# Patient Record
Sex: Male | Born: 1965
Health system: Southern US, Community
[De-identification: ages and names within clinical notes are randomized; demographics above are authoritative.]

## PROBLEM LIST (undated history)

## (undated) DIAGNOSIS — I10 Essential (primary) hypertension: Secondary | ICD-10-CM

## (undated) DIAGNOSIS — J189 Pneumonia, unspecified organism: Secondary | ICD-10-CM

## (undated) DIAGNOSIS — I499 Cardiac arrhythmia, unspecified: Secondary | ICD-10-CM

## (undated) DIAGNOSIS — I219 Acute myocardial infarction, unspecified: Secondary | ICD-10-CM

## (undated) DIAGNOSIS — E785 Hyperlipidemia, unspecified: Secondary | ICD-10-CM

## (undated) DIAGNOSIS — Z9289 Personal history of other medical treatment: Secondary | ICD-10-CM

## (undated) DIAGNOSIS — F419 Anxiety disorder, unspecified: Secondary | ICD-10-CM

## (undated) DIAGNOSIS — R569 Unspecified convulsions: Secondary | ICD-10-CM

## (undated) DIAGNOSIS — Z95810 Presence of automatic (implantable) cardiac defibrillator: Secondary | ICD-10-CM

## (undated) DIAGNOSIS — D649 Anemia, unspecified: Secondary | ICD-10-CM

## (undated) DIAGNOSIS — N189 Chronic kidney disease, unspecified: Secondary | ICD-10-CM

## (undated) DIAGNOSIS — I2510 Atherosclerotic heart disease of native coronary artery without angina pectoris: Secondary | ICD-10-CM

## (undated) DIAGNOSIS — I509 Heart failure, unspecified: Secondary | ICD-10-CM

## (undated) DIAGNOSIS — I255 Ischemic cardiomyopathy: Secondary | ICD-10-CM

## (undated) HISTORY — PX: CORONARY ANGIOPLASTY WITH STENT PLACEMENT: SHX49

---

## 2007-04-26 DIAGNOSIS — I2510 Atherosclerotic heart disease of native coronary artery without angina pectoris: Secondary | ICD-10-CM

## 2007-04-26 HISTORY — DX: Atherosclerotic heart disease of native coronary artery without angina pectoris: I25.10

## 2008-01-20 ENCOUNTER — Inpatient Hospital Stay (HOSPITAL_COMMUNITY): Admission: EM | Admit: 2008-01-20 | Discharge: 2008-01-24 | Payer: Self-pay | Admitting: Emergency Medicine

## 2008-01-22 ENCOUNTER — Encounter (INDEPENDENT_AMBULATORY_CARE_PROVIDER_SITE_OTHER): Payer: Self-pay | Admitting: Interventional Cardiology

## 2008-01-22 ENCOUNTER — Ambulatory Visit: Payer: Self-pay | Admitting: Vascular Surgery

## 2008-01-23 ENCOUNTER — Ambulatory Visit: Payer: Self-pay | Admitting: Infectious Diseases

## 2009-03-06 ENCOUNTER — Ambulatory Visit: Payer: Self-pay | Admitting: Critical Care Medicine

## 2009-03-06 ENCOUNTER — Ambulatory Visit: Payer: Self-pay | Admitting: Internal Medicine

## 2009-03-06 ENCOUNTER — Inpatient Hospital Stay (HOSPITAL_COMMUNITY): Admission: EM | Admit: 2009-03-06 | Discharge: 2009-03-09 | Payer: Self-pay | Admitting: Emergency Medicine

## 2009-09-08 ENCOUNTER — Encounter: Payer: Self-pay | Admitting: Physician Assistant

## 2010-07-28 LAB — DIFFERENTIAL
Eosinophils Absolute: 0 10*3/uL (ref 0.0–0.7)
Eosinophils Relative: 0 % (ref 0–5)
Lymphocytes Relative: 10 % — ABNORMAL LOW (ref 12–46)
Lymphs Abs: 1.6 10*3/uL (ref 0.7–4.0)
Monocytes Absolute: 0.6 10*3/uL (ref 0.1–1.0)
Monocytes Relative: 4 % (ref 3–12)

## 2010-07-28 LAB — BASIC METABOLIC PANEL
BUN: 10 mg/dL (ref 6–23)
BUN: 10 mg/dL (ref 6–23)
BUN: 12 mg/dL (ref 6–23)
CO2: 23 mEq/L (ref 19–32)
CO2: 29 mEq/L (ref 19–32)
Calcium: 8.7 mg/dL (ref 8.4–10.5)
Calcium: 8.7 mg/dL (ref 8.4–10.5)
Calcium: 9.1 mg/dL (ref 8.4–10.5)
Chloride: 103 mEq/L (ref 96–112)
Chloride: 99 mEq/L (ref 96–112)
Chloride: 99 mEq/L (ref 96–112)
Creatinine, Ser: 1.08 mg/dL (ref 0.4–1.5)
Creatinine, Ser: 1.16 mg/dL (ref 0.4–1.5)
Creatinine, Ser: 1.16 mg/dL (ref 0.4–1.5)
Creatinine, Ser: 1.33 mg/dL (ref 0.4–1.5)
GFR calc Af Amer: >60 mL/min (ref >60)
GFR calc Af Amer: >60 mL/min (ref >60)
GFR calc Af Amer: >60 mL/min (ref >60)
GFR calc non Af Amer: >60 mL/min (ref >60)
Glucose, Bld: 120 mg/dL — ABNORMAL HIGH (ref 70–99)
Glucose, Bld: 92 mg/dL (ref 70–99)
Glucose, Bld: 93 mg/dL (ref 70–99)
Potassium: 4.1 mEq/L (ref 3.5–5.1)
Potassium: 4.1 mEq/L (ref 3.5–5.1)
Sodium: 132 mEq/L — ABNORMAL LOW (ref 135–145)
Sodium: 134 mEq/L — ABNORMAL LOW (ref 135–145)
Sodium: 135 mEq/L (ref 135–145)

## 2010-07-28 LAB — POCT CARDIAC MARKERS
CKMB, poc: >80 ng/mL (ref 1.0–8.0)
Myoglobin, poc: >500 ng/mL (ref 12–200)
Myoglobin, poc: >500 ng/mL (ref 12–200)
Troponin i, poc: 10.5 ng/mL (ref 0.00–0.09)

## 2010-07-28 LAB — BLOOD GAS, ARTERIAL
O2 Saturation: 99.4 %
TCO2: 25.3 mmol/L (ref 0–100)
pCO2 arterial: 45.4 mmHg — ABNORMAL HIGH (ref 35.0–45.0)
pO2, Arterial: 222 mmHg — ABNORMAL HIGH (ref 80.0–100.0)

## 2010-07-28 LAB — CBC
HCT: 39.7 % (ref 39.0–52.0)
Hemoglobin: 14.6 g/dL (ref 13.0–17.0)
MCHC: 32.8 g/dL (ref 30.0–36.0)
MCHC: 32.8 g/dL (ref 30.0–36.0)
MCV: 82.4 fL (ref 78.0–100.0)
MCV: 82.5 fL (ref 78.0–100.0)
MCV: 83 fL (ref 78.0–100.0)
Platelets: 248 10*3/uL (ref 150–400)
Platelets: 305 10*3/uL (ref 150–400)
Platelets: 313 10*3/uL (ref 150–400)
RBC: 4.82 MIL/uL (ref 4.22–5.81)
RBC: 5.36 MIL/uL (ref 4.22–5.81)
WBC: 15.5 10*3/uL — ABNORMAL HIGH (ref 4.0–10.5)
WBC: 16.6 10*3/uL — ABNORMAL HIGH (ref 4.0–10.5)

## 2010-07-28 LAB — BENZODIAZEPINE, QUANTITATIVE, URINE: Nordiazepam GC/MS Conf: NEGATIVE

## 2010-07-28 LAB — DRUGS OF ABUSE SCREEN W/O ALC, ROUTINE URINE
Amphetamine Screen, Ur: NEGATIVE
Barbiturate Quant, Ur: NEGATIVE
Benzodiazepines.: POSITIVE — AB
Creatinine,U: 102.3 mg/dL
Methadone: NEGATIVE
Opiate Screen, Urine: POSITIVE — AB
Propoxyphene: NEGATIVE

## 2010-07-28 LAB — APTT: aPTT: 29 seconds (ref 24–37)

## 2010-07-28 LAB — CARDIAC PANEL(CRET KIN+CKTOT+MB+TROPI)
CK, MB: 118.5 ng/mL — ABNORMAL HIGH (ref 0.3–4.0)
Relative Index: 12.4 — ABNORMAL HIGH (ref 0.0–2.5)
Total CK: 2231 U/L — ABNORMAL HIGH (ref 7–232)
Total CK: 2498 U/L — ABNORMAL HIGH (ref 7–232)
Total CK: 3926 U/L — ABNORMAL HIGH (ref 7–232)
Total CK: 5655 U/L — ABNORMAL HIGH (ref 7–232)
Troponin I: >100 ng/mL (ref 0.00–0.06)
Troponin I: >100 ng/mL (ref 0.00–0.06)

## 2010-07-28 LAB — BRAIN NATRIURETIC PEPTIDE: Pro B Natriuretic peptide (BNP): 538 pg/mL — ABNORMAL HIGH (ref 0.0–100.0)

## 2010-07-28 LAB — MAGNESIUM: Magnesium: 2 mg/dL (ref 1.5–2.5)

## 2010-07-28 LAB — OPIATE, QUANTITATIVE, URINE
Oxycodone, ur: 230 ng/mL
Oxymorphone: 270 ng/mL

## 2010-07-28 LAB — PROTIME-INR: Prothrombin Time: 13.9 seconds (ref 11.6–15.2)

## 2010-07-28 LAB — GLUCOSE, CAPILLARY: Glucose-Capillary: 110 mg/dL — ABNORMAL HIGH (ref 70–99)

## 2010-07-28 LAB — RPR: RPR Ser Ql: NONREACTIVE

## 2010-09-07 NOTE — Cardiovascular Report (Signed)
NAME:  David [name], David [name] NO.:  [id number]   MEDICAL RECORD NO.:  [id number]          PATIENT TYPE:  INP   LOCATION:  2306                         FACILITY:  [facility]   PHYSICIAN:  Belva Crome, M.D.   DATE OF BIRTH:  02-13-1966   DATE OF PROCEDURE:  01/20/2008  DATE OF DISCHARGE:                            CARDIAC CATHETERIZATION   REASON FOR PROCEDURE:  Aborted anterior myocardial infarction.   PROCEDURES PERFORMED:  1. Left heart catheter.  2. Selective coronary angiography.  3. Left ventriculography.  4. Endeavor drug-eluting stents proximal and mid LAD.   DESCRIPTION:  After informed consent, the patient was brought from the  emergency room to the Cath Lab.  He began having chest discomfort in the  early part of the second half of the Sunday afternoon football game that  began at 1 o'clock.  Therefore, the chest pain started somewhere around  3:00 p.m..  When EMS arrived, he had hyperacute ST-T wave changes with  ST elevation.  The ST elevation was V1 through V4.  He was brought to  the emergency room.  He received IV nitroglycerin for aspirin and was  brought to the Cath Lab.  By the time he arrived at the Cath Lab, ST-  segment elevation had completely resolved and chest discomfort had  resolved.   We proceeded with the procedure after informed consent.  A 6-French  sheath was placed in the right femoral artery using modified Seldinger  technique.  A 6-French A2 multipurpose catheter was then used for  hemodynamic recordings, left ventriculography by hand injection, and  selective left and right coronary angiography.  We identified the LAD as  a culprit vessel with a segmental high-grade thrombus-filled stenosis in  the midvessel.  There was also an eccentric 70% stenosis in the proximal  LAD.  The second diagonal within the region of high-grade obstruction in  the mid LAD also contained 90% stenosis.  The circumflex artery  contained 50-60% mid stenosis  and there was diffuse high-grade disease  in the mid right coronary.  We had a discussion with the patient  concerning PCI versus consideration of surgery.  After some long  discussion with both the patient and his wife, we decided to proceed  with PCI.  After lengthy discussion with the patient concerning his  insurance status and ability to afford medications, we did decide to use  drug-eluting stents as the patient felt that he could afford the 75-80$  per month that it would cost to obtain Plavix.   We used bivalirudin bolus and infusion 0.75 mg/kg followed by 1.75 mg/kg  per hour.  ACT was documented to be greater than 300.  Plavix was given  late in the case.  We used a BMW wire to cross the stenosis in the LAD.  We used an XB3.5 guide catheter, 6-French, left system.  We used an  Landscape architect to enter the large second diagonal.  We performed  predilatation on the LAD with an apex 3.0 x 15 balloon.  We predilated  the second diagonal with a 2.0 x 8 mm long  apex.  We then deployed an  Endeavor 3.0 x 18 mm long drug-eluting stent in the LAD to 9  atmospheres.  We then postdilated with a 3.5 Voyager [place] within the stent  to 14 atmospheres.  We dilated both proximally and distally with a 12-mm  long balloon.  We then directly deployed a 35 x 12 mm Endeavor stent in  the proximal lesion to 14 atmospheres and postdilated it to 12  atmospheres with a 3.75 x 9 mm Vass sprinter.  The [facility] wire  that was used to protect the large second diagonal was removed before  high-pressure balloon inflation on the mid stent was performed.  We did  not protect the first diagonal and we direct stented the proximal LAD  with the Endeavor.   Angio-Seal was used for hemostasis without complications.  The patient  left the Cath Lab with no chest pain or other problems.   RESULTS:  1. Hemodynamic data:      a.     Aortic pressure 156/101.      b.     Left ventricular pressure 163/17.  2.  Left ventriculography:  The left ventricle is normal in size and      the contractility is normal.  There is mild apical hypokinesis.  EF      60%.  3. Coronary angiography.      a.     Left main coronary artery:  Widely patent.      b.     Left anterior descending coronary artery:  LAD is a large       vessel.  There is an eccentric 50% to 80% proximal stenosis after       the first septal perforator and there is a segmental 95-99%       stenosis in the mid-LAD that also encompasses a large second       diagonal that also contains a 90% ostial stenosis with thrombus.       LAD is transapical.      c.     Circumflex artery:  Circumflex is a moderate-sized vessel       that contains irregularities in the midvessel before it bifurcates       in the continuation of the circumflex and the first marginal.       This stenosis is in the 50-70% range.      d.     Right coronary artery:  There is a large vessel that       bifurcates on the inferior wall given the PDA and left ventricular       branch.  There is 60% obstruction in the proximal RCA.  There is a       long region of ectasia and high-grade disease in the mid RCA up to       95%.  4. PCI:      a.     Mid LAD 99% stenosis reduced to 0% after Endeavor stenting       as described above.  The second diagonal remained widely patent       despite being jailed.  Proximal LAD 70% stenosis reduced to 0%       after Endeavor stenting.   CONCLUSION:  1. Aborted anterior infarction with the patient's chest pain resolving      completely by the time he arrived at the Cath Lab and with      resolution of ST-segment elevation.  2. PCI in the  mid and proximal LAD within Endeavor drug-eluting stents      with excellent angiographic results as described above.  A proximal      12-mm long Endeavor stent and a mid 18-mm long Endeavor stent was      placed with reduction in stenosis to 0% with TIMI grade III flow.  3. Moderate circumflex disease and  high-grade mid right coronary      artery disease.  4. Overall normal LV function with apical hypokinesis.   PLAN:  Aspirin and Plavix for a year.  We will plan PCI on the right  coronary in a couple days.  I spoke with the patient in detail about  options of surgery versus PCI.  At his young age, he has chosen PCI.  I  also spoken at length about the need for continuous antiplatelet therapy  (dual-antiplatelet therapy) and he promises me that he believes he will  be able to afford Plavix.  We will also try to help him with samples as  much as possible.      Belva Crome, M.D.  Electronically Signed     HWS/MEDQ  D:  01/20/2008  T:  01/21/2008  Job:  [id number]

## 2010-09-07 NOTE — Cardiovascular Report (Signed)
NAME:  David [name], David [name] NO.:  [id number]   MEDICAL RECORD NO.:  [id number]          PATIENT TYPE:  INP   LOCATION:  [id number]                         FACILITY:  [facility]   PHYSICIAN:  Belva Crome, M.D.   DATE OF BIRTH:  1966-04-11   DATE OF PROCEDURE:  01/22/2008  DATE OF DISCHARGE:                            CARDIAC CATHETERIZATION   PROCEDURE PERFORMED:  Multilesion, right coronary artery percutaneous  coronary intervention with drug-eluting stent implantation.   INDICATIONS:  The patient presented with a STEMI on January 20, 2008.  Had LAD PCI.  This is staged completion of revascularization after high-  grade disease was found in the right coronary at the time of initial  presentation.   DESCRIPTION:  Because of a hematoma in the right groin, the left groin  was sterilely prepped and draped, and a 6-French sheath was placed using  the modified Seldinger technique.  Angiomax was then started, and ACT  documented greater than 300.  The patient received Plavix 300 mg last  evening in addition of 150 mg a day.  We used a #4 diagnostic left  Judkins catheter for left coronary angiography to document continued  patency of the left coronary.  We then performed PCI on the right  coronary using a Judkins right 6-French guide catheter.  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  We used intravascular ultrasound  to both determine native vessel diameter, to help with fusing proper  stent deployment diameters, and also to help assess a questionable  lesion in the mid right coronary.  After intravascular ultrasound, we  directly stented the distal lesion in the right coronary with a 12-mm  long x 3.0-mm Endeavor stent.  We postdilated with a 3.25 x 9-mm long  balloon to 14 atmospheres.  Because of IVUS findings of less than 4  mm  square diameter of the proximal lesion in the right coronary, we direct  stented this lesion with an 18-mm long x 3.5 Endeavor stent and  postdilated with a 15-mm long 3.75-mm Voyager [place] balloon to 14  atmospheres.  Because IVUS did not seem to indicate a high-grade  obstruction in the mid lesion, we did not initially stent this lesion.  We removed the wire.  We took multiple views of the right coronary and  in a far lateral LAO view, we identified a high-grade obstruction in the  mid right coronary.  We recrossed the mid right coronary with the [facility] wire and direct stented with a 9-mm long x 3.5-mm diameter  Endeavor stent.  We postdilated the stent with an 8-mm long noncompliant  balloon to 14 atmospheres, 3.75 mm in diameter.  The patient tolerated  the procedure without complications.  Postprocedure, IVUS demonstrated  good stent apposition on all 3 sites.  TIMI grade 3 flow was noted.   CONCLUSIONS:  Successful complicated right coronary multilesion stenting  with DES Endeavor stents, reducing proximal mid and distal stenoses  from  70, 80, and 95% to 0, 0, and 0% with TIMI grade 3 flow.   PLAN:  Plavix should be continued long term for greater than a year.  Aspirin should be continued.  Hopeful, discharge on January 23, 2008.     Belva Crome, M.D.  Electronically Signed    HWS/MEDQ  D:  01/22/2008  T:  01/23/2008  Job:  [id number]

## 2010-09-10 NOTE — Discharge Summary (Signed)
NAME:  David [name], David [name] NO.:  [id number]   MEDICAL RECORD NO.:  [id number]          PATIENT TYPE:  INP   LOCATION:  [id number]                         FACILITY:  [facility]   PHYSICIAN:  Belva Crome, M.D.   DATE OF BIRTH:  1965/06/05   DATE OF ADMISSION:  01/20/2008  DATE OF DISCHARGE:  01/24/2008                               DISCHARGE SUMMARY   DISCHARGE DIAGNOSES:  1. Acute anterior myocardial infarction, status post stent placement      to the mid and proximal LAD in January 20, 2008.  2. Residual coronary artery disease, status post Endeavor stent      placement x3 to the right coronary artery.  3. Dyslipidemia, on Zocor.  4. nonsustained ventricular tachycardia, treated with beta-blocker.  5. Hypertension.  6. Tobacco abuse, smoking cessation counseling provided.  7. Urinary tract infection, treated.   HOSPITAL COURSE:  [name] is a 45 year old male patient who had  sudden onset of chest pain while watching TV on January 20, 2008.  He  presented to the [facility] ER and his EKG showed hyperacute T-waves with ST-  segment elevation in the anterior leads.  The EKG changes resolved on  the way to the cath lab, but he was felt to be in acute distress.  He  was taken to the cath lab and had a proximal 80% LAD lesion as well as a  95% mid thrombotic lesion.  A proximal and a mid Endeavor stents were  placed to this area without difficulty.   The patient had residual coronary artery disease in the right coronary  artery, and on January 20, 2008, after recovery, he had 3 Endeavor  stents placed to the proximal, mid, and distal right coronary artery  lesion.   He did have some NSVT that was controlled on a beta-blocker.  His  dyslipidemia was treated with Zocor and showed a total cholesterol of  267, triglycerides 323, LDL 173, and HDL 29.   During the hospitalization, he had a fever with T-max of 101.5.  We did  ask the infectious disease team to see this patient,  Dr. Johnnye Sima saw the  patient.  Ultimately, he was diagnosed with urinary tract infection and  was treated with Cipro.   On January 24, 2008, it was felt safe for him to go home.  Lab studies  during this hospitalization include an elevated white count of 20,900,  hemoglobin 12.2, hematocrit 36.2, platelets 265, sodium 137, potassium  3.7, BUN 9, creatinine 1.05, hemoglobin A1c 5.7, maximum CK of 695 with  MB fraction of 76.9, maximum troponin of 17.03, and TSH 0.29.   DISCHARGE MEDICATIONS:  1. Plavix 75 mg a day.  2. Enteric-coated aspirin 325 mg a day.  3. Lopressor 50 mg twice a day.  4. Zocor 80 mg a day.  5. Norvasc 5 mg a day.  6. Cipro 500 mg 1 p.o. b.i.d. for 7 days.   He is to stop taking Exforge.  Clean cath site gently with soap and  water.  No scrubbing.  Remain on a low-sodium heart-healthy diet.  Activity per cardiac  rehabilitation.  No lifting over 10 pounds for 1  week.  No driving for 2 days.  Follow up with Dr. Tarri Fuller,  nurse practitioner on February 06, 2008, at 11:30 a.m.      Joesphine Bare, P.A.      Belva Crome, M.D.  Electronically Signed    LB/MEDQ  D:  03/04/2008  T:  03/05/2008  Job:  [id number]   cc:   Belva Crome, M.D.

## 2011-01-24 LAB — BASIC METABOLIC PANEL
BUN: 13
CO2: 25
CO2: 25
Calcium: 8.7
Chloride: 104
Chloride: 104
Chloride: 106
Creatinine, Ser: 1.08
GFR calc non Af Amer: >60
GFR calc non Af Amer: >60
Glucose, Bld: 104 — ABNORMAL HIGH
Glucose, Bld: 131 — ABNORMAL HIGH
Glucose, Bld: 99
Potassium: 3.4 — ABNORMAL LOW
Potassium: 4
Potassium: 4.4
Sodium: 137
Sodium: 139
Sodium: 139

## 2011-01-24 LAB — DIFFERENTIAL
Basophils Absolute: 0.1
Basophils Relative: 0
Eosinophils Absolute: 0.1
Eosinophils Relative: 1
Lymphocytes Relative: 20
Lymphs Abs: 3.2
Monocytes Absolute: 2.1 — ABNORMAL HIGH
Monocytes Relative: 6
Neutro Abs: 16.4 — ABNORMAL HIGH
Neutrophils Relative %: 78 — ABNORMAL HIGH

## 2011-01-24 LAB — CBC
HCT: 42.9
Hemoglobin: 12.7 — ABNORMAL LOW
Hemoglobin: 14
MCHC: 32.6
MCHC: 33.6
MCV: 86
Platelets: 307
RDW: 14.8
RDW: 14.9

## 2011-01-24 LAB — CARDIAC PANEL(CRET KIN+CKTOT+MB+TROPI)
Relative Index: 11.1 — ABNORMAL HIGH
Total CK: 695 — ABNORMAL HIGH
Troponin I: 17.03

## 2011-01-24 LAB — CULTURE, BLOOD (ROUTINE X 2): Culture: NO GROWTH

## 2011-01-24 LAB — CK TOTAL AND CKMB (NOT AT ARMC)
CK, MB: 76.4 — ABNORMAL HIGH
Relative Index: 11.1 — ABNORMAL HIGH
Relative Index: 11.4 — ABNORMAL HIGH
Relative Index: 7.6 — ABNORMAL HIGH
Total CK: 506 — ABNORMAL HIGH
Total CK: 687 — ABNORMAL HIGH

## 2011-01-24 LAB — URINE CULTURE

## 2011-01-24 LAB — LIPID PANEL
Cholesterol: 267 — ABNORMAL HIGH
HDL: 29 — ABNORMAL LOW
Total CHOL/HDL Ratio: 9.2
VLDL: 65 — ABNORMAL HIGH

## 2011-01-24 LAB — HEMOGLOBIN A1C: Hgb A1c MFr Bld: 5.7

## 2011-01-24 LAB — PROTIME-INR
INR: 0.9
Prothrombin Time: 12.4

## 2011-01-24 LAB — URINALYSIS, ROUTINE W REFLEX MICROSCOPIC
Bilirubin Urine: NEGATIVE
Ketones, ur: NEGATIVE
Nitrite: NEGATIVE
Urobilinogen, UA: 1

## 2011-01-24 LAB — TSH: TSH: 0.829

## 2011-01-25 LAB — BASIC METABOLIC PANEL
BUN: 9
Creatinine, Ser: 1.05
GFR calc non Af Amer: >60
Potassium: 3.7

## 2011-01-25 LAB — DIFFERENTIAL
Basophils Absolute: 0.1
Basophils Relative: 0
Eosinophils Absolute: 0.1
Eosinophils Relative: 0
Lymphocytes Relative: 10 — ABNORMAL LOW
Lymphs Abs: 2.1
Monocytes Absolute: 2.1 — ABNORMAL HIGH
Monocytes Relative: 10
Neutro Abs: 16.4 — ABNORMAL HIGH
Neutrophils Relative %: 79 — ABNORMAL HIGH

## 2011-01-25 LAB — CBC
HCT: 36.2 — ABNORMAL LOW
Platelets: 265
WBC: 20.7 — ABNORMAL HIGH

## 2011-03-13 ENCOUNTER — Ambulatory Visit (HOSPITAL_COMMUNITY): Admit: 2011-03-13 | Payer: Self-pay | Admitting: Cardiovascular Disease

## 2011-03-13 ENCOUNTER — Inpatient Hospital Stay (HOSPITAL_COMMUNITY)
Admission: EM | Admit: 2011-03-13 | Discharge: 2011-03-16 | DRG: 248 | Disposition: A | Discharge status: Home / Self Care | Payer: MEDICAID | Source: Ambulatory Visit | Attending: Interventional Cardiology | Admitting: Interventional Cardiology

## 2011-03-13 ENCOUNTER — Encounter (HOSPITAL_COMMUNITY)
Admission: EM | Disposition: A | Discharge status: Home / Self Care | Payer: Self-pay | Source: Ambulatory Visit | Attending: Interventional Cardiology

## 2011-03-13 ENCOUNTER — Other Ambulatory Visit: Payer: Self-pay

## 2011-03-13 DIAGNOSIS — T82897A Other specified complication of cardiac prosthetic devices, implants and grafts, initial encounter: Principal | ICD-10-CM | POA: Diagnosis present

## 2011-03-13 DIAGNOSIS — I2119 ST elevation (STEMI) myocardial infarction involving other coronary artery of inferior wall: Secondary | ICD-10-CM

## 2011-03-13 DIAGNOSIS — Z7982 Long term (current) use of aspirin: Secondary | ICD-10-CM

## 2011-03-13 DIAGNOSIS — I252 Old myocardial infarction: Secondary | ICD-10-CM | POA: Diagnosis present

## 2011-03-13 DIAGNOSIS — I2510 Atherosclerotic heart disease of native coronary artery without angina pectoris: Secondary | ICD-10-CM | POA: Diagnosis present

## 2011-03-13 DIAGNOSIS — I471 Supraventricular tachycardia, unspecified: Secondary | ICD-10-CM | POA: Diagnosis not present

## 2011-03-13 DIAGNOSIS — Z9119 Patient's noncompliance with other medical treatment and regimen: Secondary | ICD-10-CM

## 2011-03-13 DIAGNOSIS — I5022 Chronic systolic (congestive) heart failure: Secondary | ICD-10-CM | POA: Diagnosis present

## 2011-03-13 DIAGNOSIS — I2589 Other forms of chronic ischemic heart disease: Secondary | ICD-10-CM | POA: Diagnosis present

## 2011-03-13 DIAGNOSIS — E785 Hyperlipidemia, unspecified: Secondary | ICD-10-CM

## 2011-03-13 DIAGNOSIS — Y831 Surgical operation with implant of artificial internal device as the cause of abnormal reaction of the patient, or of later complication, without mention of misadventure at the time of the procedure: Secondary | ICD-10-CM | POA: Diagnosis present

## 2011-03-13 DIAGNOSIS — I10 Essential (primary) hypertension: Secondary | ICD-10-CM

## 2011-03-13 DIAGNOSIS — I5023 Acute on chronic systolic (congestive) heart failure: Secondary | ICD-10-CM | POA: Diagnosis present

## 2011-03-13 DIAGNOSIS — Z91199 Patient's noncompliance with other medical treatment and regimen due to unspecified reason: Secondary | ICD-10-CM

## 2011-03-13 HISTORY — DX: Hyperlipidemia, unspecified: E78.5

## 2011-03-13 HISTORY — DX: Atherosclerotic heart disease of native coronary artery without angina pectoris: I25.10

## 2011-03-13 HISTORY — DX: Essential (primary) hypertension: I10

## 2011-03-13 HISTORY — PX: LEFT HEART CATHETERIZATION WITH CORONARY ANGIOGRAM: SHX5451

## 2011-03-13 HISTORY — DX: Ischemic cardiomyopathy: I25.5

## 2011-03-13 SURGERY — LEFT HEART CATHETERIZATION WITH CORONARY ANGIOGRAM
Anesthesia: LOCAL

## 2011-03-13 MED ORDER — ATROPINE SULFATE 1 MG/ML IJ SOLN
INTRAMUSCULAR | Status: AC
  Filled 2011-03-13: qty 1

## 2011-03-13 MED ORDER — LIDOCAINE HCL (PF) 1 % IJ SOLN
INTRAMUSCULAR | Status: AC
  Filled 2011-03-13: qty 30

## 2011-03-13 MED ORDER — ACETAMINOPHEN 325 MG PO TABS: 650.0000 mg | ORAL_TABLET | ORAL | Status: DC | PRN

## 2011-03-13 MED ORDER — SODIUM CHLORIDE 0.9 % IV SOLN
INTRAVENOUS | Status: AC
  Administered 2011-03-13: via INTRAVENOUS

## 2011-03-13 MED ORDER — NITROGLYCERIN 0.2 MG/ML ON CALL CATH LAB
INTRAVENOUS | Status: AC
  Filled 2011-03-13: qty 1

## 2011-03-13 MED ORDER — ONDANSETRON HCL 4 MG/2ML IJ SOLN: 4.0000 mg | Freq: Four times a day (QID) | INTRAMUSCULAR | Status: DC | PRN

## 2011-03-13 MED ORDER — ASPIRIN EC 325 MG PO TBEC
325.0000 mg | DELAYED_RELEASE_TABLET | Freq: Every day | ORAL | Status: DC
  Administered 2011-03-14 – 2011-03-16 (×3): 325 mg via ORAL
  Filled 2011-03-13 (×3): qty 1

## 2011-03-13 MED ORDER — PRASUGREL HCL 10 MG PO TABS
10.0000 mg | ORAL_TABLET | Freq: Every day | ORAL | Status: DC
  Administered 2011-03-14 – 2011-03-16 (×3): 10 mg via ORAL
  Filled 2011-03-13 (×4): qty 1

## 2011-03-13 MED ORDER — HEPARIN (PORCINE) IN NACL 2-0.9 UNIT/ML-% IJ SOLN
INTRAMUSCULAR | Status: AC
  Filled 2011-03-13: qty 2000

## 2011-03-13 MED ORDER — PRASUGREL HCL 10 MG PO TABS
ORAL_TABLET | ORAL | Status: AC
  Administered 2011-03-14: 10 mg via ORAL
  Filled 2011-03-13: qty 6

## 2011-03-13 MED ORDER — BIVALIRUDIN 250 MG IV SOLR
INTRAVENOUS | Status: AC
  Filled 2011-03-13: qty 250

## 2011-03-13 MED ORDER — MIDAZOLAM HCL 2 MG/2ML IJ SOLN
INTRAMUSCULAR | Status: AC
  Filled 2011-03-13: qty 2

## 2011-03-13 NOTE — Cardiovascular Report (Signed)
NAME:  SQUIRE, LOSER NO.:  [id number]  MEDICAL RECORD NO.:  [id number]  LOCATION:  2905                         FACILITY:  [facility]  PHYSICIAN:  Quay Burow, M.D.   DATE OF BIRTH:  [birth year]  DATE OF PROCEDURE: DATE OF DISCHARGE:                           CARDIAC CATHETERIZATION   David [name] is a 45 year old married African American male with a history of multiple stents in the past, including LAD circumflex and RCA.  He was last cathed few years ago by myself, having inferior posterior myocardial infarction complicated by cardiogenic shock and pulmonary edema requiring intubation, sedation and paralysis.  He was complaining of chest pain since 11 o'clock this morning and presented to [facility] where he was noted to have inferior ST-segment elevation with reciprocal anterior depression.  He was brought by EMS to [facility] for cath and urgent intervention.  PROCEDURE DESCRIPTION:  The patient brought to the [facility] in the postabsorptive state.  He was premedicated with IV Versed and fentanyl.  His right groin was prepped and shaved in the usual sterile fashion.  1% Xylocaine was used for local anesthesia. A 6-French sheath was inserted into the right femoral artery using standard Seldinger technique.  A 6-French left Judkins and pigtail catheter along with a 6-French JR4 guide catheter were used for selective coronary angiography, and left ventriculography respectively. Visipaque dye was used for the entirety of the case.  Retrograde aortic, left ventricular pullback pressures were recorded.  HEMODYNAMICS:  1  Aortic systolic pressure 92, diastolic pressure is 57. 1. Systolic pressure 88, end-diastolic pressure 16.  SELECTIVE CORONARY ANGIOGRAPHY: 1. Left main normal. 2. LAD; the proximal LAD stent was widely patent.  There was a     moderate-sized diagonal branch arising from within the stent had     95% ostial stenosis  unchanged from prior cath. 3. Left circumflex; patent stent in the mid AV groove, with a 60%     stenosis just beyond this. 4. Right coronary artery; dominant with a patent proximal stent and     99% "in-stent restenosis" with a thrombotic appearance in the mid     RCA stent. 5. Left ventriculography; RAO left ventriculogram was performed using     25 mL of Visipaque dye at 12 mL/second.  The overall LVEF estimated     approximately 30% with moderate-to-severe inferobasal hypokinesia.  IMPRESSION:  David [name] suffering an acute inferior wall myocardial infarction secondary to a subtotally occluded mid dominant right coronary artery stent.  We will proceed with PCI and restenting using a bare-metal stent, Angiomax and Effient.  Patient already received aspirin as well as Effient 60 mg prior to the intervention and Angiomax bolus with an ACT of 589.  Total of 105 mL of contrast was used.  Using a  0.014 x 190 [employer]ahi soft wire, a 2.0 x 12 Emerge predilatation was performed.  Following this, expressway aspiration thrombectomy was performed followed by a restenting using a 2.75 x 15 Vision bare-metal stent at 16 atmospheres.  Postdilatation was performed with a 3.0 x 12 [place] Quantum apex at 16 atmospheres (3.1 mm) resulting reduction of a subtotally occluded stent to 0%  residual TIMI-3 flow.  Patient's pain resolved.  Total balloon time was 24 minutes.  The patient tolerated the procedure well.  IMPRESSION:  Successful PCI and restenting of a subtotally occluded mid right coronary artery stent.  The patient will be treated with standard medications including aspirin, Effient (allergic to PLAVIX), beta blocker, statin and ACE inhibitor.  Sheath will be removed.  Angiomax will continue at reduced dose because of the thrombotic nature of the lesion.  The patient will be fast-tracked and anticipate discharge in 48 hours.  The patient left lab in stable condition.     Quay Burow,  M.D.     JB/MEDQ  D:  03/13/2011  T:  03/13/2011  Job:  [id number]  cc:   [facility] R. Revankar, M.D.

## 2011-03-13 NOTE — Procedures (Signed)
Dictation# [id number]  David [name] 03/13/2011 11:22 PM

## 2011-03-14 ENCOUNTER — Inpatient Hospital Stay (HOSPITAL_COMMUNITY): Payer: Self-pay

## 2011-03-14 ENCOUNTER — Encounter (HOSPITAL_COMMUNITY): Payer: Self-pay | Admitting: Internal Medicine

## 2011-03-14 DIAGNOSIS — I252 Old myocardial infarction: Secondary | ICD-10-CM | POA: Diagnosis present

## 2011-03-14 HISTORY — DX: Old myocardial infarction: I25.2

## 2011-03-14 LAB — BASIC METABOLIC PANEL
BUN: 11 mg/dL (ref 6–23)
Calcium: 8.6 mg/dL (ref 8.4–10.5)
Creatinine, Ser: 0.87 mg/dL (ref 0.50–1.35)
GFR calc Af Amer: >90 mL/min (ref >90)
GFR calc non Af Amer: >90 mL/min (ref >90)
Potassium: 3.9 mEq/L (ref 3.5–5.1)

## 2011-03-14 LAB — COMPREHENSIVE METABOLIC PANEL
ALT: 22 U/L (ref 0–53)
Alkaline Phosphatase: 72 U/L (ref 39–117)
CO2: 23 mEq/L (ref 19–32)
Chloride: 103 mEq/L (ref 96–112)
GFR calc Af Amer: >90 mL/min (ref >90)
GFR calc non Af Amer: >90 mL/min (ref >90)
Glucose, Bld: 107 mg/dL — ABNORMAL HIGH (ref 70–99)
Potassium: 3.9 mEq/L (ref 3.5–5.1)
Sodium: 137 mEq/L (ref 135–145)
Total Bilirubin: 0.2 mg/dL — ABNORMAL LOW (ref 0.3–1.2)

## 2011-03-14 LAB — CARDIAC PANEL(CRET KIN+CKTOT+MB+TROPI)
CK, MB: 128.9 ng/mL (ref 0.3–4.0)
CK, MB: 154.4 ng/mL (ref 0.3–4.0)
Total CK: 1904 U/L — ABNORMAL HIGH (ref 7–232)
Total CK: 1559 U/L — ABNORMAL HIGH (ref 7–232)
Troponin I: >25 ng/mL (ref <0.30)

## 2011-03-14 LAB — CBC
HCT: 42.9 % (ref 39.0–52.0)
MCH: 28 pg (ref 26.0–34.0)
MCHC: 33.8 g/dL (ref 30.0–36.0)
MCV: 80.8 fL (ref 78.0–100.0)
MCV: 81.1 fL (ref 78.0–100.0)
Platelets: 213 10*3/uL (ref 150–400)
RDW: 15 % (ref 11.5–15.5)
RDW: 15.1 % (ref 11.5–15.5)

## 2011-03-14 LAB — DIFFERENTIAL
Basophils Absolute: 0 10*3/uL (ref 0.0–0.1)
Eosinophils Absolute: 0.2 10*3/uL (ref 0.0–0.7)
Eosinophils Relative: 2 % (ref 0–5)
Monocytes Absolute: 1 10*3/uL (ref 0.1–1.0)

## 2011-03-14 LAB — DIGOXIN LEVEL: Digoxin Level: 0.3 ng/mL — ABNORMAL LOW (ref 0.8–2.0)

## 2011-03-14 LAB — MRSA PCR SCREENING: MRSA by PCR: NEGATIVE

## 2011-03-14 MED ORDER — LISINOPRIL 5 MG PO TABS
5.0000 mg | ORAL_TABLET | Freq: Every day | ORAL | Status: DC
  Administered 2011-03-14 – 2011-03-16 (×3): 5 mg via ORAL
  Filled 2011-03-14 (×3): qty 1

## 2011-03-14 MED ORDER — ATROPINE SULFATE 1 MG/ML IJ SOLN
INTRAMUSCULAR | Status: AC
  Filled 2011-03-14: qty 1

## 2011-03-14 MED ORDER — SODIUM CHLORIDE 0.9 % IV SOLN
1.7500 mg/kg/h | INTRAVENOUS | Status: AC
  Filled 2011-03-14: qty 250

## 2011-03-14 MED ORDER — CARVEDILOL 6.25 MG PO TABS
6.2500 mg | ORAL_TABLET | Freq: Two times a day (BID) | ORAL | Status: DC
  Administered 2011-03-14 – 2011-03-16 (×5): 6.25 mg via ORAL
  Filled 2011-03-14 (×8): qty 1

## 2011-03-14 MED ORDER — FUROSEMIDE 40 MG PO TABS
40.0000 mg | ORAL_TABLET | Freq: Two times a day (BID) | ORAL | Status: DC
  Administered 2011-03-14 – 2011-03-15 (×3): 40 mg via ORAL
  Filled 2011-03-14 (×4): qty 1

## 2011-03-14 MED ORDER — NITROGLYCERIN 0.4 MG SL SUBL: 0.4000 mg | SUBLINGUAL_TABLET | SUBLINGUAL | Status: DC | PRN

## 2011-03-14 MED ORDER — DIGOXIN 125 MCG PO TABS
125.0000 ug | ORAL_TABLET | Freq: Every day | ORAL | Status: DC
  Filled 2011-03-14: qty 1

## 2011-03-14 MED ORDER — SIMVASTATIN 40 MG PO TABS
40.0000 mg | ORAL_TABLET | Freq: Every day | ORAL | Status: DC
  Administered 2011-03-14 – 2011-03-15 (×2): 40 mg via ORAL
  Filled 2011-03-14 (×3): qty 1

## 2011-03-14 MED ORDER — POTASSIUM CHLORIDE CRYS ER 20 MEQ PO TBCR
20.0000 meq | EXTENDED_RELEASE_TABLET | Freq: Every day | ORAL | Status: DC
  Administered 2011-03-14 – 2011-03-16 (×3): 20 meq via ORAL
  Filled 2011-03-14 (×3): qty 1

## 2011-03-14 MED ORDER — FUROSEMIDE 40 MG PO TABS: 40.0000 mg | ORAL_TABLET | Freq: Two times a day (BID) | ORAL | Status: DC

## 2011-03-14 MED ORDER — ISOSORBIDE MONONITRATE ER 30 MG PO TB24
30.0000 mg | ORAL_TABLET | Freq: Every day | ORAL | Status: DC
  Administered 2011-03-14 – 2011-03-16 (×3): 30 mg via ORAL
  Filled 2011-03-14 (×3): qty 1

## 2011-03-14 MED FILL — Dextrose Inj 5%: INTRAVENOUS | Qty: 50 | Status: AC

## 2011-03-14 NOTE — H&P (Signed)
Cardiology H&P  Primary Care Povider: Jenean Lindau Primary Cardiologist: Pernell Dupre   HPI: Mr. Demetro is a 45 y.o.male with CAD and prior AMI with multiple PCI as below who presents from [place] with inferior STEMI.  Pt reports brief episode of chest pain while watching football yesterday.  The pain began again this AM and persisted prompting him to present to the ER.  He reports SOB and nausea as well.  He had no arrhythmias.  Upon arrival his pain was gone.  He was taken to the cath lab and was found to have in-stent lesion in his RCA with significant thrombus.  Dr. Gwenlyn Found placed a BMS in his mRCA.  The patient has an allergy to plavix (rash).  After his last intervention in 2010 he took effient for 1 month.  He reports only taking aspirin intermittently up to 3 x per week.   Past Medical History  Diagnosis Date  . CAD (coronary artery disease) 2009    AMI in 12/2007 with PCI to LAD, staged PCI to  mid/distal RCA, NSTEMI in 02/2009 with BMS to LCx  . Ischemic cardiomyopathy     Admitted in 07/2010 with CHF exacerbation  . HTN (hypertension)   . HLD (hyperlipidemia)     History reviewed. No pertinent past surgical history.  Family History  Problem Relation Age of Onset  . Diabetes    . Hypertension    . Coronary artery disease    . Coronary artery disease Father   . Hypertension Father   . Hypertension Sister   . Coronary artery disease Brother   . Emphysema Brother   . Coronary artery disease Mother   . Hypertension Mother   . Emphysema Mother   . Hypertension Daughter   . Obesity Daughter     Social History:  reports that he quit smoking about 3 months ago. His smoking use included Cigarettes. He quit after 30 years of use. He does not have any smokeless tobacco history on file. He reports that he does not drink alcohol or use illicit drugs.  Allergies:  Allergies  Allergen Reactions  . Plavix (Clopidogrel Bisulfate) Hives    Current Facility-Administered  Medications  Medication Dose Route Frequency Provider Last Rate Last Dose  . 0.9 %  sodium chloride infusion   Intravenous Continuous Lorretta Harp, MD 50 mL/hr at 03/14/11 0000    . acetaminophen (TYLENOL) tablet 650 mg  650 mg Oral Q4H PRN Lorretta Harp, MD      . aspirin EC tablet 325 mg  325 mg Oral Daily Lorretta Harp, MD      . atropine 1 MG/ML injection           . bivalirudin (ANGIOMAX) 250 MG injection           . bivalirudin (ANGIOMAX) 5 mg/mL in sodium chloride 0.9 % 50 mL infusion  1.75 mg/kg/hr Intravenous Continuous Juanda Chance Amend, PHARMD   0.237 mg/kg/hr at 03/14/11 0000  . carvedilol (COREG) tablet 6.25 mg  6.25 mg Oral BID WC Aletta Edouard      . digoxin (LANOXIN) tablet 125 mcg  125 mcg Oral Daily Aletta Edouard      . furosemide (LASIX) tablet 40 mg  40 mg Oral BID Aletta Edouard      . heparin 2-0.9 UNIT/ML-% infusion           . isosorbide mononitrate (IMDUR) 24 hr tablet 30 mg  30 mg Oral Daily Aletta Edouard      .  lidocaine (XYLOCAINE) 1 % injection           . lisinopril (PRINIVIL,ZESTRIL) tablet 5 mg  5 mg Oral Daily Aletta Edouard      . midazolam (VERSED) 2 MG/2ML injection           . nitroGLYCERIN (NITROSTAT) SL tablet 0.4 mg  0.4 mg Sublingual Q5 min PRN Aletta Edouard      . nitroGLYCERIN (NTG ON-CALL) 0.2 mg/mL injection           . ondansetron (ZOFRAN) injection 4 mg  4 mg Intravenous Q6H PRN Lorretta Harp, MD      . potassium chloride SA (K-DUR,KLOR-CON) CR tablet 20 mEq  20 mEq Oral Daily Aletta Edouard      . prasugrel (EFFIENT) 10 MG tablet           . prasugrel (EFFIENT) tablet 10 mg  10 mg Oral Daily Lorretta Harp, MD      . simvastatin (ZOCOR) tablet 40 mg  40 mg Oral q1800 Aletta Edouard      . DISCONTD: furosemide (LASIX) tablet 40 mg  40 mg Oral BID Aletta Edouard        ROS: A full review of systems is obtained and is negative except as noted in the HPI.  Physical Exam: Blood pressure 102/65, pulse 74, temperature 98.2 F  (36.8 C), temperature source Oral, resp. rate 16, height 5\' 5"  (1.651 m), weight 73.7 kg (162 lb 7.7 oz), SpO2 100.00%.  GENERAL: no acute distress.  EYES: Extra ocular movements are intact. There is no lid lag. Sclera is anicteric.  ENT: Oropharynx is clear. Dentition is within normal limits.  NECK: Supple. The thyroid is not enlarged.  LYMPH: There are no masses or lymphadenopathy present.  HEART: Regular rate and rhythm with no m/g/r.  Normal S1/S2. No JVD LUNGS: Clear to auscultation There are no rales, rhonchi, or wheezes.  ABDOMEN: Soft, non-tender, and non-distended with normoactive bowel sounds. There is no hepatosplenomegaly.  EXTREMITIES: No clubbing, cyanosis, or edema.  PULSES: Right fem sheath in place.DP/PT pulses were +2 and equal bilaterally.  SKIN: Warm, dry, and intact.  NEUROLOGIC: The patient was oriented to person, place, and time. No overt neurologic deficits were detected.  PSYCH: Normal judgment and insight, mood is appropriate.   Results:  CXR: pending   EKG: from EMS, NSR with inferior/post STEMI, post PCI EKG shows improvement in ST segments  Assessment/Plan: 45 yo AAM with ischemic CM presenting with inferior STEMI.  He is now s/p thrombectomy and BMS to [facility] in-stent lesion. 1. CAD/STEMI - ASA 325 and effient 10 daily, counseling on taking his antiplt therapy daily (only taking ASA 3x/week at home) - continuing bivalarudin x 3 hrs per Dr. Gwenlyn Found, sheath pull 2hrs after bival off - start statin and check lipids - continue coreg and acei - baseline labs pending - quit smoking in 11/2010  2. Ischemic CM:  - continue CHF Rx with coreg, lisinopril, digoxin and lasix - check BNP and digoxin level - LVEF 30% by LV gram, similar to previous - will need ICD eval in a few months as outpatient  Abdirizak Richison 03/14/2011, 1:32 AM

## 2011-03-14 NOTE — Progress Notes (Signed)
Pt admitted as tx from [facility] with stemi. Pt is from home with wife and has a daughter. Pt states he has medicaid, however last date of use Oct. 1. CM called [facility] and they will send bill to medicaid. Pt had to meet $5500 deductible. Pt may be d/c on effient or plavix. Unsure of which one at this point. If home on effient CM will provide pt with 30 day free card. CM did call to price plavix and it is 29.06 at [facility] in [place] will continue to f/u for additional d/c needs. If home on effient CM will give pt a 30 day free card. MD if pt is d/c on effient, please write rx for 30 day free no refills and original rx with refills. MD please also remember that effient is not on the medicaid preferred list. If pt to d/c on effient-please call [phone] for prior authorization. Thanks Bethena Roys, [place] 03-14-11 1213.

## 2011-03-14 NOTE — Progress Notes (Signed)
CARDIAC REHAB PHASE I   PRE:  Rate/Rhythm: 88 SR  BP:  Supine:   Sitting: 97/55  Standing:    SaO2:   MODE:  Ambulation: 700 ft   POST:  Rate/Rhythem: 90 SR  BP:  Supine:   Sitting: 101/61  Standing:    SaO2:  1415-1525 Pt. tolerated ambulation well without c/o of cp or SOB. Gait steady. Completed MI education with pt. He declines Outpt. CRP not interested and worried about cost. To recliner after walk with call light in reach.  Deon Pilling

## 2011-03-14 NOTE — Progress Notes (Signed)
CRITICAL VALUE ALERT  Critical value received: CKMB 128.9 and Tropinin >25 Date of notification:  03/14/2011 Time of notification:  0253 Critical value read back:yes  Nurse who received alert:  Loma Boston, RN MD notified (1st page):  Dr. Melina Copa  Time of first page:  0312  MD notified (2nd page):  Time of second page:  Responding MD: Dr. Melina Copa  Time MD responded:  [phone]

## 2011-03-14 NOTE — Progress Notes (Signed)
Sheath Removal Note 0325:  6 FR R Femoral Sheath removed 2 hours after Angiomax infusion completed per order.  Patient's creatinine clearance is 89.  2 nurses at bedside with Atropine at bedside.  Sheath Dc'd intact, manual pressure held at site for 30 minutes.  Patient tolerated well without c/o chest pain or nausea.  Patient hemodynamically stable throughout period of manual pressure.  Hemostasis obtained at 0355.  No evidence of hematoma with 1+ pedal and posterior tibia pulses.  Pressure dressing applied at site.  Patient instructed to call RN if bleeding/swelling/pain occurs at R. Groin site. Juanell Fairly [facility] 03/14/2011 [id number]

## 2011-03-14 NOTE — Progress Notes (Signed)
Patient Name: David [name] Date of Encounter: 03/14/2011    SUBJECTIVE: The patient states that on each of the past 2 days before admission he would have a twinge of discomfort in the chest that'll resolve spontaneously. Last evening, around 7, the discomfort started, radiated to the left arm, and was unrelenting. EKGs demonstrated inferior ST elevation. By the time he arrived in the cath lab the chest discomfort had resolved. He has been off of dual antiplatelet therapy for greater than 12 months. Have not seen him in the office for greater than 12 months. No heart failure symptoms. Exertional tolerance has been excellent.  TELEMETRY:  No significant ectopy: Filed Vitals:   03/14/11 0445 03/14/11 0500 03/14/11 0600 03/14/11 0751  BP: 102/69 114/68 108/70   Pulse: 62 67 63   Temp:    98.3 F (36.8 C)  TempSrc:    Oral  Resp: 15 18 15    Height:      Weight:      SpO2: 100% 99% 100%     Intake/Output Summary (Last 24 hours) at 03/14/11 0917 Last data filed at 03/14/11 0400  Gross per 24 hour  Intake    350 ml  Output      0 ml  Net    350 ml    LABS: Basic Metabolic Panel:  Basename 03/14/11 0330 03/14/11 0127  NA 137 137  K 3.9 3.9  CL 104 103  CO2 24 23  GLUCOSE 103* 107*  BUN 11 11  CREATININE 0.87 0.90  CALCIUM 8.6 8.8  MG -- --  PHOS -- --   CBC:  Basename 03/14/11 0330 03/14/11 0127  WBC 10.0 10.7*  NEUTROABS -- 6.8  HGB 14.5 14.7  HCT 42.9 42.4  MCV 81.1 80.8  PLT 211 213   Cardiac Enzymes:  Basename 03/14/11 0525 03/14/11 0127  CKTOTAL 1904* 1803*  CKMB 154.4* 128.9*  CKMBINDEX -- --  TROPONINI >25.00* >25.00*   BNP: No results found for this basename: POCBNP:3 in the last 72 hours Hemoglobin A1C: No results found for this basename: HGBA1C in the last 72 hours Fasting Lipid Panel: No results found for this basename: CHOL,HDL,LDLCALC,TRIG,CHOLHDL,LDLDIRECT in the last 72 hours  Radiology/Studies:  Chest x-ray reveals no evidence of heart  failure  Physical Exam: Blood pressure 108/70, pulse 63, temperature 98.3 F (36.8 C), temperature source Oral, resp. rate 15, height 5\' 5"  (1.651 m), weight 73.7 kg (162 lb 7.7 oz), SpO2 100.00%. Weight change:    An S4 gallop is heard on auscultation. No significant JVD is noted. The patient is laying flat in bed. The abdomen is soft. The right groin cath site is unremarkable.  ASSESSMENT:  1. Acute inferior ST elevation myocardial infarction secondary to late stent thrombosis.  2. Health care noncompliance due to social economic issues  3. Left ventricular dysfunction, EF by cath proximally 40%, with no evidence of decompensated systolic heart failure.   Plan:  1. Discontinue digoxin  2. Phase I cardiac rehabilitation  3. The patient is currently on Effient.  I will eventually transition him to Clopidegril and do a P2 Y12 assay. The issue with Mr. Marcus is his ability to afford medications.  David [name] 03/14/2011, 9:17 AM

## 2011-03-14 NOTE — Cardiovascular Report (Signed)
NAME:  David [name], David [name] NO.:  [id number]  MEDICAL RECORD NO.:  [id number]  LOCATION:  2905                         FACILITY:  [facility]  PHYSICIAN:  Quay Burow, M.D.   DATE OF BIRTH:  1966-03-21  DATE OF PROCEDURE: DATE OF DISCHARGE:                           CARDIAC CATHETERIZATION   [name] is a 45 year old married African American male with history of multiple stents in the past, ischemic cardiomyopathy.  He was cathed last by myself 2 years ago, found to have an occluded circumflex.  He was in cardiogenic shock and pulmonary edema at that time.  He has had chest pain since 11 o'clock this morning, and presented to [facility] where he was found to have inferior ST-segment elevation myocardial infarction.  He was transported to [facility] for further evaluation.  The patient was brought to the [facility] urgently.  His right groin was prepped and shaved in usual sterile fashion.  A 1% Xylocaine was used for local anesthesia.  A 6-French sheath was inserted into the right femoral artery using standard Seldinger technique.  A 6-French right and left Judkins diagnostic catheter with a 6-French pigtail catheter were used for selective coronary angiography, left ventriculography respectively.  Visipaque dye was used for the entirety of the case, total of 105 mL (retrograde aorta, left ventricular pullback pressures were recorded).  HEMODYNAMICS: 1. Aortic systolic pressure 92, diastolic pressure 57. 2. Left ventricular systolic pressure 88, end-diastolic pressure 16.  SELECTIVE CORONARY ANGIOGRAPHY: 1. Left main normal. 2. LAD; patent proximal stent. 3. Left circumflex; patent mid AV groove stent. 4. Right coronary artery; culprit vessel with patent proximal stent,     subtotally occluded mid stent. 5. Left ventriculography; RAO left ventriculogram was performed using     25 mL of Visipaque dye at 12 mL/second.  Overall LVEF  is estimated     at 30% with moderate-to-severe inferobasal hypokinesia.  IMPRESSION:  [name] is suffering an acute inferior wall myocardial infarction.  We will proceed with percutaneous coronary intervention and re-stenting using bare-metal stent, Angiomax, and Effient.  The patient is allergic to PLAVIX.  Using a 6-French JR4 guide catheter along with 0.014 x 190 Asahi soft wire, a 2.0 x 12 Emerge predilatation was performed.  Aspiration thrombectomy was performed with Expressway aspiration catheter, re- stenting with a 2.75 x 15 Vision bare-metal stent at 16 atmospheres. Postdilatation was performed with a 3.0 x 12 [place] Quantum at 16 atmospheres (3.1 mm) resulting in reduction of 99% mid right coronary artery "in-stent restenosis to 0% residual."  Total balloon time 24 minutes.  Successful percutaneous coronary intervention and re-stenting of a subtotally occluded dominant mid right coronary artery with a bare-metal stent.  The patient will be treated with standard medications and fast- tracked with anticipated discharge in 48 hours.  He left lab in stable condition.     Quay Burow, M.D.     JB/MEDQ  D:  03/13/2011  T:  03/14/2011  Job:  [id number]  cc:   [facility] Franklin R. Revankar, M.D.

## 2011-03-14 NOTE — Progress Notes (Signed)
Chaplain's Note: Received referral from adjunct chaplain to care for family during Cath procedure.  Family had previous experince (2+ yrs ago) of being overlooked by staff and waited a long time for news of pt.  Chaplain introduced self to pt family in waiting rm and provided pastoral care and support during waiting time.  [place] stories of pt's health.  Pt had 10 family members there.  Guided family to 2900 area after Dr consult, and assisted with initial family visits to room.  Will follow-up as needed or requested.  [phone]

## 2011-03-15 ENCOUNTER — Encounter (HOSPITAL_COMMUNITY): Payer: Self-pay

## 2011-03-15 ENCOUNTER — Other Ambulatory Visit: Payer: Self-pay

## 2011-03-15 DIAGNOSIS — I2510 Atherosclerotic heart disease of native coronary artery without angina pectoris: Secondary | ICD-10-CM | POA: Diagnosis present

## 2011-03-15 DIAGNOSIS — E785 Hyperlipidemia, unspecified: Secondary | ICD-10-CM

## 2011-03-15 DIAGNOSIS — I5023 Acute on chronic systolic (congestive) heart failure: Secondary | ICD-10-CM | POA: Diagnosis present

## 2011-03-15 DIAGNOSIS — I10 Essential (primary) hypertension: Secondary | ICD-10-CM

## 2011-03-15 DIAGNOSIS — I5022 Chronic systolic (congestive) heart failure: Secondary | ICD-10-CM | POA: Diagnosis present

## 2011-03-15 HISTORY — DX: Hyperlipidemia, unspecified: E78.5

## 2011-03-15 LAB — BASIC METABOLIC PANEL
BUN: 11 mg/dL (ref 6–23)
Calcium: 9 mg/dL (ref 8.4–10.5)
Creatinine, Ser: 1 mg/dL (ref 0.50–1.35)
GFR calc non Af Amer: 89 mL/min — ABNORMAL LOW (ref >90)
Glucose, Bld: 92 mg/dL (ref 70–99)
Potassium: 3.9 mEq/L (ref 3.5–5.1)

## 2011-03-15 MED ORDER — FUROSEMIDE 40 MG PO TABS
40.0000 mg | ORAL_TABLET | Freq: Every day | ORAL | Status: DC
  Administered 2011-03-15 – 2011-03-16 (×2): 40 mg via ORAL
  Filled 2011-03-15 (×2): qty 1

## 2011-03-15 NOTE — Progress Notes (Signed)
Pt is ambulating independently, multiple laps per RN.  Reviewed CHF with pt.  He seems very knowledgeable of his care.  Congratulated pt on quitting smoking.  Not interested in CRPII.  Pt feels he can get back to his own ex program.  0950-1010  [place], ACSM

## 2011-03-15 NOTE — Progress Notes (Signed)
Patient Name: David [name] Date of Encounter: 03/15/2011    SUBJECTIVE: There is no chest discomfort. He denies dyspnea.  TELEMETRY:  No ectopy per: Filed Vitals:   03/15/11 0400 03/15/11 0430 03/15/11 0745 03/15/11 0749  BP:  114/67 136/90   Pulse:      Temp: 98 F (36.7 C)   98.8 F (37.1 C)  TempSrc: Oral   Oral  Resp:  16    Height:      Weight: 70.9 kg (156 lb 4.9 oz)     SpO2:  98%      Intake/Output Summary (Last 24 hours) at 03/15/11 0832 Last data filed at 03/15/11 0700  Gross per 24 hour  Intake   1940 ml  Output   2875 ml  Net   -935 ml    LABS: Basic Metabolic Panel:  Basename 03/15/11 0530 03/14/11 0330  NA 138 137  K 3.9 3.9  CL 101 104  CO2 29 24  GLUCOSE 92 103*  BUN 11 11  CREATININE 1.00 0.87  CALCIUM 9.0 8.6  MG -- --  PHOS -- --   CBC:  Basename 03/14/11 0330 03/14/11 0127  WBC 10.0 10.7*  NEUTROABS -- 6.8  HGB 14.5 14.7  HCT 42.9 42.4  MCV 81.1 80.8  PLT 211 213   Cardiac Enzymes:  Basename 03/14/11 1050 03/14/11 0525 03/14/11 0127  CKTOTAL 1559* 1904* 1803*  CKMB 111.9* 154.4* 128.9*  CKMBINDEX -- -- --  TROPONINI >25.00* >25.00* >25.00*   BNP:  Basename 03/15/11 0500  POCBNP 975.4*   Hemoglobin A1C:  Basename 03/14/11 0127  HGBA1C 5.7*   Fasting Lipid Panel: No results found for this basename: CHOL,HDL,LDLCALC,TRIG,CHOLHDL,LDLDIRECT in the last 72 hours  Radiology/Studies:  No new studies.  Physical Exam: Blood pressure 136/90, pulse 65, temperature 98.8 F (37.1 C), temperature source Oral, resp. rate 16, height 5\' 5"  (1.651 m), weight 70.9 kg (156 lb 4.9 oz), SpO2 98.00%. Weight change: -2.8 kg (-6 lb 2.8 oz)   S4 gallop. Lungs clear. No JVD. Cath site unremarkable per  ASSESSMENT:  1. Inferior ST elevation microinfarction do to stent thrombosis. The thrombosed stent is greater than 2 years old. The patient has long been of dual antiplatelet therapy.  2. Left ventricle dysfunction without overt heart  failure.  3. Hypertension with inadequate therapy due to social economic factors  4. Hyperlipidemia on therapy.  Plan:  1. Transfer to telemetry  2. Anticipate discharge on 03/16/2011.  3. Phase II and phase I cardiac rehabilitation will be recommended to  David [name] 03/15/2011, 8:32 AM

## 2011-03-16 ENCOUNTER — Other Ambulatory Visit: Payer: Self-pay

## 2011-03-16 ENCOUNTER — Encounter (HOSPITAL_COMMUNITY): Payer: Self-pay | Admitting: Dietician

## 2011-03-16 MED ORDER — PRASUGREL HCL 10 MG PO TABS: 10.0000 mg | ORAL_TABLET | Freq: Every day | ORAL | Status: DC

## 2011-03-16 MED ORDER — ASPIRIN 325 MG PO TBEC: 325.0000 mg | DELAYED_RELEASE_TABLET | Freq: Every day | ORAL | Status: AC

## 2011-03-16 MED ORDER — CARVEDILOL 12.5 MG PO TABS
12.5000 mg | ORAL_TABLET | Freq: Two times a day (BID) | ORAL | Status: DC
  Filled 2011-03-16 (×2): qty 1

## 2011-03-16 MED ORDER — NITROGLYCERIN 0.4 MG SL SUBL: 0.4000 mg | SUBLINGUAL_TABLET | SUBLINGUAL | Status: DC | PRN

## 2011-03-16 MED ORDER — SIMVASTATIN 40 MG PO TABS: 40.0000 mg | ORAL_TABLET | Freq: Every day | ORAL | Status: DC

## 2011-03-16 MED ORDER — LISINOPRIL 5 MG PO TABS: 5.0000 mg | ORAL_TABLET | Freq: Every day | ORAL | Status: DC

## 2011-03-16 MED ORDER — CARVEDILOL 12.5 MG PO TABS: 12.5000 mg | ORAL_TABLET | Freq: Two times a day (BID) | ORAL | Status: DC

## 2011-03-16 NOTE — Progress Notes (Signed)
Pt did have a 10 beat run of v tach this am and Dr. Tamala Julian was notified and strip on chart

## 2011-03-16 NOTE — Progress Notes (Signed)
Pt ambulated in hallway 550 feet this morning. Gait steady. Pt tolerated well.  Vella Raring, RN 03/16/2011  6:32 AM

## 2011-03-16 NOTE — Consult Note (Signed)
Pt has quit smoking cigarettes and is currently smoking 3 black and mild cigars. He refuses counseling and education. States that his Black & mild cigars are the kind with no nicotine in them. Informed pt there is no tobacco product without nicotine in it. Pt in pre-contemplation stage.

## 2011-03-16 NOTE — Progress Notes (Signed)
Utilization review completed. Rozanna Boer, RN, BSN. 03/16/11

## 2011-03-16 NOTE — Progress Notes (Signed)
CM will provide pt with 30 day free effient card. Plans for pt to d/c home today. MD please see sticky note. Thanks. Bethena Roys, [place] 03-16-11 1030.

## 2011-03-16 NOTE — Discharge Summary (Signed)
Patient ID: David [name] MRN: [id number] DOB/AGE: 1966/03/10 45 y.o.  Admit date: 03/13/2011 Discharge date: 03/16/2011  Primary Discharge Diagnosis: 1. Acute inferior myocardial infarction do to stent thrombosis/restenosis Secondary Discharge Diagnosis: 1. Compensated systolic heart failure  2. Hyperlipidemia currently not being treated on admission  3. Hypertension  4. Nonsustained ventricular tachycardia, treated with increase in beta blocker dose   Significant Diagnostic Studies: Cardiac catheterization with a bare-metal stent implantation by Dr. Quay Burow on admission, 11/ 18/2012  Consults: none  Hospital Course: The patient was admitted having substernal chest discomfort and burning in his throat. Inferior ST elevation was noted. Code STEMI protocol was activated. He was taken to the cath lab where the right coronary was noted to be subtotally occluded with TIMI grade 2 flow. Angioplasty and restenting of the mid right coronary stent with a bare-metal device was performed by Dr. Alvester Chou.  No associated post MI complications occurred other than an 8 beat run of nonsustained VT on the day of discharge. The beta blocker dose intensity was increased. He tolerated this increased dose without problems.  Had a long discussion with the patient. Part of his problem is inability to followup with his cardiologist. He had been out of some of his medications before admission to the hospital because he had not seen Dr. Cherrie Gauze in a timely fashion . He had been previously discharged from our practice due to finances. He was admitted to my service because I was his last [place].    Discharge Exam:  Lungs are clear. S4 gallop the sternal auscultation. No JVD is noted. Extremities reveal no edema. Labs:   Lab Results  Component Value Date   WBC 10.0 03/14/2011   HGB 14.5 03/14/2011   HCT 42.9 03/14/2011   MCV 81.1 03/14/2011   PLT 211 03/14/2011    Lab 03/15/11 0530  03/14/11 0127  NA 138 --  K 3.9 --  CL 101 --  CO2 29 --  BUN 11 --  CREATININE 1.00 --  CALCIUM 9.0 --  PROT -- 6.5  BILITOT -- 0.2*  ALKPHOS -- 72  ALT -- 22  AST -- 145*  GLUCOSE 92 --   Lab Results  Component Value Date   CKTOTAL 1559* 03/14/2011   CKMB 111.9* 03/14/2011   TROPONINI >25.00* 03/14/2011    Lab Results  Component Value Date   CHOL  Value: 267        ATP III CLASSIFICATION:  <200     mg/dL   Desirable  200-239  mg/dL   Borderline High  >=240    mg/dL   High* 01/20/2008   Lab Results  Component Value Date   HDL 29* 01/20/2008   Lab Results  Component Value Date   LDLCALC  Value: 173        Total Cholesterol/HDL:CHD Risk Coronary Heart Disease Risk Table                     Men   Women  1/2 Average Risk   3.4   3.3* 01/20/2008   Lab Results  Component Value Date   TRIG 323* 01/20/2008   Lab Results  Component Value Date   CHOLHDL 9.2 01/20/2008   No results found for this basename: LDLDIRECT      Radiology: No active disease on 03/14/2011   EKG: inferior Q waves with T-wave inversion. No acute ST abnormality on 03/14/2011   FOLLOW UP PLANS AND APPOINTMENTS  Current Discharge Medication List  START taking these medications   Details  aspirin EC 325 MG EC tablet Take 1 tablet (325 mg total) by mouth daily. Qty: 30 tablet    nitroGLYCERIN (NITROSTAT) 0.4 MG SL tablet Place 1 tablet (0.4 mg total) under the tongue every 5 (five) minutes as needed for chest pain. Qty: 25 tablet, Refills: 3    prasugrel (EFFIENT) 10 MG TABS Take 1 tablet (10 mg total) by mouth daily. Qty: 30 tablet, Refills: 11    simvastatin (ZOCOR) 40 MG tablet Take 1 tablet (40 mg total) by mouth daily at 6 PM. Qty: 30 tablet, Refills: 11      CONTINUE these medications which have CHANGED   Details  carvedilol (COREG) 12.5 MG tablet Take 1 tablet (12.5 mg total) by mouth 2 (two) times daily with a meal. Qty: 60 tablet, Refills: 11    lisinopril (PRINIVIL,ZESTRIL) 5 MG  tablet Take 1 tablet (5 mg total) by mouth daily. Qty: 30 tablet, Refills: 11      CONTINUE these medications which have NOT CHANGED   Details  furosemide (LASIX) 40 MG tablet Take 40 mg by mouth daily.      isosorbide mononitrate (IMDUR) 30 MG 24 hr tablet Take 30 mg by mouth daily.      potassium chloride SA (K-DUR,KLOR-CON) 20 MEQ tablet Take 20 mEq by mouth daily.        STOP taking these medications     aspirin 81 MG tablet      digoxin (LANOXIN) 0.125 MG tablet        Follow-up Information    Follow up with [facility] in 1 week.   Contact information:   [address] [phone]          BRING ALL MEDICATIONS WITH YOU TO FOLLOW UP APPOINTMENTS  Time spent with patient to include physician time: 30 minutes  Signed: Sinclair Grooms 03/16/2011, 9:09 AM

## 2014-04-03 ENCOUNTER — Encounter (HOSPITAL_COMMUNITY): Payer: Self-pay | Admitting: Cardiovascular Disease

## 2016-01-28 ENCOUNTER — Ambulatory Visit: Payer: Self-pay | Admitting: Interventional Cardiology

## 2016-02-03 ENCOUNTER — Encounter: Payer: Self-pay | Admitting: Interventional Cardiology

## 2016-04-29 DIAGNOSIS — J9691 Respiratory failure, unspecified with hypoxia: Secondary | ICD-10-CM

## 2016-04-29 DIAGNOSIS — I4892 Unspecified atrial flutter: Secondary | ICD-10-CM

## 2016-04-29 DIAGNOSIS — I509 Heart failure, unspecified: Secondary | ICD-10-CM | POA: Diagnosis not present

## 2016-04-29 DIAGNOSIS — I429 Cardiomyopathy, unspecified: Secondary | ICD-10-CM | POA: Diagnosis not present

## 2016-04-30 ENCOUNTER — Inpatient Hospital Stay (HOSPITAL_COMMUNITY): Payer: Medicare HMO

## 2016-04-30 ENCOUNTER — Inpatient Hospital Stay (HOSPITAL_COMMUNITY)
Admission: AD | Admit: 2016-04-30 | Discharge: 2016-05-13 | DRG: 246 | Disposition: A | Discharge status: Home / Self Care | Payer: Medicare HMO | Source: Other Acute Inpatient Hospital | Attending: Internal Medicine | Admitting: Internal Medicine

## 2016-04-30 DIAGNOSIS — T462X5A Adverse effect of other antidysrhythmic drugs, initial encounter: Secondary | ICD-10-CM | POA: Diagnosis present

## 2016-04-30 DIAGNOSIS — D649 Anemia, unspecified: Secondary | ICD-10-CM | POA: Diagnosis not present

## 2016-04-30 DIAGNOSIS — R7989 Other specified abnormal findings of blood chemistry: Secondary | ICD-10-CM

## 2016-04-30 DIAGNOSIS — R001 Bradycardia, unspecified: Secondary | ICD-10-CM | POA: Diagnosis not present

## 2016-04-30 DIAGNOSIS — E785 Hyperlipidemia, unspecified: Secondary | ICD-10-CM | POA: Diagnosis present

## 2016-04-30 DIAGNOSIS — Z7902 Long term (current) use of antithrombotics/antiplatelets: Secondary | ICD-10-CM | POA: Diagnosis not present

## 2016-04-30 DIAGNOSIS — I4892 Unspecified atrial flutter: Secondary | ICD-10-CM | POA: Diagnosis present

## 2016-04-30 DIAGNOSIS — I252 Old myocardial infarction: Secondary | ICD-10-CM | POA: Diagnosis not present

## 2016-04-30 DIAGNOSIS — Z888 Allergy status to other drugs, medicaments and biological substances status: Secondary | ICD-10-CM | POA: Diagnosis not present

## 2016-04-30 DIAGNOSIS — I255 Ischemic cardiomyopathy: Secondary | ICD-10-CM | POA: Diagnosis present

## 2016-04-30 DIAGNOSIS — I480 Paroxysmal atrial fibrillation: Secondary | ICD-10-CM | POA: Diagnosis present

## 2016-04-30 DIAGNOSIS — J9691 Respiratory failure, unspecified with hypoxia: Secondary | ICD-10-CM | POA: Diagnosis not present

## 2016-04-30 DIAGNOSIS — I214 Non-ST elevation (NSTEMI) myocardial infarction: Secondary | ICD-10-CM | POA: Diagnosis not present

## 2016-04-30 DIAGNOSIS — I509 Heart failure, unspecified: Secondary | ICD-10-CM | POA: Diagnosis not present

## 2016-04-30 DIAGNOSIS — Z8249 Family history of ischemic heart disease and other diseases of the circulatory system: Secondary | ICD-10-CM | POA: Diagnosis not present

## 2016-04-30 DIAGNOSIS — I5041 Acute combined systolic (congestive) and diastolic (congestive) heart failure: Secondary | ICD-10-CM | POA: Diagnosis not present

## 2016-04-30 DIAGNOSIS — Y92239 Unspecified place in hospital as the place of occurrence of the external cause: Secondary | ICD-10-CM | POA: Diagnosis present

## 2016-04-30 DIAGNOSIS — I5043 Acute on chronic combined systolic (congestive) and diastolic (congestive) heart failure: Secondary | ICD-10-CM | POA: Diagnosis present

## 2016-04-30 DIAGNOSIS — E876 Hypokalemia: Secondary | ICD-10-CM | POA: Diagnosis not present

## 2016-04-30 DIAGNOSIS — I130 Hypertensive heart and chronic kidney disease with heart failure and stage 1 through stage 4 chronic kidney disease, or unspecified chronic kidney disease: Principal | ICD-10-CM | POA: Diagnosis present

## 2016-04-30 DIAGNOSIS — N183 Chronic kidney disease, stage 3 unspecified: Secondary | ICD-10-CM

## 2016-04-30 DIAGNOSIS — F17290 Nicotine dependence, other tobacco product, uncomplicated: Secondary | ICD-10-CM | POA: Diagnosis not present

## 2016-04-30 DIAGNOSIS — Z23 Encounter for immunization: Secondary | ICD-10-CM

## 2016-04-30 DIAGNOSIS — I469 Cardiac arrest, cause unspecified: Secondary | ICD-10-CM

## 2016-04-30 DIAGNOSIS — R570 Cardiogenic shock: Secondary | ICD-10-CM | POA: Diagnosis present

## 2016-04-30 DIAGNOSIS — R778 Other specified abnormalities of plasma proteins: Secondary | ICD-10-CM | POA: Diagnosis not present

## 2016-04-30 DIAGNOSIS — Z955 Presence of coronary angioplasty implant and graft: Secondary | ICD-10-CM | POA: Diagnosis not present

## 2016-04-30 DIAGNOSIS — I2510 Atherosclerotic heart disease of native coronary artery without angina pectoris: Secondary | ICD-10-CM | POA: Diagnosis present

## 2016-04-30 DIAGNOSIS — D72829 Elevated white blood cell count, unspecified: Secondary | ICD-10-CM | POA: Diagnosis not present

## 2016-04-30 DIAGNOSIS — J9601 Acute respiratory failure with hypoxia: Secondary | ICD-10-CM | POA: Diagnosis present

## 2016-04-30 DIAGNOSIS — Z79899 Other long term (current) drug therapy: Secondary | ICD-10-CM | POA: Diagnosis not present

## 2016-04-30 DIAGNOSIS — I429 Cardiomyopathy, unspecified: Secondary | ICD-10-CM | POA: Diagnosis not present

## 2016-04-30 DIAGNOSIS — Z452 Encounter for adjustment and management of vascular access device: Secondary | ICD-10-CM

## 2016-04-30 DIAGNOSIS — N179 Acute kidney failure, unspecified: Secondary | ICD-10-CM | POA: Diagnosis present

## 2016-04-30 DIAGNOSIS — J9600 Acute respiratory failure, unspecified whether with hypoxia or hypercapnia: Secondary | ICD-10-CM | POA: Diagnosis not present

## 2016-04-30 HISTORY — DX: Heart failure, unspecified: I50.9

## 2016-04-30 LAB — LACTIC ACID, PLASMA
LACTIC ACID, VENOUS: 1.4 mmol/L (ref 0.5–1.9)
Lactic Acid, Venous: 1.1 mmol/L (ref 0.5–1.9)

## 2016-04-30 LAB — COMPREHENSIVE METABOLIC PANEL
ALT: 149 U/L — ABNORMAL HIGH (ref 17–63)
ANION GAP: 12 (ref 5–15)
AST: 94 U/L — ABNORMAL HIGH (ref 15–41)
Albumin: 2.9 g/dL — ABNORMAL LOW (ref 3.5–5.0)
Alkaline Phosphatase: 132 U/L — ABNORMAL HIGH (ref 38–126)
BUN: 21 mg/dL — ABNORMAL HIGH (ref 6–20)
CHLORIDE: 105 mmol/L (ref 101–111)
CO2: 21 mmol/L — ABNORMAL LOW (ref 22–32)
Calcium: 8 mg/dL — ABNORMAL LOW (ref 8.9–10.3)
Creatinine, Ser: 1.67 mg/dL — ABNORMAL HIGH (ref 0.61–1.24)
GFR calc non Af Amer: 46 mL/min — ABNORMAL LOW (ref >60)
GFR, EST AFRICAN AMERICAN: 53 mL/min — AB (ref >60)
Glucose, Bld: 106 mg/dL — ABNORMAL HIGH (ref 65–99)
Potassium: 4 mmol/L (ref 3.5–5.1)
SODIUM: 138 mmol/L (ref 135–145)
Total Bilirubin: 1 mg/dL (ref 0.3–1.2)
Total Protein: 6.4 g/dL — ABNORMAL LOW (ref 6.5–8.1)

## 2016-04-30 LAB — BLOOD GAS, VENOUS
ACID-BASE DEFICIT: 1.4 mmol/L (ref 0.0–2.0)
BICARBONATE: 24.1 mmol/L (ref 20.0–28.0)
FIO2: 40
LHR: 16 {breaths}/min
O2 Saturation: 75.5 %
PEEP/CPAP: 5 cmH2O
PH VEN: 7.302 (ref 7.250–7.430)
Patient temperature: 98.6
VT: 500 mL
pCO2, Ven: 50.3 mmHg (ref 44.0–60.0)
pO2, Ven: 47.8 mmHg — ABNORMAL HIGH (ref 32.0–45.0)

## 2016-04-30 LAB — POCT I-STAT 3, ART BLOOD GAS (G3+)
Acid-base deficit: 4 mmol/L — ABNORMAL HIGH (ref 0.0–2.0)
Bicarbonate: 21.8 mmol/L (ref 20.0–28.0)
O2 Saturation: 98 %
TCO2: 23 mmol/L (ref 0–100)
pCO2 arterial: 40 mmHg (ref 32.0–48.0)
pH, Arterial: 7.345 — ABNORMAL LOW (ref 7.350–7.450)
pO2, Arterial: 106 mmHg (ref 83.0–108.0)

## 2016-04-30 LAB — TROPONIN I: Troponin I: 2.01 ng/mL (ref <0.03)

## 2016-04-30 LAB — TYPE AND SCREEN
ABO/RH(D): O POS
ANTIBODY SCREEN: NEGATIVE

## 2016-04-30 LAB — COOXEMETRY PANEL
Carboxyhemoglobin: 1.1 % (ref 0.5–1.5)
METHEMOGLOBIN: 1.2 % (ref 0.0–1.5)
O2 Saturation: 75.5 %
Total hemoglobin: 12.1 g/dL (ref 12.0–16.0)

## 2016-04-30 LAB — PROTIME-INR
INR: 1.44
Prothrombin Time: 17.7 seconds — ABNORMAL HIGH (ref 11.4–15.2)

## 2016-04-30 LAB — MAGNESIUM: MAGNESIUM: 2.1 mg/dL (ref 1.7–2.4)

## 2016-04-30 LAB — PHOSPHORUS: PHOSPHORUS: 4.3 mg/dL (ref 2.5–4.6)

## 2016-04-30 LAB — ABO/RH: ABO/RH(D): O POS

## 2016-04-30 MED ORDER — FAMOTIDINE IN NACL 20-0.9 MG/50ML-% IV SOLN
20.0000 mg | Freq: Two times a day (BID) | INTRAVENOUS | Status: DC
  Administered 2016-04-30 – 2016-05-01 (×2): 20 mg via INTRAVENOUS
  Filled 2016-04-30 (×2): qty 50

## 2016-04-30 MED ORDER — CHLORHEXIDINE GLUCONATE 0.12% ORAL RINSE (MEDLINE KIT)
15.0000 mL | Freq: Two times a day (BID) | OROMUCOSAL | Status: DC
  Administered 2016-04-30 – 2016-05-02 (×3): 15 mL via OROMUCOSAL

## 2016-04-30 MED ORDER — DOBUTAMINE IN D5W 4-5 MG/ML-% IV SOLN
2.5000 ug/kg/min | INTRAVENOUS | Status: AC
  Administered 2016-04-30: 5 ug/kg/min via INTRAVENOUS
  Filled 2016-04-30 (×2): qty 250

## 2016-04-30 MED ORDER — SODIUM CHLORIDE 0.9% FLUSH
3.0000 mL | Freq: Two times a day (BID) | INTRAVENOUS | Status: DC
  Administered 2016-04-30: 10 mL via INTRAVENOUS

## 2016-04-30 MED ORDER — ONDANSETRON HCL 4 MG/2ML IJ SOLN: 4.0000 mg | Freq: Four times a day (QID) | INTRAMUSCULAR | Status: DC | PRN

## 2016-04-30 MED ORDER — NOREPINEPHRINE BITARTRATE 1 MG/ML IV SOLN
2.0000 ug/min | INTRAVENOUS | Status: DC
  Administered 2016-04-30: 5 ug/min via INTRAVENOUS
  Filled 2016-04-30: qty 4

## 2016-04-30 MED ORDER — ACETAMINOPHEN 325 MG PO TABS
650.0000 mg | ORAL_TABLET | ORAL | Status: DC | PRN
  Administered 2016-05-02: 650 mg via ORAL
  Filled 2016-04-30: qty 2

## 2016-04-30 MED ORDER — SODIUM CHLORIDE 0.9% FLUSH: 3.0000 mL | INTRAVENOUS | Status: DC | PRN

## 2016-04-30 MED ORDER — FENTANYL BOLUS VIA INFUSION
20.0000 ug | INTRAVENOUS | Status: DC | PRN
Start: 2016-04-30 — End: 2016-05-01
  Filled 2016-04-30: qty 20

## 2016-04-30 MED ORDER — SIMVASTATIN 40 MG PO TABS
40.0000 mg | ORAL_TABLET | Freq: Every day | ORAL | Status: DC
  Administered 2016-05-01: 40 mg via ORAL
  Filled 2016-04-30: qty 1

## 2016-04-30 MED ORDER — HEPARIN BOLUS VIA INFUSION
4000.0000 [IU] | Freq: Once | INTRAVENOUS | Status: AC
  Administered 2016-04-30: 4000 [IU] via INTRAVENOUS
  Filled 2016-04-30: qty 4000

## 2016-04-30 MED ORDER — SODIUM CHLORIDE 0.9 % IV SOLN: INTRAVENOUS | Status: DC | PRN

## 2016-04-30 MED ORDER — ASPIRIN EC 81 MG PO TBEC
81.0000 mg | DELAYED_RELEASE_TABLET | Freq: Every day | ORAL | Status: DC
  Administered 2016-05-01 – 2016-05-04 (×4): 81 mg via ORAL
  Filled 2016-04-30 (×5): qty 1

## 2016-04-30 MED ORDER — HEPARIN (PORCINE) IN NACL 100-0.45 UNIT/ML-% IJ SOLN
1350.0000 [IU]/h | INTRAMUSCULAR | Status: DC
  Administered 2016-04-30: 1000 [IU]/h via INTRAVENOUS
  Administered 2016-05-01: 900 [IU]/h via INTRAVENOUS
  Administered 2016-05-03: 1350 [IU]/h via INTRAVENOUS
  Administered 2016-05-03: 950 [IU]/h via INTRAVENOUS
  Administered 2016-05-04: 1350 [IU]/h via INTRAVENOUS
  Filled 2016-04-30 (×5): qty 250

## 2016-04-30 MED ORDER — FAMOTIDINE 40 MG/5ML PO SUSR
20.0000 mg | Freq: Every day | ORAL | Status: DC
  Filled 2016-04-30: qty 2.5

## 2016-04-30 MED ORDER — HEPARIN SODIUM (PORCINE) 5000 UNIT/ML IJ SOLN: 5000.0000 [IU] | Freq: Three times a day (TID) | INTRAMUSCULAR | Status: DC

## 2016-04-30 MED ORDER — SODIUM CHLORIDE 0.9 % IV SOLN: 250.0000 mL | INTRAVENOUS | Status: DC | PRN

## 2016-04-30 MED ORDER — ORAL CARE MOUTH RINSE
15.0000 mL | Freq: Four times a day (QID) | OROMUCOSAL | Status: DC
  Administered 2016-05-01: 15 mL via OROMUCOSAL

## 2016-04-30 MED ORDER — FENTANYL 2500MCG IN NS 250ML (10MCG/ML) PREMIX INFUSION
25.0000 ug/h | INTRAVENOUS | Status: DC
  Administered 2016-04-30: 150 ug/h via INTRAVENOUS
  Filled 2016-04-30: qty 250

## 2016-04-30 NOTE — Progress Notes (Signed)
200 mL fentanyl wasted in sink with Margo Aye RN as witness.

## 2016-04-30 NOTE — H&P (Addendum)
Patient ID: David [name] MRN: [id number], DOB/AGE: November 04, 1965   Admit date: 04/30/2016   Primary Physician: No primary care provider on file. Primary Cardiologist: None  HPI: 51 yo male w/ history of ICM ('09 PCI to LAD, mRCA, '10 PCI to Lcx and '12 mRCA), HFrEF (EF ~20% for > 5 years), HLD and HTN who presents with acute decompensated heart failure.  He presented to [facility] on 1/5 after ~2 weeks of worsening SOB, DOE and orthopnea. Increasing fatigue, early satiety and increasing abdominal girth. No fevers or cough. No dysuria or sick contacts.   In OSH Ed, he developed symptomatic tachycardia (AFib/AFl) and received rapid IV amiodarone bolus leading to bradycardia and loss of pulse. Required 1-2 rounds of CPR and intubation. He was started on Dopa+Dobutamine. Transferred to [facility] and transitioned from Dopamine to Levophed.  Per wife, complaint with medication (not on DAPT), no recent dietary indiscretions. No chest pain.   Problem List  Past Medical History:  Diagnosis Date  . CAD (coronary artery disease) 2009   AMI in 12/2007 with PCI to LAD, staged PCI to  mid/distal RCA, NSTEMI in 02/2009 with BMS to LCx  . HLD (hyperlipidemia)   . HTN (hypertension)   . Ischemic cardiomyopathy    Admitted in 07/2010 with CHF exacerbation    Past Surgical History:  Procedure Laterality Date  . LEFT HEART CATHETERIZATION WITH CORONARY ANGIOGRAM N/A 03/13/2011   Procedure: LEFT HEART CATHETERIZATION WITH CORONARY ANGIOGRAM;  Surgeon: Lorretta Harp, MD;  Location: [facility] CATH LAB;  Service: Cardiovascular;  Laterality: N/A;    Allergies  Allergies  Allergen Reactions  . Plavix [Clopidogrel Bisulfate] Hives   Home Medications  Prior to Admission medications   Medication Sig Start Date End Date Taking? Authorizing Provider  carvedilol (COREG) 12.5 MG tablet Take 1 tablet (12.5 mg total) by mouth 2 (two) times daily with a meal. 03/16/11 03/15/12  Belva Crome, MD    furosemide (LASIX) 40 MG tablet Take 40 mg by mouth daily.      Historical Provider, MD  isosorbide mononitrate (IMDUR) 30 MG 24 hr tablet Take 30 mg by mouth daily.      Historical Provider, MD  lisinopril (PRINIVIL,ZESTRIL) 5 MG tablet Take 1 tablet (5 mg total) by mouth daily. 03/16/11   Belva Crome, MD  nitroGLYCERIN (NITROSTAT) 0.4 MG SL tablet Place 1 tablet (0.4 mg total) under the tongue every 5 (five) minutes as needed for chest pain. 03/16/11 03/15/12  Belva Crome, MD  potassium chloride SA (K-DUR,KLOR-CON) 20 MEQ tablet Take 20 mEq by mouth daily.      Historical Provider, MD  prasugrel (EFFIENT) 10 MG TABS Take 1 tablet (10 mg total) by mouth daily. 03/16/11   Belva Crome, MD  simvastatin (ZOCOR) 40 MG tablet Take 1 tablet (40 mg total) by mouth daily at 6 PM. 03/16/11 03/15/12  Belva Crome, MD    Family History  Family History  Problem Relation Age of Onset  . Coronary artery disease Father   . Hypertension Father   . Hypertension Sister   . Coronary artery disease Brother   . Emphysema Brother   . Coronary artery disease Mother   . Hypertension Mother   . Emphysema Mother   . Hypertension Daughter   . Obesity Daughter   . Diabetes    . Hypertension    . Coronary artery disease      Social History Smokes only 1-2 black-mild cigars, no cigarettes  Social History   Social History  . Marital status: Married    Spouse name: N/A  . Number of children: N/A  . Years of education: N/A   Occupational History  . Not on file.   Social History Main Topics  . Smoking status: Current Every Day Smoker    Packs/day: 0.20    Years: 30.00    Types: Cigars  . Smokeless tobacco: Never Used     Comment: Used to smoke cigarettes. Pt is currently smoking 3 black & mild cigars per day. He has cut down but never quit.  . Alcohol use No  . Drug use: No  . Sexual activity: Not on file   Other Topics Concern  . Not on file   Social History Narrative  . No narrative  on file     Review of Systems General:  No chills, fever, night sweats or weight changes.  Cardiovascular:  No chest pain, +dyspnea on exertion, edema, orthopnea, palpitations, paroxysmal nocturnal dyspnea. Dermatological: No rash, lesions/masses Respiratory: No cough, dyspnea Urologic: No hematuria, dysuria Abdominal:   No nausea, vomiting, diarrhea, bright red blood per rectum, melena, or hematemesis Neurologic:  No visual changes, wkns, changes in mental status. All other systems reviewed and are otherwise negative except as noted above.  Physical Exam  SpO2 98 %.  General: Intubated, responding, able to write sentences Neuro: Alert and oriented X 3. Moves all extremities spontaneously. HEENT: Normal  Neck: +JVD. Lungs:  Resp regular and unlabored, CTA. Heart: regular, tachy, no S3 Abdomen: Soft, non-tender, non-distended, BS + x 4.  Extremities: trace edema, L femoral line  Labs Creatinine 1.3-1.9 at OSH WBC 12-14 Hg 13 Plat 228 Trop 0.3-0.5 at OSH  Radiology/Studies CTA negative for PE  ECG Sinus tachy  Echocardiogram OSH EF < 20% Mild Mr, trace TR ==================================== ASSESSMENT AND PLAN 51 yo male w/ history of ICM (prior PCI to LAD, RCA and Lcx), HFrEF (EF <20%) and HLD who presented with acute decompensated heart failure and atrial aflutter.  # Acute Decompensated Systolic/Diastolic heart failure: reports progressive symptoms of >2 weeks - received Lasix 40mg  IV at OSH - monitor UOP - continue Levophed and Dobutamine for BP support - will attempt to wean levophed overnight and continue dobutamine for inotropic support - ordered cooxemetery panel to assess CO - will likely need Lasix in AM - will likely need repeat LHC on Monday to evaluate coronary anatomy as patient will likely need ICD placement (patient and family amenable to this). LVAD/Hx tx was discussed at OSH as well.   # Bradycardia/PEA: in setting of rapid IV bolus amio  administration. required CPR at OSH after administration of amiodarone - likely medication side effect - currently HR in 100-110s, sinus - repeat TTE after arrest  # Acute Respiratory Failure: had pH of 6.9 during code. Intubated, now with normalized pH  - appreciate Pulmonary ventilator assistance, but likely be able to extubated tonight/tomorrow AM in event of tachycardia requiring cardioversion  # Aflutter/Afib: in setting of AoCHF.  - monitor - may need IV amiodarone (w/o bolus) or DCCV if symptomatic Afib   # Troponinemia: likely type 2 in setting of heart failure/code. Unlikely plaque rupture or stent occlusion. Asympatomic - heparin gtt and repeat troponins - ASA/statin - repeat ECG   # AKI on CKD: Cr 1.6. Down from 1.8 at OSH - continue hemodynamic support  # Leukocytosis: WBC 12-14 at OSH, but possibly related CPR/code - no evidence of PNA on CTA at OSH or  CXR  - hope to extubate soon  # HLD - statin  FULL CODE  Signed, Charlies Silvers, MD

## 2016-04-30 NOTE — Procedures (Signed)
Date: 04/30/2016 Indication: Hemodynamic monitoring/Intravenous access  A time-out was completed verifying correct patient, procedure, site, positioning, and special equipment if applicable. The patient was placed in a dependent position appropriate for central line placement based on the vein to be cannulated. The patient's Right neck prepped and draped in sterile fashion. 1% Lidocaine was used to anesthetize the surrounding skin area. A triple lumen catheter was introduced into the the internal jugular using the Seldinger technique and under ultrasound guidance. The catheter was threaded smoothly over the guide wire and appropriate blood return was obtained. Each lumen of the catheter was evacuated of air and flushed with sterile saline. The catheter was then sutured in place to the skin and a sterile dressing applied. Perfusion to the extremity distal to the point of catheter insertion was checked and found to be adequate.   Estimated Blood Loss: 2ccThe patient tolerated the procedure well and there were no complications. Chest xray ordered to confirm placement.

## 2016-04-30 NOTE — H&P (Signed)
PULMONARY / CRITICAL CARE MEDICINE   Name: David [name] MRN: [id number] DOB: 01-15-1966    ADMISSION DATE:  04/30/2016 CONSULTATION DATE:  April 30, 2016  REFERRING MD:  Durenda Age, MD  CHIEF COMPLAINT:  Dyspnea and Chest Pain  HISTORY OF PRESENT ILLNESS:   David [name] is a 51 y/o man with CAD and ischemic cardiomyopathy who presented to OSH with dyspnea and was found to be in decompensated heart failure. He was in AF with RVR and was treated with amio, but developed bradycardia and cardiac arrest. He was resuscitated after <5 mins, with 1-2 rounds of CPR. He was intubated, but thereafter regained neurologic status, however, did require infusions of dobutamine and dopamine. He was transferred to [facility]. PCCM was consulted for vent management.  PAST MEDICAL HISTORY :  He  has a past medical history of CAD (coronary artery disease) (2009); HLD (hyperlipidemia); HTN (hypertension); and Ischemic cardiomyopathy.  PAST SURGICAL HISTORY: He  has a past surgical history that includes left heart catheterization with coronary angiogram (N/A, 03/13/2011).  Allergies  Allergen Reactions  . Plavix [Clopidogrel Bisulfate] Hives    No current facility-administered medications on file prior to encounter.    Current Outpatient Prescriptions on File Prior to Encounter  Medication Sig  . carvedilol (COREG) 12.5 MG tablet Take 1 tablet (12.5 mg total) by mouth 2 (two) times daily with a meal.  . furosemide (LASIX) 40 MG tablet Take 40 mg by mouth daily.    . isosorbide mononitrate (IMDUR) 30 MG 24 hr tablet Take 30 mg by mouth daily.    Marland Kitchen lisinopril (PRINIVIL,ZESTRIL) 5 MG tablet Take 1 tablet (5 mg total) by mouth daily.  . nitroGLYCERIN (NITROSTAT) 0.4 MG SL tablet Place 1 tablet (0.4 mg total) under the tongue every 5 (five) minutes as needed for chest pain.  . potassium chloride SA (K-DUR,KLOR-CON) 20 MEQ tablet Take 20 mEq by mouth daily.    . prasugrel (EFFIENT) 10 MG TABS Take 1 tablet (10 mg  total) by mouth daily.  . simvastatin (ZOCOR) 40 MG tablet Take 1 tablet (40 mg total) by mouth daily at 6 PM.    FAMILY HISTORY:  His @FAMSTP (<SUBSCRIPT> error)@  SOCIAL HISTORY: He  reports that he has been smoking Cigars.  He has a 6.00 pack-year smoking history. He has never used smokeless tobacco. He reports that he does not drink alcohol or use drugs.   VITAL SIGNS: SpO2 98%   HEMODYNAMICS:    VENTILATOR SETTINGS: Vent Mode: PSV;CPAP FiO2 (%):  [40 %] 40 % Set Rate:  [16 bmp] 16 bmp Vt Set:  [500 mL] 500 mL PEEP:  [5 cmH20] 5 cmH20 Pressure Support:  [5 cmH20] 5 cmH20  INTAKE / OUTPUT: No intake/output data recorded.  PHYSICAL EXAMINATION: General:  Middle aged man awake on ventilator Neuro:  Responsive to yes-no questions appropriately HEENT:  ETT in place Cardiovascular:  Tachycardic, cool extremities Lungs:  CTA Abdomen:  Soft Musculoskeletal:  No deformities Skin:  No rashes on visible skin  LABS:  BMET No results for input(s): NA, K, CL, CO2, BUN, CREATININE, GLUCOSE in the last 168 hours.  Electrolytes No results for input(s): CALCIUM, MG, PHOS in the last 168 hours.  CBC No results for input(s): WBC, HGB, HCT, PLT in the last 168 hours.  Coag's No results for input(s): APTT, INR in the last 168 hours.  Sepsis Markers No results for input(s): LATICACIDVEN, PROCALCITON, O2SATVEN in the last 168 hours.  ABG  Recent Labs Lab 04/30/16  2008  PHART 7.345*  PCO2ART 40.0  PO2ART 106.0    Liver Enzymes No results for input(s): AST, ALT, ALKPHOS, BILITOT, ALBUMIN in the last 168 hours.  Cardiac Enzymes No results for input(s): TROPONINI, PROBNP in the last 168 hours.  Glucose No results for input(s): GLUCAP in the last 168 hours.  Imaging Portable Chest Xray  Result Date: 04/30/2016 CLINICAL DATA:  Cardiorespiratory arrest.  Intubation. EXAM: PORTABLE CHEST 1 VIEW COMPARISON:  Chest x-rays earlier today 5:15 a.m., yesterday and previously.  CTA chest yesterday. FINDINGS: Endotracheal tube tip in satisfactory position projecting approximately 7 cm above carina. Nasogastric tube courses below the diaphragm into the stomach. External pacing pads are present. Cardiac silhouette markedly enlarged, unchanged. Interval resolution of the mild interstitial pulmonary edema and the atelectasis in the right lower lobe since this morning. Only minimal atelectasis persists at the right lung base. Lungs otherwise clear. IMPRESSION: 1. Support apparatus satisfactory. 2. Resolution of interstitial pulmonary edema since earlier today. 3. Improved aeration in the right lower lobe since earlier today with only minimal atelectasis persisting. 4. No new abnormalities. Electronically Signed   By: Evangeline Dakin M.D.   On: 04/30/2016 20:05    STUDIES:  None  CULTURES: None  ANTIBIOTICS: None  SIGNIFICANT EVENTS: Cardiac arrest Intubation  LINES/TUBES: ETT R IJ CVC Foley  DISCUSSION: 51 y/o man with ICM p/w decompensated heart failure and cardiac arrest  ASSESSMENT / PLAN:  PULMONARY A: Respiratory failure requiring mechanical ventilation P:   PRVC overnight SBT in AM Likely able to extubate in AM  CARDIOVASCULAR A:  ICM Cardiac arrest AF w/ RVR P:  Defer to cardiology, agree w/ inotropic support plus diuresis.  RENAL A:   Renal insuffiencey, unclear chronicity P:   Trend lytes and renal function  GASTROINTESTINAL A:   Mild elevation in LFTs P:   Likely related to transient circulatory arrest.  HEMATOLOGIC A:   No active issues P:   INFECTIOUS A:   No active issues P:    ENDOCRINE A:   No active issues   P:    NEUROLOGIC A:   Appears neurologically intact P:   No role for cooling  FAMILY  - Updates:   - Inter-disciplinary family meet or Palliative Care meeting due by:  05/09/15  CRITICAL CARE Performed by: Luz Brazen   Total critical care time: 35 minutes  Critical care time was  exclusive of separately billable procedures and treating other patients.  Critical care was necessary to treat or prevent imminent or life-threatening deterioration.  Critical care was time spent personally by me on the following activities: development of treatment plan with patient and/or surrogate as well as nursing, discussions with consultants, evaluation of patient's response to treatment, examination of patient, obtaining history from patient or surrogate, ordering and performing treatments and interventions, ordering and review of laboratory studies, ordering and review of radiographic studies, pulse oximetry and re-evaluation of patient's condition.  Luz Brazen, MD Pulmonary and [place] Pager: [phone]  04/30/2016, 8:29 PM

## 2016-04-30 NOTE — Progress Notes (Signed)
RN notified and MD aware that patient's troponin is 2.01.

## 2016-04-30 NOTE — Progress Notes (Addendum)
ANTICOAGULATION CONSULT NOTE - Initial Consult  Pharmacy Consult for Heparin  Indication: chest pain/ACS, s/p cardiac arrest  Allergies  Allergen Reactions  . Plavix [Clopidogrel Bisulfate] Hives   Patient Measurements: Weight: 167 lb 14.4 oz (76.2 kg)  Vital Signs: BP: 94/72 (01/06 2000) Pulse Rate: 129 (01/06 2000)  Labs:  Recent Labs  04/30/16 1950  LABPROT 17.7*  INR 1.44  CREATININE 1.67*  TROPONINI 2.01*     Medical History: Past Medical History:  Diagnosis Date  . CAD (coronary artery disease) 2009   AMI in 12/2007 with PCI to LAD, staged PCI to  mid/distal RCA, NSTEMI in 02/2009 with BMS to LCx  . HLD (hyperlipidemia)   . HTN (hypertension)   . Ischemic cardiomyopathy    Admitted in 07/2010 with CHF exacerbation    Assessment: 51 y/o M transfer from outside hospital, pt had cardiac arrest at outside hospital, starting heparin for elevated troponin, baseline INR 1.44, noted renal dysfunction.   Goal of Therapy:  Heparin level 0.3-0.7 units/ml Monitor platelets by anticoagulation protocol: Yes   Plan:  Heparin 4000 units BOLUS Start heparin drip at 1000 units/hr 0700 HL Daily CBC/HL Monitor for bleeding  Narda Bonds 04/30/2016,9:53 PM   ========================== Addendum 6:20 AM Heparin level elevated this AM, some bleeding with central line removal that resolved -Dec heparin to 900 units/hr -1400 HL Narda Bonds, PharmD, BCPS Clinical Pharmacist Phone: [phone] ===========================

## 2016-04-30 NOTE — Procedures (Signed)
Arterial Catheter Insertion Procedure Note David [name] [id number] [birth year]  Procedure: Insertion of Arterial Catheter  Indications: Blood pressure monitoring and Frequent blood sampling  Procedure Details Consent: Risks of procedure as well as the alternatives and risks of each were explained to the (patient/caregiver).  Consent for procedure obtained. Consent obtained from wife. Time Out: Verified patient identification, verified procedure, site/side was marked, verified correct patient position, special equipment/implants available, medications/allergies/relevent history reviewed, required imaging and test results available.  Performed  Maximum sterile technique was used including antiseptics, cap, gloves, gown, hand hygiene, mask and sheet. Skin prep: Chlorhexidine; local anesthetic administered 20 gauge catheter was inserted into right radial artery using the Seldinger technique.  Evaluation Blood flow good; BP tracing good. Complications: No apparent complications.  Pt tolerated well.  Miquel Dunn 04/30/2016

## 2016-05-01 DIAGNOSIS — I469 Cardiac arrest, cause unspecified: Secondary | ICD-10-CM

## 2016-05-01 DIAGNOSIS — I5043 Acute on chronic combined systolic (congestive) and diastolic (congestive) heart failure: Secondary | ICD-10-CM

## 2016-05-01 DIAGNOSIS — J9601 Acute respiratory failure with hypoxia: Secondary | ICD-10-CM

## 2016-05-01 DIAGNOSIS — I480 Paroxysmal atrial fibrillation: Secondary | ICD-10-CM

## 2016-05-01 LAB — CBC
HEMATOCRIT: 28.5 % — AB (ref 39.0–52.0)
Hemoglobin: 9.3 g/dL — ABNORMAL LOW (ref 13.0–17.0)
MCH: 27.1 pg (ref 26.0–34.0)
MCHC: 32.6 g/dL (ref 30.0–36.0)
MCV: 83.1 fL (ref 78.0–100.0)
PLATELETS: 174 10*3/uL (ref 150–400)
RBC: 3.43 MIL/uL — ABNORMAL LOW (ref 4.22–5.81)
RDW: 16.4 % — AB (ref 11.5–15.5)
WBC: 10.8 10*3/uL — ABNORMAL HIGH (ref 4.0–10.5)

## 2016-05-01 LAB — BASIC METABOLIC PANEL
ANION GAP: 10 (ref 5–15)
BUN: 22 mg/dL — ABNORMAL HIGH (ref 6–20)
CALCIUM: 8.2 mg/dL — AB (ref 8.9–10.3)
CO2: 25 mmol/L (ref 22–32)
Chloride: 107 mmol/L (ref 101–111)
Creatinine, Ser: 1.62 mg/dL — ABNORMAL HIGH (ref 0.61–1.24)
GFR, EST AFRICAN AMERICAN: 55 mL/min — AB (ref >60)
GFR, EST NON AFRICAN AMERICAN: 47 mL/min — AB (ref >60)
Glucose, Bld: 103 mg/dL — ABNORMAL HIGH (ref 65–99)
POTASSIUM: 4.1 mmol/L (ref 3.5–5.1)
Sodium: 142 mmol/L (ref 135–145)

## 2016-05-01 LAB — CBC WITH DIFFERENTIAL/PLATELET
BASOS PCT: 0 %
Basophils Absolute: 0 10*3/uL (ref 0.0–0.1)
EOS ABS: 0 10*3/uL (ref 0.0–0.7)
Eosinophils Relative: 0 %
HCT: 37.6 % — ABNORMAL LOW (ref 39.0–52.0)
HEMOGLOBIN: 12.4 g/dL — AB (ref 13.0–17.0)
LYMPHS ABS: 1.1 10*3/uL (ref 0.7–4.0)
Lymphocytes Relative: 6 %
MCH: 27 pg (ref 26.0–34.0)
MCHC: 33 g/dL (ref 30.0–36.0)
MCV: 81.9 fL (ref 78.0–100.0)
Monocytes Absolute: 1.4 10*3/uL — ABNORMAL HIGH (ref 0.1–1.0)
Monocytes Relative: 7 %
NEUTROS PCT: 87 %
Neutro Abs: 16.2 10*3/uL — ABNORMAL HIGH (ref 1.7–7.7)
Platelets: 253 10*3/uL (ref 150–400)
RBC: 4.59 MIL/uL (ref 4.22–5.81)
RDW: 15.8 % — ABNORMAL HIGH (ref 11.5–15.5)
WBC: 18.7 10*3/uL — AB (ref 4.0–10.5)

## 2016-05-01 LAB — HEPARIN LEVEL (UNFRACTIONATED)
HEPARIN UNFRACTIONATED: 0.87 [IU]/mL — AB (ref 0.30–0.70)
Heparin Unfractionated: 0.65 IU/mL (ref 0.30–0.70)
Heparin Unfractionated: 0.35 IU/mL (ref 0.30–0.70)

## 2016-05-01 LAB — MRSA PCR SCREENING: MRSA by PCR: POSITIVE — AB

## 2016-05-01 LAB — TROPONIN I
TROPONIN I: 1.89 ng/mL — AB (ref <0.03)
TROPONIN I: 1.62 ng/mL — AB (ref <0.03)

## 2016-05-01 LAB — MAGNESIUM: MAGNESIUM: 1.7 mg/dL (ref 1.7–2.4)

## 2016-05-01 MED ORDER — TRAZODONE HCL 50 MG PO TABS
100.0000 mg | ORAL_TABLET | Freq: Every day | ORAL | Status: DC
  Administered 2016-05-01 – 2016-05-11 (×10): 100 mg via ORAL
  Filled 2016-05-01 (×11): qty 2

## 2016-05-01 MED ORDER — SPIRONOLACTONE 25 MG PO TABS
12.5000 mg | ORAL_TABLET | Freq: Every day | ORAL | Status: DC
  Administered 2016-05-01: 12.5 mg via ORAL
  Filled 2016-05-01: qty 1

## 2016-05-01 MED ORDER — ORAL CARE MOUTH RINSE
15.0000 mL | Freq: Four times a day (QID) | OROMUCOSAL | Status: DC
  Administered 2016-05-01 – 2016-05-02 (×3): 15 mL via OROMUCOSAL

## 2016-05-01 MED ORDER — PNEUMOCOCCAL VAC POLYVALENT 25 MCG/0.5ML IJ INJ
0.5000 mL | INJECTION | INTRAMUSCULAR | Status: AC
  Administered 2016-05-02: 0.5 mL via INTRAMUSCULAR
  Filled 2016-05-01: qty 0.5

## 2016-05-01 MED ORDER — DIGOXIN 125 MCG PO TABS
0.1250 mg | ORAL_TABLET | Freq: Every day | ORAL | Status: DC
  Administered 2016-05-01 – 2016-05-13 (×12): 0.125 mg via ORAL
  Filled 2016-05-01 (×12): qty 1

## 2016-05-01 MED ORDER — INFLUENZA VAC SPLIT QUAD 0.5 ML IM SUSY
0.5000 mL | PREFILLED_SYRINGE | INTRAMUSCULAR | Status: DC
  Filled 2016-05-01: qty 0.5

## 2016-05-01 MED ORDER — FUROSEMIDE 10 MG/ML IJ SOLN
80.0000 mg | INTRAMUSCULAR | Status: DC
  Administered 2016-05-01 – 2016-05-03 (×8): 80 mg via INTRAVENOUS
  Filled 2016-05-01 (×7): qty 8

## 2016-05-01 MED ORDER — CHLORHEXIDINE GLUCONATE 0.12% ORAL RINSE (MEDLINE KIT)
15.0000 mL | Freq: Two times a day (BID) | OROMUCOSAL | Status: DC
  Administered 2016-05-01 – 2016-05-04 (×4): 15 mL via OROMUCOSAL

## 2016-05-01 MED ORDER — MUPIROCIN 2 % EX OINT
1.0000 "application " | TOPICAL_OINTMENT | Freq: Two times a day (BID) | CUTANEOUS | Status: AC
  Administered 2016-05-01 – 2016-05-06 (×10): 1 via NASAL
  Filled 2016-05-01 (×2): qty 22

## 2016-05-01 MED ORDER — SODIUM CHLORIDE 0.9% FLUSH
10.0000 mL | INTRAVENOUS | Status: DC | PRN
  Administered 2016-05-02: 3 mL
  Filled 2016-05-01: qty 40

## 2016-05-01 MED ORDER — FUROSEMIDE 10 MG/ML IJ SOLN
INTRAMUSCULAR | Status: AC
  Filled 2016-05-01: qty 4

## 2016-05-01 MED ORDER — CHLORHEXIDINE GLUCONATE CLOTH 2 % EX PADS
6.0000 | MEDICATED_PAD | Freq: Every day | CUTANEOUS | Status: AC
  Administered 2016-05-02 – 2016-05-06 (×5): 6 via TOPICAL

## 2016-05-01 MED ORDER — SODIUM CHLORIDE 0.9% FLUSH
10.0000 mL | Freq: Two times a day (BID) | INTRAVENOUS | Status: DC
  Administered 2016-05-01 – 2016-05-06 (×9): 10 mL
  Administered 2016-05-06 – 2016-05-07 (×2): 30 mL
  Administered 2016-05-07: 10 mL
  Administered 2016-05-08: 20 mL
  Administered 2016-05-08: 30 mL
  Administered 2016-05-09 – 2016-05-12 (×5): 10 mL
  Administered 2016-05-12: 20 mL
  Administered 2016-05-13: 10 mL

## 2016-05-01 NOTE — Progress Notes (Signed)
[place] for Heparin  Indication: chest pain/ACS, s/p cardiac arrest  Allergies  Allergen Reactions  . Plavix [Clopidogrel Bisulfate] Hives   Patient Measurements: Height: 5\' 5"  (165.1 cm) Weight: 167 lb 11.2 oz (76.1 kg) IBW/kg (Calculated) : 61.5  Vital Signs: Temp: 98.1 F (36.7 C) (01/07 1100) Temp Source: Oral (01/07 1100) BP: 96/78 (01/07 1400) Pulse Rate: 103 (01/07 1400)  Labs:  Recent Labs  04/30/16 1950 05/01/16 0150 05/01/16 0444 05/01/16 0534 05/01/16 1410  HGB 12.4*  --   --   --   --   HCT 37.6*  --   --   --   --   PLT 253  --   --   --   --   LABPROT 17.7*  --   --   --   --   INR 1.44  --   --   --   --   HEPARINUNFRC  --   --   --  0.87* 0.65  CREATININE 1.67*  --  1.62*  --   --   TROPONINI 2.01* 1.89* 1.62*  --   --      Medical History: Past Medical History:  Diagnosis Date  . CAD (coronary artery disease) 2009   AMI in 12/2007 with PCI to LAD, staged PCI to  mid/distal RCA, NSTEMI in 02/2009 with BMS to LCx  . HLD (hyperlipidemia)   . HTN (hypertension)   . Ischemic cardiomyopathy    Admitted in 07/2010 with CHF exacerbation   . DOBUTamine 5 mcg/kg/min (04/30/16 2153)  . heparin 900 Units/hr (05/01/16 6606)    Assessment: 51 y/o M transfer from outside hospital, pt had cardiac arrest at outside hospital, starting heparin for elevated troponin, baseline INR 1.44, noted renal dysfunction.   Heparin level now at goal on heparin 900 units/hr.  CBC not drawn today > ordering now.  Goal of Therapy:  Heparin level 0.3-0.7 units/ml Monitor platelets by anticoagulation protocol: Yes   Plan:  Continue IV heparin at current rate. Confirm heparin level at 2300 PM. Daily CBC and heparin level. Monitor for bleeding  [name], BCPS  Clinical Pharmacist Pager [phone]  05/01/2016 3:18 PM

## 2016-05-01 NOTE — Progress Notes (Signed)
eLink Physician-Brief Progress Note Patient Name: David [name] DOB: 07/21/1965 MRN: [id number]   Date of Service  05/01/2016  HPI/Events of Note  Nursing requesting dc pepcid IV now that pt off vent  eICU Interventions  D/c pepcid     Intervention Category Minor Interventions: Routine modifications to care plan (e.g. PRN medications for pain, fever)  Christinia Gully 05/01/2016, 9:01 PM

## 2016-05-01 NOTE — Progress Notes (Signed)
ANTICOAGULATION CONSULT NOTE - Follow Up Consult  Pharmacy Consult for heparin Indication: s/p cardiac arrest  Labs:  Recent Labs  04/30/16 1950 05/01/16 0150 05/01/16 0444 05/01/16 0534 05/01/16 1410 05/01/16 1625 05/01/16 2324  HGB 12.4*  --   --   --   --  9.3*  --   HCT 37.6*  --   --   --   --  28.5*  --   PLT 253  --   --   --   --  174  --   LABPROT 17.7*  --   --   --   --   --   --   INR 1.44  --   --   --   --   --   --   HEPARINUNFRC  --   --   --  0.87* 0.65  --  0.35  CREATININE 1.67*  --  1.62*  --   --   --   --   TROPONINI 2.01* 1.89* 1.62*  --   --   --   --     Assessment: 51yo male remains therapeutic on heparin though level has dropped significantly to low end of goal.  Goal of Therapy:  Heparin level 0.3-0.7 units/ml   Plan:  Will increase heparin gtt slightly to 950 units/hr to prevent dropping below goal and confirm stable with am labs.  Wynona Neat, PharmD, BCPS  05/01/2016,11:48 PM

## 2016-05-01 NOTE — Procedures (Signed)
Extubation Procedure Note  Patient Details:   Name: David [name] DOB: 21-Dec-1965 MRN: [id number]   Airway Documentation:     Evaluation  O2 sats: stable throughout Complications: No apparent complications Patient did tolerate procedure well. Bilateral Breath Sounds: Diminished   Yes  Elsie Stain 05/01/2016, 12:11 PM

## 2016-05-01 NOTE — Progress Notes (Signed)
Advanced Heart Failure Rounding Note  Cardiologist: Dr. Geraldo Pitter ([place])  Subjective:    He presented to [facility] on 1/5 after ~2 weeks of worsening HF symptoms.  In OSH Ed, he developed symptomatic tachycardia (AFib/AFl) and received rapid IV amiodarone bolus leading to bradycardia and loss of pulse. Required 1-2 rounds of CPR and intubation. He was started on Dopa+Dobutamine. Transferred to [facility] and transitioned from Dopamine to Levophed.  Extubated this am. CXR with resolution of pulmonary edema. Co-ox 76% last night on dobutamine and norepi. Now on dobutamine 5. Norepi down to 2. SBP ~ 120-130. Denies CP or SOB. In NSR/sinus tach. CVP 19.     Objective:   Weight Range:  Vital Signs:   Temp:  [98 F (36.7 C)-98.6 F (37 C)] 98.1 F (36.7 C) (01/07 1100) Pulse Rate:  [93-129] 108 (01/07 1100) Resp:  [13-24] 15 (01/07 1100) BP: (79-113)/(57-93) 113/88 (01/07 1100) SpO2:  [95 %-100 %] 100 % (01/07 1100) FiO2 (%):  [40 %] 40 % (01/07 0918) Weight:  [76.1 kg (167 lb 11.2 oz)-76.2 kg (167 lb 14.4 oz)] 76.1 kg (167 lb 11.2 oz) (01/07 0500) Last BM Date:  (UTA)  Weight change: Filed Weights   04/30/16 1949 05/01/16 0500  Weight: 76.2 kg (167 lb 14.4 oz) 76.1 kg (167 lb 11.2 oz)    Intake/Output:   Intake/Output Summary (Last 24 hours) at 05/01/16 1245 Last data filed at 05/01/16 0900  Gross per 24 hour  Intake           633.18 ml  Output              500 ml  Net           133.18 ml     Physical Exam: General:  Lying in bed. No resp difficulty HEENT: normal Neck: supple. RIJ TLC. JVP to ear . Carotids 2+ bilat; no bruits. No lymphadenopathy or thryomegaly appreciated. Cor: PMI laterally displaced. Regular/tachy. No s3 Lungs: clear Abdomen: soft, nontender, nondistended. No hepatosplenomegaly. No bruits or masses. Good bowel sounds. Extremities: no cyanosis, clubbing, rash, edema Neuro: alert & orientedx3, cranial nerves grossly intact. moves all 4  extremities w/o difficulty. Affect pleasant  Telemetry: Sr/STach 90-105  Labs: Basic Metabolic Panel:  Recent Labs Lab 04/30/16 1950 05/01/16 0444  NA 138 142  K 4.0 4.1  CL 105 107  CO2 21* 25  GLUCOSE 106* 103*  BUN 21* 22*  CREATININE 1.67* 1.62*  CALCIUM 8.0* 8.2*  MG 2.1  --   PHOS 4.3  --     Liver Function Tests:  Recent Labs Lab 04/30/16 1950  AST 94*  ALT 149*  ALKPHOS 132*  BILITOT 1.0  PROT 6.4*  ALBUMIN 2.9*   No results for input(s): LIPASE, AMYLASE in the last 168 hours. No results for input(s): AMMONIA in the last 168 hours.  CBC:  Recent Labs Lab 04/30/16 1950  WBC 18.7*  NEUTROABS 16.2*  HGB 12.4*  HCT 37.6*  MCV 81.9  PLT 253    Cardiac Enzymes:  Recent Labs Lab 04/30/16 1950 05/01/16 0150 05/01/16 0444  TROPONINI 2.01* 1.89* 1.62*    BNP: BNP (last 3 results) No results for input(s): BNP in the last 8760 hours.  ProBNP (last 3 results) No results for input(s): PROBNP in the last 8760 hours.    Other results:  Imaging: Dg Chest Port 1 View  Result Date: 04/30/2016 CLINICAL DATA:  Hypertension. Respiratory failure. Central line placement. EXAM: PORTABLE CHEST 1  VIEW COMPARISON:  04/30/2016 at 19:49 FINDINGS: New right jugular central line extends into the low SVC. Endotracheal tube is 5.8 cm above the carina. Nasogastric tube extends into the stomach. No pneumothorax. Unchanged cardiomegaly. Mild basilar airspace opacities, accentuated by a shallow degree of inspiration. IMPRESSION: 1. Support equipment appears satisfactorily positioned. New right IJ line extends into the low SVC. 2. No pneumothorax. 3. Stable cardiomegaly and mild basilar airspace opacities. Electronically Signed   By: Andreas Newport M.D.   On: 04/30/2016 21:56   Portable Chest Xray  Result Date: 04/30/2016 CLINICAL DATA:  Cardiorespiratory arrest.  Intubation. EXAM: PORTABLE CHEST 1 VIEW COMPARISON:  Chest x-rays earlier today 5:15 a.m., yesterday and  previously. CTA chest yesterday. FINDINGS: Endotracheal tube tip in satisfactory position projecting approximately 7 cm above carina. Nasogastric tube courses below the diaphragm into the stomach. External pacing pads are present. Cardiac silhouette markedly enlarged, unchanged. Interval resolution of the mild interstitial pulmonary edema and the atelectasis in the right lower lobe since this morning. Only minimal atelectasis persists at the right lung base. Lungs otherwise clear. IMPRESSION: 1. Support apparatus satisfactory. 2. Resolution of interstitial pulmonary edema since earlier today. 3. Improved aeration in the right lower lobe since earlier today with only minimal atelectasis persisting. 4. No new abnormalities. Electronically Signed   By: Evangeline Dakin M.D.   On: 04/30/2016 20:05      Medications:     Scheduled Medications: . aspirin EC  81 mg Oral Daily  . chlorhexidine gluconate (MEDLINE KIT)  15 mL Mouth Rinse BID  . chlorhexidine gluconate (MEDLINE KIT)  15 mL Mouth Rinse BID  . famotidine (PEPCID) IV  20 mg Intravenous Q12H  . mouth rinse  15 mL Mouth Rinse QID  . simvastatin  40 mg Oral q1800  . sodium chloride flush  10-40 mL Intracatheter Q12H     Infusions: . DOBUTamine 5 mcg/kg/min (04/30/16 2153)  . heparin 900 Units/hr (05/01/16 0620)  . norepinephrine (LEVOPHED) Adult infusion 2 mcg/min (05/01/16 0142)     PRN Medications:  Place/Maintain arterial line **AND** sodium chloride, sodium chloride, acetaminophen, ondansetron (ZOFRAN) IV, sodium chloride flush   Assessment:   1. Cardiogenic shock 2. PEA arrest 3. Acute on chronic systolic HF   --EF < 06% due to iCM 4. Acute hypoxemic respiratory failure 5. Elevated troponin 6. PAF 7. CAD    --s/p ('09 PCI to LAD, mRCA, '10 PCI to Lcx and '12 mRCA),  8. CKD, stage III 9. Tobacco use    -says he is smoking 1/2 cigarette per day  Plan/Discussion:    Improved today. Extubated. BP stable. Co-ox looks  good but CVP still very high. Will stop norepi. Continue dobutamine. Start lasix 80 IV q8.   Will repeat echo. Once fully diuresed will wean dobutamine and plan R/L heart cath to help decide about need for advanced therapies. Start low-dose digoxin and spiro. No b-blocker or ACE/ARB/ARNI for now. Can consider Bidil as BP tolerates. Also consider ICD in near future.   By history it seems like he has had worsening HF for 2-3 weeks without angina. Suspect PAF may have put him over the edge. Developed PEA with amio bolus. Now back in NSR. Will continue heparin and will need DOAC on d/c. Hold off on further amio at this point.   Can remove Foley.  Length of Stay: 1   Bensimhon, Daniel 05/01/2016, 12:45 PM  Advanced Heart Failure Team Pager [phone] (M-F; 7a - 4p)  Please contact [facility] for  night-coverage after hours (4p -7a ) and weekends on [email or web address]

## 2016-05-01 NOTE — Progress Notes (Signed)
PULMONARY / CRITICAL CARE MEDICINE   Name: David [name] MRN: [id number] DOB: Oct 25, 1965    ADMISSION DATE:  04/30/2016 CONSULTATION DATE:  May 01, 2016  REFERRING MD:  Durenda Age, MD  CHIEF COMPLAINT:  Dyspnea and Chest Pain  HISTORY OF PRESENT ILLNESS:   David [name] is a 51 y/o man with CAD and ischemic cardiomyopathy who presented to OSH with dyspnea and was found to be in decompensated heart failure. He was in AF with RVR and was treated with amio, but developed bradycardia and cardiac arrest. He was resuscitated after <5 mins, with 1-2 rounds of CPR. He was intubated, but thereafter regained neurologic status, however, did require infusions of dobutamine and dopamine. He was transferred to [facility]. PCCM was consulted for vent management.  SUBJ _ awake on fent gtt, able to write Denies CP, dyspnea Afebrile Critically ill, intubated on 5 mcg dobutamine  VITAL SIGNS: BP 107/75   Pulse 97   Temp 98 F (36.7 C) (Oral)   Resp 13   Ht 5\' 5"  (1.651 m)   Wt 76.1 kg (167 lb 11.2 oz)   SpO2 99%   BMI 27.91 kg/m   HEMODYNAMICS:    VENTILATOR SETTINGS: Vent Mode: PRVC FiO2 (%):  [40 %] 40 % Set Rate:  [16 bmp] 16 bmp Vt Set:  [500 mL] 500 mL PEEP:  [5 cmH20] 5 cmH20 Pressure Support:  [5 cmH20] 5 cmH20 Plateau Pressure:  [15 cmH20-19 cmH20] 19 cmH20  INTAKE / OUTPUT: I/O last 3 completed shifts: In: 502.3 [I.V.:452.3; IV Piggyback:50] Out: 500 [Urine:500]  PHYSICAL EXAMINATION: General:  Middle aged man awake on ventilator Neuro:  Alert, interactive, non focal HEENT:  ETT in place Cardiovascular:  Tachycardic, warm extremities Lungs:  CTA Abdomen:  Soft Musculoskeletal:  No deformities Skin:  No rashes on visible skin  LABS:  BMET  Recent Labs Lab 04/30/16 1950 05/01/16 0444  NA 138 142  K 4.0 4.1  CL 105 107  CO2 21* 25  BUN 21* 22*  CREATININE 1.67* 1.62*  GLUCOSE 106* 103*    Electrolytes  Recent Labs Lab 04/30/16 1950 05/01/16 0444   CALCIUM 8.0* 8.2*  MG 2.1  --   PHOS 4.3  --     CBC  Recent Labs Lab 04/30/16 1950  WBC 18.7*  HGB 12.4*  HCT 37.6*  PLT 253    Coag's  Recent Labs Lab 04/30/16 1950  INR 1.44    Sepsis Markers  Recent Labs Lab 04/30/16 1950 04/30/16 2254  LATICACIDVEN 1.4 1.1    ABG  Recent Labs Lab 04/30/16 2008  PHART 7.345*  PCO2ART 40.0  PO2ART 106.0    Liver Enzymes  Recent Labs Lab 04/30/16 1950  AST 94*  ALT 149*  ALKPHOS 132*  BILITOT 1.0  ALBUMIN 2.9*    Cardiac Enzymes  Recent Labs Lab 04/30/16 1950 05/01/16 0150 05/01/16 0444  TROPONINI 2.01* 1.89* 1.62*    Glucose No results for input(s): GLUCAP in the last 168 hours.  Imaging Dg Chest Port 1 View  Result Date: 04/30/2016 CLINICAL DATA:  Hypertension. Respiratory failure. Central line placement. EXAM: PORTABLE CHEST 1 VIEW COMPARISON:  04/30/2016 at 19:49 FINDINGS: New right jugular central line extends into the low SVC. Endotracheal tube is 5.8 cm above the carina. Nasogastric tube extends into the stomach. No pneumothorax. Unchanged cardiomegaly. Mild basilar airspace opacities, accentuated by a shallow degree of inspiration. IMPRESSION: 1. Support equipment appears satisfactorily positioned. New right IJ line extends into the low SVC. 2.  No pneumothorax. 3. Stable cardiomegaly and mild basilar airspace opacities. Electronically Signed   By: Andreas Newport M.D.   On: 04/30/2016 21:56   Portable Chest Xray  Result Date: 04/30/2016 CLINICAL DATA:  Cardiorespiratory arrest.  Intubation. EXAM: PORTABLE CHEST 1 VIEW COMPARISON:  Chest x-rays earlier today 5:15 a.m., yesterday and previously. CTA chest yesterday. FINDINGS: Endotracheal tube tip in satisfactory position projecting approximately 7 cm above carina. Nasogastric tube courses below the diaphragm into the stomach. External pacing pads are present. Cardiac silhouette markedly enlarged, unchanged. Interval resolution of the mild  interstitial pulmonary edema and the atelectasis in the right lower lobe since this morning. Only minimal atelectasis persists at the right lung base. Lungs otherwise clear. IMPRESSION: 1. Support apparatus satisfactory. 2. Resolution of interstitial pulmonary edema since earlier today. 3. Improved aeration in the right lower lobe since earlier today with only minimal atelectasis persisting. 4. No new abnormalities. Electronically Signed   By: Evangeline Dakin M.D.   On: 04/30/2016 20:05    STUDIES:  None  CULTURES: None  ANTIBIOTICS: None  SIGNIFICANT EVENTS: Cardiac arrest Intubation  LINES/TUBES: ETT 1/5 >> R IJ CVC Foley  DISCUSSION: 51 y/o man with ICM p/w decompensated heart failure and cardiac arrest in setting of amio bolus at [place] / PLAN:  PULMONARY A: Acute Respiratory failure requiring mechanical ventilation P:   SBTs with goal extubation  CARDIOVASCULAR A:  ICM Cardiac arrest AF w/ RVR P:  Defer to cardiology, agree w/ dobutamine plus diuresis. ? amio vs ICD  RENAL A:   Renal insuffiencey, unclear chronicity ? CKD P:   Trend lytes and renal function  GASTROINTESTINAL A:   Mild elevation in LFTs P:   Likely related to transient circulatory arrest.  HEMATOLOGIC A:   No active issues P:   INFECTIOUS A:   No active issues P:    ENDOCRINE A:   No active issues   P:    NEUROLOGIC A:   Appears neurologically intact P:   No role for cooling  FAMILY  - Updates: at bedside  - Inter-disciplinary family meet or Palliative Care meeting due by:  05/09/15  Cc time x 25m  Kara Mead MD. Shade Flood. [facility] Pager [phone] If no response call [phone]    05/01/2016, 9:10 AM

## 2016-05-02 ENCOUNTER — Inpatient Hospital Stay (HOSPITAL_COMMUNITY): Payer: Medicare HMO

## 2016-05-02 DIAGNOSIS — I480 Paroxysmal atrial fibrillation: Secondary | ICD-10-CM

## 2016-05-02 DIAGNOSIS — I509 Heart failure, unspecified: Secondary | ICD-10-CM

## 2016-05-02 LAB — CBC
HEMATOCRIT: 32.5 % — AB (ref 39.0–52.0)
Hemoglobin: 10.5 g/dL — ABNORMAL LOW (ref 13.0–17.0)
MCH: 27 pg (ref 26.0–34.0)
MCHC: 32.3 g/dL (ref 30.0–36.0)
MCV: 83.5 fL (ref 78.0–100.0)
PLATELETS: 189 10*3/uL (ref 150–400)
RBC: 3.89 MIL/uL — ABNORMAL LOW (ref 4.22–5.81)
RDW: 16.7 % — AB (ref 11.5–15.5)
WBC: 10.4 10*3/uL (ref 4.0–10.5)

## 2016-05-02 LAB — BASIC METABOLIC PANEL
Anion gap: 9 (ref 5–15)
BUN: 17 mg/dL (ref 6–20)
CALCIUM: 8.3 mg/dL — AB (ref 8.9–10.3)
CHLORIDE: 103 mmol/L (ref 101–111)
CO2: 25 mmol/L (ref 22–32)
CREATININE: 1.33 mg/dL — AB (ref 0.61–1.24)
GFR, EST NON AFRICAN AMERICAN: 60 mL/min — AB (ref >60)
Glucose, Bld: 82 mg/dL (ref 65–99)
Potassium: 3.2 mmol/L — ABNORMAL LOW (ref 3.5–5.1)
SODIUM: 137 mmol/L (ref 135–145)

## 2016-05-02 LAB — ECHOCARDIOGRAM COMPLETE
Height: 65 in
WEIGHTICAEL: 2469.15 [oz_av]

## 2016-05-02 LAB — FERRITIN: FERRITIN: 121 ng/mL (ref 24–336)

## 2016-05-02 LAB — COOXEMETRY PANEL
CARBOXYHEMOGLOBIN: 1.5 % (ref 0.5–1.5)
Methemoglobin: 1.1 % (ref 0.0–1.5)
O2 SAT: 64.1 %
TOTAL HEMOGLOBIN: 9.7 g/dL — AB (ref 12.0–16.0)

## 2016-05-02 LAB — IRON AND TIBC
IRON: 13 ug/dL — AB (ref 45–182)
Saturation Ratios: 5 % — ABNORMAL LOW (ref 17.9–39.5)
TIBC: 251 ug/dL (ref 250–450)
UIBC: 238 ug/dL

## 2016-05-02 LAB — RETICULOCYTES
RBC.: 3.89 MIL/uL — ABNORMAL LOW (ref 4.22–5.81)
Retic Count, Absolute: 120.6 10*3/uL (ref 19.0–186.0)
Retic Ct Pct: 3.1 % (ref 0.4–3.1)

## 2016-05-02 LAB — HEPARIN LEVEL (UNFRACTIONATED): HEPARIN UNFRACTIONATED: 0.37 [IU]/mL (ref 0.30–0.70)

## 2016-05-02 LAB — FOLATE: Folate: 14.8 ng/mL (ref >5.9)

## 2016-05-02 LAB — VITAMIN B12: Vitamin B-12: 893 pg/mL (ref 180–914)

## 2016-05-02 MED ORDER — SPIRONOLACTONE 25 MG PO TABS
25.0000 mg | ORAL_TABLET | Freq: Every day | ORAL | Status: DC
  Administered 2016-05-02 – 2016-05-13 (×11): 25 mg via ORAL
  Filled 2016-05-02 (×11): qty 1

## 2016-05-02 MED ORDER — ROSUVASTATIN CALCIUM 10 MG PO TABS
10.0000 mg | ORAL_TABLET | Freq: Every day | ORAL | Status: DC
  Administered 2016-05-02 – 2016-05-09 (×8): 10 mg via ORAL
  Filled 2016-05-02 (×8): qty 1

## 2016-05-02 MED ORDER — ISOSORB DINITRATE-HYDRALAZINE 20-37.5 MG PO TABS
0.5000 | ORAL_TABLET | Freq: Three times a day (TID) | ORAL | Status: DC
  Administered 2016-05-02 (×3): 0.5 via ORAL
  Filled 2016-05-02 (×4): qty 1

## 2016-05-02 MED ORDER — POTASSIUM CHLORIDE CRYS ER 20 MEQ PO TBCR
40.0000 meq | EXTENDED_RELEASE_TABLET | Freq: Three times a day (TID) | ORAL | Status: AC
  Administered 2016-05-02 (×3): 40 meq via ORAL
  Filled 2016-05-02 (×3): qty 2

## 2016-05-02 MED ORDER — SODIUM CHLORIDE 0.9 % IV SOLN
510.0000 mg | INTRAVENOUS | Status: DC
  Administered 2016-05-02: 510 mg via INTRAVENOUS
  Filled 2016-05-02 (×3): qty 17

## 2016-05-02 NOTE — Evaluation (Signed)
Physical Therapy Evaluation Patient Details Name: David [name] MRN: [id number] DOB: [birth year] Today's Date: 05/02/2016   History of Present Illness  Pt transferred from [facility] with heart failure and sufferred cardiac arrest. Intubated prior to transfer and extubated 1/7.  PMH - cardiomyopathy, htn, CAD  Clinical Impression  Pt admitted with above diagnosis and presents to PT with functional limitations due to deficits listed below (See PT problem list). Pt needs skilled PT to maximize independence and safety to allow discharge to home with wife. Expect pt will progress rapidly and will not need PT after DC.     Follow Up Recommendations No PT follow up    Equipment Recommendations  None recommended by PT    Recommendations for Other Services       Precautions / Restrictions Precautions Precautions: None Restrictions Weight Bearing Restrictions: No      Mobility  Bed Mobility Overal bed mobility: Modified Independent             General bed mobility comments: Incr time  Transfers Overall transfer level: Needs assistance Equipment used: None Transfers: Sit to/from Stand Sit to Stand: Min assist         General transfer comment: Assist for balance  Ambulation/Gait Ambulation/Gait assistance: Min assist;Min guard Ambulation Distance (Feet): 150 Feet Assistive device: None Gait Pattern/deviations: Step-through pattern;Decreased stride length;Drifts right/left Gait velocity: decr Gait velocity interpretation: Below normal speed for age/gender General Gait Details: Slightly unsteady gait initially requiring min A and then progressing to min guard. SpO2 96% and above on RA  Stairs            Wheelchair Mobility    Modified Rankin (Stroke Patients Only)       Balance Overall balance assessment: Needs assistance Sitting-balance support: No upper extremity supported;Feet supported Sitting balance-Leahy Scale: Good     Standing balance  support: No upper extremity supported Standing balance-Leahy Scale: Fair                               Pertinent Vitals/Pain Pain Assessment: No/denies pain    Home Living Family/patient expects to be discharged to:: Private residence Living Arrangements: Alone Available Help at Discharge: Family Type of Home: House Home Access: Level entry     Home Layout: One level Home Equipment: None      Prior Function Level of Independence: Independent               Hand Dominance   Dominant Hand: Right    Extremity/Trunk Assessment   Upper Extremity Assessment Upper Extremity Assessment: Overall WFL for tasks assessed    Lower Extremity Assessment Lower Extremity Assessment: Generalized weakness       Communication   Communication: No difficulties  Cognition Arousal/Alertness: Awake/alert Behavior During Therapy: WFL for tasks assessed/performed Overall Cognitive Status: Within Functional Limits for tasks assessed                      General Comments      Exercises     Assessment/Plan    PT Assessment Patient needs continued PT services  PT Problem List Decreased strength;Decreased activity tolerance;Decreased balance;Decreased mobility          PT Treatment Interventions Gait training;Functional mobility training;Therapeutic activities;Therapeutic exercise;Balance training;Patient/family education;DME instruction    PT Goals (Current goals can be found in the Care Plan section)  Acute Rehab PT Goals Patient Stated Goal: return home PT Goal Formulation: With  patient Time For Goal Achievement: 05/09/16 Potential to Achieve Goals: Good    Frequency Min 3X/week   Barriers to discharge        Co-evaluation               End of Session   Activity Tolerance: Patient tolerated treatment well Patient left: in chair;with call bell/phone within reach Nurse Communication: Mobility status         Time: 5015-8682 PT Time  Calculation (min) (ACUTE ONLY): 18 min   Charges:   PT Evaluation $PT Eval Moderate Complexity: 1 Procedure     PT G CodesShary Decamp [place] 05-30-16, 1:38 PM [employer] PT [phone]

## 2016-05-02 NOTE — Progress Notes (Signed)
C/o intermittent pain in back of lower legs. Tylenol given for chest soreness and leg pain.

## 2016-05-02 NOTE — Progress Notes (Signed)
[place] for Heparin  Indication: chest pain/ACS, s/p cardiac arrest  Allergies  Allergen Reactions  . Plavix [Clopidogrel Bisulfate] Hives   Patient Measurements: Height: 5\' 5"  (165.1 cm) Weight: 154 lb 5.2 oz (70 kg) IBW/kg (Calculated) : 61.5  Vital Signs: Temp: 98 F (36.7 C) (01/08 0432) Temp Source: Oral (01/08 0432) BP: 136/76 (01/08 0432) Pulse Rate: 103 (01/08 0500)  Labs:  Recent Labs  04/30/16 1950 05/01/16 0150 05/01/16 0444  05/01/16 1410 05/01/16 1625 05/01/16 2324 05/02/16 0500  HGB 12.4*  --   --   --   --  9.3*  --  10.5*  HCT 37.6*  --   --   --   --  28.5*  --  32.5*  PLT 253  --   --   --   --  174  --  189  LABPROT 17.7*  --   --   --   --   --   --   --   INR 1.44  --   --   --   --   --   --   --   HEPARINUNFRC  --   --   --   < > 0.65  --  0.35 0.37  CREATININE 1.67*  --  1.62*  --   --   --   --  1.33*  TROPONINI 2.01* 1.89* 1.62*  --   --   --   --   --   < > = values in this interval not displayed.   Medical History: Past Medical History:  Diagnosis Date  . CAD (coronary artery disease) 2009   AMI in 12/2007 with PCI to LAD, staged PCI to  mid/distal RCA, NSTEMI in 02/2009 with BMS to LCx  . HLD (hyperlipidemia)   . HTN (hypertension)   . Ischemic cardiomyopathy    Admitted in 07/2010 with CHF exacerbation   . DOBUTamine 5 mcg/kg/min (04/30/16 2153)  . heparin 950 Units/hr (05/01/16 2351)    Assessment: 51 yo M s/p cardiac arrest at outside hospital, on IV heparin for elevated troponin. Heparin level remains therapeutic at 0.37 after rate increase. Hgb low stable, Plt wnl, no bleeding noted.  Goal of Therapy:  Heparin level 0.3-0.7 units/ml Monitor platelets by anticoagulation protocol: Yes   Plan:  Continue IV heparin at 950 units/hr Daily heparin level, CBC Monitor for s/sx of bleeding   Gwenlyn Perking, PharmD PGY1 Pharmacy Resident Pager: [phone] 05/02/2016 6:59 AM

## 2016-05-02 NOTE — Progress Notes (Signed)
Patient Name: David [name] Date of Encounter: 05/02/2016  Primary Cardiologist: Dr Geraldo Pitter [facility] Problem List     Principal Problem:   Acute on chronic systolic and diastolic heart failure, NYHA class 4 (HCC) Active Problems:   CAD (coronary artery disease), native coronary artery   Hyperlipidemia   Acute on chronic combined systolic and diastolic CHF (congestive heart failure) (HCC)     Subjective   Pt is pleasant and denies substernal chest pain or shortness of breath. David [name] does have soreness in the ribs presumably due to CPR. David [name] says that David [name] is ready to have the ICD implanted "or whatever it is that you have to do".  Inpatient Medications    Scheduled Meds: . aspirin EC  81 mg Oral Daily  . chlorhexidine gluconate (MEDLINE KIT)  15 mL Mouth Rinse BID  . chlorhexidine gluconate (MEDLINE KIT)  15 mL Mouth Rinse BID  . Chlorhexidine Gluconate Cloth  6 each Topical Q0600  . digoxin  0.125 mg Oral Daily  . ferumoxytol  510 mg Intravenous Weekly  . furosemide  80 mg Intravenous [id number]  . Influenza vac split quadrivalent PF  0.5 mL Intramuscular Tomorrow-1000  . isosorbide-hydrALAZINE  0.5 tablet Oral TID  . mouth rinse  15 mL Mouth Rinse QID  . mupirocin ointment  1 application Nasal BID  . pneumococcal 23 valent vaccine  0.5 mL Intramuscular Tomorrow-1000  . potassium chloride  40 mEq Oral TID  . rosuvastatin  10 mg Oral q1800  . sodium chloride flush  10-40 mL Intracatheter Q12H  . spironolactone  25 mg Oral Daily  . traZODone  100 mg Oral QHS   Continuous Infusions: . DOBUTamine 5 mcg/kg/min (05/02/16 0957)  . heparin 950 Units/hr (05/01/16 2351)   PRN Meds: Place/Maintain arterial line **AND** sodium chloride, sodium chloride, acetaminophen, ondansetron (ZOFRAN) IV, sodium chloride flush   Vital Signs    Vitals:   05/02/16 0800 05/02/16 0900 05/02/16 1000 05/02/16 1200  BP:    117/87  Pulse: (!) 102 (!) 129 (!) 58   Resp: 18 (!) 21 18     Temp:    98.2 F (36.8 C)  TempSrc:    Oral  SpO2: 99% 98% 98%   Weight:      Height:        Intake/Output Summary (Last 24 hours) at 05/02/16 1326 Last data filed at 05/02/16 1127  Gross per 24 hour  Intake          1523.88 ml  Output             4330 ml  Net         -2806.12 ml   Filed Weights   04/30/16 1949 05/01/16 0500 05/02/16 0432  Weight: 167 lb 14.4 oz (76.2 kg) 167 lb 11.2 oz (76.1 kg) 154 lb 5.2 oz (70 kg)    Physical Exam    GEN: Pleasant AA male, in no acute distress.  HEENT: Grossly normal.  Neck: Supple, no JVD, carotid bruits, or masses. Cardiac: RRR, no murmurs, rubs, or gallops. No clubbing, cyanosis, edema.  Radials/DP/PT 2+ and equal bilaterally.  Respiratory:  Respirations regular and unlabored, clear to auscultation bilaterally. GI: Soft, nontender, nondistended, BS + x 4. MS: no deformity or atrophy. Skin: warm and dry, no rash. Neuro:  Strength and sensation are intact. Psych: AAOx3.  Normal affect.  Labs    CBC  Recent Labs  04/30/16 1950 05/01/16 1625 05/02/16 0500  WBC 18.7* 10.8* 10.4  NEUTROABS 16.2*  --   --   HGB 12.4* 9.3* 10.5*  HCT 37.6* 28.5* 32.5*  MCV 81.9 83.1 83.5  PLT 253 174 263   Basic Metabolic Panel  Recent Labs  04/30/16 1950 05/01/16 0444 05/01/16 1420 05/02/16 0500  NA 138 142  --  137  K 4.0 4.1  --  3.2*  CL 105 107  --  103  CO2 21* 25  --  25  GLUCOSE 106* 103*  --  82  BUN 21* 22*  --  17  CREATININE 1.67* 1.62*  --  1.33*  CALCIUM 8.0* 8.2*  --  8.3*  MG 2.1  --  1.7  --   PHOS 4.3  --   --   --    Liver Function Tests  Recent Labs  04/30/16 1950  AST 94*  ALT 149*  ALKPHOS 132*  BILITOT 1.0  PROT 6.4*  ALBUMIN 2.9*   No results for input(s): LIPASE, AMYLASE in the last 72 hours. Cardiac Enzymes  Recent Labs  04/30/16 1950 05/01/16 0150 05/01/16 0444  TROPONINI 2.01* 1.89* 1.62*   BNP Invalid input(s): POCBNP D-Dimer No results for input(s): DDIMER in the last 72  hours. Hemoglobin A1C No results for input(s): HGBA1C in the last 72 hours. Fasting Lipid Panel No results for input(s): CHOL, HDL, LDLCALC, TRIG, CHOLHDL, LDLDIRECT in the last 72 hours. Thyroid Function Tests No results for input(s): TSH, T4TOTAL, T3FREE, THYROIDAB in the last 72 hours.  Invalid input(s): FREET3  Telemetry    Sinus tachycardia with rates in the low 100's-110's. In and out of atrial fib and occ short runs of NSVT- Personally Reviewed  ECG    No new tracings  Radiology    Dg Chest Port 1 View  Result Date: 04/30/2016 CLINICAL DATA:  Hypertension. Respiratory failure. Central line placement. EXAM: PORTABLE CHEST 1 VIEW COMPARISON:  04/30/2016 at 19:49 FINDINGS: New right jugular central line extends into the low SVC. Endotracheal tube is 5.8 cm above the carina. Nasogastric tube extends into the stomach. No pneumothorax. Unchanged cardiomegaly. Mild basilar airspace opacities, accentuated by a shallow degree of inspiration. IMPRESSION: 1. Support equipment appears satisfactorily positioned. New right IJ line extends into the low SVC. 2. No pneumothorax. 3. Stable cardiomegaly and mild basilar airspace opacities. Electronically Signed   By: Andreas Newport M.D.   On: 04/30/2016 21:56   Portable Chest Xray  Result Date: 04/30/2016 CLINICAL DATA:  Cardiorespiratory arrest.  Intubation. EXAM: PORTABLE CHEST 1 VIEW COMPARISON:  Chest x-rays earlier today 5:15 a.m., yesterday and previously. CTA chest yesterday. FINDINGS: Endotracheal tube tip in satisfactory position projecting approximately 7 cm above carina. Nasogastric tube courses below the diaphragm into the stomach. External pacing pads are present. Cardiac silhouette markedly enlarged, unchanged. Interval resolution of the mild interstitial pulmonary edema and the atelectasis in the right lower lobe since this morning. Only minimal atelectasis persists at the right lung base. Lungs otherwise clear. IMPRESSION: 1. Support  apparatus satisfactory. 2. Resolution of interstitial pulmonary edema since earlier today. 3. Improved aeration in the right lower lobe since earlier today with only minimal atelectasis persisting. 4. No new abnormalities. Electronically Signed   By: Evangeline Dakin M.D.   On: 04/30/2016 20:05    Cardiac Studies   Echo pending.  02/2011 cardiac cath- Mid RCA BMS. (Full report can be found under media tab)  Patient Profile     51 yo male patient with past medical history of CAD with MI X3 and multiple  stents, ischemic cardiomyopathy who presented to [facility] for 2 week history of worsening heart failure symptoms. David [name] was in AF with RVR and was treated with amio, but developed bradycardia and cardiac arrest. David [name] was resuscitated and takent to [facility]. David [name] was stabilized and is being treated for heart failurea nd atrial fib. Heart failure team has been consulted.  Assessment & Plan    1. Chronic systolic heart failure -History of multiple MI's from 1020-2013 and multiple stents. And subsequent Ischemic cardiomyopathy. -Presented to [facility] on 04/29/16 with 2 week history of worsening heart failure symptoms. David [name] developed symptomatic tachycardia and received rapid amiodarone leading to bradycardia and loss of pulse. David [name] was transferred to [facility]. David [name] was intubated from 04/30/16 to 05/01/16.  Vasopressors were used and have been weaned off. -David [name] was admitted to [facility] by the cardiology fellow and the heart failure team was consulted -David [name] has been diuresed with lasix 80 mg IV TID (has received 4 doses thus far) and aldactone -I&O is -1724 for today and -2916 since admission -Weight down 167 lb 14 oz-->167 lb 11 oz-->154 lb 5 oz -Heart failure team is considering ICD -Current echo results EF 15 - 20%.   2. Atrial fibrillation -On admit symptomatic tachycardia treated with rapid dose of amiodarone leading to PEA arrest. Now sinus tachycardia with runs of atrial fib and occ 3-4 beat  runs of NSVT -On digoxin 0.125 mg daily -No beta blocker at present -Anticoagulated with heparin infusion -CHA2DS2-VASc score 3 (CHF, HTN,  Vascular disease), will need oral anticoagulant   3. Acute respiratory failure -In setting of heart failure exacerbation, rapid atrial fib and PEA arrest  -Now breathing better  4. CAD -Hx of at least 3 MI's and multiple stents between 2010 and 2013 -Seen at [facility] in [place] but wants to transition here -Troponins this admission 2.01, 1.89, 1.62- likely related to heart failure and S/P cardiac arrest with CPR -Continue aspirin and statin -Bidil has just been started at low dose by heart failure  5. CKD stage III -Creatinine improving  1.33 (1/8), was 1.67 on 1/6 -K+ 3.2 today 1/8, Repletion with KDur 40 mEq TID  6. Anemia -Watching Hgb -Feraheme being administered per heart failure team -FOBT ordered  7. Tobacco use -Advise smoking cessation  Signed, Daune Perch, NP  05/02/2016, 1:26 PM   History and all data above reviewed.  Patient examined.  I agree with the findings as above. David [name] feels good. Still with rib soreness.  CVP is 9.  CVP is 64.  The patient exam reveals AFB:XUXYB but regular  ,  Lungs: Clear  ,  Abd: Positive bowel sounds, no rebound no guarding, Ext No edema   .  All available labs, radiology testing, previous records reviewed. Agree with documented assessment and plan. CHF:  Volume improved. Continue current diuresis.  Probably stop dobutamine in the AM.  Wants to discuss timing of ICD.  R/LHC Tuesday/Wed and then consider ICD before discharge.  Hold on DOAC until invasive work is completed.      Jeneen Rinks Alida Greiner  5:07 PM  05/02/2016

## 2016-05-02 NOTE — Progress Notes (Signed)
Resident ccm DR Randell Patient spoke to cards - and pccm signing off  Dr. Brand Males, M.D., [facility].C.P Pulmonary and Critical Care Medicine Staff Physician [place] [facility] Pager: [phone], If no answer or between  15:00h - 7:00h: call [phone]  05/02/2016 12:45 PM  '

## 2016-05-02 NOTE — Progress Notes (Signed)
Advanced Heart Failure Rounding Note  Cardiologist: Dr. Geraldo Pitter ([place])  Subjective:    He presented to [facility] on 1/5 after ~2 weeks of worsening HF symptoms.  In OSH Ed, he developed symptomatic tachycardia (AFib/AFl) and received rapid IV amiodarone bolus leading to bradycardia and loss of pulse. Required 1-2 rounds of CPR and intubation. He was started on Dopa+Dobutamine. Transferred to [facility] and transitioned from Dopamine to Levophed.  Extubated 05/01/2016. Yesterday norepi stopped and he continued on dobutamine at 5 mcg. Also diuresed with IV lasix. Negative 1.2 liters. SBP running high on A line but 20 points lower with cuff.   Todays CO-OX is 64%. Iron stores low.   Denies SOB/CP.    Objective:   Weight Range:  Vital Signs:   Temp:  [98 F (36.7 C)-98.7 F (37.1 C)] 98 F (36.7 C) (01/08 0432) Pulse Rate:  [93-117] 103 (01/08 0500) Resp:  [11-26] 19 (01/08 0500) BP: (90-136)/(68-88) 136/76 (01/08 0432) SpO2:  [92 %-100 %] 97 % (01/08 0500) Arterial Line BP: (91-145)/(46-70) 145/70 (01/08 0500) FiO2 (%):  [40 %] 40 % (01/07 0918) Weight:  [154 lb 5.2 oz (70 kg)] 154 lb 5.2 oz (70 kg) (01/08 0432) Last BM Date:  (UTA)  Weight change: Filed Weights   04/30/16 1949 05/01/16 0500 05/02/16 0432  Weight: 167 lb 14.4 oz (76.2 kg) 167 lb 11.2 oz (76.1 kg) 154 lb 5.2 oz (70 kg)    Intake/Output:   Intake/Output Summary (Last 24 hours) at 05/02/16 0745 Last data filed at 05/02/16 0500  Gross per 24 hour  Intake          1318.78 ml  Output             2555 ml  Net         -1236.22 ml     Physical Exam: CPVP 15 General:  Lying in bed. No resp difficulty HEENT: normal Neck: supple. RIJ TLC. JVP to ear . Carotids 2+ bilat; no bruits. No lymphadenopathy or thryomegaly appreciated. Cor: PMI laterally displaced. Regular/tachy. No s3 Lungs: clear Abdomen: soft, nontender, nondistended. No hepatosplenomegaly. No bruits or masses. Good bowel  sounds. Extremities: no cyanosis, clubbing, rash, edema Neuro: alert & orientedx3, cranial nerves grossly intact. moves all 4 extremities w/o difficulty. Affect pleasant  Telemetry: ST 100s  Labs: Basic Metabolic Panel:  Recent Labs Lab 04/30/16 1950 05/01/16 0444 05/01/16 1420 05/02/16 0500  NA 138 142  --  137  K 4.0 4.1  --  3.2*  CL 105 107  --  103  CO2 21* 25  --  25  GLUCOSE 106* 103*  --  82  BUN 21* 22*  --  17  CREATININE 1.67* 1.62*  --  1.33*  CALCIUM 8.0* 8.2*  --  8.3*  MG 2.1  --  1.7  --   PHOS 4.3  --   --   --     Liver Function Tests:  Recent Labs Lab 04/30/16 1950  AST 94*  ALT 149*  ALKPHOS 132*  BILITOT 1.0  PROT 6.4*  ALBUMIN 2.9*   No results for input(s): LIPASE, AMYLASE in the last 168 hours. No results for input(s): AMMONIA in the last 168 hours.  CBC:  Recent Labs Lab 04/30/16 1950 05/01/16 1625 05/02/16 0500  WBC 18.7* 10.8* 10.4  NEUTROABS 16.2*  --   --   HGB 12.4* 9.3* 10.5*  HCT 37.6* 28.5* 32.5*  MCV 81.9 83.1 83.5  PLT 253 174 189  Cardiac Enzymes:  Recent Labs Lab 04/30/16 1950 05/01/16 0150 05/01/16 0444  TROPONINI 2.01* 1.89* 1.62*    BNP: BNP (last 3 results) No results for input(s): BNP in the last 8760 hours.  ProBNP (last 3 results) No results for input(s): PROBNP in the last 8760 hours.    Other results:  Imaging: Dg Chest Port 1 View  Result Date: 04/30/2016 CLINICAL DATA:  Hypertension. Respiratory failure. Central line placement. EXAM: PORTABLE CHEST 1 VIEW COMPARISON:  04/30/2016 at 19:49 FINDINGS: New right jugular central line extends into the low SVC. Endotracheal tube is 5.8 cm above the carina. Nasogastric tube extends into the stomach. No pneumothorax. Unchanged cardiomegaly. Mild basilar airspace opacities, accentuated by a shallow degree of inspiration. IMPRESSION: 1. Support equipment appears satisfactorily positioned. New right IJ line extends into the low SVC. 2. No  pneumothorax. 3. Stable cardiomegaly and mild basilar airspace opacities. Electronically Signed   By: Andreas Newport M.D.   On: 04/30/2016 21:56   Portable Chest Xray  Result Date: 04/30/2016 CLINICAL DATA:  Cardiorespiratory arrest.  Intubation. EXAM: PORTABLE CHEST 1 VIEW COMPARISON:  Chest x-rays earlier today 5:15 a.m., yesterday and previously. CTA chest yesterday. FINDINGS: Endotracheal tube tip in satisfactory position projecting approximately 7 cm above carina. Nasogastric tube courses below the diaphragm into the stomach. External pacing pads are present. Cardiac silhouette markedly enlarged, unchanged. Interval resolution of the mild interstitial pulmonary edema and the atelectasis in the right lower lobe since this morning. Only minimal atelectasis persists at the right lung base. Lungs otherwise clear. IMPRESSION: 1. Support apparatus satisfactory. 2. Resolution of interstitial pulmonary edema since earlier today. 3. Improved aeration in the right lower lobe since earlier today with only minimal atelectasis persisting. 4. No new abnormalities. Electronically Signed   By: Evangeline Dakin M.D.   On: 04/30/2016 20:05     Medications:     Scheduled Medications: . aspirin EC  81 mg Oral Daily  . chlorhexidine gluconate (MEDLINE KIT)  15 mL Mouth Rinse BID  . chlorhexidine gluconate (MEDLINE KIT)  15 mL Mouth Rinse BID  . Chlorhexidine Gluconate Cloth  6 each Topical Q0600  . digoxin  0.125 mg Oral Daily  . furosemide  80 mg Intravenous [id number]  . Influenza vac split quadrivalent PF  0.5 mL Intramuscular Tomorrow-1000  . mouth rinse  15 mL Mouth Rinse QID  . mupirocin ointment  1 application Nasal BID  . pneumococcal 23 valent vaccine  0.5 mL Intramuscular Tomorrow-1000  . simvastatin  40 mg Oral q1800  . sodium chloride flush  10-40 mL Intracatheter Q12H  . spironolactone  12.5 mg Oral Daily  . traZODone  100 mg Oral QHS    Infusions: . DOBUTamine 5 mcg/kg/min (04/30/16  2153)  . heparin 950 Units/hr (05/01/16 2351)    PRN Medications: Place/Maintain arterial line **AND** sodium chloride, sodium chloride, acetaminophen, ondansetron (ZOFRAN) IV, sodium chloride flush   Assessment:   1. Cardiogenic shock 2. PEA arrest 3. Acute on chronic systolic HF   --EF < 61% due to iCM 4. Acute hypoxemic respiratory failure 5. Elevated troponin 6. PAF 7. CAD    --s/p ('09 PCI to LAD, mRCA, '10 PCI to Lcx and '12 mRCA),  8. CKD, stage III 9. Tobacco use    -says he is smoking 1/2 cigarette per day 10. Anemia  11. Hypokalemia-   Plan/Discussion:    Repeat ECHO pending. Todays CO-OX is 64%. Continue dobutamine 5 mcg. Will not wean until fully diuresed. Increase spiro to 25  mg daily. Renal function ok. Plan RHC/LHC once fully diuresed. . No b-blocker or ACE/ARB/ARNI for now. Add 1/2 tab bidil three times daily. Also consider ICD in near future.   By history it seems like he has had worsening HF for 2-3 weeks without angina. Suspect PAF may have put him over the edge. Developed PEA with amio bolus. Now back in NSR. Will continue heparin and will need DOAC on d/c.   Iron stores low- Give Feraheme. Watch hemoglobin closely.  FOBT.   OOB today.    Length of Stay: 2   Amy Clegg NP-C  05/02/2016, 7:45 AM  Advanced Heart Failure Team Pager [phone] (M-F; 7a - 4p)  Please contact [facility] for night-coverage after hours (4p -7a ) and weekends on [email or web address]  Patient seen and examined with Darrick Grinder, NP. We discussed all aspects of the encounter. I agree with the assessment and plan as stated above.   He is diuresing with dobutamine and IV lasix. Co-ox ok. CVP still elevated at 15. Will continue dobutamine and IV diuresis. Renal function improving. BP coming up. Will increase spiro and add Bidil. No ACE or bblocker yet.   Maintaining sinus. Continue heparin. Will need DOAC at d/c. Has iron def anemia so will likely need GI w/u at some point. Start PPI.    Will need R/L cath once fully diuresed and off intoropes. May need advanced therapies at some point.   I called Dr. Estill Bamberg and updated him .  Bomani Oommen,MD 6:31 PM

## 2016-05-02 NOTE — Progress Notes (Signed)
  Echocardiogram 2D Echocardiogram has been performed.  David [name] 05/02/2016, 11:03 AM

## 2016-05-03 LAB — CBC
HCT: 33.1 % — ABNORMAL LOW (ref 39.0–52.0)
Hemoglobin: 11.1 g/dL — ABNORMAL LOW (ref 13.0–17.0)
MCH: 27.3 pg (ref 26.0–34.0)
MCHC: 33.5 g/dL (ref 30.0–36.0)
MCV: 81.3 fL (ref 78.0–100.0)
PLATELETS: 231 10*3/uL (ref 150–400)
RBC: 4.07 MIL/uL — ABNORMAL LOW (ref 4.22–5.81)
RDW: 15.7 % — AB (ref 11.5–15.5)
WBC: 11 10*3/uL — AB (ref 4.0–10.5)

## 2016-05-03 LAB — COOXEMETRY PANEL
CARBOXYHEMOGLOBIN: 1.6 % — AB (ref 0.5–1.5)
METHEMOGLOBIN: 1.4 % (ref 0.0–1.5)
O2 SAT: 59.1 %
TOTAL HEMOGLOBIN: 10.8 g/dL — AB (ref 12.0–16.0)

## 2016-05-03 LAB — BASIC METABOLIC PANEL
ANION GAP: 9 (ref 5–15)
BUN: 12 mg/dL (ref 6–20)
CALCIUM: 8.8 mg/dL — AB (ref 8.9–10.3)
CHLORIDE: 104 mmol/L (ref 101–111)
CO2: 25 mmol/L (ref 22–32)
Creatinine, Ser: 1.25 mg/dL — ABNORMAL HIGH (ref 0.61–1.24)
GFR calc non Af Amer: >60 mL/min (ref >60)
Glucose, Bld: 96 mg/dL (ref 65–99)
POTASSIUM: 3.8 mmol/L (ref 3.5–5.1)
Sodium: 138 mmol/L (ref 135–145)

## 2016-05-03 LAB — BRAIN NATRIURETIC PEPTIDE: B NATRIURETIC PEPTIDE 5: 1034.8 pg/mL — AB (ref 0.0–100.0)

## 2016-05-03 LAB — HEPARIN LEVEL (UNFRACTIONATED)
HEPARIN UNFRACTIONATED: 0.4 [IU]/mL (ref 0.30–0.70)
Heparin Unfractionated: 0.21 IU/mL — ABNORMAL LOW (ref 0.30–0.70)
Heparin Unfractionated: 0.21 IU/mL — ABNORMAL LOW (ref 0.30–0.70)

## 2016-05-03 MED ORDER — HEPARIN BOLUS VIA INFUSION
1000.0000 [IU] | Freq: Once | INTRAVENOUS | Status: AC
  Administered 2016-05-03: 1000 [IU] via INTRAVENOUS
  Filled 2016-05-03: qty 1000

## 2016-05-03 MED ORDER — ISOSORB DINITRATE-HYDRALAZINE 20-37.5 MG PO TABS
1.0000 | ORAL_TABLET | Freq: Three times a day (TID) | ORAL | Status: DC
  Administered 2016-05-03: 1 via ORAL
  Filled 2016-05-03: qty 1

## 2016-05-03 MED ORDER — TRAMADOL HCL 50 MG PO TABS
50.0000 mg | ORAL_TABLET | Freq: Four times a day (QID) | ORAL | Status: DC | PRN
  Administered 2016-05-03 – 2016-05-04 (×3): 50 mg via ORAL
  Filled 2016-05-03 (×3): qty 1

## 2016-05-03 NOTE — Progress Notes (Signed)
[facility] for Heparin  Indication: chest pain/ACS, s/p cardiac arrest  Allergies  Allergen Reactions  . Plavix [Clopidogrel Bisulfate] Hives   Patient Measurements: Height: 5\' 5"  (165.1 cm) Weight: 160 lb 6.4 oz (72.8 kg) IBW/kg (Calculated) : 61.5  Vital Signs: Temp: 98 F (36.7 C) (01/09 1142) Temp Source: Oral (01/09 1142) BP: 88/65 (01/09 1400) Pulse Rate: 122 (01/09 0500)  Labs:  Recent Labs  04/30/16 1950 05/01/16 0150 05/01/16 0444  05/01/16 1625  05/02/16 0500 05/03/16 0435 05/03/16 1145  HGB 12.4*  --   --   --  9.3*  --  10.5* 11.1*  --   HCT 37.6*  --   --   --  28.5*  --  32.5* 33.1*  --   PLT 253  --   --   --  174  --  189 231  --   LABPROT 17.7*  --   --   --   --   --   --   --   --   INR 1.44  --   --   --   --   --   --   --   --   HEPARINUNFRC  --   --   --   < >  --   < > 0.37 0.21* 0.21*  CREATININE 1.67*  --  1.62*  --   --   --  1.33* 1.25*  --   TROPONINI 2.01* 1.89* 1.62*  --   --   --   --   --   --   < > = values in this interval not displayed.   Medical History: Past Medical History:  Diagnosis Date  . CAD (coronary artery disease) 2009   AMI in 12/2007 with PCI to LAD, staged PCI to  mid/distal RCA, NSTEMI in 02/2009 with BMS to LCx  . HLD (hyperlipidemia)   . HTN (hypertension)   . Ischemic cardiomyopathy    Admitted in 07/2010 with CHF exacerbation   . DOBUTamine 2.5 mcg/kg/min (05/03/16 1100)  . heparin 1,100 Units/hr (05/03/16 0546)    Assessment: 51 yo M s/p cardiac arrest at outside hospital, on IV heparin for elevated troponin/afib. Heparin level is below goal (0.21) on 1100 units/hr.   -No bleeding issues noted overnight -Hgb stable 11   Goal of Therapy:  Heparin level 0.3-0.7 units/ml Monitor platelets by anticoagulation protocol: Yes   Plan:  Heparin increase infusion to 1350 units/hr Heparin level in 6 hours and daily wth CBC daily Daily heparin level, CBC Planning cath  later this week - will consider oral anticoagulation after procedures are completed  Erin Hearing PharmD., BCPS Clinical Pharmacist Pager [phone] 05/03/2016 2:40 PM

## 2016-05-03 NOTE — Progress Notes (Signed)
Patient Name: David [name] Date of Encounter: 05/03/2016  Primary Cardiologist: Dr Geraldo Pitter [facility] Problem List     Principal Problem:   Acute on chronic systolic and diastolic heart failure, NYHA class 4 (HCC) Active Problems:   CAD (coronary artery disease), native coronary artery   Hyperlipidemia   Acute on chronic combined systolic and diastolic CHF (congestive heart failure) (HCC)   PAF (paroxysmal atrial fibrillation) (HCC)     Subjective   Pt is pleasant and denies substernal chest pain or shortness of breath. He does have soreness in the ribs presumably due to CPR. Breathing is much better than on admission, but still not at baseline. He has been up to bathe and up in chair this morning and says "It felt great".  Inpatient Medications    Scheduled Meds: . aspirin EC  81 mg Oral Daily  . chlorhexidine gluconate (MEDLINE KIT)  15 mL Mouth Rinse BID  . Chlorhexidine Gluconate Cloth  6 each Topical Q0600  . digoxin  0.125 mg Oral Daily  . ferumoxytol  510 mg Intravenous Weekly  . furosemide  80 mg Intravenous [id number]  . Influenza vac split quadrivalent PF  0.5 mL Intramuscular Tomorrow-1000  . isosorbide-hydrALAZINE  1 tablet Oral TID  . mupirocin ointment  1 application Nasal BID  . rosuvastatin  10 mg Oral q1800  . sodium chloride flush  10-40 mL Intracatheter Q12H  . spironolactone  25 mg Oral Daily  . traZODone  100 mg Oral QHS   Continuous Infusions: . DOBUTamine 2.5 mcg/kg/min (05/03/16 1009)  . heparin 1,100 Units/hr (05/03/16 0546)   PRN Meds: sodium chloride, acetaminophen, ondansetron (ZOFRAN) IV, sodium chloride flush, traMADol   Vital Signs    Vitals:   05/03/16 0800 05/03/16 0819 05/03/16 0900 05/03/16 1000  BP: (!) 113/95  120/90 99/65  Pulse:      Resp: (!) 21  (!) 25 (!) 22  Temp:  97.8 F (36.6 C)    TempSrc:  Oral    SpO2: 94%     Weight:      Height:        Intake/Output Summary (Last 24 hours) at 05/03/16  1014 Last data filed at 05/03/16 1009  Gross per 24 hour  Intake          1350.25 ml  Output             6500 ml  Net         -5149.75 ml   Filed Weights   05/01/16 0500 05/02/16 0432 05/03/16 0500  Weight: 167 lb 11.2 oz (76.1 kg) 154 lb 5.2 oz (70 kg) 160 lb 6.4 oz (72.8 kg)    Physical Exam    GEN: Pleasant AA male, in no acute distress.  HEENT: Grossly normal.  Neck: Supple, no JVD, carotid bruits, or masses. Cardiac: RRR, no murmurs, rubs, or gallops. No clubbing, cyanosis, edema.  Radials/DP/PT 2+ and equal bilaterally.  Respiratory:  Respirations regular and unlabored, clear to auscultation bilaterally. GI: Soft, nontender, nondistended, BS + x 4. MS: no deformity or atrophy. Skin: warm and dry, no rash. Neuro:  Strength and sensation are intact. Psych: AAOx3.  Normal affect.  Labs    CBC  Recent Labs  04/30/16 1950  05/02/16 0500 05/03/16 0435  WBC 18.7*  < > 10.4 11.0*  NEUTROABS 16.2*  --   --   --   HGB 12.4*  < > 10.5* 11.1*  HCT 37.6*  < > 32.5* 33.1*  MCV 81.9  < > 83.5 81.3  PLT 253  < > 189 231  < > = values in this interval not displayed. Basic Metabolic Panel  Recent Labs  04/30/16 1950  05/01/16 1420 05/02/16 0500 05/03/16 0435  NA 138  < >  --  137 138  K 4.0  < >  --  3.2* 3.8  CL 105  < >  --  103 104  CO2 21*  < >  --  25 25  GLUCOSE 106*  < >  --  82 96  BUN 21*  < >  --  17 12  CREATININE 1.67*  < >  --  1.33* 1.25*  CALCIUM 8.0*  < >  --  8.3* 8.8*  MG 2.1  --  1.7  --   --   PHOS 4.3  --   --   --   --   < > = values in this interval not displayed. Liver Function Tests  Recent Labs  04/30/16 1950  AST 94*  ALT 149*  ALKPHOS 132*  BILITOT 1.0  PROT 6.4*  ALBUMIN 2.9*   No results for input(s): LIPASE, AMYLASE in the last 72 hours. Cardiac Enzymes  Recent Labs  04/30/16 1950 05/01/16 0150 05/01/16 0444  TROPONINI 2.01* 1.89* 1.62*   BNP Invalid input(s): POCBNP D-Dimer No results for input(s): DDIMER in the  last 72 hours. Hemoglobin A1C No results for input(s): HGBA1C in the last 72 hours. Fasting Lipid Panel No results for input(s): CHOL, HDL, LDLCALC, TRIG, CHOLHDL, LDLDIRECT in the last 72 hours. Thyroid Function Tests No results for input(s): TSH, T4TOTAL, T3FREE, THYROIDAB in the last 72 hours.  Invalid input(s): FREET3  Telemetry    Sinus tachycardia with rates in the low 100's-110's. Occ PVC's and rare short runs of NSVT- Personally Reviewed  ECG    No new tracings  Radiology    No results found.  Cardiac Studies   05/02/2016- ECHO Study Conclusions - Left ventricle: LVEF is severely depressed at 15 to 20% with   diffuse hypokinesis; inferior and posterior akinesis. The cavity   size was moderately dilated. Wall thickness was normal. - Aortic valve: There was trivial regurgitation. - Mitral valve: There was moderate regurgitation. - Left atrium: The atrium was moderately dilated. - Right ventricle: The cavity size was mildly dilated. - Right atrium: The atrium was mildly dilated. ________________________________________________________________________  02/2011 cardiac cath- Mid RCA BMS. (Full report can be found under media tab)  Patient Profile     51 yo male patient with past medical history of CAD with MI X3 and multiple stents, ischemic cardiomyopathy who presented to [facility] for 2 week history of worsening heart failure symptoms. He was in AF with RVR and was treated with amio, but developed bradycardia and cardiac arrest. He was resuscitated and takent to [facility]. He was stabilized and is being treated for heart failurea nd atrial fib. Heart failure team has been consulted.  Assessment & Plan    1. Chronic systolic heart failure -History of multiple MI's from 2010-2013 and multiple stents. And subsequent Ischemic cardiomyopathy. -Presented to [facility] on 04/29/16 with 2 week history of worsening heart failure symptoms. He developed symptomatic  tachycardia and received rapid amiodarone leading to bradycardia and loss of pulse. He was transferred to [facility]. He was intubated from 04/30/16 to 05/01/16.  Vasopressors were used. Dobutamine is being weaned slowly per heart failure team.  -He was admitted to [facility] by  the cardiology fellow and the heart failure is following -He has been diuresed with lasix 80 mg IV TID and aldactone -I&O is -4116 for previous day and -5308 since admission, 2200 ml UOP just this morning -Weight down 167 lb 14 oz-->167 lb 11 oz-->154 lb 5 oz-->160 -Heart failure team is considering ICD -Current echo results EF 15 - 20%.  -BNP 05/03/16 1034 -Plan per heart failure team is to wean dobutamine and possibly discontinue tomorrow followed by cardiac cath on Thursday or Friday.  -ICD consideration per HF team  2. Atrial fibrillation -On admit symptomatic tachycardia treated with rapid dose of amiodarone leading to PEA arrest. Now sinus tachycardia with PVC's and occ 3-4 beat runs of NSVT -On digoxin 0.125 mg daily -No beta blocker at present -Anticoagulated with heparin infusion -CHA2DS2-VASc score 3 (CHF, HTN,  Vascular disease), will need oral anticoagulant, DOAC, once all invasive procedures completed  3. Acute respiratory failure -In setting of heart failure exacerbation, rapid atrial fib and PEA arrest  -Now breathing better  4. CAD -Hx of at least 3 MI's and multiple stents between 2010 and 2013 -Seen at [facility] in [place] but wants to transition here -Troponins this admission 2.01, 1.89, 1.62- likely related to heart failure and S/P cardiac arrest with CPR -Continue aspirin and statin -Bidil has just been started at low dose by heart failure  5. CKD stage III -Diuresing with lasix and spironolactone -Creatinine improving  1.33 (1/8), 1.25 (1/9), was 1.67 on 1/6 -K+ 3.2 1/8, 3.8 (1/9)   6. Anemia -Watching Hgb -Feraheme being administered per heart failure team -FOBT ordered  7.  Tobacco use -Advise smoking cessation  Signed, Daune Perch, NP  05/03/2016, 10:14 AM

## 2016-05-03 NOTE — Progress Notes (Signed)
Advanced Heart Failure Rounding Note  Cardiologist: Dr. Geraldo Pitter ([place])  Subjective:    He presented to [facility] on 1/5 after ~2 weeks of worsening HF symptoms.  In OSH Ed, he developed symptomatic tachycardia (AFib/AFl) and received rapid IV amiodarone bolus leading to bradycardia and loss of pulse. Required 1-2 rounds of CPR and intubation. He was started on Dopa+Dobutamine. Transferred to [facility] and transitioned from Dopamine to Levophed.  Extubated 05/01/2016.   ECHO 05/03/2015: EF 15-20%. RV mildly dilated.   Remains on dobutamine at 5 mcg. Yesterday diuresed with IV lasix, spiro increased, and bidil added. Negative 4 liters.   Todays CO-OX is 59%.  Denies SOB/CP.    Objective:   Weight Range:  Vital Signs:   Temp:  [97.9 F (36.6 C)-98.4 F (36.9 C)] 98.2 F (36.8 C) (01/09 0300) Pulse Rate:  [58-129] 122 (01/09 0500) Resp:  [14-35] 30 (01/09 0600) BP: (91-136)/(64-91) 136/66 (01/09 0600) SpO2:  [91 %-99 %] 95 % (01/09 0500) Arterial Line BP: (122-142)/(59-66) 142/59 (01/08 1000) Weight:  [160 lb 6.4 oz (72.8 kg)] 160 lb 6.4 oz (72.8 kg) (01/09 0500) Last BM Date:  (UTA)  Weight change: Filed Weights   05/01/16 0500 05/02/16 0432 05/03/16 0500  Weight: 167 lb 11.2 oz (76.1 kg) 154 lb 5.2 oz (70 kg) 160 lb 6.4 oz (72.8 kg)    Intake/Output:   Intake/Output Summary (Last 24 hours) at 05/03/16 0727 Last data filed at 05/03/16 0700  Gross per 24 hour  Intake          1383.65 ml  Output             5500 ml  Net         -4116.35 ml     Physical Exam: CVP 12-13 General:  Lying in bed. No resp difficulty HEENT: normal Neck: supple. RIJ TLC. JVP 11-12 . Carotids 2+ bilat; no bruits. No lymphadenopathy or thryomegaly appreciated. Cor: PMI laterally displaced. Regular/tachy. No s3 Lungs: clear Abdomen: soft, nontender, nondistended. No hepatosplenomegaly. No bruits or masses. Good bowel sounds. Extremities: no cyanosis, clubbing, rash,  edema Neuro: alert & orientedx3, cranial nerves grossly intact. moves all 4 extremities w/o difficulty. Affect pleasant  Telemetry: ST 100s  Labs: Basic Metabolic Panel:  Recent Labs Lab 04/30/16 1950 05/01/16 0444 05/01/16 1420 05/02/16 0500 05/03/16 0435  NA 138 142  --  137 138  K 4.0 4.1  --  3.2* 3.8  CL 105 107  --  103 104  CO2 21* 25  --  25 25  GLUCOSE 106* 103*  --  82 96  BUN 21* 22*  --  17 12  CREATININE 1.67* 1.62*  --  1.33* 1.25*  CALCIUM 8.0* 8.2*  --  8.3* 8.8*  MG 2.1  --  1.7  --   --   PHOS 4.3  --   --   --   --     Liver Function Tests:  Recent Labs Lab 04/30/16 1950  AST 94*  ALT 149*  ALKPHOS 132*  BILITOT 1.0  PROT 6.4*  ALBUMIN 2.9*   No results for input(s): LIPASE, AMYLASE in the last 168 hours. No results for input(s): AMMONIA in the last 168 hours.  CBC:  Recent Labs Lab 04/30/16 1950 05/01/16 1625 05/02/16 0500 05/03/16 0435  WBC 18.7* 10.8* 10.4 11.0*  NEUTROABS 16.2*  --   --   --   HGB 12.4* 9.3* 10.5* 11.1*  HCT 37.6* 28.5* 32.5* 33.1*  MCV 81.9 83.1 83.5 81.3  PLT 253 174 189 231    Cardiac Enzymes:  Recent Labs Lab 04/30/16 1950 05/01/16 0150 05/01/16 0444  TROPONINI 2.01* 1.89* 1.62*    BNP: BNP (last 3 results)  Recent Labs  05/03/16 0435  BNP 1,034.8*    ProBNP (last 3 results) No results for input(s): PROBNP in the last 8760 hours.    Other results:  Imaging: No results found.   Medications:     Scheduled Medications: . aspirin EC  81 mg Oral Daily  . chlorhexidine gluconate (MEDLINE KIT)  15 mL Mouth Rinse BID  . Chlorhexidine Gluconate Cloth  6 each Topical Q0600  . digoxin  0.125 mg Oral Daily  . ferumoxytol  510 mg Intravenous Weekly  . furosemide  80 mg Intravenous [id number]  . Influenza vac split quadrivalent PF  0.5 mL Intramuscular Tomorrow-1000  . isosorbide-hydrALAZINE  0.5 tablet Oral TID  . mupirocin ointment  1 application Nasal BID  . rosuvastatin  10 mg Oral  q1800  . sodium chloride flush  10-40 mL Intracatheter Q12H  . spironolactone  25 mg Oral Daily  . traZODone  100 mg Oral QHS    Infusions: . DOBUTamine 5 mcg/kg/min (05/02/16 0957)  . heparin 1,100 Units/hr (05/03/16 0546)    PRN Medications: sodium chloride, acetaminophen, ondansetron (ZOFRAN) IV, sodium chloride flush   Assessment:   1. Cardiogenic shock 2. PEA arrest 3. Acute on chronic systolic HF   --EF < 90% due to iCM 4. Acute hypoxemic respiratory failure 5. Elevated troponin 6. PAF 7. CAD    --s/p ('09 PCI to LAD, mRCA, '10 PCI to Lcx and '12 mRCA),  8. CKD, stage III 9. Tobacco use    -says he is smoking 1/2 cigarette per day 10. Anemia  11. Hypokalemia-   Plan/Discussion:    ECHO EF 15-20% RV mildly dilated. Todays CO-OX is 59%. Cut back dobutamine to 4 mcg.  No BB for now. Continue spiro 25 mg daily. Renal function ok. Plan RHC/LHC once fully diuresed.  No Ace/ARB/ARNI for now. Increase bidil to 1 tab bidil three times daily. Also consider ICD in near future.   By history it seems like he has had worsening HF for 2-3 weeks without angina. Suspect PAF may have put him over the edge. Developed PEA with amio bolus. Now back in NSR. Will continue heparin and will need DOAC on d/c.   Iron stores low- received  Feraheme 05/02/2016. Hgb stable -->11.1. FOBT.   OOB today.   Anticipate RHC/LHC by the end of the week.   Length of Stay: 3   Amy Clegg NP-C  05/03/2016, 7:27 AM  Advanced Heart Failure Team Pager [phone] (M-F; 7a - 4p)  Please contact [facility] for night-coverage after hours (4p -7a ) and weekends on [email or web address]  Patient seen and examined with Darrick Grinder, NP. We discussed all aspects of the encounter. I agree with the assessment and plan as stated above.    He continues to improve. Diuresing well. CVP down to 15. Co-ox ok. Will cut dobutamine back. Continue IV diuresis. Remains tachycardic. Will continue IV diuresis. Increase Bidil.  Once fully  diuresed plan R/L heart cath. Continue heparin. Consider ivabradine.   Amalia Edgecombe,MD 2:16 PM

## 2016-05-03 NOTE — Progress Notes (Signed)
[place] for Heparin  Indication: chest pain/ACS, s/p cardiac arrest  Allergies  Allergen Reactions  . Plavix [Clopidogrel Bisulfate] Hives   Patient Measurements: Height: 5\' 5"  (165.1 cm) Weight: 154 lb 5.2 oz (70 kg) IBW/kg (Calculated) : 61.5  Vital Signs: Temp: 98.2 F (36.8 C) (01/09 0300) Temp Source: Oral (01/09 0300) BP: 117/79 (01/09 0300) Pulse Rate: 107 (01/09 0300)  Labs:  Recent Labs  04/30/16 1950 05/01/16 0150 05/01/16 0444  05/01/16 1625 05/01/16 2324 05/02/16 0500 05/03/16 0435  HGB 12.4*  --   --   --  9.3*  --  10.5* 11.1*  HCT 37.6*  --   --   --  28.5*  --  32.5* 33.1*  PLT 253  --   --   --  174  --  189 231  LABPROT 17.7*  --   --   --   --   --   --   --   INR 1.44  --   --   --   --   --   --   --   HEPARINUNFRC  --   --   --   < >  --  0.35 0.37 0.21*  CREATININE 1.67*  --  1.62*  --   --   --  1.33* 1.25*  TROPONINI 2.01* 1.89* 1.62*  --   --   --   --   --   < > = values in this interval not displayed.   Medical History: Past Medical History:  Diagnosis Date  . CAD (coronary artery disease) 2009   AMI in 12/2007 with PCI to LAD, staged PCI to  mid/distal RCA, NSTEMI in 02/2009 with BMS to LCx  . HLD (hyperlipidemia)   . HTN (hypertension)   . Ischemic cardiomyopathy    Admitted in 07/2010 with CHF exacerbation   . DOBUTamine 5 mcg/kg/min (05/02/16 0957)  . heparin 950 Units/hr (05/03/16 0141)    Assessment: 51 yo M s/p cardiac arrest at outside hospital, on IV heparin for elevated troponin/afib. Heparin level is below goal (0.21) on 950 units/hr   Goal of Therapy:  Heparin level 0.3-0.7 units/ml Monitor platelets by anticoagulation protocol: Yes   Plan:  Heparin bolus 1000 units and increase infusion to 1100 units/hr Heparin level in 6 hours and daily wth CBC daily Daily heparin level, CBC   Hildred Laser, Pharm D 05/03/2016 5:04 AM

## 2016-05-03 NOTE — Care Management Note (Addendum)
Case Management Note  Patient Details  Name: David [name] MRN: [id number] Date of Birth: 09/29/1965  Subjective/Objective:    Adm w chf              Action/Plan: to go home w wife at disch   Expected Discharge Date:                  Expected Discharge Plan:  [place]  In-House Referral:     Discharge planning Services  CM Consult, Medication Assistance  Post Acute Care Choice:    Choice offered to:     DME Arranged:    DME Agency:     HH Arranged:    HH Agency:     Status of Service:  In process, will continue to follow  If discussed at Long Length of Stay Meetings, dates discussed:    Additional Comments:gave pt 30day free eliquis card. Has [employer] medic[employer] ins for meds./W  JUANITA @ [employer] RX # [phone]   ELIQUIS 5 MG BID 30 / 60 TAB   COVER- YES  CO-PAY- $ 242.00   ( DEDUCTIBLE NOT MET )  TIER- 3 DRUG  PRIOR APPROVAL- NO  PHARMACY : [facility], [facility] AND [place] Gell, RN 05/03/2016, 10:21 AM

## 2016-05-04 LAB — COOXEMETRY PANEL
CARBOXYHEMOGLOBIN: 1.6 % — AB (ref 0.5–1.5)
Methemoglobin: 1.1 % (ref 0.0–1.5)
O2 SAT: 53.2 %
TOTAL HEMOGLOBIN: 12.5 g/dL (ref 12.0–16.0)

## 2016-05-04 LAB — BASIC METABOLIC PANEL
ANION GAP: 8 (ref 5–15)
BUN: 10 mg/dL (ref 6–20)
CO2: 28 mmol/L (ref 22–32)
CREATININE: 1.24 mg/dL (ref 0.61–1.24)
Calcium: 9.4 mg/dL (ref 8.9–10.3)
Chloride: 100 mmol/L — ABNORMAL LOW (ref 101–111)
GFR calc non Af Amer: >60 mL/min (ref >60)
Glucose, Bld: 93 mg/dL (ref 65–99)
Potassium: 3.3 mmol/L — ABNORMAL LOW (ref 3.5–5.1)
Sodium: 136 mmol/L (ref 135–145)

## 2016-05-04 LAB — CBC
HCT: 36.2 % — ABNORMAL LOW (ref 39.0–52.0)
Hemoglobin: 12.1 g/dL — ABNORMAL LOW (ref 13.0–17.0)
MCH: 27.2 pg (ref 26.0–34.0)
MCHC: 33.4 g/dL (ref 30.0–36.0)
MCV: 81.3 fL (ref 78.0–100.0)
PLATELETS: 231 10*3/uL (ref 150–400)
RBC: 4.45 MIL/uL (ref 4.22–5.81)
RDW: 16 % — AB (ref 11.5–15.5)
WBC: 10.9 10*3/uL — AB (ref 4.0–10.5)

## 2016-05-04 LAB — URIC ACID: Uric Acid, Serum: 13.7 mg/dL — ABNORMAL HIGH (ref 4.4–7.6)

## 2016-05-04 LAB — HEPARIN LEVEL (UNFRACTIONATED): Heparin Unfractionated: 0.5 IU/mL (ref 0.30–0.70)

## 2016-05-04 MED ORDER — POTASSIUM CHLORIDE CRYS ER 20 MEQ PO TBCR
40.0000 meq | EXTENDED_RELEASE_TABLET | Freq: Three times a day (TID) | ORAL | Status: AC
  Administered 2016-05-04 (×3): 40 meq via ORAL
  Filled 2016-05-04 (×3): qty 2

## 2016-05-04 MED ORDER — ASPIRIN 81 MG PO CHEW
81.0000 mg | CHEWABLE_TABLET | ORAL | Status: AC
  Administered 2016-05-05: 81 mg via ORAL
  Filled 2016-05-04: qty 1

## 2016-05-04 MED ORDER — SODIUM CHLORIDE 0.9 % IV SOLN
INTRAVENOUS | Status: DC
  Administered 2016-05-05: via INTRAVENOUS

## 2016-05-04 MED ORDER — ASPIRIN EC 81 MG PO TBEC
81.0000 mg | DELAYED_RELEASE_TABLET | Freq: Every day | ORAL | Status: DC
  Administered 2016-05-06 – 2016-05-08 (×3): 81 mg via ORAL
  Filled 2016-05-04 (×3): qty 1

## 2016-05-04 MED ORDER — FUROSEMIDE 10 MG/ML IJ SOLN
80.0000 mg | Freq: Once | INTRAMUSCULAR | Status: AC
  Administered 2016-05-04: 80 mg via INTRAVENOUS
  Filled 2016-05-04: qty 8

## 2016-05-04 MED ORDER — ISOSORB DINITRATE-HYDRALAZINE 20-37.5 MG PO TABS
0.5000 | ORAL_TABLET | Freq: Three times a day (TID) | ORAL | Status: DC
  Administered 2016-05-04 – 2016-05-13 (×26): 0.5 via ORAL
  Filled 2016-05-04 (×26): qty 1

## 2016-05-04 MED ORDER — SODIUM CHLORIDE 0.9% FLUSH
3.0000 mL | Freq: Two times a day (BID) | INTRAVENOUS | Status: DC
  Administered 2016-05-04: 3 mL via INTRAVENOUS

## 2016-05-04 MED ORDER — INFLUENZA VAC SPLIT QUAD 0.5 ML IM SUSY: 0.5000 mL | PREFILLED_SYRINGE | INTRAMUSCULAR | Status: AC | PRN

## 2016-05-04 MED ORDER — SODIUM CHLORIDE 0.9 % IV SOLN: 250.0000 mL | INTRAVENOUS | Status: DC | PRN

## 2016-05-04 MED ORDER — IVABRADINE HCL 5 MG PO TABS
2.5000 mg | ORAL_TABLET | Freq: Two times a day (BID) | ORAL | Status: DC
  Administered 2016-05-04 – 2016-05-13 (×16): 2.5 mg via ORAL
  Filled 2016-05-04 (×22): qty 1

## 2016-05-04 MED ORDER — SODIUM CHLORIDE 0.9% FLUSH
3.0000 mL | INTRAVENOUS | Status: DC | PRN
Start: 2016-05-04 — End: 2016-05-05

## 2016-05-04 NOTE — Progress Notes (Signed)
[facility] for Heparin  Indication: chest pain/ACS, s/p cardiac arrest  Allergies  Allergen Reactions  . Plavix [Clopidogrel Bisulfate] Hives   Patient Measurements: Height: 5\' 5"  (165.1 cm) Weight: 156 lb 8.4 oz (71 kg) IBW/kg (Calculated) : 61.5  Vital Signs: Temp: 98 F (36.7 C) (01/10 0300) Temp Source: Oral (01/10 0300) BP: 87/60 (01/10 0700) Pulse Rate: 108 (01/09 2000)  Labs:  Recent Labs  05/02/16 0500 05/03/16 0435 05/03/16 1145 05/03/16 2245 05/04/16 0347 05/04/16 0348  HGB 10.5* 11.1*  --   --   --  12.1*  HCT 32.5* 33.1*  --   --   --  36.2*  PLT 189 231  --   --   --  231  HEPARINUNFRC 0.37 0.21* 0.21* 0.40 0.50  --   CREATININE 1.33* 1.25*  --   --   --  1.24     Medical History: Past Medical History:  Diagnosis Date  . CAD (coronary artery disease) 2009   AMI in 12/2007 with PCI to LAD, staged PCI to  mid/distal RCA, NSTEMI in 02/2009 with BMS to LCx  . HLD (hyperlipidemia)   . HTN (hypertension)   . Ischemic cardiomyopathy    Admitted in 07/2010 with CHF exacerbation   . DOBUTamine 2.5 mcg/kg/min (05/03/16 1100)  . heparin 1,350 Units/hr (05/03/16 2238)    Assessment: 51 yo M s/p cardiac arrest at outside hospital, on IV heparin for elevated troponin/afib. Heparin level is at goal (0.5) on 1350 units/hr.   -No bleeding issues noted overnight -Hgb stable 12   Goal of Therapy:  Heparin level 0.3-0.7 units/ml Monitor platelets by anticoagulation protocol: Yes   Plan:  Heparin infusion continues at 1350 units/hr Daily heparin level, CBC Planning cath later this week - will consider oral anticoagulation after procedures are completed  Erin Hearing PharmD., BCPS Clinical Pharmacist Pager [phone] 05/04/2016 7:41 AM

## 2016-05-04 NOTE — Care Management Important Message (Signed)
Important Message  Patient Details  Name: David [name] MRN: [id number] Date of Birth: [birth year]   Medicare Important Message Given:  Yes    Lacretia Leigh, RN 05/04/2016, 10:20 AM

## 2016-05-04 NOTE — Progress Notes (Signed)
[facility] for Heparin  Indication: chest pain/ACS, s/p cardiac arrest  Allergies  Allergen Reactions  . Plavix [Clopidogrel Bisulfate] Hives   Patient Measurements: Height: 5\' 5"  (165.1 cm) Weight: 160 lb 6.4 oz (72.8 kg) IBW/kg (Calculated) : 61.5  Vital Signs: Temp: 97.8 F (36.6 C) (01/09 2000) Temp Source: Oral (01/09 2000) BP: 84/63 (01/10 0000) Pulse Rate: 108 (01/09 2000)  Labs:  Recent Labs  05/01/16 0150 05/01/16 0444  05/01/16 1625  05/02/16 0500 05/03/16 0435 05/03/16 1145 05/03/16 2245  HGB  --   --   < > 9.3*  --  10.5* 11.1*  --   --   HCT  --   --   --  28.5*  --  32.5* 33.1*  --   --   PLT  --   --   --  174  --  189 231  --   --   HEPARINUNFRC  --   --   < >  --   < > 0.37 0.21* 0.21* 0.40  CREATININE  --  1.62*  --   --   --  1.33* 1.25*  --   --   TROPONINI 1.89* 1.62*  --   --   --   --   --   --   --   < > = values in this interval not displayed.   Medical History: Past Medical History:  Diagnosis Date  . CAD (coronary artery disease) 2009   AMI in 12/2007 with PCI to LAD, staged PCI to  mid/distal RCA, NSTEMI in 02/2009 with BMS to LCx  . HLD (hyperlipidemia)   . HTN (hypertension)   . Ischemic cardiomyopathy    Admitted in 07/2010 with CHF exacerbation   . DOBUTamine 2.5 mcg/kg/min (05/03/16 1100)  . heparin 1,350 Units/hr (05/03/16 2238)    Assessment: 51 yo M s/p cardiac arrest at outside hospital, on IV heparin for elevated troponin/afib. Heparin level is at goal (0.21) on 1350 units/hr.   Goal of Therapy:  Heparin level 0.3-0.7 units/ml Monitor platelets by anticoagulation protocol: Yes   Plan:  No heparin changes needed Daily heparin level, CBC  Hildred Laser, Pharm D 05/04/2016 12:46 AM

## 2016-05-04 NOTE — Progress Notes (Signed)
Physical Therapy Discharge Patient Details Name: David [name] MRN: [id number] DOB: Dec 04, 1965 Today's Date: 05/04/2016 Time: 3735-7897 PT Time Calculation (min) (ACUTE ONLY): 13 min  Patient discharged from PT services secondary to goals met and no further PT needs identified.  Please see latest therapy progress note for current level of functioning and progress toward goals.    Progress and discharge plan discussed with patient and/or caregiver: Patient/Caregiver agrees with plan  GP     [facility], [place], DPT [phone]  05/04/2016, 4:50 PM

## 2016-05-04 NOTE — Progress Notes (Signed)
Advanced Heart Failure Rounding Note  Cardiologist: Dr. Geraldo Pitter (Happy)  Subjective:    He presented to [facility] on 1/5 after ~2 weeks of worsening HF symptoms.  In OSH Ed, he developed symptomatic tachycardia (AFib/AFl) and received rapid IV amiodarone bolus leading to bradycardia and loss of pulse. Required 1-2 rounds of CPR and intubation. He was started on Dopa+Dobutamine. Transferred to [facility] and transitioned from Dopamine to Levophed.  Extubated 05/01/2016.   ECHO 05/03/2015: EF 15-20%. RV mildly dilated.   Remains on dobutamine at  2.5 mcg. Yesterday diuresed with IV lasix and bidil was increased. Negative 3.9 liters.   Todays CO-OX is 53% on 2.5 mcg dobutamine.   Denies SOB/CP.    Objective:   Weight Range:  Vital Signs:   Temp:  [97.6 F (36.4 C)-98.9 F (37.2 C)] 98 F (36.7 C) (01/10 0300) Pulse Rate:  [108] 108 (01/09 2000) Resp:  [13-28] 20 (01/10 0700) BP: (78-120)/(54-95) 87/60 (01/10 0700) SpO2:  [94 %-96 %] 96 % (01/10 0300) Weight:  [156 lb 8.4 oz (71 kg)] 156 lb 8.4 oz (71 kg) (01/10 0700) Last BM Date:  (UTA)  Weight change: Filed Weights   05/02/16 0432 05/03/16 0500 05/04/16 0700  Weight: 154 lb 5.2 oz (70 kg) 160 lb 6.4 oz (72.8 kg) 156 lb 8.4 oz (71 kg)    Intake/Output:   Intake/Output Summary (Last 24 hours) at 05/04/16 0739 Last data filed at 05/04/16 0700  Gross per 24 hour  Intake           1156.6 ml  Output             5075 ml  Net          -3918.4 ml     Physical Exam: CVP 7-8  General:  Lying in bed. No resp difficulty HEENT: normal Neck: supple. RIJ TLC. JVP 7-8 . Carotids 2+ bilat; no bruits. No lymphadenopathy or thryomegaly appreciated. Cor: PMI laterally displaced. Regular/tachy. No s3 Lungs: clear Abdomen: soft, nontender, nondistended. No hepatosplenomegaly. No bruits or masses. Good bowel sounds. Extremities: no cyanosis, clubbing, rash, edema Neuro: alert & orientedx3, cranial nerves grossly  intact. moves all 4 extremities w/o difficulty. Affect pleasant  Telemetry: ST 100 with PVCs.  Labs: Basic Metabolic Panel:  Recent Labs Lab 04/30/16 1950 05/01/16 0444 05/01/16 1420 05/02/16 0500 05/03/16 0435 05/04/16 0348  NA 138 142  --  137 138 136  K 4.0 4.1  --  3.2* 3.8 3.3*  CL 105 107  --  103 104 100*  CO2 21* 25  --  '25 25 28  '$ GLUCOSE 106* 103*  --  82 96 93  BUN 21* 22*  --  '17 12 10  '$ CREATININE 1.67* 1.62*  --  1.33* 1.25* 1.24  CALCIUM 8.0* 8.2*  --  8.3* 8.8* 9.4  MG 2.1  --  1.7  --   --   --   PHOS 4.3  --   --   --   --   --     Liver Function Tests:  Recent Labs Lab 04/30/16 1950  AST 94*  ALT 149*  ALKPHOS 132*  BILITOT 1.0  PROT 6.4*  ALBUMIN 2.9*   No results for input(s): LIPASE, AMYLASE in the last 168 hours. No results for input(s): AMMONIA in the last 168 hours.  CBC:  Recent Labs Lab 04/30/16 1950 05/01/16 1625 05/02/16 0500 05/03/16 0435 05/04/16 0348  WBC 18.7* 10.8* 10.4 11.0* 10.9*  NEUTROABS 16.2*  --   --   --   --  HGB 12.4* 9.3* 10.5* 11.1* 12.1*  HCT 37.6* 28.5* 32.5* 33.1* 36.2*  MCV 81.9 83.1 83.5 81.3 81.3  PLT 253 174 189 231 231    Cardiac Enzymes:  Recent Labs Lab 04/30/16 1950 05/01/16 0150 05/01/16 0444  TROPONINI 2.01* 1.89* 1.62*    BNP: BNP (last 3 results)  Recent Labs  05/03/16 0435  BNP 1,034.8*    ProBNP (last 3 results) No results for input(s): PROBNP in the last 8760 hours.    Other results:  Imaging: No results found.   Medications:     Scheduled Medications: . aspirin EC  81 mg Oral Daily  . chlorhexidine gluconate (MEDLINE KIT)  15 mL Mouth Rinse BID  . Chlorhexidine Gluconate Cloth  6 each Topical Q0600  . digoxin  0.125 mg Oral Daily  . ferumoxytol  510 mg Intravenous Weekly  . furosemide  80 mg Intravenous [id number]  . Influenza vac split quadrivalent PF  0.5 mL Intramuscular Tomorrow-1000  . isosorbide-hydrALAZINE  1 tablet Oral TID  . mupirocin ointment   1 application Nasal BID  . rosuvastatin  10 mg Oral q1800  . sodium chloride flush  10-40 mL Intracatheter Q12H  . spironolactone  25 mg Oral Daily  . traZODone  100 mg Oral QHS    Infusions: . DOBUTamine 2.5 mcg/kg/min (05/03/16 1100)  . heparin 1,350 Units/hr (05/03/16 2238)    PRN Medications: sodium chloride, acetaminophen, ondansetron (ZOFRAN) IV, sodium chloride flush, traMADol   Assessment:   1. Cardiogenic shock 2. PEA arrest 3. Acute on chronic systolic HF   --EF < 35% due to iCM 4. Acute hypoxemic respiratory failure 5. Elevated troponin 6. PAF 7. CAD    --s/p ('09 PCI to LAD, mRCA, '10 PCI to Lcx and '12 mRCA),  8. CKD, stage III 9. Tobacco use    -says he is smoking 1/2 cigarette per day 10. Anemia  11. Hypokalemia-   Plan/Discussion:    ECHO EF 15-20% RV mildly dilated. Todays CO-OX is 53% on dobutamine 2.5 mcg. Plan to turn off dobutamine at 2300. Aim to cath off dobutamine. Volume status improved. CVP down to 7-8. Give one more dose of IV lasix. Adjust diuretics tomorrow.   No BB for now. Continue spiro 25 mg daily. Renal function ok. Plan RHC/LHC tomorrow.  No Ace/ARB/ARNI for now. Decrease bidil to 1/2 tab bidil three times daily. Had doses held yesterday due to hypotension. Also consider ICD in near future.   By history it seems like he has had worsening HF for 2-3 weeks without angina. Suspect PAF may have put him over the edge. Developed PEA with amio bolus. Now back in NSR. Will continue heparin and will need DOAC on d/c.   Iron stores low- received  Feraheme 05/02/2016. Hgb stable -->12. FOBT.   OOB today. Consult cardiac rehab.   Length of Stay: 4   Amy Clegg NP-C  05/04/2016, 7:39 AM  Advanced Heart Failure Team Pager [phone] (M-F; 7a - 4p)  Please contact [facility] for night-coverage after hours (4p -7a ) and weekends on [email or web address]  Patient seen and examined with Darrick Grinder, NP. We discussed all aspects of the encounter. I agree with the  assessment and plan as stated above.   Volume status much improved CVP 7-8. Co-ox marginal on dobutamine. Will continue to wean. Remains tachycardic. Will add low dose ivabradine. BP low. Will cut back bidil. Discussed L/R heart cath tomorrow. Supp K+. Consult CR.   Bensimhon, Daniel,MD 3:31 PM

## 2016-05-04 NOTE — Progress Notes (Signed)
Physical Therapy Treatment Patient Details Name: David [name] MRN: [id number] DOB: August 15, 1965 Today's Date: 05/04/2016    History of Present Illness Pt transferred from [facility] with heart failure and sufferred cardiac arrest. Intubated prior to transfer and extubated 1/7.  PMH - cardiomyopathy, htn, CAD    PT Comments    Pt presented supine in bed with HOB elevated, awake and willing to participate in therapy session. Pt making excellent progress and is now at supervision level for all functional mobility. All VSS throughout. No further acute PT needs identified at this time. Pt is now d/c'd from acute PT services.  Follow Up Recommendations  No PT follow up     Equipment Recommendations  None recommended by PT    Recommendations for Other Services       Precautions / Restrictions Precautions Precautions: None Restrictions Weight Bearing Restrictions: No    Mobility  Bed Mobility Overal bed mobility: Modified Independent             General bed mobility comments: Incr time  Transfers Overall transfer level: Needs assistance Equipment used: None Transfers: Sit to/from Stand Sit to Stand: Supervision         General transfer comment: increased time, supervision for safety  Ambulation/Gait Ambulation/Gait assistance: Supervision Ambulation Distance (Feet): 500 Feet Assistive device: None Gait Pattern/deviations: Step-through pattern;Decreased stride length Gait velocity: WFL Gait velocity interpretation: at or above normal speed for age/gender General Gait Details: pt ambulated on RA with all VSS throughout   Stairs            Wheelchair Mobility    Modified Rankin (Stroke Patients Only)       Balance Overall balance assessment: Needs assistance Sitting-balance support: No upper extremity supported;Feet supported Sitting balance-Leahy Scale: Good     Standing balance support: No upper extremity supported Standing balance-Leahy  Scale: Fair                      Cognition Arousal/Alertness: Awake/alert Behavior During Therapy: WFL for tasks assessed/performed Overall Cognitive Status: Within Functional Limits for tasks assessed                      Exercises      General Comments        Pertinent Vitals/Pain Pain Assessment: No/denies pain    Home Living                      Prior Function            PT Goals (current goals can now be found in the care plan section) Acute Rehab PT Goals Patient Stated Goal: return home PT Goal Formulation: With patient Time For Goal Achievement: 05/09/16 Potential to Achieve Goals: Good Progress towards PT goals: Goals met/education completed, patient discharged from PT    Frequency    Other (Comment) (d/c PT)      PT Plan Frequency needs to be updated;Discharge plan needs to be updated    Co-evaluation             End of Session   Activity Tolerance: Patient tolerated treatment well Patient left: in bed;with call bell/phone within reach     Time: 1520-1533 PT Time Calculation (min) (ACUTE ONLY): 13 min  Charges:  $Gait Training: 8-22 mins                    G Codes:      David [name]  David [name] 05/04/2016, 4:51 PM Sherie Don, [place], DPT [phone]

## 2016-05-05 ENCOUNTER — Encounter (HOSPITAL_COMMUNITY): Payer: Self-pay | Admitting: Internal Medicine

## 2016-05-05 ENCOUNTER — Encounter (HOSPITAL_COMMUNITY)
Admission: AD | Disposition: A | Discharge status: Home / Self Care | Payer: Self-pay | Source: Other Acute Inpatient Hospital | Attending: Internal Medicine

## 2016-05-05 DIAGNOSIS — R570 Cardiogenic shock: Secondary | ICD-10-CM

## 2016-05-05 DIAGNOSIS — I2510 Atherosclerotic heart disease of native coronary artery without angina pectoris: Secondary | ICD-10-CM

## 2016-05-05 HISTORY — PX: CARDIAC CATHETERIZATION: SHX172

## 2016-05-05 LAB — CBC
HCT: 38.6 % — ABNORMAL LOW (ref 39.0–52.0)
HEMOGLOBIN: 12.7 g/dL — AB (ref 13.0–17.0)
MCH: 26.8 pg (ref 26.0–34.0)
MCHC: 32.9 g/dL (ref 30.0–36.0)
MCV: 81.4 fL (ref 78.0–100.0)
PLATELETS: 293 10*3/uL (ref 150–400)
RBC: 4.74 MIL/uL (ref 4.22–5.81)
RDW: 15.7 % — ABNORMAL HIGH (ref 11.5–15.5)
WBC: 10.5 10*3/uL (ref 4.0–10.5)

## 2016-05-05 LAB — POCT I-STAT 3, ART BLOOD GAS (G3+)
Acid-base deficit: 4 mmol/L — ABNORMAL HIGH (ref 0.0–2.0)
BICARBONATE: 19.9 mmol/L — AB (ref 20.0–28.0)
O2 Saturation: 96 %
PH ART: 7.419 (ref 7.350–7.450)
TCO2: 21 mmol/L (ref 0–100)
pCO2 arterial: 30.7 mmHg — ABNORMAL LOW (ref 32.0–48.0)
pO2, Arterial: 76 mmHg — ABNORMAL LOW (ref 83.0–108.0)

## 2016-05-05 LAB — COOXEMETRY PANEL
CARBOXYHEMOGLOBIN: 1.3 % (ref 0.5–1.5)
METHEMOGLOBIN: 0.9 % (ref 0.0–1.5)
O2 Saturation: 60.4 %
Total hemoglobin: 13.8 g/dL (ref 12.0–16.0)

## 2016-05-05 LAB — POCT I-STAT 3, VENOUS BLOOD GAS (G3P V)
ACID-BASE DEFICIT: 5 mmol/L — AB (ref 0.0–2.0)
Acid-base deficit: 4 mmol/L — ABNORMAL HIGH (ref 0.0–2.0)
Bicarbonate: 20.1 mmol/L (ref 20.0–28.0)
Bicarbonate: 20.8 mmol/L (ref 20.0–28.0)
O2 SAT: 60 %
O2 Saturation: 61 %
PCO2 VEN: 35.6 mmHg — AB (ref 44.0–60.0)
PCO2 VEN: 35.9 mmHg — AB (ref 44.0–60.0)
TCO2: 21 mmol/L (ref 0–100)
TCO2: 22 mmol/L (ref 0–100)
pH, Ven: 7.356 (ref 7.250–7.430)
pH, Ven: 7.376 (ref 7.250–7.430)
pO2, Ven: 32 mmHg (ref 32.0–45.0)
pO2, Ven: 32 mmHg (ref 32.0–45.0)

## 2016-05-05 LAB — BASIC METABOLIC PANEL
Anion gap: 9 (ref 5–15)
BUN: 10 mg/dL (ref 6–20)
CHLORIDE: 105 mmol/L (ref 101–111)
CO2: 23 mmol/L (ref 22–32)
Calcium: 9.7 mg/dL (ref 8.9–10.3)
Creatinine, Ser: 1.18 mg/dL (ref 0.61–1.24)
Glucose, Bld: 93 mg/dL (ref 65–99)
POTASSIUM: 4.7 mmol/L (ref 3.5–5.1)
SODIUM: 137 mmol/L (ref 135–145)

## 2016-05-05 LAB — HEPARIN LEVEL (UNFRACTIONATED): Heparin Unfractionated: 0.34 IU/mL (ref 0.30–0.70)

## 2016-05-05 SURGERY — RIGHT/LEFT HEART CATH AND CORONARY ANGIOGRAPHY

## 2016-05-05 MED ORDER — SODIUM CHLORIDE 0.9% FLUSH: 3.0000 mL | INTRAVENOUS | Status: DC | PRN

## 2016-05-05 MED ORDER — FENTANYL CITRATE (PF) 100 MCG/2ML IJ SOLN
INTRAMUSCULAR | Status: AC
  Filled 2016-05-05: qty 2

## 2016-05-05 MED ORDER — HEPARIN (PORCINE) IN NACL 2-0.9 UNIT/ML-% IJ SOLN
INTRAMUSCULAR | Status: AC
  Filled 2016-05-05: qty 1000

## 2016-05-05 MED ORDER — SODIUM CHLORIDE 0.9% FLUSH
3.0000 mL | Freq: Two times a day (BID) | INTRAVENOUS | Status: DC
  Administered 2016-05-05 – 2016-05-06 (×3): 3 mL via INTRAVENOUS

## 2016-05-05 MED ORDER — IOPAMIDOL (ISOVUE-370) INJECTION 76%
INTRAVENOUS | Status: DC | PRN
  Administered 2016-05-05: 80 mL via INTRA_ARTERIAL

## 2016-05-05 MED ORDER — HEPARIN (PORCINE) IN NACL 100-0.45 UNIT/ML-% IJ SOLN
1150.0000 [IU]/h | INTRAMUSCULAR | Status: DC
  Administered 2016-05-05 – 2016-05-07 (×2): 1350 [IU]/h via INTRAVENOUS
  Administered 2016-05-08: 1150 [IU]/h via INTRAVENOUS
  Filled 2016-05-05 (×5): qty 250

## 2016-05-05 MED ORDER — IOPAMIDOL (ISOVUE-370) INJECTION 76%
INTRAVENOUS | Status: AC
  Filled 2016-05-05: qty 100

## 2016-05-05 MED ORDER — FUROSEMIDE 10 MG/ML IJ SOLN
80.0000 mg | Freq: Two times a day (BID) | INTRAMUSCULAR | Status: DC
  Administered 2016-05-05 (×2): 80 mg via INTRAVENOUS
  Filled 2016-05-05: qty 8

## 2016-05-05 MED ORDER — LIDOCAINE HCL (PF) 1 % IJ SOLN
INTRAMUSCULAR | Status: AC
  Filled 2016-05-05: qty 30

## 2016-05-05 MED ORDER — SODIUM CHLORIDE 0.9 % IV SOLN: 250.0000 mL | INTRAVENOUS | Status: DC | PRN

## 2016-05-05 MED ORDER — HEPARIN (PORCINE) IN NACL 2-0.9 UNIT/ML-% IJ SOLN
INTRAMUSCULAR | Status: DC | PRN
  Administered 2016-05-05: 1000 mL

## 2016-05-05 MED ORDER — SODIUM CHLORIDE 0.9 % IV SOLN
INTRAVENOUS | Status: AC
  Administered 2016-05-05: 75 mL/h via INTRAVENOUS

## 2016-05-05 MED ORDER — VERAPAMIL HCL 2.5 MG/ML IV SOLN
INTRAVENOUS | Status: DC | PRN
  Administered 2016-05-05: 10 mL via INTRA_ARTERIAL

## 2016-05-05 MED ORDER — FUROSEMIDE 10 MG/ML IJ SOLN
INTRAMUSCULAR | Status: AC
  Filled 2016-05-05: qty 8

## 2016-05-05 MED ORDER — MIDAZOLAM HCL 2 MG/2ML IJ SOLN
INTRAMUSCULAR | Status: DC | PRN
  Administered 2016-05-05: 1 mg via INTRAVENOUS

## 2016-05-05 MED ORDER — HEPARIN SODIUM (PORCINE) 1000 UNIT/ML IJ SOLN
INTRAMUSCULAR | Status: DC | PRN
  Administered 2016-05-05: 3500 [IU] via INTRAVENOUS

## 2016-05-05 MED ORDER — HEPARIN SODIUM (PORCINE) 1000 UNIT/ML IJ SOLN
INTRAMUSCULAR | Status: AC
  Filled 2016-05-05: qty 1

## 2016-05-05 MED ORDER — FENTANYL CITRATE (PF) 100 MCG/2ML IJ SOLN
INTRAMUSCULAR | Status: DC | PRN
  Administered 2016-05-05: 25 ug via INTRAVENOUS

## 2016-05-05 MED ORDER — ACETAMINOPHEN 325 MG PO TABS: 650.0000 mg | ORAL_TABLET | ORAL | Status: DC | PRN

## 2016-05-05 MED ORDER — MIDAZOLAM HCL 2 MG/2ML IJ SOLN
INTRAMUSCULAR | Status: AC
  Filled 2016-05-05: qty 2

## 2016-05-05 MED ORDER — ONDANSETRON HCL 4 MG/2ML IJ SOLN: 4.0000 mg | Freq: Four times a day (QID) | INTRAMUSCULAR | Status: DC | PRN

## 2016-05-05 MED ORDER — LIDOCAINE HCL (PF) 1 % IJ SOLN
INTRAMUSCULAR | Status: DC | PRN
  Administered 2016-05-05 (×2): 2 mL

## 2016-05-05 MED ORDER — VERAPAMIL HCL 2.5 MG/ML IV SOLN
INTRAVENOUS | Status: AC
  Filled 2016-05-05: qty 2

## 2016-05-05 SURGICAL SUPPLY — 16 items
CATH BALLN WEDGE 5F 110CM (CATHETERS) ×3 IMPLANT
CATH EXPO 5F FL3.5 (CATHETERS) ×3 IMPLANT
CATH EXPO 5FR FR4 (CATHETERS) ×3 IMPLANT
COVER PRB 48X5XTLSCP FOLD TPE (BAG) ×1 IMPLANT
COVER PROBE 5X48 (BAG) ×2
DEVICE RAD COMP TR BAND LRG (VASCULAR PRODUCTS) ×3 IMPLANT
GLIDESHEATH SLEND SS 6F .021 (SHEATH) ×3 IMPLANT
GUIDEWIRE INQWIRE 1.5J.035X260 (WIRE) ×1 IMPLANT
INQWIRE 1.5J .035X260CM (WIRE) ×3
KIT HEART LEFT (KITS) ×3 IMPLANT
PACK CARDIAC CATHETERIZATION (CUSTOM PROCEDURE TRAY) ×3 IMPLANT
SHEATH FAST CATH BRACH 5F 5CM (SHEATH) ×3 IMPLANT
TRANSDUCER W/STOPCOCK (MISCELLANEOUS) ×3 IMPLANT
TUBING CIL FLEX 10 FLL-RA (TUBING) ×3 IMPLANT
WIRE EMERALD 3MM-J .025X260CM (WIRE) ×3 IMPLANT
WIRE HI TORQ VERSACORE-J 145CM (WIRE) ×3 IMPLANT

## 2016-05-05 NOTE — Progress Notes (Signed)
[place] for Heparin  Indication: chest pain/ACS, s/p cardiac arrest  Allergies  Allergen Reactions  . Plavix [Clopidogrel Bisulfate] Hives   Patient Measurements: Height: 5\' 5"  (165.1 cm) Weight: 155 lb 9.6 oz (70.6 kg) IBW/kg (Calculated) : 61.5  Vital Signs: Temp: 97.7 F (36.5 C) (01/11 1118) Temp Source: Oral (01/11 1118) BP: 105/61 (01/11 1118) Pulse Rate: 104 (01/11 1118)  Labs:  Recent Labs  05/03/16 0435  05/03/16 2245 05/04/16 0347 05/04/16 0348 05/05/16 0429  HGB 11.1*  --   --   --  12.1* 12.7*  HCT 33.1*  --   --   --  36.2* 38.6*  PLT 231  --   --   --  231 293  HEPARINUNFRC 0.21*  < > 0.40 0.50  --  0.34  CREATININE 1.25*  --   --   --  1.24 1.18  < > = values in this interval not displayed.   Medical History: Past Medical History:  Diagnosis Date  . CAD (coronary artery disease) 2009   AMI in 12/2007 with PCI to LAD, staged PCI to  mid/distal RCA, NSTEMI in 02/2009 with BMS to LCx  . HLD (hyperlipidemia)   . HTN (hypertension)   . Ischemic cardiomyopathy    Admitted in 07/2010 with CHF exacerbation   . sodium chloride 75 mL/hr (05/05/16 0927)  . heparin Stopped (05/05/16 0710)    Assessment: 51 yo M s/p cardiac arrest at outside hospital, on IV heparin for elevated troponin/afib. Heparin level is at goal (0.3) on 1350 units/hr.   -No bleeding issues noted overnight -Hgb stable 12.7  Now s/p L/RHC - found to have 3V CAD with high grade lesions to RCA and LAD, plan to restart heparin post cath and review films with intervention team for possible PCI early next week.  Goal of Therapy:  Heparin level 0.3-0.7 units/ml Monitor platelets by anticoagulation protocol: Yes   Plan:  Restart Heparin infusion at 1350 units/hr this afternoon Daily heparin level, CBC  Erin Hearing PharmD., BCPS Clinical Pharmacist Pager [phone] 05/05/2016 11:55 AM

## 2016-05-05 NOTE — H&P (View-Only) (Signed)
Advanced Heart Failure Rounding Note  Cardiologist: Dr. Geraldo Pitter ([place])  Subjective:    He presented to [facility] on 1/5 after ~2 weeks of worsening HF symptoms.  In OSH Ed, he developed symptomatic tachycardia (AFib/AFl) and received rapid IV amiodarone bolus leading to bradycardia and loss of pulse. Required 1-2 rounds of CPR and intubation. He was started on Dopa+Dobutamine. Transferred to [facility] and transitioned from Dopamine to Levophed.  Extubated 05/01/2016.   ECHO 05/03/2015: EF 15-20%. RV mildly dilated.   Remains on dobutamine at  2.5 mcg. Yesterday diuresed with IV lasix and bidil was increased. Negative 3.9 liters.   Todays CO-OX is 53% on 2.5 mcg dobutamine.   Denies SOB/CP.    Objective:   Weight Range:  Vital Signs:   Temp:  [97.6 F (36.4 C)-98.9 F (37.2 C)] 98 F (36.7 C) (01/10 0300) Pulse Rate:  [108] 108 (01/09 2000) Resp:  [13-28] 20 (01/10 0700) BP: (78-120)/(54-95) 87/60 (01/10 0700) SpO2:  [94 %-96 %] 96 % (01/10 0300) Weight:  [156 lb 8.4 oz (71 kg)] 156 lb 8.4 oz (71 kg) (01/10 0700) Last BM Date:  (UTA)  Weight change: Filed Weights   05/02/16 0432 05/03/16 0500 05/04/16 0700  Weight: 154 lb 5.2 oz (70 kg) 160 lb 6.4 oz (72.8 kg) 156 lb 8.4 oz (71 kg)    Intake/Output:   Intake/Output Summary (Last 24 hours) at 05/04/16 0739 Last data filed at 05/04/16 0700  Gross per 24 hour  Intake           1156.6 ml  Output             5075 ml  Net          -3918.4 ml     Physical Exam: CVP 7-8  General:  Lying in bed. No resp difficulty HEENT: normal Neck: supple. RIJ TLC. JVP 7-8 . Carotids 2+ bilat; no bruits. No lymphadenopathy or thryomegaly appreciated. Cor: PMI laterally displaced. Regular/tachy. No s3 Lungs: clear Abdomen: soft, nontender, nondistended. No hepatosplenomegaly. No bruits or masses. Good bowel sounds. Extremities: no cyanosis, clubbing, rash, edema Neuro: alert & orientedx3, cranial nerves grossly  intact. moves all 4 extremities w/o difficulty. Affect pleasant  Telemetry: ST 100 with PVCs.  Labs: Basic Metabolic Panel:  Recent Labs Lab 04/30/16 1950 05/01/16 0444 05/01/16 1420 05/02/16 0500 05/03/16 0435 05/04/16 0348  NA 138 142  --  137 138 136  K 4.0 4.1  --  3.2* 3.8 3.3*  CL 105 107  --  103 104 100*  CO2 21* 25  --  '25 25 28  '$ GLUCOSE 106* 103*  --  82 96 93  BUN 21* 22*  --  '17 12 10  '$ CREATININE 1.67* 1.62*  --  1.33* 1.25* 1.24  CALCIUM 8.0* 8.2*  --  8.3* 8.8* 9.4  MG 2.1  --  1.7  --   --   --   PHOS 4.3  --   --   --   --   --     Liver Function Tests:  Recent Labs Lab 04/30/16 1950  AST 94*  ALT 149*  ALKPHOS 132*  BILITOT 1.0  PROT 6.4*  ALBUMIN 2.9*   No results for input(s): LIPASE, AMYLASE in the last 168 hours. No results for input(s): AMMONIA in the last 168 hours.  CBC:  Recent Labs Lab 04/30/16 1950 05/01/16 1625 05/02/16 0500 05/03/16 0435 05/04/16 0348  WBC 18.7* 10.8* 10.4 11.0* 10.9*  NEUTROABS 16.2*  --   --   --   --  HGB 12.4* 9.3* 10.5* 11.1* 12.1*  HCT 37.6* 28.5* 32.5* 33.1* 36.2*  MCV 81.9 83.1 83.5 81.3 81.3  PLT 253 174 189 231 231    Cardiac Enzymes:  Recent Labs Lab 04/30/16 1950 05/01/16 0150 05/01/16 0444  TROPONINI 2.01* 1.89* 1.62*    BNP: BNP (last 3 results)  Recent Labs  05/03/16 0435  BNP 1,034.8*    ProBNP (last 3 results) No results for input(s): PROBNP in the last 8760 hours.    Other results:  Imaging: No results found.   Medications:     Scheduled Medications: . aspirin EC  81 mg Oral Daily  . chlorhexidine gluconate (MEDLINE KIT)  15 mL Mouth Rinse BID  . Chlorhexidine Gluconate Cloth  6 each Topical Q0600  . digoxin  0.125 mg Oral Daily  . ferumoxytol  510 mg Intravenous Weekly  . furosemide  80 mg Intravenous [id number]  . Influenza vac split quadrivalent PF  0.5 mL Intramuscular Tomorrow-1000  . isosorbide-hydrALAZINE  1 tablet Oral TID  . mupirocin ointment   1 application Nasal BID  . rosuvastatin  10 mg Oral q1800  . sodium chloride flush  10-40 mL Intracatheter Q12H  . spironolactone  25 mg Oral Daily  . traZODone  100 mg Oral QHS    Infusions: . DOBUTamine 2.5 mcg/kg/min (05/03/16 1100)  . heparin 1,350 Units/hr (05/03/16 2238)    PRN Medications: sodium chloride, acetaminophen, ondansetron (ZOFRAN) IV, sodium chloride flush, traMADol   Assessment:   1. Cardiogenic shock 2. PEA arrest 3. Acute on chronic systolic HF   --EF < 16% due to iCM 4. Acute hypoxemic respiratory failure 5. Elevated troponin 6. PAF 7. CAD    --s/p ('09 PCI to LAD, mRCA, '10 PCI to Lcx and '12 mRCA),  8. CKD, stage III 9. Tobacco use    -says he is smoking 1/2 cigarette per day 10. Anemia  11. Hypokalemia-   Plan/Discussion:    ECHO EF 15-20% RV mildly dilated. Todays CO-OX is 53% on dobutamine 2.5 mcg. Plan to turn off dobutamine at 2300. Aim to cath off dobutamine. Volume status improved. CVP down to 7-8. Give one more dose of IV lasix. Adjust diuretics tomorrow.   No BB for now. Continue spiro 25 mg daily. Renal function ok. Plan RHC/LHC tomorrow.  No Ace/ARB/ARNI for now. Decrease bidil to 1/2 tab bidil three times daily. Had doses held yesterday due to hypotension. Also consider ICD in near future.   By history it seems like he has had worsening HF for 2-3 weeks without angina. Suspect PAF may have put him over the edge. Developed PEA with amio bolus. Now back in NSR. Will continue heparin and will need DOAC on d/c.   Iron stores low- received  Feraheme 05/02/2016. Hgb stable -->12. FOBT.   OOB today. Consult cardiac rehab.   Length of Stay: 4   Amy Clegg NP-C  05/04/2016, 7:39 AM  Advanced Heart Failure Team Pager [phone] (M-F; 7a - 4p)  Please contact [facility] for night-coverage after hours (4p -7a ) and weekends on [email or web address]  Patient seen and examined with Darrick Grinder, NP. We discussed all aspects of the encounter. I agree with the  assessment and plan as stated above.   Volume status much improved CVP 7-8. Co-ox marginal on dobutamine. Will continue to wean. Remains tachycardic. Will add low dose ivabradine. BP low. Will cut back bidil. Discussed L/R heart cath tomorrow. Supp K+. Consult CR.   Bensimhon, Daniel,MD 3:31 PM

## 2016-05-05 NOTE — Progress Notes (Signed)
Advanced Heart Failure Rounding Note  Cardiologist: Dr. Geraldo Pitter ([place])  Subjective:    He presented to [facility] on 1/5 after ~2 weeks of worsening HF symptoms.  In OSH Ed, he developed symptomatic tachycardia (AFib/AFl) and received rapid IV amiodarone bolus leading to bradycardia and loss of pulse. Required 1-2 rounds of CPR and intubation. He was started on Dopa+Dobutamine. Transferred to [facility] and transitioned from Dopamine to Levophed.  Extubated 05/01/2016.   ECHO 05/03/2015: EF 15-20%. RV mildly dilated.    Yesterday dobutamine stopped.  Negative 1.6 liters.   Todays CO-OX is 60% off dobutamine.   Denies SOB/CP.    Objective:   Weight Range:  Vital Signs:   Temp:  [97.7 F (36.5 C)-98.9 F (37.2 C)] 98 F (36.7 C) (01/11 0400) Resp:  [12-34] 33 (01/11 0700) BP: (83-114)/(50-86) 114/86 (01/11 0700) SpO2:  [94 %-95 %] 94 % (01/10 1600) Weight:  [155 lb 9.6 oz (70.6 kg)] 155 lb 9.6 oz (70.6 kg) (01/11 0400) Last BM Date: 05/05/16  Weight change: Filed Weights   05/03/16 0500 05/04/16 0700 05/05/16 0400  Weight: 160 lb 6.4 oz (72.8 kg) 156 lb 8.4 oz (71 kg) 155 lb 9.6 oz (70.6 kg)    Intake/Output:   Intake/Output Summary (Last 24 hours) at 05/05/16 0726 Last data filed at 05/05/16 0700  Gross per 24 hour  Intake          1641.08 ml  Output             3326 ml  Net         -1684.92 ml     Physical Exam: General:  Lying in bed. No resp difficulty HEENT: normal Neck: supple. RIJ TLC. JVP 7-8 . Carotids 2+ bilat; no bruits. No lymphadenopathy or thryomegaly appreciated. Cor: PMI laterally displaced. Regular/tachy. No s3 Lungs: clear Abdomen: soft, nontender, nondistended. No hepatosplenomegaly. No bruits or masses. Good bowel sounds. Extremities: no cyanosis, clubbing, rash, edema Neuro: alert & orientedx3, cranial nerves grossly intact. moves all 4 extremities w/o difficulty. Affect pleasant  Telemetry: SR-ST 90-100s  Labs: Basic  Metabolic Panel:  Recent Labs Lab 04/30/16 1950 05/01/16 0444 05/01/16 1420 05/02/16 0500 05/03/16 0435 05/04/16 0348 05/05/16 0429  NA 138 142  --  137 138 136 137  K 4.0 4.1  --  3.2* 3.8 3.3* 4.7  CL 105 107  --  103 104 100* 105  CO2 21* 25  --  25 25 28 23   GLUCOSE 106* 103*  --  82 96 93 93  BUN 21* 22*  --  17 12 10 10   CREATININE 1.67* 1.62*  --  1.33* 1.25* 1.24 1.18  CALCIUM 8.0* 8.2*  --  8.3* 8.8* 9.4 9.7  MG 2.1  --  1.7  --   --   --   --   PHOS 4.3  --   --   --   --   --   --     Liver Function Tests:  Recent Labs Lab 04/30/16 1950  AST 94*  ALT 149*  ALKPHOS 132*  BILITOT 1.0  PROT 6.4*  ALBUMIN 2.9*   No results for input(s): LIPASE, AMYLASE in the last 168 hours. No results for input(s): AMMONIA in the last 168 hours.  CBC:  Recent Labs Lab 04/30/16 1950 05/01/16 1625 05/02/16 0500 05/03/16 0435 05/04/16 0348 05/05/16 0429  WBC 18.7* 10.8* 10.4 11.0* 10.9* 10.5  NEUTROABS 16.2*  --   --   --   --   --  HGB 12.4* 9.3* 10.5* 11.1* 12.1* 12.7*  HCT 37.6* 28.5* 32.5* 33.1* 36.2* 38.6*  MCV 81.9 83.1 83.5 81.3 81.3 81.4  PLT 253 174 189 231 231 293    Cardiac Enzymes:  Recent Labs Lab 04/30/16 1950 05/01/16 0150 05/01/16 0444  TROPONINI 2.01* 1.89* 1.62*    BNP: BNP (last 3 results)  Recent Labs  05/03/16 0435  BNP 1,034.8*    ProBNP (last 3 results) No results for input(s): PROBNP in the last 8760 hours.    Other results:  Imaging: No results found.   Medications:     Scheduled Medications: . [START ON 05/06/2016] aspirin EC  81 mg Oral Daily  . Chlorhexidine Gluconate Cloth  6 each Topical Q0600  . digoxin  0.125 mg Oral Daily  . ferumoxytol  510 mg Intravenous Weekly  . isosorbide-hydrALAZINE  0.5 tablet Oral TID  . ivabradine  2.5 mg Oral BID WC  . mupirocin ointment  1 application Nasal BID  . rosuvastatin  10 mg Oral q1800  . sodium chloride flush  10-40 mL Intracatheter Q12H  . sodium chloride  flush  3 mL Intravenous Q12H  . spironolactone  25 mg Oral Daily  . traZODone  100 mg Oral QHS    Infusions: . sodium chloride 10 mL/hr at 05/05/16 0000  . heparin 1,350 Units/hr (05/04/16 1839)    PRN Medications: sodium chloride, sodium chloride, acetaminophen, [START ON 05/06/2016] Influenza vac split quadrivalent PF, ondansetron (ZOFRAN) IV, sodium chloride flush, sodium chloride flush, traMADol   Assessment:   1. Cardiogenic shock 2. PEA arrest 3. Acute on chronic systolic HF   --EF < 83% due to iCM 4. Acute hypoxemic respiratory failure 5. Elevated troponin 6. PAF 7. CAD    --s/p ('09 PCI to LAD, mRCA, '10 PCI to Lcx and '12 mRCA),  8. CKD, stage III 9. Tobacco use    -says he is smoking 1/2 cigarette per day 10. Anemia  11. Hypokalemia-   Plan/Discussion:    ECHO EF 15-20% RV mildly dilated. Todays CO-OX is 60% off dobutamine.  Adjust diuretics after cath.   No BB for now. Continue spiro 25 mg daily. Renal function ok. Plan RHC/LHC today   No Ace/ARB/ARNI for now. Continue Continue bidil to 1/2 tab bidil three times daily.   By history it seems like he has had worsening HF for 2-3 weeks without angina. Suspect PAF may have put him over the edge. Developed PEA with amio bolus. Now back in NSR. Will continue heparin and will need DOAC on d/c.   Iron stores low- received  Feraheme 05/02/2016. Hgb stable -->12.7 FOBT.   OOB today. Consult cardiac rehab.   For RHC/LHC today.   Length of Stay: 5   Amy Clegg NP-C  05/05/2016, 7:26 AM  Advanced Heart Failure Team Pager [phone] (M-F; 7a - 4p)  Please contact [facility] for night-coverage after hours (4p -7a ) and weekends on [email or web address]  Patient seen and examined with Darrick Grinder, NP. We discussed all aspects of the encounter. I agree with the assessment and plan as stated above.   Off dobutamine. Co-ox looks good. CVP ok. On Bidil, spiro, digoxin and now corlanor. No recurrent AF. Rhythm appears sinus but cannot  exclude atrial tach. Plan R/L cath today which we discussed. Will need DOAC after cath.   If cath ok. Will need outpatient CPX to assess need for advanced therapies. Will need ICD at some point.   Bensimhon, Daniel,MD 7:50 AM

## 2016-05-05 NOTE — Interval H&P Note (Signed)
History and Physical Interval Note:  05/05/2016 7:47 AM  Glean Salvo  has presented today for surgery, with the diagnosis of hf  The various methods of treatment have been discussed with the patient and family. After consideration of risks, benefits and other options for treatment, the patient has consented to  Procedure(s): Right/Left Heart Cath and Coronary Angiography (N/A) and possible coronary angioplasty as a surgical intervention .  The patient's history has been reviewed, patient examined, no change in status, stable for surgery.  I have reviewed the patient's chart and labs.  Questions were answered to the patient's satisfaction.    Cath Lab Visit (complete for each Cath Lab visit)  Clinical Evaluation Leading to the Procedure:   ACS: Yes.    Non-ACS:    Anginal Classification: CCS IV  Anti-ischemic medical therapy: Minimal Therapy (1 class of medications)  Non-Invasive Test Results: No non-invasive testing performed  Prior CABG: No previous CABG       Bensimhon, Quillian Quince

## 2016-05-06 DIAGNOSIS — N179 Acute kidney failure, unspecified: Secondary | ICD-10-CM

## 2016-05-06 LAB — BASIC METABOLIC PANEL
ANION GAP: 12 (ref 5–15)
ANION GAP: 14 (ref 5–15)
BUN: 15 mg/dL (ref 6–20)
BUN: 18 mg/dL (ref 6–20)
CALCIUM: 10 mg/dL (ref 8.9–10.3)
CALCIUM: 10.3 mg/dL (ref 8.9–10.3)
CO2: 25 mmol/L (ref 22–32)
CO2: 25 mmol/L (ref 22–32)
Chloride: 98 mmol/L — ABNORMAL LOW (ref 101–111)
Chloride: 96 mmol/L — ABNORMAL LOW (ref 101–111)
Creatinine, Ser: 1.48 mg/dL — ABNORMAL HIGH (ref 0.61–1.24)
Creatinine, Ser: 1.5 mg/dL — ABNORMAL HIGH (ref 0.61–1.24)
GFR calc non Af Amer: 53 mL/min — ABNORMAL LOW (ref >60)
GFR calc non Af Amer: 52 mL/min — ABNORMAL LOW (ref >60)
Glucose, Bld: 104 mg/dL — ABNORMAL HIGH (ref 65–99)
Glucose, Bld: 90 mg/dL (ref 65–99)
Potassium: 4.1 mmol/L (ref 3.5–5.1)
Potassium: 4.8 mmol/L (ref 3.5–5.1)
SODIUM: 135 mmol/L (ref 135–145)
Sodium: 135 mmol/L (ref 135–145)

## 2016-05-06 LAB — CBC
HEMATOCRIT: 40.9 % (ref 39.0–52.0)
Hemoglobin: 13.9 g/dL (ref 13.0–17.0)
MCH: 27.5 pg (ref 26.0–34.0)
MCHC: 34 g/dL (ref 30.0–36.0)
MCV: 80.8 fL (ref 78.0–100.0)
Platelets: 298 10*3/uL (ref 150–400)
RBC: 5.06 MIL/uL (ref 4.22–5.81)
RDW: 15.9 % — ABNORMAL HIGH (ref 11.5–15.5)
WBC: 10.9 10*3/uL — AB (ref 4.0–10.5)

## 2016-05-06 LAB — COOXEMETRY PANEL
Carboxyhemoglobin: 1.7 % — ABNORMAL HIGH (ref 0.5–1.5)
Methemoglobin: 0.8 % (ref 0.0–1.5)
O2 Saturation: 59.1 %
Total hemoglobin: 14.3 g/dL (ref 12.0–16.0)

## 2016-05-06 LAB — HEPARIN LEVEL (UNFRACTIONATED): Heparin Unfractionated: 0.46 IU/mL (ref 0.30–0.70)

## 2016-05-06 LAB — MAGNESIUM: MAGNESIUM: 2.1 mg/dL (ref 1.7–2.4)

## 2016-05-06 LAB — DIGOXIN LEVEL: Digoxin Level: 0.4 ng/mL — ABNORMAL LOW (ref 0.8–2.0)

## 2016-05-06 MED ORDER — FUROSEMIDE 10 MG/ML IJ SOLN
80.0000 mg | Freq: Once | INTRAMUSCULAR | Status: AC
  Administered 2016-05-06: 80 mg via INTRAVENOUS
  Filled 2016-05-06: qty 8

## 2016-05-06 MED ORDER — POTASSIUM CHLORIDE CRYS ER 20 MEQ PO TBCR: 40.0000 meq | EXTENDED_RELEASE_TABLET | Freq: Every day | ORAL | Status: DC

## 2016-05-06 NOTE — Progress Notes (Signed)
[place] for Heparin  Indication: chest pain/ACS, s/p cardiac arrest  Allergies  Allergen Reactions  . Plavix [Clopidogrel Bisulfate] Hives   Patient Measurements: Height: 5\' 5"  (165.1 cm) Weight: 151 lb 9.6 oz (68.8 kg) IBW/kg (Calculated) : 61.5  Vital Signs: Temp: 98 F (36.7 C) (01/12 0000) Temp Source: Oral (01/12 0000) BP: 101/67 (01/12 0600) Pulse Rate: 86 (01/12 0600)  Labs:  Recent Labs  05/04/16 0347  05/04/16 0348 05/05/16 0429 05/06/16 0359 05/06/16 0400  HGB  --   < > 12.1* 12.7* 13.9  --   HCT  --   --  36.2* 38.6* 40.9  --   PLT  --   --  231 293 298  --   HEPARINUNFRC 0.50  --   --  0.34  --  0.46  CREATININE  --   --  1.24 1.18 1.48*  --   < > = values in this interval not displayed.   Medical History: Past Medical History:  Diagnosis Date  . CAD (coronary artery disease) 2009   AMI in 12/2007 with PCI to LAD, staged PCI to  mid/distal RCA, NSTEMI in 02/2009 with BMS to LCx  . HLD (hyperlipidemia)   . HTN (hypertension)   . Ischemic cardiomyopathy    Admitted in 07/2010 with CHF exacerbation   . heparin 1,350 Units/hr (05/05/16 1739)    Assessment: 51 yo M s/p cardiac arrest at outside hospital, on IV heparin for elevated troponin/afib.  Heparin restarted post cath yesterday, level continues to be at goal (0.4) on previous rate.  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  Goal of Therapy:  Heparin level 0.3-0.7 units/ml Monitor platelets by anticoagulation protocol: Yes   Plan:  Heparin infusion at 1350 units/hr this afternoon Daily heparin level, CBC  Erin Hearing PharmD., BCPS Clinical Pharmacist Pager [phone] 05/06/2016 7:43 AM

## 2016-05-06 NOTE — Progress Notes (Signed)
Advanced Heart Failure Rounding Note  Cardiologist: Dr. Geraldo Pitter ([place])  Subjective:    He presented to [facility] on 1/5 after ~2 weeks of worsening HF symptoms.  In OSH Ed, he developed symptomatic tachycardia (AFib/AFl) and received rapid IV amiodarone bolus leading to bradycardia and loss of pulse. Required 1-2 rounds of CPR and intubation. He was started on Dopa+Dobutamine. Transferred to [facility] and transitioned from Dopamine to Levophed.  Extubated 05/01/2016.   ECHO 05/03/2015: EF 15-20%. RV mildly dilated.   Yesterday LHC showed 3V CAD high grade lesion RCA and LAD. Plan for PCI next week. IV lasix restarted due to elevated PCWP.  Negative 1.6 liters. Todays CO-OX is 59%. Creatinine trending up 1.18>1.48  Denies SOB/CP.   RHC/LHC 05/06/2016   Prox RCA to Mid RCA lesion, 80 %stenosed.  Mid RCA lesion, 40 %stenosed.  Dist RCA lesion, 20 %stenosed.  Post Atrio lesion, 80 %stenosed.  Ost Cx to Mid Cx lesion, 40 %stenosed.  Mid LAD to Dist LAD lesion, 70 %stenosed.  Dist LAD lesion, 80 %stenosed.  Findings: Ao = 101/75 (87) LV = 102/35 RA =  8 RV =  62/10 PA =  65/34 (47) PCW = 37 (v = 45) Fick cardiac output/index = 3.8/2.1 PVR = 2.9 WU SVR = 1555 FA sat = 96% PA sat = 60%. 61%    Objective:   Weight Range:  Vital Signs:   Temp:  [97.7 F (36.5 C)-98.3 F (36.8 C)] 98 F (36.7 C) (01/12 0000) Pulse Rate:  [86-113] 86 (01/12 0600) Resp:  [10-37] 22 (01/12 0600) BP: (83-137)/(56-97) 101/67 (01/12 0600) SpO2:  [87 %-98 %] 94 % (01/12 0600) Weight:  [151 lb 9.6 oz (68.8 kg)] 151 lb 9.6 oz (68.8 kg) (01/12 0500) Last BM Date: 05/05/16  Weight change: Filed Weights   05/04/16 0700 05/05/16 0400 05/06/16 0500  Weight: 156 lb 8.4 oz (71 kg) 155 lb 9.6 oz (70.6 kg) 151 lb 9.6 oz (68.8 kg)    Intake/Output:   Intake/Output Summary (Last 24 hours) at 05/06/16 0743 Last data filed at 05/06/16 0600  Gross per 24 hour  Intake            1855.5 ml  Output             3475 ml  Net          -1619.5 ml     Physical Exam: CVP 2  General:  Lying in bed. No resp difficulty HEENT: normal Neck: supple. RIJ TLC. JVP 5-6 . Carotids 2+ bilat; no bruits. No lymphadenopathy or thryomegaly appreciated. Cor: PMI laterally displaced. Regular/tachy. No s3 Lungs: clear Abdomen: soft, nontender, nondistended. No hepatosplenomegaly. No bruits or masses. Good bowel sounds. Extremities: no cyanosis, clubbing, rash, edema Neuro: alert & orientedx3, cranial nerves grossly intact. moves all 4 extremities w/o difficulty. Affect pleasant  Telemetry: SR 90s  Labs: Basic Metabolic Panel:  Recent Labs Lab 04/30/16 1950  05/01/16 1420 05/02/16 0500 05/03/16 0435 05/04/16 0348 05/05/16 0429 05/06/16 0359  NA 138  < >  --  137 138 136 137 135  K 4.0  < >  --  3.2* 3.8 3.3* 4.7 4.1  CL 105  < >  --  103 104 100* 105 98*  CO2 21*  < >  --  25 25 28 23 25   GLUCOSE 106*  < >  --  82 96 93 93 104*  BUN 21*  < >  --  17 12 10  10  15  CREATININE 1.67*  < >  --  1.33* 1.25* 1.24 1.18 1.48*  CALCIUM 8.0*  < >  --  8.3* 8.8* 9.4 9.7 10.0  MG 2.1  --  1.7  --   --   --   --   --   PHOS 4.3  --   --   --   --   --   --   --   < > = values in this interval not displayed.  Liver Function Tests:  Recent Labs Lab 04/30/16 1950  AST 94*  ALT 149*  ALKPHOS 132*  BILITOT 1.0  PROT 6.4*  ALBUMIN 2.9*   No results for input(s): LIPASE, AMYLASE in the last 168 hours. No results for input(s): AMMONIA in the last 168 hours.  CBC:  Recent Labs Lab 04/30/16 1950  05/02/16 0500 05/03/16 0435 05/04/16 0348 05/05/16 0429 05/06/16 0359  WBC 18.7*  < > 10.4 11.0* 10.9* 10.5 10.9*  NEUTROABS 16.2*  --   --   --   --   --   --   HGB 12.4*  < > 10.5* 11.1* 12.1* 12.7* 13.9  HCT 37.6*  < > 32.5* 33.1* 36.2* 38.6* 40.9  MCV 81.9  < > 83.5 81.3 81.3 81.4 80.8  PLT 253  < > 189 231 231 293 298  < > = values in this interval not displayed.  Cardiac  Enzymes:  Recent Labs Lab 04/30/16 1950 05/01/16 0150 05/01/16 0444  TROPONINI 2.01* 1.89* 1.62*    BNP: BNP (last 3 results)  Recent Labs  05/03/16 0435  BNP 1,034.8*    ProBNP (last 3 results) No results for input(s): PROBNP in the last 8760 hours.    Other results:  Imaging: No results found.   Medications:     Scheduled Medications: . aspirin EC  81 mg Oral Daily  . digoxin  0.125 mg Oral Daily  . ferumoxytol  510 mg Intravenous Weekly  . furosemide  80 mg Intravenous BID  . isosorbide-hydrALAZINE  0.5 tablet Oral TID  . ivabradine  2.5 mg Oral BID WC  . mupirocin ointment  1 application Nasal BID  . rosuvastatin  10 mg Oral q1800  . sodium chloride flush  10-40 mL Intracatheter Q12H  . sodium chloride flush  3 mL Intravenous Q12H  . spironolactone  25 mg Oral Daily  . traZODone  100 mg Oral QHS    Infusions: . heparin 1,350 Units/hr (05/05/16 1739)    PRN Medications: sodium chloride, sodium chloride, acetaminophen, ondansetron (ZOFRAN) IV, sodium chloride flush, sodium chloride flush, traMADol   Assessment:   1. Cardiogenic shock 2. PEA arrest 3. Acute on chronic systolic HF   --EF < 61% due to iCM 4. Acute hypoxemic respiratory failure 5. Elevated troponin 6. PAF 7. CAD    --LHC -->3v CAD with several high grade lesions in RCA and LAD. Plan for PCI next week.  s/p ('09 PCI to LAD, mRCA, '10 PCI to Lcx and '12 mRCA),  8. CKD, stage III 9. Tobacco use    -says he is smoking 1/2 cigarette per day 10. Anemia     --received  Feraheme 05/02/2016. Hgb stable -->12.7 FOBT.  11. Hypokalemia-     -resolved  Plan/Discussion:    S/P LHC/RHC. Elevated wedge pressure. 3v CAD with several high grade lesions in RCA and LAD. Plan for PCI next week. Creatinine trending up. Will need to watch closely. ?CIN. BMET today at 1300.  ECHO EF 15-20%  RV mildly dilated.    Todays CO-OX is 59% .Wedge pressure up on RHC. Todays CVP 2.  Give one more dose of IV  lasix 80 mg. No BB for now. Continue spiro 25 mg daily. Renal function as above trending up. No Ace/ARB/ARNI for now due to hypotension. Continue Continue bidil to 1/2 tab bidil three times daily. Continue corlanor 2.5 mg twice a day.   OOB today. Continue cardiac rehab.  .  Length of Stay: [place] NP-C  05/06/2016, 7:43 AM  Advanced Heart Failure Team Pager [phone] (M-F; 7a - 4p)  Please contact [facility] for night-coverage after hours (4p -7a ) and weekends on [email or web address]  Patient seen and examined with Darrick Grinder, NP. We discussed all aspects of the encounter. I agree with the assessment and plan as stated above.   Denies CP or SOB. Diuresis slowing down. CVP down to 2 and creatinine now trending up. Will stop IV diuresis. Also have to watch for component of possible contrast nephropathy. Co-ox borderline but probably sufficient. Will not restart dobutamine.   Cath films reviewed with Dr. Burt Knack. Will plan PCI of RCA +/- LAD on Monday. Remains in sinus. Rate much improved with ivabradine. Will continue heparin. Switch to DOAC after PCI. Watch renal function closely over the weekend.   Creighton Longley,MD 6:04 PM

## 2016-05-07 DIAGNOSIS — I25118 Atherosclerotic heart disease of native coronary artery with other forms of angina pectoris: Secondary | ICD-10-CM

## 2016-05-07 LAB — BASIC METABOLIC PANEL
ANION GAP: 11 (ref 5–15)
ANION GAP: 13 (ref 5–15)
BUN: 21 mg/dL — ABNORMAL HIGH (ref 6–20)
BUN: 20 mg/dL (ref 6–20)
CALCIUM: 9.6 mg/dL (ref 8.9–10.3)
CALCIUM: 9.7 mg/dL (ref 8.9–10.3)
CO2: 25 mmol/L (ref 22–32)
CO2: 24 mmol/L (ref 22–32)
CREATININE: 1.37 mg/dL — AB (ref 0.61–1.24)
Chloride: 99 mmol/L — ABNORMAL LOW (ref 101–111)
Chloride: 97 mmol/L — ABNORMAL LOW (ref 101–111)
Creatinine, Ser: 1.49 mg/dL — ABNORMAL HIGH (ref 0.61–1.24)
GFR calc Af Amer: >60 mL/min (ref >60)
GFR calc non Af Amer: 52 mL/min — ABNORMAL LOW (ref >60)
GFR calc non Af Amer: 58 mL/min — ABNORMAL LOW (ref >60)
GLUCOSE: 94 mg/dL (ref 65–99)
Glucose, Bld: 146 mg/dL — ABNORMAL HIGH (ref 65–99)
POTASSIUM: 4 mmol/L (ref 3.5–5.1)
Potassium: 3.9 mmol/L (ref 3.5–5.1)
SODIUM: 134 mmol/L — AB (ref 135–145)
Sodium: 135 mmol/L (ref 135–145)

## 2016-05-07 LAB — HEPARIN LEVEL (UNFRACTIONATED): Heparin Unfractionated: 0.64 IU/mL (ref 0.30–0.70)

## 2016-05-07 LAB — COOXEMETRY PANEL
CARBOXYHEMOGLOBIN: 1.6 % — AB (ref 0.5–1.5)
METHEMOGLOBIN: 0.8 % (ref 0.0–1.5)
O2 SAT: 58.5 %
Total hemoglobin: 14 g/dL (ref 12.0–16.0)

## 2016-05-07 LAB — CBC
HEMATOCRIT: 40.3 % (ref 39.0–52.0)
HEMOGLOBIN: 14 g/dL (ref 13.0–17.0)
MCH: 28.2 pg (ref 26.0–34.0)
MCHC: 34.7 g/dL (ref 30.0–36.0)
MCV: 81.1 fL (ref 78.0–100.0)
Platelets: 317 10*3/uL (ref 150–400)
RBC: 4.97 MIL/uL (ref 4.22–5.81)
RDW: 16.1 % — AB (ref 11.5–15.5)
WBC: 12.1 10*3/uL — AB (ref 4.0–10.5)

## 2016-05-07 MED ORDER — FUROSEMIDE 80 MG PO TABS
80.0000 mg | ORAL_TABLET | Freq: Every day | ORAL | Status: DC
  Administered 2016-05-07: 80 mg via ORAL
  Filled 2016-05-07: qty 1

## 2016-05-07 NOTE — Progress Notes (Addendum)
Patient ID: David [name], male   DOB: January 16, 1966, 51 y.o.   MRN: [id number]    Advanced Heart Failure Rounding Note  Cardiologist: Dr. Geraldo Pitter ([facility]ia Alert)  Subjective:    He presented to [facility] on 1/5 after ~2 weeks of worsening HF symptoms.  In OSH Ed, he developed symptomatic tachycardia (AFib/AFl) and received rapid IV amiodarone bolus leading to bradycardia and loss of pulse. Required 1-2 rounds of CPR and intubation. He was started on Dopa+Dobutamine. Transferred to [facility] and transitioned from Dopamine to Levophed.  Extubated 05/01/2016.   ECHO 05/03/2015: EF 15-20%. RV mildly dilated.   1/11 LHC showed 3V CAD high grade lesion RCA and LAD. Plan for PCI next week.  IV Lasix last dose on 1/12. Today's CO-OX is 59%. Creatinine stable today 1.18>1.48>1.5>1.49.  CVP not hooked up.   Denies SOB/CP.   RHC/LHC 05/06/2016   Prox RCA to Mid RCA lesion, 80 %stenosed.  Mid RCA lesion, 40 %stenosed.  Dist RCA lesion, 20 %stenosed.  Post Atrio lesion, 80 %stenosed.  Ost Cx to Mid Cx lesion, 40 %stenosed.  Mid LAD to Dist LAD lesion, 70 %stenosed.  Dist LAD lesion, 80 %stenosed.  Findings: Ao = 101/75 (87) LV = 102/35 RA =  8 RV =  62/10 PA =  65/34 (47) PCW = 37 (v = 45) Fick cardiac output/index = 3.8/2.1 PVR = 2.9 WU SVR = 1555 FA sat = 96% PA sat = 60%. 61%    Objective:   Weight Range:  Vital Signs:   Temp:  [97.7 F (36.5 C)-98.4 F (36.9 C)] 97.7 F (36.5 C) (01/13 0816) Pulse Rate:  [84-96] 84 (01/13 0332) Resp:  [11-28] 13 (01/13 0816) BP: (81-131)/(60-109) 91/60 (01/13 0816) SpO2:  [92 %-97 %] 97 % (01/13 0816) Weight:  [152 lb 8 oz (69.2 kg)] 152 lb 8 oz (69.2 kg) (01/13 0332) Last BM Date: 05/05/16  Weight change: Filed Weights   05/05/16 0400 05/06/16 0500 05/07/16 0332  Weight: 155 lb 9.6 oz (70.6 kg) 151 lb 9.6 oz (68.8 kg) 152 lb 8 oz (69.2 kg)    Intake/Output:   Intake/Output Summary (Last 24 hours) at 05/07/16 0824 Last  data filed at 05/07/16 0600  Gross per 24 hour  Intake              417 ml  Output             1200 ml  Net             -783 ml     Physical Exam: General:  Lying in bed. No resp difficulty HEENT: normal Neck: supple. RIJ TLC. JVP not elevated. Carotids 2+ bilat; no bruits. No lymphadenopathy or thryomegaly appreciated. Cor: PMI laterally displaced. Regular/tachy. No s3 Lungs: clear Abdomen: soft, nontender, nondistended. No hepatosplenomegaly. No bruits or masses. Good bowel sounds. Extremities: no cyanosis, clubbing, rash, edema Neuro: alert & orientedx3, cranial nerves grossly intact. moves all 4 extremities w/o difficulty. Affect pleasant  Telemetry: SR 90s  Labs: Basic Metabolic Panel:  Recent Labs Lab 04/30/16 1950  05/01/16 1420  05/04/16 0348 05/05/16 0429 05/06/16 0359 05/06/16 1240 05/07/16 0448  NA 138  < >  --   < > 136 137 135 135 135  K 4.0  < >  --   < > 3.3* 4.7 4.1 4.8 4.0  CL 105  < >  --   < > 100* 105 98* 96* 99*  CO2 21*  < >  --   < >  28 23 25 25 25   GLUCOSE 106*  < >  --   < > 93 93 104* 90 94  BUN 21*  < >  --   < > 10 10 15 18  21*  CREATININE 1.67*  < >  --   < > 1.24 1.18 1.48* 1.50* 1.49*  CALCIUM 8.0*  < >  --   < > 9.4 9.7 10.0 10.3 9.6  MG 2.1  --  1.7  --   --   --   --  2.1  --   PHOS 4.3  --   --   --   --   --   --   --   --   < > = values in this interval not displayed.  Liver Function Tests:  Recent Labs Lab 04/30/16 1950  AST 94*  ALT 149*  ALKPHOS 132*  BILITOT 1.0  PROT 6.4*  ALBUMIN 2.9*   No results for input(s): LIPASE, AMYLASE in the last 168 hours. No results for input(s): AMMONIA in the last 168 hours.  CBC:  Recent Labs Lab 04/30/16 1950  05/03/16 0435 05/04/16 0348 05/05/16 0429 05/06/16 0359 05/07/16 0448  WBC 18.7*  < > 11.0* 10.9* 10.5 10.9* 12.1*  NEUTROABS 16.2*  --   --   --   --   --   --   HGB 12.4*  < > 11.1* 12.1* 12.7* 13.9 14.0  HCT 37.6*  < > 33.1* 36.2* 38.6* 40.9 40.3  MCV 81.9  < >  81.3 81.3 81.4 80.8 81.1  PLT 253  < > 231 231 293 298 317  < > = values in this interval not displayed.  Cardiac Enzymes:  Recent Labs Lab 04/30/16 1950 05/01/16 0150 05/01/16 0444  TROPONINI 2.01* 1.89* 1.62*    BNP: BNP (last 3 results)  Recent Labs  05/03/16 0435  BNP 1,034.8*    ProBNP (last 3 results) No results for input(s): PROBNP in the last 8760 hours.    Other results:  Imaging: No results found.   Medications:     Scheduled Medications: . aspirin EC  81 mg Oral Daily  . digoxin  0.125 mg Oral Daily  . ferumoxytol  510 mg Intravenous Weekly  . furosemide  80 mg Oral Daily  . isosorbide-hydrALAZINE  0.5 tablet Oral TID  . ivabradine  2.5 mg Oral BID WC  . rosuvastatin  10 mg Oral q1800  . sodium chloride flush  10-40 mL Intracatheter Q12H  . sodium chloride flush  3 mL Intravenous Q12H  . spironolactone  25 mg Oral Daily  . traZODone  100 mg Oral QHS    Infusions: . heparin 1,350 Units/hr (05/06/16 2000)    PRN Medications: sodium chloride, sodium chloride, acetaminophen, ondansetron (ZOFRAN) IV, sodium chloride flush, sodium chloride flush, traMADol   Assessment:   1. Cardiogenic shock 2. PEA arrest 3. Acute on chronic systolic HF   --EF < 78% due to ischemic cardiomyopathy.  4. Acute hypoxemic respiratory failure 5. Elevated troponin 6. PAF 7. CAD    --LHC -->3v CAD with several high grade lesions in RCA and LAD. Plan for PCI next week.  s/p ('09 PCI to LAD, mRCA, '10 PCI to Lcx and '12 mRCA),  8. CKD, stage III 9. Tobacco use    -says he is smoking 1/2 cigarette per day 10. Anemia     --received  Feraheme 05/02/2016. Hgb stable -->12.7 FOBT.  11. Hypokalemia-     -resolved  Plan/Discussion:  Stable this morning.  Remains in NSR.  Looks euvolemic on exam.  Will continue CVP monitoring while in unit (not hooked up).  - Can restart Lasix 80 mg daily (home dose).    Co-ox stable this morning.  Continue digoxin, ivabradine,  spironolactone.  No BP room at this time for ACEI/ARB/Coreg.    Will plan PCI of RCA +/- LAD on Monday. Renal function remaining stable.  - Post-cath, would plan on therapy with ticagrelor + Xarelto 15 (Plavix allergy with hives).  Would not send home on ASA given need for anticoagulation and ticagrelor.    Mattix Imhof,MD 8:24 AM 05/07/2016

## 2016-05-07 NOTE — Progress Notes (Signed)
[facility] for Heparin  Indication: chest pain/ACS, s/p cardiac arrest  Allergies  Allergen Reactions  . Plavix [Clopidogrel Bisulfate] Hives   Patient Measurements: Height: 5\' 5"  (165.1 cm) Weight: 152 lb 8 oz (69.2 kg) IBW/kg (Calculated) : 61.5  Vital Signs: Temp: 98.3 F (36.8 C) (01/13 0332) Temp Source: Oral (01/13 0332) BP: 94/63 (01/13 0332) Pulse Rate: 84 (01/13 0332)  Labs:  Recent Labs  05/05/16 0429 05/06/16 0359 05/06/16 0400 05/06/16 1240 05/07/16 0342 05/07/16 0448  HGB 12.7* 13.9  --   --   --  14.0  HCT 38.6* 40.9  --   --   --  40.3  PLT 293 298  --   --   --  317  HEPARINUNFRC 0.34  --  0.46  --  0.64  --   CREATININE 1.18 1.48*  --  1.50*  --  1.49*     Medical History: Past Medical History:  Diagnosis Date  . CAD (coronary artery disease) 2009   AMI in 12/2007 with PCI to LAD, staged PCI to  mid/distal RCA, NSTEMI in 02/2009 with BMS to LCx  . HLD (hyperlipidemia)   . HTN (hypertension)   . Ischemic cardiomyopathy    Admitted in 07/2010 with CHF exacerbation   . heparin 1,350 Units/hr (05/06/16 2000)    Assessment: 51 yo M s/p cardiac arrest at outside hospital, on IV heparin for elevated troponin/afib.  Heparin restarted post cath,heparin level continues to be at goal (0.6) on heparin drip rate 1350 uts/hr.  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  Goal of Therapy:  Heparin level 0.3-0.7 units/ml Monitor platelets by anticoagulation protocol: Yes   Plan:  Heparin infusion at 1350 units/hr  Daily heparin level, CBC   Bonnita Nasuti Pharm.D. CPP, BCPS Clinical Pharmacist [phone] 05/07/2016 7:36 AM

## 2016-05-08 LAB — CBC
HCT: 40.9 % (ref 39.0–52.0)
Hemoglobin: 13.9 g/dL (ref 13.0–17.0)
MCH: 27.9 pg (ref 26.0–34.0)
MCHC: 34 g/dL (ref 30.0–36.0)
MCV: 82.1 fL (ref 78.0–100.0)
PLATELETS: 330 10*3/uL (ref 150–400)
RBC: 4.98 MIL/uL (ref 4.22–5.81)
RDW: 16.3 % — AB (ref 11.5–15.5)
WBC: 11.6 10*3/uL — ABNORMAL HIGH (ref 4.0–10.5)

## 2016-05-08 LAB — COOXEMETRY PANEL
Carboxyhemoglobin: 1.7 % — ABNORMAL HIGH (ref 0.5–1.5)
Methemoglobin: 0.7 % (ref 0.0–1.5)
O2 SAT: 65.6 %
TOTAL HEMOGLOBIN: 14 g/dL (ref 12.0–16.0)

## 2016-05-08 LAB — BASIC METABOLIC PANEL
Anion gap: 9 (ref 5–15)
BUN: 21 mg/dL — ABNORMAL HIGH (ref 6–20)
CO2: 25 mmol/L (ref 22–32)
CREATININE: 1.51 mg/dL — AB (ref 0.61–1.24)
Calcium: 9.6 mg/dL (ref 8.9–10.3)
Chloride: 100 mmol/L — ABNORMAL LOW (ref 101–111)
GFR, EST AFRICAN AMERICAN: 60 mL/min — AB (ref >60)
GFR, EST NON AFRICAN AMERICAN: 51 mL/min — AB (ref >60)
Glucose, Bld: 99 mg/dL (ref 65–99)
Potassium: 3.9 mmol/L (ref 3.5–5.1)
SODIUM: 134 mmol/L — AB (ref 135–145)

## 2016-05-08 LAB — GLUCOSE, CAPILLARY: GLUCOSE-CAPILLARY: 117 mg/dL — AB (ref 65–99)

## 2016-05-08 LAB — HEPARIN LEVEL (UNFRACTIONATED)
HEPARIN UNFRACTIONATED: 0.82 [IU]/mL — AB (ref 0.30–0.70)
HEPARIN UNFRACTIONATED: 0.69 [IU]/mL (ref 0.30–0.70)

## 2016-05-08 MED ORDER — SODIUM CHLORIDE 0.9% FLUSH: 3.0000 mL | Freq: Two times a day (BID) | INTRAVENOUS | Status: DC

## 2016-05-08 MED ORDER — SODIUM CHLORIDE 0.9 % IV SOLN
INTRAVENOUS | Status: DC
  Administered 2016-05-09: 06:00:00 via INTRAVENOUS

## 2016-05-08 MED ORDER — SODIUM CHLORIDE 0.9 % IV SOLN: 250.0000 mL | INTRAVENOUS | Status: DC | PRN

## 2016-05-08 MED ORDER — SODIUM CHLORIDE 0.9% FLUSH: 3.0000 mL | INTRAVENOUS | Status: DC | PRN

## 2016-05-08 MED ORDER — MIDAZOLAM HCL 2 MG/2ML IJ SOLN
INTRAMUSCULAR | Status: AC
  Filled 2016-05-08: qty 4

## 2016-05-08 MED ORDER — ASPIRIN 81 MG PO CHEW
81.0000 mg | CHEWABLE_TABLET | ORAL | Status: AC
  Administered 2016-05-09: 81 mg via ORAL
  Filled 2016-05-08: qty 1

## 2016-05-08 NOTE — Progress Notes (Signed)
ANTICOAGULATION CONSULT NOTE - [employer] for Heparin  Indication: chest pain/ACS, s/p cardiac arrest  Allergies  Allergen Reactions  . Plavix [Clopidogrel Bisulfate] Hives   Patient Measurements: Height: 5\' 5"  (165.1 cm) Weight: 151 lb 11.2 oz (68.8 kg) (RN Held Tele Box ) IBW/kg (Calculated) : 61.5  Vital Signs: Temp: 98.4 F (36.9 C) (01/14 1206) Temp Source: Oral (01/14 1206) BP: 99/67 (01/14 1206) Pulse Rate: 85 (01/14 0355)  Labs:  Recent Labs  05/06/16 0359  05/07/16 0342 05/07/16 0448 05/07/16 1030 05/08/16 0400 05/08/16 1200  HGB 13.9  --   --  14.0  --  13.9  --   HCT 40.9  --   --  40.3  --  40.9  --   PLT 298  --   --  317  --  330  --   HEPARINUNFRC  --   < > 0.64  --   --  0.82* 0.69  CREATININE 1.48*  < >  --  1.49* 1.37* 1.51*  --   < > = values in this interval not displayed.   Medical History: Past Medical History:  Diagnosis Date  . CAD (coronary artery disease) 2009   AMI in 12/2007 with PCI to LAD, staged PCI to  mid/distal RCA, NSTEMI in 02/2009 with BMS to LCx  . HLD (hyperlipidemia)   . HTN (hypertension)   . Ischemic cardiomyopathy    Admitted in 07/2010 with CHF exacerbation   . heparin 1,200 Units/hr (05/08/16 0446)    Assessment: 51 yo M s/p cardiac arrest at outside hospital, on IV heparin for elevated troponin/afib/CAD.  S/p L/RHC - found to have 3V CAD with high grade lesions to RCA and LAD.  Restarted heparin post cath and review films with intervention team plan for  PCI Monday.  Heparin level 0.69 at goal on 1200 units/hr.  No bleeding noted.  At top of range will decrease slightly to prevent accumulation.    Goal of Therapy:  Heparin level 0.3-0.7 units/ml Monitor platelets by anticoagulation protocol: Yes   Plan:  Decrease heparin infusion to 1150 units/hr  Daily heparin level, CBC   Bonnita Nasuti Pharm.D. CPP, BCPS Clinical Pharmacist [phone] 05/08/2016 1:02 PM

## 2016-05-08 NOTE — Progress Notes (Signed)
Patient ID: David [name], male   DOB: [birth year], 51 y.o.   MRN: [id number] P   Advanced Heart Failure Rounding Note  Cardiologist: Dr. Geraldo Pitter (Tia Alert)  Subjective:    He presented to [facility] on 1/5 after ~2 weeks of worsening HF symptoms.  In OSH Ed, he developed symptomatic tachycardia (AFib/AFl) and received rapid IV amiodarone bolus leading to bradycardia and loss of pulse. Required 1-2 rounds of CPR and intubation. He was started on Dopa+Dobutamine. Transferred to [facility] and transitioned from Dopamine to Levophed.  Extubated 05/01/2016.   ECHO 05/03/2015: EF 15-20%. RV mildly dilated.   1/11 LHC showed 3V CAD high grade lesion RCA and LAD. Plan for PCI tomorrow.  IV Lasix last dose on 1/12. Today's CO-OX is 66%. Creatinine stable today 1.18>1.48>1.5>1.49>1.5.  CVP 3.    Denies SOB/CP, has been walking unit.   RHC/LHC 05/06/2016   Prox RCA to Mid RCA lesion, 80 %stenosed.  Mid RCA lesion, 40 %stenosed.  Dist RCA lesion, 20 %stenosed.  Post Atrio lesion, 80 %stenosed.  Ost Cx to Mid Cx lesion, 40 %stenosed.  Mid LAD to Dist LAD lesion, 70 %stenosed.  Dist LAD lesion, 80 %stenosed.  Findings: Ao = 101/75 (87) LV = 102/35 RA =  8 RV =  62/10 PA =  65/34 (47) PCW = 37 (v = 45) Fick cardiac output/index = 3.8/2.1 PVR = 2.9 WU SVR = 1555 FA sat = 96% PA sat = 60%. 61%    Objective:   Weight Range:  Vital Signs:   Temp:  [97.5 F (36.4 C)-98.5 F (36.9 C)] 98.5 F (36.9 C) (01/14 0355) Pulse Rate:  [85-91] 85 (01/14 0355) Resp:  [12-26] 12 (01/14 0355) BP: (80-102)/(56-70) 98/56 (01/14 0355) SpO2:  [95 %-99 %] 97 % (01/14 0355) Last BM Date: 05/07/16  Weight change: Filed Weights   05/05/16 0400 05/06/16 0500 05/07/16 0332  Weight: 155 lb 9.6 oz (70.6 kg) 151 lb 9.6 oz (68.8 kg) 152 lb 8 oz (69.2 kg)    Intake/Output:   Intake/Output Summary (Last 24 hours) at 05/08/16 0759 Last data filed at 05/08/16 0500  Gross per 24 hour    Intake           536.65 ml  Output             1625 ml  Net         -1088.35 ml     Physical Exam: General:  Lying in bed. No resp difficulty HEENT: normal Neck: supple. RIJ TLC. JVP not elevated. Carotids 2+ bilat; no bruits. No lymphadenopathy or thryomegaly appreciated. Cor: PMI laterally displaced. Regular/tachy. No s3 Lungs: clear Abdomen: soft, nontender, nondistended. No hepatosplenomegaly. No bruits or masses. Good bowel sounds. Extremities: no cyanosis, clubbing, rash, edema Neuro: alert & orientedx3, cranial nerves grossly intact. moves all 4 extremities w/o difficulty. Affect pleasant  Telemetry: SR 90s  Labs: Basic Metabolic Panel:  Recent Labs Lab 05/01/16 1420  05/06/16 0359 05/06/16 1240 05/07/16 0448 05/07/16 1030 05/08/16 0400  NA  --   < > 135 135 135 134* 134*  K  --   < > 4.1 4.8 4.0 3.9 3.9  CL  --   < > 98* 96* 99* 97* 100*  CO2  --   < > 25 25 25 24 25   GLUCOSE  --   < > 104* 90 94 146* 99  BUN  --   < > 15 18 21* 20 21*  CREATININE  --   < >  1.48* 1.50* 1.49* 1.37* 1.51*  CALCIUM  --   < > 10.0 10.3 9.6 9.7 9.6  MG 1.7  --   --  2.1  --   --   --   < > = values in this interval not displayed.  Liver Function Tests: No results for input(s): AST, ALT, ALKPHOS, BILITOT, PROT, ALBUMIN in the last 168 hours. No results for input(s): LIPASE, AMYLASE in the last 168 hours. No results for input(s): AMMONIA in the last 168 hours.  CBC:  Recent Labs Lab 05/04/16 0348 05/05/16 0429 05/06/16 0359 05/07/16 0448 05/08/16 0400  WBC 10.9* 10.5 10.9* 12.1* 11.6*  HGB 12.1* 12.7* 13.9 14.0 13.9  HCT 36.2* 38.6* 40.9 40.3 40.9  MCV 81.3 81.4 80.8 81.1 82.1  PLT 231 293 298 317 330    Cardiac Enzymes: No results for input(s): CKTOTAL, CKMB, CKMBINDEX, TROPONINI in the last 168 hours.  BNP: BNP (last 3 results)  Recent Labs  05/03/16 0435  BNP 1,034.8*    ProBNP (last 3 results) No results for input(s): PROBNP in the last 8760  hours.    Other results:  Imaging: No results found.   Medications:     Scheduled Medications: . aspirin EC  81 mg Oral Daily  . digoxin  0.125 mg Oral Daily  . ferumoxytol  510 mg Intravenous Weekly  . isosorbide-hydrALAZINE  0.5 tablet Oral TID  . ivabradine  2.5 mg Oral BID WC  . rosuvastatin  10 mg Oral q1800  . sodium chloride flush  10-40 mL Intracatheter Q12H  . sodium chloride flush  3 mL Intravenous Q12H  . spironolactone  25 mg Oral Daily  . traZODone  100 mg Oral QHS    Infusions: . heparin 1,200 Units/hr (05/08/16 0446)    PRN Medications: sodium chloride, sodium chloride, acetaminophen, ondansetron (ZOFRAN) IV, sodium chloride flush, sodium chloride flush, traMADol   Assessment:   1. Cardiogenic shock 2. PEA arrest 3. Acute on chronic systolic HF   --EF < 29% due to ischemic cardiomyopathy.  4. Acute hypoxemic respiratory failure 5. Elevated troponin 6. PAF 7. CAD    --LHC -->3v CAD with several high grade lesions in RCA and LAD. Plan for PCI next week.  s/p ('09 PCI to LAD, mRCA, '10 PCI to Lcx and '12 mRCA),  8. CKD, stage III 9. Tobacco use    -says he is smoking 1/2 cigarette per day 10. Anemia     --received  Feraheme 05/02/2016. Hgb stable -->12.7 FOBT.  11. Hypokalemia-     -resolved  Plan/Discussion:    Stable this morning.  Remains in NSR.  Looks euvolemic on exam.  CVP 3 and co-ox 66%.  - Hold po Lasix today with low CVP, will need to restart at some point after PCI.    Co-ox stable this morning.  Continue digoxin, ivabradine, spironolactone.  No BP room at this time for ACEI/ARB/Coreg.    Will plan PCI of RCA +/- LAD on Monday. Renal function remaining stable.  - Post-cath, would plan on therapy with ticagrelor + Xarelto 15 (Plavix allergy with hives).  Would not send home on ASA given need for anticoagulation and ticagrelor.  Currently on heparin gtt.   Lorenia Hoston,MD 7:59 AM 05/08/2016

## 2016-05-08 NOTE — Progress Notes (Signed)
ANTICOAGULATION CONSULT NOTE - [place] for Heparin  Indication: chest pain/ACS, s/p cardiac arrest  Allergies  Allergen Reactions  . Plavix [Clopidogrel Bisulfate] Hives   Patient Measurements: Height: 5\' 5"  (165.1 cm) Weight: 152 lb 8 oz (69.2 kg) IBW/kg (Calculated) : 61.5  Vital Signs: Temp: 98.5 F (36.9 C) (01/14 0355) Temp Source: Oral (01/14 0355) BP: 98/56 (01/14 0355) Pulse Rate: 85 (01/14 0016)  Labs:  Recent Labs  05/06/16 0359 05/06/16 0400 05/06/16 1240 05/07/16 0342 05/07/16 0448 05/07/16 1030 05/08/16 0400  HGB 13.9  --   --   --  14.0  --  13.9  HCT 40.9  --   --   --  40.3  --  40.9  PLT 298  --   --   --  317  --  330  HEPARINUNFRC  --  0.46  --  0.64  --   --  0.82*  CREATININE 1.48*  --  1.50*  --  1.49* 1.37*  --      Medical History: Past Medical History:  Diagnosis Date  . CAD (coronary artery disease) 2009   AMI in 12/2007 with PCI to LAD, staged PCI to  mid/distal RCA, NSTEMI in 02/2009 with BMS to LCx  . HLD (hyperlipidemia)   . HTN (hypertension)   . Ischemic cardiomyopathy    Admitted in 07/2010 with CHF exacerbation   . heparin 1,350 Units/hr (05/07/16 2221)    Assessment: 51 yo M s/p cardiac arrest at outside hospital, on IV heparin for elevated troponin/afib/CAD.  S/p L/RHC - found to have 3V CAD with high grade lesions to RCA and LAD, plan to restart heparin post cath and review films with intervention team for possible PCI Monday.  Heparin level slightly above goal on 1350 units/hr.  No bleeding noted.  Will reduce rate accordingly.  Goal of Therapy:  Heparin level 0.3-0.7 units/ml Monitor platelets by anticoagulation protocol: Yes   Plan:  Decrease heparin infusion to 1200 units/hr  Heparin level in 6 hours Daily heparin level, CBC   Janie Strothman, Pharm.D., BCPS Clinical Pharmacist Pager [phone] 05/08/2016 4:45 AM

## 2016-05-09 ENCOUNTER — Encounter (HOSPITAL_COMMUNITY)
Admission: AD | Disposition: A | Discharge status: Home / Self Care | Payer: Self-pay | Source: Other Acute Inpatient Hospital | Attending: Internal Medicine

## 2016-05-09 ENCOUNTER — Encounter (HOSPITAL_COMMUNITY): Payer: Self-pay | Admitting: *Deleted

## 2016-05-09 HISTORY — PX: CARDIAC CATHETERIZATION: SHX172

## 2016-05-09 LAB — BASIC METABOLIC PANEL
Anion gap: 10 (ref 5–15)
BUN: 17 mg/dL (ref 6–20)
CALCIUM: 9.7 mg/dL (ref 8.9–10.3)
CHLORIDE: 102 mmol/L (ref 101–111)
CO2: 24 mmol/L (ref 22–32)
CREATININE: 1.35 mg/dL — AB (ref 0.61–1.24)
GFR calc Af Amer: >60 mL/min (ref >60)
GFR calc non Af Amer: 59 mL/min — ABNORMAL LOW (ref >60)
Glucose, Bld: 87 mg/dL (ref 65–99)
Potassium: 4.3 mmol/L (ref 3.5–5.1)
SODIUM: 136 mmol/L (ref 135–145)

## 2016-05-09 LAB — POCT ACTIVATED CLOTTING TIME
Activated Clotting Time: 257 seconds
Activated Clotting Time: 290 seconds
Activated Clotting Time: 698 seconds

## 2016-05-09 LAB — CBC
HCT: 42.2 % (ref 39.0–52.0)
Hemoglobin: 13.9 g/dL (ref 13.0–17.0)
MCH: 27.5 pg (ref 26.0–34.0)
MCHC: 32.9 g/dL (ref 30.0–36.0)
MCV: 83.4 fL (ref 78.0–100.0)
PLATELETS: 335 10*3/uL (ref 150–400)
RBC: 5.06 MIL/uL (ref 4.22–5.81)
RDW: 16.9 % — AB (ref 11.5–15.5)
WBC: 9.9 10*3/uL (ref 4.0–10.5)

## 2016-05-09 LAB — COOXEMETRY PANEL
Carboxyhemoglobin: 0.9 % (ref 0.5–1.5)
Methemoglobin: 0.9 % (ref 0.0–1.5)
O2 Saturation: 61.5 %
TOTAL HEMOGLOBIN: 14.2 g/dL (ref 12.0–16.0)

## 2016-05-09 LAB — PROTIME-INR
INR: 1.09
Prothrombin Time: 14.1 seconds (ref 11.4–15.2)

## 2016-05-09 LAB — HEPARIN LEVEL (UNFRACTIONATED): HEPARIN UNFRACTIONATED: 0.56 [IU]/mL (ref 0.30–0.70)

## 2016-05-09 SURGERY — CORONARY STENT INTERVENTION
Anesthesia: LOCAL

## 2016-05-09 MED ORDER — IOPAMIDOL (ISOVUE-370) INJECTION 76%
INTRAVENOUS | Status: DC | PRN
  Administered 2016-05-09: 135 mL via INTRAVENOUS

## 2016-05-09 MED ORDER — HEPARIN (PORCINE) IN NACL 2-0.9 UNIT/ML-% IJ SOLN
INTRAMUSCULAR | Status: DC | PRN
  Administered 2016-05-09: 1000 mL

## 2016-05-09 MED ORDER — SODIUM CHLORIDE 0.9% FLUSH: 3.0000 mL | INTRAVENOUS | Status: DC | PRN

## 2016-05-09 MED ORDER — HEPARIN (PORCINE) IN NACL 2-0.9 UNIT/ML-% IJ SOLN
INTRAMUSCULAR | Status: AC
  Filled 2016-05-09: qty 1000

## 2016-05-09 MED ORDER — SODIUM CHLORIDE 0.9 % IV SOLN: INTRAVENOUS | Status: AC

## 2016-05-09 MED ORDER — ADENOSINE 12 MG/4ML IV SOLN
INTRAVENOUS | Status: AC
  Filled 2016-05-09: qty 12

## 2016-05-09 MED ORDER — ASPIRIN 81 MG PO CHEW
81.0000 mg | CHEWABLE_TABLET | Freq: Every day | ORAL | Status: DC
Start: 2016-05-10 — End: 2016-05-13
  Administered 2016-05-10 – 2016-05-13 (×4): 81 mg via ORAL
  Filled 2016-05-09 (×4): qty 1

## 2016-05-09 MED ORDER — FENTANYL CITRATE (PF) 100 MCG/2ML IJ SOLN
INTRAMUSCULAR | Status: DC | PRN
  Administered 2016-05-09: 25 ug via INTRAVENOUS

## 2016-05-09 MED ORDER — LIDOCAINE HCL (PF) 1 % IJ SOLN
INTRAMUSCULAR | Status: AC
  Filled 2016-05-09: qty 30

## 2016-05-09 MED ORDER — FENTANYL CITRATE (PF) 100 MCG/2ML IJ SOLN
INTRAMUSCULAR | Status: AC
  Filled 2016-05-09: qty 2

## 2016-05-09 MED ORDER — IOPAMIDOL (ISOVUE-370) INJECTION 76%
INTRAVENOUS | Status: AC
  Filled 2016-05-09: qty 125

## 2016-05-09 MED ORDER — MIDAZOLAM HCL 2 MG/2ML IJ SOLN
INTRAMUSCULAR | Status: AC
  Filled 2016-05-09: qty 2

## 2016-05-09 MED ORDER — IOPAMIDOL (ISOVUE-370) INJECTION 76%
INTRAVENOUS | Status: AC
  Filled 2016-05-09: qty 50

## 2016-05-09 MED ORDER — SODIUM CHLORIDE 0.9% FLUSH: 3.0000 mL | Freq: Two times a day (BID) | INTRAVENOUS | Status: DC

## 2016-05-09 MED ORDER — MIDAZOLAM HCL 2 MG/2ML IJ SOLN
INTRAMUSCULAR | Status: DC | PRN
  Administered 2016-05-09: 2 mg via INTRAVENOUS

## 2016-05-09 MED ORDER — TICAGRELOR 90 MG PO TABS
ORAL_TABLET | ORAL | Status: DC | PRN
  Administered 2016-05-09: 180 mg via ORAL

## 2016-05-09 MED ORDER — ATROPINE SULFATE 1 MG/10ML IJ SOSY
PREFILLED_SYRINGE | INTRAMUSCULAR | Status: AC
  Filled 2016-05-09: qty 10

## 2016-05-09 MED ORDER — VERAPAMIL HCL 2.5 MG/ML IV SOLN
INTRAVENOUS | Status: DC | PRN
  Administered 2016-05-09: 10 mL via INTRA_ARTERIAL

## 2016-05-09 MED ORDER — SODIUM CHLORIDE 0.9 % IV SOLN: 250.0000 mL | INTRAVENOUS | Status: DC | PRN

## 2016-05-09 MED ORDER — TICAGRELOR 90 MG PO TABS
90.0000 mg | ORAL_TABLET | Freq: Two times a day (BID) | ORAL | Status: DC
  Administered 2016-05-09 – 2016-05-13 (×8): 90 mg via ORAL
  Filled 2016-05-09 (×8): qty 1

## 2016-05-09 MED ORDER — ENOXAPARIN SODIUM 40 MG/0.4ML ~~LOC~~ SOLN
40.0000 mg | SUBCUTANEOUS | Status: DC
  Administered 2016-05-10 – 2016-05-13 (×4): 40 mg via SUBCUTANEOUS
  Filled 2016-05-09 (×4): qty 0.4

## 2016-05-09 MED ORDER — VERAPAMIL HCL 2.5 MG/ML IV SOLN
INTRAVENOUS | Status: AC
  Filled 2016-05-09: qty 2

## 2016-05-09 MED ORDER — HEPARIN SODIUM (PORCINE) 1000 UNIT/ML IJ SOLN
INTRAMUSCULAR | Status: DC | PRN
  Administered 2016-05-09: 2000 [IU] via INTRAVENOUS
  Administered 2016-05-09: 7000 [IU] via INTRAVENOUS

## 2016-05-09 MED ORDER — NITROGLYCERIN 1 MG/10 ML FOR IR/CATH LAB
INTRA_ARTERIAL | Status: AC
  Filled 2016-05-09: qty 10

## 2016-05-09 MED ORDER — HEPARIN SODIUM (PORCINE) 1000 UNIT/ML IJ SOLN
INTRAMUSCULAR | Status: AC
  Filled 2016-05-09: qty 1

## 2016-05-09 MED ORDER — LIDOCAINE HCL (PF) 1 % IJ SOLN
INTRAMUSCULAR | Status: DC | PRN
  Administered 2016-05-09: 3 mL

## 2016-05-09 MED ORDER — TICAGRELOR 90 MG PO TABS
ORAL_TABLET | ORAL | Status: AC
  Filled 2016-05-09: qty 2

## 2016-05-09 SURGICAL SUPPLY — 23 items
BALLN EUPHORA RX 2.5X15 (BALLOONS) ×2
BALLN ~~LOC~~ EUPHORA RX 3.5X27 (BALLOONS) ×2
BALLN ~~LOC~~ EUPHORA RX 4.0X15 (BALLOONS) ×2
BALLOON EUPHORA RX 2.5X15 (BALLOONS) ×1 IMPLANT
BALLOON ~~LOC~~ EUPHORA RX 3.5X27 (BALLOONS) ×1 IMPLANT
BALLOON ~~LOC~~ EUPHORA RX 4.0X15 (BALLOONS) ×1 IMPLANT
CATH MICROCATH NAVVUS (MICROCATHETER) ×1 IMPLANT
CATH VISTA GUIDE 6FR JR4 (CATHETERS) ×2 IMPLANT
CATH VISTA GUIDE 6FR XBLAD3.5 (CATHETERS) ×2 IMPLANT
DEVICE RAD COMP TR BAND LRG (VASCULAR PRODUCTS) ×2 IMPLANT
GLIDESHEATH SLEND SS 6F .021 (SHEATH) ×2 IMPLANT
GUIDEWIRE INQWIRE 1.5J.035X260 (WIRE) ×1 IMPLANT
INQWIRE 1.5J .035X260CM (WIRE) ×2
KIT ENCORE 26 ADVANTAGE (KITS) ×2 IMPLANT
KIT HEART LEFT (KITS) ×2 IMPLANT
MICROCATHETER NAVVUS (MICROCATHETER) ×2
PACK CARDIAC CATHETERIZATION (CUSTOM PROCEDURE TRAY) ×2 IMPLANT
STENT RESOLUTE ONYX 2.75X12 (Permanent Stent) ×2 IMPLANT
STENT RESOLUTE ONYX 3.5X22 (Permanent Stent) ×2 IMPLANT
STENT RESOLUTE ONYX3.0X38 (Permanent Stent) ×2 IMPLANT
TRANSDUCER W/STOPCOCK (MISCELLANEOUS) ×2 IMPLANT
TUBING CIL FLEX 10 FLL-RA (TUBING) ×2 IMPLANT
WIRE ASAHI PROWATER 180CM (WIRE) ×4 IMPLANT

## 2016-05-09 NOTE — Care Management Note (Signed)
Case Management Note  Patient Details  Name: David [name] MRN: [id number] Date of Birth: 01-01-1966  Subjective/Objective:          Adm w chf          Action/Plan: to return home  Expected Discharge Date:                  Expected Discharge Plan:  [place]  In-House Referral:     Discharge planning Services  CM Consult, Medication Assistance  Post Acute Care Choice:    Choice offered to:     DME Arranged:    DME Agency:     HH Arranged:    HH Agency:     Status of Service:  In process, will continue to follow  If discussed at Long Length of Stay Meetings, dates discussed:    Additional Comments: left pt xarelto 30day free card. Left pt brilinta 30day free card. Pt has [employer]  For meds.  Lacretia Leigh, RN 05/09/2016, 10:34 AM

## 2016-05-09 NOTE — H&P (View-Only) (Signed)
Patient ID: David [name], male   DOB: June 01, 1965, 51 y.o.   MRN: [id number] P   Advanced Heart Failure Rounding Note  Cardiologist: Dr. Geraldo Pitter (Tia Alert)  Subjective:    He presented to [facility] on 1/5 after ~2 weeks of worsening HF symptoms.  In OSH Ed, he developed symptomatic tachycardia (AFib/AFl) and received rapid IV amiodarone bolus leading to bradycardia and loss of pulse. Required 1-2 rounds of CPR and intubation. He was started on Dopa+Dobutamine. Transferred to [facility] and transitioned from Dopamine to Levophed.  Extubated 05/01/2016.   ECHO 05/03/2015: EF 15-20%. RV mildly dilated.   1/11 LHC showed 3V CAD high grade lesion RCA and LAD. Plan for PCI tomorrow.  IV Lasix last dose on 1/12. Today's CO-OX is 66%. Creatinine stable today 1.18>1.48>1.5>1.49>1.5.  CVP 3.    Denies SOB/CP, has been walking unit.   RHC/LHC 05/06/2016   Prox RCA to Mid RCA lesion, 80 %stenosed.  Mid RCA lesion, 40 %stenosed.  Dist RCA lesion, 20 %stenosed.  Post Atrio lesion, 80 %stenosed.  Ost Cx to Mid Cx lesion, 40 %stenosed.  Mid LAD to Dist LAD lesion, 70 %stenosed.  Dist LAD lesion, 80 %stenosed.  Findings: Ao = 101/75 (87) LV = 102/35 RA =  8 RV =  62/10 PA =  65/34 (47) PCW = 37 (v = 45) Fick cardiac output/index = 3.8/2.1 PVR = 2.9 WU SVR = 1555 FA sat = 96% PA sat = 60%. 61%    Objective:   Weight Range:  Vital Signs:   Temp:  [97.5 F (36.4 C)-98.5 F (36.9 C)] 98.5 F (36.9 C) (01/14 0355) Pulse Rate:  [85-91] 85 (01/14 0355) Resp:  [12-26] 12 (01/14 0355) BP: (80-102)/(56-70) 98/56 (01/14 0355) SpO2:  [95 %-99 %] 97 % (01/14 0355) Last BM Date: 05/07/16  Weight change: Filed Weights   05/05/16 0400 05/06/16 0500 05/07/16 0332  Weight: 155 lb 9.6 oz (70.6 kg) 151 lb 9.6 oz (68.8 kg) 152 lb 8 oz (69.2 kg)    Intake/Output:   Intake/Output Summary (Last 24 hours) at 05/08/16 0759 Last data filed at 05/08/16 0500  Gross per 24 hour    Intake           536.65 ml  Output             1625 ml  Net         -1088.35 ml     Physical Exam: General:  Lying in bed. No resp difficulty HEENT: normal Neck: supple. RIJ TLC. JVP not elevated. Carotids 2+ bilat; no bruits. No lymphadenopathy or thryomegaly appreciated. Cor: PMI laterally displaced. Regular/tachy. No s3 Lungs: clear Abdomen: soft, nontender, nondistended. No hepatosplenomegaly. No bruits or masses. Good bowel sounds. Extremities: no cyanosis, clubbing, rash, edema Neuro: alert & orientedx3, cranial nerves grossly intact. moves all 4 extremities w/o difficulty. Affect pleasant  Telemetry: SR 90s  Labs: Basic Metabolic Panel:  Recent Labs Lab 05/01/16 1420  05/06/16 0359 05/06/16 1240 05/07/16 0448 05/07/16 1030 05/08/16 0400  NA  --   < > 135 135 135 134* 134*  K  --   < > 4.1 4.8 4.0 3.9 3.9  CL  --   < > 98* 96* 99* 97* 100*  CO2  --   < > 25 25 25 24 25   GLUCOSE  --   < > 104* 90 94 146* 99  BUN  --   < > 15 18 21* 20 21*  CREATININE  --   < >  1.48* 1.50* 1.49* 1.37* 1.51*  CALCIUM  --   < > 10.0 10.3 9.6 9.7 9.6  MG 1.7  --   --  2.1  --   --   --   < > = values in this interval not displayed.  Liver Function Tests: No results for input(s): AST, ALT, ALKPHOS, BILITOT, PROT, ALBUMIN in the last 168 hours. No results for input(s): LIPASE, AMYLASE in the last 168 hours. No results for input(s): AMMONIA in the last 168 hours.  CBC:  Recent Labs Lab 05/04/16 0348 05/05/16 0429 05/06/16 0359 05/07/16 0448 05/08/16 0400  WBC 10.9* 10.5 10.9* 12.1* 11.6*  HGB 12.1* 12.7* 13.9 14.0 13.9  HCT 36.2* 38.6* 40.9 40.3 40.9  MCV 81.3 81.4 80.8 81.1 82.1  PLT 231 293 298 317 330    Cardiac Enzymes: No results for input(s): CKTOTAL, CKMB, CKMBINDEX, TROPONINI in the last 168 hours.  BNP: BNP (last 3 results)  Recent Labs  05/03/16 0435  BNP 1,034.8*    ProBNP (last 3 results) No results for input(s): PROBNP in the last 8760  hours.    Other results:  Imaging: No results found.   Medications:     Scheduled Medications: . aspirin EC  81 mg Oral Daily  . digoxin  0.125 mg Oral Daily  . ferumoxytol  510 mg Intravenous Weekly  . isosorbide-hydrALAZINE  0.5 tablet Oral TID  . ivabradine  2.5 mg Oral BID WC  . rosuvastatin  10 mg Oral q1800  . sodium chloride flush  10-40 mL Intracatheter Q12H  . sodium chloride flush  3 mL Intravenous Q12H  . spironolactone  25 mg Oral Daily  . traZODone  100 mg Oral QHS    Infusions: . heparin 1,200 Units/hr (05/08/16 0446)    PRN Medications: sodium chloride, sodium chloride, acetaminophen, ondansetron (ZOFRAN) IV, sodium chloride flush, sodium chloride flush, traMADol   Assessment:   1. Cardiogenic shock 2. PEA arrest 3. Acute on chronic systolic HF   --EF < 38% due to ischemic cardiomyopathy.  4. Acute hypoxemic respiratory failure 5. Elevated troponin 6. PAF 7. CAD    --LHC -->3v CAD with several high grade lesions in RCA and LAD. Plan for PCI next week.  s/p ('09 PCI to LAD, mRCA, '10 PCI to Lcx and '12 mRCA),  8. CKD, stage III 9. Tobacco use    -says he is smoking 1/2 cigarette per day 10. Anemia     --received  Feraheme 05/02/2016. Hgb stable -->12.7 FOBT.  11. Hypokalemia-     -resolved  Plan/Discussion:    Stable this morning.  Remains in NSR.  Looks euvolemic on exam.  CVP 3 and co-ox 66%.  - Hold po Lasix today with low CVP, will need to restart at some point after PCI.    Co-ox stable this morning.  Continue digoxin, ivabradine, spironolactone.  No BP room at this time for ACEI/ARB/Coreg.    Will plan PCI of RCA +/- LAD on Monday. Renal function remaining stable.  - Post-cath, would plan on therapy with ticagrelor + Xarelto 15 (Plavix allergy with hives).  Would not send home on ASA given need for anticoagulation and ticagrelor.  Currently on heparin gtt.   Doneisha Ivey,MD 7:59 AM 05/08/2016

## 2016-05-09 NOTE — Progress Notes (Signed)
Called cath lab to request assistance with radial TR band as it keeps oozing (level 1) when gets down to 6cc.  Will continue to monitor pt closely.

## 2016-05-09 NOTE — Progress Notes (Signed)
ANTICOAGULATION CONSULT NOTE - [place] for Heparin  Indication: chest pain/ACS, afib  Allergies  Allergen Reactions  . Plavix [Clopidogrel Bisulfate] Hives   Patient Measurements: Height: 5\' 5"  (165.1 cm) Weight: 152 lb 4.8 oz (69.1 kg) IBW/kg (Calculated) : 61.5  Vital Signs: Temp: 98.1 F (36.7 C) (01/15 0737) Temp Source: Oral (01/15 0737) BP: 97/66 (01/15 0737) Pulse Rate: 84 (01/15 0737)  Labs:  Recent Labs  05/07/16 0448 05/07/16 1030 05/08/16 0400 05/08/16 1200 05/09/16 0500  HGB 14.0  --  13.9  --  13.9  HCT 40.3  --  40.9  --  42.2  PLT 317  --  330  --  335  LABPROT  --   --   --   --  14.1  INR  --   --   --   --  1.09  HEPARINUNFRC  --   --  0.82* 0.69 0.56  CREATININE 1.49* 1.37* 1.51*  --  1.35*     Medical History: Past Medical History:  Diagnosis Date  . CAD (coronary artery disease) 2009   AMI in 12/2007 with PCI to LAD, staged PCI to  mid/distal RCA, NSTEMI in 02/2009 with BMS to LCx  . HLD (hyperlipidemia)   . HTN (hypertension)   . Ischemic cardiomyopathy    Admitted in 07/2010 with CHF exacerbation   . sodium chloride 10 mL/hr at 05/09/16 0600  . heparin 1,150 Units/hr (05/08/16 1706)    Assessment: 51 yo M s/p cardiac arrest at outside hospital, on IV heparin for elevated troponin/afib/CAD.  S/p L/RHC - found to have 3V CAD with high grade lesions to RCA and LAD.  Restarted heparin post cath and review films with intervention team plan for  PCI Monday.  Heparin level 0.5 at goal on 1150 units/hr.  No bleeding noted. No issues noted overnight. Plan for PCI today.   Goal of Therapy:  Heparin level 0.3-0.7 units/ml Monitor platelets by anticoagulation protocol: Yes   Plan:  Continue heparin infusion at 1150 units/hr  Daily heparin level, CBC Cath/PCI today  Erin Hearing PharmD., BCPS Clinical Pharmacist Pager [phone] 05/09/2016 8:11 AM

## 2016-05-09 NOTE — Progress Notes (Signed)
Patient ID: David [name], male   DOB: 08/01/1965, 51 y.o.   MRN: [id number] P   Advanced Heart Failure Rounding Note  Cardiologist: Dr. Geraldo Pitter (Tia Alert)  Subjective:    He presented to [facility] on 1/5 after ~2 weeks of worsening HF symptoms.  In OSH Ed, he developed symptomatic tachycardia (AFib/AFl) and received rapid IV amiodarone bolus leading to bradycardia and loss of pulse. Required 1-2 rounds of CPR and intubation. He was started on Dopa+Dobutamine. Transferred to [facility] and transitioned from Dopamine to Levophed.  Extubated 05/01/2016.   ECHO 05/03/2015: EF 15-20%. RV mildly dilated.   1/11 LHC showed 3V CAD high grade lesion RCA and LAD.  IV Lasix last dose on 1/12.   Today's CO-OX is 61.5%.  Creatinine 1.18>1.48>1.5>1.49>1.5>1.35.  CVP 5-6  Denies SOB/CP, has been walking unit. Plan for PCI this afternoon.   RHC/LHC 05/06/2016   Prox RCA to Mid RCA lesion, 80 %stenosed.  Mid RCA lesion, 40 %stenosed.  Dist RCA lesion, 20 %stenosed.  Post Atrio lesion, 80 %stenosed.  Ost Cx to Mid Cx lesion, 40 %stenosed.  Mid LAD to Dist LAD lesion, 70 %stenosed.  Dist LAD lesion, 80 %stenosed.  Findings: Ao = 101/75 (87) LV = 102/35 RA =  8 RV =  62/10 PA =  65/34 (47) PCW = 37 (v = 45) Fick cardiac output/index = 3.8/2.1 PVR = 2.9 WU SVR = 1555 FA sat = 96% PA sat = 60%. 61%    Objective:   Weight Range:  Vital Signs:   Temp:  [97.4 F (36.3 C)-98.4 F (36.9 C)] 98.1 F (36.7 C) (01/15 0737) Pulse Rate:  [80-86] 84 (01/15 0737) Resp:  [15-17] 15 (01/14 2240) BP: (97-103)/(63-69) 97/66 (01/15 0737) SpO2:  [97 %-100 %] 98 % (01/15 0737) Weight:  [152 lb 4.8 oz (69.1 kg)] 152 lb 4.8 oz (69.1 kg) (01/15 0503) Last BM Date: 05/08/16 (pt states has had a BM every day; pt independent to bathroom)  Weight change: Filed Weights   05/07/16 0332 05/08/16 0758 05/09/16 0503  Weight: 152 lb 8 oz (69.2 kg) 151 lb 11.2 oz (68.8 kg) 152 lb 4.8 oz (69.1  kg)    Intake/Output:   Intake/Output Summary (Last 24 hours) at 05/09/16 0759 Last data filed at 05/09/16 0600  Gross per 24 hour  Intake           771.51 ml  Output              250 ml  Net           521.51 ml     Physical Exam: General:  NAD. In bed.  HEENT: Normal Neck: supple. RIJ TLC. JVP not elevated. Carotids 2+ bilat; no bruits. No thyromegaly or nodule noted.  Cor: PMI laterally displaced. Regular/tachy. No s3 Lungs: CTAB, normal effort Abdomen: soft, NT, ND, no HSM. No bruits or masses. +BS  Extremities: no cyanosis, clubbing, rash, edema Neuro: alert & orientedx3, cranial nerves grossly intact. moves all 4 extremities w/o difficulty. Affect pleasant  Telemetry: SR 90s  Labs: Basic Metabolic Panel:  Recent Labs Lab 05/06/16 1240 05/07/16 0448 05/07/16 1030 05/08/16 0400 05/09/16 0500  NA 135 135 134* 134* 136  K 4.8 4.0 3.9 3.9 4.3  CL 96* 99* 97* 100* 102  CO2 25 25 24 25 24   GLUCOSE 90 94 146* 99 87  BUN 18 21* 20 21* 17  CREATININE 1.50* 1.49* 1.37* 1.51* 1.35*  CALCIUM 10.3 9.6 9.7 9.6 9.7  MG 2.1  --   --   --   --     Liver Function Tests: No results for input(s): AST, ALT, ALKPHOS, BILITOT, PROT, ALBUMIN in the last 168 hours. No results for input(s): LIPASE, AMYLASE in the last 168 hours. No results for input(s): AMMONIA in the last 168 hours.  CBC:  Recent Labs Lab 05/05/16 0429 05/06/16 0359 05/07/16 0448 05/08/16 0400 05/09/16 0500  WBC 10.5 10.9* 12.1* 11.6* 9.9  HGB 12.7* 13.9 14.0 13.9 13.9  HCT 38.6* 40.9 40.3 40.9 42.2  MCV 81.4 80.8 81.1 82.1 83.4  PLT 293 298 317 330 335    Cardiac Enzymes: No results for input(s): CKTOTAL, CKMB, CKMBINDEX, TROPONINI in the last 168 hours.  BNP: BNP (last 3 results)  Recent Labs  05/03/16 0435  BNP 1,034.8*    ProBNP (last 3 results) No results for input(s): PROBNP in the last 8760 hours.    Other results:  Imaging: No results found.   Medications:     Scheduled  Medications: . aspirin EC  81 mg Oral Daily  . digoxin  0.125 mg Oral Daily  . ferumoxytol  510 mg Intravenous Weekly  . isosorbide-hydrALAZINE  0.5 tablet Oral TID  . ivabradine  2.5 mg Oral BID WC  . rosuvastatin  10 mg Oral q1800  . sodium chloride flush  10-40 mL Intracatheter Q12H  . sodium chloride flush  3 mL Intravenous Q12H  . sodium chloride flush  3 mL Intravenous Q12H  . spironolactone  25 mg Oral Daily  . traZODone  100 mg Oral QHS    Infusions: . sodium chloride 10 mL/hr at 05/09/16 0600  . heparin 1,150 Units/hr (05/08/16 1706)    PRN Medications: sodium chloride, sodium chloride, sodium chloride, acetaminophen, ondansetron (ZOFRAN) IV, sodium chloride flush, sodium chloride flush, sodium chloride flush, traMADol   Assessment:   1. Cardiogenic shock 2. PEA arrest 3. Acute on chronic systolic HF   --EF < 78% due to ischemic cardiomyopathy.  4. Acute hypoxemic respiratory failure 5. Elevated troponin 6. PAF 7. CAD    --LHC -->3v CAD with several high grade lesions in RCA and LAD. Plan for PCI next week.  s/p ('09 PCI to LAD, mRCA, '10 PCI to Lcx and '12 mRCA),  8. CKD, stage III 9. Tobacco use    -says he is smoking 1/2 cigarette per day 10. Anemia     --received  Feraheme 05/02/2016. Hgb stable -->12.7 FOBT.  11. Hypokalemia-     -resolved  Plan/Discussion:    Stable this morning.  Remains in NSR.  Looks euvolemic on exam.  CVP 3 and co-ox 66%.  Will plan on re-starting po lasix after PCI today.   Co-ox stable. Continue digoxin 0.125 mg daily, ivabradine 2.5 mg BID, and spironolactone 25 mg daily. BP too soft for ACEI/ARB/Coreg currently.  Scheduled PCI of RCA +/- LAD at 1200 today.  Cr stable to improved.   - Post-cath, would plan on therapy with ticagrelor + Eliquis 5 mg BID. (Plavix allergy with hives).  Would not send home on ASA given need for anticoagulation and ticagrelor.  Currently on heparin gtt.   Shirley Friar, PA-C  7:59  AM 05/09/2016   Advanced Heart Failure Team Pager [phone] (M-F; 7a - 4p)  Please contact [facility] for night-coverage after hours (4p -7a ) and weekends on [email or web address]  Patient seen and examined with Oda Kilts, PA-C. We discussed all aspects of the encounter. I agree with the assessment and  plan as stated above.   Hemodynamically stable. Co-ox improved. CVP ok. Creatinine improved. No AF. Holding diuretics for cath.  Underwent stenting of proximal RCA, distal RCA and LAD today. Now on ASA and Brilinta. Will likely continue DAPT for 1 month and then switch to Plavix and Eliquis. Will discuss timing of ICD with EP.  Josiah Nieto,MD 4:55 PM

## 2016-05-09 NOTE — Progress Notes (Signed)
Called to check tr band for oozing after deflation attempts.  Site slightly bruised, no hematoma. TR band deflated and removed from right radial artery. Manual pressure applied for 20 minutes. Site level 0. spo2 98% as measured from right thumb. Tegaderm dressing applied, bedrest instructions given.    Right hand bedrest begins at 17:00:00

## 2016-05-09 NOTE — Progress Notes (Addendum)
Paged for 2 episodes bradycardia into 20s. Symptomatic.   On arrival to room pt HR in 90s and asymptomatic. States he has felt it 4 times since getting back from PCI.    Likely Reperfusion.  Discussed with MD at bedside.  Will keep atropine at bedside and give prn for recurrence.  If recurs, will also start on Dopamine drip.   If occurs on drip, may need temporary pacemaker.   Will follow.  Will consider EP consult as well if recurs.   Legrand Como [address]" [place], PA-C 05/09/2016 2:19 PM   Agree.  Bensimhon, Daniel,MD 4:47 PM

## 2016-05-09 NOTE — Care Management Important Message (Signed)
Important Message  Patient Details  Name: David [name] MRN: [id number] Date of Birth: 30-Mar-1966   Medicare Important Message Given:  Yes    Lacretia Leigh, RN 05/09/2016, 10:33 AM

## 2016-05-09 NOTE — Care Management Note (Signed)
Case Management Note  Patient Details  Name: David [name] MRN: [id number] Date of Birth: 1965-06-02  Subjective/Objective:          Adm w chf          Action/Plan: to return home w fam   Expected Discharge Date:                  Expected Discharge Plan:  [place]  In-House Referral:     Discharge planning Services  CM Consult, Medication Assistance  Post Acute Care Choice:    Choice offered to:     DME Arranged:    DME Agency:     HH Arranged:    HH Agency:     Status of Service:  In process, will continue to follow  If discussed at Long Length of Stay Meetings, dates discussed:    Additional Comments: pt has 242.00 deductible to be met before coveage for meds. Eliquis,xarelto and brilinta are covered under tier 3 after 242.00 deductible met. Ins did not tell us copay for tier 3 drug.  Lacretia Leigh, RN 05/09/2016, 1:43 PM

## 2016-05-09 NOTE — Progress Notes (Signed)
Oda Kilts PA notified of 2 episodes of bradycardia into 20s.  Pt states he feels dizzy when this happens.  Episodes self-resolved with heart rate in 90s.  Will continue to monitor pt closely.

## 2016-05-09 NOTE — Progress Notes (Signed)
1 amp atropine at bedside per Dr. Clayborne Dana verbal order.

## 2016-05-09 NOTE — Interval H&P Note (Signed)
History and Physical Interval Note:  05/09/2016 10:21 AM  David [name]  has presented today for surgery, with the diagnosis of CAD  The various methods of treatment have been discussed with the patient and family. After consideration of risks, benefits and other options for treatment, the patient has consented to  Procedure(s): Coronary Stent Intervention (N/A) as a surgical intervention .  The patient's history has been reviewed, patient examined, no change in status, stable for surgery.  I have reviewed the patient's chart and labs.  Questions were answered to the patient's satisfaction.   Cath Lab Visit (complete for each Cath Lab visit)  Clinical Evaluation Leading to the Procedure:   ACS: Yes.    Non-ACS:    Anginal Classification: CCS III  Anti-ischemic medical therapy: Maximal Therapy (2 or more classes of medications)  Non-Invasive Test Results: No non-invasive testing performed  Prior CABG: No previous CABG        Collier Salina [facility] 05/09/2016 10:21 AM

## 2016-05-09 NOTE — Progress Notes (Signed)
Dr. Haroldine Laws and Oda Kilts at bedside to assess pt.  Orders received.  Will continue to monitor pt closely.

## 2016-05-10 DIAGNOSIS — I255 Ischemic cardiomyopathy: Secondary | ICD-10-CM

## 2016-05-10 LAB — BASIC METABOLIC PANEL
Anion gap: 8 (ref 5–15)
BUN: 13 mg/dL (ref 6–20)
CALCIUM: 9.5 mg/dL (ref 8.9–10.3)
CO2: 23 mmol/L (ref 22–32)
CREATININE: 1.27 mg/dL — AB (ref 0.61–1.24)
Chloride: 104 mmol/L (ref 101–111)
GFR calc non Af Amer: >60 mL/min (ref >60)
Glucose, Bld: 93 mg/dL (ref 65–99)
Potassium: 4.4 mmol/L (ref 3.5–5.1)
SODIUM: 135 mmol/L (ref 135–145)

## 2016-05-10 LAB — COOXEMETRY PANEL
Carboxyhemoglobin: 1.5 % (ref 0.5–1.5)
Methemoglobin: 0.6 % (ref 0.0–1.5)
O2 SAT: 56.2 %
TOTAL HEMOGLOBIN: 14.9 g/dL (ref 12.0–16.0)

## 2016-05-10 LAB — CBC
HCT: 42.7 % (ref 39.0–52.0)
HEMOGLOBIN: 14 g/dL (ref 13.0–17.0)
MCH: 27.4 pg (ref 26.0–34.0)
MCHC: 32.8 g/dL (ref 30.0–36.0)
MCV: 83.6 fL (ref 78.0–100.0)
PLATELETS: 337 10*3/uL (ref 150–400)
RBC: 5.11 MIL/uL (ref 4.22–5.81)
RDW: 16.7 % — AB (ref 11.5–15.5)
WBC: 11.6 10*3/uL — ABNORMAL HIGH (ref 4.0–10.5)

## 2016-05-10 MED ORDER — LOSARTAN POTASSIUM 25 MG PO TABS
12.5000 mg | ORAL_TABLET | Freq: Every day | ORAL | Status: DC
  Administered 2016-05-10: 12.5 mg via ORAL
  Filled 2016-05-10: qty 1

## 2016-05-10 MED ORDER — FUROSEMIDE 80 MG PO TABS
80.0000 mg | ORAL_TABLET | Freq: Every day | ORAL | Status: DC
  Administered 2016-05-10: 80 mg via ORAL
  Filled 2016-05-10: qty 1

## 2016-05-10 MED ORDER — ROSUVASTATIN CALCIUM 20 MG PO TABS
20.0000 mg | ORAL_TABLET | Freq: Every day | ORAL | Status: DC
  Administered 2016-05-10 – 2016-05-12 (×3): 20 mg via ORAL
  Filled 2016-05-10 (×3): qty 1

## 2016-05-10 MED FILL — Adenosine IV Soln 12 MG/4ML: INTRAVENOUS | Qty: 12 | Status: AC

## 2016-05-10 MED FILL — Nitroglycerin IV Soln 100 MCG/ML in D5W: INTRA_ARTERIAL | Qty: 10 | Status: AC

## 2016-05-10 NOTE — Progress Notes (Signed)
CARDIAC REHAB PHASE I   PRE:  Rate/Rhythm: 96 SR  BP:  Sitting: 99/68        SaO2: 98 RA  MODE:  Ambulation: 940 ft   POST:  Rate/Rhythm: 110 ST  BP:  Sitting: 109/85         SaO2: 99 RA  Pt ambulated 940 ft on RA, IV, handheld assist, steady gait, tolerated well with no complaints. Pt states he walked earlier today as well. Completed PCI/stent/CHF education.  Reviewed risk factors, anti-platelet therapy, stent card (unable to locate), activity restrictions, ntg, exercise, heart healthy diet, sodium restrictions, CHF booklet and zone tool, daily weights and phase 2 cardiac rehab. Pt verbalized understanding, receptive, needs reinforcement of education as able. Pt agrees to phase 2 cardiac rehab referral, will send to [place] per pt request. Pt to recliner after walk, call bell within reach. Will follow.  [place], RN, BSN 05/10/2016 3:05 PM

## 2016-05-10 NOTE — Consult Note (Signed)
CARDIOLOGY CONSULT NOTE     Primary Care Physician: No primary care provider on file. Referring Physician:  Admit Date: 04/30/2016  Reason for consultation:  Windle Huebert is a 51 y.o. male with a h/o Ischemic cardiomyopathy status post multiple PCI's, an STEMI, CHF, hypertension, and hyperlipidemia who presented to [facility] in atrial fibrillation. He was given IV amiodarone and subsequently became bradycardic and had a bradycardic arrest requiring CPR. Prior to admission he was having 2 weeks worth of shortness of breath, dyspnea on exertion, and orthopnea. He was transferred to [facility] on dopamine and dobutamine. He had a cardiac catheterization that showed significant coronary disease. He had intervention to his distal LAD, proximal RCA, and PLOM. Postprocedure, he has had 2 episodes of bradycardia with evidence of complete heart block. One of his episodes of bradycardia was associated with a vagal episode as he was trying to urinate. Since that time, he is felt well. He has had no further episodes of bradycardia since his episode at 1:30 yesterday afternoon. He has been walking around without major issues. He has had cardiomyopathy for many years, and has been on optimal medical therapy for greater than 3 months.    Today, he denies symptoms of palpitations, chest pain, shortness of breath, orthopnea, PND, lower extremity edema, dizziness, presyncope, syncope, or neurologic sequela. The patient is tolerating medications without difficulties and is otherwise without complaint today.   Past Medical History:  Diagnosis Date  . CAD (coronary artery disease) 2009   AMI in 12/2007 with PCI to LAD, staged PCI to  mid/distal RCA, NSTEMI in 02/2009 with BMS to LCx  . CHF (congestive heart failure) ([place])   . HLD (hyperlipidemia)   . HTN (hypertension)   . Ischemic cardiomyopathy    Admitted in 07/2010 with CHF exacerbation   Past Surgical History:  Procedure Laterality Date  .  CARDIAC CATHETERIZATION N/A 05/05/2016   Procedure: Right/Left Heart Cath and Coronary Angiography;  Surgeon: Jolaine Artist, MD;  Location: [place] CV LAB;  Service: Cardiovascular;  Laterality: N/A;  . CARDIAC CATHETERIZATION N/A 05/09/2016   Procedure: Coronary Stent Intervention;  Surgeon: Peter M Martinique, MD;  Location: [place] CV LAB;  Service: Cardiovascular;  Laterality: N/A;  . CARDIAC CATHETERIZATION N/A 05/09/2016   Procedure: Intravascular Pressure Wire/FFR Study;  Surgeon: Peter M Martinique, MD;  Location: [place] CV LAB;  Service: Cardiovascular;  Laterality: N/A;  . LEFT HEART CATHETERIZATION WITH CORONARY ANGIOGRAM N/A 03/13/2011   Procedure: LEFT HEART CATHETERIZATION WITH CORONARY ANGIOGRAM;  Surgeon: Lorretta Harp, MD;  Location: [facility] CATH LAB;  Service: Cardiovascular;  Laterality: N/A;    . aspirin  81 mg Oral Daily  . digoxin  0.125 mg Oral Daily  . enoxaparin (LOVENOX) injection  40 mg Subcutaneous Q24H  . furosemide  80 mg Oral Daily  . isosorbide-hydrALAZINE  0.5 tablet Oral TID  . ivabradine  2.5 mg Oral BID WC  . losartan  12.5 mg Oral QHS  . rosuvastatin  20 mg Oral q1800  . sodium chloride flush  10-40 mL Intracatheter Q12H  . spironolactone  25 mg Oral Daily  . ticagrelor  90 mg Oral BID  . traZODone  100 mg Oral QHS     Allergies  Allergen Reactions  . Plavix [Clopidogrel Bisulfate] Hives    Social History   Social History  . Marital status: Married    Spouse name: N/A  . Number of children: N/A  . Years of education: N/A  Occupational History  . Not on file.   Social History Main Topics  . Smoking status: Current Every Day Smoker    Packs/day: 0.20    Years: 30.00    Types: Cigars  . Smokeless tobacco: Never Used     Comment: Used to smoke cigarettes. Pt is currently smoking 3 black & mild cigars per day. He has cut down but never quit.  . Alcohol use No  . Drug use: No  . Sexual activity: Not on file   Other Topics  Concern  . Not on file   Social History Narrative  . No narrative on file    Family History  Problem Relation Age of Onset  . Coronary artery disease Father   . Hypertension Father   . Hypertension Sister   . Coronary artery disease Brother   . Emphysema Brother   . Coronary artery disease Mother   . Hypertension Mother   . Emphysema Mother   . Hypertension Daughter   . Obesity Daughter   . Diabetes    . Hypertension    . Coronary artery disease      ROS- All systems are reviewed and negative except as per the HPI above  Physical Exam: Telemetry: Vitals:   05/10/16 0750 05/10/16 0800 05/10/16 1100 05/10/16 1200  BP: 110/81  90/64   Pulse:   87   Resp: 14 (!) 29 11 20   Temp:   97.8 F (36.6 C)   TempSrc:   Oral   SpO2:      Weight:      Height:        GEN- The patient is well appearing, alert and oriented x 3 today.   Head- normocephalic, atraumatic Eyes-  Sclera clear, conjunctiva pink Ears- hearing intact Oropharynx- clear Neck- supple, no JVP Lymph- no cervical lymphadenopathy Lungs- Clear to ausculation bilaterally, normal work of breathing Heart- Regular rate and rhythm, no murmurs, rubs or gallops, PMI not laterally displaced GI- soft, NT, ND, + BS Extremities- no clubbing, cyanosis, or edema MS- no significant deformity or atrophy Skin- no rash or lesion Psych- euthymic mood, full affect Neuro- strength and sensation are intact  EKG:  Labs:   Lab Results  Component Value Date   WBC 11.6 (H) 05/10/2016   HGB 14.0 05/10/2016   HCT 42.7 05/10/2016   MCV 83.6 05/10/2016   PLT 337 05/10/2016    Recent Labs Lab 05/10/16 0533  NA 135  K 4.4  CL 104  CO2 23  BUN 13  CREATININE 1.27*  CALCIUM 9.5  GLUCOSE 93   Lab Results  Component Value Date   CKTOTAL 1,559 (H) 03/14/2011   CKMB 111.9 (HH) 03/14/2011   TROPONINI 1.62 (HH) 05/01/2016    Lab Results  Component Value Date   CHOL (H) 01/20/2008    267        ATP III  CLASSIFICATION:  <200     mg/dL   Desirable  200-239  mg/dL   Borderline High  >=240    mg/dL   High   Lab Results  Component Value Date   HDL 29 (L) 01/20/2008   Lab Results  Component Value Date   LDLCALC (H) 01/20/2008    173        Total Cholesterol/HDL:CHD Risk Coronary Heart Disease Risk Table                     Men   Women  1/2 Average Risk   3.4  3.3   Lab Results  Component Value Date   TRIG 323 (H) 01/20/2008   Lab Results  Component Value Date   CHOLHDL 9.2 01/20/2008   No results found for: LDLDIRECT    Radiology: 1. Support equipment appears satisfactorily positioned. New right IJ line extends into the low SVC. 2. No pneumothorax. 3. Stable cardiomegaly and mild basilar airspace opacities.  Echo: - Left ventricle: LVEF is severely depressed at 15 to 20% with   diffuse hypokinesis; inferior and posterior akinesis. The cavity   size was moderately dilated. Wall thickness was normal. - Aortic valve: There was trivial regurgitation. - Mitral valve: There was moderate regurgitation. - Left atrium: The atrium was moderately dilated. - Right ventricle: The cavity size was mildly dilated. - Right atrium: The atrium was mildly dilated.  Cardiac cath:  Prox RCA to Mid RCA lesion, 80 %stenosed.  Mid RCA lesion, 40 %stenosed.  Dist RCA lesion, 20 %stenosed.  Post Atrio lesion, 80 %stenosed.  Ost Cx to Mid Cx lesion, 40 %stenosed.  Mid LAD to Dist LAD lesion, 70 %stenosed.  Dist LAD lesion, 80 %stenosed.   Dist LAD lesion, 80 %stenosed.  Post intervention, there is a 0% residual stenosis.  Prox RCA to Mid RCA lesion, 80 %stenosed.  A STENT RESOLUTE ONYX [id number] drug eluting stent was successfully placed, and overlaps previously placed stent.  Post intervention, there is a 0% residual stenosis.  Post Atrio lesion, 80 %stenosed.  A STENT RESOLUTE ONYX [id number] drug eluting stent was successfully placed.  Mid LAD to Dist LAD lesion, 70  %stenosed.  Post intervention, there is a 0% residual stenosis.  A STENT RESOLUTE [id number] drug eluting stent was successfully placed, and overlaps previously placed stent.   1. Successful stenting of the proximal to mid RCA and PLOM with DES x 2 2. Successful stenting of the mid LAD with DES   ASSESSMENT AND PLAN:   51 year old male history of ischemic cardiomyopathy presented to the hospital with shortness of breath and fatigue found to be in atrial fibrillation who had a bradycardic arrest after IV amiodarone.  1. Acute heart failure exacerbation due to Ischemic cardiomyopathy, EF 15-20%: Status post multiple stents this admission. He has had episodes of bradycardia, but they appear to be more vagally mediated at this point. I discussed with him the option of defibrillator therapy versus medical therapy for his heart failure. He does have a wide QRS complex and therefore would qualify for CRT-D. As he recently had an intervention and is on ticagrelor, would prefer to hold off on ICD therapy at this time. Would give him at least a month of triple therapy, and stop his aspirin with defibrillator placement at that time likely off of anticoagulation as well to prevent bleeding complications.  2.  Paroxysmal atrial fibrillation: Has had one episode of atrial fibrillation at this point. He does have an elevated risk of stroke. We'll plan to start him on anticoagulation. Would hold off on rate control if possible as he has had episodes of bradycardia. If necessary, would restart Coreg at a low dose.  This patients CHA2DS2-VASc Score and unadjusted Ischemic Stroke Rate (% per year) is equal to 3.2 % stroke rate/year from a score of 3  Above score calculated as 1 point each if present [CHF, HTN, DM, Vascular=MI/PAD/Aortic Plaque, Age if 65-74, or Male] Above score calculated as 2 points each if present [Age > 75, or Stroke/TIA/TE]   Fredric Slabach Meredith Leeds, MD 05/10/2016  2:53 PM

## 2016-05-10 NOTE — Progress Notes (Signed)
Patient ID: David [name], male   DOB: 1966-01-27, 51 y.o.   MRN: [id number] P   Advanced Heart Failure Rounding Note  Cardiologist: Dr. Geraldo Pitter [facility])  Subjective:    Extubated 05/01/2016.   ECHO 05/03/2015: EF 15-20%. RV mildly dilated.   1/11 LHC showed 3V CAD high grade lesion RCA and LAD.  IV Lasix last dose on 1/12.   No CO-OX yet this am. Creatinine stable to improved. CVP not connected  S/p PCI with successful stenting of the proximal to mid RCA and PLOM with DES x 2. Successful stenting of the mid LAD with DES. 05/09/16  Had several episodes of bradycardia after PCI, symptomatic, with rates down into the 20s. Improved without intervention.   No further bradycardia episodes. Feeling much better today. Denies SOB. No CP.   RHC/LHC 05/06/2016   Prox RCA to Mid RCA lesion, 80 %stenosed.  Mid RCA lesion, 40 %stenosed.  Dist RCA lesion, 20 %stenosed.  Post Atrio lesion, 80 %stenosed.  Ost Cx to Mid Cx lesion, 40 %stenosed.  Mid LAD to Dist LAD lesion, 70 %stenosed.  Dist LAD lesion, 80 %stenosed.  Findings: Ao = 101/75 (87) LV = 102/35 RA =  8 RV =  62/10 PA =  65/34 (47) PCW = 37 (v = 45) Fick cardiac output/index = 3.8/2.1 PVR = 2.9 WU SVR = 1555 FA sat = 96% PA sat = 60%. 61%    Objective:   Weight Range:  Vital Signs:   Temp:  [97.7 F (36.5 C)-98.3 F (36.8 C)] 97.7 F (36.5 C) (01/16 0446) Pulse Rate:  [0-103] 94 (01/16 0446) Resp:  [0-27] 14 (01/16 0700) BP: (90-141)/(61-105) 110/81 (01/16 0700) SpO2:  [0 %-99 %] 96 % (01/16 0446) Weight:  [152 lb (68.9 kg)] 152 lb (68.9 kg) (01/16 0446) Last BM Date: 05/09/16  Weight change: Filed Weights   05/08/16 0758 05/09/16 0503 05/10/16 0446  Weight: 151 lb 11.2 oz (68.8 kg) 152 lb 4.8 oz (69.1 kg) 152 lb (68.9 kg)    Intake/Output:   Intake/Output Summary (Last 24 hours) at 05/10/16 0800 Last data filed at 05/10/16 0600  Gross per 24 hour  Intake           1517.5 ml  Output              1200 ml  Net            317.5 ml     Physical Exam: General:  Seated in recliner. NAD.  HEENT: Normal Neck: supple. RIJ TLC. JVP not elevated. Carotids 2+ bilat; no bruits. No thyromegaly or lymphadenopathy noted.  Cor: PMI laterally displaced. RRR. No M/G/R noted.  Lungs: Clear, normal effort Abdomen: soft, NT, ND, no HSM. No bruits or masses. +BS  Extremities: no cyanosis, clubbing, rash. No peripheral edema.  Neuro: alert & orientedx3, cranial nerves grossly intact. moves all 4 extremities w/o difficulty. Affect pleasant  Telemetry: Reviewed, NSR 90s, no further episodes of bradycardia since yesterday afternoon.    Labs: Basic Metabolic Panel:  Recent Labs Lab 05/06/16 1240 05/07/16 0448 05/07/16 1030 05/08/16 0400 05/09/16 0500 05/10/16 0533  NA 135 135 134* 134* 136 135  K 4.8 4.0 3.9 3.9 4.3 4.4  CL 96* 99* 97* 100* 102 104  CO2 25 25 24 25 24 23   GLUCOSE 90 94 146* 99 87 93  BUN 18 21* 20 21* 17 13  CREATININE 1.50* 1.49* 1.37* 1.51* 1.35* 1.27*  CALCIUM 10.3 9.6 9.7 9.6 9.7 9.5  MG  2.1  --   --   --   --   --     Liver Function Tests: No results for input(s): AST, ALT, ALKPHOS, BILITOT, PROT, ALBUMIN in the last 168 hours. No results for input(s): LIPASE, AMYLASE in the last 168 hours. No results for input(s): AMMONIA in the last 168 hours.  CBC:  Recent Labs Lab 05/06/16 0359 05/07/16 0448 05/08/16 0400 05/09/16 0500 05/10/16 0533  WBC 10.9* 12.1* 11.6* 9.9 11.6*  HGB 13.9 14.0 13.9 13.9 14.0  HCT 40.9 40.3 40.9 42.2 42.7  MCV 80.8 81.1 82.1 83.4 83.6  PLT 298 317 330 335 337    Cardiac Enzymes: No results for input(s): CKTOTAL, CKMB, CKMBINDEX, TROPONINI in the last 168 hours.  BNP: BNP (last 3 results)  Recent Labs  05/03/16 0435  BNP 1,034.8*    ProBNP (last 3 results) No results for input(s): PROBNP in the last 8760 hours.    Other results:  Imaging: No results found.   Medications:     Scheduled Medications: .  aspirin  81 mg Oral Daily  . digoxin  0.125 mg Oral Daily  . enoxaparin (LOVENOX) injection  40 mg Subcutaneous Q24H  . isosorbide-hydrALAZINE  0.5 tablet Oral TID  . ivabradine  2.5 mg Oral BID WC  . rosuvastatin  10 mg Oral q1800  . sodium chloride flush  10-40 mL Intracatheter Q12H  . spironolactone  25 mg Oral Daily  . ticagrelor  90 mg Oral BID  . traZODone  100 mg Oral QHS    Infusions:   PRN Medications: sodium chloride, acetaminophen, ondansetron (ZOFRAN) IV, sodium chloride flush, traMADol   Assessment:   1. Cardiogenic shock 2. PEA arrest 3. Acute on chronic systolic HF   - Echo 06/03/50 LVEF 15-20% due to ischemic cardiomyopathy.  4. Acute hypoxemic respiratory failure 5. Elevated troponin 6. PAF 7. CAD    --LHC -->3v CAD with several high grade lesions in RCA and LAD. Plan for PCI next week.  s/p ('09 PCI to LAD, mRCA, '10 PCI to Lcx and '12 mRCA),  8. CKD, stage III 9. Tobacco use    -says he is smoking 1/2 cigarette per day 10. Anemia     --received  Feraheme 05/02/2016. Hgb stable -->12.7 FOBT.  11. Hypokalemia-     -resolved  Plan/Discussion:    Feeling good this morning. Remains in NSR. No further bradycardia.   CVP disconnected and coox pending. Will resume home po lasix at 80 mg daily.   Continue digoxin 0.125 mg daily, ivabradine 2.5 mg BID, and spironolactone 25 mg daily.  Add 12.5 mg losartan qhs this evening. Would use low dose losartan and can eventually transition to [facility] as tolerated.  Would avoid BB for now with episodes of bradycardia.   S/p PCI with successful stenting of the proximal to mid RCA and PLOM with DES x 2. Successful stenting of the mid LAD with DES. 05/09/16.   Creatinine stable.   Currently on Ticagrelor and ASA.  Dr. Haroldine Laws to discuss with Interventionalist if pt will need to start Eliquis right away, or if DAPT alone for 1 month would be ideal.   Watch for one more day with medication adjustments and bradycardia  05/09/16. Will have EP see to discuss ICD timing.   Possibly home tomorrow, unless decide to do ICD this admission.   Shirley Friar, PA-C  8:00 AM 05/10/2016   Advanced Heart Failure Team Pager [phone] (M-F; 7a - 4p)  Please contact [facility] for night-coverage after hours (4p -7a ) and weekends on [email or web address]  Patient seen and examined with Oda Kilts, PA-C. We discussed all aspects of the encounter. I agree with the assessment and plan as stated above.   More stable today. No further bradycardic episodes. CVP looks about 5. Co-ox ok.   Will continue Brillinta and ASA now. Hold DOAC for now. Can drop ASA in 30 days and add Eliquis. He has hives with Plavix.  EP to see to consider timing of CRT-D. I have d/w them personally.   Agree with losartan.   Still several issues to sort out but hopefully home by end of week.   Glori Bickers, MD

## 2016-05-11 LAB — CBC
HCT: 41.6 % (ref 39.0–52.0)
Hemoglobin: 13.5 g/dL (ref 13.0–17.0)
MCH: 27.2 pg (ref 26.0–34.0)
MCHC: 32.5 g/dL (ref 30.0–36.0)
MCV: 83.9 fL (ref 78.0–100.0)
PLATELETS: 305 10*3/uL (ref 150–400)
RBC: 4.96 MIL/uL (ref 4.22–5.81)
RDW: 16.9 % — AB (ref 11.5–15.5)
WBC: 10.7 10*3/uL — AB (ref 4.0–10.5)

## 2016-05-11 LAB — BASIC METABOLIC PANEL
ANION GAP: 11 (ref 5–15)
BUN: 14 mg/dL (ref 6–20)
CALCIUM: 9.6 mg/dL (ref 8.9–10.3)
CO2: 20 mmol/L — ABNORMAL LOW (ref 22–32)
Chloride: 104 mmol/L (ref 101–111)
Creatinine, Ser: 1.48 mg/dL — ABNORMAL HIGH (ref 0.61–1.24)
GFR, EST NON AFRICAN AMERICAN: 53 mL/min — AB (ref >60)
Glucose, Bld: 103 mg/dL — ABNORMAL HIGH (ref 65–99)
Potassium: 4.2 mmol/L (ref 3.5–5.1)
SODIUM: 135 mmol/L (ref 135–145)

## 2016-05-11 NOTE — Progress Notes (Signed)
Patient ID: David [name], male   DOB: [birth year], 51 y.o.   MRN: [id number] P   Advanced Heart Failure Rounding Note  Cardiologist: Dr. Geraldo Pitter [facility])  Subjective:    Extubated 05/01/2016.   ECHO 05/03/2015: EF 15-20%. RV mildly dilated.   --1/11 LHC showed 3V CAD high grade lesion RCA and LAD.  IV Lasix last dose on 1/12. --S/p PCI with successful stenting of the proximal to mid RCA and PLOM with DES x 2. Successful stenting of the mid LAD with DES. 05/09/16. Had several episodes of bradycardia after PCI, symptomatic, with rates down into the 20s. Improved without intervention.     Feels good today. CVP 1. Low-dose losartan started but SBP in 80s. Denies dizziness. I discussed with EP. Would like to wait for CRT-D placement until 30 days after PCI.     RHC/LHC 05/06/2016   Prox RCA to Mid RCA lesion, 80 %stenosed.  Mid RCA lesion, 40 %stenosed.  Dist RCA lesion, 20 %stenosed.  Post Atrio lesion, 80 %stenosed.  Ost Cx to Mid Cx lesion, 40 %stenosed.  Mid LAD to Dist LAD lesion, 70 %stenosed.  Dist LAD lesion, 80 %stenosed.  Findings: Ao = 101/75 (87) LV = 102/35 RA =  8 RV =  62/10 PA =  65/34 (47) PCW = 37 (v = 45) Fick cardiac output/index = 3.8/2.1 PVR = 2.9 WU SVR = 1555 FA sat = 96% PA sat = 60%. 61%    Objective:   Weight Range:  Vital Signs:   Temp:  [97.4 F (36.3 C)-98.6 F (37 C)] 97.4 F (36.3 C) (01/17 0800) Resp:  [14-27] 20 (01/17 0700) BP: (74-105)/(46-80) 86/64 (01/17 0700) SpO2:  [95 %] 95 % (01/17 0800) Weight:  [68.4 kg (150 lb 14.4 oz)] 68.4 kg (150 lb 14.4 oz) (01/17 0500) Last BM Date: 05/10/16  Weight change: Filed Weights   05/09/16 0503 05/10/16 0446 05/11/16 0500  Weight: 69.1 kg (152 lb 4.8 oz) 68.9 kg (152 lb) 68.4 kg (150 lb 14.4 oz)    Intake/Output:   Intake/Output Summary (Last 24 hours) at 05/11/16 1108 Last data filed at 05/11/16 0800  Gross per 24 hour  Intake              360 ml  Output               250 ml  Net              110 ml     Physical Exam: CVP 1 General: Lying in bed . NAD.  HEENT: Normal Neck: supple. RIJ TLC. JVP flatCarotids 2+ bilat; no bruits. No thyromegaly or lymphadenopathy noted.  Cor: PMI laterally displaced. RRR. No M/G/R noted.  Lungs: Clear, normal effort Abdomen: soft, NT, ND, no HSM. No bruits or masses. +BS  Extremities: no cyanosis, clubbing, rash. No peripheral edema.  Neuro: alert & orientedx3, cranial nerves grossly intact. moves all 4 extremities w/o difficulty. Affect pleasant  Telemetry: Reviewed, NSR 90s, no further episodes of bradycardia  Labs: Basic Metabolic Panel:  Recent Labs Lab 05/06/16 1240  05/07/16 1030 05/08/16 0400 05/09/16 0500 05/10/16 0533 05/11/16 0500  NA 135  < > 134* 134* 136 135 135  K 4.8  < > 3.9 3.9 4.3 4.4 4.2  CL 96*  < > 97* 100* 102 104 104  CO2 25  < > 24 25 24 23  20*  GLUCOSE 90  < > 146* 99 87 93 103*  BUN 18  < > 20  21* 17 13 14   CREATININE 1.50*  < > 1.37* 1.51* 1.35* 1.27* 1.48*  CALCIUM 10.3  < > 9.7 9.6 9.7 9.5 9.6  MG 2.1  --   --   --   --   --   --   < > = values in this interval not displayed.  Liver Function Tests: No results for input(s): AST, ALT, ALKPHOS, BILITOT, PROT, ALBUMIN in the last 168 hours. No results for input(s): LIPASE, AMYLASE in the last 168 hours. No results for input(s): AMMONIA in the last 168 hours.  CBC:  Recent Labs Lab 05/07/16 0448 05/08/16 0400 05/09/16 0500 05/10/16 0533 05/11/16 0500  WBC 12.1* 11.6* 9.9 11.6* 10.7*  HGB 14.0 13.9 13.9 14.0 13.5  HCT 40.3 40.9 42.2 42.7 41.6  MCV 81.1 82.1 83.4 83.6 83.9  PLT 317 330 335 337 305    Cardiac Enzymes: No results for input(s): CKTOTAL, CKMB, CKMBINDEX, TROPONINI in the last 168 hours.  BNP: BNP (last 3 results)  Recent Labs  05/03/16 0435  BNP 1,034.8*    ProBNP (last 3 results) No results for input(s): PROBNP in the last 8760 hours.    Other results:  Imaging: No results  found.   Medications:     Scheduled Medications: . aspirin  81 mg Oral Daily  . digoxin  0.125 mg Oral Daily  . enoxaparin (LOVENOX) injection  40 mg Subcutaneous Q24H  . isosorbide-hydrALAZINE  0.5 tablet Oral TID  . ivabradine  2.5 mg Oral BID WC  . rosuvastatin  20 mg Oral q1800  . sodium chloride flush  10-40 mL Intracatheter Q12H  . spironolactone  25 mg Oral Daily  . ticagrelor  90 mg Oral BID  . traZODone  100 mg Oral QHS    Infusions:   PRN Medications: sodium chloride, acetaminophen, ondansetron (ZOFRAN) IV, sodium chloride flush, traMADol   Assessment:   1. Cardiogenic shock 2. PEA arrest 3. Acute on chronic systolic HF   - Echo 1/0/93 LVEF 15-20% due to ischemic cardiomyopathy.  4. Acute hypoxemic respiratory failure 5. Elevated troponin 6. PAF 7. CAD    --LHC -->3v CAD with several high grade lesions in RCA and LAD. Plan for PCI next week.  s/p ('09 PCI to LAD, mRCA, '10 PCI to Lcx and '12 mRCA),  8. CKD, stage III 9. Tobacco use    -says he is smoking 1/2 cigarette per day 10. Anemia     --received  Feraheme 05/02/2016. Hgb stable -->12.7 FOBT.  11. Hypokalemia-     -resolved  Plan/Discussion:    More stable today. No further bradycardic episodes. CVP looks about 1. BP low. Hold lasix. Stop losartan. Continue Bidil. Would not use carvedilol yet. Ok to continue low-dose Corlanor  Will continue [place] and ASA now. Hold DOAC for now. Can drop ASA in 30 days and add Eliquis. He has hives with Plavix.  EP has seen  Would like to wait for CRT-D placement until 30 days after PCI.   Possibly home in am. Continue to ambulate.   Glori Bickers, MD  11:08 AM 05/11/2016   Advanced Heart Failure Team Pager [phone] (M-F; [place])  Please contact [facility] for night-coverage after hours (4p -7a ) and weekends on [email or web address]

## 2016-05-11 NOTE — Progress Notes (Signed)
CARDIAC REHAB PHASE I   PRE:  Rate/Rhythm: 94 SR  BP:  Supine:   Sitting: 96/66  Standing:    SaO2: 100%RA  MODE:  Ambulation: 1410 ft   POST:  Rate/Rhythm: 93 SR  BP:  Supine: 86/58  Took three times and all BPs low  Sitting:   Standing:    SaO2: 100%RA 1400-1445 Pt walked 1410 ft pushing IV pole with steady gait. Tolerated well. BP lower after walk but pt not symptomatic. Felt fine. Re enforced some CHF teaching. Encouraged him to weigh daily and watch sodium. Pt knew he was on brilinta for his stent.   Graylon Good, RN BSN  05/11/2016 2:38 PM

## 2016-05-12 DIAGNOSIS — I214 Non-ST elevation (NSTEMI) myocardial infarction: Secondary | ICD-10-CM

## 2016-05-12 LAB — BASIC METABOLIC PANEL
Anion gap: 9 (ref 5–15)
BUN: 14 mg/dL (ref 6–20)
CHLORIDE: 105 mmol/L (ref 101–111)
CO2: 22 mmol/L (ref 22–32)
Calcium: 9.4 mg/dL (ref 8.9–10.3)
Creatinine, Ser: 1.44 mg/dL — ABNORMAL HIGH (ref 0.61–1.24)
GFR, EST NON AFRICAN AMERICAN: 54 mL/min — AB (ref >60)
GLUCOSE: 95 mg/dL (ref 65–99)
Potassium: 4.1 mmol/L (ref 3.5–5.1)
SODIUM: 136 mmol/L (ref 135–145)

## 2016-05-12 LAB — CBC
HEMATOCRIT: 39.8 % (ref 39.0–52.0)
HEMOGLOBIN: 12.9 g/dL — AB (ref 13.0–17.0)
MCH: 27.2 pg (ref 26.0–34.0)
MCHC: 32.4 g/dL (ref 30.0–36.0)
MCV: 83.8 fL (ref 78.0–100.0)
Platelets: 330 10*3/uL (ref 150–400)
RBC: 4.75 MIL/uL (ref 4.22–5.81)
RDW: 16.7 % — ABNORMAL HIGH (ref 11.5–15.5)
WBC: 11.1 10*3/uL — AB (ref 4.0–10.5)

## 2016-05-12 LAB — COOXEMETRY PANEL
CARBOXYHEMOGLOBIN: 0.9 % (ref 0.5–1.5)
METHEMOGLOBIN: 0.9 % (ref 0.0–1.5)
O2 SAT: 53.9 %
TOTAL HEMOGLOBIN: 13.3 g/dL (ref 12.0–16.0)

## 2016-05-12 NOTE — Care Management Important Message (Signed)
Important Message  Patient Details  Name: David [name] MRN: [id number] Date of Birth: [birth year]   Medicare Important Message Given:  Yes    Lacretia Leigh, RN 05/12/2016, 10:19 AM

## 2016-05-12 NOTE — Progress Notes (Signed)
Patient ID: David [name], male   DOB: 1965/05/10, 51 y.o.   MRN: [id number] P   Advanced Heart Failure Rounding Note  Cardiologist: Dr. Geraldo Pitter [facility])  Subjective:    Extubated 05/01/2016.   ECHO 05/03/2015: EF 15-20%. RV mildly dilated.   --1/11 LHC showed 3V CAD high grade lesion RCA and LAD.  IV Lasix last dose on 1/12. --S/p PCI with successful stenting of the proximal to mid RCA and PLOM with DES x 2. Successful stenting of the mid LAD with DES. 05/09/16. Had several episodes of bradycardia after PCI, symptomatic, with rates down into the 20s. Improved without intervention.   Today he has no complaints. Feeling much better. Denies SOB/CP. Last night had an episode of dizziness. SBP in 70s.  CVP 4. CO-OX 54%.    RHC/LHC 05/06/2016   Prox RCA to Mid RCA lesion, 80 %stenosed.  Mid RCA lesion, 40 %stenosed.  Dist RCA lesion, 20 %stenosed.  Post Atrio lesion, 80 %stenosed.  Ost Cx to Mid Cx lesion, 40 %stenosed.  Mid LAD to Dist LAD lesion, 70 %stenosed.  Dist LAD lesion, 80 %stenosed.  Findings: Ao = 101/75 (87) LV = 102/35 RA =  8 RV =  62/10 PA =  65/34 (47) PCW = 37 (v = 45) Fick cardiac output/index = 3.8/2.1 PVR = 2.9 WU SVR = 1555 FA sat = 96% PA sat = 60%. 61%    Objective:   Weight Range:  Vital Signs:   Temp:  [97.5 F (36.4 C)-97.9 F (36.6 C)] 97.8 F (36.6 C) (01/18 0400) Pulse Rate:  [77-87] 87 (01/17 1625) Resp:  [14-24] 21 (01/18 0400) BP: (73-103)/(45-61) 98/54 (01/18 0400) SpO2:  [95 %-100 %] 100 % (01/17 1625) Weight:  [151 lb 9.6 oz (68.8 kg)] 151 lb 9.6 oz (68.8 kg) (01/18 0400) Last BM Date: 05/11/16  Weight change: Filed Weights   05/10/16 0446 05/11/16 0500 05/12/16 0400  Weight: 152 lb (68.9 kg) 150 lb 14.4 oz (68.4 kg) 151 lb 9.6 oz (68.8 kg)    Intake/Output:   Intake/Output Summary (Last 24 hours) at 05/12/16 0807 Last data filed at 05/12/16 0500  Gross per 24 hour  Intake              870 ml  Output               775 ml  Net               95 ml     Physical Exam: CVP 4 General: In the chair . NAD.  HEENT: Normal Neck: supple. RIJ TLC. JVP flat. Carotids 2+ bilat; no bruits. No thyromegaly or lymphadenopathy noted.  Cor: PMI laterally displaced. RRR. No M/G/R noted.  Lungs: Clear, normal effort Abdomen: soft, NT, ND, no HSM. No bruits or masses. +BS  Extremities: no cyanosis, clubbing, rash. No peripheral edema.  Neuro: alert & orientedx3, cranial nerves grossly intact. moves all 4 extremities w/o difficulty. Affect pleasant  Telemetry: Reviewed, NSR 80-90s, NSVT  Labs: Basic Metabolic Panel:  Recent Labs Lab 05/06/16 1240  05/08/16 0400 05/09/16 0500 05/10/16 0533 05/11/16 0500 05/12/16 0439  NA 135  < > 134* 136 135 135 136  K 4.8  < > 3.9 4.3 4.4 4.2 4.1  CL 96*  < > 100* 102 104 104 105  CO2 25  < > 25 24 23  20* 22  GLUCOSE 90  < > 99 87 93 103* 95  BUN 18  < > 21* 17 13  14 14  CREATININE 1.50*  < > 1.51* 1.35* 1.27* 1.48* 1.44*  CALCIUM 10.3  < > 9.6 9.7 9.5 9.6 9.4  MG 2.1  --   --   --   --   --   --   < > = values in this interval not displayed.  Liver Function Tests: No results for input(s): AST, ALT, ALKPHOS, BILITOT, PROT, ALBUMIN in the last 168 hours. No results for input(s): LIPASE, AMYLASE in the last 168 hours. No results for input(s): AMMONIA in the last 168 hours.  CBC:  Recent Labs Lab 05/08/16 0400 05/09/16 0500 05/10/16 0533 05/11/16 0500 05/12/16 0439  WBC 11.6* 9.9 11.6* 10.7* 11.1*  HGB 13.9 13.9 14.0 13.5 12.9*  HCT 40.9 42.2 42.7 41.6 39.8  MCV 82.1 83.4 83.6 83.9 83.8  PLT 330 335 337 305 330    Cardiac Enzymes: No results for input(s): CKTOTAL, CKMB, CKMBINDEX, TROPONINI in the last 168 hours.  BNP: BNP (last 3 results)  Recent Labs  05/03/16 0435  BNP 1,034.8*    ProBNP (last 3 results) No results for input(s): PROBNP in the last 8760 hours.    Other results:  Imaging: No results found.   Medications:      Scheduled Medications: . aspirin  81 mg Oral Daily  . digoxin  0.125 mg Oral Daily  . enoxaparin (LOVENOX) injection  40 mg Subcutaneous Q24H  . isosorbide-hydrALAZINE  0.5 tablet Oral TID  . ivabradine  2.5 mg Oral BID WC  . rosuvastatin  20 mg Oral q1800  . sodium chloride flush  10-40 mL Intracatheter Q12H  . spironolactone  25 mg Oral Daily  . ticagrelor  90 mg Oral BID  . traZODone  100 mg Oral QHS    Infusions:   PRN Medications: sodium chloride, acetaminophen, ondansetron (ZOFRAN) IV, sodium chloride flush, traMADol   Assessment:   1. Cardiogenic shock 2. PEA arrest 3. Acute on chronic systolic HF   - Echo 08/29/24 LVEF 15-20% due to ischemic cardiomyopathy.  4. Acute hypoxemic respiratory failure 5. Elevated troponin 6. PAF 7. CAD    --LHC -->3v CAD with several high grade lesions in RCA and LAD. Plan for PCI next week.  s/p ('09 PCI to LAD, mRCA, '10 PCI to Lcx and '12 mRCA),  8. CKD, stage III 9. Tobacco use    -says he is smoking 1/2 cigarette per day 10. Anemia     --received  Feraheme 05/02/2016. Hgb stable -->12.7 FOBT.  11. Hypokalemia-     -resolved  Plan/Discussion:    CO-OX marginal 54%. Asymptomatic. BP remains soft. Volume status stable. CVP 4. No diuretics. Continue bidil 1/2 tab three times a day. No BB with hypotension. Continue low dose corlanor. Renal function stable.    Will continue Brillinta and ASA now. Hold DOAC for now. Will be able to drop ASA in 30 days. Anticipate adding eliquis. He has hives with Plavix.  Per EP--> Hold off on CRT-D until 30 days after PCI.    Darrick Grinder, NP  8:07 AM 05/12/2016   Advanced Heart Failure Team Pager [phone] (M-F; 7a - 4p)  Please contact [facility] for night-coverage after hours (4p -7a ) and weekends on [email or web address]  Patient seen and examined with Darrick Grinder, NP. We discussed all aspects of the encounter. I agree with the assessment and plan as stated above.   Overall improved but SBP in  70s. Co-ox marginal. Will hold diuretics today. Cut back bidil.   Continue Brillinta &  ASA.   If BP better can go home in AM. Will need CR as outpatient.   Alieyah Spader,MD 11:59 AM

## 2016-05-12 NOTE — Progress Notes (Signed)
CARDIAC REHAB PHASE I   PRE:  Rate/Rhythm: 84 SR    BP: sitting 83/48    SaO2:   MODE:  Ambulation: 1410 ft   POST:  Rate/Rhythm: 95 SR    BP: sitting 102/69     SaO2:   Tolerated well, independent pushing IV pole. Quick pace but tired after 3 laps. Encouraged walking on his own. [place], ACSM 05/12/2016 3:19 PM

## 2016-05-13 ENCOUNTER — Telehealth (HOSPITAL_COMMUNITY): Payer: Self-pay | Admitting: Pharmacist

## 2016-05-13 DIAGNOSIS — N183 Chronic kidney disease, stage 3 unspecified: Secondary | ICD-10-CM

## 2016-05-13 DIAGNOSIS — R7989 Other specified abnormal findings of blood chemistry: Secondary | ICD-10-CM

## 2016-05-13 DIAGNOSIS — D649 Anemia, unspecified: Secondary | ICD-10-CM

## 2016-05-13 DIAGNOSIS — J9601 Acute respiratory failure with hypoxia: Secondary | ICD-10-CM

## 2016-05-13 DIAGNOSIS — I469 Cardiac arrest, cause unspecified: Secondary | ICD-10-CM

## 2016-05-13 DIAGNOSIS — R778 Other specified abnormalities of plasma proteins: Secondary | ICD-10-CM

## 2016-05-13 DIAGNOSIS — E876 Hypokalemia: Secondary | ICD-10-CM

## 2016-05-13 LAB — BASIC METABOLIC PANEL
ANION GAP: 9 (ref 5–15)
BUN: 11 mg/dL (ref 6–20)
CO2: 21 mmol/L — AB (ref 22–32)
Calcium: 9.5 mg/dL (ref 8.9–10.3)
Chloride: 104 mmol/L (ref 101–111)
Creatinine, Ser: 1.3 mg/dL — ABNORMAL HIGH (ref 0.61–1.24)
GFR calc Af Amer: >60 mL/min (ref >60)
GLUCOSE: 104 mg/dL — AB (ref 65–99)
Potassium: 4 mmol/L (ref 3.5–5.1)
Sodium: 134 mmol/L — ABNORMAL LOW (ref 135–145)

## 2016-05-13 LAB — COOXEMETRY PANEL
Carboxyhemoglobin: 1.4 % (ref 0.5–1.5)
METHEMOGLOBIN: 0.8 % (ref 0.0–1.5)
O2 Saturation: 63.8 %
TOTAL HEMOGLOBIN: 13.6 g/dL (ref 12.0–16.0)

## 2016-05-13 LAB — CBC
HCT: 39.4 % (ref 39.0–52.0)
Hemoglobin: 12.9 g/dL — ABNORMAL LOW (ref 13.0–17.0)
MCH: 27.3 pg (ref 26.0–34.0)
MCHC: 32.7 g/dL (ref 30.0–36.0)
MCV: 83.3 fL (ref 78.0–100.0)
PLATELETS: 319 10*3/uL (ref 150–400)
RBC: 4.73 MIL/uL (ref 4.22–5.81)
RDW: 16.6 % — AB (ref 11.5–15.5)
WBC: 10.6 10*3/uL — AB (ref 4.0–10.5)

## 2016-05-13 LAB — MAGNESIUM: Magnesium: 1.8 mg/dL (ref 1.7–2.4)

## 2016-05-13 MED ORDER — DIGOXIN 125 MCG PO TABS: 0.1250 mg | ORAL_TABLET | Freq: Every day | ORAL | 6 refills | Status: DC

## 2016-05-13 MED ORDER — MAGNESIUM SULFATE 2 GM/50ML IV SOLN
2.0000 g | Freq: Once | INTRAVENOUS | Status: AC
  Administered 2016-05-13: 2 g via INTRAVENOUS
  Filled 2016-05-13: qty 50

## 2016-05-13 MED ORDER — FUROSEMIDE 40 MG PO TABS: 40.0000 mg | ORAL_TABLET | Freq: Every day | ORAL | 6 refills | Status: DC

## 2016-05-13 MED ORDER — SPIRONOLACTONE 25 MG PO TABS: 25.0000 mg | ORAL_TABLET | Freq: Every day | ORAL | 6 refills | Status: DC

## 2016-05-13 MED ORDER — ASPIRIN 81 MG PO CHEW: 81.0000 mg | CHEWABLE_TABLET | Freq: Every day | ORAL | 6 refills | Status: DC

## 2016-05-13 MED ORDER — TICAGRELOR 90 MG PO TABS: 90.0000 mg | ORAL_TABLET | Freq: Two times a day (BID) | ORAL | 6 refills | Status: DC

## 2016-05-13 MED ORDER — ROSUVASTATIN CALCIUM 20 MG PO TABS: 20.0000 mg | ORAL_TABLET | Freq: Every day | ORAL | 6 refills | Status: DC

## 2016-05-13 MED ORDER — ISOSORB DINITRATE-HYDRALAZINE 20-37.5 MG PO TABS: 0.5000 | ORAL_TABLET | Freq: Three times a day (TID) | ORAL | 6 refills | Status: DC

## 2016-05-13 MED ORDER — IVABRADINE HCL 5 MG PO TABS: 2.5000 mg | ORAL_TABLET | Freq: Two times a day (BID) | ORAL | 6 refills | Status: DC

## 2016-05-13 NOTE — Progress Notes (Signed)
Patient ID: David [name], male   DOB: 06/17/1965, 51 y.o.   MRN: [id number] P   Advanced Heart Failure Rounding Note  Cardiologist: Dr. Geraldo Pitter [facility])  Subjective:    Extubated 05/01/2016.   ECHO 05/03/2015: EF 15-20%. RV mildly dilated.   --1/11 LHC showed 3V CAD high grade lesion RCA and LAD.  IV Lasix last dose on 1/12. --S/p PCI with successful stenting of the proximal to mid RCA and PLOM with DES x 2. Successful stenting of the mid LAD with DES. 05/09/16. Had several episodes of bradycardia after PCI, symptomatic, with rates down into the 20s. Improved without intervention.   Coox stable 63.8%. CVP 3-4. Diuretics held yesterday with SBP in 70s.  Feeling great today. No SOB, CP, or further lightheadedness. SBP 80-100s.    RHC/LHC 05/06/2016   Prox RCA to Mid RCA lesion, 80 %stenosed.  Mid RCA lesion, 40 %stenosed.  Dist RCA lesion, 20 %stenosed.  Post Atrio lesion, 80 %stenosed.  Ost Cx to Mid Cx lesion, 40 %stenosed.  Mid LAD to Dist LAD lesion, 70 %stenosed.  Dist LAD lesion, 80 %stenosed.  Findings: Ao = 101/75 (87) LV = 102/35 RA =  8 RV =  62/10 PA =  65/34 (47) PCW = 37 (v = 45) Fick cardiac output/index = 3.8/2.1 PVR = 2.9 WU SVR = 1555 FA sat = 96% PA sat = 60%. 61%    Objective:   Weight Range:  Vital Signs:   Temp:  [97.4 F (36.3 C)-98.2 F (36.8 C)] 98.1 F (36.7 C) (01/19 0802) Resp:  [13-31] 20 (01/19 0429) BP: (85-100)/(56-68) 100/68 (01/19 0429) SpO2:  [97 %-98 %] 98 % (01/19 0429) Weight:  [152 lb 1.6 oz (69 kg)] 152 lb 1.6 oz (69 kg) (01/19 0429) Last BM Date: 05/12/16  Weight change: Filed Weights   05/11/16 0500 05/12/16 0400 05/13/16 0429  Weight: 150 lb 14.4 oz (68.4 kg) 151 lb 9.6 oz (68.8 kg) 152 lb 1.6 oz (69 kg)    Intake/Output:   Intake/Output Summary (Last 24 hours) at 05/13/16 0839 Last data filed at 05/12/16 1900  Gross per 24 hour  Intake              730 ml  Output             1401 ml  Net              -671 ml     Physical Exam: CVP 3-4 General: Seated in chair.  NAD.  HEENT: Normal Neck: supple. RIJ TLC. JVP flat. Carotids 2+ bilat; no bruits. No thyromegaly or nodule noted.  Cor: PMI laterally displaced. RRR. No M/G/R noted.  Lungs: CTAB, normal effort Abdomen: soft, NT, ND, no HSM. No bruits or masses. +BS  Extremities: no cyanosis, clubbing, rash. No edema.   Neuro: alert & orientedx3, cranial nerves grossly intact. moves all 4 extremities w/o difficulty. Affect pleasant  Telemetry: Reviewed, NSR 80-90s. Occasional PVCs and scant NSVT.   Labs: Basic Metabolic Panel:  Recent Labs Lab 05/06/16 1240  05/09/16 0500 05/10/16 0533 05/11/16 0500 05/12/16 0439 05/13/16 0614 05/13/16 0830  NA 135  < > 136 135 135 136 134*  --   K 4.8  < > 4.3 4.4 4.2 4.1 4.0  --   CL 96*  < > 102 104 104 105 104  --   CO2 25  < > 24 23 20* 22 21*  --   GLUCOSE 90  < > 87 93 103* 95  104*  --   BUN 18  < > 17 13 14 14 11   --   CREATININE 1.50*  < > 1.35* 1.27* 1.48* 1.44* 1.30*  --   CALCIUM 10.3  < > 9.7 9.5 9.6 9.4 9.5  --   MG 2.1  --   --   --   --   --   --  1.8  < > = values in this interval not displayed.  Liver Function Tests: No results for input(s): AST, ALT, ALKPHOS, BILITOT, PROT, ALBUMIN in the last 168 hours. No results for input(s): LIPASE, AMYLASE in the last 168 hours. No results for input(s): AMMONIA in the last 168 hours.  CBC:  Recent Labs Lab 05/09/16 0500 05/10/16 0533 05/11/16 0500 05/12/16 0439 05/13/16 0614  WBC 9.9 11.6* 10.7* 11.1* 10.6*  HGB 13.9 14.0 13.5 12.9* 12.9*  HCT 42.2 42.7 41.6 39.8 39.4  MCV 83.4 83.6 83.9 83.8 83.3  PLT 335 337 305 330 319    Cardiac Enzymes: No results for input(s): CKTOTAL, CKMB, CKMBINDEX, TROPONINI in the last 168 hours.  BNP: BNP (last 3 results)  Recent Labs  05/03/16 0435  BNP 1,034.8*    ProBNP (last 3 results) No results for input(s): PROBNP in the last 8760 hours.    Other  results:  Imaging: No results found.   Medications:     Scheduled Medications: . aspirin  81 mg Oral Daily  . digoxin  0.125 mg Oral Daily  . enoxaparin (LOVENOX) injection  40 mg Subcutaneous Q24H  . isosorbide-hydrALAZINE  0.5 tablet Oral TID  . ivabradine  2.5 mg Oral BID WC  . rosuvastatin  20 mg Oral q1800  . sodium chloride flush  10-40 mL Intracatheter Q12H  . spironolactone  25 mg Oral Daily  . ticagrelor  90 mg Oral BID  . traZODone  100 mg Oral QHS    Infusions:   PRN Medications: sodium chloride, acetaminophen, ondansetron (ZOFRAN) IV, sodium chloride flush, traMADol   Assessment:   1. Cardiogenic shock 2. PEA arrest 3. Acute on chronic systolic HF   - Echo 12/27/55 LVEF 15-20% due to ischemic cardiomyopathy.  4. Acute hypoxemic respiratory failure 5. Elevated troponin 6. PAF 7. CAD    --LHC -->3v CAD with several high grade lesions in RCA and LAD. Plan for PCI next week.  s/p ('09 PCI to LAD, mRCA, '10 PCI to Lcx and '12 mRCA),  8. CKD, stage III 9. Tobacco use    -says he is smoking 1/2 cigarette per day 10. Anemia     --received  Feraheme 05/02/2016. Hgb stable -->12.7 FOBT.  11. Hypokalemia-     -resolved  Plan/Discussion:    CO-OX stable at 63%. CVP 3-4.   Volume status stable. Will resume home lasix TOMORROW at 40 mg daily, with extra 40 mg prn for weight gain of 3 lbs overnight or 5 lbs within one week.   Continue bidil 1/2 tab TID. Too soft to add anything additional.  a day. No BB with hypotension. Continue low dose corlanor. Renal function stable.    Will continue Brilinta and ASA. Will plan on dropping ASA and adding Eliquis in ~ 30 days. He has hives with Plavix.  Per EP--> Hold off on CRT-D until 30 days after PCI. Will schedule with EP at our office visit next week.   Has follow up in HF clinic next week.   Shirley Friar, PA-C  8:39 AM 05/13/2016   Advanced Heart Failure Team  Pager [phone] (M-F; [place])  Please  contact [facility] for night-coverage after hours (4p -7a ) and weekends on [email or web address]  Patient seen with PA, agree with the above note.  He is doing well today, no complaints.  Think he can go home with close followup.  Will continue Brilinta + ASA x 1 month, then as above will drop ASA and add either Eliquis or Xarelto 15.  Restart Lasix tomorrow.   Loralie Champagne 05/13/2016 9:08 AM

## 2016-05-13 NOTE — Telephone Encounter (Addendum)
Corlanor 5 mg BID (and under) PA approved by [facility] Part D through 05/13/18. Initial copay is $192/mo so I called and left VM for patient and wife to call back so that I can get him set up with [employer] foundation to help cover the cost of his copays.   ADDENDUM: Was able to enroll David [name] in [facility] foundation so that he will have $800 toward his Corlanor copay costs through 05/12/17. Relayed info to [facility] in [place] and verified $0 copay.   Member ID: [id number] Group ID: [id number] RxBin ID: [id number] PCN: [id number] Eligibility Start Date: 02/13/2016 Eligibility End Date: 05/12/2017   Ruta Hinds. Velva Harman, PharmD, BCPS, CPP Clinical Pharmacist Pager: [phone] Phone: [phone] 05/13/2016 1:26 PM

## 2016-05-13 NOTE — Progress Notes (Signed)
CARDIAC REHAB PHASE I   PRE:  Rate/Rhythm: 98 SR c/ PVCs  BP:  Sitting: 109/61        SaO2: 99 RA  MODE:  Ambulation: 1410 ft   POST:  Rate/Rhythm: 109 ST c/ PVCs  BP:  Sitting: 109/68         SaO2: 99 RA  Pt ambulated 1410 on RA, IV, independent, steady gait, tolerated well with no complaints. VSS. Pt states he has no questions regarding education at this time, reinforced as able. Phase 2 cardiac rehab referral sent to [place]. Unable to locate stent card, RN notified. Pt to recliner after walk, call bell within reach, eager for discharge.   [id number] Lenna Sciara, RN, BSN 05/13/2016 10:03 AM

## 2016-05-13 NOTE — Care Management Note (Addendum)
Case Management Note  Patient Details  Name: Bane Hagy MRN: [id number] Date of Birth: 12-Sep-1965  Subjective/Objective:  Adm w chf                  Action/Plan: to go home w wife today.   Expected Discharge Date:  05/13/16               Expected Discharge Plan:  Home/Self Care  In-House Referral:     Discharge planning Services  CM Consult, Medication Assistance, HF Clinic  Post Acute Care Choice:    Choice offered to:     DME Arranged:    DME Agency:     HH Arranged:    HH Agency:     Status of Service:  Completed, signed off  If discussed at [employer], dates discussed:    Additional Comments:md states close followup at office. For dc home today.  Late entry 05-13-16 at 15:01p Wife victoria called and copay for corlanor is 192.00 per month. copay card they cant use since pt has [employer]. Spoke w phy asst andy and he will call wife to discuss med and office will work on getting thru pan foundation.  Lacretia Leigh, RN 05/13/2016, 9:38 AM

## 2016-05-13 NOTE — Discharge Summary (Signed)
Advanced Heart Failure Discharge Note  Discharge Summary   Patient ID: David [name] MRN: [id number], DOB/AGE: [birth year] 51 y.o. Admit date: 04/30/2016 D/C date:     05/13/2016   Primary Discharge Diagnoses:  1. Cardiogenic shock 2. PEA arrest 3. Acute on chronic systolic HF - Echo 0/2/58 LVEF 15-20% 4. Acute hypoxemic respiratory failure 5. Elevated troponin 6. PAF 7. CAD 8. CKD stage III 9. Tobacco abuse 10 Anemia 11. Hypokalemia  Consultation: EP, Critical Care, Cardiac Rehab.   Hospital Course:   David [name] is a 51 y.o. male w PMH of ICM ('09 PCI to LAD, mRCA, '10 PCI to Lcx and '12 mRCA), HFrEF (EF ~20% for > 5 years), HLD and HTN who presented to [facility] on 04/29/16 after ~ 2 weeks of worsening SOB, DOE, and orthopnea.   In [place] ED, Pt developed symptomatic tachycardia thought to be AFib/AFL and was given bolus of IV amiodarone. He then developed bradycardia into the 20s and loss of pulse. Required 1-2 rounds of CPR and intubated. Started on dopamine and dobutamine for cardiogenic shock.   Transferred to [place] and transitioned from Dopamine to Levophed.   Extubated am of 05/01/16. Pressors weaned and HF meds added as tolerated. Continued to diurese with elevated CVP. Iron stores low on anemia panel and supplemented.   Repeat Echo showed LVEF 15-20%.   Cath on 05/05/16 (full report below) with 3V CAD and high grade lesion RCA and LAD. PCWP elevated at 37 so IV lasix restarted to optimize prior to return to cath lab for PCI.   Pt underwent successful stenting of the proximal to mid RCA and PLOM with DES x 2 and successful stenting of the mid LAD with DES 05/09/16.   After patient returned from PCI, noted to have 2 episodes of bradycardia into the 20s with lightheadedness. Thought to possible be due to re-perfusion. Atropine kept at bedside but pt pt had no further events.  EP consulted to consider ICD this admission. Felt like bradycardia may have been more  vagally related and decided to defer for CRT-D consideration as outpatient with restoration of normal skin flora outside of hospital.   Pt medications adjusted and watched for further bradycardia on telemetry. Examined am of 05/13/16 and thought to be stable for discharge with close follow up as below.  Overall, pt diuresed 14.L and down 15 lbs from admission weight.  Lasix decreased slightly for home with extra prn with CVP 3-4 on day of discharge.   Pt will have close HF follow up as below. Knows to call clinic with any questions.  Pt will be kept on ASA and Brilinta for 30 days s/p cath, then will be transitioned off ASA and onto Eliquis vs Xarelto.   Discharge Weight Range: 152 lbs Discharge Vitals: Blood pressure 100/68, pulse 87, temperature 98.1 F (36.7 C), temperature source Oral, resp. rate 20, height 5\' 5"  (1.651 m), weight 152 lb 1.6 oz (69 kg), SpO2 98 %.  Labs: Lab Results  Component Value Date   WBC 10.6 (H) 05/13/2016   HGB 12.9 (L) 05/13/2016   HCT 39.4 05/13/2016   MCV 83.3 05/13/2016   PLT 319 05/13/2016     Recent Labs Lab 05/13/16 0614  NA 134*  K 4.0  CL 104  CO2 21*  BUN 11  CREATININE 1.30*  CALCIUM 9.5  GLUCOSE 104*   Lab Results  Component Value Date   CHOL (H) 01/20/2008    267  ATP III CLASSIFICATION:  <200     mg/dL   Desirable  200-239  mg/dL   Borderline High  >=240    mg/dL   High   HDL 29 (L) 01/20/2008   LDLCALC (H) 01/20/2008    173        Total Cholesterol/HDL:CHD Risk Coronary Heart Disease Risk Table                     Men   Women  1/2 Average Risk   3.4   3.3   TRIG 323 (H) 01/20/2008   BNP (last 3 results)  Recent Labs  05/03/16 0435  BNP 1,034.8*    ProBNP (last 3 results) No results for input(s): PROBNP in the last 8760 hours.   Diagnostic Studies/Procedures   RHC/LHC 05/06/2016   Prox RCA to Mid RCA lesion, 80 %stenosed.  Mid RCA lesion, 40 %stenosed.  Dist RCA lesion, 20 %stenosed.  Post Atrio  lesion, 80 %stenosed.  Ost Cx to Mid Cx lesion, 40 %stenosed.  Mid LAD to Dist LAD lesion, 70 %stenosed.  Dist LAD lesion, 80 %stenosed. Findings: Ao = 101/75 (87) LV = 102/35 RA = 8 RV = 62/10 PA = 65/34 (47) PCW = 37 (v = 45) Fick cardiac output/index = 3.8/2.1 PVR = 2.9 WU SVR = 1555 FA sat = 96% PA sat = 60%. 61%   Discharge Medications   Allergies as of 05/13/2016      Reactions   Plavix [clopidogrel Bisulfate] Hives      Medication List    STOP taking these medications   aspirin EC 325 MG tablet Replaced by:  aspirin 81 MG chewable tablet   carvedilol 12.5 MG tablet Commonly known as:  COREG   lisinopril 20 MG tablet Commonly known as:  PRINIVIL,ZESTRIL   lisinopril 5 MG tablet Commonly known as:  PRINIVIL,ZESTRIL   prasugrel 10 MG Tabs tablet Commonly known as:  EFFIENT     TAKE these medications   ADULT GUMMY Chew Chew 2 tablets by mouth daily.   aspirin 81 MG chewable tablet Chew 1 tablet (81 mg total) by mouth daily. Replaces:  aspirin EC 325 MG tablet   digoxin 0.125 MG tablet Commonly known as:  LANOXIN Take 1 tablet (0.125 mg total) by mouth daily.   furosemide 40 MG tablet Commonly known as:  LASIX Take 1 tablet (40 mg total) by mouth daily. Take extra 40 mg as needed for weight gain of 3 lbs overnight or 5 lbs one week. What changed:  how much to take  additional instructions   isosorbide-hydrALAZINE 20-37.5 MG tablet Commonly known as:  BIDIL Take 0.5 tablets by mouth 3 (three) times daily.   ivabradine 5 MG Tabs tablet Commonly known as:  CORLANOR Take 0.5 tablets (2.5 mg total) by mouth 2 (two) times daily with a meal.   nitroGLYCERIN 0.4 MG SL tablet Commonly known as:  NITROSTAT Place 1 tablet (0.4 mg total) under the tongue every 5 (five) minutes as needed for chest pain.   rosuvastatin 20 MG tablet Commonly known as:  CRESTOR Take 1 tablet (20 mg total) by mouth daily at 6 PM. What changed:  medication  strength  how much to take  when to take this   spironolactone 25 MG tablet Commonly known as:  ALDACTONE Take 1 tablet (25 mg total) by mouth daily.   ticagrelor 90 MG Tabs tablet Commonly known as:  BRILINTA Take 1 tablet (90 mg total) by mouth 2 (  two) times daily.       Disposition   The patient will be discharged in stable condition to home. Discharge Instructions    (HEART FAILURE PATIENTS) Call MD:  Anytime you have any of the following symptoms: 1) 3 pound weight gain in 24 hours or 5 pounds in 1 week 2) shortness of breath, with or without a dry hacking cough 3) swelling in the hands, feet or stomach 4) if you have to sleep on extra pillows at night in order to breathe.    Complete by:  As directed    Amb Referral to Cardiac Rehabilitation    Complete by:  As directed    Diagnosis:  Coronary Stents Comment - To [facility]   Amb Referral to HF Clinic    Complete by:  As directed    Diet - low sodium heart healthy    Complete by:  As directed    Heart Failure patients record your daily weight using the same scale at the same time of day    Complete by:  As directed    Increase activity slowly    Complete by:  As directed      Follow-up Information    Glori Bickers, MD. Go on 05/19/2016.   Specialty:  Cardiology Why:  For post hospital follow up at 300 pm.  Garage code for parking is [phone]. Contact information: [address] [place] [address] [phone]             Duration of Discharge Encounter: Greater than 35 minutes   Signed, Annamaria Helling 05/13/2016, 11:19 AM

## 2016-05-19 ENCOUNTER — Ambulatory Visit (HOSPITAL_COMMUNITY)
Admit: 2016-05-19 | Discharge: 2016-05-19 | Disposition: A | Discharge status: Home / Self Care | Payer: Medicare HMO | Attending: Internal Medicine | Admitting: Internal Medicine

## 2016-05-19 ENCOUNTER — Encounter (HOSPITAL_COMMUNITY): Payer: Self-pay

## 2016-05-19 VITALS — BP 105/66 | HR 86 | Wt 153.0 lb

## 2016-05-19 DIAGNOSIS — I5022 Chronic systolic (congestive) heart failure: Secondary | ICD-10-CM | POA: Insufficient documentation

## 2016-05-19 DIAGNOSIS — I480 Paroxysmal atrial fibrillation: Secondary | ICD-10-CM | POA: Diagnosis not present

## 2016-05-19 DIAGNOSIS — N183 Chronic kidney disease, stage 3 unspecified: Secondary | ICD-10-CM

## 2016-05-19 DIAGNOSIS — I10 Essential (primary) hypertension: Secondary | ICD-10-CM | POA: Diagnosis not present

## 2016-05-19 DIAGNOSIS — I2510 Atherosclerotic heart disease of native coronary artery without angina pectoris: Secondary | ICD-10-CM

## 2016-05-19 DIAGNOSIS — D649 Anemia, unspecified: Secondary | ICD-10-CM

## 2016-05-19 LAB — BASIC METABOLIC PANEL
ANION GAP: 11 (ref 5–15)
BUN: 14 mg/dL (ref 6–20)
CHLORIDE: 99 mmol/L — AB (ref 101–111)
CO2: 26 mmol/L (ref 22–32)
Calcium: 10.1 mg/dL (ref 8.9–10.3)
Creatinine, Ser: 1.43 mg/dL — ABNORMAL HIGH (ref 0.61–1.24)
GFR calc non Af Amer: 55 mL/min — ABNORMAL LOW (ref >60)
Glucose, Bld: 87 mg/dL (ref 65–99)
Potassium: 3.9 mmol/L (ref 3.5–5.1)
Sodium: 136 mmol/L (ref 135–145)

## 2016-05-19 LAB — CBC
HEMATOCRIT: 40.1 % (ref 39.0–52.0)
HEMOGLOBIN: 13.2 g/dL (ref 13.0–17.0)
MCH: 27.2 pg (ref 26.0–34.0)
MCHC: 32.9 g/dL (ref 30.0–36.0)
MCV: 82.5 fL (ref 78.0–100.0)
Platelets: 356 10*3/uL (ref 150–400)
RBC: 4.86 MIL/uL (ref 4.22–5.81)
RDW: 15 % (ref 11.5–15.5)
WBC: 12 10*3/uL — AB (ref 4.0–10.5)

## 2016-05-19 LAB — DIGOXIN LEVEL: DIGOXIN LVL: 0.4 ng/mL — AB (ref 0.8–2.0)

## 2016-05-19 LAB — BRAIN NATRIURETIC PEPTIDE: B Natriuretic Peptide: 470 pg/mL — ABNORMAL HIGH (ref 0.0–100.0)

## 2016-05-19 MED ORDER — ISOSORB DINITRATE-HYDRALAZINE 20-37.5 MG PO TABS: 0.5000 | ORAL_TABLET | Freq: Two times a day (BID) | ORAL | 6 refills | Status: DC | PRN

## 2016-05-19 NOTE — Progress Notes (Signed)
Advanced Heart Failure Clinic Note   Primary Cardiologist : Dr. Geraldo Pitter [facility]) Primary HF: Dr. Haroldine Laws   HPI:  David [name] is a 51 y.o. male w PMH of ICM ('09 PCI to LAD, mRCA, '10 PCI to Lcx and '12 mRCA), HFrEF (EF ~20% for > 5 years), HLD and HTN who presented to [facility] on 04/29/16 after ~ 2 weeks of worsening SOB, DOE, and orthopnea.   Admitted to [facility] ED 04/29/16 for Afib/AFL. Had Brady/PEA arrest, was intubated, started on pressors and transferred to [facility]. Pressors weaned as tolerated. Extubated 05/01/16. Repeat Echo showed LVEF 15-20%.   Cath on 05/05/16 (full report below) with 3V CAD and high grade lesion RCA and LAD. PCWP 37 so diuresed for optimization prior to PCI.  Pt underwent successful stenting of the proximal to mid RCA and PLOM with DES x 2 and successful stenting of the mid LAD with DES 05/09/16. Placed on ASA and Brilinta for 30 days, and then to transition off ASA and onto Eliquis/Xarelto.   Pt noted to have 2 episodes of bradycardia into the 20s with lightheadedness s/p cath. No further episodes after several hours. No intervention required. EP consulted to consider ICD with brady episodes. Felt like more vagal induced and deferred CRT-D consideration for outpatient. Discharge weight 152 lbs.  Pt presents today for post hospital follow up.  Overall has felt OK but energy up and down throughout the day.  Both pt and Wife note severe fatigue and worse SOB. Dizzy and lightheadedness with this as well.  "Nowhere close" to episodes of his bradycardia in hospital. Weight at home 149 - 150. Urine output has been Ok. Said he had a small amount of blood in his urine earlier this week, but none before or since. Otherwise denies bleeding.   RHC/LHC 05/06/2016   Prox RCA to Mid RCA lesion, 80 %stenosed.  Mid RCA lesion, 40 %stenosed.  Dist RCA lesion, 20 %stenosed.  Post Atrio lesion, 80 %stenosed.  Ost Cx to Mid Cx lesion, 40 %stenosed.  Mid LAD to  Dist LAD lesion, 70 %stenosed.  Dist LAD lesion, 80 %stenosed. Findings: Ao = 101/75 (87) LV = 102/35 RA = 8 RV = 62/10 PA = 65/34 (47) PCW = 37 (v = 45) Fick cardiac output/index = 3.8/2.1 PVR = 2.9 WU SVR = 1555 FA sat = 96% PA sat = 60%. 61%  Past Medical History:  Diagnosis Date  . CAD (coronary artery disease) 2009   AMI in 12/2007 with PCI to LAD, staged PCI to  mid/distal RCA, NSTEMI in 02/2009 with BMS to LCx  . CHF (congestive heart failure) ([place])   . HLD (hyperlipidemia)   . HTN (hypertension)   . Ischemic cardiomyopathy    Admitted in 07/2010 with CHF exacerbation    Current Outpatient Prescriptions  Medication Sig Dispense Refill  . aspirin 81 MG chewable tablet Chew 1 tablet (81 mg total) by mouth daily. 30 tablet 6  . digoxin (LANOXIN) 0.125 MG tablet Take 1 tablet (0.125 mg total) by mouth daily. 30 tablet 6  . furosemide (LASIX) 40 MG tablet Take 1 tablet (40 mg total) by mouth daily. Take extra 40 mg as needed for weight gain of 3 lbs overnight or 5 lbs one week. 40 tablet 6  . isosorbide-hydrALAZINE (BIDIL) 20-37.5 MG tablet Take 0.5 tablets by mouth 3 (three) times daily. 45 tablet 6  . ivabradine (CORLANOR) 5 MG TABS tablet Take 0.5 tablets (2.5 mg total) by mouth 2 (two) times  daily with a meal. 60 tablet 6  . Multiple Vitamins-Minerals (ADULT GUMMY) CHEW Chew 2 tablets by mouth daily.    . nitroGLYCERIN (NITROSTAT) 0.4 MG SL tablet Place 1 tablet (0.4 mg total) under the tongue every 5 (five) minutes as needed for chest pain. (Patient not taking: Reported on 05/02/2016) 25 tablet 3  . rosuvastatin (CRESTOR) 20 MG tablet Take 1 tablet (20 mg total) by mouth daily at 6 PM. 30 tablet 6  . spironolactone (ALDACTONE) 25 MG tablet Take 1 tablet (25 mg total) by mouth daily. 30 tablet 6  . ticagrelor (BRILINTA) 90 MG TABS tablet Take 1 tablet (90 mg total) by mouth 2 (two) times daily. 60 tablet 6   No current facility-administered medications for this  encounter.     Allergies  Allergen Reactions  . Plavix [Clopidogrel Bisulfate] Hives      Social History   Social History  . Marital status: Married    Spouse name: N/A  . Number of children: N/A  . Years of education: N/A   Occupational History  . Not on file.   Social History Main Topics  . Smoking status: Current Every Day Smoker    Packs/day: 0.20    Years: 30.00    Types: Cigars  . Smokeless tobacco: Never Used     Comment: Used to smoke cigarettes. Pt is currently smoking 3 black & mild cigars per day. He has cut down but never quit.  . Alcohol use No  . Drug use: No  . Sexual activity: Not on file   Other Topics Concern  . Not on file   Social History Narrative  . No narrative on file      Family History  Problem Relation Age of Onset  . Coronary artery disease Father   . Hypertension Father   . Hypertension Sister   . Coronary artery disease Brother   . Emphysema Brother   . Coronary artery disease Mother   . Hypertension Mother   . Emphysema Mother   . Hypertension Daughter   . Obesity Daughter   . Diabetes    . Hypertension    . Coronary artery disease      Vitals:   05/19/16 1517  BP: 105/66  Pulse: 86  SpO2: 96%  Weight: 153 lb (69.4 kg)   Wt Readings from Last 3 Encounters:  05/19/16 153 lb (69.4 kg)  05/13/16 152 lb 1.6 oz (69 kg)  03/15/11 156 lb 4.9 oz (70.9 kg)     PHYSICAL EXAM: General:  Well appearing. NAD. appearing. No respiratory difficulty HEENT: Normal Neck: supple. JVP 6-7 cm. Carotids 2+ bilat; no bruits. No thyromegaly or nodule noted. Cor: PMI nondisplaced. Regular rate & rhythm. No rubs, gallops or murmurs. Lungs: Clear, normal effort. Abdomen: soft, nontender, nondistended. No hepatosplenomegaly. No bruits or masses. Good bowel sounds. Extremities: no cyanosis, clubbing, rash, edema Neuro: alert & oriented x 3, cranial nerves grossly intact. moves all 4 extremities w/o difficulty. Affect  pleasant.  ASSESSMENT & PLAN:  1. Chronic systolic HF - Echo 09/25/35 LVEF 15-20% 2/2 ICM 2. CAD s/p PCI to mid RCA and PLOM with DES x 2 and DES to mid LAD.  3. PAF 4. CKD stage III 5. Tobacco abuse. 6. Anemia  Pt overall stable from recent admission, but not tolerating currentdose of Bidil. Suspect BP dropping, which pt was shown to be very sensitive to during recent admission. Decrease Bidil to 0.5 mg BID for now.   Volume status stable  on exam. Continue lasix 40 mg daily with extra 40 mg as needed. Continue spironolactone 25 mg daily.   Continue digoxin 0.125 mg daily. Check level today.  Rate stable. Continue corlanor 2.5 mg BID.   Had ordered and planned for EKG today, but pt left prior to EKG tech arrival.  Pt in NSR on exam.   Continue ASA 81 mg daily and Brilinta 90 mg BID with recent stent.  Will plan on stopping ASA and transitioning to Eliquis at 30 days post stent.    Small amount of blood noted in patients urine earlier this week. Isolated incident, may be 2/2 to foley during admission.  Pt knows to call with any recurrence. CBC today.   Stressed importance of ASA and Brilinta with recent stent  Pt complaining of itching. <1% side effect profile for Corlanor.  Will try Benadryl prn, but if continues Corlanor would be most likely agent of current meds per Pharm D.   BMET, BNP, CBC, and Dig level today.  Will schedule to see EP for ICD scheduling.  Follow up with MD 3-4 weeks.  With significant morbidities, will ask for [facility] to follow at home for disease state management.   Shirley Friar, PA-C 05/19/16   Patient seen and examined with Oda Kilts, PA-C. We discussed all aspects of the encounter. I agree with the assessment and plan as stated above.   He remains tenuous. BP dropping with tid dosing of Bidil. Will switch to BID. Volume status looks ago. With recent low output will not add back b-blocker yet. Agree with anticoag plan as above. Refer to EP for possible  CRT-D. He does high a high burden of PVCs and may benefit from [facility]. Will await EP evaluation.   Will need CPX in near future to evaluate need for advanced therapies.   Total time spent 40 minutes. Over half that time spent discussing above.   Dozier Berkovich,MD 9:38 PM

## 2016-05-19 NOTE — Patient Instructions (Addendum)
Routine lab work today. Will notify you of abnormal results, otherwise no news is good news!  DECREASE Bidil to 1/2 tablet TWICE daily.  Will refer you to electrophysiology at [facility]. Address: [address] ([place]), [place], [place] [address]  Phone: [phone]  Will refer you to [facility] for [facility] Nurse.  Follow up 2-4 weeks with Dr. Haroldine Laws.  Do the following things EVERYDAY: 1) Weigh yourself in the morning before breakfast. Write it down and keep it in a log. 2) Take your medicines as prescribed 3) Eat low salt foods-Limit salt (sodium) to 2000 mg per day.  4) Stay as active as you can everyday 5) Limit all fluids for the day to less than 2 liters

## 2016-05-24 ENCOUNTER — Encounter (INDEPENDENT_AMBULATORY_CARE_PROVIDER_SITE_OTHER): Payer: Self-pay

## 2016-05-24 ENCOUNTER — Ambulatory Visit (INDEPENDENT_AMBULATORY_CARE_PROVIDER_SITE_OTHER): Payer: Medicare HMO | Admitting: Internal Medicine

## 2016-05-24 ENCOUNTER — Encounter: Payer: Self-pay | Admitting: Internal Medicine

## 2016-05-24 ENCOUNTER — Encounter (HOSPITAL_COMMUNITY): Payer: Self-pay

## 2016-05-24 ENCOUNTER — Telehealth (HOSPITAL_COMMUNITY): Payer: Self-pay | Admitting: *Deleted

## 2016-05-24 VITALS — BP 130/60 | HR 91 | Ht 65.0 in | Wt 152.8 lb

## 2016-05-24 DIAGNOSIS — I5022 Chronic systolic (congestive) heart failure: Secondary | ICD-10-CM | POA: Diagnosis not present

## 2016-05-24 NOTE — Patient Instructions (Signed)
Medication Instructions: - Your physician recommends that you continue on your current medications as directed. Please refer to the Current Medication list given to you today.  Labwork: - Your physician recommends that you have lab work today: BMP/ Magnesium  Procedures/Testing: - none ordered  Follow-Up: - pending Dr. Olin Pia discussion with Dr. Haroldine Laws  Any Additional Special Instructions Will Be Listed Below (If Applicable).     If you need a refill on your cardiac medications before your next appointment, please call your pharmacy.

## 2016-05-24 NOTE — Progress Notes (Signed)
New patient home health skilled nursing order faxed to [facility] health services with last OV note. Fax # [phone] Copy of order scanned into patient's electronic medical record.  Renee Pain, RN

## 2016-05-24 NOTE — Progress Notes (Signed)
ELECTROPHYSIOLOGY CONSULT NOTE  Patient ID: David [name], MRN: [id number], DOB/AGE: 12/18/1965 51 y.o. Admit date: (Not on file) Date of Consult: 05/24/2016  Primary Physician: Gilford Rile, MD Primary Cardiologist: DB Consulting Physician DB  Chief Complaint: ICD   HPI David [name] is a 51 y.o. male  w history of ischemic cardiomyopathy and depressed LV function for more than 5 years (20% or so) prior PCI 2009 in 2010 2012; He acknowledges that he was taking medications not reliably  He had been largely noncompliant with medications following up with cardiology in [place]  in early January  hospitalized in transfer from [facility] where he had presented with atrial fibrillation or rapid rate and was treated with amiodarone. He developed bradycardia and suffered a (cardiac/bradycardia versus PE) arrest. He required dobutamine and dopamine. Was transferred was intubated; he was extubated shortly thereafter. Peak troponin was 2.01.  He underwent catheterization and stenting 3 including his LAD his RCA and posterolateral branch. He was treated with ticagrelor and aspirin.  Ejection fraction was 25%.  Post procedurally he had episodes of heart block at least one of which is associated with efforts to urination  Telemetry demonstrated at least nonsustained ventricular tachycardia; he also had frequent and complex ventricular ectopy.  Since discharge he is doing quite well. He is now taking medications on a regular basis. He is tolerating them well. He is able to climb a flight of stairs. He is able to carry groceries. He does not have peripheral edema.  He has had one episode of syncope. This occurred while standing on the stairs and was without warning. His neighbor started CPR. He's had a number of other episodes of presyncope that all had a similar stereotypical onset but the duration has only been about 15 seconds. These have not been associated with  palpitations.  He's also had episodes of tachypalpitations unassociated with lightheadedness.   Past Medical History:  Diagnosis Date  . CAD (coronary artery disease) 2009   AMI in 12/2007 with PCI to LAD, staged PCI to  mid/distal RCA, NSTEMI in 02/2009 with BMS to LCx  . CHF (congestive heart failure) ([place])   . HLD (hyperlipidemia)   . HTN (hypertension)   . Ischemic cardiomyopathy    Admitted in 07/2010 with CHF exacerbation      Surgical History:  Past Surgical History:  Procedure Laterality Date  . CARDIAC CATHETERIZATION N/A 05/05/2016   Procedure: Right/Left Heart Cath and Coronary Angiography;  Surgeon: Jolaine Artist, MD;  Location: [place] [facility];  Service: Cardiovascular;  Laterality: N/A;  . CARDIAC CATHETERIZATION N/A 05/09/2016   Procedure: Coronary Stent Intervention;  Surgeon: Peter M Martinique, MD;  Location: [place] [facility];  Service: Cardiovascular;  Laterality: N/A;  . CARDIAC CATHETERIZATION N/A 05/09/2016   Procedure: Intravascular Pressure Wire/FFR Study;  Surgeon: Peter M Martinique, MD;  Location: [place] [facility];  Service: Cardiovascular;  Laterality: N/A;  . LEFT HEART CATHETERIZATION WITH CORONARY ANGIOGRAM N/A 03/13/2011   Procedure: LEFT HEART CATHETERIZATION WITH CORONARY ANGIOGRAM;  Surgeon: Lorretta Harp, MD;  Location: [facility] C[facility];  Service: Cardiovascular;  Laterality: N/A;     Home Meds: Prior to Admission medications   Medication Sig Start Date End Date Taking? Authorizing Provider  aspirin 81 MG chewable tablet Chew 1 tablet (81 mg total) by mouth daily. 05/13/16   Shirley Friar, PA-C  digoxin (LANOXIN) 0.125 MG tablet Take 1 tablet (0.125 mg total) by mouth daily. 05/13/16   Legrand Como  Joesph July, PA-C  furosemide (LASIX) 40 MG tablet Take 1 tablet (40 mg total) by mouth daily. Take extra 40 mg as needed for weight gain of 3 lbs overnight or 5 lbs one week. 05/13/16   Shirley Friar, PA-C  isosorbide-hydrALAZINE (BIDIL)  20-37.5 MG tablet Take 0.5 tablets by mouth 3 times/day as needed-between meals & bedtime. 05/19/16   Shirley Friar, PA-C  ivabradine [facility]) 5 MG TABS tablet Take 0.5 tablets (2.5 mg total) by mouth 2 (two) times daily with a meal. 05/13/16   Shirley Friar, PA-C  Multiple Vitamins-Minerals (ADULT GUMMY) CHEW Chew 2 tablets by mouth daily.    Historical Provider, MD  nitroGLYCERIN (NITROSTAT) 0.4 MG SL tablet Place 1 tablet (0.4 mg total) under the tongue every 5 (five) minutes as needed for chest pain. Patient not taking: Reported on 05/02/2016 03/16/11 05/02/16  Belva Crome, MD  rosuvastatin (CRESTOR) 20 MG tablet Take 1 tablet (20 mg total) by mouth daily at 6 PM. 05/13/16   Shirley Friar, PA-C  spironolactone (ALDACTONE) 25 MG tablet Take 1 tablet (25 mg total) by mouth daily. 05/13/16   Shirley Friar, PA-C  ticagrelor (BRILINTA) 90 MG TABS tablet Take 1 tablet (90 mg total) by mouth 2 (two) times daily. 05/13/16   Shirley Friar, PA-C    Allergies:  Allergies  Allergen Reactions  . Plavix [Clopidogrel Bisulfate] Hives    Social History   Social History  . Marital status: Married    Spouse name: N/A  . Number of children: N/A  . Years of education: N/A   Occupational History  . Not on file.   Social History Main Topics  . Smoking status: Current Every Day Smoker    Packs/day: 0.20    Years: 30.00    Types: Cigars  . Smokeless tobacco: Never Used     Comment: Used to smoke cigarettes. Pt is currently smoking 3 black & mild cigars per day. He has cut down but never quit.  . Alcohol use No  . Drug use: No  . Sexual activity: Not on file   Other Topics Concern  . Not on file   Social History Narrative  . No narrative on file     Family History  Problem Relation Age of Onset  . Coronary artery disease Father   . Hypertension Father   . Hypertension Sister   . Coronary artery disease Brother   . Emphysema Brother   . Coronary  artery disease Mother   . Hypertension Mother   . Emphysema Mother   . Hypertension Daughter   . Obesity Daughter   . Diabetes    . Hypertension    . Coronary artery disease       ROS:  Please see the history of present illness.     All other systems reviewed and negative.    Physical Exam: Blood pressure 130/60, pulse 91, height 5\' 5"  (1.651 m), weight 152 lb 12.8 oz (69.3 kg), SpO2 98 %. General: Well developed, well nourished male in no acute distress. Head: Normocephalic, atraumatic, sclera non-icteric, no xanthomas, nares are without discharge. EENT: normal  Lymph Nodes:  none Neck: Negative for carotid bruits. JVD6. Back:without scoliosis kyphosis Lungs: Clear bilaterally to auscultation without wheezes, rales, or rhonchi. Breathing is unlabored. Heart:Irregular rate and rhythm with S1 S2.  2/6 systolic  Murmur notable with long RR intervals . No rubs, or gallops appreciated. Abdomen: Soft, non-tender, non-distended with normoactive bowel sounds. No hepatomegaly.  No rebound/guarding. No obvious abdominal masses. Msk:  Strength and tone appear normal for age. Extremities: No clubbing or cyanosis. No edema.  Distal pedal pulses are 2+ and equal bilaterally. Skin: Warm and Dry Neuro: Alert and oriented X 3. CN III-XII intact Grossly normal sensory and motor function . Psych:  Responds to questions appropriately with a normal affect.      Labs: Cardiac Enzymes No results for input(s): CKTOTAL, CKMB, TROPONINI in the last 72 hours. CBC Lab Results  Component Value Date   WBC 12.0 (H) 05/19/2016   HGB 13.2 05/19/2016   HCT 40.1 05/19/2016   MCV 82.5 05/19/2016   PLT 356 05/19/2016   PROTIME: No results for input(s): LABPROT, INR in the last 72 hours. Chemistry  Recent Labs Lab 05/19/16 1554  NA 136  K 3.9  CL 99*  CO2 26  BUN 14  CREATININE 1.43*  CALCIUM 10.1  GLUCOSE 87   Lipids Lab Results  Component Value Date   CHOL (H) 01/20/2008    267        ATP  III CLASSIFICATION:  <200     mg/dL   Desirable  200-239  mg/dL   Borderline High  >=240    mg/dL   High   HDL 29 (L) 01/20/2008   LDLCALC (H) 01/20/2008    173        Total Cholesterol/HDL:CHD Risk Coronary Heart Disease Risk Table                     Men   Women  1/2 Average Risk   3.4   3.3   TRIG 323 (H) 01/20/2008   BNP Pro B Natriuretic peptide (BNP)  Date/Time Value Ref Range Status  03/15/2011 05:00 AM 975.4 (H) 0 - 125 pg/mL Final  03/09/2009 04:45 AM 277.0 (H) 0.0 - 100.0 pg/mL Final  03/08/2009 03:55 AM 471.0 (H) 0.0 - 100.0 pg/mL Final  03/07/2009 04:30 AM 538.0 (H) 0.0 - 100.0 pg/mL Final   Thyroid Function Tests: No results for input(s): TSH, T4TOTAL, T3FREE, THYROIDAB in the last 72 hours.  Invalid input(s): FREET3 Miscellaneous No results found for: DDIMER  Radiology/Studies:  Dg Chest Port 1 View  Result Date: 04/30/2016 CLINICAL DATA:  Hypertension. Respiratory failure. Central line placement. EXAM: PORTABLE CHEST 1 VIEW COMPARISON:  04/30/2016 at 19:49 FINDINGS: New right jugular central line extends into the low SVC. Endotracheal tube is 5.8 cm above the carina. Nasogastric tube extends into the stomach. No pneumothorax. Unchanged cardiomegaly. Mild basilar airspace opacities, accentuated by a shallow degree of inspiration. IMPRESSION: 1. Support equipment appears satisfactorily positioned. New right IJ line extends into the low SVC. 2. No pneumothorax. 3. Stable cardiomegaly and mild basilar airspace opacities. Electronically Signed   By: Andreas Newport M.D.   On: 04/30/2016 21:56   Portable Chest Xray  Result Date: 04/30/2016 CLINICAL DATA:  Cardiorespiratory arrest.  Intubation. EXAM: PORTABLE CHEST 1 VIEW COMPARISON:  Chest x-rays earlier today 5:15 a.m., yesterday and previously. CTA chest yesterday. FINDINGS: Endotracheal tube tip in satisfactory position projecting approximately 7 cm above carina. Nasogastric tube courses below the diaphragm into the  stomach. External pacing pads are present. Cardiac silhouette markedly enlarged, unchanged. Interval resolution of the mild interstitial pulmonary edema and the atelectasis in the right lower lobe since this morning. Only minimal atelectasis persists at the right lung base. Lungs otherwise clear. IMPRESSION: 1. Support apparatus satisfactory. 2. Resolution of interstitial pulmonary edema since earlier today. 3. Improved aeration in  the right lower lobe since earlier today with only minimal atelectasis persisting. 4. No new abnormalities. Electronically Signed   By: Evangeline Dakin M.D.   On: 04/30/2016 20:05    EKG:SR with LBBB 15/15/45  Polymorphic PVCs are appreciated     Assessment and Plan:  Ischemic cardiomyopathy  Ventricular tachycardia-nonsustained and complex ventricular ectopy  Syncope/presyncope  Intermittent complete heart block-Hospital  Congestive heart failure-chronic-systolic class II   Left bundle branch block   The patient has ischemic cardiomyopathy for which he is taking medications regularly for only about one month. According to guidelines, it would normally be 3 months with reassessment of LV ejection fraction.  There are couple of challenges here. The other diagnostic issue is whether his ventricular ectopy is contributing to his cardiac myopathy and as such a rhythmic suppression is probably in order. I will discuss drug options with Dr. Reine Just. It may well be that this would improve LV function significantly. Of alternative and competing concern are his episodes of syncope and presyncope which are worrisome given his known nonsustained ventricular tachycardia and cardiac myopathy.  Hence, it may be reasonable to undertake electrophysiological testing to look for inducibility of sustained monomorphic ventricular tachycardia that would be ascribed to chronic substrate and as such would not be anticipated to improve with time. It would then be a secondary prevention  implantation.  Given the ectopy, we'll check his potassium and magnesium today.  Notably in the hospital he were normal  Volume status is euvolemic              Virl Axe

## 2016-05-24 NOTE — Telephone Encounter (Signed)
Medication Samples have been provided to the patient.  Drug name: Brilinta      Strength: 90 mg     Qty: 3 Bottles   LOT: [id number], [id number], [id number]  Exp.Date: 1/20, 3/20, 1/20  Dosing instructions: Take 1 Tablet by mouth two times daily  The patient has been instructed regarding the correct time, dose, and frequency of taking this medication, including desired effects and most common side effects.   Kennieth Rad 11:37 AM 05/24/2016

## 2016-05-25 ENCOUNTER — Telehealth (HOSPITAL_COMMUNITY): Payer: Self-pay | Admitting: *Deleted

## 2016-05-25 DIAGNOSIS — I130 Hypertensive heart and chronic kidney disease with heart failure and stage 1 through stage 4 chronic kidney disease, or unspecified chronic kidney disease: Secondary | ICD-10-CM | POA: Diagnosis not present

## 2016-05-25 DIAGNOSIS — Z48812 Encounter for surgical aftercare following surgery on the circulatory system: Secondary | ICD-10-CM | POA: Diagnosis not present

## 2016-05-25 LAB — BASIC METABOLIC PANEL
BUN / CREAT RATIO: 12 (ref 9–20)
BUN: 21 mg/dL (ref 6–24)
CHLORIDE: 96 mmol/L (ref 96–106)
CO2: 24 mmol/L (ref 18–29)
CREATININE: 1.77 mg/dL — AB (ref 0.76–1.27)
Calcium: 10 mg/dL (ref 8.7–10.2)
GFR calc Af Amer: 50 mL/min/{1.73_m2} — ABNORMAL LOW (ref >59)
GFR calc non Af Amer: 43 mL/min/{1.73_m2} — ABNORMAL LOW (ref >59)
GLUCOSE: 144 mg/dL — AB (ref 65–99)
Potassium: 4 mmol/L (ref 3.5–5.2)
SODIUM: 141 mmol/L (ref 134–144)

## 2016-05-25 LAB — MAGNESIUM: MAGNESIUM: 1.8 mg/dL (ref 1.6–2.3)

## 2016-05-25 NOTE — Telephone Encounter (Signed)
Windy with [facility] called asking for the following verbal orders for patient: -[facility] RN 2x's/week for 2 weeks -[facility] RN 1x/week for 4 weeks -Telehealth monitor to be placed in home when available.. -OT evaluation for ADLS  Verbal orders given with no further questions.

## 2016-05-26 ENCOUNTER — Telehealth: Payer: Self-pay | Admitting: Internal Medicine

## 2016-05-26 NOTE — Telephone Encounter (Signed)
I called and spoke with David [name] and advised her that I did speak with Dr. Caryl Comes earlier today about Mr. Seier. Per Dr. Caryl Comes, he has not been able to speak with Dr. Haroldine Laws yet, but I will advise her once this has taken place and what the plan is. She is agreeable.

## 2016-05-26 NOTE — Telephone Encounter (Signed)
Pt's wife calling to check on status of David [name] and [name] talking regarding next plan of treatment --pls call [phone]

## 2016-05-31 ENCOUNTER — Telehealth: Payer: Self-pay | Admitting: Internal Medicine

## 2016-05-31 NOTE — Telephone Encounter (Signed)
I called and spoke with the patient's wife. I advised her that per Dr. Caryl Comes, he had spoken with Dr. Haroldine Laws and he recommended Dr. Caryl Comes proceed as he felt best.  Orders received from Dr. Caryl Comes to schedule the patient for an EPS with 1) ICD is inducible sustained VT or 2) medication therapy if PVC's   I have notified the patient's wife of this and she voices understanding.  They are agreeable with being scheduled on 06/24/16 for an EPS. I will call his [place], Abigail Butts with [facility]- [phone] to discuss lab draw and see if she can facilitate the patient getting a surgical scrub in the [place] area.   I advised the patient's wife I will call back to confirm after speaking with Abigail Butts.

## 2016-05-31 NOTE — Telephone Encounter (Signed)
Howie Ill at 05/31/2016 4:10 PM   Status: Signed    New Message     Please call she would like to know what Dr Caryl Comes has decided to do with her husbands care, Did you speak with dr Haroldine Laws

## 2016-05-31 NOTE — Telephone Encounter (Signed)
New Message     Please call she would like to know what Dr Caryl Comes has decided to do with her husbands care, Did you speak with dr Haroldine Laws

## 2016-05-31 NOTE — Telephone Encounter (Signed)
Previous encounter opened for the same thing. Will close this encounter.

## 2016-06-02 ENCOUNTER — Other Ambulatory Visit (HOSPITAL_COMMUNITY): Payer: Self-pay | Admitting: *Deleted

## 2016-06-02 ENCOUNTER — Telehealth (HOSPITAL_COMMUNITY): Payer: Self-pay | Admitting: *Deleted

## 2016-06-02 NOTE — Telephone Encounter (Signed)
Home health RN called asking for verbal orders to continue to see patient once a week for 4 more weeks.  Verbal order given.

## 2016-06-03 ENCOUNTER — Telehealth (HOSPITAL_COMMUNITY): Payer: Self-pay | Admitting: *Deleted

## 2016-06-03 NOTE — Telephone Encounter (Signed)
Received fax from [place] that patient refused their home visit on 06/02/2016.

## 2016-06-09 ENCOUNTER — Telehealth: Payer: Self-pay | Admitting: Internal Medicine

## 2016-06-09 ENCOUNTER — Encounter: Payer: Self-pay | Admitting: *Deleted

## 2016-06-09 NOTE — Telephone Encounter (Signed)
I called and spoke with Mrs. Terrien to confirm the patient's procedure date/ time.  She is aware that this is scheduled for 06/24/16 at 7:30 am with Dr. Caryl Comes. Verbal instructions given and written instructions mailed to the patient.  He is aware that I have spoken with Abigail Butts from [facility] and she will draw the patient's labs.  He is also aware to go by [facility] to obtain Hibiclens scrub to use the night before and morning of his procedure.

## 2016-06-09 NOTE — Telephone Encounter (Signed)
I called and spoke with the patient's wife and answered her questions.

## 2016-06-09 NOTE — Telephone Encounter (Signed)
New Message   Pt wife called back, would like to speak with you again has more information for you

## 2016-06-13 LAB — PROTIME-INR

## 2016-06-14 ENCOUNTER — Telehealth (HOSPITAL_COMMUNITY): Payer: Self-pay | Admitting: Pharmacist

## 2016-06-14 NOTE — Telephone Encounter (Signed)
[place] for Brilinta coverage 2/2 the patient and his wife not having spent 3% of their annual income on medications in 2018 ($722). I spoke with the patient's wife who states that they will likely not meet that requirement anytime soon. Their copay for 1 month of Brilinta is $242 which they cannot afford. Will forward to Dr. Haroldine Laws to see if he would like to switch him to clopidogrel since he has taken a little over a month of Brilinta at this point.   Ruta Hinds. Velva Harman, PharmD, BCPS, CPP Clinical Pharmacist Pager: [phone] Phone: [phone] 06/14/2016 10:02 AM

## 2016-06-20 ENCOUNTER — Encounter (HOSPITAL_COMMUNITY): Payer: Medicare HMO | Admitting: Internal Medicine

## 2016-06-21 ENCOUNTER — Telehealth: Payer: Self-pay | Admitting: Physician Assistant

## 2016-06-21 ENCOUNTER — Telehealth: Payer: Self-pay | Admitting: Internal Medicine

## 2016-06-21 NOTE — Telephone Encounter (Signed)
Pt wife calling having some questions for procedure on Friday with Dr Caryl Comes If procedure goes well then we will put a device in patient.  1. They are having a benefit program on Sunday She would like to know will this be okay. There will be musicians there would like to just make sure  Please advise

## 2016-06-21 NOTE — Telephone Encounter (Signed)
Pt's wife, [name], who inquires if pt can attend a dinner benefit on Sunday after Friday's possible device implantation. Someone told her of a person who had an ICD and it shocked her when she got around music and instruments.  Per Nira Conn, RN, pt is cleared to attend the benefit. She verbalized understanding w/no further questions at this time.

## 2016-06-21 NOTE — Telephone Encounter (Signed)
     Wife called today because she is very concerned because David [name]'s HRs are in the 54s. Home health RN called them and told them to call their heart Dr. Deborha Payment are concerned this will interfere with his EP study planned for Friday. He denies any dizziness, syncope or weakness. He is otherwise feeling well.   He is on Ivabridine for CHF. I have recommended that he stop the Ivabridine for now. I will route my message to Dr. Haroldine Laws and Dr. Caryl Comes.   Angelena Form PA-C  [facility]

## 2016-06-24 ENCOUNTER — Ambulatory Visit (HOSPITAL_COMMUNITY)
Admission: RE | Admit: 2016-06-24 | Discharge: 2016-06-24 | Disposition: A | Discharge status: Home / Self Care | Payer: Medicare HMO | Source: Ambulatory Visit | Attending: Internal Medicine | Admitting: Internal Medicine

## 2016-06-24 ENCOUNTER — Encounter (HOSPITAL_COMMUNITY)
Admission: RE | Disposition: A | Discharge status: Home / Self Care | Payer: Self-pay | Source: Ambulatory Visit | Attending: Internal Medicine

## 2016-06-24 ENCOUNTER — Encounter (HOSPITAL_COMMUNITY): Payer: Self-pay | Admitting: *Deleted

## 2016-06-24 DIAGNOSIS — Z8249 Family history of ischemic heart disease and other diseases of the circulatory system: Secondary | ICD-10-CM | POA: Diagnosis not present

## 2016-06-24 DIAGNOSIS — Z955 Presence of coronary angioplasty implant and graft: Secondary | ICD-10-CM | POA: Diagnosis not present

## 2016-06-24 DIAGNOSIS — F17210 Nicotine dependence, cigarettes, uncomplicated: Secondary | ICD-10-CM | POA: Diagnosis not present

## 2016-06-24 DIAGNOSIS — I472 Ventricular tachycardia: Secondary | ICD-10-CM | POA: Insufficient documentation

## 2016-06-24 DIAGNOSIS — I509 Heart failure, unspecified: Secondary | ICD-10-CM | POA: Diagnosis not present

## 2016-06-24 DIAGNOSIS — E785 Hyperlipidemia, unspecified: Secondary | ICD-10-CM | POA: Insufficient documentation

## 2016-06-24 DIAGNOSIS — I493 Ventricular premature depolarization: Secondary | ICD-10-CM | POA: Diagnosis not present

## 2016-06-24 DIAGNOSIS — I252 Old myocardial infarction: Secondary | ICD-10-CM | POA: Diagnosis not present

## 2016-06-24 DIAGNOSIS — I110 Hypertensive heart disease with heart failure: Secondary | ICD-10-CM | POA: Diagnosis not present

## 2016-06-24 DIAGNOSIS — I2510 Atherosclerotic heart disease of native coronary artery without angina pectoris: Secondary | ICD-10-CM | POA: Diagnosis not present

## 2016-06-24 DIAGNOSIS — R55 Syncope and collapse: Secondary | ICD-10-CM

## 2016-06-24 DIAGNOSIS — I255 Ischemic cardiomyopathy: Secondary | ICD-10-CM | POA: Diagnosis not present

## 2016-06-24 DIAGNOSIS — Z7982 Long term (current) use of aspirin: Secondary | ICD-10-CM | POA: Insufficient documentation

## 2016-06-24 HISTORY — PX: ICD IMPLANT: EP1208

## 2016-06-24 HISTORY — PX: ELECTROPHYSIOLOGY STUDY: EP1205

## 2016-06-24 LAB — SURGICAL PCR SCREEN
MRSA, PCR: NEGATIVE
Staphylococcus aureus: NEGATIVE

## 2016-06-24 SURGERY — ELECTROPHYSIOLOGY STUDY

## 2016-06-24 MED ORDER — MIDAZOLAM HCL 5 MG/5ML IJ SOLN
INTRAMUSCULAR | Status: AC
  Filled 2016-06-24: qty 5

## 2016-06-24 MED ORDER — FENTANYL CITRATE (PF) 100 MCG/2ML IJ SOLN
INTRAMUSCULAR | Status: AC
  Filled 2016-06-24: qty 2

## 2016-06-24 MED ORDER — CHLORHEXIDINE GLUCONATE 4 % EX LIQD: 60.0000 mL | Freq: Once | CUTANEOUS | Status: DC

## 2016-06-24 MED ORDER — SODIUM CHLORIDE 0.45 % IV SOLN
INTRAVENOUS | Status: DC
  Administered 2016-06-24: 06:00:00 via INTRAVENOUS

## 2016-06-24 MED ORDER — GENTAMICIN SULFATE 40 MG/ML IJ SOLN: 80.0000 mg | INTRAMUSCULAR | Status: DC

## 2016-06-24 MED ORDER — FENTANYL CITRATE (PF) 100 MCG/2ML IJ SOLN
INTRAMUSCULAR | Status: DC | PRN
  Administered 2016-06-24: 25 ug via INTRAVENOUS
  Administered 2016-06-24: 50 ug via INTRAVENOUS
  Administered 2016-06-24: 25 ug via INTRAVENOUS

## 2016-06-24 MED ORDER — BUPIVACAINE HCL (PF) 0.25 % IJ SOLN
INTRAMUSCULAR | Status: DC | PRN
  Administered 2016-06-24: 40 mL

## 2016-06-24 MED ORDER — MUPIROCIN 2 % EX OINT
TOPICAL_OINTMENT | CUTANEOUS | Status: AC
  Filled 2016-06-24: qty 22

## 2016-06-24 MED ORDER — SODIUM CHLORIDE 0.9% FLUSH: 3.0000 mL | Freq: Two times a day (BID) | INTRAVENOUS | Status: DC

## 2016-06-24 MED ORDER — HEPARIN (PORCINE) IN NACL 2-0.9 UNIT/ML-% IJ SOLN
INTRAMUSCULAR | Status: AC
  Filled 2016-06-24: qty 500

## 2016-06-24 MED ORDER — SODIUM CHLORIDE 0.9 % IV SOLN
INTRAVENOUS | Status: DC
  Administered 2016-06-24: 06:00:00 via INTRAVENOUS

## 2016-06-24 MED ORDER — CEFAZOLIN SODIUM-DEXTROSE 2-4 GM/100ML-% IV SOLN: 2.0000 g | INTRAVENOUS | Status: DC

## 2016-06-24 MED ORDER — HEPARIN (PORCINE) IN NACL 2-0.9 UNIT/ML-% IJ SOLN
INTRAMUSCULAR | Status: DC | PRN
  Administered 2016-06-24: 08:00:00

## 2016-06-24 MED ORDER — SODIUM CHLORIDE 0.9% FLUSH: 3.0000 mL | INTRAVENOUS | Status: DC | PRN

## 2016-06-24 MED ORDER — MIDAZOLAM HCL 5 MG/5ML IJ SOLN
INTRAMUSCULAR | Status: DC | PRN
  Administered 2016-06-24: 2 mg via INTRAVENOUS
  Administered 2016-06-24 (×2): 1 mg via INTRAVENOUS
  Administered 2016-06-24: 2 mg via INTRAVENOUS

## 2016-06-24 MED ORDER — SODIUM CHLORIDE 0.9 % IV SOLN: 250.0000 mL | INTRAVENOUS | Status: DC | PRN

## 2016-06-24 MED ORDER — MUPIROCIN 2 % EX OINT
TOPICAL_OINTMENT | Freq: Once | CUTANEOUS | Status: AC
  Administered 2016-06-24: 1 via NASAL

## 2016-06-24 MED ORDER — MEXILETINE HCL 200 MG PO CAPS: 200.0000 mg | ORAL_CAPSULE | Freq: Two times a day (BID) | ORAL | 3 refills | Status: DC

## 2016-06-24 MED ORDER — BUPIVACAINE HCL (PF) 0.25 % IJ SOLN
INTRAMUSCULAR | Status: AC
  Filled 2016-06-24: qty 60

## 2016-06-24 SURGICAL SUPPLY — 7 items
BAG SNAP BAND KOVER 36X36 (MISCELLANEOUS) ×3 IMPLANT
CATH QUAD COURNAND 5FR REPROC (CATHETERS) ×3 IMPLANT
CATH QUAD JOSEPHSON 5FR REPROC (CATHETERS) ×3 IMPLANT
PACK EP LATEX FREE (CUSTOM PROCEDURE TRAY) ×2
PACK EP LF (CUSTOM PROCEDURE TRAY) ×1 IMPLANT
PAD DEFIB LIFELINK (PAD) ×3 IMPLANT
SHEATH PINNACLE 6F 10CM (SHEATH) ×6 IMPLANT

## 2016-06-24 NOTE — Discharge Instructions (Signed)

## 2016-06-24 NOTE — Progress Notes (Signed)
Site area: right groin 2 fv sheaths Site Prior to Removal:  Level 0 Pressure Applied For: 15 minutes Manual:   yes Patient Status During Pull:  stable Post Pull Site:  Level 0 Post Pull Instructions Given:  yes Post Pull Pulses Present: palpable Dressing Applied:  Gauze and tegaderm Bedrest begins @ 0945 Comments: IV locked

## 2016-06-24 NOTE — H&P (Signed)
Patient Care Team: Raina Mina, MD as PCP - General (Internal Medicine)   HPI  David [name] is a 51 y.o. male Admitted for EPS for syncope with underlying ischemic cardiomyopathy. He is now compliant with medications No chest pain, no change in functional status; no edema  depressed LV function for more than 5 years (20% or so) prior PCI 2009 in 2010 2012; He acknowledges that he was taking medications not reliably  He had been largely noncompliant with medications following up with cardiology in [facility]   January  2018 hospitalized in transfer from [facility] where he had presented with atrial fibrillation w rapid rate and was treated with amiodarone. He developed bradycardia and suffered a (cardiac/bradycardia versus PE) arrest  Underwent cath >>stenting 3 including  LAD RCA and posterolateral branch. Ejection fraction was 25%.  He was treated with ticagrelor and aspirin.  Concern from wife on messages re bradycardia-- almost certainly bigeminy, noted previously and again today     Past Medical History:  Diagnosis Date  . CAD (coronary artery disease) 2009   AMI in 12/2007 with PCI to LAD, staged PCI to  mid/distal RCA, NSTEMI in 02/2009 with BMS to LCx  . CHF (congestive heart failure) ([place])   . HLD (hyperlipidemia)   . HTN (hypertension)   . Ischemic cardiomyopathy    Admitted in 07/2010 with CHF exacerbation    Past Surgical History:  Procedure Laterality Date  . CARDIAC CATHETERIZATION N/A 05/05/2016   Procedure: Right/Left Heart Cath and Coronary Angiography;  Surgeon: Jolaine Artist, MD;  Location: [place] [facility];  Service: Cardiovascular;  Laterality: N/A;  . CARDIAC CATHETERIZATION N/A 05/09/2016   Procedure: Coronary Stent Intervention;  Surgeon: Peter M Martinique, MD;  Location: [place] [facility];  Service: Cardiovascular;  Laterality: N/A;  . CARDIAC CATHETERIZATION N/A 05/09/2016   Procedure: Intravascular Pressure Wire/FFR Study;   Surgeon: Peter M Martinique, MD;  Location: [place] [facility];  Service: Cardiovascular;  Laterality: N/A;  . LEFT HEART CATHETERIZATION WITH CORONARY ANGIOGRAM N/A 03/13/2011   Procedure: LEFT HEART CATHETERIZATION WITH CORONARY ANGIOGRAM;  Surgeon: Lorretta Harp, MD;  Location: [facility] CATH LAB;  Service: Cardiovascular;  Laterality: N/A;    Current Facility-Administered Medications  Medication Dose Route Frequency Provider Last Rate Last Dose  . 0.45 % sodium chloride infusion   Intravenous Continuous Patsey Berthold, NP 50 mL/hr at 06/24/16 0622    . 0.9 %  sodium chloride infusion   Intravenous Continuous Patsey Berthold, NP 50 mL/hr at 06/24/16 0622    . ceFAZolin (ANCEF) IVPB 2g/100 mL premix  2 g Intravenous On Call Amber Sena Slate, NP      . chlorhexidine (HIBICLENS) 4 % liquid 4 application  60 mL Topical Once Amber Sena Slate, NP      . gentamicin (GARAMYCIN) 80 mg in sodium chloride irrigation 0.9 % 500 mL irrigation  80 mg Irrigation On Call Patsey Berthold, NP      . mupirocin ointment (BACTROBAN) 2 %             Allergies  Allergen Reactions  . Plavix [Clopidogrel Bisulfate] Hives      Social History  Substance Use Topics  . Smoking status: Current Every Day Smoker    Packs/day: 0.20    Years: 30.00    Types: Cigars  . Smokeless tobacco: Never Used     Comment: Used to smoke cigarettes. Pt is currently smoking 3 black &  mild cigars per day. He has cut down but never quit.  . Alcohol use No     Family History  Problem Relation Age of Onset  . Coronary artery disease Father   . Hypertension Father   . Hypertension Sister   . Coronary artery disease Brother   . Emphysema Brother   . Coronary artery disease Mother   . Hypertension Mother   . Emphysema Mother   . Hypertension Daughter   . Obesity Daughter   . Diabetes    . Hypertension    . Coronary artery disease       No current facility-administered medications on file prior to encounter.    Current Outpatient  Prescriptions on File Prior to Encounter  Medication Sig Dispense Refill  . aspirin 81 MG chewable tablet Chew 1 tablet (81 mg total) by mouth daily. 30 tablet 6  . digoxin (LANOXIN) 0.125 MG tablet Take 1 tablet (0.125 mg total) by mouth daily. 30 tablet 6  . furosemide (LASIX) 40 MG tablet Take 1 tablet (40 mg total) by mouth daily. Take extra 40 mg as needed for weight gain of 3 lbs overnight or 5 lbs one week. 40 tablet 6  . isosorbide-hydrALAZINE (BIDIL) 20-37.5 MG tablet Take 0.5 tablets by mouth 3 times/day as needed-between meals & bedtime. (Patient taking differently: Take 0.5 tablets by mouth 2 (two) times daily. ) 30 tablet 6  . ivabradine (CORLANOR) 5 MG TABS tablet Take 0.5 tablets (2.5 mg total) by mouth 2 (two) times daily with a meal. 60 tablet 6  . Multiple Vitamins-Minerals (ADULT GUMMY) CHEW Chew 2 tablets by mouth daily.    . rosuvastatin (CRESTOR) 20 MG tablet Take 1 tablet (20 mg total) by mouth daily at 6 PM. 30 tablet 6  . spironolactone (ALDACTONE) 25 MG tablet Take 1 tablet (25 mg total) by mouth daily. 30 tablet 6  . ticagrelor (BRILINTA) 90 MG TABS tablet Take 1 tablet (90 mg total) by mouth 2 (two) times daily. 60 tablet 6      Review of Systems negative except from HPI and PMH  Physical Exam BP (!) 155/63   Pulse (!) 43   Temp 97.8 F (36.6 C) (Oral)   Resp 18   Ht 5\' 5"  (1.651 m)   Wt 154 lb (69.9 kg)   SpO2 97%   BMI 25.63 kg/m  Well developed and well nourished in no acute distress HENT normal E scleral and icterus clear Neck Supple JVP flat; carotids brisk and full Clear to ausculation Irregularity but mostly Regular rate and rhythm, no murmurs gallops or rub Soft with active bowel sounds No clubbing cyanosis   Edema Alert and oriented, grossly normal motor and sensory function Skin Warm and Dry    Assessment and  Plan  Cardiomyopathy--ischemic   Syncope  Hypertension   PVCs  Amiodarone ?? Bradycardic arrest  At [facility]   Given poor medical compliance undertaking EPS for risk stratification given syncope.  If inducible will get ICD for secondary prevention.    Have reviewed the potential benefits and risks of ICD implantation including but not limited to death, perforation of heart or lung, lead dislodgement, infection,  device malfunction and inappropriate shocks.  The patient express understanding  and are willing to proceed.     PVCs may be contributing to cardiomyopathy  --will add mexilitene for PVC suppression

## 2016-06-27 ENCOUNTER — Telehealth: Payer: Self-pay | Admitting: Internal Medicine

## 2016-06-27 MED FILL — Fentanyl Citrate Preservative Free (PF) Inj 100 MCG/2ML: INTRAMUSCULAR | Qty: 2 | Status: AC

## 2016-06-27 NOTE — Op Note (Signed)
NAME:  DEXTON, ZWILLING NO.:  [id number]  MEDICAL RECORD NO.:  [id number]  LOCATION:                                 FACILITY:  PHYSICIAN:  Deboraha Sprang, MD, FACCDATE OF BIRTH:  June 27, 1965  DATE OF PROCEDURE:  06/24/2016 DATE OF DISCHARGE:                              OPERATIVE REPORT   PREOPERATIVE DIAGNOSES:  Ischemic cardiomyopathy and syncope.  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  PROCEDURES:  Invasive electrophysiological study.  DESCRIPTION OF PROCEDURE:  Following obtaining informed consent, the patient was brought to the electrophysiology laboratory and placed on the fluoroscopic table in supine position.  After routine prep and drape, cardiac catheterization was performed with local anesthesia and conscious sedation.  Noninvasive blood pressure monitoring and transcutaneous oxygen saturation monitoring were performed continuously throughout the procedure.  Following the procedure, the catheters were removed.  Hemostasis was obtained.  The patient was transferred to the holding area for removal of sheath.  Catheters; 1. A 5-French quadripolar catheter was inserted via right femoral vein     to the right ventricular apex and moved to the right ventricular     outflow tract and then to the right atrium. 2. A 5-French quadripolar catheter was inserted via the right femoral     vein to the AV junction to measure his electrogram.  Following insertion of the catheters, a stimulation protocol included; 1. Incremental atrial pacing. 2. Incremental ventricular pacing. 3. Single atrial extrastimuli at paced cycle length of 500     milliseconds. 4. Single, double, and triple ventricular extrastimuli at paced cycle     length of 400:550 milliseconds and double and triple ventricular     extrastimuli from right ventricular outflow tract at a paced cycle     length of 400 milliseconds.  END-TIDAL BASIC INTERVALS AND  SURFACE ELECTROCARDIOGRAM:  Initial rhythm:  Sinus with frequent ventricular ectopy. Cycle length is 699 millisecond. PR interval is 238 milliseconds. P-wave duration is 124 millisecond. QRS duration is 131 milliseconds. QT interval is 360 milliseconds. The AH interval was 124 millisecond and the HV interval was 40 milliseconds.  Final rhythm:  Sinus with PVCs. PR interval 230 milliseconds. P-wave duration 121 milliseconds. QRS duration 137 milliseconds. QT interval 395 milliseconds. HV interval 40 milliseconds.  END-TIDAL AV NODAL FUNCTION:  AV Wenckebach was seen at 400 milliseconds. Atrial effective refractory period of 500 milliseconds was 340 milliseconds. VA conduction was dissociated. No evidence of dual AV nodal physiology was identified.  ACCESSORY PATHWAY FUNCTION:  No evidence of accessory pathway was identified.  RESPONSE TO PROGRAMMED STIMULATION:  No inducible ventricular tachycardia was seen.  Ventricular fibrillation was induced with double ventricular extrastimuli at 400:550 milliseconds with an S4 of 210 milliseconds.  Nothing closer than 220 milliseconds was thereafter attempted.  Ventricular flutter was also induced with triple ventricular extrastimuli with a cycle length of 230 milliseconds.  IMPRESSION: 1. Normal sinus function. 2. Normal atrial function. 3. Normal atrioventricular nodal function. 4. Normal His-Purkinje system function. 5. No accessory pathway. 6. Ventricular stimulation resulted in ventricular fibrillation that     was self terminating after about 10 seconds and ventricular flutter     that  required cardioversion/defibrillation.  SUMMARY:  These are nonspecific findings with triple ventricular extrastimuli and as such, this EP study is considered negative.  We will continue him on medical therapy for his ischemic cardiomyopathy.  Given his complex and frequent ventricular ectopy, we will begin him on antiarrhythmic therapy.  He  has a history of a reaction to amiodarone, which is unlikely to be causal.  However, given the concerns, we will attempt to use with mexiletine and ranolazine if necessary for ventricular ectopy suppression.  Following suppression of ectopy and 3 months of guideline directed medical therapy, if ejection fraction remains poor, ICD implantation will be considered.     Deboraha Sprang, MD, [place]   ______________________________ Deboraha Sprang, MD, [facility]    SCK/MEDQ  D:  06/24/2016  T:  06/25/2016  Job:  [id number]

## 2016-06-27 NOTE — Telephone Encounter (Signed)
I spoke with the pt's wife and made her aware that Dr Caryl Comes sent in Rx for mexiletine 200mg  take one capsule by mouth twice a day on 06/24/16.  She will contact the office if she has any issues in regards to picking up this prescription.

## 2016-06-27 NOTE — Telephone Encounter (Signed)
New message    Pt wife is calling. She states pt had procedure on Friday and pt was going to have a prescription called in but they were unsure if they could get it at the time. She is calling asking to call that medication in, she can not remember the name. She would like a call back when it gets called in.

## 2016-07-12 ENCOUNTER — Telehealth: Payer: Self-pay | Admitting: Internal Medicine

## 2016-07-12 ENCOUNTER — Encounter: Payer: Self-pay | Admitting: Internal Medicine

## 2016-07-12 NOTE — Telephone Encounter (Signed)
New Message   *STAT* If patient is at the pharmacy, call can be transferred to refill team.   1. Which medications need to be refilled? (please list name of each medication and dose if known) brilinta 90 mg tablet twice daily  2. Which pharmacy/location (including street and city if local pharmacy) is medication to be sent to?[facility], [address], [place], [place]  3. Do they need a 30 day or 90 day supply?

## 2016-07-19 ENCOUNTER — Ambulatory Visit (INDEPENDENT_AMBULATORY_CARE_PROVIDER_SITE_OTHER): Payer: Medicare HMO | Admitting: Internal Medicine

## 2016-07-19 ENCOUNTER — Encounter (INDEPENDENT_AMBULATORY_CARE_PROVIDER_SITE_OTHER): Payer: Self-pay

## 2016-07-19 ENCOUNTER — Encounter: Payer: Self-pay | Admitting: Internal Medicine

## 2016-07-19 VITALS — BP 120/74 | HR 86 | Ht 65.0 in | Wt 153.6 lb

## 2016-07-19 DIAGNOSIS — I472 Ventricular tachycardia, unspecified: Secondary | ICD-10-CM

## 2016-07-19 DIAGNOSIS — I255 Ischemic cardiomyopathy: Secondary | ICD-10-CM | POA: Diagnosis not present

## 2016-07-19 DIAGNOSIS — I447 Left bundle-branch block, unspecified: Secondary | ICD-10-CM

## 2016-07-19 DIAGNOSIS — I5022 Chronic systolic (congestive) heart failure: Secondary | ICD-10-CM

## 2016-07-19 DIAGNOSIS — I493 Ventricular premature depolarization: Secondary | ICD-10-CM

## 2016-07-19 MED ORDER — AMIODARONE HCL 200 MG PO TABS: 200.0000 mg | ORAL_TABLET | Freq: Two times a day (BID) | ORAL | 6 refills | Status: DC

## 2016-07-19 MED ORDER — DIGOXIN 125 MCG PO TABS: 0.1250 mg | ORAL_TABLET | ORAL | Status: DC

## 2016-07-19 NOTE — Patient Instructions (Addendum)
Medication Instructions: - Your physician has recommended you make the following change in your medication: 1) Stop mexiletine 2) Start amiodarone 200 mg- take one tablet by mouth twice daily 3) Decrease digoxin 0.125 mg- take one tablet by mouth once every other day  Labwork: - none ordered  Procedures/Testing: - Your physician has requested that you have an echocardiogram- within the next 2 weeks  Echocardiography is a painless test that uses sound waves to create images of your heart. It provides your doctor with information about the size and shape of your heart and how well your heart's chambers and valves are working. This procedure takes approximately one hour. There are no restrictions for this procedure.  - Your physician has requested that you have an echocardiogram- in 8 weeks (same day as holter) . Echocardiography is a painless test that uses sound waves to create images of your heart. It provides your doctor with information about the size and shape of your heart and how well your heart's chambers and valves are working. This procedure takes approximately one hour. There are no restrictions for this procedure.  - Your physician has recommended that you wear a 24 hour holter monitor- in 8 weeks. Holter monitors are medical devices that record the heart's electrical activity. Doctors most often use these monitors to diagnose arrhythmias. Arrhythmias are problems with the speed or rhythm of the heartbeat. The monitor is a small, portable device. You can wear one while you do your normal daily activities. This is usually used to diagnose what is causing palpitations/syncope (passing out).  Follow-Up: - Your physician recommends that you schedule a follow-up appointment in: 10 weeks with Dr. Caryl Comes.  Any Additional Special Instructions Will Be Listed Below (If Applicable).     If you need a refill on your cardiac medications before your next appointment, please call your pharmacy.

## 2016-07-19 NOTE — Progress Notes (Signed)
Patient Care Team: Raina Mina, MD as PCP - General (Internal Medicine)   HPI  David [name] is a 51 y.o. male Seen in follow-up for left ventricular dysfunction for which she had been noncompliant with medications until early January. At that time he had presented with atrial fibrillation with rapid rates and suffered a PEA arrest. [facility]   He underwent catheterization with stenting. Ejection fraction 25%. He also had nonsustained ventricular tachycardia and underwent testing 3/18  which was negative.   Because of complex ventricular ectopy, it was recommended that we initiate antiarrhythmic therapy. There were concerns initially about amiodarone given his temporal association with the aforementioned PEA arrest. However, after discussions with Dr. Reine Just, it was felt that it would be a reasonable alternative as mexiletine  mexiletine was started and tolerated. Today he still has 16% PVCs.  He feels good with mild as of breath but no chest pain   Records and Results Reviewed *  Past Medical History:  Diagnosis Date  . CAD (coronary artery disease) 2009   AMI in 12/2007 with PCI to LAD, staged PCI to  mid/distal RCA, NSTEMI in 02/2009 with BMS to LCx  . CHF (congestive heart failure) ([place])   . HLD (hyperlipidemia)   . HTN (hypertension)   . Ischemic cardiomyopathy    Admitted in 07/2010 with CHF exacerbation    Past Surgical History:  Procedure Laterality Date  . CARDIAC CATHETERIZATION N/A 05/05/2016   Procedure: Right/Left Heart Cath and Coronary Angiography;  Surgeon: Jolaine Artist, MD;  Location: [place] [facility];  Service: Cardiovascular;  Laterality: N/A;  . CARDIAC CATHETERIZATION N/A 05/09/2016   Procedure: Coronary Stent Intervention;  Surgeon: Peter M Martinique, MD;  Location: [place] [facility];  Service: Cardiovascular;  Laterality: N/A;  . CARDIAC CATHETERIZATION N/A 05/09/2016   Procedure: Intravascular Pressure Wire/FFR Study;  Surgeon:  Peter M Martinique, MD;  Location: [place] [facility];  Service: Cardiovascular;  Laterality: N/A;  . ELECTROPHYSIOLOGY STUDY N/A 06/24/2016   Procedure: Electrophysiology Study;  Surgeon: Deboraha Sprang, MD;  Location: [place] [facility];  Service: Cardiovascular;  Laterality: N/A;  . ICD IMPLANT N/A 06/24/2016   Procedure: POSSIBLE  ICD Implant;  Surgeon: Deboraha Sprang, MD;  Location: [place] [facility];  Service: Cardiovascular;  Laterality: N/A;  . LEFT HEART CATHETERIZATION WITH CORONARY ANGIOGRAM N/A 03/13/2011   Procedure: LEFT HEART CATHETERIZATION WITH CORONARY ANGIOGRAM;  Surgeon: Lorretta Harp, MD;  Location: [facility] CATH LAB;  Service: Cardiovascular;  Laterality: N/A;    Current Outpatient Prescriptions  Medication Sig Dispense Refill  . aspirin 81 MG chewable tablet Chew 1 tablet (81 mg total) by mouth daily. 30 tablet 6  . digoxin (LANOXIN) 0.125 MG tablet Take 1 tablet (0.125 mg total) by mouth daily. 30 tablet 6  . diphenhydrAMINE (BENADRYL) 25 MG tablet Take 12.5 mg by mouth every 6 (six) hours as needed for itching or allergies.    . diphenhydrAMINE-zinc acetate (BENADRYL) cream Apply 1 application topically daily as needed for itching.    . furosemide (LASIX) 40 MG tablet Take 1 tablet (40 mg total) by mouth daily. Take extra 40 mg as needed for weight gain of 3 lbs overnight or 5 lbs one week. 40 tablet 6  . isosorbide-hydrALAZINE (BIDIL) 20-37.5 MG tablet Take 0.5 tablets by mouth 3 times/day as needed-between meals & bedtime. (Patient taking differently: Take 0.5 tablets by mouth 2 (two) times daily. ) 30  tablet 6  . ivabradine (CORLANOR) 5 MG TABS tablet Take 0.5 tablets (2.5 mg total) by mouth 2 (two) times daily with a meal. 60 tablet 6  . mexiletine (MEXITIL) 200 MG capsule Take 1 capsule (200 mg total) by mouth 2 (two) times daily. 60 capsule 3  . Multiple Vitamins-Minerals (ADULT GUMMY) CHEW Chew 2 tablets by mouth daily.    . rosuvastatin (CRESTOR) 20 MG tablet Take 1 tablet  (20 mg total) by mouth daily at 6 PM. 30 tablet 6  . spironolactone (ALDACTONE) 25 MG tablet Take 1 tablet (25 mg total) by mouth daily. 30 tablet 6  . ticagrelor (BRILINTA) 90 MG TABS tablet Take 1 tablet (90 mg total) by mouth 2 (two) times daily. 60 tablet 6   No current facility-administered medications for this visit.     Allergies  Allergen Reactions  . Plavix [Clopidogrel Bisulfate] Hives      Review of Systems negative except from HPI and PMH  Physical Exam BP 120/74 (BP Location: Right Arm, Patient Position: Sitting, Cuff Size: Normal)   Pulse 86   Ht 5\' 5"  (1.651 m)   Wt 153 lb 9.6 oz (69.7 kg)   SpO2 97%   BMI 25.56 kg/m  Well developed and well nourished in no acute distress HENT normal E scleral and icterus clear Neck Supple JVP flat; carotids brisk and full Clear to ausculation  Regular rate and rhythm, no murmurs gallops or rub Soft with active bowel sounds No clubbing cyanosis  Edema Alert and oriented, grossly normal motor and sensory function Skin Warm and Dry  Sinus rhythm at 80 Intervals 21/14/41 IVCD PVCs 1/6 right bundle superior axis  Assessment and  Plan Ischemic cardiomyopathy  PVCs  Nonsustained ventricular tachycardia-noninducible  Congestive heart failure-chronic-systolic   The patient is euvolemic.  I've been in touch with Dr. Reine Just as to whether we should initiate carvedilol for we are both in agreement that we should try once again a time. We will begin him on amiodarone for his frequent ventricular ectopy discontinuing his mexiletine. We have reviewed side effects.  We will plan an echocardiogram at the initiation so as to be able to distinguish the effects of his medical therapy here today to an effective elimination of his PVCs. We will assess him a Holter monitor in about 8 weeks with a repeat echo.  Reviewed med plan with Dr Reine Just, Will initiate with drug at a time and so we've the amiodarone now would hold off on the  carvedilol  More than 50% of 45 min was spent in counseling related to the above        Current medicines are reviewed at length with the patient today .  The patient does not have concerns regarding medicines.

## 2016-07-27 ENCOUNTER — Inpatient Hospital Stay (HOSPITAL_COMMUNITY)
Admission: EM | Admit: 2016-07-27 | Discharge: 2016-08-02 | DRG: 226 | Disposition: A | Discharge status: Home / Self Care | Payer: Medicare HMO | Attending: Internal Medicine | Admitting: Internal Medicine

## 2016-07-27 ENCOUNTER — Encounter (HOSPITAL_COMMUNITY): Payer: Self-pay | Admitting: Emergency Medicine

## 2016-07-27 ENCOUNTER — Emergency Department (HOSPITAL_COMMUNITY): Payer: Medicare HMO

## 2016-07-27 DIAGNOSIS — I4729 Other ventricular tachycardia: Secondary | ICD-10-CM

## 2016-07-27 DIAGNOSIS — N179 Acute kidney failure, unspecified: Secondary | ICD-10-CM | POA: Diagnosis not present

## 2016-07-27 DIAGNOSIS — I252 Old myocardial infarction: Secondary | ICD-10-CM

## 2016-07-27 DIAGNOSIS — I472 Ventricular tachycardia, unspecified: Secondary | ICD-10-CM

## 2016-07-27 DIAGNOSIS — I480 Paroxysmal atrial fibrillation: Secondary | ICD-10-CM | POA: Diagnosis present

## 2016-07-27 DIAGNOSIS — D72829 Elevated white blood cell count, unspecified: Secondary | ICD-10-CM | POA: Diagnosis present

## 2016-07-27 DIAGNOSIS — I440 Atrioventricular block, first degree: Secondary | ICD-10-CM | POA: Diagnosis present

## 2016-07-27 DIAGNOSIS — Z7982 Long term (current) use of aspirin: Secondary | ICD-10-CM

## 2016-07-27 DIAGNOSIS — I10 Essential (primary) hypertension: Secondary | ICD-10-CM | POA: Diagnosis present

## 2016-07-27 DIAGNOSIS — I2510 Atherosclerotic heart disease of native coronary artery without angina pectoris: Secondary | ICD-10-CM | POA: Diagnosis not present

## 2016-07-27 DIAGNOSIS — R079 Chest pain, unspecified: Secondary | ICD-10-CM | POA: Diagnosis not present

## 2016-07-27 DIAGNOSIS — I248 Other forms of acute ischemic heart disease: Secondary | ICD-10-CM | POA: Diagnosis present

## 2016-07-27 DIAGNOSIS — I447 Left bundle-branch block, unspecified: Secondary | ICD-10-CM | POA: Diagnosis present

## 2016-07-27 DIAGNOSIS — I130 Hypertensive heart and chronic kidney disease with heart failure and stage 1 through stage 4 chronic kidney disease, or unspecified chronic kidney disease: Secondary | ICD-10-CM | POA: Diagnosis present

## 2016-07-27 DIAGNOSIS — I5023 Acute on chronic systolic (congestive) heart failure: Secondary | ICD-10-CM | POA: Diagnosis present

## 2016-07-27 DIAGNOSIS — I493 Ventricular premature depolarization: Secondary | ICD-10-CM | POA: Diagnosis present

## 2016-07-27 DIAGNOSIS — E785 Hyperlipidemia, unspecified: Secondary | ICD-10-CM | POA: Diagnosis present

## 2016-07-27 DIAGNOSIS — F17290 Nicotine dependence, other tobacco product, uncomplicated: Secondary | ICD-10-CM | POA: Diagnosis present

## 2016-07-27 DIAGNOSIS — Z825 Family history of asthma and other chronic lower respiratory diseases: Secondary | ICD-10-CM

## 2016-07-27 DIAGNOSIS — I5022 Chronic systolic (congestive) heart failure: Secondary | ICD-10-CM | POA: Diagnosis present

## 2016-07-27 DIAGNOSIS — Z79899 Other long term (current) drug therapy: Secondary | ICD-10-CM

## 2016-07-27 DIAGNOSIS — N182 Chronic kidney disease, stage 2 (mild): Secondary | ICD-10-CM | POA: Diagnosis not present

## 2016-07-27 DIAGNOSIS — Z955 Presence of coronary angioplasty implant and graft: Secondary | ICD-10-CM

## 2016-07-27 DIAGNOSIS — Z95818 Presence of other cardiac implants and grafts: Secondary | ICD-10-CM

## 2016-07-27 DIAGNOSIS — E876 Hypokalemia: Secondary | ICD-10-CM | POA: Diagnosis present

## 2016-07-27 DIAGNOSIS — Z7902 Long term (current) use of antithrombotics/antiplatelets: Secondary | ICD-10-CM

## 2016-07-27 DIAGNOSIS — Z8249 Family history of ischemic heart disease and other diseases of the circulatory system: Secondary | ICD-10-CM

## 2016-07-27 DIAGNOSIS — I959 Hypotension, unspecified: Secondary | ICD-10-CM | POA: Diagnosis not present

## 2016-07-27 DIAGNOSIS — R55 Syncope and collapse: Secondary | ICD-10-CM

## 2016-07-27 DIAGNOSIS — N170 Acute kidney failure with tubular necrosis: Secondary | ICD-10-CM | POA: Diagnosis present

## 2016-07-27 DIAGNOSIS — I255 Ischemic cardiomyopathy: Secondary | ICD-10-CM | POA: Diagnosis present

## 2016-07-27 LAB — BASIC METABOLIC PANEL
Anion gap: 14 (ref 5–15)
BUN: 20 mg/dL (ref 6–20)
CHLORIDE: 95 mmol/L — AB (ref 101–111)
CO2: 24 mmol/L (ref 22–32)
CREATININE: 2.25 mg/dL — AB (ref 0.61–1.24)
Calcium: 9.5 mg/dL (ref 8.9–10.3)
GFR, EST AFRICAN AMERICAN: 37 mL/min — AB (ref >60)
GFR, EST NON AFRICAN AMERICAN: 32 mL/min — AB (ref >60)
Glucose, Bld: 88 mg/dL (ref 65–99)
POTASSIUM: 3.4 mmol/L — AB (ref 3.5–5.1)
SODIUM: 133 mmol/L — AB (ref 135–145)

## 2016-07-27 LAB — CBC
HEMATOCRIT: 45.7 % (ref 39.0–52.0)
Hemoglobin: 15.2 g/dL (ref 13.0–17.0)
MCH: 27.4 pg (ref 26.0–34.0)
MCHC: 33.3 g/dL (ref 30.0–36.0)
MCV: 82.5 fL (ref 78.0–100.0)
PLATELETS: 255 10*3/uL (ref 150–400)
RBC: 5.54 MIL/uL (ref 4.22–5.81)
RDW: 14.2 % (ref 11.5–15.5)
WBC: 13.7 10*3/uL — AB (ref 4.0–10.5)

## 2016-07-27 LAB — BRAIN NATRIURETIC PEPTIDE: B Natriuretic Peptide: 321.6 pg/mL — ABNORMAL HIGH (ref 0.0–100.0)

## 2016-07-27 LAB — PHOSPHORUS: PHOSPHORUS: 3.1 mg/dL (ref 2.5–4.6)

## 2016-07-27 LAB — MAGNESIUM: MAGNESIUM: 2.4 mg/dL (ref 1.7–2.4)

## 2016-07-27 LAB — I-STAT TROPONIN, ED: Troponin i, poc: 0.07 ng/mL (ref 0.00–0.08)

## 2016-07-27 MED ORDER — POTASSIUM CHLORIDE CRYS ER 10 MEQ PO TBCR
30.0000 meq | EXTENDED_RELEASE_TABLET | Freq: Once | ORAL | Status: AC
  Administered 2016-07-28: 30 meq via ORAL
  Filled 2016-07-27: qty 1

## 2016-07-27 MED ORDER — SODIUM CHLORIDE 0.9 % IV SOLN: 250.0000 mL | INTRAVENOUS | Status: DC | PRN

## 2016-07-27 MED ORDER — SODIUM CHLORIDE 0.9% FLUSH: 3.0000 mL | INTRAVENOUS | Status: DC | PRN

## 2016-07-27 MED ORDER — DIGOXIN 125 MCG PO TABS: 0.1250 mg | ORAL_TABLET | ORAL | Status: DC

## 2016-07-27 MED ORDER — ISOSORB DINITRATE-HYDRALAZINE 20-37.5 MG PO TABS
0.5000 | ORAL_TABLET | Freq: Two times a day (BID) | ORAL | Status: DC
  Administered 2016-07-28 – 2016-07-29 (×4): 0.5 via ORAL
  Filled 2016-07-27 (×4): qty 1

## 2016-07-27 MED ORDER — ALPRAZOLAM 0.25 MG PO TABS: 0.2500 mg | ORAL_TABLET | Freq: Two times a day (BID) | ORAL | Status: DC | PRN

## 2016-07-27 MED ORDER — ROSUVASTATIN CALCIUM 10 MG PO TABS
20.0000 mg | ORAL_TABLET | Freq: Every day | ORAL | Status: DC
  Administered 2016-07-28 – 2016-08-01 (×6): 20 mg via ORAL
  Filled 2016-07-27: qty 1
  Filled 2016-07-27: qty 2
  Filled 2016-07-27: qty 1
  Filled 2016-07-27 (×2): qty 2
  Filled 2016-07-27 (×2): qty 1
  Filled 2016-07-27 (×2): qty 2
  Filled 2016-07-27 (×2): qty 1
  Filled 2016-07-27: qty 2

## 2016-07-27 MED ORDER — MAGNESIUM SULFATE 2 GM/50ML IV SOLN: 2.0000 g | Freq: Once | INTRAVENOUS | Status: DC

## 2016-07-27 MED ORDER — TICAGRELOR 90 MG PO TABS
90.0000 mg | ORAL_TABLET | Freq: Two times a day (BID) | ORAL | Status: DC
  Administered 2016-07-28 – 2016-08-02 (×11): 90 mg via ORAL
  Filled 2016-07-27 (×12): qty 1

## 2016-07-27 MED ORDER — ACETAMINOPHEN 325 MG PO TABS: 650.0000 mg | ORAL_TABLET | ORAL | Status: DC | PRN

## 2016-07-27 MED ORDER — DIPHENHYDRAMINE-ZINC ACETATE 2-0.1 % EX CREA: 1.0000 "application " | TOPICAL_CREAM | Freq: Every day | CUTANEOUS | Status: DC | PRN

## 2016-07-27 MED ORDER — HEPARIN SODIUM (PORCINE) 5000 UNIT/ML IJ SOLN
5000.0000 [IU] | Freq: Three times a day (TID) | INTRAMUSCULAR | Status: DC
  Administered 2016-07-28 – 2016-08-02 (×11): 5000 [IU] via SUBCUTANEOUS
  Filled 2016-07-27 (×12): qty 1

## 2016-07-27 MED ORDER — ONDANSETRON HCL 4 MG/2ML IJ SOLN: 4.0000 mg | Freq: Four times a day (QID) | INTRAMUSCULAR | Status: DC | PRN

## 2016-07-27 MED ORDER — NITROGLYCERIN 0.4 MG SL SUBL: 0.4000 mg | SUBLINGUAL_TABLET | SUBLINGUAL | Status: DC | PRN

## 2016-07-27 MED ORDER — IVABRADINE HCL 5 MG PO TABS
2.5000 mg | ORAL_TABLET | Freq: Two times a day (BID) | ORAL | Status: DC
  Administered 2016-07-28 – 2016-08-02 (×10): 2.5 mg via ORAL
  Filled 2016-07-27 (×12): qty 1

## 2016-07-27 MED ORDER — ASPIRIN 81 MG PO CHEW
81.0000 mg | CHEWABLE_TABLET | Freq: Every day | ORAL | Status: DC
  Administered 2016-07-28 – 2016-08-02 (×6): 81 mg via ORAL
  Filled 2016-07-27 (×6): qty 1

## 2016-07-27 MED ORDER — ADULT MULTIVITAMIN W/MINERALS CH
1.0000 | ORAL_TABLET | Freq: Every day | ORAL | Status: DC
  Administered 2016-07-28 – 2016-08-02 (×6): 1 via ORAL
  Filled 2016-07-27 (×6): qty 1

## 2016-07-27 MED ORDER — SODIUM CHLORIDE 0.9% FLUSH
3.0000 mL | Freq: Two times a day (BID) | INTRAVENOUS | Status: DC
  Administered 2016-07-28 – 2016-07-31 (×6): 3 mL via INTRAVENOUS

## 2016-07-27 MED ORDER — DIPHENHYDRAMINE HCL 12.5 MG/5ML PO ELIX
12.5000 mg | ORAL_SOLUTION | Freq: Four times a day (QID) | ORAL | Status: DC | PRN
  Administered 2016-07-31 (×2): 12.5 mg via ORAL
  Filled 2016-07-27 (×2): qty 10

## 2016-07-27 NOTE — ED Triage Notes (Signed)
Per [facility]: Pt to ED from home c/o sudden onset central, non-radiating CP accompanied by SOB and feeling as if his "heart stopped," x 30 seconds. Pt was walking and states the next thing he knew, he was on his knees. Denies full LOC/hitting head. Pt has significant cardiac history - 4 MIs (last one 04/29/16), 12 stents and A-Fib. Per EMS, pt in A-Fib with multiple PVCs, placed on 3L O2 , decreasing PVC frequency. Pt A&O x 4, denies CP/SOB/dizziness at this time. States his symptoms have resolved. Resp e/u, skin warm/dry.

## 2016-07-27 NOTE — H&P (Signed)
History and Physical    David [name] CBJ:[id number] DOB: 1965/11/06 DOA: 07/27/2016  PCP: Gilford Rile, MD   Patient coming from: Home  Chief Complaint: Chest pain, palpitations   HPI: David [name] is a 51 y.o. male with medical history significant for coronary artery disease with stents, chronic systolic CHF with EF 16-07%, chronic kidney disease stage II, and hypertension presenting to the emergency department with intermittent chest pains and palpitations. Patient has a complex cardiac history with stents placed in 2009 and 2010, and admission in January of this year following a PEA arrest, resulting in additional stenting. He was evaluated for ICD placement but determined not to be a candidate per the patient's report. He was evaluated by his cardiologist on 07/19/2016 noted to still be having 16% PVCs, , and mexiletine was changed to amiodarone. Patient started taking amiodarone on 07/24/2016 and began to notice some mild general malaise and weakness the following day. He also began to note some worsening palpitations and the sensation as though his heart was stopping for a few seconds. He describes an episode just prior to his presentation in which she was walking and experienced acute onset of severe central chest pain and shortness of breath with a sensation that his heart had stopped. He remarked to his wife that he felt like he was dying, and at her urging, he came into the ED for evaluation. The episode resolved in less than a minute and he is felt fatigued since, but with no residual chest pain or significant dyspnea. There's been no recent fevers or chills and no lower extremity swelling.   ED Course: Upon arrival to the ED, patient is found to be afebrile, saturating well on room air, and with vital signs stable. EKG features a sinus rhythm with first degree AV nodal block, PVCs, and left bundle branch block which was also seen previously. Chemistry panels notable for sodium of 133,  potassium 3.4, and serum creatinine of 2.25, up from an apparent baseline of 1.3. CBC is notable for a leukocytosis to 13,700. Troponin is within the normal limits and BNP is elevated to 322. Runs of nonsustained ventricular tachycardia were noted during the ED stay. Cardiology was consulted by the ED physician and a medical admission was advised. Patient will be observed on the telemetry unit for ongoing evaluation and management of chest pain and palpitations in the setting of nonsustained VT in a patient with EF 15-20% on recent echo.   Review of Systems:  All other systems reviewed and apart from HPI, are negative.  Past Medical History:  Diagnosis Date  . CAD (coronary artery disease) 2009   AMI in 12/2007 with PCI to LAD, staged PCI to  mid/distal RCA, NSTEMI in 02/2009 with BMS to LCx  . CHF (congestive heart failure) ([place])   . HLD (hyperlipidemia)   . HTN (hypertension)   . Ischemic cardiomyopathy    Admitted in 07/2010 with CHF exacerbation    Past Surgical History:  Procedure Laterality Date  . CARDIAC CATHETERIZATION N/A 05/05/2016   Procedure: Right/Left Heart Cath and Coronary Angiography;  Surgeon: Jolaine Artist, MD;  Location: [place] [facility];  Service: Cardiovascular;  Laterality: N/A;  . CARDIAC CATHETERIZATION N/A 05/09/2016   Procedure: Coronary Stent Intervention;  Surgeon: Peter M Martinique, MD;  Location: [place] [facility];  Service: Cardiovascular;  Laterality: N/A;  . CARDIAC CATHETERIZATION N/A 05/09/2016   Procedure: Intravascular Pressure Wire/FFR Study;  Surgeon: Peter M Martinique, MD;  Location: [place] [facility];  Service: Cardiovascular;  Laterality: N/A;  . ELECTROPHYSIOLOGY STUDY N/A 06/24/2016   Procedure: Electrophysiology Study;  Surgeon: Deboraha Sprang, MD;  Location: [place] [facility];  Service: Cardiovascular;  Laterality: N/A;  . ICD IMPLANT N/A 06/24/2016   Procedure: POSSIBLE  ICD Implant;  Surgeon: Deboraha Sprang, MD;  Location: [place] [facility];   Service: Cardiovascular;  Laterality: N/A;  . LEFT HEART CATHETERIZATION WITH CORONARY ANGIOGRAM N/A 03/13/2011   Procedure: LEFT HEART CATHETERIZATION WITH CORONARY ANGIOGRAM;  Surgeon: Lorretta Harp, MD;  Location: [facility];  Service: Cardiovascular;  Laterality: N/A;     reports that he has been smoking Cigars.  He has a 6.00 pack-year smoking history. He has never used smokeless tobacco. He reports that he does not drink alcohol or use drugs.  Allergies  Allergen Reactions  . Plavix [Clopidogrel Bisulfate] Hives    Family History  Problem Relation Age of Onset  . Coronary artery disease Father   . Hypertension Father   . Hypertension Sister   . Coronary artery disease Brother   . Emphysema Brother   . Coronary artery disease Mother   . Hypertension Mother   . Emphysema Mother   . Hypertension Daughter   . Obesity Daughter   . Diabetes    . Hypertension    . Coronary artery disease       Prior to Admission medications   Medication Sig Start Date End Date Taking? Authorizing Provider  amiodarone (PACERONE) 200 MG tablet Take 1 tablet (200 mg total) by mouth 2 (two) times daily. 07/19/16  Yes Deboraha Sprang, MD  aspirin 81 MG chewable tablet Chew 1 tablet (81 mg total) by mouth daily. 05/13/16  Yes Shirley Friar, PA-C  digoxin (LANOXIN) 0.125 MG tablet Take 1 tablet (0.125 mg total) by mouth every other day. 07/19/16  Yes Deboraha Sprang, MD  diphenhydrAMINE (BENADRYL) 25 MG tablet Take 12.5 mg by mouth every 6 (six) hours as needed for itching or allergies.   Yes Historical Provider, MD  diphenhydrAMINE-zinc acetate (BENADRYL) cream Apply 1 application topically daily as needed for itching.   Yes Historical Provider, MD  furosemide (LASIX) 40 MG tablet Take 1 tablet (40 mg total) by mouth daily. Take extra 40 mg as needed for weight gain of 3 lbs overnight or 5 lbs one week. 05/13/16  Yes Satira Mccallum Tillery, PA-C  isosorbide-hydrALAZINE (BIDIL) 20-37.5 MG tablet  Take 0.5 tablets by mouth 3 times/day as needed-between meals & bedtime. Patient taking differently: Take 0.5 tablets by mouth 2 (two) times daily.  05/19/16  Yes Shirley Friar, PA-C  ivabradine (CORLANOR) 5 MG TABS tablet Take 0.5 tablets (2.5 mg total) by mouth 2 (two) times daily with a meal. 05/13/16  Yes Shirley Friar, PA-C  Multiple Vitamins-Minerals (ADULT GUMMY) CHEW Chew 2 tablets by mouth daily.   Yes Historical Provider, MD  rosuvastatin (CRESTOR) 20 MG tablet Take 1 tablet (20 mg total) by mouth daily at 6 PM. 05/13/16  Yes Shirley Friar, PA-C  spironolactone (ALDACTONE) 25 MG tablet Take 1 tablet (25 mg total) by mouth daily. 05/13/16  Yes Shirley Friar, PA-C  ticagrelor (BRILINTA) 90 MG TABS tablet Take 1 tablet (90 mg total) by mouth 2 (two) times daily. 05/13/16  Yes Shirley Friar, PA-C    Physical Exam: Vitals:   07/27/16 2145 07/27/16 2200 07/27/16 2230 07/27/16 2245  BP: 102/88 107/72 127/77 111/68  Pulse: 71 71 71 69  Resp: 16  13 18 14   Temp:      TempSrc:      SpO2: 99% 98% 96% 98%      Constitutional: NAD, calm, comfortable Eyes: PERTLA, lids and conjunctivae normal ENMT: Mucous membranes are moist. Posterior pharynx clear of any exudate or lesions.   Neck: normal, supple, no masses, no thyromegaly Respiratory: clear to auscultation bilaterally, no wheezing, no crackles. Normal respiratory effort.    Cardiovascular: Rate ~80 and irregular. No extremity edema. No significant JVD. Abdomen: No distension, no tenderness, no masses palpated. Bowel sounds normal.  Musculoskeletal: no clubbing / cyanosis. No joint deformity upper and lower extremities.   Skin: no significant rashes, lesions, ulcers. Warm, dry, well-perfused. Neurologic: CN 2-12 grossly intact. Sensation intact, DTR normal. Strength 5/5 in all 4 limbs.  Psychiatric: Alert and oriented x 3. Normal mood and affect.     Labs on Admission: I have personally  reviewed following labs and imaging studies  CBC:  Recent Labs Lab 07/27/16 1930  WBC 13.7*  HGB 15.2  HCT 45.7  MCV 82.5  PLT 361   Basic Metabolic Panel:  Recent Labs Lab 07/27/16 1930  NA 133*  K 3.4*  CL 95*  CO2 24  GLUCOSE 88  BUN 20  CREATININE 2.25*  CALCIUM 9.5  MG 2.4  PHOS 3.1   GFR: Estimated Creatinine Clearance: 33.4 mL/min (A) (by C-G formula based on SCr of 2.25 mg/dL (H)). Liver Function Tests: No results for input(s): AST, ALT, ALKPHOS, BILITOT, PROT, ALBUMIN in the last 168 hours. No results for input(s): LIPASE, AMYLASE in the last 168 hours. No results for input(s): AMMONIA in the last 168 hours. Coagulation Profile: No results for input(s): INR, PROTIME in the last 168 hours. Cardiac Enzymes: No results for input(s): CKTOTAL, CKMB, CKMBINDEX, TROPONINI in the last 168 hours. BNP (last 3 results) No results for input(s): PROBNP in the last 8760 hours. HbA1C: No results for input(s): HGBA1C in the last 72 hours. CBG: No results for input(s): GLUCAP in the last 168 hours. Lipid Profile: No results for input(s): CHOL, HDL, LDLCALC, TRIG, CHOLHDL, LDLDIRECT in the last 72 hours. Thyroid Function Tests: No results for input(s): TSH, T4TOTAL, FREET4, T3FREE, THYROIDAB in the last 72 hours. Anemia Panel: No results for input(s): VITAMINB12, FOLATE, FERRITIN, TIBC, IRON, RETICCTPCT in the last 72 hours. Urine analysis:    Component Value Date/Time   COLORURINE YELLOW 01/22/2008 1947   APPEARANCEUR CLEAR 01/22/2008 1947   LABSPEC 1.031 (H) 01/22/2008 1947   PHURINE 6.0 01/22/2008 1947   GLUCOSEU NEGATIVE 01/22/2008 1947   HGBUR SMALL (A) 01/22/2008 1947   BILIRUBINUR NEGATIVE 01/22/2008 1947   KETONESUR NEGATIVE 01/22/2008 1947   PROTEINUR 30 (A) 01/22/2008 1947   UROBILINOGEN 1.0 01/22/2008 1947   NITRITE NEGATIVE 01/22/2008 1947   LEUKOCYTESUR NEGATIVE 01/22/2008 1947   Sepsis Labs: @LABRCNTIP (procalcitonin:4,lacticidven:4) )No  results found for this or any previous visit (from the past 240 hour(s)).   Radiological Exams on Admission: Dg Chest Port 1 View  Result Date: 07/27/2016 CLINICAL DATA:  Palpitations, shortness of breath, and dizziness today, history hypertension, CHF, coronary artery disease post PTCA, ischemic cardiomyopathy EXAM: PORTABLE CHEST 1 VIEW COMPARISON:  Portable exam 1932 hours compared 04/30/2016 FINDINGS: Upper normal size of cardiac silhouette with note of coronary arterial stent. Mediastinal contours and pulmonary vascularity normal. Mild central peribronchial thickening. Lungs clear. No pleural effusion or pneumothorax. Bones unremarkable. IMPRESSION: Mild bronchitic changes without infiltrate. Electronically Signed   By: Lavonia Dana M.D.   On:  07/27/2016 19:50    EKG: Independently reviewed. Sinus rhythm, 1st degree AV block, PVC's, LBBB (also seen in January)  Assessment/Plan  1. Chest pain, palpitations  - Pt reports increased palpitations since starting amiodarone on 07/24/16; describes feeling as though his heart stops for several seconds  - He had a severe, fleeting, central chest pain on 07/27/16, again with the sensation that his heart had stopped for several seconds  - No pain upon arrival in ED  - Initial troponin is wnl; EKG with sinus rhythm, 1 degree AV block, PVC's, and known LBBB  - Sxs seem to be resulting from non-sustained VT; reports recent eval for possible ICD placement  - Cardiology is consulting and much appreciated; will follow-up recommendations - Continue cardiac monitoring, replete potassium and magnesium, cycle troponin, continue ASA 81, Brilinta and Crestor   2. Chronic systolic CHF  - Pt appears euvolemic on admission - TTE (05/02/16) with EF 15-20%, diffuse HK, inferior and posterior AK, moderate MR, and moderate LAE  - SLIV, follow daily wts and I/O's, hold Lasix and Aldactone given AKI and no apparent hypervolemia  - Continue digoxin as tolerated    3. CAD  -  Pt has known CAD with stents  - Chest pain, as above, likely secondary to transient arrhythmias (non-sustained VT)  - No anginal complaints at time of admission  - Continue ASA 81, Brilinta, Crestor, ivabridine   4. Acute kidney injury superimposed on CKD stage II  - SCr is 2.25 on admission, up from apparent baseline of ~1.3  - Pt appears euvolemic on admission  - Plan to hold diuretics initially, avoid nephrotoxins where feasible, renally-dose medications, check urine studies, and repeat chemistries in am    5. Paroxysmal atrial fibrillation  - In a sinus rhythm on admission with frequent PVC's/NSVT  - CHADS-VASc at least 2 (CAD, CHF)  - Followed by cardiology and no longer on anticoagulant  - He is being monitored on telemetry and amiodarone is held on admission as above     DVT prophylaxis: sq heparin  Code Status: Full  Family Communication: Wife updated at bedside Disposition Plan: Observe on telemetry Consults called: Cardiology Admission status: Observation    Vianne Bulls, MD [facility] Pager [phone]  If 7PM-7AM, please contact night-coverage [email or web address] Password [email or web address]  07/27/2016, 11:28 PM

## 2016-07-27 NOTE — ED Provider Notes (Signed)
[facility] DEPT Provider Note   CSN: [id number] Arrival date & time: 07/27/16  4098     History   Chief Complaint Chief Complaint  Patient presents with  . Chest Pain    HPI David [name] is a 51 y.o. male.  51 yo M with a chief complaint of a feeling of impending doom. Patient states that he was watching TV and suddenly felt that his heart was in a funny rhythm. He says it felt like when he recently went into cardiac arrest. Describes it is the feeling he gets when he has that fatal arrhythmia. Had some shortness of breath like his capacity out. Denies any weight gain. Denies cough congestion fevers. Denies abdominal pain. Patient recently was taken off of mexelitine and started back on amio.  Has been feeling a bit off since then.  He feels its hard to describe, maybe lightheaded.    The history is provided by the patient.  Chest Pain   This is a new problem. The current episode started 1 to 2 hours ago. The problem occurs rarely. The problem has been resolved. The pain is associated with rest. The pain is present in the substernal region. The patient is experiencing no pain. The quality of the pain is described as brief. Duration of episode(s) is 30 seconds. Associated symptoms include shortness of breath. Pertinent negatives include no abdominal pain, no fever, no headaches, no palpitations and no vomiting. He has tried nothing for the symptoms. The treatment provided no relief.    Past Medical History:  Diagnosis Date  . CAD (coronary artery disease) 2009   AMI in 12/2007 with PCI to LAD, staged PCI to  mid/distal RCA, NSTEMI in 02/2009 with BMS to LCx  . CHF (congestive heart failure) ([place])   . HLD (hyperlipidemia)   . HTN (hypertension)   . Ischemic cardiomyopathy    Admitted in 07/2010 with CHF exacerbation    Patient Active Problem List   Diagnosis Date Noted  . Chest pain 07/27/2016  . Ventricular tachycardia, nonsustained ([place]) 07/27/2016  . CKD (chronic kidney  disease), stage III 05/13/2016  . Chronic anemia 05/13/2016  . PAF (paroxysmal atrial fibrillation) ([place])   . CAD (coronary artery disease), native coronary artery 03/15/2011  . Hypertension, essential 03/15/2011  . Hyperlipidemia 03/15/2011  . Chronic systolic HF (heart failure) ([place]) 03/15/2011  . STEMI 2009 (anterior), 2010 (lateral), and 2012 (inferior) 03/14/2011    Past Surgical History:  Procedure Laterality Date  . CARDIAC CATHETERIZATION N/A 05/05/2016   Procedure: Right/Left Heart Cath and Coronary Angiography;  Surgeon: Jolaine Artist, MD;  Location: [facility];  Service: Cardiovascular;  Laterality: N/A;  . CARDIAC CATHETERIZATION N/A 05/09/2016   Procedure: Coronary Stent Intervention;  Surgeon: Peter M Martinique, MD;  Location: [facility];  Service: Cardiovascular;  Laterality: N/A;  . CARDIAC CATHETERIZATION N/A 05/09/2016   Procedure: Intravascular Pressure Wire/FFR Study;  Surgeon: Peter M Martinique, MD;  Location: [facility];  Service: Cardiovascular;  Laterality: N/A;  . ELECTROPHYSIOLOGY STUDY N/A 06/24/2016   Procedure: Electrophysiology Study;  Surgeon: Deboraha Sprang, MD;  Location: [facility];  Service: Cardiovascular;  Laterality: N/A;  . ICD IMPLANT N/A 06/24/2016   Procedure: POSSIBLE  ICD Implant;  Surgeon: Deboraha Sprang, MD;  Location: [facility];  Service: Cardiovascular;  Laterality: N/A;  . LEFT HEART CATHETERIZATION WITH CORONARY ANGIOGRAM N/A 03/13/2011   Procedure: LEFT HEART CATHETERIZATION WITH CORONARY ANGIOGRAM;  Surgeon: Pearletha Forge  Gwenlyn Found, MD;  Location: [facility] C[facility];  Service: Cardiovascular;  Laterality: N/A;       Home Medications    Prior to Admission medications   Medication Sig Start Date End Date Taking? Authorizing Provider  amiodarone (PACERONE) 200 MG tablet Take 1 tablet (200 mg total) by mouth 2 (two) times daily. 07/19/16  Yes Deboraha Sprang, MD  aspirin 81 MG chewable tablet Chew 1 tablet (81 mg  total) by mouth daily. 05/13/16  Yes Shirley Friar, PA-C  digoxin (LANOXIN) 0.125 MG tablet Take 1 tablet (0.125 mg total) by mouth every other day. 07/19/16  Yes Deboraha Sprang, MD  diphenhydrAMINE (BENADRYL) 25 MG tablet Take 12.5 mg by mouth every 6 (six) hours as needed for itching or allergies.   Yes Historical Provider, MD  diphenhydrAMINE-zinc acetate (BENADRYL) cream Apply 1 application topically daily as needed for itching.   Yes Historical Provider, MD  furosemide (LASIX) 40 MG tablet Take 1 tablet (40 mg total) by mouth daily. Take extra 40 mg as needed for weight gain of 3 lbs overnight or 5 lbs one week. 05/13/16  Yes Satira Mccallum Tillery, PA-C  isosorbide-hydrALAZINE (BIDIL) 20-37.5 MG tablet Take 0.5 tablets by mouth 3 times/day as needed-between meals & bedtime. Patient taking differently: Take 0.5 tablets by mouth 2 (two) times daily.  05/19/16  Yes Shirley Friar, PA-C  ivabradine (CORLANOR) 5 MG TABS tablet Take 0.5 tablets (2.5 mg total) by mouth 2 (two) times daily with a meal. 05/13/16  Yes Shirley Friar, PA-C  Multiple Vitamins-Minerals (ADULT GUMMY) CHEW Chew 2 tablets by mouth daily.   Yes Historical Provider, MD  rosuvastatin (CRESTOR) 20 MG tablet Take 1 tablet (20 mg total) by mouth daily at 6 PM. 05/13/16  Yes Shirley Friar, PA-C  spironolactone (ALDACTONE) 25 MG tablet Take 1 tablet (25 mg total) by mouth daily. 05/13/16  Yes Shirley Friar, PA-C  ticagrelor (BRILINTA) 90 MG TABS tablet Take 1 tablet (90 mg total) by mouth 2 (two) times daily. 05/13/16  Yes Shirley Friar, PA-C    Family History Family History  Problem Relation Age of Onset  . Coronary artery disease Father   . Hypertension Father   . Hypertension Sister   . Coronary artery disease Brother   . Emphysema Brother   . Coronary artery disease Mother   . Hypertension Mother   . Emphysema Mother   . Hypertension Daughter   . Obesity Daughter   . Diabetes     . Hypertension    . Coronary artery disease      Social History Social History  Substance Use Topics  . Smoking status: Current Every Day Smoker    Packs/day: 0.20    Years: 30.00    Types: Cigars  . Smokeless tobacco: Never Used     Comment: Used to smoke cigarettes. Pt is currently smoking 3 black & mild cigars per day. He has cut down but never quit.  . Alcohol use No     Allergies   Plavix [clopidogrel bisulfate]   Review of Systems Review of Systems  Constitutional: Negative for chills and fever.  HENT: Negative for congestion and facial swelling.   Eyes: Negative for discharge and visual disturbance.  Respiratory: Positive for shortness of breath.   Cardiovascular: Negative for chest pain and palpitations.  Gastrointestinal: Negative for abdominal pain, diarrhea and vomiting.  Musculoskeletal: Negative for arthralgias and myalgias.  Skin: Negative for color change and rash.  Neurological: Positive  for syncope (near). Negative for tremors and headaches.  Psychiatric/Behavioral: Negative for confusion and dysphoric mood.     Physical Exam Updated Vital Signs BP 102/88   Pulse 71   Temp 97.7 F (36.5 C) (Oral)   Resp 16   SpO2 99%   Physical Exam  Constitutional: He is oriented to person, place, and time. He appears well-developed and well-nourished.  HENT:  Head: Normocephalic and atraumatic.  Eyes: EOM are normal. Pupils are equal, round, and reactive to light.  Neck: Normal range of motion. Neck supple. No JVD present.  Cardiovascular: Normal rate and regular rhythm.  Exam reveals no gallop and no friction rub.   No murmur heard. Pulmonary/Chest: No respiratory distress. He has no wheezes.  Abdominal: He exhibits no distension and no mass. There is no tenderness. There is no rebound and no guarding.  Musculoskeletal: Normal range of motion.  Neurological: He is alert and oriented to person, place, and time.  Skin: No rash noted. No pallor.    Psychiatric: He has a normal mood and affect. His behavior is normal.  Nursing note and vitals reviewed.    ED Treatments / Results  Labs (all labs ordered are listed, but only abnormal results are displayed) Labs Reviewed  BASIC METABOLIC PANEL - Abnormal; Notable for the following:       Result Value   Sodium 133 (*)    Potassium 3.4 (*)    Chloride 95 (*)    Creatinine, Ser 2.25 (*)    GFR calc non Af Amer 32 (*)    GFR calc Af Amer 37 (*)    All other components within normal limits  CBC - Abnormal; Notable for the following:    WBC 13.7 (*)    All other components within normal limits  BRAIN NATRIURETIC PEPTIDE - Abnormal; Notable for the following:    B Natriuretic Peptide 321.6 (*)    All other components within normal limits  MAGNESIUM  PHOSPHORUS  I-STAT TROPOININ, ED    EKG  EKG Interpretation  Date/Time:  Wednesday July 27 2016 19:22:00 EDT Ventricular Rate:  82 PR Interval:  252 QRS Duration: 156 QT Interval:  420 QTC Calculation: 490 R Axis:   -15 Text Interpretation:  Sinus rhythm with 1st degree A-V block with occasional and consecutive Premature ventricular complexes Possible Left atrial enlargement Left bundle branch block Abnormal ECG Otherwise no significant change Confirmed by Panfilo Ketchum MD, Quillian Quince [phone]) on 07/27/2016 7:31:29 PM       Radiology Dg Chest Port 1 View  Result Date: 07/27/2016 CLINICAL DATA:  Palpitations, shortness of breath, and dizziness today, history hypertension, CHF, coronary artery disease post PTCA, ischemic cardiomyopathy EXAM: PORTABLE CHEST 1 VIEW COMPARISON:  Portable exam 1932 hours compared 04/30/2016 FINDINGS: Upper normal size of cardiac silhouette with note of coronary arterial stent. Mediastinal contours and pulmonary vascularity normal. Mild central peribronchial thickening. Lungs clear. No pleural effusion or pneumothorax. Bones unremarkable. IMPRESSION: Mild bronchitic changes without infiltrate. Electronically Signed    By: Lavonia Dana M.D.   On: 07/27/2016 19:50    Procedures Procedures (including critical care time)  Medications Ordered in ED Medications - No data to display   Initial Impression / Assessment and Plan / ED Course  I have reviewed the triage vital signs and the nursing notes.  Pertinent labs & imaging results that were available during my care of the patient were reviewed by me and considered in my medical decision making (see chart for details).  51 yo M With a chief complaint of a syncopal event. Patient is at high risk for fatal arrhythmia as he is artery had one of these before does not have a implanted defibrillator. I will obtain labs and discuss the case with cardiology.  Telemetry monitor was evaluated patient having multiple runs of nonsustained V. tach.  Discussed with Dr. Koleen Nimrod, cards.  Recommend medical admission for tele.    The patients results and plan were reviewed and discussed.   Any x-rays performed were independently reviewed by myself.   Differential diagnosis were considered with the presenting HPI.  CRITICAL CARE Performed by: Cecilio Asper   Total critical care time: 35 minutes  Critical care time was exclusive of separately billable procedures and treating other patients.  Critical care was necessary to treat or prevent imminent or life-threatening deterioration.  Critical care was time spent personally by me on the following activities: development of treatment plan with patient and/or surrogate as well as nursing, discussions with consultants, evaluation of patient's response to treatment, examination of patient, obtaining history from patient or surrogate, ordering and performing treatments and interventions, ordering and review of laboratory studies, ordering and review of radiographic studies, pulse oximetry and re-evaluation of patient's condition.   Medications - No data to display  Vitals:   07/27/16 2045 07/27/16 2100  07/27/16 2130 07/27/16 2145  BP: 111/70 101/74 106/74 102/88  Pulse: 72 (!) 52 73 71  Resp: 14 18 (!) 22 16  Temp:      TempSrc:      SpO2: 98% 97% 97% 99%    Final diagnoses:  Near syncope  Ventricular tachycardia ([place])    Admission/ observation were discussed with the admitting physician, patient and/or family and they are comfortable with the plan.     Final Clinical Impressions(s) / ED Diagnoses   Final diagnoses:  Near syncope  Ventricular tachycardia [facility])    New Prescriptions New Prescriptions   No medications on file     Deno Etienne, DO 07/27/16 2259

## 2016-07-27 NOTE — ED Notes (Signed)
Patient's wife, Ikenna Ohms, may be reached at [phone].

## 2016-07-28 ENCOUNTER — Encounter (HOSPITAL_COMMUNITY): Payer: Medicare HMO | Admitting: Internal Medicine

## 2016-07-28 ENCOUNTER — Encounter (HOSPITAL_COMMUNITY): Payer: Self-pay

## 2016-07-28 DIAGNOSIS — E876 Hypokalemia: Secondary | ICD-10-CM

## 2016-07-28 DIAGNOSIS — I10 Essential (primary) hypertension: Secondary | ICD-10-CM

## 2016-07-28 DIAGNOSIS — I472 Ventricular tachycardia: Principal | ICD-10-CM

## 2016-07-28 DIAGNOSIS — R55 Syncope and collapse: Secondary | ICD-10-CM | POA: Diagnosis not present

## 2016-07-28 DIAGNOSIS — R079 Chest pain, unspecified: Secondary | ICD-10-CM | POA: Diagnosis not present

## 2016-07-28 DIAGNOSIS — R42 Dizziness and giddiness: Secondary | ICD-10-CM | POA: Diagnosis not present

## 2016-07-28 DIAGNOSIS — N182 Chronic kidney disease, stage 2 (mild): Secondary | ICD-10-CM | POA: Diagnosis not present

## 2016-07-28 DIAGNOSIS — I480 Paroxysmal atrial fibrillation: Secondary | ICD-10-CM | POA: Diagnosis not present

## 2016-07-28 DIAGNOSIS — I5022 Chronic systolic (congestive) heart failure: Secondary | ICD-10-CM | POA: Diagnosis not present

## 2016-07-28 DIAGNOSIS — I2510 Atherosclerotic heart disease of native coronary artery without angina pectoris: Secondary | ICD-10-CM | POA: Diagnosis not present

## 2016-07-28 DIAGNOSIS — N179 Acute kidney failure, unspecified: Secondary | ICD-10-CM | POA: Diagnosis not present

## 2016-07-28 LAB — CREATININE, URINE, RANDOM: Creatinine, Urine: 219.67 mg/dL

## 2016-07-28 LAB — BASIC METABOLIC PANEL
ANION GAP: 10 (ref 5–15)
BUN: 18 mg/dL (ref 6–20)
CALCIUM: 9.6 mg/dL (ref 8.9–10.3)
CO2: 28 mmol/L (ref 22–32)
Chloride: 98 mmol/L — ABNORMAL LOW (ref 101–111)
Creatinine, Ser: 2.2 mg/dL — ABNORMAL HIGH (ref 0.61–1.24)
GFR, EST AFRICAN AMERICAN: 38 mL/min — AB (ref >60)
GFR, EST NON AFRICAN AMERICAN: 33 mL/min — AB (ref >60)
Glucose, Bld: 77 mg/dL (ref 65–99)
POTASSIUM: 3.4 mmol/L — AB (ref 3.5–5.1)
Sodium: 136 mmol/L (ref 135–145)

## 2016-07-28 LAB — MAGNESIUM: MAGNESIUM: 2.3 mg/dL (ref 1.7–2.4)

## 2016-07-28 LAB — TROPONIN I
TROPONIN I: 0.1 ng/mL — AB (ref <0.03)
Troponin I: 0.09 ng/mL (ref <0.03)
Troponin I: 0.08 ng/mL (ref <0.03)

## 2016-07-28 LAB — SODIUM, URINE, RANDOM: Sodium, Ur: 28 mmol/L

## 2016-07-28 LAB — TSH: TSH: 2.148 u[IU]/mL (ref 0.350–4.500)

## 2016-07-28 LAB — HIV ANTIBODY (ROUTINE TESTING W REFLEX): HIV SCREEN 4TH GENERATION: NONREACTIVE

## 2016-07-28 MED ORDER — RANOLAZINE ER 500 MG PO TB12
500.0000 mg | ORAL_TABLET | Freq: Two times a day (BID) | ORAL | Status: DC
  Administered 2016-07-28 – 2016-08-02 (×10): 500 mg via ORAL
  Filled 2016-07-28 (×10): qty 1

## 2016-07-28 MED ORDER — POTASSIUM CHLORIDE CRYS ER 20 MEQ PO TBCR
40.0000 meq | EXTENDED_RELEASE_TABLET | Freq: Once | ORAL | Status: AC
  Administered 2016-07-28: 40 meq via ORAL
  Filled 2016-07-28: qty 2

## 2016-07-28 MED ORDER — DIGOXIN 125 MCG PO TABS
0.1250 mg | ORAL_TABLET | ORAL | Status: DC
  Administered 2016-07-29: 0.125 mg via ORAL
  Filled 2016-07-28 (×2): qty 1

## 2016-07-28 NOTE — Consult Note (Signed)
Advanced Heart Failure Team Consult Note   Primary Physician: Dr Bea Graff Primary Cardiologist:  Dr Haroldine Laws   Reason for Consultation: Heart Failure   HPI:    David [name] is seen today for evaluation of heart failure at the request of Dr Rockne Menghini.    David [name] is a 51 year old with a history of CAD, hyperlipidemia, HTN, A Fib/Aflutter, CAD 3V S/P Des x2 of mid RCA and LAD.   Admitted January 6-May 13, 8364 with A/C systolic heart failure. Hospitalization was complicated by PEA arrest, respiratory failure. Started on pressors and later weaned off. Underwent LHC and had 3V CAD with stents placed to RCA and LAD. Post cath he had bradycardia. EP consulted for possible ICD but it was thought to be vagal procedure.   As an outpatient he was evaluated by Dr Caryl Comes. EP study on 3/2 without  inducible VT but lots of PVCs. He had follow up with Dr Caryl Comes on March 27th with 16% PVCs so mexiletine was changed to amiodarone. He started amiodarone on April 1st.   Over the last few days he reports increased fatigue and palpitations. Late yesterday he was walking and developed dizziness and shortness of breath for 20-30 seconds.  He told he his wife he thought he was dying. Denies syncope. He had been taking all medications.   He presented to [facility] with stable vital signs. While in the ED he had NSVT and frequent PVCs. Pertinent admission labs include: K 3.4, Creatinine 2.25, BNP 321, Hgb 15.2 , troponin 0.09>0.10>0.08. CXR without edema. He was admitted for further evaluation. EP consulted. He has continued on HF meds.   Today he denies SOB/dizziness/CP.     Cardiac Studies ECHO 04/2016-EF 15-20% RV mildly dilated.  RHC/LHC 05/06/2016   Prox RCA to Mid RCA lesion, 80 %stenosed.  Mid RCA lesion, 40 %stenosed.  Dist RCA lesion, 20 %stenosed.  Post Atrio lesion, 80 %stenosed.  Ost Cx to Mid Cx lesion, 40 %stenosed.  Mid LAD to Dist LAD lesion, 70 %stenosed.  Dist LAD lesion, 80  %stenosed. Findings: Ao = 101/75 (87) LV = 102/35 RA = 8 RV = 62/10 PA = 65/34 (47) PCW = 37 (v = 45) Fick cardiac output/index = 3.8/2.1 PVR = 2.9 WU SVR = 1555 FA sat = 96% PA sat = 60%. 61% Review of Systems: [y] = yes, [ ]  = no   General: Weight gain [ ] ; Weight loss [ ] ; Anorexia [ ] ; Fatigue [Y ]; Fever [ ] ; Chills [ ] ; Weakness [Y ]  Cardiac: Chest pain/pressure [ Y]; Resting SOB [ ] ; Exertional SOB [ ] ; Orthopnea [ ] ; Pedal Edema [ ] ; Palpitations [ ] ; Syncope [ ] ; Presyncope [ ] ; Paroxysmal nocturnal dyspnea[ ]   Pulmonary: Cough [ ] ; Wheezing[ ] ; Hemoptysis[ ] ; Sputum [ ] ; Snoring [ ]   GI: Vomiting[ ] ; Dysphagia[ ] ; Melena[ ] ; Hematochezia [ ] ; Heartburn[ ] ; Abdominal pain [ ] ; Constipation [ ] ; Diarrhea [ ] ; BRBPR [ ]   GU: Hematuria[ ] ; Dysuria [ ] ; Nocturia[ ]   Vascular: Pain in legs with walking [ ] ; Pain in feet with lying flat [ ] ; Non-healing sores [ ] ; Stroke [ ] ; TIA [ ] ; Slurred speech [ ] ;  Neuro: Headaches[ ] ; Vertigo[ ] ; Seizures[ ] ; Paresthesias[ ] ;Blurred vision [ ] ; Diplopia [ ] ; Vision changes [ ]   Ortho/Skin: Arthritis [ ] ; Joint pain [Y ]; Muscle pain [ ] ; Joint swelling [ ] ; Back Pain [ ] ; Rash [ ]   Psych: Depression[ ] ;  Anxiety[ ]   Heme: Bleeding problems [ ] ; Clotting disorders [ ] ; Anemia [ ]   Endocrine: Diabetes [ ] ; Thyroid dysfunction[ ]   Home Medications Prior to Admission medications   Medication Sig Start Date End Date Taking? Authorizing Provider  amiodarone (PACERONE) 200 MG tablet Take 1 tablet (200 mg total) by mouth 2 (two) times daily. 07/19/16  Yes Deboraha Sprang, MD  aspirin 81 MG chewable tablet Chew 1 tablet (81 mg total) by mouth daily. 05/13/16  Yes Shirley Friar, PA-C  digoxin (LANOXIN) 0.125 MG tablet Take 1 tablet (0.125 mg total) by mouth every other day. 07/19/16  Yes Deboraha Sprang, MD  diphenhydrAMINE (BENADRYL) 25 MG tablet Take 12.5 mg by mouth every 6 (six) hours as needed for itching or allergies.   Yes Historical  Provider, MD  diphenhydrAMINE-zinc acetate (BENADRYL) cream Apply 1 application topically daily as needed for itching.   Yes Historical Provider, MD  furosemide (LASIX) 40 MG tablet Take 1 tablet (40 mg total) by mouth daily. Take extra 40 mg as needed for weight gain of 3 lbs overnight or 5 lbs one week. 05/13/16  Yes Satira Mccallum Tillery, PA-C  isosorbide-hydrALAZINE (BIDIL) 20-37.5 MG tablet Take 0.5 tablets by mouth 3 times/day as needed-between meals & bedtime. Patient taking differently: Take 0.5 tablets by mouth 2 (two) times daily.  05/19/16  Yes Shirley Friar, PA-C  ivabradine (CORLANOR) 5 MG TABS tablet Take 0.5 tablets (2.5 mg total) by mouth 2 (two) times daily with a meal. 05/13/16  Yes Shirley Friar, PA-C  Multiple Vitamins-Minerals (ADULT GUMMY) CHEW Chew 2 tablets by mouth daily.   Yes Historical Provider, MD  rosuvastatin (CRESTOR) 20 MG tablet Take 1 tablet (20 mg total) by mouth daily at 6 PM. 05/13/16  Yes Shirley Friar, PA-C  spironolactone (ALDACTONE) 25 MG tablet Take 1 tablet (25 mg total) by mouth daily. 05/13/16  Yes Shirley Friar, PA-C  ticagrelor (BRILINTA) 90 MG TABS tablet Take 1 tablet (90 mg total) by mouth 2 (two) times daily. 05/13/16  Yes Shirley Friar, PA-C    Past Medical History: Past Medical History:  Diagnosis Date  . CAD (coronary artery disease) 2009   AMI in 12/2007 with PCI to LAD, staged PCI to  mid/distal RCA, NSTEMI in 02/2009 with BMS to LCx  . CHF (congestive heart failure) ([place])   . HLD (hyperlipidemia)   . HTN (hypertension)   . Ischemic cardiomyopathy    Admitted in 07/2010 with CHF exacerbation    Past Surgical History: Past Surgical History:  Procedure Laterality Date  . CARDIAC CATHETERIZATION N/A 05/05/2016   Procedure: Right/Left Heart Cath and Coronary Angiography;  Surgeon: Jolaine Artist, MD;  Location: [facility];  Service: Cardiovascular;  Laterality: N/A;  . CARDIAC  CATHETERIZATION N/A 05/09/2016   Procedure: Coronary Stent Intervention;  Surgeon: Peter M Martinique, MD;  Location: [facility];  Service: Cardiovascular;  Laterality: N/A;  . CARDIAC CATHETERIZATION N/A 05/09/2016   Procedure: Intravascular Pressure Wire/FFR Study;  Surgeon: Peter M Martinique, MD;  Location: [place] [facility];  Service: Cardiovascular;  Laterality: N/A;  . ELECTROPHYSIOLOGY STUDY N/A 06/24/2016   Procedure: Electrophysiology Study;  Surgeon: Deboraha Sprang, MD;  Location: [facility];  Service: Cardiovascular;  Laterality: N/A;  . ICD IMPLANT N/A 06/24/2016   Procedure: POSSIBLE  ICD Implant;  Surgeon: Deboraha Sprang, MD;  Location: [place] [facility];  Service: Cardiovascular;  Laterality: N/A;  .  LEFT HEART CATHETERIZATION WITH CORONARY ANGIOGRAM N/A 03/13/2011   Procedure: LEFT HEART CATHETERIZATION WITH CORONARY ANGIOGRAM;  Surgeon: Lorretta Harp, MD;  Location: [facility];  Service: Cardiovascular;  Laterality: N/A;    Family History: Family History  Problem Relation Age of Onset  . Coronary artery disease Father   . Hypertension Father   . Hypertension Sister   . Coronary artery disease Brother   . Emphysema Brother   . Coronary artery disease Mother   . Hypertension Mother   . Emphysema Mother   . Hypertension Daughter   . Obesity Daughter   . Diabetes    . Hypertension    . Coronary artery disease      Social History: Social History   Social History  . Marital status: Married    Spouse name: N/A  . Number of children: N/A  . Years of education: N/A   Social History Main Topics  . Smoking status: Current Every Day Smoker    Packs/day: 0.20    Years: 30.00    Types: Cigars  . Smokeless tobacco: Never Used     Comment: Used to smoke cigarettes. Pt is currently smoking 3 black & mild cigars per day. He has cut down but never quit.  . Alcohol use No  . Drug use: No  . Sexual activity: Not Asked   Other Topics Concern  . None   Social  History Narrative  . None    Allergies:  Allergies  Allergen Reactions  . Plavix [Clopidogrel Bisulfate] Hives    Objective:    Vital Signs:   Temp:  [97.7 F (36.5 C)-97.9 F (36.6 C)] 97.9 F (36.6 C) (04/05 1317) Pulse Rate:  [52-81] 81 (04/05 1317) Resp:  [13-22] 18 (04/05 1317) BP: (76-127)/(42-88) 96/46 (04/05 1326) SpO2:  [96 %-100 %] 100 % (04/05 1317) Weight:  [152 lb 9.6 oz (69.2 kg)] 152 lb 9.6 oz (69.2 kg) (04/05 0109) Last BM Date: 07/27/16  Weight change: Filed Weights   07/28/16 0109  Weight: 152 lb 9.6 oz (69.2 kg)    Intake/Output:   Intake/Output Summary (Last 24 hours) at 07/28/16 1413 Last data filed at 07/28/16 0730  Gross per 24 hour  Intake              360 ml  Output                0 ml  Net              360 ml     Physical Exam: General:  Well appearing. No resp difficulty. In bed  HEENT: normal Neck: supple. JVP 6-7 . Carotids 2+ bilat; no bruits. No lymphadenopathy or thyromegaly appreciated. Cor: PMI nondisplaced. Regular rate & rhythm. No rubs, gallops or murmurs. Lungs: clear Abdomen: soft, nontender, nondistended. No hepatosplenomegaly. No bruits or masses. Good bowel sounds. Extremities: no cyanosis, clubbing, rash, edema Neuro: alert & orientedx3, cranial nerves grossly intact. moves all 4 extremities w/o difficulty. Affect pleasant  Telemetry: NSR, PVCs, NSVT 70-80s. Personally reviewed.   Labs: Basic Metabolic Panel:  Recent Labs Lab 07/27/16 1930 07/28/16 0636  NA 133* 136  K 3.4* 3.4*  CL 95* 98*  CO2 24 28  GLUCOSE 88 77  BUN 20 18  CREATININE 2.25* 2.20*  CALCIUM 9.5 9.6  MG 2.4 2.3  PHOS 3.1  --     Liver Function Tests: No results for input(s): AST, ALT, ALKPHOS, BILITOT, PROT, ALBUMIN in the last 168 hours. No  results for input(s): LIPASE, AMYLASE in the last 168 hours. No results for input(s): AMMONIA in the last 168 hours.  CBC:  Recent Labs Lab 07/27/16 1930  WBC 13.7*  HGB 15.2  HCT 45.7   MCV 82.5  PLT 255    Cardiac Enzymes:  Recent Labs Lab 07/28/16 0106 07/28/16 0636 07/28/16 1212  TROPONINI 0.09* 0.10* 0.08*    BNP: BNP (last 3 results)  Recent Labs  05/03/16 0435 05/19/16 1554 07/27/16 1930  BNP 1,034.8* 470.0* 321.6*    ProBNP (last 3 results) No results for input(s): PROBNP in the last 8760 hours.   CBG: No results for input(s): GLUCAP in the last 168 hours.  Coagulation Studies: No results for input(s): LABPROT, INR in the last 72 hours.  Other results: EKG: NSR 84 bpm PVCs LBBB.   Imaging: Dg Chest Port 1 View  Result Date: 07/27/2016 CLINICAL DATA:  Palpitations, shortness of breath, and dizziness today, history hypertension, CHF, coronary artery disease post PTCA, ischemic cardiomyopathy EXAM: PORTABLE CHEST 1 VIEW COMPARISON:  Portable exam 1932 hours compared 04/30/2016 FINDINGS: Upper normal size of cardiac silhouette with note of coronary arterial stent. Mediastinal contours and pulmonary vascularity normal. Mild central peribronchial thickening. Lungs clear. No pleural effusion or pneumothorax. Bones unremarkable. IMPRESSION: Mild bronchitic changes without infiltrate. Electronically Signed   By: Lavonia Dana M.D.   On: 07/27/2016 19:50      Medications:     Current Medications: . aspirin  81 mg Oral Daily  . [START ON 07/29/2016] digoxin  0.125 mg Oral QODAY  . heparin  5,000 Units Subcutaneous Q8H  . isosorbide-hydrALAZINE  0.5 tablet Oral BID  . ivabradine  2.5 mg Oral BID WC  . multivitamin with minerals  1 tablet Oral Daily  . rosuvastatin  20 mg Oral q1800  . sodium chloride flush  3 mL Intravenous Q12H  . ticagrelor  90 mg Oral BID     Infusions:    Assessment/Plan  David [name] is a 51 year old with known ICM, chronic systolic heart failure, and frequent PVCs admitted last night with presyncope concerning for VT.   1. Presyncope - concerning for VT. EP consult pending for possible CRT-D . Repeat ECHO pending.  2.  NSVT- Mag 2.3 K 3.4 . K was supplemented. BMET in am.  3. Chronic Systolic Heart Failure -  ICM ECHO 04/2016 EF 10-15%. Repeat ECHO pending.  Volume status stable.  Hold lasix, spiro, and dig with elevated creatinine.  Continue corlanor. May need to stop bidil for low BP.  No bb with soft bp.  4. CAD- S/P Stent RCA, LAD 04/2016. Troponin negative. Continue ticagrelor + aspirin.  5. AKI- Creatinine on admit 2.25 ? I wonder if he was hypotensive yesterday. Holding diuretics + dig as above. Will need to watch. Repeat BMET in am.  6. LBBB  Length of Stay: 0  Darrick Grinder, NP  07/28/2016, 2:13 PM  Advanced Heart Failure Team Pager [phone] (M-F; 7a - 4p)  Please contact [facility] for night-coverage after hours (4p -7a ) and weekends on [email or web address]  Patient seen and examined with Darrick Grinder, NP. We discussed all aspects of the encounter. I agree with the assessment and plan as stated above.   David. [name] is well known to me from [facility] and recent prolonged admit for cardiogenic shock and 2v CAD.   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 Suspect part of his CM may be related to frequent  PVCs. Has been following closely with Dr. Caryl Comes. Had noninducible EPS recently. Started on mexilitene but more recently switchhed to [facility] which has made him feel worse.   Admitted with sudden onset of palpitations and presyncope which is concerning for hemodynamically-significant sustained VT. Also has AKI which makes me think he was transiently hypotensive as well. On tele continues with extensive ventricular ectopy with multifocal PVCs and frequent NSVT.   On exam, currently well compensated and perfused. No JVD. Lungs clear. Legs warm without edema.   I think there are 3 main issues  1) Need to find an AA agent to help control his ventricular ectopy. EP has seen (thank you) and suggested addition of ranolazine which has been started.  2) Suspect yesterday's episode was  self-terminated VT and I feel strongly that he likely needs ICD (preferrably CRT-D - [employer]?). I have d/w Dr. Caryl Comes who will see him tomorrow to discuss.  3) Is he nearing the point where he needs advanced therapies or will he improve if we can control his ectopy?   Glori Bickers, MD  10:34 PM

## 2016-07-28 NOTE — Progress Notes (Signed)
Troponin 0.09

## 2016-07-28 NOTE — Consult Note (Signed)
ELECTROPHYSIOLOGY CONSULT NOTE    Patient ID: David [name] MRN: [id number], DOB/AGE: [birth year] 51 y.o.  Admit date: 07/27/2016 Date of Consult: 07/28/2016   Primary Physician: Gilford Rile, MD Primary Cardiologist: Dr. Haroldine Laws Electrophysiologist: Dr. Caryl Comes  Reason for Consultation: PVCs/NSVT  HPI: David [name] is a 51 y.o. male who is being seen today for the evaluation of NSVT at the request of Dr. Haroldine Laws.   PMhx is noted for CAD with recent hx in January 2018hospitalized in transfer from [facility] where he had presented with atrial fibrillation w rapid rate and was treated with amiodarone. He developed bradycardia and suffered a (cardiac/bradycardia versus PE) arrest  Underwent cath >>stenting 3 including  LAD RCA and posterolateral branch. Ejection fraction was 25% he was obseved to have what Dr. Caryl Comes described as complex and frequent V ectopy underwent EPS 06/24/16 that was negative and given his recation to the amiodarone started on Mexiletine and Ranexa withplans to follow EF after 57months of medical management post PCI, if no improvement plan for ICD.   He saw Dr. Caryl Comes 07/19/16, he discussed the patient as having complex ventricular ectopy and 16% PVC burden, and in discussion with Dr. Haroldine Laws, while there were concerns initially about amiodarone given his temporal association with the aforementioned PEA arrest, it was felt that it would be a reasonable alternative as mexiletine.  There was discussion of initiating carvedilol eventually, but planned for one drug at a t ime.  Further PMHx includes CRI, HTN, CHF, ICM.  The patient was admitted yesterday with c/o generalized fatigue with the swith to amiodarone and increasing palpitations, yesterday promted to come with a sudden feeling like his heart has had stopped associated with the sensation like he could't breathe, he denies CP to me.  NO syncope but felt very lightheaded.  This lasted several seconds.  LABS: K+  3.4 BUN/Creat 18/2.20 Mag  Trop I 0.10, 0.08 TSH 2.148 WBC 13.7 H/H 15/45 plts 255   Past Medical History:  Diagnosis Date  . CAD (coronary artery disease) 2009   AMI in 12/2007 with PCI to LAD, staged PCI to  mid/distal RCA, NSTEMI in 02/2009 with BMS to LCx  . CHF (congestive heart failure) ([place])   . HLD (hyperlipidemia)   . HTN (hypertension)   . Ischemic cardiomyopathy    Admitted in 07/2010 with CHF exacerbation     Surgical History:  Past Surgical History:  Procedure Laterality Date  . CARDIAC CATHETERIZATION N/A 05/05/2016   Procedure: Right/Left Heart Cath and Coronary Angiography;  Surgeon: Jolaine Artist, MD;  Location: [place];  Service: Cardiovascular;  Laterality: N/A;  . CARDIAC CATHETERIZATION N/A 05/09/2016   Procedure: Coronary Stent Intervention;  Surgeon: Peter M Martinique, MD;  Location: [place] [facility];  Service: Cardiovascular;  Laterality: N/A;  . CARDIAC CATHETERIZATION N/A 05/09/2016   Procedure: Intravascular Pressure Wire/FFR Study;  Surgeon: Peter M Martinique, MD;  Location: [place] [facility];  Service: Cardiovascular;  Laterality: N/A;  . ELECTROPHYSIOLOGY STUDY N/A 06/24/2016   Procedure: Electrophysiology Study;  Surgeon: Deboraha Sprang, MD;  Location: [place] [facility];  Service: Cardiovascular;  Laterality: N/A;  . ICD IMPLANT N/A 06/24/2016   Procedure: POSSIBLE  ICD Implant;  Surgeon: Deboraha Sprang, MD;  Location: [place] [facility];  Service: Cardiovascular;  Laterality: N/A;  . LEFT HEART CATHETERIZATION WITH CORONARY ANGIOGRAM N/A 03/13/2011   Procedure: LEFT HEART CATHETERIZATION WITH CORONARY ANGIOGRAM;  Surgeon: Lorretta Harp, MD;  Location: [facility] CATH LAB;  Service: Cardiovascular;  Laterality: N/A;     Prescriptions Prior to Admission  Medication Sig Dispense Refill Last Dose  . amiodarone (PACERONE) 200 MG tablet Take 1 tablet (200 mg total) by mouth 2 (two) times daily. 60 tablet 6 07/27/2016 at Unknown time  . aspirin 81 MG  chewable tablet Chew 1 tablet (81 mg total) by mouth daily. 30 tablet 6 07/27/2016 at Unknown time  . digoxin (LANOXIN) 0.125 MG tablet Take 1 tablet (0.125 mg total) by mouth every other day.   07/27/2016 at Unknown time  . diphenhydrAMINE (BENADRYL) 25 MG tablet Take 12.5 mg by mouth every 6 (six) hours as needed for itching or allergies.   Past Month at Unknown time  . diphenhydrAMINE-zinc acetate (BENADRYL) cream Apply 1 application topically daily as needed for itching.   Past Month at Unknown time  . furosemide (LASIX) 40 MG tablet Take 1 tablet (40 mg total) by mouth daily. Take extra 40 mg as needed for weight gain of 3 lbs overnight or 5 lbs one week. 40 tablet 6 07/27/2016 at Unknown time  . isosorbide-hydrALAZINE (BIDIL) 20-37.5 MG tablet Take 0.5 tablets by mouth 3 times/day as needed-between meals & bedtime. (Patient taking differently: Take 0.5 tablets by mouth 2 (two) times daily. ) 30 tablet 6 07/27/2016 at Unknown time  . ivabradine (CORLANOR) 5 MG TABS tablet Take 0.5 tablets (2.5 mg total) by mouth 2 (two) times daily with a meal. 60 tablet 6 07/27/2016 at Unknown time  . Multiple Vitamins-Minerals (ADULT GUMMY) CHEW Chew 2 tablets by mouth daily.   07/27/2016 at Unknown time  . rosuvastatin (CRESTOR) 20 MG tablet Take 1 tablet (20 mg total) by mouth daily at 6 PM. 30 tablet 6 07/26/2016 at Unknown time  . spironolactone (ALDACTONE) 25 MG tablet Take 1 tablet (25 mg total) by mouth daily. 30 tablet 6 07/27/2016 at Unknown time  . ticagrelor (BRILINTA) 90 MG TABS tablet Take 1 tablet (90 mg total) by mouth 2 (two) times daily. 60 tablet 6 07/27/2016 at Unknown time    Inpatient Medications:  . aspirin  81 mg Oral Daily  . [START ON 07/29/2016] digoxin  0.125 mg Oral QODAY  . heparin  5,000 Units Subcutaneous Q8H  . isosorbide-hydrALAZINE  0.5 tablet Oral BID  . ivabradine  2.5 mg Oral BID WC  . multivitamin with minerals  1 tablet Oral Daily  . rosuvastatin  20 mg Oral q1800  . sodium chloride  flush  3 mL Intravenous Q12H  . ticagrelor  90 mg Oral BID    Allergies:  Allergies  Allergen Reactions  . Plavix [Clopidogrel Bisulfate] Hives    Social History   Social History  . Marital status: Married    Spouse name: N/A  . Number of children: N/A  . Years of education: N/A   Occupational History  . Not on file.   Social History Main Topics  . Smoking status: Current Every Day Smoker    Packs/day: 0.20    Years: 30.00    Types: Cigars  . Smokeless tobacco: Never Used     Comment: Used to smoke cigarettes. Pt is currently smoking 3 black & mild cigars per day. He has cut down but never quit.  . Alcohol use No  . Drug use: No  . Sexual activity: Not on file   Other Topics Concern  . Not on file   Social History Narrative  . No narrative on file     Family History  Problem  Relation Age of Onset  . Coronary artery disease Father   . Hypertension Father   . Hypertension Sister   . Coronary artery disease Brother   . Emphysema Brother   . Coronary artery disease Mother   . Hypertension Mother   . Emphysema Mother   . Hypertension Daughter   . Obesity Daughter   . Diabetes    . Hypertension    . Coronary artery disease       Review of Systems: All other systems reviewed and are otherwise negative except as noted above.  Physical Exam: Vitals:   07/27/16 2245 07/28/16 0109 07/28/16 0530 07/28/16 1042  BP: 111/68 112/62 (!) 103/55 103/60  Pulse: 69 75 72 73  Resp: 14 14 18    Temp:  97.9 F (36.6 C) 97.9 F (36.6 C)   TempSrc:  Oral Oral   SpO2: 98% 100% 100%   Weight:  152 lb 9.6 oz (69.2 kg)    Height:  5\' 5"  (1.651 m)      GEN- The patient is well appearing, alert and oriented x 3 today.   HEENT: normocephalic, atraumatic; sclera clear, conjunctiva pink; hearing intact; oropharynx clear; neck supple, no JVP Lymph- no cervical lymphadenopathy Lungs- CTA, normal work of breathing.  No wheezes, rales, rhonchi Heart- RRR, no murmurs, rubs or  gallops, PMI not laterally displaced GI- soft, non-tender, non-distended Extremities- no clubbing, cyanosis, or edema MS- no significant deformity or atrophy Skin- warm and dry, no rash or lesion Psych- euthymic mood, full affect Neuro- no gross deficits observed  Labs:   Lab Results  Component Value Date   WBC 13.7 (H) 07/27/2016   HGB 15.2 07/27/2016   HCT 45.7 07/27/2016   MCV 82.5 07/27/2016   PLT 255 07/27/2016    Recent Labs Lab 07/28/16 0636  NA 136  K 3.4*  CL 98*  CO2 28  BUN 18  CREATININE 2.20*  CALCIUM 9.6  GLUCOSE 77      Radiology/Studies:  Dg Chest Port 1 View Result Date: 07/27/2016 CLINICAL DATA:  Palpitations, shortness of breath, and dizziness today, history hypertension, CHF, coronary artery disease post PTCA, ischemic cardiomyopathy EXAM: PORTABLE CHEST 1 VIEW COMPARISON:  Portable exam 1932 hours compared 04/30/2016 FINDINGS: Upper normal size of cardiac silhouette with note of coronary arterial stent. Mediastinal contours and pulmonary vascularity normal. Mild central peribronchial thickening. Lungs clear. No pleural effusion or pneumothorax. Bones unremarkable. IMPRESSION: Mild bronchitic changes without infiltrate. Electronically Signed   By: Lavonia Dana M.D.   On: 07/27/2016 19:50    Reviewed by myself: EKG: #1, SR, 84bpm, LBBB, 1st degree AVblock, PR 263ms, QRS 153ms #2 SR, 76bpm, LBBB, 1st degree AVblock, PR 234ms, QRS 161ms TELEMETRY: SR, 60's frequent PVCs, occ NSVT up to 8 beats noted  06/24/16: EPS, Dr. Caryl Comes IMPRESSION: 1. Normal sinus function. 2. Normal atrial function. 3. Normal atrioventricular nodal function. 4. Normal His-Purkinje system function. 5. No accessory pathway. 6. Ventricular stimulation resulted in ventricular fibrillation that     was self terminating after about 10 seconds and ventricular flutter     that required cardioversion/defibrillation. SUMMARY:  These are nonspecific findings with triple  ventricular extrastimuli and as such, this EP study is considered negative.  We Lamae Fosco continue him on medical therapy for his ischemic cardiomyopathy.  Given his complex and frequent ventricular ectopy, we Gwenivere Hiraldo begin him on antiarrhythmic therapy.  He has a history of a reaction to amiodarone, which is unlikely to be causal.  However, given the concerns,  we Tinesha Siegrist attempt to use with mexiletine and ranolazine if necessary for ventricular ectopy suppression.  Following suppression of ectopy and 3 months of guideline directed medical therapy, if ejection fraction remains poor, ICD implantation Quatavious Rossa be considered.  05/02/16: TTE Study Conclusions - Left ventricle: LVEF is severely depressed at 15 to 20% with   diffuse hypokinesis; inferior and posterior akinesis. The cavity   size was moderately dilated. Wall thickness was normal. - Aortic valve: There was trivial regurgitation. - Mitral valve: There was moderate regurgitation. - Left atrium: The atrium was moderately dilated. - Right ventricle: The cavity size was mildly dilated. - Right atrium: The atrium was mildly dilated    Assessment and Plan:    1. Dizziness     NSVT, frequent PVC's     On amiodarone     Negative EPS study last month     Dohn Stclair check an echo  Patient reports he felt much better on the mexiletine, that the amiodarone made him tired and a clear increase in his palpitations, has been held here.  Dr. Curt Bears to see patient, mexiletine did not control his ectopy, and amiodarone made him feel worse, Anadia Helmes discuss with MD.   2. CAD     No c/o CP     On ASA, Brilinta, statin     Abnormal Troponin, CRI likely contributes, EKGs do not have clear changes from previous     Defer to Dr. Haroldine Laws   3. HTN     Controlled, occassionally relative hypotensive readings   4. ICM, chronic CHF     Exam/xray appear copmpensated     On dig, Corlanor, Bidil, aldactone    Signed, Tommye Standard, PA-C 07/28/2016 12:56  PM   I have seen and examined this patient with Tommye Standard.  Agree with above, note added to reflect my findings.  On exam, RRR, no murmurs, lungs clear.   Previous he presented to the hospital in atrial fibrillation with rapid rates and was treated with amiodarone. Developed bradycardia and suffered a cardiac or bradycardic arrest. He underwent cath with stenting 3 to the LAD, RCA and posterolateral. Ejection fraction of the time was found to be 25%. He has ventricular ectopy, which has been frequent. He was wrestling started on amiodarone 200 mg twice a day. He says that he has felt very poorly on the amiodarone. Previously he is felt mexiletine. As he has not tolerated either of these medications, Jaynell Castagnola try him on ranolazine to see if this Panhia Karl help his PVC burden.  Jowan Skillin M. Jovon Winterhalter MD 07/28/2016 6:24 PM

## 2016-07-28 NOTE — Progress Notes (Signed)
PA aware of 8 beat run of v-tach. No new orders at this time.  Jennessa Trigo, Mervin Kung RN

## 2016-07-28 NOTE — Progress Notes (Signed)
  Patient well known to HF team from previous admission. For cardiogenic shock and CAD. Underwent 2v PCI.   Has been following with Dr. Caryl Comes for PVCs and NSVT. Failed mexilitene. Now on amiodarone.  Admitted with 20-30 second episode of palpitations and dizziness with presyncope suspicious for VT.   On monitor extensive ventricular ectopy with NSVT and PVCs.  Full HF Consult pending.   I have asked EP to see for AA recommendations and consideration of CRT-D.   Glori Bickers, MD  10:01 AM

## 2016-07-28 NOTE — Progress Notes (Signed)
Progress Note    David [name]  UUV:[id number] DOB: 28-Jun-1965  DOA: 07/27/2016 PCP: David Rile, MD    Brief Narrative:   Chief complaint: Follow-up chest pain and palpitations  Medical records reviewed and are as summarized below:  David [name] is an 51 y.o. male with medical history significant for coronary artery disease with stents, chronic systolic CHF with EF 47-42%, chronic kidney disease stage II, and hypertension presenting to the emergency department with intermittent chest pains and palpitations. Patient has a complex cardiac history with stents placed in 2009 and 2010, and admission in January of this year following a PEA arrest, resulting in additional stenting. He was evaluated for ICD placement but determined not to be a candidate per the patient's report. He was evaluated by his cardiologist on 07/19/2016 noted to still be having 16% PVCs, , and mexiletine was changed to amiodarone. Patient started taking amiodarone on 07/24/2016 and began to notice some mild general malaise and weakness the following day. He also began to note some worsening palpitations and the sensation as though his heart was stopping for a few seconds. He describes an episode just prior to his presentation in which she was walking and experienced acute onset of severe central chest pain and shortness of breath with a sensation that his heart had stopped. He remarked to his wife that he felt like he was dying, and at her urging, he came into the ED for evaluation. The episode resolved in less than a minute and he is felt fatigued since, but with no residual chest pain or significant dyspnea. There's been no recent fevers or chills and no lower extremity swelling.   ED Course: Upon arrival to the ED, patient was found to be afebrile, saturating well on room air, and with vital signs stable. EKG featured  sinus rhythm with first degree AV nodal block, PVCs, and left bundle branch block which was also seen  previously. Chemistry panels notable for sodium of 133, potassium 3.4, and serum creatinine of 2.25, up from an apparent baseline of 1.3. CBC is notable for a leukocytosis to 13,700. Troponin was within the normal limits and BNP was elevated to 322. Runs of nonsustained ventricular tachycardia were noted during the ED stay. Cardiology was consulted by the ED physician and a medical admission was advised. Patient will be observed on the telemetry unit for ongoing evaluation and management of chest pain and palpitations in the setting of nonsustained VT in a patient with EF 15-20% on recent echo.   Assessment/Plan:   Principal Problem: Palpitations associated with shortness of breath likely from NSVT/demand ischemia Patient denied chest pain to me. Mild troponin elevation noted. EKG with sinus rhythm, 1 degree AV block, PVC's, and known LBBB. Noted to have episodes of NSVT, and was recently evaluated for ICD placement. Cardiology following with plans for electrophysiology evaluation. Replete potassium and keep level around 4. Continue aspirin, Brilinta and Crestor. Chest x-ray personally reviewed and shows no evidence of pulmonary edema or infiltrates. EP to see for AA recommendations and consideration of CRT-D.   Active problems:  Chronic systolic CHF  TTE (09/01/54) with EF 15-20%, diffuse HK, inferior and posterior AK, moderate MR, and moderate LAE. Continue digoxin. Diuretics currently on hold due to acute kidney injury but would have low threshold to resume. Continue to monitor fluid balance closely.   CAD  Pt has known CAD with stents. No complaints of chest pain. Continue ASA 81, Brilinta, Crestor, ivabridine.   Acute kidney injury  superimposed on CKD stage II  SCr is 2.25 on admission, up from apparent baseline of ~1.3. Diuretics on hold.   Paroxysmal atrial fibrillation  Currently in sinus rhythm with frequent PVC's/NSVT noted on admission. CHADS-VASc at least 2 (CAD, CHF). Followed by  cardiology and no longer on anticoagulant. Monitoring on telemetry.  HIV screening The patient falls between the ages of 13-64 and should be screened for HIV, therefore HIV testing ordered.  Hypokalemia Replaced potassium. Magnesium WNL.   Family Communication/Anticipated D/C date and plan/Code Status   DVT prophylaxis: Heparin ordered. Code Status: Full Code.  Family Communication: No family currently at the bedside. Disposition Plan: Home when cleared by cardiology.   Medical Consultants:    Cardiology   Procedures:    None  Anti-Infectives:    None  Subjective:   Denies that he had chest pain, felt dizzy and feint, then started gasping for breath.  Lasted 20-30 sec then spontaneously went away and has not come back.  No associated nausea or diaphoresis.   Objective:    Vitals:   07/27/16 2230 07/27/16 2245 07/28/16 0109 07/28/16 0530  BP: 127/77 111/68 112/62 (!) 103/55  Pulse: 71 69 75 72  Resp: 18 14 14 18   Temp:   97.9 F (36.6 C) 97.9 F (36.6 C)  TempSrc:   Oral Oral  SpO2: 96% 98% 100% 100%  Weight:   69.2 kg (152 lb 9.6 oz)   Height:   5\' 5"  (1.651 m)    No intake or output data in the 24 hours ending 07/28/16 0820 Filed Weights   07/28/16 0109  Weight: 69.2 kg (152 lb 9.6 oz)    Exam: General exam: Appears calm and comfortable.  Respiratory system: Clear to auscultation. Respiratory effort normal. Cardiovascular system: S1 & S2 heard, RRR. No JVD,  rubs, gallops or clicks. No murmurs. Gastrointestinal system: Abdomen is nondistended, soft and nontender. No organomegaly or masses felt. Normal bowel sounds heard. Central nervous system: Alert and oriented. No focal neurological deficits. Extremities: No clubbing,  or cyanosis. No edema. Skin: No rashes, lesions or ulcers. Psychiatry: Judgement and insight appear normal. Mood & affect appropriate.   Data Reviewed:   I have personally reviewed following labs and imaging  studies:  Labs: Basic Metabolic Panel:  Recent Labs Lab 07/27/16 1930 07/28/16 0636  NA 133* 136  K 3.4* 3.4*  CL 95* 98*  CO2 24 28  GLUCOSE 88 77  BUN 20 18  CREATININE 2.25* 2.20*  CALCIUM 9.5 9.6  MG 2.4 2.3  PHOS 3.1  --    GFR Estimated Creatinine Clearance: 34.2 mL/min (A) (by C-G formula based on SCr of 2.2 mg/dL (H)). Liver Function Tests: No results for input(s): AST, ALT, ALKPHOS, BILITOT, PROT, ALBUMIN in the last 168 hours. No results for input(s): LIPASE, AMYLASE in the last 168 hours. No results for input(s): AMMONIA in the last 168 hours. Coagulation profile No results for input(s): INR, PROTIME in the last 168 hours.  CBC:  Recent Labs Lab 07/27/16 1930  WBC 13.7*  HGB 15.2  HCT 45.7  MCV 82.5  PLT 255   Cardiac Enzymes:  Recent Labs Lab 07/28/16 0106 07/28/16 0636  TROPONINI 0.09* 0.10*   Thyroid function studies:  Recent Labs  07/28/16 0325  TSH 2.148   Microbiology No results found for this or any previous visit (from the past 240 hour(s)).  Radiology: Dg Chest Port 1 View  Result Date: 07/27/2016 CLINICAL DATA:  Palpitations, shortness of breath, and  dizziness today, history hypertension, CHF, coronary artery disease post PTCA, ischemic cardiomyopathy EXAM: PORTABLE CHEST 1 VIEW COMPARISON:  Portable exam 1932 hours compared 04/30/2016 FINDINGS: Upper normal size of cardiac silhouette with note of coronary arterial stent. Mediastinal contours and pulmonary vascularity normal. Mild central peribronchial thickening. Lungs clear. No pleural effusion or pneumothorax. Bones unremarkable. IMPRESSION: Mild bronchitic changes without infiltrate. Electronically Signed   By: Lavonia Dana M.D.   On: 07/27/2016 19:50    Medications:   . aspirin  81 mg Oral Daily  . [START ON 07/29/2016] digoxin  0.125 mg Oral QODAY  . heparin  5,000 Units Subcutaneous Q8H  . isosorbide-hydrALAZINE  0.5 tablet Oral BID  . ivabradine  2.5 mg Oral BID WC  .  multivitamin with minerals  1 tablet Oral Daily  . rosuvastatin  20 mg Oral q1800  . sodium chloride flush  3 mL Intravenous Q12H  . ticagrelor  90 mg Oral BID   Continuous Infusions:  Medical decision making is of high complexity and this patient is at high risk of deterioration, therefore this is a level 3 visit.  (> 4 problem points, >4 data points)    LOS: 0 days   Kailynn Satterly  [facility] Pager [phone]. If unable to reach me by pager, please call my cell phone at [phone].  *Please refer to [email or web address], password [email or web address] to get updated schedule on who will round on this patient, as hospitalists switch teams weekly. If 7PM-7AM, please contact night-coverage at [email or web address], password [email or web address] for any overnight needs.  07/28/2016, 8:20 AM

## 2016-07-28 NOTE — Care Management Obs Status (Signed)
[facility] NOTIFICATION   Patient Details  Name: David [name] MRN: [id number] Date of Birth: 05-14-1965   Medicare Observation Status Notification Given:  Yes    Dawayne Patricia, RN 07/28/2016, 4:57 PM

## 2016-07-29 ENCOUNTER — Observation Stay (HOSPITAL_BASED_OUTPATIENT_CLINIC_OR_DEPARTMENT_OTHER): Payer: Medicare HMO

## 2016-07-29 DIAGNOSIS — Z955 Presence of coronary angioplasty implant and graft: Secondary | ICD-10-CM | POA: Diagnosis not present

## 2016-07-29 DIAGNOSIS — I130 Hypertensive heart and chronic kidney disease with heart failure and stage 1 through stage 4 chronic kidney disease, or unspecified chronic kidney disease: Secondary | ICD-10-CM | POA: Diagnosis not present

## 2016-07-29 DIAGNOSIS — Z7982 Long term (current) use of aspirin: Secondary | ICD-10-CM | POA: Diagnosis not present

## 2016-07-29 DIAGNOSIS — Z8249 Family history of ischemic heart disease and other diseases of the circulatory system: Secondary | ICD-10-CM | POA: Diagnosis not present

## 2016-07-29 DIAGNOSIS — F17290 Nicotine dependence, other tobacco product, uncomplicated: Secondary | ICD-10-CM | POA: Diagnosis present

## 2016-07-29 DIAGNOSIS — I472 Ventricular tachycardia: Secondary | ICD-10-CM | POA: Diagnosis not present

## 2016-07-29 DIAGNOSIS — I5022 Chronic systolic (congestive) heart failure: Secondary | ICD-10-CM | POA: Diagnosis not present

## 2016-07-29 DIAGNOSIS — N179 Acute kidney failure, unspecified: Secondary | ICD-10-CM

## 2016-07-29 DIAGNOSIS — Z825 Family history of asthma and other chronic lower respiratory diseases: Secondary | ICD-10-CM | POA: Diagnosis not present

## 2016-07-29 DIAGNOSIS — I252 Old myocardial infarction: Secondary | ICD-10-CM | POA: Diagnosis not present

## 2016-07-29 DIAGNOSIS — Z7902 Long term (current) use of antithrombotics/antiplatelets: Secondary | ICD-10-CM | POA: Diagnosis not present

## 2016-07-29 DIAGNOSIS — R55 Syncope and collapse: Secondary | ICD-10-CM

## 2016-07-29 DIAGNOSIS — I429 Cardiomyopathy, unspecified: Secondary | ICD-10-CM | POA: Diagnosis not present

## 2016-07-29 DIAGNOSIS — I248 Other forms of acute ischemic heart disease: Secondary | ICD-10-CM | POA: Diagnosis not present

## 2016-07-29 DIAGNOSIS — I447 Left bundle-branch block, unspecified: Secondary | ICD-10-CM | POA: Diagnosis not present

## 2016-07-29 DIAGNOSIS — D72829 Elevated white blood cell count, unspecified: Secondary | ICD-10-CM | POA: Diagnosis present

## 2016-07-29 DIAGNOSIS — N182 Chronic kidney disease, stage 2 (mild): Secondary | ICD-10-CM | POA: Diagnosis not present

## 2016-07-29 DIAGNOSIS — I440 Atrioventricular block, first degree: Secondary | ICD-10-CM | POA: Diagnosis not present

## 2016-07-29 DIAGNOSIS — I2510 Atherosclerotic heart disease of native coronary artery without angina pectoris: Secondary | ICD-10-CM | POA: Diagnosis not present

## 2016-07-29 DIAGNOSIS — E876 Hypokalemia: Secondary | ICD-10-CM | POA: Diagnosis present

## 2016-07-29 DIAGNOSIS — I255 Ischemic cardiomyopathy: Secondary | ICD-10-CM | POA: Diagnosis present

## 2016-07-29 DIAGNOSIS — I959 Hypotension, unspecified: Secondary | ICD-10-CM | POA: Diagnosis not present

## 2016-07-29 DIAGNOSIS — I480 Paroxysmal atrial fibrillation: Secondary | ICD-10-CM | POA: Diagnosis not present

## 2016-07-29 DIAGNOSIS — I259 Chronic ischemic heart disease, unspecified: Secondary | ICD-10-CM | POA: Diagnosis not present

## 2016-07-29 DIAGNOSIS — N170 Acute kidney failure with tubular necrosis: Secondary | ICD-10-CM | POA: Diagnosis not present

## 2016-07-29 DIAGNOSIS — E785 Hyperlipidemia, unspecified: Secondary | ICD-10-CM | POA: Diagnosis present

## 2016-07-29 DIAGNOSIS — Z79899 Other long term (current) drug therapy: Secondary | ICD-10-CM | POA: Diagnosis not present

## 2016-07-29 LAB — BASIC METABOLIC PANEL
ANION GAP: 8 (ref 5–15)
BUN: 12 mg/dL (ref 6–20)
CALCIUM: 9.3 mg/dL (ref 8.9–10.3)
CO2: 25 mmol/L (ref 22–32)
CREATININE: 2.04 mg/dL — AB (ref 0.61–1.24)
Chloride: 103 mmol/L (ref 101–111)
GFR calc non Af Amer: 36 mL/min — ABNORMAL LOW (ref >60)
GFR, EST AFRICAN AMERICAN: 41 mL/min — AB (ref >60)
Glucose, Bld: 122 mg/dL — ABNORMAL HIGH (ref 65–99)
Potassium: 4.1 mmol/L (ref 3.5–5.1)
SODIUM: 136 mmol/L (ref 135–145)

## 2016-07-29 LAB — ECHOCARDIOGRAM COMPLETE
CHL CUP DOP CALC LVOT VTI: 23.5 cm
CHL CUP MV DEC (S): 158
E decel time: 158 msec
E/e' ratio: 11.8
HEIGHTINCHES: 65 in
LA diam end sys: 48 mm
LA diam index: 2.65 cm/m2
LA vol A4C: 82.4 ml
LA vol index: 41.1 mL/m2
LASIZE: 48 mm
LAVOL: 74.3 mL
LDCA: 2.54 cm2
LV E/e' medial: 11.8
LV E/e'average: 11.8
LV TDI E'LATERAL: 7.48
LV dias vol index: 161 mL/m2
LV sys vol index: 126 mL/m2
LV sys vol: 228 mL — AB (ref 21–61)
LVDIAVOL: 291 mL — AB (ref 62–150)
LVELAT: 7.48 cm/s
LVOT SV: 60 mL
LVOT peak grad rest: 6 mmHg
LVOT peak vel: 124 cm/s
LVOTD: 18 mm
Lateral S' vel: 11.6 cm/s
MV Peak grad: 3 mmHg
MV pk A vel: 66.9 m/s
MV pk E vel: 88.3 m/s
P 1/2 time: 340 ms
Simpson's disk: 22
Stroke v: 63 ml
TAPSE: 23.5 mm
TDI e' medial: 5.12
WEIGHTICAEL: 2480 [oz_av]

## 2016-07-29 LAB — HIV ANTIBODY (ROUTINE TESTING W REFLEX): HIV SCREEN 4TH GENERATION: NONREACTIVE

## 2016-07-29 LAB — UREA NITROGEN, URINE: Urea Nitrogen, Ur: 816 mg/dL

## 2016-07-29 MED ORDER — AMIODARONE HCL 200 MG PO TABS
200.0000 mg | ORAL_TABLET | Freq: Once | ORAL | Status: AC
  Administered 2016-07-29: 200 mg via ORAL
  Filled 2016-07-29: qty 1

## 2016-07-29 MED ORDER — AMIODARONE HCL 200 MG PO TABS
200.0000 mg | ORAL_TABLET | Freq: Three times a day (TID) | ORAL | Status: DC
  Administered 2016-07-29 – 2016-08-02 (×12): 200 mg via ORAL
  Filled 2016-07-29 (×12): qty 1

## 2016-07-29 NOTE — Progress Notes (Signed)
  Echocardiogram 2D Echocardiogram has been performed.  David [name] L Androw 07/29/2016, 2:07 PM

## 2016-07-29 NOTE — Progress Notes (Addendum)
SUBJECTIVE: The patient is  doing well today.  At this time, he denies chest pain, shortness of breath, or any new concerns. Retelling story of amio, took one dose on Sunday and felt terrible  . aspirin  81 mg Oral Daily  . digoxin  0.125 mg Oral QODAY  . heparin  5,000 Units Subcutaneous Q8H  . ivabradine  2.5 mg Oral BID WC  . multivitamin with minerals  1 tablet Oral Daily  . ranolazine  500 mg Oral BID  . rosuvastatin  20 mg Oral q1800  . sodium chloride flush  3 mL Intravenous Q12H  . ticagrelor  90 mg Oral BID     OBJECTIVE: Physical Exam: Vitals:   07/28/16 1319 07/28/16 1326 07/28/16 2013 07/29/16 0500  BP: (!) 76/42 (!) 96/46 117/68 (!) 93/58  Pulse:   73 74  Resp:   20 18  Temp:   97.7 F (36.5 C) 98.1 F (36.7 C)  TempSrc:   Oral Oral  SpO2:   99% 99%  Weight:    155 lb (70.3 kg)  Height:        Intake/Output Summary (Last 24 hours) at 07/29/16 1136 Last data filed at 07/29/16 0500  Gross per 24 hour  Intake              720 ml  Output             15 00 ml  Net             -780 ml    Telemetry is reviewed by myself: SR, very frequent PVCs, infrequent NSVT agreed sk  GEN- The patient is well appearing, alert and oriented x 3 today.   Head- normocephalic, atraumatic Eyes-  Sclera clear, conjunctiva pink Ears- hearing intact Oropharynx- clear Neck- supple, no JVP Lungs-  CTA b/l, normal work of breathing Heart- RRR, no significant murmurs, no rubs or gallops GI- soft, NT, ND Extremities- no clubbing, cyanosis, or edema Skin- no rash or lesion Psych- euthymic mood, full affect Neuro- no gross deficits appreciated  LABS: Basic Metabolic Panel:  Recent Labs  07/27/16 1930 07/28/16 0636 07/29/16 1022  NA 133* 136 136  K 3.4* 3.4* 4.1  CL 95* 98* 103  CO2 24 28 25   GLUCOSE 88 77 122*  BUN 20 18 12   CREATININE 2.25* 2.20* 2.04*  CALCIUM 9.5 9.6 9.3  MG 2.4 2.3  --   PHOS 3.1  --   --    CBC:  Recent Labs  07/27/16 1930  WBC 13.7*    HGB 15.2  HCT 45.7  MCV 82.5  PLT 255   Cardiac Enzymes:  Recent Labs  07/28/16 0106 07/28/16 0636 07/28/16 1212  TROPONINI 0.09* 0.10* 0.08*    Recent Labs  07/28/16 0325  TSH 2.148      ASSESSMENT AND PLAN:   Active Problems:   CAD (coronary artery disease), native coronary artery   Hypertension, essential   Chronic systolic HF (heart failure) (HCC)   Hypokalemia   VT (ventricular tachycardia) (HCC)   AKI (acute kidney injury) ([place])   Near syncope  The pt had presyncope assoc with tachypalpitations and accompanied by impending doom and profound shortness of breath.  Its abrupt termination is consistent with arrhythmia.   Given the plethora of ventricular ectopy of multiple morphologies, while it is likely that ectopy is secondary, trying to suppress it to gather what improvement might be gained in LV function and also, as he now is appropriately  considered for an ICD, is very important.  He is on ranolazine, but he is agreeable to adding back amiodarone in a trial basis  If he can tolerate will gradually uptitrate  The suppression of ventricular ectopy also makes available potential benefit from CRT in context of his LBBB and CHF  Needs K repletion   His renal dysfunction precludes meaningful benefit from dofetilide  Worry about the real value of digoxin  Defer to CHF   Tommye Standard, PA-C 07/29/2016 11:36 AM

## 2016-07-29 NOTE — Progress Notes (Signed)
Advanced Heart Failure Rounding Note   Subjective:    Yesterday EP consulted and he was started on ranexa. Continues to have PVCs and NSVT. Creatinine trending down. 2.2>2.0. Episode of hypotension.   Denies SOB.Frustrated and wants to get ICD.    Objective:   Weight Range:  Vital Signs:   Temp:  [97.7 F (36.5 C)-98.1 F (36.7 C)] 98.1 F (36.7 C) (04/06 0500) Pulse Rate:  [73-81] 74 (04/06 0500) Resp:  [18-20] 18 (04/06 0500) BP: (76-117)/(42-68) 93/58 (04/06 0500) SpO2:  [99 %-100 %] 99 % (04/06 0500) Weight:  [155 lb (70.3 kg)] 155 lb (70.3 kg) (04/06 0500) Last BM Date: 07/27/16  Weight change: Filed Weights   07/28/16 0109 07/29/16 0500  Weight: 152 lb 9.6 oz (69.2 kg) 155 lb (70.3 kg)    Intake/Output:   Intake/Output Summary (Last 24 hours) at 07/29/16 1125 Last data filed at 07/29/16 0500  Gross per 24 hour  Intake              720 ml  Output             1500 ml  Net             -780 ml     Physical Exam: General:  Well appearing. No resp difficulty. In bed. Wife at bedside.  HEENT: normal Neck: supple. JVP 5-6 . Carotids 2+ bilat; no bruits. No lymphadenopathy or thryomegaly appreciated. Cor: PMI laterally displaced. Regular rate & rhythm. No rubs, gallops or murmurs. Lungs: clear Abdomen: soft, nontender, nondistended. No hepatosplenomegaly. No bruits or masses. Good bowel sounds. Extremities: no cyanosis, clubbing, rash, edema Neuro: alert & orientedx3, cranial nerves grossly intact. moves all 4 extremities w/o difficulty. Affect pleasant  Telemetry: NSR PVC NSVT 70s. Personally reviewed.   Labs: Basic Metabolic Panel:  Recent Labs Lab 07/27/16 1930 07/28/16 0636 07/29/16 1022  NA 133* 136 136  K 3.4* 3.4* 4.1  CL 95* 98* 103  CO2 24 28 25   GLUCOSE 88 77 122*  BUN 20 18 12   CREATININE 2.25* 2.20* 2.04*  CALCIUM 9.5 9.6 9.3  MG 2.4 2.3  --   PHOS 3.1  --   --     Liver Function Tests: No results for input(s): AST, ALT,  ALKPHOS, BILITOT, PROT, ALBUMIN in the last 168 hours. No results for input(s): LIPASE, AMYLASE in the last 168 hours. No results for input(s): AMMONIA in the last 168 hours.  CBC:  Recent Labs Lab 07/27/16 1930  WBC 13.7*  HGB 15.2  HCT 45.7  MCV 82.5  PLT 255    Cardiac Enzymes:  Recent Labs Lab 07/28/16 0106 07/28/16 0636 07/28/16 1212  TROPONINI 0.09* 0.10* 0.08*    BNP: BNP (last 3 results)  Recent Labs  05/03/16 0435 05/19/16 1554 07/27/16 1930  BNP 1,034.8* 470.0* 321.6*    ProBNP (last 3 results) No results for input(s): PROBNP in the last 8760 hours.    Other results:  Imaging: Dg Chest Port 1 View  Result Date: 07/27/2016 CLINICAL DATA:  Palpitations, shortness of breath, and dizziness today, history hypertension, CHF, coronary artery disease post PTCA, ischemic cardiomyopathy EXAM: PORTABLE CHEST 1 VIEW COMPARISON:  Portable exam 1932 hours compared 04/30/2016 FINDINGS: Upper normal size of cardiac silhouette with note of coronary arterial stent. Mediastinal contours and pulmonary vascularity normal. Mild central peribronchial thickening. Lungs clear. No pleural effusion or pneumothorax. Bones unremarkable. IMPRESSION: Mild bronchitic changes without infiltrate. Electronically Signed   By: Crist Infante.D.  On: 07/27/2016 19:50      Medications:     Scheduled Medications: . aspirin  81 mg Oral Daily  . digoxin  0.125 mg Oral QODAY  . heparin  5,000 Units Subcutaneous Q8H  . isosorbide-hydrALAZINE  0.5 tablet Oral BID  . ivabradine  2.5 mg Oral BID WC  . multivitamin with minerals  1 tablet Oral Daily  . ranolazine  500 mg Oral BID  . rosuvastatin  20 mg Oral q1800  . sodium chloride flush  3 mL Intravenous Q12H  . ticagrelor  90 mg Oral BID     Infusions:   PRN Medications:  sodium chloride, acetaminophen, ALPRAZolam, diphenhydrAMINE, diphenhydrAMINE-zinc acetate, nitroGLYCERIN, ondansetron (ZOFRAN) IV, sodium chloride  flush   Assessment/Plan/Discussion:   1. Presyncope 2. NSVT-K 4.1 today. On 4/5 Mag 2.3. Started on ranexa. EP planning to start amio again.  3. Chronic Systolic HF- Having frequent pVCs and NSVT. Repeat ECHo penidng.  ICM.Off bb with low output. Stop bidil. BP soft.  Considered increasing ivabradine but heart in the 70s.  Volume status stable. Keep off diuretics.  4. CAD- S/P Stent RCA/LAD 04/2016. No CP. Continue ticagrelor + aspirin+ statin.  5. AKI- Creatinine trending down 2.2>2.0 Keep off spiro, dig, lasix for now. Stop bidil. Repeat BMET in am.  6. LBBB.    Length of Stay: 0   Amy Clegg NP-C  07/29/2016, 11:25 AM  Advanced Heart Failure Team Pager [phone] (M-F; 7a - 4p)  Please contact [facility] for night-coverage after hours (4p -7a ) and weekends on [email or web address]  Patient seen and examined with Darrick Grinder, NP. We discussed all aspects of the encounter. I agree with the assessment and plan as stated above.   PVCs seem to have settled down a bit. EP has seen and he is now on amio and Ranexa.   Given probable VT on admit, plan for ICD next week. D/W Dr. Caryl Comes.   Volume status stable.Renal function improving. Echo reviewed personally and EF 25%.   CAD is stable. No s/s of ischemia.   Glori Bickers, MD  4:26 PM

## 2016-07-29 NOTE — Progress Notes (Signed)
Progress Note    David [name]  FTD:[id number] DOB: [birth year]  DOA: 07/27/2016 PCP: Gilford Rile, MD    Brief Narrative:   Chief complaint: Follow-up chest pain and palpitations  Medical records reviewed and are as summarized below:  David [name] is an 51 y.o. male with medical history significant for coronary artery disease with stents, chronic systolic CHF with EF 06-23%, chronic kidney disease stage II, and hypertension presenting to the emergency department with intermittent chest pains and palpitations. Patient has a complex cardiac history with stents placed in 2009 and 2010, and admission in January of this year following a PEA arrest, resulting in additional stenting. He was evaluated for ICD placement but determined not to be a candidate per the patient's report. He was evaluated by his cardiologist on 07/19/2016 noted to still be having 16% PVCs, , and mexiletine was changed to amiodarone. Patient started taking amiodarone on 07/24/2016 and began to notice some mild general malaise and weakness the following day. He also began to note some worsening palpitations and the sensation as though his heart was stopping for a few seconds. He describes an episode just prior to his presentation in which she was walking and experienced acute onset of severe central chest pain and shortness of breath with a sensation that his heart had stopped. He remarked to his wife that he felt like he was dying, and at her urging, he came into the ED for evaluation. The episode resolved in less than a minute and he is felt fatigued since, but with no residual chest pain or significant dyspnea. There's been no recent fevers or chills and no lower extremity swelling.   ED Course: Upon arrival to the ED, patient was found to be afebrile, saturating well on room air, and with vital signs stable. EKG featured  sinus rhythm with first degree AV nodal block, PVCs, and left bundle branch block which was also seen  previously. Chemistry panels notable for sodium of 133, potassium 3.4, and serum creatinine of 2.25, up from an apparent baseline of 1.3. CBC is notable for a leukocytosis to 13,700. Troponin was within the normal limits and BNP was elevated to 322. Runs of nonsustained ventricular tachycardia were noted during the ED stay. Cardiology was consulted by the ED physician and a medical admission was advised. Patient will be observed on the telemetry unit for ongoing evaluation and management of chest pain and palpitations in the setting of nonsustained VT in a patient with EF 15-20% on recent echo.   Assessment/Plan:   Principal Problem: Palpitations associated with shortness of breath likely from NSVT/demand ischemia Patient denied chest pain to me. Mild troponin elevation noted. EKG with sinus rhythm, 1 degree AV block, PVC's, and known LBBB. Noted to have episodes of NSVT, and was recently evaluated for ICD placement. Cardiology/EP following. Replete potassium and keep level around 4. Continue aspirin, Brilinta and Crestor. Ranolazine added by cardiology. Plan now is to titrate up amiodarone, which has been resumed. Likely will get ICD placed.  Active problems:  Chronic systolic CHF  TTE (10/29/26) with EF 15-20%, diffuse HK, inferior and posterior AK, moderate MR, and moderate LAE. Continue digoxin. Diuretics currently on hold due to acute kidney injury but would have low threshold to resume. Continue to monitor fluid balance closely. Weight up about 2 pounds.   CAD  Pt has known CAD with stents. No complaints of chest pain. Continue ASA 81, Brilinta, Crestor, ivabridine.   Acute kidney injury superimposed on CKD stage  II  SCr is 2.25 on admission, up from apparent baseline of ~1.3. Diuretics on hold. Repeat creatinine in the morning.  Paroxysmal atrial fibrillation  Currently in sinus rhythm with frequent PVC's/NSVT noted on admission. CHADS-VASc at least 2 (CAD, CHF). Followed by cardiology and  no longer on anticoagulant. Monitoring on telemetry.  HIV screening The patient falls between the ages of 13-64 and should be screened for HIV, therefore HIV testing ordered: Nonreactive.  Hypokalemia Replaced potassium. Magnesium WNL. Recheck in the morning.   Family Communication/Anticipated D/C date and plan/Code Status   DVT prophylaxis: Heparin ordered. Code Status: Full Code.  Family Communication: No family currently at the bedside. Disposition Plan: Home when cleared by cardiology.   Medical Consultants:    Cardiology   Procedures:    None  Anti-Infectives:    None  Subjective:   Denies any reccurance of symptoms. Appetite good.  No nausea or vomiting.   Objective:    Vitals:   07/28/16 1319 07/28/16 1326 07/28/16 2013 07/29/16 0500  BP: (!) 76/42 (!) 96/46 117/68 (!) 93/58  Pulse:   73 74  Resp:   20 18  Temp:   97.7 F (36.5 C) 98.1 F (36.7 C)  TempSrc:   Oral Oral  SpO2:   99% 99%  Weight:    70.3 kg (155 lb)  Height:        Intake/Output Summary (Last 24 hours) at 07/29/16 0837 Last data filed at 07/29/16 0500  Gross per 24 hour  Intake              720 ml  Output             1500 ml  Net             -780 ml   Filed Weights   07/28/16 0109 07/29/16 0500  Weight: 69.2 kg (152 lb 9.6 oz) 70.3 kg (155 lb)    Exam: General exam: Appears calm and comfortable.  Respiratory system: Clear to auscultation. Respiratory effort normal. Cardiovascular system: S1 & S2 heard, RRR. No JVD,  rubs, gallops or clicks. No murmurs. Gastrointestinal system: Abdomen is nondistended, soft and nontender. No organomegaly or masses felt. Normal bowel sounds heard. Central nervous system: Alert and oriented. No focal neurological deficits. Extremities: No clubbing,  or cyanosis. No edema. Skin: No rashes, lesions or ulcers. Psychiatry: Judgement and insight appear normal. Mood & affect appropriate.   Data Reviewed:   I have personally reviewed following  labs and imaging studies:  Labs: Basic Metabolic Panel:  Recent Labs Lab 07/27/16 1930 07/28/16 0636  NA 133* 136  K 3.4* 3.4*  CL 95* 98*  CO2 24 28  GLUCOSE 88 77  BUN 20 18  CREATININE 2.25* 2.20*  CALCIUM 9.5 9.6  MG 2.4 2.3  PHOS 3.1  --    GFR Estimated Creatinine Clearance: 34.2 mL/min (A) (by C-G formula based on SCr of 2.2 mg/dL (H)). Liver Function Tests: No results for input(s): AST, ALT, ALKPHOS, BILITOT, PROT, ALBUMIN in the last 168 hours. No results for input(s): LIPASE, AMYLASE in the last 168 hours. No results for input(s): AMMONIA in the last 168 hours. Coagulation profile No results for input(s): INR, PROTIME in the last 168 hours.  CBC:  Recent Labs Lab 07/27/16 1930  WBC 13.7*  HGB 15.2  HCT 45.7  MCV 82.5  PLT 255   Cardiac Enzymes:  Recent Labs Lab 07/28/16 0106 07/28/16 0636 07/28/16 1212  TROPONINI 0.09* 0.10* 0.08*  Thyroid function studies:  Recent Labs  07/28/16 0325  TSH 2.148   Microbiology No results found for this or any previous visit (from the past 240 hour(s)).  Radiology: Dg Chest Port 1 View  Result Date: 07/27/2016 CLINICAL DATA:  Palpitations, shortness of breath, and dizziness today, history hypertension, CHF, coronary artery disease post PTCA, ischemic cardiomyopathy EXAM: PORTABLE CHEST 1 VIEW COMPARISON:  Portable exam 1932 hours compared 04/30/2016 FINDINGS: Upper normal size of cardiac silhouette with note of coronary arterial stent. Mediastinal contours and pulmonary vascularity normal. Mild central peribronchial thickening. Lungs clear. No pleural effusion or pneumothorax. Bones unremarkable. IMPRESSION: Mild bronchitic changes without infiltrate. Electronically Signed   By: Lavonia Dana M.D.   On: 07/27/2016 19:50    Medications:   . aspirin  81 mg Oral Daily  . digoxin  0.125 mg Oral QODAY  . heparin  5,000 Units Subcutaneous Q8H  . isosorbide-hydrALAZINE  0.5 tablet Oral BID  . ivabradine  2.5 mg  Oral BID WC  . multivitamin with minerals  1 tablet Oral Daily  . ranolazine  500 mg Oral BID  . rosuvastatin  20 mg Oral q1800  . sodium chloride flush  3 mL Intravenous Q12H  . ticagrelor  90 mg Oral BID   Continuous Infusions:  Medical decision making is of high complexity and this patient is at high risk of deterioration, therefore this is a level 3 visit.  (> 4 problem points, 2 data points, high risk)    LOS: 0 days   David [name]  [facility] Pager [phone]. If unable to reach me by pager, please call my cell phone at [phone].  *Please refer to [email or web address], password [email or web address] to get updated schedule on who will round on this patient, as hospitalists switch teams weekly. If 7PM-7AM, please contact night-coverage at [email or web address], password [email or web address] for any overnight needs.  07/29/2016, 8:37 AM

## 2016-07-29 NOTE — Progress Notes (Signed)
Patient tolerate amiodarone dose this morning, plan with Dr. Caryl Comes is we will start 200mg  q8 hrs if he tolerates this tonight, increase to 400mg  TID Saturday.  Tommye Standard, PA-C

## 2016-07-30 LAB — DIGOXIN LEVEL: Digoxin Level: 0.7 ng/mL — ABNORMAL LOW (ref 0.8–2.0)

## 2016-07-30 MED ORDER — SPIRONOLACTONE 25 MG PO TABS
12.5000 mg | ORAL_TABLET | Freq: Every day | ORAL | Status: DC
  Administered 2016-07-30 – 2016-07-31 (×2): 12.5 mg via ORAL
  Filled 2016-07-30 (×2): qty 1

## 2016-07-30 NOTE — Progress Notes (Addendum)
Progress Note    David [name]  HUT:[id number] DOB: [birth year]  DOA: 07/27/2016 PCP: Gilford Rile, MD    Brief Narrative:   Chief complaint: Follow-up chest pain and palpitations  Medical records reviewed and are as summarized below:  David [name] is an 51 y.o. male with medical history significant for coronary artery disease with stents, chronic systolic CHF with EF 65-68%, chronic kidney disease stage II, and hypertension presenting to the emergency department with intermittent chest pains and palpitations. Patient has a complex cardiac history with stents placed in 2009 and 2010, and admission in January of this year following a PEA arrest, resulting in additional stenting. He was evaluated for ICD placement but determined not to be a candidate per the patient's report. He was evaluated by his cardiologist on 07/19/2016 noted to still be having 16% PVCs, and mexiletine was changed to amiodarone. Patient started taking amiodarone on 07/24/2016 and began to notice some mild general malaise and weakness the following day. He also began to note some worsening palpitations and the sensation as though his heart was stopping for a few seconds. He describes an episode just prior to his presentation in which he was walking and experienced acute onset of severe central chest pain and shortness of breath with a sensation that his heart had stopped. He remarked to his wife that he felt like he was dying, and at her urging, he came into the ED for evaluation. The episode resolved in less than a minute and he is felt fatigued since, but with no residual chest pain or significant dyspnea.  EKG featured sinus rhythm with first degree AV nodal block, PVCs, and left bundle branch block which was also seen previously. Chemistry panels notable for serum creatinine of 2.25, up from an apparent baseline of 1.3.  Troponin was within the normal limits and BNP was elevated to 322. Runs of nonsustained ventricular  tachycardia were noted during the ED stay. Cardiology was consulted by the ED physician.  Assessment/Plan:   Principal Problem: Palpitations associated with shortness of breath likely from NSVT/demand ischemia Mild troponin elevation noted. EKG with sinus rhythm, 1 degree AV block, PVC's, and known LBBB. Noted to have episodes of NSVT, and was recently evaluated for ICD placement. Cardiology/EP following. Replete potassium and keep level around 4. Continue aspirin, Brilinta and Crestor. Ranolazine added by cardiology. Plan now is to titrate up amiodarone, which has been resumed. ICD placement scheduled for 08/01/16.  Active problems:  Chronic systolic CHF  TTE (04/26/73) with EF 15-20%, diffuse HK, inferior and posterior AK, moderate MR, and moderate LAE; Repeat Echo shows EF 25% with ongoing diffuse HK. Continue digoxin. Diuretics currently on hold due to acute kidney injury but would have low threshold to resume. Continue to monitor fluid balance closely. Weight up about 2 pounds. No dyspnea.  CAD  Pt has known CAD with stents. No complaints of chest pain. Continue ASA 81, Brilinta, Crestor, ivabridine.   Acute kidney injury superimposed on CKD stage II  SCr is 2.25 on admission, up from apparent baseline of ~1.3. Diuretics on hold. Creatinine remains elevated over usual baseline values. Continue to monitor.  Paroxysmal atrial fibrillation  Currently in sinus rhythm with frequent PVC's/NSVT noted on admission. CHADS-VASc at least 2 (CAD, CHF). Followed by cardiology and no longer on anticoagulant. Monitoring on telemetry.  HIV screening The patient falls between the ages of 13-64 and should be screened for HIV, therefore HIV testing ordered: Nonreactive.  Hypokalemia Potassium WNL this morning.  Family Communication/Anticipated D/C date and plan/Code Status   DVT prophylaxis: Heparin ordered. Code Status: Full Code.  Family Communication: No family currently at the  bedside. Disposition Plan: Home when cleared by cardiology, likely not until 08/02/16 (ICD to be placed 08/01/16).   Medical Consultants:    Cardiology   Procedures:   2 D Echo 07/29/16  Study Conclusions  - Left ventricle: The cavity size was severely dilated. Wall   thickness was normal. The estimated ejection fraction was 25%.   Diffuse hypokinesis. Left ventricular diastolic function   parameters were normal. - Aortic valve: There was mild regurgitation. - Mitral valve: There was mild regurgitation. - Left atrium: The atrium was moderately dilated. - Atrial septum: No defect or patent foramen ovale was identified.  Anti-Infectives:    None  Subjective:   Denies any reccurance of symptoms, only mildly short of breath with exertion. Appetite remains good.  No nausea or vomiting.   Objective:    Vitals:   07/29/16 0500 07/29/16 1449 07/29/16 2045 07/30/16 0655  BP: (!) 93/58 100/61 (!) 100/50 (!) 96/58  Pulse: 74 71 71 70  Resp: 18 18 18 18   Temp: 98.1 F (36.7 C) 97.6 F (36.4 C) 97.9 F (36.6 C) 97.9 F (36.6 C)  TempSrc: Oral Oral Oral Oral  SpO2: 99% 100% 99% 100%  Weight: 70.3 kg (155 lb)   70.6 kg (155 lb 11.2 oz)  Height:        Intake/Output Summary (Last 24 hours) at 07/30/16 0841 Last data filed at 07/30/16 0520  Gross per 24 hour  Intake                0 ml  Output             1150 ml  Net            -1150 ml   Filed Weights   07/28/16 0109 07/29/16 0500 07/30/16 0655  Weight: 69.2 kg (152 lb 9.6 oz) 70.3 kg (155 lb) 70.6 kg (155 lb 11.2 oz)    Exam: General exam: Appears calm and comfortable.  Respiratory system: Clear to auscultation. Respiratory effort normal. Cardiovascular system: S1 & S2 heard, RRR. No JVD,  rubs, gallops or clicks. No murmurs. Gastrointestinal system: Abdomen is nondistended, soft and nontender. No organomegaly or masses felt. Normal bowel sounds heard. Central nervous system: Alert and oriented. No focal  neurological deficits. Extremities: No clubbing,  or cyanosis. No edema. Skin: No rashes, lesions or ulcers. Psychiatry: Judgement and insight appear normal. Mood & affect appropriate.   Data Reviewed:   I have personally reviewed following labs and imaging studies:  Labs: Basic Metabolic Panel:  Recent Labs Lab 07/27/16 1930 07/28/16 0636 07/29/16 1022  NA 133* 136 136  K 3.4* 3.4* 4.1  CL 95* 98* 103  CO2 24 28 25   GLUCOSE 88 77 122*  BUN 20 18 12   CREATININE 2.25* 2.20* 2.04*  CALCIUM 9.5 9.6 9.3  MG 2.4 2.3  --   PHOS 3.1  --   --    GFR Estimated Creatinine Clearance: 36.8 mL/min (A) (by C-G formula based on SCr of 2.04 mg/dL (H)).  CBC:  Recent Labs Lab 07/27/16 1930  WBC 13.7*  HGB 15.2  HCT 45.7  MCV 82.5  PLT 255   Cardiac Enzymes:  Recent Labs Lab 07/28/16 0106 07/28/16 0636 07/28/16 1212  TROPONINI 0.09* 0.10* 0.08*   Thyroid function studies:  Recent Labs  07/28/16 0325  TSH 2.148  Radiology: No results found.  Medications:   . amiodarone  200 mg Oral TID  . aspirin  81 mg Oral Daily  . heparin  5,000 Units Subcutaneous Q8H  . ivabradine  2.5 mg Oral BID WC  . multivitamin with minerals  1 tablet Oral Daily  . ranolazine  500 mg Oral BID  . rosuvastatin  20 mg Oral q1800  . sodium chloride flush  3 mL Intravenous Q12H  . ticagrelor  90 mg Oral BID   Continuous Infusions:  Medical decision making is of high complexity and this patient is at high risk of deterioration, therefore this is a level 3 visit.  (> 4 problem points, 0 data points, mod risk)    LOS: 1 day   Tecia Cinnamon  [facility] Pager [phone]. If unable to reach me by pager, please call my cell phone at [phone].  *Please refer to [email or web address], password [email or web address] to get updated schedule on who will round on this patient, as hospitalists switch teams weekly. If 7PM-7AM, please contact night-coverage at [email or web address], password [email or web address] for any overnight  needs.  07/30/2016, 8:41 AM

## 2016-07-30 NOTE — Progress Notes (Signed)
Advanced Heart Failure Rounding Note   Subjective:    On amio and ranexa. Feels good. No orthopnea or PND. Weight stable Creatinine improving.  PVC burden markedly decreased. Scheduled for CRT-D on Monday.    Objective:   Weight Range:  Vital Signs:   Temp:  [97.6 F (36.4 C)-97.9 F (36.6 C)] 97.9 F (36.6 C) (04/07 0655) Pulse Rate:  [70-71] 70 (04/07 0655) Resp:  [18] 18 (04/07 0655) BP: (96-100)/(50-61) 96/58 (04/07 0655) SpO2:  [99 %-100 %] 100 % (04/07 0655) Weight:  [70.6 kg (155 lb 11.2 oz)] 70.6 kg (155 lb 11.2 oz) (04/07 0655) Last BM Date: 07/29/16  Weight change: Filed Weights   07/28/16 0109 07/29/16 0500 07/30/16 0655  Weight: 69.2 kg (152 lb 9.6 oz) 70.3 kg (155 lb) 70.6 kg (155 lb 11.2 oz)    Intake/Output:   Intake/Output Summary (Last 24 hours) at 07/30/16 0956 Last data filed at 07/30/16 0520  Gross per 24 hour  Intake                0 ml  Output             1150 ml  Net            -1150 ml     Physical Exam: General:  Lying in bed NAD HEENT: normal anicteric Neck: supple. JVP 6-7  Carotids 2+ bilat; no bruits. No lymphadenopathy or thryomegaly appreciated. Cor: PMI laterally displaced. RRR occasional ectopy. No s3 Lungs: clear no wheeze Abdomen: soft, nontenderr, nondistended. Good bowel sounds Extremities: no cyanosis, clubbing, rash. No edema. Wamr Neuro: alert & orientedx3, cranial nerves grossly intact. moves all 4 extremities w/o difficulty. Affect pleasant  Telemetry: NSR. 70s. Averaging 0-4 PVCs per minute. No further NSVT. Personally reviewed   Labs: Basic Metabolic Panel:  Recent Labs Lab 07/27/16 1930 07/28/16 0636 07/29/16 1022  NA 133* 136 136  K 3.4* 3.4* 4.1  CL 95* 98* 103  CO2 24 28 25   GLUCOSE 88 77 122*  BUN 20 18 12   CREATININE 2.25* 2.20* 2.04*  CALCIUM 9.5 9.6 9.3  MG 2.4 2.3  --   PHOS 3.1  --   --     Liver Function Tests: No results for input(s): AST, ALT, ALKPHOS, BILITOT, PROT, ALBUMIN in  the last 168 hours. No results for input(s): LIPASE, AMYLASE in the last 168 hours. No results for input(s): AMMONIA in the last 168 hours.  CBC:  Recent Labs Lab 07/27/16 1930  WBC 13.7*  HGB 15.2  HCT 45.7  MCV 82.5  PLT 255    Cardiac Enzymes:  Recent Labs Lab 07/28/16 0106 07/28/16 0636 07/28/16 1212  TROPONINI 0.09* 0.10* 0.08*    BNP: BNP (last 3 results)  Recent Labs  05/03/16 0435 05/19/16 1554 07/27/16 1930  BNP 1,034.8* 470.0* 321.6*    ProBNP (last 3 results) No results for input(s): PROBNP in the last 8760 hours.    Other results:  Imaging: No results found.   Medications:     Scheduled Medications: . amiodarone  200 mg Oral TID  . aspirin  81 mg Oral Daily  . heparin  5,000 Units Subcutaneous Q8H  . ivabradine  2.5 mg Oral BID WC  . multivitamin with minerals  1 tablet Oral Daily  . ranolazine  500 mg Oral BID  . rosuvastatin  20 mg Oral q1800  . sodium chloride flush  3 mL Intravenous Q12H  . ticagrelor  90 mg Oral BID  Infusions:   PRN Medications: sodium chloride, acetaminophen, ALPRAZolam, diphenhydrAMINE, diphenhydrAMINE-zinc acetate, nitroGLYCERIN, ondansetron (ZOFRAN) IV, sodium chloride flush   Assessment/Plan/Discussion:   1. Presyncope/VT 2. NSVT/frequent PVCs -K 4.1 today. On 4/5 Mag 2.3. PVC burden much improved on amio and Ranexa. Will continue 3. Chronic Systolic HF - Echo this admit EF 25%. 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 Digoxin stopped yesterday by EP - Off Bidil due to low B. Wil try to retart 0.5 tab per day prior to d/c Arlyce Harman on hold with AKI. Will resume - No ARB with low BP and AKI - Continue ivabradine   -Volume status stable. Keep off diuretics for now.  4. CAD - S/P Stent RCA/LAD 04/2016. No CP. Continue ticagrelor + aspirin+ statin.  5. AKI likely due to ATN with VT - Creatinine down 2.2->2.0 Baseline 1.7-1.8.Follow BMET 6. LBBB.  - For CRT-D  Monday   Length of Stay: 1 Sian Rockers, Quillian Quince, MD  9:59 AM Advanced Heart Failure Team Pager [phone] (M-F; 7a - 4p)  Please contact [facility] for night-coverage after hours (4p -7a ) and weekends on [email or web address]

## 2016-07-31 LAB — MAGNESIUM: MAGNESIUM: 2.3 mg/dL (ref 1.7–2.4)

## 2016-07-31 LAB — BASIC METABOLIC PANEL
Anion gap: 9 (ref 5–15)
BUN: 14 mg/dL (ref 6–20)
CALCIUM: 9.8 mg/dL (ref 8.9–10.3)
CO2: 25 mmol/L (ref 22–32)
CREATININE: 2.13 mg/dL — AB (ref 0.61–1.24)
Chloride: 102 mmol/L (ref 101–111)
GFR, EST AFRICAN AMERICAN: 39 mL/min — AB (ref >60)
GFR, EST NON AFRICAN AMERICAN: 34 mL/min — AB (ref >60)
Glucose, Bld: 124 mg/dL — ABNORMAL HIGH (ref 65–99)
Potassium: 4.4 mmol/L (ref 3.5–5.1)
SODIUM: 136 mmol/L (ref 135–145)

## 2016-07-31 LAB — CBC
HCT: 44.4 % (ref 39.0–52.0)
HEMOGLOBIN: 14.7 g/dL (ref 13.0–17.0)
MCH: 27.5 pg (ref 26.0–34.0)
MCHC: 33.1 g/dL (ref 30.0–36.0)
MCV: 83 fL (ref 78.0–100.0)
PLATELETS: 230 10*3/uL (ref 150–400)
RBC: 5.35 MIL/uL (ref 4.22–5.81)
RDW: 14.4 % (ref 11.5–15.5)
WBC: 14.6 10*3/uL — ABNORMAL HIGH (ref 4.0–10.5)

## 2016-07-31 LAB — MRSA PCR SCREENING: MRSA BY PCR: NEGATIVE

## 2016-07-31 MED ORDER — CEFAZOLIN SODIUM-DEXTROSE 2-4 GM/100ML-% IV SOLN
2.0000 g | INTRAVENOUS | Status: AC
  Administered 2016-08-01: 2 g via INTRAVENOUS
  Filled 2016-07-31: qty 100

## 2016-07-31 MED ORDER — SODIUM CHLORIDE 0.9% FLUSH: 3.0000 mL | INTRAVENOUS | Status: DC | PRN

## 2016-07-31 MED ORDER — CHLORHEXIDINE GLUCONATE 4 % EX LIQD
60.0000 mL | Freq: Once | CUTANEOUS | Status: AC
  Administered 2016-07-31: 4 via TOPICAL
  Filled 2016-07-31: qty 60

## 2016-07-31 MED ORDER — SODIUM CHLORIDE 0.9% FLUSH
3.0000 mL | Freq: Two times a day (BID) | INTRAVENOUS | Status: DC
  Administered 2016-07-31 (×2): 3 mL via INTRAVENOUS

## 2016-07-31 MED ORDER — CHLORHEXIDINE GLUCONATE 4 % EX LIQD: 60.0000 mL | Freq: Once | CUTANEOUS | Status: DC

## 2016-07-31 MED ORDER — SODIUM CHLORIDE 0.9 % IR SOLN
80.0000 mg | Status: DC
  Filled 2016-07-31: qty 2

## 2016-07-31 MED ORDER — CHLORHEXIDINE GLUCONATE 4 % EX LIQD
60.0000 mL | Freq: Once | CUTANEOUS | Status: AC
  Administered 2016-08-01: 4 via TOPICAL
  Filled 2016-07-31: qty 60

## 2016-07-31 MED ORDER — CEFAZOLIN SODIUM-DEXTROSE 2-4 GM/100ML-% IV SOLN
2.0000 g | INTRAVENOUS | Status: DC
  Filled 2016-07-31: qty 100

## 2016-07-31 MED ORDER — SODIUM CHLORIDE 0.9 % IV SOLN
250.0000 mL | INTRAVENOUS | Status: DC
  Administered 2016-08-01: 250 mL via INTRAVENOUS

## 2016-07-31 MED ORDER — SODIUM CHLORIDE 0.9 % IV SOLN
INTRAVENOUS | Status: DC
  Administered 2016-08-01: 06:00:00 via INTRAVENOUS

## 2016-07-31 MED ORDER — SODIUM CHLORIDE 0.9 % IR SOLN
80.0000 mg | Status: AC
  Administered 2016-08-01: 80 mg
  Filled 2016-07-31: qty 2

## 2016-07-31 NOTE — Progress Notes (Signed)
Progress Note  Patient Name: David [name] Date of Encounter: 07/31/2016  Primary Cardiologist: Dr. Dillard Essex  Subjective   No chest pain or sob. Feels better. Minimal palpitations.  Inpatient Medications    Scheduled Meds: . amiodarone  200 mg Oral TID  . aspirin  81 mg Oral Daily  .  ceFAZolin (ANCEF) IV  2 g Intravenous On Call  . chlorhexidine  60 mL Topical Once  . chlorhexidine  60 mL Topical Once  . gentamicin irrigation  80 mg Irrigation On Call  . heparin  5,000 Units Subcutaneous Q8H  . ivabradine  2.5 mg Oral BID WC  . multivitamin with minerals  1 tablet Oral Daily  . ranolazine  500 mg Oral BID  . rosuvastatin  20 mg Oral q1800  . sodium chloride flush  3 mL Intravenous Q12H  . sodium chloride flush  3 mL Intravenous Q12H  . spironolactone  12.5 mg Oral Daily  . ticagrelor  90 mg Oral BID   Continuous Infusions: . sodium chloride    . sodium chloride     PRN Meds: sodium chloride, acetaminophen, ALPRAZolam, diphenhydrAMINE, diphenhydrAMINE-zinc acetate, nitroGLYCERIN, ondansetron (ZOFRAN) IV, sodium chloride flush, sodium chloride flush   Vital Signs    Vitals:   07/30/16 0655 07/30/16 1319 07/30/16 2000 07/31/16 0338  BP: (!) 96/58 (!) 97/58 (!) 101/54 103/66  Pulse: 70 74 67 70  Resp: 18 19 18 18   Temp: 97.9 F (36.6 C) 98 F (36.7 C) 98.1 F (36.7 C) 97.6 F (36.4 C)  TempSrc: Oral Oral Oral Oral  SpO2: 100% 97% 98% 100%  Weight: 155 lb 11.2 oz (70.6 kg)   156 lb 12 oz (71.1 kg)  Height:       No intake or output data in the 24 hours ending 07/31/16 0841 Filed Weights   07/29/16 0500 07/30/16 0655 07/31/16 0338  Weight: 155 lb (70.3 kg) 155 lb 11.2 oz (70.6 kg) 156 lb 12 oz (71.1 kg)    Telemetry    NSR with rare PVC's - Personally Reviewed  ECG    None today - Personally Reviewed  Physical Exam   GEN: No acute distress.   Neck: 6 cm JVD Cardiac: RRR, no murmurs, rubs, or gallops.  Respiratory: Clear to auscultation  bilaterally. GI: Soft, nontender, non-distended  MS: No edema; No deformity. Neuro:  Nonfocal  Psych: Normal affect   Labs    Chemistry Recent Labs Lab 07/27/16 1930 07/28/16 0636 07/29/16 1022  NA 133* 136 136  K 3.4* 3.4* 4.1  CL 95* 98* 103  CO2 24 28 25   GLUCOSE 88 77 122*  BUN 20 18 12   CREATININE 2.25* 2.20* 2.04*  CALCIUM 9.5 9.6 9.3  GFRNONAA 32* 33* 36*  GFRAA 37* 38* 41*  ANIONGAP 14 10 8      Hematology Recent Labs Lab 07/27/16 1930  WBC 13.7*  RBC 5.54  HGB 15.2  HCT 45.7  MCV 82.5  MCH 27.4  MCHC 33.3  RDW 14.2  PLT 255    Cardiac Enzymes Recent Labs Lab 07/28/16 0106 07/28/16 0636 07/28/16 1212  TROPONINI 0.09* 0.10* 0.08*    Recent Labs Lab 07/27/16 1932  TROPIPOC 0.07     BNP Recent Labs Lab 07/27/16 1930  BNP 321.6*     DDimer No results for input(s): DDIMER in the last 168 hours.   Radiology    No results found.  Cardiac Studies   none  Patient Profile     51 y.o. male  admitted with PEA arrest, complex ventricular ectopy and negative EP study with advanced CHF and dense ventricular ectopy, now nicely suppressed with amiodarone  Assessment & Plan    1. NVSVT/PVC's - his dense ventricular ectopy has resolved. Continue amiodarone. 2. Chronic systolic heart failure -improved with diuretic therapy. 3. CAD 4. LBBB Plan: biV ICD tomorrow. Continue current meds.   Signed, Cristopher Peru, MD  07/31/2016, 8:41 AM  Patient ID: David [name], male   DOB: 02/18/1966, 51 y.o.   MRN: [id number]

## 2016-07-31 NOTE — Progress Notes (Signed)
PROGRESS NOTE    David [name]  DQQ:[id number] DOB: 09/11/1965 DOA: 07/27/2016 PCP: Gilford Rile, MD     Brief Narrative:  David [name] is an 51 y.o. male with medical history significant for coronary artery disease with stents, chronic systolic CHF with EF 19-41%, chronic kidney disease stage II, and hypertension presenting to the emergency department with intermittent chest pains and palpitations. Patient has a complex cardiac history with stents placed in 2009 and 2010,and admission in January of this year following a PEA arrest, resulting in additional stenting. He was evaluated for ICD placement but determined not to be a candidate per the patient's report. He was evaluated by his cardiologist on 07/19/2016 noted to still be having 16% PVCs, and mexiletine was changed to amiodarone. Patient started taking amiodarone on 07/24/2016 and began to notice some mild general malaise and weakness the following day. He also began to note some worsening palpitations and the sensation as though his heart was stopping for a few seconds. He describes an episode just prior to his presentation in which he was walking and experienced acute onset of severe central chest pain and shortness of breath with a sensation that his heart had stopped. He remarked to his wife that he felt like he was dying, and at her urging, he came into the ED for evaluation. The episode resolved in less than a minute and he is felt fatigued since, but with no residual chest pain or significant dyspnea.  EKG featured sinus rhythm with first degree AV nodal block, PVCs, and left bundle branch block which was also seen previously. Chemistry panels notable for serum creatinine of 2.25, up from an apparent baseline of 1.3.  Troponin was within the normal limits and BNP was elevated to 322. Runs of nonsustained ventricular tachycardia were noted during the ED stay. Cardiology was consulted by the ED physician.   Assessment & Plan:   Active  Problems:   CAD (coronary artery disease), native coronary artery   Hypertension, essential   Chronic systolic HF (heart failure) (HCC)   Hypokalemia   Chest pain   VT (ventricular tachycardia) (HCC)   AKI (acute kidney injury) ([place])   Near syncope  NSVT Mild troponin elevation noted. EKG with sinus rhythm, 1 degree AV block, PVC's, and known LBBB. Noted to have episodes of NSVT, and was recently evaluated for ICD placement. Cardiology/EP following. Replete potassium and keep level around 4. Continue aspirin, Brilinta and Crestor. Ranolazine added by cardiology. Plan now is to titrate up amiodarone, which has been resumed. ICD placement scheduled for 08/01/16.  Chronic systolic CHF  TTE (10/27/06) with EF 15-20%, diffuse HK, inferior and posterior AK, moderate MR, and moderate LAE; Repeat Echo shows EF 25% with ongoing diffuse HK. Diuretics currently on hold due to acute kidney injury. Continue to monitor fluid balance closely. Volume status stable currently   CAD  Pt has known CAD with stents. No complaints of chest pain. Continue ASA 81, Brilinta, Crestor, ivabridine.   Acute kidney injury superimposed on CKD stage II  SCr is 2.25 on admission, up from apparent baseline of ~1.3. Diuretics on hold. Creatinine remains elevated over usual baseline values. Continue to monitor.   Paroxysmal atrial fibrillation  Currently in sinus rhythm with frequent PVC's/NSVT noted on admission. CHADS-VASc at least 2 (CAD, CHF). Followed by cardiology and no longer on anticoagulant. Monitoring on telemetry.   DVT prophylaxis: heparin subq  Code Status: full Family Communication: no family at bedside Disposition Plan: pending procedure 4/9   Consultants:  Cardiology  Heart failure  EP   Procedures:   None   Antimicrobials:   None    Subjective: No complaints today. Denies any chest pain, shortness of breath, cough, nausea or vomiting. Has good appetite.  Objective: Vitals:    07/30/16 0655 07/30/16 1319 07/30/16 2000 07/31/16 0338  BP: (!) 96/58 (!) 97/58 (!) 101/54 103/66  Pulse: 70 74 67 70  Resp: 18 19 18 18   Temp: 97.9 F (36.6 C) 98 F (36.7 C) 98.1 F (36.7 C) 97.6 F (36.4 C)  TempSrc: Oral Oral Oral Oral  SpO2: 100% 97% 98% 100%  Weight: 70.6 kg (155 lb 11.2 oz)   71.1 kg (156 lb 12 oz)  Height:        Intake/Output Summary (Last 24 hours) at 07/31/16 1308 Last data filed at 07/31/16 0800  Gross per 24 hour  Intake              240 ml  Output                0 ml  Net              240 ml   Filed Weights   07/29/16 0500 07/30/16 0655 07/31/16 0338  Weight: 70.3 kg (155 lb) 70.6 kg (155 lb 11.2 oz) 71.1 kg (156 lb 12 oz)    Examination:  General exam: Appears calm and comfortable  Respiratory system: Clear to auscultation. Respiratory effort normal. Cardiovascular system: S1 & S2 heard, RRR. No JVD, murmurs, rubs, gallops or clicks. No pedal edema. Gastrointestinal system: Abdomen is nondistended, soft and nontender. No organomegaly or masses felt. Normal bowel sounds heard. Central nervous system: Alert and oriented. No focal neurological deficits. Extremities: Symmetric 5 x 5 power. Skin: No rashes, lesions or ulcers Psychiatry: Judgement and insight appear normal. Mood & affect appropriate.   Data Reviewed: I have personally reviewed following labs and imaging studies  CBC:  Recent Labs Lab 07/27/16 1930 07/31/16 0837  WBC 13.7* 14.6*  HGB 15.2 14.7  HCT 45.7 44.4  MCV 82.5 83.0  PLT 255 323   Basic Metabolic Panel:  Recent Labs Lab 07/27/16 1930 07/28/16 0636 07/29/16 1022 07/31/16 0837  NA 133* 136 136 136  K 3.4* 3.4* 4.1 4.4  CL 95* 98* 103 102  CO2 24 28 25 25   GLUCOSE 88 77 122* 124*  BUN 20 18 12 14   CREATININE 2.25* 2.20* 2.04* 2.13*  CALCIUM 9.5 9.6 9.3 9.8  MG 2.4 2.3  --  2.3  PHOS 3.1  --   --   --    GFR: Estimated Creatinine Clearance: 35.3 mL/min (A) (by C-G formula based on SCr of 2.13 mg/dL  (H)). Liver Function Tests: No results for input(s): AST, ALT, ALKPHOS, BILITOT, PROT, ALBUMIN in the last 168 hours. No results for input(s): LIPASE, AMYLASE in the last 168 hours. No results for input(s): AMMONIA in the last 168 hours. Coagulation Profile: No results for input(s): INR, PROTIME in the last 168 hours. Cardiac Enzymes:  Recent Labs Lab 07/28/16 0106 07/28/16 0636 07/28/16 1212  TROPONINI 0.09* 0.10* 0.08*   BNP (last 3 results) No results for input(s): PROBNP in the last 8760 hours. HbA1C: No results for input(s): HGBA1C in the last 72 hours. CBG: No results for input(s): GLUCAP in the last 168 hours. Lipid Profile: No results for input(s): CHOL, HDL, LDLCALC, TRIG, CHOLHDL, LDLDIRECT in the last 72 hours. Thyroid Function Tests: No results for input(s): TSH, T4TOTAL, FREET4, T3FREE,  THYROIDAB in the last 72 hours. Anemia Panel: No results for input(s): VITAMINB12, FOLATE, FERRITIN, TIBC, IRON, RETICCTPCT in the last 72 hours. Sepsis Labs: No results for input(s): PROCALCITON, LATICACIDVEN in the last 168 hours.  Recent Results (from the past 240 hour(s))  MRSA PCR Screening     Status: None   Collection Time: 07/31/16 12:50 AM  Result Value Ref Range Status   MRSA by PCR NEGATIVE NEGATIVE Final    Comment:        The GeneXpert MRSA Assay (FDA approved for NASAL specimens only), is one component of a comprehensive MRSA colonization surveillance program. It is not intended to diagnose MRSA infection nor to guide or monitor treatment for MRSA infections.        Radiology Studies: No results found.    Scheduled Meds: . amiodarone  200 mg Oral TID  . aspirin  81 mg Oral Daily  .  ceFAZolin (ANCEF) IV  2 g Intravenous On Call  . chlorhexidine  60 mL Topical Once  . chlorhexidine  60 mL Topical Once  . gentamicin irrigation  80 mg Irrigation On Call  . heparin  5,000 Units Subcutaneous Q8H  . ivabradine  2.5 mg Oral BID WC  . multivitamin  with minerals  1 tablet Oral Daily  . ranolazine  500 mg Oral BID  . rosuvastatin  20 mg Oral q1800  . sodium chloride flush  3 mL Intravenous Q12H  . sodium chloride flush  3 mL Intravenous Q12H  . spironolactone  12.5 mg Oral Daily  . ticagrelor  90 mg Oral BID   Continuous Infusions: . sodium chloride    . sodium chloride       LOS: 2 days    Time spent: 40 minutes   Dessa Phi, DO [facility] [email or web address] Password [email or web address] 07/31/2016, 1:08 PM

## 2016-08-01 ENCOUNTER — Encounter (HOSPITAL_COMMUNITY)
Admission: EM | Disposition: A | Discharge status: Home / Self Care | Payer: Self-pay | Source: Home / Self Care | Attending: Internal Medicine

## 2016-08-01 ENCOUNTER — Encounter (HOSPITAL_COMMUNITY): Payer: Self-pay | Admitting: Cardiology

## 2016-08-01 DIAGNOSIS — I493 Ventricular premature depolarization: Secondary | ICD-10-CM | POA: Diagnosis present

## 2016-08-01 DIAGNOSIS — I5022 Chronic systolic (congestive) heart failure: Secondary | ICD-10-CM

## 2016-08-01 DIAGNOSIS — I429 Cardiomyopathy, unspecified: Secondary | ICD-10-CM

## 2016-08-01 HISTORY — PX: BIV ICD INSERTION CRT-D: EP1195

## 2016-08-01 LAB — BASIC METABOLIC PANEL
Anion gap: 9 (ref 5–15)
BUN: 15 mg/dL (ref 6–20)
CHLORIDE: 101 mmol/L (ref 101–111)
CO2: 26 mmol/L (ref 22–32)
CREATININE: 2.37 mg/dL — AB (ref 0.61–1.24)
Calcium: 9.4 mg/dL (ref 8.9–10.3)
GFR calc non Af Amer: 30 mL/min — ABNORMAL LOW (ref >60)
GFR, EST AFRICAN AMERICAN: 35 mL/min — AB (ref >60)
Glucose, Bld: 102 mg/dL — ABNORMAL HIGH (ref 65–99)
POTASSIUM: 4.6 mmol/L (ref 3.5–5.1)
Sodium: 136 mmol/L (ref 135–145)

## 2016-08-01 LAB — MAGNESIUM: Magnesium: 2.1 mg/dL (ref 1.7–2.4)

## 2016-08-01 SURGERY — BIV ICD INSERTION CRT-D
Anesthesia: LOCAL

## 2016-08-01 MED ORDER — ACETAMINOPHEN 325 MG PO TABS
325.0000 mg | ORAL_TABLET | ORAL | Status: DC | PRN
  Administered 2016-08-01: 650 mg via ORAL
  Filled 2016-08-01: qty 2

## 2016-08-01 MED ORDER — IOPAMIDOL (ISOVUE-370) INJECTION 76%
INTRAVENOUS | Status: DC | PRN
Start: 2016-08-01 — End: 2016-08-01
  Administered 2016-08-01: 5 mL via INTRAVENOUS

## 2016-08-01 MED ORDER — HEPARIN (PORCINE) IN NACL 2-0.9 UNIT/ML-% IJ SOLN
INTRAMUSCULAR | Status: AC
  Filled 2016-08-01: qty 500

## 2016-08-01 MED ORDER — FENTANYL CITRATE (PF) 100 MCG/2ML IJ SOLN
INTRAMUSCULAR | Status: DC | PRN
  Administered 2016-08-01: 25 ug via INTRAVENOUS
  Administered 2016-08-01: 12.5 ug via INTRAVENOUS
  Administered 2016-08-01: 25 ug via INTRAVENOUS

## 2016-08-01 MED ORDER — MIDAZOLAM HCL 5 MG/5ML IJ SOLN
INTRAMUSCULAR | Status: AC
  Filled 2016-08-01: qty 5

## 2016-08-01 MED ORDER — MIDAZOLAM HCL 5 MG/5ML IJ SOLN
INTRAMUSCULAR | Status: DC | PRN
  Administered 2016-08-01 (×5): 1 mg via INTRAVENOUS

## 2016-08-01 MED ORDER — LIDOCAINE HCL (PF) 1 % IJ SOLN
INTRAMUSCULAR | Status: DC | PRN
  Administered 2016-08-01: 45 mL via INTRADERMAL

## 2016-08-01 MED ORDER — LIDOCAINE HCL (PF) 1 % IJ SOLN
INTRAMUSCULAR | Status: AC
  Filled 2016-08-01: qty 60

## 2016-08-01 MED ORDER — FENTANYL CITRATE (PF) 100 MCG/2ML IJ SOLN
INTRAMUSCULAR | Status: AC
  Filled 2016-08-01: qty 2

## 2016-08-01 MED ORDER — CEFAZOLIN SODIUM-DEXTROSE 2-4 GM/100ML-% IV SOLN
INTRAVENOUS | Status: AC
  Filled 2016-08-01: qty 100

## 2016-08-01 MED ORDER — HEPARIN (PORCINE) IN NACL 2-0.9 UNIT/ML-% IJ SOLN
INTRAMUSCULAR | Status: DC | PRN
  Administered 2016-08-01: 1000 mL

## 2016-08-01 MED ORDER — ONDANSETRON HCL 4 MG/2ML IJ SOLN: 4.0000 mg | Freq: Four times a day (QID) | INTRAMUSCULAR | Status: DC | PRN

## 2016-08-01 MED ORDER — IOPAMIDOL (ISOVUE-370) INJECTION 76%
INTRAVENOUS | Status: AC
  Filled 2016-08-01: qty 50

## 2016-08-01 MED ORDER — SODIUM CHLORIDE 0.9 % IR SOLN
Status: AC
  Filled 2016-08-01: qty 2

## 2016-08-01 MED ORDER — CEFAZOLIN IN D5W 1 GM/50ML IV SOLN
1.0000 g | Freq: Four times a day (QID) | INTRAVENOUS | Status: AC
  Administered 2016-08-01 – 2016-08-02 (×3): 1 g via INTRAVENOUS
  Filled 2016-08-01 (×3): qty 50

## 2016-08-01 SURGICAL SUPPLY — 20 items
CABLE SURGICAL S-101-97-12 (CABLE) ×2 IMPLANT
CATH ATTAIN COM SURV 6250V-3D (CATHETERS) ×2 IMPLANT
CATH ATTAIN COM SURV 6250V-AM (CATHETERS) ×2 IMPLANT
CATH ATTAIN COM SURV 6250V-EH (CATHETERS) ×2 IMPLANT
CATH ATTAIN COM SURV 6250V-MB2 (CATHETERS) ×2 IMPLANT
CATH ATTAIN SELECT 6238TEL (CATHETERS) ×2 IMPLANT
CATH CPS DIRECT 135 DS2C020 (CATHETERS) ×2 IMPLANT
CATH HEX JOSEPH 2-5-2 65CM 6F (CATHETERS) ×2 IMPLANT
CATH RIGHTSITE C315HIS02 (CATHETERS) ×2 IMPLANT
ICD CLARIA MRI DTMA1D4 (ICD Generator) ×2 IMPLANT
KIT ESSENTIALS PG (KITS) ×2 IMPLANT
LEAD CAPSURE NOVUS 5076-52CM (Lead) ×2 IMPLANT
LEAD SPRINT QUAT SEC 6935M-62 (Lead) ×2 IMPLANT
PAD DEFIB LIFELINK (PAD) ×2 IMPLANT
SHEATH CLASSIC 7F (SHEATH) ×2 IMPLANT
SHEATH CLASSIC 9.5F (SHEATH) ×2 IMPLANT
SHEATH CLASSIC 9F (SHEATH) ×2 IMPLANT
SHIELD RADPAD SCOOP 12X17 (MISCELLANEOUS) ×2 IMPLANT
TRAY PACEMAKER INSERTION (PACKS) ×2 IMPLANT
WIRE HI TORQ VERSACORE-J 145CM (WIRE) ×2 IMPLANT

## 2016-08-01 NOTE — Progress Notes (Signed)
Progress Note  Patient Name: David [name] Date of Encounter: 08/01/2016  Primary Cardiologist: Dr. Dillard Essex  Subjective   No chest pain or sob. Feels better. Minimal palpitations. No overnight ventricular ectopy.  Inpatient Medications    Scheduled Meds: . [MAR Hold] amiodarone  200 mg Oral TID  . [MAR Hold] aspirin  81 mg Oral Daily  .  ceFAZolin (ANCEF) IV  2 g Intravenous On Call  . gentamicin irrigation  80 mg Irrigation On Call  . [MAR Hold] heparin  5,000 Units Subcutaneous Q8H  . [MAR Hold] ivabradine  2.5 mg Oral BID WC  . [MAR Hold] multivitamin with minerals  1 tablet Oral Daily  . [MAR Hold] ranolazine  500 mg Oral BID  . [MAR Hold] rosuvastatin  20 mg Oral q1800  . [MAR Hold] sodium chloride flush  3 mL Intravenous Q12H  . sodium chloride flush  3 mL Intravenous Q12H  . [MAR Hold] ticagrelor  90 mg Oral BID   Continuous Infusions: . sodium chloride 50 mL/hr at 08/01/16 0559  . sodium chloride 250 mL (08/01/16 0600)   PRN Meds: [MAR Hold] sodium chloride, [MAR Hold] acetaminophen, [MAR Hold] ALPRAZolam, [MAR Hold] diphenhydrAMINE, [MAR Hold] diphenhydrAMINE-zinc acetate, [MAR Hold] nitroGLYCERIN, [MAR Hold] ondansetron (ZOFRAN) IV, [MAR Hold] sodium chloride flush, sodium chloride flush   Vital Signs    Vitals:   07/31/16 0338 07/31/16 1403 07/31/16 1955 08/01/16 0358  BP: 103/66 119/71 107/66 (!) 94/58  Pulse: 70 69 66 65  Resp: 18 18 18 18   Temp: 97.6 F (36.4 C) 98.1 F (36.7 C) 97.9 F (36.6 C) 98 F (36.7 C)  TempSrc: Oral Oral Oral Oral  SpO2: 100% 99% 100% 100%  Weight: 156 lb 12 oz (71.1 kg)   155 lb 10.3 oz (70.6 kg)  Height:        Intake/Output Summary (Last 24 hours) at 08/01/16 1027 Last data filed at 08/01/16 0939  Gross per 24 hour  Intake              240 ml  Output              400 ml  Net             -160 ml   Filed Weights   07/30/16 0655 07/31/16 0338 08/01/16 0358  Weight: 155 lb 11.2 oz (70.6 kg) 156 lb 12 oz  (71.1 kg) 155 lb 10.3 oz (70.6 kg)    Telemetry    NSR with rare PVC's - Personally Reviewed  ECG    None today - Personally Reviewed  Physical Exam   GEN: No acute distress.   Neck: 6 cm JVD Cardiac: RRR, no murmurs, rubs, or gallops.  Respiratory: Clear to auscultation bilaterally. GI: Soft, nontender, non-distended  MS: No edema; No deformity. Neuro:  Nonfocal  Psych: Normal affect   Labs    Chemistry  Recent Labs Lab 07/29/16 1022 07/31/16 0837 08/01/16 0303  NA 136 136 136  K 4.1 4.4 4.6  CL 103 102 101  CO2 25 25 26   GLUCOSE 122* 124* 102*  BUN 12 14 15   CREATININE 2.04* 2.13* 2.37*  CALCIUM 9.3 9.8 9.4  GFRNONAA 36* 34* 30*  GFRAA 41* 39* 35*  ANIONGAP 8 9 9      Hematology  Recent Labs Lab 07/27/16 1930 07/31/16 0837  WBC 13.7* 14.6*  RBC 5.54 5.35  HGB 15.2 14.7  HCT 45.7 44.4  MCV 82.5 83.0  MCH 27.4 27.5  MCHC 33.3  33.1  RDW 14.2 14.4  PLT 255 230    Cardiac Enzymes  Recent Labs Lab 07/28/16 0106 07/28/16 0636 07/28/16 1212  TROPONINI 0.09* 0.10* 0.08*     Recent Labs Lab 07/27/16 1932  TROPIPOC 0.07     BNP  Recent Labs Lab 07/27/16 1930  BNP 321.6*     DDimer No results for input(s): DDIMER in the last 168 hours.   Radiology    No results found.  Cardiac Studies   none  Patient Profile     51 y.o. male admitted with PEA arrest, complex ventricular ectopy and negative EP study with advanced CHF and dense ventricular ectopy, now nicely suppressed with amiodarone  Assessment & Plan    1. NVSVT/PVC's - his dense ventricular ectopy has resolved. Continue amiodarone. 2. Chronic systolic heart failure -improved with diuretic therapy. Plan for CR-D placement today. Risks and benefits discussed. Risks include but not limited to bleeding, infection, tamponade, pneumothorax. He understands the risks of the procedure and has agreed to CRT-D placement. 3. CAD 4. LBBB Plan: biV ICD . Continue current meds.    Signed, Jadia Capers Meredith Leeds, MD  08/01/2016, 10:27 AM  Patient ID: David [name], male   DOB: 10-Feb-1966, 51 y.o.   MRN: [id number]

## 2016-08-01 NOTE — Progress Notes (Signed)
PROGRESS NOTE    David [name]  BSJ:[id number] DOB: 1965-11-21 DOA: 07/27/2016 PCP: David Rile, MD     Brief Narrative:  David [name] is an 51 y.o. male with medical history significant for coronary artery disease with stents, chronic systolic CHF with EF 76-54%, chronic kidney disease stage II, and hypertension presenting to the emergency department with intermittent chest pains and palpitations. Patient has a complex cardiac history with stents placed in 2009 and 2010,and admission in January of this year following a PEA arrest, resulting in additional stenting. He was evaluated for ICD placement but determined not to be a candidate per the patient's report. He was evaluated by his cardiologist on 07/19/2016 noted to still be having 16% PVCs, and mexiletine was changed to amiodarone. Patient started taking amiodarone on 07/24/2016 and began to notice some mild general malaise and weakness the following day. He also began to note some worsening palpitations and the sensation as though his heart was stopping for a few seconds. He describes an episode just prior to his presentation in which he was walking and experienced acute onset of severe central chest pain and shortness of breath with a sensation that his heart had stopped. He remarked to his wife that he felt like he was dying, and at her urging, he came into the ED for evaluation. The episode resolved in less than a minute and he is felt fatigued since, but with no residual chest pain or significant dyspnea.  EKG featured sinus rhythm with first degree AV nodal block, PVCs, and left bundle branch block which was also seen previously. Chemistry panels notable for serum creatinine of 2.25, up from an apparent baseline of 1.3.  Troponin was within the normal limits and BNP was elevated to 322. Runs of nonsustained ventricular tachycardia were noted during the ED stay. Cardiology was consulted by the ED physician.   Assessment & Plan:   Active  Problems:   CAD (coronary artery disease), native coronary artery   Hypertension, essential   Chronic systolic HF (heart failure) (HCC)   Hypokalemia   Chest pain   VT (ventricular tachycardia) (HCC)   AKI (acute kidney injury) ([place])   Near syncope  NSVT Mild troponin elevation noted. EKG with sinus rhythm, 1 degree AV block, PVC's, and known LBBB. Noted to have episodes of NSVT, and was recently evaluated for ICD placement. Cardiology/EP following. Replete potassium and keep level around 4. Continue aspirin, Brilinta and Crestor. Ranolazine added by cardiology. Continue amiodarone. ICD placement scheduled for 08/01/16.  Chronic systolic CHF  TTE (09/27/01) with EF 15-20%, diffuse HK, inferior and posterior AK, moderate MR, and moderate LAE; Repeat Echo shows EF 25% with ongoing diffuse HK. Diuretics currently on hold due to acute kidney injury. Continue to monitor fluid balance closely. Volume status stable currently   CAD  Pt has known CAD with stents. No complaints of chest pain. Continue ASA 81, Brilinta, Crestor, ivabridine.   Acute kidney injury superimposed on CKD stage II  SCr is 2.25 on admission, up from apparent baseline of ~1.3. Diuretics on hold. Creatinine remains elevated over usual baseline values. Continue to monitor.   Paroxysmal atrial fibrillation  Currently in sinus rhythm with frequent PVC's/NSVT noted on admission. CHADS-VASc at least 2 (CAD, CHF). Followed by cardiology and no longer on anticoagulant. Monitoring on telemetry.   DVT prophylaxis: heparin subq  Code Status: full Family Communication: no family at bedside Disposition Plan: pending procedure 4/9   Consultants:   Cardiology  Heart failure  EP  Procedures:   ICD 08/01/2016   Antimicrobials:   None    Subjective: No complaints today. Denies any chest pain, shortness of breath, cough, nausea or vomiting. Awaiting procedure this afternoon.   Objective: Vitals:   07/31/16 0338  07/31/16 1403 07/31/16 1955 08/01/16 0358  BP: 103/66 119/71 107/66 (!) 94/58  Pulse: 70 69 66 65  Resp: 18 18 18 18   Temp: 97.6 F (36.4 C) 98.1 F (36.7 C) 97.9 F (36.6 C) 98 F (36.7 C)  TempSrc: Oral Oral Oral Oral  SpO2: 100% 99% 100% 100%  Weight: 71.1 kg (156 lb 12 oz)   70.6 kg (155 lb 10.3 oz)  Height:        Intake/Output Summary (Last 24 hours) at 08/01/16 1134 Last data filed at 08/01/16 0939  Gross per 24 hour  Intake              240 ml  Output              400 ml  Net             -160 ml   Filed Weights   07/30/16 0655 07/31/16 0338 08/01/16 0358  Weight: 70.6 kg (155 lb 11.2 oz) 71.1 kg (156 lb 12 oz) 70.6 kg (155 lb 10.3 oz)    Examination:  General exam: Appears calm and comfortable  Respiratory system: Clear to auscultation. Respiratory effort normal. Cardiovascular system: S1 & S2 heard, RRR. No JVD, murmurs, rubs, gallops or clicks. No pedal edema. Gastrointestinal system: Abdomen is nondistended, soft and nontender. No organomegaly or masses felt. Normal bowel sounds heard. Central nervous system: Alert and oriented. No focal neurological deficits. Extremities: Symmetric 5 x 5 power. Skin: No rashes, lesions or ulcers Psychiatry: Judgement and insight appear normal. Mood & affect appropriate.   Data Reviewed: I have personally reviewed following labs and imaging studies  CBC:  Recent Labs Lab 07/27/16 1930 07/31/16 0837  WBC 13.7* 14.6*  HGB 15.2 14.7  HCT 45.7 44.4  MCV 82.5 83.0  PLT 255 867   Basic Metabolic Panel:  Recent Labs Lab 07/27/16 1930 07/28/16 0636 07/29/16 1022 07/31/16 0837 08/01/16 0303  NA 133* 136 136 136 136  K 3.4* 3.4* 4.1 4.4 4.6  CL 95* 98* 103 102 101  CO2 24 28 25 25 26   GLUCOSE 88 77 122* 124* 102*  BUN 20 18 12 14 15   CREATININE 2.25* 2.20* 2.04* 2.13* 2.37*  CALCIUM 9.5 9.6 9.3 9.8 9.4  MG 2.4 2.3  --  2.3 2.1  PHOS 3.1  --   --   --   --    GFR: Estimated Creatinine Clearance: 31.7 mL/min (A)  (by C-G formula based on SCr of 2.37 mg/dL (H)). Liver Function Tests: No results for input(s): AST, ALT, ALKPHOS, BILITOT, PROT, ALBUMIN in the last 168 hours. No results for input(s): LIPASE, AMYLASE in the last 168 hours. No results for input(s): AMMONIA in the last 168 hours. Coagulation Profile: No results for input(s): INR, PROTIME in the last 168 hours. Cardiac Enzymes:  Recent Labs Lab 07/28/16 0106 07/28/16 0636 07/28/16 1212  TROPONINI 0.09* 0.10* 0.08*   BNP (last 3 results) No results for input(s): PROBNP in the last 8760 hours. HbA1C: No results for input(s): HGBA1C in the last 72 hours. CBG: No results for input(s): GLUCAP in the last 168 hours. Lipid Profile: No results for input(s): CHOL, HDL, LDLCALC, TRIG, CHOLHDL, LDLDIRECT in the last 72 hours. Thyroid Function Tests: No results  for input(s): TSH, T4TOTAL, FREET4, T3FREE, THYROIDAB in the last 72 hours. Anemia Panel: No results for input(s): VITAMINB12, FOLATE, FERRITIN, TIBC, IRON, RETICCTPCT in the last 72 hours. Sepsis Labs: No results for input(s): PROCALCITON, LATICACIDVEN in the last 168 hours.  Recent Results (from the past 240 hour(s))  MRSA PCR Screening     Status: None   Collection Time: 07/31/16 12:50 AM  Result Value Ref Range Status   MRSA by PCR NEGATIVE NEGATIVE Final    Comment:        The GeneXpert MRSA Assay (FDA approved for NASAL specimens only), is one component of a comprehensive MRSA colonization surveillance program. It is not intended to diagnose MRSA infection nor to guide or monitor treatment for MRSA infections.        Radiology Studies: No results found.    Scheduled Meds: . [MAR Hold] amiodarone  200 mg Oral TID  . [MAR Hold] aspirin  81 mg Oral Daily  . gentamicin irrigation  80 mg Irrigation On Call  . [MAR Hold] heparin  5,000 Units Subcutaneous Q8H  . [MAR Hold] ivabradine  2.5 mg Oral BID WC  . [MAR Hold] multivitamin with minerals  1 tablet Oral  Daily  . [MAR Hold] ranolazine  500 mg Oral BID  . [MAR Hold] rosuvastatin  20 mg Oral q1800  . [MAR Hold] sodium chloride flush  3 mL Intravenous Q12H  . sodium chloride flush  3 mL Intravenous Q12H  . [MAR Hold] ticagrelor  90 mg Oral BID   Continuous Infusions: . sodium chloride 50 mL/hr at 08/01/16 0559  . sodium chloride 250 mL (08/01/16 0600)     LOS: 3 days    Time spent: 30 minutes   Dessa Phi, DO [facility] [email or web address] Password [facility] 08/01/2016, 11:34 AM

## 2016-08-01 NOTE — Progress Notes (Signed)
Advanced Heart Failure Rounding Note   Subjective:    Feels good. No dyspnea or orthopnea. PVCs suppressed on Ranex and amio.    Objective:   Weight Range:  Vital Signs:   Temp:  [97.9 F (36.6 C)-98.1 F (36.7 C)] 98 F (36.7 C) (04/09 0358) Pulse Rate:  [65-69] 65 (04/09 0358) Resp:  [18] 18 (04/09 0358) BP: (94-119)/(58-71) 94/58 (04/09 0358) SpO2:  [99 %-100 %] 100 % (04/09 0358) Weight:  [70.6 kg (155 lb 10.3 oz)] 70.6 kg (155 lb 10.3 oz) (04/09 0358) Last BM Date: 07/31/16  Weight change: Filed Weights   07/30/16 0655 07/31/16 0338 08/01/16 0358  Weight: 70.6 kg (155 lb 11.2 oz) 71.1 kg (156 lb 12 oz) 70.6 kg (155 lb 10.3 oz)    Intake/Output:   Intake/Output Summary (Last 24 hours) at 08/01/16 0523 Last data filed at 07/31/16 1300  Gross per 24 hour  Intake              480 ml  Output              250 ml  Net              230 ml     Physical Exam: General:  Well appearing. No resp difficulty HEENT: normal Neck: supple. JVP 6  Carotids 2+ bilat; no bruits. No lymphadenopathy or thryomegaly appreciated. Cor: PMI laterally displaced. Regular rate & rhythm. No rubs, gallops. Soft MR Lungs: clear Abdomen: soft, nontender, nondistended. No hepatosplenomegaly. No bruits or masses. Good bowel sounds. Extremities: no cyanosis, clubbing, rash, edema Neuro: alert & orientedx3, cranial nerves grossly intact. moves all 4 extremities w/o difficulty. Affect pleasant  Telemetry: NSR. 60s. Occasional PVC No further NSVT. Personally reviewed    Labs: Basic Metabolic Panel:  Recent Labs Lab 07/27/16 1930 07/28/16 0636 07/29/16 1022 07/31/16 0837 08/01/16 0303  NA 133* 136 136 136 136  K 3.4* 3.4* 4.1 4.4 4.6  CL 95* 98* 103 102 101  CO2 24 28 25 25 26   GLUCOSE 88 77 122* 124* 102*  BUN 20 18 12 14 15   CREATININE 2.25* 2.20* 2.04* 2.13* 2.37*  CALCIUM 9.5 9.6 9.3 9.8 9.4  MG 2.4 2.3  --  2.3 2.1  PHOS 3.1  --   --   --   --     Liver Function  Tests: No results for input(s): AST, ALT, ALKPHOS, BILITOT, PROT, ALBUMIN in the last 168 hours. No results for input(s): LIPASE, AMYLASE in the last 168 hours. No results for input(s): AMMONIA in the last 168 hours.  CBC:  Recent Labs Lab 07/27/16 1930 07/31/16 0837  WBC 13.7* 14.6*  HGB 15.2 14.7  HCT 45.7 44.4  MCV 82.5 83.0  PLT 255 230    Cardiac Enzymes:  Recent Labs Lab 07/28/16 0106 07/28/16 0636 07/28/16 1212  TROPONINI 0.09* 0.10* 0.08*    BNP: BNP (last 3 results)  Recent Labs  05/03/16 0435 05/19/16 1554 07/27/16 1930  BNP 1,034.8* 470.0* 321.6*    ProBNP (last 3 results) No results for input(s): PROBNP in the last 8760 hours.    Other results:  Imaging: No results found.   Medications:     Scheduled Medications: . amiodarone  200 mg Oral TID  . aspirin  81 mg Oral Daily  .  ceFAZolin (ANCEF) IV  2 g Intravenous On Call  . chlorhexidine  60 mL Topical Once  . chlorhexidine  60 mL Topical Once  . gentamicin irrigation  80 mg Irrigation On Call  . heparin  5,000 Units Subcutaneous Q8H  . ivabradine  2.5 mg Oral BID WC  . multivitamin with minerals  1 tablet Oral Daily  . ranolazine  500 mg Oral BID  . rosuvastatin  20 mg Oral q1800  . sodium chloride flush  3 mL Intravenous Q12H  . sodium chloride flush  3 mL Intravenous Q12H  . spironolactone  12.5 mg Oral Daily  . ticagrelor  90 mg Oral BID    Infusions: . sodium chloride    . sodium chloride      PRN Medications: sodium chloride, acetaminophen, ALPRAZolam, diphenhydrAMINE, diphenhydrAMINE-zinc acetate, nitroGLYCERIN, ondansetron (ZOFRAN) IV, sodium chloride flush, sodium chloride flush   Assessment/Plan/Discussion:   1. Presyncope/VT 2. NSVT/frequent PVCs -K 4.6 Mag 2.1  PVC burden much improved on amio and Ranexa. Will continue per EP  3. Chronic Systolic HF - Echo this admit EF 25%. 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 Digoxin stopped   by EP - Off Bidil due to low BP. Wil try to retart 0.5 tab per day prior to d/c - On spiro. Will stop with AKI.  - No ARB with low BP and AKI - Continue ivabradine   - Volume status looks good. Keep off lasix for now.  4. CAD - S/P Stent RCA/LAD 04/2016. No CP. Continue ticagrelor + aspirin+ statin.  5. AKI likely due to ATN with VT - Creatinine down 2.2->2.0 Now back up to 2.37 Baseline 1.7-1.8.Does not appear low output. Will have to avoid PICC or RHC due to CRT-D. If continues to climb may need empiric course of inotropes.  6. LBBB.  - For CRT-D Today   Length of Stay: 3 Ezzard Ditmer, MD  5:23 AM Advanced Heart Failure Team Pager [phone] (M-F; 7a - 4p)  Please contact [facility] for night-coverage after hours (4p -7a ) and weekends on [email or web address]

## 2016-08-02 ENCOUNTER — Ambulatory Visit (HOSPITAL_COMMUNITY): Payer: Medicare HMO

## 2016-08-02 ENCOUNTER — Encounter (HOSPITAL_COMMUNITY): Payer: Self-pay | Admitting: Cardiology

## 2016-08-02 DIAGNOSIS — I472 Ventricular tachycardia: Secondary | ICD-10-CM

## 2016-08-02 DIAGNOSIS — I4729 Other ventricular tachycardia: Secondary | ICD-10-CM

## 2016-08-02 DIAGNOSIS — I429 Cardiomyopathy, unspecified: Secondary | ICD-10-CM

## 2016-08-02 LAB — CBC
HCT: 40.3 % (ref 39.0–52.0)
HEMOGLOBIN: 13.6 g/dL (ref 13.0–17.0)
MCH: 27.3 pg (ref 26.0–34.0)
MCHC: 33.7 g/dL (ref 30.0–36.0)
MCV: 80.9 fL (ref 78.0–100.0)
Platelets: 221 10*3/uL (ref 150–400)
RBC: 4.98 MIL/uL (ref 4.22–5.81)
RDW: 14.6 % (ref 11.5–15.5)
WBC: 10 10*3/uL (ref 4.0–10.5)

## 2016-08-02 LAB — BASIC METABOLIC PANEL
Anion gap: 12 (ref 5–15)
BUN: 15 mg/dL (ref 6–20)
CALCIUM: 9.2 mg/dL (ref 8.9–10.3)
CO2: 20 mmol/L — ABNORMAL LOW (ref 22–32)
Chloride: 103 mmol/L (ref 101–111)
Creatinine, Ser: 2.17 mg/dL — ABNORMAL HIGH (ref 0.61–1.24)
GFR calc Af Amer: 38 mL/min — ABNORMAL LOW (ref >60)
GFR, EST NON AFRICAN AMERICAN: 33 mL/min — AB (ref >60)
GLUCOSE: 96 mg/dL (ref 65–99)
Potassium: 4.2 mmol/L (ref 3.5–5.1)
Sodium: 135 mmol/L (ref 135–145)

## 2016-08-02 LAB — MAGNESIUM: Magnesium: 2.2 mg/dL (ref 1.7–2.4)

## 2016-08-02 MED ORDER — AMIODARONE HCL 200 MG PO TABS: 200.0000 mg | ORAL_TABLET | Freq: Two times a day (BID) | ORAL | 0 refills | Status: DC

## 2016-08-02 MED ORDER — FUROSEMIDE 20 MG PO TABS: ORAL_TABLET | ORAL | 0 refills | Status: DC

## 2016-08-02 MED ORDER — RANOLAZINE ER 500 MG PO TB12: 500.0000 mg | ORAL_TABLET | Freq: Two times a day (BID) | ORAL | 0 refills | Status: DC

## 2016-08-02 MED ORDER — NITROGLYCERIN 0.4 MG SL SUBL: 0.4000 mg | SUBLINGUAL_TABLET | SUBLINGUAL | 0 refills | Status: DC | PRN

## 2016-08-02 MED ORDER — IVABRADINE HCL 5 MG PO TABS: 2.5000 mg | ORAL_TABLET | Freq: Two times a day (BID) | ORAL | 0 refills | Status: DC

## 2016-08-02 MED ORDER — ISOSORB DINITRATE-HYDRALAZINE 20-37.5 MG PO TABS: 0.5000 | ORAL_TABLET | Freq: Two times a day (BID) | ORAL | 0 refills | Status: DC

## 2016-08-02 NOTE — Progress Notes (Signed)
Advanced Heart Failure Rounding Note   Subjective:    S/P Dual chamber ICD. Unable to place LV lead.    Wants to go home. Denies SOB/CP.    Objective:   Weight Range:  Vital Signs:   Temp:  [97.5 F (36.4 C)-97.6 F (36.4 C)] 97.6 F (36.4 C) (04/10 0503) Pulse Rate:  [0-224] 67 (04/10 0503) Resp:  [0-27] 17 (04/10 0503) BP: (81-137)/(48-89) 110/67 (04/10 0503) SpO2:  [0 %-100 %] 100 % (04/10 0503) Weight:  [161 lb 6 oz (73.2 kg)] 161 lb 6 oz (73.2 kg) (04/10 0503) Last BM Date: 07/31/16  Weight change: Filed Weights   07/31/16 0338 08/01/16 0358 08/02/16 0503  Weight: 156 lb 12 oz (71.1 kg) 155 lb 10.3 oz (70.6 kg) 161 lb 6 oz (73.2 kg)    Intake/Output:   Intake/Output Summary (Last 24 hours) at 08/02/16 0857 Last data filed at 08/01/16 1602  Gross per 24 hour  Intake                0 ml  Output              700 ml  Net             -700 ml     Physical Exam: General:  Well appearing. No resp difficulty. Sitting in the chair.  HEENT: normal Neck: supple. no JVD. Carotids 2+ bilat; no bruits. No lymphadenopathy or thryomegaly appreciated. Cor: PMI nondisplaced. Regular rate & rhythm. No rubs, gallops or murmurs. Lungs: clear Abdomen: soft, nontender, nondistended. No hepatosplenomegaly. No bruits or masses. Good bowel sounds. Extremities: no cyanosis, clubbing, rash, edema Neuro: alert & orientedx3, cranial nerves grossly intact. moves all 4 extremities w/o difficulty. Affect pleasant Skin: L upper chest. Ecchymotic. Hematoma noted. Steri strips in place.   Telemetry: NSR 70s with PVCs. Personally reviewed.     Labs: Basic Metabolic Panel:  Recent Labs Lab 07/27/16 1930 07/28/16 0636 07/29/16 1022 07/31/16 0837 08/01/16 0303 08/02/16 0424  NA 133* 136 136 136 136 135  K 3.4* 3.4* 4.1 4.4 4.6 4.2  CL 95* 98* 103 102 101 103  CO2 24 28 25 25 26  20*  GLUCOSE 88 77 122* 124* 102* 96  BUN 20 18 12 14 15 15   CREATININE 2.25* 2.20* 2.04* 2.13*  2.37* 2.17*  CALCIUM 9.5 9.6 9.3 9.8 9.4 9.2  MG 2.4 2.3  --  2.3 2.1 2.2  PHOS 3.1  --   --   --   --   --     Liver Function Tests: No results for input(s): AST, ALT, ALKPHOS, BILITOT, PROT, ALBUMIN in the last 168 hours. No results for input(s): LIPASE, AMYLASE in the last 168 hours. No results for input(s): AMMONIA in the last 168 hours.  CBC:  Recent Labs Lab 07/27/16 1930 07/31/16 0837 08/02/16 0424  WBC 13.7* 14.6* 10.0  HGB 15.2 14.7 13.6  HCT 45.7 44.4 40.3  MCV 82.5 83.0 80.9  PLT 255 230 221    Cardiac Enzymes:  Recent Labs Lab 07/28/16 0106 07/28/16 0636 07/28/16 1212  TROPONINI 0.09* 0.10* 0.08*    BNP: BNP (last 3 results)  Recent Labs  05/03/16 0435 05/19/16 1554 07/27/16 1930  BNP 1,034.8* 470.0* 321.6*    ProBNP (last 3 results) No results for input(s): PROBNP in the last 8760 hours.    Other results:  Imaging: Dg Chest 2 View  Result Date: 08/02/2016 CLINICAL DATA:  Defibrillator placement EXAM: CHEST  2 VIEW  COMPARISON:  07/27/2016 FINDINGS: Interval placement of left AICD with leads in the right atrium and right ventricle. No pneumothorax. Heart is borderline in size. No effusions or edema. No confluent opacities. No acute bony abnormality. IMPRESSION: Left AICD placement without pneumothorax. Electronically Signed   By: Rolm Baptise M.D.   On: 08/02/2016 07:17     Medications:     Scheduled Medications: . amiodarone  200 mg Oral TID  . aspirin  81 mg Oral Daily  . heparin  5,000 Units Subcutaneous Q8H  . ivabradine  2.5 mg Oral BID WC  . multivitamin with minerals  1 tablet Oral Daily  . ranolazine  500 mg Oral BID  . rosuvastatin  20 mg Oral q1800  . sodium chloride flush  3 mL Intravenous Q12H  . ticagrelor  90 mg Oral BID    Infusions:   PRN Medications: sodium chloride, acetaminophen, acetaminophen, ALPRAZolam, diphenhydrAMINE, diphenhydrAMINE-zinc acetate, nitroGLYCERIN, ondansetron (ZOFRAN) IV, sodium chloride  flush   Assessment/Plan/Discussion:   1. Presyncope/VT 2. NSVT/frequent PVCs -K 4.2 Continue amio and ranexa per EP.  3. Chronic Systolic HF - Echo this admit EF 25%. 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 Off dig and spiro with elevated creatinine.  - Hold off on bidil. -  - No ARB with low BP and AKI - Continue ivabradine   - Restart bidil 1/2 tablet twice a day.  - Volume status looks good. Take lasix 20 mg po  for 3-4 pounds weight gain in 24 hours.  4. CAD - S/P Stent RCA/LAD 04/2016.  No CP. Cotinue current meds. --->ticagrelor + aspirin+ statin.  5. AKI likely due to ATN with VT -Creatinine trending down 2.3>2.17 - Off spiro 6. LBBB.  - S/P [employer] Dual chamber ICD. Unable to place LV lead. He will need follow up with Dr Cyndia Bent in a month per EP.    Follow up in the HF clinic nest week set up with Dr Haroldine Laws.   HF meds for d/c  ivabradine 2.5 mg twice a day Lasix 20 mg as need for 3-4 pound weight gain in 24 hours Bidil 1/2 tab two times a day.  Amio 200 mg twice a day x 4 weeks then 200 mg daily Ranexa 500 mg twice a day  Ticagrelor 90 mg twice a day.   Length of Stay: 3 Darrick Grinder, NP  8:57 AM Advanced Heart Failure Team Pager [phone] (M-F; 7a - 4p)  Please contact [facility] for night-coverage after hours (4p -7a ) and weekends on [email or web address]

## 2016-08-02 NOTE — Discharge Summary (Signed)
Physician Discharge Summary  David [name] TIR:[id number] DOB: 02-15-1966 DOA: 07/27/2016  PCP: David Rile, MD  Admit date: 07/27/2016 Discharge date: 08/02/2016  Admitted From: Home Disposition:  Home  Recommendations for Outpatient Follow-up:  1. Follow up with PCP in 1 week 2. Routine wound care, follow up with David Marshall NP in 1 week  3. Follow up with Heart Failure clinic with Dr. Haroldine [name] in 1 week 4. May need referral to follow up with Dr. Cyndia [name] (EP) in 1 month 5. Please obtain BMP in 1 week   Home Health: No  Equipment/Devices: None   Discharge Condition: Stable CODE STATUS: Full  Diet recommendation: Heart healthy   Brief/Interim Summary: David [name] an 51 y.o.malewith medical history significant for coronary artery disease with stents, chronic systolic CHF with EF 67-61%, chronic kidney disease stage II, and hypertension presenting to the emergency department with intermittent chest pains and palpitations. Patient has a complex cardiac history with stents placed in 2009 and 2010,and admission in January of this year following a PEA arrest, resulting in additional stenting. He was evaluated for ICD placement but determined not to be a candidate per the patient's report. He was evaluated by his cardiologist on 07/19/2016 noted to still be having 16% PVCs, and mexiletine was changed to amiodarone. Patient started taking amiodarone on 07/24/2016 and began to notice some mild general malaise and weakness the following day. He also began to note some worsening palpitations and the sensation as though his heart was stopping for a few seconds. He describes an episode just prior to his presentation in which he was walking and experienced acute onset of severe central chest pain and shortness of breath with a sensation that his heart had stopped. He remarked to his wife that he felt like he was dying, and at her urging, he came into the ED for evaluation. The episode resolved in  less than a minute and he is felt fatigued since, but with no residual chest pain or significant dyspnea. EKG featured sinus rhythm with first degree AV nodal block, PVCs, and left bundle branch block which was also seen previously. Chemistry panels notable for serum creatinine of 2.25, up from an apparent baseline of 1.3. Troponin was within the normal limits and BNP was elevated to 322. Runs of nonsustained ventricular tachycardia were noted during the ED stay. Patient was evaluated by heart failure team, EP while in hospital. He underwent biventricular ICD placement on 4/9, unable to place a left ventricular lead after multiple attempts. See below for details.   Discharge Diagnoses:  Active Problems:   CAD (coronary artery disease), native coronary artery   Hypertension, essential   Chronic systolic HF (heart failure) (HCC)   Hypokalemia   Chest pain   VT (ventricular tachycardia) (HCC)   AKI (acute kidney injury) ([place])   Near syncope   PVC (premature ventricular contraction)   NSVT (nonsustained ventricular tachycardia) (HCC)  NSVT Cardiology/EP following. Underwent ICD placement 4/9. Continue amiodarone  Chronic systolic CHF  TTE (12/29/07) with EF 15-20%, diffuse HK, inferior and posterior AK, moderate MR, and moderate LAE; Repeat Echo shows EF 25% with ongoing diffuse HK. Diuretics currently on hold due to acute kidney injury. Continue to monitor fluid balance closely. Volume status stable currently   HF meds for d/c  Ivabradine 2.5 mg twice a day Lasix 20 mg as need for 3-4 pound weight gain in 24 hours Bidil 1/2 tab two times a day.  Amio 200 mg twice a day x 4 weeks then  200 mg daily Ranexa 500 mg twice a day  Ticagrelor 90 mg twice a day.  CAD  Pt has known CAD with stents. No complaints of chest pain. Continue ASA 81, Crestor, ivabridine.   Acute kidney injury superimposed on CKD stage II  SCr is 2.25 on admission, up from apparent baseline of ~1.3. Creatinine remains  elevated over usual baseline values but stable. Continue to monitor.   Paroxysmal atrial fibrillation  Currently in sinus rhythm with frequent PVC's/NSVT noted on admission. CHADS-VASc at least 2 (CAD, CHF). Followed by cardiology and no longer on anticoagulant. Monitoring on telemetry.   Discharge Instructions  Discharge Instructions    (HEART FAILURE PATIENTS) Call MD:  Anytime you have any of the following symptoms: 1) 3 pound weight gain in 24 hours or 5 pounds in 1 week 2) shortness of breath, with or without a dry hacking cough 3) swelling in the hands, feet or stomach 4) if you have to sleep on extra pillows at night in order to breathe.    Complete by:  As directed    Call MD for:  difficulty breathing, headache or visual disturbances    Complete by:  As directed    Call MD for:  extreme fatigue    Complete by:  As directed    Call MD for:  persistant dizziness or light-headedness    Complete by:  As directed    Call MD for:  persistant nausea and vomiting    Complete by:  As directed    Call MD for:  redness, tenderness, or signs of infection (pain, swelling, redness, odor or green/yellow discharge around incision site)    Complete by:  As directed    Call MD for:  severe uncontrolled pain    Complete by:  As directed    Call MD for:  temperature >100.4    Complete by:  As directed    Diet - low sodium heart healthy    Complete by:  As directed    Increase activity slowly    Complete by:  As directed      Allergies as of 08/02/2016      Reactions   Plavix [clopidogrel Bisulfate] Hives      Medication List    STOP taking these medications   digoxin 0.125 MG tablet Commonly known as:  LANOXIN   spironolactone 25 MG tablet Commonly known as:  ALDACTONE     TAKE these medications   ADULT GUMMY Chew Chew 2 tablets by mouth daily.   amiodarone 200 MG tablet Commonly known as:  PACERONE Take 1 tablet (200 mg total) by mouth 2 (two) times daily.   aspirin 81 MG  chewable tablet Chew 1 tablet (81 mg total) by mouth daily.   diphenhydrAMINE 25 MG tablet Commonly known as:  BENADRYL Take 12.5 mg by mouth every 6 (six) hours as needed for itching or allergies.   diphenhydrAMINE-zinc acetate cream Commonly known as:  BENADRYL Apply 1 application topically daily as needed for itching.   furosemide 20 MG tablet Commonly known as:  LASIX Take 20mg  as needed for 3-4 pound weight gain in 24 hours What changed:  medication strength  how much to take  how to take this  when to take this  additional instructions   isosorbide-hydrALAZINE 20-37.5 MG tablet Commonly known as:  BIDIL Take 0.5 tablets by mouth 2 (two) times daily.   ivabradine 5 MG Tabs tablet Commonly known as:  CORLANOR Take 0.5 tablets (2.5 mg total)  by mouth 2 (two) times daily with a meal.   nitroGLYCERIN 0.4 MG SL tablet Commonly known as:  NITROSTAT Place 1 tablet (0.4 mg total) under the tongue every 5 (five) minutes x 3 doses as needed for chest pain.   ranolazine 500 MG 12 hr tablet Commonly known as:  RANEXA Take 1 tablet (500 mg total) by mouth 2 (two) times daily.   rosuvastatin 20 MG tablet Commonly known as:  CRESTOR Take 1 tablet (20 mg total) by mouth daily at 6 PM.   ticagrelor 90 MG Tabs tablet Commonly known as:  BRILINTA Take 1 tablet (90 mg total) by mouth 2 (two) times daily.      Follow-up Information    David Marshall, NP Follow up on 08/10/2016.   Specialty:  Cardiology Why:  at 12:40PM  Contact information: [place] [address] [phone]        Glori Bickers, MD Follow up on 08/09/2016.   Specialty:  Cardiology Why:  at 2:00 Garage Code 6000 Contact information: [address] [place] [address] [phone]        David Rile, MD. Schedule an appointment as soon as possible for a visit in 1 week(s).   Specialty:  Internal Medicine Contact information: [place] [address] [phone]          Allergies  Allergen Reactions  . Plavix [Clopidogrel Bisulfate] Hives    Consultations:  Cardiology  Heart failure  EP    Procedures/Studies: Dg Chest 2 View  Result Date: 08/02/2016 CLINICAL DATA:  Defibrillator placement EXAM: CHEST  2 VIEW COMPARISON:  07/27/2016 FINDINGS: Interval placement of left AICD with leads in the right atrium and right ventricle. No pneumothorax. Heart is borderline in size. No effusions or edema. No confluent opacities. No acute bony abnormality. IMPRESSION: Left AICD placement without pneumothorax. Electronically Signed   By: Rolm Baptise M.D.   On: 08/02/2016 07:17   Dg Chest Port 1 View  Result Date: 07/27/2016 CLINICAL DATA:  Palpitations, shortness of breath, and dizziness today, history hypertension, CHF, coronary artery disease post PTCA, ischemic cardiomyopathy EXAM: PORTABLE CHEST 1 VIEW COMPARISON:  Portable exam 1932 hours compared 04/30/2016 FINDINGS: Upper normal size of cardiac silhouette with note of coronary arterial stent. Mediastinal contours and pulmonary vascularity normal. Mild central peribronchial thickening. Lungs clear. No pleural effusion or pneumothorax. Bones unremarkable. IMPRESSION: Mild bronchitic changes without infiltrate. Electronically Signed   By: Lavonia Dana M.D.   On: 07/27/2016 19:50    Echo 07/29/2016 Study Conclusions - Left ventricle: The cavity size was severely dilated. Wall   thickness was normal. The estimated ejection fraction was 25%.   Diffuse hypokinesis. Left ventricular diastolic function   parameters were normal. - Aortic valve: There was mild regurgitation. - Mitral valve: There was mild regurgitation. - Left atrium: The atrium was moderately dilated. - Atrial septum: No defect or patent foramen ovale was identified.   ICD 08/01/2016    Discharge Exam: Vitals:   08/01/16 2025 08/02/16 0503  BP: 115/62 110/67  Pulse:  67  Resp: 17 17  Temp: 97.6 F (36.4  C) 97.6 F (36.4 C)   Vitals:   08/01/16 1448 08/01/16 1530 08/01/16 2025 08/02/16 0503  BP: (!) 101/59 (!) 97/48 115/62 110/67  Pulse: 69 68  67  Resp:   17 17  Temp:   97.6 F (36.4 C) 97.6 F (36.4 C)  TempSrc:   Oral Oral  SpO2:  97% 100%  Weight:    73.2 kg (161 lb 6 oz)  Height:        General: Pt is alert, awake, not in acute distress Cardiovascular: RRR, S1/S2 +, no rubs, no gallops Respiratory: CTA bilaterally, no wheezing, no rhonchi Abdominal: Soft, NT, ND, bowel sounds + Extremities: no edema, no cyanosis    The results of significant diagnostics from this hospitalization (including imaging, microbiology, ancillary and laboratory) are listed below for reference.     Microbiology: Recent Results (from the past 240 hour(s))  MRSA PCR Screening     Status: None   Collection Time: 07/31/16 12:50 AM  Result Value Ref Range Status   MRSA by PCR NEGATIVE NEGATIVE Final    Comment:        The GeneXpert MRSA Assay (FDA approved for NASAL specimens only), is one component of a comprehensive MRSA colonization surveillance program. It is not intended to diagnose MRSA infection nor to guide or monitor treatment for MRSA infections.      Labs: BNP (last 3 results)  Recent Labs  05/03/16 0435 05/19/16 1554 07/27/16 1930  BNP 1,034.8* 470.0* 601.0*   Basic Metabolic Panel:  Recent Labs Lab 07/27/16 1930 07/28/16 0636 07/29/16 1022 07/31/16 0837 08/01/16 0303 08/02/16 0424  NA 133* 136 136 136 136 135  K 3.4* 3.4* 4.1 4.4 4.6 4.2  CL 95* 98* 103 102 101 103  CO2 24 28 25 25 26  20*  GLUCOSE 88 77 122* 124* 102* 96  BUN 20 18 12 14 15 15   CREATININE 2.25* 2.20* 2.04* 2.13* 2.37* 2.17*  CALCIUM 9.5 9.6 9.3 9.8 9.4 9.2  MG 2.4 2.3  --  2.3 2.1 2.2  PHOS 3.1  --   --   --   --   --    Liver Function Tests: No results for input(s): AST, ALT, ALKPHOS, BILITOT, PROT, ALBUMIN in the last 168 hours. No results for input(s): LIPASE, AMYLASE in the  last 168 hours. No results for input(s): AMMONIA in the last 168 hours. CBC:  Recent Labs Lab 07/27/16 1930 07/31/16 0837 08/02/16 0424  WBC 13.7* 14.6* 10.0  HGB 15.2 14.7 13.6  HCT 45.7 44.4 40.3  MCV 82.5 83.0 80.9  PLT 255 230 221   Cardiac Enzymes:  Recent Labs Lab 07/28/16 0106 07/28/16 0636 07/28/16 1212  TROPONINI 0.09* 0.10* 0.08*   BNP: Invalid input(s): POCBNP CBG: No results for input(s): GLUCAP in the last 168 hours. D-Dimer No results for input(s): DDIMER in the last 72 hours. Hgb A1c No results for input(s): HGBA1C in the last 72 hours. Lipid Profile No results for input(s): CHOL, HDL, LDLCALC, TRIG, CHOLHDL, LDLDIRECT in the last 72 hours. Thyroid function studies No results for input(s): TSH, T4TOTAL, T3FREE, THYROIDAB in the last 72 hours.  Invalid input(s): FREET3 Anemia work up No results for input(s): VITAMINB12, FOLATE, FERRITIN, TIBC, IRON, RETICCTPCT in the last 72 hours. Urinalysis    Component Value Date/Time   COLORURINE YELLOW 01/22/2008 1947   APPEARANCEUR CLEAR 01/22/2008 1947   LABSPEC 1.031 (H) 01/22/2008 1947   PHURINE 6.0 01/22/2008 1947   GLUCOSEU NEGATIVE 01/22/2008 1947   HGBUR SMALL (A) 01/22/2008 1947   BILIRUBINUR NEGATIVE 01/22/2008 1947   KETONESUR NEGATIVE 01/22/2008 1947   PROTEINUR 30 (A) 01/22/2008 1947   UROBILINOGEN 1.0 01/22/2008 1947   NITRITE NEGATIVE 01/22/2008 1947   LEUKOCYTESUR NEGATIVE 01/22/2008 1947   Sepsis Labs Invalid input(s): PROCALCITONIN,  WBC,  LACTICIDVEN Microbiology Recent Results (from the past  240 hour(s))  MRSA PCR Screening     Status: None   Collection Time: 07/31/16 12:50 AM  Result Value Ref Range Status   MRSA by PCR NEGATIVE NEGATIVE Final    Comment:        The GeneXpert MRSA Assay (FDA approved for NASAL specimens only), is one component of a comprehensive MRSA colonization surveillance program. It is not intended to diagnose MRSA infection nor to guide or monitor  treatment for MRSA infections.      Time coordinating discharge: 45 minutes  SIGNED:  Dessa Phi, DO [facility] Pager [phone]  If 7PM-7AM, please contact night-coverage [email or web address] Password [facility] 08/02/2016, 2:43 PM

## 2016-08-02 NOTE — Discharge Instructions (Signed)
° ° °  Supplemental Discharge Instructions for  Pacemaker/Defibrillator Patients  Activity No heavy lifting or vigorous activity with your left/right arm for 6 to 8 weeks.  Do not raise your left/right arm above your head for one week.  Gradually raise your affected arm as drawn below.           __        08/06/16                        08/07/16                    08/08/16                  08/09/16   WOUND CARE - Keep the wound area clean and dry.  Do not get this area wet for one week. No showers for one week; you may shower on   08/09/16  . - The tape/steri-strips on your wound will fall off; do not pull them off.  No bandage is needed on the site.  DO  NOT apply any creams, oils, or ointments to the wound area. - If you notice any drainage or discharge from the wound, any swelling or bruising at the site, or you develop a fever > 101? F after you are discharged home, call the office at once.  Special Instructions - You are still able to use cellular telephones; use the ear opposite the side where you have your pacemaker/defibrillator.  Avoid carrying your cellular phone near your device. - When traveling through airports, show security personnel your identification card to avoid being screened in the metal detectors.  Ask the security personnel to use the hand wand. - Avoid arc welding equipment, MRI testing (magnetic resonance imaging), TENS units (transcutaneous nerve stimulators).  Call the office for questions about other devices. - Avoid electrical appliances that are in poor condition or are not properly grounded. - Microwave ovens are safe to be near or to operate.  Additional information for defibrillator patients should your device go off: - If your device goes off ONCE and you feel fine afterward, notify the device clinic nurses. - If your device goes off ONCE and you do not feel well afterward, call 911. - If your device goes off TWICE, call 911. - If your device goes off THREE  times in one day, call 911.  DO NOT DRIVE YOURSELF OR A FAMILY MEMBER WITH A DEFIBRILLATOR TO THE HOSPITAL--CALL 911.

## 2016-08-02 NOTE — Care Management Note (Signed)
Case Management Note Marvetta Gibbons RN, BSN Unit 2W-Case Manager [phone]  Patient Details  Name: David [name] MRN: [id number] Date of Birth: [birth year]  Subjective/Objective:  Pt admitted with VT, s/p ICD placement on 4/9                  Action/Plan: PTA pt lived at home- independent- plan to return home- no CM needs noted for discharge.   Expected Discharge Date:  08/02/16               Expected Discharge Plan:  Home/Self Care  In-House Referral:     Discharge planning Services  CM Consult  Post Acute Care Choice:  NA Choice offered to:  NA  DME Arranged:    DME Agency:     HH Arranged:    HH Agency:     Status of Service:  Completed, signed off  If discussed at [place] of Stay Meetings, dates discussed:    Discharge Disposition: home/self care   Additional Comments:  Dawayne Patricia, RN 08/02/2016, 10:13 AM

## 2016-08-02 NOTE — Progress Notes (Signed)
SUBJECTIVE: The patient is doing well today.  At this time, he denies chest pain, shortness of breath, or any new concerns.  CURRENT MEDICATIONS: . amiodarone  200 mg Oral TID  . aspirin  81 mg Oral Daily  . heparin  5,000 Units Subcutaneous Q8H  . ivabradine  2.5 mg Oral BID WC  . multivitamin with minerals  1 tablet Oral Daily  . ranolazine  500 mg Oral BID  . rosuvastatin  20 mg Oral q1800  . sodium chloride flush  3 mL Intravenous Q12H  . ticagrelor  90 mg Oral BID     OBJECTIVE: Physical Exam: Vitals:   08/01/16 1448 08/01/16 1530 08/01/16 2025 08/02/16 0503  BP: (!) 101/59 (!) 97/48 115/62 110/67  Pulse: 69 68  67  Resp:   17 17  Temp:   97.6 F (36.4 C) 97.6 F (36.4 C)  TempSrc:   Oral Oral  SpO2:   97% 100%  Weight:    161 lb 6 oz (73.2 kg)  Height:        Intake/Output Summary (Last 24 hours) at 08/02/16 0851 Last data filed at 08/01/16 1602  Gross per 24 hour  Intake                0 ml  Output              700 ml  Net             -700 ml    Telemetry reveals sinus rhythm (personally reviewed)  GEN- The patient is well appearing, alert and oriented x 3 today.   Head- normocephalic, atraumatic Eyes-  Sclera clear, conjunctiva pink Ears- hearing intact Oropharynx- clear Neck- supple  Lungs- Clear to ausculation bilaterally, normal work of breathing Heart- Regular rate and rhythm  GI- soft, NT, ND, + BS Extremities- no clubbing, cyanosis, or edema Skin- no rash or lesion, left chest with small hematoma  Psych- euthymic mood, full affect Neuro- strength and sensation are intact  LABS: Basic Metabolic Panel:  Recent Labs  08/01/16 0303 08/02/16 0424  NA 136 135  K 4.6 4.2  CL 101 103  CO2 26 20*  GLUCOSE 102* 96  BUN 15 15  CREATININE 2.37* 2.17*  CALCIUM 9.4 9.2  MG 2.1 2.2   CBC:  Recent Labs  07/31/16 0837 08/02/16 0424  WBC 14.6* 10.0  HGB 14.7 13.6  HCT 44.4 40.3  MCV 83.0 80.9  PLT 230 221    RADIOLOGY: Dg Chest  2 View Result Date: 08/02/2016 CLINICAL DATA:  Defibrillator placement EXAM: CHEST  2 VIEW COMPARISON:  07/27/2016 FINDINGS: Interval placement of left AICD with leads in the right atrium and right ventricle. No pneumothorax. Heart is borderline in size. No effusions or edema. No confluent opacities. No acute bony abnormality. IMPRESSION: Left AICD placement without pneumothorax. Electronically Signed   By: Rolm Baptise M.D.   On: 08/02/2016 07:17   ASSESSMENT AND PLAN:  Active Problems:   CAD (coronary artery disease), native coronary artery   Hypertension, essential   Chronic systolic HF (heart failure) (HCC)   Hypokalemia   Chest pain   VT (ventricular tachycardia) (HCC)   AKI (acute kidney injury) ([place])   Near syncope   PVC (premature ventricular contraction)  1.  Chronic systolic heart failure S/p dual chamber ICD 08/01/16.  CS unable to be cannulated despite prolonged attempts by both Dr Lovena Le and Dr Curt Bears CXR without ptx, leads in stable position Small hematoma,  Sulo Janczak apply pressure dressing Would recommend consideration of epicardial LV lead placement if Dr Haroldine Laws thinks patient could tolerate surgery.  If so, would refer to Dr Cyndia Bent  2.  NSVT Improved on amiodarone  Decrease to 200mg  twice daily at discharge for 4 weeks then 200mg  daily Joshva Labreck follow burden through device Keep K>3.8, Mg >1.8  3.  CAD No recent ischemic symptoms Continue current therapy  Routine wound care and follow up. I [place] Bynum see next week in clinic.   Chanetta Marshall, NP 08/02/2016 8:56 AM  I have seen and examined this patient with Chanetta Marshall.  Agree with above, note added to reflect my findings.  On exam, RRR, no murmurs, lungs clear, hematoma over device site. Pressure dressing placed over the device site due to enlarging hematoma. Chest x-ray shows no evidence of pneumothorax and device interrogation without issues. Unfortunately, unable to place the LV lead after multiple attempts by both me and  Dr. Lovena Le. Patient may be referred for an LV lead to be placed surgically in the future. Patient to follow-up in device clinic in 10 days for wound check.    Kinesha Auten M. Tiawanna Luchsinger MD 08/02/2016 10:20 AM

## 2016-08-02 NOTE — Progress Notes (Signed)
ICD Criteria  Current LVEF:25%. Within 12 months prior to implant: Yes   Heart failure history: Yes, Class II  Cardiomyopathy history: Yes, Ischemic Cardiomyopathy - Prior MI.  Atrial Fibrillation/Atrial Flutter: No.  Ventricular tachycardia history: Yes, Hemodynamic instability present. VT Type is unknown.  Cardiac arrest history: No.  History of syndromes with risk of sudden death: No.  Previous ICD: No.  Current ICD indication: Primary  PPM indication: No.   Class I or II Bradycardia indication present: No  Beta Blocker therapy for 3 or more months: Yes, prescribed.   Ace Inhibitor/ARB therapy for 3 or more months: Yes, prescribed.

## 2016-08-04 ENCOUNTER — Other Ambulatory Visit (HOSPITAL_COMMUNITY): Payer: Medicare HMO

## 2016-08-05 ENCOUNTER — Telehealth: Payer: Self-pay | Admitting: Internal Medicine

## 2016-08-05 NOTE — Telephone Encounter (Signed)
Reviewed conversation with eileen from [facility] and the steps that I had taken. She verbalized understanding

## 2016-08-05 NOTE — Telephone Encounter (Signed)
New message      Nurse from [facility] called to check on pt.  Patient c/o pain in defibrillator area.  Also nurse is concerned that no home health orders were ordered.  Please call wife [name]) at [phone] to address pain issue.

## 2016-08-05 NOTE — Telephone Encounter (Signed)
Spoke with patient's wife who expressed concerns regarding "lack" of pain control. She stated that she felt it was unacceptable to rely solely on extra strength tylenol as his pain reached "above a 10" last night. When assessing the frequency of the tylenol she stated that he had only been taking it when the site became painful. I explained that it can often times be difficult to regain control of pain when it begins to escalate and encouraged her to keep him on the scheduled dose of every 6hrs for the next couple of days to help manage this concern. She verbalized understanding. Patients wife additionally expressed concern that there was no home health nurse ordered to come assess the incision and make sure it was not getting infected. I explained that he was scheduled for a wound check appointment 4/18 with AS, NP and that the dressing would be removed and the incision would be assessed at that time. She verbalized understanding of this but continued to express disappointment with these "issues". I attempted to address all concerns but she stated that she would make sure her issues were addressed when she was in the office.

## 2016-08-05 NOTE — Telephone Encounter (Signed)
*  Wife stated that as of today his pain was being controlled

## 2016-08-09 ENCOUNTER — Ambulatory Visit (HOSPITAL_COMMUNITY)
Admit: 2016-08-09 | Discharge: 2016-08-09 | Disposition: A | Discharge status: Home / Self Care | Payer: Medicare HMO | Attending: Internal Medicine | Admitting: Internal Medicine

## 2016-08-09 ENCOUNTER — Encounter (HOSPITAL_COMMUNITY): Payer: Self-pay | Admitting: Internal Medicine

## 2016-08-09 VITALS — BP 124/78 | HR 73 | Wt 159.0 lb

## 2016-08-09 DIAGNOSIS — Z79899 Other long term (current) drug therapy: Secondary | ICD-10-CM | POA: Diagnosis not present

## 2016-08-09 DIAGNOSIS — I130 Hypertensive heart and chronic kidney disease with heart failure and stage 1 through stage 4 chronic kidney disease, or unspecified chronic kidney disease: Secondary | ICD-10-CM | POA: Insufficient documentation

## 2016-08-09 DIAGNOSIS — Z8249 Family history of ischemic heart disease and other diseases of the circulatory system: Secondary | ICD-10-CM | POA: Insufficient documentation

## 2016-08-09 DIAGNOSIS — I2510 Atherosclerotic heart disease of native coronary artery without angina pectoris: Secondary | ICD-10-CM | POA: Diagnosis not present

## 2016-08-09 DIAGNOSIS — Z825 Family history of asthma and other chronic lower respiratory diseases: Secondary | ICD-10-CM | POA: Insufficient documentation

## 2016-08-09 DIAGNOSIS — Z888 Allergy status to other drugs, medicaments and biological substances status: Secondary | ICD-10-CM | POA: Diagnosis not present

## 2016-08-09 DIAGNOSIS — E785 Hyperlipidemia, unspecified: Secondary | ICD-10-CM | POA: Insufficient documentation

## 2016-08-09 DIAGNOSIS — R42 Dizziness and giddiness: Secondary | ICD-10-CM | POA: Diagnosis not present

## 2016-08-09 DIAGNOSIS — I252 Old myocardial infarction: Secondary | ICD-10-CM | POA: Diagnosis not present

## 2016-08-09 DIAGNOSIS — I255 Ischemic cardiomyopathy: Secondary | ICD-10-CM | POA: Diagnosis not present

## 2016-08-09 DIAGNOSIS — I480 Paroxysmal atrial fibrillation: Secondary | ICD-10-CM | POA: Diagnosis not present

## 2016-08-09 DIAGNOSIS — R001 Bradycardia, unspecified: Secondary | ICD-10-CM | POA: Diagnosis not present

## 2016-08-09 DIAGNOSIS — I493 Ventricular premature depolarization: Secondary | ICD-10-CM

## 2016-08-09 DIAGNOSIS — R0602 Shortness of breath: Secondary | ICD-10-CM | POA: Diagnosis not present

## 2016-08-09 DIAGNOSIS — Z833 Family history of diabetes mellitus: Secondary | ICD-10-CM | POA: Insufficient documentation

## 2016-08-09 DIAGNOSIS — N183 Chronic kidney disease, stage 3 (moderate): Secondary | ICD-10-CM | POA: Insufficient documentation

## 2016-08-09 DIAGNOSIS — Z87891 Personal history of nicotine dependence: Secondary | ICD-10-CM | POA: Diagnosis not present

## 2016-08-09 DIAGNOSIS — I447 Left bundle-branch block, unspecified: Secondary | ICD-10-CM | POA: Insufficient documentation

## 2016-08-09 DIAGNOSIS — Z7982 Long term (current) use of aspirin: Secondary | ICD-10-CM | POA: Insufficient documentation

## 2016-08-09 DIAGNOSIS — I5022 Chronic systolic (congestive) heart failure: Secondary | ICD-10-CM | POA: Insufficient documentation

## 2016-08-09 MED ORDER — CARVEDILOL 3.125 MG PO TABS: 3.1250 mg | ORAL_TABLET | Freq: Two times a day (BID) | ORAL | 3 refills | Status: DC

## 2016-08-09 NOTE — Progress Notes (Signed)
Advanced Heart Failure Clinic Note   Primary Cardiologist : Dr. Geraldo [name] [facility]) Primary HF: Dr. Haroldine [name]   HPI:  David [name] is a 51 y.o. male w PMH of ICM ('09 PCI to LAD, mRCA, '10 PCI to Lcx and '12 mRCA), HFrEF (EF ~20% for > 5 years), HLD and HTN who presented to [facility] on 04/29/16 after ~ 2 weeks of worsening SOB, DOE, and orthopnea.   Admitted to [facility] ED 04/29/16 for Afib/AFL. Had Brady/PEA arrest, was intubated, started on pressors and transferred to [facility]. Pressors weaned as tolerated. Extubated 05/01/16. Repeat Echo showed LVEF 15-20%.   Cath on 05/05/16 (full report below) with 3V CAD and high grade lesion RCA and LAD. PCWP 37 so diuresed for optimization prior to PCI.  Pt underwent successful stenting of the proximal to mid RCA and PLOM with DES x 2 and successful stenting of the mid LAD with DES 05/09/16. Placed on ASA and Brilinta for 30 days, and then to transition off ASA and onto Eliquis/Xarelto.   Pt noted to have 2 episodes of bradycardia into the 20s with lightheadedness s/p cath. No further episodes after several hours. No intervention required. EP consulted to consider ICD with brady episodes. Felt like more vagal induced and deferred CRT-D consideration for outpatient. Discharge weight 152 lbs.  Admitted 07/27/16-08/02/16 for palpitations and weakness. Found to have frequent PVCs. Ranexa added to amiodarone. ICD placed - unable to get LV lead placed. Has been referred for epicardial lead with Dr. Cyndia Bent Digoxin and spiro stopped due to worsening renal function.   David [name] presents today for HF follow up. Here with his wife.Weights at home 154-155. Says he feels great. No SOB with walking around the grocery store, walks around the neighborhood several times a day without SOB. Following a low sodium diet, drinking less than 2L a day. Taking all medications with compliance. No orthopnea, PND. Denies chest pain or palpitations.   RHC/LHC 05/06/2016    Prox RCA to Mid RCA lesion, 80 %stenosed.  Mid RCA lesion, 40 %stenosed.  Dist RCA lesion, 20 %stenosed.  Post Atrio lesion, 80 %stenosed.  Ost Cx to Mid Cx lesion, 40 %stenosed.  Mid LAD to Dist LAD lesion, 70 %stenosed.  Dist LAD lesion, 80 %stenosed. Findings: Ao = 101/75 (87) LV = 102/35 RA = 8 RV = 62/10 PA = 65/34 (47) PCW = 37 (v = 45) Fick cardiac output/index = 3.8/2.1 PVR = 2.9 WU SVR = 1555 FA sat = 96% PA sat = 60%. 61%  Past Medical History:  Diagnosis Date  . CAD (coronary artery disease) 2009   AMI in 12/2007 with PCI to LAD, staged PCI to  mid/distal RCA, NSTEMI in 02/2009 with BMS to LCx  . CHF (congestive heart failure) ([place])   . HLD (hyperlipidemia)   . HTN (hypertension)   . Ischemic cardiomyopathy    Admitted in 07/2010 with CHF exacerbation    Current Outpatient Prescriptions  Medication Sig Dispense Refill  . amiodarone (PACERONE) 200 MG tablet Take 1 tablet (200 mg total) by mouth 2 (two) times daily. 60 tablet 0  . aspirin 81 MG chewable tablet Chew 1 tablet (81 mg total) by mouth daily. 30 tablet 6  . furosemide (LASIX) 20 MG tablet Take 20mg  as needed for 3-4 pound weight gain in 24 hours 30 tablet 0  . ivabradine (CORLANOR) 5 MG TABS tablet Take 0.5 tablets (2.5 mg total) by mouth 2 (two) times daily with a meal. 60 tablet 0  .  Multiple Vitamins-Minerals (ADULT GUMMY) CHEW Chew 2 tablets by mouth daily.    . ranolazine (RANEXA) 500 MG 12 hr tablet Take 1 tablet (500 mg total) by mouth 2 (two) times daily. 60 tablet 0  . ticagrelor (BRILINTA) 90 MG TABS tablet Take 1 tablet (90 mg total) by mouth 2 (two) times daily. 60 tablet 6  . nitroGLYCERIN (NITROSTAT) 0.4 MG SL tablet Place 1 tablet (0.4 mg total) under the tongue every 5 (five) minutes x 3 doses as needed for chest pain. (Patient not taking: Reported on 08/09/2016) 30 tablet 0   No current facility-administered medications for this encounter.     Allergies  Allergen Reactions   . Plavix [Clopidogrel Bisulfate] Hives      Social History   Social History  . Marital status: Married    Spouse name: N/A  . Number of children: N/A  . Years of education: N/A   Occupational History  . Not on file.   Social History Main Topics  . Smoking status: Former Smoker    Packs/day: 0.20    Years: 30.00    Types: Cigars    Quit date: 04/29/2016  . Smokeless tobacco: Never Used     Comment: Used to smoke cigarettes. Pt is currently smoking 3 black & mild cigars per day. He has cut down but never quit.  . Alcohol use No  . Drug use: No  . Sexual activity: Not on file   Other Topics Concern  . Not on file   Social History Narrative  . No narrative on file      Family History  Problem Relation Age of Onset  . Coronary artery disease Father   . Hypertension Father   . Hypertension Sister   . Coronary artery disease Brother   . Emphysema Brother   . Coronary artery disease Mother   . Hypertension Mother   . Emphysema Mother   . Hypertension Daughter   . Obesity Daughter   . Diabetes    . Hypertension    . Coronary artery disease      Vitals:   08/09/16 1416  BP: 124/78  Pulse: 73  SpO2: 99%  Weight: 159 lb (72.1 kg)   Wt Readings from Last 3 Encounters:  08/09/16 159 lb (72.1 kg)  08/02/16 161 lb 6 oz (73.2 kg)  07/19/16 153 lb 9.6 oz (69.7 kg)     PHYSICAL EXAM: General: Well appearing. No resp difficulty. HEENT: normal Neck: supple. JVD 5-6. Carotids 2+ bilat; no bruits. No thyromegaly or nodule noted. Cor: PMI nondisplaced. RRR, No M/G/R noted. ICD on left chest. With overlying hematoma.  Lungs: CTAB, normal effort. Abdomen: soft, non-tender, distended, no HSM. No bruits or masses. +BS  Extremities: no cyanosis, clubbing, rash, R and LLE no edema.  Neuro: alert & orientedx3, cranial nerves grossly intact. moves all 4 extremities w/o difficulty. Affect pleasant   ASSESSMENT & PLAN:  1. Chronic systolic HF - Echo 08/25/75 LVEF 15-20%  2/2 ICM. - Overall improving. NYHA II - Volume status looks good.  - Continue lasix 20mg  prn weight gain.  - No ACE/ARB/ARNI with CKD  - No spiro or digoxin with CKD.  - On ivabradine 2.5mg  daily. Continue same.  - Start Coreg 3.125mg  BID. Caj stop if he doesn't tolerate - Could not tolerate Bidil, caused hypotension.  2. CAD s/p PCI to mid RCA and PLOM with DES x 2 and DES to mid LAD in 1/18.  - Continue ASA and Brilinta  3.  PAF - Maintaining NSR. Continue Amiodarone.  - Previously discussed stopping ASA and starting Eliquis 30-days post-stent. However with recent bleeding will hold for now.  4. CKD stage III - Last creatinine 2.1-2.2 - No nephrotoxic medications as above.  5. Tobacco abuse. - Continues to smoke a few cigars a day.  6. LBBB - Failed attempt at 3rd lead placement - Seeing Dr. Cyndia Bent in May for consideration of epicardial lead.  7. Frequent PVCs - Improved with Ranexa and [place], NP 08/09/16  2:22 PM  Patient seen and examined with Jettie Booze, NP. We discussed all aspects of the encounter. I agree with the assessment and plan as stated above.   He is stable from HF perspective. NYHA II symptoms despite soft BP. Volume status looks ok. Will try low dose b-blocker. No ACE/ARB with CKD. PVCs have been suppressed on amio and Renxa. He is now s/p ICD. Unable to place LV lead percutaneously. Being referred to Dr. Cyndia Bent for placement of epicardial lead. However will need to be off Brilinta prior to surgery and would probably wait 6 months post stent for this (July 2018). Will eventually need to start [facility] for PAF.   Glori Bickers, MD  5:54 PM

## 2016-08-09 NOTE — Progress Notes (Signed)
Electrophysiology Office Note Date: 08/10/2016  ID:  David [name], DOB 1966/01/11, MRN [id number]  PCP: Gilford Rile, MD Primary Cardiologist: [place] Electrophysiologist: Caryl Comes  CC: HF follow up  David [name] is a 51 y.o. male seen today for Dr Caryl Comes.  He was recently hospitalized with pre-syncope and VT. He underwent attempted CRT implant, but LV lead was unable to be placed 2/2 difficult anatomy.  Since discharge, he reports doing very well. He had some incisional pain that was not well controlled with Tylenol.   He denies chest pain, palpitations, dyspnea, PND, orthopnea, nausea, vomiting, dizziness, syncope, edema, weight gain, or early satiety.  He has not had ICD shocks.   Device History: MDT dual chamber ICD implanted 2018 for ICM, VT (LV lead unable to be placed, pt has CRT can) History of appropriate therapy: No History of AAD therapy: Yes   Past Medical History:  Diagnosis Date  . CAD (coronary artery disease) 2009   AMI in 12/2007 with PCI to LAD, staged PCI to  mid/distal RCA, NSTEMI in 02/2009 with BMS to LCx  . CHF (congestive heart failure) ([place])   . HLD (hyperlipidemia)   . HTN (hypertension)   . Ischemic cardiomyopathy    Admitted in 07/2010 with CHF exacerbation   Past Surgical History:  Procedure Laterality Date  . BIV ICD INSERTION CRT-D N/A 08/01/2016   Procedure: BiV ICD Insertion CRT-D;  Surgeon: Will Meredith Leeds, MD;  Location: [facility];  Service: Cardiovascular;  Laterality: N/A;  . CARDIAC CATHETERIZATION N/A 05/05/2016   Procedure: Right/Left Heart Cath and Coronary Angiography;  Surgeon: Jolaine Artist, MD;  Location: [facility];  Service: Cardiovascular;  Laterality: N/A;  . CARDIAC CATHETERIZATION N/A 05/09/2016   Procedure: Coronary Stent Intervention;  Surgeon: Peter M Martinique, MD;  Location: [place] [facility];  Service: Cardiovascular;  Laterality: N/A;  . CARDIAC CATHETERIZATION N/A 05/09/2016   Procedure:  Intravascular Pressure Wire/FFR Study;  Surgeon: Peter M Martinique, MD;  Location: [place] [facility];  Service: Cardiovascular;  Laterality: N/A;  . ELECTROPHYSIOLOGY STUDY N/A 06/24/2016   Procedure: Electrophysiology Study;  Surgeon: Deboraha Sprang, MD;  Location: [place] [facility];  Service: Cardiovascular;  Laterality: N/A;  . ICD IMPLANT N/A 06/24/2016   Procedure: POSSIBLE  ICD Implant;  Surgeon: Deboraha Sprang, MD;  Location: [place] [facility];  Service: Cardiovascular;  Laterality: N/A;  . LEFT HEART CATHETERIZATION WITH CORONARY ANGIOGRAM N/A 03/13/2011   Procedure: LEFT HEART CATHETERIZATION WITH CORONARY ANGIOGRAM;  Surgeon: Lorretta Harp, MD;  Location: [facility] C[facility] LAB;  Service: Cardiovascular;  Laterality: N/A;    Current Outpatient Prescriptions  Medication Sig Dispense Refill  . amiodarone (PACERONE) 200 MG tablet Take 1 tablet (200 mg total) by mouth 2 (two) times daily. 60 tablet 0  . aspirin 81 MG chewable tablet Chew 1 tablet (81 mg total) by mouth daily. 30 tablet 6  . carvedilol (COREG) 3.125 MG tablet Take 1 tablet (3.125 mg total) by mouth 2 (two) times daily. 60 tablet 3  . furosemide (LASIX) 20 MG tablet Take 20mg  as needed for 3-4 pound weight gain in 24 hours 30 tablet 0  . ivabradine (CORLANOR) 5 MG TABS tablet Take 0.5 tablets (2.5 mg total) by mouth 2 (two) times daily with a meal. 60 tablet 0  . Multiple Vitamins-Minerals (ADULT GUMMY) CHEW Chew 2 tablets by mouth daily.    . ranolazine (RANEXA) 500 MG 12 hr tablet Take 1 tablet (500 mg  total) by mouth 2 (two) times daily. 60 tablet 0  . ticagrelor (BRILINTA) 90 MG TABS tablet Take 1 tablet (90 mg total) by mouth 2 (two) times daily. 60 tablet 6   No current facility-administered medications for this visit.     Allergies:   Plavix [clopidogrel bisulfate]   Social History: Social History   Social History  . Marital status: Married    Spouse name: N/A  . Number of children: N/A  . Years of education: N/A    Occupational History  . Not on file.   Social History Main Topics  . Smoking status: Former Smoker    Packs/day: 0.20    Years: 30.00    Types: Cigars    Quit date: 04/29/2016  . Smokeless tobacco: Never Used     Comment: Used to smoke cigarettes. Pt is currently smoking 3 black & mild cigars per day. He has cut down but never quit.  . Alcohol use No  . Drug use: No  . Sexual activity: Not on file   Other Topics Concern  . Not on file   Social History Narrative  . No narrative on file    Family History: Family History  Problem Relation Age of Onset  . Coronary artery disease Father   . Hypertension Father   . Hypertension Sister   . Coronary artery disease Brother   . Emphysema Brother   . Coronary artery disease Mother   . Hypertension Mother   . Emphysema Mother   . Hypertension Daughter   . Obesity Daughter   . Diabetes    . Hypertension    . Coronary artery disease      Review of Systems: All other systems reviewed and are otherwise negative except as noted above.   Physical Exam: VS:  BP 100/70   Pulse 72   Ht 5\' 5"  (1.651 m)   Wt 160 lb (72.6 kg)   BMI 26.63 kg/m  , BMI Body mass index is 26.63 kg/m.  GEN- The patient is well appearing, alert and oriented x 3 today.   HEENT: normocephalic, atraumatic; sclera clear, conjunctiva pink; hearing intact; oropharynx clear; neck supple  Lungs- Clear to ausculation bilaterally, normal work of breathing.  No wheezes, rales, rhonchi Heart- Regular rate and rhythm  GI- soft, non-tender, non-distended, bowel sounds present  Extremities- no clubbing, cyanosis, or edema  MS- no significant deformity or atrophy Skin- warm and dry, no rash or lesion; ICD pocket well healed, +small hematoma Psych- euthymic mood, full affect Neuro- strength and sensation are intact  ICD interrogation- reviewed in detail today,  See PACEART report  EKG:  EKG is ordered today. The ekg ordered today shows sinus rhythm, LBBB, QRS  130msec  Recent Labs: 04/30/2016: ALT 149 07/27/2016: B Natriuretic Peptide 321.6 07/28/2016: TSH 2.148 08/02/2016: BUN 15; Creatinine, Ser 2.17; Hemoglobin 13.6; Magnesium 2.2; Platelets 221; Potassium 4.2; Sodium 135   Wt Readings from Last 3 Encounters:  08/10/16 160 lb (72.6 kg)  08/09/16 159 lb (72.1 kg)  08/02/16 161 lb 6 oz (73.2 kg)     Other studies Reviewed: Additional studies/ records that were reviewed today include: hospital records   Assessment and Plan:  1.  Chronic systolic dysfunction euvolemic today Stable on an appropriate medical regimen Normal ICD function See [facility] report No changes today Follow up with Dr Cyndia Bent as scheduled to discuss epicardial LV lead placement (08/24/16)  2.  Ventricular tachycardia No sustained episodes since discharge Continue amiodarone 200mg  twice daily  for now. Decrease to 200mg  daily at next visit with AHF Continue Ranexa No driving  3.  CAD No recent ischemic symptoms Continue current therapy    Current medicines are reviewed at length with the patient today.   The patient does not have concerns regarding his medicines.  The following changes were made today:  none  Labs/ tests ordered today include:  Orders Placed This Encounter  Procedures  . CUP PACEART INCLINIC DEVICE CHECK     Disposition:   Follow up with Dr Caryl Comes 3 months, AHF clinic as scheduled      Signed, Chanetta Marshall, NP 08/10/2016 3:03 PM  [place] [address] [place] [address] [phone] (office) [phone] (fax)

## 2016-08-09 NOTE — Patient Instructions (Signed)
START taking Coreg 3.125 mg (1 Tablet) Two Times Daily  Follow up in 4-6 weeks.

## 2016-08-10 ENCOUNTER — Ambulatory Visit (INDEPENDENT_AMBULATORY_CARE_PROVIDER_SITE_OTHER): Payer: Medicare HMO | Admitting: Nurse Practitioner

## 2016-08-10 ENCOUNTER — Encounter: Payer: Self-pay | Admitting: Nurse Practitioner

## 2016-08-10 VITALS — BP 100/70 | HR 72 | Ht 65.0 in | Wt 160.0 lb

## 2016-08-10 DIAGNOSIS — I472 Ventricular tachycardia, unspecified: Secondary | ICD-10-CM

## 2016-08-10 DIAGNOSIS — I5022 Chronic systolic (congestive) heart failure: Secondary | ICD-10-CM

## 2016-08-10 DIAGNOSIS — I2510 Atherosclerotic heart disease of native coronary artery without angina pectoris: Secondary | ICD-10-CM

## 2016-08-10 LAB — CUP PACEART INCLINIC DEVICE CHECK
Implantable Lead Implant Date: 20180409
Implantable Lead Location: 753859
Implantable Pulse Generator Implant Date: 20180409
MDC IDC LEAD IMPLANT DT: 20180409
MDC IDC LEAD LOCATION: 753860
MDC IDC SESS DTM: 20180418124418

## 2016-08-10 NOTE — Patient Instructions (Signed)
Medication Instructions:  None Ordered   Labwork: None Ordered   Testing/Procedures: None Ordered   Follow-Up: Your physician recommends that you schedule a follow-up appointment in: 3 months with Dr. Caryl Comes  Any Other Special Instructions Will Be Listed Below (If Applicable).     If you need a refill on your cardiac medications before your next appointment, please call your pharmacy.

## 2016-08-24 ENCOUNTER — Encounter: Payer: Self-pay | Admitting: Surgery

## 2016-08-24 ENCOUNTER — Institutional Professional Consult (permissible substitution) (INDEPENDENT_AMBULATORY_CARE_PROVIDER_SITE_OTHER): Payer: Medicare HMO | Admitting: Surgery

## 2016-08-24 VITALS — BP 118/75 | HR 69 | Resp 16 | Ht 65.0 in | Wt 156.0 lb

## 2016-08-24 DIAGNOSIS — I255 Ischemic cardiomyopathy: Secondary | ICD-10-CM | POA: Diagnosis not present

## 2016-08-24 DIAGNOSIS — I454 Nonspecific intraventricular block: Secondary | ICD-10-CM

## 2016-08-24 DIAGNOSIS — Z95810 Presence of automatic (implantable) cardiac defibrillator: Secondary | ICD-10-CM | POA: Diagnosis not present

## 2016-08-25 ENCOUNTER — Encounter: Payer: Self-pay | Admitting: Surgery

## 2016-08-25 NOTE — Progress Notes (Signed)
Cardiothoracic Surgery Consultation  PCP is Gilford Rile, MD Referring Provider is Evans Lance, MD  Chief Complaint  Patient presents with  . Referral    for epicardial lead placement d/t  previous unsuccessful attempts by cardiology    HPI:  The patient is a 51 year old gentleman with hypertension, hyperlipidemia, CAD s/p multiple PCI/Stents and ischemic cardiomyopathy with an EF of 15-20% by echo 05/02/2016 when he presented to [facility] with a two week history of progressive shortness of breath and orthopnea. He was in afib/flutter and suffered a brady/PEA arrest, was intubated and started on pressors and transferred to [facility]. He was weaned off pressors and extubated and underwent cath and PCI with DES x 2 to the RCA and DES to the mid LAD. He has been on DAPT with Brilinta since. He was admitted in early April for palpitations and weakness and found to have frequent PVC's. An ICD was placed but unable to place an LV lead. Echo at that time showed an EF of 25%. He has been followed by Dr. Haroldine Laws. The patient says that he feels well now. He is walking with no shortness of breath as long as he takes his time. He denies fatigue and chest discomfort. He denies orthopnea and peripheral edema.   Past Medical History:  Diagnosis Date  . CAD (coronary artery disease) 2009   AMI in 12/2007 with PCI to LAD, staged PCI to  mid/distal RCA, NSTEMI in 02/2009 with BMS to LCx  . CHF (congestive heart failure) ([place])   . HLD (hyperlipidemia)   . HTN (hypertension)   . Ischemic cardiomyopathy    Admitted in 07/2010 with CHF exacerbation    Past Surgical History:  Procedure Laterality Date  . BIV ICD INSERTION CRT-D N/A 08/01/2016   Procedure: BiV ICD Insertion CRT-D;  Surgeon: Will Meredith Leeds, MD;  Location: [place] [facility];  Service: Cardiovascular;  Laterality: N/A;  . CARDIAC CATHETERIZATION N/A 05/05/2016   Procedure: Right/Left Heart Cath and Coronary Angiography;  Surgeon: Jolaine Artist,  MD;  Location: [place] [facility];  Service: Cardiovascular;  Laterality: N/A;  . CARDIAC CATHETERIZATION N/A 05/09/2016   Procedure: Coronary Stent Intervention;  Surgeon: Peter M Martinique, MD;  Location: [place] [facility];  Service: Cardiovascular;  Laterality: N/A;  . CARDIAC CATHETERIZATION N/A 05/09/2016   Procedure: Intravascular Pressure Wire/FFR Study;  Surgeon: Peter M Martinique, MD;  Location: [place] [facility];  Service: Cardiovascular;  Laterality: N/A;  . ELECTROPHYSIOLOGY STUDY N/A 06/24/2016   Procedure: Electrophysiology Study;  Surgeon: Deboraha Sprang, MD;  Location: [place] [facility];  Service: Cardiovascular;  Laterality: N/A;  . ICD IMPLANT N/A 06/24/2016   Procedure: POSSIBLE  ICD Implant;  Surgeon: Deboraha Sprang, MD;  Location: [place] [facility];  Service: Cardiovascular;  Laterality: N/A;  . LEFT HEART CATHETERIZATION WITH CORONARY ANGIOGRAM N/A 03/13/2011   Procedure: LEFT HEART CATHETERIZATION WITH CORONARY ANGIOGRAM;  Surgeon: Lorretta Harp, MD;  Location: [facility] CATH LAB;  Service: Cardiovascular;  Laterality: N/A;    Family History  Problem Relation Age of Onset  . Coronary artery disease Father   . Hypertension Father   . Hypertension Sister   . Coronary artery disease Brother   . Emphysema Brother   . Coronary artery disease Mother   . Hypertension Mother   . Emphysema Mother   . Hypertension Daughter   . Obesity Daughter   . Diabetes    . Hypertension    .  Coronary artery disease      Social History Social History  Substance Use Topics  . Smoking status: Former Smoker    Packs/day: 0.20    Years: 30.00    Types: Cigars    Quit date: 04/29/2016  . Smokeless tobacco: Never Used     Comment: Used to smoke cigarettes. Pt is currently smoking 3 black & mild cigars per day. He has cut down but never quit.  . Alcohol use No    Current Outpatient Prescriptions  Medication Sig Dispense Refill  . amiodarone (PACERONE) 200 MG tablet Take 1 tablet (200 mg total)  by mouth 2 (two) times daily. 60 tablet 0  . aspirin 81 MG chewable tablet Chew 1 tablet (81 mg total) by mouth daily. 30 tablet 6  . carvedilol (COREG) 3.125 MG tablet Take 1 tablet (3.125 mg total) by mouth 2 (two) times daily. 60 tablet 3  . furosemide (LASIX) 20 MG tablet Take 20mg  as needed for 3-4 pound weight gain in 24 hours 30 tablet 0  . ivabradine (CORLANOR) 5 MG TABS tablet Take 0.5 tablets (2.5 mg total) by mouth 2 (two) times daily with a meal. 60 tablet 0  . Multiple Vitamins-Minerals (ADULT GUMMY) CHEW Chew 2 tablets by mouth daily.    . ranolazine (RANEXA) 500 MG 12 hr tablet Take 1 tablet (500 mg total) by mouth 2 (two) times daily. 60 tablet 0  . ticagrelor (BRILINTA) 90 MG TABS tablet Take 1 tablet (90 mg total) by mouth 2 (two) times daily. 60 tablet 6   No current facility-administered medications for this visit.     Allergies  Allergen Reactions  . Plavix [Clopidogrel Bisulfate] Hives    Review of Systems  Constitutional: Negative for activity change, appetite change, fatigue, fever and unexpected weight change.  HENT: Negative.        Has not been to dentist in years. No pain in teeth or jaw.  Eyes: Negative.   Respiratory:       Shortness of breath with heavy exertion.  Cardiovascular: Negative for chest pain, palpitations and leg swelling.  Gastrointestinal: Negative.   Endocrine: Negative.   Genitourinary: Negative.   Musculoskeletal: Negative.   Skin: Negative.   Neurological: Negative.   Hematological: Negative.   Psychiatric/Behavioral: Negative.     BP 118/75 (BP Location: Right Arm, Patient Position: Sitting, Cuff Size: Large)   Pulse 69   Resp 16   Ht 5\' 5"  (1.651 m)   Wt 156 lb (70.8 kg)   SpO2 98% Comment: ON RA  BMI 25.96 kg/m  Physical Exam  Constitutional: He is oriented to person, place, and time. He appears well-developed and well-nourished. No distress.  HENT:  Head: Normocephalic and atraumatic.  Mouth/Throat: Oropharynx is  clear and moist.  Poor dentition  Eyes: EOM are normal. Pupils are equal, round, and reactive to light.  Neck: Normal range of motion. Neck supple. No JVD present. No thyromegaly present.  Cardiovascular: Normal rate, regular rhythm, normal heart sounds and intact distal pulses.   No murmur heard. Pulmonary/Chest: Effort normal and breath sounds normal. No respiratory distress. He has no wheezes. He has no rales.  ICD generator in left anterior chest wall. Incision healed with no sign of infection.  Abdominal: Soft. Bowel sounds are normal. He exhibits no distension. There is no tenderness.  Musculoskeletal: Normal range of motion. He exhibits no edema.  Lymphadenopathy:    He has no cervical adenopathy.  Neurological: He is alert and oriented to person,  place, and time. He has normal strength. No cranial nerve deficit or sensory deficit.  Skin: Skin is warm and dry.  Psychiatric: He has a normal mood and affect.    Diagnostic Tests:         *[facility]*                         [address] [place], [place] [address]                            [phone]  ------------------------------------------------------------------- Transthoracic Echocardiography  Patient:    David, [name] MR #:       [id number] Study Date: 07/29/2016 Gender:     M Age:        22 Height:     165.1 cm Weight:     70.3 kg BSA:        1.81 m^2 Pt. Status: Room:       [id number]   PERFORMING   [facility], Inpatient  ATTENDING    Selassie Dalton, Renee Cala Bradford, Renee Jeani Hawking  ADMITTING    Vianne Bulls  SONOGRAPHER  Chelsea Androw  cc:  ------------------------------------------------------------------- LV EF: 25%  ------------------------------------------------------------------- Indications:      Cardiomyopathy - ischemic  414.8.  ------------------------------------------------------------------- History:   PMH:  Chest pain STEMI  Coronary artery disease. Congestive heart failure.  Risk factors:  Hypertension. Dyslipidemia.  ------------------------------------------------------------------- Study Conclusions  - Left ventricle: The cavity size was severely dilated. Wall   thickness was normal. The estimated ejection fraction was 25%.   Diffuse hypokinesis. Left ventricular diastolic function   parameters were normal. - Aortic valve: There was mild regurgitation. - Mitral valve: There was mild regurgitation. - Left atrium: The atrium was moderately dilated. - Atrial septum: No defect or patent foramen ovale was identified.  ------------------------------------------------------------------- Study data:  Comparison was made to the study of 05/02/2016.  Study status:  Routine.  Procedure:  The patient reported no pain pre or post test. Transthoracic echocardiography. Image quality was adequate.  Study completion:  There were no complications. Transthoracic echocardiography.  M-mode, complete 2D, spectral Doppler, and color Doppler.  Birthdate:  Patient birthdate: Apr 01, 1966.  Age:  Patient is 51 yr old.  Sex:  Gender: male. BMI: 25.8 kg/m^2.  Blood pressure:     93/58  Patient status: Inpatient.  Study date:  Study date: 07/29/2016. Study time: 12:46 PM.  Location:  Bedside.  -------------------------------------------------------------------  ------------------------------------------------------------------- Left ventricle:  The cavity size was severely dilated. Wall thickness was normal. The estimated ejection fraction was 25%. Diffuse hypokinesis. The transmitral flow pattern was normal. The deceleration time of the early transmitral flow velocity was normal. The pulmonary vein flow pattern was normal. The tissue Doppler parameters were normal. Left ventricular diastolic  function parameters were normal.  ------------------------------------------------------------------- Aortic valve:   Trileaflet.  Doppler:   There was no stenosis. There was mild regurgitation.  ------------------------------------------------------------------- Aorta:  The aorta was normal, not dilated, and non-diseased.  ------------------------------------------------------------------- Mitral valve:   Doppler:  There was mild regurgitation.    Peak gradient (D): 3 mm Hg.  -------------------------------------------------------------------  Left atrium:  The atrium was moderately dilated.  ------------------------------------------------------------------- Atrial septum:  No defect or patent foramen ovale was identified.   ------------------------------------------------------------------- Pulmonic valve:    Doppler:  There was trivial regurgitation.  ------------------------------------------------------------------- Tricuspid valve:   Doppler:  There was mild regurgitation.  ------------------------------------------------------------------- Right atrium:  The atrium was normal in size.  ------------------------------------------------------------------- Pericardium:  The pericardium was normal in appearance.  ------------------------------------------------------------------- Systemic veins: Inferior vena cava: The vessel was normal in size. The respirophasic diameter changes were in the normal range (>= 50%), consistent with normal central venous pressure.  ------------------------------------------------------------------- Post procedure conclusions Ascending Aorta:  - The aorta was normal, not dilated, and non-diseased.  ------------------------------------------------------------------- Measurements   Left ventricle                            Value        Reference  Stroke volume, 2D                         60    ml     ----------  Stroke  volume/bsa, 2D                     33    ml/m^2 ----------  LV end-diastolic volume, 1-p H4L          291   ml     ----------  LV end-systolic volume, 1-p P3X           230   ml     ----------  LV ejection fraction, 1-p A4C             24    %      ----------  LV end-diastolic volume/bsa, 1-p T0W      161   ml/m^2 ----------  LV end-systolic volume/bsa, 1-p I0X       127   ml/m^2 ----------  LV end-diastolic volume, 2-p              291   ml     ----------  LV end-systolic volume, 2-p               228   ml     ----------  LV ejection fraction, 2-p                 22    %      ----------  Stroke volume, 2-p                        63    ml     ----------  LV end-diastolic volume/bsa, 2-p          161   ml/m^2 ----------  LV end-systolic volume/bsa, 2-p           126   ml/m^2 ----------  Stroke volume/bsa, 2-p                    34.8  ml/m^2 ----------  LV e&', lateral                            7.48  cm/s   ----------  LV E/e&', lateral                          11.8         ----------  LV e&', medial                             5.12  cm/s   ----------  LV E/e&', medial                           17.25        ----------  LV e&', average                            6.3   cm/s   ----------  LV E/e&', average                          14.02        ----------    LVOT                                      Value        Reference  LVOT ID, S                                18    mm     ----------  LVOT area                                 2.54  cm^2   ----------  LVOT peak velocity, S                     124   cm/s   ----------  LVOT mean velocity, S                     87.9  cm/s   ----------  LVOT VTI, S                               23.5  cm     ----------  LVOT peak gradient, S                     6     mm Hg  ----------    Aortic valve                              Value        Reference  Aortic regurg pressure half-time          340   ms     ----------    Aorta                                      Value        Reference  Aortic root ID, ED                        33    mm     ----------    Left atrium  Value        Reference  LA ID, A-P, ES                            48    mm     ----------  LA ID/bsa, A-P                       (H)  2.65  cm/m^2 <=2.2  LA volume, S                              74.3  ml     ----------  LA volume/bsa, S                          41.1  ml/m^2 ----------  LA volume, ES, 1-p A4C                    82.4  ml     ----------  LA volume/bsa, ES, 1-p A4C                45.6  ml/m^2 ----------  LA volume, ES, 1-p A2C                    65.7  ml     ----------  LA volume/bsa, ES, 1-p A2C                36.3  ml/m^2 ----------    Mitral valve                              Value        Reference  Mitral E-wave peak velocity               88.3  cm/s   ----------  Mitral A-wave peak velocity               66.9  cm/s   ----------  Mitral deceleration time                  158   ms     150 - 230  Mitral peak gradient, D                   3     mm Hg  ----------  Mitral E/A ratio, peak                    1.3          ----------    Right atrium                              Value        Reference  RA ID, S-I, ES, A4C                       37.2  mm     34 - 49  RA area, ES, A4C                          12.4  cm^2   8.3 - 19.5  RA volume, ES, A/L  34.1  ml     ----------  RA volume/bsa, ES, A/L                    18.9  ml/m^2 ----------    Systemic veins                            Value        Reference  Estimated CVP                             15    mm Hg  ----------    Right ventricle                           Value        Reference  TAPSE                                     23.5  mm     ----------  RV s&', lateral, S                         11.6  cm/s   ----------  Legend: (L)  and  (H)  mark values outside specified reference  range.  ------------------------------------------------------------------- Prepared and Electronically Authenticated by  Jenkins Rouge, M.D. 2018-04-06T14:26:39    ECG from 08/10/2016 shows sinus rhythm with 1st degree AV block, LBBB, QRS 156 ms.   Impression:  He has ischemic cardiomyopathy with an EF of 25%, ECG with LBBB with QRS 156 ms and has an ICD in place but could not insert a transvenous LV lead. He is doing well but I agree with benefit of a BiV ICD for this patient. He had 3 DES placed on 05/09/2016 so he needs to stay on Brilinta for 6 months before stopping it for surgical placement of an LV epicardial lead. He is clinically stable so waiting until July to do surgery should be fine. I discussed the surgical procedure with the patient and his wife including alternative of no LV lead, benefits and risk including but not limited to bleeding, blood transfusion, infection and possible need for removal of the ICD system if it got infected, injury to the heart and system malfunction in the future requiring revision. He understands and agrees to proceed.  Plan:  We will schedule for insertion of an LV epicardial pacing lead via left mini-thoracotomy and revision of his ICD system to BiV in July 2018.   I spent 45 minutes performing this consultation and > 50% of this time was spent face to face counseling and coordinating the care of this patient's ischemic cardiomyopathy.   Gaye Pollack, MD [facility] [phone]

## 2016-09-02 ENCOUNTER — Other Ambulatory Visit: Payer: Self-pay | Admitting: *Deleted

## 2016-09-02 ENCOUNTER — Telehealth: Payer: Self-pay | Admitting: Internal Medicine

## 2016-09-02 MED ORDER — RANOLAZINE ER 500 MG PO TB12: 500.0000 mg | ORAL_TABLET | Freq: Two times a day (BID) | ORAL | 10 refills | Status: DC

## 2016-09-02 NOTE — Telephone Encounter (Signed)
°  New Prob   *STAT* If patient is at the pharmacy, call can be transferred to refill team.   1. Which medications need to be refilled? (please list name of each medication and dose if known) ranolazine (RANEXA) 500 MG 12 hr tablet  2. Which pharmacy/location (including street and city if local pharmacy) is medication to be sent to? [facility] in [place], [place]  3. Do they need a 30 day or 90 day supply? 30 days    PT WILL BE OUT OF THIS MEDICATION TONIGHT.

## 2016-09-02 NOTE — Telephone Encounter (Signed)
Patient was started on this recently, but by a hospital physician. Okay to refill under Dr Caryl Comes? Please advise. Thanks, MI

## 2016-09-02 NOTE — Telephone Encounter (Signed)
OK to refill Ranexa 500 mg BID.

## 2016-09-12 ENCOUNTER — Other Ambulatory Visit (HOSPITAL_COMMUNITY): Payer: Medicare HMO

## 2016-09-14 ENCOUNTER — Encounter (HOSPITAL_COMMUNITY): Payer: Self-pay

## 2016-09-14 ENCOUNTER — Ambulatory Visit (HOSPITAL_COMMUNITY)
Admission: RE | Admit: 2016-09-14 | Discharge: 2016-09-14 | Disposition: A | Discharge status: Home / Self Care | Payer: Medicare HMO | Source: Ambulatory Visit | Attending: Internal Medicine | Admitting: Internal Medicine

## 2016-09-30 ENCOUNTER — Ambulatory Visit: Payer: Medicare HMO | Admitting: Internal Medicine

## 2016-10-03 ENCOUNTER — Other Ambulatory Visit: Payer: Self-pay | Admitting: *Deleted

## 2016-10-03 DIAGNOSIS — I255 Ischemic cardiomyopathy: Secondary | ICD-10-CM

## 2016-11-02 NOTE — Pre-Procedure Instructions (Signed)
David [name]  11/02/2016      [facility], [place] [address] [place] [address] Phone: [phone] Fax: [phone]    Your procedure is scheduled on July 16  Report to [facility] at [place].M.  Call this number if you have problems the morning of surgery:  [phone]   Remember:  Do not eat food or drink liquids after midnight.   Take these medicines the morning of surgery with A SIP OF WATER amiodarone (PACERONE), carvedilol (COREG, ivabradine (CORLANOR), Nasal Spray,  ranolazine (RANEXA)  7 days prior to surgery STOP taking any Aleve, Naproxen, Ibuprofen, Motrin, Advil, Goody's, BC's, all herbal medications, fish oil, and all vitamins  Follow your doctors instructions regarding your Aspirin.  If no instructions were given by the doctor you will need to call the office to get instructions.  Your pre admission RN will also call for those instructions    Do not wear jewelry  Do not wear lotions, powders, or cologne, or deoderant.  Do not shave 48 hours prior to surgery.    Do not bring valuables to the hospital.  [facility] is not responsible for any belongings or valuables.  Contacts, dentures or bridgework may not be worn into surgery.  Leave your suitcase in the car.  After surgery it may be brought to your room.  For patients admitted to the hospital, discharge time will be determined by your treatment team.  Patients discharged the day of surgery will not be allowed to drive home.    Special instructions:   [place]- Preparing For Surgery  Before surgery, you can play an important role. Because skin is not sterile, your skin needs to be as free of germs as possible. You can reduce the number of germs on your skin by washing with CHG (chlorahexidine gluconate) Soap before surgery.  CHG is an antiseptic cleaner which kills germs and bonds with the skin to continue killing germs even after  washing.  Please do not use if you have an allergy to CHG or antibacterial soaps. If your skin becomes reddened/irritated stop using the CHG.  Do not shave (including legs and underarms) for at least 48 hours prior to first CHG shower. It is OK to shave your face.  Please follow these instructions carefully.   1. Shower the NIGHT BEFORE SURGERY and the MORNING OF SURGERY with CHG.   2. If you chose to wash your hair, wash your hair first as usual with your normal shampoo.  3. After you shampoo, rinse your hair and body thoroughly to remove the shampoo.  4. Use CHG as you would any other liquid soap. You can apply CHG directly to the skin and wash gently with a scrungie or a clean washcloth.   5. Apply the CHG Soap to your body ONLY FROM THE NECK DOWN.  Do not use on open wounds or open sores. Avoid contact with your eyes, ears, mouth and genitals (private parts). Wash genitals (private parts) with your normal soap.  6. Wash thoroughly, paying special attention to the area where your surgery will be performed.  7. Thoroughly rinse your body with warm water from the neck down.  8. DO NOT shower/wash with your normal soap after using and rinsing off the CHG Soap.  9. Pat yourself dry with a CLEAN TOWEL.   10. Wear CLEAN PAJAMAS   11. Place CLEAN SHEETS on your bed the  night of your first shower and DO NOT SLEEP WITH PETS.    Day of Surgery: Do not apply any deodorants/lotions. Please wear clean clothes to the hospital/surgery center.      Please read over the following fact sheets that you were given.

## 2016-11-03 ENCOUNTER — Other Ambulatory Visit: Payer: Self-pay

## 2016-11-03 ENCOUNTER — Encounter (HOSPITAL_COMMUNITY)
Admission: RE | Admit: 2016-11-03 | Discharge: 2016-11-03 | Disposition: A | Discharge status: Home / Self Care | Payer: Medicare HMO | Source: Ambulatory Visit | Attending: Surgery | Admitting: Surgery

## 2016-11-03 ENCOUNTER — Encounter (HOSPITAL_COMMUNITY): Payer: Self-pay

## 2016-11-03 DIAGNOSIS — I255 Ischemic cardiomyopathy: Secondary | ICD-10-CM | POA: Diagnosis not present

## 2016-11-03 DIAGNOSIS — Z01810 Encounter for preprocedural cardiovascular examination: Secondary | ICD-10-CM | POA: Diagnosis not present

## 2016-11-03 DIAGNOSIS — Z01812 Encounter for preprocedural laboratory examination: Secondary | ICD-10-CM | POA: Diagnosis present

## 2016-11-03 DIAGNOSIS — N189 Chronic kidney disease, unspecified: Secondary | ICD-10-CM

## 2016-11-03 DIAGNOSIS — R569 Unspecified convulsions: Secondary | ICD-10-CM

## 2016-11-03 HISTORY — DX: Unspecified convulsions: R56.9

## 2016-11-03 HISTORY — DX: Acute myocardial infarction, unspecified: I21.9

## 2016-11-03 HISTORY — DX: Chronic kidney disease, unspecified: N18.9

## 2016-11-03 HISTORY — DX: Presence of automatic (implantable) cardiac defibrillator: Z95.810

## 2016-11-03 LAB — COMPREHENSIVE METABOLIC PANEL
ALK PHOS: 83 U/L (ref 38–126)
ALT: 20 U/L (ref 17–63)
AST: 19 U/L (ref 15–41)
Albumin: 3.4 g/dL — ABNORMAL LOW (ref 3.5–5.0)
Anion gap: 11 (ref 5–15)
BILIRUBIN TOTAL: 0.7 mg/dL (ref 0.3–1.2)
BUN: 18 mg/dL (ref 6–20)
CALCIUM: 9.4 mg/dL (ref 8.9–10.3)
CO2: 20 mmol/L — AB (ref 22–32)
CREATININE: 1.62 mg/dL — AB (ref 0.61–1.24)
Chloride: 105 mmol/L (ref 101–111)
GFR, EST AFRICAN AMERICAN: 55 mL/min — AB (ref >60)
GFR, EST NON AFRICAN AMERICAN: 47 mL/min — AB (ref >60)
Glucose, Bld: 95 mg/dL (ref 65–99)
Potassium: 3.9 mmol/L (ref 3.5–5.1)
Sodium: 136 mmol/L (ref 135–145)
TOTAL PROTEIN: 7.4 g/dL (ref 6.5–8.1)

## 2016-11-03 LAB — URINALYSIS, ROUTINE W REFLEX MICROSCOPIC
BILIRUBIN URINE: NEGATIVE
Bacteria, UA: NONE SEEN
Glucose, UA: NEGATIVE mg/dL
HGB URINE DIPSTICK: NEGATIVE
Ketones, ur: NEGATIVE mg/dL
LEUKOCYTES UA: NEGATIVE
Nitrite: NEGATIVE
PH: 5 (ref 5.0–8.0)
Protein, ur: 30 mg/dL — AB
SPECIFIC GRAVITY, URINE: 1.023 (ref 1.005–1.030)

## 2016-11-03 LAB — CBC
HEMATOCRIT: 39.7 % (ref 39.0–52.0)
HEMOGLOBIN: 13.4 g/dL (ref 13.0–17.0)
MCH: 27.4 pg (ref 26.0–34.0)
MCHC: 33.8 g/dL (ref 30.0–36.0)
MCV: 81.2 fL (ref 78.0–100.0)
Platelets: 277 10*3/uL (ref 150–400)
RBC: 4.89 MIL/uL (ref 4.22–5.81)
RDW: 14.1 % (ref 11.5–15.5)
WBC: 10.8 10*3/uL — ABNORMAL HIGH (ref 4.0–10.5)

## 2016-11-03 LAB — BLOOD GAS, ARTERIAL
Acid-base deficit: 0.6 mmol/L (ref 0.0–2.0)
BICARBONATE: 22.6 mmol/L (ref 20.0–28.0)
Drawn by: 421801
FIO2: 21
O2 Saturation: 98.2 %
PCO2 ART: 31 mmHg — AB (ref 32.0–48.0)
PO2 ART: 110 mmHg — AB (ref 83.0–108.0)
Patient temperature: 98.6
pH, Arterial: 7.475 — ABNORMAL HIGH (ref 7.350–7.450)

## 2016-11-03 LAB — TYPE AND SCREEN
ABO/RH(D): O POS
Antibody Screen: NEGATIVE

## 2016-11-03 LAB — PROTIME-INR
INR: 1.05
PROTHROMBIN TIME: 13.7 s (ref 11.4–15.2)

## 2016-11-03 LAB — SURGICAL PCR SCREEN
MRSA, PCR: NEGATIVE
Staphylococcus aureus: NEGATIVE

## 2016-11-03 LAB — APTT: aPTT: 27 seconds (ref 24–36)

## 2016-11-03 NOTE — Progress Notes (Addendum)
David [name]            11/02/2016                          [facility], [place] [address] [place] [address] Phone: [phone] Fax: [phone]              Your procedure is scheduled on July 16            Report to [place] at [facility]            Call this number if you have problems the morning of surgery:            [phone]             Remember:            Do not eat food or drink liquids after midnight.             Take these medicines the morning of surgery with A SIP OF WATER amiodarone (PACERONE), carvedilol (COREG, ivabradine (CORLANOR), Nasal Spray,  ranolazine (RANEXA)  7 days prior to surgery STOP taking any Aleve, Naproxen, Ibuprofen, Motrin, Advil, Goody's, BC's, all herbal medications, fish oil, and all vitamins  Stop Brilinta Thursday November 03, 2016  as instructed by your surgeon.  Per your surgeons instructions,  Continue taking Aspirin through 11/06/2016. Do not take morning of surgery             Do not wear jewelry            Do not wear lotions, powders, or cologne, or deoderant.            Do not shave 48 hours prior to surgery.              Do not bring valuables to the hospital.            [facility] is not responsible for any belongings or valuables.  Contacts, dentures or bridgework may not be worn into surgery.  Leave your suitcase in the car.  After surgery it may be brought to your room.  For patients admitted to the hospital, discharge time will be determined by your treatment team.  Patients discharged the day of surgery will not be allowed to drive home.    Special instructions:   [facility]- Preparing For Surgery  Before surgery, you can play an important role. Because skin is not sterile, your skin needs to be as free of germs as possible. You can reduce the number of germs on your skin by washing with CHG (chlorahexidine gluconate)  Soap before surgery.  CHG is an antiseptic cleaner which kills germs and bonds with the skin to continue killing germs even after washing.  Please do not use if you have an allergy to CHG or antibacterial soaps. If your skin becomes reddened/irritated stop using the CHG.  Do not shave (including legs and underarms) for at least 48 hours prior to first CHG shower. It is OK to shave your face.  Please follow these instructions carefully.  1. Shower the NIGHT BEFORE SURGERY and the MORNING OF SURGERY with CHG.   2. If you chose to wash your hair, wash your hair first as usual with your normal shampoo.  3. After you shampoo, rinse your hair and body thoroughly to remove the shampoo.  4. Use CHG as you would any other liquid soap. You can apply CHG directly to the skin and wash gently with a scrungie or a clean washcloth.   5. Apply the CHG Soap to your body ONLY FROM THE NECK DOWN.  Do not use on open wounds or open sores. Avoid contact with your eyes, ears, mouth and genitals (private parts). Wash genitals (private parts) with your normal soap.  6. Wash thoroughly, paying special attention to the area where your surgery will be performed.  7. Thoroughly rinse your body with warm water from the neck down.  8. DO NOT shower/wash with your normal soap after using and rinsing off the CHG Soap.  9. Pat yourself dry with a CLEAN TOWEL.   10. Wear CLEAN PAJAMAS   11. Place CLEAN SHEETS on your bed the night of your first shower and DO NOT SLEEP WITH PETS.    Day of Surgery: Do not apply any deodorants/lotions. Please wear clean clothes to the hospital/surgery center.      Please read over the following fact sheets that you were given.

## 2016-11-03 NOTE — Progress Notes (Signed)
PCP: Dr. Bea Graff  Cardiologist: Dr. Haroldine Laws  EKG: 08/10/2016 in EPIC and today  Stress test: pt denies  ECHO: 07/19/2016  Cardiac Cath: 04/2016  Chest x-ray: 07/2016

## 2016-11-06 MED ORDER — DEXTROSE 5 % IV SOLN
1.5000 g | INTRAVENOUS | Status: AC
  Administered 2016-11-07: 1.5 g via INTRAVENOUS
  Filled 2016-11-06: qty 1.5

## 2016-11-06 NOTE — Anesthesia Preprocedure Evaluation (Addendum)
Anesthesia Evaluation  Patient identified by MRN, date of birth, ID band Patient awake    Reviewed: Allergy & Precautions, NPO status , Patient's Chart, lab work & pertinent test results  History of Anesthesia Complications Negative for: history of anesthetic complications  Airway Mallampati: II  TM Distance: >3 FB Neck ROM: full    Dental  (+) Dental Advidsory Given, Missing, Poor Dentition, Dental Advisory Given, Chipped   Pulmonary COPD, former smoker (quit 1/18),    breath sounds clear to auscultation       Cardiovascular hypertension, Pt. on medications and Pt. on home beta blockers (-) angina+ CAD, + Past MI, + Cardiac Stents (RCA, LAD) and +CHF  + dysrhythmias Supra Ventricular Tachycardia and Ventricular Tachycardia + pacemaker + Cardiac Defibrillator  Rhythm:regular Rate:Bradycardia  4/18 ECHO: EF 25%, mild MR, mild AI, mild TR   Neuro/Psych Seizures -, Well Controlled,  negative psych ROS   GI/Hepatic negative GI ROS, Neg liver ROS,   Endo/Other  negative endocrine ROS  Renal/GU Renal InsufficiencyRenal disease (creat 1.62)     Musculoskeletal negative musculoskeletal ROS (+)   Abdominal   Peds  Hematology brilinta   Anesthesia Other Findings   Reproductive/Obstetrics                           Anesthesia Physical Anesthesia Plan  ASA: IV  Anesthesia Plan: General   Post-op Pain Management:    Induction: Intravenous  PONV Risk Score and Plan: 2 and Ondansetron and Dexamethasone  Airway Management Planned: Oral ETT and Double Lumen EBT  Additional Equipment: Arterial line, CVP and Ultrasound Guidance Line Placement  Intra-op Plan:   Post-operative Plan: Possible Post-op intubation/ventilation  Informed Consent: I have reviewed the patients History and Physical, chart, labs and discussed the procedure including the risks, benefits and alternatives for the proposed  anesthesia with the patient or authorized representative who has indicated his/her understanding and acceptance.   Dental Advisory Given  Plan Discussed with: Anesthesiologist, CRNA and Surgeon  Anesthesia Plan Comments: (Plan routine monitors, A line, Central line, GETA with DLT, possible post op ventilation)       Anesthesia Quick Evaluation

## 2016-11-07 ENCOUNTER — Encounter (HOSPITAL_COMMUNITY)
Admission: RE | Disposition: A | Discharge status: Home / Self Care | Payer: Self-pay | Source: Ambulatory Visit | Attending: Surgery

## 2016-11-07 ENCOUNTER — Inpatient Hospital Stay (HOSPITAL_COMMUNITY): Payer: Medicare HMO

## 2016-11-07 ENCOUNTER — Inpatient Hospital Stay (HOSPITAL_COMMUNITY): Payer: Medicare HMO | Admitting: Anesthesiology

## 2016-11-07 ENCOUNTER — Inpatient Hospital Stay (HOSPITAL_COMMUNITY)
Admission: RE | Admit: 2016-11-07 | Discharge: 2016-11-11 | DRG: 261 | Disposition: A | Discharge status: Home / Self Care | Payer: Medicare HMO | Source: Ambulatory Visit | Attending: Surgery | Admitting: Surgery

## 2016-11-07 ENCOUNTER — Encounter (HOSPITAL_COMMUNITY): Payer: Self-pay

## 2016-11-07 DIAGNOSIS — I480 Paroxysmal atrial fibrillation: Secondary | ICD-10-CM | POA: Diagnosis present

## 2016-11-07 DIAGNOSIS — Z955 Presence of coronary angioplasty implant and graft: Secondary | ICD-10-CM | POA: Diagnosis not present

## 2016-11-07 DIAGNOSIS — Z95810 Presence of automatic (implantable) cardiac defibrillator: Secondary | ICD-10-CM

## 2016-11-07 DIAGNOSIS — I4892 Unspecified atrial flutter: Secondary | ICD-10-CM | POA: Diagnosis present

## 2016-11-07 DIAGNOSIS — J9811 Atelectasis: Secondary | ICD-10-CM | POA: Diagnosis not present

## 2016-11-07 DIAGNOSIS — I255 Ischemic cardiomyopathy: Principal | ICD-10-CM | POA: Diagnosis present

## 2016-11-07 DIAGNOSIS — I252 Old myocardial infarction: Secondary | ICD-10-CM | POA: Diagnosis not present

## 2016-11-07 DIAGNOSIS — I5022 Chronic systolic (congestive) heart failure: Secondary | ICD-10-CM | POA: Diagnosis not present

## 2016-11-07 DIAGNOSIS — J449 Chronic obstructive pulmonary disease, unspecified: Secondary | ICD-10-CM | POA: Diagnosis present

## 2016-11-07 DIAGNOSIS — E785 Hyperlipidemia, unspecified: Secondary | ICD-10-CM | POA: Diagnosis present

## 2016-11-07 DIAGNOSIS — N182 Chronic kidney disease, stage 2 (mild): Secondary | ICD-10-CM | POA: Diagnosis present

## 2016-11-07 DIAGNOSIS — Z888 Allergy status to other drugs, medicaments and biological substances status: Secondary | ICD-10-CM

## 2016-11-07 DIAGNOSIS — Z7982 Long term (current) use of aspirin: Secondary | ICD-10-CM | POA: Diagnosis not present

## 2016-11-07 DIAGNOSIS — F17290 Nicotine dependence, other tobacco product, uncomplicated: Secondary | ICD-10-CM | POA: Diagnosis present

## 2016-11-07 DIAGNOSIS — R0602 Shortness of breath: Secondary | ICD-10-CM | POA: Diagnosis present

## 2016-11-07 DIAGNOSIS — Z9889 Other specified postprocedural states: Secondary | ICD-10-CM

## 2016-11-07 DIAGNOSIS — I447 Left bundle-branch block, unspecified: Secondary | ICD-10-CM | POA: Diagnosis present

## 2016-11-07 DIAGNOSIS — Z79899 Other long term (current) drug therapy: Secondary | ICD-10-CM

## 2016-11-07 DIAGNOSIS — I130 Hypertensive heart and chronic kidney disease with heart failure and stage 1 through stage 4 chronic kidney disease, or unspecified chronic kidney disease: Secondary | ICD-10-CM | POA: Diagnosis present

## 2016-11-07 DIAGNOSIS — I2510 Atherosclerotic heart disease of native coronary artery without angina pectoris: Secondary | ICD-10-CM | POA: Diagnosis present

## 2016-11-07 HISTORY — PX: EPICARDIAL PACING LEAD PLACEMENT: SHX6274

## 2016-11-07 LAB — GLUCOSE, CAPILLARY
GLUCOSE-CAPILLARY: 109 mg/dL — AB (ref 65–99)
Glucose-Capillary: 171 mg/dL — ABNORMAL HIGH (ref 65–99)

## 2016-11-07 SURGERY — INSERTION, EPICARDIAL ELECTRODE LEAD
Anesthesia: General | Site: Chest | Laterality: Left

## 2016-11-07 MED ORDER — LINACLOTIDE 290 MCG PO CAPS
290.0000 ug | ORAL_CAPSULE | Freq: Every day | ORAL | Status: DC | PRN
  Filled 2016-11-07: qty 1

## 2016-11-07 MED ORDER — LACTATED RINGERS IV SOLN
INTRAVENOUS | Status: DC | PRN
  Administered 2016-11-07 (×2): via INTRAVENOUS

## 2016-11-07 MED ORDER — SODIUM CHLORIDE 0.9% FLUSH: 10.0000 mL | INTRAVENOUS | Status: DC | PRN

## 2016-11-07 MED ORDER — POTASSIUM CHLORIDE 10 MEQ/50ML IV SOLN: 10.0000 meq | Freq: Every day | INTRAVENOUS | Status: DC | PRN

## 2016-11-07 MED ORDER — ONDANSETRON HCL 4 MG/2ML IJ SOLN
INTRAMUSCULAR | Status: DC | PRN
  Administered 2016-11-07: 4 mg via INTRAVENOUS

## 2016-11-07 MED ORDER — HYDROMORPHONE HCL 1 MG/ML IJ SOLN
INTRAMUSCULAR | Status: AC
  Filled 2016-11-07: qty 0.5

## 2016-11-07 MED ORDER — HYDROMORPHONE HCL 1 MG/ML IJ SOLN
0.2500 mg | INTRAMUSCULAR | Status: DC | PRN
  Administered 2016-11-07 (×2): 0.5 mg via INTRAVENOUS

## 2016-11-07 MED ORDER — 0.9 % SODIUM CHLORIDE (POUR BTL) OPTIME
TOPICAL | Status: DC | PRN
  Administered 2016-11-07: 2000 mL

## 2016-11-07 MED ORDER — SUGAMMADEX SODIUM 200 MG/2ML IV SOLN
INTRAVENOUS | Status: AC
  Filled 2016-11-07: qty 2

## 2016-11-07 MED ORDER — ACETAMINOPHEN 500 MG PO TABS
1000.0000 mg | ORAL_TABLET | Freq: Four times a day (QID) | ORAL | Status: DC
  Administered 2016-11-07 – 2016-11-08 (×7): 1000 mg via ORAL
  Filled 2016-11-07 (×7): qty 2

## 2016-11-07 MED ORDER — FENTANYL CITRATE (PF) 250 MCG/5ML IJ SOLN
INTRAMUSCULAR | Status: AC
  Filled 2016-11-07: qty 5

## 2016-11-07 MED ORDER — SENNOSIDES-DOCUSATE SODIUM 8.6-50 MG PO TABS
1.0000 | ORAL_TABLET | Freq: Every day | ORAL | Status: DC
  Administered 2016-11-07 – 2016-11-10 (×4): 1 via ORAL
  Filled 2016-11-07 (×4): qty 1

## 2016-11-07 MED ORDER — MIDAZOLAM HCL 2 MG/2ML IJ SOLN
INTRAMUSCULAR | Status: AC
  Filled 2016-11-07: qty 2

## 2016-11-07 MED ORDER — IVABRADINE HCL 5 MG PO TABS
2.5000 mg | ORAL_TABLET | Freq: Two times a day (BID) | ORAL | Status: DC
  Administered 2016-11-07 – 2016-11-11 (×7): 2.5 mg via ORAL
  Filled 2016-11-07 (×11): qty 1

## 2016-11-07 MED ORDER — DEXTROSE 5 % IV SOLN
1.5000 g | Freq: Two times a day (BID) | INTRAVENOUS | Status: AC
  Administered 2016-11-07 – 2016-11-08 (×2): 1.5 g via INTRAVENOUS
  Filled 2016-11-07 (×2): qty 1.5

## 2016-11-07 MED ORDER — SUGAMMADEX SODIUM 200 MG/2ML IV SOLN
INTRAVENOUS | Status: DC | PRN
  Administered 2016-11-07: 200 mg via INTRAVENOUS

## 2016-11-07 MED ORDER — ONDANSETRON HCL 4 MG/2ML IJ SOLN: 4.0000 mg | Freq: Four times a day (QID) | INTRAMUSCULAR | Status: DC | PRN

## 2016-11-07 MED ORDER — DEXAMETHASONE SODIUM PHOSPHATE 10 MG/ML IJ SOLN
INTRAMUSCULAR | Status: AC
  Filled 2016-11-07: qty 1

## 2016-11-07 MED ORDER — FENTANYL CITRATE (PF) 100 MCG/2ML IJ SOLN
INTRAMUSCULAR | Status: DC | PRN
  Administered 2016-11-07: 50 ug via INTRAVENOUS
  Administered 2016-11-07: 450 ug via INTRAVENOUS

## 2016-11-07 MED ORDER — LACTATED RINGERS IV SOLN
INTRAVENOUS | Status: DC | PRN
  Administered 2016-11-07: 07:00:00 via INTRAVENOUS

## 2016-11-07 MED ORDER — SODIUM CHLORIDE 0.9% FLUSH: 9.0000 mL | INTRAVENOUS | Status: DC | PRN

## 2016-11-07 MED ORDER — PROMETHAZINE HCL 25 MG/ML IJ SOLN: 6.2500 mg | INTRAMUSCULAR | Status: DC | PRN

## 2016-11-07 MED ORDER — DIPHENHYDRAMINE HCL 50 MG/ML IJ SOLN: 12.5000 mg | Freq: Four times a day (QID) | INTRAMUSCULAR | Status: DC | PRN

## 2016-11-07 MED ORDER — FUROSEMIDE 40 MG PO TABS
40.0000 mg | ORAL_TABLET | Freq: Two times a day (BID) | ORAL | Status: DC
  Administered 2016-11-07 – 2016-11-11 (×8): 40 mg via ORAL
  Filled 2016-11-07 (×8): qty 1

## 2016-11-07 MED ORDER — PHENYLEPHRINE 40 MCG/ML (10ML) SYRINGE FOR IV PUSH (FOR BLOOD PRESSURE SUPPORT)
PREFILLED_SYRINGE | INTRAVENOUS | Status: AC
  Filled 2016-11-07: qty 10

## 2016-11-07 MED ORDER — CHLORHEXIDINE GLUCONATE CLOTH 2 % EX PADS: 6.0000 | MEDICATED_PAD | Freq: Every day | CUTANEOUS | Status: DC

## 2016-11-07 MED ORDER — METOCLOPRAMIDE HCL 5 MG/ML IJ SOLN
5.0000 mg | Freq: Four times a day (QID) | INTRAMUSCULAR | Status: AC
  Administered 2016-11-07 – 2016-11-08 (×4): 5 mg via INTRAVENOUS
  Filled 2016-11-07 (×4): qty 2

## 2016-11-07 MED ORDER — CARVEDILOL 3.125 MG PO TABS
3.1250 mg | ORAL_TABLET | Freq: Two times a day (BID) | ORAL | Status: DC
  Administered 2016-11-07 – 2016-11-11 (×8): 3.125 mg via ORAL
  Filled 2016-11-07 (×8): qty 1

## 2016-11-07 MED ORDER — DEXTROSE-NACL 5-0.9 % IV SOLN
INTRAVENOUS | Status: DC
  Administered 2016-11-07: 13:00:00 via INTRAVENOUS

## 2016-11-07 MED ORDER — DEXAMETHASONE SODIUM PHOSPHATE 10 MG/ML IJ SOLN
INTRAMUSCULAR | Status: DC | PRN
  Administered 2016-11-07: 10 mg via INTRAVENOUS

## 2016-11-07 MED ORDER — ROCURONIUM BROMIDE 100 MG/10ML IV SOLN
INTRAVENOUS | Status: DC | PRN
  Administered 2016-11-07: 50 mg via INTRAVENOUS
  Administered 2016-11-07: 10 mg via INTRAVENOUS

## 2016-11-07 MED ORDER — ROCURONIUM BROMIDE 50 MG/5ML IV SOLN
INTRAVENOUS | Status: AC
  Filled 2016-11-07: qty 2

## 2016-11-07 MED ORDER — ARTIFICIAL TEARS OPHTHALMIC OINT
TOPICAL_OINTMENT | OPHTHALMIC | Status: DC | PRN
  Administered 2016-11-07: 1 via OPHTHALMIC

## 2016-11-07 MED ORDER — MEPERIDINE HCL 25 MG/ML IJ SOLN: 6.2500 mg | INTRAMUSCULAR | Status: DC | PRN

## 2016-11-07 MED ORDER — SODIUM CHLORIDE 0.9% FLUSH: 10.0000 mL | Freq: Two times a day (BID) | INTRAVENOUS | Status: DC

## 2016-11-07 MED ORDER — ACETAMINOPHEN 160 MG/5ML PO SOLN: 1000.0000 mg | Freq: Four times a day (QID) | ORAL | Status: DC

## 2016-11-07 MED ORDER — FENTANYL 40 MCG/ML IV SOLN
INTRAVENOUS | Status: DC
  Administered 2016-11-07: 105 ug via INTRAVENOUS
  Administered 2016-11-07: 1000 ug via INTRAVENOUS
  Administered 2016-11-07: 75 ug via INTRAVENOUS
  Administered 2016-11-08: 45 ug via INTRAVENOUS
  Administered 2016-11-08: 90 ug via INTRAVENOUS
  Filled 2016-11-07 (×2): qty 25

## 2016-11-07 MED ORDER — EPHEDRINE SULFATE 50 MG/ML IJ SOLN
INTRAMUSCULAR | Status: DC | PRN
  Administered 2016-11-07: 10 mg via INTRAVENOUS
  Administered 2016-11-07: 5 mg via INTRAVENOUS

## 2016-11-07 MED ORDER — INSULIN ASPART 100 UNIT/ML ~~LOC~~ SOLN
0.0000 [IU] | SUBCUTANEOUS | Status: DC
  Administered 2016-11-07: 4 [IU] via SUBCUTANEOUS
  Administered 2016-11-07 – 2016-11-08 (×2): 2 [IU] via SUBCUTANEOUS

## 2016-11-07 MED ORDER — RANOLAZINE ER 500 MG PO TB12
500.0000 mg | ORAL_TABLET | Freq: Two times a day (BID) | ORAL | Status: DC
  Administered 2016-11-07 – 2016-11-11 (×8): 500 mg via ORAL
  Filled 2016-11-07 (×9): qty 1

## 2016-11-07 MED ORDER — PHENYLEPHRINE HCL 10 MG/ML IJ SOLN
INTRAVENOUS | Status: DC | PRN
  Administered 2016-11-07: 09:00:00 via INTRAVENOUS
  Administered 2016-11-07: 75 ug/min via INTRAVENOUS

## 2016-11-07 MED ORDER — NALOXONE HCL 0.4 MG/ML IJ SOLN: 0.4000 mg | INTRAMUSCULAR | Status: DC | PRN

## 2016-11-07 MED ORDER — BISACODYL 5 MG PO TBEC
10.0000 mg | DELAYED_RELEASE_TABLET | Freq: Every day | ORAL | Status: DC
  Administered 2016-11-07 – 2016-11-09 (×3): 10 mg via ORAL
  Filled 2016-11-07 (×4): qty 2

## 2016-11-07 MED ORDER — AMIODARONE HCL 200 MG PO TABS
200.0000 mg | ORAL_TABLET | Freq: Two times a day (BID) | ORAL | Status: DC
  Administered 2016-11-07 – 2016-11-11 (×8): 200 mg via ORAL
  Filled 2016-11-07 (×9): qty 1

## 2016-11-07 MED ORDER — ONDANSETRON HCL 4 MG/2ML IJ SOLN
INTRAMUSCULAR | Status: AC
  Filled 2016-11-07: qty 2

## 2016-11-07 MED ORDER — PROPOFOL 10 MG/ML IV BOLUS
INTRAVENOUS | Status: DC | PRN
  Administered 2016-11-07: 50 mg via INTRAVENOUS

## 2016-11-07 MED ORDER — EPHEDRINE 5 MG/ML INJ
INTRAVENOUS | Status: AC
  Filled 2016-11-07: qty 10

## 2016-11-07 MED ORDER — DIPHENHYDRAMINE HCL 12.5 MG/5ML PO ELIX: 12.5000 mg | ORAL_SOLUTION | Freq: Four times a day (QID) | ORAL | Status: DC | PRN

## 2016-11-07 MED ORDER — OXYMETAZOLINE HCL 0.05 % NA SOLN
2.0000 | Freq: Two times a day (BID) | NASAL | Status: DC | PRN
Start: 2016-11-07 — End: 2016-11-11
  Filled 2016-11-07: qty 15

## 2016-11-07 MED ORDER — ASPIRIN 81 MG PO CHEW
81.0000 mg | CHEWABLE_TABLET | Freq: Every day | ORAL | Status: DC
  Administered 2016-11-07 – 2016-11-11 (×5): 81 mg via ORAL
  Filled 2016-11-07 (×5): qty 1

## 2016-11-07 MED ORDER — VANCOMYCIN HCL IN DEXTROSE 1-5 GM/200ML-% IV SOLN
1000.0000 mg | Freq: Two times a day (BID) | INTRAVENOUS | Status: AC
  Administered 2016-11-07: 1000 mg via INTRAVENOUS
  Filled 2016-11-07: qty 200

## 2016-11-07 MED ORDER — MIDAZOLAM HCL 5 MG/5ML IJ SOLN
INTRAMUSCULAR | Status: DC | PRN
  Administered 2016-11-07: 2 mg via INTRAVENOUS

## 2016-11-07 MED ORDER — TRAMADOL HCL 50 MG PO TABS
50.0000 mg | ORAL_TABLET | Freq: Four times a day (QID) | ORAL | Status: DC | PRN
  Administered 2016-11-07: 100 mg via ORAL
  Filled 2016-11-07: qty 2

## 2016-11-07 MED ORDER — MIDAZOLAM HCL 2 MG/2ML IJ SOLN: 0.5000 mg | Freq: Once | INTRAMUSCULAR | Status: DC | PRN

## 2016-11-07 MED ORDER — OXYCODONE HCL 5 MG PO TABS
5.0000 mg | ORAL_TABLET | ORAL | Status: DC | PRN
  Administered 2016-11-07 (×2): 5 mg via ORAL
  Administered 2016-11-08 – 2016-11-11 (×18): 10 mg via ORAL
  Filled 2016-11-07 (×5): qty 2
  Filled 2016-11-07: qty 1
  Filled 2016-11-07 (×7): qty 2
  Filled 2016-11-07: qty 1
  Filled 2016-11-07 (×8): qty 2

## 2016-11-07 MED ORDER — PROPOFOL 10 MG/ML IV BOLUS
INTRAVENOUS | Status: AC
Start: 2016-11-07 — End: 2016-11-07
  Filled 2016-11-07: qty 20

## 2016-11-07 SURGICAL SUPPLY — 54 items
BLADE SURG 10 STRL SS (BLADE) ×3 IMPLANT
CABLE SURGICAL S-101-97-12 (CABLE) ×3 IMPLANT
CANISTER SUCT 3000ML PPV (MISCELLANEOUS) ×3 IMPLANT
CATH THORACIC 28FR (CATHETERS) ×3 IMPLANT
CLIP TI MEDIUM 24 (CLIP) ×3 IMPLANT
CLIP TI WIDE RED SMALL 24 (CLIP) ×3 IMPLANT
CLOSURE WOUND 1/4X4 (GAUZE/BANDAGES/DRESSINGS) ×1
COVER SURGICAL LIGHT HANDLE (MISCELLANEOUS) ×6 IMPLANT
DRAPE C-ARM 42X72 X-RAY (DRAPES) IMPLANT
DRAPE HALF SHEET 40X57 (DRAPES) ×6 IMPLANT
DRAPE INCISE IOBAN 66X45 STRL (DRAPES) ×3 IMPLANT
DRAPE LAPAROSCOPIC ABDOMINAL (DRAPES) ×3 IMPLANT
ELECT CAUTERY BLADE 6.4 (BLADE) ×3 IMPLANT
ELECT REM PT RETURN 9FT ADLT (ELECTROSURGICAL) ×3
ELECTRODE REM PT RTRN 9FT ADLT (ELECTROSURGICAL) ×1 IMPLANT
GAUZE SPONGE 4X4 12PLY STRL (GAUZE/BANDAGES/DRESSINGS) ×3 IMPLANT
GAUZE SPONGE 4X4 12PLY STRL LF (GAUZE/BANDAGES/DRESSINGS) ×3 IMPLANT
GAUZE SPONGE 4X4 16PLY XRAY LF (GAUZE/BANDAGES/DRESSINGS) ×3 IMPLANT
GOWN STRL REUS W/ TWL LRG LVL3 (GOWN DISPOSABLE) ×2 IMPLANT
GOWN STRL REUS W/ TWL XL LVL3 (GOWN DISPOSABLE) ×1 IMPLANT
GOWN STRL REUS W/TWL LRG LVL3 (GOWN DISPOSABLE) ×4
GOWN STRL REUS W/TWL XL LVL3 (GOWN DISPOSABLE) ×2
IMPL BIOMEC 54 ~~LOC~~ (Pacemaker) ×2 IMPLANT
IMPLANT BIOMEC 54 ~~LOC~~ (Pacemaker) ×6 IMPLANT
KIT BASIN OR (CUSTOM PROCEDURE TRAY) ×3 IMPLANT
KIT ROOM TURNOVER OR (KITS) ×3 IMPLANT
KIT WRENCH (KITS) ×3 IMPLANT
NS IRRIG 1000ML POUR BTL (IV SOLUTION) ×6 IMPLANT
PACK SURGICAL SETUP 50X90 (CUSTOM PROCEDURE TRAY) ×3 IMPLANT
PAD ARMBOARD 7.5X6 YLW CONV (MISCELLANEOUS) ×6 IMPLANT
PENCIL BUTTON HOLSTER BLD 10FT (ELECTRODE) ×3 IMPLANT
SCRUB BETADINE 4OZ XXX (MISCELLANEOUS) ×3 IMPLANT
SOL PREP POV-IOD 4OZ 10% (MISCELLANEOUS) ×3 IMPLANT
STRIP CLOSURE SKIN 1/4X4 (GAUZE/BANDAGES/DRESSINGS) ×2 IMPLANT
SUT SILK  1 MH (SUTURE) ×2
SUT SILK 1 MH (SUTURE) ×1 IMPLANT
SUT SILK 1 TIES 10X30 (SUTURE) ×3 IMPLANT
SUT SILK 2 0 SH CR/8 (SUTURE) IMPLANT
SUT SILK 2 0 TIES 10X30 (SUTURE) ×3 IMPLANT
SUT VIC AB 1 CTX 36 (SUTURE) ×2
SUT VIC AB 1 CTX36XBRD ANBCTR (SUTURE) ×1 IMPLANT
SUT VIC AB 2-0 CT1 27 (SUTURE) ×4
SUT VIC AB 2-0 CT1 TAPERPNT 27 (SUTURE) ×2 IMPLANT
SUT VIC AB 3-0 X1 27 (SUTURE) ×6 IMPLANT
SUT VICRYL 2 TP 1 (SUTURE) ×3 IMPLANT
SYR BULB IRRIGATION 50ML (SYRINGE) ×3 IMPLANT
SYSTEM SAHARA CHEST DRAIN ATS (WOUND CARE) ×3 IMPLANT
TAPE CLOTH SURG 4X10 WHT LF (GAUZE/BANDAGES/DRESSINGS) ×3 IMPLANT
TOWEL OR 17X24 6PK STRL BLUE (TOWEL DISPOSABLE) ×3 IMPLANT
TRAY FOLEY W/METER SILVER 16FR (SET/KITS/TRAYS/PACK) ×3 IMPLANT
TUBE CONNECTING 12'X1/4 (SUCTIONS) ×1
TUBE CONNECTING 12X1/4 (SUCTIONS) ×2 IMPLANT
WATER STERILE IRR 1000ML POUR (IV SOLUTION) ×3 IMPLANT
YANKAUER SUCT BULB TIP NO VENT (SUCTIONS) ×3 IMPLANT

## 2016-11-07 NOTE — Anesthesia Procedure Notes (Signed)
Procedure Name: Intubation Date/Time: 11/07/2016 7:38 AM Performed by: Neldon Newport Pre-anesthesia Checklist: Timeout performed, Suction available, Patient being monitored, Emergency Drugs available and Patient identified Patient Re-evaluated:Patient Re-evaluated prior to induction Oxygen Delivery Method: Circle system utilized Preoxygenation: Pre-oxygenation with 100% oxygen Induction Type: IV induction Ventilation: Mask ventilation without difficulty and Oral airway inserted - appropriate to patient size Laryngoscope Size: Mac and 3 Grade View: Grade II Tube type: MLT Endobronchial tube: Left and 37 Fr Number of attempts: 1 Placement Confirmation: breath sounds checked- equal and bilateral,  positive ETCO2 and ETT inserted through vocal cords under direct vision Tube secured with: Tape Dental Injury: Teeth and Oropharynx as per pre-operative assessment

## 2016-11-07 NOTE — Interval H&P Note (Signed)
History and Physical Interval Note:  11/07/2016 7:10 AM  David [name]  has presented today for surgery, with the diagnosis of ICM  The various methods of treatment have been discussed with the patient and family. After consideration of risks, benefits and other options for treatment, the patient has consented to  Procedure(s): LV EPICARDIAL PACING LEAD PLACEMENT VIA LEFT MINI THORACOTOMY (Left) as a surgical intervention .  The patient's history has been reviewed, patient examined, no change in status, stable for surgery.  I have reviewed the patient's chart and labs.  Questions were answered to the patient's satisfaction.     Gaye Pollack

## 2016-11-07 NOTE — Brief Op Note (Signed)
11/07/2016  9:53 AM  PATIENT:  David [name]  51 y.o. male  PRE-OPERATIVE DIAGNOSIS:  ICM  POST-OPERATIVE DIAGNOSIS:  ICM  PROCEDURE:  Procedure(s): LV EPICARDIAL PACING LEAD PLACEMENT VIA LEFT MINI THORACOTOMY (Left)  SURGEON:  Surgeon(s) and Role:    * Bartle, Fernande Boyden, MD - Primary  PHYSICIAN ASSISTANT: none  ASSISTANTS: none   ANESTHESIA:   general  EBL:  Total I/O In: 2070.5 [I.V.:2020.5; IV Piggyback:50] Out: 220 [Urine:200; Blood:20]  BLOOD ADMINISTERED:none  DRAINS: one 104F Chest Tube(s) in the left pleural space   LOCAL MEDICATIONS USED:  NONE  SPECIMEN:  No Specimen  DISPOSITION OF SPECIMEN:  N/A  COUNTS:  YES  TOURNIQUET:  * No tourniquets in log *  DICTATION: .Note written in EPIC  PLAN OF CARE: Admit to inpatient   PATIENT DISPOSITION:  PACU - hemodynamically stable.   Delay start of Pharmacological VTE agent (>24hrs) due to surgical blood loss or risk of bleeding: yes

## 2016-11-07 NOTE — Anesthesia Postprocedure Evaluation (Signed)
Anesthesia Post Note  Patient: Chin Wachter  Procedure(s) Performed: Procedure(s) (LRB): LV EPICARDIAL PACING LEAD PLACEMENT VIA LEFT MINI THORACOTOMY (Left)     Patient location during evaluation: PACU Anesthesia Type: General Level of consciousness: patient cooperative, oriented and sedated Pain management: pain level controlled Vital Signs Assessment: post-procedure vital signs reviewed and stable Respiratory status: spontaneous breathing, nonlabored ventilation, respiratory function stable and patient connected to nasal cannula oxygen Cardiovascular status: blood pressure returned to baseline and stable Postop Assessment: no signs of nausea or vomiting Anesthetic complications: no    Last Vitals:  Vitals:   11/07/16 1120 11/07/16 1130  BP:    Pulse:    Resp: 15   Temp:  36.5 C    Last Pain:  Vitals:   11/07/16 1130  TempSrc:   PainSc: 5                  Corisa Montini,E. Lesli Issa

## 2016-11-07 NOTE — Anesthesia Procedure Notes (Signed)
Central Venous Catheter Insertion Performed by: Annye Asa, anesthesiologist Start/End7/16/2018 6:54 AM, 11/07/2016 7:10 AM Patient location: Pre-op. Preanesthetic checklist: patient identified, IV checked, site marked, risks and benefits discussed, surgical consent, monitors and equipment checked, pre-op evaluation, timeout performed and anesthesia consent Position: supine Lidocaine 1% used for infiltration Hand hygiene performed , maximum sterile barriers used  and Seldinger technique used Catheter size: 8 Fr Central line was placed.Double lumen Procedure performed using ultrasound guided technique. Ultrasound Notes:anatomy identified, needle tip was noted to be adjacent to the nerve/plexus identified, no ultrasound evidence of intravascular and/or intraneural injection and image(s) printed for medical record Attempts: 1 Following insertion, line sutured, dressing applied and Biopatch. Post procedure assessment: blood return through all ports, free fluid flow and no air  Patient tolerated the procedure well with no immediate complications. Additional procedure comments: CVP: Timeout, sterile prep, drape, FBP R neck. Supine position.  1% lido local, finder and trocar RIJ 1st pass with US guidance.  2 lumen placed over J wire. Biopatch and sterile dressing on.  Patient tolerated well.  VSS.  Jenita Seashore, MD.

## 2016-11-07 NOTE — Op Note (Signed)
CARDIOTHORACIC SURGERY OPERATIVE NOTE  11/07/2016 Glean Salvo [id number]  Surgeon:  Gaye Pollack, MD  First Assistant: none   Preoperative Diagnosis:  Ischemic cardiomyopathy with chronic systolic heart failure.  Postoperative Diagnosis: same  Procedure:  1. Left anterolateral thoracotomy 2. Insertion of 2 left ventricular epicardial pacing leads 3. Revision of Biventricular pacing system  Anesthesia:  General Endotracheal   Clinical History/Surgical Indication:  The patient is a 51 year old gentleman with hypertension, hyperlipidemia, CAD s/p multiple PCI/Stents and ischemic cardiomyopathy with an EF of 15-20% by echo 05/02/2016 when he presented to [facility] with a two week history of progressive shortness of breath and orthopnea. He was in afib/flutter and suffered a brady/PEA arrest, was intubated and started on pressors and transferred to [facility]. He was weaned off pressors and extubated and underwent cath and PCI with DES x 2 to the RCA and DES to the mid LAD. He has been on DAPT with Brilinta since. He was admitted in early April for palpitations and weakness and found to have frequent PVC's. An ICD was placed but unable to place an LV lead. Echo at that time showed an EF of 25%. He has been followed by Dr. Haroldine Laws. The patient says that he feels well now. He is walking with no shortness of breath as long as he takes his time. He denies fatigue and chest discomfort. He denies orthopnea and peripheral edema.   He has ischemic cardiomyopathy with an EF of 25%, ECG with LBBB with QRS 156 ms and has an ICD in place but could not insert a transvenous LV lead. He is doing well but I agree with benefit of a BiV ICD for this patient. He had 3 DES placed on 05/09/2016 so he needed to stay on Brilinta for 6 months before stopping it for surgical placement of an LV epicardial lead. He has now completed that course and we feel that it is safe to stop the Brilinta for surgery.  I discussed the surgical  procedure with the patient and his wife including alternative of no LV lead, benefits and risk including but not limited to bleeding, blood transfusion, infection and possible need for removal of the ICD system if it got infected, injury to the heart and system malfunction in the future requiring revision. He understands and agrees to proceed.  Preparation:  The patient was seen in the preoperative holding area and the correct patient, correct operation, correct operative side were confirmed with the patient after reviewing the medical record. The consent was signed by me. Preoperative antibiotics were given.  The patient was taken back to the operating room and positioned supine on the operating room table. After being placed under general endotracheal anesthesia by the anesthesia team using a double lumen tube a foley catheter was placed. A roll was place beneath the left back to tilt the left side up slightly. The chest was prepped with betadine soap and solution.  A surgical time-out was taken and the correct patient,operative side, and operative procedure were confirmed with the nursing and anesthesia staff.   Operative Procedure:  A short anterolateral thoracotomy incision was made below the pectoralis border. The subcutaneous tissue was divided using electrocautery. The serratus muscle was split along its fibers. The pleural space was entered through the 6th intercostal space. The pericardium was immediately adjacent to the chest wall due to cardiac enlargement. The pericardium was opened. A site was chosen for pacing lead insertion on the mid-lateral wall. A [employer] pacing lead  was screwed into the myocardium ( model [id number], SN [id number]). It was tested and the R wave was 6.7 mV. Impedence was 936. Threshold was 1.2 V@ 1.47ms. Another lead of the same type was screwed into the myocardium about 2 cm away from the first ( model [id number], SN [id number]). Testing showed an R wave of 8.6, Impedence of  1061, and a threshold of 2.3@ 1.0 ms. The generator pocket was opened through the prior incision. There was a small amout of serous fluid present in the pocket. The generator was removed. The two LV epicardial leads were brought out through the interspace and tunneled along the subcutaneous tissue of the chest wall up to the generator pocket. The first lead ([id number]) was connected to the generator and the other lead was capped.  The generator was replaced in the pocket and the excess lead coiled behind it. The pocket was irrigated with saline. The subcutaneous tissue was closed with 2-0 vicryl continuous suture. The skin was closed with 3-0 vicryl subcuticular suture. A 28 F chest tube was brought through a separate incision and positioned in the left pleural space. The thoracotomy incision was closed using continuous #0 vicryl suture for the serratus muscle, 2-0 vicryl subcutaneous suture and 3-0 vicryl subcuticular suture.  All sponge, needle, and instrument counts were reported correct at the end of the case. Dry sterile dressings were placed over the incisions and around the chest tube which was connected to pleurevac suction. The patient was extubated and transported to the PACU in satisfactory and stable condition.

## 2016-11-07 NOTE — Anesthesia Procedure Notes (Signed)
Arterial Line Insertion Start/End7/16/2018 6:59 AM Performed by: Neldon Newport, CRNA  Patient location: Pre-op. Lidocaine 1% used for infiltration and patient sedated Left, radial was placed Catheter size: 20 G  Attempts: 1 Procedure performed without using ultrasound guided technique. Following insertion, dressing applied. Post procedure assessment: normal and unchanged  Patient tolerated the procedure well with no immediate complications.

## 2016-11-07 NOTE — Transfer of Care (Signed)
Immediate Anesthesia Transfer of Care Note  Patient: David [name]  Procedure(s) Performed: Procedure(s): LV EPICARDIAL PACING LEAD PLACEMENT VIA LEFT MINI THORACOTOMY (Left)  Patient Location: PACU  Anesthesia Type:General  Level of Consciousness: awake, alert  and oriented  Airway & Oxygen Therapy: Patient Spontanous Breathing and Patient connected to face mask oxygen  Post-op Assessment: Report given to RN, Post -op Vital signs reviewed and stable and Patient moving all extremities X 4  Post vital signs: Reviewed and stable  Last Vitals:  Vitals:   11/07/16 0559  BP: 120/74  Pulse: 67  Resp: 18  Temp: 36.7 C    Last Pain:  Vitals:   11/07/16 0559  TempSrc: Oral         Complications: No apparent anesthesia complications

## 2016-11-07 NOTE — Progress Notes (Signed)
[facility] BRIEF SICU PROGRESS NOTE  Day of Surgery  S/P Procedure(s) (LRB): LV EPICARDIAL PACING LEAD PLACEMENT VIA LEFT MINI THORACOTOMY (Left)   Doing well postop Good analgesia Stable HR and BP Breathing comfortably w/ O2 sats 95% on 1 L/min Minimal chest tube output  Plan: Continue routine early postop  Rexene Alberts, MD 11/07/2016 7:04 PM

## 2016-11-07 NOTE — H&P (Signed)
[address]       [place] [address]             [phone]      Cardiothoracic Surgery History and Physical   PCP is Gilford Rile, MD Referring Provider is Evans Lance, MD      Chief Complaint  Patient presents with ischemic cardiomyopathy and chronic systolic heart failure           HPI:  The patient is a 51 year old gentleman with hypertension, hyperlipidemia, CAD s/p multiple PCI/Stents and ischemic cardiomyopathy with an EF of 15-20% by echo 05/02/2016 when he presented to [facility] with a two week history of progressive shortness of breath and orthopnea. He was in afib/flutter and suffered a brady/PEA arrest, was intubated and started on pressors and transferred to [facility]. He was weaned off pressors and extubated and underwent cath and PCI with DES x 2 to the RCA and DES to the mid LAD. He has been on DAPT with Brilinta since. He was admitted in early April for palpitations and weakness and found to have frequent PVC's. An ICD was placed but unable to place an LV lead. Echo at that time showed an EF of 25%. He has been followed by Dr. Haroldine Laws. The patient says that he feels well now. He is walking with no shortness of breath as long as he takes his time. He denies fatigue and chest discomfort. He denies orthopnea and peripheral edema.       Past Medical History:  Diagnosis Date  . CAD (coronary artery disease) 2009   AMI in 12/2007 with PCI to LAD, staged PCI to  mid/distal RCA, NSTEMI in 02/2009 with BMS to LCx  . CHF (congestive heart failure) ([place])   . HLD (hyperlipidemia)   . HTN (hypertension)   . Ischemic cardiomyopathy    Admitted in 07/2010 with CHF exacerbation         Past Surgical History:  Procedure Laterality Date  . BIV ICD INSERTION CRT-D N/A 08/01/2016   Procedure: BiV ICD Insertion CRT-D;  Surgeon: Will Meredith Leeds, MD;  Location: [name] [facility];  Service: Cardiovascular;  Laterality: N/A;  . CARDIAC CATHETERIZATION  N/A 05/05/2016   Procedure: Right/Left Heart Cath and Coronary Angiography;  Surgeon: Jolaine Artist, MD;  Location: [place] [facility];  Service: Cardiovascular;  Laterality: N/A;  . CARDIAC CATHETERIZATION N/A 05/09/2016   Procedure: Coronary Stent Intervention;  Surgeon: Peter M Martinique, MD;  Location: [place] [facility];  Service: Cardiovascular;  Laterality: N/A;  . CARDIAC CATHETERIZATION N/A 05/09/2016   Procedure: Intravascular Pressure Wire/FFR Study;  Surgeon: Peter M Martinique, MD;  Location: [place] [facility];  Service: Cardiovascular;  Laterality: N/A;  . ELECTROPHYSIOLOGY STUDY N/A 06/24/2016   Procedure: Electrophysiology Study;  Surgeon: Deboraha Sprang, MD;  Location: [place] [facility];  Service: Cardiovascular;  Laterality: N/A;  . ICD IMPLANT N/A 06/24/2016   Procedure: POSSIBLE  ICD Implant;  Surgeon: Deboraha Sprang, MD;  Location: [place] [facility];  Service: Cardiovascular;  Laterality: N/A;  . LEFT HEART CATHETERIZATION WITH CORONARY ANGIOGRAM N/A 03/13/2011   Procedure: LEFT HEART CATHETERIZATION WITH CORONARY ANGIOGRAM;  Surgeon: Lorretta Harp, MD;  Location: [facility];  Service: Cardiovascular;  Laterality: N/A;         Family History  Problem Relation Age of Onset  . Coronary artery disease Father   . Hypertension Father   . Hypertension Sister   .  Coronary artery disease Brother   . Emphysema Brother   . Coronary artery disease Mother   . Hypertension Mother   . Emphysema Mother   . Hypertension Daughter   . Obesity Daughter   . Diabetes    . Hypertension    . Coronary artery disease      Social History       Social History  Substance Use Topics  . Smoking status: Former Smoker    Packs/day: 0.20    Years: 30.00    Types: Cigars    Quit date: 04/29/2016  . Smokeless tobacco: Never Used     Comment: Used to smoke cigarettes. Pt is currently smoking 3 black & mild cigars per day. He has cut down but never quit.  .  Alcohol use No          Current Outpatient Prescriptions  Medication Sig Dispense Refill  . amiodarone (PACERONE) 200 MG tablet Take 1 tablet (200 mg total) by mouth 2 (two) times daily. 60 tablet 0  . aspirin 81 MG chewable tablet Chew 1 tablet (81 mg total) by mouth daily. 30 tablet 6  . carvedilol (COREG) 3.125 MG tablet Take 1 tablet (3.125 mg total) by mouth 2 (two) times daily. 60 tablet 3  . furosemide (LASIX) 20 MG tablet Take 20mg  as needed for 3-4 pound weight gain in 24 hours 30 tablet 0  . ivabradine (CORLANOR) 5 MG TABS tablet Take 0.5 tablets (2.5 mg total) by mouth 2 (two) times daily with a meal. 60 tablet 0  . Multiple Vitamins-Minerals (ADULT GUMMY) CHEW Chew 2 tablets by mouth daily.    . ranolazine (RANEXA) 500 MG 12 hr tablet Take 1 tablet (500 mg total) by mouth 2 (two) times daily. 60 tablet 0  . ticagrelor (BRILINTA) 90 MG TABS tablet Take 1 tablet (90 mg total) by mouth 2 (two) times daily. 60 tablet 6   No current facility-administered medications for this visit.         Allergies  Allergen Reactions  . Plavix [Clopidogrel Bisulfate] Hives    Review of Systems  Constitutional: Negative for activity change, appetite change, fatigue, fever and unexpected weight change.  HENT: Negative.        Has not been to dentist in years. No pain in teeth or jaw.  Eyes: Negative.   Respiratory:       Shortness of breath with heavy exertion.  Cardiovascular: Negative for chest pain, palpitations and leg swelling.  Gastrointestinal: Negative.   Endocrine: Negative.   Genitourinary: Negative.   Musculoskeletal: Negative.   Skin: Negative.   Neurological: Negative.   Hematological: Negative.   Psychiatric/Behavioral: Negative.     BP 118/75 (BP Location: Right Arm, Patient Position: Sitting, Cuff Size: Large)   Pulse 69   Resp 16   Ht 5\' 5"  (1.651 m)   Wt 156 lb (70.8 kg)   SpO2 98% Comment: ON RA  BMI 25.96 kg/m  Physical Exam  Constitutional: He  is oriented to person, place, and time. He appears well-developed and well-nourished. No distress.  HENT:  Head: Normocephalic and atraumatic.  Mouth/Throat: Oropharynx is clear and moist.  Poor dentition  Eyes: EOM are normal. Pupils are equal, round, and reactive to light.  Neck: Normal range of motion. Neck supple. No JVD present. No thyromegaly present.  Cardiovascular: Normal rate, regular rhythm, normal heart sounds and intact distal pulses.   No murmur heard. Pulmonary/Chest: Effort normal and breath sounds normal. No respiratory distress. He has  no wheezes. He has no rales.  ICD generator in left anterior chest wall. Incision healed with no sign of infection.  Abdominal: Soft. Bowel sounds are normal. He exhibits no distension. There is no tenderness.  Musculoskeletal: Normal range of motion. He exhibits no edema.  Lymphadenopathy:    He has no cervical adenopathy.  Neurological: He is alert and oriented to person, place, and time. He has normal strength. No cranial nerve deficit or sensory deficit.  Skin: Skin is warm and dry.  Psychiatric: He has a normal mood and affect.    Diagnostic Tests:  *[facility]* [address] [place], [place] [address] [phone]  ------------------------------------------------------------------- Transthoracic Echocardiography  Patient: Clinten, Howk MR #: [id number] Study Date: 07/29/2016 Gender: M Age: 42 Height: 165.1 cm Weight: 70.3 kg BSA: 1.81 m^2 Pt. Status: Room: 2W35C  PERFORMING [facility], Inpatient ATTENDING Camara Dalton, Renee Cala Bradford, Renee Jeani Hawking ADMITTING Vianne Bulls SONOGRAPHER Chelsea Androw  cc:  ------------------------------------------------------------------- LV EF:  25%  ------------------------------------------------------------------- Indications: Cardiomyopathy - ischemic 414.8.  ------------------------------------------------------------------- History: PMH: Chest pain STEMI Coronary artery disease. Congestive heart failure. Risk factors: Hypertension. Dyslipidemia.  ------------------------------------------------------------------- Study Conclusions  - Left ventricle: The cavity size was severely dilated. Wall thickness was normal. The estimated ejection fraction was 25%. Diffuse hypokinesis. Left ventricular diastolic function parameters were normal. - Aortic valve: There was mild regurgitation. - Mitral valve: There was mild regurgitation. - Left atrium: The atrium was moderately dilated. - Atrial septum: No defect or patent foramen ovale was identified.  ------------------------------------------------------------------- Study data: Comparison was made to the study of 05/02/2016. Study status: Routine. Procedure: The patient reported no pain pre or post test. Transthoracic echocardiography. Image quality was adequate. Study completion: There were no complications. Transthoracic echocardiography. M-mode, complete 2D, spectral Doppler, and color Doppler. Birthdate: Patient birthdate: 12/12/1965. Age: Patient is 51 yr old. Sex: Gender: male. BMI: 25.8 kg/m^2. Blood pressure: 93/58 Patient status: Inpatient. Study date: Study date: 07/29/2016. Study time: 12:46 PM. Location: Bedside.  -------------------------------------------------------------------  ------------------------------------------------------------------- Left ventricle: The cavity size was severely dilated. Wall thickness was normal. The estimated ejection fraction was 25%. Diffuse hypokinesis. The transmitral flow pattern was normal. The deceleration time of the early transmitral flow velocity was normal. The  pulmonary vein flow pattern was normal. The tissue Doppler parameters were normal. Left ventricular diastolic function parameters were normal.  ------------------------------------------------------------------- Aortic valve: Trileaflet. Doppler: There was no stenosis. There was mild regurgitation.  ------------------------------------------------------------------- Aorta: The aorta was normal, not dilated, and non-diseased.  ------------------------------------------------------------------- Mitral valve: Doppler: There was mild regurgitation. Peak gradient (D): 3 mm Hg.  ------------------------------------------------------------------- Left atrium: The atrium was moderately dilated.  ------------------------------------------------------------------- Atrial septum: No defect or patent foramen ovale was identified.  ------------------------------------------------------------------- Pulmonic valve: Doppler: There was trivial regurgitation.  ------------------------------------------------------------------- Tricuspid valve: Doppler: There was mild regurgitation.  ------------------------------------------------------------------- Right atrium: The atrium was normal in size.  ------------------------------------------------------------------- Pericardium: The pericardium was normal in appearance.  ------------------------------------------------------------------- Systemic veins: Inferior vena cava: The vessel was normal in size. The respirophasic diameter changes were in the normal range (>= 50%), consistent with normal central venous pressure.  ------------------------------------------------------------------- Post procedure conclusions Ascending Aorta:  - The aorta was normal, not dilated, and non-diseased.  ------------------------------------------------------------------- Measurements  Left ventricle  Value Reference Stroke volume, 2D 60 ml ---------- Stroke volume/bsa, 2D 33 ml/m^2 ---------- LV end-diastolic volume, 1-p Z0S 923 ml ---------- LV end-systolic volume, 1-p R0Q 762 ml ---------- LV ejection fraction, 1-p A4C 24 % ---------- LV end-diastolic  volume/bsa, 1-p A4C 161 ml/m^2 ---------- LV end-systolic volume/bsa, 1-p M0N 127 ml/m^2 ---------- LV end-diastolic volume, 2-p 027 ml ---------- LV end-systolic volume, 2-p 253 ml ---------- LV ejection fraction, 2-p 22 % ---------- Stroke volume, 2-p 63 ml ---------- LV end-diastolic volume/bsa, 2-p 664 ml/m^2 ---------- LV end-systolic volume/bsa, 2-p 403 ml/m^2 ---------- Stroke volume/bsa, 2-p 34.8 ml/m^2 ---------- LV e&', lateral 7.48 cm/s ---------- LV E/e&', lateral 11.8 ---------- LV e&', medial 5.12 cm/s ---------- LV E/e&', medial 17.25 ---------- LV e&', average 6.3 cm/s ---------- LV E/e&', average 14.02 ----------  LVOT Value Reference LVOT ID, S 18 mm ---------- LVOT area 2.54 cm^2 ---------- LVOT peak velocity, S 124 cm/s ---------- LVOT mean velocity, S 87.9 cm/s ---------- LVOT VTI, S 23.5 cm ---------- LVOT peak gradient, S 6 mm Hg ----------  Aortic valve Value  Reference Aortic regurg pressure half-time 340 ms ----------  Aorta Value Reference Aortic root ID, ED 33 mm ----------  Left atrium Value Reference LA ID, A-P, ES 48 mm ---------- LA ID/bsa, A-P (H) 2.65 cm/m^2 <=2.2 LA volume, S 74.3 ml ---------- LA volume/bsa, S 41.1 ml/m^2 ---------- LA volume, ES, 1-p A4C 82.4 ml ---------- LA volume/bsa, ES, 1-p A4C 45.6 ml/m^2 ---------- LA volume, ES, 1-p A2C 65.7 ml ---------- LA volume/bsa, ES, 1-p A2C 36.3 ml/m^2 ----------  Mitral valve Value Reference Mitral E-wave peak velocity 88.3 cm/s ---------- Mitral A-wave peak velocity 66.9 cm/s ---------- Mitral deceleration time 158 ms 150 - 230 Mitral peak gradient, D 3 mm Hg ---------- Mitral E/A ratio, peak 1.3 ----------  Right atrium Value Reference RA ID, S-I, ES, A4C 37.2 mm 34 - 49 RA area, ES, A4C 12.4 cm^2 8.3 - 19.5 RA volume, ES, A/L 34.1 ml ---------- RA volume/bsa, ES, A/L 18.9 ml/m^2 ----------  Systemic veins Value Reference Estimated CVP 15 mm Hg ----------  Right ventricle Value Reference TAPSE 23.5 mm ---------- RV s&', lateral, S 11.6 cm/s ----------  Legend: (L) and  (H) mark values outside specified reference range.  ------------------------------------------------------------------- Prepared and Electronically Authenticated by  Jenkins Rouge, M.D. 2018-04-06T14:26:39    ECG from 08/10/2016 shows sinus rhythm with 1st degree AV block, LBBB, QRS 156 ms.   Impression:  He has ischemic cardiomyopathy with an EF of 25%, ECG with LBBB with QRS 156 ms and has an ICD in place but could not insert a transvenous LV lead. He is doing well but I agree with benefit of a BiV ICD for this patient. He had 3 DES placed on 05/09/2016 so he needed to stay on Brilinta for 6 months before stopping it for surgical placement of an LV epicardial lead. He has now completed that course and we feel that it is safe to stop the Brilinta for surgery.  I discussed the surgical procedure with the patient and his wife including alternative of no LV lead, benefits and risk including but not limited to bleeding, blood transfusion, infection and possible need for removal of the ICD system if it got infected, injury to the heart and system malfunction in the future requiring revision. He understands and agrees to proceed.  Plan:  Insertion of an LV epicardial pacing lead via left mini-thoracotomy and revision of his ICD system to BiV.    Gaye Pollack, MD [facility] [phone]

## 2016-11-08 ENCOUNTER — Inpatient Hospital Stay (HOSPITAL_COMMUNITY): Payer: Medicare HMO

## 2016-11-08 ENCOUNTER — Encounter (HOSPITAL_COMMUNITY): Payer: Self-pay | Admitting: Surgery

## 2016-11-08 LAB — BASIC METABOLIC PANEL
ANION GAP: 7 (ref 5–15)
BUN: 14 mg/dL (ref 6–20)
CALCIUM: 8.5 mg/dL — AB (ref 8.9–10.3)
CO2: 24 mmol/L (ref 22–32)
Chloride: 101 mmol/L (ref 101–111)
Creatinine, Ser: 1.28 mg/dL — ABNORMAL HIGH (ref 0.61–1.24)
GLUCOSE: 128 mg/dL — AB (ref 65–99)
POTASSIUM: 4 mmol/L (ref 3.5–5.1)
SODIUM: 132 mmol/L — AB (ref 135–145)

## 2016-11-08 LAB — GLUCOSE, CAPILLARY
GLUCOSE-CAPILLARY: 151 mg/dL — AB (ref 65–99)
GLUCOSE-CAPILLARY: 133 mg/dL — AB (ref 65–99)
GLUCOSE-CAPILLARY: 168 mg/dL — AB (ref 65–99)
Glucose-Capillary: 132 mg/dL — ABNORMAL HIGH (ref 65–99)

## 2016-11-08 LAB — CBC
HCT: 35.7 % — ABNORMAL LOW (ref 39.0–52.0)
Hemoglobin: 11.6 g/dL — ABNORMAL LOW (ref 13.0–17.0)
MCH: 26.8 pg (ref 26.0–34.0)
MCHC: 32.5 g/dL (ref 30.0–36.0)
MCV: 82.4 fL (ref 78.0–100.0)
PLATELETS: 271 10*3/uL (ref 150–400)
RBC: 4.33 MIL/uL (ref 4.22–5.81)
RDW: 14.2 % (ref 11.5–15.5)
WBC: 17.9 10*3/uL — AB (ref 4.0–10.5)

## 2016-11-08 NOTE — Plan of Care (Signed)
Problem: Cardiac: Goal: Hemodynamic stability will improve Outcome: Completed/Met Date Met: 11/08/16 Hemo WNL

## 2016-11-08 NOTE — Progress Notes (Signed)
Morning labs sent via tube at 03:55, no results as of 05:00. Called to  verify labs had been received and was told they had not. Will recollect

## 2016-11-08 NOTE — Discharge Instructions (Signed)
Cardiomyopathy, Adult Cardiomyopathy is a long-term (chronic) disease of the heart muscle. The disease makes the heart muscle thick, weak, or stiff. As a result, the heart works harder to pump blood. Over time, cardiomyopathy can lead to heart failure. There are several types of cardiomyopathy:  Dilated cardiomyopathy. This type causes the ventricles to become weak and stretched out.  Hypertrophic cardiomyopathy. This type causes the heart muscle to thicken.  Restrictive cardiomyopathy. This type causes the heart muscle to become stiff.  Ischemic cardiomyopathy. This type involves narrowing arteries that cause the walls of the heart to get thinner.  Peripartum cardiomyopathy. This type occurs during pregnancy or shortly after pregnancy.  What are the causes? This condition may be caused by:  A gene that is passed down (inherited) from a family member.  A medical condition that damages the heart, such as: ? Diabetes. ? High blood pressure. ? Viral infection of the heart. ? Heart attack. ? Coronary heart disease.  Alcoholism.  Using illegal drugs or some prescription medicines.  Pregnancy.  Your body absorbing and storing too much iron (hemochromatosis).  Autoimmune diseases, connective tissue diseases, endocrine diseases, and muscle diseases.  Cancer treatments.  Buildup of proteins in your organs (amyloidosis), or inflammation in your organs (sarcoidosis).  Often, the cause is not known. What increases the risk? This condition is more likely to develop in people who:  Have a family history of cardiomyopathy or other heart problems.  Are overweight or obese.  Use illegal drugs.  Abuse alcohol.  Have a medical condition that damages the heart.  What are the signs or symptoms? Symptoms of this condition include:  Shortness of breath, especially during activity.  Fatigue.  An irregular heartbeat and heart  murmurs.  Dizziness.  Light-headedness.  Fainting.  Chest pain.  Coughing.  Swelling in the lower legs, ankles, feet, abdomen, and neck veins.  Often, people with this condition have no symptoms. How is this diagnosed? This condition is diagnosed based on:  Your symptoms and medical history.  A physical exam.  Tests.  Tests may include:  Blood tests.  Imaging studies of your heart, such as: ? X-rays. ? An echocardiogram. ? An MRI.  An electrocardiogram (ECG). This records your heart's electrical activity.  A test in which you wear a portable device (event monitor) to record your heart's electrical activity while you go about your day.  A stress test. This monitors your heart's activity while exercising.  Cardiac catheterization. This procedure checks the blood pressure and blood flow in your heart.  An angiogram. This is an injection of dye into your arteries before imaging studies are taken.  Heart tissue biopsy. This removes a sample of heart tissue for examination.  How is this treated?  Treatment for this condition depends on the type of cardiomyopathy you have and the severity of your symptoms. If you do not have symptoms, you may not need treatment. If you need treatment, it may include:  Lifestyle changes, such as: ? Eating a heart-healthy diet that includes plenty of fruits, vegetables, and whole grains, and cutting down on salt (sodium). ? Maintaining a healthy weight, and losing weight, if needed. ? Getting regular exercise. ? Quitting smoking, if you smoke. ? Avoiding alcohol. ? Medicine to:  Lower your blood pressure.  Slow down your heart rate.  Keep your heart beating in a steady rhythm.  Clear excess fluids from your body.  Prevent blood clots.  Balance minerals (electrolytes) in your body and get rid of extra  sodium in your body.  Reduce inflammation.  Strengthen your heartbeat. ? Surgery to:  Repair a defect.  Remove  thickened tissue.  Destroy tissues in the area of abnormal electrical activity (ablation).  Implant a device to treat serious heart rhythm problems (implantable cardioverter-defibrillator, or ICD), or a pacemaker.  Replace your heart (heart transplant) if all other treatments have failed (end stage).  Other treatments may include cardiac resynchronization therapy (CRT) or a left ventricular assist device (LVAD). Follow these instructions at home: Lifestyle  Eat a heart-healthy diet. Work with your health care provider or a registered dietitian to learn about healthy eating options.  Maintain a healthy weight.  Stay physically active. Ask your health care provider to suggest some activities that are good for you.  Do not use any products that contain nicotine or tobacco, such as cigarettes and e-cigarettes. If you need help quitting, ask your health care provider.  Limit alcohol intake to no more than one drink per day for nonpregnant women and no more than two drinks per day for men. One drink equals 12 oz of beer, 5 oz of wine, or 1 oz of hard liquor.  Try to get at least 7 hours of sleep each night.  Find healthy ways to manage stress. General instructions  Take over-the-counter and prescription medicines only as told by your health care provider. Some medicines can be dangerous for your heart.  Tell all health care providers, including your dentist, that you have cardiomyopathy. When you visit the dentist or have surgery, ask your health care provider if you need antibiotics before having dental care or before the surgery.  Ask your health care provider if you should wear a medical identification bracelet. This may be important if you have a pacemaker or a defibrillator.  Make sure you get all recommended vaccinations and an annual flu shot.  Work closely with your health care provider to manage any long-lasting (chronic) conditions.  Keep all follow-up visits as told by your  health care provider. This is important, even if you do not have any symptoms. Your health care provider may need to make sure your condition is not getting worse. How is this prevented? This condition cannot be prevented. Parents, siblings, and children of people with this condition may be at risk for the condition. It is a good idea for them to get screened for the condition because it is best when cardiomyopathy is found early. Screening is done with an ECG and echocardiogram. People who want to start a family may also want to meet with a genetic counselor to discuss the risk of having a child with cardiomyopathy. Contact a health care provider if:  Your symptoms get worse.  You have new symptoms. Get help right away if:  You have severe chest pain.  You have shortness of breath.  You cough up pink, bubbly material.  You have sudden sweating.  You feel nauseous and you vomit.  You suddenly become light-headed or dizzy.  You feel your heart beating very quickly.  It feels like your heart is skipping beats. These symptoms may represent a serious problem that is an emergency. Do not wait to see if the symptoms will go away. Get medical help right away. Call your local emergency services (911 in the [place] Do not drive yourself to the hospital. This information is not intended to replace advice given to you by your health care provider. Make sure you discuss any questions you have with your health care  provider. Document Released: 06/24/2004 Document Revised: 12/08/2015 Document Reviewed: 10/12/2015 Elsevier Interactive Patient Education  [employer].

## 2016-11-08 NOTE — Discharge Summary (Signed)
Physician Discharge Summary  Patient ID: Martavious Hartel MRN: [id number] DOB/AGE: [birth year] 51 y.o.  Admit date: 11/07/2016 Discharge date: 11/11/2016  Admission Diagnoses: Patient Active Problem List  Diagnosis Date Noted . Ischemic cardiomyopathy 11/07/2016 . NSVT (nonsustained ventricular tachycardia) ([place]) 08/02/2016 . PVC (premature ventricular contraction) 08/01/2016 . Near syncope 07/28/2016 . Postural dizziness with presyncope  . Chest pain 07/27/2016 . VT (ventricular tachycardia) ([place]) 07/27/2016 . AKI (acute kidney injury) ([place]) 07/27/2016 . CKD (chronic kidney disease), stage II 05/13/2016 . Chronic anemia 05/13/2016 . Hypokalemia 05/13/2016 . PAF (paroxysmal atrial fibrillation) ([place])  . CAD (coronary artery disease), native coronary artery 03/15/2011 . Hypertension, essential 03/15/2011 . Hyperlipidemia 03/15/2011 . Chronic systolic HF (heart failure) ([place]) 03/15/2011 . STEMI 2009 (anterior), 2010 (lateral), and 2012 (inferior) 03/14/2011   Discharge Diagnoses:  Active Problems:   Ischemic cardiomyopathy   Discharged Condition: good  HPI: The patient is a 51 year old gentleman with hypertension, hyperlipidemia, CAD s/p multiple PCI/Stents and ischemic cardiomyopathy with an EF of 15-20% by echo 05/02/2016 when he presented to [facility] with a two week history of progressive shortness of breath and orthopnea. He was in afib/flutter and suffered a brady/PEA arrest, was intubated and started on pressors and transferred to [facility]. He was weaned off pressors and extubated and underwent cath and PCI with DES x 2 to the RCA and DES to the mid LAD. He has been on DAPT with Brilinta since. He was admitted in early April for palpitations and weakness and found to have frequent PVC's. An ICD was placed but unable to place an LV lead. Echo at that time showed an EF of 25%. He has been followed by Dr. Haroldine Laws. The patient says that he feels well now. He is walking with no shortness of breath  as long as he takes his time. He denies fatigue and chest discomfort. He denies orthopnea and peripheral edema.   Hospital Course: Mr. Simkin underwent an epicardial lead placement on 7/16 with Dr. Cyndia Bent. He tolerated the procedure well and was transferred to the ICU. POD 1 we removed his chest tube and foley catheter. We discontinued the PCA and transitioned to oral pain medication. He was tolerating fluids, therefore we discontinued his IV fluids and central line. He was transferred to stepdown for continued care. Today, he is tolerating room air, his chest xray is stable, he is ambulating with limited assistance and is ready for discharge. He will be placed on eliquis for [facility] RX. with stents per cardiology recommendations.   Consults: none  Significant Diagnostic Studies:  CLINICAL DATA:  Thoracotomy.  Chest tube  EXAM: PORTABLE CHEST 1 VIEW  COMPARISON:  11/07/2016  FINDINGS: Left chest tube remains in place. No pneumothorax. Central line in the cavoatrial junction region of the SVC. Dual lead AICD unchanged in position  Cardiac enlargement with vascular congestion. Negative for edema or effusion.  Progression of right lower lobe atelectasis.  IMPRESSION: Cardiac enlargement with pulmonary vascular congestion  Increase in right lower lobe atelectasis.   Electronically Signed   By: Franchot Gallo M.D.   On: 11/08/2016 07:34  Treatments: CARDIOTHORACIC SURGERY OPERATIVE NOTE  11/07/2016 Glean Salvo [id number]  Surgeon:  Gaye Pollack, MD  First Assistant: none   Preoperative Diagnosis:  Ischemic cardiomyopathy with chronic systolic heart failure.  Postoperative Diagnosis: same  Procedure:  1. Left anterolateral thoracotomy 2. Insertion of 2 left ventricular epicardial pacing leads 3. Revision of Biventricular pacing system  Anesthesia:  General Endotracheal  Discharge Exam: Blood pressure 101/60,  pulse 69, temperature 98.1 F (36.7  C), temperature source Oral, resp. rate 16, height 5\' 5"  (1.651 m), weight 173 lb 4.8 oz (78.6 kg), SpO2 96 %.    General appearance: alert and cooperative Neurologic: intact Heart: regular rate and rhythm, S1, S2 normal, no murmur, click, rub or gallop Lungs: clear to auscultation bilaterally Extremities: extremities normal, atraumatic, no cyanosis or edema Wound: incisions ok Disposition: 01-Home or Self Care   Allergies as of 11/11/2016      Reactions   Plavix [clopidogrel Bisulfate] Hives      Medication List    STOP taking these medications   ticagrelor 90 MG Tabs tablet Commonly known as:  BRILINTA     TAKE these medications   ADULT GUMMY Chew Chew 2 tablets by mouth daily.   amiodarone 200 MG tablet Commonly known as:  PACERONE Take 200 mg by mouth 2 (two) times daily. What changed:  Another medication with the same name was removed. Continue taking this medication, and follow the directions you see here.   apixaban 5 MG Tabs tablet Commonly known as:  ELIQUIS Take 1 tablet (5 mg total) by mouth 2 (two) times daily.   aspirin 81 MG chewable tablet Chew 1 tablet (81 mg total) by mouth daily.   carvedilol 3.125 MG tablet Commonly known as:  COREG Take 1 tablet (3.125 mg total) by mouth 2 (two) times daily.   furosemide 40 MG tablet Commonly known as:  LASIX Take 40 mg by mouth 2 (two) times daily. What changed:  Another medication with the same name was removed. Continue taking this medication, and follow the directions you see here.   hydrocortisone cream 1 % Apply 1 application topically 2 (two) times daily as needed for itching.   ivabradine 5 MG Tabs tablet Commonly known as:  CORLANOR Take 0.5 tablets (2.5 mg total) by mouth 2 (two) times daily with a meal.   LINZESS 290 MCG Caps capsule Generic drug:  linaclotide Take 290 mcg by mouth daily as needed (constipation).   NASAL SPRAY 12 HOUR NA Place 1 spray into both nostrils at bedtime.    oxyCODONE 5 MG immediate release tablet Commonly known as:  Oxy IR/ROXICODONE Take 1-2 tablets (5-10 mg total) by mouth every 6 (six) hours as needed for severe pain.   ranolazine 500 MG 12 hr tablet Commonly known as:  RANEXA Take 1 tablet (500 mg total) by mouth 2 (two) times daily.      Follow-up Information    Gaye Pollack, MD Follow up.   Specialty:  Cardiothoracic Surgery Why:  Your appointment is on Aug 8th at 1:00pm. Please arrive at 12:30pm for a chest xray located at [facility] which is on the first floor of our building.  Contact information: [address] [place] [address] [phone]        nursing appointment Follow up.   Why:  Please arrive at 10:30am on July 25th for a suture removal appointment.  Contact information: Dr. Vivi Martens office       Evans Lance, MD. Call in 1 day(s).   Specialty:  Cardiology Contact information: [address] [address] [phone]        Raina Mina., MD. Call in 1 day(s).   Specialty:  Internal Medicine Contact information: [address] [place] [address] [phone]           Signed: John Giovanni 11/11/2016, 4:20 PM

## 2016-11-08 NOTE — Care Management Note (Addendum)
Case Management Note  Patient Details  Name: Mavis Fichera MRN: [id number] Date of Birth: 1965-08-24  Subjective/Objective:   Form home with wife, pta indep, he has PCP  , Dr. Bea Graff, he has medication coverage, he has had [facility] services in the past, wife states if he needs [facility] services they would like [facility]. He is POD 1  LV Epicardial pacing lead placement , left mini thoracotomy,  Will dc chest tube today, dc pca and ivf, transfer to SDU, will plan to send home on ASA and Plavix, since he was having SOB with Brilinta per MD note.      7/18 [place], BSN - he is allergic to palvix and had SOB with brilinta, MD will look into other antiplatelet agent other than ASA, awaiting transfer.              Action/Plan: NCM will follow for dc needs.   Expected Discharge Date:                  Expected Discharge Plan:     In-House Referral:     Discharge planning Services  CM Consult  Post Acute Care Choice:    Choice offered to:     DME Arranged:    DME Agency:     HH Arranged:    HH Agency:     Status of Service:  In process, will continue to follow  If discussed at Long Length of Stay Meetings, dates discussed:    Additional Comments:  Zenon Mayo, RN 11/08/2016, 1:20 PM

## 2016-11-08 NOTE — Progress Notes (Signed)
CT surgery p.m. Rounds  Patient comfortable after left VATS for epicardial pacing lead placement Chest tube out Breathing comfortably Feels better with biventricular pacing

## 2016-11-08 NOTE — Progress Notes (Signed)
1 Day Post-Op Procedure(s) (LRB): LV EPICARDIAL PACING LEAD PLACEMENT VIA LEFT MINI THORACOTOMY (Left) Subjective:   Pain under control. No shortness of breath  Objective: Vital signs in last 24 hours: Temp:  [97.5 F (36.4 C)-98 F (36.7 C)] 98 F (36.7 C) (07/17 0400) Pulse Rate:  [58-73] 73 (07/17 0700) Cardiac Rhythm: Normal sinus rhythm;Sinus bradycardia (07/17 0400) Resp:  [12-24] 19 (07/17 0700) BP: (97-138)/(51-89) 127/89 (07/17 0700) SpO2:  [89 %-100 %] 96 % (07/17 0700) Arterial Line BP: (117-143)/(62-78) 117/62 (07/16 1700) FiO2 (%):  [30 %] 30 % (07/16 1120) Weight:  [77.2 kg (170 lb 3.1 oz)] 77.2 kg (170 lb 3.1 oz) (07/17 0600)  Hemodynamic parameters for last 24 hours:    Intake/Output from previous day: 07/16 0701 - 07/17 0700 In: 4072.2 [P.O.:840; I.V.:2932.2; IV Piggyback:300] Out: 2115 [Urine:1775; Blood:20; Chest Tube:320] Intake/Output this shift: No intake/output data recorded.  General appearance: alert and cooperative Neurologic: intact Heart: regular rate and rhythm, S1, S2 normal, no murmur, click, rub or gallop Lungs: clear to auscultation bilaterally Wound: dressings dry chest tube output low, serosanguinous.  Lab Results:  Recent Labs  11/08/16 0500  WBC 17.9*  HGB 11.6*  HCT 35.7*  PLT 271   BMET:  Recent Labs  11/08/16 0500  NA 132*  K 4.0  CL 101  CO2 24  GLUCOSE 128*  BUN 14  CREATININE 1.28*  CALCIUM 8.5*    PT/INR: No results for input(s): LABPROT, INR in the last 72 hours. ABG    Component Value Date/Time   PHART 7.475 (H) 11/03/2016 1142   HCO3 22.6 11/03/2016 1142   TCO2 21 05/05/2016 0827   ACIDBASEDEF 0.6 11/03/2016 1142   O2SAT 98.2 11/03/2016 1142   CBG (last 3)   Recent Labs  11/07/16 1650 11/07/16 2258 11/08/16 0354  GLUCAP 171* 151* 133*   CXR ok  Assessment/Plan: S/P Procedure(s) (LRB): LV EPICARDIAL PACING LEAD PLACEMENT VIA LEFT MINI THORACOTOMY (Left)  He is hemodynamically stable  POD 1  Will remove chest tube, foley DC PCA and use oral meds for pain DC IVF and central line Transfer to 4E stepdown Ambulate, IS CXR in am Will plan to send home on ASA and Plavix since he was having SOB with Brilinta.   LOS: 1 day    Gaye Pollack 11/08/2016

## 2016-11-08 NOTE — Progress Notes (Signed)
PCA fentanyl pump discontinued.  10 mL of Fentanyl wasted in the sink, witnessed by Blythe Stanford.

## 2016-11-09 ENCOUNTER — Encounter (HOSPITAL_COMMUNITY): Payer: Self-pay | Admitting: *Deleted

## 2016-11-09 ENCOUNTER — Inpatient Hospital Stay (HOSPITAL_COMMUNITY): Payer: Medicare HMO

## 2016-11-09 NOTE — Progress Notes (Signed)
2 Days Post-Op Procedure(s) (LRB): LV EPICARDIAL PACING LEAD PLACEMENT VIA LEFT MINI THORACOTOMY (Left) Subjective:  Left chest incisional soreness but otherwise feels ok. Ambulating well. No SOB.  Objective: Vital signs in last 24 hours: Temp:  [97.5 F (36.4 C)-97.9 F (36.6 C)] 97.5 F (36.4 C) (07/18 0700) Pulse Rate:  [66-85] 67 (07/18 0800) Cardiac Rhythm: Ventricular paced (07/18 0000) Resp:  [13-27] 20 (07/18 0800) BP: (111-146)/(70-92) 112/75 (07/18 0800) SpO2:  [88 %-97 %] 92 % (07/18 0800) Weight:  [77.9 kg (171 lb 11.8 oz)] 77.9 kg (171 lb 11.8 oz) (07/18 0300)  Hemodynamic parameters for last 24 hours:    Intake/Output from previous day: 07/17 0701 - 07/18 0700 In: 780 [P.O.:720; I.V.:10; IV Piggyback:50] Out: 765 [Urine:765] Intake/Output this shift: No intake/output data recorded.  General appearance: alert and cooperative Neurologic: intact Heart: regular rate and rhythm, S1, S2 normal, no murmur, click, rub or gallop Lungs: clear to auscultation bilaterally Extremities: extremities normal, atraumatic, no cyanosis or edema Wound: dressings dry  Lab Results:  Recent Labs  11/08/16 0500  WBC 17.9*  HGB 11.6*  HCT 35.7*  PLT 271   BMET:  Recent Labs  11/08/16 0500  NA 132*  K 4.0  CL 101  CO2 24  GLUCOSE 128*  BUN 14  CREATININE 1.28*  CALCIUM 8.5*    PT/INR: No results for input(s): LABPROT, INR in the last 72 hours. ABG    Component Value Date/Time   PHART 7.475 (H) 11/03/2016 1142   HCO3 22.6 11/03/2016 1142   TCO2 21 05/05/2016 0827   ACIDBASEDEF 0.6 11/03/2016 1142   O2SAT 98.2 11/03/2016 1142   CBG (last 3)   Recent Labs  11/08/16 0354 11/08/16 0746 11/08/16 1139  GLUCAP 133* 132* 168*   CXR: mild interstitial edema.  Assessment/Plan: S/P Procedure(s) (LRB): LV EPICARDIAL PACING LEAD PLACEMENT VIA LEFT MINI THORACOTOMY (Left)  He is hemodynamically stable in paced rhythm.  Continue preop heart failure  meds  Ambulate, IS  He is allergic to Plavix with rash and had SOB with Brilinta so will have to discuss what antiplatelet agent other than ASA to use for him for stents.  Awaiting bed on 4E.   LOS: 2 days    Gaye Pollack 11/09/2016

## 2016-11-09 NOTE — Plan of Care (Signed)
Problem: Pain Managment: Goal: General experience of comfort will improve Outcome: Progressing Patient continues to experience pain at surgical site.  Pain controlled with PRN medications.   Problem: Activity: Goal: Risk for activity intolerance will decrease Outcome: Progressing Patient ambulating well with no complaints.   Problem: Bowel/Gastric: Goal: Will not experience complications related to bowel motility Outcome: Not Progressing Patient has not had BM since admission.  Medications are scheduled and patient is ambulating.  Problem: Skin Integrity: Goal: Wound healing without signs and symptoms infection will improve Outcome: Progressing Sanguinous drainage from incision site.

## 2016-11-10 ENCOUNTER — Encounter: Payer: Medicare HMO | Admitting: Internal Medicine

## 2016-11-10 MED ORDER — CHLORPROMAZINE HCL 10 MG PO TABS
10.0000 mg | ORAL_TABLET | Freq: Three times a day (TID) | ORAL | Status: DC | PRN
  Administered 2016-11-10: 10 mg via ORAL
  Filled 2016-11-10 (×3): qty 1

## 2016-11-10 NOTE — Progress Notes (Addendum)
      [address]       [place] [address]             [phone]      3 Days Post-Op Procedure(s) (LRB): LV EPICARDIAL PACING LEAD PLACEMENT VIA LEFT MINI THORACOTOMY (Left) Subjective: Feels okay this morning. Has the hiccups which is uncomfortable.   Objective: Vital signs in last 24 hours: Temp:  [97.7 F (36.5 C)-98.6 F (37 C)] 98.1 F (36.7 C) (07/19 0651) Pulse Rate:  [49-82] 80 (07/19 0651) Cardiac Rhythm: Ventricular paced;Bundle branch block (07/19 0700) Resp:  [13-27] 15 (07/19 0651) BP: (102-131)/(61-89) 131/67 (07/19 0651) SpO2:  [82 %-94 %] 92 % (07/19 0651)     Intake/Output from previous day: 07/18 0701 - 07/19 0700 In: 480 [P.O.:480] Out: 151 [Urine:150; Stool:1] Intake/Output this shift: No intake/output data recorded.  General appearance: alert, cooperative and no distress Heart: regular rate and rhythm, S1, S2 normal, no murmur, click, rub or gallop Lungs: clear to auscultation bilaterally Abdomen: soft, non-tender; bowel sounds normal; no masses,  no organomegaly Extremities: extremities normal, atraumatic, no cyanosis or edema Wound: clean and dry  Lab Results:  Recent Labs  11/08/16 0500  WBC 17.9*  HGB 11.6*  HCT 35.7*  PLT 271   BMET:  Recent Labs  11/08/16 0500  NA 132*  K 4.0  CL 101  CO2 24  GLUCOSE 128*  BUN 14  CREATININE 1.28*  CALCIUM 8.5*    PT/INR: No results for input(s): LABPROT, INR in the last 72 hours. ABG    Component Value Date/Time   PHART 7.475 (H) 11/03/2016 1142   HCO3 22.6 11/03/2016 1142   TCO2 21 05/05/2016 0827   ACIDBASEDEF 0.6 11/03/2016 1142   O2SAT 98.2 11/03/2016 1142   CBG (last 3)   Recent Labs  11/08/16 0354 11/08/16 0746 11/08/16 1139  GLUCAP 133* 132* 168*    Assessment/Plan: S/P Procedure(s) (LRB): LV EPICARDIAL PACING LEAD PLACEMENT VIA LEFT MINI THORACOTOMY (Left)  1. CV-Hemodynamically stable in a paced rhythm, on pre-op heart failure  medications 2. Pulm-CXR yesterday showed removal of left chest tube, no pneumothorax, stable right base subsegmental atelectasis 3. Renal-creatinine trending down, electrolytes okay 4. H and H stable 5. Previous stents-was on Brilinta but was having SOB and allergic to Plavix therefore will need to discuss antiplatelet agents.  Plan: Wean oxygen as tolerated. Work on IS and pulm toilet. Walk in the halls today. Home soon.     LOS: 3 days    David [name] 11/10/2016  Chart reviewed, patient examined, agree with above.

## 2016-11-11 MED ORDER — APIXABAN 5 MG PO TABS: 5.0000 mg | ORAL_TABLET | Freq: Two times a day (BID) | ORAL | 1 refills | Status: DC

## 2016-11-11 MED ORDER — OXYCODONE HCL 5 MG PO TABS: 5.0000 mg | ORAL_TABLET | Freq: Four times a day (QID) | ORAL | 0 refills | Status: DC | PRN

## 2016-11-11 MED ORDER — TRAMADOL HCL 50 MG PO TABS: 50.0000 mg | ORAL_TABLET | Freq: Four times a day (QID) | ORAL | 0 refills | Status: DC | PRN

## 2016-11-11 MED ORDER — APIXABAN 5 MG PO TABS: 5.0000 mg | ORAL_TABLET | Freq: Two times a day (BID) | ORAL | Status: DC

## 2016-11-11 NOTE — Care Management Note (Signed)
Case Management Note Original Note Created Zenon Mayo, RN 11/08/2016, 1:20 PM   Patient Details  Name: David [name] MRN: [id number] Date of Birth: 19-Feb-1966  Subjective/Objective:   Form home with wife, pta indep, he has PCP  , Dr. Bea Graff, he has medication coverage, he has had [facility] services in the past, wife states if he needs [facility] services they would like [facility]. He is POD 1  LV Epicardial pacing lead placement , left mini thoracotomy,  Will dc chest tube today, dc pca and ivf, transfer to SDU, will plan to send home on ASA and Plavix, since he was having SOB with Brilinta per MD note.      7/18 [place], BSN - he is allergic to palvix and had SOB with brilinta, MD will look into other antiplatelet agent other than ASA, awaiting transfer.              Action/Plan: NCM will follow for dc needs.   Expected Discharge Date:  11/11/16               Expected Discharge Plan:  Home/Self Care  In-House Referral:     Discharge planning Services  CM Consult  Post Acute Care Choice:  NA Choice offered to:  NA  DME Arranged:    DME Agency:     HH Arranged:    HH Agency:     Status of Service:  Completed, signed off  If discussed at [employer] of Stay Meetings, dates discussed:    Discharge Disposition: home/self care   Additional Comments:  11/11/16- [place] RN, CM- pt for d/c home today- no CM needs noted for discharge.  Dawayne Patricia, RN 11/11/2016, 4:29 PM

## 2016-11-11 NOTE — Care Management Important Message (Signed)
Important Message  Patient Details  Name: David [name] MRN: [id number] Date of Birth: [birth year]   Medicare Important Message Given:  Yes    Nathen May 11/11/2016, 10:33 AM

## 2016-11-11 NOTE — Progress Notes (Signed)
4 Days Post-Op Procedure(s) (LRB): LV EPICARDIAL PACING LEAD PLACEMENT VIA LEFT MINI THORACOTOMY (Left) Subjective: Sore in chest but otherwise feels ok. Anxious to go home. No shortness of breath  Objective: Vital signs in last 24 hours: Temp:  [97.6 F (36.4 C)-98.7 F (37.1 C)] 98.7 F (37.1 C) (07/20 0801) Pulse Rate:  [67-71] 70 (07/20 0801) Cardiac Rhythm: Ventricular paced (07/20 0708) Resp:  [18] 18 (07/20 0801) BP: (97-121)/(65-79) 121/65 (07/20 0801) SpO2:  [95 %-100 %] 100 % (07/20 0801) Weight:  [78.6 kg (173 lb 4.8 oz)] 78.6 kg (173 lb 4.8 oz) (07/19 0903)  Hemodynamic parameters for last 24 hours:    Intake/Output from previous day: 07/19 0701 - 07/20 0700 In: 720 [P.O.:720] Out: 2275 [Urine:2275] Intake/Output this shift: Total I/O In: 360 [P.O.:360] Out: -   General appearance: alert and cooperative Neurologic: intact Heart: regular rate and rhythm, S1, S2 normal, no murmur, click, rub or gallop Lungs: clear to auscultation bilaterally Extremities: extremities normal, atraumatic, no cyanosis or edema Wound: incisions ok  Lab Results: No results for input(s): WBC, HGB, HCT, PLT in the last 72 hours. BMET: No results for input(s): NA, K, CL, CO2, GLUCOSE, BUN, CREATININE, CALCIUM in the last 72 hours.  PT/INR: No results for input(s): LABPROT, INR in the last 72 hours. ABG    Component Value Date/Time   PHART 7.475 (H) 11/03/2016 1142   HCO3 22.6 11/03/2016 1142   TCO2 21 05/05/2016 0827   ACIDBASEDEF 0.6 11/03/2016 1142   O2SAT 98.2 11/03/2016 1142   CBG (last 3)   Recent Labs  11/08/16 1139  GLUCAP 168*    Assessment/Plan: S/P Procedure(s) (LRB): LV EPICARDIAL PACING LEAD PLACEMENT VIA LEFT MINI THORACOTOMY (Left)  He is doing well overall and ready to go home today. He was on Brilinta preop for hx of PCI/DES in Jan 2018. He was having a lot of shortness of breath with Brilinta which has completely resolved since it was stopped for this  surgery. This should not be restarted. He is allergic to Plavix with bad rash. Will need to discuss with cardiology to decide if he should just be on ASA 6 months after DES or add a different drug to ASA such as Effient.   LOS: 4 days    Gaye Pollack 11/11/2016

## 2016-11-16 ENCOUNTER — Encounter (INDEPENDENT_AMBULATORY_CARE_PROVIDER_SITE_OTHER): Payer: Self-pay

## 2016-11-16 DIAGNOSIS — Z4802 Encounter for removal of sutures: Secondary | ICD-10-CM

## 2016-11-18 ENCOUNTER — Other Ambulatory Visit: Payer: Self-pay

## 2016-11-18 DIAGNOSIS — G8918 Other acute postprocedural pain: Secondary | ICD-10-CM

## 2016-11-18 MED ORDER — OXYCODONE HCL 5 MG PO TABS: 5.0000 mg | ORAL_TABLET | Freq: Four times a day (QID) | ORAL | 0 refills | Status: DC | PRN

## 2016-11-18 NOTE — Telephone Encounter (Signed)
RX for Oxycodone 5 mg refilled. Patient will pick up on 11/21/16

## 2016-11-30 ENCOUNTER — Ambulatory Visit: Payer: Medicare HMO | Admitting: Surgery

## 2016-12-06 ENCOUNTER — Other Ambulatory Visit: Payer: Self-pay | Admitting: Surgery

## 2016-12-06 DIAGNOSIS — I429 Cardiomyopathy, unspecified: Secondary | ICD-10-CM

## 2016-12-07 ENCOUNTER — Encounter: Payer: Self-pay | Admitting: Surgery

## 2016-12-07 ENCOUNTER — Ambulatory Visit
Admission: RE | Admit: 2016-12-07 | Discharge: 2016-12-07 | Disposition: A | Discharge status: Home / Self Care | Payer: Medicare HMO | Source: Ambulatory Visit | Attending: Surgery | Admitting: Surgery

## 2016-12-07 ENCOUNTER — Ambulatory Visit (INDEPENDENT_AMBULATORY_CARE_PROVIDER_SITE_OTHER): Payer: Self-pay | Admitting: Surgery

## 2016-12-07 VITALS — BP 123/80 | HR 73 | Resp 20 | Ht 65.0 in | Wt 164.0 lb

## 2016-12-07 DIAGNOSIS — I454 Nonspecific intraventricular block: Secondary | ICD-10-CM

## 2016-12-07 DIAGNOSIS — I429 Cardiomyopathy, unspecified: Secondary | ICD-10-CM

## 2016-12-07 DIAGNOSIS — Z95810 Presence of automatic (implantable) cardiac defibrillator: Secondary | ICD-10-CM

## 2016-12-07 DIAGNOSIS — I255 Ischemic cardiomyopathy: Secondary | ICD-10-CM

## 2016-12-07 DIAGNOSIS — G8918 Other acute postprocedural pain: Secondary | ICD-10-CM

## 2016-12-07 MED ORDER — OXYCODONE HCL 5 MG PO TABS: 5.0000 mg | ORAL_TABLET | ORAL | 0 refills | Status: DC | PRN

## 2016-12-07 NOTE — Progress Notes (Signed)
      HPI: Patient returns for routine postoperative follow-up having undergone left thoracotomy and insertion of two LV epicardial leads and revision of his CRT system to BiV on 11/07/2016. The patient's early postoperative recovery while in the hospital was notable for an uncomplicated postop course. Since hospital discharge the patient reports that he has no shortness of breath but still has a lot of left chest wall incisional pain. He has been taking the oxycodone IR but requires 10 mg to get relief.   Current Outpatient Prescriptions  Medication Sig Dispense Refill  . amiodarone (PACERONE) 200 MG tablet Take 200 mg by mouth 2 (two) times daily.    Marland Kitchen apixaban (ELIQUIS) 5 MG TABS tablet Take 1 tablet (5 mg total) by mouth 2 (two) times daily. 60 tablet 1  . aspirin 81 MG chewable tablet Chew 1 tablet (81 mg total) by mouth daily. 30 tablet 6  . carvedilol (COREG) 3.125 MG tablet Take 1 tablet (3.125 mg total) by mouth 2 (two) times daily. 60 tablet 3  . furosemide (LASIX) 40 MG tablet Take 40 mg by mouth 2 (two) times daily.    . hydrocortisone cream 1 % Apply 1 application topically 2 (two) times daily as needed for itching.    . ivabradine (CORLANOR) 5 MG TABS tablet Take 0.5 tablets (2.5 mg total) by mouth 2 (two) times daily with a meal. 60 tablet 0  . linaclotide (LINZESS) 290 MCG CAPS capsule Take 290 mcg by mouth daily as needed (constipation).    . Multiple Vitamins-Minerals (ADULT GUMMY) CHEW Chew 2 tablets by mouth daily.    . Oxymetazoline HCl (NASAL SPRAY 12 HOUR NA) Place 1 spray into both nostrils at bedtime.    . ranolazine (RANEXA) 500 MG 12 hr tablet Take 1 tablet (500 mg total) by mouth 2 (two) times daily. 60 tablet 10  . oxyCODONE (OXY IR/ROXICODONE) 5 MG immediate release tablet Take 1-2 tablets (5-10 mg total) by mouth every 4 (four) hours as needed for severe pain. 50 tablet 0   No current facility-administered medications for this visit.     Physical Exam: BP  123/80   Pulse 73   Resp 20   Ht 5\' 5"  (1.651 m)   Wt 164 lb (74.4 kg)   SpO2 98% Comment: RA  BMI 27.29 kg/m  He looks well. Lung exam is clear. Cardiac exam shows a regular rate and rhythm with normal heart sounds. Chest incision is healing well. The pocket incision is healing well.   Diagnostic Tests:  CLINICAL DATA:  Epicardial pacer lead placement  EXAM: CHEST  2 VIEW  COMPARISON:  11/09/2016  FINDINGS: Stable appearance of cardiac pacer. Mild cardiac enlargement is unchanged. No pleural effusion or edema. No airspace opacities. No pneumothorax visualized.  IMPRESSION: 1. No evidence for pneumothorax. 2. Stable cardiac enlargement.   Electronically Signed   By: Kerby Moors M.D.   On: 12/07/2016 12:40   Impression:  He is doing well overall but still has a lot of chest wall pain from the thoracotomy incision. I renewed his oxycodone IR today since that seems to give him relief. I would expect his pain to gradually improve over the next several weeks.  Plan:  I will see him back in 3 weeks to see how his pain is doing. He has a follow up appt with EP in September.   Gaye Pollack, MD [facility] [phone]

## 2016-12-14 ENCOUNTER — Encounter: Payer: Self-pay | Admitting: Nurse Practitioner

## 2016-12-15 ENCOUNTER — Other Ambulatory Visit: Payer: Self-pay | Admitting: *Deleted

## 2016-12-15 DIAGNOSIS — G8918 Other acute postprocedural pain: Secondary | ICD-10-CM

## 2016-12-15 MED ORDER — OXYCODONE HCL 5 MG PO TABS: 5.0000 mg | ORAL_TABLET | ORAL | 0 refills | Status: DC | PRN

## 2016-12-17 ENCOUNTER — Other Ambulatory Visit (HOSPITAL_COMMUNITY): Payer: Self-pay | Admitting: Internal Medicine

## 2016-12-28 ENCOUNTER — Ambulatory Visit (INDEPENDENT_AMBULATORY_CARE_PROVIDER_SITE_OTHER): Payer: Self-pay | Admitting: Surgery

## 2016-12-28 ENCOUNTER — Encounter: Payer: Self-pay | Admitting: Surgery

## 2016-12-28 VITALS — BP 119/68 | HR 71 | Resp 16 | Ht 65.0 in | Wt 164.0 lb

## 2016-12-28 DIAGNOSIS — G8918 Other acute postprocedural pain: Secondary | ICD-10-CM

## 2016-12-28 DIAGNOSIS — I255 Ischemic cardiomyopathy: Secondary | ICD-10-CM

## 2016-12-28 DIAGNOSIS — Z95810 Presence of automatic (implantable) cardiac defibrillator: Secondary | ICD-10-CM

## 2016-12-28 MED ORDER — OXYCODONE HCL 5 MG PO TABS: 5.0000 mg | ORAL_TABLET | ORAL | 0 refills | Status: DC | PRN

## 2016-12-28 NOTE — Progress Notes (Signed)
     HPI:  Patient returns for postoperative follow-up having undergone left thoracotomy and insertion of two LV epicardial leads and revision of his CRT system to BiV on 11/07/2016. He continues to have a lot of left chest wall incisional pain. He has been taking the oxycodone IR for relief. He has tried Ibuprofen, Aleve, and Tylenol without relief. The pain has been causing him difficulty sleeping.   Current Outpatient Prescriptions  Medication Sig Dispense Refill  . amiodarone (PACERONE) 200 MG tablet Take 200 mg by mouth 2 (two) times daily.    Marland Kitchen apixaban (ELIQUIS) 5 MG TABS tablet Take 1 tablet (5 mg total) by mouth 2 (two) times daily. 60 tablet 1  . aspirin 81 MG chewable tablet Chew 1 tablet (81 mg total) by mouth daily. 30 tablet 6  . carvedilol (COREG) 3.125 MG tablet Take 1 tablet (3.125 mg total) by mouth 2 (two) times daily. Needs office visit 60 tablet 1  . furosemide (LASIX) 40 MG tablet Take 40 mg by mouth 2 (two) times daily.    . hydrocortisone cream 1 % Apply 1 application topically 2 (two) times daily as needed for itching.    . ivabradine (CORLANOR) 5 MG TABS tablet Take 0.5 tablets (2.5 mg total) by mouth 2 (two) times daily with a meal. 60 tablet 0  . linaclotide (LINZESS) 290 MCG CAPS capsule Take 290 mcg by mouth daily as needed (constipation).    . Multiple Vitamins-Minerals (ADULT GUMMY) CHEW Chew 2 tablets by mouth daily.    Marland Kitchen oxyCODONE (OXY IR/ROXICODONE) 5 MG immediate release tablet Take 1-2 tablets (5-10 mg total) by mouth every 4 (four) hours as needed for severe pain. 50 tablet 0  . Oxymetazoline HCl (NASAL SPRAY 12 HOUR NA) Place 1 spray into both nostrils at bedtime.    . ranolazine (RANEXA) 500 MG 12 hr tablet Take 1 tablet (500 mg total) by mouth 2 (two) times daily. 60 tablet 10   No current facility-administered medications for this visit.      Physical Exam: BP 119/68 (BP Location: Right Arm, Patient Position: Sitting, Cuff Size: Large)   Pulse 71    Resp 16   Ht 5\' 5"  (1.651 m)   Wt 164 lb (74.4 kg)   SpO2 97% Comment: ON RA  BMI 27.29 kg/m  He looks well. Lung exam is clear. Cardiac exam shows a regular rate and rhythm with normal heart sounds. Chest incision is healing well. The pocket incision is healing well.  Diagnostic Tests:  None today   Impression:  He is doing well overall but still has a lot of chest wall pain from the thoracotomy incision. I renewed his oxycodone IR today since that seems to give him relief. I would expect his pain to gradually improve over the next several weeks. I don't think there is anything else to do at this time except treat his pain and allow the incision to heal.  Plan:  He hill return in 2 weeks to reassess his pain.   Gaye Pollack, MD [facility] [phone]

## 2017-01-04 ENCOUNTER — Ambulatory Visit: Payer: Medicare HMO | Admitting: Nurse Practitioner

## 2017-01-10 ENCOUNTER — Other Ambulatory Visit: Payer: Self-pay | Admitting: Surgery

## 2017-01-10 DIAGNOSIS — I429 Cardiomyopathy, unspecified: Secondary | ICD-10-CM

## 2017-01-11 ENCOUNTER — Encounter: Payer: Medicare HMO | Admitting: Surgery

## 2017-01-25 ENCOUNTER — Encounter: Payer: Self-pay | Admitting: Surgery

## 2017-02-01 ENCOUNTER — Ambulatory Visit (INDEPENDENT_AMBULATORY_CARE_PROVIDER_SITE_OTHER): Payer: Self-pay | Admitting: Surgery

## 2017-02-01 ENCOUNTER — Ambulatory Visit
Admission: RE | Admit: 2017-02-01 | Discharge: 2017-02-01 | Disposition: A | Discharge status: Home / Self Care | Payer: Medicare HMO | Source: Ambulatory Visit | Attending: Surgery | Admitting: Surgery

## 2017-02-01 ENCOUNTER — Encounter: Payer: Self-pay | Admitting: Surgery

## 2017-02-01 VITALS — BP 109/68 | HR 68 | Ht 65.0 in | Wt 158.0 lb

## 2017-02-01 DIAGNOSIS — Z9889 Other specified postprocedural states: Secondary | ICD-10-CM

## 2017-02-01 DIAGNOSIS — I429 Cardiomyopathy, unspecified: Secondary | ICD-10-CM

## 2017-02-01 NOTE — Progress Notes (Signed)
HPI:  Patient returns for postoperative follow-up having undergone left thoracotomy and insertion of two LV epicardial leads and revision of his CRT system to [place] 11/07/2016. He had prolonged postop pain but he says that it has resolved and he is no longer taking any pain meds. He is walking outside in a loop but is not sure how long it is. He makes 4 laps and feels ok at the end. His wife says she is concerned because he still gets short of breath with heavy exertion. He has no shortness of breath with daily activities. He reports being treated for a pneumonia recently with a Z-pak and his cough has resolved. He never had any fever or sputum production.  Current Outpatient Prescriptions  Medication Sig Dispense Refill  . amiodarone (PACERONE) 200 MG tablet Take 200 mg by mouth 2 (two) times daily.    Marland Kitchen apixaban (ELIQUIS) 5 MG TABS tablet Take 1 tablet (5 mg total) by mouth 2 (two) times daily. 60 tablet 1  . aspirin 81 MG chewable tablet Chew 1 tablet (81 mg total) by mouth daily. 30 tablet 6  . carvedilol (COREG) 3.125 MG tablet Take 1 tablet (3.125 mg total) by mouth 2 (two) times daily. Needs office visit 60 tablet 1  . furosemide (LASIX) 40 MG tablet Take 40 mg by mouth 2 (two) times daily.    . hydrocortisone cream 1 % Apply 1 application topically 2 (two) times daily as needed for itching.    . ivabradine (CORLANOR) 5 MG TABS tablet Take 0.5 tablets (2.5 mg total) by mouth 2 (two) times daily with a meal. 60 tablet 0  . linaclotide (LINZESS) 290 MCG CAPS capsule Take 290 mcg by mouth daily as needed (constipation).    . Multiple Vitamins-Minerals (ADULT GUMMY) CHEW Chew 2 tablets by mouth daily.    Marland Kitchen oxyCODONE (OXY IR/ROXICODONE) 5 MG immediate release tablet Take 1-2 tablets (5-10 mg total) by mouth every 4 (four) hours as needed for severe pain. 50 tablet 0  . Oxymetazoline HCl (NASAL SPRAY 12 HOUR NA) Place 1 spray into both nostrils at bedtime.    . ranolazine (RANEXA) 500 MG 12  hr tablet Take 1 tablet (500 mg total) by mouth 2 (two) times daily. 60 tablet 10   No current facility-administered medications for this visit.      Physical Exam: BP 109/68   Pulse 68   Ht 5\' 5"  (1.651 m)   Wt 158 lb (71.7 kg)   SpO2 96%   BMI 26.29 kg/m  He looks well. Lung exam is clear. Cardiac exam shows a regular rate and rhythm with normal heart sounds. Chest incision is healing well. The pocket incision is healing well.  Diagnostic Tests:  CLINICAL DATA:  Congestive heart failure, cough.  EXAM: CHEST  2 VIEW  COMPARISON:  Radiographs of January 07, 2017.  FINDINGS: Stable cardiomegaly with central pulmonary vascular congestion. Left-sided pacemaker is unchanged in position. No pneumothorax is noted. Minimal left basilar atelectasis is noted with minimal left pleural effusion. Bony thorax is unremarkable.  IMPRESSION: Stable cardiomegaly and central pulmonary vascular congestion. Minimal left basilar subsegmental atelectasis is noted with minimal left pleural effusion.   Electronically Signed   By: Marijo Conception, M.D.   On: 02/01/2017 09:46   Impression:  He seems to be doing much better now. He feels like his stamina is improved and he is not having shortness of breath with normal daily activities or walking at a normal  pace. Considering his low EF I think he is doing well. His CXR looks good and I don't see any sign of pneumonia.  Plan:  He is going to follow up with Dr. Caryl Comes soon. He will return to see me if he develops any problems with his incisions.   Gaye Pollack, MD [facility] [phone]

## 2017-02-23 ENCOUNTER — Other Ambulatory Visit (HOSPITAL_COMMUNITY): Payer: Self-pay | Admitting: Internal Medicine

## 2017-03-08 ENCOUNTER — Other Ambulatory Visit: Payer: Self-pay

## 2017-03-08 ENCOUNTER — Ambulatory Visit (HOSPITAL_BASED_OUTPATIENT_CLINIC_OR_DEPARTMENT_OTHER)
Admission: RE | Admit: 2017-03-08 | Discharge: 2017-03-08 | Disposition: A | Discharge status: Home / Self Care | Payer: Medicare HMO | Source: Ambulatory Visit | Attending: Internal Medicine | Admitting: Internal Medicine

## 2017-03-08 ENCOUNTER — Encounter: Payer: Self-pay | Admitting: *Deleted

## 2017-03-08 ENCOUNTER — Encounter (HOSPITAL_COMMUNITY): Payer: Self-pay | Admitting: Internal Medicine

## 2017-03-08 ENCOUNTER — Encounter (HOSPITAL_COMMUNITY): Payer: Self-pay | Admitting: *Deleted

## 2017-03-08 VITALS — BP 122/76 | HR 71 | Wt 157.8 lb

## 2017-03-08 DIAGNOSIS — N183 Chronic kidney disease, stage 3 (moderate): Secondary | ICD-10-CM

## 2017-03-08 DIAGNOSIS — Z95810 Presence of automatic (implantable) cardiac defibrillator: Secondary | ICD-10-CM

## 2017-03-08 DIAGNOSIS — I252 Old myocardial infarction: Secondary | ICD-10-CM

## 2017-03-08 DIAGNOSIS — E785 Hyperlipidemia, unspecified: Secondary | ICD-10-CM

## 2017-03-08 DIAGNOSIS — I2510 Atherosclerotic heart disease of native coronary artery without angina pectoris: Secondary | ICD-10-CM

## 2017-03-08 DIAGNOSIS — I493 Ventricular premature depolarization: Secondary | ICD-10-CM | POA: Insufficient documentation

## 2017-03-08 DIAGNOSIS — I5022 Chronic systolic (congestive) heart failure: Secondary | ICD-10-CM | POA: Diagnosis not present

## 2017-03-08 DIAGNOSIS — I130 Hypertensive heart and chronic kidney disease with heart failure and stage 1 through stage 4 chronic kidney disease, or unspecified chronic kidney disease: Secondary | ICD-10-CM

## 2017-03-08 DIAGNOSIS — Z7982 Long term (current) use of aspirin: Secondary | ICD-10-CM

## 2017-03-08 DIAGNOSIS — Z79899 Other long term (current) drug therapy: Secondary | ICD-10-CM

## 2017-03-08 DIAGNOSIS — Z7901 Long term (current) use of anticoagulants: Secondary | ICD-10-CM | POA: Insufficient documentation

## 2017-03-08 DIAGNOSIS — F17210 Nicotine dependence, cigarettes, uncomplicated: Secondary | ICD-10-CM

## 2017-03-08 DIAGNOSIS — Z006 Encounter for examination for normal comparison and control in clinical research program: Secondary | ICD-10-CM

## 2017-03-08 DIAGNOSIS — I447 Left bundle-branch block, unspecified: Secondary | ICD-10-CM

## 2017-03-08 DIAGNOSIS — I480 Paroxysmal atrial fibrillation: Secondary | ICD-10-CM | POA: Insufficient documentation

## 2017-03-08 DIAGNOSIS — I255 Ischemic cardiomyopathy: Secondary | ICD-10-CM

## 2017-03-08 DIAGNOSIS — R569 Unspecified convulsions: Secondary | ICD-10-CM | POA: Insufficient documentation

## 2017-03-08 LAB — BASIC METABOLIC PANEL
ANION GAP: 8 (ref 5–15)
BUN: 17 mg/dL (ref 6–20)
CALCIUM: 9.1 mg/dL (ref 8.9–10.3)
CHLORIDE: 105 mmol/L (ref 101–111)
CO2: 26 mmol/L (ref 22–32)
Creatinine, Ser: 1.72 mg/dL — ABNORMAL HIGH (ref 0.61–1.24)
GFR calc non Af Amer: 44 mL/min — ABNORMAL LOW (ref >60)
GFR, EST AFRICAN AMERICAN: 51 mL/min — AB (ref >60)
Glucose, Bld: 104 mg/dL — ABNORMAL HIGH (ref 65–99)
POTASSIUM: 4 mmol/L (ref 3.5–5.1)
Sodium: 139 mmol/L (ref 135–145)

## 2017-03-08 LAB — CBC
HEMATOCRIT: 39.7 % (ref 39.0–52.0)
HEMOGLOBIN: 12.6 g/dL — AB (ref 13.0–17.0)
MCH: 24.9 pg — AB (ref 26.0–34.0)
MCHC: 31.7 g/dL (ref 30.0–36.0)
MCV: 78.3 fL (ref 78.0–100.0)
Platelets: 332 10*3/uL (ref 150–400)
RBC: 5.07 MIL/uL (ref 4.22–5.81)
RDW: 16.5 % — ABNORMAL HIGH (ref 11.5–15.5)
WBC: 11.7 10*3/uL — ABNORMAL HIGH (ref 4.0–10.5)

## 2017-03-08 LAB — PROTIME-INR
INR: 1.55
PROTHROMBIN TIME: 18.5 s — AB (ref 11.4–15.2)

## 2017-03-08 NOTE — Patient Instructions (Signed)
Increase Furosemide to 80 mg Twice daily FOR 2 DAYS ONLY  Your physician recommends that you schedule a follow-up appointment in: 3 weeks

## 2017-03-08 NOTE — Progress Notes (Signed)
Patient meets inclusion/exclusion criteria for [facility] Heart Failure Research Study.  Dr. Haroldine Laws and Dr. Aundra Dubin aware of potential screening.  Galactic Study discussed with the patient and wife at beside; consent provided for review; and all questions/concerns answered.  Consent left with patient and wife to review at home.  Wife stated that she would contact me if David [name] would or would not want to pursue Research.

## 2017-03-08 NOTE — Progress Notes (Signed)
Advanced Heart Failure Clinic Note   Primary Cardiologist : Dr. Geraldo Pitter [facility]) Primary HF: Dr. Haroldine Laws   HPI:  David [name] is a 51 y.o. male w PMH of ICM ('09 PCI to LAD, mRCA, '10 PCI to Lcx and '12 mRCA), HFrEF (EF ~20% for > 5 years), HLD and HTN who presented to [facility] on 04/[facility]/16 after ~ 2 weeks of worsening SOB, DOE, and orthopnea.   Admitted to [facility] ED 04/29/16 for Afib/AFL. Had Brady/PEA arrest, was intubated, started on pressors and transferred to [facility]. Pressors weaned as tolerated. Extubated 05/01/16. Repeat Echo showed LVEF 15-20%.   Cath on 05/05/16 (full report below) with 3V CAD and high grade lesion RCA and LAD. PCWP 37 so diuresed for optimization prior to PCI.  Pt underwent successful stenting of the proximal to mid RCA and PLOM with DES x 2 and successful stenting of the mid LAD with DES 05/09/16. Placed on ASA and Brilinta for 30 days, and then to transition off ASA and onto Eliquis/Xarelto.   Pt noted to have 2 episodes of bradycardia into the 20s with lightheadedness s/p cath. No further episodes after several hours. No intervention required. EP consulted to consider ICD with brady episodes. Felt like more vagal induced and deferred CRT-D consideration for outpatient. Discharge weight 152 lbs.  Admitted 07/27/16-08/02/16 for palpitations and weakness. Found to have frequent PVCs. Ranexa added to amiodarone. ICD placed - unable to get LV lead placed. Has been referred for epicardial lead with Dr. Cyndia Bent Digoxin and spiro stopped due to worsening renal function.   Pt underwent left thoracotomy and insertion of two LV epicardial leads and revision of his CRT system to BiV on 11/07/2016. He saw Dr. Cyndia Bent in Follow up 02/01/2017.  Pt presents today for follow up. Last seen in HF clinic 07/2016.  C/o SOB with even mild exertion, and at times at rest. + orthopnea and PND. Activity level way down. Poor appetite. Very weak per wife. SOB changing clothes or  showering.  He has felt this way since he had PNA several months ago. Recent CXR clear (by PCP, not on chart). Denies chest pain.   Echo in clinic today done personally EF 15-20% RV ok.   Echo 07/29/2016 LVEF 25%, Mild AI, Mild MR, Mod LAE.  RHC/LHC 05/06/2016   Prox RCA to Mid RCA lesion, 80 %stenosed.  Mid RCA lesion, 40 %stenosed.  Dist RCA lesion, 20 %stenosed.  Post Atrio lesion, 80 %stenosed.  Ost Cx to Mid Cx lesion, 40 %stenosed.  Mid LAD to Dist LAD lesion, 70 %stenosed.  Dist LAD lesion, 80 %stenosed. Findings: Ao = 101/75 (87) LV = 102/35 RA = 8 RV = 62/10 PA = 65/34 (47) PCW = 37 (v = 45) Fick cardiac output/index = 3.8/2.1 PVR = 2.9 WU SVR = 1555 FA sat = 96% PA sat = 60%. 61%  Past Medical History:  Diagnosis Date  . AICD (automatic cardioverter/defibrillator) present   . CAD (coronary artery disease) 2009   AMI in 12/2007 with PCI to LAD, staged PCI to  mid/distal RCA, NSTEMI in 02/2009 with BMS to LCx  . CHF (congestive heart failure) ([place])   . Chronic kidney disease 11/03/2016   stage 3 kidney disease  . HLD (hyperlipidemia)   . HTN (hypertension)   . Ischemic cardiomyopathy    Admitted in 07/2010 with CHF exacerbation  . Myocardial infarction ([place])   . Seizures ([place]) 11/03/2016   one seizure in 04/2016 during cardiac event  Current Outpatient Medications  Medication Sig Dispense Refill  . amiodarone (PACERONE) 200 MG tablet Take 200 mg by mouth 2 (two) times daily.    Marland Kitchen apixaban (ELIQUIS) 5 MG TABS tablet Take 1 tablet (5 mg total) by mouth 2 (two) times daily. 60 tablet 1  . aspirin 81 MG chewable tablet Chew 1 tablet (81 mg total) by mouth daily. 30 tablet 6  . carvedilol (COREG) 3.125 MG tablet TAKE 1 TABLET BY MOUTH TWICE DAILY -  NEEDS  OFFICE  VISIT. 60 tablet 1  . furosemide (LASIX) 40 MG tablet Take 40 mg by mouth 2 (two) times daily.    . hydrocortisone cream 1 % Apply 1 application topically 2 (two) times daily as needed for  itching.    . ivabradine (CORLANOR) 5 MG TABS tablet Take 0.5 tablets (2.5 mg total) by mouth 2 (two) times daily with a meal. 60 tablet 0  . linaclotide (LINZESS) 290 MCG CAPS capsule Take 290 mcg by mouth daily as needed (constipation).    . Multiple Vitamins-Minerals (ADULT GUMMY) CHEW Chew 2 tablets by mouth daily.    Marland Kitchen oxyCODONE (OXY IR/ROXICODONE) 5 MG immediate release tablet Take 1-2 tablets (5-10 mg total) by mouth every 4 (four) hours as needed for severe pain. 50 tablet 0  . Oxymetazoline HCl (NASAL SPRAY 12 HOUR NA) Place 1 spray into both nostrils at bedtime.    . ranolazine (RANEXA) 500 MG 12 hr tablet Take 1 tablet (500 mg total) by mouth 2 (two) times daily. 60 tablet 10   No current facility-administered medications for this encounter.     Allergies  Allergen Reactions  . Plavix [Clopidogrel Bisulfate] Hives      Social History   Socioeconomic History  . Marital status: Married    Spouse name: Not on file  . Number of children: Not on file  . Years of education: Not on file  . Highest education level: Not on file  Social Needs  . Financial resource strain: Not on file  . Food insecurity - worry: Not on file  . Food insecurity - inability: Not on file  . Transportation needs - medical: Not on file  . Transportation needs - non-medical: Not on file  Occupational History  . Not on file  Tobacco Use  . Smoking status: Former Smoker    Packs/day: 0.20    Years: 30.00    Pack years: 6.00    Types: Cigars    Last attempt to quit: 04/29/2016    Years since quitting: 0.8  . Smokeless tobacco: Never Used  . Tobacco comment: Used to smoke cigarettes. Pt is currently smoking 3 black & mild cigars per day. He has cut down but never quit.  Substance and Sexual Activity  . Alcohol use: No  . Drug use: No  . Sexual activity: Not on file  Other Topics Concern  . Not on file  Social History Narrative  . Not on file    Family History  Problem Relation Age of Onset    . Coronary artery disease Father   . Hypertension Father   . Hypertension Sister   . Coronary artery disease Brother   . Emphysema Brother   . Coronary artery disease Mother   . Hypertension Mother   . Emphysema Mother   . Hypertension Daughter   . Obesity Daughter   . Diabetes Unknown   . Hypertension Unknown   . Coronary artery disease Unknown    Vitals:   03/08/17 1420  BP: 122/76  Pulse: 71  SpO2: 100%  Weight: 157 lb 12 oz (71.6 kg)   Wt Readings from Last 3 Encounters:  03/08/17 157 lb 12 oz (71.6 kg)  02/01/17 158 lb (71.7 kg)  12/28/16 164 lb (74.4 kg)    PHYSICAL EXAM: General:  Fatigued appearing. No resp difficulty HEENT: normal Neck: supple. JVP 7-8 Carotids 2+ bilat; no bruits. No lymphadenopathy or thryomegaly appreciated. Cor: PMI laterally displaced. Regular rate & rhythm. 2/6 MR soft s3 + well-healed scar from epicardial lead surgery  Lungs: clear Abdomen: soft, nontender, mildly distended. No hepatosplenomegaly. No bruits or masses. Good bowel sounds. Extremities: no cyanosis, clubbing, rash, trace edema Neuro: alert & orientedx3, cranial nerves grossly intact. moves all 4 extremities w/o difficulty. Affect pleasant  ICD reviewed personally in clinic: optivol way up. No AF or VT. Activity level down from 2 hours to 1 hour. 100% RT pacing    ASSESSMENT & PLAN:  1. Chronic systolic HF  -Echo 09/25/1306 LVEF 25%, Mild AI, Mild MR, Mod LAE. - Echo performed at bedside by Dr. Haroldine Laws  - NYHA IIIb - Volume status elevated on exam. - Increase lasix to 80 mg BID until [facility] this Friday.  - No ACE/ARB/ARNI with CKD  - No spiro or digoxin with CKD.  - Continue ivabradine 2.5mg  daily.  - Continue coreg 3.125 mg BID.  - Could not tolerate Bidil, caused hypotension.  - With decompensation, will plan for [facility] this week.  2. CAD s/p PCI to mid RCA and PLOM with DES x 2 and DES to mid LAD in 1/18.  - Continue ASA and Brilinta  3. PAF - Maintaining NSR.  Continue Amiodarone.  - Continue eliquis 5 mg BID.  4. CKD stage III - Last creatinine 1.28. 10/2016.  - Pre-cath labs today.  - No nephrotoxic medications as above.  5. Tobacco abuse. - Stopped smoking in January.  6. LBBB - Now s/p CRT-D upgrade in July 2018. 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   Shirley Friar, PA-C 03/08/17  2:38 PM   Patient seen and examined with the above-signed Advanced Practice Provider and/or Housestaff. I personally reviewed laboratory data, imaging studies and relevant notes. I independently examined the patient and formulated the important aspects of the plan. I have edited the note to reflect any of my changes or salient points. I have personally discussed the plan with the patient and/or family.  He is mildly volume overloaded and has symptoms of low output. I performed bedside echo today personally and LV has persistent severe dysfunction despite CRT with epicardial lead. Will double lasix to 80 po bid and schedule for R/L heart cath on Friday. We discussed potential need for admission pth cath to begin cosndiering advanced therapies. Will discuss possible CRT reprogramming with EP.   ICD interrogated personally in clinic. Shows worsening activity level. No AF/VT. No ICD shocks.  Total time spent 45 minutes. Over half that time spent discussing above.   Glori Bickers, MD  7:40 AM

## 2017-03-08 NOTE — Progress Notes (Signed)
ReDS Vest - 03/08/17 1400      ReDS Vest   MR   No    Estimated volume prior to reading  Med    Fitting Posture  Sitting    Ruler Value  Time    ReDS Value  34

## 2017-03-08 NOTE — H&P (View-Only) (Signed)
Advanced Heart Failure Clinic Note   Primary Cardiologist : Dr. Geraldo [facility]) Primary HF: Dr. Haroldine Laws   HPI:  David [name] is a 51 y.o. male w PMH of ICM ('09 PCI to LAD, mRCA, '10 PCI to Lcx and '12 mRCA), HFrEF (EF ~20% for > 5 years), HLD and HTN who presented to [facility] on 04/29/16 after ~ 2 weeks of worsening SOB, DOE, and orthopnea.   Admitted to [facility] ED 04/29/16 for Afib/AFL. Had Brady/PEA arrest, was intubated, started on pressors and transferred to [facility]. Pressors weaned as tolerated. Extubated 05/01/16. Repeat Echo showed LVEF 15-20%.   Cath on 05/05/16 (full report below) with 3V CAD and high grade lesion RCA and LAD. PCWP 37 so diuresed for optimization prior to PCI.  Pt underwent successful stenting of the proximal to mid RCA and PLOM with DES x 2 and successful stenting of the mid LAD with DES 05/09/16. Placed on ASA and Brilinta for 30 days, and then to transition off ASA and onto Eliquis/Xarelto.   Pt noted to have 2 episodes of bradycardia into the 20s with lightheadedness s/p cath. No further episodes after several hours. No intervention required. EP consulted to consider ICD with brady episodes. Felt like more vagal induced and deferred CRT-D consideration for outpatient. Discharge weight 152 lbs.  Admitted 07/27/16-08/02/16 for palpitations and weakness. Found to have frequent PVCs. Ranexa added to amiodarone. ICD placed - unable to get LV lead placed. Has been referred for epicardial lead with Dr. Cyndia Bent Digoxin and spiro stopped due to worsening renal function.   Pt underwent left thoracotomy and insertion of two LV epicardial leads and revision of his CRT system to BiV on 11/07/2016. He saw Dr. Cyndia Bent in Follow up 02/01/2017.  Pt presents today for follow up. Last seen in HF clinic 07/2016.  C/o SOB with even mild exertion, and at times at rest. + orthopnea and PND. Activity level way down. Poor appetite. Very weak per wife. SOB changing clothes or  showering.  He has felt this way since he had PNA several months ago. Recent CXR clear (by PCP, not on chart). Denies chest pain.   Echo in clinic today done personally EF 15-20% RV ok.   Echo 07/29/2016 LVEF 25%, Mild AI, Mild MR, Mod LAE.  RHC/LHC 05/06/2016   Prox RCA to Mid RCA lesion, 80 %stenosed.  Mid RCA lesion, 40 %stenosed.  Dist RCA lesion, 20 %stenosed.  Post Atrio lesion, 80 %stenosed.  Ost Cx to Mid Cx lesion, 40 %stenosed.  Mid LAD to Dist LAD lesion, 70 %stenosed.  Dist LAD lesion, 80 %stenosed. Findings: Ao = 101/75 (87) LV = 102/35 RA = 8 RV = 62/10 PA = 65/34 (47) PCW = 37 (v = 45) Fick cardiac output/index = 3.8/2.1 PVR = 2.9 WU SVR = 1555 FA sat = 96% PA sat = 60%. 61%  Past Medical History:  Diagnosis Date  . AICD (automatic cardioverter/defibrillator) present   . CAD (coronary artery disease) 2009   AMI in 12/2007 with PCI to LAD, staged PCI to  mid/distal RCA, NSTEMI in 02/2009 with BMS to LCx  . CHF (congestive heart failure) ([place])   . Chronic kidney disease 11/03/2016   stage 3 kidney disease  . HLD (hyperlipidemia)   . HTN (hypertension)   . Ischemic cardiomyopathy    Admitted in 07/2010 with CHF exacerbation  . Myocardial infarction ([place])   . Seizures ([place]) 11/03/2016   one seizure in 04/2016 during cardiac event  Current Outpatient Medications  Medication Sig Dispense Refill  . amiodarone (PACERONE) 200 MG tablet Take 200 mg by mouth 2 (two) times daily.    Marland Kitchen apixaban (ELIQUIS) 5 MG TABS tablet Take 1 tablet (5 mg total) by mouth 2 (two) times daily. 60 tablet 1  . aspirin 81 MG chewable tablet Chew 1 tablet (81 mg total) by mouth daily. 30 tablet 6  . carvedilol (COREG) 3.125 MG tablet TAKE 1 TABLET BY MOUTH TWICE DAILY -  NEEDS  OFFICE  VISIT. 60 tablet 1  . furosemide (LASIX) 40 MG tablet Take 40 mg by mouth 2 (two) times daily.    . hydrocortisone cream 1 % Apply 1 application topically 2 (two) times daily as needed for  itching.    . ivabradine (CORLANOR) 5 MG TABS tablet Take 0.5 tablets (2.5 mg total) by mouth 2 (two) times daily with a meal. 60 tablet 0  . linaclotide (LINZESS) 290 MCG CAPS capsule Take 290 mcg by mouth daily as needed (constipation).    . Multiple Vitamins-Minerals (ADULT GUMMY) CHEW Chew 2 tablets by mouth daily.    Marland Kitchen oxyCODONE (OXY IR/ROXICODONE) 5 MG immediate release tablet Take 1-2 tablets (5-10 mg total) by mouth every 4 (four) hours as needed for severe pain. 50 tablet 0  . Oxymetazoline HCl (NASAL SPRAY 12 HOUR NA) Place 1 spray into both nostrils at bedtime.    . ranolazine (RANEXA) 500 MG 12 hr tablet Take 1 tablet (500 mg total) by mouth 2 (two) times daily. 60 tablet 10   No current facility-administered medications for this encounter.     Allergies  Allergen Reactions  . Plavix [Clopidogrel Bisulfate] Hives      Social History   Socioeconomic History  . Marital status: Married    Spouse name: Not on file  . Number of children: Not on file  . Years of education: Not on file  . Highest education level: Not on file  Social Needs  . Financial resource strain: Not on file  . Food insecurity - worry: Not on file  . Food insecurity - inability: Not on file  . Transportation needs - medical: Not on file  . Transportation needs - non-medical: Not on file  Occupational History  . Not on file  Tobacco Use  . Smoking status: Former Smoker    Packs/day: 0.20    Years: 30.00    Pack years: 6.00    Types: Cigars    Last attempt to quit: 04/29/2016    Years since quitting: 0.8  . Smokeless tobacco: Never Used  . Tobacco comment: Used to smoke cigarettes. Pt is currently smoking 3 black & mild cigars per day. He has cut down but never quit.  Substance and Sexual Activity  . Alcohol use: No  . Drug use: No  . Sexual activity: Not on file  Other Topics Concern  . Not on file  Social History Narrative  . Not on file    Family History  Problem Relation Age of Onset    . Coronary artery disease Father   . Hypertension Father   . Hypertension Sister   . Coronary artery disease Brother   . Emphysema Brother   . Coronary artery disease Mother   . Hypertension Mother   . Emphysema Mother   . Hypertension Daughter   . Obesity Daughter   . Diabetes Unknown   . Hypertension Unknown   . Coronary artery disease Unknown    Vitals:   03/08/17 1420  BP: 122/76  Pulse: 71  SpO2: 100%  Weight: 157 lb 12 oz (71.6 kg)   Wt Readings from Last 3 Encounters:  03/08/17 157 lb 12 oz (71.6 kg)  02/01/17 158 lb (71.7 kg)  12/28/16 164 lb (74.4 kg)    PHYSICAL EXAM: General:  Fatigued appearing. No resp difficulty HEENT: normal Neck: supple. JVP 7-8 Carotids 2+ bilat; no bruits. No lymphadenopathy or thryomegaly appreciated. Cor: PMI laterally displaced. Regular rate & rhythm. 2/6 MR soft s3 + well-healed scar from epicardial lead surgery  Lungs: clear Abdomen: soft, nontender, mildly distended. No hepatosplenomegaly. No bruits or masses. Good bowel sounds. Extremities: no cyanosis, clubbing, rash, trace edema Neuro: alert & orientedx3, cranial nerves grossly intact. moves all 4 extremities w/o difficulty. Affect pleasant  ICD reviewed personally in clinic: optivol way up. No AF or VT. Activity level down from 2 hours to 1 hour. 100% RT pacing    ASSESSMENT & PLAN:  1. Chronic systolic HF  -Echo 0/04/7791 LVEF 25%, Mild AI, Mild MR, Mod LAE. - Echo performed at bedside by Dr. Haroldine Laws  - NYHA IIIb - Volume status elevated on exam. - Increase lasix to 80 mg BID until [facility] this Friday.  - No ACE/ARB/ARNI with CKD  - No spiro or digoxin with CKD.  - Continue ivabradine 2.5mg  daily.  - Continue coreg 3.125 mg BID.  - Could not tolerate Bidil, caused hypotension.  - With decompensation, will plan for [facility] this week.  2. CAD s/p PCI to mid RCA and PLOM with DES x 2 and DES to mid LAD in 1/18.  - Continue ASA and Brilinta  3. PAF - Maintaining NSR.  Continue Amiodarone.  - Continue eliquis 5 mg BID.  4. CKD stage III - Last creatinine 1.28. 10/2016.  - Pre-cath labs today.  - No nephrotoxic medications as above.  5. Tobacco abuse. - Stopped smoking in January.  6. LBBB - Now s/p CRT-D upgrade in July 2018. 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   Shirley Friar, PA-C 03/08/17  2:38 PM   Patient seen and examined with the above-signed Advanced Practice Provider and/or Housestaff. I personally reviewed laboratory data, imaging studies and relevant notes. I independently examined the patient and formulated the important aspects of the plan. I have edited the note to reflect any of my changes or salient points. I have personally discussed the plan with the patient and/or family.  He is mildly volume overloaded and has symptoms of low output. I performed bedside echo today personally and LV has persistent severe dysfunction despite CRT with epicardial lead. Will double lasix to 80 po bid and schedule for R/L heart cath on Friday. We discussed potential need for admission pth cath to begin cosndiering advanced therapies. Will discuss possible CRT reprogramming with EP.   ICD interrogated personally in clinic. Shows worsening activity level. No AF/VT. No ICD shocks.  Total time spent 45 minutes. Over half that time spent discussing above.   Glori Bickers, MD  7:40 AM

## 2017-03-09 ENCOUNTER — Other Ambulatory Visit (HOSPITAL_COMMUNITY): Payer: Self-pay

## 2017-03-09 ENCOUNTER — Telehealth (HOSPITAL_COMMUNITY): Payer: Self-pay | Admitting: *Deleted

## 2017-03-09 DIAGNOSIS — R0600 Dyspnea, unspecified: Secondary | ICD-10-CM

## 2017-03-09 NOTE — Telephone Encounter (Signed)
Called [employer] to f/u on PA submitted 11/14 for pt's R/L HC.  Cath approved 03/10/17 for 30 days.  Approval # [id number], pre-service center is aware

## 2017-03-10 ENCOUNTER — Inpatient Hospital Stay (HOSPITAL_COMMUNITY)
Admission: AD | Admit: 2017-03-10 | Discharge: 2017-03-14 | DRG: 286 | Disposition: A | Discharge status: Home Health | Payer: Medicare HMO | Source: Ambulatory Visit | Attending: Internal Medicine | Admitting: Internal Medicine

## 2017-03-10 ENCOUNTER — Encounter (HOSPITAL_COMMUNITY): Payer: Self-pay | Admitting: Internal Medicine

## 2017-03-10 ENCOUNTER — Encounter (HOSPITAL_COMMUNITY)
Admission: AD | Disposition: A | Discharge status: Home Health | Payer: Self-pay | Source: Ambulatory Visit | Attending: Internal Medicine

## 2017-03-10 ENCOUNTER — Inpatient Hospital Stay (HOSPITAL_COMMUNITY): Payer: Medicare HMO

## 2017-03-10 ENCOUNTER — Other Ambulatory Visit: Payer: Self-pay

## 2017-03-10 DIAGNOSIS — Z79899 Other long term (current) drug therapy: Secondary | ICD-10-CM | POA: Diagnosis not present

## 2017-03-10 DIAGNOSIS — I480 Paroxysmal atrial fibrillation: Secondary | ICD-10-CM | POA: Diagnosis present

## 2017-03-10 DIAGNOSIS — Z7901 Long term (current) use of anticoagulants: Secondary | ICD-10-CM | POA: Diagnosis not present

## 2017-03-10 DIAGNOSIS — I2510 Atherosclerotic heart disease of native coronary artery without angina pectoris: Secondary | ICD-10-CM | POA: Diagnosis present

## 2017-03-10 DIAGNOSIS — Z7189 Other specified counseling: Secondary | ICD-10-CM | POA: Diagnosis not present

## 2017-03-10 DIAGNOSIS — I4892 Unspecified atrial flutter: Secondary | ICD-10-CM | POA: Diagnosis present

## 2017-03-10 DIAGNOSIS — Z23 Encounter for immunization: Secondary | ICD-10-CM | POA: Diagnosis present

## 2017-03-10 DIAGNOSIS — Z8674 Personal history of sudden cardiac arrest: Secondary | ICD-10-CM | POA: Diagnosis not present

## 2017-03-10 DIAGNOSIS — N179 Acute kidney failure, unspecified: Secondary | ICD-10-CM | POA: Diagnosis present

## 2017-03-10 DIAGNOSIS — I493 Ventricular premature depolarization: Secondary | ICD-10-CM | POA: Diagnosis present

## 2017-03-10 DIAGNOSIS — I130 Hypertensive heart and chronic kidney disease with heart failure and stage 1 through stage 4 chronic kidney disease, or unspecified chronic kidney disease: Secondary | ICD-10-CM | POA: Diagnosis present

## 2017-03-10 DIAGNOSIS — Z515 Encounter for palliative care: Secondary | ICD-10-CM

## 2017-03-10 DIAGNOSIS — I5023 Acute on chronic systolic (congestive) heart failure: Secondary | ICD-10-CM | POA: Diagnosis present

## 2017-03-10 DIAGNOSIS — I252 Old myocardial infarction: Secondary | ICD-10-CM

## 2017-03-10 DIAGNOSIS — Z7982 Long term (current) use of aspirin: Secondary | ICD-10-CM

## 2017-03-10 DIAGNOSIS — I509 Heart failure, unspecified: Secondary | ICD-10-CM

## 2017-03-10 DIAGNOSIS — Z888 Allergy status to other drugs, medicaments and biological substances status: Secondary | ICD-10-CM

## 2017-03-10 DIAGNOSIS — I447 Left bundle-branch block, unspecified: Secondary | ICD-10-CM | POA: Diagnosis present

## 2017-03-10 DIAGNOSIS — R570 Cardiogenic shock: Secondary | ICD-10-CM

## 2017-03-10 DIAGNOSIS — Z720 Tobacco use: Secondary | ICD-10-CM | POA: Diagnosis not present

## 2017-03-10 DIAGNOSIS — Z95810 Presence of automatic (implantable) cardiac defibrillator: Secondary | ICD-10-CM | POA: Diagnosis not present

## 2017-03-10 DIAGNOSIS — N183 Chronic kidney disease, stage 3 (moderate): Secondary | ICD-10-CM | POA: Diagnosis present

## 2017-03-10 DIAGNOSIS — Z955 Presence of coronary angioplasty implant and graft: Secondary | ICD-10-CM | POA: Diagnosis not present

## 2017-03-10 DIAGNOSIS — I340 Nonrheumatic mitral (valve) insufficiency: Secondary | ICD-10-CM | POA: Diagnosis present

## 2017-03-10 DIAGNOSIS — I255 Ischemic cardiomyopathy: Secondary | ICD-10-CM | POA: Diagnosis present

## 2017-03-10 DIAGNOSIS — I351 Nonrheumatic aortic (valve) insufficiency: Secondary | ICD-10-CM | POA: Diagnosis not present

## 2017-03-10 DIAGNOSIS — E876 Hypokalemia: Secondary | ICD-10-CM | POA: Diagnosis not present

## 2017-03-10 DIAGNOSIS — Z01810 Encounter for preprocedural cardiovascular examination: Secondary | ICD-10-CM | POA: Diagnosis not present

## 2017-03-10 DIAGNOSIS — R0600 Dyspnea, unspecified: Secondary | ICD-10-CM

## 2017-03-10 HISTORY — PX: RIGHT/LEFT HEART CATH AND CORONARY ANGIOGRAPHY: CATH118266

## 2017-03-10 LAB — POCT I-STAT 3, VENOUS BLOOD GAS (G3P V)
ACID-BASE EXCESS: 1 mmol/L (ref 0.0–2.0)
ACID-BASE EXCESS: 3 mmol/L — AB (ref 0.0–2.0)
Bicarbonate: 25.8 mmol/L (ref 20.0–28.0)
Bicarbonate: 27.7 mmol/L (ref 20.0–28.0)
O2 SAT: 50 %
O2 SAT: 52 %
PH VEN: 7.405 (ref 7.250–7.430)
TCO2: 27 mmol/L (ref 22–32)
TCO2: 29 mmol/L (ref 22–32)
pCO2, Ven: 42.4 mmHg — ABNORMAL LOW (ref 44.0–60.0)
pCO2, Ven: 44.3 mmHg (ref 44.0–60.0)
pH, Ven: 7.393 (ref 7.250–7.430)
pO2, Ven: 27 mmHg — CL (ref 32.0–45.0)
pO2, Ven: 28 mmHg — CL (ref 32.0–45.0)

## 2017-03-10 LAB — APTT
APTT: 32 s (ref 24–36)
aPTT: 55 seconds — ABNORMAL HIGH (ref 24–36)

## 2017-03-10 LAB — POCT I-STAT 3, ART BLOOD GAS (G3+)
ACID-BASE EXCESS: 1 mmol/L (ref 0.0–2.0)
Bicarbonate: 25.1 mmol/L (ref 20.0–28.0)
O2 SAT: 98 %
PH ART: 7.45 (ref 7.350–7.450)
TCO2: 26 mmol/L (ref 22–32)
pCO2 arterial: 36.1 mmHg (ref 32.0–48.0)
pO2, Arterial: 92 mmHg (ref 83.0–108.0)

## 2017-03-10 LAB — BASIC METABOLIC PANEL
Anion gap: 10 (ref 5–15)
BUN: 17 mg/dL (ref 6–20)
CO2: 26 mmol/L (ref 22–32)
CREATININE: 1.82 mg/dL — AB (ref 0.61–1.24)
Calcium: 8.9 mg/dL (ref 8.9–10.3)
Chloride: 103 mmol/L (ref 101–111)
GFR, EST AFRICAN AMERICAN: 48 mL/min — AB (ref >60)
GFR, EST NON AFRICAN AMERICAN: 41 mL/min — AB (ref >60)
Glucose, Bld: 84 mg/dL (ref 65–99)
Potassium: 3 mmol/L — ABNORMAL LOW (ref 3.5–5.1)
SODIUM: 139 mmol/L (ref 135–145)

## 2017-03-10 LAB — MRSA PCR SCREENING: MRSA by PCR: NEGATIVE

## 2017-03-10 LAB — COOXEMETRY PANEL
Carboxyhemoglobin: 1.1 % (ref 0.5–1.5)
Methemoglobin: 1 % (ref 0.0–1.5)
O2 Saturation: 34.8 %
TOTAL HEMOGLOBIN: 14.5 g/dL (ref 12.0–16.0)

## 2017-03-10 LAB — HEPARIN LEVEL (UNFRACTIONATED): HEPARIN UNFRACTIONATED: 0.26 [IU]/mL — AB (ref 0.30–0.70)

## 2017-03-10 SURGERY — RIGHT/LEFT HEART CATH AND CORONARY ANGIOGRAPHY
Anesthesia: LOCAL

## 2017-03-10 MED ORDER — ONDANSETRON HCL 4 MG/2ML IJ SOLN: 4.0000 mg | Freq: Four times a day (QID) | INTRAMUSCULAR | Status: DC | PRN

## 2017-03-10 MED ORDER — HEPARIN SODIUM (PORCINE) 1000 UNIT/ML IJ SOLN
INTRAMUSCULAR | Status: AC
  Filled 2017-03-10: qty 1

## 2017-03-10 MED ORDER — SODIUM CHLORIDE 0.9% FLUSH: 3.0000 mL | INTRAVENOUS | Status: DC | PRN

## 2017-03-10 MED ORDER — AMIODARONE HCL 200 MG PO TABS
200.0000 mg | ORAL_TABLET | Freq: Two times a day (BID) | ORAL | Status: DC
  Administered 2017-03-10 – 2017-03-14 (×8): 200 mg via ORAL
  Filled 2017-03-10 (×8): qty 1

## 2017-03-10 MED ORDER — SODIUM CHLORIDE 0.9% FLUSH: 3.0000 mL | Freq: Two times a day (BID) | INTRAVENOUS | Status: DC

## 2017-03-10 MED ORDER — VERAPAMIL HCL 2.5 MG/ML IV SOLN
INTRAVENOUS | Status: DC | PRN
  Administered 2017-03-10: 08:00:00 via INTRA_ARTERIAL

## 2017-03-10 MED ORDER — INFLUENZA VAC SPLIT QUAD 0.5 ML IM SUSY
0.5000 mL | PREFILLED_SYRINGE | INTRAMUSCULAR | Status: AC
  Administered 2017-03-14: 0.5 mL via INTRAMUSCULAR

## 2017-03-10 MED ORDER — DIAZEPAM 5 MG PO TABS
ORAL_TABLET | ORAL | Status: AC
  Filled 2017-03-10: qty 1

## 2017-03-10 MED ORDER — SODIUM CHLORIDE 0.9 % IV SOLN: INTRAVENOUS | Status: AC

## 2017-03-10 MED ORDER — DOBUTAMINE IN D5W 4-5 MG/ML-% IV SOLN
2.5000 ug/kg/min | Freq: Once | INTRAVENOUS | Status: AC
  Administered 2017-03-10: 2.5 ug/kg/min via INTRAVENOUS
  Filled 2017-03-10: qty 250

## 2017-03-10 MED ORDER — HEPARIN (PORCINE) IN NACL 2-0.9 UNIT/ML-% IJ SOLN
INTRAMUSCULAR | Status: AC
  Filled 2017-03-10: qty 1000

## 2017-03-10 MED ORDER — MILRINONE LACTATE IN DEXTROSE 20-5 MG/100ML-% IV SOLN
0.2500 ug/kg/min | INTRAVENOUS | Status: DC
  Administered 2017-03-10: 0.25 ug/kg/min via INTRAVENOUS
  Filled 2017-03-10: qty 100

## 2017-03-10 MED ORDER — SODIUM CHLORIDE 0.9% FLUSH
3.0000 mL | Freq: Two times a day (BID) | INTRAVENOUS | Status: DC
  Administered 2017-03-10 – 2017-03-14 (×7): 3 mL via INTRAVENOUS

## 2017-03-10 MED ORDER — ACETAMINOPHEN 325 MG PO TABS: 650.0000 mg | ORAL_TABLET | ORAL | Status: DC | PRN

## 2017-03-10 MED ORDER — FENTANYL CITRATE (PF) 100 MCG/2ML IJ SOLN
INTRAMUSCULAR | Status: AC
  Filled 2017-03-10: qty 2

## 2017-03-10 MED ORDER — ASPIRIN 81 MG PO CHEW: 81.0000 mg | CHEWABLE_TABLET | ORAL | Status: DC

## 2017-03-10 MED ORDER — POTASSIUM CHLORIDE CRYS ER 20 MEQ PO TBCR
40.0000 meq | EXTENDED_RELEASE_TABLET | Freq: Once | ORAL | Status: AC
  Administered 2017-03-10: 40 meq via ORAL
  Filled 2017-03-10: qty 2

## 2017-03-10 MED ORDER — IOPAMIDOL (ISOVUE-370) INJECTION 76%
INTRAVENOUS | Status: AC
  Filled 2017-03-10: qty 100

## 2017-03-10 MED ORDER — SODIUM CHLORIDE 0.9% FLUSH
10.0000 mL | INTRAVENOUS | Status: DC | PRN
  Administered 2017-03-12: 10 mL
  Filled 2017-03-10: qty 40

## 2017-03-10 MED ORDER — LIDOCAINE HCL (PF) 1 % IJ SOLN
INTRAMUSCULAR | Status: AC
  Filled 2017-03-10: qty 30

## 2017-03-10 MED ORDER — IVABRADINE HCL 5 MG PO TABS
2.5000 mg | ORAL_TABLET | Freq: Two times a day (BID) | ORAL | Status: DC
  Administered 2017-03-10 – 2017-03-14 (×9): 2.5 mg via ORAL
  Filled 2017-03-10 (×9): qty 1

## 2017-03-10 MED ORDER — HEPARIN (PORCINE) IN NACL 2-0.9 UNIT/ML-% IJ SOLN
INTRAMUSCULAR | Status: AC | PRN
  Administered 2017-03-10: 1000 mL via INTRA_ARTERIAL

## 2017-03-10 MED ORDER — DIAZEPAM 5 MG PO TABS
2.5000 mg | ORAL_TABLET | Freq: Once | ORAL | Status: AC
  Administered 2017-03-10: 2.5 mg via ORAL

## 2017-03-10 MED ORDER — ASPIRIN 81 MG PO CHEW
81.0000 mg | CHEWABLE_TABLET | Freq: Every day | ORAL | Status: DC
  Administered 2017-03-11 – 2017-03-14 (×4): 81 mg via ORAL
  Filled 2017-03-10 (×4): qty 1

## 2017-03-10 MED ORDER — SODIUM CHLORIDE 0.9% FLUSH
3.0000 mL | Freq: Two times a day (BID) | INTRAVENOUS | Status: DC
  Administered 2017-03-10 – 2017-03-14 (×6): 3 mL via INTRAVENOUS

## 2017-03-10 MED ORDER — ADULT MULTIVITAMIN W/MINERALS CH
1.0000 | ORAL_TABLET | Freq: Every day | ORAL | Status: DC
  Administered 2017-03-11 – 2017-03-14 (×4): 1 via ORAL
  Filled 2017-03-10 (×4): qty 1

## 2017-03-10 MED ORDER — MIDAZOLAM HCL 2 MG/2ML IJ SOLN
INTRAMUSCULAR | Status: DC | PRN
  Administered 2017-03-10: 1 mg via INTRAVENOUS

## 2017-03-10 MED ORDER — ALPRAZOLAM 0.25 MG PO TABS
0.2500 mg | ORAL_TABLET | Freq: Two times a day (BID) | ORAL | Status: DC | PRN
  Administered 2017-03-10: 0.25 mg via ORAL
  Filled 2017-03-10: qty 1

## 2017-03-10 MED ORDER — SODIUM CHLORIDE 0.9 % IV SOLN
INTRAVENOUS | Status: DC
  Administered 2017-03-10: 06:00:00 via INTRAVENOUS

## 2017-03-10 MED ORDER — LIDOCAINE HCL (PF) 1 % IJ SOLN
INTRAMUSCULAR | Status: DC | PRN
  Administered 2017-03-10 (×2): 2 mL

## 2017-03-10 MED ORDER — FUROSEMIDE 10 MG/ML IJ SOLN
80.0000 mg | Freq: Two times a day (BID) | INTRAMUSCULAR | Status: DC
  Administered 2017-03-10 – 2017-03-13 (×7): 80 mg via INTRAVENOUS
  Filled 2017-03-10 (×7): qty 8

## 2017-03-10 MED ORDER — HEPARIN SODIUM (PORCINE) 1000 UNIT/ML IJ SOLN
INTRAMUSCULAR | Status: DC | PRN
  Administered 2017-03-10: 3500 [IU] via INTRAVENOUS

## 2017-03-10 MED ORDER — SODIUM CHLORIDE 0.9% FLUSH
10.0000 mL | Freq: Two times a day (BID) | INTRAVENOUS | Status: DC
  Administered 2017-03-10: 10 mL
  Administered 2017-03-11: 20 mL
  Administered 2017-03-11 – 2017-03-14 (×6): 10 mL

## 2017-03-10 MED ORDER — SODIUM CHLORIDE 0.9 % IV SOLN: 250.0000 mL | INTRAVENOUS | Status: DC | PRN

## 2017-03-10 MED ORDER — POTASSIUM CHLORIDE CRYS ER 20 MEQ PO TBCR
EXTENDED_RELEASE_TABLET | ORAL | Status: AC
  Filled 2017-03-10: qty 2

## 2017-03-10 MED ORDER — IOPAMIDOL (ISOVUE-370) INJECTION 76%
INTRAVENOUS | Status: DC | PRN
  Administered 2017-03-10: 40 mL via INTRA_ARTERIAL

## 2017-03-10 MED ORDER — MIDAZOLAM HCL 2 MG/2ML IJ SOLN
INTRAMUSCULAR | Status: AC
  Filled 2017-03-10: qty 2

## 2017-03-10 MED ORDER — FENTANYL CITRATE (PF) 100 MCG/2ML IJ SOLN
INTRAMUSCULAR | Status: DC | PRN
  Administered 2017-03-10: 25 ug via INTRAVENOUS

## 2017-03-10 MED ORDER — SODIUM CHLORIDE 0.9 % IV SOLN
250.0000 mL | INTRAVENOUS | Status: DC | PRN
  Administered 2017-03-13: 250 mL via INTRAVENOUS

## 2017-03-10 MED ORDER — HEPARIN (PORCINE) IN NACL 100-0.45 UNIT/ML-% IJ SOLN
1200.0000 [IU]/h | INTRAMUSCULAR | Status: DC
  Administered 2017-03-10: 1050 [IU]/h via INTRAVENOUS
  Administered 2017-03-12 – 2017-03-13 (×2): 1200 [IU]/h via INTRAVENOUS
  Filled 2017-03-10 (×4): qty 250

## 2017-03-10 MED ORDER — VERAPAMIL HCL 2.5 MG/ML IV SOLN
INTRAVENOUS | Status: AC
  Filled 2017-03-10: qty 2

## 2017-03-10 SURGICAL SUPPLY — 17 items
CATH BALLN WEDGE 5F 110CM (CATHETERS) ×2 IMPLANT
CATH INFINITI 5 FR JL3.5 (CATHETERS) ×2 IMPLANT
CATH INFINITI JR4 5F (CATHETERS) ×2 IMPLANT
COVER PRB 48X5XTLSCP FOLD TPE (BAG) ×1 IMPLANT
COVER PROBE 5X48 (BAG) ×1
DEVICE RAD COMP TR BAND LRG (VASCULAR PRODUCTS) ×2 IMPLANT
GLIDESHEATH SLEND SS 6F .021 (SHEATH) ×2 IMPLANT
GUIDEWIRE INQWIRE 1.5J.035X260 (WIRE) ×1 IMPLANT
INQWIRE 1.5J .035X260CM (WIRE) ×2
KIT HEART LEFT (KITS) ×2 IMPLANT
KIT MICROINTRODUCER STIFF 5F (SHEATH) ×2 IMPLANT
PACK CARDIAC CATHETERIZATION (CUSTOM PROCEDURE TRAY) ×2 IMPLANT
SHEATH GLIDE SLENDER 4/5FR (SHEATH) ×2 IMPLANT
TRANSDUCER W/STOPCOCK (MISCELLANEOUS) ×2 IMPLANT
TUBING CIL FLEX 10 FLL-RA (TUBING) ×2 IMPLANT
WIRE EMERALD 3MM-J .025X260CM (WIRE) ×2 IMPLANT
WIRE HI TORQ VERSACORE-J 145CM (WIRE) ×2 IMPLANT

## 2017-03-10 NOTE — Progress Notes (Signed)
TR band removed from R Radial artery after incrementally decreasing air following cardiac catheterization. Site is level 0 with no bleeding, hematoma, or discoloration. Site dressed with 2x2 and tegaderm. Pt given instructions and arm continues to be elevated.

## 2017-03-10 NOTE — Progress Notes (Signed)
ANTICOAGULATION CONSULT NOTE - Initial Consult  Pharmacy Consult for IV heparin (Eliquis stopped) Indication: atrial fibrillation  Allergies  Allergen Reactions  . Plavix [Clopidogrel Bisulfate] Hives    Patient Measurements: Height: 5\' 5"  (165.1 cm) Weight: 162 lb (73.5 kg) IBW/kg (Calculated) : 61.5 Heparin Dosing Weight: 73.5 kg  Vital Signs: Temp: 97.6 F (36.4 C) (11/16 0535) Temp Source: Oral (11/16 0535) BP: 89/52 (11/16 1240) Pulse Rate: 59 (11/16 1240)  Labs: Recent Labs    03/08/17 1546 03/10/17 0552  HGB 12.6*  --   HCT 39.7  --   PLT 332  --   LABPROT 18.5*  --   INR 1.55  --   CREATININE 1.72* 1.82*    Estimated Creatinine Clearance: 41.3 mL/min (A) (by C-G formula based on SCr of 1.82 mg/dL (H)).   Medical History: Past Medical History:  Diagnosis Date  . AICD (automatic cardioverter/defibrillator) present   . CAD (coronary artery disease) 2009   AMI in 12/2007 with PCI to LAD, staged PCI to  mid/distal RCA, NSTEMI in 02/2009 with BMS to LCx  . CHF (congestive heart failure) ([place])   . Chronic kidney disease 11/03/2016   stage 3 kidney disease  . HLD (hyperlipidemia)   . HTN (hypertension)   . Ischemic cardiomyopathy    Admitted in 07/2010 with CHF exacerbation  . Myocardial infarction ([place])   . Seizures ([place]) 11/03/2016   one seizure in 04/2016 during cardiac event    Medications:  Infusions:  . sodium chloride    . sodium chloride 10 mL/hr at 03/10/17 0602  . sodium chloride 200 mL (03/10/17 0934)  . milrinone 0.25 mcg/kg/min (03/10/17 1003)    Assessment: 51 yo male admitted after cath lab for milrinone initiation.  On Eliquis PTA, currently stopped and pharmacy asked to begin IV heparin 8 hrs after sheath removed.  Sheath out at 0906 AM.  Heparin levels will likely be falsely elevated given recent Eliquis use.  Will adjust heparin gtt based on PTTs.  Goal of Therapy:  Heparin level 0.3-0.7 units/ml aPTT 66-102 seconds Monitor  platelets by anticoagulation protocol: Yes   Plan:  1. Baseline heparin level and PTT now. 2. Start IV heparin at 5 PM at 1050 units/hr. 3. Check PTT 6 hrs after gtt starts. 4. Daily heparin level and PTT. 5. F/u plans for LVAD.  [name], BCPS  Clinical Pharmacist Pager [phone]  03/10/2017 1:02 PM

## 2017-03-10 NOTE — Progress Notes (Addendum)
1330 Pt arrived to floor via stretcher, pt A&Ox4, VSS, right radial punctures x2 CDI, no hematomas or complications noted. Pt denies pain, SOB. Pt assessed and oriented to floor. Pt updated with POC. Lunch tray ordered. Will continue to monitor.   1600 PICC team here to place line. RN called to room twice, pt c/o that he, "can't breath" and feeling like "heart was racing". PICC completed procedure and pt up to side of bed. RN at bedside with pt.   1830 Pt resting in bed, NAD, no complaints, awaiting new nurse for shift report.

## 2017-03-10 NOTE — Interval H&P Note (Signed)
History and Physical Interval Note:  03/10/2017 8:17 AM  David [name]  has presented today for surgery, with the diagnosis of chf, sob, CAD  The various methods of treatment have been discussed with the patient and family. After consideration of risks, benefits and other options for treatment, the patient has consented to  Procedure(s): RIGHT/LEFT HEART CATH AND CORONARY ANGIOGRAPHY (N/A) and possible coronary angioplasty as a surgical intervention .  The patient's history has been reviewed, patient examined, no change in status, stable for surgery.  I have reviewed the patient's chart and labs.  Questions were answered to the patient's satisfaction.     Daniel Bensimhon

## 2017-03-10 NOTE — Progress Notes (Signed)
Peripherally Inserted Central Catheter/Midline Placement  The IV Nurse has discussed with the patient and/or persons authorized to consent for the patient, the purpose of this procedure and the potential benefits and risks involved with this procedure.  The benefits include less needle sticks, lab draws from the catheter, and the patient may be discharged home with the catheter. Risks include, but not limited to, infection, bleeding, blood clot (thrombus formation), and puncture of an artery; nerve damage and irregular heartbeat and possibility to perform a PICC exchange if needed/ordered by physician.  Alternatives to this procedure were also discussed.  Bard Power PICC patient education guide, fact sheet on infection prevention and patient information card has been provided to patient /or left at bedside.    PICC/Midline Placement Documentation  PICC Single Lumen 03/10/17 PICC Right Brachial 41 cm 0 cm (Active)  Indication for Insertion or Continuance of Line Vasoactive infusions 03/10/2017  4:00 PM  Exposed Catheter (cm) 0 cm 03/10/2017  4:00 PM  Dressing Change Due 03/17/17 03/10/2017  4:00 PM       Christella Noa Albarece 03/10/2017, 4:23 PM

## 2017-03-10 NOTE — H&P (Signed)
Advanced Heart Failure Team History and Physical Note   Primary Physician:   Primary Cardiologist: Dr Haroldine Laws     Reason for Admission: A/C Systolic Heart Failure    HPI:    David [name] a 51 y.o.malew PMH of ICM ('09 PCI to LAD, mRCA, '10 PCI to Lcx and '12 mRCA), HFrEF (EF ~20% for >5 years), HLD and HTN who presented to [facility] on 04/29/16 after ~ 2 weeks of worsening SOB, DOE, and orthopnea.   Admitted to [facility] ED 04/29/16 for Afib/AFL. Had Brady/PEA arrest, was intubated, started on pressors and transferred to [facility]. Pressors weaned as tolerated. Extubated 05/01/16.Repeat Echo showed LVEF 15-20%.   Cath on 05/05/16 (full report below) with 3V CAD and high grade lesion RCA and LAD. PCWP 37 so diuresed for optimization prior to PCI.  Pt underwent successful stenting of the proximal to mid RCA and PLOM with DES x 2 and successful stenting of the mid LAD with DES 05/09/16. Placed on ASA and Brilinta for 30 days, and then to transition off ASA and onto Eliquis/Xarelto.   Pt noted to have 2 episodes of bradycardia into the 20s with lightheadedness s/p cath. No further episodes after several hours. No intervention required. EP consulted to consider ICD with brady episodes. Felt like more vagal induced and deferred CRT-D consideration for outpatient. Discharge weight 152 lbs.  Admitted 07/27/16-08/02/16 for palpitations and weakness. Found to have frequent PVCs. Ranexa added to amiodarone. ICD placed - unable to get LV lead placed. Has been referred for epicardial lead with Dr. Cyndia Bent Digoxin and spiro stopped due to worsening renal function.   Pt underwent left thoracotomy and insertion of two LV epicardial leads and revision of his CRT system to BiV on 11/07/2016. He saw Dr. Cyndia Bent in Follow up 02/01/2017.  Presented to for RHC/LHC. Ongoing SOB with exertion and fatigue. No chest pain.   ECHO 11/14 EF 15-20%   RHC/LHC 03/10/2017   Dist LAD lesion is 80%  stenosed.  Ost Cx to Mid Cx lesion is 40% stenosed.  Dist RCA lesion is 40% stenosed.  Previously placed Prox RCA to Mid RCA stent (unknown type) is widely patent.  Mid RCA lesion is 40% stenosed.  Previously placed Post Atrio stent (unknown type) is widely patent.  Acute Mrg lesion is 99% stenosed.  Previously placed Prox LAD to Dist LAD stent (unknown type) is widely patent. Prox LAD lesion is 40% stenosed. Ao = 95/55 (71) LV = 93/32 RA = 12 RV = 62/17 PA = 60/27 (37) PCW = 22 (v= 35) Fick cardiac output/index = 3.0/1.6 PVR =5.0 WU SVR = 1580 Ao sat = 98% PA sat = 50%, 52% PaPI = 2/75 RA/PCW = 0.54 RA/LVEDP = 0.38 Review of Systems: [y] = yes, [ ]  = no   General: Weight gain [ ] ; Weight loss [ ] ; Anorexia [ ] ; Fatigue [Y ]; Fever [ ] ; Chills [ ] ; Weakness [ ]   Cardiac: Chest pain/pressure [ ] ; Resting SOB [ ] ; Exertional SOB [ Y]; Orthopnea [ ] ; Pedal Edema [ ] ; Palpitations [ ] ; Syncope [ ] ; Presyncope [ ] ; Paroxysmal nocturnal dyspnea[ ]   Pulmonary: Cough [ ] ; Wheezing[ ] ; Hemoptysis[ ] ; Sputum [ ] ; Snoring [ ]   GI: Vomiting[ ] ; Dysphagia[ ] ; Melena[ ] ; Hematochezia [ ] ; Heartburn[ ] ; Abdominal pain [ ] ; Constipation [ ] ; Diarrhea [ ] ; BRBPR [ ]   GU: Hematuria[ ] ; Dysuria [ ] ; Nocturia[ ]   Vascular: Pain in legs with walking [ ] ; Pain in  feet with lying flat [ ] ; Non-healing sores [ ] ; Stroke [ ] ; TIA [ ] ; Slurred speech [ ] ;  Neuro: Headaches[ ] ; Vertigo[ ] ; Seizures[ ] ; Paresthesias[ ] ;Blurred vision [ ] ; Diplopia [ ] ; Vision changes [ ]   Ortho/Skin: Arthritis [ ] ; Joint pain [Y ]; Muscle pain [ ] ; Joint swelling [ ] ; Back Pain [ Y]; Rash [ ]   Psych: Depression[ ] ; Anxiety[ ]   Heme: Bleeding problems [ ] ; Clotting disorders [ ] ; Anemia [ ]   Endocrine: Diabetes [ ] ; Thyroid dysfunction[ ]    Home Medications Prior to Admission medications   Medication Sig Start Date End Date Taking? Authorizing Provider  amiodarone (PACERONE) 200 MG tablet Take 200 mg by mouth 2  (two) times daily. 09/23/16  Yes [provider]  apixaban (ELIQUIS) 5 MG TABS tablet Take 1 tablet (5 mg total) by mouth 2 (two) times daily. 11/11/16  Yes Gold, Wilder Glade, PA-C  aspirin 81 MG chewable tablet Chew 1 tablet (81 mg total) by mouth daily. 05/13/16  Yes Shirley Friar, PA-C  carvedilol (COREG) 3.125 MG tablet TAKE 1 TABLET BY MOUTH TWICE DAILY -  NEEDS  OFFICE  VISIT. 02/23/17  Yes Daniyah Fohl, Shaune Pascal, MD  furosemide (LASIX) 40 MG tablet Take 40 mg by mouth 2 (two) times daily. 09/23/16  Yes [provider]  ivabradine (CORLANOR) 5 MG TABS tablet Take 0.5 tablets (2.5 mg total) by mouth 2 (two) times daily with a meal. 08/02/16  Yes Dessa Phi, DO  linaclotide (LINZESS) 290 MCG CAPS capsule Take 290 mcg by mouth daily as needed (constipation).   Yes [provider]  Multiple Vitamins-Minerals (ADULT GUMMY) CHEW Chew 2 tablets by mouth daily.   Yes [provider]    Past Medical History: Past Medical History:  Diagnosis Date  . AICD (automatic cardioverter/defibrillator) present   . CAD (coronary artery disease) 2009   AMI in 12/2007 with PCI to LAD, staged PCI to  mid/distal RCA, NSTEMI in 02/2009 with BMS to LCx  . CHF (congestive heart failure) ([place])   . Chronic kidney disease 11/03/2016   stage 3 kidney disease  . HLD (hyperlipidemia)   . HTN (hypertension)   . Ischemic cardiomyopathy    Admitted in 07/2010 with CHF exacerbation  . Myocardial infarction ([place])   . Seizures ([place]) 11/03/2016   one seizure in 04/2016 during cardiac event    Past Surgical History: Past Surgical History:  Procedure Laterality Date  . BiV ICD Insertion CRT-D N/A 08/01/2016   Performed by Constance Haw, MD at [place] [facility]  . Coronary Stent Intervention N/A 05/09/2016   Performed by Martinique, Peter M, MD at [place] [facility]  . Electrophysiology Study N/A 06/24/2016   Performed by Deboraha Sprang, MD at [place] [facility]  . Intravascular  Pressure Wire/FFR Study N/A 05/09/2016   Performed by Martinique, Peter M, MD at [place] [facility]  . LEFT HEART CATHETERIZATION WITH CORONARY ANGIOGRAM N/A 03/13/2011   Performed by Lorretta Harp, MD at [facility] CATH LAB  . LV EPICARDIAL PACING LEAD PLACEMENT VIA LEFT MINI THORACOTOMY Left 11/07/2016   Performed by Gaye Pollack, MD at [facility] OR  . PERCUTANEOUS CORONARY STENT INTERVENTION (PCI-S)  03/13/2011   Performed by Lorretta Harp, MD at [facility] CATH LAB  . POSSIBLE  ICD Implant N/A 06/24/2016   Performed by Deboraha Sprang, MD at [place] [facility]  . Right/Left Heart Cath and Coronary Angiography N/A 05/05/2016  Performed by Jolaine Artist, MD at [facility] INVASIVE CV LAB    Family History:  Family History  Problem Relation Age of Onset  . Coronary artery disease Father   . Hypertension Father   . Hypertension Sister   . Coronary artery disease Brother   . Emphysema Brother   . Coronary artery disease Mother   . Hypertension Mother   . Emphysema Mother   . Hypertension Daughter   . Obesity Daughter   . Diabetes Unknown   . Hypertension Unknown   . Coronary artery disease Unknown     Social History: Social History   Socioeconomic History  . Marital status: Married    Spouse name: Not on file  . Number of children: Not on file  . Years of education: Not on file  . Highest education level: Not on file  Social Needs  . Financial resource strain: Not on file  . Food insecurity - worry: Not on file  . Food insecurity - inability: Not on file  . Transportation needs - medical: Not on file  . Transportation needs - non-medical: Not on file  Occupational History  . Not on file  Tobacco Use  . Smoking status: Former Smoker    Packs/day: 0.20    Years: 30.00    Pack years: 6.00    Types: Cigars    Last attempt to quit: 04/29/2016    Years since quitting: 0.8  . Smokeless tobacco: Never Used  . Tobacco comment: Used to smoke cigarettes. Pt is currently smoking 3 black & mild  cigars per day. He has cut down but never quit.  Substance and Sexual Activity  . Alcohol use: No  . Drug use: No  . Sexual activity: Not on file  Other Topics Concern  . Not on file  Social History Narrative  . Not on file    Allergies:  Allergies  Allergen Reactions  . Plavix [Clopidogrel Bisulfate] Hives    Objective:    Vital Signs:   Temp:  [97.6 F (36.4 C)] 97.6 F (36.4 C) (11/16 0535) Pulse Rate:  [56-65] 57 (11/16 0907) Resp:  [11-36] 29 (11/16 0925) BP: (91-114)/(67-81) 91/70 (11/16 0925) SpO2:  [92 %-100 %] 97 % (11/16 0907) Weight:  [162 lb (73.5 kg)] 162 lb (73.5 kg) (11/16 0535)   Filed Weights   03/10/17 0535  Weight: 162 lb (73.5 kg)     Physical Exam     General:  Appears chronically ill  No respiratory difficulty HEENT: Normal Neck: Supple. JVP to jaw. Carotids 2+ bilat; no bruits. No lymphadenopathy or thyromegaly appreciated. Cor: PMI nondisplaced. Regular rate & rhythm. No rubs, or murmurs.+ S3  Lungs: Clear Abdomen: Soft, nontender, nondistended. No hepatosplenomegaly. No bruits or masses. Good bowel sounds. Extremities: No cyanosis, clubbing, rash, edema Neuro: Alert & oriented x 3, cranial nerves grossly intact. moves all 4 extremities w/o difficulty. Affect pleasant.   Telemetry  A sensed V paced 60 bpm    EKG  A Sensed V paced 60 bpm   Labs     Basic Metabolic Panel: Recent Labs  Lab 03/08/17 1546 03/10/17 0552  NA 139 139  K 4.0 3.0*  CL 105 103  CO2 26 26  GLUCOSE 104* 84  BUN 17 17  CREATININE 1.72* 1.82*  CALCIUM 9.1 8.9    Liver Function Tests: No results for input(s): AST, ALT, ALKPHOS, BILITOT, PROT, ALBUMIN in the last 168 hours. No results for input(s): LIPASE, AMYLASE in the last 168  hours. No results for input(s): AMMONIA in the last 168 hours.  CBC: Recent Labs  Lab 03/08/17 1546  WBC 11.7*  HGB 12.6*  HCT 39.7  MCV 78.3  PLT 332    Cardiac Enzymes: No results for input(s): CKTOTAL, CKMB,  CKMBINDEX, TROPONINI in the last 168 hours.  BNP: BNP (last 3 results) Recent Labs    05/03/16 0435 05/19/16 1554 07/27/16 1930  BNP 1,034.8* 470.0* 321.6*    ProBNP (last 3 results) No results for input(s): PROBNP in the last 8760 hours.   CBG: No results for input(s): GLUCAP in the last 168 hours.  Coagulation Studies: Recent Labs    03/08/17 1546  LABPROT 18.5*  INR 1.55    Imaging:  No results found.   Patient Profile    Assessment/Plan   1. Acute/Chronic systolic HF  -Echo 0/06/2120 LVEF 25%, Mild AI, Mild MR, Mod LAE. - Echo performed at bedside by Dr. Haroldine Laws on 11/14 15-20%. Repeat ECHO.  -RHC with volume overload and low output heart failure.  -start milrinone 0.25 mcg.  - Diurese with IV lasix. .  - No ACE/ARB/ARNI with CKD  - No spiro or digoxin with CKD.  - Continue ivabradine 2.5mg  daily.  - Hold bb.   - Could not tolerate Bidil, caused hypotension.  - 2. CAD s/p PCI to mid RCA and PLOM with DES x 2 and DES to mid LAD in 1/18.  - Continue ASA and Brilinta  3. PAF - Maintaining NSR. Continue Amiodarone.  - Continue eliquis 5 mg BID.  4. CKD stage III - check BMEt  - No nephrotoxic medications as above.  5. Tobacco abuse. - Stopped smoking in January.  6. LBBB - Now s/p CRT-D upgrade in July 2018. 7. Frequent PVCs -Continue ranexa and amio.    Admit from cath lab. Start milrinone. VAD coordinators will follow up today to start VAD work up.    Darrick Grinder, NP 03/10/2017, 10:08 AM  Advanced Heart Failure Team Pager [phone] (M-F; [place])  Please contact [facility] for night-coverage after hours (4p -7a ) and weekends on [email or web address]  Patient seen and examined with Darrick Grinder, NP. We discussed all aspects of the encounter. I agree with the assessment and plan as stated above.   Cath results reviewed with him and his family at length. He has low output physiology with volume overload. Will admit for inotropic support and diuresis.  Will also discuss with EP regarding reprogramming of CRT to see if this might help.  Case discussed with VAD team as well who will see him today.   Glori Bickers, MD  5:58 PM

## 2017-03-10 NOTE — Progress Notes (Signed)
Have had discussion with patient about the risks, benefits, indications for the Heartmate 3 VAD placement. I have reviewed in full the following; caregiver role, survival, complications, life style changes and follow up care. The patient was provided with educational resources to take home and read more about the device offered. The patient understands that from this discussion it does not mean that they will receive the device. We will follow up with the pt as Dr. Haroldine Laws directs.  Tanda Rockers RN, BSN VAD Coordinator 24/7 Pager [phone]

## 2017-03-11 ENCOUNTER — Inpatient Hospital Stay (HOSPITAL_COMMUNITY): Payer: Medicare HMO

## 2017-03-11 ENCOUNTER — Other Ambulatory Visit (HOSPITAL_COMMUNITY): Payer: Medicare HMO

## 2017-03-11 DIAGNOSIS — I351 Nonrheumatic aortic (valve) insufficiency: Secondary | ICD-10-CM

## 2017-03-11 LAB — ECHOCARDIOGRAM COMPLETE
AVPHT: 309 ms
Ao-asc: 29 cm
CHL CUP MV DEC (S): 151
CHL CUP PV REG GRAD DIAS: 9 mmHg
CHL CUP RV SYS PRESS: 56 mmHg
CHL CUP TV REG PEAK VELOCITY: 320 cm/s
E decel time: 151 msec
FS: 9 % — AB (ref 28–44)
HEIGHTINCHES: 65 in
IV/PV OW: 0.9
LA ID, A-P, ES: 54 mm
LA diam end sys: 54 mm
LA diam index: 2.98 cm/m2
LA vol A4C: 109 ml
LA vol: 106 mL
LAVOLIN: 58.5 mL/m2
LDCA: 3.14 cm2
LV PW d: 10 mm — AB (ref 0.6–1.1)
LVOTD: 20 mm
MRPISAEROA: 0.17 cm2
MV Peak grad: 6 mmHg
MV VTI: 111 cm
MVAP: 5.12 cm2
MVPKEVEL: 122 m/s
P 1/2 time: 44 ms
PV Reg vel dias: 148 cm/s
RV TAPSE: 17.3 mm
TR max vel: 320 cm/s
WEIGHTICAEL: 2486.79 [oz_av]

## 2017-03-11 LAB — CBC
HCT: 37.3 % — ABNORMAL LOW (ref 39.0–52.0)
Hemoglobin: 11.8 g/dL — ABNORMAL LOW (ref 13.0–17.0)
MCH: 24.7 pg — AB (ref 26.0–34.0)
MCHC: 31.6 g/dL (ref 30.0–36.0)
MCV: 78.2 fL (ref 78.0–100.0)
PLATELETS: 313 10*3/uL (ref 150–400)
RBC: 4.77 MIL/uL (ref 4.22–5.81)
RDW: 16.4 % — AB (ref 11.5–15.5)
WBC: 9.6 10*3/uL (ref 4.0–10.5)

## 2017-03-11 LAB — BASIC METABOLIC PANEL
ANION GAP: 8 (ref 5–15)
BUN: 13 mg/dL (ref 6–20)
CALCIUM: 8.6 mg/dL — AB (ref 8.9–10.3)
CO2: 27 mmol/L (ref 22–32)
CREATININE: 1.44 mg/dL — AB (ref 0.61–1.24)
Chloride: 103 mmol/L (ref 101–111)
GFR calc non Af Amer: 54 mL/min — ABNORMAL LOW (ref >60)
Glucose, Bld: 101 mg/dL — ABNORMAL HIGH (ref 65–99)
Potassium: 3.6 mmol/L (ref 3.5–5.1)
Sodium: 138 mmol/L (ref 135–145)

## 2017-03-11 LAB — COOXEMETRY PANEL
CARBOXYHEMOGLOBIN: 1.5 % (ref 0.5–1.5)
METHEMOGLOBIN: 0.9 % (ref 0.0–1.5)
O2 Saturation: 36.4 %
Total hemoglobin: 12.8 g/dL (ref 12.0–16.0)

## 2017-03-11 LAB — APTT: APTT: 70 s — AB (ref 24–36)

## 2017-03-11 LAB — HEPARIN LEVEL (UNFRACTIONATED): Heparin Unfractionated: 0.59 IU/mL (ref 0.30–0.70)

## 2017-03-11 MED ORDER — LOSARTAN POTASSIUM 25 MG PO TABS
12.5000 mg | ORAL_TABLET | Freq: Every day | ORAL | Status: DC
  Administered 2017-03-11 – 2017-03-14 (×4): 12.5 mg via ORAL
  Filled 2017-03-11 (×4): qty 1

## 2017-03-11 MED ORDER — DOBUTAMINE IN D5W 4-5 MG/ML-% IV SOLN
5.0000 ug/kg/min | INTRAVENOUS | Status: DC
  Administered 2017-03-11 – 2017-03-13 (×2): 5 ug/kg/min via INTRAVENOUS
  Filled 2017-03-11: qty 250

## 2017-03-11 MED ORDER — ATORVASTATIN CALCIUM 40 MG PO TABS
40.0000 mg | ORAL_TABLET | Freq: Every day | ORAL | Status: DC
  Administered 2017-03-11 – 2017-03-14 (×4): 40 mg via ORAL
  Filled 2017-03-11 (×4): qty 1

## 2017-03-11 NOTE — Progress Notes (Addendum)
Advanced Heart Failure Rounding Note   Subjective:    PICC line placed. Remains on dobutamine. Diuresed well. Weight down 7 pounds. Denies SOB.   Co-ox 34% this am.   Objective:   Weight Range:  Vital Signs:   Temp:  [97.3 F (36.3 C)-98.3 F (36.8 C)] 97.3 F (36.3 C) (11/17 0758) Pulse Rate:  [54-86] 70 (11/17 0758) Resp:  [12-33] 23 (11/17 0758) BP: (85-154)/(47-108) 107/72 (11/17 0758) SpO2:  [90 %-100 %] 95 % (11/17 0758) Weight:  [70.5 kg (155 lb 6.8 oz)] 70.5 kg (155 lb 6.8 oz) (11/17 0616) Last BM Date: 03/10/17  Weight change: Filed Weights   03/10/17 0535 03/11/17 0616  Weight: 73.5 kg (162 lb) 70.5 kg (155 lb 6.8 oz)    Intake/Output:   Intake/Output Summary (Last 24 hours) at 03/11/2017 0911 Last data filed at 03/11/2017 0620 Gross per 24 hour  Intake 971.22 ml  Output 1250 ml  Net -278.78 ml     Physical Exam: General:  Sitting in chair No resp difficulty HEENT: normal Neck: supple. JVP 7-8 . Carotids 2+ bilat; no bruits. No lymphadenopathy or thryomegaly appreciated. Cor: PMI laterally displaced. Regular rate & rhythm. No rubs, gallops or murmurs. Lungs: clear Abdomen: soft, nontender, nondistended. No hepatosplenomegaly. No bruits or masses. Good bowel sounds. Extremities: no cyanosis, clubbing, rash, edema Neuro: alert & orientedx3, cranial nerves grossly intact. moves all 4 extremities w/o difficulty. Affect pleasant  Telemetry: NSR with v pacing in 70s. Personally reviewed   Labs: Basic Metabolic Panel: Recent Labs  Lab 03/08/17 1546 03/10/17 0552 03/11/17 0618  NA 139 139 138  K 4.0 3.0* 3.6  CL 105 103 103  CO2 26 26 27   GLUCOSE 104* 84 101*  BUN 17 17 13   CREATININE 1.72* 1.82* 1.44*  CALCIUM 9.1 8.9 8.6*    Liver Function Tests: No results for input(s): AST, ALT, ALKPHOS, BILITOT, PROT, ALBUMIN in the last 168 hours. No results for input(s): LIPASE, AMYLASE in the last 168 hours. No results for input(s): AMMONIA in  the last 168 hours.  CBC: Recent Labs  Lab 03/08/17 1546 03/11/17 0618  WBC 11.7* 9.6  HGB 12.6* 11.8*  HCT 39.7 37.3*  MCV 78.3 78.2  PLT 332 313    Cardiac Enzymes: No results for input(s): CKTOTAL, CKMB, CKMBINDEX, TROPONINI in the last 168 hours.  BNP: BNP (last 3 results) Recent Labs    05/03/16 0435 05/19/16 1554 07/27/16 1930  BNP 1,034.8* 470.0* 321.6*    ProBNP (last 3 results) No results for input(s): PROBNP in the last 8760 hours.    Other results:  Imaging: Dg Chest Port 1 View  Result Date: 03/10/2017 CLINICAL DATA:  Bedside PICC placement.  Shortness of breath. EXAM: PORTABLE CHEST 1 VIEW COMPARISON:  02/21/2017, 02/01/2017 and earlier. FINDINGS: Right arm PICC tip projects at or near the cavoatrial junction. Cardiac silhouette markedly enlarged. Left subclavian transvenous pacemaker unchanged and appears intact. Mild perihilar airspace pulmonary edema, asymmetric and increased in the right lung, new since the most recent prior examination. No visible pleural effusions. IMPRESSION: 1. Right arm PICC tip projects at or near the cavoatrial junction. 2. Marked cardiomegaly. Mild perihilar airspace pulmonary edema, asymmetric and increased on the right, indicating mild acute CHF. Electronically Signed   By: Evangeline Dakin M.D.   On: 03/10/2017 16:58      Medications:     Scheduled Medications: . amiodarone  200 mg Oral BID  . aspirin  81 mg Oral Daily  .  furosemide  80 mg Intravenous BID  . Influenza vac split quadrivalent PF  0.5 mL Intramuscular Tomorrow-1000  . ivabradine  2.5 mg Oral BID WC  . multivitamin with minerals  1 tablet Oral Daily  . sodium chloride flush  10-40 mL Intracatheter Q12H  . sodium chloride flush  3 mL Intravenous Q12H  . sodium chloride flush  3 mL Intravenous Q12H     Infusions: . sodium chloride    . sodium chloride    . 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 Started on milrinone but switched to dobutamine due to hypotension.   Plan/Discussion:     1. Acute/Chronic systolic HFwith low output - Echo 07/29/2016 LVEF 25%, Mild AI, Mild MR, Mod LAE. - Echo performed at bedside on 11/14 15-20%. Repeat ECHO.  - RHC with volume overload and low output heart failure.  - Failed milrinone due to hypotension. Now on dobutamine. Has diuresed well and renal function improving but co-ox very low. Will repeat. Set up CVP line.  - We again discussed advanced therapies including VAD and he remains very reluctant.  - Continue IV lasix one more day. Check CVPs. - Has been off ACE/ARB/ARNI with CKD and low BP. Start losartan 12.5mg  daily.  - No spiro or digoxin with CKD.  -Continueivabradine 2.5mg  daily.  -Holding bb with low output.  - Could not tolerate Bidil, caused hypotension. 2. CAD s/p PCI to mid RCA and PLOM with DES x 2 and DES to mid LAD in 1/18.  - Continue ASA. Unclear why he is not on a statin. Will start.  - has been off Brilinta with epicardial lead and possible need for mechanical support 3. PAF - Maintaining NSR. Continue Amiodarone. - Continue eliquis 5 mg BID. 4. Acute on CKD stage III -renal function improved with inotropic support 5. Tobacco abuse. -Stopped smoking in January. 6. LBBB -Now s/p CRT-D upgrade in July 2018 with epicardial pacing. Will discuss reprogramming with EP on Monday. 7. Frequent PVCs -Continue ranexa and amio.   Length of Stay: 1   Glori Bickers MD 03/11/2017, 9:11 AM  Advanced Heart Failure Team Pager [phone] (M-F; [place])  Please contact [facility] for night-coverage after hours (4p -7a ) and weekends on  [email or web address]  Addendum:  Spoke with RN on 2C and he has not been receiving dobutamine. I checked with PharmD and apparently dobutamine ordered in cath lab as verbal order (from me) and placed in computer as one time dose so it had ended. Will restart now.   Glori Bickers, MD  1:11 PM

## 2017-03-11 NOTE — Progress Notes (Signed)
ANTICOAGULATION CONSULT NOTE - Follow Up Consult  Pharmacy Consult for heparin Indication: atrial fibrillation  Labs: Recent Labs    03/08/17 1546 03/10/17 0552 03/10/17 1406 03/10/17 2233  HGB 12.6*  --   --   --   HCT 39.7  --   --   --   PLT 332  --   --   --   APTT  --   --  32 55*  LABPROT 18.5*  --   --   --   INR 1.55  --   --   --   HEPARINUNFRC  --   --  0.26*  --   CREATININE 1.72* 1.82*  --   --     Assessment: 50yo male subtherapeutic on heparin with initial dosing while Eliquis on hold.  Goal of Therapy:  aPTT 66-102 seconds   Plan:  Will increase heparin gtt by 2 units/kg/hr to 1200 units/hr and check PTT in 8hr.  Wynona Neat, PharmD, BCPS  03/11/2017,1:40 AM

## 2017-03-11 NOTE — Progress Notes (Signed)
  Echocardiogram 2D Echocardiogram has been performed.  Audwin Semper G Clariece Roesler 03/11/2017, 10:27 AM

## 2017-03-11 NOTE — Progress Notes (Signed)
[place] for IV heparin (Eliquis stopped) Indication: atrial fibrillation  Allergies  Allergen Reactions  . Plavix [Clopidogrel Bisulfate] Hives    Patient Measurements: Height: 5\' 5"  (165.1 cm) Weight: 155 lb 6.8 oz (70.5 kg) IBW/kg (Calculated) : 61.5 Heparin Dosing Weight: 73.5 kg  Vital Signs: Temp: 97.4 F (36.3 C) (11/17 1156) Temp Source: Oral (11/17 1156) BP: 93/57 (11/17 1156) Pulse Rate: 64 (11/17 1156)  Labs: Recent Labs    03/08/17 1546 03/10/17 0552 03/10/17 1406 03/10/17 2233 03/11/17 0618 03/11/17 0933  HGB 12.6*  --   --   --  11.8*  --   HCT 39.7  --   --   --  37.3*  --   PLT 332  --   --   --  313  --   APTT  --   --  32 55*  --  70*  LABPROT 18.5*  --   --   --   --   --   INR 1.55  --   --   --   --   --   HEPARINUNFRC  --   --  0.26*  --   --  0.59  CREATININE 1.72* 1.82*  --   --  1.44*  --     Estimated Creatinine Clearance: 52.2 mL/min (A) (by C-G formula based on SCr of 1.44 mg/dL (H)).   Medical History: Past Medical History:  Diagnosis Date  . AICD (automatic cardioverter/defibrillator) present   . CAD (coronary artery disease) 2009   AMI in 12/2007 with PCI to LAD, staged PCI to  mid/distal RCA, NSTEMI in 02/2009 with BMS to LCx  . CHF (congestive heart failure) ([place])   . Chronic kidney disease 11/03/2016   stage 3 kidney disease  . HLD (hyperlipidemia)   . HTN (hypertension)   . Ischemic cardiomyopathy    Admitted in 07/2010 with CHF exacerbation  . Myocardial infarction ([place])   . Seizures ([place]) 11/03/2016   one seizure in 04/2016 during cardiac event    Medications:  Infusions:  . sodium chloride    . sodium chloride    . heparin 1,200 Units/hr (03/11/17 7530)    Assessment: 51 yo male admitted after cath lab for milrinone initiation. On Eliquis PTA, currently stopped.   Heparin level and aptt within goal range, will follow heparin levels only for now. CBC within normal  limits.  Goal of Therapy:  Heparin level 0.3-0.7 units/ml aPTT 66-102 seconds Monitor platelets by anticoagulation protocol: Yes   Plan:  Continue heparin at 1200 units/hr Daily Heparin level and cbc  Erin Hearing PharmD., BCPS Clinical Pharmacist Pager [phone] 03/11/2017 12:37 PM

## 2017-03-12 DIAGNOSIS — I480 Paroxysmal atrial fibrillation: Secondary | ICD-10-CM

## 2017-03-12 LAB — CBC
HCT: 34.5 % — ABNORMAL LOW (ref 39.0–52.0)
Hemoglobin: 10.8 g/dL — ABNORMAL LOW (ref 13.0–17.0)
MCH: 24.3 pg — AB (ref 26.0–34.0)
MCHC: 31.3 g/dL (ref 30.0–36.0)
MCV: 77.7 fL — AB (ref 78.0–100.0)
PLATELETS: 279 10*3/uL (ref 150–400)
RBC: 4.44 MIL/uL (ref 4.22–5.81)
RDW: 16.4 % — ABNORMAL HIGH (ref 11.5–15.5)
WBC: 9.8 10*3/uL (ref 4.0–10.5)

## 2017-03-12 LAB — BASIC METABOLIC PANEL
Anion gap: 6 (ref 5–15)
BUN: 11 mg/dL (ref 6–20)
CALCIUM: 8.2 mg/dL — AB (ref 8.9–10.3)
CO2: 28 mmol/L (ref 22–32)
CREATININE: 1.42 mg/dL — AB (ref 0.61–1.24)
Chloride: 104 mmol/L (ref 101–111)
GFR, EST NON AFRICAN AMERICAN: 55 mL/min — AB (ref >60)
Glucose, Bld: 91 mg/dL (ref 65–99)
Potassium: 2.9 mmol/L — ABNORMAL LOW (ref 3.5–5.1)
SODIUM: 138 mmol/L (ref 135–145)

## 2017-03-12 LAB — COOXEMETRY PANEL
Carboxyhemoglobin: 2 % — ABNORMAL HIGH (ref 0.5–1.5)
Methemoglobin: 0.9 % (ref 0.0–1.5)
O2 SAT: 55.3 %
TOTAL HEMOGLOBIN: 9.7 g/dL — AB (ref 12.0–16.0)

## 2017-03-12 LAB — HEPARIN LEVEL (UNFRACTIONATED): Heparin Unfractionated: 0.43 IU/mL (ref 0.30–0.70)

## 2017-03-12 MED ORDER — POTASSIUM CHLORIDE 10 MEQ/100ML IV SOLN
10.0000 meq | INTRAVENOUS | Status: AC
  Administered 2017-03-12 (×4): 10 meq via INTRAVENOUS
  Filled 2017-03-12 (×4): qty 100

## 2017-03-12 MED ORDER — POTASSIUM CHLORIDE CRYS ER 20 MEQ PO TBCR
40.0000 meq | EXTENDED_RELEASE_TABLET | Freq: Two times a day (BID) | ORAL | Status: DC
  Administered 2017-03-12 – 2017-03-13 (×3): 40 meq via ORAL
  Filled 2017-03-12 (×3): qty 2

## 2017-03-12 MED ORDER — POTASSIUM CHLORIDE 2 MEQ/ML IV SOLN
40.0000 meq | Freq: Once | INTRAVENOUS | Status: DC
  Filled 2017-03-12: qty 20

## 2017-03-12 MED ORDER — POTASSIUM CHLORIDE CRYS ER 20 MEQ PO TBCR
40.0000 meq | EXTENDED_RELEASE_TABLET | Freq: Every day | ORAL | Status: DC
  Administered 2017-03-12: 40 meq via ORAL
  Filled 2017-03-12: qty 2

## 2017-03-12 NOTE — Progress Notes (Signed)
[place] for IV heparin (Eliquis stopped) Indication: atrial fibrillation  Allergies  Allergen Reactions  . Plavix [Clopidogrel Bisulfate] Hives    Patient Measurements: Height: 5\' 5"  (165.1 cm) Weight: 153 lb 14.1 oz (69.8 kg) IBW/kg (Calculated) : 61.5 Heparin Dosing Weight: 73.5 kg  Vital Signs: Temp: 97.6 F (36.4 C) (11/18 0804) Temp Source: Oral (11/18 0804) BP: 100/66 (11/18 0804) Pulse Rate: 64 (11/18 0804)  Labs: Recent Labs    03/10/17 0552 03/10/17 1406 03/10/17 2233 03/11/17 0618 03/11/17 0933 03/12/17 0540  HGB  --   --   --  11.8*  --  10.8*  HCT  --   --   --  37.3*  --  34.5*  PLT  --   --   --  313  --  279  APTT  --  32 55*  --  70*  --   HEPARINUNFRC  --  0.26*  --   --  0.59 0.43  CREATININE 1.82*  --   --  1.44*  --  1.42*    Estimated Creatinine Clearance: 52.9 mL/min (A) (by C-G formula based on SCr of 1.42 mg/dL (H)).   Medical History: Past Medical History:  Diagnosis Date  . AICD (automatic cardioverter/defibrillator) present   . CAD (coronary artery disease) 2009   AMI in 12/2007 with PCI to LAD, staged PCI to  mid/distal RCA, NSTEMI in 02/2009 with BMS to LCx  . CHF (congestive heart failure) ([place])   . Chronic kidney disease 11/03/2016   stage 3 kidney disease  . HLD (hyperlipidemia)   . HTN (hypertension)   . Ischemic cardiomyopathy    Admitted in 07/2010 with CHF exacerbation  . Myocardial infarction ([place])   . Seizures ([place]) 11/03/2016   one seizure in 04/2016 during cardiac event    Medications:  Infusions:  . sodium chloride    . sodium chloride    . DOBUTamine 5 mcg/kg/min (03/12/17 0400)  . heparin 1,200 Units/hr (03/12/17 0400)  . potassium chloride 10 mEq (03/12/17 1050)    Assessment: 51 yo male admitted after cath lab for milrinone initiation. On Eliquis PTA, currently stopped.   Heparin level at goal this morning. CBC within normal limits. No issues noted.  Goal of  Therapy:  Heparin level 0.3-0.7 units/ml Monitor platelets by anticoagulation protocol: Yes   Plan:  Continue heparin at 1200 units/hr Daily Heparin level and cbc  Erin Hearing PharmD., BCPS Clinical Pharmacist Pager [phone] 03/12/2017 11:12 AM

## 2017-03-12 NOTE — Progress Notes (Signed)
Advanced Heart Failure Rounding Note   Subjective:    PICC line placed. Was off dobutamine yesterday am and restarted due to co-ox in 30s.   Feeling better. Co-ox now 55% on dobutamine 5. Diuresed 2 pounds. Breathing better. More energy.  CVP 9. Getting K runs.     Objective:   Weight Range:  Vital Signs:   Temp:  [97.3 F (36.3 C)-97.8 F (36.6 C)] 97.6 F (36.4 C) (11/18 0804) Pulse Rate:  [53-69] 64 (11/18 0804) Resp:  [17-24] 20 (11/18 0804) BP: (82-100)/(45-66) 100/66 (11/18 0804) SpO2:  [93 %-97 %] 94 % (11/18 0804) Weight:  [69.8 kg (153 lb 14.1 oz)] 69.8 kg (153 lb 14.1 oz) (11/18 0536) Last BM Date: 03/11/17  Weight change: Filed Weights   03/10/17 0535 03/11/17 0616 03/12/17 0536  Weight: 73.5 kg (162 lb) 70.5 kg (155 lb 6.8 oz) 69.8 kg (153 lb 14.1 oz)    Intake/Output:   Intake/Output Summary (Last 24 hours) at 03/12/2017 1003 Last data filed at 03/12/2017 0915 Gross per 24 hour  Intake 777.45 ml  Output 1550 ml  Net -772.55 ml     Physical Exam: General:  Lying in bed No resp difficulty HEENT: normal Neck: supple. JVP 8-9  Carotids 2+ bilat; no bruits. No lymphadenopathy or thryomegaly appreciated. Cor: PMI laterally displaced. Regular rate & rhythm. +s3 Lungs: clear Abdomen: soft, nontender, nondistended. No hepatosplenomegaly. No bruits or masses. Good bowel sounds. Extremities: no cyanosis, clubbing, rash, edema Neuro: alert & orientedx3, cranial nerves grossly intact. moves all 4 extremities w/o difficulty. Affect pleasant   Telemetry: NSR with v pacing in 60-70s. Personally reviewed   Labs: Basic Metabolic Panel: Recent Labs  Lab 03/08/17 1546 03/10/17 0552 03/11/17 0618 03/12/17 0540  NA 139 139 138 138  K 4.0 3.0* 3.6 2.9*  CL 105 103 103 104  CO2 26 26 27 28   GLUCOSE 104* 84 101* 91  BUN 17 17 13 11   CREATININE 1.72* 1.82* 1.44* 1.42*  CALCIUM 9.1 8.9 8.6* 8.2*    Liver Function Tests: No results for input(s): AST,  ALT, ALKPHOS, BILITOT, PROT, ALBUMIN in the last 168 hours. No results for input(s): LIPASE, AMYLASE in the last 168 hours. No results for input(s): AMMONIA in the last 168 hours.  CBC: Recent Labs  Lab 03/08/17 1546 03/11/17 0618 03/12/17 0540  WBC 11.7* 9.6 9.8  HGB 12.6* 11.8* 10.8*  HCT 39.7 37.3* 34.5*  MCV 78.3 78.2 77.7*  PLT 332 313 279    Cardiac Enzymes: No results for input(s): CKTOTAL, CKMB, CKMBINDEX, TROPONINI in the last 168 hours.  BNP: BNP (last 3 results) Recent Labs    05/03/16 0435 05/19/16 1554 07/27/16 1930  BNP 1,034.8* 470.0* 321.6*    ProBNP (last 3 results) No results for input(s): PROBNP in the last 8760 hours.    Other results:  Imaging: Dg Chest Port 1 View  Result Date: 03/10/2017 CLINICAL DATA:  Bedside PICC placement.  Shortness of breath. EXAM: PORTABLE CHEST 1 VIEW COMPARISON:  02/21/2017, 02/01/2017 and earlier. FINDINGS: Right arm PICC tip projects at or near the cavoatrial junction. Cardiac silhouette markedly enlarged. Left subclavian transvenous pacemaker unchanged and appears intact. Mild perihilar airspace pulmonary edema, asymmetric and increased in the right lung, new since the most recent prior examination. No visible pleural effusions. IMPRESSION: 1. Right arm PICC tip projects at or near the cavoatrial junction. 2. Marked cardiomegaly. Mild perihilar airspace pulmonary edema, asymmetric and increased on the right, indicating mild acute CHF.  Electronically Signed   By: Evangeline Dakin M.D.   On: 03/10/2017 16:58     Medications:     Scheduled Medications: . amiodarone  200 mg Oral BID  . aspirin  81 mg Oral Daily  . atorvastatin  40 mg Oral q1800  . furosemide  80 mg Intravenous BID  . Influenza vac split quadrivalent PF  0.5 mL Intramuscular Tomorrow-1000  . ivabradine  2.5 mg Oral BID WC  . losartan  12.5 mg Oral Daily  . multivitamin with minerals  1 tablet Oral Daily  . potassium chloride SA  40 mEq Oral Daily    . sodium chloride flush  10-40 mL Intracatheter Q12H  . sodium chloride flush  3 mL Intravenous Q12H  . sodium chloride flush  3 mL Intravenous Q12H    Infusions: . sodium chloride    . sodium chloride    . DOBUTamine 5 mcg/kg/min (03/12/17 0400)  . heparin 1,200 Units/hr (03/12/17 0400)  . 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 Started on milrinone but switched to dobutamine due to hypotension.   Plan/Discussion:     1. Acute/Chronic systolic HFwith low output - Echo 07/29/2016 LVEF 25%, Mild AI, Mild MR, Mod LAE. - Echo performed at bedside on 11/14 15-20%. Repeat ECHO.  - RHC with volume overload and low output heart failure.  - Failed milrinone due to hypotension. Now on dobutamine 5. Co-ox improving but still marginal. Will continue inotropic support. - We again discussed advanced therapies today  including VAD and he remains very reluctant.  - CVP 9. Continue IV lasix.  - Has been off ACE/ARB/ARNI with CKD and low BP. Start losartan 12.5mg  daily.  - No spiro or digoxin with CKD.  -Continueivabradine 2.5mg  daily.  -Holding bb with low output.  - Could not tolerate Bidil, caused hypotension. 2. CAD s/p PCI to mid RCA and PLOM with DES x 2 and DES to mid LAD in 1/18.  - Continue ASA and statin  - has been off Brilinta with epicardial lead and possible need for mechanical support 3. PAF - Maintaining NSR. Continue Amiodarone. - Continue eliquis 5 mg BID. - hemoglobin stable 4. Acute on CKD stage III -renal function improved with inotropic support 5. Tobacco abuse. -Stopped smoking in January. 6. LBBB -Now s/p CRT-D upgrade in July 2018 with epicardial pacing. Will  discuss reprogramming with EP on Monday. 7. Frequent PVCs -Continue ranexa and amio.  8. Hypokalemia.  -Will supp. D/w PharmD  Length of Stay: 2   Glori Bickers MD 03/12/2017, 10:03 AM  Advanced Heart Failure Team Pager [phone] (M-F; [place])  Please contact [facility] for night-coverage after hours (4p -7a ) and weekends on [email or web address]

## 2017-03-13 ENCOUNTER — Encounter (HOSPITAL_COMMUNITY): Payer: Self-pay | Admitting: *Deleted

## 2017-03-13 ENCOUNTER — Inpatient Hospital Stay (HOSPITAL_COMMUNITY): Payer: Medicare HMO

## 2017-03-13 DIAGNOSIS — R570 Cardiogenic shock: Secondary | ICD-10-CM

## 2017-03-13 LAB — PROTIME-INR
INR: 1.11
Prothrombin Time: 14.3 seconds (ref 11.4–15.2)

## 2017-03-13 LAB — HEPARIN LEVEL (UNFRACTIONATED): HEPARIN UNFRACTIONATED: 0.48 [IU]/mL (ref 0.30–0.70)

## 2017-03-13 LAB — URINALYSIS, ROUTINE W REFLEX MICROSCOPIC
Bilirubin Urine: NEGATIVE
GLUCOSE, UA: NEGATIVE mg/dL
Hgb urine dipstick: NEGATIVE
Ketones, ur: NEGATIVE mg/dL
LEUKOCYTES UA: NEGATIVE
Nitrite: NEGATIVE
PH: 8 (ref 5.0–8.0)
Protein, ur: NEGATIVE mg/dL
SPECIFIC GRAVITY, URINE: 1.005 (ref 1.005–1.030)

## 2017-03-13 LAB — CBC
HEMATOCRIT: 36 % — AB (ref 39.0–52.0)
HEMOGLOBIN: 11.1 g/dL — AB (ref 13.0–17.0)
MCH: 24 pg — ABNORMAL LOW (ref 26.0–34.0)
MCHC: 30.8 g/dL (ref 30.0–36.0)
MCV: 77.9 fL — ABNORMAL LOW (ref 78.0–100.0)
Platelets: 287 10*3/uL (ref 150–400)
RBC: 4.62 MIL/uL (ref 4.22–5.81)
RDW: 16.6 % — ABNORMAL HIGH (ref 11.5–15.5)
WBC: 10.5 10*3/uL (ref 4.0–10.5)

## 2017-03-13 LAB — TYPE AND SCREEN
ABO/RH(D): O POS
ANTIBODY SCREEN: NEGATIVE

## 2017-03-13 LAB — BASIC METABOLIC PANEL
Anion gap: 7 (ref 5–15)
BUN: 9 mg/dL (ref 6–20)
CHLORIDE: 105 mmol/L (ref 101–111)
CO2: 26 mmol/L (ref 22–32)
Calcium: 8.7 mg/dL — ABNORMAL LOW (ref 8.9–10.3)
Creatinine, Ser: 1.41 mg/dL — ABNORMAL HIGH (ref 0.61–1.24)
GFR calc non Af Amer: 56 mL/min — ABNORMAL LOW (ref >60)
Glucose, Bld: 87 mg/dL (ref 65–99)
POTASSIUM: 3.9 mmol/L (ref 3.5–5.1)
SODIUM: 138 mmol/L (ref 135–145)

## 2017-03-13 LAB — COOXEMETRY PANEL
CARBOXYHEMOGLOBIN: 1.9 % — AB (ref 0.5–1.5)
METHEMOGLOBIN: 0.7 % (ref 0.0–1.5)
O2 Saturation: 54.8 %
Total hemoglobin: 11.5 g/dL — ABNORMAL LOW (ref 12.0–16.0)

## 2017-03-13 LAB — PREALBUMIN: Prealbumin: 25.6 mg/dL (ref 18–38)

## 2017-03-13 LAB — TSH: TSH: 2.753 u[IU]/mL (ref 0.350–4.500)

## 2017-03-13 LAB — HEMOGLOBIN A1C
Hgb A1c MFr Bld: 6.1 % — ABNORMAL HIGH (ref 4.8–5.6)
MEAN PLASMA GLUCOSE: 128.37 mg/dL

## 2017-03-13 LAB — ANTITHROMBIN III: ANTITHROMB III FUNC: 100 % (ref 75–120)

## 2017-03-13 LAB — LACTATE DEHYDROGENASE: LDH: 205 U/L — ABNORMAL HIGH (ref 98–192)

## 2017-03-13 LAB — T4, FREE: FREE T4: 1.43 ng/dL — AB (ref 0.61–1.12)

## 2017-03-13 LAB — URIC ACID: Uric Acid, Serum: 7.7 mg/dL — ABNORMAL HIGH (ref 4.4–7.6)

## 2017-03-13 LAB — MAGNESIUM: MAGNESIUM: 1.8 mg/dL (ref 1.7–2.4)

## 2017-03-13 LAB — APTT: APTT: 80 s — AB (ref 24–36)

## 2017-03-13 LAB — PSA: Prostatic Specific Antigen: 5.49 ng/mL — ABNORMAL HIGH (ref 0.00–4.00)

## 2017-03-13 MED ORDER — MAGNESIUM SULFATE 2 GM/50ML IV SOLN
2.0000 g | Freq: Once | INTRAVENOUS | Status: AC
  Administered 2017-03-13: 2 g via INTRAVENOUS
  Filled 2017-03-13: qty 50

## 2017-03-13 MED ORDER — APIXABAN 5 MG PO TABS: 5.0000 mg | ORAL_TABLET | Freq: Two times a day (BID) | ORAL | Status: DC

## 2017-03-13 MED ORDER — HEPARIN (PORCINE) IN NACL 100-0.45 UNIT/ML-% IJ SOLN: 1200.0000 [IU]/h | INTRAMUSCULAR | Status: DC

## 2017-03-13 NOTE — Progress Notes (Signed)
Transplant packet faxed to [facility].   [name] RN, VAD Coordinator 24/7 pager [phone]

## 2017-03-13 NOTE — Progress Notes (Signed)
Received a call  from IR stating that procedure is cancelled today and to let pt. Eat. Diet ordered.

## 2017-03-13 NOTE — Progress Notes (Signed)
Chief Complaint: Patient was seen in consultation today for placement of tunneled central venous catheter at the request of Dr. Pierre Bali  Referring Physician(s): Dr. Haroldine Laws  Supervising Physician: Sandi Mariscal  Patient Status: [facility] - In-pt  History of Present Illness: David [name] is a 51 y.o. male with significant CHF with low output. He needs continuous dobutamine drip. Currently has (R)UE PICC line but will be discharged soon and IR is requested to place tunneled PICC for more durable long term access. Chart, PMHx, meds, labs reviewed. Pt states he will require 'something for my nerves', therefore sedation requested. Pt has eaten breakfast. Takes daily Eliquis due to PAF and CAD with drug eluting stents earlier this year.  Past Medical History:  Diagnosis Date  . AICD (automatic cardioverter/defibrillator) present   . CAD (coronary artery disease) 2009   AMI in 12/2007 with PCI to LAD, staged PCI to  mid/distal RCA, NSTEMI in 02/2009 with BMS to LCx  . CHF (congestive heart failure) ([place])   . Chronic kidney disease 11/03/2016   stage 3 kidney disease  . HLD (hyperlipidemia)   . HTN (hypertension)   . Ischemic cardiomyopathy    Admitted in 07/2010 with CHF exacerbation  . Myocardial infarction ([place])   . Seizures ([place]) 11/03/2016   one seizure in 04/2016 during cardiac event    Past Surgical History:  Procedure Laterality Date  . BiV ICD Insertion CRT-D N/A 08/01/2016   Performed by Constance Haw, MD at [place] [facility]  . Coronary Stent Intervention N/A 05/09/2016   Performed by Martinique, Peter M, MD at [place] [facility]  . Electrophysiology Study N/A 06/24/2016   Performed by Deboraha Sprang, MD at [place] [facility]  . Intravascular Pressure Wire/FFR Study N/A 05/09/2016   Performed by [place], Peter M, MD at [place] [facility]  . LEFT HEART CATHETERIZATION WITH CORONARY ANGIOGRAM N/A 03/13/2011   Performed by Lorretta Harp, MD at [facility] CATH LAB  . LV  EPICARDIAL PACING LEAD PLACEMENT VIA LEFT MINI THORACOTOMY Left 11/07/2016   Performed by Gaye Pollack, MD at [facility] OR  . PERCUTANEOUS CORONARY STENT INTERVENTION (PCI-S)  03/13/2011   Performed by Lorretta Harp, MD at [facility] CATH LAB  . POSSIBLE  ICD Implant N/A 06/24/2016   Performed by Deboraha Sprang, MD at [place] [facility]  . RIGHT/LEFT HEART CATH AND CORONARY ANGIOGRAPHY N/A 03/10/2017   Performed by Jolaine Artist, MD at [place] [facility]  . Right/Left Heart Cath and Coronary Angiography N/A 05/05/2016   Performed by Jolaine Artist, MD at [facility] INVASIVE CV LAB    Allergies: Plavix [clopidogrel bisulfate]  Medications:  Current Facility-Administered Medications:  .  0.9 %  sodium chloride infusion, 250 mL, Intravenous, PRN, Bensimhon, Daniel R, MD .  0.9 %  sodium chloride infusion, 250 mL, Intravenous, PRN, Bensimhon, Shaune Pascal, MD .  acetaminophen (TYLENOL) tablet 650 mg, 650 mg, Oral, Q4H PRN, Bensimhon, Shaune Pascal, MD .  ALPRAZolam Duanne Moron) tablet 0.25 mg, 0.25 mg, Oral, BID PRN, Clegg, Amy D, NP, 0.25 mg at 03/10/17 1705 .  amiodarone (PACERONE) tablet 200 mg, 200 mg, Oral, BID, Bensimhon, Shaune Pascal, MD, 200 mg at 03/13/17 0943 .  apixaban (ELIQUIS) tablet 5 mg, 5 mg, Oral, BID, Bensimhon, Shaune Pascal, MD .  aspirin chewable tablet 81 mg, 81 mg, Oral, Daily, Bensimhon, Shaune Pascal, MD, 81 mg at 03/13/17 0943 .  atorvastatin (LIPITOR) tablet 40 mg, 40  mg, Oral, q1800, Bensimhon, Shaune Pascal, MD, 40 mg at 03/12/17 1702 .  DOBUTamine (DOBUTREX) infusion 4000 mcg/mL, 5 mcg/kg/min, Intravenous, Titrated, Bensimhon, Shaune Pascal, MD, Last Rate: 5.3 mL/hr at 03/12/17 2000, 5 mcg/kg/min at 03/12/17 2000 .  furosemide (LASIX) injection 80 mg, 80 mg, Intravenous, BID, Bensimhon, Shaune Pascal, MD, 80 mg at 03/13/17 0847 .  heparin ADULT infusion 100 units/mL (25000 units/23mL sodium chloride 0.45%), 1,200 Units/hr, Intravenous, Continuous, Bensimhon, Shaune Pascal, MD, Last Rate: 12 mL/hr at 03/12/17 2000,  1,200 Units/hr at 03/12/17 2000 .  Influenza vac split quadrivalent PF (FLUARIX) injection 0.5 mL, 0.5 mL, Intramuscular, Tomorrow-1000, Bensimhon, Shaune Pascal, MD .  ivabradine [facility]) tablet 2.5 mg, 2.5 mg, Oral, BID WC, Bensimhon, Shaune Pascal, MD, 2.5 mg at 03/13/17 0846 .  losartan (COZAAR) tablet 12.5 mg, 12.5 mg, Oral, Daily, Bensimhon, Shaune Pascal, MD, 12.5 mg at 03/13/17 0943 .  multivitamin with minerals tablet 1 tablet, 1 tablet, Oral, Daily, Bensimhon, Shaune Pascal, MD, 1 tablet at 03/13/17 [phone] .  ondansetron (ZOFRAN) injection 4 mg, 4 mg, Intravenous, Q6H PRN, Bensimhon, Daniel R, MD .  potassium chloride SA (K-DUR,KLOR-CON) CR tablet 40 mEq, 40 mEq, Oral, BID, Bensimhon, Shaune Pascal, MD, 40 mEq at 03/13/17 0846 .  sodium chloride flush (NS) 0.9 % injection 10-40 mL, 10-40 mL, Intracatheter, PRN, Bensimhon, Shaune Pascal, MD, 10 mL at 03/12/17 1059 .  sodium chloride flush (NS) 0.9 % injection 10-40 mL, 10-40 mL, Intracatheter, Q12H, Bensimhon, Shaune Pascal, MD, 10 mL at 03/12/17 2143 .  sodium chloride flush (NS) 0.9 % injection 3 mL, 3 mL, Intravenous, Q12H, Bensimhon, Shaune Pascal, MD, 3 mL at 03/12/17 2144 .  sodium chloride flush (NS) 0.9 % injection 3 mL, 3 mL, Intravenous, PRN, Bensimhon, Shaune Pascal, MD .  sodium chloride flush (NS) 0.9 % injection 3 mL, 3 mL, Intravenous, Q12H, Bensimhon, Shaune Pascal, MD, 3 mL at 03/12/17 2144 .  sodium chloride flush (NS) 0.9 % injection 3 mL, 3 mL, Intravenous, PRN, Bensimhon, Shaune Pascal, MD    Family History  Problem Relation Age of Onset  . Coronary artery disease Father   . Hypertension Father   . Hypertension Sister   . Coronary artery disease Brother   . Emphysema Brother   . Coronary artery disease Mother   . Hypertension Mother   . Emphysema Mother   . Hypertension Daughter   . Obesity Daughter   . Diabetes Unknown   . Hypertension Unknown   . Coronary artery disease Unknown     Social History   Socioeconomic History  . Marital status: Married     Spouse name: [name]  . Number of children: 3  . Years of education: None  . Highest education level: None  Social Needs  . Financial resource strain: None  . Food insecurity - worry: None  . Food insecurity - inability: None  . Transportation needs - medical: None  . Transportation needs - non-medical: None  Occupational History  . None  Tobacco Use  . Smoking status: Former Smoker    Packs/day: 0.20    Years: 30.00    Pack years: 6.00    Last attempt to quit: 04/29/2016    Years since quitting: 0.8  . Smokeless tobacco: Never Used  . Tobacco comment: Used to smoke cigarettes. Pt is currently smoking 3 black & mild cigars per day. He has cut down but never quit.  Substance and Sexual Activity  . Alcohol use: No  . Drug use: No  .  Sexual activity: None  Other Topics Concern  . None  Social History Narrative  . None    Review of Systems: A 12 point ROS discussed and pertinent positives are indicated in the HPI above.  All other systems are negative.  Review of Systems  Vital Signs: BP (!) 100/53   Pulse (!) 58   Temp (!) 97.5 F (36.4 C) (Oral)   Resp (!) 21   Ht 5\' 5"  (1.651 m)   Wt 152 lb 8.9 oz (69.2 kg)   SpO2 (!) 87%   BMI 25.39 kg/m   Physical Exam  Constitutional: He is oriented to person, place, and time. He appears well-developed. No distress.  HENT:  Head: Normocephalic.  Mouth/Throat: Oropharynx is clear and moist.  Neck: Normal range of motion. No JVD present. No tracheal deviation present.  Cardiovascular: Normal rate, regular rhythm and normal heart sounds.  Pulmonary/Chest: Effort normal. No respiratory distress.  Neurological: He is alert and oriented to person, place, and time.  Skin: Skin is warm and dry.  Psychiatric: He has a normal mood and affect.    Imaging: Dg Chest Port 1 View  Result Date: 03/10/2017 CLINICAL DATA:  Bedside PICC placement.  Shortness of breath. EXAM: PORTABLE CHEST 1 VIEW COMPARISON:  02/21/2017, 02/01/2017 and  earlier. FINDINGS: Right arm PICC tip projects at or near the cavoatrial junction. Cardiac silhouette markedly enlarged. Left subclavian transvenous pacemaker unchanged and appears intact. Mild perihilar airspace pulmonary edema, asymmetric and increased in the right lung, new since the most recent prior examination. No visible pleural effusions. IMPRESSION: 1. Right arm PICC tip projects at or near the cavoatrial junction. 2. Marked cardiomegaly. Mild perihilar airspace pulmonary edema, asymmetric and increased on the right, indicating mild acute CHF. Electronically Signed   By: Evangeline Dakin M.D.   On: 03/10/2017 16:58    Labs:  CBC: Recent Labs    03/08/17 1546 03/11/17 0618 03/12/17 0540 03/13/17 0427  WBC 11.7* 9.6 9.8 10.5  HGB 12.6* 11.8* 10.8* 11.1*  HCT 39.7 37.3* 34.5* 36.0*  PLT 332 313 279 287    COAGS: Recent Labs    04/30/16 1950 05/09/16 0500 11/03/16 1141 03/08/17 1546 03/10/17 1406 03/10/17 2233 03/11/17 0933  INR 1.44 1.09 1.05 1.55  --   --   --   APTT  --   --  27  --  32 55* 70*    BMP: Recent Labs    03/10/17 0552 03/11/17 0618 03/12/17 0540 03/13/17 0427  NA 139 138 138 138  K 3.0* 3.6 2.9* 3.9  CL 103 103 104 105  CO2 26 27 28 26   GLUCOSE 84 101* 91 87  BUN 17 13 11 9   CALCIUM 8.9 8.6* 8.2* 8.7*  CREATININE 1.82* 1.44* 1.42* 1.41*  GFRNONAA 41* 54* 55* 56*  GFRAA 48* >60 >60 >60    LIVER FUNCTION TESTS: Recent Labs    04/30/16 1950 11/03/16 1141  BILITOT 1.0 0.7  AST 94* 19  ALT 149* 20  ALKPHOS 132* 83  PROT 6.4* 7.4  ALBUMIN 2.9* 3.4*    TUMOR MARKERS: No results for input(s): AFPTM, CEA, CA199, CHROMGRNA in the last 8760 hours.  Assessment and Plan: CHF with low output requiring chronic dobutamine. Needs durable/long term central venous access. Plan for tunneled CVC tomorrow 11/20 Risks and benefits discussed with the patient including, but not limited to bleeding, infection, pneumothorax, or fibrin sheath  development and need for additional procedures. Mildly elevated risk of bleeding due to Eliquis  use. All of the patient's questions were answered, patient is agreeable to proceed. Consent signed and in chart.    Thank you for this interesting consult.  I greatly enjoyed meeting Baptiste Littler and look forward to participating in their care.  A copy of this report was sent to the requesting provider on this date.  Electronically Signed: Ascencion Dike, PA-C 03/13/2017, 10:45 AM   I spent a total of 20 minutes  in face to face in clinical consultation, greater than 50% of which was counseling/coordinating care for central venous catheter placement.

## 2017-03-13 NOTE — Progress Notes (Addendum)
[place] for IV heparin (Eliquis stopped) Indication: atrial fibrillation  Allergies  Allergen Reactions  . Plavix [Clopidogrel Bisulfate] Hives    Patient Measurements: Height: 5\' 5"  (165.1 cm) Weight: 152 lb 8.9 oz (69.2 kg) IBW/kg (Calculated) : 61.5 Heparin Dosing Weight: 73.5 kg  Vital Signs: Temp: 97.5 F (36.4 C) (11/19 0700) Temp Source: Oral (11/19 0700) BP: 100/53 (11/19 0500) Pulse Rate: 58 (11/19 0500)  Labs: Recent Labs    03/10/17 1406 03/10/17 2233  03/11/17 0618 03/11/17 0933 03/12/17 0540 03/13/17 0427  HGB  --   --    < > 11.8*  --  10.8* 11.1*  HCT  --   --   --  37.3*  --  34.5* 36.0*  PLT  --   --   --  313  --  279 287  APTT 32 55*  --   --  70*  --   --   HEPARINUNFRC 0.26*  --   --   --  0.59 0.43 0.48  CREATININE  --   --   --  1.44*  --  1.42* 1.41*   < > = values in this interval not displayed.    Estimated Creatinine Clearance: 53.3 mL/min (A) (by C-G formula based on SCr of 1.41 mg/dL (H)).   Medical History: Past Medical History:  Diagnosis Date  . AICD (automatic cardioverter/defibrillator) present   . CAD (coronary artery disease) 2009   AMI in 12/2007 with PCI to LAD, staged PCI to  mid/distal RCA, NSTEMI in 02/2009 with BMS to LCx  . CHF (congestive heart failure) ([place])   . Chronic kidney disease 11/03/2016   stage 3 kidney disease  . HLD (hyperlipidemia)   . HTN (hypertension)   . Ischemic cardiomyopathy    Admitted in 07/2010 with CHF exacerbation  . Myocardial infarction ([place])   . Seizures ([place]) 11/03/2016   one seizure in 04/2016 during cardiac event    Medications:  Infusions:  . sodium chloride    . sodium chloride    . DOBUTamine 5 mcg/kg/min (03/12/17 2000)  . heparin 1,200 Units/hr (03/12/17 2000)  . magnesium sulfate 1 - 4 g bolus IVPB      Assessment: 51 yo male admitted after cath lab for milrinone initiation. On Eliquis PTA, currently stopped.   Heparin level  at goal this morning. CBC within normal limits. No issues noted. Planned PICC placement today to continue dobutmaine treatment at home, placement has been postponed to 11/20.  Goal of Therapy:  Heparin level 0.3-0.7 units/ml Monitor platelets by anticoagulation protocol: Yes   Plan:  Continue heparin at 1200 units/hr Daily HL and CBC Hold Eliquis until after PICC placement  Leroy Libman, PharmD Pharmacy Resident Pager: [phone]

## 2017-03-13 NOTE — Progress Notes (Signed)
Initial Encounter with LVAD Team and MCS Introduction:  David [name] is a 51 y.o. male whom  has a past medical history of AICD (automatic cardioverter/defibrillator) present, CAD (coronary artery disease) (2009), CHF (congestive heart failure) ([place]), Chronic kidney disease (11/03/2016), HLD (hyperlipidemia), HTN (hypertension), Ischemic cardiomyopathy, Myocardial infarction ([place]), and Seizures ([place]) (11/03/2016).. We have been asked to evaluate the patient for advanced therapies which include Left Ventricular Assist Device implantation.   Lab Results  Component Value Date   ABORH O POS 03/13/2017    Lab Results  Component Value Date   HGBA1C 6.1 (H) 03/13/2017   Lab Results  Component Value Date   CREATININE 1.41 (H) 03/13/2017   CREATININE 1.42 (H) 03/12/2017   CREATININE 1.44 (H) 03/11/2017    VAD educational packet including "HM III Patient Handbook", "HM II Left Ventricular Assist System" packet, and "[place] HM III Patient Education" reviewed in detail with me and left at bedside for continued reference. Patient education DVD was given to him as well for reference should he want to see more about the equipment at home after reviewing the information I left him.   Explained that LVAD can be implanted for two indications in the setting of advanced left ventricular heart failure treatment:  Bridge to transplant - used for patients who cannot safely wait for heart transplant without this device.  Or   Destination therapy - used for patients until end of life or recovery of heart function.  Discussed that at this point [name] would be considered for destination Ther[employer] should he be deemed an acceptable VAD candidate.   Provided brief equipment overview of the HeartMate III pump and discussed placement, surgical procedure, peripheral equipment, life-long coumadin therapy, importance of medication adherence and clinic follow up for as long as patient is living on  support, life-style modifications, as well as need for caregiver to be successful with this therapy.    We discussed the process of the evaluation period and how a decision was made by the [facility] team whether he would be an appropriate candidate for therapy or not. Evaluation consent was reviewed and signed to begin evaluation process.  Caregiver Support: wife, 3 children  Home Inspection Checklist: verified that patient has reliable telephone, running water and electricity in the home.   Advised the patient review the materials, contact either myself or Zada Girt with questions and we will plan on meeting with him at next scheduled clinic appointment.   [name] RN, VAD Coordinator 24/7 pager [phone]

## 2017-03-13 NOTE — Progress Notes (Signed)
Advanced Heart Failure Rounding Note   Subjective:    PICC line placed. Was off dobutamine 03/12/17 am and restarted due to co-ox in 30s.   Coox 54.8% this am on dobutamine 5 mcg/kg/min.   Feeling better this am. Denies SOB. Denies lightheadedness or dizziness. Still very reluctant to discuss advanced therapies. Says he just needs "time to process". Willing to accept home milrinone.   CVP 9-10 cm.  Echo 03/11/17 with LVEF 20-25%, Mild MR, Mild/Mod MR, Severe LAE, Mildly reduced RV, PA peak pressure 56 mm Hg.   Objective:   Weight Range:  Vital Signs:   Temp:  [97.4 F (36.3 C)-98.1 F (36.7 C)] 97.5 F (36.4 C) (11/19 0700) Pulse Rate:  [56-74] 58 (11/19 0500) Resp:  [14-28] 21 (11/19 0500) BP: (90-113)/(53-85) 100/53 (11/19 0500) SpO2:  [87 %-100 %] 87 % (11/19 0500) Weight:  [152 lb 8.9 oz (69.2 kg)] 152 lb 8.9 oz (69.2 kg) (11/19 0321) Last BM Date: 03/10/17  Weight change: Filed Weights   03/11/17 0616 03/12/17 0536 03/13/17 0321  Weight: 155 lb 6.8 oz (70.5 kg) 153 lb 14.1 oz (69.8 kg) 152 lb 8.9 oz (69.2 kg)    Intake/Output:   Intake/Output Summary (Last 24 hours) at 03/13/2017 0757 Last data filed at 03/13/2017 0600 Gross per 24 hour  Intake 1373.8 ml  Output 2955 ml  Net -1581.2 ml     Physical Exam: General: Lying in bed. NAD.  HEENT: Normal Neck: Supple. JVP 9-10 cm. Carotids 2+ bilat; no bruits. No thyromegaly or nodule noted. Cor: PMI laterally displaced. RRR, +S3. Lungs: CTAB, normal effort. Abdomen: Soft, non-tender, non-distended, no HSM. No bruits or masses. +BS  Extremities: No cyanosis, clubbing, or rash. R and LLE no edema.  Neuro: Alert & orientedx3, cranial nerves grossly intact. moves all 4 extremities w/o difficulty. Affect pleasant   Telemetry: NSR with V pacing 60-70s, Personally reviewed.   Labs: Basic Metabolic Panel: Recent Labs  Lab 03/08/17 1546 03/10/17 0552 03/11/17 0618 03/12/17 0540 03/13/17 0427  NA 139 139  138 138 138  K 4.0 3.0* 3.6 2.9* 3.9  CL 105 103 103 104 105  CO2 26 26 27 28 26   GLUCOSE 104* 84 101* 91 87  BUN 17 17 13 11 9   CREATININE 1.72* 1.82* 1.44* 1.42* 1.41*  CALCIUM 9.1 8.9 8.6* 8.2* 8.7*  MG  --   --   --   --  1.8   Liver Function Tests: No results for input(s): AST, ALT, ALKPHOS, BILITOT, PROT, ALBUMIN in the last 168 hours. No results for input(s): LIPASE, AMYLASE in the last 168 hours. No results for input(s): AMMONIA in the last 168 hours.  CBC: Recent Labs  Lab 03/08/17 1546 03/11/17 0618 03/12/17 0540 03/13/17 0427  WBC 11.7* 9.6 9.8 10.5  HGB 12.6* 11.8* 10.8* 11.1*  HCT 39.7 37.3* 34.5* 36.0*  MCV 78.3 78.2 77.7* 77.9*  PLT 332 313 279 287   Cardiac Enzymes: No results for input(s): CKTOTAL, CKMB, CKMBINDEX, TROPONINI in the last 168 hours.  BNP: BNP (last 3 results) Recent Labs    05/03/16 0435 05/19/16 1554 07/27/16 1930  BNP 1,034.8* 470.0* 321.6*   ProBNP (last 3 results) No results for input(s): PROBNP in the last 8760 hours.  Other results:  Imaging: No results found.  Medications:     Scheduled Medications: . amiodarone  200 mg Oral BID  . aspirin  81 mg Oral Daily  . atorvastatin  40 mg Oral q1800  .  furosemide  80 mg Intravenous BID  . Influenza vac split quadrivalent PF  0.5 mL Intramuscular Tomorrow-1000  . ivabradine  2.5 mg Oral BID WC  . losartan  12.5 mg Oral Daily  . multivitamin with minerals  1 tablet Oral Daily  . potassium chloride SA  40 mEq Oral BID  . sodium chloride flush  10-40 mL Intracatheter Q12H  . sodium chloride flush  3 mL Intravenous Q12H  . sodium chloride flush  3 mL Intravenous Q12H    Infusions: . sodium chloride    . sodium chloride    . DOBUTamine 5 mcg/kg/min (03/12/17 2000)  . 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 Started on milrinone but switched to dobutamine due to hypotension.   Plan/Discussion:     1. Acute/Chronic systolic HFwith low output - Echo 07/29/2016 LVEF 25%, Mild AI, Mild MR, Mod LAE. - Echo performed at bedside on 11/14 15-20%.  - Echo 03/11/17 with LVEF 20-25%, Mild MR, Mild/Mod MR, Severe LAE, Mildly reduced RV, PA peak pressure 56 mm Hg.   - RHC 03/10/17 wutg volume overload and low output heart failure.  - Failed milrinone due to hypotension. Now on dobutamine 5 mcg/kg/min. Coox remains marginal at 54.8%.  - Pt remains very reluctant about advanced therapy consideration (LVAD). Blood Type O+ - CVP 9-10 cm. Continue IV lasix 80 mg BID.  - Continue losartan 12.5 mg daily. Pressures remains soft at 90-100 this am.  - Avoid spiro or digoxin with CKD.  -Continueivabradine 2.5mg  daily.  -No bb with low output.  - Could not tolerate Bidil, caused hypotension. 2. CAD s/p PCI to mid RCA and PLOM with DES x 2 and DES to mid LAD in 1/18.  - Continue ASA and statin  - Has been off Brilinta with epicardial lead and possible need for mechanical support 3. PAF - Maintaining NSR. Continue Amiodarone. - Continue eliquis 5 mg BID. - Hgb stable at 11.1 4. Acute on CKD stage III - Improved back on dobutamine.  5. Tobacco abuse. -Stopped smoking in January.No change.  6. LBBB -s/p CRT-D upgrade in July 2018 with epicardial pacing. - Will ask EP to look at programming today.  7. Frequent PVCs -Continue ranexa and amio. No change.  8. Hypokalemia.  - Stable at 3.9 this am. D/w PharmD  Length of Stay: 3  Shirley Friar, PA-C  03/13/2017, 7:57 AM  Advanced Heart Failure Team Pager [phone] (M-F; 7a - 4p)  Please contact [facility] for night-coverage after hours (4p -7a ) and weekends on [email or web address]  Patient seen and  examined with the above-signed Advanced Practice Provider and/or Housestaff. I personally reviewed laboratory data, imaging studies and relevant notes. I independently examined the patient and formulated the important aspects of the plan. I have edited the note to reflect any of my changes or salient points. I have personally discussed the plan with the patient and/or family.  Remains on dobutamine for low-output HF. Coox marginal but he feels better. CVP 9-10. Will continue IV diuresis. Long talk with him and LVAD team about options. He is willing to consider transplant or VAD but says he justs needs a little time to get ready for it mentally. Blood type O+ so  suspect time line will not favor transplant. Will placed tunneled PICC for home dobutamine and arrange f/u at [facility] transplant program. In the meantime, will proceed with VAD w/u and will have VAD patient talk to him. I called EP and asked if they could attempt to optimize BIV today.   Total time spent 45 minutes. Over half that time spent discussing above.   Glori Bickers, MD  9:41 AM

## 2017-03-14 ENCOUNTER — Inpatient Hospital Stay (HOSPITAL_COMMUNITY): Payer: Medicare HMO

## 2017-03-14 ENCOUNTER — Encounter (HOSPITAL_COMMUNITY): Payer: Self-pay | Admitting: Interventional Radiology

## 2017-03-14 ENCOUNTER — Encounter (HOSPITAL_COMMUNITY): Payer: Self-pay | Admitting: Adult Health

## 2017-03-14 DIAGNOSIS — Z01810 Encounter for preprocedural cardiovascular examination: Secondary | ICD-10-CM

## 2017-03-14 DIAGNOSIS — I5023 Acute on chronic systolic (congestive) heart failure: Secondary | ICD-10-CM

## 2017-03-14 DIAGNOSIS — Z515 Encounter for palliative care: Secondary | ICD-10-CM

## 2017-03-14 DIAGNOSIS — Z7189 Other specified counseling: Secondary | ICD-10-CM

## 2017-03-14 HISTORY — PX: IR US GUIDE VASC ACCESS RIGHT: IMG2390

## 2017-03-14 HISTORY — PX: IR FLUORO GUIDE CV MIDLINE PICC RIGHT: IMG5212

## 2017-03-14 LAB — COOXEMETRY PANEL
Carboxyhemoglobin: 1.3 % (ref 0.5–1.5)
METHEMOGLOBIN: 1 % (ref 0.0–1.5)
O2 Saturation: 62.3 %
Total hemoglobin: 12.5 g/dL (ref 12.0–16.0)

## 2017-03-14 LAB — PULMONARY FUNCTION TEST
DL/VA % PRED: 67 %
DL/VA: 2.92 ml/min/mmHg/L
DLCO UNC: 10.3 ml/min/mmHg
DLCO cor % pred: 44 %
DLCO cor: 11.31 ml/min/mmHg
DLCO unc % pred: 40 %
FEF 25-75 Pre: 1.19 L/sec
FEF2575-%Pred-Pre: 43 %
FEV1-%PRED-PRE: 70 %
FEV1-Pre: 1.9 L
FEV1FVC-%Pred-Pre: 81 %
FEV6-%Pred-Pre: 86 %
FEV6-Pre: 2.87 L
FEV6FVC-%PRED-PRE: 103 %
FVC-%Pred-Pre: 84 %
FVC-PRE: 2.9 L
PRE FEV1/FVC RATIO: 66 %
Pre FEV6/FVC Ratio: 99 %

## 2017-03-14 LAB — BASIC METABOLIC PANEL
Anion gap: 8 (ref 5–15)
BUN: 11 mg/dL (ref 6–20)
CO2: 26 mmol/L (ref 22–32)
Calcium: 9.2 mg/dL (ref 8.9–10.3)
Chloride: 101 mmol/L (ref 101–111)
Creatinine, Ser: 1.64 mg/dL — ABNORMAL HIGH (ref 0.61–1.24)
GFR calc Af Amer: 54 mL/min — ABNORMAL LOW (ref >60)
GFR, EST NON AFRICAN AMERICAN: 47 mL/min — AB (ref >60)
GLUCOSE: 92 mg/dL (ref 65–99)
POTASSIUM: 4.9 mmol/L (ref 3.5–5.1)
Sodium: 135 mmol/L (ref 135–145)

## 2017-03-14 LAB — LIPID PANEL
CHOL/HDL RATIO: 3.1 ratio
Cholesterol: 117 mg/dL (ref 0–200)
HDL: 38 mg/dL — ABNORMAL LOW (ref >40)
LDL Cholesterol: 60 mg/dL (ref 0–99)
Triglycerides: 96 mg/dL (ref <150)
VLDL: 19 mg/dL (ref 0–40)

## 2017-03-14 LAB — CBC
HEMATOCRIT: 38 % — AB (ref 39.0–52.0)
Hemoglobin: 11.8 g/dL — ABNORMAL LOW (ref 13.0–17.0)
MCH: 24.4 pg — AB (ref 26.0–34.0)
MCHC: 31.1 g/dL (ref 30.0–36.0)
MCV: 78.5 fL (ref 78.0–100.0)
PLATELETS: 304 10*3/uL (ref 150–400)
RBC: 4.84 MIL/uL (ref 4.22–5.81)
RDW: 16.9 % — AB (ref 11.5–15.5)
WBC: 10.8 10*3/uL — ABNORMAL HIGH (ref 4.0–10.5)

## 2017-03-14 LAB — HEPARIN LEVEL (UNFRACTIONATED): HEPARIN UNFRACTIONATED: 0.46 [IU]/mL (ref 0.30–0.70)

## 2017-03-14 LAB — HEPATITIS C ANTIBODY

## 2017-03-14 LAB — HEPATITIS B CORE ANTIBODY, TOTAL: Hep B Core Total Ab: NEGATIVE

## 2017-03-14 LAB — HEPATITIS B SURFACE ANTIBODY,QUALITATIVE: Hep B S Ab: NONREACTIVE

## 2017-03-14 LAB — HEPATITIS B SURFACE ANTIGEN: HEP B S AG: NEGATIVE

## 2017-03-14 MED ORDER — APIXABAN 5 MG PO TABS: 5.0000 mg | ORAL_TABLET | Freq: Two times a day (BID) | ORAL | Status: DC

## 2017-03-14 MED ORDER — LIDOCAINE HCL 1 % IJ SOLN
INTRAMUSCULAR | Status: AC | PRN
  Administered 2017-03-14: 7 mL

## 2017-03-14 MED ORDER — LOSARTAN POTASSIUM 25 MG PO TABS: 12.5000 mg | ORAL_TABLET | Freq: Every day | ORAL | 6 refills | Status: DC

## 2017-03-14 MED ORDER — FENTANYL CITRATE (PF) 100 MCG/2ML IJ SOLN
INTRAMUSCULAR | Status: AC
  Filled 2017-03-14: qty 4

## 2017-03-14 MED ORDER — MIDAZOLAM HCL 2 MG/2ML IJ SOLN
INTRAMUSCULAR | Status: AC | PRN
  Administered 2017-03-14 (×2): 1 mg via INTRAVENOUS

## 2017-03-14 MED ORDER — DOBUTAMINE IN D5W 4-5 MG/ML-% IV SOLN: 5.0000 ug/kg/min | INTRAVENOUS | 0 refills | Status: DC

## 2017-03-14 MED ORDER — FENTANYL CITRATE (PF) 100 MCG/2ML IJ SOLN
INTRAMUSCULAR | Status: AC | PRN
  Administered 2017-03-14: 50 ug via INTRAVENOUS

## 2017-03-14 MED ORDER — FUROSEMIDE 40 MG PO TABS
40.0000 mg | ORAL_TABLET | Freq: Two times a day (BID) | ORAL | Status: DC
  Administered 2017-03-14: 40 mg via ORAL
  Filled 2017-03-14: qty 1

## 2017-03-14 MED ORDER — MIDAZOLAM HCL 2 MG/2ML IJ SOLN
INTRAMUSCULAR | Status: AC
  Filled 2017-03-14: qty 4

## 2017-03-14 MED ORDER — ATORVASTATIN CALCIUM 40 MG PO TABS: 40.0000 mg | ORAL_TABLET | Freq: Every day | ORAL | 6 refills | Status: DC

## 2017-03-14 MED ORDER — FUROSEMIDE 40 MG PO TABS: 40.0000 mg | ORAL_TABLET | Freq: Two times a day (BID) | ORAL | 6 refills | Status: DC

## 2017-03-14 MED ORDER — LIDOCAINE HCL 1 % IJ SOLN
INTRAMUSCULAR | Status: AC
  Filled 2017-03-14: qty 20

## 2017-03-14 MED ORDER — APIXABAN 5 MG PO TABS
5.0000 mg | ORAL_TABLET | Freq: Two times a day (BID) | ORAL | Status: DC
  Administered 2017-03-14: 5 mg via ORAL
  Filled 2017-03-14: qty 1

## 2017-03-14 NOTE — Care Management Note (Addendum)
Case Management Note  Patient Details  Name: David [name] MRN: [id number] Date of Birth: 07-25-1965  Subjective/Objective:     Pt admitted with HF               Action/Plan:  PTA from home independent.  Pt will discharge home on IV dobutamine - choice offered for [facility] and DME - pt chose [facility] - agency contacted and referral accepted.     Expected Discharge Date:  03/14/17               Expected Discharge Plan:  [place]  In-House Referral:     Discharge planning Services  CM Consult  Post Acute Care Choice:    Choice offered to:     DME Arranged:  IV pump/equipment DME Agency:  [place]:  RN [facility] Agency:  [place]  Status of Service:  In process, will continue to follow  If discussed at Long Length of Stay Meetings, dates discussed:    Additional Comments:  03/14/2017 Pt discharge home today with wife.  [facility] aware of discharge today Maryclare Labrador, RN 03/14/2017, 3:00 PM

## 2017-03-14 NOTE — Progress Notes (Signed)
[facility] will provide [place] for home Dobutamine upon DC.  [facility] is prepared to support DC today once CVC is placed.  If patient discharges after hours, please call [phone].   [name] 03/14/2017, 9:24 AM

## 2017-03-14 NOTE — Progress Notes (Signed)
Preliminary results by tech - Venous Duplex Lower Ext. Completed. Negative for deep and superficial vein thrombosis in both legs.

## 2017-03-14 NOTE — Procedures (Signed)
Interventional Radiology Procedure Note  Procedure: Tunneled right IJ central line  Complications: None  Estimated Blood Loss: < 10 mL  24 cm, 6 Fr DL Power Line placed via right IJ with tip at SVC/RA junction.  David [name]. David [name], M.D Pager:  [phone]

## 2017-03-14 NOTE — Progress Notes (Signed)
Transported to IR for PICC line insertion.

## 2017-03-14 NOTE — Progress Notes (Signed)
Transported  down to Resp. Dept. for PFT. by wheelchair.

## 2017-03-14 NOTE — Progress Notes (Signed)
Back from IR by bed awake and alert.

## 2017-03-14 NOTE — Consult Note (Signed)
Consultation Note Date: 03/14/2017   Patient Name: David [name]  DOB: 1966-04-24  MRN: [id number]  Age / Sex: 51 y.o., male  PCP: Raina Mina., MD Referring Physician: Jolaine Artist, MD  Reason for Consultation: LVAD evaluation  HPI/Patient Profile: 51 y.o. male  with past medical history of systolic CHF EF 30-09%, CAD, HLD, HTN, s/p AICD, MI, CKD stage 3, seizure admitted on 03/10/2017 with 2 weeks of worsening SOB, DOE, and orthopnea. Patient requiring inotropic support now. Palliative requested for LVAD evaluation.   Clinical Assessment and Goals of Care: I met today with David [name] and his wife at bedside. We discussed potential for LVAD. David [name] is repeatedly adamant that we will not worsen to need LVAD and is extremely hopeful for transplant. However, he does say that he is more than willing to proceed with LVAD if this is what is needed to keep him alive for his goal to be with his wife and see his grandchildren grow up (4 grandchildren 49 yo to 56 mo of age). I tried to stress that timing will be key as he cannot what too long until his body declines where he either cannot receive LVAD or struggles with recovery. I encouraged him to follow advice from heart failure team. Also reiterated that he is still in process of work up for LVAD. He also shares that the LVAD patient he recently met had nothing good to say about LVAD and still feels terrible and SOB (discussed with Kennyth Lose, CSW that he might benefit from meeting patient who has done well with LVAD as well).   I also spoke with wife privately outside of room. She is tearful and shares that she does not feel that her husband truly understands the severity of his disease process and poor prognosis. She is very fearful that he will put off LVAD until it is too late. She requests that the team be clear about timing of when he needs to proceed with  LVAD placement if he is a candidate. Her main goal is for his comfort and QOL. She says that they have had some discussion regarding EOL and that he currently desires full resuscitation (he had CPR in January and she says he has declined since this event). She understands that if LVAD and transplant are not an option there will need to be more discussion regarding EOL and his wishes. She is also very overwhelmed at this time and shares that she suffers from chronic depression and is concerned about herself and ability to be his caregiver with LVAD and at EOL. I encouraged her to consider beginning counseling for herself at this time in preparation for whatever the next step is for them. Also shared about LVAD support group if he does have LVAD placed. Emotional support provided.   I shared this conversation with Kennyth Lose, [place] at wife's request.   Primary Decision Maker PATIENT    SUMMARY OF RECOMMENDATIONS   - Appropriate LVAD candidate from palliative perspective.  - Encouraged wife to proceed with  counseling for self care.  - They will need continued support and frank discussion about the need for LVAD if he is found to be good candidate.  - Offered to meet with them outpatient if needed for further palliative discussion and support.   Code Status/Advance Care Planning:  Full code   Symptom Management:   Per heart failure. Home with dobutamine infusion.   Palliative Prophylaxis:   Bowel Regimen  Additional Recommendations (Limitations, Scope, Preferences):  Full Scope Treatment  Psycho-social/Spiritual:   Desire for further Chaplaincy support:no  Additional Recommendations: Caregiving  Support/Resources  Prognosis:   Unable to determine  Discharge Planning: Home with Home Health      Primary Diagnoses: Present on Admission: . Acute on chronic systolic (congestive) heart failure ([place])   I have reviewed the medical record, interviewed the patient and family, and  examined the patient. The following aspects are pertinent.  Past Medical History:  Diagnosis Date  . AICD (automatic cardioverter/defibrillator) present   . CAD (coronary artery disease) 2009   AMI in 12/2007 with PCI to LAD, staged PCI to  mid/distal RCA, NSTEMI in 02/2009 with BMS to LCx  . CHF (congestive heart failure) ([place])   . Chronic kidney disease 11/03/2016   stage 3 kidney disease  . HLD (hyperlipidemia)   . HTN (hypertension)   . Ischemic cardiomyopathy    Admitted in 07/2010 with CHF exacerbation  . Myocardial infarction ([place])   . Seizures ([place]) 11/03/2016   one seizure in 04/2016 during cardiac event   Social History   Socioeconomic History  . Marital status: Married    Spouse name: David [name]  . Number of children: 3  . Years of education: None  . Highest education level: None  Social Needs  . Financial resource strain: None  . Food insecurity - worry: None  . Food insecurity - inability: None  . Transportation needs - medical: None  . Transportation needs - non-medical: None  Occupational History  . None  Tobacco Use  . Smoking status: Former Smoker    Packs/day: 0.20    Years: 30.00    Pack years: 6.00    Last attempt to quit: 04/29/2016    Years since quitting: 0.8  . Smokeless tobacco: Never Used  . Tobacco comment: Used to smoke cigarettes. Pt is currently smoking 3 black & mild cigars per day. He has cut down but never quit.  Substance and Sexual Activity  . Alcohol use: No  . Drug use: No  . Sexual activity: None  Other Topics Concern  . None  Social History Narrative  . None   Family History  Problem Relation Age of Onset  . Coronary artery disease Father   . Hypertension Father   . Hypertension Sister   . Coronary artery disease Brother   . Emphysema Brother   . Coronary artery disease Mother   . Hypertension Mother   . Emphysema Mother   . Hypertension Daughter   . Obesity Daughter   . Diabetes Unknown   . Hypertension Unknown   .  Coronary artery disease Unknown    Scheduled Meds: . amiodarone  200 mg Oral BID  . apixaban  5 mg Oral BID  . aspirin  81 mg Oral Daily  . atorvastatin  40 mg Oral q1800  . furosemide  40 mg Oral BID  . Influenza vac split quadrivalent PF  0.5 mL Intramuscular Tomorrow-1000  . ivabradine  2.5 mg Oral BID WC  . lidocaine      .  losartan  12.5 mg Oral Daily  . multivitamin with minerals  1 tablet Oral Daily  . sodium chloride flush  10-40 mL Intracatheter Q12H  . sodium chloride flush  3 mL Intravenous Q12H  . sodium chloride flush  3 mL Intravenous Q12H   Continuous Infusions: . sodium chloride    . sodium chloride 250 mL (03/13/17 1707)  . DOBUTamine 5 mcg/kg/min (03/13/17 1113)   PRN Meds:.sodium chloride, sodium chloride, acetaminophen, ALPRAZolam, ondansetron (ZOFRAN) IV, sodium chloride flush, sodium chloride flush, sodium chloride flush Allergies  Allergen Reactions  . Plavix [Clopidogrel Bisulfate] Hives   Review of Systems  Constitutional: Positive for activity change, appetite change and fatigue.  Respiratory:       SOB has improved  Neurological: Positive for weakness.    Physical Exam  Constitutional: He is oriented to person, place, and time. He appears well-developed.  HENT:  Head: Normocephalic and atraumatic.  Cardiovascular: Normal rate and regular rhythm.  V paced  Pulmonary/Chest: Effort normal. No accessory muscle usage. No tachypnea. No respiratory distress.  Abdominal: Normal appearance.  Neurological: He is alert and oriented to person, place, and time.  Nursing note and vitals reviewed.   Vital Signs: BP 120/85   Pulse 95   Temp 98.1 F (36.7 C) (Oral)   Resp 20   Ht 5' 5" (1.651 m)   Wt 67.7 kg (149 lb 4.8 oz)   SpO2 91%   BMI 24.84 kg/m  Pain Assessment: No/denies pain POSS *See Group Information*: 1-Acceptable,Awake and alert Pain Score: 0-No pain   SpO2: SpO2: 91 % O2 Device:SpO2: 91 % O2 [name] Rate: .O2 [name] Rate (L/min): 2  L/min  IO: Intake/output summary:   Intake/Output Summary (Last 24 hours) at 03/14/2017 1253 Last data filed at 03/14/2017 1200 Gross per 24 hour  Intake 895.9 ml  Output 2150 ml  Net -1254.1 ml    LBM: Last BM Date: 03/10/17 Baseline Weight: Weight: 73.5 kg (162 lb) Most recent weight: Weight: 67.7 kg (149 lb 4.8 oz)     Palliative Assessment/Data: 50%     Time Total: 60 min  Greater than 50%  of this time was spent counseling and coordinating care related to the above assessment and plan.  Signed by: Vinie Sill, NP Palliative Medicine Team Pager # [phone] (M-F 8a-5p) Team Phone # [phone] (Nights/Weekends)

## 2017-03-14 NOTE — Progress Notes (Signed)
Advanced Heart Failure Rounding Note   Subjective:    PICC line placed. Was off dobutamine 03/12/17 am and restarted due to co-ox in 30s.   Coox 62.3% this am on dobutamine 5 mcg/kg/min.   Feeling good this am on dobutamine. Wants to go home today. Denies SOB. No CP.   Echo 03/11/17 with LVEF 20-25%, Mild MR, Mild/Mod MR, Severe LAE, Mildly reduced RV, PA peak pressure 56 mm Hg.   Objective:   Weight Range:  Vital Signs:   Temp:  [97.5 F (36.4 C)-97.9 F (36.6 C)] 97.9 F (36.6 C) (11/20 0448) Pulse Rate:  [59-67] 62 (11/20 0500) Resp:  [15-26] 20 (11/20 0500) BP: (81-108)/(46-69) 101/69 (11/20 0500) SpO2:  [63 %-98 %] 95 % (11/20 0500) Weight:  [149 lb 4.8 oz (67.7 kg)] 149 lb 4.8 oz (67.7 kg) (11/20 0448) Last BM Date: 03/10/17  Weight change: Filed Weights   03/12/17 0536 03/13/17 0321 03/14/17 0448  Weight: 153 lb 14.1 oz (69.8 kg) 152 lb 8.9 oz (69.2 kg) 149 lb 4.8 oz (67.7 kg)   Intake/Output:   Intake/Output Summary (Last 24 hours) at 03/14/2017 0810 Last data filed at 03/14/2017 0754 Gross per 24 hour  Intake 926.6 ml  Output 2750 ml  Net -1823.4 ml    Physical Exam: General: In bed. NAD.  HEENT: Normal Neck: Supple. JVP 6-7 cm. Carotids 2+ bilat; no bruits. No thyromegaly or nodule noted. Cor: PMI nondisplaced. RRR, +S3 Lungs: CTAB, normal effort. Abdomen: Soft, non-tender, non-distended, no HSM. No bruits or masses. +BS  Extremities: No cyanosis, clubbing, or rash. R and LLE no edema. RUE PICC Neuro: Alert & orientedx3, cranial nerves grossly intact. moves all 4 extremities w/o difficulty. Affect pleasant   Telemetry: NSR with V pacing, personally reviewed.   Labs: Basic Metabolic Panel: Recent Labs  Lab 03/10/17 0552 03/11/17 0618 03/12/17 0540 03/13/17 0427 03/14/17 0449  NA 139 138 138 138 135  K 3.0* 3.6 2.9* 3.9 4.9  CL 103 103 104 105 101  CO2 26 27 28 26 26   GLUCOSE 84 101* 91 87 92  BUN 17 13 11 9 11   CREATININE 1.82*  1.44* 1.42* 1.41* 1.64*  CALCIUM 8.9 8.6* 8.2* 8.7* 9.2  MG  --   --   --  1.8  --    Liver Function Tests: No results for input(s): AST, ALT, ALKPHOS, BILITOT, PROT, ALBUMIN in the last 168 hours. No results for input(s): LIPASE, AMYLASE in the last 168 hours. No results for input(s): AMMONIA in the last 168 hours.  CBC: Recent Labs  Lab 03/08/17 1546 03/11/17 0618 03/12/17 0540 03/13/17 0427 03/14/17 0449  WBC 11.7* 9.6 9.8 10.5 10.8*  HGB 12.6* 11.8* 10.8* 11.1* 11.8*  HCT 39.7 37.3* 34.5* 36.0* 38.0*  MCV 78.3 78.2 77.7* 77.9* 78.5  PLT 332 313 279 287 304   Cardiac Enzymes: No results for input(s): CKTOTAL, CKMB, CKMBINDEX, TROPONINI in the last 168 hours.  BNP: BNP (last 3 results) Recent Labs    05/03/16 0435 05/19/16 1554 07/27/16 1930  BNP 1,034.8* 470.0* 321.6*   ProBNP (last 3 results) No results for input(s): PROBNP in the last 8760 hours.  Other results:  Imaging: Ct Abdomen Pelvis Wo Contrast  Result Date: 03/13/2017 CLINICAL DATA:  Heart failure. EXAM: CT CHEST, ABDOMEN AND PELVIS WITHOUT CONTRAST TECHNIQUE: Multidetector CT imaging of the chest, abdomen and pelvis was performed following the standard protocol without IV contrast. COMPARISON:  CT scan of April 29, 2016. FINDINGS: CT  CHEST FINDINGS Cardiovascular: Atherosclerosis of thoracic aorta is noted without aneurysm formation. Mild cardiomegaly is noted. No pericardial effusion is noted. Left-sided pacemaker is in grossly good position. Right-sided PICC line is noted with distal tip at cavoatrial junction. Coronary artery stents are noted. Mediastinum/Nodes: 2 cm right peritracheal lymph node is noted which is not significantly changed. Stable mildly enlarged anterior mediastinal lymph nodes are noted. Thyroid gland, trachea, and esophagus demonstrate no significant findings. Lungs/Pleura: No pneumothorax is noted. Mild emphysematous disease is noted in the upper lobes bilaterally. Mild left pleural  effusion is noted with adjacent subsegmental atelectasis. Stable 4 mm nodule is noted in right upper lobe best seen on image number 63 of series 5. Musculoskeletal: No chest wall mass or suspicious bone lesions identified. CT ABDOMEN PELVIS FINDINGS Hepatobiliary: No focal liver abnormality is seen. No gallstones, gallbladder wall thickening, or biliary dilatation. Pancreas: Unremarkable. No pancreatic ductal dilatation or surrounding inflammatory changes. Spleen: Normal in size without focal abnormality. Adrenals/Urinary Tract: Adrenal glands are unremarkable. Kidneys are normal, without renal calculi, focal lesion, or hydronephrosis. Bladder is unremarkable. Stomach/Bowel: Stomach is within normal limits. Appendix appears normal. No evidence of bowel wall thickening, distention, or inflammatory changes. Vascular/Lymphatic: Aortic atherosclerosis. No enlarged abdominal or pelvic lymph nodes. Reproductive: Prostate is unremarkable. Other: No abdominal wall hernia or abnormality. No abdominopelvic ascites. Musculoskeletal: No acute or significant osseous findings. IMPRESSION: Mild cardiomegaly. Atherosclerosis of thoracic and abdominal aorta is noted without aneurysm formation. Grossly stable mediastinal adenopathy is noted of indeterminate etiology. Stable 4 mm nodule noted in right upper lobe. Mild emphysematous disease is noted in both upper lobes. No acute abnormality seen in the abdomen or pelvis. Electronically Signed   By: Marijo Conception, M.D.   On: 03/13/2017 14:45   Dg Orthopantogram  Result Date: 03/13/2017 CLINICAL DATA:  Heart failure.  LVAD candidate. EXAM: ORTHOPANTOGRAM/PANORAMIC COMPARISON:  None. FINDINGS: Patient is missing multiple teeth. Existing lower teeth show no evidence for apical lucency to suggest abscess. Upper teeth are not well demonstrated due to motion artifact although there may be a cavity in the second most right upper tooth. IMPRESSION: 1. No evidence for periapical abscess  involving the teeth of the mandible. 2. Upper teeth not well demonstrated although cavity of the second most right upper tooth suspected. Electronically Signed   By: Misty Stanley M.D.   On: 03/13/2017 14:14   Ct Chest Wo Contrast  Result Date: 03/13/2017 CLINICAL DATA:  Heart failure. EXAM: CT CHEST, ABDOMEN AND PELVIS WITHOUT CONTRAST TECHNIQUE: Multidetector CT imaging of the chest, abdomen and pelvis was performed following the standard protocol without IV contrast. COMPARISON:  CT scan of April 29, 2016. FINDINGS: CT CHEST FINDINGS Cardiovascular: Atherosclerosis of thoracic aorta is noted without aneurysm formation. Mild cardiomegaly is noted. No pericardial effusion is noted. Left-sided pacemaker is in grossly good position. Right-sided PICC line is noted with distal tip at cavoatrial junction. Coronary artery stents are noted. Mediastinum/Nodes: 2 cm right peritracheal lymph node is noted which is not significantly changed. Stable mildly enlarged anterior mediastinal lymph nodes are noted. Thyroid gland, trachea, and esophagus demonstrate no significant findings. Lungs/Pleura: No pneumothorax is noted. Mild emphysematous disease is noted in the upper lobes bilaterally. Mild left pleural effusion is noted with adjacent subsegmental atelectasis. Stable 4 mm nodule is noted in right upper lobe best seen on image number 63 of series 5. Musculoskeletal: No chest wall mass or suspicious bone lesions identified. CT ABDOMEN PELVIS FINDINGS Hepatobiliary: No focal liver abnormality is seen.  No gallstones, gallbladder wall thickening, or biliary dilatation. Pancreas: Unremarkable. No pancreatic ductal dilatation or surrounding inflammatory changes. Spleen: Normal in size without focal abnormality. Adrenals/Urinary Tract: Adrenal glands are unremarkable. Kidneys are normal, without renal calculi, focal lesion, or hydronephrosis. Bladder is unremarkable. Stomach/Bowel: Stomach is within normal limits. Appendix  appears normal. No evidence of bowel wall thickening, distention, or inflammatory changes. Vascular/Lymphatic: Aortic atherosclerosis. No enlarged abdominal or pelvic lymph nodes. Reproductive: Prostate is unremarkable. Other: No abdominal wall hernia or abnormality. No abdominopelvic ascites. Musculoskeletal: No acute or significant osseous findings. IMPRESSION: Mild cardiomegaly. Atherosclerosis of thoracic and abdominal aorta is noted without aneurysm formation. Grossly stable mediastinal adenopathy is noted of indeterminate etiology. Stable 4 mm nodule noted in right upper lobe. Mild emphysematous disease is noted in both upper lobes. No acute abnormality seen in the abdomen or pelvis. Electronically Signed   By: Marijo Conception, M.D.   On: 03/13/2017 14:45    Medications:     Scheduled Medications: . amiodarone  200 mg Oral BID  . aspirin  81 mg Oral Daily  . atorvastatin  40 mg Oral q1800  . furosemide  80 mg Intravenous BID  . Influenza vac split quadrivalent PF  0.5 mL Intramuscular Tomorrow-1000  . ivabradine  2.5 mg Oral BID WC  . losartan  12.5 mg Oral Daily  . multivitamin with minerals  1 tablet Oral Daily  . sodium chloride flush  10-40 mL Intracatheter Q12H  . sodium chloride flush  3 mL Intravenous Q12H  . sodium chloride flush  3 mL Intravenous Q12H    Infusions: . sodium chloride    . sodium chloride 250 mL (03/13/17 1707)  . DOBUTamine 5 mcg/kg/min (03/13/17 1113)  . 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 Started on milrinone but switched to dobutamine due to hypotension.   Plan/Discussion:     1. Acute/Chronic systolic HFwith low output - Echo  07/29/2016 LVEF 25%, Mild AI, Mild MR, Mod LAE. - Echo performed at bedside on 11/14 15-20%.  - Echo 03/11/17 with LVEF 20-25%, Mild MR, Mild/Mod MR, Severe LAE, Mildly reduced RV, PA peak pressure 56 mm Hg.   - RHC 03/10/17 wutg volume overload and low output heart failure.  - Failed milrinone due to hypotension. Now on dobutamine 5 mcg/kg/min.  - Coox 62.3% on dobutamine 5 mcg/kg/min. Plan for tunneled PICC today.  Then home.  - Pt remains very reluctant about advanced therapy consideration (LVAD). Blood Type O+ - Continue IV lasix 80 mg BID.  - Continue losartan 12.5 mg daily. Pressures remains soft at 90-100 this am.  - Avoid spiro or digoxin with CKD.  -Continueivabradine 2.5mg  daily.  - No bb with low output.  - Could not tolerate Bidil, caused hypotension. 2. CAD s/p PCI to mid RCA and PLOM with DES x 2 and DES to mid LAD in 1/18.  - No s/s of ischemia.  - Continue ASA and statin  - Has been off Brilinta with epicardial lead and possible need for mechanical support 3. PAF - Maintaining NSR on po amiodarone.  - Continue eliquis 5 mg BID. - Hgb stable at 11.8. 4. Acute on CKD stage III - Improved back on dobutamine.  5. Tobacco abuse. -Stopped  smoking in January. No change.  6. LBBB -s/p CRT-D upgrade in July 2018 with epicardial pacing. - Have asked EP to look at reprogramming.  7. Frequent PVCs -Continue ranexa and amio. No change.  8. Hypokalemia.  - Stable 4.9 this am. D/w PharmD  Length of Stay: [address]  Shirley Friar, PA-C  03/14/2017, 8:10 AM  Advanced Heart Failure Team Pager [phone] (M-F; 7a - 4p)  Please contact [facility] for night-coverage after hours (4p -7a ) and weekends on [email or web address]  Patient seen and examined with the above-signed Advanced Practice Provider and/or Housestaff. I personally reviewed laboratory data, imaging studies and relevant notes. I independently examined the patient and formulated the important aspects of the plan. I  have edited the note to reflect any of my changes or salient points. I have personally discussed the plan with the patient and/or family.  He is feeling better on dobutamine. Volume status looks good. Will place tunneled PICC today and arrange for home inotropes. Will need outpatient eval at [facility] followed by likely VAD placement in near future. Dr. Cyndia Bent to see prior to d/c today.   Glori Bickers, MD  12:40 PM

## 2017-03-14 NOTE — Discharge Summary (Addendum)
Advanced Heart Failure Discharge Note  Discharge Summary   Patient ID: David [name] MRN: [id number], DOB/AGE: August 23, 1965 51 y.o. Admit date: 03/10/2017 D/C date:     03/14/2017   Primary Discharge Diagnoses:   1.Acute/Chronic systolic HFwith low output 2. CAD s/p PCI to mid RCA and PLOM with DES x 2 and DES to mid LAD in 1/18.  3. PAF 4. AKI on CKD stage III 5. Tobacco abuse. 6. LBBB 7. Frequent PVCs 8. Hypokalemia.   Hospital Course:   David [name] is a 51 y.o. male with PMH of ICM ('09 PCI to LAD, mRCA, '10 PCI to Lcx and '12 mRCA), HFrEF (EF ~20% for >5 years), HLD and HTN.   Pt presented for scheduled [facility] 03/10/17 which showed patent LAD and RCA stents. Non-obstructive CAD elsewhere. Hemodynamics consistent with volume overload with low-output physiology. Admitted for IV diuresis and to start work-up for advanced therapies. Pt stared on milrinone. VAD coordinator saw in hospital, and surgeon asked to see time permitting.    Pt diuresed with IV lasix. Pt failed milrinone with hypotension so switched to dobutamine. HF meds cut back with soft pressures.  Echo 03/11/17 with LVEF 20-25%, Mild MR, Mild/Mod MR, Severe LAE, Mildly reduced RV, PA peak pressure 56 mm Hg.   Pt initially tolerated dobutamine wean when stopped 03/11/17, but coox dropped to 30% so restarted.   [facility] contacted for home inotrope support.   Pt had tunneled PICC placed 03/14/17.  VAD work up begun as below. Had long talk with patients wife about the next couple of months. Pt asked for time to process all of the information we had given him.   Overall Pt diuresed 4.4 L and down 13 lbs. He will be discharged to home in stable but tenuous condition, with close follow up.   VAD work up started Blood Type O+  PFTs 03/14/17  FVC 2.90 (84%) FEV1 1.90 (70%) DLCO unc 10.3 (40%) DLCO corrected 11.3 (44%)  Further results as below.   Discharge Weight Range: 149 lbs Discharge Vitals: Blood pressure  120/85, pulse 95, temperature 98.1 F (36.7 C), temperature source Oral, resp. rate 20, height 5\' 5"  (1.651 m), weight 149 lb 4.8 oz (67.7 kg), SpO2 91 %.  Labs: Lab Results  Component Value Date   WBC 10.8 (H) 03/14/2017   HGB 11.8 (L) 03/14/2017   HCT 38.0 (L) 03/14/2017   MCV 78.5 03/14/2017   PLT 304 03/14/2017    Recent Labs  Lab 03/14/17 0449  NA 135  K 4.9  CL 101  CO2 26  BUN 11  CREATININE 1.64*  CALCIUM 9.2  GLUCOSE 92   Lab Results  Component Value Date   CHOL 117 03/14/2017   HDL 38 (L) 03/14/2017   LDLCALC 60 03/14/2017   TRIG 96 03/14/2017   BNP (last 3 results) Recent Labs    05/03/16 0435 05/19/16 1554 07/27/16 1930  BNP 1,034.8* 470.0* 321.6*    ProBNP (last 3 results) No results for input(s): PROBNP in the last 8760 hours.   Diagnostic Studies/Procedures   RHC/LHC 03/10/2017   Dist LAD lesion is 80% stenosed.  Ost Cx to Mid Cx lesion is 40% stenosed.  Dist RCA lesion is 40% stenosed.  Previously placed Prox RCA to Mid RCA stent (unknown type) is widely patent.  Mid RCA lesion is 40% stenosed.  Previously placed Post Atrio stent (unknown type) is widely patent.  Acute Mrg lesion is 99% stenosed.  Previously placed Prox LAD to Dist LAD stent (  unknown type) is widely patent. Prox LAD lesion is 40% stenosed. Ao = 95/55 (71) LV = 93/32 RA = 12 RV = 62/17 PA = 60/27 (37) PCW = 22 (v= 35) Fick cardiac output/index = 3.0/1.6 PVR =5.0 WU SVR = 1580 Ao sat = 98% PA sat = 50%, 52% PaPI = 2/75 RA/PCW = 0.54 RA/LVEDP = 0.38  Ct Abdomen Pelvis Wo Contrast  Result Date: 03/13/2017 CLINICAL DATA:  Heart failure. EXAM: CT CHEST, ABDOMEN AND PELVIS WITHOUT CONTRAST TECHNIQUE: Multidetector CT imaging of the chest, abdomen and pelvis was performed following the standard protocol without IV contrast. COMPARISON:  CT scan of April 29, 2016. FINDINGS: CT CHEST FINDINGS Cardiovascular: Atherosclerosis of thoracic aorta is noted without  aneurysm formation. Mild cardiomegaly is noted. No pericardial effusion is noted. Left-sided pacemaker is in grossly good position. Right-sided PICC line is noted with distal tip at cavoatrial junction. Coronary artery stents are noted. Mediastinum/Nodes: 2 cm right peritracheal lymph node is noted which is not significantly changed. Stable mildly enlarged anterior mediastinal lymph nodes are noted. Thyroid gland, trachea, and esophagus demonstrate no significant findings. Lungs/Pleura: No pneumothorax is noted. Mild emphysematous disease is noted in the upper lobes bilaterally. Mild left pleural effusion is noted with adjacent subsegmental atelectasis. Stable 4 mm nodule is noted in right upper lobe best seen on image number 63 of series 5. Musculoskeletal: No chest wall mass or suspicious bone lesions identified. CT ABDOMEN PELVIS FINDINGS Hepatobiliary: No focal liver abnormality is seen. No gallstones, gallbladder wall thickening, or biliary dilatation. Pancreas: Unremarkable. No pancreatic ductal dilatation or surrounding inflammatory changes. Spleen: Normal in size without focal abnormality. Adrenals/Urinary Tract: Adrenal glands are unremarkable. Kidneys are normal, without renal calculi, focal lesion, or hydronephrosis. Bladder is unremarkable. Stomach/Bowel: Stomach is within normal limits. Appendix appears normal. No evidence of bowel wall thickening, distention, or inflammatory changes. Vascular/Lymphatic: Aortic atherosclerosis. No enlarged abdominal or pelvic lymph nodes. Reproductive: Prostate is unremarkable. Other: No abdominal wall hernia or abnormality. No abdominopelvic ascites. Musculoskeletal: No acute or significant osseous findings. IMPRESSION: Mild cardiomegaly. Atherosclerosis of thoracic and abdominal aorta is noted without aneurysm formation. Grossly stable mediastinal adenopathy is noted of indeterminate etiology. Stable 4 mm nodule noted in right upper lobe. Mild emphysematous disease is  noted in both upper lobes. No acute abnormality seen in the abdomen or pelvis. Electronically Signed   By: Marijo Conception, M.D.   On: 03/13/2017 14:45   Dg Orthopantogram  Result Date: 03/13/2017 CLINICAL DATA:  Heart failure.  LVAD candidate. EXAM: ORTHOPANTOGRAM/PANORAMIC COMPARISON:  None. FINDINGS: Patient is missing multiple teeth. Existing lower teeth show no evidence for apical lucency to suggest abscess. Upper teeth are not well demonstrated due to motion artifact although there may be a cavity in the second most right upper tooth. IMPRESSION: 1. No evidence for periapical abscess involving the teeth of the mandible. 2. Upper teeth not well demonstrated although cavity of the second most right upper tooth suspected. Electronically Signed   By: Misty Stanley M.D.   On: 03/13/2017 14:14   Ct Chest Wo Contrast  Result Date: 03/13/2017 CLINICAL DATA:  Heart failure. EXAM: CT CHEST, ABDOMEN AND PELVIS WITHOUT CONTRAST TECHNIQUE: Multidetector CT imaging of the chest, abdomen and pelvis was performed following the standard protocol without IV contrast. COMPARISON:  CT scan of April 29, 2016. FINDINGS: CT CHEST FINDINGS Cardiovascular: Atherosclerosis of thoracic aorta is noted without aneurysm formation. Mild cardiomegaly is noted. No pericardial effusion is noted. Left-sided pacemaker is in grossly good  position. Right-sided PICC line is noted with distal tip at cavoatrial junction. Coronary artery stents are noted. Mediastinum/Nodes: 2 cm right peritracheal lymph node is noted which is not significantly changed. Stable mildly enlarged anterior mediastinal lymph nodes are noted. Thyroid gland, trachea, and esophagus demonstrate no significant findings. Lungs/Pleura: No pneumothorax is noted. Mild emphysematous disease is noted in the upper lobes bilaterally. Mild left pleural effusion is noted with adjacent subsegmental atelectasis. Stable 4 mm nodule is noted in right upper lobe best seen on image  number 63 of series 5. Musculoskeletal: No chest wall mass or suspicious bone lesions identified. CT ABDOMEN PELVIS FINDINGS Hepatobiliary: No focal liver abnormality is seen. No gallstones, gallbladder wall thickening, or biliary dilatation. Pancreas: Unremarkable. No pancreatic ductal dilatation or surrounding inflammatory changes. Spleen: Normal in size without focal abnormality. Adrenals/Urinary Tract: Adrenal glands are unremarkable. Kidneys are normal, without renal calculi, focal lesion, or hydronephrosis. Bladder is unremarkable. Stomach/Bowel: Stomach is within normal limits. Appendix appears normal. No evidence of bowel wall thickening, distention, or inflammatory changes. Vascular/Lymphatic: Aortic atherosclerosis. No enlarged abdominal or pelvic lymph nodes. Reproductive: Prostate is unremarkable. Other: No abdominal wall hernia or abnormality. No abdominopelvic ascites. Musculoskeletal: No acute or significant osseous findings. IMPRESSION: Mild cardiomegaly. Atherosclerosis of thoracic and abdominal aorta is noted without aneurysm formation. Grossly stable mediastinal adenopathy is noted of indeterminate etiology. Stable 4 mm nodule noted in right upper lobe. Mild emphysematous disease is noted in both upper lobes. No acute abnormality seen in the abdomen or pelvis. Electronically Signed   By: Marijo Conception, M.D.   On: 03/13/2017 14:45   Ir US Guide Vasc Access Right  Result Date: 03/14/2017 CLINICAL DATA:  Heart failure and need for tunneled central venous catheter for home IV administration. EXAM: TUNNELED CENTRAL VENOUS CATHETER PLACEMENT WITH ULTRASOUND AND FLUOROSCOPIC GUIDANCE ANESTHESIA/SEDATION: 2.0 mg IV Versed; 50 mcg IV Fentanyl. Total Moderate Sedation Time:  13  minutes. The patient's level of consciousness and physiologic status were continuously monitored during the procedure by Radiology nursing. MEDICATIONS: No additional medications. FLUOROSCOPY TIME:  12 seconds.  1.3 mGy.  PROCEDURE: The procedure, risks, benefits, and alternatives were explained to the patient. Questions regarding the procedure were encouraged and answered. The patient understands and consents to the procedure. A timeout was performed prior to initiating the procedure. The right neck and chest were prepped with chlorhexidine in a sterile fashion, and a sterile drape was applied covering the operative field. Maximum barrier sterile technique with sterile gowns and gloves were used for the procedure. Local anesthesia was provided with 1% lidocaine. Ultrasound was used to confirm patency of the right internal jugular vein. After creating a small venotomy incision, a 21 gauge needle was advanced into the right internal jugular vein under direct, real-time ultrasound guidance. Ultrasound image documentation was performed. After securing guidewire access, a 6 French peel-away sheath was placed. A wire was kinked to measure appropriate catheter length. A 6 Fr tunneled dual lumen Power Line was tunneled in a retrograde fashion from the chest wall to the venotomy incision. The catheter cuff was appropriately positioned in the subcutaneous tunnel. The catheter was cut to 24 cm based on guidewire measurement. The catheter was then placed through the sheath and the sheath removed. Final catheter positioning was confirmed and documented with a fluoroscopic spot image. The catheter was aspirated and flushed with saline. The venotomy incision was closed with subcutaneous subcuticular 4-0 Vicryl. Dermabond was applied to the incision. The catheter exit site was secured with 0-Prolene  retention sutures. COMPLICATIONS: None.  No pneumothorax. FINDINGS: After catheter placement, the tip lies at the SVC/ RA junction. The catheter aspirates normally and is ready for immediate use. IMPRESSION: Placement of tunneled central venous catheter via the right internal jugular vein. The catheter tip lies at the SVC/ RA junction. The catheter is  ready for immediate use. Electronically Signed   By: Aletta Edouard M.D.   On: 03/14/2017 11:25   Ir Fluoro Guide Cv Midline Picc Right  Result Date: 03/14/2017 CLINICAL DATA:  Heart failure and need for tunneled central venous catheter for home IV administration. EXAM: TUNNELED CENTRAL VENOUS CATHETER PLACEMENT WITH ULTRASOUND AND FLUOROSCOPIC GUIDANCE ANESTHESIA/SEDATION: 2.0 mg IV Versed; 50 mcg IV Fentanyl. Total Moderate Sedation Time:  13  minutes. The patient's level of consciousness and physiologic status were continuously monitored during the procedure by Radiology nursing. MEDICATIONS: No additional medications. FLUOROSCOPY TIME:  12 seconds.  1.3 mGy. PROCEDURE: The procedure, risks, benefits, and alternatives were explained to the patient. Questions regarding the procedure were encouraged and answered. The patient understands and consents to the procedure. A timeout was performed prior to initiating the procedure. The right neck and chest were prepped with chlorhexidine in a sterile fashion, and a sterile drape was applied covering the operative field. Maximum barrier sterile technique with sterile gowns and gloves were used for the procedure. Local anesthesia was provided with 1% lidocaine. Ultrasound was used to confirm patency of the right internal jugular vein. After creating a small venotomy incision, a 21 gauge needle was advanced into the right internal jugular vein under direct, real-time ultrasound guidance. Ultrasound image documentation was performed. After securing guidewire access, a 6 French peel-away sheath was placed. A wire was kinked to measure appropriate catheter length. A 6 Fr tunneled dual lumen Power Line was tunneled in a retrograde fashion from the chest wall to the venotomy incision. The catheter cuff was appropriately positioned in the subcutaneous tunnel. The catheter was cut to 24 cm based on guidewire measurement. The catheter was then placed through the sheath and the  sheath removed. Final catheter positioning was confirmed and documented with a fluoroscopic spot image. The catheter was aspirated and flushed with saline. The venotomy incision was closed with subcutaneous subcuticular 4-0 Vicryl. Dermabond was applied to the incision. The catheter exit site was secured with 0-Prolene retention sutures. COMPLICATIONS: None.  No pneumothorax. FINDINGS: After catheter placement, the tip lies at the SVC/ RA junction. The catheter aspirates normally and is ready for immediate use. IMPRESSION: Placement of tunneled central venous catheter via the right internal jugular vein. The catheter tip lies at the SVC/ RA junction. The catheter is ready for immediate use. Electronically Signed   By: Aletta Edouard M.D.   On: 03/14/2017 11:25    Discharge Medications   Allergies as of 03/14/2017      Reactions   Plavix [clopidogrel Bisulfate] Hives      Medication List    STOP taking these medications   carvedilol 3.125 MG tablet Commonly known as:  COREG     TAKE these medications   ADULT GUMMY Chew Chew 2 tablets by mouth daily.   amiodarone 200 MG tablet Commonly known as:  PACERONE Take 200 mg by mouth 2 (two) times daily.   apixaban 5 MG Tabs tablet Commonly known as:  ELIQUIS Take 1 tablet (5 mg total) by mouth 2 (two) times daily.   aspirin 81 MG chewable tablet Chew 1 tablet (81 mg total) by mouth daily.  atorvastatin 40 MG tablet Commonly known as:  LIPITOR Take 1 tablet (40 mg total) by mouth daily at 6 PM.   DOBUTamine 4-5 MG/ML-% infusion Commonly known as:  DOBUTREX Inject 352.5 mcg/min into the vein continuous.   furosemide 40 MG tablet Commonly known as:  LASIX Take 1 tablet (40 mg total) by mouth 2 (two) times daily.   ivabradine 5 MG Tabs tablet Commonly known as:  CORLANOR Take 0.5 tablets (2.5 mg total) by mouth 2 (two) times daily with a meal.   LINZESS 290 MCG Caps capsule Generic drug:  linaclotide Take 290 mcg by mouth daily  as needed (constipation).   losartan 25 MG tablet Commonly known as:  COZAAR Take 0.5 tablets (12.5 mg total) by mouth daily. Start taking on:  03/15/2017            Durable Medical Equipment  (From admission, onward)        Start     Ordered   03/14/17 1144  Heart failure home health orders  (Heart failure home health orders / Face to face)  Once    Comments:  Heart Failure Follow-up Care:  Verify follow-up appointments per Patient Discharge Instructions. Confirm transportation arranged. Reconcile home medications with discharge medication list. Remove discontinued medications from use. Assist patient/caregiver to manage medications using pill box. Reinforce low sodium food selection Assessments: Vital signs and oxygen saturation at each visit. Assess home environment for safety concerns, caregiver support and availability of low-sodium foods. Consult Education officer, museum, PT/OT, Dietitian, and CNA based on assessments. Perform comprehensive cardiopulmonary assessment. Notify MD for any change in condition or weight gain of 3 pounds in one day or 5 pounds in one week with symptoms. Daily Weights and Symptom Monitoring: Ensure patient has access to scales. Teach patient/caregiver to weigh daily before breakfast and after voiding using same scale and record.    Teach patient/caregiver to track weight and symptoms and when to notify Provider. Activity: Develop individualized activity plan with patient/caregiver.  [facility] to provide  J1250 Dobutamine 5 mcg/kg/min X 52 weeks.  [place] for maintenance of drug infusion catheter A4222 Supplies for the external drug infusion per cassette or bag [phone] Ambulatory Infusion pump  Question Answer Comment  Heart Failure Follow-up Care Advanced Heart Failure (AHF) Clinic at [phone]   Obtain the following labs Basic Metabolic Panel   Lab frequency Other see comments   Fax lab results to AHF Clinic at [phone]   Diet Low Sodium  Heart Healthy   Fluid restrictions: 2000 mL Fluid   Initiate Heart Failure Clinic Diuretic Protocol to be used by [place] only ( to be ordered by Heart Failure Team Providers Only) Yes      03/14/17 1143      Disposition   The patient will be discharged in stable but tenuous condition to home.  Discharge Instructions    (HEART FAILURE PATIENTS) Call MD:  Anytime you have any of the following symptoms: 1) 3 pound weight gain in 24 hours or 5 pounds in 1 week 2) shortness of breath, with or without a dry hacking cough 3) swelling in the hands, feet or stomach 4) if you have to sleep on extra pillows at night in order to breathe.   Complete by:  As directed    Diet - low sodium heart healthy   Complete by:  As directed    Increase activity slowly   Complete by:  As directed    STOP any activity that  causes chest pain, shortness of breath, dizziness, sweating, or exessive weakness   Complete by:  As directed      Follow-up Information    [place] Follow up on 03/23/2017.   Specialty:  Cardiology Why:  at 1130 for post hospital follow up. The code for parking is 8001.  Please bring all of your medications to your visit.  Contact information: [address] [id number] [place] [phone]            Duration of Discharge Encounter: Greater than 35 minutes   Signed, Annamaria Helling 03/14/2017, 3:53 PM  Patient seen and examined with the above-signed Advanced Practice Provider and/or Housestaff. I personally reviewed laboratory data, imaging studies and relevant notes. I independently examined the patient and formulated the important aspects of the plan. I have edited the note to reflect any of my changes or salient points. I have personally discussed the plan with the patient and/or family.  He is improved today. Ready for discharge on home inotropes. Will have f/u  at [facility] to assess eligibility for transplant. VAD w/u underway. Follow closely in HF Clinic.  Glori Bickers, MD  9:59 AM

## 2017-03-14 NOTE — Progress Notes (Signed)
Discharged home accompanied by wife, discharge instructions given to pt. Belongings taken home. 

## 2017-03-14 NOTE — Progress Notes (Signed)
[place] for IV heparin/Eliquis Indication: atrial fibrillation  Allergies  Allergen Reactions  . Plavix [Clopidogrel Bisulfate] Hives    Patient Measurements: Height: 5\' 5"  (165.1 cm) Weight: 149 lb 4.8 oz (67.7 kg) IBW/kg (Calculated) : 61.5 Heparin Dosing Weight: 73.5 kg  Vital Signs: Temp: 98.1 F (36.7 C) (11/20 0800) Temp Source: Oral (11/20 0800) BP: 117/77 (11/20 0800) Pulse Rate: 74 (11/20 0800)  Labs: Recent Labs    03/11/17 0933  03/12/17 0540 03/13/17 0427 03/13/17 1130 03/14/17 0449  HGB  --    < > 10.8* 11.1*  --  11.8*  HCT  --   --  34.5* 36.0*  --  38.0*  PLT  --   --  279 287  --  304  APTT 70*  --   --   --  80*  --   LABPROT  --   --   --   --  14.3  --   INR  --   --   --   --  1.11  --   HEPARINUNFRC 0.59  --  0.43 0.48  --  0.46  CREATININE  --   --  1.42* 1.41*  --  1.64*   < > = values in this interval not displayed.    Estimated Creatinine Clearance: 45.8 mL/min (A) (by C-G formula based on SCr of 1.64 mg/dL (H)).   Medical History: Past Medical History:  Diagnosis Date  . AICD (automatic cardioverter/defibrillator) present   . CAD (coronary artery disease) 2009   AMI in 12/2007 with PCI to LAD, staged PCI to  mid/distal RCA, NSTEMI in 02/2009 with BMS to LCx  . CHF (congestive heart failure) ([place])   . Chronic kidney disease 11/03/2016   stage 3 kidney disease  . HLD (hyperlipidemia)   . HTN (hypertension)   . Ischemic cardiomyopathy    Admitted in 07/2010 with CHF exacerbation  . Myocardial infarction ([place])   . Seizures ([place]) 11/03/2016   one seizure in 04/2016 during cardiac event    Medications:  Infusions:  . sodium chloride    . sodium chloride 250 mL (03/13/17 1707)  . DOBUTamine 5 mcg/kg/min (03/13/17 1113)  . heparin 1,200 Units/hr (03/13/17 1550)    Assessment: 51 yo male admitted after cath lab for milrinone initiation. On Eliquis PTA, currently stopped.   Heparin level  at goal this morning. CBC within normal limits. No issues noted. Planned PICC placement today to continue dobutmaine treatment at home. Eliquis to be resumed tonight after PICC placement.  Goal of Therapy:  Heparin level 0.3-0.7 units/ml Monitor platelets by anticoagulation protocol: Yes   Plan:  Continue heparin at 1200 units/hr until 2200 tonight Eliquis 5 mg po BID to start tonight at [place], PharmD Pharmacy Resident Pager: [phone]

## 2017-03-14 NOTE — Progress Notes (Signed)
Preliminary results by tech - Prevad Study completed. Carotid evaluation -1-39% stenosis in bilateral carotid arteries. ABI evaluation - right 0.91 and left 0.86, which is suggestive of mild arterial insufficiency disease at rest.  Oda Cogan, BS, RDMS, RVT

## 2017-03-15 ENCOUNTER — Telehealth: Payer: Self-pay | Admitting: Licensed Clinical Social Worker

## 2017-03-15 DIAGNOSIS — I2510 Atherosclerotic heart disease of native coronary artery without angina pectoris: Secondary | ICD-10-CM | POA: Diagnosis not present

## 2017-03-15 NOTE — Telephone Encounter (Signed)
CSW contacted patient's wife to schedule VAD assessment prior to clinic appointment next Thursday @ 10am. CSW also provided wife with contact to obtain medical bills for [facility] deductible application. Wife appreciative of assistance and will follow up with CSW next week. CSW continues to follow for assistance and VAD work up. David [name], [place], [place]

## 2017-03-15 NOTE — Progress Notes (Signed)
CSW met with patient and wife at bedside to introduce self and explain role of LVAD Education officer, museum and upcoming psychosocial assessment. Patient and wife asked appropriate questions and discussed LVAD program and support group. CSW will follow up tomorrow with plan for LVAD assessment pending HF clinic appointment for next week. Raquel Sarna, [place], [place]

## 2017-03-16 LAB — LUPUS ANTICOAGULANT PANEL
DRVVT: 49.5 s — AB (ref 0.0–47.0)
PTT Lupus Anticoagulant: 44.6 s (ref 0.0–51.9)

## 2017-03-16 LAB — DRVVT MIX: DRVVT MIX: 43 s (ref 0.0–47.0)

## 2017-03-20 ENCOUNTER — Telehealth (HOSPITAL_COMMUNITY): Payer: Self-pay | Admitting: Unknown Physician Specialty

## 2017-03-20 LAB — FACTOR 5 LEIDEN

## 2017-03-20 NOTE — Telephone Encounter (Signed)
Called pt to inform them to disregard the appointment for Dr. Darcey Nora on Friday 11/30. Pt had a lead revision performed by Dr. Cyndia Bent back in July. We will schedule the pt to see Dr. Cyndia Bent in the next week.   Tanda Rockers RN, BSN VAD Coordinator 24/7 Pager [phone]

## 2017-03-20 NOTE — Telephone Encounter (Signed)
Pt informed of appointment with Dr. Darcey Nora scheduled for Friday 11/30 @ 2pm. Caregiver states they will be at the appointment.   Tanda Rockers RN, BSN VAD Coordinator 24/7 Pager [phone]

## 2017-03-23 ENCOUNTER — Encounter (HOSPITAL_COMMUNITY): Payer: Self-pay

## 2017-03-23 ENCOUNTER — Ambulatory Visit (HOSPITAL_COMMUNITY)
Admit: 2017-03-23 | Discharge: 2017-03-23 | Disposition: A | Discharge status: Home / Self Care | Payer: Medicare HMO | Source: Ambulatory Visit | Attending: Cardiology | Admitting: Cardiology

## 2017-03-23 VITALS — BP 108/62 | HR 76 | Wt 156.8 lb

## 2017-03-23 DIAGNOSIS — Z825 Family history of asthma and other chronic lower respiratory diseases: Secondary | ICD-10-CM | POA: Insufficient documentation

## 2017-03-23 DIAGNOSIS — Z79899 Other long term (current) drug therapy: Secondary | ICD-10-CM | POA: Insufficient documentation

## 2017-03-23 DIAGNOSIS — Z8249 Family history of ischemic heart disease and other diseases of the circulatory system: Secondary | ICD-10-CM | POA: Diagnosis not present

## 2017-03-23 DIAGNOSIS — Z87891 Personal history of nicotine dependence: Secondary | ICD-10-CM | POA: Diagnosis not present

## 2017-03-23 DIAGNOSIS — I480 Paroxysmal atrial fibrillation: Secondary | ICD-10-CM | POA: Insufficient documentation

## 2017-03-23 DIAGNOSIS — Z955 Presence of coronary angioplasty implant and graft: Secondary | ICD-10-CM | POA: Diagnosis not present

## 2017-03-23 DIAGNOSIS — Z888 Allergy status to other drugs, medicaments and biological substances status: Secondary | ICD-10-CM | POA: Insufficient documentation

## 2017-03-23 DIAGNOSIS — N179 Acute kidney failure, unspecified: Secondary | ICD-10-CM | POA: Diagnosis not present

## 2017-03-23 DIAGNOSIS — I130 Hypertensive heart and chronic kidney disease with heart failure and stage 1 through stage 4 chronic kidney disease, or unspecified chronic kidney disease: Secondary | ICD-10-CM | POA: Diagnosis not present

## 2017-03-23 DIAGNOSIS — I255 Ischemic cardiomyopathy: Secondary | ICD-10-CM | POA: Diagnosis not present

## 2017-03-23 DIAGNOSIS — N183 Chronic kidney disease, stage 3 (moderate): Secondary | ICD-10-CM | POA: Diagnosis not present

## 2017-03-23 DIAGNOSIS — E876 Hypokalemia: Secondary | ICD-10-CM | POA: Insufficient documentation

## 2017-03-23 DIAGNOSIS — Z7901 Long term (current) use of anticoagulants: Secondary | ICD-10-CM | POA: Diagnosis not present

## 2017-03-23 DIAGNOSIS — I5022 Chronic systolic (congestive) heart failure: Secondary | ICD-10-CM | POA: Insufficient documentation

## 2017-03-23 DIAGNOSIS — Z7982 Long term (current) use of aspirin: Secondary | ICD-10-CM | POA: Diagnosis not present

## 2017-03-23 DIAGNOSIS — I252 Old myocardial infarction: Secondary | ICD-10-CM | POA: Diagnosis not present

## 2017-03-23 DIAGNOSIS — I2510 Atherosclerotic heart disease of native coronary artery without angina pectoris: Secondary | ICD-10-CM | POA: Diagnosis not present

## 2017-03-23 DIAGNOSIS — I447 Left bundle-branch block, unspecified: Secondary | ICD-10-CM | POA: Insufficient documentation

## 2017-03-23 DIAGNOSIS — Z95810 Presence of automatic (implantable) cardiac defibrillator: Secondary | ICD-10-CM | POA: Insufficient documentation

## 2017-03-23 DIAGNOSIS — E785 Hyperlipidemia, unspecified: Secondary | ICD-10-CM | POA: Diagnosis not present

## 2017-03-23 LAB — BASIC METABOLIC PANEL
Anion gap: 9 (ref 5–15)
BUN: 12 mg/dL (ref 6–20)
CHLORIDE: 104 mmol/L (ref 101–111)
CO2: 22 mmol/L (ref 22–32)
CREATININE: 1.35 mg/dL — AB (ref 0.61–1.24)
Calcium: 8.8 mg/dL — ABNORMAL LOW (ref 8.9–10.3)
GFR calc Af Amer: >60 mL/min (ref >60)
GFR calc non Af Amer: 59 mL/min — ABNORMAL LOW (ref >60)
GLUCOSE: 86 mg/dL (ref 65–99)
POTASSIUM: 3.6 mmol/L (ref 3.5–5.1)
Sodium: 135 mmol/L (ref 135–145)

## 2017-03-23 LAB — CBC
HEMATOCRIT: 36.5 % — AB (ref 39.0–52.0)
HEMOGLOBIN: 11.8 g/dL — AB (ref 13.0–17.0)
MCH: 24.8 pg — ABNORMAL LOW (ref 26.0–34.0)
MCHC: 32.3 g/dL (ref 30.0–36.0)
MCV: 76.7 fL — ABNORMAL LOW (ref 78.0–100.0)
PLATELETS: 324 10*3/uL (ref 150–400)
RBC: 4.76 MIL/uL (ref 4.22–5.81)
RDW: 16.2 % — AB (ref 11.5–15.5)
WBC: 9.4 10*3/uL (ref 4.0–10.5)

## 2017-03-23 NOTE — Progress Notes (Signed)
Met with patient and his family to answer questions about HM III LVAD. Pt is completing his evaluation and is scheduled to see dentist next week along with VAD surgeon, Dr. Cyndia Bent. [facility] has reached out ot patient re: transplant evaluation; pt has appt with Dr. Mosetta Pigeon next week.  Pt states he is feeling "much better" since being discharged home on Milrinone. After thinking about potential LVAD, he has decided to proceed. One big concern as stated by him and his entire family is his fear of "having his face covered with sheet".  Pt identified his face was covered when "I coded" and being draped for any procedure brings this fear back. Reassurance given if patient proceeds to VAD implant, his concerns and wishes will be considered.  Provided brief equipment overview of the HeartMate II pump and discussed placement, surgical procedure, peripheral equipment, life-long coumadin therapy, importance of medication adherence and clinic follow up for as long as patient is living on support, life-style modifications, as well as need for caregiver to be successful with this therapy.   We discussed the process of the evaluation period and how a decision was made by the [facility] team whether he would be an appropriate candidate for therapy or not.   Follow-Up Plan: Pt meeting with Raquel Sarna, LSW during this time as well. Pt has f/u appt with Oda Kilts, CHF PA after this session.  Session Time: 30 mins

## 2017-03-23 NOTE — Progress Notes (Signed)
Advanced Heart Failure Medication Review by a Pharmacist  Does the patient  feel that his/her medications are working for him/her?  Yes - says he feels great, the best he has felt   Has the patient been experiencing any side effects to the medications prescribed?  no  Does the patient measure his/her own blood pressure or blood glucose at home?  Not asked at this visit    Does the patient have any problems obtaining medications due to transportation or finances?   no  Understanding of regimen: excellent Understanding of indications: excellent Potential of compliance: excellent Patient understands to avoid NSAIDs. Patient understands to avoid decongestants.  Issues to address at subsequent visits: none    Pharmacist comments: Mr Lemme is a 51 year old male presenting to clinic today with no medication list or bottles. He did not taking his medications yet this morning but reports he is adherent to his current regimen. He had no questions or concerns regarding his meds today.   Time with patient: 10  Preparation and documentation time: 2 Total time: 12

## 2017-03-23 NOTE — Progress Notes (Signed)
Advanced Heart Failure Clinic Note   Primary Cardiologist : Dr. Geraldo Pitter [facility]) Primary HF: Dr. Haroldine Laws   HPI:  David [name] is a 51 y.o. male with a PMH of ICM ('09 PCI to LAD, mRCA, '10 PCI to Lcx and '12 mRCA), HFrEF (EF ~20% for > 5 years), HLD and HTN who presented to [facility] on 04/29/16 after ~ 2 weeks of worsening SOB, DOE, and orthopnea.   Admitted to [facility] ED 04/29/16 for Afib/AFL. Had Brady/PEA arrest, was intubated, started on pressors and transferred to [facility]. Pressors weaned as tolerated. Extubated 05/01/16. Repeat Echo showed LVEF 15-20%.   Cath on 05/05/16 (full report below) with 3V CAD and high grade lesion RCA and LAD. PCWP 37 so diuresed for optimization prior to PCI.  Pt underwent successful stenting of the proximal to mid RCA and PLOM with DES x 2 and successful stenting of the mid LAD with DES 05/09/16. Placed on ASA and Brilinta for 30 days, and then to transition off ASA and onto Eliquis/Xarelto.   Pt noted to have 2 episodes of bradycardia into the 20s with lightheadedness s/p cath. No further episodes after several hours. No intervention required. EP consulted to consider ICD with brady episodes. Felt like more vagal induced and deferred CRT-D consideration for outpatient. Discharge weight 152 lbs.  Admitted 07/27/16-08/02/16 for palpitations and weakness. Found to have frequent PVCs. Ranexa added to amiodarone. ICD placed - unable to get LV lead placed. Has been referred for epicardial lead with Dr. Cyndia Bent Digoxin and spiro stopped due to worsening renal function.  Pt underwent left thoracotomy and insertion of two LV epicardial leads and revision of his CRT system to BiV on 11/07/2016. He saw Dr. Cyndia Bent in Follow up 02/01/2017.  Admitted from cath 03/10/17 which showed low output HF. Started on milrinone, but failed due to hypotension, so switched to dobutamine. Tunneled PICC placed for home inotrope support. HF meds adjusted as tolerated. Work up for  advanced therapies started. VAD coordinator saw, and HFSW set up meeting. Sent home on dobutamine 5 mcg/kg/min via [facility]  He presents today for post hospital follow up. He's feeling great since discharge. Sleeping very well, with no more orthopnea. He denies lightheadedness or dizziness. His SOB has much improved. He is no longer SOB with ADLs and can get around the house fine.  PICC site stable, nurse checking daily at this time.  Denies CP. Appetite has much improved, as well has energy level. Both he and his family have questions about VAD verus transplant decision and timing.   ICD interrogation sent: Fluid index below threshold with gentle uptrend since discharge. Thoracic impedence slightly below threshold. Pt activity with dip last week but coming back up to > 1 hr. No VT/VF  Review of systems complete and found to be negative unless listed in HPI.    Echo 03/11/2017  LVEF 20-25%, Mild AI, Mild/Mod MR, Severe LAE, Mild RV reduction, PA peak pressure 56 mm Hg  RHC/LHC 03/10/2017  Dist LAD lesion is 80% stenosed.  Ost Cx to Mid Cx lesion is 40% stenosed.  Dist RCA lesion is 40% stenosed.  Previously placed Prox RCA to Mid RCA stent (unknown type) is widely patent.  Mid RCA lesion is 40% stenosed.  Previously placed Post Atrio stent (unknown type) is widely patent.  Acute Mrg lesion is 99% stenosed.  Previously placed Prox LAD to Dist LAD stent (unknown type) is widely patent. Prox LAD lesion is 40% stenosed. Ao = 95/55 (71) LV =  93/32 RA = 12 RV = 62/17 PA = 60/27 (37) PCW = 22 (v= 35) Fick cardiac output/index = 3.0/1.6 PVR =5.0 WU SVR = 1580 Ao sat = 98% PA sat = 50%, 52% PaPI = 2/75 RA/PCW = 0.54 RA/LVEDP = 0.38  Echo 07/29/2016 LVEF 25%, Mild AI, Mild MR, Mod LAE.  RHC/LHC 05/06/2016   Prox RCA to Mid RCA lesion, 80 %stenosed.  Mid RCA lesion, 40 %stenosed.  Dist RCA lesion, 20 %stenosed.  Post Atrio lesion, 80 %stenosed.  Ost Cx to Mid Cx lesion, 40  %stenosed.  Mid LAD to Dist LAD lesion, 70 %stenosed.  Dist LAD lesion, 80 %stenosed. Findings: Ao = 101/75 (87) LV = 102/35 RA = 8 RV = 62/10 PA = 65/34 (47) PCW = 37 (v = 45) Fick cardiac output/index = 3.8/2.1 PVR = 2.9 WU SVR = 1555 FA sat = 96% PA sat = 60%. 61%  Past Medical History:  Diagnosis Date  . AICD (automatic cardioverter/defibrillator) present   . CAD (coronary artery disease) 2009   AMI in 12/2007 with PCI to LAD, staged PCI to  mid/distal RCA, NSTEMI in 02/2009 with BMS to LCx  . CHF (congestive heart failure) ([place])   . Chronic kidney disease 11/03/2016   stage 3 kidney disease  . HLD (hyperlipidemia)   . HTN (hypertension)   . Ischemic cardiomyopathy    Admitted in 07/2010 with CHF exacerbation  . Myocardial infarction ([place])   . Seizures ([place]) 11/03/2016   one seizure in 04/2016 during cardiac event   Current Outpatient Medications  Medication Sig Dispense Refill  . amiodarone (PACERONE) 200 MG tablet Take 200 mg by mouth 2 (two) times daily.    Marland Kitchen apixaban (ELIQUIS) 5 MG TABS tablet Take 1 tablet (5 mg total) by mouth 2 (two) times daily. 60 tablet 1  . aspirin 81 MG chewable tablet Chew 1 tablet (81 mg total) by mouth daily. 30 tablet 6  . atorvastatin (LIPITOR) 40 MG tablet Take 1 tablet (40 mg total) by mouth daily at 6 PM. 30 tablet 6  . DOBUTamine (DOBUTREX) 4-5 MG/ML-% infusion Inject 352.5 mcg/min into the vein continuous. 250 mL 0  . furosemide (LASIX) 80 MG tablet Take 80 mg by mouth daily.    . ivabradine (CORLANOR) 5 MG TABS tablet Take 0.5 tablets (2.5 mg total) by mouth 2 (two) times daily with a meal. 60 tablet 0  . linaclotide (LINZESS) 290 MCG CAPS capsule Take 290 mcg by mouth daily as needed (constipation).    Marland Kitchen losartan (COZAAR) 25 MG tablet Take 0.5 tablets (12.5 mg total) by mouth daily. 15 tablet 6  . Multiple Vitamins-Minerals (ADULT GUMMY) CHEW Chew 2 tablets by mouth daily.     No current facility-administered medications  for this encounter.    Allergies  Allergen Reactions  . Plavix [Clopidogrel Bisulfate] Hives   Social History   Socioeconomic History  . Marital status: Married    Spouse name: [name]  . Number of children: 3  . Years of education: Not on file  . Highest education level: Not on file  Social Needs  . Financial resource strain: Not on file  . Food insecurity - worry: Not on file  . Food insecurity - inability: Not on file  . Transportation needs - medical: Not on file  . Transportation needs - non-medical: Not on file  Occupational History  . Not on file  Tobacco Use  . Smoking status: Former Smoker    Packs/day: 0.20  Years: 30.00    Pack years: 6.00    Last attempt to quit: 04/29/2016    Years since quitting: 0.8  . Smokeless tobacco: Never Used  . Tobacco comment: Used to smoke cigarettes. Pt is currently smoking 3 black & mild cigars per day. He has cut down but never quit.  Substance and Sexual Activity  . Alcohol use: No  . Drug use: No  . Sexual activity: Not on file  Other Topics Concern  . Not on file  Social History Narrative  . Not on file   Family History  Problem Relation Age of Onset  . Coronary artery disease Father   . Hypertension Father   . Hypertension Sister   . Coronary artery disease Brother   . Emphysema Brother   . Coronary artery disease Mother   . Hypertension Mother   . Emphysema Mother   . Hypertension Daughter   . Obesity Daughter   . Diabetes Unknown   . Hypertension Unknown   . Coronary artery disease Unknown    Vitals:   03/23/17 1143  BP: 108/62  Pulse: 76  SpO2: 95%  Weight: 156 lb 12.8 oz (71.1 kg)   Wt Readings from Last 3 Encounters:  03/23/17 156 lb 12.8 oz (71.1 kg)  03/14/17 149 lb 4.8 oz (67.7 kg)  03/08/17 157 lb 12 oz (71.6 kg)    PHYSICAL EXAM: General: Well appearing. No resp difficulty. HEENT: Normal Neck: Supple. JVP 6-7 cm. Carotids 2+ bilat; no bruits. No thyromegaly or nodule noted. RIJ PICC site  looks ok.  Cor: PMI laterally displaced. 2/6 MR soft. +S3. Well healed scar from epicardial lead surgery.  Lungs: CTAB, normal effort. Abdomen: Soft, non-tender, non-distended, no HSM. No bruits or masses. +BS  Extremities: No cyanosis, clubbing, or rash. R and LLE no edema.  Neuro: Alert & orientedx3, cranial nerves grossly intact. moves all 4 extremities w/o difficulty. Affect pleasant   ASSESSMENT & PLAN:  1. Chronic systolic HF - Echo 06/25/1222 LVEF 25%, Mild AI, Mild MR, Mod LAE. - Echo performed at bedside on 11/14 15-20%.  - Echo 03/11/17 with LVEF 20-25%, Mild MR, Mild/Mod MR, Severe LAE, Mildly reduced RV, PA peak pressure 56 mm Hg. - RHC 03/10/17 with volume overload and low output heart failure.  - Failed milrinone due to hypotension. Now on dobutamine 5 mcg/kg/min. Stable.  - Tunneled PICC looks stable on exam.  - Pt had extended meeting with VAD coordinator and HFSW this am. Blood Type O+ - Continue lasix 80 mg daily.  - Continue losartan 12.5 mg daily. - Avoid spiro or digoxin with CKD.  -Continueivabradine 2.5mg  daily.  - No BB with low output.  - Could not tolerate Bidil, caused hypotension. - He sees Dr. Wallie Char at [facility], but appt was for later this month. Dr. Haroldine Laws called personally and had appt moved up to 03/28/2017 2. CAD s/p PCI to mid RCA and PLOM with DES x 2 and DES to mid LAD in 1/18.  - No s/s of ischemia.   - Continue ASA and statin  - Has been off Brilinta with epicardial lead and possible need for mechanical support 3. PAF - Maintaining NSR on po amiodarone.  - Continue eliquis 5 mg BID. - Hgb stable at 11.8 on discharge.  4. Acute on CKD stage III - BMET today. Had improved during admission on dobutamine.  5. Tobacco abuse. -Stopped smoking in January. No change.    6. LBBB -s/p CRT-D upgrade in July 2018  with epicardial pacing. - Have asked EP to look at reprogramming.  7. Frequent PVCs -Continue ranexa and amio. No change.  8.  Hypokalemia.  - BMET today.   Shirley Friar, PA-C 03/23/17  11:58 AM   Patient seen and examined with the above-signed Advanced Practice Provider and/or Housestaff. I personally reviewed laboratory data, imaging studies and relevant notes. I independently examined the patient and formulated the important aspects of the plan. I have edited the note to reflect any of my changes or salient points. I have personally discussed the plan with the patient and/or family.  He is much improved with dobutamine support. Volume status looks good on exam and on ICD interrogation. No VT/AF. Long talk about VAD versus transplant decision and timing. We discussed durability of support and timelines. Given blood type O explained that it may be a wait for transplant. We will refer to [facility] for initial transplant evaluation. If deteriorates in the near future will need VAD sooner rather than later.   Total time spent 45 minutes. Over half that time spent discussing above.   Glori Bickers, MD  2:23 PM

## 2017-03-23 NOTE — Patient Instructions (Signed)
Routine lab work today. Will notify you of abnormal results, otherwise no news is good news!  No changes to medication at this time.  Follow up 2 weeks with Oda Kilts PA-C.  __________________________________________________________ David [name] Code: 8002  Take all medication as prescribed the day of your appointment. Bring all medications with you to your appointment.  Do the following things EVERYDAY: 1) Weigh yourself in the morning before breakfast. Write it down and keep it in a log. 2) Take your medicines as prescribed 3) Eat low salt foods-Limit salt (sodium) to 2000 mg per day.  4) Stay as active as you can everyday 5) Limit all fluids for the day to less than 2 liters

## 2017-03-24 ENCOUNTER — Encounter: Payer: Medicare HMO | Admitting: Cardiothoracic Surgery

## 2017-03-24 ENCOUNTER — Encounter: Payer: Medicare HMO | Admitting: Internal Medicine

## 2017-03-29 ENCOUNTER — Telehealth: Payer: Self-pay | Admitting: Licensed Clinical Social Worker

## 2017-03-29 ENCOUNTER — Encounter (HOSPITAL_COMMUNITY): Payer: Medicare HMO

## 2017-03-29 ENCOUNTER — Ambulatory Visit (HOSPITAL_COMMUNITY): Payer: Self-pay | Admitting: Dentistry

## 2017-03-29 ENCOUNTER — Encounter (HOSPITAL_COMMUNITY): Payer: Self-pay | Admitting: Dentistry

## 2017-03-29 VITALS — BP 124/62 | HR 72 | Temp 97.6°F

## 2017-03-29 DIAGNOSIS — K08409 Partial loss of teeth, unspecified cause, unspecified class: Secondary | ICD-10-CM

## 2017-03-29 DIAGNOSIS — K0530 Chronic periodontitis, unspecified: Secondary | ICD-10-CM

## 2017-03-29 DIAGNOSIS — K06020 Generalized gingival recession, unspecified: Secondary | ICD-10-CM

## 2017-03-29 DIAGNOSIS — I5022 Chronic systolic (congestive) heart failure: Secondary | ICD-10-CM

## 2017-03-29 DIAGNOSIS — K036 Deposits [accretions] on teeth: Secondary | ICD-10-CM

## 2017-03-29 DIAGNOSIS — M264 Malocclusion, unspecified: Secondary | ICD-10-CM

## 2017-03-29 DIAGNOSIS — K06010 Localized gingival recession, unspecified: Secondary | ICD-10-CM

## 2017-03-29 DIAGNOSIS — K083 Retained dental root: Secondary | ICD-10-CM

## 2017-03-29 DIAGNOSIS — K029 Dental caries, unspecified: Secondary | ICD-10-CM

## 2017-03-29 DIAGNOSIS — K045 Chronic apical periodontitis: Secondary | ICD-10-CM

## 2017-03-29 DIAGNOSIS — I255 Ischemic cardiomyopathy: Secondary | ICD-10-CM

## 2017-03-29 DIAGNOSIS — Z9189 Other specified personal risk factors, not elsewhere classified: Secondary | ICD-10-CM

## 2017-03-29 DIAGNOSIS — Z01818 Encounter for other preprocedural examination: Secondary | ICD-10-CM

## 2017-03-29 DIAGNOSIS — K0889 Other specified disorders of teeth and supporting structures: Secondary | ICD-10-CM

## 2017-03-29 NOTE — Progress Notes (Signed)
DENTAL CONSULTATION  Date of Consultation:  03/29/2017 Patient Name:   David [name] Date of Birth:   [birth year] Medical Record Number: [id number]  VITALS: BP 124/62 (BP Location: Left Arm)   Pulse 72   Temp 97.6 F (36.4 C) (Oral)   CHIEF COMPLAINT: Patient referred by Dr. Haroldine Laws for a dental consultation.  HPI: Nykolas Bacallao Is a 51 year old male with ischemic cardiomyopathy and chronic systolic heart failure. Patient with anticipated LVAD placement. Patient is now seen as part of a medically necessary pre-LVAD placement dental protocol examination to rule out dental infection that may affect the patient's systemic health and anticipated LVAD placement.  The patient currently denies acute toothache swellings, or abscesses. Patient has not seen a dentist for a long time. Patient was last seen by dentist in [place], [place] for dental extraction. Patient did have some postoperative swelling and discomfort. Patient denies having partial dentures. 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  PROBLEM LIST: Patient Active Problem List   Diagnosis Date Noted  . Goals of care, counseling/discussion   . Palliative care encounter   . Acute on chronic systolic (congestive) heart failure ([place]) 03/10/2017  . Ischemic cardiomyopathy 11/07/2016  . NSVT (nonsustained ventricular tachycardia) ([place]) 08/02/2016  . PVC (premature ventricular contraction) 08/01/2016  . Near syncope 07/28/2016  . Postural dizziness with presyncope   . Chest pain 07/27/2016  . VT (ventricular tachycardia) ([place]) 07/27/2016  . AKI (acute kidney injury) ([place]) 07/27/2016  . CKD (chronic kidney disease), stage III ([place]) 05/13/2016  . Chronic anemia 05/13/2016  . Hypokalemia 05/13/2016  . PAF (paroxysmal atrial fibrillation) ([place])   . CAD (coronary artery disease), native coronary artery 03/15/2011  . Hypertension, essential 03/15/2011  . Hyperlipidemia 03/15/2011  . Chronic systolic HF  (heart failure) ([place]) 03/15/2011  . STEMI 2009 (anterior), 2010 (lateral), and 2012 (inferior) 03/14/2011    PMH: Past Medical History:  Diagnosis Date  . AICD (automatic cardioverter/defibrillator) present   . CAD (coronary artery disease) 2009   AMI in 12/2007 with PCI to LAD, staged PCI to  mid/distal RCA, NSTEMI in 02/2009 with BMS to LCx  . CHF (congestive heart failure) ([place])   . Chronic kidney disease 11/03/2016   stage 3 kidney disease  . HLD (hyperlipidemia)   . HTN (hypertension)   . Ischemic cardiomyopathy    Admitted in 07/2010 with CHF exacerbation  . Myocardial infarction ([place])   . Seizures ([place]) 11/03/2016   one seizure in 04/2016 during cardiac event    PSH: Past Surgical History:  Procedure Laterality Date  . BIV ICD INSERTION CRT-D N/A 08/01/2016   Procedure: BiV ICD Insertion CRT-D;  Surgeon: Will Meredith Leeds, MD;  Location: [facility];  Service: Cardiovascular;  Laterality: N/A;  . CARDIAC CATHETERIZATION N/A 05/05/2016   Procedure: Right/Left Heart Cath and Coronary Angiography;  Surgeon: Jolaine Artist, MD;  Location: [facility];  Service: Cardiovascular;  Laterality: N/A;  . CARDIAC CATHETERIZATION N/A 05/09/2016   Procedure: Coronary Stent Intervention;  Surgeon: Peter M Martinique, MD;  Location: [facility];  Service: Cardiovascular;  Laterality: N/A;  . CARDIAC CATHETERIZATION N/A 05/09/2016   Procedure: Intravascular Pressure Wire/FFR Study;  Surgeon: Peter M Martinique, MD;  Location: [facility];  Service: Cardiovascular;  Laterality: N/A;  . ELECTROPHYSIOLOGY STUDY N/A 06/24/2016   Procedure: Electrophysiology Study;  Surgeon: Deboraha Sprang, MD;  Location: [facility];  Service: Cardiovascular;  Laterality: N/A;  . EPICARDIAL PACING  LEAD PLACEMENT Left 11/07/2016   Procedure: LV EPICARDIAL PACING LEAD PLACEMENT VIA LEFT MINI THORACOTOMY;  Surgeon: Gaye Pollack, MD;  Location: [facility];  Service: Thoracic;  Laterality: Left;   . ICD IMPLANT N/A 06/24/2016   Procedure: POSSIBLE  ICD Implant;  Surgeon: Deboraha Sprang, MD;  Location: [facility];  Service: Cardiovascular;  Laterality: N/A;  . IR FLUORO GUIDE CV MIDLINE PICC RIGHT  03/14/2017  . IR US GUIDE VASC ACCESS RIGHT  03/14/2017  . LEFT HEART CATHETERIZATION WITH CORONARY ANGIOGRAM N/A 03/13/2011   Procedure: LEFT HEART CATHETERIZATION WITH CORONARY ANGIOGRAM;  Surgeon: Lorretta Harp, MD;  Location: [facility];  Service: Cardiovascular;  Laterality: N/A;  . RIGHT/LEFT HEART CATH AND CORONARY ANGIOGRAPHY N/A 03/10/2017   Procedure: RIGHT/LEFT HEART CATH AND CORONARY ANGIOGRAPHY;  Surgeon: Jolaine Artist, MD;  Location: [facility];  Service: Cardiovascular;  Laterality: N/A;    ALLERGIES: Allergies  Allergen Reactions  . Plavix [Clopidogrel Bisulfate] Hives    MEDICATIONS: Current Outpatient Medications  Medication Sig Dispense Refill  . amiodarone (PACERONE) 200 MG tablet Take 200 mg by mouth 2 (two) times daily.    Marland Kitchen apixaban (ELIQUIS) 5 MG TABS tablet Take 1 tablet (5 mg total) by mouth 2 (two) times daily. 60 tablet 1  . aspirin 81 MG chewable tablet Chew 1 tablet (81 mg total) by mouth daily. 30 tablet 6  . atorvastatin (LIPITOR) 40 MG tablet Take 1 tablet (40 mg total) by mouth daily at 6 PM. 30 tablet 6  . DOBUTamine (DOBUTREX) 4-5 MG/ML-% infusion Inject 352.5 mcg/min into the vein continuous. 250 mL 0  . furosemide (LASIX) 80 MG tablet Take 80 mg by mouth daily.    . ivabradine (CORLANOR) 5 MG TABS tablet Take 0.5 tablets (2.5 mg total) by mouth 2 (two) times daily with a meal. 60 tablet 0  . linaclotide (LINZESS) 290 MCG CAPS capsule Take 290 mcg by mouth daily as needed (constipation).    Marland Kitchen losartan (COZAAR) 25 MG tablet Take 0.5 tablets (12.5 mg total) by mouth daily. 15 tablet 6  . Multiple Vitamins-Minerals (ADULT GUMMY) CHEW Chew 2 tablets by mouth daily.     No current facility-administered medications for this visit.      LABS: Lab Results  Component Value Date   WBC 9.4 03/23/2017   HGB 11.8 (L) 03/23/2017   HCT 36.5 (L) 03/23/2017   MCV 76.7 (L) 03/23/2017   PLT 324 03/23/2017      Component Value Date/Time   NA 135 03/23/2017 1216   NA 141 05/24/2016 1651   K 3.6 03/23/2017 1216   CL 104 03/23/2017 1216   CO2 22 03/23/2017 1216   GLUCOSE 86 03/23/2017 1216   BUN 12 03/23/2017 1216   BUN 21 05/24/2016 1651   CREATININE 1.35 (H) 03/23/2017 1216   CALCIUM 8.8 (L) 03/23/2017 1216   GFRNONAA 59 (L) 03/23/2017 1216   GFRAA >60 03/23/2017 1216   Lab Results  Component Value Date   INR 1.11 03/13/2017   INR 1.55 03/08/2017   INR 1.05 11/03/2016   No results found for: PTT  SOCIAL HISTORY: Social History   Socioeconomic History  . Marital status: Married    Spouse name: [name]  . Number of children: 3  . Years of education: Not on file  . Highest education level: Not on file  Social Needs  . Financial resource strain: Not on file  . Food insecurity - worry: Not on  file  . Food insecurity - inability: Not on file  . Transportation needs - medical: Not on file  . Transportation needs - non-medical: Not on file  Occupational History  . Not on file  Tobacco Use  . Smoking status: Former Smoker    Packs/day: 0.20    Years: 30.00    Pack years: 6.00    Last attempt to quit: 04/29/2016    Years since quitting: 0.9  . Smokeless tobacco: Never Used  . Tobacco comment: Used to smoke cigarettes. Pt is currently smoking 3 black & mild cigars per day. He has cut down but never quit.  Substance and Sexual Activity  . Alcohol use: No  . Drug use: No  . Sexual activity: Not on file  Other Topics Concern  . Not on file  Social History Narrative  . Not on file    FAMILY HISTORY: Family History  Problem Relation Age of Onset  . Coronary artery disease Father   . Hypertension Father   . Hypertension Sister   . Coronary artery disease Brother   . Emphysema Brother   . Coronary  artery disease Mother   . Hypertension Mother   . Emphysema Mother   . Hypertension Daughter   . Obesity Daughter   . Diabetes Unknown   . Hypertension Unknown   . Coronary artery disease Unknown     REVIEW OF SYSTEMS: Reviewed with the patient as per History of present illness. 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  DENTAL HISTORY: CHIEF COMPLAINT: Patient referred by Dr. Haroldine Laws for a dental consultation.  HPI: Anuj Summons Is a 51 year old male with ischemic cardiomyopathy and chronic systolic heart failure. Patient with anticipated LVAD placement. Patient is now seen as part of a medically necessary pre-LVAD placement dental protocol examination to rule out dental infection that may affect the patient's systemic health and anticipated LVAD placement.  The patient currently denies acute toothache swellings, or abscesses. Patient has not seen a dentist for a long time. Patient was last seen by dentist in [place], [place] for dental extraction. Patient did have some postoperative swelling and discomfort. Patient denies having partial dentures. 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  DENTAL EXAMINATION: GENERAL: The patient is a well-developed, well-nourished male in no acute distress. HEAD AND NECK: There is no palpable neck lymphadenopathy. The patient denies acute TMJ symptoms. INTRAORAL EXAM: The patient has normal saliva. There is no evidence of oral abscess formation. DENTITION: The patient has multiple missing teeth numbers 1, 3, 4, 5, 13-16, 18, 19, and 30-32.  The patient has retained root segments in the area of tooth numbers 2, 6, 7, and 8. PERIODONTAL: The patient has chronic periodontitis with plaque and calculus accumulations, generalized gingival recession, and tooth mobility. There is moderate to severe bone loss noted. DENTAL CARIES/SUBOPTIMAL RESTORATIONS: There are multiple dental caries noted as  per dental charting form. ENDODONTIC: The patient denies acute pulpitis symptoms. The patient has multiple areas of periapical pathology and radiolucency associated with tooth numbers 2, 6, 7, and 8. CROWN AND BRIDGE: There are no crown or bridge restorations. PROSTHODONTIC: The patient denies having partial dentures. OCCLUSION: The patient has a poor occlusal scheme secondary to multiple missing teeth, multiple retained root segments, and lack of replacement missing teeth with dental prostheses.  RADIOGRAPHIC INTERPRETATION: An orthopantogram was taken and supplemented with a full series of periapical radiographs. There are multiple missing teeth. There are multiple retained root segments.  There are multiple areas of periapical pathology and radiolucency. Dental caries are noted. There is supra-eruption and drifting of the unopposed teeth into the edentulous areas. There is radiographic calculus noted.There is moderate to severe bone loss.  ASSESSMENTS: 1. Ischemic cardiomyopathy 2. Chronic systolic heart failure 3. Pre-LVAD placement dental protocol examination 4. Chronic apical periodontitis 5. Dental caries 6. Retained root segments 7. Chronic periodontitis with bone loss 8. Accretions 9. Generalized gingival recession 10. Loose teeth 11. Multiple missing teeth 12. Supra-eruption and drifting of the unopposed teeth into the edentulous areas 13. Malocclusion 14. Risk for bleeding with invasive dental procedures due to current Eliquis therapy 15. Risk for complications up to and including death with anticipated invasive dental procedures in the operating room with general anesthesia due to overall medical and cardiovascular compromise.   PLAN/RECOMMENDATIONS: 1. I discussed the risks, benefits, and complications of various treatment options with the patient in relationship to his medical and dental conditions, anticipated LVAD placement, and risk for infection and endocarditis. We  discussed various treatment options to include no treatment, total and subtotal extractions with alveoloplasty, pre-prosthetic surgery as indicated, periodontal therapy, dental restorations, root canal therapy, crown and bridge therapy, implant therapy, and replacement of missing teeth as indicated. The patient currently wishes to proceed with extraction of remaining teeth with alveoloplasty in the operating room with general anesthesia. Patient has been instructed to hold the Eliquis therapy for 2 days prior to the anticipated invasive dental procedures.The operating procedure has been scheduled for April 12, 2017 at 7:30 am at [facility] with plan for admission for overnight observation by Heart Failure team. The patient will then proceed with LVAD procedure with Dr. Cyndia Bent as his discretion after adequate healing from the dental extractions.  2. Discussion of findings with medical team and coordination of future medical and dental care as needed.  I spent in excess of  120 minutes during the conduct of this consultation and >50% of this time involved direct face-to-face encounter for counseling and/or coordination of the patient's care.    Lenn Cal, DDS

## 2017-03-29 NOTE — Telephone Encounter (Signed)
CSW received call from wife with questions regarding LVAD work evaluation and continued work up. Wife also asked about pending Medicaid application and need to obtain medical bills to submit with application. CSW provided customer service number to obtain information needed. Wife reports patient had visit at [facility] this week for transplant evaluation and felt that the recommendation was to pursue LVAD as BTT as transplant may take some time to complete. CSW will follow up with VAD Coordinator to inform and request follow up with patient and wife. CSW continues to be available and will follow for LVAD work up. Raquel Sarna, [place], [place]

## 2017-03-29 NOTE — Patient Instructions (Signed)
[facility]    Department of Dental Medicine     DR. [place] AND MOUTH CARE:  FACTS:   If you have any infection in your mouth, it can infect your heart or heart valve.  If your heart or heart valve is infected, you will be seriously ill.  Infections in the mouth can be SILENT and do not always cause pain.  Examples of infections in the mouth are gum disease, dental cavities, and abscesses.  Some possible signs of infection are: Bad breath, bleeding gums, or teeth that are sensitive to sweets, hot, and/or cold. There are many other signs as well.  WHAT YOU HAVE TO DO:   Brush your teeth after meals and at bedtime. Spend at least 2 minutes brushing well, especially behind your back teeth and all around your teeth that stand alone. Brush at the gumline also.  Do not go to bed without brushing your teeth and flossing.  If you gums bleed when you brush or floss, do NOT stop brushing or flossing. It usually means that your gums need more attention and better cleaning.   If your Dentist or Dr. Enrique Sack gave you a prescription mouthwash to use, make sure to use it as directed. If you run out of the medication, get a refill at the pharmacy.   If you were given any other medications or directions by your Dentist, please follow them. If you did not understand the directions or forget what you were told, please call. We will be happy to refresh her memory.  If you need antibiotics before dental procedures, make sure you take them one hour prior to every dental visit as directed.   Get a dental checkup every 4-6 months in order to keep your mouth healthy, or to find and treat any new infection. You will most likely need your teeth cleaned or gums treated at the same time.  If you are not able to come in for your scheduled appointment, call your Dentist as soon as possible to reschedule.  If you have a problem in between dental visits, call your Dentist.

## 2017-03-30 ENCOUNTER — Encounter: Payer: Medicare HMO | Admitting: Surgery

## 2017-03-30 ENCOUNTER — Telehealth (HOSPITAL_COMMUNITY): Payer: Self-pay | Admitting: *Deleted

## 2017-03-30 NOTE — Telephone Encounter (Signed)
Returned call to [facility] Transplant office; corrected pts' contact information (had wrong phone contact). Pt has been approved for transplant evaluation; [facility] will be contacting patient to schedule upcoming appointments.

## 2017-03-31 ENCOUNTER — Telehealth: Payer: Self-pay | Admitting: Licensed Clinical Social Worker

## 2017-03-31 NOTE — Telephone Encounter (Signed)
CSw received call from wife who was very tearful and feeling overwhelmed with the transplant/VAD process. Wife states she is working on Insurance underwriter and juggling many calls from professionals regarding work up for both VAD and transplant. CSW provided supportive intervention and followed up with VAD Coordinators regarding phone call and support needed. CSW will continue to follow for support and any other needs as they arise. Raquel Sarna, [place], [place]

## 2017-04-03 ENCOUNTER — Encounter: Payer: Medicare HMO | Admitting: Surgery

## 2017-04-03 NOTE — Progress Notes (Signed)
LVAD Initial Psychosocial Screening  Date/Time Initiated:  03/23/17 10:30am Referral Source:  Zada Girt, [place] Coordinator Referral Reason:  LVAD implantation Source of Information:  Pt., wife and chart review  Demographics Name:  David [name] Address:  [address] [place] [address] Home phone:   n/a  Cell: [phone] Marital Status:  Married  Faith:  n/a Primary Language:  Ilda Mori: [phone]    DOB:  1965-10-20  Medical & Follow-up Adherence to Medical regimen/INR checks: compliant  Medication adherence: compliant  Physician/Clinic Appointment Attendance: compliant   Advance Directives: Do you have a Living Will or Medical POA? No  Would you like to complete a Living Will and Medical POA prior to surgery?  Yes Do you have Goals of Care? Yes  Have you had a consult with the Palliative Care Team at [facility]? Pending  Psychological Health Appearance:  Unremarkable Mental Status:  Alert, oriented Eye Contact:  Good Thought Content:  Coherent Speech:  Logical/coherent Mood:  Pleasant  Affect:  Appropriate to circumstance Insight:  Good Judgement: Unimpaired Interaction Style:  Engaged  Family/Social Information Who lives in your home? Name:   Relationship:   David [name]   wife Other family members/support persons in your life? Name:   Relationship:   David [name]   [phone] David [name]  Daughter [phone] David [name]   Daughter [phone] David [name]  [phone]  Caregiving Needs Who is the primary caregiver? [place] status:  fair Do you drive?  yes Do you work?  No- Disability Physical Limitations:  none Do you have other care giving responsibilities?   nobe Contact number:  [phone]  Who is the secondary caregiver? Ashley/ David [name] Health status:  good Do you drive?  yes Do you work?  Yes  Physical Limitations:  none Do you have other care giving responsibilities?  none Contact number: both numbers listed above  Home  Environment/Personal Care Do you have reliable phone service? Yes  If so, what is the number?   [phone] Do you own or rent your home? rent Number of steps into the home? 1 step How many levels in the home? 1 level Assistive devices in the home? none Electrical needs for LVAD (3 prong outlets)? yes Second hand smoke exposure in the home? no Travel distance from [facility]? 35 minutes   Community Are you active with community agencies/resources/homecare? Yes Agency Name: [facility] Are you active in a church, synagogue, mosque or other faith based community? Yes Faith based institutions name: [place] [employer] What other sources do you have for spiritual support? Wife's mother Are you active in any clubs or social organizations? none What do you do for fun?  Hobbies?  Interests?  Sports and fishing  Education/Work Information What is the last grade of school you completed? 12th grade Preferred method of learning?  Written Do you have any problems with reading or writing?  No Are you currently employed?  No  When were you last employed? 2006  Name of employer? 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 15 years If you are not working, do you plan to return to work after VAD surgery? No If yes, what type of employment do you hope to find? n/a Are you interested in job training or learning new skills?  No Did you serve in the [place]? No  If so, what branch? Other  Financial Information What is your source of income?  SSD Do you have difficulty meeting your monthly expenses? Yes Can you budget for the monthly cost for dressing supplies post procedure? Yes  Primary Health insurance:  Humana  Secondary Insurance: Prescription plan: D What are your prescription co-pays? varies Do you use mail order for your prescriptions?  No Have you ever had to refuse medication due to cost?  Yes called MD office for assistance Have you  applied for Medicaid?  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 Do you have a PCP or other medical provider?  Raina Mina., MD Are you able to complete your ADL's? Yes Do you have a history of trauma, physical, emotional, or sexual abuse? none Do you have any family history of heart problems? Parents and Dad died from an MI. Do you smoke now or past usage? past usage    Quit date: April 29, 2016 Do you drink alcohol now or past usage? past usage    Quit date:  Years ago Are you currently using illegal drugs or misuse of medication or past usage? never  Have you ever been treated for substance abuse? No        Mental Health History How have you been feeling in the past year? terrible Have you ever had any problems with depression, anxiety or other mental health issues? Depression and anxiety Do you see a counselor, psychiatrist or therapist?  no If you are currently experiencing problems are you interested in talking with a professional? No Have you or are you taking medications for anxiety/depression or any mental health concerns?  No  What are your coping strategies under stressful situations? Faith and talk through with wife Are there any other stressors in your life? none Have you had any past or current thoughts of suicide? none How many hours do you sleep at night? 7-8 hrs How is your appetite? "raviness" Would you be interested in attending the LVAD support group? Yes PHQ2 Depression Scale: 1  Legal Do you currently have any legal issues/problems?  none  Have you had any legal issues/problems in the past?  none  Plan for VAD Implementation Do you know and understand what happens during the VAD surgery? Patient Verbalizes Understanding  of surgery and able to  describe details What do you know about the risks and side effect associated with VAD surgery? Patient Verbalizes Understanding  of risks (infection, stroke and death) Explain what will happen right after surgery: Patient Verbalizes Understanding  of OR to ICU and will be intubated What is your plan for transportation for the first 8 weeks post-surgery? (Patients are not recommended to drive post-surgery for 8 weeks)  Driver:    Wife and Daughters Do you have airbags in your vehicle?  Yes There is a risk of discharging the device if the airbag were to deploy. What do you know about your diet post-surgery? Patient Verbalizes Understanding  of Heart healthy How do you plan to monitor your medications, current and future?   Wife prefills med box How do you plan to complete ADL's post-surgery?   Will it be difficult to ask for help from your caregivers?  Patient reports he will have wife assist with needs.  Please explain what you hope will be improved about your life as a result of receiving the  LVAD? Patient states I hope to not have SOB, congestion and more energy and play with my grand kids. Please tell me your biggest concern or fear about living with the LVAD?  none How do you cope with your concerns and fears?  Faith Please explain your understanding of how their body will change?  "I will have to overcome it" Do you see any barriers to your surgery or follow-up? none  Understanding of LVAD Patient states understanding of the following: Surgical procedures and risks, Electrical need for LVAD (3 prong outlets), Safety precautions with LVAD (water, etc.), LVAD daily self-care (dressing changes, computer check, extra supplies), Outpatient follow up (LVAD clinic appts, monitoring blood thinners) and Need for Emergency Planning  Discussed and Reviewed with Patient and Caregiver  Patient's current level of motivation to prepare for LVAD: motivated Patient's present Level of Consent for LVAD:  Ready  Comments:  Patient shares his only concern is having a sheet placed over his head prior to be put under as he has claustrophobia and a history of anxiety when his face is covered.  Education provided to patient/family/caregiver:   Caregiver role and responsibiltiy, Financial planning for LVAD, Role of Clinical Education officer, museum and Signs of Depression and Anxiety  Comments:    Caregiver questions Please explain what you hope will be improved about your life and loved one's life as a result of receiving the LVAD?  Wife and Daughters share hope for increased energy, able to enjoy life and go back to fishing and playing with grand kids. What is your biggest concern or fear about caregiving with an LVAD patient?  Death What is your plan for availability to provide care 24/7 x2 weeks post op and dressing changes ongoing?  Wife and daughters as back ups Who is the relief/backup caregiver and what is their availability? Daughters David [name]/Ashley   Preferred method of learning? Hands on  Do you drive?  yes How do you handle stressful situations?   Cry, Talk with my Mom and pastor Do you think you can do this?  Yes Is there anything that concerns about caregiving?  No, the VAD pager is a relief Do you provide caregiving to anyone else?  no   Caregiver's current level of motivation to prepare for LVAD: Supportive and motivated Caregiver's present level of consent for LVAD: Ready  Clinical Interventions Needed:    CSW will monitor signs and symptoms of depression and assist with adjustment to life with an LVAD. CSW will refer patient for Advanced Directives upon admission if not completed prior to surgery if still wishing to complete. CSW will follow up with wife to assist further with pending medicaid application. CSW encouraged attendance with the LVAD Support Group to assist further with adjustment and post implant peer support. Clinical Impressions/Recommendations:   Mr. Klang is a 51yo male  who is married and has 3 adult children and one "adopted daughter". Patient states adherence to prescribed medical regimen and if unable to afford medications will reach out to MD office for assistance. He states no advanced directives but interested in completing prior to surgery. He has a very supportive family with all his children willing to be back up caregivers and assist with 24/7 for 2 weeks support post hospitalization. He has a reliable phone service and denies any concerns with his home which he rents. He currently has [facility] services and denies any concerns. He reports he is involved with his church community and denies any social clubs at this time. He  competed the 12 th grade and prefers the read when learning. He has been on SSD since 2006 and worked at [employer] as a Geophysicist/field seismologist for 15 years. He states some concerns about finances as both he and his wife are on disability and "live paycheck to paycheck". He has Hum[employer] as his insurance and Medicare D for prescriptions. He denies any history of trauma, physical, sexual or emotional abuse. He states he quit smoking on 04/29/16 after many years and has not had any alcohol for many years. He denies any history of substance abuse. He reports history of depression and anxiety but has never seen a counselor or on any medications.  He scored a 1 on the PHQ-2 and denies any other stressors in his life. He states faith as his coping strategy during challenging times. Patient hopes to have more energy and an improved life as a result of getting the LVAD. Patient and his family appear motivated for implant and hopeful for increased energy and improved quality of life. Family appear dedicated to patient and successful LVAD implant. CSW recommends patient for LVAD implant.   Louann Liv, [place] CCSW-MCS [phone]

## 2017-04-03 NOTE — Addendum Note (Signed)
Encounter addended by: Louann Liv, LCSW on: 04/03/2017 11:27 AM  Actions taken: Sign clinical note

## 2017-04-04 ENCOUNTER — Other Ambulatory Visit (HOSPITAL_COMMUNITY): Payer: Self-pay | Admitting: Internal Medicine

## 2017-04-04 ENCOUNTER — Telehealth: Payer: Self-pay | Admitting: Licensed Clinical Social Worker

## 2017-04-04 NOTE — Telephone Encounter (Signed)
CSW received call from patient's wife stating she received call from [facility] worker that patient still needs additional medical bills to meet the deductible of $10,000. Wife feeling a bit overwhelmed with the process but appreciative of the support from VAD team. CSW will follow up with VAD Coordinators for return call to wife tomorrow. CSW continues to follow for support and VAD work up. Raquel Sarna, [place], [place]

## 2017-04-05 ENCOUNTER — Other Ambulatory Visit (HOSPITAL_COMMUNITY): Payer: Self-pay | Admitting: Internal Medicine

## 2017-04-06 ENCOUNTER — Other Ambulatory Visit: Payer: Self-pay

## 2017-04-06 ENCOUNTER — Encounter: Payer: Self-pay | Admitting: Surgery

## 2017-04-06 ENCOUNTER — Institutional Professional Consult (permissible substitution) (INDEPENDENT_AMBULATORY_CARE_PROVIDER_SITE_OTHER): Payer: Medicare HMO | Admitting: Surgery

## 2017-04-06 ENCOUNTER — Encounter (HOSPITAL_COMMUNITY): Payer: Medicare HMO

## 2017-04-06 VITALS — BP 114/71 | HR 79 | Ht 65.0 in | Wt 155.0 lb

## 2017-04-06 DIAGNOSIS — I255 Ischemic cardiomyopathy: Secondary | ICD-10-CM | POA: Diagnosis not present

## 2017-04-07 ENCOUNTER — Encounter: Payer: Self-pay | Admitting: Surgery

## 2017-04-07 ENCOUNTER — Encounter (HOSPITAL_COMMUNITY): Payer: Self-pay

## 2017-04-07 ENCOUNTER — Telehealth: Payer: Self-pay

## 2017-04-07 NOTE — Progress Notes (Signed)
Cardiothoracic Surgery Consultation  PCP is Raina Mina., MD Referring Provider is Deboraha Sprang, MD  Chief Complaint  Patient presents with  . New Patient (Initial Visit)    LVAD evaluation    HPI:  The patient is a 51 year old gentleman with blood type O+ with hypertension, hyperlipidemia, stage III chronic kidney disease, and coronary artery disease with ischemic cardiomyopathy.  He had an anterior MI in 2009 with PCI of the LAD.    He presented to [facility] in January 2018 with worsening shortness of breath with exertion and orthopnea.  He was admitted with atrial fibrillation and atrial flutter and subsequently had a bradycardia/PEA arrest requiring intubation and pressors.  He was transferred to [facility] and his pressors were weaned.  He was extubated.  An echocardiogram at that time showed an ejection fraction of 15-20%.  Cardiac catheterization on 05/05/2016 showed severe three-vessel coronary disease with a high-grade stenosis of the RCA and LAD.  His PCWP was 37 at that time and he was diuresed for optimization and subsequently he underwent stenting of the proximal to mid RCA and the posterior lateral OM with drug-eluting stents as well as successful stenting of the mid LAD with a drug-eluting stent on 05/09/2016.  He was admitted again in April 2018 with palpitations and weakness and underwent placement of an ICD.  The left ventricular lead could not be placed and therefore he subsequently underwent a small left thoracotomy with insertion of 2 LV epicardial leads and revision of his CRT system to BiV by me on 11/07/2016.  He had a fair amount of pain postoperatively which I followed for a few months.  This gradually completely resolved.  When I last saw him on 02/01/2017 he was still having some shortness of breath with exertion.  He was subsequently admitted after catheterization on 03/10/2017 with low output heart failure.  His cath data showed:  Ao = 95/55 (71) LV =  93/32 RA = 12 RV = 62/17 PA = 60/27 (37) PCW = 22 (v= 35) Fick cardiac output/index = 3.0/1.6 PVR =5.0 WU SVR = 1580 Ao sat = 98% PA sat = 50%, 52% PaPI = 2/75 RA/PCW = 0.54 RA/LVEDP = 0.38   He was started on milrinone but had hypotension and was switched to dobutamine.  He had a tunneled PICC line placed for home dobutamine therapy.  He is on 5 mcg/kg/min and says that he feels much better.  He presently denies any shortness of breath and feels like his energy level has improved.  He was seen by Dr. Haroldine Laws recently and was referred to [facility] for consideration of transplantation.  His wife said that heart transplantation was not going to be financially feasible at this time and she was told by the [facility] transplant team that he would be best proceeding ahead with LVAD implant.  Past Medical History:  Diagnosis Date  . AICD (automatic cardioverter/defibrillator) present   . CAD (coronary artery disease) 2009   AMI in 12/2007 with PCI to LAD, staged PCI to  mid/distal RCA, NSTEMI in 02/2009 with BMS to LCx  . CHF (congestive heart failure) ([place])   . Chronic kidney disease 11/03/2016   stage 3 kidney disease  . HLD (hyperlipidemia)   . HTN (hypertension)   . Ischemic cardiomyopathy    Admitted in 07/2010 with CHF exacerbation  . Myocardial infarction ([place])   . Seizures ([place]) 11/03/2016   one seizure in 04/2016 during cardiac event  No history of GI  bleeding  Past Surgical History:  Procedure Laterality Date  . BIV ICD INSERTION CRT-D N/A 08/01/2016   Procedure: BiV ICD Insertion CRT-D;  Surgeon: Will Meredith Leeds, MD;  Location: [place] [facility];  Service: Cardiovascular;  Laterality: N/A;  . CARDIAC CATHETERIZATION N/A 05/05/2016   Procedure: Right/Left Heart Cath and Coronary Angiography;  Surgeon: Jolaine Artist, MD;  Location: [place] [facility];  Service: Cardiovascular;  Laterality: N/A;  . CARDIAC CATHETERIZATION N/A 05/09/2016   Procedure: Coronary Stent Intervention;   Surgeon: Peter M Martinique, MD;  Location: [place] [facility];  Service: Cardiovascular;  Laterality: N/A;  . CARDIAC CATHETERIZATION N/A 05/09/2016   Procedure: Intravascular Pressure Wire/FFR Study;  Surgeon: Peter M Martinique, MD;  Location: [place] [facility];  Service: Cardiovascular;  Laterality: N/A;  . ELECTROPHYSIOLOGY STUDY N/A 06/24/2016   Procedure: Electrophysiology Study;  Surgeon: Deboraha Sprang, MD;  Location: [place] [facility];  Service: Cardiovascular;  Laterality: N/A;  . EPICARDIAL PACING LEAD PLACEMENT Left 11/07/2016   Procedure: LV EPICARDIAL PACING LEAD PLACEMENT VIA LEFT MINI THORACOTOMY;  Surgeon: Gaye Pollack, MD;  Location: [place];  Service: Thoracic;  Laterality: Left;  . ICD IMPLANT N/A 06/24/2016   Procedure: POSSIBLE  ICD Implant;  Surgeon: Deboraha Sprang, MD;  Location: [place] [facility];  Service: Cardiovascular;  Laterality: N/A;  . IR FLUORO GUIDE CV MIDLINE PICC RIGHT  03/14/2017  . IR US GUIDE VASC ACCESS RIGHT  03/14/2017  . LEFT HEART CATHETERIZATION WITH CORONARY ANGIOGRAM N/A 03/13/2011   Procedure: LEFT HEART CATHETERIZATION WITH CORONARY ANGIOGRAM;  Surgeon: Lorretta Harp, MD;  Location: [facility] CATH LAB;  Service: Cardiovascular;  Laterality: N/A;  . RIGHT/LEFT HEART CATH AND CORONARY ANGIOGRAPHY N/A 03/10/2017   Procedure: RIGHT/LEFT HEART CATH AND CORONARY ANGIOGRAPHY;  Surgeon: Jolaine Artist, MD;  Location: [place] [facility];  Service: Cardiovascular;  Laterality: N/A;    Family History  Problem Relation Age of Onset  . Coronary artery disease Father   . Hypertension Father   . Hypertension Sister   . Coronary artery disease Brother   . Emphysema Brother   . Coronary artery disease Mother   . Hypertension Mother   . Emphysema Mother   . Hypertension Daughter   . Obesity Daughter   . Diabetes Unknown   . Hypertension Unknown   . Coronary artery disease Unknown     Social History Social History   Tobacco Use  . Smoking status: Former  Smoker    Packs/day: 0.20    Years: 30.00    Pack years: 6.00    Last attempt to quit: 04/29/2016    Years since quitting: 0.9  . Smokeless tobacco: Never Used  . Tobacco comment: Used to smoke cigarettes. Pt is currently smoking 3 black & mild cigars per day. He has cut down but never quit.  Substance Use Topics  . Alcohol use: No  . Drug use: No    Current Outpatient Medications  Medication Sig Dispense Refill  . amiodarone (PACERONE) 200 MG tablet Take 200 mg by mouth 2 (two) times daily.    Marland Kitchen apixaban (ELIQUIS) 5 MG TABS tablet Take 1 tablet (5 mg total) by mouth 2 (two) times daily. 60 tablet 1  . aspirin 81 MG chewable tablet Chew 1 tablet (81 mg total) by mouth daily. 30 tablet 6  . atorvastatin (LIPITOR) 40 MG tablet Take 1 tablet (40 mg total) by mouth daily at 6 PM. 30 tablet 6  .  DOBUTamine (DOBUTREX) 4-5 MG/ML-% infusion Inject 352.5 mcg/min into the vein continuous. 250 mL 0  . furosemide (LASIX) 80 MG tablet Take 80 mg by mouth daily.    . ivabradine (CORLANOR) 5 MG TABS tablet Take 0.5 tablets (2.5 mg total) by mouth 2 (two) times daily with a meal. 60 tablet 0  . linaclotide (LINZESS) 290 MCG CAPS capsule Take 290 mcg by mouth daily as needed (constipation).    Marland Kitchen losartan (COZAAR) 25 MG tablet Take 0.5 tablets (12.5 mg total) by mouth daily. 15 tablet 6  . Multiple Vitamins-Minerals (ADULT GUMMY) CHEW Chew 2 tablets by mouth daily.     No current facility-administered medications for this visit.     Allergies  Allergen Reactions  . Plavix [Clopidogrel Bisulfate] Hives    Review of Systems  Constitutional: Positive for activity change. Negative for fatigue and fever.  HENT: Positive for dental problem.   Eyes: Negative.   Respiratory: Positive for cough and shortness of breath.   Cardiovascular: Negative for chest pain, palpitations and leg swelling.  Gastrointestinal: Negative.   Endocrine: Negative.   Genitourinary: Negative.   Musculoskeletal: Negative.     Skin: Negative.   Allergic/Immunologic: Negative.   Neurological: Positive for seizures.  Hematological: Negative.   Psychiatric/Behavioral: The patient is nervous/anxious.     BP 114/71 (BP Location: Right Arm, Patient Position: Sitting, Cuff Size: Normal)   Pulse 79   Ht 5\' 5"  (1.651 m)   Wt 155 lb (70.3 kg)   SpO2 96%   BMI 25.79 kg/m  Physical Exam  Constitutional: He is oriented to person, place, and time. He appears well-developed and well-nourished. No distress.  HENT:  Head: Normocephalic and atraumatic.  Mouth/Throat: Oropharynx is clear and moist.  Teeth in poor condition  Eyes: Conjunctivae are normal. Pupils are equal, round, and reactive to light.  Neck: Normal range of motion. Neck supple. No JVD present. No thyromegaly present.  Cardiovascular: Normal rate, regular rhythm, normal heart sounds and intact distal pulses.  No murmur heard. Pulmonary/Chest: Effort normal and breath sounds normal. No respiratory distress. He has no rales.  Left thoracotomy scar  Abdominal: Soft. Bowel sounds are normal.  Musculoskeletal: Normal range of motion. He exhibits no edema.  Lymphadenopathy:    He has no cervical adenopathy.  Neurological: He is alert and oriented to person, place, and time. He has normal strength. No cranial nerve deficit or sensory deficit.  Skin: Skin is warm and dry.  Psychiatric: He has a normal mood and affect.     Diagnostic Tests:  Physicians   Panel Physicians Referring Physician Case Authorizing Physician  Bensimhon, Shaune Pascal, MD (Primary)    Procedures   RIGHT/LEFT HEART CATH AND CORONARY ANGIOGRAPHY  Conclusion     Dist LAD lesion is 80% stenosed.  Ost Cx to Mid Cx lesion is 40% stenosed.  Dist RCA lesion is 40% stenosed.  Previously placed Prox RCA to Mid RCA stent (unknown type) is widely patent.  Mid RCA lesion is 40% stenosed.  Previously placed Post Atrio stent (unknown type) is widely patent.  Acute Mrg lesion is 99%  stenosed.  Previously placed Prox LAD to Dist LAD stent (unknown type) is widely patent.  Prox LAD lesion is 40% stenosed.   Findings:  Ao = 95/55 (71) LV = 93/32 RA = 12 RV = 62/17 PA = 60/27 (37) PCW = 22 (v= 35) Fick cardiac output/index = 3.0/1.6 PVR =5.0 WU SVR = 1580 Ao sat = 98% PA sat =  50%, 52% PaPI = 2/75 RA/PCW = 0.54 RA/LVEDP = 0.38  Assessment: 1. Patent LAD and RCA stents. Non obstructive CAD elsehwere 2. Volume overload with low-output physiology  Plan/Discussion:  Admit for IV diuresis with milrinone support and start work-up for advanced therapies  Glori Bickers, MD  9:16 AM     Indications   Acute on chronic systolic (congestive) heart failure (HCC) [I50.23 (ICD-10-CM)]  Procedural Details/Technique   Technical Details The risks and indication of the procedure were explained. Consent was signed and placed on the chart. An appropriate timeout was taken prior to the procedure.   The right AC fossa was prepped and draped in the routine sterile fashion and anesthetized with 1% local lidocaine. We were unable to wire the pre-existing PIV in the right AC. So using u/s guidance we identified the brachial vein and cannulated with a micro-puncture set. The micropuncture sheath was then exchanged over a wire for a 5 FR venous sheath using a modified Seldinger technique. A standard Swan-Ganz catheter was used for the procedure.   After a normal Allen's test was confirmed, the right wrist was prepped and draped in the routine sterile fashion and anesthetized with 1% local lidocaine. A 5 FR arterial sheath was then placed in the right radial artery using a modified Seldinger technique. 3mg  IV verapamil was given through the sheath. Systemic heparin was administered. Standard catheters including a JL 3.5 and a JR 4 were used. All catheter exchanges were made over a wire.   Estimated blood loss <50 mL.  During this procedure the patient was administered  the following to achieve and maintain moderate conscious sedation: Versed 1 mg, Fentanyl 25 mcg, while the patient's heart rate, blood pressure, and oxygen saturation were continuously monitored. The period of conscious sedation was 42 minutes, of which I was present face-to-face 100% of this time.  Coronary Findings   Diagnostic  Dominance: Right  Left Anterior Descending  Prox LAD lesion 40% stenosed  Prox LAD lesion is 40% stenosed.  Prox LAD to Dist LAD lesion 0% stenosed  Previously placed Prox LAD to Dist LAD stent (unknown type) is widely patent.  Dist LAD lesion 80% stenosed  Dist LAD lesion is 80% stenosed.  Left Circumflex  Ost Cx to Mid Cx lesion 40% stenosed  Ost Cx to Mid Cx lesion is 40% stenosed.  Right Coronary Artery  Prox RCA to Mid RCA lesion 0% stenosed  Previously placed Prox RCA to Mid RCA stent (unknown type) is widely patent.  Mid RCA lesion 40% stenosed  Mid RCA lesion is 40% stenosed.  Dist RCA lesion 40% stenosed  Dist RCA lesion is 40% stenosed. The lesion was previously treated.  Acute Marginal Branch  Acute Mrg lesion 99% stenosed  Acute Mrg lesion is 99% stenosed.  Right Posterior Atrioventricular Branch  Post Atrio lesion 0% stenosed  Previously placed Post Atrio stent (unknown type) is widely patent.  Intervention   No interventions have been documented.  Coronary Diagrams   Diagnostic Diagram       Implants     No implant documentation for this case.  MERGE Images   Show images for CARDIAC CATHETERIZATION   Link to Procedure Log   Procedure Log    Hemo Data    Most Recent Value  Fick Cardiac Output 2.99 L/min  Fick Cardiac Output Index 1.65 (L/min)/BSA  Aortic Mean Gradient 2.7 mmHg  Aortic Peak Gradient 1 mmHg  Aortic Valve Area >3.50  Aortic Value Area Index 1.93 cm2/BSA  RA A Wave 16 mmHg  RA V Wave 13 mmHg  RA Mean 12 mmHg  RV Systolic Pressure 62 mmHg  RV Diastolic Pressure 4 mmHg  RV EDP 17 mmHg  PA Systolic  Pressure 60 mmHg  PA Diastolic Pressure 27 mmHg  PA Mean 37 mmHg  PW A Wave 29 mmHg  PW V Wave 35 mmHg  PW Mean 22 mmHg  AO Systolic Pressure 92 mmHg  AO Diastolic Pressure 60 mmHg  AO Mean 73 mmHg  LV Systolic Pressure 93 mmHg  LV Diastolic Pressure 19 mmHg  LV EDP 32 mmHg  Arterial Occlusion Pressure Extended Systolic Pressure 11 mmHg  Arterial Occlusion Pressure Extended Diastolic Pressure 0 mmHg  Arterial Occlusion Pressure Extended Mean Pressure 2 mmHg  Left Ventricular Apex Extended Systolic Pressure 87 mmHg  Left Ventricular Apex Extended Diastolic Pressure 21 mmHg  Left Ventricular Apex Extended EDP Pressure 33 mmHg  QP/QS 1  TPVR Index 22.4 HRUI  TSVR Index 43 HRUI  PVR SVR Ratio 0.25  TPVR/TSVR Ratio 0.52                               *[place]*                   *[facility]*                         [address], [place] [address]                            [phone]  ------------------------------------------------------------------- Transthoracic Echocardiography  Patient:    Marcelo, Ickes MR #:       [id number] Study Date: 03/11/2017 Gender:     M Age:        88 Height:     165.1 cm Weight:     70.5 kg BSA:        1.81 m^2 Pt. Status: Room:       2C12C   ADMITTING    Bensimhon, Daniel  ATTENDING    Bensimhon, Lanae Crumbly     Bensimhon, Glynis Smiles    Bensimhon, Daniel  PERFORMING   Chmg, Inpatient  SONOGRAPHER  Dance, Tiffany  cc:  ------------------------------------------------------------------- LV EF: 20% -   25%  ------------------------------------------------------------------- Indications:      CHF - 428.0.  ------------------------------------------------------------------- History:   PMH:   Coronary artery disease.  Congestive heart failure.  PMH:  Seizures. Chronic Kidney Disease. Defibrillator. Ischemic Cardiomyopathy.  Myocardial infarction.  Risk  factors: Hypertension. Dyslipidemia.  ------------------------------------------------------------------- Study Conclusions  - Left ventricle: The cavity size was moderately dilated. Wall   thickness was normal. Systolic function was severely reduced. The   estimated ejection fraction was in the range of 20% to 25%.   Severe diffuse hypokinesis with distinct regional wall motion   abnormalities. Akinesis and scarring of the anterolateral and   inferolateral myocardium; consistent with infarction in the   distribution of the left circumflex coronary artery. No evidence   of thrombus. - Ventricular septum: Septal motion showed paradox. These changes   are consistent with right ventricular pacing. - Aortic valve: There was mild regurgitation. - Mitral valve: There was mild to moderate regurgitation directed  centrally. - Left atrium: The atrium was severely dilated. - Right ventricle: Systolic function was mildly reduced. - Pulmonary arteries: Systolic pressure was moderately increased.   PA peak pressure: 56 mm Hg (S).  ------------------------------------------------------------------- Study data:  Comparison was made to the study of 07/29/2016.  Study status:  Routine.  Procedure:  The patient reported no pain pre or post test. Transthoracic echocardiography. Image quality was adequate.  Study completion:  There were no complications. Transthoracic echocardiography.  M-mode, complete 2D, spectral Doppler, and color Doppler.  Birthdate:  Patient birthdate: 19-Apr-1966.  Age:  Patient is 51 yr old.  Sex:  Gender: male. BMI: 25.9 kg/m^2.  Blood pressure:     107/72  Patient status: Inpatient.  Study date:  Study date: 03/11/2017. Study time: 09:32 AM.  Location:  Bedside.  -------------------------------------------------------------------  ------------------------------------------------------------------- Left ventricle:  The cavity size was moderately dilated.  Wall thickness was normal. Systolic function was severely reduced. The estimated ejection fraction was in the range of 20% to 25%.  Severe diffuse hypokinesis with distinct regional wall motion abnormalities.  No evidence of thrombus.  Regional wall motion abnormalities:   Akinesis and scarring of the anterolateral and inferolateral myocardium; consistent with infarction in the distribution of the left circumflex coronary artery. The study was not technically sufficient to allow evaluation of LV diastolic dysfunction due to atrial fibrillation.  ------------------------------------------------------------------- Aortic valve:   Structurally normal valve. Trileaflet. Cusp separation was normal.  Doppler:  Transvalvular velocity was within the normal range. There was no stenosis. There was mild regurgitation.  ------------------------------------------------------------------- Aorta:  Aortic root: The aortic root was normal in size. Ascending aorta: The ascending aorta was normal in size.  ------------------------------------------------------------------- Mitral valve:   Structurally normal valve.   Leaflet separation was normal.  Doppler:  Transvalvular velocity was within the normal range. There was no evidence for stenosis. There was mild to moderate regurgitation directed centrally.    Valve area by pressure half-time: 5.12 cm^2. Indexed valve area by pressure half-time: 2.83 cm^2/m^2.    Peak gradient (D): 6 mm Hg.  ------------------------------------------------------------------- Left atrium:  The atrium was severely dilated.  ------------------------------------------------------------------- Right ventricle:  The cavity size was normal. Pacer wire or catheter noted in right ventricle. Systolic function was mildly reduced.  ------------------------------------------------------------------- Ventricular septum:   Septal motion showed paradox. These changes are  consistent with right ventricular pacing.  ------------------------------------------------------------------- Pulmonic valve:    Structurally normal valve.   Cusp separation was normal.  Doppler:  Transvalvular velocity was within the normal range. There was mild regurgitation.  ------------------------------------------------------------------- Tricuspid valve:   Structurally normal valve.   Leaflet separation was normal.  Doppler:  Transvalvular velocity was within the normal range. There was mild regurgitation.  ------------------------------------------------------------------- Pulmonary artery:   Systolic pressure was moderately increased.  ------------------------------------------------------------------- Right atrium:  The atrium was normal in size. Pacer wire or catheter noted in right atrium.  ------------------------------------------------------------------- Pericardium:  There was no pericardial effusion.  ------------------------------------------------------------------- Systemic veins: Inferior vena cava: The vessel was dilated. The respirophasic diameter changes were blunted (< 50%), consistent with elevated central venous pressure.  ------------------------------------------------------------------- Measurements   Left ventricle                           Value          Reference  LV ID, ED, PLAX chordal          (H)     67.7  mm  43 - 52  LV ID, ES, PLAX chordal          (H)     62.74 mm       23 - 38  LV fx shortening, PLAX chordal   (L)     7     %        >=29  LV PW thickness, ED                      10    mm       ----------  IVS/LV PW ratio, ED                      0.9            <=1.3    Ventricular septum                       Value          Reference  IVS thickness, ED                        9     mm       ----------    LVOT                                     Value          Reference  LVOT ID, S                               20    mm        ----------  LVOT area                                3.14  cm^2     ----------    Aortic valve                             Value          Reference  Aortic regurg pressure half-time         309   ms       ----------    Aorta                                    Value          Reference  Aortic root ID, ED                       32    mm       ----------  Ascending aorta ID, A-P, S               29    mm       ----------    Left atrium                              Value          Reference  LA ID, A-P, ES  54    mm       ----------  LA ID/bsa, A-P                   (H)     2.98  cm/m^2   <=2.2  LA volume, S                             106   ml       ----------  LA volume/bsa, S                         58.5  ml/m^2   ----------  LA volume, ES, 1-p A4C                   109   ml       ----------  LA volume/bsa, ES, 1-p A4C               60.2  ml/m^2   ----------  LA volume, ES, 1-p A2C                   101   ml       ----------  LA volume/bsa, ES, 1-p A2C               55.8  ml/m^2   ----------    Mitral valve                             Value          Reference  Mitral E-wave peak velocity              122   cm/s     ----------  Mitral deceleration time                 151   ms       150 - 230  Mitral pressure half-time                44    ms       ----------  Mitral peak gradient, D                  6     mm Hg    ----------  Mitral valve area, PHT, DP               5.12  cm^2     ----------  Mitral valve area/bsa, PHT, DP           2.83  cm^2/m^2 ----------  Mitral maximal regurg velocity,          360   cm/s     ----------  PISA  Mitral regurg VTI, PISA                  111   cm       ----------  Mitral ERO, PISA                         0.17  cm^2     ----------  Mitral regurg volume, PISA               19    ml       ----------    Pulmonary arteries  Value          Reference  PA pressure, S, DP               (H)     56    mm Hg     <=30    Tricuspid valve                          Value          Reference  Tricuspid regurg peak velocity           320   cm/s     ----------  Tricuspid peak RV-RA gradient            41    mm Hg    ----------    Right atrium                             Value          Reference  RA ID, S-I, ES, A4C              (H)     49.9  mm       34 - 49  RA area, ES, A4C                         18.4  cm^2     8.3 - 19.5  RA volume, ES, A/L                       54.2  ml       ----------  RA volume/bsa, ES, A/L                   29.9  ml/m^2   ----------    Systemic veins                           Value          Reference  Estimated CVP                            15    mm Hg    ----------    Right ventricle                          Value          Reference  RV ID, minor axis, ED, A4C base          34    mm       ----------  RV ID, minor axis, ED, A4C mid           21    mm       ----------  RV ID, major axis, ED, A4C       (H)     98    mm       55 - 91  TAPSE                                    17.3  mm       ----------  RV pressure, S, DP               (  H)     56    mm Hg    <=30    Pulmonic valve                           Value          Reference  Pulmonic regurg velocity, ED             148   cm/s     ----------  Pulmonic regurg gradient, ED             9     mm Hg    ----------  Legend: (L)  and  (H)  mark values outside specified reference range.  ------------------------------------------------------------------- Prepared and Electronically Authenticated by  Sanda Klein, MD 2018-11-17T14:53:08  CLINICAL DATA:  Heart failure.  EXAM: CT CHEST, ABDOMEN AND PELVIS WITHOUT CONTRAST  TECHNIQUE: Multidetector CT imaging of the chest, abdomen and pelvis was performed following the standard protocol without IV contrast.  COMPARISON:  CT scan of April 29, 2016.  FINDINGS: CT CHEST FINDINGS  Cardiovascular: Atherosclerosis of thoracic aorta is noted without aneurysm  formation. Mild cardiomegaly is noted. No pericardial effusion is noted. Left-sided pacemaker is in grossly good position. Right-sided PICC line is noted with distal tip at cavoatrial junction. Coronary artery stents are noted.  Mediastinum/Nodes: 2 cm right peritracheal lymph node is noted which is not significantly changed. Stable mildly enlarged anterior mediastinal lymph nodes are noted. Thyroid gland, trachea, and esophagus demonstrate no significant findings.  Lungs/Pleura: No pneumothorax is noted. Mild emphysematous disease is noted in the upper lobes bilaterally. Mild left pleural effusion is noted with adjacent subsegmental atelectasis. Stable 4 mm nodule is noted in right upper lobe best seen on image number 63 of series 5.  Musculoskeletal: No chest wall mass or suspicious bone lesions identified.  CT ABDOMEN PELVIS FINDINGS  Hepatobiliary: No focal liver abnormality is seen. No gallstones, gallbladder wall thickening, or biliary dilatation.  Pancreas: Unremarkable. No pancreatic ductal dilatation or surrounding inflammatory changes.  Spleen: Normal in size without focal abnormality.  Adrenals/Urinary Tract: Adrenal glands are unremarkable. Kidneys are normal, without renal calculi, focal lesion, or hydronephrosis. Bladder is unremarkable.  Stomach/Bowel: Stomach is within normal limits. Appendix appears normal. No evidence of bowel wall thickening, distention, or inflammatory changes.  Vascular/Lymphatic: Aortic atherosclerosis. No enlarged abdominal or pelvic lymph nodes.  Reproductive: Prostate is unremarkable.  Other: No abdominal wall hernia or abnormality. No abdominopelvic ascites.  Musculoskeletal: No acute or significant osseous findings.  IMPRESSION: Mild cardiomegaly.  Atherosclerosis of thoracic and abdominal aorta is noted without aneurysm formation.  Grossly stable mediastinal adenopathy is noted of  indeterminate etiology.  Stable 4 mm nodule noted in right upper lobe.  Mild emphysematous disease is noted in both upper lobes.  No acute abnormality seen in the abdomen or pelvis.   Electronically Signed   By: Marijo Conception, M.D.   On: 03/13/2017 14:45    Ref Range & Units 3wk ago  FVC-Pre L 2.90   FVC-%Pred-Pre % 84   FEV1-Pre L 1.90   FEV1-%Pred-Pre % 70   FEV6-Pre L 2.87   FEV6-%Pred-Pre % 86   Pre FEV1/FVC ratio % 66   FEV1FVC-%Pred-Pre % 81   Pre FEV6/FVC Ratio % 99   FEV6FVC-%Pred-Pre % 103   FEF 25-75 Pre L/sec 1.19   FEF2575-%Pred-Pre % 43   DLCO unc ml/min/mmHg 10.30   DLCO unc % pred % 40   DLCO cor  ml/min/mmHg 11.31   DLCO cor % pred % 44   DL/VA ml/min/mmHg/L 2.92   DL/VA % pred % 67     Impression:  This 51 year old gentleman has ischemic cardiomyopathy with an ejection fraction of 15-20% and was placed on dobutamine for low output heart failure with New York Heart Association class IV symptoms.  He is symptomatically much better on dobutamine.  He has been referred to [facility] for consideration of heart transplantation but for financial reasons and his blood type O+ it is felt that proceeding with LVAD support is the best option.  His last echocardiogram was done prior to dobutamine and showed mild right ventricular dysfunction.  His last right heart cath was also done prior to dobutamine and showed markedly elevated pulmonary artery pressures and low cardiac index. His RA/PCW at that time was 0.54. PAPI was 2.75.  He is scheduled for extraction of his remaining teeth next Wednesday by Dr. Enrique Sack.  His Eliquis will be stopped for that procedure.  He would like to proceed with LVAD implant the following week if possible.  I discussed the operative procedure of a HeartMate 3 implantation but the patient and his wife.  We discussed alternatives including heart transplantation and palliative medical therapy.  We discussed the benefits and risks of surgery  including but not limited to bleeding, blood transfusion, infection, stroke, persistent right ventricular dysfunction possibly requiring RVAD support or prolonged intravenous inotropic support, postoperative organ dysfunction including renal failure and dialysis, pump thrombosis or malfunction, and death.  All of their questions have been answered.  He appears to have a good support system at home with a dedicated wife and other family members who are willing to help him.  He has continued to abstain from smoking which should help him with his pulmonary function testing showing a moderate to severe diffusion defect.   Plan:  Implantation of HeartMate 3 LVAD in the next couple weeks if possible after extraction of his remaining bad teeth.  I spent 60 minutes performing this consultation and > 50% of this time was spent face to face counseling and coordinating the care of this patient's ischemic cardiomyopathy and chronic systolic heart failure.   Gaye Pollack, MD [facility] [phone]

## 2017-04-07 NOTE — Pre-Procedure Instructions (Signed)
David [name]  04/07/2017      [facility] [place], [place] [address] [place] [address] Phone: [phone] Fax: [phone]    Your procedure is scheduled on Wednesday December 19.  Report to [facility] Admitting at 6:30 A.M.  Call this number if you have problems the morning of surgery:  [phone]   Remember:  Do not eat food or drink liquids after midnight.  Take these medicines the morning of surgery with A SIP OF WATER: Amiodarone (pacerone)   STOP taking any Aleve, Naproxen, Ibuprofen, Motrin, Advil, Goody's, BC's, all herbal medications, fish oil, and all vitamins  **FOLLOW Surgeon's instructions on when to stop taking Aspirin and Eliquis. If no instructions were given, please call surgeon's office. **     Do not wear jewelry, make-up or nail polish.  Do not wear lotions, powders, or perfumes, or deodorant.  Do not shave 48 hours prior to surgery.  Men may shave face and neck.  Do not bring valuables to the hospital.  [facility] is not responsible for any belongings or valuables.  Contacts, dentures or bridgework may not be worn into surgery.  Leave your suitcase in the car.  After surgery it may be brought to your room.  For patients admitted to the hospital, discharge time will be determined by your treatment team.  Patients discharged the day of surgery will not be allowed to drive home.   Special instructions:    [facility]- Preparing For Surgery  Before surgery, you can play an important role. Because skin is not sterile, your skin needs to be as free of germs as possible. You can reduce the number of germs on your skin by washing with CHG (chlorahexidine gluconate) Soap before surgery.  CHG is an antiseptic cleaner which kills germs and bonds with the skin to continue killing germs even after washing.  Please do not use if you have an allergy to CHG or antibacterial soaps. If your skin becomes  reddened/irritated stop using the CHG.  Do not shave (including legs and underarms) for at least 48 hours prior to first CHG shower. It is OK to shave your face.  Please follow these instructions carefully.   1. Shower the NIGHT BEFORE SURGERY and the MORNING OF SURGERY with CHG.   2. If you chose to wash your hair, wash your hair first as usual with your normal shampoo.  3. After you shampoo, rinse your hair and body thoroughly to remove the shampoo.  4. Use CHG as you would any other liquid soap. You can apply CHG directly to the skin and wash gently with a scrungie or a clean washcloth.   5. Apply the CHG Soap to your body ONLY FROM THE NECK DOWN.  Do not use on open wounds or open sores. Avoid contact with your eyes, ears, mouth and genitals (private parts). Wash Face and genitals (private parts)  with your normal soap.  6. Wash thoroughly, paying special attention to the area where your surgery will be performed.  7. Thoroughly rinse your body with warm water from the neck down.  8. DO NOT shower/wash with your normal soap after using and rinsing off the CHG Soap.  9. Pat yourself dry with a CLEAN TOWEL.  10. Wear CLEAN PAJAMAS to bed the night before surgery, wear comfortable clothes the morning of surgery  11. Place CLEAN SHEETS on your bed the night of your first shower  and DO NOT SLEEP WITH PETS.    Day of Surgery: Do not apply any deodorants/lotions. Please wear clean clothes to the hospital/surgery center.      Please read over the following fact sheets that you were given. Coughing and Deep Breathing and Surgical Site Infection Prevention

## 2017-04-07 NOTE — Progress Notes (Addendum)
Anesthesia PAT Evaluation: Patient is a 51 year old male scheduled for multiple teeth extractions (extract all remaining teeth) with alveoloplasty on 04/12/17 (8:30 AM) by Dr. Teena Dunk. Procedure is part of a medically necessary pre-LVAD placement. He was instructed to hold Eliquis for 2 days prior to surgery. He tells me that Dr. Gilford Raid is looking at a 04/2017 date for LVAD placement.   Case is posted for general anesthesia. Discussed would anticipate ETT, oral versus nasal. His last Eliquis does was 04/08/17. He denied gross epistaxis, although occasional blood tinged nasal discharge related to allergies. He did have nasal bleeding and swelling ~ 3 years ago when a parrot clipped his ear at a friend's house and in trying to move away from the bird, he hit his nose on a counter and had bleeding and swelling. He did not have xrays afterwards.  History includes former smoker (quit 04/29/16), CAD/MI (anterior STEMI s/p DES to mid and proximal LAD 01/20/08 and s/p DES mid and distal RCA 01/22/08; BMS CX in the setting of cardiogenic shock 03/06/09; inferior MI s/p BMS mid RCA 03/13/11; PEA arrest 04/29/16 s/p DES mid RCA and PLOM and DES mid LAD 05/09/16), ischemic cardiomyopathy, chronic systolic CHF, cardiac arrest with seizure 04/2016 [facility]), [employer] ICD 08/01/2016 (unable to get LV lead placed, s/p LV epicardial lead placement vis left mini thoracotomy 11/07/16 for CRT-D), PAF, frequent PVCs, HTN, HLD, CKD stage III.  - Admitted 02/1617 - 03/14/17 with low output HF. Started on milrinone, but failed due to hypotension, so switched to dobutamine. Tunneled central venous catheter via right IJ vein placed 03/14/17. HF meds adjusted as tolerated. Work up for advanced therapies started. VAD coordinator saw, and HFSW set up meeting. Sent home on dobutamine 5 mcg/kg/min via [facility].  - PCP is Dr. Gilford Rile. - HF cardiologist is Dr. Glori Bickers. Last visit 03/23/17. He was referred to [facility] for  initial heart transplant evaluation. He saw Dr. Melvyn Novas on 03/28/17. He felt patient could go through outpatient transplant evaluation. No changes were made to medical therapy as patient felt relatively stable on his inotropes; however, if he decompensates then would need to undergo LVAD and reevaluate for transplant afterwards.  - Primary cardiologist is Dr. Jyl Heinz (although patient reports now just primarily being seen at [facility]).  - EP cardiologist is Dr. Virl Axe. He had RN device check on 04/10/17. Patient bi-ventricular pacing 98.9% of the time.  - CT surgeon is Dr. Gilford Raid.  Meds include amiodarone 200 mg BID, Eliquis 5 mg BID (last dose 04/08/17), aspirin 81 mg daily, Lipitor 40 mg Q PM, dobutamine 352.5 mcg/min, Lasix 80 mg daily, ivabradine 5 mg 1/2 tablet BID, losartan 25 mg 1/2 tablet daily, Linzess 290 mcg daily PRN, MVI.  BP (!) 114/59   Pulse 76   Temp 36.8 C   Resp 20   Ht '5\' 5"'$  (1.651 m)   Wt 155 lb 14.4 oz (70.7 kg)   SpO2 97%   BMI 25.94 kg/m  Patient presents with his wife. He appears well. No conversational dyspnea. Very engaging. Heart RRR, no murmur noted. Lungs clear. No ankle edema. Mallampati I. He says he is feeling "great." No chest pain, no progressive SOB. No edema. Weights have been stable. ICD is located on left chest wall. He has a tunneled right IJ CVL.  EKG 03/13/17: Atrial sensed ventricular paced rhythm.  Echo 03/11/17: Study Conclusions - Left ventricle: The cavity size was moderately dilated. Wall  thickness was normal. Systolic function was severely reduced. The   estimated ejection fraction was in the range of 20% to 25%.   Severe diffuse hypokinesis with distinct regional wall motion   abnormalities. Akinesis and scarring of the anterolateral and   inferolateral myocardium; consistent with infarction in the   distribution of the left circumflex coronary artery. No evidence   of thrombus. - Ventricular septum: Septal  motion showed paradox. These changes   are consistent with right ventricular pacing. - Aortic valve: There was mild regurgitation. - Mitral valve: There was mild to moderate regurgitation directed   centrally. - Left atrium: The atrium was severely dilated. - Right ventricle: Systolic function was mildly reduced. - Pulmonary arteries: Systolic pressure was moderately increased.   PA peak pressure: 56 mm Hg (S).  RHC/LHC 03/10/17:  Dist LAD lesion is 80% stenosed.  Ost Cx to Mid Cx lesion is 40% stenosed.  Dist RCA lesion is 40% stenosed.  Previously placed Prox RCA to Mid RCA stent (unknown type) is widely patent.  Mid RCA lesion is 40% stenosed.  Previously placed Post Atrio stent (unknown type) is widely patent.  Acute Mrg lesion is 99% stenosed.  Previously placed Prox LAD to Dist LAD stent (unknown type) is widely patent.  Prox LAD lesion is 40% stenosed.  Findings: Ao = 95/55 (71) LV = 93/32 RA = 12 RV = 62/17 PA = 60/27 (37) PCW = 22 (v= 35) Fick cardiac output/index = 3.0/1.6 PVR =5.0 WU SVR = 1580 Ao sat = 98% PA sat = 50%, 52% PaPI = 2/75 RA/PCW = 0.54 RA/LVEDP = 0.38 Assessment: 1. Patent LAD and RCA stents. Non obstructive CAD elsehwere 2. Volume overload with low-output physiology Plan/Discussion: Admit for IV diuresis with milrinone support and start work-up for advanced therapies  EP Study 06/24/16: IMPRESSION: 1. Normal sinus function. 2. Normal atrial function. 3. Normal atrioventricular nodal function. 4. Normal His-Purkinje system function. 5. No accessory pathway. 6. Ventricular stimulation resulted in ventricular fibrillation that was self terminating after about 10 seconds and ventricular flutter that required cardioversion/defibrillation. SUMMARY: These are nonspecific findings with triple ventricular extrastimuli and as such, this EP study is considered negative. We will continue him on medical therapy for his ischemic  cardiomyopathy. Given his complex and frequent ventricular ectopy, we will begin him on antiarrhythmic therapy. He has a history of a reaction to amiodarone, which is unlikely to be causal. However, given the concerns, we will attempt to use with mexiletine and ranolazine if necessary for ventricular ectopy suppression. Following suppression of ectopy and 3 months of guideline directed medical therapy, if ejection fraction remains poor, ICD implantation will be considered. (S/P [employer] ICD 08/01/16.)  Carotid U/S 03/14/17: Final Interpretation: - Right Carotid: There is evidence in the right ICA of a 1-39% stenosis. - Left Carotid: There is evidence in the left ICA of a 1-39% stenosis. The ECA appears >50% stenosed. - Vertebrals: Both vertebral arteries were patent with antegrade flow.  CT chest/abd/pelvis 03/13/17: IMPRESSION: 1. Mild cardiomegaly. 2. Atherosclerosis of thoracic and abdominal aorta is noted without aneurysm formation. 3. Grossly stable mediastinal adenopathy is noted of indeterminate etiology. 4. Stable 4 mm nodule noted in right upper lobe. 5. Mild emphysematous disease is noted in both upper lobes. 6. No acute abnormality seen in the abdomen or pelvis.  PFTs 03/14/17  FVC 2.90 (84%) FEV1 1.90 (70%) DLCO unc 10.3 (40%) DLCO corrected 11.3 (44%)  BMET on 03/31/17 (fax received from [facility]) showed glucose 83, BUN  15, Cr 1.35, eGFR 69, Na 143, K 4.2, Cl 103, CO2 27, Ca 9.4.   Discussed day of surgery medication instructions and labs orders with anesthesiologist Dr. Myrtie Soman on 04/10/17. CBC on 03/28/17 showed WBC 10.3, H/H 11.2/36.5, PLT 367. He needs PT/INR on the day of surgery, so will get repeat CBC then (since previous will be 80 days old; patient denied any obvious blood loss).   [facility] Short Stay Center/Anesthesiology Phone [phone] 04/11/2017 9:43 AM

## 2017-04-07 NOTE — Telephone Encounter (Signed)
Spoke with pt's wife informed her that pt needed to have his device checked in office prior to surgery on 12/19. Pts wife reluctantly agreed to apt on 12/19 at 2:00pm. ( pt has no show/cancel last Phys Defib checks, device has not been checked in office since LV epicardial lead placed 10/2016)

## 2017-04-10 ENCOUNTER — Encounter (HOSPITAL_COMMUNITY): Payer: Self-pay

## 2017-04-10 ENCOUNTER — Ambulatory Visit (INDEPENDENT_AMBULATORY_CARE_PROVIDER_SITE_OTHER): Payer: Medicare HMO | Admitting: *Deleted

## 2017-04-10 ENCOUNTER — Encounter (HOSPITAL_COMMUNITY)
Admission: RE | Admit: 2017-04-10 | Discharge: 2017-04-10 | Disposition: A | Discharge status: Home / Self Care | Payer: Medicare HMO | Source: Ambulatory Visit | Attending: Dentistry | Admitting: Dentistry

## 2017-04-10 ENCOUNTER — Other Ambulatory Visit: Payer: Self-pay

## 2017-04-10 DIAGNOSIS — K0889 Other specified disorders of teeth and supporting structures: Secondary | ICD-10-CM | POA: Diagnosis not present

## 2017-04-10 DIAGNOSIS — I255 Ischemic cardiomyopathy: Secondary | ICD-10-CM

## 2017-04-10 DIAGNOSIS — I472 Ventricular tachycardia, unspecified: Secondary | ICD-10-CM

## 2017-04-10 DIAGNOSIS — J449 Chronic obstructive pulmonary disease, unspecified: Secondary | ICD-10-CM | POA: Diagnosis not present

## 2017-04-10 DIAGNOSIS — K029 Dental caries, unspecified: Secondary | ICD-10-CM | POA: Diagnosis not present

## 2017-04-10 DIAGNOSIS — I2510 Atherosclerotic heart disease of native coronary artery without angina pectoris: Secondary | ICD-10-CM | POA: Diagnosis not present

## 2017-04-10 DIAGNOSIS — Z7901 Long term (current) use of anticoagulants: Secondary | ICD-10-CM | POA: Diagnosis not present

## 2017-04-10 DIAGNOSIS — M278 Other specified diseases of jaws: Secondary | ICD-10-CM | POA: Diagnosis not present

## 2017-04-10 DIAGNOSIS — F17290 Nicotine dependence, other tobacco product, uncomplicated: Secondary | ICD-10-CM | POA: Diagnosis not present

## 2017-04-10 DIAGNOSIS — I252 Old myocardial infarction: Secondary | ICD-10-CM | POA: Diagnosis not present

## 2017-04-10 DIAGNOSIS — N183 Chronic kidney disease, stage 3 (moderate): Secondary | ICD-10-CM | POA: Diagnosis not present

## 2017-04-10 DIAGNOSIS — K083 Retained dental root: Secondary | ICD-10-CM | POA: Diagnosis not present

## 2017-04-10 DIAGNOSIS — K045 Chronic apical periodontitis: Secondary | ICD-10-CM | POA: Diagnosis not present

## 2017-04-10 DIAGNOSIS — I480 Paroxysmal atrial fibrillation: Secondary | ICD-10-CM | POA: Diagnosis not present

## 2017-04-10 DIAGNOSIS — E1122 Type 2 diabetes mellitus with diabetic chronic kidney disease: Secondary | ICD-10-CM | POA: Diagnosis not present

## 2017-04-10 DIAGNOSIS — R569 Unspecified convulsions: Secondary | ICD-10-CM | POA: Diagnosis not present

## 2017-04-10 DIAGNOSIS — I5022 Chronic systolic (congestive) heart failure: Secondary | ICD-10-CM | POA: Diagnosis not present

## 2017-04-10 DIAGNOSIS — E785 Hyperlipidemia, unspecified: Secondary | ICD-10-CM | POA: Diagnosis not present

## 2017-04-10 DIAGNOSIS — I130 Hypertensive heart and chronic kidney disease with heart failure and stage 1 through stage 4 chronic kidney disease, or unspecified chronic kidney disease: Secondary | ICD-10-CM | POA: Diagnosis not present

## 2017-04-10 DIAGNOSIS — Z95810 Presence of automatic (implantable) cardiac defibrillator: Secondary | ICD-10-CM | POA: Diagnosis not present

## 2017-04-10 DIAGNOSIS — I447 Left bundle-branch block, unspecified: Secondary | ICD-10-CM | POA: Diagnosis not present

## 2017-04-10 DIAGNOSIS — E876 Hypokalemia: Secondary | ICD-10-CM | POA: Diagnosis not present

## 2017-04-10 DIAGNOSIS — Z955 Presence of coronary angioplasty implant and graft: Secondary | ICD-10-CM | POA: Diagnosis not present

## 2017-04-10 DIAGNOSIS — Z79899 Other long term (current) drug therapy: Secondary | ICD-10-CM | POA: Diagnosis not present

## 2017-04-10 DIAGNOSIS — Z888 Allergy status to other drugs, medicaments and biological substances status: Secondary | ICD-10-CM | POA: Diagnosis not present

## 2017-04-10 HISTORY — DX: Cardiac arrhythmia, unspecified: I49.9

## 2017-04-10 HISTORY — DX: Anemia, unspecified: D64.9

## 2017-04-10 LAB — CUP PACEART INCLINIC DEVICE CHECK
Battery Voltage: 2.96 V
Brady Statistic AS VP Percent: 99.01 %
Brady Statistic AS VS Percent: 0.8 %
Brady Statistic RA Percent Paced: 0.19 %
HIGH POWER IMPEDANCE MEASURED VALUE: 64 Ohm
Implantable Lead Implant Date: 20180409
Implantable Lead Implant Date: 20180409
Implantable Lead Location: 753858
Implantable Lead Location: 753860
Implantable Lead Model: 5076
Implantable Lead Model: 511212
Implantable Lead Model: 511212
Implantable Lead Serial Number: 263773
Implantable Pulse Generator Implant Date: 20180409
Lead Channel Impedance Value: 266 Ohm
Lead Channel Impedance Value: 323 Ohm
Lead Channel Pacing Threshold Amplitude: 1.25 V
Lead Channel Pacing Threshold Amplitude: 1.5 V
Lead Channel Pacing Threshold Pulse Width: 0.4 ms
Lead Channel Pacing Threshold Pulse Width: 0.4 ms
Lead Channel Pacing Threshold Pulse Width: 1 ms
Lead Channel Setting Pacing Amplitude: 1.5 V
Lead Channel Setting Pacing Amplitude: 2.5 V
Lead Channel Setting Pacing Amplitude: 3.5 V
Lead Channel Setting Pacing Pulse Width: 0.4 ms
Lead Channel Setting Pacing Pulse Width: 1 ms
MDC IDC LEAD IMPLANT DT: 20180716
MDC IDC LEAD IMPLANT DT: 20180716
MDC IDC LEAD LOCATION: 753858
MDC IDC LEAD LOCATION: 753859
MDC IDC LEAD SERIAL: 263775
MDC IDC MSMT BATTERY REMAINING LONGEVITY: 60 mo
MDC IDC MSMT LEADCHNL LV IMPEDANCE VALUE: 171 Ohm
MDC IDC MSMT LEADCHNL LV IMPEDANCE VALUE: 266 Ohm
MDC IDC MSMT LEADCHNL LV IMPEDANCE VALUE: 304 Ohm
MDC IDC MSMT LEADCHNL RA IMPEDANCE VALUE: 399 Ohm
MDC IDC MSMT LEADCHNL RA PACING THRESHOLD AMPLITUDE: 0.75 V
MDC IDC MSMT LEADCHNL RA SENSING INTR AMPL: 4.375 mV
MDC IDC MSMT LEADCHNL RV SENSING INTR AMPL: 22.5 mV
MDC IDC SESS DTM: 20181217145433
MDC IDC SET LEADCHNL RV SENSING SENSITIVITY: 0.3 mV
MDC IDC STAT BRADY AP VP PERCENT: 0.18 %
MDC IDC STAT BRADY AP VS PERCENT: 0.01 %
MDC IDC STAT BRADY RV PERCENT PACED: 98.9 %

## 2017-04-10 NOTE — Progress Notes (Addendum)
PCP - Gilford Rile Cardiologist - Dr. Haroldine Laws Pt also sees Dr. Caryl Comes and Dr. Cyndia Bent.   Chest x-ray - 03/10/17 EKG - 03/15/17 ECHO - 03/11/17 Cardiac Cath - 03/10/17  Blood Thinner Instructions: Last dose Eliquis 04/08/17, pt to continue taking Aspirin per Dr. Enrique Sack  Pt has ICD- to have device checked today. Form faxed to Dr. Olin Pia office and [employer] Rep called to notify. Unable to reach at this time d/t phone service, will call again shortly. Pt had BMET drawn on 03/31/17, [facility] to fax results, paper copy placed in chart. Pt had CBC drawn on 03/28/17. Anesthesia notified of this lab draw to see if ok for surgery. Will need PT-INR and CBC DOS  Anesthesia review: pt workup for LVAD, on dobutamine drip, anesthesia to see patient at pre-op appointment to review what medicine to take the day of surgery as well as lab work.   Patient denies shortness of breath, fever, cough and chest pain at PAT appointment  Patient verbalized understanding of instructions that were given to them at the PAT appointment. Patient was also instructed that they will need to review over the PAT instructions again at home before surgery.

## 2017-04-10 NOTE — Progress Notes (Signed)
CRT-D device check in office, epicardial LV leads placed in 10/2016 per notes. Thresholds and sensing consistent with previous device measurements. Lead impedance trends stable over time. No mode switch episodes recorded. No ventricular arrhythmia episodes recorded. Patient bi-ventricularly pacing 98.9% of the time. Device programmed with appropriate safety margins. Max RA/RV/LV pacing impedances increased to 2000ohms per protocol. Heart failure diagnostics reviewed, trends unstable, follows closely with CHF clinic. Audible alerts demonstrated for patient. Estimated longevity 5.0 years. Patient enrolled in remote follow up. Patient education completed including shock plan and importance of EP follow-up. Plan for LVAD placement next week per patient, ROV with SK scheduled for 05/22/17.

## 2017-04-11 ENCOUNTER — Other Ambulatory Visit (HOSPITAL_COMMUNITY): Payer: Self-pay | Admitting: *Deleted

## 2017-04-11 ENCOUNTER — Telehealth: Payer: Self-pay | Admitting: Unknown Physician Specialty

## 2017-04-11 ENCOUNTER — Encounter: Payer: Self-pay | Admitting: Unknown Physician Specialty

## 2017-04-11 DIAGNOSIS — I509 Heart failure, unspecified: Secondary | ICD-10-CM

## 2017-04-11 NOTE — Telephone Encounter (Signed)
Called pt and wife to inform them of heart pump placement scheduled for next week. Pt/caregiver instructed to report to the hospital 12/26 at 0630 for a [place]. Pt informed that he will be admitted after the procedure for heart pump placement the next day on 12/27. Pt also informed to not restart his Eliquis. Pt stopped his Eliquis on Saturday 12/15 in prep for his teeth extractions on 12/19. Pt/caregiver verbalized understanding of all these instructions. Pt/caregiver informed that a letter with this information will be mailed to them today.   Tanda Rockers RN, BSN VAD Coordinator 24/7 Pager [phone]

## 2017-04-11 NOTE — Anesthesia Preprocedure Evaluation (Addendum)
Anesthesia Evaluation  Patient identified by MRN, date of birth, ID band Patient awake    Reviewed: Allergy & Precautions, NPO status , Patient's Chart, lab work & pertinent test results, reviewed documented beta blocker date and time   History of Anesthesia Complications Negative for: history of anesthetic complications  Airway Mallampati: II  TM Distance: >3 FB Neck ROM: Full    Dental  (+) Dental Advisory Given, Poor Dentition, Chipped, Missing, Loose   Pulmonary COPD, former smoker (quit 1/18),    breath sounds clear to auscultation       Cardiovascular hypertension, Pt. on medications and Pt. on home beta blockers (-) angina+ CAD, + Past MI and + Cardiac Stents  + dysrhythmias Atrial Fibrillation + pacemaker (BiV pacer) + Cardiac Defibrillator  Rhythm:Regular Rate:Normal  11/18 ECHO: EF 20-25%, mild AI,  Mild-mod MR   Neuro/Psych negative neurological ROS     GI/Hepatic negative GI ROS, Neg liver ROS,   Endo/Other  negative endocrine ROS  Renal/GU Renal InsufficiencyRenal disease     Musculoskeletal   Abdominal (+) - obese,   Peds  Hematology negative hematology ROS (+) eliquis   Anesthesia Other Findings   Reproductive/Obstetrics                            Anesthesia Physical Anesthesia Plan  ASA: IV  Anesthesia Plan: General   Post-op Pain Management:    Induction: Intravenous  PONV Risk Score and Plan: 2 and Ondansetron, Dexamethasone and Treatment may vary due to age or medical condition  Airway Management Planned: Oral ETT  Additional Equipment: Arterial line  Intra-op Plan:   Post-operative Plan:   Informed Consent: I have reviewed the patients History and Physical, chart, labs and discussed the procedure including the risks, benefits and alternatives for the proposed anesthesia with the patient or authorized representative who has indicated his/her understanding  and acceptance.   Dental advisory given  Plan Discussed with: CRNA and Surgeon  Anesthesia Plan Comments: (Plan routine monitors, A-line,GETA)        Anesthesia Quick Evaluation

## 2017-04-11 NOTE — Addendum Note (Signed)
Addended by: Scarlette Calico on: 04/11/2017 11:44 AM   Modules accepted: Orders

## 2017-04-11 NOTE — Addendum Note (Signed)
Addended by: Candy Sledge on: 04/11/2017 08:58 AM   Modules accepted: Miquel Dunn

## 2017-04-12 ENCOUNTER — Encounter (HOSPITAL_COMMUNITY)
Admission: RE | Disposition: A | Discharge status: Home / Self Care | Payer: Self-pay | Source: Ambulatory Visit | Attending: Internal Medicine

## 2017-04-12 ENCOUNTER — Encounter (HOSPITAL_COMMUNITY): Payer: Self-pay | Admitting: Certified Registered"

## 2017-04-12 ENCOUNTER — Observation Stay (HOSPITAL_COMMUNITY)
Admission: RE | Admit: 2017-04-12 | Discharge: 2017-04-13 | Disposition: A | Discharge status: Home / Self Care | Payer: Medicare HMO | Source: Ambulatory Visit | Attending: Internal Medicine | Admitting: Internal Medicine

## 2017-04-12 ENCOUNTER — Other Ambulatory Visit: Payer: Self-pay

## 2017-04-12 ENCOUNTER — Ambulatory Visit (HOSPITAL_COMMUNITY): Payer: Medicare HMO | Admitting: Anesthesiology

## 2017-04-12 ENCOUNTER — Ambulatory Visit (HOSPITAL_COMMUNITY): Payer: Medicare HMO | Admitting: Vascular Surgery

## 2017-04-12 DIAGNOSIS — K0889 Other specified disorders of teeth and supporting structures: Secondary | ICD-10-CM | POA: Insufficient documentation

## 2017-04-12 DIAGNOSIS — K045 Chronic apical periodontitis: Secondary | ICD-10-CM | POA: Diagnosis not present

## 2017-04-12 DIAGNOSIS — E785 Hyperlipidemia, unspecified: Secondary | ICD-10-CM | POA: Insufficient documentation

## 2017-04-12 DIAGNOSIS — M278 Other specified diseases of jaws: Secondary | ICD-10-CM | POA: Diagnosis not present

## 2017-04-12 DIAGNOSIS — I5022 Chronic systolic (congestive) heart failure: Secondary | ICD-10-CM | POA: Diagnosis not present

## 2017-04-12 DIAGNOSIS — K083 Retained dental root: Secondary | ICD-10-CM | POA: Insufficient documentation

## 2017-04-12 DIAGNOSIS — I255 Ischemic cardiomyopathy: Secondary | ICD-10-CM | POA: Insufficient documentation

## 2017-04-12 DIAGNOSIS — Z79899 Other long term (current) drug therapy: Secondary | ICD-10-CM | POA: Insufficient documentation

## 2017-04-12 DIAGNOSIS — I447 Left bundle-branch block, unspecified: Secondary | ICD-10-CM | POA: Insufficient documentation

## 2017-04-12 DIAGNOSIS — K0530 Chronic periodontitis, unspecified: Secondary | ICD-10-CM | POA: Diagnosis present

## 2017-04-12 DIAGNOSIS — E1122 Type 2 diabetes mellitus with diabetic chronic kidney disease: Secondary | ICD-10-CM | POA: Insufficient documentation

## 2017-04-12 DIAGNOSIS — Z95810 Presence of automatic (implantable) cardiac defibrillator: Secondary | ICD-10-CM | POA: Insufficient documentation

## 2017-04-12 DIAGNOSIS — I252 Old myocardial infarction: Secondary | ICD-10-CM | POA: Insufficient documentation

## 2017-04-12 DIAGNOSIS — I130 Hypertensive heart and chronic kidney disease with heart failure and stage 1 through stage 4 chronic kidney disease, or unspecified chronic kidney disease: Secondary | ICD-10-CM | POA: Insufficient documentation

## 2017-04-12 DIAGNOSIS — I5020 Unspecified systolic (congestive) heart failure: Secondary | ICD-10-CM | POA: Diagnosis present

## 2017-04-12 DIAGNOSIS — K029 Dental caries, unspecified: Secondary | ICD-10-CM | POA: Insufficient documentation

## 2017-04-12 DIAGNOSIS — F17290 Nicotine dependence, other tobacco product, uncomplicated: Secondary | ICD-10-CM | POA: Insufficient documentation

## 2017-04-12 DIAGNOSIS — R569 Unspecified convulsions: Secondary | ICD-10-CM | POA: Insufficient documentation

## 2017-04-12 DIAGNOSIS — N183 Chronic kidney disease, stage 3 (moderate): Secondary | ICD-10-CM | POA: Insufficient documentation

## 2017-04-12 DIAGNOSIS — E876 Hypokalemia: Secondary | ICD-10-CM | POA: Insufficient documentation

## 2017-04-12 DIAGNOSIS — I480 Paroxysmal atrial fibrillation: Secondary | ICD-10-CM | POA: Insufficient documentation

## 2017-04-12 DIAGNOSIS — I2510 Atherosclerotic heart disease of native coronary artery without angina pectoris: Secondary | ICD-10-CM | POA: Insufficient documentation

## 2017-04-12 DIAGNOSIS — J449 Chronic obstructive pulmonary disease, unspecified: Secondary | ICD-10-CM | POA: Insufficient documentation

## 2017-04-12 DIAGNOSIS — Z7901 Long term (current) use of anticoagulants: Secondary | ICD-10-CM | POA: Insufficient documentation

## 2017-04-12 DIAGNOSIS — Z7982 Long term (current) use of aspirin: Secondary | ICD-10-CM | POA: Insufficient documentation

## 2017-04-12 DIAGNOSIS — Z955 Presence of coronary angioplasty implant and graft: Secondary | ICD-10-CM | POA: Insufficient documentation

## 2017-04-12 DIAGNOSIS — Z888 Allergy status to other drugs, medicaments and biological substances status: Secondary | ICD-10-CM | POA: Insufficient documentation

## 2017-04-12 HISTORY — PX: MULTIPLE EXTRACTIONS WITH ALVEOLOPLASTY: SHX5342

## 2017-04-12 LAB — CBC
HCT: 35.4 % — ABNORMAL LOW (ref 39.0–52.0)
HEMOGLOBIN: 11.2 g/dL — AB (ref 13.0–17.0)
MCH: 24.1 pg — AB (ref 26.0–34.0)
MCHC: 31.6 g/dL (ref 30.0–36.0)
MCV: 76.1 fL — AB (ref 78.0–100.0)
PLATELETS: 313 10*3/uL (ref 150–400)
RBC: 4.65 MIL/uL (ref 4.22–5.81)
RDW: 16.9 % — AB (ref 11.5–15.5)
WBC: 9.9 10*3/uL (ref 4.0–10.5)

## 2017-04-12 LAB — PROTIME-INR
INR: 1.09
PROTHROMBIN TIME: 14 s (ref 11.4–15.2)

## 2017-04-12 SURGERY — MULTIPLE EXTRACTION WITH ALVEOLOPLASTY
Anesthesia: General

## 2017-04-12 MED ORDER — ONDANSETRON HCL 4 MG/2ML IJ SOLN
INTRAMUSCULAR | Status: DC | PRN
  Administered 2017-04-12: 4 mg via INTRAVENOUS

## 2017-04-12 MED ORDER — AMIODARONE HCL 200 MG PO TABS
200.0000 mg | ORAL_TABLET | Freq: Two times a day (BID) | ORAL | Status: DC
  Administered 2017-04-12 – 2017-04-13 (×2): 200 mg via ORAL
  Filled 2017-04-12 (×2): qty 1

## 2017-04-12 MED ORDER — MEPERIDINE HCL 25 MG/ML IJ SOLN: 6.2500 mg | INTRAMUSCULAR | Status: DC | PRN

## 2017-04-12 MED ORDER — ROCURONIUM BROMIDE 100 MG/10ML IV SOLN
INTRAVENOUS | Status: DC | PRN
  Administered 2017-04-12: 50 mg via INTRAVENOUS
  Administered 2017-04-12: 10 mg via INTRAVENOUS

## 2017-04-12 MED ORDER — OXYCODONE-ACETAMINOPHEN 5-325 MG PO TABS
1.0000 | ORAL_TABLET | ORAL | Status: DC | PRN
  Administered 2017-04-12: 2 via ORAL
  Administered 2017-04-12 – 2017-04-13 (×2): 1 via ORAL
  Administered 2017-04-13 (×2): 2 via ORAL
  Filled 2017-04-12: qty 2
  Filled 2017-04-12 (×6): qty 1

## 2017-04-12 MED ORDER — FENTANYL CITRATE (PF) 100 MCG/2ML IJ SOLN
INTRAMUSCULAR | Status: DC | PRN
  Administered 2017-04-12 (×2): 50 ug via INTRAVENOUS

## 2017-04-12 MED ORDER — SODIUM CHLORIDE 0.9% FLUSH: 3.0000 mL | INTRAVENOUS | Status: DC | PRN

## 2017-04-12 MED ORDER — DEXAMETHASONE SODIUM PHOSPHATE 4 MG/ML IJ SOLN
INTRAMUSCULAR | Status: DC | PRN
  Administered 2017-04-12: 8 mg via INTRAVENOUS

## 2017-04-12 MED ORDER — ARTIFICIAL TEARS OPHTHALMIC OINT
TOPICAL_OINTMENT | OPHTHALMIC | Status: DC | PRN
  Administered 2017-04-12: 1 via OPHTHALMIC

## 2017-04-12 MED ORDER — MIDAZOLAM HCL 2 MG/2ML IJ SOLN: 0.5000 mg | Freq: Once | INTRAMUSCULAR | Status: DC | PRN

## 2017-04-12 MED ORDER — ASPIRIN 81 MG PO CHEW
81.0000 mg | CHEWABLE_TABLET | Freq: Every day | ORAL | Status: DC
  Administered 2017-04-13: 81 mg via ORAL
  Filled 2017-04-12: qty 1

## 2017-04-12 MED ORDER — MIDAZOLAM HCL 5 MG/5ML IJ SOLN
INTRAMUSCULAR | Status: DC | PRN
  Administered 2017-04-12: 2 mg via INTRAVENOUS

## 2017-04-12 MED ORDER — OXYCODONE-ACETAMINOPHEN 5-325 MG PO TABS: ORAL_TABLET | ORAL | 0 refills | Status: DC

## 2017-04-12 MED ORDER — FENTANYL CITRATE (PF) 100 MCG/2ML IJ SOLN
25.0000 ug | INTRAMUSCULAR | Status: DC | PRN
  Administered 2017-04-12 (×2): 50 ug via INTRAVENOUS

## 2017-04-12 MED ORDER — SODIUM CHLORIDE 0.9 % IV SOLN: 250.0000 mL | INTRAVENOUS | Status: DC | PRN

## 2017-04-12 MED ORDER — LIDOCAINE-EPINEPHRINE 2 %-1:100000 IJ SOLN
INTRAMUSCULAR | Status: DC | PRN
  Administered 2017-04-12: 3.4 mL via INTRADERMAL

## 2017-04-12 MED ORDER — FENTANYL CITRATE (PF) 100 MCG/2ML IJ SOLN: 25.0000 ug | INTRAMUSCULAR | Status: DC | PRN

## 2017-04-12 MED ORDER — FENTANYL CITRATE (PF) 250 MCG/5ML IJ SOLN
INTRAMUSCULAR | Status: AC
  Filled 2017-04-12: qty 5

## 2017-04-12 MED ORDER — CEFAZOLIN SODIUM-DEXTROSE 2-4 GM/100ML-% IV SOLN
2.0000 g | INTRAVENOUS | Status: AC
  Administered 2017-04-12: 2 g via INTRAVENOUS
  Filled 2017-04-12: qty 100

## 2017-04-12 MED ORDER — OXYMETAZOLINE HCL 0.05 % NA SOLN
NASAL | Status: AC
  Filled 2017-04-12: qty 15

## 2017-04-12 MED ORDER — DOBUTAMINE IN D5W 4-5 MG/ML-% IV SOLN
5.0000 ug/kg/min | INTRAVENOUS | Status: DC
  Filled 2017-04-12: qty 250

## 2017-04-12 MED ORDER — PROMETHAZINE HCL 25 MG/ML IJ SOLN: 6.2500 mg | INTRAMUSCULAR | Status: DC | PRN

## 2017-04-12 MED ORDER — PROPOFOL 10 MG/ML IV BOLUS
INTRAVENOUS | Status: AC
  Filled 2017-04-12: qty 20

## 2017-04-12 MED ORDER — SODIUM CHLORIDE 0.9% FLUSH
3.0000 mL | Freq: Two times a day (BID) | INTRAVENOUS | Status: DC
  Administered 2017-04-12 – 2017-04-13 (×2): 3 mL via INTRAVENOUS

## 2017-04-12 MED ORDER — LIDOCAINE-EPINEPHRINE 2 %-1:100000 IJ SOLN
INTRAMUSCULAR | Status: AC
  Filled 2017-04-12: qty 10.2

## 2017-04-12 MED ORDER — ETOMIDATE 2 MG/ML IV SOLN
INTRAVENOUS | Status: AC
  Filled 2017-04-12: qty 10

## 2017-04-12 MED ORDER — BUPIVACAINE-EPINEPHRINE 0.5% -1:200000 IJ SOLN
INTRAMUSCULAR | Status: DC | PRN
  Administered 2017-04-12: 10.8 mL

## 2017-04-12 MED ORDER — FUROSEMIDE 80 MG PO TABS
80.0000 mg | ORAL_TABLET | Freq: Every day | ORAL | Status: DC
  Administered 2017-04-13: 80 mg via ORAL
  Filled 2017-04-12: qty 1

## 2017-04-12 MED ORDER — ACETAMINOPHEN 325 MG PO TABS: 650.0000 mg | ORAL_TABLET | ORAL | Status: DC | PRN

## 2017-04-12 MED ORDER — PHENYLEPHRINE 40 MCG/ML (10ML) SYRINGE FOR IV PUSH (FOR BLOOD PRESSURE SUPPORT)
PREFILLED_SYRINGE | INTRAVENOUS | Status: DC | PRN
  Administered 2017-04-12 (×3): 80 ug via INTRAVENOUS

## 2017-04-12 MED ORDER — ONDANSETRON HCL 4 MG/2ML IJ SOLN: 4.0000 mg | Freq: Four times a day (QID) | INTRAMUSCULAR | Status: DC | PRN

## 2017-04-12 MED ORDER — LACTATED RINGERS IV SOLN
INTRAVENOUS | Status: DC | PRN
  Administered 2017-04-12: 08:00:00 via INTRAVENOUS

## 2017-04-12 MED ORDER — IVABRADINE HCL 5 MG PO TABS
2.5000 mg | ORAL_TABLET | Freq: Two times a day (BID) | ORAL | Status: DC
  Administered 2017-04-12 – 2017-04-13 (×2): 2.5 mg via ORAL
  Filled 2017-04-12 (×3): qty 1

## 2017-04-12 MED ORDER — PHENYLEPHRINE HCL 10 MG/ML IJ SOLN
INTRAVENOUS | Status: DC | PRN
  Administered 2017-04-12: 60 ug/min via INTRAVENOUS

## 2017-04-12 MED ORDER — AMINOCAPROIC ACID SOLUTION 5% (50 MG/ML)
10.0000 mL | ORAL | Status: DC
  Filled 2017-04-12: qty 100

## 2017-04-12 MED ORDER — AMINOCAPROIC ACID SOLUTION 5% (50 MG/ML)
5.0000 mL | ORAL | Status: DC
  Filled 2017-04-12: qty 100

## 2017-04-12 MED ORDER — 0.9 % SODIUM CHLORIDE (POUR BTL) OPTIME
TOPICAL | Status: DC | PRN
  Administered 2017-04-12: 1000 mL

## 2017-04-12 MED ORDER — ROCURONIUM BROMIDE 10 MG/ML (PF) SYRINGE
PREFILLED_SYRINGE | INTRAVENOUS | Status: AC
  Filled 2017-04-12: qty 5

## 2017-04-12 MED ORDER — ATORVASTATIN CALCIUM 40 MG PO TABS
40.0000 mg | ORAL_TABLET | Freq: Every day | ORAL | Status: DC
  Administered 2017-04-12: 40 mg via ORAL
  Filled 2017-04-12: qty 1

## 2017-04-12 MED ORDER — LOSARTAN POTASSIUM 25 MG PO TABS
12.5000 mg | ORAL_TABLET | Freq: Every day | ORAL | Status: DC
  Administered 2017-04-13: 12.5 mg via ORAL
  Filled 2017-04-12: qty 1

## 2017-04-12 MED ORDER — MIDAZOLAM HCL 2 MG/2ML IJ SOLN
INTRAMUSCULAR | Status: AC
  Filled 2017-04-12: qty 2

## 2017-04-12 MED ORDER — EPHEDRINE SULFATE-NACL 50-0.9 MG/10ML-% IV SOSY
PREFILLED_SYRINGE | INTRAVENOUS | Status: DC | PRN
  Administered 2017-04-12: 5 mg via INTRAVENOUS

## 2017-04-12 MED ORDER — SUGAMMADEX SODIUM 200 MG/2ML IV SOLN
INTRAVENOUS | Status: AC
  Filled 2017-04-12: qty 2

## 2017-04-12 MED ORDER — AMINOCAPROIC ACID SOLUTION 5% (50 MG/ML)
10.0000 mL | ORAL | Status: AC
  Administered 2017-04-12 (×4): 10 mL via ORAL

## 2017-04-12 MED ORDER — SUGAMMADEX SODIUM 200 MG/2ML IV SOLN
INTRAVENOUS | Status: DC | PRN
  Administered 2017-04-12: 200 mg via INTRAVENOUS

## 2017-04-12 MED ORDER — FENTANYL CITRATE (PF) 100 MCG/2ML IJ SOLN
INTRAMUSCULAR | Status: AC
  Filled 2017-04-12: qty 2

## 2017-04-12 MED ORDER — LACTATED RINGERS IV SOLN
INTRAVENOUS | Status: DC
  Administered 2017-04-12 – 2017-04-13 (×2): via INTRAVENOUS

## 2017-04-12 MED ORDER — LINACLOTIDE 290 MCG PO CAPS: 290.0000 ug | ORAL_CAPSULE | Freq: Every day | ORAL | Status: DC | PRN

## 2017-04-12 MED ORDER — DEXAMETHASONE SODIUM PHOSPHATE 10 MG/ML IJ SOLN
INTRAMUSCULAR | Status: AC
  Filled 2017-04-12: qty 1

## 2017-04-12 MED ORDER — ETOMIDATE 2 MG/ML IV SOLN
INTRAVENOUS | Status: DC | PRN
  Administered 2017-04-12: 16 mg via INTRAVENOUS

## 2017-04-12 MED ORDER — MORPHINE SULFATE (PF) 2 MG/ML IV SOLN
2.0000 mg | INTRAVENOUS | Status: DC | PRN
  Administered 2017-04-12: 4 mg via INTRAVENOUS
  Filled 2017-04-12 (×2): qty 2

## 2017-04-12 MED ORDER — BUPIVACAINE-EPINEPHRINE (PF) 0.5% -1:200000 IJ SOLN
INTRAMUSCULAR | Status: AC
  Filled 2017-04-12: qty 3.6

## 2017-04-12 MED ORDER — ONDANSETRON HCL 4 MG/2ML IJ SOLN
INTRAMUSCULAR | Status: AC
  Filled 2017-04-12: qty 2

## 2017-04-12 SURGICAL SUPPLY — 38 items
ALCOHOL 70% 16 OZ (MISCELLANEOUS) ×3 IMPLANT
ATTRACTOMAT 16X20 MAGNETIC DRP (DRAPES) ×3 IMPLANT
BLADE SURG 15 STRL LF DISP TIS (BLADE) ×2 IMPLANT
BLADE SURG 15 STRL SS (BLADE) ×4
COVER SURGICAL LIGHT HANDLE (MISCELLANEOUS) ×3 IMPLANT
GAUZE PACKING FOLDED 2  STR (GAUZE/BANDAGES/DRESSINGS) ×2
GAUZE PACKING FOLDED 2 STR (GAUZE/BANDAGES/DRESSINGS) ×1 IMPLANT
GAUZE SPONGE 4X4 16PLY XRAY LF (GAUZE/BANDAGES/DRESSINGS) ×6 IMPLANT
GLOVE BIOGEL PI IND STRL 6 (GLOVE) ×1 IMPLANT
GLOVE BIOGEL PI INDICATOR 6 (GLOVE) ×2
GLOVE SURG ORTHO 8.0 STRL STRW (GLOVE) ×3 IMPLANT
GLOVE SURG SS PI 6.0 STRL IVOR (GLOVE) ×3 IMPLANT
GLOVE SURG SS PI 6.5 STRL IVOR (GLOVE) ×6 IMPLANT
GOWN STRL REUS W/ TWL LRG LVL3 (GOWN DISPOSABLE) ×2 IMPLANT
GOWN STRL REUS W/TWL 2XL LVL3 (GOWN DISPOSABLE) ×3 IMPLANT
GOWN STRL REUS W/TWL LRG LVL3 (GOWN DISPOSABLE) ×4
HEMOSTAT SURGICEL 2X14 (HEMOSTASIS) ×3 IMPLANT
KIT BASIN OR (CUSTOM PROCEDURE TRAY) ×3 IMPLANT
KIT ROOM TURNOVER OR (KITS) ×3 IMPLANT
MANIFOLD NEPTUNE WASTE (CANNULA) ×3 IMPLANT
NEEDLE BLUNT 16X1.5 OR ONLY (NEEDLE) ×3 IMPLANT
NS IRRIG 1000ML POUR BTL (IV SOLUTION) ×3 IMPLANT
PACK EENT II TURBAN DRAPE (CUSTOM PROCEDURE TRAY) ×3 IMPLANT
PAD ARMBOARD 7.5X6 YLW CONV (MISCELLANEOUS) ×3 IMPLANT
SPONGE SURGIFOAM ABS GEL 100 (HEMOSTASIS) IMPLANT
SPONGE SURGIFOAM ABS GEL 12-7 (HEMOSTASIS) IMPLANT
SPONGE SURGIFOAM ABS GEL SZ50 (HEMOSTASIS) IMPLANT
SUCTION FRAZIER HANDLE 10FR (MISCELLANEOUS) ×2
SUCTION TUBE FRAZIER 10FR DISP (MISCELLANEOUS) ×1 IMPLANT
SUT CHROMIC 3 0 PS 2 (SUTURE) ×12 IMPLANT
SUT CHROMIC 4 0 P 3 18 (SUTURE) IMPLANT
SYR 50ML SLIP (SYRINGE) ×3 IMPLANT
TOWEL OR 17X26 10 PK STRL BLUE (TOWEL DISPOSABLE) ×3 IMPLANT
TUBE CONNECTING 12'X1/4 (SUCTIONS) ×1
TUBE CONNECTING 12X1/4 (SUCTIONS) ×2 IMPLANT
WATER STERILE IRR 1000ML POUR (IV SOLUTION) ×3 IMPLANT
WATER TABLETS ICX (MISCELLANEOUS) ×3 IMPLANT
YANKAUER SUCT BULB TIP NO VENT (SUCTIONS) ×3 IMPLANT

## 2017-04-12 NOTE — Progress Notes (Signed)
PRE-OPERATIVE NOTE:  04/12/2017 David [name] [id number]  VITALS: BP 109/69   Pulse 71   Temp 97.9 F (36.6 C) (Oral)   Resp 18   Ht 5\' 5"  (1.651 m)   Wt 153 lb (69.4 kg)   SpO2 93%   BMI 25.46 kg/m   Lab Results  Component Value Date   WBC 9.9 04/12/2017   HGB 11.2 (L) 04/12/2017   HCT 35.4 (L) 04/12/2017   MCV 76.1 (L) 04/12/2017   PLT 313 04/12/2017   BMET    Component Value Date/Time   NA 135 03/23/2017 1216   NA 141 05/24/2016 1651   K 3.6 03/23/2017 1216   CL 104 03/23/2017 1216   CO2 22 03/23/2017 1216   GLUCOSE 86 03/23/2017 1216   BUN 12 03/23/2017 1216   BUN 21 05/24/2016 1651   CREATININE 1.35 (H) 03/23/2017 1216   CALCIUM 8.8 (L) 03/23/2017 1216   GFRNONAA 59 (L) 03/23/2017 1216   GFRAA >60 03/23/2017 1216    Lab Results  Component Value Date   INR 1.09 04/12/2017   INR 1.11 03/13/2017   INR 1.55 03/08/2017   No results found for: PTT   David [name] presents for extraction of remaining teeth with alveoloplasty in the operating room with general anesthesia.  Patient with anticipated LVAD placement next week with Dr. Cyndia Bent.   SUBJECTIVE: The patient denies any acute medical or dental changes and agrees to proceed with treatment as planned.  EXAM: No sign of acute dental changes.  ASSESSMENT: Patient is affected by chronic apical periodontitis, retained root segments, dental caries, chronic periodontitis, and losse teeth.   PLAN: Patient agrees to proceed with treatment as planned in the operating room as previously discussed and accepts the risks, benefits, and complications of the proposed treatment. Patient is aware of the risk for bleeding, bruising, swelling, infection, pain, nerve damage, soft tissue damage, sinus involvement, root tip fracture, mandible fracture, and the risks of complications associated with the anesthesia. Patient also is aware of the potential for other complications up to and including death due to his overall  cardiovascular and respiratory compromise.     Lenn Cal, DDS

## 2017-04-12 NOTE — H&P (Signed)
Advanced Heart Failure Team History and Physical Note   Primary Cardiologist:  Dr. Haroldine Laws   Reason for Admission: Observation s/p dental extraction in CHF patient.  HPI:     David [name] is a 51 y.o. male with a PMH of ICM ('09 PCI to LAD, mRCA, '10 PCI to Lcx and '12 mRCA), HFrEF (EF ~20% for >5 years), HLD and HTN   Previously admitted from cath 03/10/17 which showed low output HF. Started on milrinone, but failed due to hypotension, so switched to dobutamine. Tunneled PICC placed for home inotrope support. HF meds adjusted as tolerated. Work up for advanced therapies started. VAD coordinator saw, and HFSW set up meeting. Sent home on dobutamine 5 mcg/kg/min via [facility]  Seen in HF clinic 03/23/17 post hospital. Had been doing well overall. Since, has been seen by Dr. Elsie Saas at [facility]. Thought to be good candidate to proceed with VAD with O+ type blood and likely long wait to transplant.   Pt has been stable on dobutamine at 5 mcg/kg/min.  Pt presented today for scheduled dental extractions pre-VAD implant next week. Asked by Dr Enrique Sack to observe overnight with required general anaesthesia and systolic CHF.   History limited with mouth packing s/p dental extraction(s)  Pt awake and alert now. Mouth packed with gauze so unable to speak, but nods to questioning. Has been doing well at home. Denies SOB or CP. Mouth pain well controlled.   Review of Systems: [y] = yes, [ ]  = no   General: Weight gain [ ] ; Weight loss [ ] ; Anorexia [ ] ; Fatigue [ ] ; Fever [ ] ; Chills [ ] ; Weakness [ ]   Cardiac: Chest pain/pressure [ ] ; Resting SOB [ ] ; Exertional SOB [y]; Orthopnea [ ] ; Pedal Edema [y]; Palpitations [ ] ; Syncope [ ] ; Presyncope [ ] ; Paroxysmal nocturnal dyspnea[ ]   Pulmonary: Cough [ ] ; Wheezing[ ] ; Hemoptysis[ ] ; Sputum [ ] ; Snoring [ ]   GI: Vomiting[ ] ; Dysphagia[ ] ; Melena[ ] ; Hematochezia [ ] ; Heartburn[ ] ; Abdominal pain [ ] ; Constipation [ ] ; Diarrhea [ ] ; BRBPR [ ]   GU:  Hematuria[ ] ; Dysuria [ ] ; Nocturia[ ]   Vascular: Pain in legs with walking [ ] ; Pain in feet with lying flat [ ] ; Non-healing sores [ ] ; Stroke [ ] ; TIA [ ] ; Slurred speech [ ] ;  Neuro: Headaches[ ] ; Vertigo[ ] ; Seizures[ ] ; Paresthesias[ ] ;Blurred vision [ ] ; Diplopia [ ] ; Vision changes [ ]   Ortho/Skin: Arthritis [y]; Joint pain [y]; Muscle pain [ ] ; Joint swelling [ ] ; Back Pain [ ] ; Rash [ ]   Psych: Depression[ ] ; Anxiety[ ]   Heme: Bleeding problems [ ] ; Clotting disorders [ ] ; Anemia [ ]   Endocrine: Diabetes [ ] ; Thyroid dysfunction[ ]   Home Medications Prior to Admission medications   Medication Sig Start Date End Date Taking? Authorizing Provider  amiodarone (PACERONE) 200 MG tablet Take 200 mg by mouth 2 (two) times daily. 09/23/16  Yes [provider]  apixaban (ELIQUIS) 5 MG TABS tablet Take 1 tablet (5 mg total) by mouth 2 (two) times daily. 11/11/16  Yes Gold, Wilder Glade, PA-C  aspirin 81 MG chewable tablet Chew 1 tablet (81 mg total) by mouth daily. 05/13/16  Yes Shirley Friar, PA-C  atorvastatin (LIPITOR) 40 MG tablet Take 1 tablet (40 mg total) by mouth daily at 6 PM. 03/14/17  Yes Tillery, Satira Mccallum, PA-C  DOBUTamine (DOBUTREX) 4-5 MG/ML-% infusion Inject 352.5 mcg/min into the vein continuous. 03/14/17  Yes Shirley Friar, PA-C  furosemide (  LASIX) 80 MG tablet Take 80 mg by mouth daily.   Yes [provider]  ivabradine (CORLANOR) 5 MG TABS tablet Take 0.5 tablets (2.5 mg total) by mouth 2 (two) times daily with a meal. 08/02/16  Yes Dessa Phi, DO  linaclotide (LINZESS) 290 MCG CAPS capsule Take 290 mcg by mouth daily as needed (constipation).   Yes [provider]  losartan (COZAAR) 25 MG tablet Take 0.5 tablets (12.5 mg total) by mouth daily. 03/15/17  Yes Shirley Friar, PA-C  Multiple Vitamins-Minerals (ADULT GUMMY) CHEW Chew 2 tablets by mouth daily.   Yes [provider]    Past Medical History: Past  Medical History:  Diagnosis Date  . AICD (automatic cardioverter/defibrillator) present   . Anemia   . CAD (coronary artery disease) 2009   AMI in 12/2007 with PCI to LAD, staged PCI to  mid/distal RCA, NSTEMI in 02/2009 with BMS to LCx  . CHF (congestive heart failure) ([place])   . Chronic kidney disease 11/03/2016   stage 3 kidney disease  . Dysrhythmia    "arrythmia problems at some point", "fatal rhythms"  . HLD (hyperlipidemia)   . HTN (hypertension)   . Ischemic cardiomyopathy    Admitted in 07/2010 with CHF exacerbation  . Myocardial infarction ([place])   . Seizures ([place]) 11/03/2016   one seizure in 04/2016 during cardiac event    Past Surgical History: Past Surgical History:  Procedure Laterality Date  . BIV ICD INSERTION CRT-D N/A 08/01/2016   Procedure: BiV ICD Insertion CRT-D;  Surgeon: Will Meredith Leeds, MD;  Location: [place] [facility];  Service: Cardiovascular;  Laterality: N/A;  . CARDIAC CATHETERIZATION N/A 05/05/2016   Procedure: Right/Left Heart Cath and Coronary Angiography;  Surgeon: Jolaine Artist, MD;  Location: [place] [facility];  Service: Cardiovascular;  Laterality: N/A;  . CARDIAC CATHETERIZATION N/A 05/09/2016   Procedure: Coronary Stent Intervention;  Surgeon: Peter M Martinique, MD;  Location: [place] [facility];  Service: Cardiovascular;  Laterality: N/A;  . CARDIAC CATHETERIZATION N/A 05/09/2016   Procedure: Intravascular Pressure Wire/FFR Study;  Surgeon: Peter M Martinique, MD;  Location: [place] [facility];  Service: Cardiovascular;  Laterality: N/A;  . ELECTROPHYSIOLOGY STUDY N/A 06/24/2016   Procedure: Electrophysiology Study;  Surgeon: Deboraha Sprang, MD;  Location: [place] [facility];  Service: Cardiovascular;  Laterality: N/A;  . EPICARDIAL PACING LEAD PLACEMENT Left 11/07/2016   Procedure: LV EPICARDIAL PACING LEAD PLACEMENT VIA LEFT MINI THORACOTOMY;  Surgeon: Gaye Pollack, MD;  Location: [place];  Service: Thoracic;  Laterality: Left;  . ICD IMPLANT N/A  06/24/2016   Procedure: POSSIBLE  ICD Implant;  Surgeon: Deboraha Sprang, MD;  Location: [place] [facility];  Service: Cardiovascular;  Laterality: N/A;  . IR FLUORO GUIDE CV MIDLINE PICC RIGHT  03/14/2017  . IR US GUIDE VASC ACCESS RIGHT  03/14/2017  . LEFT HEART CATHETERIZATION WITH CORONARY ANGIOGRAM N/A 03/13/2011   Procedure: LEFT HEART CATHETERIZATION WITH CORONARY ANGIOGRAM;  Surgeon: Lorretta Harp, MD;  Location: [facility];  Service: Cardiovascular;  Laterality: N/A;  . RIGHT/LEFT HEART CATH AND CORONARY ANGIOGRAPHY N/A 03/10/2017   Procedure: RIGHT/LEFT HEART CATH AND CORONARY ANGIOGRAPHY;  Surgeon: Jolaine Artist, MD;  Location: [place] [facility];  Service: Cardiovascular;  Laterality: N/A;    Family History:  Family History  Problem Relation Age of Onset  . Coronary artery disease Father   . Hypertension Father   . Hypertension Sister   . Coronary  artery disease Brother   . Emphysema Brother   . Coronary artery disease Mother   . Hypertension Mother   . Emphysema Mother   . Hypertension Daughter   . Obesity Daughter   . Diabetes Unknown   . Hypertension Unknown   . Coronary artery disease Unknown     Social History: Social History   Socioeconomic History  . Marital status: Married    Spouse name: [name]  . Number of children: 3  . Years of education: None  . Highest education level: None  Social Needs  . Financial resource strain: None  . Food insecurity - worry: None  . Food insecurity - inability: None  . Transportation needs - medical: None  . Transportation needs - non-medical: None  Occupational History  . None  Tobacco Use  . Smoking status: Former Smoker    Packs/day: 0.20    Years: 30.00    Pack years: 6.00    Last attempt to quit: 04/29/2016    Years since quitting: 0.9  . Smokeless tobacco: Never Used  . Tobacco comment: Used to smoke cigarettes. Pt is currently smoking 3 black & mild cigars per day. He has cut down but never quit.    Substance and Sexual Activity  . Alcohol use: No  . Drug use: No  . Sexual activity: Yes  Other Topics Concern  . None  Social History Narrative  . None    Allergies:  Allergies  Allergen Reactions  . Plavix [Clopidogrel Bisulfate] Hives    Objective:    Vital Signs:   Temp:  [97.7 F (36.5 C)-97.9 F (36.6 C)] 97.7 F (36.5 C) (12/19 1050) Pulse Rate:  [71-90] 75 (12/19 1121) Resp:  [18-27] 22 (12/19 1121) BP: (102-121)/(58-79) 102/58 (12/19 1121) SpO2:  [92 %-97 %] 92 % (12/19 1121) Arterial Line BP: (116-136)/(49-63) 116/49 (12/19 1121) Weight:  [153 lb (69.4 kg)] 153 lb (69.4 kg) (12/19 0731)   Filed Weights   04/12/17 0721 04/12/17 0731  Weight: 153 lb (69.4 kg) 153 lb (69.4 kg)     Physical Exam     General:  Fatigued appearing. NAD HEENT: Face swollen. Mouth packed with gauze and ice packs in place. Anicteric  Neck: Supple. Unable to see JVP with post op dressings and ice. Carotids 2+ bilat; no bruits. No lymphadenopathy or thyromegaly appreciated. Cor: PMI laterally displaced. Regular rate & rhythm. 2/6 MR soft +S3.  Lungs: Clear Abdomen: Soft, nontender, nondistended. No hepatosplenomegaly. No bruits or masses. Good bowel sounds. Extremities: No cyanosis, clubbing, rash, edema. Warm Neuro: Alert & oriented x 3, cranial nerves grossly intact. moves all 4 extremities w/o difficulty. Affect pleasant.   Telemetry   NSR 70-90s, personally reviewed  EKG   No new tracings.   Labs     Basic Metabolic Panel: No results for input(s): NA, K, CL, CO2, GLUCOSE, BUN, CREATININE, CALCIUM, MG, PHOS in the last 168 hours.  Liver Function Tests: No results for input(s): AST, ALT, ALKPHOS, BILITOT, PROT, ALBUMIN in the last 168 hours. No results for input(s): LIPASE, AMYLASE in the last 168 hours. No results for input(s): AMMONIA in the last 168 hours.  CBC: Recent Labs  Lab 04/12/17 0644  WBC 9.9  HGB 11.2*  HCT 35.4*  MCV 76.1*  PLT 313     Cardiac Enzymes: No results for input(s): CKTOTAL, CKMB, CKMBINDEX, TROPONINI in the last 168 hours.  BNP: BNP (last 3 results) Recent Labs    05/03/16 0435 05/19/16 1554 07/27/16 1930  BNP 1,034.8* 470.0* 321.6*    ProBNP (last 3 results) No results for input(s): PROBNP in the last 8760 hours.   CBG: No results for input(s): GLUCAP in the last 168 hours.  Coagulation Studies: Recent Labs    04/12/17 0644  LABPROT 14.0  INR 1.09    Imaging:  No results found.   Patient Profile   David [name] is a 51 y.o. male with a PMH of ICM ('09 PCI to LAD, mRCA, '10 PCI to Lcx and '12 mRCA), HFrEF (EF ~20% for >5 years), HLD and HTN.  Undergoing VAD work up. Admitted today for planned dental procedure. Requires observation overnight with sedation and systolic CHF.   Assessment/Plan   1. Dental extraction - s/p extraction of tooth #s 2, 5-12, 17, and 20-29 with alveoloplastic and maxillary right buccal exostoses reductions. - Stable. Continue to follow for pain control and recovery from sedation.  - Advance diet as tolerated per Dr. Enrique Sack. OK to be eating scrambled eggs and apple sauce by tomorrow morning - Pt provided with Percocet from Dr. Enrique Sack.  2. Chronic systolic HF - Echo 25/05/39 with LVEF 20-25%, Mild MR, Mild/Mod MR, Severe LAE, Mildly reduced RV, PA peak pressure 56 mm Hg. - RHC 03/10/17 with volume overload and low output heart failure.  - Failed milrinone due to hypotension. Now on dobutamine 5 mcg/kg/min. Stable.  - Tunneled PICC stable on exam.  - Pt had extended meeting with VAD coordinator and HFSW this am. Blood Type O+ - Resume po meds as tolerated post op. May need to hold until tomorrow depending on how quickly he recovers from sedation.  -Continue lasix 80 mg daily.  - Continue losartan 12.5 mg daily. - Avoid spiro or digoxin with CKD.  -Continueivabradine 2.5mg  daily.  - No BB with low output.  - Could not tolerate Bidil, caused  hypotension. - Have discussed at length with Dr. Melvyn Novas at [facility]. Good candidate for advanced therapies. Plan for admission next week with [facility]HC and plan for LVAD tenatively 04/20/17.  3. CAD s/p PCI to mid RCA and PLOM with DES x 2 and DES to mid LAD in 1/18. -No s/s of ischemia.   - Continue ASA and statin  - Has been off Brilinta with epicardial lead and possible need for mechanical support 4. PAF - Maintaining NSR on po amiodarone.  - Continue eliquis 5 mg BID. - Hgb stable at 11.2 on admit. Will follow for post op bleeding.  5. Acute on CKD stage III - Will check BMET.  6. Tobacco abuse. -Stopped smoking in No change.  7. LBBB -s/p CRT-D upgrade in July 2018 with epicardial pacing. No change.  8. Frequent PVCs -Continue ranexa and No change.  9. Hypokalemia.  - Will check BMET today.   Shirley Friar, PA-C 04/12/2017, 11:38 AM  Advanced Heart Failure Team Pager [phone] (M-F; 7a - 4p)  Please contact [facility] for night-coverage after hours (4p -7a ) and weekends on [email or web address]  Patient seen and examined with the above-signed Advanced Practice Provider and/or Housestaff. I personally reviewed laboratory data, imaging studies and relevant notes. I independently examined the patient and formulated the important aspects of the plan. I have edited the note to reflect any of my changes or salient points. I have personally discussed the plan with the patient and/or family.  Mr. Rothbauer is a 51 y/o male with advanced systolic HF due to mixed CM who s pending LVAD placement next month. Earlier today he underwent successful  extraction of multiple teeth in anticipation of his upcoming LVAD placement. He is being observed overnight to ensure post-op stability and for pain management.   HF remains stable currently. Volume status looks good. Continue to hold anticoagulation overnight.   Glori Bickers, MD  6:21 PM

## 2017-04-12 NOTE — Anesthesia Postprocedure Evaluation (Signed)
Anesthesia Post Note  Patient: Seiji Wiswell  Procedure(s) Performed: Extraction of tooth #'s 2, 5-12, 17, 20-29 with alveoloplasty amd maxillary right buccal exostoses reductions. (N/A )     Patient location during evaluation: PACU Anesthesia Type: General Level of consciousness: awake and alert, oriented and patient cooperative Pain management: pain level controlled Vital Signs Assessment: post-procedure vital signs reviewed and stable Respiratory status: spontaneous breathing, nonlabored ventilation, respiratory function stable and patient connected to nasal cannula oxygen Cardiovascular status: blood pressure returned to baseline and stable Postop Assessment: no apparent nausea or vomiting Anesthetic complications: no    Last Vitals:  Vitals:   04/12/17 1300 04/12/17 1308  BP:    Pulse: 77   Resp: (!) 22   Temp:  (!) 36.1 C  SpO2: 92%     Last Pain:  Vitals:   04/12/17 1150  TempSrc:   PainSc: Asleep                 Shantella Blubaugh,E. Semisi Biela

## 2017-04-12 NOTE — Progress Notes (Signed)
[facility]  David [name] is an active HH and Home Dobutamine pt with [facility].  [facility] team will follow pt while here for dental extractions to support DC back home when ordered to ensure [facility] needs are met.   If patient discharges after hours, please call [phone].   [name] 04/12/2017, 5:14 PM

## 2017-04-12 NOTE — Discharge Instructions (Signed)

## 2017-04-12 NOTE — Progress Notes (Signed)
Patient's home IV dobutamine infusing.  Patient and spouse stated to this nurse that they prefer patient to remain on home dobutamine while in the hospital.  PA notified. Marcille Blanco, RN

## 2017-04-12 NOTE — Op Note (Signed)
OPERATIVE REPORT  Patient:            David [name] Date of Birth:  [birth year] MRN:                [id number]   DATE OF PROCEDURE:  04/12/2017  PREOPERATIVE DIAGNOSES: 1.  Ischemic cardiomyopathy 2.  Pre-LVAD placement dental protocol 3.  Chronic apical periodontitis 4.  Retained root segments 5.  Dental caries 6.  Chronic periodontitis 7.  Loose teeth  POSTOPERATIVE DIAGNOSES: 1.  Ischemic cardiomyopathy 2.  Pre-LVAD placement dental protocol 3.  Chronic apical periodontitis 4.  Retained root segments 5.  Dental caries 6.  Chronic periodontitis 7.  Loose teeth 8.  Maxillary right buccal exostoses  OPERATIONS: 1. Multiple extraction of tooth numbers 2, 5 -12, 17, and 20 through 29.   2. 4 Quadrants of alveoloplasty 3.  Maxillary right buccal exostoses reductions   SURGEON: Lenn Cal, DDS  ANESTHESIA: General anesthesia via oral endotracheal tube.  MEDICATIONS: 1. Ancef 2 g IV prior to invasive dental procedures. 2. Local anesthesia with a total utilization of 5 carpules each containing 34 mg of lidocaine with 0.017 mg of epinephrine as well as 2 carpules each containing 9 mg of bupivacaine with 0.009 mg of epinephrine.  SPECIMENS: There are 20 teeth that were discarded.  DRAINS: None  CULTURES: None  COMPLICATIONS: None   ESTIMATED BLOOD LOSS: 150 mLs.  INTRAVENOUS FLUIDS: 500 mLs of Lactated ringers solution.  INDICATIONS: The patient was recently diagnosed with ischemic cardiomyopathy.  A medically necessary dental consultation was then requested to evaluate poor dentition prior to LVAD placement..  The patient was examined and treatment planned for extraction of remaining teeth with alveoloplasty and pre-prosthetic surgery as needed in the operating room with general anesthesia.  This treatment plan was formulated to decrease the risks and complications associated with dental infection from affecting the patient's systemic health and the  anticipated LVAD placement.  OPERATIVE FINDINGS: Patient was examined operating room number 12.  The teeth were identified for extraction.  During the examination under general anesthesia, the maxillary right buccal exostoses were noted that would be reduced at the time of the surgery.  The patient was therefore noted to be affected by chronic apical periodontitis, dental caries, retained root segments, chronic periodontitis, loose teeth, and the presence of maxillary right buccal exostoses.  DESCRIPTION OF PROCEDURE: Patient was brought to the main operating room number 12. Patient was then placed in the supine position on the operating table.  General anesthesia was then induced per the anesthesia team. The patient was then prepped and draped in the usual manner for a dental medicine procedure. A timeout was performed. The patient was identified and procedures were verified. A throat pack was placed at this time. The oral cavity was then thoroughly examined with the findings noted above. The patient was then ready for dental medicine procedure as follows:  Local anesthesia was then administered sequentially with a total utilization of 5 carpules each containing 34 mg of lidocaine with 0.017 mg of epinephrine as well as 2 carpules  each containing 9 mg bupivacaine with 0.009 mg of epinephrine.  The Maxillary left and right quadrants first approached. Anesthesia was then delivered utilizing infiltration with lidocaine with epinephrine. A #15 blade incision was then made from the maxillary right tuberosity and extended to the distal of #14.  A  surgical flap was then carefully reflected.  The maxillary teeth were then subluxated with a series straight elevators.  Tooth  numbers 2, 5, 6, 7, 8, 9, 10, were then removed with a 150 forceps without complications.  The coronal aspect of tooth numbers 11 and 12 were then removed with a 150 forceps leaving the roots remaining.  A surgical handpiece and bur and  copious amounts of sterile water were then used to remove bone around the retained root segments.  The root segments were then elevated out with a series of cryers elevators.  Alveoloplasty was then performed utilizing rongeurs and bone file to help achieve primary closure.  The maxillary right buccal exostoses were then visualized and reduced utilizing a rongeurs and bone file appropriately.  The tissues were then approximated and trimmed appropriately.  The surgical sites were then irrigated with copious amounts sterile saline x2.  The maxillary right surgical site was then closed from the maxillary tuberosity and extended to the mesial #8 utilizing 3-0 chromic gut suture in a continuous interrupted suture technique x1.  The maxillary left surgical site was then closed from the distal #14 extent of the mesial #9 utilizing 3-0 chromic gut suture in a continuous interrupted suture technique x1.    At this point time, the mandibular quadrants were approached. The patient was given bilateral inferior alveolar nerve blocks and long buccal nerve blocks utilizing the bupivacaine with epinephrine. Further infiltration was then achieved utilizing the lidocaine with epinephrine. A 15 blade incision was then made from the distal of number 17 and extended to the distal of #31.  A surgical flap was then carefully reflected.  The lower teeth were then subluxated with a series straight elevators.  Appropriate amounts of buccal and interseptal bone were then removed utilizing a surgical handpiece and copious amount of sterile water around tooth #17, 20, 21, 22, 27, 28, and 29 appropriately. Tooth numbers 20 through 29 were then removed with a 151 forceps without complications.  Tooth #17 was then removed with a 23 forceps without complications. Alveoloplasty was then performed utilizing a rongeurs and bone file. The tissues were approximated and trimmed appropriately. The surgical sites were then irrigated with copious amounts  of sterile saline. A piece of surgicel was placed in extraction site #17.  The mandibular left surgical site was then closed from the distal of 17 and extended the mesial #24 utilizing 3-0 chromic gut suture in a continuous interrupted suture technique x1.  The mandibular right surgical site was then closed from the distal #31 extending the mesial #25 utilizing 3-0 chromic gut suture in a continuous interrupted suture technique x1.   At this point time, the entire mouth was irrigated with copious amounts of sterile saline. The patient was examined for complications, seeing none, the dental medicine procedure was deemed to be complete. The throat pack was removed at this time. An oral airway was then placed at the request of the anesthesia team. A series of 4 x 4 gauze moistened with Amicar 5% rinse were placed in the mouth to aid hemostasis. The patient was then handed over to the anesthesia team for final disposition. After an appropriate amount of time, the patient was extubated and taken to the postanesthsia care unit in good condition. All counts were correct for the dental medicine procedure.  Patient is to be kept overnight for observation with admission by the heart failure team.  Patient is to continue the Amicar 5% rinses postoperatively.  Patient is to rinse with 10 mL's every hour for the next 10 hours and a swish and spit manner.  The Eliquis therapy is  not to be restarted at this time per Cardiology note due to anticipated LVAD procedure next week with Dr. Cyndia Bent.   Lenn Cal, DDS.

## 2017-04-12 NOTE — Anesthesia Procedure Notes (Signed)
Procedure Name: Intubation Date/Time: 04/12/2017 8:59 AM Performed by: Orlie Dakin, CRNA Pre-anesthesia Checklist: Patient identified, Emergency Drugs available, Suction available, Patient being monitored and Timeout performed Patient Re-evaluated:Patient Re-evaluated prior to induction Oxygen Delivery Method: Circle system utilized Preoxygenation: Pre-oxygenation with 100% oxygen Induction Type: IV induction Ventilation: Mask ventilation without difficulty Laryngoscope Size: Miller and 3 Grade View: Grade I Tube type: Oral Tube size: 7.5 mm Number of attempts: 1 Airway Equipment and Method: Stylet Placement Confirmation: ETT inserted through vocal cords under direct vision,  positive ETCO2 and breath sounds checked- equal and bilateral Secured at: 23 cm Tube secured with: Tape Dental Injury: Teeth and Oropharynx as per pre-operative assessment

## 2017-04-12 NOTE — Progress Notes (Signed)
Called infection control  Stated pt is not to be on contact precautions  Informed RN and family

## 2017-04-12 NOTE — Anesthesia Procedure Notes (Addendum)
Arterial Line Insertion Start/End12/19/2018 8:15 AM, 04/12/2017 8:37 AM Performed by: Josephine Igo, CRNA, CRNA  Patient location: Pre-op. Preanesthetic checklist: patient identified, IV checked, risks and benefits discussed, surgical consent and monitors and equipment checked Lidocaine 1% used for infiltration and patient sedated Right, radial was placed Catheter size: 20 G Hand hygiene performed  and maximum sterile barriers used   Attempts: 1 Procedure performed without using ultrasound guided technique. Following insertion, dressing applied and Biopatch. Post procedure assessment: normal  Patient tolerated the procedure well with no immediate complications.

## 2017-04-12 NOTE — H&P (View-Only) (Signed)
Advanced Heart Failure Team History and Physical Note   Primary Cardiologist:  Dr. Haroldine Laws   Reason for Admission: Observation s/p dental extraction in CHF patient.  HPI:     David [name] is a 51 y.o. male with a PMH of ICM ('09 PCI to LAD, mRCA, '10 PCI to Lcx and '12 mRCA), HFrEF (EF ~20% for >5 years), HLD and HTN   Previously admitted from cath 03/10/17 which showed low output HF. Started on milrinone, but failed due to hypotension, so switched to dobutamine. Tunneled PICC placed for home inotrope support. HF meds adjusted as tolerated. Work up for advanced therapies started. VAD coordinator saw, and HFSW set up meeting. Sent home on dobutamine 5 mcg/kg/min via [facility]  Seen in HF clinic 03/23/17 post hospital. Had been doing well overall. Since, has been seen by Dr. Elsie Saas at [facility]. Thought to be good candidate to proceed with VAD with O+ type blood and likely long wait to transplant.   Pt has been stable on dobutamine at 5 mcg/kg/min.  Pt presented today for scheduled dental extractions pre-VAD implant next week. Asked by Dr Enrique Sack to observe overnight with required general anaesthesia and systolic CHF.   History limited with mouth packing s/p dental extraction(s)  Pt awake and alert now. Mouth packed with gauze so unable to speak, but nods to questioning. Has been doing well at home. Denies SOB or CP. Mouth pain well controlled.   Review of Systems: [y] = yes, [ ]  = no   General: Weight gain [ ] ; Weight loss [ ] ; Anorexia [ ] ; Fatigue [ ] ; Fever [ ] ; Chills [ ] ; Weakness [ ]   Cardiac: Chest pain/pressure [ ] ; Resting SOB [ ] ; Exertional SOB [y]; Orthopnea [ ] ; Pedal Edema [y]; Palpitations [ ] ; Syncope [ ] ; Presyncope [ ] ; Paroxysmal nocturnal dyspnea[ ]   Pulmonary: Cough [ ] ; Wheezing[ ] ; Hemoptysis[ ] ; Sputum [ ] ; Snoring [ ]   GI: Vomiting[ ] ; Dysphagia[ ] ; Melena[ ] ; Hematochezia [ ] ; Heartburn[ ] ; Abdominal pain [ ] ; Constipation [ ] ; Diarrhea [ ] ; BRBPR [ ]   GU:  Hematuria[ ] ; Dysuria [ ] ; Nocturia[ ]   Vascular: Pain in legs with walking [ ] ; Pain in feet with lying flat [ ] ; Non-healing sores [ ] ; Stroke [ ] ; TIA [ ] ; Slurred speech [ ] ;  Neuro: Headaches[ ] ; Vertigo[ ] ; Seizures[ ] ; Paresthesias[ ] ;Blurred vision [ ] ; Diplopia [ ] ; Vision changes [ ]   Ortho/Skin: Arthritis [y]; Joint pain [y]; Muscle pain [ ] ; Joint swelling [ ] ; Back Pain [ ] ; Rash [ ]   Psych: Depression[ ] ; Anxiety[ ]   Heme: Bleeding problems [ ] ; Clotting disorders [ ] ; Anemia [ ]   Endocrine: Diabetes [ ] ; Thyroid dysfunction[ ]   Home Medications Prior to Admission medications   Medication Sig Start Date End Date Taking? Authorizing Provider  amiodarone (PACERONE) 200 MG tablet Take 200 mg by mouth 2 (two) times daily. 09/23/16  Yes [provider]  apixaban (ELIQUIS) 5 MG TABS tablet Take 1 tablet (5 mg total) by mouth 2 (two) times daily. 11/11/16  Yes Gold, Wilder Glade, PA-C  aspirin 81 MG chewable tablet Chew 1 tablet (81 mg total) by mouth daily. 05/13/16  Yes Shirley Friar, PA-C  atorvastatin (LIPITOR) 40 MG tablet Take 1 tablet (40 mg total) by mouth daily at 6 PM. 03/14/17  Yes Tillery, Satira Mccallum, PA-C  DOBUTamine (DOBUTREX) 4-5 MG/ML-% infusion Inject 352.5 mcg/min into the vein continuous. 03/14/17  Yes Shirley Friar, PA-C  furosemide (  LASIX) 80 MG tablet Take 80 mg by mouth daily.   Yes [provider]  ivabradine (CORLANOR) 5 MG TABS tablet Take 0.5 tablets (2.5 mg total) by mouth 2 (two) times daily with a meal. 08/02/16  Yes Dessa Phi, DO  linaclotide (LINZESS) 290 MCG CAPS capsule Take 290 mcg by mouth daily as needed (constipation).   Yes [provider]  losartan (COZAAR) 25 MG tablet Take 0.5 tablets (12.5 mg total) by mouth daily. 03/15/17  Yes Shirley Friar, PA-C  Multiple Vitamins-Minerals (ADULT GUMMY) CHEW Chew 2 tablets by mouth daily.   Yes [provider]    Past Medical History: Past  Medical History:  Diagnosis Date  . AICD (automatic cardioverter/defibrillator) present   . Anemia   . CAD (coronary artery disease) 2009   AMI in 12/2007 with PCI to LAD, staged PCI to  mid/distal RCA, NSTEMI in 02/2009 with BMS to LCx  . CHF (congestive heart failure) ([place])   . Chronic kidney disease 11/03/2016   stage 3 kidney disease  . Dysrhythmia    "arrythmia problems at some point", "fatal rhythms"  . HLD (hyperlipidemia)   . HTN (hypertension)   . Ischemic cardiomyopathy    Admitted in 07/2010 with CHF exacerbation  . Myocardial infarction ([place])   . Seizures ([place]) 11/03/2016   one seizure in 04/2016 during cardiac event    Past Surgical History: Past Surgical History:  Procedure Laterality Date  . BIV ICD INSERTION CRT-D N/A 08/01/2016   Procedure: BiV ICD Insertion CRT-D;  Surgeon: Will Meredith Leeds, MD;  Location: [place] [facility];  Service: Cardiovascular;  Laterality: N/A;  . CARDIAC CATHETERIZATION N/A 05/05/2016   Procedure: Right/Left Heart Cath and Coronary Angiography;  Surgeon: Jolaine Artist, MD;  Location: [place] [facility];  Service: Cardiovascular;  Laterality: N/A;  . CARDIAC CATHETERIZATION N/A 05/09/2016   Procedure: Coronary Stent Intervention;  Surgeon: Peter M Martinique, MD;  Location: [name] [facility];  Service: Cardiovascular;  Laterality: N/A;  . CARDIAC CATHETERIZATION N/A 05/09/2016   Procedure: Intravascular Pressure Wire/FFR Study;  Surgeon: Peter M Martinique, MD;  Location: [place] [facility];  Service: Cardiovascular;  Laterality: N/A;  . ELECTROPHYSIOLOGY STUDY N/A 06/24/2016   Procedure: Electrophysiology Study;  Surgeon: Deboraha Sprang, MD;  Location: [place] [facility];  Service: Cardiovascular;  Laterality: N/A;  . EPICARDIAL PACING LEAD PLACEMENT Left 11/07/2016   Procedure: LV EPICARDIAL PACING LEAD PLACEMENT VIA LEFT MINI THORACOTOMY;  Surgeon: Gaye Pollack, MD;  Location: [place];  Service: Thoracic;  Laterality: Left;  . ICD IMPLANT N/A  06/24/2016   Procedure: POSSIBLE  ICD Implant;  Surgeon: Deboraha Sprang, MD;  Location: [place] [facility];  Service: Cardiovascular;  Laterality: N/A;  . IR FLUORO GUIDE CV MIDLINE PICC RIGHT  03/14/2017  . IR US GUIDE VASC ACCESS RIGHT  03/14/2017  . LEFT HEART CATHETERIZATION WITH CORONARY ANGIOGRAM N/A 03/13/2011   Procedure: LEFT HEART CATHETERIZATION WITH CORONARY ANGIOGRAM;  Surgeon: Lorretta Harp, MD;  Location: [facility] CATH LAB;  Service: Cardiovascular;  Laterality: N/A;  . RIGHT/LEFT HEART CATH AND CORONARY ANGIOGRAPHY N/A 03/10/2017   Procedure: RIGHT/LEFT HEART CATH AND CORONARY ANGIOGRAPHY;  Surgeon: Jolaine Artist, MD;  Location: [place] [facility];  Service: Cardiovascular;  Laterality: N/A;    Family History:  Family History  Problem Relation Age of Onset  . Coronary artery disease Father   . Hypertension Father   . Hypertension Sister   . Coronary  artery disease Brother   . Emphysema Brother   . Coronary artery disease Mother   . Hypertension Mother   . Emphysema Mother   . Hypertension Daughter   . Obesity Daughter   . Diabetes Unknown   . Hypertension Unknown   . Coronary artery disease Unknown     Social History: Social History   Socioeconomic History  . Marital status: Married    Spouse name: [name]  . Number of children: 3  . Years of education: None  . Highest education level: None  Social Needs  . Financial resource strain: None  . Food insecurity - worry: None  . Food insecurity - inability: None  . Transportation needs - medical: None  . Transportation needs - non-medical: None  Occupational History  . None  Tobacco Use  . Smoking status: Former Smoker    Packs/day: 0.20    Years: 30.00    Pack years: 6.00    Last attempt to quit: 04/29/2016    Years since quitting: 0.9  . Smokeless tobacco: Never Used  . Tobacco comment: Used to smoke cigarettes. Pt is currently smoking 3 black & mild cigars per day. He has cut down but never quit.    Substance and Sexual Activity  . Alcohol use: No  . Drug use: No  . Sexual activity: Yes  Other Topics Concern  . None  Social History Narrative  . None    Allergies:  Allergies  Allergen Reactions  . Plavix [Clopidogrel Bisulfate] Hives    Objective:    Vital Signs:   Temp:  [97.7 F (36.5 C)-97.9 F (36.6 C)] 97.7 F (36.5 C) (12/19 1050) Pulse Rate:  [71-90] 75 (12/19 1121) Resp:  [18-27] 22 (12/19 1121) BP: (102-121)/(58-79) 102/58 (12/19 1121) SpO2:  [92 %-97 %] 92 % (12/19 1121) Arterial Line BP: (116-136)/(49-63) 116/49 (12/19 1121) Weight:  [153 lb (69.4 kg)] 153 lb (69.4 kg) (12/19 0731)   Filed Weights   04/12/17 0721 04/12/17 0731  Weight: 153 lb (69.4 kg) 153 lb (69.4 kg)     Physical Exam     General:  Fatigued appearing. NAD HEENT: Face swollen. Mouth packed with gauze and ice packs in place. Anicteric  Neck: Supple. Unable to see JVP with post op dressings and ice. Carotids 2+ bilat; no bruits. No lymphadenopathy or thyromegaly appreciated. Cor: PMI laterally displaced. Regular rate & rhythm. 2/6 MR soft +S3.  Lungs: Clear Abdomen: Soft, nontender, nondistended. No hepatosplenomegaly. No bruits or masses. Good bowel sounds. Extremities: No cyanosis, clubbing, rash, edema. Warm Neuro: Alert & oriented x 3, cranial nerves grossly intact. moves all 4 extremities w/o difficulty. Affect pleasant.   Telemetry   NSR 70-90s, personally reviewed  EKG   No new tracings.   Labs     Basic Metabolic Panel: No results for input(s): NA, K, CL, CO2, GLUCOSE, BUN, CREATININE, CALCIUM, MG, PHOS in the last 168 hours.  Liver Function Tests: No results for input(s): AST, ALT, ALKPHOS, BILITOT, PROT, ALBUMIN in the last 168 hours. No results for input(s): LIPASE, AMYLASE in the last 168 hours. No results for input(s): AMMONIA in the last 168 hours.  CBC: Recent Labs  Lab 04/12/17 0644  WBC 9.9  HGB 11.2*  HCT 35.4*  MCV 76.1*  PLT 313     Cardiac Enzymes: No results for input(s): CKTOTAL, CKMB, CKMBINDEX, TROPONINI in the last 168 hours.  BNP: BNP (last 3 results) Recent Labs    05/03/16 0435 05/19/16 1554 07/27/16 1930  BNP 1,034.8* 470.0* 321.6*    ProBNP (last 3 results) No results for input(s): PROBNP in the last 8760 hours.   CBG: No results for input(s): GLUCAP in the last 168 hours.  Coagulation Studies: Recent Labs    04/12/17 0644  LABPROT 14.0  INR 1.09    Imaging:  No results found.   Patient Profile   David [name] is a 51 y.o. male with a PMH of ICM ('09 PCI to LAD, mRCA, '10 PCI to Lcx and '12 mRCA), HFrEF (EF ~20% for >5 years), HLD and HTN.  Undergoing VAD work up. Admitted today for planned dental procedure. Requires observation overnight with sedation and systolic CHF.   Assessment/Plan   1. Dental extraction - s/p extraction of tooth #s 2, 5-12, 17, and 20-29 with alveoloplastic and maxillary right buccal exostoses reductions. - Stable. Continue to follow for pain control and recovery from sedation.  - Advance diet as tolerated per Dr. Enrique Sack. OK to be eating scrambled eggs and apple sauce by tomorrow morning - Pt provided with Percocet from Dr. Enrique Sack.  2. Chronic systolic HF - Echo 29/79/89 with LVEF 20-25%, Mild MR, Mild/Mod MR, Severe LAE, Mildly reduced RV, PA peak pressure 56 mm Hg. - RHC 03/10/17 with volume overload and low output heart failure.  - Failed milrinone due to hypotension. Now on dobutamine 5 mcg/kg/min. Stable.  - Tunneled PICC stable on exam.  - Pt had extended meeting with VAD coordinator and HFSW this am. Blood Type O+ - Resume po meds as tolerated post op. May need to hold until tomorrow depending on how quickly he recovers from sedation.  -Continue lasix 80 mg daily.  - Continue losartan 12.5 mg daily. - Avoid spiro or digoxin with CKD.  -Continueivabradine 2.5mg  daily.  - No BB with low output.  - Could not tolerate Bidil, caused  hypotension. - Have discussed at length with Dr. Melvyn Novas at [facility]. Good candidate for advanced therapies. Plan for admission next week with RHC and plan for LVAD tenatively 04/20/17.  3. CAD s/p PCI to mid RCA and PLOM with DES x 2 and DES to mid LAD in 1/18. -No s/s of ischemia.   - Continue ASA and statin  - Has been off Brilinta with epicardial lead and possible need for mechanical support 4. PAF - Maintaining NSR on po amiodarone.  - Continue eliquis 5 mg BID. - Hgb stable at 11.2 on admit. Will follow for post op bleeding.  5. Acute on CKD stage III - Will check BMET.  6. Tobacco abuse. -Stopped smoking in No change.  7. LBBB -s/p CRT-D upgrade in July 2018 with epicardial pacing. No change.  8. Frequent PVCs -Continue ranexa and No change.  9. Hypokalemia.  - Will check BMET today.   Shirley Friar, PA-C 04/12/2017, 11:38 AM  Advanced Heart Failure Team Pager [phone] (M-F; 7a - 4p)  Please contact [facility] for night-coverage after hours (4p -7a ) and weekends on [email or web address]  Patient seen and examined with the above-signed Advanced Practice Provider and/or Housestaff. I personally reviewed laboratory data, imaging studies and relevant notes. I independently examined the patient and formulated the important aspects of the plan. I have edited the note to reflect any of my changes or salient points. I have personally discussed the plan with the patient and/or family.  Mr. 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 Earlier today he underwent successful  extraction of multiple teeth in anticipation of his upcoming LVAD placement. He is being observed overnight to ensure post-op stability and for pain management.   HF remains stable currently. Volume status looks good. Continue to hold anticoagulation overnight.   Glori Bickers, MD  6:21 PM

## 2017-04-12 NOTE — H&P (Signed)
04/12/2017  Patient:            David [name] Date of Birth:  27-Jun-1965 MRN:                [id number]   BP 109/69   Pulse 71   Temp 97.9 F (36.6 C) (Oral)   Resp 18   Ht 5\' 5"  (1.651 m)   Wt 153 lb (69.4 kg)   SpO2 93%   BMI 25.46 kg/m    David [name] is a 51 year old male that presents for extraction of remaining teeth with alveoloplasty in the operating room with general anesthesia.  Patient denies any acute medical or dental changes.  Please see note from Dr. Cyndia Bent dated 04/06/2017 to act as the H&P for dental operating room procedure.  Lenn Cal, DDS       Cardiothoracic Surgery Consultation     PCP is Raina Mina., MD  Referring Provider is Deboraha Sprang, MD          Chief Complaint    Patient presents with    .   New Patient (Initial Visit)            LVAD evaluation          HPI:     The patient is a 51 year old gentleman with blood type O+ with hypertension, hyperlipidemia, stage III chronic kidney disease, and coronary artery disease with ischemic cardiomyopathy.  He had an anterior MI in 2009 with PCI of the LAD.    He presented to [facility] in January 2018 with worsening shortness of breath with exertion and orthopnea.  He was admitted with atrial fibrillation and atrial flutter and subsequently had a bradycardia/PEA arrest requiring intubation and pressors.  He was transferred to [facility] and his pressors were weaned.  He was extubated.  An echocardiogram at that time showed an ejection fraction of 15-20%.  Cardiac catheterization on 05/05/2016 showed severe three-vessel coronary disease with a high-grade stenosis of the RCA and LAD.  His PCWP was 37 at that time and he was diuresed for optimization and subsequently he underwent stenting of the proximal to mid RCA and the posterior lateral OM with drug-eluting stents as well as successful stenting of the mid LAD with a drug-eluting stent on 05/09/2016.  He was  admitted again in April 2018 with palpitations and weakness and underwent placement of an ICD.  The left ventricular lead could not be placed and therefore he subsequently underwent a small left thoracotomy with insertion of 2 LV epicardial leads and revision of his CRT system to BiV by me on 11/07/2016.  He had a fair amount of pain postoperatively which I followed for a few months.  This gradually completely resolved.  When I last saw him on 02/01/2017 he was still having some shortness of breath with exertion.  He was subsequently admitted after catheterization on 03/10/2017 with low output heart failure.  His cath data showed:     Ao = 95/55 (71)  LV = 93/32  RA = 12  RV = 62/17  PA = 60/27 (37)  PCW = 22 (v= 35)  Fick cardiac output/index = 3.0/1.6  PVR =5.0 WU  SVR = 1580  Ao sat = 98%  PA sat = 50%, 52%  PaPI = 2/75  RA/PCW = 0.54  RA/LVEDP = 0.38        He was started on milrinone but had hypotension and was switched to dobutamine.  He had a tunneled  PICC line placed for home dobutamine therapy.  He is on 5 mcg/kg/min and says that he feels much better.  He presently denies any shortness of breath and feels like his energy level has improved.  He was seen by Dr. Haroldine Laws recently and was referred to [facility] for consideration of transplantation.  His wife said that heart transplantation was not going to be financially feasible at this time and she was told by the [facility] transplant team that he would be best proceeding ahead with LVAD implant.          Past Medical History:    Diagnosis   Date    .   AICD (automatic cardioverter/defibrillator) present        .   CAD (coronary artery disease)   2009        AMI in 12/2007 with PCI to LAD, staged PCI to  mid/distal RCA, NSTEMI in 02/2009 with BMS to LCx    .   CHF (congestive heart failure) ([place])        .   Chronic kidney disease   11/03/2016        stage 3 kidney disease    .    HLD (hyperlipidemia)        .   HTN (hypertension)        .   Ischemic cardiomyopathy            Admitted in 07/2010 with CHF exacerbation    .   Myocardial infarction ([place])        .   Seizures ([place])   11/03/2016        one seizure in 04/2016 during cardiac event    No history of GI bleeding           Past Surgical History:    Procedure   Laterality   Date    .   BIV ICD INSERTION CRT-D   N/A   08/01/2016        Procedure: BiV ICD Insertion CRT-D;  Surgeon: Will Meredith Leeds, MD;  Location: [place] [facility];  Service: Cardiovascular;  Laterality: N/A;    .   CARDIAC CATHETERIZATION   N/A   05/05/2016        Procedure: Right/Left Heart Cath and Coronary Angiography;  Surgeon: Jolaine Artist, MD;  Location: [place] [facility];  Service: Cardiovascular;  Laterality: N/A;    .   CARDIAC CATHETERIZATION   N/A   05/09/2016        Procedure: Coronary Stent Intervention;  Surgeon: Peter M Martinique, MD;  Location: [place] [facility];  Service: Cardiovascular;  Laterality: N/A;    .   CARDIAC CATHETERIZATION   N/A   05/09/2016        Procedure: Intravascular Pressure Wire/FFR Study;  Surgeon: Peter M Martinique, MD;  Location: [place] [facility];  Service: Cardiovascular;  Laterality: N/A;    .   ELECTROPHYSIOLOGY STUDY   N/A   06/24/2016        Procedure: Electrophysiology Study;  Surgeon: Deboraha Sprang, MD;  Location: [place] [facility];  Service: Cardiovascular;  Laterality: N/A;    .   EPICARDIAL PACING LEAD PLACEMENT   Left   11/07/2016        Procedure: LV EPICARDIAL PACING LEAD PLACEMENT VIA LEFT MINI THORACOTOMY;  Surgeon: Gaye Pollack, MD;  Location: [place];  Service: Thoracic;  Laterality: Left;    .   ICD IMPLANT   N/A  06/24/2016        Procedure: POSSIBLE  ICD Implant;  Surgeon: Deboraha Sprang, MD;  Location: [place] [facility];  Service: Cardiovascular;   Laterality: N/A;    .   IR FLUORO GUIDE CV MIDLINE PICC RIGHT       03/14/2017    .   IR US GUIDE VASC ACCESS RIGHT       03/14/2017    .   LEFT HEART CATHETERIZATION WITH CORONARY ANGIOGRAM   N/A   03/13/2011        Procedure: LEFT HEART CATHETERIZATION WITH CORONARY ANGIOGRAM;  Surgeon: Lorretta Harp, MD;  Location[facility];  Service: Cardiovascular;  Laterality: N/A;    .   RIGHT/LEFT HEART CATH AND CORONARY ANGIOGRAPHY   N/A   03/10/2017        Procedure: RIGHT/LEFT HEART CATH AND CORONARY ANGIOGRAPHY;  Surgeon: Jolaine Artist, MD;  Location: [place] [facility];  Service: Cardiovascular;  Laterality: N/A;                Family History    Problem   Relation   Age of Onset    .   Coronary artery disease   Father        .   Hypertension   Father        .   Hypertension   Sister        .   Coronary artery disease   Brother        .   Emphysema   Brother        .   Coronary artery disease   Mother        .   Hypertension   Mother        .   Emphysema   Mother        .   Hypertension   Daughter        .   Obesity   Daughter        .   Diabetes   Unknown        .   Hypertension   Unknown        .   Coronary artery disease   Unknown              Social History   Social History             Tobacco Use    .   Smoking status:   Former Smoker            Packs/day:   0.20            Years:   30.00            Pack years:   6.00            Last attempt to quit:   04/29/2016            Years since quitting:   0.9    .   Smokeless tobacco:   Never Used    .   Tobacco comment: Used to smoke cigarettes. Pt is currently smoking 3 black & mild cigars per day. He has cut down but never quit.    Substance Use Topics    .   Alcohol use:   No    .   Drug  use:   No                 Current Outpatient  Medications    Medication   Sig   Dispense   Refill    .   amiodarone (PACERONE) 200 MG tablet   Take 200 mg by mouth 2 (two) times daily.            Marland Kitchen   apixaban (ELIQUIS) 5 MG TABS tablet   Take 1 tablet (5 mg total) by mouth 2 (two) times daily.   60 tablet   1    .   aspirin 81 MG chewable tablet   Chew 1 tablet (81 mg total) by mouth daily.   30 tablet   6    .   atorvastatin (LIPITOR) 40 MG tablet   Take 1 tablet (40 mg total) by mouth daily at 6 PM.   30 tablet   6    .   DOBUTamine (DOBUTREX) 4-5 MG/ML-% infusion   Inject 352.5 mcg/min into the vein continuous.   250 mL   0    .   furosemide (LASIX) 80 MG tablet   Take 80 mg by mouth daily.            .   ivabradine (CORLANOR) 5 MG TABS tablet   Take 0.5 tablets (2.5 mg total) by mouth 2 (two) times daily with a meal.   60 tablet   0    .   linaclotide (LINZESS) 290 MCG CAPS capsule   Take 290 mcg by mouth daily as needed (constipation).            Marland Kitchen   losartan (COZAAR) 25 MG tablet   Take 0.5 tablets (12.5 mg total) by mouth daily.   15 tablet   6    .   Multiple Vitamins-Minerals (ADULT GUMMY) CHEW   Chew 2 tablets by mouth daily.                No current facility-administered medications for this visit.                Allergies    Allergen   Reactions    .   Plavix [Clopidogrel Bisulfate]   Hives          Review of Systems   Constitutional: Positive for activity change. Negative for fatigue and fever.   HENT: Positive for dental problem.    Eyes: Negative.    Respiratory: Positive for cough and shortness of breath.    Cardiovascular: Negative for chest pain, palpitations and leg swelling.   Gastrointestinal: Negative.    Endocrine: Negative.    Genitourinary: Negative.    Musculoskeletal: Negative.    Skin: Negative.     Allergic/Immunologic: Negative.    Neurological: Positive for seizures.   Hematological: Negative.    Psychiatric/Behavioral: The patient is nervous/anxious.          BP 114/71 (BP Location: Right Arm, Patient Position: Sitting, Cuff Size: Normal)   Pulse 79   Ht 5\' 5"  (1.651 m)   Wt 155 lb (70.3 kg)   SpO2 96%   BMI 25.79 kg/m   Physical Exam   Constitutional: He is oriented to person, place, and time. He appears well-developed and well-nourished. No distress.   HENT:   Head: Normocephalic and atraumatic.   Mouth/Throat: Oropharynx is clear and moist.  Teeth in poor condition   Eyes: Conjunctivae are normal. Pupils are equal, round, and reactive to light.   Neck: Normal range of motion. Neck supple. No JVD present. No thyromegaly present.   Cardiovascular: Normal rate, regular rhythm,  normal heart sounds and intact distal pulses.   No murmur heard.  Pulmonary/Chest: Effort normal and breath sounds normal. No respiratory distress. He has no rales.  Left thoracotomy scar   Abdominal: Soft. Bowel sounds are normal.  Musculoskeletal: Normal range of motion. He exhibits no edema.  Lymphadenopathy:    He has no cervical adenopathy.  Neurological: He is alert and oriented to person, place, and time. He has normal strength. No cranial nerve deficit or sensory deficit.   Skin: Skin is warm and dry.  Psychiatric: He has a normal mood and affect.            Diagnostic Tests:     Physicians         Panel Physicians    Referring Physician    Case Authorizing Physician     Bensimhon, Shaune Pascal, MD (Primary)            Procedures        RIGHT/LEFT HEART CATH AND CORONARY ANGIOGRAPHY    Conclusion           .Dist LAD lesion is 80% stenosed.   Colon Flattery Cx to Mid Cx lesion is 40% stenosed.   .Dist RCA lesion is 40% stenosed.   .Previously placed Prox RCA to Mid RCA stent (unknown type) is widely patent.   .Mid RCA lesion  is 40% stenosed.   .Previously placed Post Atrio stent (unknown type) is widely patent.   .Acute Mrg lesion is 99% stenosed.   .Previously placed Prox LAD to Dist LAD stent (unknown type) is widely patent.   .Prox LAD lesion is 40% stenosed.      Findings:     Ao = 95/55 (71)  LV = 93/32  RA = 12  RV = 62/17  PA = 60/27 (37)  PCW = 22 (v= 35)  Fick cardiac output/index = 3.0/1.6  PVR =5.0 WU  SVR = 1580  Ao sat = 98%  PA sat = 50%, 52%  PaPI = 2/75  RA/PCW = 0.54  RA/LVEDP = 0.38     Assessment:  1. Patent LAD and RCA stents. Non obstructive CAD elsehwere  2. Volume overload with low-output physiology     Plan/Discussion:     Admit for IV diuresis with milrinone support and start work-up for advanced therapies     Glori Bickers, MD   9:16 AM             Indications        Acute on chronic systolic (congestive) heart failure (HCC) [I50.23 (ICD-10-CM)]    Procedural Details/Technique        Technical Details   The risks and indication of the procedure were explained. Consent was signed and placed on the chart. An appropriate timeout was taken prior to the procedure.   The right AC fossa was prepped and draped in the routine sterile fashion and anesthetized with 1% local lidocaine. We were unable to wire the pre-existing PIV in the right AC. So using u/s guidance we identified the brachial vein and cannulated with a micro-puncture set. The micropuncture sheath was then exchanged over a wire for a 5 FR venous sheath using a modified Seldinger technique. A standard Swan-Ganz catheter was used for the procedure.   After a normal Allen's test was confirmed, the right wrist was prepped and draped in the routine sterile fashion and anesthetized with 1% local lidocaine. A 5 FR arterial sheath was then placed in the right radial artery using a modified  Seldinger technique. 3mg  IV verapamil was given through the sheath.  Systemic heparin was administered. Standard catheters including a JL 3.5 and a JR 4 were used. All catheter exchanges were made over a wire.   Estimated blood loss <50 mL.  During this procedure the patient was administered the following to achieve and maintain moderate conscious sedation: Versed 1 mg, Fentanyl 25 mcg, while the patient's heart rate, blood pressure, and oxygen saturation were continuously monitored. The period of conscious sedation was 42 minutes, of which I was present face-to-face 100% of this time.    Coronary Findings      Diagnostic   Dominance: Right     Left Anterior Descending    Prox LAD lesion 40% stenosed    Prox LAD lesion is 40% stenosed.    Prox LAD to Dist LAD lesion 0% stenosed    Previously placed Prox LAD to Dist LAD stent (unknown type) is widely patent.    Dist LAD lesion 80% stenosed    Dist LAD lesion is 80% stenosed.    Left Circumflex    Ost Cx to Mid Cx lesion 40% stenosed    Ost Cx to Mid Cx lesion is 40% stenosed.    Right Coronary Artery    Prox RCA to Mid RCA lesion 0% stenosed    Previously placed Prox RCA to Mid RCA stent (unknown type) is widely patent.    Mid RCA lesion 40% stenosed    Mid RCA lesion is 40% stenosed.    Dist RCA lesion 40% stenosed    Dist RCA lesion is 40% stenosed. The lesion was previously treated.    Acute Marginal Branch    Acute Mrg lesion 99% stenosed    Acute Mrg lesion is 99% stenosed.    Right Posterior Atrioventricular Branch    Post Atrio lesion 0% stenosed    Previously placed Post Atrio stent (unknown type) is widely patent.    Intervention      No interventions have been documented.   Coronary Diagrams        Diagnostic Diagram        untitled image       Implants              No implant documentation for this case.    MERGE Images         Show images for CARDIAC CATHETERIZATION      Link to Procedure Log         Procedure Log       Hemo Data              Most Recent Value     Fick Cardiac Output   2.99 L/min    Fick Cardiac Output Index   1.65 (L/min)/BSA    Aortic Mean Gradient   2.7 mmHg    Aortic Peak Gradient   1 mmHg    Aortic Valve Area   >3.50    Aortic Value Area Index   1.93 cm2/BSA    RA A Wave   16 mmHg    RA V Wave   13 mmHg    RA Mean   12 mmHg    RV Systolic Pressure   62 mmHg    RV Diastolic Pressure   4 mmHg    RV EDP   17 mmHg    PA Systolic Pressure   60 mmHg    PA Diastolic Pressure   27 mmHg    PA Mean  37 mmHg    PW A Wave   29 mmHg    PW V Wave   35 mmHg    PW Mean   22 mmHg    AO Systolic Pressure   92 mmHg    AO Diastolic Pressure   60 mmHg    AO Mean   73 mmHg    LV Systolic Pressure   93 mmHg    LV Diastolic Pressure   19 mmHg    LV EDP   32 mmHg    Arterial Occlusion Pressure Extended Systolic Pressure   11 mmHg    Arterial Occlusion Pressure Extended Diastolic Pressure   0 mmHg    Arterial Occlusion Pressure Extended Mean Pressure   2 mmHg    Left Ventricular Apex Extended Systolic Pressure   87 mmHg    Left Ventricular Apex Extended Diastolic Pressure   21 mmHg    Left Ventricular Apex Extended EDP Pressure   33 mmHg    QP/QS   1    TPVR Index   22.4 HRUI    TSVR Index   43 HRUI    PVR SVR Ratio   0.25    TPVR/TSVR Ratio   0.52                                     *[place]*                    *[facility]*                          [address],  [address]                             [phone]     -------------------------------------------------------------------  Transthoracic Echocardiography     Patient:    David [name], David [name]  MR #:       [id number]  Study Date: 03/11/2017  Gender:     M  Age:         16  Height:     165.1 cm  Weight:     70.5 kg  BSA:        1.81 m^2  Pt. Status:  Room:       2C12C      ADMITTING    Bensimhon, Daniel   ATTENDING    Bensimhon, Lanae Crumbly     Bensimhon, Glynis Smiles    Bensimhon, Daniel   PERFORMING   Ch[facility], Inpatient   SONOGRAPHER  Dance, Tiffany     cc:     -------------------------------------------------------------------  LV EF: 20% -   25%     -------------------------------------------------------------------  Indications:      CHF - 428.0.     -------------------------------------------------------------------  History:   PMH:   Coronary artery disease.  Congestive heart  failure.  PMH:  Seizures. Chronic Kidney Disease. Defibrillator.  Ischemic Cardiomyopathy.  Myocardial infarction.  Risk factors:  Hypertension. Dyslipidemia.     -------------------------------------------------------------------  Study Conclusions     - Left ventricle: The cavity size was moderately dilated. Wall    thickness was normal. Systolic function was severely reduced. The  estimated ejection fraction was in the range of 20% to 25%.    Severe diffuse hypokinesis with distinct regional wall motion    abnormalities. Akinesis and scarring of the anterolateral and    inferolateral myocardium; consistent with infarction in the    distribution of the left circumflex coronary artery. No evidence    of thrombus.  - Ventricular septum: Septal motion showed paradox. These changes    are consistent with right ventricular pacing.  - Aortic valve: There was mild regurgitation.  - Mitral valve: There was mild to moderate regurgitation directed    centrally.  - Left atrium: The atrium was severely dilated.  - Right ventricle: Systolic function was mildly reduced.  - Pulmonary arteries: Systolic pressure was moderately increased.    PA peak pressure: 56 mm Hg (S).      -------------------------------------------------------------------  Study data:  Comparison was made to the study of 07/29/2016.  Study  status:  Routine.  Procedure:  The patient reported no pain pre or  post test. Transthoracic echocardiography. Image quality was  adequate.  Study completion:  There were no complications.  Transthoracic echocardiography.  M-mode, complete 2D, spectral  Doppler, and color Doppler.  Birthdate:  Patient birthdate:  Sep 06, 1965.  Age:  Patient is 51 yr old.  Sex:  Gender: male.  BMI: 25.9 kg/m^2.  Blood pressure:     107/72  Patient status:  Inpatient.  Study date:  Study date: 03/11/2017. Study time: 09:32  AM.  Location:  Bedside.     -------------------------------------------------------------------     -------------------------------------------------------------------  Left ventricle:  The cavity size was moderately dilated. Wall  thickness was normal. Systolic function was severely reduced. The  estimated ejection fraction was in the range of 20% to 25%.  Severe  diffuse hypokinesis with distinct regional wall motion  abnormalities.  No evidence of thrombus.  Regional wall motion  abnormalities:   Akinesis and scarring of the anterolateral and  inferolateral myocardium; consistent with infarction in the  distribution of the left circumflex coronary artery. The study was  not technically sufficient to allow evaluation of LV diastolic  dysfunction due to atrial fibrillation.     -------------------------------------------------------------------  Aortic valve:   Structurally normal valve. Trileaflet. Cusp  separation was normal.  Doppler:  Transvalvular velocity was within  the normal range. There was no stenosis. There was mild  regurgitation.     -------------------------------------------------------------------  Aorta:  Aortic root: The aortic root was normal in size.  Ascending aorta: The ascending aorta  was normal in size.     -------------------------------------------------------------------  Mitral valve:   Structurally normal valve.   Leaflet separation was  normal.  Doppler:  Transvalvular velocity was within the normal  range. There was no evidence for stenosis. There was mild to  moderate regurgitation directed centrally.    Valve area by  pressure half-time: 5.12 cm^2. Indexed valve area by pressure  half-time: 2.83 cm^2/m^2.    Peak gradient (D): 6 mm Hg.     -------------------------------------------------------------------  Left atrium:  The atrium was severely dilated.     -------------------------------------------------------------------  Right ventricle:  The cavity size was normal. Pacer wire or  catheter noted in right ventricle. Systolic function was mildly  reduced.     -------------------------------------------------------------------  Ventricular septum:   Septal motion showed paradox. These changes  are consistent with right ventricular pacing.     -------------------------------------------------------------------  Pulmonic valve:    Structurally normal valve.   Cusp separation was  normal.  Doppler:  Transvalvular  velocity was within the normal  range. There was mild regurgitation.     -------------------------------------------------------------------  Tricuspid valve:   Structurally normal valve.   Leaflet separation  was normal.  Doppler:  Transvalvular velocity was within the normal  range. There was mild regurgitation.     -------------------------------------------------------------------  Pulmonary artery:   Systolic pressure was moderately increased.     -------------------------------------------------------------------  Right atrium:  The atrium was normal in size. Pacer wire or  catheter noted in right atrium.     -------------------------------------------------------------------  Pericardium:   There was no pericardial effusion.     -------------------------------------------------------------------  Systemic veins:  Inferior vena cava: The vessel was dilated. The respirophasic  diameter changes were blunted (< 50%), consistent with elevated  central venous pressure.     -------------------------------------------------------------------  Measurements      Left ventricle                           Value          Reference   LV ID, ED, PLAX chordal          (H)     67.7  mm       43 - 52   LV ID, ES, PLAX chordal          (H)     62.74 mm       23 - 38   LV fx shortening, PLAX chordal   (L)     7     %        >=29   LV PW thickness, ED                      10    mm       ----------   IVS/LV PW ratio, ED                      0.9            <=1.3      Ventricular septum                       Value          Reference   IVS thickness, ED                        9     mm       ----------      LVOT                                     Value          Reference   LVOT ID, S                               20    mm       ----------   LVOT area                                3.14  cm^2     ----------      Aortic valve  Value          Reference   Aortic regurg pressure half-time         309   ms       ----------      Aorta                                    Value          Reference   Aortic root ID, ED                       32    mm       ----------   Ascending aorta ID, A-P, S               29    mm       ----------      Left atrium                              Value          Reference   LA ID, A-P, ES                           54    mm       ----------   LA ID/bsa, A-P                   (H)     2.98  cm/m^2   <=2.2   LA volume, S                             106   ml       ----------   LA volume/bsa, S                         58.5  ml/m^2   ----------   LA volume, ES, 1-p A4C                   109   ml       ----------    LA volume/bsa, ES, 1-p A4C               60.2  ml/m^2   ----------   LA volume, ES, 1-p A2C                   101   ml       ----------   LA volume/bsa, ES, 1-p A2C               55.8  ml/m^2   ----------      Mitral valve                             Value          Reference   Mitral E-wave peak velocity              122   cm/s     ----------   Mitral deceleration time                 151   ms       150 - 230  Mitral pressure half-time                44    ms       ----------   Mitral peak gradient, D                  6     mm Hg    ----------   Mitral valve area, PHT, DP               5.12  cm^2     ----------   Mitral valve area/bsa, PHT, DP           2.83  cm^2/m^2 ----------   Mitral maximal regurg velocity,          360   cm/s     ----------   PISA   Mitral regurg VTI, PISA                  111   cm       ----------   Mitral ERO, PISA                         0.17  cm^2     ----------   Mitral regurg volume, PISA               19    ml       ----------      Pulmonary arteries                       Value          Reference   PA pressure, S, DP               (H)     56    mm Hg    <=30      Tricuspid valve                          Value          Reference   Tricuspid regurg peak velocity           320   cm/s     ----------   Tricuspid peak RV-RA gradient            41    mm Hg    ----------      Right atrium                             Value          Reference   RA ID, S-I, ES, A4C              (H)     49.9  mm       34 - 49   RA area, ES, A4C                         18.4  cm^2     8.3 - 19.5   RA volume, ES, A/L                       54.2  ml       ----------   RA volume/bsa, ES, A/L                   29.9  ml/m^2   ----------      Systemic veins                           Value          Reference   Estimated CVP                            15    mm Hg    ----------      Right ventricle                          Value          Reference   RV ID,  minor axis, ED, A4C base          34    mm       ----------   RV ID, minor axis, ED, A4C mid           21    mm       ----------   RV ID, major axis, ED, A4C       (H)     98    mm       55 - 91   TAPSE                                    17.3  mm       ----------   RV pressure, S, DP               (H)     56    mm Hg    <=30      Pulmonic valve                           Value          Reference   Pulmonic regurg velocity, ED             148   cm/s     ----------   Pulmonic regurg gradient, ED             9     mm Hg    ----------     Legend:  (L)  and  (H)  mark values outside specified reference range.     -------------------------------------------------------------------  Prepared and Electronically Authenticated by     Sanda Klein, MD  2018-11-17T14:53:08     CLINICAL DATA:  Heart failure.     EXAM:  CT CHEST, ABDOMEN AND PELVIS WITHOUT CONTRAST     TECHNIQUE:  Multidetector CT imaging of the chest, abdomen and pelvis was  performed following the standard protocol without IV contrast.     COMPARISON:  CT scan of April 29, 2016.     FINDINGS:  CT CHEST FINDINGS     Cardiovascular: Atherosclerosis of thoracic aorta is noted without  aneurysm formation. Mild cardiomegaly is noted. No pericardial  effusion is noted. Left-sided pacemaker is in grossly good position.  Right-sided PICC line is noted with distal tip at cavoatrial  junction. Coronary artery stents are noted.     Mediastinum/Nodes: 2 cm right peritracheal lymph node is noted which  is not significantly changed. Stable mildly enlarged anterior  mediastinal lymph nodes are noted. Thyroid gland, trachea, and  esophagus demonstrate no significant findings.     Lungs/Pleura: No pneumothorax is noted. Mild emphysematous disease  is noted in the upper lobes bilaterally. Mild left pleural effusion  is noted with adjacent subsegmental atelectasis. Stable 4 mm  nodule  is noted in right upper lobe best seen on image number 63 of series  5.     Musculoskeletal: No chest wall mass or suspicious bone lesions  identified.     CT ABDOMEN PELVIS FINDINGS     Hepatobiliary: No focal liver abnormality is seen. No gallstones,  gallbladder wall thickening, or biliary dilatation.     Pancreas: Unremarkable. No pancreatic ductal dilatation or  surrounding inflammatory changes.     Spleen: Normal in size without focal abnormality.     Adrenals/Urinary Tract: Adrenal glands are unremarkable. Kidneys are  normal, without renal calculi, focal lesion, or hydronephrosis.  Bladder is unremarkable.     Stomach/Bowel: Stomach is within normal limits. Appendix appears  normal. No evidence of bowel wall thickening, distention, or  inflammatory changes.     Vascular/Lymphatic: Aortic atherosclerosis. No enlarged abdominal or  pelvic lymph nodes.     Reproductive: Prostate is unremarkable.     Other: No abdominal wall hernia or abnormality. No abdominopelvic  ascites.     Musculoskeletal: No acute or significant osseous findings.     IMPRESSION:  Mild cardiomegaly.     Atherosclerosis of thoracic and abdominal aorta is noted without  aneurysm formation.     Grossly stable mediastinal adenopathy is noted of indeterminate  etiology.     Stable 4 mm nodule noted in right upper lobe.     Mild emphysematous disease is noted in both upper lobes.     No acute abnormality seen in the abdomen or pelvis.        Electronically Signed    By: Marijo Conception, M.D.    On: 03/13/2017 14:45              Ref Range & Units   3wk ago    FVC-Pre   L   2.90     FVC-%Pred-Pre   %   84     FEV1-Pre   L   1.90     FEV1-%Pred-Pre   %   70     FEV6-Pre   L   2.87     FEV6-%Pred-Pre   %   86     Pre FEV1/FVC ratio   %   66     FEV1FVC-%Pred-Pre   %   81      Pre FEV6/FVC Ratio   %   99     FEV6FVC-%Pred-Pre   %   103     FEF 25-75 Pre   L/sec   1.19     FEF2575-%Pred-Pre   %   43     DLCO unc   ml/min/mmHg   10.30     DLCO unc % pred   %   40     DLCO cor   ml/min/mmHg   11.31     DLCO cor % pred   %   44     DL/VA   ml/min/mmHg/L   2.92     DL/VA % pred   %   67           Impression:     This 51 year old gentleman has ischemic cardiomyopathy with an ejection fraction of 15-20% and was placed on dobutamine for low output heart failure with  New York Heart Association class IV symptoms.  He is symptomatically much better on dobutamine.  He has been referred to [facility] for consideration of heart transplantation but for financial reasons and his blood type O+ it is felt that proceeding with LVAD support is the best option.  His last echocardiogram was done prior to dobutamine and showed mild right ventricular dysfunction.  His last right heart cath was also done prior to dobutamine and showed markedly elevated pulmonary artery pressures and low cardiac index. His RA/PCW at that time was 0.54. PAPI was 2.75.  He is scheduled for extraction of his remaining teeth next Wednesday by Dr. Enrique Sack.  His Eliquis will be stopped for that procedure.  He would like to proceed with LVAD implant the following week if possible.  I discussed the operative procedure of a HeartMate 3 implantation but the patient and his wife.  We discussed alternatives including heart transplantation and palliative medical therapy.  We discussed the benefits and risks of surgery including but not limited to bleeding, blood transfusion, infection, stroke, persistent right ventricular dysfunction possibly requiring RVAD support or prolonged intravenous inotropic support, postoperative organ dysfunction including renal failure and dialysis, pump thrombosis or malfunction, and death.  All of their questions have been answered.   He appears to have a good support system at home with a dedicated wife and other family members who are willing to help him.  He has continued to abstain from smoking which should help him with his pulmonary function testing showing a moderate to severe diffusion defect.        Plan:     Implantation of HeartMate 3 LVAD in the next couple weeks if possible after extraction of his remaining bad teeth.     I spent 60 minutes performing this consultation and > 50% of this time was spent face to face counseling and coordinating the care of this patient's ischemic cardiomyopathy and chronic systolic heart failure.        Gaye Pollack, MD  [facility]  [phone]               Electronically signed by Gaye Pollack, MD at 04/07/2017  2:03 PM               Surgical Consult on 04/06/2017                  Routing History                  Detailed Report

## 2017-04-12 NOTE — Transfer of Care (Signed)
Immediate Anesthesia Transfer of Care Note  Patient: David [name]  Procedure(s) Performed: Extraction of tooth #'s 2, 5-12, 17, 20-29 with alveoloplasty amd maxillary right buccal exostoses reductions. (N/A )  Patient Location: PACU  Anesthesia Type:General  Level of Consciousness: awake, oriented and patient cooperative  Airway & Oxygen Therapy: Patient Spontanous Breathing and Patient connected to face mask oxygen  Post-op Assessment: Report given to RN and Post -op Vital signs reviewed and stable  Post vital signs: Reviewed and stable  Last Vitals:  Vitals:   04/12/17 0731 04/12/17 1050  BP: 109/69   Pulse: 71   Resp: 18   Temp: 36.6 C (P) 36.5 C  SpO2: 93%     Last Pain:  Vitals:   04/12/17 0731  TempSrc: Oral         Complications: No apparent anesthesia complications

## 2017-04-13 ENCOUNTER — Encounter (HOSPITAL_COMMUNITY): Payer: Self-pay | Admitting: Dentistry

## 2017-04-13 DIAGNOSIS — I5022 Chronic systolic (congestive) heart failure: Secondary | ICD-10-CM | POA: Diagnosis not present

## 2017-04-13 DIAGNOSIS — K045 Chronic apical periodontitis: Secondary | ICD-10-CM | POA: Diagnosis not present

## 2017-04-13 LAB — BASIC METABOLIC PANEL
ANION GAP: 11 (ref 5–15)
BUN: 13 mg/dL (ref 6–20)
CALCIUM: 9 mg/dL (ref 8.9–10.3)
CO2: 23 mmol/L (ref 22–32)
CREATININE: 1.48 mg/dL — AB (ref 0.61–1.24)
Chloride: 99 mmol/L — ABNORMAL LOW (ref 101–111)
GFR calc Af Amer: >60 mL/min (ref >60)
GFR, EST NON AFRICAN AMERICAN: 52 mL/min — AB (ref >60)
GLUCOSE: 171 mg/dL — AB (ref 65–99)
Potassium: 3.7 mmol/L (ref 3.5–5.1)
Sodium: 133 mmol/L — ABNORMAL LOW (ref 135–145)

## 2017-04-13 MED ORDER — ACETAMINOPHEN 325 MG PO TABS: 650.0000 mg | ORAL_TABLET | ORAL | Status: DC | PRN

## 2017-04-13 MED ORDER — HEPARIN SOD (PORK) LOCK FLUSH 100 UNIT/ML IV SOLN
250.0000 [IU] | INTRAVENOUS | Status: AC | PRN
  Administered 2017-04-13: 250 [IU]

## 2017-04-13 NOTE — Discharge Summary (Signed)
Advanced Heart Failure Discharge Note  Discharge Summary   Patient ID: David [name] MRN: [id number], DOB/AGE: 1965/07/10 51 y.o. Admit date: 04/12/2017 D/C date:     04/13/2017   Primary Discharge Diagnoses:  1. Dental extraction s/p extraction of tooth #s 2, 5-12, 17, and 20-29 with alveoloplastic and maxillary right buccal exostoses reductions. 2.Chronic systolicHF 3. CAD s/p PCI to mid RCA and PLOM with DES x 2 and DES to mid LAD in 1/18. 4. PAF 5. Acute on CKD stage III 6. Tobacco abuse. 7. LBBB 8. Frequent PVCs 9. Hypokalemia  Hospital Course:   David [name] is a 51 y.o. male with aPMH of ICM ('09 PCI to LAD, mRCA, '10 PCI to Lcx and '12 mRCA), HFrEF (EF ~20% for >5 years), HLD and HTN   Admitted 04/12/17 post dental extraction as above. Labs stable. Continued on dobutamine via home pump. Confirmed with [facility] that pt has enough dobutamine to make it to scheduled "refill" tomorrow, 04/14/17.    Vitals stable and am of 04/13/17 pt tolerated breakfast and morning dosing of medication without difficulty.  Stable for discharge to home. Plan to return next week, 04/19/17, for RHC prior to LVAD placement 04/20/17.  Pt will be discharged to home in stable condition with follow up as below.   Physical Exam General: Well appearing. No resp difficulty. HEENT: Mouth slightly swollen. No visible bleeding.  Neck: Supple. JVP 5-6. Carotids 2+ bilat; no bruits. No thyromegaly or nodule noted. Cor: PMI nondisplaced. RRR, No M/G/R noted. R chest with tunneled PICC Lungs: CTAB, normal effort. Abdomen: Soft, non-tender, non-distended, no HSM. No bruits or masses. +BS  Extremities: No cyanosis, clubbing, or rash. R and LLE no edema.  Neuro: Alert & orientedx3, cranial nerves grossly intact. moves all 4 extremities w/o difficulty. Affect pleasant   Discharge Weight Range: 160 lb Discharge Vitals: Blood pressure 109/66, pulse 79, temperature 98.2 F (36.8 C), temperature source Oral,  resp. rate 18, height 5\' 5"  (1.651 m), weight 160 lb 1.6 oz (72.6 kg), SpO2 92 %.  Labs: Lab Results  Component Value Date   WBC 9.9 04/12/2017   HGB 11.2 (L) 04/12/2017   HCT 35.4 (L) 04/12/2017   MCV 76.1 (L) 04/12/2017   PLT 313 04/12/2017   No results for input(s): NA, K, CL, CO2, BUN, CREATININE, CALCIUM, PROT, BILITOT, ALKPHOS, ALT, AST, GLUCOSE in the last 168 hours.  Invalid input(s): LABALBU Lab Results  Component Value Date   CHOL 117 03/14/2017   HDL 38 (L) 03/14/2017   LDLCALC 60 03/14/2017   TRIG 96 03/14/2017   BNP (last 3 results) Recent Labs    05/03/16 0435 05/19/16 1554 07/27/16 1930  BNP 1,034.8* 470.0* 321.6*    ProBNP (last 3 results) No results for input(s): PROBNP in the last 8760 hours.   Diagnostic Studies/Procedures   No results found.  Discharge Medications   Allergies as of 04/13/2017      Reactions   Plavix [clopidogrel Bisulfate] Hives      Medication List    STOP taking these medications   apixaban 5 MG Tabs tablet Commonly known as:  ELIQUIS     TAKE these medications   acetaminophen 325 MG tablet Commonly known as:  TYLENOL Take 2 tablets (650 mg total) by mouth every 4 (four) hours as needed for headache or mild pain.   ADULT GUMMY Chew Chew 2 tablets by mouth daily.   amiodarone 200 MG tablet Commonly known as:  PACERONE Take 200 mg by mouth 2 (  two) times daily.   aspirin 81 MG chewable tablet Chew 1 tablet (81 mg total) by mouth daily.   atorvastatin 40 MG tablet Commonly known as:  LIPITOR Take 1 tablet (40 mg total) by mouth daily at 6 PM.   DOBUTamine 4-5 MG/ML-% infusion Commonly known as:  DOBUTREX Inject 352.5 mcg/min into the vein continuous.   furosemide 80 MG tablet Commonly known as:  LASIX Take 80 mg by mouth daily.   ivabradine 5 MG Tabs tablet Commonly known as:  CORLANOR Take 0.5 tablets (2.5 mg total) by mouth 2 (two) times daily with a meal.   LINZESS 290 MCG Caps capsule Generic  drug:  linaclotide Take 290 mcg by mouth daily as needed (constipation).   losartan 25 MG tablet Commonly known as:  COZAAR Take 0.5 tablets (12.5 mg total) by mouth daily.   oxyCODONE-acetaminophen 5-325 MG tablet Commonly known as:  PERCOCET Take one or two tablets by mouth every 6 hours as needed for pain.            Durable Medical Equipment  (From admission, onward)        Start     Ordered   04/13/17 0902  Heart failure home health orders  (Heart failure home health orders / Face to face)  Once    Comments:  Heart Failure Follow-up Care:  Verify follow-up appointments per Patient Discharge Instructions. Confirm transportation arranged. Reconcile home medications with discharge medication list. Remove discontinued medications from use. Assist patient/caregiver to manage medications using pill box. Reinforce low sodium food selection Assessments: Vital signs and oxygen saturation at each visit. Assess home environment for safety concerns, caregiver support and availability of low-sodium foods. Consult Education officer, museum, PT/OT, Dietitian, and CNA based on assessments. Perform comprehensive cardiopulmonary assessment. Notify MD for any change in condition or weight gain of 3 pounds in one day or 5 pounds in one week with symptoms. Daily Weights and Symptom Monitoring: Ensure patient has access to scales. Teach patient/caregiver to weigh daily before breakfast and after voiding using same scale and record.    Teach patient/caregiver to track weight and symptoms and when to notify Provider. Activity: Develop individualized activity plan with patient/caregiver.  [facility] to provide  Labs every other week to include BMET, Mg, and CBC with Diff. Additional as needed.   J1250 Dobutamine 5 mcg/kg/min X 52 weeks A4221 Supplies for maintenance of drug infusion catheter A4222 Supplies for the external drug infusion per cassette or bag E0781 Ambulatory Infusion pump  Question Answer  Comment  Heart Failure Follow-up Care [facility] at [phone]   Obtain the following labs Other see comments   Lab frequency Other see comments   Fax lab results to [facility] at [phone]   Diet Low Sodium Heart Healthy   Fluid restrictions: 2000 mL Fluid      04/13/17 0902      Disposition   The patient will be discharged in stable condition to home.  Discharge Instructions    CONSULT TO DIETITIAN   Complete by:  As directed    Assess nutritional  requirements.   Patient is edentulous.   Diet - low sodium heart healthy   Complete by:  As directed    Gauze   Complete by:  As directed    4 x 4 gauze to oral bleeding sites until oozing stops.   Increase activity slowly   Complete by:  As directed    NURSING COMMUNICATION   Complete by:  As directed    Patient is to continue the Amicar 5% rinse postoperatively.  Patient is to rinse with 10 mL's every hour for the next 10 hours and a swish and spit manner.     Follow-up Information    Call Lenn Cal, DDS.   Specialty:  Dentistry Why:  For wound re-check after discharge from anticipated LVAD procedure next week Contact information: [address] [place] [address] [place] Follow up on 04/19/2017.   Why:  for admission prior to VAD placement.  Contact information: [place] [phone]            Duration of Discharge Encounter: Greater than 35 minutes   Signed, Annamaria Helling  04/13/2017, 9:07 AM   Patient seen and examined with the above-signed Advanced Practice Provider and/or Housestaff. I personally reviewed laboratory data, imaging studies and relevant notes. I independently examined the patient and formulated the important aspects of the plan. I have edited the note to reflect any of my changes or salient points. I have personally discussed the plan with  the patient and/or family.  Doing well this am. Pain under control. HF stable. [place] for d/c today. Will be readmitted next week for LVAD implant. [facility] contacted for home dobutamine set-up.   Glori Bickers, MD  10:08 AM

## 2017-04-13 NOTE — Care Management Note (Signed)
Case Management Note  Patient Details  Name: David [name] MRN: [id number] Date of Birth: [birth year]  Subjective/Objective:  CHF                 Action/Plan: Patient lives at home with his spouse; PCP is Raina Mina., MD; has private insurance with [facility] Medicare with prescription drug coverage; pharmacy of choice is [facility]; patient is active with [facility] for home Dobutamine; Carolynn Sayers RN with [facility] made aware; he is scheduled for an LVAD next week; he has scales at home and knows to weigh himself daily and his wife cooks a heart healthy diet low in sodium. No needs identified at this time.  Expected Discharge Date:  04/13/17               Expected Discharge Plan:  [place]  Discharge planning Services  CM Consult  HH Arranged:  RN [facility] Agency:  [place]  Status of Service:  In process, will continue to follow  Sherrilyn Rist [phone] 04/13/2017, 10:47 AM

## 2017-04-13 NOTE — Care Management Obs Status (Signed)
[place] NOTIFICATION   Patient Details  Name: David [name] MRN: [id number] Date of Birth: 03-24-1966   Medicare Observation Status Notification Given:  Yes    Royston Bake, RN 04/13/2017, 11:11 AM

## 2017-04-13 NOTE — Progress Notes (Signed)
Linnell Fulling, nurse case manager [employer], called to report approval for VAD implant next week; letter to follow.   Authorization # [id number] [employer] ID#: [id number]

## 2017-04-19 ENCOUNTER — Encounter (HOSPITAL_COMMUNITY)
Admission: RE | Disposition: A | Discharge status: Home Health | Payer: Self-pay | Source: Ambulatory Visit | Attending: Surgery

## 2017-04-19 ENCOUNTER — Inpatient Hospital Stay (HOSPITAL_COMMUNITY)
Admission: RE | Admit: 2017-04-19 | Discharge: 2017-05-27 | DRG: 001 | Disposition: A | Discharge status: Home Health | Payer: Medicare HMO | Source: Ambulatory Visit | Attending: Surgery | Admitting: Surgery

## 2017-04-19 ENCOUNTER — Other Ambulatory Visit: Payer: Self-pay

## 2017-04-19 ENCOUNTER — Inpatient Hospital Stay (HOSPITAL_COMMUNITY): Payer: Medicare HMO

## 2017-04-19 ENCOUNTER — Encounter (HOSPITAL_COMMUNITY): Payer: Self-pay | Admitting: General Practice

## 2017-04-19 DIAGNOSIS — D62 Acute posthemorrhagic anemia: Secondary | ICD-10-CM | POA: Diagnosis not present

## 2017-04-19 DIAGNOSIS — K5900 Constipation, unspecified: Secondary | ICD-10-CM | POA: Diagnosis not present

## 2017-04-19 DIAGNOSIS — R570 Cardiogenic shock: Secondary | ICD-10-CM | POA: Diagnosis not present

## 2017-04-19 DIAGNOSIS — N186 End stage renal disease: Secondary | ICD-10-CM | POA: Diagnosis present

## 2017-04-19 DIAGNOSIS — Z87891 Personal history of nicotine dependence: Secondary | ICD-10-CM | POA: Diagnosis not present

## 2017-04-19 DIAGNOSIS — L7622 Postprocedural hemorrhage and hematoma of skin and subcutaneous tissue following other procedure: Secondary | ICD-10-CM | POA: Diagnosis not present

## 2017-04-19 DIAGNOSIS — Z01818 Encounter for other preprocedural examination: Secondary | ICD-10-CM

## 2017-04-19 DIAGNOSIS — Z7901 Long term (current) use of anticoagulants: Secondary | ICD-10-CM

## 2017-04-19 DIAGNOSIS — I5023 Acute on chronic systolic (congestive) heart failure: Secondary | ICD-10-CM

## 2017-04-19 DIAGNOSIS — I255 Ischemic cardiomyopathy: Secondary | ICD-10-CM | POA: Diagnosis present

## 2017-04-19 DIAGNOSIS — N179 Acute kidney failure, unspecified: Secondary | ICD-10-CM

## 2017-04-19 DIAGNOSIS — K7200 Acute and subacute hepatic failure without coma: Secondary | ICD-10-CM | POA: Diagnosis not present

## 2017-04-19 DIAGNOSIS — J156 Pneumonia due to other aerobic Gram-negative bacteria: Secondary | ICD-10-CM | POA: Diagnosis not present

## 2017-04-19 DIAGNOSIS — E872 Acidosis: Secondary | ICD-10-CM | POA: Diagnosis present

## 2017-04-19 DIAGNOSIS — Z992 Dependence on renal dialysis: Secondary | ICD-10-CM

## 2017-04-19 DIAGNOSIS — Z95811 Presence of heart assist device: Secondary | ICD-10-CM

## 2017-04-19 DIAGNOSIS — Z955 Presence of coronary angioplasty implant and graft: Secondary | ICD-10-CM | POA: Diagnosis not present

## 2017-04-19 DIAGNOSIS — Z452 Encounter for adjustment and management of vascular access device: Secondary | ICD-10-CM

## 2017-04-19 DIAGNOSIS — N183 Chronic kidney disease, stage 3 (moderate): Secondary | ICD-10-CM | POA: Diagnosis not present

## 2017-04-19 DIAGNOSIS — I5043 Acute on chronic combined systolic (congestive) and diastolic (congestive) heart failure: Secondary | ICD-10-CM | POA: Diagnosis present

## 2017-04-19 DIAGNOSIS — T827XXA Infection and inflammatory reaction due to other cardiac and vascular devices, implants and grafts, initial encounter: Secondary | ICD-10-CM

## 2017-04-19 DIAGNOSIS — Z6823 Body mass index (BMI) 23.0-23.9, adult: Secondary | ICD-10-CM

## 2017-04-19 DIAGNOSIS — N170 Acute kidney failure with tubular necrosis: Secondary | ICD-10-CM | POA: Diagnosis not present

## 2017-04-19 DIAGNOSIS — J9601 Acute respiratory failure with hypoxia: Secondary | ICD-10-CM | POA: Diagnosis not present

## 2017-04-19 DIAGNOSIS — I480 Paroxysmal atrial fibrillation: Secondary | ICD-10-CM | POA: Diagnosis present

## 2017-04-19 DIAGNOSIS — A4159 Other Gram-negative sepsis: Secondary | ICD-10-CM | POA: Diagnosis not present

## 2017-04-19 DIAGNOSIS — F17290 Nicotine dependence, other tobacco product, uncomplicated: Secondary | ICD-10-CM | POA: Diagnosis present

## 2017-04-19 DIAGNOSIS — Y838 Other surgical procedures as the cause of abnormal reaction of the patient, or of later complication, without mention of misadventure at the time of the procedure: Secondary | ICD-10-CM | POA: Diagnosis not present

## 2017-04-19 DIAGNOSIS — I5030 Unspecified diastolic (congestive) heart failure: Secondary | ICD-10-CM

## 2017-04-19 DIAGNOSIS — Y95 Nosocomial condition: Secondary | ICD-10-CM | POA: Diagnosis not present

## 2017-04-19 DIAGNOSIS — I351 Nonrheumatic aortic (valve) insufficiency: Secondary | ICD-10-CM | POA: Diagnosis not present

## 2017-04-19 DIAGNOSIS — E871 Hypo-osmolality and hyponatremia: Secondary | ICD-10-CM | POA: Diagnosis not present

## 2017-04-19 DIAGNOSIS — R1310 Dysphagia, unspecified: Secondary | ICD-10-CM | POA: Diagnosis present

## 2017-04-19 DIAGNOSIS — Z888 Allergy status to other drugs, medicaments and biological substances status: Secondary | ICD-10-CM | POA: Diagnosis not present

## 2017-04-19 DIAGNOSIS — I5084 End stage heart failure: Secondary | ICD-10-CM | POA: Diagnosis present

## 2017-04-19 DIAGNOSIS — D72829 Elevated white blood cell count, unspecified: Secondary | ICD-10-CM | POA: Diagnosis not present

## 2017-04-19 DIAGNOSIS — I132 Hypertensive heart and chronic kidney disease with heart failure and with stage 5 chronic kidney disease, or end stage renal disease: Principal | ICD-10-CM | POA: Diagnosis present

## 2017-04-19 DIAGNOSIS — E876 Hypokalemia: Secondary | ICD-10-CM | POA: Diagnosis present

## 2017-04-19 DIAGNOSIS — R6521 Severe sepsis with septic shock: Secondary | ICD-10-CM | POA: Diagnosis not present

## 2017-04-19 DIAGNOSIS — E43 Unspecified severe protein-calorie malnutrition: Secondary | ICD-10-CM | POA: Diagnosis present

## 2017-04-19 DIAGNOSIS — Z7982 Long term (current) use of aspirin: Secondary | ICD-10-CM

## 2017-04-19 DIAGNOSIS — I252 Old myocardial infarction: Secondary | ICD-10-CM

## 2017-04-19 DIAGNOSIS — D689 Coagulation defect, unspecified: Secondary | ICD-10-CM | POA: Diagnosis not present

## 2017-04-19 DIAGNOSIS — L03311 Cellulitis of abdominal wall: Secondary | ICD-10-CM

## 2017-04-19 DIAGNOSIS — I10 Essential (primary) hypertension: Secondary | ICD-10-CM | POA: Diagnosis not present

## 2017-04-19 DIAGNOSIS — K761 Chronic passive congestion of liver: Secondary | ICD-10-CM | POA: Diagnosis present

## 2017-04-19 DIAGNOSIS — I2510 Atherosclerotic heart disease of native coronary artery without angina pectoris: Secondary | ICD-10-CM | POA: Diagnosis present

## 2017-04-19 DIAGNOSIS — Z01810 Encounter for preprocedural cardiovascular examination: Secondary | ICD-10-CM | POA: Diagnosis not present

## 2017-04-19 DIAGNOSIS — E785 Hyperlipidemia, unspecified: Secondary | ICD-10-CM | POA: Diagnosis present

## 2017-04-19 DIAGNOSIS — Z79899 Other long term (current) drug therapy: Secondary | ICD-10-CM

## 2017-04-19 DIAGNOSIS — Z95810 Presence of automatic (implantable) cardiac defibrillator: Secondary | ICD-10-CM

## 2017-04-19 DIAGNOSIS — J189 Pneumonia, unspecified organism: Secondary | ICD-10-CM

## 2017-04-19 DIAGNOSIS — I509 Heart failure, unspecified: Secondary | ICD-10-CM

## 2017-04-19 DIAGNOSIS — I313 Pericardial effusion (noninflammatory): Secondary | ICD-10-CM | POA: Diagnosis not present

## 2017-04-19 DIAGNOSIS — B999 Unspecified infectious disease: Secondary | ICD-10-CM

## 2017-04-19 HISTORY — DX: Anxiety disorder, unspecified: F41.9

## 2017-04-19 HISTORY — DX: Personal history of other medical treatment: Z92.89

## 2017-04-19 HISTORY — DX: Pneumonia, unspecified organism: J18.9

## 2017-04-19 HISTORY — PX: RIGHT HEART CATH: CATH118263

## 2017-04-19 LAB — COMPREHENSIVE METABOLIC PANEL
ALT: 12 U/L — ABNORMAL LOW (ref 17–63)
ANION GAP: 6 (ref 5–15)
AST: 20 U/L (ref 15–41)
Albumin: 2.3 g/dL — ABNORMAL LOW (ref 3.5–5.0)
Alkaline Phosphatase: 100 U/L (ref 38–126)
BILIRUBIN TOTAL: 1.1 mg/dL (ref 0.3–1.2)
BUN: 14 mg/dL (ref 6–20)
CO2: 27 mmol/L (ref 22–32)
Calcium: 8.3 mg/dL — ABNORMAL LOW (ref 8.9–10.3)
Chloride: 101 mmol/L (ref 101–111)
Creatinine, Ser: 1.48 mg/dL — ABNORMAL HIGH (ref 0.61–1.24)
GFR calc Af Amer: >60 mL/min (ref >60)
GFR, EST NON AFRICAN AMERICAN: 52 mL/min — AB (ref >60)
Glucose, Bld: 110 mg/dL — ABNORMAL HIGH (ref 65–99)
POTASSIUM: 3.2 mmol/L — AB (ref 3.5–5.1)
Sodium: 134 mmol/L — ABNORMAL LOW (ref 135–145)
TOTAL PROTEIN: 6.5 g/dL (ref 6.5–8.1)

## 2017-04-19 LAB — CBC
HEMATOCRIT: 31.4 % — AB (ref 39.0–52.0)
HEMOGLOBIN: 10 g/dL — AB (ref 13.0–17.0)
MCH: 24.2 pg — AB (ref 26.0–34.0)
MCHC: 31.8 g/dL (ref 30.0–36.0)
MCV: 75.8 fL — AB (ref 78.0–100.0)
Platelets: 332 10*3/uL (ref 150–400)
RBC: 4.14 MIL/uL — AB (ref 4.22–5.81)
RDW: 17.1 % — ABNORMAL HIGH (ref 11.5–15.5)
WBC: 9 10*3/uL (ref 4.0–10.5)

## 2017-04-19 LAB — PROTIME-INR
INR: 1.2
Prothrombin Time: 15.1 seconds (ref 11.4–15.2)

## 2017-04-19 LAB — POCT I-STAT 3, VENOUS BLOOD GAS (G3P V)
ACID-BASE EXCESS: 2 mmol/L (ref 0.0–2.0)
BICARBONATE: 25.5 mmol/L (ref 20.0–28.0)
Bicarbonate: 24.3 mmol/L (ref 20.0–28.0)
O2 SAT: 54 %
O2 Saturation: 57 %
PCO2 VEN: 35.8 mmHg — AB (ref 44.0–60.0)
PH VEN: 7.456 — AB (ref 7.250–7.430)
PO2 VEN: 27 mmHg — AB (ref 32.0–45.0)
PO2 VEN: 28 mmHg — AB (ref 32.0–45.0)
TCO2: 25 mmol/L (ref 22–32)
TCO2: 27 mmol/L (ref 22–32)
pCO2, Ven: 36.2 mmHg — ABNORMAL LOW (ref 44.0–60.0)
pH, Ven: 7.439 — ABNORMAL HIGH (ref 7.250–7.430)

## 2017-04-19 LAB — BASIC METABOLIC PANEL
ANION GAP: 9 (ref 5–15)
BUN: 12 mg/dL (ref 6–20)
CHLORIDE: 101 mmol/L (ref 101–111)
CO2: 26 mmol/L (ref 22–32)
Calcium: 8.6 mg/dL — ABNORMAL LOW (ref 8.9–10.3)
Creatinine, Ser: 1.41 mg/dL — ABNORMAL HIGH (ref 0.61–1.24)
GFR calc non Af Amer: 55 mL/min — ABNORMAL LOW (ref >60)
GLUCOSE: 104 mg/dL — AB (ref 65–99)
POTASSIUM: 3.2 mmol/L — AB (ref 3.5–5.1)
Sodium: 136 mmol/L (ref 135–145)

## 2017-04-19 LAB — URINALYSIS, ROUTINE W REFLEX MICROSCOPIC
BILIRUBIN URINE: NEGATIVE
Glucose, UA: NEGATIVE mg/dL
Hgb urine dipstick: NEGATIVE
KETONES UR: NEGATIVE mg/dL
Leukocytes, UA: NEGATIVE
NITRITE: NEGATIVE
Protein, ur: NEGATIVE mg/dL
Specific Gravity, Urine: 1.014 (ref 1.005–1.030)
pH: 6 (ref 5.0–8.0)

## 2017-04-19 LAB — HEMOGLOBIN A1C
HEMOGLOBIN A1C: 6.1 % — AB (ref 4.8–5.6)
Mean Plasma Glucose: 128.37 mg/dL

## 2017-04-19 LAB — PREPARE RBC (CROSSMATCH)

## 2017-04-19 LAB — SURGICAL PCR SCREEN
MRSA, PCR: NEGATIVE
Staphylococcus aureus: NEGATIVE

## 2017-04-19 LAB — ECHOCARDIOGRAM COMPLETE
HEIGHTINCHES: 65 in
WEIGHTICAEL: 2518.4 [oz_av]

## 2017-04-19 SURGERY — RIGHT HEART CATH
Anesthesia: LOCAL

## 2017-04-19 MED ORDER — DIAZEPAM 5 MG PO TABS
5.0000 mg | ORAL_TABLET | Freq: Once | ORAL | Status: AC
  Administered 2017-04-19: 5 mg via ORAL
  Filled 2017-04-19: qty 1

## 2017-04-19 MED ORDER — DEXTROSE 5 % IV SOLN
750.0000 mg | INTRAVENOUS | Status: DC
  Filled 2017-04-19: qty 750

## 2017-04-19 MED ORDER — SODIUM CHLORIDE 0.9 % IV SOLN
0.0000 ug/min | INTRAVENOUS | Status: DC
  Filled 2017-04-19: qty 2

## 2017-04-19 MED ORDER — SODIUM CHLORIDE 0.9 % IV SOLN
0.0400 [IU]/min | INTRAVENOUS | Status: DC
  Filled 2017-04-19: qty 2

## 2017-04-19 MED ORDER — DOBUTAMINE IN D5W 4-5 MG/ML-% IV SOLN
2.0000 ug/kg/min | INTRAVENOUS | Status: DC
  Filled 2017-04-19 (×2): qty 250

## 2017-04-19 MED ORDER — TRANEXAMIC ACID (OHS) PUMP PRIME SOLUTION
2.0000 mg/kg | INTRAVENOUS | Status: DC
  Filled 2017-04-19: qty 1.43

## 2017-04-19 MED ORDER — DIAZEPAM 5 MG PO TABS: 5.0000 mg | ORAL_TABLET | Freq: Once | ORAL | Status: DC

## 2017-04-19 MED ORDER — HEPARIN (PORCINE) IN NACL 2-0.9 UNIT/ML-% IJ SOLN
INTRAMUSCULAR | Status: AC | PRN
  Administered 2017-04-19: 500 mL

## 2017-04-19 MED ORDER — SODIUM CHLORIDE 0.9% FLUSH
3.0000 mL | Freq: Two times a day (BID) | INTRAVENOUS | Status: DC
  Administered 2017-04-19 (×2): 3 mL via INTRAVENOUS

## 2017-04-19 MED ORDER — DOBUTAMINE IN D5W 4-5 MG/ML-% IV SOLN
5.0000 ug/kg/min | INTRAVENOUS | Status: DC
  Administered 2017-04-19: 5 ug/kg/min via INTRAVENOUS
  Filled 2017-04-19: qty 250

## 2017-04-19 MED ORDER — MILRINONE LACTATE IN DEXTROSE 20-5 MG/100ML-% IV SOLN
0.1250 ug/kg/min | INTRAVENOUS | Status: DC
  Filled 2017-04-19: qty 100

## 2017-04-19 MED ORDER — LOSARTAN POTASSIUM 25 MG PO TABS
12.5000 mg | ORAL_TABLET | Freq: Every day | ORAL | Status: DC
  Filled 2017-04-19: qty 1

## 2017-04-19 MED ORDER — SODIUM CHLORIDE 0.9% FLUSH: 3.0000 mL | Freq: Two times a day (BID) | INTRAVENOUS | Status: DC

## 2017-04-19 MED ORDER — MIDAZOLAM HCL 2 MG/2ML IJ SOLN
INTRAMUSCULAR | Status: AC
  Filled 2017-04-19: qty 2

## 2017-04-19 MED ORDER — TRANEXAMIC ACID (OHS) BOLUS VIA INFUSION
15.0000 mg/kg | INTRAVENOUS | Status: AC
  Administered 2017-04-20: 1071 mg via INTRAVENOUS
  Filled 2017-04-19: qty 1071

## 2017-04-19 MED ORDER — DEXTROSE 5 % IV SOLN
1.5000 g | INTRAVENOUS | Status: AC
  Administered 2017-04-20: .75 g via INTRAVENOUS
  Administered 2017-04-20: 1.5 g via INTRAVENOUS
  Filled 2017-04-19: qty 1.5

## 2017-04-19 MED ORDER — NOREPINEPHRINE BITARTRATE 1 MG/ML IV SOLN
0.0000 ug/min | INTRAVENOUS | Status: AC
  Administered 2017-04-20: 2 ug/min via INTRAVENOUS
  Filled 2017-04-19: qty 4

## 2017-04-19 MED ORDER — SODIUM CHLORIDE 0.9% FLUSH: 3.0000 mL | INTRAVENOUS | Status: DC | PRN

## 2017-04-19 MED ORDER — LIDOCAINE HCL (PF) 1 % IJ SOLN
INTRAMUSCULAR | Status: AC
  Filled 2017-04-19: qty 30

## 2017-04-19 MED ORDER — ONDANSETRON HCL 4 MG/2ML IJ SOLN: 4.0000 mg | Freq: Four times a day (QID) | INTRAMUSCULAR | Status: DC | PRN

## 2017-04-19 MED ORDER — FENTANYL CITRATE (PF) 100 MCG/2ML IJ SOLN
INTRAMUSCULAR | Status: DC | PRN
  Administered 2017-04-19: 25 ug via INTRAVENOUS

## 2017-04-19 MED ORDER — SODIUM CHLORIDE 0.9 % IV SOLN
1.5000 mg/kg/h | INTRAVENOUS | Status: AC
  Administered 2017-04-20: 1.5 mg/kg/h via INTRAVENOUS
  Filled 2017-04-19: qty 25

## 2017-04-19 MED ORDER — SODIUM CHLORIDE 0.9 % IV SOLN
600.0000 mg | INTRAVENOUS | Status: DC
  Filled 2017-04-19: qty 600

## 2017-04-19 MED ORDER — ACETAMINOPHEN 325 MG PO TABS: 650.0000 mg | ORAL_TABLET | ORAL | Status: DC | PRN

## 2017-04-19 MED ORDER — FENTANYL CITRATE (PF) 100 MCG/2ML IJ SOLN
INTRAMUSCULAR | Status: AC
  Filled 2017-04-19: qty 2

## 2017-04-19 MED ORDER — CHLORHEXIDINE GLUCONATE 0.12 % MT SOLN
15.0000 mL | Freq: Once | OROMUCOSAL | Status: AC
  Administered 2017-04-20: 15 mL via OROMUCOSAL
  Filled 2017-04-19: qty 15

## 2017-04-19 MED ORDER — FLUCONAZOLE IN SODIUM CHLORIDE 400-0.9 MG/200ML-% IV SOLN
400.0000 mg | INTRAVENOUS | Status: AC
Start: 2017-04-20 — End: 2017-04-20
  Administered 2017-04-20: 400 mg via INTRAVENOUS
  Filled 2017-04-19: qty 200

## 2017-04-19 MED ORDER — DOPAMINE-DEXTROSE 3.2-5 MG/ML-% IV SOLN
0.0000 ug/kg/min | INTRAVENOUS | Status: DC
  Filled 2017-04-19: qty 250

## 2017-04-19 MED ORDER — MAGNESIUM SULFATE 50 % IJ SOLN
40.0000 meq | INTRAMUSCULAR | Status: DC
  Filled 2017-04-19: qty 9.85

## 2017-04-19 MED ORDER — DEXMEDETOMIDINE HCL IN NACL 400 MCG/100ML IV SOLN
0.1000 ug/kg/h | INTRAVENOUS | Status: AC
  Administered 2017-04-20: 0.7 ug/kg/h via INTRAVENOUS
  Filled 2017-04-19: qty 100

## 2017-04-19 MED ORDER — TRANEXAMIC ACID (OHS) BOLUS VIA INFUSION
15.0000 mg/kg | INTRAVENOUS | Status: DC
  Filled 2017-04-19: qty 1071

## 2017-04-19 MED ORDER — POTASSIUM CHLORIDE 2 MEQ/ML IV SOLN
80.0000 meq | INTRAVENOUS | Status: DC
  Filled 2017-04-19: qty 40

## 2017-04-19 MED ORDER — MILRINONE LACTATE IN DEXTROSE 20-5 MG/100ML-% IV SOLN: 0.3000 ug/kg/min | INTRAVENOUS | Status: DC

## 2017-04-19 MED ORDER — MUPIROCIN 2 % EX OINT
1.0000 "application " | TOPICAL_OINTMENT | Freq: Two times a day (BID) | CUTANEOUS | Status: DC
  Administered 2017-04-19: 1 via NASAL
  Filled 2017-04-19: qty 22

## 2017-04-19 MED ORDER — SODIUM CHLORIDE 0.9 % IV SOLN
INTRAVENOUS | Status: DC
  Filled 2017-04-19: qty 30

## 2017-04-19 MED ORDER — NITROGLYCERIN IN D5W 200-5 MCG/ML-% IV SOLN
0.0000 ug/min | INTRAVENOUS | Status: DC
  Filled 2017-04-19: qty 250

## 2017-04-19 MED ORDER — EPINEPHRINE PF 1 MG/ML IJ SOLN
0.0000 ug/min | INTRAVENOUS | Status: AC
  Administered 2017-04-20: 2 ug/min via INTRAVENOUS
  Filled 2017-04-19: qty 4

## 2017-04-19 MED ORDER — VANCOMYCIN HCL 10 G IV SOLR
1250.0000 mg | INTRAVENOUS | Status: AC
  Administered 2017-04-20: 1250 mg via INTRAVENOUS
  Filled 2017-04-19: qty 1250

## 2017-04-19 MED ORDER — SODIUM CHLORIDE 0.9 % IV SOLN: 250.0000 mL | INTRAVENOUS | Status: DC | PRN

## 2017-04-19 MED ORDER — SODIUM CHLORIDE 0.9 % IV SOLN
INTRAVENOUS | Status: AC | PRN
  Administered 2017-04-19: 10 mL/h via INTRAVENOUS

## 2017-04-19 MED ORDER — FUROSEMIDE 80 MG PO TABS
80.0000 mg | ORAL_TABLET | Freq: Every day | ORAL | Status: DC
  Filled 2017-04-19: qty 1

## 2017-04-19 MED ORDER — SODIUM CHLORIDE 0.9 % IV SOLN
INTRAVENOUS | Status: AC
  Administered 2017-04-20: 1.5 [IU]/h via INTRAVENOUS
  Filled 2017-04-19: qty 1

## 2017-04-19 MED ORDER — SODIUM CHLORIDE 0.9% FLUSH: 10.0000 mL | INTRAVENOUS | Status: DC | PRN

## 2017-04-19 MED ORDER — ASPIRIN 81 MG PO CHEW: 81.0000 mg | CHEWABLE_TABLET | ORAL | Status: DC

## 2017-04-19 MED ORDER — SODIUM CHLORIDE 0.9 % IV SOLN
INTRAVENOUS | Status: DC | PRN
  Administered 2017-04-19: 10 mL/h via INTRAVENOUS

## 2017-04-19 MED ORDER — HEPARIN SOD (PORK) LOCK FLUSH 100 UNIT/ML IV SOLN: 250.0000 [IU] | INTRAVENOUS | Status: DC | PRN

## 2017-04-19 MED ORDER — MIDAZOLAM HCL 2 MG/2ML IJ SOLN
INTRAMUSCULAR | Status: DC | PRN
  Administered 2017-04-19: 1 mg via INTRAVENOUS

## 2017-04-19 MED ORDER — ASPIRIN 81 MG PO CHEW: 81.0000 mg | CHEWABLE_TABLET | Freq: Every day | ORAL | Status: DC

## 2017-04-19 MED ORDER — MILRINONE LACTATE IN DEXTROSE 20-5 MG/100ML-% IV SOLN
0.3000 ug/kg/min | INTRAVENOUS | Status: DC
  Filled 2017-04-19: qty 100

## 2017-04-19 MED ORDER — IVABRADINE HCL 5 MG PO TABS
2.5000 mg | ORAL_TABLET | Freq: Two times a day (BID) | ORAL | Status: DC
  Administered 2017-04-19: 2.5 mg via ORAL
  Filled 2017-04-19 (×3): qty 1

## 2017-04-19 MED ORDER — CHLORHEXIDINE GLUCONATE CLOTH 2 % EX PADS
6.0000 | MEDICATED_PAD | Freq: Once | CUTANEOUS | Status: AC
  Administered 2017-04-20: 6 via TOPICAL

## 2017-04-19 MED ORDER — HEPARIN SODIUM (PORCINE) 1000 UNIT/ML IJ SOLN
INTRAMUSCULAR | Status: DC
  Filled 2017-04-19: qty 30

## 2017-04-19 MED ORDER — LINACLOTIDE 290 MCG PO CAPS
290.0000 ug | ORAL_CAPSULE | Freq: Every day | ORAL | Status: DC | PRN
  Filled 2017-04-19: qty 1

## 2017-04-19 MED ORDER — NOREPINEPHRINE BITARTRATE 1 MG/ML IV SOLN
0.0000 ug/min | INTRAVENOUS | Status: DC
  Filled 2017-04-19: qty 4

## 2017-04-19 MED ORDER — ADULT GUMMY PO CHEW: 2.0000 | CHEWABLE_TABLET | Freq: Every day | ORAL | Status: DC

## 2017-04-19 MED ORDER — HEPARIN SOD (PORK) LOCK FLUSH 100 UNIT/ML IV SOLN
250.0000 [IU] | INTRAVENOUS | Status: AC | PRN
  Administered 2017-04-19: 250 [IU]

## 2017-04-19 MED ORDER — VASOPRESSIN 20 UNIT/ML IV SOLN
0.0400 [IU]/min | INTRAVENOUS | Status: DC
  Filled 2017-04-19: qty 2

## 2017-04-19 MED ORDER — DIAZEPAM 5 MG PO TABS: 5.0000 mg | ORAL_TABLET | Freq: Two times a day (BID) | ORAL | Status: DC | PRN

## 2017-04-19 MED ORDER — FLUCONAZOLE IN SODIUM CHLORIDE 400-0.9 MG/200ML-% IV SOLN
400.0000 mg | INTRAVENOUS | Status: DC
  Filled 2017-04-19: qty 200

## 2017-04-19 MED ORDER — DOPAMINE-DEXTROSE 3.2-5 MG/ML-% IV SOLN: 0.0000 ug/kg/min | INTRAVENOUS | Status: DC

## 2017-04-19 MED ORDER — LIDOCAINE HCL (PF) 1 % IJ SOLN
INTRAMUSCULAR | Status: DC | PRN
  Administered 2017-04-19: 1 mL

## 2017-04-19 MED ORDER — ATORVASTATIN CALCIUM 40 MG PO TABS
40.0000 mg | ORAL_TABLET | Freq: Every day | ORAL | Status: DC
  Administered 2017-04-19 – 2017-04-21 (×2): 40 mg via ORAL
  Filled 2017-04-19 (×2): qty 1

## 2017-04-19 MED ORDER — DEXTROSE 5 % IV SOLN
0.0000 ug/min | INTRAVENOUS | Status: DC
  Filled 2017-04-19: qty 4

## 2017-04-19 MED ORDER — DOBUTAMINE IN D5W 4-5 MG/ML-% IV SOLN
2.0000 ug/kg/min | INTRAVENOUS | Status: AC
  Administered 2017-04-20: 5 ug/kg/min via INTRAVENOUS

## 2017-04-19 MED ORDER — SODIUM CHLORIDE 0.9 % IV SOLN
600.0000 mg | INTRAVENOUS | Status: AC
  Administered 2017-04-20: 600 mg via INTRAVENOUS
  Filled 2017-04-19: qty 600

## 2017-04-19 MED ORDER — TRANEXAMIC ACID 1000 MG/10ML IV SOLN
1.5000 mg/kg/h | INTRAVENOUS | Status: DC
  Filled 2017-04-19: qty 25

## 2017-04-19 MED ORDER — CHLORHEXIDINE GLUCONATE CLOTH 2 % EX PADS
6.0000 | MEDICATED_PAD | Freq: Once | CUTANEOUS | Status: AC
  Administered 2017-04-19: 6 via TOPICAL

## 2017-04-19 MED ORDER — VANCOMYCIN HCL 1000 MG IV SOLR
1000.0000 mg | INTRAVENOUS | Status: AC
  Administered 2017-04-20: 1000 mg
  Filled 2017-04-19: qty 1000

## 2017-04-19 MED ORDER — AMIODARONE HCL 200 MG PO TABS
200.0000 mg | ORAL_TABLET | Freq: Two times a day (BID) | ORAL | Status: DC
  Administered 2017-04-19 – 2017-04-22 (×6): 200 mg via ORAL
  Filled 2017-04-19 (×6): qty 1

## 2017-04-19 MED ORDER — ADULT MULTIVITAMIN W/MINERALS CH
1.0000 | ORAL_TABLET | Freq: Every day | ORAL | Status: DC
  Administered 2017-04-19: 1 via ORAL
  Filled 2017-04-19: qty 1

## 2017-04-19 MED ORDER — DIAZEPAM 5 MG PO TABS
ORAL_TABLET | ORAL | Status: AC
  Filled 2017-04-19: qty 1

## 2017-04-19 MED ORDER — DEXMEDETOMIDINE HCL IN NACL 400 MCG/100ML IV SOLN
0.1000 ug/kg/h | INTRAVENOUS | Status: DC
  Filled 2017-04-19: qty 100

## 2017-04-19 MED ORDER — SODIUM CHLORIDE 0.9 % IV SOLN: INTRAVENOUS | Status: DC

## 2017-04-19 MED ORDER — HEPARIN (PORCINE) IN NACL 2-0.9 UNIT/ML-% IJ SOLN
INTRAMUSCULAR | Status: AC
  Filled 2017-04-19: qty 500

## 2017-04-19 MED ORDER — TEMAZEPAM 15 MG PO CAPS: 15.0000 mg | ORAL_CAPSULE | Freq: Once | ORAL | Status: DC | PRN

## 2017-04-19 MED ORDER — MILRINONE LACTATE IN DEXTROSE 20-5 MG/100ML-% IV SOLN
0.2500 ug/kg/min | INTRAVENOUS | Status: DC
  Administered 2017-04-19 – 2017-04-21 (×4): 0.25 ug/kg/min via INTRAVENOUS
  Administered 2017-04-22 – 2017-04-24 (×4): 0.375 ug/kg/min via INTRAVENOUS
  Filled 2017-04-19 (×8): qty 100

## 2017-04-19 MED ORDER — DIAZEPAM 5 MG PO TABS
5.0000 mg | ORAL_TABLET | Freq: Once | ORAL | Status: AC
  Administered 2017-04-20: 5 mg via ORAL
  Filled 2017-04-19: qty 1

## 2017-04-19 MED ORDER — OXYCODONE-ACETAMINOPHEN 5-325 MG PO TABS
1.0000 | ORAL_TABLET | ORAL | Status: DC | PRN
  Administered 2017-04-19 (×2): 2 via ORAL
  Filled 2017-04-19 (×2): qty 2

## 2017-04-19 MED ORDER — DEXTROSE 5 % IV SOLN
1.5000 g | INTRAVENOUS | Status: DC
  Filled 2017-04-19: qty 1.5

## 2017-04-19 MED ORDER — VANCOMYCIN HCL 1000 MG IV SOLR
1000.0000 mg | INTRAVENOUS | Status: DC
Start: 2017-04-20 — End: 2017-04-20
  Filled 2017-04-19: qty 1000

## 2017-04-19 MED ORDER — SODIUM CHLORIDE 0.9 % IV SOLN
1250.0000 mg | INTRAVENOUS | Status: DC
  Filled 2017-04-19: qty 1250

## 2017-04-19 MED ORDER — BISACODYL 5 MG PO TBEC
5.0000 mg | DELAYED_RELEASE_TABLET | Freq: Once | ORAL | Status: AC
  Administered 2017-04-19: 5 mg via ORAL
  Filled 2017-04-19: qty 1

## 2017-04-19 MED ORDER — NITROGLYCERIN IN D5W 200-5 MCG/ML-% IV SOLN: 0.0000 ug/min | INTRAVENOUS | Status: DC

## 2017-04-19 MED ORDER — HEPARIN SODIUM (PORCINE) 5000 UNIT/ML IJ SOLN
5000.0000 [IU] | Freq: Three times a day (TID) | INTRAMUSCULAR | Status: DC
  Administered 2017-04-19 (×2): 5000 [IU] via SUBCUTANEOUS
  Filled 2017-04-19 (×2): qty 1

## 2017-04-19 MED ORDER — SODIUM CHLORIDE 0.9 % IV SOLN
INTRAVENOUS | Status: DC
  Filled 2017-04-19: qty 1

## 2017-04-19 SURGICAL SUPPLY — 6 items
CATH BALLN WEDGE 5F 110CM (CATHETERS) ×2 IMPLANT
KIT HEART LEFT (KITS) ×2 IMPLANT
PACK CARDIAC CATHETERIZATION (CUSTOM PROCEDURE TRAY) ×2 IMPLANT
SHEATH GLIDE SLENDER 4/5FR (SHEATH) ×2 IMPLANT
TRANSDUCER W/STOPCOCK (MISCELLANEOUS) ×2 IMPLANT
TUBING CIL FLEX 10 FLL-RA (TUBING) ×2 IMPLANT

## 2017-04-19 NOTE — H&P (Signed)
Advanced Heart Failure H&P Note   Primary Cardiologist : Dr. Geraldo Pitter ([place]) Primary HF: Dr. Haroldine Laws   HPI:  David [name] is a 51 y.o. male with a PMH of ICM ('09 PCI to LAD, mRCA, '10 PCI to Lcx and '12 mRCA), HFrEF (EF ~20% for > 5 years), HLD and HTN who presented to [facility] on 04/29/16 after ~ 2 weeks of worsening SOB, DOE, and orthopnea.   Admitted to [facility] ED 04/29/16 for Afib/AFL. Had Brady/PEA arrest, was intubated, started on pressors and transferred to [facility]. Pressors weaned as tolerated. Extubated 05/01/16. Repeat Echo showed LVEF 15-20%.   Cath on 05/05/16 (full report below) with 3V CAD and high grade lesion RCA and LAD. PCWP 37 so diuresed for optimization prior to PCI.  Pt underwent successful stenting of the proximal to mid RCA and PLOM with DES x 2 and successful stenting of the mid LAD with DES 05/09/16. Placed on ASA and Brilinta for 30 days, and then to transition off ASA and onto Eliquis/Xarelto.   Pt underwent left thoracotomy and insertion of two LV epicardial leads and revision of his CRT system to BiV on 11/07/2016. He saw Dr. Cyndia Bent in Follow up 02/01/2017.  Admitted from cath 03/10/17 which showed low output HF. Started on milrinone, but failed due to hypotension, so switched to dobutamine. Tunneled PICC placed for home inotrope support.   Remains on dobutamine 70mcg/kg/min. Doing well with stable NYHA III symptoms. Admitted last week for teeth extraction.  Presents today for RHC prior to VAD,   Cath this am on dobutamine 71mcg/kg/min  RA = 7  RV = 68/9 PA = 64/26 (39) PCW = 19 Fick cardiac output/index = 4.4/2.5 PVR = 4.5 WU Ao sat = 91% PA sat = 57%, 54% PAPi = 5,4 RA/PCWP = 0.37   Echo 03/11/2017  LVEF 20-25%, Mild AI, Mild/Mod MR, Severe LAE, Mild RV reduction, PA peak pressure 56 mm Hg   Past Medical History:  Diagnosis Date  . AICD (automatic cardioverter/defibrillator) present   . Anemia   . CAD (coronary artery  disease) 2009   AMI in 12/2007 with PCI to LAD, staged PCI to  mid/distal RCA, NSTEMI in 02/2009 with BMS to LCx  . CHF (congestive heart failure) ([place])   . Chronic kidney disease 11/03/2016   stage 3 kidney disease  . Dysrhythmia    "arrythmia problems at some point", "fatal rhythms"  . HLD (hyperlipidemia)   . HTN (hypertension)   . Ischemic cardiomyopathy    Admitted in 07/2010 with CHF exacerbation  . Myocardial infarction ([place])   . Seizures ([place]) 11/03/2016   one seizure in 04/2016 during cardiac event   No current facility-administered medications for this encounter.    No current outpatient medications on file.   Facility-Administered Medications Ordered in Other Encounters  Medication Dose Route Frequency Provider Last Rate Last Dose  . 0.9 %  sodium chloride infusion  250 mL Intravenous PRN Bensimhon, Shaune Pascal, MD      . 0.9 %  sodium chloride infusion   Intravenous Continuous Bensimhon, Shaune Pascal, MD      . 0.9 %  sodium chloride infusion    Continuous PRN Bensimhon, Shaune Pascal, MD   Stopped at 04/19/17 [phone]  . 0.9 %  sodium chloride infusion    Continuous PRN Bensimhon, Shaune Pascal, MD 10 mL/hr at 04/19/17 0853 10 mL/hr at 04/19/17 0853  . aspirin chewable tablet 81 mg  81 mg Oral Pre-Cath Bensimhon, Shaune Pascal, MD      .  diazepam (VALIUM) 5 MG tablet           . fentaNYL (SUBLIMAZE) injection    PRN Bensimhon, Shaune Pascal, MD   25 mcg at 04/19/17 0853  . heparin infusion 2 units/mL in 0.9 % sodium chloride    Continuous PRN Bensimhon, Shaune Pascal, MD   500 mL at 04/19/17 0829  . lidocaine (PF) (XYLOCAINE) 1 % injection    PRN Bensimhon, Shaune Pascal, MD   1 mL at 04/19/17 0852  . midazolam (VERSED) injection    PRN Bensimhon, Shaune Pascal, MD   1 mg at 04/19/17 0854  . sodium chloride flush (NS) 0.9 % injection 3 mL  3 mL Intravenous Q12H Bensimhon, Daniel R, MD      . sodium chloride flush (NS) 0.9 % injection 3 mL  3 mL Intravenous PRN Bensimhon, Shaune Pascal, MD       Allergies  Allergen  Reactions  . Plavix [Clopidogrel Bisulfate] Hives   Social History   Socioeconomic History  . Marital status: Married    Spouse name: [name]  . Number of children: 3  . Years of education: Not on file  . Highest education level: Not on file  Social Needs  . Financial resource strain: Not on file  . Food insecurity - worry: Not on file  . Food insecurity - inability: Not on file  . Transportation needs - medical: Not on file  . Transportation needs - non-medical: Not on file  Occupational History  . Not on file  Tobacco Use  . Smoking status: Former Smoker    Packs/day: 0.20    Years: 30.00    Pack years: 6.00    Last attempt to quit: 04/29/2016    Years since quitting: 0.9  . Smokeless tobacco: Never Used  . Tobacco comment: Used to smoke cigarettes. Pt is currently smoking 3 black & mild cigars per day. He has cut down but never quit.  Substance and Sexual Activity  . Alcohol use: No  . Drug use: No  . Sexual activity: Yes  Other Topics Concern  . Not on file  Social History Narrative  . Not on file   Family History  Problem Relation Age of Onset  . Coronary artery disease Father   . Hypertension Father   . Hypertension Sister   . Coronary artery disease Brother   . Emphysema Brother   . Coronary artery disease Mother   . Hypertension Mother   . Emphysema Mother   . Hypertension Daughter   . Obesity Daughter   . Diabetes Unknown   . Hypertension Unknown   . Coronary artery disease Unknown    There were no vitals filed for this visit. Wt Readings from Last 3 Encounters:  04/19/17 71.4 kg (157 lb 6.4 oz)  04/13/17 72.6 kg (160 lb 1.6 oz)  04/10/17 70.7 kg (155 lb 14.4 oz)    PHYSICAL EXAM: General:  Well appearing. No resp difficulty HEENT: normal x for edentulous and left eye peri-orbital ecchymosis Neck: supple. JVP 7 RIJ tunneled PICC  Carotids 2+ bilat; no bruits. No lymphadenopathy or thryomegaly appreciated. Cor: PMI laterally displaced. Regular rate  & rhythm. 2/6 MR Lungs: clear Abdomen: soft, nontender, nondistended. No hepatosplenomegaly. No bruits or masses. Good bowel sounds. Extremities: no cyanosis, clubbing, rash, edema Neuro: alert & orientedx3, cranial nerves grossly intact. moves all 4 extremities w/o difficulty. Affect pleasant   No results found for this or any previous visit (from the past 24 hour(s)).  ASSESSMENT & PLAN:  1. Acute on chronic systolic HF - Echo 34/19/37 with LVEF 20-25%, Mild MR, Mild/Mod MR, Severe LAE, Mildly reduced RV, PA peak pressure 56 mm Hg. - NYHA III symptoms on home dobutamine. Volume status ok. - RHC today as above. Well-compensated filling pressures and outputs but significant PAH - Will start milrinone. (Continue dobutamine) - Has been seen at [facility]. Approved for VAD. - Admit for VAD tomorrow   2. CAD s/p PCI to mid RCA and PLOM with DES x 2 and DES to mid LAD in 1/18.  - No s/s of ischemia - Continue ASA and statin  - Has been off Brilinta with epicardial lead and need for mechanical support  3. PAF - Maintaining NSR on po amiodarone.  - Off eliquis for VAD - Hgb stable  4. CKD stage III - Stable   5. Tobacco abuse. -Stopped smoking in January. No change.     6. LBBB -s/p CRT-D upgrade in July 2018 with epicardial pacing.  7. Frequent PVCs -Continue ranexa and amio. No change.    Glori Bickers, MD 04/19/17  9:22 AM

## 2017-04-19 NOTE — Progress Notes (Signed)
[name] has been discussed with the VAD Medical Review board on 03-20-17. The team feels as if the patient is a good candidate for Destination LVAD therapy. This has been discussed with [facility] team, pt may be eligible for transplant evaluation in the future. The patient meets criteria for a LVAD implant as listed below:  1)  Has failed to respond to optimal medical management (including beta-blockers and ACE inhibitors if tolerated) for at least 45 of the last 60 days and has been IV inotrope dependent for 14 days; and,       *On Inotropes-Dobutamine 5 mcg/kg/min started 03/10/17.  2)  Has a left ventricular ejection fraction (LVEF) < 25% and, have demonstrated functional limitation with a peak oxygen consumption of <14 ml/kg/min unless balloon pump or inotrope dependent or physically unable to perform the test.         *EF  By echo 03/11/17  20-25%            *CPX - not done- pt on Dobutamine  3)  Social work and palliative care evaluations demonstrate appropriate support system in place for discharge to home with a VAD and that end of life discussions have taken place. Both services have expressed no concern regarding patient's candidacy.         *Social work consult (date): 03/23/17        *Palliative Care Consult (date): 03/14/17  4)  Primary caretaker identified that can be taught along with the patient how to manage        the VAD equipment.        *Name: David [name]  5)  Deemed appropriate by our financial coordinator:         Prior approval: 04/13/17 Auth # [id number]  6)  VAD Coordinator, Basha Krygier/Molly/Lesley has met with patient and caregiver, shown them the VAD equipment and discussed with the patient and caregiver about lifestyle changes necessary for success on mechanical circulatory device.        *Met with wife-[name] on 03/23/17        *Consent for VAD Evaluation/Caregiver Agreement/HIPPA Release/Photo Release signed on 03/13/17  7)  Patient has  been discussed with [place] (Dr. Mosetta Pigeon) and felt to be an appropriate candidate.    8)  Intermacs profile: 3  INTERMACS 1: Critical cardiogenic shock describes a patient who is "crashing and burning", in which a patient has life-threatening hypotension and rapidly escalating inotropic pressor support, with critical organ hypoperfusion often confirmed by worsening acidosis and lactate levels.  INTERMACS 2: Progressive decline describes a patient who has been demonstrated "dependent" on inotropic support but nonetheless shows signs of continuing deterioration in nutrition, renal function, fluid retention, or other major status indicator. Patient profile 2 can also describe a patient with refractory volume overload, perhaps with evidence of impaired perfusion, in whom inotropic infusions cannot be maintained due to tachyarrhythmias, clinical ischemia, or other intolerance.  INTERMACS 3: Stable but inotrope dependent describes a patient who is clinically stable on mild-moderate doses of intravenous inotropes (or has a temporary circulatory support device) after repeated documentation of failure to wean without symptomatic hypotension, worsening symptoms, or progressive organ dysfunction (usually renal). It is critical to monitor nutrition, renal function, fluid balance, and overall status carefully in order to distinguish between a  patient who is truly stable at Patient Profile 3 and a patient who has unappreciated decline rendering them Patient Profile 2. This patient may be either at home or in the hospital.   INTERMACS  4: Resting symptoms describes a patient who is at home on oral therapy but frequently has symptoms of congestion at rest or with activities of daily living (ADL). He or she may have orthopnea, shortness of breath during ADL such as dressing or bathing, gastrointestinal symptoms (abdominal discomfort, nausea, poor appetite), disabling ascites or severe lower extremity edema. This  patient should be carefully considered for more intensive management and surveillance programs, which may in some cases, reveal poor compliance that would compromise outcomes with any therapy.  .  INTERMACS 5: Exertion Intolerant describes a patient who is comfortable at rest but unable to engage in any activity, living predominantly within the house or housebound. This patient has no congestive symptoms, but may have chronically elevated volume status, frequently with renal dysfunction, and may be characterized as exercise intolerant.   INTERMACS 6: Exertion Limited also describes a patient who is comfortable at rest without evidence of fluid overload, but who is able to do some mild activity. Activities of daily living are comfortable and minor activities outside the home such as visiting friends or going to a restaurant can be performed, but fatigue results within a few minutes of any meaningful physical exertion. This patient has occasional episodes of worsening symptoms and is likely to have had a hospitalization for heart failure within the past year.   INTERMACS 7: Advanced NYHA Class 3 describes a patient who is clinically stable with a reasonable level of comfortable activity, despite history of previous decompensation that is not recent. This patient is usually able to walk more than a block. Any decompensation requiring intravenous diuretics or hospitalization within the previous month should make this person a Patient Profile 6 or lower.    9)  NYHA Class: III  Tanda Rockers RN, BSN VAD Coordinator 24/7 Pager [phone]

## 2017-04-19 NOTE — Progress Notes (Signed)
  Echocardiogram 2D Echocardiogram has been performed.  David [name] 04/19/2017, 3:04 PM

## 2017-04-19 NOTE — Progress Notes (Signed)
Patient from home w dobutamine gtt through [facility], verified w [place] clinival liaison, ok for wife to take home, and bring back when ready to DC.

## 2017-04-19 NOTE — Progress Notes (Signed)
CARDIAC REHAB PHASE I   PRE:  Rate/Rhythm: 68 pacing    BP: sitting 103/57    SaO2: 97 RA  MODE:  Ambulation: 1270 ft 1100 ft in 6 minutes  POST:  Rate/Rhythm: 103 pacing    BP: sitting 108/70     SaO2: 97 RA   Pt tolerated walk well, sts he enjoys walking. Moderate pace, no rest needed. His only is gum pain from dental extractions. Gave pt IS and he was able to inspire 1500 mL consistently. Discussed use post op. Pt had good questions regarding mobility post op.  [place], ACSM 04/19/2017 11:37 AM

## 2017-04-19 NOTE — Progress Notes (Signed)
Day of Surgery Procedure(s) (LRB): RIGHT HEART CATH (N/A) Subjective:  Only complaint is of gum discomfort from his extractions. Breathing has been fine at home on dobut 5.   Objective: Vital signs in last 24 hours: Temp:  [97.9 F (36.6 C)-98.3 F (36.8 C)] 98.3 F (36.8 C) (12/26 1444) Pulse Rate:  [0-76] 67 (12/26 0907) Cardiac Rhythm: A-V Sequential paced (12/26 1100) Resp:  [0-19] 19 (12/26 0907) BP: (94-142)/(55-77) 94/55 (12/26 1444) SpO2:  [0 %-99 %] 91 % (12/26 1444) Weight:  [71.4 kg (157 lb 6.4 oz)] 71.4 kg (157 lb 6.4 oz) (12/26 0643)  Hemodynamic parameters for last 24 hours:    Intake/Output from previous day: No intake/output data recorded. Intake/Output this shift: Total I/O In: 12.2 [I.V.:12.2] Out: -   General appearance: alert and cooperative Neurologic: intact Heart: regular rate and rhythm, S1, S2 normal, no murmur, click, rub or gallop Lungs: clear to auscultation bilaterally gums look ok. some ecchymosis of face.  Lab Results: Recent Labs    04/19/17 1337  WBC 9.0  HGB 10.0*  HCT 31.4*  PLT 332   BMET:  Recent Labs    04/19/17 1337  NA 136  K 3.2*  CL 101  CO2 26  GLUCOSE 104*  BUN 12  CREATININE 1.41*  CALCIUM 8.6*    PT/INR:  Recent Labs    04/19/17 1337  LABPROT 15.1  INR 1.20   ABG    Component Value Date/Time   PHART 7.450 03/10/2017 0833   HCO3 25.5 04/19/2017 0904   HCO3 24.3 04/19/2017 0904   TCO2 27 04/19/2017 0904   TCO2 25 04/19/2017 0904   ACIDBASEDEF 0.6 11/03/2016 1142   O2SAT 57.0 04/19/2017 0904   O2SAT 54.0 04/19/2017 0904   CBG (last 3)  No results for input(s): GLUCAP in the last 72 hours.  Assessment/Plan:  This 51 year old gentleman has ischemic cardiomyopathy with an ejection fraction of 15-20% and was placed on dobutamine for low output heart failure with New York Heart Association class IV symptoms.  He is symptomatically much better on dobutamine.  He has been referred to [facility] for  consideration of heart transplantation but for financial reasons and his blood type O+ it is felt that proceeding with LVAD support is the best option.  His last echocardiogram was done prior to dobutamine and showed mild right ventricular dysfunction. His right heart cath today showed well-compensated filling pressures with significant PAH with PA 64/26. PAPi was 5.4 and RA/PCWP 0.37. Echo today shows mild AI, mild MR, mild TR. Mild RV systolic dysfunction. Will re-evaluate in OR with TEE. I discussed the operative procedure of a HeartMate 3 implantation with the patient again.   We discussed alternatives including heart transplantation and palliative medical therapy.  We discussed the benefits and risks of surgery including but not limited to bleeding, blood transfusion, infection, stroke, persistent right ventricular dysfunction possibly requiring RVAD support or prolonged intravenous inotropic support, postoperative organ dysfunction including renal failure and dialysis, pump thrombosis or malfunction, and death.  All of his questions have been answered. His main concern is his claustrophobia and not having his face covered. It may be best to put him under anesthesia before draping for the PA catheter.       LOS: 0 days    Gaye Pollack 04/19/2017

## 2017-04-19 NOTE — Interval H&P Note (Signed)
History and Physical Interval Note:  04/19/2017 8:42 AM  David [name]  has presented today for surgery, with the diagnosis of hf  The various methods of treatment have been discussed with the patient and family. After consideration of risks, benefits and other options for treatment, the patient has consented to  Procedure(s): RIGHT HEART CATH (N/A) as a surgical intervention .  The patient's history has been reviewed, patient examined, no change in status, stable for surgery.  I have reviewed the patient's chart and labs.  Questions were answered to the patient's satisfaction.     [name]

## 2017-04-19 NOTE — Progress Notes (Signed)
[facility]  Active home inotrope pt with [facility] and [facility] teams prior to this readmission.  [facility] team will follow [name] while here to support home care needs at DC.  If patient discharges after hours, please call [phone].   David [name] 04/19/2017, 7:40 AM

## 2017-04-20 ENCOUNTER — Other Ambulatory Visit: Payer: Self-pay

## 2017-04-20 ENCOUNTER — Inpatient Hospital Stay (HOSPITAL_COMMUNITY)
Admission: RE | Disposition: A | Discharge status: Home Health | Payer: Self-pay | Source: Ambulatory Visit | Attending: Surgery

## 2017-04-20 ENCOUNTER — Inpatient Hospital Stay (HOSPITAL_COMMUNITY): Payer: Medicare HMO

## 2017-04-20 ENCOUNTER — Inpatient Hospital Stay (HOSPITAL_COMMUNITY): Payer: Medicare HMO | Admitting: Critical Care Medicine

## 2017-04-20 ENCOUNTER — Inpatient Hospital Stay (HOSPITAL_COMMUNITY): Admission: RE | Admit: 2017-04-20 | Payer: Medicare HMO | Source: Ambulatory Visit | Admitting: Surgery

## 2017-04-20 ENCOUNTER — Encounter (HOSPITAL_COMMUNITY): Payer: Self-pay | Admitting: Critical Care Medicine

## 2017-04-20 DIAGNOSIS — Z95811 Presence of heart assist device: Secondary | ICD-10-CM

## 2017-04-20 DIAGNOSIS — I351 Nonrheumatic aortic (valve) insufficiency: Secondary | ICD-10-CM

## 2017-04-20 HISTORY — PX: TEE WITHOUT CARDIOVERSION: SHX5443

## 2017-04-20 HISTORY — PX: INSERTION OF IMPLANTABLE LEFT VENTRICULAR ASSIST DEVICE: SHX5866

## 2017-04-20 HISTORY — PX: AORTIC VALVE REPAIR: SHX6306

## 2017-04-20 LAB — POCT I-STAT, CHEM 8
BUN: 11 mg/dL (ref 6–20)
BUN: 10 mg/dL (ref 6–20)
BUN: 9 mg/dL (ref 6–20)
BUN: 10 mg/dL (ref 6–20)
BUN: 10 mg/dL (ref 6–20)
BUN: 11 mg/dL (ref 6–20)
BUN: 11 mg/dL (ref 6–20)
CALCIUM ION: 0.81 mmol/L — AB (ref 1.15–1.40)
CALCIUM ION: 1.28 mmol/L (ref 1.15–1.40)
CALCIUM ION: 1.18 mmol/L (ref 1.15–1.40)
CHLORIDE: 100 mmol/L — AB (ref 101–111)
CHLORIDE: 97 mmol/L — AB (ref 101–111)
CHLORIDE: 94 mmol/L — AB (ref 101–111)
CHLORIDE: 96 mmol/L — AB (ref 101–111)
CHLORIDE: 101 mmol/L (ref 101–111)
CHLORIDE: 103 mmol/L (ref 101–111)
CREATININE: 0.9 mg/dL (ref 0.61–1.24)
CREATININE: 1.2 mg/dL (ref 0.61–1.24)
CREATININE: 1.1 mg/dL (ref 0.61–1.24)
CREATININE: 1.1 mg/dL (ref 0.61–1.24)
CREATININE: 1.1 mg/dL (ref 0.61–1.24)
Calcium, Ion: 1.14 mmol/L — ABNORMAL LOW (ref 1.15–1.40)
Calcium, Ion: 0.99 mmol/L — ABNORMAL LOW (ref 1.15–1.40)
Calcium, Ion: 1.11 mmol/L — ABNORMAL LOW (ref 1.15–1.40)
Calcium, Ion: 1.09 mmol/L — ABNORMAL LOW (ref 1.15–1.40)
Chloride: 100 mmol/L — ABNORMAL LOW (ref 101–111)
Creatinine, Ser: 1.3 mg/dL — ABNORMAL HIGH (ref 0.61–1.24)
Creatinine, Ser: 1.3 mg/dL — ABNORMAL HIGH (ref 0.61–1.24)
GLUCOSE: 171 mg/dL — AB (ref 65–99)
GLUCOSE: 150 mg/dL — AB (ref 65–99)
Glucose, Bld: 101 mg/dL — ABNORMAL HIGH (ref 65–99)
Glucose, Bld: 136 mg/dL — ABNORMAL HIGH (ref 65–99)
Glucose, Bld: 137 mg/dL — ABNORMAL HIGH (ref 65–99)
Glucose, Bld: 171 mg/dL — ABNORMAL HIGH (ref 65–99)
Glucose, Bld: 168 mg/dL — ABNORMAL HIGH (ref 65–99)
HCT: 25 % — ABNORMAL LOW (ref 39.0–52.0)
HCT: 26 % — ABNORMAL LOW (ref 39.0–52.0)
HCT: 28 % — ABNORMAL LOW (ref 39.0–52.0)
HEMATOCRIT: 33 % — AB (ref 39.0–52.0)
HEMATOCRIT: 25 % — AB (ref 39.0–52.0)
HEMATOCRIT: 28 % — AB (ref 39.0–52.0)
HEMATOCRIT: 23 % — AB (ref 39.0–52.0)
HEMOGLOBIN: 9.5 g/dL — AB (ref 13.0–17.0)
Hemoglobin: 11.2 g/dL — ABNORMAL LOW (ref 13.0–17.0)
Hemoglobin: 8.5 g/dL — ABNORMAL LOW (ref 13.0–17.0)
Hemoglobin: 7.8 g/dL — ABNORMAL LOW (ref 13.0–17.0)
Hemoglobin: 8.5 g/dL — ABNORMAL LOW (ref 13.0–17.0)
Hemoglobin: 8.8 g/dL — ABNORMAL LOW (ref 13.0–17.0)
Hemoglobin: 9.5 g/dL — ABNORMAL LOW (ref 13.0–17.0)
POTASSIUM: 3.3 mmol/L — AB (ref 3.5–5.1)
POTASSIUM: 3.9 mmol/L (ref 3.5–5.1)
POTASSIUM: 4.4 mmol/L (ref 3.5–5.1)
Potassium: 3.4 mmol/L — ABNORMAL LOW (ref 3.5–5.1)
Potassium: 3.6 mmol/L (ref 3.5–5.1)
Potassium: 3.6 mmol/L (ref 3.5–5.1)
Potassium: 5.1 mmol/L (ref 3.5–5.1)
SODIUM: 139 mmol/L (ref 135–145)
SODIUM: 138 mmol/L (ref 135–145)
SODIUM: 138 mmol/L (ref 135–145)
SODIUM: 137 mmol/L (ref 135–145)
SODIUM: 135 mmol/L (ref 135–145)
Sodium: 136 mmol/L (ref 135–145)
Sodium: 136 mmol/L (ref 135–145)
TCO2: 28 mmol/L (ref 22–32)
TCO2: 25 mmol/L (ref 22–32)
TCO2: 26 mmol/L (ref 22–32)
TCO2: 23 mmol/L (ref 22–32)
TCO2: 24 mmol/L (ref 22–32)
TCO2: 25 mmol/L (ref 22–32)
TCO2: 24 mmol/L (ref 22–32)

## 2017-04-20 LAB — POCT I-STAT EG7
Acid-base deficit: 3 mmol/L — ABNORMAL HIGH (ref 0.0–2.0)
BICARBONATE: 23.5 mmol/L (ref 20.0–28.0)
CALCIUM ION: 1.05 mmol/L — AB (ref 1.15–1.40)
HCT: 30 % — ABNORMAL LOW (ref 39.0–52.0)
Hemoglobin: 10.2 g/dL — ABNORMAL LOW (ref 13.0–17.0)
O2 Saturation: 67 %
PCO2 VEN: 49.4 mmHg (ref 44.0–60.0)
Potassium: 3.4 mmol/L — ABNORMAL LOW (ref 3.5–5.1)
SODIUM: 139 mmol/L (ref 135–145)
TCO2: 25 mmol/L (ref 22–32)
pH, Ven: 7.287 (ref 7.250–7.430)
pO2, Ven: 40 mmHg (ref 32.0–45.0)

## 2017-04-20 LAB — POCT I-STAT 3, ART BLOOD GAS (G3+)
ACID-BASE DEFICIT: 4 mmol/L — AB (ref 0.0–2.0)
ACID-BASE DEFICIT: 4 mmol/L — AB (ref 0.0–2.0)
ACID-BASE EXCESS: 1 mmol/L (ref 0.0–2.0)
Acid-Base Excess: 1 mmol/L (ref 0.0–2.0)
Acid-Base Excess: 2 mmol/L (ref 0.0–2.0)
BICARBONATE: 25.8 mmol/L (ref 20.0–28.0)
BICARBONATE: 28.4 mmol/L — AB (ref 20.0–28.0)
BICARBONATE: 29.1 mmol/L — AB (ref 20.0–28.0)
Bicarbonate: 25 mmol/L (ref 20.0–28.0)
Bicarbonate: 22.3 mmol/L (ref 20.0–28.0)
O2 SAT: 95 %
O2 SAT: 97 %
O2 SAT: 99 %
O2 Saturation: 100 %
O2 Saturation: 89 %
PCO2 ART: 40.4 mmHg (ref 32.0–48.0)
PCO2 ART: 55.8 mmHg — AB (ref 32.0–48.0)
PCO2 ART: 45.9 mmHg (ref 32.0–48.0)
PH ART: 7.183 — AB (ref 7.350–7.450)
PO2 ART: 402 mmHg — AB (ref 83.0–108.0)
Patient temperature: 37.5
TCO2: 27 mmol/L (ref 22–32)
TCO2: 27 mmol/L (ref 22–32)
TCO2: 30 mmol/L (ref 22–32)
TCO2: 31 mmol/L (ref 22–32)
TCO2: 24 mmol/L (ref 22–32)
pCO2 arterial: 67.3 mmHg (ref 32.0–48.0)
pCO2 arterial: 60.5 mmHg — ABNORMAL HIGH (ref 32.0–48.0)
pH, Arterial: 7.413 (ref 7.350–7.450)
pH, Arterial: 7.284 — ABNORMAL LOW (ref 7.350–7.450)
pH, Arterial: 7.328 — ABNORMAL LOW (ref 7.350–7.450)
pH, Arterial: 7.297 — ABNORMAL LOW (ref 7.350–7.450)
pO2, Arterial: 77 mmHg — ABNORMAL LOW (ref 83.0–108.0)
pO2, Arterial: 91 mmHg (ref 83.0–108.0)
pO2, Arterial: 104 mmHg (ref 83.0–108.0)
pO2, Arterial: 131 mmHg — ABNORMAL HIGH (ref 83.0–108.0)

## 2017-04-20 LAB — BASIC METABOLIC PANEL
Anion gap: 8 (ref 5–15)
BUN: 10 mg/dL (ref 6–20)
CHLORIDE: 103 mmol/L (ref 101–111)
CO2: 24 mmol/L (ref 22–32)
Calcium: 8.2 mg/dL — ABNORMAL LOW (ref 8.9–10.3)
Creatinine, Ser: 1.29 mg/dL — ABNORMAL HIGH (ref 0.61–1.24)
GFR calc Af Amer: >60 mL/min (ref >60)
GFR calc non Af Amer: >60 mL/min (ref >60)
GLUCOSE: 143 mg/dL — AB (ref 65–99)
POTASSIUM: 3.5 mmol/L (ref 3.5–5.1)
Sodium: 135 mmol/L (ref 135–145)

## 2017-04-20 LAB — CBC
HCT: 30.5 % — ABNORMAL LOW (ref 39.0–52.0)
HEMATOCRIT: 36.4 % — AB (ref 39.0–52.0)
HEMOGLOBIN: 9.6 g/dL — AB (ref 13.0–17.0)
Hemoglobin: 11.4 g/dL — ABNORMAL LOW (ref 13.0–17.0)
MCH: 24.5 pg — ABNORMAL LOW (ref 26.0–34.0)
MCH: 24.7 pg — AB (ref 26.0–34.0)
MCHC: 31.3 g/dL (ref 30.0–36.0)
MCHC: 31.5 g/dL (ref 30.0–36.0)
MCV: 78.3 fL (ref 78.0–100.0)
MCV: 78.4 fL (ref 78.0–100.0)
Platelets: 208 10*3/uL (ref 150–400)
Platelets: 194 10*3/uL (ref 150–400)
RBC: 4.65 MIL/uL (ref 4.22–5.81)
RBC: 3.89 MIL/uL — ABNORMAL LOW (ref 4.22–5.81)
RDW: 17.3 % — AB (ref 11.5–15.5)
RDW: 17.5 % — ABNORMAL HIGH (ref 11.5–15.5)
WBC: 23 10*3/uL — AB (ref 4.0–10.5)
WBC: 20.3 10*3/uL — ABNORMAL HIGH (ref 4.0–10.5)

## 2017-04-20 LAB — POCT I-STAT 4, (NA,K, GLUC, HGB,HCT)
Glucose, Bld: 139 mg/dL — ABNORMAL HIGH (ref 65–99)
HCT: 37 % — ABNORMAL LOW (ref 39.0–52.0)
Hemoglobin: 12.6 g/dL — ABNORMAL LOW (ref 13.0–17.0)
Potassium: 3.5 mmol/L (ref 3.5–5.1)
Sodium: 140 mmol/L (ref 135–145)

## 2017-04-20 LAB — MAGNESIUM
MAGNESIUM: 1.9 mg/dL (ref 1.7–2.4)
Magnesium: 3.1 mg/dL — ABNORMAL HIGH (ref 1.7–2.4)

## 2017-04-20 LAB — COOXEMETRY PANEL
CARBOXYHEMOGLOBIN: 1.3 % (ref 0.5–1.5)
CARBOXYHEMOGLOBIN: 1.5 % (ref 0.5–1.5)
METHEMOGLOBIN: 1.3 % (ref 0.0–1.5)
Methemoglobin: 1.5 % (ref 0.0–1.5)
O2 SAT: 69.8 %
O2 Saturation: 72.8 %
TOTAL HEMOGLOBIN: 12.5 g/dL (ref 12.0–16.0)
TOTAL HEMOGLOBIN: 10.1 g/dL — AB (ref 12.0–16.0)

## 2017-04-20 LAB — PROTIME-INR
INR: 1.44
Prothrombin Time: 17.5 seconds — ABNORMAL HIGH (ref 11.4–15.2)

## 2017-04-20 LAB — GLUCOSE, CAPILLARY
GLUCOSE-CAPILLARY: 85 mg/dL (ref 65–99)
GLUCOSE-CAPILLARY: 109 mg/dL — AB (ref 65–99)
Glucose-Capillary: 98 mg/dL (ref 65–99)

## 2017-04-20 LAB — ECHO TEE
AVPHT: 287 ms
LDCA: 3.14 cm2
LVOT diameter: 20 mm
MRPISAEROA: 0.09 cm2
MV VTI: 102 cm

## 2017-04-20 LAB — DIC (DISSEMINATED INTRAVASCULAR COAGULATION)PANEL
D-Dimer, Quant: 5.56 ug/mL-FEU — ABNORMAL HIGH (ref 0.00–0.50)
INR: 1.42
Platelets: 184 10*3/uL (ref 150–400)

## 2017-04-20 LAB — APTT: APTT: 39 s — AB (ref 24–36)

## 2017-04-20 LAB — FIBRINOGEN: Fibrinogen: 406 mg/dL (ref 210–475)

## 2017-04-20 LAB — DIC (DISSEMINATED INTRAVASCULAR COAGULATION) PANEL
APTT: 41 s — AB (ref 24–36)
FIBRINOGEN: 428 mg/dL (ref 210–475)
PROTHROMBIN TIME: 17.3 s — AB (ref 11.4–15.2)
SMEAR REVIEW: NONE SEEN

## 2017-04-20 LAB — CREATININE, SERUM
Creatinine, Ser: 1.6 mg/dL — ABNORMAL HIGH (ref 0.61–1.24)
GFR calc non Af Amer: 48 mL/min — ABNORMAL LOW (ref >60)
GFR, EST AFRICAN AMERICAN: 55 mL/min — AB (ref >60)

## 2017-04-20 LAB — PLATELET COUNT: PLATELETS: 224 10*3/uL (ref 150–400)

## 2017-04-20 LAB — PREPARE RBC (CROSSMATCH)

## 2017-04-20 SURGERY — INSERTION OF IMPLANTABLE LEFT VENTRICULAR ASSIST DEVICE
Anesthesia: General | Site: Chest

## 2017-04-20 MED ORDER — VANCOMYCIN HCL IN DEXTROSE 1-5 GM/200ML-% IV SOLN
1000.0000 mg | Freq: Two times a day (BID) | INTRAVENOUS | Status: DC
  Filled 2017-04-20: qty 200

## 2017-04-20 MED ORDER — SODIUM CHLORIDE 0.9 % IJ SOLN
INTRAMUSCULAR | Status: AC
  Filled 2017-04-20: qty 20

## 2017-04-20 MED ORDER — ORAL CARE MOUTH RINSE
15.0000 mL | Freq: Four times a day (QID) | OROMUCOSAL | Status: DC
  Administered 2017-04-20 – 2017-04-21 (×3): 15 mL via OROMUCOSAL

## 2017-04-20 MED ORDER — SODIUM CHLORIDE 0.9% FLUSH
10.0000 mL | INTRAVENOUS | Status: DC | PRN
  Administered 2017-04-30: 10 mL
  Filled 2017-04-20: qty 40

## 2017-04-20 MED ORDER — DEXAMETHASONE SODIUM PHOSPHATE 10 MG/ML IJ SOLN
INTRAMUSCULAR | Status: AC
  Filled 2017-04-20: qty 1

## 2017-04-20 MED ORDER — POTASSIUM CHLORIDE 10 MEQ/50ML IV SOLN
10.0000 meq | INTRAVENOUS | Status: AC
  Administered 2017-04-20 (×3): 10 meq via INTRAVENOUS
  Filled 2017-04-20: qty 50

## 2017-04-20 MED ORDER — FUROSEMIDE 10 MG/ML IJ SOLN
20.0000 mg | Freq: Once | INTRAMUSCULAR | Status: AC
  Administered 2017-04-21: 20 mg via INTRAVENOUS

## 2017-04-20 MED ORDER — ROCURONIUM BROMIDE 10 MG/ML (PF) SYRINGE
PREFILLED_SYRINGE | INTRAVENOUS | Status: AC
  Filled 2017-04-20: qty 20

## 2017-04-20 MED ORDER — MORPHINE SULFATE (PF) 4 MG/ML IV SOLN
2.0000 mg | INTRAVENOUS | Status: DC | PRN
  Administered 2017-04-20: 2 mg via INTRAVENOUS
  Administered 2017-04-21 (×3): 4 mg via INTRAVENOUS
  Administered 2017-04-21: 2 mg via INTRAVENOUS
  Administered 2017-04-21: 4 mg via INTRAVENOUS
  Administered 2017-04-21: 2 mg via INTRAVENOUS
  Administered 2017-04-21: 4 mg via INTRAVENOUS
  Administered 2017-04-21: 2 mg via INTRAVENOUS
  Administered 2017-04-22 – 2017-04-25 (×12): 4 mg via INTRAVENOUS
  Administered 2017-05-01 – 2017-05-07 (×16): 2 mg via INTRAVENOUS
  Filled 2017-04-20 (×39): qty 1

## 2017-04-20 MED ORDER — FLUCONAZOLE IN SODIUM CHLORIDE 400-0.9 MG/200ML-% IV SOLN
400.0000 mg | Freq: Once | INTRAVENOUS | Status: AC
  Administered 2017-04-21: 400 mg via INTRAVENOUS
  Filled 2017-04-20: qty 200

## 2017-04-20 MED ORDER — PROPOFOL 10 MG/ML IV BOLUS
INTRAVENOUS | Status: AC
  Filled 2017-04-20: qty 20

## 2017-04-20 MED ORDER — SODIUM CHLORIDE 0.9% FLUSH: 3.0000 mL | INTRAVENOUS | Status: DC | PRN

## 2017-04-20 MED ORDER — ACETAMINOPHEN 500 MG PO TABS
1000.0000 mg | ORAL_TABLET | Freq: Four times a day (QID) | ORAL | Status: DC
  Administered 2017-04-21 – 2017-04-22 (×4): 1000 mg via ORAL
  Filled 2017-04-20 (×4): qty 2

## 2017-04-20 MED ORDER — SODIUM CHLORIDE 0.9 % IV SOLN
INTRAVENOUS | Status: DC
  Administered 2017-04-24: 17:00:00 via INTRAVENOUS

## 2017-04-20 MED ORDER — HEMOSTATIC AGENTS (NO CHARGE) OPTIME
TOPICAL | Status: DC | PRN
  Administered 2017-04-20: 1 via TOPICAL

## 2017-04-20 MED ORDER — LACTATED RINGERS IV SOLN: INTRAVENOUS | Status: DC

## 2017-04-20 MED ORDER — DEXTROSE 5 % IV SOLN
1.5000 g | Freq: Two times a day (BID) | INTRAVENOUS | Status: AC
  Administered 2017-04-20 – 2017-04-22 (×4): 1.5 g via INTRAVENOUS
  Filled 2017-04-20 (×4): qty 1.5

## 2017-04-20 MED ORDER — EPHEDRINE SULFATE 50 MG/ML IJ SOLN
INTRAMUSCULAR | Status: AC
  Filled 2017-04-20: qty 1

## 2017-04-20 MED ORDER — INSULIN ASPART 100 UNIT/ML ~~LOC~~ SOLN
0.0000 [IU] | SUBCUTANEOUS | Status: DC
  Administered 2017-04-20: 4 [IU] via SUBCUTANEOUS
  Administered 2017-04-21: 2 [IU] via SUBCUTANEOUS
  Administered 2017-04-21 (×2): 4 [IU] via SUBCUTANEOUS
  Administered 2017-04-21 – 2017-04-26 (×13): 2 [IU] via SUBCUTANEOUS

## 2017-04-20 MED ORDER — ARTIFICIAL TEARS OPHTHALMIC OINT
TOPICAL_OINTMENT | OPHTHALMIC | Status: AC
  Filled 2017-04-20: qty 3.5

## 2017-04-20 MED ORDER — ONDANSETRON HCL 4 MG/2ML IJ SOLN
4.0000 mg | Freq: Four times a day (QID) | INTRAMUSCULAR | Status: DC | PRN
  Administered 2017-05-23: 4 mg via INTRAVENOUS
  Filled 2017-04-20 (×2): qty 2

## 2017-04-20 MED ORDER — ARTIFICIAL TEARS OPHTHALMIC OINT
TOPICAL_OINTMENT | OPHTHALMIC | Status: DC | PRN
  Administered 2017-04-20: 1 via OPHTHALMIC

## 2017-04-20 MED ORDER — TRAMADOL HCL 50 MG PO TABS: 50.0000 mg | ORAL_TABLET | ORAL | Status: DC | PRN

## 2017-04-20 MED ORDER — THROMBIN (RECOMBINANT) 5000 UNITS EX SOLR
CUTANEOUS | Status: DC | PRN
Start: 2017-04-20 — End: 2017-04-20
  Administered 2017-04-20 (×3): 5000 [IU] via TOPICAL

## 2017-04-20 MED ORDER — PROTAMINE SULFATE 10 MG/ML IV SOLN
INTRAVENOUS | Status: DC | PRN
  Administered 2017-04-20: 250 mg via INTRAVENOUS

## 2017-04-20 MED ORDER — ACETAMINOPHEN 650 MG RE SUPP
650.0000 mg | Freq: Once | RECTAL | Status: AC
  Administered 2017-04-20: 650 mg via RECTAL

## 2017-04-20 MED ORDER — LACTATED RINGERS IV SOLN
INTRAVENOUS | Status: DC | PRN
  Administered 2017-04-20: 08:00:00 via INTRAVENOUS

## 2017-04-20 MED ORDER — MIDAZOLAM HCL 2 MG/2ML IJ SOLN
2.0000 mg | INTRAMUSCULAR | Status: DC | PRN
  Administered 2017-04-20: 2 mg via INTRAVENOUS
  Filled 2017-04-20: qty 2

## 2017-04-20 MED ORDER — SODIUM CHLORIDE 0.45 % IV SOLN
INTRAVENOUS | Status: DC | PRN
Start: 2017-04-20 — End: 2017-04-28
  Administered 2017-04-23 – 2017-04-24 (×2): via INTRAVENOUS

## 2017-04-20 MED ORDER — 0.9 % SODIUM CHLORIDE (POUR BTL) OPTIME
TOPICAL | Status: DC | PRN
  Administered 2017-04-20: 6000 mL

## 2017-04-20 MED ORDER — HEPARIN SODIUM (PORCINE) 1000 UNIT/ML IJ SOLN
INTRAMUSCULAR | Status: DC | PRN
  Administered 2017-04-20: 25000 [IU] via INTRAVENOUS

## 2017-04-20 MED ORDER — NOREPINEPHRINE BITARTRATE 1 MG/ML IV SOLN
0.0000 ug/min | INTRAVENOUS | Status: DC
  Administered 2017-04-20: 10 ug/min via INTRAVENOUS
  Filled 2017-04-20 (×2): qty 4

## 2017-04-20 MED ORDER — MIDAZOLAM HCL 10 MG/2ML IJ SOLN
INTRAMUSCULAR | Status: AC
  Filled 2017-04-20: qty 2

## 2017-04-20 MED ORDER — CHLORHEXIDINE GLUCONATE 0.12% ORAL RINSE (MEDLINE KIT)
15.0000 mL | Freq: Two times a day (BID) | OROMUCOSAL | Status: DC
  Administered 2017-04-20 – 2017-04-21 (×2): 15 mL via OROMUCOSAL

## 2017-04-20 MED ORDER — FAMOTIDINE IN NACL 20-0.9 MG/50ML-% IV SOLN
20.0000 mg | Freq: Two times a day (BID) | INTRAVENOUS | Status: AC
  Administered 2017-04-20 (×2): 20 mg via INTRAVENOUS
  Filled 2017-04-20: qty 50

## 2017-04-20 MED ORDER — ALBUMIN HUMAN 5 % IV SOLN
250.0000 mL | INTRAVENOUS | Status: DC | PRN
  Administered 2017-04-20 (×3): 250 mL via INTRAVENOUS
  Filled 2017-04-20: qty 250

## 2017-04-20 MED ORDER — POTASSIUM CHLORIDE 10 MEQ/50ML IV SOLN
10.0000 meq | Freq: Once | INTRAVENOUS | Status: AC
  Administered 2017-04-20: 10 meq via INTRAVENOUS

## 2017-04-20 MED ORDER — LACTATED RINGERS IV SOLN: 500.0000 mL | Freq: Once | INTRAVENOUS | Status: DC | PRN

## 2017-04-20 MED ORDER — FENTANYL CITRATE (PF) 250 MCG/5ML IJ SOLN
INTRAMUSCULAR | Status: DC | PRN
  Administered 2017-04-20: 250 ug via INTRAVENOUS
  Administered 2017-04-20: 50 ug via INTRAVENOUS
  Administered 2017-04-20: 700 ug via INTRAVENOUS

## 2017-04-20 MED ORDER — OXYCODONE HCL 5 MG PO TABS
5.0000 mg | ORAL_TABLET | ORAL | Status: DC | PRN
  Administered 2017-04-21: 5 mg via ORAL
  Administered 2017-04-21 – 2017-04-23 (×7): 10 mg via ORAL
  Administered 2017-05-03 (×3): 5 mg via ORAL
  Administered 2017-05-04 – 2017-05-05 (×6): 10 mg via ORAL
  Administered 2017-05-05: 5 mg via ORAL
  Administered 2017-05-05 – 2017-05-10 (×22): 10 mg via ORAL
  Administered 2017-05-11: 5 mg via ORAL
  Administered 2017-05-11: 10 mg via ORAL
  Administered 2017-05-11: 5 mg via ORAL
  Administered 2017-05-11: 10 mg via ORAL
  Administered 2017-05-11 (×2): 5 mg via ORAL
  Administered 2017-05-11 – 2017-05-13 (×6): 10 mg via ORAL
  Filled 2017-04-20 (×4): qty 2
  Filled 2017-04-20: qty 1
  Filled 2017-04-20 (×8): qty 2
  Filled 2017-04-20 (×2): qty 1
  Filled 2017-04-20 (×12): qty 2
  Filled 2017-04-20: qty 1
  Filled 2017-04-20 (×3): qty 2
  Filled 2017-04-20: qty 1
  Filled 2017-04-20 (×5): qty 2
  Filled 2017-04-20 (×2): qty 1
  Filled 2017-04-20 (×5): qty 2
  Filled 2017-04-20: qty 1
  Filled 2017-04-20 (×3): qty 2
  Filled 2017-04-20: qty 1
  Filled 2017-04-20: qty 2
  Filled 2017-04-20: qty 1
  Filled 2017-04-20 (×2): qty 2

## 2017-04-20 MED ORDER — ASPIRIN 81 MG PO CHEW: 324.0000 mg | CHEWABLE_TABLET | Freq: Every day | ORAL | Status: DC

## 2017-04-20 MED ORDER — CHLORHEXIDINE GLUCONATE 0.12 % MT SOLN
15.0000 mL | OROMUCOSAL | Status: AC
Start: 2017-04-20 — End: 2017-04-20
  Administered 2017-04-20: 15 mL via OROMUCOSAL

## 2017-04-20 MED ORDER — ROCURONIUM BROMIDE 10 MG/ML (PF) SYRINGE
PREFILLED_SYRINGE | INTRAVENOUS | Status: DC | PRN
Start: 2017-04-20 — End: 2017-04-20
  Administered 2017-04-20 (×5): 50 mg via INTRAVENOUS

## 2017-04-20 MED ORDER — CHLORHEXIDINE GLUCONATE CLOTH 2 % EX PADS
6.0000 | MEDICATED_PAD | Freq: Every day | CUTANEOUS | Status: DC
  Administered 2017-04-20 – 2017-05-13 (×23): 6 via TOPICAL

## 2017-04-20 MED ORDER — FENTANYL CITRATE (PF) 250 MCG/5ML IJ SOLN
INTRAMUSCULAR | Status: AC
  Filled 2017-04-20: qty 25

## 2017-04-20 MED ORDER — PHENYLEPHRINE 40 MCG/ML (10ML) SYRINGE FOR IV PUSH (FOR BLOOD PRESSURE SUPPORT)
PREFILLED_SYRINGE | INTRAVENOUS | Status: AC
  Filled 2017-04-20: qty 20

## 2017-04-20 MED ORDER — SODIUM CHLORIDE 0.9% FLUSH
10.0000 mL | Freq: Two times a day (BID) | INTRAVENOUS | Status: DC
  Administered 2017-04-20 – 2017-04-22 (×4): 10 mL
  Administered 2017-04-23: 40 mL
  Administered 2017-04-23 – 2017-04-28 (×11): 10 mL

## 2017-04-20 MED ORDER — SODIUM CHLORIDE 0.9 % IV SOLN: 250.0000 mL | INTRAVENOUS | Status: DC

## 2017-04-20 MED ORDER — ACETAMINOPHEN 160 MG/5ML PO SOLN
1000.0000 mg | Freq: Four times a day (QID) | ORAL | Status: DC
  Administered 2017-04-20 – 2017-04-21 (×2): 1000 mg
  Filled 2017-04-20 (×2): qty 40.6

## 2017-04-20 MED ORDER — DOCUSATE SODIUM 100 MG PO CAPS
200.0000 mg | ORAL_CAPSULE | Freq: Every day | ORAL | Status: DC
  Administered 2017-04-21 – 2017-04-22 (×2): 200 mg via ORAL
  Filled 2017-04-20 (×2): qty 2

## 2017-04-20 MED ORDER — PROPOFOL 10 MG/ML IV BOLUS
INTRAVENOUS | Status: DC | PRN
  Administered 2017-04-20: 50 mg via INTRAVENOUS

## 2017-04-20 MED ORDER — BISACODYL 5 MG PO TBEC
10.0000 mg | DELAYED_RELEASE_TABLET | Freq: Every day | ORAL | Status: DC
  Administered 2017-04-21 – 2017-04-22 (×2): 10 mg via ORAL
  Filled 2017-04-20 (×2): qty 2

## 2017-04-20 MED ORDER — THROMBIN (RECOMBINANT) 5000 UNITS EX SOLR
CUTANEOUS | Status: AC
  Filled 2017-04-20: qty 15000

## 2017-04-20 MED ORDER — PHENYLEPHRINE 40 MCG/ML (10ML) SYRINGE FOR IV PUSH (FOR BLOOD PRESSURE SUPPORT)
PREFILLED_SYRINGE | INTRAVENOUS | Status: DC | PRN
  Administered 2017-04-20: 40 ug via INTRAVENOUS
  Administered 2017-04-20: 80 ug via INTRAVENOUS

## 2017-04-20 MED ORDER — LIDOCAINE 2% (20 MG/ML) 5 ML SYRINGE
INTRAMUSCULAR | Status: DC | PRN
  Administered 2017-04-20: 100 mg via INTRAVENOUS

## 2017-04-20 MED ORDER — PHENYLEPHRINE HCL 10 MG/ML IJ SOLN
INTRAMUSCULAR | Status: DC | PRN
  Administered 2017-04-20: 80 ug via INTRAVENOUS

## 2017-04-20 MED ORDER — DEXMEDETOMIDINE HCL IN NACL 400 MCG/100ML IV SOLN
0.1000 ug/kg/h | INTRAVENOUS | Status: DC
  Administered 2017-04-20 – 2017-04-21 (×2): 0.7 ug/kg/h via INTRAVENOUS
  Filled 2017-04-20 (×2): qty 100

## 2017-04-20 MED ORDER — CALCIUM CHLORIDE 10 % IV SOLN
INTRAVENOUS | Status: AC
  Filled 2017-04-20: qty 10

## 2017-04-20 MED ORDER — SODIUM CHLORIDE 0.9 % IR SOLN
Status: DC | PRN
  Administered 2017-04-20: 1000 mL

## 2017-04-20 MED ORDER — EPINEPHRINE PF 1 MG/ML IJ SOLN
4.0000 ug/min | INTRAMUSCULAR | Status: DC
  Administered 2017-04-21 – 2017-04-22 (×2): 4 ug/min via INTRAVENOUS
  Administered 2017-04-23 (×2): 5 ug/min via INTRAVENOUS
  Administered 2017-04-24: 8 ug/min via INTRAVENOUS
  Administered 2017-04-24: 10 ug/min via INTRAVENOUS
  Administered 2017-04-24: 8 ug/min via INTRAVENOUS
  Administered 2017-04-25: 10 ug/min via INTRAVENOUS
  Administered 2017-04-25: 9 ug/min via INTRAVENOUS
  Administered 2017-04-25: 10 ug/min via INTRAVENOUS
  Administered 2017-04-26: 8 ug/min via INTRAVENOUS
  Administered 2017-04-26 – 2017-05-02 (×8): 4 ug/min via INTRAVENOUS
  Filled 2017-04-20 (×21): qty 4

## 2017-04-20 MED ORDER — ASPIRIN EC 325 MG PO TBEC
325.0000 mg | DELAYED_RELEASE_TABLET | Freq: Every day | ORAL | Status: DC
  Administered 2017-04-21 – 2017-04-22 (×2): 325 mg via ORAL
  Filled 2017-04-20 (×2): qty 1

## 2017-04-20 MED ORDER — INSULIN REGULAR BOLUS VIA INFUSION
0.0000 [IU] | Freq: Three times a day (TID) | INTRAVENOUS | Status: DC
  Filled 2017-04-20: qty 10

## 2017-04-20 MED ORDER — THROMBIN (RECOMBINANT) 5000 UNITS EX SOLR
OROMUCOSAL | Status: DC | PRN
  Administered 2017-04-20 (×4): 5 mL via TOPICAL

## 2017-04-20 MED ORDER — ACETAMINOPHEN 160 MG/5ML PO SOLN: 650.0000 mg | Freq: Once | ORAL | Status: AC

## 2017-04-20 MED ORDER — BISACODYL 10 MG RE SUPP: 10.0000 mg | Freq: Every day | RECTAL | Status: DC

## 2017-04-20 MED ORDER — MIDAZOLAM HCL 5 MG/5ML IJ SOLN
INTRAMUSCULAR | Status: DC | PRN
  Administered 2017-04-20 (×2): 2 mg via INTRAVENOUS
  Administered 2017-04-20 (×3): 1 mg via INTRAVENOUS

## 2017-04-20 MED ORDER — MORPHINE SULFATE (PF) 4 MG/ML IV SOLN: 1.0000 mg | INTRAVENOUS | Status: DC | PRN

## 2017-04-20 MED ORDER — MAGNESIUM SULFATE 4 GM/100ML IV SOLN
4.0000 g | Freq: Once | INTRAVENOUS | Status: AC
  Administered 2017-04-20: 4 g via INTRAVENOUS
  Filled 2017-04-20: qty 100

## 2017-04-20 MED ORDER — SODIUM CHLORIDE 0.9 % IV SOLN
INTRAVENOUS | Status: DC
  Filled 2017-04-20: qty 1

## 2017-04-20 MED ORDER — LEVALBUTEROL HCL 1.25 MG/0.5ML IN NEBU
1.2500 mg | INHALATION_SOLUTION | Freq: Four times a day (QID) | RESPIRATORY_TRACT | Status: DC
  Administered 2017-04-20 – 2017-04-28 (×29): 1.25 mg via RESPIRATORY_TRACT
  Filled 2017-04-20 (×30): qty 0.5

## 2017-04-20 MED ORDER — SODIUM CHLORIDE 0.9% FLUSH
3.0000 mL | Freq: Two times a day (BID) | INTRAVENOUS | Status: DC
  Administered 2017-04-21: 3 mL via INTRAVENOUS

## 2017-04-20 MED ORDER — MUPIROCIN 2 % EX OINT
1.0000 "application " | TOPICAL_OINTMENT | Freq: Two times a day (BID) | CUTANEOUS | Status: DC
  Administered 2017-04-20: 1 via NASAL
  Filled 2017-04-20: qty 22

## 2017-04-20 MED ORDER — RIFAMPIN 300 MG PO CAPS
600.0000 mg | ORAL_CAPSULE | Freq: Once | ORAL | Status: AC
  Administered 2017-04-21: 600 mg via ORAL
  Filled 2017-04-20: qty 2

## 2017-04-20 MED ORDER — VANCOMYCIN HCL IN DEXTROSE 750-5 MG/150ML-% IV SOLN
750.0000 mg | Freq: Two times a day (BID) | INTRAVENOUS | Status: DC
  Administered 2017-04-20 – 2017-04-21 (×3): 750 mg via INTRAVENOUS
  Filled 2017-04-20 (×4): qty 150

## 2017-04-20 MED ORDER — HEPARIN SODIUM (PORCINE) 1000 UNIT/ML IJ SOLN
INTRAMUSCULAR | Status: AC
  Filled 2017-04-20: qty 1

## 2017-04-20 MED ORDER — ASPIRIN 300 MG RE SUPP: 300.0000 mg | Freq: Every day | RECTAL | Status: DC

## 2017-04-20 MED ORDER — PANTOPRAZOLE SODIUM 40 MG PO TBEC
40.0000 mg | DELAYED_RELEASE_TABLET | Freq: Every day | ORAL | Status: DC
  Administered 2017-04-22: 40 mg via ORAL
  Filled 2017-04-20: qty 1

## 2017-04-20 MED ORDER — PROTAMINE SULFATE 10 MG/ML IV SOLN
INTRAVENOUS | Status: AC
  Filled 2017-04-20: qty 25

## 2017-04-20 MED ORDER — SODIUM BICARBONATE 8.4 % IV SOLN
50.0000 meq | Freq: Once | INTRAVENOUS | Status: AC
  Administered 2017-04-20: 50 meq via INTRAVENOUS

## 2017-04-20 SURGICAL SUPPLY — 123 items
ADAPTER CARDIO PERF ANTE/RETRO (ADAPTER) IMPLANT
ADAPTER DLP PERFUSION .25INX2I (MISCELLANEOUS) ×4 IMPLANT
APPLICATOR CHLORAPREP 3ML ORNG (MISCELLANEOUS) IMPLANT
APPLICATOR TIP COSEAL (VASCULAR PRODUCTS) ×8 IMPLANT
ATTRACTOMAT 16X20 MAGNETIC DRP (DRAPES) IMPLANT
BAG DECANTER FOR FLEXI CONT (MISCELLANEOUS) ×12 IMPLANT
BLADE STERNUM SYSTEM 6 (BLADE) ×4 IMPLANT
BLADE SURG 10 STRL SS (BLADE) IMPLANT
BLADE SURG 12 STRL SS (BLADE) IMPLANT
BLADE SURG 15 STRL LF DISP TIS (BLADE) IMPLANT
BLADE SURG 15 STRL SS (BLADE)
CANISTER SUCT 3000ML PPV (MISCELLANEOUS) ×4 IMPLANT
CANNULA ARTERIAL NVNT 3/8 20FR (MISCELLANEOUS) ×4 IMPLANT
CANNULA ARTERIAL VENT 3/8 20FR (CANNULA) ×4 IMPLANT
CANNULA MC2 2 STG 36/46 NON-V (CANNULA) ×2 IMPLANT
CANNULA SUMP PERICARDIAL (CANNULA) ×4 IMPLANT
CANNULA VENOUS 2 STG 34/46 (CANNULA) ×2
CANNULA VENOUS LOW PROF 34X46 (CANNULA) IMPLANT
CATH CPB KIT VANTRIGT (MISCELLANEOUS) IMPLANT
CATH FOLEY 2WAY SLVR  5CC 14FR (CATHETERS) ×2
CATH FOLEY 2WAY SLVR 5CC 14FR (CATHETERS) ×2 IMPLANT
CATH HEART VENT LEFT (CATHETERS) ×2 IMPLANT
CATH HYDRAGLIDE XL THORACIC (CATHETERS) ×4 IMPLANT
CATH ROBINSON RED A/P 18FR (CATHETERS) ×12 IMPLANT
CATH THORACIC 28FR (CATHETERS) IMPLANT
CATH THORACIC 36FR RT ANG (CATHETERS) IMPLANT
CHLORAPREP W/TINT 26ML (MISCELLANEOUS) IMPLANT
CONN ST 1/4X3/8  BEN (MISCELLANEOUS)
CONN ST 1/4X3/8 BEN (MISCELLANEOUS) IMPLANT
CONT SPEC 4OZ CLIKSEAL STRL BL (MISCELLANEOUS) ×4 IMPLANT
COVER SURGICAL LIGHT HANDLE (MISCELLANEOUS) IMPLANT
CRADLE DONUT ADULT HEAD (MISCELLANEOUS) ×4 IMPLANT
DRAIN CHANNEL 28F RND 3/8 FF (WOUND CARE) IMPLANT
DRAIN CHANNEL 32F RND 10.7 FF (WOUND CARE) IMPLANT
DRAPE BILATERAL SPLIT (DRAPES) ×4 IMPLANT
DRAPE CAMERA CLOSED 9X96 (DRAPES) IMPLANT
DRAPE INCISE IOBAN 66X45 STRL (DRAPES) ×4 IMPLANT
DRAPE SLUSH/WARMER DISC (DRAPES) ×4 IMPLANT
DRAPE TABLE BACK 80X90 (DRAPES) ×4 IMPLANT
DRSG COVADERM 4X14 (GAUZE/BANDAGES/DRESSINGS) ×4 IMPLANT
ELECT BLADE 4.0 EZ CLEAN MEGAD (MISCELLANEOUS)
ELECT BLADE 6.5 EXT (BLADE) ×4 IMPLANT
ELECT CAUTERY BLADE 6.4 (BLADE) ×4 IMPLANT
ELECT PAD GROUND ADT 9 (MISCELLANEOUS) IMPLANT
ELECT REM PT RETURN 9FT ADLT (ELECTROSURGICAL) ×8
ELECTRODE BLDE 4.0 EZ CLN MEGD (MISCELLANEOUS) IMPLANT
ELECTRODE REM PT RTRN 9FT ADLT (ELECTROSURGICAL) ×4 IMPLANT
FELT TEFLON 6X6 (MISCELLANEOUS) IMPLANT
GAUZE SPONGE 4X4 12PLY STRL (GAUZE/BANDAGES/DRESSINGS) ×4 IMPLANT
GAUZE SPONGE 4X4 12PLY STRL LF (GAUZE/BANDAGES/DRESSINGS) ×4 IMPLANT
GLOVE BIO SURGEON STRL SZ7.5 (GLOVE) ×4 IMPLANT
GLOVE BIOGEL M 6.5 STRL (GLOVE) ×12 IMPLANT
GLOVE BIOGEL PI IND STRL 6 (GLOVE) ×6 IMPLANT
GLOVE BIOGEL PI IND STRL 6.5 (GLOVE) ×8 IMPLANT
GLOVE BIOGEL PI INDICATOR 6 (GLOVE) ×6
GLOVE BIOGEL PI INDICATOR 6.5 (GLOVE) ×8
GOWN STRL REUS W/ TWL LRG LVL3 (GOWN DISPOSABLE) ×16 IMPLANT
GOWN STRL REUS W/ TWL XL LVL3 (GOWN DISPOSABLE) ×4 IMPLANT
GOWN STRL REUS W/TWL LRG LVL3 (GOWN DISPOSABLE) ×16
GOWN STRL REUS W/TWL XL LVL3 (GOWN DISPOSABLE) ×4
HEMOSTAT POWDER SURGIFOAM 1G (HEMOSTASIS) ×16 IMPLANT
HEMOSTAT SURGICEL 2X14 (HEMOSTASIS) ×4 IMPLANT
INSERT FOGARTY XLG (MISCELLANEOUS) ×4 IMPLANT
KIT BASIN OR (CUSTOM PROCEDURE TRAY) ×4 IMPLANT
KIT LVAD HEARTMATE 3 W-CNTRL (Prosthesis & Implant Heart) ×3 IMPLANT
KIT LVAD HEARTMATE III W-CNTRL (Prosthesis & Implant Heart) ×1 IMPLANT
KIT ROOM TURNOVER OR (KITS) ×4 IMPLANT
KIT SUCTION CATH 14FR (SUCTIONS) ×8 IMPLANT
LEAD PACING MYOCARDI (MISCELLANEOUS) ×12 IMPLANT
LINE VENT (MISCELLANEOUS) ×4 IMPLANT
NS IRRIG 1000ML POUR BTL (IV SOLUTION) ×20 IMPLANT
PACK OPEN HEART (CUSTOM PROCEDURE TRAY) ×4 IMPLANT
PAD ARMBOARD 7.5X6 YLW CONV (MISCELLANEOUS) ×8 IMPLANT
PAD DEFIB R2 (MISCELLANEOUS) ×4 IMPLANT
PUNCH AORTIC ROTATE 4.5MM 8IN (MISCELLANEOUS) ×4 IMPLANT
SEALANT SURG COSEAL 8ML (VASCULAR PRODUCTS) ×4 IMPLANT
SET CARDIOPLEGIA MPS 5001102 (MISCELLANEOUS) ×4 IMPLANT
SHEATH AVANTI 11CM 5FR (MISCELLANEOUS) IMPLANT
SPONGE LAP 18X18 X RAY DECT (DISPOSABLE) ×4 IMPLANT
SUCKER INTRACARDIAC WEIGHTED (SUCKER) ×4 IMPLANT
SUT BONE WAX W31G (SUTURE) ×4 IMPLANT
SUT ETHIBOND 2 0 SH (SUTURE) ×10
SUT ETHIBOND 2 0 SH 36X2 (SUTURE) ×10 IMPLANT
SUT ETHIBOND 5 LR DA (SUTURE) IMPLANT
SUT ETHIBOND NAB MH 2-0 36IN (SUTURE) ×60 IMPLANT
SUT ETHILON 3 0 FSL (SUTURE) ×4 IMPLANT
SUT PROLENE 3 0 SH 48 (SUTURE) ×8 IMPLANT
SUT PROLENE 3 0 SH DA (SUTURE) ×4 IMPLANT
SUT PROLENE 4 0 RB 1 (SUTURE) ×30
SUT PROLENE 4-0 RB1 .5 CRCL 36 (SUTURE) ×30 IMPLANT
SUT PROLENE 5 0 C1 (SUTURE) ×8 IMPLANT
SUT SILK  1 MH (SUTURE) ×8
SUT SILK 1 MH (SUTURE) ×8 IMPLANT
SUT SILK 1 TIES 10X30 (SUTURE) ×4 IMPLANT
SUT SILK 2 0 SH CR/8 (SUTURE) ×12 IMPLANT
SUT STEEL 6MS V (SUTURE) ×12 IMPLANT
SUT STEEL SZ 6 DBL 3X14 BALL (SUTURE) IMPLANT
SUT TEM PAC WIRE 2 0 SH (SUTURE) ×8 IMPLANT
SUT VIC AB 1 CTX 36 (SUTURE) ×6
SUT VIC AB 1 CTX36XBRD ANBCTR (SUTURE) ×6 IMPLANT
SUT VIC AB 2-0 CT1 27 (SUTURE) ×2
SUT VIC AB 2-0 CT1 TAPERPNT 27 (SUTURE) ×2 IMPLANT
SUT VIC AB 2-0 CTX 27 (SUTURE) ×8 IMPLANT
SUT VIC AB 3-0 SH 8-18 (SUTURE) ×4 IMPLANT
SUT VIC AB 3-0 X1 27 (SUTURE) ×12 IMPLANT
SUT VICRYL 2 TP 1 (SUTURE) IMPLANT
SYR 50ML LL SCALE MARK (SYRINGE) IMPLANT
SYSTEM SAHARA CHEST DRAIN ATS (WOUND CARE) ×8 IMPLANT
TAPE CLOTH SURG 4X10 WHT LF (GAUZE/BANDAGES/DRESSINGS) ×4 IMPLANT
TAPE CLOTH SURG 6X10 WHT LF (GAUZE/BANDAGES/DRESSINGS) ×4 IMPLANT
TAPE STRIPS DRAPE STRL (GAUZE/BANDAGES/DRESSINGS) IMPLANT
TOWEL GREEN STERILE (TOWEL DISPOSABLE) ×4 IMPLANT
TOWEL GREEN STERILE FF (TOWEL DISPOSABLE) IMPLANT
TRAY CATH LUMEN 1 20CM STRL (SET/KITS/TRAYS/PACK) ×4 IMPLANT
TRAY FOLEY IC TEMP SENS 16FR (CATHETERS) ×4 IMPLANT
TRAY FOLEY SILVER 14FR TEMP (SET/KITS/TRAYS/PACK) IMPLANT
TUBE CONNECTING 12'X1/4 (SUCTIONS) ×1
TUBE CONNECTING 12X1/4 (SUCTIONS) ×3 IMPLANT
TUBE SUCT INTRACARD DLP 20F (MISCELLANEOUS) ×4 IMPLANT
UNDERPAD 30X30 (UNDERPADS AND DIAPERS) ×4 IMPLANT
VENT LEFT HEART 12002 (CATHETERS) ×4
WATER STERILE IRR 1000ML POUR (IV SOLUTION) ×8 IMPLANT
YANKAUER SUCT BULB TIP NO VENT (SUCTIONS) ×4 IMPLANT

## 2017-04-20 NOTE — Brief Op Note (Signed)
04/20/2017  2:12 PM  PATIENT:  David [name]  51 y.o. male  PRE-OPERATIVE DIAGNOSIS:  cardiomyopathy  POST-OPERATIVE DIAGNOSIS:  cardiomyopathy  PROCEDURE:  Procedure(s) with comments: INSERTION OF IMPLANTABLE LEFT VENTRICULAR ASSIST DEVICE, Aortic Valve repair (N/A) - Heartmate 3 TRANSESOPHAGEAL ECHOCARDIOGRAM (TEE) (N/A) AORTIC VALVE REPAIR (N/A)  SURGEON:  Surgeon(s) and Role:    * Gaye Pollack, MD - Primary   ASSISTANTS: Ivin Poot, MD   ANESTHESIA:   general  EBL:  1300 mL   BLOOD ADMINISTERED:2 units PRBC's on pump  DRAINS: 2 mediastinal chest tubes, 1 pocket drain, 1 left pleural tube   LOCAL MEDICATIONS USED:  NONE  SPECIMEN:  Source of Specimen:  apical myocardial plug, Thymic lymph node.  DISPOSITION OF SPECIMEN:  PATHOLOGY  COUNTS:  YES  TOURNIQUET:  * No tourniquets in log *  DICTATION: .Note written in EPIC  PLAN OF CARE: Admit to inpatient   PATIENT DISPOSITION:  ICU - intubated and critically ill.   Delay start of Pharmacological VTE agent (>24hrs) due to surgical blood loss or risk of bleeding: yes

## 2017-04-20 NOTE — Anesthesia Procedure Notes (Signed)
Procedure Name: Intubation Date/Time: 04/20/2017 7:56 AM Performed by: Wilburn Cornelia, CRNA Pre-anesthesia Checklist: Patient identified, Emergency Drugs available, Suction available, Patient being monitored and Timeout performed Patient Re-evaluated:Patient Re-evaluated prior to induction Oxygen Delivery Method: Circle system utilized Preoxygenation: Pre-oxygenation with 100% oxygen Induction Type: IV induction Ventilation: Mask ventilation without difficulty and Oral airway inserted - appropriate to patient size Laryngoscope Size: Mac and 4 Grade View: Grade I Tube type: Subglottic suction tube Tube size: 8.0 mm Number of attempts: 1 Airway Equipment and Method: Stylet Placement Confirmation: ETT inserted through vocal cords under direct vision,  positive ETCO2,  CO2 detector and breath sounds checked- equal and bilateral Secured at: 23 cm Tube secured with: Tape Dental Injury: Teeth and Oropharynx as per pre-operative assessment

## 2017-04-20 NOTE — Progress Notes (Signed)
CT surgery p.m. Rounds  Hemodynamically stable after implantation of HeartMate 3 VAD parameters stable at speed of 5300 RPM Urine output 30 cc/h-Lasix IV ordered No significant chest tube output with hemoglobin 11 g

## 2017-04-20 NOTE — Progress Notes (Signed)
LVAD Coordinator Encounter:  Implanted Glean Salvo with HeartMate III LVAD as Destination Therapy by Gilford Raid today. Note: Aortic valve oversewn during this case.   LVAD interrogation reveals:  Speed: 5300 Flow: 3.6 Power:  3.8 PI: 7.8 Alarms:  none Events: none Fixed speed: 5300 Low speed limit: 5000  Emergency back up equipment including pocket controller present.   Drive Line: C/D/I. Anchor present and securely applied in the OR. Pt will need daily dressing changes with silver strip.   LDH trend: 205  Gtts: Levophed 3 mcg/min Epi 3 mcg/min Insulin 3.6 units/hr Precedex 0.7 mcg/kg/hr Milrinone 0.25 mcg/kg/min  Blood products in OR: 2 units/FFP 04/20/17  Plan: 1. Please page VAD coordinator with any equipment questions or concerns. 2. Family/caregiver placed in the CCU waiting area. Wife is planning to stay overnight-advised her to go home but she is persistent about staying.  Tanda Rockers RN, BSN VAD Coordinator 24/7 Pager [phone]

## 2017-04-20 NOTE — Progress Notes (Signed)
Pharmacy Antibiotic Note  Kanton Kamel is a 51 y.o. male s/p LVAD today. Pharmacy asked to dose vancomycin for post-op prophylaxis x 48 hours. Received 1250mg  this morning at 0815. Renal function wnl.  Plan: 1) Vancomycin 750mg  IV q12 x 48 hours  Height: 5\' 5"  (165.1 cm) Weight: 157 lb 6.4 oz (71.4 kg) IBW/kg (Calculated) : 61.5  Temp (24hrs), Avg:99.4 F (37.4 C), Min:97.8 F (36.6 C), Max:100.6 F (38.1 C)  Recent Labs  Lab 04/19/17 1337  04/20/17 0935 04/20/17 1004 04/20/17 1048 04/20/17 1125 04/20/17 1228 04/20/17 1416  WBC 9.0  --   --   --   --   --   --  23.0*  CREATININE 1.41*   < > 1.20 0.90 1.10 1.10 1.10  --    < > = values in this interval not displayed.    Estimated Creatinine Clearance: 67.6 mL/min (by C-G formula based on SCr of 1.1 mg/dL).    Allergies  Allergen Reactions  . Plavix [Clopidogrel Bisulfate] Hives    Thank you for allowing pharmacy to be a part of this patient's care.  Deboraha Sprang 04/20/2017 2:58 PM

## 2017-04-20 NOTE — Anesthesia Postprocedure Evaluation (Signed)
Anesthesia Post Note  Patient: David [name]  Procedure(s) Performed: INSERTION OF IMPLANTABLE LEFT VENTRICULAR ASSIST DEVICE, Aortic Valve repair (N/A Chest) TRANSESOPHAGEAL ECHOCARDIOGRAM (TEE) (N/A ) AORTIC VALVE REPAIR (N/A Chest)     Patient location during evaluation: SICU Anesthesia Type: General Level of consciousness: sedated Pain management: pain level controlled Vital Signs Assessment: post-procedure vital signs reviewed and stable Respiratory status: patient remains intubated per anesthesia plan Cardiovascular status: stable Postop Assessment: no apparent nausea or vomiting Anesthetic complications: no    Last Vitals:  Vitals:   04/20/17 1715 04/20/17 1800  BP:  (!) 84/62  Pulse:    Resp: 20 20  Temp: 37.6 C 37.4 C  SpO2:      Last Pain:  Vitals:   04/20/17 1530  TempSrc: Core (Comment)  PainSc:                  David [name] DAVID

## 2017-04-20 NOTE — Anesthesia Procedure Notes (Signed)
Central Venous Catheter Insertion Performed by: Lillia Abed, MD, anesthesiologist Start/End12/27/2018 7:45 AM, 04/20/2017 8:00 AM Patient location: Pre-op. Preanesthetic checklist: patient identified, IV checked, risks and benefits discussed, surgical consent, monitors and equipment checked, pre-op evaluation, timeout performed and anesthesia consent Position: Trendelenburg Hand hygiene performed  and maximum sterile barriers used  Catheter size: 8.5 Fr PA cath was placed.MAC introducer Swan type:thermodilution Procedure performed using ultrasound guided technique. Ultrasound Notes:anatomy identified, needle tip was noted to be adjacent to the nerve/plexus identified, no ultrasound evidence of intravascular and/or intraneural injection and image(s) printed for medical record Attempts: 1 Following insertion, line sutured and dressing applied. Post procedure assessment: blood return through all ports, free fluid flow and no air  Patient tolerated the procedure well with no immediate complications.

## 2017-04-20 NOTE — Progress Notes (Signed)
Notified Dr. Cyndia Bent of repeat ABG; RR turned increased to 18 per Dr. Cyndia Bent.  Will repeat ABG at 1630.

## 2017-04-20 NOTE — Progress Notes (Signed)
Notified Dr. Cyndia Bent of ABG results; orders for one amp Bicarb, increase RR to 16 and O2 to 70%.  Will repeat ABG at 1530.

## 2017-04-20 NOTE — Op Note (Signed)
CARDIOVASCULAR SURGERY OPERATIVE NOTE  David [name] [id number] 04/20/2017   Surgeon:  Gaye Pollack, MD  First Assistant: Ivin Poot, MD  Preoperative Diagnosis: Class IV heart failure with LVEF 20% on home dobutamine 5 mcg/kg/min   Postoperative Diagnosis:  Same   Procedure  1. Median Sternotomy 2. Extracorporeal circulation 3. Aortic valve repair 4.   Implantation of HeartMate 3 Left Ventricular Assist Device.   Anesthesia:  General Endotracheal   Clinical History/Surgical Indication:  This 51 year old gentleman has ischemic cardiomyopathy with an ejection fraction of 15-20% and was placed on dobutamine for low output heart failure with [place] Heart Association class IV symptoms. He is symptomatically much better on dobutamine. He has been referred to [facility] consideration of heart transplantation but forfinancial reasons and his blood type O+ it is felt that proceeding with LVAD support is the best option. His last echocardiogram was done prior to dobutamine and showed mild right ventricular dysfunction. His right heart cath today showed well-compensated filling pressures with significant PAH with PA 64/26. PAPi was 5.4 and RA/PCWP 0.37. Echo yesterday showed mild AI, mild MR, mild TR. Mild RV systolic dysfunction.  I discussed the operative procedure of  HeartMate 3 implantation with the patient and his wife.  We discussed alternatives including heart transplantation and palliative medical therapy. We discussed the benefits and risks of surgery including but not limited to bleeding, blood transfusion, infection, stroke, persistent right ventricular dysfunction possibly requiring RVADsupport or prolonged intravenous inotropic support, postoperative organ dysfunction including renal failure and dialysis, pump thrombosis or malfunction, and death. All of their questions have been answered and he wishes to proceed.   Preparation:  The patient was seen in the  preoperative holding area and the correct patient, correct operation were confirmed with the patient after reviewing the medical record and catheterization. The consent was signed by me. Preoperative antibiotics were given. The patient was taken back to the operating room and positioned supine on the operating room table. After being placed under general endotracheal anesthesia by the anesthesia team a foley catheter was placed. A pulmonary arterial line and radial arterial line were placed by the anesthesia team. There was moderate pulmonary hypertension and the CVP was low at 6.  The neck, chest, abdomen, and both legs were prepped with betadine soap and solution and draped in the usual sterile manner. A surgical time-out was taken and the correct patient and operative procedure were confirmed with the nursing and anesthesia staff.   Pre-bypass TEE:   Complete TEE assessment was performed by Dr. Lillia Abed and reviewed by me. This showed a massively dilated LV cavity with EF of 20% or less. There was moderate AI visually with a pressure half-time of 359 ms. It was felt that this would require repair. There was moderate to severe MR. There was mild TR. The RV was not dilated and had mild systolic dysfunction. There was no PFO and the bubble study was negative.  Median sternotomy:    A median sternotomy was performed. The pericardium was opened in the midline. Right ventricular function appeared mildly depressed. The ascending aorta was of small size and had some palpable plaque in the proximal arch.  There were no contraindications to aortic cannulation or cross-clamping. The pericardium was opened along the left diaphragm out to the apex. The left diaphragm was taken down from the anterior chest wall over a short distance medially using electrocautery. Then a 4 cm transverse incision was made to the left of the umbilicus  and continued down to the rectus fascia using electrocautery. A skin punch was used  to create the drive line exit site about 3 cm below the right costal margin in the anterior axillary line. The straight tunneling device was passed in a subcutaneous plane from the transverse abdominal incision to a point midway to the pericardium and then through the rectus fascia into the pre-peritoneal space. Care was taken to make sure that the peritoneum was separated from the posterior rectus fascia at this location and the entrance site of the trocar into the preperitoneal space was observed while passing the trocar to prevent intraabdominal injury. The curved tunneling device was passed from the abdominal incision the the skin exit site.  Cardiopulmonary Bypass:   The patient was fully systemically heparinized and the ACT was maintained > 400 sec. The proximal aortic arch was cannulated with a 20 F aortic cannula for arterial inflow. Venous cannulation was performed via the right atrial appendage using a two-staged venous cannula. An antegrade cardioplegia and vent cannula was placed into the ascending aorta. A left ventricular vent was placed via the right superior pulmonary vein. Carbon dioxide was insufflated into the pericardium at 6L/min throughout the procedure to minimize intracardiac air. The systemic temperature was allowed to drift downward slightly during the procedure and the patient was actively rewarmed.  Aortic valve repair:   The aorta was cross-clamped and 1L of cold blood antegrade cardioplegia was given with quick arrest of the heart. Myocardial temp dropped to 10 degrees centigrade. The aorta was opened transversely about 1 cm above the RCA ostium. The aortic valve leaflets had some prolapse and there were fenestrations at the commissures allowing central AI. The valve was repaired by surgically closing the leaflets using interrupted 4-0 prolene pledgetted mattress sutures. This required two sutures along each line of coaptation and one central encircling suture through all  three leaflets. The aorta was closed using two layers of continuous 4-0 prolene suture. Coseal was applied. The crossclamp was removed with a time of 35 minutes and there was return of ventricular fibrillation. The heart was defibrillated to sinus rhythm. The patient was re-warmed.  Insertion of apical outflow cannula:  Laparotomy pads were placed behind the heart to aid exposure of the apex. A site was chosen on the anterior wall lateral to the LAD and superior to the true apex. A stab incision was made and a foley catheter placed through the coring knife and into the left ventricle. The balloon was inflated and with gentle traction on the catheter the coring knife was carefully advanced toward the mitral valve to create the ventriculotomy. The balloon was deflated and removed. A sump was placed into the LV and the ventricular core excised and sent to pathology. Trabeculations that might cause obstruction were excised. Then a series of 2-0 Ethibond horizontal mattress sutures were placed around the ventriculotomy. This took 13 sutures. The sutures were placed through the apical cuff. The sutures were tied and the apical cuff appeared to be well-sealed to the apex. Coseal was applied to seal needle holes. Then volume was left in the heart and the lungs inflated. The HeartMate 3 was brought onto the operative field. The inflow cannula was inserted into the apical cuff and the apical cuff lock engaged.  The outflow end of the pump was connected to the outflow graft prior to insertion on the back table by the nurse and HeartMate representative.  Then the heart and pump were deaired by filling the heart and  ventilating the lungs. With the LV distended the apical cuff appeared hemostatic. The drive line was then tunneled using the previously placed tunneling devices. The black line on the graft was oriented along the margin of the pericardium.  The heart and pump were returned to the pericardium and appeared to sit  nicely.   Aortic outflow graft anastomosis:  The outflow graft was distended and positioned along the inferior margin of the heart and around the right atrium. The outflow graft was cut to the appropriate length. The ascending aorta was partially occluded with a Lemole clamp. A slightly diagonall midline aortotomy was created and the ends enlarged with a 4.5 mm punch. The outflow graft was anastomosed to the aorta using 4-0 prolene pledgetted sutures at the toe and heal that were run down each side. Coseal was used to seal the needle holes in the graft. The Lemole clamp was removed and the graft clamped with a peripheral Debakey clamp and deaired using a 21 gauge needle. The aortic anastomosis was hemostatic.   Weaning from bypass:  The patient was started on Milrinone 0.25 mcg/kg/min, epinephrine 4 mcg/kg/min, levophed at 4 mcg/kg/min and nitric oxide at 20 ppm. The HeartMate 3 pump was started at 3000 rpm. TEE confirmed that the intracardiac air was removed.  There was no aortic insufficiency. Cardiopulmonary bypass flow was decreased to half flow and the clamp removed from the outflow graft. The speed was gradually increased to 4000 rpm. Flow was decreased to 1/4 and the speed gradually increased to 4800 rpm. Cardiopulmonary bypass was discontinued and the speed increased to 5300 rpm.  Pulmonary artery pressures were normal at 35/17 with a CVP of 6-8. CO was 4 L/min. CPB time was 137 minutes. TEE showed excellent apical cannula position. There was mild MR, trivial TR. RV function appeared good. The LV was smaller.  Completion:  A bipolar epicardial pacing wire was placed on the inferior wall. Two atrial pacing wires were placed.  Heparin was fully reversed with protamine and the aortic and venous cannulas removed. Hemostasis was achieved. Mediastinal drainage tubes, a pocket drain and a left pleural chest tube were placed.  The sternum was closed with  #6 stainless steel wires. The fascia was closed  with continuous # 1 vicryl suture. The subcutaneous tissue was closed with 2-0 vicryl continuous suture. The skin was closed with 3-0 vicryl subcuticular suture. The abdominal incision was closed with continuous 3-0 vicryl subcutaneous suture and 3-0 vicryl subcuticular skin suture. The drive line exit site was re-approximated  3-0 nylon skin sutures. The two drive line fasteners were applied. All sponge, needle, and instrument counts were reported correct at the end of the case. Dry sterile dressings were placed over the incisions and around the chest tubes which were connected to pleurevac suction. The patient was then transported to the surgical intensive care unit in critical but stable condition.

## 2017-04-20 NOTE — Anesthesia Procedure Notes (Signed)
Arterial Line Insertion Start/End12/27/2018 6:48 AM, 04/20/2017 6:52 AM Performed by: Wilburn Cornelia, CRNA, CRNA  Patient location: Pre-op. Preanesthetic checklist: patient identified, IV checked, risks and benefits discussed, surgical consent, monitors and equipment checked, pre-op evaluation and timeout performed Lidocaine 1% used for infiltration and patient sedated Left, radial was placed Catheter size: 20 G  Attempts: 1 Procedure performed without using ultrasound guided technique. Following insertion, Biopatch and dressing applied. Post procedure assessment: normal  Patient tolerated the procedure well with no immediate complications.

## 2017-04-20 NOTE — Progress Notes (Signed)
  Echocardiogram Echocardiogram Transesophageal has been performed.  David [name] 04/20/2017, 8:59 AM

## 2017-04-20 NOTE — Anesthesia Preprocedure Evaluation (Signed)
Anesthesia Evaluation  Patient identified by MRN, date of birth, ID band Patient awake    Reviewed: Allergy & Precautions, NPO status   Airway Mallampati: I  TM Distance: >3 FB Neck ROM: Full    Dental   Pulmonary former smoker,    Pulmonary exam normal        Cardiovascular hypertension, + CAD, + Past MI and +CHF  Normal cardiovascular exam+ Cardiac Defibrillator      Neuro/Psych    GI/Hepatic   Endo/Other    Renal/GU      Musculoskeletal   Abdominal   Peds  Hematology   Anesthesia Other Findings   Reproductive/Obstetrics                             Anesthesia Physical Anesthesia Plan  ASA: IV  Anesthesia Plan: General   Post-op Pain Management:    Induction: Intravenous  PONV Risk Score and Plan: 2 and Treatment may vary due to age or medical condition  Airway Management Planned: Oral ETT  Additional Equipment: Arterial line, CVP, PA Cath, TEE and Ultrasound Guidance Line Placement  Intra-op Plan:   Post-operative Plan: Post-operative intubation/ventilation  Informed Consent: I have reviewed the patients History and Physical, chart, labs and discussed the procedure including the risks, benefits and alternatives for the proposed anesthesia with the patient or authorized representative who has indicated his/her understanding and acceptance.     Plan Discussed with: CRNA and Surgeon  Anesthesia Plan Comments:         Anesthesia Quick Evaluation

## 2017-04-20 NOTE — Clinical Social Work Note (Signed)
CSW called patient's wife. CSW has been unable to go down the waiting room today. Patient's wife stated she is doing okay. She has multiple family members with her and has just received her third update from the OR. She stated he should be done around 2:00 or 2:30. Patient's wife stated she knows that she cannot spend the night in the ICU but plans on sleeping in a chair in the waiting room. CSW encouraged patient's wife to call if she needs anything. Patient's wife will save CSW's phone number in her phone.  Dayton Scrape, [place]

## 2017-04-20 NOTE — Transfer of Care (Signed)
Immediate Anesthesia Transfer of Care Note  Patient: David [name]  Procedure(s) Performed: INSERTION OF IMPLANTABLE LEFT VENTRICULAR ASSIST DEVICE, Aortic Valve repair (N/A Chest) TRANSESOPHAGEAL ECHOCARDIOGRAM (TEE) (N/A ) AORTIC VALVE REPAIR (N/A Chest)  Patient Location: SICU  Anesthesia Type:General  Level of Consciousness: Patient remains intubated per anesthesia plan  Airway & Oxygen Therapy: Patient remains intubated per anesthesia plan  Post-op Assessment: Report given to RN and Post -op Vital signs reviewed and stable  Post vital signs: Reviewed and stable  Last Vitals:  Vitals:   04/19/17 2240 04/20/17 0529  BP: (!) 105/59 119/69  Pulse: 65 77  Resp:    Temp: 36.6 C 36.6 C  SpO2: 93% 95%  HR 80 pacing AOO, LVAD in place - MAP 90, sats 98% placed on vent by respiratory.    Last Pain:  Vitals:   04/20/17 0529  TempSrc: Oral  PainSc:       Patients Stated Pain Goal: 0 (33/00/76 2263)  Complications: No apparent anesthesia complications

## 2017-04-20 NOTE — Progress Notes (Signed)
Advanced Heart Failure VAD Team Note  Subjective:    David [name] is a 51 y.o. male with a PMH of ICM ('09 PCI to LAD, mRCA, '10 PCI to Lcx and '12 mRCA), HFrEF (EF ~20% for >5 years), HLD and HTN admitted 12/26 for VAD placement due to end-stage systolic HF.   Underwent successful HM-3 LVAD placement today. Remains intubated and sedated.   On NO at 30ppm, milrinone 0.25, epi at , NE at 12  MAPs 70s  CVP 11 PA 45/227 CO/CI 3.8/2.1  LVAD INTERROGATION:  HeartMate III LVAD:  Flow 4.7 liters/min, speed 5300, power 4.0, PI 2.7.   Objective:    Vital Signs:   Temp:  [97.8 F (36.6 C)-100.6 F (38.1 C)] 99.3 F (37.4 C) (12/27 1800) Pulse Rate:  [30-114] 90 (12/27 1700) Resp:  [12-20] 20 (12/27 1800) BP: (79-119)/(58-79) 84/62 (12/27 1800) SpO2:  [90 %-96 %] 96 % (12/27 1700) Arterial Line BP: (74-142)/(55-107) 83/62 (12/27 1800) FiO2 (%):  [50 %-70 %] 70 % (12/27 1605) Last BM Date: 04/17/17 Mean arterial Pressure 70s  Intake/Output:   Intake/Output Summary (Last 24 hours) at 04/20/2017 1823 Last data filed at 04/20/2017 1800 Gross per 24 hour  Intake 4052.91 ml  Output 3078 ml  Net 974.91 ml     Physical Exam    General:  Intubated sedated HEENT: ETT Neck: supple. RIJ swan. JVP to jaw . Carotids 2+ bilat; no bruits. No lymphadenopathy or thyromegaly appreciated. Cor: Mechanical heart sounds with LVAD hum present. Sternal dressing and CTs ok Lungs: mechanical breath sounds Abdomen: soft, nontender, + distended. Nquiet Driveline: C/D/I; securement device intact and driveline incorporated Extremities: no cyanosis, clubbing, rash, 2+ edema Neuro: intubated sedate   Telemetry   paced  EKG    No new ECG  Labs   Basic Metabolic Panel: Recent Labs  Lab 04/19/17 1337 04/19/17 1928  04/20/17 1004 04/20/17 1048 04/20/17 1125 04/20/17 1228 04/20/17 1246 04/20/17 1403 04/20/17 1416  NA 136 134*   < > 138 136 136 137 139 140 135  K 3.2* 3.2*   <  > 3.6 3.9 4.4 3.6 3.4* 3.5 3.5  CL 101 101   < > 97* 94* 96* 101  --   --  103  CO2 26 27  --   --   --   --   --   --   --  24  GLUCOSE 104* 110*   < > 137* 171* 171* 150*  --  139* 143*  BUN 12 14   < > _0 --   --  10  CREATININE 1.41* 1.48*   < > 0.90 1.10 1.10 1.10  --   --  1.29*  CALCIUM 8.6* 8.3*  --   --   --   --   --   --   --  8.2*  MG  --   --   --   --   --   --   --   --   --  1.9   < > = values in this interval not displayed.    Liver Function Tests: Recent Labs  Lab 04/19/17 1928  AST 20  ALT 12*  ALKPHOS 100  BILITOT 1.1  PROT 6.5  ALBUMIN 2.3*   No results for input(s): LIPASE, AMYLASE in the last 168 hours. No results for input(s): AMMONIA in the last 168 hours.  CBC: Recent Labs  Lab 04/19/17 1337  04/20/17 1042  04/20/17  1125 04/20/17 1228 04/20/17 1246 04/20/17 1403 04/20/17 1416  WBC 9.0  --   --   --   --   --   --   --  23.0*  HGB 10.0*   < > 6.8*   < > 8.5* 8.8* 10.2* 12.6* 11.4*  HCT 31.4*   < > 21.4*   < > 25.0* 26.0* 30.0* 37.0* 36.4*  MCV 75.8*  --   --   --   --   --   --   --  78.3  PLT 332  --  224  --   --   --   --   --  184  208   < > = values in this interval not displayed.    INR: Recent Labs  Lab 04/19/17 1337 04/20/17 1416  INR 1.20 1.42  1.44    Other results:  Imaging   Dg Chest 2 View  Result Date: 04/19/2017 CLINICAL DATA:  Heart failure with preserved ejection fracture. History of CHF, hypertension, coronary artery disease, MI, seizures. Ex-smoker. EXAM: CHEST  2 VIEW COMPARISON:  Chest x-ray dated 03/10/2017. FINDINGS: Cardiomegaly is stable. Left chest wall pacemaker/ICD apparatus appears stable in configuration. Lungs are clear. No large pleural effusion or pneumothorax seen. IMPRESSION: No active cardiopulmonary disease. No evidence of pneumonia or pulmonary edema. Stable cardiomegaly Electronically Signed   By: Franki Cabot M.D.   On: 04/19/2017 20:39   Dg Chest Port 1 View  Result Date:  04/20/2017 CLINICAL DATA:  Post LVAD insertion EXAM: PORTABLE CHEST 1 VIEW COMPARISON:  04/19/2017, 03/13/2017 FINDINGS: Interval intubation, tip of the endotracheal tube is 2.6 cm superior to the carina. Interval sternotomy. Left-sided pacing generator with leads projecting over right atrium and right ventricle. Disconnected epicardial leads on the left side. Probable coronary stent. Right IJ Swan-Ganz catheter with distal portion of the catheter folded back upon itself and the tip directed cephalad over expected region of left pulmonary artery. Placement of chest drainage catheters over the left chest and mediastinum. No definite pneumothorax. Placement of LVAD. Cardiomegaly with mild pulmonary edema. Small pleural effusions and bibasilar atelectasis or consolidation. IMPRESSION: 1. Placement of support lines and tubes as above. Swan-Ganz catheter appears slightly kinked/folded back upon itself distally with the tip directed cephalad and slightly to the patient's left 2. Interval sternotomy changes with placement of LVAD device 3. Cardiomegaly with mild pulmonary edema and development of small pleural effusions and bibasilar consolidations. Electronically Signed   By: Donavan Foil M.D.   On: 04/20/2017 14:57      Medications:     Scheduled Medications: . [START ON 04/21/2017] acetaminophen  1,000 mg Oral Q6H   Or  . [START ON 04/21/2017] acetaminophen (TYLENOL) oral liquid 160 mg/5 mL  1,000 mg Per Tube Q6H  . amiodarone  200 mg Oral BID  . [START ON 04/21/2017] aspirin EC  325 mg Oral Daily   Or  . [START ON 04/21/2017] aspirin  324 mg Per Tube Daily   Or  . [START ON 04/21/2017] aspirin  300 mg Rectal Daily  . atorvastatin  40 mg Oral q1800  . [START ON 04/21/2017] bisacodyl  10 mg Oral Daily   Or  . [START ON 04/21/2017] bisacodyl  10 mg Rectal Daily  . chlorhexidine gluconate (MEDLINE KIT)  15 mL Mouth Rinse BID  . Chlorhexidine Gluconate Cloth  6 each Topical Daily  . [START ON  04/21/2017] docusate sodium  200 mg Oral Daily  . insulin aspart  0-24 Units Subcutaneous Q4H  . [START ON 04/21/2017] mouth rinse  15 mL Mouth Rinse QID  . mupirocin ointment  1 application Nasal BID  . [START ON 04/22/2017] pantoprazole  40 mg Oral Daily  . [START ON 04/21/2017] rifampin  600 mg Oral Once  . sodium chloride flush  10-40 mL Intracatheter Q12H  . [START ON 04/21/2017] sodium chloride flush  3 mL Intravenous Q12H     Infusions: . sodium chloride 20 mL/hr at 04/20/17 1500  . [START ON 04/21/2017] sodium chloride    . sodium chloride 20 mL/hr at 04/20/17 1436  . albumin human    . cefUROXime (ZINACEF)  IV    . dexmedetomidine (PRECEDEX) IV infusion 0.7 mcg/kg/hr (04/20/17 1740)  . EPINEPHrine 4 mg in dextrose 5% 250 mL infusion (16 mcg/mL) 3 mcg/min (04/20/17 1800)  . famotidine (PEPCID) IV Stopped (04/20/17 1516)  . [START ON 04/21/2017] fluconazole (DIFLUCAN) IV    . lactated ringers    . lactated ringers    . lactated ringers 20 mL/hr at 04/20/17 1435  . magnesium sulfate 4 g (04/20/17 1442)  . milrinone 0.25 mcg/kg/min (04/20/17 1400)  . norepinephrine (LEVOPHED) Adult infusion 12 mcg/min (04/20/17 1800)  . vancomycin       PRN Medications:  sodium chloride, albumin human, lactated ringers, linaclotide, midazolam, morphine injection, morphine injection, ondansetron (ZOFRAN) IV, oxyCODONE, sodium chloride flush, [START ON 04/21/2017] sodium chloride flush, traMADol   Patient Profile   David [name] is a 51 y.o. male with a PMH of ICM ('09 PCI to LAD, mRCA, '10 PCI to Lcx and '12 mRCA), HFrEF (EF ~20% for >5 years) s/p HM-III VAD placement on 12/27 due to end-stage systolic HF.   Assessment/Plan:    1. Acute on chronic systolic HF - Echo 79/89/21 with LVEF 20-25%, Mild MR, Mild/Mod MR, Severe LAE, Mildly reduced RV, PA peak pressure 56 mm Hg. - S/p HM-III VAD placement today. Remains intubated sedated on pressors and NO  - Continue to support with drips  as needed. Co-ox 70% - hopefully can extubate in am  - VAD interrogated personally. Parameters stable.  2. CAD s/p PCI to mid RCA and PLOM with DES x 2 and DES to mid LAD in 1/18. -No s/s of ischemia - Continue ASA and statin   3. PAF - Maintaining NSR post-op . Continue amio   4. CKD stage III - Stable. Follow post-op   5. ABLA, expected - transfuse as needed  6. Acute respiratory failure - Wean vent as tolerated post-op   CRITICAL CARE Performed by: Glori Bickers  Total critical care time: 35 minutes  Critical care time was exclusive of separately billable procedures and treating other patients.  Critical care was necessary to treat or prevent imminent or life-threatening deterioration.  Critical care was time spent personally by me (independent of midlevel providers or residents) on the following activities: development of treatment plan with patient and/or surrogate as well as nursing, discussions with consultants, evaluation of patient's response to treatment, examination of patient, obtaining history from patient or surrogate, ordering and performing treatments and interventions, ordering and review of laboratory studies, ordering and review of radiographic studies, pulse oximetry and re-evaluation of patient's condition.    Length of Stay: 1  Glori Bickers, MD 04/20/2017, 6:23 PM  VAD Team --- VAD ISSUES ONLY--- Pager [phone] (7am - 7am)  Advanced Heart Failure Team  Pager [phone] (M-F; 7a - 4p)  Please contact [facility] for night-coverage after hours (4p -7a )  and weekends on [email or web address]

## 2017-04-21 ENCOUNTER — Other Ambulatory Visit (HOSPITAL_COMMUNITY): Payer: Self-pay | Admitting: Internal Medicine

## 2017-04-21 ENCOUNTER — Encounter (HOSPITAL_COMMUNITY): Payer: Self-pay | Admitting: Surgery

## 2017-04-21 ENCOUNTER — Inpatient Hospital Stay (HOSPITAL_COMMUNITY): Payer: Medicare HMO

## 2017-04-21 DIAGNOSIS — I5023 Acute on chronic systolic (congestive) heart failure: Secondary | ICD-10-CM

## 2017-04-21 DIAGNOSIS — Z95811 Presence of heart assist device: Secondary | ICD-10-CM

## 2017-04-21 LAB — POCT I-STAT 3, ART BLOOD GAS (G3+)
ACID-BASE DEFICIT: 3 mmol/L — AB (ref 0.0–2.0)
ACID-BASE DEFICIT: 5 mmol/L — AB (ref 0.0–2.0)
Acid-base deficit: 5 mmol/L — ABNORMAL HIGH (ref 0.0–2.0)
Acid-base deficit: 1 mmol/L (ref 0.0–2.0)
Acid-base deficit: 3 mmol/L — ABNORMAL HIGH (ref 0.0–2.0)
Acid-base deficit: 2 mmol/L (ref 0.0–2.0)
Bicarbonate: 20.3 mmol/L (ref 20.0–28.0)
Bicarbonate: 20.5 mmol/L (ref 20.0–28.0)
Bicarbonate: 21.3 mmol/L (ref 20.0–28.0)
Bicarbonate: 21.3 mmol/L (ref 20.0–28.0)
Bicarbonate: 23.1 mmol/L (ref 20.0–28.0)
Bicarbonate: 24.5 mmol/L (ref 20.0–28.0)
O2 SAT: 87 %
O2 SAT: 97 %
O2 SAT: 92 %
O2 Saturation: 100 %
O2 Saturation: 92 %
O2 Saturation: 99 %
PCO2 ART: 67.6 mmHg — AB (ref 32.0–48.0)
PCO2 ART: 27.6 mmHg — AB (ref 32.0–48.0)
PH ART: 7.173 — AB (ref 7.350–7.450)
PO2 ART: 197 mmHg — AB (ref 83.0–108.0)
Patient temperature: 37.9
Patient temperature: 38.4
TCO2: 21 mmol/L — ABNORMAL LOW (ref 22–32)
TCO2: 21 mmol/L — ABNORMAL LOW (ref 22–32)
TCO2: 22 mmol/L (ref 22–32)
TCO2: 22 mmol/L (ref 22–32)
TCO2: 24 mmol/L (ref 22–32)
TCO2: 26 mmol/L (ref 22–32)
pCO2 arterial: 34.8 mmHg (ref 32.0–48.0)
pCO2 arterial: 35.9 mmHg (ref 32.0–48.0)
pCO2 arterial: 38.4 mmHg (ref 32.0–48.0)
pCO2 arterial: 38.9 mmHg (ref 32.0–48.0)
pH, Arterial: 7.335 — ABNORMAL LOW (ref 7.350–7.450)
pH, Arterial: 7.386 (ref 7.350–7.450)
pH, Arterial: 7.386 (ref 7.350–7.450)
pH, Arterial: 7.398 (ref 7.350–7.450)
pH, Arterial: 7.484 — ABNORMAL HIGH (ref 7.350–7.450)
pO2, Arterial: 71 mmHg — ABNORMAL LOW (ref 83.0–108.0)
pO2, Arterial: 152 mmHg — ABNORMAL HIGH (ref 83.0–108.0)
pO2, Arterial: 100 mmHg (ref 83.0–108.0)
pO2, Arterial: 67 mmHg — ABNORMAL LOW (ref 83.0–108.0)
pO2, Arterial: 63 mmHg — ABNORMAL LOW (ref 83.0–108.0)

## 2017-04-21 LAB — COOXEMETRY PANEL
CARBOXYHEMOGLOBIN: 1.1 % (ref 0.5–1.5)
CARBOXYHEMOGLOBIN: 1.2 % (ref 0.5–1.5)
CARBOXYHEMOGLOBIN: 1.2 % (ref 0.5–1.5)
CARBOXYHEMOGLOBIN: 1.2 % (ref 0.5–1.5)
Carboxyhemoglobin: 1.3 % (ref 0.5–1.5)
Carboxyhemoglobin: 1.2 % (ref 0.5–1.5)
METHEMOGLOBIN: 1.3 % (ref 0.0–1.5)
METHEMOGLOBIN: 1.4 % (ref 0.0–1.5)
METHEMOGLOBIN: 1.4 % (ref 0.0–1.5)
Methemoglobin: 1.3 % (ref 0.0–1.5)
Methemoglobin: 1.5 % (ref 0.0–1.5)
Methemoglobin: 1.5 % (ref 0.0–1.5)
O2 SAT: 64.4 %
O2 SAT: 56.9 %
O2 SAT: 43.9 %
O2 Saturation: 44 %
O2 Saturation: 51.2 %
O2 Saturation: 65.7 %
TOTAL HEMOGLOBIN: 8.7 g/dL — AB (ref 12.0–16.0)
Total hemoglobin: 10.2 g/dL — ABNORMAL LOW (ref 12.0–16.0)
Total hemoglobin: 11.3 g/dL — ABNORMAL LOW (ref 12.0–16.0)
Total hemoglobin: 8 g/dL — ABNORMAL LOW (ref 12.0–16.0)
Total hemoglobin: 8.1 g/dL — ABNORMAL LOW (ref 12.0–16.0)
Total hemoglobin: 8.2 g/dL — ABNORMAL LOW (ref 12.0–16.0)

## 2017-04-21 LAB — GLUCOSE, CAPILLARY
GLUCOSE-CAPILLARY: 188 mg/dL — AB (ref 65–99)
GLUCOSE-CAPILLARY: 136 mg/dL — AB (ref 65–99)
GLUCOSE-CAPILLARY: 108 mg/dL — AB (ref 65–99)
GLUCOSE-CAPILLARY: 110 mg/dL — AB (ref 65–99)
Glucose-Capillary: 196 mg/dL — ABNORMAL HIGH (ref 65–99)
Glucose-Capillary: 132 mg/dL — ABNORMAL HIGH (ref 65–99)
Glucose-Capillary: 78 mg/dL (ref 65–99)

## 2017-04-21 LAB — BPAM FFP
BLOOD PRODUCT EXPIRATION DATE: 201812272359
BLOOD PRODUCT EXPIRATION DATE: 201812282359
Blood Product Expiration Date: 201812272359
Blood Product Expiration Date: 201812282359
ISSUE DATE / TIME: 201812270924
ISSUE DATE / TIME: 201812270924
ISSUE DATE / TIME: 201812270924
ISSUE DATE / TIME: 201812270924
UNIT TYPE AND RH: 6200
UNIT TYPE AND RH: 7300
Unit Type and Rh: 6200
Unit Type and Rh: 7300

## 2017-04-21 LAB — COMPREHENSIVE METABOLIC PANEL
ALT: 19 U/L (ref 17–63)
AST: 95 U/L — AB (ref 15–41)
Albumin: 3 g/dL — ABNORMAL LOW (ref 3.5–5.0)
Alkaline Phosphatase: 61 U/L (ref 38–126)
Anion gap: 10 (ref 5–15)
BILIRUBIN TOTAL: 4 mg/dL — AB (ref 0.3–1.2)
BUN: 13 mg/dL (ref 6–20)
CO2: 21 mmol/L — ABNORMAL LOW (ref 22–32)
CREATININE: 1.6 mg/dL — AB (ref 0.61–1.24)
Calcium: 8.2 mg/dL — ABNORMAL LOW (ref 8.9–10.3)
Chloride: 103 mmol/L (ref 101–111)
GFR, EST AFRICAN AMERICAN: 55 mL/min — AB (ref >60)
GFR, EST NON AFRICAN AMERICAN: 48 mL/min — AB (ref >60)
Glucose, Bld: 188 mg/dL — ABNORMAL HIGH (ref 65–99)
Potassium: 4.5 mmol/L (ref 3.5–5.1)
Sodium: 134 mmol/L — ABNORMAL LOW (ref 135–145)
TOTAL PROTEIN: 5.6 g/dL — AB (ref 6.5–8.1)

## 2017-04-21 LAB — LACTATE DEHYDROGENASE: LDH: 407 U/L — AB (ref 98–192)

## 2017-04-21 LAB — PREPARE FRESH FROZEN PLASMA
UNIT DIVISION: 0
Unit division: 0
Unit division: 0
Unit division: 0

## 2017-04-21 LAB — HEMOGLOBIN AND HEMATOCRIT, BLOOD
HCT: 21.4 % — ABNORMAL LOW (ref 39.0–52.0)
Hemoglobin: 6.8 g/dL — CL (ref 13.0–17.0)

## 2017-04-21 LAB — POCT I-STAT, CHEM 8
BUN: 14 mg/dL (ref 6–20)
CREATININE: 1.4 mg/dL — AB (ref 0.61–1.24)
Calcium, Ion: 1.11 mmol/L — ABNORMAL LOW (ref 1.15–1.40)
Chloride: 100 mmol/L — ABNORMAL LOW (ref 101–111)
Glucose, Bld: 104 mg/dL — ABNORMAL HIGH (ref 65–99)
HEMATOCRIT: 28 % — AB (ref 39.0–52.0)
HEMOGLOBIN: 9.5 g/dL — AB (ref 13.0–17.0)
POTASSIUM: 4.6 mmol/L (ref 3.5–5.1)
Sodium: 134 mmol/L — ABNORMAL LOW (ref 135–145)
TCO2: 22 mmol/L (ref 22–32)

## 2017-04-21 LAB — PHOSPHORUS: PHOSPHORUS: 3.1 mg/dL (ref 2.5–4.6)

## 2017-04-21 LAB — CBC
HCT: 26.8 % — ABNORMAL LOW (ref 39.0–52.0)
Hemoglobin: 8.6 g/dL — ABNORMAL LOW (ref 13.0–17.0)
MCH: 24.8 pg — ABNORMAL LOW (ref 26.0–34.0)
MCHC: 32.1 g/dL (ref 30.0–36.0)
MCV: 77.2 fL — ABNORMAL LOW (ref 78.0–100.0)
PLATELETS: 154 10*3/uL (ref 150–400)
RBC: 3.47 MIL/uL — ABNORMAL LOW (ref 4.22–5.81)
RDW: 17.8 % — AB (ref 11.5–15.5)
WBC: 28.9 10*3/uL — AB (ref 4.0–10.5)

## 2017-04-21 LAB — CBC WITH DIFFERENTIAL/PLATELET
BASOS ABS: 0 10*3/uL (ref 0.0–0.1)
BASOS PCT: 0 %
EOS ABS: 0 10*3/uL (ref 0.0–0.7)
EOS PCT: 0 %
HCT: 27.6 % — ABNORMAL LOW (ref 39.0–52.0)
HEMOGLOBIN: 8.9 g/dL — AB (ref 13.0–17.0)
Lymphocytes Relative: 8 %
Lymphs Abs: 1.5 10*3/uL (ref 0.7–4.0)
MCH: 24.9 pg — ABNORMAL LOW (ref 26.0–34.0)
MCHC: 32.2 g/dL (ref 30.0–36.0)
MCV: 77.3 fL — ABNORMAL LOW (ref 78.0–100.0)
Monocytes Absolute: 1.5 10*3/uL — ABNORMAL HIGH (ref 0.1–1.0)
Monocytes Relative: 8 %
NEUTROS PCT: 84 %
Neutro Abs: 15.5 10*3/uL — ABNORMAL HIGH (ref 1.7–7.7)
PLATELETS: 177 10*3/uL (ref 150–400)
RBC: 3.57 MIL/uL — AB (ref 4.22–5.81)
RDW: 17.3 % — ABNORMAL HIGH (ref 11.5–15.5)
WBC: 18.5 10*3/uL — AB (ref 4.0–10.5)

## 2017-04-21 LAB — PROTIME-INR
INR: 1.34
Prothrombin Time: 16.4 seconds — ABNORMAL HIGH (ref 11.4–15.2)

## 2017-04-21 LAB — CREATININE, SERUM
Creatinine, Ser: 1.54 mg/dL — ABNORMAL HIGH (ref 0.61–1.24)
GFR, EST AFRICAN AMERICAN: 58 mL/min — AB (ref >60)
GFR, EST NON AFRICAN AMERICAN: 50 mL/min — AB (ref >60)

## 2017-04-21 LAB — CALCIUM, IONIZED: Calcium, Ionized, Serum: 4.5 mg/dL (ref 4.5–5.6)

## 2017-04-21 LAB — MAGNESIUM
MAGNESIUM: 2.3 mg/dL (ref 1.7–2.4)
MAGNESIUM: 1.9 mg/dL (ref 1.7–2.4)

## 2017-04-21 LAB — BRAIN NATRIURETIC PEPTIDE: B NATRIURETIC PEPTIDE 5: 1171.7 pg/mL — AB (ref 0.0–100.0)

## 2017-04-21 MED ORDER — FUROSEMIDE 10 MG/ML IJ SOLN
80.0000 mg | Freq: Once | INTRAMUSCULAR | Status: AC
  Administered 2017-04-21: 80 mg via INTRAVENOUS
  Filled 2017-04-21: qty 8

## 2017-04-21 MED ORDER — WARFARIN SODIUM 2.5 MG PO TABS
2.5000 mg | ORAL_TABLET | Freq: Every day | ORAL | Status: DC
  Administered 2017-04-21: 2.5 mg via ORAL
  Filled 2017-04-21: qty 1

## 2017-04-21 MED ORDER — HYDRALAZINE HCL 20 MG/ML IJ SOLN
10.0000 mg | INTRAMUSCULAR | Status: DC | PRN
  Administered 2017-04-21: 10 mg via INTRAVENOUS
  Filled 2017-04-21: qty 1

## 2017-04-21 MED ORDER — WARFARIN - PHYSICIAN DOSING INPATIENT
Freq: Every day | Status: DC
  Administered 2017-04-21 – 2017-05-02 (×8)

## 2017-04-21 NOTE — Progress Notes (Signed)
LVAD Coordinator Rounding Note:  Admitted 12/26 due to decompensated heart failure and pump placement.   Heartmate III LVAD implanted on 04/20/17 by Gilford Raid under DT criteria.  Vital signs: Tmax: 100.8 HR: 90 Doppler Pressure:76 Automatic BP: 82/70 O2 Sat: 98% on 40% FiO2 Wt: 165 lbs   LVAD interrogation reveals:   Speed: 5300 Flow: 4.1 Power:  3.8 PI: 4.6 Alarms: none Events:  4 PI  Hematocrit: 27 Fixed speed: 5300 Low speed limit: 5000   Drive Line: VAD Coordinator performed dressing change at bedside. VAD dressing removed and site care performed using sterile technique. Drive line exit site cleaned with Chlora prep applicators x 2, allowed to dry, and gauze dressing with silver re-applied. Exit site healing and unincorporated, the velour is fully implanted at exit site. No redness, tenderness, foul odor or rash noted. Small amount of bloody drainage from surgery. There is only 1 sutured noted. Drive line secured in anchor.    Labs:  LDH trend: 205>407  INR trend: 1.34  Anticoagulation Plan: -INR Goal: 2-2.5 -ASA Dose: 325 mg  Blood Products:  Intra op: 2 units/FFP 04/20/17   Device: Medtronic BiV -Therapies: off   Respiratory: Intubated currently but RT at bedside to perform extubation   Renal:  -BUN/CRT: 13/1.6  Gtts: Levophed 5 mcg/min Epi 4 mcg/min Precedex 0.1 mcg/kg/hr Milrinone 0.25 mcg/kg/min    Plan/Recommendations:   1. Please incorporate wife over the weekend to observe dressing change. 2. Please page VAD coordinator for equipment issues or concerns.   Tanda Rockers RN, BSN VAD Coordinator 24/7 Pager [phone]

## 2017-04-21 NOTE — Procedures (Signed)
Extubation Procedure Note  Patient Details:   Name: Hovanes Hymas DOB: August 03, 1965 MRN: [id number]   Airway Documentation:     Evaluation  O2 sats: stable throughout Complications: No apparent complications Patient did tolerate procedure well. Bilateral Breath Sounds: Clear, Diminished   Yes Patient tolerated rapid wean. NIF -35 and VC 0.8L. Positive for cuff leak. Patient extubated to a 5 Lpm nasal cannula with 3ppm NO bled in. No stridor noted. Patient with tachypnea post extubation. RN at bedside. Will continue to monitor. Patient instructed on the Incentive Spirometer achieving 500 mL five times.    Myrtie Neither 04/21/2017, 9:28 AM

## 2017-04-21 NOTE — Progress Notes (Signed)
Paged MD concerning pts MAP 95-105. MD placed orders to administer hydralazine 10mg  Q2prn for MAP >85

## 2017-04-21 NOTE — Progress Notes (Signed)
Pt performing IS Q2 hr with RN. Goal 1000, pt achieving 600-750. RN will continue to monitor.

## 2017-04-21 NOTE — Progress Notes (Signed)
Pt RR in 30-40s, MAP 90s, HR 90-100s, Temp 101. WBC trend 18.5--28.9.  Pt given pain medication and scheduled tylenol.  Room temp decreased. MD aware.

## 2017-04-21 NOTE — Progress Notes (Signed)
Advanced Heart Failure VAD Team Note  Subjective:    David [name] is a 51 y.o. male with a PMH of ICM ('09 PCI to LAD, mRCA, '10 PCI to Lcx and '12 mRCA), HFrEF (EF ~20% for >5 years), HLD and HTN admitted 12/26 for VAD placement due to end-stage systolic HF.   Underwent successful HM-3 LVAD placement 04/20/17.  Tmax 100.6.  Remains intubated and sedated. Stirs to voice and nods head to questioning.   On NO at 3 ppm, milrinone 0.25, epi at 4, NE at 5.   MAPs 62 this am.   Luiz Blare numbers reviewed personally.  CVP 11 PA 34/17 CO/CI 4.5/2.5  LVAD INTERROGATION:  HeartMate III LVAD:  Flow 4.2 liters/min, speed 5300, power 4.0, PI 2.5. 1 PI event.  Objective:    Vital Signs:   Temp:  [98.8 F (37.1 C)-100.6 F (38.1 C)] 100.6 F (38.1 C) (12/28 0700) Pulse Rate:  [30-114] 30 (12/27 2030) Resp:  [0-25] 20 (12/28 0700) BP: (68-97)/(53-81) 79/56 (12/28 0700) SpO2:  [90 %-100 %] 100 % (12/28 0300) Arterial Line BP: (66-142)/(51-107) 87/62 (12/28 0700) FiO2 (%):  [50 %-70 %] 50 % (12/28 0729) Weight:  [165 lb 5.5 oz (75 kg)] 165 lb 5.5 oz (75 kg) (12/28 0500) Last BM Date: 04/17/17 Mean arterial Pressure 60-70s  Intake/Output:   Intake/Output Summary (Last 24 hours) at 04/21/2017 0749 Last data filed at 04/21/2017 0700 Gross per 24 hour  Intake 6220.91 ml  Output 4048 ml  Net 2172.91 ml     Physical Exam    GENERAL: Intubated and sedated. HEENT: ETT. NECK: Supple, RIJ swan, JVP to swan. 7-8 cm. Carotids OK.  CARDIAC:  Mechanical heart sounds with LVAD hum present. Sternal dressing and CTs stable.  LUNGS:  CTAB, normal effort.  ABDOMEN:  NT, ND, no HSM. No bruits or masses. +BS  LVAD exit site: Dressing dry and intact. No erythema or drainage. Stabilization device present and accurately applied. Driveline dressing changed daily per sterile technique. EXTREMITIES:  Warm and dry. No cyanosis, clubbing, or rash. 2+ edema.  NEUROLOGIC:  Intubated and  sedated.  Telemetry   V paced, personally reviewed.  EKG    No new tracings.   Labs   Basic Metabolic Panel: Recent Labs  Lab 04/19/17 1337 04/19/17 1928  04/20/17 1125 04/20/17 1228 04/20/17 1246 04/20/17 1403 04/20/17 1416 04/20/17 1935 04/20/17 1953 04/21/17 0259  NA 136 134*   < > 136 137 139 140 135  --  135 134*  K 3.2* 3.2*   < > 4.4 3.6 3.4* 3.5 3.5  --  5.1 4.5  CL 101 101   < > 96* 101  --   --  103  --  103 103  CO2 26 27  --   --   --   --   --  24  --   --  21*  GLUCOSE 104* 110*   < > 171* 150*  --  139* 143*  --  168* 188*  BUN 12 14   < > 10 11  --   --  10  --  11 13  CREATININE 1.41* 1.48*   < > 1.10 1.10  --   --  1.29* 1.60* 1.30* 1.60*  CALCIUM 8.6* 8.3*  --   --   --   --   --  8.2*  --   --  8.2*  MG  --   --   --   --   --   --   --  1.9 3.1*  --  2.3  PHOS  --   --   --   --   --   --   --   --   --   --  3.1   < > = values in this interval not displayed.    Liver Function Tests: Recent Labs  Lab 04/19/17 1928 04/21/17 0259  AST 20 95*  ALT 12* 19  ALKPHOS 100 61  BILITOT 1.1 4.0*  PROT 6.5 5.6*  ALBUMIN 2.3* 3.0*   No results for input(s): LIPASE, AMYLASE in the last 168 hours. No results for input(s): AMMONIA in the last 168 hours.  CBC: Recent Labs  Lab 04/19/17 1337  04/20/17 1042  04/20/17 1403 04/20/17 1416 04/20/17 1935 04/20/17 1953 04/21/17 0259  WBC 9.0  --   --   --   --  23.0* 20.3*  --  18.5*  NEUTROABS  --   --   --   --   --   --   --   --  15.5*  HGB 10.0*   < > 6.8*   < > 12.6* 11.4* 9.6* 9.5* 8.9*  HCT 31.4*   < > 21.4*   < > 37.0* 36.4* 30.5* 28.0* 27.6*  MCV 75.8*  --   --   --   --  78.3 78.4  --  77.3*  PLT 332  --  224  --   --  184  208 194  --  177   < > = values in this interval not displayed.    INR: Recent Labs  Lab 04/19/17 1337 04/20/17 1416 04/21/17 0259  INR 1.20 1.42  1.44 1.34    Other results:  Imaging   Dg Chest 2 View  Result Date: 04/19/2017 CLINICAL DATA:  Heart  failure with preserved ejection fracture. History of CHF, hypertension, coronary artery disease, MI, seizures. Ex-smoker. EXAM: CHEST  2 VIEW COMPARISON:  Chest x-ray dated 03/10/2017. FINDINGS: Cardiomegaly is stable. Left chest wall pacemaker/ICD apparatus appears stable in configuration. Lungs are clear. No large pleural effusion or pneumothorax seen. IMPRESSION: No active cardiopulmonary disease. No evidence of pneumonia or pulmonary edema. Stable cardiomegaly Electronically Signed   By: Franki Cabot M.D.   On: 04/19/2017 20:39   Dg Chest Port 1 View  Result Date: 04/21/2017 CLINICAL DATA:  Left ventricular assist device, endotracheal tube. EXAM: PORTABLE CHEST 1 VIEW COMPARISON:  04/20/2017 and CT chest 03/13/2017. FINDINGS: Endotracheal tube terminates 4.5 cm above the carina. Right IJ Swan-Ganz catheter is coiled in the left pulmonary artery, as before. Pacemaker and ICD lead tips project over the right atrium and right ventricle. Left ventricular assist device is in place. Left chest tubes are stable in position. Trachea is midline. Heart is enlarged. Mild basilar predominant mixed interstitial and airspace opacification, similar to minimally improved from 04/20/2017. Left costophrenic angle is obscured by the LVAD. No definite right pleural fluid. IMPRESSION: 1. Stable support tubes/lines with the left IJ Swan-Ganz catheter tip in the left pulmonary artery. 2. Pulmonary edema, similar to minimally improved. 3. Mild bibasilar atelectasis. Electronically Signed   By: Lorin Picket M.D.   On: 04/21/2017 07:27   Dg Chest Port 1 View  Result Date: 04/20/2017 CLINICAL DATA:  Post LVAD insertion EXAM: PORTABLE CHEST 1 VIEW COMPARISON:  04/19/2017, 03/13/2017 FINDINGS: Interval intubation, tip of the endotracheal tube is 2.6 cm superior to the carina. Interval sternotomy. Left-sided pacing generator with leads projecting over right atrium and right ventricle. Disconnected  epicardial leads on the left  side. Probable coronary stent. Right IJ Swan-Ganz catheter with distal portion of the catheter folded back upon itself and the tip directed cephalad over expected region of left pulmonary artery. Placement of chest drainage catheters over the left chest and mediastinum. No definite pneumothorax. Placement of LVAD. Cardiomegaly with mild pulmonary edema. Small pleural effusions and bibasilar atelectasis or consolidation. IMPRESSION: 1. Placement of support lines and tubes as above. Swan-Ganz catheter appears slightly kinked/folded back upon itself distally with the tip directed cephalad and slightly to the patient's left 2. Interval sternotomy changes with placement of LVAD device 3. Cardiomegaly with mild pulmonary edema and development of small pleural effusions and bibasilar consolidations. Electronically Signed   By: Donavan Foil M.D.   On: 04/20/2017 14:57     Medications:     Scheduled Medications: . acetaminophen  1,000 mg Oral Q6H   Or  . acetaminophen (TYLENOL) oral liquid 160 mg/5 mL  1,000 mg Per Tube Q6H  . amiodarone  200 mg Oral BID  . aspirin EC  325 mg Oral Daily   Or  . aspirin  324 mg Per Tube Daily   Or  . aspirin  300 mg Rectal Daily  . atorvastatin  40 mg Oral q1800  . bisacodyl  10 mg Oral Daily   Or  . bisacodyl  10 mg Rectal Daily  . chlorhexidine gluconate (MEDLINE KIT)  15 mL Mouth Rinse BID  . Chlorhexidine Gluconate Cloth  6 each Topical Daily  . docusate sodium  200 mg Oral Daily  . insulin aspart  0-24 Units Subcutaneous Q4H  . levalbuterol  1.25 mg Nebulization Q6H  . mouth rinse  15 mL Mouth Rinse QID  . mupirocin ointment  1 application Nasal BID  . [START ON 04/22/2017] pantoprazole  40 mg Oral Daily  . rifampin  600 mg Oral Once  . sodium chloride flush  10-40 mL Intracatheter Q12H  . sodium chloride flush  3 mL Intravenous Q12H    Infusions: . sodium chloride 20 mL/hr at 04/20/17 1500  . sodium chloride    . sodium chloride 20 mL/hr at 04/20/17  1436  . albumin human    . cefUROXime (ZINACEF)  IV Stopped (04/20/17 2237)  . dexmedetomidine (PRECEDEX) IV infusion 0.7 mcg/kg/hr (04/21/17 0325)  . EPINEPHrine 4 mg in dextrose 5% 250 mL infusion (16 mcg/mL) 4 mcg/min (04/21/17 0552)  . lactated ringers    . lactated ringers    . lactated ringers 20 mL/hr at 04/20/17 1435  . milrinone 0.25 mcg/kg/min (04/21/17 0210)  . norepinephrine (LEVOPHED) Adult infusion 5 mcg/min (04/21/17 0733)  . vancomycin Stopped (04/20/17 2108)    PRN Medications: sodium chloride, albumin human, lactated ringers, linaclotide, midazolam, morphine injection, ondansetron (ZOFRAN) IV, oxyCODONE, sodium chloride flush, sodium chloride flush, traMADol   Patient Profile   David [name] is a 51 y.o. male with a PMH of ICM ('09 PCI to LAD, mRCA, '10 PCI to Lcx and '12 mRCA), HFrEF (EF ~20% for >5 years) s/p HM-III VAD placement on 12/27 due to end-stage systolic HF.   Assessment/Plan:    1. Acute on chronic systolic HF - Echo 84/69/62 with LVEF 20-25%, Mild MR, Mild/Mod MR, Severe LAE, Mildly reduced RV, PA peak pressure 56 mm Hg. - S/p HM-III VAD placement 04/20/17. Remains intubated sedated on pressors and NO, weaning now for possible extubation this am.  - Continue to wean drips as tolerated. Coox 64%.  - Volume status elevated, but with  soft MAPs and low PIs, will hold diuretics for now.  - VAD interrogated personally. Parameters stable.   2. CAD s/p PCI to mid RCA and PLOM with DES x 2 and DES to mid LAD in 1/18. -No s/s of ischemia.    - Continue ASA and statin   3. PAF - Maintaining NSR post-op. - Continue po amio. If exacerbated post op can use IV amio prn.   4. AKI on CKD stage III - Relatively stable. Continue to follow.   5. ABLA, expected - Hgb 8.9 this am.  - Follow closely for need for transfusion.   6. Acute respiratory failure - Wean vent as tolerated post-op. ? Extubation today.   Length of Stay: 2  Annamaria Helling 04/21/2017, 7:49 AM  VAD Team --- VAD ISSUES ONLY--- Pager [phone] (7am - 7am)  Advanced Heart Failure Team  Pager [phone] (M-F; 7a - 4p)  Please contact [facility] for night-coverage after hours (4p -7a ) and weekends on [email or web address]  Agree.  Remains on vent but responds to voice. Moves all 4. On NO at 3 ppm, milrinone 0.25, epi at 4, NE at 5. MAPs soft.   Swan numbers look good. Remains in NSR. Co-ox 57%. Mild edema on CXR (improved)  Exam Sedated on vent but will awaken RIJ swan Cor RRR Sternal dressing and CTs ok Ab: mildly distended. Quiet. Driveline site ok  Ext: warm minimal edema  Stable POD#1 VAD. Hope to extubate today. Wean drips as tolerated. VAD interrogated personally. Parameters stable. May need to drop speed a bit. Diurese as tolerated.   CRITICAL CARE Performed by: Glori Bickers  Total critical care time: 35 minutes  Critical care time was exclusive of separately billable procedures and treating other patients.  Critical care was necessary to treat or prevent imminent or life-threatening deterioration.  Critical care was time spent personally by me (independent of midlevel providers or residents) on the following activities: development of treatment plan with patient and/or surrogate as well as nursing, discussions with consultants, evaluation of patient's response to treatment, examination of patient, obtaining history from patient or surrogate, ordering and performing treatments and interventions, ordering and review of laboratory studies, ordering and review of radiographic studies, pulse oximetry and re-evaluation of patient's condition.  Glori Bickers, MD  8:54 AM

## 2017-04-21 NOTE — Progress Notes (Signed)
Patient ID: David [name], male   DOB: Jul 27, 1965, 51 y.o.   MRN: [id number] HeartMate 3 Rounding Note  Subjective:    Intubated and on Precedex but wakes up and responds. NO weaned to 3 ppm overnight without change in hemodynamics. Co-ox 64.4 this am. CI 2.5 on milrinone 0.25, epi 3, levophed 5 CVP 11, PA 34/19  LVAD INTERROGATION:  HeartMate IIL LVAD:  Flow 3.9 liters/min, speed 5300, power 3.7, PI 1.8.  Two PI events overnight.  Objective:    Vital Signs:   Temp:  [98.8 F (37.1 C)-100.6 F (38.1 C)] 100.6 F (38.1 C) (12/28 0700) Pulse Rate:  [30-114] 30 (12/27 2030) Resp:  [0-25] 20 (12/28 0700) BP: (68-97)/(53-81) 79/56 (12/28 0700) SpO2:  [90 %-100 %] 100 % (12/28 0300) Arterial Line BP: (66-142)/(51-107) 87/62 (12/28 0700) FiO2 (%):  [50 %-70 %] 50 % (12/28 0729) Weight:  [75 kg (165 lb 5.5 oz)] 75 kg (165 lb 5.5 oz) (12/28 0500) Last BM Date: 04/17/17 Mean arterial Pressure 70's  Intake/Output:   Intake/Output Summary (Last 24 hours) at 04/21/2017 0738 Last data filed at 04/21/2017 0700 Gross per 24 hour  Intake 6220.91 ml  Output 4048 ml  Net 2172.91 ml     Physical Exam: General:  Well appearing on vent HEENT: some ecchymosis around mouth and left eye from preop dental extractions.  Neck: supple. JVP . Carotids 2+ bilat; no bruits. No lymphadenopathy or thryomegaly appreciated. Cor: Mechanical heart sounds with LVAD hum present. Lungs: clear Abdomen: soft, nontender, nondistended. No hepatosplenomegaly. No bruits or masses. Good bowel sounds. Extremities: no cyanosis, clubbing, rash, edema Neuro: alert & orientedx3, cranial nerves grossly intact. moves all 4 extremities w/o difficulty. Affect pleasant  Telemetry: atrial paced 90  Labs: Basic Metabolic Panel: Recent Labs  Lab 04/19/17 1337 04/19/17 1928  04/20/17 1125 04/20/17 1228 04/20/17 1246 04/20/17 1403 04/20/17 1416 04/20/17 1935 04/20/17 1953 04/21/17 0259  NA 136 134*   < > 136 137  139 140 135  --  135 134*  K 3.2* 3.2*   < > 4.4 3.6 3.4* 3.5 3.5  --  5.1 4.5  CL 101 101   < > 96* 101  --   --  103  --  103 103  CO2 26 27  --   --   --   --   --  24  --   --  21*  GLUCOSE 104* 110*   < > 171* 150*  --  139* 143*  --  168* 188*  BUN 12 14   < > 10 11  --   --  10  --  11 13  CREATININE 1.41* 1.48*   < > 1.10 1.10  --   --  1.29* 1.60* 1.30* 1.60*  CALCIUM 8.6* 8.3*  --   --   --   --   --  8.2*  --   --  8.2*  MG  --   --   --   --   --   --   --  1.9 3.1*  --  2.3  PHOS  --   --   --   --   --   --   --   --   --   --  3.1   < > = values in this interval not displayed.    Liver Function Tests: Recent Labs  Lab 04/19/17 1928 04/21/17 0259  AST 20 95*  ALT 12* 19  ALKPHOS 100  61  BILITOT 1.1 4.0*  PROT 6.5 5.6*  ALBUMIN 2.3* 3.0*   No results for input(s): LIPASE, AMYLASE in the last 168 hours. No results for input(s): AMMONIA in the last 168 hours.  CBC: Recent Labs  Lab 04/19/17 1337  04/20/17 1042  04/20/17 1403 04/20/17 1416 04/20/17 1935 04/20/17 1953 04/21/17 0259  WBC 9.0  --   --   --   --  23.0* 20.3*  --  18.5*  NEUTROABS  --   --   --   --   --   --   --   --  15.5*  HGB 10.0*   < > 6.8*   < > 12.6* 11.4* 9.6* 9.5* 8.9*  HCT 31.4*   < > 21.4*   < > 37.0* 36.4* 30.5* 28.0* 27.6*  MCV 75.8*  --   --   --   --  78.3 78.4  --  77.3*  PLT 332  --  224  --   --  184  208 194  --  177   < > = values in this interval not displayed.    INR: Recent Labs  Lab 04/19/17 1337 04/20/17 1416 04/21/17 0259  INR 1.20 1.42  1.44 1.34    Other results:  EKG:   Imaging: Dg Chest 2 View  Result Date: 04/19/2017 CLINICAL DATA:  Heart failure with preserved ejection fracture. History of CHF, hypertension, coronary artery disease, MI, seizures. Ex-smoker. EXAM: CHEST  2 VIEW COMPARISON:  Chest x-ray dated 03/10/2017. FINDINGS: Cardiomegaly is stable. Left chest wall pacemaker/ICD apparatus appears stable in configuration. Lungs are clear. No  large pleural effusion or pneumothorax seen. IMPRESSION: No active cardiopulmonary disease. No evidence of pneumonia or pulmonary edema. Stable cardiomegaly Electronically Signed   By: Franki Cabot M.D.   On: 04/19/2017 20:39   Dg Chest Port 1 View  Result Date: 04/21/2017 CLINICAL DATA:  Left ventricular assist device, endotracheal tube. EXAM: PORTABLE CHEST 1 VIEW COMPARISON:  04/20/2017 and CT chest 03/13/2017. FINDINGS: Endotracheal tube terminates 4.5 cm above the carina. Right IJ Swan-Ganz catheter is coiled in the left pulmonary artery, as before. Pacemaker and ICD lead tips project over the right atrium and right ventricle. Left ventricular assist device is in place. Left chest tubes are stable in position. Trachea is midline. Heart is enlarged. Mild basilar predominant mixed interstitial and airspace opacification, similar to minimally improved from 04/20/2017. Left costophrenic angle is obscured by the LVAD. No definite right pleural fluid. IMPRESSION: 1. Stable support tubes/lines with the left IJ Swan-Ganz catheter tip in the left pulmonary artery. 2. Pulmonary edema, similar to minimally improved. 3. Mild bibasilar atelectasis. Electronically Signed   By: Lorin Picket M.D.   On: 04/21/2017 07:27   Dg Chest Port 1 View  Result Date: 04/20/2017 CLINICAL DATA:  Post LVAD insertion EXAM: PORTABLE CHEST 1 VIEW COMPARISON:  04/19/2017, 03/13/2017 FINDINGS: Interval intubation, tip of the endotracheal tube is 2.6 cm superior to the carina. Interval sternotomy. Left-sided pacing generator with leads projecting over right atrium and right ventricle. Disconnected epicardial leads on the left side. Probable coronary stent. Right IJ Swan-Ganz catheter with distal portion of the catheter folded back upon itself and the tip directed cephalad over expected region of left pulmonary artery. Placement of chest drainage catheters over the left chest and mediastinum. No definite pneumothorax. Placement of  LVAD. Cardiomegaly with mild pulmonary edema. Small pleural effusions and bibasilar atelectasis or consolidation. IMPRESSION: 1. Placement of support lines and tubes  as above. Swan-Ganz catheter appears slightly kinked/folded back upon itself distally with the tip directed cephalad and slightly to the patient's left 2. Interval sternotomy changes with placement of LVAD device 3. Cardiomegaly with mild pulmonary edema and development of small pleural effusions and bibasilar consolidations. Electronically Signed   By: Donavan Foil M.D.   On: 04/20/2017 14:57      Medications:     Scheduled Medications: . acetaminophen  1,000 mg Oral Q6H   Or  . acetaminophen (TYLENOL) oral liquid 160 mg/5 mL  1,000 mg Per Tube Q6H  . amiodarone  200 mg Oral BID  . aspirin EC  325 mg Oral Daily   Or  . aspirin  324 mg Per Tube Daily   Or  . aspirin  300 mg Rectal Daily  . atorvastatin  40 mg Oral q1800  . bisacodyl  10 mg Oral Daily   Or  . bisacodyl  10 mg Rectal Daily  . chlorhexidine gluconate (MEDLINE KIT)  15 mL Mouth Rinse BID  . Chlorhexidine Gluconate Cloth  6 each Topical Daily  . docusate sodium  200 mg Oral Daily  . insulin aspart  0-24 Units Subcutaneous Q4H  . levalbuterol  1.25 mg Nebulization Q6H  . mouth rinse  15 mL Mouth Rinse QID  . mupirocin ointment  1 application Nasal BID  . [START ON 04/22/2017] pantoprazole  40 mg Oral Daily  . rifampin  600 mg Oral Once  . sodium chloride flush  10-40 mL Intracatheter Q12H  . sodium chloride flush  3 mL Intravenous Q12H     Infusions: . sodium chloride 20 mL/hr at 04/20/17 1500  . sodium chloride    . sodium chloride 20 mL/hr at 04/20/17 1436  . albumin human    . cefUROXime (ZINACEF)  IV Stopped (04/20/17 2237)  . dexmedetomidine (PRECEDEX) IV infusion 0.7 mcg/kg/hr (04/21/17 0325)  . EPINEPHrine 4 mg in dextrose 5% 250 mL infusion (16 mcg/mL) 4 mcg/min (04/21/17 0552)  . lactated ringers    . lactated ringers    . lactated  ringers 20 mL/hr at 04/20/17 1435  . milrinone 0.25 mcg/kg/min (04/21/17 0210)  . norepinephrine (LEVOPHED) Adult infusion 5 mcg/min (04/21/17 0733)  . vancomycin Stopped (04/20/17 2108)     PRN Medications:  sodium chloride, albumin human, lactated ringers, linaclotide, midazolam, morphine injection, ondansetron (ZOFRAN) IV, oxyCODONE, sodium chloride flush, sodium chloride flush, traMADol   Assessment:   1. Ischemic cardiomyopathy s/p multiple PCI's with chronic systolic heart failure and EF 20% on home dobutamine preop with significant improvement in symptoms. Evaluated at [facility] for heart transplant but LVAD felt to be the best treatment for him at this time. POD 1 s/p HeartaMate III LVAD. 2. Hypertension 3. Hyperlipidemia 4. 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 5. Stage III CKD 6. LBBB s/p CRT-D upgrade in 10/2016 without improvement in symptoms or EF. LV epicardial leads cut off at the time of surgery. 7. Prior smoking with mild obstruction and severe diffusion defect on PFT's. Will need attention to pulmonary toilet.   Plan/Discussion:    He has been hemodynamically stable postop with stable VAD parameters. PI has been on the low side at 1.8 but only two PI events so I would leave things as is, hold off on diuresis and try to get him extubated to nasal NO.  Keep chest tubes in today.  Start Coumadin tonight.  I reviewed the LVAD parameters from today, and compared the results to the patient's prior recorded  data.  No programming changes were made.  The LVAD is functioning within specified parameters.   LVAD interrogation was negative for any significant power changes, alarms or PI events/speed drops.  LVAD equipment check completed and is in good working order.  Back-up equipment present.   LVAD education done on emergency procedures and precautions and reviewed exit site care.  Length of Stay: 2  Gaye Pollack 04/21/2017, 7:38 AM

## 2017-04-21 NOTE — Progress Notes (Signed)
Patient ID: David [name], male   DOB: 10-04-1965, 50 y.o.   MRN: [id number]  TCTS Evening Rounds:   Hypertensive today with MAP 90-100 off Levophed and epi. Giving hydralazine prn.  CI = 2.89 VAD parameters stable. PI up since extubation.  Complains of not being able to catch his breath and has had a lot of pain today. Has received a lot of morphine. RR in the 40's. ABG this pm ok. PCO2 is 28 and PO2 63 with sat 92% on 5L.  Urine output 30-40/hr. Will give him a dose of lasix this pm since PAP and CVP up and PI up.  CT output low  CBC    Component Value Date/Time   WBC 18.5 (H) 04/21/2017 0259   RBC 3.57 (L) 04/21/2017 0259   HGB 9.5 (L) 04/21/2017 1634   HCT 28.0 (L) 04/21/2017 1634   PLT 177 04/21/2017 0259   MCV 77.3 (L) 04/21/2017 0259   MCH 24.9 (L) 04/21/2017 0259   MCHC 32.2 04/21/2017 0259   RDW 17.3 (H) 04/21/2017 0259   LYMPHSABS 1.5 04/21/2017 0259   MONOABS 1.5 (H) 04/21/2017 0259   EOSABS 0.0 04/21/2017 0259   BASOSABS 0.0 04/21/2017 0259     BMET    Component Value Date/Time   NA 134 (L) 04/21/2017 1634   NA 141 05/24/2016 1651   K 4.6 04/21/2017 1634   CL 100 (L) 04/21/2017 1634   CO2 21 (L) 04/21/2017 0259   GLUCOSE 104 (H) 04/21/2017 1634   BUN 14 04/21/2017 1634   BUN 21 05/24/2016 1651   CREATININE 1.40 (H) 04/21/2017 1634   CALCIUM 8.2 (L) 04/21/2017 0259   GFRNONAA 48 (L) 04/21/2017 0259   GFRAA 55 (L) 04/21/2017 0259     A/P:  Overall doing well. Will give lasix tonight.

## 2017-04-21 NOTE — Progress Notes (Addendum)
COOX sent and results lower than expected at 44%. RN redrew sample and sent. Lab results 51%.  MD notified, okay with results.

## 2017-04-21 NOTE — Care Management Note (Signed)
Case Management Note Marvetta Gibbons RN, BSN Unit 4E-Case Manager-- [place] coverage [phone]  Patient Details  Name: David [name] MRN: [id number] Date of Birth: 02-Mar-1966  Subjective/Objective:    Pt admitted s/p VAD- Heartmate 3 implanted on 04/20/17                Action/Plan: PTA Pt lived at home with spouse- was active with [facility] for home Dobutamine-HH services- PCP- Gilford Rile-  CM will follow post VAD implant for transition needs- anticipate return home with wife.   Expected Discharge Date:                  Expected Discharge Plan:  [place]  In-House Referral:     Discharge planning Services  CM Consult  Post Acute Care Choice:  Home Health, Resumption of Svcs/PTA Provider Choice offered to:  Patient  DME Arranged:    DME Agency:     HH Arranged:  RN [place] Agency:  [place]  Status of Service:  In process, will continue to follow  If discussed at Long Length of Stay Meetings, dates discussed:    Discharge Disposition:   Additional Comments:  Dawayne Patricia, RN 04/21/2017, 10:10 AM

## 2017-04-22 ENCOUNTER — Inpatient Hospital Stay (HOSPITAL_COMMUNITY): Payer: Medicare HMO

## 2017-04-22 LAB — CBC WITH DIFFERENTIAL/PLATELET
BASOS ABS: 0 10*3/uL (ref 0.0–0.1)
Basophils Relative: 0 %
EOS PCT: 0 %
Eosinophils Absolute: 0 10*3/uL (ref 0.0–0.7)
HEMATOCRIT: 25.9 % — AB (ref 39.0–52.0)
HEMOGLOBIN: 8.4 g/dL — AB (ref 13.0–17.0)
LYMPHS ABS: 1.1 10*3/uL (ref 0.7–4.0)
LYMPHS PCT: 3 %
MCH: 24.9 pg — AB (ref 26.0–34.0)
MCHC: 32.4 g/dL (ref 30.0–36.0)
MCV: 76.9 fL — ABNORMAL LOW (ref 78.0–100.0)
MONOS PCT: 8 %
Monocytes Absolute: 3 10*3/uL — ABNORMAL HIGH (ref 0.1–1.0)
NEUTROS ABS: 32.8 10*3/uL — AB (ref 1.7–7.7)
Neutrophils Relative %: 89 %
Platelets: 156 10*3/uL (ref 150–400)
RBC: 3.37 MIL/uL — ABNORMAL LOW (ref 4.22–5.81)
RDW: 17.9 % — ABNORMAL HIGH (ref 11.5–15.5)
WBC MORPHOLOGY: INCREASED
WBC: 36.9 10*3/uL — ABNORMAL HIGH (ref 4.0–10.5)

## 2017-04-22 LAB — COMPREHENSIVE METABOLIC PANEL
ALBUMIN: 2.5 g/dL — AB (ref 3.5–5.0)
ALT: 77 U/L — ABNORMAL HIGH (ref 17–63)
ANION GAP: 11 (ref 5–15)
AST: 171 U/L — AB (ref 15–41)
Alkaline Phosphatase: 75 U/L (ref 38–126)
BILIRUBIN TOTAL: 7.4 mg/dL — AB (ref 0.3–1.2)
BUN: 21 mg/dL — AB (ref 6–20)
CHLORIDE: 99 mmol/L — AB (ref 101–111)
CO2: 19 mmol/L — AB (ref 22–32)
Calcium: 8.3 mg/dL — ABNORMAL LOW (ref 8.9–10.3)
Creatinine, Ser: 2.35 mg/dL — ABNORMAL HIGH (ref 0.61–1.24)
GFR calc Af Amer: 35 mL/min — ABNORMAL LOW (ref >60)
GFR calc non Af Amer: 30 mL/min — ABNORMAL LOW (ref >60)
GLUCOSE: 109 mg/dL — AB (ref 65–99)
POTASSIUM: 4.6 mmol/L (ref 3.5–5.1)
SODIUM: 129 mmol/L — AB (ref 135–145)
TOTAL PROTEIN: 5.8 g/dL — AB (ref 6.5–8.1)

## 2017-04-22 LAB — POCT I-STAT 3, ART BLOOD GAS (G3+)
Acid-base deficit: 4 mmol/L — ABNORMAL HIGH (ref 0.0–2.0)
Bicarbonate: 19.5 mmol/L — ABNORMAL LOW (ref 20.0–28.0)
O2 Saturation: 93 %
PCO2 ART: 31.7 mmHg — AB (ref 32.0–48.0)
PH ART: 7.4 (ref 7.350–7.450)
TCO2: 20 mmol/L — AB (ref 22–32)
pO2, Arterial: 69 mmHg — ABNORMAL LOW (ref 83.0–108.0)

## 2017-04-22 LAB — COOXEMETRY PANEL
CARBOXYHEMOGLOBIN: 1.6 % — AB (ref 0.5–1.5)
Carboxyhemoglobin: 1.6 % — ABNORMAL HIGH (ref 0.5–1.5)
METHEMOGLOBIN: 0.8 % (ref 0.0–1.5)
Methemoglobin: 1.3 % (ref 0.0–1.5)
O2 Saturation: 42.4 %
O2 Saturation: 44.2 %
TOTAL HEMOGLOBIN: 8.1 g/dL — AB (ref 12.0–16.0)
TOTAL HEMOGLOBIN: 14 g/dL (ref 12.0–16.0)

## 2017-04-22 LAB — POCT I-STAT 4, (NA,K, GLUC, HGB,HCT)
GLUCOSE: 137 mg/dL — AB (ref 65–99)
HEMATOCRIT: 30 % — AB (ref 39.0–52.0)
HEMOGLOBIN: 10.2 g/dL — AB (ref 13.0–17.0)
POTASSIUM: 4 mmol/L (ref 3.5–5.1)
Sodium: 132 mmol/L — ABNORMAL LOW (ref 135–145)

## 2017-04-22 LAB — GLUCOSE, CAPILLARY
GLUCOSE-CAPILLARY: 121 mg/dL — AB (ref 65–99)
GLUCOSE-CAPILLARY: 141 mg/dL — AB (ref 65–99)
Glucose-Capillary: 126 mg/dL — ABNORMAL HIGH (ref 65–99)
Glucose-Capillary: 137 mg/dL — ABNORMAL HIGH (ref 65–99)

## 2017-04-22 LAB — MAGNESIUM: MAGNESIUM: 1.8 mg/dL (ref 1.7–2.4)

## 2017-04-22 LAB — PROTIME-INR
INR: 1.52
PROTHROMBIN TIME: 18.2 s — AB (ref 11.4–15.2)

## 2017-04-22 LAB — PHOSPHORUS: PHOSPHORUS: 4.4 mg/dL (ref 2.5–4.6)

## 2017-04-22 LAB — LACTATE DEHYDROGENASE: LDH: 576 U/L — ABNORMAL HIGH (ref 98–192)

## 2017-04-22 LAB — PREPARE RBC (CROSSMATCH)

## 2017-04-22 MED ORDER — FUROSEMIDE 10 MG/ML IJ SOLN
8.0000 mg/h | INTRAVENOUS | Status: DC
  Administered 2017-04-22 – 2017-04-23 (×2): 8 mg/h via INTRAVENOUS
  Filled 2017-04-22 (×3): qty 25

## 2017-04-22 MED ORDER — NOREPINEPHRINE BITARTRATE 1 MG/ML IV SOLN
0.0000 ug/min | INTRAVENOUS | Status: DC
  Administered 2017-04-22: 5 ug/min via INTRAVENOUS
  Administered 2017-04-23: 10 ug/min via INTRAVENOUS
  Filled 2017-04-22 (×2): qty 4

## 2017-04-22 MED ORDER — SODIUM CHLORIDE 0.9 % IV SOLN
Freq: Once | INTRAVENOUS | Status: AC
  Administered 2017-04-22: 07:00:00 via INTRAVENOUS

## 2017-04-22 MED ORDER — METOLAZONE 5 MG PO TABS
5.0000 mg | ORAL_TABLET | Freq: Once | ORAL | Status: AC
  Administered 2017-04-22: 5 mg via ORAL
  Filled 2017-04-22: qty 1

## 2017-04-22 MED ORDER — TRAMADOL HCL 50 MG PO TABS: 50.0000 mg | ORAL_TABLET | Freq: Two times a day (BID) | ORAL | Status: DC | PRN

## 2017-04-22 MED ORDER — FUROSEMIDE 10 MG/ML IJ SOLN: 40.0000 mg | Freq: Two times a day (BID) | INTRAMUSCULAR | Status: DC

## 2017-04-22 MED ORDER — WARFARIN SODIUM 1 MG PO TABS
1.0000 mg | ORAL_TABLET | Freq: Every day | ORAL | Status: DC
  Administered 2017-04-22: 1 mg via ORAL
  Filled 2017-04-22 (×2): qty 1

## 2017-04-22 MED ORDER — ORAL CARE MOUTH RINSE
15.0000 mL | Freq: Two times a day (BID) | OROMUCOSAL | Status: DC
  Administered 2017-04-22 – 2017-04-23 (×3): 15 mL via OROMUCOSAL

## 2017-04-22 MED ORDER — FUROSEMIDE 10 MG/ML IJ SOLN
40.0000 mg | Freq: Once | INTRAMUSCULAR | Status: AC
  Administered 2017-04-22: 40 mg via INTRAVENOUS
  Filled 2017-04-22: qty 4

## 2017-04-22 MED ORDER — MAGNESIUM SULFATE 2 GM/50ML IV SOLN
2.0000 g | Freq: Once | INTRAVENOUS | Status: AC
  Administered 2017-04-22: 2 g via INTRAVENOUS
  Filled 2017-04-22: qty 50

## 2017-04-22 NOTE — Evaluation (Signed)
Physical Therapy Evaluation Patient Details Name: David [name] MRN: [id number] DOB: 1965/05/06 Today's Date: 04/22/2017   History of Present Illness  This 51 y.o. male admitted for LVAD.  PMH includes:  Acute on chronic systolic HF, CAD, PAF, CKD stage III, LBBB, frequent PVCs, seizures, PNS, MI, ischemic cardiomyopathy, s/p AICD   Clinical Impression  Orders received for PT evaluation. Patient demonstrates deficits in functional mobility as indicated below. Will benefit from continued skilled PT to address deficits and maximize function. Will see as indicated and progress as tolerated.  Session focused on OOB to chair activities, min A +2 to pivot to recliner. VSS throughout.    Follow Up Recommendations Home health PT;Supervision/Assistance - 24 hour    Equipment Recommendations  (TBD)    Recommendations for Other Services       Precautions / Restrictions Precautions Precautions: Fall;Sternal;Other (comment)(LVAD ) Precaution Comments: Pt instructed in sternal precautions, and began instruction on drive line safety       Mobility  Bed Mobility Overal bed mobility: Needs Assistance Bed Mobility: Supine to Sit     Supine to sit: Mod assist     General bed mobility comments: assist to lift trunk from bed and to scoot to EOB   Transfers Overall transfer level: Needs assistance   Transfers: Sit to/from Stand;Stand Pivot Transfers Sit to Stand: Min assist;+2 physical assistance;+2 safety/equipment Stand pivot transfers: Min assist;+2 physical assistance;+2 safety/equipment       General transfer comment: assist to move into standing and assist to steady   Ambulation/Gait                Stairs            Wheelchair Mobility    Modified Rankin (Stroke Patients Only)       Balance Overall balance assessment: Needs assistance Sitting-balance support: Feet supported Sitting balance-Leahy Scale: Fair     Standing balance support: Bilateral upper  extremity supported Standing balance-Leahy Scale: Poor Standing balance comment: requires min A                             Pertinent Vitals/Pain Pain Assessment: Faces Faces Pain Scale: Hurts even more Pain Location: chest/incisional  Pain Descriptors / Indicators: Operative site guarding;Grimacing;Guarding Pain Intervention(s): Monitored during session;Repositioned    Home Living Family/patient expects to be discharged to:: Private residence Living Arrangements: Spouse/significant other Available Help at Discharge: Family;Available 24 hours/day Type of Home: Apartment Home Access: Level entry     Home Layout: One level Home Equipment: None      Prior Function Level of Independence: Independent         Comments: Pt reports he does not drive, but does occasionally assist with grocery shopping and was able to walk through grocery store ~3 weeks PTA      Hand Dominance   Dominant Hand: Right    Extremity/Trunk Assessment   Upper Extremity Assessment Upper Extremity Assessment: Generalized weakness    Lower Extremity Assessment Lower Extremity Assessment: Defer to PT evaluation    Cervical / Trunk Assessment Cervical / Trunk Assessment: Normal  Communication   Communication: No difficulties  Cognition Arousal/Alertness: Awake/alert Behavior During Therapy: Flat affect Overall Cognitive Status: Within Functional Limits for tasks assessed                                 General Comments: [facility] for basic info  General Comments General comments (skin integrity, edema, etc.): VSS     Exercises     Assessment/Plan    PT Assessment Patient needs continued PT services  PT Problem List Decreased strength;Decreased activity tolerance;Decreased balance;Decreased mobility;Decreased coordination;Decreased knowledge of use of DME;Cardiopulmonary status limiting activity;Pain       PT Treatment Interventions DME instruction;Gait  training;Stair training;Functional mobility training;Therapeutic activities;Therapeutic exercise;Balance training;Patient/family education    PT Goals (Current goals can be found in the Care Plan section)  Acute Rehab PT Goals Patient Stated Goal: To get stronger and go home  PT Goal Formulation: With patient Time For Goal Achievement: 05/06/17 Potential to Achieve Goals: Good    Frequency Min 3X/week   Barriers to discharge        Co-evaluation PT/OT/SLP Co-Evaluation/Treatment: Yes Reason for Co-Treatment: Complexity of the patient's impairments (multi-system involvement);For patient/therapist safety PT goals addressed during session: Mobility/safety with mobility OT goals addressed during session: Strengthening/ROM       AM-PAC PT "6 Clicks" Daily Activity  Outcome Measure Difficulty turning over in bed (including adjusting bedclothes, sheets and blankets)?: Unable Difficulty moving from lying on back to sitting on the side of the bed? : Unable Difficulty sitting down on and standing up from a chair with arms (e.g., wheelchair, bedside commode, etc,.)?: Unable Help needed moving to and from a bed to chair (including a wheelchair)?: A Little Help needed walking in hospital room?: A Little Help needed climbing 3-5 steps with a railing? : A Lot 6 Click Score: 11    End of Session Equipment Utilized During Treatment: Oxygen Activity Tolerance: Patient tolerated treatment well Patient left: in chair;with call bell/phone within reach;with nursing/sitter in room Nurse Communication: Mobility status PT Visit Diagnosis: Unsteadiness on feet (R26.81);Muscle weakness (generalized) (M62.81);Difficulty in walking, not elsewhere classified (R26.2)    Time: 5188-4166 PT Time Calculation (min) (ACUTE ONLY): 24 min   Charges:   PT Evaluation $PT Eval High Complexity: 1 High     PT G Codes:        Alben Deeds, PT DPT  Board Certified Neurologic  Specialist [phone]   Duncan Dull 04/22/2017, 11:05 AM

## 2017-04-22 NOTE — Progress Notes (Signed)
Patient ID: David [name], male   DOB: [birth year], 51 y.o.   MRN: [id number]   Advanced Heart Failure VAD Team Note  Subjective:    David [name] is a 51 y.o. male with a PMH of ICM ('09 PCI to LAD, mRCA, '10 PCI to Lcx and '12 mRCA), HFrEF (EF ~20% for >5 years), HLD and HTN admitted 12/26 for VAD placement due to end-stage systolic HF.   Underwent successful HM-3 LVAD placement 04/20/17.  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   Extubated 12/28.  Epinephrine stopped but then UOP and co-ox dropped so restarted last night at 4.  This morning, he is on NO 3 ppm, milrinone 0.25, epinephrine 4.  MAP 80s currently.   He was febrile during the day yesterday and up to 100.8 last night, now afebrile.  WBCs up to 36.9.  CXR yesterday with pulmonary edema.    Today, main complaint is surgical site pain, hard to take a deep breath but better than yesterday.    Luiz Blare numbers reviewed personally.  CVP 16 PA 46/18 CI 2.4 Co-ox 42% early am  LVAD INTERROGATION:  HeartMate III LVAD:  Flow 4.4 liters/min, speed 5300, power 3.9, PI 4.2. Few PI events yesterday am.  Objective:    Vital Signs:   Temp:  [99.5 F (37.5 C)-101.5 F (38.6 C)] 99.7 F (37.6 C) (12/29 0650) Pulse Rate:  [49-104] 99 (12/29 0615) Resp:  [10-46] 32 (12/29 0700) BP: (80-145)/(40-113) 93/80 (12/29 0700) SpO2:  [62 %-100 %] 62 % (12/29 0615) Arterial Line BP: (77-113)/(59-105) 82/69 (12/29 0700) FiO2 (%):  [40 %] 40 % (12/28 0800) Weight:  [165 lb 5.5 oz (75 kg)-169 lb 8.5 oz (76.9 kg)] 169 lb 8.5 oz (76.9 kg) (12/29 0500) Last BM Date: 04/17/17 Mean arterial Pressure 80s  Intake/Output:   Intake/Output Summary (Last 24 hours) at 04/22/2017 0752 Last data filed at 04/22/2017 0700 Gross per 24 hour  Intake 2831.07 ml  Output 1330 ml  Net 1501.07 ml     Physical Exam    GENERAL: Well appearing this am. NAD.  HEENT: Normal. NECK: Supple, JVP 12+ cm. Right IJ Swan.  CARDIAC:  Mechanical heart  sounds with LVAD hum present.  LUNGS:  Crackles dependently.   ABDOMEN:  NT, ND, no HSM. No bruits or masses. +BS  LVAD exit site: Well-healed and incorporated. Dressing dry and intact. No erythema or drainage. Stabilization device present and accurately applied. Driveline dressing changed daily per sterile technique. EXTREMITIES:  Warm and dry. No cyanosis, clubbing, rash, or edema.  NEUROLOGIC:  Alert & oriented x 3. Cranial nerves grossly intact. Moves all 4 extremities w/o difficulty. Affect pleasant    Telemetry   NSR 90s (personally reviewed)  Labs   Basic Metabolic Panel: Recent Labs  Lab 04/19/17 1337 04/19/17 1928  04/20/17 1416 04/20/17 1935 04/20/17 1953 04/21/17 0259 04/21/17 1622 04/21/17 1634 04/22/17 0356  NA 136 134*   < > 135  --  135 134*  --  134* 129*  K 3.2* 3.2*   < > 3.5  --  5.1 4.5  --  4.6 4.6  CL 101 101   < > 103  --  103 103  --  100* 99*  CO2 26 27  --  24  --   --  21*  --   --  19*  GLUCOSE 104* 110*   < > 143*  --  168* 188*  --  104* 109*  BUN 12 14   < >  10  --  11 13  --  14 21*  CREATININE 1.41* 1.48*   < > 1.29* 1.60* 1.30* 1.60* 1.54* 1.40* 2.35*  CALCIUM 8.6* 8.3*  --  8.2*  --   --  8.2*  --   --  8.3*  MG  --   --   --  1.9 3.1*  --  2.3 1.9  --  1.8  PHOS  --   --   --   --   --   --  3.1  --   --  4.4   < > = values in this interval not displayed.    Liver Function Tests: Recent Labs  Lab 04/19/17 1928 04/21/17 0259 04/22/17 0356  AST 20 95* 171*  ALT 12* 19 77*  ALKPHOS 100 61 75  BILITOT 1.1 4.0* 7.4*  PROT 6.5 5.6* 5.8*  ALBUMIN 2.3* 3.0* 2.5*   No results for input(s): LIPASE, AMYLASE in the last 168 hours. No results for input(s): AMMONIA in the last 168 hours.  CBC: Recent Labs  Lab 04/20/17 1416 04/20/17 1935 04/20/17 1953 04/21/17 0259 04/21/17 1622 04/21/17 1634 04/22/17 0356  WBC 23.0* 20.3*  --  18.5* 28.9*  --  36.9*  NEUTROABS  --   --   --  15.5*  --   --  32.8*  HGB 11.4* 9.6* 9.5* 8.9* 8.6*  9.5* 8.4*  HCT 36.4* 30.5* 28.0* 27.6* 26.8* 28.0* 25.9*  MCV 78.3 78.4  --  77.3* 77.2*  --  76.9*  PLT 184  208 194  --  177 154  --  156    INR: Recent Labs  Lab 04/19/17 1337 04/20/17 1416 04/21/17 0259 04/22/17 0356  INR 1.20 1.42  1.44 1.34 1.52    Other results:  Imaging   Dg Chest Port 1 View  Result Date: 04/21/2017 CLINICAL DATA:  Left ventricular assist device, endotracheal tube. EXAM: PORTABLE CHEST 1 VIEW COMPARISON:  04/20/2017 and CT chest 03/13/2017. FINDINGS: Endotracheal tube terminates 4.5 cm above the carina. Right IJ Swan-Ganz catheter is coiled in the left pulmonary artery, as before. Pacemaker and ICD lead tips project over the right atrium and right ventricle. Left ventricular assist device is in place. Left chest tubes are stable in position. Trachea is midline. Heart is enlarged. Mild basilar predominant mixed interstitial and airspace opacification, similar to minimally improved from 04/20/2017. Left costophrenic angle is obscured by the LVAD. No definite right pleural fluid. IMPRESSION: 1. Stable support tubes/lines with the left IJ Swan-Ganz catheter tip in the left pulmonary artery. 2. Pulmonary edema, similar to minimally improved. 3. Mild bibasilar atelectasis. Electronically Signed   By: Lorin Picket M.D.   On: 04/21/2017 07:27   Dg Chest Port 1 View  Result Date: 04/20/2017 CLINICAL DATA:  Post LVAD insertion EXAM: PORTABLE CHEST 1 VIEW COMPARISON:  04/19/2017, 03/13/2017 FINDINGS: Interval intubation, tip of the endotracheal tube is 2.6 cm superior to the carina. Interval sternotomy. Left-sided pacing generator with leads projecting over right atrium and right ventricle. Disconnected epicardial leads on the left side. Probable coronary stent. Right IJ Swan-Ganz catheter with distal portion of the catheter folded back upon itself and the tip directed cephalad over expected region of left pulmonary artery. Placement of chest drainage catheters over  the left chest and mediastinum. No definite pneumothorax. Placement of LVAD. Cardiomegaly with mild pulmonary edema. Small pleural effusions and bibasilar atelectasis or consolidation. IMPRESSION: 1. Placement of support lines and tubes as above. Swan-Ganz catheter appears slightly  kinked/folded back upon itself distally with the tip directed cephalad and slightly to the patient's left 2. Interval sternotomy changes with placement of LVAD device 3. Cardiomegaly with mild pulmonary edema and development of small pleural effusions and bibasilar consolidations. Electronically Signed   By: Donavan Foil M.D.   On: 04/20/2017 14:57     Medications:     Scheduled Medications: . acetaminophen  1,000 mg Oral Q6H  . amiodarone  200 mg Oral BID  . aspirin EC  325 mg Oral Daily  . atorvastatin  40 mg Oral q1800  . bisacodyl  10 mg Oral Daily  . Chlorhexidine Gluconate Cloth  6 each Topical Daily  . docusate sodium  200 mg Oral Daily  . furosemide  40 mg Intravenous BID  . insulin aspart  0-24 Units Subcutaneous Q4H  . levalbuterol  1.25 mg Nebulization Q6H  . pantoprazole  40 mg Oral Daily  . sodium chloride flush  10-40 mL Intracatheter Q12H  . sodium chloride flush  3 mL Intravenous Q12H  . warfarin  2.5 mg Oral q1800  . Warfarin - Physician Dosing Inpatient   Does not apply q1800    Infusions: . sodium chloride 20 mL/hr at 04/21/17 0800  . sodium chloride 20 mL/hr at 04/21/17 0800  . cefUROXime (ZINACEF)  IV Stopped (04/21/17 2234)  . dexmedetomidine (PRECEDEX) IV infusion Stopped (04/21/17 0853)  . EPINEPHrine 4 mg in dextrose 5% 250 mL infusion (16 mcg/mL) 4 mcg/min (04/22/17 7741)  . lactated ringers Stopped (04/21/17 1932)  . lactated ringers 20 mL/hr at 04/21/17 0800  . magnesium sulfate 1 - 4 g bolus IVPB    . milrinone 0.25 mcg/kg/min (04/21/17 1929)  . norepinephrine (LEVOPHED) Adult infusion Stopped (04/21/17 1247)    PRN Medications: sodium chloride, hydrALAZINE, linaclotide,  midazolam, morphine injection, ondansetron (ZOFRAN) IV, oxyCODONE, sodium chloride flush, sodium chloride flush, traMADol   Patient Profile   Suhail Peloquin is a 51 y.o. male with a PMH of ICM ('09 PCI to LAD, mRCA, '10 PCI to Lcx and '12 mRCA), HFrEF (EF ~20% for >5 years) s/p HM-III VAD placement on 12/27 due to end-stage systolic HF.   Assessment/Plan:    1. Acute on chronic systolic HF: Echo 28/78/67 with LVEF 20-25%, Mild MR, Mild/Mod MR, Severe LAE, Mildly reduced RV, PA peak pressure 56 mm Hg.  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 Extubated, initially stopped epinephrine but restarted last night with drop in UOP.  This morning, on NO 3, milrinone 0.25, and epinephrine 4.  Co-ox down to 42% overnight (oxygen saturation also lower), CI this morning 2.4 back on epinephrine.  RV looked ok in OR per Dr. Cyndia Bent.  He is volume overloaded with CVP 16 and pulmonary edema but creatinine up to 2.3.  - Continue current milrinone and epinephrine, resend co-ox.  - Continue Swan today.  - Now that MAP stabilized, will give Lasix 40 mg IV and start gtt at 8 mg/hr.   - Continue ASA 325 until INR > 2.  - Warfarin goal INR 2-2.5.  - Discussed with Dr Cyndia Bent, increase pump speed to 5400.  2. CAD s/p PCI to mid RCA and PLOM with DES x 2 and DES to mid LAD in 1/18.   - Continue ASA and statin  3. PAF: Maintaining NSR post-op. - Continue po amio.   4. AKI on CKD stage III: Creatinine up to 2.3 today probably in setting of lower MAP yesterday, now back on epinephrine  and MAP in 80s.   - Aim for MAP in 80s.   - Volume overloaded so will diurese as above with Lasix gtt.  5. ABLA, expected: Got 1 unit PRBCs overnight, hgb 8.4.  6. ID: WBCs up to 36.9, not febrile this morning but up to 100.8 last night. Discussed with Dr. Cyndia Bent, think inflammatory most likely.  - No abx for now but will send cultures.  - Repeat CXR this morning.   CRITICAL CARE Performed by: Loralie Champagne  Total critical care time: 40 minutes  Critical care time was exclusive of separately billable procedures and treating other patients.  Critical care was necessary to treat or prevent imminent or life-threatening deterioration.  Critical care was time spent personally by me on the following activities: development of treatment plan with patient and/or surrogate as well as nursing, discussions with consultants, evaluation of patient's response to treatment, examination of patient, obtaining history from patient or surrogate, ordering and performing treatments and interventions, ordering and review of laboratory studies, ordering and review of radiographic studies, pulse oximetry and re-evaluation of patient's condition.  Length of Stay: 3  Loralie Champagne, MD 04/22/2017, 7:52 AM  VAD Team --- VAD ISSUES ONLY--- Pager [phone] (7am - 7am)  Advanced Heart Failure Team  Pager [phone] (M-F; 7a - 4p)  Please contact [facility] for night-coverage after hours (4p -7a ) and weekends on [email or web address]

## 2017-04-22 NOTE — Plan of Care (Signed)
  Progressing Education: Knowledge of General Education information will improve 04/22/2017 1745 - Progressing by Etta Quill, RN Activity: Risk for activity intolerance will decrease 04/22/2017 1745 - Progressing by Etta Quill, RN Coping: Level of anxiety will decrease 04/22/2017 1745 - Progressing by Etta Quill, RN Education: Ability to demonstrate proper wound care will improve 04/22/2017 1745 - Progressing by Etta Quill, RN Knowledge of disease or condition will improve 04/22/2017 1745 - Progressing by Etta Quill, RN Knowledge of the prescribed therapeutic regimen will improve 04/22/2017 1745 - Progressing by Etta Quill, RN Activity: Risk for activity intolerance will decrease 04/22/2017 1745 - Progressing by Etta Quill, RN Cardiac: Hemodynamic stability will improve 04/22/2017 1745 - Progressing by Etta Quill, RN

## 2017-04-22 NOTE — Progress Notes (Signed)
Patient ID: David [name], male   DOB: [birth year], 51 y.o.   MRN: [id number] [facility] Evening Rounds:  Added some Levophed today for MAP in the 60's after getting up to chair.  Now on milrinone 0.375, epi 4, levophed 8 with CI 3.1 with MAP 80-90. CVP 13-15 today but could not diurese much due to AKI so not surprising. He is on lasix drip at 8 with UO 30-45/hr.  VAD flow 4.7 with PI 3.3.  CBC    Component Value Date/Time   WBC 36.9 (H) 04/22/2017 0356   RBC 3.37 (L) 04/22/2017 0356   HGB 10.2 (L) 04/22/2017 1701   HCT 30.0 (L) 04/22/2017 1701   PLT 156 04/22/2017 0356   MCV 76.9 (L) 04/22/2017 0356   MCH 24.9 (L) 04/22/2017 0356   MCHC 32.4 04/22/2017 0356   RDW 17.9 (H) 04/22/2017 0356   LYMPHSABS 1.1 04/22/2017 0356   MONOABS 3.0 (H) 04/22/2017 0356   EOSABS 0.0 04/22/2017 0356   BASOSABS 0.0 04/22/2017 0356   BMET    Component Value Date/Time   NA 132 (L) 04/22/2017 1701   NA 141 05/24/2016 1651   K 4.0 04/22/2017 1701   CL 99 (L) 04/22/2017 0356   CO2 19 (L) 04/22/2017 0356   GLUCOSE 137 (H) 04/22/2017 1701   BUN 21 (H) 04/22/2017 0356   BUN 21 05/24/2016 1651   CREATININE 2.35 (H) 04/22/2017 0356   CALCIUM 8.3 (L) 04/22/2017 0356   GFRNONAA 30 (L) 04/22/2017 0356   GFRAA 35 (L) 04/22/2017 0356   Discussed status and plans with daughter at bedside.

## 2017-04-22 NOTE — Progress Notes (Signed)
Patient ID: David [name], male   DOB: Jun 19, 1965, 51 y.o.   MRN: [id number] HeartMate 3 Rounding Note  Subjective:    Feels a little better today but still has significant incisional pain.  Urine output yesterday was ok but did not have much response to 80 mg lasix and output overnight was 30/hr, became oliguric this am. Creat jumped up to 2.35 from 1.4 yesterday afternoon. Co-ox down to 42 this am on milrinone 0.25. Epi added back at 4 mcg. Pump parameters have been stable and PI has been around 4 so speed increased to 5400.  LVAD INTERROGATION:  HeartMate II LVAD:  Flow 4.6 liters/min, speed 5400, power 3.9, PI 3.8.    Objective:    Vital Signs:   Temp:  [99.1 F (37.3 C)-101.5 F (38.6 C)] 99.1 F (37.3 C) (12/29 0822) Pulse Rate:  [49-112] 112 (12/29 0822) Resp:  [10-46] 26 (12/29 0822) BP: (80-145)/(40-113) 93/80 (12/29 0700) SpO2:  [62 %-100 %] 77 % (12/29 7858) Arterial Line BP: (77-113)/(61-105) 99/79 (12/29 8502) Weight:  [76.9 kg (169 lb 8.5 oz)] 76.9 kg (169 lb 8.5 oz) (12/29 0500) Last BM Date: 04/17/17 Mean arterial Pressure 70's to 80's  Intake/Output:   Intake/Output Summary (Last 24 hours) at 04/22/2017 0832 Last data filed at 04/22/2017 7741 Gross per 24 hour  Intake 2846.36 ml  Output 1290 ml  Net 1556.36 ml     Physical Exam: General:  Awake and alert. Smiling some.  No resp difficulty but rate still 30's. HEENT: facial ecchymosis unchanged from extractions. Cor:  LVAD hum present. Lungs: clear Abdomen: soft, nontender, nondistended. No hepatosplenomegaly. No bruits or masses. few bowel sounds. Extremities: moderate edema Neuro: alert & orientedx3, cranial nerves grossly intact. moves all 4 extremities w/o difficulty. Affect pleasant  Telemetry: atrial fib 90's  Labs: Basic Metabolic Panel: Recent Labs  Lab 04/19/17 1337 04/19/17 1928  04/20/17 1416 04/20/17 1935 04/20/17 1953 04/21/17 0259 04/21/17 1622 04/21/17 1634 04/22/17 0356  NA  136 134*   < > 135  --  135 134*  --  134* 129*  K 3.2* 3.2*   < > 3.5  --  5.1 4.5  --  4.6 4.6  CL 101 101   < > 103  --  103 103  --  100* 99*  CO2 26 27  --  24  --   --  21*  --   --  19*  GLUCOSE 104* 110*   < > 143*  --  168* 188*  --  104* 109*  BUN 12 14   < > 10  --  11 13  --  14 21*  CREATININE 1.41* 1.48*   < > 1.29* 1.60* 1.30* 1.60* 1.54* 1.40* 2.35*  CALCIUM 8.6* 8.3*  --  8.2*  --   --  8.2*  --   --  8.3*  MG  --   --   --  1.9 3.1*  --  2.3 1.9  --  1.8  PHOS  --   --   --   --   --   --  3.1  --   --  4.4   < > = values in this interval not displayed.    Liver Function Tests: Recent Labs  Lab 04/19/17 1928 04/21/17 0259 04/22/17 0356  AST 20 95* 171*  ALT 12* 19 77*  ALKPHOS 100 61 75  BILITOT 1.1 4.0* 7.4*  PROT 6.5 5.6* 5.8*  ALBUMIN 2.3* 3.0* 2.5*  No results for input(s): LIPASE, AMYLASE in the last 168 hours. No results for input(s): AMMONIA in the last 168 hours.  CBC: Recent Labs  Lab 04/20/17 1416 04/20/17 1935 04/20/17 1953 04/21/17 0259 04/21/17 1622 04/21/17 1634 04/22/17 0356  WBC 23.0* 20.3*  --  18.5* 28.9*  --  36.9*  NEUTROABS  --   --   --  15.5*  --   --  32.8*  HGB 11.4* 9.6* 9.5* 8.9* 8.6* 9.5* 8.4*  HCT 36.4* 30.5* 28.0* 27.6* 26.8* 28.0* 25.9*  MCV 78.3 78.4  --  77.3* 77.2*  --  76.9*  PLT 184  208 194  --  177 154  --  156    INR: Recent Labs  Lab 04/19/17 1337 04/20/17 1416 04/21/17 0259 04/22/17 0356  INR 1.20 1.42  1.44 1.34 1.52    Other results:  EKG:   Imaging: Dg Chest Port 1 View  Result Date: 04/21/2017 CLINICAL DATA:  Left ventricular assist device, endotracheal tube. EXAM: PORTABLE CHEST 1 VIEW COMPARISON:  04/20/2017 and CT chest 03/13/2017. FINDINGS: Endotracheal tube terminates 4.5 cm above the carina. Right IJ Swan-Ganz catheter is coiled in the left pulmonary artery, as before. Pacemaker and ICD lead tips project over the right atrium and right ventricle. Left ventricular assist device is  in place. Left chest tubes are stable in position. Trachea is midline. Heart is enlarged. Mild basilar predominant mixed interstitial and airspace opacification, similar to minimally improved from 04/20/2017. Left costophrenic angle is obscured by the LVAD. No definite right pleural fluid. IMPRESSION: 1. Stable support tubes/lines with the left IJ Swan-Ganz catheter tip in the left pulmonary artery. 2. Pulmonary edema, similar to minimally improved. 3. Mild bibasilar atelectasis. Electronically Signed   By: Lorin Picket M.D.   On: 04/21/2017 07:27   Dg Chest Port 1 View  Result Date: 04/20/2017 CLINICAL DATA:  Post LVAD insertion EXAM: PORTABLE CHEST 1 VIEW COMPARISON:  04/19/2017, 03/13/2017 FINDINGS: Interval intubation, tip of the endotracheal tube is 2.6 cm superior to the carina. Interval sternotomy. Left-sided pacing generator with leads projecting over right atrium and right ventricle. Disconnected epicardial leads on the left side. Probable coronary stent. Right IJ Swan-Ganz catheter with distal portion of the catheter folded back upon itself and the tip directed cephalad over expected region of left pulmonary artery. Placement of chest drainage catheters over the left chest and mediastinum. No definite pneumothorax. Placement of LVAD. Cardiomegaly with mild pulmonary edema. Small pleural effusions and bibasilar atelectasis or consolidation. IMPRESSION: 1. Placement of support lines and tubes as above. Swan-Ganz catheter appears slightly kinked/folded back upon itself distally with the tip directed cephalad and slightly to the patient's left 2. Interval sternotomy changes with placement of LVAD device 3. Cardiomegaly with mild pulmonary edema and development of small pleural effusions and bibasilar consolidations. Electronically Signed   By: Donavan Foil M.D.   On: 04/20/2017 14:57      Medications:     Scheduled Medications: . amiodarone  200 mg Oral BID  . aspirin EC  325 mg Oral Daily   . bisacodyl  10 mg Oral Daily  . Chlorhexidine Gluconate Cloth  6 each Topical Daily  . docusate sodium  200 mg Oral Daily  . insulin aspart  0-24 Units Subcutaneous Q4H  . levalbuterol  1.25 mg Nebulization Q6H  . pantoprazole  40 mg Oral Daily  . sodium chloride flush  10-40 mL Intracatheter Q12H  . sodium chloride flush  3 mL Intravenous Q12H  .  warfarin  1 mg Oral q1800  . Warfarin - Physician Dosing Inpatient   Does not apply q1800     Infusions: . sodium chloride 20 mL/hr at 04/21/17 0800  . sodium chloride 20 mL/hr at 04/21/17 0800  . cefUROXime (ZINACEF)  IV Stopped (04/21/17 2234)  . EPINEPHrine 4 mg in dextrose 5% 250 mL infusion (16 mcg/mL) 4 mcg/min (04/22/17 0630)  . furosemide (LASIX) infusion    . lactated ringers Stopped (04/21/17 1932)  . lactated ringers 20 mL/hr at 04/21/17 0800  . magnesium sulfate 1 - 4 g bolus IVPB    . milrinone 0.25 mcg/kg/min (04/21/17 1929)     PRN Medications:  sodium chloride, linaclotide, morphine injection, ondansetron (ZOFRAN) IV, oxyCODONE, sodium chloride flush, sodium chloride flush, traMADol   Assessment/Plan/Discussion:    1. Ischemic cardiomyopathy s/p multiple PCI's with chronic systolic heart failure and EF 20% on home dobutamine preop with significant improvement in symptoms. Evaluated at [facility] for heart transplant but LVAD felt to be the best treatment for him at this time. POD 2 s/p HeartaMate III LVAD. 2. Hypertension 3. Hyperlipidemia 4. PAF in sinus preop on amiodarone and Eliquis. Now back in atrial fib overnight. 5. Stage III CKD. Acute on chronic postop renal failure with rise in creat to 2.35 with oliguria overnight. This may be due to pump run, non-pulsatile pump flow. Maintain MAP in 80's. Lasix ordered by Dr. Aundra Dubin.  6. LBBB s/p CRT-D upgrade in 10/2016 without improvement in symptoms or EF. LV epicardial leads cut off at the time of surgery. 7. Prior smoking with mild obstruction and severe diffusion defect  on PFT's. Will need attention to pulmonary toilet. CXR looks ok with mild edema. 8.  Postop leukocytosis and fever. I suspect this is inflammatory this early postop. I would observe for now. 9.  Marked rise in bilirubin and some rise in transaminases. Likely due to pump run. Will hold off on Tylenol, Lipitor. He has been on amio preop without difficulty and normal LFT's.  10. Remove MT's today and probably pleural tubes tomorrow.  11. Keep arterial line and swan today. 12. OOB to chair, IS   I reviewed the LVAD parameters from today, and compared the results to the patient's prior recorded data.  No programming changes were made.  The LVAD is functioning within specified parameters. LVAD interrogation was negative for any significant power changes, alarms or PI events/speed drops.  LVAD equipment check completed and is in good working order.  Back-up equipment present.   LVAD education done on emergency procedures and precautions and reviewed exit site care.  Length of Stay: 3  Gaye Pollack 04/22/2017, 8:32 AM

## 2017-04-22 NOTE — Evaluation (Signed)
Occupational Therapy Evaluation Patient Details Name: David [name] MRN: [id number] DOB: Oct 05, 1965 Today's Date: 04/22/2017    History of Present Illness This 51 y.o. male admitted for LVAD.  PMH includes:  Acute on chronic systolic HF, CAD, PAF, CKD stage III, LBBB, frequent PVCs, seizures, PNS, MI, ischemic cardiomyopathy, s/p AICD    Clinical Impression   Pt admitted with above. He demonstrates the below listed deficits and will benefit from continued OT to maximize safety and independence with BADLs.  Pt did very well with initial mobility - required min A +2 to pivot to recliner VSS.  He requires mod - max a for ADLs.  Anticipate good progress.    He lives with wife, who is able to provide assist as needed, and was independent PTA.  Will follow acutely.        Follow Up Recommendations  No OT follow up;Supervision/Assistance - 24 hour    Equipment Recommendations  Tub/shower seat    Recommendations for Other Services       Precautions / Restrictions Precautions Precautions: Fall;Sternal;Other (comment)(LVAD ) Precaution Comments: Pt instructed in sternal precautions, and began instruction on drive line safety       Mobility Bed Mobility Overal bed mobility: Needs Assistance Bed Mobility: Supine to Sit     Supine to sit: Mod assist     General bed mobility comments: assist to lift trunk from bed and to scoot to EOB   Transfers Overall transfer level: Needs assistance   Transfers: Sit to/from Stand;Stand Pivot Transfers Sit to Stand: Min assist;+2 physical assistance;+2 safety/equipment Stand pivot transfers: Min assist;+2 physical assistance;+2 safety/equipment       General transfer comment: assist to move into standing and assist to steady     Balance Overall balance assessment: Needs assistance Sitting-balance support: Feet supported Sitting balance-Leahy Scale: Fair     Standing balance support: Bilateral upper extremity supported Standing  balance-Leahy Scale: Poor Standing balance comment: requires min A                           ADL either performed or assessed with clinical judgement   ADL Overall ADL's : Needs assistance/impaired Eating/Feeding: Modified independent;Bed level   Grooming: Wash/dry hands;Wash/dry face;Oral care;Set up;Sitting   Upper Body Bathing: Moderate assistance;Sitting   Lower Body Bathing: Maximal assistance;Sit to/from stand   Upper Body Dressing : Maximal assistance;Sitting Upper Body Dressing Details (indicate cue type and reason): due to lines  Lower Body Dressing: Total assistance;Sit to/from stand   Toilet Transfer: Minimal assistance;+2 for physical assistance;Stand-pivot;BSC   Toileting- Clothing Manipulation and Hygiene: Maximal assistance;Sit to/from stand       Functional mobility during ADLs: Minimal assistance;+2 for physical assistance       Vision         Perception     Praxis      Pertinent Vitals/Pain Pain Assessment: Faces Faces Pain Scale: Hurts even more Pain Location: chest/incisional  Pain Descriptors / Indicators: Operative site guarding;Grimacing;Guarding Pain Intervention(s): Monitored during session;Repositioned     Hand Dominance Right   Extremity/Trunk Assessment Upper Extremity Assessment Upper Extremity Assessment: Generalized weakness   Lower Extremity Assessment Lower Extremity Assessment: Defer to PT evaluation   Cervical / Trunk Assessment Cervical / Trunk Assessment: Normal   Communication Communication Communication: No difficulties   Cognition Arousal/Alertness: Awake/alert Behavior During Therapy: Flat affect Overall Cognitive Status: Within Functional Limits for tasks assessed  General Comments: WFL for basic info    General Comments  VSS     Exercises     Shoulder Instructions      Home Living Family/patient expects to be discharged to:: Private  residence Living Arrangements: Spouse/significant other Available Help at Discharge: Family;Available 24 hours/day Type of Home: Apartment Home Access: Level entry     Home Layout: One level     Bathroom Shower/Tub: Tub/shower unit;Curtain   Biochemist, clinical: Standard     Home Equipment: None          Prior Functioning/Environment Level of Independence: Independent        Comments: Pt reports he does not drive, but does occasionally assist with grocery shopping and was able to walk through grocery store ~3 weeks PTA         OT Problem List: Decreased strength;Decreased activity tolerance;Impaired balance (sitting and/or standing);Decreased knowledge of use of DME or AE;Decreased knowledge of precautions;Cardiopulmonary status limiting activity;Pain      OT Treatment/Interventions: Self-care/ADL training;Therapeutic exercise;Therapeutic activities;Patient/family education;Balance training;DME and/or AE instruction;Energy conservation    OT Goals(Current goals can be found in the care plan section) Acute Rehab OT Goals Patient Stated Goal: To get stronger and go home  OT Goal Formulation: With patient Time For Goal Achievement: 05/06/17 Potential to Achieve Goals: Good ADL Goals Pt Will Perform Grooming: with modified independence;standing Pt Will Perform Upper Body Bathing: with modified independence;standing Pt Will Perform Lower Body Bathing: with modified independence;sit to/from stand Pt Will Perform Upper Body Dressing: with modified independence;sitting Pt Will Perform Lower Body Dressing: with modified independence;sit to/from stand Pt Will Transfer to Toilet: with modified independence;ambulating;regular height toilet Pt Will Perform Toileting - Clothing Manipulation and hygiene: with modified independence;sit to/from stand Pt Will Perform Tub/Shower Transfer: Tub transfer;with supervision;ambulating Additional ADL Goal #1: Pt will independently manage LVAD  equipment during ADLs  OT Frequency: Min 2X/week   Barriers to D/C:            Co-evaluation PT/OT/SLP Co-Evaluation/Treatment: Yes Reason for Co-Treatment: Complexity of the patient's impairments (multi-system involvement);For patient/therapist safety   OT goals addressed during session: Strengthening/ROM      AM-PAC PT "6 Clicks" Daily Activity     Outcome Measure Help from another person eating meals?: None Help from another person taking care of personal grooming?: A Little Help from another person toileting, which includes using toliet, bedpan, or urinal?: A Lot Help from another person bathing (including washing, rinsing, drying)?: A Lot Help from another person to put on and taking off regular upper body clothing?: A Lot Help from another person to put on and taking off regular lower body clothing?: Total 6 Click Score: 14   End of Session Equipment Utilized During Treatment: Oxygen Nurse Communication: Mobility status  Activity Tolerance: Patient tolerated treatment well Patient left: in chair;with call bell/phone within reach;with nursing/sitter in room  OT Visit Diagnosis: Unsteadiness on feet (R26.81);Muscle weakness (generalized) (M62.81)                Time: 3825-0539 OT Time Calculation (min): 24 min Charges:  OT General Charges $OT Visit: 1 Visit OT Evaluation $OT Eval High Complexity: 1 High G-Codes:     [facility], OTR/L [phone]   [name] M 04/22/2017, 10:25 AM

## 2017-04-22 NOTE — Plan of Care (Signed)
  Education: Knowledge of General Education information will improve 04/22/2017 1745 - Progressing by Etta Quill, RN   Activity: Risk for activity intolerance will decrease 04/22/2017 1745 - Progressing by Etta Quill, RN   Coping: Level of anxiety will decrease 04/22/2017 1745 - Progressing by Etta Quill, RN   Education: Ability to demonstrate proper wound care will improve 04/22/2017 1745 - Progressing by Etta Quill, RN   Education: Knowledge of disease or condition will improve 04/22/2017 1745 - Progressing by Etta Quill, RN   Education: Knowledge of the prescribed therapeutic regimen will improve 04/22/2017 1745 - Progressing by Etta Quill, RN

## 2017-04-23 ENCOUNTER — Inpatient Hospital Stay (HOSPITAL_COMMUNITY): Payer: Medicare HMO

## 2017-04-23 ENCOUNTER — Inpatient Hospital Stay (HOSPITAL_COMMUNITY): Payer: Medicare HMO | Admitting: Certified Registered"

## 2017-04-23 DIAGNOSIS — I10 Essential (primary) hypertension: Secondary | ICD-10-CM

## 2017-04-23 DIAGNOSIS — N179 Acute kidney failure, unspecified: Secondary | ICD-10-CM

## 2017-04-23 LAB — POCT I-STAT 3, ART BLOOD GAS (G3+)
ACID-BASE DEFICIT: 7 mmol/L — AB (ref 0.0–2.0)
ACID-BASE DEFICIT: 8 mmol/L — AB (ref 0.0–2.0)
ACID-BASE DEFICIT: 3 mmol/L — AB (ref 0.0–2.0)
ACID-BASE DEFICIT: 1 mmol/L (ref 0.0–2.0)
Acid-base deficit: 6 mmol/L — ABNORMAL HIGH (ref 0.0–2.0)
Acid-base deficit: 5 mmol/L — ABNORMAL HIGH (ref 0.0–2.0)
Acid-base deficit: 5 mmol/L — ABNORMAL HIGH (ref 0.0–2.0)
Acid-base deficit: 6 mmol/L — ABNORMAL HIGH (ref 0.0–2.0)
Acid-base deficit: 1 mmol/L (ref 0.0–2.0)
BICARBONATE: 18.7 mmol/L — AB (ref 20.0–28.0)
BICARBONATE: 19.9 mmol/L — AB (ref 20.0–28.0)
BICARBONATE: 20.4 mmol/L (ref 20.0–28.0)
BICARBONATE: 25.4 mmol/L (ref 20.0–28.0)
BICARBONATE: 24.4 mmol/L (ref 20.0–28.0)
Bicarbonate: 18.6 mmol/L — ABNORMAL LOW (ref 20.0–28.0)
Bicarbonate: 20.8 mmol/L (ref 20.0–28.0)
Bicarbonate: 21.8 mmol/L (ref 20.0–28.0)
Bicarbonate: 26.4 mmol/L (ref 20.0–28.0)
O2 SAT: 67 %
O2 SAT: 92 %
O2 SAT: 86 %
O2 SAT: 93 %
O2 Saturation: 80 %
O2 Saturation: 94 %
O2 Saturation: 95 %
O2 Saturation: 90 %
O2 Saturation: 94 %
PCO2 ART: 33.8 mmHg (ref 32.0–48.0)
PCO2 ART: 39 mmHg (ref 32.0–48.0)
PCO2 ART: 43.9 mmHg (ref 32.0–48.0)
PCO2 ART: 50.5 mmHg — AB (ref 32.0–48.0)
PCO2 ART: 43.4 mmHg (ref 32.0–48.0)
PH ART: 7.348 — AB (ref 7.350–7.450)
PH ART: 7.335 — AB (ref 7.350–7.450)
PH ART: 7.274 — AB (ref 7.350–7.450)
PH ART: 7.143 — AB (ref 7.350–7.450)
PO2 ART: 45 mmHg — AB (ref 83.0–108.0)
PO2 ART: 69 mmHg — AB (ref 83.0–108.0)
PO2 ART: 53 mmHg — AB (ref 83.0–108.0)
PO2 ART: 74 mmHg — AB (ref 83.0–108.0)
Patient temperature: 34.8
Patient temperature: 35.2
Patient temperature: 36.7
TCO2: 20 mmol/L — AB (ref 22–32)
TCO2: 20 mmol/L — AB (ref 22–32)
TCO2: 21 mmol/L — AB (ref 22–32)
TCO2: 22 mmol/L (ref 22–32)
TCO2: 22 mmol/L (ref 22–32)
TCO2: 24 mmol/L (ref 22–32)
TCO2: 27 mmol/L (ref 22–32)
TCO2: 28 mmol/L (ref 22–32)
TCO2: 26 mmol/L (ref 22–32)
pCO2 arterial: 33.8 mmHg (ref 32.0–48.0)
pCO2 arterial: 36.9 mmHg (ref 32.0–48.0)
pCO2 arterial: 61.9 mmHg — ABNORMAL HIGH (ref 32.0–48.0)
pCO2 arterial: 56.5 mmHg — ABNORMAL HIGH (ref 32.0–48.0)
pH, Arterial: 7.313 — ABNORMAL LOW (ref 7.350–7.450)
pH, Arterial: 7.377 (ref 7.350–7.450)
pH, Arterial: 7.248 — ABNORMAL LOW (ref 7.350–7.450)
pH, Arterial: 7.317 — ABNORMAL LOW (ref 7.350–7.450)
pH, Arterial: 7.357 (ref 7.350–7.450)
pO2, Arterial: 36 mmHg — CL (ref 83.0–108.0)
pO2, Arterial: 70 mmHg — ABNORMAL LOW (ref 83.0–108.0)
pO2, Arterial: 74 mmHg — ABNORMAL LOW (ref 83.0–108.0)
pO2, Arterial: 84 mmHg (ref 83.0–108.0)
pO2, Arterial: 67 mmHg — ABNORMAL LOW (ref 83.0–108.0)

## 2017-04-23 LAB — PHOSPHORUS: PHOSPHORUS: 5.5 mg/dL — AB (ref 2.5–4.6)

## 2017-04-23 LAB — TYPE AND SCREEN
ABO/RH(D): O POS
ANTIBODY SCREEN: NEGATIVE
UNIT DIVISION: 0
UNIT DIVISION: 0
UNIT DIVISION: 0
Unit division: 0
Unit division: 0
Unit division: 0

## 2017-04-23 LAB — BPAM RBC
BLOOD PRODUCT EXPIRATION DATE: 201901192359
BLOOD PRODUCT EXPIRATION DATE: 201901192359
Blood Product Expiration Date: 201901182359
Blood Product Expiration Date: 201901182359
Blood Product Expiration Date: 201901192359
Blood Product Expiration Date: 201901192359
ISSUE DATE / TIME: 201812270708
ISSUE DATE / TIME: 201812270708
ISSUE DATE / TIME: 201812290625
ISSUE DATE / TIME: 201812300332
ISSUE DATE / TIME: 201812270708
UNIT TYPE AND RH: 5100
UNIT TYPE AND RH: 5100
Unit Type and Rh: 5100
Unit Type and Rh: 5100
Unit Type and Rh: 5100
Unit Type and Rh: 5100

## 2017-04-23 LAB — CBC WITH DIFFERENTIAL/PLATELET
BASOS ABS: 0 10*3/uL (ref 0.0–0.1)
Basophils Relative: 0 %
EOS ABS: 0 10*3/uL (ref 0.0–0.7)
Eosinophils Relative: 0 %
HEMATOCRIT: 26.4 % — AB (ref 39.0–52.0)
HEMOGLOBIN: 8.8 g/dL — AB (ref 13.0–17.0)
LYMPHS PCT: 4 %
Lymphs Abs: 1.4 10*3/uL (ref 0.7–4.0)
MCH: 25.7 pg — ABNORMAL LOW (ref 26.0–34.0)
MCHC: 33.3 g/dL (ref 30.0–36.0)
MCV: 77 fL — ABNORMAL LOW (ref 78.0–100.0)
MONOS PCT: 4 %
Monocytes Absolute: 1.4 10*3/uL — ABNORMAL HIGH (ref 0.1–1.0)
NEUTROS PCT: 92 %
Neutro Abs: 31.9 10*3/uL — ABNORMAL HIGH (ref 1.7–7.7)
Platelets: 143 10*3/uL — ABNORMAL LOW (ref 150–400)
RBC: 3.43 MIL/uL — AB (ref 4.22–5.81)
RDW: 17.7 % — ABNORMAL HIGH (ref 11.5–15.5)
WBC: 34.7 10*3/uL — AB (ref 4.0–10.5)

## 2017-04-23 LAB — RENAL FUNCTION PANEL
ANION GAP: 8 (ref 5–15)
Albumin: 2.1 g/dL — ABNORMAL LOW (ref 3.5–5.0)
BUN: 32 mg/dL — ABNORMAL HIGH (ref 6–20)
CHLORIDE: 98 mmol/L — AB (ref 101–111)
CO2: 23 mmol/L (ref 22–32)
CREATININE: 3.13 mg/dL — AB (ref 0.61–1.24)
Calcium: 7.5 mg/dL — ABNORMAL LOW (ref 8.9–10.3)
GFR, EST AFRICAN AMERICAN: 25 mL/min — AB (ref >60)
GFR, EST NON AFRICAN AMERICAN: 21 mL/min — AB (ref >60)
Glucose, Bld: 107 mg/dL — ABNORMAL HIGH (ref 65–99)
POTASSIUM: 4.7 mmol/L (ref 3.5–5.1)
Phosphorus: 6.5 mg/dL — ABNORMAL HIGH (ref 2.5–4.6)
Sodium: 129 mmol/L — ABNORMAL LOW (ref 135–145)

## 2017-04-23 LAB — BLOOD GAS, ARTERIAL
ACID-BASE DEFICIT: 8 mmol/L — AB (ref 0.0–2.0)
BICARBONATE: 17.3 mmol/L — AB (ref 20.0–28.0)
DRAWN BY: 517021
Nitric Oxide: 5.2
O2 CONTENT: 6 L/min
O2 Saturation: 64.4 %
PCO2 ART: 37.2 mmHg (ref 32.0–48.0)
PH ART: 7.291 — AB (ref 7.350–7.450)
Patient temperature: 98.6
pO2, Arterial: 42 mmHg — ABNORMAL LOW (ref 83.0–108.0)

## 2017-04-23 LAB — GLUCOSE, CAPILLARY
GLUCOSE-CAPILLARY: 104 mg/dL — AB (ref 65–99)
GLUCOSE-CAPILLARY: 116 mg/dL — AB (ref 65–99)
GLUCOSE-CAPILLARY: 119 mg/dL — AB (ref 65–99)
GLUCOSE-CAPILLARY: 120 mg/dL — AB (ref 65–99)
Glucose-Capillary: 114 mg/dL — ABNORMAL HIGH (ref 65–99)
Glucose-Capillary: 125 mg/dL — ABNORMAL HIGH (ref 65–99)

## 2017-04-23 LAB — COOXEMETRY PANEL
Carboxyhemoglobin: 1.3 % (ref 0.5–1.5)
Carboxyhemoglobin: 1.7 % — ABNORMAL HIGH (ref 0.5–1.5)
METHEMOGLOBIN: 0.8 % (ref 0.0–1.5)
Methemoglobin: 1.1 % (ref 0.0–1.5)
O2 SAT: 20 %
O2 Saturation: 29.1 %
TOTAL HEMOGLOBIN: 16.9 g/dL — AB (ref 12.0–16.0)
Total hemoglobin: 9.3 g/dL — ABNORMAL LOW (ref 12.0–16.0)

## 2017-04-23 LAB — COMPREHENSIVE METABOLIC PANEL
ALBUMIN: 2.2 g/dL — AB (ref 3.5–5.0)
ALK PHOS: 95 U/L (ref 38–126)
ALT: 208 U/L — ABNORMAL HIGH (ref 17–63)
ANION GAP: 12 (ref 5–15)
AST: 319 U/L — ABNORMAL HIGH (ref 15–41)
BILIRUBIN TOTAL: 9.6 mg/dL — AB (ref 0.3–1.2)
BUN: 36 mg/dL — ABNORMAL HIGH (ref 6–20)
CALCIUM: 7.8 mg/dL — AB (ref 8.9–10.3)
CO2: 19 mmol/L — ABNORMAL LOW (ref 22–32)
Chloride: 95 mmol/L — ABNORMAL LOW (ref 101–111)
Creatinine, Ser: 3.34 mg/dL — ABNORMAL HIGH (ref 0.61–1.24)
GFR, EST AFRICAN AMERICAN: 23 mL/min — AB (ref >60)
GFR, EST NON AFRICAN AMERICAN: 20 mL/min — AB (ref >60)
GLUCOSE: 129 mg/dL — AB (ref 65–99)
POTASSIUM: 3.8 mmol/L (ref 3.5–5.1)
Sodium: 126 mmol/L — ABNORMAL LOW (ref 135–145)
TOTAL PROTEIN: 5.7 g/dL — AB (ref 6.5–8.1)

## 2017-04-23 LAB — PROTIME-INR
INR: 1.73
Prothrombin Time: 20.1 seconds — ABNORMAL HIGH (ref 11.4–15.2)

## 2017-04-23 LAB — LACTATE DEHYDROGENASE: LDH: 923 U/L — AB (ref 98–192)

## 2017-04-23 LAB — HEMOGLOBIN AND HEMATOCRIT, BLOOD
HEMATOCRIT: 25.4 % — AB (ref 39.0–52.0)
Hemoglobin: 8.3 g/dL — ABNORMAL LOW (ref 13.0–17.0)

## 2017-04-23 LAB — MAGNESIUM: MAGNESIUM: 2.5 mg/dL — AB (ref 1.7–2.4)

## 2017-04-23 MED ORDER — NOREPINEPHRINE BITARTRATE 1 MG/ML IV SOLN
0.0000 ug/min | INTRAVENOUS | Status: DC
  Administered 2017-04-23: 10 ug/min via INTRAVENOUS
  Administered 2017-04-24: 30 ug/min via INTRAVENOUS
  Administered 2017-04-24 – 2017-04-25 (×3): 40 ug/min via INTRAVENOUS
  Administered 2017-04-25: 26 ug/min via INTRAVENOUS
  Administered 2017-04-27: 2 ug/min via INTRAVENOUS
  Administered 2017-04-30: 5 ug/min via INTRAVENOUS
  Filled 2017-04-23 (×11): qty 16

## 2017-04-23 MED ORDER — PRISMASOL BGK 4/2.5 32-4-2.5 MEQ/L IV SOLN
INTRAVENOUS | Status: DC
Start: 2017-04-23 — End: 2017-05-05
  Administered 2017-04-23 – 2017-05-04 (×75): via INTRAVENOUS_CENTRAL
  Filled 2017-04-23 (×104): qty 5000

## 2017-04-23 MED ORDER — PRISMASOL BGK 4/2.5 32-4-2.5 MEQ/L IV SOLN
INTRAVENOUS | Status: DC
  Administered 2017-04-23 – 2017-05-04 (×20): via INTRAVENOUS_CENTRAL
  Filled 2017-04-23 (×26): qty 5000

## 2017-04-23 MED ORDER — SODIUM BICARBONATE 8.4 % IV SOLN
100.0000 meq | Freq: Once | INTRAVENOUS | Status: AC
  Administered 2017-04-23: 100 meq via INTRAVENOUS
  Filled 2017-04-23: qty 100

## 2017-04-23 MED ORDER — CHLORHEXIDINE GLUCONATE 0.12% ORAL RINSE (MEDLINE KIT)
15.0000 mL | Freq: Two times a day (BID) | OROMUCOSAL | Status: DC
  Administered 2017-04-24 – 2017-04-26 (×5): 15 mL via OROMUCOSAL

## 2017-04-23 MED ORDER — SODIUM CHLORIDE 0.9 % FOR CRRT
INTRAVENOUS_CENTRAL | Status: DC | PRN
  Filled 2017-04-23: qty 1000

## 2017-04-23 MED ORDER — HEPARIN SODIUM (PORCINE) 1000 UNIT/ML DIALYSIS
1000.0000 [IU] | INTRAMUSCULAR | Status: DC | PRN
  Administered 2017-04-23 – 2017-05-04 (×2): 2800 [IU] via INTRAVENOUS_CENTRAL
  Filled 2017-04-23: qty 6
  Filled 2017-04-23: qty 4
  Filled 2017-04-23 (×2): qty 6
  Filled 2017-04-23: qty 3

## 2017-04-23 MED ORDER — PRISMASOL BGK 4/2.5 32-4-2.5 MEQ/L IV SOLN
INTRAVENOUS | Status: DC
  Administered 2017-04-23 – 2017-05-04 (×10): via INTRAVENOUS_CENTRAL
  Filled 2017-04-23 (×13): qty 5000

## 2017-04-23 MED ORDER — DEXTROSE 5 % IV SOLN
2.0000 g | Freq: Once | INTRAVENOUS | Status: AC
  Administered 2017-04-23: 2 g via INTRAVENOUS
  Filled 2017-04-23: qty 2

## 2017-04-23 MED ORDER — DEXMEDETOMIDINE HCL IN NACL 400 MCG/100ML IV SOLN
0.1000 ug/kg/h | INTRAVENOUS | Status: DC
Start: 2017-04-23 — End: 2017-04-26
  Administered 2017-04-23 – 2017-04-26 (×6): 0.5 ug/kg/h via INTRAVENOUS
  Filled 2017-04-23 (×6): qty 100

## 2017-04-23 MED ORDER — SODIUM CHLORIDE 0.9 % IV SOLN
Freq: Once | INTRAVENOUS | Status: AC
  Administered 2017-04-23: via INTRAVENOUS

## 2017-04-23 MED ORDER — VASOPRESSIN 20 UNIT/ML IV SOLN
0.0600 [IU]/min | INTRAVENOUS | Status: DC
  Administered 2017-04-23: 0.03 [IU]/min via INTRAVENOUS
  Administered 2017-04-24: 0.04 [IU]/min via INTRAVENOUS
  Administered 2017-04-24 – 2017-04-25 (×2): 0.06 [IU]/min via INTRAVENOUS
  Administered 2017-04-26: 0.03 [IU]/min via INTRAVENOUS
  Filled 2017-04-23 (×5): qty 2

## 2017-04-23 MED ORDER — SODIUM BICARBONATE 8.4 % IV SOLN
50.0000 meq | Freq: Once | INTRAVENOUS | Status: AC
  Administered 2017-04-23: 50 meq via INTRAVENOUS
  Filled 2017-04-23: qty 50

## 2017-04-23 MED ORDER — ORAL CARE MOUTH RINSE
15.0000 mL | Freq: Two times a day (BID) | OROMUCOSAL | Status: DC
  Administered 2017-04-23: 15 mL via OROMUCOSAL

## 2017-04-23 MED ORDER — ETOMIDATE 2 MG/ML IV SOLN
INTRAVENOUS | Status: DC | PRN
  Administered 2017-04-23: 10 mg via INTRAVENOUS

## 2017-04-23 MED ORDER — CHLORHEXIDINE GLUCONATE 0.12 % MT SOLN
15.0000 mL | Freq: Two times a day (BID) | OROMUCOSAL | Status: DC
Start: 2017-04-23 — End: 2017-04-23
  Administered 2017-04-23 (×2): 15 mL via OROMUCOSAL

## 2017-04-23 MED ORDER — ORAL CARE MOUTH RINSE
15.0000 mL | Freq: Four times a day (QID) | OROMUCOSAL | Status: DC
  Administered 2017-04-24 (×2): 15 mL via OROMUCOSAL

## 2017-04-23 MED ORDER — DEXTROSE 5 % IV SOLN
2.0000 g | Freq: Two times a day (BID) | INTRAVENOUS | Status: DC
  Administered 2017-04-23 – 2017-04-27 (×8): 2 g via INTRAVENOUS
  Filled 2017-04-23 (×9): qty 2

## 2017-04-23 MED ORDER — SUCCINYLCHOLINE CHLORIDE 20 MG/ML IJ SOLN
INTRAMUSCULAR | Status: DC | PRN
  Administered 2017-04-23: 100 mg via INTRAVENOUS

## 2017-04-23 NOTE — CV Procedure (Signed)
  Insertion of Temporary dialysis catheter:  After obtaining informed consent from patient the right subclavian field was sterilely prepped and draped. 1% lidocaine local anesthesia was used. The right subclavian vein was cannulated without difficulty and a 20 cm Power Trialysis catheter was inserted using Seldinger technique. Good blood return, flushed with saline and capped. CXR pending. Sterile dressing applied.

## 2017-04-23 NOTE — Transfer of Care (Signed)
Immediate Anesthesia Transfer of Care Note  Patient: David [name]  Procedure(s) Performed: AN AD [place] INTUBATION  Patient Location: ICU  Anesthesia Type:General  Level of Consciousness: sedated and Patient remains intubated per anesthesia plan  Airway & Oxygen Therapy: Patient placed on Ventilator (see vital sign flow sheet for setting)  Post-op Assessment: Report given to RN and Post -op Vital signs reviewed and stable  Post vital signs: Reviewed and stable  Last Vitals:  Vitals:   04/23/17 1600 04/23/17 1638  BP:    Pulse:    Resp: 17 (!) 33  Temp: (!) 34.7 C (!) 34.5 C  SpO2:      Last Pain:  Vitals:   04/23/17 1712  TempSrc: Core  PainSc:       Patients Stated Pain Goal: 5 (16/96/78 9381)  Complications: No apparent anesthesia complications

## 2017-04-23 NOTE — Consult Note (Addendum)
[facility] Consult Note     Date: 04/23/2017                  Patient Name:  David [name]  MRN: [id number]  DOB: February 25, 1966  Age / Sex: 51 y.o., male         PCP: Raina Mina., MD                 Service Requesting Consult: Cardiothoracic surgery- Dr. Cyndia Bent                 Reason for Consult: Acute oliguric kidney injury            Chief Complaint: cardiomyopathy s/p VAD  HPI: Pt is a 18M with a PMH significant for HTN, HLD, ischemic cardiomyopathy with EF 20-25% previously on home inotropes who is s/p VAD on 04/20/2017.  We are asked to see for acute oliguric kidney injury and volume overload.    Pt's pre-op creatinine was 1.3-1.4.  He underwent the placement of a Heartmate III 04/20/17.  Current VAD speed is 5400 rpms and most recent CO is 6.3 with a CI of 3.5.  PA pressure 53/23 with most recent CVP 19.  Destination therapy for now with possible bridge to transplant (has type O blood which will make wait list time long).  Creatinine has been acutely rising since VAD and is now 3.34.  He has made 70 cc of urine today on a Lasix gtt with an overall net + of 6.5 L.  He is on continuous BiPaP.  Pt reports SOB.  He appears uncomfortable.  He denies n/v, f/c.  Having some expected postop soreness.    Past Medical History:  Diagnosis Date  . AICD (automatic cardioverter/defibrillator) present   . Anemia   . Anxiety   . CAD (coronary artery disease) 2009   AMI in 12/2007 with PCI to LAD, staged PCI to  mid/distal RCA, NSTEMI in 02/2009 with BMS to LCx  . CHF (congestive heart failure) ([place])   . Chronic kidney disease 11/03/2016   stage 3 kidney disease  . Dysrhythmia    "arrythmia problems at some point", "fatal rhythms"  . History of blood transfusion    "I was bleeding from was rectum" (04/19/2017)  . HLD (hyperlipidemia)   . HTN (hypertension)   . Ischemic cardiomyopathy    Admitted in 07/2010 with CHF exacerbation  . Myocardial infarction ([place])     "I've had 4" (04/19/2017)  . Pneumonia 2018 X 1  . Seizures ([place])    one seizure in 04/2016 during cardiac event (04/19/2017)    Past Surgical History:  Procedure Laterality Date  . AORTIC VALVE REPAIR N/A 04/20/2017   Procedure: AORTIC VALVE REPAIR;  Surgeon: Gaye Pollack, MD;  Location: [facility];  Service: Open Heart Surgery;  Laterality: N/A;  . BIV ICD INSERTION CRT-D N/A 08/01/2016   Procedure: BiV ICD Insertion CRT-D;  Surgeon: Will Meredith Leeds, MD;  Location: [facility] [facility];  Service: Cardiovascular;  Laterality: N/A;  . CARDIAC CATHETERIZATION N/A 05/05/2016   Procedure: Right/Left Heart Cath and Coronary Angiography;  Surgeon: Jolaine Artist, MD;  Location: [place] [facility];  Service: Cardiovascular;  Laterality: N/A;  . CARDIAC CATHETERIZATION N/A 05/09/2016   Procedure: Coronary Stent Intervention;  Surgeon: Peter M Martinique, MD;  Location: [place] [facility];  Service: Cardiovascular;  Laterality: N/A;  . CARDIAC CATHETERIZATION N/A 05/09/2016   Procedure: Intravascular Pressure Wire/FFR Study;  Surgeon: Ander Slade  Martinique, MD;  Location: [place] [facility];  Service: Cardiovascular;  Laterality: N/A;  . CORONARY ANGIOPLASTY WITH STENT PLACEMENT     "i've got a total of 12 stents" (04/19/2017)  . ELECTROPHYSIOLOGY STUDY N/A 06/24/2016   Procedure: Electrophysiology Study;  Surgeon: Deboraha Sprang, MD;  Location: [facility] [facility];  Service: Cardiovascular;  Laterality: N/A;  . EPICARDIAL PACING LEAD PLACEMENT Left 11/07/2016   Procedure: LV EPICARDIAL PACING LEAD PLACEMENT VIA LEFT MINI THORACOTOMY;  Surgeon: Gaye Pollack, MD;  Location: [facility];  Service: Thoracic;  Laterality: Left;  . ICD IMPLANT N/A 06/24/2016   Procedure: POSSIBLE  ICD Implant;  Surgeon: Deboraha Sprang, MD;  Location: [place] [facility];  Service: Cardiovascular;  Laterality: N/A;  . INSERTION OF IMPLANTABLE LEFT VENTRICULAR ASSIST DEVICE N/A 04/20/2017   Procedure: INSERTION OF IMPLANTABLE LEFT VENTRICULAR  ASSIST DEVICE, Aortic Valve repair;  Surgeon: Gaye Pollack, MD;  Location: [facility];  Service: Open Heart Surgery;  Laterality: N/A;  Heartmate 3  . IR FLUORO GUIDE CV MIDLINE PICC RIGHT  03/14/2017  . IR US GUIDE VASC ACCESS RIGHT  03/14/2017  . LEFT HEART CATHETERIZATION WITH CORONARY ANGIOGRAM N/A 03/13/2011   Procedure: LEFT HEART CATHETERIZATION WITH CORONARY ANGIOGRAM;  Surgeon: Lorretta Harp, MD;  Location: [facility];  Service: Cardiovascular;  Laterality: N/A;  . MULTIPLE EXTRACTIONS WITH ALVEOLOPLASTY N/A 04/12/2017   Procedure: Extraction of tooth #'s 2, 5-12, 17, 20-29 with alveoloplasty amd maxillary right buccal exostoses reductions.;  Surgeon: Lenn Cal, DDS;  Location: [place];  Service: Oral Surgery;  Laterality: N/A;  . RIGHT HEART CATH N/A 04/19/2017   Procedure: RIGHT HEART CATH;  Surgeon: Jolaine Artist, MD;  Location: [place] [facility];  Service: Cardiovascular;  Laterality: N/A;  . RIGHT/LEFT HEART CATH AND CORONARY ANGIOGRAPHY N/A 03/10/2017   Procedure: RIGHT/LEFT HEART CATH AND CORONARY ANGIOGRAPHY;  Surgeon: Jolaine Artist, MD;  Location: [place] [facility];  Service: Cardiovascular;  Laterality: N/A;  . TEE WITHOUT CARDIOVERSION N/A 04/20/2017   Procedure: TRANSESOPHAGEAL ECHOCARDIOGRAM (TEE);  Surgeon: Gaye Pollack, MD;  Location: [place];  Service: Open Heart Surgery;  Laterality: N/A;    Family History  Problem Relation Age of Onset  . Coronary artery disease Father   . Hypertension Father   . Hypertension Sister   . Coronary artery disease Brother   . Emphysema Brother   . Coronary artery disease Mother   . Hypertension Mother   . Emphysema Mother   . Hypertension Daughter   . Obesity Daughter   . Diabetes Unknown   . Hypertension Unknown   . Coronary artery disease Unknown    Social History:  reports that he quit smoking about a year ago. His smoking use included cigarettes and cigars. He has a 21.50 pack-year smoking history. he  has never used smokeless tobacco. He reports that he does not drink alcohol or use drugs.  Allergies:  Allergies  Allergen Reactions  . Plavix [Clopidogrel Bisulfate] Hives    Medications Prior to Admission  Medication Sig Dispense Refill  . amiodarone (PACERONE) 200 MG tablet Take 200 mg by mouth 2 (two) times daily.    Marland Kitchen aspirin 81 MG chewable tablet Chew 1 tablet (81 mg total) by mouth daily. 30 tablet 6  . atorvastatin (LIPITOR) 40 MG tablet Take 1 tablet (40 mg total) by mouth daily at 6 PM. 30 tablet 6  . DOBUTamine (DOBUTREX) 4-5 MG/ML-% infusion Inject 352.5 mcg/min into  the vein continuous. 250 mL 0  . furosemide (LASIX) 80 MG tablet Take 80 mg by mouth daily.    . ivabradine (CORLANOR) 5 MG TABS tablet Take 0.5 tablets (2.5 mg total) by mouth 2 (two) times daily with a meal. 60 tablet 0  . losartan (COZAAR) 25 MG tablet Take 0.5 tablets (12.5 mg total) by mouth daily. 15 tablet 6  . Multiple Vitamins-Minerals (ADULT GUMMY) CHEW Chew 2 tablets by mouth daily.    Marland Kitchen oxyCODONE-acetaminophen (PERCOCET) 5-325 MG tablet Take one or two tablets by mouth every 6 hours as needed for pain. 32 tablet 0  . acetaminophen (TYLENOL) 325 MG tablet Take 2 tablets (650 mg total) by mouth every 4 (four) hours as needed for headache or mild pain.    Marland Kitchen linaclotide (LINZESS) 290 MCG CAPS capsule Take 290 mcg by mouth daily as needed (constipation).      Results for orders placed or performed during the hospital encounter of 04/19/17 (from the past 48 hour(s))  I-STAT 3, arterial blood gas (G3+)     Status: None   Collection Time: 04/21/17  8:23 AM  Result Value Ref Range   pH, Arterial 7.386 7.350 - 7.450   pCO2 arterial 38.9 32.0 - 48.0 mmHg   pO2, Arterial 100.0 83.0 - 108.0 mmHg   Bicarbonate 23.1 20.0 - 28.0 mmol/L   TCO2 24 22 - 32 mmol/L   O2 Saturation 97.0 %   Acid-base deficit 1.0 0.0 - 2.0 mmol/L   Patient temperature 38.1 C    Collection site ARTERIAL LINE    Drawn by Operator     Sample type ARTERIAL   I-STAT 3, arterial blood gas (G3+)     Status: Abnormal   Collection Time: 04/21/17  9:44 AM  Result Value Ref Range   pH, Arterial 7.398 7.350 - 7.450   pCO2 arterial 34.8 32.0 - 48.0 mmHg   pO2, Arterial 67.0 (L) 83.0 - 108.0 mmHg   Bicarbonate 21.3 20.0 - 28.0 mmol/L   TCO2 22 22 - 32 mmol/L   O2 Saturation 92.0 %   Acid-base deficit 3.0 (H) 0.0 - 2.0 mmol/L   Patient temperature 37.8 C    Collection site ARTERIAL LINE    Drawn by Operator    Sample type ARTERIAL   Glucose, capillary     Status: Abnormal   Collection Time: 04/21/17 11:32 AM  Result Value Ref Range   Glucose-Capillary 132 (H) 65 - 99 mg/dL   Comment 1 Venous Specimen   .Cooxemetry Panel (carboxy, met, total hgb, O2 sat)     Status: Abnormal   Collection Time: 04/21/17 11:35 AM  Result Value Ref Range   Total hemoglobin 8.7 (L) 12.0 - 16.0 g/dL   O2 Saturation 44.0 %   Carboxyhemoglobin 1.1 0.5 - 1.5 %   Methemoglobin 1.4 0.0 - 1.5 %  .Cooxemetry Panel (carboxy, met, total hgb, O2 sat)     Status: Abnormal   Collection Time: 04/21/17 12:15 PM  Result Value Ref Range   Total hemoglobin 8.1 (L) 12.0 - 16.0 g/dL   O2 Saturation 51.2 %   Carboxyhemoglobin 1.2 0.5 - 1.5 %   Methemoglobin 1.4 0.0 - 1.5 %  Glucose, capillary     Status: Abnormal   Collection Time: 04/21/17  3:36 PM  Result Value Ref Range   Glucose-Capillary 108 (H) 65 - 99 mg/dL   Comment 1 Arterial Specimen   Magnesium     Status: None   Collection Time: 04/21/17  4:22 PM  Result Value Ref Range   Magnesium 1.9 1.7 - 2.4 mg/dL  CBC     Status: Abnormal   Collection Time: 04/21/17  4:22 PM  Result Value Ref Range   WBC 28.9 (H) 4.0 - 10.5 K/uL    Comment: REPEATED TO VERIFY   RBC 3.47 (L) 4.22 - 5.81 MIL/uL   Hemoglobin 8.6 (L) 13.0 - 17.0 g/dL   HCT 26.8 (L) 39.0 - 52.0 %   MCV 77.2 (L) 78.0 - 100.0 fL   MCH 24.8 (L) 26.0 - 34.0 pg   MCHC 32.1 30.0 - 36.0 g/dL   RDW 17.8 (H) 11.5 - 15.5 %   Platelets 154 150 -  400 K/uL  Creatinine, serum     Status: Abnormal   Collection Time: 04/21/17  4:22 PM  Result Value Ref Range   Creatinine, Ser 1.54 (H) 0.61 - 1.24 mg/dL   GFR calc non Af Amer 50 (L) >60 mL/min   GFR calc Af Amer 58 (L) >60 mL/min    Comment: (NOTE) The eGFR has been calculated using the CKD EPI equation. This calculation has not been validated in all clinical situations. eGFR's persistently <60 mL/min signify possible Chronic Kidney Disease.   I-STAT, chem 8     Status: Abnormal   Collection Time: 04/21/17  4:34 PM  Result Value Ref Range   Sodium 134 (L) 135 - 145 mmol/L   Potassium 4.6 3.5 - 5.1 mmol/L   Chloride 100 (L) 101 - 111 mmol/L   BUN 14 6 - 20 mg/dL   Creatinine, Ser 1.40 (H) 0.61 - 1.24 mg/dL   Glucose, Bld 104 (H) 65 - 99 mg/dL   Calcium, Ion 1.11 (L) 1.15 - 1.40 mmol/L   TCO2 22 22 - 32 mmol/L   Hemoglobin 9.5 (L) 13.0 - 17.0 g/dL   HCT 28.0 (L) 39.0 - 52.0 %  I-STAT 3, arterial blood gas (G3+)     Status: Abnormal   Collection Time: 04/21/17  4:39 PM  Result Value Ref Range   pH, Arterial 7.484 (H) 7.350 - 7.450   pCO2 arterial 27.6 (L) 32.0 - 48.0 mmHg   pO2, Arterial 63.0 (L) 83.0 - 108.0 mmHg   Bicarbonate 20.5 20.0 - 28.0 mmol/L   TCO2 21 (L) 22 - 32 mmol/L   O2 Saturation 92.0 %   Acid-base deficit 2.0 0.0 - 2.0 mmol/L   Patient temperature 38.4 C    Collection site ARTERIAL LINE    Drawn by Operator    Sample type ARTERIAL   Glucose, capillary     Status: None   Collection Time: 04/21/17  7:36 PM  Result Value Ref Range   Glucose-Capillary 78 65 - 99 mg/dL  .Cooxemetry Panel (carboxy, met, total hgb, O2 sat)     Status: Abnormal   Collection Time: 04/21/17  7:48 PM  Result Value Ref Range   Total hemoglobin 11.3 (L) 12.0 - 16.0 g/dL   O2 Saturation 43.9 %   Carboxyhemoglobin 1.2 0.5 - 1.5 %   Methemoglobin 1.3 0.0 - 1.5 %  Glucose, capillary     Status: Abnormal   Collection Time: 04/21/17 11:09 PM  Result Value Ref Range    Glucose-Capillary 110 (H) 65 - 99 mg/dL   Comment 1 Arterial Specimen   Comprehensive metabolic panel     Status: Abnormal   Collection Time: 04/22/17  3:56 AM  Result Value Ref Range   Sodium 129 (L) 135 - 145 mmol/L   Potassium  4.6 3.5 - 5.1 mmol/L   Chloride 99 (L) 101 - 111 mmol/L   CO2 19 (L) 22 - 32 mmol/L   Glucose, Bld 109 (H) 65 - 99 mg/dL   BUN 21 (H) 6 - 20 mg/dL   Creatinine, Ser 2.35 (H) 0.61 - 1.24 mg/dL   Calcium 8.3 (L) 8.9 - 10.3 mg/dL   Total Protein 5.8 (L) 6.5 - 8.1 g/dL   Albumin 2.5 (L) 3.5 - 5.0 g/dL   AST 171 (H) 15 - 41 U/L   ALT 77 (H) 17 - 63 U/L   Alkaline Phosphatase 75 38 - 126 U/L   Total Bilirubin 7.4 (H) 0.3 - 1.2 mg/dL   GFR calc non Af Amer 30 (L) >60 mL/min   GFR calc Af Amer 35 (L) >60 mL/min    Comment: (NOTE) The eGFR has been calculated using the CKD EPI equation. This calculation has not been validated in all clinical situations. eGFR's persistently <60 mL/min signify possible Chronic Kidney Disease.    Anion gap 11 5 - 15  CBC with Differential     Status: Abnormal   Collection Time: 04/22/17  3:56 AM  Result Value Ref Range   WBC 36.9 (H) 4.0 - 10.5 K/uL   RBC 3.37 (L) 4.22 - 5.81 MIL/uL   Hemoglobin 8.4 (L) 13.0 - 17.0 g/dL   HCT 25.9 (L) 39.0 - 52.0 %   MCV 76.9 (L) 78.0 - 100.0 fL   MCH 24.9 (L) 26.0 - 34.0 pg   MCHC 32.4 30.0 - 36.0 g/dL   RDW 17.9 (H) 11.5 - 15.5 %   Platelets 156 150 - 400 K/uL   Neutrophils Relative % 89 %   Lymphocytes Relative 3 %   Monocytes Relative 8 %   Eosinophils Relative 0 %   Basophils Relative 0 %   Neutro Abs 32.8 (H) 1.7 - 7.7 K/uL   Lymphs Abs 1.1 0.7 - 4.0 K/uL   Monocytes Absolute 3.0 (H) 0.1 - 1.0 K/uL   Eosinophils Absolute 0.0 0.0 - 0.7 K/uL   Basophils Absolute 0.0 0.0 - 0.1 K/uL   RBC Morphology POLYCHROMASIA PRESENT     Comment: BASOPHILIC STIPPLING ELLIPTOCYTES BURR CELLS    WBC Morphology INCREASED BANDS (>20% BANDS)   Magnesium     Status: None   Collection Time:  04/22/17  3:56 AM  Result Value Ref Range   Magnesium 1.8 1.7 - 2.4 mg/dL  Phosphorus     Status: None   Collection Time: 04/22/17  3:56 AM  Result Value Ref Range   Phosphorus 4.4 2.5 - 4.6 mg/dL  Lactate dehydrogenase     Status: Abnormal   Collection Time: 04/22/17  3:56 AM  Result Value Ref Range   LDH 576 (H) 98 - 192 U/L  Protime-INR     Status: Abnormal   Collection Time: 04/22/17  3:56 AM  Result Value Ref Range   Prothrombin Time 18.2 (H) 11.4 - 15.2 seconds   INR 1.52   .Cooxemetry Panel (carboxy, met, total hgb, O2 sat)     Status: Abnormal   Collection Time: 04/22/17  4:15 AM  Result Value Ref Range   Total hemoglobin 8.1 (L) 12.0 - 16.0 g/dL   O2 Saturation 42.4 %   Carboxyhemoglobin 1.6 (H) 0.5 - 1.5 %   Methemoglobin 1.3 0.0 - 1.5 %  I-STAT 3, arterial blood gas (G3+)     Status: Abnormal   Collection Time: 04/22/17  4:16 AM  Result Value Ref Range  pH, Arterial 7.400 7.350 - 7.450   pCO2 arterial 31.7 (L) 32.0 - 48.0 mmHg   pO2, Arterial 69.0 (L) 83.0 - 108.0 mmHg   Bicarbonate 19.5 (L) 20.0 - 28.0 mmol/L   TCO2 20 (L) 22 - 32 mmol/L   O2 Saturation 93.0 %   Acid-base deficit 4.0 (H) 0.0 - 2.0 mmol/L   Patient temperature 37.6 C    Sample type ARTERIAL   Prepare RBC     Status: None   Collection Time: 04/22/17  5:52 AM  Result Value Ref Range   Order Confirmation ORDER PROCESSED BY BLOOD BANK   Glucose, capillary     Status: Abnormal   Collection Time: 04/22/17  8:01 AM  Result Value Ref Range   Glucose-Capillary 126 (H) 65 - 99 mg/dL   Comment 1 Notify RN   .Cooxemetry Panel (carboxy, met, total hgb, O2 sat)     Status: Abnormal   Collection Time: 04/22/17  8:30 AM  Result Value Ref Range   Total hemoglobin 14.0 12.0 - 16.0 g/dL   O2 Saturation 44.2 %   Carboxyhemoglobin 1.6 (H) 0.5 - 1.5 %   Methemoglobin 0.8 0.0 - 1.5 %  Glucose, capillary     Status: Abnormal   Collection Time: 04/22/17  4:55 PM  Result Value Ref Range   Glucose-Capillary 141  (H) 65 - 99 mg/dL   Comment 1 Notify RN   I-STAT 4, (NA,K, GLUC, HGB,HCT)     Status: Abnormal   Collection Time: 04/22/17  5:01 PM  Result Value Ref Range   Sodium 132 (L) 135 - 145 mmol/L   Potassium 4.0 3.5 - 5.1 mmol/L   Glucose, Bld 137 (H) 65 - 99 mg/dL   HCT 30.0 (L) 39.0 - 52.0 %   Hemoglobin 10.2 (L) 13.0 - 17.0 g/dL  Glucose, capillary     Status: Abnormal   Collection Time: 04/22/17  8:18 PM  Result Value Ref Range   Glucose-Capillary 137 (H) 65 - 99 mg/dL   Comment 1 Notify RN   Glucose, capillary     Status: Abnormal   Collection Time: 04/22/17 11:41 PM  Result Value Ref Range   Glucose-Capillary 121 (H) 65 - 99 mg/dL   Comment 1 Notify RN   Comprehensive metabolic panel     Status: Abnormal   Collection Time: 04/23/17  3:36 AM  Result Value Ref Range   Sodium 126 (L) 135 - 145 mmol/L   Potassium 3.8 3.5 - 5.1 mmol/L   Chloride 95 (L) 101 - 111 mmol/L   CO2 19 (L) 22 - 32 mmol/L   Glucose, Bld 129 (H) 65 - 99 mg/dL   BUN 36 (H) 6 - 20 mg/dL   Creatinine, Ser 3.34 (H) 0.61 - 1.24 mg/dL   Calcium 7.8 (L) 8.9 - 10.3 mg/dL   Total Protein 5.7 (L) 6.5 - 8.1 g/dL   Albumin 2.2 (L) 3.5 - 5.0 g/dL   AST 319 (H) 15 - 41 U/L   ALT 208 (H) 17 - 63 U/L   Alkaline Phosphatase 95 38 - 126 U/L   Total Bilirubin 9.6 (H) 0.3 - 1.2 mg/dL   GFR calc non Af Amer 20 (L) >60 mL/min   GFR calc Af Amer 23 (L) >60 mL/min    Comment: (NOTE) The eGFR has been calculated using the CKD EPI equation. This calculation has not been validated in all clinical situations. eGFR's persistently <60 mL/min signify possible Chronic Kidney Disease.    Anion gap 12  5 - 15  CBC with Differential     Status: Abnormal   Collection Time: 04/23/17  3:36 AM  Result Value Ref Range   WBC 34.7 (H) 4.0 - 10.5 K/uL    Comment: REPEATED TO VERIFY   RBC 3.43 (L) 4.22 - 5.81 MIL/uL   Hemoglobin 8.8 (L) 13.0 - 17.0 g/dL   HCT 26.4 (L) 39.0 - 52.0 %   MCV 77.0 (L) 78.0 - 100.0 fL   MCH 25.7 (L) 26.0 -  34.0 pg   MCHC 33.3 30.0 - 36.0 g/dL   RDW 17.7 (H) 11.5 - 15.5 %   Platelets 143 (L) 150 - 400 K/uL   Neutrophils Relative % 92 %   Lymphocytes Relative 4 %   Monocytes Relative 4 %   Eosinophils Relative 0 %   Basophils Relative 0 %   Neutro Abs 31.9 (H) 1.7 - 7.7 K/uL   Lymphs Abs 1.4 0.7 - 4.0 K/uL   Monocytes Absolute 1.4 (H) 0.1 - 1.0 K/uL   Eosinophils Absolute 0.0 0.0 - 0.7 K/uL   Basophils Absolute 0.0 0.0 - 0.1 K/uL   RBC Morphology POLYCHROMASIA PRESENT     Comment: BURR CELLS RARE NRBCs   Magnesium     Status: Abnormal   Collection Time: 04/23/17  3:36 AM  Result Value Ref Range   Magnesium 2.5 (H) 1.7 - 2.4 mg/dL  Phosphorus     Status: Abnormal   Collection Time: 04/23/17  3:36 AM  Result Value Ref Range   Phosphorus 5.5 (H) 2.5 - 4.6 mg/dL  Lactate dehydrogenase     Status: Abnormal   Collection Time: 04/23/17  3:36 AM  Result Value Ref Range   LDH 923 (H) 98 - 192 U/L  Protime-INR     Status: Abnormal   Collection Time: 04/23/17  3:36 AM  Result Value Ref Range   Prothrombin Time 20.1 (H) 11.4 - 15.2 seconds   INR 1.73   Glucose, capillary     Status: Abnormal   Collection Time: 04/23/17  3:41 AM  Result Value Ref Range   Glucose-Capillary 120 (H) 65 - 99 mg/dL  I-STAT 3, arterial blood gas (G3+)     Status: Abnormal   Collection Time: 04/23/17  3:50 AM  Result Value Ref Range   pH, Arterial 7.348 (L) 7.350 - 7.450   pCO2 arterial 33.8 32.0 - 48.0 mmHg   pO2, Arterial 36.0 (LL) 83.0 - 108.0 mmHg   Bicarbonate 18.6 (L) 20.0 - 28.0 mmol/L   TCO2 20 (L) 22 - 32 mmol/L   O2 Saturation 67.0 %   Acid-base deficit 6.0 (H) 0.0 - 2.0 mmol/L   Patient temperature 37.1 C    Collection site ARTERIAL LINE    Drawn by Operator    Sample type ARTERIAL    Comment MD NOTIFIED, REPEAT TEST   Cooxemetry Panel (carboxy, met, total hgb, O2 sat)     Status: Abnormal   Collection Time: 04/23/17  3:55 AM  Result Value Ref Range   Total hemoglobin 16.9 (H) 12.0 - 16.0  g/dL   O2 Saturation 20.0 %   Carboxyhemoglobin 1.3 0.5 - 1.5 %   Methemoglobin 0.8 0.0 - 1.5 %  Blood gas, arterial     Status: Abnormal   Collection Time: 04/23/17  4:01 AM  Result Value Ref Range   O2 Content 6.0 L/min   Delivery systems NASAL CANNULA    pH, Arterial 7.291 (L) 7.350 - 7.450   pCO2 arterial 37.2 32.0 -  48.0 mmHg   pO2, Arterial 42.0 (L) 83.0 - 108.0 mmHg   Bicarbonate 17.3 (L) 20.0 - 28.0 mmol/L   Acid-base deficit 8.0 (H) 0.0 - 2.0 mmol/L   O2 Saturation 64.4 %   Patient temperature 98.6    Nitric Oxide 5.2    Collection site RIGHT RADIAL    Drawn by [id number]    Sample type ARTERIAL DRAW    Allens test (pass/fail) PASS PASS  I-STAT 3, arterial blood gas (G3+)     Status: Abnormal   Collection Time: 04/23/17  4:33 AM  Result Value Ref Range   pH, Arterial 7.313 (L) 7.350 - 7.450   pCO2 arterial 36.9 32.0 - 48.0 mmHg   pO2, Arterial 70.0 (L) 83.0 - 108.0 mmHg   Bicarbonate 18.7 (L) 20.0 - 28.0 mmol/L   TCO2 20 (L) 22 - 32 mmol/L   O2 Saturation 92.0 %   Acid-base deficit 7.0 (H) 0.0 - 2.0 mmol/L   Patient temperature 37.3 C    Collection site ARTERIAL LINE    Drawn by Operator    Sample type ARTERIAL   .Cooxemetry Panel (carboxy, met, total hgb, O2 sat)     Status: Abnormal   Collection Time: 04/23/17  5:25 AM  Result Value Ref Range   Total hemoglobin 9.3 (L) 12.0 - 16.0 g/dL   O2 Saturation 29.1 %   Carboxyhemoglobin 1.7 (H) 0.5 - 1.5 %   Methemoglobin 1.1 0.0 - 1.5 %  I-STAT 3, arterial blood gas (G3+)     Status: Abnormal   Collection Time: 04/23/17  5:32 AM  Result Value Ref Range   pH, Arterial 7.377 7.350 - 7.450   pCO2 arterial 33.8 32.0 - 48.0 mmHg   pO2, Arterial 45.0 (L) 83.0 - 108.0 mmHg   Bicarbonate 19.9 (L) 20.0 - 28.0 mmol/L   TCO2 21 (L) 22 - 32 mmol/L   O2 Saturation 80.0 %   Acid-base deficit 5.0 (H) 0.0 - 2.0 mmol/L   Patient temperature 37.1 C    Collection site ARTERIAL LINE    Drawn by Operator    Sample type ARTERIAL    I-STAT 3, arterial blood gas (G3+)     Status: Abnormal   Collection Time: 04/23/17  7:40 AM  Result Value Ref Range   pH, Arterial 7.335 (L) 7.350 - 7.450   pCO2 arterial 39.0 32.0 - 48.0 mmHg   pO2, Arterial 74.0 (L) 83.0 - 108.0 mmHg   Bicarbonate 20.8 20.0 - 28.0 mmol/L   TCO2 22 22 - 32 mmol/L   O2 Saturation 94.0 %   Acid-base deficit 5.0 (H) 0.0 - 2.0 mmol/L   Patient temperature 36.9 C    Sample type ARTERIAL    Dg Chest Port 1 View  Result Date: 04/22/2017 CLINICAL DATA:  Pneumonia EXAM: PORTABLE CHEST 1 VIEW COMPARISON:  April 21, 2017 FINDINGS: The heart size and mediastinal contours are stable. Swan-Ganz catheter is unchanged. Left chest tube and mediastinal drain are unchanged. Cardiac pacemaker is stable. Previously noted endotracheal tube is removed. The heart size is enlarged. There is pulmonary edema. Patchy consolidation of left lung base is unchanged. The visualized skeletal structures are stable. IMPRESSION: Pulmonary edema.  Patchy consolidation of left lung base unchanged. Electronically Signed   By: Abelardo Diesel M.D.   On: 04/22/2017 10:28    ROS: all other systems reviewed and are negative except per HPI  Blood pressure (!) 81/66, pulse 85, temperature 98.4 F (36.9 C), temperature source Core, resp. rate Marland Kitchen)  27, height '5\' 5"'$  (1.651 m), weight 76 kg (167 lb 8.8 oz), SpO2 93 %. Physical Exam  GEN ill-appearing, in some distress HEENT eyes closed, BiPaP in place NECK + JVD to angle of mandible, R IJ Swann in place CHEST: L sided EP pocket well-healed PULM increased WOB, some crackles  CV mechanical hum of LVAD heard throughout precordium ABD mildly distended, hypoactive BS EXT no LE edema, 2+ UE edema.  Not particularly cold extremities NEURO AAO x 3 SKIN no rashes/ lesions  Assessment/Plan  1.  Acute oliguric kidney injury superimposed on CKD Stage III: This is likely due to a hemodynamic hypoperfusion injury.  Doesn't appear that pt is hemolyzing so  pigment nephropathy less likely; will confirm with UA.  There have been no VAD clotting issues so I do not suspect embolic renal infarct.  He is clearly vol overloaded and needs correction of this for optimal LVAD functioning.  Have discussed case with Dr. Cyndia Bent.  Will begin CRRT.  I have had a detailed discussion with the patient regarding risks/ benefits of CRRT and he is willing to proceed.  I am hopeful this will be temporary and he will not require dialysis long-term.  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  2.  ICM s/p VAD with cardiogenic shock: 12/27 with insertion of Heartmate III.  Pressor requirements are increasing.  Also on inhaled NO.  Per CT surgery.  3.  Acute hypoxic RF: expect to improve somewhat with vol removal. Pulm edema on CXR  4.  Leukocytosis: on empiric vanc/ cefepime given WBC 35 and increasing pressor requirements.    5.  Shock liver:  Congestive hepatopathy +/- hypoperfusion injury.  Madelon Lips, MD [facility] [facility] pgr [phone] 04/23/2017, 8:21 AM

## 2017-04-23 NOTE — Progress Notes (Signed)
Patient's doppler MAP has decreased, had second RN verify doppler MAP.  Arterial line does not consistently provide BP readings.  Patient's core temperature decreased, verified temperature via temp foley but patient is diaphoretic and warm to touch.  Patient less responsive to RN, patient able to answer orientation questions but has some delay and is not as alert as patient previously was.  Called MD Bartle, new orders received and MD advised he was on his way to bedside.

## 2017-04-23 NOTE — Progress Notes (Signed)
Dr. Cyndia Bent updated with various labs including critical ABG, orders received for Bipap and to minimize fluid intake

## 2017-04-23 NOTE — Progress Notes (Signed)
0400 Regarding Critical ABG results:  patient had sleeping comfortably for an hour but easily arousable, no complaints, woken, sat upright, oxygen tubing checked, pt AOx4 without complaint except mild SOB, pt coughed and deep breathed, Resp called to verify ABG by sticking patient  0430 Repeat ABG drawn and results much improved

## 2017-04-23 NOTE — Progress Notes (Signed)
Pharmacy Antibiotic Note  David [name] is a 51 y.o. male admitted on 04/19/2017 now s/p LVAD on 12/27. Pt given Zinacef and vancomycin x48 hr for surgical prophylaxis, now to start on cefepime per pharmacy with rising WBC and concern for infection. Of note, CRRT to begin today for worsening renal function.  Plan: -Cefepime 2g IV x1 now  -Start cefepime 2g IV q12h once CRRT started -Monitor cultures, progression of renal function  Height: 5\' 5"  (165.1 cm) Weight: 167 lb 8.8 oz (76 kg) IBW/kg (Calculated) : 61.5  Temp (24hrs), Avg:98.6 F (37 C), Min:97.7 F (36.5 C), Max:99.1 F (37.3 C)  Recent Labs  Lab 04/20/17 1935  04/21/17 0259 04/21/17 1622 04/21/17 1634 04/22/17 0356 04/23/17 0336  WBC 20.3*  --  18.5* 28.9*  --  36.9* 34.7*  CREATININE 1.60*   < > 1.60* 1.54* 1.40* 2.35* 3.34*   < > = values in this interval not displayed.    Estimated Creatinine Clearance: 24.3 mL/min (A) (by C-G formula based on SCr of 3.34 mg/dL (H)).    Allergies  Allergen Reactions  . Plavix [Clopidogrel Bisulfate] Hives    Antimicrobials this admission: Zinacef 12/27 > 12/29 Vancomycin 12/27 > 12/28 Rifampin 12/27 > 12/28 Cefepime 12/30 >>  Dose adjustments this admission: none  Microbiology results: 12/30 BCx: pending 12/26 MRSA PCR: negative  Thank you for allowing pharmacy to be a part of this patient's care.  Arrie Senate, PharmD, BCPS PGY-2 Cardiology Pharmacy Resident Pager: [phone] 04/23/2017

## 2017-04-23 NOTE — Progress Notes (Signed)
Patient with low PO2 per abg on 6L nasal canulla with NO.  Patient placed on NIV 12/6, 100% Fi02(to be weaned per abgs due to poor Sp02 readings) with NO through NIV, same settings as before.  Patient tolerating well at this time, minimal leak with large FFM.  NO2 0.1, NO 4.5.  RN at bedside, will continue to monitor.

## 2017-04-23 NOTE — Anesthesia Procedure Notes (Signed)
Procedure Name: Intubation Date/Time: 04/23/2017 5:06 PM Performed by: Imagene Riches, CRNA Pre-anesthesia Checklist: Patient identified, Emergency Drugs available, Suction available and Patient being monitored Patient Re-evaluated:Patient Re-evaluated prior to induction Oxygen Delivery Method: Ambu bag Preoxygenation: Pre-oxygenation with 100% oxygen Induction Type: IV induction Ventilation: Mask ventilation without difficulty Laryngoscope Size: Glidescope and 4 Grade View: Grade I Tube type: Oral Tube size: 8.0 mm Number of attempts: 1 Airway Equipment and Method: Stylet and Oral airway Placement Confirmation: ETT inserted through vocal cords under direct vision,  positive ETCO2 and breath sounds checked- equal and bilateral Secured at: 22 cm Tube secured with: Tape Dental Injury: Teeth and Oropharynx as per pre-operative assessment

## 2017-04-23 NOTE — Progress Notes (Signed)
MD Bartle advised no coumadin to be given this evening as patient NPO/intubated.

## 2017-04-23 NOTE — Progress Notes (Addendum)
Patient ID: David [name], male   DOB: February 08, 1966, 51 y.o.   MRN: [id number] TCTS Evening Rounds:  He remains critically ill with multisystem organ failure. CRRT started today He has required escalating vasopressor support to maintain MAP and now on levophed 30, epi 8, milrinone 0.375 with MAP only 60's. New femoral arterial line inserted.  He was less responsive this afternoon on bipap and ABG showed hypercarbia so he was intubated.   Pump flow has been over 5L. PI dropped to 1.8 after extubation. I decreased speed to 5300.   PA is 45/20 with CVP 20. CI 2.4 Ideally would like to get some volume off but hypotension has made this difficult.  He is also hypothermic this afternoon to 34.6 and Bear Hugger applied.   Adding vasopressin 0.03 for BP support.   Clinical status and plans discussed with wife at bedside.

## 2017-04-23 NOTE — Procedures (Signed)
Intubation Procedure Note David [name] [id number] June 11, 1965  Procedure: Intubation Indications: Airway protection and maintenance  Procedure Details Consent: Unable to obtain consent because of emergent medical necessity. Time Out: Verified patient identification, verified procedure, site/side was marked, verified correct patient position, special equipment/implants available, medications/allergies/relevent history reviewed, required imaging and test results available.  Performed  Maximum sterile technique was used including gloves, hand hygiene and mask.  MAC and 4    Evaluation Hemodynamic Status: Transient hypotension treated with pressors; O2 sats: currently acceptable Patient's Current Condition: stable Complications: No apparent complications Patient did tolerate procedure well. Chest X-ray ordered to verify placement.  CXR: pending.   David [name], David [name] 04/23/2017

## 2017-04-23 NOTE — Plan of Care (Signed)
  Progressing Education: Knowledge of General Education information will improve 04/23/2017 2339 - Progressing by Reinaldo Berber, RN Cardiac: Ability to maintain an adequate cardiac output will improve 04/23/2017 2339 - Progressing by Reinaldo Berber, RN Coping: Level of anxiety will decrease 04/23/2017 2339 - Progressing by Reinaldo Berber, RN Note Lots of emotional support needed today Physical Regulation: Will remain free from infection 04/23/2017 2339 - Progressing by Reinaldo Berber, RN Note Incision healing well Skin Integrity: Wound healing without signs and symptoms of infection 04/23/2017 2339 - Progressing by Reinaldo Berber, RN Respiratory: Ability to maintain a clear airway and adequate ventilation will improve 04/23/2017 2339 - Progressing by Reinaldo Berber, RN Note ABGs improved since intubation Role Relationship: Method of communication will improve 04/23/2017 2339 - Progressing by Reinaldo Berber, RN Note Pt is calm, nods appropriately, follows commands, nods yes to comfortable

## 2017-04-23 NOTE — CV Procedure (Signed)
Insertion of right femoral arterial line:  Sterile prep and drape.  1% lidocaine A femoral arterial catheter was inserted into the right femoral artery using Seldinger technique. MAP 60's on monitor when connected.  Sterile dressing applied.

## 2017-04-23 NOTE — Progress Notes (Signed)
Patient ID: David [name], male   DOB: 1965-10-17, 51 y.o.   MRN: [id number]   Advanced Heart Failure VAD Team Note  Subjective:    David [name] is a 51 y.o. male with a PMH of ICM ('09 PCI to LAD, mRCA, '10 PCI to Lcx and '12 mRCA), HFrEF (EF ~20% for >5 years), HLD and HTN admitted 12/26 for VAD placement due to end-stage systolic HF.   Underwent successful HM-3 LVAD placement 04/20/17.  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   Extubated 12/28.  Epinephrine stopped but then UOP and co-ox dropped so restarted.  MAP lower when sitting in chair yesterday so norepinephrine restarted 12/29.  Poor UOP despite Lasix gtt and creatinine continues to rise, now up to 3.34.  CVP 19.  Cardiac index good off Swan.  Co-ox this morning very low but in setting of hypoxemia (likely from pulmonary edema).  Now on Bipap with improved ABG.   He remains afebrile but WBCs still 35.  CXR with pulmonary edema.   LFTs elevated with tbili 9.7.   Swan numbers reviewed CVP 19 PA 49/16 CI 3.5  LVAD INTERROGATION:  HeartMate III LVAD:  Flow 5 liters/min, speed 5400, power 3.2, PI 4. No PI events.   Objective:    Vital Signs:   Temp:  [97.7 F (36.5 C)-99.1 F (37.3 C)] 99 F (37.2 C) (12/30 0600) Pulse Rate:  [30-112] 82 (12/29 2000) Resp:  [20-36] 24 (12/30 0600) BP: (86-108)/(42-92) 104/73 (12/29 2100) SpO2:  [90 %-96 %] 91 % (12/30 0300) Arterial Line BP: (59-106)/(49-83) 75/61 (12/30 0600) FiO2 (%):  [100 %] 100 % (12/30 0619) Weight:  [167 lb 8.8 oz (76 kg)] 167 lb 8.8 oz (76 kg) (12/30 0600) Last BM Date: 04/18/17 Mean arterial Pressure 80s  Intake/Output:   Intake/Output Summary (Last 24 hours) at 04/23/2017 0758 Last data filed at 04/23/2017 0700 Gross per 24 hour  Intake 4111.2 ml  Output 1030 ml  Net 3081.2 ml     Physical Exam    GENERAL: Bipap, sleeping.  HEENT: Normal. NECK: Right IJ swan, JVP 12+ cm.  CARDIAC:  Mechanical heart sounds with LVAD hum present.    LUNGS:  Dependent crackles  ABDOMEN:  NT, ND, no HSM. No bruits or masses. +BS  LVAD exit site: Well-healed and incorporated. Dressing dry and intact. No erythema or drainage. Stabilization device present and accurately applied. Driveline dressing changed daily per sterile technique. EXTREMITIES:  Warm and dry. No cyanosis, clubbing, rash, or edema.  NEUROLOGIC: Sleeping on Bipap.  Moves all 4 extremities w/o difficulty.    Telemetry   Suspect atrial fibrillation, rate 80s (personally reviewed).   Labs   Basic Metabolic Panel: Recent Labs  Lab 04/19/17 1928  04/20/17 1416 04/20/17 1935 04/20/17 1953 04/21/17 0259 04/21/17 1622 04/21/17 1634 04/22/17 0356 04/22/17 1701 04/23/17 0336  NA 134*   < > 135  --  135 134*  --  134* 129* 132* 126*  K 3.2*   < > 3.5  --  5.1 4.5  --  4.6 4.6 4.0 3.8  CL 101   < > 103  --  103 103  --  100* 99*  --  95*  CO2 27  --  24  --   --  21*  --   --  19*  --  19*  GLUCOSE 110*   < > 143*  --  168* 188*  --  104* 109* 137* 129*  BUN 14   < >  10  --  11 13  --  14 21*  --  36*  CREATININE 1.48*   < > 1.29* 1.60* 1.30* 1.60* 1.54* 1.40* 2.35*  --  3.34*  CALCIUM 8.3*  --  8.2*  --   --  8.2*  --   --  8.3*  --  7.8*  MG  --    < > 1.9 3.1*  --  2.3 1.9  --  1.8  --  2.5*  PHOS  --   --   --   --   --  3.1  --   --  4.4  --  5.5*   < > = values in this interval not displayed.    Liver Function Tests: Recent Labs  Lab 04/19/17 1928 04/21/17 0259 04/22/17 0356 04/23/17 0336  AST 20 95* 171* 319*  ALT 12* 19 77* 208*  ALKPHOS 100 61 75 95  BILITOT 1.1 4.0* 7.4* 9.6*  PROT 6.5 5.6* 5.8* 5.7*  ALBUMIN 2.3* 3.0* 2.5* 2.2*   No results for input(s): LIPASE, AMYLASE in the last 168 hours. No results for input(s): AMMONIA in the last 168 hours.  CBC: Recent Labs  Lab 04/20/17 1935  04/21/17 0259 04/21/17 1622 04/21/17 1634 04/22/17 0356 04/22/17 1701 04/23/17 0336  WBC 20.3*  --  18.5* 28.9*  --  36.9*  --  34.7*  NEUTROABS  --    --  15.5*  --   --  32.8*  --  31.9*  HGB 9.6*   < > 8.9* 8.6* 9.5* 8.4* 10.2* 8.8*  HCT 30.5*   < > 27.6* 26.8* 28.0* 25.9* 30.0* 26.4*  MCV 78.4  --  77.3* 77.2*  --  76.9*  --  77.0*  PLT 194  --  177 154  --  156  --  143*   < > = values in this interval not displayed.    INR: Recent Labs  Lab 04/19/17 1337 04/20/17 1416 04/21/17 0259 04/22/17 0356 04/23/17 0336  INR 1.20 1.42  1.44 1.34 1.52 1.73    Other results:  Imaging   Dg Chest Port 1 View  Result Date: 04/22/2017 CLINICAL DATA:  Pneumonia EXAM: PORTABLE CHEST 1 VIEW COMPARISON:  April 21, 2017 FINDINGS: The heart size and mediastinal contours are stable. Swan-Ganz catheter is unchanged. Left chest tube and mediastinal drain are unchanged. Cardiac pacemaker is stable. Previously noted endotracheal tube is removed. The heart size is enlarged. There is pulmonary edema. Patchy consolidation of left lung base is unchanged. The visualized skeletal structures are stable. IMPRESSION: Pulmonary edema.  Patchy consolidation of left lung base unchanged. Electronically Signed   By: Abelardo Diesel M.D.   On: 04/22/2017 10:28     Medications:     Scheduled Medications: . amiodarone  200 mg Oral BID  . aspirin EC  325 mg Oral Daily  . bisacodyl  10 mg Oral Daily  . Chlorhexidine Gluconate Cloth  6 each Topical Daily  . docusate sodium  200 mg Oral Daily  . insulin aspart  0-24 Units Subcutaneous Q4H  . levalbuterol  1.25 mg Nebulization Q6H  . mouth rinse  15 mL Mouth Rinse BID  . pantoprazole  40 mg Oral Daily  . sodium chloride flush  10-40 mL Intracatheter Q12H  . sodium chloride flush  3 mL Intravenous Q12H  . warfarin  1 mg Oral q1800  . Warfarin - Physician Dosing Inpatient   Does not apply q1800    Infusions: . sodium  chloride 10 mL/hr at 04/23/17 0700  . sodium chloride 20 mL/hr at 04/23/17 0700  . EPINEPHrine 4 mg in dextrose 5% 250 mL infusion (16 mcg/mL) 5 mcg/min (04/23/17 0700)  . furosemide (LASIX)  infusion 8 mg/hr (04/23/17 0700)  . lactated ringers Stopped (04/21/17 1932)  . lactated ringers 10 mL/hr at 04/23/17 0700  . milrinone 0.375 mcg/kg/min (04/23/17 0700)  . norepinephrine (LEVOPHED) Adult infusion 10 mcg/min (04/23/17 0700)    PRN Medications: sodium chloride, linaclotide, morphine injection, ondansetron (ZOFRAN) IV, oxyCODONE, sodium chloride flush, sodium chloride flush, traMADol   Patient Profile   Darrious Youman is a 51 y.o. male with a PMH of ICM ('09 PCI to LAD, mRCA, '10 PCI to Lcx and '12 mRCA), HFrEF (EF ~20% for >5 years) s/p HM-III VAD placement on 12/27 due to end-stage systolic HF.   Assessment/Plan:    1. Acute on chronic systolic HF: Echo 16/10/96 with LVEF 20-25%, Mild MR, Mild/Mod MR, Severe LAE, Mildly reduced RV, PA peak pressure 56 mm Hg.  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 Extubated, initially stopped epinephrine/norepinephrine but restarted with fall in UOP and MAP.  This morning, on NO 4, milrinone 0.375, norepinephrine 10, and epinephrine 5.  RV looked ok in OR per Dr. Cyndia Bent.  CI still good at 3.5.  Now, poor UOP with AKI and pulmonary edema requiring Bipap, CVP rising (19 today).  Suspect he developed AKI with peri-operative hypotension.  MAP currently low 70s.  - Continue current milrinone.  - With rising creatinine and poor UOP despite Lasix gtt as well as pulmonary edema now on Bipap will need CRRT.  Discussed with Dr. Cyndia Bent and nephrology consulted.  - Would titrate norepinephrine to keep MAP closer to 80. Will likely need to go higher to allow fluid off with CRRT.  - Stop Lasix when CRRT begins.  - Continue ASA 325 until INR > 2.  - Warfarin goal INR 2-2.5.  2. CAD s/p PCI to mid RCA and PLOM with DES x 2 and DES to mid LAD in 1/18.   - Continue ASA and statin  3. PAF: Suspect atrial fibrillation but rate is controlled. He is on po amiodarone but do not think this caused rise in LFTs.   4. AKI on CKD  stage III: Creatinine up to 3.3 with poor UOP despite Lasix gtt. Suspect some degree of intrinsic renal disease prior to LVAD placement (baseline creatinine around 1.5) with probable ATN from intra-op/peri-op hypotension.   - See discussion above, will need CRRT. Nephrology to see.  - Maintain MAP with titration of norepinephrine.  5. ABLA, expected: Appropriate increase hgb with 1 unit 12/29.  6. ID: WBCs still high at 35, afebrile. Cultures sent and pending.  Discussed with Dr. Cyndia Bent, think inflammatory most likely but given increasing pressor requirement will cover empirically with cefepime.  Holding off on vancomycin with renal failure and no supporting culture data.  7. Elevated LFTs: tbili to 9.6.  Suspect probably a degree of shock liver peri-operatively as well as passive congestion with rising CVP.  Should improve with CVVH/volume removal.   CRITICAL CARE Performed by: Loralie Champagne  Total critical care time: 45 minutes  Critical care time was exclusive of separately billable procedures and treating other patients.  Critical care was necessary to treat or prevent imminent or life-threatening deterioration.  Critical care was time spent personally by me on the following activities: development of treatment plan with patient and/or surrogate as well as  nursing, discussions with consultants, evaluation of patient's response to treatment, examination of patient, obtaining history from patient or surrogate, ordering and performing treatments and interventions, ordering and review of laboratory studies, ordering and review of radiographic studies, pulse oximetry and re-evaluation of patient's condition.  Length of Stay: 4  Loralie Champagne, MD 04/23/2017, 7:58 AM  VAD Team --- VAD ISSUES ONLY--- Pager [phone] (7am - 7am)  Advanced Heart Failure Team  Pager [phone] (M-F; 7a - 4p)  Please contact [facility] for night-coverage after hours (4p -7a ) and weekends on [email or web address]

## 2017-04-23 NOTE — Progress Notes (Signed)
Patient ID: David [name], male   DOB: 05-Jan-1966, 51 y.o.   MRN: [id number] HeartMate 3 Rounding Note  Subjective:    Breathing better on bipap. ABG this am showed sat of 80% with PO2 of 45. Placed on bipap with improvement of PO2 to 74 with sat 94%. Had to stop NO on bipap. Co-ox this am only 30% with low arterial sat.  Remains on Milrinone 0.375, epi 5, levo 10 with CI 3.5. LVAD parameters have been very stable.  Has remained oliguric on lasix drip with rise in CVP to 18-19 and rise in PA pressures.  Creatinine up to 3.34 this am.   LVAD INTERROGATION:  HeartMate IIl LVAD:  Flow 5.0 liters/min, speed 5400, power 4, PI 3.3.  No PI events  Objective:    Vital Signs:   Temp:  [97.7 F (36.5 C)-99.1 F (37.3 C)] 99 F (37.2 C) (12/30 0600) Pulse Rate:  [30-112] 85 (12/30 0803) Resp:  [20-32] 27 (12/30 0803) BP: (81-108)/(42-92) 81/66 (12/30 0803) SpO2:  [90 %-95 %] 91 % (12/30 0300) Arterial Line BP: (59-106)/(49-83) 75/61 (12/30 0600) FiO2 (%):  [100 %] 100 % (12/30 0804) Weight:  [76 kg (167 lb 8.8 oz)] 76 kg (167 lb 8.8 oz) (12/30 0600) Last BM Date: 04/18/17 Mean arterial Pressure 70's  Intake/Output:   Intake/Output Summary (Last 24 hours) at 04/23/2017 0809 Last data filed at 04/23/2017 0700 Gross per 24 hour  Intake 4030.8 ml  Output 1030 ml  Net 3000.8 ml     Physical Exam: General:  Chronically ill appearing on bipap. No resp difficulty. RR decreased from 30's to 19 on bipap. Jaundiced HEENT: facial ecchymosis unchanged  Cor:  LVAD hum present. Lungs: bilateral rales Abdomen: soft, nontender, nondistended. Hypoactive bowel sounds. Extremities: moderate anasarca Neuro: alert & orientedx3,  moves all 4 extremities w/o difficulty. Affect pleasant  Telemetry: atrial fib 90  Labs: Basic Metabolic Panel: Recent Labs  Lab 04/19/17 1928  04/20/17 1416 04/20/17 1935 04/20/17 1953 04/21/17 0259 04/21/17 1622 04/21/17 1634 04/22/17 0356 04/22/17 1701  04/23/17 0336  NA 134*   < > 135  --  135 134*  --  134* 129* 132* 126*  K 3.2*   < > 3.5  --  5.1 4.5  --  4.6 4.6 4.0 3.8  CL 101   < > 103  --  103 103  --  100* 99*  --  95*  CO2 27  --  24  --   --  21*  --   --  19*  --  19*  GLUCOSE 110*   < > 143*  --  168* 188*  --  104* 109* 137* 129*  BUN 14   < > 10  --  11 13  --  14 21*  --  36*  CREATININE 1.48*   < > 1.29* 1.60* 1.30* 1.60* 1.54* 1.40* 2.35*  --  3.34*  CALCIUM 8.3*  --  8.2*  --   --  8.2*  --   --  8.3*  --  7.8*  MG  --    < > 1.9 3.1*  --  2.3 1.9  --  1.8  --  2.5*  PHOS  --   --   --   --   --  3.1  --   --  4.4  --  5.5*   < > = values in this interval not displayed.    Liver Function Tests: Recent Labs  Lab 04/19/17  1928 04/21/17 0259 04/22/17 0356 04/23/17 0336  AST 20 95* 171* 319*  ALT 12* 19 77* 208*  ALKPHOS 100 61 75 95  BILITOT 1.1 4.0* 7.4* 9.6*  PROT 6.5 5.6* 5.8* 5.7*  ALBUMIN 2.3* 3.0* 2.5* 2.2*   No results for input(s): LIPASE, AMYLASE in the last 168 hours. No results for input(s): AMMONIA in the last 168 hours.  CBC: Recent Labs  Lab 04/20/17 1935  04/21/17 0259 04/21/17 1622 04/21/17 1634 04/22/17 0356 04/22/17 1701 04/23/17 0336  WBC 20.3*  --  18.5* 28.9*  --  36.9*  --  34.7*  NEUTROABS  --   --  15.5*  --   --  32.8*  --  31.9*  HGB 9.6*   < > 8.9* 8.6* 9.5* 8.4* 10.2* 8.8*  HCT 30.5*   < > 27.6* 26.8* 28.0* 25.9* 30.0* 26.4*  MCV 78.4  --  77.3* 77.2*  --  76.9*  --  77.0*  PLT 194  --  177 154  --  156  --  143*   < > = values in this interval not displayed.    INR: Recent Labs  Lab 04/19/17 1337 04/20/17 1416 04/21/17 0259 04/22/17 0356 04/23/17 0336  INR 1.20 1.42  1.44 1.34 1.52 1.73    Other results:  EKG:   Imaging: Dg Chest Port 1 View  Result Date: 04/22/2017 CLINICAL DATA:  Pneumonia EXAM: PORTABLE CHEST 1 VIEW COMPARISON:  April 21, 2017 FINDINGS: The heart size and mediastinal contours are stable. Swan-Ganz catheter is unchanged. Left  chest tube and mediastinal drain are unchanged. Cardiac pacemaker is stable. Previously noted endotracheal tube is removed. The heart size is enlarged. There is pulmonary edema. Patchy consolidation of left lung base is unchanged. The visualized skeletal structures are stable. IMPRESSION: Pulmonary edema.  Patchy consolidation of left lung base unchanged. Electronically Signed   By: Abelardo Diesel M.D.   On: 04/22/2017 10:28      Medications:     Scheduled Medications: . aspirin EC  325 mg Oral Daily  . bisacodyl  10 mg Oral Daily  . Chlorhexidine Gluconate Cloth  6 each Topical Daily  . docusate sodium  200 mg Oral Daily  . insulin aspart  0-24 Units Subcutaneous Q4H  . levalbuterol  1.25 mg Nebulization Q6H  . mouth rinse  15 mL Mouth Rinse BID  . pantoprazole  40 mg Oral Daily  . sodium chloride flush  10-40 mL Intracatheter Q12H  . sodium chloride flush  3 mL Intravenous Q12H  . Warfarin - Physician Dosing Inpatient   Does not apply q1800     Infusions: . sodium chloride 10 mL/hr at 04/23/17 0700  . sodium chloride 20 mL/hr at 04/23/17 0700  . ceFEPime (MAXIPIME) IV    . EPINEPHrine 4 mg in dextrose 5% 250 mL infusion (16 mcg/mL) 5 mcg/min (04/23/17 0700)  . furosemide (LASIX) infusion 8 mg/hr (04/23/17 0700)  . lactated ringers Stopped (04/21/17 1932)  . lactated ringers 10 mL/hr at 04/23/17 0700  . milrinone 0.375 mcg/kg/min (04/23/17 0700)  . norepinephrine (LEVOPHED) Adult infusion 10 mcg/min (04/23/17 0700)     PRN Medications:  sodium chloride, linaclotide, morphine injection, ondansetron (ZOFRAN) IV, oxyCODONE, sodium chloride flush, sodium chloride flush, traMADol    Assessment/Plan/Discussion:    1. Ischemic cardiomyopathy s/p multiple PCI's with chronic systolic heart failure and EF 20% on home dobutamine preop with significant improvement in symptoms. Evaluated at [facility] for heart transplant but LVAD felt to be the  best treatment for him at this time. POD 3  s/p HeartMate III LVAD. 2. Hypertension 3. Hyperlipidemia 4. PAF in sinus preop on amiodarone and Eliquis. Now back in atrial fib. 5. Stage III CKD. Acute on chronic postop renal failure with rise in creat to 3.3 this am with oliguria on lasix drip. This may be due to pump run, non-pulsatile pump flow with medical renal disease. His CVP is markedly up and with edema on CXR and signs of right sided congestion I think he needs CRRT to remove volume. Will consult nephrology and plan to start this am. Support BP with levophed to keep MAP 70-80's. 6. LBBB s/p CRT-D upgrade in 10/2016 without improvement in symptoms or EF. LV epicardial leads cut off at the time of surgery. 7. Prior smoking with mild obstruction and severe diffusion defect on PFT's. Will need attention to pulmonary toilet. CXR shows edema. 8.  Postop leukocytosis and fever. I suspect this is inflammatory this early postop but with his complicated course I would start antibiotics because his risk of pneumonia is higher. 9.  Marked rise in bilirubin and transaminases. Likely due to pump run and right sided congestion.   10. Keep pleural tube and pocket drain for now. 11. Keep arterial line and swan today.    I reviewed the LVAD parameters from today, and compared the results to the patient's prior recorded data.  No programming changes were made.  The LVAD is functioning within specified parameters.    LVAD interrogation was negative for any significant power changes, alarms or PI events/speed drops.  LVAD equipment check completed and is in good working order.  Back-up equipment present.   LVAD education done on emergency procedures and precautions and reviewed exit site care.  Length of Stay: 4  Gaye Pollack 04/23/2017, 8:09 AM

## 2017-04-24 ENCOUNTER — Inpatient Hospital Stay (HOSPITAL_COMMUNITY): Payer: Medicare HMO

## 2017-04-24 ENCOUNTER — Other Ambulatory Visit (HOSPITAL_COMMUNITY): Payer: Self-pay | Admitting: Internal Medicine

## 2017-04-24 DIAGNOSIS — Z95811 Presence of heart assist device: Secondary | ICD-10-CM

## 2017-04-24 DIAGNOSIS — N179 Acute kidney failure, unspecified: Secondary | ICD-10-CM

## 2017-04-24 DIAGNOSIS — I313 Pericardial effusion (noninflammatory): Secondary | ICD-10-CM

## 2017-04-24 DIAGNOSIS — I5023 Acute on chronic systolic (congestive) heart failure: Secondary | ICD-10-CM

## 2017-04-24 LAB — POCT I-STAT 3, ART BLOOD GAS (G3+)
ACID-BASE DEFICIT: 1 mmol/L (ref 0.0–2.0)
ACID-BASE EXCESS: 1 mmol/L (ref 0.0–2.0)
BICARBONATE: 27.5 mmol/L (ref 20.0–28.0)
Bicarbonate: 23.9 mmol/L (ref 20.0–28.0)
O2 SAT: 100 %
O2 SAT: 95 %
PCO2 ART: 52.1 mmHg — AB (ref 32.0–48.0)
PO2 ART: 241 mmHg — AB (ref 83.0–108.0)
PO2 ART: 75 mmHg — AB (ref 83.0–108.0)
Patient temperature: 36.6
Patient temperature: 36.8
TCO2: 29 mmol/L (ref 22–32)
TCO2: 25 mmol/L (ref 22–32)
pCO2 arterial: 41.8 mmHg (ref 32.0–48.0)
pH, Arterial: 7.328 — ABNORMAL LOW (ref 7.350–7.450)
pH, Arterial: 7.365 (ref 7.350–7.450)

## 2017-04-24 LAB — URINALYSIS, ROUTINE W REFLEX MICROSCOPIC
GLUCOSE, UA: 100 mg/dL — AB
Ketones, ur: 15 mg/dL — AB
Nitrite: POSITIVE — AB
Protein, ur: >300 mg/dL — AB
pH: 5 (ref 5.0–8.0)

## 2017-04-24 LAB — URINALYSIS, MICROSCOPIC (REFLEX)

## 2017-04-24 LAB — COMPREHENSIVE METABOLIC PANEL
ALBUMIN: 1.9 g/dL — AB (ref 3.5–5.0)
ALT: 212 U/L — AB (ref 17–63)
AST: 256 U/L — AB (ref 15–41)
Alkaline Phosphatase: 128 U/L — ABNORMAL HIGH (ref 38–126)
Anion gap: 9 (ref 5–15)
BILIRUBIN TOTAL: 10.4 mg/dL — AB (ref 0.3–1.2)
BUN: 24 mg/dL — AB (ref 6–20)
CO2: 24 mmol/L (ref 22–32)
CREATININE: 2.44 mg/dL — AB (ref 0.61–1.24)
Calcium: 7.1 mg/dL — ABNORMAL LOW (ref 8.9–10.3)
Chloride: 98 mmol/L — ABNORMAL LOW (ref 101–111)
GFR calc Af Amer: 33 mL/min — ABNORMAL LOW (ref >60)
GFR, EST NON AFRICAN AMERICAN: 29 mL/min — AB (ref >60)
GLUCOSE: 109 mg/dL — AB (ref 65–99)
Potassium: 4.5 mmol/L (ref 3.5–5.1)
Sodium: 131 mmol/L — ABNORMAL LOW (ref 135–145)
TOTAL PROTEIN: 6 g/dL — AB (ref 6.5–8.1)

## 2017-04-24 LAB — RENAL FUNCTION PANEL
ALBUMIN: 2 g/dL — AB (ref 3.5–5.0)
ALBUMIN: 1.8 g/dL — AB (ref 3.5–5.0)
Anion gap: 8 (ref 5–15)
Anion gap: 7 (ref 5–15)
BUN: 23 mg/dL — ABNORMAL HIGH (ref 6–20)
BUN: 20 mg/dL (ref 6–20)
CALCIUM: 7.4 mg/dL — AB (ref 8.9–10.3)
CALCIUM: 7.2 mg/dL — AB (ref 8.9–10.3)
CHLORIDE: 100 mmol/L — AB (ref 101–111)
CO2: 25 mmol/L (ref 22–32)
CO2: 25 mmol/L (ref 22–32)
CREATININE: 2.41 mg/dL — AB (ref 0.61–1.24)
CREATININE: 2.26 mg/dL — AB (ref 0.61–1.24)
Chloride: 99 mmol/L — ABNORMAL LOW (ref 101–111)
GFR calc Af Amer: 34 mL/min — ABNORMAL LOW (ref >60)
GFR calc non Af Amer: 29 mL/min — ABNORMAL LOW (ref >60)
GFR, EST AFRICAN AMERICAN: 36 mL/min — AB (ref >60)
GFR, EST NON AFRICAN AMERICAN: 31 mL/min — AB (ref >60)
GLUCOSE: 119 mg/dL — AB (ref 65–99)
Glucose, Bld: 123 mg/dL — ABNORMAL HIGH (ref 65–99)
PHOSPHORUS: 3.9 mg/dL (ref 2.5–4.6)
PHOSPHORUS: 3.2 mg/dL (ref 2.5–4.6)
Potassium: 4.4 mmol/L (ref 3.5–5.1)
Potassium: 4.4 mmol/L (ref 3.5–5.1)
SODIUM: 132 mmol/L — AB (ref 135–145)
Sodium: 132 mmol/L — ABNORMAL LOW (ref 135–145)

## 2017-04-24 LAB — GLUCOSE, CAPILLARY
GLUCOSE-CAPILLARY: 96 mg/dL (ref 65–99)
GLUCOSE-CAPILLARY: 132 mg/dL — AB (ref 65–99)
Glucose-Capillary: 123 mg/dL — ABNORMAL HIGH (ref 65–99)
Glucose-Capillary: 117 mg/dL — ABNORMAL HIGH (ref 65–99)
Glucose-Capillary: 123 mg/dL — ABNORMAL HIGH (ref 65–99)
Glucose-Capillary: 126 mg/dL — ABNORMAL HIGH (ref 65–99)

## 2017-04-24 LAB — COOXEMETRY PANEL
CARBOXYHEMOGLOBIN: 1.1 % (ref 0.5–1.5)
Carboxyhemoglobin: 1.6 % — ABNORMAL HIGH (ref 0.5–1.5)
METHEMOGLOBIN: 0.8 % (ref 0.0–1.5)
Methemoglobin: 1.1 % (ref 0.0–1.5)
O2 SAT: 76.4 %
O2 SAT: 60.4 %
TOTAL HEMOGLOBIN: 9.6 g/dL — AB (ref 12.0–16.0)
Total hemoglobin: 9.8 g/dL — ABNORMAL LOW (ref 12.0–16.0)

## 2017-04-24 LAB — CBC WITH DIFFERENTIAL/PLATELET
BASOS ABS: 0 10*3/uL (ref 0.0–0.1)
BASOS PCT: 0 %
EOS PCT: 0 %
Eosinophils Absolute: 0 10*3/uL (ref 0.0–0.7)
HEMATOCRIT: 28.1 % — AB (ref 39.0–52.0)
Hemoglobin: 9.2 g/dL — ABNORMAL LOW (ref 13.0–17.0)
Lymphocytes Relative: 5 %
Lymphs Abs: 1.1 10*3/uL (ref 0.7–4.0)
MCH: 25.3 pg — ABNORMAL LOW (ref 26.0–34.0)
MCHC: 32.7 g/dL (ref 30.0–36.0)
MCV: 77.4 fL — ABNORMAL LOW (ref 78.0–100.0)
MONO ABS: 1.3 10*3/uL — AB (ref 0.1–1.0)
MONOS PCT: 7 %
NEUTROS ABS: 17.5 10*3/uL — AB (ref 1.7–7.7)
Neutrophils Relative %: 88 %
PLATELETS: 127 10*3/uL — AB (ref 150–400)
RBC: 3.63 MIL/uL — ABNORMAL LOW (ref 4.22–5.81)
RDW: 18.4 % — AB (ref 11.5–15.5)
WBC: 19.9 10*3/uL — ABNORMAL HIGH (ref 4.0–10.5)

## 2017-04-24 LAB — PROTIME-INR
INR: 1.95
Prothrombin Time: 22 seconds — ABNORMAL HIGH (ref 11.4–15.2)

## 2017-04-24 LAB — LACTATE DEHYDROGENASE: LDH: 967 U/L — ABNORMAL HIGH (ref 98–192)

## 2017-04-24 LAB — ECHOCARDIOGRAM COMPLETE
HEIGHTINCHES: 65 in
Weight: 2624.36 oz

## 2017-04-24 LAB — PREPARE RBC (CROSSMATCH)

## 2017-04-24 LAB — PHOSPHORUS: PHOSPHORUS: 3.9 mg/dL (ref 2.5–4.6)

## 2017-04-24 LAB — MAGNESIUM: Magnesium: 2.3 mg/dL (ref 1.7–2.4)

## 2017-04-24 LAB — PROCALCITONIN: Procalcitonin: 31.15 ng/mL

## 2017-04-24 MED ORDER — PANTOPRAZOLE SODIUM 40 MG IV SOLR
40.0000 mg | INTRAVENOUS | Status: DC
  Administered 2017-04-24 – 2017-04-27 (×4): 40 mg via INTRAVENOUS
  Filled 2017-04-24 (×4): qty 40

## 2017-04-24 MED ORDER — BISACODYL 10 MG RE SUPP
10.0000 mg | Freq: Every day | RECTAL | Status: DC
  Administered 2017-04-24 – 2017-04-25 (×2): 10 mg via RECTAL
  Filled 2017-04-24 (×2): qty 1

## 2017-04-24 MED ORDER — ASPIRIN 325 MG PO TABS
325.0000 mg | ORAL_TABLET | Freq: Every day | ORAL | Status: DC
  Administered 2017-04-24 – 2017-04-26 (×3): 325 mg
  Filled 2017-04-24 (×3): qty 1

## 2017-04-24 MED ORDER — DOBUTAMINE IN D5W 4-5 MG/ML-% IV SOLN
4.0000 ug/kg/min | INTRAVENOUS | Status: DC
  Administered 2017-04-24 – 2017-04-26 (×2): 5 ug/kg/min via INTRAVENOUS
  Filled 2017-04-24 (×3): qty 250

## 2017-04-24 MED ORDER — BISACODYL 5 MG PO TBEC
10.0000 mg | DELAYED_RELEASE_TABLET | Freq: Every day | ORAL | Status: DC
  Administered 2017-04-26: 10 mg via ORAL
  Filled 2017-04-24: qty 2

## 2017-04-24 MED ORDER — LACTATED RINGERS IV SOLN
INTRAVENOUS | Status: DC
  Administered 2017-04-26: 08:00:00 via INTRAVENOUS

## 2017-04-24 MED ORDER — ORAL CARE MOUTH RINSE
15.0000 mL | OROMUCOSAL | Status: DC
  Administered 2017-04-24 – 2017-04-26 (×25): 15 mL via OROMUCOSAL

## 2017-04-24 MED FILL — Heparin Sodium (Porcine) Inj 1000 Unit/ML: INTRAMUSCULAR | Qty: 30 | Status: AC

## 2017-04-24 MED FILL — Potassium Chloride Inj 2 mEq/ML: INTRAVENOUS | Qty: 40 | Status: AC

## 2017-04-24 MED FILL — Calcium Chloride Inj 10%: INTRAVENOUS | Qty: 10 | Status: AC

## 2017-04-24 MED FILL — Heparin Sodium (Porcine) Inj 1000 Unit/ML: INTRAMUSCULAR | Qty: 20 | Status: AC

## 2017-04-24 MED FILL — Magnesium Sulfate Inj 50%: INTRAMUSCULAR | Qty: 10 | Status: AC

## 2017-04-24 MED FILL — Sodium Bicarbonate IV Soln 8.4%: INTRAVENOUS | Qty: 50 | Status: AC

## 2017-04-24 MED FILL — Electrolyte-R (PH 7.4) Solution: INTRAVENOUS | Qty: 3000 | Status: AC

## 2017-04-24 MED FILL — Mannitol IV Soln 20%: INTRAVENOUS | Qty: 500 | Status: AC

## 2017-04-24 MED FILL — Sodium Chloride IV Soln 0.9%: INTRAVENOUS | Qty: 3000 | Status: AC

## 2017-04-24 MED FILL — Lidocaine HCl IV Inj 20 MG/ML: INTRAVENOUS | Qty: 10 | Status: AC

## 2017-04-24 NOTE — Progress Notes (Signed)
PT Cancellation Note  Patient Details Name: Christos Mixson MRN: [id number] DOB: [birth year]   Cancelled Treatment:    Reason Eval/Treat Not Completed: Medical issues which prohibited therapy. Pt reintubated.   Shary Decamp [facility] 04/24/2017, 8:38 AM Suanne Marker PT [phone]

## 2017-04-24 NOTE — Progress Notes (Signed)
Initial Nutrition Assessment  INTERVENTION:   As pt becomes more stable: Vital 1.5 @ 25 ml/hr (600 ml/day) 60 ml Prostat QID Provides: 1700 kcal, 160 grams protein, and 458 ml free water.    NUTRITION DIAGNOSIS:   Increased nutrient needs related to (LVAD) as evidenced by estimated needs.  GOAL:   Patient will meet greater than or equal to 90% of their needs  MONITOR:   I & O's, Vent status  REASON FOR ASSESSMENT:   Ventilator    ASSESSMENT:   Pt with PMH of HLD, HTN, end-stage CHF admitted for HM-3 LVAD placement 12/27. Extubated 12/28, pt with hypotension and poor UOP CRRT started 12/30. Pt re-intubated 12/30 with worsening acidosis and respiratory distress.    Pt remains hypotensive maxed out on on multiple pressors (levo, vasopressin, and epi). MAP 59, has remained < 60  CRRT now even.  CT: 10 ml x 24 hrs UOP: 25 ml x 24 hrs CRRT: 3624 ml x 24 hrs  Patient is currently intubated on ventilator support MV: 10.9 L/min Temp (24hrs), Avg:97.2 F (36.2 C), Min:93.9 F (34.4 C), Max:99.1 F (37.3 C)  Medications reviewed and include: dulcolax  Labs reviewed: Na 131 (L), AST/ALT: 256/212 (H) CBG: 109    NUTRITION - FOCUSED PHYSICAL EXAM:    Most Recent Value  Orbital Region  No depletion  Upper Arm Region  No depletion  Thoracic and Lumbar Region  No depletion  Buccal Region  No depletion  Temple Region  Mild depletion  Clavicle Bone Region  No depletion  Clavicle and Acromion Bone Region  No depletion  Scapular Bone Region  Unable to assess  Dorsal Hand  No depletion  Patellar Region  No depletion  Anterior Thigh Region  No depletion  Posterior Calf Region  No depletion  Edema (RD Assessment)  Moderate  Hair  Reviewed  Eyes  Unable to assess  Mouth  Unable to assess  Skin  Reviewed  Nails  Reviewed       Diet Order:  Diet NPO time specified  EDUCATION NEEDS:   No education needs have been identified at this time  Skin:  Skin Assessment:  Skin Integrity Issues: Skin Integrity Issues:: Incisions Incisions: sternum and abdomen  Last BM:  12/25  Height:   Ht Readings from Last 1 Encounters:  04/19/17 5\' 5"  (1.651 m)    Weight:   Wt Readings from Last 1 Encounters:  04/24/17 164 lb 0.4 oz (74.4 kg)  Admission weight: 157 lb (71.4 kg)  Ideal Body Weight:  61.8 kg  BMI:  Body mass index is 27.29 kg/m.  Estimated Nutritional Needs:   Kcal:  2876  Protein:  142 -178 grams  Fluid:  >1.5 L/day  Maylon Peppers RD, LDN, CNSC [phone] Pager [phone] After Hours Pager

## 2017-04-24 NOTE — Progress Notes (Addendum)
LVAD Coordinator Rounding Note:  Admitted 12/26 due to decompensated heart failure.  Heartmate III LVAD implanted on 04/20/17 by Gilford Raid under DT criteria.  Vital signs: Tmax: 98.4 HR: 102 Doppler Pressure: 60 Art line BP: 61/47 (56) O2 Sat: 90% on 80% FiO2 Wt: 165>164 lbs   LVAD interrogation reveals:   Speed: 5300 Flow: 4.2 Power:  4 PI: 1.7 Alarms: none Events:  >48 PI events within 24 hours  Hematocrit: 28 Fixed speed: 5300 Low speed limit: 5000   Drive Line: VAD Coordinator performed dressing change at bedside. VAD dressing removed and site care performed using sterile technique. Drive line exit site cleaned with Chlora prep applicators x 2, allowed to dry, and gauze dressing with silver re-applied. Exit site healing and unincorporated, the velour is fully implanted at exit site. No redness, tenderness, foul odor or rash noted.  There is only 1 sutured noted. Drive line secured in anchor x2.      Labs:  LDH trend: 205>407>576>923>967  INR trend: 1.34>1.52>1.73>1.95  Anticoagulation Plan: -INR Goal: 2-2.5 -ASA Dose: 325 mg  Blood Products:  Intra op: 2 units/FFP 04/20/17  PRBC's:  1 unit 12/29 1 unit 12/30   Device: Medtronic BiV -Therapies: off   Respiratory: Extubated 12/28 Reintubated 04/23/2017   Renal:  -BUN/CRT: 13/1.6, 36/3.34, 32/3.13, 23/2.41, 24/2.44 -Renal consulted 04/23/17> CVVHD started  Gtts: Levophed 30 mcg/min Epi 8 mcg/min Precedex 0.5 mcg/kg/hr Milrinone off 04/24/2017 Dobutamine 5 mcg/kg/min Vasopressin 0.04 units/min   Plan/Recommendations:   1. Continue supportive care 2. VAD dressing to be changed daily with silver strip per protocol 3. Please page VAD coordinator for any equipment concerns. 4. VAD education notebook take to room today for family to begin reading.  [name] RN, VAD Coordinator 24/7 pager [phone]

## 2017-04-24 NOTE — Progress Notes (Signed)
[facility] Rounding Note Background: 51 yo WM PMH HTN, HLD, ischemic cardiomyopathy with EF 20-25% previously home inotropes,  s/p VAD 04/20/2017. Pre-op creatinine 1.3-1.4. Rec'd Heartmate III 04/20/17.  AoV sewed shut 2/2 mod AI. Destination therapy with possible bridge to transplant (type O blood which will make wait list time long).  Asked to see for AKI/and volume overload.  Creatinine rose abruptly post VAD to 3.34 with oliguria. No evidence sig hemolysis or thrombosis. CRRT initiated 04/23/17 for pulmonary edema/RF.  CRRT (12/30) Keeping evenR [place] temp cath (12/30) All 4K fluids No heparin Effluent dose 33 ml/kg/hour   Assessment/Recommendations  1. Oliguric AKI on CKD Stage III: Likely hemodynamic hypoperfusion injury.  Didn't appear pt hemolyzing so pigment nephropathy less likely; check UA (not sent yet).  No VAD clotting issues so suspicion of thromboembolic issue/renal infarct low, though LDH now increasing. CRRT initiated 12/30. Initially tolerated fluid removal, now keeping even due to increasing pressor requirements/low MAP. CVP down to 6 but as Dr. Aundra Dubin notes sig JVD and CXR looks like pulmonary edema. K and phos OK 1. Continue to run even, no change in fluids   2. ICM s/p VAD with cardiogenic shock: 12/27 s/p insertion Heartmate III.  Pump speed 5300 Flow 5.2 L/min. Pressor requirements  increasing. MAP in 50's despite epi/NE/dobutamine/vasopressin 3. Leukocytosis - WBC falling some. On cefipime. Pending cultures 4. PAF suspected AF 5. Acute hypoxic RF: Re-intubated 12/30 pulm edema/resp acidosis. Persistent pulmonary edema on CXR today despite some fluid off yesterday 6. Leukocytosis: WBC 35K yesterday. Improved to ~20K on empiric cefepime only  (preop rec'd vanco, cefuroxime, rifampin and fluconazole)  7. Shock liver:  Congestive hepatopathy +/- hypoperfusion injury.   Subjective/Interval History:  Reintubated 12/30 d/t worsening resp status MAP 50's-60 on 3 pressors +  dobutamine Now keeping even w/CRRT  Objective Vital signs in last 24 hours: Vitals:   04/24/17 0800 04/24/17 0805 04/24/17 0900 04/24/17 1000  BP:      Pulse: (!) 102 99    Resp: 13 (!) _0 Temp: 98.6 F (37 C)  98.4 F (36.9 C) 98.2 F (36.8 C)  TempSrc: Core     SpO2:      Weight:      Height:       Weight change: -1.6 kg (-8.4 oz)  Intake/Output Summary (Last 24 hours) at 04/24/2017 1100 Last data filed at 04/24/2017 1000 Gross per 24 hour  Intake 2709.61 ml  Output 4220 ml  Net -1510.39 ml   Physical Exam:  Blood pressure (!) 76/62, pulse 99, temperature 98.2 F (36.8 C), resp. rate 12, height _1  (1.651 m), weight 74.4 kg (164 lb 0.4 oz), SpO2 90 %.  Ill app WM Intubated, sedated R IJ swan, R [place] temp HD cath, chest tube(s), foley, VAD CVP 6/JVP 8-9 Sclerae icteric Ant fairly clear VAD hum Abd sl tight, no BS heard 1+ dependent edema LE's Foley dark yellow brown urine small amounts   Recent Labs  Lab 04/20/17 1416  04/21/17 0259 04/21/17 1622 04/21/17 1634 04/22/17 0356 04/22/17 1701 04/23/17 0336 04/23/17 1558 04/24/17 0446 04/24/17 0816  NA 135   < > 134*  --  134* 129* 132* 126* 129* 132* 131*  K 3.5   < > 4.5  --  4.6 4.6 4.0 3.8 4.7 4.4 4.5  CL 103   < > 103  --  100* 99*  --  95* 98* 99* 98*  CO2 24  --  21*  --   --  19*  --  19* _0 GLUCOSE 143*   < > 188*  --  104* 109* 137* 129* 107* 119* 109*  BUN 10   < > 13  --  14 21*  --  36* 32* 23* 24*  CREATININE 1.29*   < > 1.60* 1.54* 1.40* 2.35*  --  3.34* 3.13* 2.41* 2.44*  CALCIUM 8.2*  --  8.2*  --   --  8.3*  --  7.8* 7.5* 7.4* 7.1*  PHOS  --   --  3.1  --   --  4.4  --  5.5* 6.5* 3.9  3.9  --    < > = values in this interval not displayed.    Recent Labs  Lab 04/22/17 0356 04/23/17 0336 04/23/17 1558 04/24/17 0446 04/24/17 0816  AST 171* 319*  --   --  256*  ALT 77* 208*  --   --  212*  ALKPHOS 75 95  --   --  128*  BILITOT 7.4* 9.6*  --   --  10.4*  PROT 5.8*  5.7*  --   --  6.0*  ALBUMIN 2.5* 2.2* 2.1* 2.0* 1.9*    Recent Labs  Lab 04/21/17 0259 04/21/17 1622  04/22/17 0356 04/22/17 1701 04/23/17 0336 04/23/17 2241 04/24/17 0446  WBC 18.5* 28.9*  --  36.9*  --  34.7*  --  19.9*  NEUTROABS 15.5*  --   --  32.8*  --  31.9*  --  17.5*  HGB 8.9* 8.6*   < > 8.4* 10.2* 8.8* 8.3* 9.2*  HCT 27.6* 26.8*   < > 25.9* 30.0* 26.4* 25.4* 28.1*  MCV 77.3* 77.2*  --  76.9*  --  77.0*  --  77.4*  PLT 177 154  --  156  --  143*  --  127*   < > = values in this interval not displayed.    Recent Labs  Lab 04/23/17 1609 04/23/17 2007 04/23/17 2334 04/24/17 0506 04/24/17 0812  GLUCAP 104* 125* 116* 123* 117*   Results for DAMMON, MAKAREWICZ (MRN [id number]) as of 04/24/2017 11:06  04/21/2017  04/22/2017  04/23/2017  04/24/2017   LDH 407 (H) 576 (H) 923 (H) 967 (H)    Studies/Results: Dg Chest Port 1 View  Result Date: 04/24/2017 CLINICAL DATA:  Status post aortic valve repair and insertion of a left ventricular assist device 4 days ago. EXAM: PORTABLE CHEST 1 VIEW COMPARISON:  Portable chest x-ray of April 23, 2017 FINDINGS: The lungs are adequately inflated. The interstitial markings are increased but fairly stable. There is no alveolar infiltrate. There is no pleural effusion or pneumothorax. The cardiac silhouette remains enlarged. The pulmonary vascularity is engorged but slightly more distinct today. The endotracheal tube tip projects 3.5 cm above the carina. The esophagogastric tube tip and proximal port project in the gastric cardia. The right subclavian venous catheter tip projects over the junction of the middle and distal thirds of the SVC. The Swan-Ganz catheter tip projects over proximal left pulmonary artery branch. The left ventricular assist device is in stable position. The ICD is in stable position. The 2 left-sided chest tubes are in stable position. The sternal wires are intact. IMPRESSION: CHF with moderate pulmonary interstitial  edema perhaps slightly improved today. The support devices are in stable position. Electronically Signed   By: David  Martinique M.D.   On: 04/24/2017 07:37   Dg Chest Port 1 View  Result Date: 04/23/2017 CLINICAL DATA:  Encounter for  intubation. Acute respiratory failure . Ischemic cardiomyopathy. Postop from aortic valve repair and left ventricular assist device. EXAM: PORTABLE CHEST 1 VIEW COMPARISON:  Prior today FINDINGS: New endotracheal tube and nasogastric tube are seen in appropriate position. Other support lines and tubes are unchanged in position. Swan-Ganz catheter is again seen with tip in the proximal right pulmonary artery. Right chest tube remains in place and no pneumothorax is visualized. Stable marked cardiac enlargement. Stable diffuse bilateral pulmonary airspace disease, most likely due to pulmonary edema. IMPRESSION: New endotracheal tube and nasogastric tube in appropriate position. Stable cardiomegaly and diffuse bilateral airspace disease. Electronically Signed   By: Earle Gell M.D.   On: 04/23/2017 17:47   Dg Chest Port 1 View  Result Date: 04/23/2017 CLINICAL DATA:  Encounter for central line placement EXAM: PORTABLE CHEST 1 VIEW COMPARISON:  04/23/2017 FINDINGS: Right subclavian central venous catheter tip is at the cavoatrial junction. Swan-Ganz catheter is identified with tip looped in the expected location of the main pulmonary artery. There is a left chest wall ICD with lead in the right atrial appendage and right ventricle. LVAD device appears stable in position from previous exam. Previous median sternotomy and CABG procedure. Stable cardiac enlargement and pulmonary edema. IMPRESSION: 1. Postsurgical changes are stable compared with previous exam. 2. No change in pulmonary edema pattern. Electronically Signed   By: Kerby Moors M.D.   On: 04/23/2017 10:19   Dg Chest Port 1 View  Result Date: 04/23/2017 CLINICAL DATA:  Follow-up LVAD EXAM: PORTABLE CHEST 1 VIEW  COMPARISON:  04/22/2017 FINDINGS: Cardiac shadow remains enlarged. Swan-Ganz catheter is noted coiled within the pulmonary outflow tract. Left ventricular assist device is again seen and stable. Defibrillator is again noted as well. Left-sided chest tube and pericardial drain are seen. The mediastinal drain has been removed in the interval. The lungs are well aerated bilaterally but again demonstrate patchy infiltrates particularly in the basis. No definitive pneumothorax is seen. IMPRESSION: Postsurgical changes as described Patchy infiltrative changes bilaterally. Electronically Signed   By: Inez Catalina M.D.   On: 04/23/2017 09:28   Medications: . sodium chloride 10 mL/hr at 04/24/17 0700  . sodium chloride 10 mL/hr at 04/24/17 0700  . ceFEPime (MAXIPIME) IV Stopped (04/24/17 0000)  . dexmedetomidine (PRECEDEX) IV infusion 0.5 mcg/kg/hr (04/24/17 0800)  . DOBUTamine 5 mcg/kg/min (04/24/17 0815)  . EPINEPHrine 4 mg in dextrose 5% 250 mL infusion (16 mcg/mL) 8 mcg/min (04/24/17 0950)  . norepinephrine (LEVOPHED) Adult infusion 40 mcg/min (04/24/17 0800)  . dialysis replacement fluid (prismasate) 400 mL/hr at 04/23/17 2307  . dialysis replacement fluid (prismasate) 200 mL/hr at 04/23/17 1056  . dialysate (PRISMASATE) 1,700 mL/hr at 04/24/17 0855  . sodium chloride    . vasopressin (PITRESSIN) infusion - *FOR SHOCK* 0.04 Units/min (04/24/17 0950)   . aspirin  325 mg Per Tube Daily  . bisacodyl  10 mg Rectal Daily   Or  . bisacodyl  10 mg Oral Daily  . chlorhexidine gluconate (MEDLINE KIT)  15 mL Mouth Rinse BID  . Chlorhexidine Gluconate Cloth  6 each Topical Daily  . insulin aspart  0-24 Units Subcutaneous Q4H  . levalbuterol  1.25 mg Nebulization Q6H  . mouth rinse  15 mL Mouth Rinse 10 times per day  . pantoprazole (PROTONIX) IV  40 mg Intravenous Q24H  . sodium chloride flush  10-40 mL Intracatheter Q12H  . sodium chloride flush  3 mL Intravenous Q12H  . Warfarin - Physician Dosing  Inpatient   Does  not apply q1800    Jamal Maes, MD [facility] [phone] pager 04/24/2017, 11:00 AM

## 2017-04-24 NOTE — Progress Notes (Signed)
Patient ID: David [name], male   DOB: 03/20/1966, 51 y.o.   MRN: [id number] HeartMate 3 Rounding Note  Subjective:    Remains on vent. Oxygenation much better this am at 241 and FiO2 decreased to 80%. Sedated to maintain synchrony with vent.  CI 3.0, Co-ox 76.4 Remains on Levophed 30, milrinone 0.375, epi 8, VP 0.03 with MAP still 60's. VAD parameters have been stable with Flow >5, PI 1.8-2.0 This am CVP 6, PA 32/21.    LVAD INTERROGATION:  HeartMate IIl LVAD:  Flow 5 liters/min, speed 5300, power 3.8, PI 1.8.  Many PI events overnight when he woke up dyssynchronous with vent.   Objective:    Vital Signs:   Temp:  [93.9 F (34.4 C)-98.6 F (37 C)] 98.1 F (36.7 C) (12/31 0645) Pulse Rate:  [25-112] 83 (12/31 0245) Resp:  [12-34] 14 (12/31 0645) BP: (76-81)/(62-66) 76/62 (12/30 1800) SpO2:  [90 %-100 %] 90 % (12/31 0237) Arterial Line BP: (43-97)/(37-79) 65/52 (12/31 0645) FiO2 (%):  [80 %-100 %] 80 % (12/31 0515) Weight:  [74.4 kg (164 lb 0.4 oz)] 74.4 kg (164 lb 0.4 oz) (12/31 0600) Last BM Date: 04/18/17 Mean arterial Pressure 60's  Intake/Output:   Intake/Output Summary (Last 24 hours) at 04/24/2017 0753 Last data filed at 04/24/2017 0600 Gross per 24 hour  Intake 2593.41 ml  Output 3750 ml  Net -1156.59 ml     Physical Exam: General:  Intubated and sedated Cor:  LVAD hum present. Lungs: bilateral rales Abdomen: soft, nondistended. No bowel sounds. Extremities: anasarca Neuro: sedated on vent  Telemetry: atrial fib 99  Labs: Basic Metabolic Panel: Recent Labs  Lab 04/21/17 0259 04/21/17 1622 04/21/17 1634 04/22/17 0356 04/22/17 1701 04/23/17 0336 04/23/17 1558 04/24/17 0446  NA 134*  --  134* 129* 132* 126* 129* 132*  K 4.5  --  4.6 4.6 4.0 3.8 4.7 4.4  CL 103  --  100* 99*  --  95* 98* 99*  CO2 21*  --   --  19*  --  19* 23 25  GLUCOSE 188*  --  104* 109* 137* 129* 107* 119*  BUN 13  --  14 21*  --  36* 32* 23*  CREATININE 1.60* 1.54* 1.40*  2.35*  --  3.34* 3.13* 2.41*  CALCIUM 8.2*  --   --  8.3*  --  7.8* 7.5* 7.4*  MG 2.3 1.9  --  1.8  --  2.5*  --  2.3  PHOS 3.1  --   --  4.4  --  5.5* 6.5* 3.9  3.9    Liver Function Tests: Recent Labs  Lab 04/19/17 1928 04/21/17 0259 04/22/17 0356 04/23/17 0336 04/23/17 1558 04/24/17 0446  AST 20 95* 171* 319*  --   --   ALT 12* 19 77* 208*  --   --   ALKPHOS 100 61 75 95  --   --   BILITOT 1.1 4.0* 7.4* 9.6*  --   --   PROT 6.5 5.6* 5.8* 5.7*  --   --   ALBUMIN 2.3* 3.0* 2.5* 2.2* 2.1* 2.0*   No results for input(s): LIPASE, AMYLASE in the last 168 hours. No results for input(s): AMMONIA in the last 168 hours.  CBC: Recent Labs  Lab 04/21/17 0259 04/21/17 1622  04/22/17 0356 04/22/17 1701 04/23/17 0336 04/23/17 2241 04/24/17 0446  WBC 18.5* 28.9*  --  36.9*  --  34.7*  --  19.9*  NEUTROABS 15.5*  --   --  32.8*  --  31.9*  --  17.5*  HGB 8.9* 8.6*   < > 8.4* 10.2* 8.8* 8.3* 9.2*  HCT 27.6* 26.8*   < > 25.9* 30.0* 26.4* 25.4* 28.1*  MCV 77.3* 77.2*  --  76.9*  --  77.0*  --  77.4*  PLT 177 154  --  156  --  143*  --  127*   < > = values in this interval not displayed.    INR: Recent Labs  Lab 04/20/17 1416 04/21/17 0259 04/22/17 0356 04/23/17 0336 04/24/17 0446  INR 1.42  1.44 1.34 1.52 1.73 1.95    Other results:  EKG:   Imaging: Dg Chest Port 1 View  Result Date: 04/24/2017 CLINICAL DATA:  Status post aortic valve repair and insertion of a left ventricular assist device 4 days ago. EXAM: PORTABLE CHEST 1 VIEW COMPARISON:  Portable chest x-ray of April 23, 2017 FINDINGS: The lungs are adequately inflated. The interstitial markings are increased but fairly stable. There is no alveolar infiltrate. There is no pleural effusion or pneumothorax. The cardiac silhouette remains enlarged. The pulmonary vascularity is engorged but slightly more distinct today. The endotracheal tube tip projects 3.5 cm above the carina. The esophagogastric tube tip and  proximal port project in the gastric cardia. The right subclavian venous catheter tip projects over the junction of the middle and distal thirds of the SVC. The Swan-Ganz catheter tip projects over proximal left pulmonary artery branch. The left ventricular assist device is in stable position. The ICD is in stable position. The 2 left-sided chest tubes are in stable position. The sternal wires are intact. IMPRESSION: CHF with moderate pulmonary interstitial edema perhaps slightly improved today. The support devices are in stable position. Electronically Signed   By: David  Martinique M.D.   On: 04/24/2017 07:37   Dg Chest Port 1 View  Result Date: 04/23/2017 CLINICAL DATA:  Encounter for intubation. Acute respiratory failure . Ischemic cardiomyopathy. Postop from aortic valve repair and left ventricular assist device. EXAM: PORTABLE CHEST 1 VIEW COMPARISON:  Prior today FINDINGS: New endotracheal tube and nasogastric tube are seen in appropriate position. Other support lines and tubes are unchanged in position. Swan-Ganz catheter is again seen with tip in the proximal right pulmonary artery. Right chest tube remains in place and no pneumothorax is visualized. Stable marked cardiac enlargement. Stable diffuse bilateral pulmonary airspace disease, most likely due to pulmonary edema. IMPRESSION: New endotracheal tube and nasogastric tube in appropriate position. Stable cardiomegaly and diffuse bilateral airspace disease. Electronically Signed   By: Earle Gell M.D.   On: 04/23/2017 17:47   Dg Chest Port 1 View  Result Date: 04/23/2017 CLINICAL DATA:  Encounter for central line placement EXAM: PORTABLE CHEST 1 VIEW COMPARISON:  04/23/2017 FINDINGS: Right subclavian central venous catheter tip is at the cavoatrial junction. Swan-Ganz catheter is identified with tip looped in the expected location of the main pulmonary artery. There is a left chest wall ICD with lead in the right atrial appendage and right  ventricle. LVAD device appears stable in position from previous exam. Previous median sternotomy and CABG procedure. Stable cardiac enlargement and pulmonary edema. IMPRESSION: 1. Postsurgical changes are stable compared with previous exam. 2. No change in pulmonary edema pattern. Electronically Signed   By: Kerby Moors M.D.   On: 04/23/2017 10:19   Dg Chest Port 1 View  Result Date: 04/23/2017 CLINICAL DATA:  Follow-up LVAD EXAM: PORTABLE CHEST 1 VIEW COMPARISON:  04/22/2017 FINDINGS: Cardiac shadow remains enlarged.  Swan-Ganz catheter is noted coiled within the pulmonary outflow tract. Left ventricular assist device is again seen and stable. Defibrillator is again noted as well. Left-sided chest tube and pericardial drain are seen. The mediastinal drain has been removed in the interval. The lungs are well aerated bilaterally but again demonstrate patchy infiltrates particularly in the basis. No definitive pneumothorax is seen. IMPRESSION: Postsurgical changes as described Patchy infiltrative changes bilaterally. Electronically Signed   By: Inez Catalina M.D.   On: 04/23/2017 09:28   Dg Chest Port 1 View  Result Date: 04/22/2017 CLINICAL DATA:  Pneumonia EXAM: PORTABLE CHEST 1 VIEW COMPARISON:  April 21, 2017 FINDINGS: The heart size and mediastinal contours are stable. Swan-Ganz catheter is unchanged. Left chest tube and mediastinal drain are unchanged. Cardiac pacemaker is stable. Previously noted endotracheal tube is removed. The heart size is enlarged. There is pulmonary edema. Patchy consolidation of left lung base is unchanged. The visualized skeletal structures are stable. IMPRESSION: Pulmonary edema.  Patchy consolidation of left lung base unchanged. Electronically Signed   By: Abelardo Diesel M.D.   On: 04/22/2017 10:28      Medications:     Scheduled Medications: . aspirin EC  325 mg Oral Daily  . bisacodyl  10 mg Oral Daily  . chlorhexidine gluconate (MEDLINE KIT)  15 mL Mouth  Rinse BID  . Chlorhexidine Gluconate Cloth  6 each Topical Daily  . insulin aspart  0-24 Units Subcutaneous Q4H  . levalbuterol  1.25 mg Nebulization Q6H  . mouth rinse  15 mL Mouth Rinse QID  . pantoprazole (PROTONIX) IV  40 mg Intravenous Q24H  . sodium chloride flush  10-40 mL Intracatheter Q12H  . sodium chloride flush  3 mL Intravenous Q12H  . Warfarin - Physician Dosing Inpatient   Does not apply q1800     Infusions: . sodium chloride 10 mL/hr at 04/24/17 0600  . sodium chloride 10 mL/hr at 04/24/17 0600  . ceFEPime (MAXIPIME) IV Stopped (04/24/17 0000)  . dexmedetomidine (PRECEDEX) IV infusion 0.5 mcg/kg/hr (04/24/17 0645)  . DOBUTamine    . EPINEPHrine 4 mg in dextrose 5% 250 mL infusion (16 mcg/mL) 8 mcg/min (04/24/17 0600)  . norepinephrine (LEVOPHED) Adult infusion 30 mcg/min (04/24/17 0620)  . dialysis replacement fluid (prismasate) 400 mL/hr at 04/23/17 2307  . dialysis replacement fluid (prismasate) 200 mL/hr at 04/23/17 1056  . dialysate (PRISMASATE) 1,700 mL/hr at 04/24/17 0510  . sodium chloride    . vasopressin (PITRESSIN) infusion - *FOR SHOCK* 0.04 Units/min (04/24/17 0741)     PRN Medications:  sodium chloride, heparin, morphine injection, ondansetron (ZOFRAN) IV, oxyCODONE, sodium chloride, sodium chloride flush, sodium chloride flush, traMADol   Assessment/Plan/Discussion:   1. Ischemic cardiomyopathy s/p multiple PCI's with chronic systolic heart failure and EF 20% on home dobutamine preop with significant improvement in symptoms. Evaluated at [facility] for heart transplant but LVAD felt to be the best treatment for him at this time. POD4s/p HeartMate III LVAD. He is critically ill with multi-system organ failure. Remains hypotensive despite high dose levophed, epi and vasopressin. Will switch milrinone to dobutamine since he had hypotension with milrinone preop and with renal failure milrinone is not ideal.  Wean FiO2 as tolerated.  2. H/O Hypertension 3.  Hyperlipidemia 4. PAF in sinuspreopon amiodarone and Eliquis.Now back in atrial fib. 5. Stage III CKD. Acute on chronic postop renal failure with anuria now on CRRT. Took off some volume yesterday but limited due to hypotension. CVP 6 this am. 6. LBBB s/p CRT-D  upgrade in 10/2016 without improvement in symptoms or EF. LV epicardial leads cut off at the time of surgery. 7. Prior smoking with mild obstruction and severe diffusion defect on PFT's. Will need attention to pulmonary toilet.CXR shows edema. 8. Postop leukocytosis and fever. I suspect this is inflammatory this early postop but with his complicated course he was started antibiotics because his risk of pneumonia is higher. CXR shows diffuse interstitial air space disease that could be edema or acute lung injury. 9. Marked rise in bilirubin and transaminases. Likely due to pump run and right sided congestion, hypotension.  10. Keep pleural tube and pocket drain for now. 11. INR is 1.95 today after one small dose of Coumadin a couple days ago. Hold off on anticoagulation for now. LDH is 900's but this is due to liver dysfunction at this point.   I discussed status with wife at bedside.   I reviewed the LVAD parameters from today, and compared the results to the patient's prior recorded data.  No programming changes were made.  The LVAD is functioning within specified parameters.   LVAD interrogation was negative for any significant power changes, alarms or PI events/speed drops.  LVAD equipment check completed and is in good working order.  Back-up equipment present.   LVAD education done on emergency procedures and precautions and reviewed exit site care.  Length of Stay: 5  Gaye Pollack 04/24/2017, 7:53 AM

## 2017-04-24 NOTE — Progress Notes (Signed)
OT Cancellation Note  Patient Details Name: David [name] MRN: [id number] DOB: 1965/05/26   Cancelled Treatment:    Reason Eval/Treat Not Completed: Medical issues which prohibited therapy(Pt emergently intubated 12/30. Will follow.)  [name] 04/24/2017, 8:12 AM  04/24/2017 Nestor Lewandowsky, OTR/L Pager: [phone]

## 2017-04-24 NOTE — Progress Notes (Signed)
Patient ID: David [name], male   DOB: 1965/12/14, 51 y.o.   MRN: [id number]  TCTS  He has had stable hemodynamics today although MAP is still in the 60's on levophed 40, epi 8, dobut 5, vasopressin 0.06.   CVP has been 9-10.  VAD flow 5L with PI 1.8  Afebrile with negative cultures so far.  Echo today reviewed and RV seems to be working ok with mild hypokinesis. Septum slightly to the right. LV decompressed with good cannula position. No significant pericardial effusion.  Will continue current supportive care and hope that his vasodilatory shock improves. I have avoided giving any further fluid or albumin or blood today since he has pulmonary edema/ALI on CXR this am and increased oxygen requirement.  I have discussed status and plans with wife and other family multiple times today. They all understand his critical condition.

## 2017-04-24 NOTE — Progress Notes (Signed)
*  PRELIMINARY RESULTS* Echocardiogram 2D Echocardiogram has been performed.  David [name] 04/24/2017, 12:34 PM

## 2017-04-24 NOTE — Progress Notes (Signed)
Patient ID: David [name], male   DOB: 1966-04-04, 51 y.o.   MRN: [id number]   Advanced Heart Failure VAD Team Note  Subjective:    David [name] is a 51 y.o. male with a PMH of ICM ('09 PCI to LAD, mRCA, '10 PCI to Lcx and '12 mRCA), HFrEF (EF ~20% for >5 years), HLD and HTN admitted 12/26 for VAD placement due to end-stage systolic HF.   Underwent successful HM-3 LVAD placement 04/20/17.  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   Extubated 12/28.  Epinephrine stopped but then UOP and co-ox dropped so restarted.  MAP lower when sitting in chair yesterday so norepinephrine restarted 12/29.  Poor UOP despite Lasix gtt and creatinine continues to rise, up to 3.34.    Nephrology consulted 04/23/17. Femoral line placed and CVVH begun.   CVP 5-6 this am, though ? Accuracy as JVP remains elevated to at least 8-9 cm. Had ultrafiltration with 100 cc/hr off for about 6 hrs yesterday and overnight.  However, now running even. MAP 60s.   Intubated 04/23/17 with worsening acidosis and resp distress on Bipap. Remains on levophed 30, vasopressin 0.04, and epi 8. Remains on milrinone 0.375 mcg/kg/min. Coox 76.4% this am.   LDH 407 -> 576 -> 923 -> 967 INR 1.34 -> 1.52 -> 1.73 -> 1.95  (only on warfarin post op)  Afebrile. WBC down to 19.9. CXR with pulmonary edema.  LFTs elevated with tbili 9.6 04/23/17  Swan numbers reviewed CVP 4-6 though ? Accuracy. JVP appears to be at least 8-9 cm PA 38/23 CO 5.5 CI 3.0  LVAD INTERROGATION:  HeartMate III LVAD:  Flow 5.2 liters/min, speed 5300, power 4.0, PI 1.7. Occasional PI events   Objective:    Vital Signs:   Temp:  [93.9 F (34.4 C)-98.6 F (37 C)] 98.1 F (36.7 C) (12/31 0645) Pulse Rate:  [25-112] 83 (12/31 0245) Resp:  [12-34] 14 (12/31 0645) BP: (76-81)/(62-66) 76/62 (12/30 1800) SpO2:  [90 %-100 %] 90 % (12/31 0237) Arterial Line BP: (43-97)/(37-79) 65/52 (12/31 0645) FiO2 (%):  [80 %-100 %] 80 % (12/31 0515) Weight:   [164 lb 0.4 oz (74.4 kg)] 164 lb 0.4 oz (74.4 kg) (12/31 0600) Last BM Date: 04/18/17 Mean arterial Pressure 50-60s this am.   Intake/Output:   Intake/Output Summary (Last 24 hours) at 04/24/2017 0705 Last data filed at 04/24/2017 0600 Gross per 24 hour  Intake 2593.41 ml  Output 3750 ml  Net -1156.59 ml     Physical Exam    GENERAL: Intubated and sedated. HEENT: Normal. NECK: Right IJ swan, JVP appears 8-9 cm. Carotids OK.  CARDIAC:  Mechanical heart sounds with LVAD hum present.  LUNGS:  Dependent crackles. Mechanical breathing sounds.    ABDOMEN:  NT, ND, no HSM. No bruits or masses. +BS  LVAD exit site:  Dressing dry and intact. No erythema or drainage. Stabilization device present and accurately applied. Driveline dressing changed daily per sterile technique. EXTREMITIES:  Warm and dry. No cyanosis, clubbing, or rash. Trace to 1+ edema. NEUROLOGIC:  Intubated and sedated.   Telemetry   Suspect Afib, Rates 80-90s, personally reviewed.   Labs   Basic Metabolic Panel: Recent Labs  Lab 04/21/17 0259 04/21/17 1622 04/21/17 1634 04/22/17 0356 04/22/17 1701 04/23/17 0336 04/23/17 1558 04/24/17 0446  NA 134*  --  134* 129* 132* 126* 129* 132*  K 4.5  --  4.6 4.6 4.0 3.8 4.7 4.4  CL 103  --  100* 99*  --  95* 98* 99*  CO2 21*  --   --  19*  --  19* 23 25  GLUCOSE 188*  --  104* 109* 137* 129* 107* 119*  BUN 13  --  14 21*  --  36* 32* 23*  CREATININE 1.60* 1.54* 1.40* 2.35*  --  3.34* 3.13* 2.41*  CALCIUM 8.2*  --   --  8.3*  --  7.8* 7.5* 7.4*  MG 2.3 1.9  --  1.8  --  2.5*  --  2.3  PHOS 3.1  --   --  4.4  --  5.5* 6.5* 3.9  3.9    Liver Function Tests: Recent Labs  Lab 04/19/17 1928 04/21/17 0259 04/22/17 0356 04/23/17 0336 04/23/17 1558 04/24/17 0446  AST 20 95* 171* 319*  --   --   ALT 12* 19 77* 208*  --   --   ALKPHOS 100 61 75 95  --   --   BILITOT 1.1 4.0* 7.4* 9.6*  --   --   PROT 6.5 5.6* 5.8* 5.7*  --   --   ALBUMIN 2.3* 3.0* 2.5* 2.2*  2.1* 2.0*   No results for input(s): LIPASE, AMYLASE in the last 168 hours. No results for input(s): AMMONIA in the last 168 hours.  CBC: Recent Labs  Lab 04/21/17 0259 04/21/17 1622  04/22/17 0356 04/22/17 1701 04/23/17 0336 04/23/17 2241 04/24/17 0446  WBC 18.5* 28.9*  --  36.9*  --  34.7*  --  19.9*  NEUTROABS 15.5*  --   --  32.8*  --  31.9*  --  17.5*  HGB 8.9* 8.6*   < > 8.4* 10.2* 8.8* 8.3* 9.2*  HCT 27.6* 26.8*   < > 25.9* 30.0* 26.4* 25.4* 28.1*  MCV 77.3* 77.2*  --  76.9*  --  77.0*  --  77.4*  PLT 177 154  --  156  --  143*  --  127*   < > = values in this interval not displayed.    INR: Recent Labs  Lab 04/20/17 1416 04/21/17 0259 04/22/17 0356 04/23/17 0336 04/24/17 0446  INR 1.42  1.44 1.34 1.52 1.73 1.95    Other results:  Imaging   Dg Chest Port 1 View  Result Date: 04/23/2017 CLINICAL DATA:  Encounter for intubation. Acute respiratory failure . Ischemic cardiomyopathy. Postop from aortic valve repair and left ventricular assist device. EXAM: PORTABLE CHEST 1 VIEW COMPARISON:  Prior today FINDINGS: New endotracheal tube and nasogastric tube are seen in appropriate position. Other support lines and tubes are unchanged in position. Swan-Ganz catheter is again seen with tip in the proximal right pulmonary artery. Right chest tube remains in place and no pneumothorax is visualized. Stable marked cardiac enlargement. Stable diffuse bilateral pulmonary airspace disease, most likely due to pulmonary edema. IMPRESSION: New endotracheal tube and nasogastric tube in appropriate position. Stable cardiomegaly and diffuse bilateral airspace disease. Electronically Signed   By: Earle Gell M.D.   On: 04/23/2017 17:47   Dg Chest Port 1 View  Result Date: 04/23/2017 CLINICAL DATA:  Encounter for central line placement EXAM: PORTABLE CHEST 1 VIEW COMPARISON:  04/23/2017 FINDINGS: Right subclavian central venous catheter tip is at the cavoatrial junction. Swan-Ganz  catheter is identified with tip looped in the expected location of the main pulmonary artery. There is a left chest wall ICD with lead in the right atrial appendage and right ventricle. LVAD device appears stable in position from previous exam. Previous median sternotomy and CABG  procedure. Stable cardiac enlargement and pulmonary edema. IMPRESSION: 1. Postsurgical changes are stable compared with previous exam. 2. No change in pulmonary edema pattern. Electronically Signed   By: Kerby Moors M.D.   On: 04/23/2017 10:19   Dg Chest Port 1 View  Result Date: 04/23/2017 CLINICAL DATA:  Follow-up LVAD EXAM: PORTABLE CHEST 1 VIEW COMPARISON:  04/22/2017 FINDINGS: Cardiac shadow remains enlarged. Swan-Ganz catheter is noted coiled within the pulmonary outflow tract. Left ventricular assist device is again seen and stable. Defibrillator is again noted as well. Left-sided chest tube and pericardial drain are seen. The mediastinal drain has been removed in the interval. The lungs are well aerated bilaterally but again demonstrate patchy infiltrates particularly in the basis. No definitive pneumothorax is seen. IMPRESSION: Postsurgical changes as described Patchy infiltrative changes bilaterally. Electronically Signed   By: Inez Catalina M.D.   On: 04/23/2017 09:28   Dg Chest Port 1 View  Result Date: 04/22/2017 CLINICAL DATA:  Pneumonia EXAM: PORTABLE CHEST 1 VIEW COMPARISON:  April 21, 2017 FINDINGS: The heart size and mediastinal contours are stable. Swan-Ganz catheter is unchanged. Left chest tube and mediastinal drain are unchanged. Cardiac pacemaker is stable. Previously noted endotracheal tube is removed. The heart size is enlarged. There is pulmonary edema. Patchy consolidation of left lung base is unchanged. The visualized skeletal structures are stable. IMPRESSION: Pulmonary edema.  Patchy consolidation of left lung base unchanged. Electronically Signed   By: Abelardo Diesel M.D.   On: 04/22/2017 10:28       Medications:     Scheduled Medications: . aspirin EC  325 mg Oral Daily  . bisacodyl  10 mg Oral Daily  . chlorhexidine gluconate (MEDLINE KIT)  15 mL Mouth Rinse BID  . Chlorhexidine Gluconate Cloth  6 each Topical Daily  . docusate sodium  200 mg Oral Daily  . insulin aspart  0-24 Units Subcutaneous Q4H  . levalbuterol  1.25 mg Nebulization Q6H  . mouth rinse  15 mL Mouth Rinse QID  . pantoprazole  40 mg Oral Daily  . sodium chloride flush  10-40 mL Intracatheter Q12H  . sodium chloride flush  3 mL Intravenous Q12H  . Warfarin - Physician Dosing Inpatient   Does not apply q1800    Infusions: . sodium chloride 10 mL/hr at 04/24/17 0600  . sodium chloride 10 mL/hr at 04/24/17 0600  . ceFEPime (MAXIPIME) IV Stopped (04/24/17 0000)  . dexmedetomidine (PRECEDEX) IV infusion 0.5 mcg/kg/hr (04/24/17 0645)  . EPINEPHrine 4 mg in dextrose 5% 250 mL infusion (16 mcg/mL) 8 mcg/min (04/24/17 0600)  . lactated ringers Stopped (04/21/17 1932)  . lactated ringers 10 mL/hr at 04/24/17 0600  . milrinone 0.375 mcg/kg/min (04/24/17 0600)  . norepinephrine (LEVOPHED) Adult infusion 30 mcg/min (04/24/17 0620)  . dialysis replacement fluid (prismasate) 400 mL/hr at 04/23/17 2307  . dialysis replacement fluid (prismasate) 200 mL/hr at 04/23/17 1056  . dialysate (PRISMASATE) 1,700 mL/hr at 04/24/17 0510  . sodium chloride    . vasopressin (PITRESSIN) infusion - *FOR SHOCK* 0.03 Units/min (04/24/17 0600)    PRN Medications: sodium chloride, heparin, linaclotide, morphine injection, ondansetron (ZOFRAN) IV, oxyCODONE, sodium chloride, sodium chloride flush, sodium chloride flush, traMADol   Patient Profile   Rigoberto Repass is a 51 y.o. male with a PMH of ICM ('09 PCI to LAD, mRCA, '10 PCI to Lcx and '12 mRCA), HFrEF (EF ~20% for >5 years) s/p HM-III VAD placement on 12/27 due to end-stage systolic HF.   Assessment/Plan:    1.  Acute on chronic systolic HF: Echo 24/23/53 with LVEF 20-25%,  Mild MR, Mild/Mod MR, Severe LAE, Mildly reduced RV, PA peak pressure 56 mm Hg.  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 Extubated, initially stopped epinephrine/norepinephrine but restarted with fall in UOP and MAP.  RV looked ok in OR per Dr. Cyndia Bent.  - Worsened yesterday with decompensation, soft pressures, and resp distress.  - Now re-intubated.  - Remains on pressor support at levophed 30, vasopressin 0.04, and epi 8. Remains on milrinone 0.375 mcg/kg/min. Coox 76.4% this am.  - MAPs soft in 60s. Continue to titrate norepi as needed.  - Continue ASA 325 until INR > 2.  - Warfarin goal INR 2-2.5.  - LDH trending up. INR 1.9 this am. Surgery managing coumadin. 2. CAD s/p PCI to mid RCA and PLOM with DES x 2 and DES to mid LAD in 1/18.   - Continue ASA and statin. No change.  3. PAF: Suspect atrial fibrillation but rate is controlled. He is on po amiodarone but do not think this caused rise in LFTs.  No change.  4. ARF on CKD stage III: Creatinine up to 3.3 with poor UOP despite Lasix gtt. Suspect some degree of intrinsic renal disease prior to LVAD placement (baseline creatinine around 1.5) with probable ATN from intra-op/peri-op hypotension.   - Now on CRRT, running even at this point with low MAP and CVP 6.  - Maintain MAP with titration of norepinephrine.  5. ABLA, expected: Got 1 unit PRBCs on 12/30.  - Hgb stable at 9.2 this am. Continue to follow.  6. ID: WBCs down to 19.9. - Cultures NGTD.  - Discussed with Dr. Cyndia Bent, think inflammatory most likely but given increasing pressor requirement have been covering empirically with cefepime.  Holding off on vancomycin with renal failure and no supporting culture data.  7. Elevated LFTs: tbili to 9.6.  Suspect probably a degree of shock liver peri-operatively as well as passive congestion with rising CVP.  Should improve with CVVH/volume removal. Continue to follow.   Length of Stay: [address]  Annamaria Helling 04/24/2017, 7:05 AM  VAD Team --- VAD ISSUES ONLY--- Pager [phone] (7am - 7am)  Advanced Heart Failure Team  Pager [phone] (M-F; 7a - 4p)  Please contact [facility] for night-coverage after hours (4p -7a ) and weekends on [email or web address]  Patient seen with NP, agree with the above note.  I made changes to the note to reflect my thoughts.    Progressive hypercarbia and lethargy yesterday on Bipap, patient was intubated.  After intubation and sedation, MAP and PI fell.  Speed decreased back to 5300. Has required steady up-titration of pressors, MAP remains low in 60s this morning.  Had UF via CVVH with up to 100 cc/hr off yesterday but now running even.  CVP 6.  Co-ox and CI off Swan both suggest adequate cardiac output and VAD parameters were reviewed and are stable.  Patient continues on cefepime, WBCs coming down.   On exam, JVP still appears somewhat elevated though CVP readings are no longer high.  He is intubated and sedated, some dependent crackles on lung exam and normal LVAD sounds.    Patient is critically ill.  Now on CVVH and intubated, still hypotensive on pressors.  Suspect that there is a vasodilatory component to shock with low CVP though WBCs coming down, afebrile, and cultures so far negative. - Continue cefepime and follow culture data. CXR looks like pulmonary  edema.  - Run CVVH even for now with CVP 6 and low MAP.   - Will get echo today to look for pericardial effusion or other complication related to VAD though doubt as LVAD parameters remain stable with good flow.  - Switch from milrinone to dobutamine (?if milrinone might be driving some of the hypotension).  - Increase norepinephrine to 40.   CRITICAL CARE Performed by: Loralie Champagne  Total critical care time: 45 minutes  Critical care time was exclusive of separately billable procedures and treating other patients.  Critical care was necessary to treat or prevent imminent or life-threatening  deterioration.  Critical care was time spent personally by me on the following activities: development of treatment plan with patient and/or surrogate as well as nursing, discussions with consultants, evaluation of patient's response to treatment, examination of patient, obtaining history from patient or surrogate, ordering and performing treatments and interventions, ordering and review of laboratory studies, ordering and review of radiographic studies, pulse oximetry and re-evaluation of patient's condition.  Loralie Champagne 04/24/2017 8:23 AM

## 2017-04-24 NOTE — Progress Notes (Signed)
CSW met with wife and family in the waiting room. Wife shared patient's decline yesterday and now reintubated due to respiratory distress. Wife stated patient shared "I'm tired" multiple times leading up to the surgery. Multiple family members present and supporting wife and daughters. CSW provided supportive intervention and will continue to be available throughout implant hospitalization. Jackie , LCSW, CCSW-MCS [phone]   

## 2017-04-25 ENCOUNTER — Inpatient Hospital Stay (HOSPITAL_COMMUNITY): Payer: Medicare HMO

## 2017-04-25 LAB — COMPREHENSIVE METABOLIC PANEL
ALK PHOS: 152 U/L — AB (ref 38–126)
ALT: 201 U/L — ABNORMAL HIGH (ref 17–63)
AST: 223 U/L — ABNORMAL HIGH (ref 15–41)
Albumin: 1.8 g/dL — ABNORMAL LOW (ref 3.5–5.0)
Anion gap: 6 (ref 5–15)
BILIRUBIN TOTAL: 9.8 mg/dL — AB (ref 0.3–1.2)
BUN: 19 mg/dL (ref 6–20)
CALCIUM: 7.6 mg/dL — AB (ref 8.9–10.3)
CO2: 26 mmol/L (ref 22–32)
Chloride: 101 mmol/L (ref 101–111)
Creatinine, Ser: 1.99 mg/dL — ABNORMAL HIGH (ref 0.61–1.24)
GFR, EST AFRICAN AMERICAN: 42 mL/min — AB (ref >60)
GFR, EST NON AFRICAN AMERICAN: 37 mL/min — AB (ref >60)
Glucose, Bld: 123 mg/dL — ABNORMAL HIGH (ref 65–99)
Potassium: 4.5 mmol/L (ref 3.5–5.1)
Sodium: 133 mmol/L — ABNORMAL LOW (ref 135–145)
TOTAL PROTEIN: 6 g/dL — AB (ref 6.5–8.1)

## 2017-04-25 LAB — COOXEMETRY PANEL
Carboxyhemoglobin: 1.2 % (ref 0.5–1.5)
Methemoglobin: 1 % (ref 0.0–1.5)
O2 SAT: 78.8 %
TOTAL HEMOGLOBIN: 7.7 g/dL — AB (ref 12.0–16.0)

## 2017-04-25 LAB — POCT I-STAT 3, ART BLOOD GAS (G3+)
Acid-Base Excess: 1 mmol/L (ref 0.0–2.0)
BICARBONATE: 26.8 mmol/L (ref 20.0–28.0)
Bicarbonate: 26.6 mmol/L (ref 20.0–28.0)
Bicarbonate: 26.1 mmol/L (ref 20.0–28.0)
O2 SAT: 99 %
O2 Saturation: 100 %
O2 Saturation: 100 %
PCO2 ART: 48.3 mmHg — AB (ref 32.0–48.0)
PCO2 ART: 47 mmHg (ref 32.0–48.0)
PH ART: 7.351 (ref 7.350–7.450)
PO2 ART: 194 mmHg — AB (ref 83.0–108.0)
PO2 ART: 147 mmHg — AB (ref 83.0–108.0)
Patient temperature: 36.2
TCO2: 28 mmol/L (ref 22–32)
TCO2: 28 mmol/L (ref 22–32)
TCO2: 28 mmol/L (ref 22–32)
pCO2 arterial: 47.3 mmHg (ref 32.0–48.0)
pH, Arterial: 7.349 — ABNORMAL LOW (ref 7.350–7.450)
pH, Arterial: 7.351 (ref 7.350–7.450)
pO2, Arterial: 276 mmHg — ABNORMAL HIGH (ref 83.0–108.0)

## 2017-04-25 LAB — GLUCOSE, CAPILLARY
GLUCOSE-CAPILLARY: 114 mg/dL — AB (ref 65–99)
GLUCOSE-CAPILLARY: 140 mg/dL — AB (ref 65–99)
GLUCOSE-CAPILLARY: 112 mg/dL — AB (ref 65–99)
Glucose-Capillary: 110 mg/dL — ABNORMAL HIGH (ref 65–99)
Glucose-Capillary: 116 mg/dL — ABNORMAL HIGH (ref 65–99)
Glucose-Capillary: 132 mg/dL — ABNORMAL HIGH (ref 65–99)

## 2017-04-25 LAB — RENAL FUNCTION PANEL
ALBUMIN: 1.8 g/dL — AB (ref 3.5–5.0)
Anion gap: 7 (ref 5–15)
BUN: 14 mg/dL (ref 6–20)
CALCIUM: 7.8 mg/dL — AB (ref 8.9–10.3)
CO2: 25 mmol/L (ref 22–32)
Chloride: 100 mmol/L — ABNORMAL LOW (ref 101–111)
Creatinine, Ser: 1.81 mg/dL — ABNORMAL HIGH (ref 0.61–1.24)
GFR calc Af Amer: 48 mL/min — ABNORMAL LOW (ref >60)
GFR calc non Af Amer: 41 mL/min — ABNORMAL LOW (ref >60)
GLUCOSE: 114 mg/dL — AB (ref 65–99)
PHOSPHORUS: 3.4 mg/dL (ref 2.5–4.6)
Potassium: 4.5 mmol/L (ref 3.5–5.1)
SODIUM: 132 mmol/L — AB (ref 135–145)

## 2017-04-25 LAB — CBC WITH DIFFERENTIAL/PLATELET
BASOS PCT: 0 %
Basophils Absolute: 0 10*3/uL (ref 0.0–0.1)
EOS PCT: 0 %
Eosinophils Absolute: 0 10*3/uL (ref 0.0–0.7)
HEMATOCRIT: 25.1 % — AB (ref 39.0–52.0)
HEMOGLOBIN: 8.1 g/dL — AB (ref 13.0–17.0)
LYMPHS ABS: 1.1 10*3/uL (ref 0.7–4.0)
Lymphocytes Relative: 8 %
MCH: 25 pg — AB (ref 26.0–34.0)
MCHC: 32.3 g/dL (ref 30.0–36.0)
MCV: 77.5 fL — ABNORMAL LOW (ref 78.0–100.0)
MONOS PCT: 10 %
Monocytes Absolute: 1.4 10*3/uL — ABNORMAL HIGH (ref 0.1–1.0)
NEUTROS ABS: 11.8 10*3/uL — AB (ref 1.7–7.7)
Neutrophils Relative %: 82 %
Platelets: 118 10*3/uL — ABNORMAL LOW (ref 150–400)
RBC: 3.24 MIL/uL — ABNORMAL LOW (ref 4.22–5.81)
RDW: 19 % — ABNORMAL HIGH (ref 11.5–15.5)
WBC: 14.3 10*3/uL — ABNORMAL HIGH (ref 4.0–10.5)

## 2017-04-25 LAB — PROTIME-INR
INR: 2.06
PROTHROMBIN TIME: 23.1 s — AB (ref 11.4–15.2)

## 2017-04-25 LAB — MAGNESIUM: Magnesium: 2.5 mg/dL — ABNORMAL HIGH (ref 1.7–2.4)

## 2017-04-25 LAB — PHOSPHORUS: PHOSPHORUS: 3.5 mg/dL (ref 2.5–4.6)

## 2017-04-25 LAB — PROCALCITONIN: Procalcitonin: 26.91 ng/mL

## 2017-04-25 LAB — LACTATE DEHYDROGENASE: LDH: 704 U/L — ABNORMAL HIGH (ref 98–192)

## 2017-04-25 LAB — PREPARE RBC (CROSSMATCH)

## 2017-04-25 MED ORDER — SODIUM CHLORIDE 0.9 % IV SOLN
Freq: Once | INTRAVENOUS | Status: AC
  Administered 2017-04-25: 10:00:00 via INTRAVENOUS

## 2017-04-25 NOTE — Progress Notes (Signed)
Patient ID: David [name], male   DOB: 12-May-1965, 52 y.o.   MRN: [id number] HeartMate 3 Rounding Note  Subjective:    Remains on vent with light sedation using Precedex. Oxygenation improved and now down on 50% FiO2.  Hemodynamics have been more stable with MAP now in the 90's and able to wean down Levophed.  Remains on CVVH with minimal urine output. Now able to remove 100 cc per hr.  PA 46/24 CVP 14 Co-ox 79 this am with arterial PO2 of 276 on 80% FiO2  LVAD INTERROGATION:  HeartMate IIl LVAD:  Flow 4.6 liters/min, speed 5300, power 3.9, PI 3.5. No PI events for past 24 hrs.  Objective:    Vital Signs:   Temp:  [96.1 F (35.6 C)-98.4 F (36.9 C)] 96.1 F (35.6 C) (01/01 1200) Pulse Rate:  [79-111] 82 (01/01 1205) Resp:  [22-28] 22 (01/01 1200) BP: (62-103)/(49-85) 103/85 (01/01 1200) Arterial Line BP: (58-117)/(47-87) 103/85 (01/01 1200) FiO2 (%):  [50 %-80 %] 50 % (01/01 1205) Weight:  [74.2 kg (163 lb 9.3 oz)] 74.2 kg (163 lb 9.3 oz) (01/01 0600) Last BM Date: 04/18/17 Mean arterial Pressure 90's  Intake/Output:   Intake/Output Summary (Last 24 hours) at 04/25/2017 1236 Last data filed at 04/25/2017 1200 Gross per 24 hour  Intake 3313.7 ml  Output 3590 ml  Net -276.3 ml     Physical Exam: General:  Sedated and calm on vent HEENT: ETT and OG Cor: Normal heart sounds with LVAD hum present. Lungs: sound better, crackles in bases Chest incision healing well Abdomen: soft, nontender, nondistended. No bowel sounds. Abdominal incision healing well. Extremities: anasarca Neuro: wakes up and follows commands, recognizes family. Denies pain according to nurse.  Telemetry: atrial fib 80's  Labs: Basic Metabolic Panel: Recent Labs  Lab 04/21/17 1622  04/22/17 0356  04/23/17 0336 04/23/17 1558 04/24/17 0446 04/24/17 0816 04/24/17 1525 04/25/17 0359  NA  --    < > 129*   < > 126* 129* 132* 131* 132* 133*  K  --    < > 4.6   < > 3.8 4.7 4.4 4.5 4.4 4.5  CL  --     < > 99*  --  95* 98* 99* 98* 100* 101  CO2  --   --  19*  --  19* _0 GLUCOSE  --    < > 109*   < > 129* 107* 119* 109* 123* 123*  BUN  --    < > 21*  --  36* 32* 23* 24* 20 19  CREATININE 1.54*   < > 2.35*  --  3.34* 3.13* 2.41* 2.44* 2.26* 1.99*  CALCIUM  --   --  8.3*  --  7.8* 7.5* 7.4* 7.1* 7.2* 7.6*  MG 1.9  --  1.8  --  2.5*  --  2.3  --   --  2.5*  PHOS  --   --  4.4  --  5.5* 6.5* 3.9  3.9  --  3.2 3.5   < > = values in this interval not displayed.    Liver Function Tests: Recent Labs  Lab 04/21/17 0259 04/22/17 0356 04/23/17 0336 04/23/17 1558 04/24/17 0446 04/24/17 0816 04/24/17 1525 04/25/17 0359  AST 95* 171* 319*  --   --  256*  --  223*  ALT 19 77* 208*  --   --  212*  --  201*  ALKPHOS 61 75 95  --   --  128*  --  152*  BILITOT 4.0* 7.4* 9.6*  --   --  10.4*  --  9.8*  PROT 5.6* 5.8* 5.7*  --   --  6.0*  --  6.0*  ALBUMIN 3.0* 2.5* 2.2* 2.1* 2.0* 1.9* 1.8* 1.8*   No results for input(s): LIPASE, AMYLASE in the last 168 hours. No results for input(s): AMMONIA in the last 168 hours.  CBC: Recent Labs  Lab 04/21/17 0259 04/21/17 1622  04/22/17 0356 04/22/17 1701 04/23/17 0336 04/23/17 2241 04/24/17 0446 04/25/17 0359  WBC 18.5* 28.9*  --  36.9*  --  34.7*  --  19.9* 14.3*  NEUTROABS 15.5*  --   --  32.8*  --  31.9*  --  17.5* 11.8*  HGB 8.9* 8.6*   < > 8.4* 10.2* 8.8* 8.3* 9.2* 8.1*  HCT 27.6* 26.8*   < > 25.9* 30.0* 26.4* 25.4* 28.1* 25.1*  MCV 77.3* 77.2*  --  76.9*  --  77.0*  --  77.4* 77.5*  PLT 177 154  --  156  --  143*  --  127* 118*   < > = values in this interval not displayed.    INR: Recent Labs  Lab 04/21/17 0259 04/22/17 0356 04/23/17 0336 04/24/17 0446 04/25/17 0359  INR 1.34 1.52 1.73 1.95 2.06    Other results:  EKG:   Imaging: Dg Chest Port 1 View  Result Date: 04/25/2017 CLINICAL DATA:  Left ventricular assist device EXAM: PORTABLE CHEST 1 VIEW COMPARISON:  04/24/2017 FINDINGS: Endotracheal tube in good  position. Swan-Ganz catheter remains kinked in the main pulmonary artery. Right subclavian central venous catheter tip in the lower SVC unchanged. NG in the stomach. Left chest tube remains in place.  No pneumothorax. Left ventricular assist device unchanged.  AICD unchanged. Negative for pneumothorax. Bilateral airspace disease consistent with edema shows mild improvement. Left lower lobe atelectasis unchanged IMPRESSION: Support lines remain in good position.  No pneumothorax Mild improvement pulmonary edema. Electronically Signed   By: Franchot Gallo M.D.   On: 04/25/2017 08:04   Dg Chest Port 1 View  Result Date: 04/24/2017 CLINICAL DATA:  Status post aortic valve repair and insertion of a left ventricular assist device 4 days ago. EXAM: PORTABLE CHEST 1 VIEW COMPARISON:  Portable chest x-ray of April 23, 2017 FINDINGS: The lungs are adequately inflated. The interstitial markings are increased but fairly stable. There is no alveolar infiltrate. There is no pleural effusion or pneumothorax. The cardiac silhouette remains enlarged. The pulmonary vascularity is engorged but slightly more distinct today. The endotracheal tube tip projects 3.5 cm above the carina. The esophagogastric tube tip and proximal port project in the gastric cardia. The right subclavian venous catheter tip projects over the junction of the middle and distal thirds of the SVC. The Swan-Ganz catheter tip projects over proximal left pulmonary artery branch. The left ventricular assist device is in stable position. The ICD is in stable position. The 2 left-sided chest tubes are in stable position. The sternal wires are intact. IMPRESSION: CHF with moderate pulmonary interstitial edema perhaps slightly improved today. The support devices are in stable position. Electronically Signed   By: David  Martinique M.D.   On: 04/24/2017 07:37   Dg Chest Port 1 View  Result Date: 04/23/2017 CLINICAL DATA:  Encounter for intubation. Acute  respiratory failure . Ischemic cardiomyopathy. Postop from aortic valve repair and left ventricular assist device. EXAM: PORTABLE CHEST 1 VIEW COMPARISON:  Prior today FINDINGS: New endotracheal tube and  nasogastric tube are seen in appropriate position. Other support lines and tubes are unchanged in position. Swan-Ganz catheter is again seen with tip in the proximal right pulmonary artery. Right chest tube remains in place and no pneumothorax is visualized. Stable marked cardiac enlargement. Stable diffuse bilateral pulmonary airspace disease, most likely due to pulmonary edema. IMPRESSION: New endotracheal tube and nasogastric tube in appropriate position. Stable cardiomegaly and diffuse bilateral airspace disease. Electronically Signed   By: Earle Gell M.D.   On: 04/23/2017 17:47      Medications:     Scheduled Medications: . aspirin  325 mg Per Tube Daily  . bisacodyl  10 mg Rectal Daily   Or  . bisacodyl  10 mg Oral Daily  . chlorhexidine gluconate (MEDLINE KIT)  15 mL Mouth Rinse BID  . Chlorhexidine Gluconate Cloth  6 each Topical Daily  . insulin aspart  0-24 Units Subcutaneous Q4H  . levalbuterol  1.25 mg Nebulization Q6H  . mouth rinse  15 mL Mouth Rinse 10 times per day  . pantoprazole (PROTONIX) IV  40 mg Intravenous Q24H  . sodium chloride flush  10-40 mL Intracatheter Q12H  . sodium chloride flush  3 mL Intravenous Q12H  . Warfarin - Physician Dosing Inpatient   Does not apply q1800     Infusions: . sodium chloride Stopped (04/24/17 1701)  . sodium chloride Stopped (04/24/17 1701)  . ceFEPime (MAXIPIME) IV Stopped (04/25/17 1141)  . dexmedetomidine (PRECEDEX) IV infusion 0.5 mcg/kg/hr (04/25/17 1200)  . DOBUTamine 5 mcg/kg/min (04/25/17 1200)  . EPINEPHrine 4 mg in dextrose 5% 250 mL infusion (16 mcg/mL) 9.013 mcg/min (04/25/17 1200)  . lactated ringers 10 mL/hr at 04/25/17 1200  . norepinephrine (LEVOPHED) Adult infusion 26.027 mcg/min (04/25/17 1200)  . dialysis  replacement fluid (prismasate) 400 mL/hr at 04/25/17 0055  . dialysis replacement fluid (prismasate) 200 mL/hr at 04/24/17 1300  . dialysate (PRISMASATE) 1,700 mL/hr at 04/25/17 1204  . sodium chloride    . vasopressin (PITRESSIN) infusion - *FOR SHOCK* 0.06 Units/min (04/25/17 1200)     PRN Medications:  sodium chloride, heparin, morphine injection, ondansetron (ZOFRAN) IV, oxyCODONE, sodium chloride, sodium chloride flush, sodium chloride flush, traMADol   Assessment:   1. Ischemic cardiomyopathy s/p multiple PCI's with chronic systolic heart failure and EF 20% on home dobutamine preop with significant improvement in symptoms. Evaluated at [facility] for heart transplant but LVAD felt to be the best treatment for him at this time. POD5s/p HeartMate III LVAD. He is critically ill with multi-system organ failure. MAP is much better today and able to wean Levophed, remove volume with CRRT. 2. H/O Hypertension 3. H/OHyperlipidemia 4. PAF in sinuspreopon amiodarone and Eliquis.Now back in atrial fib. Amio stopped.  5. H/O Stage III CKD. Acute on chronic postop renal failure with anuria now on CRRT.  6. LBBB s/p CRT-D upgrade in 10/2016 without improvement in symptoms or EF. LV epicardial leads cut off at the time of surgery. 7. Prior smoking with mild obstruction and severe diffusion defect on PFT's. CXRshowsedema or ALI but slightly improved. Oxygenation is improving and now down on 50%. 8. Postop leukocytosis and fever. I suspect this is inflammatory this early postopbut with his complicated course he was started antibiotics because his risk of pneumonia is higher. CXR shows diffuse interstitial air space disease that could be edema or acute lung injury. Leukocytosis continues to improve and remains afebrile. PCT was 27 this am but I am not sure what that means in his case. All  cultures negative so far.  9. Marked rise in bilirubin and transaminases. Likely due to pump runand right sided  congestion, hypotension.stable  10. INR is 2.06 today after one small dose of Coumadin a couple days ago. Hold off on anticoagulation for now. LDH is down to 704 but this is due to liver dysfunction at this point.   I discussed status with wife at bedside.     Plan/Discussion:    Overall he looks much better today. Plan to wean Levophed and when less than 10 mcg can start to wean vasopressin slowly. Continue epi and dobutamine.  Continue to remove volume as tolerated  Remove chest tubes  Remove foley and culture urine if any.  Remove swan. Will need to place a new line and remove sleeve probably tomorrow.  No Coumadin today.  Will need to consider tube feeds tomorrow. Will see what CXR looks like and if he can wean on vent.  Continue Maxipime.  I reviewed the LVAD parameters from today, and compared the results to the patient's prior recorded data.  No programming changes were made.  The LVAD is functioning within specified parameters.  LVAD interrogation was negative for any significant power changes, alarms or PI events/speed drops.  LVAD equipment check completed and is in good working order.  Back-up equipment present.   LVAD education done on emergency procedures and precautions and reviewed exit site care.  Length of Stay: 6  Gaye Pollack 04/25/2017, 12:36 PM

## 2017-04-25 NOTE — Progress Notes (Signed)
Patient ID: David [name], male   DOB: Sep 08, 1965, 52 y.o.   MRN: [id number]   Advanced Heart Failure VAD Team Note  Subjective:    David [name] is a 52 y.o. male with a PMH of ICM ('09 PCI to LAD, mRCA, '10 PCI to Lcx and '12 mRCA), HFrEF (EF ~20% for >5 years), HLD and HTN admitted 12/26 for VAD placement due to end-stage systolic HF.   Underwent successful HM-3 LVAD placement 04/20/17.  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   Extubated 12/28.  Epinephrine stopped but then UOP and co-ox dropped so restarted.  MAP lower when sitting in chair yesterday so norepinephrine restarted 12/29.  Poor UOP despite Lasix gtt and creatinine continues to rise, up to 3.34.    Nephrology consulted 04/23/17. Femoral line placed and CVVH begun.   Intubated 04/23/17 with worsening acidosis and resp distress on Bipap. Developed progressive shock with uptitration of norepinephrine and epinephrine and addition of vasopressin.  Milrinone stopped and dobutamine begun.  CXR with CHF.    Today, he appears more stable.  MAP in the 70s-80s now.  Minimal UOP.  CVVH running even.  He is afebrile, blood cultures NGTD and WBCs falling.  CVP 11-12.   LDH 407 -> 576 -> 923 -> 967 -> 704 INR 1.34 -> 1.52 -> 1.73 -> 1.95 -> 2.06  Afebrile. WBC down to 19.9. CXR with pulmonary edema.  LFTs elevated with tbili 9.6 04/23/17  Swan numbers reviewed CVP 11 PA 38/23 CI 3.0 Co-ox 79%  Echo 12/31 reviewed: No significant pericardial effusion, IV septum midline to slightly to the right, RV function adequate.   LVAD INTERROGATION:  HeartMate III LVAD:  Flow 4.6 liters/min, speed 5300, power 3.9, PI 3.5. No PI events/24 hrs  Objective:    Vital Signs:   Temp:  [97 F (36.1 C)-98.6 F (37 C)] 97 F (36.1 C) (01/01 0700) Pulse Rate:  [91-111] 111 (01/01 0430) Resp:  [22-32] 22 (01/01 0700) BP: (61-69)/(47-56) 69/56 (12/31 1605) Arterial Line BP: (57-117)/(47-72) 87/70 (01/01 0700) FiO2 (%):  [60  %-80 %] 60 % (01/01 0430) Weight:  [163 lb 9.3 oz (74.2 kg)] 163 lb 9.3 oz (74.2 kg) (01/01 0600) Last BM Date: 04/18/17 Mean arterial Pressure 70s-80s  Intake/Output:   Intake/Output Summary (Last 24 hours) at 04/25/2017 0735 Last data filed at 04/25/2017 0700 Gross per 24 hour  Intake 2918.21 ml  Output 3040 ml  Net -121.79 ml     Physical Exam    GENERAL: Intubated/sedated. NAD.  HEENT: Normal. NECK: Right IJ Swan. JVP 10 cm.  CARDIAC:  Mechanical heart sounds with LVAD hum present.  LUNGS:  Dependent crackles.  ABDOMEN:  ND, no HSM. No bruits or masses. +BS  LVAD exit site: Well-healed and incorporated. Dressing dry and intact. No erythema or drainage. Stabilization device present and accurately applied. Driveline dressing changed daily per sterile technique. EXTREMITIES:  Warm and dry. No cyanosis, clubbing, rash, or edema.  NEUROLOGIC:  Sedated but awakens and follows commands.    Telemetry   Afib, Rates 80-90s, personally reviewed.   Labs   Basic Metabolic Panel: Recent Labs  Lab 04/21/17 1622  04/22/17 0356  04/23/17 0336 04/23/17 1558 04/24/17 0446 04/24/17 0816 04/24/17 1525 04/25/17 0359  NA  --    < > 129*   < > 126* 129* 132* 131* 132* 133*  K  --    < > 4.6   < > 3.8 4.7 4.4 4.5 4.4 4.5  CL  --    < >  99*  --  95* 98* 99* 98* 100* 101  CO2  --   --  19*  --  19* _0 GLUCOSE  --    < > 109*   < > 129* 107* 119* 109* 123* 123*  BUN  --    < > 21*  --  36* 32* 23* 24* 20 19  CREATININE 1.54*   < > 2.35*  --  3.34* 3.13* 2.41* 2.44* 2.26* 1.99*  CALCIUM  --   --  8.3*  --  7.8* 7.5* 7.4* 7.1* 7.2* 7.6*  MG 1.9  --  1.8  --  2.5*  --  2.3  --   --  2.5*  PHOS  --   --  4.4  --  5.5* 6.5* 3.9  3.9  --  3.2 3.5   < > = values in this interval not displayed.    Liver Function Tests: Recent Labs  Lab 04/21/17 0259 04/22/17 0356 04/23/17 0336 04/23/17 1558 04/24/17 0446 04/24/17 0816 04/24/17 1525 04/25/17 0359  AST 95* 171* 319*  --    --  256*  --  223*  ALT 19 77* 208*  --   --  212*  --  201*  ALKPHOS 61 75 95  --   --  128*  --  152*  BILITOT 4.0* 7.4* 9.6*  --   --  10.4*  --  9.8*  PROT 5.6* 5.8* 5.7*  --   --  6.0*  --  6.0*  ALBUMIN 3.0* 2.5* 2.2* 2.1* 2.0* 1.9* 1.8* 1.8*   No results for input(s): LIPASE, AMYLASE in the last 168 hours. No results for input(s): AMMONIA in the last 168 hours.  CBC: Recent Labs  Lab 04/21/17 0259 04/21/17 1622  04/22/17 0356 04/22/17 1701 04/23/17 0336 04/23/17 2241 04/24/17 0446 04/25/17 0359  WBC 18.5* 28.9*  --  36.9*  --  34.7*  --  19.9* 14.3*  NEUTROABS 15.5*  --   --  32.8*  --  31.9*  --  17.5* PENDING  HGB 8.9* 8.6*   < > 8.4* 10.2* 8.8* 8.3* 9.2* 8.1*  HCT 27.6* 26.8*   < > 25.9* 30.0* 26.4* 25.4* 28.1* 25.1*  MCV 77.3* 77.2*  --  76.9*  --  77.0*  --  77.4* 77.5*  PLT 177 154  --  156  --  143*  --  127* PENDING   < > = values in this interval not displayed.    INR: Recent Labs  Lab 04/21/17 0259 04/22/17 0356 04/23/17 0336 04/24/17 0446 04/25/17 0359  INR 1.34 1.52 1.73 1.95 2.06    Other results:  Imaging   Dg Chest Port 1 View  Result Date: 04/24/2017 CLINICAL DATA:  Status post aortic valve repair and insertion of a left ventricular assist device 4 days ago. EXAM: PORTABLE CHEST 1 VIEW COMPARISON:  Portable chest x-ray of April 23, 2017 FINDINGS: The lungs are adequately inflated. The interstitial markings are increased but fairly stable. There is no alveolar infiltrate. There is no pleural effusion or pneumothorax. The cardiac silhouette remains enlarged. The pulmonary vascularity is engorged but slightly more distinct today. The endotracheal tube tip projects 3.5 cm above the carina. The esophagogastric tube tip and proximal port project in the gastric cardia. The right subclavian venous catheter tip projects over the junction of the middle and distal thirds of the SVC. The Swan-Ganz catheter tip projects over proximal left pulmonary artery  branch. The  left ventricular assist device is in stable position. The ICD is in stable position. The 2 left-sided chest tubes are in stable position. The sternal wires are intact. IMPRESSION: CHF with moderate pulmonary interstitial edema perhaps slightly improved today. The support devices are in stable position. Electronically Signed   By: David  Martinique M.D.   On: 04/24/2017 07:37   Dg Chest Port 1 View  Result Date: 04/23/2017 CLINICAL DATA:  Encounter for intubation. Acute respiratory failure . Ischemic cardiomyopathy. Postop from aortic valve repair and left ventricular assist device. EXAM: PORTABLE CHEST 1 VIEW COMPARISON:  Prior today FINDINGS: New endotracheal tube and nasogastric tube are seen in appropriate position. Other support lines and tubes are unchanged in position. Swan-Ganz catheter is again seen with tip in the proximal right pulmonary artery. Right chest tube remains in place and no pneumothorax is visualized. Stable marked cardiac enlargement. Stable diffuse bilateral pulmonary airspace disease, most likely due to pulmonary edema. IMPRESSION: New endotracheal tube and nasogastric tube in appropriate position. Stable cardiomegaly and diffuse bilateral airspace disease. Electronically Signed   By: Earle Gell M.D.   On: 04/23/2017 17:47   Dg Chest Port 1 View  Result Date: 04/23/2017 CLINICAL DATA:  Encounter for central line placement EXAM: PORTABLE CHEST 1 VIEW COMPARISON:  04/23/2017 FINDINGS: Right subclavian central venous catheter tip is at the cavoatrial junction. Swan-Ganz catheter is identified with tip looped in the expected location of the main pulmonary artery. There is a left chest wall ICD with lead in the right atrial appendage and right ventricle. LVAD device appears stable in position from previous exam. Previous median sternotomy and CABG procedure. Stable cardiac enlargement and pulmonary edema. IMPRESSION: 1. Postsurgical changes are stable compared with previous  exam. 2. No change in pulmonary edema pattern. Electronically Signed   By: Kerby Moors M.D.   On: 04/23/2017 10:19     Medications:     Scheduled Medications: . aspirin  325 mg Per Tube Daily  . bisacodyl  10 mg Rectal Daily   Or  . bisacodyl  10 mg Oral Daily  . chlorhexidine gluconate (MEDLINE KIT)  15 mL Mouth Rinse BID  . Chlorhexidine Gluconate Cloth  6 each Topical Daily  . insulin aspart  0-24 Units Subcutaneous Q4H  . levalbuterol  1.25 mg Nebulization Q6H  . mouth rinse  15 mL Mouth Rinse 10 times per day  . pantoprazole (PROTONIX) IV  40 mg Intravenous Q24H  . sodium chloride flush  10-40 mL Intracatheter Q12H  . sodium chloride flush  3 mL Intravenous Q12H  . Warfarin - Physician Dosing Inpatient   Does not apply q1800    Infusions: . sodium chloride Stopped (04/24/17 1701)  . sodium chloride Stopped (04/24/17 1701)  . sodium chloride    . ceFEPime (MAXIPIME) IV Stopped (04/25/17 0000)  . dexmedetomidine (PRECEDEX) IV infusion 0.5 mcg/kg/hr (04/25/17 0700)  . DOBUTamine 5 mcg/kg/min (04/25/17 0700)  . EPINEPHrine 4 mg in dextrose 5% 250 mL infusion (16 mcg/mL) 10 mcg/min (04/25/17 0700)  . lactated ringers 10 mL/hr at 04/25/17 0700  . norepinephrine (LEVOPHED) Adult infusion 40 mcg/min (04/25/17 0700)  . dialysis replacement fluid (prismasate) 400 mL/hr at 04/25/17 0055  . dialysis replacement fluid (prismasate) 200 mL/hr at 04/24/17 1300  . dialysate (PRISMASATE) 1,700 mL/hr at 04/25/17 0300  . sodium chloride    . vasopressin (PITRESSIN) infusion - *FOR SHOCK* 0.06 Units/min (04/25/17 0700)    PRN Medications: sodium chloride, heparin, morphine injection, ondansetron (ZOFRAN) IV, oxyCODONE, sodium  chloride, sodium chloride flush, sodium chloride flush, traMADol   Patient Profile   David [name] is a 52 y.o. male with a PMH of ICM ('09 PCI to LAD, mRCA, '10 PCI to Lcx and '12 mRCA), HFrEF (EF ~20% for >5 years) s/p HM-III VAD placement on 12/27 due to  end-stage systolic HF.   Assessment/Plan:    1. Acute on chronic systolic HF: Echo 56/31/49 with LVEF 20-25%, Mild MR, Mild/Mod MR, Severe LAE, Mildly reduced RV, PA peak pressure 56 mm Hg.  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 Extubated, initially stopped epinephrine/norepinephrine but restarted with fall in UOP and MAP.  With progressive renal failure, pulmonary edema, and hypotension, he was re-intubated on 12/30.   CVVH was begun.  12/31 was difficult day with refractory shock (MAP in 60s).  Today, he is improved with MAP in 70s-80s on epinephrine 10, norepinephrine 40, vasopressin 0.06, and dobutamine 5.  I reviewed 12/31 echo which showed no significant pericardial effusion, adequate RV function, and IV septum near mid-line. Given suspected for vasodilatory/septic shock with PCT 31 yesterday, he is covered with cefepime.  Cardiac output is adequate with co-ox 79% this morning. CVP 11-12 with pulmonary edema on CXR.  - With stable MAP, will start UF via CVVH this morning.  Start with 50 cc/hr and titrate up watching MAP.    - Continue current pressor/inotrope support for now.  - Decrease ASA to 81 daily  - Warfarin goal INR 2-2.5, 2.06 today.  - Remove Swan today.  2. CAD s/p PCI to mid RCA and PLOM with DES x 2 and DES to mid LAD in 1/18.   - Continue ASA and statin. No change.  3. PAF: Suspect atrial fibrillation but rate is controlled. He is on po amiodarone but do not think this caused rise in LFTs.  No change.  4. ARF on CKD stage III: Suspect some degree of intrinsic renal disease prior to LVAD placement (baseline creatinine around 1.5) with probable ATN from intra-op/peri-op hypotension and development of vasodilatory/septic shock.   - Now on CVVH, as above aim for UF 50 cc/hr this am to start with improved MAP.  5. ABLA, expected: Got 1 unit PRBCs on 12/30.  Hgb 8.1 this morning, will transfuse 1 unit.  6. ID: Started on cefepime empirically with  septic/vasodilatory shock, PCT was 31 yesterday.  Cultures (blood, sputum) NGTD.  WBCs trending down to 14 today and MAP improved. 7. Elevated LFTs: tbili to 9.8.  Suspect shock liver, should improve with better MAP and fluid off.  CRITICAL CARE Performed by: Loralie Champagne  Total critical care time: 40 minutes  Critical care time was exclusive of separately billable procedures and treating other patients.  Critical care was necessary to treat or prevent imminent or life-threatening deterioration.  Critical care was time spent personally by me on the following activities: development of treatment plan with patient and/or surrogate as well as nursing, discussions with consultants, evaluation of patient's response to treatment, examination of patient, obtaining history from patient or surrogate, ordering and performing treatments and interventions, ordering and review of laboratory studies, ordering and review of radiographic studies, pulse oximetry and re-evaluation of patient's condition.   Length of Stay: 6  Loralie Champagne, MD 04/25/2017, 7:35 AM  VAD Team --- VAD ISSUES ONLY--- Pager [phone] (7am - 7am)  Advanced Heart Failure Team  Pager [phone] (M-F; 7a - 4p)  Please contact [facility] for night-coverage after hours (4p -7a ) and  weekends on [email or web address]

## 2017-04-25 NOTE — Progress Notes (Signed)
[facility] Rounding Note Background: 52 yo WM PMH HTN, HLD, ICM with EF 20-25% previously home inotropes,  Rec'd Heartmate III 04/20/17.  AoV sewed shut 2/2 mod AI. Destination therapy with possible bridge to transplant (type O blood which will make wait list time long). Pre-op creatinine 1.3-1.4. Creatinine rose abruptly post VAD to 3.34 with oliguria. No evidence sig hemolysis or thrombosis. CRRT initiated 04/23/17 for pulmonary edema/RF.  CRRT (12/30) Keeping even->this AM able to pull neg 50/hour R [place] temp cath (12/30) All 4K fluids No heparin Effluent dose 33 ml/kg/hour No clotting issues   Assessment/Recommendations  1. Oliguric AKI on CKD Stage III: Likely hemodynamic hypoperfusion injury.  Can not exclude some element pigment nephropathy (UA large blood, only 6-30 RBC's). No VAD clotting issues so suspicion of thromboembolic issue/renal infarct low, and though LDH had been increasing, down some today. CRRT initiated 12/30. Able to tolerate fluid removal this AM 50/hour w/improved MAP of 90. K and phos OK. Can consider addition of albumin for mobilization 3rd spp fluid (serum alb 1.8) but I have not ordered yet 2. ICM s/p VAD for end stage systolic HF.   09/73 s/p Heartmate III.  Pump speed 5300 Flow 4.6 L/min.  3. Shock - felt vasodilatory/septic rather than cardiogenic. Max epi/NE/dobutamine/vasopressin. Improved MAP 4. Leukocytosis - WBC continues to improve. Pending cultures (so far BC's/resp cx negative) On empiric cefepime only  (preop rec'd vanco, cefuroxime, rifampin and fluconazole)  5. PAF AF Amio 6. VDRF Re-intubated 12/30 pulm edema/resp acidosis. Mild imp pulm edema CXR today. Pulling fluid now  7. Shock liver:  Congestive hepatopathy +/- hypoperfusion injury.   Subjective/Interval History:  Reintubated 12/30 d/t worsening resp status Maxed out on pressors Has shown improvement in MAP so pulling some fluid this AM Minimal UOP dark muddy brown  Objective Vital signs in last  24 hours: Vitals:   04/25/17 0600 04/25/17 0700 04/25/17 0737 04/25/17 0800  BP:   95/75   Pulse:   83 94  Resp: (!) 22 (!) 22 (!) 23 (!) 22  Temp: (!) 97.5 F (36.4 C) (!) 97 F (36.1 C)  (!) 97.2 F (36.2 C)  TempSrc:      Weight: 74.2 kg (163 lb 9.3 oz)     Height:       Weight change: -0.2 kg (-7.1 oz)  Intake/Output Summary (Last 24 hours) at 04/25/2017 0833 Last data filed at 04/25/2017 0800 Gross per 24 hour  Intake 2952.21 ml  Output 3147 ml  Net -194.79 ml   Physical Exam:  Blood pressure 95/75, pulse 94, temperature (!) 97.2 F (36.2 C), resp. rate (!) 22, height _0  (1.651 m), weight 74.2 kg (163 lb 9.3 oz), SpO2 90 %.  Ill app WM Intubated, sedated R IJ swan, R [place] temp HD cath, chest tube(s), foley, VAD, orotracheal and orogastric tubes MAP 91 CVP 11 Sclerae icteric Ant fairly clear, crackles posteriorly Mechanical heart sounds, VAD hum LVAD exit site dressing intact.  Abd tight, no BS heard 1+ dependent edema LE's Foley dark yellow brown urine 40 ml this A<   Recent Labs  Lab 04/21/17 0259  04/22/17 0356 04/22/17 1701 04/23/17 0336 04/23/17 1558 04/24/17 0446 04/24/17 0816 04/24/17 1525 04/25/17 0359  NA 134*   < > 129* 132* 126* 129* 132* 131* 132* 133*  K 4.5   < > 4.6 4.0 3.8 4.7 4.4 4.5 4.4 4.5  CL 103   < > 99*  --  95* 98* 99* 98* 100* 101  CO2 21*  --  19*  --  19* _0 GLUCOSE 188*   < > 109* 137* 129* 107* 119* 109* 123* 123*  BUN 13   < > 21*  --  36* 32* 23* 24* 20 19  CREATININE 1.60*   < > 2.35*  --  3.34* 3.13* 2.41* 2.44* 2.26* 1.99*  CALCIUM 8.2*  --  8.3*  --  7.8* 7.5* 7.4* 7.1* 7.2* 7.6*  PHOS 3.1  --  4.4  --  5.5* 6.5* 3.9  3.9  --  3.2 3.5   < > = values in this interval not displayed.    Recent Labs  Lab 04/23/17 0336  04/24/17 0816 04/24/17 1525 04/25/17 0359  AST 319*  --  256*  --  223*  ALT 208*  --  212*  --  201*  ALKPHOS 95  --  128*  --  152*  BILITOT 9.6*  --  10.4*  --  9.8*  PROT 5.7*  --   6.0*  --  6.0*  ALBUMIN 2.2*   < > 1.9* 1.8* 1.8*   < > = values in this interval not displayed.    Recent Labs  Lab 04/22/17 0356  04/23/17 0336 04/23/17 2241 04/24/17 0446 04/25/17 0359  WBC 36.9*  --  34.7*  --  19.9* 14.3*  NEUTROABS 32.8*  --  31.9*  --  17.5* PENDING  HGB 8.4*   < > 8.8* 8.3* 9.2* 8.1*  HCT 25.9*   < > 26.4* 25.4* 28.1* 25.1*  MCV 76.9*  --  77.0*  --  77.4* 77.5*  PLT 156  --  143*  --  127* PENDING   < > = values in this interval not displayed.    Recent Labs  Lab 04/24/17 1539 04/24/17 2013 04/25/17 0008 04/25/17 0405 04/25/17 0812  GLUCAP 123* 126* 116* 132* 114*    Results for CRIAG, WICKLUND (MRN [id number]) as of 04/25/2017 08:35  04/21/2017 02:59 04/22/2017 03:56 04/23/2017 03:36 04/24/2017 04:46 04/25/2017 03:59  LDH 407 (H) 576 (H) 923 (H) 967 (H) 704 (H)    Studies/Results: Dg Chest Port 1 View  Result Date: 04/25/2017 CLINICAL DATA:  Left ventricular assist device EXAM: PORTABLE CHEST 1 VIEW COMPARISON:  04/24/2017 FINDINGS: Endotracheal tube in good position. Swan-Ganz catheter remains kinked in the main pulmonary artery. Right subclavian central venous catheter tip in the lower SVC unchanged. NG in the stomach. Left chest tube remains in place.  No pneumothorax. Left ventricular assist device unchanged.  AICD unchanged. Negative for pneumothorax. Bilateral airspace disease consistent with edema shows mild improvement. Left lower lobe atelectasis unchanged IMPRESSION: Support lines remain in good position.  No pneumothorax Mild improvement pulmonary edema. Electronically Signed   By: Franchot Gallo M.D.   On: 04/25/2017 08:04   Dg Chest Port 1 View  Result Date: 04/24/2017 CLINICAL DATA:  Status post aortic valve repair and insertion of a left ventricular assist device 4 days ago. EXAM: PORTABLE CHEST 1 VIEW COMPARISON:  Portable chest x-ray of April 23, 2017 FINDINGS: The lungs are adequately inflated. The interstitial markings are  increased but fairly stable. There is no alveolar infiltrate. There is no pleural effusion or pneumothorax. The cardiac silhouette remains enlarged. The pulmonary vascularity is engorged but slightly more distinct today. The endotracheal tube tip projects 3.5 cm above the carina. The esophagogastric tube tip and proximal port project in the gastric cardia. The right subclavian venous catheter tip projects over the  junction of the middle and distal thirds of the SVC. The Swan-Ganz catheter tip projects over proximal left pulmonary artery branch. The left ventricular assist device is in stable position. The ICD is in stable position. The 2 left-sided chest tubes are in stable position. The sternal wires are intact. IMPRESSION: CHF with moderate pulmonary interstitial edema perhaps slightly improved today. The support devices are in stable position. Electronically Signed   By: David  Martinique M.D.   On: 04/24/2017 07:37   Dg Chest Port 1 View  Result Date: 04/23/2017 CLINICAL DATA:  Encounter for intubation. Acute respiratory failure . Ischemic cardiomyopathy. Postop from aortic valve repair and left ventricular assist device. EXAM: PORTABLE CHEST 1 VIEW COMPARISON:  Prior today FINDINGS: New endotracheal tube and nasogastric tube are seen in appropriate position. Other support lines and tubes are unchanged in position. Swan-Ganz catheter is again seen with tip in the proximal right pulmonary artery. Right chest tube remains in place and no pneumothorax is visualized. Stable marked cardiac enlargement. Stable diffuse bilateral pulmonary airspace disease, most likely due to pulmonary edema. IMPRESSION: New endotracheal tube and nasogastric tube in appropriate position. Stable cardiomegaly and diffuse bilateral airspace disease. Electronically Signed   By: Earle Gell M.D.   On: 04/23/2017 17:47   Dg Chest Port 1 View  Result Date: 04/23/2017 CLINICAL DATA:  Encounter for central line placement EXAM: PORTABLE  CHEST 1 VIEW COMPARISON:  04/23/2017 FINDINGS: Right subclavian central venous catheter tip is at the cavoatrial junction. Swan-Ganz catheter is identified with tip looped in the expected location of the main pulmonary artery. There is a left chest wall ICD with lead in the right atrial appendage and right ventricle. LVAD device appears stable in position from previous exam. Previous median sternotomy and CABG procedure. Stable cardiac enlargement and pulmonary edema. IMPRESSION: 1. Postsurgical changes are stable compared with previous exam. 2. No change in pulmonary edema pattern. Electronically Signed   By: Kerby Moors M.D.   On: 04/23/2017 10:19   Medications: . sodium chloride Stopped (04/24/17 1701)  . sodium chloride Stopped (04/24/17 1701)  . sodium chloride    . ceFEPime (MAXIPIME) IV Stopped (04/25/17 0000)  . dexmedetomidine (PRECEDEX) IV infusion 0.5 mcg/kg/hr (04/25/17 0800)  . DOBUTamine 5 mcg/kg/min (04/25/17 0800)  . EPINEPHrine 4 mg in dextrose 5% 250 mL infusion (16 mcg/mL) 10 mcg/min (04/25/17 0800)  . lactated ringers 10 mL/hr at 04/25/17 0800  . norepinephrine (LEVOPHED) Adult infusion 40 mcg/min (04/25/17 0800)  . dialysis replacement fluid (prismasate) 400 mL/hr at 04/25/17 0055  . dialysis replacement fluid (prismasate) 200 mL/hr at 04/24/17 1300  . dialysate (PRISMASATE) 1,700 mL/hr at 04/25/17 0300  . sodium chloride    . vasopressin (PITRESSIN) infusion - *FOR SHOCK* 0.06 Units/min (04/25/17 0800)   . aspirin  325 mg Per Tube Daily  . bisacodyl  10 mg Rectal Daily   Or  . bisacodyl  10 mg Oral Daily  . chlorhexidine gluconate (MEDLINE KIT)  15 mL Mouth Rinse BID  . Chlorhexidine Gluconate Cloth  6 each Topical Daily  . insulin aspart  0-24 Units Subcutaneous Q4H  . levalbuterol  1.25 mg Nebulization Q6H  . mouth rinse  15 mL Mouth Rinse 10 times per day  . pantoprazole (PROTONIX) IV  40 mg Intravenous Q24H  . sodium chloride flush  10-40 mL Intracatheter Q12H   . sodium chloride flush  3 mL Intravenous Q12H  . Warfarin - Physician Dosing Inpatient   Does not apply [phone]  Jamal Maes, MD [facility] [phone] pager 04/25/2017, 8:33 AM

## 2017-04-26 ENCOUNTER — Inpatient Hospital Stay (HOSPITAL_COMMUNITY): Payer: Medicare HMO

## 2017-04-26 DIAGNOSIS — I5023 Acute on chronic systolic (congestive) heart failure: Secondary | ICD-10-CM

## 2017-04-26 DIAGNOSIS — Z95811 Presence of heart assist device: Secondary | ICD-10-CM

## 2017-04-26 LAB — POCT I-STAT 3, ART BLOOD GAS (G3+)
Bicarbonate: 25.2 mmol/L (ref 20.0–28.0)
Bicarbonate: 24.8 mmol/L (ref 20.0–28.0)
Bicarbonate: 23.3 mmol/L (ref 20.0–28.0)
O2 SAT: 98 %
O2 Saturation: 99 %
O2 Saturation: 94 %
PCO2 ART: 38.8 mmHg (ref 32.0–48.0)
PH ART: 7.417 (ref 7.350–7.450)
PH ART: 7.417 (ref 7.350–7.450)
PH ART: 7.452 — AB (ref 7.350–7.450)
PO2 ART: 120 mmHg — AB (ref 83.0–108.0)
TCO2: 26 mmol/L (ref 22–32)
TCO2: 26 mmol/L (ref 22–32)
TCO2: 24 mmol/L (ref 22–32)
pCO2 arterial: 38.4 mmHg (ref 32.0–48.0)
pCO2 arterial: 33.3 mmHg (ref 32.0–48.0)
pO2, Arterial: 97 mmHg (ref 83.0–108.0)
pO2, Arterial: 67 mmHg — ABNORMAL LOW (ref 83.0–108.0)

## 2017-04-26 LAB — BPAM RBC
Blood Product Expiration Date: 201901072359
Blood Product Expiration Date: 201901192359
ISSUE DATE / TIME: 201812310106
ISSUE DATE / TIME: 201901011018
Unit Type and Rh: 5100
Unit Type and Rh: 9500

## 2017-04-26 LAB — TYPE AND SCREEN
ABO/RH(D): O POS
Antibody Screen: NEGATIVE
UNIT DIVISION: 0
Unit division: 0

## 2017-04-26 LAB — CBC WITH DIFFERENTIAL/PLATELET
Basophils Absolute: 0 10*3/uL (ref 0.0–0.1)
Basophils Relative: 0 %
Eosinophils Absolute: 0.1 10*3/uL (ref 0.0–0.7)
Eosinophils Relative: 1 %
HCT: 26.9 % — ABNORMAL LOW (ref 39.0–52.0)
Hemoglobin: 8.9 g/dL — ABNORMAL LOW (ref 13.0–17.0)
Lymphocytes Relative: 8 %
Lymphs Abs: 1.1 10*3/uL (ref 0.7–4.0)
MCH: 25.9 pg — ABNORMAL LOW (ref 26.0–34.0)
MCHC: 33.1 g/dL (ref 30.0–36.0)
MCV: 78.4 fL (ref 78.0–100.0)
Monocytes Absolute: 1.1 10*3/uL — ABNORMAL HIGH (ref 0.1–1.0)
Monocytes Relative: 8 %
NEUTROS PCT: 83 %
Neutro Abs: 11.2 10*3/uL — ABNORMAL HIGH (ref 1.7–7.7)
PLATELETS: 114 10*3/uL — AB (ref 150–400)
RBC: 3.43 MIL/uL — AB (ref 4.22–5.81)
RDW: 19.5 % — ABNORMAL HIGH (ref 11.5–15.5)
WBC: 13.5 10*3/uL — AB (ref 4.0–10.5)

## 2017-04-26 LAB — COMPREHENSIVE METABOLIC PANEL
ALK PHOS: 177 U/L — AB (ref 38–126)
ALT: 187 U/L — AB (ref 17–63)
AST: 201 U/L — AB (ref 15–41)
Albumin: 1.8 g/dL — ABNORMAL LOW (ref 3.5–5.0)
Anion gap: 7 (ref 5–15)
BUN: 15 mg/dL (ref 6–20)
CALCIUM: 7.9 mg/dL — AB (ref 8.9–10.3)
CO2: 24 mmol/L (ref 22–32)
CREATININE: 1.88 mg/dL — AB (ref 0.61–1.24)
Chloride: 102 mmol/L (ref 101–111)
GFR calc Af Amer: 45 mL/min — ABNORMAL LOW (ref >60)
GFR calc non Af Amer: 39 mL/min — ABNORMAL LOW (ref >60)
GLUCOSE: 99 mg/dL (ref 65–99)
Potassium: 4.3 mmol/L (ref 3.5–5.1)
Sodium: 133 mmol/L — ABNORMAL LOW (ref 135–145)
TOTAL PROTEIN: 6.4 g/dL — AB (ref 6.5–8.1)
Total Bilirubin: 10.1 mg/dL — ABNORMAL HIGH (ref 0.3–1.2)

## 2017-04-26 LAB — GLUCOSE, CAPILLARY
GLUCOSE-CAPILLARY: 93 mg/dL (ref 65–99)
GLUCOSE-CAPILLARY: 84 mg/dL (ref 65–99)
GLUCOSE-CAPILLARY: 82 mg/dL (ref 65–99)
GLUCOSE-CAPILLARY: 78 mg/dL (ref 65–99)
Glucose-Capillary: 104 mg/dL — ABNORMAL HIGH (ref 65–99)
Glucose-Capillary: 121 mg/dL — ABNORMAL HIGH (ref 65–99)
Glucose-Capillary: 82 mg/dL (ref 65–99)

## 2017-04-26 LAB — URINE CULTURE
CULTURE: NO GROWTH
SPECIAL REQUESTS: NORMAL

## 2017-04-26 LAB — RENAL FUNCTION PANEL
Albumin: 1.9 g/dL — ABNORMAL LOW (ref 3.5–5.0)
Anion gap: 7 (ref 5–15)
BUN: 15 mg/dL (ref 6–20)
CHLORIDE: 103 mmol/L (ref 101–111)
CO2: 24 mmol/L (ref 22–32)
Calcium: 7.9 mg/dL — ABNORMAL LOW (ref 8.9–10.3)
Creatinine, Ser: 1.74 mg/dL — ABNORMAL HIGH (ref 0.61–1.24)
GFR calc Af Amer: 50 mL/min — ABNORMAL LOW (ref >60)
GFR calc non Af Amer: 43 mL/min — ABNORMAL LOW (ref >60)
GLUCOSE: 89 mg/dL (ref 65–99)
POTASSIUM: 4.3 mmol/L (ref 3.5–5.1)
Phosphorus: 2.4 mg/dL — ABNORMAL LOW (ref 2.5–4.6)
Sodium: 134 mmol/L — ABNORMAL LOW (ref 135–145)

## 2017-04-26 LAB — LACTATE DEHYDROGENASE: LDH: 612 U/L — AB (ref 98–192)

## 2017-04-26 LAB — PROCALCITONIN: Procalcitonin: 12.91 ng/mL

## 2017-04-26 LAB — COOXEMETRY PANEL
Carboxyhemoglobin: 1.5 % (ref 0.5–1.5)
Methemoglobin: 1.3 % (ref 0.0–1.5)
O2 SAT: 75.4 %
Total hemoglobin: 9 g/dL — ABNORMAL LOW (ref 12.0–16.0)

## 2017-04-26 LAB — MAGNESIUM: MAGNESIUM: 2.7 mg/dL — AB (ref 1.7–2.4)

## 2017-04-26 LAB — PROTIME-INR
INR: 2.32
PROTHROMBIN TIME: 25.3 s — AB (ref 11.4–15.2)

## 2017-04-26 LAB — PHOSPHORUS: Phosphorus: 3.1 mg/dL (ref 2.5–4.6)

## 2017-04-26 MED ORDER — CHLORHEXIDINE GLUCONATE 0.12 % MT SOLN
15.0000 mL | Freq: Two times a day (BID) | OROMUCOSAL | Status: DC
  Administered 2017-04-26 – 2017-05-26 (×50): 15 mL via OROMUCOSAL
  Filled 2017-04-26 (×44): qty 15

## 2017-04-26 MED ORDER — ALBUMIN HUMAN 5 % IV SOLN
12.5000 g | Freq: Once | INTRAVENOUS | Status: AC
  Administered 2017-04-26: 12.5 g via INTRAVENOUS
  Filled 2017-04-26: qty 250

## 2017-04-26 MED ORDER — ORAL CARE MOUTH RINSE
15.0000 mL | Freq: Two times a day (BID) | OROMUCOSAL | Status: DC
  Administered 2017-04-27 – 2017-05-24 (×28): 15 mL via OROMUCOSAL

## 2017-04-26 NOTE — Progress Notes (Signed)
[facility] Rounding Note Background: 52 yo WM PMH HTN, HLD, ICM with EF 20-25% previously home inotropes,  Rec'd Heartmate III 04/20/17.  AoV sewed shut 2/2 mod AI. Destination therapy with possible bridge to transplant (type O blood which will make wait list time long). Pre-op creatinine 1.3-1.4. Creatinine rose abruptly post VAD to 3.34 with oliguria. No evidence sig hemolysis or thrombosis. CRRT initiated 04/23/17 for pulmonary edema/RF.  CRRT (12/30) Cards and TCTS managing fluid removal rates R [place] temp cath (12/30) All 4K fluids No heparin Effluent dose 32 ml/kg/hour today No clotting issues but had filter change during the night for some stringy material in dialyzer   Assessment/Recommendations  1. Oliguric AKI on CKD Stage III: Likely hemodynamic hypoperfusion injury.  Can not exclude some element pigment nephropathy (UA large blood, only 6-30 RBC's). No VAD clotting issues so suspicion of thromboembolic issue/renal infarct low, and though LDH had been increasing, down some more. CRRT initiated 12/30.  Volume down 5 kg since start of CRRT. Remains oligoanuric 1. Cards is managing fluid removal 2. Continue same 4K fluids, no heparin 3. Agree with prn use of albumin to help with 3rd spp fluid mobilization.  4.  2. Shock - felt vasodilatory/septic rather than cardiogenic. Improving. Pressors down (now just dobutamine/ NE, epi and MAP stable 85-95 3. ICM s/p VAD for end stage systolic HF. 13/08 s/p Heartmate III.  Pump speed 5300 Flow 4.6 L/min.  4. Leukocytosis - WBC continues to improve. All cultures neg to date. On empiric cefepime (day 3) (preop rec'd vanco, cefuroxime, rifampin and fluconazole)  5. PAF AF Amio 6. VDRF Re-intubated 12/30 pulm edema/resp acidosis.  7. Shock liver:  Congestive hepatopathy +/- hypoperfusion injury.   Subjective/Interval History:  Reintubated 12/30 d/t worsening resp status Pressor requirements down CVP 8 1 low flow event during the night Rec'd  albumin Weight down total about 5 kg but still with pulmonary and dependent peripheral edema  Objective Vital signs in last 24 hours: Vitals:   04/26/17 0743 04/26/17 0759 04/26/17 0800 04/26/17 0900  BP: (!) 86/73  (!) 85/74 90/77  Pulse: 90  90   Resp: '19  20 13  '$ Temp:  98 F (36.7 C)    TempSrc:  Oral    SpO2: 100%  100%   Weight:      Height:       Weight change: -3.1 kg (-13.4 oz)  Intake/Output Summary (Last 24 hours) at 04/26/2017 0932 Last data filed at 04/26/2017 0900 Gross per 24 hour  Intake 2675 ml  Output 4200 ml  Net -1525 ml   Physical Exam:  Blood pressure 90/77, pulse 90, temperature 98 F (36.7 C), temperature source Oral, resp. rate 13, height '5\' 5"'$  (1.651 m), weight 71.1 kg (156 lb 12 oz), SpO2 100 %.  Ill app WM Intubated, sedated R IJ swan, R [place] temp HD cath, chest tube(s), foley, VAD, orotracheal and orogastric tubes MAP 91 CVP 11 Sclerae icteric Ant fairly clear Mechanical heart sounds, VAD hum LVAD driveline exit site dressing intact.  Abd tight, faint BS today 1+ dependent edema LE's (thighs and upper arms) Foley dark yellow brown urine    Recent Labs  Lab 04/23/17 0336 04/23/17 1558 04/24/17 0446 04/24/17 0816 04/24/17 1525 04/25/17 0359 04/25/17 1628 04/26/17 0343  NA 126* 129* 132* 131* 132* 133* 132* 133*  K 3.8 4.7 4.4 4.5 4.4 4.5 4.5 4.3  CL 95* 98* 99* 98* 100* 101 100* 102  CO2 19* '23 25 24 25 '$ 26  25 24  GLUCOSE 129* 107* 119* 109* 123* 123* 114* 99  BUN 36* 32* 23* 24* '20 19 14 15  '$ CREATININE 3.34* 3.13* 2.41* 2.44* 2.26* 1.99* 1.81* 1.88*  CALCIUM 7.8* 7.5* 7.4* 7.1* 7.2* 7.6* 7.8* 7.9*  PHOS 5.5* 6.5* 3.9  3.9  --  3.2 3.5 3.4 3.1    Recent Labs  Lab 04/24/17 0816  04/25/17 0359 04/25/17 1628 04/26/17 0343  AST 256*  --  223*  --  201*  ALT 212*  --  201*  --  187*  ALKPHOS 128*  --  152*  --  177*  BILITOT 10.4*  --  9.8*  --  10.1*  PROT 6.0*  --  6.0*  --  6.4*  ALBUMIN 1.9*   < > 1.8* 1.8* 1.8*   < > =  values in this interval not displayed.    Recent Labs  Lab 04/23/17 0336 04/23/17 2241 04/24/17 0446 04/25/17 0359 04/26/17 0343  WBC 34.7*  --  19.9* 14.3* 13.5*  NEUTROABS 31.9*  --  17.5* 11.8* 11.2*  HGB 8.8* 8.3* 9.2* 8.1* 8.9*  HCT 26.4* 25.4* 28.1* 25.1* 26.9*  MCV 77.0*  --  77.4* 77.5* 78.4  PLT 143*  --  127* 118* 114*    Recent Labs  Lab 04/25/17 1625 04/25/17 2006 04/26/17 0001 04/26/17 0349 04/26/17 0758  GLUCAP 112* 110* 121* 93 84   Results for KYLIN, DUBS (MRN [id number]) as of 04/26/2017 09:21  03/13/2017 11:30 04/21/2017 02:59 04/22/2017 03:56 04/23/2017 03:36 04/24/2017 04:46 04/25/2017 03:59 04/26/2017 03:43  LDH 205 (H) 407 (H) 576 (H) 923 (H) 967 (H) 704 (H) 612 (H)     Studies/Results: Dg Chest Port 1 View  Result Date: 04/26/2017 CLINICAL DATA:  Check endotracheal tube placement EXAM: PORTABLE CHEST 1 VIEW COMPARISON:  04/25/2017 FINDINGS: Cardiac shadow is enlarged. LVAD is again identified. Defibrillator is again seen and stable. Endotracheal tube, nasogastric catheter, right jugular sheath and right subclavian central line are again seen and stable. The Swan-Ganz catheter has been removed. The lungs are well aerated bilaterally. Mild interstitial edema is again seen and stable. No new focal abnormality is noted. IMPRESSION: No significant change from the prior exam aside from removal of the Swan-Ganz catheter Electronically Signed   By: Inez Catalina M.D.   On: 04/26/2017 07:28   Dg Chest Port 1 View  Result Date: 04/25/2017 CLINICAL DATA:  Left ventricular assist device EXAM: PORTABLE CHEST 1 VIEW COMPARISON:  04/24/2017 FINDINGS: Endotracheal tube in good position. Swan-Ganz catheter remains kinked in the main pulmonary artery. Right subclavian central venous catheter tip in the lower SVC unchanged. NG in the stomach. Left chest tube remains in place.  No pneumothorax. Left ventricular assist device unchanged.  AICD unchanged. Negative for pneumothorax.  Bilateral airspace disease consistent with edema shows mild improvement. Left lower lobe atelectasis unchanged IMPRESSION: Support lines remain in good position.  No pneumothorax Mild improvement pulmonary edema. Electronically Signed   By: Franchot Gallo M.D.   On: 04/25/2017 08:04   Medications: . sodium chloride Stopped (04/24/17 1701)  . sodium chloride Stopped (04/24/17 1701)  . ceFEPime (MAXIPIME) IV Stopped (04/26/17 0000)  . dexmedetomidine (PRECEDEX) IV infusion 0.5 mcg/kg/hr (04/26/17 0800)  . DOBUTamine 5 mcg/kg/min (04/26/17 0800)  . EPINEPHrine 4 mg in dextrose 5% 250 mL infusion (16 mcg/mL) 5.013 mcg/min (04/26/17 0800)  . lactated ringers 10 mL/hr at 04/26/17 0800  . norepinephrine (LEVOPHED) Adult infusion 5.013 mcg/min (04/26/17 0800)  . dialysis replacement fluid (  prismasate) 400 mL/hr at 04/26/17 0300  . dialysis replacement fluid (prismasate) 200 mL/hr at 04/25/17 1408  . dialysate (PRISMASATE) 1,700 mL/hr at 04/26/17 0645  . sodium chloride     . aspirin  325 mg Per Tube Daily  . bisacodyl  10 mg Rectal Daily   Or  . bisacodyl  10 mg Oral Daily  . chlorhexidine gluconate (MEDLINE KIT)  15 mL Mouth Rinse BID  . Chlorhexidine Gluconate Cloth  6 each Topical Daily  . insulin aspart  0-24 Units Subcutaneous Q4H  . levalbuterol  1.25 mg Nebulization Q6H  . mouth rinse  15 mL Mouth Rinse 10 times per day  . pantoprazole (PROTONIX) IV  40 mg Intravenous Q24H  . sodium chloride flush  10-40 mL Intracatheter Q12H  . sodium chloride flush  3 mL Intravenous Q12H  . Warfarin - Physician Dosing Inpatient   Does not apply [place], MD [facility] [phone] pager 04/26/2017, 9:32 AM

## 2017-04-26 NOTE — Progress Notes (Signed)
Dr. Cyndia Bent updated on suction event at [place] and pt condition, orders received

## 2017-04-26 NOTE — Progress Notes (Signed)
LVAD Coordinator Rounding Note:  Admitted12/26due to decompensated heart failure.  Heartmate IIILVAD [facility] DT[facility]. AV valve sewed shut due to aortic insufficiency.    Vital signs: Tmax: 98 HR:90 Doppler Pressure: 78 Art line BP:85/74 (85) O2 Sat:100% on 40% FiO2 Wt:165>164>156 lbs   LVAD interrogation reveals:   Speed:5300 Flow:4.8 Power:3.9 PI:3.5 Alarms:1 low flow alarm at 0425 Events:>48 PI events within 24 hours Hematocrit:27 Fixed speed:5300 Low speed limit:5000   Drive Line:Dressing CDI with 2 foley anchors present this AM. Date on the dressing is 04/25/17. Discussed with RN who plans to change today. Will assist if needed.  Labs:  LDH trend:205>407>576>923>967>612  INR trend:1.34>1.52>1.73>1.95>2.32  Anticoagulation Plan: -INR Goal:2-2.5 -ASA Dose:325 mg  Blood Products: Intra op:2 units/FFP 04/20/17  PRBC's:  1 unit 12/29 1 unit 12/30 1 unit 04/25/17  Device:[employer]tronic BiV -Therapies:off   Respiratory: NO at 5 LPM Extubated 12/28 Reintubated 04/23/2017   Renal:  -BUN/CRT:13/1.6, 36/3.34, 32/3.13, 23/2.41, 24/2.44, 15/1.88 -Renal consulted 04/23/17> CVVHD started  Gtts: Levophed 47mcg/min Epi17mcg/min Precedex 0.59mcg/kg/hr Milrinone off 04/24/2017 Dobutamine 5 mcg/kg/min Vasopressin off 04/26/17   Plan/Recommendations:   1. Continue supportive care. Possible extubation today. 2. VAD dressing to be changed daily with silver strip per protocol 3. Please page VAD coordinator for any equipment concerns.   [name] RN, VAD Coordinator 24/7 pager [phone]

## 2017-04-26 NOTE — Progress Notes (Signed)
Patient ID: David [name], male   DOB: [birth year], 52 y.o.   MRN: [id number]   Advanced Heart Failure VAD Team Note  Subjective:    David [name] is a 52 y.o. male with a PMH of ICM ('09 PCI to LAD, mRCA, '10 PCI to Lcx and '12 mRCA), HFrEF (EF ~20% for >5 years), HLD and HTN admitted 12/26 for VAD placement due to end-stage systolic HF.   Underwent successful HM-3 LVAD placement 04/20/17.  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   Extubated 12/28.  Epinephrine stopped but then UOP and co-ox dropped so restarted.  MAP lower when sitting in chair yesterday so norepinephrine restarted 12/29.  Poor UOP despite Lasix gtt and creatinine continues to rise, up to 3.34.    Nephrology consulted 04/23/17. Femoral line placed and CVVH begun.   Intubated 04/23/17 with worsening acidosis and resp distress on Bipap. Developed progressive shock with uptitration of norepinephrine and epinephrine and addition of vasopressin.  Milrinone stopped and dobutamine begun.  CXR with CHF.    Remains intubated and sedated. Had low flow this am and given fluid back via CVVHD.   Coox 75% on Levophed 5, Dobutamine 5, and Epi 5. Off vasopressin.   LDH 407 -> 576 -> 923 -> 967 -> 704 -> 612 INR 1.34 -> 1.52 -> 1.73 -> 1.95 -> 2.06 -> 2.32  Afebrile. WBC down to 13.5. CXR improving.   LFTs elevated with tbili 9.6 => 10.1  Echo 12/31 reviewed: No significant pericardial effusion, IV septum midline to slightly to the right, RV function adequate.   LVAD INTERROGATION:  HeartMate III LVAD:  Flow 4.5 liters/min, speed 5300, power 4.0, PI 4.6. Frequent PI events overnight. 1 low flow event this am.   Objective:    Vital Signs:   Temp:  [96.1 F (35.6 C)-98.6 F (37 C)] 96.9 F (36.1 C) (01/02 0402) Pulse Rate:  [35-94] 78 (01/02 0700) Resp:  [15-26] 22 (01/02 0700) BP: (80-124)/(65-105) 94/79 (01/02 0700) SpO2:  [95 %-100 %] 100 % (01/02 0700) Arterial Line BP: (67-124)/(47-105) 94/79 (01/02  0700) FiO2 (%):  [40 %-60 %] 40 % (01/02 0438) Weight:  [156 lb 12 oz (71.1 kg)] 156 lb 12 oz (71.1 kg) (01/02 0600) Last BM Date: 04/18/17 Mean arterial Pressure 80s  Intake/Output:   Intake/Output Summary (Last 24 hours) at 04/26/2017 0714 Last data filed at 04/26/2017 0700 Gross per 24 hour  Intake 2891.6 ml  Output 4451 ml  Net -1559.4 ml     Physical Exam    GENERAL: Intubated and sedated. HEENT: Normal. NECK: Supple, JVP 7-8 cm. Carotids OK.  CARDIAC:  Mechanical heart sounds with LVAD hum present.  LUNGS:  Mechanical breathing sounds.   ABDOMEN:  NT, ND, no HSM. No bruits or masses. +BS  LVAD exit site: Dressing dry and intact. No erythema or drainage. Stabilization device present and accurately applied. Driveline dressing changed daily per sterile technique. EXTREMITIES:  Warm and dry. No cyanosis, clubbing, or rash. Trace ankle edema.  NEUROLOGIC:  Intubated and sedated.   Telemetry   Afib, Rates 80-90s, personally reviewed.   Labs   Basic Metabolic Panel: Recent Labs  Lab 04/22/17 0356  04/23/17 0336  04/24/17 0446 04/24/17 0816 04/24/17 1525 04/25/17 0359 04/25/17 1628 04/26/17 0343  NA 129*   < > 126*   < > 132* 131* 132* 133* 132* 133*  K 4.6   < > 3.8   < > 4.4 4.5 4.4 4.5 4.5 4.3  CL 99*  --  95*   < > 99* 98* 100* 101 100* 102  CO2 19*  --  19*   < > _0 GLUCOSE 109*   < > 129*   < > 119* 109* 123* 123* 114* 99  BUN 21*  --  36*   < > 23* 24* _1 CREATININE 2.35*  --  3.34*   < > 2.41* 2.44* 2.26* 1.99* 1.81* 1.88*  CALCIUM 8.3*  --  7.8*   < > 7.4* 7.1* 7.2* 7.6* 7.8* 7.9*  MG 1.8  --  2.5*  --  2.3  --   --  2.5*  --  2.7*  PHOS 4.4  --  5.5*   < > 3.9  3.9  --  3.2 3.5 3.4 3.1   < > = values in this interval not displayed.    Liver Function Tests: Recent Labs  Lab 04/22/17 0356 04/23/17 0336  04/24/17 0816 04/24/17 1525 04/25/17 0359 04/25/17 1628 04/26/17 0343  AST 171* 319*  --  256*  --  223*  --  201*   ALT 77* 208*  --  212*  --  201*  --  187*  ALKPHOS 75 95  --  128*  --  152*  --  177*  BILITOT 7.4* 9.6*  --  10.4*  --  9.8*  --  10.1*  PROT 5.8* 5.7*  --  6.0*  --  6.0*  --  6.4*  ALBUMIN 2.5* 2.2*   < > 1.9* 1.8* 1.8* 1.8* 1.8*   < > = values in this interval not displayed.   No results for input(s): LIPASE, AMYLASE in the last 168 hours. No results for input(s): AMMONIA in the last 168 hours.  CBC: Recent Labs  Lab 04/22/17 0356  04/23/17 0336 04/23/17 2241 04/24/17 0446 04/25/17 0359 04/26/17 0343  WBC 36.9*  --  34.7*  --  19.9* 14.3* 13.5*  NEUTROABS 32.8*  --  31.9*  --  17.5* 11.8* 11.2*  HGB 8.4*   < > 8.8* 8.3* 9.2* 8.1* 8.9*  HCT 25.9*   < > 26.4* 25.4* 28.1* 25.1* 26.9*  MCV 76.9*  --  77.0*  --  77.4* 77.5* 78.4  PLT 156  --  143*  --  127* 118* 114*   < > = values in this interval not displayed.    INR: Recent Labs  Lab 04/22/17 0356 04/23/17 0336 04/24/17 0446 04/25/17 0359 04/26/17 0343  INR 1.52 1.73 1.95 2.06 2.32    Other results:  Imaging   Dg Chest Port 1 View  Result Date: 04/25/2017 CLINICAL DATA:  Left ventricular assist device EXAM: PORTABLE CHEST 1 VIEW COMPARISON:  04/24/2017 FINDINGS: Endotracheal tube in good position. Swan-Ganz catheter remains kinked in the main pulmonary artery. Right subclavian central venous catheter tip in the lower SVC unchanged. NG in the stomach. Left chest tube remains in place.  No pneumothorax. Left ventricular assist device unchanged.  AICD unchanged. Negative for pneumothorax. Bilateral airspace disease consistent with edema shows mild improvement. Left lower lobe atelectasis unchanged IMPRESSION: Support lines remain in good position.  No pneumothorax Mild improvement pulmonary edema. Electronically Signed   By: Franchot Gallo M.D.   On: 04/25/2017 08:04     Medications:     Scheduled Medications: . aspirin  325 mg Per Tube Daily  . bisacodyl  10 mg Rectal Daily   Or  . bisacodyl  10 mg Oral  Daily  .  chlorhexidine gluconate (MEDLINE KIT)  15 mL Mouth Rinse BID  . Chlorhexidine Gluconate Cloth  6 each Topical Daily  . insulin aspart  0-24 Units Subcutaneous Q4H  . levalbuterol  1.25 mg Nebulization Q6H  . mouth rinse  15 mL Mouth Rinse 10 times per day  . pantoprazole (PROTONIX) IV  40 mg Intravenous Q24H  . sodium chloride flush  10-40 mL Intracatheter Q12H  . sodium chloride flush  3 mL Intravenous Q12H  . Warfarin - Physician Dosing Inpatient   Does not apply q1800    Infusions: . sodium chloride Stopped (04/24/17 1701)  . sodium chloride Stopped (04/24/17 1701)  . ceFEPime (MAXIPIME) IV Stopped (04/26/17 0000)  . dexmedetomidine (PRECEDEX) IV infusion 0.5 mcg/kg/hr (04/26/17 0700)  . DOBUTamine 5 mcg/kg/min (04/26/17 0700)  . EPINEPHrine 4 mg in dextrose 5% 250 mL infusion (16 mcg/mL) 5 mcg/min (04/26/17 0700)  . lactated ringers 10 mL/hr at 04/26/17 0700  . norepinephrine (LEVOPHED) Adult infusion 5 mcg/min (04/26/17 0700)  . dialysis replacement fluid (prismasate) 400 mL/hr at 04/26/17 0300  . dialysis replacement fluid (prismasate) 200 mL/hr at 04/25/17 1408  . dialysate (PRISMASATE) 1,700 mL/hr at 04/26/17 0645  . sodium chloride    . vasopressin (PITRESSIN) infusion - *FOR SHOCK* Stopped (04/26/17 0700)    PRN Medications: sodium chloride, heparin, morphine injection, ondansetron (ZOFRAN) IV, oxyCODONE, sodium chloride, sodium chloride flush, sodium chloride flush, traMADol   Patient Profile   David [name] is a 52 y.o. male with a PMH of ICM ('09 PCI to LAD, mRCA, '10 PCI to Lcx and '12 mRCA), HFrEF (EF ~20% for >5 years) s/p HM-III VAD placement on 12/27 due to end-stage systolic HF.   Assessment/Plan:    1. Acute on chronic systolic HF: Echo 56/31/49 with LVEF 20-25%, Mild MR, Mild/Mod MR, Severe LAE, Mildly reduced RV, PA peak pressure 56 mm Hg.  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 Extubated, initially stopped  epinephrine/norepinephrine but restarted with fall in UOP and MAP.  With progressive renal failure, pulmonary edema, and hypotension, he was re-intubated on 12/30.   CVVH was begun.  12/31 was difficult day with refractory shock (MAP in 60s).  Reviewed 12/31 echo which showed no significant pericardial effusion, adequate RV function, and IV septum near mid-line. Given suspected for vasodilatory/septic shock with PCT 31 04/24/17, he is covered with cefepime.   - CO 75.4% this am. CVP 8.  - CVVH even this am with low flow and frequent PI events yesterday.  - Continue current pressor/inotrope support for now.  - He is on ASA 325 currently, has not been getting warfarin (INR rose with elevated LFTs).  - Warfarin goal INR 2-2.5, 2.32 today.  Luiz Blare pulled 04/25/17.  2. CAD s/p PCI to mid RCA and PLOM with DES x 2 and DES to mid LAD in 1/18.   - Continue ASA and statin. No change. 3. PAF: Suspect atrial fibrillation but rate is controlled. He is on po amiodarone but do not think this caused rise in LFTs.  No change.  4. ARF on CKD stage III: Suspect some degree of intrinsic renal disease prior to LVAD placement (baseline creatinine around 1.5) with probable ATN from intra-op/peri-op hypotension and development of vasodilatory/septic shock.   - Remains on CVVHD. Ran negative ~ 100 most of the night.  5. ABLA, expected: Got 1 unit PRBCs on 12/30.  - Hgb 8.9 s/p 1 u PRBCs yuesterday.  6. ID: Started on cefepime empirically  with septic/vasodilatory shock, PCT was 31 yesterday.  Cultures (blood, sputum) NGTD.   - WBCs trending down to 13. MAP improved.  7. Elevated LFTs: - Tbili to 10.1 - Suspect shock liver, should improve with better MAP and fluid off.  Length of Stay: [address]  Annamaria Helling 04/26/2017, 7:14 AM  VAD Team --- VAD ISSUES ONLY--- Pager [phone] (7am - 7am)  Advanced Heart Failure Team  Pager [phone] (M-F; 7a - 4p)  Please contact [facility] for night-coverage after  hours (4p -7a ) and weekends on [email or web address]  Patient seen with PA, agree with the above note.  I adjusted the above note to reflect my thoughts.  Patient slowly improving, titrated down on pressors yesterday with vasopressin now off.  Net negative -8550 with CVVH and weight down.  He had 1 low flow/suction event this morning. CVP 8 today. Co-ox remains good at 75%.   On exam, he is intubated but will awaken and follow commands.  JVP not markedly elevated.  Normal LVAD sounds.  No edema.   He is making improvement.  Suspect distributive shock with high procalcitonin 31 => 12.9.  WBCs also falling.  He remains on cefepime.  Cultures so far negative.   - Continue slow titration of vasopressors keeping MAP around 80.  - Run CVVH even for now with suction event and CVP 8.  - CXR improving, will wean vent today.  - Continue cefepime.   CRITICAL CARE Performed by: Loralie Champagne  Total critical care time: 35 minutes  Critical care time was exclusive of separately billable procedures and treating other patients.  Critical care was necessary to treat or prevent imminent or life-threatening deterioration.  Critical care was time spent personally by me on the following activities: development of treatment plan with patient and/or surrogate as well as nursing, discussions with consultants, evaluation of patient's response to treatment, examination of patient, obtaining history from patient or surrogate, ordering and performing treatments and interventions, ordering and review of laboratory studies, ordering and review of radiographic studies, pulse oximetry and re-evaluation of patient's condition.  Loralie Champagne 04/26/2017 7:50 AM

## 2017-04-26 NOTE — Progress Notes (Signed)
Patient ID: David [name], male   DOB: Apr 22, 1966, 52 y.o.   MRN: [id number] HeartMate 3 Rounding Note  Subjective:    Remains on vent with Precedex 0.5. FiO2 is on 40% with good ABG  Co-ox is 75.4 Hemodynamics have been stable overnight and weaned off vasopressin, levophed down to 5 mcg, epi down to 5 mcg. MAP is still 80's Had one suction event early this am with drop in flow and BP, resolved quickly. Gave some albumin and stopped removing volume. CVP 8 this am.   LVAD INTERROGATION:  HeartMate IIl LVAD:  Flow 4.7 liters/min, speed 5300, power 4, PI 3.6.  No PI events other than single suction event  Objective:    Vital Signs:   Temp:  [96.1 F (35.6 C)-98.6 F (37 C)] 96.9 F (36.1 C) (01/02 0402) Pulse Rate:  [35-94] 90 (01/02 0743) Resp:  [15-26] 19 (01/02 0743) BP: (80-124)/(65-105) 86/73 (01/02 0743) SpO2:  [95 %-100 %] 100 % (01/02 0743) Arterial Line BP: (67-124)/(47-105) 86/75 (01/02 0715) FiO2 (%):  [40 %-60 %] 40 % (01/02 0743) Weight:  [71.1 kg (156 lb 12 oz)] 71.1 kg (156 lb 12 oz) (01/02 0600) Last BM Date: 04/18/17 Mean arterial Pressure 80-90  Intake/Output:   Intake/Output Summary (Last 24 hours) at 04/26/2017 0755 Last data filed at 04/26/2017 0700 Gross per 24 hour  Intake 2891.6 ml  Output 4451 ml  Net -1559.4 ml     Physical Exam: General:  Intubated and sedated, looks comfortable HEENT: ETT Cor: Normal heart sounds with LVAD hum present. Lungs: clear Abdomen: soft, nontender, nondistended. Few bowel sounds. Extremities: decreased edema, warm Neuro: nods and follows commands  Telemetry: atrial fib 90's  Labs: Basic Metabolic Panel: Recent Labs  Lab 04/22/17 0356  04/23/17 0336  04/24/17 0446 04/24/17 0816 04/24/17 1525 04/25/17 0359 04/25/17 1628 04/26/17 0343  NA 129*   < > 126*   < > 132* 131* 132* 133* 132* 133*  K 4.6   < > 3.8   < > 4.4 4.5 4.4 4.5 4.5 4.3  CL 99*  --  95*   < > 99* 98* 100* 101 100* 102  CO2 19*  --  19*   <  > '25 24 25 26 25 24  '$ GLUCOSE 109*   < > 129*   < > 119* 109* 123* 123* 114* 99  BUN 21*  --  36*   < > 23* 24* '20 19 14 15  '$ CREATININE 2.35*  --  3.34*   < > 2.41* 2.44* 2.26* 1.99* 1.81* 1.88*  CALCIUM 8.3*  --  7.8*   < > 7.4* 7.1* 7.2* 7.6* 7.8* 7.9*  MG 1.8  --  2.5*  --  2.3  --   --  2.5*  --  2.7*  PHOS 4.4  --  5.5*   < > 3.9  3.9  --  3.2 3.5 3.4 3.1   < > = values in this interval not displayed.    Liver Function Tests: Recent Labs  Lab 04/22/17 0356 04/23/17 0336  04/24/17 0816 04/24/17 1525 04/25/17 0359 04/25/17 1628 04/26/17 0343  AST 171* 319*  --  256*  --  223*  --  201*  ALT 77* 208*  --  212*  --  201*  --  187*  ALKPHOS 75 95  --  128*  --  152*  --  177*  BILITOT 7.4* 9.6*  --  10.4*  --  9.8*  --  10.1*  PROT 5.8* 5.7*  --  6.0*  --  6.0*  --  6.4*  ALBUMIN 2.5* 2.2*   < > 1.9* 1.8* 1.8* 1.8* 1.8*   < > = values in this interval not displayed.   No results for input(s): LIPASE, AMYLASE in the last 168 hours. No results for input(s): AMMONIA in the last 168 hours.  CBC: Recent Labs  Lab 04/22/17 0356  04/23/17 0336 04/23/17 2241 04/24/17 0446 04/25/17 0359 04/26/17 0343  WBC 36.9*  --  34.7*  --  19.9* 14.3* 13.5*  NEUTROABS 32.8*  --  31.9*  --  17.5* 11.8* 11.2*  HGB 8.4*   < > 8.8* 8.3* 9.2* 8.1* 8.9*  HCT 25.9*   < > 26.4* 25.4* 28.1* 25.1* 26.9*  MCV 76.9*  --  77.0*  --  77.4* 77.5* 78.4  PLT 156  --  143*  --  127* 118* 114*   < > = values in this interval not displayed.    INR: Recent Labs  Lab 04/22/17 0356 04/23/17 0336 04/24/17 0446 04/25/17 0359 04/26/17 0343  INR 1.52 1.73 1.95 2.06 2.32    Other results:  EKG:   Imaging: Dg Chest Port 1 View  Result Date: 04/26/2017 CLINICAL DATA:  Check endotracheal tube placement EXAM: PORTABLE CHEST 1 VIEW COMPARISON:  04/25/2017 FINDINGS: Cardiac shadow is enlarged. LVAD is again identified. Defibrillator is again seen and stable. Endotracheal tube, nasogastric catheter, right  jugular sheath and right subclavian central line are again seen and stable. The Swan-Ganz catheter has been removed. The lungs are well aerated bilaterally. Mild interstitial edema is again seen and stable. No new focal abnormality is noted. IMPRESSION: No significant change from the prior exam aside from removal of the Swan-Ganz catheter Electronically Signed   By: Inez Catalina M.D.   On: 04/26/2017 07:28   Dg Chest Port 1 View  Result Date: 04/25/2017 CLINICAL DATA:  Left ventricular assist device EXAM: PORTABLE CHEST 1 VIEW COMPARISON:  04/24/2017 FINDINGS: Endotracheal tube in good position. Swan-Ganz catheter remains kinked in the main pulmonary artery. Right subclavian central venous catheter tip in the lower SVC unchanged. NG in the stomach. Left chest tube remains in place.  No pneumothorax. Left ventricular assist device unchanged.  AICD unchanged. Negative for pneumothorax. Bilateral airspace disease consistent with edema shows mild improvement. Left lower lobe atelectasis unchanged IMPRESSION: Support lines remain in good position.  No pneumothorax Mild improvement pulmonary edema. Electronically Signed   By: Franchot Gallo M.D.   On: 04/25/2017 08:04      Medications:     Scheduled Medications: . aspirin  325 mg Per Tube Daily  . bisacodyl  10 mg Rectal Daily   Or  . bisacodyl  10 mg Oral Daily  . chlorhexidine gluconate (MEDLINE KIT)  15 mL Mouth Rinse BID  . Chlorhexidine Gluconate Cloth  6 each Topical Daily  . insulin aspart  0-24 Units Subcutaneous Q4H  . levalbuterol  1.25 mg Nebulization Q6H  . mouth rinse  15 mL Mouth Rinse 10 times per day  . pantoprazole (PROTONIX) IV  40 mg Intravenous Q24H  . sodium chloride flush  10-40 mL Intracatheter Q12H  . sodium chloride flush  3 mL Intravenous Q12H  . Warfarin - Physician Dosing Inpatient   Does not apply q1800     Infusions: . sodium chloride Stopped (04/24/17 1701)  . sodium chloride Stopped (04/24/17 1701)  . ceFEPime  (MAXIPIME) IV Stopped (04/26/17 0000)  . dexmedetomidine (PRECEDEX) IV infusion  0.5 mcg/kg/hr (04/26/17 0700)  . DOBUTamine 5 mcg/kg/min (04/26/17 0700)  . EPINEPHrine 4 mg in dextrose 5% 250 mL infusion (16 mcg/mL) 5 mcg/min (04/26/17 0700)  . lactated ringers 10 mL/hr at 04/26/17 0700  . norepinephrine (LEVOPHED) Adult infusion 5 mcg/min (04/26/17 0700)  . dialysis replacement fluid (prismasate) 400 mL/hr at 04/26/17 0300  . dialysis replacement fluid (prismasate) 200 mL/hr at 04/25/17 1408  . dialysate (PRISMASATE) 1,700 mL/hr at 04/26/17 0645  . sodium chloride       PRN Medications:  sodium chloride, heparin, morphine injection, ondansetron (ZOFRAN) IV, oxyCODONE, sodium chloride, sodium chloride flush, sodium chloride flush, traMADol   Assessment:   1. Ischemic cardiomyopathy s/p multiple PCI's with chronic systolic heart failure and EF 20% on home dobutamine preop with significant improvement in symptoms. Evaluated at [facility] for heart transplant but LVAD felt to be the best treatment for him at this time. POD6s/p HeartMate III LVAD. He is critically ill with multi-system organ failure. Hemodynamics have improved markedly over the past two days correlating with improvement in inflammatory markers. WBC down to 13.5, PCT down to 12.91 2.H/OHypertension 3. H/OHyperlipidemia 4. PAF in sinuspreopon amiodarone and Eliquis.Now back in atrial fib. Amio stopped.  5. H/O Stage III CKD. Acute on chronic postop renal failurewith anuria now on CRRT.  6. LBBB s/p CRT-D upgrade in 10/2016 without improvement in symptoms or EF. LV epicardial leads cut off at the time of surgery. 7. Prior smoking with mild obstruction and severe diffusion defect on PFT's. CXRshowsedema or ALI but slightly improved. Oxygenation is improving and now down on 50%. 8. Postop leukocytosis and fever. I suspect this is inflammatory occurring early postopbut with his complicated coursehe wasstartedantibiotics.  CXR much improved. All cultures negative so far.  9. Marked rise in bilirubin and transaminases. Likely due to pump runand right sided congestion, hypotension.stable  10. INR is 2.32 today after one small dose of Coumadin a couple days ago. Hold off on anticoagulation for now. LDH is down to 612 but this is due to liver dysfunction at this point.   I discussed status with wife at bedside.     Plan/Discussion:    Overall things look much better. Will try to wean vent and extubate today. Continue to wean vasopressors as tolerated. Maintain MAP in the 80's if possible. Continue CVVHD but keep even for now since CVP 8 and had a suction event this am. Continue Maxipime for now.  INR therapeutic but will hold off on further Coumadin for now due to liver dysfunction and coagulopathy.  I reviewed the LVAD parameters from today, and compared the results to the patient's prior recorded data.  No programming changes were made.  The LVAD is functioning within specified parameters.   LVAD interrogation was negative for any significant power changes, alarms or PI events/speed drops.  LVAD equipment check completed and is in good working order.  Back-up equipment present.   LVAD education done on emergency procedures and precautions and reviewed exit site care.  Length of Stay: 7  Gaye Pollack 04/26/2017, 7:55 AM

## 2017-04-26 NOTE — Progress Notes (Signed)
Patient ID: David [name], male   DOB: 09-Apr-1966, 52 y.o.   MRN: [id number] TCTS Evening Rounds:  He has been hemodynamically stable today. Levophed down to 2. Epi at 4, dobut at 4.  Extubated and doing well. Awake and alert.  CVP 11. Maintaining even with CVVHD.  VAD parameters stable.  Will get swallowing eval tomorrow before starting po.

## 2017-04-26 NOTE — Progress Notes (Signed)
CSW met with wife in the waiting room. Wife hopeful for continued improvement. Patient extubated this afternoon and awake and talking with family at bedside. Wife appears to be coping well under stressful conditions. CSW provided encouragement and will continue to follow throughout implant hospitalization. Raquel Sarna, [place], [place]

## 2017-04-26 NOTE — Procedures (Signed)
Extubation Procedure Note  Patient Details:   Name: David [name] DOB: 08-11-1965 MRN: [id number]   Airway Documentation:     Evaluation  O2 sats: stable throughout Complications: No apparent complications Patient did tolerate procedure well. Bilateral Breath Sounds: Clear, Diminished   Yes   Positive cuff leak, NIF - 23, VC 940 L.  Pt extubated and placed on [place] 4 L with Nitric at 5 ppm, pt tolerating well at this time.  No stridor noted.  Pt able to reach 750 using incentive spirometer.  Bayard Beaver 04/26/2017, 12:48 PM

## 2017-04-26 NOTE — Progress Notes (Signed)
OT Cancellation Note  Patient Details Name: David [name] MRN: [id number] DOB: 1965-08-10   Cancelled Treatment:    Reason Eval/Treat Not Completed: Medical issues which prohibited therapy(remains intubated and sedated). Will check back as appropriate.  Norman Herrlich, MS OTR/L  Pager: [phone]   Norman Herrlich 04/26/2017, 7:33 AM

## 2017-04-26 NOTE — Progress Notes (Signed)
PT Cancellation Note  Patient Details Name: David [name] MRN: [id number] DOB: 1965-11-27   Cancelled Treatment:    Reason Eval/Treat Not Completed: (P) Medical issues which prohibited therapy(Pt remains intubated, possible extubation today, will defer treatment today and return when patient is medically appropriate.  )   Shardee Dieu Eli Hose 04/26/2017, 11:08 AM  [name], PTA pager [phone]

## 2017-04-26 NOTE — Progress Notes (Signed)
Charted reviewed for LOS; B Jama Krichbaum RN,MA,BSN [phone] 

## 2017-04-26 NOTE — Progress Notes (Signed)
Suction event with coughing

## 2017-04-27 ENCOUNTER — Inpatient Hospital Stay (HOSPITAL_COMMUNITY): Payer: Medicare HMO

## 2017-04-27 DIAGNOSIS — Z95811 Presence of heart assist device: Secondary | ICD-10-CM

## 2017-04-27 DIAGNOSIS — I5023 Acute on chronic systolic (congestive) heart failure: Secondary | ICD-10-CM

## 2017-04-27 LAB — COOXEMETRY PANEL
CARBOXYHEMOGLOBIN: 1.5 % (ref 0.5–1.5)
CARBOXYHEMOGLOBIN: 2 % — AB (ref 0.5–1.5)
Carboxyhemoglobin: 2 % — ABNORMAL HIGH (ref 0.5–1.5)
METHEMOGLOBIN: 1 % (ref 0.0–1.5)
METHEMOGLOBIN: 0.8 % (ref 0.0–1.5)
METHEMOGLOBIN: 0.8 % (ref 0.0–1.5)
O2 SAT: 51.1 %
O2 SAT: 43.4 %
O2 Saturation: 48.5 %
TOTAL HEMOGLOBIN: 10.7 g/dL — AB (ref 12.0–16.0)
Total hemoglobin: 15.4 g/dL (ref 12.0–16.0)
Total hemoglobin: 12.1 g/dL (ref 12.0–16.0)

## 2017-04-27 LAB — CULTURE, BLOOD (ROUTINE X 2)
CULTURE: NO GROWTH
CULTURE: NO GROWTH
SPECIAL REQUESTS: ADEQUATE
Special Requests: ADEQUATE

## 2017-04-27 LAB — COMPREHENSIVE METABOLIC PANEL
ALT: 144 U/L — ABNORMAL HIGH (ref 17–63)
AST: 149 U/L — AB (ref 15–41)
Albumin: 1.9 g/dL — ABNORMAL LOW (ref 3.5–5.0)
Alkaline Phosphatase: 191 U/L — ABNORMAL HIGH (ref 38–126)
Anion gap: 10 (ref 5–15)
BILIRUBIN TOTAL: 10.7 mg/dL — AB (ref 0.3–1.2)
BUN: 16 mg/dL (ref 6–20)
CHLORIDE: 102 mmol/L (ref 101–111)
CO2: 24 mmol/L (ref 22–32)
Calcium: 8.1 mg/dL — ABNORMAL LOW (ref 8.9–10.3)
Creatinine, Ser: 1.64 mg/dL — ABNORMAL HIGH (ref 0.61–1.24)
GFR calc Af Amer: 54 mL/min — ABNORMAL LOW (ref >60)
GFR, EST NON AFRICAN AMERICAN: 46 mL/min — AB (ref >60)
Glucose, Bld: 80 mg/dL (ref 65–99)
POTASSIUM: 4 mmol/L (ref 3.5–5.1)
Sodium: 136 mmol/L (ref 135–145)
TOTAL PROTEIN: 6.5 g/dL (ref 6.5–8.1)

## 2017-04-27 LAB — CBC WITH DIFFERENTIAL/PLATELET
Basophils Absolute: 0 10*3/uL (ref 0.0–0.1)
Basophils Relative: 0 %
EOS ABS: 0 10*3/uL (ref 0.0–0.7)
EOS PCT: 0 %
HCT: 28.7 % — ABNORMAL LOW (ref 39.0–52.0)
Hemoglobin: 9.2 g/dL — ABNORMAL LOW (ref 13.0–17.0)
LYMPHS ABS: 0.8 10*3/uL (ref 0.7–4.0)
LYMPHS PCT: 4 %
MCH: 24.7 pg — AB (ref 26.0–34.0)
MCHC: 32.1 g/dL (ref 30.0–36.0)
MCV: 77.2 fL — AB (ref 78.0–100.0)
MONOS PCT: 8 %
Monocytes Absolute: 1.6 10*3/uL — ABNORMAL HIGH (ref 0.1–1.0)
Neutro Abs: 16.6 10*3/uL — ABNORMAL HIGH (ref 1.7–7.7)
Neutrophils Relative %: 88 %
PLATELETS: 133 10*3/uL — AB (ref 150–400)
RBC: 3.72 MIL/uL — ABNORMAL LOW (ref 4.22–5.81)
RDW: 19.8 % — ABNORMAL HIGH (ref 11.5–15.5)
WBC: 19 10*3/uL — ABNORMAL HIGH (ref 4.0–10.5)

## 2017-04-27 LAB — POCT I-STAT 3, ART BLOOD GAS (G3+)
Bicarbonate: 23.6 mmol/L (ref 20.0–28.0)
O2 Saturation: 92 %
PCO2 ART: 31.7 mmHg — AB (ref 32.0–48.0)
PH ART: 7.477 — AB (ref 7.350–7.450)
Patient temperature: 36.3
TCO2: 25 mmol/L (ref 22–32)
pO2, Arterial: 57 mmHg — ABNORMAL LOW (ref 83.0–108.0)

## 2017-04-27 LAB — RENAL FUNCTION PANEL
ALBUMIN: 1.9 g/dL — AB (ref 3.5–5.0)
Anion gap: 9 (ref 5–15)
BUN: 14 mg/dL (ref 6–20)
CHLORIDE: 102 mmol/L (ref 101–111)
CO2: 26 mmol/L (ref 22–32)
CREATININE: 1.7 mg/dL — AB (ref 0.61–1.24)
Calcium: 8.1 mg/dL — ABNORMAL LOW (ref 8.9–10.3)
GFR, EST AFRICAN AMERICAN: 51 mL/min — AB (ref >60)
GFR, EST NON AFRICAN AMERICAN: 44 mL/min — AB (ref >60)
Glucose, Bld: 101 mg/dL — ABNORMAL HIGH (ref 65–99)
Phosphorus: 3 mg/dL (ref 2.5–4.6)
Potassium: 4.1 mmol/L (ref 3.5–5.1)
Sodium: 137 mmol/L (ref 135–145)

## 2017-04-27 LAB — GLUCOSE, CAPILLARY
GLUCOSE-CAPILLARY: 77 mg/dL (ref 65–99)
Glucose-Capillary: 75 mg/dL (ref 65–99)

## 2017-04-27 LAB — PROTIME-INR
INR: 3.03
Prothrombin Time: 31.1 seconds — ABNORMAL HIGH (ref 11.4–15.2)

## 2017-04-27 LAB — CULTURE, RESPIRATORY

## 2017-04-27 LAB — BILIRUBIN, FRACTIONATED(TOT/DIR/INDIR)
Bilirubin, Direct: 6.9 mg/dL — ABNORMAL HIGH (ref 0.1–0.5)
Indirect Bilirubin: 3.9 mg/dL — ABNORMAL HIGH (ref 0.3–0.9)
Total Bilirubin: 10.8 mg/dL — ABNORMAL HIGH (ref 0.3–1.2)

## 2017-04-27 LAB — BRAIN NATRIURETIC PEPTIDE: B Natriuretic Peptide: 1230.1 pg/mL — ABNORMAL HIGH (ref 0.0–100.0)

## 2017-04-27 LAB — CULTURE, RESPIRATORY W GRAM STAIN

## 2017-04-27 LAB — MAGNESIUM: MAGNESIUM: 2.6 mg/dL — AB (ref 1.7–2.4)

## 2017-04-27 LAB — LACTATE DEHYDROGENASE: LDH: 528 U/L — ABNORMAL HIGH (ref 98–192)

## 2017-04-27 LAB — PROCALCITONIN: PROCALCITONIN: 8.31 ng/mL

## 2017-04-27 LAB — PHOSPHORUS: Phosphorus: 2.2 mg/dL — ABNORMAL LOW (ref 2.5–4.6)

## 2017-04-27 MED ORDER — DOBUTAMINE IN D5W 4-5 MG/ML-% IV SOLN
5.0000 ug/kg/min | INTRAVENOUS | Status: DC
  Administered 2017-04-27 – 2017-05-01 (×3): 6 ug/kg/min via INTRAVENOUS
  Filled 2017-04-27 (×3): qty 250

## 2017-04-27 MED ORDER — SODIUM CHLORIDE 0.9 % IV SOLN
20.0000 mmol | Freq: Once | INTRAVENOUS | Status: AC
  Administered 2017-04-27: 20 mmol via INTRAVENOUS
  Filled 2017-04-27: qty 20

## 2017-04-27 MED ORDER — SODIUM CHLORIDE 0.9 % IV SOLN
1.0000 g | Freq: Two times a day (BID) | INTRAVENOUS | Status: DC
  Administered 2017-04-27 – 2017-05-04 (×14): 1 g via INTRAVENOUS
  Filled 2017-04-27 (×16): qty 1

## 2017-04-27 NOTE — Progress Notes (Signed)
Nutrition Follow-up  DOCUMENTATION CODES:   Not applicable  INTERVENTION:   Pt without adequate nutrition for 11 days recommend if unable to advance diet place Cortrak feeding tube Vital 1.5 @ 45 ml/hr (1080 ml/day) 60 ml Prostat TID Provides: 2220 kcal, 162 grams protein, and 825 ml free water.    NUTRITION DIAGNOSIS:   Increased nutrient needs related to (LVAD) as evidenced by estimated needs. Ongoing.   GOAL:   Patient will meet greater than or equal to 90% of their needs Not met.   MONITOR:   Diet advancement, I & O's  ASSESSMENT:   Pt with PMH of HLD, HTN, end-stage CHF admitted for HM-3 LVAD placement 12/27. Extubated 12/28, pt with hypotension and poor UOP CRRT started 12/30. Pt re-intubated 12/30 with worsening acidosis and respiratory distress.   1/2 extubated 1/3 Cleared by SLP to start dysphagia 1/thin diet however due to respiratory status will remain NPO for now.   Remains on CRRT and epi  Labs reviewed: PO4 2.2 (L), magnesium 2.6 (H)   Diet Order:  Diet NPO time specified  EDUCATION NEEDS:   No education needs have been identified at this time  Skin:  Skin Assessment: Skin Integrity Issues: Skin Integrity Issues:: Incisions Incisions: sternum and abdomen  Last BM:  1/3 large  Height:   Ht Readings from Last 1 Encounters:  04/19/17 '5\' 5"'$  (1.651 m)    Weight:   Wt Readings from Last 1 Encounters:  04/27/17 160 lb 4.4 oz (72.7 kg)    Ideal Body Weight:  61.8 kg  BMI:  Body mass index is 26.67 kg/m.  Estimated Nutritional Needs:   Kcal:  2100-2300  Protein:  142 -178 grams  Fluid:  >1.5 L/day  Maylon Peppers RD, LDN, CNSC [phone] Pager [phone] After Hours Pager

## 2017-04-27 NOTE — Progress Notes (Signed)
Physical Therapy Treatment Patient Details Name: David [name] MRN: [id number] DOB: 16-Apr-1966 Today's Date: 04/27/2017    History of Present Illness This 52 y.o. male admitted for LVAD. Pt extubated 12/28.   on 12/30, pt developed respiratory distress with pulmonary edema, and increased creatanine.  He was intubated 12/30 > 04/26/16, and CRRT initiated 12/30>.    PMH includes:  Acute on chronic systolic HF, CAD, PAF, CKD stage III, LBBB, frequent PVCs, seizures, PNS, MI, ischemic cardiomyopathy, s/p AICD     PT Comments    Pt able to participate again in therapy after several days of inactivity due to medical instability. Pt tolerated sitting EOB and even several minutes of standing bedside. Fatigues quickly as expected but feel he will make good progress. Based on how he did today expect he will progress well enough with mobility to be able to return home with wife.  Follow Up Recommendations  Home health PT;Supervision/Assistance - 24 hour     Equipment Recommendations  Other (comment)(rollator)    Recommendations for Other Services       Precautions / Restrictions Precautions Precautions: Fall;Sternal;Other (comment) Precaution Comments: requires min cues for sternal precautions; pt on CRRT    Mobility  Bed Mobility Overal bed mobility: Needs Assistance Bed Mobility: Supine to Sit;Sit to Supine     Supine to sit: Min assist;+2 for safety/equipment Sit to supine: Min assist;+2 for physical assistance   General bed mobility comments: assist to lift and lower trunk and assist to move LEs onto bed   Transfers Overall transfer level: Needs assistance Equipment used: None Transfers: Sit to/from [facility] Sit to Stand: Min assist;+2 safety/equipment         General transfer comment: Assist to bring hips up and for balance  Ambulation/Gait                 Stairs            Wheelchair Mobility    Modified Rankin (Stroke Patients Only)        Balance Overall balance assessment: Needs assistance Sitting-balance support: Feet supported Sitting balance-Leahy Scale: Fair     Standing balance support: Bilateral upper extremity supported Standing balance-Leahy Scale: Poor Standing balance comment: requires min A. Pt stood x 3-4 mins                             Cognition Arousal/Alertness: Awake/alert Behavior During Therapy: Flat affect;Impulsive Overall Cognitive Status: Impaired/Different from baseline Area of Impairment: Attention;Safety/judgement                   Current Attention Level: Sustained;Selective     Safety/Judgement: Decreased awareness of safety;Decreased awareness of deficits            Exercises General Exercises - Lower Extremity Long Arc Quad: AROM;Strengthening;Both;10 reps;Seated    General Comments General comments (skin integrity, edema, etc.): RR to 39 with activity      Pertinent Vitals/Pain Pain Assessment: Faces Faces Pain Scale: Hurts a little bit Pain Location: generalized  Pain Descriptors / Indicators: Grimacing Pain Intervention(s): Monitored during session    Home Living                      Prior Function            PT Goals (current goals can now be found in the care plan section) Progress towards PT goals: Progressing toward goals  Frequency    Min 3X/week      PT Plan Current plan remains appropriate    Co-evaluation PT/OT/SLP Co-Evaluation/Treatment: Yes Reason for Co-Treatment: Complexity of the patient's impairments (multi-system involvement);For patient/therapist safety;To address functional/ADL transfers PT goals addressed during session: Mobility/safety with mobility;Strengthening/ROM OT goals addressed during session: Strengthening/ROM      AM-PAC PT "6 Clicks" Daily Activity  Outcome Measure  Difficulty turning over in bed (including adjusting bedclothes, sheets and blankets)?: Unable Difficulty moving  from lying on back to sitting on the side of the bed? : Unable Difficulty sitting down on and standing up from a chair with arms (e.g., wheelchair, bedside commode, etc,.)?: Unable Help needed moving to and from a bed to chair (including a wheelchair)?: Total Help needed walking in hospital room?: Total Help needed climbing 3-5 steps with a railing? : Total 6 Click Score: 6    End of Session Equipment Utilized During Treatment: Oxygen;Other (comment) Activity Tolerance: Patient limited by fatigue Patient left: in bed;with call bell/phone within reach;Other (comment)(with respiratory in room) Nurse Communication: Mobility status PT Visit Diagnosis: Unsteadiness on feet (R26.81);Muscle weakness (generalized) (M62.81);Difficulty in walking, not elsewhere classified (R26.2)     Time: 7673-4193 PT Time Calculation (min) (ACUTE ONLY): 18 min  Charges:                       G Codes:       [facility] PT [place] 04/27/2017, 3:41 PM

## 2017-04-27 NOTE — Progress Notes (Signed)
[facility] Rounding Note Background: 52 yo WM PMH HTN, HLD, ICM with EF 20-25% previously home inotropes,  Rec'd Heartmate III 04/20/17.  AoV sewed shut 2/2 mod AI. Destination therapy with possible bridge to transplant (type O blood which will make wait list time long). Pre-op creatinine 1.3-1.4. Creatinine rose abruptly post VAD to 3.34 with oliguria. Septic hemodynamic shock, possible component pigment neph. CRRT initiated 04/23/17 for pulmonary edema/RF.  CRRT (12/30) Cards and TCTS managing fluid removal rates R [place] temp cath (12/30) All 4K fluids No heparin Effluent dose 31 ml/kg/hour today No clotting issues since 12/31   Assessment/Recommendations  1. Oliguric AKI on CKD Stage III: Likely hemodynamic hypoperfusion injury.  Can not exclude some element pigment nephropathy (UA large blood, only 6-30 RBC's). CRRT initiated 12/30.   Remains oligoanuric 1. Cards is managing fluid removal - CVP 16 - pulling fluid now 2. Continue same 4K fluids, no heparin 3. Agree with prn use of albumin to help with 3rd spp fluid mobilization if needed 4. Add daily Na Phos replacement 20 mmoles w/steady down trend in phos 2. Shock - felt vasodilatory/septic rather than cardiogenic. Improving. Now just 4 EPI/4dobutamine and MAP stable 80's/co-ox down 51 (for recheck) 3. ICM s/p VAD for end stage systolic HF. 19/14 s/p Heartmate III.  Pump speed 5300 Flow 4.5 L/min. No low flow events  4. Leukocytosis - WBC bumped back up today to 19K. All cultures neg to date. On empiric cefepime (day 4) (preop rec'd vanco, cefuroxime, rifampin and fluconazole)  5. PAF AF Amio 6. RF Re-intubated 12/30 pulm edema/resp acidosis. Extubated 1/2. On RA. Still 5 NO 7. Shock liver:  Congestive hepatopathy +/- hypoperfusion injury.  Subjective/Interval History:  Extubated yesterday Awake and alert Pressors down further CVP 16 Bladder scan 106 ml urine  Objective Vital signs in last 24 hours: Vitals:   04/27/17 0815 04/27/17  0830 04/27/17 0845 04/27/17 0900  BP:   (!) 84/74 96/80  Pulse: 62 (!) 31 77 (!) 105  Resp: (!) 36 (!) 30 (!) 34 (!) 31  Temp:      TempSrc:      SpO2: 100% 100% 100% 99%  Weight:      Height:       Weight change: 1.6 kg (3 lb 8.4 oz)  Intake/Output Summary (Last 24 hours) at 04/27/2017 0939 Last data filed at 04/27/2017 0900 Gross per 24 hour  Intake 969.75 ml  Output 1263 ml  Net -293.25 ml   Physical Exam:  Blood pressure 96/80, pulse (!) 105, temperature (!) 97.5 F (36.4 C), resp. rate (!) 31, height 5\' 5"  (1.651 m), weight 72.7 kg (160 lb 4.4 oz), SpO2 99 %.  Extubated, awake and alert R [place] temp HD cath, chest tube(s), VAD, A-line R groin (to be removed), R IJ sleeve (to be changed) MAP 89 CVP 16 Awake and alert Sclerae icteric Ant fairly clear Mechanical heart sounds, VAD hum LVAD driveline exit site dressing intact.  Abd + BS, softer (has had BM!) 1+ dependent edema LE's (thighs and upper arms) Bladder scan 102 ml   Recent Labs  Lab 04/24/17 0446 04/24/17 0816 04/24/17 1525 04/25/17 0359 04/25/17 1628 04/26/17 0343 04/26/17 1638 04/27/17 0349  NA 132* 131* 132* 133* 132* 133* 134* 136  K 4.4 4.5 4.4 4.5 4.5 4.3 4.3 4.0  CL 99* 98* 100* 101 100* 102 103 102  CO2 25 24 25 26 25 24 24 24   GLUCOSE 119* 109* 123* 123* 114* 99 89 80  BUN  23* 24* 20 19 14 15 15 16   CREATININE 2.41* 2.44* 2.26* 1.99* 1.81* 1.88* 1.74* 1.64*  CALCIUM 7.4* 7.1* 7.2* 7.6* 7.8* 7.9* 7.9* 8.1*  PHOS 3.9  3.9  --  3.2 3.5 3.4 3.1 2.4* 2.2*    Recent Labs  Lab 04/25/17 0359  04/26/17 0343 04/26/17 1638 04/27/17 0349  AST 223*  --  201*  --  149*  ALT 201*  --  187*  --  144*  ALKPHOS 152*  --  177*  --  191*  BILITOT 9.8*  --  10.1*  --  10.7*  PROT 6.0*  --  6.4*  --  6.5  ALBUMIN 1.8*   < > 1.8* 1.9* 1.9*   < > = values in this interval not displayed.    Recent Labs  Lab 04/24/17 0446 04/25/17 0359 04/26/17 0343 04/27/17 0349  WBC 19.9* 14.3* 13.5* 19.0*   NEUTROABS 17.5* 11.8* 11.2* 16.6*  HGB 9.2* 8.1* 8.9* 9.2*  HCT 28.1* 25.1* 26.9* 28.7*  MCV 77.4* 77.5* 78.4 77.2*  PLT 127* 118* 114* 133*    Recent Labs  Lab 04/26/17 1643 04/26/17 1951 04/26/17 2349 04/27/17 0406 04/27/17 0754  GLUCAP 78 82 104* 77 75   Results for David [name], David [name] (MRN [id number]) as of 04/26/2017 09:21  03/13/2017 11:30 04/21/2017 02:59 04/22/2017 03:56 04/23/2017 03:36 04/24/2017 04:46 04/25/2017 03:59 04/26/2017 03:43  LDH 205 (H) 407 (H) 576 (H) 923 (H) 967 (H) 704 (H) 612 (H)     Studies/Results: Dg Chest Port 1 View  Result Date: 04/27/2017 CLINICAL DATA:  Left ventricular assist device. EXAM: PORTABLE CHEST 1 VIEW COMPARISON:  04/26/2017, 04/24/2017, 03/10/2017, 08/02/2016. FINDINGS: Interim extubation removal of NG tube. Right IJ line and right IJ sheath in stable position. Cardiac pacer and left ventricular assist device in stable position. Prior CABG. Stable cardiomegaly. Persistent mild bilateral interstitial prominence bilateral pleural effusions suggesting CHF. Slight worsening from prior exam. No pneumothorax. IMPRESSION: 1. Interim removal of endotracheal tube and NG tube. Remaining lines and tubes stable position. Cardiac pacer left ventricular assist device in stable position. 2. Prior CABG. Cardiomegaly with persistent mild bilateral interstitial prominence and small pleural effusions suggesting mild CHF. Slight worsening from prior exam. Electronically Signed   By: Marcello Moores  Register   On: 04/27/2017 07:25   Dg Chest Port 1 View  Result Date: 04/26/2017 CLINICAL DATA:  Check endotracheal tube placement EXAM: PORTABLE CHEST 1 VIEW COMPARISON:  04/25/2017 FINDINGS: Cardiac shadow is enlarged. LVAD is again identified. Defibrillator is again seen and stable. Endotracheal tube, nasogastric catheter, right jugular sheath and right subclavian central line are again seen and stable. The Swan-Ganz catheter has been removed. The lungs are well aerated bilaterally.  Mild interstitial edema is again seen and stable. No new focal abnormality is noted. IMPRESSION: No significant change from the prior exam aside from removal of the Swan-Ganz catheter Electronically Signed   By: Inez Catalina M.D.   On: 04/26/2017 07:28   Medications: . sodium chloride Stopped (04/24/17 1701)  . sodium chloride Stopped (04/24/17 1701)  . ceFEPime (MAXIPIME) IV Stopped (04/27/17 0003)  . DOBUTamine 4 mcg/kg/min (04/26/17 1844)  . EPINEPHrine 4 mg in dextrose 5% 250 mL infusion (16 mcg/mL) 4 mcg/min (04/26/17 1844)  . lactated ringers 10 mL/hr at 04/26/17 0800  . norepinephrine (LEVOPHED) Adult infusion Stopped (04/26/17 1930)  . dialysis replacement fluid (prismasate) 400 mL/hr at 04/27/17 0431  . dialysis replacement fluid (prismasate) 200 mL/hr at 04/26/17 1624  . dialysate (PRISMASATE) 1,700  mL/hr at 04/27/17 0641  . sodium chloride     . aspirin  325 mg Per Tube Daily  . chlorhexidine  15 mL Mouth Rinse BID  . Chlorhexidine Gluconate Cloth  6 each Topical Daily  . levalbuterol  1.25 mg Nebulization Q6H  . mouth rinse  15 mL Mouth Rinse q12n4p  . pantoprazole (PROTONIX) IV  40 mg Intravenous Q24H  . sodium chloride flush  10-40 mL Intracatheter Q12H  . sodium chloride flush  3 mL Intravenous Q12H  . Warfarin - Physician Dosing Inpatient   Does not apply q1800    Jamal Maes, MD [facility] [phone] pager 04/27/2017, 9:39 AM

## 2017-04-27 NOTE — Progress Notes (Signed)
CSW attempted to visit patient at bedside although busy with nursing care. CSW informed that wife went home this morning as planned to rest and take care of business at home. Patient continues to progress with recovery. CSW will continue to be available as needed and follow throughout implant hospitalization. Raquel Sarna, [place], [place]

## 2017-04-27 NOTE — Progress Notes (Signed)
POD 7 LVAD implant , on CRRT, extubated 1/2, conts to wean dobutamine, epi, levophed.  Plan home with [facility] services when stable.

## 2017-04-27 NOTE — Progress Notes (Signed)
LVAD Coordinator Rounding Note:  Admitted12/26due to decompensated heart failure.  Heartmate IIILVAD implanted on [facility] DT[facility]. AV valve sewed shut due to aortic insufficiency.    Vital signs: Tmax:97.5 HR:90 Doppler Pressure:74 Automatic cuff: 83/64 (72) O2 Sat:100% on40% FiO2 Wt:165>164>156>160lbs   LVAD interrogation reveals:   Speed:5300 Flow:4.8 Power:4.0 PI:3.4 Alarms:none Events:no PI events overnight Hematocrit:27 Fixed speed:5300 Low speed limit:5000   Drive Line:Dressing CDI with 2 foley anchors present this AM. Date on the dressing is 04/26/17. Discussed with RN to page VAD Coordinator when wife is present to assist her with today's dressing.  Labs:  LDH trend:205>407>576>923>967>612>528  INR trend:1.34>1.52>1.73>1.95>2.32>3.03  Anticoagulation Plan: -INR Goal:2-2.5 (NO coumadin since 12/29) -ASA Dose:325 mg  Blood Products: Intra op:2 units/FFP 04/20/17  PRBC's:  1 unit 12/29 1 unit 12/30 1 unit 04/25/17  Device:Medtronic BiV -Therapies:off  Respiratory: NO at 5 LPM Extubated 12/28 Reintubated 04/23/2017 Extubated 04/26/2017  Renal:  -BUN/CRT:13/1.6, 36/3.34, 32/3.13, 23/2.41, 24/2.44, 15/1.88, 16/1.64 -Renal consulted 04/23/17> CVVHD started  Gtts: Levophed off 04/26/17 Epi74mcg/min Precedex off 04/26/17 Milrinoneoff 04/24/2017 Dobutamine 4 mcg/kg/min Vasopressin off 04/26/17   Plan/Recommendations:  1. Page VAD Coordinator when wife is here so we can practice a dressing change. 2. Please call VAD Pager for any equipment concerns.   [name] RN, VAD Coordinator 24/7 pager [phone]

## 2017-04-27 NOTE — Progress Notes (Signed)
Pharmacy Antibiotic Note  David [name] is a 52 y.o. male admitted on 04/19/2017 now s/p LVAD on 12/27. Pt given Zinacef and vancomycin x48 hr for surgical prophylaxis and changed to cefepime due to rising WBC and concern for infection. Of note, CRRT began 12/30 for worsening renal function.  WBC continues to increase to 19 after trending down. Procalcitonin has decreased from 31.15 to 8.31 today. Respiratory culture grew achromobacter (R to cefepime, cefazolin; I to ciprofloxacin). Plan to continue CRRT at this time.  Plan: -Discontinue cefepime -Change to meropenem 1 gram every 12 hours while on CRRT -Monitor cultures, progression of renal function  Height: 5\' 5"  (165.1 cm) Weight: 160 lb 4.4 oz (72.7 kg) IBW/kg (Calculated) : 61.5  Temp (24hrs), Avg:97.6 F (36.4 C), Min:96.3 F (35.7 C), Max:98.3 F (36.8 C)  Recent Labs  Lab 04/23/17 0336  04/24/17 0446  04/25/17 0359 04/25/17 1628 04/26/17 0343 04/26/17 1638 04/27/17 0349  WBC 34.7*  --  19.9*  --  14.3*  --  13.5*  --  19.0*  CREATININE 3.34*   < > 2.41*   < > 1.99* 1.81* 1.88* 1.74* 1.64*   < > = values in this interval not displayed.    Estimated Creatinine Clearance: 45.3 mL/min (A) (by C-G formula based on SCr of 1.64 mg/dL (H)).    Allergies  Allergen Reactions  . Plavix [Clopidogrel Bisulfate] Hives    Antimicrobials this admission: Zinacef 12/27 > 12/29 Vancomycin 12/27 > 12/28 Rifampin 12/27 > 12/28 Cefepime 12/30 >> 1/3 Meropenem 1/3 >>  Dose adjustments this admission: N/A  Microbiology results: 12/31 Trach aspirate: Achromobacter species (R: cefepime, cefazolin; I: cipro) 1/1 UCx: NG 12/30 BCx: NGTD 12/26 MRSA PCR: negative  Thank you for allowing pharmacy to be a part of this patient's care.  Doylene Canard, PharmD Clinical Pharmacist  Pager: [phone] Clinical Phone for 04/27/2016 until 3:30pm: [phone] If after 3:30pm, please call main pharmacy at [phone] 04/27/2017

## 2017-04-27 NOTE — CV Procedure (Signed)
Insertion of left internal jugular triple lumen central line:   Informed consent obtained Sterile prep and drape 1% lidocaine local anesthesia  Triple lumen central line inserted using Seldinger technique without difficulty. Good blood return through all 3 ports. CXR pending. Tolerated well.

## 2017-04-27 NOTE — Progress Notes (Signed)
Presented to patients room today for drive line exit wound care. This was performed under my supervision by his wife. Existing VAD dressing removed and site care performed using sterile technique. Drive line exit site cleaned with Chlora prep applicators x 2, allowed to dry, and aqaucell silver strip with daily dressing kit re-applied. Exit site red. The velour is not yet incorporated. No tenderness, drainage, foul odor or rash noted. Drive line anchor re-applied. Pt denies fever or chills.     Patients wife needs further education emphasizing how to properly don sterile gloves. Please continue to keep extra gloves from the kits to practice with the wife. She plans to read through the VAD education book while in lobby.   [name] RN, VAD Coordinator 24/7 pager [phone]

## 2017-04-27 NOTE — Progress Notes (Signed)
Patient ID: David [name], male   DOB: 10/27/1965, 52 y.o.   MRN: [id number]   Advanced Heart Failure VAD Team Note  Subjective:    David [name] is a 52 y.o. male with a PMH of ICM ('09 PCI to LAD, mRCA, '10 PCI to Lcx and '12 mRCA), HFrEF (EF ~20% for >5 years), HLD and HTN admitted 12/26 for VAD placement due to end-stage systolic HF.   Underwent successful HM-3 LVAD placement 04/20/17.  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   Extubated 12/28.  Epinephrine stopped but then UOP and co-ox dropped so restarted.  MAP lower when sitting in chair yesterday so norepinephrine restarted 12/29.  Poor UOP despite Lasix gtt and creatinine continues to rise, up to 3.34.    Nephrology consulted 04/23/17. Femoral line placed and CVVH begun.   Intubated 04/23/17 with worsening acidosis and resp distress on Bipap. Developed progressive shock with uptitration of norepinephrine and epinephrine and addition of vasopressin.  Milrinone stopped and dobutamine begun.  CXR with CHF.    Coox 51% today on Dobutamine 4 and Epi 4.  Awake and alert. Extubated 04/26/17. Denies SOB. Frustrated that he is NPO.  CRRT running even. Remains oligo/anuric. CVP ~12  LDH 407 -> 576 -> 923 -> 967 -> 704 -> 612 -> 528 INR 1.34 -> 1.52 -> 1.73 -> 1.95 -> 2.06 -> 2.32 -> 3.03 (No coumadin since 04/22/17)  Afebrile. WBC down to 13.5. CXR improving.   LFTs elevated with tbili 9.6 -> 10.1 -> 10.7. AST/ALT slowly improving.  Echo 12/31 reviewed: No significant pericardial effusion, IV septum midline to slightly to the right, RV function adequate.   LVAD INTERROGATION:  HeartMate III LVAD:  Flow 4.6 liters/min, speed 5300, power 4.0, PI 4.3. No PI events since 0420 yesterday.   Objective:    Vital Signs:   Temp:  [96.3 F (35.7 C)-98.3 F (36.8 C)] 97.5 F (36.4 C) (01/03 0500) Pulse Rate:  [25-119] 35 (01/03 0700) Resp:  [0-39] 39 (01/03 0700) BP: (85-105)/(73-85) 105/85 (01/02 2009) SpO2:  [76 %-100 %]  92 % (01/03 0700) Arterial Line BP: (84-101)/(73-86) 86/75 (01/03 0700) FiO2 (%):  [40 %] 40 % (01/02 1200) Weight:  [160 lb 4.4 oz (72.7 kg)] 160 lb 4.4 oz (72.7 kg) (01/03 0431) Last BM Date: 04/18/17 Mean arterial Pressure 88 this am  Intake/Output:   Intake/Output Summary (Last 24 hours) at 04/27/2017 0715 Last data filed at 04/27/2017 0700 Gross per 24 hour  Intake 1047.95 ml  Output 1297 ml  Net -249.05 ml     Physical Exam   GENERAL: Awake and alert. NAD HEENT: Normal. NECK: Supple, JVP 7-8 cm. Carotids OK.  CARDIAC:  Mechanical heart sounds with LVAD hum present.  LUNGS:  Clear anteriorly ABDOMEN:  NT, ND, no HSM. No bruits or masses. +BS  LVAD exit site: Dressing dry and intact. No erythema or drainage. Stabilization device present and accurately applied. Driveline dressing changed daily per sterile technique. EXTREMITIES:  Warm and dry. No cyanosis, clubbing, or rash. Trace ankle edema NEUROLOGIC:  Awake and alert. Moves all four extremities without difficulty.     Telemetry   Afib 90-100s, personally reviewed.  Labs   Basic Metabolic Panel: Recent Labs  Lab 04/23/17 0336  04/24/17 0446  04/25/17 0359 04/25/17 1628 04/26/17 0343 04/26/17 1638 04/27/17 0349  NA 126*   < > 132*   < > 133* 132* 133* 134* 136  K 3.8   < > 4.4   < >  4.5 4.5 4.3 4.3 4.0  CL 95*   < > 99*   < > 101 100* 102 103 102  CO2 19*   < > 25   < > 26 25 24 24 24   GLUCOSE 129*   < > 119*   < > 123* 114* 99 89 80  BUN 36*   < > 23*   < > 19 14 15 15 16   CREATININE 3.34*   < > 2.41*   < > 1.99* 1.81* 1.88* 1.74* 1.64*  CALCIUM 7.8*   < > 7.4*   < > 7.6* 7.8* 7.9* 7.9* 8.1*  MG 2.5*  --  2.3  --  2.5*  --  2.7*  --  2.6*  PHOS 5.5*   < > 3.9  3.9   < > 3.5 3.4 3.1 2.4* 2.2*   < > = values in this interval not displayed.    Liver Function Tests: Recent Labs  Lab 04/23/17 0336  04/24/17 0816  04/25/17 0359 04/25/17 1628 04/26/17 0343 04/26/17 1638 04/27/17 0349  AST 319*  --  256*   --  223*  --  201*  --  149*  ALT 208*  --  212*  --  201*  --  187*  --  144*  ALKPHOS 95  --  128*  --  152*  --  177*  --  191*  BILITOT 9.6*  --  10.4*  --  9.8*  --  10.1*  --  10.7*  PROT 5.7*  --  6.0*  --  6.0*  --  6.4*  --  6.5  ALBUMIN 2.2*   < > 1.9*   < > 1.8* 1.8* 1.8* 1.9* 1.9*   < > = values in this interval not displayed.   No results for input(s): LIPASE, AMYLASE in the last 168 hours. No results for input(s): AMMONIA in the last 168 hours.  CBC: Recent Labs  Lab 04/23/17 0336 04/23/17 2241 04/24/17 0446 04/25/17 0359 04/26/17 0343 04/27/17 0349  WBC 34.7*  --  19.9* 14.3* 13.5* 19.0*  NEUTROABS 31.9*  --  17.5* 11.8* 11.2* 16.6*  HGB 8.8* 8.3* 9.2* 8.1* 8.9* 9.2*  HCT 26.4* 25.4* 28.1* 25.1* 26.9* 28.7*  MCV 77.0*  --  77.4* 77.5* 78.4 77.2*  PLT 143*  --  127* 118* 114* 133*    INR: Recent Labs  Lab 04/23/17 0336 04/24/17 0446 04/25/17 0359 04/26/17 0343 04/27/17 0349  INR 1.73 1.95 2.06 2.32 3.03    Other results:  Imaging   Dg Chest Port 1 View  Result Date: 04/26/2017 CLINICAL DATA:  Check endotracheal tube placement EXAM: PORTABLE CHEST 1 VIEW COMPARISON:  04/25/2017 FINDINGS: Cardiac shadow is enlarged. LVAD is again identified. Defibrillator is again seen and stable. Endotracheal tube, nasogastric catheter, right jugular sheath and right subclavian central line are again seen and stable. The Swan-Ganz catheter has been removed. The lungs are well aerated bilaterally. Mild interstitial edema is again seen and stable. No new focal abnormality is noted. IMPRESSION: No significant change from the prior exam aside from removal of the Swan-Ganz catheter Electronically Signed   By: Inez Catalina M.D.   On: 04/26/2017 07:28     Medications:     Scheduled Medications: . aspirin  325 mg Per Tube Daily  . bisacodyl  10 mg Rectal Daily   Or  . bisacodyl  10 mg Oral Daily  . chlorhexidine  15 mL Mouth Rinse BID  . Chlorhexidine Gluconate Cloth  6  each Topical Daily  . insulin aspart  0-24 Units Subcutaneous Q4H  . levalbuterol  1.25 mg Nebulization Q6H  . mouth rinse  15 mL Mouth Rinse q12n4p  . pantoprazole (PROTONIX) IV  40 mg Intravenous Q24H  . sodium chloride flush  10-40 mL Intracatheter Q12H  . sodium chloride flush  3 mL Intravenous Q12H  . Warfarin - Physician Dosing Inpatient   Does not apply q1800    Infusions: . sodium chloride Stopped (04/24/17 1701)  . sodium chloride Stopped (04/24/17 1701)  . ceFEPime (MAXIPIME) IV Stopped (04/27/17 0003)  . DOBUTamine 4 mcg/kg/min (04/26/17 1844)  . EPINEPHrine 4 mg in dextrose 5% 250 mL infusion (16 mcg/mL) 4 mcg/min (04/26/17 1844)  . lactated ringers 10 mL/hr at 04/26/17 0800  . norepinephrine (LEVOPHED) Adult infusion Stopped (04/26/17 1930)  . dialysis replacement fluid (prismasate) 400 mL/hr at 04/27/17 0431  . dialysis replacement fluid (prismasate) 200 mL/hr at 04/26/17 1624  . dialysate (PRISMASATE) 1,700 mL/hr at 04/27/17 0641  . sodium chloride      PRN Medications: sodium chloride, heparin, morphine injection, ondansetron (ZOFRAN) IV, oxyCODONE, sodium chloride, sodium chloride flush, sodium chloride flush   Patient Profile   David [name] is a 52 y.o. male with a PMH of ICM ('09 PCI to LAD, mRCA, '10 PCI to Lcx and '12 mRCA), HFrEF (EF ~20% for >5 years) s/p HM-III VAD placement on 12/27 due to end-stage systolic HF.   Assessment/Plan:    1. Acute on chronic systolic HF: Echo 25/42/70 with LVEF 20-25%, Mild MR, Mild/Mod MR, Severe LAE, Mildly reduced RV, PA peak pressure 56 mm Hg.  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 Extubated, initially stopped epinephrine/norepinephrine but restarted with fall in UOP and MAP.  With progressive renal failure, pulmonary edema, and hypotension, he was re-intubated on 12/30.   CVVH was begun.  12/31 was difficult day with refractory shock (MAP in 60s).  Reviewed 12/31 echo which showed no  significant pericardial effusion, adequate RV function, and IV septum near mid-line. Given suspected for vasodilatory/septic shock with PCT 31 04/24/17, he is covered with cefepime.   - CO 51% this am on dobutamine 4 and epi 4. CVP ~12 - CVVH running even. No PI events since 0400 yesterday. Per Dr Cyndia Bent to try and pull off slowly this am.  - Continue to wean pressors.  - He is on ASA 325 currently, has not been getting warfarin (INR rose with elevated LFTs).  - Warfarin goal INR 2-2.5. INR up to 3.03 and last coumadin dose was 12/29.   David [name] pulled 04/25/17.  2. CAD s/p PCI to mid RCA and PLOM with DES x 2 and DES to mid LAD in 1/18.   - Continue ASA and statin. No change.  3. PAF: Suspect atrial fibrillation but rate is controlled. He is on po amiodarone but do not think this caused rise in LFTs.  No change. 4. ARF on CKD stage III: Suspect some degree of intrinsic renal disease prior to LVAD placement (baseline creatinine around 1.5) with probable ATN from intra-op/peri-op hypotension and development of vasodilatory/septic shock.   - Remains on CVVHD, keeping even. To try and pull this am.  - May need to trial on lasix as remains oligo/anuric. Will discuss with MD 5. ABLA, expected: Got 1 unit PRBCs on 12/30.  - Hgb 9.2 s/p 1 u PRBCs 04/25/17.  6. ID: Started on cefepime empirically with septic/vasodilatory shock, PCT was 31 04/25/17  Cultures (blood, sputum) NGTD   - WBCs up to 19 this am. MAP much improved.  - PCT trending down.   7. Elevated LFTs: - Tbili to 10.7. Continues to rise despite improvement of MAPs.   Will discuss diuresis strategy with MD.  Labs show a mixed pictures with improved LFTs, but worsening WBCs, increased total bili, and elevated INR.   Length of Stay: 76 David [name]  David [name] 04/27/2017, 7:15 AM  VAD Team --- VAD ISSUES ONLY--- Pager [phone] (7am - 7am)  Advanced Heart Failure Team  Pager [phone] (M-F; 7a - 4p)  Please contact [facility] for  night-coverage after hours (4p -7a ) and weekends on [email or web address]  Agree.   Her remains critically ill but beginning to improved slowly. Now extubated. Currently on epi 4 and dobutamine 4 with improved MAPs but co-ox low 51% -> 43% CVP 12. Remains anuric but bladder scan suggests 120cc urine in the bladder Will continue CVVHD at 50/hr. Bilirubin remains at 11 with conjugated > unconjugated suggesting partial cholestatic picture.   On exam Sitting up in bed NAD RIJ Trialysis cath Cor + HM-3 hum Lungs decreased BS Ab distended hypoactive BS NT Ext warm mild edema Neuro normal  Improving somewhat today but still very tenuous. MAPs improved but co-ox low. Increase dobutamine to 6. Continue epi. Continue CVVHD at -50. Continue broad spectrum abx. No change to VAD speed at this time. Driveline site ok. LDH coming down.   CRITICAL CARE Performed by: Glori Bickers  Total critical care time: 35 minutes  Critical care time was exclusive of separately billable procedures and treating other patients.  Critical care was necessary to treat or prevent imminent or life-threatening deterioration.  Critical care was time spent personally by me (independent of midlevel providers or residents) on the following activities: development of treatment plan with patient and/or surrogate as well as nursing, discussions with consultants, evaluation of patient's response to treatment, examination of patient, obtaining history from patient or surrogate, ordering and performing treatments and interventions, ordering and review of laboratory studies, ordering and review of radiographic studies, pulse oximetry and re-evaluation of patient's condition.  Glori Bickers, MD  2:12 PM

## 2017-04-27 NOTE — Progress Notes (Signed)
Occupational Therapy Treatment Patient Details Name: David [name] MRN: [id number] DOB: 30-Dec-1965 Today's Date: 04/27/2017    History of present illness This 52 y.o. male admitted for LVAD. Pt extubated 12/28.   on 12/30, pt developed respiratory distress with pulmonary edema, and increased creatanine.  He was intubated 12/30 > 04/26/16, and CRRT initiated 12/30>.    PMH includes:  Acute on chronic systolic HF, CAD, PAF, CKD stage III, LBBB, frequent PVCs, seizures, PNS, MI, ischemic cardiomyopathy, s/p AICD    OT comments  Pt seen with PT.  Pt moved to EOB with min A +2 for safety.  He was able to sit EOB x ~8 mins with close min guard assist, and stood x 1 with min A +2 x  ~3 mins.  Pt fatigued, with RR mid - high 30s with activity.  Unable to obtain accurate BP reading.   Pt returned to supine with min A +2.   He requires min cues for sternal precautions.  Will continue to follow.   Follow Up Recommendations  No OT follow up;Supervision/Assistance - 24 hour    Equipment Recommendations  Tub/shower seat    Recommendations for Other Services      Precautions / Restrictions Precautions Precautions: Fall;Sternal;Other (comment) Precaution Comments: requires min cues for sternal precautions        Mobility Bed Mobility Overal bed mobility: Needs Assistance Bed Mobility: Supine to Sit;Sit to Supine     Supine to sit: Min assist;+2 for safety/equipment Sit to supine: Min assist;+2 for physical assistance   General bed mobility comments: assist to lift and lower trunk and assist to move LEs onto bed   Transfers Overall transfer level: Needs assistance   Transfers: Sit to/from Stand;Stand Pivot Transfers Sit to Stand: Min assist              Balance Overall balance assessment: Needs assistance Sitting-balance support: Feet supported Sitting balance-Leahy Scale: Fair     Standing balance support: Bilateral upper extremity supported Standing balance-Leahy Scale:  Poor Standing balance comment: requires min A. Pt stood x 3-4 mins                            ADL either performed or assessed with clinical judgement   ADL                                               Vision       Perception     Praxis      Cognition Arousal/Alertness: Awake/alert Behavior During Therapy: Flat affect;Impulsive Overall Cognitive Status: Impaired/Different from baseline Area of Impairment: Attention;Safety/judgement                   Current Attention Level: Sustained;Selective     Safety/Judgement: Decreased awareness of safety;Decreased awareness of deficits              Exercises     Shoulder Instructions       General Comments RR to 39 with activity.       Pertinent Vitals/ Pain       Pain Assessment: Faces Faces Pain Scale: Hurts a little bit Pain Location: generalized  Pain Descriptors / Indicators: Grimacing Pain Intervention(s): Monitored during session  Home Living  Prior Functioning/Environment              Frequency  Min 2X/week        Progress Toward Goals  OT Goals(current goals can now be found in the care plan section)  Progress towards OT goals: Not progressing toward goals - comment(medical issues )     Plan Discharge plan remains appropriate    Co-evaluation    PT/OT/SLP Co-Evaluation/Treatment: Yes Reason for Co-Treatment: Complexity of the patient's impairments (multi-system involvement);For patient/therapist safety;To address functional/ADL transfers   OT goals addressed during session: Strengthening/ROM      AM-PAC PT "6 Clicks" Daily Activity     Outcome Measure   Help from another person eating meals?: None Help from another person taking care of personal grooming?: A Little Help from another person toileting, which includes using toliet, bedpan, or urinal?: Total Help from another person  bathing (including washing, rinsing, drying)?: A Lot Help from another person to put on and taking off regular upper body clothing?: A Lot Help from another person to put on and taking off regular lower body clothing?: A Lot 6 Click Score: 14    End of Session Equipment Utilized During Treatment: Oxygen  OT Visit Diagnosis: Unsteadiness on feet (R26.81);Muscle weakness (generalized) (M62.81)   Activity Tolerance Patient limited by fatigue   Patient Left in bed;with call bell/phone within reach   Nurse Communication Mobility status        Time: 8592-9244 OT Time Calculation (min): 17 min  Charges: OT General Charges $OT Visit: 1 Visit OT Treatments $Therapeutic Activity: 8-22 mins  [facility], OTR/L [phone]    [name] M 04/27/2017, 3:14 PM

## 2017-04-27 NOTE — Progress Notes (Signed)
[facility] team continued to follow Mr. Bring course of care/treatment inpatient to support [facility] needs upon DC to home.   If patient discharges after hours, please call [phone].   [name] 04/27/2017, 11:39 AM

## 2017-04-27 NOTE — Progress Notes (Signed)
Patient ID: David [name], male   DOB: 1965-07-22, 52 y.o.   MRN: [id number] HeartMate 3 Rounding Note  Subjective:    Feels ok. Slept overnight. Had several BM's. No pain.  PO2 decreased to 57 this am with sat 92% and drop in Co-ox to 51 which is most likely PO2 related.  MAP has been stable in the 80's to 90 range off Levophed. Still on dobut 4, epi 4. CVP 10-13  LVAD INTERROGATION:  HeartMate IIl LVAD:  Flow 4.7 liters/min, speed 5300, power 4.0, PI 3.4.  No PI or suction events.   Objective:    Vital Signs:   Temp:  [96.3 F (35.7 C)-98.3 F (36.8 C)] 97.5 F (36.4 C) (01/03 0500) Pulse Rate:  [25-119] 35 (01/03 0700) Resp:  [0-39] 39 (01/03 0700) BP: (85-105)/(73-85) 105/85 (01/02 2009) SpO2:  [76 %-100 %] 92 % (01/03 0700) Arterial Line BP: (84-101)/(73-86) 86/75 (01/03 0700) FiO2 (%):  [40 %] 40 % (01/02 1200) Weight:  [72.7 kg (160 lb 4.4 oz)] 72.7 kg (160 lb 4.4 oz) (01/03 0431) Last BM Date: 04/18/17 Mean arterial Pressure 80's  Intake/Output:   Intake/Output Summary (Last 24 hours) at 04/27/2017 0742 Last data filed at 04/27/2017 0700 Gross per 24 hour  Intake 1047.95 ml  Output 1297 ml  Net -249.05 ml     Physical Exam: General:  Calm and good spirits.  No resp difficulty but RR in the 30's HEENT: normal Neck: right IJ sleeve Cor: Normal heart sounds with LVAD hum present. Lungs: clear Abdomen: soft, nontender, nondistended. hypoactive bowel sounds. Extremities: mild edema Neuro: alert & orientedx3,  moves all 4 extremities w/o difficulty. Affect pleasant  Telemetry: atrial fib 104  Labs: Basic Metabolic Panel: Recent Labs  Lab 04/23/17 0336  04/24/17 0446  04/25/17 0359 04/25/17 1628 04/26/17 0343 04/26/17 1638 04/27/17 0349  NA 126*   < > 132*   < > 133* 132* 133* 134* 136  K 3.8   < > 4.4   < > 4.5 4.5 4.3 4.3 4.0  CL 95*   < > 99*   < > 101 100* 102 103 102  CO2 19*   < > 25   < > 26 25 24 24 24   GLUCOSE 129*   < > 119*   < > 123* 114*  99 89 80  BUN 36*   < > 23*   < > 19 14 15 15 16   CREATININE 3.34*   < > 2.41*   < > 1.99* 1.81* 1.88* 1.74* 1.64*  CALCIUM 7.8*   < > 7.4*   < > 7.6* 7.8* 7.9* 7.9* 8.1*  MG 2.5*  --  2.3  --  2.5*  --  2.7*  --  2.6*  PHOS 5.5*   < > 3.9  3.9   < > 3.5 3.4 3.1 2.4* 2.2*   < > = values in this interval not displayed.    Liver Function Tests: Recent Labs  Lab 04/23/17 0336  04/24/17 0816  04/25/17 0359 04/25/17 1628 04/26/17 0343 04/26/17 1638 04/27/17 0349  AST 319*  --  256*  --  223*  --  201*  --  149*  ALT 208*  --  212*  --  201*  --  187*  --  144*  ALKPHOS 95  --  128*  --  152*  --  177*  --  191*  BILITOT 9.6*  --  10.4*  --  9.8*  --  10.1*  --  10.7*  PROT 5.7*  --  6.0*  --  6.0*  --  6.4*  --  6.5  ALBUMIN 2.2*   < > 1.9*   < > 1.8* 1.8* 1.8* 1.9* 1.9*   < > = values in this interval not displayed.   No results for input(s): LIPASE, AMYLASE in the last 168 hours. No results for input(s): AMMONIA in the last 168 hours.  CBC: Recent Labs  Lab 04/23/17 0336 04/23/17 2241 04/24/17 0446 04/25/17 0359 04/26/17 0343 04/27/17 0349  WBC 34.7*  --  19.9* 14.3* 13.5* 19.0*  NEUTROABS 31.9*  --  17.5* 11.8* 11.2* 16.6*  HGB 8.8* 8.3* 9.2* 8.1* 8.9* 9.2*  HCT 26.4* 25.4* 28.1* 25.1* 26.9* 28.7*  MCV 77.0*  --  77.4* 77.5* 78.4 77.2*  PLT 143*  --  127* 118* 114* 133*    INR: Recent Labs  Lab 04/23/17 0336 04/24/17 0446 04/25/17 0359 04/26/17 0343 04/27/17 0349  INR 1.73 1.95 2.06 2.32 3.03    Other results:  EKG:   Imaging: Dg Chest Port 1 View  Result Date: 04/27/2017 CLINICAL DATA:  Left ventricular assist device. EXAM: PORTABLE CHEST 1 VIEW COMPARISON:  04/26/2017, 04/24/2017, 03/10/2017, 08/02/2016. FINDINGS: Interim extubation removal of NG tube. Right IJ line and right IJ sheath in stable position. Cardiac pacer and left ventricular assist device in stable position. Prior CABG. Stable cardiomegaly. Persistent mild bilateral interstitial  prominence bilateral pleural effusions suggesting CHF. Slight worsening from prior exam. No pneumothorax. IMPRESSION: 1. Interim removal of endotracheal tube and NG tube. Remaining lines and tubes stable position. Cardiac pacer left ventricular assist device in stable position. 2. Prior CABG. Cardiomegaly with persistent mild bilateral interstitial prominence and small pleural effusions suggesting mild CHF. Slight worsening from prior exam. Electronically Signed   By: Marcello Moores  Register   On: 04/27/2017 07:25   Dg Chest Port 1 View  Result Date: 04/26/2017 CLINICAL DATA:  Check endotracheal tube placement EXAM: PORTABLE CHEST 1 VIEW COMPARISON:  04/25/2017 FINDINGS: Cardiac shadow is enlarged. LVAD is again identified. Defibrillator is again seen and stable. Endotracheal tube, nasogastric catheter, right jugular sheath and right subclavian central line are again seen and stable. The Swan-Ganz catheter has been removed. The lungs are well aerated bilaterally. Mild interstitial edema is again seen and stable. No new focal abnormality is noted. IMPRESSION: No significant change from the prior exam aside from removal of the Swan-Ganz catheter Electronically Signed   By: Inez Catalina M.D.   On: 04/26/2017 07:28      Medications:     Scheduled Medications: . aspirin  325 mg Per Tube Daily  . bisacodyl  10 mg Rectal Daily   Or  . bisacodyl  10 mg Oral Daily  . chlorhexidine  15 mL Mouth Rinse BID  . Chlorhexidine Gluconate Cloth  6 each Topical Daily  . insulin aspart  0-24 Units Subcutaneous Q4H  . levalbuterol  1.25 mg Nebulization Q6H  . mouth rinse  15 mL Mouth Rinse q12n4p  . pantoprazole (PROTONIX) IV  40 mg Intravenous Q24H  . sodium chloride flush  10-40 mL Intracatheter Q12H  . sodium chloride flush  3 mL Intravenous Q12H  . Warfarin - Physician Dosing Inpatient   Does not apply q1800     Infusions: . sodium chloride Stopped (04/24/17 1701)  . sodium chloride Stopped (04/24/17 1701)  .  ceFEPime (MAXIPIME) IV Stopped (04/27/17 0003)  . DOBUTamine 4 mcg/kg/min (04/26/17 1844)  . EPINEPHrine 4 mg in dextrose 5% 250  mL infusion (16 mcg/mL) 4 mcg/min (04/26/17 1844)  . lactated ringers 10 mL/hr at 04/26/17 0800  . norepinephrine (LEVOPHED) Adult infusion Stopped (04/26/17 1930)  . dialysis replacement fluid (prismasate) 400 mL/hr at 04/27/17 0431  . dialysis replacement fluid (prismasate) 200 mL/hr at 04/26/17 1624  . dialysate (PRISMASATE) 1,700 mL/hr at 04/27/17 0641  . sodium chloride       PRN Medications:  sodium chloride, heparin, morphine injection, ondansetron (ZOFRAN) IV, oxyCODONE, sodium chloride, sodium chloride flush, sodium chloride flush   Assessment:   1. Ischemic cardiomyopathy s/p multiple PCI's with chronic systolic heart failure and EF 20% on home dobutamine preop with significant improvement in symptoms. Evaluated at [facility] for heart transplant but LVAD felt to be the best treatment for him at this time. POD7s/p HeartMate III LVAD. He is critically ill with multi-system organ failure but improving. Hemodynamics have improved markedly over the past few days correlating with improvement in inflammatory markers. WBC down to 13.5 yesterday but up a little today to 19, PCT down further to 8.31. 2.H/OHypertension 3.H/OHyperlipidemia 4. PAF in sinuspreopon amiodarone and Eliquis.Now back in atrial fib.Amio stopped. 5.H/OStage III CKD. Acute on chronic postop renal failurewith anuria now on CRRT.  6. LBBB s/p CRT-D upgrade in 10/2016 without improvement in symptoms or EF. LV epicardial leads cut off at the time of surgery. 7. Prior smoking with mild obstruction and severe diffusion defect on PFT's. CXRshowsslightly more edema than yesterday.  Oxygenation is down a little from yesterday but may improve with IS and getting up out of bed.  8. Postop leukocytosis and fever. I suspect this is inflammatory occurring early postopbut with his complicated  coursehe wasstartedantibiotics. CXR much improved.All cultures negative so far. Will change central line today. 9. Marked rise in bilirubin and transaminases. Likely due to pump runand right sided congestion, hypotension.stable bili, transaminases improving.  10. INR is 3.03 today after one small dose of Coumadin several days ago. Hold off on further anticoagulation for now. LDH is down to 528.    Plan/Discussion:    CVP is 14 now so I think he will tolerate removing some volume again. This may help oxygenation. Encourage IS. Remove femoral arterial line and get out of bed to chair. I would continue epi and dobutamine at current level today since MAP is good. I think the drop in Co-ox is probably due to drop in PO2. VAD flow has been good. Continue Maxipime for now. Change central line. Speech therapy to see him today for swallowing evaluation before starting po's.He is improved overall but still tenuous and we don't know why he decompensated before.   I reviewed the LVAD parameters from today, and compared the results to the patient's prior recorded data.  No programming changes were made.  The LVAD is functioning within specified parameters.   LVAD interrogation was negative for any significant power changes, alarms or PI events/speed drops.  LVAD equipment check completed and is in good working order.  Back-up equipment present.   LVAD education done on emergency procedures and precautions and reviewed exit site care.  Length of Stay: 8  Gaye Pollack 04/27/2017, 7:42 AM

## 2017-04-27 NOTE — Evaluation (Signed)
Clinical/Bedside Swallow Evaluation Patient Details  Name: David [name] MRN: [id number] Date of Birth: [birth year]  Today's Date: 04/27/2017 Time: SLP Start Time (ACUTE ONLY): 0908 SLP Stop Time (ACUTE ONLY): 0350 SLP Time Calculation (min) (ACUTE ONLY): 14 min  Past Medical History:  Past Medical History:  Diagnosis Date  . AICD (automatic cardioverter/defibrillator) present   . Anemia   . Anxiety   . CAD (coronary artery disease) 2009   AMI in 12/2007 with PCI to LAD, staged PCI to  mid/distal RCA, NSTEMI in 02/2009 with BMS to LCx  . CHF (congestive heart failure) ([place])   . Chronic kidney disease 11/03/2016   stage 3 kidney disease  . Dysrhythmia    "arrythmia problems at some point", "fatal rhythms"  . History of blood transfusion    "I was bleeding from was rectum" (04/19/2017)  . HLD (hyperlipidemia)   . HTN (hypertension)   . Ischemic cardiomyopathy    Admitted in 07/2010 with CHF exacerbation  . Myocardial infarction ([place])    "I've had 4" (04/19/2017)  . Pneumonia 2018 X 1  . Seizures ([place])    one seizure in 04/2016 during cardiac event (04/19/2017)   Past Surgical History:  Past Surgical History:  Procedure Laterality Date  . AORTIC VALVE REPAIR N/A 04/20/2017   Procedure: AORTIC VALVE REPAIR;  Surgeon: Gaye Pollack, MD;  Location: [place];  Service: Open Heart Surgery;  Laterality: N/A;  . BIV ICD INSERTION CRT-D N/A 08/01/2016   Procedure: BiV ICD Insertion CRT-D;  Surgeon: Will Meredith Leeds, MD;  Location: [place] [facility];  Service: Cardiovascular;  Laterality: N/A;  . CARDIAC CATHETERIZATION N/A 05/05/2016   Procedure: Right/Left Heart Cath and Coronary Angiography;  Surgeon: Jolaine Artist, MD;  Location: [place] [facility];  Service: Cardiovascular;  Laterality: N/A;  . CARDIAC CATHETERIZATION N/A 05/09/2016   Procedure: Coronary Stent Intervention;  Surgeon: Peter M Martinique, MD;  Location: [place] [facility];  Service: Cardiovascular;  Laterality: N/A;  .  CARDIAC CATHETERIZATION N/A 05/09/2016   Procedure: Intravascular Pressure Wire/FFR Study;  Surgeon: Peter M Martinique, MD;  Location: [place] [facility];  Service: Cardiovascular;  Laterality: N/A;  . CORONARY ANGIOPLASTY WITH STENT PLACEMENT     "i've got a total of 12 stents" (04/19/2017)  . ELECTROPHYSIOLOGY STUDY N/A 06/24/2016   Procedure: Electrophysiology Study;  Surgeon: Deboraha Sprang, MD;  Location: [place] [facility];  Service: Cardiovascular;  Laterality: N/A;  . EPICARDIAL PACING LEAD PLACEMENT Left 11/07/2016   Procedure: LV EPICARDIAL PACING LEAD PLACEMENT VIA LEFT MINI THORACOTOMY;  Surgeon: Gaye Pollack, MD;  Location: [place];  Service: Thoracic;  Laterality: Left;  . ICD IMPLANT N/A 06/24/2016   Procedure: POSSIBLE  ICD Implant;  Surgeon: Deboraha Sprang, MD;  Location: [place] [facility];  Service: Cardiovascular;  Laterality: N/A;  . INSERTION OF IMPLANTABLE LEFT VENTRICULAR ASSIST DEVICE N/A 04/20/2017   Procedure: INSERTION OF IMPLANTABLE LEFT VENTRICULAR ASSIST DEVICE, Aortic Valve repair;  Surgeon: Gaye Pollack, MD;  Location: [facility];  Service: Open Heart Surgery;  Laterality: N/A;  Heartmate 3  . IR FLUORO GUIDE CV MIDLINE PICC RIGHT  03/14/2017  . IR US GUIDE VASC ACCESS RIGHT  03/14/2017  . LEFT HEART CATHETERIZATION WITH CORONARY ANGIOGRAM N/A 03/13/2011   Procedure: LEFT HEART CATHETERIZATION WITH CORONARY ANGIOGRAM;  Surgeon: Lorretta Harp, MD;  Location[facility] CATH LAB;  Service: Cardiovascular;  Laterality: N/A;  . MULTIPLE EXTRACTIONS WITH ALVEOLOPLASTY N/A 04/12/2017   Procedure:  Extraction of tooth #'s 2, 5-12, 17, 20-29 with alveoloplasty amd maxillary right buccal exostoses reductions.;  Surgeon: Lenn Cal, DDS;  Location: [place];  Service: Oral Surgery;  Laterality: N/A;  . RIGHT HEART CATH N/A 04/19/2017   Procedure: RIGHT HEART CATH;  Surgeon: Jolaine Artist, MD;  Location: [place] [facility];  Service: Cardiovascular;  Laterality: N/A;  .  RIGHT/LEFT HEART CATH AND CORONARY ANGIOGRAPHY N/A 03/10/2017   Procedure: RIGHT/LEFT HEART CATH AND CORONARY ANGIOGRAPHY;  Surgeon: Jolaine Artist, MD;  Location: [place] [facility];  Service: Cardiovascular;  Laterality: N/A;  . TEE WITHOUT CARDIOVERSION N/A 04/20/2017   Procedure: TRANSESOPHAGEAL ECHOCARDIOGRAM (TEE);  Surgeon: Gaye Pollack, MD;  Location: [place];  Service: Open Heart Surgery;  Laterality: N/A;   HPI:  David [name] a 52 y.o.malewith a PMH of ICM (Ischemic Cardiomyopathy) HLD, HTN, Afib/AFL,  Brady/PEA arrest  04/29/16, cath on 05/05/16 with 3V CAD, stent placement, left thoracotomy and insertion of two LV epicardial leads and revision of his CRT system to BiV on 7/16/201, dental extraction week of 04/05/17. Admitted 12/26 for  VAD. Intubated 12/27 for procedure, extubated 12/28, reintubated 12/30 due to decreased responsiveness and extubated 1/2 (12:30). CXR Cardiomegaly with persistent mild bilateral interstitial prominence and small pleural effusions suggesting mild CHF. Slight worsening from prior exam.   Assessment / Plan / Recommendation Clinical Impression  Pt denies odonophagia following intubation/re-intubation total of 6 days with adequate vocal quality and minimal audible congestion during assessment. Consistent immediate and delayed throat clears particularly following cup sip water trials and mild increase work of breathing. Recent total dental extraction 04/12/17. Objective evaluation warranted given intubation, LVAD and clinical signs with FEES scheduled today at 1300.   SLP Visit Diagnosis: Dysphagia, unspecified (R13.10)    Aspiration Risk  Moderate aspiration risk    Diet Recommendation Ice chips PRN after oral care        Other  Recommendations Oral Care Recommendations: Oral care QID   Follow up Recommendations (TBD)      Frequency and Duration            Prognosis        Swallow Study   General HPI: David [name] a 52  y.o.malewith a PMH of ICM (Ischemic Cardiomyopathy) HLD, HTN, Afib/AFL,  Brady/PEA arrest  04/29/16, cath on 05/05/16 with 3V CAD, stent placement, left thoracotomy and insertion of two LV epicardial leads and revision of his CRT system to BiV on 7/16/201, dental extraction week of 04/05/17. Admitted 12/26 for  VAD. Intubated 12/27 for procedure, extubated 12/28, reintubated 12/30 due to decreased responsiveness and extubated 1/2 (12:30). CXR Cardiomegaly with persistent mild bilateral interstitial prominence and small pleural effusions suggesting mild CHF. Slight worsening from prior exam. Type of Study: Bedside Swallow Evaluation Previous Swallow Assessment: (none) Diet Prior to this Study: NPO Temperature Spikes Noted: No Respiratory Status: Nasal cannula History of Recent Intubation: Yes Length of Intubations (days): (total 6) Date extubated: 04/26/17(1st extubation 12/28 (1 day)) Behavior/Cognition: Alert;Cooperative;Pleasant mood Oral Cavity Assessment: Other (comment)(mild lingual candidias) Oral Care Completed by SLP: Yes Oral Cavity - Dentition: Edentulous(dental extraction 04/12/17) Vision: Functional for self-feeding Self-Feeding Abilities: Able to feed self Patient Positioning: Upright in bed Baseline Vocal Quality: Normal Volitional Cough: Weak Volitional Swallow: Able to elicit    Oral/Motor/Sensory Function Overall Oral Motor/Sensory Function: Within functional limits   Ice Chips Ice chips: Within functional limits Presentation: Spoon   Thin Liquid Thin Liquid: Impaired Presentation: Cup Pharyngeal  Phase Impairments:  Change in Vital Signs;Throat Clearing - Immediate;Throat Clearing - Delayed(mild increased work of breathing)    Nectar Thick Nectar Thick Liquid: Not tested   Honey Thick Honey Thick Liquid: Not tested   Puree Puree: Impaired Presentation: Spoon;Self Fed Pharyngeal Phase Impairments: Throat Clearing - Delayed   Solid   GO   Solid: Not tested         Houston Siren 04/27/2017,9:42 AM  Orbie Pyo Colvin Caroli.Ed [employer] [phone]

## 2017-04-27 NOTE — Procedures (Signed)
Objective Swallowing Evaluation: Type of Study: FEES-Fiberoptic Endoscopic Evaluation of Swallow   Patient Details  Name: David [name] MRN: [id number] Date of Birth: Feb 27, 1966  Today's Date: 04/27/2017 Time: SLP Start Time (ACUTE ONLY): 1316 -SLP Stop Time (ACUTE ONLY): 1340  SLP Time Calculation (min) (ACUTE ONLY): 24 min   Past Medical History:  Past Medical History:  Diagnosis Date  . AICD (automatic cardioverter/defibrillator) present   . Anemia   . Anxiety   . CAD (coronary artery disease) 2009   AMI in 12/2007 with PCI to LAD, staged PCI to  mid/distal RCA, NSTEMI in 02/2009 with BMS to LCx  . CHF (congestive heart failure) ([place])   . Chronic kidney disease 11/03/2016   stage 3 kidney disease  . Dysrhythmia    "arrythmia problems at some point", "fatal rhythms"  . History of blood transfusion    "I was bleeding from was rectum" (04/19/2017)  . HLD (hyperlipidemia)   . HTN (hypertension)   . Ischemic cardiomyopathy    Admitted in 07/2010 with CHF exacerbation  . Myocardial infarction ([place])    "I've had 4" (04/19/2017)  . Pneumonia 2018 X 1  . Seizures ([place])    one seizure in 04/2016 during cardiac event (04/19/2017)   Past Surgical History:  Past Surgical History:  Procedure Laterality Date  . AORTIC VALVE REPAIR N/A 04/20/2017   Procedure: AORTIC VALVE REPAIR;  Surgeon: Gaye Pollack, MD;  Location: [place];  Service: Open Heart Surgery;  Laterality: N/A;  . BIV ICD INSERTION CRT-D N/A 08/01/2016   Procedure: BiV ICD Insertion CRT-D;  Surgeon: Will Meredith Leeds, MD;  Location: [place] [facility];  Service: Cardiovascular;  Laterality: N/A;  . CARDIAC CATHETERIZATION N/A 05/05/2016   Procedure: Right/Left Heart Cath and Coronary Angiography;  Surgeon: Jolaine Artist, MD;  Location: [place] [facility];  Service: Cardiovascular;  Laterality: N/A;  . CARDIAC CATHETERIZATION N/A 05/09/2016   Procedure: Coronary Stent Intervention;  Surgeon: Peter M Martinique, MD;   Location: [place] [facility];  Service: Cardiovascular;  Laterality: N/A;  . CARDIAC CATHETERIZATION N/A 05/09/2016   Procedure: Intravascular Pressure Wire/FFR Study;  Surgeon: Peter M Martinique, MD;  Location: [place] [facility];  Service: Cardiovascular;  Laterality: N/A;  . CORONARY ANGIOPLASTY WITH STENT PLACEMENT     "i've got a total of 12 stents" (04/19/2017)  . ELECTROPHYSIOLOGY STUDY N/A 06/24/2016   Procedure: Electrophysiology Study;  Surgeon: Deboraha Sprang, MD;  Location: [place] [facility];  Service: Cardiovascular;  Laterality: N/A;  . EPICARDIAL PACING LEAD PLACEMENT Left 11/07/2016   Procedure: LV EPICARDIAL PACING LEAD PLACEMENT VIA LEFT MINI THORACOTOMY;  Surgeon: Gaye Pollack, MD;  Location: [facility];  Service: Thoracic;  Laterality: Left;  . ICD IMPLANT N/A 06/24/2016   Procedure: POSSIBLE  ICD Implant;  Surgeon: Deboraha Sprang, MD;  Location: [facility];  Service: Cardiovascular;  Laterality: N/A;  . INSERTION OF IMPLANTABLE LEFT VENTRICULAR ASSIST DEVICE N/A 04/20/2017   Procedure: INSERTION OF IMPLANTABLE LEFT VENTRICULAR ASSIST DEVICE, Aortic Valve repair;  Surgeon: Gaye Pollack, MD;  Location: [facility];  Service: Open Heart Surgery;  Laterality: N/A;  Heartmate 3  . IR FLUORO GUIDE CV MIDLINE PICC RIGHT  03/14/2017  . IR US GUIDE VASC ACCESS RIGHT  03/14/2017  . LEFT HEART CATHETERIZATION WITH CORONARY ANGIOGRAM N/A 03/13/2011   Procedure: LEFT HEART CATHETERIZATION WITH CORONARY ANGIOGRAM;  Surgeon: Lorretta Harp, MD;  Location: [facility] CATH LAB;  Service: Cardiovascular;  Laterality:  N/A;  . MULTIPLE EXTRACTIONS WITH ALVEOLOPLASTY N/A 04/12/2017   Procedure: Extraction of tooth #'s 2, 5-12, 17, 20-29 with alveoloplasty amd maxillary right buccal exostoses reductions.;  Surgeon: Lenn Cal, DDS;  Location: [place] [place];  Service: Oral Surgery;  Laterality: N/A;  . RIGHT HEART CATH N/A 04/19/2017   Procedure: RIGHT HEART CATH;  Surgeon: Jolaine Artist, MD;  Location:  [place] [facility];  Service: Cardiovascular;  Laterality: N/A;  . RIGHT/LEFT HEART CATH AND CORONARY ANGIOGRAPHY N/A 03/10/2017   Procedure: RIGHT/LEFT HEART CATH AND CORONARY ANGIOGRAPHY;  Surgeon: Jolaine Artist, MD;  Location: [place] [facility];  Service: Cardiovascular;  Laterality: N/A;  . TEE WITHOUT CARDIOVERSION N/A 04/20/2017   Procedure: TRANSESOPHAGEAL ECHOCARDIOGRAM (TEE);  Surgeon: Gaye Pollack, MD;  Location: [place];  Service: Open Heart Surgery;  Laterality: N/A;   HPI: David [name] a 52 y.o.malewith a PMH of ICM (Ischemic Cardiomyopathy) HLD, HTN, Afib/AFL,  Brady/PEA arrest  04/29/16, cath on 05/05/16 with 3V CAD, stent placement, left thoracotomy and insertion of two LV epicardial leads and revision of his CRT system to BiV on 7/16/201, dental extraction week of 04/05/17. Admitted 12/26 for  VAD. Intubated 12/27 for procedure, extubated 12/28, reintubated 12/30 due to decreased responsiveness and extubated 1/2 (12:30). CXR Cardiomegaly with persistent mild bilateral interstitial prominence and small pleural effusions suggesting mild CHF. Slight worsening from prior exam.   No Data Recorded   Assessment / Plan / Recommendation  CHL IP CLINICAL IMPRESSIONS 04/27/2017  Clinical Impression Pt without significant edema of laryngeal structures with adequate vocal cord adduction during phonation. Swallow initiation was appropriate/timely. Throat clears present without observation of po's above or under glottis during the swallow or after with cued coughs. Mild pyriform sinus residue and pt noted to be dyspneic during assessemnt. Recommend initiate Dys 1, thin, straws allowed, sit upright and pills whole in applesauce. Discussed with MD who prefers to wait until pt improves from a respiratory standpoint. RR in the 30's reaching 41. ST will follow along.     SLP Visit Diagnosis Dysphagia, pharyngeal phase (R13.13)  Attention and concentration deficit following --  Frontal lobe  and executive function deficit following --  Impact on safety and function Mild aspiration risk;Moderate aspiration risk      CHL IP TREATMENT RECOMMENDATION 04/27/2017  Treatment Recommendations Therapy as outlined in treatment plan below     Prognosis 04/27/2017  Prognosis for Safe Diet Advancement Good  Barriers to Reach Goals --  Barriers/Prognosis Comment --    CHL IP DIET RECOMMENDATION 04/27/2017  SLP Diet Recommendations Thin liquid;Dysphagia 1 (Puree) solids  Liquid Administration via Cup;Straw  Medication Administration Whole meds with puree  Compensations Slow rate;Small sips/bites  Postural Changes Seated upright at 90 degrees      CHL IP OTHER RECOMMENDATIONS 04/27/2017  Recommended Consults --  Oral Care Recommendations Oral care BID  Other Recommendations --      CHL IP FOLLOW UP RECOMMENDATIONS 04/27/2017  Follow up Recommendations (No Data)      CHL IP FREQUENCY AND DURATION 04/27/2017  Speech Therapy Frequency (ACUTE ONLY) min 2x/week  Treatment Duration 2 weeks           CHL IP ORAL PHASE 04/27/2017  Oral Phase WFL  Oral - Pudding Teaspoon --  Oral - Pudding Cup --  Oral - Honey Teaspoon --  Oral - Honey Cup --  Oral - Nectar Teaspoon --  Oral - Nectar Cup --  Oral - Nectar Straw --  Oral - Thin Teaspoon --  Oral - Thin Cup --  Oral - Thin Straw --  Oral - Puree --  Oral - Mech Soft --  Oral - Regular --  Oral - Multi-Consistency --  Oral - Pill --  Oral Phase - Comment --    CHL IP PHARYNGEAL PHASE 04/27/2017  Pharyngeal Phase Impaired  Pharyngeal- Pudding Teaspoon --  Pharyngeal --  Pharyngeal- Pudding Cup --  Pharyngeal --  Pharyngeal- Honey Teaspoon --  Pharyngeal --  Pharyngeal- Honey Cup --  Pharyngeal --  Pharyngeal- Nectar Teaspoon --  Pharyngeal --  Pharyngeal- Nectar Cup --  Pharyngeal --  Pharyngeal- Nectar Straw --  Pharyngeal --  Pharyngeal- Thin Teaspoon --  Pharyngeal --  Pharyngeal- Thin Cup Pharyngeal residue - pyriform   Pharyngeal --  Pharyngeal- Thin Straw --  Pharyngeal --  Pharyngeal- Puree Pharyngeal residue - pyriform  Pharyngeal --  Pharyngeal- Mechanical Soft --  Pharyngeal --  Pharyngeal- Regular --  Pharyngeal --  Pharyngeal- Multi-consistency --  Pharyngeal --  Pharyngeal- Pill --  Pharyngeal --  Pharyngeal Comment --     CHL IP CERVICAL ESOPHAGEAL PHASE 04/27/2017  Cervical Esophageal Phase WFL  Pudding Teaspoon --  Pudding Cup --  Honey Teaspoon --  Honey Cup --  Nectar Teaspoon --  Nectar Cup --  Nectar Straw --  Thin Teaspoon --  Thin Cup --  Thin Straw --  Puree --  Mechanical Soft --  Regular --  Multi-consistency --  Pill --  Cervical Esophageal Comment --    No flowsheet data found.  Houston Siren 04/27/2017, 2:09 PM  Orbie Pyo Colvin Caroli.Ed [employer] [phone]

## 2017-04-28 ENCOUNTER — Inpatient Hospital Stay (HOSPITAL_COMMUNITY): Payer: Medicare HMO

## 2017-04-28 ENCOUNTER — Other Ambulatory Visit (HOSPITAL_COMMUNITY): Payer: Self-pay | Admitting: Internal Medicine

## 2017-04-28 DIAGNOSIS — I5023 Acute on chronic systolic (congestive) heart failure: Secondary | ICD-10-CM

## 2017-04-28 DIAGNOSIS — Z95811 Presence of heart assist device: Secondary | ICD-10-CM

## 2017-04-28 LAB — MAGNESIUM: Magnesium: 2.6 mg/dL — ABNORMAL HIGH (ref 1.7–2.4)

## 2017-04-28 LAB — COMPREHENSIVE METABOLIC PANEL
ALT: 112 U/L — AB (ref 17–63)
AST: 119 U/L — AB (ref 15–41)
Albumin: 1.8 g/dL — ABNORMAL LOW (ref 3.5–5.0)
Alkaline Phosphatase: 209 U/L — ABNORMAL HIGH (ref 38–126)
Anion gap: 7 (ref 5–15)
BUN: 13 mg/dL (ref 6–20)
CHLORIDE: 103 mmol/L (ref 101–111)
CO2: 26 mmol/L (ref 22–32)
CREATININE: 1.69 mg/dL — AB (ref 0.61–1.24)
Calcium: 8 mg/dL — ABNORMAL LOW (ref 8.9–10.3)
GFR, EST AFRICAN AMERICAN: 52 mL/min — AB (ref >60)
GFR, EST NON AFRICAN AMERICAN: 45 mL/min — AB (ref >60)
Glucose, Bld: 96 mg/dL (ref 65–99)
Potassium: 4 mmol/L (ref 3.5–5.1)
Sodium: 136 mmol/L (ref 135–145)
TOTAL PROTEIN: 6.5 g/dL (ref 6.5–8.1)
Total Bilirubin: 10.7 mg/dL — ABNORMAL HIGH (ref 0.3–1.2)

## 2017-04-28 LAB — CBC
HCT: 27.7 % — ABNORMAL LOW (ref 39.0–52.0)
HEMOGLOBIN: 8.9 g/dL — AB (ref 13.0–17.0)
MCH: 25 pg — ABNORMAL LOW (ref 26.0–34.0)
MCHC: 32.1 g/dL (ref 30.0–36.0)
MCV: 77.8 fL — AB (ref 78.0–100.0)
PLATELETS: 181 10*3/uL (ref 150–400)
RBC: 3.56 MIL/uL — AB (ref 4.22–5.81)
RDW: 20.5 % — ABNORMAL HIGH (ref 11.5–15.5)
WBC: 21.4 10*3/uL — ABNORMAL HIGH (ref 4.0–10.5)

## 2017-04-28 LAB — PROTIME-INR
INR: 3.8
Prothrombin Time: 37.1 seconds — ABNORMAL HIGH (ref 11.4–15.2)

## 2017-04-28 LAB — RENAL FUNCTION PANEL
Albumin: 1.8 g/dL — ABNORMAL LOW (ref 3.5–5.0)
Anion gap: 8 (ref 5–15)
BUN: 12 mg/dL (ref 6–20)
CHLORIDE: 101 mmol/L (ref 101–111)
CO2: 26 mmol/L (ref 22–32)
CREATININE: 1.61 mg/dL — AB (ref 0.61–1.24)
Calcium: 8 mg/dL — ABNORMAL LOW (ref 8.9–10.3)
GFR calc non Af Amer: 47 mL/min — ABNORMAL LOW (ref >60)
GFR, EST AFRICAN AMERICAN: 55 mL/min — AB (ref >60)
GLUCOSE: 124 mg/dL — AB (ref 65–99)
Phosphorus: 1.9 mg/dL — ABNORMAL LOW (ref 2.5–4.6)
Potassium: 4 mmol/L (ref 3.5–5.1)
SODIUM: 135 mmol/L (ref 135–145)

## 2017-04-28 LAB — COOXEMETRY PANEL
Carboxyhemoglobin: 1.7 % — ABNORMAL HIGH (ref 0.5–1.5)
Methemoglobin: 1 % (ref 0.0–1.5)
O2 SAT: 58.8 %
TOTAL HEMOGLOBIN: 11.6 g/dL — AB (ref 12.0–16.0)

## 2017-04-28 LAB — LACTATE DEHYDROGENASE: LDH: 492 U/L — AB (ref 98–192)

## 2017-04-28 LAB — PHOSPHORUS: PHOSPHORUS: 2.3 mg/dL — AB (ref 2.5–4.6)

## 2017-04-28 MED ORDER — SODIUM GLYCEROPHOSPHATE 1 MMOLE/ML IV SOLN
20.0000 mmol | Freq: Once | INTRAVENOUS | Status: AC
  Administered 2017-04-28: 20 mmol via INTRAVENOUS
  Filled 2017-04-28: qty 20

## 2017-04-28 MED ORDER — ASPIRIN 325 MG PO TABS
325.0000 mg | ORAL_TABLET | Freq: Every day | ORAL | Status: DC
  Administered 2017-04-28 – 2017-04-30 (×3): 325 mg via ORAL
  Filled 2017-04-28 (×3): qty 1

## 2017-04-28 MED ORDER — PANTOPRAZOLE SODIUM 40 MG PO TBEC
40.0000 mg | DELAYED_RELEASE_TABLET | Freq: Every day | ORAL | Status: DC
  Administered 2017-04-28 – 2017-05-27 (×30): 40 mg via ORAL
  Filled 2017-04-28 (×31): qty 1

## 2017-04-28 MED ORDER — ENSURE ENLIVE PO LIQD
237.0000 mL | Freq: Three times a day (TID) | ORAL | Status: DC
  Administered 2017-04-28 – 2017-05-09 (×21): 237 mL via ORAL

## 2017-04-28 MED ORDER — SODIUM CHLORIDE 0.9 % IV SOLN
INTRAVENOUS | Status: DC
  Administered 2017-04-28: 13:00:00 via INTRAVENOUS

## 2017-04-28 NOTE — Progress Notes (Signed)
RT attempted to stick patient for ABG, pt pulled away and refuses to allow [place] to attempt again despite my explanation of the importance of the test.

## 2017-04-28 NOTE — Progress Notes (Signed)
Admit: 04/19/2017 LOS: 9  68M with sCHF and ICM s/p HM3 LVAD 40/97 complicated by shock on pressors, respoiratory failure.  Current CRRT Prescription: Start Date: 04/23/17  Catheter: R [place] Temp HD cath BFR: 200 4K Pre Blood Pump: 400 4K DFR: 1700 4K  Replacement Rate: 200 4K Goal UF: 0-23mL hr net neg Anticoagulation: no heparin in circuit Clotting: infrequent   S: Wife at bedside this AM, discussed renal issues Pulling 30mL hr net as BP permits CVP 12 this AM Remains on NE/Epi + Dobutamin Anuric; minimal fluid on bladder scans   O: 01/03 0701 - 01/04 0700 In: 1235.4 [I.V.:713.4; IV Piggyback:522] Out: 2397   Filed Weights   04/26/17 0600 04/27/17 0431 04/28/17 0500  Weight: 71.1 kg (156 lb 12 oz) 72.7 kg (160 lb 4.4 oz) 72.7 kg (160 lb 4.4 oz)    Recent Labs  Lab 04/27/17 0349 04/27/17 1610 04/28/17 0400  NA 136 137 136  K 4.0 4.1 4.0  CL 102 102 103  CO2 24 26 26   GLUCOSE 80 101* 96  BUN 16 14 13   CREATININE 1.64* 1.70* 1.69*  CALCIUM 8.1* 8.1* 8.0*  PHOS 2.2* 3.0 2.3*   Recent Labs  Lab 04/25/17 0359 04/26/17 0343 04/27/17 0349 04/28/17 0400  WBC 14.3* 13.5* 19.0* 21.4*  NEUTROABS 11.8* 11.2* 16.6*  --   HGB 8.1* 8.9* 9.2* 8.9*  HCT 25.1* 26.9* 28.7* 27.7*  MCV 77.5* 78.4 77.2* 77.8*  PLT 118* 114* 133* 181    Scheduled Meds: . aspirin  325 mg Per Tube Daily  . chlorhexidine  15 mL Mouth Rinse BID  . Chlorhexidine Gluconate Cloth  6 each Topical Daily  . levalbuterol  1.25 mg Nebulization Q6H  . mouth rinse  15 mL Mouth Rinse q12n4p  . pantoprazole (PROTONIX) IV  40 mg Intravenous Q24H  . sodium chloride flush  10-40 mL Intracatheter Q12H  . sodium chloride flush  3 mL Intravenous Q12H  . Warfarin - Physician Dosing Inpatient   Does not apply q1800   Continuous Infusions: . sodium chloride Stopped (04/24/17 1701)  . sodium chloride Stopped (04/24/17 1701)  . DOBUTamine 6 mcg/kg/min (04/27/17 1552)  . EPINEPHrine 4 mg in dextrose 5% 250  mL infusion (16 mcg/mL) 4 mcg/min (04/27/17 1552)  . lactated ringers 10 mL/hr at 04/26/17 0800  . meropenem (MERREM) IV Stopped (04/28/17 0452)  . norepinephrine (LEVOPHED) Adult infusion 1 mcg/min (04/28/17 0600)  . dialysis replacement fluid (prismasate) 400 mL/hr at 04/28/17 0612  . dialysis replacement fluid (prismasate) 200 mL/hr at 04/26/17 1624  . dialysate (PRISMASATE) 1,700 mL/hr at 04/28/17 0616  . sodium chloride     PRN Meds:.sodium chloride, heparin, morphine injection, ondansetron (ZOFRAN) IV, oxyCODONE, sodium chloride, sodium chloride flush, sodium chloride flush  ABG    Component Value Date/Time   PHART 7.477 (H) 04/27/2017 0351   PCO2ART 31.7 (L) 04/27/2017 0351   PO2ART 57.0 (L) 04/27/2017 0351   HCO3 23.6 04/27/2017 0351   TCO2 25 04/27/2017 0351   ACIDBASEDEF 1.0 04/24/2017 0926   O2SAT 58.8 04/28/2017 0400    A 1. 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 Pigment nephropathy  2. ICM s/p LVAD 12/27 3. Shock on NE/Epi per AHF + TCTS 4. VDRF, now extubated 5. Hypophosphatemia, repleted 1/3; follow 6. Elevated bilirubin; elevated transaminates, improving  P 1. Cont CRRT at current settings.    Pearson Grippe, MD [facility] pgr [phone]

## 2017-04-28 NOTE — Progress Notes (Signed)
Physical Therapy Treatment Patient Details Name: David [name] MRN: [id number] DOB: [birth year] Today's Date: 04/28/2017    History of Present Illness This 52 y.o. male admitted for LVAD. Pt extubated 12/28.   on 12/30, pt developed respiratory distress with pulmonary edema, and increased creatanine.  He was intubated 12/30 > 04/26/16, and CRRT initiated 12/30>.    PMH includes:  Acute on chronic systolic HF, CAD, PAF, CKD stage III, LBBB, frequent PVCs, seizures, PNS, MI, ischemic cardiomyopathy, s/p AICD     PT Comments    Pt moving well for OOB to chair. Unable to amb due to CRRT. Expect pt to continue to make good progress as medical status improves.   Follow Up Recommendations  Home health PT;Supervision/Assistance - 24 hour     Equipment Recommendations  Other (comment)(rollator)    Recommendations for Other Services       Precautions / Restrictions Precautions Precautions: Fall;Sternal;Other (comment) Precaution Comments: requires min cues for sternal precautions; pt on CRRT    Mobility  Bed Mobility Overal bed mobility: Needs Assistance Bed Mobility: Supine to Sit     Supine to sit: Min assist;+2 for safety/equipment;HOB elevated     General bed mobility comments: Assist for safety and to manage lines/tubes. Verbal cues for sternal precautions  Transfers Overall transfer level: Needs assistance Equipment used: 2 person hand held assist Transfers: Sit to/from Stand Sit to Stand: Min assist;+2 safety/equipment         General transfer comment: Assist to bring hips up and for balance and to manage lines  Ambulation/Gait Ambulation/Gait assistance: +2 physical assistance;Min assist Ambulation Distance (Feet): 3 Feet Assistive device: 2 person hand held assist Gait Pattern/deviations: Step-through pattern;Decreased step length - right;Decreased step length - left;Trunk flexed Gait velocity: decr Gait velocity interpretation: Below normal speed for  age/gender General Gait Details: assist for balance and support   Stairs            Wheelchair Mobility    Modified Rankin (Stroke Patients Only)       Balance Overall balance assessment: Needs assistance Sitting-balance support: Feet supported Sitting balance-Leahy Scale: Fair     Standing balance support: Bilateral upper extremity supported Standing balance-Leahy Scale: Poor Standing balance comment: Hand held assist for static standing                            Cognition Arousal/Alertness: Awake/alert Behavior During Therapy: WFL for tasks assessed/performed Overall Cognitive Status: Impaired/Different from baseline Area of Impairment: Attention;Safety/judgement                   Current Attention Level: Sustained;Selective     Safety/Judgement: Decreased awareness of safety;Decreased awareness of deficits            Exercises      General Comments General comments (skin integrity, edema, etc.): VSS during treatment      Pertinent Vitals/Pain Pain Assessment: Faces Faces Pain Scale: Hurts a little bit Pain Location: generalized  Pain Descriptors / Indicators: Grimacing Pain Intervention(s): Monitored during session    Home Living                      Prior Function            PT Goals (current goals can now be found in the care plan section) Progress towards PT goals: Progressing toward goals    Frequency    Min 3X/week      PT  Plan Current plan remains appropriate    Co-evaluation              AM-PAC PT "6 Clicks" Daily Activity  Outcome Measure  Difficulty turning over in bed (including adjusting bedclothes, sheets and blankets)?: Unable Difficulty moving from lying on back to sitting on the side of the bed? : Unable Difficulty sitting down on and standing up from a chair with arms (e.g., wheelchair, bedside commode, etc,.)?: Unable Help needed moving to and from a bed to chair (including a  wheelchair)?: A Little Help needed walking in hospital room?: A Little Help needed climbing 3-5 steps with a railing? : Total 6 Click Score: 10    End of Session Equipment Utilized During Treatment: Oxygen Activity Tolerance: Patient tolerated treatment well Patient left: with call bell/phone within reach;in chair;with nursing/sitter in room Nurse Communication: Mobility status PT Visit Diagnosis: Unsteadiness on feet (R26.81);Muscle weakness (generalized) (M62.81);Difficulty in walking, not elsewhere classified (R26.2)     Time: 3614-4315 PT Time Calculation (min) (ACUTE ONLY): 16 min  Charges:  $Therapeutic Activity: 8-22 mins                    G Codes:       [facility] PT [place] 04/28/2017, 10:22 AM

## 2017-04-28 NOTE — Progress Notes (Signed)
LVAD Coordinator Rounding Note:  Admitted12/26due to decompensated heart failure.  Heartmate IIILVAD [facility].AV valve sewed shut due to aortic insufficiency.   Vital signs: Tmax:97.5 HR:105 Doppler Pressure:80 Automatic cuff: 89/75 (81) O2 Sat:98% on 6LNC Wt:165>164>156>160>160lbs   LVAD interrogation reveals:   Speed:5300 Flow:4.6 Power:4.0 PI:4.6 Alarms:none Events:no PI events since 04/26/17 Hematocrit:28 Fixed speed:5300 Low speed limit:5000   Drive Line:Dressing dry and intact  with 2 foley anchors present this AM. Small stain noted on dressing.   Labs:  LDH trend:205>407>576>923>967>612>528>492  INR trend:1.34>1.52>1.73>1.95>2.32>3.03>3.80  Anticoagulation Plan: -INR Goal:2-2.5 (NO coumadin since 12/29) -ASA Dose:325 mg  Blood Products: Intra op:2 units/FFP 04/20/17  PRBC's:  1 unit 12/29 1 unit 12/30 1 unit 04/25/17  Device:Medtronic BiV -Therapies:off  Respiratory: NO at 5 LPM Extubated 12/28 Reintubated 04/23/2017 Extubated 04/26/2017  Renal:  -BUN/CRT:13/1.6, 36/3.34, 32/3.13, 23/2.41, 24/2.44, 15/1.88, 16/1.64 -Renal consulted 04/23/17>CVVHD started  Gtts: Levophedoff 04/26/17, restarted 04/28/17 at 1 mcg/kg/min Epi85mcg/min Precedex off 04/26/17 Milrinoneoff 04/24/2017 Dobutamine 6 mcg/kg/min Vasopressinoff 04/26/17   Plan/Recommendations:  1. Will arrange to change dressing with wife. 2. Please call VAD Pager for any equipment concerns.   [name] RN, VAD Coordinator 24/7 pager [phone]

## 2017-04-28 NOTE — Progress Notes (Signed)
Per order, nitric now turned off.  RN in room and aware. No distress currently noted, pt is sleeping.  VSS.

## 2017-04-28 NOTE — Progress Notes (Signed)
Nitric weaned per MD order. Now at 3.7ppm, RN aware.

## 2017-04-28 NOTE — Progress Notes (Signed)
Presented to patients room today for drive line exit wound care. This was performed under my supervision by his wife. Existing VAD dressing removed and site care performed using sterile technique. Drive line exit site cleaned with Chlora prep applicators x 2, allowed to dry, and aqaucell silver strip with daily dressing kit re-applied. Exit site is red with small amount bloody drainage. The velour is not yet incorporated. No tenderness, drainage, foul odor or rash noted. Drive line anchor re-applied. Pt denies fever or chills.     Please continue to practice donning sterile gloves with patients wife through the weekend as well as daily dressing changes. Please page if there are any concerns or questions.  [name] RN, VAD Coordinator 24/7 pager [phone]

## 2017-04-28 NOTE — Progress Notes (Signed)
Patient ID: David [name], male   DOB: 10-11-1965, 52 y.o.   MRN: [id number] HeartMate 3 Rounding Note  Subjective:    No complaints this am. Hemodynamics fairly stable. Had to be put back on low dose levophed overnight for drop in MAP to 76. On dobutamine 6, epi 4, NO  Co-ox is 59 this am. Could not get arterial ABG.  LVAD INTERROGATION:  HeartMate IIl LVAD:  Flow 4.6 liters/min, speed 5300, power 4, PI 3.8.  No PI events  Objective:    Vital Signs:   Temp:  [97.5 F (36.4 C)-98.4 F (36.9 C)] 97.5 F (36.4 C) (01/04 0804) Pulse Rate:  [25-106] 96 (01/04 0800) Resp:  [9-40] 28 (01/04 0800) BP: (76-130)/(40-98) 101/89 (01/04 0800) SpO2:  [61 %-100 %] 90 % (01/04 0800) Arterial Line BP: (85-110)/(71-94) 98/83 (01/03 1130) Weight:  [72.7 kg (160 lb 4.4 oz)] 72.7 kg (160 lb 4.4 oz) (01/04 0500) Last BM Date: 04/27/17 Mean arterial Pressure 80  Intake/Output:   Intake/Output Summary (Last 24 hours) at 04/28/2017 0847 Last data filed at 04/28/2017 0800 Gross per 24 hour  Intake 1270.65 ml  Output 2451 ml  Net -1180.35 ml     Physical Exam: General:  Jaundiced,  No resp difficulty, RR 20's HEENT: normal Cor: Normal heart sounds with LVAD hum present. Lungs: clear Abdomen: soft, nontender, nondistended. No hepatosplenomegaly. Good bowel sounds. Extremities: mild edema Neuro: alert & orientedx3, moves all 4 extremities w/o difficulty. Affect pleasant  Telemetry: atrial fib 100  Labs: Basic Metabolic Panel: Recent Labs  Lab 04/24/17 0446  04/25/17 0359  04/26/17 0343 04/26/17 1638 04/27/17 0349 04/27/17 1610 04/28/17 0400  NA 132*   < > 133*   < > 133* 134* 136 137 136  K 4.4   < > 4.5   < > 4.3 4.3 4.0 4.1 4.0  CL 99*   < > 101   < > 102 103 102 102 103  CO2 25   < > 26   < > 24 24 24 26 26   GLUCOSE 119*   < > 123*   < > 99 89 80 101* 96  BUN 23*   < > 19   < > 15 15 16 14 13   CREATININE 2.41*   < > 1.99*   < > 1.88* 1.74* 1.64* 1.70* 1.69*  CALCIUM 7.4*   < >  7.6*   < > 7.9* 7.9* 8.1* 8.1* 8.0*  MG 2.3  --  2.5*  --  2.7*  --  2.6*  --  2.6*  PHOS 3.9  3.9   < > 3.5   < > 3.1 2.4* 2.2* 3.0 2.3*   < > = values in this interval not displayed.    Liver Function Tests: Recent Labs  Lab 04/24/17 0816  04/25/17 0359  04/26/17 0343 04/26/17 1638 04/27/17 0349 04/27/17 1050 04/27/17 1610 04/28/17 0400  AST 256*  --  223*  --  201*  --  149*  --   --  119*  ALT 212*  --  201*  --  187*  --  144*  --   --  112*  ALKPHOS 128*  --  152*  --  177*  --  191*  --   --  209*  BILITOT 10.4*  --  9.8*  --  10.1*  --  10.7* 10.8*  --  10.7*  PROT 6.0*  --  6.0*  --  6.4*  --  6.5  --   --  6.5  ALBUMIN 1.9*   < > 1.8*   < > 1.8* 1.9* 1.9*  --  1.9* 1.8*   < > = values in this interval not displayed.   No results for input(s): LIPASE, AMYLASE in the last 168 hours. No results for input(s): AMMONIA in the last 168 hours.  CBC: Recent Labs  Lab 04/23/17 0336  04/24/17 0446 04/25/17 0359 04/26/17 0343 04/27/17 0349 04/28/17 0400  WBC 34.7*  --  19.9* 14.3* 13.5* 19.0* 21.4*  NEUTROABS 31.9*  --  17.5* 11.8* 11.2* 16.6*  --   HGB 8.8*   < > 9.2* 8.1* 8.9* 9.2* 8.9*  HCT 26.4*   < > 28.1* 25.1* 26.9* 28.7* 27.7*  MCV 77.0*  --  77.4* 77.5* 78.4 77.2* 77.8*  PLT 143*  --  127* 118* 114* 133* 181   < > = values in this interval not displayed.    INR: Recent Labs  Lab 04/24/17 0446 04/25/17 0359 04/26/17 0343 04/27/17 0349 04/28/17 0400  INR 1.95 2.06 2.32 3.03 3.80    Other results:  EKG:   Imaging: Dg Chest Port 1 View  Result Date: 04/28/2017 CLINICAL DATA:  Left ventricular assistance device. EXAM: PORTABLE CHEST 1 VIEW COMPARISON:  Radiograph of April 27, 2017. FINDINGS: Stable cardiomegaly. Left ventricular assistance device is unchanged in position. Left-sided pacemaker is unchanged in position. Right subclavian catheter is unchanged. Left internal jugular catheter is unchanged. No pneumothorax is noted. Stable bibasilar  atelectasis or edema is noted. Bony thorax is unremarkable. IMPRESSION: Stable position of left ventricular assistance device. Stable support apparatus. Stable bibasilar atelectasis or edema. Electronically Signed   By: Marijo Conception, M.D.   On: 04/28/2017 07:47   Dg Chest Port 1 View  Result Date: 04/27/2017 CLINICAL DATA:  Central line placement. EXAM: PORTABLE CHEST 1 VIEW COMPARISON:  April 27, 2017 FINDINGS: A new left central line has been placed. The distal tip appears to be in the central SVC. The right IJ sheath and right subclavian line are stable. No pneumothorax. The LVAD device is stable as is the pacemaker. Interstitial opacities in the lungs, probably edema, are unchanged. No other interval changes. Stable cardiomegaly. IMPRESSION: The new left central line is in good position with no pneumothorax. Other support apparatus is stable. Persistent edema and cardiomegaly. Electronically Signed   By: Dorise Bullion III M.D   On: 04/27/2017 13:28   Dg Chest Port 1 View  Result Date: 04/27/2017 CLINICAL DATA:  Left ventricular assist device. EXAM: PORTABLE CHEST 1 VIEW COMPARISON:  04/26/2017, 04/24/2017, 03/10/2017, 08/02/2016. FINDINGS: Interim extubation removal of NG tube. Right IJ line and right IJ sheath in stable position. Cardiac pacer and left ventricular assist device in stable position. Prior CABG. Stable cardiomegaly. Persistent mild bilateral interstitial prominence bilateral pleural effusions suggesting CHF. Slight worsening from prior exam. No pneumothorax. IMPRESSION: 1. Interim removal of endotracheal tube and NG tube. Remaining lines and tubes stable position. Cardiac pacer left ventricular assist device in stable position. 2. Prior CABG. Cardiomegaly with persistent mild bilateral interstitial prominence and small pleural effusions suggesting mild CHF. Slight worsening from prior exam. Electronically Signed   By: Marcello Moores  Register   On: 04/27/2017 07:25      Medications:      Scheduled Medications: . aspirin  325 mg Per Tube Daily  . chlorhexidine  15 mL Mouth Rinse BID  . Chlorhexidine Gluconate Cloth  6 each Topical Daily  . levalbuterol  1.25 mg Nebulization Q6H  . mouth  rinse  15 mL Mouth Rinse q12n4p  . pantoprazole (PROTONIX) IV  40 mg Intravenous Q24H  . sodium chloride flush  10-40 mL Intracatheter Q12H  . sodium chloride flush  3 mL Intravenous Q12H  . Warfarin - Physician Dosing Inpatient   Does not apply q1800     Infusions: . sodium chloride Stopped (04/24/17 1701)  . sodium chloride Stopped (04/24/17 1701)  . DOBUTamine 6 mcg/kg/min (04/28/17 0800)  . EPINEPHrine 4 mg in dextrose 5% 250 mL infusion (16 mcg/mL) 4 mcg/min (04/28/17 0800)  . lactated ringers 10 mL/hr at 04/28/17 0800  . meropenem (MERREM) IV Stopped (04/28/17 0452)  . norepinephrine (LEVOPHED) Adult infusion 1 mcg/min (04/28/17 0800)  . dialysis replacement fluid (prismasate) 400 mL/hr at 04/28/17 0612  . dialysis replacement fluid (prismasate) 200 mL/hr at 04/26/17 1624  . dialysate (PRISMASATE) 1,700 mL/hr at 04/28/17 0616  . sodium chloride       PRN Medications:  sodium chloride, heparin, morphine injection, ondansetron (ZOFRAN) IV, oxyCODONE, sodium chloride, sodium chloride flush, sodium chloride flush   Assessment:   1. Ischemic cardiomyopathy s/p multiple PCI's with chronic systolic heart failure and EF 20% on home dobutamine preop with significant improvement in symptoms. Evaluated at [facility] for heart transplant but LVAD felt to be the best treatment for him at this time. POD8s/p HeartMate III LVAD. He is critically ill with multi-system organ failure but improving. Hemodynamics have improved markedly over the past few days correlating with improvement in inflammatory markers.   2.H/OHypertension 3.H/OHyperlipidemia 4. PAF in sinuspreopon amiodarone and Eliquis.Now back in atrial fib.Amio stopped. 5.H/OStage III CKD. Acute on chronic postop renal  failurewith anuria now on CRRT.  6. LBBB s/p CRT-D upgrade in 10/2016 without improvement in symptoms or EF. LV epicardial leads cut off at the time of surgery. 7. Prior smoking with mild obstruction and severe diffusion defect on PFT's. CXRshowsslightly more edema than yesterday.  Oxygenation is down a little from yesterday but may improve with IS and getting up out of bed.  8. Postop leukocytosis and fever. I suspect this is inflammatory occurringearly postopbut with his complicated coursehe wasstartedantibiotics.CXRmuch improved.Sputum culture grew a few achromobacter resistant to Maxipime so broadened to meropenem. WBC trending up a little but afebrile. Central line is new. Dialysis catheter placed last weekend. 9. Marked rise in bilirubin and transaminases. Likely due to pump runand right sided congestion, hypotension.stable bili, transaminases improving.  10. INR is 3.8today after one small dose of Coumadin several days ago. Hold off on further anticoagulation for now. This should start trending down as he starts eating.LDH is down to 528. 11. Passes FEES yesterday so will start full liquids and advance to dysphagia 1. 46. Wife is doing dressing change with VAD coordinator.    Plan/Discussion:    He remains fairly stable overall and gradually improving. Co-ox is up some today. I think the his PO2 is likely a major determinant of his Co-ox at this point. He has a severe diffusion capacity defect and PO2 is not going to be normal. VAD seems to be working well. Plan to continue removing volume with CRRT as tolerated to keep CVP around 10 as long as BP allows. Start diet and encourage nutrition. Would like to avoid feeding tube. OOB to chair. Continue PT.  I reviewed the LVAD parameters from today, and compared the results to the patient's prior recorded data.  No programming changes were made.  The LVAD is functioning within specified parameters.  The patient performs LVAD  self-test  daily.  LVAD interrogation was negative for any significant power changes, alarms or PI events/speed drops.  LVAD equipment check completed and is in good working order.  Back-up equipment present.   LVAD education done on emergency procedures and precautions and reviewed exit site care.  Length of Stay: [address]  Fernande Boyden [facility] 04/28/2017, 8:47 AM

## 2017-04-28 NOTE — Progress Notes (Signed)
Pt had been up in chair for 2 hours.  Respiratory rate in 30s, MAP doppled in 60s. Patient assisted back to bed by 3 RNs with moderate assistance. CRRT changed to run even at this time and levophed titrated up to obtain MAP doppled at least 80.  Will continue to monitor pt closely.

## 2017-04-28 NOTE — Progress Notes (Signed)
Patient ID: David [name], male   DOB: May 11, 1965, 52 y.o.   MRN: [id number]   Advanced Heart Failure VAD Team Note  Subjective:    David [name] is a 52 y.o. male with a PMH of ICM ('09 PCI to LAD, mRCA, '10 PCI to Lcx and '12 mRCA), HFrEF (EF ~20% for >5 years), HLD and HTN admitted 12/26 for VAD placement due to end-stage systolic HF.   Underwent successful HM-3 LVAD placement 04/20/17.  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   -Extubated 12/28.  -Developed AKI. CVVHD started 12/30 -ReIntubated 04/23/17  -Developed progressive shock with uptitration of norepinephrine and epinephrine and addition of vasopressin.  Milrinone stopped and dobutamine begun.  CXR with CHF.   -Re-extubated 04/26/2017 -Started on Meropenem 04/27/16 with + Achromobacter in tracheal aspirate.   Coox 58.8% today on Dobutamine 6 and Norepi at 1.   CVVHD ran negative yesterday at goal of 50-100 cc off an hour.  CVP 12 this am.   MAPs dropped to 50s last night so CVVHD ran even overnight and started back on low dose levophed. Improved this am and levophed only at 1.  Feeling OK. Denies SOB. Frustrated to still be NPO.  Swallow study yesterday recommended Dysphagia 1 diet  LDH 407 -> 576 -> 923 -> 967 -> 704 -> 612 -> 528 -> 492 INR 1.34 -> 1.52 -> 1.73 -> 1.95 -> 2.06 -> 2.32 -> 3.03 -> 3.80 (No coumadin since 04/22/17)  WBC 21.4 today. Started on Meropenem 04/27/16 with + Achromobacter in tracheal aspirate. Discussed with personally with Pharmacy. CXR this am pending.   LFTs elevated with tbili 9.6 -> 10.1 -> 10.7. AST/ALT slowly improving. Jaundiced.   Echo 12/31 reviewed: No significant pericardial effusion, IV septum midline to slightly to the right, RV function adequate.   LVAD INTERROGATION:  HeartMate III LVAD:  Flow 4.6 liters/min, speed 5300, power 4.0, PI 4.6. No PI events since am of 04/26/17.  Objective:    Vital Signs:   Temp:  [97.5 F (36.4 C)-98.4 F (36.9 C)] 97.5 F (36.4 C)  (01/04 0400) Pulse Rate:  [25-106] 27 (01/04 0645) Resp:  [9-40] 26 (01/04 0700) BP: (76-130)/(40-98) 93/73 (01/04 0700) SpO2:  [61 %-100 %] 94 % (01/04 0645) Arterial Line BP: (81-110)/(69-94) 98/83 (01/03 1130) Weight:  [160 lb 4.4 oz (72.7 kg)] 160 lb 4.4 oz (72.7 kg) (01/04 0500) Last BM Date: 04/27/17 Mean arterial Pressure 80 this am.   Intake/Output:   Intake/Output Summary (Last 24 hours) at 04/28/2017 0718 Last data filed at 04/28/2017 0700 Gross per 24 hour  Intake 1235.35 ml  Output 2397 ml  Net -1161.65 ml     Physical Exam   GENERAL: Fatigued. NAD.  HEENT: Atraumatic. +Jaundice with scleral icterus.  NECK: Supple, JVP to jaw cm. Carotids OK.  CARDIAC:  Mechanical heart sounds with LVAD hum present.  LUNGS:  CTAB, normal effort.  ABDOMEN:  NT, + distended, no HSM. No bruits or masses. +hypoactve BS  LVAD exit site: Dressing dry and intact. No erythema or drainage. Stabilization device present and accurately applied. Driveline dressing changed daily per sterile technique. EXTREMITIES:  Warm and dry. No cyanosis, clubbing, rash, or edema.  NEUROLOGIC:  Alert & oriented x 3. Cranial nerves grossly intact. Moves all 4 extremities w/o difficulty. Affect pleasant      Telemetry   AFib 100-110s, personally reviewed.  Labs   Basic Metabolic Panel: Recent Labs  Lab 04/24/17 0446  04/25/17 0359  04/26/17 0343 04/26/17  1638 04/27/17 0349 04/27/17 1610 04/28/17 0400  NA 132*   < > 133*   < > 133* 134* 136 137 136  K 4.4   < > 4.5   < > 4.3 4.3 4.0 4.1 4.0  CL 99*   < > 101   < > 102 103 102 102 103  CO2 25   < > 26   < > 24 24 24 26 26   GLUCOSE 119*   < > 123*   < > 99 89 80 101* 96  BUN 23*   < > 19   < > 15 15 16 14 13   CREATININE 2.41*   < > 1.99*   < > 1.88* 1.74* 1.64* 1.70* 1.69*  CALCIUM 7.4*   < > 7.6*   < > 7.9* 7.9* 8.1* 8.1* 8.0*  MG 2.3  --  2.5*  --  2.7*  --  2.6*  --  2.6*  PHOS 3.9  3.9   < > 3.5   < > 3.1 2.4* 2.2* 3.0 2.3*   < > = values in  this interval not displayed.    Liver Function Tests: Recent Labs  Lab 04/24/17 0816  04/25/17 0359  04/26/17 0343 04/26/17 1638 04/27/17 0349 04/27/17 1050 04/27/17 1610 04/28/17 0400  AST 256*  --  223*  --  201*  --  149*  --   --  119*  ALT 212*  --  201*  --  187*  --  144*  --   --  112*  ALKPHOS 128*  --  152*  --  177*  --  191*  --   --  209*  BILITOT 10.4*  --  9.8*  --  10.1*  --  10.7* 10.8*  --  10.7*  PROT 6.0*  --  6.0*  --  6.4*  --  6.5  --   --  6.5  ALBUMIN 1.9*   < > 1.8*   < > 1.8* 1.9* 1.9*  --  1.9* 1.8*   < > = values in this interval not displayed.   No results for input(s): LIPASE, AMYLASE in the last 168 hours. No results for input(s): AMMONIA in the last 168 hours.  CBC: Recent Labs  Lab 04/23/17 0336  04/24/17 0446 04/25/17 0359 04/26/17 0343 04/27/17 0349 04/28/17 0400  WBC 34.7*  --  19.9* 14.3* 13.5* 19.0* 21.4*  NEUTROABS 31.9*  --  17.5* 11.8* 11.2* 16.6*  --   HGB 8.8*   < > 9.2* 8.1* 8.9* 9.2* 8.9*  HCT 26.4*   < > 28.1* 25.1* 26.9* 28.7* 27.7*  MCV 77.0*  --  77.4* 77.5* 78.4 77.2* 77.8*  PLT 143*  --  127* 118* 114* 133* 181   < > = values in this interval not displayed.    INR: Recent Labs  Lab 04/24/17 0446 04/25/17 0359 04/26/17 0343 04/27/17 0349 04/28/17 0400  INR 1.95 2.06 2.32 3.03 3.80    Other results:  Imaging   Dg Chest Port 1 View  Result Date: 04/27/2017 CLINICAL DATA:  Central line placement. EXAM: PORTABLE CHEST 1 VIEW COMPARISON:  April 27, 2017 FINDINGS: A new left central line has been placed. The distal tip appears to be in the central SVC. The right IJ sheath and right subclavian line are stable. No pneumothorax. The LVAD device is stable as is the pacemaker. Interstitial opacities in the lungs, probably edema, are unchanged. No other interval changes. Stable cardiomegaly. IMPRESSION: The new  left central line is in good position with no pneumothorax. Other support apparatus is stable. Persistent  edema and cardiomegaly. Electronically Signed   By: Dorise Bullion III M.D   On: 04/27/2017 13:28   Dg Chest Port 1 View  Result Date: 04/27/2017 CLINICAL DATA:  Left ventricular assist device. EXAM: PORTABLE CHEST 1 VIEW COMPARISON:  04/26/2017, 04/24/2017, 03/10/2017, 08/02/2016. FINDINGS: Interim extubation removal of NG tube. Right IJ line and right IJ sheath in stable position. Cardiac pacer and left ventricular assist device in stable position. Prior CABG. Stable cardiomegaly. Persistent mild bilateral interstitial prominence bilateral pleural effusions suggesting CHF. Slight worsening from prior exam. No pneumothorax. IMPRESSION: 1. Interim removal of endotracheal tube and NG tube. Remaining lines and tubes stable position. Cardiac pacer left ventricular assist device in stable position. 2. Prior CABG. Cardiomegaly with persistent mild bilateral interstitial prominence and small pleural effusions suggesting mild CHF. Slight worsening from prior exam. Electronically Signed   By: Marcello Moores  Register   On: 04/27/2017 07:25     Medications:     Scheduled Medications: . aspirin  325 mg Per Tube Daily  . chlorhexidine  15 mL Mouth Rinse BID  . Chlorhexidine Gluconate Cloth  6 each Topical Daily  . levalbuterol  1.25 mg Nebulization Q6H  . mouth rinse  15 mL Mouth Rinse q12n4p  . pantoprazole (PROTONIX) IV  40 mg Intravenous Q24H  . sodium chloride flush  10-40 mL Intracatheter Q12H  . sodium chloride flush  3 mL Intravenous Q12H  . Warfarin - Physician Dosing Inpatient   Does not apply q1800    Infusions: . sodium chloride Stopped (04/24/17 1701)  . sodium chloride Stopped (04/24/17 1701)  . DOBUTamine 6 mcg/kg/min (04/27/17 1552)  . EPINEPHrine 4 mg in dextrose 5% 250 mL infusion (16 mcg/mL) 4 mcg/min (04/27/17 1552)  . lactated ringers 10 mL/hr at 04/26/17 0800  . meropenem (MERREM) IV Stopped (04/28/17 0452)  . norepinephrine (LEVOPHED) Adult infusion 1 mcg/min (04/28/17 0600)  .  dialysis replacement fluid (prismasate) 400 mL/hr at 04/28/17 0612  . dialysis replacement fluid (prismasate) 200 mL/hr at 04/26/17 1624  . dialysate (PRISMASATE) 1,700 mL/hr at 04/28/17 0616  . sodium chloride      PRN Medications: sodium chloride, heparin, morphine injection, ondansetron (ZOFRAN) IV, oxyCODONE, sodium chloride, sodium chloride flush, sodium chloride flush   Patient Profile   Sagan Maselli is a 52 y.o. male with a PMH of ICM ('09 PCI to LAD, mRCA, '10 PCI to Lcx and '12 mRCA), HFrEF (EF ~20% for >5 years) s/p HM-III VAD placement on 12/27 due to end-stage systolic HF.   Assessment/Plan:    1. Acute on chronic systolic HF: Echo 95/62/13 with LVEF 20-25%, Mild MR, Mild/Mod MR, Severe LAE, Mildly reduced RV, PA peak pressure 56 mm Hg.  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 Extubated, initially stopped epinephrine/norepinephrine but restarted with fall in UOP and MAP.  With progressive renal failure, pulmonary edema, and hypotension, he was re-intubated on 12/30.   CVVH was begun.  12/31 was difficult day with refractory shock (MAP in 60s).  Reviewed 12/31 echo which showed no significant pericardial effusion, adequate RV function, and IV septum near mid-line. Given suspected for vasodilatory/septic shock with PCT 31 04/24/17, he is covered with cefepime.   - CO 58.8% this am on Dobutamine 6 and Levophed at 1.   - CVP 12-13  - CVVH running pulling 50cc right now.  - He is on ASA 325 currently,  has not been getting warfarin (INR rose with elevated LFTs).  - Warfarin goal INR 2-2.5. INR up to 3.8 and last coumadin dose was 12/29.  No bleeding. - Swan pulled 04/25/17.  2. CAD s/p PCI to mid RCA and PLOM with DES x 2 and DES to mid LAD in 1/18.   - Continue ASA and statin. No change.   3. PAF: Suspect atrial fibrillation but rate is controlled. He is on po amiodarone but do not think this caused rise in LFTs.  No change.  4. ARF on CKD stage  III: Suspect some degree of intrinsic renal disease prior to LVAD placement (baseline creatinine around 1.5) with probable ATN from intra-op/peri-op hypotension and development of vasodilatory/septic shock.   - Remains on CVVHD. Negative 1.1 L yesterday. Minimal UOP in bladder scan.  - Weight unchanged CVP.  5. ABLA, expected:   - Hgb 8.9 this am s/p 1 u PRBCs 12/30 and 04/25/17.  6. ID: Started on cefepime empirically with septic/vasodilatory shock, PCT was 31 04/25/17 Cultures (blood, sputum) NGTD   - WBCs up to 21.4 this am.  - ABX broadened to Meropenem 04/27/16 with + Achromobacter on tracheal aspirate.  - PCT 8.31 04/27/17. (Trending down) 7. Elevated LFTs: - Tbili remains up at 10.7 this am. Suspect at least partial cholestatic picture with direct > indirect  Length of Stay: [address]  Annamaria Helling 04/28/2017, 7:18 AM  VAD Team --- VAD ISSUES ONLY--- Pager [phone] ([place])  Advanced Heart Failure Team  Pager [phone] (M-F; 7a - 4p)  Please contact [facility] for night-coverage after hours (4p -7a ) and weekends on [email or web address]  Agree with above.  He remains critically ill with MSOF s/p VAD placement. Tracheal cx back yesterday and now switched to meropenem . Was hypotensive last night and low-dose NE added to dobutamine and epi. Co-ox now improving. He remains anuric. CVP 12 on CVVHD. T bili stable at 10.7 - hopefully starting to turn the corner  On exam Weak and ill appearing RIJ trialysis + scleral icterus LVHD hum Ab distended hypoactive BS Extremities warm  He remains critically ill with MSOF likely due to septic shock post-VAD placement.  Co-ox improved on current regimen. Will continue. Continue meropenem. WBC still going up. If not improving consider adding fungal coverage. VAD interrogated personally. Parameters stable. INR remains high due to liver failure (no warfarin). Continue SCDs. Needs nutrition. Will d/w TCTS.   CRITICAL CARE Performed by: Glori Bickers  Total critical care time: 35 minutes  Critical care time was exclusive of separately billable procedures and treating other patients.  Critical care was necessary to treat or prevent imminent or life-threatening deterioration.  Critical care was time spent personally by me (independent of midlevel providers or residents) on the following activities: development of treatment plan with patient and/or surrogate as well as nursing, discussions with consultants, evaluation of patient's response to treatment, examination of patient, obtaining history from patient or surrogate, ordering and performing treatments and interventions, ordering and review of laboratory studies, ordering and review of radiographic studies, pulse oximetry and re-evaluation of patient's condition.  Glori Bickers, MD  8:57 AM

## 2017-04-29 DIAGNOSIS — K7200 Acute and subacute hepatic failure without coma: Secondary | ICD-10-CM

## 2017-04-29 DIAGNOSIS — R570 Cardiogenic shock: Secondary | ICD-10-CM

## 2017-04-29 LAB — RENAL FUNCTION PANEL
Albumin: 1.8 g/dL — ABNORMAL LOW (ref 3.5–5.0)
Albumin: 1.8 g/dL — ABNORMAL LOW (ref 3.5–5.0)
Anion gap: 8 (ref 5–15)
Anion gap: 7 (ref 5–15)
BUN: 13 mg/dL (ref 6–20)
BUN: 10 mg/dL (ref 6–20)
CALCIUM: 7.8 mg/dL — AB (ref 8.9–10.3)
CALCIUM: 7.6 mg/dL — AB (ref 8.9–10.3)
CHLORIDE: 100 mmol/L — AB (ref 101–111)
CO2: 27 mmol/L (ref 22–32)
CO2: 25 mmol/L (ref 22–32)
CREATININE: 1.68 mg/dL — AB (ref 0.61–1.24)
CREATININE: 1.65 mg/dL — AB (ref 0.61–1.24)
Chloride: 97 mmol/L — ABNORMAL LOW (ref 101–111)
GFR calc Af Amer: 52 mL/min — ABNORMAL LOW (ref >60)
GFR calc Af Amer: 53 mL/min — ABNORMAL LOW (ref >60)
GFR calc non Af Amer: 46 mL/min — ABNORMAL LOW (ref >60)
GFR, EST NON AFRICAN AMERICAN: 45 mL/min — AB (ref >60)
Glucose, Bld: 100 mg/dL — ABNORMAL HIGH (ref 65–99)
Glucose, Bld: 98 mg/dL (ref 65–99)
Phosphorus: 2.8 mg/dL (ref 2.5–4.6)
Phosphorus: 2.4 mg/dL — ABNORMAL LOW (ref 2.5–4.6)
Potassium: 4.1 mmol/L (ref 3.5–5.1)
Potassium: 4.3 mmol/L (ref 3.5–5.1)
SODIUM: 135 mmol/L (ref 135–145)
SODIUM: 129 mmol/L — AB (ref 135–145)

## 2017-04-29 LAB — COMPREHENSIVE METABOLIC PANEL
ALT: 92 U/L — AB (ref 17–63)
AST: 99 U/L — ABNORMAL HIGH (ref 15–41)
Albumin: 1.8 g/dL — ABNORMAL LOW (ref 3.5–5.0)
Alkaline Phosphatase: 253 U/L — ABNORMAL HIGH (ref 38–126)
Anion gap: 8 (ref 5–15)
BUN: 12 mg/dL (ref 6–20)
CALCIUM: 8.3 mg/dL — AB (ref 8.9–10.3)
CHLORIDE: 101 mmol/L (ref 101–111)
CO2: 26 mmol/L (ref 22–32)
CREATININE: 1.72 mg/dL — AB (ref 0.61–1.24)
GFR, EST AFRICAN AMERICAN: 51 mL/min — AB (ref >60)
GFR, EST NON AFRICAN AMERICAN: 44 mL/min — AB (ref >60)
Glucose, Bld: 101 mg/dL — ABNORMAL HIGH (ref 65–99)
Potassium: 4 mmol/L (ref 3.5–5.1)
Sodium: 135 mmol/L (ref 135–145)
Total Bilirubin: 9.2 mg/dL — ABNORMAL HIGH (ref 0.3–1.2)
Total Protein: 6.5 g/dL (ref 6.5–8.1)

## 2017-04-29 LAB — PROTIME-INR
INR: 3.16
Prothrombin Time: 32.2 seconds — ABNORMAL HIGH (ref 11.4–15.2)

## 2017-04-29 LAB — COOXEMETRY PANEL
Carboxyhemoglobin: 2 % — ABNORMAL HIGH (ref 0.5–1.5)
Carboxyhemoglobin: 2.3 % — ABNORMAL HIGH (ref 0.5–1.5)
Carboxyhemoglobin: 1.7 % — ABNORMAL HIGH (ref 0.5–1.5)
METHEMOGLOBIN: 1.1 % (ref 0.0–1.5)
METHEMOGLOBIN: 1.3 % (ref 0.0–1.5)
Methemoglobin: 0.8 % (ref 0.0–1.5)
O2 SAT: 47.1 %
O2 Saturation: 65.8 %
O2 Saturation: 57 %
Total hemoglobin: 8.9 g/dL — ABNORMAL LOW (ref 12.0–16.0)
Total hemoglobin: 8.7 g/dL — ABNORMAL LOW (ref 12.0–16.0)
Total hemoglobin: 9.2 g/dL — ABNORMAL LOW (ref 12.0–16.0)

## 2017-04-29 LAB — PROCALCITONIN: PROCALCITONIN: 3.87 ng/mL

## 2017-04-29 LAB — LACTATE DEHYDROGENASE: LDH: 467 U/L — ABNORMAL HIGH (ref 98–192)

## 2017-04-29 LAB — MAGNESIUM: MAGNESIUM: 2.3 mg/dL (ref 1.7–2.4)

## 2017-04-29 MED ORDER — LEVALBUTEROL HCL 1.25 MG/0.5ML IN NEBU
1.2500 mg | INHALATION_SOLUTION | Freq: Three times a day (TID) | RESPIRATORY_TRACT | Status: DC
  Administered 2017-04-29 – 2017-05-01 (×7): 1.25 mg via RESPIRATORY_TRACT
  Filled 2017-04-29 (×7): qty 0.5

## 2017-04-29 MED ORDER — HEPARIN SODIUM (PORCINE) 5000 UNIT/ML IJ SOLN
5000.0000 [IU] | Freq: Three times a day (TID) | INTRAMUSCULAR | Status: DC
  Administered 2017-04-29 – 2017-05-03 (×12): 5000 [IU] via SUBCUTANEOUS
  Filled 2017-04-29 (×12): qty 1

## 2017-04-29 NOTE — Progress Notes (Signed)
Patient ID: David [name], male   DOB: 02/09/1966, 52 y.o.   MRN: [id number]   Advanced Heart Failure VAD Team Note  Subjective:    David [name] is a 52 y.o. male with a PMH of ICM ('09 PCI to LAD, mRCA, '10 PCI to Lcx and '12 mRCA), HFrEF (EF ~20% for >5 years), HLD and HTN admitted 12/26 for VAD placement due to end-stage systolic HF.   Underwent successful HM-3 LVAD placement 04/20/17.  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   -Extubated 12/28.  -Developed AKI. CVVHD started 12/30 -ReIntubated 04/23/17  -Developed progressive shock with uptitration of norepinephrine and epinephrine and addition of vasopressin.  Milrinone stopped and dobutamine begun.  CXR with CHF.   -Re-extubated 04/26/2017 -Started on Meropenem 04/27/16 with + Achromobacter in tracheal aspirate.   Remains on dobutamine 6, epi 4, NE 3. On CVVHD. Anuric. Clotted CVVHD filter 2x overnight. Confused at times. Breathing better. Co-ox 66%.  Remains on Meropenem 04/27/16 with + Achromobacter in tracheal aspirate  LDH 407 -> 576 -> 923 -> 967 -> 704 -> 612 -> 528 -> 492 -> 467 INR 1.34 -> 1.52 -> 1.73 -> 1.95 -> 2.06 -> 2.32 -> 3.03 -> 3.80 (No coumadin since 04/22/17)-> 3.16   LFTs elevated with tbili 9.6 -> 10.1 -> 10.7 -> 9.2   Echo 12/31 reviewed: No significant pericardial effusion, IV septum midline to slightly to the right, RV function adequate.   LVAD INTERROGATION:  HeartMate III LVAD:  Flow 4.3 liters/min, speed 5300, power 5.0, PI 3.9.   Objective:    Vital Signs:   Temp:  [97.6 F (36.4 C)-98.2 F (36.8 C)] 97.6 F (36.4 C) (01/05 0830) Pulse Rate:  [28-109] 106 (01/05 0900) Resp:  [17-38] 31 (01/05 0900) BP: (77-114)/(59-95) 109/90 (01/05 0900) SpO2:  [88 %-100 %] 95 % (01/05 0900) Weight:  [70.3 kg (154 lb 15.7 oz)] 70.3 kg (154 lb 15.7 oz) (01/05 0433) Last BM Date: 04/27/17 Mean arterial Pressure 80s  Intake/Output:   Intake/Output Summary (Last 24 hours) at 04/29/2017 0936 Last  data filed at 04/29/2017 0900 Gross per 24 hour  Intake 2863.97 ml  Output 3379 ml  Net -515.03 ml     Physical Exam   General:  Weak ill-appearing Jaundiced HEENT: normal anicteric edentulous Neck: supple. JVP to ear  Carotids 2+ bilat; no bruits. No lymphadenopathy or thryomegaly appreciated. Cor: LVAD hum.  Sternal wound ok Lungs: Clear. Decreased at bases Abdomen: soft, nontender, non-distended. No hepatosplenomegaly. No bruits or masses. Good bowel sounds. Driveline site clean. Anchor in place.  Extremities: no cyanosis, clubbing, rash. Warm no edema  +SCDs Neuro: alert & oriented x 3. No focal deficits. Moves all 4 without problem    Telemetry   NSR 80s, personally reviewed.  Labs   Basic Metabolic Panel: Recent Labs  Lab 04/25/17 0359  04/26/17 0343  04/27/17 0349 04/27/17 1610 04/28/17 0400 04/28/17 1630 04/29/17 0353  NA 133*   < > 133*   < > 136 137 136 135 135  135  K 4.5   < > 4.3   < > 4.0 4.1 4.0 4.0 4.1  4.0  CL 101   < > 102   < > 102 102 103 101 100*  101  CO2 26   < > 24   < > 24 26 26 26 27  26   GLUCOSE 123*   < > 99   < > 80 101* 96 124* 100*  101*  BUN 19   < >  15   < > 16 14 13 12 13  12   CREATININE 1.99*   < > 1.88*   < > 1.64* 1.70* 1.69* 1.61* 1.68*  1.72*  CALCIUM 7.6*   < > 7.9*   < > 8.1* 8.1* 8.0* 8.0* 7.8*  8.3*  MG 2.5*  --  2.7*  --  2.6*  --  2.6*  --  2.3  PHOS 3.5   < > 3.1   < > 2.2* 3.0 2.3* 1.9* 2.8   < > = values in this interval not displayed.    Liver Function Tests: Recent Labs  Lab 04/25/17 0359  04/26/17 0343  04/27/17 0349 04/27/17 1050 04/27/17 1610 04/28/17 0400 04/28/17 1630 04/29/17 0353  AST 223*  --  201*  --  149*  --   --  119*  --  99*  ALT 201*  --  187*  --  144*  --   --  112*  --  92*  ALKPHOS 152*  --  177*  --  191*  --   --  209*  --  253*  BILITOT 9.8*  --  10.1*  --  10.7* 10.8*  --  10.7*  --  9.2*  PROT 6.0*  --  6.4*  --  6.5  --   --  6.5  --  6.5  ALBUMIN 1.8*   < > 1.8*   < >  1.9*  --  1.9* 1.8* 1.8* 1.8*  1.8*   < > = values in this interval not displayed.   No results for input(s): LIPASE, AMYLASE in the last 168 hours. No results for input(s): AMMONIA in the last 168 hours.  CBC: Recent Labs  Lab 04/23/17 0336  04/24/17 0446 04/25/17 0359 04/26/17 0343 04/27/17 0349 04/28/17 0400  WBC 34.7*  --  19.9* 14.3* 13.5* 19.0* 21.4*  NEUTROABS 31.9*  --  17.5* 11.8* 11.2* 16.6*  --   HGB 8.8*   < > 9.2* 8.1* 8.9* 9.2* 8.9*  HCT 26.4*   < > 28.1* 25.1* 26.9* 28.7* 27.7*  MCV 77.0*  --  77.4* 77.5* 78.4 77.2* 77.8*  PLT 143*  --  127* 118* 114* 133* 181   < > = values in this interval not displayed.    INR: Recent Labs  Lab 04/25/17 0359 04/26/17 0343 04/27/17 0349 04/28/17 0400 04/29/17 0353  INR 2.06 2.32 3.03 3.80 3.16    Other results:  Imaging   Dg Chest Port 1 View  Result Date: 04/28/2017 CLINICAL DATA:  Left ventricular assistance device. EXAM: PORTABLE CHEST 1 VIEW COMPARISON:  Radiograph of April 27, 2017. FINDINGS: Stable cardiomegaly. Left ventricular assistance device is unchanged in position. Left-sided pacemaker is unchanged in position. Right subclavian catheter is unchanged. Left internal jugular catheter is unchanged. No pneumothorax is noted. Stable bibasilar atelectasis or edema is noted. Bony thorax is unremarkable. IMPRESSION: Stable position of left ventricular assistance device. Stable support apparatus. Stable bibasilar atelectasis or edema. Electronically Signed   By: Marijo Conception, M.D.   On: 04/28/2017 07:47   Dg Chest Port 1 View  Result Date: 04/27/2017 CLINICAL DATA:  Central line placement. EXAM: PORTABLE CHEST 1 VIEW COMPARISON:  April 27, 2017 FINDINGS: A new left central line has been placed. The distal tip appears to be in the central SVC. The right IJ sheath and right subclavian line are stable. No pneumothorax. The LVAD device is stable as is the pacemaker. Interstitial opacities in the lungs, probably edema,  are unchanged. No other interval changes. Stable cardiomegaly. IMPRESSION: The new left central line is in good position with no pneumothorax. Other support apparatus is stable. Persistent edema and cardiomegaly. Electronically Signed   By: Dorise Bullion III M.D   On: 04/27/2017 13:28     Medications:     Scheduled Medications: . aspirin  325 mg Oral Daily  . chlorhexidine  15 mL Mouth Rinse BID  . Chlorhexidine Gluconate Cloth  6 each Topical Daily  . feeding supplement (ENSURE ENLIVE)  237 mL Oral TID BM  . levalbuterol  1.25 mg Nebulization TID  . mouth rinse  15 mL Mouth Rinse q12n4p  . pantoprazole  40 mg Oral Daily  . Warfarin - Physician Dosing Inpatient   Does not apply q1800    Infusions: . sodium chloride 10 mL/hr at 04/29/17 0800  . DOBUTamine 6 mcg/kg/min (04/29/17 0800)  . EPINEPHrine 4 mg in dextrose 5% 250 mL infusion (16 mcg/mL) 4 mcg/min (04/29/17 0800)  . meropenem (MERREM) IV Stopped (04/29/17 0505)  . norepinephrine (LEVOPHED) Adult infusion 3 mcg/min (04/29/17 0800)  . dialysis replacement fluid (prismasate) 400 mL/hr at 04/29/17 0820  . dialysis replacement fluid (prismasate) 200 mL/hr at 04/28/17 1849  . dialysate (PRISMASATE) 1,700 mL/hr at 04/29/17 0808  . sodium chloride      PRN Medications: heparin, morphine injection, ondansetron (ZOFRAN) IV, oxyCODONE, sodium chloride, sodium chloride flush, sodium chloride flush   Patient Profile   David [name] is a 52 y.o. male with a PMH of ICM ('09 PCI to LAD, mRCA, '10 PCI to Lcx and '12 mRCA), HFrEF (EF ~20% for >5 years) s/p HM-III VAD placement on 12/27 due to end-stage systolic HF.   Assessment/Plan:    1. Acute on chronic systolic HF: Echo 56/38/75 with LVEF 20-25%, Mild MR, Mild/Mod MR, Severe LAE, Mildly reduced RV, PA peak pressure 56 mm Hg.  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 Extubated, initially stopped epinephrine/norepinephrine but restarted with fall  in UOP and MAP.  With progressive renal failure, pulmonary edema, and hypotension, he was re-intubated on 12/30.   CVVH was begun.  12/31 was difficult day with refractory shock (MAP in 60s).  Reviewed 12/31 echo which showed no significant pericardial effusion, adequate RV function, and IV septum near mid-line. Given suspected for vasodilatory/septic shock with PCT 31 04/24/17, he is covered with cefepime.   - CO 65% this am on Dobutamine 6, EPI 4 and NE 3. Will continue to wean as BP and co-ox tolerate.   - CVP 13-14 - CVVH running pulling 50cc right now.  - He is on ASA 325 currently, has not been getting warfarin (INR rose with elevated LFTs). He has clotted 2 filters. Will discuss low dose heparin with TCTS - Warfarin goal INR 2-2.5. INR starting to come down (was up due to shock liver). INR today 3.16  Last coumadin dose was 12/29.  No bleeding. Can likely start coumadin today +/- low dose heparin. Will d/w TCTS 2. CAD s/p PCI to mid RCA and PLOM with DES x 2 and DES to mid LAD in 1/18.   - No s/s/ ischemia - Continue ASA and statin. No change.   3. PAF: -Back in NSR  4. ARF on CKD stage III: Suspect some degree of intrinsic renal disease prior to LVAD placement (baseline creatinine around 1.5) with probable ATN from intra-op/peri-op hypotension and development of vasodilatory/septic shock.   - Remains on CVVHD. Anuric - Weight now  back to baseline   keep even to mildly negative 5. ABLA, expected:   - Hgb 8.9 this am s/p 1 u PRBCs 12/30 and 04/25/17.  6. ID: Started on cefepime empirically with septic/vasodilatory shock, PCT was 31 04/25/17 Cultures (blood, sputum) NGTD   - Improving. PCT now coming down. No CBC drawn this am. Will recheck in am - ABX broadened to Meropenem 04/27/16 with + Achromobacter on tracheal aspirate.  - PCT 8.31 04/27/17 -> 3.87  7. Elevated LFTs due to shock liver - Tbili starting to come down signaling hepatic recovery 0.7-> 9.2   CRITICAL CARE Performed by:  Glori Bickers  Total critical care time: 35 minutes  Critical care time was exclusive of separately billable procedures and treating other patients.  Critical care was necessary to treat or prevent imminent or life-threatening deterioration.  Critical care was time spent personally by me (independent of midlevel providers or residents) on the following activities: development of treatment plan with patient and/or surrogate as well as nursing, discussions with consultants, evaluation of patient's response to treatment, examination of patient, obtaining history from patient or surrogate, ordering and performing treatments and interventions, ordering and review of laboratory studies, ordering and review of radiographic studies, pulse oximetry and re-evaluation of patient's condition.  Length of Stay: [place], MD 04/29/2017, 9:36 AM  VAD Team --- VAD ISSUES ONLY--- Pager [phone] (7am - 7am)  Advanced Heart Failure Team  Pager [phone] (M-F; 7a - 4p)  Please contact [facility] for night-coverage after hours (4p -7a ) and weekends on [email or web address]

## 2017-04-29 NOTE — Progress Notes (Signed)
Dobutamine was at 33mcg  at change of shift, will continue as set

## 2017-04-29 NOTE — Progress Notes (Signed)
Admit: 04/19/2017 LOS: 10  63M with sCHF and ICM s/p HM3 LVAD 93/23 complicated by shock on pressors, respoiratory failure.  Current CRRT Prescription: Start Date: 04/23/17  Catheter: R [place] Temp HD cath BFR: 200 4K Pre Blood Pump: 400 4K DFR: 1700 4K  Replacement Rate: 200 4K Goal UF: 0-42mL hr net neg Anticoagulation: no heparin in circuit; on warfarin Clotting: twice overnight, hadn't been occurring previous, INR 3.16   S: OOB to chair yesterday for 2h Tol some diet Pulling 81mL hr net as BP permits CVP 14 this AM Remains on NE/Epi + Dobutamine Anuric Rec IV phos yesterday, P is 2.8 this AM  O: 01/04 0701 - 01/05 0700 In: 2619.2 [P.O.:1440; I.V.:775.2; IV Piggyback:404] Out: 3335   Filed Weights   04/27/17 0431 04/28/17 0500 04/29/17 0433  Weight: 72.7 kg (160 lb 4.4 oz) 72.7 kg (160 lb 4.4 oz) 70.3 kg (154 lb 15.7 oz)    Recent Labs  Lab 04/28/17 0400 04/28/17 1630 04/29/17 0353  NA 136 135 135  135  K 4.0 4.0 4.1  4.0  CL 103 101 100*  101  CO2 26 26 27  26   GLUCOSE 96 124* 100*  101*  BUN 13 12 13  12   CREATININE 1.69* 1.61* 1.68*  1.72*  CALCIUM 8.0* 8.0* 7.8*  8.3*  PHOS 2.3* 1.9* 2.8   Recent Labs  Lab 04/25/17 0359 04/26/17 0343 04/27/17 0349 04/28/17 0400  WBC 14.3* 13.5* 19.0* 21.4*  NEUTROABS 11.8* 11.2* 16.6*  --   HGB 8.1* 8.9* 9.2* 8.9*  HCT 25.1* 26.9* 28.7* 27.7*  MCV 77.5* 78.4 77.2* 77.8*  PLT 118* 114* 133* 181    Scheduled Meds: . aspirin  325 mg Oral Daily  . chlorhexidine  15 mL Mouth Rinse BID  . Chlorhexidine Gluconate Cloth  6 each Topical Daily  . feeding supplement (ENSURE ENLIVE)  237 mL Oral TID BM  . levalbuterol  1.25 mg Nebulization TID  . mouth rinse  15 mL Mouth Rinse q12n4p  . pantoprazole  40 mg Oral Daily  . sodium chloride flush  10-40 mL Intracatheter Q12H  . sodium chloride flush  3 mL Intravenous Q12H  . Warfarin - Physician Dosing Inpatient   Does not apply q1800   Continuous Infusions: .  sodium chloride Stopped (04/28/17 1701)  . DOBUTamine 6 mcg/kg/min (04/29/17 0700)  . EPINEPHrine 4 mg in dextrose 5% 250 mL infusion (16 mcg/mL) 4 mcg/min (04/29/17 0700)  . meropenem (MERREM) IV Stopped (04/29/17 0505)  . norepinephrine (LEVOPHED) Adult infusion 4 mcg/min (04/29/17 0700)  . dialysis replacement fluid (prismasate) 400 mL/hr at 04/28/17 1849  . dialysis replacement fluid (prismasate) 200 mL/hr at 04/28/17 1849  . dialysate (PRISMASATE) 1,700 mL/hr at 04/29/17 0506  . sodium chloride     PRN Meds:.heparin, morphine injection, ondansetron (ZOFRAN) IV, oxyCODONE, sodium chloride, sodium chloride flush, sodium chloride flush  ABG    Component Value Date/Time   PHART 7.477 (H) 04/27/2017 0351   PCO2ART 31.7 (L) 04/27/2017 0351   PO2ART 57.0 (L) 04/27/2017 0351   HCO3 23.6 04/27/2017 0351   TCO2 25 04/27/2017 0351   ACIDBASEDEF 1.0 04/24/2017 0926   O2SAT 65.8 04/29/2017 0428    A 1. 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 Pigment nephropathy  2. ICM s/p LVAD 12/27; on dobutamine 3. Shock on NE/Epi per AHF + TCTS 4. VDRF, now extubated 5. Hypophosphatemia, repleted 1/3; follow 6. Elevated bilirubin; elevated transaminates, improving 7.  Leukocytosis, +ancrhomobacter on trach aspirate 12/31 on meropenem; 12/31 B Cx x2 NGF 8. Anemia  P 1. Cont CRRT at current settings.   2. If clotting continues will add fixed dose heparin to CRRT 3. Not ready to transition to iHD; no evidence of GFR recovery  Pearson Grippe, MD [employer] pgr [phone]

## 2017-04-29 NOTE — Progress Notes (Addendum)
Pt MAP in the 70s, RN had increased dobutamine and levo to prior dose. Coox due now. RN will continue to monitor.   Coox 57. Increased levo to 6 mcg, keeping CRRT even. RN will continue to monitor.

## 2017-04-29 NOTE — Progress Notes (Addendum)
Pt MAP increased to 82-90.  Current gtts:  Dobutamine  6, Levo  5, Epi  4. Pt doppler MAP 86 on right arm. RN sent repeat Coox. Coox result 47. Pt asymptomatic. Dr. Haroldine Laws paged.  Dr Haroldine Laws updated on patient.   RN will continue to monitor.

## 2017-04-29 NOTE — Progress Notes (Signed)
After looking at [facility], previous MD notes with no orders for titration of dobutamine, will increase to 58mcg

## 2017-04-30 LAB — COMPREHENSIVE METABOLIC PANEL
ALBUMIN: 1.7 g/dL — AB (ref 3.5–5.0)
ALT: 88 U/L — ABNORMAL HIGH (ref 17–63)
AST: 109 U/L — AB (ref 15–41)
Alkaline Phosphatase: 397 U/L — ABNORMAL HIGH (ref 38–126)
Anion gap: 8 (ref 5–15)
BUN: 11 mg/dL (ref 6–20)
CO2: 26 mmol/L (ref 22–32)
Calcium: 7.9 mg/dL — ABNORMAL LOW (ref 8.9–10.3)
Chloride: 95 mmol/L — ABNORMAL LOW (ref 101–111)
Creatinine, Ser: 1.67 mg/dL — ABNORMAL HIGH (ref 0.61–1.24)
GFR calc Af Amer: 52 mL/min — ABNORMAL LOW (ref >60)
GFR, EST NON AFRICAN AMERICAN: 45 mL/min — AB (ref >60)
Glucose, Bld: 109 mg/dL — ABNORMAL HIGH (ref 65–99)
Potassium: 4.2 mmol/L (ref 3.5–5.1)
Sodium: 129 mmol/L — ABNORMAL LOW (ref 135–145)
Total Bilirubin: 8.5 mg/dL — ABNORMAL HIGH (ref 0.3–1.2)
Total Protein: 6.7 g/dL (ref 6.5–8.1)

## 2017-04-30 LAB — PROTIME-INR
INR: 2.39
PROTHROMBIN TIME: 25.9 s — AB (ref 11.4–15.2)

## 2017-04-30 LAB — RENAL FUNCTION PANEL
ALBUMIN: 1.8 g/dL — AB (ref 3.5–5.0)
ANION GAP: 7 (ref 5–15)
BUN: 9 mg/dL (ref 6–20)
CO2: 25 mmol/L (ref 22–32)
Calcium: 7.9 mg/dL — ABNORMAL LOW (ref 8.9–10.3)
Chloride: 98 mmol/L — ABNORMAL LOW (ref 101–111)
Creatinine, Ser: 1.76 mg/dL — ABNORMAL HIGH (ref 0.61–1.24)
GFR calc Af Amer: 49 mL/min — ABNORMAL LOW (ref >60)
GFR, EST NON AFRICAN AMERICAN: 42 mL/min — AB (ref >60)
Glucose, Bld: 104 mg/dL — ABNORMAL HIGH (ref 65–99)
PHOSPHORUS: 2.2 mg/dL — AB (ref 2.5–4.6)
POTASSIUM: 4.5 mmol/L (ref 3.5–5.1)
Sodium: 130 mmol/L — ABNORMAL LOW (ref 135–145)

## 2017-04-30 LAB — MAGNESIUM: MAGNESIUM: 2.4 mg/dL (ref 1.7–2.4)

## 2017-04-30 LAB — CBC
HCT: 26.1 % — ABNORMAL LOW (ref 39.0–52.0)
Hemoglobin: 8.5 g/dL — ABNORMAL LOW (ref 13.0–17.0)
MCH: 25.4 pg — ABNORMAL LOW (ref 26.0–34.0)
MCHC: 32.6 g/dL (ref 30.0–36.0)
MCV: 78.1 fL (ref 78.0–100.0)
Platelets: 265 10*3/uL (ref 150–400)
RBC: 3.34 MIL/uL — ABNORMAL LOW (ref 4.22–5.81)
RDW: 21.4 % — AB (ref 11.5–15.5)
WBC: 21.8 10*3/uL — AB (ref 4.0–10.5)

## 2017-04-30 LAB — COOXEMETRY PANEL
Carboxyhemoglobin: 2 % — ABNORMAL HIGH (ref 0.5–1.5)
Methemoglobin: 1.4 % (ref 0.0–1.5)
O2 Saturation: 50.7 %
Total hemoglobin: 9 g/dL — ABNORMAL LOW (ref 12.0–16.0)

## 2017-04-30 LAB — LACTATE DEHYDROGENASE: LDH: 489 U/L — AB (ref 98–192)

## 2017-04-30 MED ORDER — WARFARIN SODIUM 1 MG PO TABS
1.0000 mg | ORAL_TABLET | Freq: Once | ORAL | Status: AC
  Administered 2017-04-30: 1 mg via ORAL
  Filled 2017-04-30: qty 1

## 2017-04-30 NOTE — Progress Notes (Addendum)
Patient refusing VAD daily dressing change.  Patient states it is too painful and that it is not done daily.  RN offered pain medication to pre-medicate patient and to be gentle, but patient adamantly declined.  Patient currently alert and oriented x3.  Dr. Jeffie Pollock notified and gave verbal orders to not change dressing today and resume regular daily dressing changes tomorrow, Monday 05/01/17. [place], Ardeth Sportsman

## 2017-04-30 NOTE — Progress Notes (Signed)
Patient ID: David [name], male   DOB: [birth year], 52 y.o.   MRN: [id number]   Advanced Heart Failure VAD Team Note  Subjective:    David [name] is a 52 y.o. male with a PMH of ICM ('09 PCI to LAD, mRCA, '10 PCI to Lcx and '12 mRCA), HFrEF (EF ~20% for >5 years), HLD and HTN admitted 12/26 for VAD placement due to end-stage systolic HF.   Underwent successful HM-3 LVAD placement 04/20/17.  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   -Extubated 12/28.  -Developed AKI. CVVHD started 12/30 -ReIntubated 04/23/17  -Developed progressive shock with uptitration of norepinephrine and epinephrine and addition of vasopressin.  Milrinone stopped and dobutamine begun.  CXR with CHF.   -Re-extubated 04/26/2017 -Started on Meropenem 04/27/16 with + Achromobacter in tracheal aspirate.   Remains on dobutamine 6, epi 4, NE 5. On CVVHD pulling - 50.  Had 150cc urine out last night. Denies SOB. Feels weak.  Co-ox 51%. SQ heparin started yesterday. Remains on Meropenem 04/27/16 with + Achromobacter in tracheal aspirate  LDH 407 -> 576 -> 923 -> 967 -> 704 -> 612 -> 528 -> 492 -> 467 -> 489  INR 1.34 -> 1.52 -> 1.73 -> 1.95 -> 2.06 -> 2.32 -> 3.03 -> 3.80 (No coumadin since 04/22/17)-> 3.16-> 2.4   LFTs elevated with tbili 9.6 -> 10.1 -> 10.7 -> 9.2 -> 8.5   Echo 12/31 reviewed: No significant pericardial effusion, IV septum midline to slightly to the right, RV function adequate.   LVAD INTERROGATION:  HeartMate III LVAD:  Flow 4.4 liters/min, speed 5300, power 4.0, PI 4.1  Objective:    Vital Signs:   Temp:  [97.4 F (36.3 C)-98.6 F (37 C)] 97.9 F (36.6 C) (01/06 1200) Pulse Rate:  [29-105] 87 (01/06 1200) Resp:  [17-56] 20 (01/06 1200) BP: (91-112)/(54-98) 91/62 (01/06 1200) SpO2:  [80 %-100 %] 100 % (01/06 1200) Weight:  [71.8 kg (158 lb 4.6 oz)] 71.8 kg (158 lb 4.6 oz) (01/06 0500) Last BM Date: 04/29/17 Mean arterial Pressure 80s  Intake/Output:   Intake/Output Summary  (Last 24 hours) at 04/30/2017 1337 Last data filed at 04/30/2017 1300 Gross per 24 hour  Intake 2038.23 ml  Output 2960 ml  Net -921.77 ml     Physical Exam   General:  Sitting up in bed eating. Weak appearing. + jaundice HEENT: normal x for scleral icterus and edentulous Neck: supple. JVP 10.  Carotids 2+ bilat; no bruits. No lymphadenopathy or thryomegaly appreciated. Cor: LVAD hum.  Sternal wound ok  Lungs: Clear. Abdomen: obese soft, nontender, + distended. No hepatosplenomegaly. No bruits or masses. Good bowel sounds. Driveline site clean. Anchor in place.  Extremities: no cyanosis, clubbing, rash. Trace-1+ edema +SCDs Neuro: alert & oriented x 3. No focal deficits. Moves all 4 without problem     Telemetry   NSR 80s + PVCs, personally reviewed.  Labs   Basic Metabolic Panel: Recent Labs  Lab 04/26/17 0343  04/27/17 0349 04/27/17 1610 04/28/17 0400 04/28/17 1630 04/29/17 0353 04/29/17 2030 04/30/17 0525  NA 133*   < > 136 137 136 135 135  135 129* 129*  K 4.3   < > 4.0 4.1 4.0 4.0 4.1  4.0 4.3 4.2  CL 102   < > 102 102 103 101 100*  101 97* 95*  CO2 24   < > 24 26 26 26 27  26 25 26   GLUCOSE 99   < > 80 101* 96 124*  100*  101* 98 109*  BUN 15   < > 16 14 13 12 13  12 10 11   CREATININE 1.88*   < > 1.64* 1.70* 1.69* 1.61* 1.68*  1.72* 1.65* 1.67*  CALCIUM 7.9*   < > 8.1* 8.1* 8.0* 8.0* 7.8*  8.3* 7.6* 7.9*  MG 2.7*  --  2.6*  --  2.6*  --  2.3  --  2.4  PHOS 3.1   < > 2.2* 3.0 2.3* 1.9* 2.8 2.4*  --    < > = values in this interval not displayed.    Liver Function Tests: Recent Labs  Lab 04/26/17 0343  04/27/17 0349 04/27/17 1050  04/28/17 0400 04/28/17 1630 04/29/17 0353 04/29/17 2030 04/30/17 0525  AST 201*  --  149*  --   --  119*  --  99*  --  109*  ALT 187*  --  144*  --   --  112*  --  92*  --  88*  ALKPHOS 177*  --  191*  --   --  209*  --  253*  --  397*  BILITOT 10.1*  --  10.7* 10.8*  --  10.7*  --  9.2*  --  8.5*  PROT 6.4*  --  6.5   --   --  6.5  --  6.5  --  6.7  ALBUMIN 1.8*   < > 1.9*  --    < > 1.8* 1.8* 1.8*  1.8* 1.8* 1.7*   < > = values in this interval not displayed.   No results for input(s): LIPASE, AMYLASE in the last 168 hours. No results for input(s): AMMONIA in the last 168 hours.  CBC: Recent Labs  Lab 04/24/17 0446 04/25/17 0359 04/26/17 0343 04/27/17 0349 04/28/17 0400 04/30/17 0525  WBC 19.9* 14.3* 13.5* 19.0* 21.4* 21.8*  NEUTROABS 17.5* 11.8* 11.2* 16.6*  --   --   HGB 9.2* 8.1* 8.9* 9.2* 8.9* 8.5*  HCT 28.1* 25.1* 26.9* 28.7* 27.7* 26.1*  MCV 77.4* 77.5* 78.4 77.2* 77.8* 78.1  PLT 127* 118* 114* 133* 181 265    INR: Recent Labs  Lab 04/26/17 0343 04/27/17 0349 04/28/17 0400 04/29/17 0353 04/30/17 0525  INR 2.32 3.03 3.80 3.16 2.39    Other results:  Imaging   No results found.   Medications:     Scheduled Medications: . aspirin  325 mg Oral Daily  . chlorhexidine  15 mL Mouth Rinse BID  . Chlorhexidine Gluconate Cloth  6 each Topical Daily  . feeding supplement (ENSURE ENLIVE)  237 mL Oral TID BM  . heparin injection (subcutaneous)  5,000 Units Subcutaneous Q8H  . levalbuterol  1.25 mg Nebulization TID  . mouth rinse  15 mL Mouth Rinse q12n4p  . pantoprazole  40 mg Oral Daily  . warfarin  1 mg Oral ONCE-1800  . Warfarin - Physician Dosing Inpatient   Does not apply q1800    Infusions: . sodium chloride Stopped (04/30/17 0800)  . DOBUTamine 6 mcg/kg/min (04/30/17 0700)  . EPINEPHrine 4 mg in dextrose 5% 250 mL infusion (16 mcg/mL) 4 mcg/min (04/30/17 0700)  . meropenem (MERREM) IV Stopped (04/30/17 4132)  . norepinephrine (LEVOPHED) Adult infusion 5 mcg/min (04/30/17 1002)  . dialysis replacement fluid (prismasate) 400 mL/hr at 04/30/17 0806  . dialysis replacement fluid (prismasate) 200 mL/hr at 04/29/17 1954  . dialysate (PRISMASATE) 1,700 mL/hr at 04/30/17 1059  . sodium chloride      PRN Medications: heparin, morphine injection,  ondansetron (ZOFRAN)  IV, oxyCODONE, sodium chloride, sodium chloride flush, sodium chloride flush   Patient Profile   David [name] is a 52 y.o. male with a PMH of ICM ('09 PCI to LAD, mRCA, '10 PCI to Lcx and '12 mRCA), HFrEF (EF ~20% for >5 years) s/p HM-III VAD placement on 12/27 due to end-stage systolic HF.   Assessment/Plan:    1. Acute on chronic systolic HF: Echo 31/54/00 with LVEF 20-25%, Mild MR, Mild/Mod MR, Severe LAE, Mildly reduced RV, PA peak pressure 56 mm Hg.  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 Extubated, initially stopped epinephrine/norepinephrine but restarted with fall in UOP and MAP.  With progressive renal failure, pulmonary edema, and hypotension, he was re-intubated on 12/30.   CVVH was begun.  12/31 was difficult day with refractory shock (MAP in 60s).  Reviewed 12/31 echo which showed no significant pericardial effusion, adequate RV function, and IV septum near mid-line. Given suspected for vasodilatory/septic shock with PCT 31 04/24/17, he is covered with cefepime.   - CO remains marginal at 51% this am on Dobutamine 6, EPI 4 and NE 5. Will not wean currently. Will wait for end-organ function to improve more completely then start wen - CVP 10-11  - Continue CVVH running pulling 50cc  - He is on ASA 325 currently. SQ heparin started last night. Liver seems to be recovering finally. Discussed with TCTS and PharmD. Will start warfarin today - Warfarin goal INR 2-2.5. INR starting to come down (was up due to shock liver). INR today 2.39  Last coumadin dose was 12/29.  No bleeding. Will start coumadin tonight - LDH ok  2. CAD s/p PCI to mid RCA and PLOM with DES x 2 and DES to mid LAD in 1/18.   - No s/s ischemia - Continue ASA and statin. No change.   3. PAF: -Back in NSR  4. ARF on CKD stage III: Suspect some degree of intrinsic renal disease prior to LVAD placement (baseline creatinine around 1.5) with probable ATN from intra-op/peri-op hypotension  and development of vasodilatory/septic shock.   - Remains on CVVHD. Starting to make some urine. Will follow. Appreciate Renal in put.  - Weight at baseline.  5. ABLA, expected:   - Hgb 8.5 this am s/p 1 u PRBCs 12/30 and 04/25/17. May need transfuse again soon Will try to limit in case he needs transplant in future. 6. ID: Started on cefepime empirically with septic/vasodilatory shock, PCT was 31 04/25/17 Cultures (blood, sputum) NGTD   - Improving. PCT now coming down. WBC stable at 21k - ABX broadened to Meropenem 04/27/16 with + Achromobacter on tracheal aspirate.  - PCT 8.31 04/27/17 -> 3.87  7. Elevated LFTs due to shock liver - Tbili and INR starting to come down signaling hepatic recovery   CRITICAL CARE Performed by: Glori Bickers  Total critical care time: 40 minutes  Critical care time was exclusive of separately billable procedures and treating other patients.  Critical care was necessary to treat or prevent imminent or life-threatening deterioration.  Critical care was time spent personally by me (independent of midlevel providers or residents) on the following activities: development of treatment plan with patient and/or surrogate as well as nursing, discussions with consultants, evaluation of patient's response to treatment, examination of patient, obtaining history from patient or surrogate, ordering and performing treatments and interventions, ordering and review of laboratory studies, ordering and review of radiographic studies, pulse oximetry and re-evaluation of patient's condition.  Length of Stay: 11  Glori Bickers, MD 04/30/2017, 1:37 PM  VAD Team --- VAD ISSUES ONLY--- Pager [phone] (7am - 7am)  Advanced Heart Failure Team  Pager [phone] (M-F; 7a - 4p)  Please contact [facility] for night-coverage after hours (4p -7a ) and weekends on [email or web address]

## 2017-04-30 NOTE — Progress Notes (Signed)
ANTICOAGULATION CONSULT NOTE - Initial Consult  Pharmacy Consult for Coumadin Indication: LVAD  Allergies  Allergen Reactions  . Plavix [Clopidogrel Bisulfate] Hives    Patient Measurements: Height: 5\' 5"  (165.1 cm) Weight: 158 lb 4.6 oz (71.8 kg) IBW/kg (Calculated) : 61.5 Heparin Dosing Weight: n/a  Vital Signs: Temp: 97.9 F (36.6 C) (01/06 1200) Temp Source: Oral (01/06 1200) BP: 103/54 (01/06 1000) Pulse Rate: 55 (01/06 1100)  Labs: Recent Labs    04/28/17 0400  04/29/17 0353 04/29/17 2030 04/30/17 0525  HGB 8.9*  --   --   --  8.5*  HCT 27.7*  --   --   --  26.1*  PLT 181  --   --   --  265  LABPROT 37.1*  --  32.2*  --  25.9*  INR 3.80  --  3.16  --  2.39  CREATININE 1.69*   < > 1.68*  1.72* 1.65* 1.67*   < > = values in this interval not displayed.    Estimated Creatinine Clearance: 44.5 mL/min (A) (by C-G formula based on SCr of 1.67 mg/dL (H)).   Medical History: Past Medical History:  Diagnosis Date  . AICD (automatic cardioverter/defibrillator) present   . Anemia   . Anxiety   . CAD (coronary artery disease) 2009   AMI in 12/2007 with PCI to LAD, staged PCI to  mid/distal RCA, NSTEMI in 02/2009 with BMS to LCx  . CHF (congestive heart failure) ([place])   . Chronic kidney disease 11/03/2016   stage 3 kidney disease  . Dysrhythmia    "arrythmia problems at some point", "fatal rhythms"  . History of blood transfusion    "I was bleeding from was rectum" (04/19/2017)  . HLD (hyperlipidemia)   . HTN (hypertension)   . Ischemic cardiomyopathy    Admitted in 07/2010 with CHF exacerbation  . Myocardial infarction ([place])    "I've had 4" (04/19/2017)  . Pneumonia 2018 X 1  . Seizures ([place])    one seizure in 04/2016 during cardiac event (04/19/2017)    Assessment: 52 yo male s/p LVAD implantation 12/27.  Initiated on warfarin 12/28, then stopped after 2 doses due to shock liver/rising INR. INR back in goal range this AM.  Discussed with Dr. Haroldine Laws,  and per Dr. Cyndia Bent okay to restart Coumadin today per pharmacy dosing.  Will dose with caution.   Goal of Therapy:  INR 2-3 Monitor platelets by anticoagulation protocol: Yes   Plan:  1. Coumadin 1 mg x 1 tonight. 2. Daily INR.  [name], BCPS  Clinical Pharmacist Pager [phone]  04/30/2017 12:21 PM

## 2017-04-30 NOTE — Progress Notes (Signed)
Admit: 04/19/2017 LOS: 11  74M with sCHF and ICM s/p HM3 LVAD 23/55 complicated by shock on pressors, respoiratory failure.  Current CRRT Prescription: Start Date: 04/23/17  Catheter: R [place] Temp HD cath BFR: 200 4K Pre Blood Pump: 400 4K DFR: 1700 4K  Replacement Rate: 200 4K Goal UF: 0-61mL hr net neg Anticoagulation: no heparin in circuit; on warfarin Clotting: twice overnight, hadn't been occurring previous, INR 3.16   S: No further clotting yesterday and overnight Eating, getting OOB Made 131mL urine yesterday Pulling 37mL hr net as BP permits CVP 8 this AM Remains on NE/Epi + Dobutamine No fevers  WBC 21  O: 01/05 0701 - 01/06 0700 In: 2260.9 [P.O.:1210; I.V.:850.9; IV Piggyback:200] Out: 2641 [Urine:150]  Filed Weights   04/28/17 0500 04/29/17 0433 04/30/17 0500  Weight: 72.7 kg (160 lb 4.4 oz) 70.3 kg (154 lb 15.7 oz) 71.8 kg (158 lb 4.6 oz)    Recent Labs  Lab 04/28/17 1630 04/29/17 0353 04/29/17 2030 04/30/17 0525  NA 135 135  135 129* 129*  K 4.0 4.1  4.0 4.3 4.2  CL 101 100*  101 97* 95*  CO2 26 27  26 25 26   GLUCOSE 124* 100*  101* 98 109*  BUN 12 13  12 10 11   CREATININE 1.61* 1.68*  1.72* 1.65* 1.67*  CALCIUM 8.0* 7.8*  8.3* 7.6* 7.9*  PHOS 1.9* 2.8 2.4*  --    Recent Labs  Lab 04/25/17 0359 04/26/17 0343 04/27/17 0349 04/28/17 0400 04/30/17 0525  WBC 14.3* 13.5* 19.0* 21.4* 21.8*  NEUTROABS 11.8* 11.2* 16.6*  --   --   HGB 8.1* 8.9* 9.2* 8.9* 8.5*  HCT 25.1* 26.9* 28.7* 27.7* 26.1*  MCV 77.5* 78.4 77.2* 77.8* 78.1  PLT 118* 114* 133* 181 265    Scheduled Meds: . aspirin  325 mg Oral Daily  . chlorhexidine  15 mL Mouth Rinse BID  . Chlorhexidine Gluconate Cloth  6 each Topical Daily  . feeding supplement (ENSURE ENLIVE)  237 mL Oral TID BM  . heparin injection (subcutaneous)  5,000 Units Subcutaneous Q8H  . levalbuterol  1.25 mg Nebulization TID  . mouth rinse  15 mL Mouth Rinse q12n4p  . pantoprazole  40 mg Oral Daily   . Warfarin - Physician Dosing Inpatient   Does not apply q1800   Continuous Infusions: . sodium chloride 10 mL/hr at 04/29/17 0800  . DOBUTamine 6 mcg/kg/min (04/29/17 2127)  . EPINEPHrine 4 mg in dextrose 5% 250 mL infusion (16 mcg/mL) 4 mcg/min (04/29/17 2015)  . meropenem (MERREM) IV Stopped (04/30/17 7322)  . norepinephrine (LEVOPHED) Adult infusion 5 mcg/min (04/29/17 1700)  . dialysis replacement fluid (prismasate) 400 mL/hr at 04/30/17 0806  . dialysis replacement fluid (prismasate) 200 mL/hr at 04/29/17 1954  . dialysate (PRISMASATE) 1,700 mL/hr at 04/30/17 0806  . sodium chloride     PRN Meds:.heparin, morphine injection, ondansetron (ZOFRAN) IV, oxyCODONE, sodium chloride, sodium chloride flush, sodium chloride flush  ABG    Component Value Date/Time   PHART 7.477 (H) 04/27/2017 0351   PCO2ART 31.7 (L) 04/27/2017 0351   PO2ART 57.0 (L) 04/27/2017 0351   HCO3 23.6 04/27/2017 0351   TCO2 25 04/27/2017 0351   ACIDBASEDEF 1.0 04/24/2017 0926   O2SAT 50.7 04/30/2017 0536    A 1. 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 Pigment nephropathy  2. ICM s/p LVAD 12/27; on dobutamine 3. Shock on NE/Epi per AHF + TCTS 4. VDRF,  now extubated 5. Hypophosphatemia, repleted 1/3; monitoring, now on diet might stablized w/o further IV phos 6. Elevated bilirubin; elevated transaminates, improving 7. Leukocytosis, +ancrhomobacter on trach aspirate 12/31 on meropenem; 12/31 B Cx x2 NGF 8. Anemia  P 1. Cont CRRT at current settings.   No heparin as running well for past 24h 2. UOP is positive, follow today 3. Not ready to transition to iHD; no evidence of GFR recovery  Pearson Grippe, MD [employer] pgr [phone]

## 2017-05-01 LAB — COMPREHENSIVE METABOLIC PANEL
ALT: 74 U/L — ABNORMAL HIGH (ref 17–63)
ANION GAP: 7 (ref 5–15)
AST: 96 U/L — ABNORMAL HIGH (ref 15–41)
Albumin: 1.8 g/dL — ABNORMAL LOW (ref 3.5–5.0)
Alkaline Phosphatase: 423 U/L — ABNORMAL HIGH (ref 38–126)
BILIRUBIN TOTAL: 6.1 mg/dL — AB (ref 0.3–1.2)
BUN: 10 mg/dL (ref 6–20)
CO2: 27 mmol/L (ref 22–32)
Calcium: 7.9 mg/dL — ABNORMAL LOW (ref 8.9–10.3)
Chloride: 98 mmol/L — ABNORMAL LOW (ref 101–111)
Creatinine, Ser: 1.93 mg/dL — ABNORMAL HIGH (ref 0.61–1.24)
GFR, EST AFRICAN AMERICAN: 44 mL/min — AB (ref >60)
GFR, EST NON AFRICAN AMERICAN: 38 mL/min — AB (ref >60)
Glucose, Bld: 104 mg/dL — ABNORMAL HIGH (ref 65–99)
POTASSIUM: 4.4 mmol/L (ref 3.5–5.1)
Sodium: 132 mmol/L — ABNORMAL LOW (ref 135–145)
TOTAL PROTEIN: 6.8 g/dL (ref 6.5–8.1)

## 2017-05-01 LAB — RENAL FUNCTION PANEL
ALBUMIN: 1.7 g/dL — AB (ref 3.5–5.0)
Albumin: 1.7 g/dL — ABNORMAL LOW (ref 3.5–5.0)
Anion gap: 7 (ref 5–15)
Anion gap: 6 (ref 5–15)
BUN: 10 mg/dL (ref 6–20)
BUN: 13 mg/dL (ref 6–20)
CALCIUM: 7.9 mg/dL — AB (ref 8.9–10.3)
CALCIUM: 7.8 mg/dL — AB (ref 8.9–10.3)
CO2: 27 mmol/L (ref 22–32)
CO2: 26 mmol/L (ref 22–32)
CREATININE: 1.88 mg/dL — AB (ref 0.61–1.24)
CREATININE: 2.18 mg/dL — AB (ref 0.61–1.24)
Chloride: 98 mmol/L — ABNORMAL LOW (ref 101–111)
Chloride: 99 mmol/L — ABNORMAL LOW (ref 101–111)
GFR calc Af Amer: 45 mL/min — ABNORMAL LOW (ref >60)
GFR calc non Af Amer: 39 mL/min — ABNORMAL LOW (ref >60)
GFR, EST AFRICAN AMERICAN: 38 mL/min — AB (ref >60)
GFR, EST NON AFRICAN AMERICAN: 33 mL/min — AB (ref >60)
GLUCOSE: 102 mg/dL — AB (ref 65–99)
Glucose, Bld: 104 mg/dL — ABNORMAL HIGH (ref 65–99)
PHOSPHORUS: 2.2 mg/dL — AB (ref 2.5–4.6)
Phosphorus: 2.5 mg/dL (ref 2.5–4.6)
Potassium: 4.4 mmol/L (ref 3.5–5.1)
Potassium: 4.6 mmol/L (ref 3.5–5.1)
SODIUM: 132 mmol/L — AB (ref 135–145)
SODIUM: 131 mmol/L — AB (ref 135–145)

## 2017-05-01 LAB — COOXEMETRY PANEL
CARBOXYHEMOGLOBIN: 2.6 % — AB (ref 0.5–1.5)
Methemoglobin: 1 % (ref 0.0–1.5)
O2 Saturation: 59.3 %
Total hemoglobin: 9.2 g/dL — ABNORMAL LOW (ref 12.0–16.0)

## 2017-05-01 LAB — LACTATE DEHYDROGENASE: LDH: 441 U/L — ABNORMAL HIGH (ref 98–192)

## 2017-05-01 LAB — MAGNESIUM: Magnesium: 2.4 mg/dL (ref 1.7–2.4)

## 2017-05-01 LAB — PROTIME-INR
INR: 2.23
Prothrombin Time: 24.5 seconds — ABNORMAL HIGH (ref 11.4–15.2)

## 2017-05-01 MED ORDER — LEVALBUTEROL HCL 1.25 MG/0.5ML IN NEBU
1.2500 mg | INHALATION_SOLUTION | Freq: Four times a day (QID) | RESPIRATORY_TRACT | Status: DC | PRN
  Administered 2017-05-04: 1.25 mg via RESPIRATORY_TRACT
  Filled 2017-05-01: qty 0.5

## 2017-05-01 MED ORDER — WARFARIN SODIUM 1 MG PO TABS
1.0000 mg | ORAL_TABLET | Freq: Once | ORAL | Status: AC
  Administered 2017-05-01: 1 mg via ORAL
  Filled 2017-05-01: qty 1

## 2017-05-01 MED ORDER — ASPIRIN EC 81 MG PO TBEC
81.0000 mg | DELAYED_RELEASE_TABLET | Freq: Every day | ORAL | Status: DC
Start: 2017-05-01 — End: 2017-05-27
  Administered 2017-05-01 – 2017-05-27 (×27): 81 mg via ORAL
  Filled 2017-05-01 (×27): qty 1

## 2017-05-01 NOTE — Progress Notes (Signed)
LVAD Coordinator Rounding Note:  Admitted12/26due to decompensated heart failure.  Heartmate IIILVAD [facility] [email or web address].AV valve oversewn due to aortic insufficiency.  Vital signs: Tmax:97.9 HR:85 Doppler Pressure:82 Automatic cuff:  97/75 (84) O2 Sat:98% on 6LNC SA:630>160>109>323>557>322>025>427CWC  LVAD interrogation reveals:  Speed:5300 Flow: 4.5 Power: 4.0w PI:4.1 Alarms:none Events:no PI events over last 24 hrs Hematocrit:26 Fixed speed:5300 Low speed limit:5000  Labs:  LDH trend:205>407>576>923>967>612>528>492>467>489>441  INR trend:1.34>1.52>1.73>1.95>2.32>3.03>3.80>3.16>2.39>2.23  Anticoagulation Plan: -INR Goal:2-2.5  -ASA Dose:decreased to 81 mg today (05/01/17)  Blood Products: Intra op:2 units/FFP 04/20/17  Post op: PRBC's 1 unit 12/29 1 unit 12/30 1 unit 04/25/17  Device:[employer] BiV -Therapies:off  Arrhythmia: 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 (on amiodarone and Eliquis for this) - Afib 12/29 - 04/28/17 - NSR 1/5 - present  Nitric Oxide: -  stopped on 12/30 due to initiation of BiPap  Respiratory: - Extubated 12/28 - Reintubated 04/23/2017 - Extubated 04/26/2017  Renal:  04/23/17 - CRRT started for AKI  Infection: - 04/27/17 + Achromobacteria in tracheal aspirate; started on Meropenem  Liver: - LFT's elevated with Tbili trends 10.4>10.7>9.2>8.5>6.1   Gtts: Levophed5 mcg/min Epi98mcg/min Dobutamine 6 mcg/kg/min  Milrinoneoff 04/24/2017   Plan/Recommendations: 1. VAD coordinator to change VAD dressing today 2. Please call VAD Pager for any equipment concerns.   Zada Girt RN, VAD Coordinator 24/7 pager [phone]

## 2017-05-01 NOTE — Progress Notes (Signed)
[facility] ROUNDING NOTE   Subjective:  [name] with sCHF and ICM s/p HM3 LVAD 37/10 complicated by shock on pressors, respoiratory failure    Patient is on CRRT and appears to be tolerating fine - discussed management this morning with Dr Cyndia Bent and will not remove additional fluid today   Start Date: 04/23/17     Catheter: R [place] Temp HD cath BFR: 200 4K Pre Blood Pump: 400 4K DFR: 1700 4K  Replacement Rate: 200 4K Goal UF: 0-14mL hr net neg Anticoagulation: no heparin in circuit; on warfarin    Noted increase in wbc but no fever  Now wbc increased to 20K     Objective:  Vital signs in last 24 hours:  Temp:  [97.4 F (36.3 C)-98.4 F (36.9 C)] 97.9 F (36.6 C) (01/07 0746) Pulse Rate:  [30-106] 35 (01/07 0900) Resp:  [19-37] 24 (01/07 0900) BP: (83-112)/(54-87) 97/75 (01/07 0800) SpO2:  [77 %-100 %] 77 % (01/07 0900) Weight:  [156 lb 4.9 oz (70.9 kg)] 156 lb 4.9 oz (70.9 kg) (01/07 0500)  Weight change: -15.7 oz (-0.9 kg) Filed Weights   04/29/17 0433 04/30/17 0500 05/01/17 0500  Weight: 154 lb 15.7 oz (70.3 kg) 158 lb 4.6 oz (71.8 kg) 156 lb 4.9 oz (70.9 kg)    Intake/Output: I/O last 3 completed shifts: In: 2950.8 [P.O.:1580; I.V.:1070.8; IV Piggyback:300] Out: 5002 [Urine:200; Other:4802]   Intake/Output this shift:  Total I/O In: 172.4 [P.O.:120; I.V.:52.4] Out: 200 [Urine:50; Other:150]  CVS- RRR  Mechanical sound of LVAD  RS- CTA ABD- BS present soft non-distended EXT- no edema   Basic Metabolic Panel: Recent Labs  Lab 04/27/17 0349  04/28/17 0400 04/28/17 1630 04/29/17 0353 04/29/17 2030 04/30/17 0525 04/30/17 1617 05/01/17 0408  NA 136   < > 136 135 135  135 129* 129* 130* 132*  132*  K 4.0   < > 4.0 4.0 4.1  4.0 4.3 4.2 4.5 4.4  4.4  CL 102   < > 103 101 100*  101 97* 95* 98* 98*  98*  CO2 24   < > 26 26 27  26 25 26 25 27  27   GLUCOSE 80   < > 96 124* 100*  101* 98 109* 104* 104*  102*  BUN 16   < > 13 12 13  12  10 11 9 10  10   CREATININE 1.64*   < > 1.69* 1.61* 1.68*  1.72* 1.65* 1.67* 1.76* 1.93*  1.88*  CALCIUM 8.1*   < > 8.0* 8.0* 7.8*  8.3* 7.6* 7.9* 7.9* 7.9*  7.9*  MG 2.6*  --  2.6*  --  2.3  --  2.4  --  2.4  PHOS 2.2*   < > 2.3* 1.9* 2.8 2.4*  --  2.2* 2.2*   < > = values in this interval not displayed.    Liver Function Tests: Recent Labs  Lab 04/27/17 0349 04/27/17 1050  04/28/17 0400  04/29/17 0353 04/29/17 2030 04/30/17 0525 04/30/17 1617 05/01/17 0408  AST 149*  --   --  119*  --  99*  --  109*  --  96*  ALT 144*  --   --  112*  --  92*  --  88*  --  74*  ALKPHOS 191*  --   --  209*  --  253*  --  397*  --  423*  BILITOT 10.7* 10.8*  --  10.7*  --  9.2*  --  8.5*  --  6.1*  PROT 6.5  --   --  6.5  --  6.5  --  6.7  --  6.8  ALBUMIN 1.9*  --    < > 1.8*   < > 1.8*  1.8* 1.8* 1.7* 1.8* 1.8*  1.7*   < > = values in this interval not displayed.   No results for input(s): LIPASE, AMYLASE in the last 168 hours. No results for input(s): AMMONIA in the last 168 hours.  CBC: Recent Labs  Lab 04/25/17 0359 04/26/17 0343 04/27/17 0349 04/28/17 0400 04/30/17 0525  WBC 14.3* 13.5* 19.0* 21.4* 21.8*  NEUTROABS 11.8* 11.2* 16.6*  --   --   HGB 8.1* 8.9* 9.2* 8.9* 8.5*  HCT 25.1* 26.9* 28.7* 27.7* 26.1*  MCV 77.5* 78.4 77.2* 77.8* 78.1  PLT 118* 114* 133* 181 265    Cardiac Enzymes: No results for input(s): CKTOTAL, CKMB, CKMBINDEX, TROPONINI in the last 168 hours.  BNP: Invalid input(s): POCBNP  CBG: Recent Labs  Lab 04/26/17 1643 04/26/17 1951 04/26/17 2349 04/27/17 0406 04/27/17 0754  GLUCAP 78 82 104* 35 75    Microbiology: Results for orders placed or performed during the hospital encounter of 04/19/17  Surgical pcr screen     Status: None   Collection Time: 04/19/17  1:58 PM  Result Value Ref Range Status   MRSA, PCR NEGATIVE NEGATIVE Final   Staphylococcus aureus NEGATIVE NEGATIVE Final    Comment: (NOTE) The Xpert SA Assay (FDA approved for  NASAL specimens in patients 62 years of age and older), is one component of a comprehensive surveillance program. It is not intended to diagnose infection nor to guide or monitor treatment.   Culture, blood (routine x 2)     Status: None   Collection Time: 04/22/17 11:25 AM  Result Value Ref Range Status   Specimen Description BLOOD RIGHT ARM  Final   Special Requests IN PEDIATRIC BOTTLE Blood Culture adequate volume  Final   Culture NO GROWTH 5 DAYS  Final   Report Status 04/27/2017 FINAL  Final  Culture, blood (routine x 2)     Status: None   Collection Time: 04/22/17 11:36 AM  Result Value Ref Range Status   Specimen Description BLOOD RIGHT HAND  Final   Special Requests IN PEDIATRIC BOTTLE Blood Culture adequate volume  Final   Culture NO GROWTH 5 DAYS  Final   Report Status 04/27/2017 FINAL  Final  Culture, respiratory (NON-Expectorated)     Status: None   Collection Time: 04/24/17  8:16 AM  Result Value Ref Range Status   Specimen Description TRACHEAL ASPIRATE  Final   Special Requests NONE  Final   Gram Stain   Final    MODERATE WBC PRESENT,BOTH PMN AND MONONUCLEAR NO ORGANISMS SEEN    Culture FEW ACHROMOBACTER SPECIES  Final   Report Status 04/27/2017 FINAL  Final   Organism ID, Bacteria ACHROMOBACTER SPECIES  Final      Susceptibility   Achromobacter species - MIC*    CEFEPIME >=64 RESISTANT Resistant     CEFAZOLIN >=64 RESISTANT Resistant     GENTAMICIN 4 SENSITIVE Sensitive     CIPROFLOXACIN 2 INTERMEDIATE Intermediate     IMIPENEM 1 SENSITIVE Sensitive     TRIMETH/SULFA <=20 SENSITIVE Sensitive     * FEW ACHROMOBACTER SPECIES  Urine Culture     Status: None   Collection Time: 04/25/17 12:38 PM  Result Value Ref Range Status   Specimen Description URINE, CATHETERIZED  Final   Special Requests Normal  Final   Culture NO GROWTH  Final   Report Status 04/26/2017 FINAL  Final    Coagulation Studies: Recent Labs    04/29/17 0353 04/30/17 0525 05/01/17 0408   LABPROT 32.2* 25.9* 24.5*  INR 3.16 2.39 2.23    Urinalysis: No results for input(s): COLORURINE, LABSPEC, PHURINE, GLUCOSEU, HGBUR, BILIRUBINUR, KETONESUR, PROTEINUR, UROBILINOGEN, NITRITE, LEUKOCYTESUR in the last 72 hours.  Invalid input(s): APPERANCEUR    Imaging: No results found.   Medications:   . sodium chloride Stopped (04/30/17 0800)  . DOBUTamine 6 mcg/kg/min (05/01/17 0700)  . EPINEPHrine 4 mg in dextrose 5% 250 mL infusion (16 mcg/mL) 4 mcg/min (05/01/17 0700)  . meropenem (MERREM) IV Stopped (05/01/17 0458)  . norepinephrine (LEVOPHED) Adult infusion 4 mcg/min (05/01/17 0859)  . dialysis replacement fluid (prismasate) 400 mL/hr at 04/30/17 2116  . dialysis replacement fluid (prismasate) 200 mL/hr at 04/30/17 2215  . dialysate (PRISMASATE) 1,700 mL/hr at 05/01/17 0510  . sodium chloride     . aspirin EC  81 mg Oral Daily  . chlorhexidine  15 mL Mouth Rinse BID  . Chlorhexidine Gluconate Cloth  6 each Topical Daily  . feeding supplement (ENSURE ENLIVE)  237 mL Oral TID BM  . heparin injection (subcutaneous)  5,000 Units Subcutaneous Q8H  . mouth rinse  15 mL Mouth Rinse q12n4p  . pantoprazole  40 mg Oral Daily  . warfarin  1 mg Oral ONCE-1800  . Warfarin - Physician Dosing Inpatient   Does not apply q1800   heparin, levalbuterol, morphine injection, ondansetron (ZOFRAN) IV, oxyCODONE, sodium chloride, sodium chloride flush, sodium chloride flush  Assessment/ Plan:  1. 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 Pigment nephropathy  2. ICM s/p LVAD 12/27; on dobutamine 3. Shock on NE/Epi per AHF + TCTS 4. VDRF, now extubated 5. Hypophosphatemia, repleted 1/3; monitoring, now on diet might stablized w/o further IV phos 6. Elevated bilirubin; elevated transaminates, improving 7. Leukocytosis, +ancrhomobacter on trach aspirate 12/31 on meropenem; 12/31 B Cx x2 NGF 8. Anemia  P 1. Cont CRRT at current  settings.   No heparin as running well for past 24h 2. UOP is positive, follow today 3. Not ready to transition to iHD; no evidence of GFR recovery   Seen on CRRT   -- will not pull additional fluid today    LOS: 12 Dalonte Hardage W @TODAY @9 :02 AM

## 2017-05-01 NOTE — Progress Notes (Addendum)
ANTICOAGULATION CONSULT NOTE - Initial Consult  Pharmacy Consult for Coumadin Indication: LVAD  Allergies  Allergen Reactions  . Plavix [Clopidogrel Bisulfate] Hives    Patient Measurements: Height: 5\' 5"  (165.1 cm) Weight: 156 lb 4.9 oz (70.9 kg) IBW/kg (Calculated) : 61.5 Heparin Dosing Weight: n/a  Vital Signs: Temp: 98.4 F (36.9 C) (01/07 0300) Temp Source: Oral (01/07 0300) BP: 112/87 (01/07 0600) Pulse Rate: 30 (01/07 0700)  Labs: Recent Labs    04/29/17 0353  04/30/17 0525 04/30/17 1617 05/01/17 0408  HGB  --   --  8.5*  --   --   HCT  --   --  26.1*  --   --   PLT  --   --  265  --   --   LABPROT 32.2*  --  25.9*  --  24.5*  INR 3.16  --  2.39  --  2.23  CREATININE 1.68*  1.72*   < > 1.67* 1.76* 1.93*  1.88*   < > = values in this interval not displayed.    Estimated Creatinine Clearance: 39.5 mL/min (A) (by C-G formula based on SCr of 1.88 mg/dL (H)).   Medical History: Past Medical History:  Diagnosis Date  . AICD (automatic cardioverter/defibrillator) present   . Anemia   . Anxiety   . CAD (coronary artery disease) 2009   AMI in 12/2007 with PCI to LAD, staged PCI to  mid/distal RCA, NSTEMI in 02/2009 with BMS to LCx  . CHF (congestive heart failure) ([place])   . Chronic kidney disease 11/03/2016   stage 3 kidney disease  . Dysrhythmia    "arrythmia problems at some point", "fatal rhythms"  . History of blood transfusion    "I was bleeding from was rectum" (04/19/2017)  . HLD (hyperlipidemia)   . HTN (hypertension)   . Ischemic cardiomyopathy    Admitted in 07/2010 with CHF exacerbation  . Myocardial infarction ([place])    "I've had 4" (04/19/2017)  . Pneumonia 2018 X 1  . Seizures ([place])    one seizure in 04/2016 during cardiac event (04/19/2017)    Assessment: 52 yo male s/p LVAD implantation 12/27.  Initiated on warfarin 12/28, then stopped after 2 doses due to shock liver/rising INR. INR back in goal range this AM.  Discussed with Dr.  Haroldine Laws, and per Dr. Cyndia Bent okay to restart Coumadin today per pharmacy dosing 1/6.    Coumadin given last night 1/6 (1 mg); will give another small dose today and follow response closely. INR stable at 2.2 this morning, no bleeding issues noted.   LFTs continue to trend down, LDH stable 441. Patient continues on SQ heparin for px as true anticoagulation state unclear with liver dysfunction. Will follow plan/length of therapy of this carefully.   Goal of Therapy:  INR 2-2.5 Monitor platelets by anticoagulation protocol: Yes   Plan:  1. Coumadin 1mg  tonight 2. Daily INR.  Erin Hearing PharmD., BCPS Clinical Pharmacist 05/01/2017 7:30 AM

## 2017-05-01 NOTE — Progress Notes (Signed)
Patient ID: David [name], male   DOB: February 29, 1966, 52 y.o.   MRN: [id number] HeartMate 2 Rounding Note  Subjective:    No specific complaints. Bowels working. Passed 150 cc dark urine yesterday. 45 cc urine on bladder scan this am.  Remains on levophed 5, dobutamine 6, epi 4.  Co-ox 59.3 CVP 12  Remains on CRRT with -50 cc/hr fluid balance   LVAD INTERROGATION:  HeartMate II LVAD:  Flow 4.4 liters/min, speed 5300, power 3.9, PI 4.4.  No PI events  Objective:    Vital Signs:   Temp:  [97.4 F (36.3 C)-98.4 F (36.9 C)] 97.9 F (36.6 C) (01/07 0746) Pulse Rate:  [30-106] 30 (01/07 0700) Resp:  [19-37] 29 (01/07 0700) BP: (83-112)/(54-98) 112/87 (01/07 0746) SpO2:  [92 %-100 %] 97 % (01/07 0746) Weight:  [70.9 kg (156 lb 4.9 oz)] 70.9 kg (156 lb 4.9 oz) (01/07 0500) Last BM Date: 04/30/17 Mean arterial Pressure 82  Intake/Output:   Intake/Output Summary (Last 24 hours) at 05/01/2017 0754 Last data filed at 05/01/2017 0700 Gross per 24 hour  Intake 2148.8 ml  Output 3516 ml  Net -1367.2 ml     Physical Exam: General:  Chronically ill-appearing but awake and alert. No resp difficulty HEENT: abrasions on nose from tape Neck: left IJ central line Cor: Normal heart sounds with LVAD hum present. Lungs: clear Abdomen: soft, nontender, nondistended. Good bowel sounds. Extremities: minimal edema in arms Neuro: alert & orientedx3,  moves all 4 extremities w/o difficulty. Affect pleasant  Telemetry: sinus rhythm  Labs: Basic Metabolic Panel: Recent Labs  Lab 04/27/17 0349  04/28/17 0400 04/28/17 1630 04/29/17 0353 04/29/17 2030 04/30/17 0525 04/30/17 1617 05/01/17 0408  NA 136   < > 136 135 135  135 129* 129* 130* 132*  132*  K 4.0   < > 4.0 4.0 4.1  4.0 4.3 4.2 4.5 4.4  4.4  CL 102   < > 103 101 100*  101 97* 95* 98* 98*  98*  CO2 24   < > 26 26 27  26 25 26 25 27  27   GLUCOSE 80   < > 96 124* 100*  101* 98 109* 104* 104*  102*  BUN 16   < > 13 12 13   12 10 11 9 10  10   CREATININE 1.64*   < > 1.69* 1.61* 1.68*  1.72* 1.65* 1.67* 1.76* 1.93*  1.88*  CALCIUM 8.1*   < > 8.0* 8.0* 7.8*  8.3* 7.6* 7.9* 7.9* 7.9*  7.9*  MG 2.6*  --  2.6*  --  2.3  --  2.4  --  2.4  PHOS 2.2*   < > 2.3* 1.9* 2.8 2.4*  --  2.2* 2.2*   < > = values in this interval not displayed.    Liver Function Tests: Recent Labs  Lab 04/27/17 0349 04/27/17 1050  04/28/17 0400  04/29/17 0353 04/29/17 2030 04/30/17 0525 04/30/17 1617 05/01/17 0408  AST 149*  --   --  119*  --  99*  --  109*  --  96*  ALT 144*  --   --  112*  --  92*  --  88*  --  74*  ALKPHOS 191*  --   --  209*  --  253*  --  397*  --  423*  BILITOT 10.7* 10.8*  --  10.7*  --  9.2*  --  8.5*  --  6.1*  PROT 6.5  --   --  6.5  --  6.5  --  6.7  --  6.8  ALBUMIN 1.9*  --    < > 1.8*   < > 1.8*  1.8* 1.8* 1.7* 1.8* 1.8*  1.7*   < > = values in this interval not displayed.   No results for input(s): LIPASE, AMYLASE in the last 168 hours. No results for input(s): AMMONIA in the last 168 hours.  CBC: Recent Labs  Lab 04/25/17 0359 04/26/17 0343 04/27/17 0349 04/28/17 0400 04/30/17 0525  WBC 14.3* 13.5* 19.0* 21.4* 21.8*  NEUTROABS 11.8* 11.2* 16.6*  --   --   HGB 8.1* 8.9* 9.2* 8.9* 8.5*  HCT 25.1* 26.9* 28.7* 27.7* 26.1*  MCV 77.5* 78.4 77.2* 77.8* 78.1  PLT 118* 114* 133* 181 265    INR: Recent Labs  Lab 04/27/17 0349 04/28/17 0400 04/29/17 0353 04/30/17 0525 05/01/17 0408  INR 3.03 3.80 3.16 2.39 2.23    Other results:  EKG:   Imaging:  No results found.   Medications:     Scheduled Medications: . aspirin EC  81 mg Oral Daily  . chlorhexidine  15 mL Mouth Rinse BID  . Chlorhexidine Gluconate Cloth  6 each Topical Daily  . feeding supplement (ENSURE ENLIVE)  237 mL Oral TID BM  . heparin injection (subcutaneous)  5,000 Units Subcutaneous Q8H  . mouth rinse  15 mL Mouth Rinse q12n4p  . pantoprazole  40 mg Oral Daily  . warfarin  1 mg Oral ONCE-1800  .  Warfarin - Physician Dosing Inpatient   Does not apply q1800     Infusions: . sodium chloride Stopped (04/30/17 0800)  . DOBUTamine 6 mcg/kg/min (05/01/17 0700)  . EPINEPHrine 4 mg in dextrose 5% 250 mL infusion (16 mcg/mL) 4 mcg/min (05/01/17 0700)  . meropenem (MERREM) IV Stopped (05/01/17 0458)  . norepinephrine (LEVOPHED) Adult infusion 5.013 mcg/min (05/01/17 0700)  . dialysis replacement fluid (prismasate) 400 mL/hr at 04/30/17 2116  . dialysis replacement fluid (prismasate) 200 mL/hr at 04/30/17 2215  . dialysate (PRISMASATE) 1,700 mL/hr at 05/01/17 0510  . sodium chloride       PRN Medications:  heparin, levalbuterol, morphine injection, ondansetron (ZOFRAN) IV, oxyCODONE, sodium chloride, sodium chloride flush, sodium chloride flush   Assessment:   1. Ischemic cardiomyopathy s/p multiple PCI's with chronic systolic heart failure and EF 20% on home dobutamine preop with significant improvement in symptoms. [facility]uated at [facility] for [facility] transplant but LVAD felt to be the best treatment for him at this time. POD12s/p HeartMate III LVAD. He is critically ill with multi-system organ failure but improving.Hemodynamics stable on current pressors. MAP in the 80's on levophed. Wean as tolerated. I think a MAP in the 70-80 range is probably a good goal at this point. 2.H/OHypertension 3.H/OHyperlipidemia 4. PAF in sinuspreopon amiodarone and Eliquis.Now back in sinus off amiodarone.  5.H/OStage III CKD. Acute on chronic postop renal failurewith anuria now on CRRT.  6. LBBB s/p CRT-D upgrade in 10/2016 without improvement in symptoms or EF. LV epicardial leads cut off at the time of surgery. 7. Prior smoking with mild obstruction and severe diffusion defect on PFT's. CXRhas been stable. Continue IS. 8. Postop leukocytosis and fever. WBC ct still hanging at 21-22. Sputum culture grew a few achromobacter resistant to Maxipime so broadened to meropenem. WBC trending up a  little but afebrile. Central line is new. Dialysis catheter placed a week ago. 9. Marked rise in bilirubin and transaminases. Likely due to pump runand right sided congestion, hypotension.Bili  and transaminases improving. 10. INR is2.23 today. Coumadin per pharmacy.     11. Taking PO fairly well. Continue to encourage nutrition.     12. PT    Plan/Discussion:    Overall he has been stable. I would accept MAP in the 70-80 range and wean levophed slowly as tolerated. His Co-ox is up and down and will be mostly dependent on PO2 at this point which is going to be on the low side with his lung disease. VAD flow has been great. I think he is euvolemic with CVP 12 and no peripheral edema so could keep I/O even.  I reviewed the LVAD parameters from today, and compared the results to the patient's prior recorded data.  No programming changes were made.  The LVAD is functioning within specified parameters.   LVAD interrogation was negative for any significant power changes, alarms or PI events/speed drops.  LVAD equipment check completed and is in good working order.  Back-up equipment present.   LVAD education done on emergency procedures and precautions and reviewed exit site care.  Length of Stay: [address]  Fernande Boyden [facility] 05/01/2017, 7:54 AM

## 2017-05-01 NOTE — Progress Notes (Addendum)
Physical Therapy Treatment Patient Details Name: David [name] MRN: [id number] DOB: 02/22/1966 Today's Date: 05/01/2017    History of Present Illness This 52 y.o. male admitted for LVAD. Pt extubated 12/28.   on 12/30, pt developed respiratory distress with pulmonary edema, and increased creatanine.  He was intubated 12/30 > 04/26/16, and CRRT initiated 12/30>.    PMH includes:  Acute on chronic systolic HF, CAD, PAF, CKD stage III, LBBB, frequent PVCs, seizures, PNS, MI, ischemic cardiomyopathy, s/p AICD     PT Comments    Pt making slow progress. Able to amb short distance today while off CRRT for filter change. Fatigued quickly with amb which isn't unexpected considering complicated course. May need to look at CIR if activity tolerance doesn't improve quickly once off CRRT.    Follow Up Recommendations  Supervision/Assistance - 24 hour;CIR     Equipment Recommendations  Other (comment)(rollator)    Recommendations for Other Services       Precautions / Restrictions Precautions Precautions: Fall;Sternal;Other (comment) Precaution Comments: requires min cues for sternal precautions; pt on CRRT Restrictions Other Position/Activity Restrictions: sternal precautions    Mobility  Bed Mobility Overal bed mobility: Needs Assistance Bed Mobility: Supine to Sit     Supine to sit: Min assist;+2 for safety/equipment;HOB elevated     General bed mobility comments: Assist for safety and to manage lines/tubes. Verbal cues for sternal precautions  Transfers Overall transfer level: Needs assistance Equipment used: 2 person hand held assist Transfers: Sit to/from Stand;Stand Pivot Transfers Sit to Stand: Min assist;+2 safety/equipment Stand pivot transfers: Min assist;+2 safety/equipment       General transfer comment: Assist to bring hips up and for balance and to manage lines. Verbal cues for sternal precautions  Ambulation/Gait Ambulation/Gait assistance: +2 physical  assistance;Min assist Ambulation Distance (Feet): 45 Feet Assistive device: 4-wheeled walker Gait Pattern/deviations: Step-through pattern;Decreased step length - right;Decreased step length - left;Trunk flexed Gait velocity: decr Gait velocity interpretation: Below normal speed for age/gender General Gait Details: assist for balance and support. Pt fatigued quickly and had to sit on rollator and pushed back to room with rollator. Frequent verbal cues to hold head up during amb.   Stairs            Wheelchair Mobility    Modified Rankin (Stroke Patients Only)       Balance Overall balance assessment: Needs assistance Sitting-balance support: Feet supported Sitting balance-Leahy Scale: Fair     Standing balance support: Single extremity supported Standing balance-Leahy Scale: Poor Standing balance comment: Hand held assist for static standing                            Cognition Arousal/Alertness: Awake/alert Behavior During Therapy: WFL for tasks assessed/performed Overall Cognitive Status: Impaired/Different from baseline Area of Impairment: Attention;Safety/judgement                   Current Attention Level: Sustained;Selective     Safety/Judgement: Decreased awareness of safety;Decreased awareness of deficits            Exercises      General Comments        Pertinent Vitals/Pain Pain Assessment: No/denies pain    Home Living                      Prior Function            PT Goals (current goals can now be found in  the care plan section) Progress towards PT goals: Progressing toward goals    Frequency    Min 3X/week      PT Plan Discharge plan needs to be updated    Co-evaluation              AM-PAC PT "6 Clicks" Daily Activity  Outcome Measure  Difficulty turning over in bed (including adjusting bedclothes, sheets and blankets)?: Unable Difficulty moving from lying on back to sitting on the side  of the bed? : Unable Difficulty sitting down on and standing up from a chair with arms (e.g., wheelchair, bedside commode, etc,.)?: Unable Help needed moving to and from a bed to chair (including a wheelchair)?: A Little Help needed walking in hospital room?: A Little Help needed climbing 3-5 steps with a railing? : Total 6 Click Score: 10    End of Session Equipment Utilized During Treatment: Oxygen Activity Tolerance: Patient tolerated treatment well Patient left: with call bell/phone within reach;in chair;with nursing/sitter in room Nurse Communication: Mobility status(nurse assisted with mobility) PT Visit Diagnosis: Unsteadiness on feet (R26.81);Muscle weakness (generalized) (M62.81);Difficulty in walking, not elsewhere classified (R26.2)     Time: 9758-8325 PT Time Calculation (min) (ACUTE ONLY): 41 min  Charges:  $Gait Training: 38-52 mins                    G Codes:       [facility] PT [place] 05/01/2017, 6:32 PM

## 2017-05-01 NOTE — Progress Notes (Signed)
Patient ID: David [name], male   DOB: 02/08/1966, 52 y.o.   MRN: [id number]   Advanced Heart Failure VAD Team Note  Subjective:    David [name] is a 52 y.o. male with a PMH of ICM ('09 PCI to LAD, mRCA, '10 PCI to Lcx and '12 mRCA), HFrEF (EF ~20% for >5 years), HLD and HTN admitted 12/26 for VAD placement due to end-stage systolic HF.   Underwent successful HM-3 LVAD placement 04/20/17.  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   -Extubated 12/28.  -Developed AKI. CVVHD started 12/30 -ReIntubated 04/23/17  -Developed progressive shock with uptitration of norepinephrine and epinephrine and addition of vasopressin.  Milrinone stopped and dobutamine begun.  CXR with CHF.   -Re-extubated 04/26/2017 -Started on Meropenem 04/27/16 with + Achromobacter in tracheal aspirate.  Remains on dobutamine 6, epi 4, NE 5. On CVVHD pulling - 50.  150 cc urine out.   Denies SOB.   LDH 423 INR  INR 2.23  LFTs elevated with tbili 9.6 -> 10.1 -> 10.7 -> 9.2 -> 8.5->6.1   Echo 12/31 reviewed: No significant pericardial effusion, IV septum midline to slightly to the right, RV function adequate.   LVAD INTERROGATION:  HeartMate III LVAD:  Flow  4.6 liters/min, speed 5300, power 4, PI 3.8   Objective:    Vital Signs:   Temp:  [97.4 F (36.3 C)-98.4 F (36.9 C)] 98.4 F (36.9 C) (01/07 0300) Pulse Rate:  [30-106] 30 (01/07 0700) Resp:  [19-37] 29 (01/07 0700) BP: (83-112)/(54-98) 112/87 (01/07 0600) SpO2:  [92 %-100 %] 100 % (01/07 0715) Weight:  [156 lb 4.9 oz (70.9 kg)] 156 lb 4.9 oz (70.9 kg) (01/07 0500) Last BM Date: 04/30/17 Mean arterial Pressure 80-90s  Intake/Output:   Intake/Output Summary (Last 24 hours) at 05/01/2017 0727 Last data filed at 05/01/2017 0700 Gross per 24 hour  Intake 2148.8 ml  Output 3516 ml  Net -1367.2 ml     Physical Exam  Physical Exam: CVP ~12 GENERAL: Sitting in the chair.  HEENT: normal  NECK: Supple, JVP 11-12 .  2+ bilaterally, no  bruits.  No lymphadenopathy or thyromegaly appreciated.  David [name] CARDIAC:  Mechanical heart sounds with LVAD hum present.  LUNGS:  Clear to auscultation bilaterally.  ABDOMEN:  Soft, round, nontender, distended,  positive bowel sounds x4.     LVAD exit site: Dressing dry and intact.  No erythema or drainage.  Stabilization device present and accurately applied.  Driveline dressing is being changed daily per sterile technique. EXTREMITIES:  Warm and dry, no cyanosis, clubbing, rash or edema  NEUROLOGIC:  Alert and oriented x 4.  Gait steady.  No aphasia.  No dysarthria.  Affect pleasant.      Telemetry   NSR 90s personally reviewed   Labs   Basic Metabolic Panel: Recent Labs  Lab 04/27/17 0349  04/28/17 0400 04/28/17 1630 04/29/17 0353 04/29/17 2030 04/30/17 0525 04/30/17 1617 05/01/17 0408  NA 136   < > 136 135 135  135 129* 129* 130* 132*  132*  K 4.0   < > 4.0 4.0 4.1  4.0 4.3 4.2 4.5 4.4  4.4  CL 102   < > 103 101 100*  101 97* 95* 98* 98*  98*  CO2 24   < > 26 26 27  26 25 26 25 27  27   GLUCOSE 80   < > 96 124* 100*  101* 98 109* 104* 104*  102*  BUN  16   < > 13 12 13  12 10 11 9 10  10   CREATININE 1.64*   < > 1.69* 1.61* 1.68*  1.72* 1.65* 1.67* 1.76* 1.93*  1.88*  CALCIUM 8.1*   < > 8.0* 8.0* 7.8*  8.3* 7.6* 7.9* 7.9* 7.9*  7.9*  MG 2.6*  --  2.6*  --  2.3  --  2.4  --  2.4  PHOS 2.2*   < > 2.3* 1.9* 2.8 2.4*  --  2.2* 2.2*   < > = values in this interval not displayed.    Liver Function Tests: Recent Labs  Lab 04/27/17 0349 04/27/17 1050  04/28/17 0400  04/29/17 0353 04/29/17 2030 04/30/17 0525 04/30/17 1617 05/01/17 0408  AST 149*  --   --  119*  --  99*  --  109*  --  96*  ALT 144*  --   --  112*  --  92*  --  88*  --  74*  ALKPHOS 191*  --   --  209*  --  253*  --  397*  --  423*  BILITOT 10.7* 10.8*  --  10.7*  --  9.2*  --  8.5*  --  6.1*  PROT 6.5  --   --  6.5  --  6.5  --  6.7  --  6.8  ALBUMIN 1.9*  --    < > 1.8*   < >  1.8*  1.8* 1.8* 1.7* 1.8* 1.8*  1.7*   < > = values in this interval not displayed.   No results for input(s): LIPASE, AMYLASE in the last 168 hours. No results for input(s): AMMONIA in the last 168 hours.  CBC: Recent Labs  Lab 04/25/17 0359 04/26/17 0343 04/27/17 0349 04/28/17 0400 04/30/17 0525  WBC 14.3* 13.5* 19.0* 21.4* 21.8*  NEUTROABS 11.8* 11.2* 16.6*  --   --   HGB 8.1* 8.9* 9.2* 8.9* 8.5*  HCT 25.1* 26.9* 28.7* 27.7* 26.1*  MCV 77.5* 78.4 77.2* 77.8* 78.1  PLT 118* 114* 133* 181 265    INR: Recent Labs  Lab 04/27/17 0349 04/28/17 0400 04/29/17 0353 04/30/17 0525 05/01/17 0408  INR 3.03 3.80 3.16 2.39 2.23    Other results:  Imaging   No results found.   Medications:     Scheduled Medications: . aspirin  325 mg Oral Daily  . chlorhexidine  15 mL Mouth Rinse BID  . Chlorhexidine Gluconate Cloth  6 each Topical Daily  . feeding supplement (ENSURE ENLIVE)  237 mL Oral TID BM  . heparin injection (subcutaneous)  5,000 Units Subcutaneous Q8H  . levalbuterol  1.25 mg Nebulization TID  . mouth rinse  15 mL Mouth Rinse q12n4p  . pantoprazole  40 mg Oral Daily  . Warfarin - Physician Dosing Inpatient   Does not apply q1800    Infusions: . sodium chloride Stopped (04/30/17 0800)  . DOBUTamine 6 mcg/kg/min (05/01/17 0700)  . EPINEPHrine 4 mg in dextrose 5% 250 mL infusion (16 mcg/mL) 4 mcg/min (05/01/17 0700)  . meropenem (MERREM) IV Stopped (05/01/17 0458)  . norepinephrine (LEVOPHED) Adult infusion 5.013 mcg/min (05/01/17 0700)  . dialysis replacement fluid (prismasate) 400 mL/hr at 04/30/17 2116  . dialysis replacement fluid (prismasate) 200 mL/hr at 04/30/17 2215  . dialysate (PRISMASATE) 1,700 mL/hr at 05/01/17 0510  . sodium chloride      PRN Medications: heparin, morphine injection, ondansetron (ZOFRAN) IV, oxyCODONE, sodium chloride, sodium chloride flush, sodium chloride flush   Patient  Profile   David [name] is a 52 y.o. male with  a PMH of ICM ('09 PCI to LAD, mRCA, '10 PCI to Lcx and '12 mRCA), HFrEF (EF ~20% for >5 years) s/p HM-III VAD placement on 12/27 due to end-stage systolic HF.   Assessment/Plan:    1. Acute on chronic systolic HF: Echo 37/10/62 with LVEF 20-25%, Mild MR, Mild/Mod MR, Severe LAE, Mildly reduced RV, PA peak pressure 56 mm Hg.  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 Extubated, initially stopped epinephrine/norepinephrine but restarted with fall in UOP and MAP.  With progressive renal failure, pulmonary edema, and hypotension, he was re-intubated on 12/30.   CVVH was begun.  12/31 was difficult day with refractory shock (MAP in 60s).  Reviewed 12/31 echo which showed no significant pericardial effusion, adequate RV function, and IV septum near mid-line. Given suspected for vasodilatory/septic shock with PCT 31 04/24/17, he is covered with cefepime.   - CO-OX 59%. Continue on Dobutamine 6, EPI 4 and NE 5. Will not wean currently. Will wait for end-organ function to improve more completely then start wean.  - Continue CVVH running pulling 50cc  - Cut back aspirin 81 mg daily.  - Warfarin goal INR 2-2.5. INR 2.23 - LDH 423 2. CAD s/p PCI to mid RCA and PLOM with DES x 2 and DES to mid LAD in 1/18.   \- No s/s ischemia.  - Continue ASA and statin. No change.   3. PAF: Remains in NSR.  4. ARF on CKD stage III: Suspect some degree of intrinsic renal disease prior to LVAD placement (baseline creatinine around 1.5) with probable ATN from intra-op/peri-op hypotension and development of vasodilatory/septic shock.   - Remains on CVVHD.  - Making ~150 cc urine.  - Appreciate Renal in put.  - Weight at baseline.  5. ABLA, expected:   - Hgb 8.5 this am s/p 1 u PRBCs 12/30 and 04/25/17.  - CBC in am.  - Hgb 9.2 on CO-OX. 6. ID: Started on cefepime empirically with septic/vasodilatory shock, PCT was 31 04/25/17 Cultures (blood, sputum) NGTD   - Improving. PCT now coming down.  WBC stable at 21k - ABX broadened to Meropenem 04/27/16 with + Achromobacter on tracheal aspirate.  - PCT 8.31 04/27/17 -> 3.87  7. Elevated LFTs due to shock liver - Continues to improve.    Length of Stay: [place], NP 05/01/2017, 7:27 AM  VAD Team --- VAD ISSUES ONLY--- Pager [phone] (7am - 7am)  Advanced Heart Failure Team  Pager [phone] (M-F; 7a - 4p)  Please contact [facility] for night-coverage after hours (4p -7a ) and weekends on [email or web address]  Patient seen and examined with Darrick Grinder, NP. We discussed all aspects of the encounter. I agree with the assessment and plan as stated above.   Making slow progress. Still on triple pressors but co-ox improving. Remains on CVVHD but made a little urine today. Bilirubin coming down. Albumin 1.7.  Would continue pressors at current level until we see more end-organ recovery. Encourage po intake. Continue meropenem. INR 2.3. On coumadin per pharmacy. Discussed with them personally. VAD interrogated personally. Parameters stable.  Discussed with Dr. Cyndia Bent at bedside.   Glori Bickers, MD  9:53 PM

## 2017-05-01 NOTE — Progress Notes (Signed)
Per Dr Justin Mend and Dr Cyndia Bent, keep CRRT even. RN will continue to monitor.  Nikki Dom

## 2017-05-01 NOTE — Progress Notes (Signed)
Presented to patients room today for drive line exit wound care. Nurse pre-medicated patient with IV MS prior to dressing change due to patient's intolerance of dressing removal due to pain. Pt refused dressing change yesterday due to c/o pain when removing tape.  Existing VAD dressing removed and site care performed using sterile technique. Drive line exit site cleaned with Chlora prep applicators x 2, allowed to dry, and aqaucell silver strip with daily dressing kit re-applied. Exit site is red with small amount bloody drainage; one suture remains intact. The velour is not yet incorporated. No tenderness, drainage, foul odor or rash noted. Drive line anchor x 2 re-applied.       Zada Girt RN, VAD Coordinator 24/7 VAD pager:  [phone]

## 2017-05-01 NOTE — Progress Notes (Signed)
CSW met with patient at bedside. Patient was in good spirits and stated he was able to get out of bed and sit in chair for a few hours this morning. Patient's wife went home to get some things done and will return later. Patient reports many of his family visited over the weekend and he enjoyed time with his daughters. Patient appears to be coping with new VAD and denies any concerns at this time. CSW continues to follow as needed. Raquel Sarna, [place], [place]

## 2017-05-01 NOTE — Progress Notes (Signed)
Pt CRRT filter clotted, PT here to work with patient. Pt ambulate in hall with PT and myself. Tolerated well. Back to chair. CRRT initiated.

## 2017-05-02 DIAGNOSIS — R570 Cardiogenic shock: Secondary | ICD-10-CM

## 2017-05-02 LAB — COMPREHENSIVE METABOLIC PANEL
ALK PHOS: 458 U/L — AB (ref 38–126)
ALT: 66 U/L — AB (ref 17–63)
AST: 92 U/L — ABNORMAL HIGH (ref 15–41)
Albumin: 1.6 g/dL — ABNORMAL LOW (ref 3.5–5.0)
Anion gap: 6 (ref 5–15)
BILIRUBIN TOTAL: 5.2 mg/dL — AB (ref 0.3–1.2)
BUN: 10 mg/dL (ref 6–20)
CALCIUM: 7.8 mg/dL — AB (ref 8.9–10.3)
CO2: 26 mmol/L (ref 22–32)
CREATININE: 1.93 mg/dL — AB (ref 0.61–1.24)
Chloride: 97 mmol/L — ABNORMAL LOW (ref 101–111)
GFR, EST AFRICAN AMERICAN: 44 mL/min — AB (ref >60)
GFR, EST NON AFRICAN AMERICAN: 38 mL/min — AB (ref >60)
Glucose, Bld: 94 mg/dL (ref 65–99)
Potassium: 4.4 mmol/L (ref 3.5–5.1)
Sodium: 129 mmol/L — ABNORMAL LOW (ref 135–145)
TOTAL PROTEIN: 6.4 g/dL — AB (ref 6.5–8.1)

## 2017-05-02 LAB — CBC
HCT: 28.7 % — ABNORMAL LOW (ref 39.0–52.0)
HEMOGLOBIN: 9.3 g/dL — AB (ref 13.0–17.0)
MCH: 26.1 pg (ref 26.0–34.0)
MCHC: 32.4 g/dL (ref 30.0–36.0)
MCV: 80.6 fL (ref 78.0–100.0)
PLATELETS: 321 10*3/uL (ref 150–400)
RBC: 3.56 MIL/uL — AB (ref 4.22–5.81)
RDW: 21.7 % — ABNORMAL HIGH (ref 11.5–15.5)
WBC: 22.6 10*3/uL — ABNORMAL HIGH (ref 4.0–10.5)

## 2017-05-02 LAB — RENAL FUNCTION PANEL
Albumin: 1.8 g/dL — ABNORMAL LOW (ref 3.5–5.0)
Anion gap: 7 (ref 5–15)
BUN: 11 mg/dL (ref 6–20)
CHLORIDE: 98 mmol/L — AB (ref 101–111)
CO2: 27 mmol/L (ref 22–32)
Calcium: 8 mg/dL — ABNORMAL LOW (ref 8.9–10.3)
Creatinine, Ser: 1.84 mg/dL — ABNORMAL HIGH (ref 0.61–1.24)
GFR calc Af Amer: 47 mL/min — ABNORMAL LOW (ref >60)
GFR calc non Af Amer: 40 mL/min — ABNORMAL LOW (ref >60)
GLUCOSE: 89 mg/dL (ref 65–99)
Phosphorus: 2.3 mg/dL — ABNORMAL LOW (ref 2.5–4.6)
Potassium: 4.7 mmol/L (ref 3.5–5.1)
Sodium: 132 mmol/L — ABNORMAL LOW (ref 135–145)

## 2017-05-02 LAB — PROCALCITONIN: PROCALCITONIN: 1.21 ng/mL

## 2017-05-02 LAB — PREPARE RBC (CROSSMATCH)

## 2017-05-02 LAB — COOXEMETRY PANEL
Carboxyhemoglobin: 2.4 % — ABNORMAL HIGH (ref 0.5–1.5)
Methemoglobin: 1.3 % (ref 0.0–1.5)
O2 SAT: 54 %
TOTAL HEMOGLOBIN: 10.5 g/dL — AB (ref 12.0–16.0)

## 2017-05-02 LAB — MAGNESIUM: Magnesium: 2.3 mg/dL (ref 1.7–2.4)

## 2017-05-02 LAB — PROTIME-INR
INR: 1.7
PROTHROMBIN TIME: 19.8 s — AB (ref 11.4–15.2)

## 2017-05-02 LAB — PHOSPHORUS: Phosphorus: 2.2 mg/dL — ABNORMAL LOW (ref 2.5–4.6)

## 2017-05-02 LAB — HEMOGLOBIN AND HEMATOCRIT, BLOOD
HCT: 23.9 % — ABNORMAL LOW (ref 39.0–52.0)
HEMOGLOBIN: 7.6 g/dL — AB (ref 13.0–17.0)

## 2017-05-02 LAB — LACTATE DEHYDROGENASE: LDH: 434 U/L — AB (ref 98–192)

## 2017-05-02 MED ORDER — SIMETHICONE 80 MG PO CHEW: 80.0000 mg | CHEWABLE_TABLET | Freq: Four times a day (QID) | ORAL | Status: DC | PRN

## 2017-05-02 MED ORDER — WARFARIN SODIUM 2 MG PO TABS
2.0000 mg | ORAL_TABLET | Freq: Once | ORAL | Status: AC
  Administered 2017-05-02: 2 mg via ORAL
  Filled 2017-05-02: qty 1

## 2017-05-02 MED ORDER — SODIUM CHLORIDE 0.9 % IV SOLN: Freq: Once | INTRAVENOUS | Status: DC

## 2017-05-02 NOTE — Progress Notes (Signed)
CSW met at bedside with patient and wife. Patient just finished OT and feeling "great". Patient stated he feels like he is making progress but states "I didn't think it would be this hard". Wife very supportive and encouraged patient to get on moving forward. CSW provided supportive intervention and encouragement with recovery. CSW continues to follow throughout implant hospitalization. Raquel Sarna, [place], [place]

## 2017-05-02 NOTE — Progress Notes (Signed)
Patient ID: David [name], male   DOB: 21-Jul-1965, 52 y.o.   MRN: [id number]   Advanced Heart Failure VAD Team Note  Subjective:    David [name] is a 52 y.o. male with a PMH of ICM ('09 PCI to LAD, mRCA, '10 PCI to Lcx and '12 mRCA), HFrEF (EF ~20% for >5 years), HLD and HTN admitted 12/26 for VAD placement due to end-stage systolic HF.   Underwent successful HM-3 LVAD placement 04/20/17.  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   -Extubated 12/28.  -Developed AKI. CVVHD started 12/30 -ReIntubated 04/23/17  -Developed progressive shock with uptitration of norepinephrine and epinephrine and addition of vasopressin.  Milrinone stopped and dobutamine begun.  CXR with CHF.   -Re-extubated 04/26/2017 -Started on Meropenem 04/27/16 with + Achromobacter in tracheal aspirate.  Remains on dobutamine 6 and epi 4 mcg. Off NE. On CVVHD pulling - 50. ~60 cc urine output.   Denies SOB.   LDH 434  INR 1.7   LFTs elevated with tbili 9.6 -> 10.1 -> 10.7 -> 9.2 -> 8.5->6.1   Echo 12/31 reviewed: No significant pericardial effusion, IV septum midline to slightly to the right, RV function adequate.   LVAD INTERROGATION:  HeartMate III LVAD:  Flow  4.3  liters/min, speed 5300, power 4, PI 5   Objective:    Vital Signs:   Temp:  [98.2 F (36.8 C)-99.5 F (37.5 C)] 98.6 F (37 C) (01/07 2300) Pulse Rate:  [30-134] 55 (01/08 0700) Resp:  [17-31] 27 (01/08 0700) BP: (81-101)/(44-89) 88/73 (01/08 0600) SpO2:  [77 %-100 %] 94 % (01/08 0700) Weight:  [157 lb 13.6 oz (71.6 kg)] 157 lb 13.6 oz (71.6 kg) (01/08 0500) Last BM Date: 05/01/17 Mean arterial Pressure 70-90s   Intake/Output:   Intake/Output Summary (Last 24 hours) at 05/02/2017 0807 Last data filed at 05/02/2017 0700 Gross per 24 hour  Intake 1476.41 ml  Output 1865 ml  Net -388.59 ml     Physical Exam  Physical Exam: CVP ~14  GENERAL: Well appearing, sitting in the chair.  HEENT: normal  NECK: Supple, JVP 11-12 .  2+  bilaterally, no bruits.  No lymphadenopathy or thyromegaly appreciated. LIJRenata Caprice HD  CARDIAC:  Mechanical heart sounds with LVAD hum present.  LUNGS:  Clear to auscultation bilaterally.  ABDOMEN:  Soft, round, nontender, positive bowel sounds x4.     LVAD exit site:  Dressing dry and intact.  No erythema or drainage.  Stabilization device present and accurately applied.  Driveline dressing is being changed daily per sterile technique. EXTREMITIES:  Warm and dry, no cyanosis, clubbing, rash or edema  NEUROLOGIC:  Alert and oriented x 4.  Gait steady.  No aphasia.  No dysarthria.  Affect pleasant.     Telemetry   NSR 90s personally reviewed.   Labs   Basic Metabolic Panel: Recent Labs  Lab 04/28/17 0400  04/29/17 0353 04/29/17 2030 04/30/17 0525 04/30/17 1617 05/01/17 0408 05/01/17 1525 05/02/17 0335  NA 136   < > 135  135 129* 129* 130* 132*  132* 131* 129*  K 4.0   < > 4.1  4.0 4.3 4.2 4.5 4.4  4.4 4.6 4.4  CL 103   < > 100*  101 97* 95* 98* 98*  98* 99* 97*  CO2 26   < > 27  26 25 26 25 27  27 26 26   GLUCOSE 96   < > 100*  101* 98 109* 104* 104*  102* 104*  94  BUN 13   < > 13  12 10 11 9 10  10 13 10   CREATININE 1.69*   < > 1.68*  1.72* 1.65* 1.67* 1.76* 1.93*  1.88* 2.18* 1.93*  CALCIUM 8.0*   < > 7.8*  8.3* 7.6* 7.9* 7.9* 7.9*  7.9* 7.8* 7.8*  MG 2.6*  --  2.3  --  2.4  --  2.4  --  2.3  PHOS 2.3*   < > 2.8 2.4*  --  2.2* 2.2* 2.5 2.2*   < > = values in this interval not displayed.    Liver Function Tests: Recent Labs  Lab 04/28/17 0400  04/29/17 0353  04/30/17 0525 04/30/17 1617 05/01/17 0408 05/01/17 1525 05/02/17 0335  AST 119*  --  99*  --  109*  --  96*  --  92*  ALT 112*  --  92*  --  88*  --  74*  --  66*  ALKPHOS 209*  --  253*  --  397*  --  423*  --  458*  BILITOT 10.7*  --  9.2*  --  8.5*  --  6.1*  --  5.2*  PROT 6.5  --  6.5  --  6.7  --  6.8  --  6.4*  ALBUMIN 1.8*   < > 1.8*  1.8*   < > 1.7* 1.8* 1.8*  1.7* 1.7* 1.6*   < > =  values in this interval not displayed.   No results for input(s): LIPASE, AMYLASE in the last 168 hours. No results for input(s): AMMONIA in the last 168 hours.  CBC: Recent Labs  Lab 04/26/17 0343 04/27/17 0349 04/28/17 0400 04/30/17 0525 05/02/17 0335  WBC 13.5* 19.0* 21.4* 21.8*  --   NEUTROABS 11.2* 16.6*  --   --   --   HGB 8.9* 9.2* 8.9* 8.5* 7.6*  HCT 26.9* 28.7* 27.7* 26.1* 23.9*  MCV 78.4 77.2* 77.8* 78.1  --   PLT 114* 133* 181 265  --     INR: Recent Labs  Lab 04/28/17 0400 04/29/17 0353 04/30/17 0525 05/01/17 0408 05/02/17 0335  INR 3.80 3.16 2.39 2.23 1.70    Other results:  Imaging   No results found.   Medications:     Scheduled Medications: . aspirin EC  81 mg Oral Daily  . chlorhexidine  15 mL Mouth Rinse BID  . Chlorhexidine Gluconate Cloth  6 each Topical Daily  . feeding supplement (ENSURE ENLIVE)  237 mL Oral TID BM  . heparin injection (subcutaneous)  5,000 Units Subcutaneous Q8H  . mouth rinse  15 mL Mouth Rinse q12n4p  . pantoprazole  40 mg Oral Daily  . Warfarin - Physician Dosing Inpatient   Does not apply q1800    Infusions: . sodium chloride Stopped (04/30/17 0800)  . sodium chloride    . DOBUTamine 6 mcg/kg/min (05/02/17 0600)  . EPINEPHrine 4 mg in dextrose 5% 250 mL infusion (16 mcg/mL) 4 mcg/min (05/02/17 0600)  . meropenem (MERREM) IV Stopped (05/02/17 0459)  . norepinephrine (LEVOPHED) Adult infusion Stopped (05/01/17 2316)  . dialysis replacement fluid (prismasate) 400 mL/hr at 05/01/17 2308  . dialysis replacement fluid (prismasate) 200 mL/hr at 05/01/17 2352  . dialysate (PRISMASATE) 1,700 mL/hr at 05/02/17 0252  . sodium chloride      PRN Medications: heparin, levalbuterol, morphine injection, ondansetron (ZOFRAN) IV, oxyCODONE, simethicone, sodium chloride, sodium chloride flush, sodium chloride flush   Patient Profile   David [name] is a  52 y.o. male with a PMH of ICM ('09 PCI to LAD, mRCA, '10 PCI to  Lcx and '12 mRCA), HFrEF (EF ~20% for >5 years) s/p HM-III VAD placement on 12/27 due to end-stage systolic HF.   Assessment/Plan:    1. Acute on chronic systolic HF: Echo 99/37/16 with LVEF 20-25%, Mild MR, Mild/Mod MR, Severe LAE, Mildly reduced RV, PA peak pressure 56 mm Hg.  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 Extubated, initially stopped epinephrine/norepinephrine but restarted with fall in UOP and MAP.  With progressive renal failure, pulmonary edema, and hypotension, he was re-intubated on 12/30.   CVVH was begun.  12/31 was difficult day with refractory shock (MAP in 60s).  Reviewed 12/31 echo which showed no significant pericardial effusion, adequate RV function, and IV septum near mid-line. Given suspected for vasodilatory/septic shock with PCT 31 04/24/17, he is covered with cefepime.   - Off NE. Start to wean Epi but will slowly wean. Need to keep Maps >80. Dobutamine at 6 mcg. CO-OX 54%.  - Continue CVVH running pulling 50cc  - Cotninue aspirin 81 mg daily.  - Warfarin goal INR 2-2.5. INR 1.7. On subcutaneous heparin. CT surgery dosing coumadin.  - LDH  434 2. CAD s/p PCI to mid RCA and PLOM with DES x 2 and DES to mid LAD in 1/18.   - No s/s ischemia.  - Continue ASA and statin. No change.   3. PAF: Remains in NSR.  4. ARF on CKD stage III: Suspect some degree of intrinsic renal disease prior to LVAD placement (baseline creatinine around 1.5) with probable ATN from intra-op/peri-op hypotension and development of vasodilatory/septic shock.   - Remains on CVVHD. Keeping even for now.  - - Appreciate Renal in put.  5. ABLA, expected:   - Hgb 8.5 this am s/p 1 u PRBCs 12/30 and 04/25/17.  - Hgb 7.6.. No obvious source.  Receiving 1UPRBCs this morning. . 6. ID: Started on cefepime empirically with septic/vasodilatory shock, PCT was 31 04/25/17 Cultures (blood, sputum) NGTD   - Improving. PCT now coming down. WBC stable at 21k - ABX broadened to  Meropenem 04/27/16 with + Achromobacter on tracheal aspirate.  - PCT 8.31 04/27/17 -> 3.87  7. Elevated LFTs due to shock liver - Continues to improve.   Length of Stay: [place], NP 05/02/2017, 8:07 AM  VAD Team --- VAD ISSUES ONLY--- Pager [phone] (7am - 7am)  Advanced Heart Failure Team  Pager [phone] (M-F; 7a - 4p)  Please contact [facility] for night-coverage after hours (4p -7a ) and weekends on [email or web address]   Patient seen and examined with Darrick Grinder, NP. We discussed all aspects of the encounter. I agree with the assessment and plan as stated above.   Making slow progress. NE weaned off. MAPs ok but co-ox remains marginal at 54%. Getting 1u RBCs today. Remains anuric. Continues on CVVHD. Bilirubin and PCT coming down. Albumin remains very low. VAD interrogated personally. Parameters stable. INR 1.7/ LDH 434. Remains on warfarin D/w PharmD.   Continue to mobilize. Wean pressors slowly. Liver function improving. Hopefully renal function will return. Continue CVVHD for now. Rhythm stable.   Glori Bickers, MD  10:57 AM

## 2017-05-02 NOTE — Progress Notes (Signed)
Presented to patients room today to observe staff nurse Cel[place] performed drive line exit wound care. Nurse pre-medicated patient with IV MS prior to dressing change due to patient's intolerance of dressing removal due to pain.   Existing VAD dressing removed and site care performed using sterile technique. Drive line exit site cleaned with Chlora prep applicators x 2, allowed to dry, and aqaucell silver strip with daily dressing kit re-applied. Exit site is red with small amount serosangenous drainage; one suture remains intact. The velour is not yet incorporated. No tenderness, drainage, foul odor or rash noted. Drive line anchor x 2 secure.       Tanda Rockers RN, VAD Coordinator 24/7 VAD pager:  [phone]

## 2017-05-02 NOTE — Progress Notes (Signed)
Occupational Therapy Treatment Patient Details Name: David [name] MRN: [id number] DOB: December 28, 1965 Today's Date: 05/02/2017    History of present illness This 52 y.o. male admitted for LVAD. Pt extubated 12/28.   on 12/30, pt developed respiratory distress with pulmonary edema, and increased creatanine.  He was intubated 12/30 > 04/26/16, and CRRT initiated 12/30>.    PMH includes:  Acute on chronic systolic HF, CAD, PAF, CKD stage III, LBBB, frequent PVCs, seizures, PNS, MI, ischemic cardiomyopathy, s/p AICD    OT comments  Pt transferred to chair with min A +2.  Performed exercises in chair, fatigues quickly.   Agree that pt may need CIR prior to discharge home as he has become deconditioned.  Will continue to follow.   Follow Up Recommendations  CIR;Supervision/Assistance - 24 hour    Equipment Recommendations  Tub/shower seat    Recommendations for Other Services Rehab consult    Precautions / Restrictions Precautions Precautions: Fall;Sternal;Other (comment) Precaution Comments: requires min cues for sternal precautions; pt on CRRT       Mobility Bed Mobility Overal bed mobility: Needs Assistance Bed Mobility: Supine to Sit     Supine to sit: Min assist;+2 for safety/equipment     General bed mobility comments: assist to lift shoulders   Transfers Overall transfer level: Needs assistance Equipment used: 2 person hand held assist Transfers: Sit to/from Stand;Stand Pivot Transfers Sit to Stand: Min assist;+2 safety/equipment Stand pivot transfers: Min assist;+2 safety/equipment       General transfer comment: assists to steady     Balance Overall balance assessment: Needs assistance Sitting-balance support: Feet supported Sitting balance-Leahy Scale: Fair     Standing balance support: Single extremity supported Standing balance-Leahy Scale: Poor Standing balance comment: Hand held assist for static standing                           ADL either  performed or assessed with clinical judgement   ADL                                               Vision       Perception     Praxis      Cognition Arousal/Alertness: Awake/alert Behavior During Therapy: WFL for tasks assessed/performed Overall Cognitive Status: Within Functional Limits for tasks assessed(for basic info/tasks )                                          Exercises Exercises: General Upper Extremity General Exercises - Upper Extremity Shoulder Flexion: AROM;Strengthening;Right;Left;10 reps(5 second holds ) General Exercises - Lower Extremity Long Arc Quad: AROM;Strengthening;Both;10 reps;Seated   Shoulder Instructions       General Comments VSS    Pertinent Vitals/ Pain       Pain Assessment: Faces Faces Pain Scale: Hurts little more Pain Location: generalized  Pain Descriptors / Indicators: Grimacing Pain Intervention(s): Monitored during session  Home Living                                          Prior Functioning/Environment  Frequency  Min 2X/week        Progress Toward Goals  OT Goals(current goals can now be found in the care plan section)  Progress towards OT goals: Progressing toward goals(slowly )     Plan Discharge plan needs to be updated    Co-evaluation                 AM-PAC PT "6 Clicks" Daily Activity     Outcome Measure   Help from another person eating meals?: None Help from another person taking care of personal grooming?: A Little Help from another person toileting, which includes using toliet, bedpan, or urinal?: A Lot Help from another person bathing (including washing, rinsing, drying)?: A Lot Help from another person to put on and taking off regular upper body clothing?: A Lot Help from another person to put on and taking off regular lower body clothing?: A Lot 6 Click Score: 15    End of Session Equipment Utilized During  Treatment: Oxygen  OT Visit Diagnosis: Unsteadiness on feet (R26.81);Muscle weakness (generalized) (M62.81)   Activity Tolerance Patient limited by fatigue;Treatment limited secondary to medical complications (Comment)   Patient Left in bed;with call bell/phone within reach   Nurse Communication Mobility status        Time: 9432-7614 OT Time Calculation (min): 23 min  Charges: OT General Charges $OT Visit: 1 Visit OT Treatments $Therapeutic Activity: 23-37 mins  [facility], OTR/L [phone]    [name] M 05/02/2017, 4:17 PM

## 2017-05-02 NOTE — Progress Notes (Signed)
ANTICOAGULATION CONSULT NOTE - Follow Up Consult  Pharmacy Consult for Coumadin Indication: LVAD  Allergies  Allergen Reactions  . Plavix [Clopidogrel Bisulfate] Hives    Patient Measurements: Height: 5\' 5"  (165.1 cm) Weight: 157 lb 13.6 oz (71.6 kg) IBW/kg (Calculated) : 61.5 Heparin Dosing Weight: n/a  Vital Signs: Temp: 97.9 F (36.6 C) (01/08 1115) Temp Source: Oral (01/08 1115) BP: 94/70 (01/08 1200) Pulse Rate: 30 (01/08 1300)  Labs: Recent Labs    04/30/17 0525  05/01/17 0408 05/01/17 1525 05/02/17 0335  HGB 8.5*  --   --   --  7.6*  HCT 26.1*  --   --   --  23.9*  PLT 265  --   --   --   --   LABPROT 25.9*  --  24.5*  --  19.8*  INR 2.39  --  2.23  --  1.70  CREATININE 1.67*   < > 1.93*  1.88* 2.18* 1.93*   < > = values in this interval not displayed.    Estimated Creatinine Clearance: 38.5 mL/min (A) (by C-G formula based on SCr of 1.93 mg/dL (H)).   Assessment: 52 yo male s/p LVAD implantation 12/27.  Initiated on warfarin 12/28, then stopped after 2 doses due to shock liver/rising INR. INR back in goal range 1/6 and coumadin resumed per pharmacy dosing.  INR has trended down with 1mg  doses and now below goal at 1.7. Hgb down to 7.6 but likely due to blood draws/loss from CRRT filter per Dr. Cyndia Bent as no other signs of bleeding - received 1 unit PRBCs. LFTs continue to trend down, LDH stable 434.   Patient continues on SQ heparin for px as true anticoagulation state unclear with liver dysfunction. Will follow plan/length of therapy of this carefully.   Goal of Therapy:  INR 2-2.5 Monitor platelets by anticoagulation protocol: Yes   Plan:  1) Coumadin 2mg  tonight 2) Daily INR  05/02/2017 1:24 PM

## 2017-05-02 NOTE — Progress Notes (Signed)
Patient ID: David [name], male   DOB: 27-Oct-1965, 52 y.o.   MRN: [id number] HeartMate 3 Rounding Note  Subjective:    No specific complaints. Had bowel movement this am. Eating some Remains on dobutamine 6, epi 3. Levophed off Co-ox 54 this am but Hgb down to 7.6. Transfused one unit PRBC's CVP 14  Remains on CRRT with -50 cc/hr fluid balance   LVAD INTERROGATION:  HeartMate IIl LVAD:  Flow 5.0 liters/min, speed 5300, power 4, PI 3.1.  No PI events  Objective:    Vital Signs:   Temp:  [97.7 F (36.5 C)-99.5 F (37.5 C)] 97.7 F (36.5 C) (01/08 0926) Pulse Rate:  [31-134] 49 (01/08 1000) Resp:  [17-31] 26 (01/08 1000) BP: (81-102)/(44-89) 88/70 (01/08 1000) SpO2:  [90 %-100 %] 90 % (01/08 1000) Weight:  [71.6 kg (157 lb 13.6 oz)] 71.6 kg (157 lb 13.6 oz) (01/08 0500) Last BM Date: 05/02/17 Mean arterial Pressure 78  Intake/Output:   Intake/Output Summary (Last 24 hours) at 05/02/2017 1111 Last data filed at 05/02/2017 1000 Gross per 24 hour  Intake 1776.36 ml  Output 1831 ml  Net -54.64 ml     Physical Exam: General:  Chronically ill-appearing but awake and alert. Up in chair since 6 am. No resp difficulty HEENT: abrasions on nose from tape healing Neck: left IJ central line Cor: Normal heart sounds with LVAD hum present. Lungs: clear Abdomen: soft, nontender, nondistended. Good bowel sounds. Extremities: minimal edema in arms Neuro: alert & orientedx3,  moves all 4 extremities w/o difficulty. Affect pleasant  Telemetry: sinus rhythm  Labs: Basic Metabolic Panel: Recent Labs  Lab 04/28/17 0400  04/29/17 0353 04/29/17 2030 04/30/17 0525 04/30/17 1617 05/01/17 0408 05/01/17 1525 05/02/17 0335  NA 136   < > 135  135 129* 129* 130* 132*  132* 131* 129*  K 4.0   < > 4.1  4.0 4.3 4.2 4.5 4.4  4.4 4.6 4.4  CL 103   < > 100*  101 97* 95* 98* 98*  98* 99* 97*  CO2 26   < > 27  26 25 26 25 27  27 26 26   GLUCOSE 96   < > 100*  101* 98 109* 104* 104*   102* 104* 94  BUN 13   < > 13  12 10 11 9 10  10 13 10   CREATININE 1.69*   < > 1.68*  1.72* 1.65* 1.67* 1.76* 1.93*  1.88* 2.18* 1.93*  CALCIUM 8.0*   < > 7.8*  8.3* 7.6* 7.9* 7.9* 7.9*  7.9* 7.8* 7.8*  MG 2.6*  --  2.3  --  2.4  --  2.4  --  2.3  PHOS 2.3*   < > 2.8 2.4*  --  2.2* 2.2* 2.5 2.2*   < > = values in this interval not displayed.    Liver Function Tests: Recent Labs  Lab 04/28/17 0400  04/29/17 0353  04/30/17 0525 04/30/17 1617 05/01/17 0408 05/01/17 1525 05/02/17 0335  AST 119*  --  99*  --  109*  --  96*  --  92*  ALT 112*  --  92*  --  88*  --  74*  --  66*  ALKPHOS 209*  --  253*  --  397*  --  423*  --  458*  BILITOT 10.7*  --  9.2*  --  8.5*  --  6.1*  --  5.2*  PROT 6.5  --  6.5  --  6.7  --  6.8  --  6.4*  ALBUMIN 1.8*   < > 1.8*  1.8*   < > 1.7* 1.8* 1.8*  1.7* 1.7* 1.6*   < > = values in this interval not displayed.   No results for input(s): LIPASE, AMYLASE in the last 168 hours. No results for input(s): AMMONIA in the last 168 hours.  CBC: Recent Labs  Lab 04/26/17 0343 04/27/17 0349 04/28/17 0400 04/30/17 0525 05/02/17 0335  WBC 13.5* 19.0* 21.4* 21.8*  --   NEUTROABS 11.2* 16.6*  --   --   --   HGB 8.9* 9.2* 8.9* 8.5* 7.6*  HCT 26.9* 28.7* 27.7* 26.1* 23.9*  MCV 78.4 77.2* 77.8* 78.1  --   PLT 114* 133* 181 265  --     INR: Recent Labs  Lab 04/28/17 0400 04/29/17 0353 04/30/17 0525 05/01/17 0408 05/02/17 0335  INR 3.80 3.16 2.39 2.23 1.70    Other results:  EKG:   Imaging: No results found.   Medications:     Scheduled Medications: . aspirin EC  81 mg Oral Daily  . chlorhexidine  15 mL Mouth Rinse BID  . Chlorhexidine Gluconate Cloth  6 each Topical Daily  . feeding supplement (ENSURE ENLIVE)  237 mL Oral TID BM  . heparin injection (subcutaneous)  5,000 Units Subcutaneous Q8H  . mouth rinse  15 mL Mouth Rinse q12n4p  . pantoprazole  40 mg Oral Daily  . Warfarin - Physician Dosing Inpatient   Does not apply  q1800    Infusions: . sodium chloride Stopped (04/30/17 0800)  . sodium chloride    . DOBUTamine 6 mcg/kg/min (05/02/17 0800)  . EPINEPHrine 4 mg in dextrose 5% 250 mL infusion (16 mcg/mL) 3 mcg/min (05/02/17 0915)  . meropenem (MERREM) IV Stopped (05/02/17 0459)  . norepinephrine (LEVOPHED) Adult infusion Stopped (05/01/17 2316)  . dialysis replacement fluid (prismasate) 400 mL/hr at 05/01/17 2308  . dialysis replacement fluid (prismasate) 200 mL/hr at 05/01/17 2352  . dialysate (PRISMASATE) 1,700 mL/hr at 05/02/17 0852  . sodium chloride      PRN Medications: heparin, levalbuterol, morphine injection, ondansetron (ZOFRAN) IV, oxyCODONE, simethicone, sodium chloride, sodium chloride flush, sodium chloride flush   Assessment:   1. Ischemic cardiomyopathy s/p multiple PCI's with chronic systolic heart failure and EF 20% on home dobutamine preop with significant improvement in symptoms. Evaluated at [facility] for heart transplant but LVAD felt to be the best treatment for him at this time. POD13s/p HeartMate III LVAD. He is critically ill with multi-system organ failure but improving.Hemodynamics stable on current pressors.  I think a MAP in the 70-80 range is probably a good goal at this point.  2.H/OHypertension 3.H/OHyperlipidemia 4. PAF in sinuspreopon amiodarone and Eliquis.Now back in sinus off amiodarone.  5.H/OStage III CKD. Acute on chronic postop renal failurewith anuria now on CRRT.  6. LBBB s/p CRT-D upgrade in 10/2016 without improvement in symptoms or EF. LV epicardial leads cut off at the time of surgery. 7. Prior smoking with mild obstruction and severe diffusion defect on PFT's. CXRhas been stable. Continue IS. 8. Postop leukocytosis and fever. WBC pending this am. Sputum culture grew a few achromobacter resistant to Maxipime so broadened to meropenem. WBC trending up a little but afebrile. Central line is new. Dialysis catheter placed a week ago. 9. Marked  rise in bilirubin and transaminases. Likely due to pump runand right sided congestion, hypotension.Bili and transaminases improving. 10. INR is1.7 today. Coumadin per pharmacy.  11. Taking PO fairly well. Continue to encourage nutrition.    12. PT    13. Anemia: drop in Hgb most likely due to blood draws and loss in CRRT filters. No visible blood loss otherwise. Repeat CBC after transfusion today.     Plan/Discussion:    Overall he has been stable. I would accept MAP in the 70-80 range and wean epi slowly as tolerated. His Co-ox is up and down and will be mostly dependent on PO2 at this point which is going to be on the low side with his lung disease. Transfusion should help this. VAD flow has been great. I think he is euvolemic with CVP 14 and no peripheral edema so could keep I/O even to slightly negative.   I reviewed the LVAD parameters from today, and compared the results to the patient's prior recorded data.  No programming changes were made.  The LVAD is functioning within specified parameters.   LVAD interrogation was negative for any significant power changes, alarms or PI events/speed drops.  LVAD equipment check completed and is in good working order.  Back-up equipment present.   LVAD education done on emergency procedures and precautions and reviewed exit site care.  Length of Stay: [id number]  Fernande Boyden [facility] 05/02/2017, 11:11 AM

## 2017-05-02 NOTE — Progress Notes (Signed)
LVAD Coordinator Rounding Note:  Admitted12/26due to decompensated heart failure.  Heartmate IIILVAD [facility].AV valve oversewn due to aortic insufficiency.  Vital signs: Tmax:98.1 HR:94 Doppler Pressure:88 Automatic cuff:  102/86 (93) O2 Sat:97% on 2LNC Wt:165>164>156>160>160>154>158>156>157lbs  LVAD interrogation reveals:  Speed:5300 Flow: 4.4 Power: 3.8w PI:4.6 Alarms:none Events:no PI events over last 24 hrs Hematocrit:24 Fixed speed:5300 Low speed limit:5000  Labs:  LDH trend:205>407>576>923>967>612>528>492>467>489>441>434  INR trend:1.34>1.52>1.73>1.95>2.32>3.03>3.80>3.16>2.39>2.23>1.70  Anticoagulation Plan: -INR Goal:2-2.5  -ASA Dose:decreased to 81 mg (05/01/17)  Blood Products: Intra op:2 units/FFP 04/20/17  Post op: PRBC's 1 unit 12/29 1 unit 12/30 1 unit 04/25/17  Device:Medtronic BiV -Therapies:off  Arrhythmia: 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 (on amiodarone and Eliquis for this) - Afib 12/29 - 04/28/17 - NSR 1/5 - present  Nitric Oxide: -  stopped on 12/30 due to initiation of BiPap  Respiratory: - Extubated 12/28 - Reintubated 04/23/2017 - Extubated 04/26/2017  Renal:  04/23/17 - CRRT started for AKI  Infection: - 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 VAD coordinator to change VAD dressing today with weekend nurse Celenia. 2. Please call VAD Pager for any equipment concerns or drive line dressing questions/concerns.   Tanda Rockers RN, VAD Coordinator 24/7 pager [phone]

## 2017-05-02 NOTE — Progress Notes (Signed)
  Speech Language Pathology Treatment: Dysphagia  Patient Details Name: Seibert Keeter MRN: [id number] DOB: Nov 18, 1965 Today's Date: 05/02/2017 Time: 2162-4469 SLP Time Calculation (min) (ACUTE ONLY): 8 min  Assessment / Plan / Recommendation Clinical Impression  Pt followed up after bedside swallow assessment. Diet had been upgraded from Dys 1 (puree) to Dys 3 (mech soft) and pt ordering what he is able to masticate (recent full dental extraction). Immediate and delayed throat clears that are present today during clinical observation and during FEES (without penetration/aspiration observed). Will continue Dys 3 texture due to recent endentulous status. No further ST needed.    HPI HPI: Elad Macphail a 52 y.o.malewith a PMH of ICM (Ischemic Cardiomyopathy) HLD, HTN, Afib/AFL,  Brady/PEA arrest  04/29/16, cath on 05/05/16 with 3V CAD, stent placement, left thoracotomy and insertion of two LV epicardial leads and revision of his CRT system to BiV on 7/16/201, dental extraction week of 04/05/17. Admitted 12/26 for  VAD. Intubated 12/27 for procedure, extubated 12/28, reintubated 12/30 due to decreased responsiveness and extubated 1/2 (12:30). CXR Cardiomegaly with persistent mild bilateral interstitial prominence and small pleural effusions suggesting mild CHF. Slight worsening from prior exam.      SLP Plan  All goals met;Discharge SLP treatment due to (comment)       Recommendations  Diet recommendations: Dysphagia 3 (mechanical soft);Thin liquid Liquids provided via: Cup;Straw Medication Administration: Whole meds with liquid Supervision: Patient able to self feed Compensations: Slow rate;Small sips/bites Postural Changes and/or Swallow Maneuvers: Seated upright 90 degrees;Upright 30-60 min after meal                Oral Care Recommendations: Oral care BID Follow up Recommendations: None SLP Visit Diagnosis: Dysphagia, pharyngeal phase (R13.13) Plan: All goals met;Discharge SLP  treatment due to (comment)       GO                Houston Siren 05/02/2017, 2:48 PM   Orbie Pyo Colvin Caroli.[employer] [phone]

## 2017-05-02 NOTE — Progress Notes (Signed)
89M with sCHF and ICM s/p HM3 LVAD 08/65 complicated by shock on pressors, respoiratory failure    Patient is on CRRT and appears to be tolerating fine - discussed management this morning with Dr Cyndia Bent    Start Date: 04/23/17 Catheter: R Asher Temp HD cath BFR: 200 4K Pre Blood Pump: 400 4K DFR: 1700 4K  Replacement Rate: 200 4K Goal UF: 0-81mL hr net neg Anticoagulation: no heparin in circuit; on warfarin    Noted increase in wbc but no fever  Now wbc increased to 20K     Objective:  Vital signs in last 24 hours:  Temp:  [97.4 F (36.3 C)-98.4 F (36.9 C)] 97.9 F (36.6 C) (01/07 0746) Pulse Rate:  [30-106] 35 (01/07 0900) Resp:  [19-37] 24 (01/07 0900) BP: (83-112)/(54-87) 97/75 (01/07 0800) SpO2:  [77 %-100 %] 77 % (01/07 0900) Weight:  [156 lb 4.9 oz (70.9 kg)] 156 lb 4.9 oz (70.9 kg) (01/07 0500)  Weight change: -15.7 oz (-0.9 kg)      Filed Weights   04/29/17 0433 04/30/17 0500 05/01/17 0500  Weight: 154 lb 15.7 oz (70.3 kg) 158 lb 4.6 oz (71.8 kg) 156 lb 4.9 oz (70.9 kg)    Intake/Output: I/O last 3 completed shifts: In: 2950.8 [P.O.:1580; I.V.:1070.8; IV Piggyback:300] Out: 5002 [Urine:200; Other:4802]   Intake/Output this shift:  Total I/O In: 172.4 [P.O.:120; I.V.:52.4] Out: 200 [Urine:50; Other:150]  CVS- RRR  Mechanical sound of LVAD  RS- CTA ABD- BS present soft non-distended EXT- no edema   Basic Metabolic Panel: LastLabs             Recent Labs  Lab 04/27/17 0349  04/28/17 0400 04/28/17 1630 04/29/17 0353 04/29/17 2030 04/30/17 0525 04/30/17 1617 05/01/17 0408  NA 136   < > 136 135 135  135 129* 129* 130* 132*  132*  K 4.0   < > 4.0 4.0 4.1  4.0 4.3 4.2 4.5 4.4  4.4  CL 102   < > 103 101 100*  101 97* 95* 98* 98*  98*  CO2 24   < > 26 26 27  26 25 26 25 27  27   GLUCOSE 80   < > 96 124* 100*  101* 98 109* 104* 104*  102*  BUN 16   < > 13 12 13  12 10 11 9 10  10   CREATININE 1.64*   < > 1.69* 1.61*  1.68*  1.72* 1.65* 1.67* 1.76* 1.93*  1.88*  CALCIUM 8.1*   < > 8.0* 8.0* 7.8*  8.3* 7.6* 7.9* 7.9* 7.9*  7.9*  MG 2.6*  --  2.6*  --  2.3  --  2.4  --  2.4  PHOS 2.2*   < > 2.3* 1.9* 2.8 2.4*  --  2.2* 2.2*   < > = values in this interval not displayed.      Liver Function Tests: LastLabs              Recent Labs  Lab 04/27/17 0349 04/27/17 1050  04/28/17 0400  04/29/17 0353 04/29/17 2030 04/30/17 0525 04/30/17 1617 05/01/17 0408  AST 149*  --   --  119*  --  99*  --  109*  --  96*  ALT 144*  --   --  112*  --  92*  --  88*  --  74*  ALKPHOS 191*  --   --  209*  --  253*  --  397*  --  423*  BILITOT 10.7* 10.8*  --  10.7*  --  9.2*  --  8.5*  --  6.1*  PROT 6.5  --   --  6.5  --  6.5  --  6.7  --  6.8  ALBUMIN 1.9*  --    < > 1.8*   < > 1.8*  1.8* 1.8* 1.7* 1.8* 1.8*  1.7*   < > = values in this interval not displayed.     LastLabs  No results for input(s): LIPASE, AMYLASE in the last 168 hours.   LastLabs  No results for input(s): AMMONIA in the last 168 hours.    CBC: LastLabs         Recent Labs  Lab 04/25/17 0359 04/26/17 0343 04/27/17 0349 04/28/17 0400 04/30/17 0525  WBC 14.3* 13.5* 19.0* 21.4* 21.8*  NEUTROABS 11.8* 11.2* 16.6*  --   --   HGB 8.1* 8.9* 9.2* 8.9* 8.5*  HCT 25.1* 26.9* 28.7* 27.7* 26.1*  MCV 77.5* 78.4 77.2* 77.8* 78.1  PLT 118* 114* 133* 181 265      Cardiac Enzymes: LastLabs  No results for input(s): CKTOTAL, CKMB, CKMBINDEX, TROPONINI in the last 168 hours.    BNP: LastLabs  Invalid input(s): POCBNP    CBG: LastLabs         Recent Labs  Lab 04/26/17 1643 04/26/17 1951 04/26/17 2349 04/27/17 0406 04/27/17 0754  GLUCAP 78 82 104* 57 75      Microbiology:        Results for orders placed or performed during the hospital encounter of 04/19/17  Surgical pcr screen     Status: None   Collection Time: 04/19/17  1:58 PM  Result Value Ref Range Status   MRSA, PCR NEGATIVE NEGATIVE  Final   Staphylococcus aureus NEGATIVE NEGATIVE Final    Comment: (NOTE) The Xpert SA Assay (FDA approved for NASAL specimens in patients 29 years of age and older), is one component of a comprehensive surveillance program. It is not intended to diagnose infection nor to guide or monitor treatment.   Culture, blood (routine x 2)     Status: None   Collection Time: 04/22/17 11:25 AM  Result Value Ref Range Status   Specimen Description BLOOD RIGHT ARM  Final   Special Requests IN PEDIATRIC BOTTLE Blood Culture adequate volume  Final   Culture NO GROWTH 5 DAYS  Final   Report Status 04/27/2017 FINAL  Final  Culture, blood (routine x 2)     Status: None   Collection Time: 04/22/17 11:36 AM  Result Value Ref Range Status   Specimen Description BLOOD RIGHT HAND  Final   Special Requests IN PEDIATRIC BOTTLE Blood Culture adequate volume  Final   Culture NO GROWTH 5 DAYS  Final   Report Status 04/27/2017 FINAL  Final  Culture, respiratory (NON-Expectorated)     Status: None   Collection Time: 04/24/17  8:16 AM  Result Value Ref Range Status   Specimen Description TRACHEAL ASPIRATE  Final   Special Requests NONE  Final   Gram Stain   Final    MODERATE WBC PRESENT,BOTH PMN AND MONONUCLEAR NO ORGANISMS SEEN    Culture FEW ACHROMOBACTER SPECIES  Final   Report Status 04/27/2017 FINAL  Final   Organism ID, Bacteria ACHROMOBACTER SPECIES  Final      Susceptibility   Achromobacter species - MIC*    CEFEPIME >=64 RESISTANT Resistant     CEFAZOLIN >=64 RESISTANT Resistant     GENTAMICIN 4  SENSITIVE Sensitive     CIPROFLOXACIN 2 INTERMEDIATE Intermediate     IMIPENEM 1 SENSITIVE Sensitive     TRIMETH/SULFA <=20 SENSITIVE Sensitive     * FEW ACHROMOBACTER SPECIES  Urine Culture     Status: None   Collection Time: 04/25/17 12:38 PM  Result Value Ref Range Status   Specimen Description URINE, CATHETERIZED  Final   Special  Requests Normal  Final   Culture NO GROWTH  Final   Report Status 04/26/2017 FINAL  Final    Coagulation Studies: RecentLabs(last2labs)       Recent Labs    04/29/17 0353 04/30/17 0525 05/01/17 0408  LABPROT 32.2* 25.9* 24.5*  INR 3.16 2.39 2.23      Urinalysis:  RecentLabs(last2labs)  No results for input(s): COLORURINE, LABSPEC, PHURINE, GLUCOSEU, HGBUR, BILIRUBINUR, KETONESUR, PROTEINUR, UROBILINOGEN, NITRITE, LEUKOCYTESUR in the last 72 hours.  Invalid input(s): APPERANCEUR      Imaging: ImagingResults(Last48hours)  No results found.     Medications:   . sodium chloride Stopped (04/30/17 0800)  . DOBUTamine 6 mcg/kg/min (05/01/17 0700)  . EPINEPHrine 4 mg in dextrose 5% 250 mL infusion (16 mcg/mL) 4 mcg/min (05/01/17 0700)  . meropenem (MERREM) IV Stopped (05/01/17 0458)  . norepinephrine (LEVOPHED) Adult infusion 4 mcg/min (05/01/17 0859)  . dialysis replacement fluid (prismasate) 400 mL/hr at 04/30/17 2116  . dialysis replacement fluid (prismasate) 200 mL/hr at 04/30/17 2215  . dialysate (PRISMASATE) 1,700 mL/hr at 05/01/17 0510  . sodium chloride     . aspirin EC  81 mg Oral Daily  . chlorhexidine  15 mL Mouth Rinse BID  . Chlorhexidine Gluconate Cloth  6 each Topical Daily  . feeding supplement (ENSURE ENLIVE)  237 mL Oral TID BM  . heparin injection (subcutaneous)  5,000 Units Subcutaneous Q8H  . mouth rinse  15 mL Mouth Rinse q12n4p  . pantoprazole  40 mg Oral Daily  . warfarin  1 mg Oral ONCE-1800  . Warfarin - Physician Dosing Inpatient   Does not apply q1800   heparin, levalbuterol, morphine injection, ondansetron (ZOFRAN) IV, oxyCODONE, sodium chloride, sodium chloride flush, sodium chloride flush  Assessment/ Plan:  1. 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 Pigment nephropathy  2. ICM s/p LVAD 12/27; on dobutamine 3. Shock on NE/Epi per AHF + TCTS weaning  pressors 4. VDRF, now extubated 5. Hypophosphatemia, repleted 1/3;monitoring, now on diet might stablized w/o further IV phos 6. Elevated bilirubin; elevated transaminates, improving 7. Leukocytosis, +ancrhomobacter on trach aspirate 12/31 on meropenem; 12/31 B Cx x2 NGF 8. Anemia transfusing per AHF  P 1. Cont CRRT at current settings.No heparin as running well for past 24h 2. UOP is positive, follow today 3. Not ready to transition to iHD; no evidence of GFR recovery   Seen on CRRT   --  Discussed with Dr Cyndia Bent today and will aim to pull a little fluid 25 - 50 cc/hour   LOS: 12 Rolland Steinert W @TODAY @9 :02 AM

## 2017-05-02 NOTE — Progress Notes (Signed)
Inpatient Rehabilitation  Per PT request, patient was screened by Gunnar Fusi for appropriateness for an Inpatient Acute Rehab consult.  At this time would recommend an OT order to better determine if CIR is the most appropriate post acute rehab, as it would be needed to gain insurance authorization from [employer].  Will follow at a distance for potential needs.  Call if questions.  Carmelia Roller., CCC/SLP Admission Coordinator  [place]  Cell [phone]

## 2017-05-02 NOTE — Progress Notes (Signed)
Patient ID: Franchot Pollitt, male   DOB: Jul 01, 1965, 52 y.o.   MRN: [id number]  Hemodynamically stable today. Epi weaned off. On dobutamine 6.   In good spirits up in chair talking with wife.  Ate some but says it upset his stomach. Still drinking Ensure.  VAD parameters stable.  CBC    Component Value Date/Time   WBC 22.6 (H) 05/02/2017 1321   RBC 3.56 (L) 05/02/2017 1321   HGB 9.3 (L) 05/02/2017 1321   HCT 28.7 (L) 05/02/2017 1321   PLT 321 05/02/2017 1321   MCV 80.6 05/02/2017 1321   MCH 26.1 05/02/2017 1321   MCHC 32.4 05/02/2017 1321   RDW 21.7 (H) 05/02/2017 1321   LYMPHSABS 0.8 04/27/2017 0349   MONOABS 1.6 (H) 04/27/2017 0349   EOSABS 0.0 04/27/2017 0349   BASOSABS 0.0 04/27/2017 0349   Hgb improved after transfusion.

## 2017-05-03 LAB — CBC
HCT: 27.6 % — ABNORMAL LOW (ref 39.0–52.0)
Hemoglobin: 8.7 g/dL — ABNORMAL LOW (ref 13.0–17.0)
MCH: 25.4 pg — ABNORMAL LOW (ref 26.0–34.0)
MCHC: 31.5 g/dL (ref 30.0–36.0)
MCV: 80.7 fL (ref 78.0–100.0)
PLATELETS: 336 10*3/uL (ref 150–400)
RBC: 3.42 MIL/uL — ABNORMAL LOW (ref 4.22–5.81)
RDW: 21.5 % — AB (ref 11.5–15.5)
WBC: 20 10*3/uL — AB (ref 4.0–10.5)

## 2017-05-03 LAB — PROTIME-INR
INR: 1.65
Prothrombin Time: 19.4 seconds — ABNORMAL HIGH (ref 11.4–15.2)

## 2017-05-03 LAB — RENAL FUNCTION PANEL
ANION GAP: 5 (ref 5–15)
Albumin: 1.7 g/dL — ABNORMAL LOW (ref 3.5–5.0)
Albumin: 1.7 g/dL — ABNORMAL LOW (ref 3.5–5.0)
Anion gap: 7 (ref 5–15)
BUN: 11 mg/dL (ref 6–20)
BUN: 7 mg/dL (ref 6–20)
CALCIUM: 8 mg/dL — AB (ref 8.9–10.3)
CO2: 27 mmol/L (ref 22–32)
CO2: 26 mmol/L (ref 22–32)
CREATININE: 1.82 mg/dL — AB (ref 0.61–1.24)
Calcium: 7.8 mg/dL — ABNORMAL LOW (ref 8.9–10.3)
Chloride: 99 mmol/L — ABNORMAL LOW (ref 101–111)
Chloride: 99 mmol/L — ABNORMAL LOW (ref 101–111)
Creatinine, Ser: 1.84 mg/dL — ABNORMAL HIGH (ref 0.61–1.24)
GFR calc Af Amer: 47 mL/min — ABNORMAL LOW (ref >60)
GFR, EST AFRICAN AMERICAN: 47 mL/min — AB (ref >60)
GFR, EST NON AFRICAN AMERICAN: 40 mL/min — AB (ref >60)
GFR, EST NON AFRICAN AMERICAN: 41 mL/min — AB (ref >60)
GLUCOSE: 102 mg/dL — AB (ref 65–99)
Glucose, Bld: 78 mg/dL (ref 65–99)
PHOSPHORUS: 3.1 mg/dL (ref 2.5–4.6)
Phosphorus: 2.1 mg/dL — ABNORMAL LOW (ref 2.5–4.6)
Potassium: 4.3 mmol/L (ref 3.5–5.1)
Potassium: 4.1 mmol/L (ref 3.5–5.1)
SODIUM: 131 mmol/L — AB (ref 135–145)
SODIUM: 132 mmol/L — AB (ref 135–145)

## 2017-05-03 LAB — TYPE AND SCREEN
ABO/RH(D): O POS
ANTIBODY SCREEN: NEGATIVE
UNIT DIVISION: 0

## 2017-05-03 LAB — MAGNESIUM: MAGNESIUM: 2.3 mg/dL (ref 1.7–2.4)

## 2017-05-03 LAB — BPAM RBC
BLOOD PRODUCT EXPIRATION DATE: 201902032359
ISSUE DATE / TIME: 201901080855
UNIT TYPE AND RH: 5100

## 2017-05-03 LAB — PROCALCITONIN: PROCALCITONIN: 0.94 ng/mL

## 2017-05-03 LAB — COOXEMETRY PANEL
Carboxyhemoglobin: 2.6 % — ABNORMAL HIGH (ref 0.5–1.5)
Methemoglobin: 1.2 % (ref 0.0–1.5)
O2 Saturation: 54.8 %
TOTAL HEMOGLOBIN: 9.6 g/dL — AB (ref 12.0–16.0)

## 2017-05-03 LAB — HEPARIN LEVEL (UNFRACTIONATED): Heparin Unfractionated: 0.54 IU/mL (ref 0.30–0.70)

## 2017-05-03 LAB — LACTATE DEHYDROGENASE: LDH: 405 U/L — ABNORMAL HIGH (ref 98–192)

## 2017-05-03 MED ORDER — DOBUTAMINE IN D5W 4-5 MG/ML-% IV SOLN
6.0000 ug/kg/min | INTRAVENOUS | Status: DC
  Administered 2017-05-04: 6 ug/kg/min via INTRAVENOUS
  Filled 2017-05-03: qty 250

## 2017-05-03 MED ORDER — WARFARIN - PHARMACIST DOSING INPATIENT
Freq: Every day | Status: DC
  Administered 2017-05-03: 17:00:00

## 2017-05-03 MED ORDER — WARFARIN SODIUM 2 MG PO TABS
2.0000 mg | ORAL_TABLET | Freq: Once | ORAL | Status: AC
  Administered 2017-05-03: 2 mg via ORAL
  Filled 2017-05-03: qty 1

## 2017-05-03 MED ORDER — SODIUM GLYCEROPHOSPHATE 1 MMOLE/ML IV SOLN
20.0000 mmol | Freq: Once | INTRAVENOUS | Status: AC
  Administered 2017-05-03: 20 mmol via INTRAVENOUS
  Filled 2017-05-03: qty 20

## 2017-05-03 MED ORDER — HEPARIN (PORCINE) IN NACL 100-0.45 UNIT/ML-% IJ SOLN
1100.0000 [IU]/h | INTRAMUSCULAR | Status: DC
  Administered 2017-05-03: 1200 [IU]/h via INTRAVENOUS
  Filled 2017-05-03: qty 250

## 2017-05-03 NOTE — Progress Notes (Signed)
Patient ID: David [name], male   DOB: 19-Jun-1965, 52 y.o.   MRN: [id number]   Advanced Heart Failure VAD Team Note  Subjective:    David [name] is a 52 y.o. male with a PMH of ICM ('09 PCI to LAD, mRCA, '10 PCI to Lcx and '12 mRCA), HFrEF (EF ~20% for >5 years), HLD and HTN admitted 12/26 for VAD placement due to end-stage systolic HF.   Underwent successful HM-3 LVAD placement 04/20/17.  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   -Extubated 12/28.  -Developed AKI. CVVHD started 12/30 -ReIntubated 04/23/17  -Developed progressive shock with uptitration of norepinephrine and epinephrine and addition of vasopressin.  Milrinone stopped and dobutamine begun.  CXR with CHF.   -Re-extubated 04/26/2017 -Started on Meropenem 04/27/16 with + Achromobacter in tracheal aspirate.  Yesterday weaned off epi and received 1UPRBCs. Remains on  Dobutamine 6 mcg. Today CO-OX is 55%.   Denies SOB/CP.  Appetite good. WBC coming down slightly.   LDH 405 INR 1.65    Echo 12/31 reviewed: No significant pericardial effusion, IV septum midline to slightly to the right, RV function adequate.   LVAD INTERROGATION:  HeartMate III LVAD:  Flow  4.6  liters/min, speed5300, power 4, PI 4.2   Objective:    Vital Signs:   Temp:  [97.5 F (36.4 C)-98.8 F (37.1 C)] 97.5 F (36.4 C) (01/09 0800) Pulse Rate:  [28-113] 88 (01/09 0800) Resp:  [13-30] 25 (01/09 0800) BP: (74-104)/(54-89) 103/79 (01/09 0800) SpO2:  [90 %-100 %] 99 % (01/09 0800) Last BM Date: 05/03/17 Mean arterial Pressure 80-90s   Intake/Output:   Intake/Output Summary (Last 24 hours) at 05/03/2017 0905 Last data filed at 05/03/2017 0800 Gross per 24 hour  Intake 1391.32 ml  Output 2191 ml  Net -799.68 ml     Physical Exam  Physical Exam: CVP 11  GENERAL: Sitting in the chair.  HEENT: normal  NECK: Supple, JVP 11-12  .  2+ bilaterally, no bruits.  No lymphadenopathy or thyromegaly appreciated.  RIJ HD LIJ  CARDIAC:   Mechanical heart sounds with LVAD hum present.  LUNGS:  Clear to auscultation bilaterally.  ABDOMEN:  Soft, round, nontender, positive bowel sounds x4.     LVAD exit site: Dressing dry and intact.  No erythema or drainage.  Stabilization device present and accurately applied.  Driveline dressing is being changed daily per sterile technique. EXTREMITIES:  Warm and dry, no cyanosis, clubbing, rash or edema  NEUROLOGIC:  Alert and oriented x 4.  Gait steady.  No aphasia.  No dysarthria.  Affect pleasant.      Telemetry   NSR 90s   Labs   Basic Metabolic Panel: Recent Labs  Lab 04/29/17 0353  04/30/17 0525  05/01/17 0408 05/01/17 1525 05/02/17 0335 05/02/17 1547 05/03/17 0335  NA 135  135   < > 129*   < > 132*  132* 131* 129* 132* 131*  K 4.1  4.0   < > 4.2   < > 4.4  4.4 4.6 4.4 4.7 4.3  CL 100*  101   < > 95*   < > 98*  98* 99* 97* 98* 99*  CO2 27  26   < > 26   < > 27  27 26 26 27 27   GLUCOSE 100*  101*   < > 109*   < > 104*  102* 104* 94 89 78  BUN 13  12   < > 11   < > 10  10 13 10 11 11   CREATININE 1.68*  1.72*   < > 1.67*   < > 1.93*  1.88* 2.18* 1.93* 1.84* 1.84*  CALCIUM 7.8*  8.3*   < > 7.9*   < > 7.9*  7.9* 7.8* 7.8* 8.0* 8.0*  MG 2.3  --  2.4  --  2.4  --  2.3  --  2.3  PHOS 2.8   < >  --    < > 2.2* 2.5 2.2* 2.3* 2.1*   < > = values in this interval not displayed.    Liver Function Tests: Recent Labs  Lab 04/28/17 0400  04/29/17 0353  04/30/17 0525  05/01/17 0408 05/01/17 1525 05/02/17 0335 05/02/17 1547 05/03/17 0335  AST 119*  --  99*  --  109*  --  96*  --  92*  --   --   ALT 112*  --  92*  --  88*  --  74*  --  66*  --   --   ALKPHOS 209*  --  253*  --  397*  --  423*  --  458*  --   --   BILITOT 10.7*  --  9.2*  --  8.5*  --  6.1*  --  5.2*  --   --   PROT 6.5  --  6.5  --  6.7  --  6.8  --  6.4*  --   --   ALBUMIN 1.8*   < > 1.8*  1.8*   < > 1.7*   < > 1.8*  1.7* 1.7* 1.6* 1.8* 1.7*   < > = values in this interval not displayed.     No results for input(s): LIPASE, AMYLASE in the last 168 hours. No results for input(s): AMMONIA in the last 168 hours.  CBC: Recent Labs  Lab 04/27/17 0349 04/28/17 0400 04/30/17 0525 05/02/17 0335 05/02/17 1321 05/03/17 0335  WBC 19.0* 21.4* 21.8*  --  22.6* 20.0*  NEUTROABS 16.6*  --   --   --   --   --   HGB 9.2* 8.9* 8.5* 7.6* 9.3* 8.7*  HCT 28.7* 27.7* 26.1* 23.9* 28.7* 27.6*  MCV 77.2* 77.8* 78.1  --  80.6 80.7  PLT 133* 181 265  --  321 336    INR: Recent Labs  Lab 04/29/17 0353 04/30/17 0525 05/01/17 0408 05/02/17 0335 05/03/17 0335  INR 3.16 2.39 2.23 1.70 1.65    Other results:  Imaging   No results found.   Medications:     Scheduled Medications: . aspirin EC  81 mg Oral Daily  . chlorhexidine  15 mL Mouth Rinse BID  . Chlorhexidine Gluconate Cloth  6 each Topical Daily  . feeding supplement (ENSURE ENLIVE)  237 mL Oral TID BM  . heparin injection (subcutaneous)  5,000 Units Subcutaneous Q8H  . mouth rinse  15 mL Mouth Rinse q12n4p  . pantoprazole  40 mg Oral Daily  . Warfarin - Physician Dosing Inpatient   Does not apply q1800    Infusions: . sodium chloride Stopped (04/30/17 0800)  . sodium chloride    . DOBUTamine 6 mcg/kg/min (05/03/17 0700)  . EPINEPHrine 4 mg in dextrose 5% 250 mL infusion (16 mcg/mL) Stopped (05/02/17 1601)  . meropenem (MERREM) IV Stopped (05/03/17 0504)  . norepinephrine (LEVOPHED) Adult infusion Stopped (05/01/17 2316)  . dialysis replacement fluid (prismasate) 400 mL/hr at 05/03/17 0038  . dialysis replacement fluid (prismasate) 200 mL/hr at 05/03/17 0038  .  dialysate (PRISMASATE) 1,700 mL/hr at 05/03/17 0814  . sodium chloride      PRN Medications: heparin, levalbuterol, morphine injection, ondansetron (ZOFRAN) IV, oxyCODONE, simethicone, sodium chloride, sodium chloride flush, sodium chloride flush   Patient Profile   David [name] is a 52 y.o. male with a PMH of ICM ('09 PCI to LAD, mRCA, '10 PCI to  Lcx and '12 mRCA), HFrEF (EF ~20% for >5 years) s/p HM-III VAD placement on 12/27 due to end-stage systolic HF.   Assessment/Plan:    1. Acute on chronic systolic HF: Echo 16/10/96 with LVEF 20-25%, Mild MR, Mild/Mod MR, Severe LAE, Mildly reduced RV, PA peak pressure 56 mm Hg.  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 Extubated, initially stopped epinephrine/norepinephrine but restarted with fall in UOP and MAP.  With progressive renal failure, pulmonary edema, and hypotension, he was re-intubated on 12/30.   CVVH was begun.  12/31 was difficult day with refractory shock (MAP in 60s).  Reviewed 12/31 echo which showed no significant pericardial effusion, adequate RV function, and IV septum near mid-line. Given suspected for vasodilatory/septic shock with PCT 31 04/24/17, he is covered with cefepime.   - Off NE/Epi. Remains on dobutamine - CO-OX 55%. Cut back dobutamine 5 mcg.  - Continue CVVH running pulling 50cc  - Cotninue aspirin 81 mg daily.  - Warfarin goal INR 2-2.5. INR 1.65.  On subcutaneous heparin. CT surgery dosing coumadin.  - LDH  405  2. CAD s/p PCI to mid RCA and PLOM with DES x 2 and DES to mid LAD in 1/18.   - No s/s ischemia.  - Continue ASA and statin. No change.   3. PAF: In NSR.  4. ARF on CKD stage III: Suspect some degree of intrinsic renal disease prior to LVAD placement (baseline creatinine around 1.5) with probable ATN from intra-op/peri-op hypotension and development of vasodilatory/septic shock.   - Remains on CVVHD. Pulling 50 per hour. Plans to stop tomorrow.   - - Appreciate Renal in put.  5. Anemia expected:    s/p 1 u PRBCs 12/30, 04/25/17. 1/8.  -Hgb 8.7 today.  6. ID: Started on cefepime empirically with septic/vasodilatory shock, PCT was 31 04/25/17 Cultures (blood, sputum) NGTD   - Improving. PCT now coming down. WBC stable at 21k - ABX broadened to Meropenem 04/27/16 with + Achromobacter on tracheal aspirate.  - PCT 8.31  04/27/17 -> 3.87 ->0.94 7. Elevated LFTs due to shock liver - Continues to improve.   Hopefully can mobilize tomorrow once CVVHD stopped.   Length of Stay: [place], NP 05/03/2017, 9:05 AM  VAD Team --- VAD ISSUES ONLY--- Pager [phone] (7am - 7am)  Advanced Heart Failure Team  Pager [phone] (M-F; 7a - 4p)  Please contact [facility] for night-coverage after hours (4p -7a ) and weekends on [email or web address]  Patient seen and examined with Darrick Grinder, NP. We discussed all aspects of the encounter. I agree with the assessment and plan as stated above.   Remains tenuous. On CVVHD. Minimal urine output. Pressors have been weaned. Now on dobutamine only. Agree with slow wean. Renal preparing to switch to IHD so can transfer to 2c. Liver function continues to recover. Hopefully we will se some renal recovery soon. INR 1.6 if drops further would consider IV heparin at low dose. Continue SQ heparin for now. VAD interrogated personally. Parameters stable. Continue to mobilize. Rhythm stable.   Glori Bickers, MD  2:40 PM

## 2017-05-03 NOTE — Progress Notes (Signed)
Nutrition Follow-up  DOCUMENTATION CODES:   Not applicable  INTERVENTION:   Continue Ensure Enlive po TID, each supplement provides 350 kcal and 20 grams of protein  NUTRITION DIAGNOSIS:   Increased nutrient needs related to (LVAD) as evidenced by estimated needs.  Ongoing  GOAL:   Patient will meet greater than or equal to 90% of their needs  Progressing  MONITOR:   PO intake, Supplement acceptance, Labs, Weight trends, I & O's  ASSESSMENT:   Pt with PMH of HLD, HTN, end-stage CHF admitted for HM-3 LVAD placement 12/27. Extubated 12/28, pt with hypotension and poor UOP CRRT started 12/30. Pt re-intubated 12/30 with worsening acidosis and respiratory distress.   Pt s/p SLP evaluation, recommended Dysphagia 3 diet with thin liquids r/t edentulous status.  Pt continues on CRRT. Per Nephrology notes, interested in transition[facility] to [facility].  Discussed pt with RN, reports pt consumed 75% of breakfast this morning and is drinking his Ensure supplements. Pt reports an increasing appetite.  Pt reports no N/V and endorses regular bowel movements.  Labs reviewed; Na 131, Phosphorus 2.1, Albumin 1.7, Hemoglobin 8.7 Medications reviewed; Protonix, Coumadin  Diet Order:  DIET DYS 3 Room service appropriate? Yes; Fluid consistency: Thin  EDUCATION NEEDS:   No education needs have been identified at this time  Skin:  Skin Assessment: Skin Integrity Issues: Skin Integrity Issues:: Incisions Incisions: sternum and abdomen  Last BM:  05/03/17  Height:   Ht Readings from Last 1 Encounters:  04/19/17 5\' 5"  (1.651 m)    Weight:   Wt Readings from Last 1 Encounters:  05/02/17 157 lb 13.6 oz (71.6 kg)    Ideal Body Weight:  61.8 kg  BMI:  Body mass index is 26.27 kg/m.  Estimated Nutritional Needs:   Kcal:  2100-2300  Protein:  142 -178 grams  Fluid:  >1.5 L/day  [place] Ranger, MS, RDN, LDN 05/03/2017 11:43 AM

## 2017-05-03 NOTE — Progress Notes (Signed)
LVAD Coordinator Rounding Note:  Admitted12/26due to decompensated heart failure.  Heartmate IIILVAD [facility].AV valve oversewn due to aortic insufficiency.  Vital signs: Tmax:97.5 HR:94 Doppler Pressure:90 Automatic cuff:  103/79 (89) O2 Sat:100% on 2LNC Wt:165>164>156>160>160>154>158>156>157lbs  LVAD interrogation reveals:  Speed:5300 Flow: 4.7 Power: 3.8w PI:4.3 Alarms:none Events:no PI events over last 24 hrs Hematocrit:28 Fixed speed:5300 Low speed limit:5000  Labs:  LDH trend:205>407>576>923>967>612>528>492>467>489>441>434>405  INR trend:1.34>1.52>1.73>1.95>2.32>3.03>3.80>3.16>2.39>2.23>1.70>1.65  Anticoagulation Plan: -INR Goal:2-2.5  -ASA Dose: 81 mg  Blood Products: Intra op:2 units/FFP 04/20/17  Post op: PRBC's 1 unit 12/29 1 unit 12/30 1 unit 04/25/17  Device:[employer]tronic BiV -Therapies:off  Arrhythmia: 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 (on amiodarone and Eliquis for this) - Afib 12/29 - 04/28/17 - NSR 1/5 - present  Nitric Oxide: -  stopped on 12/30 due to initiation of BiPap  Respiratory: - Extubated 12/28 - Reintubated 04/23/2017 - Extubated 04/26/2017  Renal:  04/23/17 - CRRT started for AKI  Infection: - 04/27/17 + 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 VAD coordinator to change VAD dressing today with weekend nurse Angela 2. Encourage patient/wife to complete reading HM III Patient Handbook 3. Ask patient/wife to start filling out daily Logbook while in hospital prior to discharge home 2. Please call VAD Pager for any equipment concerns or drive line dressing questions/concerns   Zada Girt RN, [place] Coordinator 24/7 pager [phone]

## 2017-05-03 NOTE — Progress Notes (Signed)
erpicardial pacing wires removed per order and per protocol.  No complications.  Will continue to monitor closely and update as needed.

## 2017-05-03 NOTE — Progress Notes (Signed)
Patient ID: David [name], male   DOB: 12-Aug-1965, 52 y.o.   MRN: [id number] HeartMate 3 Rounding Note  Subjective:    No specific complaints. Feels well Remains on dobutamine 6 Co-ox 54.8 this am  CVP 11 Remains on CRRT but had passed 100 cc dark colored urine this am.   LVAD INTERROGATION:  HeartMate IIl LVAD:  Flow 4.4 liters/min, speed 5300, power 4, PI 5.    Objective:    Vital Signs:   Temp:  [97.7 F (36.5 C)-98.8 F (37.1 C)] 98.5 F (36.9 C) (01/08 1953) Pulse Rate:  [28-113] 90 (01/09 0700) Resp:  [13-30] 18 (01/09 0700) BP: (74-104)/(54-89) 94/78 (01/09 0600) SpO2:  [90 %-100 %] 100 % (01/09 0700) Last BM Date: 05/02/17 Mean arterial Pressure 85  Intake/Output:   Intake/Output Summary (Last 24 hours) at 05/03/2017 0803 Last data filed at 05/03/2017 0700 Gross per 24 hour  Intake 1384.82 ml  Output 2131 ml  Net -746.18 ml     Physical Exam: General:  Chronically ill-appearing but awake and alert. Up in chair since 6 am. No resp difficulty HEENT: abrasions on nose from tape healing Neck: left IJ central line Cor: Normal heart sounds with LVAD hum present. Lungs: clear Abdomen: soft, nontender, nondistended. Good bowel sounds. Extremities: minimal edema in arms Neuro: alert & orientedx3,  moves all 4 extremities w/o difficulty. Affect pleasant  Telemetry: sinus rhythm 88  Labs: Basic Metabolic Panel: Recent Labs  Lab 04/29/17 0353  04/30/17 0525  05/01/17 0408 05/01/17 1525 05/02/17 0335 05/02/17 1547 05/03/17 0335  NA 135  135   < > 129*   < > 132*  132* 131* 129* 132* 131*  K 4.1  4.0   < > 4.2   < > 4.4  4.4 4.6 4.4 4.7 4.3  CL 100*  101   < > 95*   < > 98*  98* 99* 97* 98* 99*  CO2 27  26   < > 26   < > 27  27 26 26 27 27   GLUCOSE 100*  101*   < > 109*   < > 104*  102* 104* 94 89 78  BUN 13  12   < > 11   < > 10  10 13 10 11 11   CREATININE 1.68*  1.72*   < > 1.67*   < > 1.93*  1.88* 2.18* 1.93* 1.84* 1.84*  CALCIUM 7.8*  8.3*    < > 7.9*   < > 7.9*  7.9* 7.8* 7.8* 8.0* 8.0*  MG 2.3  --  2.4  --  2.4  --  2.3  --  2.3  PHOS 2.8   < >  --    < > 2.2* 2.5 2.2* 2.3* 2.1*   < > = values in this interval not displayed.    Liver Function Tests: Recent Labs  Lab 04/28/17 0400  04/29/17 0353  04/30/17 0525  05/01/17 0408 05/01/17 1525 05/02/17 0335 05/02/17 1547 05/03/17 0335  AST 119*  --  99*  --  109*  --  96*  --  92*  --   --   ALT 112*  --  92*  --  88*  --  74*  --  66*  --   --   ALKPHOS 209*  --  253*  --  397*  --  423*  --  458*  --   --   BILITOT 10.7*  --  9.2*  --  8.5*  --  6.1*  --  5.2*  --   --   PROT 6.5  --  6.5  --  6.7  --  6.8  --  6.4*  --   --   ALBUMIN 1.8*   < > 1.8*  1.8*   < > 1.7*   < > 1.8*  1.7* 1.7* 1.6* 1.8* 1.7*   < > = values in this interval not displayed.   No results for input(s): LIPASE, AMYLASE in the last 168 hours. No results for input(s): AMMONIA in the last 168 hours.  CBC: Recent Labs  Lab 04/27/17 0349 04/28/17 0400 04/30/17 0525 05/02/17 0335 05/02/17 1321 05/03/17 0335  WBC 19.0* 21.4* 21.8*  --  22.6* 20.0*  NEUTROABS 16.6*  --   --   --   --   --   HGB 9.2* 8.9* 8.5* 7.6* 9.3* 8.7*  HCT 28.7* 27.7* 26.1* 23.9* 28.7* 27.6*  MCV 77.2* 77.8* 78.1  --  80.6 80.7  PLT 133* 181 265  --  321 336    INR: Recent Labs  Lab 04/29/17 0353 04/30/17 0525 05/01/17 0408 05/02/17 0335 05/03/17 0335  INR 3.16 2.39 2.23 1.70 1.65    Other results:  EKG:   Imaging: No results found.   Medications:     Scheduled Medications: . aspirin EC  81 mg Oral Daily  . chlorhexidine  15 mL Mouth Rinse BID  . Chlorhexidine Gluconate Cloth  6 each Topical Daily  . feeding supplement (ENSURE ENLIVE)  237 mL Oral TID BM  . heparin injection (subcutaneous)  5,000 Units Subcutaneous Q8H  . mouth rinse  15 mL Mouth Rinse q12n4p  . pantoprazole  40 mg Oral Daily  . Warfarin - Physician Dosing Inpatient   Does not apply q1800    Infusions: . sodium chloride  Stopped (04/30/17 0800)  . sodium chloride    . DOBUTamine 6 mcg/kg/min (05/03/17 0700)  . EPINEPHrine 4 mg in dextrose 5% 250 mL infusion (16 mcg/mL) Stopped (05/02/17 1601)  . meropenem (MERREM) IV Stopped (05/03/17 0504)  . norepinephrine (LEVOPHED) Adult infusion Stopped (05/01/17 2316)  . dialysis replacement fluid (prismasate) 400 mL/hr at 05/03/17 0038  . dialysis replacement fluid (prismasate) 200 mL/hr at 05/03/17 0038  . dialysate (PRISMASATE) 1,700 mL/hr at 05/03/17 0424  . sodium chloride      PRN Medications: heparin, levalbuterol, morphine injection, ondansetron (ZOFRAN) IV, oxyCODONE, simethicone, sodium chloride, sodium chloride flush, sodium chloride flush   Assessment:   1. Ischemic cardiomyopathy s/p multiple PCI's with chronic systolic heart failure and EF 20% on home dobutamine preop with significant improvement in symptoms. Evaluated at [facility] for heart [facility] but LVAD felt to be the best treatment for him at this time. POD14s/p HeartMate III LVAD. He is critically ill with multi-system organ failure but improving.Hemodynamics stable on dobut 6. I think this could be very slowly weaned. 2.H/OHypertension 3.H/OHyperlipidemia 4. PAF in sinuspreopon amiodarone and Eliquis.Now back in sinus off amiodarone.  5.H/OStage III CKD. Acute on chronic postop renal failurewith anuria now on CRRT. He is making a little urine so still hopeful for recovery. Will need to decide about conversion to intermittent HD. 6. LBBB s/p CRT-D upgrade in 10/2016 without improvement in symptoms or EF. LV epicardial leads cut off at the time of surgery. 7. Prior smoking with mild obstruction and severe diffusion defect on PFT's. CXRhas been stable. Continue IS. 8. Postop leukocytosis and fever. WBC down slightly today and PCT continues to decreaese. Sputum culture grew a  few achromobacter resistant to Maxipime so broadened to meropenem.  9. Marked rise in bilirubin and  transaminases. Likely due to pump runand right sided congestion, hypotension.Bili and transaminases improving. 10. INR is1.65 today. Coumadin per pharmacy. Got 2 mg last night and on subQ heparin.    11. Taking PO fairly well. Continue to encourage nutrition.    12. PT    13. Anemia: drop in Hgb most likely due to blood draws and loss in CRRT filters. No visible blood loss otherwise. Hgb improved with transfusion yesterday.     Plan/Discussion:    Overall he has been stable. MAP in good range off pressors. Discuss slow dobut wean with DB.  His Co-ox is up and down and will be mostly dependent on PO2 at this point which is going to be on the low side with his lung disease.  VAD flow has been great. I think he is euvolemic with CVP 11 and no peripheral edema so could keep I/O even to slightly negative. Nephrology will decide on time for conversion to intermittent HD. Continue to mobilize and encourage nutrition.  I reviewed the LVAD parameters from today, and compared the results to the patient's prior recorded data.  No programming changes were made.  The LVAD is functioning within specified parameters.   LVAD interrogation was negative for any significant power changes, alarms or PI events/speed drops.  LVAD equipment check completed and is in good working order.  Back-up equipment present.   LVAD education done on emergency procedures and precautions and reviewed exit site care.  Length of Stay: [address]  Fernande Boyden [facility], [place] 05/03/2017, 8:03 AM

## 2017-05-03 NOTE — Progress Notes (Signed)
PT Cancellation Note  Patient Details Name: David [name] MRN: [id number] DOB: [birth year]   Cancelled Treatment:    Reason Eval/Treat Not Completed: (P) Medical issues which prohibited therapy(Pt remains on CRRT and RN reports he will come off tomorrow and be more appropriate for therapy.  Rn reports he has been up to chair with staff today and to hold therapy while on CRRT.  )   Malisa Ruggiero Eli Hose 05/03/2017, 12:16 PM  [name], PTA pager [phone]

## 2017-05-03 NOTE — Progress Notes (Signed)
31M with sCHF and ICM s/p HM3 LVAD 93/79 complicated by shock on pressors, respoiratory failure    Patient is on CRRT and appears to be tolerating fine - discussed management this morning with Dr Cyndia Bent -- interested in transitioning to intermittent hemodialysis to help with progression   Start Date: 04/23/17 Catheter: R Foraker Temp HD cath BFR: 200 4K Pre Blood Pump: 400 4K DFR: 1700 4K  Replacement Rate: 200 4K Goal UF: 0-16mL hr net neg Anticoagulation: no heparin in circuit; on warfarin    Inflammatory markers improving     Objective:  Vital signs in last 24 hours:  Temp:  [97.4 F (36.3 C)-98.4 F (36.9 C)] 97.9 F (36.6 C) (01/07 0746) Pulse Rate:  [30-106] 35 (01/07 0900) Resp:  [19-37] 24 (01/07 0900) BP: (83-112)/(54-87) 97/75 (01/07 0800) SpO2:  [77 %-100 %] 77 % (01/07 0900) Weight:  [156 lb 4.9 oz (70.9 kg)] 156 lb 4.9 oz (70.9 kg) (01/07 0500)  Weight change: -15.7 oz (-0.9 kg)      Filed Weights   04/29/17 0433 04/30/17 0500 05/01/17 0500  Weight: 154 lb 15.7 oz (70.3 kg) 158 lb 4.6 oz (71.8 kg) 156 lb 4.9 oz (70.9 kg)    Intake/Output: I/O last 3 completed shifts: In: 2950.8 [P.O.:1580; I.V.:1070.8; IV Piggyback:300] Out: 5002 [Urine:200; Other:4802]   Intake/Output this shift:  Total I/O In: 172.4 [P.O.:120; I.V.:52.4] Out: 200 [Urine:50; Other:150]  CVS- RRR  Mechanical sound of LVAD  RS- CTA ABD- BS present soft non-distended EXT- no edema   Basic Metabolic Panel: LastLabs             Recent Labs  Lab 04/27/17 0349  04/28/17 0400 04/28/17 1630 04/29/17 0353 04/29/17 2030 04/30/17 0525 04/30/17 1617 05/01/17 0408  NA 136   < > 136 135 135  135 129* 129* 130* 132*  132*  K 4.0   < > 4.0 4.0 4.1  4.0 4.3 4.2 4.5 4.4  4.4  CL 102   < > 103 101 100*  101 97* 95* 98* 98*  98*  CO2 24   < > 26 26 27  26 25 26 25 27  27   GLUCOSE 80   < > 96 124* 100*  101* 98 109* 104* 104*  102*  BUN 16   < > 13 12 13  12 10  11 9 10  10   CREATININE 1.64*   < > 1.69* 1.61* 1.68*  1.72* 1.65* 1.67* 1.76* 1.93*  1.88*  CALCIUM 8.1*   < > 8.0* 8.0* 7.8*  8.3* 7.6* 7.9* 7.9* 7.9*  7.9*  MG 2.6*  --  2.6*  --  2.3  --  2.4  --  2.4  PHOS 2.2*   < > 2.3* 1.9* 2.8 2.4*  --  2.2* 2.2*   < > = values in this interval not displayed.      Liver Function Tests: LastLabs              Recent Labs  Lab 04/27/17 0349 04/27/17 1050  04/28/17 0400  04/29/17 0353 04/29/17 2030 04/30/17 0525 04/30/17 1617 05/01/17 0408  AST 149*  --   --  119*  --  99*  --  109*  --  96*  ALT 144*  --   --  112*  --  92*  --  88*  --  74*  ALKPHOS 191*  --   --  209*  --  253*  --  397*  --  423*  BILITOT 10.7* 10.8*  --  10.7*  --  9.2*  --  8.5*  --  6.1*  PROT 6.5  --   --  6.5  --  6.5  --  6.7  --  6.8  ALBUMIN 1.9*  --    < > 1.8*   < > 1.8*  1.8* 1.8* 1.7* 1.8* 1.8*  1.7*   < > = values in this interval not displayed.     LastLabs  No results for input(s): LIPASE, AMYLASE in the last 168 hours.   LastLabs  No results for input(s): AMMONIA in the last 168 hours.    CBC: LastLabs         Recent Labs  Lab 04/25/17 0359 04/26/17 0343 04/27/17 0349 04/28/17 0400 04/30/17 0525  WBC 14.3* 13.5* 19.0* 21.4* 21.8*  NEUTROABS 11.8* 11.2* 16.6*  --   --   HGB 8.1* 8.9* 9.2* 8.9* 8.5*  HCT 25.1* 26.9* 28.7* 27.7* 26.1*  MCV 77.5* 78.4 77.2* 77.8* 78.1  PLT 118* 114* 133* 181 265      Cardiac Enzymes: LastLabs  No results for input(s): CKTOTAL, CKMB, CKMBINDEX, TROPONINI in the last 168 hours.    BNP: LastLabs  Invalid input(s): POCBNP    CBG: LastLabs         Recent Labs  Lab 04/26/17 1643 04/26/17 1951 04/26/17 2349 04/27/17 0406 04/27/17 0754  GLUCAP 78 82 104* 42 75      Microbiology:        Results for orders placed or performed during the hospital encounter of 04/19/17  Surgical pcr screen     Status: None   Collection Time: 04/19/17  1:58 PM  Result Value  Ref Range Status   MRSA, PCR NEGATIVE NEGATIVE Final   Staphylococcus aureus NEGATIVE NEGATIVE Final    Comment: (NOTE) The Xpert SA Assay (FDA approved for NASAL specimens in patients 61 years of age and older), is one component of a comprehensive surveillance program. It is not intended to diagnose infection nor to guide or monitor treatment.   Culture, blood (routine x 2)     Status: None   Collection Time: 04/22/17 11:25 AM  Result Value Ref Range Status   Specimen Description BLOOD RIGHT ARM  Final   Special Requests IN PEDIATRIC BOTTLE Blood Culture adequate volume  Final   Culture NO GROWTH 5 DAYS  Final   Report Status 04/27/2017 FINAL  Final  Culture, blood (routine x 2)     Status: None   Collection Time: 04/22/17 11:36 AM  Result Value Ref Range Status   Specimen Description BLOOD RIGHT HAND  Final   Special Requests IN PEDIATRIC BOTTLE Blood Culture adequate volume  Final   Culture NO GROWTH 5 DAYS  Final   Report Status 04/27/2017 FINAL  Final  Culture, respiratory (NON-Expectorated)     Status: None   Collection Time: 04/24/17  8:16 AM  Result Value Ref Range Status   Specimen Description TRACHEAL ASPIRATE  Final   Special Requests NONE  Final   Gram Stain   Final    MODERATE WBC PRESENT,BOTH PMN AND MONONUCLEAR NO ORGANISMS SEEN    Culture FEW ACHROMOBACTER SPECIES  Final   Report Status 04/27/2017 FINAL  Final   Organism ID, Bacteria ACHROMOBACTER SPECIES  Final      Susceptibility   Achromobacter species - MIC*    CEFEPIME >=64 RESISTANT Resistant     CEFAZOLIN >=64 RESISTANT Resistant     GENTAMICIN 4  SENSITIVE Sensitive     CIPROFLOXACIN 2 INTERMEDIATE Intermediate     IMIPENEM 1 SENSITIVE Sensitive     TRIMETH/SULFA <=20 SENSITIVE Sensitive     * FEW ACHROMOBACTER SPECIES  Urine Culture     Status: None   Collection Time: 04/25/17 12:38 PM  Result Value Ref Range Status   Specimen  Description URINE, CATHETERIZED  Final   Special Requests Normal  Final   Culture NO GROWTH  Final   Report Status 04/26/2017 FINAL  Final    Coagulation Studies: RecentLabs(last2labs)       Recent Labs    04/29/17 0353 04/30/17 0525 05/01/17 0408  LABPROT 32.2* 25.9* 24.5*  INR 3.16 2.39 2.23      Urinalysis:  RecentLabs(last2labs)  No results for input(s): COLORURINE, LABSPEC, PHURINE, GLUCOSEU, HGBUR, BILIRUBINUR, KETONESUR, PROTEINUR, UROBILINOGEN, NITRITE, LEUKOCYTESUR in the last 72 hours.  Invalid input(s): APPERANCEUR      Imaging: ImagingResults(Last48hours)  No results found.     Medications:   . sodium chloride Stopped (04/30/17 0800)  . DOBUTamine 6 mcg/kg/min (05/01/17 0700)  . EPINEPHrine 4 mg in dextrose 5% 250 mL infusion (16 mcg/mL) 4 mcg/min (05/01/17 0700)  . meropenem (MERREM) IV Stopped (05/01/17 0458)  . norepinephrine (LEVOPHED) Adult infusion 4 mcg/min (05/01/17 0859)  . dialysis replacement fluid (prismasate) 400 mL/hr at 04/30/17 2116  . dialysis replacement fluid (prismasate) 200 mL/hr at 04/30/17 2215  . dialysate (PRISMASATE) 1,700 mL/hr at 05/01/17 0510  . sodium chloride     . aspirin EC  81 mg Oral Daily  . chlorhexidine  15 mL Mouth Rinse BID  . Chlorhexidine Gluconate Cloth  6 each Topical Daily  . feeding supplement (ENSURE ENLIVE)  237 mL Oral TID BM  . heparin injection (subcutaneous)  5,000 Units Subcutaneous Q8H  . mouth rinse  15 mL Mouth Rinse q12n4p  . pantoprazole  40 mg Oral Daily  . warfarin  1 mg Oral ONCE-1800  . Warfarin - Physician Dosing Inpatient   Does not apply q1800   heparin, levalbuterol, morphine injection, ondansetron (ZOFRAN) IV, oxyCODONE, sodium chloride, sodium chloride flush, sodium chloride flush  Assessment/ Plan:  1. 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 Pigment nephropathy  2. ICM s/p LVAD 12/27; on  dobutamine  Weaning  3. Shock off NE/Epi per AHF + TCTS weaning pressors 4. VDRF, now extubated 5. Hypophosphatemia, repleted 1/3;monitoring, now on diet might stablized w/o further IV phos 6. Elevated bilirubin; elevated transaminates, improving 7. Leukocytosis, +ancrhomobacter on trach aspirate 12/31 on meropenem; 12/31 B Cx x2 NGF 8. Anemia transfusing per AHF  P 1. Cont CRRT at current settings.No heparin as running well for past 24h 2. UOP is positive, follow today 3. Ready to transition to iHD; no evidence of GFR recovery although will  monitor   Seen on CRRT   --  Discussed with Dr Cyndia Bent today and will aim to transition to IHD tomorrow    LOS: 12 David [name] W @TODAY @9 :02 AM

## 2017-05-03 NOTE — Progress Notes (Signed)
ICD turned back on by [facility].

## 2017-05-03 NOTE — Progress Notes (Addendum)
ANTICOAGULATION CONSULT NOTE - Follow Up Consult  Pharmacy Consult for Coumadin; add Heparin Indication: LVAD  Allergies  Allergen Reactions  . Plavix [Clopidogrel Bisulfate] Hives    Patient Measurements: Height: 5\' 5"  (165.1 cm) Weight: 157 lb 13.6 oz (71.6 kg) IBW/kg (Calculated) : 61.5 Heparin Dosing Weight: n/a  Vital Signs: Temp: 97.5 F (36.4 C) (01/09 0800) Temp Source: Oral (01/09 0800) BP: 73/64 (01/09 1000) Pulse Rate: 88 (01/09 0800)  Labs: Recent Labs    05/01/17 0408  05/02/17 0335 05/02/17 1321 05/02/17 1547 05/03/17 0335  HGB  --    < > 7.6* 9.3*  --  8.7*  HCT  --   --  23.9* 28.7*  --  27.6*  PLT  --   --   --  321  --  336  LABPROT 24.5*  --  19.8*  --   --  19.4*  INR 2.23  --  1.70  --   --  1.65  CREATININE 1.93*  1.88*   < > 1.93*  --  1.84* 1.84*   < > = values in this interval not displayed.    Estimated Creatinine Clearance: 40.4 mL/min (A) (by C-G formula based on SCr of 1.84 mg/dL (H)).   Assessment: 52 yo male s/p LVAD implantation 12/27.  Initiated on coumadin 12/28, then stopped after 2 doses due to shock liver/rising INR. INR back in goal range 1/6 and coumadin resumed per pharmacy dosing.  INR remains below goal at 1.65. Hgb stable 8.7. LDH stable 405. No bleeding reported.  Patient continues on SQ heparin for px as true anticoagulation state unclear with liver dysfunction. Will follow plan/length of therapy of this carefully.   Goal of Therapy:  INR 2-2.5 Monitor platelets by anticoagulation protocol: Yes   Plan:  1) Coumadin 2mg  again tonight 2) Daily INR 3) Follow up when to stop SQ heparin   Nena Jordan, PharmD, BCPS 05/03/2017 10:32 AM    ADDENDUM: With subtherapeutic INR, pharmacy asked to stop SQ heparin and start IV heparin. Last SQ dose at 1328. Previously therapeutic on 1200 units with levels ~ 0.4.  Plan: 1) Start heparin at 1200 units/hr 2) Check 6 hour heparin level 3) Daily heparin level and  CBC 4) Continue heparin until INR >/=1.8   Nena Jordan, PharmD, BCPS 05/03/2017, 3:09 PM

## 2017-05-03 NOTE — Progress Notes (Signed)
Presented to patients room today to observe staff nurse Levada Dy performed drive line exit wound care. Nurse pre-medicated patient with IV MS prior to dressing change due to patient's intolerance of dressing removal due to pain.   Existing VAD dressing removed and site care performed using sterile technique. Drive line exit site cleaned with Chlora prep applicators x 2, allowed to dry, and aqaucell silver strip with daily dressing kit re-applied. Exit site is red with small amount serosangenous drainage; suture removed with bleeding from site noted. The velour is not yet incorporated. No tenderness, drainage, foul odor or rash noted. Drive line anchors removed; replaced with one anchor to relieve tension on drive line.    Zada Girt RN, VAD Coordinator 24/7 VAD pager:  [phone]

## 2017-05-03 NOTE — Progress Notes (Signed)
[employer] rep - [name] called and notified of Bartle's order to turn pt's ICD back on.

## 2017-05-03 NOTE — Progress Notes (Signed)
CSW attempted to visit patient at bedside although patient sleeping. CSW will attempt visit tomorrow. Raquel Sarna, [place], [place]

## 2017-05-03 NOTE — Progress Notes (Signed)
ANTICOAGULATION CONSULT NOTE - Follow Up Consult  Pharmacy Consult for heparin Indication: LVAD  Allergies  Allergen Reactions  . Plavix [Clopidogrel Bisulfate] Hives    Patient Measurements: Height: 5\' 5"  (165.1 cm) Weight: 157 lb 13.6 oz (71.6 kg) IBW/kg (Calculated) : 61.5 Heparin Dosing Weight: 71.6 kg  Vital Signs: Temp: 97.2 F (36.2 C) (01/09 2000) Temp Source: Oral (01/09 2000) BP: 95/81 (01/09 2200) Pulse Rate: 90 (01/09 2200)  Labs: Recent Labs    05/01/17 0408  05/02/17 0335 05/02/17 1321 05/02/17 1547 05/03/17 0335 05/03/17 1553 05/03/17 2139  HGB  --    < > 7.6* 9.3*  --  8.7*  --   --   HCT  --   --  23.9* 28.7*  --  27.6*  --   --   PLT  --   --   --  321  --  336  --   --   LABPROT 24.5*  --  19.8*  --   --  19.4*  --   --   INR 2.23  --  1.70  --   --  1.65  --   --   HEPARINUNFRC  --   --   --   --   --   --   --  0.54  CREATININE 1.93*  1.88*   < > 1.93*  --  1.84* 1.84* 1.82*  --    < > = values in this interval not displayed.    Estimated Creatinine Clearance: 40.8 mL/min (A) (by C-G formula based on SCr of 1.82 mg/dL (H)).   Medications:  Medications Prior to Admission  Medication Sig Dispense Refill Last Dose  . amiodarone (PACERONE) 200 MG tablet Take 200 mg by mouth 2 (two) times daily.   04/19/2017 at 0300  . aspirin 81 MG chewable tablet Chew 1 tablet (81 mg total) by mouth daily. 30 tablet 6 04/19/2017 at 0300  . atorvastatin (LIPITOR) 40 MG tablet Take 1 tablet (40 mg total) by mouth daily at 6 PM. 30 tablet 6 04/18/2017 at Unknown time  . DOBUTamine (DOBUTREX) 4-5 MG/ML-% infusion Inject 352.5 mcg/min into the vein continuous. 250 mL 0 04/19/2017 at Unknown time  . furosemide (LASIX) 80 MG tablet Take 80 mg by mouth daily.   04/19/2017 at 0300  . ivabradine (CORLANOR) 5 MG TABS tablet Take 0.5 tablets (2.5 mg total) by mouth 2 (two) times daily with a meal. 60 tablet 0 04/19/2017 at 0300  . losartan (COZAAR) 25 MG tablet Take 0.5  tablets (12.5 mg total) by mouth daily. 15 tablet 6 04/19/2017 at 0300  . Multiple Vitamins-Minerals (ADULT GUMMY) CHEW Chew 2 tablets by mouth daily.   04/18/2017 at Unknown time  . oxyCODONE-acetaminophen (PERCOCET) 5-325 MG tablet Take one or two tablets by mouth every 6 hours as needed for pain. 32 tablet 0 04/18/2017 at Unknown time  . acetaminophen (TYLENOL) 325 MG tablet Take 2 tablets (650 mg total) by mouth every 4 (four) hours as needed for headache or mild pain.   More than a month at Unknown time  . linaclotide (LINZESS) 290 MCG CAPS capsule Take 290 mcg by mouth daily as needed (constipation).   More than a month at Unknown time    Assessment: 52 yo male s/p LVAD implantation 12/27.  Initiated on coumadin 12/28, then stopped after 2 doses due to shock liver/rising INR. INR back in goal range 1/6 and coumadin resumed per pharmacy dosing.  INR remains below goal at  1.65. Hgb stable 8.7. LDH stable 405. No bleeding reported. With subtherapeutic INR, pharmacy asked to start IV heparin. Last SQ dose at 1328. Previously therapeutic on 1200 units with levels ~ 0.4.  Heparin level this evening is therapeutic at 0.54   Goal of Therapy:  Heparin level 0.3-0.7 units/ml Monitor platelets by anticoagulation protocol: Yes   Plan:  Continue heparin infusion at 1200 units/hr Check anti-Xa level in 6 hours and daily while on heparin Continue to monitor H&H and platelets   Thank you for allowing [place] to participate in this patients care.  Jens Som, PharmD Main pharmacy at: [phone] 05/03/2017 10:27 PM

## 2017-05-04 DIAGNOSIS — Z95811 Presence of heart assist device: Secondary | ICD-10-CM

## 2017-05-04 DIAGNOSIS — I5023 Acute on chronic systolic (congestive) heart failure: Secondary | ICD-10-CM

## 2017-05-04 LAB — PROTIME-INR
INR: 1.91
PROTHROMBIN TIME: 21.8 s — AB (ref 11.4–15.2)

## 2017-05-04 LAB — RENAL FUNCTION PANEL
ALBUMIN: 1.7 g/dL — AB (ref 3.5–5.0)
ANION GAP: 6 (ref 5–15)
Albumin: 1.6 g/dL — ABNORMAL LOW (ref 3.5–5.0)
Anion gap: 5 (ref 5–15)
BUN: 7 mg/dL (ref 6–20)
BUN: 11 mg/dL (ref 6–20)
CHLORIDE: 100 mmol/L — AB (ref 101–111)
CO2: 26 mmol/L (ref 22–32)
CO2: 26 mmol/L (ref 22–32)
CREATININE: 1.77 mg/dL — AB (ref 0.61–1.24)
Calcium: 7.6 mg/dL — ABNORMAL LOW (ref 8.9–10.3)
Calcium: 7.8 mg/dL — ABNORMAL LOW (ref 8.9–10.3)
Chloride: 97 mmol/L — ABNORMAL LOW (ref 101–111)
Creatinine, Ser: 2.52 mg/dL — ABNORMAL HIGH (ref 0.61–1.24)
GFR calc Af Amer: 32 mL/min — ABNORMAL LOW (ref >60)
GFR calc non Af Amer: 42 mL/min — ABNORMAL LOW (ref >60)
GFR calc non Af Amer: 28 mL/min — ABNORMAL LOW (ref >60)
GFR, EST AFRICAN AMERICAN: 49 mL/min — AB (ref >60)
GLUCOSE: 92 mg/dL (ref 65–99)
Glucose, Bld: 83 mg/dL (ref 65–99)
PHOSPHORUS: 3.4 mg/dL (ref 2.5–4.6)
POTASSIUM: 4.5 mmol/L (ref 3.5–5.1)
Phosphorus: 2.5 mg/dL (ref 2.5–4.6)
Potassium: 3.9 mmol/L (ref 3.5–5.1)
Sodium: 131 mmol/L — ABNORMAL LOW (ref 135–145)
Sodium: 129 mmol/L — ABNORMAL LOW (ref 135–145)

## 2017-05-04 LAB — CBC
HCT: 27.2 % — ABNORMAL LOW (ref 39.0–52.0)
Hemoglobin: 8.4 g/dL — ABNORMAL LOW (ref 13.0–17.0)
MCH: 25.4 pg — AB (ref 26.0–34.0)
MCHC: 30.9 g/dL (ref 30.0–36.0)
MCV: 82.2 fL (ref 78.0–100.0)
PLATELETS: 361 10*3/uL (ref 150–400)
RBC: 3.31 MIL/uL — AB (ref 4.22–5.81)
RDW: 22 % — ABNORMAL HIGH (ref 11.5–15.5)
WBC: 19.3 10*3/uL — AB (ref 4.0–10.5)

## 2017-05-04 LAB — HEPARIN LEVEL (UNFRACTIONATED): Heparin Unfractionated: 0.74 IU/mL — ABNORMAL HIGH (ref 0.30–0.70)

## 2017-05-04 LAB — LACTATE DEHYDROGENASE: LDH: 376 U/L — ABNORMAL HIGH (ref 98–192)

## 2017-05-04 LAB — COOXEMETRY PANEL
CARBOXYHEMOGLOBIN: 2.7 % — AB (ref 0.5–1.5)
METHEMOGLOBIN: 0.9 % (ref 0.0–1.5)
O2 Saturation: 50.9 %
Total hemoglobin: 9 g/dL — ABNORMAL LOW (ref 12.0–16.0)

## 2017-05-04 LAB — MAGNESIUM: MAGNESIUM: 2.1 mg/dL (ref 1.7–2.4)

## 2017-05-04 LAB — BRAIN NATRIURETIC PEPTIDE: B Natriuretic Peptide: 597.9 pg/mL — ABNORMAL HIGH (ref 0.0–100.0)

## 2017-05-04 MED ORDER — MEROPENEM 1 G IV SOLR: 1.0000 g | INTRAVENOUS | Status: DC

## 2017-05-04 MED ORDER — WARFARIN SODIUM 1 MG PO TABS
1.0000 mg | ORAL_TABLET | Freq: Once | ORAL | Status: AC
  Administered 2017-05-04: 1 mg via ORAL
  Filled 2017-05-04: qty 1

## 2017-05-04 MED ORDER — OXYMETAZOLINE HCL 0.05 % NA SOLN
2.0000 | Freq: Two times a day (BID) | NASAL | Status: AC | PRN
  Filled 2017-05-04: qty 15

## 2017-05-04 NOTE — Progress Notes (Signed)
Patient ID: David [name], male   DOB: 1965-12-02, 52 y.o.   MRN: [id number] HeartMate 3 Rounding Note  Subjective:    No specific complaints. Feels well Remains on dobutamine 6 Co-ox 51 this am  CVP 9 Remains on CRRT. Negative 2L yesterday   LVAD INTERROGATION:  HeartMate IIl LVAD:  Flow 4.4 liters/min, speed 5300, power 4, PI 5.    Objective:    Vital Signs:   Temp:  [97.2 F (36.2 C)-98.1 F (36.7 C)] 97.5 F (36.4 C) (01/10 0300) Pulse Rate:  [30-100] 89 (01/10 0700) Resp:  [0-29] 14 (01/10 0700) BP: (70-104)/(45-92) 93/69 (01/10 0600) SpO2:  [91 %-100 %] 99 % (01/10 0700) Weight:  [72.6 kg (160 lb 0.9 oz)] 72.6 kg (160 lb 0.9 oz) (01/10 0500) Last BM Date: 05/03/17 Mean arterial Pressure 80's  Intake/Output:   Intake/Output Summary (Last 24 hours) at 05/04/2017 0745 Last data filed at 05/04/2017 0700 Gross per 24 hour  Intake 993.58 ml  Output 3172 ml  Net -2178.42 ml     Physical Exam: General:  Chronically ill-appearing but awake and alert. Up in chair since 6 am. No resp difficulty HEENT: abrasions on nose from tape healing Neck: left IJ central line Cor: Normal heart sounds with LVAD hum present. Lungs: clear Abdomen: soft, nontender, nondistended. Good bowel sounds. Extremities: minimal edema in arms Neuro: alert & orientedx3,  moves all 4 extremities w/o difficulty. Affect pleasant  Telemetry: sinus rhythm 86  Labs: Basic Metabolic Panel: Recent Labs  Lab 04/30/17 0525  05/01/17 0408  05/02/17 0335 05/02/17 1547 05/03/17 0335 05/03/17 1553 05/04/17 0402  NA 129*   < > 132*  132*   < > 129* 132* 131* 132* 131*  K 4.2   < > 4.4  4.4   < > 4.4 4.7 4.3 4.1 3.9  CL 95*   < > 98*  98*   < > 97* 98* 99* 99* 100*  CO2 26   < > 27  27   < > 26 27 27 26 26   GLUCOSE 109*   < > 104*  102*   < > 94 89 78 102* 83  BUN 11   < > 10  10   < > 10 11 11 7 7   CREATININE 1.67*   < > 1.93*  1.88*   < > 1.93* 1.84* 1.84* 1.82* 1.77*  CALCIUM 7.9*   < >  7.9*  7.9*   < > 7.8* 8.0* 8.0* 7.8* 7.6*  MG 2.4  --  2.4  --  2.3  --  2.3  --  2.1  PHOS  --    < > 2.2*   < > 2.2* 2.3* 2.1* 3.1 2.5   < > = values in this interval not displayed.    Liver Function Tests: Recent Labs  Lab 04/28/17 0400  04/29/17 0353  04/30/17 0525  05/01/17 0408  05/02/17 0335 05/02/17 1547 05/03/17 0335 05/03/17 1553 05/04/17 0402  AST 119*  --  99*  --  109*  --  96*  --  92*  --   --   --   --   ALT 112*  --  92*  --  88*  --  74*  --  66*  --   --   --   --   ALKPHOS 209*  --  253*  --  397*  --  423*  --  458*  --   --   --   --  BILITOT 10.7*  --  9.2*  --  8.5*  --  6.1*  --  5.2*  --   --   --   --   PROT 6.5  --  6.5  --  6.7  --  6.8  --  6.4*  --   --   --   --   ALBUMIN 1.8*   < > 1.8*  1.8*   < > 1.7*   < > 1.8*  1.7*   < > 1.6* 1.8* 1.7* 1.7* 1.6*   < > = values in this interval not displayed.   No results for input(s): LIPASE, AMYLASE in the last 168 hours. No results for input(s): AMMONIA in the last 168 hours.  CBC: Recent Labs  Lab 04/28/17 0400 04/30/17 0525 05/02/17 0335 05/02/17 1321 05/03/17 0335 05/04/17 0402  WBC 21.4* 21.8*  --  22.6* 20.0* 19.3*  HGB 8.9* 8.5* 7.6* 9.3* 8.7* 8.4*  HCT 27.7* 26.1* 23.9* 28.7* 27.6* 27.2*  MCV 77.8* 78.1  --  80.6 80.7 82.2  PLT 181 265  --  321 336 361    INR: Recent Labs  Lab 04/30/17 0525 05/01/17 0408 05/02/17 0335 05/03/17 0335 05/04/17 0402  INR 2.39 2.23 1.70 1.65 1.91    Other results:  EKG:   Imaging: No results found.   Medications:     Scheduled Medications: . aspirin EC  81 mg Oral Daily  . chlorhexidine  15 mL Mouth Rinse BID  . Chlorhexidine Gluconate Cloth  6 each Topical Daily  . feeding supplement (ENSURE ENLIVE)  237 mL Oral TID BM  . mouth rinse  15 mL Mouth Rinse q12n4p  . pantoprazole  40 mg Oral Daily  . Warfarin - Pharmacist Dosing Inpatient   Does not apply q1800    Infusions: . sodium chloride Stopped (04/30/17 0800)  . sodium  chloride    . DOBUTamine 6 mcg/kg/min (05/03/17 1616)  . heparin 1,100 Units/hr (05/04/17 0500)  . meropenem (MERREM) IV Stopped (05/04/17 0531)  . norepinephrine (LEVOPHED) Adult infusion Stopped (05/01/17 2316)  . dialysis replacement fluid (prismasate) 400 mL/hr at 05/04/17 0212  . dialysis replacement fluid (prismasate) 200 mL/hr at 05/04/17 0221  . dialysate (PRISMASATE) 1,700 mL/hr at 05/04/17 0517  . sodium chloride      PRN Medications: heparin, levalbuterol, morphine injection, ondansetron (ZOFRAN) IV, oxyCODONE, simethicone, sodium chloride, sodium chloride flush, sodium chloride flush   Assessment:   1. Ischemic cardiomyopathy s/p multiple PCI's with chronic systolic heart failure and EF 20% on home dobutamine preop with significant improvement in symptoms. Evaluated at [facility] for heart transplant but LVAD felt to be the best treatment for him at this time. POD15s/p HeartMate III LVAD. He is critically ill with multi-system organ failure but improving.Hemodynamics stable on dobut 6. I think this can be very slowly weaned. His Co-ox is only 50 but I think this is due to anemia and lower PO2. 2.H/OHypertension 3.H/OHyperlipidemia 4. PAF in sinuspreopon amiodarone and Eliquis.Now back in sinus off amiodarone.  5.H/OStage III CKD. Acute on chronic postop renal failurewith anuria now on CRRT. He is making a little urine so still hopeful for recovery. Plan to convert to intermittent HD today. 6. LBBB s/p CRT-D upgrade in 10/2016 without improvement in symptoms or EF. LV epicardial leads cut off at the time of surgery. 7. Prior smoking with mild obstruction and severe diffusion defect on PFT's. CXRhas been stable. Continue IS. 8. Postop leukocytosis and fever. WBC down slightly more today. Sputum  culture grew a few achromobacter resistant to Maxipime so broadened to meropenem.  9. Marked rise in bilirubin and transaminases. Likely due to pump runand right sided  congestion, hypotension.Bili and transaminases improving. 10. INR is1.65 today. Coumadin per pharmacy. Got 2 mg last night and on subQ heparin.    11. Severe malnutrition with albumin 1.6. Taking PO fairly well. Continue to encourage nutrition.    12. PT    13. Anemia: drop in Hgb most likely due to blood draws and loss in CRRT filters. No visible blood loss otherwise. Follow. Probably benefit from some iron.    Plan/Discussion:    Overall he has been stable. MAP in good range off pressors. Slow dobutamine wean.  VAD flow has been great. I think he is euvolemic to dry with CVP 9 and no peripheral edema so could keep I/O even to slightly negative. Convert to intermittent HD. Will need to decide about tunneled HD catheter. Temporary catheter has been in for a while. Continue to mobilize and encourage nutrition.  I reviewed the LVAD parameters from today, and compared the results to the patient's prior recorded data.  No programming changes were made.  The LVAD is functioning within specified parameters.   LVAD interrogation was negative for any significant power changes, alarms or PI events/speed drops.  LVAD equipment check completed and is in good working order.  Back-up equipment present.   LVAD education done on emergency procedures and precautions and reviewed exit site care.  Length of Stay: [address]  Fernande Boyden [facility] 05/04/2017, 7:45 AM

## 2017-05-04 NOTE — Progress Notes (Signed)
[place] for Coumadin; add Heparin Indication: LVAD  Allergies  Allergen Reactions  . Plavix [Clopidogrel Bisulfate] Hives    Patient Measurements: Height: 5\' 5"  (165.1 cm) Weight: 157 lb 13.6 oz (71.6 kg) IBW/kg (Calculated) : 61.5 Heparin Dosing Weight: n/a  Vital Signs: Temp: 97.5 F (36.4 C) (01/10 0300) Temp Source: Oral (01/10 0300) BP: 100/76 (01/10 0400) Pulse Rate: 89 (01/10 0400)  Labs: Recent Labs    05/02/17 0335 05/02/17 1321 05/02/17 1547 05/03/17 0335 05/03/17 1553 05/03/17 2139 05/04/17 0402  HGB 7.6* 9.3*  --  8.7*  --   --  8.4*  HCT 23.9* 28.7*  --  27.6*  --   --  27.2*  PLT  --  321  --  336  --   --  361  LABPROT 19.8*  --   --  19.4*  --   --  21.8*  INR 1.70  --   --  1.65  --   --  1.91  HEPARINUNFRC  --   --   --   --   --  0.54 0.74*  CREATININE 1.93*  --  1.84* 1.84* 1.82*  --   --     Estimated Creatinine Clearance: 40.8 mL/min (A) (by C-G formula based on SCr of 1.82 mg/dL (H)).   Assessment: 52 yo male s/p LVAD implantation 12/27.  Initiated on coumadin 12/28, then stopped after 2 doses due to shock liver/rising INR. INR back in goal range 1/6 and coumadin resumed per pharmacy dosing.  INR remains below goal at 1.65. Hgb stable 8.7. LDH stable 405. No bleeding reported.  Heparin level is slightly high.  Goal of Therapy:  INR 2-2.5 Monitor platelets by anticoagulation protocol: Yes    Plan: -Reduce heparin to 1100 units/hr -Daily HL, CBC -Check 6 hr level    David [name]  05/04/2017 4:59 AM

## 2017-05-04 NOTE — Progress Notes (Signed)
[name] with sCHF and ICM s/p HM3 LVAD 33/43 complicated by shock on pressors, respoiratory failure    Patient is on CRRT and appears to be tolerating fine - discussed management this morning with Dr Cyndia Bent -- interested in transitioning to intermittent hemodialysis to help with progression Will transition to intermittent dialysis  No appreciable urine output   Inflammatory markers improving     Objective:  Vital signs in last 24 hours:  Temp:  [97.4 F (36.3 C)-98.4 F (36.9 C)] 97.9 F (36.6 C) (01/07 0746) Pulse Rate:  [30-106] 35 (01/07 0900) Resp:  [19-37] 24 (01/07 0900) BP: (83-112)/(54-87) 97/75 (01/07 0800) SpO2:  [77 %-100 %] 77 % (01/07 0900) Weight:  [156 lb 4.9 oz (70.9 kg)] 156 lb 4.9 oz (70.9 kg) (01/07 0500)  Weight change: -15.7 oz (-0.9 kg)      Filed Weights   04/29/17 0433 04/30/17 0500 05/01/17 0500  Weight: 154 lb 15.7 oz (70.3 kg) 158 lb 4.6 oz (71.8 kg) 156 lb 4.9 oz (70.9 kg)    Intake/Output: I/O last 3 completed shifts: In: 2950.8 [P.O.:1580; I.V.:1070.8; IV Piggyback:300] Out: 5002 [Urine:200; Other:4802]   Intake/Output this shift:  Total I/O In: 172.4 [P.O.:120; I.V.:52.4] Out: 200 [Urine:50; Other:150]  CVS- RRRMechanical sound of LVAD RS- CTA ABD- BS present soft non-distended EXT- no edema     Basic Metabolic Panel: LastLabs             Recent Labs  Lab 04/27/17 0349  04/28/17 0400 04/28/17 1630 04/29/17 0353 04/29/17 2030 04/30/17 0525 04/30/17 1617 05/01/17 0408  NA 136   < > 136 135 135  135 129* 129* 130* 132*  132*  K 4.0   < > 4.0 4.0 4.1  4.0 4.3 4.2 4.5 4.4  4.4  CL 102   < > 103 101 100*  101 97* 95* 98* 98*  98*  CO2 24   < > 26 26 27  26 25 26 25 27  27   GLUCOSE 80   < > 96 124* 100*  101* 98 109* 104* 104*  102*  BUN 16   < > 13 12 13  12 10 11 9 10  10   CREATININE 1.64*   < > 1.69* 1.61* 1.68*  1.72* 1.65* 1.67* 1.76* 1.93*  1.88*  CALCIUM 8.1*   < > 8.0* 8.0* 7.8*  8.3* 7.6*  7.9* 7.9* 7.9*  7.9*  MG 2.6*  --  2.6*  --  2.3  --  2.4  --  2.4  PHOS 2.2*   < > 2.3* 1.9* 2.8 2.4*  --  2.2* 2.2*   < > = values in this interval not displayed.      Liver Function Tests: LastLabs              Recent Labs  Lab 04/27/17 0349 04/27/17 1050  04/28/17 0400  04/29/17 0353 04/29/17 2030 04/30/17 0525 04/30/17 1617 05/01/17 0408  AST 149*  --   --  119*  --  99*  --  109*  --  96*  ALT 144*  --   --  112*  --  92*  --  88*  --  74*  ALKPHOS 191*  --   --  209*  --  253*  --  397*  --  423*  BILITOT 10.7* 10.8*  --  10.7*  --  9.2*  --  8.5*  --  6.1*  PROT 6.5  --   --  6.5  --  6.5  --  6.7  --  6.8  ALBUMIN 1.9*  --    < > 1.8*   < > 1.8*  1.8* 1.8* 1.7* 1.8* 1.8*  1.7*   < > = values in this interval not displayed.     LastLabs  No results for input(s): LIPASE, AMYLASE in the last 168 hours.   LastLabs  No results for input(s): AMMONIA in the last 168 hours.    CBC: LastLabs         Recent Labs  Lab 04/25/17 0359 04/26/17 0343 04/27/17 0349 04/28/17 0400 04/30/17 0525  WBC 14.3* 13.5* 19.0* 21.4* 21.8*  NEUTROABS 11.8* 11.2* 16.6*  --   --   HGB 8.1* 8.9* 9.2* 8.9* 8.5*  HCT 25.1* 26.9* 28.7* 27.7* 26.1*  MCV 77.5* 78.4 77.2* 77.8* 78.1  PLT 118* 114* 133* 181 265      Cardiac Enzymes: LastLabs  No results for input(s): CKTOTAL, CKMB, CKMBINDEX, TROPONINI in the last 168 hours.    BNP: LastLabs  Invalid input(s): POCBNP    CBG: LastLabs         Recent Labs  Lab 04/26/17 1643 04/26/17 1951 04/26/17 2349 04/27/17 0406 04/27/17 0754  GLUCAP 78 82 104* 34 75      Microbiology:        Results for orders placed or performed during the hospital encounter of 04/19/17  Surgical pcr screen     Status: None   Collection Time: 04/19/17  1:58 PM  Result Value Ref Range Status   MRSA, PCR NEGATIVE NEGATIVE Final   Staphylococcus aureus NEGATIVE NEGATIVE Final    Comment: (NOTE) The Xpert SA  Assay (FDA approved for NASAL specimens in patients 70 years of age and older), is one component of a comprehensive surveillance program. It is not intended to diagnose infection nor to guide or monitor treatment.   Culture, blood (routine x 2)     Status: None   Collection Time: 04/22/17 11:25 AM  Result Value Ref Range Status   Specimen Description BLOOD RIGHT ARM  Final   Special Requests IN PEDIATRIC BOTTLE Blood Culture adequate volume  Final   Culture NO GROWTH 5 DAYS  Final   Report Status 04/27/2017 FINAL  Final  Culture, blood (routine x 2)     Status: None   Collection Time: 04/22/17 11:36 AM  Result Value Ref Range Status   Specimen Description BLOOD RIGHT HAND  Final   Special Requests IN PEDIATRIC BOTTLE Blood Culture adequate volume  Final   Culture NO GROWTH 5 DAYS  Final   Report Status 04/27/2017 FINAL  Final  Culture, respiratory (NON-Expectorated)     Status: None   Collection Time: 04/24/17  8:16 AM  Result Value Ref Range Status   Specimen Description TRACHEAL ASPIRATE  Final   Special Requests NONE  Final   Gram Stain   Final    MODERATE WBC PRESENT,BOTH PMN AND MONONUCLEAR NO ORGANISMS SEEN    Culture FEW ACHROMOBACTER SPECIES  Final   Report Status 04/27/2017 FINAL  Final   Organism ID, Bacteria ACHROMOBACTER SPECIES  Final      Susceptibility   Achromobacter species - MIC*    CEFEPIME >=64 RESISTANT Resistant     CEFAZOLIN >=64 RESISTANT Resistant     GENTAMICIN 4 SENSITIVE Sensitive     CIPROFLOXACIN 2 INTERMEDIATE Intermediate     IMIPENEM 1 SENSITIVE Sensitive     TRIMETH/SULFA <=20 SENSITIVE Sensitive     * FEW ACHROMOBACTER SPECIES  Urine Culture     Status: None   Collection Time: 04/25/17 12:38 PM  Result Value Ref Range Status   Specimen Description URINE, CATHETERIZED  Final   Special Requests Normal  Final   Culture NO GROWTH  Final   Report Status 04/26/2017 FINAL  Final     Coagulation Studies: RecentLabs(last2labs)       Recent Labs    04/29/17 0353 04/30/17 0525 05/01/17 0408  LABPROT 32.2* 25.9* 24.5*  INR 3.16 2.39 2.23      Urinalysis:  RecentLabs(last2labs)  No results for input(s): COLORURINE, LABSPEC, PHURINE, GLUCOSEU, HGBUR, BILIRUBINUR, KETONESUR, PROTEINUR, UROBILINOGEN, NITRITE, LEUKOCYTESUR in the last 72 hours.  Invalid input(s): APPERANCEUR      Imaging: ImagingResults(Last48hours)  No results found.     Medications:   . sodium chloride Stopped (04/30/17 0800)  . DOBUTamine 6 mcg/kg/min (05/01/17 0700)  . EPINEPHrine 4 mg in dextrose 5% 250 mL infusion (16 mcg/mL) 4 mcg/min (05/01/17 0700)  . meropenem (MERREM) IV Stopped (05/01/17 0458)  . norepinephrine (LEVOPHED) Adult infusion 4 mcg/min (05/01/17 0859)  . dialysis replacement fluid (prismasate) 400 mL/hr at 04/30/17 2116  . dialysis replacement fluid (prismasate) 200 mL/hr at 04/30/17 2215  . dialysate (PRISMASATE) 1,700 mL/hr at 05/01/17 0510  . sodium chloride     . aspirin EC  81 mg Oral Daily  . chlorhexidine  15 mL Mouth Rinse BID  . Chlorhexidine Gluconate Cloth  6 each Topical Daily  . feeding supplement (ENSURE ENLIVE)  237 mL Oral TID BM  . heparin injection (subcutaneous)  5,000 Units Subcutaneous Q8H  . mouth rinse  15 mL Mouth Rinse q12n4p  . pantoprazole  40 mg Oral Daily  . warfarin  1 mg Oral ONCE-1800  . Warfarin - Physician Dosing Inpatient   Does not apply q1800   heparin, levalbuterol, morphine injection, ondansetron (ZOFRAN) IV, oxyCODONE, sodium chloride, sodium chloride flush, sodium chloride flush  Assessment/ Plan:  1. 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 Pigment nephropathy  2. ICM s/p LVAD 12/27; on dobutamine  Weaning  3. Shock off NE/Epi per AHF + TCTS weaning pressors 4. VDRF, now extubated 5. Hypophosphatemia, repleted 1/3;monitoring, now  on diet might stablized [name]/o further IV phos 6. Elevated bilirubin; elevated transaminates, improving 7. Leukocytosis, +ancrhomobacter on trach aspirate 12/31 on meropenem; 12/31 B Cx x2 NGF 8. Anemia transfusing per AHF  P  1. Ready to transition to iHD; no evidence of GFR recovery although will  monitor   Seen on CRRT   --  Discussed with Dr Cyndia Bent and will aim to transition to IHD     LOS: 12 David [name] @TODAY @9 :02 AM

## 2017-05-04 NOTE — Progress Notes (Signed)
PHARMACY NOTE:  ANTIMICROBIAL RENAL DOSAGE ADJUSTMENT  Current antimicrobial regimen includes a mismatch between antimicrobial dosage and estimated renal function.  As per policy approved by the Pharmacy & Therapeutics and Medical Executive Committees, the antimicrobial dosage will be adjusted accordingly.  Current antimicrobial dosage: Meropenem 1000mg  IV Q12h  Indication: Sepsis  Renal Function:  Estimated Creatinine Clearance: 42 mL/min (A) (by C-G formula based on SCr of 1.77 mg/dL (H)). [x]      On intermittent HD, scheduled: unknown, transitioned off of CRRT 05/04/17 []      On CRRT    Antimicrobial dosage has been changed to:  Meropenem 500mg  IV Q24hrs  Additional comments: 1000mg  of meropenem was given ~6 hours prior to transitioning off of CRRT. I estimate ~1/2 of the dose would have been removed, so I did not re-dose this afternoon. A 500mg  dose has been placed for 05/05/17 and will be given if HD is done.   Thank you for allowing pharmacy to be a part of this patient's care.   Patterson Hammersmith PharmD PGY1 Pharmacy Practice Resident 05/04/2017 3:43 PM Pager: [phone]

## 2017-05-04 NOTE — Progress Notes (Signed)
CSW met at bedside with patient and wife. Patient states he is not feeling as well today but trying to eat to gain strength. Patient will meet with PT later today and encouraged to participate. CSW provided supportive intervention and encouragement to patient and wife. CSW will continue to follow for supportive needs throughout implant hospitalization. Jackie , LCSW, CCSW-MCS [phone]  

## 2017-05-04 NOTE — Progress Notes (Signed)
Patient ID: David [name], male   DOB: 11-06-1965, 52 y.o.   MRN: [id number]   Advanced Heart Failure VAD Team Note  Subjective:    David [name] is a 52 y.o. male with a PMH of ICM ('09 PCI to LAD, mRCA, '10 PCI to Lcx and '12 mRCA), HFrEF (EF ~20% for >5 years), HLD and HTN admitted 12/26 for VAD placement due to end-stage systolic HF.   Underwent successful HM-3 LVAD placement 04/20/17.  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   -Extubated 12/28.  -Developed AKI. CVVHD started 12/30 -ReIntubated 04/23/17  -Developed progressive shock with uptitration of norepinephrine and epinephrine and addition of vasopressin.  Milrinone stopped and dobutamine begun.  CXR with CHF.   -Re-extubated 04/26/2017 -Started on Meropenem 04/27/16 with + Achromobacter in tracheal aspirate.  Yesterday dobutamine cut back to 5 mcg however maps dropped. Increased back to 6 mcg dobutamine. Todays CO-OX 50%.   Overall feeling ok. Denies SOB. Plan to transition to IHD today  LDH 376 INR 1.9   Echo 12/31 reviewed: No significant pericardial effusion, IV septum midline to slightly to the right, RV function adequate.   LVAD INTERROGATION:  HeartMate III LVAD:  Flow  4.3   liters/min, speed5300, power 4, PI 5   Objective:    Vital Signs:   Temp:  [97.2 F (36.2 C)-98.1 F (36.7 C)] 97.9 F (36.6 C) (01/10 0802) Pulse Rate:  [30-100] 89 (01/10 0700) Resp:  [0-29] 14 (01/10 0700) BP: (70-104)/(45-92) 93/69 (01/10 0600) SpO2:  [91 %-100 %] 99 % (01/10 0700) Weight:  [160 lb 0.9 oz (72.6 kg)] 160 lb 0.9 oz (72.6 kg) (01/10 0500) Last BM Date: 05/03/17 Mean arterial Pressure 70-80s   Intake/Output:   Intake/Output Summary (Last 24 hours) at 05/04/2017 0914 Last data filed at 05/04/2017 0700 Gross per 24 hour  Intake 987.08 ml  Output 2942 ml  Net -1954.92 ml     Physical Exam  Physical Exam: CVP 11-12  GENERAL: No acute distress. Sitting in the chair.  HEENT: normal  NECK: Supple, JVP  to jaw  2+ bilaterally, no bruits.  No lymphadenopathy or thyromegaly appreciated.  RIJU HD LIJ.  CARDIAC:  Mechanical heart sounds with LVAD hum present.  LUNGS:  Clear to auscultation bilaterally.  ABDOMEN:  Soft, round, nontender, positive bowel sounds x4.     LVAD exit site:  Dressing dry and intact.  No erythema or drainage.  Stabilization device present and accurately applied.  Driveline dressing is being changed daily per sterile technique. EXTREMITIES:  Warm and dry, no cyanosis, clubbing, rash or edema  NEUROLOGIC:  Alert and oriented x 4.  Gait steady.  No aphasia.  No dysarthria.  Affect pleasant.        Telemetry   NSR 80s personally reviewed.   Labs   Basic Metabolic Panel: Recent Labs  Lab 04/30/17 0525  05/01/17 0408  05/02/17 0335 05/02/17 1547 05/03/17 0335 05/03/17 1553 05/04/17 0402  NA 129*   < > 132*  132*   < > 129* 132* 131* 132* 131*  K 4.2   < > 4.4  4.4   < > 4.4 4.7 4.3 4.1 3.9  CL 95*   < > 98*  98*   < > 97* 98* 99* 99* 100*  CO2 26   < > 27  27   < > 26 27 27 26 26   GLUCOSE 109*   < > 104*  102*   < > 94 89 78 102*  83  BUN 11   < > 10  10   < > 10 11 11 7 7   CREATININE 1.67*   < > 1.93*  1.88*   < > 1.93* 1.84* 1.84* 1.82* 1.77*  CALCIUM 7.9*   < > 7.9*  7.9*   < > 7.8* 8.0* 8.0* 7.8* 7.6*  MG 2.4  --  2.4  --  2.3  --  2.3  --  2.1  PHOS  --    < > 2.2*   < > 2.2* 2.3* 2.1* 3.1 2.5   < > = values in this interval not displayed.    Liver Function Tests: Recent Labs  Lab 04/28/17 0400  04/29/17 0353  04/30/17 0525  05/01/17 0408  05/02/17 0335 05/02/17 1547 05/03/17 0335 05/03/17 1553 05/04/17 0402  AST 119*  --  99*  --  109*  --  96*  --  92*  --   --   --   --   ALT 112*  --  92*  --  88*  --  74*  --  66*  --   --   --   --   ALKPHOS 209*  --  253*  --  397*  --  423*  --  458*  --   --   --   --   BILITOT 10.7*  --  9.2*  --  8.5*  --  6.1*  --  5.2*  --   --   --   --   PROT 6.5  --  6.5  --  6.7  --  6.8  --  6.4*  --    --   --   --   ALBUMIN 1.8*   < > 1.8*  1.8*   < > 1.7*   < > 1.8*  1.7*   < > 1.6* 1.8* 1.7* 1.7* 1.6*   < > = values in this interval not displayed.   No results for input(s): LIPASE, AMYLASE in the last 168 hours. No results for input(s): AMMONIA in the last 168 hours.  CBC: Recent Labs  Lab 04/28/17 0400 04/30/17 0525 05/02/17 0335 05/02/17 1321 05/03/17 0335 05/04/17 0402  WBC 21.4* 21.8*  --  22.6* 20.0* 19.3*  HGB 8.9* 8.5* 7.6* 9.3* 8.7* 8.4*  HCT 27.7* 26.1* 23.9* 28.7* 27.6* 27.2*  MCV 77.8* 78.1  --  80.6 80.7 82.2  PLT 181 265  --  321 336 361    INR: Recent Labs  Lab 04/30/17 0525 05/01/17 0408 05/02/17 0335 05/03/17 0335 05/04/17 0402  INR 2.39 2.23 1.70 1.65 1.91    Other results:  Imaging   No results found.   Medications:     Scheduled Medications: . aspirin EC  81 mg Oral Daily  . chlorhexidine  15 mL Mouth Rinse BID  . Chlorhexidine Gluconate Cloth  6 each Topical Daily  . feeding supplement (ENSURE ENLIVE)  237 mL Oral TID BM  . mouth rinse  15 mL Mouth Rinse q12n4p  . pantoprazole  40 mg Oral Daily  . Warfarin - Pharmacist Dosing Inpatient   Does not apply q1800    Infusions: . sodium chloride Stopped (04/30/17 0800)  . sodium chloride    . DOBUTamine 6 mcg/kg/min (05/03/17 1616)  . meropenem (MERREM) IV Stopped (05/04/17 0531)  . dialysis replacement fluid (prismasate) 400 mL/hr at 05/04/17 0212  . dialysis replacement fluid (prismasate) 200 mL/hr at 05/04/17 0221  . dialysate (PRISMASATE) 1,700 mL/hr at  05/04/17 0517  . sodium chloride      PRN Medications: heparin, levalbuterol, morphine injection, ondansetron (ZOFRAN) IV, oxyCODONE, simethicone, sodium chloride, sodium chloride flush, sodium chloride flush   Patient Profile   David [name] is a 52 y.o. male with a PMH of ICM ('09 PCI to LAD, mRCA, '10 PCI to Lcx and '12 mRCA), HFrEF (EF ~20% for >5 years) s/p HM-III VAD placement on 12/27 due to end-stage systolic  HF.   Assessment/Plan:    1. Acute on chronic systolic HF: Echo 19/62/22 with LVEF 20-25%, Mild MR, Mild/Mod MR, Severe LAE, Mildly reduced RV, PA peak pressure 56 mm Hg.  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 Extubated, initially stopped epinephrine/norepinephrine but restarted with fall in UOP and MAP.  With progressive renal failure, pulmonary edema, and hypotension, he was re-intubated on 12/30.   CVVH was begun.  12/31 was difficult day with refractory shock (MAP in 60s).  Reviewed 12/31 echo which showed no significant pericardial effusion, adequate RV function, and IV septum near mid-line. Given suspected for vasodilatory/septic shock with PCT 31 04/24/17, he is covered with cefepime.   - Off NE/Epi. Remains on dobutamine 6 mcg.  CO-OX 50%.  -  Continue CVVH running pulling 50cc  - Cotninue aspirin 81 mg daily.  - Warfarin goal INR 2-2.5. Stop IV heparin today. INR up to 1.9. Continue coumadin. Discussed with pharmacy.   2. CAD s/p PCI to mid RCA and PLOM with DES x 2 and DES to mid LAD in 1/18.   - No s/s ischemia.  - Continue ASA and statin. No change.   3. PAF: Remains in NSR  Stop heparin. On coumadin. INR 1.9  4. ARF on CKD stage III: Suspect some degree of intrinsic renal disease prior to LVAD placement (baseline creatinine around 1.5) with probable ATN from intra-op/peri-op hypotension and development of vasodilatory/septic shock.   - Remains on CVVHD. Plans to stop today.    - - Appreciate Renal in put.  5. Anemia expected:    s/p 1 u PRBCs 12/30, 04/25/17. 1/8.  -Hgb 8.4 . Watch closely.  6. ID: Started on cefepime empirically with septic/vasodilatory shock, PCT was 31 04/25/17 Cultures (blood, sputum) NGTD   - Improving. PCT now coming down. WBC stable at 21k - ABX broadened to Meropenem 04/27/16 with + Achromobacter on tracheal aspirate.  - PCT 8.31 04/27/17 -> 3.87 ->0.94 - WBC 19.3  7. Elevated LFTs due to shock liver - Continues to  improve.     Length of Stay: [place], NP 05/04/2017, 9:14 AM  VAD Team --- VAD ISSUES ONLY--- Pager [phone] (7am - 7am)  Advanced Heart Failure Team  Pager [phone] (M-F; 7a - 4p)  Please contact [facility] for night-coverage after hours (4p -7a ) and weekends on [email or web address]  Patient seen and examined with Darrick Grinder, NP. We discussed all aspects of the encounter. I agree with the assessment and plan as stated above.   Remains tenuous but improving slowly. Remains on CVVHD with plans to transition to IHD today. On dobutamine 6. Failed dobutamine wean yesterday. Co-ox marginal. MAPs soft but ok. Will continue dobutamine at 6 for now until CVVHD off then try to wean slowly. Can use midodrine for BP support. On heparin for low INR. INR 1.9 today. Can stop heparin. D/w PharmD. Can transition to [place]. Will need ramp echo soon. Continue to mobilize. Hopefully renal function will return.   VAD interrogated personally. Parameters  stable.  Glori Bickers, MD  10:42 AM

## 2017-05-04 NOTE — Progress Notes (Signed)
OT Cancellation Note  Patient Details Name: David [name] MRN: [id number] DOB: 08-Oct-1965   Cancelled Treatment:    Reason Eval/Treat Not Completed: Patient declined, no reason specified.  Will reattempt tomorrow.   [place], OTR/L [phone]   [name] M 05/04/2017, 2:56 PM

## 2017-05-04 NOTE — Progress Notes (Signed)
Patient ID: David [name], male   DOB: [birth year], 51 y.o.   MRN: [id number] TCTS Evening Rounds:  He has been hemodynamically stable today with MAP 70's  VAD parameters stable  Had a BM today. Passed 75 cc urine.  Says he had a lot of sternal pain today. Will check a CXR in the am.

## 2017-05-04 NOTE — Progress Notes (Signed)
Exit Site Care:  Nurse pre-medicated patient with oxycodone prior to dressing change due to patient's intolerance of dressing removal due to pain.   Existing VAD dressing removed and site care performed using sterile technique. Drive line exit site cleaned with Chlora prep applicators x 2, allowed to dry, and aqaucell silver strip with daily dressing kit re-applied. Exit site is red with moderate amount sanguineous drainage; suture removed yesterday. The velour is not yet incorporated. No tenderness, foul odor or rash noted. Drive line anchors removed; replaced with one anchor.         Tanda Rockers RN, VAD Coordinator 24/7 VAD pager:  [phone]

## 2017-05-04 NOTE — Progress Notes (Signed)
ANTICOAGULATION CONSULT NOTE - Follow Up Consult  Pharmacy Consult for Coumadin Indication: LVAD  Allergies  Allergen Reactions  . Plavix [Clopidogrel Bisulfate] Hives    Patient Measurements: Height: 5\' 5"  (165.1 cm) Weight: 160 lb 0.9 oz (72.6 kg) IBW/kg (Calculated) : 61.5 Heparin Dosing Weight: n/a  Vital Signs: Temp: 99.5 F (37.5 C) (01/10 1120) Temp Source: Oral (01/10 1120) BP: 100/81 (01/10 0800) Pulse Rate: 89 (01/10 0700)  Labs: Recent Labs    05/02/17 0335 05/02/17 1321  05/03/17 0335 05/03/17 1553 05/03/17 2139 05/04/17 0402  HGB 7.6* 9.3*  --  8.7*  --   --  8.4*  HCT 23.9* 28.7*  --  27.6*  --   --  27.2*  PLT  --  321  --  336  --   --  361  LABPROT 19.8*  --   --  19.4*  --   --  21.8*  INR 1.70  --   --  1.65  --   --  1.91  HEPARINUNFRC  --   --   --   --   --  0.54 0.74*  CREATININE 1.93*  --    < > 1.84* 1.82*  --  1.77*   < > = values in this interval not displayed.    Estimated Creatinine Clearance: 42 mL/min (A) (by C-G formula based on SCr of 1.77 mg/dL (H)).   Assessment: 52 yo male s/p LVAD implantation 12/27.  Initiated on coumadin 12/28, then stopped after 2 doses due to shock liver/rising INR. INR back in goal range 1/6 and coumadin resumed per pharmacy dosing. INR trended down to 1.65 yesterday and heparin added.  Today's INR has trended back up and is almost therapeutic at 1.91. Ok to d/c heparin. CBC/LDH stable. No bleeding reported.  Goal of Therapy:  INR 2-2.5 Monitor platelets by anticoagulation protocol: Yes   Plan:  1) D/C heparin since INR > 1.8 2) Coumadin 1mg  tonight 3) Daily INR   Nena Jordan, PharmD, BCPS 05/04/2017 11:56 AM

## 2017-05-04 NOTE — Progress Notes (Signed)
PT Cancellation Note  Patient Details Name: David [name] MRN: [id number] DOB: 1966/03/20   Cancelled Treatment:    Reason Eval/Treat Not Completed: Patient declined, no reason specified.   [place] 05/04/2017, 3:00 PM [place]

## 2017-05-04 NOTE — Progress Notes (Signed)
LVAD Coordinator Rounding Note:  Admitted12/26due to decompensated heart failure.  Heartmate IIILVAD [facility].AV valve oversewn due to aortic insufficiency.  Vital signs: Tmax:97.9 HR:88 Doppler Pressure:not done Automatic cuff:  100/81 (87) O2 Sat:94 % on 2LNC Wt:165>164>156>160>160>154>158>156>157>160lbs  LVAD interrogation reveals:  Speed:5300 Flow: 4.3 Power:4.0 w PI:5.5 Alarms:none Events:no PI events over last 24 hrs Hematocrit:27 Fixed speed:5300 Low speed limit:5000  Labs:  LDH trend:205>407>576>923>967>612>528>492>467>489>441>434>405>376  INR trend:1.34>1.52>1.73>1.95>2.32>3.03>3.80>3.16>2.39>2.23>1.70>1.65>1.91  Anticoagulation Plan: -INR Goal:2-2.5  -ASA Dose: 81 mg   Blood Products: Intra op:2 units/FFP 04/20/17  Post op: PRBC's 1 unit 12/29 1 unit 12/30 1 unit 04/25/17  Device:Medtronic BiV -Therapies:off  Arrhythmia: 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 (on amiodarone and Eliquis for this) - Afib 12/29 - 04/28/17 - NSR 1/5 - present  Nitric Oxide: -  stopped on 12/30 due to initiation of BiPap  Respiratory: - Extubated 12/28 - Reintubated 04/23/2017 - Extubated 04/26/2017  Renal:  04/23/17 - CRRT started for AKI - plan to stop today (05/04/17)  Infection: - 04/27/17 + 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 VAD coordinator to change VAD dressing today.  2. Encourage patient/wife to complete reading HM III Patient Handbook. Loaned patient "reader" glasses until he can get his from home.   3. Filled out daily Logbook with patient today.  4. Pt performed controller self-test today. 5. Pt will get OOB with PT today after CVVD stopped. They will work with  him on switching power sources from battery to PM cable and back.  2. Please call VAD Pager for any equipment concerns or drive line dressing questions/concerns   Zada Girt RN, [place] Coordinator 24/7 pager [phone]

## 2017-05-05 ENCOUNTER — Inpatient Hospital Stay (HOSPITAL_COMMUNITY): Payer: Medicare HMO

## 2017-05-05 DIAGNOSIS — I10 Essential (primary) hypertension: Secondary | ICD-10-CM

## 2017-05-05 LAB — RENAL FUNCTION PANEL
ALBUMIN: 1.8 g/dL — AB (ref 3.5–5.0)
ALBUMIN: 1.6 g/dL — AB (ref 3.5–5.0)
ANION GAP: 7 (ref 5–15)
ANION GAP: 8 (ref 5–15)
BUN: 15 mg/dL (ref 6–20)
BUN: 22 mg/dL — ABNORMAL HIGH (ref 6–20)
CALCIUM: 8.3 mg/dL — AB (ref 8.9–10.3)
CALCIUM: 8.2 mg/dL — AB (ref 8.9–10.3)
CO2: 25 mmol/L (ref 22–32)
CO2: 24 mmol/L (ref 22–32)
CREATININE: 3.32 mg/dL — AB (ref 0.61–1.24)
Chloride: 96 mmol/L — ABNORMAL LOW (ref 101–111)
Chloride: 94 mmol/L — ABNORMAL LOW (ref 101–111)
Creatinine, Ser: 4.09 mg/dL — ABNORMAL HIGH (ref 0.61–1.24)
GFR calc Af Amer: 18 mL/min — ABNORMAL LOW (ref >60)
GFR calc non Af Amer: 20 mL/min — ABNORMAL LOW (ref >60)
GFR calc non Af Amer: 15 mL/min — ABNORMAL LOW (ref >60)
GFR, EST AFRICAN AMERICAN: 23 mL/min — AB (ref >60)
GLUCOSE: 89 mg/dL (ref 65–99)
GLUCOSE: 102 mg/dL — AB (ref 65–99)
PHOSPHORUS: 4.5 mg/dL (ref 2.5–4.6)
PHOSPHORUS: 5.4 mg/dL — AB (ref 2.5–4.6)
Potassium: 4.6 mmol/L (ref 3.5–5.1)
Potassium: 4.7 mmol/L (ref 3.5–5.1)
SODIUM: 128 mmol/L — AB (ref 135–145)
SODIUM: 126 mmol/L — AB (ref 135–145)

## 2017-05-05 LAB — LACTATE DEHYDROGENASE: LDH: 355 U/L — ABNORMAL HIGH (ref 98–192)

## 2017-05-05 LAB — CBC
HCT: 25.7 % — ABNORMAL LOW (ref 39.0–52.0)
HEMOGLOBIN: 8.1 g/dL — AB (ref 13.0–17.0)
MCH: 26 pg (ref 26.0–34.0)
MCHC: 31.5 g/dL (ref 30.0–36.0)
MCV: 82.4 fL (ref 78.0–100.0)
PLATELETS: 364 10*3/uL (ref 150–400)
RBC: 3.12 MIL/uL — AB (ref 4.22–5.81)
RDW: 21.9 % — ABNORMAL HIGH (ref 11.5–15.5)
WBC: 19.7 10*3/uL — ABNORMAL HIGH (ref 4.0–10.5)

## 2017-05-05 LAB — PROTIME-INR
INR: 1.6
Prothrombin Time: 18.9 seconds — ABNORMAL HIGH (ref 11.4–15.2)

## 2017-05-05 LAB — HEPARIN LEVEL (UNFRACTIONATED): HEPARIN UNFRACTIONATED: 0.19 [IU]/mL — AB (ref 0.30–0.70)

## 2017-05-05 LAB — COOXEMETRY PANEL
Carboxyhemoglobin: 3.1 % — ABNORMAL HIGH (ref 0.5–1.5)
Carboxyhemoglobin: 2.5 % — ABNORMAL HIGH (ref 0.5–1.5)
METHEMOGLOBIN: 0.8 % (ref 0.0–1.5)
METHEMOGLOBIN: 1.2 % (ref 0.0–1.5)
O2 Saturation: 81.6 %
O2 Saturation: 59.8 %
Total hemoglobin: 8.6 g/dL — ABNORMAL LOW (ref 12.0–16.0)
Total hemoglobin: 8.7 g/dL — ABNORMAL LOW (ref 12.0–16.0)

## 2017-05-05 LAB — MAGNESIUM: MAGNESIUM: 2.3 mg/dL (ref 1.7–2.4)

## 2017-05-05 MED ORDER — WARFARIN SODIUM 2 MG PO TABS: 4.0000 mg | ORAL_TABLET | Freq: Once | ORAL | Status: DC

## 2017-05-05 MED ORDER — SODIUM CHLORIDE 0.9 % IV SOLN: 500.0000 mg | INTRAVENOUS | Status: DC

## 2017-05-05 MED ORDER — DOBUTAMINE IN D5W 4-5 MG/ML-% IV SOLN
1.5000 ug/kg/min | INTRAVENOUS | Status: DC
  Administered 2017-05-06: 3 ug/kg/min via INTRAVENOUS
  Filled 2017-05-05: qty 250

## 2017-05-05 MED ORDER — ATORVASTATIN CALCIUM 40 MG PO TABS
40.0000 mg | ORAL_TABLET | Freq: Every day | ORAL | Status: DC
  Administered 2017-05-05 – 2017-05-26 (×22): 40 mg via ORAL
  Filled 2017-05-05 (×22): qty 1

## 2017-05-05 MED ORDER — HEPARIN (PORCINE) IN NACL 100-0.45 UNIT/ML-% IJ SOLN
1500.0000 [IU]/h | INTRAMUSCULAR | Status: DC
  Administered 2017-05-05: 1100 [IU]/h via INTRAVENOUS
  Administered 2017-05-06 (×2): 1500 [IU]/h via INTRAVENOUS
  Filled 2017-05-05 (×3): qty 250

## 2017-05-05 MED ORDER — SODIUM CHLORIDE 0.9 % IV SOLN
500.0000 mg | INTRAVENOUS | Status: AC
  Administered 2017-05-05 – 2017-05-06 (×2): 500 mg via INTRAVENOUS
  Filled 2017-05-05 (×3): qty 0.5

## 2017-05-05 MED ORDER — FUROSEMIDE 10 MG/ML IJ SOLN
120.0000 mg | Freq: Once | INTRAVENOUS | Status: AC
  Administered 2017-05-05: 120 mg via INTRAVENOUS
  Filled 2017-05-05: qty 10

## 2017-05-05 MED ORDER — CITALOPRAM HYDROBROMIDE 20 MG PO TABS
10.0000 mg | ORAL_TABLET | Freq: Every day | ORAL | Status: DC
  Administered 2017-05-05 – 2017-05-19 (×15): 10 mg via ORAL
  Filled 2017-05-05 (×16): qty 1

## 2017-05-05 NOTE — Progress Notes (Signed)
Patient ID: David [name], male   DOB: 1966/01/11, 52 y.o.   MRN: [id number]   Advanced Heart Failure VAD Team Note  Subjective:    David [name] is a 52 y.o. male with a PMH of ICM ('09 PCI to LAD, mRCA, '10 PCI to Lcx and '12 mRCA), HFrEF (EF ~20% for >5 years), HLD and HTN admitted 12/26 for VAD placement due to end-stage systolic HF.   Underwent successful HM-3 LVAD placement 04/20/17.  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   -Extubated 12/28.  -Developed AKI. CVVHD started 12/30 -ReIntubated 04/23/17  -Developed progressive shock with uptitration of norepinephrine and epinephrine and addition of vasopressin.  Milrinone stopped and dobutamine begun.  CXR with CHF.   -Re-extubated 04/26/2017 -Started on Meropenem 04/27/16 with + Achromobacter in tracheal aspirate. - CVVHD stopped 1/10  Yesterday CVVHD stopped. Remains on dobutamine 5 mcg. CO-OX 81%.   A little more SOB this morning. Drinking 1-2 supplements a day. Urine output still < 100cc.24hr  LDH 355  INR 1.6   Echo 12/31 reviewed: No significant pericardial effusion, IV septum midline to slightly to the right, RV function adequate.   LVAD INTERROGATION:  HeartMate III LVAD:  Flow  4.7   liters/min, speed 5300, power 4, PI 3.8    Objective:    Vital Signs:   Temp:  [97.9 F (36.6 C)-99.7 F (37.6 C)] 97.9 F (36.6 C) (01/11 0323) Pulse Rate:  [26-107] 92 (01/11 0700) Resp:  [10-25] 15 (01/11 0700) BP: (89-103)/(62-88) 103/80 (01/11 0600) SpO2:  [55 %-97 %] 94 % (01/11 0700) Weight:  [164 lb 0.4 oz (74.4 kg)] 164 lb 0.4 oz (74.4 kg) (01/11 0500) Last BM Date: 05/04/16 Mean arterial Pressure 70-80s   Intake/Output:   Intake/Output Summary (Last 24 hours) at 05/05/2017 0802 Last data filed at 05/05/2017 0600 Gross per 24 hour  Intake 1091.7 ml  Output 322 ml  Net 769.7 ml     Physical Exam   Physical Exam: CVP 11-12  GENERAL: Sitting in the chair. No acute distress. HEENT: normal  anicteric NECK: Supple, JVP 10-11 .  2+ bilaterally, no bruits.  No lymphadenopathy or thyromegaly appreciated.  RIJ HD LIJ TLC CARDIAC:  Mechanical heart sounds with LVAD hum present.  LUNGS:  Clear decreased in bases  ABDOMEN:  Soft, round, nontender, positive bowel sounds x4.     LVAD exit site: w  Dressing dry and intact.  No erythema or drainage.  Stabilization device present and accurately applied.  Driveline dressing is being changed daily per sterile technique. EXTREMITIES:  Warm and dry, no cyanosis, clubbing, rash or edema  NEUROLOGIC:  Alert and oriented x 4.  Gait steady.  No aphasia.  No dysarthria.  Affect pflat.     Telemetry   NSR 90s +PVCs personally reviewed.   Labs   Basic Metabolic Panel: Recent Labs  Lab 05/01/17 0408  05/02/17 0335  05/03/17 0335 05/03/17 1553 05/04/17 0402 05/04/17 1659 05/05/17 0344  NA 132*  132*   < > 129*   < > 131* 132* 131* 129* 128*  K 4.4  4.4   < > 4.4   < > 4.3 4.1 3.9 4.5 4.6  CL 98*  98*   < > 97*   < > 99* 99* 100* 97* 96*  CO2 27  27   < > 26   < > 27 26 26 26 25   GLUCOSE 104*  102*   < > 94   < > 78 102*  83 92 89  BUN 10  10   < > 10   < > 11 7 7 11 15   CREATININE 1.93*  1.88*   < > 1.93*   < > 1.84* 1.82* 1.77* 2.52* 3.32*  CALCIUM 7.9*  7.9*   < > 7.8*   < > 8.0* 7.8* 7.6* 7.8* 8.3*  MG 2.4  --  2.3  --  2.3  --  2.1  --  2.3  PHOS 2.2*   < > 2.2*   < > 2.1* 3.1 2.5 3.4 4.5   < > = values in this interval not displayed.    Liver Function Tests: Recent Labs  Lab 04/29/17 0353  04/30/17 0525  05/01/17 0408  05/02/17 0335  05/03/17 0335 05/03/17 1553 05/04/17 0402 05/04/17 1659 05/05/17 0344  AST 99*  --  109*  --  96*  --  92*  --   --   --   --   --   --   ALT 92*  --  88*  --  74*  --  66*  --   --   --   --   --   --   ALKPHOS 253*  --  397*  --  423*  --  458*  --   --   --   --   --   --   BILITOT 9.2*  --  8.5*  --  6.1*  --  5.2*  --   --   --   --   --   --   PROT 6.5  --  6.7  --  6.8  --   6.4*  --   --   --   --   --   --   ALBUMIN 1.8*  1.8*   < > 1.7*   < > 1.8*  1.7*   < > 1.6*   < > 1.7* 1.7* 1.6* 1.7* 1.8*   < > = values in this interval not displayed.   No results for input(s): LIPASE, AMYLASE in the last 168 hours. No results for input(s): AMMONIA in the last 168 hours.  CBC: Recent Labs  Lab 04/30/17 0525 05/02/17 0335 05/02/17 1321 05/03/17 0335 05/04/17 0402 05/05/17 0344  WBC 21.8*  --  22.6* 20.0* 19.3* 19.7*  HGB 8.5* 7.6* 9.3* 8.7* 8.4* 8.1*  HCT 26.1* 23.9* 28.7* 27.6* 27.2* 25.7*  MCV 78.1  --  80.6 80.7 82.2 82.4  PLT 265  --  321 336 361 364    INR: Recent Labs  Lab 05/01/17 0408 05/02/17 0335 05/03/17 0335 05/04/17 0402 05/05/17 0344  INR 2.23 1.70 1.65 1.91 1.60    Other results:  Imaging   No results found.   Medications:     Scheduled Medications: . aspirin EC  81 mg Oral Daily  . chlorhexidine  15 mL Mouth Rinse BID  . Chlorhexidine Gluconate Cloth  6 each Topical Daily  . feeding supplement (ENSURE ENLIVE)  237 mL Oral TID BM  . mouth rinse  15 mL Mouth Rinse q12n4p  . pantoprazole  40 mg Oral Daily  . Warfarin - Pharmacist Dosing Inpatient   Does not apply q1800    Infusions: . sodium chloride Stopped (04/30/17 0800)  . sodium chloride    . DOBUTamine 5.028 mcg/kg/min (05/04/17 2300)  . meropenem (MERREM) IV    . dialysis replacement fluid (prismasate) 400 mL/hr at 05/04/17 0212  . dialysis replacement fluid (prismasate) 200 mL/hr at  05/04/17 0221  . dialysate (PRISMASATE) 1,700 mL/hr at 05/04/17 0517  . sodium chloride      PRN Medications: heparin, levalbuterol, morphine injection, ondansetron (ZOFRAN) IV, oxyCODONE, oxymetazoline, simethicone, sodium chloride, sodium chloride flush, sodium chloride flush   Patient Profile   David [name] is a 52 y.o. male with a PMH of ICM ('09 PCI to LAD, mRCA, '10 PCI to Lcx and '12 mRCA), HFrEF (EF ~20% for >5 years) s/p HM-III VAD placement on 12/27 due to  end-stage systolic HF.   Assessment/Plan:    1. Acute on chronic systolic HF: Echo 74/12/87 with LVEF 20-25%, Mild MR, Mild/Mod MR, Severe LAE, Mildly reduced RV, PA peak pressure 56 mm Hg.  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 Extubated, initially stopped epinephrine/norepinephrine but restarted with fall in UOP and MAP.  With progressive renal failure, pulmonary edema, and hypotension, he was re-intubated on 12/30.   CVVH was begun.  12/31 was difficult day with refractory shock (MAP in 60s).  Reviewed 12/31 echo which showed no significant pericardial effusion, adequate RV function, and IV septum near mid-line. Given suspected for vasodilatory/septic shock with PCT 31 04/24/17, he is covered with cefepime.   - Off NE/Epi. Remains on dobutamine 5 mcg.  - CO-OX 82%. Repeat CO-OX now . IF > 55% Will wean to 4 mcg.  - Cotninue aspirin 81 mg daily.  - Warfarin goal INR 2-2.5. Stop IV heparin today. INR up to 1.9. Continue coumadin. Discussed with pharmacy.   2. CAD s/p PCI to mid RCA and PLOM with DES x 2 and DES to mid LAD in 1/18.   - No S/S Ischemia - Continue ASA and statin. No change.   3. PAF: Remains in NSR  Restart heparin today. INR down to 1.6.. Suspect this is related to Ensure supplements.  Discussed with pharmacy.  4. ARF on CKD stage III: Suspect some degree of intrinsic renal disease prior to LVAD placement (baseline creatinine around 1.5) with probable ATN from intra-op/peri-op hypotension and development of vasodilatory/septic shock.   - CVVHD stopped 05/04/2017. Pla to transition to iHD.      - - Appreciate Renal in put.  5. Anemia expected:    s/p 1 u PRBCs 12/30, 04/25/17. 1/8.  -Hgb 8.1 . Watch closely. - CBC in am.   6. ID: Started on cefepime empirically with septic/vasodilatory shock, PCT was 31 04/25/17 Cultures (blood, sputum) NGTD   - Improving. PCT now coming down. WBC stable at 21k - ABX broadened to Meropenem 04/27/16 with +  Achromobacter on tracheal aspirate. Stop after 10 days.  - PCT 8.31 04/27/17 -> 3.87 ->0.94 - WBC 19.7  7. Elevated LFTs due to shock liver Resolved.    INR dropped likely due to Ensure supplements (25% Vitamin K) . Discussed with pharmacy.   Discussed with Dr Cyndia Bent the following. He has had HD catheter for 2 weeks and central line for 7 days. Ideally would like to come off dobutamine. Will likely need ongoing iHD. Timing of HD catheter to be determined. Add 10 mg celexa.    Length of Stay: [place], NP 05/05/2017, 8:02 AM  VAD Team --- VAD ISSUES ONLY--- Pager [phone] (7am - 7am)  Advanced Heart Failure Team  Pager [phone] (M-F; 7a - 4p)  Please contact [facility] for night-coverage after hours (4p -7a ) and weekends on [email or web address]   Patient seen and examined with Darrick Grinder, NP. We discussed all aspects of the encounter.  I agree with the assessment and plan as stated above.   Improving slowly but remains on dobutamine 5. MAP and co-ox ok (repeating co-ox). Will wean dobutamine as tolerated. Can use midodrine to support BP. Will do ramp echo today to optimize VAD speed.  Switching to iHD per Renal. Remains anuric.   Taking Ensure supplements so INR back down. Restart heparin. Discussed with PharmD.  Stopping meropenem tomorrow. Will need to remove some lines. Can remove TLC. Will ask renal about switching Trialysis cath to Perm-cath. Will place PICC for now.   Can go to [facility].   Glori Bickers, MD  9:00 AM

## 2017-05-05 NOTE — Progress Notes (Addendum)
ANTICOAGULATION CONSULT NOTE - Follow Up Consult  Pharmacy Consult for Heparin Indication: LVAD  Allergies  Allergen Reactions  . Plavix [Clopidogrel Bisulfate] Hives    Patient Measurements: Height: 5\' 5"  (165.1 cm) Weight: 164 lb 0.4 oz (74.4 kg) IBW/kg (Calculated) : 61.5   Vital Signs: Temp: 98.9 F (37.2 C) (01/11 1605) Temp Source: Axillary (01/11 1605) BP: 86/71 (01/11 1800) Pulse Rate: 57 (01/11 1900)  Labs: Recent Labs    05/03/17 0335  05/03/17 2139 05/04/17 0402 05/04/17 1659 05/05/17 0344 05/05/17 1653  HGB 8.7*  --   --  8.4*  --  8.1*  --   HCT 27.6*  --   --  27.2*  --  25.7*  --   PLT 336  --   --  361  --  364  --   LABPROT 19.4*  --   --  21.8*  --  18.9*  --   INR 1.65  --   --  1.91  --  1.60  --   HEPARINUNFRC  --   --  0.54 0.74*  --   --  <0.10*  CREATININE 1.84*   < >  --  1.77* 2.52* 3.32* 4.09*   < > = values in this interval not displayed.    Estimated Creatinine Clearance: 19.7 mL/min (A) (by C-G formula based on SCr of 4.09 mg/dL (H)).   Assessment: 52 yo male s/p LVAD implantation 12/27. Pharmacy consulted for heparin drip; warfarin on hold pending HD tunneled cath placement.  Heparin level is subtherapeutic and now undetectable, previously 0.74 on 1200 units/hr. Spoke with RN and there have been no infusion problems; lab was drawn from central line after heparin was stopped and line flushed so unsure how accurate level is.  Goal of Therapy:  Heparin level 0.3-0.7 units/ml Monitor platelets by anticoagulation protocol: Yes   Plan:  Repeat STAT heparin level to be drawn as a peripheral draw   Renold Genta, PharmD, BCPS Clinical Pharmacist Phone for today - [place] - [phone] 05/05/2017 7:19 PM   Addendum: Repeat heparin level low at 0.19 on 1100 units/hr. No bleeding noted.  Increase heparin drip to 1350 units/hr 8 hr heparin level  Renold Genta, PharmD, BCPS Clinical Pharmacist 05/05/2017 8:30  PM

## 2017-05-05 NOTE — Progress Notes (Signed)
  Echocardiogram 2D Echocardiogram has been performed.  David [name] 05/05/2017, 1:35 PM

## 2017-05-05 NOTE — Progress Notes (Signed)
Physical Therapy Treatment Patient Details Name: David [name] MRN: [id number] DOB: [birth year] Today's Date: 05/05/2017    History of Present Illness This 52 y.o. male admitted for LVAD. Pt extubated 12/28.   on 12/30, pt developed respiratory distress with pulmonary edema, and increased creatanine.  He was intubated 12/30 > 04/26/16, and CRRT initiated 12/30 and discontinued 1/10.    PMH includes:  Acute on chronic systolic HF, CAD, PAF, CKD stage III, LBBB, frequent PVCs, seizures, PNS, MI, ischemic cardiomyopathy, s/p AICD     PT Comments    Pt with greatly improved mobility and activity tolerance. Pt able to amb 2/3 of the unit (260') with very little assistance. Much improved gait posture with patient using waist belt for controller after reporting shoulder/neck harness was heavy on his neck. If pt continues to demonstrate progress from this level of mobility feel he can return home from wife. However pt will be initiating intermittent HD soon and we will have to see how this affects him and may have to adjust dc recommendations again. Pt didn't feel he could perform battery to power switch over with OT instruction today.  Follow Up Recommendations  Supervision/Assistance - 24 hour;Home health PT     Equipment Recommendations  Other (comment)(rollator)    Recommendations for Other Services       Precautions / Restrictions Precautions Precautions: Fall;Sternal;Other (comment)(LVAD) Precaution Comments: requires min cues for sternal precautions; pt on CRRT Restrictions Other Position/Activity Restrictions: sternal precautions    Mobility  Bed Mobility Overal bed mobility: Needs Assistance Bed Mobility: Sit to Supine       Sit to supine: Min guard   General bed mobility comments: Assist for managing lines/tubes and safety  Transfers Overall transfer level: Needs assistance Equipment used: 4-wheeled walker Transfers: Sit to/from Stand Sit to Stand: Min guard          General transfer comment: Assist for safety and line management  Ambulation/Gait Ambulation/Gait assistance: Min guard Ambulation Distance (Feet): 260 Feet Assistive device: 4-wheeled walker Gait Pattern/deviations: Step-through pattern;Decreased stride length Gait velocity: decr Gait velocity interpretation: Below normal speed for age/gender General Gait Details: Assist for safety and line management. Pt amb on 3L of O2 with dyspnea 2/4 and 1 brief standing rest break.   Stairs            Wheelchair Mobility    Modified Rankin (Stroke Patients Only)       Balance Overall balance assessment: Needs assistance Sitting-balance support: Feet supported Sitting balance-Leahy Scale: Fair     Standing balance support: No upper extremity supported Standing balance-Leahy Scale: Fair Standing balance comment: supervision for static standing                            Cognition Arousal/Alertness: Awake/alert Behavior During Therapy: WFL for tasks assessed/performed Overall Cognitive Status: Impaired/Different from baseline Area of Impairment: Attention;Memory                   Current Attention Level: Selective Memory: Decreased short-term memory;Decreased recall of precautions         General Comments: Pt self reports that his memory isn't as good as it normally is.      Exercises      General Comments General comments (skin integrity, edema, etc.): Vital signs stable. Unable to consitently obtain accurate SpO2.       Pertinent Vitals/Pain Pain Assessment: Faces Faces Pain Scale: Hurts even more Pain Location: sternal Pain  Descriptors / Indicators: Grimacing Pain Intervention(s): Limited activity within patient's tolerance;Monitored during session;Premedicated before session    Home Living                      Prior Function            PT Goals (current goals can now be found in the care plan section) Progress towards PT  goals: Progressing toward goals    Frequency    Min 3X/week      PT Plan Discharge plan needs to be updated    Co-evaluation PT/OT/SLP Co-Evaluation/Treatment: Yes Reason for Co-Treatment: Complexity of the patient's impairments (multi-system involvement) PT goals addressed during session: Mobility/safety with mobility        AM-PAC PT "6 Clicks" Daily Activity  Outcome Measure  Difficulty turning over in bed (including adjusting bedclothes, sheets and blankets)?: A Little Difficulty moving from lying on back to sitting on the side of the bed? : Unable Difficulty sitting down on and standing up from a chair with arms (e.g., wheelchair, bedside commode, etc,.)?: Unable Help needed moving to and from a bed to chair (including a wheelchair)?: A Little Help needed walking in hospital room?: A Little Help needed climbing 3-5 steps with a railing? : A Lot 6 Click Score: 13    End of Session Equipment Utilized During Treatment: Oxygen;Other (comment)(LVAD back up bag) Activity Tolerance: Patient tolerated treatment well Patient left: with call bell/phone within reach;in bed Nurse Communication: Mobility status PT Visit Diagnosis: Unsteadiness on feet (R26.81);Muscle weakness (generalized) (M62.81);Difficulty in walking, not elsewhere classified (R26.2)     Time: 7482-7078 PT Time Calculation (min) (ACUTE ONLY): 37 min  Charges:  $Gait Training: 8-22 mins                    G Codes:       [facility] PT [place] 05/05/2017, 11:06 AM

## 2017-05-05 NOTE — Progress Notes (Signed)
  Exit site care:   Patien pre-medicated with IV morphine prior to dressing change due to patient's intolerance of dressing removal due to pain.   VAD Coordinator observed staff nurse Rachel Moulds perform drive line exit wound care. Existing VAD dressing with bloody drainage noted; has been reinforced.  Dressing removed and site care performed using sterile technique. Drive line exit site cleaned with Chlora prep applicators x 2, allowed to dry, and aqaucell silver strips with daily dressing kit re-applied. Additional strips and gauze placed around/over exit site for bloody drainage. Exit site is red with large amount bloody drainage and clotting noted under and around drive line. The velour is not yet incorporated. No tenderness, foul odor or rash noted. Drive line anchor with bloody drainage; removed and replaced.      VAD teaching: Pt refused to switch power sources from batteries to PM with Physical Therapy earlier today, stating he "wasn't ready".    Wife at bedside after dressing change.  VAD Coordinator demonstrated controller soft keys function, perc lock, perc lead and power cable connections, system controller self test, and changing power sources from batteries to patient cable.  Wife performed return demonstration on changing power source from batteries to patient cable multiple times without problem. Pt remains drowsy from morphine, but states he wants "wife to learn everything".  Reviewed with wife how to chart daily temp, wt, and VAD parameters in daily logbook and ask that she or patient perform this daily while in hospital.  Also reinforced need for patient and wife to complete reading the [facility] Patient Handbook; wife states she has "started" but will continue. Asked her to read aloud with patient as he needs to learn as well.   Zada Girt RN, VAD Coordinator 24/7 VAD pager:  [phone]

## 2017-05-05 NOTE — Consult Note (Signed)
Chief Complaint: Patient was seen in consultation today for tunneled dialysis catheter at the request of Dr Willy Eddy  Referring Physician(s): Dr Justin Mend  Supervising Physician: Marybelle Killings  Patient Status: [name] - In-pt  History of Present Illness: David [name] is a 52 y.o. male   Systolic Heart failure and ischemic cardiomyopathy HM3 LVAD 04/20/17 CRRT - tolerating well Has existing Triple Lumen left IJ Plan is to transition to intermittent dialysis Request for tunneled catheter placement  Anuric On coumadin - LVAD INR 1.6 today Heparin with CRRT and continual Afeb; WBC 19.7  Discussed with Dr Barbie Banner; Dr Justin Mend and Amy Klegg NP Plan will be to come off coumadin now Keep NPO after MN Sunday for Monday procedure in IR Continue Heparin till discontined 4 hrs before procedure   Past Medical History:  Diagnosis Date  . AICD (automatic cardioverter/defibrillator) present   . Anemia   . Anxiety   . CAD (coronary artery disease) 2009   AMI in 12/2007 with PCI to LAD, staged PCI to  mid/distal RCA, NSTEMI in 02/2009 with BMS to LCx  . CHF (congestive heart failure) ([place])   . Chronic kidney disease 11/03/2016   stage 3 kidney disease  . Dysrhythmia    "arrythmia problems at some point", "fatal rhythms"  . History of blood transfusion    "I was bleeding from was rectum" (04/19/2017)  . HLD (hyperlipidemia)   . HTN (hypertension)   . Ischemic cardiomyopathy    Admitted in 07/2010 with CHF exacerbation  . Myocardial infarction ([place])    "I've had 4" (04/19/2017)  . Pneumonia 2018 X 1  . Seizures ([place])    one seizure in 04/2016 during cardiac event (04/19/2017)    Past Surgical History:  Procedure Laterality Date  . AORTIC VALVE REPAIR N/A 04/20/2017   Procedure: AORTIC VALVE REPAIR;  Surgeon: Gaye Pollack, MD;  Location: [place];  Service: Open Heart Surgery;  Laterality: N/A;  . BIV ICD INSERTION CRT-D N/A 08/01/2016   Procedure: BiV ICD Insertion CRT-D;  Surgeon: Will  Meredith Leeds, MD;  Location: [place] [facility];  Service: Cardiovascular;  Laterality: N/A;  . CARDIAC CATHETERIZATION N/A 05/05/2016   Procedure: Right/Left Heart Cath and Coronary Angiography;  Surgeon: Jolaine Artist, MD;  Location: [place] [facility];  Service: Cardiovascular;  Laterality: N/A;  . CARDIAC CATHETERIZATION N/A 05/09/2016   Procedure: Coronary Stent Intervention;  Surgeon: Peter M Martinique, MD;  Location: [place] [facility];  Service: Cardiovascular;  Laterality: N/A;  . CARDIAC CATHETERIZATION N/A 05/09/2016   Procedure: Intravascular Pressure Wire/FFR Study;  Surgeon: Peter M Martinique, MD;  Location: [place] [facility];  Service: Cardiovascular;  Laterality: N/A;  . CORONARY ANGIOPLASTY WITH STENT PLACEMENT     "i've got a total of 12 stents" (04/19/2017)  . ELECTROPHYSIOLOGY STUDY N/A 06/24/2016   Procedure: Electrophysiology Study;  Surgeon: Deboraha Sprang, MD;  Location: [place] [facility];  Service: Cardiovascular;  Laterality: N/A;  . EPICARDIAL PACING LEAD PLACEMENT Left 11/07/2016   Procedure: LV EPICARDIAL PACING LEAD PLACEMENT VIA LEFT MINI THORACOTOMY;  Surgeon: Gaye Pollack, MD;  Location: [place];  Service: Thoracic;  Laterality: Left;  . ICD IMPLANT N/A 06/24/2016   Procedure: POSSIBLE  ICD Implant;  Surgeon: Deboraha Sprang, MD;  Location: [place] [facility];  Service: Cardiovascular;  Laterality: N/A;  . INSERTION OF IMPLANTABLE LEFT VENTRICULAR ASSIST DEVICE N/A 04/20/2017   Procedure: INSERTION OF IMPLANTABLE LEFT VENTRICULAR ASSIST DEVICE, Aortic Valve repair;  Surgeon: Gaye Pollack, MD;  Location: [place];  Service: Open Heart Surgery;  Laterality: N/A;  Heartmate 3  . IR FLUORO GUIDE CV MIDLINE PICC RIGHT  03/14/2017  . IR US GUIDE VASC ACCESS RIGHT  03/14/2017  . LEFT HEART CATHETERIZATION WITH CORONARY ANGIOGRAM N/A 03/13/2011   Procedure: LEFT HEART CATHETERIZATION WITH CORONARY ANGIOGRAM;  Surgeon: Lorretta Harp, MD;  Location: [facility] CATH LAB;  Service:  Cardiovascular;  Laterality: N/A;  . MULTIPLE EXTRACTIONS WITH ALVEOLOPLASTY N/A 04/12/2017   Procedure: Extraction of tooth #'s 2, 5-12, 17, 20-29 with alveoloplasty amd maxillary right buccal exostoses reductions.;  Surgeon: Lenn Cal, DDS;  Location: [place];  Service: Oral Surgery;  Laterality: N/A;  . RIGHT HEART CATH N/A 04/19/2017   Procedure: RIGHT HEART CATH;  Surgeon: Jolaine Artist, MD;  Location: [place] [facility];  Service: Cardiovascular;  Laterality: N/A;  . RIGHT/LEFT HEART CATH AND CORONARY ANGIOGRAPHY N/A 03/10/2017   Procedure: RIGHT/LEFT HEART CATH AND CORONARY ANGIOGRAPHY;  Surgeon: Jolaine Artist, MD;  Location: [place] [facility];  Service: Cardiovascular;  Laterality: N/A;  . TEE WITHOUT CARDIOVERSION N/A 04/20/2017   Procedure: TRANSESOPHAGEAL ECHOCARDIOGRAM (TEE);  Surgeon: Gaye Pollack, MD;  Location: [place];  Service: Open Heart Surgery;  Laterality: N/A;    Allergies: Plavix [clopidogrel bisulfate]  Medications: Prior to Admission medications   Medication Sig Start Date End Date Taking? Authorizing Provider  amiodarone (PACERONE) 200 MG tablet Take 200 mg by mouth 2 (two) times daily. 09/23/16  Yes [provider]  aspirin 81 MG chewable tablet Chew 1 tablet (81 mg total) by mouth daily. 05/13/16  Yes Shirley Friar, PA-C  atorvastatin (LIPITOR) 40 MG tablet Take 1 tablet (40 mg total) by mouth daily at 6 PM. 03/14/17  Yes Tillery, Satira Mccallum, PA-C  DOBUTamine (DOBUTREX) 4-5 MG/ML-% infusion Inject 352.5 mcg/min into the vein continuous. 03/14/17  Yes Shirley Friar, PA-C  furosemide (LASIX) 80 MG tablet Take 80 mg by mouth daily.   Yes [provider]  ivabradine (CORLANOR) 5 MG TABS tablet Take 0.5 tablets (2.5 mg total) by mouth 2 (two) times daily with a meal. 08/02/16  Yes Dessa Phi, DO  losartan (COZAAR) 25 MG tablet Take 0.5 tablets (12.5 mg total) by mouth daily. 03/15/17  Yes Shirley Friar, PA-C  Multiple Vitamins-Minerals (ADULT GUMMY) CHEW Chew 2 tablets by mouth daily.   Yes [provider]  oxyCODONE-acetaminophen (PERCOCET) 5-325 MG tablet Take one or two tablets by mouth every 6 hours as needed for pain. 04/12/17  Yes Lenn Cal, DDS  acetaminophen (TYLENOL) 325 MG tablet Take 2 tablets (650 mg total) by mouth every 4 (four) hours as needed for headache or mild pain. 04/13/17   Shirley Friar, PA-C  linaclotide (LINZESS) 290 MCG CAPS capsule Take 290 mcg by mouth daily as needed (constipation).    [provider]     Family History  Problem Relation Age of Onset  . Coronary artery disease Father   . Hypertension Father   . Hypertension Sister   . Coronary artery disease Brother   . Emphysema Brother   . Coronary artery disease Mother   . Hypertension Mother   . Emphysema Mother   . Hypertension Daughter   . Obesity Daughter   . Diabetes Unknown   . Hypertension Unknown   . Coronary artery disease Unknown     Social History   Socioeconomic History  . Marital status: Married  Spouse name: [name]  . Number of children: 3  . Years of education: None  . Highest education level: None  Social Needs  . Financial resource strain: None  . Food insecurity - worry: None  . Food insecurity - inability: None  . Transportation needs - medical: None  . Transportation needs - non-medical: None  Occupational History  . None  Tobacco Use  . Smoking status: Former Smoker    Packs/day: 0.50    Years: 43.00    Pack years: 21.50    Types: Cigarettes, Cigars    Last attempt to quit: 04/29/2016    Years since quitting: 1.0  . Smokeless tobacco: Never Used  Substance and Sexual Activity  . Alcohol use: No  . Drug use: No  . Sexual activity: Yes  Other Topics Concern  . None  Social History Narrative  . None    Review of Systems: A 12 point ROS discussed and pertinent positives are indicated in the HPI above.  All other  systems are negative.  Review of Systems  Constitutional: Positive for activity change, appetite change and fatigue. Negative for fever.  Eyes: Negative for visual disturbance.  Respiratory: Positive for shortness of breath. Negative for cough.   Cardiovascular: Negative for chest pain.  Gastrointestinal: Negative for abdominal pain.  Musculoskeletal: Positive for gait problem.  Neurological: Positive for weakness.  Psychiatric/Behavioral: Negative for behavioral problems and confusion.    Vital Signs: BP 95/77   Pulse 89   Temp 98.5 F (36.9 C) (Oral)   Resp 19   Ht 5\' 5"  (1.651 m)   Wt 164 lb 0.4 oz (74.4 kg)   SpO2 98%   BMI 27.29 kg/m   Physical Exam  Constitutional: He is oriented to person, place, and time.  Cardiovascular: Regular rhythm.  Pulmonary/Chest: Effort normal. He has wheezes.  Abdominal: Soft. Bowel sounds are normal.  Musculoskeletal: Normal range of motion.  Neurological: He is alert and oriented to person, place, and time.  Skin: Skin is warm and dry.  Psychiatric: He has a normal mood and affect. His behavior is normal. Thought content normal.  Wife at bedside---signed consent  Nursing note and vitals reviewed.   Imaging: Dg Chest 2 View  Result Date: 04/19/2017 CLINICAL DATA:  Heart failure with preserved ejection fracture. History of CHF, hypertension, coronary artery disease, MI, seizures. Ex-smoker. EXAM: CHEST  2 VIEW COMPARISON:  Chest x-ray dated 03/10/2017. FINDINGS: Cardiomegaly is stable. Left chest wall pacemaker/ICD apparatus appears stable in configuration. Lungs are clear. No large pleural effusion or pneumothorax seen. IMPRESSION: No active cardiopulmonary disease. No evidence of pneumonia or pulmonary edema. Stable cardiomegaly Electronically Signed   By: Franki Cabot M.D.   On: 04/19/2017 20:39   Dg Chest Port 1 View  Result Date: 05/05/2017 CLINICAL DATA:  Recent LVAD placement. EXAM: PORTABLE CHEST 1 VIEW COMPARISON:  04/28/2017;  04/27/2017 FINDINGS: Grossly unchanged enlarged cardiac silhouette and mediastinal contours. Post coronary stent placement. Stable position of support apparatus. No pneumothorax. Pulmonary vasculature remains indistinct with cephalization of flow. Minimal bibasilar heterogeneous opacities, left greater than right, unchanged in favored to represent atelectasis. Suspected trace bilateral effusions. No acute osseus abnormalities. IMPRESSION: 1.  Stable positioning of support apparatus.  No pneumothorax. 2. Similar findings of cardiomegaly, mild pulmonary edema, trace pleural effusions and associated bibasilar opacities, likely atelectasis. Electronically Signed   By: Sandi Mariscal M.D.   On: 05/05/2017 09:17   Dg Chest Port 1 View  Result Date: 04/28/2017 CLINICAL DATA:  Left  ventricular assistance device. EXAM: PORTABLE CHEST 1 VIEW COMPARISON:  Radiograph of April 27, 2017. FINDINGS: Stable cardiomegaly. Left ventricular assistance device is unchanged in position. Left-sided pacemaker is unchanged in position. Right subclavian catheter is unchanged. Left internal jugular catheter is unchanged. No pneumothorax is noted. Stable bibasilar atelectasis or edema is noted. Bony thorax is unremarkable. IMPRESSION: Stable position of left ventricular assistance device. Stable support apparatus. Stable bibasilar atelectasis or edema. Electronically Signed   By: Marijo Conception, M.D.   On: 04/28/2017 07:47   Dg Chest Port 1 View  Result Date: 04/27/2017 CLINICAL DATA:  Central line placement. EXAM: PORTABLE CHEST 1 VIEW COMPARISON:  April 27, 2017 FINDINGS: A new left central line has been placed. The distal tip appears to be in the central SVC. The right IJ sheath and right subclavian line are stable. No pneumothorax. The LVAD device is stable as is the pacemaker. Interstitial opacities in the lungs, probably edema, are unchanged. No other interval changes. Stable cardiomegaly. IMPRESSION: The new left central line is in  good position with no pneumothorax. Other support apparatus is stable. Persistent edema and cardiomegaly. Electronically Signed   By: Dorise Bullion III M.D   On: 04/27/2017 13:28   Dg Chest Port 1 View  Result Date: 04/27/2017 CLINICAL DATA:  Left ventricular assist device. EXAM: PORTABLE CHEST 1 VIEW COMPARISON:  04/26/2017, 04/24/2017, 03/10/2017, 08/02/2016. FINDINGS: Interim extubation removal of NG tube. Right IJ line and right IJ sheath in stable position. Cardiac pacer and left ventricular assist device in stable position. Prior CABG. Stable cardiomegaly. Persistent mild bilateral interstitial prominence bilateral pleural effusions suggesting CHF. Slight worsening from prior exam. No pneumothorax. IMPRESSION: 1. Interim removal of endotracheal tube and NG tube. Remaining lines and tubes stable position. Cardiac pacer left ventricular assist device in stable position. 2. Prior CABG. Cardiomegaly with persistent mild bilateral interstitial prominence and small pleural effusions suggesting mild CHF. Slight worsening from prior exam. Electronically Signed   By: Marcello Moores  Register   On: 04/27/2017 07:25   Dg Chest Port 1 View  Result Date: 04/26/2017 CLINICAL DATA:  Check endotracheal tube placement EXAM: PORTABLE CHEST 1 VIEW COMPARISON:  04/25/2017 FINDINGS: Cardiac shadow is enlarged. LVAD is again identified. Defibrillator is again seen and stable. Endotracheal tube, nasogastric catheter, right jugular sheath and right subclavian central line are again seen and stable. The Swan-Ganz catheter has been removed. The lungs are well aerated bilaterally. Mild interstitial edema is again seen and stable. No new focal abnormality is noted. IMPRESSION: No significant change from the prior exam aside from removal of the Swan-Ganz catheter Electronically Signed   By: Inez Catalina M.D.   On: 04/26/2017 07:28   Dg Chest Port 1 View  Result Date: 04/25/2017 CLINICAL DATA:  Left ventricular assist device EXAM:  PORTABLE CHEST 1 VIEW COMPARISON:  04/24/2017 FINDINGS: Endotracheal tube in good position. Swan-Ganz catheter remains kinked in the main pulmonary artery. Right subclavian central venous catheter tip in the lower SVC unchanged. NG in the stomach. Left chest tube remains in place.  No pneumothorax. Left ventricular assist device unchanged.  AICD unchanged. Negative for pneumothorax. Bilateral airspace disease consistent with edema shows mild improvement. Left lower lobe atelectasis unchanged IMPRESSION: Support lines remain in good position.  No pneumothorax Mild improvement pulmonary edema. Electronically Signed   By: Franchot Gallo M.D.   On: 04/25/2017 08:04   Dg Chest Port 1 View  Result Date: 04/24/2017 CLINICAL DATA:  Status post aortic valve repair and insertion of  a left ventricular assist device 4 days ago. EXAM: PORTABLE CHEST 1 VIEW COMPARISON:  Portable chest x-ray of April 23, 2017 FINDINGS: The lungs are adequately inflated. The interstitial markings are increased but fairly stable. There is no alveolar infiltrate. There is no pleural effusion or pneumothorax. The cardiac silhouette remains enlarged. The pulmonary vascularity is engorged but slightly more distinct today. The endotracheal tube tip projects 3.5 cm above the carina. The esophagogastric tube tip and proximal port project in the gastric cardia. The right subclavian venous catheter tip projects over the junction of the middle and distal thirds of the SVC. The Swan-Ganz catheter tip projects over proximal left pulmonary artery branch. The left ventricular assist device is in stable position. The ICD is in stable position. The 2 left-sided chest tubes are in stable position. The sternal wires are intact. IMPRESSION: CHF with moderate pulmonary interstitial edema perhaps slightly improved today. The support devices are in stable position. Electronically Signed   By: David  Martinique M.D.   On: 04/24/2017 07:37   Dg Chest Port 1  View  Result Date: 04/23/2017 CLINICAL DATA:  Encounter for intubation. Acute respiratory failure . Ischemic cardiomyopathy. Postop from aortic valve repair and left ventricular assist device. EXAM: PORTABLE CHEST 1 VIEW COMPARISON:  Prior today FINDINGS: New endotracheal tube and nasogastric tube are seen in appropriate position. Other support lines and tubes are unchanged in position. Swan-Ganz catheter is again seen with tip in the proximal right pulmonary artery. Right chest tube remains in place and no pneumothorax is visualized. Stable marked cardiac enlargement. Stable diffuse bilateral pulmonary airspace disease, most likely due to pulmonary edema. IMPRESSION: New endotracheal tube and nasogastric tube in appropriate position. Stable cardiomegaly and diffuse bilateral airspace disease. Electronically Signed   By: Earle Gell M.D.   On: 04/23/2017 17:47   Dg Chest Port 1 View  Result Date: 04/23/2017 CLINICAL DATA:  Encounter for central line placement EXAM: PORTABLE CHEST 1 VIEW COMPARISON:  04/23/2017 FINDINGS: Right subclavian central venous catheter tip is at the cavoatrial junction. Swan-Ganz catheter is identified with tip looped in the expected location of the main pulmonary artery. There is a left chest wall ICD with lead in the right atrial appendage and right ventricle. LVAD device appears stable in position from previous exam. Previous median sternotomy and CABG procedure. Stable cardiac enlargement and pulmonary edema. IMPRESSION: 1. Postsurgical changes are stable compared with previous exam. 2. No change in pulmonary edema pattern. Electronically Signed   By: Kerby Moors M.D.   On: 04/23/2017 10:19   Dg Chest Port 1 View  Result Date: 04/23/2017 CLINICAL DATA:  Follow-up LVAD EXAM: PORTABLE CHEST 1 VIEW COMPARISON:  04/22/2017 FINDINGS: Cardiac shadow remains enlarged. Swan-Ganz catheter is noted coiled within the pulmonary outflow tract. Left ventricular assist device is again  seen and stable. Defibrillator is again noted as well. Left-sided chest tube and pericardial drain are seen. The mediastinal drain has been removed in the interval. The lungs are well aerated bilaterally but again demonstrate patchy infiltrates particularly in the basis. No definitive pneumothorax is seen. IMPRESSION: Postsurgical changes as described Patchy infiltrative changes bilaterally. Electronically Signed   By: Inez Catalina M.D.   On: 04/23/2017 09:28   Dg Chest Port 1 View  Result Date: 04/22/2017 CLINICAL DATA:  Pneumonia EXAM: PORTABLE CHEST 1 VIEW COMPARISON:  April 21, 2017 FINDINGS: The heart size and mediastinal contours are stable. Swan-Ganz catheter is unchanged. Left chest tube and mediastinal drain are unchanged. Cardiac pacemaker is stable.  Previously noted endotracheal tube is removed. The heart size is enlarged. There is pulmonary edema. Patchy consolidation of left lung base is unchanged. The visualized skeletal structures are stable. IMPRESSION: Pulmonary edema.  Patchy consolidation of left lung base unchanged. Electronically Signed   By: Abelardo Diesel M.D.   On: 04/22/2017 10:28   Dg Chest Port 1 View  Result Date: 04/21/2017 CLINICAL DATA:  Left ventricular assist device, endotracheal tube. EXAM: PORTABLE CHEST 1 VIEW COMPARISON:  04/20/2017 and CT chest 03/13/2017. FINDINGS: Endotracheal tube terminates 4.5 cm above the carina. Right IJ Swan-Ganz catheter is coiled in the left pulmonary artery, as before. Pacemaker and ICD lead tips project over the right atrium and right ventricle. Left ventricular assist device is in place. Left chest tubes are stable in position. Trachea is midline. Heart is enlarged. Mild basilar predominant mixed interstitial and airspace opacification, similar to minimally improved from 04/20/2017. Left costophrenic angle is obscured by the LVAD. No definite right pleural fluid. IMPRESSION: 1. Stable support tubes/lines with the left IJ Swan-Ganz  catheter tip in the left pulmonary artery. 2. Pulmonary edema, similar to minimally improved. 3. Mild bibasilar atelectasis. Electronically Signed   By: Lorin Picket M.D.   On: 04/21/2017 07:27   Dg Chest Port 1 View  Result Date: 04/20/2017 CLINICAL DATA:  Post LVAD insertion EXAM: PORTABLE CHEST 1 VIEW COMPARISON:  04/19/2017, 03/13/2017 FINDINGS: Interval intubation, tip of the endotracheal tube is 2.6 cm superior to the carina. Interval sternotomy. Left-sided pacing generator with leads projecting over right atrium and right ventricle. Disconnected epicardial leads on the left side. Probable coronary stent. Right IJ Swan-Ganz catheter with distal portion of the catheter folded back upon itself and the tip directed cephalad over expected region of left pulmonary artery. Placement of chest drainage catheters over the left chest and mediastinum. No definite pneumothorax. Placement of LVAD. Cardiomegaly with mild pulmonary edema. Small pleural effusions and bibasilar atelectasis or consolidation. IMPRESSION: 1. Placement of support lines and tubes as above. Swan-Ganz catheter appears slightly kinked/folded back upon itself distally with the tip directed cephalad and slightly to the patient's left 2. Interval sternotomy changes with placement of LVAD device 3. Cardiomegaly with mild pulmonary edema and development of small pleural effusions and bibasilar consolidations. Electronically Signed   By: Donavan Foil M.D.   On: 04/20/2017 14:57    Labs:  CBC: Recent Labs    05/02/17 1321 05/03/17 0335 05/04/17 0402 05/05/17 0344  WBC 22.6* 20.0* 19.3* 19.7*  HGB 9.3* 8.7* 8.4* 8.1*  HCT 28.7* 27.6* 27.2* 25.7*  PLT 321 336 361 364    COAGS: Recent Labs    03/10/17 2233 03/11/17 0933 03/13/17 1130  04/20/17 1416  05/02/17 0335 05/03/17 0335 05/04/17 0402 05/05/17 0344  INR  --   --  1.11   < > 1.42  1.44   < > 1.70 1.65 1.91 1.60  APTT 55* 70* 80*  --  41*  39*  --   --   --   --   --     < > = values in this interval not displayed.    BMP: Recent Labs    05/03/17 1553 05/04/17 0402 05/04/17 1659 05/05/17 0344  NA 132* 131* 129* 128*  K 4.1 3.9 4.5 4.6  CL 99* 100* 97* 96*  CO2 26 26 26 25   GLUCOSE 102* 83 92 89  BUN 7 7 11 15   CALCIUM 7.8* 7.6* 7.8* 8.3*  CREATININE 1.82* 1.77* 2.52* 3.32*  GFRNONAA 41*  42* 28* 20*  GFRAA 47* 49* 32* 23*    LIVER FUNCTION TESTS: Recent Labs    04/29/17 0353  04/30/17 0525  05/01/17 0408  05/02/17 0335  05/03/17 1553 05/04/17 0402 05/04/17 1659 05/05/17 0344  BILITOT 9.2*  --  8.5*  --  6.1*  --  5.2*  --   --   --   --   --   AST 99*  --  109*  --  96*  --  92*  --   --   --   --   --   ALT 92*  --  88*  --  74*  --  66*  --   --   --   --   --   ALKPHOS 253*  --  397*  --  423*  --  458*  --   --   --   --   --   PROT 6.5  --  6.7  --  6.8  --  6.4*  --   --   --   --   --   ALBUMIN 1.8*  1.8*   < > 1.7*   < > 1.8*  1.7*   < > 1.6*   < > 1.7* 1.6* 1.7* 1.8*   < > = values in this interval not displayed.    TUMOR MARKERS: No results for input(s): AFPTM, CEA, CA199, CHROMGRNA in the last 8760 hours.  Assessment and Plan:  Heart failure Valve replacement- LVAD now 12/21/7/18 CKD 3 Worsening Cr level CRRT---to transition to HD Scheduled for tunneled catheter placement 1/14 in IR Will stop coumadin now- Okayed with A Klegg NP Continue Hep  (will hold 4 hrs before procedure 1/14) Will recheck wbc 1/14 am Risks and benefits discussed with the patient including, but not limited to bleeding, infection, vascular injury, pneumothorax which may require chest tube placement, air embolism or even death All of the patient's questions were answered, patient is agreeable to proceed. Consent signed and in chart.  Thank you for this interesting consult.  I greatly enjoyed meeting Auguste Tebbetts and look forward to participating in their care.  A copy of this report was sent to the requesting provider on this  date.  Electronically Signed: Lavonia Drafts, PA-C 05/05/2017, 2:07 PM   I spent a total of 40 Minutes    in face to face in clinical consultation, greater than 50% of which was counseling/coordinating care for tunneled hemodia;ysis catheter placement

## 2017-05-05 NOTE — Progress Notes (Signed)
Bladder Scan preformed and results were 179ml. Scanned four times and all under 11ml.

## 2017-05-05 NOTE — Progress Notes (Signed)
Occupational Therapy Treatment Patient Details Name: David [name] MRN: [id number] DOB: Oct 14, 1965 Today's Date: 05/05/2017    History of present illness This 52 y.o. male admitted for LVAD. Pt extubated 12/28.   on 12/30, pt developed respiratory distress with pulmonary edema, and increased creatanine.  He was intubated 12/30 > 04/26/16, and CRRT initiated 12/30 and discontinued 1/10.    PMH includes:  Acute on chronic systolic HF, CAD, PAF, CKD stage III, LBBB, frequent PVCs, seizures, PNS, MI, ischemic cardiomyopathy, s/p AICD    OT comments  Pt demonstrates significantly improved activity tolerance.  Pt seen with PT.  He ambulated ~260' with min guard assist.  Began instruction with switching from power <> battery, but pt deferred actual practice as he states he is not ready to perform on his own due to memory difficulties.   Will continue to follow.   Follow Up Recommendations  Home health OT;Supervision/Assistance - 24 hour    Equipment Recommendations  Tub/shower seat    Recommendations for Other Services      Precautions / Restrictions Precautions Precautions: Fall;Sternal;Other (comment)(LVAD) Precaution Comments: requires min cues for sternal precautions; pt on CRRT Restrictions Other Position/Activity Restrictions: sternal precautions       Mobility Bed Mobility Overal bed mobility: Needs Assistance Bed Mobility: Sit to Supine       Sit to supine: Min guard   General bed mobility comments: Assist for managing lines/tubes and safety  Transfers Overall transfer level: Needs assistance Equipment used: 4-wheeled walker Transfers: Sit to/from Stand Sit to Stand: Min guard         General transfer comment: Assist for safety and line management    Balance Overall balance assessment: Needs assistance Sitting-balance support: Feet supported Sitting balance-Leahy Scale: Fair     Standing balance support: No upper extremity supported Standing balance-Leahy  Scale: Fair Standing balance comment: supervision for static standing                           ADL either performed or assessed with clinical judgement   ADL Overall ADL's : Needs assistance/impaired Eating/Feeding: Modified independent;Bed level                       Toilet Transfer: Min guard;Ambulation;Comfort height toilet           Functional mobility during ADLs: Min guard;Rolling walker       Vision       Perception     Praxis      Cognition Arousal/Alertness: Awake/alert Behavior During Therapy: WFL for tasks assessed/performed Overall Cognitive Status: Impaired/Different from baseline Area of Impairment: Attention;Memory                   Current Attention Level: Selective Memory: Decreased short-term memory;Decreased recall of precautions         General Comments: Pt self reports that his memory isn't as good as it normally is.        Exercises     Shoulder Instructions       General Comments VSS.  Pt verbally instructed in how to switch from [facility] power <> battery, but deferred actual practice saying he's not ready yet due to his memory not being at baseline      Pertinent Vitals/ Pain       Pain Assessment: Faces Faces Pain Scale: Hurts even more Pain Location: sternal Pain Descriptors / Indicators: Grimacing Pain Intervention(s): Limited activity within patient's tolerance;Monitored  during session;Premedicated before session  Home Living                                          Prior Functioning/Environment              Frequency  Min 2X/week        Progress Toward Goals  OT Goals(current goals can now be found in the care plan section)  Progress towards OT goals: Progressing toward goals     Plan Discharge plan needs to be updated    Co-evaluation    PT/OT/SLP Co-Evaluation/Treatment: Yes Reason for Co-Treatment: Complexity of the patient's impairments (multi-system  involvement);For patient/therapist safety;To address functional/ADL transfers PT goals addressed during session: Mobility/safety with mobility OT goals addressed during session: ADL's and self-care;Strengthening/ROM      AM-PAC PT "6 Clicks" Daily Activity     Outcome Measure   Help from another person eating meals?: None Help from another person taking care of personal grooming?: A Little Help from another person toileting, which includes using toliet, bedpan, or urinal?: A Lot Help from another person bathing (including washing, rinsing, drying)?: A Lot Help from another person to put on and taking off regular upper body clothing?: A Lot Help from another person to put on and taking off regular lower body clothing?: A Lot 6 Click Score: 15    End of Session Equipment Utilized During Treatment: Oxygen  OT Visit Diagnosis: Unsteadiness on feet (R26.81)   Activity Tolerance Patient tolerated treatment well   Patient Left in bed;with call bell/phone within reach   Nurse Communication Mobility status        Time: 3154-0086 OT Time Calculation (min): 35 min  Charges: OT General Charges $OT Visit: 1 Visit OT Treatments $Therapeutic Activity: 8-22 mins  [facility], OTR/L [phone]    [name] M 05/05/2017, 1:30 PM

## 2017-05-05 NOTE — Care Management Note (Addendum)
Case Management Note  Patient Details  Name: David [name] MRN: [id number] Date of Birth: 1965/05/28  Subjective/Objective:  S/p LVAD implant 12/27, conts on dobutamine, co - x 81%, sob this am, urine output is less than 100cc in 24 hrs, CRRT was stopped on 1/10, transitioning to IHD, to place picc. He was already active with [facility] , will resume for PT and OT , but will need to see if still needs rollator at dc.     1/14 David Bamberger RN, BSN- tunneled cathter placed today, bleeding around VAD drive site, hepain on hold, he had his first dialysis on 1/12,will need IHD, has minimal urine output, conts on dobutamine.  Remains hopeful to regain renal function but looking slimmer per MD notes.  1/16 David Bamberger RN, BSN - POD 21 LVAD implant, conts with IHD, dobutamie off, hgb 7.8, conts on heaprin , lasix iv x 1 .  Discussed in LOS.  Patient is active with [facility] , he would like to stay with them when he goes [facility] with [facility] for PT/OT, will need resume orders at that time.                     Action/Plan: PTA Pt lived at home with spouse- was active with [facility] for home Dobutamine-HH services- PCP- Gilford Rile-  CM will follow post VAD implant for transition needs- anticipate return home with wife.      Expected Discharge Date:                  Expected Discharge Plan:  [place]  In-House Referral:     Discharge planning Services  CM Consult  Post Acute Care Choice:  Home Health, Resumption of Svcs/PTA Provider Choice offered to:  Patient  DME Arranged:    DME Agency:     HH Arranged:  RN [place] Agency:  [place]  Status of Service:  In process, will continue to follow  If discussed at Long Length of Stay Meetings, dates discussed:    Additional Comments:  Zenon Mayo, RN 05/05/2017, 11:52 AM

## 2017-05-05 NOTE — Progress Notes (Signed)
Patient ID: David [name], male   DOB: 1965-08-07, 52 y.o.   MRN: [id number] HeartMate 3 Rounding Note  Subjective:    Only complaints are of sternal pain and some shortness of breath at times. Remains on dobutamine 5 Co-ox 81.6 this am, repeat 59.8 CVP 14 Transitioned to intermittent HD   LVAD INTERROGATION:  HeartMate IIl LVAD:  Flow 4.7 liters/min, speed 5300, power 4, PI 3.5.    Objective:    Vital Signs:   Temp:  [97.9 F (36.6 C)-99.7 F (37.6 C)] 98.5 F (36.9 C) (01/11 1217) Pulse Rate:  [26-96] 89 (01/11 1400) Resp:  [11-25] 19 (01/11 1400) BP: (91-103)/(62-88) 95/77 (01/11 1200) SpO2:  [55 %-100 %] 98 % (01/11 1400) Weight:  [74.4 kg (164 lb 0.4 oz)] 74.4 kg (164 lb 0.4 oz) (01/11 0500) Last BM Date: 05/04/16 Mean arterial Pressure 70-80's  Intake/Output:   Intake/Output Summary (Last 24 hours) at 05/05/2017 1524 Last data filed at 05/05/2017 1300 Gross per 24 hour  Intake 980.49 ml  Output 150 ml  Net 830.49 ml     Physical Exam: General:  Chronically ill-appearing but awake and alert. Up in chair.  No resp difficulty HEENT: abrasions on nose from tape healing Neck: left IJ central line Cor: Normal heart sounds with LVAD hum present. Lungs: clear Abdomen: soft, nontender, nondistended. Good bowel sounds. Extremities: minimal edema in arms Neuro: alert & orientedx3,  moves all 4 extremities w/o difficulty. Affect pleasant  Telemetry: sinus rhythm 90's  Labs: Basic Metabolic Panel: Recent Labs  Lab 05/01/17 0408  05/02/17 0335  05/03/17 0335 05/03/17 1553 05/04/17 0402 05/04/17 1659 05/05/17 0344  NA 132*  132*   < > 129*   < > 131* 132* 131* 129* 128*  K 4.4  4.4   < > 4.4   < > 4.3 4.1 3.9 4.5 4.6  CL 98*  98*   < > 97*   < > 99* 99* 100* 97* 96*  CO2 27  27   < > 26   < > 27 26 26 26 25   GLUCOSE 104*  102*   < > 94   < > 78 102* 83 92 89  BUN 10  10   < > 10   < > 11 7 7 11 15   CREATININE 1.93*  1.88*   < > 1.93*   < > 1.84* 1.82*  1.77* 2.52* 3.32*  CALCIUM 7.9*  7.9*   < > 7.8*   < > 8.0* 7.8* 7.6* 7.8* 8.3*  MG 2.4  --  2.3  --  2.3  --  2.1  --  2.3  PHOS 2.2*   < > 2.2*   < > 2.1* 3.1 2.5 3.4 4.5   < > = values in this interval not displayed.    Liver Function Tests: Recent Labs  Lab 04/29/17 0353  04/30/17 0525  05/01/17 0408  05/02/17 0335  05/03/17 0335 05/03/17 1553 05/04/17 0402 05/04/17 1659 05/05/17 0344  AST 99*  --  109*  --  96*  --  92*  --   --   --   --   --   --   ALT 92*  --  88*  --  74*  --  66*  --   --   --   --   --   --   ALKPHOS 253*  --  397*  --  423*  --  458*  --   --   --   --   --   --  BILITOT 9.2*  --  8.5*  --  6.1*  --  5.2*  --   --   --   --   --   --   PROT 6.5  --  6.7  --  6.8  --  6.4*  --   --   --   --   --   --   ALBUMIN 1.8*  1.8*   < > 1.7*   < > 1.8*  1.7*   < > 1.6*   < > 1.7* 1.7* 1.6* 1.7* 1.8*   < > = values in this interval not displayed.   No results for input(s): LIPASE, AMYLASE in the last 168 hours. No results for input(s): AMMONIA in the last 168 hours.  CBC: Recent Labs  Lab 04/30/17 0525 05/02/17 0335 05/02/17 1321 05/03/17 0335 05/04/17 0402 05/05/17 0344  WBC 21.8*  --  22.6* 20.0* 19.3* 19.7*  HGB 8.5* 7.6* 9.3* 8.7* 8.4* 8.1*  HCT 26.1* 23.9* 28.7* 27.6* 27.2* 25.7*  MCV 78.1  --  80.6 80.7 82.2 82.4  PLT 265  --  321 336 361 364    INR: Recent Labs  Lab 05/01/17 0408 05/02/17 0335 05/03/17 0335 05/04/17 0402 05/05/17 0344  INR 2.23 1.70 1.65 1.91 1.60    Other results:  EKG:   Imaging: Dg Chest Port 1 View  Result Date: 05/05/2017 CLINICAL DATA:  Recent LVAD placement. EXAM: PORTABLE CHEST 1 VIEW COMPARISON:  04/28/2017; 04/27/2017 FINDINGS: Grossly unchanged enlarged cardiac silhouette and mediastinal contours. Post coronary stent placement. Stable position of support apparatus. No pneumothorax. Pulmonary vasculature remains indistinct with cephalization of flow. Minimal bibasilar heterogeneous opacities, left  greater than right, unchanged in favored to represent atelectasis. Suspected trace bilateral effusions. No acute osseus abnormalities. IMPRESSION: 1.  Stable positioning of support apparatus.  No pneumothorax. 2. Similar findings of cardiomegaly, mild pulmonary edema, trace pleural effusions and associated bibasilar opacities, likely atelectasis. Electronically Signed   By: Sandi Mariscal M.D.   On: 05/05/2017 09:17     Medications:     Scheduled Medications: . aspirin EC  81 mg Oral Daily  . atorvastatin  40 mg Oral q1800  . chlorhexidine  15 mL Mouth Rinse BID  . Chlorhexidine Gluconate Cloth  6 each Topical Daily  . citalopram  10 mg Oral Daily  . feeding supplement (ENSURE ENLIVE)  237 mL Oral TID BM  . mouth rinse  15 mL Mouth Rinse q12n4p  . pantoprazole  40 mg Oral Daily    Infusions: . sodium chloride Stopped (04/30/17 0800)  . sodium chloride    . DOBUTamine 5 mcg/kg/min (05/05/17 1300)  . heparin 1,100 Units/hr (05/05/17 1300)  . meropenem (MERREM) IV    . dialysis replacement fluid (prismasate) 400 mL/hr at 05/04/17 0212  . dialysis replacement fluid (prismasate) 200 mL/hr at 05/04/17 0221  . dialysate (PRISMASATE) 1,700 mL/hr at 05/04/17 0517  . sodium chloride      PRN Medications: heparin, levalbuterol, morphine injection, ondansetron (ZOFRAN) IV, oxyCODONE, oxymetazoline, simethicone, sodium chloride, sodium chloride flush, sodium chloride flush   Assessment:   1. Ischemic cardiomyopathy s/p multiple PCI's with chronic systolic heart failure and EF 20% on home dobutamine preop with significant improvement in symptoms. Evaluated at [facility] for heart transplant but LVAD felt to be the best treatment for him at this time. POD16s/p HeartMate III LVAD. He is critically ill with multi-system organ failure but improving.Hemodynamics stable on dobut 6. I think this can be very slowly weaned.  His Co-ox is 60.   2.H/OHypertension 3.H/OHyperlipidemia 4. PAF in  sinuspreopon amiodarone and Eliquis.Now back in sinus off amiodarone.  5.H/OStage III CKD. Acute on chronic postop renal failurewith anuria now on CRRT. He is making a little urine so still hopeful for recovery. Intermittent HD. Will need tunneled catheter 6. LBBB s/p CRT-D upgrade in 10/2016 without improvement in symptoms or EF. LV epicardial leads cut off at the time of surgery. 7. Prior smoking with mild obstruction and severe diffusion defect on PFT's. CXRhas been stable. Continue IS. 8. Postop leukocytosis and fever. WBC stalled at 19.7. Sputum culture grew a few achromobacter resistant to Maxipime so broadened to meropenem. Plan to stop soon.  9. Marked rise in bilirubin and transaminases. Likely due to pump runand right sided congestion, hypotension. 10. INR is1.60 today, down from 1.9 yesterday probably due to Ensure. Coumadin per pharmacy. Start heparin drip and hold Coumadin if getting a tunneled HD catheter    11. Severe malnutrition with albumin 1.8. Taking PO fairly well. Continue to encourage nutrition.    12. PT    13. Anemia: drop in Hgb most likely due to blood draws and loss in CRRT filters. No visible blood loss otherwise. Follow.     Plan/Discussion:    Overall he has been stable. MAP in good range off pressors. Slow dobutamine wean.  VAD flow has been great.  Intermittent HD. Plan tunneled catheter Monday per IR.  Continue to mobilize and encourage nutrition.  I reviewed the LVAD parameters from today, and compared the results to the patient's prior recorded data.  No programming changes were made.  The LVAD is functioning within specified parameters.   LVAD interrogation was negative for any significant power changes, alarms or PI events/speed drops.  LVAD equipment check completed and is in good working order.  Back-up equipment present.   LVAD education done on emergency procedures and precautions and reviewed exit site care.  Length of Stay: [address]  Fernande Boyden  [facility] 05/05/2017, 3:24 PM

## 2017-05-05 NOTE — Progress Notes (Addendum)
ANTICOAGULATION CONSULT NOTE - Follow Up Consult  Pharmacy Consult for Coumadin; add Heparin Indication: LVAD  Allergies  Allergen Reactions  . Plavix [Clopidogrel Bisulfate] Hives    Patient Measurements: Height: 5\' 5"  (165.1 cm) Weight: 164 lb 0.4 oz (74.4 kg) IBW/kg (Calculated) : 61.5   Vital Signs: Temp: 98.5 F (36.9 C) (01/11 1217) Temp Source: Oral (01/11 1217) BP: 91/79 (01/11 1000) Pulse Rate: 90 (01/11 1100)  Labs: Recent Labs    05/03/17 0335  05/03/17 2139 05/04/17 0402 05/04/17 1659 05/05/17 0344  HGB 8.7*  --   --  8.4*  --  8.1*  HCT 27.6*  --   --  27.2*  --  25.7*  PLT 336  --   --  361  --  364  LABPROT 19.4*  --   --  21.8*  --  18.9*  INR 1.65  --   --  1.91  --  1.60  HEPARINUNFRC  --   --  0.54 0.74*  --   --   CREATININE 1.84*   < >  --  1.77* 2.52* 3.32*   < > = values in this interval not displayed.    Estimated Creatinine Clearance: 24.3 mL/min (A) (by C-G formula based on SCr of 3.32 mg/dL (H)).   Assessment: 52 yo male s/p LVAD implantation 12/27.  Initiated on coumadin 12/28, then stopped after 2 doses due to shock liver/rising INR. INR back in goal range 1/6 and coumadin resumed per pharmacy dosing. INR trended down to 1.65 on 1/9 and heparin added.  Heparin was d/c'ed yesterday as INR essentially at goal, but today INR has trended back down to 1.6. This could be due to the ensure supplements which contain ~ 25% vitamin k. Will add back heparin and increase tonight's coumadin dose. LDH/CBC stable. No bleeding.  Goal of Therapy:  INR 2-2.5 Monitor platelets by anticoagulation protocol: Yes   Plan:  1) Coumadin 4mg  tonight 2) Restart heparin 1100 units/hr 3) 8 hour heparin level 4) Daily heparin level, CBC, INR  Nena Jordan, PharmD, BCPS 05/05/2017 1:35 PM    ADDENDUM: Now holding coumadin as patient needs HD tunneled catheter placed. Will follow up resuming coumadin after the procedure.   Nena Jordan, PharmD,  BCPS 05/05/2017, 2:00 PM

## 2017-05-05 NOTE — Progress Notes (Signed)
Speed  Flow  PI  Power  LVIDD  AI  Aortic openings  MR  TR  Septum  RV   5300  4.4 4.7 3.8 6.2 none 0/0 trace mild Bow to right   Mod HK   5400  5.0 3.6 4.0 6.0 none 0/0 trace mild Bow to right   5500  5.1 3.4 4.1 5.5 none 0/0 trace Mild  - mod More midline                                           Auto cuff BP: 115/79 (91)   Ramp ECHO performed at bedside per Dr. Haroldine Laws.   At completion of ramp study, patients primary controller programmed:   Fixed speed:  5400 Low speed limit:  5100  Zada Girt RN, VAD Coordinator 24/7 pager [phone]

## 2017-05-05 NOTE — Progress Notes (Signed)
57M with sCHF and ICM s/p HM3 LVAD 45/03 complicated by shock on pressors, respoiratory failure    Patient is on CRRT and appears to be tolerating fine - discussed management this morning with Dr Cyndia Bent -- interested in transitioning to intermittent hemodialysis to help with progression Will transition to intermittent dialysis   Tunnel catheter and will plan dialysis in AM   No appreciable urine output   Inflammatory markers improving     Objective:  Vital signs in last 24 hours:  Temp:  [97.4 F (36.3 C)-98.4 F (36.9 C)] 97.9 F (36.6 C) (01/07 0746) Pulse Rate:  [30-106] 35 (01/07 0900) Resp:  [19-37] 24 (01/07 0900) BP: (83-112)/(54-87) 97/75 (01/07 0800) SpO2:  [77 %-100 %] 77 % (01/07 0900) Weight:  [156 lb 4.9 oz (70.9 kg)] 156 lb 4.9 oz (70.9 kg) (01/07 0500)  Weight change: -15.7 oz (-0.9 kg)      Filed Weights   04/29/17 0433 04/30/17 0500 05/01/17 0500  Weight: 154 lb 15.7 oz (70.3 kg) 158 lb 4.6 oz (71.8 kg) 156 lb 4.9 oz (70.9 kg)    Intake/Output: I/O last 3 completed shifts: In: 2950.8 [P.O.:1580; I.V.:1070.8; IV Piggyback:300] Out: 5002 [Urine:200; Other:4802]   Intake/Output this shift:  Total I/O In: 172.4 [P.O.:120; I.V.:52.4] Out: 200 [Urine:50; Other:150]  CVS- RRRMechanical sound of LVAD RS- CTA ABD- BS present soft non-distended EXT- no edema     Basic Metabolic Panel: LastLabs             Recent Labs  Lab 04/27/17 0349  04/28/17 0400 04/28/17 1630 04/29/17 0353 04/29/17 2030 04/30/17 0525 04/30/17 1617 05/01/17 0408  NA 136   < > 136 135 135  135 129* 129* 130* 132*  132*  K 4.0   < > 4.0 4.0 4.1  4.0 4.3 4.2 4.5 4.4  4.4  CL 102   < > 103 101 100*  101 97* 95* 98* 98*  98*  CO2 24   < > 26 26 27  26 25 26 25 27  27   GLUCOSE 80   < > 96 124* 100*  101* 98 109* 104* 104*  102*  BUN 16   < > 13 12 13  12 10 11 9 10  10   CREATININE 1.64*   < > 1.69* 1.61* 1.68*  1.72* 1.65* 1.67* 1.76* 1.93*  1.88*   CALCIUM 8.1*   < > 8.0* 8.0* 7.8*  8.3* 7.6* 7.9* 7.9* 7.9*  7.9*  MG 2.6*  --  2.6*  --  2.3  --  2.4  --  2.4  PHOS 2.2*   < > 2.3* 1.9* 2.8 2.4*  --  2.2* 2.2*   < > = values in this interval not displayed.      Liver Function Tests: LastLabs              Recent Labs  Lab 04/27/17 0349 04/27/17 1050  04/28/17 0400  04/29/17 0353 04/29/17 2030 04/30/17 0525 04/30/17 1617 05/01/17 0408  AST 149*  --   --  119*  --  99*  --  109*  --  96*  ALT 144*  --   --  112*  --  92*  --  88*  --  74*  ALKPHOS 191*  --   --  209*  --  253*  --  397*  --  423*  BILITOT 10.7* 10.8*  --  10.7*  --  9.2*  --  8.5*  --  6.1*  PROT 6.5  --   --  6.5  --  6.5  --  6.7  --  6.8  ALBUMIN 1.9*  --    < > 1.8*   < > 1.8*  1.8* 1.8* 1.7* 1.8* 1.8*  1.7*   < > = values in this interval not displayed.     LastLabs  No results for input(s): LIPASE, AMYLASE in the last 168 hours.   LastLabs  No results for input(s): AMMONIA in the last 168 hours.    CBC: LastLabs         Recent Labs  Lab 04/25/17 0359 04/26/17 0343 04/27/17 0349 04/28/17 0400 04/30/17 0525  WBC 14.3* 13.5* 19.0* 21.4* 21.8*  NEUTROABS 11.8* 11.2* 16.6*  --   --   HGB 8.1* 8.9* 9.2* 8.9* 8.5*  HCT 25.1* 26.9* 28.7* 27.7* 26.1*  MCV 77.5* 78.4 77.2* 77.8* 78.1  PLT 118* 114* 133* 181 265      Cardiac Enzymes: LastLabs  No results for input(s): CKTOTAL, CKMB, CKMBINDEX, TROPONINI in the last 168 hours.    BNP: LastLabs  Invalid input(s): POCBNP    CBG: LastLabs         Recent Labs  Lab 04/26/17 1643 04/26/17 1951 04/26/17 2349 04/27/17 0406 04/27/17 0754  GLUCAP 78 82 104* 65 75      Microbiology:        Results for orders placed or performed during the hospital encounter of 04/19/17  Surgical pcr screen     Status: None   Collection Time: 04/19/17  1:58 PM  Result Value Ref Range Status   MRSA, PCR NEGATIVE NEGATIVE Final   Staphylococcus aureus NEGATIVE  NEGATIVE Final    Comment: (NOTE) The Xpert SA Assay (FDA approved for NASAL specimens in patients 55 years of age and older), is one component of a comprehensive surveillance program. It is not intended to diagnose infection nor to guide or monitor treatment.   Culture, blood (routine x 2)     Status: None   Collection Time: 04/22/17 11:25 AM  Result Value Ref Range Status   Specimen Description BLOOD RIGHT ARM  Final   Special Requests IN PEDIATRIC BOTTLE Blood Culture adequate volume  Final   Culture NO GROWTH 5 DAYS  Final   Report Status 04/27/2017 FINAL  Final  Culture, blood (routine x 2)     Status: None   Collection Time: 04/22/17 11:36 AM  Result Value Ref Range Status   Specimen Description BLOOD RIGHT HAND  Final   Special Requests IN PEDIATRIC BOTTLE Blood Culture adequate volume  Final   Culture NO GROWTH 5 DAYS  Final   Report Status 04/27/2017 FINAL  Final  Culture, respiratory (NON-Expectorated)     Status: None   Collection Time: 04/24/17  8:16 AM  Result Value Ref Range Status   Specimen Description TRACHEAL ASPIRATE  Final   Special Requests NONE  Final   Gram Stain   Final    MODERATE WBC PRESENT,BOTH PMN AND MONONUCLEAR NO ORGANISMS SEEN    Culture FEW ACHROMOBACTER SPECIES  Final   Report Status 04/27/2017 FINAL  Final   Organism ID, Bacteria ACHROMOBACTER SPECIES  Final      Susceptibility   Achromobacter species - MIC*    CEFEPIME >=64 RESISTANT Resistant     CEFAZOLIN >=64 RESISTANT Resistant     GENTAMICIN 4 SENSITIVE Sensitive     CIPROFLOXACIN 2 INTERMEDIATE Intermediate     IMIPENEM 1 SENSITIVE Sensitive  TRIMETH/SULFA <=20 SENSITIVE Sensitive     * FEW ACHROMOBACTER SPECIES  Urine Culture     Status: None   Collection Time: 04/25/17 12:38 PM  Result Value Ref Range Status   Specimen Description URINE, CATHETERIZED  Final   Special Requests Normal  Final   Culture NO GROWTH   Final   Report Status 04/26/2017 FINAL  Final    Coagulation Studies: RecentLabs(last2labs)       Recent Labs    04/29/17 0353 04/30/17 0525 05/01/17 0408  LABPROT 32.2* 25.9* 24.5*  INR 3.16 2.39 2.23      Urinalysis:  RecentLabs(last2labs)  No results for input(s): COLORURINE, LABSPEC, PHURINE, GLUCOSEU, HGBUR, BILIRUBINUR, KETONESUR, PROTEINUR, UROBILINOGEN, NITRITE, LEUKOCYTESUR in the last 72 hours.  Invalid input(s): APPERANCEUR      Imaging: ImagingResults(Last48hours)  No results found.     Medications:   . sodium chloride Stopped (04/30/17 0800)  . DOBUTamine 6 mcg/kg/min (05/01/17 0700)  . EPINEPHrine 4 mg in dextrose 5% 250 mL infusion (16 mcg/mL) 4 mcg/min (05/01/17 0700)  . meropenem (MERREM) IV Stopped (05/01/17 0458)  . norepinephrine (LEVOPHED) Adult infusion 4 mcg/min (05/01/17 0859)  . dialysis replacement fluid (prismasate) 400 mL/hr at 04/30/17 2116  . dialysis replacement fluid (prismasate) 200 mL/hr at 04/30/17 2215  . dialysate (PRISMASATE) 1,700 mL/hr at 05/01/17 0510  . sodium chloride     . aspirin EC  81 mg Oral Daily  . chlorhexidine  15 mL Mouth Rinse BID  . Chlorhexidine Gluconate Cloth  6 each Topical Daily  . feeding supplement (ENSURE ENLIVE)  237 mL Oral TID BM  . heparin injection (subcutaneous)  5,000 Units Subcutaneous Q8H  . mouth rinse  15 mL Mouth Rinse q12n4p  . pantoprazole  40 mg Oral Daily  . warfarin  1 mg Oral ONCE-1800  . Warfarin - Physician Dosing Inpatient   Does not apply q1800   heparin, levalbuterol, morphine injection, ondansetron (ZOFRAN) IV, oxyCODONE, sodium chloride, sodium chloride flush, sodium chloride flush  Assessment/ Plan:  1. 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 Pigment nephropathy  2. ICM s/p LVAD 12/27; on dobutamine  Weaning  3. Shock off NE/Epi per AHF + TCTS weaned pressors 4. VDRF, now  extubated 5. Hypophosphatemia, repleted 1/3;monitoring, now on diet might stablized [name]/o further IV phos 6. Elevated bilirubin; elevated transaminates, improving 7. Leukocytosis, +ancrhomobacter on trach aspirate 12/31 on meropenem; 12/31 B Cx x2 NGF 8. Anemia transfusing per AHF  P  1. Ready to transition to iHD; no evidence of GFR recovery although will  Monitor  Will tunnel catheter and plan dialysis in AM        LOS: 12 David [name] @TODAY @9 :02 AM

## 2017-05-05 NOTE — Progress Notes (Signed)
LVAD Coordinator Rounding Note:  Admitted12/26due to decompensated heart failure.  Heartmate IIILVAD [facility].AV valve oversewn due to aortic insufficiency.  Vital signs: Tmax: 98.0 HR:96 Doppler Pressure: 86 Automatic cuff:  103/80 (89) O2 Sat:95 % on 3LNC Wt:165>164>156>160>160>154>158>156>157>160>164lbs  LVAD interrogation reveals:  Speed:5300 Flow: 4.9 Power:3.8 w PI:3.2 Alarms:none Events:none Hematocrit:27 Fixed speed:5300 Low speed limit:5000  Labs:  LDH trend:205>407>576>923>967>612>528>492>467>489>441>434>405>376>355  INR trend:1.34>1.52>1.73>1.95>2.32>3.03>3.80>3.16>2.39>2.23>1.70>1.65>1.91>1.60  Anticoagulation Plan: -INR Goal:2-2.5  -ASA Dose: 81 mg   Blood Products: Intra op:2 units/FFP 04/20/17  Post op: PRBC's 1 unit 12/29 1 unit 12/30 1 unit 04/25/17  Device:[employer] BiV -Therapies:off  Arrhythmia: 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 (on amiodarone and Eliquis for this) - Afib 12/29 - 04/28/17 - NSR 1/5 - present  Nitric Oxide: -  stopped on 12/30 due to initiation of BiPap  Respiratory: - Extubated 12/28 - Reintubated 04/23/2017 - Extubated 04/26/2017  Renal:  04/23/17 - CRRT started for AKI - stopped 05/05/17  Infection: - 04/27/17 + 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 VAD coordinator to change VAD dressing today with weekend nurse Shanna  5. PT will work with patient today on switching power sources from battery to PM cable and back.  2. Please call VAD Pager for any equipment concerns or drive line dressing questions/concerns   Zada Girt RN, [place] Coordinator 24/7 pager [phone]

## 2017-05-06 LAB — ECHOCARDIOGRAM LIMITED
HEIGHTINCHES: 65 in
WEIGHTICAEL: 2624.36 [oz_av]

## 2017-05-06 LAB — RENAL FUNCTION PANEL
ALBUMIN: 1.7 g/dL — AB (ref 3.5–5.0)
ALBUMIN: 1.6 g/dL — AB (ref 3.5–5.0)
Anion gap: 7 (ref 5–15)
Anion gap: 10 (ref 5–15)
BUN: 25 mg/dL — ABNORMAL HIGH (ref 6–20)
BUN: 11 mg/dL (ref 6–20)
CALCIUM: 8.1 mg/dL — AB (ref 8.9–10.3)
CO2: 21 mmol/L — ABNORMAL LOW (ref 22–32)
CO2: 25 mmol/L (ref 22–32)
CREATININE: 4.63 mg/dL — AB (ref 0.61–1.24)
Calcium: 8.1 mg/dL — ABNORMAL LOW (ref 8.9–10.3)
Chloride: 94 mmol/L — ABNORMAL LOW (ref 101–111)
Chloride: 97 mmol/L — ABNORMAL LOW (ref 101–111)
Creatinine, Ser: 2.84 mg/dL — ABNORMAL HIGH (ref 0.61–1.24)
GFR calc Af Amer: 15 mL/min — ABNORMAL LOW (ref >60)
GFR calc Af Amer: 28 mL/min — ABNORMAL LOW (ref >60)
GFR calc non Af Amer: 24 mL/min — ABNORMAL LOW (ref >60)
GFR, EST NON AFRICAN AMERICAN: 13 mL/min — AB (ref >60)
GLUCOSE: 80 mg/dL (ref 65–99)
Glucose, Bld: 83 mg/dL (ref 65–99)
PHOSPHORUS: 3.9 mg/dL (ref 2.5–4.6)
POTASSIUM: 3.8 mmol/L (ref 3.5–5.1)
Phosphorus: 5.8 mg/dL — ABNORMAL HIGH (ref 2.5–4.6)
Potassium: 4.6 mmol/L (ref 3.5–5.1)
SODIUM: 122 mmol/L — AB (ref 135–145)
SODIUM: 132 mmol/L — AB (ref 135–145)

## 2017-05-06 LAB — CBC
HCT: 24.9 % — ABNORMAL LOW (ref 39.0–52.0)
Hemoglobin: 8 g/dL — ABNORMAL LOW (ref 13.0–17.0)
MCH: 26.5 pg (ref 26.0–34.0)
MCHC: 32.1 g/dL (ref 30.0–36.0)
MCV: 82.5 fL (ref 78.0–100.0)
PLATELETS: 376 10*3/uL (ref 150–400)
RBC: 3.02 MIL/uL — ABNORMAL LOW (ref 4.22–5.81)
RDW: 21.5 % — AB (ref 11.5–15.5)
WBC: 15.2 10*3/uL — ABNORMAL HIGH (ref 4.0–10.5)

## 2017-05-06 LAB — HEPARIN LEVEL (UNFRACTIONATED)
Heparin Unfractionated: 0.21 IU/mL — ABNORMAL LOW (ref 0.30–0.70)
Heparin Unfractionated: 0.45 IU/mL (ref 0.30–0.70)
Heparin Unfractionated: 0.37 IU/mL (ref 0.30–0.70)

## 2017-05-06 LAB — COOXEMETRY PANEL
Carboxyhemoglobin: 2.6 % — ABNORMAL HIGH (ref 0.5–1.5)
METHEMOGLOBIN: 0.9 % (ref 0.0–1.5)
O2 Saturation: 63.3 %
Total hemoglobin: 9 g/dL — ABNORMAL LOW (ref 12.0–16.0)

## 2017-05-06 LAB — PROTIME-INR
INR: 1.82
Prothrombin Time: 20.9 seconds — ABNORMAL HIGH (ref 11.4–15.2)

## 2017-05-06 LAB — LACTATE DEHYDROGENASE: LDH: 319 U/L — ABNORMAL HIGH (ref 98–192)

## 2017-05-06 LAB — MAGNESIUM: MAGNESIUM: 2.3 mg/dL (ref 1.7–2.4)

## 2017-05-06 NOTE — Progress Notes (Signed)
Exit site care:   Patient was not pre-medicated with IV morphine prior to dressing change.  Patient stated that he did not feel anything during the dressing change and that it was not hurting as it had previously.  Staff nurse Rachel Moulds (checked off by VAD coordinator) performed drive line exit wound care. Existing VAD dressing with bloody drainage noted; has been reinforced.  Dressing removed and site care performed using sterile technique. Drive line exit site cleaned with Chlora prep applicators x 2, allowed to dry, and aqaucell silver strips with daily dressing kit re-applied. Additional strips and gauze placed around/over exit site for bloody drainage. Exit siteis red with large amount bloody drainage noted under and around drive line.  Drainage is less than observed yesterday. The velour is not yetincorporated.No tenderness, foul odor or rash noted. Drive line anchor with bloody drainage; removed and replaced.   Bobette Mo

## 2017-05-06 NOTE — Progress Notes (Addendum)
Patient ID: David [name], male   DOB: [birth year], 52 y.o.   MRN: [id number]   Advanced Heart Failure VAD Team Note  Subjective:    David [name] is a 52 y.o. male with a PMH of ICM ('09 PCI to LAD, mRCA, '10 PCI to Lcx and '12 mRCA), HFrEF (EF ~20% for >5 years), HLD and HTN admitted 12/26 for VAD placement due to end-stage systolic HF.   Underwent successful HM-3 LVAD placement 04/20/17.  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   -Extubated 12/28.  -Developed AKI. CVVHD started 12/30 -ReIntubated 04/23/17  -Developed progressive shock with uptitration of norepinephrine and epinephrine and addition of vasopressin.  Milrinone stopped and dobutamine begun.  CXR with CHF.   -Re-extubated 04/26/2017 -Started on Meropenem 04/27/16 with + Achromobacter in tracheal aspirate. - CVVHD stopped 1/10  He remains on dobutamine 5, co-ox 63%.  UOP 125 cc yesterday.  Weight rising, CVP up to 15.  He was short of breath walking in hall this morning.   LDH 355 => 319 INR 1.6 => 1.8  Echo 12/31 reviewed: No significant pericardial effusion, IV septum midline to slightly to the right, RV function adequate.   LVAD INTERROGATION:  HeartMate III LVAD:  Flow  4.9 liters/min, speed 5400, power 4, PI 3.3.  No PI events.     Objective:    Vital Signs:   Temp:  [97.9 F (36.6 C)-98.9 F (37.2 C)] 98.9 F (37.2 C) (01/12 0400) Pulse Rate:  [58-100] 88 (01/12 0700) Resp:  [13-23] 18 (01/12 0700) BP: (86-113)/(64-103) 110/95 (01/12 0700) SpO2:  [93 %-100 %] 94 % (01/12 0700) Weight:  [171 lb 15.3 oz (78 kg)] 171 lb 15.3 oz (78 kg) (01/12 0500) Last BM Date: 05/04/16 Mean arterial Pressure 70-80s   Intake/Output:   Intake/Output Summary (Last 24 hours) at 05/06/2017 0806 Last data filed at 05/06/2017 0700 Gross per 24 hour  Intake 499.03 ml  Output 200 ml  Net 299.03 ml     Physical Exam   Physical Exam: CVP 15  GENERAL: Well appearing this am. NAD.  HEENT: Normal. NECK: Supple,  JVP 12-14 cm. Carotids OK.  CARDIAC:  Mechanical heart sounds with LVAD hum present.  LUNGS:  CTAB, normal effort.  ABDOMEN:  NT, ND, no HSM. No bruits or masses. +BS  LVAD exit site: Well-healed and incorporated. Dressing dry and intact. No erythema or drainage. Stabilization device present and accurately applied. Driveline dressing changed daily per sterile technique. EXTREMITIES:  Warm and dry. No cyanosis, clubbing, rash.  1+ ankle edema.  NEUROLOGIC:  Alert & oriented x 3. Cranial nerves grossly intact. Moves all 4 extremities w/o difficulty. Affect pleasant     Telemetry   NSR 90s (personally reviewed)  Labs   Basic Metabolic Panel: Recent Labs  Lab 05/02/17 0335  05/03/17 0335  05/04/17 0402 05/04/17 1659 05/05/17 0344 05/05/17 1653 05/06/17 0236  NA 129*   < > 131*   < > 131* 129* 128* 126* 122*  K 4.4   < > 4.3   < > 3.9 4.5 4.6 4.7 4.6  CL 97*   < > 99*   < > 100* 97* 96* 94* 94*  CO2 26   < > 27   < > 26 26 25 24  21*  GLUCOSE 94   < > 78   < > 83 92 89 102* 83  BUN 10   < > 11   < > 7 11 15  22* 25*  CREATININE 1.93*   < >  1.84*   < > 1.77* 2.52* 3.32* 4.09* 4.63*  CALCIUM 7.8*   < > 8.0*   < > 7.6* 7.8* 8.3* 8.2* 8.1*  MG 2.3  --  2.3  --  2.1  --  2.3  --  2.3  PHOS 2.2*   < > 2.1*   < > 2.5 3.4 4.5 5.4* 5.8*   < > = values in this interval not displayed.    Liver Function Tests: Recent Labs  Lab 04/30/17 0525  05/01/17 0408  05/02/17 0335  05/04/17 0402 05/04/17 1659 05/05/17 0344 05/05/17 1653 05/06/17 0236  AST 109*  --  96*  --  92*  --   --   --   --   --   --   ALT 88*  --  74*  --  66*  --   --   --   --   --   --   ALKPHOS 397*  --  423*  --  458*  --   --   --   --   --   --   BILITOT 8.5*  --  6.1*  --  5.2*  --   --   --   --   --   --   PROT 6.7  --  6.8  --  6.4*  --   --   --   --   --   --   ALBUMIN 1.7*   < > 1.8*  1.7*   < > 1.6*   < > 1.6* 1.7* 1.8* 1.6* 1.7*   < > = values in this interval not displayed.   No results for  input(s): LIPASE, AMYLASE in the last 168 hours. No results for input(s): AMMONIA in the last 168 hours.  CBC: Recent Labs  Lab 05/02/17 1321 05/03/17 0335 05/04/17 0402 05/05/17 0344 05/06/17 0236  WBC 22.6* 20.0* 19.3* 19.7* 15.2*  HGB 9.3* 8.7* 8.4* 8.1* 8.0*  HCT 28.7* 27.6* 27.2* 25.7* 24.9*  MCV 80.6 80.7 82.2 82.4 82.5  PLT 321 336 361 364 376    INR: Recent Labs  Lab 05/02/17 0335 05/03/17 0335 05/04/17 0402 05/05/17 0344 05/06/17 0236  INR 1.70 1.65 1.91 1.60 1.82    Other results:  Imaging   Dg Chest Port 1 View  Result Date: 05/05/2017 CLINICAL DATA:  Recent LVAD placement. EXAM: PORTABLE CHEST 1 VIEW COMPARISON:  04/28/2017; 04/27/2017 FINDINGS: Grossly unchanged enlarged cardiac silhouette and mediastinal contours. Post coronary stent placement. Stable position of support apparatus. No pneumothorax. Pulmonary vasculature remains indistinct with cephalization of flow. Minimal bibasilar heterogeneous opacities, left greater than right, unchanged in favored to represent atelectasis. Suspected trace bilateral effusions. No acute osseus abnormalities. IMPRESSION: 1.  Stable positioning of support apparatus.  No pneumothorax. 2. Similar findings of cardiomegaly, mild pulmonary edema, trace pleural effusions and associated bibasilar opacities, likely atelectasis. Electronically Signed   By: Sandi Mariscal M.D.   On: 05/05/2017 09:17     Medications:     Scheduled Medications: . aspirin EC  81 mg Oral Daily  . atorvastatin  40 mg Oral q1800  . chlorhexidine  15 mL Mouth Rinse BID  . Chlorhexidine Gluconate Cloth  6 each Topical Daily  . citalopram  10 mg Oral Daily  . feeding supplement (ENSURE ENLIVE)  237 mL Oral TID BM  . mouth rinse  15 mL Mouth Rinse q12n4p  . pantoprazole  40 mg Oral Daily    Infusions: . sodium chloride Stopped (  04/30/17 0800)  . sodium chloride    . DOBUTamine 5 mcg/kg/min (05/06/17 0700)  . heparin 1,500 Units/hr (05/06/17 0700)  .  meropenem (MERREM) IV Stopped (05/05/17 2133)  . sodium chloride      PRN Medications: heparin, levalbuterol, morphine injection, ondansetron (ZOFRAN) IV, oxyCODONE, oxymetazoline, simethicone, sodium chloride, sodium chloride flush, sodium chloride flush   Patient Profile   David [name] is a 52 y.o. male with a PMH of ICM ('09 PCI to LAD, mRCA, '10 PCI to Lcx and '12 mRCA), HFrEF (EF ~20% for >5 years) s/p HM-III VAD placement on 12/27 due to end-stage systolic HF.   Assessment/Plan:    1. Acute on chronic systolic HF: Echo 34/74/25 with LVEF 20-25%, Mild MR, Mild/Mod MR, Severe LAE, Mildly reduced RV, PA peak pressure 56 mm Hg.  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 Extubated, initially stopped epinephrine/norepinephrine but restarted with fall in UOP and MAP.  With progressive renal failure, pulmonary edema, and hypotension, he was re-intubated on 12/30.   CVVH was begun.  12/31 was difficult day with refractory shock (MAP in 60s).  Reviewed 12/31 echo which showed no significant pericardial effusion, adequate RV function, and IV septum near mid-line.  Off NE/Epi. Remains on dobutamine 5 mcg/kg/min with co-ox 63%.  Minimal UOP still, CVP 15 and weight rising.  - Decrease dobutamine to 3 today.  - Continue aspirin 81 mg daily.  - Warfarin goal INR 2-2.5. INR 1.8 today, coumadin held for tunneled catheter Monday.  He is on heparin gtt.  2. CAD s/p PCI to mid RCA and PLOM with DES x 2 and DES to mid LAD in 1/18.   - Continue ASA and statin. No change.   3. PAF: Remains in NSR  4. AKI on CKD stage III: Suspect some degree of intrinsic renal disease prior to LVAD placement (baseline creatinine around 1.5) with probable ATN from intra-op/peri-op hypotension and development of vasodilatory/septic shock.  CVVHD stopped 05/04/2017. Minimal UOP, weight rising, increased dyspnea.  - Discussed with Dr. Justin Mend today, will plan iHD today. Will need tunneled catheter  Monday.  5. Anemia expected: s/p 1 u PRBCs 12/30, 04/25/17, 1/8. Stable hgb. 6. ID: Started on cefepime empirically with septic/vasodilatory shock, PCT was 31 04/25/17.  ABX broadened to Meropenem 04/27/16 with + Achromobacter on tracheal aspirate. Stop after 10 days.  PCT 8.31 04/27/17 -> 3.87 ->0.94 - WBC lower at 15.  7. Elevated LFTs due to shock liver - Resolved.  8. Hyponatremia: Fluid restrict, HD today.  Length of Stay: 65  Loralie Champagne, MD 05/06/2017, 8:06 AM  VAD Team --- VAD ISSUES ONLY--- Pager [phone] (7am - 7am)  Advanced Heart Failure Team  Pager [phone] (M-F; 7a - 4p)  Please contact [facility] for night-coverage after hours (4p -7a ) and weekends on [email or web address]

## 2017-05-06 NOTE — Progress Notes (Signed)
Heparin level drawn per HD line set, arterial port (pre dialyzer).  Drawn during dialysis.

## 2017-05-06 NOTE — Progress Notes (Signed)
Arrived to patient room 2H-08.  Reviewed treatment plan and this RN agrees with plan.  Report received from bedside RN, Marita Kansas.  Consent obtained.  Patient A & O X 4.   Lung sounds diminished to ausculation in all fields. Generalized 1+ pitting edema. Cardiac:  NSR.  Removed caps and cleansed RIJ catheter with chlorhedxidine.  Aspirated ports of heparin and flushed them with saline per protocol.  Connected and secured lines, initiated treatment at 1315.  UF Goal of 2500 mL and net fluid removal 2 L.  Will continue to monitor.

## 2017-05-06 NOTE — Progress Notes (Signed)
[place] for Coumadin; add Heparin Indication: LVAD  Allergies  Allergen Reactions  . Plavix [Clopidogrel Bisulfate] Hives    Patient Measurements: Height: 5\' 5"  (165.1 cm) Weight: 164 lb 0.4 oz (74.4 kg) IBW/kg (Calculated) : 61.5   Vital Signs: Temp: 98.9 F (37.2 C) (01/12 0400) Temp Source: Oral (01/12 0400) BP: 99/88 (01/12 0400) Pulse Rate: 85 (01/12 0400)  Labs: Recent Labs    05/04/17 0402 05/04/17 1659 05/05/17 0344 05/05/17 1653 05/05/17 1938 05/06/17 0236  HGB 8.4*  --  8.1*  --   --  8.0*  HCT 27.2*  --  25.7*  --   --  24.9*  PLT 361  --  364  --   --  376  LABPROT 21.8*  --  18.9*  --   --  20.9*  INR 1.91  --  1.60  --   --  1.82  HEPARINUNFRC 0.74*  --   --  <0.10* 0.19* 0.21*  CREATININE 1.77* 2.52* 3.32* 4.09*  --   --     Estimated Creatinine Clearance: 19.7 mL/min (A) (by C-G formula based on SCr of 4.09 mg/dL (H)).   Assessment: 52 yo male s/p LVAD implantation 12/27.  Initiated on coumadin 12/28, then stopped after 2 doses due to shock liver/rising INR. INR back in goal range 1/6 and coumadin resumed per pharmacy dosing.  INR remains below goal at 1.65. Hgb stable 8.7. LDH stable 405. No bleeding reported.  Heparin levels continue to fluctuate, actually SUBtherapeutic this morning.  Goal of Therapy:  Heparin 0.3-0.7 units/ml INR 2-2.5 Monitor platelets by anticoagulation protocol: Yes    Plan: -Increase heparin to 15 units/hr -Daily HL, CBC -Check 6 hr level -F/u plan to resume warfarin  [name]  05/06/2017 4:40 AM

## 2017-05-06 NOTE — Progress Notes (Signed)
Patient ID: David [name], male   DOB: 1966-02-02, 52 y.o.   MRN: [id number] HeartMate 3 Rounding Note  Subjective:    Patient walked entire unit, oxygen saturation 94% Patient just spontaneously passed 200 cc of fairly clear urine Creatinine continues to rise, intermittent HD plan today Echocardiogram performed yesterday for VAD speed study personally reviewed. LVAD cannula with optimal orientation and RV function is satisfactory. Pump speed increased because of slight septal shift towards right side. No PI events LVAD INTERROGATION:  HeartMate IIl LVAD:  Flow 4.7 liters/min, speed 5400, power 4, PI 3.5.    Objective:    Vital Signs:   Temp:  [97.9 F (36.6 C)-98.9 F (37.2 C)] 97.9 F (36.6 C) (01/12 0822) Pulse Rate:  [72-100] 87 (01/12 0900) Resp:  [13-23] 17 (01/12 0900) BP: (86-113)/(71-103) 112/87 (01/12 0900) SpO2:  [93 %-100 %] 96 % (01/12 0900) Weight:  [171 lb 15.3 oz (78 kg)] 171 lb 15.3 oz (78 kg) (01/12 0500) Last BM Date: 05/04/16 Mean arterial Pressure 70-80's  Intake/Output:   Intake/Output Summary (Last 24 hours) at 05/06/2017 1108 Last data filed at 05/06/2017 0900 Gross per 24 hour  Intake 540.23 ml  Output 200 ml  Net 340.23 ml     Physical Exam: General:  Chronically ill-appearing but awake and alert. Up in chair.  No resp difficulty HEENT: abrasions on nose from tape healing Neck: left IJ central line Cor: Normal heart sounds with LVAD hum present. Lungs: clear Abdomen: soft, nontender, nondistended. Good bowel sounds. Extremities: minimal edema in arms Neuro: alert & orientedx3,  moves all 4 extremities w/o difficulty. Affect pleasant  Telemetry: sinus rhythm 90's  Labs: Basic Metabolic Panel: Recent Labs  Lab 05/02/17 0335  05/03/17 0335  05/04/17 0402 05/04/17 1659 05/05/17 0344 05/05/17 1653 05/06/17 0236  NA 129*   < > 131*   < > 131* 129* 128* 126* 122*  K 4.4   < > 4.3   < > 3.9 4.5 4.6 4.7 4.6  CL 97*   < > 99*   < > 100* 97*  96* 94* 94*  CO2 26   < > 27   < > 26 26 25 24  21*  GLUCOSE 94   < > 78   < > 83 92 89 102* 83  BUN 10   < > 11   < > 7 11 15  22* 25*  CREATININE 1.93*   < > 1.84*   < > 1.77* 2.52* 3.32* 4.09* 4.63*  CALCIUM 7.8*   < > 8.0*   < > 7.6* 7.8* 8.3* 8.2* 8.1*  MG 2.3  --  2.3  --  2.1  --  2.3  --  2.3  PHOS 2.2*   < > 2.1*   < > 2.5 3.4 4.5 5.4* 5.8*   < > = values in this interval not displayed.    Liver Function Tests: Recent Labs  Lab 04/30/17 0525  05/01/17 0408  05/02/17 0335  05/04/17 0402 05/04/17 1659 05/05/17 0344 05/05/17 1653 05/06/17 0236  AST 109*  --  96*  --  92*  --   --   --   --   --   --   ALT 88*  --  74*  --  66*  --   --   --   --   --   --   ALKPHOS 397*  --  423*  --  458*  --   --   --   --   --   --  BILITOT 8.5*  --  6.1*  --  5.2*  --   --   --   --   --   --   PROT 6.7  --  6.8  --  6.4*  --   --   --   --   --   --   ALBUMIN 1.7*   < > 1.8*  1.7*   < > 1.6*   < > 1.6* 1.7* 1.8* 1.6* 1.7*   < > = values in this interval not displayed.   No results for input(s): LIPASE, AMYLASE in the last 168 hours. No results for input(s): AMMONIA in the last 168 hours.  CBC: Recent Labs  Lab 05/02/17 1321 05/03/17 0335 05/04/17 0402 05/05/17 0344 05/06/17 0236  WBC 22.6* 20.0* 19.3* 19.7* 15.2*  HGB 9.3* 8.7* 8.4* 8.1* 8.0*  HCT 28.7* 27.6* 27.2* 25.7* 24.9*  MCV 80.6 80.7 82.2 82.4 82.5  PLT 321 336 361 364 376    INR: Recent Labs  Lab 05/02/17 0335 05/03/17 0335 05/04/17 0402 05/05/17 0344 05/06/17 0236  INR 1.70 1.65 1.91 1.60 1.82    Other results:  EKG:   Imaging: Dg Chest Port 1 View  Result Date: 05/05/2017 CLINICAL DATA:  Recent LVAD placement. EXAM: PORTABLE CHEST 1 VIEW COMPARISON:  04/28/2017; 04/27/2017 FINDINGS: Grossly unchanged enlarged cardiac silhouette and mediastinal contours. Post coronary stent placement. Stable position of support apparatus. No pneumothorax. Pulmonary vasculature remains indistinct with cephalization  of flow. Minimal bibasilar heterogeneous opacities, left greater than right, unchanged in favored to represent atelectasis. Suspected trace bilateral effusions. No acute osseus abnormalities. IMPRESSION: 1.  Stable positioning of support apparatus.  No pneumothorax. 2. Similar findings of cardiomegaly, mild pulmonary edema, trace pleural effusions and associated bibasilar opacities, likely atelectasis. Electronically Signed   By: Sandi Mariscal M.D.   On: 05/05/2017 09:17     Medications:     Scheduled Medications: . aspirin EC  81 mg Oral Daily  . atorvastatin  40 mg Oral q1800  . chlorhexidine  15 mL Mouth Rinse BID  . Chlorhexidine Gluconate Cloth  6 each Topical Daily  . citalopram  10 mg Oral Daily  . feeding supplement (ENSURE ENLIVE)  237 mL Oral TID BM  . mouth rinse  15 mL Mouth Rinse q12n4p  . pantoprazole  40 mg Oral Daily    Infusions: . sodium chloride Stopped (04/30/17 0800)  . sodium chloride    . DOBUTamine 3 mcg/kg/min (05/06/17 0954)  . heparin 1,500 Units/hr (05/06/17 0900)  . meropenem (MERREM) IV Stopped (05/05/17 2133)  . sodium chloride      PRN Medications: heparin, levalbuterol, morphine injection, ondansetron (ZOFRAN) IV, oxyCODONE, oxymetazoline, simethicone, sodium chloride, sodium chloride flush, sodium chloride flush   Assessment:   1. Ischemic cardiomyopathy s/p multiple PCI's with chronic systolic heart failure and EF 20% on home dobutamine preop with significant improvement in symptoms. [facility]uated at [facility] but LVAD felt to be the best treatment for him at this time. POD16s/p HeartMate III LVAD. He is critically ill with multi-system organ failure but improving.Hemodynamics stable  2.H/OHypertension 3.H/OHyperlipidemia 4. PAF in sinuspreopon amiodaron  5.H/OStage III CKD. Acute on chronic postop renal failurewith anuria . He is scheduled for intermittent HD today. He is scheduled for a PermCath HD catheter placement  Monday--Coumadin is on hold and patient is on heparin bridge. INR 1.8.    Plan/Discussion:    Overall he has been stable. MAP in good range on dobutamine . Mixed  venous saturation 63%  VAD flow has been > 4 liters per minute.  Intermittent HD. Plan tunneled catheter Monday per IR.  Continue to mobilize and encourage nutrition.  I reviewed the LVAD parameters from today, and compared the results to the patient's prior recorded data.  No programming changes were made.  The LVAD is functioning within specified parameters.   LVAD interrogation was negative for any significant power changes, alarms or PI events/speed drops.  LVAD equipment check completed and is in good working order.  Back-up equipment present.   LVAD education done on emergency procedures and precautions and reviewed exit site care.  Length of Stay: [place] III 05/06/2017, 11:08 AM

## 2017-05-06 NOTE — Progress Notes (Signed)
Dialysis treatment completed.  2500 mL ultrafiltrated.  2000 mL net fluid removal.  Patient status unchanged. Lung sounds diminished to ausculation in all fields. Generalized edema. Cardiac: NSR.  Cleansed RIJ catheter with chlorhexidine.  Disconnected lines and flushed ports with saline per protocol.  Ports locked with heparin and capped per protocol.    Report given to bedside, RN Marita [place].

## 2017-05-06 NOTE — Progress Notes (Signed)
19M with sCHF and ICM s/p HM3 LVAD 76/81 complicated by shock on pressors, respoiratory failure    Patient stopped CRRT 1/10  --  transitioning to intermittent hemodialysis to help with progression Will transition to intermittent dialysis   Tunnel catheter appreciate IR assistance  No appreciable urine output   Inflammatory markers improving     Objective:  Vital signs in last 24 hours:  Temp:  [97.4 F (36.3 C)-98.4 F (36.9 C)] 97.9 F (36.6 C) (01/07 0746) Pulse Rate:  [30-106] 35 (01/07 0900) Resp:  [19-37] 24 (01/07 0900) BP: (83-112)/(54-87) 97/75 (01/07 0800) SpO2:  [77 %-100 %] 77 % (01/07 0900) Weight:  [156 lb 4.9 oz (70.9 kg)] 156 lb 4.9 oz (70.9 kg) (01/07 0500)  Weight change: -15.7 oz (-0.9 kg)      Filed Weights   04/29/17 0433 04/30/17 0500 05/01/17 0500  Weight: 154 lb 15.7 oz (70.3 kg) 158 lb 4.6 oz (71.8 kg) 156 lb 4.9 oz (70.9 kg)    Intake/Output: I/O last 3 completed shifts: In: 2950.8 [P.O.:1580; I.V.:1070.8; IV Piggyback:300] Out: 5002 [Urine:200; Other:4802]   Intake/Output this shift:  Total I/O In: 172.4 [P.O.:120; I.V.:52.4] Out: 200 [Urine:50; Other:150]  CVS- RRRMechanical sound of LVAD RS- CTA ABD- BS present soft non-distended EXT- no edema     Basic Metabolic Panel: LastLabs             Recent Labs  Lab 04/27/17 0349  04/28/17 0400 04/28/17 1630 04/29/17 0353 04/29/17 2030 04/30/17 0525 04/30/17 1617 05/01/17 0408  NA 136   < > 136 135 135  135 129* 129* 130* 132*  132*  K 4.0   < > 4.0 4.0 4.1  4.0 4.3 4.2 4.5 4.4  4.4  CL 102   < > 103 101 100*  101 97* 95* 98* 98*  98*  CO2 24   < > 26 26 27  26 25 26 25 27  27   GLUCOSE 80   < > 96 124* 100*  101* 98 109* 104* 104*  102*  BUN 16   < > 13 12 13  12 10 11 9 10  10   CREATININE 1.64*   < > 1.69* 1.61* 1.68*  1.72* 1.65* 1.67* 1.76* 1.93*  1.88*  CALCIUM 8.1*   < > 8.0* 8.0* 7.8*  8.3* 7.6* 7.9* 7.9* 7.9*  7.9*  MG 2.6*  --  2.6*  --   2.3  --  2.4  --  2.4  PHOS 2.2*   < > 2.3* 1.9* 2.8 2.4*  --  2.2* 2.2*   < > = values in this interval not displayed.      Liver Function Tests: LastLabs              Recent Labs  Lab 04/27/17 0349 04/27/17 1050  04/28/17 0400  04/29/17 0353 04/29/17 2030 04/30/17 0525 04/30/17 1617 05/01/17 0408  AST 149*  --   --  119*  --  99*  --  109*  --  96*  ALT 144*  --   --  112*  --  92*  --  88*  --  74*  ALKPHOS 191*  --   --  209*  --  253*  --  397*  --  423*  BILITOT 10.7* 10.8*  --  10.7*  --  9.2*  --  8.5*  --  6.1*  PROT 6.5  --   --  6.5  --  6.5  --  6.7  --  6.8  ALBUMIN 1.9*  --    < > 1.8*   < > 1.8*  1.8* 1.8* 1.7* 1.8* 1.8*  1.7*   < > = values in this interval not displayed.     LastLabs  No results for input(s): LIPASE, AMYLASE in the last 168 hours.   LastLabs  No results for input(s): AMMONIA in the last 168 hours.    CBC: LastLabs         Recent Labs  Lab 04/25/17 0359 04/26/17 0343 04/27/17 0349 04/28/17 0400 04/30/17 0525  WBC 14.3* 13.5* 19.0* 21.4* 21.8*  NEUTROABS 11.8* 11.2* 16.6*  --   --   HGB 8.1* 8.9* 9.2* 8.9* 8.5*  HCT 25.1* 26.9* 28.7* 27.7* 26.1*  MCV 77.5* 78.4 77.2* 77.8* 78.1  PLT 118* 114* 133* 181 265      Cardiac Enzymes: LastLabs  No results for input(s): CKTOTAL, CKMB, CKMBINDEX, TROPONINI in the last 168 hours.    BNP: LastLabs  Invalid input(s): POCBNP    CBG: LastLabs         Recent Labs  Lab 04/26/17 1643 04/26/17 1951 04/26/17 2349 04/27/17 0406 04/27/17 0754  GLUCAP 78 82 104* 58 75      Microbiology:        Results for orders placed or performed during the hospital encounter of 04/19/17  Surgical pcr screen     Status: None   Collection Time: 04/19/17  1:58 PM  Result Value Ref Range Status   MRSA, PCR NEGATIVE NEGATIVE Final   Staphylococcus aureus NEGATIVE NEGATIVE Final    Comment: (NOTE) The Xpert SA Assay (FDA approved for NASAL specimens in  patients 57 years of age and older), is one component of a comprehensive surveillance program. It is not intended to diagnose infection nor to guide or monitor treatment.   Culture, blood (routine x 2)     Status: None   Collection Time: 04/22/17 11:25 AM  Result Value Ref Range Status   Specimen Description BLOOD RIGHT ARM  Final   Special Requests IN PEDIATRIC BOTTLE Blood Culture adequate volume  Final   Culture NO GROWTH 5 DAYS  Final   Report Status 04/27/2017 FINAL  Final  Culture, blood (routine x 2)     Status: None   Collection Time: 04/22/17 11:36 AM  Result Value Ref Range Status   Specimen Description BLOOD RIGHT HAND  Final   Special Requests IN PEDIATRIC BOTTLE Blood Culture adequate volume  Final   Culture NO GROWTH 5 DAYS  Final   Report Status 04/27/2017 FINAL  Final  Culture, respiratory (NON-Expectorated)     Status: None   Collection Time: 04/24/17  8:16 AM  Result Value Ref Range Status   Specimen Description TRACHEAL ASPIRATE  Final   Special Requests NONE  Final   Gram Stain   Final    MODERATE WBC PRESENT,BOTH PMN AND MONONUCLEAR NO ORGANISMS SEEN    Culture FEW ACHROMOBACTER SPECIES  Final   Report Status 04/27/2017 FINAL  Final   Organism ID, Bacteria ACHROMOBACTER SPECIES  Final      Susceptibility   Achromobacter species - MIC*    CEFEPIME >=64 RESISTANT Resistant     CEFAZOLIN >=64 RESISTANT Resistant     GENTAMICIN 4 SENSITIVE Sensitive     CIPROFLOXACIN 2 INTERMEDIATE Intermediate     IMIPENEM 1 SENSITIVE Sensitive     TRIMETH/SULFA <=20 SENSITIVE Sensitive     * FEW ACHROMOBACTER SPECIES  Urine Culture     Status:  None   Collection Time: 04/25/17 12:38 PM  Result Value Ref Range Status   Specimen Description URINE, CATHETERIZED  Final   Special Requests Normal  Final   Culture NO GROWTH  Final   Report Status 04/26/2017 FINAL  Final    Coagulation  Studies: RecentLabs(last2labs)       Recent Labs    04/29/17 0353 04/30/17 0525 05/01/17 0408  LABPROT 32.2* 25.9* 24.5*  INR 3.16 2.39 2.23      Urinalysis:  RecentLabs(last2labs)  No results for input(s): COLORURINE, LABSPEC, PHURINE, GLUCOSEU, HGBUR, BILIRUBINUR, KETONESUR, PROTEINUR, UROBILINOGEN, NITRITE, LEUKOCYTESUR in the last 72 hours.  Invalid input(s): APPERANCEUR      Imaging: ImagingResults(Last48hours)  No results found.     Medications:   . sodium chloride Stopped (04/30/17 0800)  . DOBUTamine 6 mcg/kg/min (05/01/17 0700)  . EPINEPHrine 4 mg in dextrose 5% 250 mL infusion (16 mcg/mL) 4 mcg/min (05/01/17 0700)  . meropenem (MERREM) IV Stopped (05/01/17 0458)  . norepinephrine (LEVOPHED) Adult infusion 4 mcg/min (05/01/17 0859)  . dialysis replacement fluid (prismasate) 400 mL/hr at 04/30/17 2116  . dialysis replacement fluid (prismasate) 200 mL/hr at 04/30/17 2215  . dialysate (PRISMASATE) 1,700 mL/hr at 05/01/17 0510  . sodium chloride     . aspirin EC  81 mg Oral Daily  . chlorhexidine  15 mL Mouth Rinse BID  . Chlorhexidine Gluconate Cloth  6 each Topical Daily  . feeding supplement (ENSURE ENLIVE)  237 mL Oral TID BM  . heparin injection (subcutaneous)  5,000 Units Subcutaneous Q8H  . mouth rinse  15 mL Mouth Rinse q12n4p  . pantoprazole  40 mg Oral Daily  . warfarin  1 mg Oral ONCE-1800  . Warfarin - Physician Dosing Inpatient   Does not apply q1800   heparin, levalbuterol, morphine injection, ondansetron (ZOFRAN) IV, oxyCODONE, sodium chloride, sodium chloride flush, sodium chloride flush  Assessment/ Plan:  1. 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 Pigment nephropathy  2. ICM s/p LVAD 12/27; on dobutamine  Weaning  3. Shock resolved 4. VDRF, now extubated 5. Hypophosphatemia, repleted 1/3;monitoring, now on diet might stablized w/o further IV  phos 6. Elevated bilirubin; elevated transaminates, improving 7. Leukocytosis, +ancrhomobacter on trach aspirate 12/31 on meropenem; 12/31 B Cx x2 NGF 8. Anemia transfusing per AHF         LOS: 12 David [name] W @TODAY @9 :02 AM

## 2017-05-06 NOTE — Progress Notes (Addendum)
PT Cancellation Note  Patient Details Name: David [name] MRN: [id number] DOB: 12-Feb-1966   Cancelled Treatment:    Reason Eval/Treat Not Completed: (P) Other (comment)(Pt receiving hemodialysis, will defer tx. )   [name] 05/06/2017, 3:03 PM  [name], PTA pager [phone]

## 2017-05-06 NOTE — Progress Notes (Signed)
[place] for Coumadin; add Heparin Indication: LVAD  Allergies  Allergen Reactions  . Plavix [Clopidogrel Bisulfate] Hives    Patient Measurements: Height: 5\' 5"  (165.1 cm) Weight: 171 lb 15.3 oz (78 kg) IBW/kg (Calculated) : 61.5   Vital Signs: Temp: 98 F (36.7 C) (01/12 1300) Temp Source: Oral (01/12 1147) BP: 96/58 (01/12 1515) Pulse Rate: 84 (01/12 1515)  Labs: Recent Labs    05/04/17 0402  05/05/17 0344 05/05/17 1653 05/05/17 1938 05/06/17 0236 05/06/17 1432  HGB 8.4*  --  8.1*  --   --  8.0*  --   HCT 27.2*  --  25.7*  --   --  24.9*  --   PLT 361  --  364  --   --  376  --   LABPROT 21.8*  --  18.9*  --   --  20.9*  --   INR 1.91  --  1.60  --   --  1.82  --   HEPARINUNFRC 0.74*  --   --  <0.10* 0.19* 0.21* 0.45  CREATININE 1.77*   < > 3.32* 4.09*  --  4.63*  --    < > = values in this interval not displayed.    Estimated Creatinine Clearance: 17.8 mL/min (A) (by C-G formula based on SCr of 4.63 mg/dL (H)).   Assessment: 52 yo male s/p LVAD implantation 12/27.   On hep pending line placement Monday. Hep lvl now within goal range at 0.45  Goal of Therapy:  Heparin 0.3-0.7 units/ml INR 2-2.5 Monitor platelets by anticoagulation protocol: Yes    Plan: Continue heparin 1500 units/hr Will confirm level at 2200 Daily HL, CBC F/u plan to resume warfarin  Levester Fresh, PharmD, BCPS, BCCCP Clinical Pharmacist Clinical phone for 05/06/2017 from 7a-3:30p: [phone] If after 3:30p, please call main pharmacy at: [phone] 05/06/2017 3:38 PM

## 2017-05-07 ENCOUNTER — Inpatient Hospital Stay (HOSPITAL_COMMUNITY): Payer: Medicare HMO

## 2017-05-07 LAB — CBC
HCT: 24.4 % — ABNORMAL LOW (ref 39.0–52.0)
HEMATOCRIT: 23.3 % — AB (ref 39.0–52.0)
HEMOGLOBIN: 7.5 g/dL — AB (ref 13.0–17.0)
Hemoglobin: 7.7 g/dL — ABNORMAL LOW (ref 13.0–17.0)
MCH: 26.6 pg (ref 26.0–34.0)
MCH: 26.3 pg (ref 26.0–34.0)
MCHC: 32.2 g/dL (ref 30.0–36.0)
MCHC: 31.6 g/dL (ref 30.0–36.0)
MCV: 82.6 fL (ref 78.0–100.0)
MCV: 83.3 fL (ref 78.0–100.0)
Platelets: 358 10*3/uL (ref 150–400)
Platelets: 377 10*3/uL (ref 150–400)
RBC: 2.82 MIL/uL — ABNORMAL LOW (ref 4.22–5.81)
RBC: 2.93 MIL/uL — ABNORMAL LOW (ref 4.22–5.81)
RDW: 21.2 % — AB (ref 11.5–15.5)
RDW: 21.1 % — ABNORMAL HIGH (ref 11.5–15.5)
WBC: 13.4 10*3/uL — ABNORMAL HIGH (ref 4.0–10.5)
WBC: 12.7 10*3/uL — ABNORMAL HIGH (ref 4.0–10.5)

## 2017-05-07 LAB — RENAL FUNCTION PANEL
ANION GAP: 10 (ref 5–15)
Albumin: 1.7 g/dL — ABNORMAL LOW (ref 3.5–5.0)
BUN: 19 mg/dL (ref 6–20)
CHLORIDE: 96 mmol/L — AB (ref 101–111)
CO2: 24 mmol/L (ref 22–32)
CREATININE: 4.36 mg/dL — AB (ref 0.61–1.24)
Calcium: 8.3 mg/dL — ABNORMAL LOW (ref 8.9–10.3)
GFR calc non Af Amer: 14 mL/min — ABNORMAL LOW (ref >60)
GFR, EST AFRICAN AMERICAN: 16 mL/min — AB (ref >60)
GLUCOSE: 92 mg/dL (ref 65–99)
Phosphorus: 5.1 mg/dL — ABNORMAL HIGH (ref 2.5–4.6)
Potassium: 4.6 mmol/L (ref 3.5–5.1)
Sodium: 130 mmol/L — ABNORMAL LOW (ref 135–145)

## 2017-05-07 LAB — BASIC METABOLIC PANEL
Anion gap: 10 (ref 5–15)
BUN: 16 mg/dL (ref 6–20)
CO2: 23 mmol/L (ref 22–32)
CREATININE: 3.81 mg/dL — AB (ref 0.61–1.24)
Calcium: 8.1 mg/dL — ABNORMAL LOW (ref 8.9–10.3)
Chloride: 96 mmol/L — ABNORMAL LOW (ref 101–111)
GFR calc Af Amer: 19 mL/min — ABNORMAL LOW (ref >60)
GFR, EST NON AFRICAN AMERICAN: 17 mL/min — AB (ref >60)
GLUCOSE: 86 mg/dL (ref 65–99)
POTASSIUM: 4.1 mmol/L (ref 3.5–5.1)
SODIUM: 129 mmol/L — AB (ref 135–145)

## 2017-05-07 LAB — HEPATIC FUNCTION PANEL
ALT: 32 U/L (ref 17–63)
AST: 45 U/L — ABNORMAL HIGH (ref 15–41)
Albumin: 1.7 g/dL — ABNORMAL LOW (ref 3.5–5.0)
Alkaline Phosphatase: 261 U/L — ABNORMAL HIGH (ref 38–126)
Bilirubin, Direct: 1.5 mg/dL — ABNORMAL HIGH (ref 0.1–0.5)
Indirect Bilirubin: 1.7 mg/dL — ABNORMAL HIGH (ref 0.3–0.9)
Total Bilirubin: 3.2 mg/dL — ABNORMAL HIGH (ref 0.3–1.2)
Total Protein: 7 g/dL (ref 6.5–8.1)

## 2017-05-07 LAB — MAGNESIUM: Magnesium: 1.9 mg/dL (ref 1.7–2.4)

## 2017-05-07 LAB — PREPARE RBC (CROSSMATCH)

## 2017-05-07 LAB — COOXEMETRY PANEL
Carboxyhemoglobin: 2 % — ABNORMAL HIGH (ref 0.5–1.5)
Methemoglobin: 1.2 % (ref 0.0–1.5)
O2 SAT: 61.2 %
TOTAL HEMOGLOBIN: 7.4 g/dL — AB (ref 12.0–16.0)

## 2017-05-07 LAB — HEPATITIS B SURFACE ANTIBODY,QUALITATIVE: HEP B S AB: REACTIVE

## 2017-05-07 LAB — HEPARIN LEVEL (UNFRACTIONATED): Heparin Unfractionated: 0.36 IU/mL (ref 0.30–0.70)

## 2017-05-07 LAB — PROTIME-INR
INR: 1.95
INR: 1.68
Prothrombin Time: 22.1 seconds — ABNORMAL HIGH (ref 11.4–15.2)
Prothrombin Time: 19.6 seconds — ABNORMAL HIGH (ref 11.4–15.2)

## 2017-05-07 LAB — HEPATITIS B SURFACE ANTIGEN: HEP B S AG: NEGATIVE

## 2017-05-07 LAB — HEPATITIS B CORE ANTIBODY, TOTAL: Hep B Core Total Ab: NEGATIVE

## 2017-05-07 LAB — LACTATE DEHYDROGENASE: LDH: 295 U/L — ABNORMAL HIGH (ref 98–192)

## 2017-05-07 MED ORDER — SODIUM CHLORIDE 0.9 % IV SOLN: Freq: Once | INTRAVENOUS | Status: DC

## 2017-05-07 MED ORDER — GABAPENTIN 100 MG PO CAPS
100.0000 mg | ORAL_CAPSULE | Freq: Two times a day (BID) | ORAL | Status: DC
  Administered 2017-05-07 – 2017-05-13 (×13): 100 mg via ORAL
  Filled 2017-05-07 (×14): qty 1

## 2017-05-07 MED ORDER — FE FUMARATE-B12-VIT C-FA-IFC PO CAPS
1.0000 | ORAL_CAPSULE | Freq: Two times a day (BID) | ORAL | Status: DC
  Administered 2017-05-07 – 2017-05-27 (×40): 1 via ORAL
  Filled 2017-05-07 (×41): qty 1

## 2017-05-07 NOTE — Progress Notes (Signed)
67M with sCHF and ICM s/p HM3 LVAD 12/27 and  sewed aortic valve  complicated by shock on pressors, respoiratory failure    Patient stopped CRRT 1/10  --  transitioning to intermittent hemodialysis to help with progression Will transition to intermittent dialysis   Tunnel catheter appreciate IR assistance 1/11  No appreciable urine output yet,  Although there seems to be a slight trend   Inflammatory markers improving     Objective:  Vital signs in last 24 hours:  Temp:  [97.4 F (36.3 C)-98.4 F (36.9 C)] 97.9 F (36.6 C) (01/07 0746) Pulse Rate:  [30-106] 35 (01/07 0900) Resp:  [19-37] 24 (01/07 0900) BP: (83-112)/(54-87) 97/75 (01/07 0800) SpO2:  [77 %-100 %] 77 % (01/07 0900) Weight:  [156 lb 4.9 oz (70.9 kg)] 156 lb 4.9 oz (70.9 kg) (01/07 0500)  Weight change: -15.7 oz (-0.9 kg)      Filed Weights   04/29/17 0433 04/30/17 0500 05/01/17 0500  Weight: 154 lb 15.7 oz (70.3 kg) 158 lb 4.6 oz (71.8 kg) 156 lb 4.9 oz (70.9 kg)    Intake/Output: I/O last 3 completed shifts: In: 2950.8 [P.O.:1580; I.V.:1070.8; IV Piggyback:300] Out: 5002 [Urine:200; Other:4802]   Intake/Output this shift:  Total I/O In: 172.4 [P.O.:120; I.V.:52.4] Out: 200 [Urine:50; Other:150]  CVS- RRRMechanical sound of LVAD RS- CTA ABD- BS present soft non-distended EXT- no edema     Basic Metabolic Panel: LastLabs             Recent Labs  Lab 04/27/17 0349  04/28/17 0400 04/28/17 1630 04/29/17 0353 04/29/17 2030 04/30/17 0525 04/30/17 1617 05/01/17 0408  NA 136   < > 136 135 135  135 129* 129* 130* 132*  132*  K 4.0   < > 4.0 4.0 4.1  4.0 4.3 4.2 4.5 4.4  4.4  CL 102   < > 103 101 100*  101 97* 95* 98* 98*  98*  CO2 24   < > 26 26 27  26 25 26 25 27  27   GLUCOSE 80   < > 96 124* 100*  101* 98 109* 104* 104*  102*  BUN 16   < > 13 12 13  12 10 11 9 10  10   CREATININE 1.64*   < > 1.69* 1.61* 1.68*  1.72* 1.65* 1.67* 1.76* 1.93*  1.88*  CALCIUM 8.1*   <  > 8.0* 8.0* 7.8*  8.3* 7.6* 7.9* 7.9* 7.9*  7.9*  MG 2.6*  --  2.6*  --  2.3  --  2.4  --  2.4  PHOS 2.2*   < > 2.3* 1.9* 2.8 2.4*  --  2.2* 2.2*   < > = values in this interval not displayed.      Liver Function Tests: LastLabs              Recent Labs  Lab 04/27/17 0349 04/27/17 1050  04/28/17 0400  04/29/17 0353 04/29/17 2030 04/30/17 0525 04/30/17 1617 05/01/17 0408  AST 149*  --   --  119*  --  99*  --  109*  --  96*  ALT 144*  --   --  112*  --  92*  --  88*  --  74*  ALKPHOS 191*  --   --  209*  --  253*  --  397*  --  423*  BILITOT 10.7* 10.8*  --  10.7*  --  9.2*  --  8.5*  --  6.1*  PROT 6.5  --   --  6.5  --  6.5  --  6.7  --  6.8  ALBUMIN 1.9*  --    < > 1.8*   < > 1.8*  1.8* 1.8* 1.7* 1.8* 1.8*  1.7*   < > = values in this interval not displayed.     LastLabs  No results for input(s): LIPASE, AMYLASE in the last 168 hours.   LastLabs  No results for input(s): AMMONIA in the last 168 hours.    CBC: LastLabs         Recent Labs  Lab 04/25/17 0359 04/26/17 0343 04/27/17 0349 04/28/17 0400 04/30/17 0525  WBC 14.3* 13.5* 19.0* 21.4* 21.8*  NEUTROABS 11.8* 11.2* 16.6*  --   --   HGB 8.1* 8.9* 9.2* 8.9* 8.5*  HCT 25.1* 26.9* 28.7* 27.7* 26.1*  MCV 77.5* 78.4 77.2* 77.8* 78.1  PLT 118* 114* 133* 181 265      Cardiac Enzymes: LastLabs  No results for input(s): CKTOTAL, CKMB, CKMBINDEX, TROPONINI in the last 168 hours.    BNP: LastLabs  Invalid input(s): POCBNP    CBG: LastLabs         Recent Labs  Lab 04/26/17 1643 04/26/17 1951 04/26/17 2349 04/27/17 0406 04/27/17 0754  GLUCAP 78 82 104* 86 75      Microbiology:        Results for orders placed or performed during the hospital encounter of 04/19/17  Surgical pcr screen     Status: None   Collection Time: 04/19/17  1:58 PM  Result Value Ref Range Status   MRSA, PCR NEGATIVE NEGATIVE Final   Staphylococcus aureus NEGATIVE NEGATIVE Final     Comment: (NOTE) The Xpert SA Assay (FDA approved for NASAL specimens in patients 23 years of age and older), is one component of a comprehensive surveillance program. It is not intended to diagnose infection nor to guide or monitor treatment.   Culture, blood (routine x 2)     Status: None   Collection Time: 04/22/17 11:25 AM  Result Value Ref Range Status   Specimen Description BLOOD RIGHT ARM  Final   Special Requests IN PEDIATRIC BOTTLE Blood Culture adequate volume  Final   Culture NO GROWTH 5 DAYS  Final   Report Status 04/27/2017 FINAL  Final  Culture, blood (routine x 2)     Status: None   Collection Time: 04/22/17 11:36 AM  Result Value Ref Range Status   Specimen Description BLOOD RIGHT HAND  Final   Special Requests IN PEDIATRIC BOTTLE Blood Culture adequate volume  Final   Culture NO GROWTH 5 DAYS  Final   Report Status 04/27/2017 FINAL  Final  Culture, respiratory (NON-Expectorated)     Status: None   Collection Time: 04/24/17  8:16 AM  Result Value Ref Range Status   Specimen Description TRACHEAL ASPIRATE  Final   Special Requests NONE  Final   Gram Stain   Final    MODERATE WBC PRESENT,BOTH PMN AND MONONUCLEAR NO ORGANISMS SEEN    Culture FEW ACHROMOBACTER SPECIES  Final   Report Status 04/27/2017 FINAL  Final   Organism ID, Bacteria ACHROMOBACTER SPECIES  Final      Susceptibility   Achromobacter species - MIC*    CEFEPIME >=64 RESISTANT Resistant     CEFAZOLIN >=64 RESISTANT Resistant     GENTAMICIN 4 SENSITIVE Sensitive     CIPROFLOXACIN 2 INTERMEDIATE Intermediate     IMIPENEM 1 SENSITIVE Sensitive  TRIMETH/SULFA <=20 SENSITIVE Sensitive     * FEW ACHROMOBACTER SPECIES  Urine Culture     Status: None   Collection Time: 04/25/17 12:38 PM  Result Value Ref Range Status   Specimen Description URINE, CATHETERIZED  Final   Special Requests Normal  Final   Culture NO GROWTH  Final   Report  Status 04/26/2017 FINAL  Final    Coagulation Studies: RecentLabs(last2labs)       Recent Labs    04/29/17 0353 04/30/17 0525 05/01/17 0408  LABPROT 32.2* 25.9* 24.5*  INR 3.16 2.39 2.23      Urinalysis:  RecentLabs(last2labs)  No results for input(s): COLORURINE, LABSPEC, PHURINE, GLUCOSEU, HGBUR, BILIRUBINUR, KETONESUR, PROTEINUR, UROBILINOGEN, NITRITE, LEUKOCYTESUR in the last 72 hours.  Invalid input(s): APPERANCEUR      Imaging: ImagingResults(Last48hours)  No results found.     Medications:   . sodium chloride Stopped (04/30/17 0800)  . DOBUTamine 6 mcg/kg/min (05/01/17 0700)  . EPINEPHrine 4 mg in dextrose 5% 250 mL infusion (16 mcg/mL) 4 mcg/min (05/01/17 0700)  . meropenem (MERREM) IV Stopped (05/01/17 0458)  . norepinephrine (LEVOPHED) Adult infusion 4 mcg/min (05/01/17 0859)  . dialysis replacement fluid (prismasate) 400 mL/hr at 04/30/17 2116  . dialysis replacement fluid (prismasate) 200 mL/hr at 04/30/17 2215  . dialysate (PRISMASATE) 1,700 mL/hr at 05/01/17 0510  . sodium chloride     . aspirin EC  81 mg Oral Daily  . chlorhexidine  15 mL Mouth Rinse BID  . Chlorhexidine Gluconate Cloth  6 each Topical Daily  . feeding supplement (ENSURE ENLIVE)  237 mL Oral TID BM  . heparin injection (subcutaneous)  5,000 Units Subcutaneous Q8H  . mouth rinse  15 mL Mouth Rinse q12n4p  . pantoprazole  40 mg Oral Daily  . warfarin  1 mg Oral ONCE-1800  . Warfarin - Physician Dosing Inpatient   Does not apply q1800   heparin, levalbuterol, morphine injection, ondansetron (ZOFRAN) IV, oxyCODONE, sodium chloride, sodium chloride flush, sodium chloride flush  Assessment/ Plan:  1. 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 Pigment nephropathy. It appears that the urine output is improving and would hope is a sign of recovery  He may or may not need dialysis tomorrow. I will not order  any and we shall follow labs  2. ICM s/p LVAD 12/27; on dobutamine  Weaning  3. Shock resolved 4. VDRF, now extubated 5. Hypophosphatemia, repleted 1/3;monitoring, now on diet might stablized w/o further IV phos 6. Elevated bilirubin; elevated transaminates, improving 7. Leukocytosis, +ancrhomobacter on trach aspirate 12/31 on meropenem; 12/31 B Cx x2 NGF 8. Anemia transfusing per AHF         LOS: 12 Geoge Lawrance W @TODAY @9 :02 AM

## 2017-05-07 NOTE — Progress Notes (Signed)
Patient ID: David [name], male   DOB: 07-11-1965, 52 y.o.   MRN: [id number] HeartMate 3 Rounding Note  Subjective:    Patient walked entire unit, oxygen saturation 94%  Patient has developed increasing serosanguineous drainage from around power cord exit site with drop in hemoglobin. One unit packed cells has been ordered. The patient's INR is 1.95 with heparin infusing at 1500 units per hour. Will stop systemic heparin at this time. Follow-up INR in 12 hours.  The driveline dressing will be changed as needed when it becomes saturated. The patient remains on cefepime, afebrile  Chest x-ray remains clear with cardiomegaly Patient passing small amounts of spontaneous urine daily Creatinine improved after HD yesterday Bilirubin improved to 3.2  Echocardiogram performed January 11 for VAD speed study personally reviewed. LVAD cannula with optimal orientation and RV function is satisfactory. Pump speed increased because of slight septal shift towards right side. No PI events LVAD INTERROGATION:  HeartMate IIl LVAD:  Flow 4.7 liters/min, speed 5400, power 4, PI 3.5.    Objective:    Vital Signs:   Temp:  [97.4 F (36.3 C)-98.7 F (37.1 C)] 98 F (36.7 C) (01/13 0400) Pulse Rate:  [25-127] 59 (01/13 0900) Resp:  [11-27] 11 (01/13 0900) BP: (77-136)/(38-82) 89/60 (01/13 0800) SpO2:  [63 %-100 %] 75 % (01/13 0900) Weight:  [167 lb 5.3 oz (75.9 kg)-171 lb 15.3 oz (78 kg)] 167 lb 5.3 oz (75.9 kg) (01/13 0500) Last BM Date: 05/04/17 Mean arterial Pressure 70-80's  Intake/Output:   Intake/Output Summary (Last 24 hours) at 05/07/2017 1031 Last data filed at 05/07/2017 0600 Gross per 24 hour  Intake 386.37 ml  Output 2250 ml  Net -1863.63 ml     Physical Exam: General:  Chronically ill-appearing but awake and alert. Up in chair.  No resp difficulty HEENT: abrasions on nose from tape healing Neck: left IJ central line Cor: Normal heart sounds with LVAD hum present. Lungs:  clear Abdomen: soft, nontender, nondistended. Good bowel sounds. Extremities: minimal edema in arms Neuro: alert & orientedx3,  moves all 4 extremities w/o difficulty. Affect pleasant  Telemetry: sinus rhythm 90's  Labs: Basic Metabolic Panel: Recent Labs  Lab 05/03/17 0335  05/04/17 0402 05/04/17 1659 05/05/17 0344 05/05/17 1653 05/06/17 0236 05/06/17 1732 05/07/17 0456  NA 131*   < > 131* 129* 128* 126* 122* 132* 129*  K 4.3   < > 3.9 4.5 4.6 4.7 4.6 3.8 4.1  CL 99*   < > 100* 97* 96* 94* 94* 97* 96*  CO2 27   < > 26 26 25 24  21* 25 23  GLUCOSE 78   < > 83 92 89 102* 83 80 86  BUN 11   < > 7 11 15  22* 25* 11 16  CREATININE 1.84*   < > 1.77* 2.52* 3.32* 4.09* 4.63* 2.84* 3.81*  CALCIUM 8.0*   < > 7.6* 7.8* 8.3* 8.2* 8.1* 8.1* 8.1*  MG 2.3  --  2.1  --  2.3  --  2.3  --  1.9  PHOS 2.1*   < > 2.5 3.4 4.5 5.4* 5.8* 3.9  --    < > = values in this interval not displayed.    Liver Function Tests: Recent Labs  Lab 05/01/17 0408  05/02/17 0335  05/05/17 0344 05/05/17 1653 05/06/17 0236 05/06/17 1732 05/07/17 0456  AST 96*  --  92*  --   --   --   --   --  45*  ALT 74*  --  66*  --   --   --   --   --  32  ALKPHOS 423*  --  458*  --   --   --   --   --  261*  BILITOT 6.1*  --  5.2*  --   --   --   --   --  3.2*  PROT 6.8  --  6.4*  --   --   --   --   --  7.0  ALBUMIN 1.8*  1.7*   < > 1.6*   < > 1.8* 1.6* 1.7* 1.6* 1.7*   < > = values in this interval not displayed.   No results for input(s): LIPASE, AMYLASE in the last 168 hours. No results for input(s): AMMONIA in the last 168 hours.  CBC: Recent Labs  Lab 05/03/17 0335 05/04/17 0402 05/05/17 0344 05/06/17 0236 05/07/17 0456  WBC 20.0* 19.3* 19.7* 15.2* 13.4*  HGB 8.7* 8.4* 8.1* 8.0* 7.5*  HCT 27.6* 27.2* 25.7* 24.9* 23.3*  MCV 80.7 82.2 82.4 82.5 82.6  PLT 336 361 364 376 358    INR: Recent Labs  Lab 05/03/17 0335 05/04/17 0402 05/05/17 0344 05/06/17 0236 05/07/17 0456  INR 1.65 1.91 1.60 1.82  1.95    Other results:  EKG:   Imaging: Dg Chest Port 1 View  Result Date: 05/07/2017 CLINICAL DATA:  LVAD EXAM: PORTABLE CHEST 1 VIEW COMPARISON:  05/05/2017 FINDINGS: Support devices are stable. Cardiomegaly. Interstitial prominence may reflect interstitial edema. No pneumothorax. No visible significant effusions. IMPRESSION: Suspect mild interstitial edema.  No change. Electronically Signed   By: Rolm Baptise M.D.   On: 05/07/2017 07:18     Medications:     Scheduled Medications: . aspirin EC  81 mg Oral Daily  . atorvastatin  40 mg Oral q1800  . chlorhexidine  15 mL Mouth Rinse BID  . Chlorhexidine Gluconate Cloth  6 each Topical Daily  . citalopram  10 mg Oral Daily  . feeding supplement (ENSURE ENLIVE)  237 mL Oral TID BM  . ferrous [id number] C-folic acid  1 capsule Oral BID BM  . gabapentin  100 mg Oral BID  . mouth rinse  15 mL Mouth Rinse q12n4p  . pantoprazole  40 mg Oral Daily    Infusions: . sodium chloride Stopped (04/30/17 0800)  . sodium chloride    . sodium chloride    . DOBUTamine 1.5 mcg/kg/min (05/07/17 0842)  . sodium chloride      PRN Medications: levalbuterol, ondansetron (ZOFRAN) IV, oxyCODONE, oxymetazoline, simethicone, sodium chloride, sodium chloride flush, sodium chloride flush   Assessment:   1. Ischemic cardiomyopathy s/p multiple PCI's with chronic systolic heart failure and EF 20% on home dobutamine preop with significant improvement in symptoms. Evaluated at [facility] for heart transplant but LVAD felt to be the best treatment for him at this time. POD16s/p HeartMate III LVAD. He is critically ill with multi-system organ failure but improving.Hemodynamics stable  2.H/OHypertension 3.H/OHyperlipidemia 4. PAF in sinuspreopon amiodaron  5.H/OStage III CKD. Acute on chronic postop renal failurewith anuria . He is scheduled for intermittent HD prn  he is scheduled for a PermCath HD catheter placement Monday--Coumadin is on  hold . Heparin now on hold because of bleeding from driveline site with therapeutic INR.    Plan/Discussion:    Overall he has been stable. MAP in good range on dobutamine. dobutamine decreased slightly today . Mixed venous saturation 61%  VAD flow has been > 4 liters per  minute.  Intermittent HD. Plan tunneled catheter Monday per IR.  Continue to mobilize and encourage nutrition. Recheck CBC this p.m. to keep hemoglobin greater than 8.0   I reviewed the LVAD parameters from today, and compared the results to the patient's prior recorded data.  No programming changes were made.  The LVAD is functioning within specified parameters.   LVAD interrogation was negative for any significant power changes, alarms or PI events/speed drops.  LVAD equipment check completed and is in good working order.  Back-up equipment present.   LVAD education done on emergency procedures and precautions and reviewed exit site care.  Length of Stay: Emerson III 05/07/2017, 10:31 AM

## 2017-05-07 NOTE — Progress Notes (Signed)
                      Signed           '[]'$ Hide copied text  '[]'$ Hover for details   Exit site care:  Patient was not pre-medicated withIV morphineprior to dressing change.  Patientstated that he did not feel anything during the dressing change and that it was not hurting as it had previously.  Staff nurse Levada Dy Arlinda Barcelona,RN(checked off by VAD coordinator) performed drive line exit wound care.Existing VAD dressingwith copious bloody drainage noted; had  been reinforced overnight. Dressingremoved and site care performed using sterile technique. Drive line exit site cleaned with Chlora prep applicators x 4, allowed to dry, and aqaucell silver stripswith daily dressing kit re-applied. Additional strips and gauze placed around/over exit site for bloody drainage.Exit siteis red withlarge amount bloody drainage noted under and around drive line.  Drainage is more than observed yesterday.The velour is not yetincorporated.No tenderness, foul odor or rash noted. Drive line anchorwith bloody drainage; removed and replaced.  Ellamae Sia

## 2017-05-07 NOTE — Plan of Care (Signed)
  Progressing Activity: Risk for activity intolerance will decrease 05/07/2017 0120 - Progressing by Burman Blacksmith, RN Note Pt walked 335ft 1/12 Cardiac: Ability to maintain an adequate cardiac output will improve 05/07/2017 0120 - Progressing by Burman Blacksmith, RN Education: Knowledge of the prescribed therapeutic regimen will improve 05/07/2017 0120 - Progressing by Burman Blacksmith, RN Note Education ongoing Coping: Level of anxiety will decrease 05/07/2017 0120 - Progressing by Burman Blacksmith, RN Fluid Volume: Risk for excess fluid volume will decrease 05/07/2017 0120 - Progressing by Burman Blacksmith, RN Note HD 1/12 Physical Regulation: Ability to maintain clinical measurements within normal limits will improve 05/07/2017 0120 - Progressing by Burman Blacksmith, RN Will remain free from infection 05/07/2017 0120 - Progressing by Burman Blacksmith, RN Respiratory: Ability to maintain adequate ventilation will improve 05/07/2017 0120 - Progressing by Burman Blacksmith, RN

## 2017-05-07 NOTE — Progress Notes (Signed)
Called David [name] in the blood bank and she stated she had blood products but had to talk to her supervisor prior to releasing them. Will continue to monitor.

## 2017-05-07 NOTE — Plan of Care (Signed)
  Progressing Activity: Risk for activity intolerance will decrease 05/07/2017 2254 - Progressing by Burman Blacksmith, RN Note Pt ambulated unit x2 Cardiac: Ability to maintain an adequate cardiac output will improve 05/07/2017 2254 - Progressing by Burman Blacksmith, RN Note Within parameters. Weaning vasoactive gtt Education: Knowledge of the prescribed therapeutic regimen will improve 05/07/2017 2254 - Progressing by Burman Blacksmith, RN Note Education ongoing Coping: Level of anxiety will decrease 05/07/2017 2254 - Progressing by Burman Blacksmith, RN Note Pain and anxiety decreasing per pt Fluid Volume: Risk for excess fluid volume will decrease 05/07/2017 2254 - Progressing by Burman Blacksmith, RN Note For HD 05/08/17 Physical Regulation: Ability to maintain clinical measurements within normal limits will improve 05/07/2017 2254 - Progressing by Burman Blacksmith, RN Will remain free from infection 05/07/2017 2254 - Progressing by Burman Blacksmith, RN Respiratory: Ability to maintain adequate ventilation will improve 05/07/2017 2254 - Progressing by Burman Blacksmith, RN Activity: Risk for activity intolerance will decrease 05/07/2017 2254 - Progressing by Burman Blacksmith, RN Cardiac: Ability to maintain an adequate cardiac output will improve 05/07/2017 2254 - Progressing by Burman Blacksmith, RN Education: Knowledge of the prescribed therapeutic regimen will improve 05/07/2017 2254 - Progressing by Burman Blacksmith, RN Coping: Level of anxiety will decrease 05/07/2017 2254 - Progressing by Burman Blacksmith, RN

## 2017-05-07 NOTE — Progress Notes (Signed)
Called Blood bank and Santiago Glad stated that she was still looking for blood products. Will continue to monitor.

## 2017-05-07 NOTE — Progress Notes (Signed)
Santiago Glad from blood bank called and stated she needed several vials of blood to help cross match patient for transfusion. Sent blood. Will continue to monitor.

## 2017-05-07 NOTE — Progress Notes (Signed)
Patient ID: David [name], male   DOB: [birth year], 52 y.o.   MRN: [id number]   Advanced Heart Failure VAD Team Note  Subjective:    David [name] is a 52 y.o. male with a PMH of ICM ('09 PCI to LAD, mRCA, '10 PCI to Lcx and '12 mRCA), HFrEF (EF ~20% for >5 years), HLD and HTN admitted 12/26 for VAD placement due to end-stage systolic HF.   Underwent successful HM-3 LVAD placement 04/20/17.  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   -Extubated 12/28.  -Developed AKI. CVVHD started 12/30 -ReIntubated 04/23/17  -Developed progressive shock with uptitration of norepinephrine and epinephrine and addition of vasopressin.  Milrinone stopped and dobutamine begun.  CXR with CHF.   -Re-extubated 04/26/2017 -Started on Meropenem 04/27/16 with + Achromobacter in tracheal aspirate. - CVVHD stopped 1/10 - 1st iHD 1/12  He remains on dobutamine 3, co-ox 61%.  UOP 200 cc yesterday, also had HD.  Weight decreased 4 lbs, still CVP 15.  Bleeding at driveline site this morning.  INR 1.95, heparin gtt held.  Main complaint remains pain.   LDH 355 => 319 => 295 INR 1.6 => 1.8 => 1.95  Echo 12/31 reviewed: No significant pericardial effusion, IV septum midline to slightly to the right, RV function adequate.   LVAD INTERROGATION:  HeartMate III LVAD:  Flow  4.7 liters/min, speed 5400, power 4, PI 4.2.  No PI events.     Objective:    Vital Signs:   Temp:  [97.4 F (36.3 C)-98.7 F (37.1 C)] 98 F (36.7 C) (01/13 0400) Pulse Rate:  [25-127] 84 (01/13 0700) Resp:  [11-27] 12 (01/13 0700) BP: (77-136)/(38-87) 81/63 (01/13 0700) SpO2:  [63 %-100 %] 95 % (01/13 0700) Weight:  [167 lb 5.3 oz (75.9 kg)-171 lb 15.3 oz (78 kg)] 167 lb 5.3 oz (75.9 kg) (01/13 0500) Last BM Date: 05/04/17 Mean arterial Pressure 70-80s   Intake/Output:   Intake/Output Summary (Last 24 hours) at 05/07/2017 0800 Last data filed at 05/07/2017 0600 Gross per 24 hour  Intake 427.57 ml  Output 2250 ml  Net -1822.43  ml     Physical Exam   Physical Exam: CVP 15  GENERAL: Well appearing this am. NAD.  HEENT: icteric. NECK: Supple, JVP 12 cm. Carotids OK.  CARDIAC:  Mechanical heart sounds with LVAD hum present.  LUNGS:  CTAB, normal effort.  ABDOMEN:  NT, ND, no HSM. No bruits or masses. +BS  LVAD exit site: Well-healed and incorporated. Dressing dry and intact. No erythema but some bleeding at driveline. Stabilization device present and accurately applied. Driveline dressing changed daily per sterile technique. EXTREMITIES:  Warm and dry. No cyanosis, clubbing, rash.  Trace ankle edema.   NEUROLOGIC:  Alert & oriented x 3. Cranial nerves grossly intact. Moves all 4 extremities w/o difficulty. Affect pleasant     Telemetry   NSR 80s-90s (personally reviewed)  Labs   Basic Metabolic Panel: Recent Labs  Lab 05/03/17 0335  05/04/17 0402 05/04/17 1659 05/05/17 0344 05/05/17 1653 05/06/17 0236 05/06/17 1732 05/07/17 0456  NA 131*   < > 131* 129* 128* 126* 122* 132* 129*  K 4.3   < > 3.9 4.5 4.6 4.7 4.6 3.8 4.1  CL 99*   < > 100* 97* 96* 94* 94* 97* 96*  CO2 27   < > 26 26 25 24  21* 25 23  GLUCOSE 78   < > 83 92 89 102* 83 80 86  BUN 11   < >  7 11 15  22* 25* 11 16  CREATININE 1.84*   < > 1.77* 2.52* 3.32* 4.09* 4.63* 2.84* 3.81*  CALCIUM 8.0*   < > 7.6* 7.8* 8.3* 8.2* 8.1* 8.1* 8.1*  MG 2.3  --  2.1  --  2.3  --  2.3  --  1.9  PHOS 2.1*   < > 2.5 3.4 4.5 5.4* 5.8* 3.9  --    < > = values in this interval not displayed.    Liver Function Tests: Recent Labs  Lab 05/01/17 0408  05/02/17 0335  05/05/17 0344 05/05/17 1653 05/06/17 0236 05/06/17 1732 05/07/17 0456  AST 96*  --  92*  --   --   --   --   --  45*  ALT 74*  --  66*  --   --   --   --   --  32  ALKPHOS 423*  --  458*  --   --   --   --   --  261*  BILITOT 6.1*  --  5.2*  --   --   --   --   --  3.2*  PROT 6.8  --  6.4*  --   --   --   --   --  7.0  ALBUMIN 1.8*  1.7*   < > 1.6*   < > 1.8* 1.6* 1.7* 1.6* 1.7*   < > =  values in this interval not displayed.   No results for input(s): LIPASE, AMYLASE in the last 168 hours. No results for input(s): AMMONIA in the last 168 hours.  CBC: Recent Labs  Lab 05/03/17 0335 05/04/17 0402 05/05/17 0344 05/06/17 0236 05/07/17 0456  WBC 20.0* 19.3* 19.7* 15.2* 13.4*  HGB 8.7* 8.4* 8.1* 8.0* 7.5*  HCT 27.6* 27.2* 25.7* 24.9* 23.3*  MCV 80.7 82.2 82.4 82.5 82.6  PLT 336 361 364 376 358    INR: Recent Labs  Lab 05/03/17 0335 05/04/17 0402 05/05/17 0344 05/06/17 0236 05/07/17 0456  INR 1.65 1.91 1.60 1.82 1.95    Other results:  Imaging   Dg Chest Port 1 View  Result Date: 05/07/2017 CLINICAL DATA:  LVAD EXAM: PORTABLE CHEST 1 VIEW COMPARISON:  05/05/2017 FINDINGS: Support devices are stable. Cardiomegaly. Interstitial prominence may reflect interstitial edema. No pneumothorax. No visible significant effusions. IMPRESSION: Suspect mild interstitial edema.  No change. Electronically Signed   By: Rolm Baptise M.D.   On: 05/07/2017 07:18     Medications:     Scheduled Medications: . aspirin EC  81 mg Oral Daily  . atorvastatin  40 mg Oral q1800  . chlorhexidine  15 mL Mouth Rinse BID  . Chlorhexidine Gluconate Cloth  6 each Topical Daily  . citalopram  10 mg Oral Daily  . feeding supplement (ENSURE ENLIVE)  237 mL Oral TID BM  . gabapentin  100 mg Oral BID  . mouth rinse  15 mL Mouth Rinse q12n4p  . pantoprazole  40 mg Oral Daily    Infusions: . sodium chloride Stopped (04/30/17 0800)  . sodium chloride    . sodium chloride    . DOBUTamine 3 mcg/kg/min (05/07/17 0600)  . sodium chloride      PRN Medications: heparin, levalbuterol, morphine injection, ondansetron (ZOFRAN) IV, oxyCODONE, oxymetazoline, simethicone, sodium chloride, sodium chloride flush, sodium chloride flush   Patient Profile   David [name] is a 52 y.o. male with a PMH of ICM ('09 PCI to LAD, mRCA, '10 PCI to Lcx and '  49 mRCA), HFrEF (EF ~20% for >5 years) s/p  HM-III VAD placement on 12/27 due to end-stage systolic HF.   Assessment/Plan:    1. Acute on chronic systolic HF: Echo 16/10/96 with LVEF 20-25%, Mild MR, Mild/Mod MR, Severe LAE, Mildly reduced RV, PA peak pressure 56 mm Hg.  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 Extubated, initially stopped epinephrine/norepinephrine but restarted with fall in UOP and MAP.  With progressive renal failure, pulmonary edema, and hypotension, he was re-intubated on 12/30.   CVVH was begun.  12/31 was difficult day with refractory shock (MAP in 60s).  Reviewed 12/31 echo which showed no significant pericardial effusion, adequate RV function, and IV septum near mid-line.  Off NE/Epi. Remains on dobutamine 2.5 mcg/kg/min with co-ox 61%.  Minimal UOP still (200 cc), CVP 15 but weight down with HD yesterday.  - Decrease dobutamine to 1.5 today.  - Continue aspirin 81 mg daily.  - Warfarin goal INR 2-2.5. INR 1.95 today, coumadin held for tunneled catheter Monday hopefully.  With bleeding from driveline site, stopped IV heparin gtt this morning.  Surgery to assess.  2. CAD s/p PCI to mid RCA and PLOM with DES x 2 and DES to mid LAD in 1/18.   - Continue ASA and statin. No change.   3. PAF: Remains in NSR  4. AKI on CKD stage III: Suspect some degree of intrinsic renal disease prior to LVAD placement (baseline creatinine around 1.5) with probable ATN from intra-op/peri-op hypotension and development of vasodilatory/septic shock.  CVVHD stopped 05/04/2017. Minimal UOP still.  He had first iHD yesterday without problems. Will need tunneled catheter hopefully Monday.  5. Anemia: s/p 1 u PRBCs 12/30, 04/25/17, 1/8. Bleeding at driveline site, hgb 7.5.  To get 1 unit PRBCs today.  6. ID: Started on cefepime empirically with septic/vasodilatory shock, PCT was 31 04/25/17.  ABX broadened to Meropenem 04/27/16 with + Achromobacter on tracheal aspirate. Stop after 10 days.  PCT 8.31 04/27/17 -> 3.87  ->0.94 - WBC lower at 13.  7. Elevated LFTs due to shock liver - LFTs trending down.  8. Hyponatremia: Better after HD.   Length of Stay: 85  Loralie Champagne, MD 05/07/2017, 8:00 AM  VAD Team --- VAD ISSUES ONLY--- Pager [phone] (7am - 7am)  Advanced Heart Failure Team  Pager [phone] (M-F; 7a - 4p)  Please contact [facility] for night-coverage after hours (4p -7a ) and weekends on [email or web address]

## 2017-05-07 NOTE — Progress Notes (Signed)
Called blood bank and Santiago Glad stated that she was having trouble locating compatible blood products. Will continue to monitor.

## 2017-05-07 NOTE — Progress Notes (Signed)
[place] for Coumadin; add Heparin Indication: LVAD  Allergies  Allergen Reactions  . Plavix [Clopidogrel Bisulfate] Hives   Patient Measurements: Height: 5\' 5"  (165.1 cm) Weight: 167 lb 8.8 oz (76 kg) IBW/kg (Calculated) : 61.5  Vital Signs: Temp: 97.4 F (36.3 C) (01/12 2000) Temp Source: Oral (01/12 2000) BP: 85/74 (01/12 2330) Pulse Rate: 60 (01/12 2330)  Labs: Recent Labs    05/04/17 0402  05/05/17 0344 05/05/17 1653  05/06/17 0236 05/06/17 1432 05/06/17 1732 05/06/17 2144  HGB 8.4*  --  8.1*  --   --  8.0*  --   --   --   HCT 27.2*  --  25.7*  --   --  24.9*  --   --   --   PLT 361  --  364  --   --  376  --   --   --   LABPROT 21.8*  --  18.9*  --   --  20.9*  --   --   --   INR 1.91  --  1.60  --   --  1.82  --   --   --   HEPARINUNFRC 0.74*  --   --  <0.10*   < > 0.21* 0.45  --  0.37  CREATININE 1.77*   < > 3.32* 4.09*  --  4.63*  --  2.84*  --    < > = values in this interval not displayed.   Estimated Creatinine Clearance: 28.6 mL/min (A) (by C-G formula based on SCr of 2.84 mg/dL (H)).  Assessment: 52 yo male s/p LVAD implantation 12/27.   On heparin pending line placement Monday.   Heparin level now therapeutic x2  Goal of Therapy:  Heparin 0.3-0.7 units/ml INR 2-2.5 Monitor platelets by anticoagulation protocol: Yes    Plan: Continue heparin 1500 units/hr Will confirm level at 2200 Daily heparin level, CBC F/u plan to resume warfarin  Georga Bora, PharmD Clinical Pharmacist 05/07/2017 12:10 AM

## 2017-05-07 NOTE — Progress Notes (Signed)
ANTICOAGULATION CONSULT NOTE - Follow Up Consult  Pharmacy Consult for Heparin / warfarin Indication: LVAD  Allergies  Allergen Reactions  . Plavix [Clopidogrel Bisulfate] Hives    Patient Measurements: Height: 5\' 5"  (165.1 cm) Weight: 167 lb 5.3 oz (75.9 kg) IBW/kg (Calculated) : 61.5   Vital Signs: Temp: 98 F (36.7 C) (01/13 0400) Temp Source: Oral (01/13 0400) BP: 81/63 (01/13 0700) Pulse Rate: 84 (01/13 0700)  Labs: Recent Labs    05/05/17 0344  05/06/17 0236 05/06/17 1432 05/06/17 1732 05/06/17 2144 05/07/17 0456  HGB 8.1*  --  8.0*  --   --   --  7.5*  HCT 25.7*  --  24.9*  --   --   --  23.3*  PLT 364  --  376  --   --   --  358  LABPROT 18.9*  --  20.9*  --   --   --  22.1*  INR 1.60  --  1.82  --   --   --  1.95  HEPARINUNFRC  --    < > 0.21* 0.45  --  0.37 0.36  CREATININE 3.32*   < > 4.63*  --  2.84*  --  3.81*   < > = values in this interval not displayed.    Estimated Creatinine Clearance: 21.3 mL/min (A) (by C-G formula based on SCr of 3.81 mg/dL (H)).   Assessment: 52 yo [name] s/p LVAD implantation [facility]  12/27. Pharmacy consulted for heparin drip; warfarin on hold pending HD tunneled cath placement.  Heparin level is therapeutic 0.37 on heparin drip 1500 uts/hr.  INR trending up 1.6> 1.95 despite warfarin on hold (last dose 1/10).  Oozing from driveline site with drop in h/h > PRBC today  LDH trending down, Tbili and LFTs trending down.    Goal of Therapy:  Heparin level 0.3-0.7 units/ml Monitor platelets by anticoagulation protocol: Yes   Plan:  Hold heparin today - reevaluate in am  Continue to hold warfarin  [place].D. CPP, BCPS Clinical Pharmacist [phone] 05/07/2017 8:12 AM

## 2017-05-07 NOTE — Progress Notes (Signed)
  Etta Quill, RN  Registered Nurse    Progress Notes  Signed  Date of Service:  05/07/2017 8:33 AM          Signed           '[]'$ Hide copied text  '[]'$ Hover for details                                              Signed            '[]'$ Hide copied text  '[]'$ Hover for details   Exit site care:  Patient was notpre-medicated withIV morphineprior to dressing change. Patientstated that he did not feel anything during the dressing change and that it was not hurting as it had previously.  Staff nurse Levada Dy Tinya Cadogan,RN(checked off by VAD coordinator)performeddrive line exit wound care.Existing VAD dressingwith copious bloody drainage noted; had  been reinforced overnight. Dressingremoved and site care performed using sterile technique. Drive line exit site cleaned with Chlora prep applicators x 4, allowed to dry, and aqaucell silver stripswith daily dressing kit re-applied. Additional strips and gauze placed around/over exit site for bloody drainage.Exit siteis red withlarge amount bloody drainage noted under and around drive line.Drainage is more than observed yesterday.The velour is not yetincorporated.No tenderness, foul odor or rash noted. Drive line anchorwith bloody drainage; removed and replaced.  Ellamae Sia

## 2017-05-07 NOTE — Progress Notes (Signed)
Dr. Prescott Gum called regarding bleeding around drive line. Orders received to stop heparin and transfuse one unit PRBC. Will continue to monitor. Eleonore Chiquito Rn 2 Heart

## 2017-05-08 ENCOUNTER — Inpatient Hospital Stay (HOSPITAL_COMMUNITY): Payer: Medicare HMO

## 2017-05-08 ENCOUNTER — Encounter (HOSPITAL_COMMUNITY): Payer: Self-pay | Admitting: Interventional Radiology

## 2017-05-08 ENCOUNTER — Other Ambulatory Visit: Payer: Self-pay | Admitting: Internal Medicine

## 2017-05-08 HISTORY — PX: IR US GUIDE VASC ACCESS RIGHT: IMG2390

## 2017-05-08 HISTORY — PX: IR FLUORO GUIDE CV LINE RIGHT: IMG2283

## 2017-05-08 LAB — RENAL FUNCTION PANEL
ALBUMIN: 1.8 g/dL — AB (ref 3.5–5.0)
ANION GAP: 11 (ref 5–15)
Albumin: 1.7 g/dL — ABNORMAL LOW (ref 3.5–5.0)
Anion gap: 12 (ref 5–15)
BUN: 21 mg/dL — ABNORMAL HIGH (ref 6–20)
BUN: 26 mg/dL — ABNORMAL HIGH (ref 6–20)
CALCIUM: 8.6 mg/dL — AB (ref 8.9–10.3)
CALCIUM: 8.3 mg/dL — AB (ref 8.9–10.3)
CHLORIDE: 94 mmol/L — AB (ref 101–111)
CO2: 25 mmol/L (ref 22–32)
CO2: 23 mmol/L (ref 22–32)
CREATININE: 4.97 mg/dL — AB (ref 0.61–1.24)
CREATININE: 5.4 mg/dL — AB (ref 0.61–1.24)
Chloride: 94 mmol/L — ABNORMAL LOW (ref 101–111)
GFR calc non Af Amer: 11 mL/min — ABNORMAL LOW (ref >60)
GFR, EST AFRICAN AMERICAN: 14 mL/min — AB (ref >60)
GFR, EST AFRICAN AMERICAN: 13 mL/min — AB (ref >60)
GFR, EST NON AFRICAN AMERICAN: 12 mL/min — AB (ref >60)
GLUCOSE: 109 mg/dL — AB (ref 65–99)
Glucose, Bld: 92 mg/dL (ref 65–99)
PHOSPHORUS: 5.7 mg/dL — AB (ref 2.5–4.6)
Phosphorus: 6.1 mg/dL — ABNORMAL HIGH (ref 2.5–4.6)
Potassium: 4.3 mmol/L (ref 3.5–5.1)
Potassium: 4.2 mmol/L (ref 3.5–5.1)
SODIUM: 129 mmol/L — AB (ref 135–145)
Sodium: 130 mmol/L — ABNORMAL LOW (ref 135–145)

## 2017-05-08 LAB — COOXEMETRY PANEL
CARBOXYHEMOGLOBIN: 2.9 % — AB (ref 0.5–1.5)
Carboxyhemoglobin: 2.5 % — ABNORMAL HIGH (ref 0.5–1.5)
Methemoglobin: 0.7 % (ref 0.0–1.5)
Methemoglobin: 0.8 % (ref 0.0–1.5)
O2 SAT: 57.1 %
O2 Saturation: 84.8 %
TOTAL HEMOGLOBIN: 12 g/dL (ref 12.0–16.0)
Total hemoglobin: 8.4 g/dL — ABNORMAL LOW (ref 12.0–16.0)

## 2017-05-08 LAB — CBC
HCT: 27.7 % — ABNORMAL LOW (ref 39.0–52.0)
Hemoglobin: 8.9 g/dL — ABNORMAL LOW (ref 13.0–17.0)
MCH: 27.2 pg (ref 26.0–34.0)
MCHC: 32.1 g/dL (ref 30.0–36.0)
MCV: 84.7 fL (ref 78.0–100.0)
PLATELETS: 360 10*3/uL (ref 150–400)
RBC: 3.27 MIL/uL — AB (ref 4.22–5.81)
RDW: 19.9 % — AB (ref 11.5–15.5)
WBC: 10.9 10*3/uL — AB (ref 4.0–10.5)

## 2017-05-08 LAB — BPAM RBC
Blood Product Expiration Date: 201901262359
Unit Type and Rh: 5100

## 2017-05-08 LAB — TYPE AND SCREEN
ABO/RH(D): O POS
Antibody Screen: POSITIVE
DAT, IgG: POSITIVE
Donor AG Type: NEGATIVE
Unit division: 0

## 2017-05-08 LAB — LACTATE DEHYDROGENASE: LDH: 315 U/L — ABNORMAL HIGH (ref 98–192)

## 2017-05-08 LAB — PROTIME-INR
INR: 1.43
PROTHROMBIN TIME: 17.3 s — AB (ref 11.4–15.2)

## 2017-05-08 LAB — MAGNESIUM: MAGNESIUM: 2.2 mg/dL (ref 1.7–2.4)

## 2017-05-08 MED ORDER — FENTANYL CITRATE (PF) 100 MCG/2ML IJ SOLN
INTRAMUSCULAR | Status: AC | PRN
  Administered 2017-05-08 (×2): 25 ug via INTRAVENOUS

## 2017-05-08 MED ORDER — CEFAZOLIN SODIUM-DEXTROSE 2-4 GM/100ML-% IV SOLN
INTRAVENOUS | Status: AC
  Filled 2017-05-08: qty 100

## 2017-05-08 MED ORDER — WARFARIN SODIUM 2 MG PO TABS
2.0000 mg | ORAL_TABLET | Freq: Once | ORAL | Status: AC
  Administered 2017-05-08: 2 mg via ORAL
  Filled 2017-05-08: qty 1

## 2017-05-08 MED ORDER — MIDAZOLAM HCL 2 MG/2ML IJ SOLN
INTRAMUSCULAR | Status: AC
  Administered 2017-05-08: 12:00:00
  Filled 2017-05-08: qty 4

## 2017-05-08 MED ORDER — "THROMBI-PAD 3""X3"" EX PADS"
1.0000 | MEDICATED_PAD | Freq: Once | CUTANEOUS | Status: AC
  Administered 2017-05-08: 1 via TOPICAL
  Filled 2017-05-08: qty 1

## 2017-05-08 MED ORDER — LIDOCAINE HCL (PF) 1 % IJ SOLN
INTRAMUSCULAR | Status: AC
  Filled 2017-05-08: qty 30

## 2017-05-08 MED ORDER — FENTANYL CITRATE (PF) 100 MCG/2ML IJ SOLN
INTRAMUSCULAR | Status: AC
  Administered 2017-05-08: 12:00:00
  Filled 2017-05-08: qty 2

## 2017-05-08 MED ORDER — DEXTROSE 5 % IV SOLN
160.0000 mg | Freq: Once | INTRAVENOUS | Status: AC
  Administered 2017-05-08: 160 mg via INTRAVENOUS
  Filled 2017-05-08: qty 10

## 2017-05-08 MED ORDER — MIDAZOLAM HCL 2 MG/2ML IJ SOLN
INTRAMUSCULAR | Status: AC | PRN
  Administered 2017-05-08: 0.5 mg via INTRAVENOUS
  Administered 2017-05-08: 1 mg via INTRAVENOUS

## 2017-05-08 MED ORDER — HEPARIN SODIUM (PORCINE) 1000 UNIT/ML IJ SOLN
INTRAMUSCULAR | Status: AC
  Administered 2017-05-08: 3.8 mL
  Filled 2017-05-08: qty 1

## 2017-05-08 MED ORDER — CEFAZOLIN SODIUM-DEXTROSE 2-4 GM/100ML-% IV SOLN
2.0000 g | Freq: Once | INTRAVENOUS | Status: AC
  Administered 2017-05-08: 2 g via INTRAVENOUS

## 2017-05-08 MED ORDER — METOLAZONE 5 MG PO TABS
5.0000 mg | ORAL_TABLET | Freq: Once | ORAL | Status: AC
  Administered 2017-05-08: 5 mg via ORAL
  Filled 2017-05-08: qty 1

## 2017-05-08 MED ORDER — LIDOCAINE HCL (PF) 1 % IJ SOLN
INTRAMUSCULAR | Status: AC | PRN
  Administered 2017-05-08: 5 mL

## 2017-05-08 MED ORDER — WARFARIN - PHARMACIST DOSING INPATIENT
Freq: Every day | Status: DC
  Administered 2017-05-11 – 2017-05-23 (×3)

## 2017-05-08 MED ORDER — LIDOCAINE HCL 1 % IJ SOLN
INTRAMUSCULAR | Status: AC
  Filled 2017-05-08: qty 20

## 2017-05-08 MED ORDER — CHLORHEXIDINE GLUCONATE 4 % EX LIQD
CUTANEOUS | Status: AC
  Filled 2017-05-08: qty 15

## 2017-05-08 NOTE — Progress Notes (Signed)
CT surgery p.m. Rounds  Patient tolerated placement of tunneled hemodialysis catheter well and returned to his room and passed 550 cc of urine Currently resting comfortably.

## 2017-05-08 NOTE — Progress Notes (Signed)
[place] for Coumadin/Heparin Indication: LVAD  Allergies  Allergen Reactions  . Plavix [Clopidogrel Bisulfate] Hives   Patient Measurements: Height: 5\' 5"  (165.1 cm) Weight: 167 lb 8.8 oz (76 kg) IBW/kg (Calculated) : 61.5  Vital Signs: Temp: 98.5 F (36.9 C) (01/14 0300) Temp Source: Oral (01/14 0300) BP: 86/73 (01/14 0700) Pulse Rate: 86 (01/14 0700)  Labs: Recent Labs    05/06/17 1432  05/06/17 2144 05/07/17 0456 05/07/17 1530 05/08/17 0530  HGB  --   --   --  7.5* 7.7* 8.9*  HCT  --   --   --  23.3* 24.4* 27.7*  PLT  --   --   --  358 377 360  LABPROT  --   --   --  22.1* 19.6* 17.3*  INR  --   --   --  1.95 1.68 1.43  HEPARINUNFRC 0.45  --  0.37 0.36  --   --   CREATININE  --    < >  --  3.81* 4.36* 4.97*   < > = values in this interval not displayed.   Estimated Creatinine Clearance: 16.4 mL/min (A) (by C-G formula based on SCr of 4.97 mg/dL (H)).  Assessment: 52 yo male s/p LVAD implantation 12/27.   IV heparin and Coumadin on hold for tunneled catheter today.  Also with bleeding from driveline site.  Spoke to Dr. Cyndia Bent, planning to hold all anticoagulation until bleeding stops.  Goal of Therapy:  Heparin 0.3-0.7 units/ml INR 2-2.5 Monitor platelets by anticoagulation protocol: Yes    Plan: Hold heparin and Coumadin for now. F/u plans to resume anticoagulation when bleeding stops.  [name], BCPS  Clinical Pharmacist Pager [phone]  05/08/2017 7:42 AM

## 2017-05-08 NOTE — Procedures (Signed)
  Procedure: R IJ tunneled HD cath placement Palindrome 23 EBL:   minimal Complications:  none immediate  See full dictation in [facility].  Dillard Cannon MD Main # [phone] Pager  [phone]

## 2017-05-08 NOTE — Progress Notes (Signed)
[place] for Coumadin/Heparin Indication: LVAD  Allergies  Allergen Reactions  . Plavix [Clopidogrel Bisulfate] Hives   Patient Measurements: Height: 5\' 5"  (165.1 cm) Weight: 167 lb 8.8 oz (76 kg) IBW/kg (Calculated) : 61.5  Vital Signs: Temp: 97.6 F (36.4 C) (01/14 1309) Temp Source: Oral (01/14 1309) BP: 85/71 (01/14 1400) Pulse Rate: 89 (01/14 1400)  Labs: Recent Labs    05/06/17 1432  05/06/17 2144 05/07/17 0456 05/07/17 1530 05/08/17 0530  HGB  --   --   --  7.5* 7.7* 8.9*  HCT  --   --   --  23.3* 24.4* 27.7*  PLT  --   --   --  358 377 360  LABPROT  --   --   --  22.1* 19.6* 17.3*  INR  --   --   --  1.95 1.68 1.43  HEPARINUNFRC 0.45  --  0.37 0.36  --   --   CREATININE  --    < >  --  3.81* 4.36* 4.97*   < > = values in this interval not displayed.   Estimated Creatinine Clearance: 16.4 mL/min (A) (by C-G formula based on SCr of 4.97 mg/dL (H)).  Assessment: 52 yo male s/p LVAD implantation 12/27.   IV heparin and Coumadin on hold for tunneled catheter today.  Also with bleeding from driveline site.  Spoke to Dr. Cyndia Bent, planning to hold all anticoagulation until bleeding stops.  PM f/u - spoke to Dr. Cyndia Bent, okay to restart Coumadin tonight.  Will re-evaluate for consideration of heparin in the morning.  Has been very sensitive to Coumadin this admission.  Goal of Therapy:  Heparin 0.3-0.7 units/ml INR 2-2.5 Monitor platelets by anticoagulation protocol: Yes    Plan: Coumadin 2 mg po x 1 tonight. Daily PT/INR.  [name], BCPS  Clinical Pharmacist Pager [phone]  05/08/2017 3:11 PM

## 2017-05-08 NOTE — Progress Notes (Addendum)
PT Cancellation Note  Patient Details Name: David [name] MRN: [id number] DOB: Sep 18, 1965   Cancelled Treatment:    Reason Eval/Treat Not Completed: Patient at procedure or test/unavailable. Checked on pt earlier and he had recently returned to bed and had amb with 3rd shift. Checked back and now pt in radiology and likely to have HD later.   [name] 05/08/2017, 11:32 AM  Suanne Marker PT [phone]

## 2017-05-08 NOTE — Progress Notes (Signed)
CSW met with patient at bedside. Patient in good spirits and states family visited over the weekend. Patient was sleepy after having a procedure this afternoon and was looking forward to taking a nap. CSW continues to follow for support throughout implant hospitalization. Raquel Sarna, [place], [place]

## 2017-05-08 NOTE — Progress Notes (Signed)
VAD Coordinator Procedure Note:   Patient underwent tunneled HD placement in IR. Hemodynamics and VAD parameters monitored by me and scrub nurse throughout the procedure. MAPs were obtained with automatic BP cuff on left arm.      Auto cuff(MAP):  Flow: PI: Power:     Speed:      Time:            Pre-procedure:112/92(100)      4.3  4.9   4.0      5400   1057  Sedation Induction: 116/95(104)     4.9  3.4   4.0      5400              1105  NS bolus given    108/64(73)     5.2  2.8   3.9      5400   1110  Interventions: pt received a total of 250 cc bolus for a slight drop in PI and slight increase in flow.  Recovery area:   103/75(86)     5.1  2.7   3.9      5450     Patient tolerated the procedure well. PIs were 2.7-4.9 throughout the case with no power elevations. After adequate sedation was achieved, pulse ox 97 and maintained >92% throughout the remainder of the procedure. MAPs were 73-104.   Patient Disposition: Pt was escorted back to 2H08 and report was given to bedside nurse.   Tanda Rockers RN, BSN VAD Coordinator 24/7 Pager [phone]

## 2017-05-08 NOTE — Progress Notes (Signed)
Patient ID: David [name], male   DOB: Mar 04, 1966, 52 y.o.   MRN: [id number]   Advanced Heart Failure VAD Team Note  Subjective:    David [name] is a 52 y.o. male with a PMH of ICM ('09 PCI to LAD, mRCA, '10 PCI to Lcx and '12 mRCA), HFrEF (EF ~20% for >5 years), HLD and HTN admitted 12/26 for VAD placement due to end-stage systolic HF.    - Underwent successful HM-3 LVAD placement 04/20/17. 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  - Developed AKI. CVVHD started 12/30 - ReIntubated 04/23/17  - Developed progressive shock with uptitration of norepinephrine and epinephrine and addition of vasopressin.  Milrinone stopped and dobutamine begun.  CXR with CHF.   - Re-extubated 04/26/2017 - Started on Meropenem 04/27/16 with + Achromobacter in tracheal aspirate. (Finished 05/07/17) - CVVHD stopped 1/10 - 1st iHD 1/12  Coox 84.8% this am on dobutamine 1.5 mg/kg/min. Weight stable. 175 cc of UOP recorded.   Pt had increase serosanguinous drainage from driveline 07/08/15. Heparin held. Plan for tunneled HD cath today, 05/08/17 via IR.   Pt states at rest he continues to improve. Continues to have lots of chest soreness with ambulation and movement.  Had continued bleeding from driveline overnight. Has had to be dressed 5 times in past 24 hours.  TCTS to place stitch this afternoon.   LDH 355 => 319 => 295 => 315 INR 1.6 => 1.8 => 1.95 => 1.43  Echo 12/31 reviewed: No significant pericardial effusion, IV septum midline to slightly to the right, RV function adequate.   LVAD INTERROGATION:  HeartMate III LVAD:  Flow 4.8 liters/min, speed 5400, power 4.0, PI 3.9. 11 PI events.   Objective:    Vital Signs:   Temp:  [97.9 F (36.6 C)-98.7 F (37.1 C)] 98.5 F (36.9 C) (01/14 0300) Pulse Rate:  [49-118] 85 (01/14 0600) Resp:  [10-20] 14 (01/14 0600) BP: (76-107)/(59-96) 93/71 (01/14 0500) SpO2:  [71 %-100 %] 94 % (01/14 0600) Weight:  [167 lb 8.8 oz (76 kg)] 167 lb 8.8 oz (76 kg) (01/14  0500) Last BM Date: 05/05/17 Mean arterial Pressure 70-80s  Intake/Output:   Intake/Output Summary (Last 24 hours) at 05/08/2017 0720 Last data filed at 05/08/2017 0300 Gross per 24 hour  Intake 350.32 ml  Output 175 ml  Net 175.32 ml     Physical Exam   Physical Exam: CVP 18-19 GENERAL: Well appearing this am. NAD.  HEENT: Normal. NECK: Supple, JVP elevated. Carotids OK.  CARDIAC:  Mechanical heart sounds with LVAD hum present.  LUNGS:  CTAB, normal effort.  ABDOMEN:  NT, ND, no HSM. No bruits or masses. +BS  LVAD exit site: Dressing dry soaked with blood. Stabilization device present and accurately applied. Driveline dressing changed daily per sterile technique. EXTREMITIES:  Warm and dry. No cyanosis, clubbing, rash, or edema.  NEUROLOGIC:  Alert & oriented x 3. Cranial nerves grossly intact. Moves all 4 extremities w/o difficulty. Affect pleasant     Telemetry   NSR 80-90s, personally reviewed.   Labs   Basic Metabolic Panel: Recent Labs  Lab 05/04/17 0402  05/05/17 0344 05/05/17 1653 05/06/17 0236 05/06/17 1732 05/07/17 0456 05/07/17 1530 05/08/17 0530  NA 131*   < > 128* 126* 122* 132* 129* 130* 130*  K 3.9   < > 4.6 4.7 4.6 3.8 4.1 4.6 4.3  CL 100*   < > 96* 94* 94* 97* 96* 96* 94*  CO2 26   < >  25 24 21* 25 23 24 25   GLUCOSE 83   < > 89 102* 83 80 86 92 92  BUN 7   < > 15 22* 25* 11 16 19  21*  CREATININE 1.77*   < > 3.32* 4.09* 4.63* 2.84* 3.81* 4.36* 4.97*  CALCIUM 7.6*   < > 8.3* 8.2* 8.1* 8.1* 8.1* 8.3* 8.6*  MG 2.1  --  2.3  --  2.3  --  1.9  --  2.2  PHOS 2.5   < > 4.5 5.4* 5.8* 3.9  --  5.1* 5.7*   < > = values in this interval not displayed.    Liver Function Tests: Recent Labs  Lab 05/02/17 0335  05/06/17 0236 05/06/17 1732 05/07/17 0456 05/07/17 1530 05/08/17 0530  AST 92*  --   --   --  45*  --   --   ALT 66*  --   --   --  32  --   --   ALKPHOS 458*  --   --   --  261*  --   --   BILITOT 5.2*  --   --   --  3.2*  --   --   PROT  6.4*  --   --   --  7.0  --   --   ALBUMIN 1.6*   < > 1.7* 1.6* 1.7* 1.7* 1.8*   < > = values in this interval not displayed.   No results for input(s): LIPASE, AMYLASE in the last 168 hours. No results for input(s): AMMONIA in the last 168 hours.  CBC: Recent Labs  Lab 05/05/17 0344 05/06/17 0236 05/07/17 0456 05/07/17 1530 05/08/17 0530  WBC 19.7* 15.2* 13.4* 12.7* 10.9*  HGB 8.1* 8.0* 7.5* 7.7* 8.9*  HCT 25.7* 24.9* 23.3* 24.4* 27.7*  MCV 82.4 82.5 82.6 83.3 84.7  PLT 364 376 358 377 360    INR: Recent Labs  Lab 05/05/17 0344 05/06/17 0236 05/07/17 0456 05/07/17 1530 05/08/17 0530  INR 1.60 1.82 1.95 1.68 1.43    Other results:  Imaging   Dg Chest Port 1 View  Result Date: 05/07/2017 CLINICAL DATA:  LVAD EXAM: PORTABLE CHEST 1 VIEW COMPARISON:  05/05/2017 FINDINGS: Support devices are stable. Cardiomegaly. Interstitial prominence may reflect interstitial edema. No pneumothorax. No visible significant effusions. IMPRESSION: Suspect mild interstitial edema.  No change. Electronically Signed   By: Rolm Baptise M.D.   On: 05/07/2017 07:18     Medications:     Scheduled Medications: . aspirin EC  81 mg Oral Daily  . atorvastatin  40 mg Oral q1800  . chlorhexidine  15 mL Mouth Rinse BID  . Chlorhexidine Gluconate Cloth  6 each Topical Daily  . citalopram  10 mg Oral Daily  . feeding supplement (ENSURE ENLIVE)  237 mL Oral TID BM  . ferrous [id number] C-folic acid  1 capsule Oral BID BM  . gabapentin  100 mg Oral BID  . mouth rinse  15 mL Mouth Rinse q12n4p  . pantoprazole  40 mg Oral Daily    Infusions: . sodium chloride Stopped (04/30/17 0800)  . sodium chloride    . sodium chloride    . DOBUTamine 1.5 mcg/kg/min (05/07/17 2200)  . sodium chloride      PRN Medications: levalbuterol, ondansetron (ZOFRAN) IV, oxyCODONE, simethicone, sodium chloride, sodium chloride flush, sodium chloride flush   Patient Profile   David [name] is a 52  y.o. male with a PMH of ICM ('09 PCI to  LAD, mRCA, '10 PCI to Lcx and '12 mRCA), HFrEF (EF ~20% for >5 years) s/p HM-III VAD placement on 12/27 due to end-stage systolic HF.   Assessment/Plan:    1. Acute on chronic systolic HF: Echo 50/38/88 with LVEF 20-25%, Mild MR, Mild/Mod MR, Severe LAE, Mildly reduced RV, PA peak pressure 56 mm Hg.  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 Extubated, initially stopped epinephrine/norepinephrine but restarted with fall in UOP and MAP.  With progressive renal failure, pulmonary edema, and hypotension, he was re-intubated on 12/30.   CVVH was begun.  12/31 was difficult day with refractory shock (MAP in 60s).  Reviewed 12/31 echo which showed no significant pericardial effusion, adequate RV function, and IV septum near mid-line.  Off NE/Epi.  - Coox 84.8% this am on dobutamine 1.5 mg/kg/min. Will stop dobutamine today and follow.  - Still with minimal UOP (175 cc) - CVP 18-19. Likely will need HD today. Will discuss diuretic regimen with MD.  - Continue aspirin 81 mg daily.  - Warfarin goal INR 2-2.5. INR 1.43 today. Heparin on hold with driveline site bleeding. Plan for tunneled cath today.  2. CAD s/p PCI to mid RCA and PLOM with DES x 2 and DES to mid LAD in 1/18.   - Continue ASA and statin. No change.    3. PAF: Remains in NSR. Personally reviewed.  4. AKI on CKD stage III: Suspect some degree of intrinsic renal disease prior to LVAD placement (baseline creatinine around 1.5) with probable ATN from intra-op/peri-op hypotension and development of vasodilatory/septic shock.  CVVHD stopped 05/04/2017. Minimal UOP still.   - First iHD 05/06/17 without problems. Will need tunneled catheter hopefully today.  5. Anemia: s/p 1 u PRBCs 12/30, 04/25/17, 1/8.  - Hgb 8.9 this am. (Appropriate rise after 1PRBCs yesterday with bleeding from driveline.  6. ID: Started on cefepime empirically with septic/vasodilatory shock, PCT was 31  04/25/17.  ABX broadened to Meropenem 04/27/16 with + Achromobacter on tracheal aspirate.  - Finished 05/07/17 (10 days)  PCT 8.31 04/27/17 -> 3.87 ->0.94 - WBC lower at 10.9. 7. Elevated LFTs due to shock liver - LFTs trending down.  8. Hyponatremia: Better after HD.  9. Driveline bleeding - Continued overnight.  - Hold heparin. Use SCDs.  - Discussed personally with Dr. Cyndia Bent. To place stitch later today.  - No AC until bleeding controlled.   Length of Stay: [address]  Annamaria Helling 05/08/2017, 7:20 AM  VAD Team --- VAD ISSUES ONLY--- Pager [phone] (7am - 7am)  Advanced Heart Failure Team  Pager [phone] (M-F; 7a - 4p)  Please contact [facility] for night-coverage after hours (4p -7a ) and weekends on [email or web address]  Patient seen and examined with the above-signed Advanced Practice Provider and/or Housestaff. I personally reviewed laboratory data, imaging studies and relevant notes. I independently examined the patient and formulated the important aspects of the plan. I have edited the note to reflect any of my changes or salient points. I have personally discussed the plan with the patient and/or family.  Remains quite tenuous. Still with minimal urine output. Will give lasix 160 IV today and metolazone 5mg . Discussed with Renal at bedside. Will place tunneled cath with IR today followed by iHD this afternoon. Chance of Renal recovery appearing slimmer.   Co-ox ok on dobutamine 1.5. Will plan wean later today. VAD interrogated personally. Parameters stable.  Continue to ooze from driveline site. Will place thrombi-pad. D/w Dr. Cyndia Bent who  will place stitch later today. INR 1.43 today. Holding heparin for now. Resume when able.   Can go to [facility] once tunneled catheter placed and bleeding stopped.   Glori Bickers, MD  10:30 AM

## 2017-05-08 NOTE — Progress Notes (Signed)
Called by HD RN, informed that there was an emergent case that needed to be done before coming to [facility] for HD on David [name]. Stated it would be closer to morning. Will continue to closely monitor pt.  Sherlie Ban, RN

## 2017-05-08 NOTE — Sedation Documentation (Signed)
Patient is resting comfortably. 

## 2017-05-08 NOTE — Sedation Documentation (Signed)
LVAD RN at bedside for procedure.

## 2017-05-08 NOTE — Progress Notes (Signed)
LVAD Coordinator Rounding Note:  Admitted12/26due to decompensated heart failure.  Heartmate IIILVAD [facility].AV valve oversewn due to aortic insufficiency.  Vital signs: Tmax: 98.5 HR:86 Doppler Pressure: 86 Automatic cuff:  106/90 (97) O2 Sat:95 % on 4LNC Wt:165>164>156>160>160>154>158>156>157>160>164>167lbs  LVAD interrogation reveals:  Speed:5300 Flow: 4.3 Power:4.0 w PI:4.9 Alarms:none Events:none Hematocrit:28 Fixed speed:5300 Low speed limit:5000  Labs:  LDH trend:205>407>576>923>967>612>528>492>467>489>441>434>405>376>355>315  INR trend:1.34>1.52>1.73>1.95>2.32>3.03>3.80>3.16>2.39>2.23>1.70>1.65>1.91>1.60>1.43  Anticoagulation Plan: -INR Goal:2-2.5  -ASA Dose: 81 mg   Blood Products: Intra op:2 units/FFP 04/20/17  Post op: PRBC's 1 unit 12/29 1 unit 12/30 1 unit 04/25/17 1 unit 05/02/17 1 unit 05/08/17  Device:[employer]tr[employer] BiV -Therapies:off  Arrhythmia: 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 (on amiodarone and Eliquis for this) - Afib 12/29 - 04/28/17 - NSR 1/5 - present  Nitric Oxide: -  stopped on 12/30 due to initiation of BiPap  Respiratory: - Extubated 12/28 - Reintubated 04/23/2017 - Extubated 04/26/2017  Renal:  04/23/17 - CRRT started for AKI - stopped 05/05/17  Infection: - 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 VAD drive line continues to bleed. Dr. Cyndia Bent will address this afternoon. VAD coordinator will pick up supplies from the OR today to address this issue. 2. VAD coordinator will accompany pt to IR today. 3. Pt will receive another unit of blood today. 4. Please call VAD Pager for any equipment concerns or drive line dressing  questions/concerns.   Tanda Rockers RN, VAD Coordinator 24/7 pager [phone]

## 2017-05-08 NOTE — Progress Notes (Signed)
Exit site care:  Drive line exit wound care. Existing VAD dressing removed and site care performed using sterile technique.Gauze and silver is saturated with blood. Dr. Cyndia Bent in room to stitch drive line site and apply surgicel. After stitches and clean out performed by Dr. Cyndia Bent, drive line exit site cleaned with Chlora prep applicators x 2, allowed to dry, and gauze dressing with silver strip re-applied. Exit site healing and unincorporated, the velour is fully implanted at exit site. No redness, tenderness, foul odor or rash noted.  Tanda Rockers RN, BSN VAD Coordinator 24/7 Pager [phone]

## 2017-05-08 NOTE — Progress Notes (Signed)
PT Cancellation Note  Patient Details Name: David [name] MRN: [id number] DOB: 05-19-1965   Cancelled Treatment:    Reason Eval/Treat Not Completed: Patient at procedure or test/unavailable. Pt undergoing procedure in room.   [name] 05/08/2017, 2:10 PM [employer] PT [phone]

## 2017-05-08 NOTE — Progress Notes (Signed)
[facility] Progress Note    Assessment/ Plan:   1.  AKI on CKD III: likely due to ATN from shock s/p LVAD placement with underlying arterionephrosclerosis.  Required CRRT initially and now has been transitioned to IHD (1/11).  For IHD again later today- orders have been written.  We are attempting high-dose Lasix/ metolazone but not sure if it will make a big impact yet as not a lot of signs of recovery.  Getting tunneled HD cath today with IR.  No heparin with HD given drive line bleeding.  2.  Acute on chronic systolic CHF: s/p Heartmate III 12/27.  Advanced HF and cardiothoracic surgery following.    3.  Elevated LFTs: shock liver, slowly getting better  4.  Hyponatremia: vol related, expect to improve with HD.  5.  Achromobacter pneumonia: s/p antibiotics (ended 1/10)  Subjective:    Tolerated IHD 05/05/17.  Drive line bleeding, dressing changed 5x over 24 hrs.  Getting high-dose lasix/ metolazone today, CVP 19, will likely need HD.   Objective:   BP 115/68   Pulse 87   Temp 98.6 F (37 C) (Oral)   Resp 16   Ht 5\' 5"  (1.651 m)   Wt 76 kg (167 lb 8.8 oz)   SpO2 95%   BMI 27.88 kg/m   Intake/Output Summary (Last 24 hours) at 05/08/2017 0944 Last data filed at 05/08/2017 0809 Gross per 24 hour  Intake 426.52 ml  Output 175 ml  Net 251.52 ml   Weight change: -2 kg (-6.6 oz)  Physical Exam: [id number] gentleman, NAD, lying in bed HEENT: + JVD CVS: mechanical hum of LVAD Resp: mildly increased WOB Abd: + drive line LUQ with sanguinous leaking Ext:1+ LE edema  Imaging: Dg Chest Port 1 View  Result Date: 05/07/2017 CLINICAL DATA:  LVAD EXAM: PORTABLE CHEST 1 VIEW COMPARISON:  05/05/2017 FINDINGS: Support devices are stable. Cardiomegaly. Interstitial prominence may reflect interstitial edema. No pneumothorax. No visible significant effusions. IMPRESSION: Suspect mild interstitial edema.  No change. Electronically Signed   By: Rolm Baptise M.D.   On:  05/07/2017 07:18    Labs: BMET Recent Labs  Lab 05/04/17 1659 05/05/17 0344 05/05/17 1653 05/06/17 0236 05/06/17 1732 05/07/17 0456 05/07/17 1530 05/08/17 0530  NA 129* 128* 126* 122* 132* 129* 130* 130*  K 4.5 4.6 4.7 4.6 3.8 4.1 4.6 4.3  CL 97* 96* 94* 94* 97* 96* 96* 94*  CO2 26 25 24  21* 25 23 24 25   GLUCOSE 92 89 102* 83 80 86 92 92  BUN 11 15 22* 25* 11 16 19  21*  CREATININE 2.52* 3.32* 4.09* 4.63* 2.84* 3.81* 4.36* 4.97*  CALCIUM 7.8* 8.3* 8.2* 8.1* 8.1* 8.1* 8.3* 8.6*  PHOS 3.4 4.5 5.4* 5.8* 3.9  --  5.1* 5.7*   CBC Recent Labs  Lab 05/06/17 0236 05/07/17 0456 05/07/17 1530 05/08/17 0530  WBC 15.2* 13.4* 12.7* 10.9*  HGB 8.0* 7.5* 7.7* 8.9*  HCT 24.9* 23.3* 24.4* 27.7*  MCV 82.5 82.6 83.3 84.7  PLT 376 358 377 360    Medications:    . aspirin EC  81 mg Oral Daily  . atorvastatin  40 mg Oral q1800  . chlorhexidine  15 mL Mouth Rinse BID  . Chlorhexidine Gluconate Cloth  6 each Topical Daily  . citalopram  10 mg Oral Daily  . feeding supplement (ENSURE ENLIVE)  237 mL Oral TID BM  . ferrous [id number] C-folic acid  1 capsule Oral BID BM  . gabapentin  100 mg Oral BID  . mouth rinse  15 mL Mouth Rinse q12n4p  . pantoprazole  40 mg Oral Daily  . THROMBI-PAD  1 each Topical Once      Madelon Lips, MD [facility] pgr [phone] 05/08/2017, 9:44 AM

## 2017-05-08 NOTE — Progress Notes (Signed)
Patient ID: Samual Beals, male   DOB: 1965/10/20, 52 y.o.   MRN: [id number] HeartMate 3 Rounding Note  Subjective:    Had continuous bleeding around the drive line exit site over the weekend.  Remains on dobutamine 1.5 mcg. Co-ox 84 this am, repeat 57 CVP 15  Had tunneled catheter today.  Says he felt like he had to urinate all day but couldn't. Bladder scan this pm showed 540 cc.   LVAD INTERROGATION:  HeartMate IIl LVAD:  Flow 5 liters/min, speed 5400, power 4, PI 3    Objective:    Vital Signs:   Temp:  [97.6 F (36.4 C)-98.7 F (37.1 C)] 97.8 F (36.6 C) (01/14 1600) Pulse Rate:  [49-118] 90 (01/14 1600) Resp:  [9-21] 12 (01/14 1300) BP: (76-116)/(59-96) 85/71 (01/14 1400) SpO2:  [87 %-100 %] 91 % (01/14 1600) Weight:  [76 kg (167 lb 8.8 oz)] 76 kg (167 lb 8.8 oz) (01/14 0500) Last BM Date: 05/05/17 Mean arterial Pressure 70-80's  Intake/Output:   Intake/Output Summary (Last 24 hours) at 05/08/2017 1746 Last data filed at 05/08/2017 1600 Gross per 24 hour  Intake 877.1 ml  Output 175 ml  Net 702.1 ml     Physical Exam: General:  Chronically ill-appearing but awake and alert.   No resp difficulty Neck: left IJ central line Cor: intermittent heart sounds with LVAD hum present. Lungs: clear Abdomen: soft, nontender, nondistended. Good bowel sounds. Extremities: minimal edema in arms Neuro: alert & orientedx3,  moves all 4 extremities w/o difficulty. Affect pleasant Drive line site was oozing from just inside the tunnel today when dressing changed. I put a horizontal mattress suture around it to tamponade it and applied some surgicel. It looked like it stopped. The site is slow healing. The sternotomy and abdominal incision are healing well.  Telemetry: sinus rhythm 90's  Labs: Basic Metabolic Panel: Recent Labs  Lab 05/04/17 0402  05/05/17 0344  05/06/17 0236 05/06/17 1732 05/07/17 0456 05/07/17 1530 05/08/17 0530 05/08/17 1552  NA 131*   < > 128*    < > 122* 132* 129* 130* 130* 129*  K 3.9   < > 4.6   < > 4.6 3.8 4.1 4.6 4.3 4.2  CL 100*   < > 96*   < > 94* 97* 96* 96* 94* 94*  CO2 26   < > 25   < > 21* 25 23 24 25 23   GLUCOSE 83   < > 89   < > 83 80 86 92 92 109*  BUN 7   < > 15   < > 25* 11 16 19  21* 26*  CREATININE 1.77*   < > 3.32*   < > 4.63* 2.84* 3.81* 4.36* 4.97* 5.40*  CALCIUM 7.6*   < > 8.3*   < > 8.1* 8.1* 8.1* 8.3* 8.6* 8.3*  MG 2.1  --  2.3  --  2.3  --  1.9  --  2.2  --   PHOS 2.5   < > 4.5   < > 5.8* 3.9  --  5.1* 5.7* 6.1*   < > = values in this interval not displayed.    Liver Function Tests: Recent Labs  Lab 05/02/17 0335  05/06/17 1732 05/07/17 0456 05/07/17 1530 05/08/17 0530 05/08/17 1552  AST 92*  --   --  45*  --   --   --   ALT 66*  --   --  32  --   --   --  ALKPHOS 458*  --   --  261*  --   --   --   BILITOT 5.2*  --   --  3.2*  --   --   --   PROT 6.4*  --   --  7.0  --   --   --   ALBUMIN 1.6*   < > 1.6* 1.7* 1.7* 1.8* 1.7*   < > = values in this interval not displayed.   No results for input(s): LIPASE, AMYLASE in the last 168 hours. No results for input(s): AMMONIA in the last 168 hours.  CBC: Recent Labs  Lab 05/05/17 0344 05/06/17 0236 05/07/17 0456 05/07/17 1530 05/08/17 0530  WBC 19.7* 15.2* 13.4* 12.7* 10.9*  HGB 8.1* 8.0* 7.5* 7.7* 8.9*  HCT 25.7* 24.9* 23.3* 24.4* 27.7*  MCV 82.4 82.5 82.6 83.3 84.7  PLT 364 376 358 377 360    INR: Recent Labs  Lab 05/05/17 0344 05/06/17 0236 05/07/17 0456 05/07/17 1530 05/08/17 0530  INR 1.60 1.82 1.95 1.68 1.43    Other results:  EKG:   Imaging: Ir Fluoro Guide Cv Line Right  Result Date: 05/08/2017 CLINICAL DATA:  Heart failure with LVAD. Renal insufficiency. Indwelling right subclavian temporary hemodialysis catheter. Needs long-term access for hemodialysis. EXAM: TUNNELED HEMODIALYSIS CATHETER PLACEMENT WITH ULTRASOUND AND FLUOROSCOPIC GUIDANCE TECHNIQUE: The procedure, risks, benefits, and alternatives were explained to  the patient. Questions regarding the procedure were encouraged and answered. The patient understands and consents to the procedure. As antibiotic prophylaxis, cefazolin 2 g was ordered pre-procedure and administered intravenously within one hour of incision.Patency of the right IJ vein was confirmed with ultrasound with image documentation. An appropriate skin site was determined. Region was prepped using maximum barrier technique including cap and mask, sterile gown, sterile gloves, large sterile sheet, and Chlorhexidine as cutaneous antisepsis. The region was infiltrated locally with 1% lidocaine. Intravenous Fentanyl and Versed were administered as conscious sedation during continuous monitoring of the patient's level of consciousness and physiological / cardiorespiratory status by the radiology RN, with a total moderate sedation time of 10 minutes. Under real-time ultrasound guidance, the right IJ vein was accessed with a 21 gauge micropuncture needle; the needle tip within the vein was confirmed with ultrasound image documentation. Needle exchanged over the 018 guidewire for transitional dilator, which allowed advancement of a Benson wire into the IVC. Over this, an MPA catheter was advanced. A Palindrome 23 hemodialysis catheter was tunneled from the right anterior chest wall approach to the right IJ dermatotomy site. The MPA catheter was exchanged over an Amplatz wire for serial vascular dilators which allow placement of a peel-away sheath, through which the catheter was advanced under intermittent fluoroscopy, positioned with its tips in the proximal and midright atrium. Spot chest radiograph confirms good catheter position. No pneumothorax. Catheter was flushed and primed per protocol. Catheter secured externally with O Prolene sutures. The right IJ dermatotomy site was closed with Dermabond. COMPLICATIONS: COMPLICATIONS None immediate FLUOROSCOPY TIME:  30 seconds; 4 mGy COMPARISON:  None IMPRESSION: 1.  Technically successful placement of tunneled right IJ hemodialysis catheter with ultrasound and fluoroscopic guidance. Ready for routine use. ACCESS: Remains approachable for percutaneous intervention as needed. Electronically Signed   By: Lucrezia Europe M.D.   On: 05/08/2017 11:28   Ir US Guide Vasc Access Right  Result Date: 05/08/2017 CLINICAL DATA:  Heart failure with LVAD. Renal insufficiency. Indwelling right subclavian temporary hemodialysis catheter. Needs long-term access for hemodialysis. EXAM: TUNNELED HEMODIALYSIS CATHETER PLACEMENT WITH ULTRASOUND  AND FLUOROSCOPIC GUIDANCE TECHNIQUE: The procedure, risks, benefits, and alternatives were explained to the patient. Questions regarding the procedure were encouraged and answered. The patient understands and consents to the procedure. As antibiotic prophylaxis, cefazolin 2 g was ordered pre-procedure and administered intravenously within one hour of incision.Patency of the right IJ vein was confirmed with ultrasound with image documentation. An appropriate skin site was determined. Region was prepped using maximum barrier technique including cap and mask, sterile gown, sterile gloves, large sterile sheet, and Chlorhexidine as cutaneous antisepsis. The region was infiltrated locally with 1% lidocaine. Intravenous Fentanyl and Versed were administered as conscious sedation during continuous monitoring of the patient's level of consciousness and physiological / cardiorespiratory status by the radiology RN, with a total moderate sedation time of 10 minutes. Under real-time ultrasound guidance, the right IJ vein was accessed with a 21 gauge micropuncture needle; the needle tip within the vein was confirmed with ultrasound image documentation. Needle exchanged over the 018 guidewire for transitional dilator, which allowed advancement of a Benson wire into the IVC. Over this, an MPA catheter was advanced. A Palindrome 23 hemodialysis catheter was tunneled from the  right anterior chest wall approach to the right IJ dermatotomy site. The MPA catheter was exchanged over an Amplatz wire for serial vascular dilators which allow placement of a peel-away sheath, through which the catheter was advanced under intermittent fluoroscopy, positioned with its tips in the proximal and midright atrium. Spot chest radiograph confirms good catheter position. No pneumothorax. Catheter was flushed and primed per protocol. Catheter secured externally with O Prolene sutures. The right IJ dermatotomy site was closed with Dermabond. COMPLICATIONS: COMPLICATIONS None immediate FLUOROSCOPY TIME:  30 seconds; 4 mGy COMPARISON:  None IMPRESSION: 1. Technically successful placement of tunneled right IJ hemodialysis catheter with ultrasound and fluoroscopic guidance. Ready for routine use. ACCESS: Remains approachable for percutaneous intervention as needed. Electronically Signed   By: Lucrezia Europe M.D.   On: 05/08/2017 11:28   Dg Chest Port 1 View  Result Date: 05/07/2017 CLINICAL DATA:  LVAD EXAM: PORTABLE CHEST 1 VIEW COMPARISON:  05/05/2017 FINDINGS: Support devices are stable. Cardiomegaly. Interstitial prominence may reflect interstitial edema. No pneumothorax. No visible significant effusions. IMPRESSION: Suspect mild interstitial edema.  No change. Electronically Signed   By: Rolm Baptise M.D.   On: 05/07/2017 07:18     Medications:     Scheduled Medications: . aspirin EC  81 mg Oral Daily  . atorvastatin  40 mg Oral q1800  . chlorhexidine  15 mL Mouth Rinse BID  . Chlorhexidine Gluconate Cloth  6 each Topical Daily  . citalopram  10 mg Oral Daily  . feeding supplement (ENSURE ENLIVE)  237 mL Oral TID BM  . ferrous [id number] C-folic acid  1 capsule Oral BID BM  . gabapentin  100 mg Oral BID  . lidocaine (PF)      . mouth rinse  15 mL Mouth Rinse q12n4p  . pantoprazole  40 mg Oral Daily  . Warfarin - Pharmacist Dosing Inpatient   Does not apply q1800     Infusions: . sodium chloride Stopped (04/30/17 0800)  . DOBUTamine 1.5 mcg/kg/min (05/08/17 1600)    PRN Medications: levalbuterol, ondansetron (ZOFRAN) IV, oxyCODONE, simethicone, sodium chloride flush, sodium chloride flush   Assessment:   1. Ischemic cardiomyopathy s/p multiple PCI's with chronic systolic heart failure and EF 20% on home dobutamine preop with significant improvement in symptoms. Evaluated at [facility] for heart transplant but LVAD felt to be the best treatment  for him at this time. POD19s/p HeartMate III LVAD. He is critically ill with multi-system organ failure but improving.Hemodynamics stable on dobut 1.5. Slowly weaning off 2.H/OHypertension 3.H/OHyperlipidemia 4. PAF in sinuspreopon amiodarone and Eliquis.Now back in sinus off amiodarone.  5.H/OStage III CKD. Acute on chronic postop renal failureon intermittent HD. He received some lasix and has 540 cc in bladder tonight. Still hopeful for recovery.  6. LBBB s/p CRT-D upgrade in 10/2016 without improvement in symptoms or EF. LV epicardial leads cut off at the time of surgery. 7. Prior smoking with mild obstruction and severe diffusion defect on PFT's. CXRhas been stable. Continue IS. 8. Postop leukocytosis and fever. WBC down to 10.9.  9. Marked rise in bilirubin and transaminases. Likely due to pump runand right sided congestion, hypotension. 10. INR is1.60 today, down from 1.9 yesterday probably due to Ensure. Coumadin per pharmacy. Start heparin drip and hold Coumadin if getting a tunneled HD catheter    11. Severe malnutrition with albumin 1.7. Taking PO fairly well. Continue to encourage nutrition.    12. PT    13. Anemia: Hgb improved after transfusion last night.   Plan/Discussion:    Overall he has been stable. MAP in good range off pressors. Slow dobutamine wean.  VAD flow has been great.  Intermittent HD. Bleeding around the exit site is due to raw tissue that is slow to heal. Stopped  with a suture. Will resume coumadin tonight and heparin in the am if no bleeding.  Continue to mobilize and encourage nutrition.  I reviewed the LVAD parameters from today, and compared the results to the patient's prior recorded data.  No programming changes were made.  The LVAD is functioning within specified parameters.   LVAD interrogation was negative for any significant power changes, alarms or PI events/speed drops.  LVAD equipment check completed and is in good working order.  Back-up equipment present.   LVAD education done on emergency procedures and precautions and reviewed exit site care.  Length of Stay: [address]  Fernande Boyden [facility] 05/08/2017, 5:46 PM

## 2017-05-08 NOTE — Sedation Documentation (Signed)
Patient denies pain and is resting comfortably.  

## 2017-05-09 LAB — RENAL FUNCTION PANEL
ALBUMIN: 1.6 g/dL — AB (ref 3.5–5.0)
ANION GAP: 11 (ref 5–15)
ANION GAP: 10 (ref 5–15)
Albumin: 1.7 g/dL — ABNORMAL LOW (ref 3.5–5.0)
BUN: 29 mg/dL — AB (ref 6–20)
BUN: 10 mg/dL (ref 6–20)
CO2: 24 mmol/L (ref 22–32)
CO2: 26 mmol/L (ref 22–32)
Calcium: 8.4 mg/dL — ABNORMAL LOW (ref 8.9–10.3)
Calcium: 7.9 mg/dL — ABNORMAL LOW (ref 8.9–10.3)
Chloride: 95 mmol/L — ABNORMAL LOW (ref 101–111)
Chloride: 95 mmol/L — ABNORMAL LOW (ref 101–111)
Creatinine, Ser: 5.73 mg/dL — ABNORMAL HIGH (ref 0.61–1.24)
Creatinine, Ser: 3.22 mg/dL — ABNORMAL HIGH (ref 0.61–1.24)
GFR calc Af Amer: 12 mL/min — ABNORMAL LOW (ref >60)
GFR calc Af Amer: 24 mL/min — ABNORMAL LOW (ref >60)
GFR calc non Af Amer: 10 mL/min — ABNORMAL LOW (ref >60)
GFR, EST NON AFRICAN AMERICAN: 20 mL/min — AB (ref >60)
GLUCOSE: 129 mg/dL — AB (ref 65–99)
Glucose, Bld: 131 mg/dL — ABNORMAL HIGH (ref 65–99)
PHOSPHORUS: 3.2 mg/dL (ref 2.5–4.6)
POTASSIUM: 4.3 mmol/L (ref 3.5–5.1)
Phosphorus: 6.5 mg/dL — ABNORMAL HIGH (ref 2.5–4.6)
Potassium: 3.5 mmol/L (ref 3.5–5.1)
SODIUM: 130 mmol/L — AB (ref 135–145)
Sodium: 131 mmol/L — ABNORMAL LOW (ref 135–145)

## 2017-05-09 LAB — COOXEMETRY PANEL
CARBOXYHEMOGLOBIN: 2.3 % — AB (ref 0.5–1.5)
Methemoglobin: 0.9 % (ref 0.0–1.5)
O2 SAT: 68.6 %
TOTAL HEMOGLOBIN: 7.8 g/dL — AB (ref 12.0–16.0)

## 2017-05-09 LAB — CBC
HEMATOCRIT: 24.9 % — AB (ref 39.0–52.0)
HEMOGLOBIN: 7.8 g/dL — AB (ref 13.0–17.0)
MCH: 26.2 pg (ref 26.0–34.0)
MCHC: 31.3 g/dL (ref 30.0–36.0)
MCV: 83.6 fL (ref 78.0–100.0)
Platelets: 324 10*3/uL (ref 150–400)
RBC: 2.98 MIL/uL — AB (ref 4.22–5.81)
RDW: 19.9 % — ABNORMAL HIGH (ref 11.5–15.5)
WBC: 10.6 10*3/uL — AB (ref 4.0–10.5)

## 2017-05-09 LAB — LACTATE DEHYDROGENASE: LDH: 265 U/L — AB (ref 98–192)

## 2017-05-09 LAB — PROTIME-INR
INR: 1.49
PROTHROMBIN TIME: 17.9 s — AB (ref 11.4–15.2)

## 2017-05-09 LAB — MAGNESIUM: Magnesium: 2.1 mg/dL (ref 1.7–2.4)

## 2017-05-09 MED ORDER — METOLAZONE 5 MG PO TABS
5.0000 mg | ORAL_TABLET | Freq: Once | ORAL | Status: AC
  Administered 2017-05-10: 5 mg via ORAL
  Filled 2017-05-09 (×2): qty 1

## 2017-05-09 MED ORDER — HEPARIN (PORCINE) IN NACL 100-0.45 UNIT/ML-% IJ SOLN
1450.0000 [IU]/h | INTRAMUSCULAR | Status: DC
Start: 2017-05-09 — End: 2017-05-11
  Administered 2017-05-09: 1250 [IU]/h via INTRAVENOUS
  Administered 2017-05-10 (×2): 1450 [IU]/h via INTRAVENOUS
  Filled 2017-05-09 (×3): qty 250

## 2017-05-09 MED ORDER — WARFARIN SODIUM 2 MG PO TABS
2.0000 mg | ORAL_TABLET | Freq: Once | ORAL | Status: AC
  Administered 2017-05-09: 2 mg via ORAL
  Filled 2017-05-09: qty 1

## 2017-05-09 MED ORDER — FUROSEMIDE 10 MG/ML IJ SOLN
160.0000 mg | Freq: Once | INTRAVENOUS | Status: AC
  Administered 2017-05-10: 160 mg via INTRAVENOUS
  Filled 2017-05-09: qty 4

## 2017-05-09 NOTE — Progress Notes (Signed)
Ducor for Coumadin/Heparin- resume Indication: LVAD  Allergies  Allergen Reactions  . Plavix [Clopidogrel Bisulfate] Hives   Patient Measurements: Height: 5\' 5"  (165.1 cm) Weight: 167 lb 15.9 oz (76.2 kg) IBW/kg (Calculated) : 61.5  Vital Signs: Temp: 98 F (36.7 C) (01/15 1131) Temp Source: Oral (01/15 1131) BP: 81/70 (01/15 1400) Pulse Rate: 86 (01/15 1400)  Labs: Recent Labs    05/06/17 2144  05/07/17 0456 05/07/17 1530 05/08/17 0530 05/08/17 1552 05/09/17 0224 05/09/17 0350  HGB  --    < > 7.5* 7.7* 8.9*  --   --  7.8*  HCT  --    < > 23.3* 24.4* 27.7*  --   --  24.9*  PLT  --    < > 358 377 360  --   --  324  LABPROT  --    < > 22.1* 19.6* 17.3*  --  17.9*  --   INR  --    < > 1.95 1.68 1.43  --  1.49  --   HEPARINUNFRC 0.37  --  0.36  --   --   --   --   --   CREATININE  --   --  3.81* 4.36* 4.97* 5.40* 5.73*  --    < > = values in this interval not displayed.   Estimated Creatinine Clearance: 14.2 mL/min (A) (by C-G formula based on SCr of 5.73 mg/dL (H)).  Assessment: 52 yo male s/p LVAD implantation 12/27.   IV heparin and Coumadin on hold for tunneled catheter yesterday.  Also with bleeding from driveline site.  Coumadin restarted last night, heparin remains on hold.  Driveline site bleeding improved today, did not require dressing change overnight.    PM f/u - spoke to Drs. Bartle and Bensimhon, okay to resume IV heparin this afternoon.  Goal of Therapy:  Heparin 0.3-0.7 units/ml INR 2-2.5 Monitor platelets by anticoagulation protocol: Yes    Plan: Coumadin 2 mg po again tonight. Daily PT/INR. Resume IV heparin at rate of 1250 units/hr. Check heparin level in 8 hrs. Daily heparin level and CBC.  [name], BCPS  Clinical Pharmacist Pager [phone]  05/09/2017 3:30 PM

## 2017-05-09 NOTE — Progress Notes (Addendum)
Patient ID: Ebb Carelock, male   DOB: [birth year], 52 y.o.   MRN: [id number]   Advanced Heart Failure VAD Team Note  Subjective:    Yurem Viner is a 52 y.o. male with a PMH of ICM ('09 PCI to LAD, mRCA, '10 PCI to Lcx and '12 mRCA), HFrEF (EF ~20% for >5 years), HLD and HTN admitted 12/26 for VAD placement due to end-stage systolic HF.    - Underwent successful HM-3 LVAD placement 04/20/17. 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  - Developed AKI. CVVHD started 12/30 - ReIntubated 04/23/17  - Developed progressive shock with uptitration of norepinephrine and epinephrine and addition of vasopressin.  Milrinone stopped and dobutamine begun.  CXR with CHF.   - Re-extubated 04/26/2017 - Started on Meropenem 04/27/16 with + Achromobacter in tracheal aspirate. (Finished 05/07/17) - CVVHD stopped 1/10 - 1st iHD 1/12 - Stitch placed in bleeding driveline from over weekend 05/08/17 - Tunneled HD cath placed 05/08/17  Coox 68.6% this am on dobutamine 1.5 mcg/kg/min. Weight up 2 lbs.   On iHD currently. Feeling OK. No further bleeding from driveline after stitch placed by Dr. Cyndia Bent. 200 cc of UOP last night, and 500 cc in bladder early this am that he has not passed yet. Denies SOB. Pain well controlled. Anxious about moving out of 2H. Currently tolerating HD well.   LDH 355 => 319 => 295 => 315 => 265 INR 1.6 => 1.8 => 1.95 => 1.43 => 1.49  Echo 12/31 reviewed: No significant pericardial effusion, IV septum midline to slightly to the right, RV function adequate.   LVAD INTERROGATION:  HeartMate III LVAD:  Flow 4.8 liters/min, speed 5400, power 4.0, PI 3.6, 6 PI events.    Objective:    Vital Signs:   Temp:  [97.1 F (36.2 C)-98.6 F (37 C)] 97.1 F (36.2 C) (01/15 0555) Pulse Rate:  [69-90] 88 (01/15 0700) Resp:  [9-21] 19 (01/15 0700) BP: (85-126)/(64-96) 126/93 (01/15 0600) SpO2:  [90 %-99 %] 97 % (01/15 0700) FiO2 (%):  [99 %] 99 % (01/15 0555) Weight:  [169 lb 15.6 oz (77.1  kg)-170 lb (77.1 kg)] 169 lb 15.6 oz (77.1 kg) (01/15 0555) Last BM Date: 05/05/17 Mean arterial Pressure 70-80s  Intake/Output:   Intake/Output Summary (Last 24 hours) at 05/09/2017 0723 Last data filed at 05/09/2017 0500 Gross per 24 hour  Intake 571.9 ml  Output 425 ml  Net 146.9 ml     Physical Exam   Physical Exam: CVP 15-16 GENERAL: NAD.  HEENT: Normal. NECK: Supple, JVP to jaw Carotids OK.  CARDIAC:  Mechanical heart sounds with LVAD hum present.  R chest tunneled catheter LUNGS:  CTAB, normal effort.  ABDOMEN:  NT, ND, no HSM. No bruits or masses. +BS  LVAD exit site: Dressing dry and intact. No erythema or drainage. Stabilization device present and accurately applied. Driveline dressing changed daily per sterile technique. EXTREMITIES:  Warm and dry. No cyanosis, clubbing, rash, or edema.  NEUROLOGIC:  Alert & oriented x 3. Cranial nerves grossly intact. Moves all 4 extremities w/o difficulty. Affect pleasant     Telemetry   NSR 80-90s, personally reviewed.   Labs   Basic Metabolic Panel: Recent Labs  Lab 05/05/17 0344  05/06/17 0236 05/06/17 1732 05/07/17 0456 05/07/17 1530 05/08/17 0530 05/08/17 1552 05/09/17 0224  NA 128*   < > 122* 132* 129* 130* 130* 129* 130*  K 4.6   < > 4.6 3.8 4.1 4.6 4.3 4.2 4.3  CL 96*   < > 94* 97* 96* 96* 94* 94* 95*  CO2 25   < > 21* 25 23 24 25 23 24   GLUCOSE 89   < > 83 80 86 92 92 109* 129*  BUN 15   < > 25* 11 16 19  21* 26* 29*  CREATININE 3.32*   < > 4.63* 2.84* 3.81* 4.36* 4.97* 5.40* 5.73*  CALCIUM 8.3*   < > 8.1* 8.1* 8.1* 8.3* 8.6* 8.3* 8.4*  MG 2.3  --  2.3  --  1.9  --  2.2  --  2.1  PHOS 4.5   < > 5.8* 3.9  --  5.1* 5.7* 6.1* 6.5*   < > = values in this interval not displayed.    Liver Function Tests: Recent Labs  Lab 05/07/17 0456 05/07/17 1530 05/08/17 0530 05/08/17 1552 05/09/17 0224  AST 45*  --   --   --   --   ALT 32  --   --   --   --   ALKPHOS 261*  --   --   --   --   BILITOT 3.2*  --   --    --   --   PROT 7.0  --   --   --   --   ALBUMIN 1.7* 1.7* 1.8* 1.7* 1.7*   No results for input(s): LIPASE, AMYLASE in the last 168 hours. No results for input(s): AMMONIA in the last 168 hours.  CBC: Recent Labs  Lab 05/06/17 0236 05/07/17 0456 05/07/17 1530 05/08/17 0530 05/09/17 0350  WBC 15.2* 13.4* 12.7* 10.9* 10.6*  HGB 8.0* 7.5* 7.7* 8.9* 7.8*  HCT 24.9* 23.3* 24.4* 27.7* 24.9*  MCV 82.5 82.6 83.3 84.7 83.6  PLT 376 358 377 360 324    INR: Recent Labs  Lab 05/06/17 0236 05/07/17 0456 05/07/17 1530 05/08/17 0530 05/09/17 0224  INR 1.82 1.95 1.68 1.43 1.49    Other results:  Imaging   Ir Fluoro Guide Cv Line Right  Result Date: 05/08/2017 CLINICAL DATA:  Heart failure with LVAD. Renal insufficiency. Indwelling right subclavian temporary hemodialysis catheter. Needs long-term access for hemodialysis. EXAM: TUNNELED HEMODIALYSIS CATHETER PLACEMENT WITH ULTRASOUND AND FLUOROSCOPIC GUIDANCE TECHNIQUE: The procedure, risks, benefits, and alternatives were explained to the patient. Questions regarding the procedure were encouraged and answered. The patient understands and consents to the procedure. As antibiotic prophylaxis, cefazolin 2 g was ordered pre-procedure and administered intravenously within one hour of incision.Patency of the right IJ vein was confirmed with ultrasound with image documentation. An appropriate skin site was determined. Region was prepped using maximum barrier technique including cap and mask, sterile gown, sterile gloves, large sterile sheet, and Chlorhexidine as cutaneous antisepsis. The region was infiltrated locally with 1% lidocaine. Intravenous Fentanyl and Versed were administered as conscious sedation during continuous monitoring of the patient's level of consciousness and physiological / cardiorespiratory status by the radiology RN, with a total moderate sedation time of 10 minutes. Under real-time ultrasound guidance, the right IJ vein was  accessed with a 21 gauge micropuncture needle; the needle tip within the vein was confirmed with ultrasound image documentation. Needle exchanged over the 018 guidewire for transitional dilator, which allowed advancement of a Benson wire into the IVC. Over this, an MPA catheter was advanced. A Palindrome 23 hemodialysis catheter was tunneled from the right anterior chest wall approach to the right IJ dermatotomy site. The MPA catheter was exchanged over an Amplatz wire for serial vascular dilators which allow  placement of a peel-away sheath, through which the catheter was advanced under intermittent fluoroscopy, positioned with its tips in the proximal and midright atrium. Spot chest radiograph confirms good catheter position. No pneumothorax. Catheter was flushed and primed per protocol. Catheter secured externally with O Prolene sutures. The right IJ dermatotomy site was closed with Dermabond. COMPLICATIONS: COMPLICATIONS None immediate FLUOROSCOPY TIME:  30 seconds; 4 mGy COMPARISON:  None IMPRESSION: 1. Technically successful placement of tunneled right IJ hemodialysis catheter with ultrasound and fluoroscopic guidance. Ready for routine use. ACCESS: Remains approachable for percutaneous intervention as needed. Electronically Signed   By: Lucrezia Europe M.D.   On: 05/08/2017 11:28   Ir US Guide Vasc Access Right  Result Date: 05/08/2017 CLINICAL DATA:  Heart failure with LVAD. Renal insufficiency. Indwelling right subclavian temporary hemodialysis catheter. Needs long-term access for hemodialysis. EXAM: TUNNELED HEMODIALYSIS CATHETER PLACEMENT WITH ULTRASOUND AND FLUOROSCOPIC GUIDANCE TECHNIQUE: The procedure, risks, benefits, and alternatives were explained to the patient. Questions regarding the procedure were encouraged and answered. The patient understands and consents to the procedure. As antibiotic prophylaxis, cefazolin 2 g was ordered pre-procedure and administered intravenously within one hour of  incision.Patency of the right IJ vein was confirmed with ultrasound with image documentation. An appropriate skin site was determined. Region was prepped using maximum barrier technique including cap and mask, sterile gown, sterile gloves, large sterile sheet, and Chlorhexidine as cutaneous antisepsis. The region was infiltrated locally with 1% lidocaine. Intravenous Fentanyl and Versed were administered as conscious sedation during continuous monitoring of the patient's level of consciousness and physiological / cardiorespiratory status by the radiology RN, with a total moderate sedation time of 10 minutes. Under real-time ultrasound guidance, the right IJ vein was accessed with a 21 gauge micropuncture needle; the needle tip within the vein was confirmed with ultrasound image documentation. Needle exchanged over the 018 guidewire for transitional dilator, which allowed advancement of a Benson wire into the IVC. Over this, an MPA catheter was advanced. A Palindrome 23 hemodialysis catheter was tunneled from the right anterior chest wall approach to the right IJ dermatotomy site. The MPA catheter was exchanged over an Amplatz wire for serial vascular dilators which allow placement of a peel-away sheath, through which the catheter was advanced under intermittent fluoroscopy, positioned with its tips in the proximal and midright atrium. Spot chest radiograph confirms good catheter position. No pneumothorax. Catheter was flushed and primed per protocol. Catheter secured externally with O Prolene sutures. The right IJ dermatotomy site was closed with Dermabond. COMPLICATIONS: COMPLICATIONS None immediate FLUOROSCOPY TIME:  30 seconds; 4 mGy COMPARISON:  None IMPRESSION: 1. Technically successful placement of tunneled right IJ hemodialysis catheter with ultrasound and fluoroscopic guidance. Ready for routine use. ACCESS: Remains approachable for percutaneous intervention as needed. Electronically Signed   By: Lucrezia Europe  M.D.   On: 05/08/2017 11:28     Medications:     Scheduled Medications: . aspirin EC  81 mg Oral Daily  . atorvastatin  40 mg Oral q1800  . chlorhexidine  15 mL Mouth Rinse BID  . Chlorhexidine Gluconate Cloth  6 each Topical Daily  . citalopram  10 mg Oral Daily  . feeding supplement (ENSURE ENLIVE)  237 mL Oral TID BM  . ferrous [id number] C-folic acid  1 capsule Oral BID BM  . gabapentin  100 mg Oral BID  . mouth rinse  15 mL Mouth Rinse q12n4p  . pantoprazole  40 mg Oral Daily  . warfarin  2 mg Oral ONCE-1800  .  Warfarin - Pharmacist Dosing Inpatient   Does not apply q1800    Infusions: . sodium chloride Stopped (04/30/17 0800)  . DOBUTamine 1.5 mcg/kg/min (05/09/17 0500)    PRN Medications: levalbuterol, ondansetron (ZOFRAN) IV, oxyCODONE, simethicone, sodium chloride flush, sodium chloride flush   Patient Profile   Adon Gehlhausen is a 52 y.o. male with a PMH of ICM ('09 PCI to LAD, mRCA, '10 PCI to Lcx and '12 mRCA), HFrEF (EF ~20% for >5 years) s/p HM-III VAD placement on 12/27 due to end-stage systolic HF.   Assessment/Plan:    1. Acute on chronic systolic HF: Echo 32/95/18 with LVEF 20-25%, Mild MR, Mild/Mod MR, Severe LAE, Mildly reduced RV, PA peak pressure 56 mm Hg.  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 Extubated, initially stopped epinephrine/norepinephrine but restarted with fall in UOP and MAP.  With progressive renal failure, pulmonary edema, and hypotension, he was re-intubated on 12/30.   CVVH was begun.  12/31 was difficult day with refractory shock (MAP in 60s).  Reviewed 12/31 echo which showed no significant pericardial effusion, adequate RV function, and IV septum near mid-line.  Off NE/Epi.  - Coox 68.6% on dobutamine 1.5 mg/kg/min. Will stop dobutamine this am. And follow.  - UOP slightly improved with 200 cc out last night, and 500 in bladder this am.  - CVP 15-16, currently on iHD  - Continue aspirin  81 mg daily.  - Warfarin goal INR 2-2.5. INR 1.46 today. Heparin on hold with driveline site bleeding. 2. CAD s/p PCI to mid RCA and PLOM with DES x 2 and DES to mid LAD in 1/18.   - Continue ASA and statin. No change   3. PAF: - Remains in NSR. Personally reviewed.  4. ARF on CKD stage III: Suspect some degree of intrinsic renal disease prior to LVAD placement (baseline creatinine around 1.5) with probable ATN from intra-op/peri-op hypotension and development of vasodilatory/septic shock.  CVVHD stopped 05/04/2017. Minimal UOP still.   - First iHD 05/06/17 without problems.  - Tunneled HD cath placed 05/08/17.  5. Anemia: s/p 1 u PRBCs 12/30, 04/25/17, 1/8.  - Hgb 7.8 this am. No further active bleeding. Trying to limit transfusion with possibility of transplant down the road. Follow closely.  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 ID: Started on cefepime empirically with septic/vasodilatory shock, PCT was 31 04/25/17.  ABX broadened to Meropenem 04/27/16 with + Achromobacter on tracheal aspirate.  - Finished 05/07/17 (10 days)  PCT 8.31 04/27/17 -> 3.87 ->0.94 - WBC lower at 10.6. 7. Elevated LFTs due to shock liver - LFTs nearly normal. 8. Hyponatremia:  - Better after HD.  9. Driveline bleeding - Resolved after stitch placed by Dr. Cyndia Bent.  - Coumadin resume last night.  - Heparin remains off. Will discuss with MD.   Length of Stay: [place], [place] 05/09/2017, 7:23 AM  VAD Team --- VAD ISSUES ONLY--- Pager [phone] (7am - 7am)  Advanced Heart Failure Team  Pager [phone] (M-F; 7a - 4p)  Please contact [facility] for night-coverage after hours (4p -7a ) and weekends on [email or web address]  Patient seen and examined with the above-signed Advanced Practice Provider and/or Housestaff. I personally reviewed laboratory data, imaging studies and relevant notes. I independently examined the patient and formulated the important aspects of the plan. I have edited the  note to reflect any of my changes or salient points. I have personally discussed the plan  with the patient and/or family.  Currently tolerating iHD well via tunneled catheter. Remains on dobutamine 1.5. Co-ox ok. Will wean off after HD. MAP stable. Urine output picking up slowly. Bleeding at driveline sie resolved with stitch. Site still not fully incorporated. INR remains low. Will restart heparin. Discussed with PharmD personally. Can move to [place] today. Continue teaching and ambulation. VAD interrogated personally. Parameters stable.  Glori Bickers, MD  11:10 PM

## 2017-05-09 NOTE — Progress Notes (Signed)
Exit site care performed:  Drive line exit wound care. Existing VAD dressing removed and site care performed using sterile technique.Gauze and silver are clean and dry.  Surgiceal strip completely removed per Dr Cyndia Bent verbal order.Drive line exit site cleaned with Chlora prep applicators x 2, allowed to dry, and gauze dressing with silver strip re-applied. Exit site healing and unincorporated, the velour is fully implanted at exit site. No redness, tenderness, foul odor or rash noted.    Dr Cyndia Bent updated on current drive line site. VAD Coordinators will change again tomorrow.  [name] RN, VAD Coordinator 24/7 pager [phone]

## 2017-05-09 NOTE — Progress Notes (Signed)
[facility] Progress Note    Assessment/ Plan:   1.  AKI on CKD III: likely due to ATN from shock s/p LVAD placement with underlying arterionephrosclerosis.  Required CRRT initially and now has been transitioned to IHD (1/11).  For IHD 1/15. We are attempting high-dose Lasix/ metolazone but not sure if it will make a big impact yet as not a lot of signs of recovery.  Getting tunneled HD cath today with IR.  No heparin with HD given drive line bleeding.  We will make daily determination on necessity of dialysis as he appears to be making more urine.  2.  Acute on chronic systolic CHF: s/p Heartmate III 12/27.  Advanced HF and cardiothoracic surgery following.    3.  Elevated LFTs: shock liver, slowly getting better  4.  Hyponatremia: vol related, expect to improve with HD.  5.  Achromobacter pneumonia: s/p antibiotics (ended 1/10)  Subjective:    UOP better after lasix challenge.  IHD today (rolled over from yesterday).     Objective:   BP 95/75   Pulse 89   Temp 97.9 F (36.6 C)   Resp 16   Ht 5\' 5"  (1.651 m)   Wt 77.1 kg (169 lb 15.6 oz)   SpO2 96%   BMI 28.29 kg/m   Intake/Output Summary (Last 24 hours) at 05/09/2017 1024 Last data filed at 05/09/2017 0900 Gross per 24 hour  Intake 499.1 ml  Output 425 ml  Net 74.1 ml   Weight change: 1.112 kg (2 lb 7.2 oz)  Physical Exam: BOF:BPZWC gentleman, NAD, lying in bed HEENT: + JVD CVS: mechanical hum of LVAD Resp: mildly increased WOB Abd: + drive line LUQ  HEN:2+ LE edema  Imaging: Ir Fluoro Guide Cv Line Right  Result Date: 05/08/2017 CLINICAL DATA:  Heart failure with LVAD. Renal insufficiency. Indwelling right subclavian temporary hemodialysis catheter. Needs long-term access for hemodialysis. EXAM: TUNNELED HEMODIALYSIS CATHETER PLACEMENT WITH ULTRASOUND AND FLUOROSCOPIC GUIDANCE TECHNIQUE: The procedure, risks, benefits, and alternatives were explained to the patient. Questions regarding the  procedure were encouraged and answered. The patient understands and consents to the procedure. As antibiotic prophylaxis, cefazolin 2 g was ordered pre-procedure and administered intravenously within one hour of incision.Patency of the right IJ vein was confirmed with ultrasound with image documentation. An appropriate skin site was determined. Region was prepped using maximum barrier technique including cap and mask, sterile gown, sterile gloves, large sterile sheet, and Chlorhexidine as cutaneous antisepsis. The region was infiltrated locally with 1% lidocaine. Intravenous Fentanyl and Versed were administered as conscious sedation during continuous monitoring of the patient's level of consciousness and physiological / cardiorespiratory status by the radiology RN, with a total moderate sedation time of 10 minutes. Under real-time ultrasound guidance, the right IJ vein was accessed with a 21 gauge micropuncture needle; the needle tip within the vein was confirmed with ultrasound image documentation. Needle exchanged over the 018 guidewire for transitional dilator, which allowed advancement of a Benson wire into the IVC. Over this, an MPA catheter was advanced. A Palindrome 23 hemodialysis catheter was tunneled from the right anterior chest wall approach to the right IJ dermatotomy site. The MPA catheter was exchanged over an Amplatz wire for serial vascular dilators which allow placement of a peel-away sheath, through which the catheter was advanced under intermittent fluoroscopy, positioned with its tips in the proximal and midright atrium. Spot chest radiograph confirms good catheter position. No pneumothorax. Catheter was flushed and primed per protocol. Catheter secured externally  with O Prolene sutures. The right IJ dermatotomy site was closed with Dermabond. COMPLICATIONS: COMPLICATIONS None immediate FLUOROSCOPY TIME:  30 seconds; 4 mGy COMPARISON:  None IMPRESSION: 1. Technically successful placement of  tunneled right IJ hemodialysis catheter with ultrasound and fluoroscopic guidance. Ready for routine use. ACCESS: Remains approachable for percutaneous intervention as needed. Electronically Signed   By: Lucrezia Europe M.D.   On: 05/08/2017 11:28   Ir US Guide Vasc Access Right  Result Date: 05/08/2017 CLINICAL DATA:  Heart failure with LVAD. Renal insufficiency. Indwelling right subclavian temporary hemodialysis catheter. Needs long-term access for hemodialysis. EXAM: TUNNELED HEMODIALYSIS CATHETER PLACEMENT WITH ULTRASOUND AND FLUOROSCOPIC GUIDANCE TECHNIQUE: The procedure, risks, benefits, and alternatives were explained to the patient. Questions regarding the procedure were encouraged and answered. The patient understands and consents to the procedure. As antibiotic prophylaxis, cefazolin 2 g was ordered pre-procedure and administered intravenously within one hour of incision.Patency of the right IJ vein was confirmed with ultrasound with image documentation. An appropriate skin site was determined. Region was prepped using maximum barrier technique including cap and mask, sterile gown, sterile gloves, large sterile sheet, and Chlorhexidine as cutaneous antisepsis. The region was infiltrated locally with 1% lidocaine. Intravenous Fentanyl and Versed were administered as conscious sedation during continuous monitoring of the patient's level of consciousness and physiological / cardiorespiratory status by the radiology RN, with a total moderate sedation time of 10 minutes. Under real-time ultrasound guidance, the right IJ vein was accessed with a 21 gauge micropuncture needle; the needle tip within the vein was confirmed with ultrasound image documentation. Needle exchanged over the 018 guidewire for transitional dilator, which allowed advancement of a Benson wire into the IVC. Over this, an MPA catheter was advanced. A Palindrome 23 hemodialysis catheter was tunneled from the right anterior chest wall approach to  the right IJ dermatotomy site. The MPA catheter was exchanged over an Amplatz wire for serial vascular dilators which allow placement of a peel-away sheath, through which the catheter was advanced under intermittent fluoroscopy, positioned with its tips in the proximal and midright atrium. Spot chest radiograph confirms good catheter position. No pneumothorax. Catheter was flushed and primed per protocol. Catheter secured externally with O Prolene sutures. The right IJ dermatotomy site was closed with Dermabond. COMPLICATIONS: COMPLICATIONS None immediate FLUOROSCOPY TIME:  30 seconds; 4 mGy COMPARISON:  None IMPRESSION: 1. Technically successful placement of tunneled right IJ hemodialysis catheter with ultrasound and fluoroscopic guidance. Ready for routine use. ACCESS: Remains approachable for percutaneous intervention as needed. Electronically Signed   By: Lucrezia Europe M.D.   On: 05/08/2017 11:28    Labs: BMET Recent Labs  Lab 05/05/17 1653 05/06/17 0236 05/06/17 1732 05/07/17 0456 05/07/17 1530 05/08/17 0530 05/08/17 1552 05/09/17 0224  NA 126* 122* 132* 129* 130* 130* 129* 130*  K 4.7 4.6 3.8 4.1 4.6 4.3 4.2 4.3  CL 94* 94* 97* 96* 96* 94* 94* 95*  CO2 24 21* 25 23 24 25 23 24   GLUCOSE 102* 83 80 86 92 92 109* 129*  BUN 22* 25* 11 16 19  21* 26* 29*  CREATININE 4.09* 4.63* 2.84* 3.81* 4.36* 4.97* 5.40* 5.73*  CALCIUM 8.2* 8.1* 8.1* 8.1* 8.3* 8.6* 8.3* 8.4*  PHOS 5.4* 5.8* 3.9  --  5.1* 5.7* 6.1* 6.5*   CBC Recent Labs  Lab 05/07/17 0456 05/07/17 1530 05/08/17 0530 05/09/17 0350  WBC 13.4* 12.7* 10.9* 10.6*  HGB 7.5* 7.7* 8.9* 7.8*  HCT 23.3* 24.4* 27.7* 24.9*  MCV 82.6 83.3 84.7 83.6  PLT 358 377 360 324    Medications:    . aspirin EC  81 mg Oral Daily  . atorvastatin  40 mg Oral q1800  . chlorhexidine  15 mL Mouth Rinse BID  . Chlorhexidine Gluconate Cloth  6 each Topical Daily  . citalopram  10 mg Oral Daily  . feeding supplement (ENSURE ENLIVE)  237 mL Oral TID BM   . ferrous [id number] C-folic acid  1 capsule Oral BID BM  . gabapentin  100 mg Oral BID  . mouth rinse  15 mL Mouth Rinse q12n4p  . [START ON 05/10/2017] metolazone  5 mg Oral Once  . pantoprazole  40 mg Oral Daily  . warfarin  2 mg Oral ONCE-1800  . Warfarin - Pharmacist Dosing Inpatient   Does not apply Haigler, MD [facility] pgr [phone] 05/09/2017, 10:24 AM

## 2017-05-09 NOTE — Progress Notes (Addendum)
Patient ID: David [name], male   DOB: November 24, 1965, 52 y.o.   MRN: [id number] HeartMate 3 Rounding Note  Subjective:    No bleeding around drive line today. Co-ox 69 this am Dobutamine turned off. Had HD this am. CVP 15 this afternoon. Not sure how accurate this is.  Passed 175 cc urine this am and 325 cc this afternoon.  Wife reports frequent jerking movements today.  LVAD INTERROGATION:  HeartMate IIl LVAD:  Flow 5 liters/min, speed 5400, power 4, PI 1.8    Objective:    Vital Signs:   Temp:  [97.1 F (36.2 C)-99 F (37.2 C)] 99 F (37.2 C) (01/15 1702) Pulse Rate:  [69-121] 89 (01/15 1700) Resp:  [8-33] 14 (01/15 1700) BP: (74-126)/(58-93) 77/65 (01/15 1700) SpO2:  [92 %-100 %] 93 % (01/15 1700) FiO2 (%):  [99 %] 99 % (01/15 0555) Weight:  [76.2 kg (167 lb 15.9 oz)-77.1 kg (170 lb)] 76.2 kg (167 lb 15.9 oz) (01/15 1025) Last BM Date: 05/05/17 Mean arterial Pressure 70's  Intake/Output:   Intake/Output Summary (Last 24 hours) at 05/09/2017 1725 Last data filed at 05/09/2017 1700 Gross per 24 hour  Intake 286.83 ml  Output 2725 ml  Net -2438.17 ml     Physical Exam: General:  Chronically ill-appearing but awake and alert.   No resp difficulty Neck: left IJ central line Cor: intermittent heart sounds with LVAD hum present. Lungs: clear Abdomen: soft, nontender, nondistended. Good bowel sounds. Extremities: minimal edema in arms Neuro: alert & orientedx3,  moves all 4 extremities w/o difficulty. Affect pleasant Drive line site examined by picture when VAD coordinator did dressing change. There is no bleeding or drainage. The skin is purplish around the drive line.  The sternotomy and abdominal incision are healing well.  Telemetry: sinus rhythm 86  Labs: Basic Metabolic Panel: Recent Labs  Lab 05/05/17 0344  05/06/17 0236  05/07/17 0456 05/07/17 1530 05/08/17 0530 05/08/17 1552 05/09/17 0224 05/09/17 1605  NA 128*   < > 122*   < > 129* 130* 130* 129*  130* 131*  K 4.6   < > 4.6   < > 4.1 4.6 4.3 4.2 4.3 3.5  CL 96*   < > 94*   < > 96* 96* 94* 94* 95* 95*  CO2 25   < > 21*   < > 23 24 25 23 24 26   GLUCOSE 89   < > 83   < > 86 92 92 109* 129* 131*  BUN 15   < > 25*   < > 16 19 21* 26* 29* 10  CREATININE 3.32*   < > 4.63*   < > 3.81* 4.36* 4.97* 5.40* 5.73* 3.22*  CALCIUM 8.3*   < > 8.1*   < > 8.1* 8.3* 8.6* 8.3* 8.4* 7.9*  MG 2.3  --  2.3  --  1.9  --  2.2  --  2.1  --   PHOS 4.5   < > 5.8*   < >  --  5.1* 5.7* 6.1* 6.5* 3.2   < > = values in this interval not displayed.    Liver Function Tests: Recent Labs  Lab 05/07/17 0456 05/07/17 1530 05/08/17 0530 05/08/17 1552 05/09/17 0224 05/09/17 1605  AST 45*  --   --   --   --   --   ALT 32  --   --   --   --   --   ALKPHOS 261*  --   --   --   --   --  BILITOT 3.2*  --   --   --   --   --   PROT 7.0  --   --   --   --   --   ALBUMIN 1.7* 1.7* 1.8* 1.7* 1.7* 1.6*   No results for input(s): LIPASE, AMYLASE in the last 168 hours. No results for input(s): AMMONIA in the last 168 hours.  CBC: Recent Labs  Lab 05/06/17 0236 05/07/17 0456 05/07/17 1530 05/08/17 0530 05/09/17 0350  WBC 15.2* 13.4* 12.7* 10.9* 10.6*  HGB 8.0* 7.5* 7.7* 8.9* 7.8*  HCT 24.9* 23.3* 24.4* 27.7* 24.9*  MCV 82.5 82.6 83.3 84.7 83.6  PLT 376 358 377 360 324    INR: Recent Labs  Lab 05/06/17 0236 05/07/17 0456 05/07/17 1530 05/08/17 0530 05/09/17 0224  INR 1.82 1.95 1.68 1.43 1.49    Other results:  EKG:   Imaging: Ir Fluoro Guide Cv Line Right  Result Date: 05/08/2017 CLINICAL DATA:  Heart failure with LVAD. Renal insufficiency. Indwelling right subclavian temporary hemodialysis catheter. Needs long-term access for hemodialysis. EXAM: TUNNELED HEMODIALYSIS CATHETER PLACEMENT WITH ULTRASOUND AND FLUOROSCOPIC GUIDANCE TECHNIQUE: The procedure, risks, benefits, and alternatives were explained to the patient. Questions regarding the procedure were encouraged and answered. The patient  understands and consents to the procedure. As antibiotic prophylaxis, cefazolin 2 g was ordered pre-procedure and administered intravenously within one hour of incision.Patency of the right IJ vein was confirmed with ultrasound with image documentation. An appropriate skin site was determined. Region was prepped using maximum barrier technique including cap and mask, sterile gown, sterile gloves, large sterile sheet, and Chlorhexidine as cutaneous antisepsis. The region was infiltrated locally with 1% lidocaine. Intravenous Fentanyl and Versed were administered as conscious sedation during continuous monitoring of the patient's level of consciousness and physiological / cardiorespiratory status by the radiology RN, with a total moderate sedation time of 10 minutes. Under real-time ultrasound guidance, the right IJ vein was accessed with a 21 gauge micropuncture needle; the needle tip within the vein was confirmed with ultrasound image documentation. Needle exchanged over the 018 guidewire for transitional dilator, which allowed advancement of a Benson wire into the IVC. Over this, an MPA catheter was advanced. A Palindrome 23 hemodialysis catheter was tunneled from the right anterior chest wall approach to the right IJ dermatotomy site. The MPA catheter was exchanged over an Amplatz wire for serial vascular dilators which allow placement of a peel-away sheath, through which the catheter was advanced under intermittent fluoroscopy, positioned with its tips in the proximal and midright atrium. Spot chest radiograph confirms good catheter position. No pneumothorax. Catheter was flushed and primed per protocol. Catheter secured externally with O Prolene sutures. The right IJ dermatotomy site was closed with Dermabond. COMPLICATIONS: COMPLICATIONS None immediate FLUOROSCOPY TIME:  30 seconds; 4 mGy COMPARISON:  None IMPRESSION: 1. Technically successful placement of tunneled right IJ hemodialysis catheter with ultrasound  and fluoroscopic guidance. Ready for routine use. ACCESS: Remains approachable for percutaneous intervention as needed. Electronically Signed   By: Lucrezia Europe M.D.   On: 05/08/2017 11:28   Ir US Guide Vasc Access Right  Result Date: 05/08/2017 CLINICAL DATA:  Heart failure with LVAD. Renal insufficiency. Indwelling right subclavian temporary hemodialysis catheter. Needs long-term access for hemodialysis. EXAM: TUNNELED HEMODIALYSIS CATHETER PLACEMENT WITH ULTRASOUND AND FLUOROSCOPIC GUIDANCE TECHNIQUE: The procedure, risks, benefits, and alternatives were explained to the patient. Questions regarding the procedure were encouraged and answered. The patient understands and consents to the procedure. As antibiotic prophylaxis, cefazolin 2  g was ordered pre-procedure and administered intravenously within one hour of incision.Patency of the right IJ vein was confirmed with ultrasound with image documentation. An appropriate skin site was determined. Region was prepped using maximum barrier technique including cap and mask, sterile gown, sterile gloves, large sterile sheet, and Chlorhexidine as cutaneous antisepsis. The region was infiltrated locally with 1% lidocaine. Intravenous Fentanyl and Versed were administered as conscious sedation during continuous monitoring of the patient's level of consciousness and physiological / cardiorespiratory status by the radiology RN, with a total moderate sedation time of 10 minutes. Under real-time ultrasound guidance, the right IJ vein was accessed with a 21 gauge micropuncture needle; the needle tip within the vein was confirmed with ultrasound image documentation. Needle exchanged over the 018 guidewire for transitional dilator, which allowed advancement of a Benson wire into the IVC. Over this, an MPA catheter was advanced. A Palindrome 23 hemodialysis catheter was tunneled from the right anterior chest wall approach to the right IJ dermatotomy site. The MPA catheter was  exchanged over an Amplatz wire for serial vascular dilators which allow placement of a peel-away sheath, through which the catheter was advanced under intermittent fluoroscopy, positioned with its tips in the proximal and midright atrium. Spot chest radiograph confirms good catheter position. No pneumothorax. Catheter was flushed and primed per protocol. Catheter secured externally with O Prolene sutures. The right IJ dermatotomy site was closed with Dermabond. COMPLICATIONS: COMPLICATIONS None immediate FLUOROSCOPY TIME:  30 seconds; 4 mGy COMPARISON:  None IMPRESSION: 1. Technically successful placement of tunneled right IJ hemodialysis catheter with ultrasound and fluoroscopic guidance. Ready for routine use. ACCESS: Remains approachable for percutaneous intervention as needed. Electronically Signed   By: Lucrezia Europe M.D.   On: 05/08/2017 11:28     Medications:     Scheduled Medications: . aspirin EC  81 mg Oral Daily  . atorvastatin  40 mg Oral q1800  . chlorhexidine  15 mL Mouth Rinse BID  . Chlorhexidine Gluconate Cloth  6 each Topical Daily  . citalopram  10 mg Oral Daily  . feeding supplement (ENSURE ENLIVE)  237 mL Oral TID BM  . ferrous [id number] C-folic acid  1 capsule Oral BID BM  . gabapentin  100 mg Oral BID  . mouth rinse  15 mL Mouth Rinse q12n4p  . [START ON 05/10/2017] metolazone  5 mg Oral Once  . pantoprazole  40 mg Oral Daily  . Warfarin - Pharmacist Dosing Inpatient   Does not apply q1800    Infusions: . sodium chloride Stopped (04/30/17 0800)  . [START ON 05/10/2017] furosemide    . heparin 1,250 Units/hr (05/09/17 1610)    PRN Medications: levalbuterol, ondansetron (ZOFRAN) IV, oxyCODONE, simethicone, sodium chloride flush, sodium chloride flush   Assessment:   1. Ischemic cardiomyopathy s/p multiple PCI's with chronic systolic heart failure and EF 20% on home dobutamine preop with significant improvement in symptoms. Evaluated at [facility] for heart  transplant but LVAD felt to be the best treatment for him at this time. POD20s/p HeartMate III LVAD. He is critically ill with multi-system organ failure but improving.Hemodynamics stable off dobutamine 2.H/OHypertension 3.H/OHyperlipidemia 4. PAF in sinuspreopon amiodarone and Eliquis.Now back in sinus off amiodarone.  5.H/OStage III CKD. Acute on chronic postop renal failureon intermittent HD. Making some urine. Still hopeful for recovery.  6. LBBB s/p CRT-D upgrade in 10/2016 without improvement in symptoms or EF. LV epicardial leads cut off at the time of surgery. 7. Prior smoking with mild obstruction and severe diffusion  defect on PFT's. CXRhas been stable. Continue IS. 8. Postop leukocytosis and fever. WBC down to 10.6. Would like to start peripheral IV and DC central line. 9. Marked rise in bilirubin and transaminases. Likely due to pump runand right sided congestion, hypotension.Resolving. 10. INR is1.49 today. Coumadin per pharmacy. Started heparin drip today since no bleeding around drive line.    11. Severe malnutrition with albumin 1.6. Taking PO fairly well. Continue to encourage nutrition.    12. PT    13. Anemia: Hgb improved after transfusion last night.      14. Jerking movements of unclear etiology but I wonder if related to Celexa or Neurontin, especially with renal failure.  Plan/Discussion:    Overall he has been stable. MAP in good range off pressors.  VAD flow has been great.  Intermittent HD. Bleeding around the exit site is due to raw tissue that is slow to heal. I will remove the pursestring suture tomorrow to minimize the chance of necrosis around the drive line. May need an oral antibiotic with redness. Stopped with a suture. Will continue coumadin heparin if no bleeding.  Continue to mobilize and encourage nutrition.  I reviewed the LVAD parameters from today, and compared the results to the patient's prior recorded data.  No programming changes  were made.  The LVAD is functioning within specified parameters.   LVAD interrogation was negative for any significant power changes, alarms or PI events/speed drops.  LVAD equipment check completed and is in good working order.  Back-up equipment present.   LVAD education done on emergency procedures and precautions and reviewed exit site care.  Length of Stay: [address]  Gaye Pollack 05/09/2017, 5:25 PM

## 2017-05-09 NOTE — Progress Notes (Signed)
OT Cancellation Note  Patient Details Name: David [name] MRN: [id number] DOB: 1965/12/07   Cancelled Treatment:    Reason Eval/Treat Not Completed: Patient at procedure or test/ unavailable.  Will reattempt.  [place], OTR/L [phone]   [name] M 05/09/2017, 10:53 AM

## 2017-05-09 NOTE — Progress Notes (Signed)
LVAD Coordinator Rounding Note:  Admitted12/26due to decompensated heart failure.  Heartmate IIILVAD [facility].AV valve oversewn due to aortic insufficiency.  No bleeding from drive line overnight.   Vital signs: Tmax: 98.5 HR:86 Doppler Pressure: 86 Automatic cuff:  106/90 (97) O2 Sat:95 % on 4LNC Wt:165>164>156>160>160>154>158>156>157>160>164>167>169lbs  LVAD interrogation reveals:  Speed:5300 Flow: 4.9 Power:4.0 w PI:3.6 Alarms:none Events:none Hematocrit:25 Fixed speed:5300 Low speed limit:5000  Labs:  LDH trend:205>407>576>923>967>612>528>492>467>489>441>434>405>376>355>315>265  INR trend:1.34>1.52>1.73>1.95>2.32>3.03>3.80>3.16>2.39>2.23>1.70>1.65>1.91>1.60>1.43>1.49  Anticoagulation Plan: -INR Goal:2-2.5  -ASA Dose: 81 mg   Blood Products: Intra op:2 units/FFP 04/20/17  Post op: PRBC's 1 unit 12/29 1 unit 12/30 1 unit 04/25/17 1 unit 05/02/17 1 unit 05/08/17  Device:Medtronic BiV -Therapies:off  Arrhythmia: 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 (on amiodarone and Eliquis for this) - Afib 12/29 - 04/28/17 - NSR 1/5 - present  Nitric Oxide: -  stopped on 12/30 due to initiation of BiPap  Respiratory: - Extubated 12/28 - Reintubated 04/23/2017 - Extubated 04/26/2017  Renal:  04/23/17 - CRRT started for AKI - stopped 05/05/17  Infection: - 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 VAD coordinator will change dressing this afternoon.   2. Please call VAD Pager for any equipment concerns or drive line dressing questions/concerns.   Tanda Rockers RN, VAD Coordinator 24/7 pager [phone]

## 2017-05-09 NOTE — Progress Notes (Signed)
Physical Therapy Treatment Patient Details Name: David [name] MRN: [id number] DOB: [birth year] Today's Date: 05/09/2017    History of Present Illness This 52 y.o. male admitted for LVAD. Pt extubated 12/28.   on 12/30, pt developed respiratory distress with pulmonary edema, and increased creatanine.  He was intubated 12/30 > 04/26/16, and CRRT initiated 12/30 and discontinued 1/10.    PMH includes:  Acute on chronic systolic HF, CAD, PAF, CKD stage III, LBBB, frequent PVCs, seizures, PNS, MI, ischemic cardiomyopathy, s/p AICD     PT Comments    Pt required max VCs for encouragement to participate in therapy this pm.  Pt refused to set up transfer to batteries for ambulation but did participate in recall requiring cues for correction 50% of the time.  Pt performed transfer from battery back to power cord with mod VCs for sequencing.  Pt required re-education on sternal precautions and reports his wife will be in charge of his power sources.  Plan next session to continue education on transferring power sources and making sure he can recall what items are needed to transfer power sources.  Informed RN of concern for memory in regards to power source management.   Follow Up Recommendations  Supervision/Assistance - 24 hour;Home health PT     Equipment Recommendations  Other (comment)(4 wheeled RW with a seat (rollator))    Recommendations for Other Services       Precautions / Restrictions Precautions Precautions: Fall;Sternal;Other (comment)(LVAD) Restrictions Weight Bearing Restrictions: No Other Position/Activity Restrictions: sternal precautions    Mobility  Bed Mobility               General bed mobility comments: Pt sitting on edge of bed on arrival and required cues for line and lead management before standing.    Transfers Overall transfer level: Needs assistance Equipment used: 4-wheeled walker Transfers: Sit to/from Stand Sit to Stand: Min guard         General  transfer comment: Assist for safety and line management  Ambulation/Gait Ambulation/Gait assistance: Min guard Ambulation Distance (Feet): 680 Feet Assistive device: 4-wheeled walker Gait Pattern/deviations: Step-through pattern;Decreased stride length Gait velocity: decr Gait velocity interpretation: Below normal speed for age/gender General Gait Details: Assist for safety and line management. Pt amb on 2L of O2 with dyspnea 2/4 and 1 brief standing rest break. Poor wave form on SPO2 and unable to obtain accurate reading.  RN aware.     Stairs            Wheelchair Mobility    Modified Rankin (Stroke Patients Only)       Balance Overall balance assessment: Needs assistance Sitting-balance support: Feet supported Sitting balance-Leahy Scale: Fair     Standing balance support: No upper extremity supported Standing balance-Leahy Scale: Fair Standing balance comment: supervision for static standing without UE support, required intermittent assistance to use urinal due to decreased strength in B hands.                              Cognition Arousal/Alertness: Awake/alert Behavior During Therapy: WFL for tasks assessed/performed Overall Cognitive Status: Impaired/Different from baseline Area of Impairment: Attention;Memory                     Memory: Decreased short-term memory;Decreased recall of precautions   Safety/Judgement: Decreased awareness of safety;Decreased awareness of deficits     General Comments: Pt self reports that his memory isn't as good as it  normally is and that he wife will be responsible for the management of the battery sources.        Exercises      General Comments        Pertinent Vitals/Pain Pain Assessment: No/denies pain    Home Living                      Prior Function            PT Goals (current goals can now be found in the care plan section) Acute Rehab PT Goals Patient Stated Goal: To get  stronger and go home  Potential to Achieve Goals: Good Progress towards PT goals: Progressing toward goals    Frequency    Min 3X/week      PT Plan Current plan remains appropriate    Co-evaluation              AM-PAC PT "6 Clicks" Daily Activity  Outcome Measure  Difficulty turning over in bed (including adjusting bedclothes, sheets and blankets)?: A Little Difficulty moving from lying on back to sitting on the side of the bed? : Unable Difficulty sitting down on and standing up from a chair with arms (e.g., wheelchair, bedside commode, etc,.)?: Unable Help needed moving to and from a bed to chair (including a wheelchair)?: A Little Help needed walking in hospital room?: A Little Help needed climbing 3-5 steps with a railing? : A Little 6 Click Score: 14    End of Session Equipment Utilized During Treatment: Oxygen;Other (comment)(LVAD back up bag.  ) Activity Tolerance: Patient tolerated treatment well Patient left: with call bell/phone within reach;in bed Nurse Communication: Mobility status PT Visit Diagnosis: Unsteadiness on feet (R26.81);Muscle weakness (generalized) (M62.81);Difficulty in walking, not elsewhere classified (R26.2)     Time: 4599-7741 PT Time Calculation (min) (ACUTE ONLY): 34 min  Charges:  $Gait Training: 8-22 mins $Therapeutic Activity: 8-22 mins                    G Codes:       [name], PTA pager [place] 05/09/2017, 3:52 PM

## 2017-05-09 NOTE — Progress Notes (Signed)
David [name] for Coumadin/Heparin (on hold) Indication: LVAD  Allergies  Allergen Reactions  . Plavix [Clopidogrel Bisulfate] Hives   Patient Measurements: Height: 5\' 5"  (165.1 cm) Weight: 169 lb 15.6 oz (77.1 kg) IBW/kg (Calculated) : 61.5  Vital Signs: Temp: 97.1 F (36.2 C) (01/15 0555) Temp Source: Oral (01/15 0555) BP: 126/93 (01/15 0600) Pulse Rate: 90 (01/15 0600)  Labs: Recent Labs    05/06/17 1432  05/06/17 2144  05/07/17 0456 05/07/17 1530 05/08/17 0530 05/08/17 1552 05/09/17 0224 05/09/17 0350  HGB  --   --   --    < > 7.5* 7.7* 8.9*  --   --  7.8*  HCT  --   --   --    < > 23.3* 24.4* 27.7*  --   --  24.9*  PLT  --   --   --    < > 358 377 360  --   --  324  LABPROT  --   --   --    < > 22.1* 19.6* 17.3*  --  17.9*  --   INR  --   --   --    < > 1.95 1.68 1.43  --  1.49  --   HEPARINUNFRC 0.45  --  0.37  --  0.36  --   --   --   --   --   CREATININE  --    < >  --   --  3.81* 4.36* 4.97* 5.40* 5.73*  --    < > = values in this interval not displayed.   Estimated Creatinine Clearance: 14.3 mL/min (A) (by C-G formula based on SCr of 5.73 mg/dL (H)).  Assessment: 52 yo male s/p LVAD implantation 12/27.   IV heparin and Coumadin on hold for tunneled catheter yesterday.  Also with bleeding from driveline site.  Coumadin restarted last night, heparin remains on hold.  Driveline site bleeding improved today, did not require dressing change overnight.    Goal of Therapy:  Heparin 0.3-0.7 units/ml INR 2-2.5 Monitor platelets by anticoagulation protocol: Yes    Plan: Coumadin 2 mg po again tonight. Daily PT/INR. Dr. Cyndia Bent to consider resuming IV heparin until INR > 2.  David [name], BCPS  Clinical Pharmacist Pager [phone]  05/09/2017 6:54 AM

## 2017-05-09 NOTE — Progress Notes (Signed)
CSW met with patient at bedside. Patient's wife and daughter brought some food from home and patient was delighted to eat. Patient spoke about his recovery and appeared very positive despite recent challenges. Patient in good spirits today and enjoying his visit with his family. CSW provided supportive intervention and will continue to follow for support throughout implant hospitalization. Raquel Sarna, [place], [place]

## 2017-05-10 LAB — PROTIME-INR
INR: 1.63
PROTHROMBIN TIME: 19.2 s — AB (ref 11.4–15.2)

## 2017-05-10 LAB — CBC
HCT: 24.6 % — ABNORMAL LOW (ref 39.0–52.0)
HEMATOCRIT: 24.8 % — AB (ref 39.0–52.0)
Hemoglobin: 7.8 g/dL — ABNORMAL LOW (ref 13.0–17.0)
Hemoglobin: 7.9 g/dL — ABNORMAL LOW (ref 13.0–17.0)
MCH: 26.7 pg (ref 26.0–34.0)
MCH: 26.8 pg (ref 26.0–34.0)
MCHC: 31.7 g/dL (ref 30.0–36.0)
MCHC: 31.9 g/dL (ref 30.0–36.0)
MCV: 84.2 fL (ref 78.0–100.0)
MCV: 84.1 fL (ref 78.0–100.0)
PLATELETS: 306 10*3/uL (ref 150–400)
Platelets: 324 10*3/uL (ref 150–400)
RBC: 2.92 MIL/uL — AB (ref 4.22–5.81)
RBC: 2.95 MIL/uL — ABNORMAL LOW (ref 4.22–5.81)
RDW: 19.9 % — AB (ref 11.5–15.5)
RDW: 20.4 % — AB (ref 11.5–15.5)
WBC: 9.8 10*3/uL (ref 4.0–10.5)
WBC: 11.4 10*3/uL — ABNORMAL HIGH (ref 4.0–10.5)

## 2017-05-10 LAB — RENAL FUNCTION PANEL
ANION GAP: 10 (ref 5–15)
Albumin: 1.7 g/dL — ABNORMAL LOW (ref 3.5–5.0)
Albumin: 1.6 g/dL — ABNORMAL LOW (ref 3.5–5.0)
Anion gap: 10 (ref 5–15)
BUN: 12 mg/dL (ref 6–20)
BUN: 15 mg/dL (ref 6–20)
CHLORIDE: 96 mmol/L — AB (ref 101–111)
CO2: 26 mmol/L (ref 22–32)
CO2: 27 mmol/L (ref 22–32)
CREATININE: 3.68 mg/dL — AB (ref 0.61–1.24)
CREATININE: 4.54 mg/dL — AB (ref 0.61–1.24)
Calcium: 8.1 mg/dL — ABNORMAL LOW (ref 8.9–10.3)
Calcium: 8.2 mg/dL — ABNORMAL LOW (ref 8.9–10.3)
Chloride: 96 mmol/L — ABNORMAL LOW (ref 101–111)
GFR calc Af Amer: 20 mL/min — ABNORMAL LOW (ref >60)
GFR calc non Af Amer: 13 mL/min — ABNORMAL LOW (ref >60)
GFR, EST AFRICAN AMERICAN: 16 mL/min — AB (ref >60)
GFR, EST NON AFRICAN AMERICAN: 17 mL/min — AB (ref >60)
Glucose, Bld: 103 mg/dL — ABNORMAL HIGH (ref 65–99)
Glucose, Bld: 134 mg/dL — ABNORMAL HIGH (ref 65–99)
POTASSIUM: 3.5 mmol/L (ref 3.5–5.1)
POTASSIUM: 3.8 mmol/L (ref 3.5–5.1)
Phosphorus: 4.1 mg/dL (ref 2.5–4.6)
Phosphorus: 4.2 mg/dL (ref 2.5–4.6)
SODIUM: 132 mmol/L — AB (ref 135–145)
Sodium: 133 mmol/L — ABNORMAL LOW (ref 135–145)

## 2017-05-10 LAB — HEPARIN LEVEL (UNFRACTIONATED)
HEPARIN UNFRACTIONATED: 0.48 [IU]/mL (ref 0.30–0.70)
HEPARIN UNFRACTIONATED: 0.26 [IU]/mL — AB (ref 0.30–0.70)

## 2017-05-10 LAB — LACTATE DEHYDROGENASE: LDH: 263 U/L — AB (ref 98–192)

## 2017-05-10 LAB — MAGNESIUM: MAGNESIUM: 1.9 mg/dL (ref 1.7–2.4)

## 2017-05-10 MED ORDER — BOOST / RESOURCE BREEZE PO LIQD CUSTOM
1.0000 | Freq: Two times a day (BID) | ORAL | Status: DC
  Administered 2017-05-10 – 2017-05-11 (×2): 1 via ORAL
  Administered 2017-05-12: 237 mL via ORAL
  Administered 2017-05-12 – 2017-05-14 (×4): 1 via ORAL
  Administered 2017-05-15: 237 mL via ORAL
  Administered 2017-05-15 – 2017-05-16 (×2): 1 via ORAL
  Administered 2017-05-16: 237 mL via ORAL
  Administered 2017-05-17 – 2017-05-26 (×16): 1 via ORAL

## 2017-05-10 MED ORDER — WARFARIN SODIUM 2.5 MG PO TABS
2.5000 mg | ORAL_TABLET | Freq: Once | ORAL | Status: AC
  Administered 2017-05-10: 2.5 mg via ORAL
  Filled 2017-05-10: qty 1

## 2017-05-10 NOTE — Progress Notes (Signed)
Patient ID: David [name], male   DOB: 01/16/1966, 52 y.o.   MRN: [id number]   Advanced Heart Failure VAD Team Note  Subjective:    David [name] is a 52 y.o. male with a PMH of ICM ('09 PCI to LAD, mRCA, '10 PCI to Lcx and '12 mRCA), HFrEF (EF ~20% for >5 years), HLD and HTN admitted 12/26 for VAD placement due to end-stage systolic HF.    - Underwent successful HM-3 LVAD placement 04/20/17. 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  - Developed AKI. CVVHD started 12/30 - ReIntubated 04/23/17  - Developed progressive shock with uptitration of norepinephrine and epinephrine and addition of vasopressin.  Milrinone stopped and dobutamine begun.  CXR with CHF.   - Re-extubated 04/26/2017 - Started on Meropenem 04/27/16 with + Achromobacter in tracheal aspirate. (Finished 05/07/17) - CVVHD stopped 1/10 - 1st iHD 1/12 - Stitch placed in bleeding driveline from over weekend 05/08/17 - Tunneled HD cath placed 05/08/17  PICC pulled 05/09/17. No Coox, No CVP.  Weight stable. Got iHD yesterday.  Feeling OK overall this am. Denies SOB or lightheadedness walking halls. Anxious about moving out of ICU. Says "Maybe next week.". No further bleeding at driveline site. Made close to 700cc yesterday.   Hgb remains low but stable at 7.8.  LDH 355 => 319 => 295 => 315 => 265 => 263 INR 1.6 => 1.8 => 1.95 => 1.43 => 1.49 => 1.63  Echo 12/31 reviewed: No significant pericardial effusion, IV septum midline to slightly to the right, RV function adequate.  LVAD INTERROGATION:  HeartMate III LVAD:  Flow 4.8 liters/min, speed 5400, power 4.0, PI 3.6, 6 PI events.    Objective:    Vital Signs:   Temp:  [97.9 F (36.6 C)-99 F (37.2 C)] 98.7 F (37.1 C) (01/16 0400) Pulse Rate:  [64-149] 149 (01/16 0500) Resp:  [8-33] 12 (01/16 0500) BP: (74-103)/(58-88) 86/68 (01/16 0500) SpO2:  [90 %-100 %] 93 % (01/16 0500) Weight:  [167 lb 8.8 oz (76 kg)-167 lb 15.9 oz (76.2 kg)] 167 lb 8.8 oz (76 kg) (01/16  0500) Last BM Date: 05/05/17 Mean arterial Pressure 70-80s  Intake/Output:   Intake/Output Summary (Last 24 hours) at 05/10/2017 0753 Last data filed at 05/10/2017 1779 Gross per 24 hour  Intake 1157.54 ml  Output 2500 ml  Net -1342.46 ml     Physical Exam   Physical Exam:  GENERAL: Fatigued appearing this am. NAD.  HEENT: Normal. NECK: Supple, JVP 9-10 cm. Carotids OK.  CARDIAC:  Mechanical heart sounds with LVAD hum present.  R chest perm cath LUNGS:  CTAB, normal effort.  ABDOMEN:  NT, ND, no HSM. No bruits or masses. +BS  LVAD exit site: Dressing dry and intact. No erythema or drainage. Stabilization device present and accurately applied. Driveline dressing changed daily per sterile technique. EXTREMITIES:  Warm and dry. No cyanosis, clubbing, rash, or edema.  NEUROLOGIC:  Alert & oriented x 3. Cranial nerves grossly intact. Moves all 4 extremities w/o difficulty. Affect pleasant     Telemetry   NSR 80s, personally reviewed.   Labs   Basic Metabolic Panel: Recent Labs  Lab 05/06/17 0236  05/07/17 0456  05/08/17 0530 05/08/17 1552 05/09/17 0224 05/09/17 1605 05/10/17 0032  NA 122*   < > 129*   < > 130* 129* 130* 131* 133*  K 4.6   < > 4.1   < > 4.3 4.2 4.3 3.5 3.8  CL 94*   < > 96*   < >  94* 94* 95* 95* 96*  CO2 21*   < > 23   < > 25 23 24 26 27   GLUCOSE 83   < > 86   < > 92 109* 129* 131* 103*  BUN 25*   < > 16   < > 21* 26* 29* 10 12  CREATININE 4.63*   < > 3.81*   < > 4.97* 5.40* 5.73* 3.22* 3.68*  CALCIUM 8.1*   < > 8.1*   < > 8.6* 8.3* 8.4* 7.9* 8.1*  MG 2.3  --  1.9  --  2.2  --  2.1  --  1.9  PHOS 5.8*   < >  --    < > 5.7* 6.1* 6.5* 3.2 4.1   < > = values in this interval not displayed.    Liver Function Tests: Recent Labs  Lab 05/07/17 0456  05/08/17 0530 05/08/17 1552 05/09/17 0224 05/09/17 1605 05/10/17 0032  AST 45*  --   --   --   --   --   --   ALT 32  --   --   --   --   --   --   ALKPHOS 261*  --   --   --   --   --   --   BILITOT  3.2*  --   --   --   --   --   --   PROT 7.0  --   --   --   --   --   --   ALBUMIN 1.7*   < > 1.8* 1.7* 1.7* 1.6* 1.7*   < > = values in this interval not displayed.   No results for input(s): LIPASE, AMYLASE in the last 168 hours. No results for input(s): AMMONIA in the last 168 hours.  CBC: Recent Labs  Lab 05/07/17 0456 05/07/17 1530 05/08/17 0530 05/09/17 0350 05/10/17 0032  WBC 13.4* 12.7* 10.9* 10.6* 9.8  HGB 7.5* 7.7* 8.9* 7.8* 7.8*  HCT 23.3* 24.4* 27.7* 24.9* 24.6*  MCV 82.6 83.3 84.7 83.6 84.2  PLT 358 377 360 324 306    INR: Recent Labs  Lab 05/07/17 0456 05/07/17 1530 05/08/17 0530 05/09/17 0224 05/10/17 0032  INR 1.95 1.68 1.43 1.49 1.63    Other results:  Imaging   Ir Fluoro Guide Cv Line Right  Result Date: 05/08/2017 CLINICAL DATA:  Heart failure with LVAD. Renal insufficiency. Indwelling right subclavian temporary hemodialysis catheter. Needs long-term access for hemodialysis. EXAM: TUNNELED HEMODIALYSIS CATHETER PLACEMENT WITH ULTRASOUND AND FLUOROSCOPIC GUIDANCE TECHNIQUE: The procedure, risks, benefits, and alternatives were explained to the patient. Questions regarding the procedure were encouraged and answered. The patient understands and consents to the procedure. As antibiotic prophylaxis, cefazolin 2 g was ordered pre-procedure and administered intravenously within one hour of incision.Patency of the right IJ vein was confirmed with ultrasound with image documentation. An appropriate skin site was determined. Region was prepped using maximum barrier technique including cap and mask, sterile gown, sterile gloves, large sterile sheet, and Chlorhexidine as cutaneous antisepsis. The region was infiltrated locally with 1% lidocaine. Intravenous Fentanyl and Versed were administered as conscious sedation during continuous monitoring of the patient's level of consciousness and physiological / cardiorespiratory status by the radiology RN, with a total  moderate sedation time of 10 minutes. Under real-time ultrasound guidance, the right IJ vein was accessed with a 21 gauge micropuncture needle; the needle tip within the vein was confirmed with ultrasound image documentation. Needle exchanged  over the 018 guidewire for transitional dilator, which allowed advancement of a Benson wire into the IVC. Over this, an MPA catheter was advanced. A Palindrome 23 hemodialysis catheter was tunneled from the right anterior chest wall approach to the right IJ dermatotomy site. The MPA catheter was exchanged over an Amplatz wire for serial vascular dilators which allow placement of a peel-away sheath, through which the catheter was advanced under intermittent fluoroscopy, positioned with its tips in the proximal and midright atrium. Spot chest radiograph confirms good catheter position. No pneumothorax. Catheter was flushed and primed per protocol. Catheter secured externally with O Prolene sutures. The right IJ dermatotomy site was closed with Dermabond. COMPLICATIONS: COMPLICATIONS None immediate FLUOROSCOPY TIME:  30 seconds; 4 mGy COMPARISON:  None IMPRESSION: 1. Technically successful placement of tunneled right IJ hemodialysis catheter with ultrasound and fluoroscopic guidance. Ready for routine use. ACCESS: Remains approachable for percutaneous intervention as needed. Electronically Signed   By: Lucrezia Europe M.D.   On: 05/08/2017 11:28   Ir US Guide Vasc Access Right  Result Date: 05/08/2017 CLINICAL DATA:  Heart failure with LVAD. Renal insufficiency. Indwelling right subclavian temporary hemodialysis catheter. Needs long-term access for hemodialysis. EXAM: TUNNELED HEMODIALYSIS CATHETER PLACEMENT WITH ULTRASOUND AND FLUOROSCOPIC GUIDANCE TECHNIQUE: The procedure, risks, benefits, and alternatives were explained to the patient. Questions regarding the procedure were encouraged and answered. The patient understands and consents to the procedure. As antibiotic prophylaxis,  cefazolin 2 g was ordered pre-procedure and administered intravenously within one hour of incision.Patency of the right IJ vein was confirmed with ultrasound with image documentation. An appropriate skin site was determined. Region was prepped using maximum barrier technique including cap and mask, sterile gown, sterile gloves, large sterile sheet, and Chlorhexidine as cutaneous antisepsis. The region was infiltrated locally with 1% lidocaine. Intravenous Fentanyl and Versed were administered as conscious sedation during continuous monitoring of the patient's level of consciousness and physiological / cardiorespiratory status by the radiology RN, with a total moderate sedation time of 10 minutes. Under real-time ultrasound guidance, the right IJ vein was accessed with a 21 gauge micropuncture needle; the needle tip within the vein was confirmed with ultrasound image documentation. Needle exchanged over the 018 guidewire for transitional dilator, which allowed advancement of a Benson wire into the IVC. Over this, an MPA catheter was advanced. A Palindrome 23 hemodialysis catheter was tunneled from the right anterior chest wall approach to the right IJ dermatotomy site. The MPA catheter was exchanged over an Amplatz wire for serial vascular dilators which allow placement of a peel-away sheath, through which the catheter was advanced under intermittent fluoroscopy, positioned with its tips in the proximal and midright atrium. Spot chest radiograph confirms good catheter position. No pneumothorax. Catheter was flushed and primed per protocol. Catheter secured externally with O Prolene sutures. The right IJ dermatotomy site was closed with Dermabond. COMPLICATIONS: COMPLICATIONS None immediate FLUOROSCOPY TIME:  30 seconds; 4 mGy COMPARISON:  None IMPRESSION: 1. Technically successful placement of tunneled right IJ hemodialysis catheter with ultrasound and fluoroscopic guidance. Ready for routine use. ACCESS: Remains  approachable for percutaneous intervention as needed. Electronically Signed   By: Lucrezia Europe M.D.   On: 05/08/2017 11:28     Medications:     Scheduled Medications: . aspirin EC  81 mg Oral Daily  . atorvastatin  40 mg Oral q1800  . chlorhexidine  15 mL Mouth Rinse BID  . Chlorhexidine Gluconate Cloth  6 each Topical Daily  . citalopram  10 mg Oral Daily  .  feeding supplement (ENSURE ENLIVE)  237 mL Oral TID BM  . ferrous [id number] C-folic acid  1 capsule Oral BID BM  . gabapentin  100 mg Oral BID  . mouth rinse  15 mL Mouth Rinse q12n4p  . pantoprazole  40 mg Oral Daily  . Warfarin - Pharmacist Dosing Inpatient   Does not apply q1800    Infusions: . sodium chloride Stopped (04/30/17 0800)  . heparin 1,450 Units/hr (05/10/17 7591)    PRN Medications: levalbuterol, ondansetron (ZOFRAN) IV, oxyCODONE, simethicone, sodium chloride flush, sodium chloride flush   Patient Profile   David [name] is a 52 y.o. male with a PMH of ICM ('09 PCI to LAD, mRCA, '10 PCI to Lcx and '12 mRCA), HFrEF (EF ~20% for >5 years) s/p HM-III VAD placement on 12/27 due to end-stage systolic HF.   Assessment/Plan:    1. Acute on chronic systolic HF: Echo 63/84/66 with LVEF 20-25%, Mild MR, Mild/Mod MR, Severe LAE, Mildly reduced RV, PA peak pressure 56 mm Hg.  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 Extubated, initially stopped epinephrine/norepinephrine but restarted with fall in UOP and MAP.  With progressive renal failure, pulmonary edema, and hypotension, he was re-intubated on 12/30.   CVVH was begun.  12/31 was difficult day with refractory shock (MAP in 60s).  Reviewed 12/31 echo which showed no significant pericardial effusion, adequate RV function, and IV septum near mid-line.  Off NE/Epi.  - Off dobutamine. Central line pulled.  - Had 500 cc UOP yesterday.  - MAPs stable in 59s. - Continue aspirin 81 mg daily.  - Warfarin goal INR 2-2.5. INR 1.6.  Heparin resumed 05/09/17.  2. CAD s/p PCI to mid RCA and PLOM with DES x 2 and DES to mid LAD in 1/18.   - Continue ASA and statin. No change 3. PAF: - Remains in NSR. Personally reviewed..  4. ARF on CKD stage III: Suspect some degree of intrinsic renal disease prior to LVAD placement (baseline creatinine around 1.5) with probable ATN from intra-op/peri-op hypotension and development of vasodilatory/septic shock.  CVVHD stopped 05/04/2017.  - Had 500 cc of UOP yesterday. Seems to be slightly increased.   - First iHD 05/06/17 without problems.  - Tunneled HD cath placed 05/08/17.  5. Anemia: s/p 1 u PRBCs 12/30, 04/25/17, 1/8.  - Hgb 7.8 remains low but stable. No further active bleeding. Trying to limit transfusion with possibility of transplant down the road. Follow closely.  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 ID: Started on cefepime empirically with septic/vasodilatory shock, PCT was 31 04/25/17.  ABX broadened to Meropenem 04/27/16 with + Achromobacter on tracheal aspirate.  - Finished 05/07/17 (10 days)  PCT 8.31 04/27/17 -> 3.87 ->0.94 - WBC lower at 9.8. 7. Elevated LFTs due to shock liver - LFTs nearly normal. No change. 8. Hyponatremia:  - Stable.  9. Driveline bleeding - Resolved after stitch placed by Dr. Cyndia Bent. Plan to remove stitch today to reduce infection risk. - Coumadin resumed 05/08/17 - Heparin back on.   Length of Stay: [address]  Annamaria Helling 05/10/2017, 7:53 AM  VAD Team --- VAD ISSUES ONLY--- Pager [phone] (7am - 7am)  Advanced Heart Failure Team  Pager [phone] (M-F; 7a - 4p)  Please contact [facility] for night-coverage after hours (4p -7a ) and weekends on [email or web address]  Patient seen and examined with the above-signed Advanced Practice Provider and/or Housestaff. I personally reviewed laboratory data, imaging  studies and relevant notes. I independently examined the patient and formulated the important aspects of the plan. I have edited  the note to reflect any of my changes or salient points. I have personally discussed the plan with the patient and/or family.  Now off dobutamine. Co-ox stable. MAP stable. Had iHD yesterday. Made ~ 700cc urine. High-dose lasix given again this am. Await response. Hopefully getting some renal recovery. D/w Renal will assess need for HD on daily basis. Bleeding at driveline site has stopped. Remains on heparin/warfarin. Discussed with PharmD. VAD interrogated personally. Parameters stable.  Ambulate. Use IS. Can go to [facility]   Glori Bickers, MD  10:07 PM

## 2017-05-10 NOTE — Progress Notes (Signed)
Magda Paganini from [place] team here to change driveline dressing and remove suture per Dr. Cyndia Bent

## 2017-05-10 NOTE — Progress Notes (Addendum)
Physical Therapy Treatment Patient Details Name: David [name] MRN: [id number] DOB: [birth year] Today's Date: 05/10/2017    History of Present Illness This 52 y.o. male admitted for LVAD. Pt extubated 12/28.   on 12/30, pt developed respiratory distress with pulmonary edema, and increased creatanine.  He was intubated 12/30 > 04/26/16, and CRRT initiated 12/30 and discontinued 1/10.    PMH includes:  Acute on chronic systolic HF, CAD, PAF, CKD stage III, LBBB, frequent PVCs, seizures, PNS, MI, ischemic cardiomyopathy, s/p AICD     PT Comments    Pt unable to recall sternal precautions during session this am.  Pt's RN set up power sources and changed over to battery with attempted education and the patient uninterested in education reporting, "Yeah I know." Post gait training, patient did participate in transfer back to hardwire but required mod VCs for correct sequencing to complete power source transfer.  Wife not present to educate.  Pt will rely on wife at home as he remains unable to perform power transfers without assistance and he is also non compliant with sternal precautions despite cueing.    Follow Up Recommendations  Supervision/Assistance - 24 hour;Home health PT     Equipment Recommendations  Other (comment)(4 wheeled RW with a seat ( rollator))    Recommendations for Other Services       Precautions / Restrictions Precautions Precautions: Fall;Sternal;Other (comment)(LVAD precautions) Precaution Comments: requires min cues for sternal precautions;  Restrictions Weight Bearing Restrictions: No Other Position/Activity Restrictions: sternal precautions    Mobility  Bed Mobility Overal bed mobility: Needs Assistance Bed Mobility: Supine to Sit;Sit to Supine     Supine to sit: Supervision Sit to supine: Supervision   General bed mobility comments: Pt required cues for safety as he attempts to push through his L arm to return to bed.  Pt required cues to maintain sternal  precautions and be mindful of drive line when mobilizing.    Transfers Overall transfer level: Needs assistance Equipment used: 4-wheeled walker(EVA walker) Transfers: Sit to/from Stand Sit to Stand: Min guard         General transfer comment: Cues for hand placement on knees to maintain sternal precautions.  Pt remains to require cues for safety with lines and leads.  Pt tolerated with max VCs.    Ambulation/Gait Ambulation/Gait assistance: Min guard Ambulation Distance (Feet): 680 Feet Assistive device: 4-wheeled walker(EVA walker) Gait Pattern/deviations: Step-through pattern;Decreased stride length;Trunk flexed Gait velocity: decr Gait velocity interpretation: Below normal speed for age/gender General Gait Details: Cues for upper trunk control and line/lead management.  Pt prefers to use waist strap which does not fit well.     Stairs            Wheelchair Mobility    Modified Rankin (Stroke Patients Only)       Balance Overall balance assessment: Needs assistance Sitting-balance support: Feet supported Sitting balance-Leahy Scale: Fair       Standing balance-Leahy Scale: Fair Standing balance comment: Pt able to stand for short periods of time unassisted when attempting to use urinal.                              Cognition Arousal/Alertness: Awake/alert Behavior During Therapy: WFL for tasks assessed/performed Overall Cognitive Status: Impaired/Different from baseline Area of Impairment: Attention;Memory                   Current Attention Level: Selective Memory: Decreased short-term memory;Decreased recall  of precautions   Safety/Judgement: Decreased awareness of safety;Decreased awareness of deficits     General Comments: Pt continues to be disinterested in learning how to change power sources, he reports his wife will be responsible and requests staff to just " do it, my hands are weak."      Exercises      General  Comments        Pertinent Vitals/Pain Pain Assessment: 0-10 Pain Score: 5 (reports it increased to "Off the chain" post PT treatment.  ) Pain Location: sternal Pain Descriptors / Indicators: Grimacing;Guarding Pain Intervention(s): Monitored during session;Repositioned;Patient requesting pain meds-RN notified(RN gave meds post session.  )    Home Living                      Prior Function            PT Goals (current goals can now be found in the care plan section) Acute Rehab PT Goals Patient Stated Goal: To get stronger and go home  Potential to Achieve Goals: Good Progress towards PT goals: Progressing toward goals    Frequency    Min 3X/week      PT Plan Current plan remains appropriate    Co-evaluation              AM-PAC PT "6 Clicks" Daily Activity  Outcome Measure  Difficulty turning over in bed (including adjusting bedclothes, sheets and blankets)?: A Little Difficulty moving from lying on back to sitting on the side of the bed? : A Little Difficulty sitting down on and standing up from a chair with arms (e.g., wheelchair, bedside commode, etc,.)?: A Little Help needed moving to and from a bed to chair (including a wheelchair)?: A Little Help needed walking in hospital room?: A Little Help needed climbing 3-5 steps with a railing? : A Little 6 Click Score: 18    End of Session Equipment Utilized During Treatment: Oxygen;Other (comment)(LVAD back up bag) Activity Tolerance: Patient tolerated treatment well Patient left: with call bell/phone within reach;in bed Nurse Communication: Mobility status PT Visit Diagnosis: Unsteadiness on feet (R26.81);Muscle weakness (generalized) (M62.81);Difficulty in walking, not elsewhere classified (R26.2)     Time: 1740-8144 PT Time Calculation (min) (ACUTE ONLY): 33 min  Charges:  $Gait Training: 8-22 mins $Therapeutic Activity: 8-22 mins                    G Codes:       [name], PTA pager  [phone]    [name] 05/10/2017, 2:44 PM

## 2017-05-10 NOTE — Progress Notes (Signed)
[place] for Coumadin/Heparin Indication: LVAD  Allergies  Allergen Reactions  . Plavix [Clopidogrel Bisulfate] Hives   Patient Measurements: Height: 5\' 5"  (165.1 cm) Weight: 167 lb 8.8 oz (76 kg) IBW/kg (Calculated) : 61.5  Vital Signs: Temp: 98.4 F (36.9 C) (01/16 0814) Temp Source: Oral (01/16 0814) BP: 102/73 (01/16 1200) Pulse Rate: 82 (01/16 0814)  Labs: Recent Labs    05/08/17 0530  05/09/17 0224 05/09/17 0350 05/09/17 1605 05/10/17 0032 05/10/17 1236  HGB 8.9*  --   --  7.8*  --  7.8* 7.9*  HCT 27.7*  --   --  24.9*  --  24.6* 24.8*  PLT 360  --   --  324  --  306 324  LABPROT 17.3*  --  17.9*  --   --  19.2*  --   INR 1.43  --  1.49  --   --  1.63  --   HEPARINUNFRC  --   --   --   --   --  0.26* 0.48  CREATININE 4.97*   < > 5.73*  --  3.22* 3.68*  --    < > = values in this interval not displayed.   Estimated Creatinine Clearance: 22.1 mL/min (A) (by C-G formula based on SCr of 3.68 mg/dL (H)).  Assessment: 52 yo male s/p LVAD implantation 12/27.   IV heparin and Coumadin on hold for tunneled catheter 1/15.  Also with bleeding from driveline site.  Driveline site bleeding improved today, did not require dressing change overnight.   Heparin resumed 1450 uts/hr  HL 0.48 at goal.  INR 1.63 less bleeding, CBC stable. Starting to eat better and feel stronger.  Goal of Therapy:  Heparin 0.3-0.7 units/ml INR 2-2.5 Monitor platelets by anticoagulation protocol: Yes    Plan: Coumadin 2.5 mg po x1 Daily PT/INR.  IV heparin at rate of 1450 units/hr. Daily heparin level and CBC.  Bonnita Nasuti Pharm.D. CPP, BCPS Clinical Pharmacist [phone] 05/10/2017 1:48 PM

## 2017-05-10 NOTE — Progress Notes (Signed)
Having jerking motions of  Arms at times. Wife  And patient expressing concerns about this and adl's/ Dietitian  In discussing supplements. Giving care in clusters  And having patient make decisions. Lab supposed to come back and draw fractionation heparin. Pharmacy calling them to get labs done. Wife updated throughout visit.  SBP 100-  First sound on manual with do-pler 90.  Has been 10 mm/hg lower than automatic.

## 2017-05-10 NOTE — Progress Notes (Signed)
Nutrition Follow-up  INTERVENTION:   Boost Breeze po BID, each supplement provides 250 kcal and 9 grams of protein  D/C Ensure  Recommend liberalize diet   NUTRITION DIAGNOSIS:   Increased nutrient needs related to (LVAD) as evidenced by estimated needs. Ongoing.   GOAL:   Patient will meet greater than or equal to 90% of their needs Met.   MONITOR:   PO intake, Supplement acceptance, Labs, Weight trends, I & O's  ASSESSMENT:   Pt with PMH of HLD, HTN, end-stage CHF admitted for HM-3 LVAD placement 12/27. Extubated 12/28, pt with hypotension and poor UOP CRRT started 12/30. Pt re-intubated 12/30 with worsening acidosis and respiratory distress. Extubated 12/31.   Pt with AKI on CKD III now off CRRT. Started iHD 1/11 with last HD 1/15. Per nephrology pt may have had some renal recovery. MD monitoring need for further HD. UOP: 750 ml x 24 hr  Spoke with wife and pt. Pt reports good appetite and is eating most of the meals that he receives. He is drinking 2 ensure per day but does not really like them. He really likes fruit punch and is willing to try Boost Breeze.   Per pt/wife he has no teeth but was eating grapes at home and would like to eat them here as well. Discussed with RN who will advance diet since he is eating well.   Per RN pt has been walking in the halls and seems to be making progress.   9 L negative   Diet Order:  Diet Heart Room service appropriate? Yes; Fluid consistency: Thin  EDUCATION NEEDS:   No education needs have been identified at this time  Skin:  Skin Assessment: Skin Integrity Issues: Skin Integrity Issues:: Incisions Incisions: sternum and abdomen  Last BM:  1/11  Height:   Ht Readings from Last 1 Encounters:  05/09/17 5' 5" (1.651 m)    Weight:   Wt Readings from Last 1 Encounters:  05/10/17 167 lb 8.8 oz (76 kg)    Ideal Body Weight:  61.8 kg  BMI:  Body mass index is 27.88 kg/m.  Estimated Nutritional Needs:   Kcal:   2100-2300  Protein:  100-115 grams  Fluid:  >1.5 L/day  Maylon Peppers RD, LDN, CNSC [phone] Pager [phone] After Hours Pager

## 2017-05-10 NOTE — Progress Notes (Signed)
Patient ID: Keyvon Herter, male   DOB: [birth year], 52 y.o.   MRN: [id number] HeartMate 3 Rounding Note  Subjective:    No bleeding around drive line. Co-ox 69 this am Dobutamine off since yesterday am. Had HD yesterday. Central line is out  Had a few hundred cc's of urine in bladder on scan but did not feel like he had to urinate this am.  LVAD INTERROGATION:  HeartMate IIl LVAD:  Flow 4.8 liters/min, speed 5400, power 4, PI 3.4    Objective:    Vital Signs:   Temp:  [97.9 F (36.6 C)-99 F (37.2 C)] 98.4 F (36.9 C) (01/16 0814) Pulse Rate:  [64-149] 82 (01/16 0814) Resp:  [12-33] 23 (01/16 0814) BP: (74-101)/(58-82) 99/76 (01/16 0814) SpO2:  [90 %-100 %] 97 % (01/16 0814) Weight:  [76 kg (167 lb 8.8 oz)-76.2 kg (167 lb 15.9 oz)] 76 kg (167 lb 8.8 oz) (01/16 0500) Last BM Date: 05/05/17 Mean arterial Pressure 70's-80's  Intake/Output:   Intake/Output Summary (Last 24 hours) at 05/10/2017 0954 Last data filed at 05/10/2017 3646 Gross per 24 hour  Intake 1154.14 ml  Output 2500 ml  Net -1345.86 ml     Physical Exam: General:  Chronically ill-appearing but awake and alert.   No resp difficulty Neck: left IJ central line Cor: intermittent heart sounds with LVAD hum present. Lungs: clear Abdomen: soft, nontender, nondistended. Good bowel sounds. Extremities: minimal edema in arms Neuro: alert & orientedx3,  moves all 4 extremities w/o difficulty. Affect pleasant The sternotomy and abdominal incision are healing well.  Telemetry: sinus rhythm 80's  Labs: Basic Metabolic Panel: Recent Labs  Lab 05/06/17 0236  05/07/17 0456  05/08/17 0530 05/08/17 1552 05/09/17 0224 05/09/17 1605 05/10/17 0032  NA 122*   < > 129*   < > 130* 129* 130* 131* 133*  K 4.6   < > 4.1   < > 4.3 4.2 4.3 3.5 3.8  CL 94*   < > 96*   < > 94* 94* 95* 95* 96*  CO2 21*   < > 23   < > 25 23 24 26 27   GLUCOSE 83   < > 86   < > 92 109* 129* 131* 103*  BUN 25*   < > 16   < > 21* 26* 29* 10 12   CREATININE 4.63*   < > 3.81*   < > 4.97* 5.40* 5.73* 3.22* 3.68*  CALCIUM 8.1*   < > 8.1*   < > 8.6* 8.3* 8.4* 7.9* 8.1*  MG 2.3  --  1.9  --  2.2  --  2.1  --  1.9  PHOS 5.8*   < >  --    < > 5.7* 6.1* 6.5* 3.2 4.1   < > = values in this interval not displayed.    Liver Function Tests: Recent Labs  Lab 05/07/17 0456  05/08/17 0530 05/08/17 1552 05/09/17 0224 05/09/17 1605 05/10/17 0032  AST 45*  --   --   --   --   --   --   ALT 32  --   --   --   --   --   --   ALKPHOS 261*  --   --   --   --   --   --   BILITOT 3.2*  --   --   --   --   --   --   PROT 7.0  --   --   --   --   --   --  ALBUMIN 1.7*   < > 1.8* 1.7* 1.7* 1.6* 1.7*   < > = values in this interval not displayed.   No results for input(s): LIPASE, AMYLASE in the last 168 hours. No results for input(s): AMMONIA in the last 168 hours.  CBC: Recent Labs  Lab 05/07/17 0456 05/07/17 1530 05/08/17 0530 05/09/17 0350 05/10/17 0032  WBC 13.4* 12.7* 10.9* 10.6* 9.8  HGB 7.5* 7.7* 8.9* 7.8* 7.8*  HCT 23.3* 24.4* 27.7* 24.9* 24.6*  MCV 82.6 83.3 84.7 83.6 84.2  PLT 358 377 360 324 306    INR: Recent Labs  Lab 05/07/17 0456 05/07/17 1530 05/08/17 0530 05/09/17 0224 05/10/17 0032  INR 1.95 1.68 1.43 1.49 1.63    Other results:  EKG:   Imaging: Ir Fluoro Guide Cv Line Right  Result Date: 05/08/2017 CLINICAL DATA:  Heart failure with LVAD. Renal insufficiency. Indwelling right subclavian temporary hemodialysis catheter. Needs long-term access for hemodialysis. EXAM: TUNNELED HEMODIALYSIS CATHETER PLACEMENT WITH ULTRASOUND AND FLUOROSCOPIC GUIDANCE TECHNIQUE: The procedure, risks, benefits, and alternatives were explained to the patient. Questions regarding the procedure were encouraged and answered. The patient understands and consents to the procedure. As antibiotic prophylaxis, cefazolin 2 g was ordered pre-procedure and administered intravenously within one hour of incision.Patency of the right IJ vein  was confirmed with ultrasound with image documentation. An appropriate skin site was determined. Region was prepped using maximum barrier technique including cap and mask, sterile gown, sterile gloves, large sterile sheet, and Chlorhexidine as cutaneous antisepsis. The region was infiltrated locally with 1% lidocaine. Intravenous Fentanyl and Versed were administered as conscious sedation during continuous monitoring of the patient's level of consciousness and physiological / cardiorespiratory status by the radiology RN, with a total moderate sedation time of 10 minutes. Under real-time ultrasound guidance, the right IJ vein was accessed with a 21 gauge micropuncture needle; the needle tip within the vein was confirmed with ultrasound image documentation. Needle exchanged over the 018 guidewire for transitional dilator, which allowed advancement of a Benson wire into the IVC. Over this, an MPA catheter was advanced. A Palindrome 23 hemodialysis catheter was tunneled from the right anterior chest wall approach to the right IJ dermatotomy site. The MPA catheter was exchanged over an Amplatz wire for serial vascular dilators which allow placement of a peel-away sheath, through which the catheter was advanced under intermittent fluoroscopy, positioned with its tips in the proximal and midright atrium. Spot chest radiograph confirms good catheter position. No pneumothorax. Catheter was flushed and primed per protocol. Catheter secured externally with O Prolene sutures. The right IJ dermatotomy site was closed with Dermabond. COMPLICATIONS: COMPLICATIONS None immediate FLUOROSCOPY TIME:  30 seconds; 4 mGy COMPARISON:  None IMPRESSION: 1. Technically successful placement of tunneled right IJ hemodialysis catheter with ultrasound and fluoroscopic guidance. Ready for routine use. ACCESS: Remains approachable for percutaneous intervention as needed. Electronically Signed   By: Lucrezia Europe M.D.   On: 05/08/2017 11:28   Ir US  Guide Vasc Access Right  Result Date: 05/08/2017 CLINICAL DATA:  Heart failure with LVAD. Renal insufficiency. Indwelling right subclavian temporary hemodialysis catheter. Needs long-term access for hemodialysis. EXAM: TUNNELED HEMODIALYSIS CATHETER PLACEMENT WITH ULTRASOUND AND FLUOROSCOPIC GUIDANCE TECHNIQUE: The procedure, risks, benefits, and alternatives were explained to the patient. Questions regarding the procedure were encouraged and answered. The patient understands and consents to the procedure. As antibiotic prophylaxis, cefazolin 2 g was ordered pre-procedure and administered intravenously within one hour of incision.Patency of the right IJ vein was confirmed with ultrasound  with image documentation. An appropriate skin site was determined. Region was prepped using maximum barrier technique including cap and mask, sterile gown, sterile gloves, large sterile sheet, and Chlorhexidine as cutaneous antisepsis. The region was infiltrated locally with 1% lidocaine. Intravenous Fentanyl and Versed were administered as conscious sedation during continuous monitoring of the patient's level of consciousness and physiological / cardiorespiratory status by the radiology RN, with a total moderate sedation time of 10 minutes. Under real-time ultrasound guidance, the right IJ vein was accessed with a 21 gauge micropuncture needle; the needle tip within the vein was confirmed with ultrasound image documentation. Needle exchanged over the 018 guidewire for transitional dilator, which allowed advancement of a Benson wire into the IVC. Over this, an MPA catheter was advanced. A Palindrome 23 hemodialysis catheter was tunneled from the right anterior chest wall approach to the right IJ dermatotomy site. The MPA catheter was exchanged over an Amplatz wire for serial vascular dilators which allow placement of a peel-away sheath, through which the catheter was advanced under intermittent fluoroscopy, positioned with its  tips in the proximal and midright atrium. Spot chest radiograph confirms good catheter position. No pneumothorax. Catheter was flushed and primed per protocol. Catheter secured externally with O Prolene sutures. The right IJ dermatotomy site was closed with Dermabond. COMPLICATIONS: COMPLICATIONS None immediate FLUOROSCOPY TIME:  30 seconds; 4 mGy COMPARISON:  None IMPRESSION: 1. Technically successful placement of tunneled right IJ hemodialysis catheter with ultrasound and fluoroscopic guidance. Ready for routine use. ACCESS: Remains approachable for percutaneous intervention as needed. Electronically Signed   By: Lucrezia Europe M.D.   On: 05/08/2017 11:28     Medications:     Scheduled Medications: . aspirin EC  81 mg Oral Daily  . atorvastatin  40 mg Oral q1800  . chlorhexidine  15 mL Mouth Rinse BID  . Chlorhexidine Gluconate Cloth  6 each Topical Daily  . citalopram  10 mg Oral Daily  . feeding supplement (ENSURE ENLIVE)  237 mL Oral TID BM  . ferrous [id number] C-folic acid  1 capsule Oral BID BM  . gabapentin  100 mg Oral BID  . mouth rinse  15 mL Mouth Rinse q12n4p  . pantoprazole  40 mg Oral Daily  . Warfarin - Pharmacist Dosing Inpatient   Does not apply q1800    Infusions: . sodium chloride Stopped (04/30/17 0800)  . heparin 1,450 Units/hr (05/10/17 0623)    PRN Medications: levalbuterol, ondansetron (ZOFRAN) IV, oxyCODONE, simethicone, sodium chloride flush, sodium chloride flush   Assessment:   1. Ischemic cardiomyopathy s/p multiple PCI's with chronic systolic heart failure and EF 20% on home dobutamine preop with significant improvement in symptoms. Evaluated at [facility] for heart transplant but LVAD felt to be the best treatment for him at this time. POD21s/p HeartMate III LVAD. He was critically ill with multi-system organ failure but has continued to recover and only issue now is renal failure requiring HD. Hemodynamics stable off  dobutamine 2.H/OHypertension 3.H/OHyperlipidemia 4. PAF in sinuspreopon amiodarone and Eliquis.Now back in sinus off amiodarone.  5.H/OStage III CKD. Acute on chronic postop renal failureon intermittent HD. Making some urine. Still hopeful for recovery.  6. LBBB s/p CRT-D upgrade in 10/2016 without improvement in symptoms or EF. LV epicardial leads cut off at the time of surgery. 7. Prior smoking with mild obstruction and severe diffusion defect on PFT's. CXRhas been stable. Continue IS. 8. Postop leukocytosis and fever. WBC normal.   9. Marked rise in bilirubin and transaminases. Likely due to  pump runand right sided congestion, hypotension.Resolving. 10. INR is1.63 today. Coumadin per pharmacy. Started heparin drip since no bleeding around drive line.    11. Severe malnutrition with albumin 1.6. Taking PO fairly well. Continue to encourage nutrition.    12. PT    13. Anemia: Hgb improved after transfusion last night.      14. Jerking movements of unclear etiology but I wonder if related to Celexa or Neurontin, especially with renal failure.  Plan/Discussion:    Overall he has been stable. MAP in good range off pressors.  VAD flow has been great.  Intermittent HD. Bleeding around the exit site is due to raw tissue that is slow to heal. Remove pursestring suture today to minimize the chance of necrosis around the drive line. May need an oral antibiotic with redness.  Will continue coumadin and heparin if no bleeding.  Continue to mobilize and encourage nutrition.  I reviewed the LVAD parameters from today, and compared the results to the patient's prior recorded data.  No programming changes were made.  The LVAD is functioning within specified parameters.   LVAD interrogation was negative for any significant power changes, alarms or PI events/speed drops.  LVAD equipment check completed and is in good working order.  Back-up equipment present.   LVAD education done on emergency  procedures and precautions and reviewed exit site care.  Length of Stay: [address]  Fernande Boyden [facility] 05/10/2017, 9:54 AM

## 2017-05-10 NOTE — Progress Notes (Signed)
Spoke to Dr. Zoila Shutter about general update on patient, and to discuss tremor/ jerking motions that he is exhibiting and concerned about.

## 2017-05-10 NOTE — Plan of Care (Signed)
Progressing to discharge. Ambulating in halls with assistance. Does rely on others to do what he could do. Used to wife assisting him at home. Tires easily. Plans made with patient to have him control times of activities. He understands he must do certain things in order to get well, but does not like people telling him what to do.  Provided clustered care  with rest  periods per patients wishes. Spoke to wife on phone ad updated her . Weaning down o2 , as he did not have it at home prior.

## 2017-05-10 NOTE — Progress Notes (Signed)
CSW met with patient and wife at bedside. Patient in good spirits today and had just finished lunch. Patient reports he walked the halls 2x today and pleased with progress. Wife shared medicaid follow up and case is still pending medicaid bills from current hospitalization. Patient appears to be making progress with recovery and wife appears to be coping  with lengthy hospitalization. CSW will continue to be available as needed throughout implant hospitalization. Raquel Sarna, [place], [place]

## 2017-05-10 NOTE — Progress Notes (Signed)
[place] for Coumadin/Heparin- resume Indication: LVAD  Allergies  Allergen Reactions  . Plavix [Clopidogrel Bisulfate] Hives   Patient Measurements: Height: 5\' 5"  (165.1 cm) Weight: 167 lb 15.9 oz (76.2 kg) IBW/kg (Calculated) : 61.5  Vital Signs: Temp: 98.7 F (37.1 C) (01/16 0003) Temp Source: Oral (01/16 0003) BP: 100/77 (01/16 0000) Pulse Rate: 83 (01/16 0000)  Labs: Recent Labs    05/07/17 0456  05/08/17 0530  05/09/17 0224 05/09/17 0350 05/09/17 1605 05/10/17 0032  HGB 7.5*   < > 8.9*  --   --  7.8*  --  7.8*  HCT 23.3*   < > 27.7*  --   --  24.9*  --  24.6*  PLT 358   < > 360  --   --  324  --  306  LABPROT 22.1*   < > 17.3*  --  17.9*  --   --  19.2*  INR 1.95   < > 1.43  --  1.49  --   --  1.63  HEPARINUNFRC 0.36  --   --   --   --   --   --  0.26*  CREATININE 3.81*   < > 4.97*   < > 5.73*  --  3.22* 3.68*   < > = values in this interval not displayed.   Estimated Creatinine Clearance: 22.1 mL/min (A) (by C-G formula based on SCr of 3.68 mg/dL (H)).  Assessment: 52 yo male s/p LVAD implantation 12/27.   IV heparin and Coumadin on hold for tunneled catheter yesterday.  Also with bleeding from driveline site.  Coumadin restarted last night, heparin remains on hold.  Driveline site bleeding improved today, did not require dressing change overnight.    Initial heparin level subtherapeutic: 0.26, previously therapeutic on 1500 units/hr, Hgb remains low, no bleeding reported by night shift RN  Goal of Therapy:  Heparin 0.3-0.7 units/ml INR 2-2.5 Monitor platelets by anticoagulation protocol: Yes   Plan: Increase heparin gtt to 1450 units/hr Check heparin level in 8 hrs. Daily heparin level and CBC.  Georga Bora, PharmD Clinical Pharmacist 05/10/2017 1:25 AM

## 2017-05-10 NOTE — Progress Notes (Signed)
LVAD Coordinator Rounding Note:  Admitted12/26due to decompensated heart failure.  Heartmate IIILVAD [facility].AV valve oversewn due to aortic insufficiency.  No bleeding from drive line.   Vital signs: Tmax: 98.5 HR:87 Doppler Pressure: 87 Automatic cuff:  99/76 O2 Sat:95 % on 2LNC YO:378>588>502>774>128>786>767>209>470>962>836>629>476>546TKP  LVAD interrogation reveals:  Speed:5300 Flow: 5.2 Power:4.0 w PI:3.3 Alarms:none Events:none Hematocrit:25 Fixed speed:5300 Low speed limit:5000  Labs:  LDH trend:205>407>576>923>967>612>528>492>467>489>441>434>405>376>355>315>265>263  INR trend:1.34>1.52>1.73>1.95>2.32>3.03>3.80>3.16>2.39>2.23>1.70>1.65>1.91>1.60>1.43>1.49>1.63  Anticoagulation Plan: -INR Goal:2-2.5 -ASA Dose: 81 mg   Blood Products: Intra op:2 units/FFP 04/20/17  Post op: PRBC's 1 unit 12/29 1 unit 12/30 1 unit 04/25/17 1 unit 05/02/17 1 unit 05/08/17  Device:Medtronic BiV -Therapies:off  Arrhythmia: 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 (on amiodarone and Eliquis for this) - Afib 12/29 - 04/28/17 - NSR 1/5 - present  Nitric Oxide: -  stopped on 12/30 due to initiation of BiPap  Respiratory: - Extubated 12/28 - Reintubated 04/23/2017 - Extubated 04/26/2017  Renal:  04/23/17 - CRRT started for AKI - stopped 05/05/17  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 VAD coordinator will change dressing this afternoon with his wife.   2. Please call VAD Pager for any equipment concerns or drive line dressing questions/concerns.  [name] RN, VAD Coordinator 24/7 pager [phone]

## 2017-05-10 NOTE — Progress Notes (Signed)
Exit site care performed:  Drive line exit wound care. Existing VAD dressing removed and site care performed using sterile technique.Gauze and silver have a small amount of old bloody drainage. Drive line exit site cleaned with Chlora prep applicators x 2, allowed to dry, andgauzedressing withsilver stripre-applied. Exit sitehealingand unincorporated, the velour is fully implanted at exit site. No redness, tenderness, foul odor or rash noted.    Dr Cyndia Bent updated on current drive line site. VAD Coordinators will change again tomorrow.  [name] RN, VAD Coordinator 24/7 pager [phone]

## 2017-05-10 NOTE — Progress Notes (Signed)
[facility] Progress Note    Assessment/ Plan:   1.  AKI on CKD III: likely due to ATN from shock s/p LVAD placement with underlying arterionephrosclerosis.  Required CRRT initially and now has been transitioned to IHD (1/11).  Last IHD 1/15.  S/p TDC with IR 05/08/17.  UOP increasing, maybe showing signs of nascent recovery?  Re-dosing high dose Lasix and metolazone today.  Will follow UOP and Cr trend and make daily determination of need for HD.  2.  Acute on chronic systolic CHF: s/p Heartmate III 12/27.  Advanced HF and cardiothoracic surgery following.  He required a stitch for driveline bleeding 9/93.  Coumadin has been restarted (1/14)   3.  Elevated LFTs: shock liver, nearly resolved.  4.  Hyponatremia: vol related,  Improving.  5.  Achromobacter pneumonia: s/p antibiotics (ended 1/13, 10 days)  Subjective:    500 mL UOP yesterday.  Pt sitting in chair today, feels well.     Objective:   BP 99/76 (BP Location: Left Arm)   Pulse 82   Temp 98.4 F (36.9 C) (Oral)   Resp (!) 23   Ht 5\' 5"  (1.651 m)   Wt 76 kg (167 lb 8.8 oz)   SpO2 97%   BMI 27.88 kg/m   Intake/Output Summary (Last 24 hours) at 05/10/2017 0956 Last data filed at 05/10/2017 7169 Gross per 24 hour  Intake 1154.14 ml  Output 2500 ml  Net -1345.86 ml   Weight change: -0.911 kg (-0.2 oz)  Physical Exam: CVE:LFYBO gentleman, NAD, sitting in chair about to eat breakfast HEENT: some JVD upright CVS: mechanical hum of LVAD Resp: normal WOB today, will wearing O2 Abd: + drive line LUQ  FBP:1+ LE edema  Imaging: Ir Fluoro Guide Cv Line Right  Result Date: 05/08/2017 CLINICAL DATA:  Heart failure with LVAD. Renal insufficiency. Indwelling right subclavian temporary hemodialysis catheter. Needs long-term access for hemodialysis. EXAM: TUNNELED HEMODIALYSIS CATHETER PLACEMENT WITH ULTRASOUND AND FLUOROSCOPIC GUIDANCE TECHNIQUE: The procedure, risks, benefits, and alternatives were explained to  the patient. Questions regarding the procedure were encouraged and answered. The patient understands and consents to the procedure. As antibiotic prophylaxis, cefazolin 2 g was ordered pre-procedure and administered intravenously within one hour of incision.Patency of the right IJ vein was confirmed with ultrasound with image documentation. An appropriate skin site was determined. Region was prepped using maximum barrier technique including cap and mask, sterile gown, sterile gloves, large sterile sheet, and Chlorhexidine as cutaneous antisepsis. The region was infiltrated locally with 1% lidocaine. Intravenous Fentanyl and Versed were administered as conscious sedation during continuous monitoring of the patient's level of consciousness and physiological / cardiorespiratory status by the radiology RN, with a total moderate sedation time of 10 minutes. Under real-time ultrasound guidance, the right IJ vein was accessed with a 21 gauge micropuncture needle; the needle tip within the vein was confirmed with ultrasound image documentation. Needle exchanged over the 018 guidewire for transitional dilator, which allowed advancement of a Benson wire into the IVC. Over this, an MPA catheter was advanced. A Palindrome 23 hemodialysis catheter was tunneled from the right anterior chest wall approach to the right IJ dermatotomy site. The MPA catheter was exchanged over an Amplatz wire for serial vascular dilators which allow placement of a peel-away sheath, through which the catheter was advanced under intermittent fluoroscopy, positioned with its tips in the proximal and midright atrium. Spot chest radiograph confirms good catheter position. No pneumothorax. Catheter was flushed and primed per protocol. Catheter  secured externally with O Prolene sutures. The right IJ dermatotomy site was closed with Dermabond. COMPLICATIONS: COMPLICATIONS None immediate FLUOROSCOPY TIME:  30 seconds; 4 mGy COMPARISON:  None IMPRESSION: 1.  Technically successful placement of tunneled right IJ hemodialysis catheter with ultrasound and fluoroscopic guidance. Ready for routine use. ACCESS: Remains approachable for percutaneous intervention as needed. Electronically Signed   By: Lucrezia Europe M.D.   On: 05/08/2017 11:28   Ir US Guide Vasc Access Right  Result Date: 05/08/2017 CLINICAL DATA:  Heart failure with LVAD. Renal insufficiency. Indwelling right subclavian temporary hemodialysis catheter. Needs long-term access for hemodialysis. EXAM: TUNNELED HEMODIALYSIS CATHETER PLACEMENT WITH ULTRASOUND AND FLUOROSCOPIC GUIDANCE TECHNIQUE: The procedure, risks, benefits, and alternatives were explained to the patient. Questions regarding the procedure were encouraged and answered. The patient understands and consents to the procedure. As antibiotic prophylaxis, cefazolin 2 g was ordered pre-procedure and administered intravenously within one hour of incision.Patency of the right IJ vein was confirmed with ultrasound with image documentation. An appropriate skin site was determined. Region was prepped using maximum barrier technique including cap and mask, sterile gown, sterile gloves, large sterile sheet, and Chlorhexidine as cutaneous antisepsis. The region was infiltrated locally with 1% lidocaine. Intravenous Fentanyl and Versed were administered as conscious sedation during continuous monitoring of the patient's level of consciousness and physiological / cardiorespiratory status by the radiology RN, with a total moderate sedation time of 10 minutes. Under real-time ultrasound guidance, the right IJ vein was accessed with a 21 gauge micropuncture needle; the needle tip within the vein was confirmed with ultrasound image documentation. Needle exchanged over the 018 guidewire for transitional dilator, which allowed advancement of a Benson wire into the IVC. Over this, an MPA catheter was advanced. A Palindrome 23 hemodialysis catheter was tunneled from the  right anterior chest wall approach to the right IJ dermatotomy site. The MPA catheter was exchanged over an Amplatz wire for serial vascular dilators which allow placement of a peel-away sheath, through which the catheter was advanced under intermittent fluoroscopy, positioned with its tips in the proximal and midright atrium. Spot chest radiograph confirms good catheter position. No pneumothorax. Catheter was flushed and primed per protocol. Catheter secured externally with O Prolene sutures. The right IJ dermatotomy site was closed with Dermabond. COMPLICATIONS: COMPLICATIONS None immediate FLUOROSCOPY TIME:  30 seconds; 4 mGy COMPARISON:  None IMPRESSION: 1. Technically successful placement of tunneled right IJ hemodialysis catheter with ultrasound and fluoroscopic guidance. Ready for routine use. ACCESS: Remains approachable for percutaneous intervention as needed. Electronically Signed   By: Lucrezia Europe M.D.   On: 05/08/2017 11:28    Labs: BMET Recent Labs  Lab 05/06/17 1732 05/07/17 0456 05/07/17 1530 05/08/17 0530 05/08/17 1552 05/09/17 0224 05/09/17 1605 05/10/17 0032  NA 132* 129* 130* 130* 129* 130* 131* 133*  K 3.8 4.1 4.6 4.3 4.2 4.3 3.5 3.8  CL 97* 96* 96* 94* 94* 95* 95* 96*  CO2 25 23 24 25 23 24 26 27   GLUCOSE 80 86 92 92 109* 129* 131* 103*  BUN 11 16 19  21* 26* 29* 10 12  CREATININE 2.84* 3.81* 4.36* 4.97* 5.40* 5.73* 3.22* 3.68*  CALCIUM 8.1* 8.1* 8.3* 8.6* 8.3* 8.4* 7.9* 8.1*  PHOS 3.9  --  5.1* 5.7* 6.1* 6.5* 3.2 4.1   CBC Recent Labs  Lab 05/07/17 1530 05/08/17 0530 05/09/17 0350 05/10/17 0032  WBC 12.7* 10.9* 10.6* 9.8  HGB 7.7* 8.9* 7.8* 7.8*  HCT 24.4* 27.7* 24.9* 24.6*  MCV 83.3 84.7  83.6 84.2  PLT 377 360 324 306    Medications:    . aspirin EC  81 mg Oral Daily  . atorvastatin  40 mg Oral q1800  . chlorhexidine  15 mL Mouth Rinse BID  . Chlorhexidine Gluconate Cloth  6 each Topical Daily  . citalopram  10 mg Oral Daily  . feeding supplement  (ENSURE ENLIVE)  237 mL Oral TID BM  . ferrous [id number] C-folic acid  1 capsule Oral BID BM  . gabapentin  100 mg Oral BID  . mouth rinse  15 mL Mouth Rinse q12n4p  . pantoprazole  40 mg Oral Daily  . Warfarin - Pharmacist Dosing Inpatient   Does not apply [place], MD [place] pgr [phone] 05/10/2017, 9:56 AM

## 2017-05-11 DIAGNOSIS — I5023 Acute on chronic systolic (congestive) heart failure: Secondary | ICD-10-CM

## 2017-05-11 DIAGNOSIS — Z95811 Presence of heart assist device: Secondary | ICD-10-CM

## 2017-05-11 LAB — RENAL FUNCTION PANEL
ALBUMIN: 1.7 g/dL — AB (ref 3.5–5.0)
ANION GAP: 8 (ref 5–15)
Albumin: 1.8 g/dL — ABNORMAL LOW (ref 3.5–5.0)
Anion gap: 11 (ref 5–15)
BUN: 19 mg/dL (ref 6–20)
BUN: 7 mg/dL (ref 6–20)
CHLORIDE: 93 mmol/L — AB (ref 101–111)
CO2: 26 mmol/L (ref 22–32)
CO2: 29 mmol/L (ref 22–32)
CREATININE: 4.94 mg/dL — AB (ref 0.61–1.24)
Calcium: 8.6 mg/dL — ABNORMAL LOW (ref 8.9–10.3)
Calcium: 8.2 mg/dL — ABNORMAL LOW (ref 8.9–10.3)
Chloride: 97 mmol/L — ABNORMAL LOW (ref 101–111)
Creatinine, Ser: 2.87 mg/dL — ABNORMAL HIGH (ref 0.61–1.24)
GFR calc Af Amer: 14 mL/min — ABNORMAL LOW (ref >60)
GFR calc Af Amer: 27 mL/min — ABNORMAL LOW (ref >60)
GFR calc non Af Amer: 12 mL/min — ABNORMAL LOW (ref >60)
GFR calc non Af Amer: 24 mL/min — ABNORMAL LOW (ref >60)
GLUCOSE: 96 mg/dL (ref 65–99)
Glucose, Bld: 100 mg/dL — ABNORMAL HIGH (ref 65–99)
PHOSPHORUS: 3.1 mg/dL (ref 2.5–4.6)
POTASSIUM: 3.6 mmol/L (ref 3.5–5.1)
Phosphorus: 5.9 mg/dL — ABNORMAL HIGH (ref 2.5–4.6)
Potassium: 3.6 mmol/L (ref 3.5–5.1)
Sodium: 130 mmol/L — ABNORMAL LOW (ref 135–145)
Sodium: 134 mmol/L — ABNORMAL LOW (ref 135–145)

## 2017-05-11 LAB — MAGNESIUM: Magnesium: 1.8 mg/dL (ref 1.7–2.4)

## 2017-05-11 LAB — CBC
HEMATOCRIT: 25.6 % — AB (ref 39.0–52.0)
HEMOGLOBIN: 8 g/dL — AB (ref 13.0–17.0)
MCH: 26.4 pg (ref 26.0–34.0)
MCHC: 31.3 g/dL (ref 30.0–36.0)
MCV: 84.5 fL (ref 78.0–100.0)
Platelets: 342 10*3/uL (ref 150–400)
RBC: 3.03 MIL/uL — ABNORMAL LOW (ref 4.22–5.81)
RDW: 19.9 % — ABNORMAL HIGH (ref 11.5–15.5)
WBC: 11.2 10*3/uL — ABNORMAL HIGH (ref 4.0–10.5)

## 2017-05-11 LAB — BPAM RBC
Blood Product Expiration Date: 201901262359
Blood Product Expiration Date: 201902032359
ISSUE DATE / TIME: 201901140052
Unit Type and Rh: 5100
Unit Type and Rh: 5100

## 2017-05-11 LAB — TYPE AND SCREEN
ABO/RH(D): O POS
Antibody Screen: POSITIVE
Donor AG Type: NEGATIVE
Donor AG Type: NEGATIVE
Unit division: 0
Unit division: 0

## 2017-05-11 LAB — GLUCOSE, CAPILLARY
GLUCOSE-CAPILLARY: 107 mg/dL — AB (ref 65–99)
Glucose-Capillary: 99 mg/dL (ref 65–99)

## 2017-05-11 LAB — PROTIME-INR
INR: 1.99
PROTHROMBIN TIME: 22.4 s — AB (ref 11.4–15.2)

## 2017-05-11 LAB — HEPARIN LEVEL (UNFRACTIONATED): Heparin Unfractionated: 0.67 IU/mL (ref 0.30–0.70)

## 2017-05-11 LAB — LACTATE DEHYDROGENASE: LDH: 281 U/L — AB (ref 98–192)

## 2017-05-11 LAB — BRAIN NATRIURETIC PEPTIDE: B NATRIURETIC PEPTIDE 5: 1121.7 pg/mL — AB (ref 0.0–100.0)

## 2017-05-11 MED ORDER — NA FERRIC GLUC CPLX IN SUCROSE 12.5 MG/ML IV SOLN
125.0000 mg | INTRAVENOUS | Status: DC
  Administered 2017-05-11 – 2017-05-18 (×3): 125 mg via INTRAVENOUS
  Filled 2017-05-11 (×11): qty 10

## 2017-05-11 MED ORDER — DARBEPOETIN ALFA 200 MCG/0.4ML IJ SOSY
200.0000 ug | PREFILLED_SYRINGE | INTRAMUSCULAR | Status: DC
  Administered 2017-05-11 – 2017-05-18 (×2): 200 ug via INTRAVENOUS
  Filled 2017-05-11 (×3): qty 0.4

## 2017-05-11 MED ORDER — POLYETHYLENE GLYCOL 3350 17 G PO PACK
17.0000 g | PACK | Freq: Every day | ORAL | Status: DC
  Administered 2017-05-11 – 2017-05-25 (×13): 17 g via ORAL
  Filled 2017-05-11 (×17): qty 1

## 2017-05-11 MED ORDER — FUROSEMIDE 10 MG/ML IJ SOLN
160.0000 mg | Freq: Once | INTRAMUSCULAR | Status: DC
  Filled 2017-05-11: qty 16

## 2017-05-11 MED ORDER — DARBEPOETIN ALFA 200 MCG/0.4ML IJ SOSY
PREFILLED_SYRINGE | INTRAMUSCULAR | Status: AC
  Administered 2017-05-11: 200 ug via INTRAVENOUS
  Filled 2017-05-11: qty 0.4

## 2017-05-11 MED ORDER — CEPHALEXIN 500 MG PO CAPS
500.0000 mg | ORAL_CAPSULE | Freq: Two times a day (BID) | ORAL | Status: DC
  Administered 2017-05-11 – 2017-05-15 (×8): 500 mg via ORAL
  Filled 2017-05-11 (×8): qty 1

## 2017-05-11 MED ORDER — METOLAZONE 5 MG PO TABS
5.0000 mg | ORAL_TABLET | Freq: Once | ORAL | Status: AC
  Administered 2017-05-11: 5 mg via ORAL
  Filled 2017-05-11: qty 1

## 2017-05-11 MED ORDER — WARFARIN SODIUM 2.5 MG PO TABS
2.5000 mg | ORAL_TABLET | Freq: Once | ORAL | Status: AC
  Administered 2017-05-11: 2.5 mg via ORAL
  Filled 2017-05-11: qty 1

## 2017-05-11 MED ORDER — DOBUTAMINE IN D5W 4-5 MG/ML-% IV SOLN: 2.5000 ug/kg/min | INTRAVENOUS | Status: DC

## 2017-05-11 NOTE — Progress Notes (Signed)
Nephrology notified of difficulty in obtaining BP, PI at 3.  Telephone orders received, 500 mL bolus and UF d/c for remainder of tx.  Will continue to monitor.

## 2017-05-11 NOTE — Progress Notes (Signed)
Patient ID: David [name], male   DOB: 24-Aug-1965, 52 y.o.   MRN: [id number]   Advanced Heart Failure VAD Team Note  Subjective:    David [name] is a 52 y.o. male with a PMH of ICM ('09 PCI to LAD, mRCA, '10 PCI to Lcx and '12 mRCA), HFrEF (EF ~20% for >5 years), HLD and HTN admitted 12/26 for VAD placement due to end-stage systolic HF.   - Underwent successful HM-3 LVAD placement 04/20/17. 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  - Developed AKI. CVVHD started 12/30 - ReIntubated 04/23/17  - Developed progressive shock with uptitration of norepinephrine and epinephrine and addition of vasopressin.  Milrinone stopped and dobutamine begun.  CXR with CHF.   - Re-extubated 04/26/2017 - Started on Meropenem 04/27/16 with + Achromobacter in tracheal aspirate. (Finished 05/07/17) - CVVHD stopped 1/10 - 1st iHD 1/12 - Stitch placed in bleeding driveline from over weekend 05/08/17 - Tunneled HD cath placed 05/08/17  Feeling Ok this am. Anxious about moving out of ICU. Making a lot more urine. Denies lightheadedness or dizziness. Denies SOB. No orthopnea, PND.   Made 1.1 L of UOP yesterday.  Though creatinine 3.68 -> 4.94. Excretion > filtration.   Hgb 7.8 -> 8.0. No bleeding. No further transfusion.   LDH 355 => 319 => 295 => 315 => 265 => 263 => 281 INR 1.6 => 1.8 => 1.95 => 1.43 => 1.49 => 1.63 => 1.99  Echo 12/31 reviewed: No significant pericardial effusion, IV septum midline to slightly to the right, RV function adequate.  LVAD INTERROGATION:  HeartMate III LVAD:  Flow 4.6 liters/min, speed 5400, power 4.0, PI 4.7, > 60 PI events.   Objective:    Vital Signs:   Temp:  [97.9 F (36.6 C)-98.5 F (36.9 C)] 98.5 F (36.9 C) (01/16 2300) Pulse Rate:  [82-89] 89 (01/16 1519) Resp:  [10-23] 14 (01/17 0700) BP: (88-108)/(71-88) 91/71 (01/17 0500) SpO2:  [90 %-97 %] 97 % (01/17 0600) Weight:  [168 lb 4.8 oz (76.3 kg)] 168 lb 4.8 oz (76.3 kg) (01/17 0500) Last BM Date: 05/05/17 Mean  arterial Pressure 80s  Intake/Output:   Intake/Output Summary (Last 24 hours) at 05/11/2017 0741 Last data filed at 05/11/2017 0700 Gross per 24 hour  Intake 1858 ml  Output 1100 ml  Net 758 ml   Physical Exam   Physical Exam:  GENERAL: Well appearing this am. NAD.  HEENT: Normal. NECK: Supple, JVP to jaw. Carotids OK.  CARDIAC:  Mechanical heart sounds with LVAD hum present.  Right chest port-a-cath  LUNGS:  CTAB, normal effort.  ABDOMEN:  NT, ND, no HSM. No bruits or masses. +BS  LVAD exit site: Dressing dry and intact. No erythema or drainage. Stabilization device present and accurately applied. Driveline dressing changed daily per sterile technique. EXTREMITIES:  Warm and dry. No cyanosis, clubbing, or rash. or edema.  NEUROLOGIC:  Alert & oriented x 3. Cranial nerves grossly intact. Moves all 4 extremities w/o difficulty. Affect pleasant     Telemetry   NSR 80s, personally reviewed.   Labs   Basic Metabolic Panel: Recent Labs  Lab 05/07/17 0456  05/08/17 0530  05/09/17 0224 05/09/17 1605 05/10/17 0032 05/10/17 1552 05/11/17 0302  NA 129*   < > 130*   < > 130* 131* 133* 132* 130*  K 4.1   < > 4.3   < > 4.3 3.5 3.8 3.5 3.6  CL 96*   < > 94*   < > 95*  95* 96* 96* 93*  CO2 23   < > 25   < > 24 26 27 26 26   GLUCOSE 86   < > 92   < > 129* 131* 103* 134* 100*  BUN 16   < > 21*   < > 29* 10 12 15 19   CREATININE 3.81*   < > 4.97*   < > 5.73* 3.22* 3.68* 4.54* 4.94*  CALCIUM 8.1*   < > 8.6*   < > 8.4* 7.9* 8.1* 8.2* 8.6*  MG 1.9  --  2.2  --  2.1  --  1.9  --  1.8  PHOS  --    < > 5.7*   < > 6.5* 3.2 4.1 4.2 5.9*   < > = values in this interval not displayed.    Liver Function Tests: Recent Labs  Lab 05/07/17 0456  05/09/17 0224 05/09/17 1605 05/10/17 0032 05/10/17 1552 05/11/17 0302  AST 45*  --   --   --   --   --   --   ALT 32  --   --   --   --   --   --   ALKPHOS 261*  --   --   --   --   --   --   BILITOT 3.2*  --   --   --   --   --   --   PROT 7.0   --   --   --   --   --   --   ALBUMIN 1.7*   < > 1.7* 1.6* 1.7* 1.6* 1.8*   < > = values in this interval not displayed.   No results for input(s): LIPASE, AMYLASE in the last 168 hours. No results for input(s): AMMONIA in the last 168 hours.  CBC: Recent Labs  Lab 05/08/17 0530 05/09/17 0350 05/10/17 0032 05/10/17 1236 05/11/17 0302  WBC 10.9* 10.6* 9.8 11.4* 11.2*  HGB 8.9* 7.8* 7.8* 7.9* 8.0*  HCT 27.7* 24.9* 24.6* 24.8* 25.6*  MCV 84.7 83.6 84.2 84.1 84.5  PLT 360 324 306 324 342    INR: Recent Labs  Lab 05/07/17 1530 05/08/17 0530 05/09/17 0224 05/10/17 0032 05/11/17 0302  INR 1.68 1.43 1.49 1.63 1.99    Other results:  Imaging   No results found.   Medications:     Scheduled Medications: . aspirin EC  81 mg Oral Daily  . atorvastatin  40 mg Oral q1800  . chlorhexidine  15 mL Mouth Rinse BID  . Chlorhexidine Gluconate Cloth  6 each Topical Daily  . citalopram  10 mg Oral Daily  . darbepoetin (ARANESP) injection - DIALYSIS  200 mcg Intravenous Q Thu-HD  . feeding supplement  1 Container Oral BID BM  . ferrous [id number] C-folic acid  1 capsule Oral BID BM  . gabapentin  100 mg Oral BID  . mouth rinse  15 mL Mouth Rinse q12n4p  . pantoprazole  40 mg Oral Daily  . Warfarin - Pharmacist Dosing Inpatient   Does not apply q1800    Infusions: . sodium chloride Stopped (04/30/17 0800)  . ferric gluconate (FERRLECIT/NULECIT) IV    . heparin 1,450 Units/hr (05/11/17 0700)    PRN Medications: levalbuterol, ondansetron (ZOFRAN) IV, oxyCODONE, simethicone, sodium chloride flush, sodium chloride flush   Patient Profile   David [name] is a 52 y.o. male with a PMH of ICM ('09 PCI to LAD, mRCA, '10 PCI to Lcx and '12 mRCA), HFrEF (  EF ~20% for >5 years) s/p HM-III VAD placement on 12/27 due to end-stage systolic HF.   Assessment/Plan:    1. Acute on chronic systolic HF: Echo 10/16/74 with LVEF 20-25%, Mild MR, Mild/Mod MR, Severe LAE, Mildly  reduced RV, PA peak pressure 56 mm Hg.  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 Extubated, initially stopped epinephrine/norepinephrine but restarted with fall in UOP and MAP.  With progressive renal failure, pulmonary edema, and hypotension, he was re-intubated on 12/30.   CVVH was begun.  12/31 was difficult day with refractory shock (MAP in 60s).  Reviewed 12/31 echo which showed no significant pericardial effusion, adequate RV function, and IV septum near mid-line.  Off NE/Epi.  - Off dobutamine. Central line pulled.  - Urine output improving, but creatinine continues to rise off dialysis days.  Will hold off on lasix this am with increased PI events, but will discuss with MD.  - MAPs in 70-80s - Continue aspirin 81 mg daily.  - Warfarin goal INR 2-2.5. INR 1.99. Can likely stop heparin.  2. CAD s/p PCI to mid RCA and PLOM with DES x 2 and DES to mid LAD in 1/18.   - Continue ASA and statin. No change.  3. PAF: - Remains in NSR. Personally reviewed.   4. ARF on CKD stage III: Suspect some degree of intrinsic renal disease prior to LVAD placement (baseline creatinine around 1.5) with probable ATN from intra-op/peri-op hypotension and development of vasodilatory/septic shock.  CVVHD stopped 05/04/2017.  - Had 1.1 L of UOP yesterday, though creatinine rapidly rising.  - First iHD 05/06/17 without problems.  - Tunneled HD cath placed 05/08/17.  5. Anemia: s/p 1 u PRBCs 12/30, 04/25/17, 1/8.  - Hgb 8.0 this am. Stable.  - Want to limit transfusion with possibility of transplant down the road. Follow closely.  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 ID: Started on cefepime empirically with septic/vasodilatory shock, PCT was 31 04/25/17.  ABX broadened to Meropenem 04/27/16 with + Achromobacter on tracheal aspirate.  - Finished 05/07/17 (10 days)  PCT 8.31 04/27/17 -> 3.87 ->0.94 - Afebrile. Off ABX.  7. Elevated LFTs due to shock liver - LFTs nearly normal.  No change.  8. Hyponatremia:  - Stable. 9. Driveline bleeding - Resolved after stitch placed by Dr. Cyndia Bent. Plan to remove stitch today to reduce infection risk. - Coumadin resumed 05/08/17 - Heparin back on.   Length of Stay: [address]  Annamaria Helling 05/11/2017, 7:41 AM  VAD Team --- VAD ISSUES ONLY--- Pager [phone] (7am - 7am)  Advanced Heart Failure Team  Pager [phone] (M-F; 7a - 4p)  Please contact [facility] for night-coverage after hours (4p -7a ) and weekends on [email or web address]  Patient seen and examined with the above-signed Advanced Practice Provider and/or Housestaff. I personally reviewed laboratory data, imaging studies and relevant notes. I independently examined the patient and formulated the important aspects of the plan. I have edited the note to reflect any of my changes or salient points. I have personally discussed the plan with the patient and/or family.  Urine output picking up. But creatinine still climbing. Will repeat lasix today. Discussed with Renal. Will not do HD today.   Now off dobutamine. Co-ox stable. Hgb low but stable. No further bleeding from driveline site. INR 1.99. Heparin stopped. Continue coumadin.   VAD interrogated personally. Parameters stable. Can move to [place]. Continue ambulation and teaching.   Glori Bickers,  MD  9:01 AM

## 2017-05-11 NOTE — Progress Notes (Addendum)
Physical Therapy Treatment Patient Details Name: Jimy Gates MRN: [id number] DOB: Dec 10, 1965 Today's Date: 05/11/2017    History of Present Illness This 52 y.o. male admitted for LVAD. Pt extubated 12/28.   on 12/30, pt developed respiratory distress with pulmonary edema, and increased creatanine.  He was intubated 12/30 > 04/26/16, and CRRT initiated 12/30 and discontinued 1/10. Pt now on intermittent HD.   PMH includes:  Acute on chronic systolic HF, CAD, PAF, CKD stage III, LBBB, frequent PVCs, seizures, PNS, MI, ischemic cardiomyopathy, s/p AICD     PT Comments    Pt making steady progress. Pt performed transfer for power module to batteries and back with verbal cues for sequence. Reviewed contents of black back up bag. Pt continues to needing cuing for all aspects of managing his device but was willing to practice. Will continue to review with pt. Some myoclonic jerking noted at rest but didn't interfere with mobility. Hopefully will improve after HD.  Follow Up Recommendations  Supervision/Assistance - 24 hour;Home health PT     Equipment Recommendations  Other (comment)(rollator)    Recommendations for Other Services       Precautions / Restrictions Precautions Precautions: Fall;Sternal;Other (comment)(LVAD) Precaution Comments: requires min cues for sternal precautions Restrictions Other Position/Activity Restrictions: sternal precautions    Mobility  Bed Mobility Overal bed mobility: Needs Assistance Bed Mobility: Sit to Supine       Sit to supine: Supervision      Transfers Overall transfer level: Needs assistance Equipment used: 4-wheeled walker Transfers: Sit to/from Stand Sit to Stand: Supervision         General transfer comment: Verbal cues initially to put hands on knees for sternal precautions and to be aware of controller prior to standing  Ambulation/Gait Ambulation/Gait assistance: Supervision   Assistive device: 4-wheeled walker Gait  Pattern/deviations: Step-through pattern;Decreased stride length Gait velocity: decr Gait velocity interpretation: Below normal speed for age/gender General Gait Details: supervision for lines and safety. Pt amb on 2L of O2. Unable to obtain any SpO2 reading at rest or with amb   Stairs            Wheelchair Mobility    Modified Rankin (Stroke Patients Only)       Balance Overall balance assessment: Needs assistance Sitting-balance support: Feet supported;No upper extremity supported Sitting balance-Leahy Scale: Good     Standing balance support: No upper extremity supported Standing balance-Leahy Scale: Fair Standing balance comment: supervision for static standing                            Cognition Arousal/Alertness: Awake/alert Behavior During Therapy: WFL for tasks assessed/performed Overall Cognitive Status: Impaired/Different from baseline Area of Impairment: Attention;Memory                   Current Attention Level: Selective Memory: Decreased short-term memory;Decreased recall of precautions         General Comments: Pt needs cues to sternal precautions and to switch from power module to batteries and back      Exercises      General Comments        Pertinent Vitals/Pain Pain Assessment: 0-10 Pain Score: 7  Pain Location: sternal Pain Descriptors / Indicators: Grimacing;Sore Pain Intervention(s): Limited activity within patient's tolerance;Monitored during session;Patient requesting pain meds-RN notified    Home Living  Prior Function            PT Goals (current goals can now be found in the care plan section) Acute Rehab PT Goals PT Goal Formulation: With patient Time For Goal Achievement: 05/25/17 Potential to Achieve Goals: Good Progress towards PT goals: Goals met and updated - see care plan;Progressing toward goals    Frequency    Min 3X/week      PT Plan Current plan  remains appropriate    Co-evaluation PT/OT/SLP Co-Evaluation/Treatment: Yes            AM-PAC PT "6 Clicks" Daily Activity  Outcome Measure  Difficulty turning over in bed (including adjusting bedclothes, sheets and blankets)?: A Little Difficulty moving from lying on back to sitting on the side of the bed? : A Little Difficulty sitting down on and standing up from a chair with arms (e.g., wheelchair, bedside commode, etc,.)?: A Little Help needed moving to and from a bed to chair (including a wheelchair)?: A Little Help needed walking in hospital room?: A Little Help needed climbing 3-5 steps with a railing? : A Lot 6 Click Score: 17    End of Session Equipment Utilized During Treatment: Oxygen;Other (comment)(LVAD back up bag) Activity Tolerance: Patient tolerated treatment well Patient left: with call bell/phone within reach;in bed Nurse Communication: Mobility status PT Visit Diagnosis: Unsteadiness on feet (R26.81);Muscle weakness (generalized) (M62.81);Difficulty in walking, not elsewhere classified (R26.2)     Time: 1103-1594 PT Time Calculation (min) (ACUTE ONLY): 47 min  Charges:  $Gait Training: 38-52 mins                    G Codes:       [facility] PT [place] 05/11/2017, 10:12 AM

## 2017-05-11 NOTE — Progress Notes (Signed)
Pts wife called Therapist, sports. Expressed concern for pts tremors/jerking movements as well as previous transfusion history. Wife expressed concern that the healthcare team was not communicating to her about pts need for blood transfusions and was not aware of the amount the pt has received. Wife update on the number of transfusions and the need for them. RN reassured wife and stressed that her concerns would be passed along to day shift RN.

## 2017-05-11 NOTE — Progress Notes (Signed)
Occupational Therapy Treatment Patient Details Name: David [name] MRN: [id number] DOB: Jan 17, 1966 Today's Date: 05/11/2017    History of present illness This 52 y.o. male admitted for LVAD. Pt extubated 12/28.   on 12/30, pt developed respiratory distress with pulmonary edema, and increased creatanine.  He was intubated 12/30 > 04/26/16, and CRRT initiated 12/30 and discontinued 1/10. Pt now on intermittent HD.   PMH includes:  Acute on chronic systolic HF, CAD, PAF, CKD stage III, LBBB, frequent PVCs, seizures, PNS, MI, ischemic cardiomyopathy, s/p AICD    OT comments  Pt demonstrates difficulty accessing bil. Feet Rt>Lt for LB ADLs, and currently requires mod A for ADLs.  He was able to switch from [facility] > battery with 2 verbal cues and min A to don vest/harness.  He was able to verbalize equipment needed in black bag.   He ambulated 650 ft with min guard assist VSS.    Follow Up Recommendations  Home health OT;Supervision/Assistance - 24 hour    Equipment Recommendations  Tub/shower seat    Recommendations for Other Services      Precautions / Restrictions Precautions Precautions: Fall;Sternal;Other (comment) Precaution Comments: requires min cues for sternal precautions Restrictions Weight Bearing Restrictions: No       Mobility Bed Mobility Overal bed mobility: Needs Assistance Bed Mobility: Sit to Supine       Sit to supine: Supervision      Transfers Overall transfer level: Needs assistance Equipment used: 4-wheeled walker Transfers: Sit to/from [employer] Transfers Sit to Stand: Supervision Stand pivot transfers: Min guard       General transfer comment: verbal cues for safety     Balance Overall balance assessment: Needs assistance Sitting-balance support: Feet supported;No upper extremity supported Sitting balance-Leahy Scale: Good     Standing balance support: No upper extremity supported Standing balance-Leahy Scale: Fair Standing balance  comment: supervision for static standing                           ADL either performed or assessed with clinical judgement   ADL Overall ADL's : Needs assistance/impaired             Lower Body Bathing: Moderate assistance;Sit to/from stand       Lower Body Dressing: Moderate assistance;Sit to/from stand Lower Body Dressing Details (indicate cue type and reason): Pt is able to access Lt foot to don/doff socks with min A, but requires max A for Rt LE  Toilet Transfer: Min guard;Ambulation;Comfort height toilet   Toileting- Clothing Manipulation and Hygiene: Minimal assistance;Sit to/from stand       Functional mobility during ADLs: Passenger transport manager     Praxis      Cognition Arousal/Alertness: Awake/alert Behavior During Therapy: WFL for tasks assessed/performed Overall Cognitive Status: Within Functional Limits for tasks assessed(for ADLs and managing LVAD equipment )                                          Exercises     Shoulder Instructions       General Comments VSS.  Pt was able to switch from [facility] to battery with 2 verbal cues.  He required min A to don harness/vest, and cues to connect controller into belt safely.  He independently checked battery status  and attached clips to batteries.  He was able to tell me what needed to be in black bag with one cue and that black bag should travel with him at all times     Pertinent Vitals/ Pain       Pain Assessment: Faces Faces Pain Scale: Hurts little more Pain Location: sternal Pain Descriptors / Indicators: Grimacing;Sore Pain Intervention(s): Monitored during session  Home Living                                          Prior Functioning/Environment              Frequency  Min 2X/week        Progress Toward Goals  OT Goals(current goals can now be found in the care plan section)  Progress towards OT goals:  Progressing toward goals     Plan Discharge plan remains appropriate    Co-evaluation                 AM-PAC PT "6 Clicks" Daily Activity     Outcome Measure   Help from another person eating meals?: None Help from another person taking care of personal grooming?: A Little Help from another person toileting, which includes using toliet, bedpan, or urinal?: A Little Help from another person bathing (including washing, rinsing, drying)?: A Lot Help from another person to put on and taking off regular upper body clothing?: A Little Help from another person to put on and taking off regular lower body clothing?: A Lot 6 Click Score: 17    End of Session Equipment Utilized During Treatment: Oxygen  OT Visit Diagnosis: Unsteadiness on feet (R26.81)   Activity Tolerance Patient tolerated treatment well   Patient Left in bed;with call bell/phone within reach;with family/visitor present;with nursing/sitter in room   Nurse Communication Mobility status        Time: 8527-7824 OT Time Calculation (min): 49 min  Charges: OT General Charges $OT Visit: 1 Visit OT Treatments $Self Care/Home Management : 8-22 mins $Therapeutic Activity: 23-37 mins  [facility], OTR/L [phone]    [name] M 05/11/2017, 6:33 PM

## 2017-05-11 NOTE — Progress Notes (Signed)
CSW met with patient's wife in the waiting area. Wife feeling a bit overwhelmed with lengthy hospitalization. CSW provided supportive intervention and focused on day to day rather than down the road. CSW discussed attending support group for peer to peer support and trying some coping relaxation techniques to reduce anxiety and depressive symptoms. Wife appreciative of support and will follow through on suggested interventions. CSW will also touch base with VAD Coordinator to rely information. CSW will continue to follow for support and transition to home. Raquel Sarna, [place], [place]

## 2017-05-11 NOTE — Progress Notes (Signed)
Notified HF team re: pt being on HD and unable to obtain manual BP; last MAP was 55; PI 2.2-2.8.  Orders received to give 551ml fluid and recheck MAP will start Dobutamine if MAP does not return to 701's.  Will continue to monitor.

## 2017-05-11 NOTE — Progress Notes (Signed)
David David [name] Progress Note    Assessment/ Plan:   1.  AKI on CKD III: likely due to ATN from shock s/p LVAD placement with underlying arterionephrosclerosis.  Required CRRT initially and now has been transitioned to IHD (1/11).  Last IHD 1/15.  S/p TDC with IR 05/08/17.  UOP increasing but no signs of return of GFR.  Dialysis today.  May have some uremia as demonstrating mild asterixis.    2.  Acute on chronic systolic CHF: s/p Heartmate III 12/27.  Advanced HF and cardiothoracic surgery following.  He required a stitch for driveline bleeding 9/37.  Coumadin has been restarted (1/14)   3.  Elevated LFTs: shock liver, nearly resolved.  4.  Hyponatremia: vol related,  Improving.  5.  Achromobacter pneumonia: s/p antibiotics (ended 1/13, 10 days)  Subjective:    Increasing UOP with high-dose diuretics- having some jerking movements this morning.  Will plan for HD today.   Objective:   BP 91/71   Pulse 89   Temp 98.5 F (36.9 C) (Oral)   Resp 14   Ht 5\' 5"  (1.651 m)   Wt 76.3 kg (168 lb 4.8 oz)   SpO2 97%   BMI 28.01 kg/m   Intake/Output Summary (Last 24 hours) at 05/11/2017 3428 Last data filed at 05/11/2017 0800 Gross per 24 hour  Intake 1783.5 ml  Output 850 ml  Net 933.5 ml   Weight change: 0.14 kg (5 oz)  Physical Exam: David [name]:TLXBW gentleman, NAD, sitting in chair HEENT: some JVD upright CVS: mechanical hum of LVAD Resp: normal WOB today, will wearing O2 Abd: + drive line LUQ, picture reviewed in Epic- no further bleeding Ext:1+ LE edema ACCESS: R IJ TDC In place NEURO: mild asterixis  Imaging: No results found.  Labs: BMET Recent Labs  Lab 05/08/17 0530 05/08/17 1552 05/09/17 0224 05/09/17 1605 05/10/17 0032 05/10/17 1552 05/11/17 0302  NA 130* 129* 130* 131* 133* 132* 130*  K 4.3 4.2 4.3 3.5 3.8 3.5 3.6  CL 94* 94* 95* 95* 96* 96* 93*  CO2 25 23 24 26 27 26 26   GLUCOSE 92 109* 129* 131* 103* 134* 100*  BUN 21* 26* 29* 10 12 15 19    CREATININE 4.97* 5.40* 5.73* 3.22* 3.68* 4.54* 4.94*  CALCIUM 8.6* 8.3* 8.4* 7.9* 8.1* 8.2* 8.6*  PHOS 5.7* 6.1* 6.5* 3.2 4.1 4.2 5.9*   CBC Recent Labs  Lab 05/09/17 0350 05/10/17 0032 05/10/17 1236 05/11/17 0302  WBC 10.6* 9.8 11.4* 11.2*  HGB 7.8* 7.8* 7.9* 8.0*  HCT 24.9* 24.6* 24.8* 25.6*  MCV 83.6 84.2 84.1 84.5  PLT 324 306 324 342    Medications:    . aspirin EC  81 mg Oral Daily  . atorvastatin  40 mg Oral q1800  . chlorhexidine  15 mL Mouth Rinse BID  . Chlorhexidine Gluconate Cloth  6 each Topical Daily  . citalopram  10 mg Oral Daily  . darbepoetin (ARANESP) injection - DIALYSIS  200 mcg Intravenous Q Thu-HD  . feeding supplement  1 Container Oral BID BM  . ferrous [id number] C-folic acid  1 capsule Oral BID BM  . gabapentin  100 mg Oral BID  . mouth rinse  15 mL Mouth Rinse q12n4p  . metolazone  5 mg Oral Once  . pantoprazole  40 mg Oral Daily  . warfarin  2.5 mg Oral ONCE-1800  . Warfarin - Pharmacist Dosing Inpatient   Does not apply [name], MD [place]  Kidney David [name] pgr [phone] 05/11/2017, 9:23 AM

## 2017-05-11 NOTE — Progress Notes (Signed)
Dialysis treatment completed.  450 mL ultrafiltrated.  -550 mL net fluid removal.  Patient status unchanged. Lung sounds diminished and clear to ausculation in all fields. BLE 1+ pitting edema. Cardiac: NSR, LVAD.  Cleansed RIJ catheter with chlorhexidine.  Disconnected lines and flushed ports with saline per protocol.  Ports locked with heparin and capped per protocol.    Report given to bedside, RN Elmyra Ricks.

## 2017-05-11 NOTE — Progress Notes (Signed)
Orthopedic Tech Progress Note Patient Details:  David [name] January 15, 1966 [id number]  Ortho Devices Type of Ortho Device: Louretta Parma boot Ortho Device/Splint Location: Bilateral unna boots Ortho Device/Splint Interventions: Application   Post Interventions Patient Tolerated: Well Instructions Provided: Care of device   [place] Pink 05/11/2017, 12:47 PM

## 2017-05-11 NOTE — Progress Notes (Signed)
Patient ID: David [name], male   DOB: November 01, 1965, 52 y.o.   MRN: [id number] HeartMate 3 Rounding Note  Subjective:    No bleeding around drive line.  Ambulated 4 1/2 laps this am total so far  Had 350 cc and 125 cc urine this am but creat continues to rise.    LVAD INTERROGATION:  HeartMate IIl LVAD:  Flow 4.6 liters/min, speed 5400, power 4, PI 3.4    Objective:    Vital Signs:   Temp:  [97.9 F (36.6 C)-98.5 F (36.9 C)] 98.5 F (36.9 C) (01/17 0801) Pulse Rate:  [89] 89 (01/16 1519) Resp:  [10-20] 14 (01/17 0700) BP: (88-108)/(71-88) 91/71 (01/17 0500) SpO2:  [90 %-97 %] 97 % (01/17 0600) Weight:  [76.3 kg (168 lb 4.8 oz)] 76.3 kg (168 lb 4.8 oz) (01/17 0500) Last BM Date: 05/05/17 Mean arterial Pressure 80's  Intake/Output:   Intake/Output Summary (Last 24 hours) at 05/11/2017 0952 Last data filed at 05/11/2017 0939 Gross per 24 hour  Intake 1783.5 ml  Output 975 ml  Net 808.5 ml     Physical Exam: General:  Chronically ill-appearing but awake and alert, good spirits.   No resp difficulty Cor: intermittent heart sounds with LVAD hum present. Lungs: clear Abdomen: soft, nontender, nondistended. Good bowel sounds. Extremities: mild bilateral LE edema Neuro: alert & orientedx3,  moves all 4 extremities w/o difficulty. Affect pleasant The sternotomy and abdominal incision are healing well.  Telemetry: sinus rhythm 80's  Labs: Basic Metabolic Panel: Recent Labs  Lab 05/07/17 0456  05/08/17 0530  05/09/17 0224 05/09/17 1605 05/10/17 0032 05/10/17 1552 05/11/17 0302  NA 129*   < > 130*   < > 130* 131* 133* 132* 130*  K 4.1   < > 4.3   < > 4.3 3.5 3.8 3.5 3.6  CL 96*   < > 94*   < > 95* 95* 96* 96* 93*  CO2 23   < > 25   < > 24 26 27 26 26   GLUCOSE 86   < > 92   < > 129* 131* 103* 134* 100*  BUN 16   < > 21*   < > 29* 10 12 15 19   CREATININE 3.81*   < > 4.97*   < > 5.73* 3.22* 3.68* 4.54* 4.94*  CALCIUM 8.1*   < > 8.6*   < > 8.4* 7.9* 8.1* 8.2* 8.6*   MG 1.9  --  2.2  --  2.1  --  1.9  --  1.8  PHOS  --    < > 5.7*   < > 6.5* 3.2 4.1 4.2 5.9*   < > = values in this interval not displayed.    Liver Function Tests: Recent Labs  Lab 05/07/17 0456  05/09/17 0224 05/09/17 1605 05/10/17 0032 05/10/17 1552 05/11/17 0302  AST 45*  --   --   --   --   --   --   ALT 32  --   --   --   --   --   --   ALKPHOS 261*  --   --   --   --   --   --   BILITOT 3.2*  --   --   --   --   --   --   PROT 7.0  --   --   --   --   --   --   ALBUMIN 1.7*   < > 1.7*  1.6* 1.7* 1.6* 1.8*   < > = values in this interval not displayed.   No results for input(s): LIPASE, AMYLASE in the last 168 hours. No results for input(s): AMMONIA in the last 168 hours.  CBC: Recent Labs  Lab 05/08/17 0530 05/09/17 0350 05/10/17 0032 05/10/17 1236 05/11/17 0302  WBC 10.9* 10.6* 9.8 11.4* 11.2*  HGB 8.9* 7.8* 7.8* 7.9* 8.0*  HCT 27.7* 24.9* 24.6* 24.8* 25.6*  MCV 84.7 83.6 84.2 84.1 84.5  PLT 360 324 306 324 342    INR: Recent Labs  Lab 05/07/17 1530 05/08/17 0530 05/09/17 0224 05/10/17 0032 05/11/17 0302  INR 1.68 1.43 1.49 1.63 1.99    Other results:  EKG:   Imaging: No results found.   Medications:     Scheduled Medications: Marland Kitchen Darbepoetin Alfa      . aspirin EC  81 mg Oral Daily  . atorvastatin  40 mg Oral q1800  . chlorhexidine  15 mL Mouth Rinse BID  . Chlorhexidine Gluconate Cloth  6 each Topical Daily  . citalopram  10 mg Oral Daily  . darbepoetin (ARANESP) injection - DIALYSIS  200 mcg Intravenous Q Thu-HD  . feeding supplement  1 Container Oral BID BM  . ferrous [id number] C-folic acid  1 capsule Oral BID BM  . gabapentin  100 mg Oral BID  . mouth rinse  15 mL Mouth Rinse q12n4p  . pantoprazole  40 mg Oral Daily  . polyethylene glycol  17 g Oral Daily  . warfarin  2.5 mg Oral ONCE-1800  . Warfarin - Pharmacist Dosing Inpatient   Does not apply q1800    Infusions: . sodium chloride Stopped (04/30/17 0800)  .  ferric gluconate (FERRLECIT/NULECIT) IV    . furosemide      PRN Medications: levalbuterol, ondansetron (ZOFRAN) IV, oxyCODONE, simethicone, sodium chloride flush, sodium chloride flush   Assessment:   1. Ischemic cardiomyopathy s/p multiple PCI's with chronic systolic heart failure and EF 20% on home dobutamine preop with significant improvement in symptoms. Evaluated at [facility] for heart transplant but LVAD felt to be the best treatment for him at this time. POD22s/p HeartMate III LVAD. He was critically ill with multi-system organ failure but has continued to recover and only issue now is renal failure requiring HD. Hemodynamics stable off dobutamine 2.H/OHypertension 3.H/OHyperlipidemia 4. PAF in sinuspreopon amiodarone and Eliquis.Now back in sinus off amiodarone.  5.H/OStage III CKD. Acute on chronic postop renal failureon intermittent HD. Making some urine but creat continues to rise. Nephrology planning HD today.  Still hopeful for recovery.  6. LBBB s/p CRT-D upgrade in 10/2016 without improvement in symptoms or EF. LV epicardial leads cut off at the time of surgery. 7. Prior smoking with mild obstruction and severe diffusion defect on PFT's. Continue IS. 8. Postop leukocytosis and fever. WBC normal.   9. Marked rise in bilirubin and transaminases. Likely due to pump runand right sided congestion, hypotension.Resolving.   10. INR is1.99 today. Coumadin per pharmacy. DC heparin.    11. Severe malnutrition with albumin 1.6. Taking PO fairly well. Continue to encourage nutrition.    12. PT    13. Anemia: follow.    14. Jerking movements of unclear etiology but I wonder if related to Celexa or Neurontin, especially with renal failure.  Plan/Discussion:    Overall he has been stable. MAP in good range off pressors.  VAD flow has been great.  Intermittent HD.  VAD coordinator to change drive line dressing today. If it is  still red I would start on oral antibiotic like  Keflex. Continue nutrition, ambulation, IS. Transferring to [facility].  I reviewed the LVAD parameters from today, and compared the results to the patient's prior recorded data.  No programming changes were made.  The LVAD is functioning within specified parameters.   LVAD interrogation was negative for any significant power changes, alarms or PI events/speed drops.  LVAD equipment check completed and is in good working order.  Back-up equipment present.   LVAD education done on emergency procedures and precautions and reviewed exit site care.  Length of Stay: [address]  Fernande Boyden [facility] 05/11/2017, 9:52 AM

## 2017-05-11 NOTE — Progress Notes (Signed)
[facility] for Coumadin/Heparin Indication: LVAD  Allergies  Allergen Reactions  . Plavix [Clopidogrel Bisulfate] Hives   Patient Measurements: Height: 5\' 5"  (165.1 cm) Weight: 168 lb 4.8 oz (76.3 kg) IBW/kg (Calculated) : 61.5  Vital Signs: Temp: 98.5 F (36.9 C) (01/17 0801) Temp Source: Oral (01/17 0801) BP: 91/71 (01/17 0500)  Labs: Recent Labs    05/09/17 0224  05/10/17 0032 05/10/17 1236 05/10/17 1552 05/11/17 0302  HGB  --    < > 7.8* 7.9*  --  8.0*  HCT  --    < > 24.6* 24.8*  --  25.6*  PLT  --    < > 306 324  --  342  LABPROT 17.9*  --  19.2*  --   --  22.4*  INR 1.49  --  1.63  --   --  1.99  HEPARINUNFRC  --   --  0.26* 0.48  --  0.67  CREATININE 5.73*   < > 3.68*  --  4.54* 4.94*   < > = values in this interval not displayed.   Estimated Creatinine Clearance: 16.5 mL/min (A) (by C-G formula based on SCr of 4.94 mg/dL (H)).  Assessment: 52 yo male s/p LVAD implantation 12/27.   IV heparin and Coumadin on hold for tunneled catheter 1/15.  Also with bleeding from driveline site.  Driveline site bleeding improved today, did not require dressing change overnight.   Heparin resumed 1450 uts/hr  HL 0.6 at goal.  INR 1.99 rounds to 2> stop heparin  bleeding resolved, CBC stable. Starting to eat better and feel stronger.  Goal of Therapy:  INR 2-2.5 Monitor platelets by anticoagulation protocol: Yes    Plan: Coumadin 2.5 mg po x1 Daily PT/INR. Stop heparin drip  Bonnita Nasuti Pharm.D. CPP, BCPS Clinical Pharmacist [phone] 05/11/2017 8:10 AM

## 2017-05-11 NOTE — Progress Notes (Signed)
Arrived to patient room 2H-08 at 1000.  Reviewed treatment plan and this RN agrees with plan.  Report received from bedside RN, Elmyra Ricks.  Consent verified.  Patient A & O X 4.   Lung sounds diminished and clear to ausculation in all fields. BLE 1+ pitting edema. Cardiac:  NSR, LVAD.  Removed caps and cleansed RIJ catheter with chlorhedxidine.  Aspirated ports of heparin and flushed them with saline per protocol.  Connected and secured lines, initiated treatment at 1015.  UF Goal of 2500 mL and net fluid removal 2 L.  Will continue to monitor.

## 2017-05-11 NOTE — Progress Notes (Signed)
Exit site care performed:  Drive line exit wound care performed by wife and observed by VAD coordinator. Existing VAD dressing removed and site care performed using sterile technique.Gauze and silver have a small amount of old bloody drainage. Drive line exit site cleaned with Chlora prep applicators x 2, allowed to dry, andgauzedressing withsilver stripre-applied. Exit sitehealingand unincorporated, the velour is fully implanted at exit site. No redness, tenderness, foul odor or rash noted.      Dr Cyndia Bent updated on current drive line site. Per Dr. Cyndia Bent  the pt should have a course of Keflex. Pharmacy notified and will dose Keflex for the pt.  VAD Coordinators will change again tomorrow with the wife at 1330.  Tanda Rockers RN, VAD Coordinator 24/7 pager [phone]

## 2017-05-11 NOTE — Progress Notes (Signed)
LVAD Coordinator Rounding Note:  Admitted12/26due to decompensated heart failure.  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  No bleeding from drive line overnight.  Vital signs: Tmax: 98.5 HR:79 Doppler Pressure:  Automatic cuff:  91/71 O2 Sat:97 % on 2LNC Wt:165>164>156>160>160>154>158>156>157>160>164>167>169>167>168lbs  LVAD interrogation reveals:  Speed:5300 Flow: 5.2 Power:4.0 w PI:3.3 Alarms:none Events:none Hematocrit:25 Fixed speed:5300 Low speed limit:5000  Labs:  LDH trend:205>407>576>923>967>612>528>492>467>489>441>434>405>376>355>315>265>263>281  INR trend:1.34>1.52>1.73>1.95>2.32>3.03>3.80>3.16>2.39>2.23>1.70>1.65>1.91>1.60>1.43>1.49>1.63>1.99  Anticoagulation Plan: -INR Goal:2-2.5 -ASA Dose: 81 mg   Blood Products: Intra op:2 units/FFP 04/20/17  Post op: PRBC's 1 unit 12/29 1 unit 12/30 1 unit 04/25/17 1 unit 05/02/17 1 unit 05/08/17  Device:[employer] BiV -Therapies:off  Arrhythmia: 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 (on amiodarone and Eliquis for this) - Afib 12/29 - 04/28/17 - NSR 1/5 - present  Nitric Oxide: -  stopped on 12/30 due to initiation of BiPap  Respiratory: - Extubated 12/28 - Reintubated 04/23/2017 - Extubated 04/26/2017  Renal:  04/23/17 - CRRT started for AKI - stopped 05/05/17  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 VAD coordinator will change dressing this afternoon with his wife.   2. Please call VAD Pager for any equipment concerns or drive line dressing questions/concerns.  Tanda Rockers RN, VAD Coordinator 24/7 pager [phone]

## 2017-05-12 ENCOUNTER — Inpatient Hospital Stay (HOSPITAL_COMMUNITY): Payer: Medicare HMO

## 2017-05-12 DIAGNOSIS — Z01810 Encounter for preprocedural cardiovascular examination: Secondary | ICD-10-CM

## 2017-05-12 LAB — RENAL FUNCTION PANEL
ANION GAP: 11 (ref 5–15)
ANION GAP: 8 (ref 5–15)
Albumin: 1.7 g/dL — ABNORMAL LOW (ref 3.5–5.0)
Albumin: 1.7 g/dL — ABNORMAL LOW (ref 3.5–5.0)
BUN: 8 mg/dL (ref 6–20)
BUN: 9 mg/dL (ref 6–20)
CALCIUM: 8.5 mg/dL — AB (ref 8.9–10.3)
CO2: 27 mmol/L (ref 22–32)
CO2: 27 mmol/L (ref 22–32)
Calcium: 8.5 mg/dL — ABNORMAL LOW (ref 8.9–10.3)
Chloride: 96 mmol/L — ABNORMAL LOW (ref 101–111)
Chloride: 97 mmol/L — ABNORMAL LOW (ref 101–111)
Creatinine, Ser: 3.14 mg/dL — ABNORMAL HIGH (ref 0.61–1.24)
Creatinine, Ser: 3.7 mg/dL — ABNORMAL HIGH (ref 0.61–1.24)
GFR calc Af Amer: 24 mL/min — ABNORMAL LOW (ref >60)
GFR calc Af Amer: 20 mL/min — ABNORMAL LOW (ref >60)
GFR calc non Af Amer: 21 mL/min — ABNORMAL LOW (ref >60)
GFR calc non Af Amer: 17 mL/min — ABNORMAL LOW (ref >60)
GLUCOSE: 83 mg/dL (ref 65–99)
Glucose, Bld: 125 mg/dL — ABNORMAL HIGH (ref 65–99)
PHOSPHORUS: 3.9 mg/dL (ref 2.5–4.6)
POTASSIUM: 3.5 mmol/L (ref 3.5–5.1)
Phosphorus: 3.5 mg/dL (ref 2.5–4.6)
Potassium: 3.6 mmol/L (ref 3.5–5.1)
SODIUM: 132 mmol/L — AB (ref 135–145)
Sodium: 134 mmol/L — ABNORMAL LOW (ref 135–145)

## 2017-05-12 LAB — GLUCOSE, CAPILLARY
GLUCOSE-CAPILLARY: 106 mg/dL — AB (ref 65–99)
Glucose-Capillary: 91 mg/dL (ref 65–99)
Glucose-Capillary: 74 mg/dL (ref 65–99)
Glucose-Capillary: 101 mg/dL — ABNORMAL HIGH (ref 65–99)

## 2017-05-12 LAB — CBC
HCT: 25.8 % — ABNORMAL LOW (ref 39.0–52.0)
HEMOGLOBIN: 8.1 g/dL — AB (ref 13.0–17.0)
MCH: 26.6 pg (ref 26.0–34.0)
MCHC: 31.4 g/dL (ref 30.0–36.0)
MCV: 84.9 fL (ref 78.0–100.0)
Platelets: 347 10*3/uL (ref 150–400)
RBC: 3.04 MIL/uL — AB (ref 4.22–5.81)
RDW: 19.7 % — ABNORMAL HIGH (ref 11.5–15.5)
WBC: 10.6 10*3/uL — ABNORMAL HIGH (ref 4.0–10.5)

## 2017-05-12 LAB — MAGNESIUM: Magnesium: 1.8 mg/dL (ref 1.7–2.4)

## 2017-05-12 LAB — LACTATE DEHYDROGENASE: LDH: 293 U/L — AB (ref 98–192)

## 2017-05-12 LAB — PROTIME-INR
INR: 1.97
Prothrombin Time: 22.3 seconds — ABNORMAL HIGH (ref 11.4–15.2)

## 2017-05-12 MED ORDER — WARFARIN SODIUM 5 MG PO TABS
5.0000 mg | ORAL_TABLET | Freq: Once | ORAL | Status: AC
  Administered 2017-05-12: 5 mg via ORAL
  Filled 2017-05-12: qty 1

## 2017-05-12 NOTE — Progress Notes (Signed)
Occupational Therapy Treatment Patient Details Name: David [name] MRN: [id number] DOB: Oct 23, 1965 Today's Date: 05/12/2017    History of present illness This 52 y.o. male admitted for LVAD. Pt extubated 12/28.   on 12/30, pt developed respiratory distress with pulmonary edema, and increased creatanine.  He was intubated 12/30 > 04/26/16, and CRRT initiated 12/30 and discontinued 1/10. Pt now on intermittent HD.   PMH includes:  Acute on chronic systolic HF, CAD, PAF, CKD stage III, LBBB, frequent PVCs, seizures, PNS, MI, ischemic cardiomyopathy, s/p AICD    OT comments  Pt is now able to access feet for LB ADLs - able to perform LB ADLs with min guard assist.  He, however, required significantly more assist and cuing to switch battery/AC power - required mod - max A and mod - max verbal cues.  He was unable to recognize errors to make corrections, and did not tune into alarms sounding (laid black cords unattached to his side, thinking he was finished).  See general comments below for details of performance.  Will continue for follow with focus on switching battery <> AC power and safety   Follow Up Recommendations  Home health OT;Supervision/Assistance - 24 hour    Equipment Recommendations  Tub/shower seat    Recommendations for Other Services      Precautions / Restrictions Precautions Precautions: Fall;Sternal;Other (comment) Precaution Comments: requires min cues for sternal precautions       Mobility Bed Mobility Overal bed mobility: Needs Assistance Bed Mobility: Supine to Sit;Sit to Supine     Supine to sit: Supervision Sit to supine: Supervision      Transfers Overall transfer level: Needs assistance Equipment used: Rolling walker (2 wheeled) Transfers: Sit to/from [facility] Sit to Stand: Supervision Stand pivot transfers: Min guard            Balance Overall balance assessment: Needs assistance Sitting-balance support: Feet supported;No  upper extremity supported Sitting balance-Leahy Scale: Good     Standing balance support: No upper extremity supported Standing balance-Leahy Scale: Fair                             ADL either performed or assessed with clinical judgement   ADL Overall ADL's : Needs assistance/impaired             Lower Body Bathing: Min guard;Sit to/from stand   Upper Body Dressing : Minimal assistance;Sitting   Lower Body Dressing: Min guard;Sit to/from stand Lower Body Dressing Details (indicate cue type and reason): Pt able to cross ankles over knees and also bend forward to access feet to perform LB ADLs  Toilet Transfer: Min guard;Ambulation;Comfort height toilet   Toileting- Clothing Manipulation and Hygiene: Min guard;Sit to/from stand       Functional mobility during ADLs: Passenger transport manager     Praxis      Cognition Arousal/Alertness: Awake/alert Behavior During Therapy: WFL for tasks assessed/performed Overall Cognitive Status: Impaired/Different from baseline Area of Impairment: Attention;Memory;Safety/judgement;Problem solving                   Current Attention Level: Selective Memory: Decreased short-term memory   Safety/Judgement: Decreased awareness of safety   Problem Solving: Difficulty sequencing;Requires verbal cues;Requires tactile cues General Comments: Pt required max cues and mod A to switch from battery <> power AC.   Performance worse when distractions increased  Exercises     Shoulder Instructions       General Comments Pt with jerky movements this pm, and demonstrated increased difficulty with attention/concentraion, as well as problem solving.  He required mod A  and mod cues to switch from AC>battery, and max A and max cues to switch from [facility] > battery.  Pt attempted to connect two battery clips together, and unable to recognize error; he forgot to check battery charge, he  demonstrated poor ability to monitor lines to prevent tangling and kinking.  He placed batteries in harness before donning harness, then as he attempted to don harness/vest, battery line would not reach far enough, and pt unable to monitor this situation without therapit's intervention.  When switching from battery >AC, he attempted to remove battery clip prior to removing power cord.  He attempted to connect the white to black power cord without success and complete inability to recognize error.  He attempted to hand me the battery with cord still attached.  Once cords were untangled and reorganized for him after disconnect from battery, he just laid all cords to the side with the black cords detached lying on the bed beeping.  He was completely unaware that he had not attached these and was not at all tuned into the alarm.      Pertinent Vitals/ Pain       Pain Assessment: Faces Faces Pain Scale: Hurts little more Pain Location: sternal Pain Descriptors / Indicators: Grimacing;Sore Pain Intervention(s): Monitored during session  Home Living                                          Prior Functioning/Environment              Frequency  Min 2X/week        Progress Toward Goals  OT Goals(current goals can now be found in the care plan section)  Progress towards OT goals: Progressing toward goals(continue with current goals )  Acute Rehab OT Goals Patient Stated Goal: To get stronger and go home  OT Goal Formulation: With patient Time For Goal Achievement: 05/26/17 ADL Goals Pt Will Perform Grooming: with modified independence;standing Pt Will Perform Upper Body Bathing: with modified independence;standing Pt Will Perform Lower Body Bathing: with modified independence;sit to/from stand Pt Will Perform Upper Body Dressing: with modified independence;sitting Pt Will Perform Lower Body Dressing: with modified independence;sit to/from stand Pt Will Transfer to  Toilet: with modified independence;ambulating;regular height toilet Pt Will Perform Toileting - Clothing Manipulation and hygiene: with modified independence;sit to/from stand Pt Will Perform Tub/Shower Transfer: Tub transfer;with supervision;ambulating Additional ADL Goal #1: Pt will independently manage LVAD equipment during ADLs  Plan Discharge plan remains appropriate    Co-evaluation                 AM-PAC PT "6 Clicks" Daily Activity     Outcome Measure   Help from another person eating meals?: None Help from another person taking care of personal grooming?: A Little Help from another person toileting, which includes using toliet, bedpan, or urinal?: A Little Help from another person bathing (including washing, rinsing, drying)?: A Little Help from another person to put on and taking off regular upper body clothing?: A Little Help from another person to put on and taking off regular lower body clothing?: A Little 6 Click Score: 19    End of Session  Equipment Utilized During Treatment: Oxygen  OT Visit Diagnosis: Unsteadiness on feet (R26.81)   Activity Tolerance Patient limited by fatigue   Patient Left in bed;with call bell/phone within reach;with nursing/sitter in room;with family/visitor present   Nurse Communication Mobility status;Other (comment)(VAC coordinator informed of his performance )        Time: 1300-1350 OT Time Calculation (min): 50 min  Charges: OT General Charges $OT Visit: 1 Visit OT Treatments $Self Care/Home Management : 23-37 mins $Therapeutic Activity: 8-22 mins  [facility], OTR/L [phone]    [name] M 05/12/2017, 9:59 PM

## 2017-05-12 NOTE — Progress Notes (Signed)
Patient ID: David [name], male   DOB: March 16, 1966, 52 y.o.   MRN: [id number]   Advanced Heart Failure VAD Team Note  Subjective:    David [name] is a 52 y.o. male with a PMH of ICM ('09 PCI to LAD, mRCA, '10 PCI to Lcx and '12 mRCA), HFrEF (EF ~20% for >5 years), HLD and HTN admitted 12/26 for VAD placement due to end-stage systolic HF.   - Underwent successful HM-3 LVAD placement 04/20/17. 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 CVVHD started 12/30 - Started on Meropenem 04/27/16 with + Achromobacter in tracheal aspirate. (Finished 05/07/17) - CVVHD stopped 1/10 - 1st iHD 1/12 - Tunneled HD cath placed 05/08/17 - Started on Keflex for mild drainage from Driveline 9/62/95  Feeling better today. Peripheral edema much improved. Denies SOB walking halls with PT. No lightheadedness or dizziness after MAPs improved yesterday.  Had 600 cc of UOP yesterday. Creatinine 4.94 -> 3.14.  HD ran even yesterday with low MAPs.   Hgb 7.8 -> 8.0 -> 8.1. No bleeding. No further transfusion.   LDH 355 => 319 => 295 => 315 => 265 => 263 => 281 => 293 INR 1.6 => 1.8 => 1.95 => 1.43 => 1.49 => 1.63 => 1.99 => 1.97  Echo 12/31 reviewed: No significant pericardial effusion, IV septum midline to slightly to the right, RV function adequate.  LVAD INTERROGATION:  HeartMate III LVAD:  Flow 4.6 liters/min, speed 5400, power 4.0, PI 4.1, 6-8 PI events.   Objective:    Vital Signs:   Temp:  [98.2 F (36.8 C)-98.6 F (37 C)] 98.3 F (36.8 C) (01/18 0727) Pulse Rate:  [28-85] 28 (01/18 0727) Resp:  [12-26] 19 (01/18 0727) BP: (61-119)/(44-104) 90/44 (01/18 0727) SpO2:  [93 %-99 %] 97 % (01/18 0727) Weight:  [163 lb 2.3 oz (74 kg)-169 lb 5 oz (76.8 kg)] 167 lb 5.3 oz (75.9 kg) (01/18 0525) Last BM Date: 05/05/17 Mean arterial Pressure 80 this am.   Intake/Output:   Intake/Output Summary (Last 24 hours) at 05/12/2017 0903 Last data filed at 05/12/2017 0526 Gross per 24 hour  Intake 400 ml  Output 50 ml    Net 350 ml   Physical Exam   Physical Exam:  GENERAL: NAD. Lying in bed NAD HEENT: Normal. NECK: Supple, JVP 9-10 cm. Carotids OK.  CARDIAC:  Mechanical heart sounds with LVAD hum present.  LUNGS:  CTAB, normal effort.  ABDOMEN:  NT, ND, no HSM. No bruits or masses. +BS  LVAD exit site: Dressing dry and intact. No erythema or drainage. Stabilization device present and accurately applied. Driveline dressing changed daily per sterile technique. EXTREMITIES:  Warm and dry. No cyanosis, clubbing, rash, or edema.  NEUROLOGIC:  Alert & oriented x 3. Cranial nerves grossly intact. Moves all 4 extremities w/o difficulty. Affect pleasant     Telemetry   NSR 80s, personally reviewed.   Labs   Basic Metabolic Panel: Recent Labs  Lab 05/08/17 0530  05/09/17 0224  05/10/17 0032 05/10/17 1552 05/11/17 0302 05/11/17 2136 05/12/17 0315  NA 130*   < > 130*   < > 133* 132* 130* 134* 134*  K 4.3   < > 4.3   < > 3.8 3.5 3.6 3.6 3.5  CL 94*   < > 95*   < > 96* 96* 93* 97* 96*  CO2 25   < > 24   < > 27 26 26 29 27   GLUCOSE 92   < > 129*   < >  103* 134* 100* 96 83  BUN 21*   < > 29*   < > 12 15 19 7 8   CREATININE 4.97*   < > 5.73*   < > 3.68* 4.54* 4.94* 2.87* 3.14*  CALCIUM 8.6*   < > 8.4*   < > 8.1* 8.2* 8.6* 8.2* 8.5*  MG 2.2  --  2.1  --  1.9  --  1.8  --  1.8  PHOS 5.7*   < > 6.5*   < > 4.1 4.2 5.9* 3.1 3.9   < > = values in this interval not displayed.    Liver Function Tests: Recent Labs  Lab 05/07/17 0456  05/10/17 0032 05/10/17 1552 05/11/17 0302 05/11/17 2136 05/12/17 0315  AST 45*  --   --   --   --   --   --   ALT 32  --   --   --   --   --   --   ALKPHOS 261*  --   --   --   --   --   --   BILITOT 3.2*  --   --   --   --   --   --   PROT 7.0  --   --   --   --   --   --   ALBUMIN 1.7*   < > 1.7* 1.6* 1.8* 1.7* 1.7*   < > = values in this interval not displayed.   No results for input(s): LIPASE, AMYLASE in the last 168 hours. No results for input(s): AMMONIA in  the last 168 hours.  CBC: Recent Labs  Lab 05/09/17 0350 05/10/17 0032 05/10/17 1236 05/11/17 0302 05/12/17 0315  WBC 10.6* 9.8 11.4* 11.2* 10.6*  HGB 7.8* 7.8* 7.9* 8.0* 8.1*  HCT 24.9* 24.6* 24.8* 25.6* 25.8*  MCV 83.6 84.2 84.1 84.5 84.9  PLT 324 306 324 342 347    INR: Recent Labs  Lab 05/08/17 0530 05/09/17 0224 05/10/17 0032 05/11/17 0302 05/12/17 0315  INR 1.43 1.49 1.63 1.99 1.97    Other results:  Imaging   No results found.   Medications:     Scheduled Medications: . aspirin EC  81 mg Oral Daily  . atorvastatin  40 mg Oral q1800  . cephALEXin  500 mg Oral Q12H  . chlorhexidine  15 mL Mouth Rinse BID  . Chlorhexidine Gluconate Cloth  6 each Topical Daily  . citalopram  10 mg Oral Daily  . darbepoetin (ARANESP) injection - DIALYSIS  200 mcg Intravenous Q Thu-HD  . feeding supplement  1 Container Oral BID BM  . ferrous [id number] C-folic acid  1 capsule Oral BID BM  . gabapentin  100 mg Oral BID  . mouth rinse  15 mL Mouth Rinse q12n4p  . pantoprazole  40 mg Oral Daily  . polyethylene glycol  17 g Oral Daily  . Warfarin - Pharmacist Dosing Inpatient   Does not apply q1800    Infusions: . sodium chloride Stopped (04/30/17 0800)  . ferric gluconate (FERRLECIT/NULECIT) IV Stopped (05/11/17 1353)    PRN Medications: levalbuterol, ondansetron (ZOFRAN) IV, oxyCODONE, simethicone, sodium chloride flush, sodium chloride flush   Patient Profile   David [name] is a 52 y.o. male with a PMH of ICM ('09 PCI to LAD, mRCA, '10 PCI to Lcx and '12 mRCA), HFrEF (EF ~20% for >5 years) s/p HM-III VAD placement on 12/27 due to end-stage systolic HF.   Assessment/Plan:    1. Acute  on chronic systolic HF: Echo 51/88/41 with LVEF 20-25%, Mild MR, Mild/Mod MR, Severe LAE, Mildly reduced RV, PA peak pressure 56 mm Hg.  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 Extubated, initially stopped epinephrine/norepinephrine but  restarted with fall in UOP and MAP.  With progressive renal failure, pulmonary edema, and hypotension, he was re-intubated on 12/30.   CVVH was begun.  12/31 was difficult day with refractory shock (MAP in 60s).  Reviewed 12/31 echo which showed no significant pericardial effusion, adequate RV function, and IV septum near mid-line.  Off NE/Epi.  - Off dobutamine. Central line pulled.  - Urine output stable, but creatinine continues to rise off dialysis days. Nephrology covering daily for renal recovery and HD need. - MAPs in 49s yesterday, but improved back to 70-80s after dialysis ran even. Given back 500 cc.  - Continue aspirin 81 mg daily.  - Warfarin goal INR 2-2.5. INR 1.97. Off heparin.  2. CAD s/p PCI to mid RCA and PLOM with DES x 2 and DES to mid LAD in 1/18.   - Continue ASA and statin. No change.  3. PAF: - Remains in NSR. Personally reviewed.   4. ARF on CKD stage III: Suspect some degree of intrinsic renal disease prior to LVAD placement (baseline creatinine around 1.5) with probable ATN from intra-op/peri-op hypotension and development of vasodilatory/septic shock.  CVVHD stopped 05/04/2017.  - Had 600 cc of UOP yesterday. HD 05/11/17 ran even.  - First iHD 05/06/17 without problems.  - Tunneled HD cath placed 05/08/17.  5. Anemia: s/p 1 u PRBCs 12/30, 04/25/17, 1/8.  - Hgb 8.1 this am. Stable.  - Want to limit transfusion with possibility of transplant down the road. Follow closely.  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 ID: Started on cefepime empirically with septic/vasodilatory shock, PCT was 31 04/25/17.  ABX broadened to Meropenem 04/27/16 with + Achromobacter on tracheal aspirate.  - Finished 05/07/17 (10 days)  PCT 8.31 04/27/17 -> 3.87 ->0.94 - Afebrile.  - Back on Keflex as below with mild driveline drainage.  7. Elevated LFTs due to shock liver - LFTs nearly normal. No change. 8. Hyponatremia:  - Stable.  9. Driveline bleeding - Resolved after stitch placed by  Dr. Cyndia Bent. Plan to remove stitch today to reduce infection risk. - Coumadin resumed 05/08/17 - Heparin back on.  - Mild drainage noted from Driveline. Now on Keflex as of 05/11/17  Relatively stable but remains tenuous. Continue to watch renal function closely for recovery. No indication for  dialysis today.   Length of Stay: [address]  Annamaria Helling 05/12/2017, 9:03 AM  VAD Team --- VAD ISSUES ONLY--- Pager [phone] (7am - 7am)  Advanced Heart Failure Team  Pager [phone] (M-F; 7a - 4p)  Please contact [facility] for night-coverage after hours (4p -7a ) and weekends on [email or web address]  Patient seen and examined with the above-signed Advanced Practice Provider and/or Housestaff. I personally reviewed laboratory data, imaging studies and relevant notes. I independently examined the patient and formulated the important aspects of the plan. I have edited the note to reflect any of my changes or salient points. I have personally discussed the plan with the patient and/or family.  Making slow progress. Did not tolerate volume removal with iHD yesterday so he was given fluid back due to low MAPs. MAPs more stable today. Dobutamine not restarted. Making 500-1000cc urine per day but not really filtering. Spoke with Renal  and they are planning vein mapping with possible graft placement next week.   Started on keflex for mild driveline drainage. No fevers or chills. VAD parameters stable.   Need to push ambulation and po intake. Hgb 8.1. Stable.   Glori Bickers, MD  8:06 PM

## 2017-05-12 NOTE — Progress Notes (Addendum)
Physical Therapy Treatment Patient Details Name: David [name] MRN: [id number] DOB: Nov 13, 1965 Today's Date: 05/12/2017    History of Present Illness This 52 y.o. male admitted for LVAD. Pt extubated 12/28.   on 12/30, pt developed respiratory distress with pulmonary edema, and increased creatanine.  He was intubated 12/30 > 04/26/16, and CRRT initiated 12/30 and discontinued 1/10. Pt now on intermittent HD.   PMH includes:  Acute on chronic systolic HF, CAD, PAF, CKD stage III, LBBB, frequent PVCs, seizures, PNS, MI, ischemic cardiomyopathy, s/p AICD     PT Comments    Pt performed power source transfer with min VCs for accuracy.  Pt also required review and re-education of sternal precautions.  Plan for return home remains appropriate with support from spouse.  Pt with DOE during gait but unable to obtain accurate pulse ox reading.  Plan to continue education and activity during hospitalization.    Follow Up Recommendations  Supervision/Assistance - 24 hour;Home health PT     Equipment Recommendations  Other (comment)(rollator)    Recommendations for Other Services       Precautions / Restrictions Precautions Precautions: Fall;Sternal;Other (comment) Precaution Comments: requires min cues for sternal precautions Restrictions Weight Bearing Restrictions: No Other Position/Activity Restrictions: sternal precautions    Mobility  Bed Mobility Overal bed mobility: Needs Assistance Bed Mobility: Supine to Sit;Sit to Supine     Supine to sit: Supervision Sit to supine: Supervision   General bed mobility comments: Cues for sternal precautions.    Transfers Overall transfer level: Needs assistance Equipment used: 4-wheeled walker Transfers: Sit to/from Stand Sit to Stand: Supervision         General transfer comment: VCs for safety and hand placement on B knees.    Ambulation/Gait Ambulation/Gait assistance: Supervision Ambulation Distance (Feet): 600 Feet Assistive  device: 4-wheeled walker Gait Pattern/deviations: Step-through pattern;Decreased stride length;Trunk flexed Gait velocity: decr Gait velocity interpretation: Below normal speed for age/gender General Gait Details: Cues for upper trunk control, proximity to Rollator and pacing.  Pt on 3L-4L of SPO2 on [place].  Waveform poor during ambulation and unable to obtain accurate reading.  Pt above 90% during rest.     Stairs            Wheelchair Mobility    Modified Rankin (Stroke Patients Only)       Balance Overall balance assessment: Needs assistance   Sitting balance-Leahy Scale: Good       Standing balance-Leahy Scale: Fair Standing balance comment: supervision for static standing                            Cognition Arousal/Alertness: Awake/alert Behavior During Therapy: WFL for tasks assessed/performed Overall Cognitive Status: Within Functional Limits for tasks assessed Area of Impairment: Memory(Pt with in functional limit but remains to present with memory defictis to recall precautions and power source transfer.  )                               General Comments: Pt needs cues to sternal precautions and to switch from power module to batteries and back      Exercises      General Comments        Pertinent Vitals/Pain Pain Assessment: Faces Faces Pain Scale: Hurts little more Pain Location: sternal Pain Descriptors / Indicators: Grimacing;Sore Pain Intervention(s): Monitored during session;Repositioned    Home Living  Prior Function            PT Goals (current goals can now be found in the care plan section) Acute Rehab PT Goals Patient Stated Goal: To get stronger and go home  Potential to Achieve Goals: Good Progress towards PT goals: Progressing toward goals    Frequency    Min 3X/week      PT Plan Current plan remains appropriate    Co-evaluation              AM-PAC PT "6  Clicks" Daily Activity  Outcome Measure  Difficulty turning over in bed (including adjusting bedclothes, sheets and blankets)?: A Little Difficulty moving from lying on back to sitting on the side of the bed? : A Little Difficulty sitting down on and standing up from a chair with arms (e.g., wheelchair, bedside commode, etc,.)?: A Little Help needed moving to and from a bed to chair (including a wheelchair)?: A Little Help needed walking in hospital room?: A Little Help needed climbing 3-5 steps with a railing? : A Lot 6 Click Score: 17    End of Session Equipment Utilized During Treatment: Oxygen Activity Tolerance: Patient tolerated treatment well Patient left: with call bell/phone within reach;in bed Nurse Communication: Mobility status PT Visit Diagnosis: Unsteadiness on feet (R26.81);Muscle weakness (generalized) (M62.81);Difficulty in walking, not elsewhere classified (R26.2)     Time: 4627-0350 PT Time Calculation (min) (ACUTE ONLY): 51 min  Charges:  $Gait Training: 8-22 mins $Therapeutic Activity: 23-37 mins                    G CodesGovernor [name], PTA pager [phone]    [name] 05/12/2017, 1:22 PM

## 2017-05-12 NOTE — Progress Notes (Signed)
Exit site care performed:  Drive line exit wound care performed by wife and observed by VAD coordinator. Existing VAD dressing removed and site care performed using sterile technique.Gauze and silver have a scant amount of old bloody drainage. Drive line exit site cleaned with Chlora prep applicators x 2, allowed to dry, andgauzedressing withsilver stripre-applied. Exit sitehealingand unincorporated, the velour is fully implanted at exit site. Small amount of redness, slightly tender, no foul odor or rash noted.     Levada Dy, RN will change the dressing tomorrow and the wife will change the dressing on Sunday. The wife does not need to be observed on Sunday. She is doing a great job and even taught her daughter-April today.   Tanda Rockers RN, VAD Coordinator 24/7 pager [phone]

## 2017-05-12 NOTE — Progress Notes (Signed)
Patient ID: David [name], male   DOB: 1965-07-04, 52 y.o.   MRN: [id number] HeartMate 3 Rounding Note  Subjective:    Feels well, in good spirits waiting on breakfast.  Slept well    LVAD INTERROGATION:  HeartMate IIl LVAD:  Flow 5.0 liters/min, speed 5400, power 4, PI 4.5    Objective:    Vital Signs:   Temp:  [98.2 F (36.8 C)-98.6 F (37 C)] 98.2 F (36.8 C) (01/17 1917) Pulse Rate:  [76-85] 85 (01/18 0325) Resp:  [12-26] 17 (01/18 0325) BP: (61-119)/(48-104) 94/81 (01/18 0325) SpO2:  [93 %-99 %] 95 % (01/18 0325) Weight:  [74 kg (163 lb 2.3 oz)-76.8 kg (169 lb 5 oz)] 75.9 kg (167 lb 5.3 oz) (01/18 0525) Last BM Date: 05/05/17 Mean arterial Pressure 80's  Intake/Output:   Intake/Output Summary (Last 24 hours) at 05/12/2017 0800 Last data filed at 05/12/2017 0526 Gross per 24 hour  Intake 400 ml  Output 50 ml  Net 350 ml     Physical Exam: General:  Chronically ill-appearing but awake and alert, good spirits.   No resp difficulty Cor: intermittent heart sounds with LVAD hum present. Lungs: clear Abdomen: soft, nontender, nondistended. Good bowel sounds. Extremities: minimal bilateral LE edema. Unna boots removed and skin looks ok. Neuro: alert & orientedx3,  moves all 4 extremities w/o difficulty. Affect pleasant The sternotomy and abdominal incision are healing well.  Telemetry: sinus rhythm 80's  Labs: Basic Metabolic Panel: Recent Labs  Lab 05/08/17 0530  05/09/17 0224  05/10/17 0032 05/10/17 1552 05/11/17 0302 05/11/17 2136 05/12/17 0315  NA 130*   < > 130*   < > 133* 132* 130* 134* 134*  K 4.3   < > 4.3   < > 3.8 3.5 3.6 3.6 3.5  CL 94*   < > 95*   < > 96* 96* 93* 97* 96*  CO2 25   < > 24   < > 27 26 26 29 27   GLUCOSE 92   < > 129*   < > 103* 134* 100* 96 83  BUN 21*   < > 29*   < > 12 15 19 7 8   CREATININE 4.97*   < > 5.73*   < > 3.68* 4.54* 4.94* 2.87* 3.14*  CALCIUM 8.6*   < > 8.4*   < > 8.1* 8.2* 8.6* 8.2* 8.5*  MG 2.2  --  2.1  --  1.9   --  1.8  --  1.8  PHOS 5.7*   < > 6.5*   < > 4.1 4.2 5.9* 3.1 3.9   < > = values in this interval not displayed.    Liver Function Tests: Recent Labs  Lab 05/07/17 0456  05/10/17 0032 05/10/17 1552 05/11/17 0302 05/11/17 2136 05/12/17 0315  AST 45*  --   --   --   --   --   --   ALT 32  --   --   --   --   --   --   ALKPHOS 261*  --   --   --   --   --   --   BILITOT 3.2*  --   --   --   --   --   --   PROT 7.0  --   --   --   --   --   --   ALBUMIN 1.7*   < > 1.7* 1.6* 1.8* 1.7* 1.7*   < > =  values in this interval not displayed.   No results for input(s): LIPASE, AMYLASE in the last 168 hours. No results for input(s): AMMONIA in the last 168 hours.  CBC: Recent Labs  Lab 05/09/17 0350 05/10/17 0032 05/10/17 1236 05/11/17 0302 05/12/17 0315  WBC 10.6* 9.8 11.4* 11.2* 10.6*  HGB 7.8* 7.8* 7.9* 8.0* 8.1*  HCT 24.9* 24.6* 24.8* 25.6* 25.8*  MCV 83.6 84.2 84.1 84.5 84.9  PLT 324 306 324 342 347    INR: Recent Labs  Lab 05/08/17 0530 05/09/17 0224 05/10/17 0032 05/11/17 0302 05/12/17 0315  INR 1.43 1.49 1.63 1.99 1.97    Other results:  EKG:   Imaging: No results found.   Medications:     Scheduled Medications: . aspirin EC  81 mg Oral Daily  . atorvastatin  40 mg Oral q1800  . cephALEXin  500 mg Oral Q12H  . chlorhexidine  15 mL Mouth Rinse BID  . Chlorhexidine Gluconate Cloth  6 each Topical Daily  . citalopram  10 mg Oral Daily  . darbepoetin (ARANESP) injection - DIALYSIS  200 mcg Intravenous Q Thu-HD  . feeding supplement  1 Container Oral BID BM  . ferrous [id number] C-folic acid  1 capsule Oral BID BM  . gabapentin  100 mg Oral BID  . mouth rinse  15 mL Mouth Rinse q12n4p  . pantoprazole  40 mg Oral Daily  . polyethylene glycol  17 g Oral Daily  . Warfarin - Pharmacist Dosing Inpatient   Does not apply q1800    Infusions: . sodium chloride Stopped (04/30/17 0800)  . ferric gluconate (FERRLECIT/NULECIT) IV Stopped (05/11/17  1353)    PRN Medications: levalbuterol, ondansetron (ZOFRAN) IV, oxyCODONE, simethicone, sodium chloride flush, sodium chloride flush   Assessment:   1. Ischemic cardiomyopathy s/p multiple PCI's with chronic systolic heart failure and EF 20% on home dobutamine preop with significant improvement in symptoms. Evaluated at [facility] for heart transplant but LVAD felt to be the best treatment for him at this time. POD23s/p HeartMate III LVAD. He was critically ill with multi-system organ failure but has continued to recover and only issue now is renal failure requiring HD. Hemodynamics and VAD parameters very stable 2.H/OHypertension 3.H/OHyperlipidemia 4. PAF in sinuspreopon amiodarone and Eliquis.Now back in sinus off amiodarone.  5.H/OStage III CKD. Acute on chronic postop renal failureon intermittent HD. Making some urine but creat continues to rise. Had HD yesterday. Still hopeful for recovery.  6. LBBB s/p CRT-D upgrade in 10/2016 without improvement in symptoms or EF. LV epicardial leads cut off at the time of surgery. 7. Prior smoking with mild obstruction and severe diffusion defect on PFT's. Continue IS. 8. Postop leukocytosis and fever. WBC normal.  Started on Keflex for some redness around the drive line site. 9. Marked rise in bilirubin and transaminases. Likely due to pump runand right sided congestion, hypotension.Resolved   10. INR is1.97 today. Coumadin per pharmacy.     11. Severe malnutrition with albumin 1.7. He is eating well now.  Continue to encourage nutrition.    12. PT    13. Anemia: follow. He is on iron.     14. Jerking movements of unclear etiology but I wonder if related to Celexa or Neurontin, especially with renal failure.      Plan/Discussion:    Overall he has been  Very stable. MAP in good range off pressors.  VAD flow has been great.  Intermittent HD.  Continue exit site dressings and Keflex for erythema around drive  line site. I think this is  more slow healing than infection.  Continue nutrition, ambulation, IS. Please do not put Unna boots on his legs. His skin is fine and Unna boots may lead to maceration and infection. They are not meant for normal skin. I reviewed the LVAD parameters from today, and compared the results to the patient's prior recorded data.  No programming changes were made.  The LVAD is functioning within specified parameters.   LVAD interrogation was negative for any significant power changes, alarms or PI events/speed drops.  LVAD equipment check completed and is in good working order.  Back-up equipment present.   LVAD education done on emergency procedures and precautions and reviewed exit site care.  Length of Stay: [address]  Fernande Boyden [facility] 05/12/2017, 8:00 AM

## 2017-05-12 NOTE — Progress Notes (Signed)
Bilateral Upper Extremity Vein Map  Right Cephalic  Segment Diameter Depth Comment  Axilla 3.3 mm 7.1 mm   1. Prox 2.2 mm 3.8 mm   2. Mid upper arm 1.3 mm 2.5 mm  branch  3. Above AC 1.7 mm 2.6 mm   4. In AC 2.6 mm 2.3 mm   5. Below AC 1.7 mm 2.7 mm   6. Mid forearm 2.6 mm 2.8 mm   7. distal 1.2 mm 2.1 mm    Left Cephalic  Segment Diameter Depth Comment  Axilla 2.8 mm 9.3 mm   1. Prox 2.4 mm 4.3 mm   2. Mid upper arm 1.7 mm 2.9 mm   3. Above AC 2.5 mm 3.6 mm  branch  4. In AC 2.1 mm 1.8 mm   5. Below AC 2.4 mm 3.0 mm   6. Mid forearm 1.6 mm 2.4 mm   7. distal 1.4 mm 2.4 mm    Lita Mains- RDMS, RVT 3:34 PM  05/12/2017

## 2017-05-12 NOTE — Progress Notes (Signed)
LVAD Coordinator Rounding Note:  Admitted12/26due to decompensated heart failure.  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   Vital signs: Tmax: 98.5 HR:82 Doppler Pressure:  not done Automatic cuff:  90/44(80) O2 Sat:97 % on RA Wt:165>164>156>160>160>154>158>156>157>160>164>167>169>167>168>167lbs  LVAD interrogation reveals:  Speed:5400 Flow: 5.1 Power:4.0 w PI:3.2 Alarms:none Events:none Hematocrit:26 Fixed speed:5400 Low speed limit:5100  Labs:  LDH trend:205>407>576>923>967>612>528>492>467>489>441>434>405>376>355>315>265>263>281>293  INR trend:1.34>1.52>1.73>1.95>2.32>3.03>3.80>3.16>2.39>2.23>1.70>1.65>1.91>1.60>1.43>1.49>1.63>1.99>1.97  Anticoagulation Plan: -INR Goal:2-2.5 -ASA Dose: 81 mg   Blood Products: Intra op:2 units/FFP 04/20/17  Post op: PRBC's 1 unit 12/29 1 unit 12/30 1 unit 04/25/17 1 unit 05/02/17 1 unit 05/08/17  Device:Medtronic BiV -Therapies:on   Arrhythmia: 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 (on amiodarone and Eliquis for this) - Afib 12/29 - 04/28/17 - NSR 1/5 - present  Nitric Oxide: -  stopped on 12/30 due to initiation of BiPap  Respiratory: - Extubated 12/28 - Reintubated 04/23/2017 - Extubated 04/26/2017  Renal:  04/23/17 - CRRT started for AKI - stopped 05/05/17 -continues to receive int. 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 VAD coordinator will change dressing this afternoon with his wife at 1330.  Wife will change the drive line dressing this weekend.  2. Please call VAD  Pager for any equipment concerns or drive line dressing questions/concerns.  Tanda Rockers RN, VAD Coordinator 24/7 pager [phone]

## 2017-05-12 NOTE — Plan of Care (Signed)
Pt is able to stand bedside to use the urinal. Stand by assist only. Pt encouraged to increase mobility. Will continue to monitor for progress.

## 2017-05-12 NOTE — Progress Notes (Signed)
[place] for Coumadin/Heparin Indication: LVAD  Allergies  Allergen Reactions  . Plavix [Clopidogrel Bisulfate] Hives   Patient Measurements: Height: 5\' 5"  (165.1 cm) Weight: 167 lb 5.3 oz (75.9 kg) IBW/kg (Calculated) : 61.5  Vital Signs: Temp: 98.3 F (36.8 C) (01/18 1214) Temp Source: Oral (01/18 1214) BP: 91/66 (01/18 1214) Pulse Rate: 76 (01/18 1214)  Labs: Recent Labs    05/10/17 0032 05/10/17 1236  05/11/17 0302 05/11/17 2136 05/12/17 0315  HGB 7.8* 7.9*  --  8.0*  --  8.1*  HCT 24.6* 24.8*  --  25.6*  --  25.8*  PLT 306 324  --  342  --  347  LABPROT 19.2*  --   --  22.4*  --  22.3*  INR 1.63  --   --  1.99  --  1.97  HEPARINUNFRC 0.26* 0.48  --  0.67  --   --   CREATININE 3.68*  --    < > 4.94* 2.87* 3.14*   < > = values in this interval not displayed.   Estimated Creatinine Clearance: 25.9 mL/min (A) (by C-G formula based on SCr of 3.14 mg/dL (H)).  Assessment: 53 yo male s/p LVAD implantation 12/27.   IV heparin and Coumadin on hold for tunneled catheter 1/15.  Also with bleeding from driveline site.  Driveline site bleeding improved Heparin drip stopped 1/17 INR 2 - will boost warfarin dose to keep above 2  CBC stable. Starting to eat better and feel stronger.  Goal of Therapy:  INR 2-2.5 Monitor platelets by anticoagulation protocol: Yes    Plan: Coumadin 5 mg po x1 Daily PT/INR. Keflex 500mg  bid started 1/17 for driveline infection  Bonnita Nasuti Pharm.D. CPP, BCPS Clinical Pharmacist [phone] 05/12/2017 1:12 PM

## 2017-05-12 NOTE — Progress Notes (Signed)
[facility] Progress Note    Assessment/ Plan:   1.  AKI on CKD III--> ESRD: likely due to ATN from shock s/p LVAD placement with underlying arterionephrosclerosis.  Required CRRT initially and now has been transitioned to IHD (1/11).  Last IHD 1/15.  S/p TDC with IR 05/08/17.  UOP OK but no real signs of GFR recovery yet- will plan on vein mapping and perm access placement some time next week.  He will likely require a graft as a fistula is not likely to mature with non-pulsatile flow.     2.  Acute on chronic systolic CHF: s/p Heartmate III 12/27.  Advanced HF and cardiothoracic surgery following.  He required a stitch for driveline bleeding 1/61.  Coumadin has been restarted (1/14)   3.  Elevated LFTs: shock liver, nearly resolved.  4.  Hyponatremia: vol related,  Improving.  5.  Achromobacter pneumonia: s/p antibiotics (ended 1/13, 10 days)  Subjective:    Asterixis a little improved today, for HD tomorrow.     Objective:   BP (!) 90/44 (BP Location: Left Arm)   Pulse (!) 28   Temp 98.3 F (36.8 C) (Oral)   Resp 19   Ht 5\' 5"  (1.651 m)   Wt 75.9 kg (167 lb 5.3 oz)   SpO2 97%   BMI 27.85 kg/m   Intake/Output Summary (Last 24 hours) at 05/12/2017 1134 Last data filed at 05/12/2017 0900 Gross per 24 hour  Intake 360 ml  Output -75 ml  Net 435 ml   Weight change: -2.34 kg (-2.6 oz)  Physical Exam: WRU:EAVWU gentleman, NAD, lying in bed HEENT: some JVD upright CVS: mechanical hum of LVAD Resp: normal WOB today, clear anteriorly, some muffled bilateral bses Abd: + drive line LUQ, picture reviewed in Epic- no further bleeding Ext:1+ LE edema ACCESS: R IJ TDC In place NEURO: mild asterixis, slightly improved  Imaging: No results found.  Labs: BMET Recent Labs  Lab 05/09/17 0224 05/09/17 1605 05/10/17 0032 05/10/17 1552 05/11/17 0302 05/11/17 2136 05/12/17 0315  NA 130* 131* 133* 132* 130* 134* 134*  K 4.3 3.5 3.8 3.5 3.6 3.6 3.5  CL 95* 95*  96* 96* 93* 97* 96*  CO2 24 26 27 26 26 29 27   GLUCOSE 129* 131* 103* 134* 100* 96 83  BUN 29* 10 12 15 19 7 8   CREATININE 5.73* 3.22* 3.68* 4.54* 4.94* 2.87* 3.14*  CALCIUM 8.4* 7.9* 8.1* 8.2* 8.6* 8.2* 8.5*  PHOS 6.5* 3.2 4.1 4.2 5.9* 3.1 3.9   CBC Recent Labs  Lab 05/10/17 0032 05/10/17 1236 05/11/17 0302 05/12/17 0315  WBC 9.8 11.4* 11.2* 10.6*  HGB 7.8* 7.9* 8.0* 8.1*  HCT 24.6* 24.8* 25.6* 25.8*  MCV 84.2 84.1 84.5 84.9  PLT 306 324 342 347    Medications:    . aspirin EC  81 mg Oral Daily  . atorvastatin  40 mg Oral q1800  . cephALEXin  500 mg Oral Q12H  . chlorhexidine  15 mL Mouth Rinse BID  . Chlorhexidine Gluconate Cloth  6 each Topical Daily  . citalopram  10 mg Oral Daily  . darbepoetin (ARANESP) injection - DIALYSIS  200 mcg Intravenous Q Thu-HD  . feeding supplement  1 Container Oral BID BM  . ferrous [id number] C-folic acid  1 capsule Oral BID BM  . gabapentin  100 mg Oral BID  . mouth rinse  15 mL Mouth Rinse q12n4p  . pantoprazole  40 mg Oral Daily  . polyethylene glycol  17 g Oral Daily  . Warfarin - Pharmacist Dosing Inpatient   Does not apply [place], MD [facility] pgr [phone] 05/12/2017, 11:34 AM

## 2017-05-12 NOTE — Progress Notes (Signed)
Pt. Is voiding, discussed with PA claimed to hold on doing bladder scan.

## 2017-05-12 NOTE — Progress Notes (Signed)
Unna boots removed as verbal order of DR. Bartle.claimed not to put any dressing.

## 2017-05-13 LAB — RENAL FUNCTION PANEL
ANION GAP: 10 (ref 5–15)
Albumin: 1.8 g/dL — ABNORMAL LOW (ref 3.5–5.0)
Albumin: 1.8 g/dL — ABNORMAL LOW (ref 3.5–5.0)
Anion gap: 9 (ref 5–15)
BUN: 12 mg/dL (ref 6–20)
CALCIUM: 8.8 mg/dL — AB (ref 8.9–10.3)
CO2: 27 mmol/L (ref 22–32)
CO2: 28 mmol/L (ref 22–32)
Calcium: 8.3 mg/dL — ABNORMAL LOW (ref 8.9–10.3)
Chloride: 95 mmol/L — ABNORMAL LOW (ref 101–111)
Chloride: 97 mmol/L — ABNORMAL LOW (ref 101–111)
Creatinine, Ser: 4 mg/dL — ABNORMAL HIGH (ref 0.61–1.24)
Creatinine, Ser: 2.08 mg/dL — ABNORMAL HIGH (ref 0.61–1.24)
GFR calc Af Amer: 18 mL/min — ABNORMAL LOW (ref >60)
GFR calc Af Amer: 40 mL/min — ABNORMAL LOW (ref >60)
GFR calc non Af Amer: 16 mL/min — ABNORMAL LOW (ref >60)
GFR, EST NON AFRICAN AMERICAN: 35 mL/min — AB (ref >60)
Glucose, Bld: 97 mg/dL (ref 65–99)
Glucose, Bld: 118 mg/dL — ABNORMAL HIGH (ref 65–99)
POTASSIUM: 3.3 mmol/L — AB (ref 3.5–5.1)
Phosphorus: 4.7 mg/dL — ABNORMAL HIGH (ref 2.5–4.6)
Phosphorus: 1.9 mg/dL — ABNORMAL LOW (ref 2.5–4.6)
Potassium: 3.5 mmol/L (ref 3.5–5.1)
SODIUM: 132 mmol/L — AB (ref 135–145)
Sodium: 134 mmol/L — ABNORMAL LOW (ref 135–145)

## 2017-05-13 LAB — LACTATE DEHYDROGENASE: LDH: 273 U/L — AB (ref 98–192)

## 2017-05-13 LAB — PROTIME-INR
INR: 2.52
PROTHROMBIN TIME: 26.9 s — AB (ref 11.4–15.2)

## 2017-05-13 LAB — CBC
HCT: 26.7 % — ABNORMAL LOW (ref 39.0–52.0)
HEMOGLOBIN: 8.4 g/dL — AB (ref 13.0–17.0)
MCH: 26.8 pg (ref 26.0–34.0)
MCHC: 31.5 g/dL (ref 30.0–36.0)
MCV: 85.3 fL (ref 78.0–100.0)
PLATELETS: 340 10*3/uL (ref 150–400)
RBC: 3.13 MIL/uL — AB (ref 4.22–5.81)
RDW: 19.6 % — ABNORMAL HIGH (ref 11.5–15.5)
WBC: 13.4 10*3/uL — ABNORMAL HIGH (ref 4.0–10.5)

## 2017-05-13 LAB — MAGNESIUM: Magnesium: 1.8 mg/dL (ref 1.7–2.4)

## 2017-05-13 MED ORDER — SORBITOL 70 % SOLN
30.0000 mL | Freq: Once | Status: AC
  Administered 2017-05-13: 30 mL via ORAL
  Filled 2017-05-13: qty 30

## 2017-05-13 MED ORDER — OXYCODONE HCL 5 MG PO TABS
ORAL_TABLET | ORAL | Status: AC
  Filled 2017-05-13: qty 2

## 2017-05-13 MED ORDER — WARFARIN SODIUM 2.5 MG PO TABS
2.5000 mg | ORAL_TABLET | Freq: Once | ORAL | Status: AC
  Administered 2017-05-13: 2.5 mg via ORAL
  Filled 2017-05-13: qty 1

## 2017-05-13 MED ORDER — GABAPENTIN 100 MG PO CAPS
200.0000 mg | ORAL_CAPSULE | Freq: Two times a day (BID) | ORAL | Status: DC
  Administered 2017-05-13 – 2017-05-21 (×17): 200 mg via ORAL
  Filled 2017-05-13 (×17): qty 2

## 2017-05-13 MED ORDER — TORSEMIDE 20 MG PO TABS
60.0000 mg | ORAL_TABLET | Freq: Two times a day (BID) | ORAL | Status: DC
  Administered 2017-05-13 – 2017-05-19 (×12): 60 mg via ORAL
  Filled 2017-05-13 (×12): qty 3

## 2017-05-13 MED ORDER — OXYCODONE HCL 5 MG PO TABS
5.0000 mg | ORAL_TABLET | Freq: Four times a day (QID) | ORAL | Status: DC | PRN
  Administered 2017-05-13 (×2): 10 mg via ORAL
  Administered 2017-05-14 – 2017-05-15 (×5): 5 mg via ORAL
  Filled 2017-05-13: qty 2
  Filled 2017-05-13 (×5): qty 1
  Filled 2017-05-13: qty 2

## 2017-05-13 NOTE — Progress Notes (Signed)
Occupational Therapy Treatment Patient Details Name: David [name] MRN: [id number] DOB: 05-12-1965 Today's Date: 05/13/2017    History of present illness This 52 y.o. male admitted for LVAD. Pt extubated 12/28.   on 12/30, pt developed respiratory distress with pulmonary edema, and increased creatanine.  He was intubated 12/30 > 04/26/16, and CRRT initiated 12/30 and discontinued 1/10. Pt now on intermittent HD.   PMH includes:  Acute on chronic systolic HF, CAD, PAF, CKD stage III, LBBB, frequent PVCs, seizures, PNS, MI, ischemic cardiomyopathy, s/p AICD    OT comments  This 52 yo male admitted and underwent above seen today to address management of his LVAD equipment. He still is having issues with remember all of the steps and being able to complete all the steps. He will continue to benefit from acute OT with follow up [place].  Follow Up Recommendations  Home health OT;Supervision/Assistance - 24 hour    Equipment Recommendations  Tub/shower seat       Precautions / Restrictions Precautions Precautions: Fall;Sternal Restrictions Weight Bearing Restrictions: No Other Position/Activity Restrictions: sternal precautions                               Cognition Arousal/Alertness: Awake/alert Behavior During Therapy: WFL for tasks assessed/performed                                   General Comments: pt in hemodialysis but able to work on switching from battery<>AC power while supine in bed with HOB up. Pt able to tell me he when he was going out that he needed his bag and extra batteries, but did not remember he needed battery clips or extra controller. When changing from [facility] to battery for both cords he tired to plug the the [facility] cord in to the battery instead of the LVAD cord. For the second change from [facility] to battery he could not get the semicircles lined up to get them to connect, needed A. Also could not get one of the cords unscrewed to switch to battery. He did  recall when placing the battery into the clips that he need to check the battery charge.                   Pertinent Vitals/ Pain       Pain Assessment: No/denies pain         Frequency    3xwk          Plan Discharge plan remains appropriate       AM-PAC PT "6 Clicks" Daily Activity     Outcome Measure   Help from another person eating meals?: None Help from another person taking care of personal grooming?: A Little Help from another person toileting, which includes using toliet, bedpan, or urinal?: A Little Help from another person bathing (including washing, rinsing, drying)?: A Little Help from another person to put on and taking off regular upper body clothing?: A Little Help from another person to put on and taking off regular lower body clothing?: A Little 6 Click Score: 19    End of Session    OT Visit Diagnosis: Unsteadiness on feet (R26.81);Other symptoms and signs involving cognitive function   Activity Tolerance Patient tolerated treatment well   Patient Left in bed;with call bell/phone within reach;with nursing/sitter in room  Time: 8295-6213 OT Time Calculation (min): 20 min  Charges: OT General Charges $OT Visit: 1 Visit OT Treatments $Self Care/Home Management : 8-22 mins  [place], OTR/L [phone] 05/13/2017

## 2017-05-13 NOTE — Procedures (Signed)
Patient seen and examined on Hemodialysis. QB 400 via R IJ TDC, UF goal 2L as tolerated.  Doing well.  Looks better.    Treatment adjusted as needed.  Madelon Lips MD [facility] pgr [phone] 4:51 PM

## 2017-05-13 NOTE — Progress Notes (Signed)
Exit site care performed:  Drive line exit wound care performed . Existing VAD dressing removed and site care performed using sterile technique.Gauze and silverhave a scant amount of old bloody drainage.Drive line exit site cleaned with Chlora prep applicators x 2, allowed to dry, andgauzedressing withsilver stripre-applied. Exit sitehealingand unincorporated, the velour is fully implanted at exit site. Small amount of redness, slightly tender, no foul odor or rash noted.   David [name]

## 2017-05-13 NOTE — Progress Notes (Addendum)
Patient ID: David [name], male   DOB: 09/20/1965, 52 y.o.   MRN: [id number]   Advanced Heart Failure VAD Team Note  Subjective:    David [name] is a 52 y.o. male with a PMH of ICM ('09 PCI to LAD, mRCA, '10 PCI to Lcx and '12 mRCA), HFrEF (EF ~20% for >5 years), HLD and HTN admitted 12/26 for VAD placement due to end-stage systolic HF.   - Underwent successful HM-3 LVAD placement 04/20/17. 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 CVVHD started 12/30 - Started on Meropenem 04/27/16 with + Achromobacter in tracheal aspirate. (Finished 05/07/17) - CVVHD stopped 1/10 - 1st iHD 1/12 - Tunneled HD cath placed 05/08/17 - Started on Keflex for mild drainage from [place] 10/06/41  Doing "ok" today. Still c/o pain in chest.  Made ~ 700cc of urine yesterday. Weight stable. Plans for HD today. Walking some in halls. MAPs stable. No dizziness.    Peripheral edema much improved. Denies SOB walking halls with PT. No lightheadedness or dizziness after MAPs improved yesterday. No BM since Monday. HGb 8.4  INR 2.5 LDH 273  Echo 12/31 reviewed: No significant pericardial effusion, IV septum midline to slightly to the right, RV function adequate.  LVAD INTERROGATION:  HeartMate III LVAD:  Flow 4.0 liters/min, speed 5400, power 5.0, PI 4.2, Occasional PI events.   Objective:    Vital Signs:   Temp:  [97.9 F (36.6 C)-98.9 F (37.2 C)] 98.9 F (37.2 C) (01/19 0809) Pulse Rate:  [76-93] 79 (01/19 0345) Resp:  [13-21] 17 (01/19 0809) BP: (91-103)/(56-84) 103/84 (01/19 0809) SpO2:  [94 %-98 %] 98 % (01/19 0809) Weight:  [75.6 kg (166 lb 10.7 oz)] 75.6 kg (166 lb 10.7 oz) (01/19 0540) Last BM Date: 05/10/17 Mean arterial Pressure 70-80s  Intake/Output:   Intake/Output Summary (Last 24 hours) at 05/13/2017 1002 Last data filed at 05/12/2017 2058 Gross per 24 hour  Intake 220 ml  Output 675 ml  Net -455 ml   Physical Exam   Physical Exam:  General:  Lying in bed. Weak appearing. NAD.    HEENT: normal edentulous Neck: supple. JVP to jaw.  Carotids 2+ bilat; no bruits. No lymphadenopathy or thryomegaly appreciated. Cor: LVAD hum. Tunneled cath Lungs: Clear decreased at bases  Abdomen: obese soft, nontender, non-distended. No hepatosplenomegaly. No bruits or masses. Good bowel sounds. Driveline site clean. Anchor in place.  Extremities: no cyanosis, clubbing, rash. Warm no 1-2+ edema  Neuro: alert & oriented x 3. No focal deficits. Moves all 4 without problem    Telemetry   NSR 70-80s, Personally reviewed   Labs   Basic Metabolic Panel: Recent Labs  Lab 05/09/17 0224  05/10/17 0032  05/11/17 0302 05/11/17 2136 05/12/17 0315 05/12/17 1517 05/13/17 0332  NA 130*   < > 133*   < > 130* 134* 134* 132* 132*  K 4.3   < > 3.8   < > 3.6 3.6 3.5 3.6 3.5  CL 95*   < > 96*   < > 93* 97* 96* 97* 95*  CO2 24   < > 27   < > 26 29 27 27 27   GLUCOSE 129*   < > 103*   < > 100* 96 83 125* 97  BUN 29*   < > 12   < > 19 7 8 9 12   CREATININE 5.73*   < > 3.68*   < > 4.94* 2.87* 3.14* 3.70* 4.00*  CALCIUM 8.4*   < > 8.1*   < >  8.6* 8.2* 8.5* 8.5* 8.8*  MG 2.1  --  1.9  --  1.8  --  1.8  --  1.8  PHOS 6.5*   < > 4.1   < > 5.9* 3.1 3.9 3.5 4.7*   < > = values in this interval not displayed.    Liver Function Tests: Recent Labs  Lab 05/07/17 0456  05/11/17 0302 05/11/17 2136 05/12/17 0315 05/12/17 1517 05/13/17 0332  AST 45*  --   --   --   --   --   --   ALT 32  --   --   --   --   --   --   ALKPHOS 261*  --   --   --   --   --   --   BILITOT 3.2*  --   --   --   --   --   --   PROT 7.0  --   --   --   --   --   --   ALBUMIN 1.7*   < > 1.8* 1.7* 1.7* 1.7* 1.8*   < > = values in this interval not displayed.   No results for input(s): LIPASE, AMYLASE in the last 168 hours. No results for input(s): AMMONIA in the last 168 hours.  CBC: Recent Labs  Lab 05/10/17 0032 05/10/17 1236 05/11/17 0302 05/12/17 0315 05/13/17 0332  WBC 9.8 11.4* 11.2* 10.6* 13.4*  HGB  7.8* 7.9* 8.0* 8.1* 8.4*  HCT 24.6* 24.8* 25.6* 25.8* 26.7*  MCV 84.2 84.1 84.5 84.9 85.3  PLT 306 324 342 347 340    INR: Recent Labs  Lab 05/09/17 0224 05/10/17 0032 05/11/17 0302 05/12/17 0315 05/13/17 0332  INR 1.49 1.63 1.99 1.97 2.52    Other results:  Imaging   No results found.   Medications:     Scheduled Medications: . aspirin EC  81 mg Oral Daily  . atorvastatin  40 mg Oral q1800  . cephALEXin  500 mg Oral Q12H  . chlorhexidine  15 mL Mouth Rinse BID  . Chlorhexidine Gluconate Cloth  6 each Topical Daily  . citalopram  10 mg Oral Daily  . darbepoetin (ARANESP) injection - DIALYSIS  200 mcg Intravenous Q Thu-HD  . feeding supplement  1 Container Oral BID BM  . ferrous [id number] C-folic acid  1 capsule Oral BID BM  . gabapentin  100 mg Oral BID  . mouth rinse  15 mL Mouth Rinse q12n4p  . pantoprazole  40 mg Oral Daily  . polyethylene glycol  17 g Oral Daily  . Warfarin - Pharmacist Dosing Inpatient   Does not apply q1800    Infusions: . sodium chloride Stopped (04/30/17 0800)  . ferric gluconate (FERRLECIT/NULECIT) IV Stopped (05/11/17 1353)    PRN Medications: levalbuterol, ondansetron (ZOFRAN) IV, oxyCODONE, simethicone, sodium chloride flush, sodium chloride flush   Patient Profile   David [name] is a 53 y.o. male with a PMH of ICM ('09 PCI to LAD, mRCA, '10 PCI to Lcx and '12 mRCA), HFrEF (EF ~20% for >5 years) s/p HM-III VAD placement on 12/27 due to end-stage systolic HF.   Assessment/Plan:    1. Acute on chronic systolic HF: Echo 83/66/29 with LVEF 20-25%, Mild MR, Mild/Mod MR, Severe LAE, Mildly reduced RV, PA peak pressure 56 mm Hg.  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 Extubated, initially stopped epinephrine/norepinephrine but restarted with fall in UOP and MAP.  With  progressive renal failure, pulmonary edema, and hypotension, he was re-intubated on 12/30.   CVVH was begun.  12/31 was  difficult day with refractory shock (MAP in 60s).  Reviewed 12/31 echo which showed no significant pericardial effusion, adequate RV function, and IV septum near mid-line.  Off NE/Epi.  - Off dobutamine. Central line pulled.  - Urine output 500-1000cc/day but no meaningful clearance. - Volume status rising again off HD. Will need HD today.  - Continue aspirin 81 mg daily.  - Warfarin goal INR 2-2.5. INR 2.5. Off heparin. - MAPs stable  2. CAD s/p PCI to mid RCA and PLOM with DES x 2 and DES to mid LAD in 1/18.   - Continue ASA and statin.  - No s/s ischemia 3. PAF: - Remains in NSR. Tele reviewed personally.  4. ARF on CKD stage III: Suspect some degree of intrinsic renal disease prior to LVAD placement (baseline creatinine around 1.5) with probable ATN from intra-op/peri-op hypotension and development of vasodilatory/septic shock.  CVVHD stopped 05/04/2017. First iHD 05/06/17 without problems.Tunneled HD cath placed 05/08/17.  - Urine output 500-1000cc/day but no meaningful clearance. - Volume status rising again off HD. Will need HD today.  - Vein mapping done plan for AV graft placement soon but is now on coumadin with INR 2.5. Will need to d/w Renal  5. Anemia: s/p 1 u PRBCs 12/30, 04/25/17, 1/8.  - Hgb 8.4 this am. Stable.  - Want to limit transfusion with possibility of transplant down the road. Follow closely.  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 ID: Started on cefepime empirically with septic/vasodilatory shock, PCT was 31 04/25/17.  ABX broadened to Meropenem 04/27/16 with + Achromobacter on tracheal aspirate.  - Finished 05/07/17 (10 days)  PCT 8.31 04/27/17 -> 3.87 ->0.94 - Afebrile.  - Back on Keflex as below with mild driveline drainage. Site stable. WBC climbing. Watch closely  7. Elevated LFTs due to shock liver - LFTs nearly normal. No change. 8. Hyponatremia:  - Stable at 132  9. Driveline bleeding - Resolved after stitch placed by Dr. Cyndia Bent.  10.  Constipation - limit narcotics - sorbitol after HD today 11. Pain - reduce oxycodone - increase gabapentin 200 bid   Length of Stay: [place], MD 05/13/2017, 10:02 AM  VAD Team --- VAD ISSUES ONLY--- Pager [phone] (7am - 7am)  Advanced Heart Failure Team  Pager [phone] (M-F; 7a - 4p)  Please contact [facility] for night-coverage after hours (4p -7a ) and weekends on [email or web address]

## 2017-05-13 NOTE — Progress Notes (Signed)
[facility] Progress Note    Assessment/ Plan:   1.  AKI on CKD III--> ESRD: likely due to ATN from shock s/p LVAD placement with underlying arterionephrosclerosis.  Required CRRT initially and now has been transitioned to IHD (1/11).  S/p TDC with IR 05/08/17.  UOP OK but no real signs of GFR recovery yet- will plan on vein mapping and perm access placement some time next week.  He will likely require a graft as a fistula is not likely to mature with non-pulsatile flow.   Next planned HD today 1/19.  2.  Acute on chronic systolic CHF: s/p Heartmate III 12/27.  Advanced HF and cardiothoracic surgery following.  He required a stitch for driveline bleeding 0/17.  Coumadin has been restarted (1/14).  On torsemide 60 BID to augment UOP- agree, this will help keep fluid gains down in between HD and prevent such large UF goals which could induce hypotension.  3.  Elevated LFTs: shock liver, nearly resolved.  4.  Hyponatremia: vol related,  Improving.  5.  Achromobacter pneumonia: s/p antibiotics (ended 1/13, 10 days)  6.  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  7.  Dispo: in SDU  Subjective:    Asterixis a little improved today, for HD tomorrow.     Objective:   BP 101/76 (BP Location: Left Arm)   Pulse 86   Temp 98.3 F (36.8 C) (Oral)   Resp (!) 25   Ht 5\' 5"  (1.651 m)   Wt 75.6 kg (166 lb 10.7 oz)   SpO2 96%   BMI 27.73 kg/m   Intake/Output Summary (Last 24 hours) at 05/13/2017 1207 Last data filed at 05/12/2017 2058 Gross per 24 hour  Intake 220 ml  Output 675 ml  Net -455 ml   Weight change: 1.6 kg (3 lb 8.4 oz)  Physical Exam: PZW:CHENI gentleman, NAD, lying in bed HEENT: some JVD upright CVS: mechanical hum of LVAD Resp: normal WOB today, clear anteriorly, some muffled bilateral bses Abd: + drive line LUQ, dressed Ext:1+ LE edema, improved ACCESS: R IJ TDC In place NEURO: mild asterixis, AAO x 3 and able to appropriately answer questions  Imaging: No  results found.  Labs: BMET Recent Labs  Lab 05/10/17 0032 05/10/17 1552 05/11/17 0302 05/11/17 2136 05/12/17 0315 05/12/17 1517 05/13/17 0332  NA 133* 132* 130* 134* 134* 132* 132*  K 3.8 3.5 3.6 3.6 3.5 3.6 3.5  CL 96* 96* 93* 97* 96* 97* 95*  CO2 27 26 26 29 27 27 27   GLUCOSE 103* 134* 100* 96 83 125* 97  BUN 12 15 19 7 8 9 12   CREATININE 3.68* 4.54* 4.94* 2.87* 3.14* 3.70* 4.00*  CALCIUM 8.1* 8.2* 8.6* 8.2* 8.5* 8.5* 8.8*  PHOS 4.1 4.2 5.9* 3.1 3.9 3.5 4.7*   CBC Recent Labs  Lab 05/10/17 1236 05/11/17 0302 05/12/17 0315 05/13/17 0332  WBC 11.4* 11.2* 10.6* 13.4*  HGB 7.9* 8.0* 8.1* 8.4*  HCT 24.8* 25.6* 25.8* 26.7*  MCV 84.1 84.5 84.9 85.3  PLT 324 342 347 340    Medications:    . aspirin EC  81 mg Oral Daily  . atorvastatin  40 mg Oral q1800  . cephALEXin  500 mg Oral Q12H  . chlorhexidine  15 mL Mouth Rinse BID  . Chlorhexidine Gluconate Cloth  6 each Topical Daily  . citalopram  10 mg Oral Daily  . darbepoetin (ARANESP) injection - DIALYSIS  200 mcg Intravenous Q Thu-HD  . feeding supplement  1 Container Oral  BID BM  . ferrous [id number] C-folic acid  1 capsule Oral BID BM  . gabapentin  200 mg Oral BID  . mouth rinse  15 mL Mouth Rinse q12n4p  . pantoprazole  40 mg Oral Daily  . polyethylene glycol  17 g Oral Daily  . torsemide  60 mg Oral BID  . Warfarin - Pharmacist Dosing Inpatient   Does not apply [place], MD [facility] pgr [phone] 05/13/2017, 12:07 PM

## 2017-05-13 NOTE — Progress Notes (Signed)
[facility] for Coumadin Indication: LVAD  Allergies  Allergen Reactions  . Plavix [Clopidogrel Bisulfate] Hives   Patient Measurements: Height: 5\' 5"  (165.1 cm) Weight: 165 lb 12.6 oz (75.2 kg) IBW/kg (Calculated) : 61.5  Vital Signs: Temp: 97.5 F (36.4 C) (01/19 1250) Temp Source: Oral (01/19 1250) BP: 83/54 (01/19 1400) Pulse Rate: 86 (01/19 1311)  Labs: Recent Labs    05/11/17 0302  05/12/17 0315 05/12/17 1517 05/13/17 0332  HGB 8.0*  --  8.1*  --  8.4*  HCT 25.6*  --  25.8*  --  26.7*  PLT 342  --  347  --  340  LABPROT 22.4*  --  22.3*  --  26.9*  INR 1.99  --  1.97  --  2.52  HEPARINUNFRC 0.67  --   --   --   --   CREATININE 4.94*   < > 3.14* 3.70* 4.00*   < > = values in this interval not displayed.   Estimated Creatinine Clearance: 20.2 mL/min (A) (by C-G formula based on SCr of 4 mg/dL (H)).  Assessment: 52 yo male s/p LVAD implantation 12/27.   Heparin drip stopped 1/17  INR up to 2.5 this morning after boosted dose last night. No overt bleeding noted, scant bleeding noted on dressing change. Will back down on warfarin dose tonight given rise.  CBC, LDH stable.  Starting to eat better and feel stronger.  Tolerating HD today.  Goal of Therapy:  INR 2-2.5 Monitor platelets by anticoagulation protocol: Yes    Plan: Coumadin 2.5 mg po x1 Daily PT/INR. Keflex 500mg  bid started 1/17 for driveline infection  Erin Hearing PharmD., BCPS Clinical Pharmacist 05/13/2017 2:49 PM

## 2017-05-14 LAB — RENAL FUNCTION PANEL
ALBUMIN: 1.7 g/dL — AB (ref 3.5–5.0)
Albumin: 1.7 g/dL — ABNORMAL LOW (ref 3.5–5.0)
Anion gap: 10 (ref 5–15)
Anion gap: 10 (ref 5–15)
BUN: 5 mg/dL — ABNORMAL LOW (ref 6–20)
BUN: 6 mg/dL (ref 6–20)
CALCIUM: 8.6 mg/dL — AB (ref 8.9–10.3)
CALCIUM: 8.5 mg/dL — AB (ref 8.9–10.3)
CHLORIDE: 97 mmol/L — AB (ref 101–111)
CO2: 28 mmol/L (ref 22–32)
CO2: 27 mmol/L (ref 22–32)
CREATININE: 2.93 mg/dL — AB (ref 0.61–1.24)
CREATININE: 3.2 mg/dL — AB (ref 0.61–1.24)
Chloride: 97 mmol/L — ABNORMAL LOW (ref 101–111)
GFR calc Af Amer: 27 mL/min — ABNORMAL LOW (ref >60)
GFR calc Af Amer: 24 mL/min — ABNORMAL LOW (ref >60)
GFR calc non Af Amer: 23 mL/min — ABNORMAL LOW (ref >60)
GFR calc non Af Amer: 21 mL/min — ABNORMAL LOW (ref >60)
GLUCOSE: 87 mg/dL (ref 65–99)
GLUCOSE: 70 mg/dL (ref 65–99)
PHOSPHORUS: 1.9 mg/dL — AB (ref 2.5–4.6)
Phosphorus: 2.7 mg/dL (ref 2.5–4.6)
Potassium: 3.4 mmol/L — ABNORMAL LOW (ref 3.5–5.1)
Potassium: 3.2 mmol/L — ABNORMAL LOW (ref 3.5–5.1)
SODIUM: 135 mmol/L (ref 135–145)
SODIUM: 134 mmol/L — AB (ref 135–145)

## 2017-05-14 LAB — CBC
HCT: 26.2 % — ABNORMAL LOW (ref 39.0–52.0)
HEMOGLOBIN: 7.9 g/dL — AB (ref 13.0–17.0)
MCH: 26 pg (ref 26.0–34.0)
MCHC: 30.2 g/dL (ref 30.0–36.0)
MCV: 86.2 fL (ref 78.0–100.0)
Platelets: 322 10*3/uL (ref 150–400)
RBC: 3.04 MIL/uL — ABNORMAL LOW (ref 4.22–5.81)
RDW: 19.8 % — AB (ref 11.5–15.5)
WBC: 14.1 10*3/uL — AB (ref 4.0–10.5)

## 2017-05-14 LAB — MAGNESIUM: MAGNESIUM: 1.6 mg/dL — AB (ref 1.7–2.4)

## 2017-05-14 LAB — PROTIME-INR
INR: 3.72
Prothrombin Time: 36.6 seconds — ABNORMAL HIGH (ref 11.4–15.2)

## 2017-05-14 LAB — LACTATE DEHYDROGENASE: LDH: 247 U/L — AB (ref 98–192)

## 2017-05-14 NOTE — Plan of Care (Signed)
Continue current care plan 

## 2017-05-14 NOTE — Progress Notes (Signed)
Wife changed LVAD dressing independently, demonstrating it to her daugther. No cueing needed, did very well at keeping sterile technique.

## 2017-05-14 NOTE — Progress Notes (Signed)
Bladder scanned pt. For 569cc, pt refused to go to bathroom but wants to wait till after game done.

## 2017-05-14 NOTE — Progress Notes (Signed)
Patient ID: David [name], male   DOB: [birth year], 52 y.o.   MRN: [id number]   Advanced Heart Failure VAD Team Note  Subjective:    [name] is a 52 y.o. male with a PMH of ICM ('09 PCI to LAD, mRCA, '10 PCI to Lcx and '12 mRCA), HFrEF (EF ~20% for >5 years), HLD and HTN admitted 12/26 for VAD placement due to end-stage systolic HF.   - Underwent successful HM-3 LVAD placement 04/20/17. 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 CVVHD started 12/30 - Started on Meropenem 04/27/16 with + Achromobacter in tracheal aspirate. (Finished 05/07/17) - CVVHD stopped 1/10 - 1st iHD 1/12 - Tunneled HD cath placed 05/08/17 - Started on Keflex for mild drainage from Driveline 0/86/57  Had uneventful HD yesterday. Weight down a pound. Feels ok. Still with some chest soreness. No SOB. Appetite ok. No bleeding. No fevers or chills. Albumin 1.7. MAPs 90-100. Urine output ~ 800cc   INR 3.7 LDH 247  Echo 12/31 reviewed: No significant pericardial effusion, IV septum midline to slightly to the right, RV function adequate.  LVAD INTERROGATION:  HeartMate III LVAD:  Flow 4.0 liters/min, speed 5400, power 4.0, PI 2.6, Occasional PI events.   Objective:    Vital Signs:   Temp:  [97.5 F (36.4 C)-98.3 F (36.8 C)] 97.8 F (36.6 C) (01/19 1736) Pulse Rate:  [74-91] 80 (01/20 0346) Resp:  [14-25] 16 (01/20 0346) BP: (83-115)/(41-101) 102/41 (01/19 2320) SpO2:  [92 %-100 %] 96 % (01/20 0346) Weight:  [73 kg (160 lb 15 oz)-75.2 kg (165 lb 12.6 oz)] 73 kg (160 lb 15 oz) (01/20 0350) Last BM Date: 05/14/17 Mean arterial Pressure 80-90s  Intake/Output:   Intake/Output Summary (Last 24 hours) at 05/14/2017 0826 Last data filed at 05/13/2017 2351 Gross per 24 hour  Intake -  Output 2357 ml  Net -2357 ml   Physical Exam   Physical Exam: General:  lying in bed weak   HEENT: normal edentulous Neck: supple. JVP 8-9 Carotids 2+ bilat; no bruits. No lymphadenopathy or thryomegaly appreciated. Cor:  LVAD hum. + left chest tunneled cath  Lungs: Clear. Abdomen: obese soft, nontender, non-distended. No hepatosplenomegaly. No bruits or masses. Good bowel sounds. Driveline site clean. Anchor in place.  Extremities: no cyanosis, clubbing, rash. 2+ edema  Neuro: alert & oriented x 3. No focal deficits. Moves all 4 without problem    Telemetry   NSR 80s, Personally reviewed    Labs   Basic Metabolic Panel: Recent Labs  Lab 05/10/17 0032  05/11/17 0302  05/12/17 0315 05/12/17 1517 05/13/17 0332 05/13/17 1828 05/14/17 0646  NA 133*   < > 130*   < > 134* 132* 132* 134* 135  K 3.8   < > 3.6   < > 3.5 3.6 3.5 3.3* 3.4*  CL 96*   < > 93*   < > 96* 97* 95* 97* 97*  CO2 27   < > 26   < > 27 27 27 28 28   GLUCOSE 103*   < > 100*   < > 83 125* 97 118* 87  BUN 12   < > 19   < > 8 9 12  <5* 5*  CREATININE 3.68*   < > 4.94*   < > 3.14* 3.70* 4.00* 2.08* 2.93*  CALCIUM 8.1*   < > 8.6*   < > 8.5* 8.5* 8.8* 8.3* 8.6*  MG 1.9  --  1.8  --  1.8  --  1.8  --  1.6*  PHOS 4.1   < > 5.9*   < > 3.9 3.5 4.7* 1.9* 2.7   < > = values in this interval not displayed.    Liver Function Tests: Recent Labs  Lab 05/12/17 0315 05/12/17 1517 05/13/17 0332 05/13/17 1828 05/14/17 0646  ALBUMIN 1.7* 1.7* 1.8* 1.8* 1.7*   No results for input(s): LIPASE, AMYLASE in the last 168 hours. No results for input(s): AMMONIA in the last 168 hours.  CBC: Recent Labs  Lab 05/10/17 1236 05/11/17 0302 05/12/17 0315 05/13/17 0332 05/14/17 0646  WBC 11.4* 11.2* 10.6* 13.4* 14.1*  HGB 7.9* 8.0* 8.1* 8.4* 7.9*  HCT 24.8* 25.6* 25.8* 26.7* 26.2*  MCV 84.1 84.5 84.9 85.3 86.2  PLT 324 342 347 340 322    INR: Recent Labs  Lab 05/10/17 0032 05/11/17 0302 05/12/17 0315 05/13/17 0332 05/14/17 0646  INR 1.63 1.99 1.97 2.52 3.72    Other results:  Imaging   No results found.   Medications:     Scheduled Medications: . aspirin EC  81 mg Oral Daily  . atorvastatin  40 mg Oral q1800  . cephALEXin   500 mg Oral Q12H  . chlorhexidine  15 mL Mouth Rinse BID  . Chlorhexidine Gluconate Cloth  6 each Topical Daily  . citalopram  10 mg Oral Daily  . darbepoetin (ARANESP) injection - DIALYSIS  200 mcg Intravenous Q Thu-HD  . feeding supplement  1 Container Oral BID BM  . ferrous [id number] C-folic acid  1 capsule Oral BID BM  . gabapentin  200 mg Oral BID  . mouth rinse  15 mL Mouth Rinse q12n4p  . pantoprazole  40 mg Oral Daily  . polyethylene glycol  17 g Oral Daily  . torsemide  60 mg Oral BID  . Warfarin - Pharmacist Dosing Inpatient   Does not apply q1800    Infusions: . sodium chloride Stopped (04/30/17 0800)  . ferric gluconate (FERRLECIT/NULECIT) IV Stopped (05/13/17 1802)    PRN Medications: levalbuterol, ondansetron (ZOFRAN) IV, oxyCODONE, simethicone, sodium chloride flush, sodium chloride flush   Patient Profile   David [name] is a 52 y.o. male with a PMH of ICM ('09 PCI to LAD, mRCA, '10 PCI to Lcx and '12 mRCA), HFrEF (EF ~20% for >5 years) s/p HM-III VAD placement on 12/27 due to end-stage systolic HF.   Assessment/Plan:    1. Acute on chronic systolic HF: Echo 99/35/70 with LVEF 20-25%, Mild MR, Mild/Mod MR, Severe LAE, Mildly reduced RV, PA peak pressure 56 mm Hg.  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 Extubated, initially stopped epinephrine/norepinephrine but restarted with fall in UOP and MAP.  With progressive renal failure, pulmonary edema, and hypotension, he was re-intubated on 12/30.   CVVH was begun.  12/31 was difficult day with refractory shock (MAP in 60s).  Reviewed 12/31 echo which showed no significant pericardial effusion, adequate RV function, and IV septum near mid-line.  Off NE/Epi.  - Off dobutamine. Central line pulled.  - Urine output 500-1000cc/day but really no meaningful clearance. - Volume status improved after HD.   - Continue aspirin 81 mg daily.  - Warfarin goal INR 2-2.5. INR 3.7 today.  Rising quickly due to sluggish hepatic function. Will hold today. Cut back dosing. D/w PharmD.  - MAPs stable  - Place TED hose 2. CAD s/p PCI to mid RCA and PLOM with DES x 2 and DES to mid LAD in 1/18.   - Continue ASA  and statin.  - No s/s ischemia 3. PAF: - Remains in NSR. Tele reviewed personally.  4. ARF on CKD stage III: Suspect some degree of intrinsic renal disease prior to LVAD placement (baseline creatinine around 1.5) with probable ATN from intra-op/peri-op hypotension and development of vasodilatory/septic shock.  CVVHD stopped 05/04/2017. First iHD 05/06/17 without problems.Tunneled HD cath placed 05/08/17.  - Urine output 500-1000cc/day but no meaningful clearance. - Volume status improved today after HD yesterday.  - Vein mapping done plan for AV graft placement soon but is now on coumadin with INR 2.5. Will need to d/w Renal  5. Anemia: s/p 1 u PRBCs 12/30, 04/25/17, 1/8.  - Hgb 7.9 this am. Will get type and screen in am, Would likely benefit from 1u RBC. Continue aranesp  - Want to limit transfusion with possibility of transplant down the road. Follow closely.  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 ID: Started on cefepime empirically with septic/vasodilatory shock, PCT was 31 04/25/17.  ABX broadened to Meropenem 04/27/16 with + Achromobacter on tracheal aspirate.  - Finished 05/07/17 (10 days)  PCT 8.31 04/27/17 -> 3.87 ->0.94 - Afebrile.  - Back on Keflex as below with mild driveline drainage. Site stable. WBC climbing slightly. Watch closely. Igf up again tomorrow will expand coverage. 7. Elevated LFTs due to shock liver - LFTs improved but albumin low. Suspect compromised liver synthetic function. Encouraged protein intake. 8. Hyponatremia:  - up to 135 9. Driveline bleeding - Resolved after stitch placed by Dr. Cyndia Bent.  10. Constipation - limit narcotics - sorbitol today 11. Pain - reduce oxycodone - increase gabapentin 200 bid 12. Protein-calorie  malnutrition - will ask nutrition to see - encouraged protein intake   Length of Stay: [place], MD 05/14/2017, 8:26 AM  VAD Team --- VAD ISSUES ONLY--- Pager [phone] (7am - 7am)  Advanced Heart Failure Team  Pager [phone] (M-F; 7a - 4p)  Please contact [facility] for night-coverage after hours (4p -7a ) and weekends on [email or web address]

## 2017-05-14 NOTE — Progress Notes (Signed)
[facility] Progress Note    Assessment/ Plan:    30Q with systolic CHF s/p LVAD 76/22 now with AKI --> ESRD, new-start HD.    1.  AKI on CKD III--> ESRD: likely due to ATN from shock s/p LVAD placement with underlying arterionephrosclerosis.  Required CRRT initially and now has been transitioned to IHD (1/11).  S/p TDC with IR 05/08/17.  UOP OK but no real signs of GFR recovery yet- s/p vein mapping 05/12/17, consider perm access placement sometime this coming week.  He will likely require a graft as a fistula is not likely to mature with non-pulsatile flow.   Next planned HD today 1/21.  2.  Acute on chronic systolic CHF: s/p Heartmate III 12/27.  Advanced HF and cardiothoracic surgery following.  He required a stitch for driveline bleeding 6/33.  Coumadin has been restarted (1/14).  On torsemide 60 BID to augment UOP- agree, this will help keep fluid gains down in between HD and prevent such large UF goals which could induce hypotension.  3.  Elevated LFTs/ Hepatic dysfunction- labile INRs,  Per primary  4.  Hyponatremia: vol related,  Improving.  5.  Achromobacter pneumonia: s/p antibiotics (ended 1/13, 10 days)  6.  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  7.  Dispo: in SDU  Subjective:    Feeling OK this AM.  No complaints   Objective:   BP 96/72 (BP Location: Left Arm)   Pulse 81   Temp 98.2 F (36.8 C) (Oral)   Resp 18   Ht 5\' 5"  (1.651 m)   Wt 73 kg (160 lb 15 oz)   SpO2 98%   BMI 26.78 kg/m   Intake/Output Summary (Last 24 hours) at 05/14/2017 1138 Last data filed at 05/13/2017 2351 Gross per 24 hour  Intake -  Output 2357 ml  Net -2357 ml   Weight change: -0.4 kg (-14.1 oz)  Physical Exam: HLK:TGYBW gentleman, NAD, lying in bed HEENT: some JVD upright CVS: mechanical hum of LVAD Resp: normal WOB today, clear anteriorly, some muffled bilateral bses Abd: + drive line LUQ, dressed Ext:1+ LE edema, improved ACCESS: R IJ TDC In place NEURO: mild  asterixis, AAO x 3 and able to appropriately answer questions  Imaging: No results found.  Labs: BMET Recent Labs  Lab 05/11/17 0302 05/11/17 2136 05/12/17 0315 05/12/17 1517 05/13/17 0332 05/13/17 1828 05/14/17 0646  NA 130* 134* 134* 132* 132* 134* 135  K 3.6 3.6 3.5 3.6 3.5 3.3* 3.4*  CL 93* 97* 96* 97* 95* 97* 97*  CO2 26 29 27 27 27 28 28   GLUCOSE 100* 96 83 125* 97 118* 87  BUN 19 7 8 9 12  <5* 5*  CREATININE 4.94* 2.87* 3.14* 3.70* 4.00* 2.08* 2.93*  CALCIUM 8.6* 8.2* 8.5* 8.5* 8.8* 8.3* 8.6*  PHOS 5.9* 3.1 3.9 3.5 4.7* 1.9* 2.7   CBC Recent Labs  Lab 05/11/17 0302 05/12/17 0315 05/13/17 0332 05/14/17 0646  WBC 11.2* 10.6* 13.4* 14.1*  HGB 8.0* 8.1* 8.4* 7.9*  HCT 25.6* 25.8* 26.7* 26.2*  MCV 84.5 84.9 85.3 86.2  PLT 342 347 340 322    Medications:    . aspirin EC  81 mg Oral Daily  . atorvastatin  40 mg Oral q1800  . cephALEXin  500 mg Oral Q12H  . chlorhexidine  15 mL Mouth Rinse BID  . citalopram  10 mg Oral Daily  . darbepoetin (ARANESP) injection - DIALYSIS  200 mcg Intravenous Q Thu-HD  . feeding supplement  1 Container Oral BID BM  . ferrous [id number] C-folic acid  1 capsule Oral BID BM  . gabapentin  200 mg Oral BID  . mouth rinse  15 mL Mouth Rinse q12n4p  . pantoprazole  40 mg Oral Daily  . polyethylene glycol  17 g Oral Daily  . torsemide  60 mg Oral BID  . Warfarin - Pharmacist Dosing Inpatient   Does not apply [place], MD [facility] pgr [phone] 05/14/2017, 11:38 AM

## 2017-05-14 NOTE — Plan of Care (Signed)
Patient slowly progressing toward goals.  Patient requires a lot of encouragement for independence w/LVAD care and ADLs.  Continued efforts in patient education regarding LVAD, independence w/ADLs, monitoring of fluid volume, dressing changes and infection control.  Family education also improving.  Will continue to monitor progress.

## 2017-05-14 NOTE — Progress Notes (Signed)
[place] for Coumadin Indication: LVAD  Allergies  Allergen Reactions  . Plavix [Clopidogrel Bisulfate] Hives   Patient Measurements: Height: 5\' 5"  (165.1 cm) Weight: 160 lb 15 oz (73 kg) IBW/kg (Calculated) : 61.5  Vital Signs: BP: 102/41 (01/19 2320) Pulse Rate: 80 (01/20 0346)  Labs: Recent Labs    05/12/17 0315 05/12/17 1517 05/13/17 0332 05/13/17 1828 05/14/17 0646  HGB 8.1*  --  8.4*  --  7.9*  HCT 25.8*  --  26.7*  --  26.2*  PLT 347  --  340  --  322  LABPROT 22.3*  --  26.9*  --  36.6*  INR 1.97  --  2.52  --  3.72  CREATININE 3.14* 3.70* 4.00* 2.08*  --    Estimated Creatinine Clearance: 35.7 mL/min (A) (by C-G formula based on SCr of 2.08 mg/dL (H)).  Assessment: 52 yo male s/p LVAD implantation 12/27.   Heparin drip stopped 1/17  INR now up to 3.7 this morning after back down on dose last night. No overt bleeding noted. Hgb low at 7.9 this am , LDH pending but has been stable. Will hold warfarin for today.  He continues on keflex which should not have dramatic effect on INR, diet may be playing larger role but not documented yesterday.  Goal of Therapy:  INR 2-2.5 Monitor platelets by anticoagulation protocol: Yes    Plan: Hold warfarin tonight Daily PT/INR Keflex 500mg  bid started 1/17 for driveline infection  Erin Hearing PharmD., BCPS Clinical Pharmacist 05/14/2017 7:31 AM

## 2017-05-15 ENCOUNTER — Inpatient Hospital Stay (HOSPITAL_COMMUNITY): Payer: Medicare HMO

## 2017-05-15 LAB — CBC
HCT: 27.7 % — ABNORMAL LOW (ref 39.0–52.0)
HEMOGLOBIN: 8.5 g/dL — AB (ref 13.0–17.0)
MCH: 26.6 pg (ref 26.0–34.0)
MCHC: 30.7 g/dL (ref 30.0–36.0)
MCV: 86.8 fL (ref 78.0–100.0)
PLATELETS: 379 10*3/uL (ref 150–400)
RBC: 3.19 MIL/uL — ABNORMAL LOW (ref 4.22–5.81)
RDW: 20.2 % — AB (ref 11.5–15.5)
WBC: 14.7 10*3/uL — ABNORMAL HIGH (ref 4.0–10.5)

## 2017-05-15 LAB — RENAL FUNCTION PANEL
ALBUMIN: 1.7 g/dL — AB (ref 3.5–5.0)
ALBUMIN: 1.8 g/dL — AB (ref 3.5–5.0)
Anion gap: 11 (ref 5–15)
Anion gap: 10 (ref 5–15)
BUN: 10 mg/dL (ref 6–20)
CALCIUM: 8.8 mg/dL — AB (ref 8.9–10.3)
CO2: 30 mmol/L (ref 22–32)
CO2: 27 mmol/L (ref 22–32)
CREATININE: 1.73 mg/dL — AB (ref 0.61–1.24)
Calcium: 7.7 mg/dL — ABNORMAL LOW (ref 8.9–10.3)
Chloride: 94 mmol/L — ABNORMAL LOW (ref 101–111)
Chloride: 98 mmol/L — ABNORMAL LOW (ref 101–111)
Creatinine, Ser: 3.78 mg/dL — ABNORMAL HIGH (ref 0.61–1.24)
GFR calc Af Amer: 20 mL/min — ABNORMAL LOW (ref >60)
GFR calc Af Amer: 50 mL/min — ABNORMAL LOW (ref >60)
GFR, EST NON AFRICAN AMERICAN: 17 mL/min — AB (ref >60)
GFR, EST NON AFRICAN AMERICAN: 43 mL/min — AB (ref >60)
GLUCOSE: 89 mg/dL (ref 65–99)
GLUCOSE: 84 mg/dL (ref 65–99)
PHOSPHORUS: 2.6 mg/dL (ref 2.5–4.6)
PHOSPHORUS: 1.2 mg/dL — AB (ref 2.5–4.6)
POTASSIUM: 3.3 mmol/L — AB (ref 3.5–5.1)
Potassium: 2.8 mmol/L — ABNORMAL LOW (ref 3.5–5.1)
SODIUM: 135 mmol/L (ref 135–145)
SODIUM: 135 mmol/L (ref 135–145)

## 2017-05-15 LAB — MAGNESIUM: MAGNESIUM: 1.5 mg/dL — AB (ref 1.7–2.4)

## 2017-05-15 LAB — PROTIME-INR
INR: 4.6
PROTHROMBIN TIME: 43.1 s — AB (ref 11.4–15.2)

## 2017-05-15 LAB — GLUCOSE, CAPILLARY: Glucose-Capillary: 109 mg/dL — ABNORMAL HIGH (ref 65–99)

## 2017-05-15 LAB — LACTATE DEHYDROGENASE: LDH: 249 U/L — AB (ref 98–192)

## 2017-05-15 MED ORDER — OXYCODONE HCL 5 MG PO TABS
10.0000 mg | ORAL_TABLET | ORAL | Status: DC | PRN
  Administered 2017-05-15 – 2017-05-18 (×11): 10 mg via ORAL
  Filled 2017-05-15 (×11): qty 2

## 2017-05-15 MED ORDER — VANCOMYCIN HCL IN DEXTROSE 750-5 MG/150ML-% IV SOLN: 750.0000 mg | INTRAVENOUS | Status: DC

## 2017-05-15 MED ORDER — CEPHALEXIN 500 MG PO CAPS
500.0000 mg | ORAL_CAPSULE | Freq: Two times a day (BID) | ORAL | Status: DC
  Filled 2017-05-15: qty 1

## 2017-05-15 MED ORDER — AMOXICILLIN-POT CLAVULANATE 500-125 MG PO TABS
1.0000 | ORAL_TABLET | Freq: Two times a day (BID) | ORAL | Status: DC
  Filled 2017-05-15: qty 1

## 2017-05-15 MED ORDER — PRO-STAT SUGAR FREE PO LIQD
30.0000 mL | Freq: Every day | ORAL | Status: DC
  Administered 2017-05-15 – 2017-05-16 (×2): 30 mL via ORAL
  Filled 2017-05-15 (×3): qty 30

## 2017-05-15 MED ORDER — MAGNESIUM SULFATE 2 GM/50ML IV SOLN
2.0000 g | Freq: Once | INTRAVENOUS | Status: AC
  Administered 2017-05-15: 2 g via INTRAVENOUS
  Filled 2017-05-15: qty 50

## 2017-05-15 MED ORDER — AMOXICILLIN-POT CLAVULANATE 500-125 MG PO TABS
1.0000 | ORAL_TABLET | Freq: Every day | ORAL | Status: AC
  Administered 2017-05-15 – 2017-05-18 (×4): 500 mg via ORAL
  Filled 2017-05-15 (×4): qty 1

## 2017-05-15 MED ORDER — VANCOMYCIN HCL 10 G IV SOLR
1500.0000 mg | Freq: Once | INTRAVENOUS | Status: DC
  Administered 2017-05-15: 1500 mg via INTRAVENOUS
  Filled 2017-05-15: qty 1500

## 2017-05-15 NOTE — Progress Notes (Signed)
PT Cancellation Note  Patient Details Name: David [name] MRN: [id number] DOB: Dec 03, 1965   Cancelled Treatment:    Reason Eval/Treat Not Completed: (P) Other (comment)(Pt remains in dialysis will f/u per POC.  )   Wonder Donaway Eli Hose 05/15/2017, 4:32 PM  [name], PTA pager [phone]

## 2017-05-15 NOTE — Progress Notes (Signed)
Exit site care performed:  Drive line exit wound care performed . Existing VAD dressing removed and site care performed using sterile technique.Gauze and silverhave a scantamount of old bloody drainage.Drive line exit site cleaned with Chlora prep applicators x 2, allowed to dry, andgauzedressing withsilver stripre-applied. Exit sitehealingand unincorporated, the velour is fully implanted at exit site.Small amount of reddened area noted. No tenderness, nofoul odor or rash noted. Dr Cyndia Bent, Darrick Grinder NP, and Dr Haroldine Laws made aware of exit site.  VAD Coordinator or patients wife will change dressing tomorrow.     [name] RN, VAD Coordinator 24/7 pager [phone]

## 2017-05-15 NOTE — Progress Notes (Signed)
Transported to hemodialysis unit by bed , accompanied by Lvad cordinator. Stable.

## 2017-05-15 NOTE — Progress Notes (Signed)
[place] for Coumadin Indication: LVAD  Allergies  Allergen Reactions  . Plavix [Clopidogrel Bisulfate] Hives   Patient Measurements: Height: 5\' 5"  (165.1 cm) Weight: 158 lb 8.2 oz (71.9 kg) IBW/kg (Calculated) : 61.5  Vital Signs: Temp: 97.8 F (36.6 C) (01/21 0800) Temp Source: Oral (01/21 0800) BP: 102/66 (01/21 0407) Pulse Rate: 78 (01/21 0407)  Labs: Recent Labs    05/13/17 0332  05/14/17 0646 05/14/17 1520 05/15/17 0511  HGB 8.4*  --  7.9*  --  8.5*  HCT 26.7*  --  26.2*  --  27.7*  PLT 340  --  322  --  379  LABPROT 26.9*  --  36.6*  --  43.1*  INR 2.52  --  3.72  --  4.60*  CREATININE 4.00*   < > 2.93* 3.20* 3.78*   < > = values in this interval not displayed.   Estimated Creatinine Clearance: 19.7 mL/min (A) (by C-G formula based on SCr of 3.78 mg/dL (H)).  Assessment: 52 yo male s/p LVAD implantation 12/27.   Heparin drip stopped 1/17  INR now up to 4 this morning despite decreased dose last night. No overt bleeding noted. Hgb low at 8 this am , LDH has been stable. Will hold warfarin for today.  He has been on keflex which should not have dramatic effect on INR, diet may be playing large role - states poor appetite   Goal of Therapy:  INR 2-2.5 Monitor platelets by anticoagulation protocol: Yes    Plan: Hold warfarin again tonight Daily PT/INR   Bonnita Nasuti Pharm.D. CPP, BCPS Clinical Pharmacist [phone] 05/15/2017 10:24 AM

## 2017-05-15 NOTE — Progress Notes (Signed)
[facility] Progress Note    Assessment/ Plan:    01V with systolic CHF s/p LVAD 49/44 now with AKI --> ESRD, new-start HD.    1.  AKI on CKD III--> ESRD: likely due to ATN from shock s/p LVAD placement with underlying arterionephrosclerosis.  Required CRRT initially and then transitioned to IHD (1/11).  S/p TDC with IR 05/08/17.  UOP OK (1600 yest) but no real signs of GFR recovery yet, last HD on Sat 1/19- s/p vein mapping 05/12/17, consider perm access placement sometime this week if OK with cards.  He will likely require a graft as a fistula is not likely to mature with non-pulsatile flow.   Next planned HD today 1/21.  2.  Acute on chronic systolic CHF: s/p Heartmate III 12/27.  Advanced HF and cardiothoracic surgery following.  He required a stitch for driveline bleeding 9/67.  Coumadin has been restarted (1/14).  On torsemide 60 BID to augment UOP- agree, this will help keep fluid gains down in between HD and prevent such large UF goals which could induce hypotension.  3.  Elevated LFTs/ Hepatic dysfunction- labile INRs,  Per primary  4.  Hyponatremia: vol related,  Improving.  5.  Achromobacter pneumonia: s/p antibiotics (ended 1/13, 10 days)  6.  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  7.  Dispo: in SDU  8. Hypokalemia- on high K bath- was on 3, will do 4  9. Anemia- iron stores low, giving iron - on darbe 200 q week  10. Check PTH, phos OK no binder   Subjective:    Feeling OK this AM.  No complaints, says notices his breathing is not as good    Objective:   BP 102/66 (BP Location: Left Arm)   Pulse 78   Temp (!) 97.5 F (36.4 C) (Oral)   Resp 14   Ht 5\' 5"  (1.651 m)   Wt 71.9 kg (158 lb 8.2 oz)   SpO2 94%   BMI 26.38 kg/m   Intake/Output Summary (Last 24 hours) at 05/15/2017 0805 Last data filed at 05/15/2017 0500 Gross per 24 hour  Intake 1410 ml  Output 1600 ml  Net -190 ml   Weight change: -3.3 kg (-4.4 oz)  Physical Exam: RFF:MBWGY gentleman,  NAD, lying in bed HEENT: some JVD upright CVS: mechanical hum of LVAD Resp: normal WOB today, clear anteriorly, some muffled bilateral bses Abd: + drive line LUQ, dressed Ext:1+ LE edema, improved ACCESS: R IJ TDC In place NEURO: mild asterixis, AAO x 3 and able to appropriately answer questions  Imaging: No results found.  Labs: BMET Recent Labs  Lab 05/12/17 0315 05/12/17 1517 05/13/17 0332 05/13/17 1828 05/14/17 0646 05/14/17 1520 05/15/17 0511  NA 134* 132* 132* 134* 135 134* 135  K 3.5 3.6 3.5 3.3* 3.4* 3.2* 2.8*  CL 96* 97* 95* 97* 97* 97* 94*  CO2 27 27 27 28 28 27 30   GLUCOSE 83 125* 97 118* 87 70 89  BUN 8 9 12  <5* 5* 6 10  CREATININE 3.14* 3.70* 4.00* 2.08* 2.93* 3.20* 3.78*  CALCIUM 8.5* 8.5* 8.8* 8.3* 8.6* 8.5* 8.8*  PHOS 3.9 3.5 4.7* 1.9* 2.7 1.9* 2.6   CBC Recent Labs  Lab 05/12/17 0315 05/13/17 0332 05/14/17 0646 05/15/17 0511  WBC 10.6* 13.4* 14.1* 14.7*  HGB 8.1* 8.4* 7.9* 8.5*  HCT 25.8* 26.7* 26.2* 27.7*  MCV 84.9 85.3 86.2 86.8  PLT 347 340 322 379    Medications:    . aspirin EC  81 mg Oral Daily  . atorvastatin  40 mg Oral q1800  . cephALEXin  500 mg Oral Q12H  . chlorhexidine  15 mL Mouth Rinse BID  . citalopram  10 mg Oral Daily  . darbepoetin (ARANESP) injection - DIALYSIS  200 mcg Intravenous Q Thu-HD  . feeding supplement  1 Container Oral BID BM  . ferrous [id number] C-folic acid  1 capsule Oral BID BM  . gabapentin  200 mg Oral BID  . mouth rinse  15 mL Mouth Rinse q12n4p  . pantoprazole  40 mg Oral Daily  . polyethylene glycol  17 g Oral Daily  . torsemide  60 mg Oral BID  . Warfarin - Pharmacist Dosing Inpatient   Does not apply q1800      Hailea Eaglin A  05/15/2017, 8:05 AM

## 2017-05-15 NOTE — Progress Notes (Signed)
Patient ID: David [name], male   DOB: [birth year], 52 y.o.   MRN: [id number] HeartMate 3 Rounding Note  Subjective:    Sore. Says he has only been given 5 mg of Oxy IR for pain and that does not relieve it.  Eating well. Passing a lot of urine. Waiting to have HD.  LVAD INTERROGATION:  HeartMate IIl LVAD:  Flow 4.5 liters/min, speed 5400, power 4, PI 4.5    Objective:    Vital Signs:   Temp:  [97.5 F (36.4 C)-98.3 F (36.8 C)] 97.7 F (36.5 C) (01/21 1125) Pulse Rate:  [76-88] 76 (01/21 1125) Resp:  [14-30] 19 (01/21 1125) BP: (81-102)/(58-80) 102/66 (01/21 0407) SpO2:  [92 %-97 %] 94 % (01/21 1125) Weight:  [71.9 kg (158 lb 8.2 oz)] 71.9 kg (158 lb 8.2 oz) (01/21 0407) Last BM Date: 05/14/17 Mean arterial Pressure 80's  Intake/Output:   Intake/Output Summary (Last 24 hours) at 05/15/2017 1321 Last data filed at 05/15/2017 0851 Gross per 24 hour  Intake 1220 ml  Output 2000 ml  Net -780 ml     Physical Exam: General:  Chronically ill-appearing but awake and alert, good spirits.   No resp difficulty Cor: intermittent heart sounds with LVAD hum present. Lungs: clear Abdomen: soft, nontender, nondistended. Good bowel sounds. Extremities: minimal bilateral LE edema.  Neuro: alert & orientedx3,  moves all 4 extremities w/o difficulty. Affect pleasant The sternotomy and abdominal incision are healing well. Driveline dressing to be done by VAD coordinator.  Telemetry: sinus rhythm 80's  Labs: Basic Metabolic Panel: Recent Labs  Lab 05/11/17 0302  05/12/17 0315  05/13/17 0332 05/13/17 1828 05/14/17 0646 05/14/17 1520 05/15/17 0511  NA 130*   < > 134*   < > 132* 134* 135 134* 135  K 3.6   < > 3.5   < > 3.5 3.3* 3.4* 3.2* 2.8*  CL 93*   < > 96*   < > 95* 97* 97* 97* 94*  CO2 26   < > 27   < > 27 28 28 27 30   GLUCOSE 100*   < > 83   < > 97 118* 87 70 89  BUN 19   < > 8   < > 12 <5* 5* 6 10  CREATININE 4.94*   < > 3.14*   < > 4.00* 2.08* 2.93* 3.20* 3.78*  CALCIUM  8.6*   < > 8.5*   < > 8.8* 8.3* 8.6* 8.5* 8.8*  MG 1.8  --  1.8  --  1.8  --  1.6*  --  1.5*  PHOS 5.9*   < > 3.9   < > 4.7* 1.9* 2.7 1.9* 2.6   < > = values in this interval not displayed.    Liver Function Tests: Recent Labs  Lab 05/13/17 0332 05/13/17 1828 05/14/17 0646 05/14/17 1520 05/15/17 0511  ALBUMIN 1.8* 1.8* 1.7* 1.7* 1.7*   No results for input(s): LIPASE, AMYLASE in the last 168 hours. No results for input(s): AMMONIA in the last 168 hours.  CBC: Recent Labs  Lab 05/11/17 0302 05/12/17 0315 05/13/17 0332 05/14/17 0646 05/15/17 0511  WBC 11.2* 10.6* 13.4* 14.1* 14.7*  HGB 8.0* 8.1* 8.4* 7.9* 8.5*  HCT 25.6* 25.8* 26.7* 26.2* 27.7*  MCV 84.5 84.9 85.3 86.2 86.8  PLT 342 347 340 322 379    INR: Recent Labs  Lab 05/11/17 0302 05/12/17 0315 05/13/17 0332 05/14/17 0646 05/15/17 0511  INR 1.99 1.97 2.52 3.72 4.60*  Other results:  EKG:   Imaging: Dg Chest 2 View  Result Date: 05/15/2017 CLINICAL DATA:  Elevated white blood cell count EXAM: CHEST  2 VIEW COMPARISON:  05/07/2017 FINDINGS: LVAD, left pacer remain in place, unchanged. Placement of new internal jugular dialysis catheter on the right. The tips are in the right atrium. No pneumothorax. Cardiomegaly with vascular congestion. Airspace opacity noted posteriorly at the bases on the lateral view could reflect atelectasis or infiltrate. IMPRESSION: Cardiomegaly, vascular congestion. Posterior airspace opacity at the bases on the lateral view could reflect atelectasis or infiltrate, suspect in the left lung base. Electronically Signed   By: Rolm Baptise M.D.   On: 05/15/2017 12:26     Medications:     Scheduled Medications: . aspirin EC  81 mg Oral Daily  . atorvastatin  40 mg Oral q1800  . chlorhexidine  15 mL Mouth Rinse BID  . citalopram  10 mg Oral Daily  . darbepoetin (ARANESP) injection - DIALYSIS  200 mcg Intravenous Q Thu-HD  . feeding supplement  1 Container Oral BID BM  . ferrous  [id number] C-folic acid  1 capsule Oral BID BM  . gabapentin  200 mg Oral BID  . mouth rinse  15 mL Mouth Rinse q12n4p  . pantoprazole  40 mg Oral Daily  . polyethylene glycol  17 g Oral Daily  . torsemide  60 mg Oral BID  . Warfarin - Pharmacist Dosing Inpatient   Does not apply q1800    Infusions: . sodium chloride Stopped (04/30/17 0800)  . ferric gluconate (FERRLECIT/NULECIT) IV Stopped (05/13/17 1802)  . vancomycin    . [START ON 05/17/2017] vancomycin      PRN Medications: levalbuterol, ondansetron (ZOFRAN) IV, oxyCODONE, simethicone, sodium chloride flush, sodium chloride flush   Assessment:   1. Ischemic cardiomyopathy s/p multiple PCI's with chronic systolic heart failure and EF 20% on home dobutamine preop with significant improvement in symptoms. Evaluated at [facility] for heart transplant but LVAD felt to be the best treatment for him at this time. POD26s/p HeartMate III LVAD. He was critically ill with multi-system organ failure but has continued to recover and only issue now is renal failure requiring HD. Hemodynamics and VAD parameters very stable 2.H/OHypertension 3.H/OHyperlipidemia 4. PAF in sinuspreopon amiodarone and Eliquis.Now back in sinus off amiodarone.  5.H/OStage III CKD. Acute on chronic postop renal failureon intermittent HD. Making good urine but no significant clearance so far.  Still hopeful for recovery.  6. LBBB s/p CRT-D upgrade in 10/2016 without improvement in symptoms or EF. LV epicardial leads cut off at the time of surgery. 7. Prior smoking with mild obstruction and severe diffusion defect on PFT's. Continue IS. 8. Postop leukocytosis and fever. WBC normalized but now trending up slowly to 14.7 today.  Started on Keflex last week for some redness around the drive line site. Started on Vanc today. 9. Marked rise in bilirubin and transaminases. Likely due to pump runand right sided congestion, hypotension.Resolved   10. INR  continues to rise to 4.6. Coumadin per pharmacy.     11. Severe malnutrition with albumin 1.7. He is eating well now.  Continue to encourage nutrition.    12. PT    13. Anemia: follow. He is on iron.           Plan/Discussion:    Overall he has been very stable. MAP in good range.  VAD flow has been great.  Intermittent HD. Anticipate needing AV graft.  Continue exit site dressings. I would  be hesitant to keep him on vancomycin due to high liklihood of nephrotoxicity.  Continue nutrition, ambulation, IS.   I reviewed the LVAD parameters from today, and compared the results to the patient's prior recorded data.  No programming changes were made.  The LVAD is functioning within specified parameters.   LVAD interrogation was negative for any significant power changes, alarms or PI events/speed drops.  LVAD equipment check completed and is in good working order.  Back-up equipment present.   LVAD education done on emergency procedures and precautions and reviewed exit site care.  Length of Stay: [address]  Fernande Boyden Milagros Middendorf 05/15/2017, 1:21 PM

## 2017-05-15 NOTE — Progress Notes (Signed)
CRITICAL VALUE ALERT  Critical Value:  INR 4.6  Date & Time Notied:  05/15/2017 @ 4469  Provider Notified: Pharmacy  Orders Received/Actions taken: RX will adjust dose

## 2017-05-15 NOTE — Progress Notes (Signed)
OT Cancellation Note  Patient Details Name: David [name] MRN: [id number] DOB: 1965/11/29   Cancelled Treatment:    Reason Eval/Treat Not Completed: Patient at procedure or test/ unavailable.  Pt going for chest xray, then HD.  Will reattempt.  Maleek Craver Frost, OTR/L [phone]   [name] M 05/15/2017, 11:47 AM

## 2017-05-15 NOTE — Progress Notes (Addendum)
LVAD Coordinator Rounding Note:  Admitted12/26due to decompensated heart failure.  Heartmate IIILVAD [facility].AV valve oversewn due to aortic insufficiency.  Drive line dressing CDI with anchor appropriately placed this morning.    Vital signs: Tmax: 97.8 HR:79 Doppler Pressure: 80 Automatic cuff: 102/66 (80) O2 Sat:97 % on 3LNC Wt:165>164>156>160>160>154>158>156>157>160>164>167>169>167>168>167>158 lbs  LVAD interrogation reveals:  Speed:5400 Flow: 4.5 Power:4 w PI:4.5 Alarms:none Events:none Hematocrit:28 Fixed speed:5400 Low speed limit:5100  Labs:  LDH trend:205>407>576>923>967>612>528>492>467>489>441>434>405>376>355>315>265>263>281>293  INR trend:1.34>1.52>1.73>1.95>2.32>3.03>3.80>3.16>2.39>2.23>1.70>1.65>1.91>1.60>1.43>1.49>1.63>1.99>1.97>2.52>3.72>4.60  Anticoagulation Plan: -INR Goal:2-2.5 -ASA Dose: 81 mg   Blood Products: Intra op:2 units/FFP 04/20/17  Post PJ:RPZP'S 1 unit 12/29 1 unit 12/30 1 unit 04/25/17 1 unit 05/02/17 1 unit 05/08/17  Device:Medtronic BiV -Therapies:on   Arrhythmia: 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 (on amiodarone and Eliquis for this) - Afib 12/29 - 04/28/17 - NSR 1/5 - present  Nitric Oxide: - stopped on 12/30 due to initiation of BiPap  Respiratory: - Extubated 12/28 - Reintubated 04/23/2017 - Extubated 04/26/2017  Renal:  04/23/17 - CRRT started for AKI - stopped 05/05/17 -Intermittent HD Monday, Wednesday, Friday  Infection: - 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   2.Please call VAD Pager for any  equipment concerns or drive line dressing questions/concerns. 3. VAD Coordinator will accompany patient to HD today.  [name] RN, VAD Coordinator 24/7 pager [phone]

## 2017-05-15 NOTE — Progress Notes (Signed)
Pharmacy Antibiotic Note  David [name] is a 52 y.o. male admitted on 04/19/2017.  And is s/p LVAD implant 04/20/17.  Pharmacy has been consulted for vancomycin dosing.  S/p keflex day # 5 for driveline site with drainage and now with increasing WBC 10>14, remains afebrile.  With dressing change today driveline site improved Cxr atelectisis - broaden slightly to augmentin   Plan: Stop Keflex  Augmentin 500mg  daily x4 more doses   Height: 5\' 5"  (165.1 cm) Weight: 158 lb 8.2 oz (71.9 kg) IBW/kg (Calculated) : 61.5  Temp (24hrs), Avg:97.9 F (36.6 C), Min:97.5 F (36.4 C), Max:98.4 F (36.9 C)  Recent Labs  Lab 05/11/17 0302  05/12/17 0315  05/13/17 0332 05/13/17 1828 05/14/17 0646 05/14/17 1520 05/15/17 0511  WBC 11.2*  --  10.6*  --  13.4*  --  14.1*  --  14.7*  CREATININE 4.94*   < > 3.14*   < > 4.00* 2.08* 2.93* 3.20* 3.78*   < > = values in this interval not displayed.    Estimated Creatinine Clearance: 19.7 mL/min (A) (by C-G formula based on SCr of 3.78 mg/dL (H)).    Allergies  Allergen Reactions  . Plavix [Clopidogrel Bisulfate] Hives   Augmentin 1/21> (1/24) Keflex 1/17 > 1/21 Meropenem 1/3 >> 1/13 Cefepime 12/30 >>1/3 Zinacef 12/27 > 12/29 Vancomycin 12/27 > 12/28 Rifampin 12/27 > 12/28  1/1 Ucx: NG 12/30 Trach aspirate: few achromobacter species (R cefepime, cefazolin; I cipro) 12/29 BCx: Neg   Bonnita Nasuti Pharm.D. CPP, BCPS Clinical Pharmacist [phone] 05/15/2017 3:19 PM

## 2017-05-15 NOTE — Progress Notes (Signed)
Accompanied patient to HD treatment today. All emergency back up equipment brought to HD unit.  [name] RN, VAD Coordinator 24/7 pager [phone]

## 2017-05-15 NOTE — Progress Notes (Signed)
PT Cancellation Note  Patient Details Name: David [name] MRN: [id number] DOB: 27-Feb-1966   Cancelled Treatment:    Reason Eval/Treat Not Completed: (P) Patient at procedure or test/unavailable(Pt leaving unit for chest xray on 1st attempt at 1142, returned at 1245 and patient lunch tray arrived.  Plan for session post dialysis if time permits.  )   [name] 05/15/2017, 2:10 PM

## 2017-05-15 NOTE — Progress Notes (Signed)
Patient ID: David [name], male   DOB: 04/06/1966, 52 y.o.   MRN: [id number]   Advanced Heart Failure VAD Team Note  Subjective:    David [name] is a 52 y.o. male with a PMH of ICM ('09 PCI to LAD, mRCA, '10 PCI to Lcx and '12 mRCA), HFrEF (EF ~20% for >5 years), HLD and HTN admitted 12/26 for VAD placement due to end-stage systolic HF.   - Underwent successful HM-3 LVAD placement 04/20/17. 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 CVVHD started 12/30 - Started on Meropenem 04/27/16 with + Achromobacter in tracheal aspirate. (Finished 05/07/17) - CVVHD stopped 1/10 - 1st iHD 1/12 - Tunneled HD cath placed 05/08/17 - Started on Keflex for mild drainage from Driveline 6/83/41  Complaining of fatigue. Slightly more SOB. Not very active. Made 1.6L urine yesterday on torsemide 60 bid   INR 4.6  LDH 249  Echo 12/31 reviewed: No significant pericardial effusion, IV septum midline to slightly to the right, RV function adequate.  LVAD INTERROGATION:  HeartMate III LVAD:  Flow 4 .5  liters/min, speed 5400, power 4, PI 4.5.   Objective:    Vital Signs:   Temp:  [97.5 F (36.4 C)-98.3 F (36.8 C)] 97.8 F (36.6 C) (01/21 0800) Pulse Rate:  [77-88] 78 (01/21 0407) Resp:  [14-30] 14 (01/21 0407) BP: (81-103)/(58-80) 102/66 (01/21 0407) SpO2:  [92 %-97 %] 94 % (01/21 0407) Weight:  [158 lb 8.2 oz (71.9 kg)] 158 lb 8.2 oz (71.9 kg) (01/21 0407) Last BM Date: 05/14/17 Mean arterial Pressure 70-80s  Intake/Output:   Intake/Output Summary (Last 24 hours) at 05/15/2017 0925 Last data filed at 05/15/2017 0851 Gross per 24 hour  Intake 1460 ml  Output 2000 ml  Net -540 ml   Physical Exam    Physical Exam: GENERAL: In bed. NAD HEENT: normal  NECK: Supple, JVP to jaw  .  2+ bilaterally, no bruits.  No lymphadenopathy or thyromegaly appreciated.   CARDIAC:  Mechanical heart sounds with LVAD hum present.  L chest tunneled cath  LUNGS:  Clear to auscultation bilaterally. On 2 liters    ABDOMEN:  Soft, round, nontender, positive bowel sounds x4.     LVAD exit site:   Dressing dry and intact.  No erythema or drainage.  Stabilization device present and accurately applied.  Driveline dressing is being changed daily per sterile technique. EXTREMITIES:  Warm and dry, no cyanosis, clubbing, rash. Trace  edema  NEUROLOGIC:  Alert and oriented x 4.  Gait steady.  No aphasia.  No dysarthria.  Affect pleasant.       Telemetry   NSR 70-80s Personally reviewed     Labs   Basic Metabolic Panel: Recent Labs  Lab 05/11/17 0302  05/12/17 0315  05/13/17 0332 05/13/17 1828 05/14/17 0646 05/14/17 1520 05/15/17 0511  NA 130*   < > 134*   < > 132* 134* 135 134* 135  K 3.6   < > 3.5   < > 3.5 3.3* 3.4* 3.2* 2.8*  CL 93*   < > 96*   < > 95* 97* 97* 97* 94*  CO2 26   < > 27   < > 27 28 28 27 30   GLUCOSE 100*   < > 83   < > 97 118* 87 70 89  BUN 19   < > 8   < > 12 <5* 5* 6 10  CREATININE 4.94*   < > 3.14*   < > 4.00* 2.08*  2.93* 3.20* 3.78*  CALCIUM 8.6*   < > 8.5*   < > 8.8* 8.3* 8.6* 8.5* 8.8*  MG 1.8  --  1.8  --  1.8  --  1.6*  --  1.5*  PHOS 5.9*   < > 3.9   < > 4.7* 1.9* 2.7 1.9* 2.6   < > = values in this interval not displayed.    Liver Function Tests: Recent Labs  Lab 05/13/17 0332 05/13/17 1828 05/14/17 0646 05/14/17 1520 05/15/17 0511  ALBUMIN 1.8* 1.8* 1.7* 1.7* 1.7*   No results for input(s): LIPASE, AMYLASE in the last 168 hours. No results for input(s): AMMONIA in the last 168 hours.  CBC: Recent Labs  Lab 05/11/17 0302 05/12/17 0315 05/13/17 0332 05/14/17 0646 05/15/17 0511  WBC 11.2* 10.6* 13.4* 14.1* 14.7*  HGB 8.0* 8.1* 8.4* 7.9* 8.5*  HCT 25.6* 25.8* 26.7* 26.2* 27.7*  MCV 84.5 84.9 85.3 86.2 86.8  PLT 342 347 340 322 379    INR: Recent Labs  Lab 05/11/17 0302 05/12/17 0315 05/13/17 0332 05/14/17 0646 05/15/17 0511  INR 1.99 1.97 2.52 3.72 4.60*    Other results:  Imaging   No results found.   Medications:      Scheduled Medications: . aspirin EC  81 mg Oral Daily  . atorvastatin  40 mg Oral q1800  . cephALEXin  500 mg Oral Q12H  . chlorhexidine  15 mL Mouth Rinse BID  . citalopram  10 mg Oral Daily  . darbepoetin (ARANESP) injection - DIALYSIS  200 mcg Intravenous Q Thu-HD  . feeding supplement  1 Container Oral BID BM  . ferrous [id number] C-folic acid  1 capsule Oral BID BM  . gabapentin  200 mg Oral BID  . mouth rinse  15 mL Mouth Rinse q12n4p  . pantoprazole  40 mg Oral Daily  . polyethylene glycol  17 g Oral Daily  . torsemide  60 mg Oral BID  . Warfarin - Pharmacist Dosing Inpatient   Does not apply q1800    Infusions: . sodium chloride Stopped (04/30/17 0800)  . ferric gluconate (FERRLECIT/NULECIT) IV Stopped (05/13/17 1802)  . magnesium sulfate 1 - 4 g bolus IVPB 2 g (05/15/17 0851)    PRN Medications: levalbuterol, ondansetron (ZOFRAN) IV, oxyCODONE, simethicone, sodium chloride flush, sodium chloride flush   Patient Profile   David [name] is a 52 y.o. male with a PMH of ICM ('09 PCI to LAD, mRCA, '10 PCI to Lcx and '12 mRCA), HFrEF (EF ~20% for >5 years) s/p HM-III VAD placement on 12/27 due to end-stage systolic HF.   Assessment/Plan:    1. Acute on chronic systolic HF: Echo 53/61/44 with LVEF 20-25%, Mild MR, Mild/Mod MR, Severe LAE, Mildly reduced RV, PA peak pressure 56 mm Hg.  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 Extubated, initially stopped epinephrine/norepinephrine but restarted with fall in UOP and MAP.  With progressive renal failure, pulmonary edema, and hypotension, he was re-intubated on 12/30.   CVVH was begun.  12/31 was difficult day with refractory shock (MAP in 60s).  Reviewed 12/31 echo which showed no significant pericardial effusion, adequate RV function, and IV septum near mid-line.  Off NE/Epi.  - Off dobutamine. Central line pulled.  - Volume status elevated for HD today  - Continue aspirin 81 mg  daily.  - Warfarin goal INR 2-2.5. INR 4.6. Coumadin on hold  - MAPs stable  - Place TED hose 2. CAD s/p PCI to  mid RCA and PLOM with DES x 2 and DES to mid LAD in 1/18.   - Continue ASA and statin.  - No s/s ischemia 3. PAF: - Remains in NSR. Tele reviewed personally.  4. ARF on CKD stage III: Suspect some degree of intrinsic renal disease prior to LVAD placement (baseline creatinine around 1.5) with probable ATN from intra-op/peri-op hypotension and development of vasodilatory/septic shock.  CVVHD stopped 05/04/2017. First iHD 05/06/17 without problems.Tunneled HD cath placed 05/08/17.  -  Vein mapping done plan for AV graft placement soon but is now on coumadin with INR 4.6  5. Anemia: s/p 1 u PRBCs 12/30, 04/25/17, 1/8.  - Hgb 8.5. Continue aranesp  - Want to limit transfusion with possibility of transplant down the road. Follow closely.  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 ID: Started on cefepime empirically with septic/vasodilatory shock, PCT was 31 04/25/17.  ABX broadened to Meropenem 04/27/16 with + Achromobacter on tracheal aspirate.  - Finished 05/07/17 (10 days)  PCT 8.31 04/27/17 -> 3.87 ->0.94 - Afebrile.  - on Keflex . Add vanc today.  - WBC 14.7  - CBC in am.  7. Elevated LFTs due to shock liver - LFTs improved but albumin low. Suspect compromised liver synthetic function. Encouraged protein intake. 8. Hyponatremia: Sodium 135  9. Driveline bleeding - Resolved after stitch placed by Dr. Cyndia Bent.  10. Constipation - limit narcotics - sorbitol today 11. Pain - reduce oxycodone - Continue gabapentin 200 bid 12. Protein-calorie malnutrition - will ask nutrition to see - encouraged protein intake - Check Prealbumin in am.     Length of Stay: [place], NP 05/15/2017, 9:25 AM  VAD Team --- VAD ISSUES ONLY--- Pager [phone] (7am - 7am)  Advanced Heart Failure Team  Pager [phone] (M-F; 7a - 4p)  Please contact [facility] for night-coverage  after hours (4p -7a ) and weekends on [email or web address]  Patient seen and examined with Darrick Grinder, NP. We discussed all aspects of the encounter. I agree with the assessment and plan as stated above.   More SOB today and volume up. Now on torsemide 60 bid to help keep volume down between HD days. Made 1.6L urine yesterday. Remain hopeful for renal recovery. Driveline sits ok but WBC still climbing. Will treat with augmenti to finish 7 day abx course. INR up. Holding coumadin. Discussed dosing with PharmD personally.Need to reduce narcotics and be more aggressive with mobilizing. VAD interrogated personally. Parameters stable.   Glori Bickers, MD  6:04 PM

## 2017-05-15 NOTE — Progress Notes (Addendum)
Pharmacy Antibiotic Note  David [name] is a 52 y.o. male admitted on 04/19/2017.  And is s/p LVAD implant 04/20/17.  Pharmacy has been consulted for vancomycin dosing.  S/p keflex day # 5 for driveline site with drainage and now with increasing WBC 10>14, remains afebrile.  With dressing change today driveline site improved Cxr atelectisis - continue same ABX for now and watch Discussed broaden to vanc > decided to continue and watch Plan: Stop Keflex 1/24 ( total 7 days)   Height: 5\' 5"  (165.1 cm) Weight: 158 lb 8.2 oz (71.9 kg) IBW/kg (Calculated) : 61.5  Temp (24hrs), Avg:97.8 F (36.6 C), Min:97.5 F (36.4 C), Max:98.3 F (36.8 C)  Recent Labs  Lab 05/11/17 0302  05/12/17 0315  05/13/17 0332 05/13/17 1828 05/14/17 0646 05/14/17 1520 05/15/17 0511  WBC 11.2*  --  10.6*  --  13.4*  --  14.1*  --  14.7*  CREATININE 4.94*   < > 3.14*   < > 4.00* 2.08* 2.93* 3.20* 3.78*   < > = values in this interval not displayed.    Estimated Creatinine Clearance: 19.7 mL/min (A) (by C-G formula based on SCr of 3.78 mg/dL (H)).    Allergies  Allergen Reactions  . Plavix [Clopidogrel Bisulfate] Hives   Keflex 1/17 > 1/21 Meropenem 1/3 >> 1/13 Cefepime 12/30 >>1/3 Zinacef 12/27 > 12/29 Vancomycin 12/27 > 12/28 Rifampin 12/27 > 12/28  1/1 Ucx: NG 12/30 Trach aspirate: few achromobacter species (R cefepime, cefazolin; I cipro) 12/29 BCx: Neg   Bonnita Nasuti Pharm.D. CPP, BCPS Clinical Pharmacist [phone] 05/15/2017 10:37 AM

## 2017-05-15 NOTE — Progress Notes (Signed)
Back from hemodialysis by bed,awake and alert.

## 2017-05-15 NOTE — Progress Notes (Signed)
Nutrition Follow-up  DOCUMENTATION CODES:   Not applicable  INTERVENTION:   -Continue Boost Breeze po BID, each supplement provides 250 kcal and 9 grams of protein -30 ml Prostat daily, each supplement provides 100 kcals and 15 grams protein  NUTRITION DIAGNOSIS:   Increased nutrient needs related to (LVAD) as evidenced by estimated needs.  Ongoing  GOAL:   Patient will meet greater than or equal to 90% of their needs  Progressing  MONITOR:   PO intake, Supplement acceptance, Labs, Weight trends, I & O's  REASON FOR ASSESSMENT:   Consult Assessment of nutrition requirement/status  ASSESSMENT:   Pt with PMH of HLD, HTN, end-stage CHF admitted for HM-3 LVAD placement 12/27. Extubated 12/28, pt with hypotension and poor UOP CRRT started 12/30. Pt re-intubated 12/30 with worsening acidosis and respiratory distress. Extubated 12/31.   Spoke with pt at bedside, who was in good spirits, but expressed frustration over prolonged hospitalization ("I'm about halfway there"). Pt reports fair appetite; likes grilled cheese and blueberry pancakes. Noted meal completion 50-100%. Pt shares meal intake is variable, because meal times often get disrupted by tests and procedures. He really enjoys the [employer] supplements and confirms that he has been consuming them ("I had two just last night").   Provided pt with encouragement and discussed importance of good meal and supplement intake to promote healing. Pt agreeable to continue supplements.    Noted 9# wt loss over the past week, suspect this may be related to fluid changes. Wt has ranged from 157-170# since admission.   Albumin has a half-life of 21 days and is strongly affected by stress response and inflammatory process, therefore, do not expect to see an improvement in this lab value during acute hospitalization. When a patient presents with low albumin, it is likely skewed due to the acute inflammatory response.  Unless it is  suspected that patient has poor PO intake or malnutrition, then RD should not be consulted solely for low albumin. Note that low albumin is no longer used to diagnose malnutrition; [place] uses the new malnutrition guidelines published by the American Society for Parenteral and Enteral Nutrition (A.S.P.E.N.) and the Academy of Nutrition and Dietetics (AND).    Labs reviewed: K: 2.8, CBGS: 109 .   NUTRITION - FOCUSED PHYSICAL EXAM:    Most Recent Value  Orbital Region  Mild depletion  Upper Arm Region  No depletion  Thoracic and Lumbar Region  No depletion  Buccal Region  No depletion  Temple Region  No depletion  Clavicle Bone Region  No depletion  Clavicle and Acromion Bone Region  No depletion  Scapular Bone Region  No depletion  Dorsal Hand  No depletion  Patellar Region  No depletion  Anterior Thigh Region  No depletion  Posterior Calf Region  No depletion  Edema (RD Assessment)  Mild  Hair  Reviewed  Eyes  Reviewed  Mouth  Reviewed  Skin  Reviewed  Nails  Reviewed       Diet Order:  Diet Heart Room service appropriate? Yes; Fluid consistency: Thin  EDUCATION NEEDS:   No education needs have been identified at this time  Skin:  Skin Assessment: Skin Integrity Issues: Skin Integrity Issues:: Incisions Incisions: sternum and abdomen  Last BM:  05/14/17  Height:   Ht Readings from Last 1 Encounters:  05/11/17 5\' 5"  (1.651 m)    Weight:   Wt Readings from Last 1 Encounters:  05/15/17 158 lb 8.2 oz (71.9 kg)    Ideal  Body Weight:  61.8 kg  BMI:  Body mass index is 26.38 kg/m.  Estimated Nutritional Needs:   Kcal:  2100-2300  Protein:  105-120 grams  Fluid:  >1.5 L/day    Nolah Krenzer A. Jimmye Norman, RD, LDN, CDE Pager: [phone] After hours Pager: [phone]

## 2017-05-15 NOTE — Procedures (Signed)
Patient was seen on dialysis and the procedure was supervised.  BFR 350  Via PC BP is  91/56.   Patient appears to be tolerating treatment well- chronic hypotension  Steffany Schoenfelder A 05/15/2017

## 2017-05-15 NOTE — Progress Notes (Signed)
CSW attempted to visit patient although in dialysis at the time. CSW spoke with wife and offered support. CSW will continue to follow throughout implant hospitalization.Raquel Sarna, [place], [place]

## 2017-05-16 LAB — RENAL FUNCTION PANEL
Albumin: 1.5 g/dL — ABNORMAL LOW (ref 3.5–5.0)
Albumin: 1.6 g/dL — ABNORMAL LOW (ref 3.5–5.0)
Anion gap: 9 (ref 5–15)
Anion gap: 9 (ref 5–15)
BUN: 9 mg/dL (ref 6–20)
CALCIUM: 8.2 mg/dL — AB (ref 8.9–10.3)
CHLORIDE: 99 mmol/L — AB (ref 101–111)
CO2: 27 mmol/L (ref 22–32)
CO2: 27 mmol/L (ref 22–32)
Calcium: 8 mg/dL — ABNORMAL LOW (ref 8.9–10.3)
Chloride: 97 mmol/L — ABNORMAL LOW (ref 101–111)
Creatinine, Ser: 2.62 mg/dL — ABNORMAL HIGH (ref 0.61–1.24)
Creatinine, Ser: 3.49 mg/dL — ABNORMAL HIGH (ref 0.61–1.24)
GFR calc Af Amer: 30 mL/min — ABNORMAL LOW (ref >60)
GFR calc non Af Amer: 26 mL/min — ABNORMAL LOW (ref >60)
GFR calc non Af Amer: 19 mL/min — ABNORMAL LOW (ref >60)
GFR, EST AFRICAN AMERICAN: 21 mL/min — AB (ref >60)
GLUCOSE: 93 mg/dL (ref 65–99)
Glucose, Bld: 104 mg/dL — ABNORMAL HIGH (ref 65–99)
PHOSPHORUS: 2.7 mg/dL (ref 2.5–4.6)
POTASSIUM: 3.3 mmol/L — AB (ref 3.5–5.1)
Phosphorus: 1.9 mg/dL — ABNORMAL LOW (ref 2.5–4.6)
Potassium: 3.3 mmol/L — ABNORMAL LOW (ref 3.5–5.1)
SODIUM: 133 mmol/L — AB (ref 135–145)
Sodium: 135 mmol/L (ref 135–145)

## 2017-05-16 LAB — CBC
HEMATOCRIT: 25.8 % — AB (ref 39.0–52.0)
HEMOGLOBIN: 7.8 g/dL — AB (ref 13.0–17.0)
MCH: 26.2 pg (ref 26.0–34.0)
MCHC: 30.2 g/dL (ref 30.0–36.0)
MCV: 86.6 fL (ref 78.0–100.0)
Platelets: 347 10*3/uL (ref 150–400)
RBC: 2.98 MIL/uL — ABNORMAL LOW (ref 4.22–5.81)
RDW: 20.4 % — ABNORMAL HIGH (ref 11.5–15.5)
WBC: 17.1 10*3/uL — ABNORMAL HIGH (ref 4.0–10.5)

## 2017-05-16 LAB — LACTATE DEHYDROGENASE: LDH: 224 U/L — ABNORMAL HIGH (ref 98–192)

## 2017-05-16 LAB — PROTIME-INR
INR: 4.76
PROTHROMBIN TIME: 44.3 s — AB (ref 11.4–15.2)

## 2017-05-16 LAB — PREALBUMIN: Prealbumin: 12.3 mg/dL — ABNORMAL LOW (ref 18–38)

## 2017-05-16 LAB — MAGNESIUM: MAGNESIUM: 1.6 mg/dL — AB (ref 1.7–2.4)

## 2017-05-16 MED ORDER — POTASSIUM CHLORIDE CRYS ER 20 MEQ PO TBCR
20.0000 meq | EXTENDED_RELEASE_TABLET | Freq: Once | ORAL | Status: AC
  Administered 2017-05-16: 20 meq via ORAL
  Filled 2017-05-16: qty 1

## 2017-05-16 NOTE — Progress Notes (Signed)
[place] for Coumadin Indication: LVAD  Allergies  Allergen Reactions  . Plavix [Clopidogrel Bisulfate] Hives   Patient Measurements: Height: 5\' 5"  (165.1 cm) Weight: 157 lb 3 oz (71.3 kg) IBW/kg (Calculated) : 61.5  Vital Signs: Temp: 97.7 F (36.5 C) (01/22 0406) Temp Source: Oral (01/22 0406) BP: 97/69 (01/22 0406) Pulse Rate: 84 (01/22 0725)  Labs: Recent Labs    05/14/17 0646  05/15/17 0511 05/15/17 1739 05/16/17 0336  HGB 7.9*  --  8.5*  --  7.8*  HCT 26.2*  --  27.7*  --  25.8*  PLT 322  --  379  --  347  LABPROT 36.6*  --  43.1*  --  44.3*  INR 3.72  --  4.60*  --  4.76*  CREATININE 2.93*   < > 3.78* 1.73* 2.62*   < > = values in this interval not displayed.   Estimated Creatinine Clearance: 28.4 mL/min (A) (by C-G formula based on SCr of 2.62 mg/dL (H)).  Assessment: 52 yo male s/p LVAD implantation 12/27.   Heparin drip stopped 1/17  INR now up to 4.6  this morning despite decreased dose last night. No overt bleeding noted. Hgb low at 7. this am,  LDH 200s has been stable. Will hold warfarin again today. Not eating much - instructed to drink ensure or boost every day  He has been on keflex which should not have dramatic effect on INR, diet may be playing large role - states poor appetite   Goal of Therapy:  INR 2-2.5 Monitor platelets by anticoagulation protocol: Yes    Plan: Hold warfarin again tonight Daily PT/INR   Bonnita Nasuti Pharm.D. CPP, BCPS Clinical Pharmacist [phone] 05/16/2017 10:13 AM

## 2017-05-16 NOTE — Progress Notes (Signed)
[facility] Progress Note    Assessment/ Plan:    47F with systolic CHF s/p LVAD 02/63  with AKI  On CKD--> ESRD, new-start HD.    1.  AKI on CKD III--> ESRD: likely due to ATN from shock s/p LVAD placement with underlying arterionephrosclerosis.  Required CRRT initially and then transitioned to IHD (1/11).  S/p TDC with IR 05/08/17.  UOP OK (1800 yest) but no real signs of GFR recovery yet, last HD yest 1/21- s/p vein mapping 05/12/17, consider perm access placement sometime this week if OK with cards but at present INR too high.  He will likely require a graft as a fistula is not likely to mature with non-pulsatile flow.  I also do not know if would be worth it because I dont know this pts long term outlook.  Next HD planned HD for 1/23= Wednesday  2.  Acute on chronic systolic CHF: s/p Heartmate III 12/27.  Advanced HF and cardiothoracic surgery following.  He required a stitch for driveline bleeding 7/85.  Coumadin has been restarted (1/14).  On torsemide 60 BID to augment UOP- agree, this will help keep fluid gains down in between HD and prevent such large UF goals which could induce hypotension.  3.  Elevated LFTs/ Hepatic dysfunction- labile INRs,  Per primary  4.  Hyponatremia: vol related,  Improving.   5.  Achromobacter pneumonia: s/p antibiotics (ended 1/13, 10 days)  6.  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 Hypokalemia- on high K bath-   8. Anemia- iron stores low, giving iron - on darbe 200 q week  9. Check PTH, phos OK no binder - actually low  Subjective:    Had HD yest- only removed 180 ccs due to low BP- but makes a good amount of urine   Objective:   BP 97/69 (BP Location: Left Arm)   Pulse 84   Temp 97.7 F (36.5 C) (Oral)   Resp 19   Ht 5\' 5"  (1.651 m)   Wt 71.3 kg (157 lb 3 oz)   SpO2 96%   BMI 26.16 kg/m   Intake/Output Summary (Last 24 hours) at 05/16/2017 0820 Last data filed at 05/16/2017 0700 Gross per 24 hour  Intake 527 ml   Output 2005 ml  Net -1478 ml   Weight change: 0 kg (0 lb)  Physical Exam: [id number] gentleman, NAD, lying in bed HEENT: some JVD upright CVS: mechanical hum of LVAD Resp: normal WOB today, clear anteriorly, some muffled bilateral bses Abd: + drive line LUQ, dressed Ext:1+ LE edema, improved ACCESS: R IJ TDC In place NEURO: mild asterixis, AAO x 3 and able to appropriately answer questions  Imaging: Dg Chest 2 View  Result Date: 05/15/2017 CLINICAL DATA:  Elevated white blood cell count EXAM: CHEST  2 VIEW COMPARISON:  05/07/2017 FINDINGS: LVAD, left pacer remain in place, unchanged. Placement of new internal jugular dialysis catheter on the right. The tips are in the right atrium. No pneumothorax. Cardiomegaly with vascular congestion. Airspace opacity noted posteriorly at the bases on the lateral view could reflect atelectasis or infiltrate. IMPRESSION: Cardiomegaly, vascular congestion. Posterior airspace opacity at the bases on the lateral view could reflect atelectasis or infiltrate, suspect in the left lung base. Electronically Signed   By: Rolm Baptise M.D.   On: 05/15/2017 12:26    Labs: BMET Recent Labs  Lab 05/13/17 0332 05/13/17 1828 05/14/17 0646 05/14/17 1520 05/15/17 0511 05/15/17 1739 05/16/17 0336  NA 132* 134* 135 134*  135 135 135  K 3.5 3.3* 3.4* 3.2* 2.8* 3.3* 3.3*  CL 95* 97* 97* 97* 94* 98* 99*  CO2 27 28 28 27 30 27 27   GLUCOSE 97 118* 87 70 89 84 93  BUN 12 <5* 5* 6 10 <5* <5*  CREATININE 4.00* 2.08* 2.93* 3.20* 3.78* 1.73* 2.62*  CALCIUM 8.8* 8.3* 8.6* 8.5* 8.8* 7.7* 8.0*  PHOS 4.7* 1.9* 2.7 1.9* 2.6 1.2* 1.9*   CBC Recent Labs  Lab 05/13/17 0332 05/14/17 0646 05/15/17 0511 05/16/17 0336  WBC 13.4* 14.1* 14.7* 17.1*  HGB 8.4* 7.9* 8.5* 7.8*  HCT 26.7* 26.2* 27.7* 25.8*  MCV 85.3 86.2 86.8 86.6  PLT 340 322 379 347    Medications:    . amoxicillin-clavulanate  1 tablet Oral Daily  . aspirin EC  81 mg Oral Daily  . atorvastatin  40 mg  Oral q1800  . chlorhexidine  15 mL Mouth Rinse BID  . citalopram  10 mg Oral Daily  . darbepoetin (ARANESP) injection - DIALYSIS  200 mcg Intravenous Q Thu-HD  . feeding supplement  1 Container Oral BID BM  . feeding supplement (PRO-STAT SUGAR FREE 64)  30 mL Oral QHS  . ferrous [id number] C-folic acid  1 capsule Oral BID BM  . gabapentin  200 mg Oral BID  . mouth rinse  15 mL Mouth Rinse q12n4p  . pantoprazole  40 mg Oral Daily  . polyethylene glycol  17 g Oral Daily  . torsemide  60 mg Oral BID  . Warfarin - Pharmacist Dosing Inpatient   Does not apply q1800      Unika Nazareno A  05/16/2017, 8:20 AM

## 2017-05-16 NOTE — Progress Notes (Signed)
VAD teaching Note:  VAD teaching completed with David [name] and his caregiver support team which includes his wife David [name].            The home inspection checklist has been reviewed and no unsafe conditions have been identified. Specifically the family again reports that there are at least two dedicated grounded, 3-prong outlets with clearly labeled circuit breaker has been established in the bedroom for Charity fundraiser and MPU, running water is in the home as well as a reliable telephone.   A daily [name] sheet with patient  weight, temperature,  [name], speed, power, and PI, along with daily self checks on system controller have been performed by patient and caregiver(s) during hospitalization and will also be done daily at home.   Both patient and caregivers have been trained on the following:   1. HeartMate 3 LVAD overview of system operations  2. Overview of major lifestyle accommodations and cautions   3. Overview of system components (features and functions) 4. Changing power sources 5. Overview of alerts and alarms 6. How to identify and manage an emergency including when pump is running and when pump has stopped      7. Changing system controller 8. Maintain emergency contact list and medications  The patient and patent's caregivers have successfully demonstrated:   1. Changing power source (from batteries to MPU/PM, MPU/PM to batteries replacing batteries) 2. Perform system controller self test  3. Check and charge batteries  4. Identify routine maintanence for MPU/UBC/batteries.  5. Change system controller. 6. Paged VAD pager and programmed number in phones.  Exit Site Care:  The caregiver(s) has been trained on percutaneous lead exit site care and dressing changes and have performed sterile dressing changes during patient's hospitalization under nursing supervision. The importance of lead immobilization has been stressed to patient and caregiver(s) using the  attachment device. The caregiver has successfully demonstrated the following: 1. Accurate sterile technique 2. Cleaning of the site per hospital protocol 2. Applying the dressing correctly  3. Immobilizing drive line appropriately   The following routine activities and maintenance have been reviewed with patient and caregiver(s) and both verbalize understanding:  1. Stressed importance of never disconnecting power from both controller power leads at the same time, and never disconnecting both batteries at the same time.  2. Plug the Mobile Power Unit (MPU) and the universal Charity fundraiser (UBC) into properly grounded (3 prong) outlets dedicated to MPU/UBC use. Do NOT use adapter (cheater plug) for ungrounded outlets or multiple portable socket outlets (power strips). 3. Do not connect the MPU or UBC to an outlet controlled by wall switch or the device may not work 4. The MPU AA batteries that provide power to the speaker and indicator lights should be changed every 6 months.  6. Transfer from MPU to batteries during [facility] power failure. The system controller will provide 57m of power to run the pump at set speed.  7. Keep a backup system controller, charged batteries, battery clips, and flashlight near you during sleep in case of electrical power outage 8. Clean battery, battery clip, and universal battery charger contacts weekly 9. Visually inspect percutaneous lead daily - report any tearing, splitting or damage.  10. Check cables and connectors when changing power source  11. Rotate batteries weekly; keep all eight batteries charged. Specific emphasis to remember the emergency back up batteries.  12. Always have backup system controller, battery clips, fully charged batteries, and spare fully charged batteries when traveling. Always notify your  VAD Team of travel >3 hours distance away.  13. Re-calibrate batteries every 70 uses; recharge back up system controller every 6 months, monitor battery  life of 36 months or 360 uses; replace batteries at end of battery life   Identified the following changes in activities of daily living with pump:  1. No driving for at least six weeks and then only if doctor gives permission to do so 2. No tub baths while pump implanted, and shower only if doctor gives permission with the manufacturer shower equipment.  3. No swimming or submersion in water while implanted with pump 4. Keep all VAD equipment away from water or moisture 5. Keep all VAD connections clean and dry 6. No contact sports or engage in jumping activities 7. Avoid strong static electricity (touching TV/computer screens, vacuuming) 8. Never have an MRI while implanted with the pump 9. Never leave or store batteries in extremely hot or cold places (such as   trunk of your car), or the battery life will be shortened 10. Call the doctor or hospital contact person if any change in how the pump sounds, feels, or works 11. Plan to sleep only when connected to the MPU. 12. Keep a backup system controller, charged batteries, battery clips, and flashlight near you during sleep in case of electrical power outage 13. Do not sleep on your stomach and avoid laying on driveline exit site, especially while it is healing.  14. Talk with doctor/coordinator before any long distance travel plans 15. Patient will need antibiotics prior to any dental procedure; instructed to contact VAD coordinator before any dental procedures (including routine cleaning) 16. Paitent will maintain appointments with PCP.    Warfarin / ASA Education & Management:  Discussed frequency and importance of INR checks and taking coumadin/ASA as prescribed by the doctor; emphasized importance of maintaining INR goal to prevent clotting and/or bleeding issues with pump. He has never been on warfarin in the past and will meet more with coordinators and pharm-D on outpatient basis. He has received inpatient coumadin education  and has been educated on diet and importance to inform us about medication changes made outside VAD clinic. Reinforced that he should ONLY take instructions regarding his coumadin from our VAD team and we need to be involved in any elective procedures that require warfarin hold.   Able to answer questions and asked good questions pertaining to warfarin and diet/lifestyle changes necessary to be successful and safe.  Patient will have INR managed with the assistance of Home Health RN ([place]) while on home milrinone; current INR goal is 2.0 - 2.5.    Controller Change Outs: 1. Patient should have a caregiver present during system controller exchange or call 911 and VAD pager before attempting to change controller if alone. 2. As part of the weekly safety checklist, the patient and caregiver should review and understand the steps and instructions involved with replacing the system controller.  3. As part of monthly safety checklist, the patient and caregiver should review and understand the system controller alarms and how to resolve them.  4. Updated copy of HM 3 Guide to Replacing the [place] with the [place] for Patients and Their Caregiver(s) was given and reviewed.  5. HM 3 Home Flowsheets given that include the weekly and monthly checks as noted above.   Will follow up tomorrow with David [name]. Test was left at the bedside for completion tonight.    [name] RN, VAD Coordinator 24/7 pager [phone]

## 2017-05-16 NOTE — Progress Notes (Signed)
CARDIAC REHAB PHASE I   PRE:  Rate/Rhythm: 84 SR    BP: sitting 98 MAP    SaO2: 99 3L  MODE:  Ambulation: 850 ft   POST:  Rate/Rhythm: 95 SR    BP: sitting 100 MAP     SaO2: Wouldn't register on RA, 100 3L after walk  Pt sat up out of bed independently. System controller was in the floor and we discussed keeping it close and preferably attached to him. He was able to tell us what he needed to connect to batteries. He had one instance of getting lost when he tried to connect the first battery to the power cord.  Needed to start over to clear his thinking. Did second battery correctly. After walking he went back to power from batteries correctly as well. Remembered his bag with a cue. He walked first lap (425 ft) without O2. However SaO2 would not register and he felt SOB therefore reapplied 3L. Second lap he felt better on 3L. Pt walked steadily with rollator and supervision. Several times he walked his left foot outside of wheels of rollator on turns. We discussed staying "inside" rollator and last turn he did well, took it slower. To bed, tired. Left on 3L. He tried to push himself up in bed then c/o sternal pain. We discussed this. Will continue to follow as x1 (started today with x2 assist). Practiced x2 on IS but chest was hurting. ~743mL [phone]  [place], ACSM 05/16/2017 2:46 PM

## 2017-05-16 NOTE — Progress Notes (Signed)
CSW met with patient's wife. She stated she completed a dressing change today and feeling more confident. Wife following up on medicaid application and asked CSW to assist with follow up call to worker. CSW requested wife to give permission for CSW to speak and forward number to worker. Wife appears to be in better spirits today. CSW continues to follow for support throughout implant hospitalization. Raquel Sarna, [place], [place]

## 2017-05-16 NOTE — Progress Notes (Signed)
LVAD Coordinator Rounding Note:  Admitted12/26due to decompensated heart failure.  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  Drive line dressing CDI with anchor appropriately placed this morning.    Vital signs: Tmax: 97.7 HR:80 Doppler Pressure: 80 Automatic cuff: 97/69 (80) O2 Sat:96 % on3LNC Wt:165>164>156>160>160>154>158>156>157>160>164>167>169>167>168>167>158>157lbs  LVAD interrogation reveals:  Speed:5500 Flow: 4.7 Power:4.0 w PI:3.7 Alarms:none Events:none Hematocrit:26 Fixed speed:5400 Low speed limit:5100  Labs:  LDH trend:205>407>576>923>967>612>528>492>467>489>441>434>405>376>355>315>265>263>281>293>224  INR trend:1.34>1.52>1.73>1.95>2.32>3.03>3.80>3.16>2.39>2.23>1.70>1.65>1.91>1.60>1.43>1.49>1.63>1.99>1.97>2.52>3.72>4.60>4.76  Anticoagulation Plan: -INR Goal:2-2.5 -ASA Dose: 81 mg   Blood Products: Intra op:2 units/FFP 04/20/17  Post OE:CXFQ'H 1 unit 12/29 1 unit 12/30 1 unit 04/25/17 1 unit 05/02/17 1 unit 05/08/17  Device:Medtronic BiV -Therapies:on  Arrhythmia: 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 (on amiodarone and Eliquis for this) - Afib 12/29 - 04/28/17 - NSR 1/5 - present  Nitric Oxide: - stopped on 12/30 due to initiation of BiPap  Respiratory: - Extubated 12/28 - Reintubated 04/23/2017 - Extubated 04/26/2017  Renal:  04/23/17 - CRRT started for AKI - stopped 05/05/17 -Intermittent HD Monday, Wednesday, Friday  Infection: - 04/27/17 + 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  2.Please call VAD Pager for any equipment concerns or drive line dressing questions/concerns. 3. Will meet patient and family for VAD education today at 4.  [name] RN, VAD Coordinator 24/7 pager [phone]

## 2017-05-16 NOTE — Progress Notes (Signed)
Patient ID: David [name], male   DOB: 05-30-1965, 52 y.o.   MRN: [id number]   Advanced Heart Failure VAD Team Note  Subjective:    David [name] is a 52 y.o. male with a PMH of ICM ('09 PCI to LAD, mRCA, '10 PCI to Lcx and '12 mRCA), HFrEF (EF ~20% for >5 years), HLD and HTN admitted 12/26 for VAD placement due to end-stage systolic HF.   - Underwent successful HM-3 LVAD placement 04/20/17. 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 CVVHD started 12/30 - Started on Meropenem 04/27/16 with + Achromobacter in tracheal aspirate. (Finished 05/07/17) - CVVHD stopped 1/10 - 1st iHD 1/12 - Tunneled HD cath placed 05/08/17 - Started on Keflex for mild drainage from Driveline 5/46/27  Had iHD 1/21. Making over 1.8 liters urine.    Denies SOB. Able to walk in hall with PT. Appetite getting better. Still feels a bit weak   INR 4.76  LDH 224  Echo 12/31 reviewed: No significant pericardial effusion, IV septum midline to slightly to the right, RV function adequate.  LVAD INTERROGATION:  HeartMate III LVAD:  Flow 4.7  liters/min, speed 5400, power 4, PI 3.8 .   Objective:    Vital Signs:   Temp:  [97.3 F (36.3 C)-98.4 F (36.9 C)] 97.7 F (36.5 C) (01/22 0406) Pulse Rate:  [73-89] 84 (01/22 0725) Resp:  [14-20] 19 (01/22 0725) BP: (77-111)/(40-84) 97/69 (01/22 0406) SpO2:  [90 %-100 %] 96 % (01/22 0725) Weight:  [157 lb 3 oz (71.3 kg)-158 lb 11.7 oz (72 kg)] 157 lb 3 oz (71.3 kg) (01/22 0515) Last BM Date: 05/14/17 Mean arterial Pressure 70-80s  Intake/Output:   Intake/Output Summary (Last 24 hours) at 05/16/2017 0932 Last data filed at 05/16/2017 0700 Gross per 24 hour  Intake 477 ml  Output 1605 ml  Net -1128 ml   Physical Exam  MAPs 80s  Physical Exam: GENERAL:  no acute distress Sitting in the chair. Weak.  HEENT: normal  NECK: Supple, JVP 9-10 .  Carotid 2+ bilaterally, no bruits.  No lymphadenopathy or thyromegaly appreciated.   CARDIAC:  Mechanical heart sounds with LVAD  hum present. R upper chest HD catheter.  LUNGS:  Decreased in the bases on 3 liters  ABDOMEN:  Soft, round, nontender, positive bowel sounds x4.     LVAD exit site: Dressing dry and intact.  No erythema or drainage.  Stabilization device present and accurately applied.  Driveline dressing is being changed daily per sterile technique. EXTREMITIES:  Warm and dry, no cyanosis, clubbing, rash, trace to 1+ edema  NEUROLOGIC:  Alert and oriented x 4.  Gait steady.  No aphasia.  No dysarthria.  Affect pleasant.      Telemetry    NSR 80s  Personally reviewed   Labs   Basic Metabolic Panel: Recent Labs  Lab 05/12/17 0315  05/13/17 0332  05/14/17 0646 05/14/17 1520 05/15/17 0511 05/15/17 1739 05/16/17 0336  NA 134*   < > 132*   < > 135 134* 135 135 135  K 3.5   < > 3.5   < > 3.4* 3.2* 2.8* 3.3* 3.3*  CL 96*   < > 95*   < > 97* 97* 94* 98* 99*  CO2 27   < > 27   < > 28 27 30 27 27   GLUCOSE 83   < > 97   < > 87 70 89 84 93  BUN 8   < > 12   < >  5* 6 10 <5* <5*  CREATININE 3.14*   < > 4.00*   < > 2.93* 3.20* 3.78* 1.73* 2.62*  CALCIUM 8.5*   < > 8.8*   < > 8.6* 8.5* 8.8* 7.7* 8.0*  MG 1.8  --  1.8  --  1.6*  --  1.5*  --  1.6*  PHOS 3.9   < > 4.7*   < > 2.7 1.9* 2.6 1.2* 1.9*   < > = values in this interval not displayed.    Liver Function Tests: Recent Labs  Lab 05/14/17 0646 05/14/17 1520 05/15/17 0511 05/15/17 1739 05/16/17 0336  ALBUMIN 1.7* 1.7* 1.7* 1.8* 1.5*   No results for input(s): LIPASE, AMYLASE in the last 168 hours. No results for input(s): AMMONIA in the last 168 hours.  CBC: Recent Labs  Lab 05/12/17 0315 05/13/17 0332 05/14/17 0646 05/15/17 0511 05/16/17 0336  WBC 10.6* 13.4* 14.1* 14.7* 17.1*  HGB 8.1* 8.4* 7.9* 8.5* 7.8*  HCT 25.8* 26.7* 26.2* 27.7* 25.8*  MCV 84.9 85.3 86.2 86.8 86.6  PLT 347 340 322 379 347    INR: Recent Labs  Lab 05/12/17 0315 05/13/17 0332 05/14/17 0646 05/15/17 0511 05/16/17 0336  INR 1.97 2.52 3.72 4.60* 4.76*     Other results:  Imaging   Dg Chest 2 View  Result Date: 05/15/2017 CLINICAL DATA:  Elevated white blood cell count EXAM: CHEST  2 VIEW COMPARISON:  05/07/2017 FINDINGS: LVAD, left pacer remain in place, unchanged. Placement of new internal jugular dialysis catheter on the right. The tips are in the right atrium. No pneumothorax. Cardiomegaly with vascular congestion. Airspace opacity noted posteriorly at the bases on the lateral view could reflect atelectasis or infiltrate. IMPRESSION: Cardiomegaly, vascular congestion. Posterior airspace opacity at the bases on the lateral view could reflect atelectasis or infiltrate, suspect in the left lung base. Electronically Signed   By: Rolm Baptise M.D.   On: 05/15/2017 12:26     Medications:     Scheduled Medications: . amoxicillin-clavulanate  1 tablet Oral Daily  . aspirin EC  81 mg Oral Daily  . atorvastatin  40 mg Oral q1800  . chlorhexidine  15 mL Mouth Rinse BID  . citalopram  10 mg Oral Daily  . darbepoetin (ARANESP) injection - DIALYSIS  200 mcg Intravenous Q Thu-HD  . feeding supplement  1 Container Oral BID BM  . feeding supplement (PRO-STAT SUGAR FREE 64)  30 mL Oral QHS  . ferrous [id number] C-folic acid  1 capsule Oral BID BM  . gabapentin  200 mg Oral BID  . mouth rinse  15 mL Mouth Rinse q12n4p  . pantoprazole  40 mg Oral Daily  . polyethylene glycol  17 g Oral Daily  . torsemide  60 mg Oral BID  . Warfarin - Pharmacist Dosing Inpatient   Does not apply q1800    Infusions: . sodium chloride Stopped (04/30/17 0800)  . ferric gluconate (FERRLECIT/NULECIT) IV Stopped (05/13/17 1802)    PRN Medications: levalbuterol, ondansetron (ZOFRAN) IV, oxyCODONE, simethicone, sodium chloride flush, sodium chloride flush   Patient Profile   David [name] is a 52 y.o. male with a PMH of ICM ('09 PCI to LAD, mRCA, '10 PCI to Lcx and '12 mRCA), HFrEF (EF ~20% for >5 years) s/p HM-III VAD placement on 12/27 due to  end-stage systolic HF.   Assessment/Plan:    1. Acute on chronic systolic HF: Echo 42/68/34 with LVEF 20-25%, Mild MR, Mild/Mod MR, Severe LAE, Mildly reduced RV, PA peak  pressure 56 mm Hg.  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 Extubated, initially stopped epinephrine/norepinephrine but restarted with fall in UOP and MAP.  With progressive renal failure, pulmonary edema, and hypotension, he was re-intubated on 12/30.   CVVH was begun.  12/31 was difficult day with refractory shock (MAP in 60s).  Reviewed 12/31 echo which showed no significant pericardial effusion, adequate RV function, and IV septum near mid-line.  Off all pressors.  - Continue aspirin 81 mg daily.  - Warfarin goal INR 2-2.5. INR 4.76. Restarting  Breeze supplement - Coumadin on hold  2. CAD s/p PCI to mid RCA and PLOM with DES x 2 and DES to mid LAD in 1/18.   - Continue ASA and statin.  - No s/s ischemia 3. PAF: - Remains in NSR. 4. ARF on CKD stage III: Suspect some degree of intrinsic renal disease prior to LVAD placement (baseline creatinine around 1.5) with probable ATN from intra-op/peri-op hypotension and development of vasodilatory/septic shock.  CVVHD stopped 05/04/2017. First iHD 05/06/17 without problems.Tunneled HD cath placed 05/08/17.  -  Vein mapping done plan for AV graft placement soon but is now on coumadin with INR 4.6  5. Anemia: s/p 1 u PRBCs 12/30, 04/25/17, 1/8.  - Hgb 7.8 . Continue aranesp . May need to give 1UPRBCs.  - Want to limit transfusion with possibility of transplant down the road. Follow closely.  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 ID: Started on cefepime empirically with septic/vasodilatory shock, PCT was 31 04/25/17.  ABX broadened to Meropenem 04/27/16 with + Achromobacter on tracheal aspirate.  - Finished 05/07/17 (10 days)  PCT 8.31 04/27/17 -> 3.87 ->0.94 - Afebrile.  - on Keflex .  - WBC trending up 14.7>17. - Continue IS  7. Elevated  LFTs due to shock liver - LFTs improved but albumin low. Suspect compromised liver synthetic function. Encouraged protein intake. 8. Hyponatremia: Sodium  135  9. Driveline bleeding - Resolved after stitch placed by Dr. Cyndia Bent.  10. Constipation - limit narcotics 11. Pain - reduce oxycodone - Continue gabapentin 200 bid 12. Protein-calorie malnutrition - will ask nutrition to see - encouraged protein intake - Prealbumin 12.3.     Continue to mobilize. Watch WBC and Hgb. May need to 1UPRBCs.    Length of Stay: [place], NP 05/16/2017, 9:32 AM  VAD Team --- VAD ISSUES ONLY--- Pager [phone] (7am - 7am)  Advanced Heart Failure Team  Pager [phone] (M-F; 7a - 4p)  Please contact [facility] for night-coverage after hours (4p -7a ) and weekends on [email or web address]  Patient seen and examined with Darrick Grinder, NP. We discussed all aspects of the encounter. I agree with the assessment and plan as stated above.   Exam  Weak appearing Cor sternal site ok.  Lungs decreased at bases Ab driveline site ok  Ext: 1+ edmea  Remains weak. Still with some chest discomfort. INR remains high. Still struggling with VAD teaching. U/o picking up but still not clearing solute well. WBC up. On augmentin. Driveline site improved. Encouraged IS.  Continue to encourage po intake and mobilizing. VAD interrogated personally. Parameters stable.  Glori Bickers, MD  11:25 AM

## 2017-05-16 NOTE — Progress Notes (Signed)
Exit site care performed:  Drive line exit wound care performed by patients wife.Existing VAD dressing removed and site care performed using sterile technique.Gauze and silverhave a scantamount of old bloody drainage.Drive line exit site cleaned with Chlora prep applicators x 2, allowed to dry, andgauzedressing withsilver stripre-applied. Exit sitehealingand unincorporated, the velour is fully implanted at exit site.Small amount of reddened area noted. No tenderness, nofoul odor or rash noted.   VAD Coordinator or patients wife will change dressing tomorrow.  [name] RN, VAD Coordinator 24/7 pager [phone]

## 2017-05-16 NOTE — Progress Notes (Signed)
CRITICAL VALUE ALERT  Critical Value:  INR 4.76  Date & Time Notied:  05/16/16 0645 Provider Notified: Cyndia Bent MD  No new orders received at this time will continue to monitor.

## 2017-05-16 NOTE — Progress Notes (Signed)
Physical Therapy Treatment Patient Details Name: David [name] MRN: [id number] DOB: [birth year] Today's Date: 05/16/2017    History of Present Illness This 52 y.o. male admitted for LVAD. Pt extubated 12/28.   on 12/30, pt developed respiratory distress with pulmonary edema, and increased creatanine.  He was intubated 12/30 > 04/26/16, and CRRT initiated 12/30 and discontinued 1/10. Pt now on intermittent HD.   PMH includes:  Acute on chronic systolic HF, CAD, PAF, CKD stage III, LBBB, frequent PVCs, seizures, PNS, MI, ischemic cardiomyopathy, s/p AICD     PT Comments    Pt performed increased activity and reviewed power source transfer.  Pt required increased assistance with power source transfer, attempting to plug vad cord into battery without a clip.  Pt require step by step instructions for safety with source transfer.  Pt able to recall bringing black bag during gait training.     Follow Up Recommendations  Supervision/Assistance - 24 hour;Home health PT     Equipment Recommendations  Other (comment)(rollator ( 4 wheeled walker with a seat))    Recommendations for Other Services       Precautions / Restrictions Precautions Precautions: Fall;Sternal Precaution Comments: requires min cues for sternal precautions Restrictions Weight Bearing Restrictions: No Other Position/Activity Restrictions: sternal precautions    Mobility  Bed Mobility Overal bed mobility: Needs Assistance Bed Mobility: Supine to Sit     Supine to sit: Supervision     General bed mobility comments: Pt required cues for sternal precautions.    Transfers Overall transfer level: Needs assistance Equipment used: 4-wheeled walker Transfers: Sit to/from Stand Sit to Stand: Supervision         General transfer comment: Cues for hand on knees.  Pt holding to hand grips on rollator but did not pull into standing.    Ambulation/Gait Ambulation/Gait assistance: Supervision Ambulation Distance (Feet):  600 Feet Assistive device: 4-wheeled walker Gait Pattern/deviations: Step-through pattern;Decreased stride length;Trunk flexed Gait velocity: decr Gait velocity interpretation: Below normal speed for age/gender General Gait Details: Cues for upper trunk control, Pt with good safety with rollator.  Pt performed additonal 10 ft trial without device and presents with LOB x2 requiring min assistance.  Pt unaware with LOB and need for use of device.     Stairs            Wheelchair Mobility    Modified Rankin (Stroke Patients Only)       Balance Overall balance assessment: Needs assistance   Sitting balance-Leahy Scale: Good       Standing balance-Leahy Scale: Fair Standing balance comment: supervision for static standing with UE support.                              Cognition Arousal/Alertness: Awake/alert Behavior During Therapy: WFL for tasks assessed/performed Overall Cognitive Status: Impaired/Different from baseline Area of Impairment: Attention;Memory;Safety/judgement;Problem solving                   Current Attention Level: Selective Memory: Decreased short-term memory   Safety/Judgement: Decreased awareness of safety   Problem Solving: Difficulty sequencing;Requires verbal cues;Requires tactile cues General Comments: Pt reports feeling tired but agreeable to session.  Post gait attempted to ambulate 10 ft from door way to chair with LOB x2.  Pt after loosing balance twice was educated on the importance of using device and he reports, " I need to be independent."  Pt unaware of his safety issues.  Exercises      General Comments        Pertinent Vitals/Pain Pain Assessment: Faces Faces Pain Scale: Hurts whole lot Pain Location: sternal Pain Descriptors / Indicators: Grimacing;Sore Pain Intervention(s): Monitored during session;Repositioned    Home Living                      Prior Function            PT Goals  (current goals can now be found in the care plan section) Acute Rehab PT Goals Patient Stated Goal: To get stronger and go home  Potential to Achieve Goals: Good Progress towards PT goals: Progressing toward goals    Frequency    Min 3X/week      PT Plan Current plan remains appropriate    Co-evaluation              AM-PAC PT "6 Clicks" Daily Activity  Outcome Measure  Difficulty turning over in bed (including adjusting bedclothes, sheets and blankets)?: A Little Difficulty moving from lying on back to sitting on the side of the bed? : A Little Difficulty sitting down on and standing up from a chair with arms (e.g., wheelchair, bedside commode, etc,.)?: A Little Help needed moving to and from a bed to chair (including a wheelchair)?: A Little Help needed walking in hospital room?: A Little Help needed climbing 3-5 steps with a railing? : A Little 6 Click Score: 18    End of Session Equipment Utilized During Treatment: Oxygen Activity Tolerance: Patient tolerated treatment well Patient left: with call bell/phone within reach;in bed Nurse Communication: Mobility status PT Visit Diagnosis: Unsteadiness on feet (R26.81);Muscle weakness (generalized) (M62.81);Difficulty in walking, not elsewhere classified (R26.2)     Time: 2671-2458 PT Time Calculation (min) (ACUTE ONLY): 31 min  Charges:  $Gait Training: 8-22 mins $Therapeutic Activity: 8-22 mins                    G Codes:       [name], PTA pager [phone]    [name] 05/16/2017, 10:13 AM

## 2017-05-17 DIAGNOSIS — E43 Unspecified severe protein-calorie malnutrition: Secondary | ICD-10-CM

## 2017-05-17 LAB — CBC
HEMATOCRIT: 26.9 % — AB (ref 39.0–52.0)
HEMOGLOBIN: 8 g/dL — AB (ref 13.0–17.0)
MCH: 26.1 pg (ref 26.0–34.0)
MCHC: 29.7 g/dL — AB (ref 30.0–36.0)
MCV: 87.6 fL (ref 78.0–100.0)
Platelets: 365 10*3/uL (ref 150–400)
RBC: 3.07 MIL/uL — AB (ref 4.22–5.81)
RDW: 20.9 % — ABNORMAL HIGH (ref 11.5–15.5)
WBC: 17.6 10*3/uL — ABNORMAL HIGH (ref 4.0–10.5)

## 2017-05-17 LAB — RENAL FUNCTION PANEL
ALBUMIN: 1.6 g/dL — AB (ref 3.5–5.0)
ANION GAP: 12 (ref 5–15)
ANION GAP: 10 (ref 5–15)
Albumin: 1.6 g/dL — ABNORMAL LOW (ref 3.5–5.0)
BUN: 11 mg/dL (ref 6–20)
BUN: 14 mg/dL (ref 6–20)
CALCIUM: 8.6 mg/dL — AB (ref 8.9–10.3)
CHLORIDE: 94 mmol/L — AB (ref 101–111)
CHLORIDE: 96 mmol/L — AB (ref 101–111)
CO2: 28 mmol/L (ref 22–32)
CO2: 32 mmol/L (ref 22–32)
Calcium: 8.6 mg/dL — ABNORMAL LOW (ref 8.9–10.3)
Creatinine, Ser: 3.66 mg/dL — ABNORMAL HIGH (ref 0.61–1.24)
Creatinine, Ser: 4.19 mg/dL — ABNORMAL HIGH (ref 0.61–1.24)
GFR calc non Af Amer: 18 mL/min — ABNORMAL LOW (ref >60)
GFR, EST AFRICAN AMERICAN: 20 mL/min — AB (ref >60)
GFR, EST AFRICAN AMERICAN: 17 mL/min — AB (ref >60)
GFR, EST NON AFRICAN AMERICAN: 15 mL/min — AB (ref >60)
GLUCOSE: 89 mg/dL (ref 65–99)
Glucose, Bld: 124 mg/dL — ABNORMAL HIGH (ref 65–99)
PHOSPHORUS: 2.7 mg/dL (ref 2.5–4.6)
POTASSIUM: 3.2 mmol/L — AB (ref 3.5–5.1)
POTASSIUM: 3.5 mmol/L (ref 3.5–5.1)
Phosphorus: 2.7 mg/dL (ref 2.5–4.6)
SODIUM: 134 mmol/L — AB (ref 135–145)
Sodium: 138 mmol/L (ref 135–145)

## 2017-05-17 LAB — PROTIME-INR
INR: 3.81
Prothrombin Time: 37.2 seconds — ABNORMAL HIGH (ref 11.4–15.2)

## 2017-05-17 LAB — LACTATE DEHYDROGENASE: LDH: 231 U/L — ABNORMAL HIGH (ref 98–192)

## 2017-05-17 LAB — MAGNESIUM: Magnesium: 1.4 mg/dL — ABNORMAL LOW (ref 1.7–2.4)

## 2017-05-17 MED ORDER — MAGNESIUM SULFATE 4 GM/100ML IV SOLN
4.0000 g | Freq: Once | INTRAVENOUS | Status: AC
  Administered 2017-05-17: 4 g via INTRAVENOUS
  Filled 2017-05-17: qty 100

## 2017-05-17 MED ORDER — POTASSIUM CHLORIDE CRYS ER 20 MEQ PO TBCR
20.0000 meq | EXTENDED_RELEASE_TABLET | Freq: Once | ORAL | Status: AC
  Administered 2017-05-17: 20 meq via ORAL
  Filled 2017-05-17: qty 1

## 2017-05-17 NOTE — Progress Notes (Signed)
Patient ID: David [name], male   DOB: 03-22-1966, 52 y.o.   MRN: [id number]   Advanced Heart Failure VAD Team Note  Subjective:    David [name] is a 52 y.o. male with a PMH of ICM ('09 PCI to LAD, mRCA, '10 PCI to Lcx and '12 mRCA), HFrEF (EF ~20% for >5 years), HLD and HTN admitted 12/26 for VAD placement due to end-stage systolic HF.   - Underwent successful HM-3 LVAD placement 04/20/17. 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 CVVHD started 12/30 - Started on Meropenem 04/27/16 with + Achromobacter in tracheal aspirate. (Finished 05/07/17) - CVVHD stopped 1/10 - 1st iHD 1/12 - Tunneled HD cath placed 05/08/17 - Started on Keflex for mild drainage from Driveline 5/36/46  Making >1.5 liters of urine. Creatinine 3.49-> 3.66 Walked 850 feet. Starting to feel stronger. WBC still up. No fevers. Doing IS. INR 3.81  Denies SOB/CP.   Echo 12/31 reviewed: No significant pericardial effusion, IV septum midline to slightly to the right, RV function adequate.  LVAD INTERROGATION:  HeartMate III LVAD:  Flow 5.1  liters/min, speed 5400, power 4, PI 3.3  .   Objective:    Vital Signs:   Temp:  [97.9 F (36.6 C)-98.7 F (37.1 C)] 98.2 F (36.8 C) (01/23 1216) Pulse Rate:  [67-103] 103 (01/23 1216) Resp:  [12-20] 19 (01/23 1216) BP: (84-106)/(72-90) 84/72 (01/23 1216) SpO2:  [93 %-100 %] 93 % (01/23 1216) Weight:  [154 lb 15.7 oz (70.3 kg)] 154 lb 15.7 oz (70.3 kg) (01/23 0355) Last BM Date: 05/14/17 Mean arterial Pressure 70-80s  Intake/Output:   Intake/Output Summary (Last 24 hours) at 05/17/2017 1316 Last data filed at 05/17/2017 0923 Gross per 24 hour  Intake 490 ml  Output 1850 ml  Net -1360 ml   Physical Exam  Maps 70-90s  Physical Exam: GENERAL: NAD.Sitting up in bed.  HEENT: normal anicteric NECK: Supple, JVP 10-11 .  2+ bilaterally, no bruits.  No lymphadenopathy or thyromegaly appreciated.   CARDIAC:  Mechanical heart sounds with LVAD hum present. R upper chest HD  catheter.  LUNGS:  Clear to auscultation bilaterally.  ABDOMEN:  Soft, round, nontender, positive bowel sounds x4.     LVAD exit site: Dressing dry and intact.  No erythema or drainage.  Stabilization device present and accurately applied.  Driveline dressing is being changed daily per sterile technique. EXTREMITIES:  Warm and dry, no cyanosis, clubbing, rash, trace  edema  NEUROLOGIC:  Alert and oriented x 4.  Gait steady.  No aphasia.  No dysarthria.  Affect pleasant.      Telemetry    NSR 80-90s personally reviewed.    Labs   Basic Metabolic Panel: Recent Labs  Lab 05/13/17 0332  05/14/17 0646  05/15/17 0511 05/15/17 1739 05/16/17 0336 05/16/17 2010 05/17/17 0503  NA 132*   < > 135   < > 135 135 135 133* 134*  K 3.5   < > 3.4*   < > 2.8* 3.3* 3.3* 3.3* 3.2*  CL 95*   < > 97*   < > 94* 98* 99* 97* 94*  CO2 27   < > 28   < > 30 27 27 27 28   GLUCOSE 97   < > 87   < > 89 84 93 104* 89  BUN 12   < > 5*   < > 10 <5* <5* 9 11  CREATININE 4.00*   < > 2.93*   < > 3.78* 1.73* 2.62*  3.49* 3.66*  CALCIUM 8.8*   < > 8.6*   < > 8.8* 7.7* 8.0* 8.2* 8.6*  MG 1.8  --  1.6*  --  1.5*  --  1.6*  --  1.4*  PHOS 4.7*   < > 2.7   < > 2.6 1.2* 1.9* 2.7 2.7   < > = values in this interval not displayed.    Liver Function Tests: Recent Labs  Lab 05/15/17 0511 05/15/17 1739 05/16/17 0336 05/16/17 2010 05/17/17 0503  ALBUMIN 1.7* 1.8* 1.5* 1.6* 1.6*   No results for input(s): LIPASE, AMYLASE in the last 168 hours. No results for input(s): AMMONIA in the last 168 hours.  CBC: Recent Labs  Lab 05/13/17 0332 05/14/17 0646 05/15/17 0511 05/16/17 0336 05/17/17 0503  WBC 13.4* 14.1* 14.7* 17.1* 17.6*  HGB 8.4* 7.9* 8.5* 7.8* 8.0*  HCT 26.7* 26.2* 27.7* 25.8* 26.9*  MCV 85.3 86.2 86.8 86.6 87.6  PLT 340 322 379 347 365    INR: Recent Labs  Lab 05/13/17 0332 05/14/17 0646 05/15/17 0511 05/16/17 0336 05/17/17 0503  INR 2.52 3.72 4.60* 4.76* 3.81    Other  results:  Imaging   No results found.   Medications:     Scheduled Medications: . amoxicillin-clavulanate  1 tablet Oral Daily  . aspirin EC  81 mg Oral Daily  . atorvastatin  40 mg Oral q1800  . chlorhexidine  15 mL Mouth Rinse BID  . citalopram  10 mg Oral Daily  . darbepoetin (ARANESP) injection - DIALYSIS  200 mcg Intravenous Q Thu-HD  . feeding supplement  1 Container Oral BID BM  . feeding supplement (PRO-STAT SUGAR FREE 64)  30 mL Oral QHS  . ferrous [id number] C-folic acid  1 capsule Oral BID BM  . gabapentin  200 mg Oral BID  . mouth rinse  15 mL Mouth Rinse q12n4p  . pantoprazole  40 mg Oral Daily  . polyethylene glycol  17 g Oral Daily  . torsemide  60 mg Oral BID  . Warfarin - Pharmacist Dosing Inpatient   Does not apply q1800    Infusions: . sodium chloride Stopped (04/30/17 0800)  . ferric gluconate (FERRLECIT/NULECIT) IV Stopped (05/13/17 1802)    PRN Medications: levalbuterol, ondansetron (ZOFRAN) IV, oxyCODONE, simethicone, sodium chloride flush, sodium chloride flush   Patient Profile   David [name] is a 52 y.o. male with a PMH of ICM ('09 PCI to LAD, mRCA, '10 PCI to Lcx and '12 mRCA), HFrEF (EF ~20% for >5 years) s/p HM-III VAD placement on 12/27 due to end-stage systolic HF.   Assessment/Plan:    1. Acute on chronic systolic HF: Echo 54/00/86 with LVEF 20-25%, Mild MR, Mild/Mod MR, Severe LAE, Mildly reduced RV, PA peak pressure 56 mm Hg.  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 Extubated, initially stopped epinephrine/norepinephrine but restarted with fall in UOP and MAP.  With progressive renal failure, pulmonary edema, and hypotension, he was re-intubated on 12/30.   CVVH was begun.  12/31 was difficult day with refractory shock (MAP in 60s).  Reviewed 12/31 echo which showed no significant pericardial effusion, adequate RV function, and IV septum near mid-line.  Off all pressors. Maps 70-90s  -  Continue aspirin 81 mg daily.  - Warfarin goal INR 2-2.5. INR 3.8  - Coumadin on hold  2. CAD s/p PCI to mid RCA and PLOM with DES x 2 and DES to mid LAD in 1/18.   - Continue ASA  and statin.  -no S/S ischemia.  3. PAF: - Remains in NSR. 4. ARF on CKD stage III: Suspect some degree of intrinsic renal disease prior to LVAD placement (baseline creatinine around 1.5) with probable ATN from intra-op/peri-op hypotension and development of vasodilatory/septic shock.  CVVHD stopped 05/04/2017. First iHD 05/06/17 without problems.Tunneled HD cath placed 05/08/17.  For HD tomorrow.  -  Vein mapping done plan for AV graft placement soon but is now on coumadin with INR  3.8  5. Anemia: s/p 1 u PRBCs 12/30, 04/25/17, 1/8.  -Hgb 8. Continue aranesp .  - Want to limit transfusion with possibility of transplant down the road. Follow closely.  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 ID: Started on cefepime empirically with septic/vasodilatory shock, PCT was 31 04/25/17.  ABX broadened to Meropenem 04/27/16 with + Achromobacter on tracheal aspirate.  - Finished 05/07/17 (10 days)  PCT 8.31 04/27/17 -> 3.87 ->0.94 - Afebrile.  - now on augmentin.  - WBC trending up 14.7>1>17.6  - Continue IS  7. Elevated LFTs due to shock liver - LFTs improved but albumin low. Suspect compromised liver synthetic function. Encouraged protein intake. 8. Hyponatremia Sodium 134 9. Driveline bleeding - Resolved after stitch placed by Dr. Cyndia Bent.  10. Constipation - limit narcotics 11. Pain - reduce oxycodone - Continue gabapentin 200 bid 12. Protein-calorie malnutrition - will ask nutrition to see - encouraged protein intake - Prealbumin 12.3.      Length of Stay: [place], NP 05/17/2017, 1:16 PM  VAD Team --- VAD ISSUES ONLY--- Pager [phone] (7am - 7am)  Advanced Heart Failure Team  Pager [phone] (M-F; 7a - 4p)  Please contact [facility] for night-coverage after hours (4p -7a ) and weekends  on [email or web address]  Patient seen and examined with Darrick Grinder, NP. We discussed all aspects of the encounter. I agree with the assessment and plan as stated above.   Urine output continues to pick up. Creatinine rise between HD starting to flatten out. Discussed with Nephrology and we will hold off on graft placement for now. Volume status mildly elevated but not too bad. WBC still at 17k but looks like it is starting to plateau. Encouraged IS. Driveline site ok. Finishing Augmentin. Encouraged to ambulate more.   Prealbumin quite low. Will have nutrition see. Emphasized need for increased po intake. VAD interrogated personally. Parameters stable.  INR 3.8. Discussed dosing with PharmD personally.  Glori Bickers, MD  3:14 PM

## 2017-05-17 NOTE — Progress Notes (Signed)
[facility] Progress Note    Assessment/ Plan:    62V with systolic CHF s/p LVAD 03/50  with AKI  On CKD--> ESRD, new-start HD.    1.  AKI on CKD III--> ESRD: likely due to ATN from shock s/p LVAD placement with underlying arterionephrosclerosis.  Required CRRT initially and then transitioned to IHD (1/11).  S/p TDC with IR 05/08/17.  UOP OK (1500 yest) but no real signs of GFR recovery yet, last HD yest 1/21- s/p vein mapping 05/12/17, was considering  perm access placement but I have discussed with cards and the plan is to table that for right now. He wants to table b/c he feels renal function will recover, however I do not know if would be worth putting him through that  because I dont know this pts long term outlook.  Next HD planned HD for 1/23= Wednesday- in room  2.  Acute on chronic systolic CHF: s/p Heartmate III 12/27.  Advanced HF and cardiothoracic surgery following.  He required a stitch for driveline bleeding 0/93.  Coumadin has been restarted (1/14).  On torsemide 60 BID to augment UOP- agree, this will help keep fluid gains down in between HD and prevent such large UF goals which could induce hypotension.  3.  Elevated LFTs/ Hepatic dysfunction- labile INRs,  Per primary  4.  Hyponatremia: vol related,  Improving.   5.  Achromobacter pneumonia: s/p antibiotics (ended 1/13, 10 days)  6.  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 Hypokalemia- on high K bath-   8. Anemia- iron stores low, giving iron - on darbe 200 q week  9. Check PTH, phos OK no binder - actually low  Subjective:    No new complaints   Objective:   BP 106/90 (BP Location: Left Arm)   Pulse 86   Temp 97.9 F (36.6 C) (Oral)   Resp 18   Ht 5\' 5"  (1.651 m)   Wt 70.3 kg (154 lb 15.7 oz)   SpO2 100%   BMI 25.79 kg/m   Intake/Output Summary (Last 24 hours) at 05/17/2017 8182 Last data filed at 05/17/2017 0400 Gross per 24 hour  Intake 450 ml  Output 1575 ml  Net -1125 ml   Weight  change: -1.6 kg (-8.4 oz)  Physical Exam: XHB:ZJIRC gentleman, NAD, lying in bed HEENT: some JVD upright CVS: mechanical hum of LVAD Resp: normal WOB today, clear anteriorly, some muffled bilateral bses Abd: + drive line LUQ, dressed Ext:1+ LE edema, improved ACCESS: R IJ TDC In place NEURO: mild asterixis, AAO x 3 and able to appropriately answer questions  Imaging: Dg Chest 2 View  Result Date: 05/15/2017 CLINICAL DATA:  Elevated white blood cell count EXAM: CHEST  2 VIEW COMPARISON:  05/07/2017 FINDINGS: LVAD, left pacer remain in place, unchanged. Placement of new internal jugular dialysis catheter on the right. The tips are in the right atrium. No pneumothorax. Cardiomegaly with vascular congestion. Airspace opacity noted posteriorly at the bases on the lateral view could reflect atelectasis or infiltrate. IMPRESSION: Cardiomegaly, vascular congestion. Posterior airspace opacity at the bases on the lateral view could reflect atelectasis or infiltrate, suspect in the left lung base. Electronically Signed   By: Rolm Baptise M.D.   On: 05/15/2017 12:26    Labs: BMET Recent Labs  Lab 05/14/17 0646 05/14/17 1520 05/15/17 0511 05/15/17 1739 05/16/17 0336 05/16/17 2010 05/17/17 0503  NA 135 134* 135 135 135 133* 134*  K 3.4* 3.2* 2.8* 3.3* 3.3* 3.3* 3.2*  CL 97* 97* 94* 98* 99* 97* 94*  CO2 28 27 30 27 27 27 28   GLUCOSE 87 70 89 84 93 104* 89  BUN 5* 6 10 <5* <5* 9 11  CREATININE 2.93* 3.20* 3.78* 1.73* 2.62* 3.49* 3.66*  CALCIUM 8.6* 8.5* 8.8* 7.7* 8.0* 8.2* 8.6*  PHOS 2.7 1.9* 2.6 1.2* 1.9* 2.7 2.7   CBC Recent Labs  Lab 05/14/17 0646 05/15/17 0511 05/16/17 0336 05/17/17 0503  WBC 14.1* 14.7* 17.1* 17.6*  HGB 7.9* 8.5* 7.8* 8.0*  HCT 26.2* 27.7* 25.8* 26.9*  MCV 86.2 86.8 86.6 87.6  PLT 322 379 347 365    Medications:    . amoxicillin-clavulanate  1 tablet Oral Daily  . aspirin EC  81 mg Oral Daily  . atorvastatin  40 mg Oral q1800  . chlorhexidine  15 mL  Mouth Rinse BID  . citalopram  10 mg Oral Daily  . darbepoetin (ARANESP) injection - DIALYSIS  200 mcg Intravenous Q Thu-HD  . feeding supplement  1 Container Oral BID BM  . feeding supplement (PRO-STAT SUGAR FREE 64)  30 mL Oral QHS  . ferrous [id number] C-folic acid  1 capsule Oral BID BM  . gabapentin  200 mg Oral BID  . mouth rinse  15 mL Mouth Rinse q12n4p  . pantoprazole  40 mg Oral Daily  . polyethylene glycol  17 g Oral Daily  . torsemide  60 mg Oral BID  . Warfarin - Pharmacist Dosing Inpatient   Does not apply q1800      David [name] A  05/17/2017, 8:28 AM

## 2017-05-17 NOTE — Progress Notes (Signed)
Physical Therapy Treatment Patient Details Name: David [name] MRN: [id number] DOB: 1966/02/21 Today's Date: 05/17/2017    History of Present Illness This 52 y.o. male admitted for LVAD. Pt extubated 12/28.   on 12/30, pt developed respiratory distress with pulmonary edema, and increased creatanine.  He was intubated 12/30 > 04/26/16, and CRRT initiated 12/30 and discontinued 1/10. Pt now on intermittent HD.   PMH includes:  Acute on chronic systolic HF, CAD, PAF, CKD stage III, LBBB, frequent PVCs, seizures, PNS, MI, ischemic cardiomyopathy, s/p AICD     PT Comments    Pt refused OOB mobility as he reports he is waiting to go to dialysis.  Pt however agreeable to practice LVAD power source transfer and review precautions.  Pt performed transfer from wall power source to battery with min cues for technique and problem solving.  Pt overall improved with physical capabilities to manipulate the equipment but remains to require re-education on recall of contents in black bag, what power source he should be on when sleeping, and what to do in an emergency.  Pt slightly agitated when he is corrected in regards to education.  Plan next session for continued education with power source transfer and functional mobility as patient is willing.      Follow Up Recommendations  Supervision/Assistance - 24 hour;Home health PT     Equipment Recommendations  Other (comment)(rollator ( 4 wheeled RW with a seat) )    Recommendations for Other Services       Precautions / Restrictions Precautions Precautions: Fall;Sternal Precaution Comments: remphasized the need to be aware of drive line Restrictions Weight Bearing Restrictions: No Other Position/Activity Restrictions: sternal precautions    Mobility  Bed Mobility Overal bed mobility: (refused mobility but agreeable to practice power source transfer.  )                Transfers                    Ambulation/Gait                  Stairs            Wheelchair Mobility    Modified Rankin (Stroke Patients Only)       Balance Overall balance assessment: (NT)                                          Cognition Arousal/Alertness: Awake/alert Behavior During Therapy: WFL for tasks assessed/performed Overall Cognitive Status: Impaired/Different from baseline Area of Impairment: Attention;Memory;Problem solving;Safety/judgement                   Current Attention Level: Selective(TV off during power source change) Memory: Decreased short-term memory   Safety/Judgement: Decreased awareness of safety   Problem Solving: Difficulty sequencing;Requires verbal cues General Comments: Pt unable to recall what power source he should be on when sleeping, he also reported it was okay to remove both power cords at the same time.  Pt required education on siwtching one source at at time.       Exercises Other Exercises Other Exercises: VAD power source transfer, cues for recall of sequencing and cues for technique to improve ease.  Pt remains inconsistent with his recall.      General Comments        Pertinent Vitals/Pain Pain Assessment: No/denies pain    Home Living  Prior Function            PT Goals (current goals can now be found in the care plan section) Acute Rehab PT Goals Patient Stated Goal: To get stronger and go home  Potential to Achieve Goals: Good(from a mobility standpoint.  ) Progress towards PT goals: Progressing toward goals;Not progressing toward goals - comment(Pt progressing toward mobility goals but remains unable to transfer power sources independently.  )    Frequency    Min 3X/week      PT Plan Current plan remains appropriate    Co-evaluation              AM-PAC PT "6 Clicks" Daily Activity  Outcome Measure  Difficulty turning over in bed (including adjusting bedclothes, sheets and blankets)?: A  Little Difficulty moving from lying on back to sitting on the side of the bed? : A Little Difficulty sitting down on and standing up from a chair with arms (e.g., wheelchair, bedside commode, etc,.)?: A Little Help needed moving to and from a bed to chair (including a wheelchair)?: A Little Help needed walking in hospital room?: A Little Help needed climbing 3-5 steps with a railing? : A Little 6 Click Score: 18    End of Session Equipment Utilized During Treatment: Oxygen Activity Tolerance: Patient tolerated treatment well Patient left: with call bell/phone within reach;in bed Nurse Communication: (memory deficits with LVAD power source transfer) PT Visit Diagnosis: Unsteadiness on feet (R26.81);Muscle weakness (generalized) (M62.81);Difficulty in walking, not elsewhere classified (R26.2)     Time: 1445-1500 PT Time Calculation (min) (ACUTE ONLY): 15 min  Charges:  $Therapeutic Activity: 8-22 mins                    G CodesGovernor [name], PTA pager [phone]    David [name] 05/17/2017, 5:17 PM

## 2017-05-17 NOTE — Progress Notes (Signed)
LVAD Coordinator Rounding Note:  Admitted12/26due to decompensated heart failure.  Heartmate IIILVAD implanted on 12/27/18by [facility] DTcriteria.AV valve oversewn due to aortic insufficiency.  Drive line dressing CDI with anchor appropriately placed this morning.   Vital signs: Tmax: 97.9 HR:86 Doppler Pressure: 96 Automatic cuff: 106/90 (98) O2 Sat:100 % on3LNC Wt:165>164>156>160>160>154>158>156>157>160>164>167>169>167>168>167>158>157>lbs  LVAD interrogation reveals:  NXGZF:5825 Flow: 4.9 Power:4 w PI:3.4 Alarms:none Events:none Hematocrit:27 Fixed speed:5400 Low speed limit:5100  Labs:  LDH trend:205>407>576>923>967>612>528>492>467>489>441>434>405>376>355>315>265>263>281>293>224>231  INRtrend:1.34>1.52>1.73>1.95>2.32>3.03>3.80>3.16>2.39>2.23>1.70>1.65>1.91>1.60>1.43>1.49>1.63>1.99>1.97>2.52>3.72>4.60>4.76>3.81  Anticoagulation Plan: -INR Goal:2-2.5 -ASA Dose: 81 mg   Blood Products: Intra op:2 units/FFP 04/20/17  Post PG:FQMK'J 1 unit 12/29 1 unit 12/30 1 unit 04/25/17 1 unit 05/02/17 1 unit 05/08/17  Device:Medtronic BiV -Therapies:on  Arrhythmia: 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 (on amiodarone and Eliquis for this) - Afib 12/29 - 04/28/17 - NSR 1/5 - present  Nitric Oxide: - stopped on 12/30 due to initiation of BiPap  Respiratory: - Extubated 12/28 - Reintubated 04/23/2017 - Extubated 04/26/2017  Renal:  04/23/17 - CRRT started for AKI - stopped 05/05/17 -Intermittent HD Monday, Wednesday, Friday  Infection: - 04/27/17 + 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 If unable, will personally change. Staff RN, please page  the [place] Coordinator. 2.Please call VAD Pager for any equipment concerns or drive line dressing questions/concerns.   [name] RN, VAD Coordinator 24/7 pager [phone]

## 2017-05-17 NOTE — Progress Notes (Signed)
CARDIAC REHAB PHASE I   PRE:  Rate/Rhythm: 87 SR    BP: sitting 92    SaO2: 97 3L  MODE:  Ambulation: 850 ft   POST:  Rate/Rhythm: 95 SR    BP: sitting 96      SaO2: 83 1L ? accuracy, 95 1L after rest   Pt asleep. Willing to walk. Stood quickly to urinate and pulled driveline/system controller. Anchored attached. I had just talked with him about watching his system controller, esp if it is not attached to him. Pts mobility is good, no problems today, able to stay close to rollator. SaO2 difficult to register. Walked 2nd lap on 1L and monitor registered 83 1L initially, up to 95 1L. ? Accuracy. Sts he is slightly SOB walking. He had a few errors today with transferring to batteries and then back to power cord. Most common mistake is trying to connect battery to power cord instead of system controller. Biggest mistake today was when he had attached one cord to power cord, got distracted by the tv, then disconnected power cord again, laid it down and was planning to disconnect other battery (didn't get this far before I corrected him). We discussed making transfers his top focus (instead of tv, conversation, etc). OT came when I was leaving and we discussed all of this. Will f/u. Left on 1L. [phone]  Darrick Meigs CES, ACSM 05/17/2017 12:08 PM

## 2017-05-17 NOTE — Progress Notes (Signed)
Occupational Therapy Treatment Patient Details Name: David [name] MRN: [id number] DOB: 07-Nov-1965 Today's Date: 05/17/2017    History of present illness This 52 y.o. male admitted for LVAD. Pt extubated 12/28.   on 12/30, pt developed respiratory distress with pulmonary edema, and increased creatanine.  He was intubated 12/30 > 04/26/16, and CRRT initiated 12/30 and discontinued 1/10. Pt now on intermittent HD.   PMH includes:  Acute on chronic systolic HF, CAD, PAF, CKD stage III, LBBB, frequent PVCs, seizures, PNS, MI, ischemic cardiomyopathy, s/p AICD    OT comments  Minimized distractions while pt switching power sources as per cardiac rehab's report of pt distractible. Pt requiring minimal verbal cues for sequencing. Pt performing standing grooming and dressing with supervision. Encouraged use of incentive spirometer 10x per hour. Pt on 1L 02 throughout session with sats in low 90s.  Follow Up Recommendations  Home health OT;Supervision/Assistance - 24 hour    Equipment Recommendations  Tub/shower seat    Recommendations for Other Services      Precautions / Restrictions Precautions Precautions: Fall;Sternal Precaution Comments: pt stating and adhering to sternal precautions       Mobility Bed Mobility               General bed mobility comments: pt in chair  Transfers Overall transfer level: Needs assistance Equipment used: 4-wheeled walker Transfers: Sit to/from Stand Sit to Stand: Supervision         General transfer comment: good technique    Balance     Sitting balance-Leahy Scale: Good       Standing balance-Leahy Scale: Fair                             ADL either performed or assessed with clinical judgement   ADL Overall ADL's : Needs assistance/impaired     Grooming: Wash/dry hands;Wash/dry face;Brushing hair;Standing;Supervision/safety           Upper Body Dressing : Minimal assistance;Sitting   Lower Body Dressing:  Supervision/safety;Sit to/from stand   Toilet Transfer: Supervision/safety;Ambulation;RW;BSC   Toileting- Water quality scientist and Hygiene: Supervision/safety;Sit to/from stand       Functional mobility during ADLs: Supervision/safety;Rolling walker General ADL Comments: educated in energy conservation, pt requiring min verbal cues for power source change, agreeable to wife assisting him at home      Vision       Perception     Praxis      Cognition Arousal/Alertness: Awake/alert Behavior During Therapy: WFL for tasks assessed/performed Overall Cognitive Status: Impaired/Different from baseline Area of Impairment: Attention;Memory;Safety/judgement;Problem solving                   Current Attention Level: Selective(turned TV off during power source change) Memory: Decreased short-term memory   Safety/Judgement: Decreased awareness of safety   Problem Solving: Difficulty sequencing;Requires verbal cues General Comments: pt able to respond appropriately to questions about what power source to use when sleeping, what to do in a power outtage, avoiding submersion, what is in his black bag.        Exercises     Shoulder Instructions       General Comments      Pertinent Vitals/ Pain       Pain Assessment: No/denies pain  Home Living  Prior Functioning/Environment              Frequency  Min 2X/week        Progress Toward Goals  OT Goals(current goals can now be found in the care plan section)  Progress towards OT goals: Progressing toward goals  Acute Rehab OT Goals Patient Stated Goal: To get stronger and go home  OT Goal Formulation: With patient Time For Goal Achievement: 05/26/17 Potential to Achieve Goals: Good  Plan Discharge plan remains appropriate    Co-evaluation                 AM-PAC PT "6 Clicks" Daily Activity     Outcome Measure   Help from another  person eating meals?: None Help from another person taking care of personal grooming?: A Little Help from another person toileting, which includes using toliet, bedpan, or urinal?: A Little Help from another person bathing (including washing, rinsing, drying)?: A Little Help from another person to put on and taking off regular upper body clothing?: A Little Help from another person to put on and taking off regular lower body clothing?: A Little 6 Click Score: 19    End of Session    OT Visit Diagnosis: Unsteadiness on feet (R26.81);Other symptoms and signs involving cognitive function   Activity Tolerance     Patient Left     Nurse Communication          Time: 9509-3267 OT Time Calculation (min): 19 min  Charges: OT General Charges $OT Visit: 1 Visit OT Treatments $Self Care/Home Management : 8-22 mins  05/17/2017 Nestor Lewandowsky, OTR/L Pager: [phone] David [name] 05/17/2017, 12:42 PM

## 2017-05-17 NOTE — Progress Notes (Signed)
David [name] for Coumadin Indication: LVAD  Allergies  Allergen Reactions  . Plavix [Clopidogrel Bisulfate] Hives   Patient Measurements: Height: 5\' 5"  (165.1 cm) Weight: 154 lb 15.7 oz (70.3 kg) IBW/kg (Calculated) : 61.5  Vital Signs: Temp: 97.9 F (36.6 C) (01/23 0746) Temp Source: Oral (01/23 0746) BP: 106/90 (01/23 0746) Pulse Rate: 86 (01/23 0746)  Labs: Recent Labs    05/15/17 0511  05/16/17 0336 05/16/17 2010 05/17/17 0503  HGB 8.5*  --  7.8*  --  8.0*  HCT 27.7*  --  25.8*  --  26.9*  PLT 379  --  347  --  365  LABPROT 43.1*  --  44.3*  --  37.2*  INR 4.60*  --  4.76*  --  3.81  CREATININE 3.78*   < > 2.62* 3.49* 3.66*   < > = values in this interval not displayed.   Estimated Creatinine Clearance: 20.3 mL/min (A) (by C-G formula based on SCr of 3.66 mg/dL (H)).  Assessment: 52 yo male s/p LVAD implantation 12/27.   Heparin drip stopped 1/17  Warfarin held for 3 days INR now down 4.6 > 3.8.  No overt bleeding noted. Hgb low stable 8  LDH 200s has been stable. Will hold warfarin again today - plan for HD graft this week when INR low enough - will need to bridge with heparin when INR < 2 Not eating much - instructed to drink ensure or boost every day   Goal of Therapy:  INR 2-2.5 Monitor platelets by anticoagulation protocol: Yes    Plan: Hold warfarin again tonight Daily PT/INR   Bonnita Nasuti Pharm.D. CPP, BCPS Clinical Pharmacist [phone] 05/17/2017 10:21 AM

## 2017-05-17 NOTE — Progress Notes (Signed)
Exit site care performed:  Drive line exit wound care performed by patients wife.Existing VAD dressing removed and site care performed using sterile technique.Gauze and silverhave a scantamount of old bloody drainage.Drive line exit site cleaned with Chlora prep applicators x 2, allowed to dry, andgauzedressing withsilver stripre-applied. Exit sitehealingand unincorporated, the velour is fully implanted at exit site.Small amountof reddened area noted again today. No tenderness, nofoul odor or rash noted.    VAD Coordinator or patients wife will change dressing tomorrow. Plan is to complete VAD education tomorrow as well.  [name] RN, VAD Coordinator 24/7 pager [phone]

## 2017-05-17 NOTE — Progress Notes (Signed)
Continue current care plan 

## 2017-05-17 NOTE — Care Management Note (Addendum)
Case Management Note Previous Note Created by [name]  Patient Details  Name: Jaquari Reckner MRN: [id number] Date of Birth: 1965-06-05  Subjective/Objective:  S/p LVAD implant 12/27, conts on dobutamine, co - x 81%, sob this am, urine output is less than 100cc in 24 hrs, CRRT was stopped on 1/10, transitioning to IHD, to place picc. He was already active with [facility] , will resume for PT and OT , but will need to see if still needs rollator at dc.     1/14 [name] RN, BSN- tunneled cathter placed today, bleeding around VAD drive site, hepain on hold, he had his first dialysis on 1/12,will need IHD, has minimal urine output, conts on dobutamine.  Remains hopeful to regain renal function but looking slimmer per MD notes.  1/16 [name] RN, BSN - POD 21 LVAD implant, conts with IHD, dobutamie off, hgb 7.8, conts on heaprin , lasix iv x 1 .  Discussed in LOS.  Patient is active with [facility] , he would like to stay with them when he goes home with [facility] for PT/OT, will need resume orders at that time.                     Action/Plan: PTA Pt lived at home with spouse- was active with [facility] for home Dobutamine-HH services- PCP- Gilford Rile-  CM will follow post VAD implant for transition needs- anticipate return home with wife.      Expected Discharge Date:                  Expected Discharge Plan:  [place]  In-House Referral:     Discharge planning Services  CM Consult  Post Acute Care Choice:  Home Health, Resumption of Svcs/PTA Provider Choice offered to:  Patient  DME Arranged:    DME Agency:     HH Arranged:  RN, PT, OT [place] Agency:  [place]  Status of Service:  In process, will continue to follow  If discussed at Long Length of Stay Meetings, dates discussed:    Additional Comments: 05/17/2017 CM ordered rollator from [facility]  [facility] aware that orders are in for [facility].   Pt is off all drips Maryclare Labrador, RN 05/17/2017, 10:58 AM

## 2017-05-18 LAB — PROTIME-INR
INR: 3.33
Prothrombin Time: 33.6 seconds — ABNORMAL HIGH (ref 11.4–15.2)

## 2017-05-18 LAB — LACTATE DEHYDROGENASE: LDH: 259 U/L — ABNORMAL HIGH (ref 98–192)

## 2017-05-18 LAB — RENAL FUNCTION PANEL
ALBUMIN: 1.6 g/dL — AB (ref 3.5–5.0)
Albumin: 1.6 g/dL — ABNORMAL LOW (ref 3.5–5.0)
Anion gap: 14 (ref 5–15)
Anion gap: 8 (ref 5–15)
BUN: 18 mg/dL (ref 6–20)
BUN: 5 mg/dL — ABNORMAL LOW (ref 6–20)
CALCIUM: 8.7 mg/dL — AB (ref 8.9–10.3)
CHLORIDE: 97 mmol/L — AB (ref 101–111)
CO2: 31 mmol/L (ref 22–32)
CO2: 28 mmol/L (ref 22–32)
CREATININE: 2.11 mg/dL — AB (ref 0.61–1.24)
Calcium: 7.7 mg/dL — ABNORMAL LOW (ref 8.9–10.3)
Chloride: 91 mmol/L — ABNORMAL LOW (ref 101–111)
Creatinine, Ser: 3.96 mg/dL — ABNORMAL HIGH (ref 0.61–1.24)
GFR calc Af Amer: 18 mL/min — ABNORMAL LOW (ref >60)
GFR calc non Af Amer: 16 mL/min — ABNORMAL LOW (ref >60)
GFR calc non Af Amer: 34 mL/min — ABNORMAL LOW (ref >60)
GFR, EST AFRICAN AMERICAN: 40 mL/min — AB (ref >60)
GLUCOSE: 75 mg/dL (ref 65–99)
Glucose, Bld: 114 mg/dL — ABNORMAL HIGH (ref 65–99)
PHOSPHORUS: 3.2 mg/dL (ref 2.5–4.6)
POTASSIUM: 2.9 mmol/L — AB (ref 3.5–5.1)
Phosphorus: 1.6 mg/dL — ABNORMAL LOW (ref 2.5–4.6)
Potassium: 3.4 mmol/L — ABNORMAL LOW (ref 3.5–5.1)
SODIUM: 136 mmol/L (ref 135–145)
Sodium: 133 mmol/L — ABNORMAL LOW (ref 135–145)

## 2017-05-18 LAB — CBC
HCT: 27 % — ABNORMAL LOW (ref 39.0–52.0)
Hemoglobin: 8.2 g/dL — ABNORMAL LOW (ref 13.0–17.0)
MCH: 26.2 pg (ref 26.0–34.0)
MCHC: 30.4 g/dL (ref 30.0–36.0)
MCV: 86.3 fL (ref 78.0–100.0)
Platelets: 364 10*3/uL (ref 150–400)
RBC: 3.13 MIL/uL — ABNORMAL LOW (ref 4.22–5.81)
RDW: 20.7 % — ABNORMAL HIGH (ref 11.5–15.5)
WBC: 20.8 10*3/uL — ABNORMAL HIGH (ref 4.0–10.5)

## 2017-05-18 LAB — BRAIN NATRIURETIC PEPTIDE: B Natriuretic Peptide: 1200.5 pg/mL — ABNORMAL HIGH (ref 0.0–100.0)

## 2017-05-18 LAB — MAGNESIUM: MAGNESIUM: 2.1 mg/dL (ref 1.7–2.4)

## 2017-05-18 MED ORDER — LEVOFLOXACIN IN D5W 500 MG/100ML IV SOLN: 500.0000 mg | INTRAVENOUS | Status: DC

## 2017-05-18 MED ORDER — LEVOFLOXACIN IN D5W 750 MG/150ML IV SOLN
750.0000 mg | Freq: Once | INTRAVENOUS | Status: DC
  Filled 2017-05-18: qty 150

## 2017-05-18 MED ORDER — OXYCODONE HCL 5 MG PO TABS
10.0000 mg | ORAL_TABLET | Freq: Four times a day (QID) | ORAL | Status: DC | PRN
  Administered 2017-05-18 – 2017-05-27 (×27): 10 mg via ORAL
  Filled 2017-05-18 (×27): qty 2

## 2017-05-18 MED ORDER — DARBEPOETIN ALFA 200 MCG/0.4ML IJ SOSY
PREFILLED_SYRINGE | INTRAMUSCULAR | Status: AC
  Administered 2017-05-18: 200 ug via INTRAVENOUS
  Filled 2017-05-18: qty 0.4

## 2017-05-18 MED ORDER — HEPARIN SODIUM (PORCINE) 1000 UNIT/ML DIALYSIS: 20.0000 [IU]/kg | INTRAMUSCULAR | Status: DC | PRN

## 2017-05-18 MED ORDER — POTASSIUM CHLORIDE CRYS ER 20 MEQ PO TBCR
20.0000 meq | EXTENDED_RELEASE_TABLET | Freq: Every day | ORAL | Status: DC
  Administered 2017-05-18 – 2017-05-23 (×6): 20 meq via ORAL
  Filled 2017-05-18 (×6): qty 1

## 2017-05-18 NOTE — Progress Notes (Addendum)
LVAD Coordinator Rounding Note:  Admitted12/26due to decompensated heart failure.  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  Vital signs: Tmax: 97.9 HR:86 Doppler Pressure: 96 Automatic cuff:106/90 (98) O2 Sat:100% on3LNC Wt:165>164>156>160>160>154>158>156>157>160>164>167>169>167>168>167>158>157>lbs  LVAD interrogation reveals:  IOEVO:3500 Flow: 5.2 Power:4w PI:3.1 Alarms:none Events:none Hematocrit:27 Fixed speed:5400 Low speed limit:5100  Labs:  LDH trend:205>407>576>923>967>612>528>492>467>489>441>434>405>376>355>315>265>263>281>293>224>231>259  INRtrend:1.34>1.52>1.73>1.95>2.32>3.03>3.80>3.16>2.39>2.23>1.70>1.65>1.91>1.60>1.43>1.49>1.63>1.99>1.97>2.52>3.72>4.60>4.76>3.81>3.33  Anticoagulation Plan: -INR Goal:2-2.5 -ASA Dose: 81 mg   Blood Products: Intra op:2 units/FFP 04/20/17  Post XF:GHWE'X 1 unit 12/29 1 unit 12/30 1 unit 04/25/17 1 unit 05/02/17 1 unit 05/08/17  Device:Medtronic BiV -Therapies:on  Arrhythmia: 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 (on amiodarone and Eliquis for this) - Afib 12/29 - 04/28/17 - NSR 1/5 - present  Nitric Oxide: - stopped on 12/30 due to initiation of BiPap  Respiratory: - Extubated 12/28 - Reintubated 04/23/2017 - Extubated 04/26/2017  Renal:  04/23/17 - CRRT started for AKI - stopped 05/05/17 -Intermittent HD Monday, Wednesday, Friday  Infection: - 04/27/17 + 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 2.Please call VAD Pager for any equipment concerns or drive line dressing questions/concerns. 3. VAD  education to be completed at [facility] February 5th.   [name] RN, VAD Coordinator 24/7 pager [phone]

## 2017-05-18 NOTE — Progress Notes (Signed)
[name]    Assessment/ Plan:    40C with systolic CHF s/p LVAD 14/48  with AKI  On CKD--> ESRD, new-start HD.    1.  AKI on CKD III--> ESRD: likely due to ATN from shock s/p LVAD placement with underlying arterionephrosclerosis.  Required CRRT initially and then transitioned to IHD (1/11).  S/p TDC with IR 05/08/17.  UOP good (2800 yest) but crt inc from 1.7 to 4.19 off of HD- no real signs of GFR recovery yet, last HD 1/21- s/p vein mapping 05/12/17, was considering  perm access placement but I have discussed with cards and the plan is to table that for right now. He wants to table b/c he feels renal function will recover, however I do not know if would be worth putting him through that  because I dont know this pts long term outlook.  Next HD planned for 1/23 but got bumped to this AM due to hgih pt volume- will plan to do next on Sat, then Monday- do not want to do 2 days in [name] row.  Working on [name] to OP unit- lives in [place]- need to make sure they can take LVAD pt  2.  Acute on chronic systolic CHF: s/p Heartmate III 12/27.  Advanced HF and cardiothoracic surgery following.    Coumadin has been restarted (1/14).  On torsemide 60 BID to augment UOP- agree, this will help keep fluid gains down in between HD and prevent such large UF goals which could induce hypotension.  3.  Elevated LFTs/ Hepatic dysfunction- labile INRs,  Per primary  4.  Hyponatremia: vol related,  Improving.   5.  Achromobacter pneumonia: s/p antibiotics (ended 1/13, 10 days)  6.  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 Hypokalemia- on high K bath- running on 4 K today- may need repletion as well - will start 20 daily   8. Anemia- iron stores low, giving iron - on darbe 200 q week  9. Check PTH, phos OK no binder - actually low  Subjective:    No new complaints   Objective:   BP 103/65   Pulse 63   Temp 98.3 F (36.8 C) (Oral)   Resp (!) 22   Ht 5\' 5"  (1.651 m)   Wt 69 kg (152  lb 1.9 oz)   SpO2 98%   BMI 25.31 kg/m   Intake/Output Summary (Last 24 hours) at 05/18/2017 0724 Last data filed at 05/18/2017 0300 Gross per 24 hour  Intake 1194 ml  Output 2875 ml  Net -1681 ml   Weight change: -1.4 kg (-1.4 oz)  Physical Exam: [name] gentleman, NAD, lying in bed HEENT: some JVD upright CVS: mechanical hum of LVAD Resp: normal WOB today, clear anteriorly, some muffled bilateral bses Abd: + drive line LUQ, dressed Ext: no edema ACCESS: R IJ TDC In place NEURO:  AAO x 3 and able to appropriately answer questions  Imaging: No results found.  Labs: BMET Recent Labs  Lab 05/15/17 0511 05/15/17 1739 05/16/17 0336 05/16/17 2010 05/17/17 0503 05/17/17 1522 05/18/17 0318  NA 135 135 135 133* 134* 138 136  K 2.8* 3.3* 3.3* 3.3* 3.2* 3.5 2.9*  CL 94* 98* 99* 97* 94* 96* 91*  CO2 30 27 27 27 28  32 31  GLUCOSE 89 84 93 104* 89 124* 75  BUN 10 <5* <5* 9 11 14 18   CREATININE 3.78* 1.73* 2.62* 3.49* 3.66* 4.19* 3.96*  CALCIUM 8.8* 7.7* 8.0* 8.2* 8.6* 8.6*  8.7*  PHOS 2.6 1.2* 1.9* 2.7 2.7 2.7 3.2   CBC Recent Labs  Lab 05/15/17 0511 05/16/17 0336 05/17/17 0503 05/18/17 0318  WBC 14.7* 17.1* 17.6* 20.8*  HGB 8.5* 7.8* 8.0* 8.2*  HCT 27.7* 25.8* 26.9* 27.0*  MCV 86.8 86.6 87.6 86.3  PLT 379 347 365 364    Medications:    . amoxicillin-clavulanate  1 tablet Oral Daily  . aspirin EC  81 mg Oral Daily  . atorvastatin  40 mg Oral q1800  . chlorhexidine  15 mL Mouth Rinse BID  . citalopram  10 mg Oral Daily  . Darbepoetin Alfa      . darbepoetin (ARANESP) injection - DIALYSIS  200 mcg Intravenous Q Thu-HD  . feeding supplement  1 Container Oral BID BM  . feeding supplement (PRO-STAT SUGAR FREE 64)  30 mL Oral QHS  . ferrous [id number] C-folic acid  1 capsule Oral BID BM  . gabapentin  200 mg Oral BID  . mouth rinse  15 mL Mouth Rinse q12n4p  . pantoprazole  40 mg Oral Daily  . polyethylene glycol  17 g Oral Daily  . torsemide  60 mg  Oral BID  . Warfarin - Pharmacist Dosing Inpatient   Does not apply q1800      David [name]  05/18/2017, 7:24 AM

## 2017-05-18 NOTE — Progress Notes (Signed)
VAD Discharge Teaching Note:  Discharge VAD teaching completed with Glean Salvo and his caregiver support team:             Howie Rufus (wife)  The home inspection checklist has been reviewed and no unsafe conditions have been identified. Specifically the family again reports that there are at least two dedicated grounded, 3-prong outlets with clearly labeled circuit breaker has been established in the bedroom for Charity fundraiser and MPU, running water is in the home as well as a reliable telephone.   A daily flow sheet with patient  weight, temperature,  flow, speed, power, and PI, along with daily self checks on system controller have been performed by patient and caregiver(s) during hospitalization and will also be done daily at home.   Both patient and caregivers have been trained on the following:   1. HeartMate 3 LVAD overview of system operations  2. Overview of major lifestyle accommodations and cautions   3. Overview of system components (features and functions) 4. Changing power sources 5. Overview of alerts and alarms 6. How to identify and manage an emergency including when pump is running and when pump has stopped      7. Changing system controller 8. Maintain emergency contact list and medications  The patient and patent's caregivers have successfully demonstrated:   1. Changing power source (from batteries to MPU/PM, MPU/PM to batteries replacing batteries) 2. Perform system controller self test  3. Check and charge batteries  4. Identify routine maintanence for MPU/UBC/batteries.  5. Change system controller. 6. Paged VAD pager and programmed number in phones.  Exit Site Care:  The caregiver(s) has been trained on percutaneous lead exit site care and dressing changes and have performed sterile dressing changes during patient's hospitalization under nursing supervision. The importance of lead immobilization has been stressed to patient and caregiver(s) using  the attachment device. The caregiver has successfully demonstrated the following: 1. Accurate sterile technique 2. Cleaning of the site per hospital protocol 2. Applying the dressing correctly  3. Immobilizing drive line appropriately   The following routine activities and maintenance have been reviewed with patient and caregiver(s) and both verbalize understanding:  1. Stressed importance of never disconnecting power from both controller power leads at the same time, and never disconnecting both batteries at the same time.  2. Plug the Mobile Power Unit (MPU) and the universal Charity fundraiser (UBC) into properly grounded (3 prong) outlets dedicated to MPU/UBC use. Do NOT use adapter (cheater plug) for ungrounded outlets or multiple portable socket outlets (power strips). 3. Do not connect the MPU or UBC to an outlet controlled by wall switch or the device may not work 4. The MPU AA batteries that provide power to the speaker and indicator lights should be changed every 6 months.  6. Transfer from MPU to batteries during [facility] power failure. The system controller will provide 85mof power to run the pump at set speed.  7. Keep a backup system controller, charged batteries, battery clips, and flashlight near you during sleep in case of electrical power outage 8. Clean battery, battery clip, and universal battery charger contacts weekly 9. Visually inspect percutaneous lead daily - report any tearing, splitting or damage.  10. Check cables and connectors when changing power source  11. Rotate batteries weekly; keep all eight batteries charged. Specific emphasis to remember the emergency back up batteries.  12. Always have backup system controller, battery clips, fully charged batteries, and spare fully charged batteries when traveling. Always  notify your VAD Team of travel >3 hours distance away.  13. Re-calibrate batteries every 70 uses; recharge back up system controller every 6 months, monitor  battery life of 36 months or 360 uses; replace batteries at end of battery life   Identified the following changes in activities of daily living with pump:  1. No driving for at least six weeks and then only if doctor gives permission to do so 2. No tub baths while pump implanted, and shower only if doctor gives permission with the manufacturer shower equipment.  3. No swimming or submersion in water while implanted with pump 4. Keep all VAD equipment away from water or moisture 5. Keep all VAD connections clean and dry 6. No contact sports or engage in jumping activities 7. Avoid strong static electricity (touching TV/computer screens, vacuuming) 8. Never have an MRI while implanted with the pump 9. Never leave or store batteries in extremely hot or cold places (such as   trunk of your car), or the battery life will be shortened 10. Call the doctor or hospital contact person if any change in how the pump sounds, feels, or works 11. Plan to sleep only when connected to the MPU. 12. Keep a backup system controller, charged batteries, battery clips, and flashlight near you during sleep in case of electrical power outage 13. Do not sleep on your stomach and avoid laying on driveline exit site, especially while it is healing.  14. Talk with doctor/coordinator before any long distance travel plans 15. Patient will need antibiotics prior to any dental procedure; instructed to contact VAD coordinator before any dental procedures (including routine cleaning) 16. Paitent will maintain appointments with PCP.    Discharge binder given to patient and include the following:  1. Discharge instructions - [place] VAD Patient Education Binder 2. List of emergency contacts 3. Wallet card 4. HM 3 VAD Luggage tags 5. Guide to Percutaneous Lead Care and Equipment Maintenance 6. HM 3 Alarms for Patients and their Caregivers 7. HM 3 Patient Handbook 8. HM 3 Information and Emergency Assistance  Guide 9. HM 3 MPU Alarms, Care & Maintenance 11. Daily diary sheets 12. Care of the Percutaneous Lead  Discharge equipment includes:  1. Two system controllers 2. One MPU with 54' patient cable 3. One universal Charity fundraiser (UBC) 4. Eight fully charged 14V Li-Ion batteries      5. Four battery clips 6. One travel case 7. Medium Holster Vest 8. Wearable accessory kit 9. Shower bags (2) 10. 20 dressings and 5 anchors  Warfarin / ASA Education & Management:  Discussed frequency and importance of INR checks and taking coumadin/ASA as prescribed by the doctor; emphasized importance of maintaining INR goal to prevent clotting and/or bleeding issues with pump. He has never been on warfarin in the past and will meet more with coordinators and pharm-D on outpatient basis. He has received inpatient coumadin education and has been educated on diet and importance to inform us about medication changes made outside VAD clinic. Reinforced that he should ONLY take instructions regarding his coumadin from our VAD team and we need to be involved in any elective procedures that require warfarin hold.   Able to answer questions and asked good questions pertaining to warfarin and diet/lifestyle changes necessary to be successful and safe.  Patient will have INR managed with the assistance of Home Health RN ([place]); current INR goal is 2.0 - 2.5.    Controller Change Outs: 1. Patient should have a  caregiver present during system controller exchange or call 911 and VAD pager before attempting to change controller if alone. 2. As part of the weekly safety checklist, the patient and caregiver should review and understand the steps and instructions involved with replacing the system controller.  3. As part of monthly safety checklist, the patient and caregiver should review and understand the system controller alarms and how to resolve them.  4. Updated copy of HM 3 Guide to Replacing the  [place] with the [place] for Patients and Their Caregiver(s) was given and reviewed.  5. HM 3 Home Flowsheets given that include the weekly and monthly checks as noted above.  Post-Education Competency Test: The patient has completed a proficiency test for the HM 3 and all questions have been answered. The pt and family have been instructed to call if any questions, problems, or concerns arise. Pt and caregiver successfully paged VAD coordinator using VAD pager emergency number and have been instructed to use this number only for emergencies. Caregiver(s) asked appropriate questions, had good interaction with VAD coordinator, and verbalized understanding of above instructions. Patient was sleepy due to pain medication needs at times during education. Follow up will be made Monday to validate evidence of learning.    [name] RN, VAD Coordinator 24/7 pager [phone]

## 2017-05-18 NOTE — Progress Notes (Signed)
PT Cancellation Note  Patient Details Name: David [name] MRN: [id number] DOB: Aug 23, 1965   Cancelled Treatment:    Reason Eval/Treat Not Completed: (P) Other (comment);Patient at procedure or test/unavailable(Pt currently undergoing dialysis tx, will f/u as time permits per POC.   )   Emilene Roma Eli Hose 05/18/2017, 10:26 AM  [name], PTA pager [phone]

## 2017-05-18 NOTE — Care Management Note (Signed)
Case Management Note Previous Note Created by [name]  Patient Details  Name: David [name] MRN: [id number] Date of Birth: Sep 19, 1965  Subjective/Objective:  S/p LVAD implant 12/27, conts on dobutamine, co - x 81%, sob this am, urine output is less than 100cc in 24 hrs, CRRT was stopped on 1/10, transitioning to IHD, to place picc. He was already active with [facility] , will resume for PT and OT , but will need to see if still needs rollator at dc.     1/14 [name] RN, BSN- tunneled cathter placed today, bleeding around VAD drive site, hepain on hold, he had his first dialysis on 1/12,will need IHD, has minimal urine output, conts on dobutamine.  Remains hopeful to regain renal function but looking slimmer per MD notes.  1/16 [name] RN, BSN - POD 21 LVAD implant, conts with IHD, dobutamie off, hgb 7.8, conts on heaprin , lasix iv x 1 .  Discussed in LOS.  Patient is active with [facility] , he would like to stay with them when he goes home with [facility] for PT/OT, will need resume orders at that time.                     Action/Plan: PTA Pt lived at home with spouse- was active with [facility] for home Dobutamine-HH services- PCP- Gilford Rile-  CM will follow post VAD implant for transition needs- anticipate return home with wife.      Expected Discharge Date:                  Expected Discharge Plan:  [place]  In-House Referral:     Discharge planning Services  CM Consult  Post Acute Care Choice:  Home Health, Resumption of Svcs/PTA Provider Choice offered to:  Patient  DME Arranged:    DME Agency:     HH Arranged:  RN, PT, OT [place] Agency:  [place]  Status of Service:  In process, will continue to follow  If discussed at Long Length of Stay Meetings, dates discussed:    Additional Comments: 05/18/2017  Vein mapping done plan for AV graft placement if no recovery. Clipping process initated  05/17/17 CM ordered rollator from [facility].  [facility] aware  that orders are in for [facility]   Pt is off all drips Maryclare Labrador, RN 05/18/2017, 8:59 AM

## 2017-05-18 NOTE — Progress Notes (Signed)
CARDIAC REHAB PHASE I   PRE:  Rate/Rhythm: 85 SR    BP: sitting 86    SaO2: would not register, 2L  MODE:  Ambulation: 425 ft   POST:  Rate/Rhythm: 85 SR    BP: lying 90     SaO2: would not register  Pt feeling significant fatigue and sleepiness today. Willing to walk. We discussed weaning off O2 and pt was agreeable however I could not get his SAO2 on finger, ear, or forehead before walking. Pt decided he wanted to walk without O2. Steady but tired. Had to rest x1 standing after 300 ft. Sts he is SOB and tired. Looks more tired than normal. Finished walk and returned to bed. He was not able to sit up long while I checked BP. Laid back down and MAP 90. ? If he was low during walk. RN and I attempted to get SaO2 in two fingers, with hot pack, then toe. Nothing was successful. Left pt with RN, pt asleep in bed. He had declined the recliner.   He did better today switching to batteries before walking. No errors. Able to answer questions appropriately. I switched him back to power after walk in order to lie down quickly since he was so tired.  David [name], ACSM 05/18/2017 2:42 PM

## 2017-05-18 NOTE — Progress Notes (Signed)
Darrick Grinder, NP called VAD coordinator to report patient had his drive line pulled on yesterday while getting up with PT.   VAD dressing removed and site care performed using sterile technique. Drive line exit site cleaned with Chlora prep applicators x 2, allowed to dry, and gauze dressing with aquacel silver strip re-applied. Exit site with partial tissue ingrowth, the velour is fully implanted at exit site. Erythema around exit site remains the same, no tenderness, drainage, foul odor, or rash noted. Drive line anchor in place and accurately applied.      Pt doesn't remember incident from yesterday. Stressed importance of maintaining awareness of controller and drive line with any and all activities. Pt verbalized understanding of same.

## 2017-05-18 NOTE — Progress Notes (Signed)
Patient ID: David [name], male   DOB: 1966-01-31, 52 y.o.   MRN: [id number]   Advanced Heart Failure VAD Team Note  Subjective:    David [name] is a 52 y.o. male with a PMH of ICM ('09 PCI to LAD, mRCA, '10 PCI to Lcx and '12 mRCA), HFrEF (EF ~20% for >5 years), HLD and HTN admitted 12/26 for VAD placement due to end-stage systolic HF.   - Underwent successful HM-3 LVAD placement 04/20/17. 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 CVVHD started 12/30 - Started on Meropenem 04/27/16 with + Achromobacter in tracheal aspirate. (Finished 05/07/17) - CVVHD stopped 1/10 - 1st iHD 1/12 - Tunneled HD cath placed 05/08/17 - Keflex for mild drainage from Driveline 9/47/65 switched augmentin . Completed augmentin 1/23  Yesterday had 2.8 liters urine output. WBC trending up 20.7. Creatinine actually trending down without HD. Denies SOB. Appetite improving. Still requesting pain meds q4 for chest soreness  Echo 12/31 reviewed: No significant pericardial effusion, IV septum midline to slightly to the right, RV function adequate.  LVAD INTERROGATION:  HeartMate III LVAD:  Flow 5.2   liters/min, speed 5400, power 4, PI 3.1  .   Objective:    Vital Signs:   Temp:  [98 F (36.7 C)-98.4 F (36.9 C)] 98.3 F (36.8 C) (01/24 0712) Pulse Rate:  [63-103] 77 (01/24 0830) Resp:  [16-24] 22 (01/24 0700) BP: (73-107)/(36-82) 90/65 (01/24 0830) SpO2:  [92 %-99 %] 92 % (01/24 0712) Weight:  [151 lb 14.4 oz (68.9 kg)-152 lb 1.9 oz (69 kg)] 152 lb 1.9 oz (69 kg) (01/24 0700) Last BM Date: 05/14/17 Mean arterial Pressure 70-80s  Intake/Output:   Intake/Output Summary (Last 24 hours) at 05/18/2017 0843 Last data filed at 05/18/2017 0747 Gross per 24 hour  Intake 1314 ml  Output 2875 ml  Net -1561 ml   Physical Exam  Maps 80-90s   Physical Exam: GENERAL: Currently receiving HD in the room.  HEENT: normal  NECK: Supple, JVP ~10.  2+ bilaterally, no bruits.  No lymphadenopathy or thyromegaly  appreciated.   CARDIAC:  RRR LVAD hum present.  R upper chest HD catheter.  LUNGS:  Clear to auscultation bilaterally.  ABDOMEN:  Soft, round, nontender, positive bowel sounds x4.     LVAD exit site:  Dressing dry and intact.  No erythema or drainage.  Stabilization device present and accurately applied.  Driveline dressing is being changed daily per sterile technique. Still not incorporated well  EXTREMITIES:  Warm and dry, no cyanosis, clubbing, rash or edema  NEUROLOGIC:  Alert and oriented x 4.  Gait steady.  No aphasia.  No dysarthria.  Affect pleasant.      Telemetry   NSR 80-90s personally reviewed.    Labs   Basic Metabolic Panel: Recent Labs  Lab 05/14/17 0646  05/15/17 0511  05/16/17 0336 05/16/17 2010 05/17/17 0503 05/17/17 1522 05/18/17 0318  NA 135   < > 135   < > 135 133* 134* 138 136  K 3.4*   < > 2.8*   < > 3.3* 3.3* 3.2* 3.5 2.9*  CL 97*   < > 94*   < > 99* 97* 94* 96* 91*  CO2 28   < > 30   < > 27 27 28  32 31  GLUCOSE 87   < > 89   < > 93 104* 89 124* 75  BUN 5*   < > 10   < > <5* 9 11 14  18  CREATININE 2.93*   < > 3.78*   < > 2.62* 3.49* 3.66* 4.19* 3.96*  CALCIUM 8.6*   < > 8.8*   < > 8.0* 8.2* 8.6* 8.6* 8.7*  MG 1.6*  --  1.5*  --  1.6*  --  1.4*  --  2.1  PHOS 2.7   < > 2.6   < > 1.9* 2.7 2.7 2.7 3.2   < > = values in this interval not displayed.    Liver Function Tests: Recent Labs  Lab 05/16/17 0336 05/16/17 2010 05/17/17 0503 05/17/17 1522 05/18/17 0318  ALBUMIN 1.5* 1.6* 1.6* 1.6* 1.6*   No results for input(s): LIPASE, AMYLASE in the last 168 hours. No results for input(s): AMMONIA in the last 168 hours.  CBC: Recent Labs  Lab 05/14/17 0646 05/15/17 0511 05/16/17 0336 05/17/17 0503 05/18/17 0318  WBC 14.1* 14.7* 17.1* 17.6* 20.8*  HGB 7.9* 8.5* 7.8* 8.0* 8.2*  HCT 26.2* 27.7* 25.8* 26.9* 27.0*  MCV 86.2 86.8 86.6 87.6 86.3  PLT 322 379 347 365 364    INR: Recent Labs  Lab 05/14/17 0646 05/15/17 0511 05/16/17 0336  05/17/17 0503 05/18/17 0318  INR 3.72 4.60* 4.76* 3.81 3.33    Other results:  Imaging   No results found.   Medications:     Scheduled Medications: . amoxicillin-clavulanate  1 tablet Oral Daily  . aspirin EC  81 mg Oral Daily  . atorvastatin  40 mg Oral q1800  . chlorhexidine  15 mL Mouth Rinse BID  . citalopram  10 mg Oral Daily  . Darbepoetin Alfa      . darbepoetin (ARANESP) injection - DIALYSIS  200 mcg Intravenous Q Thu-HD  . feeding supplement  1 Container Oral BID BM  . feeding supplement (PRO-STAT SUGAR FREE 64)  30 mL Oral QHS  . ferrous [id number] C-folic acid  1 capsule Oral BID BM  . gabapentin  200 mg Oral BID  . mouth rinse  15 mL Mouth Rinse q12n4p  . pantoprazole  40 mg Oral Daily  . polyethylene glycol  17 g Oral Daily  . potassium chloride  20 mEq Oral Daily  . torsemide  60 mg Oral BID  . Warfarin - Pharmacist Dosing Inpatient   Does not apply q1800    Infusions: . sodium chloride Stopped (04/30/17 0800)  . ferric gluconate (FERRLECIT/NULECIT) IV Stopped (05/13/17 1802)    PRN Medications: heparin, levalbuterol, ondansetron (ZOFRAN) IV, oxyCODONE, simethicone, sodium chloride flush, sodium chloride flush   Patient Profile   David [name] is a 52 y.o. male with a PMH of ICM ('09 PCI to LAD, mRCA, '10 PCI to Lcx and '12 mRCA), HFrEF (EF ~20% for >5 years) s/p HM-III VAD placement on 12/27 due to end-stage systolic HF.   Assessment/Plan:    1. Acute on chronic systolic HF: Echo 31/51/76 with LVEF 20-25%, Mild MR, Mild/Mod MR, Severe LAE, Mildly reduced RV, PA peak pressure 56 mm Hg.  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 Extubated, initially stopped epinephrine/norepinephrine but restarted with fall in UOP and MAP.  With progressive renal failure, pulmonary edema, and hypotension, he was re-intubated on 12/30.   CVVH was begun.  12/31 was difficult day with refractory shock (MAP in 60s).  Reviewed  12/31 echo which showed no significant pericardial effusion, adequate RV function, and IV septum near mid-line.  Off all pressors.  - Continue aspirin 81 mg daily.  - Warfarin goal INR 2-2.5. INR 3.3   -  Coumadin has been on hold.  2. CAD s/p PCI to mid RCA and PLOM with DES x 2 and DES to mid LAD in 1/18.   - Continue ASA and statin.  -no S/S ischemia.  3. PAF: - Remains in NSR. 4. ARF on CKD stage III: Suspect some degree of intrinsic renal disease prior to LVAD placement (baseline creatinine around 1.5) with probable ATN from intra-op/peri-op hypotension and development of vasodilatory/septic shock.  CVVHD stopped 05/04/2017. First iHD 05/06/17 without problems.Tunneled HD cath placed 05/08/17.  For HD tomorrow.  -  Vein mapping done plan for AV graft placement if no recovery.  - INR 3.33  5. Anemia: s/p 1 u PRBCs 12/30, 04/25/17, 1/8.  -Hgb 8.2  Continue aranesp .  - Want to limit transfusion with possibility of transplant down the road. Follow closely.  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 ID: Started on cefepime empirically with septic/vasodilatory shock, PCT was 31 04/25/17.  ABX broadened to Meropenem 04/27/16 with + Achromobacter on tracheal aspirate.  - Finished 05/07/17 (10 days)  PCT 8.31 04/27/17 -> 3.87 ->0.94 - Afebrile.  -Completed augmentin course.   - WBC trending up 14.7>1>17.6 >20.8 ? HD catheter may be a possible source.  -Drive Line has been ok.  - Continue IS  7. Elevated LFTs due to shock liver - LFTs improved but albumin low. Suspect compromised liver synthetic function. Encouraged protein intake. 8. Hyponatremia Sodium 136.  9. Driveline bleeding - Resolved after stitch placed by Dr. Cyndia Bent.  10. Constipation - limit narcotics 11. Pain - reduce oxycodone - Continue gabapentin 200 bid 12. Protein-calorie malnutrition - Nutrition appreciated.  - encouraged protein intake - Prealbumin 12.3.     Length of Stay: Little Flock, NP 05/18/2017, 8:43  AM  VAD Team --- VAD ISSUES ONLY--- Pager [phone] (7am - 7am) Advanced Heart Failure Team  Pager [phone] (M-F; 7a - 4p)  Please contact [facility] for night-coverage after hours (4p -7a ) and weekends on [email or web address]  Patient seen and examined with Darrick Grinder, NP. We discussed all aspects of the encounter. I agree with the assessment and plan as stated above.  Progressing slowly. Yesterday with almost 3L out. Creatinine high but actually slightly better. Still with CP. Will cut narcotics back to q6h. WBC climbing. No fevers. Encouraged to use IS. Will treat with levaquin for 5 days.  INR 3.33. No bleeding. Discussed dosing with PharmD personally.  Continue mobilization.   I d/w Renal. Seems to be getting some GFR recovery. Will let him ride off HD for a few days and see what happens. Supp K today.  VAD interrogated personally. Parameters stable.  Glori Bickers, MD  10:00 AM

## 2017-05-18 NOTE — Progress Notes (Signed)
Patient ID: David [name], male   DOB: 1965-08-08, 52 y.o.   MRN: [id number] HeartMate 3 Rounding Note  Subjective:    Had HD this am. Now he is very tired and lethargic.  Sore. Upset that pain meds have been decreased to every 6 hrs but reportedly has been lethargic.  Eating. Passing a lot of urine. Creat actually down slightly this am compared to yesterday without HD.  LVAD INTERROGATION:  HeartMate IIl LVAD:  Flow 5 liters/min, speed 5400, power 4, PI 3   Objective:    Vital Signs:   Temp:  [97.6 F (36.4 C)-98.4 F (36.9 C)] 97.6 F (36.4 C) (01/24 1105) Pulse Rate:  [51-90] 75 (01/24 1105) Resp:  [14-24] 14 (01/24 1145) BP: (73-121)/(30-90) 95/45 (01/24 1145) SpO2:  [92 %-99 %] 92 % (01/24 0712) Weight:  [68.3 kg (150 lb 9.2 oz)-69 kg (152 lb 1.9 oz)] 68.3 kg (150 lb 9.2 oz) (01/24 1105) Last BM Date: 05/14/17 Mean arterial Pressure 80's most of the time but 60 now after HD.  Intake/Output:   Intake/Output Summary (Last 24 hours) at 05/18/2017 1225 Last data filed at 05/18/2017 1105 Gross per 24 hour  Intake 1074 ml  Output 2855 ml  Net -1781 ml     Physical Exam: General:  Chronically ill-appearing, sleepy   No resp difficulty Cor: intermittent heart sounds with LVAD hum present. Lungs: clear Abdomen: soft, nontender, nondistended. Good bowel sounds. Extremities: minimal bilateral LE edema. TED stockings on Neuro: alert & orientedx3,  moves all 4 extremities w/o difficulty. Affect pleasant The sternotomy and abdominal incision are healing well. Driveline dressing to be done by VAD coordinator.  Telemetry: sinus rhythm 80's  Labs: Basic Metabolic Panel: Recent Labs  Lab 05/14/17 0646  05/15/17 0511  05/16/17 0336 05/16/17 2010 05/17/17 0503 05/17/17 1522 05/18/17 0318  NA 135   < > 135   < > 135 133* 134* 138 136  K 3.4*   < > 2.8*   < > 3.3* 3.3* 3.2* 3.5 2.9*  CL 97*   < > 94*   < > 99* 97* 94* 96* 91*  CO2 28   < > 30   < > 27 27 28  32 31  GLUCOSE  87   < > 89   < > 93 104* 89 124* 75  BUN 5*   < > 10   < > <5* 9 11 14 18   CREATININE 2.93*   < > 3.78*   < > 2.62* 3.49* 3.66* 4.19* 3.96*  CALCIUM 8.6*   < > 8.8*   < > 8.0* 8.2* 8.6* 8.6* 8.7*  MG 1.6*  --  1.5*  --  1.6*  --  1.4*  --  2.1  PHOS 2.7   < > 2.6   < > 1.9* 2.7 2.7 2.7 3.2   < > = values in this interval not displayed.    Liver Function Tests: Recent Labs  Lab 05/16/17 0336 05/16/17 2010 05/17/17 0503 05/17/17 1522 05/18/17 0318  ALBUMIN 1.5* 1.6* 1.6* 1.6* 1.6*   No results for input(s): LIPASE, AMYLASE in the last 168 hours. No results for input(s): AMMONIA in the last 168 hours.  CBC: Recent Labs  Lab 05/14/17 0646 05/15/17 0511 05/16/17 0336 05/17/17 0503 05/18/17 0318  WBC 14.1* 14.7* 17.1* 17.6* 20.8*  HGB 7.9* 8.5* 7.8* 8.0* 8.2*  HCT 26.2* 27.7* 25.8* 26.9* 27.0*  MCV 86.2 86.8 86.6 87.6 86.3  PLT 322 379 347 365 364  INR: Recent Labs  Lab 05/14/17 0646 05/15/17 0511 05/16/17 0336 05/17/17 0503 05/18/17 0318  INR 3.72 4.60* 4.76* 3.81 3.33    Other results:  EKG:   Imaging: No results found.   Medications:     Scheduled Medications: . aspirin EC  81 mg Oral Daily  . atorvastatin  40 mg Oral q1800  . chlorhexidine  15 mL Mouth Rinse BID  . citalopram  10 mg Oral Daily  . darbepoetin (ARANESP) injection - DIALYSIS  200 mcg Intravenous Q Thu-HD  . feeding supplement  1 Container Oral BID BM  . ferrous [id number] C-folic acid  1 capsule Oral BID BM  . gabapentin  200 mg Oral BID  . mouth rinse  15 mL Mouth Rinse q12n4p  . pantoprazole  40 mg Oral Daily  . polyethylene glycol  17 g Oral Daily  . potassium chloride  20 mEq Oral Daily  . torsemide  60 mg Oral BID  . Warfarin - Pharmacist Dosing Inpatient   Does not apply q1800    Infusions: . sodium chloride Stopped (04/30/17 0800)  . ferric gluconate (FERRLECIT/NULECIT) IV Stopped (05/18/17 1106)  . [START ON 05/20/2017] levofloxacin (LEVAQUIN) IV    .  levofloxacin (LEVAQUIN) IV Stopped (05/18/17 0940)    PRN Medications: heparin, levalbuterol, ondansetron (ZOFRAN) IV, oxyCODONE, simethicone, sodium chloride flush, sodium chloride flush   Assessment:   1. Ischemic cardiomyopathy s/p multiple PCI's with chronic systolic heart failure and EF 20% on home dobutamine preop with significant improvement in symptoms. Evaluated at [facility] for heart transplant but LVAD felt to be the best treatment for him at this time. POD23s/p HeartMate III LVAD. He was critically ill with multi-system organ failure but has continued to recover and only issue now is renal failure requiring HD. Hemodynamics and VAD parameters very stable 2.H/OHypertension 3.H/OHyperlipidemia 4. PAF in sinuspreopon amiodarone and Eliquis.Now back in sinus off amiodarone.  5.H/OStage III CKD. Acute on chronic postop renal failureon intermittent HD. Making good urine with Demedex and creat actually down from 4.19 yesterday am to 3.96 this am. This could certainly be a lab error. His volume status is good and negative every day so I don't really see a need for HD unless BUN/creat/K+ rising. HD is just going to drop BP and make it more likely that kidneys will not recover.  6. LBBB s/p CRT-D upgrade in 10/2016 without improvement in symptoms or EF. LV epicardial leads cut off at the time of surgery. 7. Prior smoking with mild obstruction and severe diffusion defect on PFT's. Continue IS. 8. Postop leukocytosis and fever. WBC normalized but now trending up daily to 20.8 today. He was started on Keflex and finished course of Augmentin. Started on Levaquin now. The only concerning source would be dialysis catheter infection.  9. Marked rise in bilirubin and transaminases. Likely due to pump runand right sided congestion, hypotension.Resolved   10. INR coming down slowly. Coumadin on hold. Coumadin per pharmacy.     11. Severe malnutrition with albumin 1.6. He is eating fairly well  now.  Continue to encourage nutrition.    12. PT    13. Anemia: follow. He is on iron.           Plan/Discussion:    Overall he has been very stable. MAP in good range.  VAD flow has been great. Would hold off on further HD unless he clearly needs it.  Continue exit site dressings.  Levaquin for progressive leukocytosis of unclear etiology. Continue nutrition, ambulation,  IS.   I reviewed the LVAD parameters from today, and compared the results to the patient's prior recorded data.  No programming changes were made.  The LVAD is functioning within specified parameters.   LVAD interrogation was negative for any significant power changes, alarms or PI events/speed drops.  LVAD equipment check completed and is in good working order.  Back-up equipment present.   LVAD education done on emergency procedures and precautions and reviewed exit site care.  Length of Stay: [address]  Fernande Boyden [facility] 05/18/2017, 12:25 PM

## 2017-05-18 NOTE — Procedures (Signed)
Patient was seen on dialysis and the procedure was supervised.  BFR 400  Via PC BP is  103/65.   Patient appears to be tolerating treatment well  David [name] A 05/18/2017

## 2017-05-18 NOTE — Progress Notes (Signed)
David [name] for Coumadin Indication: LVAD  Allergies  Allergen Reactions  . Plavix [Clopidogrel Bisulfate] Hives   Patient Measurements: Height: 5\' 5"  (165.1 cm) Weight: 152 lb 1.9 oz (69 kg) IBW/kg (Calculated) : 61.5  Vital Signs: Temp: 98.3 F (36.8 C) (01/24 0712) Temp Source: Oral (01/24 0712) BP: 88/57 (01/24 0845) Pulse Rate: 51 (01/24 0845)  Labs: Recent Labs    05/16/17 0336  05/17/17 0503 05/17/17 1522 05/18/17 0318  HGB 7.8*  --  8.0*  --  8.2*  HCT 25.8*  --  26.9*  --  27.0*  PLT 347  --  365  --  364  LABPROT 44.3*  --  37.2*  --  33.6*  INR 4.76*  --  3.81  --  3.33  CREATININE 2.62*   < > 3.66* 4.19* 3.96*   < > = values in this interval not displayed.   Estimated Creatinine Clearance: 18.8 mL/min (A) (by C-G formula based on SCr of 3.96 mg/dL (H)).  Assessment: 52 yo male s/p LVAD implantation 12/27.   Heparin drip stopped 1/17  Warfarin held for 3 days INR now down 4.6 > 3.8 > 3.3.  No overt bleeding noted. Hgb low stable 8  LDH 200s has been stable. Will hold warfarin again today - plan for HD graft this week when INR low enough - will need to bridge with heparin when INR < 2 Not eating much - instructed to drink ensure or boost every day   Goal of Therapy:  INR 2-2.5 Monitor platelets by anticoagulation protocol: Yes    Plan: Hold warfarin again tonight Daily PT/INR  [name], BCPS  Clinical Pharmacist Pager [phone]  05/18/2017 9:11 AM

## 2017-05-18 NOTE — Progress Notes (Signed)
PT Cancellation Note  Patient Details Name: Parminder Cupples MRN: [id number] DOB: 1966-01-03   Cancelled Treatment:    Reason Eval/Treat Not Completed: (P) Other (comment);Patient at procedure or test/unavailable(Pt currently working with cardiac rehab, will continue efforts if time permits.  )   Burrell Hodapp Eli Hose 05/18/2017, 2:01 PM  [name], PTA pager [phone]

## 2017-05-18 NOTE — Progress Notes (Addendum)
Nutrition Follow-up  DOCUMENTATION CODES:   Not applicable  INTERVENTION:   -Continue Boost Breeze po BID, each supplement provides 250 kcal and 9 grams of protein -D/c 30 ml Prostat daily, each supplement provides 100 kcals and 15 grams protein  NUTRITION DIAGNOSIS:   Increased nutrient needs related to (LVAD) as evidenced by estimated needs.  Ongoing  GOAL:   Patient will meet greater than or equal to 90% of their needs  Progressing  MONITOR:   PO intake, Supplement acceptance, Labs, Weight trends, I & O's  REASON FOR ASSESSMENT:   Consult Assessment of nutrition requirement/status  ASSESSMENT:   Pt with PMH of HLD, HTN, end-stage CHF admitted for HM-3 LVAD placement 12/27. Extubated 12/28, pt with hypotension and poor UOP CRRT started 12/30. Pt re-intubated 12/30 with worsening acidosis and respiratory distress. Extubated 12/31.   Pt sleeping soundly, receiving HD at time of visit.   Case discussed with RN, who reports improved appetite over the past several days (noted meal completion 75-100%). RN confirms pt is taking supplements, really enjoys [employer], but does not like Prostat. Pt with increased urine output and kidney function per RN.    Noted 6# wt loss over the past 3 days; suspect may be related to fluid changes. Wt has ranged from 157-170# since admission. Noted -1.6 L x 24 hours and -7.4 L since admission.   Labs reviewed: K: 2.9 (on PO supplementation).    Diet Order:  Diet Heart Room service appropriate? Yes; Fluid consistency: Thin  EDUCATION NEEDS:   No education needs have been identified at this time  Skin:  Skin Assessment: Skin Integrity Issues: Skin Integrity Issues:: Incisions Incisions: sternum and abdomen  Last BM:  05/14/17  Height:   Ht Readings from Last 1 Encounters:  05/11/17 5\' 5"  (1.651 m)    Weight:   Wt Readings from Last 1 Encounters:  05/18/17 152 lb 1.9 oz (69 kg)    Ideal Body Weight:  61.8 kg  BMI:  Body  mass index is 25.31 kg/m.  Estimated Nutritional Needs:   Kcal:  2100-2300  Protein:  105-120 grams  Fluid:  >1.5 L/day    Jayven Naill A. Jimmye Norman, RD, LDN, CDE Pager: [phone] After hours Pager: [phone]

## 2017-05-19 DIAGNOSIS — D72829 Elevated white blood cell count, unspecified: Secondary | ICD-10-CM

## 2017-05-19 DIAGNOSIS — Z992 Dependence on renal dialysis: Secondary | ICD-10-CM

## 2017-05-19 DIAGNOSIS — N183 Chronic kidney disease, stage 3 (moderate): Secondary | ICD-10-CM

## 2017-05-19 DIAGNOSIS — Z888 Allergy status to other drugs, medicaments and biological substances status: Secondary | ICD-10-CM

## 2017-05-19 DIAGNOSIS — Z87891 Personal history of nicotine dependence: Secondary | ICD-10-CM

## 2017-05-19 LAB — RENAL FUNCTION PANEL
ALBUMIN: 1.7 g/dL — AB (ref 3.5–5.0)
ANION GAP: 11 (ref 5–15)
ANION GAP: 12 (ref 5–15)
Albumin: 1.7 g/dL — ABNORMAL LOW (ref 3.5–5.0)
BUN: 10 mg/dL (ref 6–20)
BUN: 13 mg/dL (ref 6–20)
CALCIUM: 8.5 mg/dL — AB (ref 8.9–10.3)
CHLORIDE: 96 mmol/L — AB (ref 101–111)
CO2: 28 mmol/L (ref 22–32)
CO2: 28 mmol/L (ref 22–32)
Calcium: 8.4 mg/dL — ABNORMAL LOW (ref 8.9–10.3)
Chloride: 93 mmol/L — ABNORMAL LOW (ref 101–111)
Creatinine, Ser: 2.65 mg/dL — ABNORMAL HIGH (ref 0.61–1.24)
Creatinine, Ser: 3.11 mg/dL — ABNORMAL HIGH (ref 0.61–1.24)
GFR calc non Af Amer: 26 mL/min — ABNORMAL LOW (ref >60)
GFR, EST AFRICAN AMERICAN: 30 mL/min — AB (ref >60)
GFR, EST AFRICAN AMERICAN: 25 mL/min — AB (ref >60)
GFR, EST NON AFRICAN AMERICAN: 21 mL/min — AB (ref >60)
Glucose, Bld: 93 mg/dL (ref 65–99)
Glucose, Bld: 104 mg/dL — ABNORMAL HIGH (ref 65–99)
PHOSPHORUS: 3.5 mg/dL (ref 2.5–4.6)
POTASSIUM: 3.4 mmol/L — AB (ref 3.5–5.1)
POTASSIUM: 3.3 mmol/L — AB (ref 3.5–5.1)
Phosphorus: 2.4 mg/dL — ABNORMAL LOW (ref 2.5–4.6)
SODIUM: 133 mmol/L — AB (ref 135–145)
Sodium: 135 mmol/L (ref 135–145)

## 2017-05-19 LAB — CBC
HEMATOCRIT: 27.1 % — AB (ref 39.0–52.0)
Hemoglobin: 8.7 g/dL — ABNORMAL LOW (ref 13.0–17.0)
MCH: 27.6 pg (ref 26.0–34.0)
MCHC: 32.1 g/dL (ref 30.0–36.0)
MCV: 86 fL (ref 78.0–100.0)
PLATELETS: 345 10*3/uL (ref 150–400)
RBC: 3.15 MIL/uL — AB (ref 4.22–5.81)
RDW: 20.6 % — ABNORMAL HIGH (ref 11.5–15.5)
WBC: 24 10*3/uL — ABNORMAL HIGH (ref 4.0–10.5)

## 2017-05-19 LAB — TYPE AND SCREEN
ABO/RH(D): O POS
Antibody Screen: POSITIVE
DAT, IgG: POSITIVE
Donor AG Type: NEGATIVE
Donor AG Type: NEGATIVE
UNIT DIVISION: 0
Unit division: 0

## 2017-05-19 LAB — BPAM RBC
BLOOD PRODUCT EXPIRATION DATE: 201902082359
Blood Product Expiration Date: 201902012359
UNIT TYPE AND RH: 5100
Unit Type and Rh: 5100

## 2017-05-19 LAB — PTH, INTACT AND CALCIUM
CALCIUM TOTAL (PTH): 8.6 mg/dL — AB (ref 8.7–10.2)
PTH: 46 pg/mL (ref 15–65)

## 2017-05-19 LAB — MAGNESIUM: Magnesium: 1.7 mg/dL (ref 1.7–2.4)

## 2017-05-19 LAB — LACTATE DEHYDROGENASE: LDH: 242 U/L — ABNORMAL HIGH (ref 98–192)

## 2017-05-19 LAB — PROTIME-INR
INR: 2.99
PROTHROMBIN TIME: 30.8 s — AB (ref 11.4–15.2)

## 2017-05-19 MED ORDER — LINEZOLID 600 MG PO TABS
600.0000 mg | ORAL_TABLET | Freq: Two times a day (BID) | ORAL | Status: AC
  Administered 2017-05-19 – 2017-05-23 (×10): 600 mg via ORAL
  Filled 2017-05-19 (×10): qty 1

## 2017-05-19 MED ORDER — CITALOPRAM HYDROBROMIDE 10 MG PO TABS
5.0000 mg | ORAL_TABLET | Freq: Every day | ORAL | Status: DC
  Administered 2017-05-20 – 2017-05-22 (×3): 5 mg via ORAL
  Filled 2017-05-19 (×3): qty 1

## 2017-05-19 MED ORDER — SORBITOL 70 % SOLN
30.0000 mL | Freq: Once | Status: AC
  Administered 2017-05-19: 30 mL via ORAL
  Filled 2017-05-19: qty 30

## 2017-05-19 MED ORDER — MAGNESIUM SULFATE 2 GM/50ML IV SOLN
2.0000 g | Freq: Once | INTRAVENOUS | Status: AC
  Administered 2017-05-19: 2 g via INTRAVENOUS
  Filled 2017-05-19: qty 50

## 2017-05-19 MED ORDER — LACTULOSE 10 GM/15ML PO SOLN
20.0000 g | Freq: Once | ORAL | Status: AC
  Administered 2017-05-19: 20 g via ORAL
  Filled 2017-05-19: qty 30

## 2017-05-19 NOTE — Progress Notes (Signed)
Patient ID: David [name], male   DOB: December 30, 1965, 52 y.o.   MRN: [id number] HeartMate 3 Rounding Note  Subjective:    Feels good this am. Less sore Says he has not had a BM in a week but his abdomen feels fine. HD yesterday Just had blood cultures drawn. Remains afebrile but WBC ct still trending upward to 24 today.  LVAD INTERROGATION:  HeartMate IIl LVAD:  Flow 4.9 liters/min, speed 5400, power 4, PI 3.5   Objective:    Vital Signs:   Temp:  [97.6 F (36.4 C)-98.3 F (36.8 C)] 98.3 F (36.8 C) (01/24 1933) Pulse Rate:  [51-88] 86 (01/25 0549) Resp:  [14-22] 16 (01/25 0331) BP: (76-121)/(30-90) 98/85 (01/25 0549) SpO2:  [84 %-100 %] 92 % (01/24 1617) Weight:  [66.5 kg (146 lb 9.7 oz)-68.3 kg (150 lb 9.2 oz)] 66.5 kg (146 lb 9.7 oz) (01/25 0600) Last BM Date: 05/14/17 Mean arterial Pressure 80's to 90 Intake/Output:   Intake/Output Summary (Last 24 hours) at 05/19/2017 0839 Last data filed at 05/19/2017 0559 Gross per 24 hour  Intake 1386 ml  Output 2630 ml  Net -1244 ml     Physical Exam: General:  Looks brighter today, smiling.   No resp difficulty Cor:  LVAD hum present. Lungs: clear Abdomen: soft, nontender, nondistended. Good bowel sounds. Abdominal incision well-healed. No tenderness over drive line. Extremities: minimal bilateral LE edema. TED stockings on Neuro: alert & orientedx3,  moves all 4 extremities w/o difficulty. Affect pleasant The sternotomy incision is well-healed. Driveline dressing to be done by VAD coordinator.  Telemetry: sinus rhythm 80's  Labs: Basic Metabolic Panel: Recent Labs  Lab 05/15/17 0511  05/16/17 0336  05/17/17 0503 05/17/17 1522 05/18/17 0318 05/18/17 1522 05/19/17 0246  NA 135   < > 135   < > 134* 138 136 133* 135  K 2.8*   < > 3.3*   < > 3.2* 3.5 2.9* 3.4* 3.4*  CL 94*   < > 99*   < > 94* 96* 91* 97* 96*  CO2 30   < > 27   < > 28 32 31 28 28   GLUCOSE 89   < > 93   < > 89 124* 75 114* 93  BUN 10   < > <5*   < > 11  14 18  5* 10  CREATININE 3.78*   < > 2.62*   < > 3.66* 4.19* 3.96* 2.11* 2.65*  CALCIUM 8.8*   < > 8.0*   < > 8.6* 8.6* 8.7* 7.7* 8.4*  MG 1.5*  --  1.6*  --  1.4*  --  2.1  --  1.7  PHOS 2.6   < > 1.9*   < > 2.7 2.7 3.2 1.6* 2.4*   < > = values in this interval not displayed.    Liver Function Tests: Recent Labs  Lab 05/17/17 0503 05/17/17 1522 05/18/17 0318 05/18/17 1522 05/19/17 0246  ALBUMIN 1.6* 1.6* 1.6* 1.6* 1.7*   No results for input(s): LIPASE, AMYLASE in the last 168 hours. No results for input(s): AMMONIA in the last 168 hours.  CBC: Recent Labs  Lab 05/15/17 0511 05/16/17 0336 05/17/17 0503 05/18/17 0318 05/19/17 0246  WBC 14.7* 17.1* 17.6* 20.8* 24.0*  HGB 8.5* 7.8* 8.0* 8.2* 8.7*  HCT 27.7* 25.8* 26.9* 27.0* 27.1*  MCV 86.8 86.6 87.6 86.3 86.0  PLT 379 347 365 364 345    INR: Recent Labs  Lab 05/15/17 0511 05/16/17 0336 05/17/17 0503 05/18/17 0174  05/19/17 0246  INR 4.60* 4.76* 3.81 3.33 2.99    Other results:  EKG:   Imaging: No results found.   Medications:     Scheduled Medications: . aspirin EC  81 mg Oral Daily  . atorvastatin  40 mg Oral q1800  . chlorhexidine  15 mL Mouth Rinse BID  . citalopram  10 mg Oral Daily  . darbepoetin (ARANESP) injection - DIALYSIS  200 mcg Intravenous Q Thu-HD  . feeding supplement  1 Container Oral BID BM  . ferrous [id number] C-folic acid  1 capsule Oral BID BM  . gabapentin  200 mg Oral BID  . lactulose  20 g Oral Once  . mouth rinse  15 mL Mouth Rinse q12n4p  . pantoprazole  40 mg Oral Daily  . polyethylene glycol  17 g Oral Daily  . potassium chloride  20 mEq Oral Daily  . torsemide  60 mg Oral BID  . Warfarin - Pharmacist Dosing Inpatient   Does not apply q1800    Infusions: . sodium chloride Stopped (04/30/17 0800)  . ferric gluconate (FERRLECIT/NULECIT) IV Stopped (05/18/17 1106)  . [START ON 05/20/2017] levofloxacin (LEVAQUIN) IV    . levofloxacin (LEVAQUIN) IV Stopped  (05/18/17 0940)  . magnesium sulfate 1 - 4 g bolus IVPB 2 g (05/19/17 0815)    PRN Medications: heparin, levalbuterol, ondansetron (ZOFRAN) IV, oxyCODONE, simethicone, sodium chloride flush, sodium chloride flush   Assessment:   1. Ischemic cardiomyopathy s/p multiple PCI's with chronic systolic heart failure and EF 20% on home dobutamine preop with significant improvement in symptoms. Evaluated at [facility] for heart transplant but LVAD felt to be the best treatment for him at this time. POD28s/p HeartMate III LVAD. He was critically ill with multi-system organ failure but has continued to recover and only issue now is renal failure requiring HD. Hemodynamics and VAD parameters very stable 2.H/OHypertension 3.H/OHyperlipidemia 4. PAF in sinuspreopon amiodarone and Eliquis.Now back in sinus off amiodarone.  5.H/OStage III CKD. Acute on chronic postop renal failureon intermittent HD. Making good urine with Demedex. Would plan to hold off on HD for a while to see how volume status and creat trend. 6. LBBB s/p CRT-D upgrade in 10/2016 without improvement in symptoms or EF. LV epicardial leads cut off at the time of surgery. 7. Prior smoking with mild obstruction and severe diffusion defect on PFT's. Continue IS. 8. Postop leukocytosis and fever. WBC normalized but now trending up daily to 24 today. He was started on Keflex and finished course of Augmentin. Started on Levaquin now. The only concerning source would be dialysis catheter infection. BC drawn today. Afebrile and feels fine. 9. Marked rise in bilirubin and transaminases. Likely due to pump runand right sided congestion, hypotension.Resolved   10. INR coming down slowly. Coumadin on hold. Coumadin per pharmacy.     11. Severe malnutrition with albumin 1.6-1.7. He is eating fairly well now.  Continue to encourage nutrition.    12. PT    13. Anemia: follow. He is on iron.           Plan/Discussion:    Overall he has been  very stable. MAP in good range.  VAD flow has been great. Would hold off on further HD unless he clearly needs it.  Continue exit site dressings.  Levaquin for progressive leukocytosis of unclear etiology. Follow up on random blood cultures. Continue nutrition, ambulation, IS.   I reviewed the LVAD parameters from today, and compared the results to the patient's prior recorded  data.  No programming changes were made.  The LVAD is functioning within specified parameters.   LVAD interrogation was negative for any significant power changes, alarms or PI events/speed drops.  LVAD equipment check completed and is in good working order.  Back-up equipment present.   LVAD education done on emergency procedures and precautions and reviewed exit site care.  Length of Stay: [address]  Gaye Pollack 05/19/2017, 8:39 AM

## 2017-05-19 NOTE — Progress Notes (Signed)
CARDIAC REHAB PHASE I   PRE:  Rate/Rhythm: 88 SR    BP: sitting 90 MAP    SaO2:   MODE:  Ambulation: 850 ft   POST:  Rate/Rhythm: 95 SR    BP: sitting 94 MAP     SaO2:  Pt in bed but agreeable to walk. He was able to sit up, don batteries with x1 mistake (briefly tried to put battery to power cord, his most common mistake), tell me to get black bag, stand and walk without assist. He wanted to use O2 today. Staff reports no SaO2 has been able to register. No c/o walking, steady, had good mechanics. Return to bed, switched back to power cord without any mistakes. His system controller is attached around his waist now, congratulated him on making the decision to attach it. He sts someone else made him. Wife present, able to tell about black bag. Encouraged pt to walk over weekend, to ask staff to help him set up.  [place], ACSM 05/19/2017 2:39 PM

## 2017-05-19 NOTE — Consult Note (Signed)
[facility]ious [facility]    Date of Admission:  04/19/2017     Total days of antibiotics 29   Day 1 Levaquin                Reason for Consult: leukocytosis     Referring Provider: Bensimhon   Active Problems:   Acute on chronic systolic (congestive) heart failure (HCC)   LVAD (left ventricular assist device) present ([place])   Cardiogenic shock (HCC)  Assessment: 52 y.o. male s/p HM3 LVAD placement for end-stage HF now dialysis dependent via tunneled HD catheter. He has had worsening leukocytosis since 05/10/17 after his HD cath placement steadily 11.2 > 24 although he has remained normothermic. In early post-op period he was treated for HCAP with resolution of leukocytosis and clinical improvement. He has had some trauma to the driveline site with worsening drainage from the exit site. This has been treated with Keflex/Augmentin x 1 week. His driveline looks improved compared to pictures in the chart 2 days ago after he had some trauma to the site, however still with some scant drainage.  Plan: 1. Change Levaquin to linezolid 600 mg BID x 5 more days for driveline drainage to cover for consideration of MRSA involvement. He had a positive nasal PCR 05/01/17 - aside from one dose of vancomycin on 1/21 which was removed with HD previous regimens have not covered thus far.  2. Would hold citalopram for now to avoid interaction with linezolid with serotonin syndrome.  3. Add differential to CBC in AM 4. Would remove HD line as soon as possible considering it's source potential and his counts have increased since this has been placed. Will follow blood cultures.  5. If his leukocytosis persists over the weekend would recommend stopping antibiotics with close observation.   Dr. Tommy Medal will follow up to see how he is doing tomorrow.   HPI: David [name] is a 52 y.o. male with ICM, HFrEF now s/p HM 3 LVAD 04/20/2017 (oversewn AV), CKD 3 now dialysis dependent which is new  this admission. He initially required CRRT following surgery and transitioned to intermittent HD sessions via a tunneled dialysis catheter placed in IR on 05/08/17.  Previous Antibiotics:  Cefepime 12/30 >> 04/27/17 Meropenem 04/27/16 >> 05/07/17 (Achromobacter in sputum, R-cefepime, cefazolin) Keflex 1/17 >> 1/21 driveline drainage  Augmentin 1/21 >> 1/24 driveline drainage w/ worsened WBC  Levaquin 1/24 >>  Blood cultures were drawn today with worsening leukocytosis since 05/10/17 and is now up to 24k. CXR last on 1/21 without any definitive infiltrate. He has no productive cough or shortness of breath. He does have an intermittent dry cough with activity.   Daxtyn's only complaints today include constipation (has not had a BM since Monday) and chest pain with coughing over sternum. He reports he has been walking well and appetite is improving.  Review of Systems: Review of Systems  Constitutional: Negative for chills, fever, malaise/fatigue and weight loss.  HENT: Negative for sore throat.   Respiratory: Positive for cough (intermittent with activity ). Negative for sputum production.   Cardiovascular: Positive for chest pain. Negative for leg swelling.  Gastrointestinal: Positive for constipation. Negative for abdominal pain, diarrhea and vomiting.  Genitourinary: Negative for dysuria and flank pain.  Musculoskeletal: Negative for joint pain, myalgias and neck pain.  Skin: Negative for rash.  Neurological: Positive for weakness. Negative for dizziness, tingling and headaches.  Psychiatric/Behavioral: Negative for depression and substance abuse. The patient  is not nervous/anxious and does not have insomnia.     Marland Kitchen aspirin EC  81 mg Oral Daily  . atorvastatin  40 mg Oral q1800  . chlorhexidine  15 mL Mouth Rinse BID  . citalopram  10 mg Oral Daily  . darbepoetin (ARANESP) injection - DIALYSIS  200 mcg Intravenous Q Thu-HD  . feeding supplement  1 Container Oral BID BM  . ferrous  [id number] C-folic acid  1 capsule Oral BID BM  . gabapentin  200 mg Oral BID  . lactulose  20 g Oral Once  . mouth rinse  15 mL Mouth Rinse q12n4p  . pantoprazole  40 mg Oral Daily  . polyethylene glycol  17 g Oral Daily  . potassium chloride  20 mEq Oral Daily  . sorbitol  30 mL Oral Once  . torsemide  60 mg Oral BID  . Warfarin - Pharmacist Dosing Inpatient   Does not apply q1800    Past Medical History:  Diagnosis Date  . AICD (automatic cardioverter/defibrillator) present   . Anemia   . Anxiety   . CAD (coronary artery disease) 2009   AMI in 12/2007 with PCI to LAD, staged PCI to  mid/distal RCA, NSTEMI in 02/2009 with BMS to LCx  . CHF (congestive heart failure) ([place])   . Chronic kidney disease 11/03/2016   stage 3 kidney disease  . Dysrhythmia    "arrythmia problems at some point", "fatal rhythms"  . History of blood transfusion    "I was bleeding from was rectum" (04/19/2017)  . HLD (hyperlipidemia)   . HTN (hypertension)   . Ischemic cardiomyopathy    Admitted in 07/2010 with CHF exacerbation  . Myocardial infarction ([place])    "I've had 4" (04/19/2017)  . Pneumonia 2018 X 1  . Seizures ([place])    one seizure in 04/2016 during cardiac event (04/19/2017)    Social History   Tobacco Use  . Smoking status: Former Smoker    Packs/day: 0.50    Years: 43.00    Pack years: 21.50    Types: Cigarettes, Cigars    Last attempt to quit: 04/29/2016    Years since quitting: 1.0  . Smokeless tobacco: Never Used  Substance Use Topics  . Alcohol use: No  . Drug use: No    Family History  Problem Relation Age of Onset  . Coronary artery disease Father   . Hypertension Father   . Hypertension Sister   . Coronary artery disease Brother   . Emphysema Brother   . Coronary artery disease Mother   . Hypertension Mother   . Emphysema Mother   . Hypertension Daughter   . Obesity Daughter   . Diabetes Unknown   . Hypertension Unknown   . Coronary artery disease  Unknown    Allergies  Allergen Reactions  . Plavix [Clopidogrel Bisulfate] Hives    OBJECTIVE: Blood pressure 90/79, pulse 86, temperature 97.9 F (36.6 C), temperature source Oral, resp. rate 16, height 5\' 5"  (1.651 m), weight 146 lb 9.7 oz (66.5 kg), SpO2 93 %.  Physical Exam  Constitutional: He is oriented to person, place, and time and well-developed, well-nourished, and in no distress. Vital signs are normal.  HENT:  Mouth/Throat: Oropharynx is clear and moist. No oral lesions. Normal dentition. No dental caries.  edentulous   Eyes: Pupils are equal, round, and reactive to light. No scleral icterus.  Cardiovascular: Normal rate and regular rhythm.  LVAD hum auscultated without native heart tones   Pulmonary/Chest: Effort  normal and breath sounds normal.  Tunneled HD catheter to right chest clean, dry and intact. No streaking, erythema, edema or tenderness. Scant dried bloody drainage.   Abdominal: Soft. He exhibits no distension. There is no tenderness.  LVAD Driveline site assessed - small bloody drainage noted to Aquacel. Erythema encircling driveline ~6KR without fluctuance or edema. No tenderness palpated over tunneled portion of driveline.    Musculoskeletal: Normal range of motion.  Lymphadenopathy:    He has no cervical adenopathy.  Neurological: He is alert and oriented to person, place, and time.  Skin: Skin is warm and dry. No rash noted.  Psychiatric: Mood and affect normal.  Vitals reviewed. No wounds   Lab Results Lab Results  Component Value Date   WBC 24.0 (H) 05/19/2017   HGB 8.7 (L) 05/19/2017   HCT 27.1 (L) 05/19/2017   MCV 86.0 05/19/2017   PLT 345 05/19/2017    Lab Results  Component Value Date   CREATININE 2.65 (H) 05/19/2017   BUN 10 05/19/2017   NA 135 05/19/2017   K 3.4 (L) 05/19/2017   CL 96 (L) 05/19/2017   CO2 28 05/19/2017    Lab Results  Component Value Date   ALT 32 05/07/2017   AST 45 (H) 05/07/2017   ALKPHOS 261 (H) 05/07/2017    BILITOT 3.2 (H) 05/07/2017     Microbiology: No results found for this or any previous visit (from the past 240 hour(s)).  Janene Madeira, MSN, NP-C [facility] for Infectious [place] Cell: [phone] Pager: [phone]  05/19/2017 10:41 AM

## 2017-05-19 NOTE — Progress Notes (Signed)
[facility] for Coumadin Indication: LVAD  Allergies  Allergen Reactions  . Plavix [Clopidogrel Bisulfate] Hives   Patient Measurements: Height: 5\' 5"  (165.1 cm) Weight: 146 lb 9.7 oz (66.5 kg) IBW/kg (Calculated) : 61.5  Vital Signs: BP: 98/85 (01/25 0549) Pulse Rate: 86 (01/25 0549)  Labs: Recent Labs    05/17/17 0503  05/18/17 0318 05/18/17 1522 05/19/17 0246  HGB 8.0*  --  8.2*  --  8.7*  HCT 26.9*  --  27.0*  --  27.1*  PLT 365  --  364  --  345  LABPROT 37.2*  --  33.6*  --  30.8*  INR 3.81  --  3.33  --  2.99  CREATININE 3.66*   < > 3.96* 2.11* 2.65*   < > = values in this interval not displayed.   Estimated Creatinine Clearance: 28 mL/min (A) (by C-G formula based on SCr of 2.65 mg/dL (H)).  Assessment: 52 yo male s/p LVAD implantation 12/27.   Heparin drip stopped 1/17  Warfarin on hold  INR now down 4.6 > 3.8 > 3.3>2.99.  No overt bleeding noted. Hgb low stable 8  LDH 200s has been stable. Plan for HD graft this week when INR low enough - will need to bridge with heparin when INR < 2 Not eating much - instructed to drink ensure or boost every day   Goal of Therapy:  INR 2-2.5 Monitor platelets by anticoagulation protocol: Yes    Plan: Hold warfarin again tonight Daily PT/INR  Bonnita Nasuti Pharm.D. CPP, BCPS Clinical Pharmacist [phone] 05/19/2017 9:03 AM

## 2017-05-19 NOTE — Progress Notes (Signed)
[facility] Progress Note    Assessment/ Plan:    60G with systolic CHF s/p LVAD 31/51  with AKI  On CKD--> ESRD, new-start HD.    1.  AKI on CKD III--> ESRD: likely due to ATN from shock s/p LVAD placement with underlying arterionephrosclerosis.  Required CRRT initially and then transitioned to IHD (1/11).  S/p TDC with IR 05/08/17.  UOP good (1900 yest) but crt inc from 1.7 to 4.19 off of HD, however, then plateaued ??, last HD 1/24- s/p vein mapping 05/12/17, was considering  perm access placement but plan is to table that for right now. He wants to table b/c he feels renal function will recover, however I do not know if would be worth putting him through that.  At cards request and because numbers may have possibly  plateaued off of HD will plan to hold HD and watch - volume if not an issue- query if dry but will defer to them.  Working on [name] to OP unit- lives in [name]- need to make sure they can take LVAD pt if he is to continue HD   2.  Acute on chronic systolic CHF: s/p Heartmate III 12/27.  Advanced HF and cardiothoracic surgery following.    Coumadin has been restarted (1/14).  On torsemide 60 BID to augment UOP- maybe getting dry- will ask cards  3.  Elevated LFTs/ Hepatic dysfunction- labile INRs,  Per primary  4.  Hyponatremia: vol related,  Improving.   5.  Achromobacter pneumonia: s/p antibiotics (ended 1/13, 10 days)  6.  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 Hypokalemia- on high K bath- running on 4 K today- may need repletion as well - will start 20 daily   8. Anemia- iron stores low, giving iron - on darbe 200 q week  9. Check PTH, phos OK no binder - actually low  Subjective:    No new complaints   Objective:   BP 98/85 (BP Location: Right Arm)   Pulse 86   Temp 98.3 F (36.8 C) (Oral)   Resp 16   Ht 5\' 5"  (1.651 m)   Wt 66.5 kg (146 lb 9.7 oz)   SpO2 92%   BMI 24.40 kg/m   Intake/Output Summary (Last 24 hours) at 05/19/2017 7616 Last data  filed at 05/19/2017 0559 Gross per 24 hour  Intake 1386 ml  Output 2630 ml  Net -1244 ml   Weight change: -0.6 kg (-5.2 oz)  Physical Exam: WVP:XTGGY gentleman, NAD, lying in bed HEENT: some JVD upright CVS: mechanical hum of LVAD Resp: normal WOB today, clear anteriorly, some muffled bilateral bses Abd: + drive line LUQ, dressed Ext: no edema ACCESS: R IJ TDC In place NEURO:  AAO x 3 and able to appropriately answer questions  Imaging: No results found.  Labs: BMET Recent Labs  Lab 05/16/17 0336 05/16/17 2010 05/17/17 0503 05/17/17 1522 05/18/17 0318 05/18/17 0653 05/18/17 1522 05/19/17 0246  NA 135 133* 134* 138 136  --  133* 135  K 3.3* 3.3* 3.2* 3.5 2.9*  --  3.4* 3.4*  CL 99* 97* 94* 96* 91*  --  97* 96*  CO2 27 27 28  32 31  --  28 28  GLUCOSE 93 104* 89 124* 75  --  114* 93  BUN <5* 9 11 14 18   --  5* 10  CREATININE 2.62* 3.49* 3.66* 4.19* 3.96*  --  2.11* 2.65*  CALCIUM 8.0* 8.2* 8.6* 8.6* 8.7* 8.6* 7.7* 8.4*  PHOS 1.9* 2.7 2.7 2.7 3.2  --  1.6* 2.4*   CBC Recent Labs  Lab 05/16/17 0336 05/17/17 0503 05/18/17 0318 05/19/17 0246  WBC 17.1* 17.6* 20.8* 24.0*  HGB 7.8* 8.0* 8.2* 8.7*  HCT 25.8* 26.9* 27.0* 27.1*  MCV 86.6 87.6 86.3 86.0  PLT 347 365 364 345    Medications:    . aspirin EC  81 mg Oral Daily  . atorvastatin  40 mg Oral q1800  . chlorhexidine  15 mL Mouth Rinse BID  . citalopram  10 mg Oral Daily  . darbepoetin (ARANESP) injection - DIALYSIS  200 mcg Intravenous Q Thu-HD  . feeding supplement  1 Container Oral BID BM  . ferrous [id number] C-folic acid  1 capsule Oral BID BM  . gabapentin  200 mg Oral BID  . lactulose  20 g Oral Once  . mouth rinse  15 mL Mouth Rinse q12n4p  . pantoprazole  40 mg Oral Daily  . polyethylene glycol  17 g Oral Daily  . potassium chloride  20 mEq Oral Daily  . sorbitol  30 mL Oral Once  . torsemide  60 mg Oral BID  . Warfarin - Pharmacist Dosing Inpatient   Does not apply q1800       David [name] A  05/19/2017, 9:21 AM

## 2017-05-19 NOTE — Progress Notes (Signed)
LVAD Coordinator Rounding Note:  Admitted12/26due to decompensated heart failure.  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  Vital signs: Tmax: 98.2 HR:86 Doppler Pressure: 90 Automatic cuff: 90/79 (85) O2 Sat:93% on1LNC Wt:157>160>164>167>169>167>168>167>158>157>154>151>152>150>146lbs  LVAD interrogation reveals:  Speed:5400 Flow: 5.3 Power:4.1w PI:3.1 Alarms:none Events:none Hematocrit:27 Fixed speed:5400 Low speed limit:5100  Drive line site: dressing dry and intact; anchor intact and accurately applied  Labs:  LDH trend:376>355>315>265>263>281>293>224>231>259>242  INRtrend:1.49>1.63>1.99>1.97>2.52>3.72>4.60>4.76>3.81>3.33>2.99  Anticoagulation Plan: -INR Goal:2-2.5 -ASA Dose: 81 mg   Blood Products: Intra op:2 units/FFP 04/20/17  Post YE:BXID'H 1 unit 12/29 1 unit 12/30 1 unit 04/25/17 1 unit 05/02/17 1 unit 05/08/17  Device:Medtronic BiV -Therapies:on  Arrhythmia: 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 (on amiodarone and Eliquis for this) - Afib 12/29 - 04/28/17 - NSR 1/5 - present  Nitric Oxide: - stopped on 12/30 due to initiation of BiPap  Respiratory: - Extubated 12/28 - Reintubated 04/23/2017 - Extubated 04/26/2017  Renal:  04/23/17 - CRRT started for AKI - stopped 05/05/17 -Intermittent HD Monday, Wednesday, Friday   Infection: - 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 2. Wife or nurse champion will change VAD dressing over the weekend.  2.Please call VAD Pager for any equipment concerns or  drive line dressing questions/concerns. 3. VAD education to be completed at [facility] February 5th.   Zada Girt RN, VAD Coordinator 24/7 pager [phone]

## 2017-05-19 NOTE — Progress Notes (Signed)
[facility] for Coumadin Indication: LVAD  Allergies  Allergen Reactions  . Plavix [Clopidogrel Bisulfate] Hives   Patient Measurements: Height: 5\' 5"  (165.1 cm) Weight: 146 lb 9.7 oz (66.5 kg) IBW/kg (Calculated) : 61.5  Vital Signs: Temp: 98.2 F (36.8 C) (01/25 1243) Temp Source: Oral (01/25 1243) BP: 99/75 (01/25 1243) Pulse Rate: 86 (01/25 0813)  Labs: Recent Labs    05/17/17 0503  05/18/17 0318 05/18/17 1522 05/19/17 0246  HGB 8.0*  --  8.2*  --  8.7*  HCT 26.9*  --  27.0*  --  27.1*  PLT 365  --  364  --  345  LABPROT 37.2*  --  33.6*  --  30.8*  INR 3.81  --  3.33  --  2.99  CREATININE 3.66*   < > 3.96* 2.11* 2.65*   < > = values in this interval not displayed.   Estimated Creatinine Clearance: 28 mL/min (A) (by C-G formula based on SCr of 2.65 mg/dL (H)).  Assessment: 52 yo male s/p LVAD implantation 12/27.   Heparin drip stopped 1/17  Warfarin on hold  INR now down 4.6 > 3.8 > 3.3>2.99.  No overt bleeding noted. Hgb low stable 8  LDH 200s has been stable. Plan for HD graft this week when INR low enough - will need to bridge with heparin when INR < 2 Not eating much - instructed to drink ensure or boost every day   Goal of Therapy:  INR 2-2.5 Monitor platelets by anticoagulation protocol: Yes    Plan: Hold warfarin again tonight Daily PT/INR  Bonnita Nasuti Pharm.D. CPP, BCPS Clinical Pharmacist [phone] 05/19/2017 2:05 PM

## 2017-05-19 NOTE — Progress Notes (Signed)
Patient ID: David [name], male   DOB: Nov 25, 1965, 52 y.o.   MRN: [id number]   Advanced Heart Failure VAD Team Note  Subjective:    David [name] is a 52 y.o. male with a PMH of ICM ('09 PCI to LAD, mRCA, '10 PCI to Lcx and '12 mRCA), HFrEF (EF ~20% for >5 years), HLD and HTN admitted 12/26 for VAD placement due to end-stage systolic HF.   - Underwent successful HM-3 LVAD placement 04/20/17. 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 CVVHD started 12/30 - Started on Meropenem 04/27/16 with + Achromobacter in tracheal aspirate. (Finished 05/07/17) - CVVHD stopped 1/10 - 1st iHD 1/12 - Tunneled HD cath placed 05/08/17 - Keflex for mild drainage from Driveline 5/99/35 switched augmentin . Completed augmentin 1/23  Started on Levaquin yesterday. WBC still climbing. Had HD yesterday.  Urinated 1.9 liters. Appetite improving. Denies SOB.    Echo 12/31 reviewed: No significant pericardial effusion, IV septum midline to slightly to the right, RV function adequate.  LVAD INTERROGATION:  HeartMate III LVAD:  Flow 4.9   liters/min, speed 5400, power 4, PI 3.5  .   Objective:    Vital Signs:   Temp:  [97.6 F (36.4 C)-98.3 F (36.8 C)] 98.3 F (36.8 C) (01/24 1933) Pulse Rate:  [74-88] 86 (01/25 0549) Resp:  [14-22] 16 (01/25 0331) BP: (76-121)/(30-90) 98/85 (01/25 0549) SpO2:  [84 %-100 %] 92 % (01/24 1617) Weight:  [146 lb 9.7 oz (66.5 kg)-150 lb 9.2 oz (68.3 kg)] 146 lb 9.7 oz (66.5 kg) (01/25 0600) Last BM Date: 05/14/17 Mean arterial Pressure 70-80s  Intake/Output:   Intake/Output Summary (Last 24 hours) at 05/19/2017 0851 Last data filed at 05/19/2017 0559 Gross per 24 hour  Intake 1386 ml  Output 2630 ml  Net -1244 ml   Physical Exam  Maps 80-90s Physical Exam: GENERAL: In bed. NAD. HEENT: normal  NECK: Supple, JVP 6-7  .  2+ bilaterally, no bruits.  No lymphadenopathy or thyromegaly appreciated.   CARDIAC:  Mechanical heart sounds with LVAD hum present. R upper chest HD  catheter. No drainage LUNGS:  Clear to auscultation bilaterally.  ABDOMEN:  Soft, round, nontender, positive bowel sounds x4.     LVAD exit site: Dressing dry and intact.  No erythema or drainage.  Stabilization device present and accurately applied.  Driveline dressing is being changed daily per sterile technique. EXTREMITIES:  Warm and dry, no cyanosis, clubbing, rash. Trace edema  NEUROLOGIC:  Alert and oriented x 4.  Gait steady.  No aphasia.  No dysarthria.  Affect pleasant.         Telemetry   NSR 80-90s  Personally reviewed    Labs   Basic Metabolic Panel: Recent Labs  Lab 05/15/17 0511  05/16/17 0336  05/17/17 0503 05/17/17 1522 05/18/17 0318 05/18/17 1522 05/19/17 0246  NA 135   < > 135   < > 134* 138 136 133* 135  K 2.8*   < > 3.3*   < > 3.2* 3.5 2.9* 3.4* 3.4*  CL 94*   < > 99*   < > 94* 96* 91* 97* 96*  CO2 30   < > 27   < > 28 32 31 28 28   GLUCOSE 89   < > 93   < > 89 124* 75 114* 93  BUN 10   < > <5*   < > 11 14 18  5* 10  CREATININE 3.78*   < > 2.62*   < >  3.66* 4.19* 3.96* 2.11* 2.65*  CALCIUM 8.8*   < > 8.0*   < > 8.6* 8.6* 8.7* 7.7* 8.4*  MG 1.5*  --  1.6*  --  1.4*  --  2.1  --  1.7  PHOS 2.6   < > 1.9*   < > 2.7 2.7 3.2 1.6* 2.4*   < > = values in this interval not displayed.    Liver Function Tests: Recent Labs  Lab 05/17/17 0503 05/17/17 1522 05/18/17 0318 05/18/17 1522 05/19/17 0246  ALBUMIN 1.6* 1.6* 1.6* 1.6* 1.7*   No results for input(s): LIPASE, AMYLASE in the last 168 hours. No results for input(s): AMMONIA in the last 168 hours.  CBC: Recent Labs  Lab 05/15/17 0511 05/16/17 0336 05/17/17 0503 05/18/17 0318 05/19/17 0246  WBC 14.7* 17.1* 17.6* 20.8* 24.0*  HGB 8.5* 7.8* 8.0* 8.2* 8.7*  HCT 27.7* 25.8* 26.9* 27.0* 27.1*  MCV 86.8 86.6 87.6 86.3 86.0  PLT 379 347 365 364 345    INR: Recent Labs  Lab 05/15/17 0511 05/16/17 0336 05/17/17 0503 05/18/17 0318 05/19/17 0246  INR 4.60* 4.76* 3.81 3.33 2.99    Other  results:  Imaging   No results found.   Medications:     Scheduled Medications: . aspirin EC  81 mg Oral Daily  . atorvastatin  40 mg Oral q1800  . chlorhexidine  15 mL Mouth Rinse BID  . citalopram  10 mg Oral Daily  . darbepoetin (ARANESP) injection - DIALYSIS  200 mcg Intravenous Q Thu-HD  . feeding supplement  1 Container Oral BID BM  . ferrous [id number] C-folic acid  1 capsule Oral BID BM  . gabapentin  200 mg Oral BID  . lactulose  20 g Oral Once  . mouth rinse  15 mL Mouth Rinse q12n4p  . pantoprazole  40 mg Oral Daily  . polyethylene glycol  17 g Oral Daily  . potassium chloride  20 mEq Oral Daily  . torsemide  60 mg Oral BID  . Warfarin - Pharmacist Dosing Inpatient   Does not apply q1800    Infusions: . sodium chloride Stopped (04/30/17 0800)  . ferric gluconate (FERRLECIT/NULECIT) IV Stopped (05/18/17 1106)  . [START ON 05/20/2017] levofloxacin (LEVAQUIN) IV    . levofloxacin (LEVAQUIN) IV Stopped (05/18/17 0940)  . magnesium sulfate 1 - 4 g bolus IVPB 2 g (05/19/17 0815)    PRN Medications: heparin, levalbuterol, ondansetron (ZOFRAN) IV, oxyCODONE, simethicone, sodium chloride flush, sodium chloride flush   Patient Profile   David [name] is a 51 y.o. male with a PMH of ICM ('09 PCI to LAD, mRCA, '10 PCI to Lcx and '12 mRCA), HFrEF (EF ~20% for >5 years) s/p HM-III VAD placement on 12/27 due to end-stage systolic HF.   Assessment/Plan:    1. Acute on chronic systolic HF: Echo 15/17/61 with LVEF 20-25%, Mild MR, Mild/Mod MR, Severe LAE, Mildly reduced RV, PA peak pressure 56 mm Hg.  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 Extubated, initially stopped epinephrine/norepinephrine but restarted with fall in UOP and MAP.  With progressive renal failure, pulmonary edema, and hypotension, he was re-intubated on 12/30.   CVVH was begun.  12/31 was difficult day with refractory shock (MAP in 60s).  Reviewed 12/31 echo  which showed no significant pericardial effusion, adequate RV function, and IV septum near mid-line.  Off all pressors. Stable.  - Continue aspirin 81 mg daily.  - Warfarin goal INR 2-2.5. INR 2.9    -  Coumadin has been on hold.  2. CAD s/p PCI to mid RCA and PLOM with DES x 2 and DES to mid LAD in 1/18.   - Continue ASA and statin.  -no S/S ischemia.  3. PAF: - Remains in NSR. 4. ARF on CKD stage III: Suspect some degree of intrinsic renal disease prior to LVAD placement (baseline creatinine around 1.5) with probable ATN from intra-op/peri-op hypotension and development of vasodilatory/septic shock.  CVVHD stopped 05/04/2017. First iHD 05/06/17 without problems.Tunneled HD cath placed 05/08/17.  Waiting on renal recovery.  -  Vein mapping done plan for AV graft placement if no recovery.  - INR 2.99 5. Anemia: s/p 1 u PRBCs 12/30, 04/25/17, 1/8.  -Hgb 8.7  - Continue aranesp .  - Want to limit transfusion with possibility of transplant down the road. Follow closely.  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 ID: Started on cefepime empirically with septic/vasodilatory shock, PCT was 31 04/25/17.  ABX broadened to Meropenem 04/27/16 with + Achromobacter on tracheal aspirate.  - Finished 05/07/17 (10 days)  PCT 8.31 04/27/17 -> 3.87 ->0.94 - Afebrile.  -Completed augmentin course.   - WBC trending up 14.7>1>17.6 >20.8>24.   ? HD catheter may be a possible source.  -Drive Line has been ok.  - Obtain blood cultures.  - Continue IS  7. Elevated LFTs due to shock liver - LFTs improved but albumin low. Suspect compromised liver synthetic function. Encouraged protein intake. 8. Hyponatremia Sodium 135   9. Driveline bleeding - Resolved after stitch placed by Dr. Cyndia Bent.  10. Constipation - limit narcotics - add sorbitol.  11. Pain - reduce oxycodone - Continue gabapentin 200 bid 12. Protein-calorie malnutrition - Nutrition appreciated.  - encouraged protein intake - Prealbumin  12.3.     Length of Stay: [place], NP 05/19/2017, 8:51 AM  VAD Team --- VAD ISSUES ONLY--- Pager [phone] (7am - 7am) Advanced Heart Failure Team  Pager [phone] (M-F; 7a - 4p)  Please contact [facility] for night-coverage after hours (4p -7a ) and weekends on [email or web address]  Patient seen and examined with Darrick Grinder, NP. We discussed all aspects of the encounter. I agree with the assessment and plan as stated above.   Making slow progress. Appetite improving slowly. Remains constipated. Had HD yesterday. Good urine output. Creatinine up some. WBC still climbing. Levaquin started yesterday. No fever. No clear localizing features. All lines have been changed except tunneled dialysis cath. VAD interrogated personally. Parameters stable.  On exam JVP 6-7 Cor LVAD hum  R chest tunneled cath Lungs crackles at bases  Ab soft NT driveline ok Ext SW-5+ edema  Urine output picking up. Creatinine trending up slowly. Will hold HD for a few days and see how he does. WBCC still climbing with unclear infectious source. No fever. Will repeat Cassandra. I have called ID and asked them to see. VAD interrogated personally. Parameters stable. Continue to mobilize.   Glori Bickers, MD  9:27 AM

## 2017-05-19 NOTE — Progress Notes (Signed)
Physical Therapy Treatment Patient Details Name: David [name] MRN: [id number] DOB: Mar 19, 1966 Today's Date: 05/19/2017    History of Present Illness This 52 y.o. male admitted for LVAD. Pt extubated 12/28.   on 12/30, pt developed respiratory distress with pulmonary edema, and increased creatanine.  He was intubated 12/30 > 04/26/16, and CRRT initiated 12/30 and discontinued 1/10. Pt now on intermittent HD.   PMH includes:  Acute on chronic systolic HF, CAD, PAF, CKD stage III, LBBB, frequent PVCs, seizures, PNS, MI, ischemic cardiomyopathy, s/p AICD     PT Comments    Pt performed power source transfer with intermittent cueing ( ~20%  Of the time ).  Pt required cues for safety with transfer and cues for technique with gait training.  Pt educated on LVAD precautions including protect drive line, avoiding tension on drive line and where to go in the event of a power outage. Pt pleasant and in good spirits during treatment this am.  Pt remains on O2.  Plan for stair training next session.    Follow Up Recommendations  Supervision/Assistance - 24 hour;Home health PT     Equipment Recommendations  Other (comment)(rollator)    Recommendations for Other Services       Precautions / Restrictions Precautions Precautions: Fall;Sternal Precaution Comments: remphasized the need to be aware of drive line Restrictions Weight Bearing Restrictions: No Other Position/Activity Restrictions: sternal precautions    Mobility  Bed Mobility Overal bed mobility: Modified Independent       Supine to sit: Modified independent (Device/Increase time)     General bed mobility comments: Pt performed without assistance and used good safety.    Transfers Overall transfer level: Needs assistance Equipment used: 4-wheeled walker Transfers: Sit to/from Stand Sit to Stand: Supervision Stand pivot transfers: Supervision       General transfer comment: Cues to lock brakes before standing or sitting on  rollator to improve safety.  Pt performed with good safety and technique to maintain sternal precautions.   Ambulation/Gait Ambulation/Gait assistance: Supervision Ambulation Distance (Feet): 450 Feet Assistive device: 4-wheeled walker Gait Pattern/deviations: Step-through pattern;Decreased stride length;Trunk flexed;Narrow base of support Gait velocity: decr Gait velocity interpretation: Below normal speed for age/gender General Gait Details: Cues for upper trunk control, Pt with good safety with rollator. Pt presents with decreased BOS but able to correct without cueing.     Stairs            Wheelchair Mobility    Modified Rankin (Stroke Patients Only)       Balance     Sitting balance-Leahy Scale: Good       Standing balance-Leahy Scale: Fair Standing balance comment: With intermittent UE support.                              Cognition Arousal/Alertness: Awake/alert Behavior During Therapy: WFL for tasks assessed/performed Overall Cognitive Status: Impaired/Different from baseline                       Memory: Decreased recall of precautions         General Comments: Pt able to recall sequencing for power source transfer.  Pt required cues for reviewing precautions with LVAD device.        Exercises      General Comments        Pertinent Vitals/Pain Pain Assessment: No/denies pain    Home Living  Prior Function            PT Goals (current goals can now be found in the care plan section) Acute Rehab PT Goals Patient Stated Goal: To get stronger and go home  Potential to Achieve Goals: Good Progress towards PT goals: Progressing toward goals    Frequency    Min 3X/week      PT Plan Current plan remains appropriate    Co-evaluation              AM-PAC PT "6 Clicks" Daily Activity  Outcome Measure  Difficulty turning over in bed (including adjusting bedclothes, sheets and  blankets)?: None Difficulty moving from lying on back to sitting on the side of the bed? : None Difficulty sitting down on and standing up from a chair with arms (e.g., wheelchair, bedside commode, etc,.)?: A Little Help needed moving to and from a bed to chair (including a wheelchair)?: A Little Help needed walking in hospital room?: A Little Help needed climbing 3-5 steps with a railing? : A Little 6 Click Score: 20    End of Session Equipment Utilized During Treatment: Oxygen(LVAD back up bag) Activity Tolerance: Patient tolerated treatment well Patient left: with call bell/phone within reach;in bed Nurse Communication: Mobility status(Informed nursing that patient did tremedously better with power source transfer.  ) PT Visit Diagnosis: Unsteadiness on feet (R26.81);Muscle weakness (generalized) (M62.81);Difficulty in walking, not elsewhere classified (R26.2)     Time: 0677-0340 PT Time Calculation (min) (ACUTE ONLY): 20 min  Charges:  $Gait Training: 8-22 mins                    G Codes:       David [name], PTA pager [phone]    David [name] 05/19/2017, 12:06 PM

## 2017-05-20 ENCOUNTER — Ambulatory Visit (HOSPITAL_COMMUNITY): Payer: Self-pay | Admitting: Cardiac Rehabilitation

## 2017-05-20 ENCOUNTER — Encounter (HOSPITAL_COMMUNITY): Payer: Self-pay | Admitting: Cardiac Rehabilitation

## 2017-05-20 DIAGNOSIS — Z992 Dependence on renal dialysis: Secondary | ICD-10-CM

## 2017-05-20 DIAGNOSIS — I5030 Unspecified diastolic (congestive) heart failure: Secondary | ICD-10-CM

## 2017-05-20 DIAGNOSIS — N186 End stage renal disease: Secondary | ICD-10-CM

## 2017-05-20 DIAGNOSIS — L03311 Cellulitis of abdominal wall: Secondary | ICD-10-CM

## 2017-05-20 LAB — CBC WITH DIFFERENTIAL/PLATELET
Basophils Absolute: 0.2 10*3/uL — ABNORMAL HIGH (ref 0.0–0.1)
Basophils Relative: 1 %
EOS ABS: 3.9 10*3/uL — AB (ref 0.0–0.7)
Eosinophils Relative: 19 %
HEMATOCRIT: 27.8 % — AB (ref 39.0–52.0)
Hemoglobin: 8.5 g/dL — ABNORMAL LOW (ref 13.0–17.0)
LYMPHS ABS: 1.2 10*3/uL (ref 0.7–4.0)
LYMPHS PCT: 6 %
MCH: 26.4 pg (ref 26.0–34.0)
MCHC: 30.6 g/dL (ref 30.0–36.0)
MCV: 86.3 fL (ref 78.0–100.0)
MONOS PCT: 6 %
Monocytes Absolute: 1.3 10*3/uL — ABNORMAL HIGH (ref 0.1–1.0)
Neutro Abs: 14.4 10*3/uL — ABNORMAL HIGH (ref 1.7–7.7)
Neutrophils Relative %: 68 %
Platelets: 334 10*3/uL (ref 150–400)
RBC: 3.22 MIL/uL — ABNORMAL LOW (ref 4.22–5.81)
RDW: 21.1 % — ABNORMAL HIGH (ref 11.5–15.5)
WBC: 20.8 10*3/uL — ABNORMAL HIGH (ref 4.0–10.5)

## 2017-05-20 LAB — PROTIME-INR
INR: 2.55
Prothrombin Time: 27.2 seconds — ABNORMAL HIGH (ref 11.4–15.2)

## 2017-05-20 LAB — RENAL FUNCTION PANEL
ANION GAP: 13 (ref 5–15)
ANION GAP: 13 (ref 5–15)
Albumin: 1.7 g/dL — ABNORMAL LOW (ref 3.5–5.0)
Albumin: 1.7 g/dL — ABNORMAL LOW (ref 3.5–5.0)
BUN: 16 mg/dL (ref 6–20)
BUN: 18 mg/dL (ref 6–20)
CALCIUM: 8.5 mg/dL — AB (ref 8.9–10.3)
CHLORIDE: 91 mmol/L — AB (ref 101–111)
CO2: 28 mmol/L (ref 22–32)
CO2: 26 mmol/L (ref 22–32)
Calcium: 8.3 mg/dL — ABNORMAL LOW (ref 8.9–10.3)
Chloride: 91 mmol/L — ABNORMAL LOW (ref 101–111)
Creatinine, Ser: 3.44 mg/dL — ABNORMAL HIGH (ref 0.61–1.24)
Creatinine, Ser: 3.68 mg/dL — ABNORMAL HIGH (ref 0.61–1.24)
GFR calc Af Amer: 22 mL/min — ABNORMAL LOW (ref >60)
GFR calc Af Amer: 20 mL/min — ABNORMAL LOW (ref >60)
GFR calc non Af Amer: 19 mL/min — ABNORMAL LOW (ref >60)
GFR calc non Af Amer: 17 mL/min — ABNORMAL LOW (ref >60)
GLUCOSE: 81 mg/dL (ref 65–99)
GLUCOSE: 135 mg/dL — AB (ref 65–99)
POTASSIUM: 3.1 mmol/L — AB (ref 3.5–5.1)
Phosphorus: 4.7 mg/dL — ABNORMAL HIGH (ref 2.5–4.6)
Phosphorus: 4.4 mg/dL (ref 2.5–4.6)
Potassium: 3.1 mmol/L — ABNORMAL LOW (ref 3.5–5.1)
SODIUM: 132 mmol/L — AB (ref 135–145)
SODIUM: 130 mmol/L — AB (ref 135–145)

## 2017-05-20 LAB — MAGNESIUM: Magnesium: 1.8 mg/dL (ref 1.7–2.4)

## 2017-05-20 LAB — LACTATE DEHYDROGENASE: LDH: 240 U/L — AB (ref 98–192)

## 2017-05-20 MED ORDER — TORSEMIDE 20 MG PO TABS
60.0000 mg | ORAL_TABLET | Freq: Every day | ORAL | Status: DC
Start: 2017-05-20 — End: 2017-05-21
  Administered 2017-05-20: 60 mg via ORAL
  Filled 2017-05-20: qty 3

## 2017-05-20 MED ORDER — POTASSIUM CHLORIDE CRYS ER 20 MEQ PO TBCR
20.0000 meq | EXTENDED_RELEASE_TABLET | Freq: Once | ORAL | Status: AC
  Administered 2017-05-20: 20 meq via ORAL
  Filled 2017-05-20: qty 1

## 2017-05-20 NOTE — Progress Notes (Signed)
Patient ID: David [name], male   DOB: [birth year], 52 y.o.   MRN: [id number]   Advanced Heart Failure VAD Team Note  Subjective:    David [name] is a 52 y.o. male with a PMH of ICM ('09 PCI to LAD, mRCA, '10 PCI to Lcx and '12 mRCA), HFrEF (EF ~20% for >5 years), HLD and HTN admitted 12/26 for VAD placement due to end-stage systolic HF.   - Underwent successful HM-3 LVAD placement 04/20/17. 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 CVVHD started 12/30 - Started on Meropenem 04/27/16 with + Achromobacter in tracheal aspirate. (Finished 05/07/17) - CVVHD stopped 1/10 - 1st iHD 1/12 - Tunneled HD cath placed 05/08/17 - Keflex for mild drainage from Driveline 8/91/69 switched augmentin. Completed augmentin 1/23 - Started linezolid 1/25 for ?cellulitis.   WBCs lower today, afebrile.  MAP 80s.  UOP 1215 cc, creatinine mildly higher.  INR 2.55, LDH 240.   Echo 12/31 reviewed: No significant pericardial effusion, IV septum midline to slightly to the right, RV function adequate.  LVAD INTERROGATION:  HeartMate III LVAD:  Flow 4.9  liters/min, speed 5400, power 4.1, PI 4.1.   Objective:    Vital Signs:   Temp:  [96.5 F (35.8 C)-98.5 F (36.9 C)] 98.5 F (36.9 C) (01/26 0828) Pulse Rate:  [61-106] 81 (01/26 0828) Resp:  [12-24] 20 (01/26 0828) BP: (83-117)/(59-78) 87/68 (01/26 0828) SpO2:  [94 %-99 %] 94 % (01/26 0828) Weight:  [148 lb 13 oz (67.5 kg)] 148 lb 13 oz (67.5 kg) (01/26 0418) Last BM Date: 05/19/17 Mean arterial Pressure 70s-80s  Intake/Output:   Intake/Output Summary (Last 24 hours) at 05/20/2017 0925 Last data filed at 05/20/2017 0850 Gross per 24 hour  Intake -  Output 1315 ml  Net -1315 ml   Physical Exam   Physical Exam: GENERAL: Well appearing this am. NAD.  HEENT: Normal. NECK: Supple, JVP 7-8 cm. Carotids OK.  CARDIAC:  Mechanical heart sounds with LVAD hum present.  LUNGS:  CTAB, normal effort.  ABDOMEN:  NT, ND, no HSM. No bruits or masses. +BS  LVAD  exit site: Well-healed and incorporated. Dressing dry and intact. Some erythema around driveline, no drainage. Stabilization device present and accurately applied. Driveline dressing changed daily per sterile technique. EXTREMITIES:  Warm and dry. No cyanosis, clubbing, rash, or edema.  NEUROLOGIC:  Alert & oriented x 3. Cranial nerves grossly intact. Moves all 4 extremities w/o difficulty. Affect pleasant    Telemetry   NSR 80s-90s.  Personally reviewed  Labs   Basic Metabolic Panel: Recent Labs  Lab 05/16/17 0336  05/17/17 0503  05/18/17 4503  05/18/17 1522 05/19/17 0246 05/19/17 1349 05/20/17 0649  NA 135   < > 134*   < > 136  --  133* 135 133* 132*  K 3.3*   < > 3.2*   < > 2.9*  --  3.4* 3.4* 3.3* 3.1*  CL 99*   < > 94*   < > 91*  --  97* 96* 93* 91*  CO2 27   < > 28   < > 31  --  28 28 28 28   GLUCOSE 93   < > 89   < > 75  --  114* 93 104* 81  BUN <5*   < > 11   < > 18  --  5* 10 13 16   CREATININE 2.62*   < > 3.66*   < > 3.96*  --  2.11* 2.65* 3.11* 3.44*  CALCIUM 8.0*   < > 8.6*   < > 8.7*   < > 7.7* 8.4* 8.5* 8.5*  MG 1.6*  --  1.4*  --  2.1  --   --  1.7  --  1.8  PHOS 1.9*   < > 2.7   < > 3.2  --  1.6* 2.4* 3.5 4.7*   < > = values in this interval not displayed.    Liver Function Tests: Recent Labs  Lab 05/18/17 0318 05/18/17 1522 05/19/17 0246 05/19/17 1349 05/20/17 0649  ALBUMIN 1.6* 1.6* 1.7* 1.7* 1.7*   No results for input(s): LIPASE, AMYLASE in the last 168 hours. No results for input(s): AMMONIA in the last 168 hours.  CBC: Recent Labs  Lab 05/16/17 0336 05/17/17 0503 05/18/17 0318 05/19/17 0246 05/20/17 0649  WBC 17.1* 17.6* 20.8* 24.0* 20.8*  NEUTROABS  --   --   --   --  14.4*  HGB 7.8* 8.0* 8.2* 8.7* 8.5*  HCT 25.8* 26.9* 27.0* 27.1* 27.8*  MCV 86.6 87.6 86.3 86.0 86.3  PLT 347 365 364 345 334    INR: Recent Labs  Lab 05/16/17 0336 05/17/17 0503 05/18/17 0318 05/19/17 0246 05/20/17 0649  INR 4.76* 3.81 3.33 2.99 2.55    Other  results:  Imaging   No results found.   Medications:     Scheduled Medications: . aspirin EC  81 mg Oral Daily  . atorvastatin  40 mg Oral q1800  . chlorhexidine  15 mL Mouth Rinse BID  . citalopram  5 mg Oral Daily  . darbepoetin (ARANESP) injection - DIALYSIS  200 mcg Intravenous Q Thu-HD  . feeding supplement  1 Container Oral BID BM  . ferrous [id number] C-folic acid  1 capsule Oral BID BM  . gabapentin  200 mg Oral BID  . linezolid  600 mg Oral Q12H  . mouth rinse  15 mL Mouth Rinse q12n4p  . pantoprazole  40 mg Oral Daily  . polyethylene glycol  17 g Oral Daily  . potassium chloride  20 mEq Oral Daily  . potassium chloride  20 mEq Oral Once  . torsemide  60 mg Oral Daily  . Warfarin - Pharmacist Dosing Inpatient   Does not apply q1800    Infusions: . sodium chloride Stopped (04/30/17 0800)  . ferric gluconate (FERRLECIT/NULECIT) IV Stopped (05/18/17 1106)    PRN Medications: heparin, levalbuterol, ondansetron (ZOFRAN) IV, oxyCODONE, simethicone, sodium chloride flush, sodium chloride flush   Patient Profile   David [name] is a 52 y.o. male with a PMH of ICM ('09 PCI to LAD, mRCA, '10 PCI to Lcx and '12 mRCA), HFrEF (EF ~20% for >5 years) s/p HM-III VAD placement on 12/27 due to end-stage systolic HF.   Assessment/Plan:    1. Acute on chronic systolic HF: Echo 48/01/65 with LVEF 20-25%, Mild MR, Mild/Mod MR, Severe LAE, Mildly reduced RV, PA peak pressure 56 mm Hg.  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 Extubated, initially stopped epinephrine/norepinephrine but restarted with fall in UOP and MAP.  With progressive renal failure, pulmonary edema, and hypotension, he was re-intubated on 12/30.   CVVH was begun.  12/31 was difficult day with refractory shock (MAP in 60s).  Reviewed 12/31 echo which showed no significant pericardial effusion, adequate RV function, and IV septum near mid-line. Off all pressors. Stable. He  does not look volume overloaded on exam.  - Continue aspirin 81 mg daily.  - Warfarin goal INR  2-2.5. INR 2.55.     - Decrease torsemide to 60 mg once daily, avoid over-diuresis.  2. CAD s/p PCI to mid RCA and PLOM with DES x 2 and DES to mid LAD in 1/18.   - Continue ASA and statin.  3. PAF: Remains in NSR. 4. AKI on CKD stage III: Suspect some degree of intrinsic renal disease prior to LVAD placement (baseline creatinine around 1.5) with probable ATN from intra-op/peri-op hypotension and development of vasodilatory/septic shock.  CVVHD stopped 05/04/2017. First iHD 05/06/17 without problems.Tunneled HD cath placed 05/08/17.  Vein mapping done plan for AV graft placement if no recovery.  Last HD on 1/24.  Making urine, BUN/creatinine rising but slowly.  - No HD today, continue to follow daily for needs. Not sure yet if he will improve or will need long-term HD.  5. Anemia: s/p 1 u PRBCs 12/30, 04/25/17, 1/8. Hgb stable at 8.5.  - Continue aranesp .  - Want to limit transfusion with possibility of transplant down the road. Follow closely.  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 ID: Started on cefepime empirically with septic/vasodilatory shock, PCT was 31 04/25/17.  ABX broadened to Meropenem 04/27/16 with + Achromobacter on tracheal aspirate. Finished 05/07/17 (10 days).  WBCs rising more recently, started on linezolid 1/25 for ?cellulitis around driveline.  WBCs now coming down.  - Continue linezolid per ID.  7. Elevated LFTs due to shock liver.  LFTs improved but albumin low. Suspect compromised liver synthetic function. Encouraged protein intake. 8. Pain: Stable. - Continue gabapentin 200 bid 9. Protein-calorie malnutrition - Encouraged protein intake  Length of Stay: [place], MD 05/20/2017, 9:25 AM  VAD Team --- VAD ISSUES ONLY--- Pager [phone] (7am - 7am) Advanced Heart Failure Team  Pager [phone] (M-F; 7a - 4p)  Please contact [facility] for night-coverage  after hours (4p -7a ) and weekends on [email or web address]

## 2017-05-20 NOTE — Progress Notes (Signed)
Exit site care VAD dressing removed and site care performed using sterile technique. Drive line exit site cleaned with Chlora prep applicators x 2, allowed to dry, and gauze dressing with aquacel silver strip re-applied. Exit site with partial tissue ingrowth, the velour is fully implanted at exit site. Erythema around exit site remains the same, no tenderness, drainage, foul odor, or rash noted. Drive line anchor in place and accurately applied.    Bobette Mo

## 2017-05-20 NOTE — Progress Notes (Signed)
CARDIAC REHAB PHASE I   PRE:  Rate/Rhythm: 97 sinus rhythm  BP:  Supine:   Sitting: 80doppler  Standing:    SaO2: 93% 2L  MODE:  Ambulation: 850  ft   POST:  Rate/Rhythem: 101 sinus tachycardia   BP:  Supine:   Sitting: 78doppler    Standing:    SaO2: unable to obtain  Pt ambulated in hallway x2 assist using rolling walker. Asymptomatic.  Pt took 2 standing rest breaks. Pt verbalized enjoyment of walk.  Pt in bed with battery pack in place. Pt instructed to connect to system controller when lying in bed to avoid falling asleep wearing battery pack. Pt retur ned to bed call light in reach  Pt able to disconnect from battery pack independently with 1 corrected mistake.   Nancie Neas Tresea Heine

## 2017-05-20 NOTE — Progress Notes (Signed)
[name] Progress Note    Assessment/ Plan:    89M with systolic CHF s/p LVAD 38/46  with AKI  On CKD--> ESRD, new-start HD.    1.  AKI on CKD III--> ESRD: likely due to ATN from shock s/p LVAD placement with underlying arterionephrosclerosis.  Required CRRT initially - transitioned to IHD (1/11).  S/p TDC with IR 05/08/17.  UOP good (1200 yest) but crt inc from 2.11 to 3.11 off of HD,  s/p vein mapping 05/12/17, was considering  perm access placement but plan is to table that for right now. Given good UOP and some plateau of renal function a few days after HD last week  will plan to hold HD and watch - volume if not an issue- query if dry but will defer to them.  Working on [name] to OP unit- lives in [place]- need to make sure they can take LVAD pt if he is to continue HD.  Last HD was 1/24  2.  Acute on chronic systolic CHF: s/p Heartmate III 12/27.  Advanced HF and cardiothoracic surgery following.    Coumadin has been restarted (1/14).  On torsemide 60 BID to augment UOP- maybe getting dry- will ask cards  3.  Elevated LFTs/ Hepatic dysfunction- labile INRs,  Per primary  4.  Hyponatremia: vol related,  Improving.   5.  Achromobacter pneumonia: s/p antibiotics (ended 1/13, 10 days)  6.  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 Hypokalemia- on high K bath-  will start 20 daily   8. Anemia- iron stores low, giving iron - on darbe 200 q week  9. PTH low, phos OK no binder - actually low  Subjective:    No new complaints- attempting to hold HD- ID saw for elevated WBC- started zyvox   Objective:   BP (!) 83/59 (BP Location: Left Arm)   Pulse 61   Temp 98.1 F (36.7 C) (Oral)   Resp (!) 24   Ht 5\' 5"  (1.651 m)   Wt 67.5 kg (148 lb 13 oz)   SpO2 94%   BMI 24.76 kg/m   Intake/Output Summary (Last 24 hours) at 05/20/2017 0810 Last data filed at 05/20/2017 0429 Gross per 24 hour  Intake 360 ml  Output 1215 ml  Net -855 ml   Weight change: -0.8 kg (-12.2  oz)  Physical Exam: [id number] gentleman, NAD, lying in bed HEENT: some JVD upright CVS: mechanical hum of LVAD Resp: normal WOB today, clear anteriorly, some muffled bilateral bses Abd: + drive line LUQ, dressed Ext: no edema ACCESS: R IJ TDC In place NEURO:  AAO x 3 and able to appropriately answer questions  Imaging: No results found.  Labs: BMET Recent Labs  Lab 05/16/17 2010 05/17/17 0503 05/17/17 1522 05/18/17 0318 05/18/17 0653 05/18/17 1522 05/19/17 0246 05/19/17 1349  NA 133* 134* 138 136  --  133* 135 133*  K 3.3* 3.2* 3.5 2.9*  --  3.4* 3.4* 3.3*  CL 97* 94* 96* 91*  --  97* 96* 93*  CO2 27 28 32 31  --  28 28 28   GLUCOSE 104* 89 124* 75  --  114* 93 104*  BUN 9 11 14 18   --  5* 10 13  CREATININE 3.49* 3.66* 4.19* 3.96*  --  2.11* 2.65* 3.11*  CALCIUM 8.2* 8.6* 8.6* 8.7* 8.6* 7.7* 8.4* 8.5*  PHOS 2.7 2.7 2.7 3.2  --  1.6* 2.4* 3.5   CBC Recent Labs  Lab 05/16/17 0336 05/17/17  0503 05/18/17 0318 05/19/17 0246  WBC 17.1* 17.6* 20.8* 24.0*  HGB 7.8* 8.0* 8.2* 8.7*  HCT 25.8* 26.9* 27.0* 27.1*  MCV 86.6 87.6 86.3 86.0  PLT 347 365 364 345    Medications:    . aspirin EC  81 mg Oral Daily  . atorvastatin  40 mg Oral q1800  . chlorhexidine  15 mL Mouth Rinse BID  . citalopram  5 mg Oral Daily  . darbepoetin (ARANESP) injection - DIALYSIS  200 mcg Intravenous Q Thu-HD  . feeding supplement  1 Container Oral BID BM  . ferrous [id number] C-folic acid  1 capsule Oral BID BM  . gabapentin  200 mg Oral BID  . linezolid  600 mg Oral Q12H  . mouth rinse  15 mL Mouth Rinse q12n4p  . pantoprazole  40 mg Oral Daily  . polyethylene glycol  17 g Oral Daily  . potassium chloride  20 mEq Oral Daily  . torsemide  60 mg Oral BID  . Warfarin - Pharmacist Dosing Inpatient   Does not apply q1800      Asa Fath A  05/20/2017, 8:10 AM

## 2017-05-20 NOTE — Progress Notes (Signed)
30 Days Post-Op Procedure(s) (LRB): INSERTION OF IMPLANTABLE LEFT VENTRICULAR ASSIST DEVICE, Aortic Valve repair (N/A) TRANSESOPHAGEAL ECHOCARDIOGRAM (TEE) (N/A) AORTIC VALVE REPAIR (N/A) Subjective: Patient feels well States he passed 1 L of urine yesterday No drainage around driveline dressing after Zyvox started VAD parameters satisfactory White count slightly improved today  Objective: Vital signs in last 24 hours: Temp:  [96.5 F (35.8 C)-99.2 F (37.3 C)] 98.8 F (37.1 C) (01/26 1620) Pulse Rate:  [61-111] 67 (01/26 1620) Cardiac Rhythm: Heart block (01/26 0843) Resp:  [18-24] 22 (01/26 1620) BP: (83-99)/(59-79) 99/79 (01/26 1620) SpO2:  [94 %-99 %] 99 % (01/26 1620) Weight:  [148 lb 13 oz (67.5 kg)] 148 lb 13 oz (67.5 kg) (01/26 0418)  Hemodynamic parameters for last 24 hours:    Intake/Output from previous day: 01/25 0701 - 01/26 0700 In: 360 [P.O.:360] Out: 1215 [Urine:1215] Intake/Output this shift: Total I/O In: -  Out: 875 [Urine:875]  Comfortable lying in bed Breath sounds clear Normal VAD home No tenderness around driveline exit site  Lab Results: Recent Labs    05/19/17 0246 05/20/17 0649  WBC 24.0* 20.8*  HGB 8.7* 8.5*  HCT 27.1* 27.8*  PLT 345 334   BMET:  Recent Labs    05/20/17 0649 05/20/17 1507  NA 132* 130*  K 3.1* 3.1*  CL 91* 91*  CO2 28 26  GLUCOSE 81 135*  BUN 16 18  CREATININE 3.44* 3.68*  CALCIUM 8.5* 8.3*    PT/INR:  Recent Labs    05/20/17 0649  LABPROT 27.2*  INR 2.55   ABG    Component Value Date/Time   PHART 7.477 (H) 04/27/2017 0351   HCO3 23.6 04/27/2017 0351   TCO2 25 04/27/2017 0351   ACIDBASEDEF 1.0 04/24/2017 0926   O2SAT 68.6 05/09/2017 0359   CBG (last 3)  No results for input(s): GLUCAP in the last 72 hours.  Assessment/Plan: S/P Procedure(s) (LRB): INSERTION OF IMPLANTABLE LEFT VENTRICULAR ASSIST DEVICE, Aortic Valve repair (N/A) TRANSESOPHAGEAL ECHOCARDIOGRAM (TEE) (N/A) AORTIC VALVE  REPAIR (N/A) BUN-creatinine remained stable-1.2 L yesterday and 900 cc so far today   LOS: 31 days    [name] 05/20/2017

## 2017-05-20 NOTE — Progress Notes (Signed)
Subjective: " I feel great"   Antibiotics:  Anti-infectives (From admission, onward)   Start     Dose/Rate Route Frequency Ordered Stop   05/20/17 0000  levofloxacin (LEVAQUIN) IVPB 500 mg  Status:  Discontinued     500 mg 100 mL/hr over 60 Minutes Intravenous Every 48 hours 05/18/17 0906 05/19/17 1139   05/19/17 1145  linezolid (ZYVOX) tablet 600 mg     600 mg Oral Every 12 hours 05/19/17 1139 05/24/17 0959   05/18/17 0915  levofloxacin (LEVAQUIN) IVPB 750 mg  Status:  Discontinued     750 mg 100 mL/hr over 90 Minutes Intravenous  Once 05/18/17 0906 05/19/17 1139   05/17/17 1200  vancomycin (VANCOCIN) IVPB 750 mg/150 ml premix  Status:  Discontinued     750 mg 150 mL/hr over 60 Minutes Intravenous Every M-W-F (Hemodialysis) 05/15/17 0948 05/15/17 1442   05/15/17 2200  cephALEXin (KEFLEX) capsule 500 mg  Status:  Discontinued     500 mg Oral Every 12 hours 05/15/17 1442 05/15/17 1518   05/15/17 2200  amoxicillin-clavulanate (AUGMENTIN) 500-125 MG per tablet 500 mg  Status:  Discontinued     1 tablet Oral 2 times daily 05/15/17 1518 05/15/17 1522   05/15/17 2200  amoxicillin-clavulanate (AUGMENTIN) 500-125 MG per tablet 500 mg     1 tablet Oral Daily 05/15/17 1522 05/18/17 0933   05/15/17 1200  vancomycin (VANCOCIN) 1,500 mg in sodium chloride 0.9 % 500 mL IVPB  Status:  Discontinued     1,500 mg 250 mL/hr over 120 Minutes Intravenous  Once 05/15/17 0948 05/15/17 1722   05/11/17 2200  cephALEXin (KEFLEX) capsule 500 mg  Status:  Discontinued     500 mg Oral Every 12 hours 05/11/17 1737 05/15/17 0948   05/08/17 1045  ceFAZolin (ANCEF) IVPB 2g/100 mL premix     2 g 200 mL/hr over 30 Minutes Intravenous  Once 05/08/17 1038 05/08/17 1200   05/08/17 1033  ceFAZolin (ANCEF) 2-4 GM/100ML-% IVPB    Comments:  Leak, Brandi   : cabinet override      05/08/17 1033 05/08/17 1200   05/06/17 2000  meropenem (MERREM) 500 mg in sodium chloride 0.9 % 50 mL IVPB  Status:  Discontinued      500 mg 100 mL/hr over 30 Minutes Intravenous Every 24 hours 05/05/17 1039 05/05/17 1407   05/05/17 2000  meropenem (MERREM) 1 g in sodium chloride 0.9 % 100 mL IVPB  Status:  Discontinued     1 g 200 mL/hr over 30 Minutes Intravenous Every 24 hours 05/04/17 1528 05/05/17 0622   05/05/17 2000  meropenem (MERREM) 500 mg in sodium chloride 0.9 % 50 mL IVPB  Status:  Discontinued     500 mg 100 mL/hr over 30 Minutes Intravenous Every 24 hours 05/05/17 0622 05/05/17 1039   05/05/17 2000  meropenem (MERREM) 500 mg in sodium chloride 0.9 % 50 mL IVPB     500 mg 100 mL/hr over 30 Minutes Intravenous Every 24 hours 05/05/17 1407 05/06/17 2155   04/27/17 1700  meropenem (MERREM) 1 g in sodium chloride 0.9 % 100 mL IVPB  Status:  Discontinued     1 g 200 mL/hr over 30 Minutes Intravenous Every 12 hours 04/27/17 1520 05/04/17 1528   04/23/17 2330  ceFEPIme (MAXIPIME) 2 g in dextrose 5 % 50 mL IVPB  Status:  Discontinued     2 g 100 mL/hr over 30 Minutes Intravenous Every 12 hours 04/23/17 1122  04/27/17 1520   04/23/17 0900  ceFEPIme (MAXIPIME) 2 g in dextrose 5 % 50 mL IVPB     2 g 100 mL/hr over 30 Minutes Intravenous  Once 04/23/17 0804 04/23/17 0935   04/21/17 0800  rifampin (RIFADIN) capsule 600 mg     600 mg Oral  Once 04/20/17 1416 04/21/17 0751   04/21/17 0600  fluconazole (DIFLUCAN) IVPB 400 mg     400 mg 100 mL/hr over 120 Minutes Intravenous  Once 04/20/17 1416 04/21/17 0732   04/20/17 2300  cefUROXime (ZINACEF) 1.5 g in dextrose 5 % 50 mL IVPB     1.5 g 100 mL/hr over 30 Minutes Intravenous Every 12 hours 04/20/17 1416 04/22/17 1454   04/20/17 2215  vancomycin (VANCOCIN) IVPB 1000 mg/200 mL premix  Status:  Discontinued     1,000 mg 200 mL/hr over 60 Minutes Intravenous Every 12 hours 04/20/17 1416 04/20/17 1457   04/20/17 2000  vancomycin (VANCOCIN) IVPB 750 mg/150 ml premix  Status:  Discontinued     750 mg 150 mL/hr over 60 Minutes Intravenous Every 12 hours 04/20/17 1457  04/22/17 0732   04/20/17 0600  rifampin (RIFADIN) 600 mg in sodium chloride 0.9 % 100 mL IVPB  Status:  Discontinued     600 mg 200 mL/hr over 30 Minutes Intravenous On call to O.R. 04/19/17 1728 04/20/17 1344   04/20/17 0400  vancomycin (VANCOCIN) 1,250 mg in sodium chloride 0.9 % 250 mL IVPB     1,250 mg 166.7 mL/hr over 90 Minutes Intravenous To Surgery 04/19/17 1754 04/20/17 1400   04/20/17 0400  cefUROXime (ZINACEF) 1.5 g in dextrose 5 % 50 mL IVPB     1.5 g 100 mL/hr over 30 Minutes Intravenous To Surgery 04/19/17 1754 04/20/17 1517   04/20/17 0400  cefUROXime (ZINACEF) 750 mg in dextrose 5 % 50 mL IVPB  Status:  Discontinued     750 mg 100 mL/hr over 30 Minutes Intravenous To Surgery 04/19/17 1754 04/20/17 1344   04/20/17 0400  fluconazole (DIFLUCAN) IVPB 400 mg     400 mg 100 mL/hr over 120 Minutes Intravenous To Surgery 04/19/17 1754 04/20/17 0815   04/20/17 0400  rifampin (RIFADIN) 600 mg in sodium chloride 0.9 % 100 mL IVPB     600 mg 200 mL/hr over 30 Minutes Intravenous To Surgery 04/19/17 1754 04/20/17 1521   04/20/17 0400  vancomycin (VANCOCIN) powder 1,000 mg     1,000 mg Other To Surgery 04/19/17 1754 04/20/17 1036      Medications: Scheduled Meds: . aspirin EC  81 mg Oral Daily  . atorvastatin  40 mg Oral q1800  . chlorhexidine  15 mL Mouth Rinse BID  . citalopram  5 mg Oral Daily  . darbepoetin (ARANESP) injection - DIALYSIS  200 mcg Intravenous Q Thu-HD  . feeding supplement  1 Container Oral BID BM  . ferrous [id number] C-folic acid  1 capsule Oral BID BM  . gabapentin  200 mg Oral BID  . linezolid  600 mg Oral Q12H  . mouth rinse  15 mL Mouth Rinse q12n4p  . pantoprazole  40 mg Oral Daily  . polyethylene glycol  17 g Oral Daily  . potassium chloride  20 mEq Oral Daily  . torsemide  60 mg Oral Daily  . Warfarin - Pharmacist Dosing Inpatient   Does not apply q1800   Continuous Infusions: . sodium chloride Stopped (04/30/17 0800)  . ferric  gluconate (FERRLECIT/NULECIT) IV Stopped (05/18/17 1106)   PRN Meds:.heparin,  levalbuterol, ondansetron (ZOFRAN) IV, oxyCODONE, simethicone, sodium chloride flush, sodium chloride flush    Objective: Weight change: -12.2 oz (-0.8 kg)  Intake/Output Summary (Last 24 hours) at 05/20/2017 1139 Last data filed at 05/20/2017 0850 Gross per 24 hour  Intake -  Output 1315 ml  Net -1315 ml   Blood pressure (!) 87/68, pulse 81, temperature 98.5 F (36.9 C), temperature source Oral, resp. rate 20, height 5\' 5"  (1.651 m), weight 148 lb 13 oz (67.5 kg), SpO2 94 %. Temp:  [96.5 F (35.8 C)-98.5 F (36.9 C)] 98.5 F (36.9 C) (01/26 0828) Pulse Rate:  [61-106] 81 (01/26 0828) Resp:  [12-24] 20 (01/26 0828) BP: (83-117)/(59-78) 87/68 (01/26 0828) SpO2:  [94 %-99 %] 94 % (01/26 0828) Weight:  [148 lb 13 oz (67.5 kg)] 148 lb 13 oz (67.5 kg) (01/26 0418)  Physical Exam: General: Alert and awake, oriented x3, not in any acute distress. HEENT: anicteric sclera, pupils reactive to light and accommodation, EOMI CVS LVAD audible Chest:no wheezing, resp distress Abdomen: soft  Nondistended, Skin: drive line site bandaged Neuro: nonfocal  CBC:  CBC Latest Ref Rng & Units 05/20/2017 05/19/2017 05/18/2017  WBC 4.0 - 10.5 K/uL 20.8(H) 24.0(H) 20.8(H)  Hemoglobin 13.0 - 17.0 g/dL 8.5(L) 8.7(L) 8.2(L)  Hematocrit 39.0 - 52.0 % 27.8(L) 27.1(L) 27.0(L)  Platelets 150 - 400 K/uL 334 345 364      BMET Recent Labs    05/19/17 1349 05/20/17 0649  NA 133* 132*  K 3.3* 3.1*  CL 93* 91*  CO2 28 28  GLUCOSE 104* 81  BUN 13 16  CREATININE 3.11* 3.44*  CALCIUM 8.5* 8.5*     Liver Panel  Recent Labs    05/19/17 1349 05/20/17 0649  ALBUMIN 1.7* 1.7*       Sedimentation Rate No results for input(s): ESRSEDRATE in the last 72 hours. C-Reactive Protein No results for input(s): CRP in the last 72 hours.  Micro Results: Recent Results (from the past 720 hour(s))  Culture, blood (routine x  2)     Status: None   Collection Time: 04/22/17 11:25 AM  Result Value Ref Range Status   Specimen Description BLOOD RIGHT ARM  Final   Special Requests IN PEDIATRIC BOTTLE Blood Culture adequate volume  Final   Culture NO GROWTH 5 DAYS  Final   Report Status 04/27/2017 FINAL  Final  Culture, blood (routine x 2)     Status: None   Collection Time: 04/22/17 11:36 AM  Result Value Ref Range Status   Specimen Description BLOOD RIGHT HAND  Final   Special Requests IN PEDIATRIC BOTTLE Blood Culture adequate volume  Final   Culture NO GROWTH 5 DAYS  Final   Report Status 04/27/2017 FINAL  Final  Culture, respiratory (NON-Expectorated)     Status: None   Collection Time: 04/24/17  8:16 AM  Result Value Ref Range Status   Specimen Description TRACHEAL ASPIRATE  Final   Special Requests NONE  Final   Gram Stain   Final    MODERATE WBC PRESENT,BOTH PMN AND MONONUCLEAR NO ORGANISMS SEEN    Culture FEW ACHROMOBACTER SPECIES  Final   Report Status 04/27/2017 FINAL  Final   Organism ID, Bacteria ACHROMOBACTER SPECIES  Final      Susceptibility   Achromobacter species - MIC*    CEFEPIME >=64 RESISTANT Resistant     CEFAZOLIN >=64 RESISTANT Resistant     GENTAMICIN 4 SENSITIVE Sensitive     CIPROFLOXACIN 2 INTERMEDIATE Intermediate  IMIPENEM 1 SENSITIVE Sensitive     TRIMETH/SULFA <=20 SENSITIVE Sensitive     * FEW ACHROMOBACTER SPECIES  Urine Culture     Status: None   Collection Time: 04/25/17 12:38 PM  Result Value Ref Range Status   Specimen Description URINE, CATHETERIZED  Final   Special Requests Normal  Final   Culture NO GROWTH  Final   Report Status 04/26/2017 FINAL  Final    Studies/Results: No results found.    Assessment/Plan:  INTERVAL HISTORY: WBC fwiw trended down   Active Problems:   Acute on chronic systolic (congestive) heart failure (HCC)   LVAD (left ventricular assist device) present ([place])   Cardiogenic shock (HCC)    [name] is a 52 y.o.  male with  LVAD and now now admitted and HD dependent.  He had worsening leukocytosis since 05/10/17 after his HD cath placement steadily 11.2 > 24. In early post-op period he was treated for HCAP with resolution of leukocytosis and clinical improvement. He has had some trauma to the driveline site with worsening drainage from the exit site. This has been treated with Keflex/Augmentin x 1 week. We changed him to zyvox to treat for possible cellulitis around drive line and his WBC has trended down  #1 Possible superficial infection near driveline site:  Continue with zyvox  #2 ESRD on HD: this is a HIGH risk situation for someone with a cardiac device as there is high risk for bacteremias         LOS: 31 days   Alcide Evener 05/20/2017, 11:39 AM

## 2017-05-20 NOTE — Progress Notes (Signed)
[place] for Coumadin Indication: LVAD  Allergies  Allergen Reactions  . Plavix [Clopidogrel Bisulfate] Hives   Patient Measurements: Height: 5\' 5"  (165.1 cm) Weight: 148 lb 13 oz (67.5 kg) IBW/kg (Calculated) : 61.5  Vital Signs: Temp: 98.5 F (36.9 C) (01/26 0828) Temp Source: Oral (01/26 0828) BP: 87/68 (01/26 0828) Pulse Rate: 81 (01/26 0828)  Labs: Recent Labs    05/18/17 0318  05/19/17 0246 05/19/17 1349 05/20/17 0649  HGB 8.2*  --  8.7*  --  8.5*  HCT 27.0*  --  27.1*  --  27.8*  PLT 364  --  345  --  334  LABPROT 33.6*  --  30.8*  --  27.2*  INR 3.33  --  2.99  --  2.55  CREATININE 3.96*   < > 2.65* 3.11* 3.44*   < > = values in this interval not displayed.   Estimated Creatinine Clearance: 21.6 mL/min (A) (by C-G formula based on SCr of 3.44 mg/dL (H)).  Assessment: 52 yo male s/p LVAD implantation 12/27. Warfarin on hold with plan for HD graft next week, will need heparin bridge once INR <2.0. INR now down to 2.55 this morning, CBC stable.    Goal of Therapy:  INR 2-2.5 Monitor platelets by anticoagulation protocol: Yes   Plan: -Hold warfarin again tonight -Daily PT/INR  Arrie Senate, PharmD, BCPS PGY-2 Cardiology Pharmacy Resident Pager: [phone] 05/20/2017

## 2017-05-21 DIAGNOSIS — T827XXA Infection and inflammatory reaction due to other cardiac and vascular devices, implants and grafts, initial encounter: Secondary | ICD-10-CM

## 2017-05-21 LAB — MAGNESIUM: MAGNESIUM: 1.6 mg/dL — AB (ref 1.7–2.4)

## 2017-05-21 LAB — RENAL FUNCTION PANEL
ALBUMIN: 1.6 g/dL — AB (ref 3.5–5.0)
ALBUMIN: 1.7 g/dL — AB (ref 3.5–5.0)
Anion gap: 14 (ref 5–15)
Anion gap: 12 (ref 5–15)
BUN: 20 mg/dL (ref 6–20)
BUN: 22 mg/dL — AB (ref 6–20)
CALCIUM: 8.5 mg/dL — AB (ref 8.9–10.3)
CHLORIDE: 90 mmol/L — AB (ref 101–111)
CO2: 28 mmol/L (ref 22–32)
CO2: 30 mmol/L (ref 22–32)
CREATININE: 3.9 mg/dL — AB (ref 0.61–1.24)
CREATININE: 3.91 mg/dL — AB (ref 0.61–1.24)
Calcium: 8.5 mg/dL — ABNORMAL LOW (ref 8.9–10.3)
Chloride: 91 mmol/L — ABNORMAL LOW (ref 101–111)
GFR calc Af Amer: 19 mL/min — ABNORMAL LOW (ref >60)
GFR, EST AFRICAN AMERICAN: 19 mL/min — AB (ref >60)
GFR, EST NON AFRICAN AMERICAN: 16 mL/min — AB (ref >60)
GFR, EST NON AFRICAN AMERICAN: 16 mL/min — AB (ref >60)
GLUCOSE: 99 mg/dL (ref 65–99)
Glucose, Bld: 81 mg/dL (ref 65–99)
PHOSPHORUS: 6.4 mg/dL — AB (ref 2.5–4.6)
PHOSPHORUS: 6.2 mg/dL — AB (ref 2.5–4.6)
POTASSIUM: 3.5 mmol/L (ref 3.5–5.1)
Potassium: 3.9 mmol/L (ref 3.5–5.1)
SODIUM: 133 mmol/L — AB (ref 135–145)
Sodium: 132 mmol/L — ABNORMAL LOW (ref 135–145)

## 2017-05-21 LAB — CBC
HCT: 26.6 % — ABNORMAL LOW (ref 39.0–52.0)
Hemoglobin: 8.5 g/dL — ABNORMAL LOW (ref 13.0–17.0)
MCH: 27.5 pg (ref 26.0–34.0)
MCHC: 32 g/dL (ref 30.0–36.0)
MCV: 86.1 fL (ref 78.0–100.0)
PLATELETS: 317 10*3/uL (ref 150–400)
RBC: 3.09 MIL/uL — ABNORMAL LOW (ref 4.22–5.81)
RDW: 21.1 % — AB (ref 11.5–15.5)
WBC: 20.6 10*3/uL — AB (ref 4.0–10.5)

## 2017-05-21 LAB — PROTIME-INR
INR: 2.38
PROTHROMBIN TIME: 25.8 s — AB (ref 11.4–15.2)

## 2017-05-21 LAB — LACTATE DEHYDROGENASE: LDH: 209 U/L — ABNORMAL HIGH (ref 98–192)

## 2017-05-21 MED ORDER — SEVELAMER CARBONATE 800 MG PO TABS
800.0000 mg | ORAL_TABLET | Freq: Three times a day (TID) | ORAL | Status: DC
  Administered 2017-05-21 – 2017-05-27 (×19): 800 mg via ORAL
  Filled 2017-05-21 (×19): qty 1

## 2017-05-21 MED ORDER — TORSEMIDE 20 MG PO TABS
40.0000 mg | ORAL_TABLET | Freq: Every day | ORAL | Status: DC
  Administered 2017-05-21 – 2017-05-23 (×3): 40 mg via ORAL
  Filled 2017-05-21 (×3): qty 2

## 2017-05-21 NOTE — Progress Notes (Signed)
[place] for Coumadin Indication: LVAD  Allergies  Allergen Reactions  . Plavix [Clopidogrel Bisulfate] Hives   Patient Measurements: Height: 5\' 5"  (165.1 cm) Weight: 148 lb 13 oz (67.5 kg) IBW/kg (Calculated) : 61.5  Vital Signs: Temp: 97.7 F (36.5 C) (01/27 0800) Temp Source: Oral (01/27 0800) BP: 93/79 (01/27 0800) Pulse Rate: 59 (01/27 0800)  Labs: Recent Labs    05/19/17 0246  05/20/17 0649 05/20/17 1507 05/21/17 0458  HGB 8.7*  --  8.5*  --  8.5*  HCT 27.1*  --  27.8*  --  26.6*  PLT 345  --  334  --  317  LABPROT 30.8*  --  27.2*  --  25.8*  INR 2.99  --  2.55  --  2.38  CREATININE 2.65*   < > 3.44* 3.68* 3.90*   < > = values in this interval not displayed.   Estimated Creatinine Clearance: 19.1 mL/min (A) (by C-G formula based on SCr of 3.9 mg/dL (H)).  Assessment: 52 yo male s/p LVAD implantation 12/27. Warfarin on hold with plan for possible HD graft next week, will need heparin bridge once INR <2.0. INR now down to 2.38 this morning, CBC stable.    Goal of Therapy:  INR 2-2.5 Monitor platelets by anticoagulation protocol: Yes   Plan: -Hold warfarin again tonight -F/u plans, if no HD cath, then could resume warfarin. -Daily PT/INR  [name], BCPS  Clinical Pharmacist Pager [phone]  05/21/2017 8:46 AM

## 2017-05-21 NOTE — Progress Notes (Signed)
[facility] [name] Progress Note    Assessment/ Plan:    89P with systolic CHF s/p LVAD 91/50  with AKI  On CKD--> ESRD, new-start HD.    1.  AKI on CKD III--> ESRD: likely due to ATN from shock s/p LVAD placement with underlying arterionephrosclerosis.  Required CRRT initially - transitioned to IHD (1/11).  S/p TDC with IR 05/08/17.  UOP good (1200 yest) but crt inc from 2.11 -> 3.11->3.4->3.6->3.9 off of HD,  s/p vein mapping 05/12/17, was considering  perm access placement but plan is to table that for right now. Given good UOP and some plateau of renal function a few days after HD last week  will plan to hold HD and watch-  Last HD here was 1/24 (Thursday)  - volume if not an issue- torsemide has been decreased.  Working on [name] to OP unit- lives in [place]- need to make sure they can take LVAD pt if he is to continue HD.  No HD needs today    2.  Acute on chronic systolic CHF: s/p Heartmate III 12/27.  Advanced HF and cardiothoracic surgery following.    Coumadin has been restarted (1/14).  torsemide 60 BID has been decreased to 60 per day   3.  Elevated LFTs/ Hepatic dysfunction- labile INRs,  Per primary  4.  Hyponatremia: vol related,  Improving.   5.  Driveline drainage: On zyvox now per ID   6. Hypokalemia- on high K bath-  On supp  20 daily   7. Anemia- iron stores low, giving iron - on darbe 200 q week  8. PTH low, phos OK no binder - has increased off of HD- corrected calcium high- start renvela   Subjective:    No new complaints- attempting to hold HD-    Objective:   BP 93/79 (BP Location: Left Arm)   Pulse (!) 59   Temp 97.7 F (36.5 C) (Oral)   Resp 15   Ht 5\' 5"  (1.651 m)   Wt 67.5 kg (148 lb 13 oz)   SpO2 (!) 84%   BMI 24.76 kg/m   Intake/Output Summary (Last 24 hours) at 05/21/2017 0844 Last data filed at 05/21/2017 0804 Gross per 24 hour  Intake -  Output 1700 ml  Net -1700 ml   Weight change:   Physical Exam: [id number] gentleman, NAD, lying  in bed HEENT: some JVD upright CVS: mechanical hum of LVAD Resp: normal WOB today, clear anteriorly, some muffled bilateral bses Abd: + drive line LUQ, dressed Ext: no edema ACCESS: R IJ TDC In place NEURO:  AAO x 3 and able to appropriately answer questions  Imaging: No results found.  Labs: BMET Recent Labs  Lab 05/18/17 0318 05/18/17 0653 05/18/17 1522 05/19/17 0246 05/19/17 1349 05/20/17 0649 05/20/17 1507 05/21/17 0458  NA 136  --  133* 135 133* 132* 130* 132*  K 2.9*  --  3.4* 3.4* 3.3* 3.1* 3.1* 3.5  CL 91*  --  97* 96* 93* 91* 91* 90*  CO2 31  --  28 28 28 28 26 28   GLUCOSE 75  --  114* 93 104* 81 135* 81  BUN 18  --  5* 10 13 16 18 20   CREATININE 3.96*  --  2.11* 2.65* 3.11* 3.44* 3.68* 3.90*  CALCIUM 8.7* 8.6* 7.7* 8.4* 8.5* 8.5* 8.3* 8.5*  PHOS 3.2  --  1.6* 2.4* 3.5 4.7* 4.4 6.4*   CBC Recent Labs  Lab 05/18/17 0318 05/19/17 0246 05/20/17 0649 05/21/17  0458  WBC 20.8* 24.0* 20.8* 20.6*  NEUTROABS  --   --  14.4*  --   HGB 8.2* 8.7* 8.5* 8.5*  HCT 27.0* 27.1* 27.8* 26.6*  MCV 86.3 86.0 86.3 86.1  PLT 364 345 334 317    Medications:    . aspirin EC  81 mg Oral Daily  . atorvastatin  40 mg Oral q1800  . chlorhexidine  15 mL Mouth Rinse BID  . citalopram  5 mg Oral Daily  . darbepoetin (ARANESP) injection - DIALYSIS  200 mcg Intravenous Q Thu-HD  . feeding supplement  1 Container Oral BID BM  . ferrous [id number] C-folic acid  1 capsule Oral BID BM  . gabapentin  200 mg Oral BID  . linezolid  600 mg Oral Q12H  . mouth rinse  15 mL Mouth Rinse q12n4p  . pantoprazole  40 mg Oral Daily  . polyethylene glycol  17 g Oral Daily  . potassium chloride  20 mEq Oral Daily  . torsemide  60 mg Oral Daily  . Warfarin - Pharmacist Dosing Inpatient   Does not apply q1800      Jaamal Farooqui A  05/21/2017, 8:44 AM

## 2017-05-21 NOTE — Progress Notes (Signed)
Wife performed LVAD dressing change with patient lying in bed.

## 2017-05-21 NOTE — Progress Notes (Signed)
Patient ID: David [name], male   DOB: 21-Feb-1966, 52 y.o.   MRN: [id number]   Advanced Heart Failure VAD Team Note  Subjective:    David [name] is a 52 y.o. male with a PMH of ICM ('09 PCI to LAD, mRCA, '10 PCI to Lcx and '12 mRCA), HFrEF (EF ~20% for >5 years), HLD and HTN admitted 12/26 for VAD placement due to end-stage systolic HF.   - Underwent successful HM-3 LVAD placement 04/20/17. 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 CVVHD started 12/30 - Started on Meropenem 04/27/16 with + Achromobacter in tracheal aspirate. (Finished 05/07/17) - CVVHD stopped 1/10 - 1st iHD 1/12 - Tunneled HD cath placed 05/08/17 - Keflex for mild drainage from Driveline 1/60/73 switched augmentin. Completed augmentin 1/23 - Started linezolid 1/25 for ?cellulitis.   WBCs stable at 20K, afebrile.  MAP 80s.  UOP 1275 cc, creatinine mildly higher at 3.9.  INR 2.38, LDH 209.  Feels good, walking in hall without difficulty, good appetite.   Echo 12/31 reviewed: No significant pericardial effusion, IV septum midline to slightly to the right, RV function adequate.  LVAD INTERROGATION:  HeartMate III LVAD:  Flow 4.9  liters/min, speed 5400, power 4.1, PI 4.2. 10 PI events/24 hrs   Objective:    Vital Signs:   Temp:  [97.7 F (36.5 C)-99.2 F (37.3 C)] 97.7 F (36.5 C) (01/27 0800) Pulse Rate:  [59-111] 59 (01/27 0800) Resp:  [12-22] 15 (01/27 0800) BP: (92-99)/(59-81) 93/79 (01/27 0800) SpO2:  [84 %-99 %] 84 % (01/27 0800) Last BM Date: 05/19/17 Mean arterial Pressure 70s-80s  Intake/Output:   Intake/Output Summary (Last 24 hours) at 05/21/2017 0858 Last data filed at 05/21/2017 0804 Gross per 24 hour  Intake -  Output 1300 ml  Net -1300 ml   Physical Exam  MAP 80s Physical Exam: GENERAL: Well appearing this am. NAD.  HEENT: Normal. NECK: Supple, JVP 7-8 cm. Carotids OK.  CARDIAC:  Mechanical heart sounds with LVAD hum present.  LUNGS:  CTAB, normal effort.  ABDOMEN:  NT, ND, no HSM. No  bruits or masses. +BS  LVAD exit site: Well-healed and incorporated. Dressing dry and intact. Some erythema around driveline, no drainage.. Stabilization device present and accurately applied. Driveline dressing changed daily per sterile technique. EXTREMITIES:  Warm and dry. No cyanosis, clubbing, rash, or edema.  NEUROLOGIC:  Alert & oriented x 3. Cranial nerves grossly intact. Moves all 4 extremities w/o difficulty. Affect pleasant    Telemetry   NSR 80s.  Personally reviewed  Labs   Basic Metabolic Panel: Recent Labs  Lab 05/17/17 0503  05/18/17 0318  05/19/17 0246 05/19/17 1349 05/20/17 0649 05/20/17 1507 05/21/17 0458  NA 134*   < > 136   < > 135 133* 132* 130* 132*  K 3.2*   < > 2.9*   < > 3.4* 3.3* 3.1* 3.1* 3.5  CL 94*   < > 91*   < > 96* 93* 91* 91* 90*  CO2 28   < > 31   < > 28 28 28 26 28   GLUCOSE 89   < > 75   < > 93 104* 81 135* 81  BUN 11   < > 18   < > 10 13 16 18 20   CREATININE 3.66*   < > 3.96*   < > 2.65* 3.11* 3.44* 3.68* 3.90*  CALCIUM 8.6*   < > 8.7*   < > 8.4* 8.5* 8.5* 8.3* 8.5*  MG 1.4*  --  2.1  --  1.7  --  1.8  --  1.6*  PHOS 2.7   < > 3.2   < > 2.4* 3.5 4.7* 4.4 6.4*   < > = values in this interval not displayed.    Liver Function Tests: Recent Labs  Lab 05/19/17 0246 05/19/17 1349 05/20/17 0649 05/20/17 1507 05/21/17 0458  ALBUMIN 1.7* 1.7* 1.7* 1.7* 1.6*   No results for input(s): LIPASE, AMYLASE in the last 168 hours. No results for input(s): AMMONIA in the last 168 hours.  CBC: Recent Labs  Lab 05/17/17 0503 05/18/17 0318 05/19/17 0246 05/20/17 0649 05/21/17 0458  WBC 17.6* 20.8* 24.0* 20.8* 20.6*  NEUTROABS  --   --   --  14.4*  --   HGB 8.0* 8.2* 8.7* 8.5* 8.5*  HCT 26.9* 27.0* 27.1* 27.8* 26.6*  MCV 87.6 86.3 86.0 86.3 86.1  PLT 365 364 345 334 317    INR: Recent Labs  Lab 05/17/17 0503 05/18/17 0318 05/19/17 0246 05/20/17 0649 05/21/17 0458  INR 3.81 3.33 2.99 2.55 2.38    Other results:  Imaging   No  results found.   Medications:     Scheduled Medications: . aspirin EC  81 mg Oral Daily  . atorvastatin  40 mg Oral q1800  . chlorhexidine  15 mL Mouth Rinse BID  . citalopram  5 mg Oral Daily  . darbepoetin (ARANESP) injection - DIALYSIS  200 mcg Intravenous Q Thu-HD  . feeding supplement  1 Container Oral BID BM  . ferrous [id number] C-folic acid  1 capsule Oral BID BM  . gabapentin  200 mg Oral BID  . linezolid  600 mg Oral Q12H  . mouth rinse  15 mL Mouth Rinse q12n4p  . pantoprazole  40 mg Oral Daily  . polyethylene glycol  17 g Oral Daily  . potassium chloride  20 mEq Oral Daily  . sevelamer carbonate  800 mg Oral TID WC  . torsemide  60 mg Oral Daily  . Warfarin - Pharmacist Dosing Inpatient   Does not apply q1800    Infusions: . sodium chloride Stopped (04/30/17 0800)  . ferric gluconate (FERRLECIT/NULECIT) IV Stopped (05/18/17 1106)    PRN Medications: heparin, levalbuterol, ondansetron (ZOFRAN) IV, oxyCODONE, simethicone, sodium chloride flush, sodium chloride flush   Patient Profile   David [name] is a 52 y.o. male with a PMH of ICM ('09 PCI to LAD, mRCA, '10 PCI to Lcx and '12 mRCA), HFrEF (EF ~20% for >5 years) s/p HM-III VAD placement on 12/27 due to end-stage systolic HF.   Assessment/Plan:    1. Acute on chronic systolic HF: Echo 27/74/12 with LVEF 20-25%, Mild MR, Mild/Mod MR, Severe LAE, Mildly reduced RV, PA peak pressure 56 mm Hg.  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 Extubated, initially stopped epinephrine/norepinephrine but restarted with fall in UOP and MAP.  With progressive renal failure, pulmonary edema, and hypotension, he was re-intubated on 12/30.   CVVH was begun.  12/31 was difficult day with refractory shock (MAP in 60s).  Reviewed 12/31 echo which showed no significant pericardial effusion, adequate RV function, and IV septum near mid-line. Off all pressors. Stable. He does not look volume  overloaded on exam.   - Continue aspirin 81 mg daily.  - Warfarin goal INR 2-2.5. INR 2.55.     - Decrease torsemide again to 40 mg once daily, avoid over-diuresis.  2. CAD s/p PCI to mid RCA and PLOM with DES  x 2 and DES to mid LAD in 1/18.   - Continue ASA and statin.  3. PAF: Remains in NSR. 4. AKI on CKD stage III: Suspect some degree of intrinsic renal disease prior to LVAD placement (baseline creatinine around 1.5) with probable ATN from intra-op/peri-op hypotension and development of vasodilatory/septic shock.  CVVHD stopped 05/04/2017. First iHD 05/06/17 without problems.Tunneled HD cath placed 05/08/17.  Vein mapping done plan for AV graft placement if no recovery.  Last HD on 1/24.  Making urine, BUN/creatinine rising but slowly (BUN 20/creatinine 3.9 today). Low BUN seems reassuring.  - No HD today, continue to follow daily for needs. Not sure yet if he will improve or will need long-term HD.  5. Anemia: s/p 1 u PRBCs 12/30, 04/25/17, 1/8. Hgb stable at 8.5.  - Continue aranesp .  - Want to limit transfusion with possibility of transplant down the road. Follow closely.  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 ID: Started on cefepime empirically with septic/vasodilatory shock, PCT was 31 04/25/17.  ABX broadened to Meropenem 04/27/16 with + Achromobacter on tracheal aspirate. Finished 05/07/17 (10 days).  WBCs rising more recently, started on linezolid 1/25 for ?cellulitis around driveline.  WBCs 20K today, afebrile.  - Continue linezolid per ID.  7. Elevated LFTs due to shock liver.  LFTs improved but albumin low. Suspect compromised liver synthetic function. Encouraged protein intake. 8. Pain: Stable. - Continue gabapentin 200 bid 9. Protein-calorie malnutrition - Encouraged protein intake  Length of Stay: [place], MD 05/21/2017, 8:58 AM  VAD Team --- VAD ISSUES ONLY--- Pager [phone] (7am - 7am) Advanced Heart Failure Team  Pager [phone] (M-F; 7a - 4p)    Please contact [facility] for night-coverage after hours (4p -7a ) and weekends on [email or web address]

## 2017-05-21 NOTE — Progress Notes (Signed)
Subjective: No new complaints   Antibiotics:  Anti-infectives (From admission, onward)   Start     Dose/Rate Route Frequency Ordered Stop   05/20/17 0000  levofloxacin (LEVAQUIN) IVPB 500 mg  Status:  Discontinued     500 mg 100 mL/hr over 60 Minutes Intravenous Every 48 hours 05/18/17 0906 05/19/17 1139   05/19/17 1145  linezolid (ZYVOX) tablet 600 mg     600 mg Oral Every 12 hours 05/19/17 1139 05/24/17 0959   05/18/17 0915  levofloxacin (LEVAQUIN) IVPB 750 mg  Status:  Discontinued     750 mg 100 mL/hr over 90 Minutes Intravenous  Once 05/18/17 0906 05/19/17 1139   05/17/17 1200  vancomycin (VANCOCIN) IVPB 750 mg/150 ml premix  Status:  Discontinued     750 mg 150 mL/hr over 60 Minutes Intravenous Every M-W-F (Hemodialysis) 05/15/17 0948 05/15/17 1442   05/15/17 2200  cephALEXin (KEFLEX) capsule 500 mg  Status:  Discontinued     500 mg Oral Every 12 hours 05/15/17 1442 05/15/17 1518   05/15/17 2200  amoxicillin-clavulanate (AUGMENTIN) 500-125 MG per tablet 500 mg  Status:  Discontinued     1 tablet Oral 2 times daily 05/15/17 1518 05/15/17 1522   05/15/17 2200  amoxicillin-clavulanate (AUGMENTIN) 500-125 MG per tablet 500 mg     1 tablet Oral Daily 05/15/17 1522 05/18/17 0933   05/15/17 1200  vancomycin (VANCOCIN) 1,500 mg in sodium chloride 0.9 % 500 mL IVPB  Status:  Discontinued     1,500 mg 250 mL/hr over 120 Minutes Intravenous  Once 05/15/17 0948 05/15/17 1722   05/11/17 2200  cephALEXin (KEFLEX) capsule 500 mg  Status:  Discontinued     500 mg Oral Every 12 hours 05/11/17 1737 05/15/17 0948   05/08/17 1045  ceFAZolin (ANCEF) IVPB 2g/100 mL premix     2 g 200 mL/hr over 30 Minutes Intravenous  Once 05/08/17 1038 05/08/17 1200   05/08/17 1033  ceFAZolin (ANCEF) 2-4 GM/100ML-% IVPB    Comments:  Leak, Brandi   : cabinet override      05/08/17 1033 05/08/17 1200   05/06/17 2000  meropenem (MERREM) 500 mg in sodium chloride 0.9 % 50 mL IVPB  Status:  Discontinued      500 mg 100 mL/hr over 30 Minutes Intravenous Every 24 hours 05/05/17 1039 05/05/17 1407   05/05/17 2000  meropenem (MERREM) 1 g in sodium chloride 0.9 % 100 mL IVPB  Status:  Discontinued     1 g 200 mL/hr over 30 Minutes Intravenous Every 24 hours 05/04/17 1528 05/05/17 0622   05/05/17 2000  meropenem (MERREM) 500 mg in sodium chloride 0.9 % 50 mL IVPB  Status:  Discontinued     500 mg 100 mL/hr over 30 Minutes Intravenous Every 24 hours 05/05/17 0622 05/05/17 1039   05/05/17 2000  meropenem (MERREM) 500 mg in sodium chloride 0.9 % 50 mL IVPB     500 mg 100 mL/hr over 30 Minutes Intravenous Every 24 hours 05/05/17 1407 05/06/17 2155   04/27/17 1700  meropenem (MERREM) 1 g in sodium chloride 0.9 % 100 mL IVPB  Status:  Discontinued     1 g 200 mL/hr over 30 Minutes Intravenous Every 12 hours 04/27/17 1520 05/04/17 1528   04/23/17 2330  ceFEPIme (MAXIPIME) 2 g in dextrose 5 % 50 mL IVPB  Status:  Discontinued     2 g 100 mL/hr over 30 Minutes Intravenous Every 12 hours 04/23/17 1122 04/27/17  1520   04/23/17 0900  ceFEPIme (MAXIPIME) 2 g in dextrose 5 % 50 mL IVPB     2 g 100 mL/hr over 30 Minutes Intravenous  Once 04/23/17 0804 04/23/17 0935   04/21/17 0800  rifampin (RIFADIN) capsule 600 mg     600 mg Oral  Once 04/20/17 1416 04/21/17 0751   04/21/17 0600  fluconazole (DIFLUCAN) IVPB 400 mg     400 mg 100 mL/hr over 120 Minutes Intravenous  Once 04/20/17 1416 04/21/17 0732   04/20/17 2300  cefUROXime (ZINACEF) 1.5 g in dextrose 5 % 50 mL IVPB     1.5 g 100 mL/hr over 30 Minutes Intravenous Every 12 hours 04/20/17 1416 04/22/17 1454   04/20/17 2215  vancomycin (VANCOCIN) IVPB 1000 mg/200 mL premix  Status:  Discontinued     1,000 mg 200 mL/hr over 60 Minutes Intravenous Every 12 hours 04/20/17 1416 04/20/17 1457   04/20/17 2000  vancomycin (VANCOCIN) IVPB 750 mg/150 ml premix  Status:  Discontinued     750 mg 150 mL/hr over 60 Minutes Intravenous Every 12 hours 04/20/17 1457  04/22/17 0732   04/20/17 0600  rifampin (RIFADIN) 600 mg in sodium chloride 0.9 % 100 mL IVPB  Status:  Discontinued     600 mg 200 mL/hr over 30 Minutes Intravenous On call to O.R. 04/19/17 1728 04/20/17 1344   04/20/17 0400  vancomycin (VANCOCIN) 1,250 mg in sodium chloride 0.9 % 250 mL IVPB     1,250 mg 166.7 mL/hr over 90 Minutes Intravenous To Surgery 04/19/17 1754 04/20/17 1400   04/20/17 0400  cefUROXime (ZINACEF) 1.5 g in dextrose 5 % 50 mL IVPB     1.5 g 100 mL/hr over 30 Minutes Intravenous To Surgery 04/19/17 1754 04/20/17 1517   04/20/17 0400  cefUROXime (ZINACEF) 750 mg in dextrose 5 % 50 mL IVPB  Status:  Discontinued     750 mg 100 mL/hr over 30 Minutes Intravenous To Surgery 04/19/17 1754 04/20/17 1344   04/20/17 0400  fluconazole (DIFLUCAN) IVPB 400 mg     400 mg 100 mL/hr over 120 Minutes Intravenous To Surgery 04/19/17 1754 04/20/17 0815   04/20/17 0400  rifampin (RIFADIN) 600 mg in sodium chloride 0.9 % 100 mL IVPB     600 mg 200 mL/hr over 30 Minutes Intravenous To Surgery 04/19/17 1754 04/20/17 1521   04/20/17 0400  vancomycin (VANCOCIN) powder 1,000 mg     1,000 mg Other To Surgery 04/19/17 1754 04/20/17 1036      Medications: Scheduled Meds: . aspirin EC  81 mg Oral Daily  . atorvastatin  40 mg Oral q1800  . chlorhexidine  15 mL Mouth Rinse BID  . citalopram  5 mg Oral Daily  . darbepoetin (ARANESP) injection - DIALYSIS  200 mcg Intravenous Q Thu-HD  . feeding supplement  1 Container Oral BID BM  . ferrous [id number] C-folic acid  1 capsule Oral BID BM  . gabapentin  200 mg Oral BID  . linezolid  600 mg Oral Q12H  . mouth rinse  15 mL Mouth Rinse q12n4p  . pantoprazole  40 mg Oral Daily  . polyethylene glycol  17 g Oral Daily  . potassium chloride  20 mEq Oral Daily  . sevelamer carbonate  800 mg Oral TID WC  . torsemide  40 mg Oral Daily  . Warfarin - Pharmacist Dosing Inpatient   Does not apply q1800   Continuous Infusions: . sodium  chloride Stopped (04/30/17 0800)  . ferric gluconate (  FERRLECIT/NULECIT) IV Stopped (05/18/17 1106)   PRN Meds:.heparin, levalbuterol, ondansetron (ZOFRAN) IV, oxyCODONE, simethicone, sodium chloride flush, sodium chloride flush    Objective: Weight change:   Intake/Output Summary (Last 24 hours) at 05/21/2017 1205 Last data filed at 05/21/2017 0804 Gross per 24 hour  Intake -  Output 1300 ml  Net -1300 ml   Blood pressure 93/79, pulse (!) 59, temperature 97.7 F (36.5 C), temperature source Oral, resp. rate 15, height 5\' 5"  (1.651 m), weight 148 lb 13 oz (67.5 kg), SpO2 (!) 84 %. Temp:  [97.7 F (36.5 C)-99.2 F (37.3 C)] 97.7 F (36.5 C) (01/27 0800) Pulse Rate:  [59-111] 59 (01/27 0800) Resp:  [12-22] 15 (01/27 0800) BP: (92-99)/(59-81) 93/79 (01/27 0800) SpO2:  [84 %-99 %] 84 % (01/27 0800)  Physical Exam: General: Alert and awake, oriented x3, not in any acute distress. HEENT: anicteric sclera, pupils reactive to light and accommodation, EOMI CVS LVAD audible Chest:no wheezing, resp distress Abdomen: soft  Nondistended, Skin: drive line site bandaged and exterior is clean Neuro: nonfocal  CBC:  CBC Latest Ref Rng & Units 05/21/2017 05/20/2017 05/19/2017  WBC 4.0 - 10.5 K/uL 20.6(H) 20.8(H) 24.0(H)  Hemoglobin 13.0 - 17.0 g/dL 8.5(L) 8.5(L) 8.7(L)  Hematocrit 39.0 - 52.0 % 26.6(L) 27.8(L) 27.1(L)  Platelets 150 - 400 K/uL 317 334 345      BMET Recent Labs    05/20/17 1507 05/21/17 0458  NA 130* 132*  K 3.1* 3.5  CL 91* 90*  CO2 26 28  GLUCOSE 135* 81  BUN 18 20  CREATININE 3.68* 3.90*  CALCIUM 8.3* 8.5*     Liver Panel  Recent Labs    05/20/17 1507 05/21/17 0458  ALBUMIN 1.7* 1.6*       Sedimentation Rate No results for input(s): ESRSEDRATE in the last 72 hours. C-Reactive Protein No results for input(s): CRP in the last 72 hours.  Micro Results: Recent Results (from the past 720 hour(s))  Culture, blood (routine x 2)     Status: None     Collection Time: 04/22/17 11:25 AM  Result Value Ref Range Status   Specimen Description BLOOD RIGHT ARM  Final   Special Requests IN PEDIATRIC BOTTLE Blood Culture adequate volume  Final   Culture NO GROWTH 5 DAYS  Final   Report Status 04/27/2017 FINAL  Final  Culture, blood (routine x 2)     Status: None   Collection Time: 04/22/17 11:36 AM  Result Value Ref Range Status   Specimen Description BLOOD RIGHT HAND  Final   Special Requests IN PEDIATRIC BOTTLE Blood Culture adequate volume  Final   Culture NO GROWTH 5 DAYS  Final   Report Status 04/27/2017 FINAL  Final  Culture, respiratory (NON-Expectorated)     Status: None   Collection Time: 04/24/17  8:16 AM  Result Value Ref Range Status   Specimen Description TRACHEAL ASPIRATE  Final   Special Requests NONE  Final   Gram Stain   Final    MODERATE WBC PRESENT,BOTH PMN AND MONONUCLEAR NO ORGANISMS SEEN    Culture FEW ACHROMOBACTER SPECIES  Final   Report Status 04/27/2017 FINAL  Final   Organism ID, Bacteria ACHROMOBACTER SPECIES  Final      Susceptibility   Achromobacter species - MIC*    CEFEPIME >=64 RESISTANT Resistant     CEFAZOLIN >=64 RESISTANT Resistant     GENTAMICIN 4 SENSITIVE Sensitive     CIPROFLOXACIN 2 INTERMEDIATE Intermediate     IMIPENEM 1 SENSITIVE  Sensitive     TRIMETH/SULFA <=20 SENSITIVE Sensitive     * FEW ACHROMOBACTER SPECIES  Urine Culture     Status: None   Collection Time: 04/25/17 12:38 PM  Result Value Ref Range Status   Specimen Description URINE, CATHETERIZED  Final   Special Requests Normal  Final   Culture NO GROWTH  Final   Report Status 04/26/2017 FINAL  Final  Culture, blood (Routine X 2) w Reflex to ID Panel     Status: None (Preliminary result)   Collection Time: 05/19/17  8:24 AM  Result Value Ref Range Status   Specimen Description BLOOD LEFT ANTECUBITAL  Final   Special Requests   Final    BOTTLES DRAWN AEROBIC AND ANAEROBIC Blood Culture adequate volume   Culture NO GROWTH  1 DAY  Final   Report Status PENDING  Incomplete  Culture, blood (Routine X 2) w Reflex to ID Panel     Status: None (Preliminary result)   Collection Time: 05/19/17  8:29 AM  Result Value Ref Range Status   Specimen Description BLOOD RIGHT WRIST  Final   Special Requests   Final    BOTTLES DRAWN AEROBIC AND ANAEROBIC Blood Culture adequate volume   Culture NO GROWTH 1 DAY  Final   Report Status PENDING  Incomplete    Studies/Results: No results found.    Assessment/Plan:  INTERVAL HISTORY:  WBC stable, pt afebrile   Active Problems:   Acute on chronic systolic (congestive) heart failure (HCC)   LVAD (left ventricular assist device) present (HCC)   Cardiogenic shock (HCC)   (HFpEF) heart failure with preserved ejection fraction ([place])   Hemodialysis patient ([place])   Cellulitis, abdominal wall    David [name] is a 52 y.o. male with  LVAD and now now admitted and HD dependent.  He had worsening leukocytosis since 05/10/17 after his HD cath placement steadily 11.2 > 24. In early post-op period he was treated for HCAP with resolution of leukocytosis and clinical improvement. He has had some trauma to the driveline site with worsening drainage from the exit site. This has been treated with Keflex/Augmentin x 1 week. We changed him to zyvox to treat for possible cellulitis around drive line and his WBC has trended down  #1 Possible superficial infection near driveline site:  Would continue zyvox to complete 14 days of therapy  #2 ESRD on HD: this is a HIGH risk situation for someone with a cardiac device as there is high risk for bacteremias   I will sign off for now  Please call with further questions.      LOS: 32 days   Alcide Evener 05/21/2017, 12:05 PM

## 2017-05-22 ENCOUNTER — Telehealth: Payer: Self-pay | Admitting: Licensed Clinical Social Worker

## 2017-05-22 ENCOUNTER — Encounter: Payer: Medicare HMO | Admitting: Internal Medicine

## 2017-05-22 LAB — CBC
HCT: 28.9 % — ABNORMAL LOW (ref 39.0–52.0)
Hemoglobin: 8.7 g/dL — ABNORMAL LOW (ref 13.0–17.0)
MCH: 26.3 pg (ref 26.0–34.0)
MCHC: 30.1 g/dL (ref 30.0–36.0)
MCV: 87.3 fL (ref 78.0–100.0)
PLATELETS: 317 10*3/uL (ref 150–400)
RBC: 3.31 MIL/uL — AB (ref 4.22–5.81)
RDW: 21.4 % — ABNORMAL HIGH (ref 11.5–15.5)
WBC: 19 10*3/uL — AB (ref 4.0–10.5)

## 2017-05-22 LAB — RENAL FUNCTION PANEL
ALBUMIN: 1.7 g/dL — AB (ref 3.5–5.0)
ALBUMIN: 1.7 g/dL — AB (ref 3.5–5.0)
Anion gap: 11 (ref 5–15)
Anion gap: 12 (ref 5–15)
BUN: 25 mg/dL — AB (ref 6–20)
BUN: 25 mg/dL — ABNORMAL HIGH (ref 6–20)
CALCIUM: 8.3 mg/dL — AB (ref 8.9–10.3)
CO2: 32 mmol/L (ref 22–32)
CO2: 30 mmol/L (ref 22–32)
CREATININE: 3.9 mg/dL — AB (ref 0.61–1.24)
Calcium: 8.6 mg/dL — ABNORMAL LOW (ref 8.9–10.3)
Chloride: 93 mmol/L — ABNORMAL LOW (ref 101–111)
Chloride: 91 mmol/L — ABNORMAL LOW (ref 101–111)
Creatinine, Ser: 4.11 mg/dL — ABNORMAL HIGH (ref 0.61–1.24)
GFR calc Af Amer: 18 mL/min — ABNORMAL LOW (ref >60)
GFR calc non Af Amer: 15 mL/min — ABNORMAL LOW (ref >60)
GFR, EST AFRICAN AMERICAN: 19 mL/min — AB (ref >60)
GFR, EST NON AFRICAN AMERICAN: 16 mL/min — AB (ref >60)
GLUCOSE: 73 mg/dL (ref 65–99)
Glucose, Bld: 104 mg/dL — ABNORMAL HIGH (ref 65–99)
PHOSPHORUS: 6.4 mg/dL — AB (ref 2.5–4.6)
POTASSIUM: 4.1 mmol/L (ref 3.5–5.1)
Phosphorus: 5.8 mg/dL — ABNORMAL HIGH (ref 2.5–4.6)
Potassium: 4 mmol/L (ref 3.5–5.1)
SODIUM: 133 mmol/L — AB (ref 135–145)
Sodium: 136 mmol/L (ref 135–145)

## 2017-05-22 LAB — MAGNESIUM: MAGNESIUM: 1.7 mg/dL (ref 1.7–2.4)

## 2017-05-22 LAB — PROTIME-INR
INR: 2.19
PROTHROMBIN TIME: 24.2 s — AB (ref 11.4–15.2)

## 2017-05-22 LAB — LACTATE DEHYDROGENASE: LDH: 207 U/L — ABNORMAL HIGH (ref 98–192)

## 2017-05-22 MED ORDER — MAGNESIUM SULFATE 2 GM/50ML IV SOLN
2.0000 g | Freq: Once | INTRAVENOUS | Status: AC
  Administered 2017-05-22: 2 g via INTRAVENOUS
  Filled 2017-05-22: qty 50

## 2017-05-22 MED ORDER — WARFARIN SODIUM 2 MG PO TABS
2.0000 mg | ORAL_TABLET | Freq: Once | ORAL | Status: AC
  Administered 2017-05-22: 2 mg via ORAL
  Filled 2017-05-22: qty 1

## 2017-05-22 MED ORDER — GABAPENTIN 300 MG PO CAPS
300.0000 mg | ORAL_CAPSULE | Freq: Two times a day (BID) | ORAL | Status: DC
Start: 2017-05-22 — End: 2017-05-22
  Administered 2017-05-22: 300 mg via ORAL
  Filled 2017-05-22: qty 1

## 2017-05-22 NOTE — Progress Notes (Signed)
LVAD Coordinator Rounding Note:  Admitted12/26due to decompensated heart failure.  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  Pt states he is "not feeling well today". Says he hasn't felt well over last few days. He is c/o chronic incision pain that "hurts all the time", but especially when coughing. Encouraged him to splint with coughing and deep breathing. Pt says he will "not be getting out of bed to walk today" because he "isn't up to it".   Vital signs: Tmax: 98.2 HR:70 Doppler Pressure: 92 Automatic cuff: 96/81 (88) O2 Sat: 97 % on2 L/[place] Wt:167>168>167>158>157>154>151>152>150>146>148>145 lbs  LVAD interrogation reveals:  Speed:5400 Flow: 4.8 Power:4.1w PI:4.5 Alarms:none Events:10 PI events Hematocrit:27 Fixed speed:5400 Low speed limit:5100  Drive line site: dressing dry and intact; anchor intact and accurately applied. VAD Coordinator or wife will change dressing later today.  Labs:  LDH trend:376>355>315>265>263>281>293>224>231>259>242>240>209>207  INRtrend:1.49>1.63>1.99>1.97>2.52>3.72>4.60>4.76>3.81>3.33>2.99>2.55>2.38>2.19  Anticoagulation Plan: -INR Goal:2-2.5 -ASA Dose: 81 mg   Blood Products: Intra op:2 units/FFP 04/20/17  Post QI:HKVQ'Q 1 unit 12/29 1 unit 12/30 1 unit 04/25/17 1 unit 05/02/17 1 unit 05/08/17  Device:Medtronic BiV -Therapies:on  Arrhythmia: 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 (on amiodarone and Eliquis for this) - Afib 12/29 - 04/28/17 - NSR 1/5 - present  Nitric Oxide: - stopped on 12/30 due to initiation of BiPap  Respiratory: - Extubated 12/28 - Reintubated 04/23/2017 - Extubated 04/26/2017  Renal:  04/23/17 - CRRT started for AKI - stopped 05/05/17 -Intermittent HD Monday, Wednesday, Friday   Infection: - 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 2.Please call VAD Pager for any equipment concerns or drive line dressing questions/concerns. 3. VAD education to be completed at [facility] February 5th.   Zada Girt RN, VAD Coordinator 24/7 pager [phone]

## 2017-05-22 NOTE — Telephone Encounter (Signed)
CSW received message from [facility] worker Ysmael Hires [phone] stating she is actively working on case. Patient will need bill from current hospitalization to meet deductible. Medicaid worker aware will need to wait for discharge and bill to be processed through insurance before patient's remaining financial responsibility. Worker states case may be "pending" for up to 6 months. CSW will follow up with wife to further explain and assure continued follow through on pending Medicaid. Raquel Sarna, [place], [place]

## 2017-05-22 NOTE — Progress Notes (Signed)
Patient ID: David [name], male   DOB: [birth year], 52 y.o.   MRN: [id number]   Advanced Heart Failure VAD Team Note  Subjective:    David [name] is a 52 y.o. male with a PMH of ICM ('09 PCI to LAD, mRCA, '10 PCI to Lcx and '12 mRCA), HFrEF (EF ~20% for >5 years), HLD and HTN admitted 12/26 for VAD placement due to end-stage systolic HF.   - Underwent successful HM-3 LVAD placement 04/20/17. 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 CVVHD started 12/30 - Started on Meropenem 04/27/16 with + Achromobacter in tracheal aspirate. (Finished 05/07/17) - CVVHD stopped 1/10 - 1st iHD 1/12 - Tunneled HD cath placed 05/08/17 - Keflex for mild drainage from Driveline 9/93/71 switched augmentin. Completed augmentin 1/23 - Started linezolid 1/25 for ?cellulitis -> to 06/02/17  WBCs stable at 19.0 K. Afebrile. MAPs 80s.   UOP 1700. Creatinine has been higher. Labs pending today.    Feeling slightly down today. A bit weaker.  Denies SOB. Still having chest soreness. Has been walking halls. Appetite improving.   Echo 12/31 reviewed: No significant pericardial effusion, IV septum midline to slightly to the right, RV function adequate.  LVAD INTERROGATION:  HeartMate III LVAD:  Flow 5.1 liters/min, speed 5400, power 4.0, PI 3.5. Occasional PI events.   Objective:    Vital Signs:   Temp:  [97.4 F (36.3 C)-97.9 F (36.6 C)] 97.7 F (36.5 C) (01/28 0748) Pulse Rate:  [70-87] 70 (01/28 0435) Resp:  [12-18] 18 (01/28 0435) BP: (87-104)/(52-84) 96/81 (01/28 0748) SpO2:  [97 %-99 %] 97 % (01/28 0435) Weight:  [145 lb 1 oz (65.8 kg)] 145 lb 1 oz (65.8 kg) (01/28 0435) Last BM Date: 05/20/17 Mean arterial Pressure 80s  Intake/Output:   Intake/Output Summary (Last 24 hours) at 05/22/2017 0859 Last data filed at 05/22/2017 0808 Gross per 24 hour  Intake 120 ml  Output 1650 ml  Net -1530 ml   Physical Exam  MAP 88  Physical Exam: GENERAL: Well appearing this am. NAD.  HEENT: Normal. Anicteric    NECK: Supple, JVP 7-8 cm. Carotids OK.  CARDIAC:  Mechanical heart sounds with LVAD hum present.  Chest wall tender to touch. No rub  LUNGS:  CTAB, normal effort.  ABDOMEN:  NT, ND, no HSM. No bruits or masses. +BS  LVAD exit site: Well-healed and incorporated. Dressing dry and intact. No erythema or drainage. Stabilization device present and accurately applied. Driveline dressing changed daily per sterile technique. EXTREMITIES:  Warm and dry. No cyanosis, clubbing, rash, or edema.  NEUROLOGIC:  Alert & oriented x 3. Cranial nerves grossly intact. Moves all 4 extremities w/o difficulty. Affect pleasant     Telemetry   NSR 80s, personally reviewed.   Labs   Basic Metabolic Panel: Recent Labs  Lab 05/17/17 0503  05/18/17 6967  05/19/17 0246 05/19/17 1349 05/20/17 0649 05/20/17 1507 05/21/17 0458 05/21/17 1502  NA 134*   < > 136   < > 135 133* 132* 130* 132* 133*  K 3.2*   < > 2.9*   < > 3.4* 3.3* 3.1* 3.1* 3.5 3.9  CL 94*   < > 91*   < > 96* 93* 91* 91* 90* 91*  CO2 28   < > 31   < > 28 28 28 26 28 30   GLUCOSE 89   < > 75   < > 93 104* 81 135* 81 99  BUN 11   < > 18   < >  10 13 16 18 20  22*  CREATININE 3.66*   < > 3.96*   < > 2.65* 3.11* 3.44* 3.68* 3.90* 3.91*  CALCIUM 8.6*   < > 8.7*   < > 8.4* 8.5* 8.5* 8.3* 8.5* 8.5*  MG 1.4*  --  2.1  --  1.7  --  1.8  --  1.6*  --   PHOS 2.7   < > 3.2   < > 2.4* 3.5 4.7* 4.4 6.4* 6.2*   < > = values in this interval not displayed.    Liver Function Tests: Recent Labs  Lab 05/19/17 1349 05/20/17 0649 05/20/17 1507 05/21/17 0458 05/21/17 1502  ALBUMIN 1.7* 1.7* 1.7* 1.6* 1.7*   No results for input(s): LIPASE, AMYLASE in the last 168 hours. No results for input(s): AMMONIA in the last 168 hours.  CBC: Recent Labs  Lab 05/17/17 0503 05/18/17 0318 05/19/17 0246 05/20/17 0649 05/21/17 0458  WBC 17.6* 20.8* 24.0* 20.8* 20.6*  NEUTROABS  --   --   --  14.4*  --   HGB 8.0* 8.2* 8.7* 8.5* 8.5*  HCT 26.9* 27.0* 27.1* 27.8*  26.6*  MCV 87.6 86.3 86.0 86.3 86.1  PLT 365 364 345 334 317    INR: Recent Labs  Lab 05/17/17 0503 05/18/17 0318 05/19/17 0246 05/20/17 0649 05/21/17 0458  INR 3.81 3.33 2.99 2.55 2.38    Other results:  Imaging   No results found.   Medications:     Scheduled Medications: . aspirin EC  81 mg Oral Daily  . atorvastatin  40 mg Oral q1800  . chlorhexidine  15 mL Mouth Rinse BID  . citalopram  5 mg Oral Daily  . darbepoetin (ARANESP) injection - DIALYSIS  200 mcg Intravenous Q Thu-HD  . feeding supplement  1 Container Oral BID BM  . ferrous [id number] C-folic acid  1 capsule Oral BID BM  . gabapentin  200 mg Oral BID  . linezolid  600 mg Oral Q12H  . mouth rinse  15 mL Mouth Rinse q12n4p  . pantoprazole  40 mg Oral Daily  . polyethylene glycol  17 g Oral Daily  . potassium chloride  20 mEq Oral Daily  . sevelamer carbonate  800 mg Oral TID WC  . torsemide  40 mg Oral Daily  . Warfarin - Pharmacist Dosing Inpatient   Does not apply q1800    Infusions: . sodium chloride Stopped (04/30/17 0800)  . ferric gluconate (FERRLECIT/NULECIT) IV Stopped (05/18/17 1106)    PRN Medications: heparin, levalbuterol, ondansetron (ZOFRAN) IV, oxyCODONE, simethicone, sodium chloride flush, sodium chloride flush   Patient Profile   David [name] is a 52 y.o. male with a PMH of ICM ('09 PCI to LAD, mRCA, '10 PCI to Lcx and '12 mRCA), HFrEF (EF ~20% for >5 years) s/p HM-III VAD placement on 12/27 due to end-stage systolic HF.   Assessment/Plan:    1. Acute on chronic systolic HF: Echo 66/29/47 with LVEF 20-25%, Mild MR, Mild/Mod MR, Severe LAE, Mildly reduced RV, PA peak pressure 56 mm Hg.  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 Extubated, initially stopped epinephrine/norepinephrine but restarted with fall in UOP and MAP.  With progressive renal failure, pulmonary edema, and hypotension, he was re-intubated on 12/30.   CVVH was  begun.  12/31 was difficult day with refractory shock (MAP in 60s).  Reviewed 12/31 echo which showed no significant pericardial effusion, adequate RV function, and IV septum near mid-line. Off all pressors.  Stable. - Volume status looks OK on exam.  - Continue aspirin 81 mg daily.  - Warfarin goal INR 2-2.5. INR pending today. - Continue torsemide 40 mg daily.  2. CAD s/p PCI to mid RCA and PLOM with DES x 2 and DES to mid LAD in 1/18.   - Continue ASA and statin. No change.  3. PAF:  - Remains in NSR. 4. AKI on CKD stage III: Suspect some degree of intrinsic renal disease prior to LVAD placement (baseline creatinine around 1.5) with probable ATN from intra-op/peri-op hypotension and development of vasodilatory/septic shock.  CVVHD stopped 05/04/2017. First iHD 05/06/17 without problems.Tunneled HD cath placed 05/08/17.  Vein mapping done plan for AV graft placement if no recovery.  Last HD on 1/24.   - Holding HD. Following daily. UOP continues to improve, and low BUN is reassuring. Not sure yet if he will improve or will need long-term HD.  5. Anemia: s/p 1 u PRBCs 12/30, 04/25/17, 1/8.  - Hgb stable at 8.7.  - Continue aranesp.  - Want to limit transfusion with possibility of transplant down the road. Follow closely.  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 ID: Started on cefepime empirically with septic/vasodilatory shock, PCT was 31 04/25/17.  ABX broadened to Meropenem 04/27/16 with + Achromobacter on tracheal aspirate. Finished 05/07/17 (10 days).  WBCs rising more recently, started on linezolid 1/25 for ?cellulitis around driveline.   - WBCs 93.8 today. Afebrile.  - Will complete linezolid 06/02/17. 7. Elevated LFTs due to shock liver.   - LFTs improved but albumin low. Suspect compromised liver synthetic function. Encouraged protein intake. 8. Pain:  - Mostly stable but continues to limit mobility at times - Increase gabapentin to 300 mg BID.  9. Protein-calorie malnutrition -  Encouraged po intake and activity.   Length of Stay: [place], [place] 05/22/2017, 8:59 AM  VAD Team --- VAD ISSUES ONLY--- Pager [phone] (7am - 7am) Advanced Heart Failure Team  Pager [phone] (M-F; 7a - 4p)  Please contact [facility] for night-coverage after hours (4p -7a ) and weekends on [email or web address]  Patient seen and examined with the above-signed Advanced Practice Provider and/or Housestaff. I personally reviewed laboratory data, imaging studies and relevant notes. I independently examined the patient and formulated the important aspects of the plan. I have edited the note to reflect any of my changes or salient points. I have personally discussed the plan with the patient and/or family.  Feels weak today. ? Mild uremia but BUN only 25. Possibly related at Zyvox?  Volume status ok. Has been off HD since Thursday. Creatinine up only marginally. Would continue to hold off HD for now if possible. Limit pain meds. Encourage walking and po intake.  WBC decreasing slowly. No fevers or chills.  Will need to work with him and Renal to discuss disposition date. Suspect we will need to arrange f/u for outpatient HD to facilitate discharge but hopefully will continue to have more renal recovery.    INR 2.19 today. Discussed dosing with PharmD personally.  VAD interrogated personally. Parameters stable.  Glori Bickers, MD  11:40 AM

## 2017-05-22 NOTE — Progress Notes (Signed)
CSW met at bedside with patient and wife. Patient states "I'm good because my wife is here and she keeps me strong". CSW and wife discussed pending medicaid application and need for current hospital bill although need to wait for discharge and insurance payment prior to submitting to Medicaid. Wife shared feelings regarding lengthy hospitalization. CSW provided supportive intervention and will continue to follow for support. Raquel Sarna, [place], [place]

## 2017-05-22 NOTE — Progress Notes (Signed)
CARDIAC REHAB PHASE I   PRE:  Rate/Rhythm: 94 SR    BP: pt up in room    SaO2: on 2L  MODE:  Ambulation: 850 ft   POST:  Rate/Rhythm: 98 SR    BP: sitting 98      SaO2: would not register  Pt up in room, preparing to walk. Walked 425 ft with 2L. Rested and I applied [place] pulse ox probe. SaO2 93-97 RA at rest for about 30 seconds. Stopped registering and did not register again. Pt walked another 425 ft on RA, denied SOB. Slightly tired toward end. Pt to BR after walk, went on RA. Discussed increasing IS, walking, etc to help his SOB. [place], ACSM 05/22/2017 2:24 PM

## 2017-05-22 NOTE — Progress Notes (Signed)
Pt declined walking right now. Waiting on wife. Will check back later.  Yves Dill CES, ACSM 11:28 AM 05/22/2017

## 2017-05-22 NOTE — Progress Notes (Addendum)
89M with systolic CHF s/p LVAD 42/68  with AKI  On CKD--> ESRD, ?new-start HD.    1.  AKI on CKD III--> ESRD: likely due to ATN from shock s/p LVAD placement with underlying arterionephrosclerosis.  Required CRRT initially - transitioned to IHD (1/11).  S/p TDC with IR 05/08/17.  UOP good (1200 yest) but crt inc from 2.11 -> 3.11->3.4->3.6->3.9> 4.11 off of HD,  s/p vein mapping 05/12/17  (Working on CLIP to OP unit- lives in [place]- need to make sure they can take LVAD pt if he is to continue HD).  No HD needs today. WIll monitor for recovery. Cardiology dosing diuretics.  Hold gabapentin due to myoclonic jerks and asterixis.  2.  Acute on chronic systolic CHF: s/p Heartmate III 12/27.  Advanced HF and cardiothoracic surgery following.    Coumadin has been restarted (1/14).  3.  Driveline drainage: On zyvox now per ID   4. Anemia- iron stores low, giving iron - on darbe 200 q week   Subjective: Interval History: No issues, good UOP  Objective: Vital signs in last 24 hours: Temp:  [97.4 F (36.3 C)-98.2 F (36.8 C)] 98.2 F (36.8 C) (01/28 1300) Pulse Rate:  [70-87] 70 (01/28 0435) Resp:  [12-18] 18 (01/28 0435) BP: (87-111)/(52-84) 111/73 (01/28 1300) SpO2:  [97 %-99 %] 97 % (01/28 0435) Weight:  [65.8 kg (145 lb 1 oz)] 65.8 kg (145 lb 1 oz) (01/28 0435) Weight change:   Intake/Output from previous day: 01/27 0701 - 01/28 0700 In: 120 [P.O.:120] Out: 1700 [Urine:1700] Intake/Output this shift: Total I/O In: -  Out: 375 [Urine:375]  General appearance: alert and cooperative Resp: clear to auscultation bilaterally and hum of LVAD Chest wall: no tenderness, TDC on right Extremities: extremities normal, atraumatic, no cyanosis or edema Abd Sutures and LVAD Asterixis  Lab Results: Recent Labs    05/21/17 0458 05/22/17 0816  WBC 20.6* 19.0*  HGB 8.5* 8.7*  HCT 26.6* 28.9*  PLT 317 317   BMET:  Recent Labs    05/21/17 1502 05/22/17 0816  NA 133* 136  K 3.9 4.1   CL 91* 93*  CO2 30 32  GLUCOSE 99 73  BUN 22* 25*  CREATININE 3.91* 4.11*  CALCIUM 8.5* 8.6*   No results for input(s): PTH in the last 72 hours. Iron Studies: No results for input(s): IRON, TIBC, TRANSFERRIN, FERRITIN in the last 72 hours. Studies/Results: No results found.  Scheduled: . aspirin EC  81 mg Oral Daily  . atorvastatin  40 mg Oral q1800  . chlorhexidine  15 mL Mouth Rinse BID  . darbepoetin (ARANESP) injection - DIALYSIS  200 mcg Intravenous Q Thu-HD  . feeding supplement  1 Container Oral BID BM  . ferrous [id number] C-folic acid  1 capsule Oral BID BM  . linezolid  600 mg Oral Q12H  . mouth rinse  15 mL Mouth Rinse q12n4p  . pantoprazole  40 mg Oral Daily  . polyethylene glycol  17 g Oral Daily  . potassium chloride  20 mEq Oral Daily  . sevelamer carbonate  800 mg Oral TID WC  . torsemide  40 mg Oral Daily  . Warfarin - Pharmacist Dosing Inpatient   Does not apply q1800   Continuous: . sodium chloride Stopped (04/30/17 0800)  . ferric gluconate (FERRLECIT/NULECIT) IV Stopped (05/18/17 1106)     LOS: 33 days   Estanislado Emms 05/22/2017,1:18 PM

## 2017-05-22 NOTE — Progress Notes (Addendum)
Occupational Therapy Treatment Patient Details Name: David [name] MRN: [id number] DOB: 09-26-1965 Today's Date: 05/22/2017    History of present illness This 52 y.o. male admitted for LVAD. Pt extubated 12/28.   on 12/30, pt developed respiratory distress with pulmonary edema, and increased creatanine.  He was intubated 12/30 > 04/26/16, and CRRT initiated 12/30 and discontinued 1/10. Pt now on intermittent HD.   PMH includes:  Acute on chronic systolic HF, CAD, PAF, CKD stage III, LBBB, frequent PVCs, seizures, PNS, MI, ischemic cardiomyopathy, s/p AICD    OT comments  Pt readily willing to get OOB and work with OT. Supervision for LB dressing, grooming, transfer to 3 in 1 and min assist to manage power source change. Pt able to state items in black bag, difficulty naming clips. Progressing well.  Follow Up Recommendations  Home health OT;Supervision/Assistance - 24 hour    Equipment Recommendations  Tub/shower seat    Recommendations for Other Services      Precautions / Restrictions Precautions Precautions: Fall;Sternal Precaution Comments: remphasized the need to be aware of drive line with bed mobility Restrictions Weight Bearing Restrictions: No       Mobility Bed Mobility Overal bed mobility: Modified Independent                Transfers Overall transfer level: Needs assistance Equipment used: 4-wheeled walker Transfers: Sit to/from Stand Sit to Stand: Supervision         General transfer comment: cues for use of breaks     Balance     Sitting balance-Leahy Scale: Good       Standing balance-Leahy Scale: Fair                             ADL either performed or assessed with clinical judgement   ADL Overall ADL's : Needs assistance/impaired   Eating/Feeding Details (indicate cue type and reason): pt reporting tremor preventing him from drinking coffee, provided mug with lid Grooming: Wash/dry face;Standing;Supervision/safety               Lower Body Dressing: Supervision/safety;Sit to/from stand Lower Body Dressing Details (indicate cue type and reason): donned pants, socks at EOB Toilet Transfer: Supervision/safety;Ambulation;RW;BSC   Toileting- Water quality scientist and Hygiene: Supervision/safety;Sit to/from stand       Functional mobility during ADLs: Supervision/safety;Rolling walker       Vision       Perception     Praxis      Cognition Arousal/Alertness: Awake/alert Behavior During Therapy: WFL for tasks assessed/performed Overall Cognitive Status: Impaired/Different from baseline Area of Impairment: Safety/judgement;Problem solving;Memory                     Memory: Decreased short-term memory   Safety/Judgement: Decreased awareness of safety   Problem Solving: Difficulty sequencing;Requires verbal cues General Comments: pt in attentive to drive line and controller with bed mobility        Exercises Other Exercises Other Exercises: min verbal cues for LVAD power source change   Shoulder Instructions       General Comments      Pertinent Vitals/ Pain       Pain Assessment: No/denies pain  Home Living                                          Prior Functioning/Environment  Frequency  Min 2X/week        Progress Toward Goals  OT Goals(current goals can now be found in the care plan section)  Progress towards OT goals: Progressing toward goals  Acute Rehab OT Goals Patient Stated Goal: To get stronger and go home  OT Goal Formulation: With patient Time For Goal Achievement: 05/26/17 Potential to Achieve Goals: Good  Plan Discharge plan remains appropriate    Co-evaluation                 AM-PAC PT "6 Clicks" Daily Activity     Outcome Measure   Help from another person eating meals?: None Help from another person taking care of personal grooming?: A Little Help from another person toileting, which includes  using toliet, bedpan, or urinal?: A Little Help from another person bathing (including washing, rinsing, drying)?: A Little Help from another person to put on and taking off regular upper body clothing?: A Little Help from another person to put on and taking off regular lower body clothing?: A Little 6 Click Score: 19    End of Session Equipment Utilized During Treatment: Oxygen;Gait belt;Rolling walker  OT Visit Diagnosis: Unsteadiness on feet (R26.81);Other symptoms and signs involving cognitive function   Activity Tolerance Patient tolerated treatment well   Patient Left in chair;with call bell/phone within reach   Nurse Communication (ok to have coffee)        Time: 6568-1275 OT Time Calculation (min): 30 min  Charges: OT General Charges $OT Visit: 1 Visit OT Treatments $Self Care/Home Management : 23-37 mins  05/22/2017 David [name], OTR/L Pager: [phone]   David [name] David [name] 05/22/2017, 12:05 PM

## 2017-05-22 NOTE — Progress Notes (Signed)
CARDIAC REHAB PHASE I   PRE:                Rate/Rhythm: 97 sinus rhyth,  BP:                  Supine:                        Sitting: 80                    Standing:                          SaO2: 93% 2L  MODE:            Ambulation: 850  ft        POST:             Rate/Rhythem: 101 sinus tachycardiac   BP:                  Supine:                        Sitting: 78                    Standing:                          SaO2: unable to obtain  Pt ambulated in hallway x2 assist. Pt able to transfer from battery pack to monitor with minimal assist.  Pt asymptomatic and verbalizes enjoyment of his ambulation.  [place]  05/20/17 1:53 PM

## 2017-05-22 NOTE — Progress Notes (Signed)
Patient ID: David [name], male   DOB: 1965/12/14, 52 y.o.   MRN: [id number] HeartMate 3 Rounding Note  Subjective:     He is getting ready to walk Feels weaker today but felt better after getting up walking this am.   LVAD INTERROGATION:  HeartMate IIl LVAD:  Flow 5 liters/min, speed 5400, power 4, PI 3.5   Objective:    Vital Signs:   Temp:  [97.4 F (36.3 C)-98.2 F (36.8 C)] 98.2 F (36.8 C) (01/28 1300) Pulse Rate:  [70-87] 70 (01/28 0435) Resp:  [12-18] 18 (01/28 0435) BP: (87-111)/(52-84) 111/73 (01/28 1300) SpO2:  [97 %-99 %] 97 % (01/28 0435) Weight:  [65.8 kg (145 lb 1 oz)] 65.8 kg (145 lb 1 oz) (01/28 0435) Last BM Date: 05/20/17 Mean arterial Pressure 80's  Intake/Output:   Intake/Output Summary (Last 24 hours) at 05/22/2017 1429 Last data filed at 05/22/2017 0808 Gross per 24 hour  Intake 120 ml  Output 1650 ml  Net -1530 ml     Physical Exam: General:  Looks comfortable,   No resp difficulty Cor:  LVAD hum present. Lungs: clear Chest incision well-healed Abdomen: soft, nontender, nondistended. Good bowel sounds. Abdominal incision well-healed. No tenderness over drive line. Extremities: minimal bilateral LE edema. TED stockings on Neuro: alert & orientedx3,  moves all 4 extremities w/o difficulty. Affect pleasant Driveline dressing to be done by VAD coordinator.  Telemetry: sinus rhythm 80's  Labs: Basic Metabolic Panel: Recent Labs  Lab 05/18/17 0318  05/19/17 0246  05/20/17 0649 05/20/17 1507 05/21/17 0458 05/21/17 1502 05/22/17 0816  NA 136   < > 135   < > 132* 130* 132* 133* 136  K 2.9*   < > 3.4*   < > 3.1* 3.1* 3.5 3.9 4.1  CL 91*   < > 96*   < > 91* 91* 90* 91* 93*  CO2 31   < > 28   < > 28 26 28 30  32  GLUCOSE 75   < > 93   < > 81 135* 81 99 73  BUN 18   < > 10   < > 16 18 20  22* 25*  CREATININE 3.96*   < > 2.65*   < > 3.44* 3.68* 3.90* 3.91* 4.11*  CALCIUM 8.7*   < > 8.4*   < > 8.5* 8.3* 8.5* 8.5* 8.6*  MG 2.1  --  1.7  --  1.8   --  1.6*  --  1.7  PHOS 3.2   < > 2.4*   < > 4.7* 4.4 6.4* 6.2* 6.4*   < > = values in this interval not displayed.    Liver Function Tests: Recent Labs  Lab 05/20/17 0649 05/20/17 1507 05/21/17 0458 05/21/17 1502 05/22/17 0816  ALBUMIN 1.7* 1.7* 1.6* 1.7* 1.7*   No results for input(s): LIPASE, AMYLASE in the last 168 hours. No results for input(s): AMMONIA in the last 168 hours.  CBC: Recent Labs  Lab 05/18/17 0318 05/19/17 0246 05/20/17 0649 05/21/17 0458 05/22/17 0816  WBC 20.8* 24.0* 20.8* 20.6* 19.0*  NEUTROABS  --   --  14.4*  --   --   HGB 8.2* 8.7* 8.5* 8.5* 8.7*  HCT 27.0* 27.1* 27.8* 26.6* 28.9*  MCV 86.3 86.0 86.3 86.1 87.3  PLT 364 345 334 317 317    INR: Recent Labs  Lab 05/18/17 0318 05/19/17 0246 05/20/17 0649 05/21/17 0458 05/22/17 0816  INR 3.33 2.99 2.55 2.38 2.19    Other results:  EKG:   Imaging: No results found.   Medications:     Scheduled Medications: . aspirin EC  81 mg Oral Daily  . atorvastatin  40 mg Oral q1800  . chlorhexidine  15 mL Mouth Rinse BID  . darbepoetin (ARANESP) injection - DIALYSIS  200 mcg Intravenous Q Thu-HD  . feeding supplement  1 Container Oral BID BM  . ferrous [id number] C-folic acid  1 capsule Oral BID BM  . linezolid  600 mg Oral Q12H  . mouth rinse  15 mL Mouth Rinse q12n4p  . pantoprazole  40 mg Oral Daily  . polyethylene glycol  17 g Oral Daily  . potassium chloride  20 mEq Oral Daily  . sevelamer carbonate  800 mg Oral TID WC  . torsemide  40 mg Oral Daily  . Warfarin - Pharmacist Dosing Inpatient   Does not apply q1800    Infusions: . sodium chloride Stopped (04/30/17 0800)  . ferric gluconate (FERRLECIT/NULECIT) IV Stopped (05/18/17 1106)    PRN Medications: heparin, levalbuterol, ondansetron (ZOFRAN) IV, oxyCODONE, simethicone, sodium chloride flush, sodium chloride flush   Assessment:   1. Ischemic cardiomyopathy s/p multiple PCI's with chronic systolic heart  failure and EF 20% on home dobutamine preop with significant improvement in symptoms. [facility]uated at [facility] for heart [facility] but LVAD felt to be the best treatment for him at this time. POD31s/p HeartMate III LVAD. He was critically ill with multi-system organ failure but has continued to recover and only issue now is renal failure requiring HD. Hemodynamics and VAD parameters very stable 2.H/OHypertension 3.H/OHyperlipidemia 4. PAF in sinuspreopon amiodarone and Eliquis.Now back in sinus off amiodarone.  5.H/OStage III CKD. Acute on chronic postop renal failureon intermittent HD. Making good urine with Demedex. Dose decreased today since he has been >1L negative daily. Holding off on further HD for now to see how kidneys support him. Creat up slightly from yesterday. 6. LBBB s/p CRT-D upgrade in 10/2016 without improvement in symptoms or EF. LV epicardial leads cut off at the time of surgery. 7. Prior smoking with mild obstruction and severe diffusion defect on PFT's. Continue IS. 8. Postop leukocytosis and fever. Now on Zyvox.  The only concerning source would be dialysis catheter infection. BC negative from 1.25. Afebrile and feels fine. 9. Marked rise in bilirubin and transaminases. Likely due to pump runand right sided congestion, hypotension.Resolved   10. INR therapeutic.  Coumadin per pharmacy.     11. Severe malnutrition with albumin 1.6-1.7. He is eating fairly well now.  Continue to encourage nutrition.    12. PT    13. Anemia: follow. He is on iron.           Plan/Discussion:    Overall he has been very stable. MAP in good range.  VAD flow has been great. Holding off on further HD for now. Continue Zyvox for leukocytosis of undetermined etiology.  Continue nutrition, ambulation, IS.   I reviewed the LVAD parameters from today, and compared the results to the patient's prior recorded data.  No programming changes were made.  The LVAD is functioning within specified  parameters.   LVAD interrogation was negative for any significant power changes, alarms or PI events/speed drops.  LVAD equipment check completed and is in good working order.  Back-up equipment present.   LVAD education done on emergency procedures and precautions and reviewed exit site care.  Length of Stay: [address]  Fernande Boyden [facility] 05/22/2017, 2:29 PM

## 2017-05-22 NOTE — Progress Notes (Signed)
PT Cancellation Note  Patient Details Name: David [name] MRN: [id number] DOB: 1965-10-10   Cancelled Treatment:    Reason Eval/Treat Not Completed: (P) Fatigue/lethargy limiting ability to participate(Pt sleeping on arrival and reports he has not rested all day.  Will f/u per POC.  )   Chanti Golubski Eli Hose 05/22/2017, 4:04 PM  [name], PTA pager [phone]

## 2017-05-22 NOTE — Progress Notes (Addendum)
[place] for Coumadin Indication: LVAD  Allergies  Allergen Reactions  . Plavix [Clopidogrel Bisulfate] Hives   Patient Measurements: Height: 5\' 5"  (165.1 cm) Weight: 145 lb 1 oz (65.8 kg) IBW/kg (Calculated) : 61.5  Vital Signs: Temp: 98.2 F (36.8 C) (01/28 1300) Temp Source: Oral (01/28 1300) BP: 111/73 (01/28 1300) Pulse Rate: 70 (01/28 0435)  Labs: Recent Labs    05/20/17 0649  05/21/17 0458 05/21/17 1502 05/22/17 0816  HGB 8.5*  --  8.5*  --  8.7*  HCT 27.8*  --  26.6*  --  28.9*  PLT 334  --  317  --  317  LABPROT 27.2*  --  25.8*  --  24.2*  INR 2.55  --  2.38  --  2.19  CREATININE 3.44*   < > 3.90* 3.91* 4.11*   < > = values in this interval not displayed.   Estimated Creatinine Clearance: 18.1 mL/min (A) (by C-G formula based on SCr of 4.11 mg/dL (H)).  Assessment: 52 yo male s/p LVAD implantation 12/27. Warfarin on hold with plan for possible HD graft next week, will need heparin bridge once INR <2.0. INR now down to 2.19 from 2.38 this morning, CBC stable. No signs/symptoms of bleeding.  Per nephro, no plans for HD today. No plans for HD catheter placement this admission - okay with cardiology to resume warfarin.   Goal of Therapy:  INR 2-2.5 Monitor platelets by anticoagulation protocol: Yes   Plan: -Warfarin 2 mg tonight -Daily PT/INR  Doylene Canard, PharmD Clinical Pharmacist  Pager: [phone] Clinical Phone for 05/22/2017 until 3:30pm: [phone] If after 3:30pm, please call main pharmacy at [phone]  05/22/2017 1:24 PM

## 2017-05-23 LAB — RENAL FUNCTION PANEL
ANION GAP: 11 (ref 5–15)
ANION GAP: 14 (ref 5–15)
Albumin: 1.7 g/dL — ABNORMAL LOW (ref 3.5–5.0)
Albumin: 1.7 g/dL — ABNORMAL LOW (ref 3.5–5.0)
BUN: 26 mg/dL — AB (ref 6–20)
BUN: 25 mg/dL — ABNORMAL HIGH (ref 6–20)
CALCIUM: 8.2 mg/dL — AB (ref 8.9–10.3)
CHLORIDE: 89 mmol/L — AB (ref 101–111)
CO2: 32 mmol/L (ref 22–32)
CO2: 29 mmol/L (ref 22–32)
Calcium: 8.5 mg/dL — ABNORMAL LOW (ref 8.9–10.3)
Chloride: 92 mmol/L — ABNORMAL LOW (ref 101–111)
Creatinine, Ser: 3.84 mg/dL — ABNORMAL HIGH (ref 0.61–1.24)
Creatinine, Ser: 3.79 mg/dL — ABNORMAL HIGH (ref 0.61–1.24)
GFR calc Af Amer: 19 mL/min — ABNORMAL LOW (ref >60)
GFR calc non Af Amer: 17 mL/min — ABNORMAL LOW (ref >60)
GFR calc non Af Amer: 17 mL/min — ABNORMAL LOW (ref >60)
GFR, EST AFRICAN AMERICAN: 19 mL/min — AB (ref >60)
GLUCOSE: 82 mg/dL (ref 65–99)
Glucose, Bld: 94 mg/dL (ref 65–99)
POTASSIUM: 4 mmol/L (ref 3.5–5.1)
POTASSIUM: 3.7 mmol/L (ref 3.5–5.1)
Phosphorus: 5.2 mg/dL — ABNORMAL HIGH (ref 2.5–4.6)
Phosphorus: 5.5 mg/dL — ABNORMAL HIGH (ref 2.5–4.6)
SODIUM: 132 mmol/L — AB (ref 135–145)
Sodium: 135 mmol/L (ref 135–145)

## 2017-05-23 LAB — CBC
HEMATOCRIT: 27.2 % — AB (ref 39.0–52.0)
HEMOGLOBIN: 8.2 g/dL — AB (ref 13.0–17.0)
MCH: 26.3 pg (ref 26.0–34.0)
MCHC: 30.1 g/dL (ref 30.0–36.0)
MCV: 87.2 fL (ref 78.0–100.0)
Platelets: 298 10*3/uL (ref 150–400)
RBC: 3.12 MIL/uL — AB (ref 4.22–5.81)
RDW: 21.8 % — ABNORMAL HIGH (ref 11.5–15.5)
WBC: 18.3 10*3/uL — ABNORMAL HIGH (ref 4.0–10.5)

## 2017-05-23 LAB — MAGNESIUM: Magnesium: 1.8 mg/dL (ref 1.7–2.4)

## 2017-05-23 LAB — LACTATE DEHYDROGENASE: LDH: 204 U/L — AB (ref 98–192)

## 2017-05-23 LAB — PROTIME-INR
INR: 2.18
PROTHROMBIN TIME: 24.1 s — AB (ref 11.4–15.2)

## 2017-05-23 MED ORDER — MAGNESIUM SULFATE IN D5W 1-5 GM/100ML-% IV SOLN
1.0000 g | Freq: Once | INTRAVENOUS | Status: AC
  Administered 2017-05-23: 1 g via INTRAVENOUS
  Filled 2017-05-23: qty 100

## 2017-05-23 MED ORDER — WARFARIN SODIUM 2 MG PO TABS
2.0000 mg | ORAL_TABLET | Freq: Once | ORAL | Status: AC
  Administered 2017-05-23: 2 mg via ORAL
  Filled 2017-05-23: qty 1

## 2017-05-23 NOTE — Progress Notes (Addendum)
Patient ID: David [name], male   DOB: November 05, 1965, 52 y.o.   MRN: [id number]   Advanced Heart Failure VAD Team Note  Subjective:    David [name] is a 52 y.o. male with a PMH of ICM ('09 PCI to LAD, mRCA, '10 PCI to Lcx and '12 mRCA), HFrEF (EF ~20% for >5 years), HLD and HTN admitted 12/26 for VAD placement due to end-stage systolic HF.   - Underwent successful HM-3 LVAD placement 04/20/17. 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 CVVHD started 12/30 - Started on Meropenem 04/27/16 with + Achromobacter in tracheal aspirate. (Finished 05/07/17) - CVVHD stopped 1/10 - 1st iHD 1/12 - Tunneled HD cath placed 05/08/17 - Keflex for mild drainage from Driveline 7/35/32 switched augmentin. Completed augmentin 1/23 - Started linezolid 1/25 for ?cellulitis -> to 06/02/17  Feels good today. Denies SOB. Still with some chest soreness.  No fevers. WBC coming down slowly on linazolid. Now 18.3. Creatinine slightly improved today. Good urine output ~ 2.4L    LVAD INTERROGATION:  HeartMate III LVAD:  Flow 4.6 liters/min, speed 5400, power 4.0, PI 5.1. Occasional PI events.   Objective:    MAP 80-90   Vital Signs:   Temp:  [97.5 F (36.4 C)-98.2 F (36.8 C)] 97.5 F (36.4 C) (01/29 0410) Pulse Rate:  [82-107] 82 (01/29 0700) Resp:  [13-19] 19 (01/29 0411) BP: (69-111)/(50-75) 74/50 (01/29 0411) SpO2:  [90 %-100 %] 98 % (01/29 0700) Weight:  [64.7 kg (142 lb 10.2 oz)] 64.7 kg (142 lb 10.2 oz) (01/29 0410) Last BM Date: 05/20/17   Intake/Output:   Intake/Output Summary (Last 24 hours) at 05/23/2017 1209 Last data filed at 05/23/2017 0900 Gross per 24 hour  Intake 462 ml  Output 2050 ml  Net -1588 ml   Physical Exam   General:  NAD.  HEENT: normal edentuolous Neck: supple. JVP 6-7  Carotids 2+ bilat; no bruits. No lymphadenopathy or thryomegaly appreciated. Cor: LVAD hum.  Lungs: Clear. Abdomen: obese soft, nontender, non-distended. No hepatosplenomegaly. No bruits or masses. Good bowel  sounds. Driveline site clean. Anchor in place.  Extremities: no cyanosis, clubbing, rash. Warm no edema  Neuro: alert & oriented x 3. No focal deficits. Moves all 4 without problem     Telemetry   NSR 80s, personally reviewed.   Labs   Basic Metabolic Panel: Recent Labs  Lab 05/19/17 0246  05/20/17 9924  05/21/17 0458 05/21/17 1502 05/22/17 0816 05/22/17 1527 05/23/17 0309  NA 135   < > 132*   < > 132* 133* 136 133* 135  K 3.4*   < > 3.1*   < > 3.5 3.9 4.1 4.0 4.0  CL 96*   < > 91*   < > 90* 91* 93* 91* 92*  CO2 28   < > 28   < > 28 30 32 30 32  GLUCOSE 93   < > 81   < > 81 99 73 104* 82  BUN 10   < > 16   < > 20 22* 25* 25* 26*  CREATININE 2.65*   < > 3.44*   < > 3.90* 3.91* 4.11* 3.90* 3.84*  CALCIUM 8.4*   < > 8.5*   < > 8.5* 8.5* 8.6* 8.3* 8.5*  MG 1.7  --  1.8  --  1.6*  --  1.7  --  1.8  PHOS 2.4*   < > 4.7*   < > 6.4* 6.2* 6.4* 5.8* 5.2*   < > =  values in this interval not displayed.    Liver Function Tests: Recent Labs  Lab 05/21/17 0458 05/21/17 1502 05/22/17 0816 05/22/17 1527 05/23/17 0309  ALBUMIN 1.6* 1.7* 1.7* 1.7* 1.7*   No results for input(s): LIPASE, AMYLASE in the last 168 hours. No results for input(s): AMMONIA in the last 168 hours.  CBC: Recent Labs  Lab 05/19/17 0246 05/20/17 0649 05/21/17 0458 05/22/17 0816 05/23/17 0309  WBC 24.0* 20.8* 20.6* 19.0* 18.3*  NEUTROABS  --  14.4*  --   --   --   HGB 8.7* 8.5* 8.5* 8.7* 8.2*  HCT 27.1* 27.8* 26.6* 28.9* 27.2*  MCV 86.0 86.3 86.1 87.3 87.2  PLT 345 334 317 317 298    INR: Recent Labs  Lab 05/19/17 0246 05/20/17 0649 05/21/17 0458 05/22/17 0816 05/23/17 0309  INR 2.99 2.55 2.38 2.19 2.18    Other results:  Imaging   No results found.   Medications:     Scheduled Medications: . aspirin EC  81 mg Oral Daily  . atorvastatin  40 mg Oral q1800  . chlorhexidine  15 mL Mouth Rinse BID  . darbepoetin (ARANESP) injection - DIALYSIS  200 mcg Intravenous Q Thu-HD  .  feeding supplement  1 Container Oral BID BM  . ferrous [id number] C-folic acid  1 capsule Oral BID BM  . linezolid  600 mg Oral Q12H  . mouth rinse  15 mL Mouth Rinse q12n4p  . pantoprazole  40 mg Oral Daily  . polyethylene glycol  17 g Oral Daily  . potassium chloride  20 mEq Oral Daily  . sevelamer carbonate  800 mg Oral TID WC  . torsemide  40 mg Oral Daily  . warfarin  2 mg Oral ONCE-1800  . Warfarin - Pharmacist Dosing Inpatient   Does not apply q1800    Infusions: . sodium chloride Stopped (04/30/17 0800)  . ferric gluconate (FERRLECIT/NULECIT) IV Stopped (05/18/17 1106)  . magnesium sulfate 1 - 4 g bolus IVPB      PRN Medications: heparin, levalbuterol, ondansetron (ZOFRAN) IV, oxyCODONE, simethicone, sodium chloride flush, sodium chloride flush   Patient Profile   David [name] is a 52 y.o. male with a PMH of ICM ('09 PCI to LAD, mRCA, '10 PCI to Lcx and '12 mRCA), HFrEF (EF ~20% for >5 years) s/p HM-III VAD placement on 12/27 due to end-stage systolic HF.   Assessment/Plan:    1. Acute on chronic systolic HF: Echo 34/19/37 with LVEF 20-25%, Mild MR, Mild/Mod MR, Severe LAE, Mildly reduced RV, PA peak pressure 56 mm Hg.  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 Extubated, initially stopped epinephrine/norepinephrine but restarted with fall in UOP and MAP.  With progressive renal failure, pulmonary edema, and hypotension, he was re-intubated on 12/30.   CVVH was begun.  12/31 was difficult day with refractory shock (MAP in 60s).  Reviewed 12/31 echo which showed no significant pericardial effusion, adequate RV function, and IV septum near mid-line. Off all pressors. Stable. - Volume status looks OK on exam. Continue torsemide 40 daily. No need for HD - Continue aspirin 81 mg daily.  - Warfarin goal INR 2-2.5. INR 2.18. Discussed dosing with PharmD personally. - VAD interrogated personally. Parameters stable. 2. CAD s/p PCI to mid  RCA and PLOM with DES x 2 and DES to mid LAD in 1/18.   - No s/s ischemia. Continue ASA and statin. No change.  3. PAF:  - Remains in NSR.  4.  AKI on CKD stage III: Suspect some degree of intrinsic renal disease prior to LVAD placement (baseline creatinine around 1.5) with probable ATN from intra-op/peri-op hypotension and development of vasodilatory/septic shock.  CVVHD stopped 05/04/2017. First iHD 05/06/17 without problems.Tunneled HD cath placed 05/08/17.  Vein mapping done plan for AV graft placement if no recovery.  Last HD on 1/24.   - No HD since last week. Volume status ok. Creatinine coming down.  - If continues to improve hopefully can pull perm-cath next week 5. Anemia: s/p 1 u PRBCs 12/30, 04/25/17, 1/8.  - Hgb at 8.2 - Continue aranesp.  - Want to limit transfusion with possibility of transplant down the road. Follow closely.  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 ID: Started on cefepime empirically with septic/vasodilatory shock, PCT was 31 04/25/17.  ABX broadened to Meropenem 04/27/16 with + Achromobacter on tracheal aspirate. Finished 05/07/17 (10 days).  WBCs rising more recently, started on linezolid 1/25 for ?cellulitis around driveline.   - WBCs 89.3-> 18.3 today. Afebrile.  - Will complete linezolid 06/02/17. D/w ID today at bedside - Hopefully can pull perm-cath soon.  7. Elevated LFTs due to shock liver.   - LFTs improved but albumin low. Suspect compromised liver synthetic function. Encouraged protein intake. 8. Chest/pocket Pain:  - Continue gabapentin to 300 mg BID.  9. Protein-calorie malnutrition - Encouraged po intake and activity. Check pre-albumin in am.   Length of Stay: [place], MD 05/23/2017, 12:09 PM  VAD Team --- VAD ISSUES ONLY--- Pager [phone] (7am - 7am) Advanced Heart Failure Team  Pager [phone] (M-F; 7a - 4p)  Please contact [facility] for night-coverage after hours (4p -7a ) and weekends on [email or web address]  Patient seen  and examined with the above-signed Advanced Practice Provider and/or Housestaff. I personally reviewed laboratory data, imaging studies and relevant notes. I independently examined the patient and formulated the important aspects of the plan. I have edited the note to reflect any of my changes or salient points. I have personally discussed the plan with the patient and/or family.  Feels weak today. ? Mild uremia but BUN only 25. Possibly related at Zyvox?  Volume status ok. Has been off HD since Thursday. Creatinine up only marginally. Would continue to hold off HD for now if possible. Limit pain meds. Encourage walking and po intake.  WBC decreasing slowly. No fevers or chills.  Will need to work with him and Renal to discuss disposition date. Suspect we will need to arrange f/u for outpatient HD to facilitate discharge but hopefully will continue to have more renal recovery.    INR 2.19 today. Discussed dosing with PharmD personally.  VAD interrogated personally. Parameters stable.  Glori Bickers, MD  12:09 PM

## 2017-05-23 NOTE — Progress Notes (Signed)
LVAD Coordinator Rounding Note:  Admitted12/26due to decompensated heart failure.  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  Pt states he feels well today and slept good last night.   Vital signs: Tmax: 98.2 HR:70 Doppler Pressure: 90 Automatic cuff: not done O2 Sat: 98 % on2 L/ Wt:167>168>167>158>157>154>151>152>150>146>148>145>142 lbs  LVAD interrogation reveals:  Speed:5400 Flow: 5.4 Power:4.0w PI:2.4 Alarms:none Events:13 PI events Hematocrit:27 Fixed speed:5400 Low speed limit:5100  Drive line site: dressing dry and intact; anchor intact and accurately applied. VAD Coordinator will change dressing later today.  Labs:  LDH trend:376>355>315>265>263>281>293>224>231>259>242>240>209>207>204  INRtrend:1.49>1.63>1.99>1.97>2.52>3.72>4.60>4.76>3.81>3.33>2.99>2.55>2.38>2.19>2.18  Anticoagulation Plan: -INR Goal:2-2.5 -ASA Dose: 81 mg   Blood Products: Intra op:2 units/FFP 04/20/17  Post AJ:GOTL'X 1 unit 12/29 1 unit 12/30 1 unit 04/25/17 1 unit 05/02/17 1 unit 05/08/17  Device:Medtronic BiV -Therapies:on  Arrhythmia: 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 (on amiodarone and Eliquis for this) - Afib 12/29 - 04/28/17 - NSR 1/5 - present  Nitric Oxide: - stopped on 12/30 due to initiation of BiPap  Respiratory: - Extubated 12/28 - Reintubated 04/23/2017 - Extubated 04/26/2017  Renal:  04/23/17 - CRRT started for AKI - stopped 05/05/17 -Intermittent HD Monday, Wednesday, Friday   Infection: - 04/27/17 + 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 2. VAD education to be completed at [facility] February 5th. 3. VAD coordinator will change dressing this afternoon as wife is not coming today.   Tanda Rockers RN, VAD Coordinator 24/7 pager [phone]

## 2017-05-23 NOTE — Progress Notes (Signed)
Patient ID: David [name], male   DOB: 1965-12-09, 52 y.o.   MRN: [id number] HeartMate 3 Rounding Note  Subjective:    Feels ok but some nausea today.  Excellent urine output on Demadex 40 daily. -2L yesterday and weight down 2-3 lbs again.  LVAD INTERROGATION:  HeartMate IIl LVAD:  Flow 4.6 liters/min, speed 5400, power 4, PI 4.5   Objective:    Vital Signs:   Temp:  [97.5 F (36.4 C)-97.9 F (36.6 C)] 97.5 F (36.4 C) (01/29 0410) Pulse Rate:  [82-107] 82 (01/29 1200) Resp:  [13-19] 19 (01/29 0411) BP: (69-90)/(50-75) 74/50 (01/29 0411) SpO2:  [90 %-100 %] 93 % (01/29 1200) Weight:  [64.7 kg (142 lb 10.2 oz)] 64.7 kg (142 lb 10.2 oz) (01/29 0410) Last BM Date: 05/20/17 Mean arterial Pressure 80's  Intake/Output:   Intake/Output Summary (Last 24 hours) at 05/23/2017 1358 Last data filed at 05/23/2017 1218 Gross per 24 hour  Intake 562 ml  Output 2450 ml  Net -1888 ml     Physical Exam: General:  Looks comfortable,   No resp difficulty Cor:  LVAD hum present. Lungs: clear Chest incision well-healed Abdomen: soft, nontender, nondistended. Good bowel sounds. Abdominal incision well-healed. No tenderness over drive line. Extremities: no LE edema. TED stockings on Neuro: alert & orientedx3,  moves all 4 extremities w/o difficulty. Affect pleasant Driveline dressing to be done by VAD coordinator.  Telemetry: sinus rhythm 80's  Labs: Basic Metabolic Panel: Recent Labs  Lab 05/19/17 0246  05/20/17 0649  05/21/17 0458 05/21/17 1502 05/22/17 0816 05/22/17 1527 05/23/17 0309  NA 135   < > 132*   < > 132* 133* 136 133* 135  K 3.4*   < > 3.1*   < > 3.5 3.9 4.1 4.0 4.0  CL 96*   < > 91*   < > 90* 91* 93* 91* 92*  CO2 28   < > 28   < > 28 30 32 30 32  GLUCOSE 93   < > 81   < > 81 99 73 104* 82  BUN 10   < > 16   < > 20 22* 25* 25* 26*  CREATININE 2.65*   < > 3.44*   < > 3.90* 3.91* 4.11* 3.90* 3.84*  CALCIUM 8.4*   < > 8.5*   < > 8.5* 8.5* 8.6* 8.3* 8.5*  MG 1.7  --   1.8  --  1.6*  --  1.7  --  1.8  PHOS 2.4*   < > 4.7*   < > 6.4* 6.2* 6.4* 5.8* 5.2*   < > = values in this interval not displayed.    Liver Function Tests: Recent Labs  Lab 05/21/17 0458 05/21/17 1502 05/22/17 0816 05/22/17 1527 05/23/17 0309  ALBUMIN 1.6* 1.7* 1.7* 1.7* 1.7*   No results for input(s): LIPASE, AMYLASE in the last 168 hours. No results for input(s): AMMONIA in the last 168 hours.  CBC: Recent Labs  Lab 05/19/17 0246 05/20/17 0649 05/21/17 0458 05/22/17 0816 05/23/17 0309  WBC 24.0* 20.8* 20.6* 19.0* 18.3*  NEUTROABS  --  14.4*  --   --   --   HGB 8.7* 8.5* 8.5* 8.7* 8.2*  HCT 27.1* 27.8* 26.6* 28.9* 27.2*  MCV 86.0 86.3 86.1 87.3 87.2  PLT 345 334 317 317 298    INR: Recent Labs  Lab 05/19/17 0246 05/20/17 0649 05/21/17 0458 05/22/17 0816 05/23/17 0309  INR 2.99 2.55 2.38 2.19 2.18    Other  results:  EKG:   Imaging: No results found.   Medications:     Scheduled Medications: . aspirin EC  81 mg Oral Daily  . atorvastatin  40 mg Oral q1800  . chlorhexidine  15 mL Mouth Rinse BID  . darbepoetin (ARANESP) injection - DIALYSIS  200 mcg Intravenous Q Thu-HD  . feeding supplement  1 Container Oral BID BM  . ferrous [id number] C-folic acid  1 capsule Oral BID BM  . linezolid  600 mg Oral Q12H  . mouth rinse  15 mL Mouth Rinse q12n4p  . pantoprazole  40 mg Oral Daily  . polyethylene glycol  17 g Oral Daily  . sevelamer carbonate  800 mg Oral TID WC  . warfarin  2 mg Oral ONCE-1800  . Warfarin - Pharmacist Dosing Inpatient   Does not apply q1800    Infusions: . sodium chloride Stopped (04/30/17 0800)  . ferric gluconate (FERRLECIT/NULECIT) IV Stopped (05/18/17 1106)    PRN Medications: heparin, levalbuterol, ondansetron (ZOFRAN) IV, oxyCODONE, simethicone, sodium chloride flush, sodium chloride flush   Assessment:   1. Ischemic cardiomyopathy s/p multiple PCI's with chronic systolic heart failure and EF 20% on home  dobutamine preop with significant improvement in symptoms. Evaluated at [facility] for heart transplant but LVAD felt to be the best treatment for him at this time. POD32s/p HeartMate III LVAD. He was critically ill with multi-system organ failure but has continued to recover and only issue now is renal failure requiring HD. Hemodynamics and VAD parameters very stable 2.H/OHypertension 3.H/OHyperlipidemia 4. PAF in sinuspreopon amiodarone and Eliquis.Now back in sinus off amiodarone.  5.H/OStage III CKD. Acute on chronic postop renal failureon intermittent HD. Making good urine with Demedex but he has been negative 1.5-2.5 L daily and I am concerned about getting volume depleted and affecting his kidney recovery. Will stop Demedex for now and follow. Creat down slightly.  Holding off on further HD for now to see how kidneys support him.  6. LBBB s/p CRT-D upgrade in 10/2016 without improvement in symptoms or EF. LV epicardial leads cut off at the time of surgery. 7. Prior smoking with mild obstruction and severe diffusion defect on PFT's. Continue IS. 8. Postop leukocytosis and fever. Now on Zyvox.  The only concerning source would be dialysis catheter infection. BC negative from 1/25. Afebrile and feels fine. I would plan to remove HD catheter tomorrow if creat stable. 9. Marked rise in bilirubin and transaminases. Likely due to pump runand right sided congestion, hypotension.Resolved   10. INR therapeutic.  Coumadin per pharmacy.     11. Severe malnutrition with albumin 1.6-1.7. He is eating fairly well now.  Continue to encourage nutrition. Nausea today that may be related to Zyvox.    12. PT    13. Anemia: follow. He is on iron.           Plan/Discussion:    Overall he has been very stable. MAP in good range.  VAD flow has been great. Holding off on further HD for now. DC Demedex and follow I/O, creat. Continue Zyvox for leukocytosis of undetermined etiology.  Remove HD catheter  tomorrow if creat stable. Continue nutrition, ambulation, IS.  I reviewed the LVAD parameters from today, and compared the results to the patient's prior recorded data.  No programming changes were made.  The LVAD is functioning within specified parameters.   LVAD interrogation was negative for any significant power changes, alarms or PI events/speed drops.  LVAD equipment check completed and is in  good working order.  Back-up equipment present.   LVAD education done on emergency procedures and precautions and reviewed exit site care.    Gaye Pollack 05/23/2017, 1:58 PM

## 2017-05-23 NOTE — Progress Notes (Signed)
Exit Site Care:  VAD dressing removed and site care performed using sterile technique. Drive line exit site cleaned with Chlora prep applicators x 2, allowed to dry, and gauze dressing with aquacel silver strip re-applied. Exit site with partial tissue ingrowth, the velour is fully implanted at exit site. Large scab around drive line. Slight erythema around exit site, no tenderness, drainage, foul odor, or rash noted. Drive line anchor in place and accurately applied.    Tanda Rockers RN, BSN VAD Coordinator 24/7 Pager [phone]

## 2017-05-23 NOTE — Progress Notes (Signed)
Nutrition Follow-up  DOCUMENTATION CODES:   Not applicable  INTERVENTION:   -Continue Boost Breeze po BID, each supplement provides 250 kcal and 9 grams of protein  NUTRITION DIAGNOSIS:   Increased nutrient needs related to (LVAD) as evidenced by estimated needs.  Ongoing  GOAL:   Patient will meet greater than or equal to 90% of their needs  Progressing  MONITOR:   PO intake, Supplement acceptance, Labs, Weight trends, I & O's  REASON FOR ASSESSMENT:   Consult Assessment of nutrition requirement/status  ASSESSMENT:   Pt with PMH of HLD, HTN, end-stage CHF admitted for HM-3 LVAD placement 12/27. Extubated 12/28, pt with hypotension and poor UOP CRRT started 12/30. Pt re-intubated 12/30 with worsening acidosis and respiratory distress. Extubated 12/31.   Pt sleeping soundly at time of visit. Noted Boost Breeze at bedside, unopened. Pt also with cup of water and 1/2 bottle of fruit punch at bedside.   Case discussed with RN, who reports stable intake. Noted meal completion 25-100%. Pt continues to enjoys Boost Breeze supplements.   Per nephrology and RNCM notes, pt with some renal recovery and clipping process on hold. Last HD 05/18/17. Diuretics on hold.   Pt -2 L x 24 hours and -14 L since admission.   Labs reviewed.   Diet Order:  Diet Heart Room service appropriate? Yes; Fluid consistency: Thin  EDUCATION NEEDS:   No education needs have been identified at this time  Skin:  Skin Assessment: Skin Integrity Issues: Skin Integrity Issues:: Incisions Incisions: sternum and abdomen  Last BM:  05/20/17  Height:   Ht Readings from Last 1 Encounters:  05/11/17 5\' 5"  (1.651 m)    Weight:   Wt Readings from Last 1 Encounters:  05/23/17 142 lb 10.2 oz (64.7 kg)    Ideal Body Weight:  61.8 kg  BMI:  Body mass index is 23.74 kg/m.  Estimated Nutritional Needs:   Kcal:  2100-2300  Protein:  105-120 grams  Fluid:  >1.5 L/day    Masiah Lewing A. Jimmye Norman,  RD, LDN, CDE Pager: [phone] After hours Pager: [phone]

## 2017-05-23 NOTE — Progress Notes (Signed)
[facility] for Coumadin Indication: LVAD  Allergies  Allergen Reactions  . Plavix [Clopidogrel Bisulfate] Hives   Patient Measurements: Height: 5\' 5"  (165.1 cm) Weight: 142 lb 10.2 oz (64.7 kg) IBW/kg (Calculated) : 61.5  Vital Signs: Temp: 97.5 F (36.4 C) (01/29 0410) Temp Source: Oral (01/29 0410) BP: 74/50 (01/29 0411) Pulse Rate: 82 (01/29 0700)  Labs: Recent Labs    05/21/17 0458  05/22/17 0816 05/22/17 1527 05/23/17 0309  HGB 8.5*  --  8.7*  --  8.2*  HCT 26.6*  --  28.9*  --  27.2*  PLT 317  --  317  --  298  LABPROT 25.8*  --  24.2*  --  24.1*  INR 2.38  --  2.19  --  2.18  CREATININE 3.90*   < > 4.11* 3.90* 3.84*   < > = values in this interval not displayed.   Estimated Creatinine Clearance: 19.4 mL/min (A) (by C-G formula based on SCr of 3.84 mg/dL (H)).  Assessment: 52 yo male s/p LVAD implantation 12/27. Warfarin on hold with plan for possible HD graft next week, will need heparin bridge once INR <2.0.  Okay with cardiology to resume warfarin. INR now down to 2.18 from 2.19 this morning, CBC stable. No signs/symptoms of bleeding.   Goal of Therapy:  INR 2-2.5 Monitor platelets by anticoagulation protocol: Yes   Plan: -Warfarin 2 mg tonight -Daily PT/INR  Doylene Canard, PharmD Clinical Pharmacist  Pager: [phone] Clinical Phone for 05/23/2017 until 3:30pm: [phone] If after 3:30pm, please call main pharmacy at [phone]  05/23/2017 10:19 AM

## 2017-05-23 NOTE — Progress Notes (Signed)
CARDIAC REHAB PHASE I   PRE:  Rate/Rhythm: 83 SR  BP:  Supine:   Sitting: 96 MAP  Standing:    SaO2: 96% 2L  MODE:  Ambulation: 1760 ft   POST:  Rate/Rhythm: 99 SR  BP:  Supine:   Sitting: 100 MAP  Standing:    SaO2: 92% 2L 1330-1428 Pt only needed one cue. He started to disconnect without battery being in clip and ready for exchange. After that reminder, he was able to put on batteries and back to power source without cues. Checked anchor before sitting up to make sure driveline secure. Pt walked 1760 ft on 2L with rollator independently with steady gait. Did not need to rest. Denied SOB. Tolerated well. Back to bed after walk. Answered questions correctly.   David Good, RN BSN  05/23/2017 2:24 PM

## 2017-05-23 NOTE — Progress Notes (Signed)
79K with systolic CHF s/p LVAD 24/09 with AKI On CKD-->ESRD, ?new-start HD.   1. AKI on CKD III-->ESRD: likely due to ATN from shock s/p LVAD placement with underlying arterionephrosclerosis. Required CRRT initially - transitioned to IHD (1/11). S/p TDC with IR 05/08/17. UOP good (1200 yest) but crt inc from 2.11 ->3.11->3.4->3.6->3.9> 4.11off of HD, s/p vein mapping 05/12/17 (Working on CLIP to OP unit- lives in [place]- need to make sure they can take LVAD pt if he is to continue HD). No HD needs today.WIll monitor for recovery. D/W Dr Cyndia Bent will hold diuretics.    2. Acute on chronic systolic CHF: s/p Heartmate III 12/27. Advanced HF and cardiothoracic surgery following. Coumadin has been restarted (1/14).  3. Driveline drainage: On zyvox now per ID   4. Anemia- iron stores low, giving iron - on darbe 200 q week  Subjective: Interval History: Weight down  Objective: Vital signs in last 24 hours: Temp:  [97.5 F (36.4 C)-97.9 F (36.6 C)] 97.5 F (36.4 C) (01/29 0410) Pulse Rate:  [82-107] 82 (01/29 1200) Resp:  [13-19] 19 (01/29 0411) BP: (69-90)/(50-75) 74/50 (01/29 0411) SpO2:  [90 %-100 %] 93 % (01/29 1200) Weight:  [64.7 kg (142 lb 10.2 oz)] 64.7 kg (142 lb 10.2 oz) (01/29 0410) Weight change: -1.1 kg (-6.8 oz)  Intake/Output from previous day: 01/28 0701 - 01/29 0700 In: 342 [P.O.:342] Out: 2425 [Urine:2425] Intake/Output this shift: Total I/O In: 220 [P.O.:120; IV Piggyback:100] Out: 400 [Urine:400]  General appearance: alert and cooperative Resp: clear to auscultation bilaterally and hum Ext no edema  Lab Results: Recent Labs    05/22/17 0816 05/23/17 0309  WBC 19.0* 18.3*  HGB 8.7* 8.2*  HCT 28.9* 27.2*  PLT 317 298   BMET:  Recent Labs    05/22/17 1527 05/23/17 0309  NA 133* 135  K 4.0 4.0  CL 91* 92*  CO2 30 32  GLUCOSE 104* 82  BUN 25* 26*  CREATININE 3.90* 3.84*  CALCIUM 8.3* 8.5*   No results for input(s): PTH in the  last 72 hours. Iron Studies: No results for input(s): IRON, TIBC, TRANSFERRIN, FERRITIN in the last 72 hours. Studies/Results: No results found.  Scheduled: . aspirin EC  81 mg Oral Daily  . atorvastatin  40 mg Oral q1800  . chlorhexidine  15 mL Mouth Rinse BID  . darbepoetin (ARANESP) injection - DIALYSIS  200 mcg Intravenous Q Thu-HD  . feeding supplement  1 Container Oral BID BM  . ferrous [id number] C-folic acid  1 capsule Oral BID BM  . linezolid  600 mg Oral Q12H  . mouth rinse  15 mL Mouth Rinse q12n4p  . pantoprazole  40 mg Oral Daily  . polyethylene glycol  17 g Oral Daily  . potassium chloride  20 mEq Oral Daily  . sevelamer carbonate  800 mg Oral TID WC  . warfarin  2 mg Oral ONCE-1800  . Warfarin - Pharmacist Dosing Inpatient   Does not apply q1800    LOS: 34 days   Estanislado Emms 05/23/2017,1:13 PM

## 2017-05-23 NOTE — Care Management Note (Signed)
Case Management Note   Patient Details  Name: David [name] MRN: [id number] Date of Birth: 12-02-1965  Subjective/Objective:  S/p LVAD implant 12/27, conts on dobutamine, co - x 81%, sob this am, urine output is less than 100cc in 24 hrs, CRRT was stopped on 1/10, transitioning to IHD, to place picc. He was already active with [facility] , will resume for PT and OT , but will need to see if still needs rollator at dc.     1/14 [name] RN, BSN- tunneled cathter placed today, bleeding around VAD drive site, hepain on hold, he had his first dialysis on 1/12,will need IHD, has minimal urine output, conts on dobutamine.  Remains hopeful to regain renal function but looking slimmer per MD notes.  1/16 [name] RN, BSN - POD 21 LVAD implant, conts with IHD, dobutamie off, hgb 7.8, conts on heaprin , lasix iv x 1 .  Discussed in LOS.  Patient is active with [facility] , he would like to stay with them when he goes home with [facility] for PT/OT, will need resume orders at that time.                     Action/Plan: PTA Pt lived at home with spouse- was active with [facility] for home Dobutamine-HH services- PCP- Gilford Rile-  CM will follow post VAD implant for transition needs- anticipate return home with wife.      Expected Discharge Date:                  Expected Discharge Plan:  [place]  In-House Referral:     Discharge planning Services  CM Consult  Post Acute Care Choice:  Home Health, Resumption of Svcs/PTA Provider Choice offered to:  Patient  DME Arranged:    DME Agency:     HH Arranged:  RN, PT, OT [place] Agency:  [place]  Status of Service:  In process, will continue to follow  If discussed at Long Length of Stay Meetings, dates discussed:    Additional Comments: 05/23/2017  Pt now has some possible renal recovery - Clipping process on hold.    05/18/17 Vein mapping done plan for AV graft placement if no recovery. Clipping process  initated  05/17/17 CM ordered rollator from [facility].  [facility] aware that orders are in for [facility].   Pt is off all drips Maryclare Labrador, RN 05/23/2017, 2:55 PM

## 2017-05-23 NOTE — Progress Notes (Signed)
Physical Therapy Treatment Patient Details Name: David [name] MRN: [id number] DOB: 1965-08-04 Today's Date: 05/23/2017    History of Present Illness This 52 y.o. male admitted for LVAD. Pt extubated 12/28.   on 12/30, pt developed respiratory distress with pulmonary edema, and increased creatanine.  He was intubated 12/30 > 04/26/16, and CRRT initiated 12/30 and discontinued 1/10. Pt now on intermittent HD.   PMH includes:  Acute on chronic systolic HF, CAD, PAF, CKD stage III, LBBB, frequent PVCs, seizures, PNS, MI, ischemic cardiomyopathy, s/p AICD     PT Comments    Pt performed power source transfer in seated position.  He was sitting on drive line in supine and required re-education to be aware of drive line.  Pt continues to perform power source transfer in a more competent manner.  Plan next session for curb training.      Follow Up Recommendations  Supervision/Assistance - 24 hour;Home health PT     Equipment Recommendations  Other (comment)(rollator)    Recommendations for Other Services       Precautions / Restrictions Precautions Precautions: Fall;Sternal Precaution Comments: remphasized the need to be aware of drive line with bed mobility Restrictions Weight Bearing Restrictions: No Other Position/Activity Restrictions: sternal precautions    Mobility  Bed Mobility Overal bed mobility: Modified Independent Bed Mobility: Supine to Sit;Sit to Supine     Supine to sit: Modified independent (Device/Increase time) Sit to supine: Modified independent (Device/Increase time)   General bed mobility comments: Pt performed without assistance and used good safety.    Transfers Overall transfer level: Needs assistance Equipment used: 4-wheeled walker(rollator) Transfers: Sit to/from Stand Sit to Stand: Supervision Stand pivot transfers: Supervision       General transfer comment: cues for use of breaks   Ambulation/Gait Ambulation/Gait assistance:  Supervision Ambulation Distance (Feet): 850 Feet Assistive device: 4-wheeled walker Gait Pattern/deviations: Step-through pattern Gait velocity: decr Gait velocity interpretation: Below normal speed for age/gender General Gait Details: Good safety cues for pacing, desat on 2L to 81%, required 3L to maintain O2 sats at 91%.     Stairs            Wheelchair Mobility    Modified Rankin (Stroke Patients Only)       Balance   Sitting-balance support: Feet supported;No upper extremity supported Sitting balance-Leahy Scale: Good       Standing balance-Leahy Scale: Fair Standing balance comment: With intermittent UE support.                              Cognition Arousal/Alertness: Awake/alert Behavior During Therapy: WFL for tasks assessed/performed Overall Cognitive Status: Impaired/Different from baseline Area of Impairment: Safety/judgement(at time unaware of drive line)                         Safety/Judgement: Decreased awareness of safety   Problem Solving: Difficulty sequencing;Requires verbal cues General Comments: pt in attentive to drive line and controller with bed mobility      Exercises      General Comments        Pertinent Vitals/Pain Pain Assessment: No/denies pain(Reports pain is good today.  )    Home Living                      Prior Function            PT Goals (current goals can now be found  in the care plan section) Acute Rehab PT Goals Patient Stated Goal: To get stronger and go home  Potential to Achieve Goals: Good Progress towards PT goals: Progressing toward goals    Frequency    Min 3X/week      PT Plan Current plan remains appropriate    Co-evaluation              AM-PAC PT "6 Clicks" Daily Activity  Outcome Measure  Difficulty turning over in bed (including adjusting bedclothes, sheets and blankets)?: None Difficulty moving from lying on back to sitting on the side of the  bed? : None Difficulty sitting down on and standing up from a chair with arms (e.g., wheelchair, bedside commode, etc,.)?: A Little Help needed moving to and from a bed to chair (including a wheelchair)?: A Little Help needed walking in hospital room?: A Little Help needed climbing 3-5 steps with a railing? : A Little 6 Click Score: 20    End of Session Equipment Utilized During Treatment: Oxygen(lvad back up bag) Activity Tolerance: Patient tolerated treatment well Patient left: with call bell/phone within reach;in bed Nurse Communication: Mobility status(patient awareness of drive line) PT Visit Diagnosis: Unsteadiness on feet (R26.81);Muscle weakness (generalized) (M62.81);Difficulty in walking, not elsewhere classified (R26.2)     Time: 9244-6286 PT Time Calculation (min) (ACUTE ONLY): 43 min  Charges:  $Gait Training: 23-37 mins $Therapeutic Activity: 8-22 mins                    G Codes:       [name], PTA pager [phone]    [name] 05/23/2017, 11:13 AM

## 2017-05-24 DIAGNOSIS — I5023 Acute on chronic systolic (congestive) heart failure: Secondary | ICD-10-CM

## 2017-05-24 DIAGNOSIS — Z95811 Presence of heart assist device: Secondary | ICD-10-CM

## 2017-05-24 LAB — CULTURE, BLOOD (ROUTINE X 2)
Culture: NO GROWTH
Culture: NO GROWTH
Special Requests: ADEQUATE
Special Requests: ADEQUATE

## 2017-05-24 LAB — RENAL FUNCTION PANEL
ALBUMIN: 1.6 g/dL — AB (ref 3.5–5.0)
ANION GAP: 12 (ref 5–15)
Albumin: 1.7 g/dL — ABNORMAL LOW (ref 3.5–5.0)
Anion gap: 12 (ref 5–15)
BUN: 26 mg/dL — AB (ref 6–20)
BUN: 27 mg/dL — AB (ref 6–20)
CALCIUM: 8.2 mg/dL — AB (ref 8.9–10.3)
CALCIUM: 8.4 mg/dL — AB (ref 8.9–10.3)
CO2: 29 mmol/L (ref 22–32)
CO2: 30 mmol/L (ref 22–32)
CREATININE: 3.74 mg/dL — AB (ref 0.61–1.24)
CREATININE: 3.7 mg/dL — AB (ref 0.61–1.24)
Chloride: 92 mmol/L — ABNORMAL LOW (ref 101–111)
Chloride: 92 mmol/L — ABNORMAL LOW (ref 101–111)
GFR calc Af Amer: 20 mL/min — ABNORMAL LOW (ref >60)
GFR calc non Af Amer: 17 mL/min — ABNORMAL LOW (ref >60)
GFR, EST AFRICAN AMERICAN: 20 mL/min — AB (ref >60)
GFR, EST NON AFRICAN AMERICAN: 17 mL/min — AB (ref >60)
GLUCOSE: 107 mg/dL — AB (ref 65–99)
Glucose, Bld: 116 mg/dL — ABNORMAL HIGH (ref 65–99)
PHOSPHORUS: 5.6 mg/dL — AB (ref 2.5–4.6)
Phosphorus: 5.1 mg/dL — ABNORMAL HIGH (ref 2.5–4.6)
Potassium: 3.6 mmol/L (ref 3.5–5.1)
Potassium: 3.7 mmol/L (ref 3.5–5.1)
SODIUM: 134 mmol/L — AB (ref 135–145)
Sodium: 133 mmol/L — ABNORMAL LOW (ref 135–145)

## 2017-05-24 LAB — CBC
HCT: 26.6 % — ABNORMAL LOW (ref 39.0–52.0)
Hemoglobin: 8.3 g/dL — ABNORMAL LOW (ref 13.0–17.0)
MCH: 27.2 pg (ref 26.0–34.0)
MCHC: 31.2 g/dL (ref 30.0–36.0)
MCV: 87.2 fL (ref 78.0–100.0)
PLATELETS: 260 10*3/uL (ref 150–400)
RBC: 3.05 MIL/uL — ABNORMAL LOW (ref 4.22–5.81)
RDW: 21.3 % — AB (ref 11.5–15.5)
WBC: 16.3 10*3/uL — AB (ref 4.0–10.5)

## 2017-05-24 LAB — PREALBUMIN: PREALBUMIN: 14.4 mg/dL — AB (ref 18–38)

## 2017-05-24 LAB — PROTIME-INR
INR: 2.29
PROTHROMBIN TIME: 25 s — AB (ref 11.4–15.2)

## 2017-05-24 LAB — MAGNESIUM: MAGNESIUM: 2.1 mg/dL (ref 1.7–2.4)

## 2017-05-24 LAB — LACTATE DEHYDROGENASE: LDH: 184 U/L (ref 98–192)

## 2017-05-24 LAB — MRSA PCR SCREENING: MRSA BY PCR: NEGATIVE

## 2017-05-24 MED ORDER — WARFARIN SODIUM 1 MG PO TABS
1.5000 mg | ORAL_TABLET | Freq: Once | ORAL | Status: AC
  Administered 2017-05-24: 18:00:00 1.5 mg via ORAL
  Filled 2017-05-24 (×2): qty 1

## 2017-05-24 NOTE — Plan of Care (Signed)
Continue current care plan 

## 2017-05-24 NOTE — Progress Notes (Signed)
Patient ID: David [name], male   DOB: December 20, 1965, 52 y.o.   MRN: [id number] HeartMate 3 Rounding Note  Subjective:    Feels well today. Eating breakfast.  Still had excellent diuresis yesterday with -1540 cc and weight down another lb.  LVAD INTERROGATION:  HeartMate IIl LVAD:  Flow 5.0 liters/min, speed 5400, power 4, PI 4  Objective:    Vital Signs:   Temp:  [98 F (36.7 C)-98.2 F (36.8 C)] 98 F (36.7 C) (01/30 0756) Pulse Rate:  [82-90] 89 (01/30 0452) Resp:  [10-17] 10 (01/30 0452) BP: (86-98)/(64-87) 86/64 (01/30 0756) SpO2:  [93 %-99 %] 96 % (01/30 0452) Weight:  [141 lb 12.1 oz (64.3 kg)] 141 lb 12.1 oz (64.3 kg) (01/30 0452) Last BM Date: 05/22/17 Mean arterial Pressure 80's  Intake/Output:   Intake/Output Summary (Last 24 hours) at 05/24/2017 0925 Last data filed at 05/24/2017 0453 Gross per 24 hour  Intake 260 ml  Output 1800 ml  Net -1540 ml     Physical Exam: General:  Looks comfortable,   No resp difficulty Cor:  LVAD hum present. Lungs: clear Chest incision well-healed Abdomen: soft, nontender, nondistended. Good bowel sounds. Abdominal incision well-healed. No tenderness over drive line. Extremities: no LE edema. TED stockings on Neuro: alert & orientedx3,  moves all 4 extremities w/o difficulty. Affect pleasant Driveline dressing changed by VAD coordinator yesterday and looks good.  Telemetry: sinus rhythm 80's  Labs: Basic Metabolic Panel: Recent Labs  Lab 05/20/17 0649  05/21/17 0458  05/22/17 0816 05/22/17 1527 05/23/17 0309 05/23/17 1611 05/24/17 0257  NA 132*   < > 132*   < > 136 133* 135 132* 133*  K 3.1*   < > 3.5   < > 4.1 4.0 4.0 3.7 3.6  CL 91*   < > 90*   < > 93* 91* 92* 89* 92*  CO2 28   < > 28   < > 32 30 32 29 29  GLUCOSE 81   < > 81   < > 73 104* 82 94 116*  BUN 16   < > 20   < > 25* 25* 26* 25* 26*  CREATININE 3.44*   < > 3.90*   < > 4.11* 3.90* 3.84* 3.79* 3.74*  CALCIUM 8.5*   < > 8.5*   < > 8.6* 8.3* 8.5* 8.2* 8.2*   MG 1.8  --  1.6*  --  1.7  --  1.8  --  2.1  PHOS 4.7*   < > 6.4*   < > 6.4* 5.8* 5.2* 5.5* 5.6*   < > = values in this interval not displayed.    Liver Function Tests: Recent Labs  Lab 05/22/17 0816 05/22/17 1527 05/23/17 0309 05/23/17 1611 05/24/17 0257  ALBUMIN 1.7* 1.7* 1.7* 1.7* 1.6*   No results for input(s): LIPASE, AMYLASE in the last 168 hours. No results for input(s): AMMONIA in the last 168 hours.  CBC: Recent Labs  Lab 05/20/17 0649 05/21/17 0458 05/22/17 0816 05/23/17 0309 05/24/17 0257  WBC 20.8* 20.6* 19.0* 18.3* 16.3*  NEUTROABS 14.4*  --   --   --   --   HGB 8.5* 8.5* 8.7* 8.2* 8.3*  HCT 27.8* 26.6* 28.9* 27.2* 26.6*  MCV 86.3 86.1 87.3 87.2 87.2  PLT 334 317 317 298 260    INR: Recent Labs  Lab 05/20/17 0649 05/21/17 0458 05/22/17 0816 05/23/17 0309 05/24/17 0257  INR 2.55 2.38 2.19 2.18 2.29    Other results:  EKG:   Imaging: No results found.   Medications:     Scheduled Medications: . aspirin EC  81 mg Oral Daily  . atorvastatin  40 mg Oral q1800  . chlorhexidine  15 mL Mouth Rinse BID  . darbepoetin (ARANESP) injection - DIALYSIS  200 mcg Intravenous Q Thu-HD  . feeding supplement  1 Container Oral BID BM  . ferrous [id number] C-folic acid  1 capsule Oral BID BM  . mouth rinse  15 mL Mouth Rinse q12n4p  . pantoprazole  40 mg Oral Daily  . polyethylene glycol  17 g Oral Daily  . sevelamer carbonate  800 mg Oral TID WC  . Warfarin - Pharmacist Dosing Inpatient   Does not apply q1800    Infusions: . sodium chloride Stopped (04/30/17 0800)  . ferric gluconate (FERRLECIT/NULECIT) IV Stopped (05/18/17 1106)    PRN Medications: heparin, levalbuterol, ondansetron (ZOFRAN) IV, oxyCODONE, simethicone, sodium chloride flush, sodium chloride flush   Assessment:   1. Ischemic cardiomyopathy s/p multiple PCI's with chronic systolic heart failure and EF 20% on home dobutamine preop with significant improvement in  symptoms. Evaluated at [facility] for heart transplant but LVAD felt to be the best treatment for him at this time. POD33s/p HeartMate III LVAD. He was critically ill with multi-system organ failure but has continued to recover. Hemodynamics and VAD parameters very stable 2.H/OHypertension 3.H/OHyperlipidemia 4. PAF in sinuspreopon amiodarone and Eliquis.Now back in sinus off amiodarone.  5.H/OStage III CKD. Acute on chronic postop renal failureon intermittent HD. Making excellent urine output and Creat down further today.  Holding off on further HD for now to see how kidneys support him.  6. LBBB s/p CRT-D upgrade in 10/2016 without improvement in symptoms or EF. LV epicardial leads cut off at the time of surgery. 7. Prior smoking with mild obstruction and severe diffusion defect on PFT's. Continue IS. 8. Postop leukocytosis and fever. Now on Zyvox.  The only concerning source would be dialysis catheter infection. BC negative from 1/25. Afebrile and feels fine. WBC ct down to 16. 9. Marked rise in bilirubin and transaminases. Likely due to pump runand right sided congestion, hypotension.Resolved   10. INR therapeutic.  Coumadin per pharmacy.     11. Severe malnutrition with albumin 1.6-1.7. He is eating fairly well now.  Continue to encourage nutrition.     12. PT    13. Anemia: follow. He is on iron.           Plan/Discussion:    Overall he has been very stable. MAP in good range.  VAD flow has been great. Renal function continues to improve. I think HD catheter can be removed.  Continue Zyvox for leukocytosis of undetermined etiology.  Continue nutrition, ambulation, IS.  I reviewed the LVAD parameters from today, and compared the results to the patient's prior recorded data.  No programming changes were made.  The LVAD is functioning within specified parameters.   LVAD interrogation was negative for any significant power changes, alarms or PI events/speed drops.  LVAD equipment  check completed and is in good working order.  Back-up equipment present.   LVAD education done on emergency procedures and precautions and reviewed exit site care.    Fernande Boyden Johntae Broxterman 05/24/2017, 9:25 AM

## 2017-05-24 NOTE — Progress Notes (Signed)
Patient ID: David [name], male   DOB: [birth year], 52 y.o.   MRN: [id number]   Advanced Heart Failure VAD Team Note  Subjective:    David [name] is a 52 y.o. male with a PMH of ICM ('09 PCI to LAD, mRCA, '10 PCI to Lcx and '12 mRCA), HFrEF (EF ~20% for >5 years), HLD and HTN admitted 12/26 for VAD placement due to end-stage systolic HF.   - Underwent successful HM-3 LVAD placement 04/20/17. 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 CVVHD started 12/30 - Started on Meropenem 04/27/16 with + Achromobacter in tracheal aspirate. (Finished 05/07/17) - CVVHD stopped 1/10 - 1st iHD 1/12 - Tunneled HD cath placed 05/08/17 - Keflex for mild drainage from Driveline 08/02/79 switched augmentin. Completed augmentin 1/23 - Started linezolid 1/25 for ?cellulitis -> to 06/02/17  Yesterday torsemide stopped. Made 1.8 liters urine. Creatinine stable at 3.7. No uremia. Weight down. Still with some chest soreness but improving. Able to walk in halls. Denies SOB/orthopnea. No fevers. WBC falling   LVAD INTERROGATION:  HeartMate III LVAD:  Flow 5.1 liters/min, speed 5400, power 4.0, PI 3.1 Occasional PI events.   Objective:    MAP 80s   Vital Signs:   Temp:  [98 F (36.7 C)-98.2 F (36.8 C)] 98 F (36.7 C) (01/30 0756) Pulse Rate:  [82-90] 89 (01/30 0452) Resp:  [10-17] 10 (01/30 0452) BP: (86-98)/(64-87) 86/64 (01/30 0756) SpO2:  [93 %-99 %] 96 % (01/30 0452) Weight:  [141 lb 12.1 oz (64.3 kg)] 141 lb 12.1 oz (64.3 kg) (01/30 0452) Last BM Date: 05/22/17   Intake/Output:   Intake/Output Summary (Last 24 hours) at 05/24/2017 0837 Last data filed at 05/24/2017 0453 Gross per 24 hour  Intake 380 ml  Output 1800 ml  Net -1420 ml   Physical Exam  Physical Exam: GENERAL: Sitting on the side of the bed.  HEENT: normal edentulous NECK: Supple, JVP 5-6  .  2+ bilaterally, no bruits.  No lymphadenopathy or thyromegaly appreciated.   CARDIAC:  Mechanical heart sounds with LVAD hum present. R upper  chest HD catheter LUNGS:  Clear to auscultation bilaterally.  ABDOMEN:  Soft, round, nontender, positive bowel sounds x4.     LVAD exit site:  Dressing dry and intact.  No erythema or drainage.  Stabilization device present and accurately applied.  Driveline dressing is being changed daily per sterile technique. EXTREMITIES:  Warm and dry, no cyanosis, clubbing, rash or edema  NEUROLOGIC:  Alert and oriented x 4.  Gait steady.  No aphasia.  No dysarthria.  Affect pleasant.        Telemetry   NSR 80s, personally reviewed.   Labs   Basic Metabolic Panel: Recent Labs  Lab 05/20/17 0649  05/21/17 0458  05/22/17 0816 05/22/17 1527 05/23/17 0309 05/23/17 1611 05/24/17 0257  NA 132*   < > 132*   < > 136 133* 135 132* 133*  K 3.1*   < > 3.5   < > 4.1 4.0 4.0 3.7 3.6  CL 91*   < > 90*   < > 93* 91* 92* 89* 92*  CO2 28   < > 28   < > 32 30 32 29 29  GLUCOSE 81   < > 81   < > 73 104* 82 94 116*  BUN 16   < > 20   < > 25* 25* 26* 25* 26*  CREATININE 3.44*   < > 3.90*   < > 4.11* 3.90* 3.84*  3.79* 3.74*  CALCIUM 8.5*   < > 8.5*   < > 8.6* 8.3* 8.5* 8.2* 8.2*  MG 1.8  --  1.6*  --  1.7  --  1.8  --  2.1  PHOS 4.7*   < > 6.4*   < > 6.4* 5.8* 5.2* 5.5* 5.6*   < > = values in this interval not displayed.    Liver Function Tests: Recent Labs  Lab 05/22/17 0816 05/22/17 1527 05/23/17 0309 05/23/17 1611 05/24/17 0257  ALBUMIN 1.7* 1.7* 1.7* 1.7* 1.6*   No results for input(s): LIPASE, AMYLASE in the last 168 hours. No results for input(s): AMMONIA in the last 168 hours.  CBC: Recent Labs  Lab 05/20/17 0649 05/21/17 0458 05/22/17 0816 05/23/17 0309 05/24/17 0257  WBC 20.8* 20.6* 19.0* 18.3* 16.3*  NEUTROABS 14.4*  --   --   --   --   HGB 8.5* 8.5* 8.7* 8.2* 8.3*  HCT 27.8* 26.6* 28.9* 27.2* 26.6*  MCV 86.3 86.1 87.3 87.2 87.2  PLT 334 317 317 298 260    INR: Recent Labs  Lab 05/20/17 0649 05/21/17 0458 05/22/17 0816 05/23/17 0309 05/24/17 0257  INR 2.55 2.38 2.19  2.18 2.29    Other results:  Imaging   No results found.   Medications:     Scheduled Medications: . aspirin EC  81 mg Oral Daily  . atorvastatin  40 mg Oral q1800  . chlorhexidine  15 mL Mouth Rinse BID  . darbepoetin (ARANESP) injection - DIALYSIS  200 mcg Intravenous Q Thu-HD  . feeding supplement  1 Container Oral BID BM  . ferrous [id number] C-folic acid  1 capsule Oral BID BM  . mouth rinse  15 mL Mouth Rinse q12n4p  . pantoprazole  40 mg Oral Daily  . polyethylene glycol  17 g Oral Daily  . sevelamer carbonate  800 mg Oral TID WC  . Warfarin - Pharmacist Dosing Inpatient   Does not apply q1800    Infusions: . sodium chloride Stopped (04/30/17 0800)  . ferric gluconate (FERRLECIT/NULECIT) IV Stopped (05/18/17 1106)    PRN Medications: heparin, levalbuterol, ondansetron (ZOFRAN) IV, oxyCODONE, simethicone, sodium chloride flush, sodium chloride flush   Patient Profile   David [name] is a 52 y.o. male with a PMH of ICM ('09 PCI to LAD, mRCA, '10 PCI to Lcx and '12 mRCA), HFrEF (EF ~20% for >5 years) s/p HM-III VAD placement on 12/27 due to end-stage systolic HF.   Assessment/Plan:    1. Acute on chronic systolic HF: Echo 16/01/09 with LVEF 20-25%, Mild MR, Mild/Mod MR, Severe LAE, Mildly reduced RV, PA peak pressure 56 mm Hg.  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 Extubated, initially stopped epinephrine/norepinephrine but restarted with fall in UOP and MAP.  With progressive renal failure, pulmonary edema, and hypotension, he was re-intubated on 12/30.   CVVH was begun.  12/31 was difficult day with refractory shock (MAP in 60s).  Reviewed 12/31 echo which showed no significant pericardial effusion, adequate RV function, and IV septum near mid-line. Off all pressors. Stable. - has not had HD since 05/18/17  - Torsemide stopped earlier. Volume status stable.   - Continue aspirin 81 mg daily.  - Warfarin goal INR 2-2.5.   - INR stable 2.29. Discussed dosing with PharmD personally. - VAD interrogated personally. Parameters stable. 2. CAD s/p PCI to mid RCA and PLOM with DES x 2 and DES to mid LAD in 1/18.   -  No s/s ischemia. Continue ASA and statin. No change.  3. PAF:  - Remains in NSR.  4. AKI on CKD stage III: Suspect some degree of intrinsic renal disease prior to LVAD placement (baseline creatinine around 1.5) with probable ATN from intra-op/peri-op hypotension and development of vasodilatory/septic shock.  CVVHD stopped 05/04/2017. First iHD 05/06/17 without problems.Tunneled HD cath placed 05/08/17.  Vein mapping done plan for AV graft placement if no recovery.  Last HD on 1/24.   -Creatinine unchanged 3.7  - Discussed with Dr Florene Glen at the bedside. Possible removal of temporary HD catheter tomorrow.  - BMET daily 5. Anemia: s/p 1 u PRBCs 12/30, 04/25/17, 05/02/17.  - Hgb 8.3 stable.  - Continue aranesp.  - Want to limit transfusion with possibility of transplant down the road. Follow closely.  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 ID: Started on cefepime empirically with septic/vasodilatory shock, PCT was 31 04/25/17.  ABX broadened to Meropenem 04/27/16 with + Achromobacter on tracheal aspirate. Finished 05/07/17 (10 days).  WBCs rising more recently, started on linezolid 1/25 for ?cellulitis around driveline.   - WBCs 10.2-> 18.3 ->16.3 .  - Will complete linezolid 06/02/17.  - Hopefully can pull perm-cath soon. Possible tomorrow.  7. Elevated LFTs due to shock liver.   - LFTs improved but albumin low. Suspect compromised liver synthetic function. Encouraged protein intake. 8. Chest/pocket Pain:  - Continue gabapentin to 300 mg BID.  9. Protein-calorie malnutrition - Prealbumin trending up 12>14.4.   Length of Stay: [place], NP 05/24/2017, 8:37 AM  VAD Team --- VAD ISSUES ONLY--- Pager [phone] (7am - 7am) Advanced Heart Failure Team  Pager [phone] (M-F; 7a - 4p)  Please  contact [facility] for night-coverage after hours (4p -7a ) and weekends on [email or web address]  Patient seen and examined with Darrick Grinder, NP. We discussed all aspects of the encounter. I agree with the assessment and plan as stated above.   Starting to make real progress. Creatinine stable without HD for almost a week. Volume status looks good of diuretics. Renal has seen and recommended keeping perm cath in one more day and pull tomorrow. Leukocytosis improving with linezolid. Will continue. Chest pain improving. Wean narcotics as tolerated. Nutritional status also improving. Pre-albumin rising. Good po intake. INR 2.29. Discussed dosing with PharmD personally. VAD interrogated personally. Parameters stable.  Will continue to educate and ambulate. Would be nice to get him home by end of week.   Glori Bickers, MD  11:23 AM

## 2017-05-24 NOTE — Progress Notes (Signed)
[facility] for Coumadin Indication: LVAD  Allergies  Allergen Reactions  . Plavix [Clopidogrel Bisulfate] Hives   Patient Measurements: Height: 5\' 5"  (165.1 cm) Weight: 141 lb 12.1 oz (64.3 kg) IBW/kg (Calculated) : 61.5  Vital Signs: Temp: 98.2 F (36.8 C) (01/30 1215) Temp Source: Oral (01/30 1215) BP: 108/83 (01/30 1215) Pulse Rate: 89 (01/30 0452)  Labs: Recent Labs    05/22/17 0816  05/23/17 0309 05/23/17 1611 05/24/17 0257  HGB 8.7*  --  8.2*  --  8.3*  HCT 28.9*  --  27.2*  --  26.6*  PLT 317  --  298  --  260  LABPROT 24.2*  --  24.1*  --  25.0*  INR 2.19  --  2.18  --  2.29  CREATININE 4.11*   < > 3.84* 3.79* 3.74*   < > = values in this interval not displayed.   Estimated Creatinine Clearance: 19.9 mL/min (A) (by C-G formula based on SCr of 3.74 mg/dL (H)).  Assessment: 52 yo male s/p LVAD implantation 12/27. Warfarin on hold with plan for possible HD graft next week, will need heparin bridge once INR <2.0.  Okay with cardiology to resume warfarin - possibly remove perm HD cath tomorrow. INR today is 2.29, CBC/LDH stable. No signs/symptoms of bleeding.   Goal of Therapy:  INR 2-2.5 Monitor platelets by anticoagulation protocol: Yes   Plan: -Warfarin 1.5 mg tonight -Daily PT/INR  Doylene Canard, PharmD Clinical Pharmacist  Pager: [phone] Clinical Phone for 05/24/2017 until 3:30pm: [phone] If after 3:30pm, please call main pharmacy at [phone]  05/24/2017 2:22 PM

## 2017-05-24 NOTE — Progress Notes (Signed)
CARDIAC REHAB PHASE I   PRE:  Rate/Rhythm: 93 SR    BP: sitting 82 MAP    SaO2:   MODE:  Ambulation: 1275 ft   POST:  Rate/Rhythm: 103 ST    BP: sitting 84 MAP     SaO2:   Pt doing well. Able to connect himself to batteries without any cues. Walked long distance without rest. Returned to power cord appropriately after walk.  [place], ACSM 05/24/2017 3:06 PM

## 2017-05-24 NOTE — Progress Notes (Addendum)
Physical Therapy Treatment Patient Details Name: David [name] MRN: [id number] DOB: [birth year] Today's Date: 05/24/2017    History of Present Illness This 52 y.o. male admitted for LVAD. Pt extubated 12/28.   on 12/30, pt developed respiratory distress with pulmonary edema, and increased creatanine.  He was intubated 12/30 > 04/26/16, and CRRT initiated 12/30 and discontinued 1/10. Pt now on intermittent HD.   PMH includes:  Acute on chronic systolic HF, CAD, PAF, CKD stage III, LBBB, frequent PVCs, seizures, PNS, MI, ischemic cardiomyopathy, s/p AICD     PT Comments    Pt performed increased activity and progressed to stair training.  Pt performing power source transfer with minimal intermittent cueing.  Pt required cues for awareness of drive line and performing stair training in a safe manner.  Pt tolerate tx well.  Informed supervising PT of patient progress and will reduce frequency at this time to 1x per week.   Follow Up Recommendations  Supervision/Assistance - 24 hour;Home health PT     Equipment Recommendations  Other (comment)    Recommendations for Other Services       Precautions / Restrictions Precautions Precautions: Fall;Sternal Precaution Comments: remphasized the need to be aware of drive line with bed mobility Restrictions Weight Bearing Restrictions: No Other Position/Activity Restrictions: sternal precautions    Mobility  Bed Mobility Overal bed mobility: Needs Assistance Bed Mobility: Supine to Sit     Supine to sit: Supervision     General bed mobility comments: Pt required cues to not pull into sitting.    Transfers Overall transfer level: Needs assistance Equipment used: 4-wheeled walker(rollator) Transfers: Sit to/from Stand Sit to Stand: Supervision Stand pivot transfers: Supervision       General transfer comment: cues for use of breaks   Ambulation/Gait Ambulation/Gait assistance: Supervision Ambulation Distance (Feet): 1250  Feet Assistive device: 4-wheeled walker(with a seat ( rollator)) Gait Pattern/deviations: Step-through pattern Gait velocity: decr Gait velocity interpretation: Below normal speed for age/gender General Gait Details: Pt required cues for pacing, unable to obtain pulse ox reading, HR stable.    Stairs Stairs: Yes   Stair Management: No rails;Backwards;With walker Number of Stairs: 3 General stair comments: Pt performed stair negotiation backwards x3 with cues for safety and sequencing and min assist to lift Rollator to top of curb step after ascending.    Wheelchair Mobility    Modified Rankin (Stroke Patients Only)       Balance     Sitting balance-Leahy Scale: Good       Standing balance-Leahy Scale: Fair Standing balance comment: With intermittent UE support.                              Cognition Arousal/Alertness: Awake/alert Behavior During Therapy: WFL for tasks assessed/performed Overall Cognitive Status: Within Functional Limits for tasks assessed Area of Impairment: Safety/judgement                         Safety/Judgement: Decreased awareness of safety     General Comments: Pt sitting on drive line on arrival and attempted to pull into seated position reaching for PTA.        Exercises      General Comments        Pertinent Vitals/Pain Pain Assessment: (Continues to report pain is good but will not place a numerical value.  )    Home Living  Prior Function            PT Goals (current goals can now be found in the care plan section) Acute Rehab PT Goals Patient Stated Goal: To get stronger and go home  Potential to Achieve Goals: Good Progress towards PT goals: Progressing toward goals    Frequency    Min 1X/week(Per communication with supervising PT)      PT Plan Current plan remains appropriate    Co-evaluation              AM-PAC PT "6 Clicks" Daily Activity  Outcome  Measure  Difficulty turning over in bed (including adjusting bedclothes, sheets and blankets)?: None Difficulty moving from lying on back to sitting on the side of the bed? : None Difficulty sitting down on and standing up from a chair with arms (e.g., wheelchair, bedside commode, etc,.)?: A Little Help needed moving to and from a bed to chair (including a wheelchair)?: A Little Help needed walking in hospital room?: A Little Help needed climbing 3-5 steps with a railing? : A Little 6 Click Score: 20    End of Session Equipment Utilized During Treatment: Oxygen(lvad back up back.) Activity Tolerance: Patient tolerated treatment well Patient left: with call bell/phone within reach;in bed Nurse Communication: Mobility status(patient's awareness of drive line) PT Visit Diagnosis: Unsteadiness on feet (R26.81);Muscle weakness (generalized) (M62.81);Difficulty in walking, not elsewhere classified (R26.2)     Time: 9611-6435 PT Time Calculation (min) (ACUTE ONLY): 26 min  Charges:  $Gait Training: 8-22 mins $Therapeutic Activity: 8-22 mins                    G Codes:       [name], PTA pager [phone]    [name] 05/24/2017, 2:33 PM

## 2017-05-24 NOTE — Progress Notes (Signed)
60I with systolic CHF s/p LVAD 29/79 with AKI On CKD-.   1. Nonoliguric AKI on CKD III--likely due to ATN from shock s/p LVAD placement with underlying arterionephrosclerosis. Required CRRT initially - transitioned to IHD (1/11). S/p TDC with IR 05/08/17. Last HD Last HD 1/21. No HD needs today. Improving.  Diuretics on hold. Weight down and good UOP.  If renal fct stable or improved can remove dialysis cath in AM.  2. Acute on chronic systolic CHF: s/p Heartmate III 12/27.   3. Driveline drainage: On zyvox   4. Anemia- iron stores low, giving iron - on darbe   Subjective: Interval History: Ok  Objective: Vital signs in last 24 hours: Temp:  [98 F (36.7 C)-98.2 F (36.8 C)] 98 F (36.7 C) (01/30 0756) Pulse Rate:  [82-90] 89 (01/30 0452) Resp:  [10-17] 10 (01/30 0452) BP: (86-98)/(64-87) 86/64 (01/30 0756) SpO2:  [93 %-99 %] 96 % (01/30 0452) Weight:  [64.3 kg (141 lb 12.1 oz)] 64.3 kg (141 lb 12.1 oz) (01/30 0452) Weight change: -0.4 kg (-14.1 oz)  Intake/Output from previous day: 01/29 0701 - 01/30 0700 In: 380 [P.O.:280; IV Piggyback:100] Out: 1800 [Urine:1800] Intake/Output this shift: Total I/O In: -  Out: 300 [Urine:300]  General appearance: alert and cooperative Resp: clear to auscultation bilaterally and motor hum Chest wall: no tenderness Extremities: edema none  Lab Results: Recent Labs    05/23/17 0309 05/24/17 0257  WBC 18.3* 16.3*  HGB 8.2* 8.3*  HCT 27.2* 26.6*  PLT 298 260   BMET:  Recent Labs    05/23/17 1611 05/24/17 0257  NA 132* 133*  K 3.7 3.6  CL 89* 92*  CO2 29 29  GLUCOSE 94 116*  BUN 25* 26*  CREATININE 3.79* 3.74*  CALCIUM 8.2* 8.2*   No results for input(s): PTH in the last 72 hours. Iron Studies: No results for input(s): IRON, TIBC, TRANSFERRIN, FERRITIN in the last 72 hours. Studies/Results: No results found.  Scheduled: . aspirin EC  81 mg Oral Daily  . atorvastatin  40 mg Oral q1800  . chlorhexidine   15 mL Mouth Rinse BID  . darbepoetin (ARANESP) injection - DIALYSIS  200 mcg Intravenous Q Thu-HD  . feeding supplement  1 Container Oral BID BM  . ferrous [id number] C-folic acid  1 capsule Oral BID BM  . mouth rinse  15 mL Mouth Rinse q12n4p  . pantoprazole  40 mg Oral Daily  . polyethylene glycol  17 g Oral Daily  . sevelamer carbonate  800 mg Oral TID WC  . Warfarin - Pharmacist Dosing Inpatient   Does not apply q1800   Continuous: . sodium chloride Stopped (04/30/17 0800)  . ferric gluconate (FERRLECIT/NULECIT) IV Stopped (05/18/17 1106)      LOS: 35 days   Estanislado Emms 05/24/2017,10:47 AM

## 2017-05-24 NOTE — Progress Notes (Signed)
Exit Site Care:  VAD dressing removed and site care performed using sterile technique. Drive line exit site cleaned with Chlora prep applicators x 2, allowed to dry, and gauze dressing with aquacel silver strip re-applied. Exit site with partial tissue ingrowth, the velour is fully implanted at exit site. Large scab around drive line. Slight erythema around exit site, no tenderness, drainage, foul odor, or rash noted. Drive line anchor in place and accurately applied.      [name] RN, VAD Coordinator 24/7 pager [phone]

## 2017-05-24 NOTE — Progress Notes (Signed)
LVAD Coordinator Rounding Note:  Admitted12/26due to decompensated heart failure.  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  Pt states he feels well today and slept good last night.   Vital signs: Tmax: 98.0 HR:89 Doppler Pressure: 84 Automatic cuff: 88/64 (71) O2 Sat: 96% on2 L/[place] OI:757>972>820>601>561>537>943>276>147>092>957>473>403>709 lbs  LVAD interrogation reveals:  Speed:5400 Flow: 5.0 Power:4.0w PI:3.9 Alarms:none Events:16 PI events Hematocrit:27 Fixed speed:5400 Low speed limit:5100  Drive line site: dressing dry and intact; anchor intact and accurately applied. VAD Coordinator will change dressing today.  Labs:  LDH trend:376>355>315>265>263>281>293>224>231>259>242>240>209>207>204>184  INRtrend:1.49>1.63>1.99>1.97>2.52>3.72>4.60>4.76>3.81>3.33>2.99>2.55>2.38>2.19>2.18>2.29  WBC trend: 24.0>20.6>19.0>18.3>16.3  Prealbumin:  12.3>14.4  Anticoagulation Plan: -INR Goal:2-2.5 -ASA Dose: 81 mg   Blood Products: Intra op:2 units/FFP 04/20/17  Post UK:RCVK'F 1 unit 12/29 1 unit 12/30 1 unit 04/25/17 1 unit 05/02/17 1 unit 05/08/17  Device:Medtronic BiV -Therapies:on  Arrhythmia: PAF, but in sinus pre-op (on amiodarone and Eliquis for this) - Afib 12/29 - 04/28/17 - NSR 1/5 - present  Nitric Oxide: - stopped on 12/30 due to initiation of BiPap  Respiratory: - Extubated 12/28 - Reintubated 04/23/2017 - Extubated 04/26/2017  Renal:  04/23/17 - CRRT started for AKI - stopped 05/05/17 -Intermittent HD MWF; now as needed  Infection: - 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 2. VAD education to be completed at [facility] February 5th. 3. VAD coordinator changed dressing today.Will continue daily dressing changes. 4. Prealbumin still low; encourage protein intake.    Zada Girt RN, VAD Coordinator 24/7 pager [phone]

## 2017-05-25 ENCOUNTER — Inpatient Hospital Stay (HOSPITAL_COMMUNITY): Payer: Medicare HMO

## 2017-05-25 ENCOUNTER — Encounter (HOSPITAL_COMMUNITY): Payer: Self-pay | Admitting: Radiology

## 2017-05-25 HISTORY — PX: IR REMOVAL TUN CV CATH W/O FL: IMG2289

## 2017-05-25 LAB — CBC
HCT: 26.4 % — ABNORMAL LOW (ref 39.0–52.0)
Hemoglobin: 8 g/dL — ABNORMAL LOW (ref 13.0–17.0)
MCH: 26.7 pg (ref 26.0–34.0)
MCHC: 30.3 g/dL (ref 30.0–36.0)
MCV: 88 fL (ref 78.0–100.0)
PLATELETS: 243 10*3/uL (ref 150–400)
RBC: 3 MIL/uL — ABNORMAL LOW (ref 4.22–5.81)
RDW: 21.5 % — AB (ref 11.5–15.5)
WBC: 14.2 10*3/uL — ABNORMAL HIGH (ref 4.0–10.5)

## 2017-05-25 LAB — RENAL FUNCTION PANEL
ALBUMIN: 1.7 g/dL — AB (ref 3.5–5.0)
ALBUMIN: 1.7 g/dL — AB (ref 3.5–5.0)
Anion gap: 8 (ref 5–15)
Anion gap: 12 (ref 5–15)
BUN: 25 mg/dL — AB (ref 6–20)
BUN: 30 mg/dL — AB (ref 6–20)
CALCIUM: 8.6 mg/dL — AB (ref 8.9–10.3)
CALCIUM: 8.4 mg/dL — AB (ref 8.9–10.3)
CO2: 33 mmol/L — ABNORMAL HIGH (ref 22–32)
CO2: 29 mmol/L (ref 22–32)
CREATININE: 3.38 mg/dL — AB (ref 0.61–1.24)
Chloride: 94 mmol/L — ABNORMAL LOW (ref 101–111)
Chloride: 95 mmol/L — ABNORMAL LOW (ref 101–111)
Creatinine, Ser: 3.21 mg/dL — ABNORMAL HIGH (ref 0.61–1.24)
GFR calc Af Amer: 22 mL/min — ABNORMAL LOW (ref >60)
GFR calc Af Amer: 24 mL/min — ABNORMAL LOW (ref >60)
GFR calc non Af Amer: 19 mL/min — ABNORMAL LOW (ref >60)
GFR calc non Af Amer: 21 mL/min — ABNORMAL LOW (ref >60)
GLUCOSE: 89 mg/dL (ref 65–99)
Glucose, Bld: 86 mg/dL (ref 65–99)
PHOSPHORUS: 4.7 mg/dL — AB (ref 2.5–4.6)
POTASSIUM: 4.1 mmol/L (ref 3.5–5.1)
Phosphorus: 5.4 mg/dL — ABNORMAL HIGH (ref 2.5–4.6)
Potassium: 4.4 mmol/L (ref 3.5–5.1)
SODIUM: 136 mmol/L (ref 135–145)
Sodium: 135 mmol/L (ref 135–145)

## 2017-05-25 LAB — BRAIN NATRIURETIC PEPTIDE: B Natriuretic Peptide: 497.9 pg/mL — ABNORMAL HIGH (ref 0.0–100.0)

## 2017-05-25 LAB — LACTATE DEHYDROGENASE: LDH: 183 U/L (ref 98–192)

## 2017-05-25 LAB — PROTIME-INR
INR: 2.96
PROTHROMBIN TIME: 30.5 s — AB (ref 11.4–15.2)

## 2017-05-25 LAB — MAGNESIUM: MAGNESIUM: 2 mg/dL (ref 1.7–2.4)

## 2017-05-25 MED ORDER — CHLORHEXIDINE GLUCONATE 4 % EX LIQD
CUTANEOUS | Status: AC
  Filled 2017-05-25: qty 15

## 2017-05-25 MED ORDER — LIDOCAINE HCL 1 % IJ SOLN
INTRAMUSCULAR | Status: AC
  Filled 2017-05-25: qty 20

## 2017-05-25 NOTE — Progress Notes (Signed)
41U with systolic CHF s/p LVAD 38/45 with AKI On CKD-.   1. Nonoliguric AKI on CKD III--likely due to ATN from shock s/p LVAD placement with underlying arterionephrosclerosis. Required CRRT initially - transitioned to IHD (1/11). S/p TDC with IR 05/08/17. Last HD Last HD 1/21.   Plan: Remove TDC.  Diuretics per cardiology.  Renal will sign off.  He does not need renal follow up unless function does not get back to baseline.  Subjective: Interval History: OK  Objective: Vital signs in last 24 hours: Temp:  [97.5 F (36.4 C)-98.4 F (36.9 C)] 97.9 F (36.6 C) (01/31 0513) Pulse Rate:  [73-93] 93 (01/31 0513) Resp:  [14-18] 18 (01/31 0513) BP: (84-108)/(49-83) 88/73 (01/31 0513) SpO2:  [92 %-99 %] 92 % (01/31 0513) Weight:  [64.3 kg (141 lb 12.1 oz)] 64.3 kg (141 lb 12.1 oz) (01/31 0513) Weight change: 0 kg (0 lb)  Intake/Output from previous day: 01/30 0701 - 01/31 0700 In: 960 [P.O.:960] Out: 1550 [Urine:1550] Intake/Output this shift: No intake/output data recorded.  General appearance: alert and cooperative Resp: clear to auscultation bilaterally Extremities: extremities normal, atraumatic, no cyanosis or edema  Lab Results: Recent Labs    05/24/17 0257 05/25/17 0306  WBC 16.3* 14.2*  HGB 8.3* 8.0*  HCT 26.6* 26.4*  PLT 260 243   BMET:  Recent Labs    05/24/17 1604 05/25/17 0306  NA 134* 135  K 3.7 4.1  CL 92* 94*  CO2 30 33*  GLUCOSE 107* 86  BUN 27* 25*  CREATININE 3.70* 3.38*  CALCIUM 8.4* 8.6*   No results for input(s): PTH in the last 72 hours. Iron Studies: No results for input(s): IRON, TIBC, TRANSFERRIN, FERRITIN in the last 72 hours. Studies/Results: No results found.    LOS: 36 days   Estanislado Emms 05/25/2017,8:38 AM

## 2017-05-25 NOTE — Progress Notes (Signed)
CSW met with patient at bedside this morning. Patient was in good spirits and hopeful to go home possibly tomorrow. Patient's wife admitted on 3W and shared possible discharge pending echo results. Wife and patient both ready for discharge to begin their new life at home with living with an LVAD. Patient shared "hopeful he will be independent". CSW provided encouragement and assured wife that patient is ready and able to care for self at home. CSW will follow for supportive needs and follow up in outpatient VAD clinic.

## 2017-05-25 NOTE — Progress Notes (Signed)
CSW received call from wife stating she was in the [facility] ED with stroke like symptoms. CSW visited wife in ED as she was anxious about informing patient that she was now to be admitted to the hospital. Wife was concerned about patients reaction and asked CSW to assist with informing him and to be at bedside when he learns of the hospitalization. CSW met with patient at bedside and patient was concerned but mentioned he was "glad she is getting it looked into". Patient states he will call her later to follow up. CSW will continue follow for supportive needs. Raquel Sarna, [place], [place]

## 2017-05-25 NOTE — Plan of Care (Signed)
Continue current care plan 

## 2017-05-25 NOTE — Progress Notes (Signed)
[place] for Coumadin Indication: LVAD  Allergies  Allergen Reactions  . Plavix [Clopidogrel Bisulfate] Hives   Patient Measurements: Height: 5\' 5"  (165.1 cm) Weight: 141 lb 12.1 oz (64.3 kg) IBW/kg (Calculated) : 61.5  Vital Signs: Temp: 97.9 F (36.6 C) (01/31 0513) Temp Source: Oral (01/31 0513) BP: 106/94 (01/31 0808) Pulse Rate: 93 (01/31 0513)  Labs: Recent Labs    05/23/17 0309  05/24/17 0257 05/24/17 1604 05/25/17 0306  HGB 8.2*  --  8.3*  --  8.0*  HCT 27.2*  --  26.6*  --  26.4*  PLT 298  --  260  --  243  LABPROT 24.1*  --  25.0*  --  30.5*  INR 2.18  --  2.29  --  2.96  CREATININE 3.84*   < > 3.74* 3.70* 3.38*   < > = values in this interval not displayed.   Estimated Creatinine Clearance: 22 mL/min (A) (by C-G formula based on SCr of 3.38 mg/dL (H)).  Assessment: 52 yo male s/p LVAD implantation 12/27. Warfarin on hold with plan for possible HD graft next week, will need heparin bridge once INR <2.0.  Okay with cardiology to resume warfarin - plan to remove perm HD cath today. INR today is 2.96 from 2.29, CBC/LDH stable. Has received 3 doses since warfarin resumed on 1/28.  No signs/symptoms of bleeding.   Goal of Therapy:  INR 2-2.5 Monitor platelets by anticoagulation protocol: Yes   Plan: -Holding warfarin tonight -Daily PT/INR -Monitor signs/symptoms of bleeding  Doylene Canard, PharmD Clinical Pharmacist  Pager: [phone] Clinical Phone for 05/25/2017 until 3:30pm: [phone] If after 3:30pm, please call main pharmacy at [phone]  05/25/2017 9:52 AM

## 2017-05-25 NOTE — Progress Notes (Signed)
Exit Site Care:  VAD dressing removed and site care performed using sterile technique. Drive line exit site cleaned with Chlora prep applicators x 2, allowed to dry, and gauze dressing with aquacel silver strip re-applied. Exit site with partial tissue ingrowth, the velour is fully implanted at exit site. Large scab around drive line. Slight erythema around exit site, no tenderness, drainage, foul odor, or rash noted. Drive line anchor in place and accurately applied.    Zada Girt RN, VAD Coordinator 24/7 pager [phone]

## 2017-05-25 NOTE — Procedures (Signed)
Successful removal of tunneled (R)IJ HD cath No complications.  Ascencion Dike PA-C Interventional Radiology 05/25/2017 9:53 AM

## 2017-05-25 NOTE — Progress Notes (Signed)
Patient ID: David [name], male   DOB: 10/22/1965, 52 y.o.   MRN: [id number]   Advanced Heart Failure VAD Team Note  Subjective:    David [name] is a 52 y.o. male with a PMH of ICM ('09 PCI to LAD, mRCA, '10 PCI to Lcx and '12 mRCA), HFrEF (EF ~20% for >5 years), HLD and HTN admitted 12/26 for VAD placement due to end-stage systolic HF.   - Underwent successful HM-3 LVAD placement 04/20/17. 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 CVVHD started 12/30 - Started on Meropenem 04/27/16 with + Achromobacter in tracheal aspirate. (Finished 05/07/17) - CVVHD stopped 1/10 - 1st iHD 1/12 - Tunneled HD cath placed 05/08/17 - Keflex for mild drainage from Driveline 06/24/58 switched augmentin. Completed augmentin 1/23 - Started linezolid 1/25 for ?cellulitis -> to 06/02/17  Feeling OK today. Anxious about going home. States his wife is in the hospital now for "her heart". Denies SOB. Making good UOP. Pain well controlled.   Creatinine 3.7 -> 3.3. Weight stable. Torsemide held as of 05/23/17. 1.5 L of urine.   LVAD INTERROGATION:  HeartMate III LVAD:  Flow 5.2 liters/min, speed 5400, power 4.0, PI 3.1. Occasional PI events.   Objective:    MAP 80s  Vital Signs:   Temp:  [97.5 F (36.4 C)-98.4 F (36.9 C)] 97.9 F (36.6 C) (01/31 0513) Pulse Rate:  [73-93] 93 (01/31 0513) Resp:  [14-18] 18 (01/31 0513) BP: (84-108)/(49-83) 88/73 (01/31 0513) SpO2:  [92 %-99 %] 92 % (01/31 0513) Weight:  [141 lb 12.1 oz (64.3 kg)] 141 lb 12.1 oz (64.3 kg) (01/31 0513) Last BM Date: 05/22/17   Intake/Output:   Intake/Output Summary (Last 24 hours) at 05/25/2017 0906 Last data filed at 05/25/2017 0518 Gross per 24 hour  Intake 840 ml  Output 1550 ml  Net -710 ml   Physical Exam   GENERAL: Well appearing this am. NAD.  HEENT: Normal. NECK: Supple, JVP 6-7 cm. Carotids OK.  CARDIAC:  Mechanical heart sounds with LVAD hum present. R upper chest HD catheter LUNGS:  CTAB, normal effort.  ABDOMEN:  NT, ND,  no HSM. No bruits or masses. +BS  LVAD exit site: Dressing dry and intact. No erythema or drainage. Stabilization device present and accurately applied. Driveline dressing changed daily per sterile technique. EXTREMITIES:  Warm and dry. No cyanosis, clubbing, rash, or edema.  NEUROLOGIC:  Alert & oriented x 3. Cranial nerves grossly intact. Moves all 4 extremities w/o difficulty. Affect pleasant      Telemetry   NSR 80s, personally reviewed.   Labs   Basic Metabolic Panel: Recent Labs  Lab 05/21/17 0458  05/22/17 0816  05/23/17 0309 05/23/17 1611 05/24/17 0257 05/24/17 1604 05/25/17 0306  NA 132*   < > 136   < > 135 132* 133* 134* 135  K 3.5   < > 4.1   < > 4.0 3.7 3.6 3.7 4.1  CL 90*   < > 93*   < > 92* 89* 92* 92* 94*  CO2 28   < > 32   < > 32 29 29 30  33*  GLUCOSE 81   < > 73   < > 82 94 116* 107* 86  BUN 20   < > 25*   < > 26* 25* 26* 27* 25*  CREATININE 3.90*   < > 4.11*   < > 3.84* 3.79* 3.74* 3.70* 3.38*  CALCIUM 8.5*   < > 8.6*   < > 8.5* 8.2*  8.2* 8.4* 8.6*  MG 1.6*  --  1.7  --  1.8  --  2.1  --  2.0  PHOS 6.4*   < > 6.4*   < > 5.2* 5.5* 5.6* 5.1* 5.4*   < > = values in this interval not displayed.    Liver Function Tests: Recent Labs  Lab 05/23/17 0309 05/23/17 1611 05/24/17 0257 05/24/17 1604 05/25/17 0306  ALBUMIN 1.7* 1.7* 1.6* 1.7* 1.7*   No results for input(s): LIPASE, AMYLASE in the last 168 hours. No results for input(s): AMMONIA in the last 168 hours.  CBC: Recent Labs  Lab 05/20/17 0649 05/21/17 0458 05/22/17 0816 05/23/17 0309 05/24/17 0257 05/25/17 0306  WBC 20.8* 20.6* 19.0* 18.3* 16.3* 14.2*  NEUTROABS 14.4*  --   --   --   --   --   HGB 8.5* 8.5* 8.7* 8.2* 8.3* 8.0*  HCT 27.8* 26.6* 28.9* 27.2* 26.6* 26.4*  MCV 86.3 86.1 87.3 87.2 87.2 88.0  PLT 334 317 317 298 260 243    INR: Recent Labs  Lab 05/21/17 0458 05/22/17 0816 05/23/17 0309 05/24/17 0257 05/25/17 0306  INR 2.38 2.19 2.18 2.29 2.96    Other  results:  Imaging   No results found.   Medications:     Scheduled Medications: . chlorhexidine      . lidocaine      . aspirin EC  81 mg Oral Daily  . atorvastatin  40 mg Oral q1800  . chlorhexidine  15 mL Mouth Rinse BID  . darbepoetin (ARANESP) injection - DIALYSIS  200 mcg Intravenous Q Thu-HD  . feeding supplement  1 Container Oral BID BM  . ferrous [id number] C-folic acid  1 capsule Oral BID BM  . mouth rinse  15 mL Mouth Rinse q12n4p  . pantoprazole  40 mg Oral Daily  . polyethylene glycol  17 g Oral Daily  . sevelamer carbonate  800 mg Oral TID WC  . Warfarin - Pharmacist Dosing Inpatient   Does not apply q1800    Infusions: . sodium chloride Stopped (04/30/17 0800)  . ferric gluconate (FERRLECIT/NULECIT) IV Stopped (05/18/17 1106)    PRN Medications: heparin, levalbuterol, ondansetron (ZOFRAN) IV, oxyCODONE, simethicone, sodium chloride flush, sodium chloride flush   Patient Profile   David [name] is a 52 y.o. male with a PMH of ICM ('09 PCI to LAD, mRCA, '10 PCI to Lcx and '12 mRCA), HFrEF (EF ~20% for >5 years) s/p HM-III VAD placement on 12/27 due to end-stage systolic HF.   Assessment/Plan:    1. Acute on chronic systolic HF: Echo 45/85/92 with LVEF 20-25%, Mild MR, Mild/Mod MR, Severe LAE, Mildly reduced RV, PA peak pressure 56 mm Hg.  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 Extubated, initially stopped epinephrine/norepinephrine but restarted with fall in UOP and MAP.  With progressive renal failure, pulmonary edema, and hypotension, he was re-intubated on 12/30.   CVVH was begun.  12/31 was difficult day with refractory shock (MAP in 60s).  Reviewed 12/31 echo which showed no significant pericardial effusion, adequate RV function, and IV septum near mid-line. Off all pressors. Stable. - has not had HD since 05/18/17  - Torsemide stopped earlier. Volume status stable.  - Will consider Torsemide 20 mg M/W/F for  home. None today.   - Continue aspirin 81 mg daily.  - Warfarin goal INR 2-2.5.  - INR 2.29 -> 2.9. Discussed dosing with PharmD personally.  - VAD interrogated personally. Parameters stable.  2. CAD s/p PCI to mid RCA and PLOM with DES x 2 and DES to mid LAD in 1/18.   - No s/s of ischemia. Continue ASA and statin. No change.  3. PAF:  - Remains in NSR.  4. AKI on CKD stage III: Suspect some degree of intrinsic renal disease prior to LVAD placement (baseline creatinine around 1.5) with probable ATN from intra-op/peri-op hypotension and development of vasodilatory/septic shock.  CVVHD stopped 05/04/2017. First iHD 05/06/17 without problems. Tunneled HD cath placed 05/08/17.  Vein mapping done plan for AV graft placement if no recovery.  Last HD on 1/24.   -Creatinine improved to 3.38.   - Discussed with Dr Florene Glen at the bedside. Possible removal of temporary HD catheter tomorrow.  - Continue to follow daily.  5. Anemia: s/p 1 u PRBCs 12/30, 04/25/17, 05/02/17.  - Hgb 8.0 stable. Follow in am.  - Continue aranesp.  - Want to limit transfusion with possibility of transplant down the road. Follow closely.  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 ID: Started on cefepime empirically with septic/vasodilatory shock, PCT was 31 04/25/17.  ABX broadened to Meropenem 04/27/16 with + Achromobacter on tracheal aspirate. Finished 05/07/17 (10 days).  WBCs rising more recently, started on linezolid 1/25 for ?cellulitis around driveline.   - WBCs 45.6-> 18.3 ->16.3 -> 14.2 - Will complete linezolid 06/02/17.  - Pulling perm-cath soon today.  7. Elevated LFTs due to shock liver.   - LFTs improved but albumin low. Suspect compromised liver synthetic function. Encouraged protein intake. 8. Chest/pocket Pain:  - Continue gabapentin 300 mg BID. Improved. 9. Protein-calorie malnutrition - Pre-albumin trending up 12>14.4. No change.   Length of Stay: [address] [place] 05/25/2017, 9:06  AM  VAD Team --- VAD ISSUES ONLY--- Pager [phone] (7am - 7am) Advanced Heart Failure Team  Pager [phone] (M-F; 7a - 4p)  Please contact [facility] for night-coverage after hours (4p -7a ) and weekends on [email or web address]  Patient seen with PA, agree with the above note.  Creatinine is down today, good UOP.  HD catheter removed.  LVAD parameters stable.  On exam, no JVD, normal LVAD sounds, no edema.   Doing well, nearing discharge.  Will probably start torsemide 20 mg MWF tomorrow.  So far, looks like he will be able to avoid HD.   Wife is in hospital, she is his primary caregiver.  This may complicate his discharge.   Loralie Champagne 05/25/2017 11:42 AM

## 2017-05-25 NOTE — Progress Notes (Signed)
CARDIAC REHAB PHASE I   PRE:  Rate/Rhythm: 93 SR  BP:  Supine:   Sitting: 90 mAP  Standing:    SaO2: 86% RA  97% 2L   MODE:  Ambulation: 880 ft   POST:  Rate/Rhythm: 93 SR  BP:  Supine:   Sitting: 100 MAP  Standing:    SaO2: 98% 2L 1320-1410 Put pt on RA and sats dropped to 86%. Put on 2L and sats to 97% after a couple of minutes. Pt able to connect to batteries without cues. He walked 880 ft with rollator on 2L and tolerated well. Cut walk shorter today because he wants RN to take to see his wife. Told RN I would leave pt on batteries and oxygen so she can take pt to see her. Discussed CRP 2. Pt would like to consider referral to [facility].   Graylon Good, RN BSN  05/25/2017 2:05 PM

## 2017-05-25 NOTE — Progress Notes (Signed)
LVAD Coordinator Rounding Note:  Admitted12/26due to decompensated heart failure.  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  Pt concerned about wife this am; reports she was admitted to [facility] yesterday with "heart problems". Asked pt if he feels he can handle VAD equipment without his wife helping and he reports that he can.  Dialysis catheter dc'd today with creat continuing to trend down.   Vital signs: Tmax: 97.9 HR:93 Doppler Pressure: 98 Automatic cuff: 106/94 (100) O2 Sat: 95% on2 L/[place] Wt:167>168>167>158>157>154>151>152>150>146>148>145>142>141>141 lbs  LVAD interrogation reveals:  Speed:5400 Flow: 4.9 Power:4.1w PI:3.6 Alarms:none Events:8 PI events Hematocrit:26 Fixed speed:5400 Low speed limit:5100  Drive line site: dressing dry and intact; anchor intact and accurately applied. VAD Coordinator will change dressing today.  Labs:  LDH trend:263>281>293>224>231>259>242>240>209>207>204>184>183  INRtrend:2.52>3.72>4.60>4.76>3.81>3.33>2.99>2.55>2.38>2.19>2.18>2.29>2.96  WBC trend: 24.0>20.6>19.0>18.3>16.3>14.2  Prealbumin:  12.3>14.4  Anticoagulation Plan: -INR Goal:2-2.5 -ASA Dose: 81 mg   Blood Products: Intra op:2 units/FFP 04/20/17  Post VB:TYOM'A 1 unit 12/29 1 unit 12/30 1 unit 04/25/17 1 unit 05/02/17 1 unit 05/08/17  Device:Medtronic BiV -Therapies:on  Arrhythmia: PAF, but in sinus pre-op (on amiodarone and Eliquis for this) - Afib 12/29 - 04/28/17 - NSR 1/5 - present  Nitric Oxide: - stopped on 12/30 due to initiation of BiPap  Respiratory: - Extubated 12/28 - Reintubated 04/23/2017 - Extubated 04/26/2017  Renal:  04/23/17 - CRRT started for AKI - stopped 05/05/17 -Intermittent HD MWF; now as needed  Infection: - 04/27/17 + 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 Two system controllers 2. One Mobile Power Unit with 21' patient cable 3. One universal Charity fundraiser (UBC) 4. Eight fully charged batteries  5. Four battery clips 6. One travel case 7. One holster vest 8. Wearable accessory package 9. 30 dressings and 10 anchors   Plan/Recommendations: 1.Please call VAD Pager for any equipment concerns or drive line dressing questions/concerns. 2. Cancelled VAD education at [facility] that was scheduled February 5th, 2019. 3. VAD coordinator changed dressing today.Will continue daily dressing changes. 4. Possible discharge home tomorrow. 5. Page VAD coordinator if any VAD equipment or drive line/dressing issues or questions.    Zada Girt RN, VAD Coordinator 24/7 pager [phone]

## 2017-05-26 LAB — CBC
HCT: 23.3 % — ABNORMAL LOW (ref 39.0–52.0)
Hemoglobin: 7.1 g/dL — ABNORMAL LOW (ref 13.0–17.0)
MCH: 26.6 pg (ref 26.0–34.0)
MCHC: 30.5 g/dL (ref 30.0–36.0)
MCV: 87.3 fL (ref 78.0–100.0)
PLATELETS: 227 10*3/uL (ref 150–400)
RBC: 2.67 MIL/uL — AB (ref 4.22–5.81)
RDW: 21.6 % — ABNORMAL HIGH (ref 11.5–15.5)
WBC: 14.4 10*3/uL — AB (ref 4.0–10.5)

## 2017-05-26 LAB — PROTIME-INR
INR: 3.42
PROTHROMBIN TIME: 34.2 s — AB (ref 11.4–15.2)

## 2017-05-26 LAB — RENAL FUNCTION PANEL
Albumin: 1.7 g/dL — ABNORMAL LOW (ref 3.5–5.0)
Anion gap: 11 (ref 5–15)
BUN: 29 mg/dL — ABNORMAL HIGH (ref 6–20)
CALCIUM: 8.5 mg/dL — AB (ref 8.9–10.3)
CHLORIDE: 97 mmol/L — AB (ref 101–111)
CO2: 29 mmol/L (ref 22–32)
CREATININE: 2.91 mg/dL — AB (ref 0.61–1.24)
GFR, EST AFRICAN AMERICAN: 27 mL/min — AB (ref >60)
GFR, EST NON AFRICAN AMERICAN: 23 mL/min — AB (ref >60)
Glucose, Bld: 93 mg/dL (ref 65–99)
Phosphorus: 4.1 mg/dL (ref 2.5–4.6)
Potassium: 3.7 mmol/L (ref 3.5–5.1)
Sodium: 137 mmol/L (ref 135–145)

## 2017-05-26 LAB — PREPARE RBC (CROSSMATCH)

## 2017-05-26 LAB — MAGNESIUM: Magnesium: 1.7 mg/dL (ref 1.7–2.4)

## 2017-05-26 LAB — LACTATE DEHYDROGENASE: LDH: 178 U/L (ref 98–192)

## 2017-05-26 MED ORDER — OXYCODONE HCL 10 MG PO TABS: 10.0000 mg | ORAL_TABLET | Freq: Four times a day (QID) | ORAL | 0 refills | Status: DC | PRN

## 2017-05-26 MED ORDER — WARFARIN SODIUM 2 MG PO TABS: ORAL_TABLET | ORAL | 3 refills | Status: DC

## 2017-05-26 MED ORDER — SODIUM CHLORIDE 0.9 % IV SOLN
Freq: Once | INTRAVENOUS | Status: AC
  Administered 2017-05-26: 16:00:00 via INTRAVENOUS

## 2017-05-26 MED ORDER — FE FUMARATE-B12-VIT C-FA-IFC PO CAPS: 1.0000 | ORAL_CAPSULE | Freq: Two times a day (BID) | ORAL | 3 refills | Status: DC

## 2017-05-26 MED ORDER — PANTOPRAZOLE SODIUM 40 MG PO TBEC: 40.0000 mg | DELAYED_RELEASE_TABLET | Freq: Every day | ORAL | 6 refills | Status: DC

## 2017-05-26 NOTE — Care Management Note (Signed)
Case Management Note   Patient Details  Name: David [name] MRN: [id number] Date of Birth: [birth year]  Subjective/Objective:  S/p LVAD implant 12/27, conts on dobutamine, co - x 81%, sob this am, urine output is less than 100cc in 24 hrs, CRRT was stopped on 1/10, transitioning to IHD, to place picc. He was already active with [facility] , will resume for PT and OT , but will need to see if still needs rollator at dc.     1/14 [name] RN, BSN- tunneled cathter placed today, bleeding around VAD drive site, hepain on hold, he had his first dialysis on 1/12,will need IHD, has minimal urine output, conts on dobutamine.  Remains hopeful to regain renal function but looking slimmer per MD notes.  1/16 [name] RN, BSN - POD 21 LVAD implant, conts with IHD, dobutamie off, hgb 7.8, conts on heaprin , lasix iv x 1 .  Discussed in LOS.  Patient is active with [facility] , he would like to stay with them when he goes home with [facility] for PT/OT, will need resume orders at that time.                     Action/Plan: PTA Pt lived at home with spouse- was active with [facility] for home Dobutamine-HH services- PCP- Gilford Rile-  CM will follow post VAD implant for transition needs- anticipate return home with wife.      Expected Discharge Date:  05/27/17               Expected Discharge Plan:  [place]  In-House Referral:     Discharge planning Services  CM Consult  Post Acute Care Choice:  Home Health, Resumption of Svcs/PTA Provider Choice offered to:  Patient  DME Arranged:    DME Agency:     HH Arranged:  RN, PT, OT [place] Agency:  [place]  Status of Service:  In process, will continue to follow  If discussed at Long Length of Stay Meetings, dates discussed:    Additional Comments: 05/26/2017  Pt is no longer requiring HD.  Pt will discharge home today.  Wife is at the home and will be with pt 24 hours a day.  [facility] made aware pt is discharging today  05/23/17 Pt  now has some possible renal recovery - Clipping process on hold.    05/18/17 Vein mapping done plan for AV graft placement if no recovery. Clipping process initated  05/17/17 CM ordered rollator from [facility].  [facility] aware that orders are in for [facility].   Pt is off all drips Maryclare Labrador, RN 05/26/2017, 4:33 PM

## 2017-05-26 NOTE — Plan of Care (Signed)
Continue current care plan 

## 2017-05-26 NOTE — Care Management Important Message (Signed)
Important Message  Patient Details  Name: David Cruzan MRN: [id number] Date of Birth: 29-Jan-1966   Medicare Important Message Given:  Yes    Carles Collet, RN 05/26/2017, 1:43 PM

## 2017-05-26 NOTE — Progress Notes (Signed)
ANTICOAGULATION CONSULT NOTE - Follow Up Consult  Pharmacy Consult for Coumadin Indication: LVAD  Allergies  Allergen Reactions  . Plavix [Clopidogrel Bisulfate] Hives   Patient Measurements: Height: 5\' 5"  (165.1 cm) Weight: 140 lb 4.8 oz (63.6 kg) IBW/kg (Calculated) : 61.5  Vital Signs: Temp: 98.7 F (37.1 C) (02/01 1119) Temp Source: Oral (02/01 1119) BP: 95/76 (02/01 1219) Pulse Rate: 98 (02/01 1219)  Labs: Recent Labs    05/24/17 0257  05/25/17 0306 05/25/17 1526 05/26/17 0229  HGB 8.3*  --  8.0*  --  7.1*  HCT 26.6*  --  26.4*  --  23.3*  PLT 260  --  243  --  227  LABPROT 25.0*  --  30.5*  --  34.2*  INR 2.29  --  2.96  --  3.42  CREATININE 3.74*   < > 3.38* 3.21* 2.91*   < > = values in this interval not displayed.   Estimated Creatinine Clearance: 25.5 mL/min (A) (by C-G formula based on SCr of 2.91 mg/dL (H)).  Assessment: 52 yo male s/p LVAD implantation 12/27 now resumed on coumadin. INR trended up to 2.96 yesterday and dose held. INR continues to rise to 3.42 today. Hgb down to 7.1 - to receive PRBCs.  Goal of Therapy:  INR 2-2.5 Monitor platelets by anticoagulation protocol: Yes   Plan: 1) Hold coumadin tonight 2) Daily INR  Nena Jordan, PharmD, BCPS  05/26/2017 1:58 PM

## 2017-05-26 NOTE — Progress Notes (Signed)
Patient ID: David [name], male   DOB: 31-Dec-1965, 52 y.o.   MRN: [id number]   Advanced Heart Failure VAD Team Note  Subjective:    David [name] is a 52 y.o. male with a PMH of ICM ('09 PCI to LAD, mRCA, '10 PCI to Lcx and '12 mRCA), HFrEF (EF ~20% for >5 years), HLD and HTN admitted 12/26 for VAD placement due to end-stage systolic HF.   - Underwent successful HM-3 LVAD placement 04/20/17. 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 CVVHD started 12/30 - Started on Meropenem 04/27/16 with + Achromobacter in tracheal aspirate. (Finished 05/07/17) - CVVHD stopped 1/10 - 1st iHD 1/12 - Tunneled HD cath placed 05/08/17 - Keflex for mild drainage from Driveline 2/84/13 switched augmentin. Completed augmentin 1/23 - Started linezolid 1/25 for ?cellulitis -> 05/24/17. - Tunneled HD cath pulled 05/25/17.  Feeling OK this am. Wants to go home today. Denies SOB. Pain well controlled. Continues to make UOP. Denies lightheadedness or dizziness. Hgb down to 7.1. Denies overt bleeding. Had large BM yesterday but normal colored with no notable blood or melena per RN.   Creatinine 3.7 -> 3.3 -> 2.9. Last dose of torsemide 05/23/17. Made 700 cc of UOP yesterday.   LVAD INTERROGATION:  HeartMate III LVAD:  Flow 4.8 liters/min, speed 5400, power 4.0, PI 3.3. Occasional PI events.    Objective:    MAP 80s  Vital Signs:   Temp:  [97.8 F (36.6 C)-98.8 F (37.1 C)] 98.7 F (37.1 C) (02/01 1119) Pulse Rate:  [58-101] 98 (02/01 1219) Resp:  [9-20] 9 (02/01 1219) BP: (68-104)/(43-86) 95/76 (02/01 1219) SpO2:  [93 %-100 %] 98 % (02/01 1219) Weight:  [140 lb 4.8 oz (63.6 kg)] 140 lb 4.8 oz (63.6 kg) (02/01 0430) Last BM Date: 05/25/17   Intake/Output:   Intake/Output Summary (Last 24 hours) at 05/26/2017 1355 Last data filed at 05/26/2017 1300 Gross per 24 hour  Intake 700 ml  Output 1000 ml  Net -300 ml   Physical Exam   GENERAL: Well appearing this am. NAD.  HEENT: Normal. NECK: Supple, JVP 7-8  cm. Carotids OK.  CARDIAC:  Mechanical heart sounds with LVAD hum present.  LUNGS:  CTAB, normal effort.  ABDOMEN:  NT, ND, no HSM. No bruits or masses. +BS  LVAD exit site: Well-healed and incorporated. Dressing dry and intact. No erythema or drainage. Stabilization device present and accurately applied. Driveline dressing changed daily per sterile technique. EXTREMITIES:  Warm and dry. No cyanosis, clubbing, rash, or edema.  NEUROLOGIC:  Alert & oriented x 3. Cranial nerves grossly intact. Moves all 4 extremities w/o difficulty. Affect pleasant     Telemetry   NSR 80s, personally reviewed.   Labs   Basic Metabolic Panel: Recent Labs  Lab 05/22/17 0816  05/23/17 0309  05/24/17 0257 05/24/17 1604 05/25/17 0306 05/25/17 1526 05/26/17 0229  NA 136   < > 135   < > 133* 134* 135 136 137  K 4.1   < > 4.0   < > 3.6 3.7 4.1 4.4 3.7  CL 93*   < > 92*   < > 92* 92* 94* 95* 97*  CO2 32   < > 32   < > 29 30 33* 29 29  GLUCOSE 73   < > 82   < > 116* 107* 86 89 93  BUN 25*   < > 26*   < > 26* 27* 25* 30* 29*  CREATININE 4.11*   < >  3.84*   < > 3.74* 3.70* 3.38* 3.21* 2.91*  CALCIUM 8.6*   < > 8.5*   < > 8.2* 8.4* 8.6* 8.4* 8.5*  MG 1.7  --  1.8  --  2.1  --  2.0  --  1.7  PHOS 6.4*   < > 5.2*   < > 5.6* 5.1* 5.4* 4.7* 4.1   < > = values in this interval not displayed.   Liver Function Tests: Recent Labs  Lab 05/24/17 0257 05/24/17 1604 05/25/17 0306 05/25/17 1526 05/26/17 0229  ALBUMIN 1.6* 1.7* 1.7* 1.7* 1.7*   No results for input(s): LIPASE, AMYLASE in the last 168 hours. No results for input(s): AMMONIA in the last 168 hours.  CBC: Recent Labs  Lab 05/20/17 0649  05/22/17 0816 05/23/17 0309 05/24/17 0257 05/25/17 0306 05/26/17 0229  WBC 20.8*   < > 19.0* 18.3* 16.3* 14.2* 14.4*  NEUTROABS 14.4*  --   --   --   --   --   --   HGB 8.5*   < > 8.7* 8.2* 8.3* 8.0* 7.1*  HCT 27.8*   < > 28.9* 27.2* 26.6* 26.4* 23.3*  MCV 86.3   < > 87.3 87.2 87.2 88.0 87.3  PLT 334   <  > 317 298 260 243 227   < > = values in this interval not displayed.    INR: Recent Labs  Lab 05/22/17 0816 05/23/17 0309 05/24/17 0257 05/25/17 0306 05/26/17 0229  INR 2.19 2.18 2.29 2.96 3.42    Other results:  Imaging   Ir Removal Tun Cv Cath W/o Fl  Result Date: 05/25/2017 INDICATION: Acute on chronic renal failure, improved. Request removal of tunneled hemodialysis catheter placed on 05/08/2017. EXAM: REMOVAL OF TUNNELED RIGHT IJ HEMODIALYSIS CATHETER MEDICATIONS: None COMPLICATIONS: None immediate. PROCEDURE: Informed written consent was obtained from the patient following an explanation of the procedure, risks, benefits and alternatives to treatment. A time out was performed prior to the initiation of the procedure. Maximal barrier sterile technique was utilized including caps, mask, sterile gowns, sterile gloves, large sterile drape, hand hygiene, and chlorhexidine. Utilizing a combination of blunt dissection and gentle traction, the cuff of the catheter was exposed and catheter was removed intact. Hemostasis was obtained with manual compression. A dressing was placed. The patient tolerated the procedure well without immediate post procedural complication. IMPRESSION: Successful removal of tunneled right IJ hemodialysis catheter. Read by: Ascencion Dike PA-C Electronically Signed   By: Corrie Mckusick D.O.   On: 05/25/2017 09:57     Medications:     Scheduled Medications: . aspirin EC  81 mg Oral Daily  . atorvastatin  40 mg Oral q1800  . chlorhexidine  15 mL Mouth Rinse BID  . feeding supplement  1 Container Oral BID BM  . ferrous [id number] C-folic acid  1 capsule Oral BID BM  . mouth rinse  15 mL Mouth Rinse q12n4p  . pantoprazole  40 mg Oral Daily  . polyethylene glycol  17 g Oral Daily  . sevelamer carbonate  800 mg Oral TID WC  . Warfarin - Pharmacist Dosing Inpatient   Does not apply q1800    Infusions: . sodium chloride Stopped (04/30/17 0800)  .  sodium chloride      PRN Medications: heparin, levalbuterol, ondansetron (ZOFRAN) IV, oxyCODONE, simethicone, sodium chloride flush, sodium chloride flush   Patient Profile   David [name] is a 52 y.o. male with a PMH of ICM ('09 PCI to LAD, mRCA, '10 PCI  to Lcx and '12 mRCA), HFrEF (EF ~20% for >5 years) s/p HM-III VAD placement on 12/27 due to end-stage systolic HF.   Assessment/Plan:    1. Acute on chronic systolic HF: Echo 23/76/28 with LVEF 20-25%, Mild MR, Mild/Mod MR, Severe LAE, Mildly reduced RV, PA peak pressure 56 mm Hg.  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 Extubated, initially stopped epinephrine/norepinephrine but restarted with fall in UOP and MAP.  With progressive renal failure, pulmonary edema, and hypotension, he was re-intubated on 12/30.   CVVH was begun.  12/31 was difficult day with refractory shock (MAP in 60s).  Reviewed 12/31 echo which showed no significant pericardial effusion, adequate RV function, and IV septum near mid-line. Off all pressors. Stable. - has not had HD since 05/18/17  - Volume status stable. Will use Torsemide 20 mg M/W/F for home.   - Continue aspirin 81 mg daily.  - Warfarin goal INR 2-2.5.  - INR 2.29 -> 2.9 -> 3.4 Discussed dosing with PharmD personally.  - VAD interrogated personally. Parameters stable.  2. CAD s/p PCI to mid RCA and PLOM with DES x 2 and DES to mid LAD in 1/18.   - No s/s of ischemia. Continue ASA and statin. No change.  3. PAF:  - Remains in NSR.  4. AKI on CKD stage III: Suspect some degree of intrinsic renal disease prior to LVAD placement (baseline creatinine around 1.5) with probable ATN from intra-op/peri-op hypotension and development of vasodilatory/septic shock.  CVVHD stopped 05/04/2017. First iHD 05/06/17 without problems. Tunneled HD cath placed 05/08/17.  Vein mapping done plan for AV graft placement if no recovery.  Last HD on 1/24.   - Creatinine improved to 3.38 ->  2.91 - Discussed with Dr Florene Glen at the bedside. HD catheter removed 05/25/17. - Continue to follow daily. 5. Anemia: s/p 1 u PRBCs 12/30, 04/25/17, 05/02/17.  - Hgb 7.1 today. Will give 1 unit of blood this am and potentially send home this evening.  - Continue aranesp.  - Want to limit transfusion with possibility of transplant down the road. Follow closely.  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 ID: Started on cefepime empirically with septic/vasodilatory shock, PCT was 31 04/25/17.  ABX broadened to Meropenem 04/27/16 with + Achromobacter on tracheal aspirate. Finished 05/07/17 (10 days).  WBCs rising more recently, started on linezolid 1/25 for ?cellulitis around driveline for 5 day course.  - WBCs 19.0-> 18.3 ->16.3 -> 14.2 -> 14.4 - Completed linezolid 05/24/17.  - Perm-cath pulled 05/25/17. 7. Elevated LFTs due to shock liver.   - LFTs improved but albumin low. Suspect compromised liver synthetic function. Encouraged protein intake. No change.  8. Chest/pocket Pain:  - Continue gabapentin 300 mg BID. Improved.  9. Protein-calorie malnutrition - Pre-albumin trending up 12>14.4. No change.   Will continue to discuss with patient and MD. Home today if can be arranged and all parties agree. May be delayed by transfusion.    Length of Stay: [address]  Annamaria Helling 05/26/2017, 1:55 PM  VAD Team --- VAD ISSUES ONLY--- Pager [phone] (7am - 7am) Advanced Heart Failure Team  Pager [phone] (M-F; 7a - 4p)  Please contact [facility] for night-coverage after hours (4p -7a ) and weekends on [email or web address]   Patient seen and examined with the above-signed Advanced Practice Provider and/or Housestaff. I personally reviewed laboratory data, imaging studies and relevant notes. I independently examined the patient and formulated the  important aspects of the plan. I have edited the note to reflect any of my changes or salient points. I have personally discussed the plan with the  patient and/or family.  He is improved today.  Volume status looks good. VAD interrogated personally. Parameters stable. Creatinine continues to improve. No fevers. WBC improving. Eager to go home but hgb is low and family support not in place at home until tomorrow. Will transfuse 1u RBCs today and then send home in am with close f/u in [facility]. Continue linezolid.   Glori Bickers, MD  7:14 PM

## 2017-05-26 NOTE — Progress Notes (Signed)
LVAD Coordinator Rounding Note:  Admitted12/26due to decompensated heart failure.  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  Pt ready to "go home today". Reports his wife was discharged home yesterday after any heart issues were ruled out.   Vital signs: Tmax: 98.8 HR: Doppler Pressure:  Automatic cuff:  O2 Sat: 96 % onRA Wt:151>152>150>146>148>145>142>141>141>140 lbs  LVAD interrogation reveals:  Speed:5400 Flow: 4.9 Power:4.0w PI:3.4 Alarms:none Events:6 PI events Hematocrit:29 Fixed speed:5400 Low speed limit:5100  Drive line site: dressing dry and intact; anchor intact and accurately applied. VAD Coordinator changed dressing today; will advance to every other day dressing change.   VAD dressing removed and site care performed using sterile technique. Drive line exit site cleaned with Chlora prep applicators x 2, allowed to dry, and gauze dressing with aquacel silver strip re-applied. Exit site with partial tissue ingrowth, the velour is fully implanted at exit site.Large scab around drive line; partially removed with cleansing. Less erythema around exit site, no tenderness, drainage, foul odor, or rash noted. Drive line anchor in place; replaced with fresh anchor.      Labs:   LDH trend:263>281>293>224>231>259>242>240>209>207>204>184>183>178  INRtrend:2.52>3.72>4.60>4.76>3.81>3.33>2.99>2.55>2.38>2.19>2.18>2.29>2.96>3.42  WBC trend: 24.0>20.6>19.0>18.3>16.3>14.2>14.4  Hgb trend:  8.7>8.2>8.3>8.0>7.1  Prealbumin:  12.3>14.4  Anticoagulation Plan: -INR Goal:2-2.5 -ASA Dose: 81 mg   Blood Products: Intra op:2 units/FFP 04/20/17  Post QJ:FHLK'T 1 unit 12/29 1 unit 12/30 1 unit 04/25/17 1 unit 05/02/17 1 unit 05/08/17 1 unit 05/26/17  Device:Medtronic BiV -Therapies:on  Arrhythmia: PAF, but in sinus pre-op  (on amiodarone and Eliquis for this) - Afib 12/29 - 04/28/17 - NSR 1/5 - present  Nitric Oxide: - stopped on 12/30 due to initiation of BiPap  Respiratory: - Extubated 12/28 - Reintubated 04/23/2017 - Extubated 04/26/2017  Renal:  04/23/17 - CRRT started for AKI - stopped 05/05/17 -Intermittent HD MWF; now as needed - D/C'd dialysis catheter 05/25/17  Infection: - 04/27/17 + 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 Reviewed logbook with patient today. Recorded wt, temp, and VAD parameters. Instructed him to continue daily recordings at home and bring logbook with him to clinic visits. Pt verbalized understanding of same.  2. Pt has VAD discharge binder for home reference.  3. Wife feels comfortable paging VAD pager if any questions arise at home.    Plan/Recommendations: 1.Please call VAD Pager for any equipment concerns or drive line dressing questions/concerns. 2. VAD coordinator changed dressing today.Will advance to every other day dressing changes.  3. Removed chest tube sutures x 4 today. 4. Plan discharge home today after one unit of blood.  5. Page VAD coordinator if any VAD equipment or drive line/dressing issues or questions.    Zada Girt RN, VAD Coordinator 24/7 pager [phone]

## 2017-05-26 NOTE — Progress Notes (Signed)
[id number] Pt stated he is going home today. Gave pt ex ed for increasing walking at home. Gave low sodium diets and 2000 mg restriction. Referring to Hector Phase 2. Pt stated he has quit smoking x 1 year now.  Congratulated him on this. Graylon Good RN BSN 05/26/2017 1:26 PM

## 2017-05-26 NOTE — Discharge Summary (Addendum)
Advanced Heart Failure Team  Discharge Summary   Patient ID: David [name] MRN: [id number], DOB/AGE: 10-08-1965 52 y.o. 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 2. CAD s/p PCI to mid RCA and PLOM with DES x 2 and DES to mid LAD in 1/18.   3. PAF:  4. AKI on CKD stage III 5. Acute blood loss Anemia: s/p 1 u PRBCs 12/30, 04/25/17, 05/02/17, and 05/26/17. 6. Septic shock with + Achromobacter on tracheal aspirate. -> Completed Zyvox 05/24/17.  7. Elevated LFTs due to shock liver 8. Chest/pocket Pain 9. Protein-calorie malnutrition  Hospital Course:   David [name] is a 52 y.o. malewith a PMH of ICM ('09 PCI to LAD, mRCA, '10 PCI to Lcx and '12 mRCA), HFrEF (EF ~20% for >5 years), HLD and HTN admitted 12/26 for VAD placement due to end-stage systolic HF.   Pt underwent successful HM-3 LVAD placement 04/20/17. AV sewn shut due to AI. Pressors weaned as tolerated.  Developed post op AKI with concerns for sepsis component. Required CVVHD starting 04/23/17. Covered with broad spectrum ABX which were broadened to Meropenem 04/27/16 with + Achromobacter in his tracheal aspirate. CVVHD stopped 1/10 and transitioned to [facility] 1/12 with poor.  Tunneled HD cath placed 05/08/17.  Pt followed daily for HD need. He had gradually increased urine output and kidney function improved so tunneled cath was removed.   Hospital course additionally complicated by bleeding from driveline. Dr. Cyndia Bent placed stitch in superior portion of driveline exit site. He had mild drainage from driveline 6/78/93 and treated with keflex then Augmentin, which was completed on 05/17/17. He had continued leukocytosis so per ID was started on Linezolid to finish 5 days (finished 05/24/17).    Pt also had difficult to manage pain at his surgical site. This improved with titration of his gabapentin, and  was ultimately well controlled on po meds by discharge.    Pt had post op anemia complicated further by his AKI and development of "Anti E antibodies". Transfusion limited where possible, but pt did get total of 6 units PRBCs this admit, plus 2 units of FFP. Hgb down to 7.1 on day of discharge.  Transfusion delayed due to Anti-E antibodies.   Pt offered stay over the weekend with anemia, but adamant he wants to go home today.  Very close follow up made as below.   Held overnight due to late arrival of blood work. Discharge planned for 05/27/17.  LVAD Interrogation HM III HeartMate III LVAD:  Flow 4.6 liters/min, speed 5400, power 4.1, PI 3.4. Occasional PI events.   Discharge Weight: 140 lbs Discharge Vitals: Blood pressure 98/79, pulse 99, temperature 97.8 F (36.6 C), temperature source Oral, resp. rate 15, height 5\' 5"  (1.651 m), weight 140 lb 4.8 oz (63.6 kg), SpO2 98 %.   General:  NAD.  HEENT: normal  Edentulous  Neck: supple. JVP not elevated.  Carotids 2+ bilat; no bruits. No lymphadenopathy or thryomegaly appreciated. Cor: LVAD hum.  Lungs: Clear. Abdomen: obese soft, nontender, non-distended. No hepatosplenomegaly. No bruits or masses. Good bowel sounds. Driveline site clean. Anchor in place.  Extremities: no cyanosis, clubbing, rash. Warm. trace edema  Neuro: alert & oriented x 3. No focal deficits. Moves all 4 without problem    Labs: Lab Results  Component Value Date   WBC 14.4 (H) 05/26/2017   HGB  7.1 (L) 05/26/2017   HCT 23.3 (L) 05/26/2017   MCV 87.3 05/26/2017   PLT 227 05/26/2017    Recent Labs  Lab 05/26/17 0229  NA 137  K 3.7  CL 97*  CO2 29  BUN 29*  CREATININE 2.91*  CALCIUM 8.5*  GLUCOSE 93   Lab Results  Component Value Date   CHOL 117 03/14/2017   HDL 38 (L) 03/14/2017   LDLCALC 60 03/14/2017   TRIG 96 03/14/2017   BNP (last 3 results) Recent Labs    05/10/17 2345 05/17/17 2319 05/25/17 0048  BNP 1,121.7* 1,200.5* 497.9*    ProBNP  (last 3 results) No results for input(s): PROBNP in the last 8760 hours.   Diagnostic Studies/Procedures   Ir Removal Tun Cv Cath W/o Fl  Result Date: 05/25/2017 INDICATION: Acute on chronic renal failure, improved. Request removal of tunneled hemodialysis catheter placed on 05/08/2017. EXAM: REMOVAL OF TUNNELED RIGHT IJ HEMODIALYSIS CATHETER MEDICATIONS: None COMPLICATIONS: None immediate. PROCEDURE: Informed written consent was obtained from the patient following an explanation of the procedure, risks, benefits and alternatives to treatment. A time out was performed prior to the initiation of the procedure. Maximal barrier sterile technique was utilized including caps, mask, sterile gowns, sterile gloves, large sterile drape, hand hygiene, and chlorhexidine. Utilizing a combination of blunt dissection and gentle traction, the cuff of the catheter was exposed and catheter was removed intact. Hemostasis was obtained with manual compression. A dressing was placed. The patient tolerated the procedure well without immediate post procedural complication. IMPRESSION: Successful removal of tunneled right IJ hemodialysis catheter. Read by: Ascencion Dike PA-C Electronically Signed   By: Corrie Mckusick D.O.   On: 05/25/2017 09:57    Discharge Medications   Allergies as of 05/26/2017      Reactions   Plavix [clopidogrel Bisulfate] Hives      Medication List    STOP taking these medications   amiodarone 200 MG tablet Commonly known as:  PACERONE   DOBUTamine 4-5 MG/ML-% infusion Commonly known as:  DOBUTREX   furosemide 80 MG tablet Commonly known as:  LASIX   ivabradine 5 MG Tabs tablet Commonly known as:  CORLANOR   LINZESS 290 MCG Caps capsule Generic drug:  linaclotide   losartan 25 MG tablet Commonly known as:  COZAAR   oxyCODONE-acetaminophen 5-325 MG tablet Commonly known as:  PERCOCET     TAKE these medications   acetaminophen 325 MG tablet Commonly known as:  TYLENOL Take 2  tablets (650 mg total) by mouth every 4 (four) hours as needed for headache or mild pain.   ADULT GUMMY Chew Chew 2 tablets by mouth daily.   aspirin 81 MG chewable tablet Chew 1 tablet (81 mg total) by mouth daily.   atorvastatin 40 MG tablet Commonly known as:  LIPITOR Take 1 tablet (40 mg total) by mouth daily at 6 PM.   ferrous [id number] C-folic acid capsule Commonly known as:  TRINSICON / FOLTRIN Take 1 capsule by mouth 2 (two) times daily between meals. Start taking on:  05/27/2017   Oxycodone HCl 10 MG Tabs Take 1 tablet (10 mg total) by mouth every 6 (six) hours as needed for severe pain.   pantoprazole 40 MG tablet Commonly known as:  PROTONIX Take 1 tablet (40 mg total) by mouth daily. Start taking on:  05/27/2017   warfarin 2 MG tablet Commonly known as:  COUMADIN Take 1 tablet ( 2 mg) daily. Further per LVAD clinic Start taking on:  05/28/2017  Durable Medical Equipment  (From admission, onward)        Start     Ordered   05/17/17 1327  Heart failure home health orders  (Heart failure home health orders / Face to face)  Once    Comments:  Heart Failure Follow-up Care:  Verify follow-up appointments per Patient Discharge Instructions. Confirm transportation arranged. Reconcile home medications with discharge medication list. Remove discontinued medications from use. Assist patient/caregiver to manage medications using pill box. Reinforce low sodium food selection Assessments: Vital signs and oxygen saturation at each visit. Assess home environment for safety concerns, caregiver support and availability of low-sodium foods. Consult Education officer, museum, PT/OT, Dietitian, and CNA based on assessments. Perform comprehensive cardiopulmonary assessment. Notify MD for any change in condition or weight gain of 3 pounds in one day or 5 pounds in one week with symptoms. Daily Weights and Symptom Monitoring: Ensure patient has access to scales. Teach  patient/caregiver to weigh daily before breakfast and after voiding using same scale and record.    Teach patient/caregiver to track weight and symptoms and when to notify Provider. Activity: Develop individualized activity plan with patient/caregiver.   Question Answer Comment  Heart Failure Follow-up Care Advanced Heart Failure (AHF) Clinic at [phone]   Lab frequency Other see comments   Fax lab results to Other see comments   Diet Low Sodium Heart Healthy   Fluid restrictions: 2000 mL Fluid      05/17/17 1327   05/17/17 1127  For home use only DME 4 wheeled rolling walker with seat  Once    Comments:  Pt needs Rollator per cardiac rehab and HF team  Question:  Patient needs a walker to treat with the following condition  Answer:  Mobility impaired   05/17/17 1127      Disposition   The patient will be discharged in stable condition to home with close follow up as below.   Discharge Instructions    (HEART FAILURE PATIENTS) Call MD:  Anytime you have any of the following symptoms: 1) 3 pound weight gain in 24 hours or 5 pounds in 1 week 2) shortness of breath, with or without a dry hacking cough 3) swelling in the hands, feet or stomach 4) if you have to sleep on extra pillows at night in order to breathe.   Complete by:  As directed    Amb Referral to Cardiac Rehabilitation   Complete by:  As directed    Referring to [place] CRP 2   Diagnosis:  Heart Failure (see criteria below if ordering Phase II)   Heart Failure Type:  Chronic Systolic Comment - LVAD   Diet - low sodium heart healthy   Complete by:  As directed    Increase activity slowly   Complete by:  As directed      Follow-up Information    [facility] Follow up on 05/30/2017.   Specialty:  Cardiology Why:  at 1000 am for post hospital follow up. The code for parking is 9001. Can also park in lower ED lot and enter blue awning.  Contact information: [address] [id number] [place] [phone]            Duration of Discharge Encounter: Greater than 35 minutes   Signed, Annamaria Helling  05/26/2017, 4:08 PM   Patient seen and examined with the above-signed Advanced Practice Provider and/or Housestaff. I personally reviewed laboratory data, imaging studies and relevant  notes. I independently examined the patient and formulated the important aspects of the plan. I have edited the note to reflect any of my changes or salient points. I have personally discussed the plan with the patient and/or family.  He is improved today. Eager to go home, Volume status looks good. Creatinine continues to improve. No fevers. WBC improving. Received 1u RBCs yesterday but hgb only up 7.1-> 7.4. No evidence of active bleeding. Will transfuse 1 more unit today and then plane to send home later today with close f/u in [facility]. INR 3.7. Wil discuss dosing with pharmd. Supp mag as well. Will give a dose of Aranesp.   VAD interrogated personally. Parameters stable.  Glori Bickers, MD  8:11 AM

## 2017-05-27 ENCOUNTER — Telehealth (HOSPITAL_COMMUNITY): Payer: Self-pay | Admitting: *Deleted

## 2017-05-27 LAB — PREPARE RBC (CROSSMATCH)

## 2017-05-27 LAB — RENAL FUNCTION PANEL
ANION GAP: 9 (ref 5–15)
Albumin: 1.7 g/dL — ABNORMAL LOW (ref 3.5–5.0)
BUN: 33 mg/dL — ABNORMAL HIGH (ref 6–20)
CALCIUM: 8.5 mg/dL — AB (ref 8.9–10.3)
CHLORIDE: 100 mmol/L — AB (ref 101–111)
CO2: 28 mmol/L (ref 22–32)
Creatinine, Ser: 2.64 mg/dL — ABNORMAL HIGH (ref 0.61–1.24)
GFR calc non Af Amer: 26 mL/min — ABNORMAL LOW (ref >60)
GFR, EST AFRICAN AMERICAN: 30 mL/min — AB (ref >60)
Glucose, Bld: 101 mg/dL — ABNORMAL HIGH (ref 65–99)
POTASSIUM: 3.5 mmol/L (ref 3.5–5.1)
Phosphorus: 3.7 mg/dL (ref 2.5–4.6)
Sodium: 137 mmol/L (ref 135–145)

## 2017-05-27 LAB — CBC
HEMATOCRIT: 23.1 % — AB (ref 39.0–52.0)
HEMOGLOBIN: 7.4 g/dL — AB (ref 13.0–17.0)
MCH: 27.1 pg (ref 26.0–34.0)
MCHC: 32 g/dL (ref 30.0–36.0)
MCV: 84.6 fL (ref 78.0–100.0)
PLATELETS: 184 10*3/uL (ref 150–400)
RBC: 2.73 MIL/uL — AB (ref 4.22–5.81)
RDW: 22.1 % — ABNORMAL HIGH (ref 11.5–15.5)
WBC: 12.5 10*3/uL — AB (ref 4.0–10.5)

## 2017-05-27 LAB — PROTIME-INR
INR: 3.73
Prothrombin Time: 36.6 seconds — ABNORMAL HIGH (ref 11.4–15.2)

## 2017-05-27 LAB — LACTATE DEHYDROGENASE: LDH: 172 U/L (ref 98–192)

## 2017-05-27 LAB — MAGNESIUM: Magnesium: 1.6 mg/dL — ABNORMAL LOW (ref 1.7–2.4)

## 2017-05-27 MED ORDER — SODIUM CHLORIDE 0.9 % IV SOLN: Freq: Once | INTRAVENOUS | Status: DC

## 2017-05-27 MED ORDER — WARFARIN SODIUM 2 MG PO TABS: ORAL_TABLET | ORAL | 3 refills | Status: DC

## 2017-05-27 MED ORDER — DARBEPOETIN ALFA 40 MCG/0.4ML IJ SOSY
40.0000 ug | PREFILLED_SYRINGE | Freq: Once | INTRAMUSCULAR | Status: AC
  Administered 2017-05-27: 40 ug via SUBCUTANEOUS
  Filled 2017-05-27: qty 0.4

## 2017-05-27 MED ORDER — DARBEPOETIN ALFA 40 MCG/0.4ML IJ SOSY: 40.0000 ug | PREFILLED_SYRINGE | INTRAMUSCULAR | Status: DC

## 2017-05-27 MED ORDER — MAGNESIUM SULFATE 4 GM/100ML IV SOLN
4.0000 g | Freq: Once | INTRAVENOUS | Status: AC
  Administered 2017-05-27: 4 g via INTRAVENOUS
  Filled 2017-05-27 (×2): qty 100

## 2017-05-27 NOTE — Discharge Instructions (Signed)

## 2017-05-27 NOTE — Progress Notes (Addendum)
ANTICOAGULATION CONSULT NOTE - Follow Up Consult  Pharmacy Consult for Coumadin and Aranesp Indication: LVAD  Allergies  Allergen Reactions  . Plavix [Clopidogrel Bisulfate] Hives   Patient Measurements: Height: 5\' 5"  (165.1 cm) Weight: 137 lb 2 oz (62.2 kg) IBW/kg (Calculated) : 61.5  Vital Signs: Temp: 98.2 F (36.8 C) (02/02 0721) Temp Source: Oral (02/02 0721) BP: 96/74 (02/02 0721) Pulse Rate: 61 (02/02 0721)  Labs: Recent Labs    05/25/17 0306 05/25/17 1526 05/26/17 0229 05/27/17 0218  HGB 8.0*  --  7.1* 7.4*  HCT 26.4*  --  23.3* 23.1*  PLT 243  --  227 184  LABPROT 30.5*  --  34.2* 36.6*  INR 2.96  --  3.42 3.73  CREATININE 3.38* 3.21* 2.91* 2.64*   Estimated Creatinine Clearance: 28.1 mL/min (A) (by C-G formula based on SCr of 2.64 mg/dL (H)).  Assessment: 52 yo male s/p LVAD implantation 12/27 now resumed on coumadin. INR trended up to 2.96 yesterday and dose held. INR continues to rise to 3.73 today. Hgb down to 7.4 - pt to received pRBCs and pharmacy consulted for Aranesp dosing. Pt previously received 281mcg Aranesp weekly per renal but this was stopped once pt began making own UOP and HD discontinued. No overt S/Sx bleeding per RN.  Goal of Therapy:  INR 2-2.5 Monitor platelets by anticoagulation protocol: Yes   Plan: -Hold Coumadin again tonight -Daily INR -If discharged home, would continue holding warfarin and recheck INR in VAD clinic Mon or Tuesday -Aranesp 40 mcg SQ q7days per pharmacy protocol -Daily CBC  Arrie Senate, PharmD, BCPS PGY-2 Cardiology Pharmacy Resident Pager: [phone] 05/27/2017

## 2017-05-28 ENCOUNTER — Telehealth (HOSPITAL_COMMUNITY): Payer: Self-pay | Admitting: *Deleted

## 2017-05-29 ENCOUNTER — Encounter (HOSPITAL_COMMUNITY): Payer: Self-pay | Admitting: *Deleted

## 2017-05-29 ENCOUNTER — Other Ambulatory Visit (HOSPITAL_COMMUNITY): Payer: Self-pay | Admitting: *Deleted

## 2017-05-29 ENCOUNTER — Encounter (HOSPITAL_COMMUNITY): Payer: Self-pay

## 2017-05-29 DIAGNOSIS — I5043 Acute on chronic combined systolic (congestive) and diastolic (congestive) heart failure: Secondary | ICD-10-CM

## 2017-05-29 DIAGNOSIS — D508 Other iron deficiency anemias: Secondary | ICD-10-CM

## 2017-05-29 DIAGNOSIS — K922 Gastrointestinal hemorrhage, unspecified: Secondary | ICD-10-CM | POA: Diagnosis not present

## 2017-05-29 DIAGNOSIS — Z95811 Presence of heart assist device: Secondary | ICD-10-CM

## 2017-05-29 DIAGNOSIS — Z7901 Long term (current) use of anticoagulants: Secondary | ICD-10-CM

## 2017-05-29 LAB — TYPE AND SCREEN
ABO/RH(D): O POS
Antibody Screen: POSITIVE
DAT, IGG: POSITIVE
Donor AG Type: NEGATIVE
Donor AG Type: NEGATIVE
UNIT DIVISION: 0
UNIT DIVISION: 0
UNIT DIVISION: 0

## 2017-05-29 LAB — BPAM RBC
BLOOD PRODUCT EXPIRATION DATE: 201902092359
Blood Product Expiration Date: 201902142359
Blood Product Expiration Date: 201903042359
ISSUE DATE / TIME: 201902011540
ISSUE DATE / TIME: 201902020949
UNIT TYPE AND RH: 5100
Unit Type and Rh: 5100
Unit Type and Rh: 5100

## 2017-05-29 NOTE — Telephone Encounter (Signed)
Wife called VAD pager to verify dressing changes are to be done every other day. Next dressing change will be due Sunday. Wife verbalized understanding of same.

## 2017-05-29 NOTE — Telephone Encounter (Signed)
Wife called VAD pager to report patient is "throwing up" right now. States he c/o nausea last night, but now is actively throwing up.   Updated Dr. Haroldine Laws - instructed wife to follow BRAT diet. Hold food at this time, then slowly introduce clear liquid as tolerated. Told her to call us if patient unable to keep fluids down. Wife verbalized understanding of same.

## 2017-05-30 ENCOUNTER — Encounter (HOSPITAL_COMMUNITY): Payer: Self-pay

## 2017-05-30 ENCOUNTER — Inpatient Hospital Stay (HOSPITAL_COMMUNITY)
Admission: AD | Admit: 2017-05-30 | Discharge: 2017-06-04 | DRG: 378 | Disposition: A | Discharge status: Home Health | Payer: Medicare HMO | Source: Ambulatory Visit | Attending: Internal Medicine | Admitting: Internal Medicine

## 2017-05-30 ENCOUNTER — Ambulatory Visit (HOSPITAL_COMMUNITY)
Admit: 2017-05-30 | Discharge: 2017-05-30 | Disposition: A | Discharge status: Home / Self Care | Payer: Medicare HMO | Attending: Cardiology | Admitting: Cardiology

## 2017-05-30 ENCOUNTER — Other Ambulatory Visit: Payer: Self-pay

## 2017-05-30 ENCOUNTER — Encounter (HOSPITAL_COMMUNITY): Payer: Self-pay | Admitting: *Deleted

## 2017-05-30 VITALS — BP 84/52 | HR 43 | Resp 16 | Ht 65.0 in | Wt 143.0 lb

## 2017-05-30 DIAGNOSIS — N183 Chronic kidney disease, stage 3 (moderate): Secondary | ICD-10-CM | POA: Diagnosis not present

## 2017-05-30 DIAGNOSIS — I5023 Acute on chronic systolic (congestive) heart failure: Secondary | ICD-10-CM

## 2017-05-30 DIAGNOSIS — Z95811 Presence of heart assist device: Secondary | ICD-10-CM | POA: Diagnosis not present

## 2017-05-30 DIAGNOSIS — K5521 Angiodysplasia of colon with hemorrhage: Secondary | ICD-10-CM | POA: Diagnosis present

## 2017-05-30 DIAGNOSIS — I255 Ischemic cardiomyopathy: Secondary | ICD-10-CM | POA: Diagnosis present

## 2017-05-30 DIAGNOSIS — I2510 Atherosclerotic heart disease of native coronary artery without angina pectoris: Secondary | ICD-10-CM | POA: Diagnosis present

## 2017-05-30 DIAGNOSIS — D62 Acute posthemorrhagic anemia: Secondary | ICD-10-CM | POA: Diagnosis not present

## 2017-05-30 DIAGNOSIS — D508 Other iron deficiency anemias: Secondary | ICD-10-CM

## 2017-05-30 DIAGNOSIS — K31819 Angiodysplasia of stomach and duodenum without bleeding: Secondary | ICD-10-CM | POA: Diagnosis not present

## 2017-05-30 DIAGNOSIS — I5043 Acute on chronic combined systolic (congestive) and diastolic (congestive) heart failure: Secondary | ICD-10-CM

## 2017-05-30 DIAGNOSIS — I5022 Chronic systolic (congestive) heart failure: Secondary | ICD-10-CM | POA: Diagnosis not present

## 2017-05-30 DIAGNOSIS — Z955 Presence of coronary angioplasty implant and graft: Secondary | ICD-10-CM

## 2017-05-30 DIAGNOSIS — F419 Anxiety disorder, unspecified: Secondary | ICD-10-CM | POA: Diagnosis present

## 2017-05-30 DIAGNOSIS — Z7901 Long term (current) use of anticoagulants: Secondary | ICD-10-CM

## 2017-05-30 DIAGNOSIS — Z79899 Other long term (current) drug therapy: Secondary | ICD-10-CM | POA: Diagnosis not present

## 2017-05-30 DIAGNOSIS — E876 Hypokalemia: Secondary | ICD-10-CM | POA: Diagnosis not present

## 2017-05-30 DIAGNOSIS — Z87891 Personal history of nicotine dependence: Secondary | ICD-10-CM | POA: Diagnosis not present

## 2017-05-30 DIAGNOSIS — Q2733 Arteriovenous malformation of digestive system vessel: Secondary | ICD-10-CM | POA: Diagnosis not present

## 2017-05-30 DIAGNOSIS — D649 Anemia, unspecified: Secondary | ICD-10-CM | POA: Diagnosis present

## 2017-05-30 DIAGNOSIS — I480 Paroxysmal atrial fibrillation: Secondary | ICD-10-CM | POA: Diagnosis present

## 2017-05-30 DIAGNOSIS — I252 Old myocardial infarction: Secondary | ICD-10-CM | POA: Diagnosis not present

## 2017-05-30 DIAGNOSIS — E785 Hyperlipidemia, unspecified: Secondary | ICD-10-CM | POA: Diagnosis present

## 2017-05-30 DIAGNOSIS — Z95810 Presence of automatic (implantable) cardiac defibrillator: Secondary | ICD-10-CM | POA: Diagnosis not present

## 2017-05-30 DIAGNOSIS — E8809 Other disorders of plasma-protein metabolism, not elsewhere classified: Secondary | ICD-10-CM | POA: Diagnosis present

## 2017-05-30 DIAGNOSIS — I130 Hypertensive heart and chronic kidney disease with heart failure and stage 1 through stage 4 chronic kidney disease, or unspecified chronic kidney disease: Secondary | ICD-10-CM | POA: Diagnosis present

## 2017-05-30 DIAGNOSIS — K922 Gastrointestinal hemorrhage, unspecified: Secondary | ICD-10-CM

## 2017-05-30 DIAGNOSIS — Z7982 Long term (current) use of aspirin: Secondary | ICD-10-CM | POA: Diagnosis not present

## 2017-05-30 DIAGNOSIS — K921 Melena: Secondary | ICD-10-CM | POA: Diagnosis present

## 2017-05-30 DIAGNOSIS — N179 Acute kidney failure, unspecified: Secondary | ICD-10-CM | POA: Diagnosis present

## 2017-05-30 LAB — CBC
HCT: 13.6 % — ABNORMAL LOW (ref 39.0–52.0)
HEMOGLOBIN: 4 g/dL — AB (ref 13.0–17.0)
MCH: 26.8 pg (ref 26.0–34.0)
MCHC: 29.4 g/dL — ABNORMAL LOW (ref 30.0–36.0)
MCV: 91.3 fL (ref 78.0–100.0)
Platelets: 289 10*3/uL (ref 150–400)
RBC: 1.49 MIL/uL — AB (ref 4.22–5.81)
RDW: 25.7 % — ABNORMAL HIGH (ref 11.5–15.5)
WBC: 25.8 10*3/uL — ABNORMAL HIGH (ref 4.0–10.5)

## 2017-05-30 LAB — HEMOGLOBIN AND HEMATOCRIT, BLOOD
HCT: 13 % — ABNORMAL LOW (ref 39.0–52.0)
HEMOGLOBIN: 3.8 g/dL — AB (ref 13.0–17.0)

## 2017-05-30 LAB — BASIC METABOLIC PANEL
Anion gap: 14 (ref 5–15)
BUN: 31 mg/dL — AB (ref 6–20)
CHLORIDE: 100 mmol/L — AB (ref 101–111)
CO2: 21 mmol/L — ABNORMAL LOW (ref 22–32)
Calcium: 8.2 mg/dL — ABNORMAL LOW (ref 8.9–10.3)
Creatinine, Ser: 2.88 mg/dL — ABNORMAL HIGH (ref 0.61–1.24)
GFR calc Af Amer: 27 mL/min — ABNORMAL LOW (ref >60)
GFR calc non Af Amer: 23 mL/min — ABNORMAL LOW (ref >60)
GLUCOSE: 120 mg/dL — AB (ref 65–99)
Potassium: 4.6 mmol/L (ref 3.5–5.1)
SODIUM: 135 mmol/L (ref 135–145)

## 2017-05-30 LAB — PROTIME-INR
INR: 2.9
Prothrombin Time: 30.1 seconds — ABNORMAL HIGH (ref 11.4–15.2)

## 2017-05-30 LAB — PREPARE RBC (CROSSMATCH)

## 2017-05-30 LAB — MAGNESIUM: MAGNESIUM: 1.7 mg/dL (ref 1.7–2.4)

## 2017-05-30 LAB — LACTATE DEHYDROGENASE: LDH: 213 U/L — AB (ref 98–192)

## 2017-05-30 MED ORDER — FE FUMARATE-B12-VIT C-FA-IFC PO CAPS
1.0000 | ORAL_CAPSULE | Freq: Two times a day (BID) | ORAL | Status: DC
  Administered 2017-05-30 – 2017-06-04 (×11): 1 via ORAL
  Filled 2017-05-30 (×11): qty 1

## 2017-05-30 MED ORDER — ONDANSETRON HCL 4 MG/2ML IJ SOLN
INTRAMUSCULAR | Status: AC
  Filled 2017-05-30: qty 2

## 2017-05-30 MED ORDER — SODIUM CHLORIDE 0.9 % IV SOLN
Freq: Once | INTRAVENOUS | Status: AC
  Administered 2017-06-03: 12:00:00 via INTRAVENOUS

## 2017-05-30 MED ORDER — ATORVASTATIN CALCIUM 40 MG PO TABS
40.0000 mg | ORAL_TABLET | Freq: Every day | ORAL | Status: DC
  Administered 2017-05-30 – 2017-06-03 (×5): 40 mg via ORAL
  Filled 2017-05-30 (×5): qty 1

## 2017-05-30 MED ORDER — SODIUM CHLORIDE 0.9 % IV BOLUS (SEPSIS)
500.0000 mL | Freq: Once | INTRAVENOUS | Status: AC
  Administered 2017-05-30: 500 mL via INTRAVENOUS

## 2017-05-30 MED ORDER — SODIUM CHLORIDE 0.9 % IV SOLN: 10.0000 mL/h | Freq: Once | INTRAVENOUS | Status: DC

## 2017-05-30 MED ORDER — ACETAMINOPHEN 325 MG PO TABS: 650.0000 mg | ORAL_TABLET | ORAL | Status: DC | PRN

## 2017-05-30 MED ORDER — PANTOPRAZOLE SODIUM 40 MG IV SOLR
40.0000 mg | Freq: Two times a day (BID) | INTRAVENOUS | Status: DC
  Administered 2017-05-30 – 2017-06-02 (×7): 40 mg via INTRAVENOUS
  Filled 2017-05-30 (×8): qty 40

## 2017-05-30 MED ORDER — ONDANSETRON HCL 4 MG/2ML IJ SOLN
4.0000 mg | Freq: Four times a day (QID) | INTRAMUSCULAR | Status: DC | PRN
  Administered 2017-05-30: 4 mg via INTRAVENOUS

## 2017-05-30 MED ORDER — PANTOPRAZOLE SODIUM 40 MG PO TBEC: 40.0000 mg | DELAYED_RELEASE_TABLET | Freq: Every day | ORAL | Status: DC

## 2017-05-30 MED ORDER — DOCUSATE SODIUM 100 MG PO CAPS
200.0000 mg | ORAL_CAPSULE | Freq: Every day | ORAL | Status: DC
  Administered 2017-06-04: 200 mg via ORAL
  Filled 2017-05-30: qty 2

## 2017-05-30 MED ORDER — OXYCODONE HCL 5 MG PO TABS
10.0000 mg | ORAL_TABLET | Freq: Four times a day (QID) | ORAL | Status: DC | PRN
Start: 2017-05-30 — End: 2017-06-04
  Administered 2017-05-30 – 2017-06-04 (×17): 10 mg via ORAL
  Filled 2017-05-30 (×17): qty 2

## 2017-05-30 NOTE — H&P (Signed)
Advanced Heart Failure VAD History and Physical Note   Reason for Admission: Symptomatic Anemia   HPI:    David [name] is a 52 year old with history of ICD (09 PCI to LAD, mRCA, '10 PCI to Lcx and '12 mRCA), HFrEF (EF ~20% for >5 years), HLD and HTN. 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   Admitted  12/26 through 05/27/2017 for scheduled LVAD implant. On 12/27 he underwent HMIII LVAD implant. Hospital course complicated by septic shock with achromobacter tracheal aspirate, shock liver, and AKI. Required short term dialysis. Due to improved renal function HD catheter was removed. Discharge hemoglobin was 7.1. He was adamant he was going home on 2/2 and did not want to stay the weekend to further observe hemoglobin.   Today he returned for post hospital follow up. On the day of discharge he was instructed to hold coumadin until 2/3. Today he took coumadin before he arrived for his appointment. Over the weekend he has had N/V and black stools. Complaining of dizziness and fatigue. Denies SOB. Poor appetite.   LVAD INTERROGATION:  HeartMate II LVAD:  Flow 4.8  liters/min, speed 5400, power 4 , PI 2.5     Review of Systems: [y] = yes, [ ]  = no   General: Weight gain [ ] ; Weight loss [ ] ; Anorexia [ ] ; Fatigue [ Y; Fever [ ] ; Chills [ ] ; Weakness [Y ]  Cardiac: Chest pain/pressure [ ] ; Resting SOB [ ] ; Exertional SOB [ ] ; Orthopnea [ ] ; Pedal Edema [ ] ; Palpitations [ ] ; Syncope [ ] ; Presyncope [ ] ; Paroxysmal nocturnal dyspnea[ ]   Pulmonary: Cough [ ] ; Wheezing[ ] ; Hemoptysis[ ] ; Sputum [ ] ; Snoring [ ]   GI: Vomiting[ ] ; Dysphagia[ ] ; Melena[Y ]; Hematochezia [ ] ; Heartburn[ ] ; Abdominal pain [ ] ; Constipation [ ] ; Diarrhea [ ] ; BRBPR [ ]   GU: Hematuria[ ] ; Dysuria [ ] ; Nocturia[ ]   Vascular: Pain in legs with walking [ ] ; Pain in feet with lying flat [ ] ; Non-healing sores [ ] ; Stroke [ ] ; TIA [ ] ; Slurred speech [ ] ;  Neuro: Headaches[ ] ; Vertigo[ ] ; Seizures[ ] ; Paresthesias[ ] ;Blurred vision [ ] ; Diplopia [  ]; Vision changes [ ]   Ortho/Skin: Arthritis [ ] ; Joint pain [ ] ; Muscle pain [ ] ; Joint swelling [ ] ; Back Pain Blue.Reese ]; Rash [ ]   Psych: Depression[ ] ; Anxiety[ ]   Heme: Bleeding problems [ ] ; Clotting disorders [ ] ; Anemia [ ]   Endocrine: Diabetes [ ] ; Thyroid dysfunction[ ]     Home Medications Prior to Admission medications   Medication Sig Start Date End Date Taking? Authorizing Provider  acetaminophen (TYLENOL) 325 MG tablet Take 2 tablets (650 mg total) by mouth every 4 (four) hours as needed for headache or mild pain. 04/13/17   Shirley Friar, PA-C  aspirin 81 MG chewable tablet Chew 1 tablet (81 mg total) by mouth daily. 05/13/16   Shirley Friar, PA-C  atorvastatin (LIPITOR) 40 MG tablet Take 1 tablet (40 mg total) by mouth daily at 6 PM. 03/14/17   Tillery, Satira Mccallum, PA-C  ferrous [id number] C-folic acid (TRINSICON / FOLTRIN) capsule Take 1 capsule by mouth 2 (two) times daily between meals. 05/27/17   Shirley Friar, PA-C  Multiple Vitamins-Minerals (ADULT GUMMY) CHEW Chew 2 tablets by mouth daily.    [provider]  oxyCODONE 10 MG TABS Take 1 tablet (10 mg total) by mouth every 6 (six) hours as needed for severe pain. 05/26/17   Barrington Ellison  Mitzi Hansen, PA-C  pantoprazole (PROTONIX) 40 MG tablet Take 1 tablet (40 mg total) by mouth daily. 05/27/17   Shirley Friar, PA-C  warfarin (COUMADIN) 2 MG tablet Hold coumadin until further notice from LVAD clinic on 2/5. 05/28/17   Theora Gianotti, NP    Past Medical History: Past Medical History:  Diagnosis Date  . AICD (automatic cardioverter/defibrillator) present   . Anemia   . Anxiety   . CAD (coronary artery disease) 2009   AMI in 12/2007 with PCI to LAD, staged PCI to  mid/distal RCA, NSTEMI in 02/2009 with BMS to LCx  . CHF (congestive heart failure) ([place])   . Chronic kidney disease 11/03/2016   stage 3 kidney disease  . Dysrhythmia    "arrythmia problems at some  point", "fatal rhythms"  . History of blood transfusion    "I was bleeding from was rectum" (04/19/2017)  . HLD (hyperlipidemia)   . HTN (hypertension)   . Ischemic cardiomyopathy    Admitted in 07/2010 with CHF exacerbation  . Myocardial infarction ([place])    "I've had 4" (04/19/2017)  . Pneumonia 2018 X 1  . Seizures ([place])    one seizure in 04/2016 during cardiac event (04/19/2017)    Past Surgical History: Past Surgical History:  Procedure Laterality Date  . AORTIC VALVE REPAIR N/A 04/20/2017   Procedure: AORTIC VALVE REPAIR;  Surgeon: Gaye Pollack, MD;  Location: [facility];  Service: Open Heart Surgery;  Laterality: N/A;  . BIV ICD INSERTION CRT-D N/A 08/01/2016   Procedure: BiV ICD Insertion CRT-D;  Surgeon: Will Meredith Leeds, MD;  Location: [facility];  Service: Cardiovascular;  Laterality: N/A;  . CARDIAC CATHETERIZATION N/A 05/05/2016   Procedure: Right/Left Heart Cath and Coronary Angiography;  Surgeon: Jolaine Artist, MD;  Location: [place] [facility];  Service: Cardiovascular;  Laterality: N/A;  . CARDIAC CATHETERIZATION N/A 05/09/2016   Procedure: Coronary Stent Intervention;  Surgeon: Peter M Martinique, MD;  Location: [place] [facility];  Service: Cardiovascular;  Laterality: N/A;  . CARDIAC CATHETERIZATION N/A 05/09/2016   Procedure: Intravascular Pressure Wire/FFR Study;  Surgeon: Peter M Martinique, MD;  Location: [place] [facility];  Service: Cardiovascular;  Laterality: N/A;  . CORONARY ANGIOPLASTY WITH STENT PLACEMENT     "i've got a total of 12 stents" (04/19/2017)  . ELECTROPHYSIOLOGY STUDY N/A 06/24/2016   Procedure: Electrophysiology Study;  Surgeon: Deboraha Sprang, MD;  Location: [place] [facility];  Service: Cardiovascular;  Laterality: N/A;  . EPICARDIAL PACING LEAD PLACEMENT Left 11/07/2016   Procedure: LV EPICARDIAL PACING LEAD PLACEMENT VIA LEFT MINI THORACOTOMY;  Surgeon: Gaye Pollack, MD;  Location: [facility];  Service: Thoracic;  Laterality: Left;  . ICD  IMPLANT N/A 06/24/2016   Procedure: POSSIBLE  ICD Implant;  Surgeon: Deboraha Sprang, MD;  Location: [place];  Service: Cardiovascular;  Laterality: N/A;  . INSERTION OF IMPLANTABLE LEFT VENTRICULAR ASSIST DEVICE N/A 04/20/2017   Procedure: INSERTION OF IMPLANTABLE LEFT VENTRICULAR ASSIST DEVICE, Aortic Valve repair;  Surgeon: Gaye Pollack, MD;  Location: [facility];  Service: Open Heart Surgery;  Laterality: N/A;  Heartmate 3  . IR FLUORO GUIDE CV LINE RIGHT  05/08/2017  . IR FLUORO GUIDE CV MIDLINE PICC RIGHT  03/14/2017  . IR REMOVAL TUN CV CATH W/O FL  05/25/2017  . IR US GUIDE VASC ACCESS RIGHT  03/14/2017  . IR US GUIDE VASC ACCESS RIGHT  05/08/2017  . LEFT HEART CATHETERIZATION WITH CORONARY  ANGIOGRAM N/A 03/13/2011   Procedure: LEFT HEART CATHETERIZATION WITH CORONARY ANGIOGRAM;  Surgeon: Lorretta Harp, MD;  Location: [facility] CATH LAB;  Service: Cardiovascular;  Laterality: N/A;  . MULTIPLE EXTRACTIONS WITH ALVEOLOPLASTY N/A 04/12/2017   Procedure: Extraction of tooth #'s 2, 5-12, 17, 20-29 with alveoloplasty amd maxillary right buccal exostoses reductions.;  Surgeon: Lenn Cal, DDS;  Location: [facility];  Service: Oral Surgery;  Laterality: N/A;  . RIGHT HEART CATH N/A 04/19/2017   Procedure: RIGHT HEART CATH;  Surgeon: Jolaine Artist, MD;  Location: [place] [facility];  Service: Cardiovascular;  Laterality: N/A;  . RIGHT/LEFT HEART CATH AND CORONARY ANGIOGRAPHY N/A 03/10/2017   Procedure: RIGHT/LEFT HEART CATH AND CORONARY ANGIOGRAPHY;  Surgeon: Jolaine Artist, MD;  Location: [place] [facility];  Service: Cardiovascular;  Laterality: N/A;  . TEE WITHOUT CARDIOVERSION N/A 04/20/2017   Procedure: TRANSESOPHAGEAL ECHOCARDIOGRAM (TEE);  Surgeon: Gaye Pollack, MD;  Location: [place];  Service: Open Heart Surgery;  Laterality: N/A;    Family History: Family History  Problem Relation Age of Onset  . Coronary artery disease Father   . Hypertension Father   . Hypertension  Sister   . Coronary artery disease Brother   . Emphysema Brother   . Coronary artery disease Mother   . Hypertension Mother   . Emphysema Mother   . Hypertension Daughter   . Obesity Daughter   . Diabetes Unknown   . Hypertension Unknown   . Coronary artery disease Unknown     Social History: Social History   Socioeconomic History  . Marital status: Married    Spouse name: [name]  . Number of children: 3  . Years of education: Not on file  . Highest education level: Not on file  Social Needs  . Financial resource strain: Not on file  . Food insecurity - worry: Not on file  . Food insecurity - inability: Not on file  . Transportation needs - medical: Not on file  . Transportation needs - non-medical: Not on file  Occupational History  . Not on file  Tobacco Use  . Smoking status: Former Smoker    Packs/day: 0.50    Years: 43.00    Pack years: 21.50    Types: Cigarettes, Cigars    Last attempt to quit: 04/29/2016    Years since quitting: 1.0  . Smokeless tobacco: Never Used  Substance and Sexual Activity  . Alcohol use: No  . Drug use: No  . Sexual activity: Yes  Other Topics Concern  . Not on file  Social History Narrative  . Not on file    Allergies:  Allergies  Allergen Reactions  . Plavix [Clopidogrel Bisulfate] Hives    Objective:    Vital Signs:   BP: ()/()  Arterial Line BP: ()/()    There were no vitals filed for this visit.  Mean arterial Pressure 78  Physical Exam    General:  Appears chronically ill. Pale, No resp difficulty HEENT: Normal Neck: supple. JVP flat  . Carotids 2+ bilat; no bruits. No lymphadenopathy or thyromegaly appreciated. Cor: Mechanical heart sounds with LVAD hum present. Lungs: Clear Abdomen: soft, nontender, nondistended. No hepatosplenomegaly. No bruits or masses. Good bowel sounds. Driveline: C/D/I; securement device intact and driveline incorporated Extremities: no cyanosis, clubbing, rash, edema Neuro: alert &  orientedx3, cranial nerves grossly intact. moves all 4 extremities w/o difficulty. Affect pleasant   Telemetry   SR  EKG   N/a   Labs  Basic Metabolic Panel: Recent Labs  Lab 05/24/17 0257 05/24/17 1604 05/25/17 0306 05/25/17 1526 05/26/17 0229 05/27/17 0218 05/30/17 1000  NA 133* 134* 135 136 137 137 135  K 3.6 3.7 4.1 4.4 3.7 3.5 4.6  CL 92* 92* 94* 95* 97* 100* 100*  CO2 29 30 33* 29 29 28  21*  GLUCOSE 116* 107* 86 89 93 101* 120*  BUN 26* 27* 25* 30* 29* 33* 31*  CREATININE 3.74* 3.70* 3.38* 3.21* 2.91* 2.64* 2.88*  CALCIUM 8.2* 8.4* 8.6* 8.4* 8.5* 8.5* 8.2*  MG 2.1  --  2.0  --  1.7 1.6* 1.7  PHOS 5.6* 5.1* 5.4* 4.7* 4.1 3.7  --     Liver Function Tests: Recent Labs  Lab 05/24/17 1604 05/25/17 0306 05/25/17 1526 05/26/17 0229 05/27/17 0218  ALBUMIN 1.7* 1.7* 1.7* 1.7* 1.7*   No results for input(s): LIPASE, AMYLASE in the last 168 hours. No results for input(s): AMMONIA in the last 168 hours.  CBC: Recent Labs  Lab 05/24/17 0257 05/25/17 0306 05/26/17 0229 05/27/17 0218 05/30/17 1000 05/30/17 1030  WBC 16.3* 14.2* 14.4* 12.5* PENDING  --   HGB 8.3* 8.0* 7.1* 7.4* 4.0* 3.8*  HCT 26.6* 26.4* 23.3* 23.1* 13.6* 13.0*  MCV 87.2 88.0 87.3 84.6 91.3  --   PLT 260 243 227 184 PENDING  --     Cardiac Enzymes: No results for input(s): CKTOTAL, CKMB, CKMBINDEX, TROPONINI in the last 168 hours.  BNP: BNP (last 3 results) Recent Labs    05/10/17 2345 05/17/17 2319 05/25/17 0048  BNP 1,121.7* 1,200.5* 497.9*    ProBNP (last 3 results) No results for input(s): PROBNP in the last 8760 hours.   CBG: No results for input(s): GLUCAP in the last 168 hours.  Coagulation Studies: Recent Labs    05/30/17 1000  LABPROT 30.1*  INR 2.90    Other results: EKG: pending   Imaging     No results found.    Patient Profile:  David [name] is a 52 year old with history of ICD (09 PCI to LAD, mRCA, '10 PCI to Lcx and '12 mRCA), HFrEF (EF ~20%  for >5 years), HLD and HTN.  S/P HMIII LVAD 04/20/18   Admitted today with symptomatic anemia.   Assessment/Plan:    1. Symptomatic Anemia due to GI bleeding/melena  On 2/2 hgb was 7.1. Transfusions have been limited duet anti E antibodies.  Melena over the weekend.  Hgb 3.8. Type and Screen.  Give 3UPRBCs now.  Hold aspirin and coumadin. Had coumadin this morning.   INR 2.9   2. Chronic Systolic Heart Failure with HMIII LVAD implant 04/20/17  Volume status low. Start IV fluids.  No bb. No ace/arb with soft Maps and GI bleed. Off aspirin. Had coumadin today 2/5   3. H/O of AKI on CKD Stage III.  Recent admit required CVVHD-->iHD. Improved with HD catheter removed prior to discharge.  Creatinine 2.88 today. Daily BMET  4.  PAF Maintaining NSR Off anticoagulation.   5. CAD s/p PCI RCA/PLOM with DES x2 and DES to mild LAD 1/18 Continue statin.   Admit to [facility]. GI consulted.     I reviewed the LVAD parameters from today, and compared the results to the patient's prior recorded data.  No programming changes were made.  The LVAD is functioning within specified parameters.  The patient performs LVAD self-test daily.  LVAD interrogation was negative for any significant power changes, alarms or PI events/speed drops.  LVAD equipment check completed  and is in good working order.  Back-up equipment present.   LVAD education done on emergency procedures and precautions and reviewed exit site care.  Length of Stay: 0  Darrick Grinder, NP 05/30/2017, 11:52 AM  VAD Team Pager [phone] (7am - 7am) +++VAD ISSUES ONLY+++   Advanced Heart Failure Team Pager [phone] (M-F; [place])  Please contact [facility] for night-coverage after hours (4p -7a ) and weekends on [email or web address] for all non- LVAD Issues  Patient seen and examined with Darrick Grinder, NP. We discussed all aspects of the encounter. I agree with the assessment and plan as stated above.   52 y/o male with severe HF s/p recent VAD  placement. Presents today with extreme weakness. Hgb 3.8. Has had several episodes of melena. MAPs currently stable.   Admit to [facility] for transfusion. Start PPI. Will need GI to see for EGD and push enteroscopy. Hold warfarin. VAD interrogated personally. Parameters stable despite severe anemia.  Glori Bickers, MD  3:11 PM

## 2017-05-30 NOTE — Progress Notes (Signed)
Patient presents for hospital discharge  follow up in [facility] today. Reports no problems with VAD equipment or concerns with drive line.  States he feels "horrible" and has "no energy". He feels like he has "been in a downward spiral" since discharge. He has reported having nausea, vomiting, and diarrhea. The diarrhea has reportedly been black.  Patient is pale in appearance. He is able to tolerate the wheelchair when moved to clinic room but weak appearing when moving to stretcher.   Reports he took a 2mg  Warfarin this AM as instructed when discharged. Last dose prior to this was 05/26/17 when in hospital.   ICD interrogated to confirm settings during this visit. Also, EKG obtained to confirm heart rate of 100.    Vital Signs:  Doppler Pressure 78   Automatc BP: 84/52 (62) HR:100   SPO2:unable  %  Weight: 143 lb w/o eqt Last weight: 140 lb  VAD Indication: Destination Therapy- CKD    VAD interrogation & Equipment Management: Speed:5400 Flow: 4.8 Power:4.0 w    PI:2.5  Alarms: no clinical alarms Events: 2/3- 32 PI events, 2/4- 8 PI events  Fixed speed 5400 Low speed limit: 5100  Primary Controller:  Replace back up battery in 69months. Back up controller:   Replace back up battery in 30 months.  Annual Equipment Maintenance on UBC/PM was performed on 03/2017.   I reviewed the LVAD parameters from today and compared the results to the patient's prior recorded data. LVAD interrogation was NEGATIVE for significant power changes, NEGATIVE for clinical alarms and STABLE for PI events/speed drops. No programming changes were made and pump is functioning within specified parameters. Pt is performing daily controller and system monitor self tests along with completing weekly and monthly maintenance for LVAD equipment.  LVAD equipment check completed and is in good working order. Back-up equipment present. Charged back up battery and performed self-test on equipment.    Exit Site Care: Drive line is being maintained every other day  by his wife, [name]. Drive line exit site well healed and incorporated. The velour is fully implanted at exit site. Dressing dry and intact. No erythema or drainage. Stabilization device present and accurately applied. Pt denies fever or chills. Pt states they have adequate dressing supplies at home.   Significant Events on VAD Support:  none  Device:[employer] Biv Therapies: on Last check: 05/30/2017   BP & Labs:  MAP78 - Doppler is reflecting modified systolic  Hgb 3.8 - Reports dark stool.  LDH stable at 213 with established baseline of 170- 250. Denies tea-colored urine. No power elevations noted on interrogation.   Plan: 1. Admit to unit 2 central for PRBC infusion 2. Scale given to wife at this visit.   [name] RN [place] Coordinator   Office: [phone] 24/7 Emergency VAD Pager: [phone]

## 2017-05-30 NOTE — Progress Notes (Signed)
CSW met with patient and husband in the clinic. Patient reports feeling very weak and nauseous. Wife shared events of past few days and states "I felt something was wrong". Patient will be admitted due to low hemoglobin and wife feeling overwhelmed with readmission. Wife states "he had one good day". Wife appears concerned and down about admission. CSW provided supportive intervention and continues to follow for support needs. Raquel Sarna, [place], [place]

## 2017-05-30 NOTE — Consult Note (Signed)
[facility] Consult: 2:42 PM 05/30/2017  LOS: 0 days    Referring Provider: Jolaine Artist, MD Primary Care Physician:  Raina Mina., MD Primary Gastroenterologist:  Dr. [place] Cellar    Reason for Consultation:  Symptomatic anemia   HPI: David [name] is a 52 y.o. male. being seen in the hospital for admission following symptomatic anemia- Hgb 3.8. He has PMH of ICD, HFrEF of 20-25%, HLD, and HTN. 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 After LVAD implantation, he required an extended hospital stay d/t septic shock, shock liver, pulmonary edema/hypotension requiring intubation, AKI requiring short term dialysis. He also experienced post-op anemia for which he required 9 units of prbc and 4 units of plasma. He was adamant to be discharged on 2/2, at this time his Hgb was 7.1 and he was discharged home with close f/u at [facility]. He was instructed to hold his coumadin until 2/3 and at his f/u appt today he reported that he took first dose of coumadin 2 mg since discharge this AM before his appt. At his appt this morning he appeared chronically ill, pale, with flat JVP. Hgb was measured and was 3.8., aspirin and coumadin were held, INR 2.9, order placed by Dr. Haroldine Laws to transfuse 3 units of prbc. He was also started on IV fluids for low volume, scr today 2.88. LVAD parameters functioning within normal limits.   In the hospital, he reports feeling like he has been "hit by a mac truck". Reports extreme fatigue, dizziness with one episode of fall on 2/4 w/o loss of consciousness or injury. He says he has poor appetite, nausea, vomiting that is green in color- last episode 2/4, and black loose stools. He says that these sxs were present during his hospital stay and have gotten progressively worse since  discharge. He has some msk related CP since the surgery worse with coughing, vomiting, or other pronounced movements. Denies fever, HA, hematemesis, dysuria, hematuria, or abdominal pain.   He has never had a colonoscopy, he has had one endoscopy over 5 years ago at [facility]. Pt and wife not sure if they found any underlying pathologies at that time.   Past Medical History:  Diagnosis Date  . AICD (automatic cardioverter/defibrillator) present   . Anemia   . Anxiety   . CAD (coronary artery disease) 2009   AMI in 12/2007 with PCI to LAD, staged PCI to  mid/distal RCA, NSTEMI in 02/2009 with BMS to LCx  . CHF (congestive heart failure) ([place])   . Chronic kidney disease 11/03/2016   stage 3 kidney disease  . Dysrhythmia    "arrythmia problems at some point", "fatal rhythms"  . History of blood transfusion    "I was bleeding from was rectum" (04/19/2017)  . HLD (hyperlipidemia)   . HTN (hypertension)   . Ischemic cardiomyopathy    Admitted in 07/2010 with CHF exacerbation  . Myocardial infarction ([place])    "I've had 4" (04/19/2017)  . Pneumonia 2018 X 1  . Seizures ([place])    one seizure in 04/2016 during  cardiac event (04/19/2017)    Past Surgical History:  Procedure Laterality Date  . AORTIC VALVE REPAIR N/A 04/20/2017   Procedure: AORTIC VALVE REPAIR;  Surgeon: Gaye Pollack, MD;  Location: [place];  Service: Open Heart Surgery;  Laterality: N/A;  . BIV ICD INSERTION CRT-D N/A 08/01/2016   Procedure: BiV ICD Insertion CRT-D;  Surgeon: Will Meredith Leeds, MD;  Location: [place] CV LAB;  Service: Cardiovascular;  Laterality: N/A;  . CARDIAC CATHETERIZATION N/A 05/05/2016   Procedure: Right/Left Heart Cath and Coronary Angiography;  Surgeon: Jolaine Artist, MD;  Location: [place] [facility];  Service: Cardiovascular;  Laterality: N/A;  . CARDIAC CATHETERIZATION N/A 05/09/2016   Procedure: Coronary Stent Intervention;  Surgeon: Peter M Martinique, MD;  Location: [place] [facility]  [facility];  Service: Cardiovascular;  Laterality: N/A;  . CARDIAC CATHETERIZATION N/A 05/09/2016   Procedure: Intravascular Pressure Wire/FFR Study;  Surgeon: Peter M Martinique, MD;  Location: [place] [facility] LAB;  Service: Cardiovascular;  Laterality: N/A;  . CORONARY ANGIOPLASTY WITH STENT PLACEMENT     "i've got a total of 12 stents" (04/19/2017)  . ELECTROPHYSIOLOGY STUDY N/A 06/24/2016   Procedure: Electrophysiology Study;  Surgeon: Deboraha Sprang, MD;  Location: [place] [facility];  Service: Cardiovascular;  Laterality: N/A;  . EPICARDIAL PACING LEAD PLACEMENT Left 11/07/2016   Procedure: LV EPICARDIAL PACING LEAD PLACEMENT VIA LEFT MINI THORACOTOMY;  Surgeon: Gaye Pollack, MD;  Location: [facility];  Service: Thoracic;  Laterality: Left;  . ICD IMPLANT N/A 06/24/2016   Procedure: POSSIBLE  ICD Implant;  Surgeon: Deboraha Sprang, MD;  Location: [place] [facility] LAB;  Service: Cardiovascular;  Laterality: N/A;  . INSERTION OF IMPLANTABLE LEFT VENTRICULAR ASSIST DEVICE N/A 04/20/2017   Procedure: INSERTION OF IMPLANTABLE LEFT VENTRICULAR ASSIST DEVICE, Aortic Valve repair;  Surgeon: Gaye Pollack, MD;  [facility];  Service: Open Heart Surgery;  Laterality: N/A;  Heartmate 3  . IR FLUORO GUIDE CV LINE RIGHT  05/08/2017  . IR FLUORO GUIDE CV MIDLINE PICC RIGHT  03/14/2017  . IR REMOVAL TUN CV CATH W/O FL  05/25/2017  . IR US GUIDE VASC ACCESS RIGHT  03/14/2017  . IR US GUIDE VASC ACCESS RIGHT  05/08/2017  . LEFT HEART CATHETERIZATION WITH CORONARY ANGIOGRAM N/A 03/13/2011   Procedure: LEFT HEART CATHETERIZATION WITH CORONARY ANGIOGRAM;  Surgeon: Lorretta Harp, MD;  Location: [facility] CATH LAB;  Service: Cardiovascular;  Laterality: N/A;  . MULTIPLE EXTRACTIONS WITH ALVEOLOPLASTY N/A 04/12/2017   Procedure: Extraction of tooth #'s 2, 5-12, 17, 20-29 with alveoloplasty amd maxillary right buccal exostoses reductions.;  Surgeon: Lenn Cal, DDS;  Location: [facility];  Service: Oral Surgery;  Laterality: N/A;  .  RIGHT HEART CATH N/A 04/19/2017   Procedure: RIGHT HEART CATH;  Surgeon: Jolaine Artist, MD;  Location: [place] [facility] LAB;  Service: Cardiovascular;  Laterality: N/A;  . RIGHT/LEFT HEART CATH AND CORONARY ANGIOGRAPHY N/A 03/10/2017   Procedure: RIGHT/LEFT HEART CATH AND CORONARY ANGIOGRAPHY;  Surgeon: Jolaine Artist, MD;  Location: [place] CV LAB;  Service: Cardiovascular;  Laterality: N/A;  . TEE WITHOUT CARDIOVERSION N/A 04/20/2017   Procedure: TRANSESOPHAGEAL ECHOCARDIOGRAM (TEE);  Surgeon: Gaye Pollack, MD;  Location: [place];  Service: Open Heart Surgery;  Laterality: N/A;    Prior to Admission medications   Medication Sig Start Date End Date Taking? Authorizing Provider  acetaminophen (TYLENOL) 325 MG tablet Take 2 tablets (650 mg total) by mouth every 4 (four) hours  as needed for headache or mild pain. 04/13/17  Yes Shirley Friar, PA-C  aspirin 81 MG chewable tablet Chew 1 tablet (81 mg total) by mouth daily. 05/13/16  Yes Shirley Friar, PA-C  atorvastatin (LIPITOR) 40 MG tablet Take 1 tablet (40 mg total) by mouth daily at 6 PM. 03/14/17  Yes Tillery, Satira Mccallum, PA-C  ferrous [id number] C-folic acid (TRINSICON / FOLTRIN) capsule Take 1 capsule by mouth 2 (two) times daily between meals. 05/27/17  Yes Shirley Friar, PA-C  Multiple Vitamins-Minerals (ADULT GUMMY) CHEW Chew 2 tablets by mouth daily.   Yes [provider]  oxyCODONE 10 MG TABS Take 1 tablet (10 mg total) by mouth every 6 (six) hours as needed for severe pain. 05/26/17  Yes Shirley Friar, PA-C  pantoprazole (PROTONIX) 40 MG tablet Take 1 tablet (40 mg total) by mouth daily. 05/27/17  Yes Shirley Friar, PA-C  warfarin (COUMADIN) 2 MG tablet Hold coumadin until further notice from LVAD clinic on 2/5. Patient taking differently: Take 2 mg by mouth one time only at 6 PM. Hold coumadin until further notice from LVAD clinic on 2/5. 05/28/17  Yes Theora Gianotti, NP    Scheduled Meds: . atorvastatin  40 mg Oral q1800  . docusate sodium  200 mg Oral Daily  . ferrous [id number] C-folic acid  1 capsule Oral BID BM  . pantoprazole  40 mg Oral Daily   Infusions: . sodium chloride    . sodium chloride     PRN Meds: acetaminophen, ondansetron (ZOFRAN) IV, oxyCODONE   Allergies as of 05/30/2017 - Review Complete 05/30/2017  Allergen Reaction Noted  . Plavix [clopidogrel bisulfate] Hives 03/13/2011    Family History  Problem Relation Age of Onset  . Coronary artery disease Father   . Hypertension Father   . Hypertension Sister   . Coronary artery disease Brother   . Emphysema Brother   . Coronary artery disease Mother   . Hypertension Mother   . Emphysema Mother   . Hypertension Daughter   . Obesity Daughter   . Diabetes Unknown   . Hypertension Unknown   . Coronary artery disease Unknown     Social History   Socioeconomic History  . Marital status: Married    Spouse name: [name]  . Number of children: 3  . Years of education: Not on file  . Highest education level: Not on file  Social Needs  . Financial resource strain: Not on file  . Food insecurity - worry: Not on file  . Food insecurity - inability: Not on file  . Transportation needs - medical: Not on file  . Transportation needs - non-medical: Not on file  Occupational History  . Not on file  Tobacco Use  . Smoking status: Former Smoker    Packs/day: 0.50    Years: 43.00    Pack years: 21.50    Types: Cigarettes, Cigars    Last attempt to quit: 04/29/2016    Years since quitting: 1.0  . Smokeless tobacco: Never Used  Substance and Sexual Activity  . Alcohol use: No  . Drug use: No  . Sexual activity: Yes  Other Topics Concern  . Not on file  Social History Narrative  . Not on file    REVIEW OF SYSTEMS: Constitutional:  Positive for dizziness, fatigue, weakness, falls. Negative for loss of consciousness or headache. HEENT:   No nose bleeds or vision changes. Pulm:  No SOB, CP, or dyspnea. CV:  No palpitations, no LE edema.  GU:  No hematuria, no frequency GI:  Positive for loss of appetite, nausea, vomiting, diarrhea, melena. Negative for dysphagia or constipation. Heme:  Black stools MSK: Chest wall pain since LVAD surgery, worse w/movement or increased intrathoracic pressure Transfusions:  3 units PRBCs on 2/5 Neuro:  No headaches, no peripheral tingling or numbness Travel:  None beyond local counties in last few months.    PHYSICAL EXAM: Vital signs in last 24 hours: Vitals:   05/30/17 1427  BP: (!) 101/26  Pulse: 99  Resp: 16  Temp: 97.9 F (36.6 C)  SpO2: 97%   Wt Readings from Last 3 Encounters:  05/30/17 64.9 kg (143 lb)  05/27/17 62.2 kg (137 lb 2 oz)  04/13/17 72.6 kg (160 lb 1.6 oz)    General: slightly ill/weak appearing Head:  Atraumatic and normocephalic Eyes:  Conjunctiva slightly pale, EOMs intact, PERRLA. Nose:  No evidence of nose bleeds Mouth:  Mildly dry mucous membranes Lungs:  CTAB, no accessory muscle use Heart: LVAD hum heard Abdomen:  Soft, non-tender, positive BS, no hepatosplenomegaly, no bruits Extremities:  No cyanosis, edema, distal pulses intact Neurologic:  AOx3 Psych:  Pleasant and very kind  Intake/Output from previous day: No intake/output data recorded. Intake/Output this shift: No intake/output data recorded.  LAB RESULTS: Recent Labs    05/30/17 1000 05/30/17 1030  WBC 25.8*  --   HGB 4.0* 3.8*  HCT 13.6* 13.0*  PLT 289  --    BMET Lab Results  Component Value Date   NA 135 05/30/2017   NA 137 05/27/2017   NA 137 05/26/2017   K 4.6 05/30/2017   K 3.5 05/27/2017   K 3.7 05/26/2017   CL 100 (L) 05/30/2017   CL 100 (L) 05/27/2017   CL 97 (L) 05/26/2017   CO2 21 (L) 05/30/2017   CO2 28 05/27/2017   CO2 29 05/26/2017   GLUCOSE 120 (H) 05/30/2017   GLUCOSE 101 (H) 05/27/2017   GLUCOSE 93 05/26/2017   BUN 31 (H) 05/30/2017   BUN 33  (H) 05/27/2017   BUN 29 (H) 05/26/2017   CREATININE 2.88 (H) 05/30/2017   CREATININE 2.64 (H) 05/27/2017   CREATININE 2.91 (H) 05/26/2017   CALCIUM 8.2 (L) 05/30/2017   CALCIUM 8.5 (L) 05/27/2017   CALCIUM 8.5 (L) 05/26/2017   LFT No results for input(s): PROT, ALBUMIN, AST, ALT, ALKPHOS, BILITOT, BILIDIR, IBILI in the last 72 hours. PT/INR Lab Results  Component Value Date   INR 2.90 05/30/2017   INR 3.73 05/27/2017   INR 3.42 05/26/2017   Hepatitis Panel No results for input(s): HEPBSAG, HCVAB, HEPAIGM, HEPBIGM in the last 72 hours. C-Diff No components found for: CDIFF Lipase  No results found for: LIPASE  Drugs of Abuse     Component Value Date/Time   LABOPIA (A) 03/06/2009 0530    POSITIVE (NOTE) Result repeated and verified. Sent for confirmatory testing   COCAINSCRNUR NEGATIVE 03/06/2009 0530   LABBENZ (A) 03/06/2009 0530    POSITIVE (NOTE) Result repeated and verified. Sent for confirmatory testing   AMPHETMU NEGATIVE 03/06/2009 0530     RADIOLOGY STUDIES: No results found.  ENDOSCOPIC STUDIES: Reports he had an EGD at [facility] years ago (>5 years), cannot recall the results of EGD  IMPRESSION:    Symptomatic anemia- likely secondary to acute blood loss- aspirin and coumadin being held, last dose coumadin this AM. He is to recieve 3 units of PRBC upon admission. Blood type O-positive. This  AM- Hgb 3.8, RBC 1.49, Hct 13.0. Platelets wnl. PT this AM was 30.1, INR 2.9 (wnl).   AKI on CKD stg III: likely secondary to dehydration and low volume. Scr this AM was 2.88, BUN 31, GFR 27.   Leukocytosis: WBC this AM was 25.8, likely immune response due to acute blood loss, hx of LVAD surgery, septic shock, cellulitis could also be contributing factors. WBC on 2/1 was 12.5   Hypocalcemia/Hypochloremia- likely d/t dehydration, acute blood loss, and low blood volume. Continue monitoring.    Hx of HFrEF- s/p LVAD placement, LVAD funtioning within  parameters   CAD and HLD: Continue statin   HTN- pt currently hypotensive, most recent BP 84/52, pulse 43. Continue IVF and monitor. No BB or ACEI/ARB with soft MAPs and GI bleed.     PLAN:     Transfuse pt with 3 PRBCs today, check post transfusion H&H. Recheck PT/INR tomorrow AM.   Pt will likely need EGD with possible enteroscope, depends on INR and Hgb (min of 7 needed before endoscopy).   Continue daily checks of CBC, BMP, vitals, I&Os.    Posey Pronto  05/30/2017, 2:42 PM Pager: [phone]

## 2017-05-31 ENCOUNTER — Inpatient Hospital Stay (HOSPITAL_COMMUNITY): Admission: RE | Admit: 2017-05-31 | Payer: Self-pay | Source: Ambulatory Visit

## 2017-05-31 ENCOUNTER — Encounter (HOSPITAL_COMMUNITY): Payer: Self-pay

## 2017-05-31 ENCOUNTER — Encounter (HOSPITAL_COMMUNITY): Payer: Self-pay | Admitting: *Deleted

## 2017-05-31 DIAGNOSIS — Z7901 Long term (current) use of anticoagulants: Secondary | ICD-10-CM

## 2017-05-31 DIAGNOSIS — K921 Melena: Secondary | ICD-10-CM

## 2017-05-31 DIAGNOSIS — K922 Gastrointestinal hemorrhage, unspecified: Secondary | ICD-10-CM

## 2017-05-31 LAB — HEMOGLOBIN AND HEMATOCRIT, BLOOD
HEMATOCRIT: 25.3 % — AB (ref 39.0–52.0)
Hemoglobin: 8.3 g/dL — ABNORMAL LOW (ref 13.0–17.0)

## 2017-05-31 LAB — BASIC METABOLIC PANEL
ANION GAP: 9 (ref 5–15)
BUN: 28 mg/dL — ABNORMAL HIGH (ref 6–20)
CALCIUM: 8 mg/dL — AB (ref 8.9–10.3)
CHLORIDE: 104 mmol/L (ref 101–111)
CO2: 22 mmol/L (ref 22–32)
Creatinine, Ser: 2.59 mg/dL — ABNORMAL HIGH (ref 0.61–1.24)
GFR calc non Af Amer: 27 mL/min — ABNORMAL LOW (ref >60)
GFR, EST AFRICAN AMERICAN: 31 mL/min — AB (ref >60)
Glucose, Bld: 95 mg/dL (ref 65–99)
Potassium: 4.3 mmol/L (ref 3.5–5.1)
SODIUM: 135 mmol/L (ref 135–145)

## 2017-05-31 LAB — CBC
HEMATOCRIT: 23.5 % — AB (ref 39.0–52.0)
Hemoglobin: 7.7 g/dL — ABNORMAL LOW (ref 13.0–17.0)
MCH: 29.2 pg (ref 26.0–34.0)
MCHC: 32.8 g/dL (ref 30.0–36.0)
MCV: 89 fL (ref 78.0–100.0)
Platelets: 210 10*3/uL (ref 150–400)
RBC: 2.64 MIL/uL — ABNORMAL LOW (ref 4.22–5.81)
RDW: 17.9 % — AB (ref 11.5–15.5)
WBC: 16.8 10*3/uL — ABNORMAL HIGH (ref 4.0–10.5)

## 2017-05-31 LAB — PROTIME-INR
INR: 3.27
Prothrombin Time: 33 seconds — ABNORMAL HIGH (ref 11.4–15.2)

## 2017-05-31 LAB — PREPARE RBC (CROSSMATCH)

## 2017-05-31 LAB — LACTATE DEHYDROGENASE: LDH: 202 U/L — ABNORMAL HIGH (ref 98–192)

## 2017-05-31 MED ORDER — BOOST / RESOURCE BREEZE PO LIQD CUSTOM
1.0000 | Freq: Two times a day (BID) | ORAL | Status: DC
  Administered 2017-05-31 – 2017-06-04 (×8): 1 via ORAL

## 2017-05-31 MED ORDER — FUROSEMIDE 80 MG PO TABS
80.0000 mg | ORAL_TABLET | Freq: Every day | ORAL | Status: DC
  Administered 2017-05-31 – 2017-06-04 (×5): 80 mg via ORAL
  Filled 2017-05-31 (×5): qty 1

## 2017-05-31 NOTE — Progress Notes (Signed)
LVAD Coordinator Rounding Note:  Admitted 2/5/2019due to symptomatic anemia.  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  Vital signs: Tmax: 98.8 HR:84 Doppler Pressure: 86 Automatic cuff: 89/79 (84) O2 Sat: 92 % onRA Wt:140>149 lbs  LVAD interrogation reveals:  Speed:5400 Flow: 4.8 Power:4.0w PI:3.2 Alarms:none Events:none Hematocrit:24 Fixed speed:5400 Low speed limit:5100  Drive line site: dressing dry and intact; anchor intact and accurately applied. VAD Coordinator will change dressing tomorrow. Due every other day using daily kit.  Labs:   LDH trend:202  INRtrend:3.27   Anticoagulation Plan: -INR Goal:2-2.5 -ASA Dose: 81 mg   Blood Products: 3 units PRBC 05/30/2017  Device:Medtronic BiV -Therapies:on - interrogated in clinic 05/30/17  Plan/Recommendations: 1.Please call VAD Pager for any equipment concerns or drive line dressing questions/concerns. 2. VAD coordinator changed dressing yesterday.Will change tomorrow 3. Plan for possible endoscopy tomorrow per GI  [name] RN, VAD Coordinator 24/7 pager [phone]

## 2017-05-31 NOTE — Progress Notes (Signed)
Advanced Heart Failure VAD Team Note  Subjective:    Admitted with symptomatic anemia. Hgb on admit 3.8. Received 3UPRBCs. Hgb with appropriate rise from 3.8> 7.7. Had black BM this morning. Poor appetite.   Feeling better today. Denies SOB. No ab pain. Denies SOB.    LVAD INTERROGATION:  HeartMate II LVAD:  Flow 4.5 liters/min, speed 5400, power 4 PI 3.1    Objective:    Vital Signs:   Temp:  [97.9 F (36.6 C)-99.5 F (37.5 C)] 98.8 F (37.1 C) (02/06 0715) Pulse Rate:  [87-99] 87 (02/05 2324) Resp:  [16-24] 18 (02/06 0715) BP: (59-101)/(19-79) 89/79 (02/06 0715) SpO2:  [91 %-100 %] 91 % (02/06 0715) Weight:  [140 lb 6.9 oz (63.7 kg)-149 lb 11.1 oz (67.9 kg)] 149 lb 11.1 oz (67.9 kg) (02/06 0414) Last BM Date: 05/29/17 Mean arterial Pressure 70-80s   Intake/Output:   Intake/Output Summary (Last 24 hours) at 05/31/2017 0850 Last data filed at 05/31/2017 0413 Gross per 24 hour  Intake 1577.5 ml  Output 800 ml  Net 777.5 ml     Physical Exam    General:  No resp difficulty HEENT: normal Neck: supple. JVP 8. Carotids 2+ bilat; no bruits. No lymphadenopathy or thyromegaly appreciated. Cor: Mechanical heart sounds with LVAD hum present. Lungs: clear Abdomen: soft, nontender, nondistended. No hepatosplenomegaly. No bruits or masses. Good bowel sounds. Driveline: C/D/I; securement device intact and driveline incorporated Extremities: no cyanosis, clubbing, rash, edema Neuro: alert & orientedx3, cranial nerves grossly intact. moves all 4 extremities w/o difficulty. Affect pleasant   Telemetry   NSR 70-80s Personally reviewed   EKG    None   Labs   Basic Metabolic Panel: Recent Labs  Lab 05/24/17 1604 05/25/17 0306 05/25/17 1526 05/26/17 0229 05/27/17 0218 05/30/17 1000 05/31/17 0232  NA 134* 135 136 137 137 135 135  K 3.7 4.1 4.4 3.7 3.5 4.6 4.3  CL 92* 94* 95* 97* 100* 100* 104  CO2 30 33* 29 29 28  21* 22  GLUCOSE 107* 86 89 93 101* 120* 95  BUN 27*  25* 30* 29* 33* 31* 28*  CREATININE 3.70* 3.38* 3.21* 2.91* 2.64* 2.88* 2.59*  CALCIUM 8.4* 8.6* 8.4* 8.5* 8.5* 8.2* 8.0*  MG  --  2.0  --  1.7 1.6* 1.7  --   PHOS 5.1* 5.4* 4.7* 4.1 3.7  --   --     Liver Function Tests: Recent Labs  Lab 05/24/17 1604 05/25/17 0306 05/25/17 1526 05/26/17 0229 05/27/17 0218  ALBUMIN 1.7* 1.7* 1.7* 1.7* 1.7*   No results for input(s): LIPASE, AMYLASE in the last 168 hours. No results for input(s): AMMONIA in the last 168 hours.  CBC: Recent Labs  Lab 05/25/17 0306 05/26/17 0229 05/27/17 0218 05/30/17 1000 05/30/17 1030 05/31/17 0232  WBC 14.2* 14.4* 12.5* 25.8*  --  16.8*  HGB 8.0* 7.1* 7.4* 4.0* 3.8* 7.7*  HCT 26.4* 23.3* 23.1* 13.6* 13.0* 23.5*  MCV 88.0 87.3 84.6 91.3  --  89.0  PLT 243 227 184 289  --  210    INR: Recent Labs  Lab 05/25/17 0306 05/26/17 0229 05/27/17 0218 05/30/17 1000 05/31/17 0232  INR 2.96 3.42 3.73 2.90 3.27    Other results:  EKG:    Imaging    No results found.   Medications:     Scheduled Medications: . atorvastatin  40 mg Oral q1800  . docusate sodium  200 mg Oral Daily  . ferrous [id number] C-folic acid  1 capsule Oral  BID BM  . pantoprazole (PROTONIX) IV  40 mg Intravenous Q12H     Infusions: . sodium chloride       PRN Medications:  acetaminophen, ondansetron (ZOFRAN) IV, oxyCODONE   Patient Profile   Mr David [name] is a 52 year old with history of ICD (09 PCI to LAD, mRCA, '10 PCI to Lcx and '12 mRCA), HFrEF (EF ~20% for >5 years), HLD and HTN.  S/P HMIII LVAD 04/20/18   Admitted today with symptomatic anemia.     Assessment/Plan:    1. Symptomatic Anemia due to GI bleeding/melena  On 2/2 hgb was 7.1. Transfusions have been limited duet anti E antibodies.  Melena over the weekend.  Hgb 3.8 on admit. Received 3PURBCs. Hgb today up to 7.7 GI following. Plan for enteroscopy tomorrow.  Hold aspirin and coumadin. Had coumadin this morning.   INR 3.27     2. Chronic Systolic Heart Failure with HMIII LVAD implant 04/20/17  Volume status elevated. Start 80 mg po lasix daily.  No bb. No ace/arb with soft Maps and GI bleed. Off aspirin. Had coumadin today 2/5   3. H/O of AKI on CKD Stage III.  Recent admit required CVVHD-->iHD. Improved with HD catheter removed prior to discharge.  Creatinine down from 2.8>2.59  Daily BMET  4.  PAF Maintaining NSR Off anticoagulation.   5. CAD s/p PCI RCA/PLOM with DES x2 and DES to mild LAD 1/18 Continue statin.   Consult cardiac rehab. Add Breeze supplements.   I reviewed the LVAD parameters from today, and compared the results to the patient's prior recorded data.  No programming changes were made.  The LVAD is functioning within specified parameters.  The patient performs LVAD self-test daily.  LVAD interrogation was negative for any significant power changes, alarms or PI events/speed drops.  LVAD equipment check completed and is in good working order.  Back-up equipment present.   LVAD education done on emergency procedures and precautions and reviewed exit site care.  Length of Stay: 1  Darrick Grinder, NP 05/31/2017, 8:50 AM  VAD Team --- VAD ISSUES ONLY--- Pager [phone] (7am - 7am)  Advanced Heart Failure Team  Pager [phone] (M-F; 7a - 4p)  Please contact [facility] for night-coverage after hours (4p -7a ) and weekends on [email or web address]  Patient seen and examined with Darrick Grinder, NP. We discussed all aspects of the encounter. I agree with the assessment and plan as stated above.   Sitting up in bed NAD JVP 8-9 Cor LVAD hum Lungs clear Ab benign driveline site ok Ext warm   Hgb much improved after transfusions but still with melena. GI has seen and will scope once INR down. (3.27 today). Continue protonix. Renal function improving slowly. Volume up after transfusion. Diurese carefully. VAD interrogated personally. Parameters stable. Driveline site ok.   Glori Bickers, MD  6:43  PM

## 2017-05-31 NOTE — Progress Notes (Signed)
Came to ambulate however pt sts he is too weak. Declines recliner as well. Encouraged him to walk with nursing when he feels like it. Encouraged mobility. Will f/u as time allows. Yves Dill CES, ACSM 3:01 PM 05/31/2017

## 2017-05-31 NOTE — Progress Notes (Signed)
      Progress Note   Subjective  Patient states he feels much better since having blood transfusion. Had 3 units PRBC, Hgb rose from 3.7 to 7.7. He had one BM yesterday, and having another one right now when on rounds, he states it is dark   Objective   Vital signs in last 24 hours: Temp:  [97.9 F (36.6 C)-99.5 F (37.5 C)] 98.3 F (36.8 C) (02/06 0400) Pulse Rate:  [87-99] 87 (02/05 2324) Resp:  [16-24] 22 (02/06 0400) BP: (59-101)/(19-78) 95/67 (02/06 0400) SpO2:  [92 %-100 %] 94 % (02/06 0400) Weight:  [140 lb 6.9 oz (63.7 kg)-149 lb 11.1 oz (67.9 kg)] 149 lb 11.1 oz (67.9 kg) (02/06 0414) Last BM Date: 05/29/17  Exam not done, patient on commode at present  Intake/Output from previous day: 02/05 0701 - 02/06 0700 In: 1577.5 [P.O.:480; I.V.:100; Blood:997.5] Out: 800 [Urine:800] Intake/Output this shift: No intake/output data recorded.  Lab Results: Recent Labs    05/30/17 1000 05/30/17 1030 05/31/17 0232  WBC 25.8*  --  16.8*  HGB 4.0* 3.8* 7.7*  HCT 13.6* 13.0* 23.5*  PLT 289  --  210   BMET Recent Labs    05/30/17 1000 05/31/17 0232  NA 135 135  K 4.6 4.3  CL 100* 104  CO2 21* 22  GLUCOSE 120* 95  BUN 31* 28*  CREATININE 2.88* 2.59*  CALCIUM 8.2* 8.0*   LFT No results for input(s): PROT, ALBUMIN, AST, ALT, ALKPHOS, BILITOT, BILIDIR, IBILI in the last 72 hours. PT/INR Recent Labs    05/30/17 1000 05/31/17 0232  LABPROT 30.1* 33.0*  INR 2.90 3.27    Studies/Results: No results found.     Assessment / Plan:   52 y/o male with LVAD in place on Coumadin, with dark stools since at least Saturday, presenting with symptomatic anemia with Hgb of 3.8. He was transfused 3 units PRBC and Hgb rose to 7.7 and BUN has downtrended slighly. He is feeling much better. 1 BM yesterday, and another this AM. His INR rose to 3.27 this AM.   He seems stable at this time. Given his rising INR and now over 3, this will make endoscopic hemostasis more difficult  if he has AVMs that need to be treated. Would prefer his INR to be in the 2s if possible prior to endoscopy, pending he is not having significant active bleeding. Will tentatively plan on enteroscopy tomorrow, however during today if he has worsening of bleeding please contact me, we can consider it sooner pending his course. We will check on him later today. I think clear liquid diet okay for now, will make NPO again after MN. Continue IV PPI. Trend Hgb.  Please call with questions.  [place] Cellar, MD [facility] Pager [phone]

## 2017-06-01 ENCOUNTER — Other Ambulatory Visit: Payer: Self-pay

## 2017-06-01 LAB — CBC
HCT: 25.2 % — ABNORMAL LOW (ref 39.0–52.0)
Hemoglobin: 7.8 g/dL — ABNORMAL LOW (ref 13.0–17.0)
MCH: 28.1 pg (ref 26.0–34.0)
MCHC: 31 g/dL (ref 30.0–36.0)
MCV: 90.6 fL (ref 78.0–100.0)
PLATELETS: 253 10*3/uL (ref 150–400)
RBC: 2.78 MIL/uL — AB (ref 4.22–5.81)
RDW: 20.5 % — ABNORMAL HIGH (ref 11.5–15.5)
WBC: 12.1 10*3/uL — AB (ref 4.0–10.5)

## 2017-06-01 LAB — BASIC METABOLIC PANEL
ANION GAP: 10 (ref 5–15)
BUN: 22 mg/dL — ABNORMAL HIGH (ref 6–20)
CO2: 23 mmol/L (ref 22–32)
Calcium: 7.6 mg/dL — ABNORMAL LOW (ref 8.9–10.3)
Chloride: 104 mmol/L (ref 101–111)
Creatinine, Ser: 2.25 mg/dL — ABNORMAL HIGH (ref 0.61–1.24)
GFR calc non Af Amer: 32 mL/min — ABNORMAL LOW (ref >60)
GFR, EST AFRICAN AMERICAN: 37 mL/min — AB (ref >60)
Glucose, Bld: 92 mg/dL (ref 65–99)
POTASSIUM: 2.9 mmol/L — AB (ref 3.5–5.1)
SODIUM: 137 mmol/L (ref 135–145)

## 2017-06-01 LAB — HEPATIC FUNCTION PANEL
ALT: 10 U/L — AB (ref 17–63)
AST: 22 U/L (ref 15–41)
Albumin: 1.7 g/dL — ABNORMAL LOW (ref 3.5–5.0)
Alkaline Phosphatase: 83 U/L (ref 38–126)
Bilirubin, Direct: 0.4 mg/dL (ref 0.1–0.5)
Indirect Bilirubin: 0.9 mg/dL (ref 0.3–0.9)
TOTAL PROTEIN: 6.2 g/dL — AB (ref 6.5–8.1)
Total Bilirubin: 1.3 mg/dL — ABNORMAL HIGH (ref 0.3–1.2)

## 2017-06-01 LAB — LACTATE DEHYDROGENASE: LDH: 190 U/L (ref 98–192)

## 2017-06-01 LAB — PROTIME-INR
INR: 3.79
PROTHROMBIN TIME: 37.1 s — AB (ref 11.4–15.2)

## 2017-06-01 MED ORDER — POTASSIUM CHLORIDE CRYS ER 20 MEQ PO TBCR
40.0000 meq | EXTENDED_RELEASE_TABLET | ORAL | Status: AC
  Administered 2017-06-01 (×2): 40 meq via ORAL
  Filled 2017-06-01 (×2): qty 2

## 2017-06-01 NOTE — Progress Notes (Signed)
LVAD Coordinator Rounding Note:  Admitted 2/5/2019due to symptomatic anemia.  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  Vital signs: Tmax: 97.4 HR:91 Doppler Pressure: 86 Automatic cuff: 93/76 (84) O2 Sat: 96 % onRA Wt:140>149>136 lbs  LVAD interrogation reveals:  Speed:5400 Flow: 4.7 Power:4.1w PI:4.1 Alarms:none Events:none Hematocrit:25 Fixed speed:5400 Low speed limit:5100  Drive line site: dressing dry and intact; anchor intact and accurately applied.Dressing change due today 06/01/17.  Due every other day using daily kit. I asked the nurse to order daily kits and silver for the pt.   Labs:   LDH trend:202>190  INRtrend:3.27>3.79   Anticoagulation Plan: -INR Goal:2-2.5 -ASA Dose: 81 mg   Blood Products: 3 units PRBC 05/30/2017  Device:Medtronic BiV -Therapies:on - interrogated in clinic 05/30/17  Plan/Recommendations: 1.Please call VAD Pager for any equipment concerns or drive line dressing questions/concerns. 2. Dressing change due today.  3. Plan for possible endoscopy when INR is in 2 range.  4. Please order daily kits and silver to have at bedside.   Tanda Rockers RN, VAD Coordinator 24/7 pager [phone]

## 2017-06-01 NOTE — Progress Notes (Signed)
Advanced Heart Failure VAD Team Note  Subjective:    Admitted with symptomatic anemia. Hgb on admit 3.8. Received 3UPRBCs. Hgb with appropriate rise from 3.8> 7.7.   Todays hgb is 7.8 . Had black BM today.   Denies SOB.  Feeling better. More energy. No CP. INR 3.79 (off coumadin for several days) LVAD INTERROGATION:  HeartMate II LVAD:  Flow 4.7 liters/min, speed 5400, power 4 PI  3.5     Objective:    Vital Signs:   Temp:  [97.4 F (36.3 C)-98.7 F (37.1 C)] 98.3 F (36.8 C) (02/07 0740) Resp:  [12-23] 12 (02/07 0740) BP: (73-94)/(59-83) 93/76 (02/07 0740) SpO2:  [92 %-96 %] 96 % (02/07 0740) Weight:  [136 lb 7.4 oz (61.9 kg)] 136 lb 7.4 oz (61.9 kg) (02/07 0509) Last BM Date: 06/01/17 Mean arterial Pressure 70-80s   Intake/Output:   Intake/Output Summary (Last 24 hours) at 06/01/2017 0958 Last data filed at 06/01/2017 0743 Gross per 24 hour  Intake 720 ml  Output 2425 ml  Net -1705 ml     Physical Exam   Physical Exam: GENERAL: NAD in bed.  HEENT: normal  Anicteric NECK: Supple, JVP 5-6   .  2+ bilaterally, no bruits.  No lymphadenopathy or thyromegaly appreciated.   CARDIAC:  Mechanical heart sounds with LVAD hum present.  LUNGS:  Clear to auscultation bilaterally.  ABDOMEN:  Soft, round, nontender, positive bowel sounds x4.     LVAD exit site: well-healed and incorporated.  Dressing dry and intact.  No erythema or drainage.  Stabilization device present and accurately applied.  Driveline dressing is being changed daily per sterile technique. EXTREMITIES:  Warm and dry, no cyanosis, clubbing, rash or edema  NEUROLOGIC:  Alert and oriented x 4.  Gait steady.  No aphasia.  No dysarthria.  Affect pleasant.       Telemetry   NSR 70-80s personally reviewed   EKG    None   Labs   Basic Metabolic Panel: Recent Labs  Lab 05/25/17 1526 05/26/17 0229 05/27/17 0218 05/30/17 1000 05/31/17 0232 06/01/17 0252  NA 136 137 137 135 135 137  K 4.4 3.7 3.5 4.6  4.3 2.9*  CL 95* 97* 100* 100* 104 104  CO2 29 29 28  21* 22 23  GLUCOSE 89 93 101* 120* 95 92  BUN 30* 29* 33* 31* 28* 22*  CREATININE 3.21* 2.91* 2.64* 2.88* 2.59* 2.25*  CALCIUM 8.4* 8.5* 8.5* 8.2* 8.0* 7.6*  MG  --  1.7 1.6* 1.7  --   --   PHOS 4.7* 4.1 3.7  --   --   --     Liver Function Tests: Recent Labs  Lab 05/25/17 1526 05/26/17 0229 05/27/17 0218 06/01/17 0252  AST  --   --   --  22  ALT  --   --   --  10*  ALKPHOS  --   --   --  83  BILITOT  --   --   --  1.3*  PROT  --   --   --  6.2*  ALBUMIN 1.7* 1.7* 1.7* 1.7*   No results for input(s): LIPASE, AMYLASE in the last 168 hours. No results for input(s): AMMONIA in the last 168 hours.  CBC: Recent Labs  Lab 05/26/17 0229 05/27/17 0218 05/30/17 1000 05/30/17 1030 05/31/17 0232 05/31/17 1230 06/01/17 0252  WBC 14.4* 12.5* 25.8*  --  16.8*  --  12.1*  HGB 7.1* 7.4* 4.0* 3.8* 7.7* 8.3* 7.8*  HCT 23.3* 23.1* 13.6* 13.0* 23.5* 25.3* 25.2*  MCV 87.3 84.6 91.3  --  89.0  --  90.6  PLT 227 184 289  --  210  --  253    INR: Recent Labs  Lab 05/26/17 0229 05/27/17 0218 05/30/17 1000 05/31/17 0232 06/01/17 0252  INR 3.42 3.73 2.90 3.27 3.79    Other results:     Imaging   No results found.   Medications:     Scheduled Medications: . atorvastatin  40 mg Oral q1800  . docusate sodium  200 mg Oral Daily  . feeding supplement  1 Container Oral BID BM  . ferrous [id number] C-folic acid  1 capsule Oral BID BM  . furosemide  80 mg Oral Daily  . pantoprazole (PROTONIX) IV  40 mg Intravenous Q12H  . potassium chloride  40 mEq Oral Q4H    Infusions: . sodium chloride      PRN Medications: acetaminophen, ondansetron (ZOFRAN) IV, oxyCODONE   Patient Profile   David [name] is a 52 year old with history of ICD (09 PCI to LAD, mRCA, '10 PCI to Lcx and '12 mRCA), HFrEF (EF ~20% for >5 years), HLD and HTN.  S/P HMIII LVAD 04/20/18   Admitted today with symptomatic anemia.      Assessment/Plan:    1. Symptomatic Anemia due to GI bleeding/melena  On 2/2 hgb was 7.1. Transfusions have been limited duet anti E antibodies.  Melena over the weekend.  Hgb 3.8 on admit. Received 3PURBCs. Hgb 7.8 today.  GI following. Plan for enteroscopy tomorrow.  Hold aspirin and coumadin. Had coumadin this morning.   INR up to 3.7. Hepatic panel was ok.  May need vitamin K for procedure. Will discuss with Dr Haroldine Laws   2. Chronic Systolic Heart Failure with HMIII LVAD implant 04/20/17  Volume status improved. Continue 80 mg po lasix daily.   No bb. No ace/arb with soft Maps and GI bleed. Off aspirin. Had coumadin today 2/5   3. H/O of AKI on CKD Stage III.  Recent admit required CVVHD-->iHD. Improved with HD catheter removed prior to discharge.  Creatinine down from 2.8>2.59>2.25  Renal function improved.   4.  PAF Maintaining NSR.  Off anticoagulation.   5. CAD s/p PCI RCA/PLOM with DES x2 and DES to mild LAD 1/18 Continue statin.   Consult cardiac rehab. Add Breeze supplements.   I reviewed the LVAD parameters from today, and compared the results to the patient's prior recorded data.  No programming changes were made.  The LVAD is functioning within specified parameters.  The patient performs LVAD self-test daily.  LVAD interrogation was negative for any significant power changes, alarms or PI events/speed drops.  LVAD equipment check completed and is in good working order.  Back-up equipment present.   LVAD education done on emergency procedures and precautions and reviewed exit site care.  Length of Stay: 2  Darrick Grinder, NP 06/01/2017, 9:58 AM  VAD Team --- VAD ISSUES ONLY--- Pager [phone] (7am - 7am)  Advanced Heart Failure Team  Pager [phone] (M-F; 7a - 4p)  Please contact [facility] for night-coverage after hours (4p -7a ) and weekends on [email or web address]  Patient seen and examined with Darrick Grinder, NP. We discussed all aspects of the encounter. I  agree with the assessment and plan as stated above.   Still with episodes of melena. Hgb drifting down slowly. Unfortunately INR drifting up so unable to scope yet. Continue to hold coumadin. Discussed dosing with PharmD  personally. LDH ok. VAD interrogated personally. Parameters stable. Renal function continues to improve. supp K.  Appreciate GI following.   Glori Bickers, MD  12:15 PM

## 2017-06-01 NOTE — Progress Notes (Signed)
      Progress Note   Subjective  Patient had one dark BM yesterday AM, and another dark BM this AM. Otherwise denies pain, Hgb stable, BUN downtrending.    Objective   Vital signs in last 24 hours: Temp:  [97.4 F (36.3 C)-98.7 F (37.1 C)] 98.3 F (36.8 C) (02/07 0740) Resp:  [12-23] 12 (02/07 0740) BP: (73-94)/(59-83) 93/76 (02/07 0740) SpO2:  [92 %-96 %] 96 % (02/07 0740) Weight:  [136 lb 7.4 oz (61.9 kg)] 136 lb 7.4 oz (61.9 kg) (02/07 0509) Last BM Date: 06/01/17 General:     AA male in NAD Heart:  LVAD in place Lungs: Respirations even and unlabored, lungs CTA bilaterally Abdomen:  Soft, nontender and nondistended.  Extremities:  Without edema. Neurologic:  Alert and oriented,  grossly normal neurologically. Psych:  Cooperative. Normal mood and affect.  Intake/Output from previous day: 02/06 0701 - 02/07 0700 In: 26 [P.O.:820] Out: 2350 [Urine:2350] Intake/Output this shift: Total I/O In: -  Out: 75 [Urine:75]  Lab Results: Recent Labs    05/30/17 1000  05/31/17 0232 05/31/17 1230 06/01/17 0252  WBC 25.8*  --  16.8*  --  12.1*  HGB 4.0*   < > 7.7* 8.3* 7.8*  HCT 13.6*   < > 23.5* 25.3* 25.2*  PLT 289  --  210  --  253   < > = values in this interval not displayed.   BMET Recent Labs    05/30/17 1000 05/31/17 0232 06/01/17 0252  NA 135 135 137  K 4.6 4.3 2.9*  CL 100* 104 104  CO2 21* 22 23  GLUCOSE 120* 95 92  BUN 31* 28* 22*  CREATININE 2.88* 2.59* 2.25*  CALCIUM 8.2* 8.0* 7.6*   LFT No results for input(s): PROT, ALBUMIN, AST, ALT, ALKPHOS, BILITOT, BILIDIR, IBILI in the last 72 hours. PT/INR Recent Labs    05/31/17 0232 06/01/17 0252  LABPROT 33.0* 37.1*  INR 3.27 3.79    Studies/Results: No results found.     Assessment / Plan:   52 y/o male with LVAD in place on Coumadin, with dark stools since at least Saturday, who initially presented with symptomatic anemia with Hgb of 3.8. He was transfused 3 units PRBC and Hgb rose to  7s and has been stable. BUN has downtrended. He has had 2 black BMs since admission. I suspect he is slowly bleeding, but HGb has remained stable post transfusion. INR too high yesterday and has risen today.   Given his rising INR, this will make endoscopic hemostasis more difficult if he has AVMs that need to be treated. Would prefer his INR to be in the 2s if possible prior to endoscopy, pending he is not having significant active bleeding. Will tentatively reschedule enteroscopy for tomorrow AM, however during today if he has worsening of bleeding please contact me. If possible to give low dose of vitamin K to lower INR slightly that would help, defer to cardiology regarding that issue. Continue IV PPI, you can advanced diet to soft for today, NPO after MN.   David Beach Cellar, MD [facility] Pager [phone]

## 2017-06-01 NOTE — Progress Notes (Addendum)
Exit Site Care:   Existing VAD dressing removed and site care performed using sterile technique. Drive line exit site cleaned with Chlora prep applicators x 2, allowed to dry, and gauze dressing with silver strip re-applied. Exit site with large scab and incorporated, the velour is fully implanted at exit site. No redness, tenderness, drainage, foul odor or rash noted.     Pt may advance to twice weekly dressing changes. Next dressing change will be due 06/05/17.  Tanda Rockers RN, BSN VAD Coordinator 24/7 Pager [phone]

## 2017-06-01 NOTE — Progress Notes (Signed)
1425 Came to see pt to see if he feels up to walking. Pt declined. Wife and visitors in room. Will continue to follow and walk as time permits. Graylon Good RN BSN 06/01/2017 2:27 PM

## 2017-06-01 NOTE — Progress Notes (Signed)
[facility]  Patient Status: Active (receiving services up to time of hospitalization)  [facility] is providing the following services: RN, PT and OT  If patient discharges after hours, please call [phone].   Janae Sauce 06/01/2017, 11:06 AM

## 2017-06-02 LAB — BASIC METABOLIC PANEL
Anion gap: 9 (ref 5–15)
BUN: 21 mg/dL — ABNORMAL HIGH (ref 6–20)
CALCIUM: 7.7 mg/dL — AB (ref 8.9–10.3)
CO2: 21 mmol/L — AB (ref 22–32)
Chloride: 105 mmol/L (ref 101–111)
Creatinine, Ser: 2.09 mg/dL — ABNORMAL HIGH (ref 0.61–1.24)
GFR calc non Af Amer: 34 mL/min — ABNORMAL LOW (ref >60)
GFR, EST AFRICAN AMERICAN: 40 mL/min — AB (ref >60)
Glucose, Bld: 90 mg/dL (ref 65–99)
Potassium: 3.6 mmol/L (ref 3.5–5.1)
Sodium: 135 mmol/L (ref 135–145)

## 2017-06-02 LAB — CBC
HCT: 28.7 % — ABNORMAL LOW (ref 39.0–52.0)
HEMATOCRIT: 26.6 % — AB (ref 39.0–52.0)
HEMOGLOBIN: 8.4 g/dL — AB (ref 13.0–17.0)
Hemoglobin: 8.9 g/dL — ABNORMAL LOW (ref 13.0–17.0)
MCH: 28.9 pg (ref 26.0–34.0)
MCH: 28.4 pg (ref 26.0–34.0)
MCHC: 31.6 g/dL (ref 30.0–36.0)
MCHC: 31 g/dL (ref 30.0–36.0)
MCV: 91.4 fL (ref 78.0–100.0)
MCV: 91.7 fL (ref 78.0–100.0)
PLATELETS: 284 10*3/uL (ref 150–400)
Platelets: 273 10*3/uL (ref 150–400)
RBC: 2.91 MIL/uL — ABNORMAL LOW (ref 4.22–5.81)
RBC: 3.13 MIL/uL — ABNORMAL LOW (ref 4.22–5.81)
RDW: 20.5 % — AB (ref 11.5–15.5)
RDW: 20 % — ABNORMAL HIGH (ref 11.5–15.5)
WBC: 8.7 10*3/uL (ref 4.0–10.5)
WBC: 8.8 10*3/uL (ref 4.0–10.5)

## 2017-06-02 LAB — PROTIME-INR
INR: 3.44
Prothrombin Time: 34.4 seconds — ABNORMAL HIGH (ref 11.4–15.2)

## 2017-06-02 LAB — LACTATE DEHYDROGENASE: LDH: 200 U/L — ABNORMAL HIGH (ref 98–192)

## 2017-06-02 LAB — MAGNESIUM: Magnesium: 1.2 mg/dL — ABNORMAL LOW (ref 1.7–2.4)

## 2017-06-02 MED ORDER — MAGNESIUM SULFATE 4 GM/100ML IV SOLN
4.0000 g | Freq: Once | INTRAVENOUS | Status: AC
  Administered 2017-06-02: 4 g via INTRAVENOUS
  Filled 2017-06-02: qty 100

## 2017-06-02 MED ORDER — POTASSIUM CHLORIDE CRYS ER 20 MEQ PO TBCR
40.0000 meq | EXTENDED_RELEASE_TABLET | Freq: Once | ORAL | Status: AC
  Administered 2017-06-02: 40 meq via ORAL
  Filled 2017-06-02: qty 2

## 2017-06-02 NOTE — Progress Notes (Signed)
LVAD Coordinator Rounding Note:  Admitted 2/5/2019due to symptomatic anemia.  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  Vital signs: Tmax: 98.3 HR:91 Doppler Pressure: 98 Automatic cuff: 110/89 (84) O2 Sat: 96 % onRA Wt:140>149>136>135 lbs  LVAD interrogation reveals:  Speed:5400 Flow: 4.4 Power:4.0w PI:5.1 Alarms:none Events:4 PI events Hematocrit:26 Fixed speed:5400 Low speed limit:5100  Drive line site: twice weekly dressing changes. Dressing dry and intact; anchor intact and accurately applied.Dressing change due 06/05/17.    Labs:   LDH trend:202>190>200  INRtrend:3.27>3.79>3.44   Anticoagulation Plan: -INR Goal:2-2.5 -ASA Dose: 81 mg   Blood Products: 3 units PRBC 05/30/2017  Device:Medtronic BiV -Therapies:on - interrogated in clinic 05/30/17  Plan/Recommendations: 1.Please call VAD Pager for any equipment concerns or drive line dressing questions/concerns. 2. VAD Coordinator will accompany pt to Endo this weekend. Please page with time of procedure. 3. Plan for possible endoscopy when INR is in 2 range.    Tanda Rockers RN, VAD Coordinator 24/7 pager [phone]

## 2017-06-02 NOTE — Progress Notes (Signed)
      Progress Note   Subjective  Patient had one dark BM yesterday AM, none since. Hgb remains stable. No pain. INR 3.4 today   Objective   Vital signs in last 24 hours: Temp:  [97.2 F (36.2 C)-98.7 F (37.1 C)] 98.3 F (36.8 C) (02/08 0811) Pulse Rate:  [59-93] 89 (02/08 0811) Resp:  [13-21] 16 (02/08 0811) BP: (74-110)/(42-89) 110/89 (02/08 0811) SpO2:  [90 %-96 %] 90 % (02/08 0811) Weight:  [135 lb 8 oz (61.5 kg)] 135 lb 8 oz (61.5 kg) (02/08 0523) Last BM Date: 06/01/17 General:    AA male in NAD Heart:  LVAD Lungs: Respirations even and unlabored, lungs CTA bilaterally Abdomen:  Soft, nontender and nondistended.  Extremities:  Without edema. Neurologic:  Alert and oriented,  grossly normal neurologically. Psych:  Cooperative. Normal mood and affect.  Intake/Output from previous day: 02/07 0701 - 02/08 0700 In: 120 [P.O.:120] Out: 2475 [Urine:2475] Intake/Output this shift: No intake/output data recorded.  Lab Results: Recent Labs    05/31/17 0232 05/31/17 1230 06/01/17 0252 06/02/17 0244  WBC 16.8*  --  12.1* 8.7  HGB 7.7* 8.3* 7.8* 8.4*  HCT 23.5* 25.3* 25.2* 26.6*  PLT 210  --  253 273   BMET Recent Labs    05/31/17 0232 06/01/17 0252 06/02/17 0244  NA 135 137 135  K 4.3 2.9* 3.6  CL 104 104 105  CO2 22 23 21*  GLUCOSE 95 92 90  BUN 28* 22* 21*  CREATININE 2.59* 2.25* 2.09*  CALCIUM 8.0* 7.6* 7.7*   LFT Recent Labs    06/01/17 0252  PROT 6.2*  ALBUMIN 1.7*  AST 22  ALT 10*  ALKPHOS 83  BILITOT 1.3*  BILIDIR 0.4  IBILI 0.9   PT/INR Recent Labs    06/01/17 0252 06/02/17 0244  LABPROT 37.1* 34.4*  INR 3.79 3.44    Studies/Results: No results found.     Assessment / Plan:   52 y/o male with LVAD in place on Coumadin, with dark stools since at least Saturday, who initially presented with symptomatic anemia with Hgb of 3.8. He was transfused 3 units PRBC and Hgb has been stable since. BUN has downtrended. He has not had any  significant bleeding since admission. Unfortunately his INR remains > 3, but is downtrending now.  Given he is not actively bleeding, would prefer to allow his INR to downtrend further prior to endoscopy if endoscopic therapy is needed during his exam. Will allow soft diet today, plan for tentatively tomorrow if INR allows. If not ready tomorrow, plan for Sunday.   Continue IV PPI, NPO after MN.   Please call with questions.  [place] Cellar, MD [facility] Pager [phone]

## 2017-06-02 NOTE — Progress Notes (Signed)
   Called by VAD coordinator/staff nurse regarding fatigue and low Map.   I personally checked MAP. MAP 72. Says he had 1 small bm today that was not bloody but black [place]. Feels ok.   CBC obtained. Hgb stable 8.9   Continue to monitor. No new orders.   Amy Clegg NP-C  5:03 PM

## 2017-06-02 NOTE — Plan of Care (Signed)
Continue plan of care.

## 2017-06-02 NOTE — Progress Notes (Signed)
Advanced Heart Failure VAD Team Note  Subjective:    Admitted with symptomatic anemia. Hgb on admit 3.8. Received 3UPRBCs. Hgb with appropriate rise from 3.8> 7.7.   Todays hgb is 8.4. Has not had coumadin this admit. INR 3.3   Denies SOB. Denies dizziness.   LVAD INTERROGATION:  HeartMate II LVAD:  Flow 4.7  liters/min, speed 5400, power 4.1 PI  4.1   Objective:    Vital Signs:   Temp:  [97.2 F (36.2 C)-98.7 F (37.1 C)] 98.3 F (36.8 C) (02/08 0811) Pulse Rate:  [59-93] 89 (02/08 0811) Resp:  [13-21] 16 (02/08 0811) BP: (74-110)/(42-89) 110/89 (02/08 0811) SpO2:  [90 %-96 %] 90 % (02/08 0811) Weight:  [135 lb 8 oz (61.5 kg)] 135 lb 8 oz (61.5 kg) (02/08 0523) Last BM Date: 06/01/17 Mean arterial Pressure 70-80s   Intake/Output:   Intake/Output Summary (Last 24 hours) at 06/02/2017 0940 Last data filed at 06/02/2017 0400 Gross per 24 hour  Intake 120 ml  Output 2400 ml  Net -2280 ml     Physical Exam    Physical Exam: GENERAL: No acute distress. HEENT: normal  NECK: Supple, JVP  5-6.  2+ bilaterally, no bruits.  No lymphadenopathy or thyromegaly appreciated.   CARDIAC:  Mechanical heart sounds with LVAD hum present.  LUNGS:  Clear to auscultation bilaterally.  ABDOMEN:  Soft, round, nontender, positive bowel sounds x4.     LVAD exit site: well-healed and incorporated.  Dressing dry and intact.  No erythema or drainage.  Stabilization device present and accurately applied.  Driveline dressing is being changed daily per sterile technique. EXTREMITIES:  Warm and dry, no cyanosis, clubbing, rash or edema  NEUROLOGIC:  Alert and oriented x 4.  Gait steady.  No aphasia.  No dysarthria.  Affect pleasant.      Telemetry  NSr 70-80s   EKG    None   Labs   Basic Metabolic Panel: Recent Labs  Lab 05/27/17 0218 05/30/17 1000 05/31/17 0232 06/01/17 0252 06/02/17 0244  NA 137 135 135 137 135  K 3.5 4.6 4.3 2.9* 3.6  CL 100* 100* 104 104 105  CO2 28 21* 22 23  21*  GLUCOSE 101* 120* 95 92 90  BUN 33* 31* 28* 22* 21*  CREATININE 2.64* 2.88* 2.59* 2.25* 2.09*  CALCIUM 8.5* 8.2* 8.0* 7.6* 7.7*  MG 1.6* 1.7  --   --  1.2*  PHOS 3.7  --   --   --   --     Liver Function Tests: Recent Labs  Lab 05/27/17 0218 06/01/17 0252  AST  --  22  ALT  --  10*  ALKPHOS  --  83  BILITOT  --  1.3*  PROT  --  6.2*  ALBUMIN 1.7* 1.7*   No results for input(s): LIPASE, AMYLASE in the last 168 hours. No results for input(s): AMMONIA in the last 168 hours.  CBC: Recent Labs  Lab 05/27/17 0218 05/30/17 1000 05/30/17 1030 05/31/17 0232 05/31/17 1230 06/01/17 0252 06/02/17 0244  WBC 12.5* 25.8*  --  16.8*  --  12.1* 8.7  HGB 7.4* 4.0* 3.8* 7.7* 8.3* 7.8* 8.4*  HCT 23.1* 13.6* 13.0* 23.5* 25.3* 25.2* 26.6*  MCV 84.6 91.3  --  89.0  --  90.6 91.4  PLT 184 289  --  210  --  253 273    INR: Recent Labs  Lab 05/27/17 0218 05/30/17 1000 05/31/17 0232 06/01/17 0252 06/02/17 0244  INR 3.73 2.90  3.27 3.79 3.44    Other results:     Imaging   No results found.   Medications:     Scheduled Medications: . atorvastatin  40 mg Oral q1800  . docusate sodium  200 mg Oral Daily  . feeding supplement  1 Container Oral BID BM  . ferrous [id number] C-folic acid  1 capsule Oral BID BM  . furosemide  80 mg Oral Daily  . pantoprazole (PROTONIX) IV  40 mg Intravenous Q12H    Infusions: . sodium chloride    . magnesium sulfate 1 - 4 g bolus IVPB      PRN Medications: acetaminophen, ondansetron (ZOFRAN) IV, oxyCODONE   Patient Profile   David [name] is a 52 year old with history of ICD (09 PCI to LAD, mRCA, '10 PCI to Lcx and '12 mRCA), HFrEF (EF ~20% for >5 years), HLD and HTN.  S/P HMIII LVAD 04/20/18   Admitted today with symptomatic anemia.     Assessment/Plan:    1. Symptomatic Anemia due to GI bleeding/melena  On 2/2 hgb was 7.1. Transfusions have been limited duet anti E antibodies.  Melena over the weekend.  Hgb  3.8 on admit. Received 3PURBCs. Todays Hgb 8.4   GI following.  INR still high. Possible enteroscopy tomorrow.  Hold aspirin and coumadin.  INR remains elevated at 3.4. Hepatic panel was ok.  May need vitamin K for procedure. Will discuss with Dr Haroldine Laws   2. Chronic Systolic Heart Failure with HMIII LVAD implant 04/20/17  Volume status improved. Continue 80 mg po lasix daily.   No bb.  Off aspirin. Had coumadin today 2/5   3. H/O of AKI on CKD Stage III.  Recent admit required CVVHD-->iHD. Improved with HD catheter removed prior to discharge.  Creatinine down from 2.8>2.59>2.25>2.1  Daily BMET   4.  PAF Maintaining NSR.  Off anticoagulation.   5. CAD s/p PCI RCA/PLOM with DES x2 and DES to mild LAD 1/18 Continue statin.    Cardiac rehab consulted.    I reviewed the LVAD parameters from today, and compared the results to the patient's prior recorded data.  No programming changes were made.  The LVAD is functioning within specified parameters.  The patient performs LVAD self-test daily.  LVAD interrogation was negative for any significant power changes, alarms or PI events/speed drops.  LVAD equipment check completed and is in good working order.  Back-up equipment present.   LVAD education done on emergency procedures and precautions and reviewed exit site care.  Length of Stay: 3  Darrick Grinder, NP 06/02/2017, 9:40 AM  VAD Team --- VAD ISSUES ONLY--- Pager [phone] (7am - 7am)  Advanced Heart Failure Team  Pager [phone] (M-F; 7a - 4p)  Please contact [facility] for night-coverage after hours (4p -7a ) and weekends on [email or web address]  Patient seen and examined with Darrick Grinder, NP. We discussed all aspects of the encounter. I agree with the assessment and plan as stated above.   Continues with melena but hgb relatively stable. INR still elevated but coming down slowly. Will continue to hold warfarin. Discussed with PharmD personally. MAPs slightly elevated. Will follow  closely. Can cover with hydralazine as needed but would be cautious with active bleeding. VAD interrogated personally. Parameters stable. Discussed with GI. Possible scope tomorrow.   Glori Bickers, MD  5:44 PM

## 2017-06-02 NOTE — Progress Notes (Signed)
Pt complaining that he feels "off" and weak today more-so this afternoon than this AM,  this afternoons doppler pressure was 70 (90 this AM)P.I. also down from this morning 5.3/5.6 to 2.8/2.9, VAD coordinator paged and HF team made aware by her, CBC ordered to recheck Hbg, advised to encourage fluids, will monitor patient.

## 2017-06-02 NOTE — Progress Notes (Signed)
[place] with pt to see if he wanted to walk. Stated he had walked in room with NT earlier and felt weak. He stated that was enough for now.  Stated will walk when he feels stronger. Graylon Good RN BSN 06/02/2017 2:48 PM

## 2017-06-02 NOTE — H&P (View-Only) (Signed)
      Progress Note   Subjective  Patient had one dark BM yesterday AM, none since. Hgb remains stable. No pain. INR 3.4 today   Objective   Vital signs in last 24 hours: Temp:  [97.2 F (36.2 C)-98.7 F (37.1 C)] 98.3 F (36.8 C) (02/08 0811) Pulse Rate:  [59-93] 89 (02/08 0811) Resp:  [13-21] 16 (02/08 0811) BP: (74-110)/(42-89) 110/89 (02/08 0811) SpO2:  [90 %-96 %] 90 % (02/08 0811) Weight:  [135 lb 8 oz (61.5 kg)] 135 lb 8 oz (61.5 kg) (02/08 0523) Last BM Date: 06/01/17 General:    AA male in NAD Heart:  LVAD Lungs: Respirations even and unlabored, lungs CTA bilaterally Abdomen:  Soft, nontender and nondistended.  Extremities:  Without edema. Neurologic:  Alert and oriented,  grossly normal neurologically. Psych:  Cooperative. Normal mood and affect.  Intake/Output from previous day: 02/07 0701 - 02/08 0700 In: 120 [P.O.:120] Out: 2475 [Urine:2475] Intake/Output this shift: No intake/output data recorded.  Lab Results: Recent Labs    05/31/17 0232 05/31/17 1230 06/01/17 0252 06/02/17 0244  WBC 16.8*  --  12.1* 8.7  HGB 7.7* 8.3* 7.8* 8.4*  HCT 23.5* 25.3* 25.2* 26.6*  PLT 210  --  253 273   BMET Recent Labs    05/31/17 0232 06/01/17 0252 06/02/17 0244  NA 135 137 135  K 4.3 2.9* 3.6  CL 104 104 105  CO2 22 23 21*  GLUCOSE 95 92 90  BUN 28* 22* 21*  CREATININE 2.59* 2.25* 2.09*  CALCIUM 8.0* 7.6* 7.7*   LFT Recent Labs    06/01/17 0252  PROT 6.2*  ALBUMIN 1.7*  AST 22  ALT 10*  ALKPHOS 83  BILITOT 1.3*  BILIDIR 0.4  IBILI 0.9   PT/INR Recent Labs    06/01/17 0252 06/02/17 0244  LABPROT 37.1* 34.4*  INR 3.79 3.44    Studies/Results: No results found.     Assessment / Plan:   52 y/o male with LVAD in place on Coumadin, with dark stools since at least Saturday, who initially presented with symptomatic anemia with Hgb of 3.8. He was transfused 3 units PRBC and Hgb has been stable since. BUN has downtrended. He has not had any  significant bleeding since admission. Unfortunately his INR remains > 3, but is downtrending now.  Given he is not actively bleeding, would prefer to allow his INR to downtrend further prior to endoscopy if endoscopic therapy is needed during his exam. Will allow soft diet today, plan for tentatively tomorrow if INR allows. If not ready tomorrow, plan for Sunday.   Continue IV PPI, NPO after MN.   Please call with questions.  Buckhall Cellar, MD [facility] Pager [phone]

## 2017-06-03 ENCOUNTER — Inpatient Hospital Stay (HOSPITAL_COMMUNITY): Payer: Medicare HMO

## 2017-06-03 ENCOUNTER — Encounter (HOSPITAL_COMMUNITY): Payer: Self-pay | Admitting: Certified Registered Nurse Anesthetist

## 2017-06-03 ENCOUNTER — Encounter (HOSPITAL_COMMUNITY)
Admission: AD | Disposition: A | Discharge status: Home Health | Payer: Self-pay | Source: Ambulatory Visit | Attending: Internal Medicine

## 2017-06-03 DIAGNOSIS — K31819 Angiodysplasia of stomach and duodenum without bleeding: Secondary | ICD-10-CM

## 2017-06-03 HISTORY — PX: ENTEROSCOPY: SHX5533

## 2017-06-03 LAB — CBC
HCT: 29.1 % — ABNORMAL LOW (ref 39.0–52.0)
Hemoglobin: 9 g/dL — ABNORMAL LOW (ref 13.0–17.0)
MCH: 28.5 pg (ref 26.0–34.0)
MCHC: 30.9 g/dL (ref 30.0–36.0)
MCV: 92.1 fL (ref 78.0–100.0)
PLATELETS: 328 10*3/uL (ref 150–400)
RBC: 3.16 MIL/uL — AB (ref 4.22–5.81)
RDW: 19.6 % — ABNORMAL HIGH (ref 11.5–15.5)
WBC: 7.2 10*3/uL (ref 4.0–10.5)

## 2017-06-03 LAB — PROTIME-INR
INR: 2.57
PROTHROMBIN TIME: 27.4 s — AB (ref 11.4–15.2)

## 2017-06-03 LAB — TYPE AND SCREEN
ABO/RH(D): O POS
ANTIBODY SCREEN: NEGATIVE
DAT, IGG: POSITIVE
DONOR AG TYPE: NEGATIVE
Donor AG Type: NEGATIVE
Donor AG Type: NEGATIVE
Donor AG Type: NEGATIVE
UNIT DIVISION: 0
UNIT DIVISION: 0
Unit division: 0
Unit division: 0

## 2017-06-03 LAB — BPAM RBC
BLOOD PRODUCT EXPIRATION DATE: 201902122359
BLOOD PRODUCT EXPIRATION DATE: 201902162359
BLOOD PRODUCT EXPIRATION DATE: 201903042359
Blood Product Expiration Date: 201903072359
ISSUE DATE / TIME: 201902051418
ISSUE DATE / TIME: 201902051710
ISSUE DATE / TIME: 201902052051
UNIT TYPE AND RH: 5100
Unit Type and Rh: 5100
Unit Type and Rh: 5100
Unit Type and Rh: 9500

## 2017-06-03 LAB — BASIC METABOLIC PANEL
ANION GAP: 11 (ref 5–15)
BUN: 16 mg/dL (ref 6–20)
CO2: 24 mmol/L (ref 22–32)
Calcium: 8 mg/dL — ABNORMAL LOW (ref 8.9–10.3)
Chloride: 103 mmol/L (ref 101–111)
Creatinine, Ser: 1.96 mg/dL — ABNORMAL HIGH (ref 0.61–1.24)
GFR calc Af Amer: 43 mL/min — ABNORMAL LOW (ref >60)
GFR, EST NON AFRICAN AMERICAN: 37 mL/min — AB (ref >60)
Glucose, Bld: 95 mg/dL (ref 65–99)
POTASSIUM: 3.9 mmol/L (ref 3.5–5.1)
Sodium: 138 mmol/L (ref 135–145)

## 2017-06-03 LAB — LACTATE DEHYDROGENASE: LDH: 195 U/L — AB (ref 98–192)

## 2017-06-03 SURGERY — ENTEROSCOPY
Anesthesia: Monitor Anesthesia Care

## 2017-06-03 MED ORDER — PHENYLEPHRINE HCL 10 MG/ML IJ SOLN
INTRAMUSCULAR | Status: DC | PRN
  Administered 2017-06-03: 120 ug via INTRAVENOUS

## 2017-06-03 MED ORDER — PROPOFOL 10 MG/ML IV BOLUS
INTRAVENOUS | Status: DC | PRN
  Administered 2017-06-03 (×3): 20 mg via INTRAVENOUS
  Administered 2017-06-03: 10 mg via INTRAVENOUS

## 2017-06-03 MED ORDER — ALBUMIN HUMAN 5 % IV SOLN
INTRAVENOUS | Status: AC
  Filled 2017-06-03: qty 250

## 2017-06-03 MED ORDER — WARFARIN - PHARMACIST DOSING INPATIENT: Freq: Every day | Status: DC

## 2017-06-03 MED ORDER — PROPOFOL 500 MG/50ML IV EMUL
INTRAVENOUS | Status: DC | PRN
  Administered 2017-06-03: 50 ug/kg/min via INTRAVENOUS

## 2017-06-03 MED ORDER — SODIUM CHLORIDE 0.9 % IV SOLN
INTRAVENOUS | Status: DC | PRN
  Administered 2017-06-03: 12:00:00 via INTRAVENOUS

## 2017-06-03 MED ORDER — PANTOPRAZOLE SODIUM 40 MG PO TBEC
40.0000 mg | DELAYED_RELEASE_TABLET | Freq: Every day | ORAL | Status: DC
  Administered 2017-06-03 – 2017-06-04 (×2): 40 mg via ORAL
  Filled 2017-06-03 (×2): qty 1

## 2017-06-03 MED ORDER — WARFARIN 0.5 MG HALF TABLET
0.5000 mg | ORAL_TABLET | Freq: Once | ORAL | Status: AC
  Administered 2017-06-03: 0.5 mg via ORAL
  Filled 2017-06-03: qty 1

## 2017-06-03 MED ORDER — BUTAMBEN-TETRACAINE-BENZOCAINE 2-2-14 % EX AERO
INHALATION_SPRAY | CUTANEOUS | Status: DC | PRN
  Administered 2017-06-03: 1 via TOPICAL

## 2017-06-03 NOTE — Transfer of Care (Signed)
Immediate Anesthesia Transfer of Care Note  Patient: David [name]  Procedure(s) Performed: ENTEROSCOPY (N/A )  Patient Location: PACU  Anesthesia Type:MAC  Level of Consciousness: awake, alert  and oriented  Airway & Oxygen Therapy: Patient Spontanous Breathing  Post-op Assessment: Report given to RN, Post -op Vital signs reviewed and stable and Patient moving all extremities X 4  Post vital signs: Reviewed and stable  Last Vitals:  Vitals:   06/03/17 1206 06/03/17 1308  BP:  100/71  Pulse: 87 83  Resp: 15 (!) 21  Temp: 37.1 C   SpO2: 100% 100%    Last Pain:  Vitals:   06/03/17 1206  TempSrc: Oral  PainSc:       Patients Stated Pain Goal: 0 (82/50/03 7048)  Complications: No apparent anesthesia complications

## 2017-06-03 NOTE — Progress Notes (Signed)
VAD Coordinator Procedure Note:   Patient underwent enteroscopy. Hemodynamics and VAD parameters monitored by me and CRNA throughout the procedure. MAPs were obtained with an automatic BP cuff on the right arm.       Auto cuff(MAP):  Flow: PI: Power:     Speed:      Time:          Pre-procedure:113/101 (107)      4.3  5.7   4.0       5400  1232   Sedation Induction:97/87(92)     5.2  3.5   4.0       5400  1242     101/81(89)     5.2    2.3   4.0       5450  1245     102/74(89)     4.5  4.6   4.0       5400  1300     Interventions: pt had 4 AVMs that were cauterized using APC. Pt required a small amount of neo during the case.  Recovery area: 100/71(79)     4.6    4.2    4.0        5400  1309   Patient tolerated the procedure well. PIs were 2.3-5.7 throughout the case with no power elevations. After adequate sedation was achieved, pulse ox 97% and maintained >92% throughout the remainder of the procedure. MAPs were 89-107.   Patient Disposition: Anda Kraft, RN from [facility] came to the bedside in Endo to accompany pt back to [facility]. All VAD parameters are stable, pt is alert and oriented, v/s stable.  Tanda Rockers RN, BSN VAD Coordinator 24/7 Pager [phone]

## 2017-06-03 NOTE — Progress Notes (Addendum)
ANTICOAGULATION CONSULT NOTE - Initial Consult  Pharmacy Consult for warfarin Indication: LVAD  Allergies  Allergen Reactions  . Plavix [Clopidogrel Bisulfate] Hives    Patient Measurements: Height: 5\' 5"  (165.1 cm) Weight: 132 lb 8 oz (60.1 kg) IBW/kg (Calculated) : 61.5   Vital Signs: Temp: 98.4 F (36.9 C) (02/09 0727) Temp Source: Oral (02/09 0727) BP: 83/69 (02/09 0727) Pulse Rate: 85 (02/09 0727)  Labs: Recent Labs    06/01/17 0252 06/02/17 0244 06/02/17 1640 06/03/17 0233  HGB 7.8* 8.4* 8.9* 9.0*  HCT 25.2* 26.6* 28.7* 29.1*  PLT 253 273 284 328  LABPROT 37.1* 34.4*  --  27.4*  INR 3.79 3.44  --  2.57  CREATININE 2.25* 2.09*  --  1.96*    Estimated Creatinine Clearance: 37.1 mL/min (A) (by C-G formula based on SCr of 1.96 mg/dL (H)).   Medical History: Past Medical History:  Diagnosis Date  . AICD (automatic cardioverter/defibrillator) present   . Anemia   . Anxiety   . CAD (coronary artery disease) 2009   AMI in 12/2007 with PCI to LAD, staged PCI to  mid/distal RCA, NSTEMI in 02/2009 with BMS to LCx  . CHF (congestive heart failure) ([place])   . Chronic kidney disease 11/03/2016   stage 3 kidney disease  . Dysrhythmia    "arrythmia problems at some point", "fatal rhythms"  . History of blood transfusion    "I was bleeding from was rectum" (04/19/2017)  . HLD (hyperlipidemia)   . HTN (hypertension)   . Ischemic cardiomyopathy    Admitted in 07/2010 with CHF exacerbation  . Myocardial infarction ([place])    "I've had 4" (04/19/2017)  . Pneumonia 2018 X 1  . Seizures ([place])    one seizure in 04/2016 during cardiac event (04/19/2017)     Assessment: David [name] with HF s/p LVAD HM3 implant 12/18.  Admitted with low Hgb, melena and symptomatic anemia.  Hgb improved 3.8>9 with PRBC.  Plan endoscopy this weekend.  INR 3.8> 2.6.    Goal of Therapy:  INR 2-3 - may decrease after GIB - discuss with team Monitor platelets by anticoagulation protocol: Yes    Plan:  Holding warfarin for now F/U  Post endoscopy for [facility] plan  Bonnita Nasuti Pharm.D. CPP, BCPS Clinical Pharmacist [phone] 06/03/2017 7:33 AM    PM Addendum S/p endoscopy > APC to 4 non-bleeding AVMs.  Keep INR at lowest possible goal per GI.  INR fell 3.4>2.6 overnight will give warfarin 0.5mg  x1 and f/u in am.  Bonnita Nasuti Pharm.D. CPP, BCPS Clinical Pharmacist [phone] 06/03/2017 3:07 PM

## 2017-06-03 NOTE — Op Note (Signed)
[facility] Patient Name: David [name] Procedure Date : 06/03/2017 MRN: [id number] Attending MD: Carlota Raspberry. Willard Madrigal MD, MD Date of Birth: 12-18-1965 CSN: [id number] Age: 52 Admit Type: Inpatient Procedure:                Small bowel enteroscopy Indications:              Melena, history of Coumadin use, history of LVAD Providers:                Carlota Raspberry. Sherryll Skoczylas MD, MD, Zenon Mayo, RN,                            Laurena Spies, Technician, Cletis Athens,                            Technician Referring MD:              Medicines:                Monitored Anesthesia Care Complications:            No immediate complications. Estimated blood loss:                            Minimal. Estimated Blood Loss:     Estimated blood loss was minimal. Procedure:                Pre-Anesthesia Assessment:                           - Prior to the procedure, a History and Physical                            was performed, and patient medications and                            allergies were reviewed. The patient's tolerance of                            previous anesthesia was also reviewed. The risks                            and benefits of the procedure and the sedation                            options and risks were discussed with the patient.                            All questions were answered, and informed consent                            was obtained. Prior Anticoagulants: The patient has                            taken Coumadin (warfarin), last dose was 4 days  prior to procedure. ASA Grade Assessment: III - A                            patient with severe systemic disease. After                            reviewing the risks and benefits, the patient was                            deemed in satisfactory condition to undergo the                            procedure.                           After obtaining informed consent, the endoscope  was                            passed under direct vision. Throughout the                            procedure, the patient's blood pressure, pulse, and                            oxygen saturations were monitored continuously. The                            EC-[id number] ([id number]) scope was introduced through                            the mouth and advanced to the proximal jejunum. The                            small bowel enteroscopy was accomplished without                            difficulty. The patient tolerated the procedure                            well. Scope In: Scope Out: Findings:      The examined esophagus was normal.      Four 3 to 4 mm non bleeding angiodysplastic lesions were found in the       gastric fundus and in the gastric body. Fulguration to ablate the lesion       to prevent bleeding by argon plasma was successful.      The exam of the stomach was otherwise normal.      There was no evidence of significant pathology in the entire examined       duodenum.      There was no evidence of significant pathology in the proximal jejunum. Impression:               - Normal esophagus.                           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 Treated with argon plasma coagulation                            (APC).                           - Normal examined duodenum.                           - The examined portion of the jejunum was normal.                           - No specimens collected.                           Overall, given symptoms of melena in association                            with BUN elevation, I suspect AVMs are the likely                            cause of bleeding in the setting of anticoagulation. Recommendation:           - Return patient to hospital ward for ongoing care.                           - Soft diet today, advance as tolerated                           - Continue present  medications.                           - Keep INR at lowest threshold of acceptable range                           - Continue protonix twice daily to help healing                            post APC                           - Patient does need a colonoscopy at some point in                            time as he has never had one, but can be done as                            outpatient, think colonic source of bleeding is                            much less likely at this time Procedure Code(s):        --- Professional ---  [phone], Small intestinal endoscopy, enteroscopy                            beyond second portion of duodenum, not including                            ileum; with control of bleeding (eg, injection,                            bipolar cautery, unipolar cautery, laser, heater                            probe, stapler, plasma coagulator) Diagnosis Code(s):        --- Professional ---                           K31.819, Angiodysplasia of stomach and duodenum                            without bleeding                           K92.1, Melena (includes Hematochezia) CPT copyright 2016 American Medical Association. All rights reserved. The codes documented in this report are preliminary and upon coder review may  be revised to meet current compliance requirements. Remo Lipps P. Danesha Kirchoff MD, MD 06/03/2017 1:09:23 PM This report has been signed electronically. Number of Addenda: 0

## 2017-06-03 NOTE — Progress Notes (Signed)
LVAD Coordinator Rounding Note:  Admitted 2/5/2019due to symptomatic anemia.  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  Vital signs: Tmax: 98.3 HR:85 Doppler Pressure: 86 Automatic cuff: 91/80 (86) O2 Sat: 96 % onRA Wt:140>149>136>135>132 lbs  LVAD interrogation reveals:  Speed:5400 Flow: 4.3 Power:4.1w PI:6.7 Alarms:none Events:none Hematocrit:29 Fixed speed:5400 Low speed limit:5100  Drive line site: twice weekly dressing changes. Dressing dry and intact; anchor intact and accurately applied.Dressing change due 06/05/17.    Labs:   LDH trend:202>190>200>195  INRtrend:3.27>3.79>3.44>2.57   Anticoagulation Plan: -INR Goal:2-2.5 -ASA Dose: 81 mg   Blood Products: 3 units PRBC 05/30/2017  Device:Medtronic BiV -Therapies:on - interrogated in clinic 05/30/17  Plan/Recommendations: 1.VAD Coordinator will accompany pt to Endo. 2. Please page VAD coordinator for any equipment questions or concerns.   Tanda Rockers RN, VAD Coordinator 24/7 pager [phone]

## 2017-06-03 NOTE — Progress Notes (Signed)
Patient ID: David [name], male   DOB: 1965-11-18, 52 y.o.   MRN: [id number]   Advanced Heart Failure VAD Team Note  PCP-Cardiologist: No primary care provider on file.   Subjective:    No complaints today.  Hgb up to 9.0.  Creatinine 1.96.  1 BM last night, says it was dark brown.   LVAD INTERROGATION:  HeartMate II LVAD:  Flow 4.6 liters/min, speed 5400, power 4.1, PI 5.4.    Objective:    Vital Signs:   Temp:  [97.8 F (36.6 C)-98.4 F (36.9 C)] 98.4 F (36.9 C) (02/09 0727) Pulse Rate:  [73-93] 85 (02/09 0727) Resp:  [15-20] 17 (02/09 0727) BP: (80-103)/(57-90) 83/69 (02/09 0750) SpO2:  [92 %-98 %] 96 % (02/09 0727) Weight:  [132 lb 8 oz (60.1 kg)] 132 lb 8 oz (60.1 kg) (02/09 0533) Last BM Date: 06/02/17 Mean arterial Pressure 70s-80s  Intake/Output:   Intake/Output Summary (Last 24 hours) at 06/03/2017 1051 Last data filed at 06/03/2017 0755 Gross per 24 hour  Intake -  Output 2425 ml  Net -2425 ml     Physical Exam    General:  Well appearing. No resp difficulty HEENT: normal Neck: supple. JVP not elevated. Carotids 2+ bilat; no bruits. No lymphadenopathy or thyromegaly appreciated. Cor: Mechanical heart sounds with LVAD hum present. Lungs: clear Abdomen: soft, nontender, nondistended. No hepatosplenomegaly. No bruits or masses. Good bowel sounds. Driveline: C/D/I; securement device intact and driveline incorporated Extremities: no cyanosis, clubbing, rash, edema Neuro: alert & orientedx3, cranial nerves grossly intact. moves all 4 extremities w/o difficulty. Affect pleasant   Telemetry   NSR, personally reviewed  Labs   Basic Metabolic Panel: Recent Labs  Lab 05/30/17 1000 05/31/17 0232 06/01/17 0252 06/02/17 0244 06/03/17 0233  NA 135 135 137 135 138  K 4.6 4.3 2.9* 3.6 3.9  CL 100* 104 104 105 103  CO2 21* 22 23 21* 24  GLUCOSE 120* 95 92 90 95  BUN 31* 28* 22* 21* 16  CREATININE 2.88* 2.59* 2.25* 2.09* 1.96*  CALCIUM 8.2* 8.0* 7.6* 7.7*  8.0*  MG 1.7  --   --  1.2*  --     Liver Function Tests: Recent Labs  Lab 06/01/17 0252  AST 22  ALT 10*  ALKPHOS 83  BILITOT 1.3*  PROT 6.2*  ALBUMIN 1.7*   No results for input(s): LIPASE, AMYLASE in the last 168 hours. No results for input(s): AMMONIA in the last 168 hours.  CBC: Recent Labs  Lab 05/31/17 0232 05/31/17 1230 06/01/17 0252 06/02/17 0244 06/02/17 1640 06/03/17 0233  WBC 16.8*  --  12.1* 8.7 8.8 7.2  HGB 7.7* 8.3* 7.8* 8.4* 8.9* 9.0*  HCT 23.5* 25.3* 25.2* 26.6* 28.7* 29.1*  MCV 89.0  --  90.6 91.4 91.7 92.1  PLT 210  --  253 273 284 328    INR: Recent Labs  Lab 05/30/17 1000 05/31/17 0232 06/01/17 0252 06/02/17 0244 06/03/17 0233  INR 2.90 3.27 3.79 3.44 2.57    Other results:  EKG:    Imaging    No results found.   Medications:     Scheduled Medications: . atorvastatin  40 mg Oral q1800  . docusate sodium  200 mg Oral Daily  . feeding supplement  1 Container Oral BID BM  . ferrous [id number] C-folic acid  1 capsule Oral BID BM  . furosemide  80 mg Oral Daily  . pantoprazole (PROTONIX) IV  40 mg Intravenous Q12H     Infusions: .  sodium chloride       PRN Medications:  acetaminophen, ondansetron (ZOFRAN) IV, oxyCODONE    Assessment/Plan:    1. Symptomatic Anemiadue to GI bleeding/melena: On 2/2 hgb was 7.1. 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  Melena over the weekend. Hgb 3.8 on admit. Received 3 units PRBCs. Today's hgb up to 9.  No further overt GI bleeding. INR down to 2.57 today.   - EGD/enteroscopy today.  - Stop aspirin.  - Depending on findings today, will restart warfarin for goal INR 2-2.5 (with no ASA).   2. Chronic systolic CHF with HM III LVAD implant 04/20/17: Volume status ok on exam, MAP at goal.  LVAD parameters stable.  - Continue 80 mg po lasix daily.   3. H/O of AKI on CKD Stage III: Recent admit required CVVHD-->iHD. Improved with HD catheter removed prior to  discharge.  Creatinine down from 2.8>2.59>2.25>2.1>1.96.   - Daily BMET  4. PAF: Maintaining NSR.  5. CAD:  s/p PCI RCA/PLOM with DES x2 and DES to mild LAD 1/18 - Continue statin.   I reviewed the LVAD parameters from today, and compared the results to the patient's prior recorded data.  No programming changes were made.  The LVAD is functioning within specified parameters.  The patient performs LVAD self-test daily.  LVAD interrogation was negative for any significant power changes, alarms or PI events/speed drops.  LVAD equipment check completed and is in good working order.  Back-up equipment present.   LVAD education done on emergency procedures and precautions and reviewed exit site care.  Length of Stay: 4  Loralie Champagne, MD 06/03/2017, 10:51 AM  VAD Team --- VAD ISSUES ONLY--- Pager [phone] (7am - 7am)  Advanced Heart Failure Team  Pager [phone] (M-F; 7a - 4p)  Please contact [facility] for night-coverage after hours (4p -7a ) and weekends on [email or web address]

## 2017-06-03 NOTE — Anesthesia Preprocedure Evaluation (Signed)
Anesthesia Evaluation  Patient identified by MRN, date of birth, ID band Patient awake    Reviewed: Allergy & Precautions, NPO status , Patient's Chart, lab work & pertinent test results  Airway Mallampati: II  TM Distance: >3 FB Neck ROM: Full    Dental  (+) Edentulous Upper, Edentulous Lower   Pulmonary former smoker,    breath sounds clear to auscultation       Cardiovascular hypertension,  Rhythm:Regular Rate:Normal  Mechanical hum from LVAD clearly audible   Neuro/Psych    GI/Hepatic   Endo/Other    Renal/GU      Musculoskeletal   Abdominal   Peds  Hematology   Anesthesia Other Findings   Reproductive/Obstetrics                             Anesthesia Physical Anesthesia Plan  ASA: III  Anesthesia Plan: MAC   Post-op Pain Management:    Induction:   PONV Risk Score and Plan: Ondansetron and Dexamethasone  Airway Management Planned: Simple Face Mask and Nasal Cannula  Additional Equipment:   Intra-op Plan:   Post-operative Plan:   Informed Consent: I have reviewed the patients History and Physical, chart, labs and discussed the procedure including the risks, benefits and alternatives for the proposed anesthesia with the patient or authorized representative who has indicated his/her understanding and acceptance.     Plan Discussed with: CRNA and Anesthesiologist  Anesthesia Plan Comments:         Anesthesia Quick Evaluation

## 2017-06-03 NOTE — Interval H&P Note (Signed)
History and Physical Interval Note:  06/03/2017 12:17 PM  David [name]  has presented today for surgery, with the diagnosis of upper GI bleed  The various methods of treatment have been discussed with the patient and family. After consideration of risks, benefits and other options for treatment, the patient has consented to  Procedure(s): ENTEROSCOPY (N/A) as a surgical intervention .  The patient's history has been reviewed, patient examined, no change in status, stable for surgery.  I have reviewed the patient's chart and labs.  Questions were answered to the patient's satisfaction.     [place]

## 2017-06-03 NOTE — Anesthesia Postprocedure Evaluation (Signed)
Anesthesia Post Note  Patient: David [name]  Procedure(s) Performed: ENTEROSCOPY (N/A )     Patient location during evaluation: Endoscopy Anesthesia Type: MAC Level of consciousness: awake and alert Pain management: pain level controlled Vital Signs Assessment: post-procedure vital signs reviewed and stable Respiratory status: spontaneous breathing, nonlabored ventilation, respiratory function stable and patient connected to nasal cannula oxygen Cardiovascular status: stable and blood pressure returned to baseline Postop Assessment: no apparent nausea or vomiting Anesthetic complications: no    Last Vitals:  Vitals:   06/03/17 1614 06/03/17 1616  BP: (!) 80/71   Pulse: (!) 117 91  Resp: 17 16  Temp: 36.7 C   SpO2: 96% 95%    Last Pain:  Vitals:   06/03/17 1614  TempSrc: Oral  PainSc:                  [name]

## 2017-06-04 ENCOUNTER — Other Ambulatory Visit: Payer: Self-pay | Admitting: Physician Assistant

## 2017-06-04 DIAGNOSIS — Q2733 Arteriovenous malformation of digestive system vessel: Secondary | ICD-10-CM

## 2017-06-04 LAB — BASIC METABOLIC PANEL
Anion gap: 11 (ref 5–15)
BUN: 12 mg/dL (ref 6–20)
CHLORIDE: 103 mmol/L (ref 101–111)
CO2: 23 mmol/L (ref 22–32)
CREATININE: 1.7 mg/dL — AB (ref 0.61–1.24)
Calcium: 7.8 mg/dL — ABNORMAL LOW (ref 8.9–10.3)
GFR calc Af Amer: 51 mL/min — ABNORMAL LOW (ref >60)
GFR calc non Af Amer: 44 mL/min — ABNORMAL LOW (ref >60)
GLUCOSE: 88 mg/dL (ref 65–99)
POTASSIUM: 2.9 mmol/L — AB (ref 3.5–5.1)
Sodium: 137 mmol/L (ref 135–145)

## 2017-06-04 LAB — CBC
HCT: 27.9 % — ABNORMAL LOW (ref 39.0–52.0)
HEMOGLOBIN: 8.7 g/dL — AB (ref 13.0–17.0)
MCH: 28.2 pg (ref 26.0–34.0)
MCHC: 31.2 g/dL (ref 30.0–36.0)
MCV: 90.6 fL (ref 78.0–100.0)
PLATELETS: 319 10*3/uL (ref 150–400)
RBC: 3.08 MIL/uL — AB (ref 4.22–5.81)
RDW: 18.2 % — ABNORMAL HIGH (ref 11.5–15.5)
WBC: 7 10*3/uL (ref 4.0–10.5)

## 2017-06-04 LAB — LACTATE DEHYDROGENASE: LDH: 178 U/L (ref 98–192)

## 2017-06-04 LAB — PROTIME-INR
INR: 2.17
Prothrombin Time: 24 seconds — ABNORMAL HIGH (ref 11.4–15.2)

## 2017-06-04 LAB — MAGNESIUM: Magnesium: 1.4 mg/dL — ABNORMAL LOW (ref 1.7–2.4)

## 2017-06-04 MED ORDER — WARFARIN SODIUM 2 MG PO TABS: ORAL_TABLET | ORAL | 3 refills | Status: DC

## 2017-06-04 MED ORDER — OXYCODONE HCL 10 MG PO TABS: 10.0000 mg | ORAL_TABLET | Freq: Four times a day (QID) | ORAL | 0 refills | Status: DC | PRN

## 2017-06-04 MED ORDER — POTASSIUM CHLORIDE CRYS ER 20 MEQ PO TBCR: 20.0000 meq | EXTENDED_RELEASE_TABLET | Freq: Every day | ORAL | Status: DC

## 2017-06-04 MED ORDER — POTASSIUM CHLORIDE CRYS ER 20 MEQ PO TBCR: 20.0000 meq | EXTENDED_RELEASE_TABLET | Freq: Every day | ORAL | 2 refills | Status: DC

## 2017-06-04 MED ORDER — MAGNESIUM SULFATE 2 GM/50ML IV SOLN
2.0000 g | Freq: Once | INTRAVENOUS | Status: AC
  Administered 2017-06-04: 2 g via INTRAVENOUS
  Filled 2017-06-04: qty 50

## 2017-06-04 MED ORDER — FUROSEMIDE 40 MG PO TABS: 40.0000 mg | ORAL_TABLET | Freq: Every day | ORAL | Status: DC

## 2017-06-04 MED ORDER — FUROSEMIDE 40 MG PO TABS: 40.0000 mg | ORAL_TABLET | Freq: Every day | ORAL | 2 refills | Status: DC

## 2017-06-04 MED ORDER — POTASSIUM CHLORIDE CRYS ER 20 MEQ PO TBCR
40.0000 meq | EXTENDED_RELEASE_TABLET | Freq: Once | ORAL | Status: AC
  Administered 2017-06-04: 40 meq via ORAL
  Filled 2017-06-04: qty 2

## 2017-06-04 NOTE — Discharge Instructions (Signed)
Information on my medicine - Coumadin®   (Warfarin) ° °This medication education was reviewed with me or my healthcare representative as part of my discharge preparation.   °Why was Coumadin prescribed for you? °Coumadin was prescribed for you because you have a blood clot or a medical condition that can cause an increased risk of forming blood clots. Blood clots can cause serious health problems by blocking the flow of blood to the heart, lung, or brain. Coumadin can prevent harmful blood clots from forming. °As a reminder your indication for Coumadin is:   Blood Clot Prevention After Heart Pump Surgery ° °What test will check on my response to Coumadin? °While on Coumadin (warfarin) you will need to have an INR test regularly to ensure that your dose is keeping you in the desired range. The INR (international normalized ratio) number is calculated from the result of the laboratory test called prothrombin time (PT). ° °If an INR APPOINTMENT HAS NOT ALREADY BEEN MADE FOR YOU please schedule an appointment to have this lab work done by your health care provider within 7 days. °Your INR goal is a number between:  2 to 2.5. ° °What  do you need to  know  About  COUMADIN? °Take Coumadin (warfarin) exactly as prescribed by your healthcare provider about the same time each day.  DO NOT stop taking without talking to the doctor who prescribed the medication.  Stopping without other blood clot prevention medication to take the place of Coumadin may increase your risk of developing a new clot or stroke.  Get refills before you run out. ° °What do you do if you miss a dose? °If you miss a dose, take it as soon as you remember on the same day then continue your regularly scheduled regimen the next day.  Do not take two doses of Coumadin at the same time. ° °Important Safety Information °A possible side effect of Coumadin (Warfarin) is an increased risk of bleeding. You should call your healthcare provider right away if you  experience any of the following: °? Bleeding from an injury or your nose that does not stop. °? Unusual colored urine (red or dark brown) or unusual colored stools (red or black). °? Unusual bruising for unknown reasons. °? A serious fall or if you hit your head (even if there is no bleeding). ° °Some foods or medicines interact with Coumadin® (warfarin) and might alter your response to warfarin. To help avoid this: °? Eat a balanced diet, maintaining a consistent amount of Vitamin K. °? Notify your provider about major diet changes you plan to make. °? Avoid alcohol or limit your intake to 1 drink for women and 2 drinks for men per day. °(1 drink is 5 oz. wine, 12 oz. beer, or 1.5 oz. liquor.) ° °Make sure that ANY health care provider who prescribes medication for you knows that you are taking Coumadin (warfarin).  Also make sure the healthcare provider who is monitoring your Coumadin knows when you have started a new medication including herbals and non-prescription products. ° °Coumadin® (Warfarin)  Major Drug Interactions  °Increased Warfarin Effect Decreased Warfarin Effect  °Alcohol (large quantities) °Antibiotics (esp. Septra/Bactrim, Flagyl, Cipro) °Amiodarone (Cordarone) °Aspirin (ASA) °Cimetidine (Tagamet) °Megestrol (Megace) °NSAIDs (ibuprofen, naproxen, etc.) °Piroxicam (Feldene) °Propafenone (Rythmol SR) °Propranolol (Inderal) °Isoniazid (INH) °Posaconazole (Noxafil) Barbiturates (Phenobarbital) °Carbamazepine (Tegretol) °Chlordiazepoxide (Librium) °Cholestyramine (Questran) °Griseofulvin °Oral Contraceptives °Rifampin °Sucralfate (Carafate) °Vitamin K  ° °Coumadin® (Warfarin) Major Herbal Interactions  °Increased Warfarin Effect Decreased Warfarin Effect  °  Garlic °Ginseng °Ginkgo biloba Coenzyme Q10 °Green tea °St. John’s wort   ° °Coumadin® (Warfarin) FOOD Interactions  °Eat a consistent number of servings per week of foods HIGH in Vitamin K °(1 serving = ½ cup)  °Collards (cooked, or boiled &  drained) °Kale (cooked, or boiled & drained) °Mustard greens (cooked, or boiled & drained) °Parsley *serving size only = ¼ cup °Spinach (cooked, or boiled & drained) °Swiss chard (cooked, or boiled & drained) °Turnip greens (cooked, or boiled & drained)  °Eat a consistent number of servings per week of foods MEDIUM-HIGH in Vitamin K °(1 serving = 1 cup)  °Asparagus (cooked, or boiled & drained) °Broccoli (cooked, boiled & drained, or raw & chopped) °Brussel sprouts (cooked, or boiled & drained) *serving size only = ½ cup °Lettuce, raw (green leaf, endive, romaine) °Spinach, raw °Turnip greens, raw & chopped  ° °These websites have more information on Coumadin (warfarin):  www.coumadin.com; °www.ahrq.gov/consumer/coumadin.htm; ° ° ° °

## 2017-06-04 NOTE — Discharge Summary (Signed)
Discharge Summary    Patient ID: David [name],  MRN: [id number], DOB/AGE: 1966-03-20 52 y.o.  Admit date: 05/30/2017 Discharge date: 06/04/2017  Primary Care Provider: Raina [name]. Primary Cardiologist: David Bickers, MD  Discharge Diagnoses    Principal Problem:   Acute upper GI bleed Active Problems:   Chronic systolic heart failure (HCC)   Presence of left ventricular assist device (LVAD) (HCC)   Symptomatic anemia   Gastric AVM   Allergies Allergies  Allergen Reactions  . Plavix [Clopidogrel Bisulfate] Hives    Diagnostic Studies/Procedures    Upper EGD 06/03/2017 - Normal esophagus. 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 Treated with argon plasma coagulation (APC). - Normal examined duodenum. 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   Admitted  12/26 through 05/27/2017 for scheduled LVAD implant. On 12/27 he underwent HMIII LVAD implant. Hospital course complicated by septic shock with achromobacter tracheal aspirate, shock liver, and AKI. Required short term dialysis. Due to improved renal function HD catheter was removed. Discharge hemoglobin was 7.1. He was adamant he was going home on 2/2 and did not want to stay the weekend to further observe hemoglobin.   He returned for post hospital follow up on 05/30/2017. On the day of discharge he was instructed to hold coumadin until 2/3. Today he took coumadin before he arrived for his appointment. Over the weekend he has had N/V and black stools. Complaining of dizziness and fatigue. Denies SOB. Poor appetite.   LVAD INTERROGATION:  HeartMate II LVAD:  Flow 4.8  liters/min, speed 5400, power 4 , PI 2.5       Hospital Course     Consultants: GI service Dr. Havery [name]  Patient was admitted for symptomatic anemia due  to upper GI bleed and malena.  Initial hemoglobin on 05/30/2017 was 4.0.  Creatinine was 2.88.  INR 2.9.  patient was directly admitted to cardiology service.  He was started on IV fluid due to low volume.  He was also transfused with 4 units of packed red blood cell.  LVAD parameters were all within normal limit. Patient was seen by GI service on 06/03/2017 which showed normal esophagus, 4 nonbleeding angiodysplastic lesion in the stomach treated with argon plasma coagulation, normal duodenum, normal examined portion of jejunum.  It was suspected AVMs were the likely cause of bleeding in the setting of anticoagulation.  He will need colonoscopy at some point, this can be done as outpatient.  Suspicion for lower GI bleed is relatively low.  It was also recommended to keep his INR at the lowest therapeutic threshold.  Patient was seen by Dr. Aundra [name] in the morning of 06/04/2017, his hemoglobin seems to be quite stable.  He is deemed stable for discharge from cardiology perspective on following medication.  He will need close outpatient follow-up with LVAD clinic this Thursday.  He does need a CBC and INR on Tuesday prior to the visit (I have given him a paper Rx for these labs).  Lasix has been decreased to 40 mg daily with 20 mEq of potassium supplement.  During the meantime he will also stay off of aspirin.  Meds for discharge: warfarin goal INR 2-2.5, Lasix 40 mg daily, KCl 20 daily, Protonix 40 daily, atorvastatin 40 daily.  I have given him limited quantity of oxycodone (his home pain med after LVAD) for 4 days (16 tablets total), if he needs more, he will need to discuss with his PCP  _____________  Discharge Vitals Blood pressure 95/74, pulse 91, temperature 98.3 F (36.8 C), temperature source Oral, resp. rate 17, height 5\' 5"  (1.651 m), weight 132 lb 4.8 oz (60 kg), SpO2 98 %.  Filed Weights   06/03/17 0533 06/03/17 1206 06/04/17 0525  Weight: 132 lb 8 oz (60.1 kg) 132 lb 8 oz (60.1 kg) 132 lb 4.8  oz (60 kg)    Labs & Radiologic Studies    CBC Recent Labs    06/03/17 0233 06/04/17 0248  WBC 7.2 7.0  HGB 9.0* 8.7*  HCT 29.1* 27.9*  MCV 92.1 90.6  PLT 328 008   Basic Metabolic Panel Recent Labs    06/02/17 0244 06/03/17 0233 06/04/17 0248  NA 135 138 137  K 3.6 3.9 2.9*  CL 105 103 103  CO2 21* 24 23  GLUCOSE 90 95 88  BUN 21* 16 12  CREATININE 2.09* 1.96* 1.70*  CALCIUM 7.7* 8.0* 7.8*  MG 1.2*  --  1.4*  _____________  Dg Chest 2 View  Result Date: 05/15/2017 CLINICAL DATA:  Elevated white blood cell count EXAM: CHEST  2 VIEW COMPARISON:  05/07/2017 FINDINGS: LVAD, left pacer remain in place, unchanged. Placement of new internal jugular dialysis catheter on the right. The tips are in the right atrium. No pneumothorax. Cardiomegaly with vascular congestion. Airspace opacity noted posteriorly at the bases on the lateral view could reflect atelectasis or infiltrate. IMPRESSION: Cardiomegaly, vascular congestion. Posterior airspace opacity at the bases on the lateral view could reflect atelectasis or infiltrate, suspect in the left lung base. Electronically Signed   By: Rolm Baptise M.D.   On: 05/15/2017 12:26   Ir Fluoro Guide Cv Line Right  Result Date: 05/08/2017 CLINICAL DATA:  Heart failure with LVAD. Renal insufficiency. Indwelling right subclavian temporary hemodialysis catheter. Needs long-term access for hemodialysis. EXAM: TUNNELED HEMODIALYSIS CATHETER PLACEMENT WITH ULTRASOUND AND FLUOROSCOPIC GUIDANCE TECHNIQUE: The procedure, risks, benefits, and alternatives were explained to the patient. Questions regarding the procedure were encouraged and answered. The patient understands and consents to the procedure. As antibiotic prophylaxis, cefazolin 2 g was ordered pre-procedure and administered intravenously within one hour of incision.Patency of the right IJ vein was confirmed with ultrasound with image documentation. An appropriate skin site was determined. Region  was prepped using maximum barrier technique including cap and mask, sterile gown, sterile gloves, large sterile sheet, and Chlorhexidine as cutaneous antisepsis. The region was infiltrated locally with 1% lidocaine. Intravenous Fentanyl and Versed were administered as conscious sedation during continuous monitoring of the patient's level of consciousness and physiological / cardiorespiratory status by the radiology RN, with a total moderate sedation time of 10 minutes. Under real-time ultrasound guidance, the right IJ vein was accessed with a 21 gauge micropuncture needle; the needle tip within the vein was confirmed with ultrasound image documentation. Needle exchanged over the 018 guidewire for transitional dilator, which allowed advancement of a Benson wire into the IVC. Over this, an MPA catheter was advanced. A Palindrome 23 hemodialysis catheter was tunneled from the right anterior chest wall approach to the right IJ dermatotomy site. The MPA catheter was exchanged over an Amplatz wire for serial vascular dilators which allow placement of a peel-away sheath, through which the catheter was advanced under intermittent fluoroscopy, positioned with its tips in the proximal and  midright atrium. Spot chest radiograph confirms good catheter position. No pneumothorax. Catheter was flushed and primed per protocol. Catheter secured externally with O Prolene sutures. The right IJ dermatotomy site was closed with Dermabond. COMPLICATIONS: COMPLICATIONS None immediate FLUOROSCOPY TIME:  30 seconds; 4 mGy COMPARISON:  None IMPRESSION: 1. Technically successful placement of tunneled right IJ hemodialysis catheter with ultrasound and fluoroscopic guidance. Ready for routine use. ACCESS: Remains approachable for percutaneous intervention as needed. Electronically Signed   By: Lucrezia Europe M.D.   On: 05/08/2017 11:28   Ir Removal Tun Cv Cath W/o Fl  Result Date: 05/25/2017 INDICATION: Acute on chronic renal failure, improved.  Request removal of tunneled hemodialysis catheter placed on 05/08/2017. EXAM: REMOVAL OF TUNNELED RIGHT IJ HEMODIALYSIS CATHETER MEDICATIONS: None COMPLICATIONS: None immediate. PROCEDURE: Informed written consent was obtained from the patient following an explanation of the procedure, risks, benefits and alternatives to treatment. A time out was performed prior to the initiation of the procedure. Maximal barrier sterile technique was utilized including caps, mask, sterile gowns, sterile gloves, large sterile drape, hand hygiene, and chlorhexidine. Utilizing a combination of blunt dissection and gentle traction, the cuff of the catheter was exposed and catheter was removed intact. Hemostasis was obtained with manual compression. A dressing was placed. The patient tolerated the procedure well without immediate post procedural complication. IMPRESSION: Successful removal of tunneled right IJ hemodialysis catheter. Read by: Ascencion Dike PA-C Electronically Signed   By: Corrie Mckusick D.O.   On: 05/25/2017 09:57   Ir US Guide Vasc Access Right  Result Date: 05/08/2017 CLINICAL DATA:  Heart failure with LVAD. Renal insufficiency. Indwelling right subclavian temporary hemodialysis catheter. Needs long-term access for hemodialysis. EXAM: TUNNELED HEMODIALYSIS CATHETER PLACEMENT WITH ULTRASOUND AND FLUOROSCOPIC GUIDANCE TECHNIQUE: The procedure, risks, benefits, and alternatives were explained to the patient. Questions regarding the procedure were encouraged and answered. The patient understands and consents to the procedure. As antibiotic prophylaxis, cefazolin 2 g was ordered pre-procedure and administered intravenously within one hour of incision.Patency of the right IJ vein was confirmed with ultrasound with image documentation. An appropriate skin site was determined. Region was prepped using maximum barrier technique including cap and mask, sterile gown, sterile gloves, large sterile sheet, and Chlorhexidine as  cutaneous antisepsis. The region was infiltrated locally with 1% lidocaine. Intravenous Fentanyl and Versed were administered as conscious sedation during continuous monitoring of the patient's level of consciousness and physiological / cardiorespiratory status by the radiology RN, with a total moderate sedation time of 10 minutes. Under real-time ultrasound guidance, the right IJ vein was accessed with a 21 gauge micropuncture needle; the needle tip within the vein was confirmed with ultrasound image documentation. Needle exchanged over the 018 guidewire for transitional dilator, which allowed advancement of a Benson wire into the IVC. Over this, an MPA catheter was advanced. A Palindrome 23 hemodialysis catheter was tunneled from the right anterior chest wall approach to the right IJ dermatotomy site. The MPA catheter was exchanged over an Amplatz wire for serial vascular dilators which allow placement of a peel-away sheath, through which the catheter was advanced under intermittent fluoroscopy, positioned with its tips in the proximal and midright atrium. Spot chest radiograph confirms good catheter position. No pneumothorax. Catheter was flushed and primed per protocol. Catheter secured externally with O Prolene sutures. The right IJ dermatotomy site was closed with Dermabond. COMPLICATIONS: COMPLICATIONS None immediate FLUOROSCOPY TIME:  30 seconds; 4 mGy COMPARISON:  None IMPRESSION: 1. Technically successful placement of tunneled right IJ hemodialysis catheter with  ultrasound and fluoroscopic guidance. Ready for routine use. ACCESS: Remains approachable for percutaneous intervention as needed. Electronically Signed   By: Lucrezia Europe M.D.   On: 05/08/2017 11:28   Dg Chest Port 1 View  Result Date: 05/07/2017 CLINICAL DATA:  LVAD EXAM: PORTABLE CHEST 1 VIEW COMPARISON:  05/05/2017 FINDINGS: Support devices are stable. Cardiomegaly. Interstitial prominence may reflect interstitial edema. No pneumothorax. No  visible significant effusions. IMPRESSION: Suspect mild interstitial edema.  No change. Electronically Signed   By: Rolm Baptise M.D.   On: 05/07/2017 07:18   Vas [place] Upper Ext Vein Mapping (pre-op Avf)  Result Date: 05/14/2017 UPPER EXTREMITY VEIN MAPPING History: HD access. Examination Guidelines: A complete evaluation includes B-mode imaging, spectral doppler, color doppler, and power doppler as needed of all accessible portions of each vessel. Bilateral testing is considered an integral part of a complete examination. Limited examinations for reoccurring indications may be performed as noted. +-----------------+-------------+----------+---------+ Right Cephalic   Diameter (mm)Depth (mm)Findings  +-----------------+-------------+----------+---------+ Shoulder             3.30        7.10             +-----------------+-------------+----------+---------+ Prox upper arm       2.20        3.80             +-----------------+-------------+----------+---------+ Mid upper arm        1.30        2.50   branching +-----------------+-------------+----------+---------+ Dist upper arm       1.70        2.60             +-----------------+-------------+----------+---------+ Antecubital fossa    2.60        2.30             +-----------------+-------------+----------+---------+ Prox forearm         1.70        2.70             +-----------------+-------------+----------+---------+ Mid forearm          2.60        2.80             +-----------------+-------------+----------+---------+ Dist forearm         1.20        2.10             +-----------------+-------------+----------+---------+ +-----------------+-------------+----------+---------+ Left Cephalic    Diameter (mm)Depth (mm)Findings  +-----------------+-------------+----------+---------+ Shoulder             2.80        9.30             +-----------------+-------------+----------+---------+ Prox upper arm        2.40        4.30             +-----------------+-------------+----------+---------+ Mid upper arm        1.70        2.90             +-----------------+-------------+----------+---------+ Dist upper arm       2.50        3.60   branching +-----------------+-------------+----------+---------+ Antecubital fossa    2.10        1.80             +-----------------+-------------+----------+---------+ Prox forearm         2.40        3.00             +-----------------+-------------+----------+---------+  Mid forearm          1.60        2.40             +-----------------+-------------+----------+---------+ Dist forearm         1.40        2.40             +-----------------+-------------+----------+---------+ Final Interpretation: *See table(s) above for measurements and observations.  Harold Barban Electronically signed by Harold Barban on 05/14/2017 at 9:11:34 AM.    Disposition   Pt is being discharged home today in good condition.  Follow-up Plans & Appointments    Follow-up Information    [facility] Follow up on 06/08/2017.   Specialty:  Cardiology Why:  [place] Plan information: [address] [id number] mc [address] [place]       David [name]., MD Follow up.   Specialty:  Internal Medicine Why:  obtain CBC and PT/INR at PCP's office on Tuesday 06/06/2017 Contact information: [place] [address] [phone]        Bensimhon, Shaune Pascal, MD .   Specialty:  Cardiology Contact information: [address] [place] [address] [phone]          Discharge Instructions    Diet - low sodium heart healthy   Complete by:  As directed    Increase activity slowly   Complete by:  As directed       Discharge Medications   Allergies as of 06/04/2017      Reactions   Plavix [clopidogrel Bisulfate] Hives      Medication List      STOP taking these medications   aspirin 81 MG chewable tablet     TAKE these medications   acetaminophen 325 MG tablet Commonly known as:  TYLENOL Take 2 tablets (650 mg total) by mouth every 4 (four) hours as needed for headache or mild pain.   ADULT GUMMY Chew Chew 2 tablets by mouth daily.   atorvastatin 40 MG tablet Commonly known as:  LIPITOR Take 1 tablet (40 mg total) by mouth daily at 6 PM.   ferrous [id number] C-folic acid capsule Commonly known as:  TRINSICON / FOLTRIN Take 1 capsule by mouth 2 (two) times daily between meals.   furosemide 40 MG tablet Commonly known as:  LASIX Take 1 tablet (40 mg total) by mouth daily. Start taking on:  06/05/2017   Oxycodone HCl 10 MG Tabs Take 1 tablet (10 mg total) by mouth every 6 (six) hours as needed. What changed:  reasons to take this   pantoprazole 40 MG tablet Commonly known as:  PROTONIX Take 1 tablet (40 mg total) by mouth daily.   potassium chloride SA 20 MEQ tablet Commonly known as:  K-DUR,KLOR-CON Take 1 tablet (20 mEq total) by mouth daily. Start taking on:  06/05/2017   warfarin 2 MG tablet Commonly known as:  COUMADIN Take coumadin as instructed by coumadin clinic What changed:  additional instructions         Outstanding Labs/Studies   CBC and PT/INR at PCP's office on Tuesday 06/06/2017 and have result fax to Dr. Clayborne Dana office Fax [phone]  Duration of Discharge Encounter   Greater than 30 minutes including physician time.  Signed, Almyra Deforest PA-C 06/04/2017, 1:40 PM

## 2017-06-04 NOTE — Progress Notes (Signed)
Patient discharged home with wife and mother in law, prescriptions and paperwork packed with belongings. IVs removed and patient walked out to front entrance. Patient scheduled for VAD appointment 2/14

## 2017-06-04 NOTE — Progress Notes (Signed)
Patient ID: David [name], male   DOB: 1965/07/26, 52 y.o.   MRN: [id number]   Advanced Heart Failure VAD Team Note  PCP-Cardiologist: No primary care provider on file.   Subjective:    No complaints today.  Hgb 9 => 8.7.  Creatinine 1.96 => 1.7.  No BM. Feels good.  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    LVAD INTERROGATION:  HeartMate II LVAD:  Flow 4.8 liters/min, speed 5400, power 4, PI 3.3.    Objective:    Vital Signs:   Temp:  [97.8 F (36.6 C)-98.8 F (37.1 C)] 98.3 F (36.8 C) (02/10 1057) Pulse Rate:  [82-117] 91 (02/10 1057) Resp:  [15-23] 17 (02/10 1057) BP: (78-101)/(50-88) 89/75 (02/10 1057) SpO2:  [95 %-100 %] 98 % (02/10 1057) Weight:  [132 lb 4.8 oz (60 kg)-132 lb 8 oz (60.1 kg)] 132 lb 4.8 oz (60 kg) (02/10 0525) Last BM Date: 06/02/17 Mean arterial Pressure 80s  Intake/Output:   Intake/Output Summary (Last 24 hours) at 06/04/2017 1111 Last data filed at 06/04/2017 0944 Gross per 24 hour  Intake 1720 ml  Output 1825 ml  Net -105 ml     Physical Exam    GENERAL: Well appearing this am. NAD.  HEENT: Normal. NECK: Supple, JVP 7-8 cm. Carotids OK.  CARDIAC:  Mechanical heart sounds with LVAD hum present.  LUNGS:  CTAB, normal effort.  ABDOMEN:  NT, ND, no HSM. No bruits or masses. +BS  LVAD exit site: Well-healed and incorporated. Dressing dry and intact. No erythema or drainage. Stabilization device present and accurately applied. Driveline dressing changed daily per sterile technique. EXTREMITIES:  Warm and dry. No cyanosis, clubbing, rash, or edema.  NEUROLOGIC:  Alert & oriented x 3. Cranial nerves grossly intact. Moves all 4 extremities w/o difficulty. Affect pleasant    Telemetry   NSR, personally reviewed  Labs   Basic Metabolic Panel: Recent Labs  Lab 05/30/17 1000 05/31/17 0232 06/01/17 0252 06/02/17 0244 06/03/17 0233 06/04/17 0248  NA 135 135 137 135 138 137  K 4.6 4.3 2.9* 3.6 3.9 2.9*  CL 100* 104 104 105 103  103  CO2 21* 22 23 21* 24 23  GLUCOSE 120* 95 92 90 95 88  BUN 31* 28* 22* 21* 16 12  CREATININE 2.88* 2.59* 2.25* 2.09* 1.96* 1.70*  CALCIUM 8.2* 8.0* 7.6* 7.7* 8.0* 7.8*  MG 1.7  --   --  1.2*  --  1.4*    Liver Function Tests: Recent Labs  Lab 06/01/17 0252  AST 22  ALT 10*  ALKPHOS 83  BILITOT 1.3*  PROT 6.2*  ALBUMIN 1.7*   No results for input(s): LIPASE, AMYLASE in the last 168 hours. No results for input(s): AMMONIA in the last 168 hours.  CBC: Recent Labs  Lab 06/01/17 0252 06/02/17 0244 06/02/17 1640 06/03/17 0233 06/04/17 0248  WBC 12.1* 8.7 8.8 7.2 7.0  HGB 7.8* 8.4* 8.9* 9.0* 8.7*  HCT 25.2* 26.6* 28.7* 29.1* 27.9*  MCV 90.6 91.4 91.7 92.1 90.6  PLT 253 273 284 328 319    INR: Recent Labs  Lab 05/31/17 0232 06/01/17 0252 06/02/17 0244 06/03/17 0233 06/04/17 0248  INR 3.27 3.79 3.44 2.57 2.17    Other results:  EKG:    Imaging   No results found.   Medications:     Scheduled Medications: . atorvastatin  40 mg Oral q1800  . docusate sodium  200 mg Oral Daily  . feeding supplement  1  Container Oral BID BM  . ferrous [id number] C-folic acid  1 capsule Oral BID BM  . [START ON 06/05/2017] furosemide  40 mg Oral Daily  . pantoprazole  40 mg Oral Daily  . [START ON 06/05/2017] potassium chloride  20 mEq Oral Daily  . potassium chloride  40 mEq Oral Once  . Warfarin - Pharmacist Dosing Inpatient   Does not apply q1800    Infusions: . magnesium sulfate 1 - 4 g bolus IVPB      PRN Medications: acetaminophen, ondansetron (ZOFRAN) IV, oxyCODONE    Assessment/Plan:    1. Symptomatic Anemiadue to GI bleeding/melena: On 2/2 hgb was 7.1. 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  Melena over the weekend. Hgb 3.8 on admit. Received 3 units PRBCs. Today's hgb 8.7 (9 yesterday).  No further overt GI bleeding. INR 2.17.  EGD yesterday with 4 gastric AVMs ablated.  - Will need colonoscopy, can arrange as outpatient.   - He will stay off ASA.   - Warfarin restarted, goal INR 2-2.5.  He is at 2.17 today.  2. Chronic systolic CHF with HM III LVAD implant 04/20/17: Volume status ok on exam, MAP at goal.  LVAD parameters stable.  - Decrease Lasix to 40 mg daily with KCl 20 daily for home.   3. H/O of AKI on CKD Stage III: Recent admit required CVVHD-->iHD. Improved with HD catheter removed prior to discharge.  Creatinine down from 2.8>2.59>2.25>2.1>1.96>1.7.    4. PAF: Maintaining NSR.  5. CAD:  s/p PCI RCA/PLOM with DES x2 and DES to mild LAD 1/18 - Continue statin.  6. Disposition: May go home today.  Has followup in LVAD clinic Thursday but should have CBC/INR on Tuesday.  Meds for discharge: warfarin goal INR 2-2.5, Lasix 40 mg daily, KCl 20 daily, Protonix 40 daily, atorvastatin 40 daily.   I reviewed the LVAD parameters from today, and compared the results to the patient's prior recorded data.  No programming changes were made.  The LVAD is functioning within specified parameters.  The patient performs LVAD self-test daily.  LVAD interrogation was negative for any significant power changes, alarms or PI events/speed drops.  LVAD equipment check completed and is in good working order.  Back-up equipment present.   LVAD education done on emergency procedures and precautions and reviewed exit site care.  Length of Stay: 5  Loralie Champagne, MD 06/04/2017, 11:11 AM  VAD Team --- VAD ISSUES ONLY--- Pager [phone] (7am - 7am)  Advanced Heart Failure Team  Pager [phone] (M-F; 7a - 4p)  Please contact [facility] for night-coverage after hours (4p -7a ) and weekends on [email or web address]

## 2017-06-04 NOTE — Progress Notes (Signed)
Wife called following patient discharge, said patient needs new orders for home health nurse/PT according to [place]. On-call AHF physician paged, returned phone call and has made note of request to address tomorrow. Returned call to wife, who said [facility] nurse will also contact Dr. Haroldine Laws tomorrow AM.

## 2017-06-04 NOTE — Progress Notes (Signed)
          Daily Rounding Note  06/04/2017, 8:16 AM  LOS: 5 days   SUBJECTIVE:   Chief complaint: None.      Stools dark.  Eating well.  No breathing problems.    OBJECTIVE:         Vital signs in last 24 hours:    Temp:  [97.8 F (36.6 C)-98.8 F (37.1 C)] 98.4 F (36.9 C) (02/10 0728) Pulse Rate:  [82-117] 90 (02/10 0728) Resp:  [15-23] 23 (02/10 0728) BP: (78-101)/(50-86) 99/85 (02/10 0728) SpO2:  [95 %-100 %] 96 % (02/10 0728) Weight:  [60 kg (132 lb 4.8 oz)-60.1 kg (132 lb 8 oz)] 60 kg (132 lb 4.8 oz) (02/10 0525) Last BM Date: 06/02/17 Filed Weights   06/03/17 0533 06/03/17 1206 06/04/17 0525  Weight: 60.1 kg (132 lb 8 oz) 60.1 kg (132 lb 8 oz) 60 kg (132 lb 4.8 oz)   General: looks unwell.  Poor, ashen coloring   Heart: VAD hum Chest: clear bil.  No dyspnea or cough Abdomen: soft, NT, active BS.  driveline site covered with bandage  Extremities: no CCE Neuro/Psych:  Oriented x 3.  Fully alert.    Intake/Output from previous day: 02/09 0701 - 02/10 0700 In: 1320 [P.O.:920; I.V.:400] Out: 1650 [Urine:1650]  Intake/Output this shift: No intake/output data recorded.  Lab Results: Recent Labs    06/02/17 1640 06/03/17 0233 06/04/17 0248  WBC 8.8 7.2 7.0  HGB 8.9* 9.0* 8.7*  HCT 28.7* 29.1* 27.9*  PLT 284 328 319   BMET Recent Labs    06/02/17 0244 06/03/17 0233 06/04/17 0248  NA 135 138 137  K 3.6 3.9 2.9*  CL 105 103 103  CO2 21* 24 23  GLUCOSE 90 95 88  BUN 21* 16 12  CREATININE 2.09* 1.96* 1.70*  CALCIUM 7.7* 8.0* 7.8*   LFT No results for input(s): PROT, ALBUMIN, AST, ALT, ALKPHOS, BILITOT, BILIDIR, IBILI in the last 72 hours. PT/INR Recent Labs    06/03/17 0233 06/04/17 0248  LABPROT 27.4* 24.0*  INR 2.57 2.17   Hepatitis Panel No results for input(s): HEPBSAG, HCVAB, HEPAIGM, HEPBIGM in the last 72 hours.  Studies/Results: No results found.   Scheduled Meds: . atorvastatin  40  mg Oral q1800  . docusate sodium  200 mg Oral Daily  . feeding supplement  1 Container Oral BID BM  . ferrous [id number] C-folic acid  1 capsule Oral BID BM  . furosemide  80 mg Oral Daily  . pantoprazole  40 mg Oral Daily  . Warfarin - Pharmacist Dosing Inpatient   Does not apply q1800   Continuous Infusions: PRN Meds:.acetaminophen, ondansetron (ZOFRAN) IV, oxyCODONE   ASSESMENT:   *  Symptomatic anemia and loose black stool.  S/p PRBC x 3.  hgb 3.8 >> 8.7 .  On chronic Trinsicon.   2/9 enteroscopy: APC ablation of 4, non-bleeding AVMs.    *  Coumadin therapy in LVAD pt, restarted 2/9.  INR 2.1  *  Hypokalemia.  Hypomagnesemia.    *  CKD stage 3, recent AKI.    *  Hypoalbuminemia.    PLAN   *  Will need screening colonoscopy in future given age and no priors.    *  Continue daily Protonix.  GI signing off.     Azucena Freed  06/04/2017, 8:16 AM Pager: [phone]

## 2017-06-04 NOTE — Progress Notes (Signed)
ANTICOAGULATION CONSULT NOTE - Initial Consult  Pharmacy Consult for warfarin Indication: LVAD  Allergies  Allergen Reactions  . Plavix [Clopidogrel Bisulfate] Hives    Patient Measurements: Height: 5\' 5"  (165.1 cm) Weight: 132 lb 4.8 oz (60 kg) IBW/kg (Calculated) : 61.5   Vital Signs: Temp: 98.3 F (36.8 C) (02/10 1057) Temp Source: Oral (02/10 1057) BP: 95/74 (02/10 1304) Pulse Rate: 91 (02/10 1057)  Labs: Recent Labs    06/02/17 0244 06/02/17 1640 06/03/17 0233 06/04/17 0248  HGB 8.4* 8.9* 9.0* 8.7*  HCT 26.6* 28.7* 29.1* 27.9*  PLT 273 284 328 319  LABPROT 34.4*  --  27.4* 24.0*  INR 3.44  --  2.57 2.17  CREATININE 2.09*  --  1.96* 1.70*    Estimated Creatinine Clearance: 42.6 mL/min (A) (by C-G formula based on SCr of 1.7 mg/dL (H)).   Medical History: Past Medical History:  Diagnosis Date  . AICD (automatic cardioverter/defibrillator) present   . Anemia   . Anxiety   . CAD (coronary artery disease) 2009   AMI in 12/2007 with PCI to LAD, staged PCI to  mid/distal RCA, NSTEMI in 02/2009 with BMS to LCx  . CHF (congestive heart failure) ([place]   . Chronic kidney disease 11/03/2016   stage 3 kidney disease  . Dysrhythmia    "arrythmia problems at some point", "fatal rhythms"  . History of blood transfusion    "I was bleeding from was rectum" (04/19/2017)  . HLD (hyperlipidemia)   . HTN (hypertension)   . Ischemic cardiomyopathy    Admitted in 07/2010 with CHF exacerbation  . Myocardial infarction ([place])    "I've had 4" (04/19/2017)  . Pneumonia 2018 X 1  . Seizures ([place])    one seizure in 04/2016 during cardiac event (04/19/2017)     Assessment: 51yom with HF s/p LVAD HM3 implant 12/18.  Admitted with low Hgb, melena and symptomatic anemia.  Hgb improved 3.8>9 with PRBC.  s/p endoscopy 4 non bleeding AVMs treated APC.  INR 3.8 warfarin held > 2. 6> warf 0.5mg  x1 > 2.1 .    Goal of Therapy:  INR 2-2.5  Monitor platelets by anticoagulation  protocol: Yes   Plan:  Warfarin 2mg  x1 then 1mg  daily until LVAD clinic f/u Directions given to him and wife Plan dc home today  Bonnita Nasuti Pharm.D. CPP, BCPS Clinical Pharmacist [phone] 06/04/2017 1:52 PM

## 2017-06-04 NOTE — Progress Notes (Signed)
Follow up appt made with Dr. Haroldine Laws and [place] coordinators for Thursday, 2/14 at 10 am. Please page VAD coordinator with any questions.  Tanda Rockers RN, BSN VAD Coordinator 24/7 Pager [phone]

## 2017-06-05 ENCOUNTER — Other Ambulatory Visit: Payer: Self-pay

## 2017-06-05 ENCOUNTER — Telehealth: Payer: Self-pay

## 2017-06-05 ENCOUNTER — Telehealth: Payer: Self-pay | Admitting: Internal Medicine

## 2017-06-05 ENCOUNTER — Encounter (HOSPITAL_COMMUNITY): Payer: Self-pay | Admitting: Gastroenterology

## 2017-06-05 DIAGNOSIS — K922 Gastrointestinal hemorrhage, unspecified: Secondary | ICD-10-CM

## 2017-06-05 NOTE — Telephone Encounter (Signed)
Left message for patient to call back  

## 2017-06-05 NOTE — Telephone Encounter (Signed)
Called phone number, it belongs to his daughter [name]. Left message for her to call back, needing her father to come in for lab work.

## 2017-06-05 NOTE — Telephone Encounter (Signed)
Spoke to patient's wife, they will be at the cardiologist on Thursday and will have that lab work done there.

## 2017-06-05 NOTE — Telephone Encounter (Signed)
New message   Morey Hummingbird at [facility] is calling because the pt was just released from the hospital and wants to [facility] to back for his care but she never rcvd a resumption order and wants to know what can be done to to get the care services started back again. Please call

## 2017-06-05 NOTE — Telephone Encounter (Signed)
-----   Message from Yetta Flock, MD sent at 06/04/2017  9:16 AM EST ----- Regarding: follow up labs Hi Almyra Free, This patient will need a CBC on Wed or Thursday following his recent hospitalization with me. Can you contact him help coordinate this? Thanks

## 2017-06-05 NOTE — Telephone Encounter (Signed)
Message routed to the LVAD coordinators to manage.

## 2017-06-05 NOTE — Telephone Encounter (Signed)
Patient daughter returning call  °

## 2017-06-06 ENCOUNTER — Telehealth: Payer: Self-pay | Admitting: Licensed Clinical Social Worker

## 2017-06-06 NOTE — Telephone Encounter (Signed)
I alerted Carolynn Sayers to this issue yesterday after receiving call from patients wife. HHRN and HHPT has been resumed. Thanks!!

## 2017-06-06 NOTE — Telephone Encounter (Signed)
CSW contacted patient's wife to offer support. Wife reports she is managing and "hanging in". She states that patient has an appointment on Thursday and will follow up. CSW offered support and will continue to be available as needed. Raquel Sarna, [place], [place]

## 2017-06-07 ENCOUNTER — Other Ambulatory Visit (HOSPITAL_COMMUNITY): Payer: Self-pay | Admitting: *Deleted

## 2017-06-07 ENCOUNTER — Telehealth (HOSPITAL_COMMUNITY): Payer: Self-pay | Admitting: *Deleted

## 2017-06-07 DIAGNOSIS — Z95811 Presence of heart assist device: Secondary | ICD-10-CM

## 2017-06-07 DIAGNOSIS — Z7901 Long term (current) use of anticoagulants: Secondary | ICD-10-CM

## 2017-06-07 DIAGNOSIS — I5043 Acute on chronic combined systolic (congestive) and diastolic (congestive) heart failure: Secondary | ICD-10-CM

## 2017-06-07 NOTE — Telephone Encounter (Signed)
Patient had CBC and INR drawn by [facility] nurse yesterday. [place] - lab results not available yet.   Pt is scheduled for clinic visit tomorrow here.

## 2017-06-08 ENCOUNTER — Ambulatory Visit (HOSPITAL_COMMUNITY)
Admission: RE | Admit: 2017-06-08 | Discharge: 2017-06-08 | Disposition: A | Discharge status: Home / Self Care | Payer: Medicare HMO | Source: Ambulatory Visit | Attending: Internal Medicine | Admitting: Internal Medicine

## 2017-06-08 ENCOUNTER — Encounter (HOSPITAL_COMMUNITY): Payer: Self-pay

## 2017-06-08 ENCOUNTER — Ambulatory Visit (HOSPITAL_COMMUNITY): Payer: Self-pay | Admitting: Pharmacist

## 2017-06-08 VITALS — BP 92/0 | HR 106 | Ht 65.0 in | Wt 140.0 lb

## 2017-06-08 DIAGNOSIS — Z95810 Presence of automatic (implantable) cardiac defibrillator: Secondary | ICD-10-CM | POA: Insufficient documentation

## 2017-06-08 DIAGNOSIS — I252 Old myocardial infarction: Secondary | ICD-10-CM | POA: Diagnosis not present

## 2017-06-08 DIAGNOSIS — I5022 Chronic systolic (congestive) heart failure: Secondary | ICD-10-CM | POA: Diagnosis not present

## 2017-06-08 DIAGNOSIS — E785 Hyperlipidemia, unspecified: Secondary | ICD-10-CM | POA: Diagnosis not present

## 2017-06-08 DIAGNOSIS — N179 Acute kidney failure, unspecified: Secondary | ICD-10-CM | POA: Diagnosis not present

## 2017-06-08 DIAGNOSIS — F419 Anxiety disorder, unspecified: Secondary | ICD-10-CM | POA: Diagnosis not present

## 2017-06-08 DIAGNOSIS — I480 Paroxysmal atrial fibrillation: Secondary | ICD-10-CM | POA: Insufficient documentation

## 2017-06-08 DIAGNOSIS — K922 Gastrointestinal hemorrhage, unspecified: Secondary | ICD-10-CM | POA: Diagnosis not present

## 2017-06-08 DIAGNOSIS — I5043 Acute on chronic combined systolic (congestive) and diastolic (congestive) heart failure: Secondary | ICD-10-CM

## 2017-06-08 DIAGNOSIS — I255 Ischemic cardiomyopathy: Secondary | ICD-10-CM | POA: Insufficient documentation

## 2017-06-08 DIAGNOSIS — N183 Chronic kidney disease, stage 3 (moderate): Secondary | ICD-10-CM | POA: Diagnosis not present

## 2017-06-08 DIAGNOSIS — Z7901 Long term (current) use of anticoagulants: Secondary | ICD-10-CM | POA: Insufficient documentation

## 2017-06-08 DIAGNOSIS — Z95811 Presence of heart assist device: Secondary | ICD-10-CM | POA: Insufficient documentation

## 2017-06-08 DIAGNOSIS — D649 Anemia, unspecified: Secondary | ICD-10-CM | POA: Diagnosis not present

## 2017-06-08 DIAGNOSIS — Z79899 Other long term (current) drug therapy: Secondary | ICD-10-CM | POA: Insufficient documentation

## 2017-06-08 DIAGNOSIS — I2510 Atherosclerotic heart disease of native coronary artery without angina pectoris: Secondary | ICD-10-CM | POA: Diagnosis not present

## 2017-06-08 DIAGNOSIS — R0789 Other chest pain: Secondary | ICD-10-CM | POA: Insufficient documentation

## 2017-06-08 DIAGNOSIS — I130 Hypertensive heart and chronic kidney disease with heart failure and stage 1 through stage 4 chronic kidney disease, or unspecified chronic kidney disease: Secondary | ICD-10-CM | POA: Diagnosis not present

## 2017-06-08 DIAGNOSIS — Z955 Presence of coronary angioplasty implant and graft: Secondary | ICD-10-CM | POA: Insufficient documentation

## 2017-06-08 LAB — BASIC METABOLIC PANEL
ANION GAP: 9 (ref 5–15)
BUN: 7 mg/dL (ref 6–20)
CHLORIDE: 107 mmol/L (ref 101–111)
CO2: 23 mmol/L (ref 22–32)
Calcium: 8.7 mg/dL — ABNORMAL LOW (ref 8.9–10.3)
Creatinine, Ser: 1.46 mg/dL — ABNORMAL HIGH (ref 0.61–1.24)
GFR calc Af Amer: >60 mL/min (ref >60)
GFR calc non Af Amer: 53 mL/min — ABNORMAL LOW (ref >60)
GLUCOSE: 100 mg/dL — AB (ref 65–99)
POTASSIUM: 4 mmol/L (ref 3.5–5.1)
Sodium: 139 mmol/L (ref 135–145)

## 2017-06-08 LAB — PROTIME-INR
INR: 1.85
Prothrombin Time: 21.2 seconds — ABNORMAL HIGH (ref 11.4–15.2)

## 2017-06-08 LAB — MAGNESIUM: MAGNESIUM: 1.4 mg/dL — AB (ref 1.7–2.4)

## 2017-06-08 LAB — CBC
HCT: 34 % — ABNORMAL LOW (ref 39.0–52.0)
Hemoglobin: 10.5 g/dL — ABNORMAL LOW (ref 13.0–17.0)
MCH: 28 pg (ref 26.0–34.0)
MCHC: 30.9 g/dL (ref 30.0–36.0)
MCV: 90.7 fL (ref 78.0–100.0)
Platelets: 361 10*3/uL (ref 150–400)
RBC: 3.75 MIL/uL — AB (ref 4.22–5.81)
RDW: 16.8 % — ABNORMAL HIGH (ref 11.5–15.5)
WBC: 10.5 10*3/uL (ref 4.0–10.5)

## 2017-06-08 LAB — LACTATE DEHYDROGENASE: LDH: 227 U/L — ABNORMAL HIGH (ref 98–192)

## 2017-06-08 MED ORDER — OXYCODONE HCL 10 MG PO TABS: 10.0000 mg | ORAL_TABLET | Freq: Four times a day (QID) | ORAL | 0 refills | Status: DC | PRN

## 2017-06-08 MED ORDER — TRAMADOL HCL ER 100 MG PO TB24: 100.0000 mg | ORAL_TABLET | Freq: Three times a day (TID) | ORAL | 0 refills | Status: DC | PRN

## 2017-06-08 MED ORDER — GABAPENTIN 300 MG PO CAPS: 300.0000 mg | ORAL_CAPSULE | Freq: Every day | ORAL | 3 refills | Status: DC

## 2017-06-08 MED ORDER — MAGNESIUM OXIDE 400 MG PO CAPS: 400.0000 mg | ORAL_CAPSULE | Freq: Two times a day (BID) | ORAL | 6 refills | Status: DC

## 2017-06-08 NOTE — Progress Notes (Signed)
Patient presents for hospital discharge follow up in [facility] today. Reports no problems with VAD equipment or concerns with drive line.  Pt states that this is the best he has felt since surgery. Pt states that he is still having quite a bit of pain in his sternum and is requesting pain medication.   Vital Signs:  Doppler Pressure 92   Automatc BP: 102/74 (89) HR:106  SPO2:99  %  Weight: 140 lb w/o eqt Last weight: 143 lb  VAD Indication: Destination Therapy- CKD    VAD interrogation & Equipment Management: Speed:5400 Flow: 4.3 Power:4.1 w    PI:5.6  Alarms: pt had several low voltage and no external power alarms. Pt was educated on battery life and also on unplugging the MPU from the wall. Caregiver states that she has the MPU plugged into a drop cord. Pt/caregiver educated that the MPU cannot be plugged into a drop cord, that it must be plugged into the wall. Pt/caregiver verbalized understanding and will rearrange their bedroom so that he is not using a drop cord.  Events: 3-5 PI events daily  Fixed speed 5400 Low speed limit: 5100  Primary Controller:  Replace back up battery in 30 months. Back up controller:  pt/cargegiver states they left the black bag at home on accident.   Annual Equipment Maintenance on UBC/PM was performed on 03/2017.   I reviewed the LVAD parameters from today and compared the results to the patient's prior recorded data. LVAD interrogation was NEGATIVE for significant power changes, NEGATIVE for clinical alarms and STABLE for PI events/speed drops. No programming changes were made and pump is functioning within specified parameters. Pt is performing daily controller and system monitor self tests along with completing weekly and monthly maintenance for LVAD equipment.  LVAD equipment check completed and is in good working order. Back-up equipment present. Charged back up battery and performed self-test on equipment.   Exit Site Care: Drive  line is being maintained every other day  by his wife, [name]. Drive line exit site well healed and incorporated. The velour is fully implanted at exit site. Dressing dry and intact. No erythema or drainage. There is a large scab noted around the driveline. Stabilization device present and accurately applied. Pt denies fever or chills. Pt states they have adequate dressing supplies at home.   Significant Events on VAD Support:  none  Device:[employer] Biv Therapies: on Last check: 05/30/2017   BP & Labs:  MAP 89 - Doppler is reflecting MAP  Hgb 10.5 - Denies s/s of bleeding  LDH stable at 227 with established baseline of 170- 250. Denies tea-colored urine. No power elevations noted on interrogation.   Mg: 1.4--will start Magnesium Oxide 400 mg BID.   Plan: 1.Continue twice a week dressing changes. 2. Start Neurontin 300 mg daily 3. Start Tramadol 100 mg every 8 hours. Try to take Tramadol and Neurontin opposed to Oxycodone. 4. Pt was provided with Oxycodone script today. If pt continues to require narcotics he will need to be referred to a pain clinic per Dr. Haroldine Laws. 5. Start Magnesium 400 mg twice daily. 6. Return to clinic in 1 week for full visit.   Tanda Rockers RN [place] Coordinator   Office: [phone] 24/7 Emergency VAD Pager: [phone]

## 2017-06-08 NOTE — Progress Notes (Signed)
CSW met in the clinic with patient and wife. Patient stated he is feeling great and wife states ok. CSW encouraged attendance at Support Group this coming Monday and provided information about group and walking club. Patient and wife appreciative and plan on attending Monday. Wife appears to have improved spirits and denies any concerns at this time. CSW continues to follow for support and any other needs as identified. Raquel Sarna, [place], [place]

## 2017-06-08 NOTE — Addendum Note (Signed)
Encounter addended by: Louann Liv, LCSW on: 06/08/2017 3:57 PM  Actions taken: Sign clinical note

## 2017-06-08 NOTE — Progress Notes (Signed)
LVAD CLINIC FOLLOW-UP NOTE   HPI:  Mr David [name] is a 52 year old with history of ICD (09 PCI to LAD, mRCA, '10 PCI to Lcx and '12 mRCA), HFrEF (EF ~20% for >5 years), HLD and HTN. S/P LVAD HM-III 04/20/18.   Admitted 04/19/17 through 05/27/2017 for scheduled LVAD implant. On 12/27 he underwent HMIII LVAD implant. Hospital course complicated by PNA with achromobacter tracheal aspirate, shock liver, and AKI. Required short term dialysis.  Readmitted 05/30/17 with symptomatic anemia hgb 3.8.  INR 2.9. He was transfused with 4 units of packed red blood cell. 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  It was suspected AVMs were the likely cause of bleeding in the setting of anticoagulation.  He will need colonoscopy at some point, this can be done as outpatient. Discharged 06/04/17.  Returns today for routine f/u. Energy level improving.Denies SOB. Main complaint is chest soreness. No further melena. No ab pain or dizziness. Denies orthopnea or PND. No fevers, chills or problems with driveline. No bleeding, melena or neuro symptoms. No VAD alarms. Taking all meds as prescribed.    Follow up for Heart Failure/LVAD:   Denies LVAD alarms.  Denies driveline trauma, erythema or drainage.  Denies ICD shocks.   Reports taking Coumadin as prescribed and adherence to anticoagulation based dietary restrictions.  Denies bright red blood per rectum or melena, no dark urine or hematuria.     Past Medical History:  Diagnosis Date  . AICD (automatic cardioverter/defibrillator) present   . Anemia   . Anxiety   . CAD (coronary artery disease) 2009   AMI in 12/2007 with PCI to LAD, staged PCI to  mid/distal RCA, NSTEMI in 02/2009 with BMS to LCx  . CHF (congestive heart failure) ([place])   . Chronic kidney disease 11/03/2016   stage 3 kidney disease  .  Dysrhythmia    "arrythmia problems at some point", "fatal rhythms"  . History of blood transfusion    "I was bleeding from was rectum" (04/19/2017)  . HLD (hyperlipidemia)   . HTN (hypertension)   . Ischemic cardiomyopathy    Admitted in 07/2010 with CHF exacerbation  . Myocardial infarction ([place])    "I've had 4" (04/19/2017)  . Pneumonia 2018 X 1  . Seizures ([place])    one seizure in 04/2016 during cardiac event (04/19/2017)    Current Outpatient Medications  Medication Sig Dispense Refill  . atorvastatin (LIPITOR) 40 MG tablet Take 1 tablet (40 mg total) by mouth daily at 6 PM. 30 tablet 6  . ferrous [id number] C-folic acid (TRINSICON / FOLTRIN) capsule Take 1 capsule by mouth 2 (two) times daily between meals. 60 capsule 3  . furosemide (LASIX) 40 MG tablet Take 1 tablet (40 mg total) by mouth daily. 30 tablet 2  . Multiple Vitamins-Minerals (ADULT GUMMY) CHEW Chew 2 tablets by mouth daily.    . Oxycodone HCl 10 MG TABS Take 1 tablet (10 mg total) by mouth every 6 (six) hours as needed. 30 tablet 0  . pantoprazole (PROTONIX) 40 MG tablet Take 1 tablet (40 mg total) by mouth daily. 30 tablet 6  . potassium chloride SA (K-DUR,KLOR-CON) 20 MEQ tablet Take 1 tablet (20 mEq total) by mouth daily. 30 tablet 2  . acetaminophen (TYLENOL) 325 MG tablet Take 2 tablets (650 mg total) by mouth every 4 (four) hours as needed for headache or  mild pain. (Patient not taking: Reported on 06/08/2017)    . gabapentin (NEURONTIN) 300 MG capsule Take 1 capsule (300 mg total) by mouth daily. 30 capsule 3  . Magnesium Oxide 400 MG CAPS Take 1 capsule (400 mg total) by mouth 2 (two) times daily. 60 capsule 6  . traMADol (ULTRAM-ER) 100 MG 24 hr tablet Take 1 tablet (100 mg total) by mouth every 8 (eight) hours as needed for pain. 120 tablet 0  . warfarin (COUMADIN) 2 MG tablet Take 1/2 tablet (1 mg) daily except 1 tablet (2 mg) on Thursdays     No current facility-administered medications for this  encounter.     Plavix [clopidogrel bisulfate]  REVIEW OF SYSTEMS: All systems negative except as listed in HPI, PMH and Problem list.   VAD Indication: Destination Therapy- CKD    VAD interrogation & Equipment Management: Speed:5400 Flow: 4.3 Power:4.1 w PI:5.6  Alarms: pt had several low voltage and no external power alarms. Pt was educated on battery life and also on unplugging the MPU from the wall. Caregiver states that she has the MPU plugged into a drop cord. Pt/caregiver educated that the MPU cannot be plugged into a drop cord, that it must be plugged into the wall. Pt/caregiver verbalized understanding and will rearrange their bedroom so that he is not using a drop cord.  Events: 3-5 PI events daily  Fixed speed 5400 Low speed limit: 5100  Primary Controller: Replace back up battery in 30 months. Back up controller: pt/cargegiver states they left the black bag at home on accident.   Annual Equipment Maintenance on UBC/PM was performed on 03/2017.   I reviewed the LVAD parameters from today, and compared the results to the patient's prior recorded data.  No programming changes were made.  The LVAD is functioning within specified parameters.  The patient performs LVAD self-test daily.  LVAD interrogation was negative for any significant power changes, alarms or PI events/speed drops.  LVAD equipment check completed and is in good working order.  Back-up equipment present.   LVAD education done on emergency procedures and precautions and reviewed exit site care.    Vitals:   06/08/17 0918 06/08/17 0919  BP: 102/74 (!) 92/0  Pulse: (!) 106   SpO2: 99%   Weight: 140 lb (63.5 kg)   Height: 5\' 5"  (1.651 m)    Vital Signs:  Doppler Pressure 92                Automatc BP: 102/74 (89) HR:106 SPO2:99  %  Weight: 140 lb w/o eqt Last weight: 143 lb  Physical Exam: GENERAL: Well appearing, male who presents to clinic today in no acute distress. HEENT: normal    NECK: Supple, JVP 6.  2+ bilaterally, no bruits.  No lymphadenopathy or thyromegaly appreciated.   CARDIAC:  Mechanical heart sounds with LVAD hum present.  LUNGS:  Clear to auscultation bilaterally.  ABDOMEN:  Soft, round, nontender, positive bowel sounds x4.     LVAD exit site: well-healed and incorporated.  Dressing dry and intact.  No erythema or drainage.  Stabilization device present and accurately applied.  Driveline dressing is being changed daily per sterile technique. EXTREMITIES:  Warm and dry, no cyanosis, clubbing, rash or edema  NEUROLOGIC:  Alert and oriented x 4.  Gait steady.  No aphasia.  No dysarthria.  Affect pleasant.      ASSESSMENT AND PLAN:   1. Chronic systolic CHF with HM III LVAD implant 04/20/17:  - Doing well.  NYHA II - Volume status stable.  - Continue lasix 40 daily 2. 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 Goal INR still 2.0-2.5 3. H/O of AKI on CKD Stage III:  - Recent admit required CVVHD-->iHD. Improved with HD catheter removed prior to discharge.  - Creatinine down to 1.46 today 4. VAD management - VAD interrogated personally. Parameters stable. - LDH stable 227 - INR goal 2.0-2.5 Off ASA due to GIB 2/19. INR today 1.85. Discussed dosing with PharmD personally. - MAPs slightly elevated. Will follow. Can start losartan or Entresto as needed.  5, Chest/pocket pain - discussed need to limit narcotics - will start neurontin 300 daily - tramadol 100 tid - oxycodone 10mg  q8hr prn for breakthrough pain only dispense #30. (If needs more will refer to pain clinic) 6. PAF:  -Maintaining NSR.  7. CAD:  s/p PCI RCA/PLOM with DES x2 and DES to mild LAD 1/18 - No s/s angina. Continue statin.  8. Hypomagnesemia. - Start magnesium oxide 400 bid   Glori Bickers, MD  1:29 PM  .

## 2017-06-09 ENCOUNTER — Other Ambulatory Visit (HOSPITAL_COMMUNITY): Payer: Self-pay | Admitting: Pharmacist

## 2017-06-09 ENCOUNTER — Other Ambulatory Visit (HOSPITAL_COMMUNITY): Payer: Self-pay | Admitting: *Deleted

## 2017-06-09 ENCOUNTER — Telehealth: Payer: Self-pay

## 2017-06-09 MED ORDER — TRAMADOL HCL 50 MG PO TABS: 100.0000 mg | ORAL_TABLET | Freq: Three times a day (TID) | ORAL | 0 refills | Status: DC | PRN

## 2017-06-09 NOTE — Telephone Encounter (Signed)
Patient had appointment with his cardiologist on Thursday and had labs done there, results in chart for CBC. Do you want me to put this patient on a recall for colonoscopy? I see where you mentioned he needed to schedule one in EGD report from hospital. Thanks.

## 2017-06-12 NOTE — Telephone Encounter (Signed)
Thanks David [name]. Yes his Hgb has uptrended nicely. 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 He does need a colonoscopy but I would like to see him in clinic first. Can you book him with me first available in the office, next 1-2 months? Thanks

## 2017-06-12 NOTE — Telephone Encounter (Signed)
Called patient left message about the lab results and upcoming appointment. Mailed letter with results and follow up recommendations and appointment date/time.

## 2017-06-15 ENCOUNTER — Ambulatory Visit (HOSPITAL_COMMUNITY): Payer: Self-pay | Admitting: Pharmacist

## 2017-06-15 ENCOUNTER — Encounter (HOSPITAL_COMMUNITY): Payer: Self-pay

## 2017-06-15 ENCOUNTER — Ambulatory Visit (HOSPITAL_COMMUNITY)
Admission: RE | Admit: 2017-06-15 | Discharge: 2017-06-15 | Disposition: A | Discharge status: Home / Self Care | Payer: Medicare HMO | Source: Ambulatory Visit | Attending: Cardiology | Admitting: Cardiology

## 2017-06-15 ENCOUNTER — Other Ambulatory Visit (HOSPITAL_COMMUNITY): Payer: Self-pay | Admitting: *Deleted

## 2017-06-15 ENCOUNTER — Telehealth (HOSPITAL_COMMUNITY): Payer: Self-pay | Admitting: Cardiology

## 2017-06-15 VITALS — BP 102/91 | HR 92 | Resp 16 | Ht 65.0 in | Wt 142.6 lb

## 2017-06-15 DIAGNOSIS — Z95811 Presence of heart assist device: Secondary | ICD-10-CM

## 2017-06-15 DIAGNOSIS — N183 Chronic kidney disease, stage 3 unspecified: Secondary | ICD-10-CM

## 2017-06-15 DIAGNOSIS — E7849 Other hyperlipidemia: Secondary | ICD-10-CM | POA: Diagnosis not present

## 2017-06-15 DIAGNOSIS — D649 Anemia, unspecified: Secondary | ICD-10-CM | POA: Diagnosis not present

## 2017-06-15 DIAGNOSIS — I480 Paroxysmal atrial fibrillation: Secondary | ICD-10-CM | POA: Diagnosis not present

## 2017-06-15 DIAGNOSIS — I10 Essential (primary) hypertension: Secondary | ICD-10-CM | POA: Diagnosis not present

## 2017-06-15 DIAGNOSIS — I2510 Atherosclerotic heart disease of native coronary artery without angina pectoris: Secondary | ICD-10-CM | POA: Diagnosis not present

## 2017-06-15 DIAGNOSIS — Z79891 Long term (current) use of opiate analgesic: Secondary | ICD-10-CM | POA: Insufficient documentation

## 2017-06-15 DIAGNOSIS — I5022 Chronic systolic (congestive) heart failure: Secondary | ICD-10-CM

## 2017-06-15 DIAGNOSIS — Z7901 Long term (current) use of anticoagulants: Secondary | ICD-10-CM

## 2017-06-15 DIAGNOSIS — I130 Hypertensive heart and chronic kidney disease with heart failure and stage 1 through stage 4 chronic kidney disease, or unspecified chronic kidney disease: Secondary | ICD-10-CM | POA: Diagnosis present

## 2017-06-15 DIAGNOSIS — Z79899 Other long term (current) drug therapy: Secondary | ICD-10-CM | POA: Insufficient documentation

## 2017-06-15 LAB — MAGNESIUM: Magnesium: 1.5 mg/dL — ABNORMAL LOW (ref 1.7–2.4)

## 2017-06-15 LAB — CBC
HCT: 36.2 % — ABNORMAL LOW (ref 39.0–52.0)
HEMOGLOBIN: 11.2 g/dL — AB (ref 13.0–17.0)
MCH: 28 pg (ref 26.0–34.0)
MCHC: 30.9 g/dL (ref 30.0–36.0)
MCV: 90.5 fL (ref 78.0–100.0)
Platelets: 251 10*3/uL (ref 150–400)
RBC: 4 MIL/uL — AB (ref 4.22–5.81)
RDW: 15.8 % — ABNORMAL HIGH (ref 11.5–15.5)
WBC: 10.5 10*3/uL (ref 4.0–10.5)

## 2017-06-15 LAB — BASIC METABOLIC PANEL
Anion gap: 8 (ref 5–15)
BUN: 9 mg/dL (ref 6–20)
CHLORIDE: 107 mmol/L (ref 101–111)
CO2: 24 mmol/L (ref 22–32)
CREATININE: 1.35 mg/dL — AB (ref 0.61–1.24)
Calcium: 8.9 mg/dL (ref 8.9–10.3)
GFR calc non Af Amer: 58 mL/min — ABNORMAL LOW (ref >60)
Glucose, Bld: 82 mg/dL (ref 65–99)
Potassium: 4.3 mmol/L (ref 3.5–5.1)
Sodium: 139 mmol/L (ref 135–145)

## 2017-06-15 LAB — PROTIME-INR
INR: 1.22
Prothrombin Time: 15.3 seconds — ABNORMAL HIGH (ref 11.4–15.2)

## 2017-06-15 LAB — LACTATE DEHYDROGENASE: LDH: 204 U/L — AB (ref 98–192)

## 2017-06-15 MED ORDER — ENOXAPARIN SODIUM 30 MG/0.3ML ~~LOC~~ SOLN: 30.0000 mg | Freq: Two times a day (BID) | SUBCUTANEOUS | 1 refills | Status: DC

## 2017-06-15 MED ORDER — WARFARIN SODIUM 2 MG PO TABS: 2.0000 mg | ORAL_TABLET | Freq: Every day | ORAL | 5 refills | Status: DC

## 2017-06-15 MED ORDER — TRAMADOL HCL 50 MG PO TABS: 100.0000 mg | ORAL_TABLET | Freq: Three times a day (TID) | ORAL | 0 refills | Status: DC | PRN

## 2017-06-15 MED ORDER — MAGNESIUM OXIDE 400 MG PO CAPS: 400.0000 mg | ORAL_CAPSULE | Freq: Three times a day (TID) | ORAL | 6 refills | Status: DC

## 2017-06-15 NOTE — Progress Notes (Signed)
CSW met with patient and wife in the clinic. Patient and wife shared adjustments to new life with LVAD. Both appeared to have humor and tease one another about current life stressors. Patient's mother passed away over the weekend and funeral planned tentatively for the coming weekend. CSW provided supportive intervention and will continue to follow as needed. Jackie , LCSW, CCSW-MCS [phone]  

## 2017-06-15 NOTE — Progress Notes (Signed)
LVAD CLINIC FOLLOW-UP NOTE   HPI:  David [name] is a 52 y.o. male with history of ICD (09 PCI to LAD, mRCA, '10 PCI to Lcx and '12 mRCA), HFrEF (EF ~20% for >5 years), HLD and HTN. S/P LVAD HM-III 04/20/18.   Admitted 04/19/17 through 05/27/2017 for scheduled LVAD implant. On 12/27 he underwent HMIII LVAD implant. Hospital course complicated by PNA with achromobacter tracheal aspirate, shock liver, and AKI. Required short term dialysis.  Readmitted 05/30/17 with symptomatic anemia hgb 3.8.  INR 2.9. He was transfused with 4 units of packed red blood cell. 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  It was suspected AVMs were the likely cause of bleeding in the setting of anticoagulation.  He will need colonoscopy at some point, this can be done as outpatient. Discharged 06/04/17.  Follow up for Heart Failure/LVAD:  Returns today for regular follow up. Feeling much better. Denies any bleeding. No ABD pain or dizziness. Walking more every day. Denies fevers, chills, or problems with his driveline. No BRBPR or melena. Denies neuro symptoms. He denies SOB getting around the house or walking on flat ground. Surgical site pain is better controlled. He has not been able to get tramadol yet, but spoke with Pharm-D and should be able to get today (PA required)  Past Medical History:  Diagnosis Date  . AICD (automatic cardioverter/defibrillator) present   . Anemia   . Anxiety   . CAD (coronary artery disease) 2009   AMI in 12/2007 with PCI to LAD, staged PCI to  mid/distal RCA, NSTEMI in 02/2009 with BMS to LCx  . CHF (congestive heart failure) ([place])   . Chronic kidney disease 11/03/2016   stage 3 kidney disease  . Dysrhythmia    "arrythmia problems at some point", "fatal rhythms"  . History of blood transfusion    "I was bleeding from was  rectum" (04/19/2017)  . HLD (hyperlipidemia)   . HTN (hypertension)   . Ischemic cardiomyopathy    Admitted in 07/2010 with CHF exacerbation  . Myocardial infarction ([place])    "I've had 4" (04/19/2017)  . Pneumonia 2018 X 1  . Seizures ([place])    one seizure in 04/2016 during cardiac event (04/19/2017)   Current Outpatient Medications  Medication Sig Dispense Refill  . acetaminophen (TYLENOL) 325 MG tablet Take 2 tablets (650 mg total) by mouth every 4 (four) hours as needed for headache or mild pain. (Patient not taking: Reported on 06/08/2017)    . atorvastatin (LIPITOR) 40 MG tablet Take 1 tablet (40 mg total) by mouth daily at 6 PM. 30 tablet 6  . ferrous [id number] C-folic acid (TRINSICON / FOLTRIN) capsule Take 1 capsule by mouth 2 (two) times daily between meals. 60 capsule 3  . furosemide (LASIX) 40 MG tablet Take 1 tablet (40 mg total) by mouth daily. 30 tablet 2  . gabapentin (NEURONTIN) 300 MG capsule Take 1 capsule (300 mg total) by mouth daily. 30 capsule 3  . Magnesium Oxide 400 MG CAPS Take 1 capsule (400 mg total) by mouth 2 (two) times daily. 60 capsule 6  . Multiple Vitamins-Minerals (ADULT GUMMY) CHEW Chew 2 tablets by mouth daily.    . Oxycodone HCl 10 MG TABS Take 1 tablet (10 mg total) by mouth every 6 (six) hours as needed. 30 tablet 0  . pantoprazole (PROTONIX) 40 MG tablet Take 1 tablet (  40 mg total) by mouth daily. 30 tablet 6  . potassium chloride SA (K-DUR,KLOR-CON) 20 MEQ tablet Take 1 tablet (20 mEq total) by mouth daily. 30 tablet 2  . traMADol (ULTRAM) 50 MG tablet Take 2 tablets (100 mg total) by mouth every 8 (eight) hours as needed for moderate pain. 120 tablet 0  . warfarin (COUMADIN) 2 MG tablet Take 1/2 tablet (1 mg) daily except 1 tablet (2 mg) on Thursdays     No current facility-administered medications for this encounter.    Allergies  Allergen Reactions  . Plavix [Clopidogrel Bisulfate] Hives   Review of systems complete and found to be  negative unless listed in HPI.    Vital Signs:  Doppler Pressure: 88 Automatc BP: 102/91 (97) HR: 92 SPO2: 95%  Weight: 142.6 lb w/o eqt Last weight: 140 Home weights: 140 lbs   VAD Indication: Destination Therapy - CKD  VAD interrogation & Equipment Management: Speed: 5400 Flow: 4.7 Power: 4.1 w PI: 4.8  Alarms: No clinical alarms Events: Rare PI events.   Fixed speed: 5400 Low speed limit: 5100  Primary Controller: Replace back up battery in 30 months. Back up controller: Replace back up battery in 25months.  Annual Equipment Maintenance on UBC/PM was performed on 03/2017.  Denies LVAD alarms.  Denies driveline trauma, erythema or drainage.  Denies ICD shocks. Reports taking Coumadin as prescribed and adherence to anticoagulation based dietary restrictions.  Denies bright red blood per rectum or melena, no dark urine or hematuria.     I reviewed the LVAD parameters from today, and compared the results to the patient's prior recorded data.  No programming changes were made.  The LVAD is functioning within specified parameters.  The patient performs LVAD self-test daily.  LVAD interrogation was negative for any significant power changes, alarms or PI events/speed drops.  LVAD equipment check completed and is in good working order.  Back-up equipment present.   LVAD education done on emergency procedures and precautions and reviewed exit site care.   Vitals:   06/15/17 1022  BP: (!) 102/91  Pulse: 92  Resp: 16  SpO2: 95%  Weight: 142 lb 9.6 oz (64.7 kg)  Height: 5\' 5"  (1.651 m)   Physical Exam: GENERAL: Well appearing this am. NAD.  HEENT: Normal. NECK: Supple, JVP 6-7cm. Carotids OK.  CARDIAC:  Mechanical heart sounds with LVAD hum present.  LUNGS:  CTAB, normal effort.  ABDOMEN:  NT, ND, no HSM. No bruits or masses. +BS  LVAD exit site: Well-healed and incorporated. Dressing dry and intact. No erythema or drainage. Stabilization device present and  accurately applied. Driveline dressing changed daily per sterile technique. EXTREMITIES:  Warm and dry. No cyanosis, clubbing, rash, or edema.  NEUROLOGIC:  Alert & oriented x 3. Cranial nerves grossly intact. Moves all 4 extremities w/o difficulty. Affect pleasant     ASSESSMENT AND PLAN:   1. Chronic systolic CHF with HM III LVAD implant 04/20/17:  - NYHA II currently.  - Volume status stable on lasix 40 mg daily.  - Reinforced fluid restriction to < 2 L daily, sodium restriction to less than 2000 mg daily, and the importance of daily weights.   2. 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  - No further evidence of active bleeding.  - No evidence of active bleeding - Continue PPI - Off ASA. Goal INR still 2.0-2.5 3. H/O of AKI on CKD Stage III:  -  Recent admit required CVVHD-->iHD. Improved with HD catheter removed prior to discharge.  - Creatinine down to 1.35. Much improved.  4. VAD management - VAD interrogated personally. Parameters stable.  - LDH 204 - INR goal 2.0-2.5. Off ASA due to GIB 2/19. - INR 1.22  today. Discussed dosing with Pharm D personally. Unfortunately, despite recent bleed will need to be covered with Lovenox over weekend with close check on Monday, 06/19/17.  5, Chest/pocket pain - Again discussed need to limit narcotics where possible.  - Continue neurontin 300 daily - Start tramadol 100 TID prn - Last visit given oxycodone 10mg  q8hr prn for breakthrough pain only dispense #30. Does not need any more at this time.  6. PAF:  - Maintaining NSR.  7. CAD:  s/p PCI RCA/PLOM with DES x2 and DES to mild LAD 1/18 - No s/s of ischemia. Continue statin.  8. Hypomagnesemia. - Continue magnesium oxide 400 bid. Recheck level today.   Stable from HF perspective. INR low. Will use half dose Lovenox through the weekend and recheck INR first thing next week. Discussed plan above with MD and Pharm-D.  Satira Mccallum  Romani Wilbon, PA-C  10:12 AM  Greater than 50% of the 35 minute visit was spent in counseling/coordination of care regarding disease state education, salt/fluid restriction, sliding scale diuretics, and medication compliance.

## 2017-06-15 NOTE — Telephone Encounter (Signed)
-----   Message from Shirley Friar, PA-C sent at 06/15/2017 12:51 PM EST ----- Increase Mg to TID.   Thanks!   Legrand Como [address] [place], PA-C 06/15/2017 12:51 PM

## 2017-06-15 NOTE — Progress Notes (Signed)
Patient presents for 1 week  follow up in [facility] today. Reports no problems with VAD equipment or concerns with drive line.  David [name] states they have been unable to pick up the prescription for Tramadol. States the pharmacy says it requires prior approval. David [name], Pharm-D, will check into this.  Vital Signs:  Doppler Pressure 88   Automatc BP: 102/91 (97) HR:92   SPO2:unable  %  Weight: 142.6 lb w/o eqt Last weight: 140 lb Home weights: 137-142 lbs   VAD Indication: Destination Therapy- CKD    VAD interrogation & Equipment Management: Speed:5400 [name]: 4.7 Power:4.1 w    PI:4.8  Alarms: no clinical alarms Events: rare PI event  Fixed speed 5400 Low speed limit: 5100  Primary Controller:  Replace back up battery in 2months. Back up controller:   Replace back up battery in 30 months.  Annual Equipment Maintenance on UBC/PM was performed on 03/2017.   I reviewed the LVAD parameters from today and compared the results to the patient's prior recorded data. LVAD interrogation was NEGATIVE for significant power changes, NEGATIVE for clinical alarms and STABLE for PI events/speed drops. No programming changes were made and pump is functioning within specified parameters. Pt is performing daily controller and system monitor self tests along with completing weekly and monthly maintenance for LVAD equipment.  LVAD equipment check completed and is in good working order. Back-up equipment present. Charged back up battery and performed self-test on equipment.   Exit Site Care: Drive line is being maintained every other day by his wife, David [name]. Drive line exit site well healed and incorporated. The velour is fully implanted at exit site. Dressing dry and intact. No erythema or drainage. There is a large scab noted around the driveline. Stabilization device present and accurately applied. Pt denies fever or chills. Patient given 6 daily dressing kits, 4 anchors, and 1 box of  adhesive remover.  Significant Events on VAD Support:  none  Device:Medtronic Biv Therapies: on Last check: 05/30/2017  BP & Labs:  MAP 88 - Doppler is reflecting MAP  Hgb 11.2 - No S/S of bleeding. Specifically denies melena/BRBPR or nosebleeds.  LDH stable at 204 with established baseline of 170- 250. Denies tea-colored urine. No power elevations noted on interrogation.   Plan: 1. Continue twice weekly dressing changes. 2. Pick up Tramadol when ready at pharmacy. 3.No medication changes made at this time.  David Quails RN [place] Coordinator   Office: [phone] 24/7 Emergency VAD Pager: [phone]

## 2017-06-15 NOTE — Patient Instructions (Signed)
Keep up the great work!  Continue twice weekly dressings for now.

## 2017-06-15 NOTE — Telephone Encounter (Signed)
Patient aware. Patient voiced understanding  

## 2017-06-16 ENCOUNTER — Other Ambulatory Visit (HOSPITAL_COMMUNITY): Payer: Self-pay | Admitting: Cardiology

## 2017-06-19 ENCOUNTER — Other Ambulatory Visit (HOSPITAL_COMMUNITY): Payer: Self-pay | Admitting: *Deleted

## 2017-06-19 DIAGNOSIS — Z95811 Presence of heart assist device: Secondary | ICD-10-CM

## 2017-06-20 ENCOUNTER — Encounter (HOSPITAL_COMMUNITY): Payer: Self-pay

## 2017-06-20 ENCOUNTER — Ambulatory Visit (HOSPITAL_COMMUNITY): Payer: Self-pay | Admitting: Pharmacist

## 2017-06-20 ENCOUNTER — Other Ambulatory Visit (HOSPITAL_COMMUNITY): Payer: Self-pay | Admitting: Pharmacist

## 2017-06-20 ENCOUNTER — Ambulatory Visit (HOSPITAL_COMMUNITY)
Admission: RE | Admit: 2017-06-20 | Discharge: 2017-06-20 | Disposition: A | Discharge status: Home / Self Care | Payer: Medicare HMO | Source: Ambulatory Visit | Attending: Cardiology | Admitting: Cardiology

## 2017-06-20 DIAGNOSIS — Z09 Encounter for follow-up examination after completed treatment for conditions other than malignant neoplasm: Secondary | ICD-10-CM | POA: Insufficient documentation

## 2017-06-20 DIAGNOSIS — Z95811 Presence of heart assist device: Secondary | ICD-10-CM | POA: Diagnosis present

## 2017-06-20 LAB — BASIC METABOLIC PANEL
Anion gap: 9 (ref 5–15)
BUN: 8 mg/dL (ref 6–20)
CO2: 23 mmol/L (ref 22–32)
Calcium: 8.9 mg/dL (ref 8.9–10.3)
Chloride: 105 mmol/L (ref 101–111)
Creatinine, Ser: 1.37 mg/dL — ABNORMAL HIGH (ref 0.61–1.24)
GFR calc Af Amer: >60 mL/min (ref >60)
GFR, EST NON AFRICAN AMERICAN: 57 mL/min — AB (ref >60)
GLUCOSE: 94 mg/dL (ref 65–99)
POTASSIUM: 4.4 mmol/L (ref 3.5–5.1)
Sodium: 137 mmol/L (ref 135–145)

## 2017-06-20 LAB — PROTIME-INR
INR: 1.5
Prothrombin Time: 18 seconds — ABNORMAL HIGH (ref 11.4–15.2)

## 2017-06-20 LAB — CBC
HCT: 34.3 % — ABNORMAL LOW (ref 39.0–52.0)
Hemoglobin: 10.8 g/dL — ABNORMAL LOW (ref 13.0–17.0)
MCH: 28.2 pg (ref 26.0–34.0)
MCHC: 31.5 g/dL (ref 30.0–36.0)
MCV: 89.6 fL (ref 78.0–100.0)
PLATELETS: 252 10*3/uL (ref 150–400)
RBC: 3.83 MIL/uL — AB (ref 4.22–5.81)
RDW: 15.6 % — ABNORMAL HIGH (ref 11.5–15.5)
WBC: 8.5 10*3/uL (ref 4.0–10.5)

## 2017-06-20 LAB — LACTATE DEHYDROGENASE: LDH: 208 U/L — ABNORMAL HIGH (ref 98–192)

## 2017-06-20 MED ORDER — ENOXAPARIN SODIUM 30 MG/0.3ML ~~LOC~~ SOLN: 30.0000 mg | Freq: Two times a day (BID) | SUBCUTANEOUS | 1 refills | Status: DC

## 2017-06-20 MED FILL — ENOXAPARIN 30 MG/0.3 ML SYR: 30 | 5 days supply | Qty: 3 | Fill #0

## 2017-06-20 NOTE — Patient Instructions (Signed)
Keep up the great work!  Continue changing dressing twice weekly.

## 2017-06-20 NOTE — Addendum Note (Signed)
Encounter addended by: Candy Sledge, RN on: 06/20/2017 1:32 PM  Actions taken: Sign clinical note

## 2017-06-20 NOTE — Progress Notes (Signed)
The Lovenox prescription was sent and filled in the [facility].  Patient and wife were provided Lovenox prescription as written by Dr. Haroldine Laws before leaving HF VAD Clinic appointment today.

## 2017-06-20 NOTE — Progress Notes (Signed)
Patient presents for 1 week  follow up in [facility] today. Reports no problems with VAD equipment or concerns with drive line.  Vital Signs:  Doppler Pressure 82   Automatc BP: 104/54 (78) HR: 88   SPO2:98  %  Weight: 146.6 lb w/o eqt Last weight: 142.6 lb Home weights: 144.8 lbs   VAD Indication: Destination therapy-   CKD  VAD interrogation & Equipment Management: Speed:5400 Flow: 4.7 Power:4.9 w    PI:4.2  Alarms: no clinical alarms Events: 5-15 PI events daily  Fixed speed 5400 Low speed limit: 5100  Primary Controller:  Replace back up battery in 73months. Back up controller:   Replace back up battery in 30 months.  Annual Equipment Maintenance on UBC/PM was performed on 03/2017.   I reviewed the LVAD parameters from today and compared the results to the patient's prior recorded data. LVAD interrogation was NEGATIVE for significant power changes, NEGATIVE for clinical alarms and STABLE for PI events/speed drops. No programming changes were made and pump is functioning within specified parameters. Pt is performing daily controller and system monitor self tests along with completing weekly and monthly maintenance for LVAD equipment.  LVAD equipment check completed and is in good working order. Back-up equipment present. Charged back up battery and performed self-test on equipment.   Exit Site Care: Drive line is being maintained twice weekly  by [name], his wife. Drive line exit site well healed and incorporated. The velour is fully implanted at exit site. Dressing dry and intact. No erythema or drainage. Stabilization device present and accurately applied. Pt denies fever or chills. Pt states they have adequate dressing supplies at home.   Significant Events on VAD Support:  none  Device:Medtronic Bi-V Therapies: on Last check: 05/30/2017   BP & Labs:  MAP 82 - Doppler is reflecting MAP  Hgb 10.8 - No S/S of bleeding. Specifically denies  melena/BRBPR or nosebleeds.  LDH stable at 208 with established baseline of 170- 250. Denies tea-colored urine. No power elevations noted on interrogation.   Plan: 1. Continue twice weekly dressing changes using daily kits and silver strip. 2. No medication changes at this time. 3. RTC in 2 weeks  [name] RN [place] Coordinator   Office: [phone] 24/7 Emergency VAD Pager: [phone]

## 2017-06-22 ENCOUNTER — Ambulatory Visit (HOSPITAL_COMMUNITY): Payer: Self-pay | Admitting: Pharmacist

## 2017-06-22 ENCOUNTER — Inpatient Hospital Stay (HOSPITAL_COMMUNITY): Admission: RE | Admit: 2017-06-22 | Payer: Self-pay | Source: Ambulatory Visit

## 2017-06-22 DIAGNOSIS — Z95811 Presence of heart assist device: Secondary | ICD-10-CM

## 2017-06-22 LAB — POCT INR: INR: 2.1

## 2017-06-27 ENCOUNTER — Ambulatory Visit (HOSPITAL_COMMUNITY): Payer: Self-pay | Admitting: Pharmacist

## 2017-06-27 DIAGNOSIS — Z95811 Presence of heart assist device: Secondary | ICD-10-CM

## 2017-06-27 LAB — POCT INR: INR: 2.7

## 2017-06-30 ENCOUNTER — Telehealth (HOSPITAL_COMMUNITY): Payer: Self-pay | Admitting: Unknown Physician Specialty

## 2017-06-30 MED ORDER — TRAZODONE HCL 50 MG PO TABS: 50.0000 mg | ORAL_TABLET | Freq: Every day | ORAL | 3 refills | Status: DC

## 2017-06-30 NOTE — Telephone Encounter (Signed)
Received page last night and this morning with c/o that pt cannot sleep. Pt is also c/o of allergies. Pt was instructed to try benadryl tonight as this may help him to sleep and his allergies. Pt paged again this morning stating that the Benadryl did not help him to sleep. He states that his left arm is tensing up and then relaxing which is keeping him up. Will send in Trazodone 50 mg qhs per Dr. Haroldine Laws.   Tanda Rockers RN, BSN VAD Coordinator 24/7 Pager [phone]

## 2017-07-04 ENCOUNTER — Other Ambulatory Visit (HOSPITAL_COMMUNITY): Payer: Self-pay | Admitting: *Deleted

## 2017-07-04 ENCOUNTER — Encounter (HOSPITAL_COMMUNITY): Payer: Self-pay

## 2017-07-04 ENCOUNTER — Ambulatory Visit (HOSPITAL_COMMUNITY)
Admission: RE | Admit: 2017-07-04 | Discharge: 2017-07-04 | Disposition: A | Discharge status: Home / Self Care | Payer: Medicare HMO | Source: Ambulatory Visit | Attending: Cardiology | Admitting: Cardiology

## 2017-07-04 ENCOUNTER — Ambulatory Visit (HOSPITAL_COMMUNITY): Payer: Self-pay | Admitting: Pharmacist

## 2017-07-04 DIAGNOSIS — I5043 Acute on chronic combined systolic (congestive) and diastolic (congestive) heart failure: Secondary | ICD-10-CM | POA: Insufficient documentation

## 2017-07-04 DIAGNOSIS — Z95811 Presence of heart assist device: Secondary | ICD-10-CM | POA: Diagnosis not present

## 2017-07-04 DIAGNOSIS — Z7901 Long term (current) use of anticoagulants: Secondary | ICD-10-CM | POA: Diagnosis not present

## 2017-07-04 LAB — CBC
HEMATOCRIT: 36.2 % — AB (ref 39.0–52.0)
Hemoglobin: 11.3 g/dL — ABNORMAL LOW (ref 13.0–17.0)
MCH: 27.4 pg (ref 26.0–34.0)
MCHC: 31.2 g/dL (ref 30.0–36.0)
MCV: 87.9 fL (ref 78.0–100.0)
Platelets: 261 10*3/uL (ref 150–400)
RBC: 4.12 MIL/uL — ABNORMAL LOW (ref 4.22–5.81)
RDW: 15.1 % (ref 11.5–15.5)
WBC: 9.1 10*3/uL (ref 4.0–10.5)

## 2017-07-04 LAB — BASIC METABOLIC PANEL
Anion gap: 8 (ref 5–15)
BUN: 10 mg/dL (ref 6–20)
CHLORIDE: 103 mmol/L (ref 101–111)
CO2: 25 mmol/L (ref 22–32)
Calcium: 9.1 mg/dL (ref 8.9–10.3)
Creatinine, Ser: 1.39 mg/dL — ABNORMAL HIGH (ref 0.61–1.24)
GFR calc Af Amer: >60 mL/min (ref >60)
GFR calc non Af Amer: 56 mL/min — ABNORMAL LOW (ref >60)
GLUCOSE: 59 mg/dL — AB (ref 65–99)
POTASSIUM: 4 mmol/L (ref 3.5–5.1)
Sodium: 136 mmol/L (ref 135–145)

## 2017-07-04 LAB — PROTIME-INR
INR: 2.38
Prothrombin Time: 25.8 seconds — ABNORMAL HIGH (ref 11.4–15.2)

## 2017-07-04 LAB — LACTATE DEHYDROGENASE: LDH: 166 U/L (ref 98–192)

## 2017-07-04 MED ORDER — IRBESARTAN 75 MG PO TABS: 75.0000 mg | ORAL_TABLET | Freq: Every day | ORAL | 6 refills | Status: DC

## 2017-07-04 NOTE — Progress Notes (Signed)
Patient presents for 2 week  follow up in [facility] today. Reports no problems with VAD equipment or concerns with drive line.  Vital Signs:  Doppler Pressure 102   Automatc BP: 114/99 (107) HR: 97  SPO2:97 %  Weight: 150.4 lb w/o eqt Last weight: 146.6 lb Home weights: 144.8 lbs   VAD Indication: Destination therapy-   CKD  VAD interrogation & Equipment Management: Speed:5400 Flow: 4.9 Power:4.2 w    PI:2.8  Alarms: no clinical alarms Events: 5-10 PI events daily  Fixed speed 5400 Low speed limit: 5100  Primary Controller:  Replace back up battery in 29 months. Back up controller:   Replace back up battery in 29 months.  Annual Equipment Maintenance on UBC/PM was performed on 03/2017.   I reviewed the LVAD parameters from today and compared the results to the patient's prior recorded data. LVAD interrogation was NEGATIVE for significant power changes, NEGATIVE for clinical alarms and STABLE for PI events/speed drops. No programming changes were made and pump is functioning within specified parameters. Pt is performing daily controller and system monitor self tests along with completing weekly and monthly maintenance for LVAD equipment.  LVAD equipment check completed and is in good working order. Back-up equipment present. Charged back up battery and performed self-test on equipment.   Exit Site Care: Drive line is being maintained twice weekly  by [name], his wife. Dressing removed and changed using sterile technique. Drive line exit site well healed and incorporated. The velour is fully implanted at exit site. Dressing dry and intact. No erythema or drainage. There is a large scab. Stabilization device present and accurately applied. Pt denies fever or chills. Pt states they have adequate dressing supplies at home. Caregiver taught how to do weekly dressing changes using weekly kit.   Significant Events on VAD Support:  none  Device:Medtronic  Bi-V Therapies: on Last check: 05/30/2017   BP & Labs:  MAP 102 - Doppler is reflecting MAP  Hgb 11.3 - No S/S of bleeding. Specifically denies melena/BRBPR or nosebleeds.  LDH stable at 166 with established baseline of 165- 250. Denies tea-colored urine. No power elevations noted on interrogation.   Plan: 1. May start doing weekly dressing changes.  2. Start Irbesartan 75 mg daily. 3. RTC in 1 month.   Tanda Rockers RN [place] Coordinator   Office: [phone] 24/7 Emergency VAD Pager: [phone]

## 2017-07-04 NOTE — Addendum Note (Signed)
Encounter addended by: Christinia Gully, RN on: 07/04/2017 1:59 PM  Actions taken: Order Reconciliation Section accessed, Pharmacy for encounter modified, Order list changed, Sign clinical note

## 2017-07-04 NOTE — Addendum Note (Signed)
Encounter addended by: Christinia Gully, RN on: 07/04/2017 1:33 PM  Actions taken: Vitals modified, Order Reconciliation Section accessed, Home Medications modified, Medication List reviewed, Medication taking status modified

## 2017-07-05 ENCOUNTER — Telehealth (HOSPITAL_COMMUNITY): Payer: Self-pay | Admitting: *Deleted

## 2017-07-05 ENCOUNTER — Other Ambulatory Visit (HOSPITAL_COMMUNITY): Payer: Self-pay | Admitting: *Deleted

## 2017-07-05 DIAGNOSIS — Z95811 Presence of heart assist device: Secondary | ICD-10-CM

## 2017-07-05 DIAGNOSIS — I10 Essential (primary) hypertension: Secondary | ICD-10-CM

## 2017-07-05 DIAGNOSIS — I5043 Acute on chronic combined systolic (congestive) and diastolic (congestive) heart failure: Secondary | ICD-10-CM

## 2017-07-05 MED ORDER — LOSARTAN POTASSIUM 25 MG PO TABS: 25.0000 mg | ORAL_TABLET | Freq: Every day | ORAL | 3 refills | Status: DC

## 2017-07-05 NOTE — Telephone Encounter (Signed)
Pt's wife called VAD pager yesterday evening to report new Rx - irbesartan was on "back order" at local pharmacy and wouldn't be available for 48 hrs.   I contacted [employer] today per Vernard Gambles, PharmD and confirmed med was on back order until May of this year. Confirmed they have Losartan 25 mg (that is not recalled) available and in stock. Will change medication.   Called pt's wife and informed her of changes. She will pick up Losartan later today and get patient started on daily dose.

## 2017-07-11 ENCOUNTER — Ambulatory Visit (HOSPITAL_COMMUNITY): Payer: Self-pay | Admitting: Pharmacist

## 2017-07-11 DIAGNOSIS — Z95811 Presence of heart assist device: Secondary | ICD-10-CM

## 2017-07-11 LAB — POCT INR: INR: 1.9

## 2017-07-18 ENCOUNTER — Ambulatory Visit (HOSPITAL_COMMUNITY): Payer: Self-pay | Admitting: Pharmacist

## 2017-07-18 DIAGNOSIS — Z95811 Presence of heart assist device: Secondary | ICD-10-CM

## 2017-07-18 LAB — POCT INR: INR: 2.5

## 2017-07-20 ENCOUNTER — Ambulatory Visit: Payer: Self-pay | Admitting: Gastroenterology

## 2017-07-25 ENCOUNTER — Ambulatory Visit (HOSPITAL_COMMUNITY): Payer: Self-pay | Admitting: Pharmacist

## 2017-07-25 DIAGNOSIS — Z95811 Presence of heart assist device: Secondary | ICD-10-CM

## 2017-07-25 LAB — POCT INR: INR: 1.9

## 2017-08-03 ENCOUNTER — Other Ambulatory Visit (HOSPITAL_COMMUNITY): Payer: Self-pay | Admitting: Unknown Physician Specialty

## 2017-08-03 ENCOUNTER — Encounter (HOSPITAL_COMMUNITY): Payer: Self-pay

## 2017-08-03 ENCOUNTER — Ambulatory Visit (HOSPITAL_COMMUNITY)
Admission: RE | Admit: 2017-08-03 | Discharge: 2017-08-03 | Disposition: A | Discharge status: Home / Self Care | Payer: Medicare HMO | Source: Ambulatory Visit | Attending: Internal Medicine | Admitting: Internal Medicine

## 2017-08-03 ENCOUNTER — Ambulatory Visit (HOSPITAL_COMMUNITY): Payer: Self-pay | Admitting: Pharmacist

## 2017-08-03 VITALS — BP 94/0 | HR 100 | Ht 65.0 in | Wt 149.8 lb

## 2017-08-03 DIAGNOSIS — I2510 Atherosclerotic heart disease of native coronary artery without angina pectoris: Secondary | ICD-10-CM | POA: Diagnosis not present

## 2017-08-03 DIAGNOSIS — D649 Anemia, unspecified: Secondary | ICD-10-CM | POA: Insufficient documentation

## 2017-08-03 DIAGNOSIS — Z79899 Other long term (current) drug therapy: Secondary | ICD-10-CM | POA: Insufficient documentation

## 2017-08-03 DIAGNOSIS — Z95811 Presence of heart assist device: Secondary | ICD-10-CM | POA: Diagnosis not present

## 2017-08-03 DIAGNOSIS — K922 Gastrointestinal hemorrhage, unspecified: Secondary | ICD-10-CM

## 2017-08-03 DIAGNOSIS — I10 Essential (primary) hypertension: Secondary | ICD-10-CM | POA: Diagnosis not present

## 2017-08-03 DIAGNOSIS — N183 Chronic kidney disease, stage 3 (moderate): Secondary | ICD-10-CM | POA: Insufficient documentation

## 2017-08-03 DIAGNOSIS — I255 Ischemic cardiomyopathy: Secondary | ICD-10-CM | POA: Diagnosis not present

## 2017-08-03 DIAGNOSIS — I252 Old myocardial infarction: Secondary | ICD-10-CM | POA: Diagnosis not present

## 2017-08-03 DIAGNOSIS — E785 Hyperlipidemia, unspecified: Secondary | ICD-10-CM | POA: Insufficient documentation

## 2017-08-03 DIAGNOSIS — Z79891 Long term (current) use of opiate analgesic: Secondary | ICD-10-CM | POA: Diagnosis not present

## 2017-08-03 DIAGNOSIS — I480 Paroxysmal atrial fibrillation: Secondary | ICD-10-CM | POA: Diagnosis not present

## 2017-08-03 DIAGNOSIS — Z7901 Long term (current) use of anticoagulants: Secondary | ICD-10-CM | POA: Insufficient documentation

## 2017-08-03 DIAGNOSIS — I130 Hypertensive heart and chronic kidney disease with heart failure and stage 1 through stage 4 chronic kidney disease, or unspecified chronic kidney disease: Secondary | ICD-10-CM | POA: Diagnosis not present

## 2017-08-03 DIAGNOSIS — F419 Anxiety disorder, unspecified: Secondary | ICD-10-CM | POA: Insufficient documentation

## 2017-08-03 DIAGNOSIS — I5022 Chronic systolic (congestive) heart failure: Secondary | ICD-10-CM | POA: Diagnosis not present

## 2017-08-03 DIAGNOSIS — Z95810 Presence of automatic (implantable) cardiac defibrillator: Secondary | ICD-10-CM | POA: Diagnosis not present

## 2017-08-03 LAB — CBC
HCT: 44.5 % (ref 39.0–52.0)
HEMOGLOBIN: 14.1 g/dL (ref 13.0–17.0)
MCH: 27 pg (ref 26.0–34.0)
MCHC: 31.7 g/dL (ref 30.0–36.0)
MCV: 85.2 fL (ref 78.0–100.0)
PLATELETS: 265 10*3/uL (ref 150–400)
RBC: 5.22 MIL/uL (ref 4.22–5.81)
RDW: 14.7 % (ref 11.5–15.5)
WBC: 8.9 10*3/uL (ref 4.0–10.5)

## 2017-08-03 LAB — COMPREHENSIVE METABOLIC PANEL
ALK PHOS: 112 U/L (ref 38–126)
ALT: 17 U/L (ref 17–63)
ANION GAP: 11 (ref 5–15)
AST: 25 U/L (ref 15–41)
Albumin: 3.1 g/dL — ABNORMAL LOW (ref 3.5–5.0)
BUN: 11 mg/dL (ref 6–20)
CALCIUM: 9.7 mg/dL (ref 8.9–10.3)
CO2: 24 mmol/L (ref 22–32)
CREATININE: 1.4 mg/dL — AB (ref 0.61–1.24)
Chloride: 104 mmol/L (ref 101–111)
GFR, EST NON AFRICAN AMERICAN: 56 mL/min — AB (ref >60)
Glucose, Bld: 89 mg/dL (ref 65–99)
Potassium: 4.5 mmol/L (ref 3.5–5.1)
SODIUM: 139 mmol/L (ref 135–145)
Total Bilirubin: 0.7 mg/dL (ref 0.3–1.2)
Total Protein: 9 g/dL — ABNORMAL HIGH (ref 6.5–8.1)

## 2017-08-03 LAB — MAGNESIUM: MAGNESIUM: 1.8 mg/dL (ref 1.7–2.4)

## 2017-08-03 LAB — LACTATE DEHYDROGENASE: LDH: 191 U/L (ref 98–192)

## 2017-08-03 LAB — PROTIME-INR
INR: 2.42
Prothrombin Time: 26.1 seconds — ABNORMAL HIGH (ref 11.4–15.2)

## 2017-08-03 LAB — PREALBUMIN: Prealbumin: 31.7 mg/dL (ref 18–38)

## 2017-08-03 MED ORDER — TRAMADOL HCL 50 MG PO TABS: 100.0000 mg | ORAL_TABLET | Freq: Three times a day (TID) | ORAL | 0 refills | Status: DC | PRN

## 2017-08-03 NOTE — Progress Notes (Signed)
Patient presents for 1 month follow up in [facility] today. Reports no problems with VAD equipment or concerns with drive line.  Vital Signs:  Doppler Pressure 94  Automatc BP: 110/70 (87) HR: 100  SPO2: not able to obtain %  Weight: 149.8 lb w/oeqt Last weight: 150.4 lb Home weights: 144.8 lbs   VAD Indication: Destination therapy-   CKD  VAD interrogation & Equipment Management: Speed: 5400 Flow: 4.2 Power:4.3 w    PI: 7.4  Alarms: no clinical alarms Events: 10-15 PI events daily  Fixed speed 5400 Low speed limit: 5100  Primary Controller:  Replace back up battery in 28 months. Back up controller:   Replace back up battery in 28 months.  Annual Equipment Maintenance on UBC/PM was performed on 03/2017.   I reviewed the LVAD parameters from today and compared the results to the patient's prior recorded data. LVAD interrogation was NEGATIVE for significant power changes, NEGATIVE for clinical alarms and STABLE for PI events/speed drops. No programming changes were made and pump is functioning within specified parameters. Pt is performing daily controller and system monitor self tests along with completing weekly and monthly maintenance for LVAD equipment.  LVAD equipment check completed and is in good working order. Back-up equipment present. Charged back up battery and performed self-test on equipment.   Exit Site Care: Drive line is being maintained twice weekly  by [name], his wife. Drive line exit site well healed and incorporated. The velour is fully implanted at exit site. Dressing dry and intact. No erythema or drainage. Stabilization device present and accurately applied. Pt denies fever or chills. Pt given 8 weekly dressings and 8 anchors at this visit.   Significant Events on VAD Support:  none  Device:Medtronic Bi-V Therapies: on Last check: 05/30/2017   BP & Labs:  MAP 94 - Doppler is reflecting MAP  Hgb 14.1 - No S/S of bleeding. Specifically  denies melena/BRBPR or nosebleeds.  LDH stable at 166 with established baseline of 165- 250. Denies tea-colored urine. No power elevations noted on interrogation.   3 mo Intermacs follow up completed including:  Quality of Life, KCCQ-12, and Neurocognitive trail making (2 min).   Pt completed 1380 feet during 6 minute walk.   Plan: 1. No changes in medications today. 2. Refill script provided today for Tramadol. 3. Return to clinic in 2 months.   Tanda Rockers RN [place] Coordinator   Office: [phone] 24/7 Emergency VAD Pager: [phone]

## 2017-08-05 NOTE — Progress Notes (Signed)
LVAD CLINIC FOLLOW-UP NOTE   HPI:  Mr Emily is a 52 year old with history of ICD (09 PCI to LAD, mRCA, '10 PCI to Lcx and '12 mRCA), HFrEF (EF ~20% for >5 years), HLD and HTN. S/P LVAD HM-III 04/20/18.   Admitted 04/19/17 through 05/27/2017 for scheduled LVAD implant. On 12/27 he underwent HMIII LVAD implant. Hospital course complicated by PNA with achromobacter tracheal aspirate, shock liver, and AKI. Required short term dialysis.  Readmitted 05/30/17 with symptomatic anemia hgb 3.8.  INR 2.9. He was transfused with 4 units of packed red blood cell. 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  It was suspected AVMs were the likely cause of bleeding in the setting of anticoagulation.  He will need colonoscopy at some point, this can be done as outpatient. Discharged 06/04/17.  Follow up for Heart Failure/LVAD:  Returns today for routine f/u. Here with his wife. Now 3 months out. Says he feels great. Has more energy than he has had in several years. Able to do all activities without limitation. Still with occasional pocket pain but resolves quickly with one dose of tramadol. Walked 1380 feet with 6MW today.Denies orthopnea or PND. No fevers, chills or problems with driveline. No bleeding, melena or neuro symptoms. No VAD alarms. Taking all meds as prescribed.   VAD Indication: Destination therapy-  transplant  limited by CKD  VAD interrogation & Equipment Management: Speed: 5400 Flow: 4.2 Power:4.3 w PI: 7.4  Alarms: no clinical alarms Events: 10-15 PI events daily  Fixed speed 5400 Low speed limit: 5100  Primary Controller: Replace back up battery in 28 months. Back up controller: Replace back up battery in 26months.  Annual Equipment Maintenance on UBC/PM was performed on 03/2017.   Denies LVAD alarms.  Denies  driveline trauma, erythema or drainage.  Denies ICD shocks.   Reports taking Coumadin as prescribed and adherence to anticoagulation based dietary restrictions.  Denies bright red blood per rectum or melena, no dark urine or hematuria.     Past Medical History:  Diagnosis Date  . AICD (automatic cardioverter/defibrillator) present   . Anemia   . Anxiety   . CAD (coronary artery disease) 2009   AMI in 12/2007 with PCI to LAD, staged PCI to  mid/distal RCA, NSTEMI in 02/2009 with BMS to LCx  . CHF (congestive heart failure) ([place])   . Chronic kidney disease 11/03/2016   stage 3 kidney disease  . Dysrhythmia    "arrythmia problems at some point", "fatal rhythms"  . History of blood transfusion    "I was bleeding from was rectum" (04/19/2017)  . HLD (hyperlipidemia)   . HTN (hypertension)   . Ischemic cardiomyopathy    Admitted in 07/2010 with CHF exacerbation  . Myocardial infarction ([place])    "I've had 4" (04/19/2017)  . Pneumonia 2018 X 1  . Seizures ([place])    one seizure in 04/2016 during cardiac event (04/19/2017)    Current Outpatient Medications  Medication Sig Dispense Refill  . acetaminophen (TYLENOL) 325 MG tablet Take 2 tablets (650 mg total) by mouth every 4 (four) hours as needed for headache or mild pain.    Marland Kitchen atorvastatin (LIPITOR) 40 MG tablet Take 1 tablet (40 mg total) by mouth daily at 6 PM. 30 tablet 6  . ferrous [id number] C-folic acid (TRINSICON / FOLTRIN) capsule Take 1 capsule by mouth 2 (two)  times daily between meals. 60 capsule 3  . furosemide (LASIX) 40 MG tablet Take 1 tablet (40 mg total) by mouth daily. 30 tablet 2  . gabapentin (NEURONTIN) 300 MG capsule Take 1 capsule (300 mg total) by mouth daily. 30 capsule 3  . losartan (COZAAR) 25 MG tablet Take 1 tablet (25 mg total) by mouth daily. 30 tablet 3  . Magnesium Oxide 400 MG CAPS Take 1 capsule (400 mg total) by mouth 3 (three) times daily. 90 capsule 6  . pantoprazole (PROTONIX) 40 MG tablet  Take 1 tablet (40 mg total) by mouth daily. 30 tablet 6  . potassium chloride SA (K-DUR,KLOR-CON) 20 MEQ tablet Take 1 tablet (20 mEq total) by mouth daily. 30 tablet 2  . traMADol (ULTRAM) 50 MG tablet Take 2 tablets (100 mg total) by mouth every 8 (eight) hours as needed for moderate pain. 120 tablet 0  . traZODone (DESYREL) 50 MG tablet Take 1 tablet (50 mg total) by mouth at bedtime. 30 tablet 3  . warfarin (COUMADIN) 2 MG tablet Take 2 mg by mouth daily.     . Multiple Vitamins-Minerals (ADULT GUMMY) CHEW Chew 2 tablets by mouth daily.     No current facility-administered medications for this encounter.     Plavix [clopidogrel bisulfate]  REVIEW OF SYSTEMS: All systems negative except as listed in HPI, PMH and Problem list.   Vitals:   08/03/17 1106 08/03/17 1109  BP: 110/70 (!) 94/0  Pulse: 100   Weight: 149 lb 12.8 oz (67.9 kg)   Height: 5\' 5"  (1.651 m)    Vital Signs:  Doppler Pressure 94    Automatc BP: 110/70 (87) HR: 100 SPO2: not able to obtain %  Weight: 149.8 lb w/oeqt Last weight: 150.4 lb Home weights: 144.8 lbs    Physical Exam: General:  NAD.  HEENT: normal  Neck: supple. JVP not elevated.  Carotids 2+ bilat; no bruits. No lymphadenopathy or thryomegaly appreciated. Cor: LVAD hum.  Lungs: Clear. Abdomen: soft, nontender, non-distended. No hepatosplenomegaly. No bruits or masses. Good bowel sounds. Driveline site clean. Anchor in place.  Extremities: no cyanosis, clubbing, rash. Warm no edema  Neuro: alert & oriented x 3. No focal deficits. Moves all 4 without problem    ASSESSMENT AND PLAN:   1. Chronic systolic CHF with HM III LVAD implant 04/20/17:  - Doing extremely well 3.5 months post VAD implant. NYHA I>  - Volume status looks good. - Continue lasix 40 daily. Can cut back as needed.  2. 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  Goal INR still 2.0-2.5 3. H/O of AKI on CKD Stage III:  - Creatinine stable at 1.4 4. VAD management - VAD interrogated personally. Parameters stable. - LDH 191 - INR goal 2.0-2.5 Off ASA due to GIB 2/19. INR 2.4  Discussed dosing with PharmD personally. - MAP improved on losartan 5, Chest/pocket pain -continue neurontin 300 daily -will refill tramadol to use prn only  6. PAF:  -Maintaining NSR.  7. CAD:  s/p PCI RCA/PLOM with DES x2 and DES to mild LAD 1/18 - No evidence of angina. Continue statin. Off ASA due to GIB 8. Hypomagnesemia. - Continue magnesium oxide 400 bid   Total time spent 35 minutes. Over half that time spent discussing above.   Glori Bickers, MD  12:38 PM  .

## 2017-08-14 ENCOUNTER — Ambulatory Visit (HOSPITAL_COMMUNITY): Payer: Self-pay | Admitting: Pharmacist

## 2017-08-14 ENCOUNTER — Other Ambulatory Visit (HOSPITAL_COMMUNITY): Payer: Self-pay | Admitting: *Deleted

## 2017-08-14 DIAGNOSIS — I5043 Acute on chronic combined systolic (congestive) and diastolic (congestive) heart failure: Secondary | ICD-10-CM

## 2017-08-14 DIAGNOSIS — Z95811 Presence of heart assist device: Secondary | ICD-10-CM

## 2017-08-14 DIAGNOSIS — I10 Essential (primary) hypertension: Secondary | ICD-10-CM

## 2017-08-14 LAB — POCT INR: INR: 1.9

## 2017-08-14 MED ORDER — LOSARTAN POTASSIUM 25 MG PO TABS: 25.0000 mg | ORAL_TABLET | Freq: Every day | ORAL | 3 refills | Status: DC

## 2017-08-21 ENCOUNTER — Ambulatory Visit (HOSPITAL_COMMUNITY): Payer: Self-pay | Admitting: Pharmacist

## 2017-08-21 ENCOUNTER — Other Ambulatory Visit: Payer: Self-pay | Admitting: Physician Assistant

## 2017-08-21 DIAGNOSIS — Z95811 Presence of heart assist device: Secondary | ICD-10-CM

## 2017-08-21 LAB — POCT INR: INR: 1.8

## 2017-08-21 NOTE — Telephone Encounter (Signed)
REFILL 

## 2017-08-28 ENCOUNTER — Ambulatory Visit (HOSPITAL_COMMUNITY): Payer: Self-pay | Admitting: Pharmacist

## 2017-08-28 DIAGNOSIS — Z95811 Presence of heart assist device: Secondary | ICD-10-CM

## 2017-08-28 LAB — POCT INR: INR: 1.8

## 2017-08-29 ENCOUNTER — Ambulatory Visit: Payer: Self-pay | Admitting: Gastroenterology

## 2017-09-04 ENCOUNTER — Ambulatory Visit (HOSPITAL_COMMUNITY): Payer: Self-pay | Admitting: Pharmacist

## 2017-09-04 DIAGNOSIS — Z95811 Presence of heart assist device: Secondary | ICD-10-CM

## 2017-09-04 LAB — POCT INR: INR: 2.1

## 2017-09-11 ENCOUNTER — Ambulatory Visit (HOSPITAL_COMMUNITY): Payer: Self-pay | Admitting: Pharmacist

## 2017-09-11 LAB — POCT INR: INR: 2.4 (ref 2–3)

## 2017-09-19 ENCOUNTER — Ambulatory Visit (HOSPITAL_COMMUNITY): Payer: Self-pay | Admitting: Pharmacist

## 2017-09-19 DIAGNOSIS — Z95811 Presence of heart assist device: Secondary | ICD-10-CM

## 2017-09-19 LAB — POCT INR: INR: 2.3 (ref 2–3)

## 2017-09-25 ENCOUNTER — Ambulatory Visit (HOSPITAL_COMMUNITY): Payer: Self-pay | Admitting: Pharmacist

## 2017-09-25 DIAGNOSIS — Z95811 Presence of heart assist device: Secondary | ICD-10-CM

## 2017-09-25 LAB — POCT INR: INR: 3.2 — AB (ref 2.0–3.0)

## 2017-10-02 ENCOUNTER — Ambulatory Visit (HOSPITAL_COMMUNITY): Payer: Self-pay | Admitting: Pharmacist

## 2017-10-02 DIAGNOSIS — Z95811 Presence of heart assist device: Secondary | ICD-10-CM

## 2017-10-02 LAB — POCT INR: INR: 2.6 (ref 2.0–3.0)

## 2017-10-03 ENCOUNTER — Encounter (HOSPITAL_COMMUNITY): Payer: Self-pay

## 2017-10-03 ENCOUNTER — Ambulatory Visit (HOSPITAL_COMMUNITY)
Admission: RE | Admit: 2017-10-03 | Discharge: 2017-10-03 | Disposition: A | Discharge status: Home / Self Care | Payer: Medicare HMO | Source: Ambulatory Visit | Attending: Cardiology | Admitting: Cardiology

## 2017-10-03 ENCOUNTER — Other Ambulatory Visit (HOSPITAL_COMMUNITY): Payer: Self-pay | Admitting: *Deleted

## 2017-10-03 DIAGNOSIS — Z95811 Presence of heart assist device: Secondary | ICD-10-CM

## 2017-10-03 DIAGNOSIS — Z7901 Long term (current) use of anticoagulants: Secondary | ICD-10-CM

## 2017-10-03 LAB — LACTATE DEHYDROGENASE: LDH: 212 U/L — AB (ref 98–192)

## 2017-10-03 LAB — BASIC METABOLIC PANEL
Anion gap: 8 (ref 5–15)
BUN: 10 mg/dL (ref 6–20)
CALCIUM: 9.6 mg/dL (ref 8.9–10.3)
CO2: 27 mmol/L (ref 22–32)
Chloride: 107 mmol/L (ref 101–111)
Creatinine, Ser: 1.54 mg/dL — ABNORMAL HIGH (ref 0.61–1.24)
GFR, EST AFRICAN AMERICAN: 58 mL/min — AB (ref >60)
GFR, EST NON AFRICAN AMERICAN: 50 mL/min — AB (ref >60)
Glucose, Bld: 100 mg/dL — ABNORMAL HIGH (ref 65–99)
Potassium: 4.4 mmol/L (ref 3.5–5.1)
SODIUM: 142 mmol/L (ref 135–145)

## 2017-10-03 LAB — CBC
HCT: 50 % (ref 39.0–52.0)
Hemoglobin: 15.2 g/dL (ref 13.0–17.0)
MCH: 25.2 pg — AB (ref 26.0–34.0)
MCHC: 30.4 g/dL (ref 30.0–36.0)
MCV: 83.1 fL (ref 78.0–100.0)
PLATELETS: 201 10*3/uL (ref 150–400)
RBC: 6.02 MIL/uL — AB (ref 4.22–5.81)
RDW: 14.7 % (ref 11.5–15.5)
WBC: 7.8 10*3/uL (ref 4.0–10.5)

## 2017-10-03 LAB — MAGNESIUM: MAGNESIUM: 1.9 mg/dL (ref 1.7–2.4)

## 2017-10-03 NOTE — Progress Notes (Signed)
CSW met with patient and wife in the clinic. Patient reports he is doing well and denies any concerns. Patient's wife was somber at first but after some discussion of support group and meeting other caregivers she brighten up. Patient and wife plan to attend LVAD support group next Monday. CSW provided supportive intervention and will continue to follow through VAD clinic. Raquel Sarna, [place], [place]

## 2017-10-03 NOTE — Progress Notes (Signed)
Patient presents for 2 month follow up in [facility] today. Reports no problems with VAD equipment or concerns with drive line.  Pt reports he is feeling "great". Walking 10 mins daily at fast pace, wife won't let him increase activity because she worries he will "over do".   Pt denies any symptoms of fluid overload - says he usually feels abdominal distention, hasn't had this since last visit. He is taking Lasix 40 mg daily.  He is requesting refill on Tramadol, using primarily for back pain. Per Oda Kilts, PA - will fill one more time, but pt/wife told he must get PCP for chronic pain management. Both verbalized understanding of same.   Vital Signs:  Doppler Pressure 90  Automatc BP: 106/67 (79) HR: 72 SPO2: UTO  Weight: 160 lb w/oeqt Last weight: 149.8 lb   VAD Indication: Destination therapy-   CKD  VAD interrogation & Equipment Management: Speed: 5400 Flow: 4.8 Power:4.1 w    PI: 4.0  Alarms: no clinical alarms Events: 10-20 PI events daily  Fixed speed 5400 Low speed limit: 5100  Primary Controller:  Replace back up battery in 26 months. Back up controller:   Replace back up battery in 24 months.  Annual Equipment Maintenance on UBC/PM was performed on 03/2017.   I reviewed the LVAD parameters from today and compared the results to the patient's prior recorded data. LVAD interrogation was NEGATIVE for significant power changes, NEGATIVE for clinical alarms and STABLE for PI events/speed drops. No programming changes were made and pump is functioning within specified parameters. Pt is performing daily controller and system monitor self tests along with completing weekly and monthly maintenance for LVAD equipment.  LVAD equipment check completed and is in good working order. Back-up equipment present. Charged back up battery and performed self-test on equipment.   Exit Site Care: Drive line is being maintained weekly  by [name], his wife. Existing VAD  dressing removed and site care performed using sterile technique. Drive line exit site cleaned with Chlora prep applicators x 2, allowed to dry, and Sorbaview dressing with bio patch re-applied. Exit site healed and incorporated, the velour is fully implanted at exit site. No redness, tenderness, drainage, foul odor or rash noted. Drive line anchor re-applied. Pt denies fever or chills. Supplied with 10 weekly dressing kits and 4 extra anchors.   Significant Events on VAD Support:  none  Device:Medtronic Bi-V Therapies: on Last check: 05/30/2017   BP & Labs:  MAP 90 - Doppler is reflecting MAP  Hgb 15.2 - No S/S of bleeding. Specifically denies melena/BRBPR or nosebleeds.  LDH stable at 212 with established baseline of 165- 250. Denies tea-colored urine. No power elevations noted on interrogation.   6 mo Intermacs follow up completed including:  Quality of Life, KCCQ-12, and Neurocognitive trail making (2 min).   Pt completed 1500 feet during 6 minute walk.   Plan: 1. Decrease Lasix to 40 mg alternating with 20 mg daily. May take whole Lasix as needed if fluid retention occurs.  2. Refill for Tramadol called to local [facility].  3. Return to clinic in 2 months.   Zada Girt RN [place] Coordinator   Office: [phone] 24/7 Emergency VAD Pager: [phone]

## 2017-10-09 ENCOUNTER — Ambulatory Visit (HOSPITAL_COMMUNITY): Payer: Self-pay | Admitting: Pharmacist

## 2017-10-09 DIAGNOSIS — Z95811 Presence of heart assist device: Secondary | ICD-10-CM

## 2017-10-09 LAB — POCT INR: INR: 2.5 (ref 2–3)

## 2017-10-16 ENCOUNTER — Ambulatory Visit (HOSPITAL_COMMUNITY): Payer: Self-pay | Admitting: Pharmacist

## 2017-10-16 DIAGNOSIS — Z95811 Presence of heart assist device: Secondary | ICD-10-CM

## 2017-10-16 LAB — POCT INR: INR: 2 (ref 2.0–3.0)

## 2017-10-17 ENCOUNTER — Other Ambulatory Visit (HOSPITAL_COMMUNITY): Payer: Self-pay | Admitting: Unknown Physician Specialty

## 2017-10-17 MED ORDER — GABAPENTIN 300 MG PO CAPS: 300.0000 mg | ORAL_CAPSULE | Freq: Every day | ORAL | 3 refills | Status: DC

## 2017-10-23 ENCOUNTER — Ambulatory Visit (HOSPITAL_COMMUNITY): Payer: Self-pay | Admitting: Pharmacist

## 2017-10-23 DIAGNOSIS — Z95811 Presence of heart assist device: Secondary | ICD-10-CM

## 2017-10-23 LAB — POCT INR: INR: 2.3 (ref 2–3)

## 2017-10-25 ENCOUNTER — Other Ambulatory Visit (HOSPITAL_COMMUNITY): Payer: Self-pay | Admitting: Student

## 2017-10-30 ENCOUNTER — Ambulatory Visit (HOSPITAL_COMMUNITY): Payer: Self-pay | Admitting: Pharmacist

## 2017-10-30 DIAGNOSIS — Z95811 Presence of heart assist device: Secondary | ICD-10-CM

## 2017-10-30 LAB — POCT INR: INR: 2 (ref 2–3)

## 2017-11-06 ENCOUNTER — Ambulatory Visit (HOSPITAL_COMMUNITY): Payer: Self-pay | Admitting: Pharmacist

## 2017-11-06 DIAGNOSIS — Z95811 Presence of heart assist device: Secondary | ICD-10-CM

## 2017-11-06 LAB — POCT INR: INR: 2.2 (ref 2.0–3.0)

## 2017-11-13 ENCOUNTER — Ambulatory Visit (HOSPITAL_COMMUNITY): Payer: Self-pay | Admitting: Pharmacist

## 2017-11-13 DIAGNOSIS — Z95811 Presence of heart assist device: Secondary | ICD-10-CM

## 2017-11-13 LAB — POCT INR: INR: 2.1 (ref 2.0–3.0)

## 2017-11-20 ENCOUNTER — Ambulatory Visit (HOSPITAL_COMMUNITY): Payer: Self-pay | Admitting: Pharmacist

## 2017-11-20 DIAGNOSIS — Z95811 Presence of heart assist device: Secondary | ICD-10-CM

## 2017-11-20 LAB — POCT INR: INR: 1.8 — AB (ref 2.0–3.0)

## 2017-11-24 ENCOUNTER — Other Ambulatory Visit (HOSPITAL_COMMUNITY): Payer: Self-pay | Admitting: Student

## 2017-11-24 ENCOUNTER — Other Ambulatory Visit: Payer: Self-pay | Admitting: Physician Assistant

## 2017-11-24 ENCOUNTER — Other Ambulatory Visit: Payer: Self-pay | Admitting: *Deleted

## 2017-11-24 NOTE — Telephone Encounter (Signed)
Dr. Haroldine Laws is the primary Cardiologist. Please address

## 2017-11-27 ENCOUNTER — Encounter: Payer: Self-pay | Admitting: Internal Medicine

## 2017-11-27 ENCOUNTER — Ambulatory Visit (HOSPITAL_COMMUNITY): Payer: Self-pay | Admitting: Pharmacist

## 2017-11-27 DIAGNOSIS — Z95811 Presence of heart assist device: Secondary | ICD-10-CM

## 2017-11-27 LAB — POCT INR: INR: 1.8 — AB (ref 2–3)

## 2017-12-03 ENCOUNTER — Other Ambulatory Visit (HOSPITAL_COMMUNITY): Payer: Self-pay | Admitting: *Deleted

## 2017-12-03 DIAGNOSIS — I5043 Acute on chronic combined systolic (congestive) and diastolic (congestive) heart failure: Secondary | ICD-10-CM

## 2017-12-03 MED ORDER — POTASSIUM CHLORIDE CRYS ER 20 MEQ PO TBCR: 20.0000 meq | EXTENDED_RELEASE_TABLET | Freq: Every day | ORAL | 3 refills | Status: DC

## 2017-12-03 MED ORDER — FUROSEMIDE 40 MG PO TABS: 40.0000 mg | ORAL_TABLET | Freq: Every day | ORAL | 3 refills | Status: DC

## 2017-12-03 MED ORDER — FUROSEMIDE 40 MG PO TABS: 40.0000 mg | ORAL_TABLET | Freq: Every day | ORAL | 0 refills | Status: DC

## 2017-12-03 MED ORDER — POTASSIUM CHLORIDE CRYS ER 20 MEQ PO TBCR: 20.0000 meq | EXTENDED_RELEASE_TABLET | Freq: Every day | ORAL | 0 refills | Status: DC

## 2017-12-03 NOTE — Addendum Note (Signed)
Addended by: Zada Girt B on: 12/03/2017 01:36 PM   Modules accepted: Orders

## 2017-12-04 ENCOUNTER — Other Ambulatory Visit (HOSPITAL_COMMUNITY): Payer: Self-pay | Admitting: Unknown Physician Specialty

## 2017-12-04 ENCOUNTER — Ambulatory Visit (HOSPITAL_COMMUNITY)
Admission: RE | Admit: 2017-12-04 | Discharge: 2017-12-04 | Disposition: A | Discharge status: Home / Self Care | Payer: Medicare HMO | Source: Ambulatory Visit | Attending: Cardiology | Admitting: Cardiology

## 2017-12-04 ENCOUNTER — Other Ambulatory Visit (HOSPITAL_COMMUNITY): Payer: Self-pay | Admitting: Pharmacist

## 2017-12-04 ENCOUNTER — Ambulatory Visit (HOSPITAL_COMMUNITY): Payer: Self-pay | Admitting: Pharmacist

## 2017-12-04 ENCOUNTER — Encounter (HOSPITAL_COMMUNITY): Payer: Self-pay

## 2017-12-04 DIAGNOSIS — D649 Anemia, unspecified: Secondary | ICD-10-CM | POA: Diagnosis not present

## 2017-12-04 DIAGNOSIS — I252 Old myocardial infarction: Secondary | ICD-10-CM | POA: Diagnosis not present

## 2017-12-04 DIAGNOSIS — I5043 Acute on chronic combined systolic (congestive) and diastolic (congestive) heart failure: Secondary | ICD-10-CM

## 2017-12-04 DIAGNOSIS — I10 Essential (primary) hypertension: Secondary | ICD-10-CM

## 2017-12-04 DIAGNOSIS — I5020 Unspecified systolic (congestive) heart failure: Secondary | ICD-10-CM

## 2017-12-04 DIAGNOSIS — Z955 Presence of coronary angioplasty implant and graft: Secondary | ICD-10-CM | POA: Insufficient documentation

## 2017-12-04 DIAGNOSIS — E785 Hyperlipidemia, unspecified: Secondary | ICD-10-CM | POA: Diagnosis not present

## 2017-12-04 DIAGNOSIS — I2510 Atherosclerotic heart disease of native coronary artery without angina pectoris: Secondary | ICD-10-CM | POA: Diagnosis not present

## 2017-12-04 DIAGNOSIS — Z7901 Long term (current) use of anticoagulants: Secondary | ICD-10-CM

## 2017-12-04 DIAGNOSIS — G8929 Other chronic pain: Secondary | ICD-10-CM | POA: Diagnosis not present

## 2017-12-04 DIAGNOSIS — I5022 Chronic systolic (congestive) heart failure: Secondary | ICD-10-CM | POA: Insufficient documentation

## 2017-12-04 DIAGNOSIS — N183 Chronic kidney disease, stage 3 (moderate): Secondary | ICD-10-CM | POA: Insufficient documentation

## 2017-12-04 DIAGNOSIS — G629 Polyneuropathy, unspecified: Secondary | ICD-10-CM

## 2017-12-04 DIAGNOSIS — F419 Anxiety disorder, unspecified: Secondary | ICD-10-CM | POA: Diagnosis not present

## 2017-12-04 DIAGNOSIS — M549 Dorsalgia, unspecified: Secondary | ICD-10-CM | POA: Diagnosis not present

## 2017-12-04 DIAGNOSIS — Z95811 Presence of heart assist device: Secondary | ICD-10-CM

## 2017-12-04 DIAGNOSIS — I480 Paroxysmal atrial fibrillation: Secondary | ICD-10-CM | POA: Diagnosis not present

## 2017-12-04 DIAGNOSIS — Z95810 Presence of automatic (implantable) cardiac defibrillator: Secondary | ICD-10-CM | POA: Diagnosis not present

## 2017-12-04 DIAGNOSIS — I255 Ischemic cardiomyopathy: Secondary | ICD-10-CM | POA: Insufficient documentation

## 2017-12-04 DIAGNOSIS — I130 Hypertensive heart and chronic kidney disease with heart failure and stage 1 through stage 4 chronic kidney disease, or unspecified chronic kidney disease: Secondary | ICD-10-CM | POA: Insufficient documentation

## 2017-12-04 DIAGNOSIS — Z992 Dependence on renal dialysis: Secondary | ICD-10-CM | POA: Diagnosis not present

## 2017-12-04 DIAGNOSIS — Z79899 Other long term (current) drug therapy: Secondary | ICD-10-CM | POA: Insufficient documentation

## 2017-12-04 LAB — BASIC METABOLIC PANEL
Anion gap: 8 (ref 5–15)
BUN: 14 mg/dL (ref 6–20)
CALCIUM: 9.4 mg/dL (ref 8.9–10.3)
CO2: 26 mmol/L (ref 22–32)
CREATININE: 1.35 mg/dL — AB (ref 0.61–1.24)
Chloride: 102 mmol/L (ref 98–111)
GFR calc non Af Amer: 58 mL/min — ABNORMAL LOW (ref >60)
Glucose, Bld: 88 mg/dL (ref 70–99)
Potassium: 4.5 mmol/L (ref 3.5–5.1)
Sodium: 136 mmol/L (ref 135–145)

## 2017-12-04 LAB — CBC
HEMATOCRIT: 47.8 % (ref 39.0–52.0)
Hemoglobin: 14.6 g/dL (ref 13.0–17.0)
MCH: 25.7 pg — ABNORMAL LOW (ref 26.0–34.0)
MCHC: 30.5 g/dL (ref 30.0–36.0)
MCV: 84 fL (ref 78.0–100.0)
Platelets: 179 10*3/uL (ref 150–400)
RBC: 5.69 MIL/uL (ref 4.22–5.81)
RDW: 15.1 % (ref 11.5–15.5)
WBC: 7.9 10*3/uL (ref 4.0–10.5)

## 2017-12-04 LAB — PROTIME-INR
INR: 2.08
Prothrombin Time: 23.2 seconds — ABNORMAL HIGH (ref 11.4–15.2)

## 2017-12-04 LAB — MAGNESIUM: Magnesium: 2 mg/dL (ref 1.7–2.4)

## 2017-12-04 LAB — POCT INR: INR: 2.5 (ref 2.0–3.0)

## 2017-12-04 LAB — LACTATE DEHYDROGENASE: LDH: 185 U/L (ref 98–192)

## 2017-12-04 MED ORDER — LOSARTAN POTASSIUM 50 MG PO TABS: 50.0000 mg | ORAL_TABLET | Freq: Every day | ORAL | 3 refills | Status: DC

## 2017-12-04 MED ORDER — GABAPENTIN 300 MG PO CAPS: 300.0000 mg | ORAL_CAPSULE | Freq: Two times a day (BID) | ORAL | 6 refills | Status: DC

## 2017-12-04 MED ORDER — WARFARIN SODIUM 2 MG PO TABS: ORAL_TABLET | ORAL | 5 refills | Status: DC

## 2017-12-04 NOTE — Progress Notes (Signed)
Patient presents for 2 month follow up in [facility] today. Reports no problems with VAD equipment or concerns with drive line.  Pt reports he tried to decrease Lasix to 40 alternating with 20 mg as instructed last visit but voided "only one time daily" on the lower dose. He went back to 40 mg daily. He did run out of Lasix and has missed two doses.  He reports his home weights have not been going up drastically, but gradually. His wife says he "eats everything in sight".  Pt feels "the best I've felt in a long time". Reports increased stamina, walking @ 3 miles per day with slight SOB at end of walk when going up incline.  He denies orthopnea, sleeping on one pillow. He reports abdominal distention when he "gets fluid on board".   Wife asked that VAD Coordinator perform dressing change today - see below.   Pt is requesting pain management for sternal incision "burning" and chronic back pain from degenerative disc history. Per Dr. 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 Pt is to contact Dr. Bea Graff (PCP) and ask for pain clinic referral.   Pt and wife requested if pt can stop Magnesium (taking 300 mg tid). Mag level today 2.0; per Oda Kilts, PA will continue current dose of magnesium.   Per Dr. Haroldine Laws, we will notify [facility] for heart transplant evaluation referral. Pt and wife in agreement of same.   Vital Signs:  Doppler Pressure:  118 Automatc BP:  117/84 (108) HR: 97 SPO2: 96% RA  Weight: 172.8 lb w/oeqt Last weight: 160  lb Home weights: 168 - 169 lbs   VAD Indication: Destination therapy-   CKD  VAD interrogation & Equipment Management: Speed: 5400 Flow: 4.0 Power: 4.3w    PI: 7.6  Alarms: no clinical alarms Events:  20 - 40 PI events daily  Fixed speed 5400 Low speed limit: 5100  Primary Controller:  Replace back up battery in 24 months. Back up controller:   Replace back up battery in 24 months.  Annual Equipment  Maintenance on UBC/PM was performed on 03/2017.   I reviewed the LVAD parameters from today and compared the results to the patient's prior recorded data. LVAD interrogation was NEGATIVE for significant power changes, NEGATIVE for clinical alarms and STABLE for PI events/speed drops. No programming changes were made and pump is functioning within specified parameters. Pt is performing daily controller and system monitor self tests along with completing weekly and monthly maintenance for LVAD equipment.  LVAD equipment check completed and is in good working order. Back-up equipment present.    Exit Site Care: Drive line is being maintained weekly  by [name], his wife. Pt reports itching under dressing; wife has already stopped using skin prep, this seemed to make the itching worse. Existing VAD dressing removed and site care performed using sterile technique. Drive line exit site cleaned with sterile saline and betadine, allowed to dry, and Sorbaview dressing WITHOUT bio patch re-applied. Exit site healed and incorporated, the velour is fully implanted at exit site. Some redness under dressing and under bio patch; no drainage or foul odor noted. Drive line anchor re-applied. Pt denies fever or chills. Pt given 8 weekly dressings at this visit.   Asked pt/wife to try new cleansing technique, if skin irritation/itching continues, asked them to call VAD coordinator. We may need to switch to gauze dressing and change weekly (or as needed to keep site dry). Both verbalized understanding of  same.   Significant Events on VAD Support:  none  Device:Medtronic Bi-V Therapies: on Last check: 05/30/2017   BP & Labs:  Doppler BP 118  - Doppler is reflecting modified systolic  Hgb 16.1 - No S/S of bleeding. Specifically denies melena/BRBPR or nosebleeds.  LDH stable at 185with established baseline of 165- 250. Denies tea-colored urine. No power elevations noted on interrogation.    Patient  Instructions:  1. Increase Losartan to 50 mg daily. 2. Increase Gabapentin to 300 mg twice daily.  3. Contact Dr. Bea Graff for referral to pain clinic for chronic back pain. 4. We will send referral to [facility] for heart transplant evaluation.  5. Continue Magnesium. 6. Return to [facility] in 2 months.   Zada Girt  RN [place] Coordinator   Office: [phone] 24/7 Emergency VAD Pager: [phone]

## 2017-12-04 NOTE — Progress Notes (Signed)
LVAD Clinic Follow Up Note  HPI:  David [name] is a 52 y.o. male with history of ICD (09 PCI to LAD, mRCA, '10 PCI to Lcx and '12 mRCA), HFrEF (EF ~20% for >5 years), HLD and HTN. S/P LVAD HM-III 04/20/18.   Admitted 04/19/17 through 05/27/2017 for scheduled LVAD implant. On 12/27 he underwent HMIII LVAD implant. Hospital course complicated by PNA with achromobacter tracheal aspirate, shock liver, and AKI. Required short term dialysis.  Readmitted 05/30/17 with symptomatic anemia hgb 3.8.  INR 2.9. He was transfused with 4 units of packed red blood cell. 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  It was suspected AVMs were the likely cause of bleeding in the setting of anticoagulation.  He will need colonoscopy at some point, this can be done as outpatient. Discharged 06/04/17.  Follow up for Heart Failure/LVAD:  Presents today for routine follow up.  Weight shows up 23 lbs from visit in April. Feeling great overall. He denies SOB for anything but walking up hills. Walking about 3 miles a day. Still having occasional pocket pain and plagued by chronic back pain, which tramadol does not help. He denies orthopnea or PND. Appetite greatly improved. No fevers, chills, or problems with his driveline. Last visit, cut lasix back to 40 mg daily alternating with 20 mg daily, but barely had UOP on 20 mg days, so went back to 40 mg daily. Feels that works well. Denies edema or bloating. No bleeding, melena, or neuro symptoms. No VAD alarms. He wakes up in the ams feeling dry.   VAD Indication: Destination therapy-  transplant  limited by CKD  VAD interrogation & Equipment Management: Speed: 5400 Flow: 4.2 Power: 4.3 w PI: 7.6  Alarms: No alarms Events: 15-25 PI events daily  Fixed speed: 5400 Low speed limit: 5100  Primary Controller:  Replace back up battery in 24 months. Back up controller: Replace back up battery in 20months.  Annual Equipment Maintenance on UBC/PM was performed on 03/2017.  Reports taking Coumadin as prescribed and adherence to anticoagulation based dietary restrictions.  Denies bright red blood per rectum or melena, no dark urine or hematuria.     Past Medical History:  Diagnosis Date  . AICD (automatic cardioverter/defibrillator) present   . Anemia   . Anxiety   . CAD (coronary artery disease) 2009   AMI in 12/2007 with PCI to LAD, staged PCI to  mid/distal RCA, NSTEMI in 02/2009 with BMS to LCx  . CHF (congestive heart failure) ([place])   . Chronic kidney disease 11/03/2016   stage 3 kidney disease  . Dysrhythmia    "arrythmia problems at some point", "fatal rhythms"  . History of blood transfusion    "I was bleeding from was rectum" (04/19/2017)  . HLD (hyperlipidemia)   . HTN (hypertension)   . Ischemic cardiomyopathy    Admitted in 07/2010 with CHF exacerbation  . Myocardial infarction ([place])    "I've had 4" (04/19/2017)  . Pneumonia 2018 X 1  . Seizures ([place])    one seizure in 04/2016 during cardiac event (04/19/2017)    Current Outpatient Medications  Medication Sig Dispense Refill  . atorvastatin (LIPITOR) 40 MG tablet TAKE 1 TABLET BY MOUTH ONCE DAILY AT  6  PM. 90 tablet 2  . furosemide (LASIX) 40 MG tablet Take 1 tablet (40 mg total) by mouth daily. KEEP OV. [place]  tablet 3  . gabapentin (NEURONTIN) 300 MG capsule Take 1 capsule (300 mg total) by mouth daily. 30 capsule 3  . losartan (COZAAR) 25 MG tablet Take 1 tablet (25 mg total) by mouth daily. 90 tablet 3  . Magnesium Oxide 400 MG CAPS Take 1 capsule (400 mg total) by mouth 3 (three) times daily. 90 capsule 6  . Multiple Vitamins-Minerals (ADULT GUMMY) CHEW Chew 2 tablets by mouth daily.    . pantoprazole (PROTONIX) 40 MG tablet Take 1 tablet (40 mg total) by mouth daily. 30 tablet 6  . potassium chloride SA (KLOR-CON M20) 20  MEQ tablet Take 1 tablet (20 mEq total) by mouth daily. KEEP OV. 90 tablet 3  . traZODone (DESYREL) 50 MG tablet Take 1 tablet (50 mg total) by mouth at bedtime. 30 tablet 3  . warfarin (COUMADIN) 2 MG tablet Take 2 tablets (4 mg) daily except 1 tablets (2 mg) on Tuesday, Thursday, Sunday OR AS DIRECTED 50 tablet 5  . traMADol (ULTRAM) 50 MG tablet Take 2 tablets (100 mg total) by mouth every 8 (eight) hours as needed for moderate pain. (Patient not taking: Reported on 10/03/2017) 120 tablet 0   No current facility-administered medications for this encounter.     Plavix [clopidogrel bisulfate]  Review of systems complete and found to be negative unless listed in HPI.     Vitals:   12/04/17 1021 12/04/17 1022  BP: (!) 118/0 117/84  Pulse:  97  SpO2:  98%  Weight:  78.4 kg (172 lb 12.8 oz)  Height:  5\' 5"  (1.651 m)   Vital Signs:  Doppler Pressure 118 Automatc BP: 110/70 (87) HR: 117/84 (108) SPO2: 98%  Weight: 172 lbs w/oeqt Last weight: 149.8 lbs Home weights: 165 lbs    Physical Exam: General:  NAD.  HEENT: normal  Neck: supple. JVP 7-8.  Carotids 2+ bilat; no bruits. No lymphadenopathy or thryomegaly appreciated. Cor: LVAD hum.  Lungs: Clear. Abdomen:  soft, nontender, non-distended. No hepatosplenomegaly. No bruits or masses. Good bowel sounds. Driveline site clean. Anchor in place.  Extremities: no cyanosis, clubbing, rash. Warm no edema  Neuro: alert & oriented x 3. No focal deficits. Moves all 4 without problem    ASSESSMENT AND PLAN:   1. Chronic systolic CHF with HM III LVAD implant 04/20/17:  - Doing extremely well 3.5 months post VAD implant. NYHA I-II - Volume status at least mildly elevated  - Continue lasix 40 daily. Can cut back as needed.  2. Symptomatic anemia to UGI AVM bleed - s/p APC x 4 lesions in 2/19 - Hgb stable today at 14.6.  - No evidence of active bleeding - Continue PPI - Off ASA. Goal INR still 2.0-2.5 3. H/O of AKI on CKD Stage  III:  - Creatinine 1.35 this am.  4. VAD management - VAD interrogated personally. Parameters stable.   - LDH 185 - INR goal 2.0-2.5 Off ASA due to GIB 2/19.  - INR 2.08 this am. Dosing per Pharm D.  - MAP elevated. Increase losartan 5, Chest/pocket pain - Continue neurontin 300 daily - Continue tramadol prn. 6. PAF:  - Maintaining NSR.  7. CAD:  s/p PCI RCA/PLOM with DES x2 and DES to mild LAD 1/18 - No s/s of ischemia.    - Continue statin. Off ASA due to GIB 8. Hypomagnesemia. - Continue magnesium oxide 400 TID. Check Mg today to confirm dosing 9. Chronic back pain - Asking for something more than tramadol. May need pain  clinic referral.   Shirley Friar, PA-C  10:38 AM  Patient seen and examined with the above-signed Advanced Practice Provider and/or Housestaff. I personally reviewed laboratory data, imaging studies and relevant notes. I independently examined the patient and formulated the important aspects of the plan. I have edited the note to reflect any of my changes or salient points. I have personally discussed the plan with the patient and/or family.  Doing very well with VAD support. NYHA I-II. Weight up about 20 pounds but volume status looks ok. Will continue lasix 40 daily. Renal function stable. MAPs up today but says he can't afford Entresto. Will increase losartan.   INR 2.08. Personally reviewed. LDH ok. Discussed possibility of proceeding with transplant w/u and he is interested. Will refer to [facility] for eval. Body mass index is 28.76 kg/m. Stressed need to remain active and not to continue to gain weight. hgb stable at 14.6. VAD interrogated personally. Parameters stable.  Total time personally spent 35 minutes. Over half that time spent discussing above.   Glori Bickers, MD  12:29 PM   .

## 2017-12-04 NOTE — Patient Instructions (Addendum)
1. Increase Losartan to 50 mg daily. 2. Increase Gabapentin to 300 mg twice daily.  3. Contact Dr. Bea Graff for referral to pain clinic for chronic back pain. 4. We will send referral to [facility] for heart transplant evaluation.  5. Continue Magnesium. 6. Return to [place] clinic in 2 months.

## 2017-12-08 ENCOUNTER — Other Ambulatory Visit (HOSPITAL_COMMUNITY): Payer: Self-pay | Admitting: Unknown Physician Specialty

## 2017-12-11 ENCOUNTER — Ambulatory Visit (HOSPITAL_COMMUNITY): Payer: Self-pay | Admitting: Pharmacist

## 2017-12-11 DIAGNOSIS — Z95811 Presence of heart assist device: Secondary | ICD-10-CM

## 2017-12-14 ENCOUNTER — Encounter: Payer: Self-pay | Admitting: *Deleted

## 2017-12-14 NOTE — Progress Notes (Signed)
Patient ID: David [name], male   DOB: 05/28/1965, 52 y.o.   MRN: [id number]   Patient packet emailed and faxed to [facility] per Dr. Clayborne Dana request for consideration of heart transplant evaluation.  Zada Girt RN, [place] Coordinator Office: [phone] Fax: [phone]

## 2017-12-18 ENCOUNTER — Ambulatory Visit (HOSPITAL_COMMUNITY): Payer: Self-pay | Admitting: Pharmacist

## 2017-12-18 DIAGNOSIS — Z95811 Presence of heart assist device: Secondary | ICD-10-CM

## 2017-12-18 LAB — POCT INR: INR: 2.4 (ref 2.0–3.0)

## 2017-12-25 LAB — POCT INR: INR: 1.8 — AB (ref 2.0–3.0)

## 2017-12-26 ENCOUNTER — Ambulatory Visit (HOSPITAL_COMMUNITY): Payer: Self-pay | Admitting: Pharmacist

## 2017-12-26 DIAGNOSIS — Z95811 Presence of heart assist device: Secondary | ICD-10-CM

## 2017-12-27 ENCOUNTER — Other Ambulatory Visit (HOSPITAL_COMMUNITY): Payer: Self-pay | Admitting: Internal Medicine

## 2018-01-01 ENCOUNTER — Ambulatory Visit (HOSPITAL_COMMUNITY): Payer: Self-pay | Admitting: Pharmacist

## 2018-01-01 ENCOUNTER — Telehealth: Payer: Self-pay | Admitting: Internal Medicine

## 2018-01-01 ENCOUNTER — Encounter: Payer: Self-pay | Admitting: Internal Medicine

## 2018-01-01 DIAGNOSIS — Z95811 Presence of heart assist device: Secondary | ICD-10-CM

## 2018-01-01 LAB — POCT INR: INR: 2.4 (ref 2.0–3.0)

## 2018-01-08 ENCOUNTER — Ambulatory Visit (HOSPITAL_COMMUNITY): Payer: Self-pay | Admitting: Pharmacist

## 2018-01-08 DIAGNOSIS — Z95811 Presence of heart assist device: Secondary | ICD-10-CM

## 2018-01-08 LAB — POCT INR: INR: 2.2 (ref 2–3)

## 2018-01-15 LAB — POCT INR: INR: 2.2 (ref 2.0–3.0)

## 2018-01-16 ENCOUNTER — Ambulatory Visit (HOSPITAL_COMMUNITY): Payer: Self-pay | Admitting: Pharmacist

## 2018-01-16 DIAGNOSIS — Z95811 Presence of heart assist device: Secondary | ICD-10-CM

## 2018-01-22 ENCOUNTER — Ambulatory Visit (HOSPITAL_COMMUNITY): Payer: Self-pay | Admitting: Pharmacist

## 2018-01-22 DIAGNOSIS — Z95811 Presence of heart assist device: Secondary | ICD-10-CM

## 2018-01-22 LAB — POCT INR: INR: 2 (ref 2.0–3.0)

## 2018-01-26 ENCOUNTER — Other Ambulatory Visit: Payer: Self-pay | Admitting: *Deleted

## 2018-01-26 ENCOUNTER — Other Ambulatory Visit (HOSPITAL_COMMUNITY): Payer: Self-pay | Admitting: *Deleted

## 2018-01-26 ENCOUNTER — Encounter: Payer: Self-pay | Admitting: Cardiology

## 2018-01-26 DIAGNOSIS — Z95811 Presence of heart assist device: Secondary | ICD-10-CM

## 2018-01-26 DIAGNOSIS — Z7901 Long term (current) use of anticoagulants: Secondary | ICD-10-CM

## 2018-01-29 ENCOUNTER — Ambulatory Visit (HOSPITAL_COMMUNITY)
Admission: RE | Admit: 2018-01-29 | Discharge: 2018-01-29 | Disposition: A | Discharge status: Home / Self Care | Payer: Medicare HMO | Source: Ambulatory Visit | Attending: Internal Medicine | Admitting: Internal Medicine

## 2018-01-29 ENCOUNTER — Encounter (HOSPITAL_COMMUNITY): Payer: Self-pay

## 2018-01-29 ENCOUNTER — Ambulatory Visit (HOSPITAL_COMMUNITY): Payer: Self-pay | Admitting: Pharmacist

## 2018-01-29 VITALS — BP 131/75 | HR 92 | Wt 172.8 lb

## 2018-01-29 DIAGNOSIS — Z95811 Presence of heart assist device: Secondary | ICD-10-CM

## 2018-01-29 DIAGNOSIS — Z7901 Long term (current) use of anticoagulants: Secondary | ICD-10-CM | POA: Diagnosis present

## 2018-01-29 DIAGNOSIS — I5022 Chronic systolic (congestive) heart failure: Secondary | ICD-10-CM | POA: Diagnosis not present

## 2018-01-29 DIAGNOSIS — I480 Paroxysmal atrial fibrillation: Secondary | ICD-10-CM

## 2018-01-29 DIAGNOSIS — I10 Essential (primary) hypertension: Secondary | ICD-10-CM

## 2018-01-29 LAB — CBC
HCT: 51.8 % (ref 39.0–52.0)
Hemoglobin: 16 g/dL (ref 13.0–17.0)
MCH: 25.8 pg — AB (ref 26.0–34.0)
MCHC: 30.9 g/dL (ref 30.0–36.0)
MCV: 83.4 fL (ref 78.0–100.0)
Platelets: 177 10*3/uL (ref 150–400)
RBC: 6.21 MIL/uL — ABNORMAL HIGH (ref 4.22–5.81)
RDW: 14.6 % (ref 11.5–15.5)
WBC: 7.2 10*3/uL (ref 4.0–10.5)

## 2018-01-29 LAB — BASIC METABOLIC PANEL
Anion gap: 8 (ref 5–15)
BUN: 16 mg/dL (ref 6–20)
CHLORIDE: 103 mmol/L (ref 98–111)
CO2: 24 mmol/L (ref 22–32)
CREATININE: 1.44 mg/dL — AB (ref 0.61–1.24)
Calcium: 9.6 mg/dL (ref 8.9–10.3)
GFR calc Af Amer: >60 mL/min (ref >60)
GFR calc non Af Amer: 54 mL/min — ABNORMAL LOW (ref >60)
GLUCOSE: 80 mg/dL (ref 70–99)
Potassium: 4.1 mmol/L (ref 3.5–5.1)
SODIUM: 135 mmol/L (ref 135–145)

## 2018-01-29 LAB — LACTATE DEHYDROGENASE: LDH: 220 U/L — AB (ref 98–192)

## 2018-01-29 LAB — PROTIME-INR
INR: 1.87
Prothrombin Time: 21.4 seconds — ABNORMAL HIGH (ref 11.4–15.2)

## 2018-01-29 NOTE — Progress Notes (Signed)
LVAD Clinic Follow Up Note  HPI:  David [name] is a 52 y.o. male with history of ICD (09 PCI to LAD, mRCA, '10 PCI to Lcx and '12 mRCA), HFrEF (EF ~20% for >5 years), HLD and HTN. S/P LVAD HM-III 04/20/18.   Admitted 04/19/17 through 05/27/2017 for scheduled LVAD implant. On 12/27 he underwent HMIII LVAD implant. Hospital course complicated by PNA with achromobacter tracheal aspirate, shock liver, and AKI. Required short term dialysis.  Readmitted 05/30/17 with symptomatic anemia hgb 3.8.  INR 2.9. He was transfused with 4 units of packed red blood cell. 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  It was suspected AVMs were the likely cause of bleeding in the setting of anticoagulation.  He will need colonoscopy at some point, this can be done as outpatient. Discharged 06/04/17.  Follow up for Heart Failure/LVAD:  Presents for routine f/u with his wife. Feels great. Doing anything he wants to do. Ws seen at [facility] but wants to hold off until his wife deals with her medical issues first. Still with tingling pain at incision site that only partially responds to Neurontin 300 tid. Checking INRs at home 2.1-2.2. Denies orthopnea or PND. No fevers, chills or problems with driveline. No bleeding, melena or neuro symptoms. No VAD alarms. Taking all meds as prescribed.   VAD Indication: Destination therapy-   CKD  VAD interrogation & Equipment Management: Speed: 5400 Flow: 4.6 Power: 4.2w PI: 5.3  Alarms: no clinical alarms Events:  10-20 PI events daily  Fixed speed 5400 Low speed limit: 5100  Primary Controller: Replace back up battery in 24 months. Back up controller: Replace back up battery in 31months.  Annual Equipment Maintenance on UBC/PM was performed on 03/2017. Reports taking Coumadin as  prescribed and adherence to anticoagulation based dietary restrictions.  Denies bright red blood per rectum or melena, no dark urine or hematuria.     Past Medical History:  Diagnosis Date  . AICD (automatic cardioverter/defibrillator) present   . Anemia   . Anxiety   . CAD (coronary artery disease) 2009   AMI in 12/2007 with PCI to LAD, staged PCI to  mid/distal RCA, NSTEMI in 02/2009 with BMS to LCx  . CHF (congestive heart failure) ([place])   . Chronic kidney disease 11/03/2016   stage 3 kidney disease  . Dysrhythmia    "arrythmia problems at some point", "fatal rhythms"  . History of blood transfusion    "I was bleeding from was rectum" (04/19/2017)  . HLD (hyperlipidemia)   . HTN (hypertension)   . Ischemic cardiomyopathy    Admitted in 07/2010 with CHF exacerbation  . Myocardial infarction ([place])    "I've had 4" (04/19/2017)  . Pneumonia 2018 X 1  . Seizures ([place])    one seizure in 04/2016 during cardiac event (04/19/2017)    Current Outpatient Medications  Medication Sig Dispense Refill  . atorvastatin (LIPITOR) 40 MG tablet TAKE 1 TABLET BY MOUTH ONCE DAILY AT  6  PM. 90 tablet 2  . furosemide (LASIX) 40 MG tablet Take 1 tablet (40 mg total) by mouth daily. KEEP OV. 90 tablet 3  . gabapentin (NEURONTIN) 300 MG capsule Take 1 capsule (300 mg total) by mouth 2 (two) times daily. 60 capsule 6  . losartan (COZAAR) 50 MG tablet Take 1 tablet (50 mg total) by mouth daily. 90 tablet 3  .  Magnesium Oxide 400 MG CAPS Take 1 capsule (400 mg total) by mouth 3 (three) times daily. 90 capsule 6  . Multiple Vitamins-Minerals (ADULT GUMMY) CHEW Chew 2 tablets by mouth daily.    . pantoprazole (PROTONIX) 40 MG tablet Take 1 tablet (40 mg total) by mouth daily. 30 tablet 6  . potassium chloride SA (KLOR-CON M20) 20 MEQ tablet Take 1 tablet (20 mEq total) by mouth daily. KEEP OV. 90 tablet 3  . traZODone (DESYREL) 50 MG tablet TAKE 1 TABLET BY MOUTH AT BEDTIME 30 tablet 3  . warfarin (COUMADIN)  2 MG tablet Take 2 tablets (4 mg) daily except 1 tablets (2 mg) on Tuesday, Thursday, Sunday OR AS DIRECTED 50 tablet 5  . traMADol (ULTRAM) 50 MG tablet Take 2 tablets (100 mg total) by mouth every 8 (eight) hours as needed for moderate pain. (Patient not taking: Reported on 10/03/2017) 120 tablet 0   No current facility-administered medications for this encounter.     Plavix [clopidogrel bisulfate]  Review of systems complete and found to be negative unless listed in HPI.     Vitals:   01/29/18 1006 01/29/18 1011  BP: (!) 100/0 131/75  Pulse: 92   SpO2: 95%   Weight: 78.4 kg (172 lb 12.8 oz)    Vital Signs:  Doppler Pressure: 100 Automatc BP:  131/75 (89) HR: 92 SPO2: 95% RA  Weight: 172.8 lb w/oeqt Last weight: 172.8 lb Home weights: 168 - 169 lbs    Physical Exam: General:  NAD.  HEENT: normal  Neck: supple. JVP not elevated.  Carotids 2+ bilat; no bruits. No lymphadenopathy or thryomegaly appreciated. Cor: LVAD hum.  Lungs: Clear. Abdomen: soft, nontender, non-distended. No hepatosplenomegaly. No bruits or masses. Good bowel sounds. Driveline site clean. Anchor in place.  Extremities: no cyanosis, clubbing, rash. Warm no edema  Neuro: alert & oriented x 3. No focal deficits. Moves all 4 without problem     ASSESSMENT AND PLAN:   1. Chronic systolic CHF with HM III LVAD implant 04/20/17:  - Doing extremely well with VAD. NYHA I - Volume status looks good. Continue lasix as needed - He has been seen at [facility] for transplant referral but wants to hold off for now until his wife gets through her health issues/surgeries 2. 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 Goal INR still 2.0-2.5 - INR 1.87 today. Discussed dosing with PharmD personally. 3. H/O of AKI on CKD Stage III:  - Creatinine stable at 1.4 4. VAD management - VAD interrogated personally.  Parameters stable. - LDH 220 - INR goal 2.0-2.5 Off ASA due to GIB 2/19.  - INR 1.87 this am. Discussed dosing with PharmD personally. - MAP well controlled. Continue losartan. Can increase as needed. 5, Chest/pocket pain - Continue neurontin 300 daily - Continue tramadol prn. - Add lidocaine patch 6. PAF:  - Maintaining NSR. Continue warfarin 7. CAD:  s/p PCI RCA/PLOM with DES x2 and DES to mild LAD 1/18 - No s/s of ischemia  - Continue statin. Off ASA due to GIB 8. Hypomagnesemia. - Continue magnesium oxide 400 TID.  Total time spent 35 minutes. Over half that time spent discussing above.    Glori Bickers, MD  10:59 AM  .

## 2018-01-29 NOTE — Patient Instructions (Signed)
1. Lidoderm patch over the counter

## 2018-01-29 NOTE — Progress Notes (Signed)
Patient presents for 2 month follow up in [facility] today. Reports no problems with VAD equipment or concerns with drive line. Pt feels "the best I've felt in a long time".   Pt reports he had to take 1 extra Lasix pill "3 weeks ago" and "has been fine since."   Wife asked that VAD Coordinator perform dressing change today - see below.   Pt states that he is still having sternal incision "burning" in between doses of Gabapentin. Per Dr. Haroldine Laws he can use Lidoderm patch for this. (His insurance will not cover prescription for Lidoderm patch. Spoke with Veverly Fells PharmD. Instructed to purchase over the counter patch.) Also reiterated that he is to contact Dr. Bea Graff (PCP) and ask for pain clinic referral.   Per wife, she has "surgeries on her feet" that need to be taken care of before they start seriously discussing transplant options at [facility]. Dr. Haroldine Laws aware.     Vital Signs:  Doppler Pressure: 100 Automatc BP:  131/75 (89) HR: 92 SPO2: 95% RA  Weight: 172.8 lb w/oeqt Last weight: 172.8 lb Home weights: 168 - 169 lbs   VAD Indication: Destination therapy-   CKD  VAD interrogation & Equipment Management: Speed: 5400 Flow: 4.6 Power: 4.2w    PI: 5.3  Alarms: no clinical alarms Events:  10-20 PI events daily  Fixed speed 5400 Low speed limit: 5100  Primary Controller:  Replace back up battery in 24 months. Back up controller:   Replace back up battery in 24 months.  Annual Equipment Maintenance on UBC/PM was performed on 03/2017.   I reviewed the LVAD parameters from today and compared the results to the patient's prior recorded data. LVAD interrogation was NEGATIVE for significant power changes, NEGATIVE for clinical alarms and STABLE for PI events/speed drops. No programming changes were made and pump is functioning within specified parameters. Pt is performing daily controller and system monitor self tests along with completing weekly and monthly maintenance  for LVAD equipment.  LVAD equipment check completed and is in good working order. Back-up equipment present.    Exit Site Care: Drive line is being maintained weekly  by [name], his wife. Pt reports itching under dressing; wife has already stopped using skin prep, this seemed to make the itching worse. Existing VAD dressing removed and site care performed using sterile technique. Drive line exit site cleaned with sterile saline and betadine, allowed to dry, and [place] dressing WITHOUT bio patch re-applied. Exit site healed and incorporated, the velour is fully implanted at exit site. Some redness under dressing and under bio patch; no drainage or foul odor noted. Drive line anchor re-applied. Pt denies fever or chills. Pt given 10 weekly dressings at this visit.   Asked pt/wife to try new cleansing technique, and to stop using biopatch. If skin irritation/itching continues, asked them to call VAD coordinator. We may need to switch to gauze dressing and change weekly (or as needed to keep site dry). Both verbalized understanding of same.   Significant Events on VAD Support:  none  Device:Medtronic Bi-V Therapies: on Last check: 05/30/2017   BP & Labs:  Doppler BP 100 - Doppler is reflecting MAP.  Hgb 16.0 - No S/S of bleeding. Specifically denies melena/BRBPR or nosebleeds.  LDH stable at 220with established baseline of 165- 250. Denies tea-colored urine. No power elevations noted on interrogation.    Patient Instructions:  1. Lidoderm patch OTC for sternal burning pain.  2. Return to [place] clinic in 2 months.  Emerson Monte RN [place] Coordinator  Office: [phone]  24/7 Pager: [phone]

## 2018-02-02 ENCOUNTER — Other Ambulatory Visit (HOSPITAL_COMMUNITY): Payer: Self-pay | Admitting: *Deleted

## 2018-02-02 DIAGNOSIS — Z95811 Presence of heart assist device: Secondary | ICD-10-CM

## 2018-02-02 DIAGNOSIS — Z7901 Long term (current) use of anticoagulants: Secondary | ICD-10-CM

## 2018-02-05 ENCOUNTER — Ambulatory Visit (HOSPITAL_COMMUNITY): Payer: Self-pay | Admitting: Pharmacist

## 2018-02-05 DIAGNOSIS — Z95811 Presence of heart assist device: Secondary | ICD-10-CM

## 2018-02-05 LAB — POCT INR: INR: 1.8 — AB (ref 2.0–3.0)

## 2018-02-12 ENCOUNTER — Ambulatory Visit (HOSPITAL_COMMUNITY): Payer: Self-pay | Admitting: Pharmacist

## 2018-02-12 DIAGNOSIS — Z95811 Presence of heart assist device: Secondary | ICD-10-CM

## 2018-02-12 LAB — POCT INR: INR: 1.7 — AB (ref 2.0–3.0)

## 2018-02-19 ENCOUNTER — Ambulatory Visit (HOSPITAL_COMMUNITY): Payer: Self-pay | Admitting: Pharmacist

## 2018-02-19 DIAGNOSIS — Z95811 Presence of heart assist device: Secondary | ICD-10-CM

## 2018-02-19 LAB — POCT INR: INR: 2.6 (ref 2.0–3.0)

## 2018-02-23 ENCOUNTER — Other Ambulatory Visit (HOSPITAL_COMMUNITY): Payer: Self-pay | Admitting: Internal Medicine

## 2018-02-23 DIAGNOSIS — G629 Polyneuropathy, unspecified: Secondary | ICD-10-CM

## 2018-02-26 ENCOUNTER — Ambulatory Visit (HOSPITAL_COMMUNITY): Payer: Self-pay | Admitting: Pharmacist

## 2018-02-26 DIAGNOSIS — Z95811 Presence of heart assist device: Secondary | ICD-10-CM

## 2018-02-26 LAB — POCT INR: INR: 2.4 (ref 2.0–3.0)

## 2018-03-05 ENCOUNTER — Ambulatory Visit (HOSPITAL_COMMUNITY): Payer: Self-pay | Admitting: Pharmacist

## 2018-03-05 DIAGNOSIS — Z95811 Presence of heart assist device: Secondary | ICD-10-CM

## 2018-03-05 LAB — POCT INR: INR: 2.2 (ref 2.0–3.0)

## 2018-03-12 ENCOUNTER — Ambulatory Visit (HOSPITAL_COMMUNITY): Payer: Self-pay | Admitting: Pharmacist

## 2018-03-12 DIAGNOSIS — Z95811 Presence of heart assist device: Secondary | ICD-10-CM

## 2018-03-12 LAB — POCT INR: INR: 2.7 (ref 2.0–3.0)

## 2018-03-19 ENCOUNTER — Ambulatory Visit (HOSPITAL_COMMUNITY): Payer: Self-pay | Admitting: Pharmacist

## 2018-03-19 DIAGNOSIS — Z95811 Presence of heart assist device: Secondary | ICD-10-CM

## 2018-03-19 LAB — POCT INR: INR: 2.1 (ref 2.0–3.0)

## 2018-03-24 ENCOUNTER — Other Ambulatory Visit (HOSPITAL_COMMUNITY): Payer: Self-pay | Admitting: Student

## 2018-03-24 ENCOUNTER — Other Ambulatory Visit (HOSPITAL_COMMUNITY): Payer: Self-pay | Admitting: Internal Medicine

## 2018-03-24 DIAGNOSIS — G629 Polyneuropathy, unspecified: Secondary | ICD-10-CM

## 2018-03-26 ENCOUNTER — Other Ambulatory Visit (HOSPITAL_COMMUNITY): Payer: Self-pay | Admitting: *Deleted

## 2018-03-26 ENCOUNTER — Ambulatory Visit (HOSPITAL_COMMUNITY): Payer: Self-pay | Admitting: Pharmacist

## 2018-03-26 DIAGNOSIS — Z95811 Presence of heart assist device: Secondary | ICD-10-CM

## 2018-03-26 DIAGNOSIS — G629 Polyneuropathy, unspecified: Secondary | ICD-10-CM

## 2018-03-26 LAB — POCT INR: INR: 2.5 (ref 2.0–3.0)

## 2018-03-30 ENCOUNTER — Other Ambulatory Visit (HOSPITAL_COMMUNITY): Payer: Self-pay | Admitting: Unknown Physician Specialty

## 2018-03-30 DIAGNOSIS — Z95811 Presence of heart assist device: Secondary | ICD-10-CM

## 2018-03-30 DIAGNOSIS — Z7901 Long term (current) use of anticoagulants: Secondary | ICD-10-CM

## 2018-04-02 ENCOUNTER — Ambulatory Visit (HOSPITAL_COMMUNITY)
Admission: RE | Admit: 2018-04-02 | Discharge: 2018-04-02 | Disposition: A | Discharge status: Home / Self Care | Payer: Medicare HMO | Source: Ambulatory Visit | Attending: Cardiology | Admitting: Cardiology

## 2018-04-02 ENCOUNTER — Ambulatory Visit (HOSPITAL_COMMUNITY): Payer: Self-pay | Admitting: Pharmacist

## 2018-04-02 ENCOUNTER — Encounter (HOSPITAL_COMMUNITY): Payer: Self-pay | Admitting: *Deleted

## 2018-04-02 ENCOUNTER — Encounter (HOSPITAL_COMMUNITY): Payer: Self-pay

## 2018-04-02 DIAGNOSIS — F419 Anxiety disorder, unspecified: Secondary | ICD-10-CM | POA: Diagnosis not present

## 2018-04-02 DIAGNOSIS — Z4509 Encounter for adjustment and management of other cardiac device: Secondary | ICD-10-CM | POA: Diagnosis not present

## 2018-04-02 DIAGNOSIS — Z7901 Long term (current) use of anticoagulants: Secondary | ICD-10-CM | POA: Diagnosis not present

## 2018-04-02 DIAGNOSIS — E785 Hyperlipidemia, unspecified: Secondary | ICD-10-CM | POA: Insufficient documentation

## 2018-04-02 DIAGNOSIS — I5020 Unspecified systolic (congestive) heart failure: Secondary | ICD-10-CM

## 2018-04-02 DIAGNOSIS — G629 Polyneuropathy, unspecified: Secondary | ICD-10-CM | POA: Diagnosis not present

## 2018-04-02 DIAGNOSIS — Z79899 Other long term (current) drug therapy: Secondary | ICD-10-CM | POA: Insufficient documentation

## 2018-04-02 DIAGNOSIS — I5043 Acute on chronic combined systolic (congestive) and diastolic (congestive) heart failure: Secondary | ICD-10-CM

## 2018-04-02 DIAGNOSIS — Z95810 Presence of automatic (implantable) cardiac defibrillator: Secondary | ICD-10-CM | POA: Insufficient documentation

## 2018-04-02 DIAGNOSIS — I480 Paroxysmal atrial fibrillation: Secondary | ICD-10-CM | POA: Diagnosis not present

## 2018-04-02 DIAGNOSIS — I2510 Atherosclerotic heart disease of native coronary artery without angina pectoris: Secondary | ICD-10-CM | POA: Insufficient documentation

## 2018-04-02 DIAGNOSIS — Z95811 Presence of heart assist device: Secondary | ICD-10-CM

## 2018-04-02 DIAGNOSIS — I252 Old myocardial infarction: Secondary | ICD-10-CM | POA: Diagnosis not present

## 2018-04-02 DIAGNOSIS — N183 Chronic kidney disease, stage 3 (moderate): Secondary | ICD-10-CM | POA: Insufficient documentation

## 2018-04-02 DIAGNOSIS — Z955 Presence of coronary angioplasty implant and graft: Secondary | ICD-10-CM | POA: Insufficient documentation

## 2018-04-02 DIAGNOSIS — D649 Anemia, unspecified: Secondary | ICD-10-CM | POA: Diagnosis not present

## 2018-04-02 DIAGNOSIS — I255 Ischemic cardiomyopathy: Secondary | ICD-10-CM | POA: Diagnosis not present

## 2018-04-02 DIAGNOSIS — I5022 Chronic systolic (congestive) heart failure: Secondary | ICD-10-CM | POA: Diagnosis not present

## 2018-04-02 DIAGNOSIS — I130 Hypertensive heart and chronic kidney disease with heart failure and stage 1 through stage 4 chronic kidney disease, or unspecified chronic kidney disease: Secondary | ICD-10-CM | POA: Insufficient documentation

## 2018-04-02 DIAGNOSIS — I10 Essential (primary) hypertension: Secondary | ICD-10-CM

## 2018-04-02 LAB — PROTIME-INR
INR: 1.73
Prothrombin Time: 20 seconds — ABNORMAL HIGH (ref 11.4–15.2)

## 2018-04-02 LAB — CBC
HCT: 52.9 % — ABNORMAL HIGH (ref 39.0–52.0)
Hemoglobin: 16 g/dL (ref 13.0–17.0)
MCH: 25.2 pg — AB (ref 26.0–34.0)
MCHC: 30.2 g/dL (ref 30.0–36.0)
MCV: 83.2 fL (ref 80.0–100.0)
Platelets: 184 10*3/uL (ref 150–400)
RBC: 6.36 MIL/uL — AB (ref 4.22–5.81)
RDW: 14.6 % (ref 11.5–15.5)
WBC: 7.8 10*3/uL (ref 4.0–10.5)
nRBC: 0 % (ref 0.0–0.2)

## 2018-04-02 LAB — COMPREHENSIVE METABOLIC PANEL
ALT: 29 U/L (ref 0–44)
AST: 28 U/L (ref 15–41)
Albumin: 3.6 g/dL (ref 3.5–5.0)
Alkaline Phosphatase: 123 U/L (ref 38–126)
Anion gap: 12 (ref 5–15)
BUN: 12 mg/dL (ref 6–20)
CO2: 26 mmol/L (ref 22–32)
CREATININE: 1.54 mg/dL — AB (ref 0.61–1.24)
Calcium: 9.4 mg/dL (ref 8.9–10.3)
Chloride: 101 mmol/L (ref 98–111)
GFR calc non Af Amer: 50 mL/min — ABNORMAL LOW (ref >60)
GFR, EST AFRICAN AMERICAN: 58 mL/min — AB (ref >60)
Glucose, Bld: 99 mg/dL (ref 70–99)
Potassium: 4.4 mmol/L (ref 3.5–5.1)
Sodium: 139 mmol/L (ref 135–145)
Total Bilirubin: 1.1 mg/dL (ref 0.3–1.2)
Total Protein: 8.8 g/dL — ABNORMAL HIGH (ref 6.5–8.1)

## 2018-04-02 LAB — LIPID PANEL
CHOLESTEROL: 178 mg/dL (ref 0–200)
HDL: 42 mg/dL (ref >40)
LDL Cholesterol: 111 mg/dL — ABNORMAL HIGH (ref 0–99)
TRIGLYCERIDES: 124 mg/dL (ref <150)
Total CHOL/HDL Ratio: 4.2 RATIO
VLDL: 25 mg/dL (ref 0–40)

## 2018-04-02 LAB — PREALBUMIN: Prealbumin: 31.1 mg/dL (ref 18–38)

## 2018-04-02 LAB — LACTATE DEHYDROGENASE: LDH: 196 U/L — ABNORMAL HIGH (ref 98–192)

## 2018-04-02 MED ORDER — FUROSEMIDE 40 MG PO TABS: 40.0000 mg | ORAL_TABLET | Freq: Every day | ORAL | 3 refills | Status: DC

## 2018-04-02 MED ORDER — TRAZODONE HCL 50 MG PO TABS: 50.0000 mg | ORAL_TABLET | Freq: Every day | ORAL | 3 refills | Status: DC

## 2018-04-02 MED ORDER — POTASSIUM CHLORIDE CRYS ER 20 MEQ PO TBCR: 20.0000 meq | EXTENDED_RELEASE_TABLET | Freq: Every day | ORAL | 3 refills | Status: DC

## 2018-04-02 MED ORDER — ATORVASTATIN CALCIUM 40 MG PO TABS: ORAL_TABLET | ORAL | 2 refills | Status: DC

## 2018-04-02 MED ORDER — LOSARTAN POTASSIUM 50 MG PO TABS: 50.0000 mg | ORAL_TABLET | Freq: Every day | ORAL | 3 refills | Status: DC

## 2018-04-02 MED ORDER — WARFARIN SODIUM 2 MG PO TABS: 4.0000 mg | ORAL_TABLET | Freq: Every day | ORAL | 4 refills | Status: DC

## 2018-04-02 MED ORDER — PANTOPRAZOLE SODIUM 40 MG PO TBEC: 40.0000 mg | DELAYED_RELEASE_TABLET | Freq: Every day | ORAL | 0 refills | Status: DC

## 2018-04-02 MED ORDER — GABAPENTIN 300 MG PO CAPS: 300.0000 mg | ORAL_CAPSULE | Freq: Two times a day (BID) | ORAL | 6 refills | Status: DC

## 2018-04-02 NOTE — Addendum Note (Signed)
Encounter addended by: Mertha Baars, RN on: 04/02/2018 1:42 PM  Actions taken: Sign clinical note

## 2018-04-02 NOTE — Progress Notes (Signed)
LVAD Clinic Follow Up Note  HPI:  David [name] is a 52 y.o. male with history of ICD (09 PCI to LAD, mRCA, '10 PCI to Lcx and '12 mRCA), HFrEF (EF ~20% for >5 years), HLD and HTN. S/P LVAD HM-III 04/20/18.   Admitted 04/19/17 through 05/27/2017 for scheduled LVAD implant. On 12/27 he underwent HMIII LVAD implant. Hospital course complicated by PNA with achromobacter tracheal aspirate, shock liver, and AKI. Required short term dialysis.  Readmitted 05/30/17 with symptomatic anemia hgb 3.8.  INR 2.9. He was transfused with 4 units of packed red blood cell. 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  It was suspected AVMs were the likely cause of bleeding in the setting of anticoagulation.  He will need colonoscopy at some point, this can be done as outpatient. Discharged 06/04/17.  Follow up for Heart Failure/LVAD:  Presents for routine f/u with his wife. Continues to feel great (and root for [employer] basketball). Able to do all activities without any problem. Taking lasix 40mg  daily. Wife still struggling with her arthritis. Denies orthopnea or PND. No fevers, chills or problems with driveline. No bleeding, melena or neuro symptoms. No VAD alarms. Taking all meds as prescribed.    VAD Indication: Destination therapy-   CKD  VAD interrogation & Equipment Management:  VAD interrogated personally. Parameters stable. See VAD coordinator note for full details  Has one No external power alarm. - Reinforced not to double disconnect batteries.     Past Medical History:  Diagnosis Date  . AICD (automatic cardioverter/defibrillator) present   . Anemia   . Anxiety   . CAD (coronary artery disease) 2009   AMI in 12/2007 with PCI to LAD, staged PCI to  mid/distal RCA, NSTEMI in 02/2009 with BMS to LCx  . CHF (congestive heart failure) ([place])     . Chronic kidney disease 11/03/2016   stage 3 kidney disease  . Dysrhythmia    "arrythmia problems at some point", "fatal rhythms"  . History of blood transfusion    "I was bleeding from was rectum" (04/19/2017)  . HLD (hyperlipidemia)   . HTN (hypertension)   . Ischemic cardiomyopathy    Admitted in 07/2010 with CHF exacerbation  . Myocardial infarction ([place])    "I've had 4" (04/19/2017)  . Pneumonia 2018 X 1  . Seizures ([place])    one seizure in 04/2016 during cardiac event (04/19/2017)    Current Outpatient Medications  Medication Sig Dispense Refill  . atorvastatin (LIPITOR) 40 MG tablet TAKE 1 TABLET BY MOUTH ONCE DAILY AT  6  PM. 90 tablet 2  . furosemide (LASIX) 40 MG tablet Take 1 tablet (40 mg total) by mouth daily. KEEP OV. 90 tablet 3  . gabapentin (NEURONTIN) 300 MG capsule Take 1 capsule (300 mg total) by mouth 2 (two) times daily. 60 capsule 6  . losartan (COZAAR) 50 MG tablet Take 1 tablet (50 mg total) by mouth daily. 90 tablet 3  . Magnesium Oxide 400 MG CAPS Take 1 capsule (400 mg total) by mouth 3 (three) times daily. 90 capsule 6  . Multiple Vitamins-Minerals (ADULT GUMMY) CHEW Chew 2 tablets by mouth daily.    . pantoprazole (PROTONIX) 40 MG tablet Take 1 tablet (40 mg total) by mouth daily. 210 tablet 0  . potassium chloride SA (KLOR-CON M20) 20 MEQ tablet Take 1 tablet (20 mEq total) by mouth  daily. KEEP OV. 90 tablet 3  . traZODone (DESYREL) 50 MG tablet Take 1 tablet (50 mg total) by mouth at bedtime. 30 tablet 3  . warfarin (COUMADIN) 2 MG tablet Take 2 tablets (4 mg total) by mouth daily. 60 tablet 4  . traMADol (ULTRAM) 50 MG tablet Take 2 tablets (100 mg total) by mouth every 8 (eight) hours as needed for moderate pain. (Patient not taking: Reported on 10/03/2017) 120 tablet 0   No current facility-administered medications for this encounter.     Plavix [clopidogrel bisulfate]  Review of systems complete and found to be negative unless listed in HPI.      Vitals:   04/02/18 1052 04/02/18 1053  BP: (!) 86/0 117/72  Pulse: 96   SpO2: 95%   Weight: 79.5 kg (175 lb 3.2 oz)    Vital Signs:   MAP 86   Physical Exam: General:  NAD.  HEENT: normal  Neck: supple. JVP not elevated.  Carotids 2+ bilat; no bruits. No lymphadenopathy or thryomegaly appreciated. Cor: LVAD hum.  Lungs: Clear. Abdomensoft, nontender, non-distended. No hepatosplenomegaly. No bruits or masses. Good bowel sounds. Driveline site clean. Anchor in place.  Extremities: no cyanosis, clubbing, rash. Warm no edema  Neuro: alert & oriented x 3. No focal deficits. Moves all 4 without problem      ASSESSMENT AND PLAN:   1. Chronic systolic CHF with HM III LVAD implant 04/20/17:  - Continues to do extremely well with VAD. Stable NYHA I  - Volume status looks good. Continue lasix 40mg  daily.  - He has been seen at [facility] for transplant referral but we discussed again and he wants to hold off for now until his wife gets through her health issues/surgeries. We discussed making a visit in 2020 to see if he would qualify for listing.  2. 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 Goal INR still 2.0-2.5 - INR 1.75 today. Discussed dosing with PharmD personally. 3. H/O of AKI on CKD Stage III:  - Creatinine stable at 1.54 today (baseline 1.3 - 1.5) 4. VAD management - VAD interrogated personally. Parameters stable. Had one No external power alarm. - Reinforced not to double disconnect batteries.  - LDH 196 - INR goal 2.0-2.5 Off ASA due to GIB 2/19.  - INR 1.73 this am. Discussed dosing with PharmD personally. - MAPs look good. Continue losartan. Can increase as needed. 5, Chest/pocket pain - Resolved 6. PAF:  - Maintaining NSR. Continue warfarin 7. CAD:  s/p PCI RCA/PLOM with DES x2 and DES to mild LAD 1/18 - No s/s of ischemia  - Continue statin. Off ASA  due to GIB 8. Hypomagnesemia. - Continue magnesium oxide 400 TID. Recheck today.  Total time spent 35 minutes. Over half that time spent discussing above.    Glori Bickers, MD  12:10 PM  .

## 2018-04-02 NOTE — Progress Notes (Signed)
Patient presents for 2 month follow up in [facility] today. Reports no problems with VAD equipment or concerns with drive line. Pt feels "the best I've felt in a long time".   Wife is concerned that at times his VAD will alarm for a "split second" when attached to batteries, even though the batteries are full. Patient says it alarms when he is changing power sources, but denies "extra beeping." Upon interrogation, no alarms noted other than 1 no external power on 12/3. Patient states batteries are holding charge like normal. Will have patient change battery clips, and will order new clips.   Wife asked that VAD Coordinator perform dressing change today - see below.   1 year Intermacs follow up completed including:  Quality of Life, KCCQ-12, and Neurocognitive trail making.   Pt completed 1350 feet during 6 minute walk.  Back up controller:  11V backup battery charged during this visit.   Vital Signs:  Doppler Pressure: 86  Automatc BP: 117/72 (89)  HR: 96 SPO2: 95% RA  Weight: 175.2 lb w/oeqt Last weight: 172.8 lb Home weights: 168 - 169 lbs   VAD Indication: Destination therapy-   CKD  VAD interrogation & Equipment Management: Speed: 5400 Flow: 3.8 Power: 4.3 w    PI: 5.4  Alarms: 1 no external power Events:  10-20 PI events daily  Fixed speed 5400 Low speed limit: 5100  Primary Controller:  Replace back up battery in 24 months. Back up controller:   Replace back up battery in 24 months.  Annual Equipment Maintenance on UBC/PM was performed on 03/2017.   I reviewed the LVAD parameters from today and compared the results to the patient's prior recorded data. LVAD interrogation was NEGATIVE for significant power changes, NEGATIVE for clinical alarms and STABLE for PI events/speed drops. No programming changes were made and pump is functioning within specified parameters. Pt is performing daily controller and system monitor self tests along with completing weekly and  monthly maintenance for LVAD equipment.  LVAD equipment check completed and is in good working order. Back-up equipment present.    Exit Site Care: Drive line is being maintained weekly  by [name], his wife. Existing VAD dressing removed and site care performed using sterile technique. Drive line exit site cleaned with sterile saline and betadine, allowed to dry, and Sorbaview dressing with bio patch re-applied. Exit site healed and incorporated, the velour is fully implanted at exit site. No redness, drainage or foul odor noted. Drive line anchor re-applied. Pt denies fever or chills. Pt given 8 weekly dressings at this visit, 4 anchors, and 1 box of adhesive remover pads.     Significant Events on VAD Support:  none  Device:Medtronic Bi-V Therapies: on Last check: 05/30/2017   BP & Labs:  Doppler BP 86 - Doppler is reflecting MAP.  Hgb 16.0 - No S/S of bleeding. Specifically denies melena/BRBPR or nosebleeds.  LDH stable at 196with established baseline of 165- 250. Denies tea-colored urine. No power elevations noted on interrogation.    Patient Instructions:  1. Will order 4 new battery clips.  2. Return to [facility] in 2 months.  3. Bring equipment for annual maintenance to next visit.   Emerson Monte RN [place] Coordinator  Office: [phone]  24/7 Pager: [phone]

## 2018-04-03 ENCOUNTER — Encounter (HOSPITAL_COMMUNITY): Payer: Self-pay | Admitting: Unknown Physician Specialty

## 2018-04-06 ENCOUNTER — Other Ambulatory Visit (HOSPITAL_COMMUNITY): Payer: Self-pay | Admitting: *Deleted

## 2018-04-06 DIAGNOSIS — Z95811 Presence of heart assist device: Secondary | ICD-10-CM

## 2018-04-06 DIAGNOSIS — Z7901 Long term (current) use of anticoagulants: Secondary | ICD-10-CM

## 2018-04-09 ENCOUNTER — Ambulatory Visit (HOSPITAL_COMMUNITY): Payer: Self-pay | Admitting: Pharmacist

## 2018-04-09 DIAGNOSIS — Z95811 Presence of heart assist device: Secondary | ICD-10-CM

## 2018-04-09 LAB — POCT INR: INR: 2.7 (ref 2.0–3.0)

## 2018-04-16 ENCOUNTER — Ambulatory Visit (HOSPITAL_COMMUNITY): Payer: Self-pay | Admitting: Pharmacist

## 2018-04-16 DIAGNOSIS — Z95811 Presence of heart assist device: Secondary | ICD-10-CM

## 2018-04-16 LAB — POCT INR: INR: 1.8 — AB (ref 2.0–3.0)

## 2018-04-23 ENCOUNTER — Ambulatory Visit (HOSPITAL_COMMUNITY): Payer: Self-pay | Admitting: Pharmacist

## 2018-04-23 DIAGNOSIS — Z95811 Presence of heart assist device: Secondary | ICD-10-CM

## 2018-04-23 LAB — POCT INR: INR: 2.5 (ref 2.0–3.0)

## 2018-04-30 ENCOUNTER — Ambulatory Visit (HOSPITAL_COMMUNITY): Payer: Self-pay | Admitting: *Deleted

## 2018-04-30 DIAGNOSIS — Z95811 Presence of heart assist device: Secondary | ICD-10-CM

## 2018-04-30 LAB — POCT INR: INR: 2.6 (ref 2.0–3.0)

## 2018-05-07 ENCOUNTER — Ambulatory Visit (HOSPITAL_COMMUNITY): Payer: Self-pay | Admitting: Pharmacist

## 2018-05-07 DIAGNOSIS — Z95811 Presence of heart assist device: Secondary | ICD-10-CM

## 2018-05-07 LAB — POCT INR: INR: 2.6 (ref 2.0–3.0)

## 2018-05-14 ENCOUNTER — Ambulatory Visit (HOSPITAL_COMMUNITY): Payer: Self-pay | Admitting: Pharmacist

## 2018-05-14 DIAGNOSIS — Z95811 Presence of heart assist device: Secondary | ICD-10-CM

## 2018-05-14 LAB — POCT INR: INR: 2.4 (ref 2.0–3.0)

## 2018-05-21 ENCOUNTER — Ambulatory Visit (HOSPITAL_COMMUNITY): Payer: Self-pay | Admitting: Pharmacist

## 2018-05-21 DIAGNOSIS — Z95811 Presence of heart assist device: Secondary | ICD-10-CM

## 2018-05-21 LAB — POCT INR: INR: 2.9 (ref 2.0–3.0)

## 2018-05-28 LAB — POCT INR: INR: 2 (ref 2.0–3.0)

## 2018-05-29 ENCOUNTER — Ambulatory Visit (HOSPITAL_COMMUNITY): Payer: Self-pay | Admitting: Pharmacist

## 2018-06-01 ENCOUNTER — Other Ambulatory Visit (HOSPITAL_COMMUNITY): Payer: Self-pay | Admitting: Unknown Physician Specialty

## 2018-06-01 DIAGNOSIS — Z7901 Long term (current) use of anticoagulants: Secondary | ICD-10-CM

## 2018-06-01 DIAGNOSIS — Z95811 Presence of heart assist device: Secondary | ICD-10-CM

## 2018-06-04 ENCOUNTER — Ambulatory Visit (HOSPITAL_COMMUNITY): Payer: Self-pay | Admitting: Pharmacist

## 2018-06-04 ENCOUNTER — Telehealth (HOSPITAL_COMMUNITY): Payer: Self-pay | Admitting: Pharmacist

## 2018-06-04 ENCOUNTER — Inpatient Hospital Stay (HOSPITAL_COMMUNITY): Admission: RE | Admit: 2018-06-04 | Payer: Self-pay | Source: Ambulatory Visit

## 2018-06-04 DIAGNOSIS — Z95811 Presence of heart assist device: Secondary | ICD-10-CM

## 2018-06-04 LAB — POCT INR: INR: 2.9 (ref 2–3)

## 2018-06-04 NOTE — Telephone Encounter (Signed)
Left voicemail on David [name]'s phone, instructing David [name] to take 2mg  of warfarin tonight then resume home regimen and recheck INR next week. Instructed to call VAD pager for any issues or questions.

## 2018-06-11 ENCOUNTER — Ambulatory Visit (HOSPITAL_COMMUNITY): Payer: Self-pay | Admitting: Pharmacist

## 2018-06-11 ENCOUNTER — Encounter (HOSPITAL_COMMUNITY): Payer: Self-pay

## 2018-06-11 ENCOUNTER — Ambulatory Visit (HOSPITAL_COMMUNITY)
Admission: RE | Admit: 2018-06-11 | Discharge: 2018-06-11 | Disposition: A | Discharge status: Home / Self Care | Payer: Medicare HMO | Source: Ambulatory Visit | Attending: Internal Medicine | Admitting: Internal Medicine

## 2018-06-11 VITALS — BP 105/69 | HR 71 | Ht 65.0 in | Wt 177.2 lb

## 2018-06-11 DIAGNOSIS — R079 Chest pain, unspecified: Secondary | ICD-10-CM | POA: Insufficient documentation

## 2018-06-11 DIAGNOSIS — I252 Old myocardial infarction: Secondary | ICD-10-CM | POA: Insufficient documentation

## 2018-06-11 DIAGNOSIS — I5022 Chronic systolic (congestive) heart failure: Secondary | ICD-10-CM | POA: Diagnosis not present

## 2018-06-11 DIAGNOSIS — N179 Acute kidney failure, unspecified: Secondary | ICD-10-CM | POA: Insufficient documentation

## 2018-06-11 DIAGNOSIS — I2510 Atherosclerotic heart disease of native coronary artery without angina pectoris: Secondary | ICD-10-CM | POA: Diagnosis not present

## 2018-06-11 DIAGNOSIS — E785 Hyperlipidemia, unspecified: Secondary | ICD-10-CM | POA: Insufficient documentation

## 2018-06-11 DIAGNOSIS — Z95811 Presence of heart assist device: Secondary | ICD-10-CM

## 2018-06-11 DIAGNOSIS — I480 Paroxysmal atrial fibrillation: Secondary | ICD-10-CM

## 2018-06-11 DIAGNOSIS — D649 Anemia, unspecified: Secondary | ICD-10-CM | POA: Insufficient documentation

## 2018-06-11 DIAGNOSIS — I255 Ischemic cardiomyopathy: Secondary | ICD-10-CM | POA: Insufficient documentation

## 2018-06-11 DIAGNOSIS — F419 Anxiety disorder, unspecified: Secondary | ICD-10-CM | POA: Diagnosis not present

## 2018-06-11 DIAGNOSIS — Z95810 Presence of automatic (implantable) cardiac defibrillator: Secondary | ICD-10-CM | POA: Diagnosis not present

## 2018-06-11 DIAGNOSIS — Z955 Presence of coronary angioplasty implant and graft: Secondary | ICD-10-CM | POA: Insufficient documentation

## 2018-06-11 DIAGNOSIS — N183 Chronic kidney disease, stage 3 (moderate): Secondary | ICD-10-CM | POA: Diagnosis not present

## 2018-06-11 DIAGNOSIS — Z7901 Long term (current) use of anticoagulants: Secondary | ICD-10-CM

## 2018-06-11 DIAGNOSIS — Z79899 Other long term (current) drug therapy: Secondary | ICD-10-CM | POA: Insufficient documentation

## 2018-06-11 DIAGNOSIS — I130 Hypertensive heart and chronic kidney disease with heart failure and stage 1 through stage 4 chronic kidney disease, or unspecified chronic kidney disease: Secondary | ICD-10-CM | POA: Insufficient documentation

## 2018-06-11 LAB — BASIC METABOLIC PANEL
Anion gap: 12 (ref 5–15)
BUN: 10 mg/dL (ref 6–20)
CO2: 24 mmol/L (ref 22–32)
CREATININE: 1.32 mg/dL — AB (ref 0.61–1.24)
Calcium: 9.3 mg/dL (ref 8.9–10.3)
Chloride: 102 mmol/L (ref 98–111)
GFR calc Af Amer: >60 mL/min (ref >60)
GFR calc non Af Amer: >60 mL/min (ref >60)
Glucose, Bld: 91 mg/dL (ref 70–99)
Potassium: 3.8 mmol/L (ref 3.5–5.1)
Sodium: 138 mmol/L (ref 135–145)

## 2018-06-11 LAB — CBC
HCT: 50.1 % (ref 39.0–52.0)
HEMOGLOBIN: 14.9 g/dL (ref 13.0–17.0)
MCH: 24.6 pg — ABNORMAL LOW (ref 26.0–34.0)
MCHC: 29.7 g/dL — AB (ref 30.0–36.0)
MCV: 82.8 fL (ref 80.0–100.0)
Platelets: 231 10*3/uL (ref 150–400)
RBC: 6.05 MIL/uL — ABNORMAL HIGH (ref 4.22–5.81)
RDW: 14.6 % (ref 11.5–15.5)
WBC: 8.6 10*3/uL (ref 4.0–10.5)
nRBC: 0 % (ref 0.0–0.2)

## 2018-06-11 LAB — LACTATE DEHYDROGENASE: LDH: 197 U/L — ABNORMAL HIGH (ref 98–192)

## 2018-06-11 LAB — PROTIME-INR
INR: 3.3
Prothrombin Time: 33 seconds — ABNORMAL HIGH (ref 11.4–15.2)

## 2018-06-11 LAB — MAGNESIUM: Magnesium: 1.7 mg/dL (ref 1.7–2.4)

## 2018-06-11 NOTE — Progress Notes (Signed)
LVAD Clinic Follow Up Note  HPI:  David [name] is a 53 y.o. male with history of ICD (09 PCI to LAD, mRCA, '10 PCI to Lcx and '12 mRCA), HFrEF (EF ~20% for >5 years), HLD and HTN. S/P LVAD HM-III 04/20/18.   Admitted 04/19/17 through 05/27/2017 for scheduled LVAD implant. On 12/27 he underwent HMIII LVAD implant. Hospital course complicated by PNA with achromobacter tracheal aspirate, shock liver, and AKI. Required short term dialysis.  Readmitted 05/30/17 with symptomatic anemia hgb 3.8.  INR 2.9. He was transfused with 4 units of packed red blood cell. 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  It was suspected AVMs were the likely cause of bleeding in the setting of anticoagulation.  He will need colonoscopy at some point, this can be done as outpatient. Discharged 06/04/17.  Follow up for Heart Failure/LVAD:  Presents for routine f/u with his wife. Continues to feel great (and root for [employer] basketball unfortunately). Able to do all activities without difficulty. Very active. No CP or SOB. No edema. Denies orthopnea or PND. No fevers, chills or problems with driveline. No bleeding, melena or neuro symptoms. No VAD alarms. Taking all meds as prescribed. Has not been to [facility] yet for transplant eval.   VAD Indication: Destination therapy-   CKD  VAD interrogation & Equipment Management: Speed: 5400 Flow: 4.6  Power: 4.2 w PI: 5.1  Alarms: none Events:  10-20 PI events daily  Fixed speed 5400 Low speed limit: 5100  Primary Controller: Replace back up battery in 18 months. Back up controller: Replace back up battery in 48months.  Annual Equipment Maintenance on UBC/PM was performed on 05/2018.     Past Medical History:  Diagnosis Date  . AICD (automatic cardioverter/defibrillator) present   . Anemia   .  Anxiety   . CAD (coronary artery disease) 2009   AMI in 12/2007 with PCI to LAD, staged PCI to  mid/distal RCA, NSTEMI in 02/2009 with BMS to LCx  . CHF (congestive heart failure) ([place])   . Chronic kidney disease 11/03/2016   stage 3 kidney disease  . Dysrhythmia    "arrythmia problems at some point", "fatal rhythms"  . History of blood transfusion    "I was bleeding from was rectum" (04/19/2017)  . HLD (hyperlipidemia)   . HTN (hypertension)   . Ischemic cardiomyopathy    Admitted in 07/2010 with CHF exacerbation  . Myocardial infarction ([place])    "I've had 4" (04/19/2017)  . Pneumonia 2018 X 1  . Seizures ([place])    one seizure in 04/2016 during cardiac event (04/19/2017)    Current Outpatient Medications  Medication Sig Dispense Refill  . atorvastatin (LIPITOR) 40 MG tablet TAKE 1 TABLET BY MOUTH ONCE DAILY AT  6  PM. 90 tablet 2  . furosemide (LASIX) 40 MG tablet Take 1 tablet (40 mg total) by mouth daily. KEEP OV. 90 tablet 3  . gabapentin (NEURONTIN) 300 MG capsule Take 1 capsule (300 mg total) by mouth 2 (two) times daily. 60 capsule 6  . losartan (COZAAR) 50 MG tablet Take 1 tablet (50 mg total) by mouth daily. 90 tablet 3  . Multiple Vitamins-Minerals (ADULT GUMMY) CHEW Chew 2 tablets by mouth daily.    . pantoprazole (PROTONIX) 40 MG tablet Take 1 tablet (40 mg total) by mouth daily. 210 tablet 0  . potassium chloride SA (KLOR-CON  M20) 20 MEQ tablet Take 1 tablet (20 mEq total) by mouth daily. KEEP OV. 90 tablet 3  . traZODone (DESYREL) 50 MG tablet Take 1 tablet (50 mg total) by mouth at bedtime. 30 tablet 3  . warfarin (COUMADIN) 2 MG tablet Take 2 tablets (4 mg total) by mouth daily. 60 tablet 4  . Magnesium Oxide 400 MG CAPS Take 1 capsule (400 mg total) by mouth 3 (three) times daily. (Patient taking differently: Take 400 mg by mouth 2 (two) times daily. ) 90 capsule 6  . traMADol (ULTRAM) 50 MG tablet Take 2 tablets (100 mg total) by mouth every 8 (eight) hours as needed  for moderate pain. (Patient not taking: Reported on 10/03/2017) 120 tablet 0   No current facility-administered medications for this encounter.     Plavix [clopidogrel bisulfate]  Review of systems complete and found to be negative unless listed in HPI.     Vitals:   06/11/18 1033  BP: 105/69  Pulse: 71  SpO2: 96%  Weight: 80.4 kg (177 lb 3.2 oz)  Height: 5\' 5"  (1.651 m)   Vital Signs:  Doppler Pressure: 104  Automatc BP: 105/69 (88)  HR: 71 SPO2: 96% RA  Weight: 177.2 lb w/oeqt Last weight: 175.2 lb Home weights: 168 - 169 lbs   Physical Exam: General:  NAD.  HEENT: normal  Neck: supple. JVP 5-6  Carotids 2+ bilat; no bruits. No lymphadenopathy or thryomegaly appreciated. Cor: LVAD hum.  Lungs: Clear. Abdomen: obese soft, nontender, non-distended. No hepatosplenomegaly. No bruits or masses. Good bowel sounds. Driveline site clean. Anchor in place.  Extremities: no cyanosis, clubbing, rash. Warm no edema  Neuro: alert & oriented x 3. No focal deficits. Moves all 4 without problem    ASSESSMENT AND PLAN:   1. Chronic systolic CHF with HM III LVAD implant 04/20/17:  - Continues to do extremely well with VAD. - Stable NYHA I  - Volume status looks good. Continue lasix 40mg  daily.  - He has been seen at [facility] for transplant referral but we discussed again and he wants to hold off for now until his wife gets through her health issues/surgeries. We again discussed making a visit ithis year to see if he would qualify for listing.  2. 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 Goal INR still 2.0-2.5 - INR 3.30 today. Discussed dosing with PharmD personally. 3. H/O of AKI on CKD Stage III:  - Creatinine stable at 1.54 today (baseline 1.3 - 1.5) 4. VAD management - VAD interrogated personally. Parameters stable. Had one No external power alarm. -  Reinforced not to double disconnect batteries.  - LDH 197 - INR goal 2.0-2.5 Off ASA due to GIB 2/19.  - INR 3.30 this am. Discussed dosing with PharmD personally. - MAPs look good in 80s. Continue losartan. Can increase as needed. 5, Chest/pocket pain - Resolved 6. PAF:  - Maintaining NSR. Continue warfarin 7. CAD:  s/p PCI RCA/PLOM with DES x2 and DES to mild LAD 1/18 - No s/s of ischemia - Continue statin. Off ASA due to GIB 8. Hypomagnesemia. - Continue magnesium oxide 400 TID.   Total time spent 35 minutes. Over half that time spent discussing above.    Glori Bickers, MD  11:05 AM  .

## 2018-06-11 NOTE — Progress Notes (Signed)
Patient presents for 2 month follow up in [facility] today with his wife and daughter. Reports no problems with VAD equipment or concerns with drive line. .   Pt states that he feels great, his only compliant is that he is still having back pain. This is a chronic issue for him. Pt states that he takes Tylenol when he needs it. Pt instructed that Tylenol is ok but he may not take any type of Ibprofen. Pt verbalizes understanding.    Vital Signs:  Doppler Pressure: 104  Automatc BP: 105/69 (88)  HR: 71 SPO2: 96% RA  Weight: 177.2 lb w/oeqt Last weight: 175.2 lb Home weights: 168 - 169 lbs   VAD Indication: Destination therapy-   CKD  VAD interrogation & Equipment Management: Speed: 5400 Flow: 4.6  Power: 4.2 w    PI: 5.1  Alarms: none Events:  10-20 PI events daily  Fixed speed 5400 Low speed limit: 5100  Primary Controller:  Replace back up battery in 18 months. Back up controller:   Replace back up battery in 18 months.  Annual Equipment Maintenance on UBC/PM was performed on 05/2018.   I reviewed the LVAD parameters from today and compared the results to the patient's prior recorded data. LVAD interrogation was NEGATIVE for significant power changes, NEGATIVE for clinical alarms and STABLE for PI events/speed drops. No programming changes were made and pump is functioning within specified parameters. Pt is performing daily controller and system monitor self tests along with completing weekly and monthly maintenance for LVAD equipment.  LVAD equipment check completed and is in good working order. Back-up equipment present.    Exit Site Care: Drive line is being maintained weekly  by [name], his wife. Existing VAD dressing removed and site care performed using sterile technique. Drive line exit site cleaned with sterile saline ONLY, allowed to dry, and Sorbaview dressing with NO bio patch. Exit site healed and incorporated, the velour is fully implanted at exit site.  No redness, drainage or foul odor noted. Drive line anchor re-applied. Pt denies fever or chills. Pt given 10 weekly dressings at this visit.  Significant Events on VAD Support:  none  Device:Medtronic Bi-V Therapies: on Last check: 05/30/2017   BP & Labs:  Doppler BP 104 - Doppler is reflecting Modified systolic.  Hgb 14.9 - No S/S of bleeding. Specifically denies melena/BRBPR or nosebleeds.  LDH stable at 197with established baseline of 165- 250. Denies tea-colored urine. No power elevations noted on interrogation.   Reviewed and demonstrated the following to patient and caregiver:               Reviewed the steps for replacing the running system controller with the back-up system controller (see patient handbook section 2 or the appropriate pamphlet).             X  Demonstrated (using the mock-driveline and controller) how to connect and disconnect the mock-driveline in back up system controller in a timely manner (less than 10 seconds) with return demonstration by patient and caregiver.              X   Reviewed system controller alarms and troubleshooting including hazard and advisory alarms and accessing alarm history. Re-enforced NOT TO attempt to perform any task displayed on the display screen alone or without calling the VAD pager [phone] (see patient handbook section 5 or the appropriate pamphlet).  X  Reviewed how to handle an emergency including when the pump is running and when the pump has stopped (see patient handbook section 8). Call 911 first and then the VAD pager at [phone].             X   Reviewed 14-volt lithium ion battery calibration steps (see patient handbook section 3).            X   Reviewed contents of black bag and what must be with patient at all times.            X  Reviewed driveline exit site including cleansing, dressing, and immobilizing with an anchor device to prevent exit site trauma.             X   Reviewed weekly maintenance which includes: Reviewing "Replacing the [place] with a [employer]" pamphlet. Clean batteries, clips, and battery charger contacts.  Check cables for damages. Rotate batteries; keep all 8 charged.              X  Reviewed monthly maintenance which includes: Reviewing Alarms and Troubleshooting. Check battery manufacturer dates. Check use/charge cycles for each battery; remember to re-calibrate every 70 uses when prompted.              X  Additional pamphlets Guide to Replacing the [place] with the [place] for Patients and Their Caregivers and HM II Alarms for Patients and Their Caregivers given to patient.              X   All of pts batteries need to be recaliberated. Pt was re educated on this.  Annual maintenance completed per [employer] on patient's home mobile power unit and Electrical engineer.    Backup system controller 11 volt battery charged during visit.   Patient Instructions:  1. Coumadin changes per pharmacy. 2. Return to clinic in 2 months.  Tanda Rockers RN [place] Coordinator  Office: [phone]  24/7 Pager: [phone]

## 2018-06-18 ENCOUNTER — Ambulatory Visit (HOSPITAL_COMMUNITY): Payer: Self-pay | Admitting: Pharmacist

## 2018-06-18 DIAGNOSIS — Z95811 Presence of heart assist device: Secondary | ICD-10-CM

## 2018-06-18 LAB — POCT INR: INR: 2 (ref 2.0–3.0)

## 2018-06-25 ENCOUNTER — Ambulatory Visit (HOSPITAL_COMMUNITY): Payer: Self-pay | Admitting: Pharmacist

## 2018-06-25 DIAGNOSIS — Z95811 Presence of heart assist device: Secondary | ICD-10-CM

## 2018-06-25 LAB — POCT INR: INR: 2.1 (ref 2.0–3.0)

## 2018-07-02 ENCOUNTER — Ambulatory Visit (HOSPITAL_COMMUNITY): Payer: Self-pay | Admitting: Pharmacist

## 2018-07-02 DIAGNOSIS — Z95811 Presence of heart assist device: Secondary | ICD-10-CM

## 2018-07-02 LAB — POCT INR: INR: 3.9 — AB (ref 2.0–3.0)

## 2018-07-09 ENCOUNTER — Ambulatory Visit (HOSPITAL_COMMUNITY): Payer: Self-pay | Admitting: Pharmacist

## 2018-07-09 DIAGNOSIS — Z95811 Presence of heart assist device: Secondary | ICD-10-CM

## 2018-07-09 LAB — POCT INR: INR: 2.7 (ref 2.0–3.0)

## 2018-07-10 ENCOUNTER — Telehealth (HOSPITAL_COMMUNITY): Payer: Self-pay | Admitting: Licensed Clinical Social Worker

## 2018-07-10 NOTE — Telephone Encounter (Signed)
CSW contacted patient to assure food, medications and confirmation of VAD pager number if concerns or emergency arise. Patient instructed to stay home and use of proper hygiene and call if needed.  Patient informed that VAD team will call the day before any upcoming appointments with instructions due to current CoVid 19 outbreak. Patient verbalizes understanding and denies any current concerns. [name], LCSW, CCSW-MCS [phone]  

## 2018-07-16 ENCOUNTER — Ambulatory Visit (HOSPITAL_COMMUNITY): Payer: Self-pay | Admitting: Pharmacist

## 2018-07-16 DIAGNOSIS — Z95811 Presence of heart assist device: Secondary | ICD-10-CM

## 2018-07-16 LAB — POCT INR: INR: 3.5 — AB (ref 2.0–3.0)

## 2018-07-16 MED ORDER — WARFARIN SODIUM 2 MG PO TABS: 4.0000 mg | ORAL_TABLET | Freq: Every day | ORAL | 4 refills | Status: DC

## 2018-07-23 LAB — POCT INR: INR: 2.8 (ref 2–3)

## 2018-07-24 ENCOUNTER — Ambulatory Visit (HOSPITAL_COMMUNITY): Payer: Self-pay | Admitting: Pharmacist

## 2018-07-24 DIAGNOSIS — Z95811 Presence of heart assist device: Secondary | ICD-10-CM

## 2018-07-25 ENCOUNTER — Telehealth (HOSPITAL_COMMUNITY): Payer: Self-pay | Admitting: Licensed Clinical Social Worker

## 2018-07-25 NOTE — Telephone Encounter (Signed)
CSW contacted caregiver to follow up on status with coronavirus and if anything needed. Patient instructed to stay home and use of proper hygiene and call if needed. Patient denies any concerns at this time and verbalizes understanding of process for contacting VAD Coordinators if needed. CSW continues to follow as needed. David [name], [place], [place]

## 2018-07-27 ENCOUNTER — Telehealth (HOSPITAL_COMMUNITY): Payer: Self-pay | Admitting: *Deleted

## 2018-07-27 NOTE — Telephone Encounter (Signed)
Wife has called Heart Failure clinic and left message re: potassium. Emerson Monte, VAD Coordinator returned call yesterday and left message.   Called wife again today and left message asking her to call VAD office at [phone] and leave detailed description of what she needs.  Zada Girt RN, VAD Coordinator.

## 2018-07-30 ENCOUNTER — Ambulatory Visit (HOSPITAL_COMMUNITY): Payer: Self-pay | Admitting: Pharmacist

## 2018-07-30 DIAGNOSIS — Z95811 Presence of heart assist device: Secondary | ICD-10-CM

## 2018-07-30 LAB — POCT INR: INR: 4.2 — AB (ref 2.0–3.0)

## 2018-08-06 ENCOUNTER — Ambulatory Visit (HOSPITAL_COMMUNITY): Payer: Self-pay | Admitting: Pharmacist

## 2018-08-06 DIAGNOSIS — Z95811 Presence of heart assist device: Secondary | ICD-10-CM

## 2018-08-06 LAB — POCT INR: INR: 2.7 (ref 2.0–3.0)

## 2018-08-09 ENCOUNTER — Telehealth (HOSPITAL_COMMUNITY): Payer: Self-pay | Admitting: *Deleted

## 2018-08-09 NOTE — Telephone Encounter (Signed)
Called pts' wife re: upcoming VAD clinic appt scheduled 08/13/18. She says they are concerned about patient coming to hospital and prefer to stay home. She says patient is doing fine and will contact us if any issues. Appt rescheduled; dressing kits mailed to patients' home Tues of this week. Informed her to call us if kits don't arrive.  Zada Girt RN, [place] Coordinator [phone]

## 2018-08-13 ENCOUNTER — Encounter (HOSPITAL_COMMUNITY): Payer: Self-pay

## 2018-08-13 ENCOUNTER — Ambulatory Visit (HOSPITAL_COMMUNITY): Payer: Self-pay | Admitting: Pharmacist

## 2018-08-13 ENCOUNTER — Encounter (HOSPITAL_COMMUNITY): Payer: Self-pay | Admitting: *Deleted

## 2018-08-13 ENCOUNTER — Ambulatory Visit (HOSPITAL_COMMUNITY)
Admission: RE | Admit: 2018-08-13 | Discharge: 2018-08-13 | Disposition: A | Discharge status: Home / Self Care | Payer: Medicare HMO | Source: Ambulatory Visit | Attending: Cardiology | Admitting: Cardiology

## 2018-08-13 VITALS — Wt 175.0 lb

## 2018-08-13 DIAGNOSIS — Z95811 Presence of heart assist device: Secondary | ICD-10-CM

## 2018-08-13 DIAGNOSIS — I5022 Chronic systolic (congestive) heart failure: Secondary | ICD-10-CM | POA: Diagnosis not present

## 2018-08-13 DIAGNOSIS — Z7901 Long term (current) use of anticoagulants: Secondary | ICD-10-CM | POA: Diagnosis not present

## 2018-08-13 DIAGNOSIS — I255 Ischemic cardiomyopathy: Secondary | ICD-10-CM

## 2018-08-13 LAB — POCT INR: INR: 3.7 — AB (ref 2.0–3.0)

## 2018-08-13 NOTE — Addendum Note (Signed)
Encounter addended by: Valeda Malm, RN on: 08/13/2018 10:49 AM  Actions taken: Clinical Note Signed

## 2018-08-13 NOTE — Progress Notes (Signed)
Heart Failure TeleHealth Note  Due to national recommendations of social distancing due to [place] 19, Audio/video telehealth visit is felt to be most appropriate for this patient at this time.  See MyChart message from today for patient consent regarding telehealth for [facility].  Date:  08/13/2018   ID:  David [name], DOB 1965-10-07, MRN [id number]  Location: Home  Provider location: [place] [facility] Type of Visit: Established patient   PCP:  Raina Mina., MD  Cardiologist:  Glori Bickers, MD Primary HF: Dr Haroldine Laws Chief Complaint:  Heart  Failure/LVAD    History of Present Illness: David [name] is a 53 y.o. male with a history of ICD (09 PCI to LAD, mRCA, '10 PCI to Lcx and '12 mRCA), HFrEF (EF ~20% for >5 years), HLD and HTN. S/P LVAD HM-III 04/20/18.   Admitted 04/19/17 through 05/27/2017 for scheduled LVAD implant. On 12/27 he underwent HMIII LVAD implant. Hospital course complicated by PNA with achromobacter tracheal aspirate, shock liver, and AKI. Required short term dialysis.  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 It was suspected AVMs were the likely cause of bleeding in the setting of anticoagulation. He will need colonoscopy at some point, this can be done as outpatient.  He presents via Engineer, civil (consulting) for a telehealth visit today.   Overall feeling fine. Denies SOB/PND/Orthopnea. SOB if he walks quickly up hills. No BRBPR. Appetite ok. No fever or chills. Weight at home 175 pounds. Taking all medications.   he denies symptoms worrisome for COVID 19.   LVAD Interrogation HM III: Speed: 5400  Flow: 4.6  PI:  4.2Power 4.2. no alarms   Past Medical History:  Diagnosis Date  . AICD (automatic cardioverter/defibrillator) present   . Anemia   . Anxiety   . CAD  (coronary artery disease) 2009   AMI in 12/2007 with PCI to LAD, staged PCI to  mid/distal RCA, NSTEMI in 02/2009 with BMS to LCx  . CHF (congestive heart failure) ([place])   . Chronic kidney disease 11/03/2016   stage 3 kidney disease  . Dysrhythmia    "arrythmia problems at some point", "fatal rhythms"  . History of blood transfusion    "I was bleeding from was rectum" (04/19/2017)  . HLD (hyperlipidemia)   . HTN (hypertension)   . Ischemic cardiomyopathy    Admitted in 07/2010 with CHF exacerbation  . Myocardial infarction ([place])    "I've had 4" (04/19/2017)  . Pneumonia 2018 X 1  . Seizures ([place])    one seizure in 04/2016 during cardiac event (04/19/2017)   Past Surgical History:  Procedure Laterality Date  . AORTIC VALVE REPAIR N/A 04/20/2017   Procedure: AORTIC VALVE REPAIR;  Surgeon: Gaye Pollack, MD;  Location: [facility];  Service: Open Heart Surgery;  Laterality: N/A;  . BIV ICD INSERTION CRT-D N/A 08/01/2016   Procedure: BiV ICD Insertion CRT-D;  Surgeon: Will Meredith Leeds, MD;  Location: [place];  Service: Cardiovascular;  Laterality: N/A;  . CARDIAC CATHETERIZATION N/A 05/05/2016   Procedure: Right/Left Heart Cath and Coronary Angiography;  Surgeon: Jolaine Artist, MD;  Location: [place] [facility];  Service: Cardiovascular;  Laterality: N/A;  . CARDIAC CATHETERIZATION N/A 05/09/2016   Procedure: Coronary Stent Intervention;  Surgeon: Peter M Martinique, MD;  Location: [place] [facility];  Service: Cardiovascular;  Laterality: N/A;  . CARDIAC CATHETERIZATION N/A 05/09/2016   Procedure: Intravascular Pressure Wire/FFR Study;  Surgeon: Peter M Martinique, MD;  Location: [place] [facility];  Service: Cardiovascular;  Laterality: N/A;  . CORONARY ANGIOPLASTY WITH STENT PLACEMENT     "i've got a total of 12 stents" (04/19/2017)  . ELECTROPHYSIOLOGY STUDY N/A 06/24/2016   Procedure: Electrophysiology Study;  Surgeon: Deboraha Sprang, MD;  Location: [place] [facility];  Service:  Cardiovascular;  Laterality: N/A;  . ENTEROSCOPY N/A 06/03/2017   Procedure: ENTEROSCOPY;  Surgeon: Yetta Flock, MD;  Location: [facility] ENDOSCOPY;  Service: Gastroenterology;  Laterality: N/A;  . EPICARDIAL PACING LEAD PLACEMENT Left 11/07/2016   Procedure: LV EPICARDIAL PACING LEAD PLACEMENT VIA LEFT MINI THORACOTOMY;  Surgeon: Gaye Pollack, MD;  Location: [place];  Service: Thoracic;  Laterality: Left;  . ICD IMPLANT N/A 06/24/2016   Procedure: POSSIBLE  ICD Implant;  Surgeon: Deboraha Sprang, MD;  Location: [place] [facility];  Service: Cardiovascular;  Laterality: N/A;  . INSERTION OF IMPLANTABLE LEFT VENTRICULAR ASSIST DEVICE N/A 04/20/2017   Procedure: INSERTION OF IMPLANTABLE LEFT VENTRICULAR ASSIST DEVICE, Aortic Valve repair;  Surgeon: Gaye Pollack, MD;  Location: [facility];  Service: Open Heart Surgery;  Laterality: N/A;  Heartmate 3  . IR FLUORO GUIDE CV LINE RIGHT  05/08/2017  . IR FLUORO GUIDE CV MIDLINE PICC RIGHT  03/14/2017  . IR REMOVAL TUN CV CATH W/O FL  05/25/2017  . IR US GUIDE VASC ACCESS RIGHT  03/14/2017  . IR US GUIDE VASC ACCESS RIGHT  05/08/2017  . LEFT HEART CATHETERIZATION WITH CORONARY ANGIOGRAM N/A 03/13/2011   Procedure: LEFT HEART CATHETERIZATION WITH CORONARY ANGIOGRAM;  Surgeon: Lorretta Harp, MD;  Location: [facility] CATH LAB;  Service: Cardiovascular;  Laterality: N/A;  . MULTIPLE EXTRACTIONS WITH ALVEOLOPLASTY N/A 04/12/2017   Procedure: Extraction of tooth #'s 2, 5-12, 17, 20-29 with alveoloplasty amd maxillary right buccal exostoses reductions.;  Surgeon: Lenn Cal, DDS;  Location: [facility];  Service: Oral Surgery;  Laterality: N/A;  . RIGHT HEART CATH N/A 04/19/2017   Procedure: RIGHT HEART CATH;  Surgeon: Jolaine Artist, MD;  Location: [place] CV [facility];  Service: Cardiovascular;  Laterality: N/A;  . RIGHT/LEFT HEART CATH AND CORONARY ANGIOGRAPHY N/A 03/10/2017   Procedure: RIGHT/LEFT HEART CATH AND CORONARY ANGIOGRAPHY;  Surgeon: Jolaine Artist,  MD;  Location: [place] [facility];  Service: Cardiovascular;  Laterality: N/A;  . TEE WITHOUT CARDIOVERSION N/A 04/20/2017   Procedure: TRANSESOPHAGEAL ECHOCARDIOGRAM (TEE);  Surgeon: Gaye Pollack, MD;  Location: [place];  Service: Open Heart Surgery;  Laterality: N/A;     Current Outpatient Medications  Medication Sig Dispense Refill  . atorvastatin (LIPITOR) 40 MG tablet TAKE 1 TABLET BY MOUTH ONCE DAILY AT  6  PM. 90 tablet 2  . furosemide (LASIX) 40 MG tablet Take 1 tablet (40 mg total) by mouth daily. KEEP OV. 90 tablet 3  . gabapentin (NEURONTIN) 300 MG capsule Take 1 capsule (300 mg total) by mouth 2 (two) times daily. 60 capsule 6  . losartan (COZAAR) 50 MG tablet Take 1 tablet (50 mg total) by mouth daily. 90 tablet 3  . Magnesium Oxide 400 MG CAPS Take 1 capsule (400 mg total) by mouth 3 (three) times daily. (Patient taking differently: Take 400 mg by mouth 2 (two) times daily. ) 90 capsule 6  . Multiple Vitamins-Minerals (ADULT GUMMY) CHEW Chew 2 tablets by mouth daily.    . pantoprazole (  PROTONIX) 40 MG tablet Take 1 tablet (40 mg total) by mouth daily. 210 tablet 0  . potassium chloride SA (KLOR-CON M20) 20 MEQ tablet Take 1 tablet (20 mEq total) by mouth daily. KEEP OV. 90 tablet 3  . traZODone (DESYREL) 50 MG tablet Take 1 tablet (50 mg total) by mouth at bedtime. 30 tablet 3  . warfarin (COUMADIN) 2 MG tablet Take 2 tablets (4 mg total) by mouth daily. Or as directed (Patient taking differently: Take 4 mg by mouth daily. 4mg  daily except 2mg  onTues Or as directed) 75 tablet 4   No current facility-administered medications for this encounter.     Allergies:   Plavix [clopidogrel bisulfate]   Social History:  The patient  reports that he quit smoking about 2 years ago. His smoking use included cigarettes and cigars. He has a 21.50 pack-year smoking history. He has never used smokeless tobacco. He reports that he does not drink alcohol or use drugs.   Family History:  The  patient's family history includes Coronary artery disease in his brother, father, mother, and unknown relative; Diabetes in his unknown relative; Emphysema in his brother and mother; Hypertension in his daughter, father, mother, sister, and unknown relative; Obesity in his daughter.   ROS:  Please see the history of present illness.   All other systems are personally reviewed and negative.   Exam:  Video Health Call; Exam is subjective  General:  Speaks in full sentences. No resp difficulty. Lungs: Normal respiratory effort with conversation.  Abdomen: Non-distended per patient report. Driveline site ok. Dressing dry and intact. No erythema or drainage Extremities: Pt denies edema. Neuro: Alert & oriented x 3.   Recent Labs: 04/02/2018: ALT 29 06/11/2018: BUN 10; Creatinine, Ser 1.32; Hemoglobin 14.9; Magnesium 1.7; Platelets 231; Potassium 3.8; Sodium 138  Personally reviewed   Wt Readings from Last 3 Encounters:  08/13/18 79.4 kg (175 lb)  06/11/18 80.4 kg (177 lb 3.2 oz)  04/02/18 79.5 kg (175 lb 3.2 oz)      ASSESSMENT AND PLAN:  1. Chronic systolic CHF with HM III LVAD implant 04/20/17:  - Continues to do extremely well with VAD. - Stable NYHA II. Volume status stable. Continue lasix 40mg  daily.  - He has been [facility] for transplant referral but we discussed again and he wants to hold off for now until his wife gets through her health issues/surgeries.  We again discussed making a visit ithis year to see if he would qualify for listing.  2.  VAD management - Continue VAD dressings weekly by his wife.  - VAD numbers reviewed with him. Appears stable. No alarms.  - INR goal 2.0-2.5.  INR 3.7. Personally discussed with pharmacy.  -Off ASA due to GIB 2/19.  3. Symptomatic anemia to UGI AVM bleed - s/p APC x 4 lesions in 2/19 -  Continue PPI - No s/s bleeding.  4. H/O of AKI on CKD Stage III:  Creatinine baseline 1.3 - 1.5.  Check BMET next visit.  5. PAF:  -  Maintaining NSR. Continue warfarin 6. CAD: s/p PCI RCA/PLOM with DES x2 and DES to mild LAD 1/18 - No s/s of ischemia - Continue statin.Off ASA due to GIB 7. Hypomagnesemia. - Continue magnesium oxide 400 twice day.   COVID screen The patient does not have any symptoms that suggest any further testing/ screening at this time.  Social distancing reinforced today.  Patient Risk: After full review of this patients clinical status, I feel  that they are at moderate risk for cardiac decompensation at this time.  Relevant cardiac medications were reviewed at length with the patient today. The patient does not have concerns regarding their medications at this time.   The following changes were made today:  None   Recommended follow-up:  Follow up in 2 months. I personally called and discussed the above with VAD coordinator.   Today, I have spent 25 minutes with the patient with telehealth technology discussing the above issues .    Jeanmarie Hubert, NP  08/13/2018 10:16 AM  [place] [address] [facility] [place] [address] [phone] (office) [phone] (fax)

## 2018-08-13 NOTE — Patient Instructions (Addendum)
Follow up in 2 months for follow up.

## 2018-08-21 ENCOUNTER — Ambulatory Visit (HOSPITAL_COMMUNITY): Payer: Self-pay | Admitting: Pharmacist

## 2018-08-21 DIAGNOSIS — Z95811 Presence of heart assist device: Secondary | ICD-10-CM

## 2018-08-21 LAB — POCT INR: INR: 2.8 (ref 2.0–3.0)

## 2018-08-24 ENCOUNTER — Telehealth (HOSPITAL_COMMUNITY): Payer: Self-pay | Admitting: Licensed Clinical Social Worker

## 2018-08-24 NOTE — Telephone Encounter (Signed)
CSW contacted patient to assure food, medications and confirmation of VAD pager number if concerns or emergency arise. Patient instructed to stay home and use of proper hygiene and call if needed.  Patient informed that VAD team will call the day before any upcoming appointments with instructions due to current Co[place] 19 outbreak. Patient verbalizes understanding and denies any current concerns. Jackie Derricka Mertz, LCSW, CCSW-MCS [phone]  

## 2018-08-27 ENCOUNTER — Ambulatory Visit (HOSPITAL_COMMUNITY): Payer: Self-pay | Admitting: Pharmacist

## 2018-08-27 DIAGNOSIS — Z95811 Presence of heart assist device: Secondary | ICD-10-CM

## 2018-08-27 LAB — POCT INR: INR: 1.9 — AB (ref 2.0–3.0)

## 2018-09-03 ENCOUNTER — Other Ambulatory Visit (HOSPITAL_COMMUNITY): Payer: Self-pay | Admitting: *Deleted

## 2018-09-03 ENCOUNTER — Ambulatory Visit (HOSPITAL_COMMUNITY): Payer: Self-pay | Admitting: Pharmacist

## 2018-09-03 ENCOUNTER — Other Ambulatory Visit (HOSPITAL_COMMUNITY): Payer: Self-pay | Admitting: Internal Medicine

## 2018-09-03 DIAGNOSIS — Z95811 Presence of heart assist device: Secondary | ICD-10-CM

## 2018-09-03 DIAGNOSIS — G629 Polyneuropathy, unspecified: Secondary | ICD-10-CM

## 2018-09-03 LAB — POCT INR: INR: 1.9 — AB (ref 2.0–3.0)

## 2018-09-03 MED ORDER — GABAPENTIN 300 MG PO CAPS: 300.0000 mg | ORAL_CAPSULE | Freq: Two times a day (BID) | ORAL | 6 refills | Status: DC

## 2018-09-10 ENCOUNTER — Ambulatory Visit (HOSPITAL_COMMUNITY): Payer: Self-pay | Admitting: Pharmacist

## 2018-09-10 DIAGNOSIS — Z95811 Presence of heart assist device: Secondary | ICD-10-CM

## 2018-09-10 LAB — POCT INR: INR: 1.7 — AB (ref 2.0–3.0)

## 2018-09-11 ENCOUNTER — Encounter (HOSPITAL_COMMUNITY): Payer: Self-pay

## 2018-09-17 LAB — POCT INR: INR: 2.1 (ref 2.0–3.0)

## 2018-09-18 ENCOUNTER — Ambulatory Visit (HOSPITAL_COMMUNITY): Payer: Self-pay | Admitting: Pharmacist

## 2018-09-18 DIAGNOSIS — Z95811 Presence of heart assist device: Secondary | ICD-10-CM

## 2018-09-23 ENCOUNTER — Other Ambulatory Visit (HOSPITAL_COMMUNITY): Payer: Self-pay | Admitting: Internal Medicine

## 2018-09-24 ENCOUNTER — Ambulatory Visit (HOSPITAL_COMMUNITY): Payer: Self-pay | Admitting: Pharmacist

## 2018-09-24 ENCOUNTER — Other Ambulatory Visit (HOSPITAL_COMMUNITY): Payer: Self-pay | Admitting: *Deleted

## 2018-09-24 ENCOUNTER — Telehealth (HOSPITAL_COMMUNITY): Payer: Self-pay | Admitting: Licensed Clinical Social Worker

## 2018-09-24 DIAGNOSIS — G4700 Insomnia, unspecified: Secondary | ICD-10-CM

## 2018-09-24 DIAGNOSIS — Z95811 Presence of heart assist device: Secondary | ICD-10-CM

## 2018-09-24 LAB — POCT INR: INR: 2.2 (ref 2.0–3.0)

## 2018-09-24 MED ORDER — TRAZODONE HCL 50 MG PO TABS: 50.0000 mg | ORAL_TABLET | Freq: Every day | ORAL | 5 refills | Status: DC

## 2018-09-24 NOTE — Telephone Encounter (Signed)
CSW contacted patient/caregiver to follow up on status with coronavirus and if anything needed. Patient instructed to stay home and use of proper hygiene and call if needed. CSW shared information about LVAD Support Group meeting virtually today if interested. Patient denies any concerns at this time and verbalizes understanding of process for contacting VAD Coordinators if needed. CSW continues to follow as needed. Raquel Sarna, [place], [place]

## 2018-10-04 ENCOUNTER — Ambulatory Visit (HOSPITAL_COMMUNITY): Payer: Self-pay | Admitting: Pharmacist

## 2018-10-04 DIAGNOSIS — Z95811 Presence of heart assist device: Secondary | ICD-10-CM

## 2018-10-04 LAB — POCT INR: INR: 1.7 — AB (ref 2.0–3.0)

## 2018-10-11 ENCOUNTER — Ambulatory Visit (HOSPITAL_COMMUNITY): Payer: Self-pay | Admitting: Pharmacist

## 2018-10-11 LAB — POCT INR: INR: 2.8 (ref 2–3)

## 2018-10-12 ENCOUNTER — Other Ambulatory Visit (HOSPITAL_COMMUNITY): Payer: Self-pay | Admitting: *Deleted

## 2018-10-12 DIAGNOSIS — Z7901 Long term (current) use of anticoagulants: Secondary | ICD-10-CM

## 2018-10-12 DIAGNOSIS — Z95811 Presence of heart assist device: Secondary | ICD-10-CM

## 2018-10-15 ENCOUNTER — Other Ambulatory Visit: Payer: Self-pay

## 2018-10-15 ENCOUNTER — Ambulatory Visit (HOSPITAL_COMMUNITY)
Admission: RE | Admit: 2018-10-15 | Discharge: 2018-10-15 | Disposition: A | Discharge status: Home / Self Care | Payer: Medicare HMO | Source: Ambulatory Visit | Attending: Cardiology | Admitting: Cardiology

## 2018-10-15 ENCOUNTER — Other Ambulatory Visit (HOSPITAL_COMMUNITY): Payer: Self-pay | Admitting: Unknown Physician Specialty

## 2018-10-15 ENCOUNTER — Ambulatory Visit (HOSPITAL_COMMUNITY): Payer: Self-pay | Admitting: Pharmacist

## 2018-10-15 ENCOUNTER — Other Ambulatory Visit (HOSPITAL_COMMUNITY)
Admission: RE | Admit: 2018-10-15 | Discharge: 2018-10-15 | Disposition: A | Discharge status: Home / Self Care | Payer: Medicare HMO | Source: Ambulatory Visit | Attending: Internal Medicine | Admitting: Internal Medicine

## 2018-10-15 DIAGNOSIS — I255 Ischemic cardiomyopathy: Secondary | ICD-10-CM | POA: Diagnosis not present

## 2018-10-15 DIAGNOSIS — I130 Hypertensive heart and chronic kidney disease with heart failure and stage 1 through stage 4 chronic kidney disease, or unspecified chronic kidney disease: Secondary | ICD-10-CM | POA: Diagnosis not present

## 2018-10-15 DIAGNOSIS — I2510 Atherosclerotic heart disease of native coronary artery without angina pectoris: Secondary | ICD-10-CM | POA: Insufficient documentation

## 2018-10-15 DIAGNOSIS — N179 Acute kidney failure, unspecified: Secondary | ICD-10-CM | POA: Diagnosis not present

## 2018-10-15 DIAGNOSIS — Z95811 Presence of heart assist device: Secondary | ICD-10-CM

## 2018-10-15 DIAGNOSIS — N183 Chronic kidney disease, stage 3 (moderate): Secondary | ICD-10-CM | POA: Insufficient documentation

## 2018-10-15 DIAGNOSIS — Z7901 Long term (current) use of anticoagulants: Secondary | ICD-10-CM | POA: Insufficient documentation

## 2018-10-15 DIAGNOSIS — E785 Hyperlipidemia, unspecified: Secondary | ICD-10-CM | POA: Diagnosis not present

## 2018-10-15 DIAGNOSIS — I5022 Chronic systolic (congestive) heart failure: Secondary | ICD-10-CM | POA: Insufficient documentation

## 2018-10-15 DIAGNOSIS — F419 Anxiety disorder, unspecified: Secondary | ICD-10-CM | POA: Diagnosis not present

## 2018-10-15 DIAGNOSIS — Z1159 Encounter for screening for other viral diseases: Secondary | ICD-10-CM | POA: Insufficient documentation

## 2018-10-15 DIAGNOSIS — Z955 Presence of coronary angioplasty implant and graft: Secondary | ICD-10-CM | POA: Insufficient documentation

## 2018-10-15 DIAGNOSIS — Z79899 Other long term (current) drug therapy: Secondary | ICD-10-CM | POA: Diagnosis not present

## 2018-10-15 DIAGNOSIS — I480 Paroxysmal atrial fibrillation: Secondary | ICD-10-CM | POA: Diagnosis not present

## 2018-10-15 DIAGNOSIS — I252 Old myocardial infarction: Secondary | ICD-10-CM | POA: Diagnosis not present

## 2018-10-15 LAB — CBC
HCT: 48.4 % (ref 39.0–52.0)
Hemoglobin: 15 g/dL (ref 13.0–17.0)
MCH: 24.5 pg — ABNORMAL LOW (ref 26.0–34.0)
MCHC: 31 g/dL (ref 30.0–36.0)
MCV: 79.1 fL — ABNORMAL LOW (ref 80.0–100.0)
Platelets: 193 10*3/uL (ref 150–400)
RBC: 6.12 MIL/uL — ABNORMAL HIGH (ref 4.22–5.81)
RDW: 15.3 % (ref 11.5–15.5)
WBC: 9.1 10*3/uL (ref 4.0–10.5)
nRBC: 0 % (ref 0.0–0.2)

## 2018-10-15 LAB — COMPREHENSIVE METABOLIC PANEL
ALT: 29 U/L (ref 0–44)
AST: 25 U/L (ref 15–41)
Albumin: 3.2 g/dL — ABNORMAL LOW (ref 3.5–5.0)
Alkaline Phosphatase: 133 U/L — ABNORMAL HIGH (ref 38–126)
Anion gap: 8 (ref 5–15)
BUN: 15 mg/dL (ref 6–20)
CO2: 25 mmol/L (ref 22–32)
Calcium: 9.4 mg/dL (ref 8.9–10.3)
Chloride: 106 mmol/L (ref 98–111)
Creatinine, Ser: 1.5 mg/dL — ABNORMAL HIGH (ref 0.61–1.24)
GFR calc Af Amer: >60 mL/min (ref >60)
GFR calc non Af Amer: 52 mL/min — ABNORMAL LOW (ref >60)
Glucose, Bld: 97 mg/dL (ref 70–99)
Potassium: 4.5 mmol/L (ref 3.5–5.1)
Sodium: 139 mmol/L (ref 135–145)
Total Bilirubin: 1.1 mg/dL (ref 0.3–1.2)
Total Protein: 7.4 g/dL (ref 6.5–8.1)

## 2018-10-15 LAB — MAGNESIUM: Magnesium: 2.1 mg/dL (ref 1.7–2.4)

## 2018-10-15 LAB — PROTIME-INR
INR: 2 — ABNORMAL HIGH (ref 0.8–1.2)
Prothrombin Time: 22.6 seconds — ABNORMAL HIGH (ref 11.4–15.2)

## 2018-10-15 LAB — LACTATE DEHYDROGENASE: LDH: 192 U/L (ref 98–192)

## 2018-10-15 LAB — PREALBUMIN: Prealbumin: 23 mg/dL (ref 18–38)

## 2018-10-15 LAB — SARS CORONAVIRUS 2 (TAT 6-24 HRS): SARS Coronavirus 2: NEGATIVE

## 2018-10-15 NOTE — Progress Notes (Signed)
Patient presents for 2 month follow up in [facility] today with his wife. Reports no problems with VAD equipment or concerns with drive line.    Pt states that he has been doing well since his last visit. He has had 1 low flow on 10/11/18 around 0415 in the morning. The pt states he was sleeping and that he did not hear this alarm.  Vital Signs:  Doppler Pressure: 100  Automatc BP: 122/58 (84)  HR: 71 SPO2: 96% RA  Weight: 173.4 lb w/oeqt Last weight: 177.2 lb Home weights: 168 - 169 lbs   VAD Indication: Destination therapy-   CKD  VAD interrogation & Equipment Management: Speed: 5400 Flow: 3.8  Power: 4.0 w    PI: 5.5  Alarms: none Events:  10-20 PI events daily  Fixed speed 5400 Low speed limit: 5100  Primary Controller:  Replace back up battery in 14 months. Back up controller:   Replace back up battery in 14 months.  Annual Equipment Maintenance on UBC/PM was performed on 05/2018.   I reviewed the LVAD parameters from today and compared the results to the patient's prior recorded data. LVAD interrogation was NEGATIVE for significant power changes, POSITIVE for clinical alarms and STABLE for PI events/speed drops. No programming changes were made and pump is functioning within specified parameters. Pt is performing daily controller and system monitor self tests along with completing weekly and monthly maintenance for LVAD equipment.  LVAD equipment check completed and is in good working order. Back-up equipment present.    Exit Site Care: Drive line is being maintained weekly by [name], his wife. Existing VAD dressing removed and site care performed using sterile technique. Drive line exit site cleaned with sterile saline ONLY, allowed to dry, and Sorbaview dressing with NO bio patch. Exit site healed and incorporated, the velour is fully implanted at exit site. No redness, drainage or foul odor noted. Drive line anchor re-applied. Pt denies fever or chills. Pt  given 24 weekly dressings and 12 anchors at this visit.  Significant Events on VAD Support:  none  Device:Medtronic Bi-V Therapies: on Last check: 05/30/2017   BP & Labs:  Doppler BP 100 - Doppler is reflecting Modified systolic.  Hgb 15 - No S/S of bleeding. Specifically denies melena/BRBPR or nosebleeds.  LDH stable at 192with established baseline of 165- 250. Denies tea-colored urine. No power elevations noted on interrogation.   1.5 year Intermacs follow up completed including:  Quality of Life, KCCQ-12, and Neurocognitive trail making. (1 min 47 sec)  Pt completed 1440  feet during 6 minute walk.  Back up controller:  11V backup battery charged during this visit.  EKG performed in clinic today.  Patient Instructions:  1. Please report to Admitting at the main entrance of the hospital no later than 8:45 on Wednesday morning. Do NOT eat or drink after midnight on the Tuesday evening. 2. You will need a driver but your family will not be allowed to come into the hospital and must wait in the care. 3. Please go to [employer] drive thru for COVID screening today at 1240 4. We will follow up with you after your cardioversion.  Tanda Rockers RN [place] Coordinator  Office: [phone]  24/7 Pager: [phone]

## 2018-10-15 NOTE — H&P (View-Only) (Signed)
LVAD Clinic Follow Up Note  HPI:  David [name] is a 53 y.o. male with history of ICD (09 PCI to LAD, mRCA, '10 PCI to Lcx and '12 mRCA), HFrEF (EF ~20% for >5 years), HLD and HTN. S/P LVAD HM-III 04/20/18.   Admitted 04/19/17 through 05/27/2017 for scheduled LVAD implant. On 12/27 he underwent HMIII LVAD implant. Hospital course complicated by PNA with achromobacter tracheal aspirate, shock liver, and AKI. Required short term dialysis.  Readmitted 05/30/17 with symptomatic anemia hgb 3.8.  INR 2.9. He was transfused with 4 units of packed red blood cell. 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  It was suspected AVMs were the likely cause of bleeding in the setting of anticoagulation.  He will need colonoscopy at some point, this can be done as outpatient. Discharged 06/04/17.  Follow up for Heart Failure/LVAD:  Presents for routine f/u with his wife. Continues to feel great. Able to do all activities without difficulty. Denies orthopnea or PND. No fevers, chills or problems with driveline. No bleeding, melena or neuro symptoms. No VAD alarms. Taking all meds as prescribed. Taking lasix 40 daily but drinking plenty of fluids. Has decided against transplant eval.     VAD Indication: Destination therapy-   CKD  VAD interrogation & Equipment Management: Speed: 5400 Flow: 3.8  Power: 4.0 w PI: 5.5  Alarms: none Events:  10-20 PI events daily  Fixed speed 5400 Low speed limit: 5100  Primary Controller: Replace back up battery in 14 months. Back up controller: Replace back up battery in 18months.  Annual Equipment Maintenance on UBC/PM was performed on 05/2018.    Past Medical History:  Diagnosis Date  . AICD (automatic cardioverter/defibrillator) present   . Anemia   . Anxiety   . CAD (coronary artery  disease) 2009   AMI in 12/2007 with PCI to LAD, staged PCI to  mid/distal RCA, NSTEMI in 02/2009 with BMS to LCx  . CHF (congestive heart failure) ([place])   . Chronic kidney disease 11/03/2016   stage 3 kidney disease  . Dysrhythmia    "arrythmia problems at some point", "fatal rhythms"  . History of blood transfusion    "I was bleeding from was rectum" (04/19/2017)  . HLD (hyperlipidemia)   . HTN (hypertension)   . Ischemic cardiomyopathy    Admitted in 07/2010 with CHF exacerbation  . Myocardial infarction ([place])    "I've had 4" (04/19/2017)  . Pneumonia 2018 X 1  . Seizures ([place])    one seizure in 04/2016 during cardiac event (04/19/2017)    Current Outpatient Medications  Medication Sig Dispense Refill  . atorvastatin (LIPITOR) 40 MG tablet TAKE 1 TABLET BY MOUTH ONCE DAILY AT  6  PM. 90 tablet 2  . furosemide (LASIX) 40 MG tablet Take 1 tablet (40 mg total) by mouth daily. KEEP OV. 90 tablet 3  . gabapentin (NEURONTIN) 300 MG capsule Take 1 capsule (300 mg total) by mouth 2 (two) times daily. 60 capsule 6  . losartan (COZAAR) 50 MG tablet Take 1 tablet (50 mg total) by mouth daily. 90 tablet 3  . Magnesium Oxide 400 MG CAPS Take 1 capsule (400 mg total) by mouth 3 (three) times daily. (Patient taking differently: Take 400 mg by mouth 2 (two) times daily. ) 90 capsule 6  . Multiple Vitamins-Minerals (ADULT GUMMY) CHEW Chew 2 tablets by mouth daily.    Marland Kitchen  pantoprazole (PROTONIX) 40 MG tablet Take 1 tablet (40 mg total) by mouth daily. 210 tablet 0  . potassium chloride SA (KLOR-CON M20) 20 MEQ tablet Take 1 tablet (20 mEq total) by mouth daily. KEEP OV. 90 tablet 3  . traZODone (DESYREL) 50 MG tablet Take 1 tablet (50 mg total) by mouth at bedtime. 30 tablet 5  . warfarin (COUMADIN) 2 MG tablet Take 2 tablets (4 mg total) by mouth daily. Or as directed (Patient taking differently: Take 2 mg by mouth daily. 4mg  on mondays Wed and Fri  and 2mg  AOD Or as directed by HF clinic) 75 tablet 4    No current facility-administered medications for this encounter.     Plavix [clopidogrel bisulfate]  Review of systems complete and found to be negative unless listed in HPI.      Vital Signs:  Doppler Pressure: 100  Automatc BP: 122/58 (84)  HR: 71 SPO2: 96% RA  Weight: 173.4 lb w/oeqt Last weight: 177.2 lb Home weights: 168 - 169 lbs    Physical Exam: General:  NAD.  HEENT: normal  Neck: supple. JVP 5-6 with occasional v-waves.  Carotids 2+ bilat; no bruits. No lymphadenopathy or thryomegaly appreciated. Cor: LVAD hum.  Lungs: Clear. Abdomen: soft, nontender, non-distended. No hepatosplenomegaly. No bruits or masses. Good bowel sounds. Driveline site clean. Anchor in place.  Extremities: no cyanosis, clubbing, rash. Warm no edema  Neuro: alert & oriented x 3. No focal deficits. Moves all 4 without problem   ECG AF 130s Personally reviewed   ASSESSMENT AND PLAN:   1. Chronic systolic CHF with HM III LVAD implant 04/20/17:  - Continues to do extremely well with VAD. - Stable NYHA I  - Volume status looks good. Continue lasix 40mg  daily. Suggested that he hold lasix when it is hot and he sweats a lot.  - He has been seen at [facility] for transplant referral but has decided not to pursue VAD for now  2. Symptomatic anemia to UGI AVM bleed - s/p APC x 4 lesions in 2/19 - Hgb stable today at 15.0 - No evidence of active bleeding - Continue PPI - Off ASA due to GIB. Goal INR still 2.0-2.5 - INR 2.0 today. Discussed dosing with PharmD personally. 3. H/O of AKI on CKD Stage III:  - Creatinine stable at 1.5 today (baseline 1.3 - 1.5) 4. VAD management - VAD interrogated personally. Parameters stable. Had one low flow. Told him to watch volume status - LDH 192 - INR goal 2.0-2.5 Off ASA due to GIB 2/19.  - INR 2.0 this am. Discussed dosing with PharmD personally. - MAPs look good in 80s. Continue losartan.  5. PAF:  - He is back in AF with RVR. Device interrogated  personally and AF dates back to early May.  - INR has been subtherapeutic. - Will plan TEE & DC-CV this week.  - If recurs will needAA thereapy  - Continue warfarin 6. CAD:  s/p PCI RCA/PLOM with DES x2 and DES to mild LAD 1/18 - No s/s of ischemia - Continue statin. Off ASA due to GIB 7. Hypomagnesemia. - Continue magnesium oxide 400 TID.   Total time spent 45 minutes. Over half that time spent discussing above.   Glori Bickers, MD  11:21 AM  .

## 2018-10-15 NOTE — Progress Notes (Signed)
LVAD Clinic Follow Up Note  HPI:  David [name] is a 53 y.o. male with history of ICD (09 PCI to LAD, mRCA, '10 PCI to Lcx and '12 mRCA), HFrEF (EF ~20% for >5 years), HLD and HTN. S/P LVAD HM-III 04/20/18.   Admitted 04/19/17 through 05/27/2017 for scheduled LVAD implant. On 12/27 he underwent HMIII LVAD implant. Hospital course complicated by PNA with achromobacter tracheal aspirate, shock liver, and AKI. Required short term dialysis.  Readmitted 05/30/17 with symptomatic anemia hgb 3.8.  INR 2.9. He was transfused with 4 units of packed red blood cell. 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  It was suspected AVMs were the likely cause of bleeding in the setting of anticoagulation.  He will need colonoscopy at some point, this can be done as outpatient. Discharged 06/04/17.  Follow up for Heart Failure/LVAD:  Presents for routine f/u with his wife. Continues to feel great. Able to do all activities without difficulty. Denies orthopnea or PND. No fevers, chills or problems with driveline. No bleeding, melena or neuro symptoms. No VAD alarms. Taking all meds as prescribed. Taking lasix 40 daily but drinking plenty of fluids. Has decided against transplant eval.     VAD Indication: Destination therapy-   CKD  VAD interrogation & Equipment Management: Speed: 5400 Flow: 3.8  Power: 4.0 w PI: 5.5  Alarms: none Events:  10-20 PI events daily  Fixed speed 5400 Low speed limit: 5100  Primary Controller: Replace back up battery in 14 months. Back up controller: Replace back up battery in 56months.  Annual Equipment Maintenance on UBC/PM was performed on 05/2018.    Past Medical History:  Diagnosis Date  . AICD (automatic cardioverter/defibrillator) present   . Anemia   . Anxiety   . CAD (coronary artery  disease) 2009   AMI in 12/2007 with PCI to LAD, staged PCI to  mid/distal RCA, NSTEMI in 02/2009 with BMS to LCx  . CHF (congestive heart failure) ([place])   . Chronic kidney disease 11/03/2016   stage 3 kidney disease  . Dysrhythmia    "arrythmia problems at some point", "fatal rhythms"  . History of blood transfusion    "I was bleeding from was rectum" (04/19/2017)  . HLD (hyperlipidemia)   . HTN (hypertension)   . Ischemic cardiomyopathy    Admitted in 07/2010 with CHF exacerbation  . Myocardial infarction ([place])    "I've had 4" (04/19/2017)  . Pneumonia 2018 X 1  . Seizures ([place])    one seizure in 04/2016 during cardiac event (04/19/2017)    Current Outpatient Medications  Medication Sig Dispense Refill  . atorvastatin (LIPITOR) 40 MG tablet TAKE 1 TABLET BY MOUTH ONCE DAILY AT  6  PM. 90 tablet 2  . furosemide (LASIX) 40 MG tablet Take 1 tablet (40 mg total) by mouth daily. KEEP OV. 90 tablet 3  . gabapentin (NEURONTIN) 300 MG capsule Take 1 capsule (300 mg total) by mouth 2 (two) times daily. 60 capsule 6  . losartan (COZAAR) 50 MG tablet Take 1 tablet (50 mg total) by mouth daily. 90 tablet 3  . Magnesium Oxide 400 MG CAPS Take 1 capsule (400 mg total) by mouth 3 (three) times daily. (Patient taking differently: Take 400 mg by mouth 2 (two) times daily. ) 90 capsule 6  . Multiple Vitamins-Minerals (ADULT GUMMY) CHEW Chew 2 tablets by mouth daily.    Marland Kitchen  pantoprazole (PROTONIX) 40 MG tablet Take 1 tablet (40 mg total) by mouth daily. 210 tablet 0  . potassium chloride SA (KLOR-CON M20) 20 MEQ tablet Take 1 tablet (20 mEq total) by mouth daily. KEEP OV. 90 tablet 3  . traZODone (DESYREL) 50 MG tablet Take 1 tablet (50 mg total) by mouth at bedtime. 30 tablet 5  . warfarin (COUMADIN) 2 MG tablet Take 2 tablets (4 mg total) by mouth daily. Or as directed (Patient taking differently: Take 2 mg by mouth daily. 4mg  on mondays Wed and Fri  and 2mg  AOD Or as directed by HF clinic) 75 tablet 4    No current facility-administered medications for this encounter.     Plavix [clopidogrel bisulfate]  Review of systems complete and found to be negative unless listed in HPI.      Vital Signs:  Doppler Pressure: 100  Automatc BP: 122/58 (84)  HR: 71 SPO2: 96% RA  Weight: 173.4 lb w/oeqt Last weight: 177.2 lb Home weights: 168 - 169 lbs    Physical Exam: General:  NAD.  HEENT: normal  Neck: supple. JVP 5-6 with occasional v-waves.  Carotids 2+ bilat; no bruits. No lymphadenopathy or thryomegaly appreciated. Cor: LVAD hum.  Lungs: Clear. Abdomen: soft, nontender, non-distended. No hepatosplenomegaly. No bruits or masses. Good bowel sounds. Driveline site clean. Anchor in place.  Extremities: no cyanosis, clubbing, rash. Warm no edema  Neuro: alert & oriented x 3. No focal deficits. Moves all 4 without problem   ECG AF 130s Personally reviewed   ASSESSMENT AND PLAN:   1. Chronic systolic CHF with HM III LVAD implant 04/20/17:  - Continues to do extremely well with VAD. - Stable NYHA I  - Volume status looks good. Continue lasix 40mg  daily. Suggested that he hold lasix when it is hot and he sweats a lot.  - He has been seen at [facility] for transplant referral but has decided not to pursue VAD for now  2. Symptomatic anemia to UGI AVM bleed - s/p APC x 4 lesions in 2/19 - Hgb stable today at 15.0 - No evidence of active bleeding - Continue PPI - Off ASA due to GIB. Goal INR still 2.0-2.5 - INR 2.0 today. Discussed dosing with PharmD personally. 3. H/O of AKI on CKD Stage III:  - Creatinine stable at 1.5 today (baseline 1.3 - 1.5) 4. VAD management - VAD interrogated personally. Parameters stable. Had one low flow. Told him to watch volume status - LDH 192 - INR goal 2.0-2.5 Off ASA due to GIB 2/19.  - INR 2.0 this am. Discussed dosing with PharmD personally. - MAPs look good in 80s. Continue losartan.  5. PAF:  - He is back in AF with RVR. Device interrogated  personally and AF dates back to early May.  - INR has been subtherapeutic. - Will plan TEE & DC-CV this week.  - If recurs will needAA thereapy  - Continue warfarin 6. CAD:  s/p PCI RCA/PLOM with DES x2 and DES to mild LAD 1/18 - No s/s of ischemia - Continue statin. Off ASA due to GIB 7. Hypomagnesemia. - Continue magnesium oxide 400 TID.   Total time spent 45 minutes. Over half that time spent discussing above.   Glori Bickers, MD  11:21 AM  .

## 2018-10-15 NOTE — Patient Instructions (Addendum)
1. Please report to Admitting at the main entrance of the hospital no later than 8:45 on Wednesday morning. Do NOT eat or drink after midnight on the Tuesday evening. 2. You will need a driver but your family will not be allowed to come into the hospital and must wait in the care. 3. We will follow up with you after your cardioversion.

## 2018-10-16 NOTE — Progress Notes (Addendum)
Spoke with pt's wife who stated that the patient has not been around anybody with COVID 19 and has maintained quarantine since COVID testing. Pt's wife also denies that pt has any signs and symptoms of COVID. Jobe Igo, RN

## 2018-10-17 ENCOUNTER — Ambulatory Visit (HOSPITAL_BASED_OUTPATIENT_CLINIC_OR_DEPARTMENT_OTHER)
Admission: RE | Admit: 2018-10-17 | Discharge: 2018-10-17 | Disposition: A | Discharge status: Home / Self Care | Payer: Medicare HMO | Source: Ambulatory Visit | Attending: Internal Medicine | Admitting: Internal Medicine

## 2018-10-17 ENCOUNTER — Encounter (HOSPITAL_COMMUNITY): Payer: Self-pay

## 2018-10-17 ENCOUNTER — Ambulatory Visit (HOSPITAL_COMMUNITY): Payer: Medicare HMO | Admitting: Certified Registered"

## 2018-10-17 ENCOUNTER — Ambulatory Visit (HOSPITAL_COMMUNITY)
Admission: RE | Admit: 2018-10-17 | Discharge: 2018-10-17 | Disposition: A | Discharge status: Home / Self Care | Payer: Medicare HMO | Attending: Internal Medicine | Admitting: Internal Medicine

## 2018-10-17 ENCOUNTER — Encounter (HOSPITAL_COMMUNITY)
Admission: RE | Disposition: A | Discharge status: Home / Self Care | Payer: Self-pay | Source: Home / Self Care | Attending: Internal Medicine

## 2018-10-17 ENCOUNTER — Other Ambulatory Visit: Payer: Self-pay

## 2018-10-17 DIAGNOSIS — I480 Paroxysmal atrial fibrillation: Secondary | ICD-10-CM | POA: Insufficient documentation

## 2018-10-17 DIAGNOSIS — N179 Acute kidney failure, unspecified: Secondary | ICD-10-CM | POA: Insufficient documentation

## 2018-10-17 DIAGNOSIS — E785 Hyperlipidemia, unspecified: Secondary | ICD-10-CM | POA: Diagnosis not present

## 2018-10-17 DIAGNOSIS — I2510 Atherosclerotic heart disease of native coronary artery without angina pectoris: Secondary | ICD-10-CM | POA: Insufficient documentation

## 2018-10-17 DIAGNOSIS — Z95811 Presence of heart assist device: Secondary | ICD-10-CM

## 2018-10-17 DIAGNOSIS — I5022 Chronic systolic (congestive) heart failure: Secondary | ICD-10-CM | POA: Diagnosis not present

## 2018-10-17 DIAGNOSIS — N183 Chronic kidney disease, stage 3 (moderate): Secondary | ICD-10-CM | POA: Diagnosis not present

## 2018-10-17 DIAGNOSIS — Z7901 Long term (current) use of anticoagulants: Secondary | ICD-10-CM | POA: Diagnosis not present

## 2018-10-17 DIAGNOSIS — I130 Hypertensive heart and chronic kidney disease with heart failure and stage 1 through stage 4 chronic kidney disease, or unspecified chronic kidney disease: Secondary | ICD-10-CM | POA: Insufficient documentation

## 2018-10-17 DIAGNOSIS — I255 Ischemic cardiomyopathy: Secondary | ICD-10-CM | POA: Insufficient documentation

## 2018-10-17 DIAGNOSIS — I340 Nonrheumatic mitral (valve) insufficiency: Secondary | ICD-10-CM | POA: Diagnosis not present

## 2018-10-17 DIAGNOSIS — Z992 Dependence on renal dialysis: Secondary | ICD-10-CM | POA: Insufficient documentation

## 2018-10-17 DIAGNOSIS — I4891 Unspecified atrial fibrillation: Secondary | ICD-10-CM

## 2018-10-17 DIAGNOSIS — Z79899 Other long term (current) drug therapy: Secondary | ICD-10-CM | POA: Diagnosis not present

## 2018-10-17 DIAGNOSIS — Z888 Allergy status to other drugs, medicaments and biological substances status: Secondary | ICD-10-CM | POA: Diagnosis not present

## 2018-10-17 DIAGNOSIS — I252 Old myocardial infarction: Secondary | ICD-10-CM | POA: Diagnosis not present

## 2018-10-17 DIAGNOSIS — Z955 Presence of coronary angioplasty implant and graft: Secondary | ICD-10-CM | POA: Insufficient documentation

## 2018-10-17 HISTORY — PX: CARDIOVERSION: SHX1299

## 2018-10-17 HISTORY — PX: TEE WITHOUT CARDIOVERSION: SHX5443

## 2018-10-17 LAB — PROTIME-INR
INR: 2.4 — ABNORMAL HIGH (ref 0.8–1.2)
Prothrombin Time: 25.6 seconds — ABNORMAL HIGH (ref 11.4–15.2)

## 2018-10-17 SURGERY — ECHOCARDIOGRAM, TRANSESOPHAGEAL
Anesthesia: Monitor Anesthesia Care

## 2018-10-17 MED ORDER — PROPOFOL 500 MG/50ML IV EMUL
INTRAVENOUS | Status: DC | PRN
  Administered 2018-10-17: 70 ug/kg/min via INTRAVENOUS

## 2018-10-17 MED ORDER — PROPOFOL 10 MG/ML IV BOLUS
INTRAVENOUS | Status: DC | PRN
  Administered 2018-10-17: 30 mg via INTRAVENOUS
  Administered 2018-10-17: 20 mg via INTRAVENOUS

## 2018-10-17 MED ORDER — SODIUM CHLORIDE 0.9 % IV SOLN
INTRAVENOUS | Status: DC
  Administered 2018-10-17: 09:00:00 via INTRAVENOUS

## 2018-10-17 MED ORDER — ETOMIDATE 2 MG/ML IV SOLN
INTRAVENOUS | Status: DC | PRN
  Administered 2018-10-17: 6 mg via INTRAVENOUS

## 2018-10-17 MED ORDER — LIDOCAINE 2% (20 MG/ML) 5 ML SYRINGE
INTRAMUSCULAR | Status: DC | PRN
  Administered 2018-10-17: 60 mg via INTRAVENOUS

## 2018-10-17 NOTE — CV Procedure (Signed)
   TRANSESOPHAGEAL ECHOCARDIOGRAM GUIDED DIRECT CURRENT CARDIOVERSION  NAME:  David [name]   MRN: [id number] DOB:  March 04, 1966   ADMIT DATE: 10/17/2018  INDICATIONS:  A fib  PROCEDURE:   Informed consent was obtained prior to the procedure. The risks, benefits and alternatives for the procedure were discussed and the patient comprehended these risks.  Risks include, but are not limited to, cough, sore throat, vomiting, nausea, somnolence, esophageal and stomach trauma or perforation, bleeding, low blood pressure, aspiration, pneumonia, infection, trauma to the teeth and death.    After a procedural time-out, the oropharynx was anesthetized and the patient was sedated by the anesthesia service. The transesophageal probe was inserted in the esophagus and stomach without difficulty and multiple views were obtained.   FINDINGS:  LEFT VENTRICLE: EF = 10% LVAD cannula well positioned  RIGHT VENTRICLE: Severe hypokinesis  LEFT ATRIUM: Mild to moderately sedated 4.5 cm  LEFT ATRIAL APPENDAGE: No clot  RIGHT ATRIUM: Normal + pacer clot  AORTIC VALVE:  Trileaflet.   MITRAL VALVE:    Normal mild MR  TRICUSPID VALVE: Normal mild TR  PULMONIC VALVE: Normal   INTERATRIAL SEPTUM: NO PFO/ASD  PERICARDIUM: No effusion   DESCENDING AORTA: not visualized   CARDIOVERSION:     Indications:  Atrial Fibrillation  Procedure Details:  Once the TEE was complete, the patient had the defibrillator pads placed in the anterior and posterior position. Once an appropriate level of sedation was achieved, the patient received a single biphasic, synchronized 200J shock with prompt conversion to sinus rhythm. No apparent complications.   Glori Bickers, MD  11:40 AM

## 2018-10-17 NOTE — Discharge Instructions (Signed)
Electrical Cardioversion, Care After °This sheet gives you information about how to care for yourself after your procedure. Your health care provider may also give you more specific instructions. If you have problems or questions, contact your health care provider. °What can I expect after the procedure? °After the procedure, it is common to have: °· Some redness on the skin where the shocks were given. °Follow these instructions at home: ° °· Do not drive for 24 hours if you were given a medicine to help you relax (sedative). °· Take over-the-counter and prescription medicines only as told by your health care provider. °· Ask your health care provider how to check your pulse. Check it often. °· Rest for 48 hours after the procedure or as told by your health care provider. °· Avoid or limit your caffeine use as told by your health care provider. °Contact a health care provider if: °· You feel like your heart is beating too quickly or your pulse is not regular. °· You have a serious muscle cramp that does not go away. °Get help right away if: ° °· You have discomfort in your chest. °· You are dizzy or you feel faint. °· You have trouble breathing or you are short of breath. °· Your speech is slurred. °· You have trouble moving an arm or leg on one side of your body. °· Your fingers or toes turn cold or blue. °This information is not intended to replace advice given to you by your health care provider. Make sure you discuss any questions you have with your health care provider. °Document Released: 01/30/2013 Document Revised: 11/13/2015 Document Reviewed: 10/16/2015 °Elsevier Interactive Patient Education © 2019 Else[employer] Inc[employer] ° °

## 2018-10-17 NOTE — Transfer of Care (Signed)
Immediate Anesthesia Transfer of Care Note  Patient: David [name]  Procedure(s) Performed: TRANSESOPHAGEAL ECHOCARDIOGRAM (TEE) (N/A ) CARDIOVERSION (N/A )  Patient Location: PACU  Anesthesia Type:MAC and General  Level of Consciousness: drowsy  Airway & Oxygen Therapy: Patient Spontanous Breathing and Patient connected to nasal cannula oxygen  Post-op Assessment: Report given to RN and Post -op Vital signs reviewed and stable  Post vital signs: Reviewed and stable  Last Vitals:  Vitals Value Taken Time  BP 123/96 10/17/18 1138  Temp 36.6 C 10/17/18 1137  Pulse 61 10/17/18 1142  Resp 30 10/17/18 1142  SpO2 97 % 10/17/18 1142  Vitals shown include unvalidated device data.  Last Pain:  Vitals:   10/17/18 1137  TempSrc: Oral  PainSc:          Complications: No apparent anesthesia complications

## 2018-10-17 NOTE — Anesthesia Preprocedure Evaluation (Signed)
Anesthesia Evaluation  Patient identified by MRN, date of birth, ID band Patient awake    Reviewed: Allergy & Precautions, NPO status , Patient's Chart, lab work & pertinent test results  Airway Mallampati: II  TM Distance: >3 FB Neck ROM: Full    Dental  (+) Edentulous Upper, Edentulous Lower   Pulmonary former smoker,    breath sounds clear to auscultation       Cardiovascular hypertension, + CAD, + Past MI and +CHF  + dysrhythmias + Cardiac Defibrillator  Rhythm:Regular Rate:Normal  Mechanical hum from LVAD clearly audible   Neuro/Psych Seizures -,  Anxiety    GI/Hepatic   Endo/Other    Renal/GU Renal disease     Musculoskeletal   Abdominal   Peds  Hematology  (+) anemia ,   Anesthesia Other Findings   Reproductive/Obstetrics                             Anesthesia Physical  Anesthesia Plan  ASA: IV  Anesthesia Plan: MAC   Post-op Pain Management:    Induction:   PONV Risk Score and Plan: Ondansetron, Propofol infusion, TIVA and Treatment may vary due to age or medical condition  Airway Management Planned: Simple Face Mask and Nasal Cannula  Additional Equipment:   Intra-op Plan:   Post-operative Plan:   Informed Consent: I have reviewed the patients History and Physical, chart, labs and discussed the procedure including the risks, benefits and alternatives for the proposed anesthesia with the patient or authorized representative who has indicated his/her understanding and acceptance.     Dental advisory given  Plan Discussed with: CRNA and Anesthesiologist  Anesthesia Plan Comments:         Anesthesia Quick Evaluation

## 2018-10-17 NOTE — Interval H&P Note (Signed)
History and Physical Interval Note:  10/17/2018 11:39 AM  David [name]  has presented today for surgery, with the diagnosis of afib rapid rvr, lvadt.  The various methods of treatment have been discussed with the patient and family. After consideration of risks, benefits and other options for treatment, the patient has consented to  Procedure(s): TRANSESOPHAGEAL ECHOCARDIOGRAM (TEE) (N/A) CARDIOVERSION (N/A) as a surgical intervention.  The patient's history has been reviewed, patient examined, no change in status, stable for surgery.  I have reviewed the patient's chart and labs.  Questions were answered to the patient's satisfaction.     Monic Engelmann

## 2018-10-17 NOTE — Interval H&P Note (Signed)
History and Physical Interval Note:  10/17/2018 8:49 AM  David [name]  has presented today for surgery, with the diagnosis of afib rapid rvr, lvadt.  The various methods of treatment have been discussed with the patient and family. After consideration of risks, benefits and other options for treatment, the patient has consented to  Procedure(s): TRANSESOPHAGEAL ECHOCARDIOGRAM (TEE) (N/A) CARDIOVERSION (N/A) as a surgical intervention.  The patient's history has been reviewed, patient examined, no change in status, stable for surgery.  I have reviewed the patient's chart and labs.  Questions were answered to the patient's satisfaction.     Hanan Moen

## 2018-10-17 NOTE — Progress Notes (Signed)
  Echocardiogram Echocardiogram Transesophageal has been performed.  Burnett Kanaris 10/17/2018, 11:51 AM

## 2018-10-17 NOTE — Anesthesia Postprocedure Evaluation (Signed)
Anesthesia Post Note  Patient: David [name]  Procedure(s) Performed: TRANSESOPHAGEAL ECHOCARDIOGRAM (TEE) (N/A ) CARDIOVERSION (N/A )     Patient location during evaluation: PACU Anesthesia Type: MAC Level of consciousness: awake and alert Pain management: pain level controlled Vital Signs Assessment: post-procedure vital signs reviewed and stable Respiratory status: spontaneous breathing Cardiovascular status: stable Anesthetic complications: no    Last Vitals:  Vitals:   10/17/18 1150 10/17/18 1200  BP: 124/85 111/73  Pulse: 88 (!) 58  Resp: (!) 24 14  Temp:    SpO2: 95% 97%    Last Pain:  Vitals:   10/17/18 1200  TempSrc:   PainSc: 0-No pain                 Nolon Nations

## 2018-10-17 NOTE — Progress Notes (Signed)
VAD Coordinator Procedure Note:   VAD Coordinator met patient in endoscopy. Pt undergoing TEE and cardioversion per Dr. Haroldine Laws. Hemodynamics and VAD parameters monitored by myself and anesthesia throughout the procedure. Blood pressures were obtained with automatic cuff on left arm and correlated with Doppler.    Time: Doppler Auto  BP Flow PI Power Speed  Pre-procedure:  1000 104 121/99 (105) 3.5 6.0 4.0 5400   1015  121/97 (101)       1030  126/105 (112)      Secation Induction: 1120  132/82 (94) 3.8 7.1 4.1 5400   1130   (135) 3.9 5.9 4.0 5400  Recovery Area: 1135  123/96 (101) 4.2 4.5 4.1 5400   1145  124/85 (97) 3.9 4.9 4.0 5400   1200  111/73 (86) 3.8 4.8 4.0 5400             Patient tolerated the procedure well. VAD Coordinator accompanied and remained with patient in recovery area.    Patient Disposition: Echo completed. Patient cardioverted with 200J. NSR 90s post cardioversion. Plan to discharge home.   Emerson Monte RN Social Circle Coordinator  Office: [phone]  24/7 Pager: [phone]

## 2018-10-18 ENCOUNTER — Encounter (HOSPITAL_COMMUNITY): Payer: Self-pay | Admitting: Internal Medicine

## 2018-10-22 ENCOUNTER — Ambulatory Visit (HOSPITAL_COMMUNITY): Payer: Self-pay | Admitting: Pharmacist

## 2018-10-22 DIAGNOSIS — Z95811 Presence of heart assist device: Secondary | ICD-10-CM

## 2018-10-22 LAB — POCT INR: INR: 2.1 (ref 2.0–3.0)

## 2018-10-25 ENCOUNTER — Telehealth (HOSPITAL_COMMUNITY): Payer: Self-pay | Admitting: Licensed Clinical Social Worker

## 2018-10-25 NOTE — Telephone Encounter (Signed)
CSW contacted caregiver to follow up on status with coronavirus and if anything needed. Patient instructed to stay home and use of proper hygiene and call if needed. CSW shared information about LVAD Support Group meeting virtually Monday July 6th if interested. Patient and caregiver denies any concerns at this time and verbalizes understanding of process for contacting VAD Coordinators if needed. CSW continues to follow as needed. Raquel Sarna, [place], [place]

## 2018-10-29 ENCOUNTER — Ambulatory Visit (HOSPITAL_COMMUNITY): Payer: Self-pay | Admitting: Pharmacist

## 2018-10-29 LAB — POCT INR: INR: 2.6 (ref 2.0–3.0)

## 2018-10-31 ENCOUNTER — Ambulatory Visit (INDEPENDENT_AMBULATORY_CARE_PROVIDER_SITE_OTHER): Payer: Medicare HMO | Admitting: *Deleted

## 2018-10-31 DIAGNOSIS — I255 Ischemic cardiomyopathy: Secondary | ICD-10-CM

## 2018-11-01 ENCOUNTER — Telehealth: Payer: Self-pay | Admitting: *Deleted

## 2018-11-01 LAB — CUP PACEART REMOTE DEVICE CHECK
Battery Remaining Longevity: 80 mo
Battery Voltage: 2.98 V
Brady Statistic AP VP Percent: 0 %
Brady Statistic AP VS Percent: 0 %
Brady Statistic AS VP Percent: 0.01 %
Brady Statistic AS VS Percent: 99.99 %
Brady Statistic RA Percent Paced: 0 %
Brady Statistic RV Percent Paced: 0.02 %
Date Time Interrogation Session: 20200708182734
HighPow Impedance: 64 Ohm
Implantable Lead Implant Date: 20180409
Implantable Lead Implant Date: 20180409
Implantable Lead Implant Date: 20180716
Implantable Lead Implant Date: 20180716
Implantable Lead Location: 753858
Implantable Lead Location: 753858
Implantable Lead Location: 753859
Implantable Lead Location: 753860
Implantable Lead Model: 5076
Implantable Lead Model: 511212
Implantable Lead Model: 511212
Implantable Lead Serial Number: 263773
Implantable Lead Serial Number: 263775
Implantable Pulse Generator Implant Date: 20180409
Lead Channel Impedance Value: 266 Ohm
Lead Channel Impedance Value: 2888 Ohm
Lead Channel Impedance Value: 380 Ohm
Lead Channel Impedance Value: 4047 Ohm
Lead Channel Impedance Value: 437 Ohm
Lead Channel Impedance Value: 703 Ohm
Lead Channel Pacing Threshold Amplitude: 0.625 V
Lead Channel Pacing Threshold Amplitude: 0.75 V
Lead Channel Pacing Threshold Amplitude: 1.75 V
Lead Channel Pacing Threshold Pulse Width: 0.4 ms
Lead Channel Pacing Threshold Pulse Width: 0.4 ms
Lead Channel Pacing Threshold Pulse Width: 1 ms
Lead Channel Sensing Intrinsic Amplitude: 13.125 mV
Lead Channel Sensing Intrinsic Amplitude: 13.125 mV
Lead Channel Sensing Intrinsic Amplitude: 2.125 mV
Lead Channel Sensing Intrinsic Amplitude: 2.125 mV
Lead Channel Setting Pacing Amplitude: 2.5 V
Lead Channel Setting Pacing Amplitude: 3.5 V
Lead Channel Setting Pacing Pulse Width: 0.4 ms
Lead Channel Setting Pacing Pulse Width: 1 ms
Lead Channel Setting Sensing Sensitivity: 0.3 mV

## 2018-11-01 NOTE — Telephone Encounter (Signed)
Richardson Landry - can you get him in to evaluate need for AFL ablation. I just DC-CV him. Thanks -dan

## 2018-11-01 NOTE — Telephone Encounter (Signed)
E and D  Do we have any data on the atrial tachyarrhythmia that was DCCV-- the ECG is hard because of the LVAD, but is irregular while the aflutter is regular  This prompts the Q about whether he has both fib and flutter and the value then of a flutter ablation is attenuated-- would be in my mind limited to the situation wherein flutter was Sx 2/2 rate and fib not so, but the fact that if it was fib, that arrhythmia prompted a need for DCCV  Lets chat directly

## 2018-11-01 NOTE — Telephone Encounter (Signed)
Per transmission from 10/31/18, patient presented in A-flutter/Vs 146bpm. 1 VT episode on 10/23/18, successfully terminated with ATP x2. 95 "SVT-AF" episodes (therapy withheld), morphology appears consistent with A-flutter w/RVR. V rates are not well controlled. Also noted LV (epicardial) pacing impedance >3000 ohms, appears chronic since ~04/2017 per trend, but I do not see where this has been previously addressed, though the pt alert tone is programmed off. LVAD placed 04/20/2017. Pt is overdue for f/u with Dr. Caryl Comes as of 10/2016.  Spoke with patient's wife, David [name] ([place]). She reports patient has been doing well since his recent DCCV, reports she asks him how he feels and he always says he is doing well, he didn't mention symptoms with VT event on 6/30 or with return to AF/A-flutter on 10/25/18. Confirmed compliance with all medications, including warfarin. Advised I will route this message to Dr. Caryl Comes and Dr. Haroldine Laws for review and recommendations. David [name] is agreeable to this plan and denies further questions at this time.  Called David [name] back to advise of [place] DMV driving restrictions x6 months, left detailed message (per DPR) with driving restrictions and DC phone number for questions/concerns.   Presenting:

## 2018-11-01 NOTE — Telephone Encounter (Signed)
Agreed but my concern was that the flutter prompted ICD firing. Can he be seen in EP clinic soon? Ill call you tomorrow

## 2018-11-02 NOTE — Telephone Encounter (Signed)
Linna Hoff you are like family, sort of, as you  Name is often afforded one I am afforded walking in the halls You can call anytime ( before 10 pm and after 7 am )

## 2018-11-05 ENCOUNTER — Ambulatory Visit (HOSPITAL_COMMUNITY): Payer: Self-pay | Admitting: Pharmacist

## 2018-11-05 DIAGNOSIS — Z95811 Presence of heart assist device: Secondary | ICD-10-CM

## 2018-11-05 LAB — POCT INR: INR: 2 (ref 2.0–3.0)

## 2018-11-06 NOTE — Telephone Encounter (Signed)
Pt set up to see Dr Caryl Comes on July 29.

## 2018-11-12 ENCOUNTER — Encounter: Payer: Self-pay | Admitting: Cardiology

## 2018-11-12 ENCOUNTER — Ambulatory Visit (HOSPITAL_COMMUNITY): Payer: Self-pay | Admitting: Pharmacist

## 2018-11-12 DIAGNOSIS — Z95811 Presence of heart assist device: Secondary | ICD-10-CM

## 2018-11-12 LAB — POCT INR: INR: 1.7 — AB (ref 2.0–3.0)

## 2018-11-12 NOTE — Progress Notes (Signed)
Remote ICD transmission.   

## 2018-11-19 ENCOUNTER — Ambulatory Visit (HOSPITAL_COMMUNITY): Payer: Self-pay | Admitting: Pharmacist

## 2018-11-19 DIAGNOSIS — Z95811 Presence of heart assist device: Secondary | ICD-10-CM

## 2018-11-19 LAB — POCT INR: INR: 2.3 (ref 2–3)

## 2018-11-21 ENCOUNTER — Encounter: Payer: Medicare HMO | Admitting: Internal Medicine

## 2018-11-26 ENCOUNTER — Ambulatory Visit (HOSPITAL_COMMUNITY): Payer: Self-pay | Admitting: Pharmacist

## 2018-11-26 DIAGNOSIS — Z95811 Presence of heart assist device: Secondary | ICD-10-CM

## 2018-11-26 LAB — POCT INR: INR: 1.7 — AB (ref 2.0–3.0)

## 2018-11-30 ENCOUNTER — Other Ambulatory Visit: Payer: Self-pay

## 2018-12-03 ENCOUNTER — Ambulatory Visit (HOSPITAL_COMMUNITY): Payer: Self-pay | Admitting: Pharmacist

## 2018-12-03 DIAGNOSIS — Z95811 Presence of heart assist device: Secondary | ICD-10-CM

## 2018-12-03 LAB — POCT INR: INR: 2 (ref 2.0–3.0)

## 2018-12-10 ENCOUNTER — Ambulatory Visit (HOSPITAL_COMMUNITY): Payer: Self-pay | Admitting: Pharmacist

## 2018-12-10 DIAGNOSIS — Z95811 Presence of heart assist device: Secondary | ICD-10-CM

## 2018-12-10 LAB — POCT INR: INR: 2 (ref 2.0–3.0)

## 2018-12-10 NOTE — Progress Notes (Signed)
VAD INR

## 2018-12-13 ENCOUNTER — Other Ambulatory Visit (HOSPITAL_COMMUNITY): Payer: Self-pay | Admitting: *Deleted

## 2018-12-13 DIAGNOSIS — Z7901 Long term (current) use of anticoagulants: Secondary | ICD-10-CM

## 2018-12-13 DIAGNOSIS — Z95811 Presence of heart assist device: Secondary | ICD-10-CM

## 2018-12-17 ENCOUNTER — Encounter (HOSPITAL_COMMUNITY): Payer: Medicare HMO

## 2018-12-17 LAB — POCT INR: INR: 2.4 (ref 2.0–3.0)

## 2018-12-18 ENCOUNTER — Ambulatory Visit (HOSPITAL_COMMUNITY): Payer: Self-pay | Admitting: Pharmacist

## 2018-12-18 DIAGNOSIS — Z95811 Presence of heart assist device: Secondary | ICD-10-CM

## 2018-12-18 NOTE — Progress Notes (Signed)
LVAD INR 

## 2018-12-24 LAB — POCT INR: INR: 2.4 (ref 2.0–3.0)

## 2018-12-25 ENCOUNTER — Ambulatory Visit (HOSPITAL_COMMUNITY): Payer: Self-pay | Admitting: Pharmacist

## 2018-12-25 DIAGNOSIS — Z95811 Presence of heart assist device: Secondary | ICD-10-CM

## 2018-12-25 NOTE — Progress Notes (Signed)
LVAD INR 

## 2019-01-01 ENCOUNTER — Ambulatory Visit (HOSPITAL_COMMUNITY): Payer: Self-pay | Admitting: Pharmacist

## 2019-01-01 ENCOUNTER — Other Ambulatory Visit (HOSPITAL_COMMUNITY): Payer: Self-pay | Admitting: *Deleted

## 2019-01-01 DIAGNOSIS — Z95811 Presence of heart assist device: Secondary | ICD-10-CM

## 2019-01-01 LAB — POCT INR: INR: 2.4 (ref 2.0–3.0)

## 2019-01-01 NOTE — Progress Notes (Signed)
LVAD INR 

## 2019-01-02 ENCOUNTER — Encounter (HOSPITAL_COMMUNITY): Payer: Self-pay

## 2019-01-02 ENCOUNTER — Other Ambulatory Visit: Payer: Self-pay

## 2019-01-02 ENCOUNTER — Ambulatory Visit (HOSPITAL_COMMUNITY)
Admission: RE | Admit: 2019-01-02 | Discharge: 2019-01-02 | Disposition: A | Discharge status: Home / Self Care | Payer: Medicare HMO | Source: Ambulatory Visit | Attending: Cardiology | Admitting: Cardiology

## 2019-01-02 VITALS — BP 105/61 | HR 120 | Wt 176.5 lb

## 2019-01-02 DIAGNOSIS — I5022 Chronic systolic (congestive) heart failure: Secondary | ICD-10-CM | POA: Diagnosis not present

## 2019-01-02 DIAGNOSIS — D649 Anemia, unspecified: Secondary | ICD-10-CM | POA: Insufficient documentation

## 2019-01-02 DIAGNOSIS — I10 Essential (primary) hypertension: Secondary | ICD-10-CM

## 2019-01-02 DIAGNOSIS — I130 Hypertensive heart and chronic kidney disease with heart failure and stage 1 through stage 4 chronic kidney disease, or unspecified chronic kidney disease: Secondary | ICD-10-CM | POA: Diagnosis not present

## 2019-01-02 DIAGNOSIS — I252 Old myocardial infarction: Secondary | ICD-10-CM | POA: Diagnosis not present

## 2019-01-02 DIAGNOSIS — Z95810 Presence of automatic (implantable) cardiac defibrillator: Secondary | ICD-10-CM | POA: Diagnosis not present

## 2019-01-02 DIAGNOSIS — I4892 Unspecified atrial flutter: Secondary | ICD-10-CM

## 2019-01-02 DIAGNOSIS — Z7901 Long term (current) use of anticoagulants: Secondary | ICD-10-CM | POA: Insufficient documentation

## 2019-01-02 DIAGNOSIS — Z955 Presence of coronary angioplasty implant and graft: Secondary | ICD-10-CM | POA: Insufficient documentation

## 2019-01-02 DIAGNOSIS — E785 Hyperlipidemia, unspecified: Secondary | ICD-10-CM | POA: Insufficient documentation

## 2019-01-02 DIAGNOSIS — N183 Chronic kidney disease, stage 3 (moderate): Secondary | ICD-10-CM | POA: Insufficient documentation

## 2019-01-02 DIAGNOSIS — Z79899 Other long term (current) drug therapy: Secondary | ICD-10-CM | POA: Insufficient documentation

## 2019-01-02 DIAGNOSIS — Z95811 Presence of heart assist device: Secondary | ICD-10-CM | POA: Insufficient documentation

## 2019-01-02 DIAGNOSIS — I255 Ischemic cardiomyopathy: Secondary | ICD-10-CM | POA: Diagnosis not present

## 2019-01-02 DIAGNOSIS — I2510 Atherosclerotic heart disease of native coronary artery without angina pectoris: Secondary | ICD-10-CM | POA: Insufficient documentation

## 2019-01-02 DIAGNOSIS — I5020 Unspecified systolic (congestive) heart failure: Secondary | ICD-10-CM

## 2019-01-02 DIAGNOSIS — I5043 Acute on chronic combined systolic (congestive) and diastolic (congestive) heart failure: Secondary | ICD-10-CM

## 2019-01-02 LAB — BASIC METABOLIC PANEL
Anion gap: 11 (ref 5–15)
BUN: 11 mg/dL (ref 6–20)
CO2: 22 mmol/L (ref 22–32)
Calcium: 9.1 mg/dL (ref 8.9–10.3)
Chloride: 104 mmol/L (ref 98–111)
Creatinine, Ser: 1.34 mg/dL — ABNORMAL HIGH (ref 0.61–1.24)
GFR calc Af Amer: >60 mL/min (ref >60)
GFR calc non Af Amer: 60 mL/min — ABNORMAL LOW (ref >60)
Glucose, Bld: 96 mg/dL (ref 70–99)
Potassium: 3.6 mmol/L (ref 3.5–5.1)
Sodium: 137 mmol/L (ref 135–145)

## 2019-01-02 LAB — VITAMIN B12: Vitamin B-12: 604 pg/mL (ref 180–914)

## 2019-01-02 LAB — CBC
HCT: 51.2 % (ref 39.0–52.0)
Hemoglobin: 15.6 g/dL (ref 13.0–17.0)
MCH: 24.4 pg — ABNORMAL LOW (ref 26.0–34.0)
MCHC: 30.5 g/dL (ref 30.0–36.0)
MCV: 80 fL (ref 80.0–100.0)
Platelets: 183 10*3/uL (ref 150–400)
RBC: 6.4 MIL/uL — ABNORMAL HIGH (ref 4.22–5.81)
RDW: 17.4 % — ABNORMAL HIGH (ref 11.5–15.5)
WBC: 7.9 10*3/uL (ref 4.0–10.5)
nRBC: 0 % (ref 0.0–0.2)

## 2019-01-02 LAB — PROTIME-INR
INR: 2.5 — ABNORMAL HIGH (ref 0.8–1.2)
Prothrombin Time: 26.4 seconds — ABNORMAL HIGH (ref 11.4–15.2)

## 2019-01-02 LAB — FERRITIN: Ferritin: 63 ng/mL (ref 24–336)

## 2019-01-02 LAB — LACTATE DEHYDROGENASE: LDH: 241 U/L — ABNORMAL HIGH (ref 98–192)

## 2019-01-02 LAB — IRON AND TIBC
Iron: 48 ug/dL (ref 45–182)
Saturation Ratios: 15 % — ABNORMAL LOW (ref 17.9–39.5)
TIBC: 330 ug/dL (ref 250–450)
UIBC: 282 ug/dL

## 2019-01-02 LAB — FOLATE: Folate: 11.5 ng/mL (ref >5.9)

## 2019-01-02 LAB — MAGNESIUM: Magnesium: 1.8 mg/dL (ref 1.7–2.4)

## 2019-01-02 MED ORDER — AMIODARONE HCL 200 MG PO TABS: 200.0000 mg | ORAL_TABLET | Freq: Every day | ORAL | 3 refills | Status: DC

## 2019-01-02 MED ORDER — PANTOPRAZOLE SODIUM 40 MG PO TBEC: 40.0000 mg | DELAYED_RELEASE_TABLET | Freq: Every day | ORAL | 0 refills | Status: DC

## 2019-01-02 MED ORDER — POTASSIUM CHLORIDE CRYS ER 20 MEQ PO TBCR: 20.0000 meq | EXTENDED_RELEASE_TABLET | Freq: Every day | ORAL | 3 refills | Status: DC

## 2019-01-02 MED ORDER — LOSARTAN POTASSIUM 50 MG PO TABS: 50.0000 mg | ORAL_TABLET | Freq: Every day | ORAL | 3 refills | Status: DC

## 2019-01-02 NOTE — Progress Notes (Signed)
Patient presents for 2 month follow up in [facility] today with his wife. Reports no problems with VAD equipment or concerns with drive line.    Pt states that he has been doing well since his last visit. He believes he had 1 low flow alarm a few weeks ago [place] her out of the water. Otherwise no alarms.   Patient's wife states he has been out of blood pressure medication for 3 days. States she called pharmacy and was told they did not have refills available. States she called VAD clinic and "spoke to someone" and was told "keep your appointment and you can get refill at that time." Unsure of who she spoke with, but it was not one of the VAD coordinators. Encouraged her to call VAD clinic directly in the future if refills needed.   Pt is in aflutter today. Rate 90s-130s. Pt asymptomatic. EKG performed in clinic today. Medtronic interrogation completed by rep and reviewed by Dr Haroldine Laws. Pt has been in aflutter since July 2nd. Pt has appointment with Dr Caryl Comes October 9th. Will reach out to Dr Caryl Comes to see if he can see him sooner to discuss ablation vs cardioversion. Will start Amiodarone 200mg  daily per Dr Haroldine Laws.   Vital Signs:  Doppler Pressure: 118 Automatc BP: 105/61 (83)  HR: 90s-130s aflutter SPO2: 98% RA  Weight: 176.5 lb w/oeqt Last weight: 173.4 lb Home weights: 168 - 169 lbs   VAD Indication: Destination therapy-   CKD  VAD interrogation & Equipment Management: Speed: 5400 Flow: 3.9  Power: 4.0 w    PI: 6.0  Alarms: none Events:  10-20 PI events daily  Fixed speed 5400 Low speed limit: 5100  Primary Controller:  Replace back up battery in 11 months. Back up controller:   Replace back up battery in 14 months.  Annual Equipment Maintenance on UBC/PM was performed on 05/2018.   I reviewed the LVAD parameters from today and compared the results to the patient's prior recorded data. LVAD interrogation  was NEGATIVE for significant power changes, POSITIVE for clinical alarms and STABLE for PI events/speed drops. No programming changes were made and pump is functioning within specified parameters. Pt is performing daily controller and system monitor self tests along with completing weekly and monthly maintenance for LVAD equipment.  LVAD equipment check completed and is in good working order. Back-up equipment not present today in clinic.    Exit Site Care: Drive line is being maintained weekly by [name], his wife. Exit site healed and incorporated, the velour is fully implanted at exit site. No redness, drainage or foul odor noted. Pt denies fever or chills. Pt given 8 weekly dressings and 5 anchors at this visit.   Significant Events on VAD Support:  none  Device:Medtronic Bi-V Therapies: on Last check: 05/30/2017   BP & Labs:  Doppler BP 118 - Doppler is reflecting Modified systolic.  Hgb 15.6 - No S/S of bleeding. Specifically denies melena/BRBPR or nosebleeds.  LDH stable at 241with established baseline of 165- 250. Denies tea-colored urine. No power elevations noted on interrogation.    Patient Instructions:  1. Start Amiodarone 200mg  daily for your atrial flutter 2. Coumadin dosing per Ander Purpura PharmD 3. Return to [place] clinic in 1 month 4. We will contact Dr Olin Pia office to have them schedule you for an appointment sooner  Emerson Monte RN [place] Coordinator  Office: [phone]  24/7 Pager: [phone]

## 2019-01-02 NOTE — Patient Instructions (Addendum)
1. Start Amiodarone 200mg  daily for your atrial flutter 2. Coumadin dosing per Ander Purpura PharmD 3. Return to [place] clinic in 1 month 4. We will contact Dr Olin Pia office to have them schedule you for an appointment sooner

## 2019-01-02 NOTE — Progress Notes (Signed)
LVAD Clinic Follow Up Note  HPI:  David [name] is a 53 y.o. male with history of ICD (09 PCI to LAD, mRCA, '10 PCI to Lcx and '12 mRCA), HFrEF (EF ~20% for >5 years), HLD and HTN. S/P LVAD HM-III 04/20/18.   Admitted 04/19/17 through 05/27/2017 for scheduled LVAD implant. On 12/27 he underwent HMIII LVAD implant. Hospital course complicated by PNA with achromobacter tracheal aspirate, shock liver, and AKI. Required short term dialysis.  Readmitted 05/30/17 with symptomatic anemia hgb 3.8.  INR 2.9. He was transfused with 4 units of packed red blood cell. 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  It was suspected AVMs were the likely cause of bleeding in the setting of anticoagulation.  He will need colonoscopy at some point, this can be done as outpatient. Discharged 06/04/17.  Underwent DC-CV for AFL on 10/17/18   Follow up for Heart Failure/LVAD:  Presents for routine f/u with his wife. Continues to feel great. Active without any real limitations. Denies CP or SOB. Patient's wife states he has been out of blood pressure medication for 3 days. Denies orthopnea or PND. No fevers, chills or problems with driveline. No bleeding, melena or neuro symptoms. No VAD alarms - except says he heard a beep when he tired to pull hos 300 pound daughter out of [place]. (? Low flow)   VAD Indication: Destination therapy-   CKD  VAD interrogation & Equipment Management: Speed: 5400 Flow: 3.9  Power: 4.0 w PI: 6.0  Alarms: none Events:  10-20 PI events daily  Fixed speed 5400 Low speed limit: 5100  Primary Controller: Replace back up battery in 11 months. Back up controller: Replace back up battery in 72months.  Annual Equipment Maintenance on UBC/PM was performed on 05/2018.    Past Medical  History:  Diagnosis Date  . AICD (automatic cardioverter/defibrillator) present   . Anemia   . Anxiety   . CAD (coronary artery disease) 2009   AMI in 12/2007 with PCI to LAD, staged PCI to  mid/distal RCA, NSTEMI in 02/2009 with BMS to LCx  . CHF (congestive heart failure) ([place])   . Chronic kidney disease 11/03/2016   stage 3 kidney disease  . Dysrhythmia    "arrythmia problems at some point", "fatal rhythms"  . History of blood transfusion    "I was bleeding from was rectum" (04/19/2017)  . HLD (hyperlipidemia)   . HTN (hypertension)   . Ischemic cardiomyopathy    Admitted in 07/2010 with CHF exacerbation  . Myocardial infarction ([place])    "I've had 4" (04/19/2017)  . Pneumonia 2018 X 1  . Seizures ([place])    one seizure in 04/2016 during cardiac event (04/19/2017)    Current Outpatient Medications  Medication Sig Dispense Refill  . atorvastatin (LIPITOR) 40 MG tablet TAKE 1 TABLET BY MOUTH ONCE DAILY AT  6  PM. 90 tablet 2  . furosemide (LASIX) 40 MG tablet Take 1 tablet (40 mg total) by mouth daily. KEEP OV. 90 tablet 3  . gabapentin (NEURONTIN) 300 MG capsule Take 1 capsule (300 mg total) by mouth 2 (two) times daily. 60 capsule 6  . losartan (COZAAR) 50 MG tablet Take 1 tablet (50 mg total) by mouth daily. 90 tablet 3  . Magnesium Oxide 400 MG CAPS Take 1 capsule (400 mg total) by mouth 3 (  three) times daily. (Patient taking differently: Take 400 mg by mouth 2 (two) times daily. ) 90 capsule 6  . Multiple Vitamins-Minerals (ADULT GUMMY) CHEW Chew 2 tablets by mouth daily.    . pantoprazole (PROTONIX) 40 MG tablet Take 1 tablet (40 mg total) by mouth daily. 210 tablet 0  . potassium chloride SA (KLOR-CON M20) 20 MEQ tablet Take 1 tablet (20 mEq total) by mouth daily. KEEP OV. 90 tablet 3  . traZODone (DESYREL) 50 MG tablet Take 1 tablet (50 mg total) by mouth at bedtime. 30 tablet 5  . vitamin C (ASCORBIC ACID) 500 MG tablet Take 500 mg by mouth daily as needed (pt prefrence).     . warfarin (COUMADIN) 2 MG tablet Take 2 tablets (4 mg total) by mouth daily. Or as directed (Patient taking differently: Take 2-4 mg by mouth See admin instructions. 4mg  on mondays Wed and Fri  and 2mg  AOD Or as directed by HF clinic) 75 tablet 4  . amiodarone (PACERONE) 200 MG tablet Take 1 tablet (200 mg total) by mouth daily. 90 tablet 3   No current facility-administered medications for this encounter.     Plavix [clopidogrel bisulfate]  Review of systems complete and found to be negative unless listed in HPI.      Physical Exam: Vital Signs:  Doppler Pressure: 118 Automatc BP: 105/61 (83)  HR: 90s-130s aflutter SPO2: 98% RA  Weight: 176.5 lb w/oeqt Last weight: 173.4 lb Home weights: 168 - 169 lbs  General:  NAD.  HEENT: normal  Neck: supple. JVP 8 with prominent  cv waves.  Carotids 2+ bilat; no bruits. No lymphadenopathy or thryomegaly appreciated. Cor: LVAD hum.  Lungs: Clear. Abdomen: oft, nontender, non-distended. No hepatosplenomegaly. No bruits or masses. Good bowel sounds. Driveline site clean. Anchor in place.  Extremities: no cyanosis, clubbing, rash. Warm no edema  Neuro: alert & oriented x 3. No focal deficits. Moves all 4 without problem   ECG AFL 132s Personally reviewed    ASSESSMENT AND PLAN:   1. Chronic systolic CHF with HM III LVAD implant 04/20/17:  - Continues to do extremely well with VAD. - Stable NYHA I - Volume status minimally elevated after being out of meds. Will restart . Continue lasix 40mg  daily.  - He has been seen at [facility] for transplant referral but has decided not to pursue VAD for now   2. Recurrent AFL with RVR - had DC-CV on 10/17/18. - ICD interrogated in clinic and found to be in AFL since 10/29/18 - d/w Dr. Caryl Comes. Will start amio 200 daily - Dr. Caryl Comes will see to arrange AFL ablation  3. Symptomatic anemia to UGI AVM bleed - s/p APC x 4 lesions in 2/19 - Hgb stable today at 15.6 - No evidence of active bleeding -  Continue PPI - Off ASA due to GIB.   4. H/O of AKI on CKD Stage III:  - Creatinine stable at 1.34 today (baseline 1.3 - 1.5)  5. VAD management - VAD interrogated personally. Parameters stable. Had one low flow. Told him to watch volume status - LDH 241 - INR goal 2.0-2.5 Off ASA due to GIB 2/19.  - INR 2.5 this am. Personally reviewed - MAPs look good in 80-low 90s.  Despite being out of meds. Restart losartan.   6. CAD:  s/p PCI RCA/PLOM with DES x2 and DES to mild LAD 1/18 - No s/s of ischemia - Continue statin. Off ASA due to GIB  7. Hypomagnesemia. -  Mag 1.8 today. Continue magnesium oxide 400 TID.   Total time spent 40 minutes. Over half that time spent discussing above.   Glori Bickers, MD  2:54 PM  .

## 2019-01-07 ENCOUNTER — Ambulatory Visit (HOSPITAL_COMMUNITY): Payer: Self-pay | Admitting: Pharmacist

## 2019-01-07 DIAGNOSIS — Z95811 Presence of heart assist device: Secondary | ICD-10-CM

## 2019-01-07 LAB — POCT INR: INR: 2.8 (ref 2.0–3.0)

## 2019-01-07 NOTE — Progress Notes (Signed)
LVAD INR 

## 2019-01-11 ENCOUNTER — Telehealth: Payer: Self-pay

## 2019-01-11 NOTE — Telephone Encounter (Signed)
Pts wife has agreed to [facility] Sept 23 @ 1:15 per Dr. Caryl Comes. Pts wife is requesting she be called with Dr Caryl Comes is in the room. I agreed and asked her to have him remind [place] of this request. She verbalized understanding.

## 2019-01-11 NOTE — Telephone Encounter (Signed)
-----   Message from Deboraha Sprang, MD sent at 01/11/2019 10:01 AM EDT ----- Regarding: RE: Appointment Ladies can we ask this man to come in at 115 on Wednesday next week Thanks sk  ----- Message ----- From: Mertha Baars, RN Sent: 01/02/2019   1:42 PM EDT To: Jolaine Artist, MD, Deboraha Sprang, MD, # Subject: Appointment                                    Dr Caryl Comes,  We saw David [name] (VAD patient of Dr Bensimhon's) in clinic today. Patient is in aflutter rate 90s-130s. Medtronic interrogation shows he has been in flutter since July 2nd. He has an appointment with you October 9th, but Dr Haroldine Laws was wondering if you could please see him sooner to discuss ablation vs cardioversion. We started him on Amio 200mg  daily today.   Thanks, Emerson Monte RN [place] Coordinator Office: [phone] Pager: [phone]

## 2019-01-14 ENCOUNTER — Ambulatory Visit (HOSPITAL_COMMUNITY): Payer: Self-pay | Admitting: Pharmacist

## 2019-01-14 LAB — POCT INR: INR: 1.9 — AB (ref 2.0–3.0)

## 2019-01-14 NOTE — Progress Notes (Signed)
LVAD INR 

## 2019-01-16 ENCOUNTER — Encounter: Payer: Medicare HMO | Admitting: Internal Medicine

## 2019-01-16 ENCOUNTER — Encounter: Payer: Self-pay | Admitting: Internal Medicine

## 2019-01-16 ENCOUNTER — Other Ambulatory Visit: Payer: Self-pay

## 2019-01-16 ENCOUNTER — Ambulatory Visit (INDEPENDENT_AMBULATORY_CARE_PROVIDER_SITE_OTHER): Payer: Medicare HMO | Admitting: Internal Medicine

## 2019-01-16 VITALS — HR 56 | Ht 60.0 in | Wt 175.2 lb

## 2019-01-16 DIAGNOSIS — I5022 Chronic systolic (congestive) heart failure: Secondary | ICD-10-CM | POA: Diagnosis not present

## 2019-01-16 DIAGNOSIS — I472 Ventricular tachycardia, unspecified: Secondary | ICD-10-CM

## 2019-01-16 DIAGNOSIS — Z95810 Presence of automatic (implantable) cardiac defibrillator: Secondary | ICD-10-CM | POA: Diagnosis not present

## 2019-01-16 DIAGNOSIS — I4892 Unspecified atrial flutter: Secondary | ICD-10-CM

## 2019-01-16 DIAGNOSIS — I255 Ischemic cardiomyopathy: Secondary | ICD-10-CM | POA: Diagnosis not present

## 2019-01-16 NOTE — Patient Instructions (Addendum)
Medication Instructions:  Your physician recommends that you continue on your current medications as directed. Please refer to the Current Medication list given to you today.  * If you need a refill on your cardiac medications before your next appointment, please call your pharmacy.   Labwork: None ordered  Testing/Procedures: None ordered  Follow-Up: No follow up is needed at this time with Dr. Klein.  He will see you on an as needed basis.     Thank you for choosing [facility]!!     Any Other Special Instructions Will Be Listed Below (If Applicable).        

## 2019-01-16 NOTE — Progress Notes (Signed)
Patient Care Team: Raina Mina., MD as PCP - General (Internal Medicine) Bensimhon, Shaune Pascal, MD as PCP - Cardiology (Cardiology)   HPI  David [name] is a 53 y.o. male Seen as a consultation for atrial flutter at the request of Dr. Haroldine Laws.  He has a history of an in ICD implanted 2018 for ventricular tachycardia-secondary prevention and presyncope CRT-D [facility]) was intended but anatomy precluded an LV lead  He has a history of ischemic heart disease   January 2018 he was hospitalized in transfer from [place].  Presented with atrial fibrillation and a rapid rate complicated by bradycardic arrest in the context of amiodarone.  He underwent catheterization and stenting x3 including the LAD RCA and posterolateral.  EF was 25% was noted to have complex and frequent ectopy.  He underwent EP testing at which point he was noninducible.  Was started on mexiletine and ranolazine for PVC suppression.  Amiodarone was reinitiated after mexiletine failed.  Unfortunately associated side effects but not recurrent arrhythmic events.  December 2018 he underwent implantation of an LVAD.  Postoperatively he developed acute kidney injury with concerns for positive sepsis.  Further complications include angiodysplastic bleeding from the stomach 2/19    Spring 2020 developed atrial flutter.  ECG was reviewed and suggests upright flutter waves in lead V1.  Interpretation complicated by the presence of LVAD noise  He is back on amiodarone   DATE TEST EF   11/18 LHC   % LADp40, stent-patent d80  6/20 TEE  10 %         He is unaware of his atrial arrhythmia.  Functional status is quite good.  He has mild dyspnea.  No chest pain or edema.   Records and Results Reviewed *  Past Medical History:  Diagnosis Date  . AICD (automatic cardioverter/defibrillator) present   . Anemia   . Anxiety   . CAD (coronary artery disease) 2009   AMI in 12/2007 with PCI to LAD, staged PCI to  mid/distal RCA,  NSTEMI in 02/2009 with BMS to LCx  . CHF (congestive heart failure) ([place])   . Chronic kidney disease 11/03/2016   stage 3 kidney disease  . Dysrhythmia    "arrythmia problems at some point", "fatal rhythms"  . History of blood transfusion    "I was bleeding from was rectum" (04/19/2017)  . HLD (hyperlipidemia)   . HTN (hypertension)   . Ischemic cardiomyopathy    Admitted in 07/2010 with CHF exacerbation  . Myocardial infarction ([place])    "I've had 4" (04/19/2017)  . Pneumonia 2018 X 1  . Seizures ([place])    one seizure in 04/2016 during cardiac event (04/19/2017)    Past Surgical History:  Procedure Laterality Date  . AORTIC VALVE REPAIR N/A 04/20/2017   Procedure: AORTIC VALVE REPAIR;  Surgeon: Gaye Pollack, MD;  Location: [place];  Service: Open Heart Surgery;  Laterality: N/A;  . BIV ICD INSERTION CRT-D N/A 08/01/2016   Procedure: BiV ICD Insertion CRT-D;  Surgeon: Will Meredith Leeds, MD;  Location: [place] CV LAB;  Service: Cardiovascular;  Laterality: N/A;  . CARDIAC CATHETERIZATION N/A 05/05/2016   Procedure: Right/Left Heart Cath and Coronary Angiography;  Surgeon: Jolaine Artist, MD;  Location: [place] CV LAB;  Service: Cardiovascular;  Laterality: N/A;  . CARDIAC CATHETERIZATION N/A 05/09/2016   Procedure: Coronary Stent Intervention;  Surgeon: Peter M Martinique, MD;  Location: [place] CV LAB;  Service: Cardiovascular;  Laterality: N/A;  .  CARDIAC CATHETERIZATION N/A 05/09/2016   Procedure: Intravascular Pressure Wire/FFR Study;  Surgeon: Peter M Martinique, MD;  Location: [place] CV LAB;  Service: Cardiovascular;  Laterality: N/A;  . CARDIOVERSION N/A 10/17/2018   Procedure: CARDIOVERSION;  Surgeon: Jolaine Artist, MD;  Location: [facility] ENDOSCOPY;  Service: Cardiovascular;  Laterality: N/A;  . CORONARY ANGIOPLASTY WITH STENT PLACEMENT     "i've got a total of 12 stents" (04/19/2017)  . ELECTROPHYSIOLOGY STUDY N/A 06/24/2016   Procedure: Electrophysiology Study;   Surgeon: Deboraha Sprang, MD;  Location: [place] CV LAB;  Service: Cardiovascular;  Laterality: N/A;  . ENTEROSCOPY N/A 06/03/2017   Procedure: ENTEROSCOPY;  Surgeon: Yetta Flock, MD;  Location: [facility] ENDOSCOPY;  Service: Gastroenterology;  Laterality: N/A;  . EPICARDIAL PACING LEAD PLACEMENT Left 11/07/2016   Procedure: LV EPICARDIAL PACING LEAD PLACEMENT VIA LEFT MINI THORACOTOMY;  Surgeon: Gaye Pollack, MD;  Location: [place];  Service: Thoracic;  Laterality: Left;  . ICD IMPLANT N/A 06/24/2016   Procedure: POSSIBLE  ICD Implant;  Surgeon: Deboraha Sprang, MD;  Location: [place] CV LAB;  Service: Cardiovascular;  Laterality: N/A;  . INSERTION OF IMPLANTABLE LEFT VENTRICULAR ASSIST DEVICE N/A 04/20/2017   Procedure: INSERTION OF IMPLANTABLE LEFT VENTRICULAR ASSIST DEVICE, Aortic Valve repair;  Surgeon: Gaye Pollack, MD;  Location: [facility] OR;  Service: Open Heart Surgery;  Laterality: N/A;  Heartmate 3  . IR FLUORO GUIDE CV LINE RIGHT  05/08/2017  . IR FLUORO GUIDE CV MIDLINE PICC RIGHT  03/14/2017  . IR REMOVAL TUN CV CATH W/O FL  05/25/2017  . IR US GUIDE VASC ACCESS RIGHT  03/14/2017  . IR US GUIDE VASC ACCESS RIGHT  05/08/2017  . LEFT HEART CATHETERIZATION WITH CORONARY ANGIOGRAM N/A 03/13/2011   Procedure: LEFT HEART CATHETERIZATION WITH CORONARY ANGIOGRAM;  Surgeon: Lorretta Harp, MD;  Location: [facility] CATH LAB;  Service: Cardiovascular;  Laterality: N/A;  . MULTIPLE EXTRACTIONS WITH ALVEOLOPLASTY N/A 04/12/2017   Procedure: Extraction of tooth #'s 2, 5-12, 17, 20-29 with alveoloplasty amd maxillary right buccal exostoses reductions.;  Surgeon: Lenn Cal, DDS;  Location: [place];  Service: Oral Surgery;  Laterality: N/A;  . RIGHT HEART CATH N/A 04/19/2017   Procedure: RIGHT HEART CATH;  Surgeon: Jolaine Artist, MD;  Location: [place] CV LAB;  Service: Cardiovascular;  Laterality: N/A;  . RIGHT/LEFT HEART CATH AND CORONARY ANGIOGRAPHY N/A 03/10/2017   Procedure:  RIGHT/LEFT HEART CATH AND CORONARY ANGIOGRAPHY;  Surgeon: Jolaine Artist, MD;  Location: [place] CV LAB;  Service: Cardiovascular;  Laterality: N/A;  . TEE WITHOUT CARDIOVERSION N/A 04/20/2017   Procedure: TRANSESOPHAGEAL ECHOCARDIOGRAM (TEE);  Surgeon: Gaye Pollack, MD;  Location: [place];  Service: Open Heart Surgery;  Laterality: N/A;  . TEE WITHOUT CARDIOVERSION N/A 10/17/2018   Procedure: TRANSESOPHAGEAL ECHOCARDIOGRAM (TEE);  Surgeon: Jolaine Artist, MD;  Location: [facility] ENDOSCOPY;  Service: Cardiovascular;  Laterality: N/A;    Current Outpatient Medications  Medication Sig Dispense Refill  . amiodarone (PACERONE) 200 MG tablet Take 1 tablet (200 mg total) by mouth daily. 90 tablet 3  . atorvastatin (LIPITOR) 40 MG tablet TAKE 1 TABLET BY MOUTH ONCE DAILY AT  6  PM. 90 tablet 2  . furosemide (LASIX) 40 MG tablet Take 1 tablet (40 mg total) by mouth daily. KEEP OV. 90 tablet 3  . gabapentin (NEURONTIN) 300 MG capsule Take 1 capsule (300 mg total) by mouth 2 (two) times daily. 60 capsule 6  . losartan (COZAAR)  50 MG tablet Take 1 tablet (50 mg total) by mouth daily. 90 tablet 3  . magnesium oxide (MAG-OX) 400 MG tablet Take 400 mg by mouth 2 (two) times daily.    . Multiple Vitamins-Minerals (ADULT GUMMY) CHEW Chew 2 tablets by mouth daily.    . pantoprazole (PROTONIX) 40 MG tablet Take 1 tablet (40 mg total) by mouth daily. 210 tablet 0  . potassium chloride SA (KLOR-CON M20) 20 MEQ tablet Take 1 tablet (20 mEq total) by mouth daily. KEEP OV. 90 tablet 3  . traZODone (DESYREL) 50 MG tablet Take 1 tablet (50 mg total) by mouth at bedtime. 30 tablet 5  . vitamin C (ASCORBIC ACID) 500 MG tablet Take 500 mg by mouth daily as needed (pt prefrence).    . warfarin (COUMADIN) 2 MG tablet Take 2 tablets (4 mg total) by mouth daily. Or as directed (Patient taking differently: Take 2 mg by mouth See admin instructions. 2 mg daily Or as directed by HF clinic) 75 tablet 4   No current  facility-administered medications for this visit.     Allergies  Allergen Reactions  . Plavix [Clopidogrel Bisulfate] Hives      Review of Systems negative except from HPI and PMH  Physical Exam Pulse (!) 56   Ht 5' (1.524 m)   Wt 175 lb 3.2 oz (79.5 kg)   SpO2 97%   BMI 34.22 kg/m   Well developed and nourished in no acute distress HENT normal Neck supple with JVP  Clear Rapid regular rate and LVAD HUM Abd-soft with active BS No Clubbing cyanosis edema Skin-warm and dry A & Oriented  Grossly normal sensory and motor function  ECG Atrial flutter typical flutter waves lead V1 and negative flutter waves in the inferior leads   Assessment and  Plan Ischemic cardiomyopathy  PVCs  Atrial flutter-typical  Nonsustained ventricular tachycardia-noninducible  Congestive heart failure-chronic-systolic  VT paceterminated 6/20  LV epicardial lead failure-- unmeasurable impedance since 12/18  LVAD   The patient is referred for consideration of ablation of his atrial flutter.  Amiodarone has been initiated for rhythm control.  Reviewed the procedure with his wife and with him.  She is reluctant to undergo another procedure at this point having "been going through hell "with all the challenges in the last 18 months.  She would like to pursue a less drastic option, specifically, wanting to use the amiodarone for rhythm control.  I suggested that we could undertake cardioversion following 3 weeks of therapeutic INR and if this fails we could pursue catheter ablation at that time.  We also reviewed the potential complications of long-term amiodarone.  For now she would like to continue on this course.  The patient is more inclined towards catheter ablation.  It is noted that the patient's LV lead does not work and has not worked almost since it was implanted  ECG pre implant showed an IVCD with Q waves in lead I and L     Current medicines are reviewed at length with the  patient today .  The patient does not have concerns regarding medicines.

## 2019-01-18 LAB — CUP PACEART INCLINIC DEVICE CHECK
Battery Remaining Longevity: 83 mo
Battery Voltage: 2.98 V
Brady Statistic AP VP Percent: 0 %
Brady Statistic AP VS Percent: 0 %
Brady Statistic AS VP Percent: 0.03 %
Brady Statistic AS VS Percent: 99.97 %
Brady Statistic RA Percent Paced: 0 %
Brady Statistic RV Percent Paced: 0.06 %
Date Time Interrogation Session: 20200923172416
HighPow Impedance: 71 Ohm
Implantable Lead Implant Date: 20180409
Implantable Lead Implant Date: 20180409
Implantable Lead Implant Date: 20180716
Implantable Lead Implant Date: 20180716
Implantable Lead Location: 753858
Implantable Lead Location: 753858
Implantable Lead Location: 753859
Implantable Lead Location: 753860
Implantable Lead Model: 5076
Implantable Lead Model: 511212
Implantable Lead Model: 511212
Implantable Lead Serial Number: 263773
Implantable Lead Serial Number: 263775
Implantable Pulse Generator Implant Date: 20180409
Lead Channel Impedance Value: 2850 Ohm
Lead Channel Impedance Value: 304 Ohm
Lead Channel Impedance Value: 399 Ohm
Lead Channel Impedance Value: 4047 Ohm
Lead Channel Impedance Value: 456 Ohm
Lead Channel Impedance Value: 722 Ohm
Lead Channel Sensing Intrinsic Amplitude: 15.5 mV
Lead Channel Sensing Intrinsic Amplitude: 2.875 mV
Lead Channel Setting Pacing Amplitude: 2.5 V
Lead Channel Setting Pacing Amplitude: 3.5 V
Lead Channel Setting Pacing Pulse Width: 0.4 ms
Lead Channel Setting Pacing Pulse Width: 1 ms
Lead Channel Setting Sensing Sensitivity: 0.3 mV

## 2019-01-21 ENCOUNTER — Ambulatory Visit (HOSPITAL_COMMUNITY): Payer: Self-pay | Admitting: Pharmacist

## 2019-01-21 DIAGNOSIS — Z95811 Presence of heart assist device: Secondary | ICD-10-CM

## 2019-01-21 LAB — POCT INR: INR: 2.1 (ref 2.0–3.0)

## 2019-01-21 NOTE — Progress Notes (Signed)
LVAD INR 

## 2019-01-28 ENCOUNTER — Ambulatory Visit (HOSPITAL_COMMUNITY): Payer: Self-pay | Admitting: Pharmacist

## 2019-01-28 DIAGNOSIS — Z95811 Presence of heart assist device: Secondary | ICD-10-CM

## 2019-01-28 LAB — POCT INR: INR: 1.9 — AB (ref 2.0–3.0)

## 2019-01-28 NOTE — Progress Notes (Signed)
LVAD INR 

## 2019-01-30 ENCOUNTER — Other Ambulatory Visit: Payer: Self-pay | Admitting: Internal Medicine

## 2019-01-30 ENCOUNTER — Ambulatory Visit (INDEPENDENT_AMBULATORY_CARE_PROVIDER_SITE_OTHER): Payer: Medicare HMO | Admitting: *Deleted

## 2019-01-30 DIAGNOSIS — I255 Ischemic cardiomyopathy: Secondary | ICD-10-CM | POA: Diagnosis not present

## 2019-01-31 ENCOUNTER — Other Ambulatory Visit (HOSPITAL_COMMUNITY): Payer: Self-pay | Admitting: *Deleted

## 2019-01-31 NOTE — Progress Notes (Signed)
Spoke with patient's wife regarding scheduled cardioversion 02/06/19 at 0930. Instructed to check in at admitting an hour before procedure. NPO past MN. She verbalized understanding of all instructions.  Emerson Monte RN [place] Coordinator  Office: [phone]  24/7 Pager: [phone]

## 2019-02-01 ENCOUNTER — Encounter

## 2019-02-01 ENCOUNTER — Encounter: Payer: Medicare HMO | Admitting: Internal Medicine

## 2019-02-01 ENCOUNTER — Other Ambulatory Visit (HOSPITAL_COMMUNITY): Payer: Self-pay | Admitting: *Deleted

## 2019-02-02 LAB — CUP PACEART REMOTE DEVICE CHECK
Battery Remaining Longevity: 82 mo
Battery Voltage: 2.98 V
Brady Statistic AP VP Percent: 0 %
Brady Statistic AP VS Percent: 0 %
Brady Statistic AS VP Percent: 0.15 %
Brady Statistic AS VS Percent: 99.85 %
Brady Statistic RA Percent Paced: 0 %
Brady Statistic RV Percent Paced: 0.3 %
Date Time Interrogation Session: 20201009154643
HighPow Impedance: 70 Ohm
Implantable Lead Implant Date: 20180409
Implantable Lead Implant Date: 20180409
Implantable Lead Implant Date: 20180716
Implantable Lead Implant Date: 20180716
Implantable Lead Location: 753858
Implantable Lead Location: 753858
Implantable Lead Location: 753859
Implantable Lead Location: 753860
Implantable Lead Model: 5076
Implantable Lead Model: 511212
Implantable Lead Model: 511212
Implantable Lead Serial Number: 263773
Implantable Lead Serial Number: 263775
Implantable Pulse Generator Implant Date: 20180409
Lead Channel Impedance Value: 304 Ohm
Lead Channel Impedance Value: 3363 Ohm
Lead Channel Impedance Value: 380 Ohm
Lead Channel Impedance Value: 4047 Ohm
Lead Channel Impedance Value: 456 Ohm
Lead Channel Impedance Value: 722 Ohm
Lead Channel Pacing Threshold Amplitude: 0.625 V
Lead Channel Pacing Threshold Amplitude: 0.875 V
Lead Channel Pacing Threshold Amplitude: 1.75 V
Lead Channel Pacing Threshold Pulse Width: 0.4 ms
Lead Channel Pacing Threshold Pulse Width: 0.4 ms
Lead Channel Pacing Threshold Pulse Width: 1 ms
Lead Channel Sensing Intrinsic Amplitude: 12.75 mV
Lead Channel Sensing Intrinsic Amplitude: 12.75 mV
Lead Channel Sensing Intrinsic Amplitude: 3.125 mV
Lead Channel Sensing Intrinsic Amplitude: 3.125 mV
Lead Channel Setting Pacing Amplitude: 2.5 V
Lead Channel Setting Pacing Amplitude: 3.5 V
Lead Channel Setting Pacing Pulse Width: 0.4 ms
Lead Channel Setting Pacing Pulse Width: 1 ms
Lead Channel Setting Sensing Sensitivity: 0.3 mV

## 2019-02-04 ENCOUNTER — Telehealth (HOSPITAL_COMMUNITY): Payer: Self-pay | Admitting: *Deleted

## 2019-02-04 ENCOUNTER — Ambulatory Visit (HOSPITAL_COMMUNITY): Payer: Self-pay | Admitting: Pharmacist

## 2019-02-04 ENCOUNTER — Other Ambulatory Visit (HOSPITAL_COMMUNITY): Payer: Medicare HMO

## 2019-02-04 DIAGNOSIS — Z95811 Presence of heart assist device: Secondary | ICD-10-CM

## 2019-02-04 LAB — POCT INR: INR: 2.1 (ref 2.0–3.0)

## 2019-02-04 NOTE — Telephone Encounter (Signed)
Wife called VAD pager to cancel pts' scheduled COVID testing for today and cardioversion on 02/06/19. She reports pt feels he's been "through so much", he "can't take any more" procedures at this time. She reports pt does not want specifically the COVID test. Feels it is too much to have done prior to every procedure. Pt has f/u scheduled 02/18/19 in [facility]. Advised wife to call if any issues prior to visit. Dr. Haroldine Laws updated.  Zada Girt RN, [place] Coordinator [phone]

## 2019-02-04 NOTE — Progress Notes (Signed)
LVAD INR 

## 2019-02-06 ENCOUNTER — Encounter (HOSPITAL_COMMUNITY): Admission: RE | Payer: Self-pay | Source: Home / Self Care

## 2019-02-06 ENCOUNTER — Ambulatory Visit (HOSPITAL_COMMUNITY): Admission: RE | Admit: 2019-02-06 | Payer: Medicare HMO | Source: Home / Self Care | Admitting: Internal Medicine

## 2019-02-06 ENCOUNTER — Inpatient Hospital Stay (HOSPITAL_COMMUNITY): Admission: RE | Admit: 2019-02-06 | Payer: Medicare HMO | Source: Ambulatory Visit

## 2019-02-06 SURGERY — CARDIOVERSION
Anesthesia: General

## 2019-02-08 NOTE — Progress Notes (Signed)
Remote ICD transmission.   

## 2019-02-11 ENCOUNTER — Ambulatory Visit (HOSPITAL_COMMUNITY): Payer: Self-pay | Admitting: Pharmacist

## 2019-02-11 DIAGNOSIS — Z95811 Presence of heart assist device: Secondary | ICD-10-CM

## 2019-02-11 LAB — POCT INR: INR: 2 (ref 2.0–3.0)

## 2019-02-11 NOTE — Progress Notes (Signed)
LVAD INR 

## 2019-02-15 ENCOUNTER — Other Ambulatory Visit (HOSPITAL_COMMUNITY): Payer: Self-pay | Admitting: *Deleted

## 2019-02-15 DIAGNOSIS — Z95811 Presence of heart assist device: Secondary | ICD-10-CM

## 2019-02-15 DIAGNOSIS — Z7901 Long term (current) use of anticoagulants: Secondary | ICD-10-CM

## 2019-02-18 ENCOUNTER — Ambulatory Visit (HOSPITAL_COMMUNITY): Payer: Self-pay | Admitting: Pharmacist

## 2019-02-18 ENCOUNTER — Encounter (HOSPITAL_COMMUNITY): Payer: Medicare HMO

## 2019-02-18 DIAGNOSIS — Z95811 Presence of heart assist device: Secondary | ICD-10-CM

## 2019-02-18 LAB — POCT INR: INR: 1.9 — AB (ref 2.0–3.0)

## 2019-02-18 NOTE — Progress Notes (Signed)
LVAD INR 

## 2019-02-25 ENCOUNTER — Ambulatory Visit (HOSPITAL_COMMUNITY): Payer: Self-pay | Admitting: Pharmacist

## 2019-02-25 ENCOUNTER — Other Ambulatory Visit (HOSPITAL_COMMUNITY): Payer: Self-pay | Admitting: Internal Medicine

## 2019-02-25 ENCOUNTER — Other Ambulatory Visit (HOSPITAL_COMMUNITY): Payer: Self-pay | Admitting: Pharmacist

## 2019-02-25 DIAGNOSIS — Z95811 Presence of heart assist device: Secondary | ICD-10-CM

## 2019-02-25 LAB — POCT INR: INR: 2.1 (ref 2.0–3.0)

## 2019-02-25 MED ORDER — ATORVASTATIN CALCIUM 40 MG PO TABS: ORAL_TABLET | ORAL | 3 refills | Status: DC

## 2019-02-25 NOTE — Progress Notes (Signed)
LVAD INR 

## 2019-03-04 ENCOUNTER — Ambulatory Visit (HOSPITAL_COMMUNITY): Payer: Self-pay | Admitting: Pharmacist

## 2019-03-04 DIAGNOSIS — Z95811 Presence of heart assist device: Secondary | ICD-10-CM

## 2019-03-04 LAB — POCT INR: INR: 2.7 (ref 2.0–3.0)

## 2019-03-04 NOTE — Progress Notes (Signed)
LVAD INR 

## 2019-03-05 ENCOUNTER — Other Ambulatory Visit (HOSPITAL_COMMUNITY): Payer: Self-pay | Admitting: *Deleted

## 2019-03-05 DIAGNOSIS — Z79899 Other long term (current) drug therapy: Secondary | ICD-10-CM

## 2019-03-05 DIAGNOSIS — Z95811 Presence of heart assist device: Secondary | ICD-10-CM

## 2019-03-05 DIAGNOSIS — Z7901 Long term (current) use of anticoagulants: Secondary | ICD-10-CM

## 2019-03-06 ENCOUNTER — Other Ambulatory Visit: Payer: Self-pay

## 2019-03-06 ENCOUNTER — Ambulatory Visit (HOSPITAL_COMMUNITY)
Admission: RE | Admit: 2019-03-06 | Discharge: 2019-03-06 | Disposition: A | Discharge status: Home / Self Care | Payer: Medicare HMO | Source: Ambulatory Visit | Attending: Cardiology | Admitting: Cardiology

## 2019-03-06 VITALS — BP 90/0 | HR 74 | Ht 65.0 in | Wt 177.4 lb

## 2019-03-06 DIAGNOSIS — I130 Hypertensive heart and chronic kidney disease with heart failure and stage 1 through stage 4 chronic kidney disease, or unspecified chronic kidney disease: Secondary | ICD-10-CM | POA: Diagnosis not present

## 2019-03-06 DIAGNOSIS — I4892 Unspecified atrial flutter: Secondary | ICD-10-CM

## 2019-03-06 DIAGNOSIS — Z95811 Presence of heart assist device: Secondary | ICD-10-CM | POA: Insufficient documentation

## 2019-03-06 DIAGNOSIS — E785 Hyperlipidemia, unspecified: Secondary | ICD-10-CM | POA: Insufficient documentation

## 2019-03-06 DIAGNOSIS — D649 Anemia, unspecified: Secondary | ICD-10-CM | POA: Insufficient documentation

## 2019-03-06 DIAGNOSIS — I2510 Atherosclerotic heart disease of native coronary artery without angina pectoris: Secondary | ICD-10-CM | POA: Insufficient documentation

## 2019-03-06 DIAGNOSIS — I255 Ischemic cardiomyopathy: Secondary | ICD-10-CM | POA: Insufficient documentation

## 2019-03-06 DIAGNOSIS — I252 Old myocardial infarction: Secondary | ICD-10-CM | POA: Diagnosis not present

## 2019-03-06 DIAGNOSIS — Z7901 Long term (current) use of anticoagulants: Secondary | ICD-10-CM | POA: Insufficient documentation

## 2019-03-06 DIAGNOSIS — Z79899 Other long term (current) drug therapy: Secondary | ICD-10-CM | POA: Diagnosis not present

## 2019-03-06 DIAGNOSIS — I5022 Chronic systolic (congestive) heart failure: Secondary | ICD-10-CM | POA: Insufficient documentation

## 2019-03-06 DIAGNOSIS — Z95810 Presence of automatic (implantable) cardiac defibrillator: Secondary | ICD-10-CM | POA: Diagnosis not present

## 2019-03-06 DIAGNOSIS — N179 Acute kidney failure, unspecified: Secondary | ICD-10-CM | POA: Diagnosis not present

## 2019-03-06 DIAGNOSIS — Z955 Presence of coronary angioplasty implant and graft: Secondary | ICD-10-CM | POA: Diagnosis not present

## 2019-03-06 DIAGNOSIS — N189 Chronic kidney disease, unspecified: Secondary | ICD-10-CM | POA: Diagnosis not present

## 2019-03-06 LAB — COMPREHENSIVE METABOLIC PANEL
ALT: 29 U/L (ref 0–44)
AST: 26 U/L (ref 15–41)
Albumin: 3.2 g/dL — ABNORMAL LOW (ref 3.5–5.0)
Alkaline Phosphatase: 144 U/L — ABNORMAL HIGH (ref 38–126)
Anion gap: 10 (ref 5–15)
BUN: 16 mg/dL (ref 6–20)
CO2: 22 mmol/L (ref 22–32)
Calcium: 8.8 mg/dL — ABNORMAL LOW (ref 8.9–10.3)
Chloride: 106 mmol/L (ref 98–111)
Creatinine, Ser: 1.56 mg/dL — ABNORMAL HIGH (ref 0.61–1.24)
GFR calc Af Amer: 58 mL/min — ABNORMAL LOW (ref >60)
GFR calc non Af Amer: 50 mL/min — ABNORMAL LOW (ref >60)
Glucose, Bld: 93 mg/dL (ref 70–99)
Potassium: 3.8 mmol/L (ref 3.5–5.1)
Sodium: 138 mmol/L (ref 135–145)
Total Bilirubin: 1 mg/dL (ref 0.3–1.2)
Total Protein: 8 g/dL (ref 6.5–8.1)

## 2019-03-06 LAB — CBC
HCT: 50.7 % (ref 39.0–52.0)
Hemoglobin: 16 g/dL (ref 13.0–17.0)
MCH: 25.9 pg — ABNORMAL LOW (ref 26.0–34.0)
MCHC: 31.6 g/dL (ref 30.0–36.0)
MCV: 82.2 fL (ref 80.0–100.0)
Platelets: 203 10*3/uL (ref 150–400)
RBC: 6.17 MIL/uL — ABNORMAL HIGH (ref 4.22–5.81)
RDW: 17.2 % — ABNORMAL HIGH (ref 11.5–15.5)
WBC: 8.8 10*3/uL (ref 4.0–10.5)
nRBC: 0 % (ref 0.0–0.2)

## 2019-03-06 LAB — LACTATE DEHYDROGENASE: LDH: 232 U/L — ABNORMAL HIGH (ref 98–192)

## 2019-03-06 LAB — MAGNESIUM: Magnesium: 2 mg/dL (ref 1.7–2.4)

## 2019-03-06 LAB — PROTIME-INR
INR: 1.6 — ABNORMAL HIGH (ref 0.8–1.2)
Prothrombin Time: 19 seconds — ABNORMAL HIGH (ref 11.4–15.2)

## 2019-03-06 NOTE — Progress Notes (Signed)
Patient presents for 2 month follow up in [facility] today with his wife. Reports no problems with VAD equipment or concerns with drive line.    Pt states that he has been doing well since his last visit. He states that he is feeling great and has a lot of energy.    Pt is still in aflutter today. Rate is much lower in 60-70s. Pt asymptomatic. EKG performed in clinic today. Pt has been taking Amio 200 mg daily.  Vital Signs:  Doppler Pressure: 90 Automatc BP: 111/92 (99)  HR: 60-70s aflutter SPO2: 97% RA  Weight: 177.4 lb w/oeqt Last weight: 176.5 lb Home weights: 168 - 169 lbs   VAD Indication: Destination therapy-   CKD  VAD interrogation & Equipment Management: Speed: 5400 Flow: 4.4  Power: 4.1 w    PI: 4.5  Alarms: none Events:  5-10 PI events daily  Fixed speed 5400 Low speed limit: 5100  Primary Controller:  Replace back up battery in 9 months. Back up controller:   Replace back up battery in 12 months.  Annual Equipment Maintenance on UBC/PM was performed on 05/2018.   I reviewed the LVAD parameters from today and compared the results to the patient's prior recorded data. LVAD interrogation was NEGATIVE for significant power changes, POSITIVE for clinical alarms and STABLE for PI events/speed drops. No programming changes were made and pump is functioning within specified parameters. Pt is performing daily controller and system monitor self tests along with completing weekly and monthly maintenance for LVAD equipment.  LVAD equipment check completed and is in good working order. Back-up equipment not present today in clinic.    Exit Site Care: Drive line is being maintained weekly by [name], his wife. Exit site healed and incorporated, the velour is fully implanted at exit site. No redness, drainage or foul odor noted. Pt denies fever or chills. Pt given 8 weekly dressings and 6 anchors at this visit.   Significant Events on VAD Support:   none  Device:Medtronic Bi-V Therapies: on Last check: 05/30/2017   BP & Labs:  Doppler BP 90 - Doppler is reflecting Modified systolic.  Hgb 16 - No S/S of bleeding. Specifically denies melena/BRBPR or nosebleeds.  LDH stable at 232with established baseline of 165- 250. Denies tea-colored urine. No power elevations noted on interrogation.    Patient Instructions:  1. No medication changes today. 2. Coumadin dosing per Ander Purpura PharmD 3. Return to [place] clinic in 1 month   Tanda Rockers RN [place] Coordinator  Office: [phone]  24/7 Pager: [phone]

## 2019-03-06 NOTE — Progress Notes (Signed)
LVAD Clinic Follow Up Note  HPI:  David [name] is a 53 y.o. male with history of ICD (09 PCI to LAD, mRCA, '10 PCI to Lcx and '12 mRCA), HFrEF (EF ~20% for >5 years), HLD and HTN. S/P LVAD HM-III 04/20/18.   Admitted 04/19/17 through 05/27/2017 for scheduled LVAD implant. On 12/27 he underwent HMIII LVAD implant. Hospital course complicated by PNA with achromobacter tracheal aspirate, shock liver, and AKI. Required short term dialysis.  Readmitted 05/30/17 with symptomatic anemia hgb 3.8.  INR 2.9. He was transfused with 4 units of packed red blood cell. 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  It was suspected AVMs were the likely cause of bleeding in the setting of anticoagulation.  He will need colonoscopy at some point, this can be done as outpatient. Discharged 06/04/17.  Underwent DC-CV for AFL on 10/17/18   Follow up for Heart Failure/LVAD:  Presents for routine f/u with his wife. Since we last saw him he has seen Dr. Caryl Comes for his AFL. DC-CV arranged but Mr. Misko cancelled because he says he feels great. Remains on amio 200 daily. Denies orthopnea or PND. No fevers, chills or problems with driveline. No bleeding, melena or neuro symptoms. No VAD alarms. Taking all meds as prescribed.    VAD Indication: Destination therapy-   CKD  VAD interrogation & Equipment Management: Speed: 5400 Flow: 4.4  Power: 4.1 w PI: 4.5  Alarms: none Events:  5-10 PI events daily  Fixed speed 5400 Low speed limit: 5100  Primary Controller: Replace back up battery in 9 months. Back up controller: Replace back up battery in 74months.  Annual Equipment Maintenance on UBC/PM was performed on 05/2018.  Past Medical History:  Diagnosis Date  . AICD (automatic cardioverter/defibrillator) present   . Anemia   . Anxiety    . CAD (coronary artery disease) 2009   AMI in 12/2007 with PCI to LAD, staged PCI to  mid/distal RCA, NSTEMI in 02/2009 with BMS to LCx  . CHF (congestive heart failure) ([place])   . Chronic kidney disease 11/03/2016   stage 3 kidney disease  . Dysrhythmia    "arrythmia problems at some point", "fatal rhythms"  . History of blood transfusion    "I was bleeding from was rectum" (04/19/2017)  . HLD (hyperlipidemia)   . HTN (hypertension)   . Ischemic cardiomyopathy    Admitted in 07/2010 with CHF exacerbation  . Myocardial infarction ([place])    "I've had 4" (04/19/2017)  . Pneumonia 2018 X 1  . Seizures ([place])    one seizure in 04/2016 during cardiac event (04/19/2017)    Current Outpatient Medications  Medication Sig Dispense Refill  . amiodarone (PACERONE) 200 MG tablet Take 1 tablet (200 mg total) by mouth daily. 90 tablet 3  . atorvastatin (LIPITOR) 40 MG tablet Take 1 tab daily 90 tablet 3  . furosemide (LASIX) 40 MG tablet Take 1 tablet (40 mg total) by mouth daily. KEEP OV. 90 tablet 3  . gabapentin (NEURONTIN) 300 MG capsule Take 1 capsule (300 mg total) by mouth 2 (two) times daily. 60 capsule 6  . losartan (COZAAR) 50 MG tablet Take 1 tablet (50 mg total) by mouth daily. 90 tablet 3  . magnesium oxide (MAG-OX) 400 MG tablet Take 400 mg by mouth 2 (two) times daily.    . Multiple Vitamins-Minerals (ADULT GUMMY) CHEW Chew  2 tablets by mouth daily.    . pantoprazole (PROTONIX) 40 MG tablet Take 1 tablet (40 mg total) by mouth daily. 210 tablet 0  . potassium chloride SA (KLOR-CON M20) 20 MEQ tablet Take 1 tablet (20 mEq total) by mouth daily. KEEP OV. 90 tablet 3  . traZODone (DESYREL) 50 MG tablet Take 1 tablet (50 mg total) by mouth at bedtime. 30 tablet 5  . vitamin C (ASCORBIC ACID) 500 MG tablet Take 500 mg by mouth daily as needed (pt prefrence).    . warfarin (COUMADIN) 2 MG tablet Take 2 tablets (4 mg total) by mouth daily. Or as directed (Patient taking differently: Take 2 mg  by mouth See admin instructions. 1 mg on Monday and 2 mg AOD) 75 tablet 4   No current facility-administered medications for this encounter.     Plavix [clopidogrel bisulfate]  Review of systems complete and found to be negative unless listed in HPI.     Vital Signs:  Doppler Pressure: 90 Automatc BP: 111/92 (99)  HR: 60-70s aflutter SPO2: 97% RA  Weight: 177.4 lb w/oeqt Last weight: 176.5 lb Home weights: 168 - 169 lbs  Physical exam: General:  NAD.  HEENT: normal  Neck: supple. JVP not elevated.  Carotids 2+ bilat; no bruits. No lymphadenopathy or thryomegaly appreciated. Cor: LVAD hum.  Lungs: Clear. Abdomen: obese soft, nontender, non-distended. No hepatosplenomegaly. No bruits or masses. Good bowel sounds. Driveline site clean. Anchor in place.  Extremities: no cyanosis, clubbing, rash. Warm no edema  Neuro: alert & oriented x 3. No focal deficits. Moves all 4 without problem    ECG AFL 66 Personally reviewed    ASSESSMENT AND PLAN:   1. Chronic systolic CHF with HM III LVAD implant 04/20/17:  - Doing very well with VAD support. - NYHA I - Volume status looks great. Continue lasix 40mg  daily.  - He has been seen at [facility] for transplant referral but has decided not to pursue VAD for now   2. Recurrent AFL with RVR - had DC-CV on 10/17/18. - Back in AFL. Rate now well controlled on a,io 200 daily - ICD interrogated in clinic and found to be in AFL since 10/29/18. AFL persists but rate now down  - Continues to refuse DC-CV  3. Symptomatic anemia to UGI AVM bleed - s/p APC x 4 lesions in 2/19 - Hgb stable today at  16.0 - No evidence of active bleeding - Continue PPI - Off ASA due to GIB.   4. H/O of AKI on CKD Stage III:  - Creatinine stable at 1.56  today (baseline 1.3 - 1.5)  5. VAD management - VAD interrogated personally. Parameters stable. - LDH 232 - INR goal 2.0-2.5 Off ASA due to GIB 2/19.  - INR 1.6 this am. Discussed dosing with PharmD  personally. - MAPs look good 80-90s  6. CAD:  s/p PCI RCA/PLOM with DES x2 and DES to mild LAD 1/18 - No s/s of ischemia - Continue statin. Off ASA due to GIB  7. Hypomagnesemia. - Mag 2.0 today. Continue magnesium oxide 400 TID.   Total time spent 35 minutes. Over half that time spent discussing above.   Glori Bickers, MD  2:34 PM  .

## 2019-03-11 ENCOUNTER — Ambulatory Visit (HOSPITAL_COMMUNITY): Payer: Self-pay | Admitting: Pharmacist

## 2019-03-11 DIAGNOSIS — Z95811 Presence of heart assist device: Secondary | ICD-10-CM

## 2019-03-11 LAB — POCT INR: INR: 2.1 (ref 2.0–3.0)

## 2019-03-11 NOTE — Progress Notes (Signed)
LVAD INR 

## 2019-03-18 ENCOUNTER — Ambulatory Visit (HOSPITAL_COMMUNITY): Payer: Self-pay | Admitting: Pharmacist

## 2019-03-18 DIAGNOSIS — Z95811 Presence of heart assist device: Secondary | ICD-10-CM

## 2019-03-18 LAB — POCT INR: INR: 2.2 (ref 2.0–3.0)

## 2019-03-18 NOTE — Progress Notes (Signed)
LVAD INR 

## 2019-03-25 ENCOUNTER — Ambulatory Visit (HOSPITAL_COMMUNITY): Payer: Self-pay | Admitting: Pharmacist

## 2019-03-25 DIAGNOSIS — Z95811 Presence of heart assist device: Secondary | ICD-10-CM

## 2019-03-25 LAB — POCT INR: INR: 2.3 (ref 2.0–3.0)

## 2019-03-25 NOTE — Progress Notes (Signed)
LVAD INR 

## 2019-04-02 ENCOUNTER — Ambulatory Visit (HOSPITAL_COMMUNITY): Payer: Self-pay | Admitting: Pharmacist

## 2019-04-02 DIAGNOSIS — Z95811 Presence of heart assist device: Secondary | ICD-10-CM

## 2019-04-02 LAB — POCT INR: INR: 2.4 (ref 2.0–3.0)

## 2019-04-02 NOTE — Progress Notes (Signed)
LVAD INR 

## 2019-04-08 ENCOUNTER — Ambulatory Visit (HOSPITAL_COMMUNITY): Payer: Self-pay | Admitting: Pharmacist

## 2019-04-08 ENCOUNTER — Other Ambulatory Visit (HOSPITAL_COMMUNITY): Payer: Self-pay | Admitting: Unknown Physician Specialty

## 2019-04-08 DIAGNOSIS — Z95811 Presence of heart assist device: Secondary | ICD-10-CM

## 2019-04-08 DIAGNOSIS — Z7901 Long term (current) use of anticoagulants: Secondary | ICD-10-CM

## 2019-04-08 LAB — POCT INR: INR: 2.6 (ref 2.0–3.0)

## 2019-04-08 NOTE — Progress Notes (Signed)
LVAD INR 

## 2019-04-09 ENCOUNTER — Ambulatory Visit (HOSPITAL_COMMUNITY)
Admission: RE | Admit: 2019-04-09 | Discharge: 2019-04-09 | Disposition: A | Discharge status: Home / Self Care | Payer: Medicare HMO | Source: Ambulatory Visit | Attending: Cardiology | Admitting: Cardiology

## 2019-04-09 ENCOUNTER — Other Ambulatory Visit: Payer: Self-pay

## 2019-04-09 ENCOUNTER — Encounter (HOSPITAL_COMMUNITY): Payer: Self-pay

## 2019-04-09 VITALS — BP 121/71 | HR 65 | Temp 98.4°F | Wt 177.8 lb

## 2019-04-09 DIAGNOSIS — I2510 Atherosclerotic heart disease of native coronary artery without angina pectoris: Secondary | ICD-10-CM | POA: Diagnosis not present

## 2019-04-09 DIAGNOSIS — Z95811 Presence of heart assist device: Secondary | ICD-10-CM | POA: Diagnosis not present

## 2019-04-09 DIAGNOSIS — I252 Old myocardial infarction: Secondary | ICD-10-CM | POA: Diagnosis not present

## 2019-04-09 DIAGNOSIS — Z7901 Long term (current) use of anticoagulants: Secondary | ICD-10-CM | POA: Insufficient documentation

## 2019-04-09 DIAGNOSIS — E785 Hyperlipidemia, unspecified: Secondary | ICD-10-CM | POA: Insufficient documentation

## 2019-04-09 DIAGNOSIS — Z95810 Presence of automatic (implantable) cardiac defibrillator: Secondary | ICD-10-CM | POA: Insufficient documentation

## 2019-04-09 DIAGNOSIS — I5022 Chronic systolic (congestive) heart failure: Secondary | ICD-10-CM | POA: Insufficient documentation

## 2019-04-09 DIAGNOSIS — I255 Ischemic cardiomyopathy: Secondary | ICD-10-CM | POA: Diagnosis not present

## 2019-04-09 DIAGNOSIS — Z955 Presence of coronary angioplasty implant and graft: Secondary | ICD-10-CM | POA: Insufficient documentation

## 2019-04-09 DIAGNOSIS — R000 Tachycardia, unspecified: Secondary | ICD-10-CM | POA: Insufficient documentation

## 2019-04-09 DIAGNOSIS — Z87448 Personal history of other diseases of urinary system: Secondary | ICD-10-CM | POA: Diagnosis not present

## 2019-04-09 DIAGNOSIS — R569 Unspecified convulsions: Secondary | ICD-10-CM | POA: Diagnosis not present

## 2019-04-09 DIAGNOSIS — N183 Chronic kidney disease, stage 3 unspecified: Secondary | ICD-10-CM | POA: Insufficient documentation

## 2019-04-09 DIAGNOSIS — I4892 Unspecified atrial flutter: Secondary | ICD-10-CM | POA: Diagnosis not present

## 2019-04-09 DIAGNOSIS — I130 Hypertensive heart and chronic kidney disease with heart failure and stage 1 through stage 4 chronic kidney disease, or unspecified chronic kidney disease: Secondary | ICD-10-CM | POA: Insufficient documentation

## 2019-04-09 DIAGNOSIS — D649 Anemia, unspecified: Secondary | ICD-10-CM | POA: Insufficient documentation

## 2019-04-09 DIAGNOSIS — I10 Essential (primary) hypertension: Secondary | ICD-10-CM

## 2019-04-09 DIAGNOSIS — Q2733 Arteriovenous malformation of digestive system vessel: Secondary | ICD-10-CM | POA: Insufficient documentation

## 2019-04-09 DIAGNOSIS — Z79899 Other long term (current) drug therapy: Secondary | ICD-10-CM | POA: Insufficient documentation

## 2019-04-09 LAB — CBC
HCT: 53.2 % — ABNORMAL HIGH (ref 39.0–52.0)
Hemoglobin: 16.7 g/dL (ref 13.0–17.0)
MCH: 26.3 pg (ref 26.0–34.0)
MCHC: 31.4 g/dL (ref 30.0–36.0)
MCV: 83.6 fL (ref 80.0–100.0)
Platelets: 188 10*3/uL (ref 150–400)
RBC: 6.36 MIL/uL — ABNORMAL HIGH (ref 4.22–5.81)
RDW: 16.3 % — ABNORMAL HIGH (ref 11.5–15.5)
WBC: 9.5 10*3/uL (ref 4.0–10.5)
nRBC: 0 % (ref 0.0–0.2)

## 2019-04-09 LAB — MAGNESIUM: Magnesium: 2 mg/dL (ref 1.7–2.4)

## 2019-04-09 LAB — COMPREHENSIVE METABOLIC PANEL
ALT: 35 U/L (ref 0–44)
AST: 32 U/L (ref 15–41)
Albumin: 3.5 g/dL (ref 3.5–5.0)
Alkaline Phosphatase: 128 U/L — ABNORMAL HIGH (ref 38–126)
Anion gap: 8 (ref 5–15)
BUN: 17 mg/dL (ref 6–20)
CO2: 28 mmol/L (ref 22–32)
Calcium: 9.4 mg/dL (ref 8.9–10.3)
Chloride: 103 mmol/L (ref 98–111)
Creatinine, Ser: 1.71 mg/dL — ABNORMAL HIGH (ref 0.61–1.24)
GFR calc Af Amer: 51 mL/min — ABNORMAL LOW (ref >60)
GFR calc non Af Amer: 44 mL/min — ABNORMAL LOW (ref >60)
Glucose, Bld: 90 mg/dL (ref 70–99)
Potassium: 4.4 mmol/L (ref 3.5–5.1)
Sodium: 139 mmol/L (ref 135–145)
Total Bilirubin: 1.1 mg/dL (ref 0.3–1.2)
Total Protein: 8.1 g/dL (ref 6.5–8.1)

## 2019-04-09 LAB — LACTATE DEHYDROGENASE: LDH: 233 U/L — ABNORMAL HIGH (ref 98–192)

## 2019-04-09 LAB — PROTIME-INR
INR: 1.9 — ABNORMAL HIGH (ref 0.8–1.2)
Prothrombin Time: 21.3 seconds — ABNORMAL HIGH (ref 11.4–15.2)

## 2019-04-09 LAB — PREALBUMIN: Prealbumin: 31.4 mg/dL (ref 18–38)

## 2019-04-09 NOTE — Patient Instructions (Signed)
1. Decrease Amiodarone 100mg  (1/2 tablet) daily 2. Coumadin dosing per Ander Purpura PharmD 3. Return to [place] clinic in 2 months

## 2019-04-09 NOTE — Progress Notes (Signed)
Patient presents for 1 month follow up in [facility] today with his wife. Reports no problems with VAD equipment or concerns with drive line.    Pt states that he has been doing well since his last visit. He states that he is feeling great and has a lot of energy.    Pt is still in aflutter today. Rate controlled in the 60s. Pt asymptomatic. Pt has been taking Amio 200 mg daily. Will decrease to 100mg  daily per Dr Haroldine Laws.   Vital Signs:  Doppler Pressure: 90 Automatc BP: 121/71 (98) HR: 60s aflutter SPO2: 97% RA  Weight: 177.8 lb w/oeqt Last weight: 177.4 lb Home weights: 168 - 169 lbs  VAD Indication: Destination therapy- CKD  VAD interrogation & Equipment Management: Speed: 5400 Flow: 3.8  Power: 4.1 w    PI: 6.7  Alarms: none Events:  10-20 PI events daily  Fixed speed 5400 Low speed limit: 5100  Primary Controller:  Replace back up battery in 8 months. Back up controller:  Back up battery expired. Replaced today. Replace back up battery in 32 months. SN: [id number]  Manufacture Date: 12/13/2018 Expiration Date: 11/11/2020  Annual Equipment Maintenance on UBC/PM was performed on 05/2018.   I reviewed the LVAD parameters from today and compared the results to the patient's prior recorded data. LVAD interrogation was NEGATIVE for significant power changes, POSITIVE for clinical alarms and STABLE for PI events/speed drops. No programming changes were made and pump is functioning within specified parameters. Pt is performing daily controller and system monitor self tests along with completing weekly and monthly maintenance for LVAD equipment.  LVAD equipment check completed and is in good working order. Back up equipment present.    Exit Site Care: Drive line is being maintained weekly by [name], his wife. Exit site healed and incorporated, the velour is fully implanted at exit site. No redness, drainage or foul odor noted. Pt denies fever or chills. Pt given 8  weekly dressings at this visit.   Significant Events on VAD Support:  none  Device:Medtronic Bi-V Therapies: on Last check: 05/30/2017   BP & Labs:  Doppler BP 90 - Doppler is reflecting Modified systolic.  Hgb 16.7 - No S/S of bleeding. Specifically denies melena/BRBPR or nosebleeds.  LDH stable at with established baseline of 165- 250. Denies tea-colored urine. No power elevations noted on interrogation.    2 year Intermacs follow up completed including:  Quality of Life, KCCQ-12, and Neurocognitive trail making.   Pt completed 1200 feet during 6 minute walk.  Back up controller: 11V backup battery charged during this visit.  Patient Instructions:  1. No medication changes today. 2. Coumadin dosing per Ander Purpura PharmD 3. Return to [place] [facility] in 1 month  [place] Coordinator  Office: [phone]  24/7 Pager: [phone]

## 2019-04-09 NOTE — Progress Notes (Signed)
LVAD Clinic Follow Up Note  HPI:  David [name] is a 53 y.o. male with history of ICD (09 PCI to LAD, mRCA, '10 PCI to Lcx and '12 mRCA), HFrEF (EF ~20% for >5 years), HLD and HTN. S/P LVAD HM-III 04/20/18.   Admitted 04/19/17 through 05/27/2017 for scheduled LVAD implant. On 12/27 he underwent HMIII LVAD implant. Hospital course complicated by PNA with achromobacter tracheal aspirate, shock liver, and AKI. Required short term dialysis.  Readmitted 05/30/17 with symptomatic anemia hgb 3.8.  INR 2.9. He was transfused with 4 units of packed red blood cell. 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  It was suspected AVMs were the likely cause of bleeding in the setting of anticoagulation.  He will need colonoscopy at some point, this can be done as outpatient. Discharged 06/04/17.  Underwent DC-CV for AFL on 10/17/18   Follow up for Heart Failure/LVAD:  Presents for routine f/u with his wife. Previously saw Dr. Caryl Comes for his AFL. DC-CV arranged but [name] cancelled because he said he felt great. Continues to feel really well. Denies any SOB, CP, orthopnea or PND. Can do anything he wants. No fevers, chills or problems with driveline. No bleeding, melena or neuro symptoms. No VAD alarms. Taking all meds as prescribed.    VAD Indication: Destination therapy- CKD  VAD interrogation & Equipment Management: Speed: 5400 Flow: 3.8  Power: 4.1 w PI: 6.7  Alarms: none Events:  10-20 PI events daily  Fixed speed 5400 Low speed limit: 5100  Primary Controller: Replace back up battery in 8 months. Back up controller: Back up battery expired. Replaced today. Replace back up battery in 35months. SN: [id number]  Manufacture Date: 12/13/2018 Expiration Date: 11/11/2020  Annual Equipment Maintenance on UBC/PM was performed on  05/2018. Annual Equipment Maintenance on UBC/PM was performed on 05/2018.  Past Medical History:  Diagnosis Date  . AICD (automatic cardioverter/defibrillator) present   . Anemia   . Anxiety   . CAD (coronary artery disease) 2009   AMI in 12/2007 with PCI to LAD, staged PCI to  mid/distal RCA, NSTEMI in 02/2009 with BMS to LCx  . CHF (congestive heart failure) ([place])   . Chronic kidney disease 11/03/2016   stage 3 kidney disease  . Dysrhythmia    "arrythmia problems at some point", "fatal rhythms"  . History of blood transfusion    "I was bleeding from was rectum" (04/19/2017)  . HLD (hyperlipidemia)   . HTN (hypertension)   . Ischemic cardiomyopathy    Admitted in 07/2010 with CHF exacerbation  . Myocardial infarction ([place])    "I've had 4" (04/19/2017)  . Pneumonia 2018 X 1  . Seizures ([place])    one seizure in 04/2016 during cardiac event (04/19/2017)    Current Outpatient Medications  Medication Sig Dispense Refill  . amiodarone (PACERONE) 200 MG tablet Take 1 tablet (200 mg total) by mouth daily. 90 tablet 3  . atorvastatin (LIPITOR) 40 MG tablet Take 1 tab daily 90 tablet 3  . furosemide (LASIX) 40 MG tablet Take 1 tablet (40 mg total) by mouth daily. KEEP OV. 90 tablet 3  . gabapentin (NEURONTIN) 300 MG capsule Take 1 capsule (300 mg total) by mouth 2 (two) times daily. 60 capsule 6  . losartan (COZAAR) 50 MG tablet Take 1 tablet (50 mg total) by mouth daily. 90 tablet 3  . magnesium  oxide (MAG-OX) 400 MG tablet Take 400 mg by mouth 2 (two) times daily.    . Multiple Vitamins-Minerals (ADULT GUMMY) CHEW Chew 2 tablets by mouth daily.    . pantoprazole (PROTONIX) 40 MG tablet Take 1 tablet (40 mg total) by mouth daily. 210 tablet 0  . potassium chloride SA (KLOR-CON M20) 20 MEQ tablet Take 1 tablet (20 mEq total) by mouth daily. KEEP OV. 90 tablet 3  . traZODone (DESYREL) 50 MG tablet Take 1 tablet (50 mg total) by mouth at bedtime. 30 tablet 5  . warfarin (COUMADIN) 2 MG  tablet Take 2 tablets (4 mg total) by mouth daily. Or as directed (Patient taking differently: Take 2 mg by mouth See admin instructions. 1 mg on Monday and 2 mg AOD) 75 tablet 4  . vitamin C (ASCORBIC ACID) 500 MG tablet Take 500 mg by mouth daily as needed (pt prefrence).     No current facility-administered medications for this encounter.    Plavix [clopidogrel bisulfate]  Review of systems complete and found to be negative unless listed in HPI.    Vital Signs:  Doppler Pressure: 90 Automatc BP: 121/71 (98) HR: 60s aflutter SPO2: 97% RA  Weight: 177.8 lb w/oeqt Last weight: 177.4 lb Home weights: 168 - 169 lbs  General:  NAD.  HEENT: normal  Neck: supple. JVP not elevated.  Carotids 2+ bilat; no bruits. No lymphadenopathy or thryomegaly appreciated. Cor: LVAD hum.  Lungs: Clear. Abdomen:soft, nontender, non-distended. No hepatosplenomegaly. No bruits or masses. Good bowel sounds. Driveline site clean. Anchor in place.  Extremities: no cyanosis, clubbing, rash. Warm no edema  Neuro: alert & oriented x 3. No focal deficits. Moves all 4 without problem    Tele:  AFL 60s Personally reviewed    ASSESSMENT AND PLAN:   1. Chronic systolic CHF with HM III LVAD implant 04/20/17:  - Doing very well with VAD support. - Remains NYHA I - Volume status looks good. Continue lasix 40mg  daily.  - He has been seen at [facility] for transplant referral but has decided not to pursue VAD for now   2. Recurrent AFL with RVR - had DC-CV on 10/17/18. - Remains in AFL. Rate now well controlled on amio 200 dialy. Will decrease to 100 daily - ICD interrogated and found to be in AFL since 10/29/18. Initially with RVR but rate now ell controlled  - Continues to refuse DC-Cv  3. Symptomatic anemia to UGI AVM bleed - s/p APC x 4 lesions in 2/19 - Hgb stable today at  16.7 - No bleeding - Continue PPI - Off ASA due to GIB.   4. H/O of AKI on CKD Stage III:  - Creatinine stable at 1.71 today  (baseline 1.3 - 1.5). Will follow. May need to cut back lasix to qoD  5. VAD management - VAD interrogated personally. Parameters stable. Has some PI events but stable. As above. Consider decreasing lasix - LDH 233 - INR goal 2.0-2.5 Off ASA due to GIB 2/19.  - INR 1.9 this am. ,dbph - MAPs stable 80-90s   6. CAD:  s/p PCI RCA/PLOM with DES x2 and DES to mild LAD 1/18 - No s/s ischemia - Continue statin. Off ASA due to GIB  7. Hypomagnesemia. - Mag 2.0 today. Continue magnesium oxide 400 TID.   Total time spent 35 minutes. Over half that time spent discussing above.   Glori Bickers, MD  11:36 PM

## 2019-04-10 ENCOUNTER — Other Ambulatory Visit (HOSPITAL_COMMUNITY): Payer: Self-pay | Admitting: *Deleted

## 2019-04-10 ENCOUNTER — Other Ambulatory Visit (HOSPITAL_COMMUNITY): Payer: Self-pay | Admitting: Internal Medicine

## 2019-04-10 ENCOUNTER — Other Ambulatory Visit: Payer: Self-pay | Admitting: *Deleted

## 2019-04-10 DIAGNOSIS — G4700 Insomnia, unspecified: Secondary | ICD-10-CM

## 2019-04-10 DIAGNOSIS — G629 Polyneuropathy, unspecified: Secondary | ICD-10-CM

## 2019-04-10 MED ORDER — TRAZODONE HCL 50 MG PO TABS: 50.0000 mg | ORAL_TABLET | Freq: Every day | ORAL | 6 refills | Status: DC

## 2019-04-10 MED ORDER — GABAPENTIN 300 MG PO CAPS: 300.0000 mg | ORAL_CAPSULE | Freq: Two times a day (BID) | ORAL | 6 refills | Status: DC

## 2019-04-15 ENCOUNTER — Ambulatory Visit (HOSPITAL_COMMUNITY): Payer: Self-pay | Admitting: Pharmacist

## 2019-04-15 DIAGNOSIS — Z95811 Presence of heart assist device: Secondary | ICD-10-CM

## 2019-04-15 LAB — POCT INR: INR: 2.7 (ref 2.0–3.0)

## 2019-04-15 NOTE — Progress Notes (Signed)
LVAD INR 

## 2019-04-22 ENCOUNTER — Ambulatory Visit (HOSPITAL_COMMUNITY): Payer: Self-pay | Admitting: Pharmacist

## 2019-04-22 LAB — POCT INR: INR: 1.9 — AB (ref 2.0–3.0)

## 2019-04-22 NOTE — Progress Notes (Signed)
LVAD INR 

## 2019-04-29 ENCOUNTER — Ambulatory Visit (HOSPITAL_COMMUNITY): Payer: Self-pay | Admitting: Pharmacist

## 2019-04-29 DIAGNOSIS — Z95811 Presence of heart assist device: Secondary | ICD-10-CM

## 2019-04-29 LAB — POCT INR: INR: 1.6 — AB (ref 2.0–3.0)

## 2019-04-29 NOTE — Progress Notes (Signed)
LVAD INR 

## 2019-05-01 ENCOUNTER — Ambulatory Visit (INDEPENDENT_AMBULATORY_CARE_PROVIDER_SITE_OTHER): Payer: Medicare HMO | Admitting: *Deleted

## 2019-05-01 DIAGNOSIS — I255 Ischemic cardiomyopathy: Secondary | ICD-10-CM

## 2019-05-02 ENCOUNTER — Other Ambulatory Visit (HOSPITAL_COMMUNITY): Payer: Self-pay | Admitting: Internal Medicine

## 2019-05-02 DIAGNOSIS — I5043 Acute on chronic combined systolic (congestive) and diastolic (congestive) heart failure: Secondary | ICD-10-CM

## 2019-05-02 LAB — CUP PACEART REMOTE DEVICE CHECK
Battery Remaining Longevity: 79 mo
Battery Voltage: 2.98 V
Brady Statistic AP VP Percent: 0 %
Brady Statistic AP VS Percent: 0 %
Brady Statistic AS VP Percent: 2.16 %
Brady Statistic AS VS Percent: 97.84 %
Brady Statistic RA Percent Paced: 0 %
Brady Statistic RV Percent Paced: 3.12 %
Date Time Interrogation Session: 20210106063326
HighPow Impedance: 65 Ohm
Implantable Lead Implant Date: 20180409
Implantable Lead Implant Date: 20180409
Implantable Lead Implant Date: 20180716
Implantable Lead Implant Date: 20180716
Implantable Lead Location: 753858
Implantable Lead Location: 753858
Implantable Lead Location: 753859
Implantable Lead Location: 753860
Implantable Lead Model: 5076
Implantable Lead Model: 511212
Implantable Lead Model: 511212
Implantable Lead Serial Number: 263773
Implantable Lead Serial Number: 263775
Implantable Pulse Generator Implant Date: 20180409
Lead Channel Impedance Value: 304 Ohm
Lead Channel Impedance Value: 342 Ohm
Lead Channel Impedance Value: 3610 Ohm
Lead Channel Impedance Value: 4047 Ohm
Lead Channel Impedance Value: 437 Ohm
Lead Channel Impedance Value: 760 Ohm
Lead Channel Pacing Threshold Amplitude: 0.625 V
Lead Channel Pacing Threshold Amplitude: 0.625 V
Lead Channel Pacing Threshold Amplitude: 1.75 V
Lead Channel Pacing Threshold Pulse Width: 0.4 ms
Lead Channel Pacing Threshold Pulse Width: 0.4 ms
Lead Channel Pacing Threshold Pulse Width: 1 ms
Lead Channel Sensing Intrinsic Amplitude: 12.25 mV
Lead Channel Sensing Intrinsic Amplitude: 12.25 mV
Lead Channel Sensing Intrinsic Amplitude: 2.5 mV
Lead Channel Sensing Intrinsic Amplitude: 2.5 mV
Lead Channel Setting Pacing Amplitude: 2.5 V
Lead Channel Setting Pacing Amplitude: 3.5 V
Lead Channel Setting Pacing Pulse Width: 0.4 ms
Lead Channel Setting Pacing Pulse Width: 1 ms
Lead Channel Setting Sensing Sensitivity: 0.3 mV

## 2019-05-06 ENCOUNTER — Ambulatory Visit (HOSPITAL_COMMUNITY): Payer: Self-pay | Admitting: Pharmacist

## 2019-05-06 DIAGNOSIS — Z95811 Presence of heart assist device: Secondary | ICD-10-CM

## 2019-05-06 LAB — POCT INR: INR: 2.5 (ref 2.0–3.0)

## 2019-05-06 NOTE — Progress Notes (Signed)
LVAD INR 

## 2019-05-13 ENCOUNTER — Ambulatory Visit (HOSPITAL_COMMUNITY): Payer: Self-pay | Admitting: Pharmacist

## 2019-05-13 DIAGNOSIS — Z95811 Presence of heart assist device: Secondary | ICD-10-CM

## 2019-05-13 LAB — POCT INR: INR: 2.4 (ref 2.0–3.0)

## 2019-05-13 NOTE — Progress Notes (Signed)
LVAD INR 

## 2019-05-16 ENCOUNTER — Other Ambulatory Visit (HOSPITAL_COMMUNITY): Payer: Self-pay | Admitting: Internal Medicine

## 2019-05-16 DIAGNOSIS — G4700 Insomnia, unspecified: Secondary | ICD-10-CM

## 2019-05-16 DIAGNOSIS — G629 Polyneuropathy, unspecified: Secondary | ICD-10-CM

## 2019-05-20 ENCOUNTER — Ambulatory Visit (HOSPITAL_COMMUNITY): Payer: Self-pay | Admitting: Pharmacist

## 2019-05-20 ENCOUNTER — Telehealth: Payer: Self-pay | Admitting: Physician Assistant

## 2019-05-20 ENCOUNTER — Other Ambulatory Visit: Payer: Self-pay

## 2019-05-20 ENCOUNTER — Encounter: Payer: Self-pay | Admitting: *Deleted

## 2019-05-20 ENCOUNTER — Telehealth: Payer: Self-pay | Admitting: Student

## 2019-05-20 ENCOUNTER — Ambulatory Visit (INDEPENDENT_AMBULATORY_CARE_PROVIDER_SITE_OTHER): Payer: Medicare HMO | Admitting: *Deleted

## 2019-05-20 ENCOUNTER — Telehealth (HOSPITAL_COMMUNITY): Payer: Self-pay | Admitting: *Deleted

## 2019-05-20 DIAGNOSIS — I5022 Chronic systolic (congestive) heart failure: Secondary | ICD-10-CM | POA: Diagnosis not present

## 2019-05-20 DIAGNOSIS — Z95810 Presence of automatic (implantable) cardiac defibrillator: Secondary | ICD-10-CM | POA: Diagnosis not present

## 2019-05-20 DIAGNOSIS — Z95811 Presence of heart assist device: Secondary | ICD-10-CM

## 2019-05-20 DIAGNOSIS — I472 Ventricular tachycardia, unspecified: Secondary | ICD-10-CM

## 2019-05-20 LAB — CUP PACEART INCLINIC DEVICE CHECK
Battery Remaining Longevity: 79 mo
Battery Voltage: 2.94 V
Brady Statistic AP VP Percent: 0 %
Brady Statistic AP VS Percent: 0 %
Brady Statistic AS VP Percent: 1.65 %
Brady Statistic AS VS Percent: 98.35 %
Brady Statistic RA Percent Paced: 0 %
Brady Statistic RV Percent Paced: 2.57 %
Date Time Interrogation Session: 20210125181149
HighPow Impedance: 74 Ohm
Implantable Lead Implant Date: 20180409
Implantable Lead Implant Date: 20180409
Implantable Lead Implant Date: 20180716
Implantable Lead Implant Date: 20180716
Implantable Lead Location: 753858
Implantable Lead Location: 753858
Implantable Lead Location: 753859
Implantable Lead Location: 753860
Implantable Lead Model: 5076
Implantable Lead Model: 511212
Implantable Lead Model: 511212
Implantable Lead Serial Number: 263773
Implantable Lead Serial Number: 263775
Implantable Pulse Generator Implant Date: 20180409
Lead Channel Impedance Value: 304 Ohm
Lead Channel Impedance Value: 342 Ohm
Lead Channel Impedance Value: 3648 Ohm
Lead Channel Impedance Value: 4047 Ohm
Lead Channel Impedance Value: 437 Ohm
Lead Channel Impedance Value: 836 Ohm
Lead Channel Pacing Threshold Amplitude: 1 V
Lead Channel Pacing Threshold Pulse Width: 0.4 ms
Lead Channel Sensing Intrinsic Amplitude: 15.25 mV
Lead Channel Sensing Intrinsic Amplitude: 3.125 mV
Lead Channel Setting Pacing Amplitude: 2.5 V
Lead Channel Setting Pacing Pulse Width: 0.4 ms
Lead Channel Setting Sensing Sensitivity: 0.6 mV

## 2019-05-20 LAB — POCT INR: INR: 2.5 (ref 2.0–3.0)

## 2019-05-20 NOTE — Telephone Encounter (Signed)
Spoke with patient's wife (DPR). Advised of findings. Explained ICD has not delivered any therapies, alert is for sensing issues. Offered DC appointment today at 3:00pm. Pt's wife accepted appointment at 15:00. Advised of visitor restrictions, pt will need to come alone to appointment. Pt's wife verbalizes understanding. Aware of direct DC phone number if she has any questions or concerns.

## 2019-05-20 NOTE — Progress Notes (Signed)
CRT-D device check in office, added-on for alert for RV lead integrity warning. LVAD in place. 2 High Rate-NS episodes noted (EGMs show oversensing) and SIC 148. RV threshold and sensing consistent with previous measurements; also tested sensing integrated bipolar--R-waves measure 15.50mV. Unable to reproduce oversensing/noise with isometrics with both bipolar and integrated bipolar sensing. Pt not aware of any external sources of EMI. Per recommendation from [employer] rep, reduced RV sensitivity to 0.103mV. RA and RV lead impedances stable over time. LV lead impedance >3000ohms (known issue), no capture today at 3.5V @ 1.76ms, LV lead turned off per previous recommendation from Dr. Caryl Comes to preserve battery (VP 2.6%). Persistent A-flutter since 10/2018, on warfarin, programmed VVI, pt previously declined DCCV as he feels well. Device programmed with appropriate safety margins. Heart failure diagnostics reviewed and trends are stable for patient. Estimated longevity 6.6 years. Patient enrolled in remote follow up. Patient education completed including shock plan. Plan for manual ICD transmission on 05/23/19 to reassess for any episodes after sensitivity reprogramming. Will review with Dr. Caryl Comes on 05/20/19 for any additional recommendations. Pt and wife in agreement with plan, aware to call for any recurrent auditory alerts.

## 2019-05-20 NOTE — Telephone Encounter (Signed)
Recieved a call from [employer] about high vent rate episodes. The lead is over-sensing. No shocks.  Will send message to Dr Lovena Le (EP hospital today) and Dr Haroldine Laws.  Rosaria Ferries, PA-C 05/20/2019 7:13 AM Beeper [phone]

## 2019-05-20 NOTE — Telephone Encounter (Signed)
  Rhonda Barrett, PA-C received call over night from [employer] about high vent rate episodes. The lead is over-sensing. No shocks.  Message forward to Dr. Haroldine Laws and Dr. Caryl Comes  Can we please look at his report and give him a call? May need programming adjustments.  Thank yall!

## 2019-05-20 NOTE — Telephone Encounter (Signed)
Carelink alert transmission from 05/20/19 at 06:38 reviewed. Presenting rhythm A-flutter/VS 98-117bpm w/intermittent oversensing. SIC 139. 2 high rate-NS episodes from 05/13/19 and 05/19/19 reviewed--both show oversensing. RV lead programmed bipolar. Last auto threshold 0.75V @ 0.69ms as of 05/20/19. Known likely LV lead fracture (impedance >3000ohms), unable to assess RV/LV thresholds at 01/16/19 OV due to A-flutter w/RVR. Will offer DC appointment for troubleshooting and reprogramming.

## 2019-05-20 NOTE — Telephone Encounter (Signed)
The pt wife called back stating the pt was alarming again. I explained to her the alarm will go off every 6 hours. I also told her to keep the 3 pm appointment per device nurse Raquel Sarna.

## 2019-05-20 NOTE — Telephone Encounter (Signed)
Patient's wife called VAD pager this morning to report pt called her after she left the house and told her his ICD "had gone off two times" since she left.   David [name] turned around and was walking in the door to check on David [name]. He was awake, said he felt fine, but confirmed his device had "gone off twice".   In reviewing events, discovered patient had not received shocks, but heard his device "sing to him" twice. Explained this is an electronic alert that will need to be reviewed by [facility]. They confirmed he could send transmission to [facility] from home.  [facility] and confirmed he was not shocked; suspect lead issue. The patient is coming to device clinic this afternoon for interrogation.  Called wife and she confirmed this was the plan. No further issues for VAD clinic at this time.  Zada Girt RN, VAD Coordinator [phone] 24/7 VAD pager

## 2019-05-20 NOTE — Telephone Encounter (Signed)
Duplicate-opened in error. 

## 2019-05-20 NOTE — Progress Notes (Signed)
LVAD INR 

## 2019-05-20 NOTE — Telephone Encounter (Signed)
See encounter from 05/20/19.

## 2019-05-21 NOTE — Telephone Encounter (Signed)
Folks, Thanks for all the work yesterday.  The main issue, it seems is to avoid, esp in the presence of the LVAD, shocks.  If we continue to have SIC events, or false detection, I would favor turning of the ICD

## 2019-05-23 NOTE — Telephone Encounter (Signed)
Transmission from 05/23/19 reviewed. No additional episodes of oversensing since ICD reprogramming on 05/20/19 (RV sensitivity reduced to 0.109mV at that time). SIC 66 since 05/21/19. RV sensing, threshold, and impedance trends remain stable. Routed to Dr. Caryl Comes and Dr. Haroldine Laws as Juluis Rainier.  Spoke with patient's wife, [name] ([place]). Advised of stable findings. Advised will call back if Dr. Caryl Comes or Dr. Haroldine Laws have any new recommendations. Otherwise, will plan to keep current f/u schedule. Next remote transmission scheduled for 07/31/19. [name] in agreement with plan, denies questions at this time.

## 2019-05-27 ENCOUNTER — Ambulatory Visit (HOSPITAL_COMMUNITY): Payer: Self-pay | Admitting: Pharmacist

## 2019-05-27 DIAGNOSIS — Z95811 Presence of heart assist device: Secondary | ICD-10-CM

## 2019-05-27 LAB — POCT INR: INR: 2.8 (ref 2.0–3.0)

## 2019-05-27 NOTE — Progress Notes (Signed)
LVAD INR 

## 2019-05-29 ENCOUNTER — Other Ambulatory Visit (HOSPITAL_COMMUNITY): Payer: Self-pay | Admitting: Internal Medicine

## 2019-05-29 DIAGNOSIS — Z95811 Presence of heart assist device: Secondary | ICD-10-CM

## 2019-06-03 ENCOUNTER — Ambulatory Visit (HOSPITAL_COMMUNITY): Payer: Self-pay | Admitting: Pharmacist

## 2019-06-03 DIAGNOSIS — Z95811 Presence of heart assist device: Secondary | ICD-10-CM

## 2019-06-03 LAB — POCT INR: INR: 1.7 — AB (ref 2.0–3.0)

## 2019-06-03 NOTE — Progress Notes (Signed)
LVAD INR 

## 2019-06-10 ENCOUNTER — Ambulatory Visit (HOSPITAL_COMMUNITY): Payer: Self-pay | Admitting: Pharmacist

## 2019-06-10 DIAGNOSIS — Z95811 Presence of heart assist device: Secondary | ICD-10-CM

## 2019-06-10 LAB — POCT INR: INR: 2.1 (ref 2.0–3.0)

## 2019-06-10 NOTE — Progress Notes (Signed)
LVAD INR 

## 2019-06-14 ENCOUNTER — Other Ambulatory Visit (HOSPITAL_COMMUNITY): Payer: Self-pay | Admitting: Unknown Physician Specialty

## 2019-06-14 DIAGNOSIS — Z95811 Presence of heart assist device: Secondary | ICD-10-CM

## 2019-06-14 DIAGNOSIS — Z7901 Long term (current) use of anticoagulants: Secondary | ICD-10-CM

## 2019-06-17 ENCOUNTER — Ambulatory Visit (HOSPITAL_COMMUNITY): Payer: Self-pay | Admitting: Pharmacist

## 2019-06-17 ENCOUNTER — Encounter (HOSPITAL_COMMUNITY): Payer: Medicare HMO

## 2019-06-17 DIAGNOSIS — Z95811 Presence of heart assist device: Secondary | ICD-10-CM

## 2019-06-17 LAB — POCT INR: INR: 2 (ref 2.0–3.0)

## 2019-06-17 NOTE — Progress Notes (Signed)
LVAD INR 

## 2019-06-21 ENCOUNTER — Other Ambulatory Visit (HOSPITAL_COMMUNITY): Payer: Self-pay | Admitting: *Deleted

## 2019-06-21 DIAGNOSIS — Z7901 Long term (current) use of anticoagulants: Secondary | ICD-10-CM

## 2019-06-21 DIAGNOSIS — Z79899 Other long term (current) drug therapy: Secondary | ICD-10-CM

## 2019-06-21 DIAGNOSIS — Z95811 Presence of heart assist device: Secondary | ICD-10-CM

## 2019-06-24 ENCOUNTER — Ambulatory Visit (HOSPITAL_COMMUNITY)
Admission: RE | Admit: 2019-06-24 | Discharge: 2019-06-24 | Disposition: A | Discharge status: Home / Self Care | Payer: Medicare HMO | Source: Ambulatory Visit | Attending: Internal Medicine | Admitting: Internal Medicine

## 2019-06-24 ENCOUNTER — Other Ambulatory Visit (HOSPITAL_COMMUNITY): Payer: Self-pay | Admitting: *Deleted

## 2019-06-24 ENCOUNTER — Ambulatory Visit (HOSPITAL_COMMUNITY): Payer: Self-pay | Admitting: Pharmacist

## 2019-06-24 ENCOUNTER — Ambulatory Visit (HOSPITAL_COMMUNITY)
Admission: RE | Admit: 2019-06-24 | Discharge: 2019-06-24 | Disposition: A | Discharge status: Home / Self Care | Payer: Medicare HMO | Source: Ambulatory Visit | Attending: Cardiology | Admitting: Cardiology

## 2019-06-24 ENCOUNTER — Encounter (HOSPITAL_COMMUNITY): Payer: Self-pay

## 2019-06-24 ENCOUNTER — Other Ambulatory Visit: Payer: Self-pay

## 2019-06-24 DIAGNOSIS — F419 Anxiety disorder, unspecified: Secondary | ICD-10-CM | POA: Insufficient documentation

## 2019-06-24 DIAGNOSIS — Z95811 Presence of heart assist device: Secondary | ICD-10-CM

## 2019-06-24 DIAGNOSIS — I5022 Chronic systolic (congestive) heart failure: Secondary | ICD-10-CM | POA: Insufficient documentation

## 2019-06-24 DIAGNOSIS — I2510 Atherosclerotic heart disease of native coronary artery without angina pectoris: Secondary | ICD-10-CM | POA: Insufficient documentation

## 2019-06-24 DIAGNOSIS — I130 Hypertensive heart and chronic kidney disease with heart failure and stage 1 through stage 4 chronic kidney disease, or unspecified chronic kidney disease: Secondary | ICD-10-CM | POA: Diagnosis not present

## 2019-06-24 DIAGNOSIS — Z79899 Other long term (current) drug therapy: Secondary | ICD-10-CM | POA: Diagnosis not present

## 2019-06-24 DIAGNOSIS — I255 Ischemic cardiomyopathy: Secondary | ICD-10-CM | POA: Diagnosis not present

## 2019-06-24 DIAGNOSIS — N179 Acute kidney failure, unspecified: Secondary | ICD-10-CM | POA: Insufficient documentation

## 2019-06-24 DIAGNOSIS — Z95810 Presence of automatic (implantable) cardiac defibrillator: Secondary | ICD-10-CM | POA: Diagnosis not present

## 2019-06-24 DIAGNOSIS — N183 Chronic kidney disease, stage 3 unspecified: Secondary | ICD-10-CM | POA: Diagnosis not present

## 2019-06-24 DIAGNOSIS — Z992 Dependence on renal dialysis: Secondary | ICD-10-CM | POA: Insufficient documentation

## 2019-06-24 DIAGNOSIS — G4700 Insomnia, unspecified: Secondary | ICD-10-CM

## 2019-06-24 DIAGNOSIS — G629 Polyneuropathy, unspecified: Secondary | ICD-10-CM | POA: Diagnosis not present

## 2019-06-24 DIAGNOSIS — E785 Hyperlipidemia, unspecified: Secondary | ICD-10-CM | POA: Insufficient documentation

## 2019-06-24 DIAGNOSIS — Z7901 Long term (current) use of anticoagulants: Secondary | ICD-10-CM | POA: Diagnosis not present

## 2019-06-24 DIAGNOSIS — I252 Old myocardial infarction: Secondary | ICD-10-CM | POA: Diagnosis not present

## 2019-06-24 DIAGNOSIS — I4892 Unspecified atrial flutter: Secondary | ICD-10-CM

## 2019-06-24 DIAGNOSIS — I5043 Acute on chronic combined systolic (congestive) and diastolic (congestive) heart failure: Secondary | ICD-10-CM

## 2019-06-24 DIAGNOSIS — I10 Essential (primary) hypertension: Secondary | ICD-10-CM

## 2019-06-24 DIAGNOSIS — Z955 Presence of coronary angioplasty implant and graft: Secondary | ICD-10-CM | POA: Insufficient documentation

## 2019-06-24 DIAGNOSIS — D649 Anemia, unspecified: Secondary | ICD-10-CM | POA: Diagnosis not present

## 2019-06-24 DIAGNOSIS — I5020 Unspecified systolic (congestive) heart failure: Secondary | ICD-10-CM

## 2019-06-24 LAB — CBC
HCT: 54.4 % — ABNORMAL HIGH (ref 39.0–52.0)
Hemoglobin: 16.8 g/dL (ref 13.0–17.0)
MCH: 26.3 pg (ref 26.0–34.0)
MCHC: 30.9 g/dL (ref 30.0–36.0)
MCV: 85.1 fL (ref 80.0–100.0)
Platelets: 175 10*3/uL (ref 150–400)
RBC: 6.39 MIL/uL — ABNORMAL HIGH (ref 4.22–5.81)
RDW: 15.6 % — ABNORMAL HIGH (ref 11.5–15.5)
WBC: 9.1 10*3/uL (ref 4.0–10.5)
nRBC: 0 % (ref 0.0–0.2)

## 2019-06-24 LAB — BASIC METABOLIC PANEL
Anion gap: 9 (ref 5–15)
BUN: 13 mg/dL (ref 6–20)
CO2: 27 mmol/L (ref 22–32)
Calcium: 9 mg/dL (ref 8.9–10.3)
Chloride: 102 mmol/L (ref 98–111)
Creatinine, Ser: 1.61 mg/dL — ABNORMAL HIGH (ref 0.61–1.24)
GFR calc Af Amer: 55 mL/min — ABNORMAL LOW (ref >60)
GFR calc non Af Amer: 47 mL/min — ABNORMAL LOW (ref >60)
Glucose, Bld: 90 mg/dL (ref 70–99)
Potassium: 4 mmol/L (ref 3.5–5.1)
Sodium: 138 mmol/L (ref 135–145)

## 2019-06-24 LAB — PROTIME-INR
INR: 1.5 — ABNORMAL HIGH (ref 0.8–1.2)
Prothrombin Time: 18 seconds — ABNORMAL HIGH (ref 11.4–15.2)

## 2019-06-24 LAB — TSH: TSH: 2.496 u[IU]/mL (ref 0.350–4.500)

## 2019-06-24 LAB — LACTATE DEHYDROGENASE: LDH: 248 U/L — ABNORMAL HIGH (ref 98–192)

## 2019-06-24 MED ORDER — MAGNESIUM OXIDE 400 MG PO TABS: 400.0000 mg | ORAL_TABLET | Freq: Two times a day (BID) | ORAL | 6 refills | Status: DC

## 2019-06-24 MED ORDER — POTASSIUM CHLORIDE CRYS ER 20 MEQ PO TBCR: 20.0000 meq | EXTENDED_RELEASE_TABLET | Freq: Every day | ORAL | 3 refills | Status: DC

## 2019-06-24 MED ORDER — AMIODARONE HCL 200 MG PO TABS: 100.0000 mg | ORAL_TABLET | Freq: Every day | ORAL | 3 refills | Status: DC

## 2019-06-24 MED ORDER — ATORVASTATIN CALCIUM 40 MG PO TABS: ORAL_TABLET | ORAL | 3 refills | Status: DC

## 2019-06-24 MED ORDER — GABAPENTIN 300 MG PO CAPS: 300.0000 mg | ORAL_CAPSULE | Freq: Two times a day (BID) | ORAL | 6 refills | Status: DC

## 2019-06-24 MED ORDER — FUROSEMIDE 40 MG PO TABS: ORAL_TABLET | ORAL | 6 refills | Status: DC

## 2019-06-24 MED ORDER — PANTOPRAZOLE SODIUM 40 MG PO TBEC: 40.0000 mg | DELAYED_RELEASE_TABLET | Freq: Every day | ORAL | 3 refills | Status: DC

## 2019-06-24 MED ORDER — LOSARTAN POTASSIUM 50 MG PO TABS: 50.0000 mg | ORAL_TABLET | Freq: Every day | ORAL | 3 refills | Status: DC

## 2019-06-24 MED ORDER — TRAZODONE HCL 50 MG PO TABS: 50.0000 mg | ORAL_TABLET | Freq: Every day | ORAL | 6 refills | Status: DC

## 2019-06-24 MED ORDER — WARFARIN SODIUM 2 MG PO TABS: ORAL_TABLET | ORAL | 4 refills | Status: DC

## 2019-06-24 MED ORDER — ENOXAPARIN SODIUM 40 MG/0.4ML ~~LOC~~ SOLN: 40.0000 mg | Freq: Two times a day (BID) | SUBCUTANEOUS | 0 refills | Status: DC

## 2019-06-24 NOTE — Progress Notes (Addendum)
Patient presents for 2 month follow up in [facility] today with his wife. Reports no problems with VAD equipment or concerns with drive line.    Pt states that he has been doing well since his last visit. He states that he is feeling great and has a lot of energy.    Pt is in NSR today. Marcia [employer] rep in clinic to turn off patient's LV lead per Dr Caryl Comes. Per interrogation, LV lead is already off. Pt has been taking Amio 100 mg daily; will continue per Dr Haroldine Laws.   Chest xray completed today. Reviewed by Dr Haroldine Laws. No active cardiopulmonary disease.  Vital Signs:  Doppler Pressure: 90 Automatc BP: 105/63 (90) HR: 81 SR  SPO2: 95% RA  Weight: 175 lb w/o eqt Last weight: 177.8 lb Home weights: 168 - 169 lbs  VAD Indication: Destination therapy- CKD  VAD interrogation & Equipment Management: Speed: 5400 Flow: 4.4 Power: 4.1 w    PI: 4.5  Alarms: none Events:  20-40 PI events daily  Fixed speed 5400 Low speed limit: 5100  Primary Controller:  Back up battery expired. Replaced today. Replace back up battery in 29 months. SN: [id number] Manufacture date: 12/13/18  Expiration date: 11/11/2020 Back up controller:  Replace back up battery in 29 months.  Annual Equipment Maintenance on UBC/PM was performed on 05/2018.   I reviewed the LVAD parameters from today and compared the results to the patient's prior recorded data. LVAD interrogation was NEGATIVE for significant power changes, negative for clinical alarms and STABLE for PI events/speed drops. No programming changes were made and pump is functioning within specified parameters. Pt is performing daily controller and system monitor self tests along with completing weekly and monthly maintenance for LVAD equipment.  LVAD equipment check completed and is in good working order. Back up equipment present.    Exit Site Care: Drive line is being maintained weekly by [name], his wife. Exit site healed and  incorporated, the velour is fully implanted at exit site. No redness, drainage or foul odor noted. Pt denies fever or chills. Pt given 8 weekly dressings at this visit.   Significant Events on VAD Support:  none  Device:[employer] Bi-V Therapies: on Last check: 05/30/2017   BP & Labs:  Doppler BP 90 - Doppler is reflecting Modified systolic.  Hgb 16.8 - No S/S of bleeding. Specifically denies melena/BRBPR or nosebleeds.  LDH stable at 248with established baseline of 165- 250. Denies tea-colored urine. No power elevations noted on interrogation.     Patient Instructions:  1. No medication changes today 2. Coumadin dosing per Ander Purpura PharmD. Coumadin 4mg  (2 tablets) today and tomorrow. Then resume 2 mg daily.  Additionally we will add Lovenox injection 1 syringe twice a day for 2 days. The package of syringes will come with 10 shots- use only 4 syringes.  3. Return to [place] clinic in 2 months   Emerson Monte RN [place] Coordinator  Office: [phone]  24/7 Pager: [phone]

## 2019-06-24 NOTE — Patient Instructions (Signed)
1. No medication changes today 2. Coumadin dosing per Ander Purpura PharmD. Coumadin 4mg  (2 tablets) today and tomorrow. Then resume 2 mg daily.  Additionally we will add Lovenox injection 1 syringe twice a day for 2 days. The package of syringes will come with 10 shots- use only 4 syringes.  3. Return to [place] [facility] in 2 months

## 2019-06-24 NOTE — Progress Notes (Signed)
LVAD INR 

## 2019-06-24 NOTE — Progress Notes (Signed)
LVAD Clinic Follow Up Note  HPI:  David [name] is a 54 y.o. male with history of ICD (09 PCI to LAD, mRCA, '10 PCI to Lcx and '12 mRCA), HFrEF (EF ~20% for >5 years), HLD and HTN. S/P LVAD HM-III 04/20/18.   Admitted 04/19/17 through 05/27/2017 for scheduled LVAD implant. On 12/27 he underwent HMIII LVAD implant. Hospital course complicated by PNA with achromobacter tracheal aspirate, shock liver, and AKI. Required short term dialysis.  Readmitted 05/30/17 with symptomatic anemia hgb 3.8.  INR 2.9. He was transfused with 4 units of packed red blood cell. 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  It was suspected AVMs were the likely cause of bleeding in the setting of anticoagulation.  He will need colonoscopy at some point, this can be done as outpatient. Discharged 06/04/17.  Underwent DC-CV for AFL on 10/17/18   Follow up for Heart Failure/LVAD:  Presents for routine f/u with his wife. Previously saw Dr. Caryl Comes for his AFL. DC-CV arranged but Mr. Hamza cancelled because he said he felt great. Continues to feel well. Active without any problems. Found to be in SNR today. Device interrogation shows conversion from AFL to NSR several days ago. Denies orthopnea or PND. No fevers, chills or problems with driveline. No bleeding, melena or neuro symptoms. No VAD alarms. Taking all meds as prescribed.    VAD Indication: Destination therapy- CKD  VAD interrogation & Equipment Management: Speed: 5400 Flow: 4.4 Power: 4.1 w PI: 4.5  Alarms: none Events:  20-40 PI events daily  Fixed speed 5400 Low speed limit: 5100    Past Medical History:  Diagnosis Date  . AICD (automatic cardioverter/defibrillator) present   . Anemia   . Anxiety   . CAD (coronary artery disease) 2009   AMI in 12/2007 with PCI to LAD, staged PCI to   mid/distal RCA, NSTEMI in 02/2009 with BMS to LCx  . CHF (congestive heart failure) ([place])   . Chronic kidney disease 11/03/2016   stage 3 kidney disease  . Dysrhythmia    "arrythmia problems at some point", "fatal rhythms"  . History of blood transfusion    "I was bleeding from was rectum" (04/19/2017)  . HLD (hyperlipidemia)   . HTN (hypertension)   . Ischemic cardiomyopathy    Admitted in 07/2010 with CHF exacerbation  . Myocardial infarction ([place])    "I've had 4" (04/19/2017)  . Pneumonia 2018 X 1  . Seizures ([place])    one seizure in 04/2016 during cardiac event (04/19/2017)    Current Outpatient Medications  Medication Sig Dispense Refill  . amiodarone (PACERONE) 200 MG tablet Take 0.5 tablets (100 mg total) by mouth daily. 90 tablet 3  . atorvastatin (LIPITOR) 40 MG tablet Take 1 tab daily 90 tablet 3  . furosemide (LASIX) 40 MG tablet TAKE 1 TABLET BY MOUTH ONCE DAILY. 90 tablet 6  . gabapentin (NEURONTIN) 300 MG capsule Take 1 capsule (300 mg total) by mouth 2 (two) times daily. 60 capsule 6  . losartan (COZAAR) 50 MG tablet Take 1 tablet (50 mg total) by mouth daily. 90 tablet 3  . magnesium oxide (MAG-OX) 400 MG tablet Take 1 tablet (400 mg total) by mouth 2 (two) times daily. 60 tablet 6  . Multiple Vitamins-Minerals (ADULT GUMMY) CHEW Chew 2 tablets by mouth daily.    . pantoprazole (PROTONIX) 40 MG tablet Take 1  tablet (40 mg total) by mouth daily. 90 tablet 3  . potassium chloride SA (KLOR-CON M20) 20 MEQ tablet Take 1 tablet (20 mEq total) by mouth daily. KEEP OV. 90 tablet 3  . traZODone (DESYREL) 50 MG tablet Take 1 tablet (50 mg total) by mouth at bedtime. 30 tablet 6  . vitamin C (ASCORBIC ACID) 500 MG tablet Take 500 mg by mouth daily as needed (pt prefrence).    . warfarin (COUMADIN) 2 MG tablet TAKE 2 TABLETS BY MOUTH ONCE DAILY OR AS DIRECTED 75 tablet 4  . enoxaparin (LOVENOX) 40 MG/0.4ML injection Inject 0.4 mLs (40 mg total) into the skin every 12 (twelve)  hours. 4 mL 0   No current facility-administered medications for this encounter.    Plavix [clopidogrel bisulfate]  Review of systems complete and found to be negative unless listed in HPI.    Vital Signs:  Doppler Pressure: 90 Automatc BP: 105/63 (90) HR: 81 SR  SPO2: 95% RA  Weight: 175 lb w/o eqt Last weight: 177.8 lb Home weights: 168 - 169 lbs  General:  NAD.  HEENT: normal  Neck: supple. JVP not elevated.  Carotids 2+ bilat; no bruits. No lymphadenopathy or thryomegaly appreciated. Cor: LVAD hum.  Lungs: Clear. Abdomen:  soft, nontender, non-distended. No hepatosplenomegaly. No bruits or masses. Good bowel sounds. Driveline site clean. Anchor in place.  Extremities: no cyanosis, clubbing, rash. Warm no edema  Neuro: alert & oriented x 3. No focal deficits. Moves all 4 without problem   Tele:  NSR 60s Personally reviewed     ASSESSMENT AND PLAN:   1. Chronic systolic CHF with HM III LVAD implant 04/20/17:  - Continues to do very well with VAD support. - NYHA I - Volume status looks good. Continue alsix 40 daily. Can cut back as needed. - He has been seen at [facility] for transplant referral but has decided not to pursue VAD for now   2. Recurrent AFL with RVR - had DC-CV on 10/17/18. - He has been in AFL for some time (since 10/29/18). Now on amio 100 daily.  - However he is in NSR today. Device interrogation shows converted to NSR several days ago.    3. Symptomatic anemia to UGI AVM bleed - s/p APC x 4 lesions in 2/19 - Hgb stable today at  16.8 - No bleeding - Continue PPI - Off ASA due to GIB.   4. H/O of AKI on CKD Stage III:  - Creatinine stable at 1.6 today (baseline 1.3 - 1.5). Will follow. May need to cut back lasix to every other day  5. VAD management - VAD interrogated personally. Parameters stable. - LDH 248 - INR goal 2.0-2.5 Off ASA due to GIB 2/19.  - INR 1.5 will give 1/2 dose lovenox for 2 days. Discussed dosing with PharmD personally. -  MAPs stable 80-90s   6. CAD:  s/p PCI RCA/PLOM with DES x2 and DES to mild LAD 1/18 - No s/s ischemia - Continue statin. Off ASA due to GIB  7. Hypomagnesemia. - Continue magnesium oxide 400 TID.   Total time spent 35 minutes. Over half that time spent discussing above.   Glori Bickers, MD  1:45 PM

## 2019-06-25 LAB — T4: T4, Total: 11 ug/dL (ref 4.5–12.0)

## 2019-06-26 ENCOUNTER — Encounter (HOSPITAL_COMMUNITY): Payer: Self-pay | Admitting: *Deleted

## 2019-06-26 ENCOUNTER — Other Ambulatory Visit (HOSPITAL_COMMUNITY): Payer: Self-pay | Admitting: *Deleted

## 2019-07-01 ENCOUNTER — Ambulatory Visit (HOSPITAL_COMMUNITY): Payer: Self-pay | Admitting: Pharmacist

## 2019-07-01 DIAGNOSIS — Z95811 Presence of heart assist device: Secondary | ICD-10-CM

## 2019-07-01 LAB — POCT INR: INR: 2.2 (ref 2.0–3.0)

## 2019-07-01 NOTE — Progress Notes (Signed)
LVAD INR 

## 2019-07-08 ENCOUNTER — Ambulatory Visit (HOSPITAL_COMMUNITY): Payer: Self-pay | Admitting: Pharmacist

## 2019-07-08 DIAGNOSIS — Z95811 Presence of heart assist device: Secondary | ICD-10-CM

## 2019-07-08 LAB — POCT INR: INR: 1.6 — AB (ref 2.0–3.0)

## 2019-07-08 NOTE — Progress Notes (Signed)
LVAD INR 

## 2019-07-15 ENCOUNTER — Ambulatory Visit (HOSPITAL_COMMUNITY): Payer: Self-pay | Admitting: Pharmacist

## 2019-07-15 DIAGNOSIS — Z95811 Presence of heart assist device: Secondary | ICD-10-CM

## 2019-07-15 LAB — POCT INR: INR: 2 (ref 2.0–3.0)

## 2019-07-15 NOTE — Progress Notes (Signed)
LVAD INR 

## 2019-07-22 ENCOUNTER — Ambulatory Visit (HOSPITAL_COMMUNITY): Payer: Self-pay | Admitting: Pharmacist

## 2019-07-22 DIAGNOSIS — Z95811 Presence of heart assist device: Secondary | ICD-10-CM

## 2019-07-22 LAB — POCT INR: INR: 1.9 — AB (ref 2.0–3.0)

## 2019-07-22 NOTE — Progress Notes (Signed)
LVAD INR 

## 2019-07-29 ENCOUNTER — Ambulatory Visit (HOSPITAL_COMMUNITY): Payer: Self-pay | Admitting: Pharmacist

## 2019-07-29 DIAGNOSIS — Z95811 Presence of heart assist device: Secondary | ICD-10-CM

## 2019-07-29 LAB — POCT INR: INR: 1.7 — AB (ref 2.0–3.0)

## 2019-07-29 NOTE — Progress Notes (Signed)
LVAD INR 

## 2019-07-31 ENCOUNTER — Ambulatory Visit (INDEPENDENT_AMBULATORY_CARE_PROVIDER_SITE_OTHER): Payer: Medicare HMO | Admitting: *Deleted

## 2019-07-31 DIAGNOSIS — I472 Ventricular tachycardia, unspecified: Secondary | ICD-10-CM

## 2019-07-31 LAB — CUP PACEART REMOTE DEVICE CHECK
Battery Remaining Longevity: 72 mo
Battery Voltage: 2.98 V
Brady Statistic AP VP Percent: 0 %
Brady Statistic AP VS Percent: 0 %
Brady Statistic AS VP Percent: 0.02 %
Brady Statistic AS VS Percent: 99.98 %
Brady Statistic RA Percent Paced: 0 %
Brady Statistic RV Percent Paced: 0.02 %
Date Time Interrogation Session: 20210407072505
HighPow Impedance: 73 Ohm
Implantable Lead Implant Date: 20180409
Implantable Lead Implant Date: 20180409
Implantable Lead Implant Date: 20180716
Implantable Lead Implant Date: 20180716
Implantable Lead Location: 753858
Implantable Lead Location: 753858
Implantable Lead Location: 753859
Implantable Lead Location: 753860
Implantable Lead Model: 5076
Implantable Lead Model: 511212
Implantable Lead Model: 511212
Implantable Lead Serial Number: 263773
Implantable Lead Serial Number: 263775
Implantable Pulse Generator Implant Date: 20180409
Lead Channel Impedance Value: 266 Ohm
Lead Channel Impedance Value: 380 Ohm
Lead Channel Impedance Value: 3914 Ohm
Lead Channel Impedance Value: 399 Ohm
Lead Channel Impedance Value: 4047 Ohm
Lead Channel Impedance Value: 779 Ohm
Lead Channel Pacing Threshold Amplitude: 0.625 V
Lead Channel Pacing Threshold Amplitude: 1 V
Lead Channel Pacing Threshold Amplitude: 1.75 V
Lead Channel Pacing Threshold Pulse Width: 0.4 ms
Lead Channel Pacing Threshold Pulse Width: 0.4 ms
Lead Channel Pacing Threshold Pulse Width: 1 ms
Lead Channel Sensing Intrinsic Amplitude: 14.5 mV
Lead Channel Sensing Intrinsic Amplitude: 14.5 mV
Lead Channel Sensing Intrinsic Amplitude: 3.75 mV
Lead Channel Sensing Intrinsic Amplitude: 3.75 mV
Lead Channel Setting Pacing Amplitude: 2.5 V
Lead Channel Setting Pacing Pulse Width: 0.4 ms
Lead Channel Setting Sensing Sensitivity: 0.6 mV

## 2019-07-31 NOTE — Progress Notes (Signed)
ICD Remote  

## 2019-08-01 IMAGING — CT CT CHEST W/O CM
2 of 4 series · 13 of 36 positions shown, 16 images · non-contrast
Comparison: CT scan of April 29, 2016.

CLINICAL DATA: Heart failure.

EXAM:
CT CHEST, ABDOMEN AND PELVIS WITHOUT CONTRAST
TECHNIQUE: Multidetector CT imaging of the chest, abdomen and pelvis was
performed following the standard protocol without IV contrast.

[Series 3: cap wo 5.0 i31f 2 · axial · 0.86mm/px · z∈[+617,+1177]mm · 10 of 136 slices shown, 13 images]
[im 12/136  mediastinal]
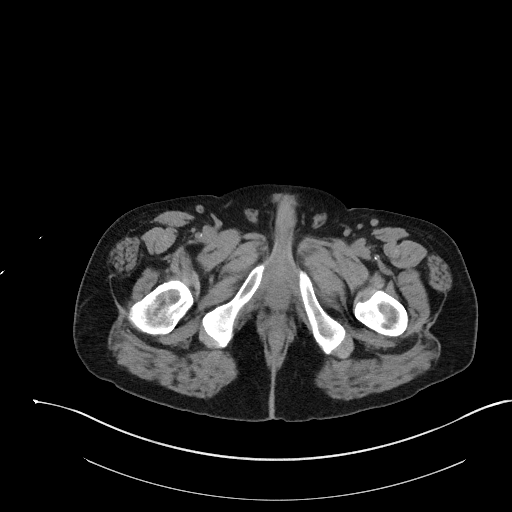
[im 12/136  lung]
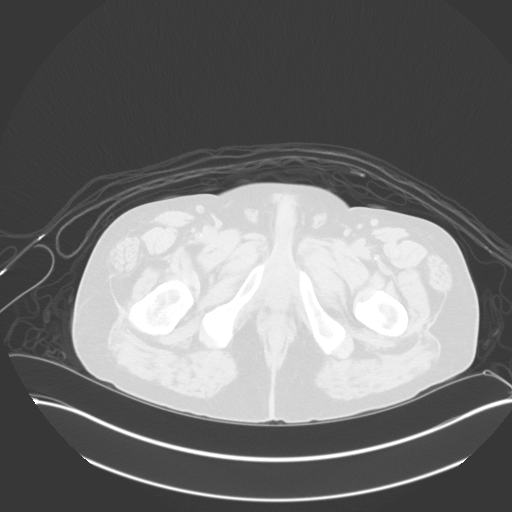
[im 23/136  lung]
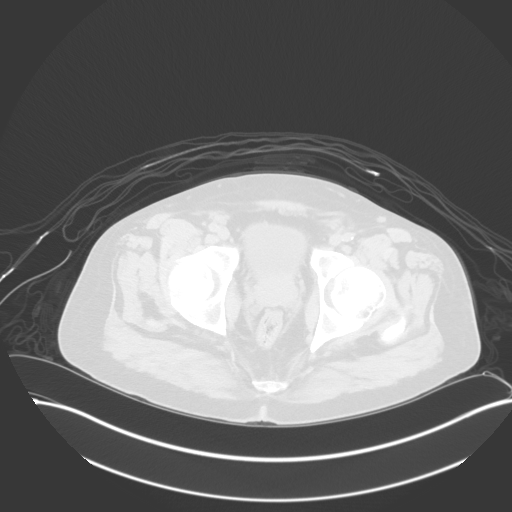
[im 34/136  lung]
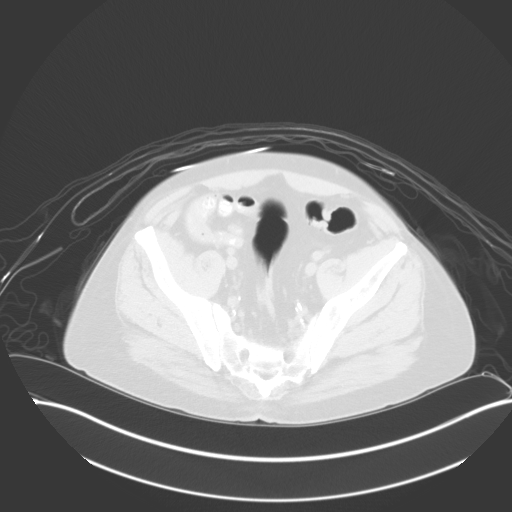
[im 46/136  lung]
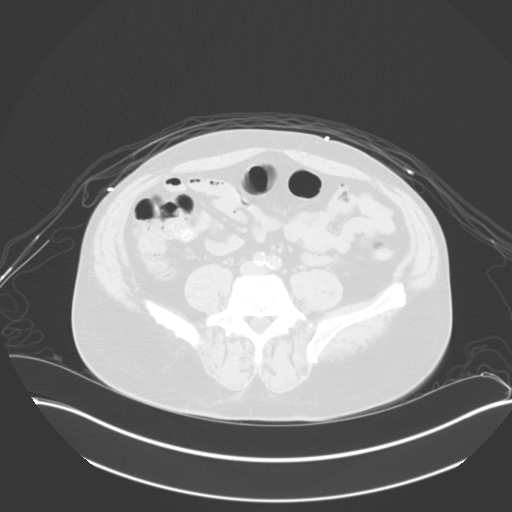
[im 57/136  mediastinal]
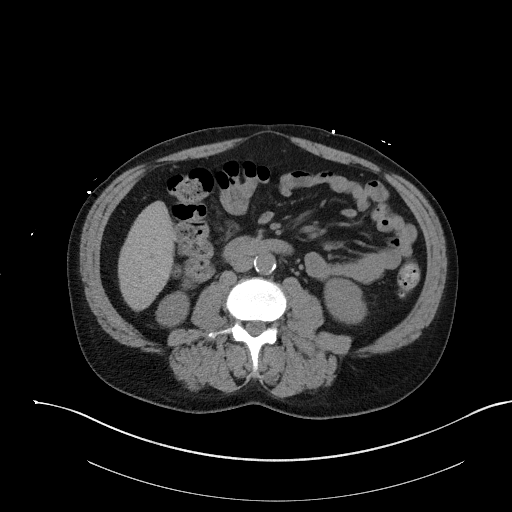
[im 57/136  lung]
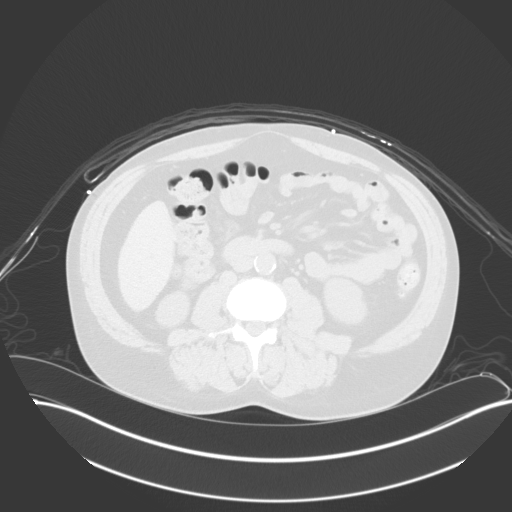
[im 79/136  lung]
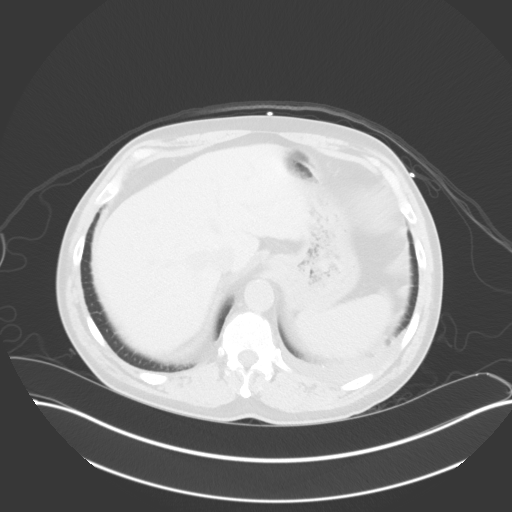
[im 91/136  lung]
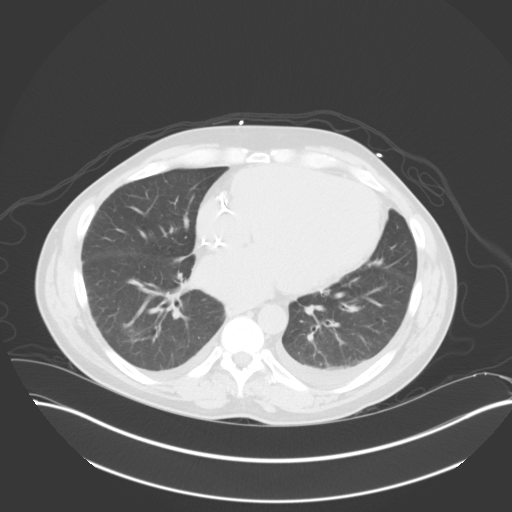
[im 102/136  lung]
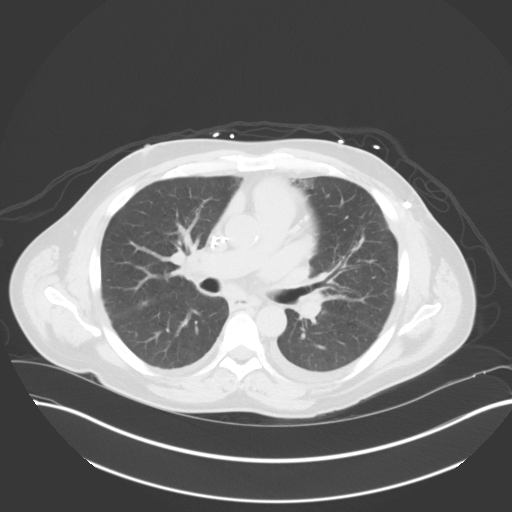
[im 113/136  mediastinal]
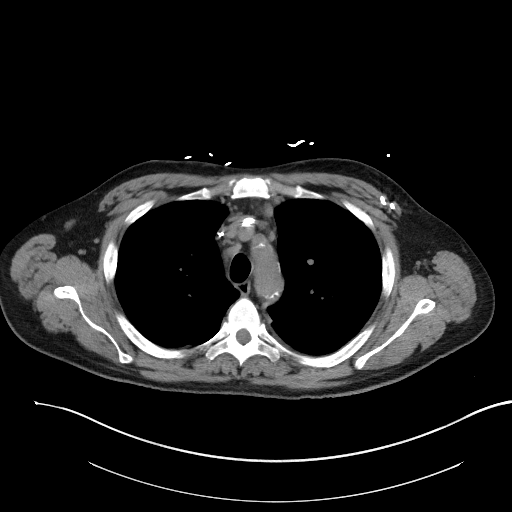
[im 113/136  lung]
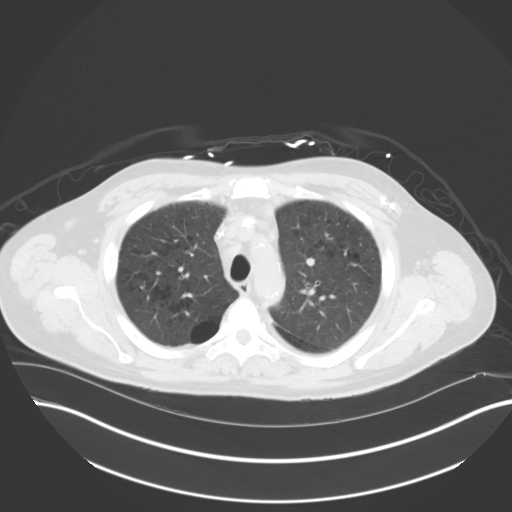
[im 124/136  lung]
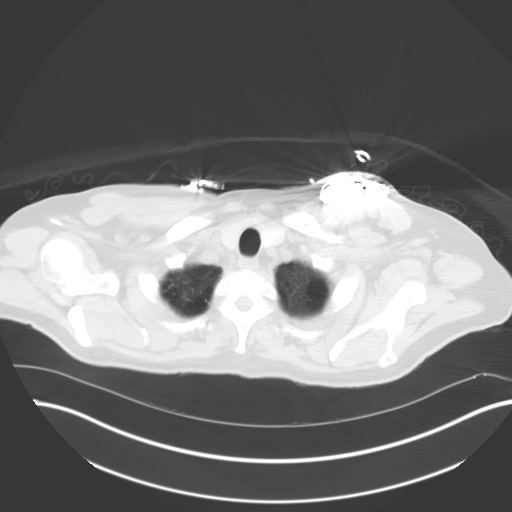

[Series 6: coronal · coronal · 0.81mm/px · 3 of 147 slices shown]
[im 30/147  lung]
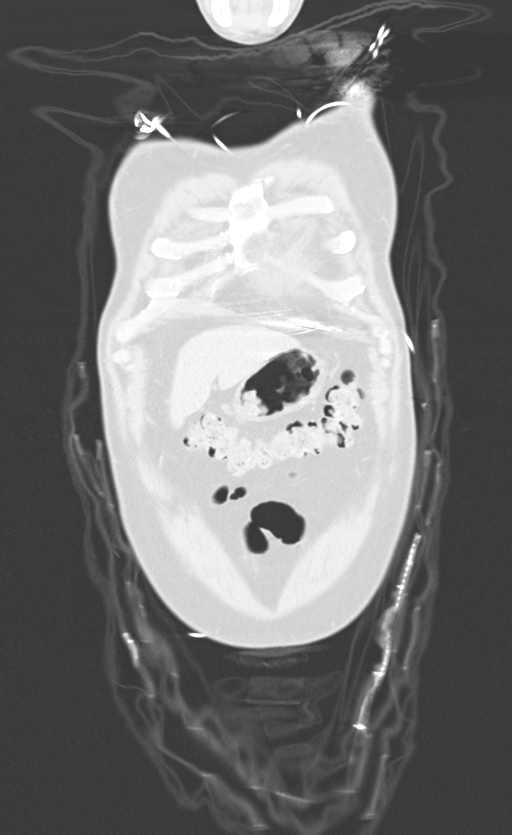
[im 59/147  lung]
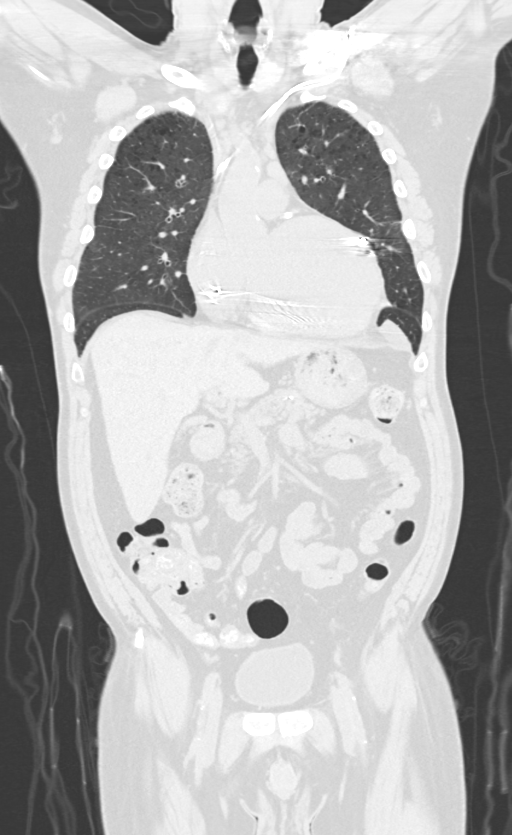
[im 88/147  lung]
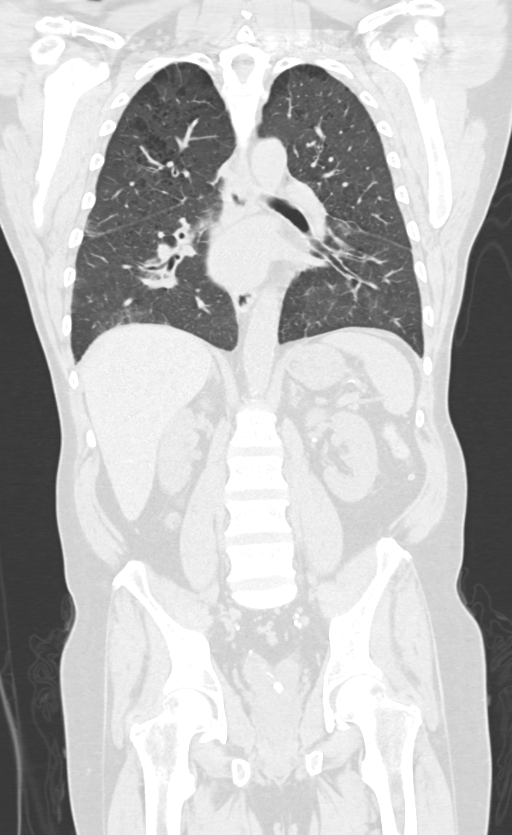

[13 of 36 positions shown; findings below may reference images not displayed]

FINDINGS: CT CHEST FINDINGS

Cardiovascular: Atherosclerosis of thoracic aorta is noted without
aneurysm formation. Mild cardiomegaly is noted. No pericardial
effusion is noted. Left-sided pacemaker is in grossly good position.
Right-sided PICC line is noted with distal tip at cavoatrial
junction. Coronary artery stents are noted.

Mediastinum/Nodes: 2 cm right peritracheal lymph node is noted which
is not significantly changed. Stable mildly enlarged anterior
mediastinal lymph nodes are noted. Thyroid gland, trachea, and
esophagus demonstrate no significant findings.

Lungs/Pleura: No pneumothorax is noted. Mild emphysematous disease
is noted in the upper lobes bilaterally. Mild left pleural effusion
is noted with adjacent subsegmental atelectasis. Stable 4 mm nodule
is noted in right upper lobe best seen on image number 63 of series
5.

Musculoskeletal: No chest wall mass or suspicious bone lesions
identified.

CT ABDOMEN PELVIS FINDINGS

Hepatobiliary: No focal liver abnormality is seen. No gallstones,
gallbladder wall thickening, or biliary dilatation.

Pancreas: Unremarkable. No pancreatic ductal dilatation or
surrounding inflammatory changes.

Spleen: Normal in size without focal abnormality.

Adrenals/Urinary Tract: Adrenal glands are unremarkable. Kidneys are
normal, without renal calculi, focal lesion, or hydronephrosis.
Bladder is unremarkable.

Stomach/Bowel: Stomach is within normal limits. Appendix appears
normal. No evidence of bowel wall thickening, distention, or
inflammatory changes.

Vascular/Lymphatic: Aortic atherosclerosis. No enlarged abdominal or
pelvic lymph nodes.

Reproductive: Prostate is unremarkable.

Other: No abdominal wall hernia or abnormality. No abdominopelvic
ascites.

Musculoskeletal: No acute or significant osseous findings.
IMPRESSION: Mild cardiomegaly.

Atherosclerosis of thoracic and abdominal aorta is noted without
aneurysm formation.

Grossly stable mediastinal adenopathy is noted of indeterminate
etiology.

Stable 4 mm nodule noted in right upper lobe.

Mild emphysematous disease is noted in both upper lobes.

No acute abnormality seen in the abdomen or pelvis.

## 2019-08-01 IMAGING — DX DG ORTHOPANTOGRAM /PANORAMIC
1 series · 1 of 1 positions shown · non-contrast
Comparison: None.

CLINICAL DATA: Heart failure.  LVAD candidate.

EXAM:
ORTHOPANTOGRAM/PANORAMIC

[view not recorded]
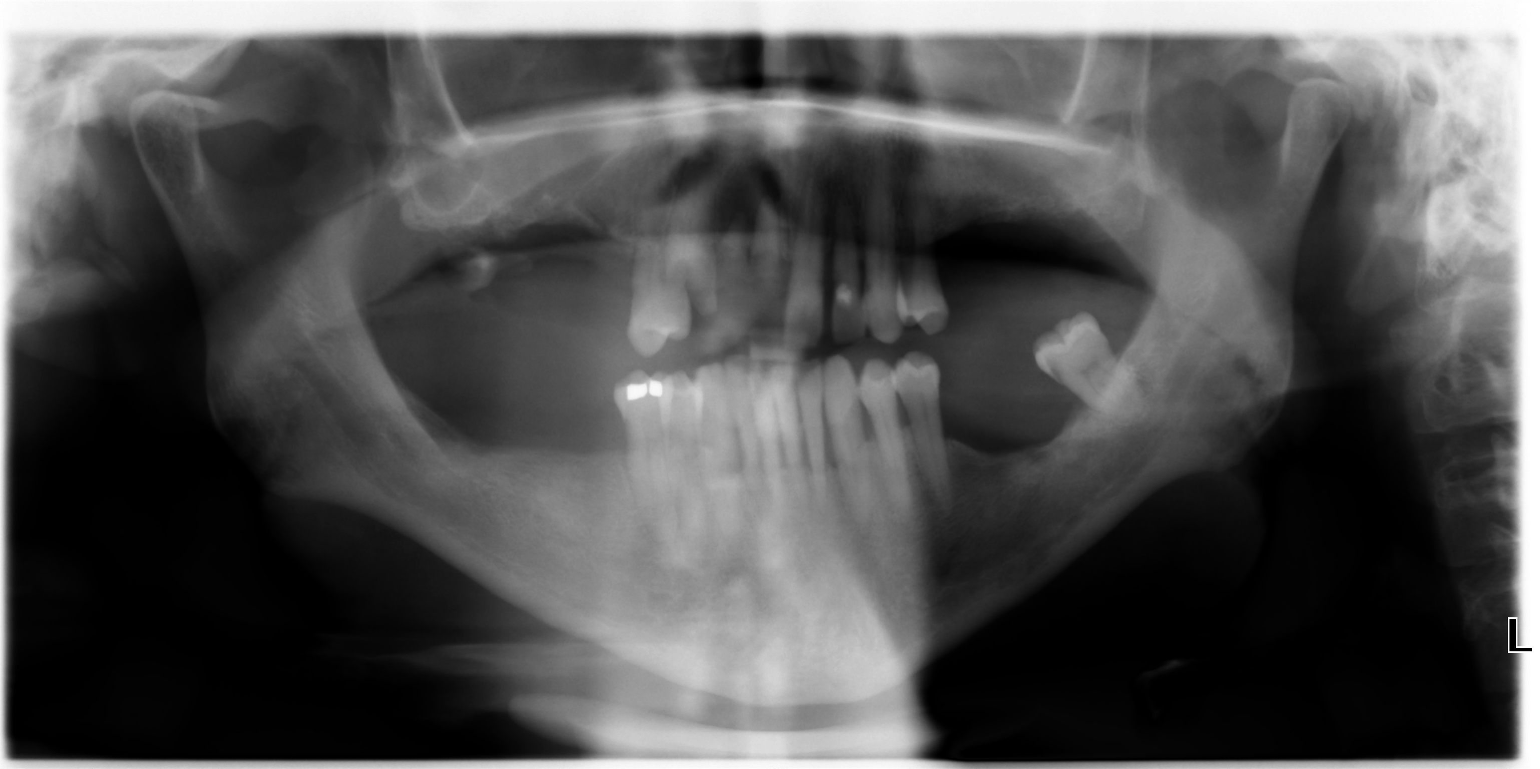

[1 of 1 positions shown; findings below may reference images not displayed]

FINDINGS: Patient is missing multiple teeth. Existing lower teeth show no
evidence for apical lucency to suggest abscess. Upper teeth are not
well demonstrated due to motion artifact although there may be a
cavity in the second most right upper tooth.
IMPRESSION: 1. No evidence for periapical abscess involving the teeth of the
mandible.
2. Upper teeth not well demonstrated although cavity of the second
most right upper tooth suspected.

## 2019-08-05 ENCOUNTER — Ambulatory Visit (HOSPITAL_COMMUNITY): Payer: Self-pay | Admitting: Pharmacist

## 2019-08-05 DIAGNOSIS — Z95811 Presence of heart assist device: Secondary | ICD-10-CM

## 2019-08-05 LAB — POCT INR: INR: 2 (ref 2.0–3.0)

## 2019-08-05 NOTE — Progress Notes (Signed)
LVAD INR 

## 2019-08-12 ENCOUNTER — Ambulatory Visit (HOSPITAL_COMMUNITY): Payer: Self-pay | Admitting: Pharmacist

## 2019-08-12 DIAGNOSIS — Z95811 Presence of heart assist device: Secondary | ICD-10-CM

## 2019-08-12 LAB — POCT INR: INR: 2 (ref 2.0–3.0)

## 2019-08-12 NOTE — Progress Notes (Signed)
LVAD INR 

## 2019-08-19 ENCOUNTER — Ambulatory Visit (HOSPITAL_COMMUNITY): Payer: Self-pay | Admitting: Pharmacist

## 2019-08-19 DIAGNOSIS — Z95811 Presence of heart assist device: Secondary | ICD-10-CM

## 2019-08-19 LAB — POCT INR: INR: 1.8 — AB (ref 2.0–3.0)

## 2019-08-19 MED ORDER — WARFARIN SODIUM 2 MG PO TABS: ORAL_TABLET | ORAL | 11 refills | Status: DC

## 2019-08-19 NOTE — Progress Notes (Signed)
LVAD INR 

## 2019-08-23 ENCOUNTER — Other Ambulatory Visit (HOSPITAL_COMMUNITY): Payer: Self-pay | Admitting: Unknown Physician Specialty

## 2019-08-23 DIAGNOSIS — Z95811 Presence of heart assist device: Secondary | ICD-10-CM

## 2019-08-23 DIAGNOSIS — Z7901 Long term (current) use of anticoagulants: Secondary | ICD-10-CM

## 2019-08-26 ENCOUNTER — Ambulatory Visit (HOSPITAL_COMMUNITY): Payer: Self-pay | Admitting: Pharmacist

## 2019-08-26 ENCOUNTER — Encounter (HOSPITAL_COMMUNITY): Payer: Medicare HMO

## 2019-08-26 DIAGNOSIS — Z95811 Presence of heart assist device: Secondary | ICD-10-CM

## 2019-08-26 LAB — POCT INR: INR: 1.8 — AB (ref 2.0–3.0)

## 2019-08-26 NOTE — Progress Notes (Signed)
LVAD INR 

## 2019-09-02 ENCOUNTER — Ambulatory Visit (HOSPITAL_COMMUNITY): Payer: Self-pay | Admitting: Pharmacist

## 2019-09-02 DIAGNOSIS — Z95811 Presence of heart assist device: Secondary | ICD-10-CM

## 2019-09-02 LAB — POCT INR: INR: 1.9 — AB (ref 2.0–3.0)

## 2019-09-02 NOTE — Progress Notes (Signed)
LVAD INR 

## 2019-09-05 ENCOUNTER — Ambulatory Visit (HOSPITAL_COMMUNITY)
Admission: RE | Admit: 2019-09-05 | Discharge: 2019-09-05 | Disposition: A | Discharge status: Home / Self Care | Payer: Medicare HMO | Source: Ambulatory Visit | Attending: Cardiology | Admitting: Cardiology

## 2019-09-05 ENCOUNTER — Other Ambulatory Visit: Payer: Self-pay

## 2019-09-05 ENCOUNTER — Ambulatory Visit (HOSPITAL_COMMUNITY): Payer: Self-pay | Admitting: Pharmacist

## 2019-09-05 VITALS — BP 95/62 | HR 76 | Wt 180.2 lb

## 2019-09-05 DIAGNOSIS — Z955 Presence of coronary angioplasty implant and graft: Secondary | ICD-10-CM | POA: Insufficient documentation

## 2019-09-05 DIAGNOSIS — Z95811 Presence of heart assist device: Secondary | ICD-10-CM | POA: Diagnosis not present

## 2019-09-05 DIAGNOSIS — Z9889 Other specified postprocedural states: Secondary | ICD-10-CM | POA: Insufficient documentation

## 2019-09-05 DIAGNOSIS — I5022 Chronic systolic (congestive) heart failure: Secondary | ICD-10-CM

## 2019-09-05 DIAGNOSIS — I130 Hypertensive heart and chronic kidney disease with heart failure and stage 1 through stage 4 chronic kidney disease, or unspecified chronic kidney disease: Secondary | ICD-10-CM | POA: Insufficient documentation

## 2019-09-05 DIAGNOSIS — D649 Anemia, unspecified: Secondary | ICD-10-CM | POA: Insufficient documentation

## 2019-09-05 DIAGNOSIS — I252 Old myocardial infarction: Secondary | ICD-10-CM | POA: Insufficient documentation

## 2019-09-05 DIAGNOSIS — N183 Chronic kidney disease, stage 3 unspecified: Secondary | ICD-10-CM | POA: Insufficient documentation

## 2019-09-05 DIAGNOSIS — I255 Ischemic cardiomyopathy: Secondary | ICD-10-CM | POA: Diagnosis not present

## 2019-09-05 DIAGNOSIS — Z8701 Personal history of pneumonia (recurrent): Secondary | ICD-10-CM | POA: Insufficient documentation

## 2019-09-05 DIAGNOSIS — Z7901 Long term (current) use of anticoagulants: Secondary | ICD-10-CM | POA: Diagnosis not present

## 2019-09-05 DIAGNOSIS — F419 Anxiety disorder, unspecified: Secondary | ICD-10-CM | POA: Diagnosis not present

## 2019-09-05 DIAGNOSIS — E785 Hyperlipidemia, unspecified: Secondary | ICD-10-CM | POA: Insufficient documentation

## 2019-09-05 DIAGNOSIS — Z79899 Other long term (current) drug therapy: Secondary | ICD-10-CM | POA: Diagnosis not present

## 2019-09-05 DIAGNOSIS — Z95810 Presence of automatic (implantable) cardiac defibrillator: Secondary | ICD-10-CM | POA: Diagnosis not present

## 2019-09-05 DIAGNOSIS — I10 Essential (primary) hypertension: Secondary | ICD-10-CM

## 2019-09-05 DIAGNOSIS — I480 Paroxysmal atrial fibrillation: Secondary | ICD-10-CM

## 2019-09-05 DIAGNOSIS — I2510 Atherosclerotic heart disease of native coronary artery without angina pectoris: Secondary | ICD-10-CM | POA: Diagnosis not present

## 2019-09-05 LAB — COMPREHENSIVE METABOLIC PANEL
ALT: 22 U/L (ref 0–44)
AST: 27 U/L (ref 15–41)
Albumin: 3.4 g/dL — ABNORMAL LOW (ref 3.5–5.0)
Alkaline Phosphatase: 95 U/L (ref 38–126)
Anion gap: 13 (ref 5–15)
BUN: 15 mg/dL (ref 6–20)
CO2: 24 mmol/L (ref 22–32)
Calcium: 9.3 mg/dL (ref 8.9–10.3)
Chloride: 102 mmol/L (ref 98–111)
Creatinine, Ser: 1.65 mg/dL — ABNORMAL HIGH (ref 0.61–1.24)
GFR calc Af Amer: 53 mL/min — ABNORMAL LOW (ref >60)
GFR calc non Af Amer: 46 mL/min — ABNORMAL LOW (ref >60)
Glucose, Bld: 105 mg/dL — ABNORMAL HIGH (ref 70–99)
Potassium: 3.8 mmol/L (ref 3.5–5.1)
Sodium: 139 mmol/L (ref 135–145)
Total Bilirubin: 1 mg/dL (ref 0.3–1.2)
Total Protein: 7.8 g/dL (ref 6.5–8.1)

## 2019-09-05 LAB — LACTATE DEHYDROGENASE: LDH: 199 U/L — ABNORMAL HIGH (ref 98–192)

## 2019-09-05 LAB — CBC
HCT: 51.6 % (ref 39.0–52.0)
Hemoglobin: 16 g/dL (ref 13.0–17.0)
MCH: 26 pg (ref 26.0–34.0)
MCHC: 31 g/dL (ref 30.0–36.0)
MCV: 83.9 fL (ref 80.0–100.0)
Platelets: 166 10*3/uL (ref 150–400)
RBC: 6.15 MIL/uL — ABNORMAL HIGH (ref 4.22–5.81)
RDW: 14.6 % (ref 11.5–15.5)
WBC: 8.3 10*3/uL (ref 4.0–10.5)
nRBC: 0 % (ref 0.0–0.2)

## 2019-09-05 LAB — PROTIME-INR
INR: 1.4 — ABNORMAL HIGH (ref 0.8–1.2)
Prothrombin Time: 16.8 seconds — ABNORMAL HIGH (ref 11.4–15.2)

## 2019-09-05 NOTE — Patient Instructions (Addendum)
1. No change in medications 2. Return to clinic in 2 months

## 2019-09-05 NOTE — Progress Notes (Signed)
Patient presents for 2 month follow up in [facility] today with his wife. Reports no problems with VAD equipment or concerns with drive line.    Pt states that he has been doing well since his last visit. He states that he is feeling great and has a lot of energy. And has been doing some fishing.    Pt is in NSR today. Last transmission pt was in AF for 79 hrs. Pt has been taking Amio 100 mg daily; will continue per Dr Haroldine Laws.   Vital Signs:  Doppler Pressure: 84 Automatc BP: 95/62 (72) HR: 76 SR  SPO2: 96% RA  Weight: 180.2 lb w/o eqt Last weight: 175 lb Home weights: 168 - 169 lbs  VAD Indication: Destination therapy- CKD  VAD interrogation & Equipment Management: Speed: 5400 Flow: 4.9 Power: 4.1 w    PI: 3.2  Alarms: none Events:  10-20 PI events daily  Fixed speed 5400 Low speed limit: 5100  Primary Controller:  Replace back up battery in 27 months.  Back up controller:  Replace back up battery in 27 months.  Annual Equipment Maintenance on UBC/PM was performed on 05/2018.   I reviewed the LVAD parameters from today and compared the results to the patient's prior recorded data. LVAD interrogation was NEGATIVE for significant power changes, negative for clinical alarms and STABLE for PI events/speed drops. No programming changes were made and pump is functioning within specified parameters. Pt is performing daily controller and system monitor self tests along with completing weekly and monthly maintenance for LVAD equipment.  LVAD equipment check completed and is in good working order. Back up equipment present.    Exit Site Care: Drive line is being maintained weekly by [name], his wife. Exit site healed and incorporated, the velour is fully implanted at exit site. No redness, drainage or foul odor noted. Pt denies fever or chills. Pt given 8 weekly dressings at this visit.   Significant Events on VAD Support:  none  Device:Medtronic Bi-V Therapies:  on Last check: 05/30/2017   BP & Labs:  Doppler BP 84 - Doppler is reflecting Modified systolic.  Hgb 16 - No S/S of bleeding. Specifically denies melena/BRBPR or nosebleeds.  LDH stable at 199with established baseline of 165- 250. Denies tea-colored urine. No power elevations noted on interrogation.     Patient Instructions:  1. No change in medications 2. Return to clinic in 2 months  Tanda Rockers RN [place] Coordinator  Office: [phone]  24/7 Pager: [phone]

## 2019-09-05 NOTE — Progress Notes (Signed)
LVAD INR 

## 2019-09-07 NOTE — Progress Notes (Signed)
LVAD Clinic Follow Up Note  HPI:  David [name] is a 54 y.o. male with history of ICD (09 PCI to LAD, mRCA, '10 PCI to Lcx and '12 mRCA), HFrEF (EF ~20% for >5 years), HLD and HTN. S/P LVAD HM-III 04/20/18.   Admitted 04/19/17 through 05/27/2017 for scheduled LVAD implant. On 12/27 he underwent HMIII LVAD implant. Hospital course complicated by PNA with achromobacter tracheal aspirate, shock liver, and AKI. Required short term dialysis.  Readmitted 05/30/17 with symptomatic anemia hgb 3.8.  INR 2.9. He was transfused with 4 units of packed red blood cell. 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  It was suspected AVMs were the likely cause of bleeding in the setting of anticoagulation.  He will need colonoscopy at some point, this can be done as outpatient. Discharged 06/04/17.  Underwent DC-CV for AFL on 10/17/18   Follow up for Heart Failure/LVAD:  Presents for routine f/u with his wife. Says he feels great. Very active. No problems with ADLs. Has been out fishing. No edema. Denies orthopnea or PND. No fevers, chills or problems with driveline. No bleeding, melena or neuro symptoms. No VAD alarms. Taking all meds as prescribed.   ICD interrogated in clinic personally. Currently NSR. Has only had 79 hrs of AF since last transmission 2 months ago. No VT/VF.   VAD Indication: Destination therapy- CKD  VAD interrogation & Equipment Management: Speed: 5400 Flow: 4.9 Power: 4.1 w PI: 3.2  Alarms: none Events:  10-20 PI events daily  Fixed speed 5400 Low speed limit: 5100  Primary Controller:  Replace back up battery in 27 months.  Back up controller: Replace back up battery in 44months.  Annual Equipment Maintenance on UBC/PM was performed on 05/2018.   Past Medical History:  Diagnosis Date  . AICD (automatic  cardioverter/defibrillator) present   . Anemia   . Anxiety   . CAD (coronary artery disease) 2009   AMI in 12/2007 with PCI to LAD, staged PCI to  mid/distal RCA, NSTEMI in 02/2009 with BMS to LCx  . CHF (congestive heart failure) ([place])   . Chronic kidney disease 11/03/2016   stage 3 kidney disease  . Dysrhythmia    "arrythmia problems at some point", "fatal rhythms"  . History of blood transfusion    "I was bleeding from was rectum" (04/19/2017)  . HLD (hyperlipidemia)   . HTN (hypertension)   . Ischemic cardiomyopathy    Admitted in 07/2010 with CHF exacerbation  . Myocardial infarction ([place])    "I've had 4" (04/19/2017)  . Pneumonia 2018 X 1  . Seizures ([place])    one seizure in 04/2016 during cardiac event (04/19/2017)    Current Outpatient Medications  Medication Sig Dispense Refill  . amiodarone (PACERONE) 200 MG tablet Take 0.5 tablets (100 mg total) by mouth daily. 90 tablet 3  . atorvastatin (LIPITOR) 40 MG tablet Take 1 tab daily 90 tablet 3  . furosemide (LASIX) 40 MG tablet TAKE 1 TABLET BY MOUTH ONCE DAILY. 90 tablet 6  . gabapentin (NEURONTIN) 300 MG capsule Take 1 capsule (300 mg total) by mouth 2 (two) times daily. 60 capsule 6  . losartan (COZAAR) 50 MG tablet Take 1 tablet (50 mg total) by mouth daily. 90 tablet 3  . magnesium oxide (MAG-OX) 400 MG tablet Take 1 tablet (400 mg total) by mouth 2 (two) times daily. 60 tablet  6  . Multiple Vitamins-Minerals (ADULT GUMMY) CHEW Chew 2 tablets by mouth daily.    . pantoprazole (PROTONIX) 40 MG tablet Take 1 tablet (40 mg total) by mouth daily. 90 tablet 3  . potassium chloride SA (KLOR-CON M20) 20 MEQ tablet Take 1 tablet (20 mEq total) by mouth daily. KEEP OV. 90 tablet 3  . traZODone (DESYREL) 50 MG tablet Take 1 tablet (50 mg total) by mouth at bedtime. 30 tablet 6  . warfarin (COUMADIN) 2 MG tablet Take 2 mg (1 tablet) daily or as directed by HF clinic 60 tablet 11  . enoxaparin (LOVENOX) 40 MG/0.4ML injection Inject  0.4 mLs (40 mg total) into the skin every 12 (twelve) hours. (Patient not taking: Reported on 09/05/2019) 4 mL 0  . vitamin C (ASCORBIC ACID) 500 MG tablet Take 500 mg by mouth daily as needed (pt prefrence).     No current facility-administered medications for this encounter.    Plavix [clopidogrel bisulfate]  Review of systems complete and found to be negative unless listed in HPI.    Vital Signs:  Doppler Pressure: 84 Automatc BP: 95/62 (72) HR: 76 SR  SPO2: 96% RA  Weight: 180.2 lb w/o eqt Last weight: 175 lb Home weights: 168 - 169 lbs  Physical Exam: General:  NAD.  HEENT: normal  Neck: supple. JVP not elevated.  Carotids 2+ bilat; no bruits. No lymphadenopathy or thryomegaly appreciated. Cor: LVAD hum.  Lungs: Clear. Abdomen:  soft, nontender, non-distended. No hepatosplenomegaly. No bruits or masses. Good bowel sounds. Driveline site clean. Anchor in place.  Extremities: no cyanosis, clubbing, rash. Warm no edema  Neuro: alert & oriented x 3. No focal deficits. Moves all 4 without problem    ASSESSMENT AND PLAN:   1. Chronic systolic CHF with HM III LVAD implant 04/20/17:  - Continues to do very well with VAD support. - Stable NYHAI  - Volume status looks good. Continue lasix 40 daily Can cut back as needed. - He has been seen at [facility] for transplant referral but has decided not to pursue VAD for now. No change  2. Recurrent AFL with RVR - had DC-CV on 10/17/18. - He has been in AFL for some time (since 10/29/18). Now on amio 100 daily.  - However he is in NSR today.  - ICD interrogated in clinic personally. Currently NSR. Has only had 79 hrs of AF since last transmission 2 months ago. No VT/VF.   3. Symptomatic anemia to UGI AVM bleed - s/p APC x 4 lesions in 2/19 - Hgb stable today at 16.0 - No evidence of bleeding - Continue PPI - Off ASA due to GIB.   4. H/O of AKI on CKD Stage III:  - Creatinine stable at 1.65 today (baseline 1.3 - 1.5). Will follow. May  need to cut back lasix to every other day. No change today  5. VAD management -VAD interrogated personally. Parameters stable. - LDH 199 - INR goal 2.0-2.5 Off ASA due to GIB 2/19.  - INR 1.4 will give 1/2 dose lovenox for 2 days. Discussed dosing with PharmD personally. - MAPs stable 70-80s  6. CAD:  s/p PCI RCA/PLOM with DES x2 and DES to mild LAD 1/18 - No s/s ischemia - Continue statin. Off ASA due to GIB  7. Hypomagnesemia. - Continue magnesium oxide 400 TID.   Total time spent 35 minutes. Over half that time spent discussing above.   Glori Bickers, MD  9:50 PM

## 2019-09-09 ENCOUNTER — Ambulatory Visit (HOSPITAL_COMMUNITY): Payer: Self-pay | Admitting: Pharmacist

## 2019-09-09 ENCOUNTER — Telehealth (HOSPITAL_COMMUNITY): Payer: Self-pay | Admitting: *Deleted

## 2019-09-09 DIAGNOSIS — Z95811 Presence of heart assist device: Secondary | ICD-10-CM

## 2019-09-09 LAB — POCT INR: INR: 1.4 — AB (ref 2.0–3.0)

## 2019-09-09 MED ORDER — WARFARIN SODIUM 2 MG PO TABS: ORAL_TABLET | ORAL | 11 refills | Status: DC

## 2019-09-09 NOTE — Telephone Encounter (Signed)
MD INR reported INR 1.4.  Routed to [employer]

## 2019-09-09 NOTE — Progress Notes (Signed)
LVAD INR 

## 2019-09-16 ENCOUNTER — Ambulatory Visit (HOSPITAL_COMMUNITY): Payer: Self-pay | Admitting: Pharmacist

## 2019-09-16 DIAGNOSIS — Z95811 Presence of heart assist device: Secondary | ICD-10-CM

## 2019-09-16 LAB — POCT INR: INR: 1.7 — AB (ref 2.0–3.0)

## 2019-09-16 MED ORDER — WARFARIN SODIUM 2 MG PO TABS: ORAL_TABLET | ORAL | 11 refills | Status: DC

## 2019-09-16 NOTE — Progress Notes (Signed)
LVAD INR 

## 2019-09-24 ENCOUNTER — Ambulatory Visit (HOSPITAL_COMMUNITY): Payer: Self-pay | Admitting: Pharmacist

## 2019-09-24 DIAGNOSIS — Z95811 Presence of heart assist device: Secondary | ICD-10-CM

## 2019-09-24 LAB — POCT INR: INR: 1.9 — AB (ref 2.0–3.0)

## 2019-09-24 NOTE — Progress Notes (Signed)
LVAD INR 

## 2019-09-30 ENCOUNTER — Ambulatory Visit (HOSPITAL_COMMUNITY): Payer: Self-pay | Admitting: Pharmacist

## 2019-09-30 DIAGNOSIS — Z95811 Presence of heart assist device: Secondary | ICD-10-CM

## 2019-09-30 LAB — POCT INR: INR: 1.8 — AB (ref 2.0–3.0)

## 2019-09-30 MED ORDER — WARFARIN SODIUM 2 MG PO TABS: ORAL_TABLET | ORAL | 11 refills | Status: DC

## 2019-09-30 NOTE — Progress Notes (Signed)
LVAD INR 

## 2019-10-07 ENCOUNTER — Telehealth (HOSPITAL_COMMUNITY): Payer: Self-pay | Admitting: Licensed Clinical Social Worker

## 2019-10-07 ENCOUNTER — Ambulatory Visit (HOSPITAL_COMMUNITY): Payer: Self-pay | Admitting: Pharmacist

## 2019-10-07 LAB — POCT INR: INR: 2.4 (ref 2.0–3.0)

## 2019-10-07 NOTE — Progress Notes (Signed)
LVAD INR 

## 2019-10-07 NOTE — Telephone Encounter (Signed)
CSW contacted patient to inform of LVAD Support Group meeting. Message left.  Raquel Sarna, [place], [place]

## 2019-10-14 ENCOUNTER — Ambulatory Visit (HOSPITAL_COMMUNITY): Payer: Self-pay | Admitting: Pharmacist

## 2019-10-14 LAB — POCT INR: INR: 2.8 (ref 2.0–3.0)

## 2019-10-14 NOTE — Progress Notes (Signed)
LVAD INR 

## 2019-10-21 ENCOUNTER — Ambulatory Visit (HOSPITAL_COMMUNITY): Payer: Self-pay | Admitting: Pharmacist

## 2019-10-21 LAB — POCT INR: INR: 2.2 (ref 2.0–3.0)

## 2019-10-21 NOTE — Progress Notes (Signed)
LVAD INR 

## 2019-10-29 ENCOUNTER — Telehealth (HOSPITAL_COMMUNITY): Payer: Self-pay

## 2019-10-29 ENCOUNTER — Ambulatory Visit (HOSPITAL_COMMUNITY): Payer: Self-pay | Admitting: Pharmacist

## 2019-10-29 LAB — POCT INR: INR: 3.2 — AB (ref 2.0–3.0)

## 2019-10-29 NOTE — Telephone Encounter (Signed)
[place] called yesterday 11/17/2019 and left a voicemail on the triage line to report that the patients INR yesterday was 3.2. for questions you can [place] at [phone] option 3.

## 2019-10-29 NOTE — Progress Notes (Signed)
LVAD INR 

## 2019-10-30 ENCOUNTER — Ambulatory Visit (INDEPENDENT_AMBULATORY_CARE_PROVIDER_SITE_OTHER): Payer: Medicare HMO | Admitting: *Deleted

## 2019-10-30 DIAGNOSIS — I255 Ischemic cardiomyopathy: Secondary | ICD-10-CM | POA: Diagnosis not present

## 2019-10-30 LAB — CUP PACEART REMOTE DEVICE CHECK
Battery Remaining Longevity: 72 mo
Battery Voltage: 2.98 V
Brady Statistic AP VP Percent: 0 %
Brady Statistic AP VS Percent: 0 %
Brady Statistic AS VP Percent: 0.02 %
Brady Statistic AS VS Percent: 99.98 %
Brady Statistic RA Percent Paced: 0 %
Brady Statistic RV Percent Paced: 0.02 %
Date Time Interrogation Session: 20210707073829
HighPow Impedance: 70 Ohm
Implantable Lead Implant Date: 20180409
Implantable Lead Implant Date: 20180409
Implantable Lead Implant Date: 20180716
Implantable Lead Implant Date: 20180716
Implantable Lead Location: 753858
Implantable Lead Location: 753858
Implantable Lead Location: 753859
Implantable Lead Location: 753860
Implantable Lead Model: 5076
Implantable Lead Model: 511212
Implantable Lead Model: 511212
Implantable Lead Serial Number: 263773
Implantable Lead Serial Number: 263775
Implantable Pulse Generator Implant Date: 20180409
Lead Channel Impedance Value: 1026 Ohm
Lead Channel Impedance Value: 323 Ohm
Lead Channel Impedance Value: 3914 Ohm
Lead Channel Impedance Value: 4047 Ohm
Lead Channel Impedance Value: 437 Ohm
Lead Channel Impedance Value: 437 Ohm
Lead Channel Pacing Threshold Amplitude: 0.625 V
Lead Channel Pacing Threshold Amplitude: 0.75 V
Lead Channel Pacing Threshold Amplitude: 1.75 V
Lead Channel Pacing Threshold Pulse Width: 0.4 ms
Lead Channel Pacing Threshold Pulse Width: 0.4 ms
Lead Channel Pacing Threshold Pulse Width: 1 ms
Lead Channel Sensing Intrinsic Amplitude: 13.75 mV
Lead Channel Sensing Intrinsic Amplitude: 13.75 mV
Lead Channel Sensing Intrinsic Amplitude: 3.375 mV
Lead Channel Sensing Intrinsic Amplitude: 3.375 mV
Lead Channel Setting Pacing Amplitude: 2.5 V
Lead Channel Setting Pacing Pulse Width: 0.4 ms
Lead Channel Setting Sensing Sensitivity: 0.6 mV

## 2019-11-01 NOTE — Progress Notes (Signed)
Remote ICD transmission.   

## 2019-11-04 ENCOUNTER — Ambulatory Visit (HOSPITAL_COMMUNITY): Payer: Self-pay | Admitting: Pharmacist

## 2019-11-04 LAB — POCT INR: INR: 2.5 (ref 2.0–3.0)

## 2019-11-04 NOTE — Progress Notes (Signed)
LVAD INR 

## 2019-11-06 ENCOUNTER — Encounter (HOSPITAL_COMMUNITY): Payer: Medicare HMO

## 2019-11-11 ENCOUNTER — Ambulatory Visit (HOSPITAL_COMMUNITY): Payer: Self-pay | Admitting: Pharmacist

## 2019-11-11 LAB — POCT INR: INR: 3.3 — AB (ref 2.0–3.0)

## 2019-11-11 NOTE — Progress Notes (Signed)
LVAD INR 

## 2019-11-12 ENCOUNTER — Other Ambulatory Visit (HOSPITAL_COMMUNITY): Payer: Self-pay | Admitting: *Deleted

## 2019-11-12 DIAGNOSIS — Z95811 Presence of heart assist device: Secondary | ICD-10-CM

## 2019-11-12 DIAGNOSIS — Z79899 Other long term (current) drug therapy: Secondary | ICD-10-CM

## 2019-11-12 DIAGNOSIS — Z7901 Long term (current) use of anticoagulants: Secondary | ICD-10-CM

## 2019-11-13 ENCOUNTER — Other Ambulatory Visit: Payer: Self-pay

## 2019-11-13 ENCOUNTER — Ambulatory Visit (HOSPITAL_COMMUNITY)
Admission: RE | Admit: 2019-11-13 | Discharge: 2019-11-13 | Disposition: A | Discharge status: Home / Self Care | Payer: Medicare HMO | Source: Ambulatory Visit | Attending: Internal Medicine | Admitting: Internal Medicine

## 2019-11-13 VITALS — BP 100/0 | HR 86 | Ht 65.0 in | Wt 181.6 lb

## 2019-11-13 DIAGNOSIS — Z7901 Long term (current) use of anticoagulants: Secondary | ICD-10-CM | POA: Diagnosis not present

## 2019-11-13 DIAGNOSIS — I10 Essential (primary) hypertension: Secondary | ICD-10-CM

## 2019-11-13 DIAGNOSIS — Z95811 Presence of heart assist device: Secondary | ICD-10-CM | POA: Diagnosis not present

## 2019-11-13 DIAGNOSIS — I480 Paroxysmal atrial fibrillation: Secondary | ICD-10-CM

## 2019-11-13 DIAGNOSIS — I5022 Chronic systolic (congestive) heart failure: Secondary | ICD-10-CM

## 2019-11-13 DIAGNOSIS — Z79899 Other long term (current) drug therapy: Secondary | ICD-10-CM | POA: Diagnosis not present

## 2019-11-13 LAB — CBC
HCT: 51.3 % (ref 39.0–52.0)
Hemoglobin: 15.7 g/dL (ref 13.0–17.0)
MCH: 26.1 pg (ref 26.0–34.0)
MCHC: 30.6 g/dL (ref 30.0–36.0)
MCV: 85.4 fL (ref 80.0–100.0)
Platelets: 173 10*3/uL (ref 150–400)
RBC: 6.01 MIL/uL — ABNORMAL HIGH (ref 4.22–5.81)
RDW: 14.2 % (ref 11.5–15.5)
WBC: 8 10*3/uL (ref 4.0–10.5)
nRBC: 0 % (ref 0.0–0.2)

## 2019-11-13 LAB — COMPREHENSIVE METABOLIC PANEL
ALT: 23 U/L (ref 0–44)
AST: 26 U/L (ref 15–41)
Albumin: 3.3 g/dL — ABNORMAL LOW (ref 3.5–5.0)
Alkaline Phosphatase: 102 U/L (ref 38–126)
Anion gap: 11 (ref 5–15)
BUN: 12 mg/dL (ref 6–20)
CO2: 24 mmol/L (ref 22–32)
Calcium: 9.3 mg/dL (ref 8.9–10.3)
Chloride: 104 mmol/L (ref 98–111)
Creatinine, Ser: 1.67 mg/dL — ABNORMAL HIGH (ref 0.61–1.24)
GFR calc Af Amer: 53 mL/min — ABNORMAL LOW (ref >60)
GFR calc non Af Amer: 45 mL/min — ABNORMAL LOW (ref >60)
Glucose, Bld: 93 mg/dL (ref 70–99)
Potassium: 4 mmol/L (ref 3.5–5.1)
Sodium: 139 mmol/L (ref 135–145)
Total Bilirubin: 1.1 mg/dL (ref 0.3–1.2)
Total Protein: 7.9 g/dL (ref 6.5–8.1)

## 2019-11-13 LAB — PROTIME-INR
INR: 2.2 — ABNORMAL HIGH (ref 0.8–1.2)
Prothrombin Time: 23.5 seconds — ABNORMAL HIGH (ref 11.4–15.2)

## 2019-11-13 LAB — PREALBUMIN: Prealbumin: 28.1 mg/dL (ref 18–38)

## 2019-11-13 LAB — TSH: TSH: 2.873 u[IU]/mL (ref 0.350–4.500)

## 2019-11-13 LAB — LACTATE DEHYDROGENASE: LDH: 190 U/L (ref 98–192)

## 2019-11-13 LAB — MAGNESIUM: Magnesium: 1.6 mg/dL — ABNORMAL LOW (ref 1.7–2.4)

## 2019-11-13 NOTE — Progress Notes (Addendum)
Patient presents for 2 month follow up in [facility] today with his wife. Reports no problems with VAD equipment or concerns with drive line.    Pt states that he has been doing well since his last visit. He states that he is feeling great and has a lot of energy. He states that they are able to go back to church and he is enjoying that.    Pt is in NSR today. Last transmission was on 10/30/19 pt did not have any afib events. Pt has been taking Amio 100 mg daily; will continue Amio 100 mg daily per Dr. Haroldine Laws  pts Magnesium is low today at 1.6, pt reports that he is only taking 400 mg daily but according to our records prescription is for twice a day. Pt instructed to increase to twice a day.  Vital Signs:  Doppler Pressure: 100 Automatc BP: 106/70 (88) HR: 86 SR  SPO2: 97% RA  Weight: 181.6 lb w/o eqt Last weight: 180.2 lb Home weights: 168 - 169 lbs  VAD Indication: Destination therapy- CKD  VAD interrogation & Equipment Management: Speed: 5400 Flow: 4.2 Power: 4.1 w    PI: 6.3  Alarms: none Events:  20-40 PI events daily  Fixed speed 5400 Low speed limit: 5100  Primary Controller:  Replace back up battery in 25 months.  Back up controller:  Replace back up battery in 25 months.  Annual Equipment Maintenance on UBC/PM was performed on 05/2018.   I reviewed the LVAD parameters from today and compared the results to the patient's prior recorded data. LVAD interrogation was NEGATIVE for significant power changes, negative for clinical alarms and STABLE for PI events/speed drops. No programming changes were made and pump is functioning within specified parameters. Pt is performing daily controller and system monitor self tests along with completing weekly and monthly maintenance for LVAD equipment.  LVAD equipment check completed and is in good working order. Back up equipment present.    Exit Site Care: Drive line is being maintained weekly by [name], his wife.  Exit site healed and incorporated, the velour is fully implanted at exit site. No redness, drainage or foul odor noted. Pt denies fever or chills. Pt given 8 weekly dressings at this visit.   Significant Events on VAD Support:  none  Device:Medtronic Bi-V Therapies: on Last check: 10/30/2019   BP & Labs:  Doppler BP 100 - Doppler is reflecting Modified systolic.  Hgb 15.7 - No S/S of bleeding. Specifically denies melena/BRBPR or nosebleeds.  LDH stable at 190with established baseline of 165- 250. Denies tea-colored urine. No power elevations noted on interrogation.   Patient completed 1360 feet during 6 minute walk test. Tolerated well.   Neurocognitive trail making completed correctly in 2 m 32 s.   [address] Cardiomyopathy, EQ-5D-3L and post-VAD QOL completed by the patient independently.  [facility] Cardiomyopathy Questionnaire  KCCQ-12 11/13/2019  1 a. Ability to shower/bathe Not at all limited  1 b. Ability to walk 1 block Not at all limited  1 c. Ability to hurry/jog Not at all limited  2. Edema feet/ankles/legs Never over the past 2 weeks  3. Limited by fatigue Never over the past 2 weeks  4. Limited by dyspnea Never over the past 2 weeks  5. Sitting up / on 3+ pillows Never over the past 2 weeks  6. Limited enjoyment of life Slightly limited  7. Rest of life w/ symptoms Mostly satisfied  8 a. Participation in hobbies Slightly limited  8 b. Participation in chores Slightly limited  8 c. Visiting family/friends Did not limit at all     Patient Instructions:  1. No change in medications 2. Return to clinic in 2 months  Tanda Rockers RN [place] Coordinator  Office: [phone]  24/7 Pager: [phone]

## 2019-11-13 NOTE — Patient Instructions (Signed)
1. No change in medications 2. Return to clinic in 2 months

## 2019-11-15 LAB — T4: T4, Total: 10.6 ug/dL (ref 4.5–12.0)

## 2019-11-18 ENCOUNTER — Ambulatory Visit (HOSPITAL_COMMUNITY): Payer: Self-pay | Admitting: Pharmacist

## 2019-11-18 LAB — POCT INR: INR: 2.8 (ref 2.0–3.0)

## 2019-11-18 NOTE — Progress Notes (Signed)
LVAD INR 

## 2019-11-19 NOTE — Progress Notes (Signed)
LVAD Clinic Follow Up Note  HPI:  David [name] is a 54 y.o. male with history of ICD (09 PCI to LAD, mRCA, '10 PCI to Lcx and '12 mRCA), HFrEF (EF ~20% for >5 years), HLD and HTN. S/P LVAD HM-III 04/20/18.   Admitted 04/19/17 through 05/27/2017 for scheduled LVAD implant. On 12/27 he underwent HMIII LVAD implant. Hospital course complicated by PNA with achromobacter tracheal aspirate, shock liver, and AKI. Required short term dialysis.  Readmitted 05/30/17 with symptomatic anemia hgb 3.8.  INR 2.9. He was transfused with 4 units of packed red blood cell. 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  It was suspected AVMs were the likely cause of bleeding in the setting of anticoagulation.  He will need colonoscopy at some point, this can be done as outpatient. Discharged 06/04/17.  Underwent DC-CV for AFL on 10/17/18   Follow up for Heart Failure/LVAD:  Presents for routine f/u with his wife. Continues to feel great. Does all activities without any limitations. No recurrent AF. Denies orthopnea or PND. No fevers, chills or problems with driveline. No bleeding, melena or neuro symptoms. No VAD alarms. Taking all meds as prescribed. Recent ICD interrogation with normal function no VT or recurrent AF.    VAD Indication: Destination therapy- CKD  VAD interrogation & Equipment Management: Speed: 5400 Flow: 4.2 Power: 4.1 w PI: 6.3  Alarms: none Events:  20-40 PI events daily  Fixed speed 5400 Low speed limit: 5100  Primary Controller:  Replace back up battery in 25 months.  Back up controller: Replace back up battery in 52months.    Past Medical History:  Diagnosis Date  . AICD (automatic cardioverter/defibrillator) present   . Anemia   . Anxiety   . CAD (coronary artery disease) 2009   AMI in 12/2007 with PCI  to LAD, staged PCI to  mid/distal RCA, NSTEMI in 02/2009 with BMS to LCx  . CHF (congestive heart failure) ([place])   . Chronic kidney disease 11/03/2016   stage 3 kidney disease  . Dysrhythmia    "arrythmia problems at some point", "fatal rhythms"  . History of blood transfusion    "I was bleeding from was rectum" (04/19/2017)  . HLD (hyperlipidemia)   . HTN (hypertension)   . Ischemic cardiomyopathy    Admitted in 07/2010 with CHF exacerbation  . Myocardial infarction ([place])    "I've had 4" (04/19/2017)  . Pneumonia 2018 X 1  . Seizures ([place])    one seizure in 04/2016 during cardiac event (04/19/2017)    Current Outpatient Medications  Medication Sig Dispense Refill  . amiodarone (PACERONE) 200 MG tablet Take 0.5 tablets (100 mg total) by mouth daily. 90 tablet 3  . atorvastatin (LIPITOR) 40 MG tablet Take 1 tab daily 90 tablet 3  . furosemide (LASIX) 40 MG tablet TAKE 1 TABLET BY MOUTH ONCE DAILY. 90 tablet 6  . gabapentin (NEURONTIN) 300 MG capsule Take 1 capsule (300 mg total) by mouth 2 (two) times daily. 60 capsule 6  . losartan (COZAAR) 50 MG tablet Take 1 tablet (50 mg total) by mouth daily. 90 tablet 3  . magnesium oxide (MAG-OX) 400 MG tablet Take 1 tablet (400 mg total) by mouth 2 (two) times daily. 60 tablet 6  . pantoprazole (PROTONIX) 40 MG tablet Take 1 tablet (40 mg total) by mouth daily. 90 tablet 3  . potassium chloride  SA (KLOR-CON M20) 20 MEQ tablet Take 1 tablet (20 mEq total) by mouth daily. KEEP OV. 90 tablet 3  . traZODone (DESYREL) 50 MG tablet Take 1 tablet (50 mg total) by mouth at bedtime. 30 tablet 6  . warfarin (COUMADIN) 2 MG tablet Take 4 mg (2 tabs) every Monday/Wednesday/Friday and 2 mg (1 tab) all other days or as directed by HF clinic 60 tablet 11  . enoxaparin (LOVENOX) 40 MG/0.4ML injection Inject 0.4 mLs (40 mg total) into the skin every 12 (twelve) hours. (Patient not taking: Reported on 09/05/2019) 4 mL 0  . Multiple Vitamins-Minerals (ADULT GUMMY)  CHEW Chew 2 tablets by mouth daily. (Patient not taking: Reported on 11/13/2019)    . vitamin C (ASCORBIC ACID) 500 MG tablet Take 500 mg by mouth daily as needed (pt prefrence). (Patient not taking: Reported on 11/13/2019)     No current facility-administered medications for this encounter.    Plavix [clopidogrel bisulfate]  Review of systems complete and found to be negative unless listed in HPI.      Vital Signs:  Doppler Pressure: 100 Automatc BP: 106/70 (88) HR: 86 SR  SPO2: 97% RA  Weight: 181.6 lb w/o eqt Last weight: 180.2 lb Home weights: 168 - 169 lbs  General:  NAD.  HEENT: normal  Neck: supple. JVP not elevated.  Carotids 2+ bilat; no bruits. No lymphadenopathy or thryomegaly appreciated. Cor: LVAD hum.  Lungs: Clear. Abdomen: obese soft, nontender, non-distended. No hepatosplenomegaly. No bruits or masses. Good bowel sounds. Driveline site clean. Anchor in place.  Extremities: no cyanosis, clubbing, rash. Warm no edema  Neuro: alert & oriented x 3. No focal deficits. Moves all 4 without problem    ASSESSMENT AND PLAN:   1. Chronic systolic CHF with HM III LVAD implant 04/20/17:  - Continues to do very well with VAD support. - Stable NYHA I - Volume status looks good. Continue lasix 40 daily Can cut back as needed - He has been seen at [facility] for transplant referral but has decided not to pursue VAD for now. No change  2. Recurrent AFL with RVR - had DC-CV on 10/17/18 with subsequent short episodes of AF after - Remains in NSR today. No breakthrough AF on device interrogation. Continue amio 100 daily.     3. Symptomatic anemia to UGI AVM bleed - s/p APC x 4 lesions in 2/19 - Hgb stable today at 15.7 - No evidence of bleeding - Continue PPI - Off ASA due to GIB.   4. H/O of AKI on CKD Stage III:  - Creatinine stable at 1.67 today (baseline 1.3 - 1.5). Will follow. As above, may need to cut back lasix to every other day. No change  5. VAD management -VAD  interrogated personally. Parameters stable. - LDH 190 - INR goal 2.0-2.5 Off ASA due to GIB 2/19.  - INR 2.2 Discussed dosing with PharmD personally. - MAPs stable 70-80s  6. CAD:  s/p PCI RCA/PLOM with DES x2 and DES to mild LAD 1/18 - No s/s of ischemia - Continue statin. Off ASA due to GIB  7. Hypomagnesemia. - Reminded him to take his mag supplements  Total time spent 35 minutes. Over half that time spent discussing above.    Glori Bickers, MD  8:13 AM

## 2019-11-25 ENCOUNTER — Ambulatory Visit (HOSPITAL_COMMUNITY): Payer: Self-pay | Admitting: Pharmacist

## 2019-11-25 LAB — POCT INR: INR: 2.6 (ref 2.0–3.0)

## 2019-11-25 NOTE — Progress Notes (Signed)
LVAD INR 

## 2019-12-02 ENCOUNTER — Ambulatory Visit (HOSPITAL_COMMUNITY): Payer: Self-pay | Admitting: Pharmacist

## 2019-12-02 DIAGNOSIS — Z95811 Presence of heart assist device: Secondary | ICD-10-CM

## 2019-12-02 LAB — POCT INR: INR: 3 (ref 2.0–3.0)

## 2019-12-02 MED ORDER — WARFARIN SODIUM 2 MG PO TABS: ORAL_TABLET | ORAL | 11 refills | Status: DC

## 2019-12-02 NOTE — Progress Notes (Signed)
LVAD INR 

## 2019-12-09 ENCOUNTER — Ambulatory Visit (HOSPITAL_COMMUNITY): Payer: Self-pay | Admitting: Pharmacist

## 2019-12-09 LAB — POCT INR: INR: 2.9 (ref 2.0–3.0)

## 2019-12-09 NOTE — Progress Notes (Signed)
LVAD INR 

## 2019-12-12 ENCOUNTER — Other Ambulatory Visit (HOSPITAL_COMMUNITY): Payer: Self-pay | Admitting: Internal Medicine

## 2019-12-12 DIAGNOSIS — G4700 Insomnia, unspecified: Secondary | ICD-10-CM

## 2019-12-12 DIAGNOSIS — Z95811 Presence of heart assist device: Secondary | ICD-10-CM

## 2019-12-16 ENCOUNTER — Ambulatory Visit (HOSPITAL_COMMUNITY): Payer: Self-pay | Admitting: Pharmacist

## 2019-12-16 LAB — POCT INR
INR: 2.4 (ref 2.0–3.0)
INR: 2.4 (ref 2.0–3.0)

## 2019-12-16 NOTE — Progress Notes (Signed)
LVAD INR 

## 2019-12-23 ENCOUNTER — Ambulatory Visit (HOSPITAL_COMMUNITY): Payer: Self-pay | Admitting: Pharmacist

## 2019-12-23 LAB — POCT INR: INR: 2.8 (ref 2.0–3.0)

## 2019-12-23 NOTE — Progress Notes (Signed)
LVAD INR 

## 2019-12-25 ENCOUNTER — Other Ambulatory Visit (HOSPITAL_COMMUNITY): Payer: Self-pay | Admitting: *Deleted

## 2019-12-25 DIAGNOSIS — G629 Polyneuropathy, unspecified: Secondary | ICD-10-CM

## 2019-12-25 DIAGNOSIS — Z95811 Presence of heart assist device: Secondary | ICD-10-CM

## 2019-12-25 DIAGNOSIS — I5043 Acute on chronic combined systolic (congestive) and diastolic (congestive) heart failure: Secondary | ICD-10-CM

## 2019-12-25 MED ORDER — ATORVASTATIN CALCIUM 40 MG PO TABS: ORAL_TABLET | ORAL | 3 refills | Status: DC

## 2019-12-25 MED ORDER — GABAPENTIN 300 MG PO CAPS: 300.0000 mg | ORAL_CAPSULE | Freq: Two times a day (BID) | ORAL | 6 refills | Status: DC

## 2019-12-25 MED ORDER — POTASSIUM CHLORIDE CRYS ER 20 MEQ PO TBCR: 20.0000 meq | EXTENDED_RELEASE_TABLET | Freq: Every day | ORAL | 3 refills | Status: DC

## 2019-12-25 NOTE — Telephone Encounter (Signed)
Received page from patient's wife requesting refills for the following: Atorvastatin, Gabapentin, Potassium, and Trazodone. Refill requests sent in to [facility]; Trazodone refill called to pharmacy per Dr Haroldine Laws.   Emerson Monte RN [place] Coordinator  Office: [phone]  24/7 Pager: [phone]

## 2019-12-31 ENCOUNTER — Ambulatory Visit (HOSPITAL_COMMUNITY): Payer: Self-pay | Admitting: Pharmacist

## 2019-12-31 LAB — POCT INR: INR: 2.8 (ref 2.0–3.0)

## 2019-12-31 NOTE — Progress Notes (Signed)
LVAD INR 

## 2020-01-06 ENCOUNTER — Ambulatory Visit (HOSPITAL_COMMUNITY): Payer: Self-pay | Admitting: Pharmacist

## 2020-01-06 LAB — POCT INR: INR: 2.5 (ref 2.0–3.0)

## 2020-01-06 NOTE — Progress Notes (Signed)
LVAD INR 

## 2020-01-07 ENCOUNTER — Other Ambulatory Visit (HOSPITAL_COMMUNITY): Payer: Self-pay | Admitting: Internal Medicine

## 2020-01-07 DIAGNOSIS — I4892 Unspecified atrial flutter: Secondary | ICD-10-CM

## 2020-01-07 DIAGNOSIS — Z95811 Presence of heart assist device: Secondary | ICD-10-CM

## 2020-01-13 ENCOUNTER — Ambulatory Visit (HOSPITAL_COMMUNITY): Payer: Self-pay | Admitting: Pharmacist

## 2020-01-13 ENCOUNTER — Other Ambulatory Visit (HOSPITAL_COMMUNITY): Payer: Self-pay | Admitting: Unknown Physician Specialty

## 2020-01-13 DIAGNOSIS — Z7901 Long term (current) use of anticoagulants: Secondary | ICD-10-CM

## 2020-01-13 DIAGNOSIS — Z95811 Presence of heart assist device: Secondary | ICD-10-CM

## 2020-01-13 LAB — POCT INR: INR: 2.3 (ref 2.0–3.0)

## 2020-01-13 NOTE — Progress Notes (Signed)
LVAD INR 

## 2020-01-14 ENCOUNTER — Telehealth (HOSPITAL_COMMUNITY): Payer: Self-pay | Admitting: Adult Health

## 2020-01-14 ENCOUNTER — Ambulatory Visit (HOSPITAL_COMMUNITY)
Admission: RE | Admit: 2020-01-14 | Discharge: 2020-01-14 | Disposition: A | Discharge status: Home / Self Care | Payer: Medicare HMO | Source: Ambulatory Visit | Attending: Cardiology | Admitting: Cardiology

## 2020-01-14 ENCOUNTER — Other Ambulatory Visit: Payer: Self-pay

## 2020-01-14 VITALS — BP 74/0 | HR 82 | Ht 65.0 in | Wt 180.6 lb

## 2020-01-14 DIAGNOSIS — I10 Essential (primary) hypertension: Secondary | ICD-10-CM | POA: Diagnosis not present

## 2020-01-14 DIAGNOSIS — I5022 Chronic systolic (congestive) heart failure: Secondary | ICD-10-CM | POA: Insufficient documentation

## 2020-01-14 DIAGNOSIS — Z955 Presence of coronary angioplasty implant and graft: Secondary | ICD-10-CM | POA: Insufficient documentation

## 2020-01-14 DIAGNOSIS — I252 Old myocardial infarction: Secondary | ICD-10-CM | POA: Diagnosis not present

## 2020-01-14 DIAGNOSIS — I130 Hypertensive heart and chronic kidney disease with heart failure and stage 1 through stage 4 chronic kidney disease, or unspecified chronic kidney disease: Secondary | ICD-10-CM | POA: Insufficient documentation

## 2020-01-14 DIAGNOSIS — I255 Ischemic cardiomyopathy: Secondary | ICD-10-CM | POA: Insufficient documentation

## 2020-01-14 DIAGNOSIS — E785 Hyperlipidemia, unspecified: Secondary | ICD-10-CM | POA: Diagnosis not present

## 2020-01-14 DIAGNOSIS — N183 Chronic kidney disease, stage 3 unspecified: Secondary | ICD-10-CM | POA: Insufficient documentation

## 2020-01-14 DIAGNOSIS — Z79899 Other long term (current) drug therapy: Secondary | ICD-10-CM | POA: Insufficient documentation

## 2020-01-14 DIAGNOSIS — Z95811 Presence of heart assist device: Secondary | ICD-10-CM | POA: Insufficient documentation

## 2020-01-14 DIAGNOSIS — D649 Anemia, unspecified: Secondary | ICD-10-CM | POA: Insufficient documentation

## 2020-01-14 DIAGNOSIS — I4892 Unspecified atrial flutter: Secondary | ICD-10-CM

## 2020-01-14 DIAGNOSIS — Z7901 Long term (current) use of anticoagulants: Secondary | ICD-10-CM | POA: Diagnosis not present

## 2020-01-14 DIAGNOSIS — I5043 Acute on chronic combined systolic (congestive) and diastolic (congestive) heart failure: Secondary | ICD-10-CM

## 2020-01-14 DIAGNOSIS — Z95810 Presence of automatic (implantable) cardiac defibrillator: Secondary | ICD-10-CM | POA: Insufficient documentation

## 2020-01-14 DIAGNOSIS — I2510 Atherosclerotic heart disease of native coronary artery without angina pectoris: Secondary | ICD-10-CM | POA: Insufficient documentation

## 2020-01-14 LAB — CBC
HCT: 56.3 % — ABNORMAL HIGH (ref 39.0–52.0)
Hemoglobin: 17.6 g/dL — ABNORMAL HIGH (ref 13.0–17.0)
MCH: 26.7 pg (ref 26.0–34.0)
MCHC: 31.3 g/dL (ref 30.0–36.0)
MCV: 85.4 fL (ref 80.0–100.0)
Platelets: 186 10*3/uL (ref 150–400)
RBC: 6.59 MIL/uL — ABNORMAL HIGH (ref 4.22–5.81)
RDW: 14 % (ref 11.5–15.5)
WBC: 7.9 10*3/uL (ref 4.0–10.5)
nRBC: 0 % (ref 0.0–0.2)

## 2020-01-14 LAB — BASIC METABOLIC PANEL
Anion gap: 11 (ref 5–15)
BUN: 17 mg/dL (ref 6–20)
CO2: 29 mmol/L (ref 22–32)
Calcium: 9.7 mg/dL (ref 8.9–10.3)
Chloride: 99 mmol/L (ref 98–111)
Creatinine, Ser: 1.93 mg/dL — ABNORMAL HIGH (ref 0.61–1.24)
GFR calc Af Amer: 44 mL/min — ABNORMAL LOW (ref >60)
GFR calc non Af Amer: 38 mL/min — ABNORMAL LOW (ref >60)
Glucose, Bld: 94 mg/dL (ref 70–99)
Potassium: 3.9 mmol/L (ref 3.5–5.1)
Sodium: 139 mmol/L (ref 135–145)

## 2020-01-14 LAB — LACTATE DEHYDROGENASE: LDH: 224 U/L — ABNORMAL HIGH (ref 98–192)

## 2020-01-14 LAB — MAGNESIUM: Magnesium: 1.9 mg/dL (ref 1.7–2.4)

## 2020-01-14 MED ORDER — WARFARIN SODIUM 2 MG PO TABS: ORAL_TABLET | ORAL | 11 refills | Status: DC

## 2020-01-14 MED ORDER — FUROSEMIDE 40 MG PO TABS: 40.0000 mg | ORAL_TABLET | ORAL | 6 refills | Status: DC

## 2020-01-14 MED ORDER — AMIODARONE HCL 200 MG PO TABS: 100.0000 mg | ORAL_TABLET | Freq: Every day | ORAL | 6 refills | Status: DC

## 2020-01-14 NOTE — Telephone Encounter (Signed)
Please call.   Renal function trending up. Please advise to change lasix to 40 mg every other day.   Lab Results  Component Value Date   CREATININE 1.93 (H) 01/14/2020   CREATININE 1.67 (H) 11/13/2019   CREATININE 1.65 (H) 09/05/2019   Milda Lindvall NP-C  1:32 PM

## 2020-01-14 NOTE — Patient Instructions (Signed)
1. No change in medications 2. Return to clinic in 2 months

## 2020-01-14 NOTE — Progress Notes (Signed)
LVAD Clinic Follow Up Note HF MD: Dr Haroldine Laws  HPI: David [name] is a 53 y.o. male with history of ICD (09 PCI to LAD, mRCA, '10 PCI to Lcx and '12 mRCA), HFrEF (EF ~20% for >5 years), HLD and HTN. S/P LVAD HM-III 04/20/18.   Admitted 04/19/17 through 05/27/2017 for scheduled LVAD implant. On 12/27 he underwent HMIII LVAD implant. Hospital course complicated by PNA with achromobacter tracheal aspirate, shock liver, and AKI. Required short term dialysis.  Readmitted 05/30/17 with symptomatic anemia hgb 3.8.  INR 2.9. He was transfused with 4 units of packed red blood cell. 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  It was suspected AVMs were the likely cause of bleeding in the setting of anticoagulation.  He will need colonoscopy at some point, this can be done as outpatient. Discharged 06/04/17.  Underwent DC-CV for AFL on 10/17/18   Follow up for Heart Failure/LVAD: Today he returns for HF/LVAD follow up with his wife. Overall feeling fine. Denies SOB/PND/Orthopnea. No BRBPR. No falls.  Appetite ok. No fever or chills. Weight at home 179-181  pounds. Taking all medications. No issues with driveline.   VAD Indication: Destination therapy- CKD  VAD interrogation & Equipment Management: Speed: 5400 Flow: 4.7 Power: 4.6 w PI: 4.2  Alarms: none Events:  20-40 PI events daily Fixed speed 5400 Low speed limit: 5100 Primary Controller:  Replace back up battery in 25 months.  Back up controller: Replace back up battery in 20months.    Past Medical History:  Diagnosis Date  . AICD (automatic cardioverter/defibrillator) present   . Anemia   . Anxiety   . CAD (coronary artery disease) 2009   AMI in 12/2007 with PCI to LAD, staged PCI to  mid/distal RCA, NSTEMI in 02/2009 with BMS to LCx  . CHF (congestive heart  failure) ([place])   . Chronic kidney disease 11/03/2016   stage 3 kidney disease  . Dysrhythmia    "arrythmia problems at some point", "fatal rhythms"  . History of blood transfusion    "I was bleeding from was rectum" (04/19/2017)  . HLD (hyperlipidemia)   . HTN (hypertension)   . Ischemic cardiomyopathy    Admitted in 07/2010 with CHF exacerbation  . Myocardial infarction ([place])    "I've had 4" (04/19/2017)  . Pneumonia 2018 X 1  . Seizures ([place])    one seizure in 04/2016 during cardiac event (04/19/2017)    Current Outpatient Medications  Medication Sig Dispense Refill  . atorvastatin (LIPITOR) 40 MG tablet Take 1 tab daily 90 tablet 3  . furosemide (LASIX) 40 MG tablet TAKE 1 TABLET BY MOUTH ONCE DAILY. 90 tablet 6  . gabapentin (NEURONTIN) 300 MG capsule Take 1 capsule (300 mg total) by mouth 2 (two) times daily. 60 capsule 6  . losartan (COZAAR) 50 MG tablet Take 1 tablet (50 mg total) by mouth daily. 90 tablet 3  . magnesium oxide (MAG-OX) 400 MG tablet Take 1 tablet (400 mg total) by mouth 2 (two) times daily. 60 tablet 6  . PACERONE 200 MG tablet Take 1 tablet by mouth once daily (Patient taking differently: 100 mg. ) 30 tablet 3  . pantoprazole (PROTONIX) 40 MG tablet Take 1 tablet (40 mg total) by mouth daily. 90 tablet 3  . potassium chloride SA (KLOR-CON M20) 20 MEQ tablet Take 1 tablet (20 mEq total) by mouth daily.  KEEP OV. 90 tablet 3  . traZODone (DESYREL) 50 MG tablet TAKE 1 TABLET BY MOUTH ONCE DAILY AT BEDTIME 30 tablet 0  . warfarin (COUMADIN) 2 MG tablet Take 4 mg (2 tabs) every Monday/Friday and 2 mg (1 tab) all other days or as directed by HF clinic 60 tablet 11  . enoxaparin (LOVENOX) 40 MG/0.4ML injection Inject 0.4 mLs (40 mg total) into the skin every 12 (twelve) hours. (Patient not taking: Reported on 09/05/2019) 4 mL 0   No current facility-administered medications for this encounter.    Plavix [clopidogrel bisulfate]  Review of systems complete and found  to be negative unless listed in HPI.     There were no vitals filed for this visit. Wt Readings from Last 3 Encounters:  11/13/19 82.4 kg (181 lb 9.6 oz)  09/05/19 81.7 kg (180 lb 3.2 oz)  06/24/19 79.4 kg (175 lb)    Physical Exam: GENERAL: Well appearing, male who presents to clinic today in no acute distress. HEENT: normal  NECK: Supple, JVP 5-6  .  2+ bilaterally, no bruits.  No lymphadenopathy or thyromegaly appreciated.   CARDIAC:  Mechanical heart sounds with LVAD hum present.  LUNGS:  Clear to auscultation bilaterally.  ABDOMEN:  Soft, round, nontender, positive bowel sounds x4.     LVAD exit site: well-healed and incorporated.  Dressing dry and intact.  No erythema or drainage.  Stabilization device present and accurately applied.  Driveline dressing is being changed daily per sterile technique. EXTREMITIES:  Warm and dry, no cyanosis, clubbing, rash or edema  NEUROLOGIC:  Alert and oriented x 4.  Gait steady.  No aphasia.  No dysarthria.  Affect pleasant.      ASSESSMENT AND PLAN:   1. Chronic systolic CHF with HM III LVAD implant 04/20/17:  - Continues to do very well with VAD support. -NYHA I. Volume status stable.  - Renal function elevated. Change lasix to every other day.   - He has been seen at [facility] for transplant referral but has decided not to pursue VAD for now. No change  2. Recurrent AFL with RVR - had DC-CV on 10/17/18 with subsequent short episodes of AF after -No breakthrough AF on device interrogation. Continue amio 100 daily.     3. Symptomatic anemia to UGI AVM bleed - s/p APC x 4 lesions in 2/19 -No bleeding issues.  - Continue PPI - Off ASA due to GIB.  - CBC checked. Hemoglobin stable.   4. CKD Stage III:  - Creatinine baseline 1.3 - 1.5.  - Creatinine trending up 1.7>1.9.   5. VAD management -VAD interrogated personally. Parameters stable. - LDH 224 - INR goal 2.0-2.5 Off ASA due to GIB 2/19.  -Check INR  - MAPs in 70s   6. CAD:  s/p  PCI RCA/PLOM with DES x2 and DES to mild LAD 1/18 - no chest pain.  - Continue statin. Off ASA due to GIB  7. Hypomagnesemia. - Reminded him to take his mag supplements  Renal function trending up. Changing lasix to every other day.  Follow up in 2 months. Check labs today.   Greater than 50% of the (total minutes 40 ) visit spent in counseling/coordination of care regarding the above   . Darrick Grinder, NP  10:20 AM

## 2020-01-14 NOTE — Progress Notes (Signed)
Patient presents for 2 month follow up in [facility] today with his wife. Reports no problems with VAD equipment or concerns with drive line.    Pt states that he has been doing well since his last visit. He states that he is feeling great and has a lot of energy. He states that they are able to go back to church and he is enjoying that.    Pt is in NSR today. Last transmission was on 10/30/19 pt did not have any afib events. Pt has been taking Amio 100 mg daily.  pts Magnesium is low last visit at 1.6, pt reports that he is only taking 400 mg daily but according to our records prescription is for twice a day. Pt instructed to increase to twice a day at last visit. Today he reports that he thinks he increased but will double check when he gets home today. Mg level rechecked today.   pts CR trending up 1.94 today. Per Amy will instruct pt to decrease Lasix to qod.   Vital Signs:  Doppler Pressure: 74 Automatc BP: 98/50 (73) HR: 82 SR  SPO2: 95% RA  Weight: 180.6 lb w/o eqt Last weight: 181.6 lb Home weights: 168 - 169 lbs  VAD Indication: Destination therapy- CKD  VAD interrogation & Equipment Management: Speed: 5400 Flow: 4.7 Power: 4.6 w    PI: 4.2  Alarms: none Events:  20-40 PI events daily  Fixed speed 5400 Low speed limit: 5100  Primary Controller:  Replace back up battery in 23 months.  Back up controller:  Replace back up battery in 23 months.  Annual Equipment Maintenance on UBC/PM was performed on 05/2018.   I reviewed the LVAD parameters from today and compared the results to the patient's prior recorded data. LVAD interrogation was NEGATIVE for significant power changes, negative for clinical alarms and STABLE for PI events/speed drops. No programming changes were made and pump is functioning within specified parameters. Pt is performing daily controller and system monitor self tests along with completing weekly and monthly maintenance for LVAD  equipment.  LVAD equipment check completed and is in good working order. Back up equipment present.    Exit Site Care: Drive line is being maintained weekly by [name], his wife. Exit site healed and incorporated, the velour is fully implanted at exit site. No redness, drainage or foul odor noted. Pt denies fever or chills. Pt given 8 weekly dressings at this visit.   Significant Events on VAD Support:  none  Device:Medtronic Bi-V Therapies: on Last check: 10/30/2019   BP & Labs:  Doppler BP 74 - Doppler is reflecting MAP.  Hgb 17.6 - No S/S of bleeding. Specifically denies melena/BRBPR or nosebleeds.  LDH stable at 224with established baseline of 165- 250. Denies tea-colored urine. No power elevations noted on interrogation.    Patient Instructions:  1. Decrease Lasix to qod 2. Return to clinic in 2 months  Tanda Rockers RN [place] Coordinator  Office: [phone]  24/7 Pager: [phone]

## 2020-01-20 ENCOUNTER — Ambulatory Visit (HOSPITAL_COMMUNITY): Payer: Self-pay | Admitting: Pharmacist

## 2020-01-20 LAB — POCT INR: INR: 2.1 (ref 2.0–3.0)

## 2020-01-20 NOTE — Progress Notes (Signed)
LVAD INR 

## 2020-01-27 ENCOUNTER — Ambulatory Visit (HOSPITAL_COMMUNITY): Payer: Self-pay | Admitting: Pharmacist

## 2020-01-27 LAB — POCT INR: INR: 2.6 (ref 2.0–3.0)

## 2020-01-27 NOTE — Progress Notes (Signed)
LVAD INR 

## 2020-01-29 ENCOUNTER — Ambulatory Visit (INDEPENDENT_AMBULATORY_CARE_PROVIDER_SITE_OTHER): Payer: Medicare HMO

## 2020-01-29 DIAGNOSIS — I255 Ischemic cardiomyopathy: Secondary | ICD-10-CM

## 2020-01-30 LAB — CUP PACEART REMOTE DEVICE CHECK
Battery Remaining Longevity: 66 mo
Battery Voltage: 2.98 V
Brady Statistic AP VP Percent: 0 %
Brady Statistic AP VS Percent: 0 %
Brady Statistic AS VP Percent: 0.08 %
Brady Statistic AS VS Percent: 99.93 %
Brady Statistic RA Percent Paced: 0 %
Brady Statistic RV Percent Paced: 0.22 %
Date Time Interrogation Session: 20211006072408
HighPow Impedance: 68 Ohm
Implantable Lead Implant Date: 20180409
Implantable Lead Implant Date: 20180409
Implantable Lead Implant Date: 20180716
Implantable Lead Implant Date: 20180716
Implantable Lead Location: 753858
Implantable Lead Location: 753858
Implantable Lead Location: 753859
Implantable Lead Location: 753860
Implantable Lead Model: 5076
Implantable Lead Model: 511212
Implantable Lead Model: 511212
Implantable Lead Serial Number: 263773
Implantable Lead Serial Number: 263775
Implantable Pulse Generator Implant Date: 20180409
Lead Channel Impedance Value: 323 Ohm
Lead Channel Impedance Value: 3933 Ohm
Lead Channel Impedance Value: 399 Ohm
Lead Channel Impedance Value: 399 Ohm
Lead Channel Impedance Value: 4047 Ohm
Lead Channel Impedance Value: 817 Ohm
Lead Channel Pacing Threshold Amplitude: 0.625 V
Lead Channel Pacing Threshold Amplitude: 0.75 V
Lead Channel Pacing Threshold Amplitude: 1.75 V
Lead Channel Pacing Threshold Pulse Width: 0.4 ms
Lead Channel Pacing Threshold Pulse Width: 0.4 ms
Lead Channel Pacing Threshold Pulse Width: 1 ms
Lead Channel Sensing Intrinsic Amplitude: 13.875 mV
Lead Channel Sensing Intrinsic Amplitude: 13.875 mV
Lead Channel Sensing Intrinsic Amplitude: 3.25 mV
Lead Channel Sensing Intrinsic Amplitude: 3.25 mV
Lead Channel Setting Pacing Amplitude: 2.5 V
Lead Channel Setting Pacing Pulse Width: 0.4 ms
Lead Channel Setting Sensing Sensitivity: 0.6 mV

## 2020-02-03 ENCOUNTER — Ambulatory Visit (HOSPITAL_COMMUNITY): Payer: Self-pay | Admitting: Pharmacist

## 2020-02-03 LAB — POCT INR: INR: 2.2 (ref 2.0–3.0)

## 2020-02-03 NOTE — Progress Notes (Signed)
LVAD INR 

## 2020-02-03 NOTE — Progress Notes (Signed)
Remote ICD transmission.   

## 2020-02-10 ENCOUNTER — Ambulatory Visit (HOSPITAL_COMMUNITY): Payer: Self-pay | Admitting: Pharmacist

## 2020-02-10 LAB — POCT INR: INR: 2.1 (ref 2.0–3.0)

## 2020-02-10 NOTE — Progress Notes (Signed)
LVAD INR 

## 2020-02-17 ENCOUNTER — Ambulatory Visit (HOSPITAL_COMMUNITY): Payer: Self-pay | Admitting: Pharmacist

## 2020-02-17 LAB — POCT INR: INR: 2.6 (ref 2.0–3.0)

## 2020-02-17 NOTE — Progress Notes (Signed)
LVAD INR 

## 2020-02-24 ENCOUNTER — Ambulatory Visit (HOSPITAL_COMMUNITY): Payer: Self-pay | Admitting: Pharmacist

## 2020-02-24 LAB — POCT INR: INR: 2.6 (ref 2.0–3.0)

## 2020-02-24 NOTE — Progress Notes (Signed)
LVAD INR 

## 2020-03-02 ENCOUNTER — Ambulatory Visit (HOSPITAL_COMMUNITY): Payer: Self-pay | Admitting: Pharmacist

## 2020-03-02 LAB — POCT INR: INR: 2.5 (ref 2.0–3.0)

## 2020-03-02 NOTE — Progress Notes (Signed)
LVAD INR 

## 2020-03-09 ENCOUNTER — Ambulatory Visit (HOSPITAL_COMMUNITY): Payer: Self-pay | Admitting: Pharmacist

## 2020-03-09 LAB — POCT INR: INR: 2.5 (ref 2.0–3.0)

## 2020-03-09 NOTE — Progress Notes (Signed)
LVAD INR 

## 2020-03-13 ENCOUNTER — Other Ambulatory Visit (HOSPITAL_COMMUNITY): Payer: Self-pay | Admitting: *Deleted

## 2020-03-13 DIAGNOSIS — Z95811 Presence of heart assist device: Secondary | ICD-10-CM

## 2020-03-13 DIAGNOSIS — Z7901 Long term (current) use of anticoagulants: Secondary | ICD-10-CM

## 2020-03-13 DIAGNOSIS — Z79899 Other long term (current) drug therapy: Secondary | ICD-10-CM

## 2020-03-16 ENCOUNTER — Ambulatory Visit (HOSPITAL_COMMUNITY): Payer: Self-pay | Admitting: Pharmacist

## 2020-03-16 ENCOUNTER — Encounter (HOSPITAL_COMMUNITY): Payer: Medicare HMO

## 2020-03-16 LAB — POCT INR: INR: 2 (ref 2.0–3.0)

## 2020-03-16 NOTE — Progress Notes (Signed)
LVAD INR 

## 2020-03-23 ENCOUNTER — Ambulatory Visit (HOSPITAL_COMMUNITY): Payer: Self-pay | Admitting: Pharmacist

## 2020-03-23 LAB — POCT INR: INR: 1.4 — AB (ref 2.0–3.0)

## 2020-03-23 NOTE — Progress Notes (Signed)
LVAD INR 

## 2020-03-24 ENCOUNTER — Other Ambulatory Visit: Payer: Self-pay

## 2020-03-24 ENCOUNTER — Ambulatory Visit (HOSPITAL_COMMUNITY)
Admission: RE | Admit: 2020-03-24 | Discharge: 2020-03-24 | Disposition: A | Discharge status: Home / Self Care | Payer: Medicare HMO | Source: Ambulatory Visit | Attending: Cardiology | Admitting: Cardiology

## 2020-03-24 ENCOUNTER — Encounter (HOSPITAL_COMMUNITY): Payer: Self-pay

## 2020-03-24 VITALS — BP 106/95 | HR 76 | Wt 179.2 lb

## 2020-03-24 DIAGNOSIS — I10 Essential (primary) hypertension: Secondary | ICD-10-CM

## 2020-03-24 DIAGNOSIS — Z955 Presence of coronary angioplasty implant and graft: Secondary | ICD-10-CM | POA: Insufficient documentation

## 2020-03-24 DIAGNOSIS — I255 Ischemic cardiomyopathy: Secondary | ICD-10-CM | POA: Diagnosis not present

## 2020-03-24 DIAGNOSIS — I2510 Atherosclerotic heart disease of native coronary artery without angina pectoris: Secondary | ICD-10-CM | POA: Diagnosis not present

## 2020-03-24 DIAGNOSIS — N179 Acute kidney failure, unspecified: Secondary | ICD-10-CM | POA: Diagnosis not present

## 2020-03-24 DIAGNOSIS — D649 Anemia, unspecified: Secondary | ICD-10-CM | POA: Diagnosis not present

## 2020-03-24 DIAGNOSIS — Z7901 Long term (current) use of anticoagulants: Secondary | ICD-10-CM | POA: Diagnosis not present

## 2020-03-24 DIAGNOSIS — N183 Chronic kidney disease, stage 3 unspecified: Secondary | ICD-10-CM | POA: Insufficient documentation

## 2020-03-24 DIAGNOSIS — I5022 Chronic systolic (congestive) heart failure: Secondary | ICD-10-CM | POA: Insufficient documentation

## 2020-03-24 DIAGNOSIS — E785 Hyperlipidemia, unspecified: Secondary | ICD-10-CM | POA: Diagnosis not present

## 2020-03-24 DIAGNOSIS — F419 Anxiety disorder, unspecified: Secondary | ICD-10-CM | POA: Diagnosis not present

## 2020-03-24 DIAGNOSIS — Z95810 Presence of automatic (implantable) cardiac defibrillator: Secondary | ICD-10-CM | POA: Insufficient documentation

## 2020-03-24 DIAGNOSIS — Z95811 Presence of heart assist device: Secondary | ICD-10-CM | POA: Diagnosis not present

## 2020-03-24 DIAGNOSIS — Z452 Encounter for adjustment and management of vascular access device: Secondary | ICD-10-CM | POA: Insufficient documentation

## 2020-03-24 DIAGNOSIS — I480 Paroxysmal atrial fibrillation: Secondary | ICD-10-CM | POA: Diagnosis not present

## 2020-03-24 DIAGNOSIS — I5043 Acute on chronic combined systolic (congestive) and diastolic (congestive) heart failure: Secondary | ICD-10-CM

## 2020-03-24 DIAGNOSIS — Z79899 Other long term (current) drug therapy: Secondary | ICD-10-CM | POA: Insufficient documentation

## 2020-03-24 DIAGNOSIS — I130 Hypertensive heart and chronic kidney disease with heart failure and stage 1 through stage 4 chronic kidney disease, or unspecified chronic kidney disease: Secondary | ICD-10-CM | POA: Diagnosis present

## 2020-03-24 DIAGNOSIS — I4891 Unspecified atrial fibrillation: Secondary | ICD-10-CM | POA: Diagnosis not present

## 2020-03-24 LAB — LACTATE DEHYDROGENASE: LDH: 185 U/L (ref 98–192)

## 2020-03-24 LAB — COMPREHENSIVE METABOLIC PANEL
ALT: 24 U/L (ref 0–44)
AST: 25 U/L (ref 15–41)
Albumin: 3.6 g/dL (ref 3.5–5.0)
Alkaline Phosphatase: 109 U/L (ref 38–126)
Anion gap: 12 (ref 5–15)
BUN: 14 mg/dL (ref 6–20)
CO2: 26 mmol/L (ref 22–32)
Calcium: 9.4 mg/dL (ref 8.9–10.3)
Chloride: 101 mmol/L (ref 98–111)
Creatinine, Ser: 1.67 mg/dL — ABNORMAL HIGH (ref 0.61–1.24)
GFR, Estimated: 48 mL/min — ABNORMAL LOW (ref >60)
Glucose, Bld: 91 mg/dL (ref 70–99)
Potassium: 4.1 mmol/L (ref 3.5–5.1)
Sodium: 139 mmol/L (ref 135–145)
Total Bilirubin: 0.8 mg/dL (ref 0.3–1.2)
Total Protein: 8.1 g/dL (ref 6.5–8.1)

## 2020-03-24 LAB — CBC
HCT: 52.3 % — ABNORMAL HIGH (ref 39.0–52.0)
Hemoglobin: 16.4 g/dL (ref 13.0–17.0)
MCH: 26.5 pg (ref 26.0–34.0)
MCHC: 31.4 g/dL (ref 30.0–36.0)
MCV: 84.4 fL (ref 80.0–100.0)
Platelets: 200 10*3/uL (ref 150–400)
RBC: 6.2 MIL/uL — ABNORMAL HIGH (ref 4.22–5.81)
RDW: 14.1 % (ref 11.5–15.5)
WBC: 8.1 10*3/uL (ref 4.0–10.5)
nRBC: 0 % (ref 0.0–0.2)

## 2020-03-24 LAB — MAGNESIUM: Magnesium: 1.9 mg/dL (ref 1.7–2.4)

## 2020-03-24 LAB — PROTIME-INR
INR: 2 — ABNORMAL HIGH (ref 0.8–1.2)
Prothrombin Time: 22 seconds — ABNORMAL HIGH (ref 11.4–15.2)

## 2020-03-24 LAB — PREALBUMIN: Prealbumin: 32.7 mg/dL (ref 18–38)

## 2020-03-24 LAB — TSH: TSH: 2.464 u[IU]/mL (ref 0.350–4.500)

## 2020-03-24 MED ORDER — POTASSIUM CHLORIDE CRYS ER 20 MEQ PO TBCR: 20.0000 meq | EXTENDED_RELEASE_TABLET | Freq: Every day | ORAL | 3 refills | Status: DC

## 2020-03-24 NOTE — Patient Instructions (Signed)
1. No medication changes today 2. Coumadin dosing per Ander Purpura PharmD 3. Return to [facility] clinic in 2 months for follow up visit. Please bring all your equipment (batteries, mobile power unit (MPU), and Charity fundraiser with cords)

## 2020-03-24 NOTE — Progress Notes (Addendum)
Patient presents for 2 month follow up with 3 year Intermacs in [facility] today with his wife. Reports no problems with VAD equipment or concerns with drive line.    Pt states that he has been doing well since his last visit. He states that he is feeling great and has a lot of energy. Enjoyed Thanksgiving with his family. Of note, he states he experiences shortness of breath "when overdoing it carrying heavy stuff" but otherwise denies. Denies lightheadedness, dizziness, and falls.   Today is patient's 3 year Intermacs visit. Intermacs completed as below. Pt forgot to bring equipment for annual maintenance. Instructed to bring all equipment to his next clinic appointment. Patient provided with 1 box of batteries today; will give 2nd box at next clinic visit.     Vital Signs:  Doppler Pressure: 110 Automatc BP: 106/95 (101) HR: 76 SR  SPO2: 99% RA  Weight: 179.2 lb w/o eqt Last weight: 180.6 lb Home weights: 168 - 169 lbs  VAD Indication: Destination therapy- CKD  VAD interrogation & Equipment Management: Speed: 5400 Flow: 4.1 Power: 4.1 w    PI: 5.1  Alarms: few low voltage Events: 10-20 PI events daily  Fixed speed 5400 Low speed limit: 5100  Primary Controller:  Replace back up battery in 21 months.  Back up controller:  Replace back up battery in 23 months.--did not bring to clinic today  Annual Equipment Maintenance on UBC/PM was performed on 05/2018.   I reviewed the LVAD parameters from today and compared the results to the patient's prior recorded data. LVAD interrogation was NEGATIVE for significant power changes, negative for clinical alarms and STABLE for PI events/speed drops. No programming changes were made and pump is functioning within specified parameters. Pt is performing daily controller and system monitor self tests along with completing weekly and monthly maintenance for LVAD equipment.  LVAD equipment check completed and is in good working order.  Back up equipment present.    Exit Site Care: Drive line is being maintained weekly by [name], his wife. Exit site healed and incorporated, the velour is fully implanted at exit site. No redness, drainage or foul odor noted. Pt denies fever or chills. Pt given 8 weekly dressings and 8 anchors at this visit.   Significant Events on VAD Support:  none  Device:Medtronic Bi-V Therapies: on Last check: 10/30/2019   BP & Labs:  Doppler BP 110 - Doppler is reflecting modified systolic.  Hgb 16.4 - No S/S of bleeding. Specifically denies melena/BRBPR or nosebleeds.  LDH stable at 185with established baseline of 165- 250. Denies tea-colored urine. No power elevations noted on interrogation.    3 year Intermacs follow up completed including:  Quality of Life, KCCQ-12, and Neurocognitive trail making.   Pt completed 1400 feet during 6 minute walk.  [facility] Cardiomyopathy Questionnaire  KCCQ-12 11/13/2019  1 a. Ability to shower/bathe Not at all limited  1 b. Ability to walk 1 block Not at all limited  1 c. Ability to hurry/jog Not at all limited  2. Edema feet/ankles/legs Never over the past 2 weeks  3. Limited by fatigue Never over the past 2 weeks  4. Limited by dyspnea Never over the past 2 weeks  5. Sitting up / on 3+ pillows Never over the past 2 weeks  6. Limited enjoyment of life Slightly limited  7. Rest of life w/ symptoms Mostly satisfied  8 a. Participation in hobbies Slightly limited  8 b. Participation in chores Slightly limited  8  c. Visiting family/friends Did not limit at all    Back up controller: Not present  Patient Instructions:  1. No medication changes today 2. Coumadin dosing per Ander Purpura PharmD 3. Return to [place] [facility] in 2 months for follow up visit. Please bring all your equipment (batteries, mobile power unit (MPU), and Charity fundraiser with cords)   Emerson Monte RN [place] Coordinator  Office: [phone]  24/7 Pager: [phone]

## 2020-03-25 ENCOUNTER — Encounter (HOSPITAL_COMMUNITY): Payer: Self-pay | Admitting: *Deleted

## 2020-03-25 LAB — T4: T4, Total: 11.7 ug/dL (ref 4.5–12.0)

## 2020-03-29 NOTE — Progress Notes (Signed)
LVAD Clinic Follow Up Note HF MD: Dr Haroldine Laws  HPI: David [name] is a 54 y.o. male with history of ICD (09 PCI to LAD, mRCA, '10 PCI to Lcx and '12 mRCA), HFrEF (EF ~20% for >5 years), HLD and HTN. S/P LVAD HM-III 04/20/18.   Admitted 04/19/17 through 05/27/2017 for scheduled LVAD implant. On 12/27 he underwent HMIII LVAD implant. Hospital course complicated by PNA with achromobacter tracheal aspirate, shock liver, and AKI. Required short term dialysis.  Readmitted 05/30/17 with symptomatic anemia hgb 3.8.  INR 2.9. He was transfused with 4 units of packed red blood cell. 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  It was suspected AVMs were the likely cause of bleeding in the setting of anticoagulation.  He will need colonoscopy at some point, this can be done as outpatient. Discharged 06/04/17.  Underwent DC-CV for AFL on 10/17/18   Follow up for Heart Failure/LVAD: Returns for routine VAD f/u with his wife. Feels great. Does all activities without difficulty. At last visit lasix cut back to qOD due to AKI. Denies orthopnea or PND. No fevers, chills or problems with driveline. No bleeding, melena or neuro symptoms. No VAD alarms. Taking all meds as prescribed.    VAD Indication: Destination therapy- CKD  VAD interrogation & Equipment Management: Speed: 5400 Flow: 4.1 Power: 4.1 w PI: 5.1  Alarms: few low voltage Events: 10-20 PI events daily  Fixed speed 5400 Low speed limit: 5100  Primary Controller:  Replace back up battery in 21 months.  Back up controller: Replace back up battery in 37months.--did not bring to clinic today  Annual Equipment Maintenance on UBC/PM was performed on 05/2018.   Past Medical History:  Diagnosis Date  . AICD (automatic cardioverter/defibrillator) present   . Anemia   .  Anxiety   . CAD (coronary artery disease) 2009   AMI in 12/2007 with PCI to LAD, staged PCI to  mid/distal RCA, NSTEMI in 02/2009 with BMS to LCx  . CHF (congestive heart failure) ([place])   . Chronic kidney disease 11/03/2016   stage 3 kidney disease  . Dysrhythmia    "arrythmia problems at some point", "fatal rhythms"  . History of blood transfusion    "I was bleeding from was rectum" (04/19/2017)  . HLD (hyperlipidemia)   . HTN (hypertension)   . Ischemic cardiomyopathy    Admitted in 07/2010 with CHF exacerbation  . Myocardial infarction ([place])    "I've had 4" (04/19/2017)  . Pneumonia 2018 X 1  . Seizures ([place])    one seizure in 04/2016 during cardiac event (04/19/2017)    Current Outpatient Medications  Medication Sig Dispense Refill  . amiodarone (PACERONE) 200 MG tablet Take 0.5 tablets (100 mg total) by mouth daily. 30 tablet 6  . atorvastatin (LIPITOR) 40 MG tablet Take 1 tab daily 90 tablet 3  . furosemide (LASIX) 40 MG tablet Take 1 tablet (40 mg total) by mouth every other day. TAKE 1 TABLET BY MOUTH ONCE DAILY. (Patient taking differently: Take 40 mg by mouth every other day. ) 90 tablet 6  . gabapentin (NEURONTIN) 300 MG capsule Take 1 capsule (300 mg total) by mouth 2 (two) times daily. 60 capsule 6  . losartan (COZAAR) 50 MG tablet Take 1 tablet (50 mg total) by mouth daily. 90 tablet 3  . magnesium oxide (MAG-OX) 400 MG tablet Take 1 tablet (  400 mg total) by mouth 2 (two) times daily. 60 tablet 6  . pantoprazole (PROTONIX) 40 MG tablet Take 1 tablet (40 mg total) by mouth daily. 90 tablet 3  . potassium chloride SA (KLOR-CON M20) 20 MEQ tablet Take 1 tablet (20 mEq total) by mouth daily. KEEP OV. 90 tablet 3  . traZODone (DESYREL) 50 MG tablet TAKE 1 TABLET BY MOUTH ONCE DAILY AT BEDTIME 30 tablet 0  . warfarin (COUMADIN) 2 MG tablet Take 4 mg (2 tabs) every Monday/Friday and 2 mg (1 tab) all other days or as directed by HF clinic 60 tablet 11  . enoxaparin (LOVENOX) 40  MG/0.4ML injection Inject 0.4 mLs (40 mg total) into the skin every 12 (twelve) hours. (Patient not taking: Reported on 09/05/2019) 4 mL 0   No current facility-administered medications for this encounter.    Plavix [clopidogrel bisulfate]  Review of systems complete and found to be negative unless listed in HPI.     Vitals:   03/24/20 1020 03/24/20 1024  BP: (!) 110/0 (!) 106/95  Pulse: 76   SpO2: 99%    Wt Readings from Last 3 Encounters:  03/24/20 81.3 kg (179 lb 3.2 oz)  01/14/20 81.9 kg (180 lb 9.6 oz)  11/13/19 82.4 kg (181 lb 9.6 oz)   Vital Signs:  Doppler Pressure: 110 Automatc BP: 106/95 (101) HR: 76 SR  SPO2: 99% RA  Weight: 179.2 lb w/o eqt Last weight: 180.6 lb Home weights: 168 - 169 lbs  Physical Exam: General:  NAD.  HEENT: normal  Neck: supple. JVP not elevated.  Carotids 2+ bilat; no bruits. No lymphadenopathy or thryomegaly appreciated. Cor: LVAD hum.  Lungs: Clear. Abdomen: soft, nontender, non-distended. No hepatosplenomegaly. No bruits or masses. Good bowel sounds. Driveline site clean. Anchor in place.  Extremities: no cyanosis, clubbing, rash. Warm no edema  Neuro: alert & oriented x 3. No focal deficits. Moves all 4 without problem    ASSESSMENT AND PLAN:   1. Chronic systolic CHF with HM III LVAD implant 04/20/17:  - Continues to do very well with VAD support. - NYHA I. Volume status stable. Lasix dropped to qod at last visit due to AKI - He has been seen at [facility] for transplant referral but has decided not to pursue VAD for now. No change  2. Persistent AFL with RVR - had DC-CV on 10/17/18 with subsequent short episodes of AF after - No breakthrough AF on device interrogation today. . Continue amio 100 daily.     3. 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  - Hgb 16.4  4. CKD Stage III:  - Creatinine baseline 1.3 - 1.5.  - Creatinine  1.6 today  5. VAD management - VAD interrogated personally. Parameters stable. - LDH 185 - INR goal 2.0-2.5 Off ASA due to GIB 2/19.  - INR 2.0 Discussed dosing with PharmD personally. - MAP high today but has been in 70s. Will follow  6. CAD:  s/p PCI RCA/PLOM with DES x2 and DES to mild LAD 1/18 - no s/s angina  - Continue statin. Off ASA due to GIB  7. Hypomagnesemia. - Reminded him to take his mag supplements  Total time spent 35 minutes. Over half that time spent discussing above.     Glori Bickers, MD  10:46 PM

## 2020-03-29 NOTE — Addendum Note (Signed)
Encounter addended by: Jolaine Artist, MD on: 03/29/2020 10:47 PM  Actions taken: Pend clinical note

## 2020-03-30 ENCOUNTER — Ambulatory Visit (HOSPITAL_COMMUNITY): Payer: Self-pay | Admitting: Pharmacist

## 2020-03-30 LAB — POCT INR: INR: 3 (ref 2.0–3.0)

## 2020-03-30 NOTE — Progress Notes (Signed)
LVAD INR 

## 2020-03-30 NOTE — Addendum Note (Signed)
Encounter addended by: Jolaine Artist, MD on: 03/30/2020 10:23 PM  Actions taken: Clinical Note Signed, Level of Service modified, Visit diagnoses modified, Charge Capture section accepted

## 2020-04-06 ENCOUNTER — Ambulatory Visit (HOSPITAL_COMMUNITY): Payer: Self-pay | Admitting: Pharmacist

## 2020-04-06 LAB — POCT INR: INR: 1.9 — AB (ref 2.0–3.0)

## 2020-04-06 NOTE — Progress Notes (Signed)
LVAD INR 

## 2020-04-13 ENCOUNTER — Ambulatory Visit (HOSPITAL_COMMUNITY): Payer: Self-pay | Admitting: Pharmacist

## 2020-04-13 LAB — POCT INR: INR: 2.5 (ref 2.0–3.0)

## 2020-04-13 NOTE — Progress Notes (Signed)
LVAD INR 

## 2020-04-20 ENCOUNTER — Ambulatory Visit (HOSPITAL_COMMUNITY): Payer: Self-pay | Admitting: Pharmacist

## 2020-04-20 LAB — POCT INR: INR: 1.9 — AB (ref 2.0–3.0)

## 2020-04-20 NOTE — Progress Notes (Signed)
LVAD INR 

## 2020-04-27 ENCOUNTER — Ambulatory Visit (HOSPITAL_COMMUNITY): Payer: Self-pay | Admitting: Pharmacist

## 2020-04-27 LAB — POCT INR: INR: 1.8 — AB (ref 2.0–3.0)

## 2020-04-27 NOTE — Progress Notes (Signed)
LVAD INR 

## 2020-04-29 ENCOUNTER — Ambulatory Visit (INDEPENDENT_AMBULATORY_CARE_PROVIDER_SITE_OTHER): Payer: Medicare HMO

## 2020-04-29 DIAGNOSIS — I255 Ischemic cardiomyopathy: Secondary | ICD-10-CM

## 2020-04-29 LAB — CUP PACEART REMOTE DEVICE CHECK
Battery Remaining Longevity: 56 mo
Battery Voltage: 2.97 V
Brady Statistic AP VP Percent: 0 %
Brady Statistic AP VS Percent: 0 %
Brady Statistic AS VP Percent: 0.03 %
Brady Statistic AS VS Percent: 99.97 %
Brady Statistic RA Percent Paced: 0 %
Brady Statistic RV Percent Paced: 0.03 %
Date Time Interrogation Session: 20220105091806
HighPow Impedance: 73 Ohm
Implantable Lead Implant Date: 20180409
Implantable Lead Implant Date: 20180409
Implantable Lead Implant Date: 20180716
Implantable Lead Implant Date: 20180716
Implantable Lead Location: 753858
Implantable Lead Location: 753858
Implantable Lead Location: 753859
Implantable Lead Location: 753860
Implantable Lead Model: 5076
Implantable Lead Model: 511212
Implantable Lead Model: 511212
Implantable Lead Serial Number: 263773
Implantable Lead Serial Number: 263775
Implantable Pulse Generator Implant Date: 20180409
Lead Channel Impedance Value: 304 Ohm
Lead Channel Impedance Value: 380 Ohm
Lead Channel Impedance Value: 3800 Ohm
Lead Channel Impedance Value: 4047 Ohm
Lead Channel Impedance Value: 437 Ohm
Lead Channel Impedance Value: 836 Ohm
Lead Channel Pacing Threshold Amplitude: 0.625 V
Lead Channel Pacing Threshold Amplitude: 0.75 V
Lead Channel Pacing Threshold Amplitude: 1.75 V
Lead Channel Pacing Threshold Pulse Width: 0.4 ms
Lead Channel Pacing Threshold Pulse Width: 0.4 ms
Lead Channel Pacing Threshold Pulse Width: 1 ms
Lead Channel Sensing Intrinsic Amplitude: 14 mV
Lead Channel Sensing Intrinsic Amplitude: 14 mV
Lead Channel Sensing Intrinsic Amplitude: 3.625 mV
Lead Channel Sensing Intrinsic Amplitude: 3.625 mV
Lead Channel Setting Pacing Amplitude: 2.5 V
Lead Channel Setting Pacing Pulse Width: 0.4 ms
Lead Channel Setting Sensing Sensitivity: 0.6 mV

## 2020-05-04 ENCOUNTER — Ambulatory Visit (HOSPITAL_COMMUNITY): Payer: Self-pay | Admitting: Pharmacist

## 2020-05-04 LAB — POCT INR: INR: 1.9 — AB (ref 2.0–3.0)

## 2020-05-04 NOTE — Progress Notes (Signed)
LVAD INR 

## 2020-05-11 LAB — POCT INR: INR: 2.5 (ref 2.0–3.0)

## 2020-05-12 ENCOUNTER — Ambulatory Visit (HOSPITAL_COMMUNITY): Payer: Self-pay | Admitting: Pharmacist

## 2020-05-12 NOTE — Progress Notes (Signed)
LVAD INR 

## 2020-05-13 NOTE — Progress Notes (Signed)
Remote ICD transmission.   

## 2020-05-18 ENCOUNTER — Ambulatory Visit (HOSPITAL_COMMUNITY): Payer: Self-pay | Admitting: Pharmacist

## 2020-05-18 LAB — POCT INR: INR: 2.3 (ref 2.0–3.0)

## 2020-05-18 NOTE — Progress Notes (Signed)
LVAD INR 

## 2020-05-21 ENCOUNTER — Other Ambulatory Visit (HOSPITAL_COMMUNITY): Payer: Self-pay | Admitting: *Deleted

## 2020-05-21 DIAGNOSIS — I472 Ventricular tachycardia, unspecified: Secondary | ICD-10-CM

## 2020-05-21 DIAGNOSIS — Z95811 Presence of heart assist device: Secondary | ICD-10-CM

## 2020-05-21 DIAGNOSIS — I5043 Acute on chronic combined systolic (congestive) and diastolic (congestive) heart failure: Secondary | ICD-10-CM

## 2020-05-21 DIAGNOSIS — Z7901 Long term (current) use of anticoagulants: Secondary | ICD-10-CM

## 2020-05-25 ENCOUNTER — Encounter (HOSPITAL_COMMUNITY): Payer: Medicare HMO

## 2020-05-25 ENCOUNTER — Ambulatory Visit (HOSPITAL_COMMUNITY): Payer: Self-pay | Admitting: Pharmacist

## 2020-05-25 LAB — POCT INR: INR: 2.3 (ref 2.0–3.0)

## 2020-05-25 NOTE — Progress Notes (Signed)
LVAD INR 

## 2020-06-01 ENCOUNTER — Ambulatory Visit (HOSPITAL_COMMUNITY): Payer: Self-pay | Admitting: Pharmacist

## 2020-06-01 LAB — POCT INR: INR: 2.4 (ref 2.0–3.0)

## 2020-06-01 NOTE — Progress Notes (Signed)
LVAD INR 

## 2020-06-05 ENCOUNTER — Other Ambulatory Visit (HOSPITAL_COMMUNITY): Payer: Self-pay | Admitting: Unknown Physician Specialty

## 2020-06-05 DIAGNOSIS — Z95811 Presence of heart assist device: Secondary | ICD-10-CM

## 2020-06-05 DIAGNOSIS — Z7901 Long term (current) use of anticoagulants: Secondary | ICD-10-CM

## 2020-06-08 ENCOUNTER — Encounter (HOSPITAL_COMMUNITY): Payer: Self-pay

## 2020-06-08 ENCOUNTER — Other Ambulatory Visit: Payer: Self-pay

## 2020-06-08 ENCOUNTER — Ambulatory Visit (HOSPITAL_COMMUNITY): Payer: Self-pay | Admitting: Pharmacist

## 2020-06-08 ENCOUNTER — Ambulatory Visit (HOSPITAL_COMMUNITY)
Admission: RE | Admit: 2020-06-08 | Discharge: 2020-06-08 | Disposition: A | Discharge status: Home / Self Care | Payer: Medicare HMO | Source: Ambulatory Visit | Attending: Internal Medicine | Admitting: Internal Medicine

## 2020-06-08 VITALS — BP 94/58 | HR 92 | Temp 98.0°F | Ht 65.0 in | Wt 132.4 lb

## 2020-06-08 DIAGNOSIS — Z955 Presence of coronary angioplasty implant and graft: Secondary | ICD-10-CM | POA: Insufficient documentation

## 2020-06-08 DIAGNOSIS — I5022 Chronic systolic (congestive) heart failure: Secondary | ICD-10-CM | POA: Diagnosis not present

## 2020-06-08 DIAGNOSIS — Z95811 Presence of heart assist device: Secondary | ICD-10-CM | POA: Diagnosis not present

## 2020-06-08 DIAGNOSIS — Z7901 Long term (current) use of anticoagulants: Secondary | ICD-10-CM

## 2020-06-08 DIAGNOSIS — I2510 Atherosclerotic heart disease of native coronary artery without angina pectoris: Secondary | ICD-10-CM

## 2020-06-08 DIAGNOSIS — N1831 Chronic kidney disease, stage 3a: Secondary | ICD-10-CM | POA: Diagnosis not present

## 2020-06-08 DIAGNOSIS — Z79899 Other long term (current) drug therapy: Secondary | ICD-10-CM | POA: Insufficient documentation

## 2020-06-08 DIAGNOSIS — I480 Paroxysmal atrial fibrillation: Secondary | ICD-10-CM

## 2020-06-08 DIAGNOSIS — E785 Hyperlipidemia, unspecified: Secondary | ICD-10-CM | POA: Diagnosis not present

## 2020-06-08 DIAGNOSIS — I132 Hypertensive heart and chronic kidney disease with heart failure and with stage 5 chronic kidney disease, or end stage renal disease: Secondary | ICD-10-CM | POA: Insufficient documentation

## 2020-06-08 DIAGNOSIS — Z95810 Presence of automatic (implantable) cardiac defibrillator: Secondary | ICD-10-CM | POA: Diagnosis not present

## 2020-06-08 DIAGNOSIS — I10 Essential (primary) hypertension: Secondary | ICD-10-CM

## 2020-06-08 LAB — CBC
HCT: 52.3 % — ABNORMAL HIGH (ref 39.0–52.0)
Hemoglobin: 16.6 g/dL (ref 13.0–17.0)
MCH: 26.4 pg (ref 26.0–34.0)
MCHC: 31.7 g/dL (ref 30.0–36.0)
MCV: 83.1 fL (ref 80.0–100.0)
Platelets: 180 10*3/uL (ref 150–400)
RBC: 6.29 MIL/uL — ABNORMAL HIGH (ref 4.22–5.81)
RDW: 14.3 % (ref 11.5–15.5)
WBC: 9.5 10*3/uL (ref 4.0–10.5)
nRBC: 0 % (ref 0.0–0.2)

## 2020-06-08 LAB — BASIC METABOLIC PANEL
Anion gap: 9 (ref 5–15)
BUN: 15 mg/dL (ref 6–20)
CO2: 25 mmol/L (ref 22–32)
Calcium: 9.5 mg/dL (ref 8.9–10.3)
Chloride: 104 mmol/L (ref 98–111)
Creatinine, Ser: 1.65 mg/dL — ABNORMAL HIGH (ref 0.61–1.24)
GFR, Estimated: 48 mL/min — ABNORMAL LOW (ref >60)
Glucose, Bld: 108 mg/dL — ABNORMAL HIGH (ref 70–99)
Potassium: 4.2 mmol/L (ref 3.5–5.1)
Sodium: 138 mmol/L (ref 135–145)

## 2020-06-08 LAB — POCT INR: INR: 1.8 — AB (ref 2.0–3.0)

## 2020-06-08 LAB — PROTIME-INR
INR: 1.8 — ABNORMAL HIGH (ref 0.8–1.2)
Prothrombin Time: 20.4 seconds — ABNORMAL HIGH (ref 11.4–15.2)

## 2020-06-08 LAB — MAGNESIUM: Magnesium: 1.7 mg/dL (ref 1.7–2.4)

## 2020-06-08 LAB — LACTATE DEHYDROGENASE: LDH: 209 U/L — ABNORMAL HIGH (ref 98–192)

## 2020-06-08 NOTE — Progress Notes (Signed)
Patient presents for 2 month follow up and annual maintenance for home VAD equipment in [facility] today with [name]) wife. Reports no problems or concerns with drive line.  Patient says he has been feeling great. He does report increased appetite over holidays with some weight gain.   He says he has no physical limitations at this time.   Patient and wife report his controller is "beeping" at intervals when patient changes position. It has occurred while patient on batteries and MPU. Large open tear on controller white power cord. Primary controller replaced with: [id number].  Pt performed controller change out on himself without issue. He did afterward report he felt very anxious afterwards, saying he didn't think he could ever do this again. Reinforced it would only be done if absolutely necessary and only after he has paged VAD Coordinator and instructed to perform under Coordinator supervision. He verbalized agreement to same.   Wife brought [place] form to be completed. Forms completed, signed by Dr. Haroldine Laws, and faxed to [facility] [phone] w/confirmation).  Vital Signs:  Temp:  98.0 Doppler Pressure: 60 Automatc BP:  94/58 (69) HR: 92 SR  SPO2: 96% RA  Weight: 182.4 lb w/o eqt Last weight: 179.2lb  VAD Indication: Destination therapy- CKD  VAD interrogation & Equipment Management: Speed: 5400 Flow: 4.8 Power: 4.2w    PI: 2.0  Alarms: few low voltage Events: 20 - 30 PI events daily  Fixed speed 5400 Low speed limit: 5100  New Primary Controller:  Replace back up battery in 31 months  Back up controller:  Replace back up battery in 18 months  Annual Equipment Maintenance on UBC/PM was performed today on 06/08/20.   I reviewed the LVAD parameters from today and compared the results to the patient's prior recorded data. LVAD interrogation was NEGATIVE for significant power changes, negative for clinical alarms and STABLE for PI  events/speed drops. No programming changes were made and pump is functioning within specified parameters. Pt is performing daily controller and system monitor self tests along with completing weekly and monthly maintenance for LVAD equipment.  LVAD equipment check completed and is in good working order. Back up equipment present.    Exit Site Care: Drive line is being maintained weekly by [name], his wife. Exit site healed and incorporated, the velour is fully implanted at exit site. No redness, drainage or foul odor noted. Pt denies fever or chills. Pt given 8 weekly dressings and 8 anchors at this visit.    Device:Medtronic Bi-V Therapies: on Last check: 04/29/20   BP & Labs:  Doppler BP 60 - Doppler is reflecting MAP  Hgb 16.6  - No S/S of bleeding. Specifically denies melena/BRBPR or nosebleeds.  LDH stable at 209 with established baseline of 165- 250. Denies tea-colored urine. No power elevations noted on interrogation.    Annual maintenance completed per [employer] on patient's home universal [employer] and batteries replaced in home MPU unit.   Backup system controller 11 volt battery charged during visit.   Patient Instructions:  1. No change in medications. 2. Return to [facility] in 2 months.   Zada Girt, RN VAD Coordinator  Office: [phone]  24/7 Pager: [phone]

## 2020-06-08 NOTE — Patient Instructions (Signed)
1. No change in medications 2. Return to VAD Clinic in 2 months 

## 2020-06-08 NOTE — Progress Notes (Signed)
LVAD INR 

## 2020-06-09 NOTE — Progress Notes (Signed)
LVAD Clinic Follow Up Note HF MD: Dr Haroldine Laws  HPI: David [name] is a 55 y.o. male with history of ICD (09 PCI to LAD, mRCA, '10 PCI to Lcx and '12 mRCA), HFrEF (EF ~20% for >5 years), HLD and HTN. S/P LVAD HM-III 04/20/18.   Admitted 04/19/17 through 05/27/2017 for scheduled LVAD implant. On 12/27 he underwent HMIII LVAD implant. Hospital course complicated by PNA with achromobacter tracheal aspirate, shock liver, and AKI. Required short term dialysis.  Readmitted 05/30/17 with symptomatic anemia hgb 3.8.  INR 2.9. He was transfused with 4 units of packed red blood cell. 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  It was suspected AVMs were the likely cause of bleeding in the setting of anticoagulation.  He will need colonoscopy at some point, this can be done as outpatient. Discharged 06/04/17.  Underwent DC-CV for AFL on 10/17/18   Follow up for Heart Failure/LVAD: Returns for routine VAD f/u with his wife. Continues to do very well. Feels great. Denies CP, SOB. No edema. Having some beeping from his controller and had tear in power cord. Denies orthopnea or PND. No fevers, chills or problems with driveline. No bleeding, melena or neuro symptoms. No VAD alarms. Taking all meds as prescribed.   VAD Indication: Destination therapy- CKD  VAD interrogation & Equipment Management: Speed: 5400 Flow: 4.8 Power: 4.2w PI: 2.0  Alarms: few low voltage Events: 20 - 30 PI events daily  Fixed speed 5400 Low speed limit: 5100  New Primary Controller:  Replace back up battery in 31 months  Back up controller: Replace back up battery in 18 months  Annual Equipment Maintenance on UBC/PM was performed today on 06/08/20.    Past Medical History:  Diagnosis Date  . AICD (automatic cardioverter/defibrillator) present   .  Anemia   . Anxiety   . CAD (coronary artery disease) 2009   AMI in 12/2007 with PCI to LAD, staged PCI to  mid/distal RCA, NSTEMI in 02/2009 with BMS to LCx  . CHF (congestive heart failure) ([place])   . Chronic kidney disease 11/03/2016   stage 3 kidney disease  . Dysrhythmia    "arrythmia problems at some point", "fatal rhythms"  . History of blood transfusion    "I was bleeding from was rectum" (04/19/2017)  . HLD (hyperlipidemia)   . HTN (hypertension)   . Ischemic cardiomyopathy    Admitted in 07/2010 with CHF exacerbation  . Myocardial infarction ([place])    "I've had 4" (04/19/2017)  . Pneumonia 2018 X 1  . Seizures ([place])    one seizure in 04/2016 during cardiac event (04/19/2017)    Current Outpatient Medications  Medication Sig Dispense Refill  . amiodarone (PACERONE) 200 MG tablet Take 0.5 tablets (100 mg total) by mouth daily. 30 tablet 6  . atorvastatin (LIPITOR) 40 MG tablet Take 1 tab daily 90 tablet 3  . furosemide (LASIX) 40 MG tablet Take 1 tablet (40 mg total) by mouth every other day. TAKE 1 TABLET BY MOUTH ONCE DAILY. (Patient taking differently: Take 40 mg by mouth every other day.) 90 tablet 6  . gabapentin (NEURONTIN) 300 MG capsule Take 1 capsule (300 mg total) by mouth 2 (two) times daily. 60 capsule 6  . losartan (COZAAR) 50 MG tablet Take 1 tablet (50 mg total) by mouth daily. 90 tablet 3  . magnesium oxide (MAG-OX) 400 MG  tablet Take 1 tablet (400 mg total) by mouth 2 (two) times daily. 60 tablet 6  . pantoprazole (PROTONIX) 40 MG tablet Take 1 tablet (40 mg total) by mouth daily. 90 tablet 3  . potassium chloride SA (KLOR-CON M20) 20 MEQ tablet Take 1 tablet (20 mEq total) by mouth daily. KEEP OV. 90 tablet 3  . traZODone (DESYREL) 50 MG tablet TAKE 1 TABLET BY MOUTH ONCE DAILY AT BEDTIME 30 tablet 0  . warfarin (COUMADIN) 2 MG tablet Take 4 mg (2 tabs) every Monday/Friday and 2 mg (1 tab) all other days or as directed by HF clinic 60 tablet 11   No current  facility-administered medications for this encounter.    Plavix [clopidogrel bisulfate]  Review of systems complete and found to be negative unless listed in HPI.     Vitals:   06/08/20 1052 06/08/20 1053  BP: (!) 60/0 (!) 94/58  Pulse:  92  Temp:  98 F (36.7 C)  SpO2:  96%   Wt Readings from Last 3 Encounters:  06/08/20 60.1 kg (132 lb 6.4 oz)  03/24/20 81.3 kg (179 lb 3.2 oz)  01/14/20 81.9 kg (180 lb 9.6 oz)   Vital Signs:  Temp:  98.0 Doppler Pressure: 60 Automatc BP:  94/58 (69) HR: 92 SR  SPO2: 96% RA  Weight: 182.4 lb w/o eqt Last weight: 179.2lb  Physical Exam: General:  NAD.  HEENT: normal  Neck: supple. JVP not elevated.  Carotids 2+ bilat; no bruits. No lymphadenopathy or thryomegaly appreciated. Cor: LVAD hum.  Lungs: Clear. Abdomen: obese soft, nontender, non-distended. No hepatosplenomegaly. No bruits or masses. Good bowel sounds. Driveline site clean. Anchor in place.  Extremities: no cyanosis, clubbing, rash. Warm no edema  Neuro: alert & oriented x 3. No focal deficits. Moves all 4 without problem     ASSESSMENT AND PLAN:   1. Chronic systolic CHF with HM III LVAD implant 04/20/17:  - Continues to do very well with VAD support. - NYHA I. Volume status looks good despite weight gain (eating too much)   - He has been seen at [facility] for transplant referral but has decided not to pursue VAD for now. No change  2. Persistent AFL with RVR - had DC-CV on 10/17/18 with subsequent short episodes of AF after - No breakthrough AF. Remains in NSR on monitor today. Continue amio 100 daily - check amio labs    3. h/o UGI AVM bleed - s/p APC x 4 lesions in 2/19 - No recurrent bleeding events - Continue PPI - Off ASA due to GIB.  - Hgb 16.6  4. CKD Stage IIIa:  - Creatinine baseline 1.3 - 1.g  - Creatinine stable at 1.65 today  5. VAD management - VAD interrogated personally. Parameters stable. - LDH 201 - INR goal 2.0-2.5 Off ASA due to GIB  2/19.  - INR 1.8 Discussed dosing with PharmD personally. - Blood pressure well controlled. Continue current regimen. - Had large tear in power cord to controller. Controller change performed in clinic. Reinforced need for caution with equipment   6. CAD:  s/p PCI RCA/PLOM with DES x2 and DES to mild LAD 1/18 - no s/s - Continue statin. Off ASA due to GIB  7. Hypomagnesemia. - Reminded him to take his mag supplements - no change  Total time spent 45 minutes. Over half that time spent discussing above.    Glori Bickers, MD  1:02 PM

## 2020-06-15 ENCOUNTER — Ambulatory Visit (HOSPITAL_COMMUNITY): Payer: Self-pay | Admitting: Pharmacist

## 2020-06-15 LAB — POCT INR: INR: 2.6 (ref 2.0–3.0)

## 2020-06-15 NOTE — Progress Notes (Signed)
LVAD INR 

## 2020-06-22 ENCOUNTER — Ambulatory Visit (HOSPITAL_COMMUNITY): Payer: Self-pay | Admitting: Pharmacist

## 2020-06-22 LAB — POCT INR: INR: 2.7 (ref 2.0–3.0)

## 2020-06-22 NOTE — Progress Notes (Signed)
LVAD INR 

## 2020-06-24 ENCOUNTER — Telehealth (HOSPITAL_COMMUNITY): Payer: Self-pay | Admitting: Pharmacist

## 2020-06-24 DIAGNOSIS — I5020 Unspecified systolic (congestive) heart failure: Secondary | ICD-10-CM

## 2020-06-24 DIAGNOSIS — Z95811 Presence of heart assist device: Secondary | ICD-10-CM

## 2020-06-24 DIAGNOSIS — I10 Essential (primary) hypertension: Secondary | ICD-10-CM

## 2020-06-24 DIAGNOSIS — I5043 Acute on chronic combined systolic (congestive) and diastolic (congestive) heart failure: Secondary | ICD-10-CM

## 2020-06-24 NOTE — Telephone Encounter (Signed)
Received message from patient's wife that a refill was needed on losartan. Per Epic, it looks like a refill was sent to [facility] on 06/23/20 from our office. However, the pharmacy never received the prescription. I provided [facility] with verbal order for losartan 50 mg daily, quantity 90, refill 3. Patient's wife expressed understanding.  Audry Riles, PharmD, BCPS, BCCP, CPP Heart Failure Clinic Pharmacist [phone]

## 2020-06-29 ENCOUNTER — Ambulatory Visit (HOSPITAL_COMMUNITY): Payer: Self-pay | Admitting: Pharmacist

## 2020-06-29 LAB — POCT INR: INR: 2 (ref 2.0–3.0)

## 2020-06-29 NOTE — Progress Notes (Signed)
LVAD INR 

## 2020-07-06 ENCOUNTER — Ambulatory Visit (HOSPITAL_COMMUNITY): Payer: Self-pay | Admitting: Pharmacist

## 2020-07-06 LAB — POCT INR: INR: 2 (ref 2.0–3.0)

## 2020-07-06 NOTE — Progress Notes (Signed)
LVAD INR 

## 2020-07-13 ENCOUNTER — Ambulatory Visit (HOSPITAL_COMMUNITY): Payer: Self-pay | Admitting: Pharmacist

## 2020-07-13 LAB — POCT INR: INR: 2.3 (ref 2.0–3.0)

## 2020-07-13 NOTE — Progress Notes (Signed)
LVAD INR 

## 2020-07-15 ENCOUNTER — Other Ambulatory Visit (HOSPITAL_COMMUNITY): Payer: Self-pay | Admitting: Internal Medicine

## 2020-07-15 ENCOUNTER — Other Ambulatory Visit (HOSPITAL_COMMUNITY): Payer: Self-pay | Admitting: *Deleted

## 2020-07-15 DIAGNOSIS — Z95811 Presence of heart assist device: Secondary | ICD-10-CM

## 2020-07-15 DIAGNOSIS — I4892 Unspecified atrial flutter: Secondary | ICD-10-CM

## 2020-07-15 MED ORDER — AMIODARONE HCL 200 MG PO TABS: 100.0000 mg | ORAL_TABLET | Freq: Every day | ORAL | 5 refills | Status: DC

## 2020-07-15 MED ORDER — PANTOPRAZOLE SODIUM 40 MG PO TBEC: 40.0000 mg | DELAYED_RELEASE_TABLET | Freq: Every day | ORAL | 3 refills | Status: DC

## 2020-07-20 ENCOUNTER — Ambulatory Visit (HOSPITAL_COMMUNITY): Payer: Self-pay | Admitting: Pharmacist

## 2020-07-20 LAB — POCT INR: INR: 2.3 (ref 2.0–3.0)

## 2020-07-20 NOTE — Progress Notes (Signed)
LVAD INR 

## 2020-07-23 ENCOUNTER — Other Ambulatory Visit (HOSPITAL_COMMUNITY): Payer: Self-pay | Admitting: Internal Medicine

## 2020-07-23 DIAGNOSIS — I5043 Acute on chronic combined systolic (congestive) and diastolic (congestive) heart failure: Secondary | ICD-10-CM

## 2020-07-23 DIAGNOSIS — G4700 Insomnia, unspecified: Secondary | ICD-10-CM

## 2020-07-23 DIAGNOSIS — Z95811 Presence of heart assist device: Secondary | ICD-10-CM

## 2020-07-24 ENCOUNTER — Other Ambulatory Visit (HOSPITAL_COMMUNITY): Payer: Self-pay | Admitting: *Deleted

## 2020-07-24 DIAGNOSIS — I5043 Acute on chronic combined systolic (congestive) and diastolic (congestive) heart failure: Secondary | ICD-10-CM

## 2020-07-24 DIAGNOSIS — Z95811 Presence of heart assist device: Secondary | ICD-10-CM

## 2020-07-24 MED ORDER — FUROSEMIDE 40 MG PO TABS: 40.0000 mg | ORAL_TABLET | ORAL | 6 refills | Status: DC

## 2020-07-27 ENCOUNTER — Ambulatory Visit (HOSPITAL_COMMUNITY): Payer: Self-pay | Admitting: Pharmacist

## 2020-07-27 LAB — POCT INR: INR: 1.9 — AB (ref 2.0–3.0)

## 2020-07-27 NOTE — Progress Notes (Signed)
LVAD INR 

## 2020-07-29 ENCOUNTER — Ambulatory Visit (INDEPENDENT_AMBULATORY_CARE_PROVIDER_SITE_OTHER): Payer: Medicare HMO

## 2020-07-29 DIAGNOSIS — I255 Ischemic cardiomyopathy: Secondary | ICD-10-CM | POA: Diagnosis not present

## 2020-07-30 LAB — CUP PACEART REMOTE DEVICE CHECK
Battery Remaining Longevity: 58 mo
Battery Voltage: 2.98 V
Brady Statistic AP VP Percent: 0 %
Brady Statistic AP VS Percent: 0 %
Brady Statistic AS VP Percent: 0.04 %
Brady Statistic AS VS Percent: 99.96 %
Brady Statistic RA Percent Paced: 0 %
Brady Statistic RV Percent Paced: 0.04 %
Date Time Interrogation Session: 20220406063423
HighPow Impedance: 73 Ohm
Implantable Lead Implant Date: 20180409
Implantable Lead Implant Date: 20180409
Implantable Lead Implant Date: 20180716
Implantable Lead Implant Date: 20180716
Implantable Lead Location: 753858
Implantable Lead Location: 753858
Implantable Lead Location: 753859
Implantable Lead Location: 753860
Implantable Lead Model: 5076
Implantable Lead Model: 511212
Implantable Lead Model: 511212
Implantable Lead Serial Number: 263773
Implantable Lead Serial Number: 263775
Implantable Pulse Generator Implant Date: 20180409
Lead Channel Impedance Value: 266 Ohm
Lead Channel Impedance Value: 3686 Ohm
Lead Channel Impedance Value: 380 Ohm
Lead Channel Impedance Value: 399 Ohm
Lead Channel Impedance Value: 4047 Ohm
Lead Channel Impedance Value: 855 Ohm
Lead Channel Pacing Threshold Amplitude: 0.625 V
Lead Channel Pacing Threshold Amplitude: 0.875 V
Lead Channel Pacing Threshold Amplitude: 1.75 V
Lead Channel Pacing Threshold Pulse Width: 0.4 ms
Lead Channel Pacing Threshold Pulse Width: 0.4 ms
Lead Channel Pacing Threshold Pulse Width: 1 ms
Lead Channel Sensing Intrinsic Amplitude: 14.125 mV
Lead Channel Sensing Intrinsic Amplitude: 14.125 mV
Lead Channel Sensing Intrinsic Amplitude: 3.5 mV
Lead Channel Sensing Intrinsic Amplitude: 3.5 mV
Lead Channel Setting Pacing Amplitude: 2.5 V
Lead Channel Setting Pacing Pulse Width: 0.4 ms
Lead Channel Setting Sensing Sensitivity: 0.6 mV

## 2020-07-31 ENCOUNTER — Other Ambulatory Visit (HOSPITAL_COMMUNITY): Payer: Self-pay | Admitting: *Deleted

## 2020-07-31 DIAGNOSIS — Z95811 Presence of heart assist device: Secondary | ICD-10-CM

## 2020-07-31 DIAGNOSIS — Z7901 Long term (current) use of anticoagulants: Secondary | ICD-10-CM

## 2020-08-03 ENCOUNTER — Ambulatory Visit (HOSPITAL_COMMUNITY)
Admission: RE | Admit: 2020-08-03 | Discharge: 2020-08-03 | Disposition: A | Discharge status: Home / Self Care | Payer: Medicare HMO | Source: Ambulatory Visit | Attending: Internal Medicine | Admitting: Internal Medicine

## 2020-08-03 ENCOUNTER — Other Ambulatory Visit: Payer: Self-pay

## 2020-08-03 ENCOUNTER — Ambulatory Visit (HOSPITAL_COMMUNITY): Payer: Self-pay | Admitting: Pharmacist

## 2020-08-03 VITALS — BP 82/0 | HR 86 | Ht 65.0 in | Wt 181.4 lb

## 2020-08-03 DIAGNOSIS — I5022 Chronic systolic (congestive) heart failure: Secondary | ICD-10-CM

## 2020-08-03 DIAGNOSIS — Z95811 Presence of heart assist device: Secondary | ICD-10-CM | POA: Insufficient documentation

## 2020-08-03 DIAGNOSIS — Z5181 Encounter for therapeutic drug level monitoring: Secondary | ICD-10-CM | POA: Insufficient documentation

## 2020-08-03 DIAGNOSIS — I480 Paroxysmal atrial fibrillation: Secondary | ICD-10-CM

## 2020-08-03 DIAGNOSIS — I10 Essential (primary) hypertension: Secondary | ICD-10-CM

## 2020-08-03 DIAGNOSIS — I2510 Atherosclerotic heart disease of native coronary artery without angina pectoris: Secondary | ICD-10-CM

## 2020-08-03 DIAGNOSIS — Z7901 Long term (current) use of anticoagulants: Secondary | ICD-10-CM | POA: Insufficient documentation

## 2020-08-03 LAB — CBC
HCT: 52.4 % — ABNORMAL HIGH (ref 39.0–52.0)
Hemoglobin: 16.6 g/dL (ref 13.0–17.0)
MCH: 26.6 pg (ref 26.0–34.0)
MCHC: 31.7 g/dL (ref 30.0–36.0)
MCV: 84 fL (ref 80.0–100.0)
Platelets: 202 10*3/uL (ref 150–400)
RBC: 6.24 MIL/uL — ABNORMAL HIGH (ref 4.22–5.81)
RDW: 14.1 % (ref 11.5–15.5)
WBC: 8 10*3/uL (ref 4.0–10.5)
nRBC: 0 % (ref 0.0–0.2)

## 2020-08-03 LAB — VITAMIN B12: Vitamin B-12: 324 pg/mL (ref 180–914)

## 2020-08-03 LAB — IRON AND TIBC
Iron: 59 ug/dL (ref 45–182)
Saturation Ratios: 17 % — ABNORMAL LOW (ref 17.9–39.5)
TIBC: 356 ug/dL (ref 250–450)
UIBC: 297 ug/dL

## 2020-08-03 LAB — FERRITIN: Ferritin: 74 ng/mL (ref 24–336)

## 2020-08-03 LAB — BASIC METABOLIC PANEL
Anion gap: 9 (ref 5–15)
BUN: 19 mg/dL (ref 6–20)
CO2: 27 mmol/L (ref 22–32)
Calcium: 9.3 mg/dL (ref 8.9–10.3)
Chloride: 102 mmol/L (ref 98–111)
Creatinine, Ser: 1.67 mg/dL — ABNORMAL HIGH (ref 0.61–1.24)
GFR, Estimated: 48 mL/min — ABNORMAL LOW (ref >60)
Glucose, Bld: 110 mg/dL — ABNORMAL HIGH (ref 70–99)
Potassium: 4.1 mmol/L (ref 3.5–5.1)
Sodium: 138 mmol/L (ref 135–145)

## 2020-08-03 LAB — MAGNESIUM: Magnesium: 1.9 mg/dL (ref 1.7–2.4)

## 2020-08-03 LAB — PROTIME-INR
INR: 2 — ABNORMAL HIGH (ref 0.8–1.2)
Prothrombin Time: 22.2 seconds — ABNORMAL HIGH (ref 11.4–15.2)

## 2020-08-03 LAB — LACTATE DEHYDROGENASE: LDH: 192 U/L (ref 98–192)

## 2020-08-03 LAB — FOLATE: Folate: 10.2 ng/mL (ref >5.9)

## 2020-08-03 NOTE — Patient Instructions (Signed)
1. No changes in medications. 2. Return to clinic in 2 months  

## 2020-08-03 NOTE — Progress Notes (Signed)
LVAD INR 

## 2020-08-03 NOTE — Progress Notes (Signed)
Patient presents for 2 month follow up and annual maintenance for home VAD equipment in [facility] today with [name]) wife. Reports no problems or concerns with drive line.  Patient says he has been feeling great.   He says he has no physical limitations at this time.   Vital Signs:  Doppler Pressure: 82 Automatc BP:  114/86 (96) HR: 86 SR  SPO2: 97% RA  Weight: 181.4 lb w/o eqt Last weight: 182.4lb  VAD Indication: Destination therapy- CKD  VAD interrogation & Equipment Management: Speed: 5400 Flow: 4.8 Power: 4.2w    PI: 2.0  Alarms: few low voltage Events: 20 - 30 PI events daily  Fixed speed 5400 Low speed limit: 5100  New Primary Controller:  Replace back up battery in 29 months  Back up controller:  Replace back up battery in 16 months  Annual Equipment Maintenance on UBC/PM was performed today on 06/08/20.   I reviewed the LVAD parameters from today and compared the results to the patient's prior recorded data. LVAD interrogation was NEGATIVE for significant power changes, negative for clinical alarms and STABLE for PI events/speed drops. No programming changes were made and pump is functioning within specified parameters. Pt is performing daily controller and system monitor self tests along with completing weekly and monthly maintenance for LVAD equipment.  LVAD equipment check completed and is in good working order. Back up equipment present.    Exit Site Care: Drive line is being maintained weekly by [name], his wife. Exit site healed and incorporated, the velour is fully implanted at exit site. No redness, drainage or foul odor noted. Pt denies fever or chills. Pt given 8 weekly dressings and 8 anchors at this visit.    Device:Medtronic Bi-V Therapies: on Last check: 04/29/20   BP & Labs:  Doppler BP 82 - Doppler is reflecting MAP  Hgb 16.6  - No S/S of bleeding. Specifically denies melena/BRBPR or nosebleeds.  LDH stable at 192 with  established baseline of 165- 250. Denies tea-colored urine. No power elevations noted on interrogation.    Patient Instructions:  1. No change in medications. 2. Return to [facility] in 2 months.   Tanda Rockers, RN VAD Coordinator  Office: [phone]  24/7 Pager: [phone]

## 2020-08-05 NOTE — Progress Notes (Signed)
LVAD Clinic Follow Up Note HF MD: Dr Haroldine Laws  HPI: David [name] is a 55 y.o. male with history of ICD (09 PCI to LAD, mRCA, '10 PCI to Lcx and '12 mRCA), HFrEF (EF ~20% for >5 years), HLD and HTN. S/P LVAD HM-III 04/20/18.   Admitted 04/19/17 through 05/27/2017 for scheduled LVAD implant. On 12/27 he underwent HMIII LVAD implant. Hospital course complicated by PNA with achromobacter tracheal aspirate, shock liver, and AKI. Required short term dialysis.  Readmitted 05/30/17 with symptomatic anemia hgb 3.8.  INR 2.9. He was transfused with 4 units of packed red blood cell. 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  It was suspected AVMs were the likely cause of bleeding in the setting of anticoagulation.  He will need colonoscopy at some point, this can be done as outpatient. Discharged 06/04/17.  Underwent DC-CV for AFL on 10/17/18   Follow up for Heart Failure/LVAD: Returns for routine VAD f/u with his wife. Continues to do very well. Says he can do anything he wants without any limitations. Denies orthopnea or PND. No fevers, chills or problems with driveline. No bleeding, melena or neuro symptoms. No VAD alarms. Taking all meds as prescribed.   VAD Indication: Destination therapy- CKD  VAD interrogation & Equipment Management: Speed: 5400 Flow: 4.8 Power: 4.2w PI: 2.0  Alarms: few low voltage Events: 20 - 30 PI events daily  Fixed speed 5400 Low speed limit: 5100  New Primary Controller:  Replace back up battery in 29 months  Back up controller: Replace back up battery in 16 months   Annual Equipment Maintenance on UBC/PM was performed today on 06/08/20.    Past Medical History:  Diagnosis Date  . AICD (automatic cardioverter/defibrillator) present   . Anemia   . Anxiety   . CAD (coronary artery  disease) 2009   AMI in 12/2007 with PCI to LAD, staged PCI to  mid/distal RCA, NSTEMI in 02/2009 with BMS to LCx  . CHF (congestive heart failure) ([place])   . Chronic kidney disease 11/03/2016   stage 3 kidney disease  . Dysrhythmia    "arrythmia problems at some point", "fatal rhythms"  . History of blood transfusion    "I was bleeding from was rectum" (04/19/2017)  . HLD (hyperlipidemia)   . HTN (hypertension)   . Ischemic cardiomyopathy    Admitted in 07/2010 with CHF exacerbation  . Myocardial infarction ([place])    "I've had 4" (04/19/2017)  . Pneumonia 2018 X 1  . Seizures ([place])    one seizure in 04/2016 during cardiac event (04/19/2017)    Current Outpatient Medications  Medication Sig Dispense Refill  . amiodarone (PACERONE) 200 MG tablet Take 0.5 tablets (100 mg total) by mouth daily. 30 tablet 5  . atorvastatin (LIPITOR) 40 MG tablet Take 1 tab daily 90 tablet 3  . furosemide (LASIX) 40 MG tablet Take 1 tablet (40 mg total) by mouth every other day. 45 tablet 3  . furosemide (LASIX) 40 MG tablet Take 1 tablet (40 mg total) by mouth every other day. TAKE 1 TABLET BY MOUTH ONCE DAILY. 90 tablet 6  . gabapentin (NEURONTIN) 300 MG capsule Take 1 capsule (300 mg total) by mouth 2 (two) times daily. 60 capsule 6  . losartan (COZAAR) 50 MG tablet Take 1 tablet (50 mg total) by mouth daily. 90 tablet 3  . magnesium oxide (MAG-OX)  400 MG tablet Take 1 tablet (400 mg total) by mouth 2 (two) times daily. 60 tablet 6  . pantoprazole (PROTONIX) 40 MG tablet Take 1 tablet (40 mg total) by mouth daily. 90 tablet 3  . potassium chloride SA (KLOR-CON M20) 20 MEQ tablet Take 1 tablet (20 mEq total) by mouth daily. KEEP OV. 90 tablet 3  . traZODone (DESYREL) 50 MG tablet TAKE 1 TABLET BY MOUTH AT BEDTIME 30 tablet 5  . warfarin (COUMADIN) 2 MG tablet Take 4 mg (2 tabs) every Monday/Friday and 2 mg (1 tab) all other days or as directed by HF clinic 60 tablet 11   No current facility-administered  medications for this encounter.    Plavix [clopidogrel bisulfate]  Review of systems complete and found to be negative unless listed in HPI.     Vitals:   08/03/20 1351 08/03/20 1355  BP: 114/86 (!) 82/0  Pulse: 86   SpO2: 97%    Wt Readings from Last 3 Encounters:  08/03/20 82.3 kg (181 lb 6.4 oz)  06/08/20 60.1 kg (132 lb 6.4 oz)  03/24/20 81.3 kg (179 lb 3.2 oz)     Vital Signs:  Doppler Pressure: 82 Automatc BP:  114/86 (96) HR: 86 SR  SPO2: 97% RA  Weight: 181.4 lb w/o eqt Last weight: 182.4lb  Physical Exam: General:  NAD.  HEENT: normal  Neck: supple. JVP not elevated.  Carotids 2+ bilat; no bruits. No lymphadenopathy or thryomegaly appreciated. Cor: LVAD hum.  Lungs: Clear. Abdomen: soft, nontender, non-distended. No hepatosplenomegaly. No bruits or masses. Good bowel sounds. Driveline site clean. Anchor in place.  Extremities: no cyanosis, clubbing, rash. Warm no edema  Neuro: alert & oriented x 3. No focal deficits. Moves all 4 without problem    ASSESSMENT AND PLAN:   1. Chronic systolic CHF with HM III LVAD implant 04/20/17:  - Continues to do very well with VAD support. - Remains NYHA I. Volume status looks good.  - He has been [facility] at [facility] for transplant referral but has decided not to pursue VAD for now. No change  2. Persistent AFL with RVR - had DC-CV on 10/17/18 with subsequent short episodes of AF after - No breakthrough AF.  - Remains in NSR on amio 100 daily - check amio labs  3. h/o UGI AVM bleed - s/p APC x 4 lesions in 2/19 - No recurrent bleeding - Continue PPI - Off ASA due to GIB.  - Hgb stable at 16.6  4. CKD Stage IIIa:  - Creatinine baseline 1.3 - 1.7  - Creatinine stable at 1.67 today  5. VAD management - VAD interrogated personally. Parameters stable. - LDH 192 - INR goal 2.0-2.5 Off ASA due to GIB 2/19.  - INR 2.0 Discussed dosing with PharmD personally. - MAPs are good - Had large tear in power cord to  controller. Controller change performed in clinic. Reinforced need for caution with equipment   6. CAD:  s/p PCI RCA/PLOM with DES x2 and DES to mild LAD 1/18 - No s/s angina - Continue statin. Off ASA due to GIB  7. Hypomagnesemia. - Mag 1.9 today. Reminded him to take his mag supplements - no change  Total time spent 35 minutes. Over half that time spent discussing above.    Glori Bickers, MD  11:47 PM

## 2020-08-10 ENCOUNTER — Ambulatory Visit (HOSPITAL_COMMUNITY): Payer: Self-pay | Admitting: Pharmacist

## 2020-08-10 LAB — POCT INR: INR: 2.7 (ref 2.0–3.0)

## 2020-08-10 NOTE — Progress Notes (Signed)
Remote ICD transmission.   

## 2020-08-10 NOTE — Progress Notes (Signed)
LVAD INR 

## 2020-08-17 ENCOUNTER — Ambulatory Visit (HOSPITAL_COMMUNITY): Payer: Self-pay | Admitting: Pharmacist

## 2020-08-17 LAB — POCT INR: INR: 2.2 (ref 2.0–3.0)

## 2020-08-17 NOTE — Progress Notes (Signed)
LVAD INR 

## 2020-08-24 ENCOUNTER — Ambulatory Visit (HOSPITAL_COMMUNITY): Payer: Self-pay | Admitting: Pharmacist

## 2020-08-24 LAB — POCT INR: INR: 1.9 — AB (ref 2.0–3.0)

## 2020-08-24 NOTE — Progress Notes (Signed)
LVAD INR 

## 2020-08-31 ENCOUNTER — Ambulatory Visit (HOSPITAL_COMMUNITY): Payer: Self-pay

## 2020-08-31 LAB — POCT INR: INR: 2.5 (ref 2.0–3.0)

## 2020-08-31 NOTE — Progress Notes (Signed)
LVAD INR 

## 2020-09-07 ENCOUNTER — Ambulatory Visit (HOSPITAL_COMMUNITY): Payer: Self-pay

## 2020-09-07 LAB — POCT INR: INR: 1.9 — AB (ref 2.0–3.0)

## 2020-09-07 NOTE — Patient Instructions (Signed)
Description   INR slightly < goal. No dose change required. Instructed to continue current regimen of warfarin 4 mg every Monday/Friday and 2 mg all other days. Refuses lovenox. Continue greens once - twice a week.  Home INR machine, checks every Monday   Call [name] (wife) - even if leave VM, would call back later to speak with her. VM usually does not go through and she will call inpatient pharmacy.

## 2020-09-07 NOTE — Progress Notes (Signed)
LVAD INR 

## 2020-09-14 ENCOUNTER — Ambulatory Visit (HOSPITAL_COMMUNITY): Payer: Self-pay

## 2020-09-14 LAB — POCT INR: INR: 1.9 — AB (ref 2.0–3.0)

## 2020-09-14 NOTE — Progress Notes (Signed)
LVAD INR 

## 2020-09-22 ENCOUNTER — Ambulatory Visit (HOSPITAL_COMMUNITY): Payer: Self-pay

## 2020-09-22 LAB — POCT INR: INR: 2.2 (ref 2.0–3.0)

## 2020-09-22 NOTE — Progress Notes (Signed)
LVAD INR 

## 2020-09-28 ENCOUNTER — Ambulatory Visit (HOSPITAL_COMMUNITY): Payer: Self-pay

## 2020-09-28 LAB — POCT INR: INR: 2.3 (ref 2.0–3.0)

## 2020-09-28 NOTE — Progress Notes (Signed)
LVAD INR 

## 2020-10-02 ENCOUNTER — Other Ambulatory Visit (HOSPITAL_COMMUNITY): Payer: Self-pay | Admitting: *Deleted

## 2020-10-02 DIAGNOSIS — Z95811 Presence of heart assist device: Secondary | ICD-10-CM

## 2020-10-02 DIAGNOSIS — Z7901 Long term (current) use of anticoagulants: Secondary | ICD-10-CM

## 2020-10-02 DIAGNOSIS — Z79899 Other long term (current) drug therapy: Secondary | ICD-10-CM

## 2020-10-05 ENCOUNTER — Ambulatory Visit (HOSPITAL_COMMUNITY)
Admission: RE | Admit: 2020-10-05 | Discharge: 2020-10-05 | Disposition: A | Discharge status: Home / Self Care | Payer: Medicare HMO | Source: Ambulatory Visit | Attending: Internal Medicine | Admitting: Internal Medicine

## 2020-10-05 ENCOUNTER — Encounter (HOSPITAL_COMMUNITY): Payer: Self-pay

## 2020-10-05 ENCOUNTER — Other Ambulatory Visit: Payer: Self-pay

## 2020-10-05 ENCOUNTER — Ambulatory Visit (HOSPITAL_COMMUNITY): Payer: Self-pay

## 2020-10-05 VITALS — BP 113/78 | HR 93 | Temp 98.0°F | Wt 181.4 lb

## 2020-10-05 DIAGNOSIS — N183 Chronic kidney disease, stage 3 unspecified: Secondary | ICD-10-CM | POA: Insufficient documentation

## 2020-10-05 DIAGNOSIS — I5022 Chronic systolic (congestive) heart failure: Secondary | ICD-10-CM | POA: Diagnosis not present

## 2020-10-05 DIAGNOSIS — Z95811 Presence of heart assist device: Secondary | ICD-10-CM | POA: Insufficient documentation

## 2020-10-05 DIAGNOSIS — J069 Acute upper respiratory infection, unspecified: Secondary | ICD-10-CM | POA: Diagnosis not present

## 2020-10-05 DIAGNOSIS — R0981 Nasal congestion: Secondary | ICD-10-CM | POA: Insufficient documentation

## 2020-10-05 DIAGNOSIS — I132 Hypertensive heart and chronic kidney disease with heart failure and with stage 5 chronic kidney disease, or end stage renal disease: Secondary | ICD-10-CM | POA: Insufficient documentation

## 2020-10-05 DIAGNOSIS — E785 Hyperlipidemia, unspecified: Secondary | ICD-10-CM | POA: Diagnosis not present

## 2020-10-05 DIAGNOSIS — Z7901 Long term (current) use of anticoagulants: Secondary | ICD-10-CM | POA: Diagnosis not present

## 2020-10-05 DIAGNOSIS — T7840XA Allergy, unspecified, initial encounter: Secondary | ICD-10-CM

## 2020-10-05 DIAGNOSIS — R059 Cough, unspecified: Secondary | ICD-10-CM | POA: Diagnosis present

## 2020-10-05 DIAGNOSIS — I2510 Atherosclerotic heart disease of native coronary artery without angina pectoris: Secondary | ICD-10-CM | POA: Insufficient documentation

## 2020-10-05 DIAGNOSIS — Z95810 Presence of automatic (implantable) cardiac defibrillator: Secondary | ICD-10-CM | POA: Insufficient documentation

## 2020-10-05 DIAGNOSIS — Z79899 Other long term (current) drug therapy: Secondary | ICD-10-CM | POA: Diagnosis not present

## 2020-10-05 DIAGNOSIS — I10 Essential (primary) hypertension: Secondary | ICD-10-CM

## 2020-10-05 DIAGNOSIS — Z955 Presence of coronary angioplasty implant and graft: Secondary | ICD-10-CM | POA: Insufficient documentation

## 2020-10-05 LAB — SEDIMENTATION RATE: Sed Rate: 25 mm/hr — ABNORMAL HIGH (ref 0–16)

## 2020-10-05 LAB — COMPREHENSIVE METABOLIC PANEL
ALT: 18 U/L (ref 0–44)
AST: 30 U/L (ref 15–41)
Albumin: 3.1 g/dL — ABNORMAL LOW (ref 3.5–5.0)
Alkaline Phosphatase: 92 U/L (ref 38–126)
Anion gap: 9 (ref 5–15)
BUN: 10 mg/dL (ref 6–20)
CO2: 23 mmol/L (ref 22–32)
Calcium: 8.9 mg/dL (ref 8.9–10.3)
Chloride: 104 mmol/L (ref 98–111)
Creatinine, Ser: 1.44 mg/dL — ABNORMAL HIGH (ref 0.61–1.24)
GFR, Estimated: 57 mL/min — ABNORMAL LOW (ref >60)
Glucose, Bld: 101 mg/dL — ABNORMAL HIGH (ref 70–99)
Potassium: 4.3 mmol/L (ref 3.5–5.1)
Sodium: 136 mmol/L (ref 135–145)
Total Bilirubin: 0.8 mg/dL (ref 0.3–1.2)
Total Protein: 7.7 g/dL (ref 6.5–8.1)

## 2020-10-05 LAB — CBC
HCT: 48 % (ref 39.0–52.0)
Hemoglobin: 15.5 g/dL (ref 13.0–17.0)
MCH: 26.7 pg (ref 26.0–34.0)
MCHC: 32.3 g/dL (ref 30.0–36.0)
MCV: 82.6 fL (ref 80.0–100.0)
Platelets: 226 10*3/uL (ref 150–400)
RBC: 5.81 MIL/uL (ref 4.22–5.81)
RDW: 13.7 % (ref 11.5–15.5)
WBC: 11.7 10*3/uL — ABNORMAL HIGH (ref 4.0–10.5)
nRBC: 0 % (ref 0.0–0.2)

## 2020-10-05 LAB — TSH: TSH: 1.302 u[IU]/mL (ref 0.350–4.500)

## 2020-10-05 LAB — PROTIME-INR
INR: 2.9 — ABNORMAL HIGH (ref 0.8–1.2)
Prothrombin Time: 30.4 seconds — ABNORMAL HIGH (ref 11.4–15.2)

## 2020-10-05 LAB — LACTATE DEHYDROGENASE: LDH: 209 U/L — ABNORMAL HIGH (ref 98–192)

## 2020-10-05 LAB — MAGNESIUM: Magnesium: 2 mg/dL (ref 1.7–2.4)

## 2020-10-05 LAB — PREALBUMIN: Prealbumin: 18.4 mg/dL (ref 18–38)

## 2020-10-05 MED ORDER — CETIRIZINE HCL 10 MG PO TABS: 10.0000 mg | ORAL_TABLET | Freq: Every day | ORAL | 5 refills | Status: DC

## 2020-10-05 MED ORDER — FLUTICASONE PROPIONATE 50 MCG/ACT NA SUSP: 1.0000 | Freq: Every day | NASAL | 4 refills | Status: DC

## 2020-10-05 MED ORDER — DOXYCYCLINE HYCLATE 100 MG PO CAPS
100.0000 mg | ORAL_CAPSULE | Freq: Two times a day (BID) | ORAL | 0 refills | Status: AC
Start: 2020-10-05 — End: 2020-10-12

## 2020-10-05 NOTE — Patient Instructions (Addendum)
Start Doxycycline 100 mg twice daily for 7 days Start Zyrtec 10 mg daily May use Flonase daily as needed 4. Coumadin dosing per [facility] PharmD 5. Return to [place] clinic in 2 months for follow-up with Dr Haroldine Laws

## 2020-10-05 NOTE — Progress Notes (Signed)
LVAD INR 

## 2020-10-05 NOTE — Progress Notes (Addendum)
Patient presents for 2 month follow up 3.5 year Interm[facility] VAD Clinic today with David [name]) wife. Reports no problems or concerns with drive line.  Patient says he has been feeling great other than "really bad allergies" for the last week. Reports he is constantly coughing, throat "tickling," sinus pressure, intermittent nausea due to drainage, and itchy eyes. Sputum is light green. Reports when he lays down he "can't stop coughing." He did a home COVID test on Friday which was negative. Denies fever or chills. Reports he has tried Delsym, Mucinex, and Benadryl with no relief. Per Dr Haroldine Laws will start Doxycycline 100 mg BID x 7 days. Zyrtex 10 mg daily, and Flonase. If symptoms do not resolve with the above instructed to follow up with PCP.   He says he has no physical limitations at this time.   Amiodarone labs TSH/T4 pending.   WBC 11.7 Sed rate 25 today.   Vital Signs:  Temp: 98.0 Doppler Pressure: 110 Automatc BP: 113/78 (100) HR: 93 SR  SPO2: 95% RA   Weight: 181.4 lb w/o eqt Last weight: 181.4 lb  VAD Indication: Destination therapy- CKD   VAD interrogation & Equipment Management: Speed: 5400 [name]: 4.5 Power: 4.3w    PI: 4.9   Alarms: few low voltage advisories Events: 40 - 60 PI events daily  Fixed speed 5400 Low speed limit: 5100   New Primary Controller:  Replace back up battery in 27 months  Back up controller:  Replace back up battery in 14 months   Annual Equipment Maintenance on UBC/PM was performed today on 06/08/20.    I reviewed the LVAD parameters from today and compared the results to the patient's prior recorded data. LVAD interrogation was NEGATIVE for significant power changes, negative for clinical alarms and STABLE for PI events/speed drops. No programming changes were made and pump is functioning within specified parameters. Pt is performing daily controller and system monitor self tests along with completing weekly and monthly maintenance for LVAD  equipment.   LVAD equipment check completed and is in good working order. Back up equipment present.     Exit Site Care: Drive line is being maintained weekly by David [name], his wife. Exit site healed and incorporated, the velour is fully implanted at exit site. No redness, drainage or foul odor noted. Pt denies fever or chills. Pt given 8 weekly dressings and adhesive remover wipes for home use.   Device: Medtronic Bi-V Therapies: on Last check: 04/29/20   BP & Labs:  Doppler BP 110 - Doppler is reflecting modified systolic   Hgb [id number] No S/S of bleeding. Specifically denies melena/BRBPR or nosebleeds.  LDH stable at 209 with established baseline of 165- 250. Denies tea-colored urine. No power elevations noted on interrogation.    3.5 year Intermacs follow up completed including:  Quality of Life, KCCQ-12, and Neurocognitive trail making.   Pt did not feel up to completing 6 minute walk today.  Back up controller:  11V backup battery charged during this visit.  [place] Cardiomyopathy Questionnaire  KCCQ-12 10/05/2020 03/24/2020 11/13/2019  1 a. Ability to shower/bathe Not at all limited Not at all limited Not at all limited  1 b. Ability to walk 1 block Not at all limited Not at all limited Not at all limited  1 c. Ability to hurry/jog Not at all limited Slightly limited Not at all limited  2. Edema feet/ankles/legs Never over the past 2 weeks Never over the past 2 weeks Never over the past 2 weeks  3. Limited by fatigue Never over the past 2 weeks Never over the past 2 weeks Never over the past 2 weeks  4. Limited by dyspnea Never over the past 2 weeks Less than once a week Never over the past 2 weeks  5. Sitting up / on 3+ pillows Never over the past 2 weeks Never over the past 2 weeks Never over the past 2 weeks  6. Limited enjoyment of life Slightly limited Slightly limited Slightly limited  7. Rest of life w/ symptoms Completely satisfied Completely satisfied Mostly satisfied   8 a. Participation in hobbies Slightly limited Moderately limited Slightly limited  8 b. Participation in chores Did not limit at all Moderately limited Slightly limited  8 c. Visiting family/friends Did not limit at all N/A, did not do for other reasons Did not limit at all     Patient Instructions:  Start Doxycycline 100 mg twice daily for 7 days Start Zyrtec 10 mg daily May use Flonase daily as needed 4. Coumadin dosing per [facility] PharmD 5. Return to [place] clinic in 2 months for follow-up with Dr Haroldine Laws   Emerson Monte RN [place] Coordinator  Office: [phone]  24/7 Pager: [phone]

## 2020-10-05 NOTE — Progress Notes (Signed)
LVAD Clinic Follow Up Note HF MD: Dr Haroldine Laws  HPI: David [name] is a 55 y.o. male with history of ICD (09 PCI to LAD, mRCA, '10 PCI to Lcx and '12 mRCA), HFrEF (EF ~20% for > 5 years), HLD and HTN. S/P LVAD HM-III 04/20/18.    Admitted 04/19/17 through 05/27/2017 for scheduled LVAD implant. On 12/27 he underwent HMIII LVAD implant. Hospital course complicated by PNA with achromobacter tracheal aspirate, shock liver, and AKI. Required short term dialysis.  Readmitted 05/30/17 with symptomatic anemia hgb 3.8.  INR 2.9. He was transfused with 4 units of packed red blood cell. 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  It was suspected AVMs were the likely cause of bleeding in the setting of anticoagulation.  He will need colonoscopy at some point, this can be done as outpatient. Discharged 06/04/17.  Underwent DC-CV for AFL on 10/17/18   Follow up for Heart Failure/LVAD: Returns for routine VAD f/u with his wife. For past week has had severe cough and congestion with watery eyes. Coughing up some green sputum at times. No loss of smell or taste. Coughing worse when lying back. No edema or PND. Otherwise doing well. No fevers, chills or problems with driveline. No bleeding, melena or neuro symptoms. No VAD alarms. Taking all meds as prescribed.   VAD Indication: Destination therapy- CKD   VAD interrogation & Equipment Management: Speed: 5400 Flow: 4.5 Power: 4.3w    PI: 4.9   Alarms: few low voltage advisories Events: 40 - 60 PI events daily  Fixed speed 5400 Low speed limit: 5100   New Primary Controller:  Replace back up battery in 27 months   Back up controller:  Replace back up battery in 14 months   Annual Equipment Maintenance on UBC/PM was performed today on 06/08/20.      Past Medical History:  Diagnosis Date    AICD (automatic cardioverter/defibrillator) present    Anemia    Anxiety    CAD (coronary artery disease) 2009   AMI in 12/2007 with PCI to LAD, staged PCI to  mid/distal RCA, NSTEMI in 02/2009 with BMS to LCx   CHF (congestive heart failure) ([place])    Chronic kidney disease 11/03/2016   stage 3 kidney disease   Dysrhythmia    "arrythmia problems at some point", "fatal rhythms"   History of blood transfusion    "I was bleeding from was rectum" (04/19/2017)   HLD (hyperlipidemia)    HTN (hypertension)    Ischemic cardiomyopathy    Admitted in 07/2010 with CHF exacerbation   Myocardial infarction ([place])    "I've had 4" (04/19/2017)   Pneumonia 2018 X 1   Seizures ([place])    one seizure in 04/2016 during cardiac event (04/19/2017)    Current Outpatient Medications  Medication Sig Dispense Refill   amiodarone (PACERONE) 200 MG tablet Take 0.5 tablets (100 mg total) by mouth daily. 30 tablet 5   atorvastatin (LIPITOR) 40 MG tablet Take 1 tab daily 90 tablet 3   cetirizine (ZYRTEC ALLERGY) 10 MG tablet Take 1 tablet (10 mg total) by mouth daily. 30 tablet 5   doxycycline (VIBRAMYCIN) 100 MG capsule Take 1 capsule (100 mg total) by mouth 2 (two) times daily for 7 days. 14 capsule 0   fluticasone (FLONASE) 50 MCG/ACT nasal spray Place 1 spray into both nostrils daily. 9.9 mL 4  furosemide (LASIX) 40 MG tablet Take 1 tablet (40 mg total) by mouth every other day. TAKE 1 TABLET BY MOUTH ONCE DAILY. 90 tablet 6   gabapentin (NEURONTIN) 300 MG capsule Take 1 capsule (300 mg total) by mouth 2 (two) times daily. 60 capsule 6   losartan (COZAAR) 50 MG tablet Take 1 tablet (50 mg total) by mouth daily. 90 tablet 3   magnesium oxide (MAG-OX) 400 MG tablet Take 1 tablet (400 mg total) by mouth 2 (two) times daily. 60 tablet 6   Misc Natural Products (ELDERBERRY IMMUNE COMPLEX) CHEW Chew by mouth.     pantoprazole (PROTONIX) 40 MG tablet Take 1 tablet (40 mg total) by mouth daily. 90 tablet 3   potassium  chloride SA (KLOR-CON M20) 20 MEQ tablet Take 1 tablet (20 mEq total) by mouth daily. KEEP OV. 90 tablet 3   traZODone (DESYREL) 50 MG tablet TAKE 1 TABLET BY MOUTH AT BEDTIME 30 tablet 5   warfarin (COUMADIN) 2 MG tablet Take 4 mg (2 tabs) every Monday/Friday and 2 mg (1 tab) all other days or as directed by HF clinic 60 tablet 11   No current facility-administered medications for this encounter.    Plavix [clopidogrel bisulfate]  Review of systems complete and found to be negative unless listed in HPI.      Vitals:   10/05/20 1028 10/05/20 1030  BP: (!) 110/0 113/78  Pulse: 93   Temp: 98 F (36.7 C)   SpO2: 95%    Wt Readings from Last 3 Encounters:  10/05/20 82.3 kg (181 lb 6.4 oz)  08/03/20 82.3 kg (181 lb 6.4 oz)  06/08/20 60.1 kg (132 lb 6.4 oz)    Vital Signs:  Temp: 98.0 Doppler Pressure: 110 Automatc BP: 113/78 (100) HR: 93 SR SPO2: 95% RA   Weight: 181.4 lb w/o eqt Last weight: 181.4 lb      Physical Exam: General:  NAD.  HEENT: normal  Neck: supple. JVP not elevated.  Carotids 2+ bilat; no bruits. No lymphadenopathy or thryomegaly appreciated. Cor: LVAD hum.  Lungs: Clear. Abdomen: soft, nontender, non-distended. No hepatosplenomegaly. No bruits or masses. Good bowel sounds. Driveline site clean. Anchor in place.  Extremities: no cyanosis, clubbing, rash. Warm no edema  Neuro: alert & oriented x 3. No focal deficits. Moves all 4 without problem    ASSESSMENT AND PLAN:   1. Chronic systolic CHF with HM III LVAD implant 04/20/17:  - Continues to do very well with VAD support. - NYHA I. Volume sttus looks good. Ok to take 1 extra dose of lasix to see if it helps at all with his cough given positional nature - [facility] but has decided not to pursue VAD for now. No change  2. Persistent AFL with RVR - had DC-CV on 10/17/18 with subsequent short episodes of AF after - No breakthrough AF.  - Remains in NSR on amio   - check amio labs - If cough persists will need CXR and ESR  3. Cough - suspect allergies or viral syndrome - given green sputum and duration will give doxy 100 bid x 1 week.  - Add zyrtec. Continue cough Delsom   4. h/o UGI AVM bleed - s/p APC x 4 lesions in 2/19 - No recurrent bleeding - Continue PPI - Off ASA due to GIB.  - Hgb stable at 15.5  4. CKD Stage IIIa:  - Creatinine baseline 1.3 - 1.7  - Creatinine  1.44 today  5. VAD management - VAD interrogated personally. Parameters stable. - LDH 209 - INR goal 2.0-2.5 Off ASA due to GIB 2/19.  - INR 2.9 Discussed dosing with PharmD personally. - MAPs slightly elevated but stable. Has not tolerated tighter control well - At last visit had large tear in power cord to controller. Controller change performed in clinic. Reinforced need for caution with equipment  6. CAD:  s/p PCI RCA/PLOM with DES x2 and DES to mild LAD 1/18 - No s/s angina - Continue statin. Off ASA due to GIB  7. Hypomagnesemia. - Mg 2.0 today. Continue mag supplements - no change  Total time spent 35 minutes. Over half that time spent discussing above.    Glori Bickers, MD  11:11 AM

## 2020-10-06 LAB — T4: T4, Total: 12.4 ug/dL — ABNORMAL HIGH (ref 4.5–12.0)

## 2020-10-12 ENCOUNTER — Ambulatory Visit (HOSPITAL_COMMUNITY): Payer: Self-pay

## 2020-10-12 LAB — POCT INR: INR: 1.5 — AB (ref 2.0–3.0)

## 2020-10-12 NOTE — Progress Notes (Signed)
LVAD INR 

## 2020-10-19 ENCOUNTER — Ambulatory Visit (HOSPITAL_COMMUNITY): Payer: Self-pay

## 2020-10-19 LAB — POCT INR: INR: 2.1 (ref 2.0–3.0)

## 2020-10-19 NOTE — Progress Notes (Signed)
LVAD INR 

## 2020-10-27 ENCOUNTER — Ambulatory Visit (HOSPITAL_COMMUNITY): Payer: Self-pay

## 2020-10-27 LAB — POCT INR: INR: 2.6 (ref 2.0–3.0)

## 2020-10-27 NOTE — Progress Notes (Signed)
LVAD INR 

## 2020-10-28 ENCOUNTER — Ambulatory Visit (INDEPENDENT_AMBULATORY_CARE_PROVIDER_SITE_OTHER): Payer: Medicare HMO

## 2020-10-28 DIAGNOSIS — I255 Ischemic cardiomyopathy: Secondary | ICD-10-CM

## 2020-10-30 LAB — CUP PACEART REMOTE DEVICE CHECK
Battery Remaining Longevity: 54 mo
Battery Voltage: 2.96 V
Brady Statistic AP VP Percent: 0 %
Brady Statistic AP VS Percent: 0 %
Brady Statistic AS VP Percent: 0.04 %
Brady Statistic AS VS Percent: 99.96 %
Brady Statistic RA Percent Paced: 0 %
Brady Statistic RV Percent Paced: 0.05 %
Date Time Interrogation Session: 20220707162413
HighPow Impedance: 82 Ohm
Implantable Lead Implant Date: 20180409
Implantable Lead Implant Date: 20180409
Implantable Lead Implant Date: 20180716
Implantable Lead Implant Date: 20180716
Implantable Lead Location: 753858
Implantable Lead Location: 753858
Implantable Lead Location: 753859
Implantable Lead Location: 753860
Implantable Lead Model: 5076
Implantable Lead Model: 511212
Implantable Lead Model: 511212
Implantable Lead Serial Number: 263773
Implantable Lead Serial Number: 263775
Implantable Pulse Generator Implant Date: 20180409
Lead Channel Impedance Value: 323 Ohm
Lead Channel Impedance Value: 3648 Ohm
Lead Channel Impedance Value: 399 Ohm
Lead Channel Impedance Value: 4047 Ohm
Lead Channel Impedance Value: 437 Ohm
Lead Channel Impedance Value: 912 Ohm
Lead Channel Pacing Threshold Amplitude: 0.625 V
Lead Channel Pacing Threshold Amplitude: 0.75 V
Lead Channel Pacing Threshold Amplitude: 1.75 V
Lead Channel Pacing Threshold Pulse Width: 0.4 ms
Lead Channel Pacing Threshold Pulse Width: 0.4 ms
Lead Channel Pacing Threshold Pulse Width: 1 ms
Lead Channel Sensing Intrinsic Amplitude: 14 mV
Lead Channel Sensing Intrinsic Amplitude: 14 mV
Lead Channel Sensing Intrinsic Amplitude: 3.25 mV
Lead Channel Sensing Intrinsic Amplitude: 3.25 mV
Lead Channel Setting Pacing Amplitude: 2.5 V
Lead Channel Setting Pacing Pulse Width: 0.4 ms
Lead Channel Setting Sensing Sensitivity: 0.6 mV

## 2020-11-02 ENCOUNTER — Ambulatory Visit (HOSPITAL_COMMUNITY): Payer: Self-pay

## 2020-11-02 LAB — POCT INR: INR: 2.1 (ref 2.0–3.0)

## 2020-11-02 NOTE — Progress Notes (Signed)
LVAD INR 

## 2020-11-09 ENCOUNTER — Ambulatory Visit (HOSPITAL_COMMUNITY): Payer: Self-pay

## 2020-11-09 LAB — POCT INR: INR: 1.8 — AB (ref 2.0–3.0)

## 2020-11-09 NOTE — Progress Notes (Signed)
LVAD INR 

## 2020-11-16 ENCOUNTER — Ambulatory Visit (HOSPITAL_COMMUNITY): Payer: Self-pay

## 2020-11-16 LAB — POCT INR: INR: 2.2 (ref 2.0–3.0)

## 2020-11-16 NOTE — Progress Notes (Signed)
LVAD INR 

## 2020-11-18 NOTE — Progress Notes (Signed)
Remote ICD transmission.   

## 2020-11-23 ENCOUNTER — Ambulatory Visit (HOSPITAL_COMMUNITY): Payer: Self-pay | Admitting: Pharmacist

## 2020-11-23 LAB — POCT INR: INR: 2.2 (ref 2.0–3.0)

## 2020-11-23 NOTE — Progress Notes (Signed)
LVAD INR 

## 2020-11-30 ENCOUNTER — Ambulatory Visit (HOSPITAL_COMMUNITY): Payer: Self-pay | Admitting: Pharmacist

## 2020-11-30 LAB — POCT INR: INR: 2.2 (ref 2.0–3.0)

## 2020-11-30 NOTE — Progress Notes (Signed)
LVAD INR 

## 2020-12-04 ENCOUNTER — Other Ambulatory Visit (HOSPITAL_COMMUNITY): Payer: Self-pay | Admitting: *Deleted

## 2020-12-04 DIAGNOSIS — I5022 Chronic systolic (congestive) heart failure: Secondary | ICD-10-CM

## 2020-12-04 DIAGNOSIS — Z7901 Long term (current) use of anticoagulants: Secondary | ICD-10-CM

## 2020-12-04 DIAGNOSIS — Z95811 Presence of heart assist device: Secondary | ICD-10-CM

## 2020-12-07 ENCOUNTER — Ambulatory Visit (HOSPITAL_COMMUNITY): Payer: Self-pay | Admitting: Pharmacist

## 2020-12-07 ENCOUNTER — Other Ambulatory Visit (HOSPITAL_COMMUNITY): Payer: Self-pay

## 2020-12-07 ENCOUNTER — Other Ambulatory Visit: Payer: Self-pay

## 2020-12-07 ENCOUNTER — Ambulatory Visit (HOSPITAL_COMMUNITY)
Admission: RE | Admit: 2020-12-07 | Discharge: 2020-12-07 | Disposition: A | Discharge status: Home / Self Care | Payer: Medicare HMO | Source: Ambulatory Visit | Attending: Cardiology | Admitting: Cardiology

## 2020-12-07 VITALS — BP 80/0 | HR 85 | Ht 65.0 in | Wt 181.2 lb

## 2020-12-07 DIAGNOSIS — I5022 Chronic systolic (congestive) heart failure: Secondary | ICD-10-CM | POA: Insufficient documentation

## 2020-12-07 DIAGNOSIS — I5043 Acute on chronic combined systolic (congestive) and diastolic (congestive) heart failure: Secondary | ICD-10-CM

## 2020-12-07 DIAGNOSIS — Z95811 Presence of heart assist device: Secondary | ICD-10-CM | POA: Diagnosis not present

## 2020-12-07 DIAGNOSIS — I2510 Atherosclerotic heart disease of native coronary artery without angina pectoris: Secondary | ICD-10-CM

## 2020-12-07 DIAGNOSIS — Z48812 Encounter for surgical aftercare following surgery on the circulatory system: Secondary | ICD-10-CM | POA: Diagnosis present

## 2020-12-07 DIAGNOSIS — Z79899 Other long term (current) drug therapy: Secondary | ICD-10-CM

## 2020-12-07 DIAGNOSIS — I480 Paroxysmal atrial fibrillation: Secondary | ICD-10-CM | POA: Diagnosis not present

## 2020-12-07 DIAGNOSIS — Z7901 Long term (current) use of anticoagulants: Secondary | ICD-10-CM | POA: Insufficient documentation

## 2020-12-07 LAB — BASIC METABOLIC PANEL
Anion gap: 9 (ref 5–15)
BUN: 17 mg/dL (ref 6–20)
CO2: 25 mmol/L (ref 22–32)
Calcium: 9.3 mg/dL (ref 8.9–10.3)
Chloride: 104 mmol/L (ref 98–111)
Creatinine, Ser: 1.52 mg/dL — ABNORMAL HIGH (ref 0.61–1.24)
GFR, Estimated: 53 mL/min — ABNORMAL LOW (ref >60)
Glucose, Bld: 105 mg/dL — ABNORMAL HIGH (ref 70–99)
Potassium: 3.8 mmol/L (ref 3.5–5.1)
Sodium: 138 mmol/L (ref 135–145)

## 2020-12-07 LAB — PROTIME-INR
INR: 2.1 — ABNORMAL HIGH (ref 0.8–1.2)
Prothrombin Time: 23.5 seconds — ABNORMAL HIGH (ref 11.4–15.2)

## 2020-12-07 LAB — CBC
HCT: 52.1 % — ABNORMAL HIGH (ref 39.0–52.0)
Hemoglobin: 16.2 g/dL (ref 13.0–17.0)
MCH: 26 pg (ref 26.0–34.0)
MCHC: 31.1 g/dL (ref 30.0–36.0)
MCV: 83.8 fL (ref 80.0–100.0)
Platelets: 189 10*3/uL (ref 150–400)
RBC: 6.22 MIL/uL — ABNORMAL HIGH (ref 4.22–5.81)
RDW: 15.1 % (ref 11.5–15.5)
WBC: 8.8 10*3/uL (ref 4.0–10.5)
nRBC: 0 % (ref 0.0–0.2)

## 2020-12-07 LAB — LACTATE DEHYDROGENASE: LDH: 180 U/L (ref 98–192)

## 2020-12-07 MED ORDER — FUROSEMIDE 40 MG PO TABS: 40.0000 mg | ORAL_TABLET | Freq: Every day | ORAL | 6 refills | Status: DC | PRN

## 2020-12-07 NOTE — Patient Instructions (Signed)
No change in medications 2.  Coumadin dosing per Ander Purpura PharmD 3. Return to [place] clinic in 2 months for follow-up with Dr Haroldine Laws

## 2020-12-07 NOTE — Progress Notes (Signed)
Patient presents for 2 month follow up in [place] [facility] today with [name]) wife. Reports no problems or concerns with drive line.  Patient says he has been feeling great he is enjoying church every week and says he looks forward to hearing his wife sing.  He says he has no physical limitations at this time.   Pt tells me that some days he wakes up and has "fluid" specifically swelling in his abdomen. Pt states that he will take a Lasix for a couple days straight instead of just 2 every other day. Pt states that he keeps running out and the pharmacy tells him it is not time to refill script. I have sent a new script to the pharmacy for 1 tablet daily prn. Dr. Haroldine Laws would like to start [place] or Jardiance today - the cheapest of the 2 is jardiance 30 day is $45 and 90 is $135. Pt states that they cannot pay this and are opting to not try the new med.  Vital Signs:  Doppler Pressure: 80 Automatc BP: 89/62 (72) HR: 85 SR  SPO2: UTO   Weight: 181.2 lb w/o eqt Last weight: 181.4 lb  VAD Indication: Destination therapy- CKD   VAD interrogation & Equipment Management: Speed: 5400 Flow: 5 Power: 4.3w    PI: 3.4   Alarms: few low voltage advisories Events: 20 - 40  PI events daily  Fixed speed 5400 Low speed limit: 5100   New Primary Controller:  Replace back up battery in 25 months Back up controller:  Replace back up battery in 12 months   Annual Equipment Maintenance on UBC/PM was performed today on 06/08/20.    I reviewed the LVAD parameters from today and compared the results to the patient's prior recorded data. LVAD interrogation was NEGATIVE for significant power changes, negative for clinical alarms and STABLE for PI events/speed drops. No programming changes were made and pump is functioning within specified parameters. Pt is performing daily controller and system monitor self tests along with completing weekly and monthly maintenance for LVAD equipment.   LVAD equipment  check completed and is in good working order. Back up equipment present.     Exit Site Care: Drive line is being maintained weekly by [name], his wife. Exit site healed and incorporated, the velour is fully implanted at exit site. No redness, drainage or foul odor noted. Pt denies fever or chills. Pt given 8 weekly dressings, 10 anchors and adhesive remover wipes for home use.   Device: Medtronic Bi-V Therapies: on 167 Last check: 10/29/20   BP & Labs:  Doppler BP 80 - Doppler is reflecting modified systolic   Hgb [id number]- No S/S of bleeding. Specifically denies melena/BRBPR or nosebleeds.  LDH stable at 180 with established baseline of 165- 250. Denies tea-colored urine. No power elevations noted on interrogation.     Patient Instructions:  No change in medications 2.  Coumadin dosing per Ander Purpura PharmD 3. Return to [place] clinic in 2 months for follow-up with Dr Haroldine Laws   Tanda Rockers RN [place] Coordinator  Office: [phone]  24/7 Pager: [phone]

## 2020-12-07 NOTE — Progress Notes (Signed)
LVAD INR 

## 2020-12-12 NOTE — Progress Notes (Signed)
LVAD Clinic Follow Up Note HF MD: Dr Haroldine Laws  HPI: David [name] is a 55 y.o. male with history of ICD (09 PCI to LAD, mRCA, '10 PCI to Lcx and '12 mRCA), HFrEF (EF ~20% for > 5 years), HLD and HTN. S/P LVAD HM-III 04/20/18.    Admitted 04/19/17 - 05/27/2017 for scheduled LVAD implant. On 12/27 he underwent HMIII LVAD implant. Hospital course c/b PNA shock liver and AKI. Required short term HD.  Readmitted 05/30/17 hgb 3.8. INR 2.9.transfused 4u pRBCs. EGD - normal esophagus, 4 nonbleeding gastric AVMs treated with APC, normal duodenum and jejunum  Underwent DC-CV for AFL on 10/17/18   Follow up for Heart Failure/LVAD: Returns for routine VAD f/u with his wife. Says he feels great. Continue to have some days with volume overload and takes lasix until it goes back down. Denies orthopnea or PND. No fevers, chills or problems with driveline. No bleeding, melena or neuro symptoms. No VAD alarms. Taking all meds as prescribed.    VAD Indication: Destination therapy- CKD and patient choice   VAD interrogation & Equipment Management: Speed: 5400 Flow: 5 Power: 4.3w    PI: 3.4   Alarms: few low voltage advisories Events: 20 - 40  PI events daily  Fixed speed 5400 Low speed limit: 5100   New Primary Controller:  Replace back up battery in 25 months Back up controller:  Replace back up battery in 12 months   Annual Equipment Maintenance on UBC/PM was performed today on 06/08/20.    I reviewed the LVAD parameters from today and compared the results to the patient's prior recorded data. LVAD interrogation was NEGATIVE for significant power changes, negative for clinical alarms and STABLE for PI events/speed drops. No programming changes were made and pump is functioning within specified parameters. Pt is performing daily controller and system monitor self tests along with completing weekly and monthly maintenance for LVAD equipment.   LVAD equipment check completed and is in good working  order. Back up equipment present.     Exit Site Care: Drive line is being maintained weekly by [name], his wife. Exit site healed and incorporated, the velour is fully implanted at exit site. No redness, drainage or foul odor noted. Pt denies fever or chills. Pt given 8 weekly dressings, 10 anchors and adhesive remover wipes for home use.    Past Medical History:  Diagnosis Date   AICD (automatic cardioverter/defibrillator) present    Anemia    Anxiety    CAD (coronary artery disease) 2009   AMI in 12/2007 with PCI to LAD, staged PCI to  mid/distal RCA, NSTEMI in 02/2009 with BMS to LCx   CHF (congestive heart failure) ([place])    Chronic kidney disease 11/03/2016   stage 3 kidney disease   Dysrhythmia    "arrythmia problems at some point", "fatal rhythms"   History of blood transfusion    "I was bleeding from was rectum" (04/19/2017)   HLD (hyperlipidemia)    HTN (hypertension)    Ischemic cardiomyopathy    Admitted in 07/2010 with CHF exacerbation   Myocardial infarction ([place])    "I've had 4" (04/19/2017)   Pneumonia 2018 X 1   Seizures ([place])    one seizure in 04/2016 during cardiac event (04/19/2017)    Current Outpatient Medications  Medication Sig Dispense Refill   amiodarone (PACERONE) 200 MG tablet Take 0.5 tablets (100 mg total) by mouth daily. 30 tablet 5   atorvastatin (LIPITOR) 40 MG tablet Take 1 tab  daily 90 tablet 3   cetirizine (ZYRTEC ALLERGY) 10 MG tablet Take 1 tablet (10 mg total) by mouth daily. (Patient taking differently: Take 10 mg by mouth daily as needed.) 30 tablet 5   gabapentin (NEURONTIN) 300 MG capsule Take 1 capsule (300 mg total) by mouth 2 (two) times daily. 60 capsule 6   losartan (COZAAR) 50 MG tablet Take 1 tablet (50 mg total) by mouth daily. 90 tablet 3   magnesium oxide (MAG-OX) 400 MG tablet Take 1 tablet (400 mg total) by mouth 2 (two) times daily. 60 tablet 6   Misc Natural Products (ELDERBERRY IMMUNE COMPLEX) CHEW Chew by mouth.      pantoprazole (PROTONIX) 40 MG tablet Take 1 tablet (40 mg total) by mouth daily. 90 tablet 3   potassium chloride SA (KLOR-CON M20) 20 MEQ tablet Take 1 tablet (20 mEq total) by mouth daily. KEEP OV. 90 tablet 3   traZODone (DESYREL) 50 MG tablet TAKE 1 TABLET BY MOUTH AT BEDTIME 30 tablet 5   warfarin (COUMADIN) 2 MG tablet Take 4 mg (2 tabs) every Monday/Friday and 2 mg (1 tab) all other days or as directed by HF clinic 60 tablet 11   fluticasone (FLONASE) 50 MCG/ACT nasal spray Place 1 spray into both nostrils daily. (Patient not taking: Reported on 12/07/2020) 9.9 mL 4   furosemide (LASIX) 40 MG tablet Take 1 tablet (40 mg total) by mouth daily as needed. 90 tablet 6   No current facility-administered medications for this encounter.    Plavix [clopidogrel bisulfate]  Review of systems complete and found to be negative unless listed in HPI.      Vitals:   12/07/20 1315 12/07/20 1316  BP: (!) 89/62 (!) 80/0  Pulse: 85    Wt Readings from Last 3 Encounters:  12/07/20 82.2 kg (181 lb 3.2 oz)  10/05/20 82.3 kg (181 lb 6.4 oz)  08/03/20 82.3 kg (181 lb 6.4 oz)    Vital Signs:  Doppler Pressure: 80 Automatc BP: 89/62 (72) HR: 85 SR  SPO2: UTO   Weight: 181.2 lb w/o eqt Last weight: 181.4 lb   Physical Exam: General:  NAD.  HEENT: normal  Neck: supple. JVP not elevated.  Carotids 2+ bilat; no bruits. No lymphadenopathy or thryomegaly appreciated. Cor: LVAD hum.  Lungs: Clear. Abdomen: soft, nontender, non-distended. No hepatosplenomegaly. No bruits or masses. Good bowel sounds. Driveline site clean. Anchor in place.  Extremities: no cyanosis, clubbing, rash. Warm no edema  Neuro: alert & oriented x 3. No focal deficits. Moves all 4 without problem   ASSESSMENT AND PLAN:   1. Chronic systolic CHF with HM III LVAD implant 04/20/17:  - Continues to do very well with VAD support. - NYHA I - Volume status looks good today. Continues to take lasix as needed for volume overload  and managing well  - He has been [facility] at [facility] for [facility] referral but has decided not to pursue VAD. No change  2. Persistent AFL with RVR - had DC-CV on 10/17/18 with subsequent short episodes of AF after - Remains in NSR on low-dose amio. Will cotninue   3. h/o UGI AVM bleed - s/p APC x 4 lesions in 2/19 - No recurrent bleeding - Continue PPI - Off ASA due to GIB.  - Hgb stable at 16.2  4. CKD Stage IIIa:  - Creatinine baseline 1.3 - 1.7  - Creatinine 1.52 today  5. VAD management - VAD interrogated personally. Parameters stable. - LDH 180 -  INR goal 2.0-2.5 Off ASA due to GIB 2/19.  - INR 2.1 Discussed dosing with PharmD personally. - MAPs well controlled today. In past MAPs have been slightly elevated and he has not tolerated tighter control well - DL site ok   6. CAD:  s/p PCI RCA/PLOM with DES x2 and DES to mild LAD 1/18 - No s/s angina - Continue statin. Off ASA due to GIB  7. Hypomagnesemia. -  Continue mag supplements -  Recheck next visit  8. [employer] fan - unfortunately this has been incurable even with VAD implant  Total time spent 35 minutes. Over half that time spent discussing above.    Glori Bickers, MD  2:17 PM

## 2020-12-14 ENCOUNTER — Other Ambulatory Visit (HOSPITAL_COMMUNITY): Payer: Self-pay

## 2020-12-14 ENCOUNTER — Ambulatory Visit (HOSPITAL_COMMUNITY): Payer: Self-pay | Admitting: Pharmacist

## 2020-12-14 LAB — POCT INR: INR: 2.1 (ref 2.0–3.0)

## 2020-12-14 NOTE — Progress Notes (Signed)
LVAD INR 

## 2020-12-15 ENCOUNTER — Ambulatory Visit (HOSPITAL_COMMUNITY): Payer: Self-pay | Admitting: Pharmacist

## 2020-12-15 LAB — POCT INR: INR: 2.2 (ref 2.0–3.0)

## 2020-12-15 NOTE — Progress Notes (Signed)
LVAD INR 

## 2020-12-21 ENCOUNTER — Ambulatory Visit (HOSPITAL_COMMUNITY): Payer: Self-pay | Admitting: Pharmacist

## 2020-12-21 LAB — POCT INR: INR: 2.1 (ref 2.0–3.0)

## 2020-12-21 NOTE — Progress Notes (Signed)
LVAD INR 

## 2020-12-29 ENCOUNTER — Ambulatory Visit (HOSPITAL_COMMUNITY): Payer: Self-pay | Admitting: Pharmacist

## 2020-12-29 LAB — POCT INR: INR: 1.7 — AB (ref 2.0–3.0)

## 2020-12-29 NOTE — Progress Notes (Signed)
LVAD INR 

## 2021-01-04 ENCOUNTER — Ambulatory Visit (HOSPITAL_COMMUNITY): Payer: Self-pay | Admitting: Pharmacist

## 2021-01-04 LAB — POCT INR: INR: 1.6 — AB (ref 2.0–3.0)

## 2021-01-04 NOTE — Progress Notes (Signed)
LVAD INR 

## 2021-01-11 ENCOUNTER — Ambulatory Visit (HOSPITAL_COMMUNITY): Payer: Self-pay | Admitting: Pharmacist

## 2021-01-11 LAB — POCT INR: INR: 2.4 (ref 2.0–3.0)

## 2021-01-11 NOTE — Progress Notes (Signed)
LVAD INR 

## 2021-01-18 ENCOUNTER — Ambulatory Visit (HOSPITAL_COMMUNITY): Payer: Self-pay | Admitting: Pharmacist

## 2021-01-18 LAB — POCT INR: INR: 2.6 (ref 2.0–3.0)

## 2021-01-18 NOTE — Progress Notes (Signed)
LVAD INR 

## 2021-01-23 ENCOUNTER — Other Ambulatory Visit (HOSPITAL_COMMUNITY): Payer: Self-pay | Admitting: Internal Medicine

## 2021-01-23 DIAGNOSIS — Z95811 Presence of heart assist device: Secondary | ICD-10-CM

## 2021-01-25 ENCOUNTER — Ambulatory Visit (HOSPITAL_COMMUNITY): Payer: Self-pay | Admitting: Pharmacist

## 2021-01-25 LAB — POCT INR: INR: 2.4 (ref 2.0–3.0)

## 2021-01-25 NOTE — Progress Notes (Signed)
LVAD INR 

## 2021-02-01 ENCOUNTER — Ambulatory Visit (HOSPITAL_COMMUNITY): Payer: Self-pay | Admitting: Pharmacist

## 2021-02-01 LAB — POCT INR: INR: 1.6 — AB (ref 2.0–3.0)

## 2021-02-01 NOTE — Progress Notes (Signed)
LVAD INR 

## 2021-02-04 ENCOUNTER — Other Ambulatory Visit (HOSPITAL_COMMUNITY): Payer: Self-pay | Admitting: *Deleted

## 2021-02-04 ENCOUNTER — Other Ambulatory Visit (HOSPITAL_COMMUNITY): Payer: Self-pay | Admitting: Internal Medicine

## 2021-02-04 DIAGNOSIS — I5043 Acute on chronic combined systolic (congestive) and diastolic (congestive) heart failure: Secondary | ICD-10-CM

## 2021-02-04 DIAGNOSIS — I5022 Chronic systolic (congestive) heart failure: Secondary | ICD-10-CM

## 2021-02-04 DIAGNOSIS — Z95811 Presence of heart assist device: Secondary | ICD-10-CM

## 2021-02-04 DIAGNOSIS — G4700 Insomnia, unspecified: Secondary | ICD-10-CM

## 2021-02-04 DIAGNOSIS — Z7901 Long term (current) use of anticoagulants: Secondary | ICD-10-CM

## 2021-02-04 MED ORDER — FUROSEMIDE 40 MG PO TABS: 40.0000 mg | ORAL_TABLET | Freq: Every day | ORAL | 6 refills | Status: DC | PRN

## 2021-02-08 ENCOUNTER — Other Ambulatory Visit (HOSPITAL_COMMUNITY): Payer: Self-pay | Admitting: *Deleted

## 2021-02-08 ENCOUNTER — Encounter (HOSPITAL_COMMUNITY): Payer: Self-pay

## 2021-02-08 ENCOUNTER — Ambulatory Visit (HOSPITAL_COMMUNITY): Payer: Self-pay | Admitting: Pharmacist

## 2021-02-08 ENCOUNTER — Other Ambulatory Visit: Payer: Self-pay

## 2021-02-08 ENCOUNTER — Ambulatory Visit (HOSPITAL_COMMUNITY)
Admission: RE | Admit: 2021-02-08 | Discharge: 2021-02-08 | Disposition: A | Discharge status: Home / Self Care | Payer: Medicare HMO | Source: Ambulatory Visit | Attending: Cardiology | Admitting: Cardiology

## 2021-02-08 VITALS — BP 124/47 | HR 121 | Wt 181.4 lb

## 2021-02-08 DIAGNOSIS — Z95811 Presence of heart assist device: Secondary | ICD-10-CM

## 2021-02-08 DIAGNOSIS — I480 Paroxysmal atrial fibrillation: Secondary | ICD-10-CM

## 2021-02-08 DIAGNOSIS — Z95810 Presence of automatic (implantable) cardiac defibrillator: Secondary | ICD-10-CM | POA: Insufficient documentation

## 2021-02-08 DIAGNOSIS — N1831 Chronic kidney disease, stage 3a: Secondary | ICD-10-CM | POA: Diagnosis not present

## 2021-02-08 DIAGNOSIS — E785 Hyperlipidemia, unspecified: Secondary | ICD-10-CM | POA: Diagnosis not present

## 2021-02-08 DIAGNOSIS — Z7901 Long term (current) use of anticoagulants: Secondary | ICD-10-CM

## 2021-02-08 DIAGNOSIS — Z79899 Other long term (current) drug therapy: Secondary | ICD-10-CM | POA: Diagnosis not present

## 2021-02-08 DIAGNOSIS — Z955 Presence of coronary angioplasty implant and graft: Secondary | ICD-10-CM | POA: Insufficient documentation

## 2021-02-08 DIAGNOSIS — Z8719 Personal history of other diseases of the digestive system: Secondary | ICD-10-CM | POA: Insufficient documentation

## 2021-02-08 DIAGNOSIS — I130 Hypertensive heart and chronic kidney disease with heart failure and stage 1 through stage 4 chronic kidney disease, or unspecified chronic kidney disease: Secondary | ICD-10-CM | POA: Insufficient documentation

## 2021-02-08 DIAGNOSIS — I252 Old myocardial infarction: Secondary | ICD-10-CM | POA: Insufficient documentation

## 2021-02-08 DIAGNOSIS — I5022 Chronic systolic (congestive) heart failure: Secondary | ICD-10-CM | POA: Diagnosis not present

## 2021-02-08 DIAGNOSIS — Z09 Encounter for follow-up examination after completed treatment for conditions other than malignant neoplasm: Secondary | ICD-10-CM | POA: Insufficient documentation

## 2021-02-08 DIAGNOSIS — I2510 Atherosclerotic heart disease of native coronary artery without angina pectoris: Secondary | ICD-10-CM

## 2021-02-08 DIAGNOSIS — I10 Essential (primary) hypertension: Secondary | ICD-10-CM

## 2021-02-08 DIAGNOSIS — I4892 Unspecified atrial flutter: Secondary | ICD-10-CM

## 2021-02-08 LAB — BASIC METABOLIC PANEL
Anion gap: 11 (ref 5–15)
BUN: 14 mg/dL (ref 6–20)
CO2: 25 mmol/L (ref 22–32)
Calcium: 9.3 mg/dL (ref 8.9–10.3)
Chloride: 103 mmol/L (ref 98–111)
Creatinine, Ser: 1.53 mg/dL — ABNORMAL HIGH (ref 0.61–1.24)
GFR, Estimated: 53 mL/min — ABNORMAL LOW (ref >60)
Glucose, Bld: 100 mg/dL — ABNORMAL HIGH (ref 70–99)
Potassium: 4.1 mmol/L (ref 3.5–5.1)
Sodium: 139 mmol/L (ref 135–145)

## 2021-02-08 LAB — LACTATE DEHYDROGENASE: LDH: 188 U/L (ref 98–192)

## 2021-02-08 LAB — CBC
HCT: 51.8 % (ref 39.0–52.0)
Hemoglobin: 16.3 g/dL (ref 13.0–17.0)
MCH: 26 pg (ref 26.0–34.0)
MCHC: 31.5 g/dL (ref 30.0–36.0)
MCV: 82.5 fL (ref 80.0–100.0)
Platelets: 181 10*3/uL (ref 150–400)
RBC: 6.28 MIL/uL — ABNORMAL HIGH (ref 4.22–5.81)
RDW: 14.4 % (ref 11.5–15.5)
WBC: 8.5 10*3/uL (ref 4.0–10.5)
nRBC: 0 % (ref 0.0–0.2)

## 2021-02-08 LAB — PROTIME-INR
INR: 2.5 — ABNORMAL HIGH (ref 0.8–1.2)
Prothrombin Time: 27.3 seconds — ABNORMAL HIGH (ref 11.4–15.2)

## 2021-02-08 MED ORDER — AMIODARONE HCL 200 MG PO TABS: 200.0000 mg | ORAL_TABLET | Freq: Two times a day (BID) | ORAL | 5 refills | Status: DC

## 2021-02-08 NOTE — Progress Notes (Signed)
LVAD INR 

## 2021-02-08 NOTE — Patient Instructions (Addendum)
Increase Amiodarone to 200 mg twice daily Coumadin dosing per Ander Purpura PharmD Return to [facility] clinic in 2 weeks for follow up with Dr Haroldine Laws Scheduled for cardioversion with TEE on Wednesday at 0830. You need to check in at admitting by 0630. Nothing to eat or drink after midnight on Tuesday night.

## 2021-02-08 NOTE — Progress Notes (Signed)
Patient presents for 2 month follow up in [facility] today with David [name]) wife. Reports no problems or concerns with drive line.  Patient says he has been feeling great. Denies lightheadedness, dizziness, falls, shortness of breath, and signs of bleeding.   Reports intermittent right lower back pain that radiates into his leg. Believes this is due to his herniated disc. Offered to refer him to an orthopedic specialist, but he declines need at this time. He says he has no physical limitations at this time.   Reports he is taking Lasix 40 mg 3-4 days per week for "bloating" in his abdomen. Reports good urine output. Weight stable. Denies shortness of breath. Reports cough that he attributes to sinus issues for the last week.   Placed patient on the monitor in clinic. HR 122; asymptomatic. EKG completed. Ivan [employer] rep to bedside to interrogate ICD. Confirmed pt is in 2:1 atrial flutter for the last 3 weeks. Attempted unsuccessfully to pace patient out of atrial flutter. Per Dr Haroldine Laws will schedule for TEE with cardioversion this week. Will increase Amiodarone to 200 mg BID. Prescription sent in to patient's pharmacy.   Pt with cracked black lead on primary controller. Thinks this occurred when he was out to eat "sitting on a really hard chair." Replaced primary controller today: [id number] Manufacture: 11/28/20 Expiration: 11/06/22     Vital Signs:  Doppler Pressure: 116 Automatc BP: 124/47 (92) HR: 122 atrial flutter   SPO2: 95%    Weight: 181.4 lb w/o eqt Last weight: 181.2 lb  VAD Indication: Destination therapy- CKD   VAD interrogation & Equipment Management: Speed: 5400 [name]: 4.0 Power: 4.1w    PI: 5.6   Alarms: 1 LOW [name] on 10/6 at 0428. Pt reports he thinks he was laying on his drive line, bending it, in bed Events: 20 - 40  PI events daily  Fixed speed 5400 Low speed limit: 5100   New Primary Controller:  Replace back up battery in 32 months Back up controller:   Replace back up battery in 12 months   Annual Equipment Maintenance on UBC/PM was performed today on 06/08/20.    I reviewed the LVAD parameters from today and compared the results to the patient's prior recorded data. LVAD interrogation was NEGATIVE for significant power changes, negative for clinical alarms and STABLE for PI events/speed drops. No programming changes were made and pump is functioning within specified parameters. Pt is performing daily controller and system monitor self tests along with completing weekly and monthly maintenance for LVAD equipment.   LVAD equipment check completed and is in good working order. Back up equipment present.     Exit Site Care: Drive line is being maintained weekly by David [name], his wife. Exit site healed and incorporated, the velour is fully implanted at exit site. No redness, drainage or foul odor noted. Pt denies fever or chills. Pt given 8 weekly dressings for home use.  Device: [employer] Bi-V Therapies: on 167 Last check: 10/29/20   BP & Labs:  Doppler BP 116 - Doppler is reflecting modified systolic   Hgb 123XX123- No S/S of bleeding. Specifically denies melena/BRBPR or nosebleeds.  LDH stable at 188 with established baseline of 165- 250. Denies tea-colored urine. No power elevations noted on interrogation.     Patient Instructions:  Increase Amiodarone to 200 mg twice daily Coumadin dosing per Ander Purpura PharmD Return to [place] clinic in 2 weeks for follow up with Dr Haroldine Laws Scheduled for cardioversion with TEE on Wednesday at 0830. You  need to check in at admitting by 0630. Nothing to eat or drink after midnight on Tuesday night.   Emerson Monte RN [facility] Coordinator  Office: [phone]  24/7 Pager: [phone]

## 2021-02-09 ENCOUNTER — Telehealth (HOSPITAL_COMMUNITY): Payer: Self-pay | Admitting: *Deleted

## 2021-02-09 NOTE — Progress Notes (Addendum)
LVAD Clinic Follow Up Note HF MD: Dr Haroldine Laws  HPI: David [name] is a 55 y.o. male with history of ICD (09 PCI to LAD, mRCA, '10 PCI to Lcx and '12 mRCA), HFrEF (EF ~20% for > 5 years), HLD and HTN. S/P LVAD HM-III 04/20/18.    Admitted 04/19/17 - 05/27/2017 for scheduled LVAD implant. On 12/27 he underwent HMIII LVAD implant. Hospital course c/b PNA shock liver and AKI. Required short term HD.  Readmitted 05/30/17 hgb 3.8. INR 2.9.transfused 4u pRBCs. 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 Says he is feeling very good. But for past several weeks has felt more bloated and had to take lasix 2-3x/week. Denies orthopnea or PND. No fevers, chills or problems with driveline. No bleeding, melena or neuro symptoms. No VAD alarms. Taking all meds as prescribed.     VAD Indication: Destination therapy- CKD   VAD interrogation & Equipment Management: Speed: 5400 Flow: 4.0 Power: 4.1w    PI: 5.6   Alarms: 1 LOW FLOW on 10/6 at 0428. Pt reports he thinks he was laying on his drive line, bending it, in bed Events: 20 - 40  PI events daily  Fixed speed 5400 Low speed limit: 5100   New Primary Controller:  Replace back up battery in 32 months Back up controller:  Replace back up battery in 12 months   Annual Equipment Maintenance on UBC/PM was performed today on 06/08/20.    I reviewed the LVAD parameters from today and compared the results to the patient's prior recorded data. LVAD interrogation was NEGATIVE for significant power changes, negative for clinical alarms and STABLE for PI events/speed drops. No programming changes were made and pump is functioning within specified parameters. Pt is performing daily controller and system monitor self tests along with completing weekly and monthly maintenance for LVAD  equipment.   LVAD equipment check completed and is in good working order. Back up equipment present.     Exit Site Care: Drive line is being maintained weekly by [name], his wife. Exit site healed and incorporated, the velour is fully implanted at exit site. No redness, drainage or foul odor noted. Pt denies fever or chills. Pt given 8 weekly dressings, 10 anchors and adhesive remover wipes for home use.    Past Medical History:  Diagnosis Date   AICD (automatic cardioverter/defibrillator) present    Anemia    Anxiety    CAD (coronary artery disease) 2009   AMI in 12/2007 with PCI to LAD, staged PCI to  mid/distal RCA, NSTEMI in 02/2009 with BMS to LCx   CHF (congestive heart failure) ([place])    Chronic kidney disease 11/03/2016   stage 3 kidney disease   Dysrhythmia    "arrythmia problems at some point", "fatal rhythms"   History of blood transfusion    "I was bleeding from was rectum" (04/19/2017)   HLD (hyperlipidemia)    HTN (hypertension)    Ischemic cardiomyopathy    Admitted in 07/2010 with CHF exacerbation   Myocardial infarction ([place])    "I've had 4" (04/19/2017)   Pneumonia 2018 X 1   Seizures ([place])    one seizure in 04/2016 during cardiac event (04/19/2017)    Current Outpatient Medications  Medication Sig Dispense Refill   atorvastatin (LIPITOR) 40 MG tablet Take 1 tablet by  mouth once daily 90 tablet 0   cetirizine (ZYRTEC ALLERGY) 10 MG tablet Take 1 tablet (10 mg total) by mouth daily. (Patient not taking: No sig reported) 30 tablet 5   furosemide (LASIX) 40 MG tablet Take 1 tablet (40 mg total) by mouth daily as needed. 90 tablet 6   gabapentin (NEURONTIN) 300 MG capsule Take 1 capsule (300 mg total) by mouth 2 (two) times daily. 60 capsule 6   losartan (COZAAR) 50 MG tablet Take 1 tablet (50 mg total) by mouth daily. 90 tablet 3   magnesium oxide (MAG-OX) 400 MG tablet Take 1 tablet (400 mg total) by mouth 2 (two) times daily. 60 tablet 6   Misc Natural Products  (ELDERBERRY IMMUNE COMPLEX) CHEW Chew 2 capsules by mouth daily.     pantoprazole (PROTONIX) 40 MG tablet Take 1 tablet (40 mg total) by mouth daily. 90 tablet 3   potassium chloride SA (KLOR-CON M20) 20 MEQ tablet Take 1 tablet (20 mEq total) by mouth daily. KEEP OV. 90 tablet 3   traZODone (DESYREL) 50 MG tablet TAKE 1 TABLET BY MOUTH ONCE DAILY AT BEDTIME 30 tablet 6   warfarin (COUMADIN) 2 MG tablet TAKE 2 TABS BY MOUTH EVERY MONDAY AND FRIDAY, AND TAKE 1 TAB ALL OTHER DAYS OR AS DIRECTED BY HF CLINIC 60 tablet 0   amiodarone (PACERONE) 200 MG tablet Take 1 tablet (200 mg total) by mouth 2 (two) times daily. 60 tablet 5   fluticasone (FLONASE) 50 MCG/ACT nasal spray Place 1 spray into both nostrils daily. (Patient taking differently: Place 1 spray into both nostrils daily as needed for allergies.) 9.9 mL 4   oxyCODONE (OXY IR/ROXICODONE) 5 MG immediate release tablet Take 5 mg by mouth daily as needed for severe pain.     No current facility-administered medications for this encounter.    Plavix [clopidogrel bisulfate]  Review of systems complete and found to be negative unless listed in HPI.      Vitals:   02/08/21 1037 02/08/21 1038  BP: (!) 116/0 (!) 124/47  Pulse: (!) 121   SpO2: 95%    Wt Readings from Last 3 Encounters:  02/08/21 82.3 kg (181 lb 6.4 oz)  12/07/20 82.2 kg (181 lb 3.2 oz)  10/05/20 82.3 kg (181 lb 6.4 oz)     Vital Signs:  Doppler Pressure: 116 Automatc BP: 124/47 (92) HR: 122 atrial flutter   SPO2: 95%    Weight: 181.4 lb w/o eqt Last weight: 181.2 lb     Physical Exam: General:  NAD.  HEENT: normal  Neck: supple. JVP not elevated.  Carotids 2+ bilat; no bruits. No lymphadenopathy or thryomegaly appreciated. Cor: LVAD hum.  Lungs: Clear. Abdomen: soft, nontender, non-distended. No hepatosplenomegaly. No bruits or masses. Good bowel sounds. Driveline site clean. Anchor in place.  Extremities: no cyanosis, clubbing, rash. Warm trace edema   Neuro: alert & oriented x 3. No focal deficits. Moves all 4 without problem   ASSESSMENT AND PLAN:  1. Recurrent AFL with RVR - had DC-CV on 10/17/18 with subsequent short episodes of AF after but has been mostly in NSR on low-dose amio (100 daily) - he is back in AFL today with RVR - ICD interrogated in Clinic personally and has been in AFL for about 3 weeks.  - We attempted to pace out in clinic today but failed - Increase amio to 200 bid  - Will plan DC-CV this week. On looking back had an INR of 1.6 last week (  now 2.5). So will plan TEE/DC-CV   2. Chronic systolic CHF with HM III LVAD implant 04/20/17:  - Continues to do very well with VAD support. - NYHA I - Volume status has been more elevated recently with AFL. Continue prn lasix. Plan DC-CV as above - He has been seen at [facility] for transplant referral but has decided not to pursue VAD. No change  3. h/o UGI AVM bleed - s/p APC x 4 lesions in 2/19 - No recurrent bleeding  - Continue PPI - Off ASA due to GIB.  - Hgb stable at 16.3  4. CKD Stage IIIa:  - Creatinine baseline 1.3 - 1.7  - Creatinine 1.53 today  5. VAD management - VAD interrogated personally. Parameters stable. - LDH 188 - INR goal 2.0-2.5 Off ASA due to GIB 2/19.  - INR 2.5 Discussed dosing with PharmD personally. - MAPs ok. In past MAPs have been slightly elevated and he has not tolerated tighter control well - DL site ok - Black battery clip was broken. Replaced today in clinic   6. CAD:  s/p PCI RCA/PLOM with DES x2 and DES to mild LAD 1/18 - No s/s angina - Continue statin. Off ASA due to GIB  7. Hypomagnesemia. -  Continue mag supplements  8. [employer] fan - unfortunately this has been incurable even with VAD implant  Total time spent 45 minutes. Over half that time spent discussing above.    Glori Bickers, MD  6:27 PM

## 2021-02-09 NOTE — Telephone Encounter (Signed)
-----   Message from Mertha Baars, RN sent at 02/08/2021 11:19 AM EDT ----- Regarding: PA Hey!   Does Mr Oetken need PA for TEE and cardioversion scheduled for 10/19?   Thanks, Ebony Hail

## 2021-02-10 ENCOUNTER — Encounter (HOSPITAL_COMMUNITY): Payer: Self-pay | Admitting: Internal Medicine

## 2021-02-10 ENCOUNTER — Ambulatory Visit (HOSPITAL_COMMUNITY): Payer: Medicare HMO

## 2021-02-10 ENCOUNTER — Encounter (HOSPITAL_COMMUNITY): Payer: Self-pay | Admitting: Certified Registered Nurse Anesthetist

## 2021-02-10 ENCOUNTER — Ambulatory Visit (HOSPITAL_COMMUNITY)
Admission: RE | Admit: 2021-02-10 | Discharge: 2021-02-10 | Disposition: A | Discharge status: Home / Self Care | Payer: Medicare HMO | Attending: Internal Medicine | Admitting: Internal Medicine

## 2021-02-10 ENCOUNTER — Encounter (HOSPITAL_COMMUNITY)
Admission: RE | Disposition: A | Discharge status: Home / Self Care | Payer: Self-pay | Source: Home / Self Care | Attending: Internal Medicine

## 2021-02-10 DIAGNOSIS — Z538 Procedure and treatment not carried out for other reasons: Secondary | ICD-10-CM | POA: Insufficient documentation

## 2021-02-10 DIAGNOSIS — I4892 Unspecified atrial flutter: Secondary | ICD-10-CM | POA: Insufficient documentation

## 2021-02-10 SURGERY — CANCELLED PROCEDURE

## 2021-02-10 NOTE — Progress Notes (Signed)
Patient in NSR on monitor, EKG done, NSR. Pacemaker interrogated and showed NSR. Dr Haroldine Laws notified and said to cancel case and dischare to home.

## 2021-02-10 NOTE — Anesthesia Preprocedure Evaluation (Deleted)
Anesthesia Evaluation    Reviewed: Allergy & Precautions, Patient's Chart, lab work & pertinent test results  Airway        Dental   Pulmonary former smoker,           Cardiovascular hypertension, + CAD, + Past MI, + Cardiac Stents and +CHF  + dysrhythmias + Cardiac Defibrillator   - s/p AVR, LVAD placement  Echo (2020): 1. LVAD apical cannula well positioned.  2. The right ventricle has severely reduced systolic function.  3. Left atrial size was moderately dilated.  4. No evidence of a thrombus present in the left atrial appendage.  5. 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  6. The left ventricle had a visually estimated ejection fraction of of  10%. Left ventricular diffuse hypokinesis.    Neuro/Psych Seizures -,  Anxiety    GI/Hepatic negative GI ROS, Neg liver ROS,   Endo/Other    Renal/GU Renal disease     Musculoskeletal   Abdominal   Peds  Hematology   Anesthesia Other Findings   Reproductive/Obstetrics                             Anesthesia Physical Anesthesia Plan  ASA: 4  Anesthesia Plan: MAC   Post-op Pain Management:    Induction: Intravenous  PONV Risk Score and Plan: 0 and Propofol infusion  Airway Management Planned: Natural Airway and Simple Face Mask  Additional Equipment: None  Intra-op Plan:   Post-operative Plan:   Informed Consent:   Plan Discussed with: CRNA  Anesthesia Plan Comments: (Cancelled due to NSR)       Anesthesia Quick Evaluation

## 2021-02-15 ENCOUNTER — Ambulatory Visit (HOSPITAL_COMMUNITY): Payer: Self-pay | Admitting: Pharmacist

## 2021-02-15 LAB — POCT INR: INR: 2.7 (ref 2.0–3.0)

## 2021-02-15 NOTE — Progress Notes (Signed)
LVAD INR 

## 2021-02-22 ENCOUNTER — Ambulatory Visit (HOSPITAL_COMMUNITY): Payer: Self-pay | Admitting: Pharmacist

## 2021-02-22 LAB — POCT INR: INR: 2.4 (ref 2.0–3.0)

## 2021-02-22 NOTE — Progress Notes (Signed)
LVAD INR 

## 2021-02-24 ENCOUNTER — Encounter (HOSPITAL_COMMUNITY): Payer: Medicare HMO

## 2021-03-01 ENCOUNTER — Ambulatory Visit (HOSPITAL_COMMUNITY): Payer: Self-pay | Admitting: Pharmacist

## 2021-03-01 LAB — POCT INR: INR: 2.5 (ref 2.0–3.0)

## 2021-03-01 NOTE — Progress Notes (Signed)
LVAD INR 

## 2021-03-02 ENCOUNTER — Other Ambulatory Visit (HOSPITAL_COMMUNITY): Payer: Self-pay | Admitting: *Deleted

## 2021-03-02 ENCOUNTER — Other Ambulatory Visit (HOSPITAL_COMMUNITY): Payer: Self-pay | Admitting: Internal Medicine

## 2021-03-02 DIAGNOSIS — I5043 Acute on chronic combined systolic (congestive) and diastolic (congestive) heart failure: Secondary | ICD-10-CM

## 2021-03-02 DIAGNOSIS — Z95811 Presence of heart assist device: Secondary | ICD-10-CM

## 2021-03-02 DIAGNOSIS — G629 Polyneuropathy, unspecified: Secondary | ICD-10-CM

## 2021-03-02 MED ORDER — WARFARIN SODIUM 2 MG PO TABS: ORAL_TABLET | ORAL | 6 refills | Status: DC

## 2021-03-02 MED ORDER — POTASSIUM CHLORIDE CRYS ER 20 MEQ PO TBCR: 20.0000 meq | EXTENDED_RELEASE_TABLET | Freq: Every day | ORAL | 3 refills | Status: DC

## 2021-03-02 MED ORDER — GABAPENTIN 300 MG PO CAPS
300.0000 mg | ORAL_CAPSULE | Freq: Two times a day (BID) | ORAL | 6 refills | Status: DC
Start: 2021-03-02 — End: 2021-07-06

## 2021-03-03 ENCOUNTER — Other Ambulatory Visit (HOSPITAL_COMMUNITY): Payer: Self-pay | Admitting: *Deleted

## 2021-03-03 DIAGNOSIS — I5043 Acute on chronic combined systolic (congestive) and diastolic (congestive) heart failure: Secondary | ICD-10-CM

## 2021-03-03 DIAGNOSIS — Z95811 Presence of heart assist device: Secondary | ICD-10-CM

## 2021-03-03 MED ORDER — POTASSIUM CHLORIDE CRYS ER 20 MEQ PO TBCR
20.0000 meq | EXTENDED_RELEASE_TABLET | Freq: Every day | ORAL | 3 refills | Status: DC
Start: 2021-03-03 — End: 2021-06-28

## 2021-03-08 ENCOUNTER — Ambulatory Visit (HOSPITAL_COMMUNITY): Payer: Self-pay | Admitting: Pharmacist

## 2021-03-08 LAB — POCT INR: INR: 3.1 — AB (ref 2.0–3.0)

## 2021-03-08 NOTE — Progress Notes (Signed)
LVAD INR 

## 2021-03-15 ENCOUNTER — Ambulatory Visit (HOSPITAL_COMMUNITY): Payer: Self-pay | Admitting: Pharmacist

## 2021-03-15 LAB — POCT INR: INR: 2.6 (ref 2.0–3.0)

## 2021-03-15 NOTE — Progress Notes (Signed)
LVAD INR 

## 2021-03-22 ENCOUNTER — Ambulatory Visit (HOSPITAL_COMMUNITY): Payer: Self-pay | Admitting: Pharmacist

## 2021-03-22 LAB — POCT INR: INR: 3.2 — AB (ref 2.0–3.0)

## 2021-03-22 NOTE — Progress Notes (Signed)
LVAD INR 

## 2021-04-05 ENCOUNTER — Ambulatory Visit (HOSPITAL_COMMUNITY): Payer: Self-pay | Admitting: Pharmacist

## 2021-04-05 LAB — POCT INR: INR: 3.5 — AB (ref 2.0–3.0)

## 2021-04-05 NOTE — Progress Notes (Signed)
LVAD INR 

## 2021-04-12 ENCOUNTER — Ambulatory Visit (HOSPITAL_COMMUNITY): Payer: Self-pay | Admitting: Pharmacist

## 2021-04-12 LAB — POCT INR: INR: 2.7 (ref 2.0–3.0)

## 2021-04-12 NOTE — Progress Notes (Signed)
LVAD INR 

## 2021-04-20 ENCOUNTER — Ambulatory Visit (HOSPITAL_COMMUNITY): Payer: Self-pay | Admitting: Pharmacist

## 2021-04-20 LAB — POCT INR: INR: 2.6 (ref 2.0–3.0)

## 2021-04-20 NOTE — Progress Notes (Signed)
LVAD INR 

## 2021-04-27 ENCOUNTER — Ambulatory Visit (HOSPITAL_COMMUNITY): Payer: Self-pay | Admitting: Pharmacist

## 2021-04-27 LAB — POCT INR: INR: 3.2 — AB (ref 2.0–3.0)

## 2021-04-27 NOTE — Progress Notes (Signed)
LVAD INR 

## 2021-04-28 ENCOUNTER — Ambulatory Visit (INDEPENDENT_AMBULATORY_CARE_PROVIDER_SITE_OTHER): Payer: Medicare HMO

## 2021-04-28 DIAGNOSIS — I255 Ischemic cardiomyopathy: Secondary | ICD-10-CM | POA: Diagnosis not present

## 2021-04-29 ENCOUNTER — Telehealth: Payer: Self-pay

## 2021-04-29 LAB — CUP PACEART REMOTE DEVICE CHECK
Battery Remaining Longevity: 41 mo
Battery Voltage: 2.97 V
Brady Statistic AP VP Percent: 0 %
Brady Statistic AP VS Percent: 0 %
Brady Statistic AS VP Percent: 0.02 %
Brady Statistic AS VS Percent: 99.98 %
Brady Statistic RA Percent Paced: 0 %
Brady Statistic RV Percent Paced: 0.03 %
Date Time Interrogation Session: 20230105132124
HighPow Impedance: 71 Ohm
Implantable Lead Implant Date: 20180409
Implantable Lead Implant Date: 20180409
Implantable Lead Implant Date: 20180716
Implantable Lead Implant Date: 20180716
Implantable Lead Location: 753858
Implantable Lead Location: 753858
Implantable Lead Location: 753859
Implantable Lead Location: 753860
Implantable Lead Model: 5076
Implantable Lead Model: 511212
Implantable Lead Model: 511212
Implantable Lead Serial Number: 263773
Implantable Lead Serial Number: 263775
Implantable Pulse Generator Implant Date: 20180409
Lead Channel Impedance Value: 304 Ohm
Lead Channel Impedance Value: 3648 Ohm
Lead Channel Impedance Value: 380 Ohm
Lead Channel Impedance Value: 399 Ohm
Lead Channel Impedance Value: 4047 Ohm
Lead Channel Impedance Value: 912 Ohm
Lead Channel Pacing Threshold Amplitude: 0.625 V
Lead Channel Pacing Threshold Amplitude: 0.875 V
Lead Channel Pacing Threshold Amplitude: 1.75 V
Lead Channel Pacing Threshold Pulse Width: 0.4 ms
Lead Channel Pacing Threshold Pulse Width: 0.4 ms
Lead Channel Pacing Threshold Pulse Width: 1 ms
Lead Channel Sensing Intrinsic Amplitude: 13.875 mV
Lead Channel Sensing Intrinsic Amplitude: 13.875 mV
Lead Channel Sensing Intrinsic Amplitude: 3 mV
Lead Channel Sensing Intrinsic Amplitude: 3 mV
Lead Channel Setting Pacing Amplitude: 2.5 V
Lead Channel Setting Pacing Pulse Width: 0.4 ms
Lead Channel Setting Sensing Sensitivity: 0.6 mV

## 2021-04-29 NOTE — Telephone Encounter (Signed)
Pt wife states the patient monitor is not working. I gave her the number to [facility] support to get additional help with his monitor.

## 2021-04-30 ENCOUNTER — Other Ambulatory Visit (HOSPITAL_COMMUNITY): Payer: Self-pay | Admitting: Unknown Physician Specialty

## 2021-04-30 DIAGNOSIS — Z7901 Long term (current) use of anticoagulants: Secondary | ICD-10-CM

## 2021-04-30 DIAGNOSIS — Z95811 Presence of heart assist device: Secondary | ICD-10-CM

## 2021-05-03 ENCOUNTER — Ambulatory Visit (HOSPITAL_COMMUNITY): Payer: Self-pay | Admitting: Pharmacist

## 2021-05-03 ENCOUNTER — Ambulatory Visit (HOSPITAL_COMMUNITY)
Admission: RE | Admit: 2021-05-03 | Discharge: 2021-05-03 | Disposition: A | Discharge status: Home / Self Care | Payer: Medicare HMO | Source: Ambulatory Visit | Attending: Cardiology | Admitting: Cardiology

## 2021-05-03 ENCOUNTER — Other Ambulatory Visit: Payer: Self-pay

## 2021-05-03 VITALS — BP 90/0 | HR 79 | Ht 65.0 in | Wt 183.2 lb

## 2021-05-03 DIAGNOSIS — I4892 Unspecified atrial flutter: Secondary | ICD-10-CM | POA: Diagnosis not present

## 2021-05-03 DIAGNOSIS — I10 Essential (primary) hypertension: Secondary | ICD-10-CM

## 2021-05-03 DIAGNOSIS — Z4502 Encounter for adjustment and management of automatic implantable cardiac defibrillator: Secondary | ICD-10-CM | POA: Diagnosis present

## 2021-05-03 DIAGNOSIS — Z7901 Long term (current) use of anticoagulants: Secondary | ICD-10-CM | POA: Diagnosis not present

## 2021-05-03 DIAGNOSIS — I2510 Atherosclerotic heart disease of native coronary artery without angina pectoris: Secondary | ICD-10-CM

## 2021-05-03 DIAGNOSIS — Z95811 Presence of heart assist device: Secondary | ICD-10-CM | POA: Insufficient documentation

## 2021-05-03 DIAGNOSIS — Z5181 Encounter for therapeutic drug level monitoring: Secondary | ICD-10-CM | POA: Insufficient documentation

## 2021-05-03 LAB — COMPREHENSIVE METABOLIC PANEL
ALT: 28 U/L (ref 0–44)
AST: 28 U/L (ref 15–41)
Albumin: 3.6 g/dL (ref 3.5–5.0)
Alkaline Phosphatase: 106 U/L (ref 38–126)
Anion gap: 12 (ref 5–15)
BUN: 16 mg/dL (ref 6–20)
CO2: 21 mmol/L — ABNORMAL LOW (ref 22–32)
Calcium: 9 mg/dL (ref 8.9–10.3)
Chloride: 104 mmol/L (ref 98–111)
Creatinine, Ser: 1.87 mg/dL — ABNORMAL HIGH (ref 0.61–1.24)
GFR, Estimated: 41 mL/min — ABNORMAL LOW (ref >60)
Glucose, Bld: 88 mg/dL (ref 70–99)
Potassium: 4.1 mmol/L (ref 3.5–5.1)
Sodium: 137 mmol/L (ref 135–145)
Total Bilirubin: 0.6 mg/dL (ref 0.3–1.2)
Total Protein: 7.9 g/dL (ref 6.5–8.1)

## 2021-05-03 LAB — CBC
HCT: 51.3 % (ref 39.0–52.0)
Hemoglobin: 16.3 g/dL (ref 13.0–17.0)
MCH: 26.5 pg (ref 26.0–34.0)
MCHC: 31.8 g/dL (ref 30.0–36.0)
MCV: 83.6 fL (ref 80.0–100.0)
Platelets: 207 10*3/uL (ref 150–400)
RBC: 6.14 MIL/uL — ABNORMAL HIGH (ref 4.22–5.81)
RDW: 14.9 % (ref 11.5–15.5)
WBC: 9.8 10*3/uL (ref 4.0–10.5)
nRBC: 0 % (ref 0.0–0.2)

## 2021-05-03 LAB — PROTIME-INR
INR: 2.6 — ABNORMAL HIGH (ref 0.8–1.2)
Prothrombin Time: 28.1 seconds — ABNORMAL HIGH (ref 11.4–15.2)

## 2021-05-03 LAB — LACTATE DEHYDROGENASE: LDH: 207 U/L — ABNORMAL HIGH (ref 98–192)

## 2021-05-03 LAB — PREALBUMIN: Prealbumin: 36.5 mg/dL (ref 18–38)

## 2021-05-03 MED ORDER — AMIODARONE HCL 200 MG PO TABS: 200.0000 mg | ORAL_TABLET | Freq: Every day | ORAL | 5 refills | Status: DC

## 2021-05-03 NOTE — Patient Instructions (Signed)
Decrease Amiodarone to 200 mg daily Return to clinic in 2 months

## 2021-05-03 NOTE — Progress Notes (Addendum)
LVAD Clinic Follow Up Note HF MD: Dr Haroldine Laws  HPI: David [name] is a 56 y.o. male with history of ICD (09 PCI to LAD, mRCA, '10 PCI to Lcx and '12 mRCA), HFrEF (EF ~20% for > 5 years), HLD and HTN. S/P LVAD HM-III 04/20/18.    Admitted 04/19/17 - 05/27/2017 for scheduled LVAD implant. On 12/27 he underwent HMIII LVAD implant. Hospital course c/b PNA shock liver and AKI. Required short term HD.  Readmitted 05/30/17 hgb 3.8. INR 2.9.transfused 4u pRBCs. 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 We saw him in 10/22 and was back in AF. Amio increased to 200 bid. Failed attempt at pace termination. Brought in for DC-CV but had converted back to NSR with amio. Feels good.  Denies orthopnea or PND. No fevers, chills or problems with driveline. No bleeding, melena or neuro symptoms. No VAD alarms. Taking all meds as prescribed.    VAD Indication: Destination therapy- CKD   VAD interrogation & Equipment Management: Speed: 5400 [name]: 5.0 Power: 4.2w    PI: 5.6   Alarms: 1 LOW [name] on 10/6 at 0428. Pt reports he thinks he was laying on his drive line, bending it, in bed Events: 20 - 40  PI events daily  Fixed speed 5400 Low speed limit: 5100   New Primary Controller:  Replace back up battery in 32 months Back up controller:  Replace back up battery in 12 months   Annual Equipment Maintenance on UBC/PM was performed today on 06/08/20.    I reviewed the LVAD parameters from today and compared the results to the patient's prior recorded data. LVAD interrogation was NEGATIVE for significant power changes, negative for clinical alarms and STABLE for PI events/speed drops. No programming changes were made and pump is functioning within specified parameters. Pt is performing daily controller and system monitor self tests  along with completing weekly and monthly maintenance for LVAD equipment.   LVAD equipment check completed and is in good working order. Back up equipment present.     Exit Site Care: Drive line is being maintained weekly by David [name], his wife. Exit site healed and incorporated, the velour is fully implanted at exit site. No redness, drainage or foul odor noted. Pt denies fever or chills. Pt given 8 weekly dressings, 10 anchors and adhesive remover wipes for home use.    Past Medical History:  Diagnosis Date   AICD (automatic cardioverter/defibrillator) present    Anemia    Anxiety    CAD (coronary artery disease) 2009   AMI in 12/2007 with PCI to LAD, staged PCI to  mid/distal RCA, NSTEMI in 02/2009 with BMS to LCx   CHF (congestive heart failure) ([place])    Chronic kidney disease 11/03/2016   stage 3 kidney disease   Dysrhythmia    "arrythmia problems at some point", "fatal rhythms"   History of blood transfusion    "I was bleeding from was rectum" (04/19/2017)   HLD (hyperlipidemia)    HTN (hypertension)    Ischemic cardiomyopathy    Admitted in 07/2010 with CHF exacerbation   Myocardial infarction ([place])    "I've had 4" (04/19/2017)   Pneumonia 2018 X 1   Seizures ([place])    one seizure in 04/2016 during cardiac event (04/19/2017)    Current Outpatient Medications  Medication Sig  Dispense Refill   amiodarone (PACERONE) 200 MG tablet Take 1 tablet (200 mg total) by mouth 2 (two) times daily. 60 tablet 5   atorvastatin (LIPITOR) 40 MG tablet Take 1 tablet by mouth once daily 90 tablet 0   furosemide (LASIX) 40 MG tablet Take 1 tablet (40 mg total) by mouth daily as needed. 90 tablet 6   gabapentin (NEURONTIN) 300 MG capsule Take 1 capsule (300 mg total) by mouth 2 (two) times daily. 60 capsule 6   losartan (COZAAR) 50 MG tablet Take 1 tablet (50 mg total) by mouth daily. 90 tablet 3   magnesium oxide (MAG-OX) 400 MG tablet Take 1 tablet (400 mg total) by mouth 2 (two) times daily. 60  tablet 6   Misc Natural Products (ELDERBERRY IMMUNE COMPLEX) CHEW Chew 2 capsules by mouth daily.     oxyCODONE (OXY IR/ROXICODONE) 5 MG immediate release tablet Take 5 mg by mouth daily as needed for severe pain.     pantoprazole (PROTONIX) 40 MG tablet Take 1 tablet (40 mg total) by mouth daily. 90 tablet 3   potassium chloride SA (KLOR-CON M20) 20 MEQ tablet Take 1 tablet (20 mEq total) by mouth daily. KEEP OV. 90 tablet 3   traZODone (DESYREL) 50 MG tablet TAKE 1 TABLET BY MOUTH ONCE DAILY AT BEDTIME 30 tablet 6   warfarin (COUMADIN) 2 MG tablet TAKE 2 TABS BY MOUTH EVERY MONDAY AND FRIDAY, AND TAKE 1 TAB ALL OTHER DAYS OR AS DIRECTED BY HF CLINIC 60 tablet 6   cetirizine (ZYRTEC ALLERGY) 10 MG tablet Take 1 tablet (10 mg total) by mouth daily. (Patient not taking: Reported on 02/08/2021) 30 tablet 5   fluticasone (FLONASE) 50 MCG/ACT nasal spray Place 1 spray into both nostrils daily. (Patient not taking: Reported on 05/03/2021) 9.9 mL 4   No current facility-administered medications for this encounter.    Plavix [clopidogrel bisulfate]  Review of systems complete and found to be negative unless listed in HPI.      There were no vitals filed for this visit.  Wt Readings from Last 3 Encounters:  02/08/21 82.3 kg (181 lb 6.4 oz)  12/07/20 82.2 kg (181 lb 3.2 oz)  10/05/20 82.3 kg (181 lb 6.4 oz)      Vital Signs:  Doppler Pressure: 90 Automatc BP: 114/83 (91) HR: 79 NSR SPO2: UTO    Weight: 181.4 lb w/o eqt Last weight: 181.2 lb    Physical Exam: General:  NAD.  HEENT: normal  Neck: supple. JVP not elevated.  Carotids 2+ bilat; no bruits. No lymphadenopathy or thryomegaly appreciated. Cor: LVAD hum.  Lungs: Clear. Abdomen: obese soft, nontender, non-distended. No hepatosplenomegaly. No bruits or masses. Good bowel sounds. Driveline site clean. Anchor in place.  Extremities: no cyanosis, clubbing, rash. Warm no edema  Neuro: alert & oriented x 3. No focal deficits. Moves  all 4 without problem    ASSESSMENT AND PLAN:  1. Recurrent AFL with RVR - had DC-CV on 10/17/18 with subsequent short episodes of AF after but has been mostly in NSR on low-dose amio (100 daily) - Had recurrent AF in 10/22. Amio increased to 200 bid and converted.  - Now in NSR. Decrease amio to 200 daily   2. Chronic systolic CHF with HM III LVAD implant 04/20/17:  - Continues to do very well with VAD support. - NYHA I  - Volume status looks good today - He has been seen at [facility] for transplant referral but has decided not to  pursue VAD. No change  3. h/o UGI AVM bleed - s/p APC x 4 lesions in 2/19 - No recurrent bleeding  - Continue PPI - Off ASA due to GIB.  - Hgb stable at 16.3  4. CKD Stage IIIa:  - Creatinine baseline 1.3 - 1.7  - Creatinine 1.87 today - Continue to follow - Consider SGLT2i  5. VAD management - VAD interrogated personally. Parameters stable. - LDH 277 - INR goal 2.0-2.5 Off ASA due to GIB 2/19.  - INR 2.6 Discussed dosing with PharmD personally. - MAPs ok. In past MAPs have been slightly elevated and he has not tolerated tighter control well - DL site looks good  6. CAD:  s/p PCI RCA/PLOM with DES x2 and DES to mild LAD 1/18 - No s/s angina - Continue statin. Off ASA due to GIB  7. Hypomagnesemia. -  Continue mag supplements - Check level next visit  8. [employer] fan - unfortunately this has been incurable even with VAD implant - I will ask Judson Roch to get him a [employer] hat  Total time spent 40 minutes. Over half that time spent discussing above.    Glori Bickers, MD  10:37 AM

## 2021-05-03 NOTE — Progress Notes (Signed)
LVAD INR 

## 2021-05-03 NOTE — Progress Notes (Signed)
Patient presents for 2 month follow up in [facility] today with [name]) wife. Reports no problems or concerns with drive line.  Patient says he has been feeling great. Denies lightheadedness, dizziness, falls, shortness of breath, and signs of bleeding.   Reports intermittent right lower back pain that radiates into his leg. Believes this is due to his herniated disc.   Reports he is taking Lasix 40 mg 3-4 days per week for "bloating" in his abdomen. Reports good urine output. Weight stable. Denies shortness of breath.  Pt in NSR today on the monitor. Pt is currently taking Amiodarone 200 mg bid. Will decrease to daily today per DR. Bensimhon  Pt was supposed to have annual maintenance today but did not bring equipment.  Vital Signs:  Doppler Pressure: 90 Automatc BP: 114/83 (91) HR: 79 NSR SPO2: UTO    Weight: 183.2 lb w/o eqt Last weight: 181.4 lb  VAD Indication: Destination therapy- CKD   VAD interrogation & Equipment Management: Speed: 5400 Flow: 5.0 Power: 4.2w    PI: 3.3   Alarms: none Events: 20 - 40  PI events daily  Fixed speed 5400 Low speed limit: 5100   Primary Controller:  Replace back up battery in 31 months Back up controller:  Replace back up battery in 7 months   Annual Equipment Maintenance on UBC/PM was performed today on 06/08/20.    I reviewed the LVAD parameters from today and compared the results to the patient's prior recorded data. LVAD interrogation was NEGATIVE for significant power changes, negative for clinical alarms and STABLE for PI events/speed drops. No programming changes were made and pump is functioning within specified parameters. Pt is performing daily controller and system monitor self tests along with completing weekly and monthly maintenance for LVAD equipment.   LVAD equipment check completed and is in good working order. Back up equipment present.     Exit Site Care: Drive line is being maintained weekly by [name], his wife.  Exit site healed and incorporated, the velour is fully implanted at exit site. No redness, drainage or foul odor noted. Pt denies fever or chills. Pt given 8 weekly dressings for home use and 8 anchors.  Device: [employer] Bi-V Therapies: on 167 Last check: 10/29/20   BP & Labs:  Doppler BP 90 - Doppler is reflecting MAP   Hgb 16.3- No S/S of bleeding. Specifically denies melena/BRBPR or nosebleeds.  LDH stable at 207 with established baseline of 165- 250. Denies tea-colored urine. No power elevations noted on interrogation.    4 year Intermacs follow up completed including:  Quality of Life, KCCQ-12, and Neurocognitive trail making.   Pt completed 1450 feet during 6 minute walk.  Back up controller:  11V backup battery charged during this visit.  [place] Cardiomyopathy Questionnaire  KCCQ-12 05/03/2021 10/05/2020 03/24/2020  1 a. Ability to shower/bathe Not at all limited Not at all limited Not at all limited  1 b. Ability to walk 1 block Not at all limited Not at all limited Not at all limited  1 c. Ability to hurry/jog Slightly limited Not at all limited Slightly limited  2. Edema feet/ankles/legs Never over the past 2 weeks Never over the past 2 weeks Never over the past 2 weeks  3. Limited by fatigue Never over the past 2 weeks Never over the past 2 weeks Never over the past 2 weeks  4. Limited by dyspnea Never over the past 2 weeks Never over the past 2 weeks Less than once  a week  5. Sitting up / on 3+ pillows Never over the past 2 weeks Never over the past 2 weeks Never over the past 2 weeks  6. Limited enjoyment of life Moderately limited Slightly limited Slightly limited  7. Rest of life w/ symptoms Mostly satisfied Completely satisfied Completely satisfied  8 a. Participation in hobbies Limited quite a bit Slightly limited Moderately limited  8 b. Participation in chores Moderately limited Did not limit at all Moderately limited  8 c. Visiting family/friends Did not limit  at all Did not limit at all N/A, did not do for other reasons     Patient Instructions:  Decrease Amiodarone to 200 mg daily Coumadin dosing per Lauren PharmD Return to [address] [facility] in 2 months for follow up with Dr Haroldine Laws  Tanda Rockers RN [place] Coordinator  Office: [phone]  24/7 Pager: [phone]

## 2021-05-10 ENCOUNTER — Ambulatory Visit (HOSPITAL_COMMUNITY): Payer: Self-pay | Admitting: Pharmacist

## 2021-05-10 LAB — POCT INR: INR: 2.5 (ref 2.0–3.0)

## 2021-05-10 NOTE — Progress Notes (Signed)
LVAD INR 

## 2021-05-10 NOTE — Progress Notes (Signed)
Remote ICD transmission.   

## 2021-05-14 ENCOUNTER — Other Ambulatory Visit (HOSPITAL_COMMUNITY): Payer: Self-pay | Admitting: Internal Medicine

## 2021-05-14 DIAGNOSIS — Z95811 Presence of heart assist device: Secondary | ICD-10-CM

## 2021-05-17 ENCOUNTER — Ambulatory Visit (HOSPITAL_COMMUNITY): Payer: Self-pay | Admitting: Pharmacist

## 2021-05-17 LAB — POCT INR: INR: 3.4 — AB (ref 2.0–3.0)

## 2021-05-17 NOTE — Progress Notes (Signed)
LVAD INR 

## 2021-05-24 ENCOUNTER — Ambulatory Visit (HOSPITAL_COMMUNITY): Payer: Self-pay | Admitting: Pharmacist

## 2021-05-24 ENCOUNTER — Telehealth (HOSPITAL_COMMUNITY): Payer: Self-pay | Admitting: *Deleted

## 2021-05-24 DIAGNOSIS — Z95811 Presence of heart assist device: Secondary | ICD-10-CM

## 2021-05-24 LAB — POCT INR: INR: 3.1 — AB (ref 2.0–3.0)

## 2021-05-24 NOTE — Telephone Encounter (Signed)
MD INR reported out of range INR. INR done today.   Results INR 3.1

## 2021-05-31 ENCOUNTER — Ambulatory Visit (HOSPITAL_COMMUNITY): Payer: Self-pay | Admitting: Pharmacist

## 2021-05-31 LAB — POCT INR: INR: 2.8 (ref 2.0–3.0)

## 2021-05-31 NOTE — Progress Notes (Signed)
LVAD INR 

## 2021-06-05 ENCOUNTER — Other Ambulatory Visit: Payer: Self-pay

## 2021-06-05 ENCOUNTER — Encounter (HOSPITAL_COMMUNITY): Payer: Self-pay | Admitting: Internal Medicine

## 2021-06-05 ENCOUNTER — Inpatient Hospital Stay (HOSPITAL_COMMUNITY): Payer: Medicare HMO

## 2021-06-05 ENCOUNTER — Inpatient Hospital Stay (HOSPITAL_COMMUNITY)
Admission: EM | Admit: 2021-06-05 | Discharge: 2021-06-28 | DRG: 023 | Disposition: A | Discharge status: Rehabilitation Facility | Payer: Medicare HMO | Attending: Internal Medicine | Admitting: Internal Medicine

## 2021-06-05 ENCOUNTER — Emergency Department (HOSPITAL_COMMUNITY): Payer: Medicare HMO

## 2021-06-05 DIAGNOSIS — E871 Hypo-osmolality and hyponatremia: Secondary | ICD-10-CM | POA: Diagnosis not present

## 2021-06-05 DIAGNOSIS — I615 Nontraumatic intracerebral hemorrhage, intraventricular: Secondary | ICD-10-CM | POA: Diagnosis present

## 2021-06-05 DIAGNOSIS — K5900 Constipation, unspecified: Secondary | ICD-10-CM | POA: Diagnosis not present

## 2021-06-05 DIAGNOSIS — Z452 Encounter for adjustment and management of vascular access device: Secondary | ICD-10-CM | POA: Diagnosis not present

## 2021-06-05 DIAGNOSIS — Z888 Allergy status to other drugs, medicaments and biological substances status: Secondary | ICD-10-CM | POA: Diagnosis not present

## 2021-06-05 DIAGNOSIS — I959 Hypotension, unspecified: Secondary | ICD-10-CM | POA: Diagnosis not present

## 2021-06-05 DIAGNOSIS — Z7901 Long term (current) use of anticoagulants: Secondary | ICD-10-CM | POA: Diagnosis not present

## 2021-06-05 DIAGNOSIS — I2510 Atherosclerotic heart disease of native coronary artery without angina pectoris: Secondary | ICD-10-CM | POA: Diagnosis present

## 2021-06-05 DIAGNOSIS — I610 Nontraumatic intracerebral hemorrhage in hemisphere, subcortical: Secondary | ICD-10-CM | POA: Diagnosis present

## 2021-06-05 DIAGNOSIS — Z87891 Personal history of nicotine dependence: Secondary | ICD-10-CM

## 2021-06-05 DIAGNOSIS — Z95810 Presence of automatic (implantable) cardiac defibrillator: Secondary | ICD-10-CM

## 2021-06-05 DIAGNOSIS — E785 Hyperlipidemia, unspecified: Secondary | ICD-10-CM | POA: Diagnosis present

## 2021-06-05 DIAGNOSIS — I4892 Unspecified atrial flutter: Secondary | ICD-10-CM

## 2021-06-05 DIAGNOSIS — I130 Hypertensive heart and chronic kidney disease with heart failure and stage 1 through stage 4 chronic kidney disease, or unspecified chronic kidney disease: Secondary | ICD-10-CM | POA: Diagnosis present

## 2021-06-05 DIAGNOSIS — D6832 Hemorrhagic disorder due to extrinsic circulating anticoagulants: Secondary | ICD-10-CM | POA: Diagnosis present

## 2021-06-05 DIAGNOSIS — I611 Nontraumatic intracerebral hemorrhage in hemisphere, cortical: Secondary | ICD-10-CM | POA: Diagnosis present

## 2021-06-05 DIAGNOSIS — I5022 Chronic systolic (congestive) heart failure: Secondary | ICD-10-CM | POA: Diagnosis present

## 2021-06-05 DIAGNOSIS — Z955 Presence of coronary angioplasty implant and graft: Secondary | ICD-10-CM | POA: Diagnosis not present

## 2021-06-05 DIAGNOSIS — Z79899 Other long term (current) drug therapy: Secondary | ICD-10-CM

## 2021-06-05 DIAGNOSIS — I5043 Acute on chronic combined systolic (congestive) and diastolic (congestive) heart failure: Secondary | ICD-10-CM | POA: Diagnosis not present

## 2021-06-05 DIAGNOSIS — Z8249 Family history of ischemic heart disease and other diseases of the circulatory system: Secondary | ICD-10-CM

## 2021-06-05 DIAGNOSIS — J9690 Respiratory failure, unspecified, unspecified whether with hypoxia or hypercapnia: Secondary | ICD-10-CM

## 2021-06-05 DIAGNOSIS — R29810 Facial weakness: Secondary | ICD-10-CM | POA: Diagnosis present

## 2021-06-05 DIAGNOSIS — I255 Ischemic cardiomyopathy: Secondary | ICD-10-CM | POA: Diagnosis present

## 2021-06-05 DIAGNOSIS — I609 Nontraumatic subarachnoid hemorrhage, unspecified: Secondary | ICD-10-CM | POA: Diagnosis not present

## 2021-06-05 DIAGNOSIS — G911 Obstructive hydrocephalus: Secondary | ICD-10-CM | POA: Diagnosis present

## 2021-06-05 DIAGNOSIS — N179 Acute kidney failure, unspecified: Secondary | ICD-10-CM | POA: Diagnosis not present

## 2021-06-05 DIAGNOSIS — I252 Old myocardial infarction: Secondary | ICD-10-CM

## 2021-06-05 DIAGNOSIS — I618 Other nontraumatic intracerebral hemorrhage: Secondary | ICD-10-CM | POA: Diagnosis not present

## 2021-06-05 DIAGNOSIS — R4182 Altered mental status, unspecified: Secondary | ICD-10-CM | POA: Diagnosis present

## 2021-06-05 DIAGNOSIS — N1831 Chronic kidney disease, stage 3a: Secondary | ICD-10-CM | POA: Diagnosis present

## 2021-06-05 DIAGNOSIS — G936 Cerebral edema: Secondary | ICD-10-CM | POA: Diagnosis present

## 2021-06-05 DIAGNOSIS — I619 Nontraumatic intracerebral hemorrhage, unspecified: Secondary | ICD-10-CM | POA: Diagnosis present

## 2021-06-05 DIAGNOSIS — R339 Retention of urine, unspecified: Secondary | ICD-10-CM | POA: Diagnosis not present

## 2021-06-05 DIAGNOSIS — Z20822 Contact with and (suspected) exposure to covid-19: Secondary | ICD-10-CM | POA: Diagnosis present

## 2021-06-05 DIAGNOSIS — R5381 Other malaise: Secondary | ICD-10-CM | POA: Diagnosis not present

## 2021-06-05 DIAGNOSIS — Z95811 Presence of heart assist device: Secondary | ICD-10-CM | POA: Diagnosis not present

## 2021-06-05 DIAGNOSIS — I480 Paroxysmal atrial fibrillation: Secondary | ICD-10-CM | POA: Diagnosis present

## 2021-06-05 DIAGNOSIS — I629 Nontraumatic intracranial hemorrhage, unspecified: Secondary | ICD-10-CM | POA: Diagnosis not present

## 2021-06-05 DIAGNOSIS — I5023 Acute on chronic systolic (congestive) heart failure: Secondary | ICD-10-CM | POA: Diagnosis present

## 2021-06-05 DIAGNOSIS — I612 Nontraumatic intracerebral hemorrhage in hemisphere, unspecified: Secondary | ICD-10-CM | POA: Diagnosis not present

## 2021-06-05 DIAGNOSIS — T45515A Adverse effect of anticoagulants, initial encounter: Secondary | ICD-10-CM | POA: Diagnosis present

## 2021-06-05 DIAGNOSIS — Z825 Family history of asthma and other chronic lower respiratory diseases: Secondary | ICD-10-CM

## 2021-06-05 DIAGNOSIS — N183 Chronic kidney disease, stage 3 unspecified: Secondary | ICD-10-CM | POA: Diagnosis present

## 2021-06-05 LAB — CBG MONITORING, ED: Glucose-Capillary: 148 mg/dL — ABNORMAL HIGH (ref 70–99)

## 2021-06-05 LAB — COMPREHENSIVE METABOLIC PANEL
ALT: 29 U/L (ref 0–44)
AST: 29 U/L (ref 15–41)
Albumin: 3.3 g/dL — ABNORMAL LOW (ref 3.5–5.0)
Alkaline Phosphatase: 114 U/L (ref 38–126)
Anion gap: 11 (ref 5–15)
BUN: 16 mg/dL (ref 6–20)
CO2: 21 mmol/L — ABNORMAL LOW (ref 22–32)
Calcium: 9.3 mg/dL (ref 8.9–10.3)
Chloride: 105 mmol/L (ref 98–111)
Creatinine, Ser: 1.64 mg/dL — ABNORMAL HIGH (ref 0.61–1.24)
GFR, Estimated: 48 mL/min — ABNORMAL LOW (ref >60)
Glucose, Bld: 150 mg/dL — ABNORMAL HIGH (ref 70–99)
Potassium: 3.9 mmol/L (ref 3.5–5.1)
Sodium: 137 mmol/L (ref 135–145)
Total Bilirubin: 0.7 mg/dL (ref 0.3–1.2)
Total Protein: 7.6 g/dL (ref 6.5–8.1)

## 2021-06-05 LAB — CBC WITH DIFFERENTIAL/PLATELET
Abs Immature Granulocytes: 0.05 10*3/uL (ref 0.00–0.07)
Basophils Absolute: 0.1 10*3/uL (ref 0.0–0.1)
Basophils Relative: 0 %
Eosinophils Absolute: 0 10*3/uL (ref 0.0–0.5)
Eosinophils Relative: 0 %
HCT: 49.5 % (ref 39.0–52.0)
Hemoglobin: 16 g/dL (ref 13.0–17.0)
Immature Granulocytes: 0 %
Lymphocytes Relative: 6 %
Lymphs Abs: 0.6 10*3/uL — ABNORMAL LOW (ref 0.7–4.0)
MCH: 26.8 pg (ref 26.0–34.0)
MCHC: 32.3 g/dL (ref 30.0–36.0)
MCV: 83.1 fL (ref 80.0–100.0)
Monocytes Absolute: 0.5 10*3/uL (ref 0.1–1.0)
Monocytes Relative: 5 %
Neutro Abs: 10.1 10*3/uL — ABNORMAL HIGH (ref 1.7–7.7)
Neutrophils Relative %: 89 %
Platelets: 185 10*3/uL (ref 150–400)
RBC: 5.96 MIL/uL — ABNORMAL HIGH (ref 4.22–5.81)
RDW: 14.6 % (ref 11.5–15.5)
WBC: 11.4 10*3/uL — ABNORMAL HIGH (ref 4.0–10.5)
nRBC: 0 % (ref 0.0–0.2)

## 2021-06-05 LAB — RESP PANEL BY RT-PCR (FLU A&B, COVID) ARPGX2
Influenza A by PCR: NEGATIVE
Influenza B by PCR: NEGATIVE
SARS Coronavirus 2 by RT PCR: NEGATIVE

## 2021-06-05 LAB — PROTIME-INR
INR: 2.4 — ABNORMAL HIGH (ref 0.8–1.2)
INR: 1.1 (ref 0.8–1.2)
Prothrombin Time: 26 seconds — ABNORMAL HIGH (ref 11.4–15.2)
Prothrombin Time: 14.1 seconds (ref 11.4–15.2)

## 2021-06-05 LAB — LACTIC ACID, PLASMA: Lactic Acid, Venous: 1.9 mmol/L (ref 0.5–1.9)

## 2021-06-05 LAB — HIV ANTIBODY (ROUTINE TESTING W REFLEX): HIV Screen 4th Generation wRfx: NONREACTIVE

## 2021-06-05 LAB — TROPONIN I (HIGH SENSITIVITY)
Troponin I (High Sensitivity): 25 ng/L — ABNORMAL HIGH (ref <18)
Troponin I (High Sensitivity): 51 ng/L — ABNORMAL HIGH (ref <18)

## 2021-06-05 LAB — AMMONIA: Ammonia: 30 umol/L (ref 9–35)

## 2021-06-05 LAB — ETHANOL: Alcohol, Ethyl (B): <10 mg/dL (ref <10)

## 2021-06-05 MED ORDER — ATORVASTATIN CALCIUM 40 MG PO TABS
40.0000 mg | ORAL_TABLET | Freq: Every day | ORAL | Status: DC
  Administered 2021-06-06 – 2021-06-28 (×23): 40 mg via ORAL
  Filled 2021-06-05 (×23): qty 1

## 2021-06-05 MED ORDER — PROTHROMBIN COMPLEX CONC HUMAN 500 UNITS IV KIT
2220.0000 [IU] | PACK | Status: AC
  Administered 2021-06-05: 2220 [IU] via INTRAVENOUS
  Filled 2021-06-05 (×2): qty 2220

## 2021-06-05 MED ORDER — HYDRALAZINE HCL 20 MG/ML IJ SOLN
10.0000 mg | INTRAMUSCULAR | Status: DC | PRN
  Administered 2021-06-05 – 2021-06-20 (×13): 10 mg via INTRAVENOUS
  Filled 2021-06-05 (×10): qty 1

## 2021-06-05 MED ORDER — ONDANSETRON HCL 4 MG/2ML IJ SOLN
4.0000 mg | Freq: Four times a day (QID) | INTRAMUSCULAR | Status: DC | PRN
  Administered 2021-06-11 – 2021-06-19 (×2): 4 mg via INTRAVENOUS
  Filled 2021-06-05 (×2): qty 2

## 2021-06-05 MED ORDER — PANTOPRAZOLE SODIUM 40 MG PO TBEC
40.0000 mg | DELAYED_RELEASE_TABLET | Freq: Every day | ORAL | Status: DC
  Administered 2021-06-06 – 2021-06-28 (×23): 40 mg via ORAL
  Filled 2021-06-05 (×23): qty 1

## 2021-06-05 MED ORDER — PROTHROMBIN COMPLEX CONC HUMAN 500 UNITS IV KIT: 1500.0000 [IU] | PACK | Status: DC

## 2021-06-05 MED ORDER — ATROPINE SULFATE 1 MG/10ML IJ SOSY
PREFILLED_SYRINGE | INTRAMUSCULAR | Status: AC
  Filled 2021-06-05: qty 20

## 2021-06-05 MED ORDER — AMIODARONE HCL 200 MG PO TABS
200.0000 mg | ORAL_TABLET | Freq: Every day | ORAL | Status: DC
  Administered 2021-06-06 – 2021-06-28 (×23): 200 mg via ORAL
  Filled 2021-06-05 (×23): qty 1

## 2021-06-05 MED ORDER — EPINEPHRINE 1 MG/10ML IJ SOSY
PREFILLED_SYRINGE | INTRAMUSCULAR | Status: AC
  Filled 2021-06-05: qty 20

## 2021-06-05 MED ORDER — ACETAMINOPHEN 325 MG PO TABS
650.0000 mg | ORAL_TABLET | ORAL | Status: DC | PRN
  Administered 2021-06-05 – 2021-06-27 (×27): 650 mg via ORAL
  Filled 2021-06-05 (×28): qty 2

## 2021-06-05 MED ORDER — ATORVASTATIN CALCIUM 40 MG PO TABS: 40.0000 mg | ORAL_TABLET | Freq: Every day | ORAL | Status: DC

## 2021-06-05 MED ORDER — AMIODARONE HCL 200 MG PO TABS: 200.0000 mg | ORAL_TABLET | Freq: Every day | ORAL | Status: DC

## 2021-06-05 MED ORDER — HYDRALAZINE HCL 20 MG/ML IJ SOLN
10.0000 mg | INTRAMUSCULAR | Status: DC | PRN
  Administered 2021-06-05 (×2): 10 mg via INTRAVENOUS
  Filled 2021-06-05: qty 1

## 2021-06-05 MED ORDER — MAGNESIUM OXIDE -MG SUPPLEMENT 400 (240 MG) MG PO TABS: 400.0000 mg | ORAL_TABLET | Freq: Two times a day (BID) | ORAL | Status: DC

## 2021-06-05 MED ORDER — ONDANSETRON HCL 4 MG/2ML IJ SOLN
INTRAMUSCULAR | Status: AC
  Filled 2021-06-05: qty 2

## 2021-06-05 MED ORDER — PANTOPRAZOLE SODIUM 40 MG PO TBEC: 40.0000 mg | DELAYED_RELEASE_TABLET | Freq: Every day | ORAL | Status: DC

## 2021-06-05 MED ORDER — MAGNESIUM OXIDE -MG SUPPLEMENT 400 (240 MG) MG PO TABS
400.0000 mg | ORAL_TABLET | Freq: Two times a day (BID) | ORAL | Status: DC
  Administered 2021-06-06 – 2021-06-28 (×45): 400 mg via ORAL
  Filled 2021-06-05 (×45): qty 1

## 2021-06-05 MED ORDER — VITAMIN K1 10 MG/ML IJ SOLN
10.0000 mg | INTRAVENOUS | Status: AC
  Administered 2021-06-05: 10 mg via INTRAVENOUS
  Filled 2021-06-05: qty 1

## 2021-06-05 MED ORDER — CHLORHEXIDINE GLUCONATE CLOTH 2 % EX PADS
6.0000 | MEDICATED_PAD | Freq: Every day | CUTANEOUS | Status: DC
  Administered 2021-06-06 – 2021-06-07 (×2): 6 via TOPICAL

## 2021-06-05 NOTE — Consult Note (Addendum)
Reason for Consult: Dr. Regenia Skeeter Referring Physician: Intracerebral hemorrhage, intraventricular hemorrhage, obstructive hydrocephalus  David [name] is an 56 y.o. male.  HPI: The patient is a 56 year old black male with a history of congestive heart failure and LVAD on Coumadin.  According to his wife he complained of a bit of a headache and stiff neck yesterday.  This morning he was sleepy and somewhat hard to arouse.  He was brought to the ER.  A head scan was obtained which demonstrated an intracerebral and intraventricular hemorrhage with obstructive hydrocephalus.  A neurosurgical consultation was requested.  Presently the patient is accompanied by his wife, brother and sister-in-law.  He is in the process of getting Kcentra.  He is somnolent but arousable.  Past Medical History:  Diagnosis Date   AICD (automatic cardioverter/defibrillator) present    Anemia    Anxiety    CAD (coronary artery disease) 2009   AMI in 12/2007 with PCI to LAD, staged PCI to  mid/distal RCA, NSTEMI in 02/2009 with BMS to LCx   CHF (congestive heart failure) ([place])    Chronic kidney disease 11/03/2016   stage 3 kidney disease   Dysrhythmia    "arrythmia problems at some point", "fatal rhythms"   History of blood transfusion    "I was bleeding from was rectum" (04/19/2017)   HLD (hyperlipidemia)    HTN (hypertension)    Ischemic cardiomyopathy    Admitted in 07/2010 with CHF exacerbation   Myocardial infarction ([place])    "I've had 4" (04/19/2017)   Pneumonia 2018 X 1   Seizures ([place])    one seizure in 04/2016 during cardiac event (04/19/2017)    Past Surgical History:  Procedure Laterality Date   AORTIC VALVE REPAIR N/A 04/20/2017   Procedure: AORTIC VALVE REPAIR;  Surgeon: Gaye Pollack, MD;  Location: [facility];  Service: Open Heart Surgery;  Laterality: N/A;   BIV ICD INSERTION CRT-D N/A 08/01/2016   Procedure: BiV ICD Insertion CRT-D;  Surgeon: Will Meredith Leeds, MD;  Location: [facility];  Service: Cardiovascular;  Laterality: N/A;   CARDIAC CATHETERIZATION N/A 05/05/2016   Procedure: Right/Left Heart Cath and Coronary Angiography;  Surgeon: Jolaine Artist, MD;  Location: [facility];  Service: Cardiovascular;  Laterality: N/A;   CARDIAC CATHETERIZATION N/A 05/09/2016   Procedure: Coronary Stent Intervention;  Surgeon: Peter M Martinique, MD;  Location: [facility];  Service: Cardiovascular;  Laterality: N/A;   CARDIAC CATHETERIZATION N/A 05/09/2016   Procedure: Intravascular Pressure Wire/FFR Study;  Surgeon: Peter M Martinique, MD;  Location: [facility];  Service: Cardiovascular;  Laterality: N/A;   CARDIOVERSION N/A 10/17/2018   Procedure: CARDIOVERSION;  Surgeon: Jolaine Artist, MD;  Location: [facility];  Service: Cardiovascular;  Laterality: N/A;   CORONARY ANGIOPLASTY WITH STENT PLACEMENT     "i've got a total of 12 stents" (04/19/2017)   ELECTROPHYSIOLOGY STUDY N/A 06/24/2016   Procedure: Electrophysiology Study;  Surgeon: Deboraha Sprang, MD;  Location: [facility];  Service: Cardiovascular;  Laterality: N/A;   ENTEROSCOPY N/A 06/03/2017   Procedure: ENTEROSCOPY;  Surgeon: Yetta Flock, MD;  Location: [facility] ENDOSCOPY;  Service: Gastroenterology;  Laterality: N/A;   EPICARDIAL PACING LEAD PLACEMENT Left 11/07/2016   Procedure: LV EPICARDIAL PACING LEAD PLACEMENT VIA LEFT MINI THORACOTOMY;  Surgeon: Gaye Pollack, MD;  Location: [facility];  Service: Thoracic;  Laterality: Left;   ICD IMPLANT N/A 06/24/2016   Procedure: POSSIBLE  ICD Implant;  Surgeon:  Deboraha Sprang, MD;  Location: [facility];  Service: Cardiovascular;  Laterality: N/A;   INSERTION OF IMPLANTABLE LEFT VENTRICULAR ASSIST DEVICE N/A 04/20/2017   Procedure: INSERTION OF IMPLANTABLE LEFT VENTRICULAR ASSIST DEVICE, Aortic Valve repair;  Surgeon: Gaye Pollack, MD;  Location: [facility];  Service: Open Heart Surgery;  Laterality: N/A;  Heartmate 3   IR FLUORO GUIDE CV LINE RIGHT   05/08/2017   IR FLUORO GUIDE CV MIDLINE PICC RIGHT  03/14/2017   IR REMOVAL TUN CV CATH W/O FL  05/25/2017   IR US GUIDE VASC ACCESS RIGHT  03/14/2017   IR US GUIDE VASC ACCESS RIGHT  05/08/2017   LEFT HEART CATHETERIZATION WITH CORONARY ANGIOGRAM N/A 03/13/2011   Procedure: LEFT HEART CATHETERIZATION WITH CORONARY ANGIOGRAM;  Surgeon: Lorretta Harp, MD;  Location: [facility] CATH LAB;  Service: Cardiovascular;  Laterality: N/A;   MULTIPLE EXTRACTIONS WITH ALVEOLOPLASTY N/A 04/12/2017   Procedure: Extraction of tooth #'s 2, 5-12, 17, 20-29 with alveoloplasty amd maxillary right buccal exostoses reductions.;  Surgeon: Lenn Cal, DDS;  Location: [place];  Service: Oral Surgery;  Laterality: N/A;   RIGHT HEART CATH N/A 04/19/2017   Procedure: RIGHT HEART CATH;  Surgeon: Jolaine Artist, MD;  Location: [facility];  Service: Cardiovascular;  Laterality: N/A;   RIGHT/LEFT HEART CATH AND CORONARY ANGIOGRAPHY N/A 03/10/2017   Procedure: RIGHT/LEFT HEART CATH AND CORONARY ANGIOGRAPHY;  Surgeon: Jolaine Artist, MD;  Location: [place];  Service: Cardiovascular;  Laterality: N/A;   TEE WITHOUT CARDIOVERSION N/A 04/20/2017   Procedure: TRANSESOPHAGEAL ECHOCARDIOGRAM (TEE);  Surgeon: Gaye Pollack, MD;  Location: [facility];  Service: Open Heart Surgery;  Laterality: N/A;   TEE WITHOUT CARDIOVERSION N/A 10/17/2018   Procedure: TRANSESOPHAGEAL ECHOCARDIOGRAM (TEE);  Surgeon: Jolaine Artist, MD;  Location: [facility] ENDOSCOPY;  Service: Cardiovascular;  Laterality: N/A;    Family History  Problem Relation Age of Onset   Coronary artery disease Father    Hypertension Father    Hypertension Sister    Coronary artery disease Brother    Emphysema Brother    Coronary artery disease Mother    Hypertension Mother    Emphysema Mother    Hypertension Daughter    Obesity Daughter    Diabetes Other    Hypertension Other    Coronary artery disease Other     Social History:  reports that  he quit smoking about 5 years ago. His smoking use included cigarettes and cigars. He has a 21.50 pack-year smoking history. He has never used smokeless tobacco. He reports that he does not drink alcohol and does not use drugs.  Allergies:  Allergies  Allergen Reactions   Plavix [Clopidogrel Bisulfate] Hives    Medications: I have reviewed the patient's current medications. Prior to Admission: (Not in a hospital admission)  Scheduled: Continuous:  prothrombin complex conc human (Kcentra) IVPB     PRN: Anti-infectives (From admission, onward)    None        Results for orders placed or performed during the hospital encounter of 06/05/21 (from the past 48 hour(s))  Comprehensive metabolic panel     Status: Abnormal   Collection Time: 06/05/21  6:36 AM  Result Value Ref Range   Sodium 137 135 - 145 mmol/L   Potassium 3.9 3.5 - 5.1 mmol/L   Chloride 105 98 - 111 mmol/L   CO2 21 (L) 22 - 32 mmol/L   Glucose, Bld 150 (H) 70 - 99 mg/dL  Comment: Glucose reference range applies only to samples taken after fasting for at least 8 hours.   BUN 16 6 - 20 mg/dL   Creatinine, Ser 1.64 (H) 0.61 - 1.24 mg/dL   Calcium 9.3 8.9 - 10.3 mg/dL   Total Protein 7.6 6.5 - 8.1 g/dL   Albumin 3.3 (L) 3.5 - 5.0 g/dL   AST 29 15 - 41 U/L   ALT 29 0 - 44 U/L   Alkaline Phosphatase 114 38 - 126 U/L   Total Bilirubin 0.7 0.3 - 1.2 mg/dL   GFR, Estimated 48 (L) >60 mL/min    Comment: (NOTE) Calculated using the CKD-EPI Creatinine Equation (2021)    Anion gap 11 5 - 15    Comment: Performed at [facility] [address] [place], [place] [address]  CBC with Differential     Status: Abnormal   Collection Time: 06/05/21  6:36 AM  Result Value Ref Range   WBC 11.4 (H) 4.0 - 10.5 K/uL   RBC 5.96 (H) 4.22 - 5.81 MIL/uL   Hemoglobin 16.0 13.0 - 17.0 g/dL   HCT 49.5 39.0 - 52.0 %   MCV 83.1 80.0 - 100.0 fL   MCH 26.8 26.0 - 34.0 pg   MCHC 32.3 30.0 - 36.0 g/dL   RDW 14.6 11.5 - 15.5 %    Platelets 185 150 - 400 K/uL   nRBC 0.0 0.0 - 0.2 %   Neutrophils Relative % 89 %   Neutro Abs 10.1 (H) 1.7 - 7.7 K/uL   Lymphocytes Relative 6 %   Lymphs Abs 0.6 (L) 0.7 - 4.0 K/uL   Monocytes Relative 5 %   Monocytes Absolute 0.5 0.1 - 1.0 K/uL   Eosinophils Relative 0 %   Eosinophils Absolute 0.0 0.0 - 0.5 K/uL   Basophils Relative 0 %   Basophils Absolute 0.1 0.0 - 0.1 K/uL   Immature Granulocytes 0 %   Abs Immature Granulocytes 0.05 0.00 - 0.07 K/uL    Comment: Performed at [facility], [place] [address], [place], [place] [address]  Troponin I (High Sensitivity)     Status: Abnormal   Collection Time: 06/05/21  6:36 AM  Result Value Ref Range   Troponin I (High Sensitivity) 25 (H) <18 ng/L    Comment: (NOTE) Elevated high sensitivity troponin I (hsTnI) values and significant  changes across serial measurements may suggest ACS but many other  chronic and acute conditions are known to elevate hsTnI results.  Refer to the "Links" section for chest pain algorithms and additional  guidance. Performed at [facility], [place] [address], [place], [place] [address]   Lactic acid, plasma     Status: None   Collection Time: 06/05/21  6:36 AM  Result Value Ref Range   Lactic Acid, Venous 1.9 0.5 - 1.9 mmol/L    Comment: Performed at [facility] [address] [place], [place] [address]  Protime-INR     Status: Abnormal   Collection Time: 06/05/21  6:36 AM  Result Value Ref Range   Prothrombin Time 26.0 (H) 11.4 - 15.2 seconds   INR 2.4 (H) 0.8 - 1.2    Comment: (NOTE) INR goal varies based on device and disease states. Performed at [facility], [place] [address], [place],  [address]   Ammonia     Status: None   Collection Time: 06/05/21  6:46 AM  Result Value Ref Range   Ammonia 30 9 - 35  umol/L    Comment: Performed at [facility], [place] [address] [place], [place] [address]  Ethanol     Status: None   Collection Time: 06/05/21  6:46 AM   Result Value Ref Range   Alcohol, Ethyl (B) <10 <10 mg/dL    Comment: (NOTE) Lowest detectable limit for serum alcohol is 10 mg/dL.  For medical purposes only. Performed at [facility], [place] [address] [place], [place] [address]   CBG monitoring, ED     Status: Abnormal   Collection Time: 06/05/21  7:45 AM  Result Value Ref Range   Glucose-Capillary 148 (H) 70 - 99 mg/dL    Comment: Glucose reference range applies only to samples taken after fasting for at least 8 hours.    CT Head Wo Contrast  Result Date: 06/05/2021 CLINICAL DATA:  Mental status change with unknown cause EXAM: CT HEAD WITHOUT CONTRAST TECHNIQUE: Contiguous axial images were obtained from the base of the skull through the vertex without intravenous contrast. RADIATION DOSE REDUCTION: This exam was performed according to the departmental dose-optimization program which includes automated exposure control, adjustment of the mA and/or kV according to patient size and/or use of iterative reconstruction technique. COMPARISON:  None. FINDINGS: Brain: Blood clot centered at the right caudothalamic groove measuring approximately 2.3 cm. Decompression into the right lateral ventricle and extending into the third and fourth ventricles. Right more than left lateral ventricular dilatation with leftward shift of the septum pellucidum and asymmetric ballooning of the right sided horns. There is some periventricular low-density that could be chronic small-vessel disease or periventricular flow. Remote small cortically based infarct in the high left frontal lobe. Vascular: No hyperdense vessel or unexpected calcification. Skull: Normal. Negative for fracture or focal lesion. Sinuses/Orbits: No acute finding. Other: Critical Value/emergent results were called by telephone at the time of interpretation on 06/05/2021 at 7:36 am to provider Dr Regenia Skeeter, who verbally acknowledged these results. IMPRESSION: Acute hemorrhage originating at the  right caudothalamic groove with intraventricular clot in the right lateral ventricle to fourth ventricle. Obstructive changes at the right lateral ventricle which is asymmetrically ballooned. Electronically Signed   By: Jorje Guild M.D.   On: 06/05/2021 07:37    ROS as above Blood pressure (!) 134/105, pulse 93, temperature 98.2 F (36.8 C), temperature source Oral, resp. rate (!) 33, weight 82.1 kg, SpO2 93 %. Estimated body mass index is 30.12 kg/m as calculated from the following:   Height as of 05/03/21: 5\' 5"  (1.651 m).   Weight as of this encounter: 82.1 kg.  Physical Exam  General: A somnolent but arousable 56 year old black male with an LVAD  HEENT: Normocephalic, the patient's pupils are equal, extraocular muscles are intact  Neck: Unremarkable  Thorax: LVAD  Abdomen: Soft  Extremities: Unremarkable  Neurologic exam: The patient is Glasgow Coma Scale 13, [id number].  The patient is somnolent but arousable to voice.  He knows he is at [facility] but cannot tell me the month or year.  Cranial nerve exam is very limited but I do not see any obvious deficits.  Extract muscles are intact.  Pupils are equal.  The patient follows commands bilaterally.  He may be slightly weak on the left.  Imaging studies I reviewed the patient's head CT performed [facility] today.  He has a right caudate hemorrhage with extension into the ventricles.  He has obstructive hydrocephalus.  Assessment/Plan: Basal ganglia hemorrhage, interventricular hemorrhage, obstructive hydrocephalus: I have discussed the situation  with Dr. Tempie Hoist and the patient's family.  I have explained that he is between a rock and a hard place with needing anticoagulation for his LVAD and with a significant intracranial hemorrhage.  He is in the process of having his Coumadin reversed.  We discussed the various treatment options including doing nothing versus placement of a ventriculostomy.  I recommended  placement of ventriculostomy.  I described this procedure.  We discussed the risks including risk of infection, hemorrhage, ventricular catheter malfunction or malplacement, etc.  I have answered all her questions.  She has consented on behalf of the patient.  We will plan to do this once his Coumadin is reversed.  [name] 06/05/2021, 8:50 AM

## 2021-06-05 NOTE — ED Triage Notes (Signed)
Headache x 1 day with associated neck pain, pt went to sleep around 2330 and woke up at 4:30am and was unable to walk/ams. Pt LVAD in place, pt aox3, altered to time. Pt on coumadin. Equal grips, no facial droop, no drift. Pt endorsed nausea and vomiting PTA

## 2021-06-05 NOTE — ED Provider Triage Note (Signed)
Emergency Medicine Provider Triage Evaluation Note  David [name] , a 56 y.o. male  was evaluated in triage.  Pt complains of headache and altered mental status..  According to the wife, he has been complaining of pain to the neck and the back of his head.  Yesterday evening, he vomited once.  When patient woke this morning, he seemed confused, disoriented, and seemed to be having difficulty staying awake.  His wife called EMS and patient was transported here.  He did have 1 episode of vomiting with EMS.  Patient himself adds little additional history secondary to somnolence, but does respond appropriately to commands and is arousable when stimulated.  Review of Systems  Positive: Headache, nausea, and vomiting Negative: Fevers, cough, chest pain, shortness of breath, or exposures to ill contacts  Physical Exam  BP 115/89 (BP Location: Right Arm)    Pulse 87    Temp 98.2 F (36.8 C) (Oral)    Resp 16    SpO2 95%  Gen:   Patient is somnolent, but arousable.  He will follow commands and communicate for the most part appropriately.  He is somewhat disoriented to the date believing it is 2020. Resp:  Normal effort.  Breath sounds are clear.   AED device is audible with auscultation of the heart. MSK:   Moves extremities without difficulty  Other:  Patient does appear somewhat pale and seems to be feeling unwell.  Medical Decision Making  Medically screening exam initiated at 6:45 AM.  Appropriate orders placed.  Hayden Rasmussen was informed that the remainder of the evaluation will be completed by another provider, this initial triage assessment does not replace that evaluation, and the importance of remaining in the ED until their evaluation is complete.  Care to be assumed by Dr. Regenia Skeeter at shift change to complete the work-up and determine the final disposition.  Concern at this time is for possible stroke versus hemorrhage versus metabolic encephalopathy.  Laboratory studies have been  ordered as has been a CT scan.   Veryl Speak, MD 06/05/21 [phone]

## 2021-06-05 NOTE — Consult Note (Signed)
NAME:  David [name], MRN:  [id number], DOB:  [birth year], LOS: 0 ADMISSION DATE:  06/05/2021, CONSULTATION DATE:  06/05/2021 REFERRING MD: Bensimhon , CHIEF COMPLAINT:  ICH   History of Present Illness:  56 year old man with LVAD and acute ICH  Started with cephalgia 2 nights ago and then developed dysequilibrium, vomiting, confusion and generalized weakness.   Heartmate III placed 12/19 as destination therapy.   Pertinent  Medical History   Past Medical History:  Diagnosis Date   AICD (automatic cardioverter/defibrillator) present    Anemia    Anxiety    CAD (coronary artery disease) 2009   AMI in 12/2007 with PCI to LAD, staged PCI to  mid/distal RCA, NSTEMI in 02/2009 with BMS to LCx   CHF (congestive heart failure) ([place])    Chronic kidney disease 11/03/2016   stage 3 kidney disease   Dysrhythmia    "arrythmia problems at some point", "fatal rhythms"   History of blood transfusion    "I was bleeding from was rectum" (04/19/2017)   HLD (hyperlipidemia)    HTN (hypertension)    Ischemic cardiomyopathy    Admitted in 07/2010 with CHF exacerbation   Myocardial infarction ([place])    "I've had 4" (04/19/2017)   Pneumonia 2018 X 1   Seizures ([place])    one seizure in 04/2016 during cardiac event (04/19/2017)     Significant Hospital Events: Including procedures, antibiotic start and stop dates in addition to other pertinent events   2/11 - Found to have right basal ganglia hemorrhage with intraventricular extension.   Interim History / Subjective:  Responses are slow but able to respond to question, denies headache at this time  Objective   Blood pressure (!) 84/71, pulse 85, temperature 98.6 F (37 C), temperature source Oral, resp. rate 18, weight 82.7 kg, SpO2 95 %.        Intake/Output Summary (Last 24 hours) at 06/05/2021 1444 Last data filed at 06/05/2021 1400 Gross per 24 hour  Intake 0.8 ml  Output 37 ml  Net -36.2 ml   Filed Weights   06/05/21 0800 06/05/21  1000  Weight: 82.1 kg 82.7 kg    Examination: General: appears chronically unwell. Appears well nourished.  HENT: no icterus.  Lungs: clear to auscultation.  Cardiovascular: no JVD. LV hum. Abdomen: soft, driveline intact.  Extremities: no edema. Neuro: somnolent with paucity of speech. Left facial drop and left pronator drift but able to move all limbs to command. Congested cough but adequate phonation.  GU: no foley  Ancillary tests personally reviewed:  CT head right basal ganglia hemorrhage with intraventricular extension and shift of the septum pellicidum to the left with early hydrocephalus.  Creatinine 1.64   Assessment & Plan:  Active Problems:   Chronic systolic heart failure (HCC)   CKD (chronic kidney disease), stage III (HCC)   Ischemic cardiomyopathy   Presence of left ventricular assist device (LVAD) (HCC)   Left ventricular assist device present ([place])   ICH (intracerebral hemorrhage) (HCC) ICH score: 1 - 13% mortality  Plan:   - Anticoagulation reversed in ED with K-Centra.  - Keep MAP < 90 - EVD placement by neurosurgery - Run VAD with no anticoagulation at least until EVD is out.  - Euvolemic at present.  - At risk for neurological decompensation and loss of airway. Will follow.   Best Practice (right click and "Reselect all SmartList Selections" daily)   Diet/type: NPO swallow evaluation DVT prophylaxis: SCD GI prophylaxis: N/A Lines: N/A  Foley:  N/A Code Status:  full code no chest compression due to VAD Last date of multidisciplinary goals of care discussion [per HF]  Kipp Brood, MD [facility] ICU Physician [place]  Pager: [phone] Or Epic Secure Chat After hours: [phone].  06/05/2021, 3:08 PM

## 2021-06-05 NOTE — ED Provider Notes (Signed)
[place] EMERGENCY DEPARTMENT Provider Note   CSN: [id number] Arrival date & time: 06/05/21  3474     History  Chief Complaint  Patient presents with   Altered Mental Status    Headache x 1 day with associated neck pain, pt went to sleep around 2330 and woke up at 4:30am and was unable to walk/ams per wife.     STILES MAXCY is a 56 y.o. male.  HPI 56 year old male presents with neck pain and headache.  History is primarily from the wife and somewhat from the daughter at the bedside.  2 nights ago he started developing some posterior neck pain that he states felt like he slept on it wrong.  It has progressively worsened and last night he was complaining of a going into his head and having an occipital headache.  Around 11 PM or midnight last night he was going to bed but seemed a little off balance but was able to go to bed and hookup his LVAD.  However this morning he has developed vomiting and seems to have worsening weakness and confusion.  Wife felt like he was disoriented.  Seem like both of his legs were weak and she has not noticed any unilateral weakness. No trauma.  Patient is sleepy and is able to awaken talk to me but seems confused and is unable to significantly participate in history.  Home Medications Prior to Admission medications   Medication Sig Start Date End Date Taking? Authorizing Provider  amiodarone (PACERONE) 200 MG tablet Take 1 tablet (200 mg total) by mouth daily. 05/03/21  Yes Bensimhon, Shaune Pascal, MD  atorvastatin (LIPITOR) 40 MG tablet Take 1 tablet by mouth once daily 05/14/21  Yes Bensimhon, Shaune Pascal, MD  cetirizine (ZYRTEC ALLERGY) 10 MG tablet Take 1 tablet (10 mg total) by mouth daily. 10/05/20  Yes Bensimhon, Shaune Pascal, MD  furosemide (LASIX) 40 MG tablet Take 1 tablet (40 mg total) by mouth daily as needed. 02/04/21  Yes Bensimhon, Shaune Pascal, MD  gabapentin (NEURONTIN) 300 MG capsule Take 1 capsule (300 mg total) by mouth 2 (two)  times daily. 03/02/21  Yes Bensimhon, Shaune Pascal, MD  losartan (COZAAR) 50 MG tablet Take 1 tablet (50 mg total) by mouth daily. 06/24/19  Yes Bensimhon, Shaune Pascal, MD  magnesium oxide (MAG-OX) 400 MG tablet Take 1 tablet (400 mg total) by mouth 2 (two) times daily. 06/24/19  Yes Bensimhon, Shaune Pascal, MD  Misc Natural Products (ELDERBERRY IMMUNE COMPLEX) CHEW Chew 2 capsules by mouth daily.   Yes [provider]  oxyCODONE (OXY IR/ROXICODONE) 5 MG immediate release tablet Take 5 mg by mouth daily as needed for severe pain.   Yes [provider]  pantoprazole (PROTONIX) 40 MG tablet Take 1 tablet (40 mg total) by mouth daily. 07/15/20  Yes Bensimhon, Shaune Pascal, MD  potassium chloride SA (KLOR-CON M20) 20 MEQ tablet Take 1 tablet (20 mEq total) by mouth daily. KEEP OV. 03/03/21  Yes Bensimhon, Shaune Pascal, MD  traZODone (DESYREL) 50 MG tablet TAKE 1 TABLET BY MOUTH ONCE DAILY AT BEDTIME Patient taking differently: Take 50 mg by mouth at bedtime. 02/04/21  Yes Bensimhon, Shaune Pascal, MD  warfarin (COUMADIN) 2 MG tablet TAKE 2 TABS BY MOUTH EVERY MONDAY AND FRIDAY, AND TAKE 1 TAB ALL OTHER DAYS OR AS DIRECTED BY HF CLINIC Patient taking differently: 2-4 mg daily at 4 PM. 2 mg on all days except on Friday pt takes 4 mg. ( 2 tablets)  03/02/21  Yes Bensimhon, Shaune Pascal, MD  fluticasone (FLONASE) 50 MCG/ACT nasal spray Place 1 spray into both nostrils daily. Patient not taking: Reported on 06/05/2021 10/05/20   Bensimhon, Shaune Pascal, MD      Allergies    Plavix [clopidogrel bisulfate]    Review of Systems   Review of Systems  Unable to perform ROS: Mental status change   Physical Exam Updated Vital Signs BP (!) 121/103    Pulse 80    Temp 98.2 F (36.8 C) (Oral)    Resp (!) 22    Wt 82.1 kg    SpO2 94%    BMI 30.12 kg/m  Physical Exam Vitals and nursing note reviewed.  Constitutional:      Appearance: He is well-developed.  HENT:     Head: Normocephalic and atraumatic.     Comments: No scalp  tenderness Eyes:     Extraocular Movements: Extraocular movements intact.     Pupils: Pupils are equal, round, and reactive to light.  Neck:     Comments: Slowly moves neck back and forth, no specific stiffness Cardiovascular:     Comments: LVAD hum Pulmonary:     Effort: Pulmonary effort is normal.     Breath sounds: Normal breath sounds.  Abdominal:     General: There is no distension.     Palpations: Abdomen is soft.     Tenderness: There is no abdominal tenderness.  Musculoskeletal:     Cervical back: No rigidity or tenderness.  Skin:    General: Skin is warm and dry.  Neurological:     Mental Status: He is lethargic.     Comments: Awake, alert, oriented to place but disoriented to time. Equal strength in all 4 extremities    ED Results / Procedures / Treatments   Labs (all labs ordered are listed, but only abnormal results are displayed) Labs Reviewed  COMPREHENSIVE METABOLIC PANEL - Abnormal; Notable for the following components:      Result Value   CO2 21 (*)    Glucose, Bld 150 (*)    Creatinine, Ser 1.64 (*)    Albumin 3.3 (*)    GFR, Estimated 48 (*)    All other components within normal limits  CBC WITH DIFFERENTIAL/PLATELET - Abnormal; Notable for the following components:   WBC 11.4 (*)    RBC 5.96 (*)    Neutro Abs 10.1 (*)    Lymphs Abs 0.6 (*)    All other components within normal limits  PROTIME-INR - Abnormal; Notable for the following components:   Prothrombin Time 26.0 (*)    INR 2.4 (*)    All other components within normal limits  CBG MONITORING, ED - Abnormal; Notable for the following components:   Glucose-Capillary 148 (*)    All other components within normal limits  TROPONIN I (HIGH SENSITIVITY) - Abnormal; Notable for the following components:   Troponin I (High Sensitivity) 25 (*)    All other components within normal limits  RESP PANEL BY RT-PCR (FLU A&B, COVID) ARPGX2  LACTIC ACID, PLASMA  AMMONIA  ETHANOL  URINALYSIS, ROUTINE W  REFLEX MICROSCOPIC  RAPID URINE DRUG SCREEN, HOSP PERFORMED  PROTIME-INR  TYPE AND SCREEN  TROPONIN I (HIGH SENSITIVITY)    EKG None  Radiology CT Head Wo Contrast  Result Date: 06/05/2021 CLINICAL DATA:  Mental status change with unknown cause EXAM: CT HEAD WITHOUT CONTRAST TECHNIQUE: Contiguous axial images were obtained from the base of the skull through the vertex without intravenous  contrast. RADIATION DOSE REDUCTION: This exam was performed according to the departmental dose-optimization program which includes automated exposure control, adjustment of the mA and/or kV according to patient size and/or use of iterative reconstruction technique. COMPARISON:  None. FINDINGS: Brain: Blood clot centered at the right caudothalamic groove measuring approximately 2.3 cm. Decompression into the right lateral ventricle and extending into the third and fourth ventricles. Right more than left lateral ventricular dilatation with leftward shift of the septum pellucidum and asymmetric ballooning of the right sided horns. There is some periventricular low-density that could be chronic small-vessel disease or periventricular flow. Remote small cortically based infarct in the high left frontal lobe. Vascular: No hyperdense vessel or unexpected calcification. Skull: Normal. Negative for fracture or focal lesion. Sinuses/Orbits: No acute finding. Other: Critical Value/emergent results were called by telephone at the time of interpretation on 06/05/2021 at 7:36 am to provider Dr Regenia Skeeter, who verbally acknowledged these results. IMPRESSION: Acute hemorrhage originating at the right caudothalamic groove with intraventricular clot in the right lateral ventricle to fourth ventricle. Obstructive changes at the right lateral ventricle which is asymmetrically ballooned. Electronically Signed   By: Jorje Guild M.D.   On: 06/05/2021 07:37    Procedures .Critical Care Performed by: Sherwood Gambler, MD Authorized by:  Sherwood Gambler, MD   Critical care provider statement:    Critical care time (minutes):  45   Critical care was necessary to treat or prevent imminent or life-threatening deterioration of the following conditions:  CNS failure or compromise   Critical care was time spent personally by me on the following activities:  Development of treatment plan with patient or surrogate, discussions with consultants, evaluation of patient's response to treatment, examination of patient, ordering and review of laboratory studies, ordering and review of radiographic studies, ordering and performing treatments and interventions, pulse oximetry, re-evaluation of patient's condition and review of old charts    Medications Ordered in ED Medications  ondansetron (ZOFRAN) 4 MG/2ML injection (  Given 06/05/21 0747)  phytonadione (VITAMIN K) 10 mg in dextrose 5 % 50 mL IVPB (0 mg Intravenous Stopped 06/05/21 0851)  prothrombin complex conc human (KCENTRA) IVPB 2,220 Units (2,220 Units Intravenous New Bag/Given 06/05/21 0858)    ED Course/ Medical Decision Making/ A&P Clinical Course as of 06/05/21 0936  Sat Jun 05, 2021  0800 I discussed with Dr. Haroldine Laws.  Patient is able to get Coumadin reversal given the neurologic catastrophe.  Kcentra and vitamin K will be ordered. [SG]  [id number] Dr. Arnoldo Morale will evaluate patient.  He likely needs a EVD.  Currently patient is sleepy but maintaining his airway and can open his eyes to command.  I discussed with wife that intubation is a real possibility though I do not think he emergently needs a right now. [SG]    Clinical Course User Index [SG] Sherwood Gambler, MD                           Medical Decision Making Problems Addressed: Intraparenchymal hemorrhage of brain [facility]): complicated acute illness or injury with systemic symptoms that poses a threat to life or bodily functions  Amount and/or Complexity of Data Reviewed Independent Historian: spouse Labs:  ordered. Radiology: ordered and independent interpretation performed.  Risk Decision regarding hospitalization.   Patient is somnolent but easily arousable on arrival.  However he is confused/disoriented.  No focal deficits.  Unfortunately, head CT shows intraparenchymal hemorrhage as above with obstructive hydrocephalus.  I have personally  reviewed these images.  See above notes for discussions with cardiology and neurosurgery.  Coumadin was reversed with Kcentra and vitamin K.  He has remained somnolent but easily awakens and opens his eyes and follows some commands.  Given this I do not think he needs emergent intubation.  His INR is in the therapeutic range but was reversed as above.  Otherwise, slight leukocytosis is likely reactive.  His vital signs have remained stable in the emergency department.  He did vomit once but otherwise is protecting airway.  I have discussed multiple times with his wife at the bedside.  He will be taken to the ICU under the cardiology service.        Final Clinical Impression(s) / ED Diagnoses Final diagnoses:  Intraparenchymal hemorrhage of brain [facility])    Rx / DC Orders ED Discharge Orders     None         Sherwood Gambler, MD 06/05/21 1002

## 2021-06-05 NOTE — Progress Notes (Signed)
VAD Coordinator Procedure Note:   VAD Coordinator met patient in 2H25. Pt undergoing right frontal ventriculostomy via burr hole per Dr. Arnoldo Morale at bedside. Hemodynamics and VAD parameters monitored by myself throughout the procedure. Blood pressures were obtained with automatic cuff on right arm.    Time: Doppler Auto  BP Flow PI Power Speed  Pre-procedure:  1015  115/95 (102) 4.2 6.5 4.3 5400   1030  118/108 (113) 4.4 6.3 4.2 5400           Procedure start: 1040  116/99 (105)       1045  119/104 (111) 4.2 6.5 4.2 5400   1100  104/88 (96) 4.5 5.9 4.2 5400   1115  121/105 (112) 4.4 6.4 4.3 5400  Procedure end: 1120  121/88 (100)  4.3 6.3 4.2 5400             Patient Disposition: Patient tolerated the procedure well. VAD Coordinator accompanied and remained with patient during procedure. Per Dr Arnoldo Morale MAP goal 70-80. Discussed with Dr Haroldine Laws. Will give Hydralazine 10 mg IV now for elevated blood pressure.    Updated pt's family in [facility] waiting room. All questions answered at this time.   Emerson Monte RN [place] Coordinator  Office: [phone]  24/7 Pager: [phone]

## 2021-06-05 NOTE — Op Note (Signed)
Brief history: The patient is a 56 year old black male with a history of congestive heart failure with LVAD in place on Coumadin.  He presented to the ER with a right basal ganglia hemorrhage, intraventricular hemorrhage and obstructive hydrocephalus.  The patient's Coumadin was reversed.  I recommend placement of a ventriculostomy.  I explained the risk, benefits, alternatives, expected postoperative course, and likelihood of achieving our goals with surgery.  I have answered all the patient's wife's questions.  She has consented on his behalf.  Preop diagnosis: Basal ganglia hemorrhage, intraventricular hemorrhage, obstructive hydrocephalus  Postop diagnosis: The same  Procedure: Placement of right frontal ventriculostomy via bur hole  Surgeon: Dr. Earle Gell  Assistant: None  Anesthesia: Local  Estimated blood loss: 50 cc  Specimens: None  Complications: None  Description of procedure: A timeout was performed.  I shaved the patient's right scalp with the clippers.  This region was prepared with Betadine solution and DuraPrep.  Sterile drapes were applied.  I then injected the area to be incised with lidocaine with epinephrine solution.  I used the scalpel to make a linear incision in the mid pupillary line at approximately the coronal suture.  I used a self-retaining retractor for exposure.  I then used the eggbeater drill to create a right frontal burr hole.  I incised the underlying dura.  I then cannulated the patient's ventricular system on the first pass but the catheter immediately filled with blood.  I remove the catheter and on the second pass got more clear spinal fluid.  I tunneled the catheter underneath the patient's scalp.  I sutured the catheter in place.  I connected the ventricular catheter to the drainage system and secured the connection with silk suture.  A sterile dressing was applied.  I then removed the drapes.  The patient tolerated the procedure well.  There was good  flow of CSF through the catheter/drainage system.  I spoke with his family.

## 2021-06-05 NOTE — H&P (Signed)
LVAD H&P HF MD: Dr Haroldine Laws  HPI:  David [name] is a 56 y.o. male with history of ICD (09 PCI to LAD, mRCA, '10 PCI to Lcx and '12 mRCA), HFrEF (EF ~20% for > 5 years), HLD and HTN. S/P LVAD HM-III 04/20/18.    Readmitted 2/19 hgb 3.8. INR 2.9.transfused 4u pRBCs. 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  CT showed intracerebral and intraventricular hemorrhage with obstructive hydrocephalus. INR 2.4  Given Kcentra and vit K.   Remains sleepy but wakes up and talks and follows commands. MAPs mildly elevated    VAD Indication: Destination therapy- CKD   VAD interrogation & Equipment Management: Speed: 5400 Flow: 4.4 Power: 4.2w    PI: 5.4   Events: 20+ PI events daily  Fixed speed 5400 Low speed limit: 5100   New Primary Controller:  Replace back up battery in 32 months Back up controller:  Replace back up battery in 12 months    Past Medical History:  Diagnosis Date   AICD (automatic cardioverter/defibrillator) present    Anemia    Anxiety    CAD (coronary artery disease) 2009   AMI in 12/2007 with PCI to LAD, staged PCI to  mid/distal RCA, NSTEMI in 02/2009 with BMS to LCx   CHF (congestive heart failure) ([place])    Chronic kidney disease 11/03/2016   stage 3 kidney disease   Dysrhythmia    "arrythmia problems at some point", "fatal rhythms"   History of blood transfusion    "I was bleeding from was rectum" (04/19/2017)   HLD (hyperlipidemia)    HTN (hypertension)    Ischemic cardiomyopathy    Admitted in 07/2010 with CHF exacerbation   Myocardial infarction ([place])    "I've had 4" (04/19/2017)   Pneumonia 2018 X 1   Seizures ([place])    one seizure in 04/2016 during cardiac event (04/19/2017)    No current facility-administered medications for this encounter.    Plavix [clopidogrel bisulfate]  Review of systems  complete and found to be negative unless listed in HPI.      Vitals:   06/05/21 0800 06/05/21 0815  BP: (!) 122/111 (!) 121/103  Pulse: 84 80  Resp: (!) 22 (!) 22  Temp:    SpO2: (!) 87% 94%    Wt Readings from Last 3 Encounters:  06/05/21 82.1 kg  05/03/21 83.1 kg  02/08/21 82.3 kg      Physical Exam: General:  Lethargic NAD.  HEENT: normal  Neck: supple. JVP not elevated.  Carotids 2+ bilat; no bruits. No lymphadenopathy or thryomegaly appreciated. Cor: LVAD hum.  Lungs: Clear. Abdomen: obese soft, nontender, non-distended. No hepatosplenomegaly. No bruits or masses. Good bowel sounds. Driveline site clean. Anchor in place.  Extremities: no cyanosis, clubbing, rash. Warm no edema  Neuro: lethargic but  & oriented x 3. No focal deficits. Moves all 4 without problem    ASSESSMENT AND PLAN:  1. Intracranial hemorrhage - seen by NSU - CT brain with intracerebral and intraventricular hemorrhage with obstructive hydrocephalus. INR 2.4 - AC reversed with Kcentra/vit k  - Plan for bedside ventriculostomy this am - Appreciate NSU input  2. Chronic systolic CHF with HM III LVAD implant 04/20/17:  - Has done well with VAD support. - NYHA I  - Volume status looks good today - He has been seen at [facility]  for transplant referral but has decided not to pursue VAD. No change   3. Paroxysmal AFL with RVR - had DC-CV on 10/17/18 with subsequent short episodes of AF after but has been mostly in NSR on low-dose amio (100 daily) - Had recurrent AF in 10/22. Amio increased to 200 bid and converted.  - Now in NSR. Continue amio 200 daily   3. h/o UGI AVM bleed - s/p APC x 4 lesions in 2/19 - No recurrent bleeding  - Continue PPI - Off ASA due to GIB.  - Hgb stable at 16.0  4. CKD Stage IIIa:  - Creatinine baseline 1.3 - 1.7  - Creatinine 1.64 today - Continue to follow - Consider SGLT2i  5. VAD management - VAD interrogated personally. Parameters stable. - LDH has been  stable  - INR goal 2.0-2.5 Off ASA due to GIB 2/19.  - INR 2.4  -> now reversed due to [place] - MAPs high. Manage per NSU - DL site looks good  6. CAD:  s/p PCI RCA/PLOM with DES x2 and DES to mild LAD 1/18 - Nos/s angina - Continue statin. Off ASA due to GIB  7. Hypomagnesemia. -  Continue mag supplements - Check level next visit  CRITICAL CARE Performed by: Glori Bickers  Total critical care time: 35 minutes  Critical care time was exclusive of separately billable procedures and treating other patients.  Critical care was necessary to treat or prevent imminent or life-threatening deterioration.  Critical care was time spent personally by me (independent of midlevel providers or residents) on the following activities: development of treatment plan with patient and/or surrogate as well as nursing, discussions with consultants, evaluation of patient's response to treatment, examination of patient, obtaining history from patient or surrogate, ordering and performing treatments and interventions, ordering and review of laboratory studies, ordering and review of radiographic studies, pulse oximetry and re-evaluation of patient's condition.   Glori Bickers, MD  10:06 AM

## 2021-06-05 NOTE — Progress Notes (Signed)
2110- Upon initial assessment, ventriculostomy drainage system was set to 10 cmH20. Order per Viona Gilmore, NP today at [phone] states to raise intraventricular catheter to 15 CmH20. This was performed at this time.  [place] neurosurgery called at this time due to pt's neuro status change. Upon my first assessment he was alert and conversational. At this time he is increasingly drowsy and less willing to communicate. Awaiting provider response.  2238Viona Gilmore, NP updated on patient status. States she will communicate with Dr. Arnoldo Morale about possible CT scan and will place orders if desired.  2330- transported to CT by myself and transport. 0010- arrived back to [place]. At this time patient is alert and talking. Remains disoriented to year and complete situation.  0530- Pt bathed at this time. Wife wanted to ensure he was bathed. Pt is more lucid at this time. He is oriented to self, time, place, and some of his situation.  0700- Pt LOC unchanged since 0530.

## 2021-06-05 NOTE — Progress Notes (Signed)
LVAD Coordinator ED Encounter  David [name] a 56 y.o. male that presented to [facility] ER today due to severe headache, neck pain, weakness, and confusion. He has a past medical history  has a past medical history of AICD (automatic cardioverter/defibrillator) present, Anemia, Anxiety, CAD (coronary artery disease) (2009), CHF (congestive heart failure) ([place]), Chronic kidney disease (11/03/2016), Dysrhythmia, History of blood transfusion, HLD (hyperlipidemia), HTN (hypertension), Ischemic cardiomyopathy, Myocardial infarction ([place]), Pneumonia (2018 X 1), and Seizures ([place]).Marland Kitchen   LVAD is a HM III and was implanted on 04/20/17 by Dr Cyndia Bent.   Pt presented to ER with headache and neck pain that started 2 days ago, and worsened last night. Last night prior to going to bed, pt appeared to be "off-balance" but said he was ok, and went to sleep. Early this morning he woke up vomiting, weak, and disoriented. Upon my assessment he is lethargic, but will open eyes to his name. Alert and oriented to person and place, but drifts back to sleep quickly. VAD numbers and vital signs stable.    Stat head CT showed: Acute hemorrhage originating at the right caudothalamic groove withintraventricular clot in the right lateral ventricle to fourth ventricle. Obstructive changes at the right lateral ventricle which is asymmetrically ballooned.  Plan for Dr Arnoldo Morale with neurosurgery to place right frontal ventriculostomy via burr hole at bedside once in ICU. Pt received K-centra and Vitamin K in ER for INR 2.4.   Pt's wife, mother-in law, daughters, and brother in ER. Updated them on plan for patient, and brought them up to [facility] waiting area.   Vital signs: HR: 80 Doppler MAP:  Automated BP: 121/103 (111) O2 Sat: 96% on 2L  LVAD interrogation reveals:  Speed: 5400 Flow: 4.4 Power:  4.2 PI: 5.4  Alarms: none Events: 5 PI events so far today   Drive Line: Weekly dressing change per pt's wife. Current dressing clean,  dry, and intact.   Significant Events with LVAD:   Updated VAD Providers (Dr Haroldine Laws) about the above. No LVAD issues and pump is functioning as expected. Able to independently manage LVAD equipment. No LVAD needs at this time.   Emerson Monte RN [place] Coordinator  Office: [phone]  24/7 Pager: [phone]

## 2021-06-06 DIAGNOSIS — I255 Ischemic cardiomyopathy: Secondary | ICD-10-CM | POA: Diagnosis not present

## 2021-06-06 DIAGNOSIS — Z95811 Presence of heart assist device: Secondary | ICD-10-CM

## 2021-06-06 DIAGNOSIS — I629 Nontraumatic intracranial hemorrhage, unspecified: Secondary | ICD-10-CM | POA: Diagnosis not present

## 2021-06-06 DIAGNOSIS — I5022 Chronic systolic (congestive) heart failure: Secondary | ICD-10-CM | POA: Diagnosis not present

## 2021-06-06 LAB — CBC
HCT: 48.3 % (ref 39.0–52.0)
Hemoglobin: 15.1 g/dL (ref 13.0–17.0)
MCH: 26 pg (ref 26.0–34.0)
MCHC: 31.3 g/dL (ref 30.0–36.0)
MCV: 83.1 fL (ref 80.0–100.0)
Platelets: 179 10*3/uL (ref 150–400)
RBC: 5.81 MIL/uL (ref 4.22–5.81)
RDW: 14.8 % (ref 11.5–15.5)
WBC: 12.1 10*3/uL — ABNORMAL HIGH (ref 4.0–10.5)
nRBC: 0 % (ref 0.0–0.2)

## 2021-06-06 LAB — BASIC METABOLIC PANEL
Anion gap: 11 (ref 5–15)
BUN: 14 mg/dL (ref 6–20)
CO2: 21 mmol/L — ABNORMAL LOW (ref 22–32)
Calcium: 9.2 mg/dL (ref 8.9–10.3)
Chloride: 108 mmol/L (ref 98–111)
Creatinine, Ser: 1.41 mg/dL — ABNORMAL HIGH (ref 0.61–1.24)
GFR, Estimated: 58 mL/min — ABNORMAL LOW (ref >60)
Glucose, Bld: 93 mg/dL (ref 70–99)
Potassium: 4.1 mmol/L (ref 3.5–5.1)
Sodium: 140 mmol/L (ref 135–145)

## 2021-06-06 LAB — LACTATE DEHYDROGENASE: LDH: 195 U/L — ABNORMAL HIGH (ref 98–192)

## 2021-06-06 LAB — PROTIME-INR
INR: 1 (ref 0.8–1.2)
Prothrombin Time: 13.5 seconds (ref 11.4–15.2)

## 2021-06-06 MED ORDER — TRAMADOL HCL 50 MG PO TABS
50.0000 mg | ORAL_TABLET | Freq: Four times a day (QID) | ORAL | Status: DC | PRN
  Administered 2021-06-06 – 2021-06-08 (×4): 50 mg via ORAL
  Filled 2021-06-06 (×4): qty 1

## 2021-06-06 MED ORDER — HYDRALAZINE HCL 25 MG PO TABS
25.0000 mg | ORAL_TABLET | Freq: Three times a day (TID) | ORAL | Status: DC
  Administered 2021-06-06 – 2021-06-08 (×6): 25 mg via ORAL
  Filled 2021-06-06 (×6): qty 1

## 2021-06-06 NOTE — Progress Notes (Signed)
Subjective: The patient is alert and pleasant.  He complains of a headache.  He is in no apparent distress.  Objective: Vital signs in last 24 hours: Temp:  [98.5 F (36.9 C)-99.2 F (37.3 C)] 98.9 F (37.2 C) (02/12 0751) Pulse Rate:  [76-113] 88 (02/12 0830) Resp:  [15-30] 17 (02/12 0830) BP: (72-127)/(58-111) 95/79 (02/12 0830) SpO2:  [90 %-98 %] 93 % (02/12 0830) Weight:  [82.7 kg] 82.7 kg (02/12 0500) Estimated body mass index is 30.34 kg/m as calculated from the following:   Height as of this encounter: 5\' 5"  (1.651 m).   Weight as of this encounter: 82.7 kg.   Intake/Output from previous day: 02/11 0701 - 02/12 0700 In: 0.8 [IV Piggyback:0.8] Out: 646 [Urine:525; Drains:121] Intake/Output this shift: Total I/O In: -  Out: 9 [Drains:9]  Physical exam the patient is alert and oriented.  He is moving all 4 extremities well.  His ventriculostomy is draining well.  I reviewed his follow-up head CT performed yesterday.  The caudate/intraventricular hemorrhage is slightly larger.  His ventricles are well decompressed with the ventriculostomy.  Lab Results: Recent Labs    06/05/21 0636 06/06/21 0044  WBC 11.4* 12.1*  HGB 16.0 15.1  HCT 49.5 48.3  PLT 185 179   BMET Recent Labs    06/05/21 0636 06/06/21 0044  NA 137 140  K 3.9 4.1  CL 105 108  CO2 21* 21*  GLUCOSE 150* 93  BUN 16 14  CREATININE 1.64* 1.41*  CALCIUM 9.3 9.2    Studies/Results: CT HEAD WO CONTRAST (5MM)  Result Date: 06/06/2021 CLINICAL DATA:  Mental status changes. Follow-up intracranial hemorrhage. EXAM: CT HEAD WITHOUT CONTRAST TECHNIQUE: Contiguous axial images were obtained from the base of the skull through the vertex without intravenous contrast. RADIATION DOSE REDUCTION: This exam was performed according to the departmental dose-optimization program which includes automated exposure control, adjustment of the mA and/or kV according to patient size and/or use of iterative  reconstruction technique. COMPARISON:  Earlier same day FINDINGS: Brain: Interval placement of a ventriculostomy from a right frontal approach, entering the posterior body of the left lateral ventricle. The tip appears to be just within the ventricle. The ventriculostomy is obviously functioning as there is resolution of the previously seen hydrocephalus. The intraparenchymal hematoma with the epicenter in the right caudothalamic groove region measures 3.5 x 3.0 x 3.0 cm, few mm larger than on the initial exam. Small amount of intraventricular blood dependent in the occipital horn of the right lateral ventricle has not increased. There is a small amount of blood in the fourth ventricle as seen previously. No new area of bleeding is seen. Vascular: No abnormal vascular finding. Skull: Otherwise negative. Sinuses/Orbits: Clear/normal Other: None IMPRESSION: Placement of a ventriculostomy from a right frontal approach, with good effect and resolution of previously seen hydrocephalus. Slight increase in size of the intraparenchymal hematoma at the right caudothalamic groove, now measuring 3.5 x 3.0 x 3.0 cm, a couple of mm larger than on the initial study. No increase in the amount of intraventricular blood. Electronically Signed   By: Nelson Chimes M.D.   On: 06/06/2021 00:00   CT Head Wo Contrast  Result Date: 06/05/2021 CLINICAL DATA:  Mental status change with unknown cause EXAM: CT HEAD WITHOUT CONTRAST TECHNIQUE: Contiguous axial images were obtained from the base of the skull through the vertex without intravenous contrast. RADIATION DOSE REDUCTION: This exam was performed according to the departmental dose-optimization program which includes automated exposure control, adjustment  of the mA and/or kV according to patient size and/or use of iterative reconstruction technique. COMPARISON:  None. FINDINGS: Brain: Blood clot centered at the right caudothalamic groove measuring approximately 2.3 cm. Decompression  into the right lateral ventricle and extending into the third and fourth ventricles. Right more than left lateral ventricular dilatation with leftward shift of the septum pellucidum and asymmetric ballooning of the right sided horns. There is some periventricular low-density that could be chronic small-vessel disease or periventricular flow. Remote small cortically based infarct in the high left frontal lobe. Vascular: No hyperdense vessel or unexpected calcification. Skull: Normal. Negative for fracture or focal lesion. Sinuses/Orbits: No acute finding. Other: Critical Value/emergent results were called by telephone at the time of interpretation on 06/05/2021 at 7:36 am to provider Dr Regenia Skeeter, who verbally acknowledged these results. IMPRESSION: Acute hemorrhage originating at the right caudothalamic groove with intraventricular clot in the right lateral ventricle to fourth ventricle. Obstructive changes at the right lateral ventricle which is asymmetrically ballooned. Electronically Signed   By: Jorje Guild M.D.   On: 06/05/2021 07:37    Assessment/Plan: Right caudate, intraventricular hemorrhage, obstructive hydrocephalus: The patient is doing much better today he is much more alert.  His ventriculostomy is patent.  LVAD: Although not ideal, I think it is okay to start heparin without a bolus today, as the benefits likely outweigh the risk.  LOS: 1 day     [name] 06/06/2021, 8:47 AM     Patient ID: David [name], male   DOB: 1965/12/30, 56 y.o.   MRN: [id number]

## 2021-06-06 NOTE — Progress Notes (Signed)
Advanced Heart Failure VAD Team Note  PCP-Cardiologist: Glori Bickers, MD   Subjective:    2/11 Presented with [place] 2/11 Underwent bedside ventriculostomy wit NSU 2/12 Repeat head CT with resolution of hydrocephalus. Persistent hematoma  More alert today. Still with HA.   INR 1.0. MAPs 80s with IV hydralazine  LVAD INTERROGATION:  HeartMate 3 LVAD:   Flow 4.9 liters/min, speed 5400, power 4.3, PI 3.8.    Objective:    Vital Signs:   Temp:  [98.6 F (37 C)-99.2 F (37.3 C)] 98.9 F (37.2 C) (02/12 0751) Pulse Rate:  [76-113] 94 (02/12 1100) Resp:  [14-30] 21 (02/12 1100) BP: (72-115)/(58-90) 98/85 (02/12 1100) SpO2:  [90 %-98 %] 93 % (02/12 1100) Weight:  [82.7 kg] 82.7 kg (02/12 0500)   Mean arterial Pressure 80s  Intake/Output:   Intake/Output Summary (Last 24 hours) at 06/06/2021 1127 Last data filed at 06/06/2021 1100 Gross per 24 hour  Intake --  Output 670 ml  Net -670 ml     Physical Exam    General:  More alert  No resp difficulty HEENT: normal + dressing Neck: supple. No JVP . Carotids 2+ bilat; no bruits. No lymphadenopathy or thyromegaly appreciated. Cor: Mechanical heart sounds with LVAD hum present. Lungs: clear Abdomen: soft, nontender, nondistended. No hepatosplenomegaly. No bruits or masses. Good bowel sounds. Driveline: C/D/I; securement device intact and driveline incorporated Extremities: no cyanosis, clubbing, rash, edema Neuro: alert & orientedx3, cranial nerves grossly intact. moves all 4 extremities w/o difficulty. Affect pleasant   Telemetry   NSR 80-90s Personally reviewed   Labs   Basic Metabolic Panel: Recent Labs  Lab 06/05/21 0636 06/06/21 0044  NA 137 140  K 3.9 4.1  CL 105 108  CO2 21* 21*  GLUCOSE 150* 93  BUN 16 14  CREATININE 1.64* 1.41*  CALCIUM 9.3 9.2    Liver Function Tests: Recent Labs  Lab 06/05/21 0636  AST 29  ALT 29  ALKPHOS 114  BILITOT 0.7  PROT 7.6  ALBUMIN 3.3*   No results for  input(s): LIPASE, AMYLASE in the last 168 hours. Recent Labs  Lab 06/05/21 0646  AMMONIA 30    CBC: Recent Labs  Lab 06/05/21 0636 06/06/21 0044  WBC 11.4* 12.1*  NEUTROABS 10.1*  --   HGB 16.0 15.1  HCT 49.5 48.3  MCV 83.1 83.1  PLT 185 179    INR: Recent Labs  Lab 05/31/21 0000 06/05/21 0636 06/05/21 1220 06/06/21 0044  INR 2.8 2.4* 1.1 1.0    Other results:    Imaging   CT HEAD WO CONTRAST (5MM)  Result Date: 06/06/2021 CLINICAL DATA:  Mental status changes. Follow-up intracranial hemorrhage. EXAM: CT HEAD WITHOUT CONTRAST TECHNIQUE: Contiguous axial images were obtained from the base of the skull through the vertex without intravenous contrast. RADIATION DOSE REDUCTION: This exam was performed according to the departmental dose-optimization program which includes automated exposure control, adjustment of the mA and/or kV according to patient size and/or use of iterative reconstruction technique. COMPARISON:  Earlier same day FINDINGS: Brain: Interval placement of a ventriculostomy from a right frontal approach, entering the posterior body of the left lateral ventricle. The tip appears to be just within the ventricle. The ventriculostomy is obviously functioning as there is resolution of the previously seen hydrocephalus. The intraparenchymal hematoma with the epicenter in the right caudothalamic groove region measures 3.5 x 3.0 x 3.0 cm, few mm larger than on the initial exam. Small amount of intraventricular blood dependent in the  occipital horn of the right lateral ventricle has not increased. There is a small amount of blood in the fourth ventricle as seen previously. No new area of bleeding is seen. Vascular: No abnormal vascular finding. Skull: Otherwise negative. Sinuses/Orbits: Clear/normal Other: None IMPRESSION: Placement of a ventriculostomy from a right frontal approach, with good effect and resolution of previously seen hydrocephalus. Slight increase in size of  the intraparenchymal hematoma at the right caudothalamic groove, now measuring 3.5 x 3.0 x 3.0 cm, a couple of mm larger than on the initial study. No increase in the amount of intraventricular blood. Electronically Signed   By: Nelson Chimes M.D.   On: 06/06/2021 00:00   CT Head Wo Contrast  Result Date: 06/05/2021 CLINICAL DATA:  Mental status change with unknown cause EXAM: CT HEAD WITHOUT CONTRAST TECHNIQUE: Contiguous axial images were obtained from the base of the skull through the vertex without intravenous contrast. RADIATION DOSE REDUCTION: This exam was performed according to the departmental dose-optimization program which includes automated exposure control, adjustment of the mA and/or kV according to patient size and/or use of iterative reconstruction technique. COMPARISON:  None. FINDINGS: Brain: Blood clot centered at the right caudothalamic groove measuring approximately 2.3 cm. Decompression into the right lateral ventricle and extending into the third and fourth ventricles. Right more than left lateral ventricular dilatation with leftward shift of the septum pellucidum and asymmetric ballooning of the right sided horns. There is some periventricular low-density that could be chronic small-vessel disease or periventricular flow. Remote small cortically based infarct in the high left frontal lobe. Vascular: No hyperdense vessel or unexpected calcification. Skull: Normal. Negative for fracture or focal lesion. Sinuses/Orbits: No acute finding. Other: Critical Value/emergent results were called by telephone at the time of interpretation on 06/05/2021 at 7:36 am to provider Dr Regenia Skeeter, who verbally acknowledged these results. IMPRESSION: Acute hemorrhage originating at the right caudothalamic groove with intraventricular clot in the right lateral ventricle to fourth ventricle. Obstructive changes at the right lateral ventricle which is asymmetrically ballooned. Electronically Signed   By: Jorje Guild M.D.   On: 06/05/2021 07:37     Medications:     Scheduled Medications:  amiodarone  200 mg Oral Daily   atorvastatin  40 mg Oral Daily   Chlorhexidine Gluconate Cloth  6 each Topical Daily   magnesium oxide  400 mg Oral BID   pantoprazole  40 mg Oral Daily    Infusions:   PRN Medications: acetaminophen, hydrALAZINE, ondansetron (ZOFRAN) IV    Assessment/Plan:    1. Intracranial hemorrhage - seen by NSU - CT brain with intracerebral and intraventricular hemorrhage with obstructive hydrocephalus. INR 2.4 - AC reversed with Kcentra/vit k in ER - Underwent bedside ventriculostomy on 2/11 - Repeat head CT with resolution of hydrocephalus. Persistent hematoma - Appreciate NSU input - INR 1.0 today - With HM-3 device ok to continue to hold [facility] for at least a few more days (or longer) without too much risk   2. Chronic systolic CHF with HM III LVAD implant 04/20/17:  - Has done well with VAD support. - NYHA I  - Volume status looks good - He has been seen at [facility] for transplant referral but has decided not to pursue VAD. No change   3. Paroxysmal AFL with RVR - had DC-CV on 10/17/18 with subsequent short episodes of AF after but has been mostly in NSR on low-dose amio (100 daily) - Had recurrent AF in 10/22. Amio increased to 200 bid and  converted.  - Now in NSR. Continue amio 200 daily - No change   4. h/o UGI AVM bleed - s/p APC x 4 lesions in 2/19 - No recurrent bleeding  - Continue PPI - Off ASA due to GIB.  - Hgb stable at 15.1   5. CKD Stage IIIa:  - Creatinine baseline 1.3 - 1.7  - Creatinine 1.41 today - Continue to follow - Consider SGLT2i   5. VAD management - VAD interrogated personally. Parameters stable. - LDH has been stable  - INR goal 2.0-2.5 Off ASA due to GIB 2/19.  - INR 2.4  -> now reversed due to [place]. INR 1.0 - MAPs high improving with IV hydralazine. Will start po  - DL site looks good   6. CAD:  s/p PCI RCA/PLOM with DES x2 and  DES to mild LAD 1/18 - Nos/s angina - Continue statin. Off ASA due to GIB   7. Hypomagnesemia. -  Continue mag supplements  CRITICAL CARE Performed by: Glori Bickers  Total critical care time: 35 minutes  Critical care time was exclusive of separately billable procedures and treating other patients.  Critical care was necessary to treat or prevent imminent or life-threatening deterioration.  Critical care was time spent personally by me (independent of midlevel providers or residents) on the following activities: development of treatment plan with patient and/or surrogate as well as nursing, discussions with consultants, evaluation of patient's response to treatment, examination of patient, obtaining history from patient or surrogate, ordering and performing treatments and interventions, ordering and review of laboratory studies, ordering and review of radiographic studies, pulse oximetry and re-evaluation of patient's condition.   I reviewed the LVAD parameters from today, and compared the results to the patient's prior recorded data.  No programming changes were made.  The LVAD is functioning within specified parameters.  The patient performs LVAD self-test daily.  LVAD interrogation was negative for any significant power changes, alarms or PI events/speed drops.  LVAD equipment check completed and is in good working order.  Back-up equipment present.   LVAD education done on emergency procedures and precautions and reviewed exit site care.  Length of Stay: 1  Glori Bickers, MD 06/06/2021, 11:27 AM  VAD Team --- VAD ISSUES ONLY--- Pager [phone] (7am - 7am)  Advanced Heart Failure Team  Pager [phone] (M-F; 7a - 5p)  Please contact [facility] for night-coverage after hours (5p -7a ) and weekends on [email or web address]

## 2021-06-06 NOTE — Consult Note (Signed)
NAME:  David [name], MRN:  [id number], DOB:  23-Jan-1966, LOS: 1 ADMISSION DATE:  06/05/2021, CONSULTATION DATE:  06/05/2021 REFERRING MD: Bensimhon , CHIEF COMPLAINT:  ICH   History of Present Illness:  56 year old man with LVAD and acute ICH  Started with cephalgia 2 nights ago and then developed dysequilibrium, vomiting, confusion and generalized weakness.   Heartmate III placed 12/19 as destination therapy.   Pertinent  Medical History   Past Medical History:  Diagnosis Date   AICD (automatic cardioverter/defibrillator) present    Anemia    Anxiety    CAD (coronary artery disease) 2009   AMI in 12/2007 with PCI to LAD, staged PCI to  mid/distal RCA, NSTEMI in 02/2009 with BMS to LCx   CHF (congestive heart failure) ([place])    Chronic kidney disease 11/03/2016   stage 3 kidney disease   Dysrhythmia    "arrythmia problems at some point", "fatal rhythms"   History of blood transfusion    "I was bleeding from was rectum" (04/19/2017)   HLD (hyperlipidemia)    HTN (hypertension)    Hyperlipidemia 03/15/2011   Ischemic cardiomyopathy    Admitted in 07/2010 with CHF exacerbation   Myocardial infarction ([place])    "I've had 4" (04/19/2017)   Pneumonia 2018 X 1   Seizures ([place])    one seizure in 04/2016 during cardiac event (04/19/2017)   STEMI 2009 (anterior), 2010 (lateral), and 2012 (inferior) 03/14/2011     Significant Hospital Events: Including procedures, antibiotic start and stop dates in addition to other pertinent events   2/11 - Found to have right basal ganglia hemorrhage with intraventricular extension.  2/11- EVD placed  Interim History / Subjective:  Much more awake today. Still complains of improving headache.   Objective   Blood pressure 92/72, pulse 94, temperature 98.6 F (37 C), temperature source Oral, resp. rate 19, height 5\' 5"  (1.651 m), weight 82.7 kg, SpO2 95 %.        Intake/Output Summary (Last 24 hours) at 06/06/2021 1332 Last data filed at  06/06/2021 1200 Gross per 24 hour  Intake 240 ml  Output 648 ml  Net -408 ml    Filed Weights   06/05/21 0800 06/05/21 1000 06/06/21 0500  Weight: 82.1 kg 82.7 kg 82.7 kg    Examination: General: appears chronically unwell. Appears well nourished.  HENT: no icterus.  Lungs: clear to auscultation.  Cardiovascular: no JVD. LV hum. Abdomen: soft, driveline intact.  Extremities: no edema. Neuro: awake with normal speech. No noticeable weakness. EVD in place  GU: no foley  Ancillary tests personally reviewed:  Repeat CT shows improved hydrocephalus with less shift.  Creatinine 1.64   Assessment & Plan:  Active Problems:   Chronic systolic heart failure (HCC)   CKD (chronic kidney disease), stage III (HCC)   Ischemic cardiomyopathy   Presence of left ventricular assist device (LVAD) (HCC)   Left ventricular assist device present ([place])   ICH (intracerebral hemorrhage) (HCC) ICH score: 1 - 13% mortality  Plan:   - Anticoagulation reversed in ED with K-Centra.  - Keep MAP < 90 - EVD placement by neurosurgery - Run VAD with no anticoagulation at least until EVD is out.  - Euvolemic at present.     Best Practice (right click and "Reselect all SmartList Selections" daily)   Diet/type: regular  diet.  DVT prophylaxis: SCD GI prophylaxis: N/A Lines: N/A Foley:  N/A Code Status:  full code no chest compression due to VAD Last  date of multidisciplinary goals of care discussion [per HF]  Kipp Brood, MD [facility] ICU Physician [place]  Pager: [phone] Or Epic Secure Chat After hours: [phone].  06/06/2021, 1:32 PM

## 2021-06-06 NOTE — Evaluation (Signed)
Physical Therapy Evaluation Patient Details Name: David [name] MRN: [id number] DOB: [birth year] Today's Date: 06/06/2021  History of Present Illness  Pt is a 56 y.o. male who presented 06/05/21 with worsening HA. CT showed right basal ganglia hemorrhage, intraventricular hemorrhage and obstructive hydrocephalus. S/p placement of right frontal ventriculostomy via bur hole 2/11. 2/12 Repeat head CT with resolution of hydrocephalus. 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 PTA, he was independent without DME, with no hx of falls, living with his wife in a 1-level bottom floor apartment. Pt does not work or drive. Currently, he displays [facility] and fairly symmetrical bil upper and lower extremity strength and sensation. He does display some very mild balance deficits, suggested by his slow gait pace, but I expect this to improve as less lines need to be managed and as he continues to mobilize. Pt with primary deficits in cognition, specifically attention and processing speed. Will continue to follow acutely to maximize pt's return to baseline prior to return home, but anticipate he will not need PT follow-up after d/c.     Recommendations for follow up therapy are one component of a multi-disciplinary discharge planning process, led by the attending physician.  Recommendations may be updated based on patient status, additional functional criteria and insurance authorization.  Follow Up Recommendations No PT follow up    Assistance Recommended at Discharge Frequent or constant Supervision/Assistance  Patient can return home with the following  A little help with bathing/dressing/bathroom;Assistance with cooking/housework;Direct supervision/assist for financial management;Direct supervision/assist for medications management;Assist for transportation    Equipment  Recommendations None recommended by PT  Recommendations for Other Services  OT consult    Functional Status Assessment Patient has had a recent decline in their functional status and demonstrates the ability to make significant improvements in function in a reasonable and predictable amount of time.     Precautions / Restrictions Precautions Precautions: Fall;Other (comment) Precaution Comments: LVAD (x4 year hx), but no vest as pt puts batteries in back pocket; EVD Restrictions Weight Bearing Restrictions: No      Mobility  Bed Mobility Overal bed mobility: Needs Assistance Bed Mobility: Supine to Sit     Supine to sit: Min assist, HOB elevated     General bed mobility comments: HOB elevated, extra time with tactile cues and light minA to bring trunk up to sit R EOB.    Transfers Overall transfer level: Needs assistance Equipment used: None Transfers: Sit to/from Stand Sit to Stand: Min assist, +2 safety/equipment           General transfer comment: Very light minA to steady pt with slow power up to stand from EOB 1x and recliner 1x.    Ambulation/Gait Ambulation/Gait assistance: Min guard, +2 safety/equipment Gait Distance (Feet): 60 Feet (x2 bouts of ~3 ft > ~60 ft) Assistive device: None Gait Pattern/deviations: Step-through pattern, Decreased stride length, Trunk flexed Gait velocity: reduced Gait velocity interpretation: <1.31 ft/sec, indicative of household ambulator   General Gait Details: Pt with slightly flexed trunk and inferior gaze, needing cues to look superiorly and correct, momentary success. No LOB, ambulating around sides of bed with +2 to manage lines, but not physical assistance. Deferred hallway mobility due to pt notmally places batteries of LVAD in back pockets but is wearing gown today.  Stairs  Wheelchair Mobility    Modified Rankin (Stroke Patients Only) Modified Rankin (Stroke Patients Only) Pre-Morbid Rankin Score: No  symptoms Modified Rankin: Moderate disability     Balance Overall balance assessment: Mild deficits observed, not formally tested                                           Pertinent Vitals/Pain Pain Assessment Pain Assessment: Faces Faces Pain Scale: No hurt Pain Intervention(s): Monitored during session    Home Living Family/patient expects to be discharged to:: Private residence Living Arrangements: Spouse/significant other Available Help at Discharge: Family;Available 24 hours/day (wife is gone ~2 hrs/day but if needed can get someone to stay with him while she is gone) Type of Home: Apartment Home Access: Level entry       Home Layout: One level Home Equipment: None      Prior Function Prior Level of Function : Independent/Modified Independent             Mobility Comments: Does not use an AD. Denies any falls. ADLs Comments: Pt no longer works or drives.     Hand Dominance        Extremity/Trunk Assessment   Upper Extremity Assessment Upper Extremity Assessment: Defer to OT evaluation;Overall WFL for tasks assessed (MMT scores of 4+ to 5 bil grossly; sensation intact bil)    Lower Extremity Assessment Lower Extremity Assessment: Overall WFL for tasks assessed (MMT scores of 4+ to 5 bil grossly; sensation intact bil)    Cervical / Trunk Assessment Cervical / Trunk Assessment: Normal  Communication   Communication: No difficulties  Cognition Arousal/Alertness: Awake/alert Behavior During Therapy: Flat affect Overall Cognitive Status: Impaired/Different from baseline Area of Impairment: Attention, Awareness, Problem solving                   Current Attention Level: Sustained       Awareness: Emergent Problem Solving: Slow processing, Decreased initiation, Difficulty sequencing, Requires verbal cues General Comments: Pt with flat affect and slow processing, needing cues to initiate sequence of movements at times.         General Comments General comments (skin integrity, edema, etc.): VSS on supplemental O2    Exercises     Assessment/Plan    PT Assessment Patient needs continued PT services  PT Problem List Decreased activity tolerance;Decreased balance;Decreased mobility;Decreased cognition;Cardiopulmonary status limiting activity       PT Treatment Interventions DME instruction;Gait training;Functional mobility training;Therapeutic activities;Balance training;Therapeutic exercise;Neuromuscular re-education;Cognitive remediation;Patient/family education    PT Goals (Current goals can be found in the Care Plan section)  Acute Rehab PT Goals Patient Stated Goal: to get up OOB PT Goal Formulation: With patient/family Time For Goal Achievement: 06/20/21 Potential to Achieve Goals: Good    Frequency Min 3X/week     Co-evaluation               AM-PAC PT "6 Clicks" Mobility  Outcome Measure Help needed turning from your back to your side while in a flat bed without using bedrails?: A Little Help needed moving from lying on your back to sitting on the side of a flat bed without using bedrails?: A Little Help needed moving to and from a bed to a chair (including a wheelchair)?: A Little Help needed standing up from a chair using your arms (e.g., wheelchair or bedside chair)?: A Little Help needed to walk in hospital  room?: A Little Help needed climbing 3-5 steps with a railing? : A Little 6 Click Score: 18    End of Session Equipment Utilized During Treatment: Oxygen Activity Tolerance: Patient tolerated treatment well Patient left: in chair;with call bell/phone within reach;with nursing/sitter in room;with family/visitor present Nurse Communication: Mobility status PT Visit Diagnosis: Unsteadiness on feet (R26.81);Other abnormalities of gait and mobility (R26.89);Difficulty in walking, not elsewhere classified (R26.2)    Time: 3014-8403 PT Time Calculation (min) (ACUTE ONLY): 26  min   Charges:   PT Evaluation $PT Eval Moderate Complexity: 1 Mod PT Treatments $Gait Training: 8-22 mins        Moishe Spice, PT, DPT Acute Rehabilitation Services  Pager: [phone] Office: [phone]   Orvan Falconer 06/06/2021, 2:42 PM

## 2021-06-07 DIAGNOSIS — I5022 Chronic systolic (congestive) heart failure: Secondary | ICD-10-CM | POA: Diagnosis not present

## 2021-06-07 DIAGNOSIS — Z95811 Presence of heart assist device: Secondary | ICD-10-CM | POA: Diagnosis not present

## 2021-06-07 DIAGNOSIS — I629 Nontraumatic intracranial hemorrhage, unspecified: Secondary | ICD-10-CM | POA: Diagnosis not present

## 2021-06-07 LAB — CBC
HCT: 47.5 % (ref 39.0–52.0)
Hemoglobin: 15.4 g/dL (ref 13.0–17.0)
MCH: 26.8 pg (ref 26.0–34.0)
MCHC: 32.4 g/dL (ref 30.0–36.0)
MCV: 82.8 fL (ref 80.0–100.0)
Platelets: 185 10*3/uL (ref 150–400)
RBC: 5.74 MIL/uL (ref 4.22–5.81)
RDW: 15 % (ref 11.5–15.5)
WBC: 14.8 10*3/uL — ABNORMAL HIGH (ref 4.0–10.5)
nRBC: 0 % (ref 0.0–0.2)

## 2021-06-07 LAB — BASIC METABOLIC PANEL
Anion gap: 9 (ref 5–15)
BUN: 20 mg/dL (ref 6–20)
CO2: 23 mmol/L (ref 22–32)
Calcium: 9.2 mg/dL (ref 8.9–10.3)
Chloride: 105 mmol/L (ref 98–111)
Creatinine, Ser: 1.57 mg/dL — ABNORMAL HIGH (ref 0.61–1.24)
GFR, Estimated: 51 mL/min — ABNORMAL LOW (ref >60)
Glucose, Bld: 111 mg/dL — ABNORMAL HIGH (ref 70–99)
Potassium: 4.1 mmol/L (ref 3.5–5.1)
Sodium: 137 mmol/L (ref 135–145)

## 2021-06-07 LAB — PROTIME-INR
INR: 1 (ref 0.8–1.2)
Prothrombin Time: 12.9 seconds (ref 11.4–15.2)

## 2021-06-07 LAB — LACTATE DEHYDROGENASE: LDH: 166 U/L (ref 98–192)

## 2021-06-07 MED ORDER — GABAPENTIN 300 MG PO CAPS
300.0000 mg | ORAL_CAPSULE | Freq: Two times a day (BID) | ORAL | Status: DC
  Administered 2021-06-07 – 2021-06-28 (×39): 300 mg via ORAL
  Filled 2021-06-07 (×40): qty 1

## 2021-06-07 MED ORDER — CHLORHEXIDINE GLUCONATE CLOTH 2 % EX PADS
6.0000 | MEDICATED_PAD | Freq: Every day | CUTANEOUS | Status: DC
  Administered 2021-06-08 – 2021-06-22 (×13): 6 via TOPICAL

## 2021-06-07 MED ORDER — CEFAZOLIN SODIUM-DEXTROSE 1-4 GM/50ML-% IV SOLN
1.0000 g | Freq: Three times a day (TID) | INTRAVENOUS | Status: DC
  Administered 2021-06-07 – 2021-06-22 (×45): 1 g via INTRAVENOUS
  Filled 2021-06-07 (×52): qty 50

## 2021-06-07 MED ORDER — LOSARTAN POTASSIUM 25 MG PO TABS
25.0000 mg | ORAL_TABLET | Freq: Every day | ORAL | Status: DC
  Administered 2021-06-07 – 2021-06-08 (×2): 25 mg via ORAL
  Filled 2021-06-07 (×2): qty 1

## 2021-06-07 MED ORDER — TRAZODONE HCL 50 MG PO TABS
50.0000 mg | ORAL_TABLET | Freq: Every day | ORAL | Status: DC
  Administered 2021-06-07 – 2021-06-27 (×18): 50 mg via ORAL
  Filled 2021-06-07 (×19): qty 1

## 2021-06-07 NOTE — TOC CM/SW Note (Signed)
.. °  Transition of Care [facility]) Screening Note   Patient Details  Name: WILLY PINKERTON Date of Birth: [birth year]   Transition of Care [facility]) CM/SW Contact:    Erenest Rasher, RN Phone Number: 06/07/2021, 3:34 PM    Transition of Care Department [facility]) has reviewed patient . We will continue to monitor patient advancement through interdisciplinary progression rounds. No PT/OT recommended for home. Outpt PT recommended. [place], Heart Failure TOC CM [phone]

## 2021-06-07 NOTE — Progress Notes (Signed)
Subjective: The patient is alert and pleasant.  He is in no apparent distress.  He admits to a mild headache.  Objective: Vital signs in last 24 hours: Temp:  [98.6 F (37 C)-98.9 F (37.2 C)] 98.8 F (37.1 C) (02/13 0400) Pulse Rate:  [76-163] 94 (02/13 0700) Resp:  [10-26] 22 (02/13 0700) BP: (80-116)/(58-99) 110/94 (02/13 0700) SpO2:  [89 %-97 %] 93 % (02/13 0700) Estimated body mass index is 30.34 kg/m as calculated from the following:   Height as of this encounter: 5\' 5"  (1.651 m).   Weight as of this encounter: 82.7 kg.   Intake/Output from previous day: 02/12 0701 - 02/13 0700 In: 480 [P.O.:480] Out: 528 [Urine:400; Drains:128] Intake/Output this shift: No intake/output data recorded.  Physical exam the patient is alert and oriented x3.  He is moving all 4 extremities well.  His ventriculostomy is patent and draining well.  Lab Results: Recent Labs    06/06/21 0044 06/07/21 0056  WBC 12.1* 14.8*  HGB 15.1 15.4  HCT 48.3 47.5  PLT 179 185   BMET Recent Labs    06/06/21 0044 06/07/21 0056  NA 140 137  K 4.1 4.1  CL 108 105  CO2 21* 23  GLUCOSE 93 111*  BUN 14 20  CREATININE 1.41* 1.57*  CALCIUM 9.2 9.2    Studies/Results: CT HEAD WO CONTRAST (5MM)  Result Date: 06/06/2021 CLINICAL DATA:  Mental status changes. Follow-up intracranial hemorrhage. EXAM: CT HEAD WITHOUT CONTRAST TECHNIQUE: Contiguous axial images were obtained from the base of the skull through the vertex without intravenous contrast. RADIATION DOSE REDUCTION: This exam was performed according to the departmental dose-optimization program which includes automated exposure control, adjustment of the mA and/or kV according to patient size and/or use of iterative reconstruction technique. COMPARISON:  Earlier same day FINDINGS: Brain: Interval placement of a ventriculostomy from a right frontal approach, entering the posterior body of the left lateral ventricle. The tip appears to be just  within the ventricle. The ventriculostomy is obviously functioning as there is resolution of the previously seen hydrocephalus. The intraparenchymal hematoma with the epicenter in the right caudothalamic groove region measures 3.5 x 3.0 x 3.0 cm, few mm larger than on the initial exam. Small amount of intraventricular blood dependent in the occipital horn of the right lateral ventricle has not increased. There is a small amount of blood in the fourth ventricle as seen previously. No new area of bleeding is seen. Vascular: No abnormal vascular finding. Skull: Otherwise negative. Sinuses/Orbits: Clear/normal Other: None IMPRESSION: Placement of a ventriculostomy from a right frontal approach, with good effect and resolution of previously seen hydrocephalus. Slight increase in size of the intraparenchymal hematoma at the right caudothalamic groove, now measuring 3.5 x 3.0 x 3.0 cm, a couple of mm larger than on the initial study. No increase in the amount of intraventricular blood. Electronically Signed   By: Nelson Chimes M.D.   On: 06/06/2021 00:00    Assessment/Plan: Right caudate hemorrhage, interventricular hemorrhage, obstructive hydrocephalus: The patient is doing well with a ventriculostomy.  We will plan to continue it a few more days to allow some of his intraventricular blood to resolve and hopefully his hydrocephalus to resolve.  I started him on Keflex because of the ventriculostomy.  I think is okay to start heparin at this point if cardiology feels it is necessary.  LOS: 2 days     David [name] 06/07/2021, 7:43 AM     Patient ID: David [name],  male   DOB: 1965/11/26, 56 y.o.   MRN: [id number]

## 2021-06-07 NOTE — Progress Notes (Addendum)
Advanced Heart Failure VAD Team Note  PCP-Cardiologist: Glori Bickers, MD   Subjective:    2/11 Presented with [place] 2/11 Underwent bedside ventriculostomy wit NSU 2/12 Repeat head CT with resolution of hydrocephalus. Persistent hematoma  Alert and oriented today. Still w/ mild headache.  MAPs elevated 90s-low 100s, requiring PRN IV hydralazine   INR 1.0. Hgb stable 15.4. LDH 166   Denies dyspnea. No Chest pain.   LVAD INTERROGATION:  HeartMate 3 LVAD:   Flow 4.7 liters/min, speed 5450, power 4.2, PI 3.4. 3 PI events   Objective:    Vital Signs:   Temp:  [98 F (36.7 C)-98.8 F (37.1 C)] 98 F (36.7 C) (02/13 0759) Pulse Rate:  [76-163] 89 (02/13 0900) Resp:  [10-26] 24 (02/13 0900) BP: (80-116)/(61-99) 106/66 (02/13 0900) SpO2:  [89 %-97 %] 91 % (02/13 0900)   Mean arterial Pressure 90s-low 100s  Intake/Output:   Intake/Output Summary (Last 24 hours) at 06/07/2021 0945 Last data filed at 06/07/2021 0900 Gross per 24 hour  Intake 480 ml  Output 522 ml  Net -42 ml     Physical Exam    General:  sitting up in bed. No distress HEENT: normal + dressing Neck: supple. No JVP . Carotids 2+ bilat; no bruits. No lymphadenopathy or thyromegaly appreciated. Cor: Mechanical heart sounds with LVAD hum present. Lungs: CTAB, no wheezing  Abdomen: soft, nontender, nondistended. No hepatosplenomegaly. No bruits or masses. Good bowel sounds. Driveline: C/D/I; securement device intact and driveline incorporated Extremities: no cyanosis, clubbing, rash, edema Neuro: alert & orientedx3, cranial nerves grossly intact. moves all 4 extremities w/o difficulty. Affect pleasant   Telemetry   NSR 80-90s Personally reviewed   Labs   Basic Metabolic Panel: Recent Labs  Lab 06/05/21 0636 06/06/21 0044 06/07/21 0056  NA 137 140 137  K 3.9 4.1 4.1  CL 105 108 105  CO2 21* 21* 23  GLUCOSE 150* 93 111*  BUN 16 14 20   CREATININE 1.64* 1.41* 1.57*  CALCIUM 9.3 9.2 9.2     Liver Function Tests: Recent Labs  Lab 06/05/21 0636  AST 29  ALT 29  ALKPHOS 114  BILITOT 0.7  PROT 7.6  ALBUMIN 3.3*   No results for input(s): LIPASE, AMYLASE in the last 168 hours. Recent Labs  Lab 06/05/21 0646  AMMONIA 30    CBC: Recent Labs  Lab 06/05/21 0636 06/06/21 0044 06/07/21 0056  WBC 11.4* 12.1* 14.8*  NEUTROABS 10.1*  --   --   HGB 16.0 15.1 15.4  HCT 49.5 48.3 47.5  MCV 83.1 83.1 82.8  PLT 185 179 185    INR: Recent Labs  Lab 06/05/21 0636 06/05/21 1220 06/06/21 0044 06/07/21 0056  INR 2.4* 1.1 1.0 1.0    Other results:    Imaging   CT HEAD WO CONTRAST (5MM)  Result Date: 06/06/2021 CLINICAL DATA:  Mental status changes. Follow-up intracranial hemorrhage. EXAM: CT HEAD WITHOUT CONTRAST TECHNIQUE: Contiguous axial images were obtained from the base of the skull through the vertex without intravenous contrast. RADIATION DOSE REDUCTION: This exam was performed according to the departmental dose-optimization program which includes automated exposure control, adjustment of the mA and/or kV according to patient size and/or use of iterative reconstruction technique. COMPARISON:  Earlier same day FINDINGS: Brain: Interval placement of a ventriculostomy from a right frontal approach, entering the posterior body of the left lateral ventricle. The tip appears to be just within the ventricle. The ventriculostomy is obviously functioning as there is resolution of the  previously seen hydrocephalus. The intraparenchymal hematoma with the epicenter in the right caudothalamic groove region measures 3.5 x 3.0 x 3.0 cm, few mm larger than on the initial exam. Small amount of intraventricular blood dependent in the occipital horn of the right lateral ventricle has not increased. There is a small amount of blood in the fourth ventricle as seen previously. No new area of bleeding is seen. Vascular: No abnormal vascular finding. Skull: Otherwise negative.  Sinuses/Orbits: Clear/normal Other: None IMPRESSION: Placement of a ventriculostomy from a right frontal approach, with good effect and resolution of previously seen hydrocephalus. Slight increase in size of the intraparenchymal hematoma at the right caudothalamic groove, now measuring 3.5 x 3.0 x 3.0 cm, a couple of mm larger than on the initial study. No increase in the amount of intraventricular blood. Electronically Signed   By: Nelson Chimes M.D.   On: 06/06/2021 00:00     Medications:     Scheduled Medications:  amiodarone  200 mg Oral Daily   atorvastatin  40 mg Oral Daily   Chlorhexidine Gluconate Cloth  6 each Topical Daily   hydrALAZINE  25 mg Oral Q8H   magnesium oxide  400 mg Oral BID   pantoprazole  40 mg Oral Daily    Infusions:   ceFAZolin (ANCEF) IV 1 g (06/07/21 0909)    PRN Medications: acetaminophen, hydrALAZINE, ondansetron (ZOFRAN) IV, traMADol    Assessment/Plan:    1. Intracranial hemorrhage - seen by NSU - CT brain with intracerebral and intraventricular hemorrhage with obstructive hydrocephalus. INR 2.4 - AC reversed with Kcentra/vit k in ER - Underwent bedside ventriculostomy on 2/11 - Repeat head CT with resolution of hydrocephalus. Persistent hematoma - Appreciate NSU input - INR 1.0 today - With HM-3 device ok to continue to hold [facility] for at least a few more days (or longer) without too much risk   2. Chronic systolic CHF with HM III LVAD implant 04/20/17:  - Has done well with VAD support. - NYHA I  - Volume status looks good - He has been seen at [facility] for transplant referral but has decided not to pursue VAD. No change   3. Paroxysmal AFL with RVR - had DC-CV on 10/17/18 with subsequent short episodes of AF after but has been mostly in NSR on low-dose amio (100 daily) - Had recurrent AF in 10/22. Amio increased to 200 bid and converted.  - Now in NSR. Continue amio 200 daily - No change   4. h/o UGI AVM bleed - s/p APC x 4 lesions in 2/19 -  No recurrent bleeding  - Continue PPI - Off ASA due to GIB.  - Hgb stable at 15.4   5. CKD Stage IIIa:  - Creatinine baseline 1.3 - 1.7  - Creatinine 1.57 today - Continue to follow - Consider SGLT2i   5. VAD management - VAD interrogated personally. Parameters stable. - LDH has been stable  - INR goal 2.0-2.5 Off ASA due to GIB 2/19.  - INR 2.4  -> now reversed due to [place]. INR 1.0 - MAPs high. Restart Losartan 25 mg  - DL site looks good   6. CAD:  s/p PCI RCA/PLOM with DES x2 and DES to mild LAD 1/18 - Nos/s angina - Continue statin. Off ASA due to GIB   7. Hypomagnesemia. -  Continue mag supplements   Length of Stay: 2  Lyda Jester, PA-C 06/07/2021, 9:45 AM  VAD Team --- VAD ISSUES ONLY--- Pager [phone] (7am - 7am)  Advanced Heart Failure Team  Pager [phone] (M-F; 7a - 5p)  Please contact [facility] for night-coverage after hours (5p -7a ) and weekends on [email or web address]  Agree with above.   Ventriculostomy drain remains in place with mild drainage. HA improved. MAPs still mildly elevated. Getting PRN hydralazine. Volume status stable. VAD interrogated personally. Parameters stable.  General:  NAD.  HEENT: normal  + dressing Neck: supple. JVP not elevated.  Carotids 2+ bilat; no bruits. No lymphadenopathy or thryomegaly appreciated. Cor: LVAD hum.  Lungs: Clear. Abdomen: obese soft, nontender, non-distended. No hepatosplenomegaly. No bruits or masses. Good bowel sounds. Driveline site clean. Anchor in place.  Extremities: no cyanosis, clubbing, rash. Warm no edema  Neuro: alert & oriented x 3. No focal deficits. Moves all 4 without problem   More alert. HA improving. Appreciate NSU help. Will keep drain in for now. Will leave off [facility] for now. Continue to titrate anti-HTN meds. VAD interrogated personally. Parameters stable.  Glori Bickers, MD  12:25 PM

## 2021-06-07 NOTE — Plan of Care (Signed)

## 2021-06-07 NOTE — Progress Notes (Signed)
LVAD Coordinator Rounding Note:  Admitted 06/05/21 due to [place] with hydrocephalus to Dr Bensimhon's service. Ventriculostomy placed at bedside by Dr Arnoldo Morale.   HM III LVAD implanted on 04/20/17 by Dr Cyndia Bent under destination therapy criteria.  Pt sitting up in bed this morning watching TV. He is alert and oriented x 3. At times his responses are delayed, which is not his baseline. Reports he had a headache earlier this morning, but denies pain currently. Did not eat breakfast this morning stating he has no appetite.   Map goal 70s-80s per neurosurgery recommendations. Requiring PRN IV Hydralazine.   Ventriculostomy drain in place; draining well (roughly 150 cc in drainage bag since placement.) Currently on Ancef 1 gram IV every 8 hours with drain in place per neurosurgery. WBC 14.8 today. Plan to continue drain for a few more days per neurosurgery.   Repeat head CT 06/05/21:  Placement of a ventriculostomy from a right frontal approach, with good effect and resolution of previously seen hydrocephalus. Slight increase in size of the intraparenchymal hematoma at the right caudothalamic groove, now measuring 3.5 x 3.0 x 3.0 cm, a couple of mm larger than on the initial study. No increase in the amount of intraventricular blood.   INR 1.0 today. Not on anticoagulation for now. Timing of restart per Dr Haroldine Laws and neurosurgery.   Provided pt with battery holster per PT request.   Vital signs: Temp: 98.0 HR: 92 Doppler Pressure:120 Automatic BP: 107/87 (96) O2 Sat: 98% on RA Wt:182.3 lbs    LVAD interrogation reveals:  Speed: 5400 Flow: 4.8 Power:  4.3 PI: 4.1   Alarms: none Events:  4 PI events today Hematocrit:   Fixed speed: 5400 Low speed limit: 5100  Drive Line: Exit site maintained weekly by patient's wife using weekly kit. Existing VAD dressing removed and site care performed using sterile technique. Drive line exit site cleaned with Chlora prep applicators x 2, allowed to  dry, RINSED WITH SALINE, allowed to dry, and [place]aview dressing with gauze pad applied. Exit site healed and incorporated, the velour is fully implanted at exit site. Large dried scab present at site that came off with cleansing. No redness, tenderness, drainage, foul odor or rash noted. Drive line anchor re-applied. Weekly dressing changes per bedside RN or pt's wife. Next dressing change due: 06/14/21.  Labs:  LDH trend: 166  INR trend: 1.0  WBC: 14.8  Anticoagulation Plan: -INR Goal: 2 - 2.5  (anticoagulation on hold currently due to [place]) -ASA Dose: none  Device: - [employer]tronic BiV -Therapies: on- VF 200, VT 167  AT 171 monitored - Last check: 04/29/21  Arrythmias: Currently NSR (history of afib/flutter). Taking Amiodarone 200 mg daily.   Adverse Events on VAD: - Readmitted 2/19 hgb 3.8. INR 2.9.transfused 4u pRBCs. 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 Call VAD coordinator for any VAD equipment or drive line issues 2. Weekly drive line dressing change per bedside RN or pt's wife  Emerson Monte RN [place] Coordinator  Office: [phone]  24/7 Pager: [phone]

## 2021-06-07 NOTE — Evaluation (Signed)
Occupational Therapy Evaluation Patient Details Name: David [name] MRN: [id number] DOB: 1965/06/14 Today's Date: 06/07/2021   History of Present Illness 56 y.o. male who presented 06/05/21 with worsening HA. CT showed right basal ganglia hemorrhage, intraventricular hemorrhage and obstructive hydrocephalus. S/p placement of right frontal ventriculostomy via bur hole 2/11. 2/12 Repeat head CT with resolution of hydrocephalus. 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 Pt was performing BADLs independent and did not use any DME. Pt currently presenting with decreased cognition as seen by decreased problem solving, memory, sequencing, and awareness. Pt requiring Mod hand over hand and Max A for managing LVAD batteries in preparation for activity. Pt requiring Min A +2 for functional mobility in hallway. Pt would benefit from further acute OT to facilitate safe dc. Recommend dc to home with follow up at OP neuro for further OT to optimize safety, independence with ADLs, and return to PLOF.      Recommendations for follow up therapy are one component of a multi-disciplinary discharge planning process, led by the attending physician.  Recommendations may be updated based on patient status, additional functional criteria and insurance authorization.   Follow Up Recommendations  Outpatient OT (Neuro)    Assistance Recommended at Discharge Frequent or constant Supervision/Assistance  Patient can return home with the following Direct supervision/assist for medications management;Direct supervision/assist for financial management;A little help with walking and/or transfers;A little help with bathing/dressing/bathroom    Functional Status Assessment  Patient has had a recent decline in their functional status and demonstrates the ability to  make significant improvements in function in a reasonable and predictable amount of time.  Equipment Recommendations  None recommended by OT    Recommendations for Other Services PT consult;Speech consult     Precautions / Restrictions Precautions Precautions: Fall;Other (comment) Precaution Comments: LVAD (x4 year hx), but no vest as pt puts batteries in back pocket; EVD      Mobility Bed Mobility Overal bed mobility: Needs Assistance Bed Mobility: Supine to Sit     Supine to sit: HOB elevated, Mod assist, +2 for physical assistance     General bed mobility comments: Min A for elevating trunk and bringing hips towards EOB    Transfers Overall transfer level: Needs assistance Equipment used: None Transfers: Sit to/from Stand Sit to Stand: Min assist, +2 safety/equipment           General transfer comment: Min A for power up and gaining balance      Balance Overall balance assessment: Mild deficits observed, not formally tested                                         ADL either performed or assessed with clinical judgement   ADL Overall ADL's : Needs assistance/impaired Eating/Feeding: Set up;Sitting   Grooming: Minimal assistance;Sitting;Cueing for sequencing   Upper Body Bathing: Minimal assistance;Sitting   Lower Body Bathing: Moderate assistance;Sit to/from stand Lower Body Bathing Details (indicate cue type and reason): While sitting at EOB, requesting pt perform peri care. pt instantly reaches wash clothe towards his wife to clean him. Cueing pt to perform and requiring increased encouragment. Pt with difficulty terminating task and requiring cues to stop Upper Body Dressing : Moderate assistance;Sitting Upper Body Dressing  Details (indicate cue type and reason): When switching from wall power to LVAD batteries, pt with poor problem solving, error recognition, sequencing requiring Mod-Max cues cues Lower Body Dressing: Maximal  assistance;Sit to/from stand               Functional mobility during ADLs: Minimal assistance;+2 for physical assistance;+2 for safety/equipment General ADL Comments: Pt with decreased cognition, balance, and acitvity toelrance impacting his safe performance     Vision         Perception     Praxis      Pertinent Vitals/Pain Pain Assessment Pain Assessment: Faces Faces Pain Scale: No hurt Pain Intervention(s): Monitored during session, Limited activity within patient's tolerance, Repositioned     Hand Dominance Right   Extremity/Trunk Assessment Upper Extremity Assessment Upper Extremity Assessment: Overall WFL for tasks assessed   Lower Extremity Assessment Lower Extremity Assessment: Defer to PT evaluation   Cervical / Trunk Assessment Cervical / Trunk Assessment: Normal   Communication Communication Communication: No difficulties   Cognition Arousal/Alertness: Awake/alert Behavior During Therapy: Flat affect Overall Cognitive Status: Impaired/Different from baseline Area of Impairment: Attention, Awareness, Problem solving                   Current Attention Level: Sustained Memory: Decreased short-term memory Following Commands: Follows one step commands with increased time, Follows one step commands inconsistently Safety/Judgement: Decreased awareness of safety, Decreased awareness of deficits Awareness: Emergent Problem Solving: Slow processing, Decreased initiation, Difficulty sequencing, Requires verbal cues General Comments: Pt with flat affect. Difficulty probelm solving and processing. During changing from wall to LVAD batteries, pt requiring max cues for sequencing and problem solving errors. Pt also presenting with decreased termination skills during ADLs; ie coninuing to perform peri care and needing cues to stop or pt holding LVAD monitor or call bell in L hand requiring cues to let go. Pt able to name 2/3 animals that start with a "C".  requiring max descriptive coes to think of third animal; pt initially repeating his first two picks.     General Comments  VSS. RN present and able to clamp EVD during mobility    Exercises     Shoulder Instructions      Home Living Family/patient expects to be discharged to:: Private residence Living Arrangements: Spouse/significant other Available Help at Discharge: Family;Available 24 hours/day Type of Home: Apartment Home Access: Level entry     Home Layout: One level     Bathroom Shower/Tub: Sponge bathes at baseline   Bathroom Toilet: Standard     Home Equipment: None          Prior Functioning/Environment Prior Level of Function : Independent/Modified Independent             Mobility Comments: Does not use an AD. Denies any falls. ADLs Comments: Wife changes LVAD dressing. Pt sponges at baseline. Pt manages his LVAD batteries. Pt management medication at baseline.        OT Problem List: Decreased strength;Decreased range of motion;Decreased activity tolerance;Impaired balance (sitting and/or standing);Decreased knowledge of use of DME or AE;Decreased knowledge of precautions      OT Treatment/Interventions: Self-care/ADL training;Therapeutic exercise;Energy conservation;DME and/or AE instruction;Therapeutic activities;Cognitive remediation/compensation;Patient/family education    OT Goals(Current goals can be found in the care plan section) Acute Rehab OT Goals Patient Stated Goal: Go home OT Goal Formulation: With patient/family Time For Goal Achievement: 06/21/21 Potential to Achieve Goals: Good  OT Frequency: Min 3X/week    Co-evaluation PT/OT/SLP Co-Evaluation/Treatment: Yes Reason  for Co-Treatment: For patient/therapist safety;To address functional/ADL transfers   OT goals addressed during session: ADL's and self-care      AM-PAC OT "6 Clicks" Daily Activity     Outcome Measure Help from another person eating meals?: A Little Help from  another person taking care of personal grooming?: A Little Help from another person toileting, which includes using toliet, bedpan, or urinal?: A Little Help from another person bathing (including washing, rinsing, drying)?: A Lot Help from another person to put on and taking off regular upper body clothing?: A Lot Help from another person to put on and taking off regular lower body clothing?: A Lot 6 Click Score: 15   End of Session Equipment Utilized During Treatment: Gait belt Nurse Communication: Mobility status  Activity Tolerance: Patient tolerated treatment well Patient left: in chair;with call bell/phone within reach;with nursing/sitter in room  OT Visit Diagnosis: Unsteadiness on feet (R26.81);Other abnormalities of gait and mobility (R26.89);Muscle weakness (generalized) (M62.81)                Time: 0962-8366 OT Time Calculation (min): 30 min Charges:  OT General Charges $OT Visit: 1 Visit OT Evaluation $OT Eval Moderate Complexity: [place], OTR/L Acute Rehab Pager: [phone] Office: [place] 06/07/2021, 1:17 PM

## 2021-06-07 NOTE — Progress Notes (Signed)
Physical Therapy Treatment Patient Details Name: David [name] MRN: [id number] DOB: 1966/04/04 Today's Date: 06/07/2021   History of Present Illness 56 y.o. male who presented 06/05/21 with worsening HA. CT showed right basal ganglia hemorrhage, intraventricular hemorrhage and obstructive hydrocephalus. S/p placement of right frontal ventriculostomy via bur hole 2/11. 2/12 Repeat head CT with resolution of hydrocephalus. PMH: ICD, HfrEF, HLD, HTN, s/p LVAD HM-III 04/20/18, CAD, CKD, MI, seizures    PT Comments    Pt progressing well towards his physical therapy goals, with increased ambulation distance this session. Pt requiring light min assist for transfers and ambulating 250 feet with no assistive device at a min guard assist level (+2-3 assist for line management only with EVD, LVAD batteries, and IV). Pt demonstrates decreased cognition in comparison to baseline. He demonstrates difficulty in the areas of problem solving, memory, attention and awareness. Pt requiring assist from changing to wall to LVAD batteries, which is new. Will likely need OT follow up to address.Will continue to follow acutely to promote mobility while inpatient.   Recommendations for follow up therapy are one component of a multi-disciplinary discharge planning process, led by the attending physician.  Recommendations may be updated based on patient status, additional functional criteria and insurance authorization.  Follow Up Recommendations  No PT follow up     Assistance Recommended at Discharge Frequent or constant Supervision/Assistance  Patient can return home with the following A little help with bathing/dressing/bathroom;Assistance with cooking/housework;Direct supervision/assist for financial management;Direct supervision/assist for medications management;Assist for transportation   Equipment Recommendations  None recommended by PT    Recommendations for Other Services       Precautions /  Restrictions Precautions Precautions: Fall;Other (comment) Precaution Comments: LVAD (x4 year hx), EVD (clamp prior to mobilizing) Restrictions Weight Bearing Restrictions: No     Mobility  Bed Mobility Overal bed mobility: Needs Assistance Bed Mobility: Supine to Sit     Supine to sit: HOB elevated, Mod assist, +2 for physical assistance     General bed mobility comments: Min A for elevating trunk and bringing hips towards EOB    Transfers Overall transfer level: Needs assistance Equipment used: None Transfers: Sit to/from Stand Sit to Stand: Min assist, +2 safety/equipment           General transfer comment: Min A for power up and gaining balance    Ambulation/Gait Ambulation/Gait assistance: Min guard, +2 safety/equipment Gait Distance (Feet): 250 Feet Assistive device: None Gait Pattern/deviations: Step-through pattern, Decreased stride length Gait velocity: decreased     General Gait Details: Slow but steady pace, min guard for safety (+2-3 for line management)   Stairs             Wheelchair Mobility    Modified Rankin (Stroke Patients Only) Modified Rankin (Stroke Patients Only) Pre-Morbid Rankin Score: No symptoms Modified Rankin: Moderately severe disability     Balance Overall balance assessment: Mild deficits observed, not formally tested                                          Cognition Arousal/Alertness: Awake/alert Behavior During Therapy: Flat affect Overall Cognitive Status: Impaired/Different from baseline Area of Impairment: Attention, Awareness, Problem solving                   Current Attention Level: Sustained       Awareness: Emergent Problem Solving: Slow processing, Decreased  initiation, Difficulty sequencing, Requires verbal cues General Comments: Pt with flat affect. Difficulty probelm solving and processing. During changing from wall to LVAD batteries, pt requiring max cues for  sequencing and problem solving errors. Pt also presenting with decreased termination skills during ADLs; ie coninuing to perform peri care and needing cues to stop or pt holding LVAD monitor or call bell in L hand requiring cues to let go. Pt able to name 2/3 animals that start with a "C". requiring max descriptive coes to think of third animal; pt initially repeating his first two picks.        Exercises      General Comments General comments (skin integrity, edema, etc.): 91% on RA, HR 91, BP 97/73 (82)      Pertinent Vitals/Pain Pain Assessment Pain Assessment: Faces Faces Pain Scale: No hurt    Home Living Family/patient expects to be discharged to:: Private residence Living Arrangements: Spouse/significant other Available Help at Discharge: Family;Available 24 hours/day Type of Home: Apartment Home Access: Level entry       Home Layout: One level Home Equipment: None      Prior Function            PT Goals (current goals can now be found in the care plan section) Acute Rehab PT Goals Patient Stated Goal: pt family would like cognition to improve Potential to Achieve Goals: Good Progress towards PT goals: Progressing toward goals    Frequency    Min 3X/week      PT Plan Current plan remains appropriate    Co-evaluation PT/OT/SLP Co-Evaluation/Treatment: Yes Reason for Co-Treatment: For patient/therapist safety;To address functional/ADL transfers;Complexity of the patient's impairments (multi-system involvement) PT goals addressed during session: Mobility/safety with mobility OT goals addressed during session: ADL's and self-care      AM-PAC PT "6 Clicks" Mobility   Outcome Measure  Help needed turning from your back to your side while in a flat bed without using bedrails?: A Little Help needed moving from lying on your back to sitting on the side of a flat bed without using bedrails?: A Little Help needed moving to and from a bed to a chair (including  a wheelchair)?: A Little Help needed standing up from a chair using your arms (e.g., wheelchair or bedside chair)?: A Little Help needed to walk in hospital room?: A Little Help needed climbing 3-5 steps with a railing? : A Little 6 Click Score: 18    End of Session Equipment Utilized During Treatment: Gait belt Activity Tolerance: Patient tolerated treatment well Patient left: in chair;with call bell/phone within reach;with nursing/sitter in room Nurse Communication: Mobility status PT Visit Diagnosis: Unsteadiness on feet (R26.81);Other abnormalities of gait and mobility (R26.89);Difficulty in walking, not elsewhere classified (R26.2)     Time: 8937-3428 PT Time Calculation (min) (ACUTE ONLY): 26 min  Charges:  $Therapeutic Activity: 8-22 mins                     Wyona Almas, PT, DPT Acute Rehabilitation Services Pager [phone] Office [phone]    Deno Etienne 06/07/2021, 2:14 PM

## 2021-06-08 DIAGNOSIS — I629 Nontraumatic intracranial hemorrhage, unspecified: Secondary | ICD-10-CM | POA: Diagnosis not present

## 2021-06-08 DIAGNOSIS — Z95811 Presence of heart assist device: Secondary | ICD-10-CM | POA: Diagnosis not present

## 2021-06-08 DIAGNOSIS — I5022 Chronic systolic (congestive) heart failure: Secondary | ICD-10-CM | POA: Diagnosis not present

## 2021-06-08 LAB — TYPE AND SCREEN
ABO/RH(D): O POS
Antibody Screen: NEGATIVE
Donor AG Type: NEGATIVE
Donor AG Type: NEGATIVE
Unit division: 0
Unit division: 0

## 2021-06-08 LAB — BPAM RBC
Blood Product Expiration Date: 202302222359
Blood Product Expiration Date: 202303012359
ISSUE DATE / TIME: 202301292123
Unit Type and Rh: 5100
Unit Type and Rh: 5100

## 2021-06-08 LAB — BASIC METABOLIC PANEL
Anion gap: 10 (ref 5–15)
BUN: 19 mg/dL (ref 6–20)
CO2: 25 mmol/L (ref 22–32)
Calcium: 9.1 mg/dL (ref 8.9–10.3)
Chloride: 101 mmol/L (ref 98–111)
Creatinine, Ser: 1.71 mg/dL — ABNORMAL HIGH (ref 0.61–1.24)
GFR, Estimated: 46 mL/min — ABNORMAL LOW (ref >60)
Glucose, Bld: 96 mg/dL (ref 70–99)
Potassium: 4 mmol/L (ref 3.5–5.1)
Sodium: 136 mmol/L (ref 135–145)

## 2021-06-08 LAB — PROTIME-INR
INR: 1.2 (ref 0.8–1.2)
Prothrombin Time: 14.8 seconds (ref 11.4–15.2)

## 2021-06-08 LAB — CBC
HCT: 46.2 % (ref 39.0–52.0)
Hemoglobin: 14.7 g/dL (ref 13.0–17.0)
MCH: 26.5 pg (ref 26.0–34.0)
MCHC: 31.8 g/dL (ref 30.0–36.0)
MCV: 83.4 fL (ref 80.0–100.0)
Platelets: 169 10*3/uL (ref 150–400)
RBC: 5.54 MIL/uL (ref 4.22–5.81)
RDW: 15 % (ref 11.5–15.5)
WBC: 10.9 10*3/uL — ABNORMAL HIGH (ref 4.0–10.5)
nRBC: 0 % (ref 0.0–0.2)

## 2021-06-08 LAB — LACTATE DEHYDROGENASE: LDH: 176 U/L (ref 98–192)

## 2021-06-08 MED ORDER — POLYETHYLENE GLYCOL 3350 17 G PO PACK
17.0000 g | PACK | Freq: Every day | ORAL | Status: DC | PRN
  Administered 2021-06-08: 15:00:00 17 g via ORAL
  Filled 2021-06-08 (×2): qty 1

## 2021-06-08 MED ORDER — LOSARTAN POTASSIUM 25 MG PO TABS
25.0000 mg | ORAL_TABLET | Freq: Once | ORAL | Status: AC
  Administered 2021-06-08: 25 mg via ORAL
  Filled 2021-06-08: qty 1

## 2021-06-08 MED ORDER — ORAL CARE MOUTH RINSE
15.0000 mL | Freq: Two times a day (BID) | OROMUCOSAL | Status: DC
  Administered 2021-06-08 – 2021-06-28 (×30): 15 mL via OROMUCOSAL

## 2021-06-08 MED ORDER — LOSARTAN POTASSIUM 50 MG PO TABS
50.0000 mg | ORAL_TABLET | Freq: Every day | ORAL | Status: DC
  Administered 2021-06-09 – 2021-06-11 (×3): 50 mg via ORAL
  Filled 2021-06-08 (×3): qty 1

## 2021-06-08 MED ORDER — TRAMADOL HCL 50 MG PO TABS
50.0000 mg | ORAL_TABLET | Freq: Four times a day (QID) | ORAL | Status: DC | PRN
  Administered 2021-06-09 – 2021-06-23 (×26): 50 mg via ORAL
  Filled 2021-06-08 (×27): qty 1

## 2021-06-08 MED ORDER — OXYCODONE HCL 5 MG PO TABS
5.0000 mg | ORAL_TABLET | Freq: Four times a day (QID) | ORAL | Status: DC | PRN
  Administered 2021-06-08 – 2021-06-11 (×8): 5 mg via ORAL
  Administered 2021-06-17 – 2021-06-24 (×12): 10 mg via ORAL
  Filled 2021-06-08 (×2): qty 1
  Filled 2021-06-08 (×3): qty 2
  Filled 2021-06-08 (×2): qty 1
  Filled 2021-06-08 (×2): qty 2
  Filled 2021-06-08: qty 1
  Filled 2021-06-08 (×4): qty 2
  Filled 2021-06-08 (×2): qty 1
  Filled 2021-06-08: qty 2
  Filled 2021-06-08: qty 1
  Filled 2021-06-08 (×2): qty 2
  Filled 2021-06-08: qty 1
  Filled 2021-06-08: qty 2

## 2021-06-08 MED ORDER — SODIUM CHLORIDE 0.9 % IV BOLUS
250.0000 mL | Freq: Once | INTRAVENOUS | Status: AC
  Administered 2021-06-08: 250 mL via INTRAVENOUS

## 2021-06-08 NOTE — Progress Notes (Signed)
Subjective: The patient is alert and pleasant.  He still has a headache.  He is in no apparent distress.  Objective: Vital signs in last 24 hours: Temp:  [97.6 F (36.4 C)-98.9 F (37.2 C)] 98.3 F (36.8 C) (02/14 0500) Pulse Rate:  [30-116] 73 (02/14 0705) Resp:  [12-34] 19 (02/14 0705) BP: (80-110)/(56-98) 93/70 (02/14 0705) SpO2:  [87 %-97 %] 95 % (02/14 0705) Weight:  [84.4 kg] 84.4 kg (02/14 0500) Estimated body mass index is 30.96 kg/m as calculated from the following:   Height as of this encounter: 5\' 5"  (1.651 m).   Weight as of this encounter: 84.4 kg.   Intake/Output from previous day: 02/13 0701 - 02/14 0700 In: 1120 [P.O.:960; I.V.:10; IV Piggyback:150] Out: 580 [Urine:375; Drains:105; Stool:100] Intake/Output this shift: No intake/output data recorded.  Physical exam the patient is alert and oriented.  He is moving all 4 extremities.  His ventriculostomy is draining bloody spinal fluid.  Lab Results: Recent Labs    06/07/21 0056 06/08/21 0429  WBC 14.8* 10.9*  HGB 15.4 14.7  HCT 47.5 46.2  PLT 185 169   BMET Recent Labs    06/07/21 0056 06/08/21 0429  NA 137 136  K 4.1 4.0  CL 105 101  CO2 23 25  GLUCOSE 111* 96  BUN 20 19  CREATININE 1.57* 1.71*  CALCIUM 9.2 9.1    Studies/Results: No results found.  Assessment/Plan: Basal ganglia hemorrhage, intraventricular hemorrhage, obstructive hydrocephalus: The patient is doing well with his ventriculostomy.  The plan is to keep it draining until Thursday and then attempt to elevate it, and possibly remove it.  LOS: 3 days     [name] 06/08/2021, 7:53 AM     Patient ID: David [name], male   DOB: [birth year], 56 y.o.   MRN: [id number]

## 2021-06-08 NOTE — Progress Notes (Signed)
LVAD Coordinator Rounding Note:  Admitted 06/05/21 due to [place] with hydrocephalus to Dr Bensimhon's service. Ventriculostomy placed at bedside by Dr Arnoldo Morale.   HM III LVAD implanted on 04/20/17 by Dr Cyndia Bent under destination therapy criteria.  Pt sitting up in bed this morning watching TV. He is alert and oriented x 3. Pt does not remember who I am this morning or what I do. At times his responses are delayed, which is not his baseline.   Map goal 70s-80s per neurosurgery recommendations. Requiring PRN IV Hydralazine.   Ventriculostomy drain in place; draining well (roughly 150 cc in drainage bag since placement.) Currently on Ancef 1 gram IV every 8 hours with drain in place per neurosurgery. WBC 10.9 today. Plan to continue drain until Thursday per neurosurgery.    INR 1.2 today. Not on anticoagulation for now. Timing of restart per Dr Haroldine Laws and neurosurgery.   Vital signs: Temp: 98.2 HR: 76 Doppler Pressure: not done Automatic BP: 91/78 (84) O2 Sat: 95% on RA Wt:182.3>186 lbs    LVAD interrogation reveals:  Speed: 5400 Flow: 4.8 Power:  4.2 PI: 3.9   Alarms: none Events:  10+ PI events today Hematocrit: 46  Fixed speed: 5400 Low speed limit: 5100  Drive Line: CDI. Drive line anchor re-applied. Weekly dressing changes per bedside RN or pt's wife. Next dressing change due: 06/14/21.  Labs:  LDH trend: 166>176  INR trend: 1.0>1.2  WBC: 14.8>10.9  Anticoagulation Plan: -INR Goal: 2 - 2.5  (anticoagulation on hold currently due to [place]) -ASA Dose: none  Device: - Medtronic BiV -Therapies: on- VF 200, VT 167  AT 171 monitored - Last check: 04/29/21  Arrythmias: Currently NSR (history of afib/flutter). Taking Amiodarone 200 mg daily.   Adverse Events on VAD: - Readmitted 2/19 hgb 3.8. INR 2.9.transfused 4u pRBCs. 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 Call VAD coordinator for any VAD equipment or drive line issues 2. Weekly drive line dressing change per bedside RN or pt's wife  Tanda Rockers RN [place] Coordinator  Office: [phone]  24/7 Pager: [phone]

## 2021-06-08 NOTE — TOC Initial Note (Signed)
Transition of Care [facility]) - Initial/Assessment Note    Patient Details  Name: David [name] MRN: [id number] Date of Birth: June 06, 1965  [facility]) CM/SW Contact:    Erenest Rasher, RN Phone Number: [phone] 06/08/2021, 4:37 PM  Clinical Narrative:                 HF TOC CM spoke to pt, wife and dtr at bedside. Pt was independent prior to hospital stay. Wife does LVAD care at home. Offered choice for [facility] if [place] needed. Wife agreeable to [place]. If Outpt OT, she want facility in [place]. Will continue to follow for dc needs and DME.   Expected Discharge Plan: OP Rehab Barriers to Discharge: Continued Medical Work up   Patient Goals and CMS Choice Patient states their goals for this hospitalization and ongoing recovery are:: wife states she takes care of pt at home CMS Medicare.gov Compare Post Acute Care list provided to:: Patient Represenative (must comment) Choice offered to / list presented to : Spouse, Patient  Expected Discharge Plan and Services Expected Discharge Plan: OP Rehab   Discharge Planning Services: CM Consult   Living arrangements for the past 2 months: Single Family Home                                      Prior Living Arrangements/Services Living arrangements for the past 2 months: Single Family Home Lives with:: Spouse Patient language and need for interpreter reviewed:: Yes Do you feel safe going back to the place where you live?: Yes      Need for Family Participation in Patient Care: Yes (Comment) Care giver support system in place?: Yes (comment) Current home services: DME Criminal Activity/Legal Involvement Pertinent to Current Situation/Hospitalization: No - Comment as needed  Activities of Daily Living      Permission Sought/Granted Permission sought to share information with : Case Manager, Family Supports Permission granted to share information with : Yes, Verbal Permission Granted  Share Information with  NAME: Rayjon Wery  Permission granted to share info w AGENCY: Home Health, Outpt Occupational Therapy, DME  Permission granted to share info w Relationship: wife  Permission granted to share info w Contact Information: [phone]  Emotional Assessment Appearance:: Appears stated age Attitude/Demeanor/Rapport: Engaged Affect (typically observed): Accepting Orientation: : Oriented to Self, Oriented to Place, Oriented to  Time, Oriented to Situation   Psych Involvement: No (comment)  Admission diagnosis:  Intracranial hemorrhage ([place]) [I62.9] Intraparenchymal hemorrhage of brain ([facility]) [I61.9] ICH (intracerebral hemorrhage) ([place]) [I61.9] Patient Active Problem List   Diagnosis Date Noted   ICH (intracerebral hemorrhage) ([place]) 06/05/2021   Left ventricular assist device present ([place]) 06/08/2017   Gastric AVM    Presence of left ventricular assist device (LVAD) ([place]) 04/20/2017   Palliative care encounter    Ischemic cardiomyopathy 11/07/2016   CKD (chronic kidney disease), stage III ([place]) 05/13/2016   PAF (paroxysmal atrial fibrillation) ([place])    Chronic systolic heart failure ([place]) 03/15/2011   PCP:  Raina Mina., MD Pharmacy:   [facility] [address], [place] [address] [place] [address] Phone: [phone] Fax: [phone]  [facility] [phone] - [employer], [address] AT [place] [address] [place] [address] Phone: [phone] Fax: [place] [phone] -  [place], [place] [address] [place] [address] Phone: [phone] Fax: [place] [address] [place] [address] Phone: [phone] Fax: [phone]     Social Determinants of Health (SDOH) Interventions    Readmission Risk Interventions No flowsheet data found.

## 2021-06-08 NOTE — Progress Notes (Addendum)
Advanced Heart Failure VAD Team Note  PCP-Cardiologist: Glori Bickers, MD   Subjective:    2/11 Presented with [place] 2/11 Underwent bedside ventriculostomy wit NSU 2/12 Repeat head CT with resolution of hydrocephalus. Persistent hematoma  Continues w/ headache. No neuro deficits.   MAPs improving but still mildly elevated, 80s-90s.  Required IV hydralazine overnight.   INR 1.2. Hgb stable 14.7. LDH 176   Denies dyspnea. No Chest pain.   LVAD INTERROGATION:  HeartMate 3 LVAD:   Flow 4.8 liters/min, speed 5450, power 4.2, PI 4.5. numerous PI events (12)   Objective:    Vital Signs:   Temp:  [97.6 F (36.4 C)-98.9 F (37.2 C)] 98.2 F (36.8 C) (02/14 0705) Pulse Rate:  [30-116] 76 (02/14 0900) Resp:  [12-34] 23 (02/14 0900) BP: (80-110)/(56-98) 107/90 (02/14 0900) SpO2:  [87 %-97 %] 97 % (02/14 0900) Weight:  [84.4 kg] 84.4 kg (02/14 0500)   Mean arterial Pressure 80s-90s   Intake/Output:   Intake/Output Summary (Last 24 hours) at 06/08/2021 0943 Last data filed at 06/08/2021 0900 Gross per 24 hour  Intake 1120 ml  Output 488 ml  Net 632 ml     Physical Exam    General:  sitting up in bed. No distress.  HEENT: normal + dressing + ventriculostomy drain  Neck: supple. JVP not elevated. Carotids 2+ bilat; no bruits. No lymphadenopathy or thyromegaly appreciated. Cor: Mechanical heart sounds with LVAD hum present. Lungs: clear  Abdomen: soft, nontender, nondistended. No hepatosplenomegaly. No bruits or masses. Good bowel sounds. Driveline: C/D/I; securement device intact and driveline incorporated Extremities: no cyanosis, clubbing, rash, edema Neuro: alert & orientedx3, cranial nerves grossly intact. moves all 4 extremities w/o difficulty. Affect pleasant   Telemetry   NSR 80-90s Personally reviewed   Labs   Basic Metabolic Panel: Recent Labs  Lab 06/05/21 0636 06/06/21 0044 06/07/21 0056 06/08/21 0429  NA 137 140 137 136  K 3.9 4.1 4.1 4.0  CL  105 108 105 101  CO2 21* 21* 23 25  GLUCOSE 150* 93 111* 96  BUN 16 14 20 19   CREATININE 1.64* 1.41* 1.57* 1.71*  CALCIUM 9.3 9.2 9.2 9.1    Liver Function Tests: Recent Labs  Lab 06/05/21 0636  AST 29  ALT 29  ALKPHOS 114  BILITOT 0.7  PROT 7.6  ALBUMIN 3.3*   No results for input(s): LIPASE, AMYLASE in the last 168 hours. Recent Labs  Lab 06/05/21 0646  AMMONIA 30    CBC: Recent Labs  Lab 06/05/21 0636 06/06/21 0044 06/07/21 0056 06/08/21 0429  WBC 11.4* 12.1* 14.8* 10.9*  NEUTROABS 10.1*  --   --   --   HGB 16.0 15.1 15.4 14.7  HCT 49.5 48.3 47.5 46.2  MCV 83.1 83.1 82.8 83.4  PLT 185 179 185 169    INR: Recent Labs  Lab 06/05/21 0636 06/05/21 1220 06/06/21 0044 06/07/21 0056 06/08/21 0429  INR 2.4* 1.1 1.0 1.0 1.2    Other results:    Imaging   No results found.   Medications:     Scheduled Medications:  amiodarone  200 mg Oral Daily   atorvastatin  40 mg Oral Daily   Chlorhexidine Gluconate Cloth  6 each Topical Q0600   gabapentin  300 mg Oral BID   hydrALAZINE  25 mg Oral Q8H   losartan  25 mg Oral Daily   magnesium oxide  400 mg Oral BID   mouth rinse  15 mL Mouth Rinse BID   pantoprazole  40 mg Oral Daily   traZODone  50 mg Oral QHS    Infusions:   ceFAZolin (ANCEF) IV 1 g (06/08/21 0531)    PRN Medications: acetaminophen, hydrALAZINE, ondansetron (ZOFRAN) IV, polyethylene glycol, traMADol    Assessment/Plan:    1. Intracranial hemorrhage - seen by NSU - CT brain with intracerebral and intraventricular hemorrhage with obstructive hydrocephalus. INR 2.4 - AC reversed with Kcentra/vit k in ER - Underwent bedside ventriculostomy on 2/11 - Repeat head CT with resolution of hydrocephalus. Persistent hematoma - Appreciate NSU input - plan is to keep in draining until Thursday and then attempt to elevate it, and possibly remove it. - INR 1.2 today - With HM-3 device ok to continue to hold [name] for at least a few more days  (or longer) without too much risk   2. Chronic systolic CHF with HM III LVAD implant 04/20/17:  - Has done well with VAD support. - NYHA I  - Volume status looks good - He has been seen at [name] for transplant referral but has decided not to pursue VAD. No change   3. Paroxysmal AFL with RVR - had DC-CV on 10/17/18 with subsequent short episodes of AF after but has been mostly in NSR on low-dose amio (100 daily) - Had recurrent AF in 10/22. Amio increased to 200 bid and converted.  - Now in NSR. Continue amio 200 daily - No change   4. h/o UGI AVM bleed - s/p APC x 4 lesions in 2/19 - No recurrent bleeding  - Continue PPI - Off ASA due to GIB.  - Hgb stable at 14.7   5. CKD Stage IIIa:  - Creatinine baseline 1.3 - 1.7  - Creatinine 1.71 today - Continue to follow - Consider SGLT2i   5. VAD management - VAD interrogated personally. Parameters stable. - LDH has been stable  - INR goal 2.0-2.5 Off ASA due to GIB 2/19.  - INR 2.4  -> now reversed due to [place]. INR 1.2 - MAPs improved but still mildly elevated 80s-90s.  - Increase Losartan to 50 mg daily (home dose)  - Continue hydralazine 25 mg tid  - DL site looks good   6. CAD:  s/p PCI RCA/PLOM with DES x2 and DES to mild LAD 1/18 - Nos/s angina - Continue statin. Off ASA due to GIB   7. Hypomagnesemia. -  Continue mag supplements   Length of Stay: [address] [name] 06/08/2021, 9:43 AM  VAD Team --- VAD ISSUES ONLY--- Pager [phone] (7am - 7am)  Advanced Heart Failure Team  Pager [phone] (M-F; 7a - 5p)  Please contact [facility] for night-coverage after hours (5p -7a ) and weekends on [email or web address]  Patient seen with PA, agree with the above note.   Still with headache.   He has been on hydralazine and losartan increased to 50 daily today, MAP dropped and he was lightheaded.  Got 250 cc fluid bolus.   General: Well appearing this am. NAD.  HEENT: Ventriculostomy drain.  Neck: Supple, JVP 7-8 cm.  Carotids OK.  Cardiac:  Mechanical heart sounds with LVAD hum present.  Lungs:  CTAB, normal effort.  Abdomen:  NT, ND, no HSM. No bruits or masses. +BS  LVAD exit site: Well-healed and incorporated. Dressing dry and intact. No erythema or drainage. Stabilization device present and accurately applied. Driveline dressing changed daily per sterile technique. Extremities:  Warm and dry. No cyanosis, clubbing, rash, or edema.  Neuro:  Alert & oriented x  3. Cranial nerves grossly intact. Moves all 4 extremities w/o difficulty. Affect pleasant    LVAD parameters stable, LDH stable off warfarin.  He is going to need to remains off warfarin for the time being with ICH.  Will need discussion between cardiology and neurosurgery to determine timing of restart.   MAP dropped today, will stop hydralazine.  He can continue his home losartan dose tomorrow.   Possible removal of ventriculostomy Thursday, neurosurgery to determine.   Remains in NSR on amiodarone.   David [name] 06/08/2021 2:28 PM

## 2021-06-08 NOTE — Progress Notes (Signed)
Wife called unit, updated by this RN. Wife asked if okay to bring some food from home, as pt has had poor appetite and "he doesn't like the food" here. Said that she is welcome to bring food from home for pt so long as she double checks with the RN before giving to pt. She said she should arrive ~10/11am to hospital, requests MD update either at bedside or by phone when possible. Told her a note is in the chart for MD to do so, and this RN will remind day shift RN as well. Given emotional support, as she is anxious about pt's condition.

## 2021-06-08 NOTE — Progress Notes (Signed)
OT Cancellation Note  Patient Details Name: David [name] MRN: [id number] DOB: [birth year]   Cancelled Treatment:    Reason Eval/Treat Not Completed: Other (comment) (Eating. Low BP.) Will return as schedule allows.  Coleman, OTR/L Acute Rehab Pager: [phone] Office: [phone] 06/08/2021, 5:00 PM

## 2021-06-09 DIAGNOSIS — I5022 Chronic systolic (congestive) heart failure: Secondary | ICD-10-CM | POA: Diagnosis not present

## 2021-06-09 DIAGNOSIS — Z95811 Presence of heart assist device: Secondary | ICD-10-CM | POA: Diagnosis not present

## 2021-06-09 DIAGNOSIS — I629 Nontraumatic intracranial hemorrhage, unspecified: Secondary | ICD-10-CM | POA: Diagnosis not present

## 2021-06-09 LAB — CBC
HCT: 43.5 % (ref 39.0–52.0)
Hemoglobin: 14.1 g/dL (ref 13.0–17.0)
MCH: 26.9 pg (ref 26.0–34.0)
MCHC: 32.4 g/dL (ref 30.0–36.0)
MCV: 82.9 fL (ref 80.0–100.0)
Platelets: 164 10*3/uL (ref 150–400)
RBC: 5.25 MIL/uL (ref 4.22–5.81)
RDW: 14.8 % (ref 11.5–15.5)
WBC: 10 10*3/uL (ref 4.0–10.5)
nRBC: 0 % (ref 0.0–0.2)

## 2021-06-09 LAB — BASIC METABOLIC PANEL
Anion gap: 9 (ref 5–15)
BUN: 25 mg/dL — ABNORMAL HIGH (ref 6–20)
CO2: 24 mmol/L (ref 22–32)
Calcium: 8.5 mg/dL — ABNORMAL LOW (ref 8.9–10.3)
Chloride: 100 mmol/L (ref 98–111)
Creatinine, Ser: 2.18 mg/dL — ABNORMAL HIGH (ref 0.61–1.24)
GFR, Estimated: 34 mL/min — ABNORMAL LOW (ref >60)
Glucose, Bld: 110 mg/dL — ABNORMAL HIGH (ref 70–99)
Potassium: 4.3 mmol/L (ref 3.5–5.1)
Sodium: 133 mmol/L — ABNORMAL LOW (ref 135–145)

## 2021-06-09 LAB — MAGNESIUM: Magnesium: 1.9 mg/dL (ref 1.7–2.4)

## 2021-06-09 LAB — PROTIME-INR
INR: 1.2 (ref 0.8–1.2)
Prothrombin Time: 14.9 seconds (ref 11.4–15.2)

## 2021-06-09 LAB — LACTATE DEHYDROGENASE: LDH: 157 U/L (ref 98–192)

## 2021-06-09 MED ORDER — IPRATROPIUM BROMIDE 0.02 % IN SOLN
0.5000 mg | Freq: Four times a day (QID) | RESPIRATORY_TRACT | Status: DC | PRN
  Administered 2021-06-09: 0.5 mg via RESPIRATORY_TRACT
  Filled 2021-06-09: qty 2.5

## 2021-06-09 MED ORDER — DOCUSATE SODIUM 100 MG PO CAPS
200.0000 mg | ORAL_CAPSULE | Freq: Every day | ORAL | Status: DC
  Administered 2021-06-09 – 2021-06-28 (×18): 200 mg via ORAL
  Filled 2021-06-09 (×20): qty 2

## 2021-06-09 MED ORDER — POLYETHYLENE GLYCOL 3350 17 G PO PACK
17.0000 g | PACK | Freq: Every day | ORAL | Status: DC
  Administered 2021-06-10 – 2021-06-28 (×13): 17 g via ORAL
  Filled 2021-06-09 (×16): qty 1

## 2021-06-09 MED ORDER — IPRATROPIUM-ALBUTEROL 0.5-2.5 (3) MG/3ML IN SOLN
3.0000 mL | Freq: Three times a day (TID) | RESPIRATORY_TRACT | Status: DC
  Administered 2021-06-09 – 2021-06-11 (×5): 3 mL via RESPIRATORY_TRACT
  Filled 2021-06-09 (×5): qty 3

## 2021-06-09 MED ORDER — ALBUTEROL SULFATE (2.5 MG/3ML) 0.083% IN NEBU
2.5000 mg | INHALATION_SOLUTION | Freq: Four times a day (QID) | RESPIRATORY_TRACT | Status: DC | PRN
  Administered 2021-06-09 – 2021-06-11 (×3): 2.5 mg via RESPIRATORY_TRACT
  Filled 2021-06-09 (×3): qty 3

## 2021-06-09 NOTE — Progress Notes (Signed)
Physical Therapy Treatment Patient Details Name: David [name] MRN: [id number] DOB: 1966-01-29 Today's Date: 06/09/2021   History of Present Illness 56 y.o. male who presented 06/05/21 with worsening HA. CT showed right basal ganglia hemorrhage, intraventricular hemorrhage and obstructive hydrocephalus. S/p placement of right frontal ventriculostomy via bur hole 2/11. 2/12 Repeat head CT with resolution of hydrocephalus. PMH: ICD, HfrEF, HLD, HTN, s/p LVAD HM-III 04/20/18, CAD, CKD, MI, seizures    PT Comments    Pt continues to display deficits in awareness and, overall, in cognition that impact his safety. In addition, pt with noted L lateral lean this date, which this PT does not recall on eval and confirmed with wife that this is new. Notified RN and MD. Pt with noted balance deficits this date due to this L lateral lean, resulting in him being unable to maintain Rhomberg stance > 5 sec with eyes open and no UE support without LOB and modA to recover. Will continue to follow acutely. Updated d/c recs to HHPT due to change in pt's balance.   Recommendations for follow up therapy are one component of a multi-disciplinary discharge planning process, led by the attending physician.  Recommendations may be updated based on patient status, additional functional criteria and insurance authorization.  Follow Up Recommendations  Home health PT     Assistance Recommended at Discharge Frequent or constant Supervision/Assistance  Patient can return home with the following A little help with bathing/dressing/bathroom;Assistance with cooking/housework;Direct supervision/assist for financial management;Direct supervision/assist for medications management;Assist for transportation;A little help with walking and/or transfers;Help with stairs or ramp for entrance   Equipment Recommendations  None recommended by PT    Recommendations for Other Services       Precautions / Restrictions  Precautions Precautions: Fall;Other (comment) Precaution Comments: LVAD (x4 year hx), EVD (clamp prior to mobilizing) Restrictions Weight Bearing Restrictions: No     Mobility  Bed Mobility               General bed mobility comments: Pt up in recliner upon arrival.    Transfers Overall transfer level: Needs assistance Equipment used: None Transfers: Sit to/from Stand Sit to Stand: Min assist, +2 safety/equipment           General transfer comment: Min A for power up and gaining balance from recliner.    Ambulation/Gait Ambulation/Gait assistance: Min guard, +2 safety/equipment, Min assist Gait Distance (Feet): 230 Feet Assistive device: None Gait Pattern/deviations: Step-through pattern, Decreased stride length (leaning L) Gait velocity: decreased Gait velocity interpretation: <1.31 ft/sec, indicative of household ambulator   General Gait Details: Slow but fairly steady gait. Increased L lateral trunk sway/lean noted today, intermittent minA for safety to ensure no LOB. Able to change head positions and stop suddenly without LOB, but displays minimal change in speed when cued.   Stairs             Wheelchair Mobility    Modified Rankin (Stroke Patients Only) Modified Rankin (Stroke Patients Only) Pre-Morbid Rankin Score: No symptoms Modified Rankin: Moderately severe disability     Balance Overall balance assessment: Needs assistance Sitting-balance support: No upper extremity supported, Feet supported Sitting balance-Leahy Scale: Fair Sitting balance - Comments: Pt with L lateral lean and difficulty maintaining unsupported sitting in chair initially without LOB posteriorly, but improved by end of session. Postural control: Left lateral lean Standing balance support: No upper extremity supported, During functional activity Standing balance-Leahy Scale: Fair Standing balance comment: able to ambulate without UE support, but leans to  L. Unable to  maintain Rhomberg > 5 sec with eyes open without LOB and up to modA to recover         Rhomberg - Eyes Opened: 5 (seconds, LOB modA to recover)                  Cognition Arousal/Alertness: Awake/alert Behavior During Therapy: Flat affect Overall Cognitive Status: Impaired/Different from baseline Area of Impairment: Attention, Memory, Following commands, Safety/judgement, Awareness, Problem solving                   Current Attention Level: Sustained Memory: Decreased recall of precautions, Decreased short-term memory Following Commands: Follows one step commands with increased time, Follows one step commands consistently Safety/Judgement: Decreased awareness of safety, Decreased awareness of deficits Awareness: Intellectual Problem Solving: Slow processing, Decreased initiation, Difficulty sequencing, Requires verbal cues, Requires tactile cues General Comments: Pt with flat affect and needing extra time and repeated cues to lean to the R. Poor insight into his deficits, stating he has always leaned to his L but wife reports this is new.        Exercises Other Exercises Other Exercises: Knee bends standing, x12    General Comments General comments (skin integrity, edema, etc.): SpO2 not reading well, but appeared VSS on supplemental O2      Pertinent Vitals/Pain Pain Assessment Pain Assessment: No/denies pain    Home Living                          Prior Function            PT Goals (current goals can now be found in the care plan section) Acute Rehab PT Goals Patient Stated Goal: to improve PT Goal Formulation: With patient/family Time For Goal Achievement: 06/20/21 Potential to Achieve Goals: Good Progress towards PT goals: Progressing toward goals    Frequency    Min 3X/week      PT Plan Discharge plan needs to be updated    Co-evaluation              AM-PAC PT "6 Clicks" Mobility   Outcome Measure  Help needed  turning from your back to your side while in a flat bed without using bedrails?: A Little Help needed moving from lying on your back to sitting on the side of a flat bed without using bedrails?: A Little Help needed moving to and from a bed to a chair (including a wheelchair)?: A Little Help needed standing up from a chair using your arms (e.g., wheelchair or bedside chair)?: A Little Help needed to walk in hospital room?: A Little Help needed climbing 3-5 steps with a railing? : A Little 6 Click Score: 18    End of Session   Activity Tolerance: Patient tolerated treatment well Patient left: in chair;with call bell/phone within reach;with chair alarm set Nurse Communication: Mobility status (new L lateral lean) PT Visit Diagnosis: Unsteadiness on feet (R26.81);Other abnormalities of gait and mobility (R26.89);Difficulty in walking, not elsewhere classified (R26.2)     Time: 6063-0160 PT Time Calculation (min) (ACUTE ONLY): 39 min  Charges:  $Gait Training: 8-22 mins $Therapeutic Exercise: 8-22 mins $Therapeutic Activity: 8-22 mins                     Moishe Spice, PT, DPT Acute Rehabilitation Services  Pager: [phone] Office: [place] 06/09/2021, 3:59 PM

## 2021-06-09 NOTE — Progress Notes (Signed)
Subjective: The patient is alert and pleasant.  He looks well.  His family is at the bedside.  Objective: Vital signs in last 24 hours: Temp:  [97.8 F (36.6 C)-98.7 F (37.1 C)] 98.3 F (36.8 C) (02/15 1204) Pulse Rate:  [79-92] 86 (02/15 0800) Resp:  [14-26] 20 (02/15 0800) BP: (58-109)/(47-98) 96/82 (02/15 0800) SpO2:  [91 %-97 %] 93 % (02/15 0800) Weight:  [84.6 kg] 84.6 kg (02/15 0500) Estimated body mass index is 31.04 kg/m as calculated from the following:   Height as of this encounter: 5\' 5"  (1.651 m).   Weight as of this encounter: 84.6 kg.   Intake/Output from previous day: 02/14 0701 - 02/15 0700 In: 2430 [P.O.:2160; I.V.:120; IV Piggyback:150] Out: 472 [Urine:359; Drains:113] Intake/Output this shift: Total I/O In: 170 [P.O.:120; IV Piggyback:50] Out: 16 [Drains:16]  Physical exam the patient is alert and oriented.  He is moving all 4 extremities well.  The patient's ventriculostomy is patent and draining bloody spinal fluid.  Lab Results: Recent Labs    06/08/21 0429 06/09/21 0359  WBC 10.9* 10.0  HGB 14.7 14.1  HCT 46.2 43.5  PLT 169 164   BMET Recent Labs    06/08/21 0429 06/09/21 0359  NA 136 133*  K 4.0 4.3  CL 101 100  CO2 25 24  GLUCOSE 96 110*  BUN 19 25*  CREATININE 1.71* 2.18*  CALCIUM 9.1 8.5*    Studies/Results: No results found.  Assessment/Plan: Basal ganglia hemorrhage, intraventricular hemorrhage, hydrocephalus: The plan is to raise his ventriculostomy tomorrow and repeat his CAT scan on Friday.  If the ventricles remain small we can remove his ventriculostomy.  I have answered all the patient's wife's questions regarding the patient's stroke.  LOS: 4 days     [name] 06/09/2021, 12:33 PM     Patient ID: David [name], male   DOB: 12-12-1965, 56 y.o.   MRN: [id number]

## 2021-06-09 NOTE — Progress Notes (Signed)
LVAD Coordinator Rounding Note:  Admitted 06/05/21 due to [place] with hydrocephalus to Dr Bensimhon's service. Ventriculostomy placed at bedside by Dr Arnoldo Morale.   HM III LVAD implanted on 04/20/17 by Dr Cyndia Bent under destination therapy criteria.  Pt in the chair sleeping this morning. He is alert and oriented x 3. Pt does not remember who I am this morning or what I do. At times his responses are delayed, which is not his baseline.     Continues with mild HA. No focal neuro deficits. Ventriculostomy still in place.    Hydralazine stopped yesterday for hypotension. Given 250 NS.  Remains on losartan.  MAPs 75-100  Currently on Ancef 1 gram IV every 8 hours with drain in place per neurosurgery. WBC 10 today. Plan to continue drain until Thursday per neurosurgery.    INR 1.2 today. Not on anticoagulation for now. Timing of restart per Dr Haroldine Laws and neurosurgery.   Vital signs: Temp: 98.7 HR: 79 Doppler Pressure: 90 Automatic BP: 96/82 (89) O2 Sat: 93% on RA Wt:182.3>186>186.5 lbs    LVAD interrogation reveals:  Speed: 5400 Flow: 4.8 Power:  4.2 PI: 4.2  Alarms: none Events:  2 PI events today; 50+ yesterday Hematocrit: 44  Fixed speed: 5400 Low speed limit: 5100  Drive Line: CDI. Drive line anchor re-applied. Weekly dressing changes per bedside RN or pt's wife. Next dressing change due: 06/14/21.  Labs:  LDH trend: [phone]  INR trend: 1.0>1.2  WBC: 14.8>10.9>10  Anticoagulation Plan: -INR Goal: 2 - 2.5  (anticoagulation on hold currently due to [place]) -ASA Dose: none  Device: - Medtronic BiV -Therapies: on- VF 200, VT 167  AT 171 monitored - Last check: 04/29/21  Arrythmias: Currently NSR (history of afib/flutter). Taking Amiodarone 200 mg daily.   Adverse Events on VAD: - Readmitted 2/19 hgb 3.8. INR 2.9.transfused 4u pRBCs. 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 Call VAD coordinator for any VAD equipment or drive line issues 2. Weekly drive line dressing change per bedside RN or pt's wife  Tanda Rockers RN [place] Coordinator  Office: [phone]  24/7 Pager: [phone]

## 2021-06-09 NOTE — Progress Notes (Signed)
Occupational Therapy Treatment Patient Details Name: David [name] MRN: [id number] DOB: 1965/11/24 Today's Date: 06/09/2021   History of present illness 56 y.o. male who presented 06/05/21 with worsening HA. CT showed right basal ganglia hemorrhage, intraventricular hemorrhage and obstructive hydrocephalus. S/p placement of right frontal ventriculostomy via bur hole 2/11. 2/12 Repeat head CT with resolution of hydrocephalus. PMH: ICD, HfrEF, HLD, HTN, s/p LVAD HM-III 04/20/18, CAD, CKD, MI, seizures   OT comments  Pt fatigued after working with PT.  Worked with pt on safe management of LVAD equipment and how to use external cues to assist with orientation.  Pt required mod cues, overall to switch from [facility] power <> battery.   Wife present, and would like to practice assisting him with management of LVAD equipment as she reports she doesn't feel as confident as she would like since he has been managing it independently for the past 4 years.  Will continue to follow.    Recommendations for follow up therapy are one component of a multi-disciplinary discharge planning process, led by the attending physician.  Recommendations may be updated based on patient status, additional functional criteria and insurance authorization.    Follow Up Recommendations  Outpatient OT    Assistance Recommended at Discharge Frequent or constant Supervision/Assistance  Patient can return home with the following  Direct supervision/assist for medications management;Direct supervision/assist for financial management;A little help with walking and/or transfers;A little help with bathing/dressing/bathroom;Other (comment) (direct supervision with managing LVAD equipment)   Equipment Recommendations  None recommended by OT    Recommendations for Other Services      Precautions / Restrictions Precautions Precautions: Fall;Other (comment) Precaution Comments: LVAD (x4 year hx), EVD (clamp prior to mobilizing)        Mobility Bed Mobility               General bed mobility comments: sitting up in chair    Transfers                   General transfer comment: deferred as pt fatigued after PT     Balance                                           ADL either performed or assessed with clinical judgement   ADL                                              Extremity/Trunk Assessment Upper Extremity Assessment Upper Extremity Assessment: Overall WFL for tasks assessed   Lower Extremity Assessment Lower Extremity Assessment: Defer to PT evaluation        Vision       Perception     Praxis      Cognition Arousal/Alertness: Awake/alert Behavior During Therapy: Flat affect Overall Cognitive Status: Impaired/Different from baseline Area of Impairment: Orientation, Attention, Memory, Following commands, Safety/judgement, Awareness, Problem solving                 Orientation Level: Disoriented to, Time Current Attention Level: Sustained Memory: Decreased recall of precautions, Decreased short-term memory Following Commands: Follows one step commands with increased time, Follows one step commands consistently Safety/Judgement: Decreased awareness of safety, Decreased awareness of deficits Awareness: Intellectual Problem Solving: Slow processing, Decreased initiation, Difficulty  sequencing, Requires verbal cues, Requires tactile cues General Comments: Pt denies cognitive deficits.  He required mod cues for use of external cues for orientation.  He was able to switch from [facility] power < battery with min cues, and mod cues to switch battery to [facility] power.  He was able to perform self test wtih mod cues        Exercises Exercises: Other exercises Other Exercises Other Exercises: worked with pt on switching AC power <> battery, review of precautions, and how to perform self test.  Wife present and voices desire to also practice as he has  been performing this independently for the past 4 years and doesn't feel confident with assisting/cuing him.    Shoulder Instructions       General Comments      Pertinent Vitals/ Pain       Pain Assessment Pain Assessment: No/denies pain  Home Living                                          Prior Functioning/Environment              Frequency  Min 3X/week        Progress Toward Goals  OT Goals(current goals can now be found in the care plan section)  Progress towards OT goals: Progressing toward goals     Plan Discharge plan remains appropriate    Co-evaluation                 AM-PAC OT "6 Clicks" Daily Activity     Outcome Measure   Help from another person eating meals?: A Little Help from another person taking care of personal grooming?: A Little Help from another person toileting, which includes using toliet, bedpan, or urinal?: A Little Help from another person bathing (including washing, rinsing, drying)?: A Lot Help from another person to put on and taking off regular upper body clothing?: A Lot Help from another person to put on and taking off regular lower body clothing?: A Lot 6 Click Score: 15    End of Session    OT Visit Diagnosis: Cognitive communication deficit (R41.841)   Activity Tolerance Patient limited by fatigue   Patient Left in chair;with call bell/phone within reach;with family/visitor present   Nurse Communication Mobility status        Time: 4401-0272 OT Time Calculation (min): 27 min  Charges: OT General Charges $OT Visit: 1 Visit OT Treatments $Self Care/Home Management : 23-37 mins  Nilsa Nutting., OTR/L Acute Rehabilitation Services Pager [phone] Office [phone]   [name] M 06/09/2021, 3:02 PM

## 2021-06-09 NOTE — Progress Notes (Signed)
2200- pt bladder scan revealed 464ml urine. Pt states he does feel like he needs to urinate and it is causing discomfort. He has not been able to void after previous in and out catheterization which was performed at noon. Will perform in and out catheterization per protocol.  2320- intermittent urinary cath performed. Tolerated well. Relieved of 525 ml of urine with scant residual. Adonis Huguenin, RN at bedside witnessing intermittent cath.  0400- RT placed pt on breathing treatment due to worsening wheeze.  0600- bladder scan performed. Pt stating he needs to urinate. Attempted to urinate in urinal for 1 hour. Has not voided since previous intermittent cath. Bladder scan reading documented. Indwelling catheter placed per protocol. Return documented.

## 2021-06-09 NOTE — Progress Notes (Addendum)
Advanced Heart Failure VAD Team Note  PCP-Cardiologist: Glori Bickers, MD   Subjective:    2/11 Presented with David [name] 2/11 Underwent bedside ventriculostomy wit NSU 2/12 Repeat head CT with resolution of hydrocephalus. Persistent hematoma  Continues with mild HA. No focal neuro deficits. Ventriculostomy still in place.   Hydralazine stopped yesterday for hypotension. Given 250 NS.  Remains on losartan.  MAPs 75-100  Scr 1.71 -> 2.2  LVAD INTERROGATION:  HeartMate 3 LVAD:   Flow 4.9 liters/min, speed 5450, power 4.0, PI 3.5. VAD interrogated personally. Parameters stable.  Objective:    Vital Signs:   Temp:  [97.8 F (36.6 C)-98.7 F (37.1 C)] 98.7 F (37.1 C) (02/15 0752) Pulse Rate:  [75-92] 81 (02/15 0700) Resp:  [14-26] 26 (02/15 0700) BP: (58-109)/(41-98) 97/87 (02/15 0700) SpO2:  [91 %-97 %] 94 % (02/15 0700) Weight:  [84.6 kg] 84.6 kg (02/15 0500) Last BM Date : 06/05/21 Mean arterial Pressure 75-100   Intake/Output:   Intake/Output Summary (Last 24 hours) at 06/09/2021 0808 Last data filed at 06/09/2021 0755 Gross per 24 hour  Intake 2310 ml  Output 464 ml  Net 1846 ml      Physical Exam    General:  NAD.  HEENT: normal + ventriculostomy drain/dressing Neck: supple. JVP not elevated.  Carotids 2+ bilat; no bruits. No lymphadenopathy or thryomegaly appreciated. Cor: LVAD hum.  Lungs: Clear. Occasional wheeze  Abdomen: obese soft, nontender, non-distended. No hepatosplenomegaly. No bruits or masses. Good bowel sounds. Driveline site clean. Anchor in place.  Extremities: no cyanosis, clubbing, rash. Warm no edema  Neuro: alert & oriented x 3. No focal deficits. Moves all 4 without problem   Telemetry   NSR 80-90s Personally reviewed   Labs   Basic Metabolic Panel: Recent Labs  Lab 06/05/21 0636 06/06/21 0044 06/07/21 0056 06/08/21 0429 06/09/21 0359  NA 137 140 137 136 133*  K 3.9 4.1 4.1 4.0 4.3  CL 105 108 105 101 100  CO2 21* 21* 23  25 24   GLUCOSE 150* 93 111* 96 110*  BUN 16 14 20 19  25*  CREATININE 1.64* 1.41* 1.57* 1.71* 2.18*  CALCIUM 9.3 9.2 9.2 9.1 8.5*  MG  --   --   --   --  1.9     Liver Function Tests: Recent Labs  Lab 06/05/21 0636  AST 29  ALT 29  ALKPHOS 114  BILITOT 0.7  PROT 7.6  ALBUMIN 3.3*    No results for input(s): LIPASE, AMYLASE in the last 168 hours. Recent Labs  Lab 06/05/21 0646  AMMONIA 30     CBC: Recent Labs  Lab 06/05/21 0636 06/06/21 0044 06/07/21 0056 06/08/21 0429 06/09/21 0359  WBC 11.4* 12.1* 14.8* 10.9* 10.0  NEUTROABS 10.1*  --   --   --   --   HGB 16.0 15.1 15.4 14.7 14.1  HCT 49.5 48.3 47.5 46.2 43.5  MCV 83.1 83.1 82.8 83.4 82.9  PLT 185 179 185 169 164     INR: Recent Labs  Lab 06/05/21 1220 06/06/21 0044 06/07/21 0056 06/08/21 0429 06/09/21 0359  INR 1.1 1.0 1.0 1.2 1.2     Other results:    Imaging   No results found.   Medications:     Scheduled Medications:  amiodarone  200 mg Oral Daily   atorvastatin  40 mg Oral Daily   Chlorhexidine Gluconate Cloth  6 each Topical Q0600   gabapentin  300 mg Oral BID   losartan  50  mg Oral Daily   magnesium oxide  400 mg Oral BID   mouth rinse  15 mL Mouth Rinse BID   pantoprazole  40 mg Oral Daily   traZODone  50 mg Oral QHS    Infusions:   ceFAZolin (ANCEF) IV 1 g (06/09/21 0615)    PRN Medications: acetaminophen, hydrALAZINE, ondansetron (ZOFRAN) IV, oxyCODONE, polyethylene glycol, traMADol    Assessment/Plan:    1. Intracranial hemorrhage - seen by NSU - CT brain with intracerebral and intraventricular hemorrhage with obstructive hydrocephalus. INR 2.4 - AC reversed with Kcentra/vit k in ER - Underwent bedside ventriculostomy on 2/11 - Repeat head CT with resolution of hydrocephalus. Persistent hematoma - Appreciate NSU input - plan is to keep in draining until Thursday and then attempt to elevate it, and possibly remove it. - INR 1.2 today - With HM-3 device ok  to continue to hold [facility] for at least a few more days (or longer) without too much risk. Will start warfarin (no heparin) once drain out   2. Chronic systolic CHF with HM III LVAD implant 04/20/17:  - Has done well with VAD support. - NYHA I  - Volume status ok  - He has been seen at [facility] for transplant referral but has decided not to pursue VAD. No change   3. Paroxysmal AFL with RVR - had DC-CV on 10/17/18 with subsequent short episodes of AF after but has been mostly in NSR on low-dose amio (100 daily) - Had recurrent AF in 10/22. Amio increased to 200 bid and converted.  - Now in NSR. Continue amio 200 daily - No change   4. h/o UGI AVM bleed - s/p APC x 4 lesions in 2/19 - No recurrent bleeding  - Continue PPI - Off ASA due to GIB.  - Hgb stable at 14.7   5. AKI on CKD Stage IIIa:  - Creatinine baseline 1.3 - 1.7  - Creatinine 1.71 -> 2.18  today - Suspect ATN from hypotension.  - Keep MAP 70-90. Avoid hypotension   5. VAD management - VAD interrogated personally. Parameters stable. - LDH has been stable  - INR goal 2.0-2.5 Off ASA due to GIB 2/19.  - INR 2.4  -> now reversed due to [place]. INR 1.2 - MAPs labile see discussion below - DL site looks good   6. HTN - goal MAP 70-80 with ICH  - losartan increased yesterday with resultant hypotension and AKI - Hydralazine stopped - follow closely. Keep MAPs 70-90    6. CAD:  s/p PCI RCA/PLOM with DES x2 and DES to mild LAD 1/18 - No angina - Continue statin. Off ASA due to GIB   7. Hypomagnesemia. -  Continue mag supplements   Length of Stay: 4  Glori Bickers, MD 06/09/2021, 8:08 AM  VAD Team --- VAD ISSUES ONLY--- Pager [phone] (7am - 7am)  Advanced Heart Failure Team  Pager [phone] (M-F; 7a - 5p)  Please contact [facility] for night-coverage after hours (5p -7a ) and weekends on [email or web address]

## 2021-06-10 DIAGNOSIS — I629 Nontraumatic intracranial hemorrhage, unspecified: Secondary | ICD-10-CM | POA: Diagnosis not present

## 2021-06-10 DIAGNOSIS — Z95811 Presence of heart assist device: Secondary | ICD-10-CM | POA: Diagnosis not present

## 2021-06-10 DIAGNOSIS — I5022 Chronic systolic (congestive) heart failure: Secondary | ICD-10-CM | POA: Diagnosis not present

## 2021-06-10 LAB — BASIC METABOLIC PANEL
Anion gap: 9 (ref 5–15)
BUN: 23 mg/dL — ABNORMAL HIGH (ref 6–20)
CO2: 26 mmol/L (ref 22–32)
Calcium: 8.5 mg/dL — ABNORMAL LOW (ref 8.9–10.3)
Chloride: 97 mmol/L — ABNORMAL LOW (ref 98–111)
Creatinine, Ser: 1.8 mg/dL — ABNORMAL HIGH (ref 0.61–1.24)
GFR, Estimated: 43 mL/min — ABNORMAL LOW (ref >60)
Glucose, Bld: 114 mg/dL — ABNORMAL HIGH (ref 70–99)
Potassium: 4 mmol/L (ref 3.5–5.1)
Sodium: 132 mmol/L — ABNORMAL LOW (ref 135–145)

## 2021-06-10 LAB — MAGNESIUM: Magnesium: 1.9 mg/dL (ref 1.7–2.4)

## 2021-06-10 LAB — LACTATE DEHYDROGENASE: LDH: 162 U/L (ref 98–192)

## 2021-06-10 LAB — CBC
HCT: 42.3 % (ref 39.0–52.0)
Hemoglobin: 13.5 g/dL (ref 13.0–17.0)
MCH: 26.9 pg (ref 26.0–34.0)
MCHC: 31.9 g/dL (ref 30.0–36.0)
MCV: 84.3 fL (ref 80.0–100.0)
Platelets: 176 10*3/uL (ref 150–400)
RBC: 5.02 MIL/uL (ref 4.22–5.81)
RDW: 14.5 % (ref 11.5–15.5)
WBC: 11 10*3/uL — ABNORMAL HIGH (ref 4.0–10.5)
nRBC: 0 % (ref 0.0–0.2)

## 2021-06-10 LAB — PROTIME-INR
INR: 1.2 (ref 0.8–1.2)
Prothrombin Time: 15.2 seconds (ref 11.4–15.2)

## 2021-06-10 MED ORDER — MAGNESIUM SULFATE 2 GM/50ML IV SOLN
2.0000 g | Freq: Once | INTRAVENOUS | Status: AC
  Administered 2021-06-10: 2 g via INTRAVENOUS
  Filled 2021-06-10: qty 50

## 2021-06-10 MED ORDER — SORBITOL 70 % SOLN
60.0000 mL | Freq: Once | Status: AC
  Administered 2021-06-10: 60 mL via ORAL
  Filled 2021-06-10: qty 60

## 2021-06-10 NOTE — Progress Notes (Signed)
Meghan,NP Neuro at bedside rounding.  Ventriculostomy drain raise from 26mmHg/cm H2O to now 73mmHg/cmH2O.  Currently awake, alert and oriented w/intermittent confusion as previously assessed.  Will monitor for any neuro changes.

## 2021-06-10 NOTE — Progress Notes (Signed)
LVAD Coordinator Rounding Note:  Admitted 06/05/21 due to [place] with hydrocephalus to Dr Bensimhon's service. Ventriculostomy placed at bedside by Dr Arnoldo Morale.   HM III LVAD implanted on 04/20/17 by Dr Cyndia Bent under destination therapy criteria.  Pt in the chair sleeping this morning. He is alert and oriented x 3. At times his responses are delayed, which is not his baseline.   Remains on losartan.  MAPs 70-102.   Continues with mild HA. No focal neuro deficits. Ventriculostomy still in place.    Currently on Ancef 1 gram IV every 8 hours with drain in place per neurosurgery. WBC 10 today. Plan to continue drain until Thursday per neurosurgery.   Mild fluid overload, JVD up, Wt up 8 lb from admit wt. Denies dyspnea.   Pt unable to urinate overnight - foley placed.   INR 1.2 today. Not on anticoagulation for now. Timing of restart per Dr Haroldine Laws and neurosurgery.   Vital signs: Temp: 99.1 HR: 85 Doppler Pressure: not done Automatic BP: 102/75 (85) O2 Sat: 96% on 3L/ Wt:182.3>186>186.5>189.6 lbs    LVAD interrogation reveals:  Speed: 5400 Flow: 4.7 Power:  4.2 PI: 4.6  Alarms: none Events: none Hematocrit: 42  Fixed speed: 5400 Low speed limit: 5100  Drive Line: CDI. Drive line anchor re-applied. Weekly dressing changes per bedside RN or pt's wife. Next dressing change due: 06/14/21.  Labs:  LDH trend: 166>176>157>162  INR trend: 1.0>1.2  WBC: 14.8>10.9>10>11  Anticoagulation Plan: -INR Goal: 2 - 2.5  (anticoagulation on hold currently due to [place]) -ASA Dose: none  Device: - Medtronic BiV -Therapies: on- VF 200, VT 167  AT 171 monitored - Last check: 04/29/21  Arrythmias: Currently NSR (history of afib/flutter). Taking Amiodarone 200 mg daily.   Adverse Events on VAD: - Readmitted 2/19 hgb 3.8. INR 2.9.transfused 4u pRBCs. 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 Call VAD coordinator for any VAD equipment or drive line issues 2. Weekly drive line dressing change per bedside RN or pt's wife  Tanda Rockers RN [place] Coordinator  Office: [phone]  24/7 Pager: [phone]

## 2021-06-10 NOTE — Progress Notes (Addendum)
Advanced Heart Failure VAD Team Note  PCP-Cardiologist: Glori Bickers, MD   Subjective:    2/11 Presented with [place] 2/11 Underwent bedside ventriculostomy wit NSU 2/12 Repeat head CT with resolution of hydrocephalus. Persistent hematoma  Continues with mild HA. Ventriculostomy still in place. Mildly confused. A&O to person and place. Not time.  Remains on losartan.  MAPs 70-102.   Scr 1.71 -> 2.2->1.8   Mild fluid overload, JVD up, Wt up 8 lb from admit wt. Denies dyspnea.   INR 1.2. Hgb stable 13.5  LDH 162   LVAD INTERROGATION:  HeartMate 3 LVAD:   Flow 4.8 liters/min, speed 5450, power 4.2, PI 4.5. No PI events. VAD interrogated personally. Parameters stable.  Objective:    Vital Signs:   Temp:  [98.3 F (36.8 C)-99.1 F (37.3 C)] 99.1 F (37.3 C) (02/16 0803) Pulse Rate:  [77-88] 85 (02/16 0800) Resp:  [12-18] 15 (02/16 0800) BP: (73-121)/(50-96) 102/75 (02/16 0800) SpO2:  [89 %-97 %] 96 % (02/16 0834) Weight:  [86 kg] 86 kg (02/16 0500) Last BM Date : 06/05/21 Mean arterial Pressure 70-102   Intake/Output:   Intake/Output Summary (Last 24 hours) at 06/10/2021 0928 Last data filed at 06/10/2021 0800 Gross per 24 hour  Intake 465 ml  Output 2331 ml  Net -1866 ml     Physical Exam    General:  Sitting up in chair. No distress  HEENT: normal + ventriculostomy drain/dressing Neck: supple. JVP 8 cm.  Carotids 2+ bilat; no bruits. No lymphadenopathy or thryomegaly appreciated. Cor: LVAD hum.  Lungs: CTAB  Abdomen: distended, quite BS, NT. No hepatosplenomegaly. No bruits or masses.  Driveline site clean. Anchor in place.  Extremities: no cyanosis, clubbing, rash. Warm no edema  Neuro: alert & oriented x 3. No focal deficits. Moves all 4 without problem   Telemetry   NSR 80-90s Personally reviewed   Labs   Basic Metabolic Panel: Recent Labs  Lab 06/06/21 0044 06/07/21 0056 06/08/21 0429 06/09/21 0359 06/10/21 0215  NA 140 137 136 133* 132*   K 4.1 4.1 4.0 4.3 4.0  CL 108 105 101 100 97*  CO2 21* 23 25 24 26   GLUCOSE 93 111* 96 110* 114*  BUN 14 20 19  25* 23*  CREATININE 1.41* 1.57* 1.71* 2.18* 1.80*  CALCIUM 9.2 9.2 9.1 8.5* 8.5*  MG  --   --   --  1.9 1.9    Liver Function Tests: Recent Labs  Lab 06/05/21 0636  AST 29  ALT 29  ALKPHOS 114  BILITOT 0.7  PROT 7.6  ALBUMIN 3.3*   No results for input(s): LIPASE, AMYLASE in the last 168 hours. Recent Labs  Lab 06/05/21 0646  AMMONIA 30    CBC: Recent Labs  Lab 06/05/21 0636 06/06/21 0044 06/07/21 0056 06/08/21 0429 06/09/21 0359 06/10/21 0215  WBC 11.4* 12.1* 14.8* 10.9* 10.0 11.0*  NEUTROABS 10.1*  --   --   --   --   --   HGB 16.0 15.1 15.4 14.7 14.1 13.5  HCT 49.5 48.3 47.5 46.2 43.5 42.3  MCV 83.1 83.1 82.8 83.4 82.9 84.3  PLT 185 179 185 169 164 176    INR: Recent Labs  Lab 06/06/21 0044 06/07/21 0056 06/08/21 0429 06/09/21 0359 06/10/21 0215  INR 1.0 1.0 1.2 1.2 1.2    Other results:    Imaging   No results found.   Medications:     Scheduled Medications:  amiodarone  200 mg Oral Daily  atorvastatin  40 mg Oral Daily   Chlorhexidine Gluconate Cloth  6 each Topical Q0600   docusate sodium  200 mg Oral Daily   gabapentin  300 mg Oral BID   ipratropium-albuterol  3 mL Nebulization TID   losartan  50 mg Oral Daily   magnesium oxide  400 mg Oral BID   mouth rinse  15 mL Mouth Rinse BID   pantoprazole  40 mg Oral Daily   polyethylene glycol  17 g Oral Daily   sorbitol  60 mL Oral Once   traZODone  50 mg Oral QHS    Infusions:   ceFAZolin (ANCEF) IV Stopped (06/10/21 0731)    PRN Medications: acetaminophen, albuterol, hydrALAZINE, ipratropium, ondansetron (ZOFRAN) IV, oxyCODONE, polyethylene glycol, traMADol    Assessment/Plan:    1. Intracranial hemorrhage - seen by NSU - CT brain with intracerebral and intraventricular hemorrhage with obstructive hydrocephalus. INR 2.4 - AC reversed with Kcentra/vit k in  ER - Underwent bedside ventriculostomy on 2/11 - Repeat head CT with resolution of hydrocephalus. Persistent hematoma. C/w drain  - Appreciate NSU input - INR 1.2 today - With HM-3 device ok to continue to hold [facility] for at least a few more days (or longer) without too much risk. Will start warfarin (no heparin) once drain out   2. Chronic systolic CHF with HM III LVAD implant 04/20/17:  - Has done well with VAD support. - NYHA I  - Mildly fluid overloaded, may need PRN lasix   - He has been [facility] at [facility] for [facility] referral but has decided not to pursue VAD. No change   3. Paroxysmal AFL with RVR - had DC-CV on 10/17/18 with subsequent short episodes of AF after but has been mostly in NSR on low-dose amio (100 daily) - Had recurrent AF in 10/22. Amio increased to 200 bid and converted.  - Now in NSR. Continue amio 200 daily - No change   4. h/o UGI AVM bleed - s/p APC x 4 lesions in 2/19 - No recurrent bleeding  - Continue PPI - Off ASA due to GIB.  - Hgb stable at  13.5   5. AKI on CKD Stage IIIa:  - Creatinine baseline 1.3 - 1.7  - Creatinine 1.71 -> 2.18 - Suspect ATN from hypotension.  - improving, SCr down to 1.8 today  - Keep MAP 70-90. Avoid hypotension   5. VAD management - VAD interrogated personally. Parameters stable. - LDH has been stable  - INR goal 2.0-2.5 Off ASA due to GIB 2/19.  - INR 2.4  -> now reversed due to [place]. INR 1.2 - MAPs labile see discussion below - DL site looks good   6. HTN - goal MAP 70-80 with ICH  - losartan increased 2/14 with resultant hypotension and AKI - Hydralazine stopped - follow closely. Keep MAPs 70-90    6. CAD:  s/p PCI RCA/PLOM with DES x2 and DES to mild LAD 1/18 - No angina - Continue statin. Off ASA due to GIB   7. Hypomagnesemia. -  Continue mag supplements  8. Constipation - add sorbitol   Length of Stay: [address], PA-C 06/10/2021, 9:28 AM  VAD Team --- VAD ISSUES ONLY--- Pager [phone] (7am  - 7am)  Advanced Heart Failure Team  Pager [phone] (M-F; 7a - 5p)  Please contact [facility] for night-coverage after hours (5p -7a ) and weekends on [email or web address]  Agree with above.   Still with ventriculostomy drain. Has persistent HA. Mildly  confused but seems to be improving. MAPs labile. Weight up slightly. Denies dyspnea. Scr improving  General:  Sitting in chair  HEENT: normal  + drain Neck: supple. JVP 7-8.  Carotids 2+ bilat; no bruits. No lymphadenopathy or thryomegaly appreciated. Cor: LVAD hum.  Lungs: Clear. Abdomen: soft, nontender, non-distended. No hepatosplenomegaly. No bruits or masses. Good bowel sounds. Driveline site clean. Anchor in place.  Extremities: no cyanosis, clubbing, rash. Warm no edema  Neuro: alert mildly confused No focal deficits. Moves all 4 without problem   Continues with some confusion but seems to be clearing. Still with som drainage from ventriculostomy. Plan repeat CT tomorrow per NSU. Possibly pull drain soon. No AC yet. Adjust anti-HTN to keep MAPs 70-90. Renal function improving. VAD interrogated personally. Parameters stable.  CRITICAL CARE Performed by: Glori Bickers  Total critical care time: 35 minutes  Critical care time was exclusive of separately billable procedures and treating other patients.  Critical care was necessary to treat or prevent imminent or life-threatening deterioration.  Critical care was time spent personally by me (independent of midlevel providers or residents) on the following activities: development of treatment plan with patient and/or surrogate as well as nursing, discussions with consultants, evaluation of patient's response to treatment, examination of patient, obtaining history from patient or surrogate, ordering and performing treatments and interventions, ordering and review of laboratory studies, ordering and review of radiographic studies, pulse oximetry and re-evaluation of patient's  condition.  Glori Bickers, MD  11:17 AM

## 2021-06-10 NOTE — Progress Notes (Signed)
° °  Providing Compassionate, Quality Care - Together   Subjective: Nurse reports no issues overnight. Patient's mental status remains stable.  Objective: Vital signs in last 24 hours: Temp:  [98.3 F (36.8 C)-99.1 F (37.3 C)] 99.1 F (37.3 C) (02/16 0803) Pulse Rate:  [77-88] 80 (02/16 1000) Resp:  [12-18] 17 (02/16 1000) BP: (73-121)/(50-96) 105/91 (02/16 1000) SpO2:  [89 %-97 %] 97 % (02/16 1000) Weight:  [86 kg] 86 kg (02/16 0500)  Intake/Output from previous day: 02/15 0701 - 02/16 0700 In: 30 [P.O.:360; IV Piggyback:150] Out: 2336 [Urine:2250; Drains:86] Intake/Output this shift: Total I/O In: 185 [Other:135; IV Piggyback:50] Out: 6 [Drains:6]  Responds to voice Oriented to self and place, disoriented to time and situation PERRLA Speech clear MAE, Strength, sensation, and coordination intact EVD site clean, dry, and intact  Lab Results: Recent Labs    06/09/21 0359 06/10/21 0215  WBC 10.0 11.0*  HGB 14.1 13.5  HCT 43.5 42.3  PLT 164 176   BMET Recent Labs    06/09/21 0359 06/10/21 0215  NA 133* 132*  K 4.3 4.0  CL 100 97*  CO2 24 26  GLUCOSE 110* 114*  BUN 25* 23*  CREATININE 2.18* 1.80*  CALCIUM 8.5* 8.5*    Studies/Results: No results found.  Assessment/Plan: Patient with LVAD on Coumadin suffered an intracerebral and intraventricular hemorrhage on 06/05/2021. He had associated obstructive hydrocephalus. His Coumadin was reversed with KCentra. Dr. Arnoldo Morale placed an EVD on 06/05/2021. Dr. Jeffie Pollock admitted patient to [facility] for LVAD management.   LOS: 5 days   -EVD raised to 30 cm H20. -Will get follow up CT scan tomorrow morning. If no hydrocephalus on scan, will pull EVD.    Viona Gilmore, DNP, AGNP-C Nurse Practitioner  [facility] [place] [address] [place], [place], [place] [address] P: [phone]     F: [phone]  06/10/2021, 10:56 AM

## 2021-06-10 NOTE — Progress Notes (Signed)
2215- Found patient to have fluid on his pillow case. 29ml output for last two hours from drain and visible trickle of fluid from ventriculostomy insertion site. Upon neuro assessment pt is newly disoriented to place. He believes he is at home in bed. He is alert to self and year only.   [facility] called . Dr Ellene Route made aware of events. RN told to decrease cmH20 from 30 to 20. Will report improvements/ changes to neurosurgery.   2305- 80ml of fluid drained in past hour. No new drainage from insertion site. Pt is now oriented to person, place, and time. He remains unclear about situation and is unable to recall events from earlier today (ex: doesn't remember who came to visit). This is consistent with prior neuro assessments.   0200- Neuro status remains consistent with 2305 assessment. Scant drainage from insertion site.   0400- Pt complaining about pain. Stating he has not received any pain medicine all night. Pt informed that he did receive pain medicine at 2228 06/10/21.   Just now I administered oxycodone. Before leaving the room I asked pt if he needed anything else. He stated he was still waiting on me to give him his pain medicine.   Pt is alert and oriented to person, place, time and some of his situation. When asking what brought him into the hospital he states that he had a brain bleed. Despite this, he is still blurry on the details of past several days.   Ventriculostomy insertion site is still having small amount of drainage. Waiting for transport to arrive so we can head down to CT.   0455- Arrived back from CT. Tolerated well.

## 2021-06-11 ENCOUNTER — Inpatient Hospital Stay (HOSPITAL_COMMUNITY): Payer: Medicare HMO

## 2021-06-11 DIAGNOSIS — Z95811 Presence of heart assist device: Secondary | ICD-10-CM | POA: Diagnosis not present

## 2021-06-11 DIAGNOSIS — I5022 Chronic systolic (congestive) heart failure: Secondary | ICD-10-CM | POA: Diagnosis not present

## 2021-06-11 DIAGNOSIS — I629 Nontraumatic intracranial hemorrhage, unspecified: Secondary | ICD-10-CM | POA: Diagnosis not present

## 2021-06-11 LAB — BASIC METABOLIC PANEL
Anion gap: 7 (ref 5–15)
BUN: 17 mg/dL (ref 6–20)
CO2: 27 mmol/L (ref 22–32)
Calcium: 8.6 mg/dL — ABNORMAL LOW (ref 8.9–10.3)
Chloride: 97 mmol/L — ABNORMAL LOW (ref 98–111)
Creatinine, Ser: 1.35 mg/dL — ABNORMAL HIGH (ref 0.61–1.24)
GFR, Estimated: >60 mL/min (ref >60)
Glucose, Bld: 106 mg/dL — ABNORMAL HIGH (ref 70–99)
Potassium: 4.6 mmol/L (ref 3.5–5.1)
Sodium: 131 mmol/L — ABNORMAL LOW (ref 135–145)

## 2021-06-11 LAB — CBC
HCT: 41.8 % (ref 39.0–52.0)
Hemoglobin: 13.3 g/dL (ref 13.0–17.0)
MCH: 26.7 pg (ref 26.0–34.0)
MCHC: 31.8 g/dL (ref 30.0–36.0)
MCV: 83.8 fL (ref 80.0–100.0)
Platelets: 170 10*3/uL (ref 150–400)
RBC: 4.99 MIL/uL (ref 4.22–5.81)
RDW: 14.6 % (ref 11.5–15.5)
WBC: 10.1 10*3/uL (ref 4.0–10.5)
nRBC: 0 % (ref 0.0–0.2)

## 2021-06-11 LAB — PROTIME-INR
INR: 1.2 (ref 0.8–1.2)
Prothrombin Time: 15.1 seconds (ref 11.4–15.2)

## 2021-06-11 LAB — LACTATE DEHYDROGENASE: LDH: 165 U/L (ref 98–192)

## 2021-06-11 MED ORDER — FUROSEMIDE 10 MG/ML IJ SOLN
40.0000 mg | Freq: Once | INTRAMUSCULAR | Status: AC
  Administered 2021-06-11: 40 mg via INTRAVENOUS
  Filled 2021-06-11: qty 4

## 2021-06-11 MED ORDER — IPRATROPIUM-ALBUTEROL 0.5-2.5 (3) MG/3ML IN SOLN
3.0000 mL | Freq: Three times a day (TID) | RESPIRATORY_TRACT | Status: DC
  Administered 2021-06-11 – 2021-06-12 (×2): 3 mL via RESPIRATORY_TRACT
  Filled 2021-06-11 (×2): qty 3

## 2021-06-11 MED ORDER — IPRATROPIUM-ALBUTEROL 0.5-2.5 (3) MG/3ML IN SOLN: 3.0000 mL | Freq: Two times a day (BID) | RESPIRATORY_TRACT | Status: DC

## 2021-06-11 NOTE — Progress Notes (Signed)
0730: Patient has been complaining of headache and pain in his buttocks -- pain rated a 5. Patient medicated. Some drainage from EVD. Pt a&oX4. 0900: Patient transferred from bed to chair. C/o headache and pain in buttocks -- medicated. No drainage from EVD.  1000: Patient talking to someone who isn't present in room. RN assessed patient. He is oriented x4. 1100: Patient c/o headache. Patient BP elevated. Patient given pain meds and BP meds. Patient nauseous. No drainage from EVD. BP improved and nausea improved with meds. HF aware of BP and came to bedside. RN paged Neurosurgery -- after discussing with him, he felt a repeat CT scan was warranted. Neuro RN came up to assess drain and felt it was clotted. She relayed this info to Neurosurgery MD & he came to bedside to assess patient. 1200: called CT. Awaiting transport for repeat scan. Patient stable as of now. Plans for new drain if needed after repeat CT scan. See MD note.

## 2021-06-11 NOTE — Progress Notes (Signed)
After returning from the CT scanner, patient appears to be in respiratory distress. He is tachypneic, O2 sats 85 on 6L [place]. Bilateral expiratory wheezes auscultated. Respiratory called for a albuterol treatment PRN and patient placed on a NRB. Patient still feels like he is working to breathe but he appears to be more comfortable having a little extra oxygen. RN will continue to monitor patient closely.

## 2021-06-11 NOTE — Progress Notes (Addendum)
Catheter appears occluded - no flow, flushed with no improvement, has had some headaches and nausea, will repeat the [facility] to evaluate for ventricular size and to correlate with symptoms, will follow his clinical progress to see if we need to replace his EVD.   Addendum: CTH reviewed, it shows some mild ventriculomegaly with temporal horns present, 4th has cleared, looks quite similar to his ventricular configuration 5d ago with a known working drain at that time, will keep the drain in and follow him clinically overnight, if he's stable tomorrow with what is essentially a 'forced clamp trial', will d/c the EVD tomorrow morning.

## 2021-06-11 NOTE — Progress Notes (Signed)
PT Cancellation Note  Patient Details Name: David [name] MRN: [id number] DOB: 11/12/1965   Cancelled Treatment:    Reason Eval/Treat Not Completed: Patient not medically ready. Pt with decline in respiratory status, placed on NRB then [place]. Will plan to follow-up another day as able once pt is more stable.   Moishe Spice, PT, DPT Acute Rehabilitation Services  Pager: [phone] Office: [place] 06/11/2021, 3:24 PM

## 2021-06-11 NOTE — Progress Notes (Signed)
PT Cancellation Note  Patient Details Name: David [name] MRN: [id number] DOB: [birth year]   Cancelled Treatment:    Reason Eval/Treat Not Completed: Other (comment). Wife present, reporting pt has been very nauseated today and wife concerned over his MAP of 100. Awaiting decision for EVD to be pulled today also. Wife requesting hold on PT at this time due to concerns listed above. Will plan to follow-up later today as time permits.   Moishe Spice, PT, DPT Acute Rehabilitation Services  Pager: [phone] Office: [place] 06/11/2021, 11:37 AM

## 2021-06-11 NOTE — Progress Notes (Addendum)
Advanced Heart Failure VAD Team Note  PCP-Cardiologist: Glori Bickers, MD   Subjective:    2/11 Presented with [place] 2/11 Underwent bedside ventriculostomy wit NSU 2/12 Repeat head CT with resolution of hydrocephalus. Persistent hematoma  EVD in place. NS considering pulling EVD pending review of CT.  Remains on losartan.  MAPs 70s-90s  Scr 1.71 -> 2.2->1.8 ->1.35  No dyspnea. Headache resolved. Only complaint is back and buttock pain  INR 1.2.  LVAD INTERROGATION:  HeartMate 3 LVAD:   Flow 4.9 liters/min, speed 5400, power 4.2, PI 3.8. No alarms. VAD interrogated personally. Parameters stable.  Objective:    Vital Signs:   Temp:  [98.1 F (36.7 C)-98.3 F (36.8 C)] 98.1 F (36.7 C) (02/17 0704) Pulse Rate:  [71-190] 71 (02/17 0817) Resp:  [11-25] 23 (02/17 0900) BP: (79-111)/(59-91) 106/76 (02/17 0900) SpO2:  [82 %-99 %] 98 % (02/17 0817) Weight:  [86.3 kg] 86.3 kg (02/17 0500) Last BM Date : 06/10/21 Mean arterial Pressure 70s-80s  Intake/Output:   Intake/Output Summary (Last 24 hours) at 06/11/2021 0936 Last data filed at 06/11/2021 0800 Gross per 24 hour  Intake 1451.97 ml  Output 1573 ml  Net -121.03 ml     Physical Exam    Physical Exam: GENERAL: No distress. Sitting up in bed. HEENT: + EVD NECK: Supple, JVP 7-8 cm.  2+ bilaterally, no bruits.   CARDIAC:  Mechanical heart sounds with LVAD hum present.  LUNGS:  Clear to auscultation bilaterally.  ABDOMEN:  Soft, round, + mildly distended, positive bowel sounds x4.     LVAD exit site: well-healed and incorporated.  Dressing dry and intact.  No erythema or drainage.  Stabilization device present and accurately applied.  Driveline dressing is being changed daily per sterile technique. EXTREMITIES:  Warm and dry, no cyanosis, clubbing, rash or edema  NEUROLOGIC:  Alert and oriented x 4.  Gait steady.  No aphasia.  No dysarthria.  Affect pleasant.      Telemetry   NSR 70s-80s (personally  reviewed)   Labs   Basic Metabolic Panel: Recent Labs  Lab 06/07/21 0056 06/08/21 0429 06/09/21 0359 06/10/21 0215 06/11/21 0116  NA 137 136 133* 132* 131*  K 4.1 4.0 4.3 4.0 4.6  CL 105 101 100 97* 97*  CO2 23 25 24 26 27   GLUCOSE 111* 96 110* 114* 106*  BUN 20 19 25* 23* 17  CREATININE 1.57* 1.71* 2.18* 1.80* 1.35*  CALCIUM 9.2 9.1 8.5* 8.5* 8.6*  MG  --   --  1.9 1.9  --     Liver Function Tests: Recent Labs  Lab 06/05/21 0636  AST 29  ALT 29  ALKPHOS 114  BILITOT 0.7  PROT 7.6  ALBUMIN 3.3*   No results for input(s): LIPASE, AMYLASE in the last 168 hours. Recent Labs  Lab 06/05/21 0646  AMMONIA 30    CBC: Recent Labs  Lab 06/05/21 0636 06/06/21 0044 06/07/21 0056 06/08/21 0429 06/09/21 0359 06/10/21 0215 06/11/21 0116  WBC 11.4*   < > 14.8* 10.9* 10.0 11.0* 10.1  NEUTROABS 10.1*  --   --   --   --   --   --   HGB 16.0   < > 15.4 14.7 14.1 13.5 13.3  HCT 49.5   < > 47.5 46.2 43.5 42.3 41.8  MCV 83.1   < > 82.8 83.4 82.9 84.3 83.8  PLT 185   < > 185 169 164 176 170   < > = values in this  interval not displayed.    INR: Recent Labs  Lab 06/07/21 0056 06/08/21 0429 06/09/21 0359 06/10/21 0215 06/11/21 0116  INR 1.0 1.2 1.2 1.2 1.2    Other results:    Imaging   CT HEAD WO CONTRAST (5MM)  Result Date: 06/11/2021 CLINICAL DATA:  56 year old male with altered mental status presenting with acute hemorrhage in the right deep gray matter, intraventricular extension. Status post ventriculostomy. EXAM: CT HEAD WITHOUT CONTRAST TECHNIQUE: Contiguous axial images were obtained from the base of the skull through the vertex without intravenous contrast. RADIATION DOSE REDUCTION: This exam was performed according to the departmental dose-optimization program which includes automated exposure control, adjustment of the mA and/or kV according to patient size and/or use of iterative reconstruction technique. COMPARISON:  Head CTs 06/05/2021. FINDINGS:  Brain: Round, lobulated hyperdense hemorrhage centered at the right caudothalamic groove and anterior septum pellucidum. Intra-axial component estimated at 25 x 33 by 29 mm (AP by transverse by CC), 12 mL and not significantly changed since 06/06/2011. Mild regional edema. Basilar cisterns are patent. No significant midline shift. Right frontal approach ventriculostomy appears to communicate with the right lateral ventricle on coronal image 34. Stable ventricle size and configuration., with resolved temporal and occipital horn enlargement since presentation. Small volume of IVH now, decreased since 06/05/2021. The 4th ventricle has largely cleared. Stable gray-white matter differentiation elsewhere, including widespread patchy bilateral white matter hypodensity and developing versus chronic cortical encephalomalacia in left middle temporal gyrus on coronal image 25. No new intracranial hemorrhage or acute cortically based infarct identified. Vascular: Calcified atherosclerosis at the skull base. Skull: Stable right frontal burr hole. No acute osseous abnormality identified. Sinuses/Orbits: Visualized paranasal sinuses and mastoids are stable and well aerated. Other: EVD in place.  No acute orbit or scalp soft tissue finding. IMPRESSION: 1. Stable right deep gray matter hemorrhage (12 mL) since 06/06/2011. Mild regional edema, no significant intracranial mass effect. 2. Stable right frontal approach EVD. Interval decreased IVH, and satisfactory ventricle size. 3. No new intracranial abnormality. 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 Electronically Signed   By: Genevie Ann M.D.   On: 06/11/2021 05:30     Medications:     Scheduled Medications:  amiodarone  200 mg Oral Daily   atorvastatin  40 mg Oral Daily   Chlorhexidine Gluconate Cloth  6 each Topical Q0600   docusate sodium  200 mg Oral Daily   gabapentin  300 mg Oral BID    ipratropium-albuterol  3 mL Nebulization TID   losartan  50 mg Oral Daily   magnesium oxide  400 mg Oral BID   mouth rinse  15 mL Mouth Rinse BID   pantoprazole  40 mg Oral Daily   polyethylene glycol  17 g Oral Daily   traZODone  50 mg Oral QHS    Infusions:   ceFAZolin (ANCEF) IV 100 mL/hr at 06/11/21 0700    PRN Medications: acetaminophen, albuterol, hydrALAZINE, ipratropium, ondansetron (ZOFRAN) IV, oxyCODONE, polyethylene glycol, traMADol    Assessment/Plan:    1. Intracranial hemorrhage - seen by NSU - CT brain with intracerebral and intraventricular hemorrhage with obstructive hydrocephalus. INR 2.4 - AC reversed with Kcentra/vit k in ER - Underwent bedside ventriculostomy on 2/11 - EVD remains in place. Awaiting CT head to decide on removal. - Appreciate NSU input - INR 1.2 today - With HM-3 device ok to continue to hold [facility] for at least a few more days (or longer) without  too much risk. Will start warfarin (no heparin) once drain out   2. Chronic systolic CHF with HM III LVAD implant 04/20/17:  - Has done well with VAD support. - NYHA I  - Appears slightly volume up. May need PRN lasix.  - He has been seen at [facility] for transplant referral but has decided not to pursue VAD. No change   3. Paroxysmal AFL with RVR - had DC-CV on 10/17/18 with subsequent short episodes of AF after but has been mostly in NSR on low-dose amio (100 daily) - Had recurrent AF in 10/22. Amio increased to 200 bid and converted.  - Now in NSR. Continue amio 200 daily - No change   4. h/o UGI AVM bleed - s/p APC x 4 lesions in 2/19 - No recurrent bleeding  - Continue PPI - Off ASA due to GIB.  - Hgb stable at 13.3   5. AKI on CKD Stage IIIa:  - Creatinine baseline 1.3 - 1.7  - Creatinine 1.71> 2.18>1.8>1.35 - Suspect ATN from hypotension.   - Keep MAP 70-90. Avoid hypotension   5. VAD management - VAD interrogated personally. Parameters stable. - LDH has been stable  - INR goal  2.0-2.5 Off ASA due to GIB 2/19.  - INR 2.4  -> now reversed due to [place]. INR 1.2 - MAPs labile see discussion below - DL site looks good   6. HTN - goal MAP 70-80 with ICH  - losartan increased 2/14 with resultant hypotension and AKI - Hydralazine stopped - follow closely. Keep MAPs 70-90    6. CAD:  s/p PCI RCA/PLOM with DES x2 and DES to mild LAD 1/18 - No angina - Continue statin. Off ASA due to GIB   7. Hypomagnesemia. -  Mag supplemented yesterday -  Check today  8. Back and buttock pain - PRN oxycodone   Length of Stay: 6  FINCH, LINDSAY N, PA-C 06/11/2021, 9:36 AM  VAD Team --- VAD ISSUES ONLY--- Pager [phone] (7am - 7am)  Advanced Heart Failure Team  Pager [phone] (M-F; 7a - 5p)  Please contact [facility] for night-coverage after hours (5p -7a ) and weekends on [email or web address]  Patient seen and examined with the above-signed Advanced Practice Provider and/or Housestaff. I personally reviewed laboratory data, imaging studies and relevant notes. I independently examined the patient and formulated the important aspects of the plan. I have edited the note to reflect any of my changes or salient points. I have personally discussed the plan with the patient and/or family.  Ventriculostomy drains remains in place. HA improved with pain meds. MAPs now stable 70-90. AKI resolved  Repeat brain CT today with stable hemorrhage and resolution of hydrocephalus  General:  NAD.  HEENT: normal  + drain in place Neck: supple. JVP 9-10  Carotids 2+ bilat; no bruits. No lymphadenopathy or thryomegaly appreciated. Cor: LVAD hum.  Lungs: Clear. Abdomen: obese soft, nontender, non-distended. No hepatosplenomegaly. No bruits or masses. Good bowel sounds. Driveline site clean. Anchor in place.  Extremities: no cyanosis, clubbing, rash. Warm tr-1+ edema  Neuro: alert & oriented x 3. No focal deficits. Moves all 4 without problem   HA improved. CT reviewed personally. Await NSU  decision regarding pulling drain. Once drain out can start warfarin (no heparin). Keep MAPs 70-90. Volume status elevated. Give 1 dose iv lasix VAD interrogated personally. Parameters stable.  Glori Bickers, MD  11:00 AM

## 2021-06-11 NOTE — Progress Notes (Signed)
°  Patient more lethargic this afternoon.   Ventriculostomy rain felt to be occluded. Seen by NSU and had repeat CT which showed new area of hemorrhages and slightly increased ventricular dilation.   This evening patient lethargic but awakens and responds to commands, HA worse tonight.   Ventriculostomy drain now clamped.   D/w NSU. Plan to follow clinically, If exam deteriorates will place new drain.   D/w family at bedside.   Appreciate NSU care.    CRITICAL CARE Performed by: Glori Bickers  Total critical care time: 35 minutes  Critical care time was exclusive of separately billable procedures and treating other patients.  Critical care was necessary to treat or prevent imminent or life-threatening deterioration.  Critical care was time spent personally by me (independent of midlevel providers or residents) on the following activities: development of treatment plan with patient and/or surrogate as well as nursing, discussions with consultants, evaluation of patient's response to treatment, examination of patient, obtaining history from patient or surrogate, ordering and performing treatments and interventions, ordering and review of laboratory studies, ordering and review of radiographic studies, pulse oximetry and re-evaluation of patient's condition.   Glori Bickers, MD  10:41 PM

## 2021-06-11 NOTE — Progress Notes (Signed)
OT Cancellation Note  Patient Details Name: David [name] MRN: [id number] DOB: [birth year]   Cancelled Treatment:    Reason Eval/Treat Not Completed: Medical issues which prohibited therapy.  Increased WOB and respiratory distress.  Will reattempt.  Nilsa Nutting., OTR/L Acute Rehabilitation Services Pager [phone] Office [phone]   [name] M 06/11/2021, 3:56 PM

## 2021-06-11 NOTE — Progress Notes (Signed)
LVAD Coordinator Rounding Note:  Admitted 06/05/21 due to [place] with hydrocephalus to Dr Bensimhon's service. Ventriculostomy placed at bedside by Dr Arnoldo Morale.   HM III LVAD implanted on 04/20/17 by Dr Cyndia Bent under destination therapy criteria.  Pt in bed this morning awake. He is alert and oriented x 3. At times his responses are delayed, which is not his baseline. Pt had neuro changes last night and neurosurgery was paged by the bedside nurse. RN had to decrease cmH20 from 30 to 20. CT of head was obtained around 0445 this morning.  Remains on losartan.  MAPs 70-102.   Continues with mild HA. No focal neuro deficits. Ventriculostomy still in place.    Currently on Ancef 1 gram IV every 8 hours with drain in place per neurosurgery. WBC 10 today.    INR 1.2 today. Not on anticoagulation for now. Timing of restart per Dr Haroldine Laws and neurosurgery.   Vital signs: Temp: 98.1 HR: 79 Doppler Pressure: 80 Automatic BP: 101/90 (95) O2 Sat: 98% on 3L/[place] Wt:182.3>186>186.5>189.6>190.2 lbs    LVAD interrogation reveals:  Speed: 5400 Flow: 4.7 Power:  4.1 PI: 4  Alarms: none Events: none Hematocrit: 42  Fixed speed: 5400 Low speed limit: 5100  Drive Line: CDI. Drive line anchor re-applied. Weekly dressing changes per bedside RN or pt's wife. Next dressing change due: 06/14/21.  Labs:  LDH trend: 166>176>157>162>165  INR trend: 1.0>1.2  WBC: 14.8>10.9>10>11>10.1  Anticoagulation Plan: -INR Goal: 2 - 2.5  (anticoagulation on hold currently due to [place]) -ASA Dose: none  Device: - Medtronic BiV -Therapies: on- VF 200, VT 167  AT 171 monitored - Last check: 04/29/21  Arrythmias: Currently NSR (history of afib/flutter). Taking Amiodarone 200 mg daily.   Adverse Events on VAD: - Readmitted 2/19 hgb 3.8. INR 2.9.transfused 4u pRBCs. 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 Call VAD coordinator for any VAD equipment or drive line issues 2. Weekly drive line dressing change per bedside RN or pt's wife  Tanda Rockers RN [place] Coordinator  Office: [phone]  24/7 Pager: [phone]

## 2021-06-12 ENCOUNTER — Inpatient Hospital Stay (HOSPITAL_COMMUNITY): Payer: Medicare HMO

## 2021-06-12 ENCOUNTER — Encounter (HOSPITAL_COMMUNITY): Payer: Self-pay | Admitting: Internal Medicine

## 2021-06-12 DIAGNOSIS — I629 Nontraumatic intracranial hemorrhage, unspecified: Secondary | ICD-10-CM | POA: Diagnosis not present

## 2021-06-12 DIAGNOSIS — I5043 Acute on chronic combined systolic (congestive) and diastolic (congestive) heart failure: Secondary | ICD-10-CM

## 2021-06-12 LAB — SODIUM: Sodium: 134 mmol/L — ABNORMAL LOW (ref 135–145)

## 2021-06-12 LAB — BASIC METABOLIC PANEL
Anion gap: 10 (ref 5–15)
Anion gap: 10 (ref 5–15)
BUN: 23 mg/dL — ABNORMAL HIGH (ref 6–20)
BUN: 27 mg/dL — ABNORMAL HIGH (ref 6–20)
CO2: 26 mmol/L (ref 22–32)
CO2: 27 mmol/L (ref 22–32)
Calcium: 8.8 mg/dL — ABNORMAL LOW (ref 8.9–10.3)
Calcium: 9.2 mg/dL (ref 8.9–10.3)
Chloride: 90 mmol/L — ABNORMAL LOW (ref 98–111)
Chloride: 92 mmol/L — ABNORMAL LOW (ref 98–111)
Creatinine, Ser: 1.48 mg/dL — ABNORMAL HIGH (ref 0.61–1.24)
Creatinine, Ser: 1.56 mg/dL — ABNORMAL HIGH (ref 0.61–1.24)
GFR, Estimated: 55 mL/min — ABNORMAL LOW (ref >60)
GFR, Estimated: 51 mL/min — ABNORMAL LOW (ref >60)
Glucose, Bld: 129 mg/dL — ABNORMAL HIGH (ref 70–99)
Glucose, Bld: 117 mg/dL — ABNORMAL HIGH (ref 70–99)
Potassium: 5.1 mmol/L (ref 3.5–5.1)
Potassium: 4.8 mmol/L (ref 3.5–5.1)
Sodium: 126 mmol/L — ABNORMAL LOW (ref 135–145)
Sodium: 129 mmol/L — ABNORMAL LOW (ref 135–145)

## 2021-06-12 LAB — HEPATIC FUNCTION PANEL
ALT: 13 U/L (ref 0–44)
AST: 24 U/L (ref 15–41)
Albumin: 2.8 g/dL — ABNORMAL LOW (ref 3.5–5.0)
Alkaline Phosphatase: 85 U/L (ref 38–126)
Bilirubin, Direct: 0.1 mg/dL (ref 0.0–0.2)
Indirect Bilirubin: 0.5 mg/dL (ref 0.3–0.9)
Total Bilirubin: 0.6 mg/dL (ref 0.3–1.2)
Total Protein: 7.3 g/dL (ref 6.5–8.1)

## 2021-06-12 LAB — PROTIME-INR
INR: 1.1 (ref 0.8–1.2)
Prothrombin Time: 14.3 seconds (ref 11.4–15.2)

## 2021-06-12 LAB — CBC
HCT: 43 % (ref 39.0–52.0)
Hemoglobin: 13.6 g/dL (ref 13.0–17.0)
MCH: 26.3 pg (ref 26.0–34.0)
MCHC: 31.6 g/dL (ref 30.0–36.0)
MCV: 83.2 fL (ref 80.0–100.0)
Platelets: 229 10*3/uL (ref 150–400)
RBC: 5.17 MIL/uL (ref 4.22–5.81)
RDW: 14.4 % (ref 11.5–15.5)
WBC: 13.7 10*3/uL — ABNORMAL HIGH (ref 4.0–10.5)
nRBC: 0 % (ref 0.0–0.2)

## 2021-06-12 LAB — LACTATE DEHYDROGENASE: LDH: 190 U/L (ref 98–192)

## 2021-06-12 MED ORDER — TOLVAPTAN 15 MG PO TABS
15.0000 mg | ORAL_TABLET | Freq: Once | ORAL | Status: AC
  Administered 2021-06-12: 15 mg via ORAL
  Filled 2021-06-12: qty 1

## 2021-06-12 MED ORDER — FUROSEMIDE 10 MG/ML IJ SOLN
40.0000 mg | Freq: Once | INTRAMUSCULAR | Status: AC
  Administered 2021-06-12: 40 mg via INTRAVENOUS
  Filled 2021-06-12: qty 4

## 2021-06-12 MED ORDER — IPRATROPIUM-ALBUTEROL 0.5-2.5 (3) MG/3ML IN SOLN: 3.0000 mL | RESPIRATORY_TRACT | Status: DC | PRN

## 2021-06-12 MED ORDER — SODIUM CHLORIDE 0.9 % IV BOLUS
250.0000 mL | Freq: Once | INTRAVENOUS | Status: AC
  Administered 2021-06-12: 250 mL via INTRAVENOUS

## 2021-06-12 MED ORDER — LOSARTAN POTASSIUM 25 MG PO TABS
25.0000 mg | ORAL_TABLET | Freq: Every day | ORAL | Status: DC
  Administered 2021-06-12 – 2021-06-28 (×17): 25 mg via ORAL
  Filled 2021-06-12 (×17): qty 1

## 2021-06-12 NOTE — Progress Notes (Signed)
PT Cancellation Note  Patient Details Name: David [name] MRN: [id number] DOB: 1966/03/18   Cancelled Treatment:    Reason Eval/Treat Not Completed: Patient at procedure or test/unavailable (Pt return for right frontal ventriculostomy. Will return at later date.)   Alvira Philips 06/12/2021, 3:42 PM Shaynah Hund M,PT Acute Rehab Services [phone] (pager)

## 2021-06-12 NOTE — Progress Notes (Addendum)
EVD replaced, immediately post-procedure he was still lethargic, but by the time I cleaned up from the procedure, he was already more awake and starting to eat a grilled cheese, good strength bilaterally. Will keep EVD at +5 and continue to follow. Pt's family updated post-procedure.

## 2021-06-12 NOTE — Progress Notes (Signed)
EVD replaced, difficult placement with recurrent clots inside the catheter, will get a CTH to evaluate for amount of residual hematoma left as well as position of the catheter tip. Pt still awakens to voice and FC x4 with good strength, only local anesthetic used for the procedure.

## 2021-06-12 NOTE — Progress Notes (Addendum)
Patient ID: David [name], male   DOB: [birth year], 56 y.o.   MRN: [id number]   Advanced Heart Failure VAD Team Note  PCP-Cardiologist: Glori Bickers, MD   Subjective:    2/11 Presented with [place] 2/11 Underwent bedside ventriculostomy wit NSU 2/12 Repeat head CT with resolution of hydrocephalus. Persistent hematoma 2/17 Repeat CT head with increased hemorrhage in right lateral ventricle, slightly increased ventricular dilation.  The ventriculostomy drain is occluded.   Occluded ventriculostomy drain.  Patient is lethargic/sleepy this morning but wakes up and has appropriate conversation.  Still has headache, no change from yesterday.   MAP 80s on losartan.   Scr 1.71 -> 2.2->1.8 ->1.35 -> 1.48.   Worsening respiratory status yesterday afternoon, CXR with mild pulmonary edema.  Currently on HFNC 0.35 but he says his breathing is improved after getting Lasix yesterday afternoon.   He remains off anticoagulation.   LVAD INTERROGATION:  HeartMate 3 LVAD:   Flow 5.1 liters/min, speed 5400, power 4.2, PI 3.0. Scatter PI events. VAD interrogated personally. Parameters stable. LDH 190  Objective:    Vital Signs:   Temp:  [98 F (36.7 C)] 98 F (36.7 C) (02/17 1130) Pulse Rate:  [71-127] 84 (02/18 0704) Resp:  [12-26] 14 (02/18 0704) BP: (80-110)/(63-98) 80/63 (02/18 0700) SpO2:  [90 %-98 %] 94 % (02/18 0704) FiO2 (%):  [40 %-50 %] 50 % (02/18 0704) Weight:  [89.6 kg] 89.6 kg (02/18 0422) Last BM Date : 06/10/21 Mean arterial Pressure 80s  Intake/Output:   Intake/Output Summary (Last 24 hours) at 06/12/2021 0801 Last data filed at 06/12/2021 0546 Gross per 24 hour  Intake 122.97 ml  Output 1205 ml  Net -1082.03 ml     Physical Exam    General: Well appearing this am. NAD.  HEENT: Normal. Neck: Supple, JVP 8-9 cm. Carotids OK.  Cardiac:  Mechanical heart sounds with LVAD hum present.  Lungs:  CTAB, normal effort.  Abdomen:  NT, ND, no HSM. No bruits or masses. +BS   LVAD exit site: Well-healed and incorporated. Dressing dry and intact. No erythema or drainage. Stabilization device present and accurately applied. Driveline dressing changed daily per sterile technique. Extremities:  Warm and dry. No cyanosis, clubbing, rash, or edema.  Neuro:  Alert & oriented x 3. Cranial nerves grossly intact. Moves all 4 extremities w/o difficulty. Sleepy but awakens with appropriate conversation.    Telemetry   NSR 70s-80s (personally reviewed)   Labs   Basic Metabolic Panel: Recent Labs  Lab 06/08/21 0429 06/09/21 0359 06/10/21 0215 06/11/21 0116 06/12/21 0104  NA 136 133* 132* 131* 126*  K 4.0 4.3 4.0 4.6 5.1  CL 101 100 97* 97* 90*  CO2 25 24 26 27 26   GLUCOSE 96 110* 114* 106* 129*  BUN 19 25* 23* 17 23*  CREATININE 1.71* 2.18* 1.80* 1.35* 1.48*  CALCIUM 9.1 8.5* 8.5* 8.6* 8.8*  MG  --  1.9 1.9  --   --     Liver Function Tests: No results for input(s): AST, ALT, ALKPHOS, BILITOT, PROT, ALBUMIN in the last 168 hours.  No results for input(s): LIPASE, AMYLASE in the last 168 hours. No results for input(s): AMMONIA in the last 168 hours.   CBC: Recent Labs  Lab 06/08/21 0429 06/09/21 0359 06/10/21 0215 06/11/21 0116 06/12/21 0104  WBC 10.9* 10.0 11.0* 10.1 13.7*  HGB 14.7 14.1 13.5 13.3 13.6  HCT 46.2 43.5 42.3 41.8 43.0  MCV 83.4 82.9 84.3 83.8 83.2  PLT 169 164  176 170 229    INR: Recent Labs  Lab 06/08/21 0429 06/09/21 0359 06/10/21 0215 06/11/21 0116 06/12/21 0104  INR 1.2 1.2 1.2 1.2 1.1    Other results:    Imaging   CT HEAD WO CONTRAST (5MM)  Result Date: 06/11/2021 CLINICAL DATA:  Hydrocephalus. EXAM: CT HEAD WITHOUT CONTRAST TECHNIQUE: Contiguous axial images were obtained from the base of the skull through the vertex without intravenous contrast. RADIATION DOSE REDUCTION: This exam was performed according to the departmental dose-optimization program which includes automated exposure control, adjustment of  the mA and/or kV according to patient size and/or use of iterative reconstruction technique. COMPARISON:  Head CT 06/11/2021 at 4:49 a.m. FINDINGS: Brain: The intra-axial component of the hemorrhage centered in the right caudothalamic groove region has not significantly changed in size from today's earlier CT, measuring 3.2 cm in transverse diameter. However, there is increased hemorrhage in the posterior body of the right lateral ventricle which surrounds the tip of the ventriculostomy catheter, and small volume hemorrhage in the atrium and temporal and occipital horns of the right lateral ventricle has also increased. There is also a new small amount of hemorrhage along the catheter in the right frontal lobe. The lateral ventricles are slightly larger than on the earlier CT. There is no extra-axial fluid collection. The basilar cisterns are patent, and there is no significant midline shift. No acute infarct is identified. 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 Patchy hypodensities in the cerebral white matter bilaterally are unchanged and nonspecific but compatible with moderate chronic small vessel ischemic disease. Vascular: Calcified atherosclerosis at the skull base. No hyperdense vessel. Skull: Right frontal burr hole. Sinuses/Orbits: Mild posterior left ethmoid air cell mucosal thickening. Clear mastoid air cells. Unremarkable orbits. Other: None. IMPRESSION: 1. Increased hemorrhage in the right lateral ventricle which surrounds the tip of the ventriculostomy catheter with slightly dilatation of both lateral ventricles. 2. Unchanged intra-axial component of the right caudothalamic groove hemorrhage. Electronically Signed   By: Logan Bores M.D.   On: 06/11/2021 13:57   CT HEAD WO CONTRAST (5MM)  Result Date: 06/11/2021 CLINICAL DATA:  56 year old male with altered mental status presenting with acute hemorrhage in the right deep gray matter, intraventricular extension. Status post  ventriculostomy. EXAM: CT HEAD WITHOUT CONTRAST TECHNIQUE: Contiguous axial images were obtained from the base of the skull through the vertex without intravenous contrast. RADIATION DOSE REDUCTION: This exam was performed according to the departmental dose-optimization program which includes automated exposure control, adjustment of the mA and/or kV according to patient size and/or use of iterative reconstruction technique. COMPARISON:  Head CTs 06/05/2021. FINDINGS: Brain: Round, lobulated hyperdense hemorrhage centered at the right caudothalamic groove and anterior septum pellucidum. Intra-axial component estimated at 25 x 33 by 29 mm (AP by transverse by CC), 12 mL and not significantly changed since 06/06/2011. Mild regional edema. Basilar cisterns are patent. No significant midline shift. Right frontal approach ventriculostomy appears to communicate with the right lateral ventricle on coronal image 34. Stable ventricle size and configuration., with resolved temporal and occipital horn enlargement since presentation. Small volume of IVH now, decreased since 06/05/2021. The 4th ventricle has largely cleared. Stable gray-white matter differentiation elsewhere, including widespread patchy bilateral white matter hypodensity and developing versus chronic cortical encephalomalacia in left middle temporal gyrus on coronal image 25. No new intracranial hemorrhage or acute cortically based infarct identified. Vascular: Calcified atherosclerosis at the skull base. Skull: Stable right frontal burr hole. No acute osseous  abnormality identified. Sinuses/Orbits: Visualized paranasal sinuses and mastoids are stable and well aerated. Other: EVD in place.  No acute orbit or scalp soft tissue finding. IMPRESSION: 1. Stable right deep gray matter hemorrhage (12 mL) since 06/06/2011. Mild regional edema, no significant intracranial mass effect. 2. Stable right frontal approach EVD. Interval decreased IVH, and satisfactory  ventricle size. 3. No new intracranial abnormality. 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 Electronically Signed   By: Genevie Ann M.D.   On: 06/11/2021 05:30   DG CHEST PORT 1 VIEW  Result Date: 06/11/2021 CLINICAL DATA:  Respiratory failure.  Shortness of breath. EXAM: PORTABLE CHEST 1 VIEW COMPARISON:  Chest radiographs 06/24/2019 FINDINGS: Status post median sternotomy. Left chest wall cardiac AICD with leads again overlying the right atrium and right ventricle. LVAD is noted. Mildly decreased lung volumes compared to prior. Cardiac silhouette is again moderately enlarged. Mediastinal contours are within normal limits. Mild bilateral interstitial thickening, possibly accentuated due to the low lung volumes compared to prior. No definite pleural effusion. No pneumothorax. No acute skeletal abnormality. IMPRESSION: Decreased lung volumes compared to prior. Mild bilateral interstitial thickening is mildly increased. This may reflect mild interstitial pulmonary edema or just the decreased lung volumes. Electronically Signed   By: Yvonne Kendall M.D.   On: 06/11/2021 14:48     Medications:     Scheduled Medications:  amiodarone  200 mg Oral Daily   atorvastatin  40 mg Oral Daily   Chlorhexidine Gluconate Cloth  6 each Topical Q0600   docusate sodium  200 mg Oral Daily   furosemide  40 mg Intravenous Once   gabapentin  300 mg Oral BID   ipratropium-albuterol  3 mL Nebulization TID   losartan  50 mg Oral Daily   magnesium oxide  400 mg Oral BID   mouth rinse  15 mL Mouth Rinse BID   pantoprazole  40 mg Oral Daily   polyethylene glycol  17 g Oral Daily   tolvaptan  15 mg Oral Once   traZODone  50 mg Oral QHS    Infusions:   ceFAZolin (ANCEF) IV 1 g (06/12/21 0545)    PRN Medications: acetaminophen, albuterol, hydrALAZINE, ipratropium, ondansetron (ZOFRAN) IV, oxyCODONE, polyethylene glycol,  traMADol    Assessment/Plan:    1. Intracranial hemorrhage - seen by NSU - CT brain with intracerebral and intraventricular hemorrhage with obstructive hydrocephalus. INR 2.4 - AC reversed with Kcentra/vit k in ER - Underwent bedside ventriculostomy on 2/11 - EVD remains in place but is now occluded. CT head 2/17 with increased hemorrhage in right lateral ventricle, slightly increased ventricular dilation. - Patient has stable to improved headache, drowsy this morning but awakens and responds appropriately.  Seems unchanged compared to notes from last night.  - Await neurosurgery input this morning => can the ventriculostomy be removed or does it need to be replaced?  - With HM-3 device ok to continue to hold [facility] for at least a few more days (or longer) without too much risk. Will start warfarin (no heparin) once drain out   2. Chronic systolic CHF with HM III LVAD implant 04/20/17:  - Has done well with VAD support. - He has been seen at [facility] for transplant referral but has decided not to pursue transplant. No change - New oxygen requirement with pulmonary edema on 2/17 CXR.  - Looks mildly volume overloaded on exam today.  - Think we can wean oxygen this morning.  -  Will give Lasix 40 mg IV x 1 and tolvaptan 15 mg x 1 with Na down to 126.    3. Paroxysmal AFL with RVR - had DC-CV on 10/17/18 with subsequent short episodes of AF after but has been mostly in NSR on low-dose amio (100 daily) - Had recurrent AF in 10/22. Amio increased to 200 bid and converted.  - Now in NSR. Continue amio 200 daily - No change   4. h/o UGI AVM bleed - s/p APC x 4 lesions in 2/19 - No recurrent bleeding  - Continue PPI - Off ASA due to GIB.  - Hgb stable at 13.6   5. AKI on CKD Stage IIIa:  - Creatinine baseline 1.3 - 1.7  - Creatinine 1.71> 2.18>1.8>1.35>1.48 - Suspect ATN from hypotension, MAP now stable. - With mild rise in creatinine, decrease losartan to 25 mg daily.  - Watch creatinine  with diuresis.    5. VAD management - VAD interrogated personally. Parameters stable. - LDH has been stable  - INR goal 2.0-2.5 Off ASA due to GIB 2/19.  - INR 2.4  -> now reversed due to [place]. - MAP stable 70s-80s.  - DL site looks good   6. HTN - goal MAP 70-90 with ICH  - losartan increased 2/14 with resultant hypotension and AKI - Hydralazine stopped - With rise in creatinine, MAP 70-80, will decrease losartan to 25 mg daily.     7. CAD:  s/p PCI RCA/PLOM with DES x2 and DES to mild LAD 1/18 - No angina - Continue statin. Off ASA due to GIB   8. Hypomagnesemia. -  supplement as needed.   9. Back and buttock pain - PRN oxycodone  10. Hyponatremia - Na 126 today.   - With hypervolemia/volume overload, will give dose of tolvaptan 15 mg x 1.    Length of Stay: [place], MD 06/12/2021, 8:01 AM  VAD Team --- VAD ISSUES ONLY--- Pager [phone] (7am - 7am)  Advanced Heart Failure Team  Pager [phone] (M-F; 7a - 5p)  Please contact [facility] for night-coverage after hours (5p -7a ) and weekends on [email or web address]  Patient seen and examined with the above-signed Advanced Practice Provider and/or Housestaff. I personally reviewed laboratory data, imaging studies and relevant notes. I independently examined the patient and formulated the important aspects of the plan. I have edited the note to reflect any of my changes or salient points. I have personally discussed the plan with the patient and/or family.

## 2021-06-12 NOTE — Progress Notes (Signed)
Pt MAPs noted to be in low 60s with automatic cuff readings, manual doppler confirmed MAP of 62. PI noted to be decreased from 3.1 to 1.5. Dr. Aundra Dubin called, made aware of changes as well as output from EVD and foley for shift. MD stated to give 250cc bolus NS over 1 hour. Order RBV and placed in chart.

## 2021-06-12 NOTE — Procedures (Signed)
PREOP DX: Hydrocephalus  POSTOP DX: Same  PROCEDURE: Right frontal ventriculostomy   SURGEON: Dr. Emelda Brothers, MD  ANESTHESIA: Lidocaine  EBL: Minimal  SPECIMENS: None  COMPLICATIONS: None  CONDITION: Hemodynamically stable  INDICATIONS: Mrs. David [name] is a 56 y.o. male with a history of an [place] with IVH and hydrocephalus. His EVD stopped working and he had progressive decline in mental status with continued ventriculomegaly, I therefore recommended replacement of the EVD.  PROCEDURE IN DETAIL: After consent was obtained from the patient's family, the prior insertion site was prepped and draped in the usual sterile fashion.  Scalp was then infiltrated with local anesthetic with epinephrine.  The prior skin incision was reopened and a new catheter was soft passed along the tract of the prior catheter with immediate encounter of old dark blood products. These quickly clotted the catheter, so I removed and flushed it and this happened multiple times. I therefore used the stylet to create a new trajectory with more of a frontal trajectory given that the prior ependymal entry point was more posterior and was able to get CSF flow, but again had to clear some clot from the catheter by gentle flushing after a short while. It remained patent but draining slowly. Given that it was patent, I secured the catheter at that location by tunneling it and then securing it with suture, followed by closure of the entry site. It was then connected to the distal system and a sterile dressing was applied.

## 2021-06-12 NOTE — Procedures (Signed)
PREOP DX: Hydrocephalus  POSTOP DX: Same  PROCEDURE: Replacement of right frontal ventriculostomy   SURGEON: Dr. Emelda Brothers, MD  ANESTHESIA: lidocaine  EBL: Minimal  SPECIMENS: None  COMPLICATIONS: None  CONDITION: Hemodynamically stable  INDICATIONS: Mrs. David [name] is a 56 y.o. male in whom I replaced a non-functioning ventriculostomy earlier today. There was sluggish flow, CT confirmed that the catheter was lateral to the ventricle. I discussed this with the family and the patient and explained the need for replacement.  PROCEDURE IN DETAIL: After consent was obtained from the patient's family, the region was prepped and draped in the usual sterile fashion.  Scalp was then infiltrated with local anesthetic.  The old catheter had been removed prior to draping. The prior incision was reopened, a new catheter was inserted using a stylet and a more medial trajectory than previously with return of CSF at 5cm. I soft passed it to 7cm and there was again IVH present that started to occlude the catheter but I let it drain slowly without suction until the blood worked its way out and blood-tinged CSF began to flow. The catheter was tunneled and secured the catheter, then closed the incision, applied a sterile dressing, and connected the new catheter to a distal system with continued good flow.

## 2021-06-12 NOTE — Progress Notes (Signed)
Neurosurgery Service Progress Note  Subjective: No acute events overnight, no dramatic changes but overall just more progressively lethargic   Objective: Vitals:   06/12/21 0900 06/12/21 0930 06/12/21 1000 06/12/21 1100  BP: (!) 83/62  (!) 83/72 92/78  Pulse: 81 83 82 (!) 129  Resp: 15 15 17 12   Temp:      TempSrc:      SpO2: 97% 96% 96% 96%  Weight:      Height:        Physical Exam: Somnolent - eyes open to voice but quickly falls asleep again, Ox3 with effort, FCx4  Assessment & Plan: 56 y.o. man w/ ICH / IVH / hydrocephalus s/p EVD placement, EVD clotted, rpt CTH with mild ventriculomegaly, now progressive somnolence.  -continued worsening of neurologic exam, will replace his EVD at bedside today and see if he improves  Judith Part  06/12/21 11:38 AM

## 2021-06-13 DIAGNOSIS — Z95811 Presence of heart assist device: Secondary | ICD-10-CM | POA: Diagnosis not present

## 2021-06-13 DIAGNOSIS — I629 Nontraumatic intracranial hemorrhage, unspecified: Secondary | ICD-10-CM | POA: Diagnosis not present

## 2021-06-13 LAB — BASIC METABOLIC PANEL
Anion gap: 8 (ref 5–15)
BUN: 32 mg/dL — ABNORMAL HIGH (ref 6–20)
CO2: 28 mmol/L (ref 22–32)
Calcium: 8.8 mg/dL — ABNORMAL LOW (ref 8.9–10.3)
Chloride: 95 mmol/L — ABNORMAL LOW (ref 98–111)
Creatinine, Ser: 1.52 mg/dL — ABNORMAL HIGH (ref 0.61–1.24)
GFR, Estimated: 53 mL/min — ABNORMAL LOW (ref >60)
Glucose, Bld: 115 mg/dL — ABNORMAL HIGH (ref 70–99)
Potassium: 5.1 mmol/L (ref 3.5–5.1)
Sodium: 131 mmol/L — ABNORMAL LOW (ref 135–145)

## 2021-06-13 LAB — CBC
HCT: 41.7 % (ref 39.0–52.0)
Hemoglobin: 13.2 g/dL (ref 13.0–17.0)
MCH: 26.7 pg (ref 26.0–34.0)
MCHC: 31.7 g/dL (ref 30.0–36.0)
MCV: 84.4 fL (ref 80.0–100.0)
Platelets: 204 10*3/uL (ref 150–400)
RBC: 4.94 MIL/uL (ref 4.22–5.81)
RDW: 14.5 % (ref 11.5–15.5)
WBC: 12.1 10*3/uL — ABNORMAL HIGH (ref 4.0–10.5)
nRBC: 0 % (ref 0.0–0.2)

## 2021-06-13 LAB — LACTATE DEHYDROGENASE: LDH: 205 U/L — ABNORMAL HIGH (ref 98–192)

## 2021-06-13 LAB — PROTIME-INR
INR: 1.2 (ref 0.8–1.2)
Prothrombin Time: 14.9 seconds (ref 11.4–15.2)

## 2021-06-13 NOTE — Progress Notes (Signed)
Patient ID: BOLUWATIFE FLIGHT, male   DOB: 05/17/1965, 56 y.o.   MRN: [id number] P  Advanced Heart Failure VAD Team Note  PCP-Cardiologist: Glori Bickers, MD   Subjective:    2/11 Presented with [place] 2/11 Underwent bedside ventriculostomy wit NSU 2/12 Repeat head CT with resolution of hydrocephalus. Persistent hematoma 2/17 Repeat CT head with increased hemorrhage in right lateral ventricle, slightly increased ventricular dilation.  The ventriculostomy drain is occluded.  2/18 Ventriculostomy drain replaced  Patient is more awake/alert this morning after replacement of ventriculostomy drain.  Still with headache but improved.   MAP 80s on losartan.   Scr 1.71 -> 2.2->1.8 ->1.35 -> 1.48 -> 1.52.  Got IV Lasix + tolvaptan with excellent diuresis, weight down.  Na 131 today. Oxygen down to 2L [place].   He remains off anticoagulation.   LVAD INTERROGATION:  HeartMate 3 LVAD:   Flow 4.7 liters/min, speed 5400, power 4.2, PI 4.2. 3 PI events. VAD interrogated personally. Parameters stable. LDH 190  Objective:    Vital Signs:   Temp:  [97.8 F (36.6 C)-99.2 F (37.3 C)] 97.8 F (36.6 C) (02/18 2300) Pulse Rate:  [30-178] 79 (02/19 0739) Resp:  [12-21] 15 (02/19 0739) BP: (63-120)/(28-109) 95/76 (02/19 0739) SpO2:  [81 %-99 %] 97 % (02/19 0739) FiO2 (%):  [50 %] 50 % (02/18 0800) Weight:  [85.1 kg] 85.1 kg (02/19 0600) Last BM Date : 06/10/21 Mean arterial Pressure 80s  Intake/Output:   Intake/Output Summary (Last 24 hours) at 06/13/2021 0758 Last data filed at 06/13/2021 0700 Gross per 24 hour  Intake 510.27 ml  Output 4196 ml  Net -3685.73 ml     Physical Exam    General: Well appearing this am. NAD.  HEENT: R frontal ventriculostomy Neck: Supple, JVP 7-8 cm. Carotids OK.  Cardiac:  Mechanical heart sounds with LVAD hum present.  Lungs:  CTAB, normal effort.  Abdomen:  NT, ND, no HSM. No bruits or masses. +BS  LVAD exit site: Well-healed and incorporated. Dressing dry  and intact. No erythema or drainage. Stabilization device present and accurately applied. Driveline dressing changed daily per sterile technique. Extremities:  Warm and dry. No cyanosis, clubbing, rash, or edema.  Neuro:  Alert & oriented x 3. Cranial nerves grossly intact. Moves all 4 extremities w/o difficulty. Affect pleasant      Telemetry   NSR 70s-80s (personally reviewed)   Labs   Basic Metabolic Panel: Recent Labs  Lab 06/09/21 0359 06/10/21 0215 06/11/21 0116 06/12/21 0104 06/12/21 0947 06/12/21 1747 06/13/21 0148  NA 133* 132* 131* 126* 129* 134* 131*  K 4.3 4.0 4.6 5.1 4.8  --  5.1  CL 100 97* 97* 90* 92*  --  95*  CO2 24 26 27 26 27   --  28  GLUCOSE 110* 114* 106* 129* 117*  --  115*  BUN 25* 23* 17 23* 27*  --  32*  CREATININE 2.18* 1.80* 1.35* 1.48* 1.56*  --  1.52*  CALCIUM 8.5* 8.5* 8.6* 8.8* 9.2  --  8.8*  MG 1.9 1.9  --   --   --   --   --     Liver Function Tests: Recent Labs  Lab 06/12/21 0947  AST 24  ALT 13  ALKPHOS 85  BILITOT 0.6  PROT 7.3  ALBUMIN 2.8*    No results for input(s): LIPASE, AMYLASE in the last 168 hours. No results for input(s): AMMONIA in the last 168 hours.   CBC: Recent Labs  Lab 06/09/21 0359 06/10/21 0215 06/11/21 0116 06/12/21 0104 06/13/21 0148  WBC 10.0 11.0* 10.1 13.7* 12.1*  HGB 14.1 13.5 13.3 13.6 13.2  HCT 43.5 42.3 41.8 43.0 41.7  MCV 82.9 84.3 83.8 83.2 84.4  PLT 164 176 170 229 204    INR: Recent Labs  Lab 06/09/21 0359 06/10/21 0215 06/11/21 0116 06/12/21 0104 06/13/21 0148  INR 1.2 1.2 1.2 1.1 1.2    Other results:    Imaging   CT HEAD WO CONTRAST (5MM)  Result Date: 06/12/2021 CLINICAL DATA:  Hydrocephalus EXAM: CT HEAD WITHOUT CONTRAST TECHNIQUE: Contiguous axial images were obtained from the base of the skull through the vertex without intravenous contrast. RADIATION DOSE REDUCTION: This exam was performed according to the departmental dose-optimization program which includes  automated exposure control, adjustment of the mA and/or kV according to patient size and/or use of iterative reconstruction technique. COMPARISON:  February 17 23. FINDINGS: Brain: Similar dominant focus of intraparenchymal in the right caudothalamic groove. Increased linear intraparenchymal hemorrhage in the right frontal lobe, presumably along the tract of a prior ventriculostomy catheter, now measuring up to 10 mm in thickness. Similar intraventricular extension of hemorrhage, layering in the right lateral ventricle. Right ventriculostomy catheter tip projects lateral to the right lateral ventricle in the right basal ganglia, outside of the ventricular system. Very mild interval increase in rounding of the right temporal horn with otherwise similar mildly dilated ventricles. Effacement of the suprasellar cistern. No evidence of acute large vascular territory infarct. Similar patchy hypodensities in the white matter, nonspecific but felt chronic microvascular ischemic disease. Probable old infarct in the left frontal cortex, similar. Vascular: No hyperdense vessel identified. Skull: Right frontal burr hole. Sinuses/Orbits: Clear sinuses. Other: No mastoid effusions. IMPRESSION: 1. Right ventriculostomy catheter tip now projects lateral to the right lateral ventricle in the right basal ganglia, outside of the ventricular system. 2. Increased linear hemorrhage in the right frontal lobe, presumably along the tract of a previous ventriculostomy catheter. Otherwise, similar intraparenchymal hemorrhage centered in the right caudate thalamic groove and similar intraventricular extension in the right lateral ventricle. Similar effacement of the suprasellar cistern, compatible with an element of downward herniation. 3. Very mild interval increase in rounding of the right temporal horn with otherwise similar size of the mildly dilated ventricular system, concerning for mild hydrocephalus. Findings discussed with Dr.  Zada Finders via telephone at 3:25 p.m. Electronically Signed   By: Margaretha Sheffield M.D.   On: 06/12/2021 15:27   CT HEAD WO CONTRAST (5MM)  Result Date: 06/11/2021 CLINICAL DATA:  Hydrocephalus. EXAM: CT HEAD WITHOUT CONTRAST TECHNIQUE: Contiguous axial images were obtained from the base of the skull through the vertex without intravenous contrast. RADIATION DOSE REDUCTION: This exam was performed according to the departmental dose-optimization program which includes automated exposure control, adjustment of the mA and/or kV according to patient size and/or use of iterative reconstruction technique. COMPARISON:  Head CT 06/11/2021 at 4:49 a.m. FINDINGS: Brain: The intra-axial component of the hemorrhage centered in the right caudothalamic groove region has not significantly changed in size from today's earlier CT, measuring 3.2 cm in transverse diameter. However, there is increased hemorrhage in the posterior body of the right lateral ventricle which surrounds the tip of the ventriculostomy catheter, and small volume hemorrhage in the atrium and temporal and occipital horns of the right lateral ventricle has also increased. There is also a new small amount of hemorrhage along the catheter in the right frontal lobe. The lateral ventricles are slightly larger  than on the earlier CT. There is no extra-axial fluid collection. The basilar cisterns are patent, and there is no significant midline shift. No acute infarct is identified. 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 Patchy hypodensities in the cerebral white matter bilaterally are unchanged and nonspecific but compatible with moderate chronic small vessel ischemic disease. Vascular: Calcified atherosclerosis at the skull base. No hyperdense vessel. Skull: Right frontal burr hole. Sinuses/Orbits: Mild posterior left ethmoid air cell mucosal thickening. Clear mastoid air cells. Unremarkable orbits. Other: None. IMPRESSION: 1. Increased  hemorrhage in the right lateral ventricle which surrounds the tip of the ventriculostomy catheter with slightly dilatation of both lateral ventricles. 2. Unchanged intra-axial component of the right caudothalamic groove hemorrhage. Electronically Signed   By: Logan Bores M.D.   On: 06/11/2021 13:57   DG CHEST PORT 1 VIEW  Result Date: 06/11/2021 CLINICAL DATA:  Respiratory failure.  Shortness of breath. EXAM: PORTABLE CHEST 1 VIEW COMPARISON:  Chest radiographs 06/24/2019 FINDINGS: Status post median sternotomy. Left chest wall cardiac AICD with leads again overlying the right atrium and right ventricle. LVAD is noted. Mildly decreased lung volumes compared to prior. Cardiac silhouette is again moderately enlarged. Mediastinal contours are within normal limits. Mild bilateral interstitial thickening, possibly accentuated due to the low lung volumes compared to prior. No definite pleural effusion. No pneumothorax. No acute skeletal abnormality. IMPRESSION: Decreased lung volumes compared to prior. Mild bilateral interstitial thickening is mildly increased. This may reflect mild interstitial pulmonary edema or just the decreased lung volumes. Electronically Signed   By: Yvonne Kendall M.D.   On: 06/11/2021 14:48     Medications:     Scheduled Medications:  amiodarone  200 mg Oral Daily   atorvastatin  40 mg Oral Daily   Chlorhexidine Gluconate Cloth  6 each Topical Q0600   docusate sodium  200 mg Oral Daily   gabapentin  300 mg Oral BID   losartan  25 mg Oral Daily   magnesium oxide  400 mg Oral BID   mouth rinse  15 mL Mouth Rinse BID   pantoprazole  40 mg Oral Daily   polyethylene glycol  17 g Oral Daily   traZODone  50 mg Oral QHS    Infusions:   ceFAZolin (ANCEF) IV 1 g (06/13/21 0507)    PRN Medications: acetaminophen, hydrALAZINE, ipratropium-albuterol, ondansetron (ZOFRAN) IV, oxyCODONE, polyethylene glycol, traMADol    Assessment/Plan:    1. Intracranial hemorrhage - seen by  NSU - CT brain with intracerebral and intraventricular hemorrhage with obstructive hydrocephalus. INR 2.4 - AC reversed with Kcentra/vit k in ER - Underwent bedside ventriculostomy on 2/11 - Initial ventriculostomy drain occluded. CT head 2/17 with increased hemorrhage in right lateral ventricle, slightly increased ventricular dilation.  On 2/18, ventriculostomy drain replaced.  - Patient is more alert this morning, headache improved though still present.   - With HM-3 device ok to continue to hold [facility] for at least a few more days (or longer) without too much risk. Will start warfarin (no heparin) once drain out   2. Chronic systolic CHF with HM III LVAD implant 04/20/17:  - Has done well with VAD support. - He has been seen at [facility] for transplant referral but has decided not to pursue transplant. No change - New oxygen requirement with pulmonary edema on 2/17 CXR => good diuresis on 2/18.  - Oxygen saturation down to 2L [place].  Will hold diuretics today with improved volume status.  3. Paroxysmal AFL with RVR - had DC-CV on 10/17/18 with subsequent short episodes of AF after but has been mostly in NSR on low-dose amio (100 daily) - Had recurrent AF in 10/22. Amio increased to 200 bid and converted.  - Now in NSR. Continue amio 200 daily - No change   4. h/o UGI AVM bleed - s/p APC x 4 lesions in 2/19 - No recurrent bleeding  - Continue PPI - Off ASA due to GIB.  - Hgb stable at 13.6   5. AKI on CKD Stage IIIa:  - Creatinine baseline 1.3 - 1.7  - Creatinine 1.71> 2.18>1.8>1.35>1.48>1.52 - Suspect ATN from hypotension, MAP now stable. - With mild rise in creatinine, decreased losartan to 25 mg daily.    5. VAD management - VAD interrogated personally. Parameters stable. - LDH has been stable  - INR goal 2.0-2.5 Off ASA due to GIB 2/19.  - INR 2.4  -> now reversed due to [place]. - MAP stable 70s-80s.  - DL site looks good   6. HTN - goal MAP 70-90 with ICH  - losartan increased  2/14 with resultant hypotension and AKI - Hydralazine stopped - Remains on losartan 25 mg daily.      7. CAD:  s/p PCI RCA/PLOM with DES x2 and DES to mild LAD 1/18 - No angina - Continue statin. Off ASA due to GIB   8. Hypomagnesemia. -  supplement as needed.   9. Back and buttock pain - PRN oxycodone  10. Hyponatremia - Increased to 131 with tolvaptan 2/18.  - Keep fluid restricted.     Length of Stay: [place], MD 06/13/2021, 7:58 AM  VAD Team --- VAD ISSUES ONLY--- Pager [phone] (7am - 7am)  Advanced Heart Failure Team  Pager [phone] (M-F; 7a - 5p)  Please contact [facility] for night-coverage after hours (5p -7a ) and weekends on [email or web address]  Patient seen and examined with the above-signed Advanced Practice Provider and/or Housestaff. I personally reviewed laboratory data, imaging studies and relevant notes. I independently examined the patient and formulated the important aspects of the plan. I have edited the note to reflect any of my changes or salient points. I have personally discussed the plan with the patient and/or family.

## 2021-06-13 NOTE — Plan of Care (Signed)
  Problem: Pain Managment: Goal: General experience of comfort will improve Outcome: Progressing   Problem: Nutrition: Goal: Adequate nutrition will be maintained Outcome: Progressing   

## 2021-06-13 NOTE — Progress Notes (Signed)
Neurosurgery Service Progress Note  Subjective: No acute events overnight, significantly improved after EVD was replaced - more awake/alert   Objective: Vitals:   06/13/21 0800 06/13/21 0801 06/13/21 0900 06/13/21 1012  BP: 91/75 91/75 91/74  109/86  Pulse: 75 79 77 85  Resp: 15 18 15 20   Temp:  98.3 F (36.8 C)    TempSrc:  Oral    SpO2: 97% 94% 96% 96%  Weight:      Height:        Physical Exam: Aox3, PERRL, EOMI, FS & SS, strength 5/5x4, EVD in place w/ serosang drainage  Assessment & Plan: 56 y.o. man w/ LVAD and ICH / IVH / HCP s/p EVD placement, EVD clotted off w/ return of HCP Sx, EVD replaced x2 on 2/19, improved today.   -good output, currently at +5 to keep it patent, no change in neurosurgical plan of care -will re-address timing of anticoagulation tomorrow, difficult situation, has a tract hemorrhage on the right that's new  Judith Part  06/13/21 10:25 AM

## 2021-06-14 DIAGNOSIS — I629 Nontraumatic intracranial hemorrhage, unspecified: Secondary | ICD-10-CM | POA: Diagnosis not present

## 2021-06-14 LAB — CBC
HCT: 41.5 % (ref 39.0–52.0)
Hemoglobin: 13.1 g/dL (ref 13.0–17.0)
MCH: 26.7 pg (ref 26.0–34.0)
MCHC: 31.6 g/dL (ref 30.0–36.0)
MCV: 84.5 fL (ref 80.0–100.0)
Platelets: 208 10*3/uL (ref 150–400)
RBC: 4.91 MIL/uL (ref 4.22–5.81)
RDW: 14.2 % (ref 11.5–15.5)
WBC: 11.1 10*3/uL — ABNORMAL HIGH (ref 4.0–10.5)
nRBC: 0 % (ref 0.0–0.2)

## 2021-06-14 LAB — BASIC METABOLIC PANEL
Anion gap: 11 (ref 5–15)
BUN: 25 mg/dL — ABNORMAL HIGH (ref 6–20)
CO2: 27 mmol/L (ref 22–32)
Calcium: 9.2 mg/dL (ref 8.9–10.3)
Chloride: 99 mmol/L (ref 98–111)
Creatinine, Ser: 1.28 mg/dL — ABNORMAL HIGH (ref 0.61–1.24)
GFR, Estimated: >60 mL/min (ref >60)
Glucose, Bld: 103 mg/dL — ABNORMAL HIGH (ref 70–99)
Potassium: 4.3 mmol/L (ref 3.5–5.1)
Sodium: 137 mmol/L (ref 135–145)

## 2021-06-14 LAB — PROTIME-INR
INR: 1.1 (ref 0.8–1.2)
Prothrombin Time: 13.8 seconds (ref 11.4–15.2)

## 2021-06-14 LAB — MAGNESIUM: Magnesium: 2 mg/dL (ref 1.7–2.4)

## 2021-06-14 LAB — LACTATE DEHYDROGENASE: LDH: 176 U/L (ref 98–192)

## 2021-06-14 MED ORDER — SODIUM CHLORIDE 0.9 % IV SOLN
INTRAVENOUS | Status: DC | PRN
Start: 2021-06-14 — End: 2021-06-28

## 2021-06-14 MED ORDER — LABETALOL HCL 5 MG/ML IV SOLN
INTRAVENOUS | Status: DC
Start: 2021-06-14 — End: 2021-06-14
  Filled 2021-06-14: qty 4

## 2021-06-14 NOTE — Progress Notes (Signed)
Physical Therapy Treatment Patient Details Name: David [name] MRN: [id number] DOB: 1965-08-27 Today's Date: 06/14/2021   History of Present Illness 56 y.o. male who presented 06/05/21 with worsening HA. CT showed right basal ganglia hemorrhage, intraventricular hemorrhage and obstructive hydrocephalus. S/p placement of right frontal ventriculostomy via bur hole 2/11. 2/12 Repeat head CT with resolution of hydrocephalus. PMH: ICD, HfrEF, HLD, HTN, s/p LVAD HM-III 04/20/18, CAD, CKD, MI, seizures    PT Comments    Pt received up in chair; pleasant and agreeable to participate in therapy session. Pt typically independent with managing LVAD, but requires assist for switching from wall power to batteries. Pt ambulating 160 feet with Eva walker at a min assist level. Demonstrates left lateral lean and inattention. Worked on cueing to scan towards left to locate room numbers and obstacles. D/c plan remains appropriate.     Recommendations for follow up therapy are one component of a multi-disciplinary discharge planning process, led by the attending physician.  Recommendations may be updated based on patient status, additional functional criteria and insurance authorization.  Follow Up Recommendations  Home health PT     Assistance Recommended at Discharge Frequent or constant Supervision/Assistance  Patient can return home with the following A little help with bathing/dressing/bathroom;Assistance with cooking/housework;Direct supervision/assist for financial management;Direct supervision/assist for medications management;Assist for transportation;A little help with walking and/or transfers;Help with stairs or ramp for entrance   Equipment Recommendations  None recommended by PT    Recommendations for Other Services       Precautions / Restrictions Precautions Precautions: Fall;Other (comment) Precaution Comments: LVAD (x4 year hx), EVD (clamp prior to mobilizing) Restrictions Weight  Bearing Restrictions: No     Mobility  Bed Mobility               General bed mobility comments: OOB in chair    Transfers Overall transfer level: Needs assistance Equipment used: None Transfers: Sit to/from Stand Sit to Stand: Min assist           General transfer comment: Light minA to steady    Ambulation/Gait Ambulation/Gait assistance: Min assist Gait Distance (Feet): 160 Feet Assistive device: Ethelene Hal Gait Pattern/deviations: Step-through pattern, Decreased stride length (leaning L) Gait velocity: decreased     General Gait Details: Cues for upright posture, consistent left lateral lean and unable to correct, verbal cueing for environmental and obstacle negotiation   Stairs             Wheelchair Mobility    Modified Rankin (Stroke Patients Only) Modified Rankin (Stroke Patients Only) Pre-Morbid Rankin Score: No symptoms Modified Rankin: Moderately severe disability     Balance Overall balance assessment: Needs assistance Sitting-balance support: No upper extremity supported, Feet supported Sitting balance-Leahy Scale: Fair     Standing balance support: No upper extremity supported, During functional activity Standing balance-Leahy Scale: Fair                              Cognition Arousal/Alertness: Awake/alert Behavior During Therapy: Flat affect Overall Cognitive Status: Impaired/Different from baseline Area of Impairment: Attention, Memory, Following commands, Safety/judgement, Awareness, Problem solving                 Orientation Level: Disoriented to, Time Current Attention Level: Sustained Memory: Decreased recall of precautions, Decreased short-term memory Following Commands: Follows one step commands with increased time, Follows one step commands consistently Safety/Judgement: Decreased awareness of safety, Decreased awareness of deficits Awareness: Intellectual  Problem Solving: Slow processing,  Decreased initiation, Difficulty sequencing, Requires verbal cues, Requires tactile cues General Comments: Pt stating it was 04/05/22, then stating it was January 2023. Able to recall it was February after re-orientation. Unable to switch LVAD from wall power to batteries        Exercises General Exercises - Lower Extremity Long Arc Quad: Both, Seated, 20 reps Hip Flexion/Marching: Both, 10 reps, Seated Toe Raises: Both, 20 reps, Seated Heel Raises: Both, 20 reps, Seated    General Comments        Pertinent Vitals/Pain Pain Assessment Pain Assessment: Faces Faces Pain Scale: No hurt    Home Living                          Prior Function            PT Goals (current goals can now be found in the care plan section) Acute Rehab PT Goals Patient Stated Goal: to improve Potential to Achieve Goals: Good Progress towards PT goals: Progressing toward goals    Frequency    Min 3X/week      PT Plan Current plan remains appropriate    Co-evaluation              AM-PAC PT "6 Clicks" Mobility   Outcome Measure  Help needed turning from your back to your side while in a flat bed without using bedrails?: A Little Help needed moving from lying on your back to sitting on the side of a flat bed without using bedrails?: A Little Help needed moving to and from a bed to a chair (including a wheelchair)?: A Little Help needed standing up from a chair using your arms (e.g., wheelchair or bedside chair)?: A Little Help needed to walk in hospital room?: A Little Help needed climbing 3-5 steps with a railing? : A Little 6 Click Score: 18    End of Session Equipment Utilized During Treatment: Oxygen Activity Tolerance: Patient tolerated treatment well Patient left: in chair;with call bell/phone within reach;with family/visitor present Nurse Communication: Mobility status PT Visit Diagnosis: Unsteadiness on feet (R26.81);Other abnormalities of gait and mobility  (R26.89);Difficulty in walking, not elsewhere classified (R26.2)     Time: 3762-8315 PT Time Calculation (min) (ACUTE ONLY): 40 min  Charges:  $Gait Training: 8-22 mins $Therapeutic Activity: 23-37 mins                     Wyona Almas, PT, DPT Acute Rehabilitation Services Pager [phone] Office [phone]    Deno Etienne 06/14/2021, 2:59 PM

## 2021-06-14 NOTE — Plan of Care (Signed)
  Problem: Clinical Measurements: Goal: Respiratory complications will improve Outcome: Progressing Goal: Cardiovascular complication will be avoided Outcome: Progressing   Problem: Elimination: Goal: Will not experience complications related to bowel motility Outcome: Progressing   Problem: Pain Managment: Goal: General experience of comfort will improve Outcome: Progressing   

## 2021-06-14 NOTE — Progress Notes (Signed)
Subjective: The patient is alert and pleasant.  He is in no apparent distress.  Objective: Vital signs in last 24 hours: Temp:  [97.5 F (36.4 C)-98.2 F (36.8 C)] 98.1 F (36.7 C) (02/20 0300) Pulse Rate:  [69-98] 71 (02/20 1000) Resp:  [13-26] 16 (02/20 1000) BP: (83-136)/(56-107) 101/88 (02/20 1000) SpO2:  [88 %-98 %] 88 % (02/20 1000) Weight:  [78.2 kg] 78.2 kg (02/20 0200) Estimated body mass index is 28.69 kg/m as calculated from the following:   Height as of this encounter: 5\' 5"  (1.651 m).   Weight as of this encounter: 78.2 kg.   Intake/Output from previous day: 02/19 0701 - 02/20 0700 In: 1071.5 [P.O.:1020; IV Piggyback:51.5] Out: 2808 [Urine:2705; Drains:103] Intake/Output this shift: Total I/O In: -  Out: 256 [Urine:240; Drains:16]  Physical exam the patient is alert and oriented.  His speech and strength is normal.  His ventriculostomy is patent and draining bloody spinal fluid.  Lab Results: Recent Labs    06/13/21 0148 06/14/21 0135  WBC 12.1* 11.1*  HGB 13.2 13.1  HCT 41.7 41.5  PLT 204 208   BMET Recent Labs    06/13/21 0148 06/14/21 0135  NA 131* 137  K 5.1 4.3  CL 95* 99  CO2 28 27  GLUCOSE 115* 103*  BUN 32* 25*  CREATININE 1.52* 1.28*  CALCIUM 8.8* 9.2    Studies/Results: CT HEAD WO CONTRAST (5MM)  Result Date: 06/12/2021 CLINICAL DATA:  Hydrocephalus EXAM: CT HEAD WITHOUT CONTRAST TECHNIQUE: Contiguous axial images were obtained from the base of the skull through the vertex without intravenous contrast. RADIATION DOSE REDUCTION: This exam was performed according to the departmental dose-optimization program which includes automated exposure control, adjustment of the mA and/or kV according to patient size and/or use of iterative reconstruction technique. COMPARISON:  February 17 23. FINDINGS: Brain: Similar dominant focus of intraparenchymal in the right caudothalamic groove. Increased linear intraparenchymal hemorrhage in the right  frontal lobe, presumably along the tract of a prior ventriculostomy catheter, now measuring up to 10 mm in thickness. Similar intraventricular extension of hemorrhage, layering in the right lateral ventricle. Right ventriculostomy catheter tip projects lateral to the right lateral ventricle in the right basal ganglia, outside of the ventricular system. Very mild interval increase in rounding of the right temporal horn with otherwise similar mildly dilated ventricles. Effacement of the suprasellar cistern. No evidence of acute large vascular territory infarct. Similar patchy hypodensities in the white matter, nonspecific but felt chronic microvascular ischemic disease. Probable old infarct in the left frontal cortex, similar. Vascular: No hyperdense vessel identified. Skull: Right frontal burr hole. Sinuses/Orbits: Clear sinuses. Other: No mastoid effusions. IMPRESSION: 1. Right ventriculostomy catheter tip now projects lateral to the right lateral ventricle in the right basal ganglia, outside of the ventricular system. 2. Increased linear hemorrhage in the right frontal lobe, presumably along the tract of a previous ventriculostomy catheter. Otherwise, similar intraparenchymal hemorrhage centered in the right caudate thalamic groove and similar intraventricular extension in the right lateral ventricle. Similar effacement of the suprasellar cistern, compatible with an element of downward herniation. 3. Very mild interval increase in rounding of the right temporal horn with otherwise similar size of the mildly dilated ventricular system, concerning for mild hydrocephalus. Findings discussed with Dr. Zada Finders via telephone at 3:25 p.m. Electronically Signed   By: Margaretha Sheffield M.D.   On: 06/12/2021 15:27    Assessment/Plan: Intracerebral hemorrhage, hydrocephalus: The patient's ventriculostomy is functioning well.  Clinically he is doing well.  We  will continue to ventriculostomy at least for a few more days.   LOS: 9 days     [name] 06/14/2021, 11:47 AM

## 2021-06-14 NOTE — Progress Notes (Signed)
Occupational Therapy Treatment Patient Details Name: David [name] MRN: [id number] DOB: 04/22/1966 Today's Date: 06/14/2021   History of present illness 56 y.o. male who presented 06/05/21 with worsening HA. CT showed right basal ganglia hemorrhage, intraventricular hemorrhage and obstructive hydrocephalus. S/p placement of right frontal ventriculostomy via bur hole 2/11. 2/12 Repeat head CT with resolution of hydrocephalus. PMH: ICD, HfrEF, HLD, HTN, s/p LVAD HM-III 04/20/18, CAD, CKD, MI, seizures   OT comments  Pt continues to present with decreased cognition and poor standing balance. Pt performing oral care while standing at sink; Min Guard initially and then Mod A for left lateral lean. Cues and increased time for attention to L and then sequence. Pt's wife, Joseph Art, demonstrating understanding to change from LVAD batteries and AC power. Update dc to home with HHOT due to decreased balance. Will continue to follow acutely as admitted.    Recommendations for follow up therapy are one component of a multi-disciplinary discharge planning process, led by the attending physician.  Recommendations may be updated based on patient status, additional functional criteria and insurance authorization.    Follow Up Recommendations  Home health OT    Assistance Recommended at Discharge Frequent or constant Supervision/Assistance  Patient can return home with the following  A little help with walking and/or transfers;A little help with bathing/dressing/bathroom;Direct supervision/assist for medications management;Direct supervision/assist for financial management;Other (comment) (manage LVAD)   Equipment Recommendations  None recommended by OT    Recommendations for Other Services PT consult;Speech consult    Precautions / Restrictions Precautions Precautions: Fall;Other (comment) Precaution Comments: LVAD (x4 year hx), EVD (clamp prior to mobilizing) Restrictions Weight Bearing Restrictions: No        Mobility Bed Mobility Overal bed mobility: Needs Assistance Bed Mobility: Sit to Supine       Sit to supine: Min guard   General bed mobility comments: Close min guard A for safety. poor sequencing and requiring cues for safety and getting back in bed    Transfers Overall transfer level: Needs assistance Equipment used: None Transfers: Sit to/from Stand Sit to Stand: Min assist           General transfer comment: Min A to steady     Balance Overall balance assessment: Needs assistance Sitting-balance support: No upper extremity supported, Feet supported Sitting balance-Leahy Scale: Fair     Standing balance support: No upper extremity supported, During functional activity Standing balance-Leahy Scale: Fair                             ADL either performed or assessed with clinical judgement   ADL Overall ADL's : Needs assistance/impaired     Grooming: Min guard;Moderate assistance;Oral care;Standing Grooming Details (indicate cue type and reason): Pt requiring increased time to note items in L visual field. Min Guard A for safety at first. Pt then with lateral lean with fatigue - Mod A for support as pt leaning to left in standing                             Functional mobility during ADLs: Minimal assistance;Moderate assistance;+2 for safety/equipment General ADL Comments: Pt performing grooming at sink to challenge cognition, left inattention, and balance. Pt's wife practicing changing from batteries to [facility] power.    Extremity/Trunk Assessment Upper Extremity Assessment Upper Extremity Assessment: Overall WFL for tasks assessed   Lower Extremity Assessment Lower Extremity Assessment: Defer to  PT evaluation        Vision       Perception     Praxis      Cognition Arousal/Alertness: Awake/alert Behavior During Therapy: Flat affect Overall Cognitive Status: Impaired/Different from baseline Area of Impairment: Attention,  Memory, Following commands, Safety/judgement, Awareness, Problem solving                 Orientation Level: Disoriented to, Time Current Attention Level: Sustained Memory: Decreased recall of precautions, Decreased short-term memory Following Commands: Follows one step commands with increased time, Follows one step commands consistently Safety/Judgement: Decreased awareness of safety, Decreased awareness of deficits Awareness: Intellectual Problem Solving: Slow processing, Decreased initiation, Difficulty sequencing, Requires verbal cues, Requires tactile cues General Comments: Pt presenting with tangietnal thoughts and conversation. Unable to recall information such as what he had for lunch but claiming her knows. Pt without recognition of cognitive deficits. When going to brush gums at sink, pt orgnized his tooth brush and tooth paste - noticed his grapes - picked up the graps and then walked towards to bed. Requiring redirection for the task at hand. Pt did present with logical thoughts that he would liek to eat his grapes before performing oral care. But clearning not recalling he was performing oral care at all until cued by therapist.        Exercises      Shoulder Instructions       General Comments VSS on 2L. Wife present    Pertinent Vitals/ Pain       Pain Assessment Pain Assessment: Faces Faces Pain Scale: No hurt Pain Intervention(s): Monitored during session  Home Living                                          Prior Functioning/Environment              Frequency  Min 3X/week        Progress Toward Goals  OT Goals(current goals can now be found in the care plan section)  Progress towards OT goals: Progressing toward goals  Acute Rehab OT Goals OT Goal Formulation: With patient/family Time For Goal Achievement: 06/21/21 Potential to Achieve Goals: Good ADL Goals Pt Will Perform Grooming: with modified independence;standing Pt  Will Transfer to Toilet: with modified independence;ambulating;regular height toilet Additional ADL Goal #1: Pt will perform three part path finding task with Min cues Additional ADL Goal #2: Pt will manage LVAD batteries with Min cues  Plan Discharge plan needs to be updated    Co-evaluation                 AM-PAC OT "6 Clicks" Daily Activity     Outcome Measure   Help from another person eating meals?: A Little Help from another person taking care of personal grooming?: A Little Help from another person toileting, which includes using toliet, bedpan, or urinal?: A Little Help from another person bathing (including washing, rinsing, drying)?: A Lot Help from another person to put on and taking off regular upper body clothing?: A Lot Help from another person to put on and taking off regular lower body clothing?: A Lot 6 Click Score: 15    End of Session Equipment Utilized During Treatment: Gait belt;Oxygen  OT Visit Diagnosis: Cognitive communication deficit (R41.841)   Activity Tolerance Patient tolerated treatment well   Patient Left in bed;with call bell/phone within  reach;with bed alarm set;with family/visitor present   Nurse Communication Mobility status        Time: [phone] OT Time Calculation (min): 29 min  Charges: OT General Charges $OT Visit: 1 Visit OT Treatments $Self Care/Home Management : 8-22 mins $Cognitive Funtion inital: Initial 15 mins  Laquon Emel MSOT, OTR/L Acute Rehab Pager: [phone] Office: [place] 06/14/2021, 5:17 PM

## 2021-06-14 NOTE — Progress Notes (Addendum)
Patient ID: David [name], male   DOB: Feb 26, 1966, 56 y.o.   MRN: [id number] P  Advanced Heart Failure VAD Team Note  PCP-Cardiologist: Glori Bickers, MD   Subjective:    2/11 Presented with [place] 2/11 Underwent bedside ventriculostomy wit NSU 2/12 Repeat head CT with resolution of hydrocephalus. Persistent hematoma 2/17 Repeat CT head with increased hemorrhage in right lateral ventricle, slightly increased ventricular dilation.  The ventriculostomy drain is occluded.  2/18 Ventriculostomy drain replaced  Still with headache but pain controlled with meds. No recurrent nausea.   RN notes intermittent confusion since admit  Denies dyspnea. Appetite fair.  MAP 70s-80s on losartan.  Scr 1.71 > 2.2 >1.8 >1.35 > 1.48 > 1.52 > 1.28.  Got IV Lasix + tolvaptan with excellent diuresis, weight down.  Now off diuretics. Hyponatremia resolved. O2 down to 1L.  He remains off anticoagulation.    LVAD INTERROGATION:  HeartMate 3 LVAD:   Flow 4.6 liters/min, speed 5400, power 4.2, PI 4.5. No alarms. VAD interrogated personally. Parameters stable.   Objective:    Vital Signs:   Temp:  [97.5 F (36.4 C)-98.3 F (36.8 C)] 98.1 F (36.7 C) (02/20 0300) Pulse Rate:  [70-98] 70 (02/20 0600) Resp:  [13-26] 16 (02/20 0600) BP: (83-136)/(56-107) 98/78 (02/20 0600) SpO2:  [90 %-98 %] 96 % (02/20 0600) Weight:  [78.2 kg] 78.2 kg (02/20 0200) Last BM Date : 06/10/21 Mean arterial Pressure 80s  Intake/Output:   Intake/Output Summary (Last 24 hours) at 06/14/2021 0800 Last data filed at 06/14/2021 0600 Gross per 24 hour  Intake 1021.03 ml  Output 2629 ml  Net -1607.97 ml     Physical Exam    Physical Exam: GENERAL: No distress. Lying comfortably in bed. HEENT: right frontal ventriculostomy NECK: Supple, No JVD.  2+ bilaterally, no bruits.   CARDIAC:  Mechanical heart sounds with LVAD hum present.  LUNGS:  Clear to auscultation bilaterally.  ABDOMEN:  Soft, round, nontender, positive  bowel sounds x4.     LVAD exit site: well-healed and incorporated.  Dressing dry and intact.  No erythema or drainage.  Stabilization device present and accurately applied.  Driveline dressing is being changed daily per sterile technique. EXTREMITIES:  Warm and dry, no cyanosis, clubbing, rash or edema  NEUROLOGIC:  Alert and oriented x 4.  RN notes intermittent confusion. No aphasia.  No dysarthria.  Affect pleasant.        Telemetry   NSR 70s-80s   Labs   Basic Metabolic Panel: Recent Labs  Lab 06/09/21 0359 06/10/21 0215 06/11/21 0116 06/12/21 0104 06/12/21 0947 06/12/21 1747 06/13/21 0148 06/14/21 0135  NA 133* 132* 131* 126* 129* 134* 131* 137  K 4.3 4.0 4.6 5.1 4.8  --  5.1 4.3  CL 100 97* 97* 90* 92*  --  95* 99  CO2 24 26 27 26 27   --  28 27  GLUCOSE 110* 114* 106* 129* 117*  --  115* 103*  BUN 25* 23* 17 23* 27*  --  32* 25*  CREATININE 2.18* 1.80* 1.35* 1.48* 1.56*  --  1.52* 1.28*  CALCIUM 8.5* 8.5* 8.6* 8.8* 9.2  --  8.8* 9.2  MG 1.9 1.9  --   --   --   --   --   --     Liver Function Tests: Recent Labs  Lab 06/12/21 0947  AST 24  ALT 13  ALKPHOS 85  BILITOT 0.6  PROT 7.3  ALBUMIN 2.8*    No results  for input(s): LIPASE, AMYLASE in the last 168 hours. No results for input(s): AMMONIA in the last 168 hours.   CBC: Recent Labs  Lab 06/10/21 0215 06/11/21 0116 06/12/21 0104 06/13/21 0148 06/14/21 0135  WBC 11.0* 10.1 13.7* 12.1* 11.1*  HGB 13.5 13.3 13.6 13.2 13.1  HCT 42.3 41.8 43.0 41.7 41.5  MCV 84.3 83.8 83.2 84.4 84.5  PLT 176 170 229 204 208    INR: Recent Labs  Lab 06/10/21 0215 06/11/21 0116 06/12/21 0104 06/13/21 0148 06/14/21 0135  INR 1.2 1.2 1.1 1.2 1.1    Other results:    Imaging   CT HEAD WO CONTRAST (5MM)  Result Date: 06/12/2021 CLINICAL DATA:  Hydrocephalus EXAM: CT HEAD WITHOUT CONTRAST TECHNIQUE: Contiguous axial images were obtained from the base of the skull through the vertex without intravenous  contrast. RADIATION DOSE REDUCTION: This exam was performed according to the departmental dose-optimization program which includes automated exposure control, adjustment of the mA and/or kV according to patient size and/or use of iterative reconstruction technique. COMPARISON:  February 17 23. FINDINGS: Brain: Similar dominant focus of intraparenchymal in the right caudothalamic groove. Increased linear intraparenchymal hemorrhage in the right frontal lobe, presumably along the tract of a prior ventriculostomy catheter, now measuring up to 10 mm in thickness. Similar intraventricular extension of hemorrhage, layering in the right lateral ventricle. Right ventriculostomy catheter tip projects lateral to the right lateral ventricle in the right basal ganglia, outside of the ventricular system. Very mild interval increase in rounding of the right temporal horn with otherwise similar mildly dilated ventricles. Effacement of the suprasellar cistern. No evidence of acute large vascular territory infarct. Similar patchy hypodensities in the white matter, nonspecific but felt chronic microvascular ischemic disease. Probable old infarct in the left frontal cortex, similar. Vascular: No hyperdense vessel identified. Skull: Right frontal burr hole. Sinuses/Orbits: Clear sinuses. Other: No mastoid effusions. IMPRESSION: 1. Right ventriculostomy catheter tip now projects lateral to the right lateral ventricle in the right basal ganglia, outside of the ventricular system. 2. Increased linear hemorrhage in the right frontal lobe, presumably along the tract of a previous ventriculostomy catheter. Otherwise, similar intraparenchymal hemorrhage centered in the right caudate thalamic groove and similar intraventricular extension in the right lateral ventricle. Similar effacement of the suprasellar cistern, compatible with an element of downward herniation. 3. Very mild interval increase in rounding of the right temporal horn with  otherwise similar size of the mildly dilated ventricular system, concerning for mild hydrocephalus. Findings discussed with Dr. Zada Finders via telephone at 3:25 p.m. Electronically Signed   By: Margaretha Sheffield M.D.   On: 06/12/2021 15:27     Medications:     Scheduled Medications:  amiodarone  200 mg Oral Daily   atorvastatin  40 mg Oral Daily   Chlorhexidine Gluconate Cloth  6 each Topical Q0600   docusate sodium  200 mg Oral Daily   gabapentin  300 mg Oral BID   losartan  25 mg Oral Daily   magnesium oxide  400 mg Oral BID   mouth rinse  15 mL Mouth Rinse BID   pantoprazole  40 mg Oral Daily   polyethylene glycol  17 g Oral Daily   traZODone  50 mg Oral QHS    Infusions:   ceFAZolin (ANCEF) IV 1 g (06/13/21 2128)    PRN Medications: acetaminophen, hydrALAZINE, ipratropium-albuterol, ondansetron (ZOFRAN) IV, oxyCODONE, polyethylene glycol, traMADol    Assessment/Plan:    1. Intracranial hemorrhage - seen by NSU - CT brain with  intracerebral and intraventricular hemorrhage with obstructive hydrocephalus. INR 2.4 - AC reversed with Kcentra/vit k in ER - Underwent bedside ventriculostomy on 2/11 - Initial ventriculostomy drain occluded. CT head 2/17 with increased hemorrhage in right lateral ventricle, slightly increased ventricular dilation.  On 2/18, ventriculostomy drain replaced.  - Headache improved but has not resolved. Continue pain control. No nausea. - With HM-3 device ok to continue to hold [facility] for at least a few more days (or longer) without too much risk. Will start warfarin (no heparin) once drain out   2. Chronic systolic CHF with HM III LVAD implant 04/20/17:  - Has done well with VAD support. - He has been seen at [facility] for transplant referral but has decided not to pursue transplant. No change - New oxygen requirement with pulmonary edema on 2/17 CXR => good diuresis on 2/18.  - Oxygen saturation down to 1L [place].  Now off diuretics. Volume looks stable.   3.  Paroxysmal AFL with RVR - had DC-CV on 10/17/18 with subsequent short episodes of AF after but has been mostly in NSR on low-dose amio (100 daily) - Had recurrent AF in 10/22. Amio increased to 200 bid and converted.  - Now in NSR. Continue amio 200 daily - No change   4. h/o UGI AVM bleed - s/p APC x 4 lesions in 2/19 - No recurrent bleeding  - Continue PPI - Off ASA due to GIB.  - Hgb stable at 13.1   5. AKI on CKD Stage IIIa:  - Creatinine baseline 1.3 - 1.7  - Creatinine 1.71> 2.18>1.8>1.35>1.48>1.52>1.28 - Suspect ATN from hypotension, MAP now stable. - With mild rise in creatinine, decreased losartan to 25 mg daily.    5. VAD management - VAD interrogated personally. Parameters stable. - LDH has been stable  - INR goal 2.0-2.5 Off ASA due to GIB 2/19.  - INR 2.4  -> now reversed due to [place]. - MAPs stable 70s-80s - DL site looks good   6. HTN - goal MAP 70-90 with ICH  - losartan increased 2/14 with resultant hypotension and AKI - Hydralazine stopped.  - Losartan decreased to 25 mg daily on 02/18 d/t bump in Scr  7. CAD:   - s/p PCI RCA/PLOM with DES x2 and DES to mild LAD 1/18 - No angina - Continue statin. Off ASA due to GIB   8. Hypomagnesemia. -  supplement as needed.  - Recheck today  9. Back and buttock pain - PRN oxycodone  10. Hyponatremia - Received tolvaptan 2/18, Na up to 137 today - Keep fluid restricted.      Length of Stay: 9  FINCH, LINDSAY N, PA-C 06/14/2021, 8:00 AM  VAD Team --- VAD ISSUES ONLY--- Pager [phone] (7am - 7am)  Advanced Heart Failure Team  Pager [phone] (M-F; 7a - 5p)  Please contact [facility] for night-coverage after hours (5p -7a ) and weekends on [email or web address]  Agree with above  Ventriculostomy remains in place with good drainage. HA improved. Respiratory status improved with IV lasix over the weekend. Denies SOB or orthopnea. MAPs in 80-90s on po losartan. Renal function stabilized. Remains off [facility]  VAD  interrogated personally. Parameters stable.  General:  NAD.  HEENT: normal  + ventriculostomy drain Neck: supple. JVP not elevated.  Carotids 2+ bilat; no bruits. No lymphadenopathy or thryomegaly appreciated. Cor: LVAD hum.  Lungs: Clear. Abdomen: soft, nontender, non-distended. No hepatosplenomegaly. No bruits or masses. Good bowel sounds. Driveline site clean. Anchor in place.  Extremities: no cyanosis, clubbing, rash. Warm no edema  Neuro: alert & oriented x 3. No focal deficits. Moves all 4 without problem   Continues with ventriculostomy drain in place. CTs from this weekend reviewed.  Appreciate NSU ongoing support. Continue to hold [facility].   Keep MAPs 70-90s. Watch volume status.   Up to chair today.   CRITICAL CARE Performed by: Glori Bickers  Total critical care time: 35 minutes  Critical care time was exclusive of separately billable procedures and treating other patients.  Critical care was necessary to treat or prevent imminent or life-threatening deterioration.  Critical care was time spent personally by me (independent of midlevel providers or residents) on the following activities: development of treatment plan with patient and/or surrogate as well as nursing, discussions with consultants, evaluation of patient's response to treatment, examination of patient, obtaining history from patient or surrogate, ordering and performing treatments and interventions, ordering and review of laboratory studies, ordering and review of radiographic studies, pulse oximetry and re-evaluation of patient's condition.  Glori Bickers, MD  11:30 AM

## 2021-06-14 NOTE — Progress Notes (Signed)
LVAD Coordinator Rounding Note:  Admitted 06/05/21 due to [place] with hydrocephalus to Dr Bensimhon's service. Ventriculostomy placed at bedside by Dr Arnoldo Morale.   HM III LVAD implanted on 04/20/17 by Dr Cyndia Bent under destination therapy criteria.  EVD (external ventricular drain) was replaced x 2 on 06/12/21 by Dr. Zada Finders (Neuro).   Pt in bed this morning dozing; arouses easily to verbal stimuli. He is alert and oriented x 3. He is aware what happened, "I had a brain bleed", and interventions that are taking place.    Chief complaint this am is headache; BS nurse giving prn.     Anticoagulation still on hold; timing of restart per Dr Haroldine Laws and neurosurgery.   PT and RN at bedside. Discussed plan today will be for PT to work with wife when she arrives on switching power sources on VAD controller.   Vital signs: Temp: 98.1 HR: 71 Doppler Pressure: 90 Automatic BP: 96/80 (87) O2 Sat: 96% on 2L/[place] Wt:182.3>186>186.5>189.6>190.2>197.5>187.6>172 lbs    LVAD interrogation reveals:  Speed: 5400 Flow: 4.8 Power:  4.1w PI: 3.8 Hematocrit: 42  Alarms: none Events: rare PI event   Fixed speed: 5400 Low speed limit: 5100  Drive Line: Existing VAD dressing removed and site care performed using sterile technique. Drive line exit site cleaned with Chlora prep applicators x 2, allowed to dry, and Sorbaview dressing with Silverlon patch applied. Exit site healed and incorporated, the velour is fully implanted at exit site. No redness, tenderness, drainage, foul odor or rash noted. Drive line anchor re-applied.  Weekly dressing changes per bedside RN or pt's wife. Next dressing change due: 06/21/21.  Labs:  LDH trend: 166>176>157>162>165>190>205>176  INR trend: 1.0>1.2>1.1>1.2>1.1  WBC: 14.8>10.9>10>11>10.1>13.7>12.1>11.1  Anticoagulation Plan: -INR Goal: 2 - 2.5  (anticoagulation on hold currently due to [place]) -ASA Dose: none due to hx of GI bleed  Device: - Medtronic BiV -Therapies:  on- VF 200, VT 167  AT 171 monitored - Last check: 04/29/21   Adverse Events on VAD: - Readmitted 2/19 hgb 3.8. INR 2.9.transfused 4u pRBCs. 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 Call VAD coordinator for any VAD equipment or drive line issues 2. Weekly drive line dressing change per bedside RN or pt's wife. Next dressing change due 06/21/21.  Zada Girt RN [place] Coordinator  Office: [phone]  24/7 Pager: [phone]

## 2021-06-15 DIAGNOSIS — I629 Nontraumatic intracranial hemorrhage, unspecified: Secondary | ICD-10-CM | POA: Diagnosis not present

## 2021-06-15 LAB — LACTATE DEHYDROGENASE: LDH: 178 U/L (ref 98–192)

## 2021-06-15 LAB — BASIC METABOLIC PANEL
Anion gap: 10 (ref 5–15)
BUN: 22 mg/dL — ABNORMAL HIGH (ref 6–20)
CO2: 28 mmol/L (ref 22–32)
Calcium: 9.1 mg/dL (ref 8.9–10.3)
Chloride: 95 mmol/L — ABNORMAL LOW (ref 98–111)
Creatinine, Ser: 1.26 mg/dL — ABNORMAL HIGH (ref 0.61–1.24)
GFR, Estimated: >60 mL/min (ref >60)
Glucose, Bld: 101 mg/dL — ABNORMAL HIGH (ref 70–99)
Potassium: 4.1 mmol/L (ref 3.5–5.1)
Sodium: 133 mmol/L — ABNORMAL LOW (ref 135–145)

## 2021-06-15 LAB — PROTIME-INR
INR: 1.2 (ref 0.8–1.2)
Prothrombin Time: 14.9 seconds (ref 11.4–15.2)

## 2021-06-15 LAB — CBC
HCT: 42.3 % (ref 39.0–52.0)
Hemoglobin: 13 g/dL (ref 13.0–17.0)
MCH: 26.2 pg (ref 26.0–34.0)
MCHC: 30.7 g/dL (ref 30.0–36.0)
MCV: 85.3 fL (ref 80.0–100.0)
Platelets: 215 10*3/uL (ref 150–400)
RBC: 4.96 MIL/uL (ref 4.22–5.81)
RDW: 14 % (ref 11.5–15.5)
WBC: 9.8 10*3/uL (ref 4.0–10.5)
nRBC: 0 % (ref 0.0–0.2)

## 2021-06-15 MED ORDER — FUROSEMIDE 10 MG/ML IJ SOLN
40.0000 mg | Freq: Once | INTRAMUSCULAR | Status: AC
  Administered 2021-06-15: 40 mg via INTRAVENOUS
  Filled 2021-06-15: qty 4

## 2021-06-15 MED ORDER — POTASSIUM CHLORIDE CRYS ER 20 MEQ PO TBCR
20.0000 meq | EXTENDED_RELEASE_TABLET | Freq: Once | ORAL | Status: AC
  Administered 2021-06-15: 20 meq via ORAL
  Filled 2021-06-15: qty 1

## 2021-06-15 NOTE — Progress Notes (Signed)
Subjective: The patient is alert and pleasant.  He has no complaints.  Objective: Vital signs in last 24 hours: Temp:  [97.4 F (36.3 C)-98.2 F (36.8 C)] 97.4 F (36.3 C) (02/21 0330) Pulse Rate:  [31-91] 64 (02/21 0600) Resp:  [13-24] 17 (02/21 0600) BP: (81-103)/(63-91) 89/75 (02/21 0600) SpO2:  [88 %-97 %] 90 % (02/21 0600) Weight:  [86 kg] 86 kg (02/21 0424) Estimated body mass index is 31.55 kg/m as calculated from the following:   Height as of this encounter: 5\' 5"  (1.651 m).   Weight as of this encounter: 86 kg.   Intake/Output from previous day: 02/20 0701 - 02/21 0700 In: 231.5 [I.V.:2.8; IV Piggyback:228.7] Out: 1286 [Urine:1195; Drains:91] Intake/Output this shift: No intake/output data recorded.  Physical exam the patient is alert and oriented.  His speech and strength is normal.  The patient's ventriculostomy is patent and draining.  Lab Results: Recent Labs    06/14/21 0135 06/15/21 0111  WBC 11.1* 9.8  HGB 13.1 13.0  HCT 41.5 42.3  PLT 208 215   BMET Recent Labs    06/14/21 0135 06/15/21 0111  NA 137 133*  K 4.3 4.1  CL 99 95*  CO2 27 28  GLUCOSE 103* 101*  BUN 25* 22*  CREATININE 1.28* 1.26*  CALCIUM 9.2 9.1    Studies/Results: No results found.  Assessment/Plan: Intracerebral hemorrhage, interventricular hemorrhage, obstructive hydrocephalus: The patient is doing well with his ventriculostomy.  We will likely continue it for the rest of this week and attempt to elevate/remove next week.  LOS: 10 days     [name] 06/15/2021, 7:49 AM     Patient ID: David [name], male   DOB: 1965-11-04, 56 y.o.   MRN: [id number]

## 2021-06-15 NOTE — Progress Notes (Signed)
06/15/21 1400  °Clinical Encounter Type  °Visited With Family;Patient;Health care provider  °Visit Type Critical Care;Spiritual support;Initial  °Referral From Chaplain  °Consult/Referral To Chaplain  °Spiritual Encounters  °Spiritual Needs Emotional;Prayer  °Stress Factors  °Family Stress Factors Health changes  ° °Referral from Chaplain Cathy Johnson & Patient's Primary Nurse to meet with and provide emotional and spiritual support to Mr. And Mrs. Neyman during his hospitalization.  ° °Met with Mrs. Meggison in the family consultation room. She shared her pain and fear associated to the critical nature of Mackey's condition. She told of the love and support that he has provided to her and their children during their 25 years of marriage. She talked about how difficult it is for her to see him in this condition, as he has always been her rock and supported her through her low-points in life. She spoke of the difficulty of leaving him and going home at night to an empty house. She discussed the support she has been getting from her children and her church family. Mrs. Bourque has a strong faith in God and feels his presence during this time. She asks for continued prayer and emotional support during this difficult time. ° °Met with Mr. and Mrs. Bidinger, along with a member of their church at patient's bedside. Mr. Sulton also has a strong faith and trusts in God's healing power. Tiron spoke of his love for his wife and how she has been his rock throughout their marriage. At the [facility]'s request we shared in healing Prayer for [place]; Prayer for Comfort and support for his wife and family; and for the medical community caring for Mr. Goya. ° °Thank You for the opportunity to serve the [facility]. Chaplain  , M.Min., [phone]. °

## 2021-06-15 NOTE — Progress Notes (Addendum)
Patient ID: David [name], male   DOB: [birth year], 56 y.o.   MRN: [id number] P  Advanced Heart Failure VAD Team Note  PCP-Cardiologist: Glori Bickers, MD   Subjective:    2/11 Presented with [place] 2/11 Underwent bedside ventriculostomy wit NSU 2/12 Repeat head CT with resolution of hydrocephalus. Persistent hematoma 2/17 Repeat CT head with increased hemorrhage in right lateral ventricle, slightly increased ventricular dilation.  The ventriculostomy drain is occluded.  2/18 Ventriculostomy drain replaced  Ventriculostomy drain remains in and draining. Denies HA.  Denies CP or SOB.   MAPs in 80s   LVAD INTERROGATION:  HeartMate 3 LVAD:   Flow 4.6 liters/min, speed 5400, power 4.0, PI 4.8. VAD interrogated personally. Parameters stable.  Objective:    Vital Signs:   Temp:  [97.4 F (36.3 C)-98.2 F (36.8 C)] 97.4 F (36.3 C) (02/21 0330) Pulse Rate:  [31-91] 64 (02/21 0600) Resp:  [13-24] 17 (02/21 0600) BP: (81-103)/(63-91) 89/75 (02/21 0600) SpO2:  [88 %-97 %] 90 % (02/21 0600) Weight:  [86 kg] 86 kg (02/21 0424) Last BM Date : 06/14/21 Mean arterial Pressure 80s  Intake/Output:   Intake/Output Summary (Last 24 hours) at 06/15/2021 0751 Last data filed at 06/15/2021 0600 Gross per 24 hour  Intake 231.52 ml  Output 1286 ml  Net -1054.48 ml      Physical Exam    General:  NAD. Sitting up in bed  HEENT: normal   Neck: supple. JVP not elevated.  Carotids 2+ bilat; no bruits. No lymphadenopathy or thryomegaly appreciated. Cor: LVAD hum.  Lungs: Clear. Abdomen: obese soft, nontender, non-distended. No hepatosplenomegaly. No bruits or masses. Good bowel sounds. Driveline site clean. Anchor in place.  Extremities: no cyanosis, clubbing, rash. Warm no edema  Neuro: alert & oriented x 3. No focal deficits. Moves all 4 without problem   Telemetry   NSR 70s-80s   Labs   Basic Metabolic Panel: Recent Labs  Lab 06/09/21 0359 06/10/21 0215 06/11/21 0116  06/12/21 0104 06/12/21 0947 06/12/21 1747 06/13/21 0148 06/14/21 0135 06/15/21 0111  NA 133* 132*   < > 126* 129* 134* 131* 137 133*  K 4.3 4.0   < > 5.1 4.8  --  5.1 4.3 4.1  CL 100 97*   < > 90* 92*  --  95* 99 95*  CO2 24 26   < > 26 27  --  28 27 28   GLUCOSE 110* 114*   < > 129* 117*  --  115* 103* 101*  BUN 25* 23*   < > 23* 27*  --  32* 25* 22*  CREATININE 2.18* 1.80*   < > 1.48* 1.56*  --  1.52* 1.28* 1.26*  CALCIUM 8.5* 8.5*   < > 8.8* 9.2  --  8.8* 9.2 9.1  MG 1.9 1.9  --   --   --   --   --  2.0  --    < > = values in this interval not displayed.     Liver Function Tests: Recent Labs  Lab 06/12/21 0947  AST 24  ALT 13  ALKPHOS 85  BILITOT 0.6  PROT 7.3  ALBUMIN 2.8*     No results for input(s): LIPASE, AMYLASE in the last 168 hours. No results for input(s): AMMONIA in the last 168 hours.   CBC: Recent Labs  Lab 06/11/21 0116 06/12/21 0104 06/13/21 0148 06/14/21 0135 06/15/21 0111  WBC 10.1 13.7* 12.1* 11.1* 9.8  HGB 13.3 13.6 13.2 13.1 13.0  HCT 41.8 43.0 41.7 41.5 42.3  MCV 83.8 83.2 84.4 84.5 85.3  PLT 170 229 204 208 215     INR: Recent Labs  Lab 06/11/21 0116 06/12/21 0104 06/13/21 0148 06/14/21 0135 06/15/21 0111  INR 1.2 1.1 1.2 1.1 1.2     Other results:    Imaging   No results found.   Medications:     Scheduled Medications:  amiodarone  200 mg Oral Daily   atorvastatin  40 mg Oral Daily   Chlorhexidine Gluconate Cloth  6 each Topical Q0600   docusate sodium  200 mg Oral Daily   gabapentin  300 mg Oral BID   losartan  25 mg Oral Daily   magnesium oxide  400 mg Oral BID   mouth rinse  15 mL Mouth Rinse BID   pantoprazole  40 mg Oral Daily   polyethylene glycol  17 g Oral Daily   traZODone  50 mg Oral QHS    Infusions:  sodium chloride Stopped (06/14/21 2232)    ceFAZolin (ANCEF) IV 1 g (06/15/21 0635)    PRN Medications: sodium chloride, acetaminophen, hydrALAZINE, ipratropium-albuterol, ondansetron  (ZOFRAN) IV, oxyCODONE, polyethylene glycol, traMADol    Assessment/Plan:    1. Intracranial hemorrhage - CT brain with intracerebral and intraventricular hemorrhage with obstructive hydrocephalus. INR 2.4 - AC reversed with Kcentra/vit k in ER - Underwent bedside ventriculostomy on 2/11 - Initial ventriculostomy drain occluded. CT head 2/17 with increased hemorrhage in right lateral ventricle, slightly increased ventricular dilation.  On 2/18, ventriculostomy drain replaced.  - HA improved - Continues to drain. D/w NSU this am. Will leave drain in for the rest of the week  - With HM-3 device ok to continue to hold [facility] for at least a few more days (or longer) without too much risk. Will start warfarin (no heparin) once drain out   2. Chronic systolic CHF with HM III LVAD implant 04/20/17:  - Has done well with VAD support. - He has been [facility] at [facility] for transplant referral but has decided not to pursue transplant. No change - New oxygen requirement with pulmonary edema on 2/17 CXR => good diuresis on 2/18.  - Oxygen saturation down to 1L [place].  Now off diuretics. Weight back up. Will give one dose of IV lasix today   3. Paroxysmal AFL with RVR - had DC-CV on 10/17/18 with subsequent short episodes of AF after but has been mostly in NSR on low-dose amio (100 daily) - Had recurrent AF in 10/22. Amio increased to 200 bid and converted.  - Remains in NSR. Continue amio 200 daily - NNo changes   4. h/o UGI AVM bleed - s/p APC x 4 lesions in 2/19 - No recurrent bleeding  - Continue PPI - Off ASA due to GIB.  - Hgb stable at 13.0    5. AKI on CKD Stage IIIa:  - Creatinine baseline 1.3 - 1.7  - Creatinine stabilized at 1.28 - Suspect ATN from hypotension, MAP now stable.  6. VAD management - VAD interrogated personally. Parameters stable. - LDH has been stable  - INR goal 2.0-2.5 Off ASA due to GIB 2/19.  - INR 2.4 on a-> now reversed due to [place]. INR 1.2 - MAPs stable 80s - DL site  looks good   6. HTN - goal MAP 70-90 with ICH  - losartan increased 2/14 with resultant hypotension and AKI - Hydralazine stopped.  - Losartan decreased to 25 mg daily on 02/18 d/t bump in  Scr  7. CAD:   - s/p PCI RCA/PLOM with DES x2 and DES to mild LAD 1/18 - No angina - Continue statin. Off ASA due to GIB   8. Hypomagnesemia. -  supplement as needed.   9. Back and buttock pain - PRN oxycodone  10. Hyponatremia - Received tolvaptan 2/18, Na 133 today - FW restrict   CRITICAL CARE Performed by: Glori Bickers  Total critical care time: 35 minutes  Critical care time was exclusive of separately billable procedures and treating other patients.  Critical care was necessary to treat or prevent imminent or life-threatening deterioration.  Critical care was time spent personally by me (independent of midlevel providers or residents) on the following activities: development of treatment plan with patient and/or surrogate as well as nursing, discussions with consultants, evaluation of patient's response to treatment, examination of patient, obtaining history from patient or surrogate, ordering and performing treatments and interventions, ordering and review of laboratory studies, ordering and review of radiographic studies, pulse oximetry and re-evaluation of patient's condition.    Length of Stay: [place], MD 06/15/2021, 7:51 AM  VAD Team --- VAD ISSUES ONLY--- Pager [phone] (7am - 7am)  Advanced Heart Failure Team  Pager [phone] (M-F; 7a - 5p)  Please contact [facility] for night-coverage after hours (5p -7a ) and weekends on [email or web address]

## 2021-06-15 NOTE — Progress Notes (Signed)
LVAD Coordinator Rounding Note:  Admitted 06/05/21 due to [place] with hydrocephalus to Dr Bensimhon's service. Ventriculostomy placed at bedside by Dr Arnoldo Morale.   HM III LVAD implanted on 04/20/17 by Dr Cyndia Bent under destination therapy criteria.  EVD (external ventricular drain) was replaced x 2 on 06/12/21 by Dr. Zada Finders (Neuro).   Pt in bed this morning awake, answering questions appropriately. Denies complaints.   Vital signs: Temp: 98.2 HR: 72 Doppler Pressure: 80 Automatic BP: 93/70 (78) O2 Sat: 98% on 2L/[place] Wt:182.3>186>186.5>189.6>190.2>197.5>187.6>172>189.6 lbs    LVAD interrogation reveals:  Speed: 5400 Flow: 4.6 Power:  4.1w PI: 4.6 Hematocrit: 42  Alarms: none Events: none  Fixed speed: 5400 Low speed limit: 5100  Drive Line: Dressing C/D/I with anchor intact and accurately applied.  Weekly dressing changes per bedside RN or pt's wife. Next dressing change due: 06/21/21.  Labs:  LDH trend: [id number]  INR trend: 1.0>1.2>1.1>1.2>1.1>1.2  WBC: 14.8>10.9>10>11>10.1>13.7>12.1>11.1>9.8  Anticoagulation Plan: -INR Goal: 2 - 2.5  (anticoagulation on hold currently due to [place]) -ASA Dose: none due to hx of GI bleed  Device: - Medtronic BiV -Therapies: on- VF 200, VT 167  AT 171 monitored - Last check: 04/29/21   Adverse Events on VAD: - Readmitted 2/19 hgb 3.8. INR 2.9.transfused 4u pRBCs. 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 Call VAD coordinator for any VAD equipment or drive line issues 2. Weekly drive line dressing change per bedside RN or pt's wife. Next dressing change due 06/21/21.  Zada Girt RN [place] Coordinator  Office: [phone]  24/7 Pager: [phone]

## 2021-06-16 DIAGNOSIS — I629 Nontraumatic intracranial hemorrhage, unspecified: Secondary | ICD-10-CM | POA: Diagnosis not present

## 2021-06-16 LAB — CBC
HCT: 40.1 % (ref 39.0–52.0)
Hemoglobin: 12.6 g/dL — ABNORMAL LOW (ref 13.0–17.0)
MCH: 26.3 pg (ref 26.0–34.0)
MCHC: 31.4 g/dL (ref 30.0–36.0)
MCV: 83.5 fL (ref 80.0–100.0)
Platelets: 245 10*3/uL (ref 150–400)
RBC: 4.8 MIL/uL (ref 4.22–5.81)
RDW: 13.8 % (ref 11.5–15.5)
WBC: 10 10*3/uL (ref 4.0–10.5)
nRBC: 0 % (ref 0.0–0.2)

## 2021-06-16 LAB — BASIC METABOLIC PANEL
Anion gap: 10 (ref 5–15)
BUN: 25 mg/dL — ABNORMAL HIGH (ref 6–20)
CO2: 29 mmol/L (ref 22–32)
Calcium: 8.7 mg/dL — ABNORMAL LOW (ref 8.9–10.3)
Chloride: 95 mmol/L — ABNORMAL LOW (ref 98–111)
Creatinine, Ser: 1.58 mg/dL — ABNORMAL HIGH (ref 0.61–1.24)
GFR, Estimated: 51 mL/min — ABNORMAL LOW (ref >60)
Glucose, Bld: 93 mg/dL (ref 70–99)
Potassium: 4.4 mmol/L (ref 3.5–5.1)
Sodium: 134 mmol/L — ABNORMAL LOW (ref 135–145)

## 2021-06-16 LAB — PROTIME-INR
INR: 1.1 (ref 0.8–1.2)
Prothrombin Time: 14.5 seconds (ref 11.4–15.2)

## 2021-06-16 LAB — LACTATE DEHYDROGENASE: LDH: 174 U/L (ref 98–192)

## 2021-06-16 NOTE — Progress Notes (Signed)
LVAD Coordinator Rounding Note:  Admitted 06/05/21 due to [place] with hydrocephalus to Dr Bensimhon's service. Ventriculostomy placed at bedside by Dr Arnoldo Morale.   HM III LVAD implanted on 04/20/17 by Dr Cyndia Bent under destination therapy criteria.  EVD (external ventricular drain) was replaced x 2 on 06/12/21 by Dr. Zada Finders (Neuro).   Pt in bed this morning awake, answering questions appropriately. Does complain of headache and leg pain at times; says he does not need pain medication at this time.   Vital signs: Temp: 98.9 HR: 71 Doppler Pressure: 70 Automatic BP: 83/47 (59) O2 Sat: 95% on 1L/[place] Wt:182.3>186>186.5>189.6>190.2>197.5>187.6>172>189.6>187.6 lbs    LVAD interrogation reveals:  Speed: 5400 Flow: 4.7 Power:  4.0w PI: 3.9 Hematocrit: 42  Alarms: none Events: 10 PI events yesterday  Fixed speed: 5400 Low speed limit: 5100  Drive Line: Dressing C/D/I with anchor intact and accurately applied.  Weekly dressing changes per bedside RN or pt's wife. Next dressing change due: 06/21/21.  Labs:  LDH trend: 166>176>157>162>165>190>205>176>178>174  INR trend: 1.0>1.2>1.1>1.2>1.1>1.2>1.1   Anticoagulation Plan: -INR Goal: 2 - 2.5  (anticoagulation on hold currently due to [place]) -ASA Dose: none due to hx of GI bleed  Device: - Medtronic BiV -Therapies: on- VF 200, VT 167  AT 171 monitored - Last check: 04/29/21   Adverse Events on VAD: - Readmitted 2/19 hgb 3.8. INR 2.9.transfused 4u pRBCs. 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 Call VAD coordinator for any VAD equipment or drive line issues 2. Weekly drive line dressing change per bedside RN or pt's wife. Next dressing change due 06/21/21.  Zada Girt RN [place] Coordinator  Office: [phone]  24/7 Pager: [phone]

## 2021-06-16 NOTE — Progress Notes (Signed)
°   06/16/21 1415  Clinical Encounter Type  Visited With Patient and family together  Visit Type Critical Care;Follow-up  Referral From Other (Comment) (Rounding)  Consult/Referral To Chaplain  Stress Factors  Family Stress Factors Exhausted;Major life changes   While rounding 2H stopped in to meet with David [name] family. David [name] is sitting up in chair, alert and oriented. Patient is in good spirits, encouraged and hopeful. Was introduced to David [name]'s eldest daughter and was there visiting patient and supporting her mother. David [name] is tired yet encouraged and hopeful by David [name]'s progress today. Chaplain Berk Pilot, M.Min., [phone].

## 2021-06-16 NOTE — Progress Notes (Signed)
Physical Therapy Treatment Patient Details Name: David [name] MRN: [id number] DOB: [birth year] Today's Date: 06/16/2021   History of Present Illness 56 y.o. male who presented 06/05/21 with worsening HA. CT showed right basal ganglia hemorrhage, intraventricular hemorrhage and obstructive hydrocephalus. S/p placement of right frontal ventriculostomy via bur hole 2/11. 2/12 Repeat head CT with resolution of hydrocephalus. PMH: ICD, HfrEF, HLD, HTN, s/p LVAD HM-III 04/20/18, CAD, CKD, MI, seizures    PT Comments    Pt progressing towards his mobility goals. Noted power cord was too tight to unscrew to change from wall power to batteries; VAD coordinator called and used hemostat to loosen. Pt ambulating 200 feet with David [name] at a min assist level. Demonstrates left lateral lean and inattention. Worked on head turns and locating objects on left side. Will continue to progress as tolerated.     Recommendations for follow up therapy are one component of a multi-disciplinary discharge planning process, led by the attending physician.  Recommendations may be updated based on patient status, additional functional criteria and insurance authorization.  Follow Up Recommendations  Home health PT     Assistance Recommended at Discharge Frequent or constant Supervision/Assistance  Patient can return home with the following A little help with bathing/dressing/bathroom;Assistance with cooking/housework;Direct supervision/assist for financial management;Direct supervision/assist for medications management;Assist for transportation;A little help with walking and/or transfers;Help with stairs or ramp for entrance   Equipment Recommendations  None recommended by PT    Recommendations for Other Services       Precautions / Restrictions Precautions Precautions: Fall;Other (comment) Precaution Comments: LVAD (x4 year hx), EVD (clamp prior to mobilizing) Restrictions Weight Bearing Restrictions: No      Mobility  Bed Mobility Overal bed mobility: Needs Assistance Bed Mobility: Supine to Sit, Rolling Rolling: Supervision   Supine to sit: Min assist     General bed mobility comments: Light minA for trunk elevation to upright    Transfers Overall transfer level: Needs assistance Equipment used: None Transfers: Sit to/from Stand Sit to Stand: Min assist           General transfer comment: Light minA to initially steady    Ambulation/Gait Ambulation/Gait assistance: Min assist, +2 safety/equipment Gait Distance (Feet): 200 Feet Assistive device: David [name] Gait Pattern/deviations: Step-through pattern, Decreased stride length (leaning L)       General Gait Details: Pt with increased left lateral lean and decreased gait speed when fatigued, able to correct somewhat with cueing for midline. Performing head turns without gross loss of balance.   Stairs             Wheelchair Mobility    Modified Rankin (Stroke Patients Only) Modified Rankin (Stroke Patients Only) Pre-Morbid Rankin Score: No symptoms Modified Rankin: Moderately severe disability     Balance Overall balance assessment: Needs assistance Sitting-balance support: No upper extremity supported, Feet supported Sitting balance-Leahy Scale: Fair     Standing balance support: No upper extremity supported, During functional activity Standing balance-Leahy Scale: Fair                              Cognition Arousal/Alertness: Awake/alert Behavior During Therapy: Flat affect Overall Cognitive Status: Impaired/Different from baseline Area of Impairment: Attention, Memory, Following commands, Safety/judgement, Awareness, Problem solving                 Orientation Level: Disoriented to, Time Current Attention Level: Sustained Memory: Decreased recall of precautions, Decreased short-term memory Following  Commands: Follows one step commands with increased time, Follows one step  commands consistently Safety/Judgement: Decreased awareness of safety, Decreased awareness of deficits Awareness: Intellectual Problem Solving: Slow processing, Decreased initiation, Difficulty sequencing, Requires verbal cues, Requires tactile cues General Comments: Pt not oriented to place or time, reporting he was in [place]. Pt able to state reason he was here was because of "my brain." Decreased awareness of limitations i.e. activity tolerance during walk cannot state when he is fatigued        Exercises General Exercises - Lower Extremity Long Arc Quad: Both, 10 reps, Seated    General Comments        Pertinent Vitals/Pain Pain Assessment Pain Assessment: Faces Faces Pain Scale: Hurts little more Pain Location: head Pain Descriptors / Indicators: Headache Pain Intervention(s): Monitored during session    Home Living                          Prior Function            PT Goals (current goals can now be found in the care plan section) Acute Rehab PT Goals Patient Stated Goal: to improve Potential to Achieve Goals: Good Progress towards PT goals: Progressing toward goals    Frequency    Min 3X/week      PT Plan Current plan remains appropriate    Co-evaluation              AM-PAC PT "6 Clicks" Mobility   Outcome Measure  Help needed turning from your back to your side while in a flat bed without using bedrails?: A Little Help needed moving from lying on your back to sitting on the side of a flat bed without using bedrails?: A Little Help needed moving to and from a bed to a chair (including a wheelchair)?: A Little Help needed standing up from a chair using your arms (e.g., wheelchair or bedside chair)?: A Little Help needed to walk in hospital room?: A Little Help needed climbing 3-5 steps with a railing? : A Lot 6 Click Score: 17    End of Session Equipment Utilized During Treatment: Oxygen Activity Tolerance: Patient tolerated  treatment well Patient left: in chair;with call bell/phone within reach;with family/visitor present Nurse Communication: Mobility status PT Visit Diagnosis: Unsteadiness on feet (R26.81);Other abnormalities of gait and mobility (R26.89);Difficulty in walking, not elsewhere classified (R26.2)     Time: 8329-1916 PT Time Calculation (min) (ACUTE ONLY): 44 min  Charges:  $Gait Training: 8-22 mins $Therapeutic Activity: 23-37 mins                     Wyona Almas, PT, DPT Acute Rehabilitation Services Pager [phone] Office [phone]    Deno Etienne 06/16/2021, 5:04 PM

## 2021-06-16 NOTE — Progress Notes (Addendum)
Patient ID: David [name], male   DOB: 08-30-1965, 56 y.o.   MRN: [id number] P  Advanced Heart Failure VAD Team Note  PCP-Cardiologist: Glori Bickers, MD   Subjective:    2/11 Presented with [place] 2/11 Underwent bedside ventriculostomy wit NSU 2/12 Repeat head CT with resolution of hydrocephalus. Persistent hematoma 2/17 Repeat CT head with increased hemorrhage in right lateral ventricle, slightly increased ventricular dilation.  The ventriculostomy drain is occluded.  2/18 Ventriculostomy drain replaced  Ventriculostomy drain remains in and draining.   Received 40 mg lasix IV yesterday. Wt down 2 lb. Scr 1.26>1.58  MAPs stable 70s-80s  No dyspnea. Denies recurrent nausea. Complaining of headache.   LVAD INTERROGATION:  HeartMate 3 LVAD:   Flow 4.7 liters/min, speed 5400, power 4, PI 3.7. No PI events. VAD interrogated personally. Parameters stable.  Objective:    Vital Signs:   Temp:  [97.4 F (36.3 C)-98.9 F (37.2 C)] 98.9 F (37.2 C) (02/22 0700) Pulse Rate:  [30-158] 31 (02/22 0600) Resp:  [11-19] 16 (02/22 0600) BP: (71-119)/(56-101) 92/79 (02/22 0600) SpO2:  [90 %-98 %] 95 % (02/22 0600) Weight:  [85.1 kg] 85.1 kg (02/22 0500) Last BM Date : 06/14/21 Mean arterial Pressure 80s  Intake/Output:   Intake/Output Summary (Last 24 hours) at 06/16/2021 0956 Last data filed at 06/16/2021 0800 Gross per 24 hour  Intake 256.33 ml  Output 1297 ml  Net -1040.67 ml     Physical Exam    Physical Exam: GENERAL: Well appearing, male who presents to clinic today in no acute distress. HEENT: right frontal ventriculostomy NECK: No JVD.  2+ bilaterally, no bruits.   CARDIAC:  Mechanical heart sounds with LVAD hum present.  LUNGS:  Clear to auscultation bilaterally.  ABDOMEN:  Soft, round, nontender, positive bowel sounds x4.     LVAD exit site: well-healed and incorporated.  Dressing dry and intact.  No erythema or drainage.  Stabilization device present and accurately  applied.  Driveline dressing is being changed daily per sterile technique. EXTREMITIES:  Warm and dry, no cyanosis, clubbing, rash or edema  NEUROLOGIC:  Alert and oriented x 4.  Gait steady.  No aphasia.  No dysarthria.  Affect pleasant.      Telemetry   SR 70s   Labs   Basic Metabolic Panel: Recent Labs  Lab 06/10/21 0215 06/11/21 0116 06/12/21 0947 06/12/21 1747 06/13/21 0148 06/14/21 0135 06/15/21 0111 06/16/21 0258  NA 132*   < > 129* 134* 131* 137 133* 134*  K 4.0   < > 4.8  --  5.1 4.3 4.1 4.4  CL 97*   < > 92*  --  95* 99 95* 95*  CO2 26   < > 27  --  28 27 28 29   GLUCOSE 114*   < > 117*  --  115* 103* 101* 93  BUN 23*   < > 27*  --  32* 25* 22* 25*  CREATININE 1.80*   < > 1.56*  --  1.52* 1.28* 1.26* 1.58*  CALCIUM 8.5*   < > 9.2  --  8.8* 9.2 9.1 8.7*  MG 1.9  --   --   --   --  2.0  --   --    < > = values in this interval not displayed.    Liver Function Tests: Recent Labs  Lab 06/12/21 0947  AST 24  ALT 13  ALKPHOS 85  BILITOT 0.6  PROT 7.3  ALBUMIN 2.8*    No  results for input(s): LIPASE, AMYLASE in the last 168 hours. No results for input(s): AMMONIA in the last 168 hours.   CBC: Recent Labs  Lab 06/12/21 0104 06/13/21 0148 06/14/21 0135 06/15/21 0111 06/16/21 0258  WBC 13.7* 12.1* 11.1* 9.8 10.0  HGB 13.6 13.2 13.1 13.0 12.6*  HCT 43.0 41.7 41.5 42.3 40.1  MCV 83.2 84.4 84.5 85.3 83.5  PLT 229 204 208 215 245    INR: Recent Labs  Lab 06/12/21 0104 06/13/21 0148 06/14/21 0135 06/15/21 0111 06/16/21 0258  INR 1.1 1.2 1.1 1.2 1.1    Other results:    Imaging   No results found.   Medications:     Scheduled Medications:  amiodarone  200 mg Oral Daily   atorvastatin  40 mg Oral Daily   Chlorhexidine Gluconate Cloth  6 each Topical Q0600   docusate sodium  200 mg Oral Daily   gabapentin  300 mg Oral BID   losartan  25 mg Oral Daily   magnesium oxide  400 mg Oral BID   mouth rinse  15 mL Mouth Rinse BID    pantoprazole  40 mg Oral Daily   polyethylene glycol  17 g Oral Daily   traZODone  50 mg Oral QHS    Infusions:  sodium chloride Stopped (06/16/21 0643)    ceFAZolin (ANCEF) IV Stopped (06/16/21 0630)    PRN Medications: sodium chloride, acetaminophen, hydrALAZINE, ipratropium-albuterol, ondansetron (ZOFRAN) IV, oxyCODONE, polyethylene glycol, traMADol    Assessment/Plan:    1. Intracranial hemorrhage - CT brain with intracerebral and intraventricular hemorrhage with obstructive hydrocephalus. INR 2.4 - AC reversed with Kcentra/vit k in ER - Underwent bedside ventriculostomy on 2/11 - Initial ventriculostomy drain occluded. CT head 2/17 with increased hemorrhage in right lateral ventricle, slightly increased ventricular dilation.  On 2/18, ventriculostomy drain replaced.  - Still having headaches - Continues to drain. D/w NSU. Will leave drain in for the rest of the week  - With HM-3 device ok to continue to hold [facility] for at least a few more days (or longer) without too much risk. Will start warfarin (no heparin) once drain out   2. Chronic systolic CHF with HM III LVAD implant 04/20/17:  - Has done well with VAD support. - He has been seen at [facility] for transplant referral but has decided not to pursue transplant. No change - New oxygen requirement with pulmonary edema on 2/17 CXR => good diuresis on 2/18.  - Oxygen saturation down to 1L [place].  Now off diuretics. Received IV lasix once yesterday. Scr up slightly today and weight up 2lb. Hold off on further diuresis today.  3. Paroxysmal AFL with RVR - had DC-CV on 10/17/18 with subsequent short episodes of AF after but has been mostly in NSR on low-dose amio (100 daily) - Had recurrent AF in 10/22. Amio increased to 200 bid and converted.  - Remains in NSR. Continue amio 200 daily - No changes   4. h/o UGI AVM bleed - s/p APC x 4 lesions in 2/19 - No recurrent bleeding  - Continue PPI - Off ASA due to GIB.  - Hgb stable at 12.6    5. AKI on CKD Stage IIIa:  - Creatinine baseline 1.3 - 1.7  - Creatinine stabilized 1.26>1.58 after IV lasix yesterday. MAPs stable. Monitor.  6. VAD management - VAD interrogated personally. Parameters stable. - LDH has been stable  - INR goal 2.0-2.5 Off ASA due to GIB 2/19.  - INR 2.4 on a->  now reversed due to [place]. INR 1.1 - MAPs stable 80s - DL site looks good   6. HTN - goal MAP 70-90 with ICH  - losartan increased 2/14 with resultant hypotension and AKI - Hydralazine stopped.  - Losartan decreased to 25 mg daily on 02/18 d/t bump in Scr  7. CAD:   - s/p PCI RCA/PLOM with DES x2 and DES to mild LAD 1/18 - No angina - Continue statin. Off ASA due to GIB   8. Hypomagnesemia. -  supplement as needed.   9. Back and buttock pain - PRN oxycodone  10. Hyponatremia - Received tolvaptan 2/18, Na 134 today - FW restrict     Length of Stay: Mack, LINDSAY N, PA-C 06/16/2021, 9:56 AM  VAD Team --- VAD ISSUES ONLY--- Pager [phone] (7am - 7am)  Advanced Heart Failure Team  Pager [phone] (M-F; 7a - 5p)  Please contact [facility] for night-coverage after hours (5p -7a ) and weekends on [email or web address]  Agree with above. Ventriculostomy drain remains in place. Still with HA. Diuresed with IV lasix but Scr bumped.   MAPs ok   General:  NAD.  HEENT: normal + v-drain Neck: supple. JVP not elevated.  Carotids 2+ bilat; no bruits. No lymphadenopathy or thryomegaly appreciated. Cor: LVAD hum.  Lungs: Clear. Abdomen: obese soft, nontender, non-distended. No hepatosplenomegaly. No bruits or masses. Good bowel sounds. Driveline site clean. Anchor in place.  Extremities: no cyanosis, clubbing, rash. Warm no edema  Neuro: alert & oriented x 3. No focal deficits. Moves all 4 without problem   Remains tenuous. Continues with ventriculostomy drain. Appreciate NSU following. Will keep in for now. Continue to hold [facility]. LDH stable.   Hold diuretics. Watch Scr. VAD interrogated  personally. Parameters stable.   CRITICAL CARE Performed by: Glori Bickers  Total critical care time: 35 minutes  Critical care time was exclusive of separately billable procedures and treating other patients.  Critical care was necessary to treat or prevent imminent or life-threatening deterioration.  Critical care was time spent personally by me (independent of midlevel providers or residents) on the following activities: development of treatment plan with patient and/or surrogate as well as nursing, discussions with consultants, evaluation of patient's response to treatment, examination of patient, obtaining history from patient or surrogate, ordering and performing treatments and interventions, ordering and review of laboratory studies, ordering and review of radiographic studies, pulse oximetry and re-evaluation of patient's condition.  Glori Bickers, MD  11:32 AM

## 2021-06-16 NOTE — Progress Notes (Signed)
Subjective:   The patient is alert and pleasant.  He is in no apparent distress.  His wife and son are at the bedside.  Objective: Vital signs in last 24 hours: Temp:  [97.4 F (36.3 C)-98.9 F (37.2 C)] 98.9 F (37.2 C) (02/22 0700) Pulse Rate:  [30-158] 71 (02/22 1000) Resp:  [11-19] 11 (02/22 1000) BP: (71-119)/(47-101) 97/79 (02/22 1000) SpO2:  [90 %-98 %] 93 % (02/22 1000) Weight:  [85.1 kg] 85.1 kg (02/22 0500) Estimated body mass index is 31.22 kg/m as calculated from the following:   Height as of this encounter: 5\' 5"  (1.651 m).   Weight as of this encounter: 85.1 kg.   Intake/Output from previous day: 02/21 0701 - 02/22 0700 In: 203.6 [I.V.:53.6; IV Piggyback:150] Out: 1340 [Urine:1245; Drains:95] Intake/Output this shift: Total I/O In: 152.7 [P.O.:100; I.V.:2.7; IV Piggyback:50] Out: 154 [Urine:150; Drains:4]  Physical exam the patient is alert and pleasant.  He is moving all 4 extremities.  His ventriculostomy is patent and draining bloody spinal fluid.  Lab Results: Recent Labs    06/15/21 0111 06/16/21 0258  WBC 9.8 10.0  HGB 13.0 12.6*  HCT 42.3 40.1  PLT 215 245   BMET Recent Labs    06/15/21 0111 06/16/21 0258  NA 133* 134*  K 4.1 4.4  CL 95* 95*  CO2 28 29  GLUCOSE 101* 93  BUN 22* 25*  CREATININE 1.26* 1.58*  CALCIUM 9.1 8.7*    Studies/Results: No results found.  Assessment/Plan: Intracerebral hemorrhage, interventricular hemorrhage, hydrocephalus: The patient's ventriculostomy is draining.  It looks like he is going to need it for the rest of the week.  We will try to elevate it again next week.  Hopefully we can avoid a shunt.  I answered all the family's questions.  LOS: 11 days     [name] 06/16/2021, 11:39 AM     Patient ID: David [name], male   DOB: [birth year], 56 y.o.   MRN: [id number]

## 2021-06-17 DIAGNOSIS — I629 Nontraumatic intracranial hemorrhage, unspecified: Secondary | ICD-10-CM | POA: Diagnosis not present

## 2021-06-17 LAB — BASIC METABOLIC PANEL
Anion gap: 9 (ref 5–15)
BUN: 20 mg/dL (ref 6–20)
CO2: 26 mmol/L (ref 22–32)
Calcium: 8.5 mg/dL — ABNORMAL LOW (ref 8.9–10.3)
Chloride: 94 mmol/L — ABNORMAL LOW (ref 98–111)
Creatinine, Ser: 1.62 mg/dL — ABNORMAL HIGH (ref 0.61–1.24)
GFR, Estimated: 49 mL/min — ABNORMAL LOW (ref >60)
Glucose, Bld: 113 mg/dL — ABNORMAL HIGH (ref 70–99)
Potassium: 3.9 mmol/L (ref 3.5–5.1)
Sodium: 129 mmol/L — ABNORMAL LOW (ref 135–145)

## 2021-06-17 LAB — CBC
HCT: 38.2 % — ABNORMAL LOW (ref 39.0–52.0)
Hemoglobin: 12.1 g/dL — ABNORMAL LOW (ref 13.0–17.0)
MCH: 26.4 pg (ref 26.0–34.0)
MCHC: 31.7 g/dL (ref 30.0–36.0)
MCV: 83.2 fL (ref 80.0–100.0)
Platelets: 228 10*3/uL (ref 150–400)
RBC: 4.59 MIL/uL (ref 4.22–5.81)
RDW: 13.6 % (ref 11.5–15.5)
WBC: 9.6 10*3/uL (ref 4.0–10.5)
nRBC: 0 % (ref 0.0–0.2)

## 2021-06-17 LAB — LACTATE DEHYDROGENASE: LDH: 173 U/L (ref 98–192)

## 2021-06-17 LAB — PROTIME-INR
INR: 1 (ref 0.8–1.2)
Prothrombin Time: 13.6 seconds (ref 11.4–15.2)

## 2021-06-17 NOTE — Progress Notes (Signed)
°   06/17/21 1345  Clinical Encounter Type  Visited With Patient not available  Visit Type Follow-up;Critical Care  Referral From Family   Attempted follow-up, Patient Sleeping. Family not present. [address] [place], [place]., [phone].

## 2021-06-17 NOTE — Progress Notes (Signed)
Subjective: The patient is alert and pleasant.  He denies headaches.  Objective: Vital signs in last 24 hours: Temp:  [98 F (36.7 C)-98.9 F (37.2 C)] 98.5 F (36.9 C) (02/22 2339) Pulse Rate:  [31-160] 31 (02/23 0600) Resp:  [11-19] 16 (02/23 0600) BP: (72-108)/(61-98) 94/77 (02/23 0600) SpO2:  [88 %-98 %] 88 % (02/23 0600) Weight:  [84.2 kg] 84.2 kg (02/23 0500) Estimated body mass index is 30.89 kg/m as calculated from the following:   Height as of this encounter: 5\' 5"  (1.651 m).   Weight as of this encounter: 84.2 kg.   Intake/Output from previous day: 02/22 0701 - 02/23 0700 In: 1042.7 [P.O.:940; I.V.:2.7; IV Piggyback:100] Out: 1007 [Urine:905; Drains:102] Intake/Output this shift: No intake/output data recorded.  Physical exam the patient is alert and oriented.  He is moving all 4 extremities well.  His speech is normal.  The patient's ventriculostomy is patent and draining bloody spinal fluid.  Lab Results: Recent Labs    06/16/21 0258 06/17/21 0348  WBC 10.0 9.6  HGB 12.6* 12.1*  HCT 40.1 38.2*  PLT 245 228   BMET Recent Labs    06/16/21 0258 06/17/21 0348  NA 134* 129*  K 4.4 3.9  CL 95* 94*  CO2 29 26  GLUCOSE 93 113*  BUN 25* 20  CREATININE 1.58* 1.62*  CALCIUM 8.7* 8.5*    Studies/Results: No results found.  Assessment/Plan: Intracerebral hemorrhage, interventricular hemorrhage, hydrocephalus: The patient is doing well with his ventriculostomy.  Hopefully we can remove it early next week.  I have answered all his questions.  LOS: 12 days     [name] 06/17/2021, 8:07 AM     Patient ID: David [name], male   DOB: [birth year], 56 y.o.   MRN: [id number]

## 2021-06-17 NOTE — Progress Notes (Signed)
LVAD Coordinator Rounding Note:  Admitted 06/05/21 due to [place] with hydrocephalus to Dr Bensimhon's service. Ventriculostomy placed at bedside by Dr Arnoldo Morale.   HM III LVAD implanted on 04/20/17 by Dr Cyndia Bent under destination therapy criteria.  EVD (external ventricular drain) was replaced x 2 on 06/12/21 by Dr. Zada Finders (Neuro). Ventriculostomy drain remains in and draining. 100 cc out yesterday  Pt in bed this morning awake, answering questions appropriately. Does complain of headache.   Weight down 2 lb. Off diuretics.   Denies dyspnea. Has been up to chair already. Appetite okay.   Vital signs: Temp: 98.9 HR: 72 Doppler Pressure: 81 Automatic BP: 87/67 (76) O2 Sat: 95% on 1L/[place] Wt:182.3>186>186.5>189.6>190.2>197.5>187.6>172>189.6>187.6>185.6 lbs    LVAD interrogation reveals:  Speed: 5400 Flow: 5.2 Power:  4.1w PI: 2.6 Hematocrit: 38  Alarms: none Events: 2 PI  Fixed speed: 5400 Low speed limit: 5100  Drive Line: Dressing C/D/I with anchor intact and accurately applied.  Weekly dressing changes per bedside RN or pt's wife. Next dressing change due: 06/21/21.  Labs:  LDH trend: 166>176>157>162>165>190>205>176>178>174>173  INR trend: 1.0>1.2>1.1>1.2>1.1>1.2>1.1>1.0   Anticoagulation Plan: -INR Goal: 2 - 2.5  (anticoagulation on hold currently due to [place]) -ASA Dose: none due to hx of GI bleed  Device: - Medtronic BiV -Therapies: on- VF 200, VT 167  AT 171 monitored - Last check: 04/29/21   Adverse Events on VAD: - Readmitted 2/19 hgb 3.8. INR 2.9.transfused 4u pRBCs. 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 Call VAD coordinator for any VAD equipment or drive line issues 2. Weekly drive line dressing change per bedside RN or pt's wife. Next dressing change due 06/21/21.  Tanda Rockers RN [place] Coordinator  Office: [phone]  24/7 Pager:  [phone]

## 2021-06-17 NOTE — Progress Notes (Addendum)
Patient ID: David [name], male   DOB: 05-12-1965, 56 y.o.   MRN: [id number] P  Advanced Heart Failure VAD Team Note  PCP-Cardiologist: Glori Bickers, MD   Subjective:    2/11 Presented with [place] 2/11 Underwent bedside ventriculostomy wit NSU 2/12 Repeat head CT with resolution of hydrocephalus. Persistent hematoma 2/17 Repeat CT head with increased hemorrhage in right lateral ventricle, slightly increased ventricular dilation.  The ventriculostomy drain is occluded.  2/18 Ventriculostomy drain replaced  Ventriculostomy drain remains in and draining. 100 cc out yesterday.  Scr 1.26>1.58>1.62. Na 129 today.  Weight down 2 lb. Off diuretics.   MAPs 70s-80s   Denies dyspnea. Has been up to chair already. Appetite okay. Still dealing with intermittent headache.   LVAD INTERROGATION:  HeartMate 3 LVAD:   Flow 4.7 liters/min, speed 5400, power 4, PI 3.7. No PI events. VAD interrogated personally. Parameters stable.  Objective:    Vital Signs:   Temp:  [98.5 F (36.9 C)-98.9 F (37.2 C)] 98.9 F (37.2 C) (02/23 0809) Pulse Rate:  [31-160] 31 (02/23 0600) Resp:  [11-19] 16 (02/23 0600) BP: (78-108)/(64-98) 94/77 (02/23 0600) SpO2:  [88 %-98 %] 88 % (02/23 0600) Weight:  [84.2 kg] 84.2 kg (02/23 0500) Last BM Date : 06/14/21 Mean arterial Pressure 70s-80s  Intake/Output:   Intake/Output Summary (Last 24 hours) at 06/17/2021 0844 Last data filed at 06/17/2021 0700 Gross per 24 hour  Intake 890 ml  Output 953 ml  Net -63 ml     Physical Exam    Physical Exam: GENERAL: Well appearing, male who presents to clinic today in no acute distress. HEENT: normal, + right frontal ventriculostomy NECK: Supple, no JVD.  2+ bilaterally, no bruits.   CARDIAC:  Mechanical heart sounds with LVAD hum present.  LUNGS:  Clear to auscultation bilaterally.  ABDOMEN:  Soft, round, nontender, positive bowel sounds x4.     LVAD exit site: well-healed and incorporated.  Dressing dry and  intact.  No erythema or drainage.  Stabilization device present and accurately applied.  Driveline dressing is being changed daily per sterile technique. EXTREMITIES:  Warm and dry, no cyanosis, clubbing, rash or edema  NEUROLOGIC:  Alert and oriented x 4.  Gait steady.  No aphasia.  No dysarthria.  Affect pleasant.      Telemetry   Sinus rhythm, 70s   Labs   Basic Metabolic Panel: Recent Labs  Lab 06/13/21 0148 06/14/21 0135 06/15/21 0111 06/16/21 0258 06/17/21 0348  NA 131* 137 133* 134* 129*  K 5.1 4.3 4.1 4.4 3.9  CL 95* 99 95* 95* 94*  CO2 28 27 28 29 26   GLUCOSE 115* 103* 101* 93 113*  BUN 32* 25* 22* 25* 20  CREATININE 1.52* 1.28* 1.26* 1.58* 1.62*  CALCIUM 8.8* 9.2 9.1 8.7* 8.5*  MG  --  2.0  --   --   --     Liver Function Tests: Recent Labs  Lab 06/12/21 0947  AST 24  ALT 13  ALKPHOS 85  BILITOT 0.6  PROT 7.3  ALBUMIN 2.8*    No results for input(s): LIPASE, AMYLASE in the last 168 hours. No results for input(s): AMMONIA in the last 168 hours.   CBC: Recent Labs  Lab 06/13/21 0148 06/14/21 0135 06/15/21 0111 06/16/21 0258 06/17/21 0348  WBC 12.1* 11.1* 9.8 10.0 9.6  HGB 13.2 13.1 13.0 12.6* 12.1*  HCT 41.7 41.5 42.3 40.1 38.2*  MCV 84.4 84.5 85.3 83.5 83.2  PLT 204 208 215 245  228    INR: Recent Labs  Lab 06/13/21 0148 06/14/21 0135 06/15/21 0111 06/16/21 0258 06/17/21 0348  INR 1.2 1.1 1.2 1.1 1.0    Other results:    Imaging   No results found.   Medications:     Scheduled Medications:  amiodarone  200 mg Oral Daily   atorvastatin  40 mg Oral Daily   Chlorhexidine Gluconate Cloth  6 each Topical Q0600   docusate sodium  200 mg Oral Daily   gabapentin  300 mg Oral BID   losartan  25 mg Oral Daily   magnesium oxide  400 mg Oral BID   mouth rinse  15 mL Mouth Rinse BID   pantoprazole  40 mg Oral Daily   polyethylene glycol  17 g Oral Daily   traZODone  50 mg Oral QHS    Infusions:  sodium chloride Stopped  (06/16/21 0643)    ceFAZolin (ANCEF) IV 1 g (06/17/21 0659)    PRN Medications: sodium chloride, acetaminophen, hydrALAZINE, ipratropium-albuterol, ondansetron (ZOFRAN) IV, oxyCODONE, polyethylene glycol, traMADol    Assessment/Plan:    1. Intracerebral hemorrhage - CT brain with intracerebral and intraventricular hemorrhage with obstructive hydrocephalus. INR 2.4 - AC reversed with Kcentra/vit k in ER - Underwent bedside ventriculostomy on 2/11 - Initial ventriculostomy drain occluded. CT head 2/17 with increased hemorrhage in right lateral ventricle, slightly increased ventricular dilation.  On 2/18, ventriculostomy drain replaced.  - Still having headaches - Continues to drain. NSU following. Potentially remove drain early next week - With HM-3 device ok to continue to hold [facility] without too much risk. Will start warfarin (no heparin) once drain out   2. Chronic systolic CHF with HM III LVAD implant 04/20/17:  - Has done well with VAD support. - He has been seen at [facility] for transplant referral but has decided not to pursue transplant. No change - New oxygen requirement with pulmonary edema on 2/17 CXR => good diuresis on 2/18.  - Oxygen saturation down to 1L [place].  Now off diuretics. Last received last 02/21. Volume looks okay today. Weight down 2 lb.  3. Paroxysmal AFL with RVR - had DC-CV on 10/17/18 with subsequent short episodes of AF after but has been mostly in NSR on low-dose amio (100 daily) - Had recurrent AF in 10/22. Amio increased to 200 bid and converted.  - Remains in NSR. Continue amio 200 daily - No changes   4. h/o UGI AVM bleed - s/p APC x 4 lesions in 2/19 - No recurrent bleeding  - Continue PPI - Off ASA due to GIB.  - Hgb stable at 12.6   5. AKI on CKD Stage IIIa:  - Creatinine baseline 1.3 - 1.7  - Creatinine stabilized 1.26>1.58 after IV lasix 02/21. Monitor  6. VAD management - VAD interrogated personally. Parameters stable. - LDH has been stable  -  INR goal 2.0-2.5 Off ASA due to GIB 2/19.  - INR 2.4 on a-> now reversed due to [place]. INR 1.0 - MAPs stable 70s-80s - DL site looks good   6. HTN - goal MAP 70-90 with ICH  - losartan increased 2/14 with resultant hypotension and AKI - Hydralazine stopped.  - Losartan decreased to 25 mg daily on 02/18 d/t bump in Scr. Maps stable.  7. CAD:   - s/p PCI RCA/PLOM with DES x2 and DES to mild LAD 1/18 - No angina - Continue statin. Off ASA due to GIB   8. Hypomagnesemia. -  supplement as needed.  9. Hyponatremia - Received tolvaptan 2/18, Na 129 today - FW restrict  - Monitor   Length of Stay: 12  FINCH, LINDSAY N, PA-C 06/17/2021, 8:44 AM  VAD Team --- VAD ISSUES ONLY--- Pager [phone] (7am - 7am)  Advanced Heart Failure Team  Pager [phone] (M-F; 7a - 5p)  Please contact [facility] for night-coverage after hours (5p -7a ) and weekends on [email or web address]  Agree with above. Still draining from ventriculostomy drain. Having HAs. Denies SOB, orthopnea or PND. MAPs 70-80s  General:  NAD.  HEENT: normal  + dressing/drain Neck: supple. JVP not elevated.  Carotids 2+ bilat; no bruits. No lymphadenopathy or thryomegaly appreciated. Cor: LVAD hum.  Lungs: Clear. Abdomen: soft, nontender, non-distended. No hepatosplenomegaly. No bruits or masses. Good bowel sounds. Driveline site clean. Anchor in place.  Extremities: no cyanosis, clubbing, rash. Warm no edema  Neuro: alert & oriented x 3. No focal deficits. Moves all 4 without problem   Continues to drain from ventriculostomy drain. Appreciate NSU following. Drain to remain in place throughout the weekend. Hold AC. Given relative immobility will need to conside low-dose lovenox for DVT prophylaxis and SCDs.  Volume status and MAPs ok. VAD interrogated personally. Parameters stable.  CRITICAL CARE Performed by: Glori Bickers  Total critical care time: 35 minutes  Critical care time was exclusive of separately billable  procedures and treating other patients.  Critical care was necessary to treat or prevent imminent or life-threatening deterioration.  Critical care was time spent personally by me (independent of midlevel providers or residents) on the following activities: development of treatment plan with patient and/or surrogate as well as nursing, discussions with consultants, evaluation of patient's response to treatment, examination of patient, obtaining history from patient or surrogate, ordering and performing treatments and interventions, ordering and review of laboratory studies, ordering and review of radiographic studies, pulse oximetry and re-evaluation of patient's condition.   Glori Bickers, MD  3:44 PM

## 2021-06-18 DIAGNOSIS — I629 Nontraumatic intracranial hemorrhage, unspecified: Secondary | ICD-10-CM | POA: Diagnosis not present

## 2021-06-18 LAB — CBC
HCT: 38.2 % — ABNORMAL LOW (ref 39.0–52.0)
Hemoglobin: 12.4 g/dL — ABNORMAL LOW (ref 13.0–17.0)
MCH: 27.1 pg (ref 26.0–34.0)
MCHC: 32.5 g/dL (ref 30.0–36.0)
MCV: 83.4 fL (ref 80.0–100.0)
Platelets: 217 10*3/uL (ref 150–400)
RBC: 4.58 MIL/uL (ref 4.22–5.81)
RDW: 13.4 % (ref 11.5–15.5)
WBC: 10.3 10*3/uL (ref 4.0–10.5)
nRBC: 0 % (ref 0.0–0.2)

## 2021-06-18 LAB — BASIC METABOLIC PANEL
Anion gap: 6 (ref 5–15)
BUN: 18 mg/dL (ref 6–20)
CO2: 28 mmol/L (ref 22–32)
Calcium: 8.7 mg/dL — ABNORMAL LOW (ref 8.9–10.3)
Chloride: 97 mmol/L — ABNORMAL LOW (ref 98–111)
Creatinine, Ser: 1.4 mg/dL — ABNORMAL HIGH (ref 0.61–1.24)
GFR, Estimated: 59 mL/min — ABNORMAL LOW (ref >60)
Glucose, Bld: 86 mg/dL (ref 70–99)
Potassium: 4 mmol/L (ref 3.5–5.1)
Sodium: 131 mmol/L — ABNORMAL LOW (ref 135–145)

## 2021-06-18 LAB — PROTIME-INR
INR: 1.1 (ref 0.8–1.2)
Prothrombin Time: 13.7 seconds (ref 11.4–15.2)

## 2021-06-18 LAB — LACTATE DEHYDROGENASE: LDH: 171 U/L (ref 98–192)

## 2021-06-18 MED ORDER — SENNA 8.6 MG PO TABS
2.0000 | ORAL_TABLET | Freq: Every day | ORAL | Status: DC
  Administered 2021-06-18 – 2021-06-27 (×7): 17.2 mg via ORAL
  Filled 2021-06-18 (×10): qty 2

## 2021-06-18 NOTE — Progress Notes (Signed)
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/18  Allergies  Allergen Reactions   Plavix [Clopidogrel Bisulfate] Hives    Patient Measurements: Height: 5\' 5"  (165.1 cm) Weight: 86.7 kg (191 lb 2.2 oz) IBW/kg (Calculated) : 61.5   Vital Signs: Temp: 97.8 F (36.6 C) (02/23 2344) Temp Source: Oral (02/23 2344) BP: 102/89 (02/24 0700) Pulse Rate: 35 (02/24 0700)  Labs: Recent Labs    06/16/21 0258 06/17/21 0348 06/18/21 0126  HGB 12.6* 12.1* 12.4*  HCT 40.1 38.2* 38.2*  PLT 245 228 217  LABPROT 14.5 13.6 13.7  INR 1.1 1.0 1.1  CREATININE 1.58* 1.62* 1.40*    Estimated Creatinine Clearance: 59 mL/min (A) (by C-G formula based on SCr of 1.4 mg/dL (H)).   Medical History: Past Medical History:  Diagnosis Date   AICD (automatic cardioverter/defibrillator) present    Anemia    Anxiety    CAD (coronary artery disease) 2009   AMI in 12/2007 with PCI to LAD, staged PCI to  mid/distal RCA, NSTEMI in 02/2009 with BMS to LCx   CHF (congestive heart failure) ([place])    Chronic kidney disease 11/03/2016   stage 3 kidney disease   Dysrhythmia    "arrythmia problems at some point", "fatal rhythms"   History of blood transfusion    "I was bleeding from was rectum" (04/19/2017)   HLD (hyperlipidemia)    HTN (hypertension)    Hyperlipidemia 03/15/2011   Ischemic cardiomyopathy    Admitted in 07/2010 with CHF exacerbation   Myocardial infarction ([place])    "I've had 4" (04/19/2017)   Pneumonia 2018 X 1   Seizures ([place])    one seizure in 04/2016 during cardiac event (04/19/2017)   STEMI 2009 (anterior), 2010 (lateral), and 2012 (inferior) 03/14/2011      Assessment: David [name] with HF with LVAd HM3 implanted 12/18.  He is on warfarin PTA 4mg  on Fridays and 2mg  all other days with last INR pta 2.8.  Admitted with headache found to have [place] > ventriculostomy with ongoing drainage - followed by neurosurgery.   Warfarin reversed with K-centra and IV vitamin K  INR 1.1 hgb stable Anticoagulation now on hold MAPS stable on losartan.   Goal of Therapy:  INR 2-2.5 pta Monitor platelets by anticoagulation protocol: Yes   Plan:  Holding anticoagulation at this time Monitor bleeding and h/h  Follow up plan for oral anticoagulation after resolution of head bleed  Bonnita Nasuti Pharm.D. CPP, BCPS Clinical Pharmacist [phone] 06/18/2021 8:21 AM

## 2021-06-18 NOTE — Progress Notes (Signed)
LVAD Coordinator Rounding Note:  Admitted 06/05/21 due to [place] with hydrocephalus to Dr Bensimhon's service. Ventriculostomy placed at bedside by Dr Arnoldo Morale.   HM III LVAD implanted on 04/20/17 by Dr Cyndia Bent under destination therapy criteria.  EVD (external ventricular drain) was replaced x 2 on 06/12/21 by Dr. Zada Finders (Neuro). Ventriculostomy drain remains in and draining. 100 cc out yesterday  Pt in bed this morning awake, answering questions but slower to respond than previous conversations. Reports neuro MD was here earlier and told him "if I am doing ok tomorrow, he will do the surgery".   Complain of "really bad" headache and requesting pain med. BS nurse notified.   Vital signs: Temp: 98.1 HR: 68 Doppler Pressure: not documented Automatic BP: 102/83 (90) O2 Sat: 96% on 1L/[place] Wt:182.3>186>186.5>189.6>190.2>197.5>187.6>172>189.6>187.6>185.6>191 lbs    LVAD interrogation reveals:  Speed: 5400 Flow: 4.4 Power:  4.0w PI: 5.2 Hematocrit: 38  Alarms: none Events: none  Fixed speed: 5400 Low speed limit: 5100  Drive Line: Dressing C/D/I with anchor intact and accurately applied.  Weekly dressing changes per bedside RN or pt's wife. Next dressing change due: 06/21/21.  Labs:  LDH trend: 166>176>157>162>165>190>205>176>178>174>173>171  INR trend: 1.0>1.2>1.1>1.2>1.1>1.2>1.1>1.0>1.1   Anticoagulation Plan: -INR Goal: 2 - 2.5  (anticoagulation on hold currently due to [place]) -ASA Dose: none due to hx of GI bleed  Device: - Medtronic BiV -Therapies: on- VF 200, VT 167  AT 171 monitored - Last check: 04/29/21   Adverse Events on VAD: - Readmitted 2/19 hgb 3.8. INR 2.9.transfused 4u pRBCs. 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 Call VAD coordinator for any VAD equipment or drive line issues 2. Weekly drive line dressing change per bedside RN or pt's  wife. Next dressing change due 06/21/21.  Zada Girt RN, [place] Coordinator  Office: [phone]  24/7 Pager: [phone]

## 2021-06-18 NOTE — Progress Notes (Signed)
EVD with 0 drainage x 2 hours. No pulsatility observed in line. Pt A&O with no neuro changes.Mild confusion is at baseline. Dr. Glenford Peers notified. Will continue to assess for changes.

## 2021-06-18 NOTE — Progress Notes (Signed)
Occupational Therapy Treatment Patient Details Name: David [name] MRN: [id number] DOB: 18-Apr-1966 Today's Date: 06/18/2021   History of present illness 56 y.o. male who presented 06/05/21 with worsening HA. CT showed right basal ganglia hemorrhage, intraventricular hemorrhage and obstructive hydrocephalus. S/p placement of right frontal ventriculostomy via bur hole 2/11. 2/12 Repeat head CT with resolution of hydrocephalus. PMH: ICD, HfrEF, HLD, HTN, s/p LVAD HM-III 04/20/18, CAD, CKD, MI, seizures   OT comments  Pt continues to present with decreased balance and cognition. Very motivated to participate in therapy. Focused session on activity which required problem solving, sequencing, error recognition, and awareness. Pt participating in three part therapeutic activity to challenge balance and cognition including reaching and sorting sticky notes by number and letter. By attempt three, Pt requiring Min-Mod A for L lateral lean. Able to recognize and verbalize that he was tired stating "my legs are tired". Wife, Joseph Art, practicing changing to/from LVAD batteries - requiring Mod cues. Will continue to follow acutely as admitted and continue to recommend dc to home with [place].    Recommendations for follow up therapy are one component of a multi-disciplinary discharge planning process, led by the attending physician.  Recommendations may be updated based on patient status, additional functional criteria and insurance authorization.    Follow Up Recommendations  Home health OT    Assistance Recommended at Discharge Frequent or constant Supervision/Assistance  Patient can return home with the following  A little help with walking and/or transfers;A little help with bathing/dressing/bathroom;Direct supervision/assist for medications management;Direct supervision/assist for financial management;Other (comment)   Equipment Recommendations  None recommended by OT    Recommendations for Other Services  PT consult;Speech consult    Precautions / Restrictions Precautions Precautions: Fall;Other (comment) Precaution Comments: LVAD (x4 year hx), EVD (clamp prior to mobilizing) Restrictions Weight Bearing Restrictions: No       Mobility Bed Mobility               General bed mobility comments: In recliner upon arrival    Transfers Overall transfer level: Needs assistance Equipment used: Rolling walker (2 wheels) Transfers: Sit to/from Stand Sit to Stand: Min guard           General transfer comment: Min guard to come to stand     Balance Overall balance assessment: Needs assistance Sitting-balance support: No upper extremity supported, Feet supported Sitting balance-Leahy Scale: Fair     Standing balance support: During functional activity, Bilateral upper extremity supported, Reliant on assistive device for balance Standing balance-Leahy Scale: Fair                             ADL either performed or assessed with clinical judgement   ADL                                         General ADL Comments: Focused session on activity for balance and cognition; see exercises session as well as Joseph Art (wife) practicing LVAD changes. Renee requiring Mod cues for changing from battery to/from [facility] power. Noting Renee becoming very nervous and benefiting from cues to slow down and remain calm.    Extremity/Trunk Assessment Upper Extremity Assessment Upper Extremity Assessment: Overall WFL for tasks assessed   Lower Extremity Assessment Lower Extremity Assessment: Defer to PT evaluation        Vision  Perception     Praxis      Cognition Arousal/Alertness: Awake/alert Behavior During Therapy: Flat affect Overall Cognitive Status: Impaired/Different from baseline Area of Impairment: Attention, Memory, Following commands, Safety/judgement, Awareness, Problem solving                   Current Attention Level:  Sustained Memory: Decreased recall of precautions, Decreased short-term memory Following Commands: Follows one step commands with increased time, Follows one step commands consistently Safety/Judgement: Decreased awareness of safety, Decreased awareness of deficits Awareness: Intellectual Problem Solving: Slow processing, Decreased initiation, Difficulty sequencing, Requires verbal cues, Requires tactile cues General Comments: Decreased insight into deficits. focused session on activity which required problem solving, sequencing, error recognition, and awareness. Requiring cues throughout        Exercises Exercises: Other exercises Other Exercises Other Exercises: Three part activity to challenge balance and cognition. Sticky notes on wall within reach that had numbers (1-5) and letters (A-F). Pt first havign to reach and place the different sticky notes in two different piles. Second time, picking only numbers first and then letters. Minimal difficulty and requiring cues. Third, collecting each but in reverse order. Moderate diffciulty with reverse order and requiring cues for error recognition. Performing activity on fall risk matt for change in flooring nad challenge balance. By attempt three, Pt requiring min-Mod A for L lateral lean. Able to recognize and verbalize that he was tired which is an improvement. unable to state how the tas kwas difficult for his balance or cognition besides "my legs are tired"    Shoulder Instructions       General Comments Wife present throughout    Pertinent Vitals/ Pain       Pain Assessment Pain Assessment: Faces Faces Pain Scale: No hurt Pain Intervention(s): Monitored during session  Home Living                                          Prior Functioning/Environment              Frequency  Min 3X/week        Progress Toward Goals  OT Goals(current goals can now be found in the care plan section)  Progress towards  OT goals: Progressing toward goals  Acute Rehab OT Goals OT Goal Formulation: With patient/family Time For Goal Achievement: 06/21/21 Potential to Achieve Goals: Good ADL Goals Pt Will Perform Grooming: with modified independence;standing Pt Will Transfer to Toilet: with modified independence;ambulating;regular height toilet Additional ADL Goal #1: Pt will perform three part path finding task with Min cues Additional ADL Goal #2: Pt will manage LVAD batteries with Min cues  Plan Discharge plan remains appropriate    Co-evaluation                 AM-PAC OT "6 Clicks" Daily Activity     Outcome Measure   Help from another person eating meals?: A Little Help from another person taking care of personal grooming?: A Little Help from another person toileting, which includes using toliet, bedpan, or urinal?: A Little Help from another person bathing (including washing, rinsing, drying)?: A Lot Help from another person to put on and taking off regular upper body clothing?: A Lot Help from another person to put on and taking off regular lower body clothing?: A Lot 6 Click Score: 15    End of Session    OT  Visit Diagnosis: Cognitive communication deficit (R41.841)   Activity Tolerance Patient tolerated treatment well   Patient Left in chair;with call bell/phone within reach   Nurse Communication Mobility status        Time: 7846-9629 OT Time Calculation (min): 42 min  Charges: OT General Charges $OT Visit: 1 Visit OT Treatments $Therapeutic Activity: 8-22 mins $Cognitive Funtion inital: Initial 15 mins $Cognitive Funtion additional: Additional15 mins  David [name] MSOT, OTR/L Acute Rehab Pager: [phone] Office: [place] 06/18/2021, 6:20 PM

## 2021-06-18 NOTE — Progress Notes (Signed)
Physical Therapy Treatment Patient Details Name: David [name] MRN: [id number] DOB: 01/20/1966 Today's Date: 06/18/2021   History of Present Illness 56 y.o. male who presented 06/05/21 with worsening HA. CT showed right basal ganglia hemorrhage, intraventricular hemorrhage and obstructive hydrocephalus. S/p placement of right frontal ventriculostomy via bur hole 2/11. 2/12 Repeat head CT with resolution of hydrocephalus. PMH: ICD, HfrEF, HLD, HTN, s/p LVAD HM-III 04/20/18, CAD, CKD, MI, seizures    PT Comments    Pt continues to display L leg weakness, tendency to lean and drift to L, and L inattention that impacts his safety with mobility. Initially, pt was able to ambulate with min guard assist using a RW, but as he fatigued he began to lean and drift more to the L with noted L knee instability and poor L foot placement behind and lateral to the RW. Attempted to facilitate problem-solving through cuing pt to use his vision to see and acknowledge the placement that needed correcting, but pt unable to identify it and correct it without extensive cues. Pt did recall his room number and was able to problem-solve how to pathfind his way back to it with mod cues though. Will continue to follow acutely. Current recommendations remain appropriate.    Recommendations for follow up therapy are one component of a multi-disciplinary discharge planning process, led by the attending physician.  Recommendations may be updated based on patient status, additional functional criteria and insurance authorization.  Follow Up Recommendations  Home health PT     Assistance Recommended at Discharge Frequent or constant Supervision/Assistance  Patient can return home with the following A little help with bathing/dressing/bathroom;Assistance with cooking/housework;Direct supervision/assist for financial management;Direct supervision/assist for medications management;Assist for transportation;A little help with walking  and/or transfers;Help with stairs or ramp for entrance   Equipment Recommendations  Rolling walker (2 wheels);BSC/3in1    Recommendations for Other Services       Precautions / Restrictions Precautions Precautions: Fall;Other (comment) Precaution Comments: LVAD (x4 year hx), EVD (clamp prior to mobilizing) Restrictions Weight Bearing Restrictions: No     Mobility  Bed Mobility Overal bed mobility: Needs Assistance Bed Mobility: Sit to Supine       Sit to supine: Min guard   General bed mobility comments: Min guard with return to supine for safety and line management.    Transfers Overall transfer level: Needs assistance Equipment used: Rolling walker (2 wheels) Transfers: Sit to/from Stand Sit to Stand: Min guard           General transfer comment: Min guard to come to stand from recliner to RW.    Ambulation/Gait Ambulation/Gait assistance: Min assist, +2 safety/equipment, Min guard Gait Distance (Feet): 220 Feet Assistive device: Rolling walker (2 wheels) Gait Pattern/deviations: Step-through pattern, Decreased stride length, Decreased step length - left, Drifts right/left (drifts and leans L, more so as he fatigues) Gait velocity: decreased Gait velocity interpretation: <1.31 ft/sec, indicative of household ambulator   General Gait Details: Pt initially ambulating with min guard assist and just slightly more so on the L side of the RW than in the middle, staying inside the RW even when turning. Did note decreased L step length. As pt fatigued, he leaned and drifted more to his L, stepping his L foot posterior and sometimes lateral to the L back leg of the RW. Cued pt to look at his L foot and try to facilitate him to correct the foot placement but needed cues to problem-solve how to correct. Pt also with increased  turnk and L knee flexion with instability noted as he fatigued, needing up to minA for stability. +2 for line management   Stairs              Wheelchair Mobility    Modified Rankin (Stroke Patients Only) Modified Rankin (Stroke Patients Only) Pre-Morbid Rankin Score: No symptoms Modified Rankin: Moderately severe disability     Balance Overall balance assessment: Needs assistance Sitting-balance support: No upper extremity supported, Feet supported Sitting balance-Leahy Scale: Fair     Standing balance support: During functional activity, Bilateral upper extremity supported, Reliant on assistive device for balance Standing balance-Leahy Scale: Poor Standing balance comment: Reliant on UE support for mobility                            Cognition Arousal/Alertness: Awake/alert Behavior During Therapy: Flat affect Overall Cognitive Status: Impaired/Different from baseline Area of Impairment: Attention, Memory, Following commands, Safety/judgement, Awareness, Problem solving                   Current Attention Level: Sustained Memory: Decreased recall of precautions, Decreased short-term memory Following Commands: Follows one step commands with increased time, Follows one step commands consistently Safety/Judgement: Decreased awareness of safety, Decreased awareness of deficits Awareness: Intellectual Problem Solving: Slow processing, Decreased initiation, Difficulty sequencing, Requires verbal cues, Requires tactile cues General Comments: Pt with poor insight into his deficits, not realizing he was drifting to the L despite multiple attemps to cue him to look and identify the issue and problem-solve how to fix it. Needs repeated reminders and step-by-step cues for problem-solving. Pt was able to identify his L leg getting weaker though as he fatigued. Able to recall his room number and find his room with mod cues to problem-solve if one direction does not have more doors or goes down in numbers he needs to try another route.        Exercises      General Comments        Pertinent  Vitals/Pain Pain Assessment Pain Assessment: Faces Faces Pain Scale: No hurt Pain Intervention(s): Monitored during session    Home Living                          Prior Function            PT Goals (current goals can now be found in the care plan section) Acute Rehab PT Goals Patient Stated Goal: to improve PT Goal Formulation: With patient/family Time For Goal Achievement: 07/02/21 Potential to Achieve Goals: Good Progress towards PT goals: Progressing toward goals    Frequency    Min 3X/week      PT Plan Equipment recommendations need to be updated    Co-evaluation              AM-PAC PT "6 Clicks" Mobility   Outcome Measure  Help needed turning from your back to your side while in a flat bed without using bedrails?: A Little Help needed moving from lying on your back to sitting on the side of a flat bed without using bedrails?: A Little Help needed moving to and from a bed to a chair (including a wheelchair)?: A Little Help needed standing up from a chair using your arms (e.g., wheelchair or bedside chair)?: A Little Help needed to walk in hospital room?: A Little Help needed climbing 3-5 steps with a railing? : A Lot 6  Click Score: 17    End of Session Equipment Utilized During Treatment: Oxygen Activity Tolerance: Patient tolerated treatment well Patient left: with call bell/phone within reach;with family/visitor present;in bed;with bed alarm set;with nursing/sitter in room Nurse Communication: Mobility status PT Visit Diagnosis: Unsteadiness on feet (R26.81);Other abnormalities of gait and mobility (R26.89);Difficulty in walking, not elsewhere classified (R26.2)     Time: 7673-4193 PT Time Calculation (min) (ACUTE ONLY): 28 min  Charges:  $Gait Training: 8-22 mins $Therapeutic Activity: 8-22 mins                     Moishe Spice, PT, DPT Acute Rehabilitation Services  Pager: [phone] Office: [place] 06/18/2021, 5:19 PM

## 2021-06-18 NOTE — Progress Notes (Addendum)
Patient ID: David [name], male   DOB: November 22, 1965, 56 y.o.   MRN: [id number] P  Advanced Heart Failure VAD Team Note  PCP-Cardiologist: Glori Bickers, MD   Subjective:    2/11 Presented with [place] 2/11 Underwent bedside ventriculostomy wit NSU 2/12 Repeat head CT with resolution of hydrocephalus. Persistent hematoma 2/17 Repeat CT head with increased hemorrhage in right lateral ventricle, slightly increased ventricular dilation.  The ventriculostomy drain is occluded.  2/18 Ventriculostomy drain replaced  Today ventriculostomy is not draining. NSU evaluated.    Complaining of headache 5/10. Oriented but thought his wife was in the room.    LVAD INTERROGATION:  HeartMate 3 LVAD:   Flow 4.7 liters/min, speed 5400, power 4, PI 3.3 . No PI events. VAD interrogated personally. Parameters stable.  Objective:    Vital Signs:   Temp:  [97.8 F (36.6 C)-98.9 F (37.2 C)] 98.1 F (36.7 C) (02/24 0839) Pulse Rate:  [30-134] 69 (02/24 0900) Resp:  [10-16] 16 (02/24 0900) BP: (73-116)/(50-89) 102/83 (02/24 0800) SpO2:  [84 %-97 %] 96 % (02/24 0900) Weight:  [86.7 kg] 86.7 kg (02/24 0600) Last BM Date : 06/14/21 Mean arterial Pressure  80-90s   Intake/Output:   Intake/Output Summary (Last 24 hours) at 06/18/2021 0956 Last data filed at 06/18/2021 0900 Gross per 24 hour  Intake 500 ml  Output 1024 ml  Net -524 ml     Physical Exam   Physical Exam: GENERAL: No acute distress. HEENT: Ventriculostomy   NECK: Supple, JVP 5-6   .  2+ bilaterally, no bruits.  No lymphadenopathy or thyromegaly appreciated.   CARDIAC:  Mechanical heart sounds with LVAD hum present.  LUNGS:  Clear to auscultation bilaterally.  ABDOMEN:  Soft, round, nontender, positive bowel sounds x4.     LVAD exit site:   Dressing dry and intact.  No erythema or drainage.  Stabilization device present and accurately applied.  Driveline dressing is being changed daily per sterile technique. EXTREMITIES:  Warm and  dry, no cyanosis, clubbing, rash or edema  NEUROLOGIC:  Alert and oriented x 3.    No aphasia.  No dysarthria.  Affect pleasant.      Telemetry   SR 60s personally reviewed.   Labs   Basic Metabolic Panel: Recent Labs  Lab 06/14/21 0135 06/15/21 0111 06/16/21 0258 06/17/21 0348 06/18/21 0126  NA 137 133* 134* 129* 131*  K 4.3 4.1 4.4 3.9 4.0  CL 99 95* 95* 94* 97*  CO2 27 28 29 26 28   GLUCOSE 103* 101* 93 113* 86  BUN 25* 22* 25* 20 18  CREATININE 1.28* 1.26* 1.58* 1.62* 1.40*  CALCIUM 9.2 9.1 8.7* 8.5* 8.7*  MG 2.0  --   --   --   --     Liver Function Tests: Recent Labs  Lab 06/12/21 0947  AST 24  ALT 13  ALKPHOS 85  BILITOT 0.6  PROT 7.3  ALBUMIN 2.8*    No results for input(s): LIPASE, AMYLASE in the last 168 hours. No results for input(s): AMMONIA in the last 168 hours.   CBC: Recent Labs  Lab 06/14/21 0135 06/15/21 0111 06/16/21 0258 06/17/21 0348 06/18/21 0126  WBC 11.1* 9.8 10.0 9.6 10.3  HGB 13.1 13.0 12.6* 12.1* 12.4*  HCT 41.5 42.3 40.1 38.2* 38.2*  MCV 84.5 85.3 83.5 83.2 83.4  PLT 208 215 245 228 217    INR: Recent Labs  Lab 06/14/21 0135 06/15/21 0111 06/16/21 0258 06/17/21 0348 06/18/21 0126  INR 1.1  1.2 1.1 1.0 1.1    Other results:    Imaging   No results found.   Medications:     Scheduled Medications:  amiodarone  200 mg Oral Daily   atorvastatin  40 mg Oral Daily   Chlorhexidine Gluconate Cloth  6 each Topical Q0600   docusate sodium  200 mg Oral Daily   gabapentin  300 mg Oral BID   losartan  25 mg Oral Daily   magnesium oxide  400 mg Oral BID   mouth rinse  15 mL Mouth Rinse BID   pantoprazole  40 mg Oral Daily   polyethylene glycol  17 g Oral Daily   senna  2 tablet Oral QHS   traZODone  50 mg Oral QHS    Infusions:  sodium chloride Stopped (06/16/21 0643)    ceFAZolin (ANCEF) IV 1 g (06/18/21 0520)    PRN Medications: sodium chloride, acetaminophen, hydrALAZINE, ipratropium-albuterol,  ondansetron (ZOFRAN) IV, oxyCODONE, polyethylene glycol, traMADol    Assessment/Plan:    1. Intracerebral hemorrhage - CT brain with intracerebral and intraventricular hemorrhage with obstructive hydrocephalus. INR 2.4 - AC reversed with Kcentra/vit k in ER - Underwent bedside ventriculostomy on 2/11 - Initial ventriculostomy drain occluded. CT head 2/17 with increased hemorrhage in right lateral ventricle, slightly increased ventricular dilation.  On 2/18, ventriculostomy drain replaced.  Today ventriculostomy is not draining. NSU plans to repeat CT tomorrow.  - No AC for now.    2. Chronic systolic CHF with HM III LVAD implant 04/20/17:  - Has done well with VAD support. - He has been seen at [facility] for transplant referral but has decided not to pursue transplant. No change - New oxygen requirement with pulmonary edema on 2/17 CXR => good diuresis on 2/18.  - LDH stable. INR 1.1. No AC as above.  - Volume status stable.   3. Paroxysmal AFL with RVR - had DC-CV on 10/17/18 with subsequent short episodes of AF after but has been mostly in NSR on low-dose amio (100 daily) - Had recurrent AF in 10/22.  - Continue amio 200 mg twice a day.   - Continue amio 200 daily - No AC   4. h/o UGI AVM bleed - s/p APC x 4 lesions in 2/19 - No recurrent bleeding  - Continue PPI - Off ASA due to GIB.  - Hgb stable at 12.4    5. AKI on CKD Stage IIIa:  - Creatinine baseline 1.3 - 1.7  - Creatinine stabilized at 1.4.   6. VAD management - VAD interrogated personally. Parameters stable. - LDH has been stable  - INR goal 2.0-2.5 Off ASA due to GIB 2/19.  - INR 2.4 reversed due to [place]. INR 1.1  - MAPs stable 80-90s  - DL site looks good   6. HTN - goal MAP 70-90 with ICH  - losartan increased 2/14 with resultant hypotension and AKI - Hydralazine stopped.  - Losartan decreased to 25 mg daily on 02/18 d/t bump in Scr.  - Maps stable.   7. CAD:   - s/p PCI RCA/PLOM with DES x2 and DES to  mild LAD 1/18 - No angina - Continue statin. Off ASA due to GIB   8. Hypomagnesemia. -  supplement as needed.   9. Hyponatremia - Received tolvaptan 2/18, Na 131 today -Follow daily.   Length of Stay: [place], NP 06/18/2021, 9:56 AM  VAD Team --- VAD ISSUES ONLY--- Pager [phone] (7am - 7am)  Advanced Heart Failure  Team  Pager [phone] (M-F; 7a - 5p)  Please contact [facility] for night-coverage after hours (5p -7a ) and weekends on [email or web address]   Agree with above.   C/o HA. Ventriculostomy drain is occluded. NSU following. Volume status ok. Denies CP or SOB. Seems a bit confused at times. MAPs ok   General:  NAD.  HEENT: normal  + drain/dressing Neck: supple. JVP not elevated.  Carotids 2+ bilat; no bruits. No lymphadenopathy or thryomegaly appreciated. Cor: LVAD hum.  Lungs: Clear. Abdomen: obese soft, nontender, non-distended. No hepatosplenomegaly. No bruits or masses. Good bowel sounds. Driveline site clean. Anchor in place.  Extremities: no cyanosis, clubbing, rash. Warm no edema  Neuro: alert & oriented x 3. No focal deficits. Moves all 4 without problem   It appears that ventriculostomy drain is occluded. D/w NSU and plan is to follow him clinically today and repeat CT scan tomorrow (sooner if clinical course changes). Remains off all AC. Needs DVT prophylaxis.   MAPs and volume status ok.   VAD interrogated personally. Parameters stable.  CRITICAL CARE Performed by: Glori Bickers  Total critical care time: 35 minutes  Critical care time was exclusive of separately billable procedures and treating other patients.  Critical care was necessary to treat or prevent imminent or life-threatening deterioration.  Critical care was time spent personally by me (independent of midlevel providers or residents) on the following activities: development of treatment plan with patient and/or surrogate as well as nursing, discussions with consultants, evaluation  of patient's response to treatment, examination of patient, obtaining history from patient or surrogate, ordering and performing treatments and interventions, ordering and review of laboratory studies, ordering and review of radiographic studies, pulse oximetry and re-evaluation of patient's condition.  Glori Bickers, MD  10:45 AM

## 2021-06-18 NOTE — Progress Notes (Signed)
Subjective: The patient is alert and pleasant.  He looks well.  He has no complaints.  Objective: Vital signs in last 24 hours: Temp:  [97.8 F (36.6 C)-98.9 F (37.2 C)] 97.8 F (36.6 C) (02/23 2344) Pulse Rate:  [30-134] 35 (02/24 0700) Resp:  [10-20] 16 (02/24 0700) BP: (73-116)/(50-89) 102/89 (02/24 0700) SpO2:  [84 %-97 %] 95 % (02/24 0700) Weight:  [86.7 kg] 86.7 kg (02/24 0600) Estimated body mass index is 31.81 kg/m as calculated from the following:   Height as of this encounter: 5\' 5"  (1.651 m).   Weight as of this encounter: 86.7 kg.   Intake/Output from previous day: 02/23 0701 - 02/24 0700 In: 600 [P.O.:600] Out: 976 [Urine:905; Drains:71] Intake/Output this shift: No intake/output data recorded.  Physical exam the patient is alert and oriented.  His speech and strength is normal.  The patient's ventriculostomy appears occluded.  Lab Results: Recent Labs    06/17/21 0348 06/18/21 0126  WBC 9.6 10.3  HGB 12.1* 12.4*  HCT 38.2* 38.2*  PLT 228 217   BMET Recent Labs    06/17/21 0348 06/18/21 0126  NA 129* 131*  K 3.9 4.0  CL 94* 97*  CO2 26 28  GLUCOSE 113* 86  BUN 20 18  CREATININE 1.62* 1.40*  CALCIUM 8.5* 8.7*    Studies/Results: No results found.  Assessment/Plan: Intracerebral hemorrhage, hydrocephalus: The patient's ventriculostomy does not seem to be functioning.  He is doing well clinically.  We will observe him and plan to repeat his CAT scan tomorrow.  If he continues to do well clinically and his CAT scan does not show increasing ventriculomegaly, perhaps we can remove it tomorrow.  LOS: 13 days     David [name] 06/18/2021, 7:52 AM     Patient ID: David [name], male   DOB: 08-29-1965, 56 y.o.   MRN: [id number]

## 2021-06-19 ENCOUNTER — Inpatient Hospital Stay (HOSPITAL_COMMUNITY): Payer: Medicare HMO

## 2021-06-19 DIAGNOSIS — I629 Nontraumatic intracranial hemorrhage, unspecified: Secondary | ICD-10-CM | POA: Diagnosis not present

## 2021-06-19 LAB — CBC
HCT: 39.9 % (ref 39.0–52.0)
Hemoglobin: 12.6 g/dL — ABNORMAL LOW (ref 13.0–17.0)
MCH: 26.3 pg (ref 26.0–34.0)
MCHC: 31.6 g/dL (ref 30.0–36.0)
MCV: 83.1 fL (ref 80.0–100.0)
Platelets: 224 10*3/uL (ref 150–400)
RBC: 4.8 MIL/uL (ref 4.22–5.81)
RDW: 13.4 % (ref 11.5–15.5)
WBC: 12.2 10*3/uL — ABNORMAL HIGH (ref 4.0–10.5)
nRBC: 0 % (ref 0.0–0.2)

## 2021-06-19 LAB — PROTIME-INR
INR: 1 (ref 0.8–1.2)
Prothrombin Time: 13.6 seconds (ref 11.4–15.2)

## 2021-06-19 LAB — BASIC METABOLIC PANEL
Anion gap: 9 (ref 5–15)
BUN: 11 mg/dL (ref 6–20)
CO2: 25 mmol/L (ref 22–32)
Calcium: 8.7 mg/dL — ABNORMAL LOW (ref 8.9–10.3)
Chloride: 96 mmol/L — ABNORMAL LOW (ref 98–111)
Creatinine, Ser: 1.35 mg/dL — ABNORMAL HIGH (ref 0.61–1.24)
GFR, Estimated: >60 mL/min (ref >60)
Glucose, Bld: 113 mg/dL — ABNORMAL HIGH (ref 70–99)
Potassium: 4 mmol/L (ref 3.5–5.1)
Sodium: 130 mmol/L — ABNORMAL LOW (ref 135–145)

## 2021-06-19 LAB — MAGNESIUM: Magnesium: 1.7 mg/dL (ref 1.7–2.4)

## 2021-06-19 LAB — LACTATE DEHYDROGENASE: LDH: 179 U/L (ref 98–192)

## 2021-06-19 MED ORDER — ALBUMIN HUMAN 5 % IV SOLN: 12.5000 g | Freq: Once | INTRAVENOUS | Status: DC

## 2021-06-19 MED ORDER — MAGNESIUM SULFATE 4 GM/100ML IV SOLN
4.0000 g | Freq: Once | INTRAVENOUS | Status: AC
  Administered 2021-06-19: 4 g via INTRAVENOUS
  Filled 2021-06-19: qty 100

## 2021-06-19 MED ORDER — ALBUMIN HUMAN 5 % IV SOLN: 12.5000 g | Freq: Once | INTRAVENOUS | Status: AC

## 2021-06-19 MED ORDER — BUTALBITAL-APAP-CAFFEINE 50-325-40 MG PO TABS
2.0000 | ORAL_TABLET | ORAL | Status: DC | PRN
  Administered 2021-06-19 – 2021-06-24 (×8): 2 via ORAL
  Filled 2021-06-19 (×8): qty 2

## 2021-06-19 MED ORDER — ALBUMIN HUMAN 5 % IV SOLN
INTRAVENOUS | Status: AC
  Administered 2021-06-19: 12.5 g via INTRAVENOUS
  Filled 2021-06-19: qty 250

## 2021-06-19 NOTE — Progress Notes (Signed)
RN called into room by patient with c/o worsening headache, pain 5/10. Pt states the headache is posterior and "it reminds me from when I was bleeding". No changes in neuro status noted from this RNs initial to most recent exam. RN paged Dr Kathyrn Sheriff with [facility], and no new orders we given considering neuro status has not changed. RN gave Tramadol for pain and will closely monitor for any acute changes in neuro status.

## 2021-06-19 NOTE — Progress Notes (Signed)
°  NEUROSURGERY PROGRESS NOTE   No issues overnight with the exception of some HA. No new complaints this am, unchanged HA.  EXAM:  BP 94/78    Pulse 62    Temp (!) 97.5 F (36.4 C) (Oral)    Resp 13    Ht 5\' 5"  (1.651 m)    Wt 85.6 kg    SpO2 95%    BMI 31.40 kg/m   Awake, alert, oriented x3 Speech fluent, appropriate  CN grossly intact  5/5 BUE/BLE  EVD non-functional  IMAGING: CTH this am reviewed, demonstrates slightly improved ventriculomegaly compared to prior CT.   IMPRESSION:  56 y.o. male s/p IVH, appears neurologically stable with functionally occluded EVD x 24hrs and reassuring CT.  PLAN: - EVD d/c'ed at bedside this am - Cont supportive care per cardiology   Consuella Lose, MD [facility]

## 2021-06-19 NOTE — Progress Notes (Signed)
Patient ID: David [name], male   DOB: 11/23/1965, 56 y.o.   MRN: [id number] P  Advanced Heart Failure VAD Team Note  PCP-Cardiologist: Glori Bickers, MD   Subjective:    2/11 Presented with [place] 2/11 Underwent bedside ventriculostomy wit NSU 2/12 Repeat head CT with resolution of hydrocephalus. Persistent hematoma 2/17 Repeat CT head with increased hemorrhage in right lateral ventricle, slightly increased ventricular dilation.  The ventriculostomy drain is occluded.  2/18 Ventriculostomy drain replaced 2/25 CT improved ventriculomegaly. Drain pulled   CT this am with slightly reduced ventriculomegaly in setting of occluded drain.   Drain pulled this am. Says HA is some worse. No other neurologic symptoms. Alert and oriented.    LVAD INTERROGATION:  HeartMate 3 LVAD:   Flow 4.7 liters/min, speed 5400, power 4.2, PI 5.2 . VAD interrogated personally. Parameters stable.  Objective:    Vital Signs:   Temp:  [97.5 F (36.4 C)-98.1 F (36.7 C)] 98 F (36.7 C) (02/25 1137) Pulse Rate:  [56-99] 61 (02/25 1400) Resp:  [12-27] 21 (02/25 1400) BP: (87-127)/(62-104) 91/70 (02/25 1400) SpO2:  [90 %-99 %] 96 % (02/25 1400) Weight:  [85.6 kg] 85.6 kg (02/25 0500) Last BM Date : 06/18/21 Mean arterial Pressure  80-90s   Intake/Output:   Intake/Output Summary (Last 24 hours) at 06/19/2021 1501 Last data filed at 06/19/2021 1400 Gross per 24 hour  Intake 1177.81 ml  Output 1245 ml  Net -67.19 ml      Physical Exam   General:  Sitting in chair  HEENT: normal  (several stitches in scalp) Neck: supple. JVP not elevated.  Carotids 2+ bilat; no bruits. No lymphadenopathy or thryomegaly appreciated. Cor: LVAD hum.  Lungs: Clear. Abdomen: soft, nontender, non-distended. No hepatosplenomegaly. No bruits or masses. Good bowel sounds. Driveline site clean. Anchor in place.  Extremities: no cyanosis, clubbing, rash. Warm no edema  Neuro: alert & oriented x 3. No focal deficits. Moves  all 4 without problem    Telemetry   SR 60 Personally reviewed  Labs   Basic Metabolic Panel: Recent Labs  Lab 06/14/21 0135 06/15/21 0111 06/16/21 0258 06/17/21 0348 06/18/21 0126 06/19/21 0138  NA 137 133* 134* 129* 131* 130*  K 4.3 4.1 4.4 3.9 4.0 4.0  CL 99 95* 95* 94* 97* 96*  CO2 27 28 29 26 28 25   GLUCOSE 103* 101* 93 113* 86 113*  BUN 25* 22* 25* 20 18 11   CREATININE 1.28* 1.26* 1.58* 1.62* 1.40* 1.35*  CALCIUM 9.2 9.1 8.7* 8.5* 8.7* 8.7*  MG 2.0  --   --   --   --  1.7     Liver Function Tests: No results for input(s): AST, ALT, ALKPHOS, BILITOT, PROT, ALBUMIN in the last 168 hours.   No results for input(s): LIPASE, AMYLASE in the last 168 hours. No results for input(s): AMMONIA in the last 168 hours.   CBC: Recent Labs  Lab 06/15/21 0111 06/16/21 0258 06/17/21 0348 06/18/21 0126 06/19/21 0138  WBC 9.8 10.0 9.6 10.3 12.2*  HGB 13.0 12.6* 12.1* 12.4* 12.6*  HCT 42.3 40.1 38.2* 38.2* 39.9  MCV 85.3 83.5 83.2 83.4 83.1  PLT 215 245 228 217 224     INR: Recent Labs  Lab 06/15/21 0111 06/16/21 0258 06/17/21 0348 06/18/21 0126 06/19/21 0138  INR 1.2 1.1 1.0 1.1 1.0     Other results:    Imaging   CT HEAD WO CONTRAST (5MM)  Result Date: 06/19/2021 CLINICAL DATA:  ICH.  Nonfunctioning  ventriculostomy. EXAM: CT HEAD WITHOUT CONTRAST TECHNIQUE: Contiguous axial images were obtained from the base of the skull through the vertex without intravenous contrast. RADIATION DOSE REDUCTION: This exam was performed according to the departmental dose-optimization program which includes automated exposure control, adjustment of the mA and/or kV according to patient size and/or use of iterative reconstruction technique. COMPARISON:  06/12/2021 FINDINGS: Brain: Repositioned ventriculostomy since prior, tip in the region of the upper third ventricle. Hemorrhage encompasses most of the catheter at the level of the parenchyma. Pre-existing hemorrhage in the  right cerebrum along the prior catheter tract. Regression and more hazy appearance of the initial hematoma at the right caudothalamic groove. Lateral ventriculomegaly which is diminished from prior with only mild temporal horn distension currently. Remote high left frontal cortex infarct. Stable generalized low-density in the cerebral white matter. Vascular: Negative Skull: Unremarkable Sinuses/Orbits: Unremarkable IMPRESSION: 1. Repositioned ventriculostomy since prior, tip in the region of the upper third ventricle. Most of the catheter is encompassed by hemorrhage. 2. Mild improvement in lateral ventriculomegaly. 3. Decreasing hematoma at presentation. Electronically Signed   By: Jorje Guild M.D.   On: 06/19/2021 06:36     Medications:     Scheduled Medications:  amiodarone  200 mg Oral Daily   atorvastatin  40 mg Oral Daily   Chlorhexidine Gluconate Cloth  6 each Topical Q0600   docusate sodium  200 mg Oral Daily   gabapentin  300 mg Oral BID   losartan  25 mg Oral Daily   magnesium oxide  400 mg Oral BID   mouth rinse  15 mL Mouth Rinse BID   pantoprazole  40 mg Oral Daily   polyethylene glycol  17 g Oral Daily   senna  2 tablet Oral QHS   traZODone  50 mg Oral QHS    Infusions:  sodium chloride Stopped (06/19/21 1210)    ceFAZolin (ANCEF) IV 1 g (06/19/21 1455)    PRN Medications: sodium chloride, acetaminophen, butalbital-acetaminophen-caffeine, hydrALAZINE, ipratropium-albuterol, ondansetron (ZOFRAN) IV, oxyCODONE, polyethylene glycol, traMADol    Assessment/Plan:    1. Intracerebral hemorrhage - CT brain with intracerebral and intraventricular hemorrhage with obstructive hydrocephalus. INR 2.4 - AC reversed with Kcentra/vit k in ER - Underwent bedside ventriculostomy on 2/11 - Initial ventriculostomy drain occluded. CT head 2/17 with increased hemorrhage in right lateral ventricle, slightly increased ventricular dilation.  On 2/18, ventriculostomy drain replaced.  -  CT 2/25 improved ventriculomegaly. Drain pulled - Follow neuro exam closely. Repeat CT as needed   2. Chronic systolic CHF with HM III LVAD implant 04/20/17:  - Has done well with VAD support. - He has been seen at [facility] for transplant referral but has decided not to pursue transplant. No change - New oxygen requirement with pulmonary edema on 2/17 CXR => good diuresis on 2/18.  - Volume status stable.  - Off AC due to bleed  3. Paroxysmal AFL with RVR - had DC-CV on 10/17/18 with subsequent short episodes of AF after but has been mostly in NSR on low-dose amio (100 daily) - Had recurrent AF in 10/22. Remains in NSR - Continue amio 200 mg - No AC   4. h/o UGI AVM bleed - s/p APC x 4 lesions in 2/19 - No recurrent bleeding  - Continue PPI - Off ASA due to GIB.  - Hgb stable at 12.6   5. AKI on CKD Stage IIIa:  - Creatinine baseline 1.3 - 1.7  - Creatinine stabilized at 1.35  6. VAD management -  VAD interrogated personally. Parameters stable. - LDH has been stable  - INR goal 2.0-2.5 Off ASA due to GIB 2/19.  - INR 2.4 reversed due to [place]. INR 1.1  - MAPs stable 80-90s  - DL site looks good   6. HTN - goal MAP 70-90 with ICH  - losartan increased 2/14 with resultant hypotension and AKI - Hydralazine stopped.  - Losartan decreased to 25 mg daily on 02/18 d/t bump in Scr.  - Maps stable 70-80s  7. CAD:   - s/p PCI RCA/PLOM with DES x2 and DES to mild LAD 1/18 - No angina - Continue statin. Off ASA due to GIB   8. Hypomagnesemia. -  supplement as needed.   9. Hyponatremia - Received tolvaptan 2/18, Na 130 today -Follow daily.   CRITICAL CARE Performed by: Glori Bickers  Total critical care time: 35 minutes  Critical care time was exclusive of separately billable procedures and treating other patients.  Critical care was necessary to treat or prevent imminent or life-threatening deterioration.  Critical care was time spent personally by me (independent of  midlevel providers or residents) on the following activities: development of treatment plan with patient and/or surrogate as well as nursing, discussions with consultants, evaluation of patient's response to treatment, examination of patient, obtaining history from patient or surrogate, ordering and performing treatments and interventions, ordering and review of laboratory studies, ordering and review of radiographic studies, pulse oximetry and re-evaluation of patient's condition.   Length of Stay: [place], MD 06/19/2021, 3:01 PM  VAD Team --- VAD ISSUES ONLY--- Pager [phone] (7am - 7am)  Advanced Heart Failure Team  Pager [phone] (M-F; 7a - 5p)  Please contact [facility] for night-coverage after hours (5p -7a ) and weekends on [email or web address]

## 2021-06-20 DIAGNOSIS — I629 Nontraumatic intracranial hemorrhage, unspecified: Secondary | ICD-10-CM | POA: Diagnosis not present

## 2021-06-20 LAB — CBC
HCT: 41.3 % (ref 39.0–52.0)
Hemoglobin: 13.1 g/dL (ref 13.0–17.0)
MCH: 26.3 pg (ref 26.0–34.0)
MCHC: 31.7 g/dL (ref 30.0–36.0)
MCV: 82.9 fL (ref 80.0–100.0)
Platelets: 217 10*3/uL (ref 150–400)
RBC: 4.98 MIL/uL (ref 4.22–5.81)
RDW: 13.7 % (ref 11.5–15.5)
WBC: 14.8 10*3/uL — ABNORMAL HIGH (ref 4.0–10.5)
nRBC: 0 % (ref 0.0–0.2)

## 2021-06-20 LAB — PROTIME-INR
INR: 1.1 (ref 0.8–1.2)
Prothrombin Time: 13.8 seconds (ref 11.4–15.2)

## 2021-06-20 LAB — BASIC METABOLIC PANEL
Anion gap: 7 (ref 5–15)
BUN: 12 mg/dL (ref 6–20)
CO2: 25 mmol/L (ref 22–32)
Calcium: 8.8 mg/dL — ABNORMAL LOW (ref 8.9–10.3)
Chloride: 100 mmol/L (ref 98–111)
Creatinine, Ser: 1.24 mg/dL (ref 0.61–1.24)
GFR, Estimated: >60 mL/min (ref >60)
Glucose, Bld: 106 mg/dL — ABNORMAL HIGH (ref 70–99)
Potassium: 4.6 mmol/L (ref 3.5–5.1)
Sodium: 132 mmol/L — ABNORMAL LOW (ref 135–145)

## 2021-06-20 LAB — LACTATE DEHYDROGENASE: LDH: 216 U/L — ABNORMAL HIGH (ref 98–192)

## 2021-06-20 MED ORDER — TAMSULOSIN HCL 0.4 MG PO CAPS
0.4000 mg | ORAL_CAPSULE | Freq: Every day | ORAL | Status: DC
Start: 2021-06-20 — End: 2021-06-28
  Administered 2021-06-20 – 2021-06-28 (×9): 0.4 mg via ORAL
  Filled 2021-06-20 (×9): qty 1

## 2021-06-20 MED ORDER — HYDRALAZINE HCL 20 MG/ML IJ SOLN
10.0000 mg | INTRAMUSCULAR | Status: DC | PRN
  Administered 2021-06-20 – 2021-06-28 (×6): 10 mg via INTRAVENOUS
  Filled 2021-06-20 (×6): qty 1

## 2021-06-20 NOTE — Progress Notes (Signed)
°  NEUROSURGERY PROGRESS NOTE   No issues overnight, reports improving HA.  EXAM:  BP (!) 89/75    Pulse 63    Temp 98.2 F (36.8 C) (Oral)    Resp 12    Ht 5\' 5"  (1.651 m)    Wt 79.7 kg    SpO2 93%    BMI 29.24 kg/m   Awake, alert, oriented x3 Speech fluent, appropriate  CN grossly intact  5/5 BUE/BLE  Drain site c/d/I, no leak  IMPRESSION:  56 y.o. male with IVH, EVD d/c'ed yesterday. Remains stable  PLAN: - Cont supportive care per cardiology - Can f/u in outpatient NS clinic in 3-4 weeks   Consuella Lose, MD [facility]

## 2021-06-20 NOTE — Progress Notes (Signed)
Patient ID: David [name], male   DOB: [birth year], 56 y.o.   MRN: [id number] P  Advanced Heart Failure VAD Team Note  PCP-Cardiologist: Glori Bickers, MD   Subjective:    2/11 Presented with [place] 2/11 Underwent bedside ventriculostomy wit NSU 2/12 Repeat head CT with resolution of hydrocephalus. Persistent hematoma 2/17 Repeat CT head with increased hemorrhage in right lateral ventricle, slightly increased ventricular dilation.  The ventriculostomy drain is occluded.  2/18 Ventriculostomy drain replaced 2/25 CT improved ventriculomegaly. Drain pulled  Drain pulled yesterday. Still with HA. Stable 5/10  Remains alert.   MAPs 80-90   LVAD INTERROGATION:  HeartMate 3 LVAD:   Flow 4.7 liters/min, speed 5400, power 4.1, PI 4.5 .VAD interrogated personally. Parameters stable. 4-5 PI events.     Objective:    Vital Signs:   Temp:  [98 F (36.7 C)-98.5 F (36.9 C)] 98.2 F (36.8 C) (02/26 0815) Pulse Rate:  [30-97] 63 (02/26 0900) Resp:  [9-21] 12 (02/26 0900) BP: (67-125)/(51-98) 89/75 (02/26 0900) SpO2:  [87 %-97 %] 93 % (02/26 0900) Weight:  [79.7 kg] 79.7 kg (02/26 0500) Last BM Date : 06/18/21 Mean arterial Pressure  80-90s Intake/Output:   Intake/Output Summary (Last 24 hours) at 06/20/2021 0957 Last data filed at 06/20/2021 0700 Gross per 24 hour  Intake 1018.15 ml  Output 1245 ml  Net -226.85 ml      Physical Exam   General:  Sitting up in chair HEENT: normal + sutures Neck: supple. JVP not elevated.  Carotids 2+ bilat; no bruits. No lymphadenopathy or thryomegaly appreciated. Cor: LVAD hum.  Lungs: Clear. Abdomen: obese soft, nontender, non-distended. No hepatosplenomegaly. No bruits or masses. Good bowel sounds. Driveline site clean. Anchor in place.  Extremities: no cyanosis, clubbing, rash. Warm no edema  Neuro: alert & oriented x 3. No focal deficits. Moves all 4 without problem     Telemetry   SR 60s Personally reviewed  Labs   Basic  Metabolic Panel: Recent Labs  Lab 06/14/21 0135 06/15/21 0111 06/16/21 0258 06/17/21 0348 06/18/21 0126 06/19/21 0138 06/20/21 0336  NA 137   < > 134* 129* 131* 130* 132*  K 4.3   < > 4.4 3.9 4.0 4.0 4.6  CL 99   < > 95* 94* 97* 96* 100  CO2 27   < > 29 26 28 25 25   GLUCOSE 103*   < > 93 113* 86 113* 106*  BUN 25*   < > 25* 20 18 11 12   CREATININE 1.28*   < > 1.58* 1.62* 1.40* 1.35* 1.24  CALCIUM 9.2   < > 8.7* 8.5* 8.7* 8.7* 8.8*  MG 2.0  --   --   --   --  1.7  --    < > = values in this interval not displayed.     Liver Function Tests: No results for input(s): AST, ALT, ALKPHOS, BILITOT, PROT, ALBUMIN in the last 168 hours.   No results for input(s): LIPASE, AMYLASE in the last 168 hours. No results for input(s): AMMONIA in the last 168 hours.   CBC: Recent Labs  Lab 06/16/21 0258 06/17/21 0348 06/18/21 0126 06/19/21 0138 06/20/21 0336  WBC 10.0 9.6 10.3 12.2* 14.8*  HGB 12.6* 12.1* 12.4* 12.6* 13.1  HCT 40.1 38.2* 38.2* 39.9 41.3  MCV 83.5 83.2 83.4 83.1 82.9  PLT 245 228 217 224 217     INR: Recent Labs  Lab 06/16/21 0258 06/17/21 0348 06/18/21 0126 06/19/21 0138 06/20/21 4765  INR 1.1 1.0 1.1 1.0 1.1     Other results:    Imaging   CT HEAD WO CONTRAST (5MM)  Result Date: 06/19/2021 CLINICAL DATA:  ICH.  Nonfunctioning ventriculostomy. EXAM: CT HEAD WITHOUT CONTRAST TECHNIQUE: Contiguous axial images were obtained from the base of the skull through the vertex without intravenous contrast. RADIATION DOSE REDUCTION: This exam was performed according to the departmental dose-optimization program which includes automated exposure control, adjustment of the mA and/or kV according to patient size and/or use of iterative reconstruction technique. COMPARISON:  06/12/2021 FINDINGS: Brain: Repositioned ventriculostomy since prior, tip in the region of the upper third ventricle. Hemorrhage encompasses most of the catheter at the level of the parenchyma.  Pre-existing hemorrhage in the right cerebrum along the prior catheter tract. Regression and more hazy appearance of the initial hematoma at the right caudothalamic groove. Lateral ventriculomegaly which is diminished from prior with only mild temporal horn distension currently. Remote high left frontal cortex infarct. Stable generalized low-density in the cerebral white matter. Vascular: Negative Skull: Unremarkable Sinuses/Orbits: Unremarkable IMPRESSION: 1. Repositioned ventriculostomy since prior, tip in the region of the upper third ventricle. Most of the catheter is encompassed by hemorrhage. 2. Mild improvement in lateral ventriculomegaly. 3. Decreasing hematoma at presentation. Electronically Signed   By: Jorje Guild M.D.   On: 06/19/2021 06:36     Medications:     Scheduled Medications:  amiodarone  200 mg Oral Daily   atorvastatin  40 mg Oral Daily   Chlorhexidine Gluconate Cloth  6 each Topical Q0600   docusate sodium  200 mg Oral Daily   gabapentin  300 mg Oral BID   losartan  25 mg Oral Daily   magnesium oxide  400 mg Oral BID   mouth rinse  15 mL Mouth Rinse BID   pantoprazole  40 mg Oral Daily   polyethylene glycol  17 g Oral Daily   senna  2 tablet Oral QHS   traZODone  50 mg Oral QHS    Infusions:  sodium chloride Stopped (06/19/21 1210)    ceFAZolin (ANCEF) IV Stopped (06/20/21 0651)    PRN Medications: sodium chloride, acetaminophen, butalbital-acetaminophen-caffeine, hydrALAZINE, ipratropium-albuterol, ondansetron (ZOFRAN) IV, oxyCODONE, polyethylene glycol, traMADol    Assessment/Plan:    1. Intracerebral hemorrhage - CT brain with intracerebral and intraventricular hemorrhage with obstructive hydrocephalus. INR 2.4 - AC reversed with Kcentra/vit k in ER - Underwent bedside ventriculostomy on 2/11 - Initial ventriculostomy drain occluded. CT head 2/17 with increased hemorrhage in right lateral ventricle, slightly increased ventricular dilation.  On 2/18,  ventriculostomy drain replaced.  - CT 2/25 improved ventriculomegaly. Drain pulled 2/25 - Follow neuro exam closely. Stable today Repeat CT as needed   2. Chronic systolic CHF with HM III LVAD implant 04/20/17:  - Has done well with VAD support. - He has been seen at [facility] for transplant referral but has decided not to pursue transplant. No change - New oxygen requirement with pulmonary edema on 2/17 CXR => good diuresis on 2/18.  - Volume status stable.  - Off AC due to bleed  3. Paroxysmal AFL with RVR - had DC-CV on 10/17/18 with subsequent short episodes of AF after but has been mostly in NSR on low-dose amio (100 daily) - Had recurrent AF in 10/22. Remains in NSR - Continue amio 200 mg daily - No AC   4. h/o UGI AVM bleed - s/p APC x 4 lesions in 2/19 - No recurrent bleeding  - Continue PPI - Off  ASA due to GIB.  - Hgb stable at 12.6   5. AKI on CKD Stage IIIa:  - Creatinine baseline 1.3 - 1.7  - Creatinine stabilized 1.24 today  6. VAD management - VAD interrogated personally. Parameters stable. - LDH has been stable 216 today - INR goal 2.0-2.5 Off ASA due to GIB 2/19.  - INR 2.4 reversed due to [place]. INR 1.1  - MAPs stable 80-90s  - DL site looks good   6. HTN - goal MAP 70-90 with ICH  - losartan increased 2/14 with resultant hypotension and AKI - Hydralazine stopped.  - Losartan decreased to 25 mg daily on 02/18 d/t bump in Scr.  - Maps stable 80-90 today  7. CAD:   - s/p PCI RCA/PLOM with DES x2 and DES to mild LAD 1/18 - No angina - Continue statin. Off ASA due to GIB   8. Hypomagnesemia. -  supplement as needed.   9. Hyponatremia - Received tolvaptan 2/18, Na 132 today -Follow daily.     Length of Stay: [place], MD 06/20/2021, 9:57 AM  VAD Team --- VAD ISSUES ONLY--- Pager [phone] (7am - 7am)  Advanced Heart Failure Team  Pager [phone] (M-F; 7a - 5p)  Please contact [facility] for night-coverage after hours (5p -7a ) and  weekends on [email or web address]

## 2021-06-21 ENCOUNTER — Inpatient Hospital Stay (HOSPITAL_COMMUNITY): Payer: Medicare HMO

## 2021-06-21 DIAGNOSIS — I629 Nontraumatic intracranial hemorrhage, unspecified: Secondary | ICD-10-CM | POA: Diagnosis not present

## 2021-06-21 LAB — LACTATE DEHYDROGENASE: LDH: 174 U/L (ref 98–192)

## 2021-06-21 LAB — CBC
HCT: 41.2 % (ref 39.0–52.0)
Hemoglobin: 13.2 g/dL (ref 13.0–17.0)
MCH: 26.8 pg (ref 26.0–34.0)
MCHC: 32 g/dL (ref 30.0–36.0)
MCV: 83.6 fL (ref 80.0–100.0)
Platelets: 200 10*3/uL (ref 150–400)
RBC: 4.93 MIL/uL (ref 4.22–5.81)
RDW: 14 % (ref 11.5–15.5)
WBC: 11.5 10*3/uL — ABNORMAL HIGH (ref 4.0–10.5)
nRBC: 0 % (ref 0.0–0.2)

## 2021-06-21 LAB — BASIC METABOLIC PANEL
Anion gap: 6 (ref 5–15)
BUN: 11 mg/dL (ref 6–20)
CO2: 27 mmol/L (ref 22–32)
Calcium: 8.8 mg/dL — ABNORMAL LOW (ref 8.9–10.3)
Chloride: 102 mmol/L (ref 98–111)
Creatinine, Ser: 1.4 mg/dL — ABNORMAL HIGH (ref 0.61–1.24)
GFR, Estimated: 59 mL/min — ABNORMAL LOW (ref >60)
Glucose, Bld: 93 mg/dL (ref 70–99)
Potassium: 4.5 mmol/L (ref 3.5–5.1)
Sodium: 135 mmol/L (ref 135–145)

## 2021-06-21 LAB — PROTIME-INR
INR: 1 (ref 0.8–1.2)
Prothrombin Time: 13.3 seconds (ref 11.4–15.2)

## 2021-06-21 MED ORDER — WARFARIN SODIUM 1 MG PO TABS: 1.0000 mg | ORAL_TABLET | Freq: Once | ORAL | Status: DC

## 2021-06-21 MED ORDER — WARFARIN - PHARMACIST DOSING INPATIENT: Freq: Every day | Status: DC

## 2021-06-21 NOTE — Progress Notes (Signed)
Subjective: The patient is alert and pleasant.  He has no complaints except that he wants to go home.  Objective: Vital signs in last 24 hours: Temp:  [97.9 F (36.6 C)-98.7 F (37.1 C)] 97.9 F (36.6 C) (02/27 0427) Pulse Rate:  [30-92] 72 (02/27 0700) Resp:  [10-18] 13 (02/27 0700) BP: (69-123)/(51-112) 85/61 (02/27 0700) SpO2:  [90 %-97 %] 97 % (02/27 0700) Weight:  [81.1 kg] 81.1 kg (02/27 0500) Estimated body mass index is 29.75 kg/m as calculated from the following:   Height as of this encounter: 5\' 5"  (1.651 m).   Weight as of this encounter: 81.1 kg.   Intake/Output from previous day: 02/26 0701 - 02/27 0700 In: 99.9 [IV Piggyback:99.9] Out: 1275 [Urine:1275] Intake/Output this shift: No intake/output data recorded.  Physical exam the patient is alert and oriented.  His speech and strength are normal.  Lab Results: Recent Labs    06/20/21 0336 06/21/21 0440  WBC 14.8* 11.5*  HGB 13.1 13.2  HCT 41.3 41.2  PLT 217 200   BMET Recent Labs    06/20/21 0336 06/21/21 0440  NA 132* 135  K 4.6 4.5  CL 100 102  CO2 25 27  GLUCOSE 106* 93  BUN 12 11  CREATININE 1.24 1.40*  CALCIUM 8.8* 8.8*    Studies/Results: No results found.  Assessment/Plan: Status post anterior cerebral hemorrhage, interventricular hemorrhage, ventriculostomy: The patient is progressing well without his ventriculostomy.  It does not look like he will need a shunt.  I have answered all his questions.  LOS: 16 days     [name] 06/21/2021, 7:48 AM     Patient ID: David [name], male   DOB: 10-Sep-1965, 56 y.o.   MRN: [id number]

## 2021-06-21 NOTE — Progress Notes (Signed)
Physical Therapy Treatment Patient Details Name: David [name] MRN: [id number] DOB: 1965/10/25 Today's Date: 06/21/2021   History of Present Illness 56 y.o. male who presented 06/05/21 with worsening HA. CT showed right basal ganglia hemorrhage, intraventricular hemorrhage and obstructive hydrocephalus. S/p placement of right frontal ventriculostomy via bur hole 2/11. 2/12 Repeat head CT with resolution of hydrocephalus. 2/25 drain pulled. PMH: ICD, HfrEF, HLD, HTN, s/p LVAD HM-III 04/20/18, CAD, CKD, MI, seizures    PT Comments    Pt progressing well towards his physical therapy goals; demonstrates improved neutral posture (no left lateral lean detected) and activity tolerance. Pt ambulating 250 feet with a Rollator at a min guard assist level. Will need supervision in light of cognitive deficits at discharge; pt family very supportive.     Recommendations for follow up therapy are one component of a multi-disciplinary discharge planning process, led by the attending physician.  Recommendations may be updated based on patient status, additional functional criteria and insurance authorization.  Follow Up Recommendations  Home health PT     Assistance Recommended at Discharge Frequent or constant Supervision/Assistance  Patient can return home with the following A little help with bathing/dressing/bathroom;Assistance with cooking/housework;Direct supervision/assist for financial management;Direct supervision/assist for medications management;Assist for transportation;A little help with walking and/or transfers;Help with stairs or ramp for entrance   Equipment Recommendations  Rollator (4 wheels);BSC/3in1    Recommendations for Other Services       Precautions / Restrictions Precautions Precautions: Fall;Other (comment) Precaution Comments: LVAD (x4 year hx) Restrictions Weight Bearing Restrictions: No     Mobility  Bed Mobility Overal bed mobility: Needs Assistance Bed  Mobility: Supine to Sit Rolling: Supervision              Transfers Overall transfer level: Needs assistance Equipment used: Rollator (4 wheels) Transfers: Sit to/from Stand Sit to Stand: Min guard                Ambulation/Gait Ambulation/Gait assistance: Supervision Gait Distance (Feet): 250 Feet Assistive device: Rollator (4 wheels) Gait Pattern/deviations: Step-through pattern, Decreased stride length       General Gait Details: Pt with no left lateral lean this session, cues for looking up. Slower speed and decreased stride length with fatigue. Able to perform head turns during gait   Stairs             Wheelchair Mobility    Modified Rankin (Stroke Patients Only)       Balance Overall balance assessment: Needs assistance Sitting-balance support: No upper extremity supported, Feet supported Sitting balance-Leahy Scale: Fair     Standing balance support: During functional activity, Bilateral upper extremity supported, Reliant on assistive device for balance Standing balance-Leahy Scale: Fair                              Cognition Arousal/Alertness: Awake/alert Behavior During Therapy: Flat affect Overall Cognitive Status: Impaired/Different from baseline Area of Impairment: Attention, Memory, Following commands, Safety/judgement, Awareness, Problem solving, Orientation                 Orientation Level: Disoriented to, Place, Time Current Attention Level: Sustained Memory: Decreased recall of precautions, Decreased short-term memory Following Commands: Follows one step commands with increased time, Follows one step commands consistently Safety/Judgement: Decreased awareness of safety, Decreased awareness of deficits Awareness: Intellectual Problem Solving: Slow processing, Decreased initiation, Difficulty sequencing, Requires verbal cues, Requires tactile cues General Comments: Pt able to state he was  in [facility], but then stating, "either in [place] or [place]." correctly states [place]. decreased short term memory, cannot recall what he ate for breakfast. Confabulatory        Exercises General Exercises - Lower Extremity Heel Slides: Both, 10 reps, Supine Straight Leg Raises: Both, 10 reps, Supine    General Comments  MAP 80      Pertinent Vitals/Pain Pain Assessment Pain Assessment: No/denies pain    Home Living                          Prior Function            PT Goals (current goals can now be found in the care plan section) Acute Rehab PT Goals Potential to Achieve Goals: Good Progress towards PT goals: Progressing toward goals    Frequency    Min 3X/week      PT Plan Equipment recommendations need to be updated    Co-evaluation              AM-PAC PT "6 Clicks" Mobility   Outcome Measure  Help needed turning from your back to your side while in a flat bed without using bedrails?: None Help needed moving from lying on your back to sitting on the side of a flat bed without using bedrails?: A Little Help needed moving to and from a bed to a chair (including a wheelchair)?: A Little Help needed standing up from a chair using your arms (e.g., wheelchair or bedside chair)?: A Little Help needed to walk in hospital room?: A Little Help needed climbing 3-5 steps with a railing? : A Little 6 Click Score: 19    End of Session Equipment Utilized During Treatment: Gait belt Activity Tolerance: Patient tolerated treatment well Patient left: in chair;with call bell/phone within reach;with chair alarm set;with family/visitor present Nurse Communication: Mobility status PT Visit Diagnosis: Unsteadiness on feet (R26.81);Other abnormalities of gait and mobility (R26.89);Difficulty in walking, not elsewhere classified (R26.2)     Time: 8657-8469 PT Time Calculation (min) (ACUTE ONLY): 41 min  Charges:  $Gait Training: 8-22 mins $Therapeutic  Activity: 23-37 mins                     Wyona Almas, PT, DPT Acute Rehabilitation Services Pager [phone] Office [phone]    Deno Etienne 06/21/2021, 4:45 PM

## 2021-06-21 NOTE — Progress Notes (Signed)
LVAD Coordinator Rounding Note:  Admitted 06/05/21 due to [place] with hydrocephalus to Dr Bensimhon's service. Ventriculostomy placed at bedside by Dr Arnoldo Morale.   HM III LVAD implanted on 04/20/17 by Dr Cyndia Bent under destination therapy criteria.  EVD (external ventricular drain) was replaced x 2 on 06/12/21 by Dr. Zada Finders (Neuro). 2/25 CT improved ventriculomegaly. Drain pulled.  Pt in bed this morning awake, answering questions but slower to respond than previous conversations.   Feels ok. HA improving some but still present. Seems a bit confused at times but otherwise non-focal.   Vital signs: Temp: 98.2 HR: 68 Doppler Pressure: 80 Automatic BP: 89/78 (83) O2 Sat: 96% on RA Wt:182.3>186>186.5>189.6>190.2>197.5>187.6>172>189.6>187.6>185.6>191 lbs    LVAD interrogation reveals:  Speed: 5400 Flow: 4.4 Power:  4.0w PI: 4.5 Hematocrit: 41  Alarms: none Events: 8 today; 3 yesterday  Fixed speed: 5400 Low speed limit: 5100  Drive Line: Dressing C/D/I with anchor intact and accurately applied.  Weekly dressing changes per bedside RN or pt's wife. Next dressing change due: 06/21/21.  Labs:  LDH trend: 166>176>157>162>165>190>205>176>178>174>173>171>174  INR trend: 1.0>1.2>1.1>1.2>1.1>1.2>1.1>1.0>1.1>1.0   Anticoagulation Plan: -INR Goal: 2 - 2.5  (anticoagulation on hold currently due to [place]) -ASA Dose: none due to hx of GI bleed  Device: - [employer]tr[employer] BiV -Therapies: on- VF 200, VT 167  AT 171 monitored - Last check: 04/29/21   Adverse Events on VAD: - Readmitted 2/19 hgb 3.8. INR 2.9.transfused 4u pRBCs. 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 Call VAD coordinator for any VAD equipment or drive line issues 2. Weekly drive line dressing change per bedside RN or pt's wife. Next dressing change due 06/21/21.  Tanda Rockers RN, [place] Coordinator  Office:  [phone]  24/7 Pager: [phone]

## 2021-06-21 NOTE — Progress Notes (Signed)
Inpatient Rehabilitation Admissions Coordinator   Rehab consult received. Home health has been recommended by Therapy, not Cir. I await updated treatments today to assist with planning rehab venue options.  Danne Baxter, RN, MSN Rehab Admissions Coordinator [phone] 06/21/2021 1:56 PM

## 2021-06-21 NOTE — Progress Notes (Addendum)
Patient ID: David [name], male   DOB: [birth year], 56 y.o.   MRN: [id number]  Advanced Heart Failure VAD Team Note  PCP-Cardiologist: Glori Bickers, MD   Subjective:    2/11 Presented with [place] 2/11 Underwent bedside ventriculostomy wit NSU 2/12 Repeat head CT with resolution of hydrocephalus. Persistent hematoma 2/17 Repeat CT head with increased hemorrhage in right lateral ventricle, slightly increased ventricular dilation.  The ventriculostomy drain is occluded.  2/18 Ventriculostomy drain replaced 2/25 CT improved ventriculomegaly. Drain pulled  Says he just had lunch ( just had breakfast).  Says his wife is over there. Headache 5/10.    LVAD INTERROGATION:  HeartMate 3 LVAD:   Flow 5 liters/min, speed 5400, power 4, PI 3 .VAD interrogated personally. Parameters stable.< 10 PI events.    Objective:    Vital Signs:   Temp:  [97.9 F (36.6 C)-98.7 F (37.1 C)] 97.9 F (36.6 C) (02/27 0427) Pulse Rate:  [30-92] 72 (02/27 0700) Resp:  [10-18] 13 (02/27 0700) BP: (69-123)/(51-112) 85/61 (02/27 0700) SpO2:  [90 %-97 %] 97 % (02/27 0700) Weight:  [81.1 kg] 81.1 kg (02/27 0500) Last BM Date : 06/18/21 Mean arterial Pressure  80-90s Intake/Output:   Intake/Output Summary (Last 24 hours) at 06/21/2021 0749 Last data filed at 06/21/2021 0400 Gross per 24 hour  Intake 99.93 ml  Output 1275 ml  Net -1175.07 ml     Physical Exam   Physical Exam: GENERAL: No acute distress. HEENT: sutures scalp on right.  NECK: Supple, JVP flat .  2+ bilaterally, no bruits.  No lymphadenopathy or thyromegaly appreciated.   CARDIAC:  Mechanical heart sounds with LVAD hum present.  LUNGS:  Clear to auscultation bilaterally.  ABDOMEN:  Soft, round, nontender, positive bowel sounds x4.     LVAD exit site: well-healed and incorporated.  Dressing dry and intact.  No erythema or drainage.  Stabilization device present and accurately applied.  Driveline dressing is being changed daily per  sterile technique. EXTREMITIES:  Warm and dry, no cyanosis, clubbing, rash or edema  NEUROLOGIC:  Alert and oriented x 3.    No aphasia.  No dysarthria.  Affect pleasant.      Telemetry   SR 60-70s   Labs   Basic Metabolic Panel: Recent Labs  Lab 06/17/21 0348 06/18/21 0126 06/19/21 0138 06/20/21 0336 06/21/21 0440  NA 129* 131* 130* 132* 135  K 3.9 4.0 4.0 4.6 4.5  CL 94* 97* 96* 100 102  CO2 26 28 25 25 27   GLUCOSE 113* 86 113* 106* 93  BUN 20 18 11 12 11   CREATININE 1.62* 1.40* 1.35* 1.24 1.40*  CALCIUM 8.5* 8.7* 8.7* 8.8* 8.8*  MG  --   --  1.7  --   --     Liver Function Tests: No results for input(s): AST, ALT, ALKPHOS, BILITOT, PROT, ALBUMIN in the last 168 hours.   No results for input(s): LIPASE, AMYLASE in the last 168 hours. No results for input(s): AMMONIA in the last 168 hours.   CBC: Recent Labs  Lab 06/17/21 0348 06/18/21 0126 06/19/21 0138 06/20/21 0336 06/21/21 0440  WBC 9.6 10.3 12.2* 14.8* 11.5*  HGB 12.1* 12.4* 12.6* 13.1 13.2  HCT 38.2* 38.2* 39.9 41.3 41.2  MCV 83.2 83.4 83.1 82.9 83.6  PLT 228 217 224 217 200    INR: Recent Labs  Lab 06/17/21 0348 06/18/21 0126 06/19/21 0138 06/20/21 0336 06/21/21 0440  INR 1.0 1.1 1.0 1.1 1.0    Other results:  Imaging   No results found.   Medications:     Scheduled Medications:  amiodarone  200 mg Oral Daily   atorvastatin  40 mg Oral Daily   Chlorhexidine Gluconate Cloth  6 each Topical Q0600   docusate sodium  200 mg Oral Daily   gabapentin  300 mg Oral BID   losartan  25 mg Oral Daily   magnesium oxide  400 mg Oral BID   mouth rinse  15 mL Mouth Rinse BID   pantoprazole  40 mg Oral Daily   polyethylene glycol  17 g Oral Daily   senna  2 tablet Oral QHS   tamsulosin  0.4 mg Oral Daily   traZODone  50 mg Oral QHS    Infusions:  sodium chloride Stopped (06/19/21 1210)    ceFAZolin (ANCEF) IV 1 g (06/21/21 0653)    PRN Medications: sodium chloride,  acetaminophen, butalbital-acetaminophen-caffeine, hydrALAZINE, ipratropium-albuterol, ondansetron (ZOFRAN) IV, oxyCODONE, polyethylene glycol, traMADol    Assessment/Plan:    1. Intracerebral hemorrhage - CT brain with intracerebral and intraventricular hemorrhage with obstructive hydrocephalus. INR 2.4 - AC reversed with Kcentra/vit k in ER - Underwent bedside ventriculostomy on 2/11 - Initial ventriculostomy drain occluded. CT head 2/17 with increased hemorrhage in right lateral ventricle, slightly increased ventricular dilation.  On 2/18, ventriculostomy drain replaced.  - CT 2/25 improved ventriculomegaly. Drain pulled 2/25 - Watch neuro status.  - Repeat CT as needed   2. Chronic systolic CHF with HM III LVAD implant 04/20/17:  - Has done well with VAD support. - He has been seen at [facility] for transplant referral but has decided not to pursue transplant. No change - New oxygen requirement with pulmonary edema on 2/17 CXR => good diuresis on 2/18.  - Volume status stable.  - Consider restarting heparin drip at low dose.    3. Paroxysmal AFL with RVR - had DC-CV on 10/17/18 with subsequent short episodes of AF after but has been mostly in NSR on low-dose amio (100 daily) - Had recurrent AF in 10/22. Remains in NSR - Continue amio 200 mg daily - No AC   4. h/o UGI AVM bleed - s/p APC x 4 lesions in 2/19 - No recurrent bleeding  - Continue PPI - Off ASA due to GIB.  - Hgb stable 13.2    5. AKI on CKD Stage IIIa:  - Creatinine baseline 1.3 - 1.7  - Creatinine stabilized 1.4 today  6. VAD management - VAD interrogated personally. Parameters stable. - LDH has been stable 216 today - INR goal 2.0-2.5 Off ASA due to GIB 2/19.  - INR 2.4 reversed due to [place]. INR 1 today  - MAPs 70  - DL site looks good   6. HTN - goal MAP 70-90 with ICH  - losartan increased 2/14 with resultant hypotension and AKI.Hydralazine stopped.  - Losartan decreased to 25 mg daily on 02/18 d/t bump in  Scr.  - May need to cut back losartan to 12.5 mg if Maps drop.  7. CAD:   - s/p PCI RCA/PLOM with DES x2 and DES to mild LAD 1/18 - No angina - Continue statin. Off ASA due to GIB   8. Hypomagnesemia. -  Check mag    9. Hyponatremia - Received tolvaptan 2/18, Na 132 today -Follow daily.   PT/OT following.   Consult CIR.   Length of Stay: [place], NP 06/21/2021, 7:49 AM  VAD Team --- VAD ISSUES ONLY--- Pager [phone] (7am - 7am)  Advanced Heart Failure Team  Pager [phone] (M-F; 7a - 5p)  Please contact [facility] for night-coverage after hours (5p -7a ) and weekends on [email or web address]  Patient seen and examined with the above-signed Advanced Practice Provider and/or Housestaff. I personally reviewed laboratory data, imaging studies and relevant notes. I independently examined the patient and formulated the important aspects of the plan. I have edited the note to reflect any of my changes or salient points. I have personally discussed the plan with the patient and/or family.  Feels ok. HA improving some but still present. Seems a bit confused at times but otherwise non-focal.   MAPs and volume status stable.   General:  NAD.  HEENT: normal  Neck: supple. JVP not elevated.  Carotids 2+ bilat; no bruits. No lymphadenopathy or thryomegaly appreciated. Cor: LVAD hum.  Lungs: Clear. Abdomen: soft, nontender, non-distended. No hepatosplenomegaly. No bruits or masses. Good bowel sounds. Driveline site clean. Anchor in place.  Extremities: no cyanosis, clubbing, rash. Warm no edema  Neuro: alert & oriented x 3. No focal deficits. Moves all 4 without problem   D/w NSU. Will plan to restart coumadin today and follow closely. Can go to [facility]. Agree with CIR consult.   Volume status and MAPs ok.   I am going to go ahead and get repeat head CT to see baseline prior to starting [facility].   Glori Bickers, MD  10:16 AM

## 2021-06-21 NOTE — Progress Notes (Signed)
ANTICOAGULATION CONSULT NOTE  Pharmacy Consult for warfarin Indication:  LVAD  Allergies  Allergen Reactions   Plavix [Clopidogrel Bisulfate] Hives    Patient Measurements: Height: 5\' 5"  (165.1 cm) Weight: 81.1 kg (178 lb 12.7 oz) IBW/kg (Calculated) : 61.5  Vital Signs: Temp: 98.3 F (36.8 C) (02/27 1120) Temp Source: Oral (02/27 1120) BP: 97/79 (02/27 1500) Pulse Rate: 72 (02/27 1500)  Labs: Recent Labs    06/19/21 0138 06/20/21 0336 06/21/21 0440  HGB 12.6* 13.1 13.2  HCT 39.9 41.3 41.2  PLT 224 217 200  LABPROT 13.6 13.8 13.3  INR 1.0 1.1 1.0  CREATININE 1.35* 1.24 1.40*    Estimated Creatinine Clearance: 57.1 mL/min (A) (by C-G formula based on SCr of 1.4 mg/dL (H)).   Medical History: Past Medical History:  Diagnosis Date   AICD (automatic cardioverter/defibrillator) present    Anemia    Anxiety    CAD (coronary artery disease) 2009   AMI in 12/2007 with PCI to LAD, staged PCI to  mid/distal RCA, NSTEMI in 02/2009 with BMS to LCx   CHF (congestive heart failure) ([place])    Chronic kidney disease 11/03/2016   stage 3 kidney disease   Dysrhythmia    "arrythmia problems at some point", "fatal rhythms"   History of blood transfusion    "I was bleeding from was rectum" (04/19/2017)   HLD (hyperlipidemia)    HTN (hypertension)    Hyperlipidemia 03/15/2011   Ischemic cardiomyopathy    Admitted in 07/2010 with CHF exacerbation   Myocardial infarction ([place])    "I've had 4" (04/19/2017)   Pneumonia 2018 X 1   Seizures ([place])    one seizure in 04/2016 during cardiac event (04/19/2017)   STEMI 2009 (anterior), 2010 (lateral), and 2012 (inferior) 03/14/2011    Assessment: 76 yoM on warfarin PTA for hx HM3 LVAD. Pt admitted 2/11 with ICH requiring ventriculostomy. INR 2.4 on admit reversed with vitamin K and 4FPCC. Ventriculostomy drain removed 2/25.  INR today 1. CBC and LDH stable. Pharmacy consulted to resume low dose warfarin.  *Home warfarin dose 4mg   Fri, 2mg  AODs  Goal of Therapy:  INR 2.0-2.5 Monitor platelets by anticoagulation protocol: Yes   Plan:  Warfarin 1mg  PO x1 tonight Daily INR  Arrie Senate, PharmD, BCPS, [facility] Clinical Pharmacist [phone] Please check AMION for all [facility] Pharmacy numbers 06/21/2021

## 2021-06-22 DIAGNOSIS — I629 Nontraumatic intracranial hemorrhage, unspecified: Secondary | ICD-10-CM | POA: Diagnosis not present

## 2021-06-22 LAB — CBC
HCT: 38.8 % — ABNORMAL LOW (ref 39.0–52.0)
Hemoglobin: 12 g/dL — ABNORMAL LOW (ref 13.0–17.0)
MCH: 26.3 pg (ref 26.0–34.0)
MCHC: 30.9 g/dL (ref 30.0–36.0)
MCV: 85.1 fL (ref 80.0–100.0)
Platelets: 202 10*3/uL (ref 150–400)
RBC: 4.56 MIL/uL (ref 4.22–5.81)
RDW: 14.2 % (ref 11.5–15.5)
WBC: 11.3 10*3/uL — ABNORMAL HIGH (ref 4.0–10.5)
nRBC: 0 % (ref 0.0–0.2)

## 2021-06-22 LAB — BASIC METABOLIC PANEL
Anion gap: 7 (ref 5–15)
BUN: 11 mg/dL (ref 6–20)
CO2: 26 mmol/L (ref 22–32)
Calcium: 8.5 mg/dL — ABNORMAL LOW (ref 8.9–10.3)
Chloride: 101 mmol/L (ref 98–111)
Creatinine, Ser: 1.36 mg/dL — ABNORMAL HIGH (ref 0.61–1.24)
GFR, Estimated: >60 mL/min (ref >60)
Glucose, Bld: 89 mg/dL (ref 70–99)
Potassium: 4.6 mmol/L (ref 3.5–5.1)
Sodium: 134 mmol/L — ABNORMAL LOW (ref 135–145)

## 2021-06-22 LAB — LACTATE DEHYDROGENASE: LDH: 191 U/L (ref 98–192)

## 2021-06-22 LAB — PROTIME-INR
INR: 1.1 (ref 0.8–1.2)
Prothrombin Time: 13.7 seconds (ref 11.4–15.2)

## 2021-06-22 LAB — MAGNESIUM: Magnesium: 1.7 mg/dL (ref 1.7–2.4)

## 2021-06-22 MED ORDER — MAGNESIUM SULFATE 2 GM/50ML IV SOLN
2.0000 g | Freq: Once | INTRAVENOUS | Status: AC
  Administered 2021-06-22: 2 g via INTRAVENOUS
  Filled 2021-06-22: qty 50

## 2021-06-22 MED ORDER — MUPIROCIN 2 % EX OINT: 1.0000 "application " | TOPICAL_OINTMENT | Freq: Two times a day (BID) | CUTANEOUS | Status: DC

## 2021-06-22 MED ORDER — WARFARIN SODIUM 1 MG PO TABS
1.0000 mg | ORAL_TABLET | Freq: Once | ORAL | Status: DC
  Filled 2021-06-22: qty 1

## 2021-06-22 NOTE — Progress Notes (Signed)
Subjective: The patient is alert and pleasant.  He wants to go home.  Objective: Vital signs in last 24 hours: Temp:  [97.7 F (36.5 C)-98.3 F (36.8 C)] 98.1 F (36.7 C) (02/28 0400) Pulse Rate:  [30-96] 30 (02/28 0600) Resp:  [10-16] 10 (02/28 0600) BP: (77-142)/(50-125) 102/86 (02/28 0600) SpO2:  [88 %-100 %] 98 % (02/28 0600) Weight:  [79.9 kg] 79.9 kg (02/28 0500) Estimated body mass index is 29.31 kg/m as calculated from the following:   Height as of this encounter: 5\' 5"  (1.651 m).   Weight as of this encounter: 79.9 kg.   Intake/Output from previous day: 02/27 0701 - 02/28 0700 In: 300 [P.O.:100; IV Piggyback:200] Out: 565 [Urine:565] Intake/Output this shift: No intake/output data recorded.  Physical exam the patient is alert and oriented.  His speech and strength is normal.  His wounds are healing well.  Lab Results: Recent Labs    06/21/21 0440 06/22/21 0013  WBC 11.5* 11.3*  HGB 13.2 12.0*  HCT 41.2 38.8*  PLT 200 202   BMET Recent Labs    06/21/21 0440 06/22/21 0013  NA 135 134*  K 4.5 4.6  CL 102 101  CO2 27 26  GLUCOSE 93 89  BUN 11 11  CREATININE 1.40* 1.36*  CALCIUM 8.8* 8.5*    Studies/Results: CT HEAD WO CONTRAST (5MM)  Result Date: 06/21/2021 CLINICAL DATA:  Subarachnoid hemorrhage. EXAM: CT HEAD WITHOUT CONTRAST TECHNIQUE: Contiguous axial images were obtained from the base of the skull through the vertex without intravenous contrast. RADIATION DOSE REDUCTION: This exam was performed according to the departmental dose-optimization program which includes automated exposure control, adjustment of the mA and/or kV according to patient size and/or use of iterative reconstruction technique. COMPARISON:  06/19/2021. FINDINGS: Brain: There is redemonstration of acute hemorrhage in the frontal lobe and basal ganglia on the right along the tract of a ventriculostomy catheter which is not significantly changed from the prior exam. The  ventriculoperitoneal shunt is no longer seen and is likely been removed. There is surrounding vasogenic edema with mass effect and midline shift to the left of approximally 8 mm at the level of the septum pellucidum. Minimal residual blood products are noted in the posterior horn of the right lateral ventricle. The ventricles are stable in size and morphology. Periventricular white matter hypodensities are noted bilaterally. There is mild encephalomalacia in the frontal lobe on the left possibly related to old infarct. An old lacunar infarct is noted in the basal ganglia on the right. Vascular: No hyperdense vessel or unexpected calcification. Skull: No acute fracture. A burr hole is present in the frontal bone on the right. Sinuses/Orbits: No acute finding. Other: Surgical clips and a small scalp hematoma are present over the frontal bone on the right. IMPRESSION: 1. Interval removal of right ventriculostomy catheter. 2. Stable intraparenchymal hemorrhage in the frontal lobe and basal ganglia on the right. Midline shift is slightly reduced from the prior exam. 3. Small scalp hematoma over the frontal bone on the right. 4. Remaining findings are unchanged. Electronically Signed   By: Brett Fairy M.D.   On: 06/21/2021 20:54    Assessment/Plan: Intracerebral hemorrhage, intraventricular hemorrhage, hydrocephalus, status post ventriculostomy: The patient is doing well.  We will remove his staples and sutures.  I will sign off.  Please have him follow-up with me in the office in a few weeks.  Please call if I can be of further assistance.  LOS: 17 days     David [name]  David [name] 06/22/2021, 7:47 AM     Patient ID: David [name], male   DOB: 07-16-1965, 56 y.o.   MRN: [id number]

## 2021-06-22 NOTE — Progress Notes (Addendum)
Inpatient Rehabilitation Admissions Coordinator   Patient has progressed well with therapy and Home health continues to be recommended. Supervision 250 feet yesterday. We will not pursue CIR admit. Acute team and TOC made aware.  Danne Baxter, RN, MSN Rehab Admissions Coordinator [phone] 06/22/2021 11:12 AM  I met with patient , wife and daughter at bedside per their request. I explained that home health has been recommended by therapy as he still needs supervision, but does not need intensive inpatient rehab level at supervision to min guard assist level. They refer that they were told that he was independent and able to care for himself therefore not a CIR candidate. I explained that this was a misrepresentation/miscommunication, for we feel he needs supervision due his cognitive issues, need of encouragement to be more active, etc. They were appreciative of my answering their questions. I recommend continued therapy, cardiac rehab, etc to assist in planning for return home.  Danne Baxter, RN, MSN Rehab Admissions Coordinator [phone] 06/22/2021 2:00 PM

## 2021-06-22 NOTE — Progress Notes (Addendum)
ANTICOAGULATION CONSULT NOTE  Pharmacy Consult for warfarin Indication:  LVAD  Allergies  Allergen Reactions   Plavix [Clopidogrel Bisulfate] Hives    Patient Measurements: Height: 5\' 5"  (165.1 cm) Weight: 79.9 kg (176 lb 2.4 oz) IBW/kg (Calculated) : 61.5  Vital Signs: Temp: 98.2 F (36.8 C) (02/28 0758) Temp Source: Oral (02/28 0758) BP: 89/73 (02/28 0805) Pulse Rate: 69 (02/28 0805)  Labs: Recent Labs    06/20/21 0336 06/21/21 0440 06/22/21 0013  HGB 13.1 13.2 12.0*  HCT 41.3 41.2 38.8*  PLT 217 200 202  LABPROT 13.8 13.3 13.7  INR 1.1 1.0 1.1  CREATININE 1.24 1.40* 1.36*     Estimated Creatinine Clearance: 58.4 mL/min (A) (by C-G formula based on SCr of 1.36 mg/dL (H)).   Medical History: Past Medical History:  Diagnosis Date   AICD (automatic cardioverter/defibrillator) present    Anemia    Anxiety    CAD (coronary artery disease) 2009   AMI in 12/2007 with PCI to LAD, staged PCI to  mid/distal RCA, NSTEMI in 02/2009 with BMS to LCx   CHF (congestive heart failure) ([place])    Chronic kidney disease 11/03/2016   stage 3 kidney disease   Dysrhythmia    "arrythmia problems at some point", "fatal rhythms"   History of blood transfusion    "I was bleeding from was rectum" (04/19/2017)   HLD (hyperlipidemia)    HTN (hypertension)    Hyperlipidemia 03/15/2011   Ischemic cardiomyopathy    Admitted in 07/2010 with CHF exacerbation   Myocardial infarction ([place])    "I've had 4" (04/19/2017)   Pneumonia 2018 X 1   Seizures ([place])    one seizure in 04/2016 during cardiac event (04/19/2017)   STEMI 2009 (anterior), 2010 (lateral), and 2012 (inferior) 03/14/2011    Assessment: 71 yoM on warfarin PTA for hx HM3 LVAD. Pt admitted 2/11 with ICH requiring ventriculostomy. INR 2.4 on admit reversed with vitamin K and 4FPCC. Ventriculostomy drain removed 2/25.  Warfarin ordered to resume 2/27 but not given, head CT stable. INR 1.1 as expected. CBC and LDH  stable.  *Home warfarin dose 4mg  Fri, 2mg  AODs  Goal of Therapy:  INR 2.0-2.5 Monitor platelets by anticoagulation protocol: Yes   Plan:  Warfarin 1mg  PO x1 tonight Daily INR   Arrie Senate, PharmD, BCPS, [facility] Clinical Pharmacist [phone] Please check AMION for all [facility] Pharmacy numbers 06/22/2021

## 2021-06-22 NOTE — TOC Initial Note (Signed)
Transition of Care [facility] - Initial/Assessment Note    Patient Details  Name: David [name] MRN: [id number] Date of Birth: 1965/11/11  Transition of Care [facility]) CM/SW Contact:    Erenest Rasher, RN Phone Number: [phone] 06/22/2021, 3:40 PM  Clinical Narrative:                 HF TOC CM spoke to pt and wife at bedside. Wife is requestig RW with seat and 3n1 bedside commode. Contacted [employer] rep, Freda Munro for delivery to room prior to dc. Wife requested [facility] at initial TOC CM assessment. Contacted [facility] rep, Stacie with new referral.   Expected Discharge Plan: [place] Barriers to Discharge: Continued Medical Work up   Patient Goals and CMS Choice Patient states their goals for this hospitalization and ongoing recovery are:: wife states she takes care of pt at home CMS Medicare.gov Compare Post Acute Care list provided to:: Patient Represenative (must comment) Choice offered to / list presented to : Spouse  Expected Discharge Plan and Services Expected Discharge Plan: [place]   Discharge Planning Services: CM Consult Post Acute Care Choice: [place] arrangements for the past 2 months: [place]                 DME Arranged: 3-N-1, Walker rolling with seat DME Agency: [employer] Date DME Agency Contacted: 06/22/21 Time DME Agency Contacted: 0932 Representative spoke with at DME Agency: Melina Modena HH Arranged: RN, PT, OT [facility] Agency: [place] Date [place]: 06/22/21 Time [place]: 1539 Representative spoke with at [place]: Freddi Starr  Prior Living Arrangements/Services Living arrangements for the past 2 months: Single Family Home Lives with:: Spouse Patient language and need for interpreter reviewed:: Yes Do you feel safe going back to the place where you live?: Yes      Need for Family Participation in Patient Care: Yes (Comment) Care giver support  system in place?: Yes (comment) Current home services: DME Criminal Activity/Legal Involvement Pertinent to Current Situation/Hospitalization: No - Comment as needed  Activities of Daily Living Home Assistive Devices/Equipment: None ADL Screening (condition at time of admission) Patient's cognitive ability adequate to safely complete daily activities?: Yes Is the patient deaf or have difficulty hearing?: No Does the patient have difficulty seeing, even when wearing glasses/contacts?: No Does the patient have difficulty concentrating, remembering, or making decisions?: No Patient able to express need for assistance with ADLs?: Yes Does the patient have difficulty dressing or bathing?: No Independently performs ADLs?: Yes (appropriate for developmental age) Does the patient have difficulty walking or climbing stairs?: No Weakness of Legs: None Weakness of Arms/Hands: None  Permission Sought/Granted Permission sought to share information with : Case Manager, Family Supports, PCP Permission granted to share information with : Yes, Verbal Permission Granted  Share Information with NAME: Reinhardt Licausi  Permission granted to share info w AGENCY: Home Health, Outpt Occupational Therapy, DME  Permission granted to share info w Relationship: wife  Permission granted to share info w Contact Information: [phone]  Emotional Assessment Appearance:: Appears stated age Attitude/Demeanor/Rapport: Engaged Affect (typically observed): Accepting Orientation: : Oriented to Self, Oriented to Place, Oriented to  Time, Oriented to Situation   Psych Involvement: No (comment)  Admission diagnosis:  Intracranial hemorrhage (HCC) [I62.9] Intraparenchymal hemorrhage of brain (HCC) [I61.9] ICH (intracerebral hemorrhage) (HCC) [I61.9] Patient Active Problem List   Diagnosis Date Noted   ICH (intracerebral hemorrhage) ([place])  06/05/2021   Left ventricular assist device present ([place]) 06/08/2017   Gastric  AVM    Presence of left ventricular assist device (LVAD) ([place]) 04/20/2017   Palliative care encounter    Ischemic cardiomyopathy 11/07/2016   CKD (chronic kidney disease), stage III ([place]) 05/13/2016   PAF (paroxysmal atrial fibrillation) ([place])    Chronic systolic heart failure ([place]) 03/15/2011   PCP:  Raina Mina., MD Pharmacy:   [facility] [address], [place] [address] [place] [address] Phone: [phone] Fax: [phone]  [facility] [phone] - [place], [address] AT [place] [address] [place] [address] Phone: [phone] Fax: [phone]  [facility] [place], [place] [address] Phone: [phone] Fax: [place] [address] [place] [address] Phone: [phone] Fax: [phone]     Social Determinants of Health (SDOH) Interventions    Readmission Risk Interventions No flowsheet data found.

## 2021-06-22 NOTE — Plan of Care (Signed)
  Problem: Education: Goal: Knowledge of General Education information will improve Description: Including pain rating scale, medication(s)/side effects and non-pharmacologic comfort measures Outcome: Progressing   Problem: Health Behavior/Discharge Planning: Goal: Ability to manage health-related needs will improve Outcome: Progressing   Problem: Clinical Measurements: Goal: Ability to maintain clinical measurements within normal limits will improve Outcome: Progressing Goal: Respiratory complications will improve Outcome: Progressing Goal: Cardiovascular complication will be avoided Outcome: Progressing   Problem: Activity: Goal: Risk for activity intolerance will decrease Outcome: Progressing   

## 2021-06-22 NOTE — Progress Notes (Addendum)
LVAD Coordinator Rounding Note:  Admitted 06/05/21 due to [place] with hydrocephalus to Dr Bensimhon's service. Ventriculostomy placed at bedside by Dr Arnoldo Morale.   HM III LVAD implanted on 04/20/17 by Dr Cyndia Bent under destination therapy criteria.  EVD (external ventricular drain) was replaced x 2 on 06/12/21 by Dr. Zada Finders (Neuro). 2/25 CT improved ventriculomegaly. Drain pulled. Repeat head CT yesterday:  1. Interval removal of right ventriculostomy catheter. 2. Stable intraparenchymal hemorrhage in the frontal lobe and basal ganglia on the right. Midline shift is slightly reduced from the prior exam. 3. Small scalp hematoma over the frontal bone on the right. 4. Remaining findings are unchanged.  Per Dr Arnoldo Morale (neurosurgery) he will sign off for now. Pt will need follow up with him in his office in a few weeks. Will scheduled appt at discharge.   Pt sitting up in recliner this morning. He recognizes me, and is able to tell me I am the LVAD coordinator, but does not know my name. He appears to otherwise be oriented. At times his responses are sluggish, but overall able to carry on a conversation.   Feels ok. Reports "5.5 / 10" headache at the back of his head, right side of head, and down into his neck. Received Oxycodone 10 mg at 0408, Tramadol 50 ng at 0502, and Fioricet 2 tablets at 0643.   Warfarin has been restarted. Will closely monitor for neuro changes with anticoagulation restart.   May transfer to [facility] today per Dr Haroldine Laws.   Vital signs: Temp: 98.2 HR: 68 Doppler Pressure: 88 Automatic BP: 94/78 (85) O2 Sat: 99% on RA Wt:182.3>186>186.5>189.6>190.2>197.5>187.6>172>189.6>187.6>185.6>191>176.1 lbs    LVAD interrogation reveals:  Speed: 5400 Flow: 4.4 Power:  4.1w PI: 4.8 Hematocrit: 41  Alarms: none Events: none  Fixed speed: 5400 Low speed limit: 5100  Drive Line: Dressing C/D/I with anchor intact and accurately applied.  Weekly dressing changes per bedside RN or  pt's wife. Next dressing change due: 06/28/21.  Labs:  LDH trend: 166>176>157>162>165>190>205>176>178>174>173>171>174>191  INR trend: 1.0>1.2>1.1>1.2>1.1>1.2>1.1>1.0>1.1>1.0>1.1   Anticoagulation Plan: -INR Goal: 2 - 2.5  (anticoagulation on hold currently due to [place]) -ASA Dose: none due to hx of GI bleed  Device: - Medtronic BiV -Therapies: on- VF 200, VT 167  AT 171 monitored - Last check: 04/29/21   Adverse Events on VAD: - Readmitted 2/19 hgb 3.8. INR 2.9.transfused 4u pRBCs. 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   Plan/Recommendations:  1. Call VAD coordinator for any VAD equipment or drive line issues 2. Weekly drive line dressing change per bedside RN or pt's wife. Next dressing change due 06/28/21. 3. May transfer to [facility] today per Dr Haroldine Laws  Emerson Monte RN [place] Coordinator  Office: [phone]  24/7 Pager: [phone]

## 2021-06-22 NOTE — Progress Notes (Addendum)
Patient ID: David [name], male   DOB: [birth year], 56 y.o.   MRN: [id number] P  Advanced Heart Failure VAD Team Note  PCP-Cardiologist: Glori Bickers, MD   Subjective:    2/11 Presented with [place] 2/11 Underwent bedside ventriculostomy wit NSU 2/12 Repeat head CT with resolution of hydrocephalus. Persistent hematoma 2/17 Repeat CT head with increased hemorrhage in right lateral ventricle, slightly increased ventricular dilation.  The ventriculostomy drain is occluded.  2/18 Ventriculostomy drain replaced 2/25 CT improved ventriculomegaly. Drain pulled   Complaining of headache 5.5 /10.   LVAD INTERROGATION:  HeartMate 3 LVAD:   Flow 4.6  liters/min, speed 5400, power 4, PI 3.7  .VAD interrogated personally. Parameters stable.   Objective:    Vital Signs:   Temp:  [97.7 F (36.5 C)-98.3 F (36.8 C)] 98.2 F (36.8 C) (02/28 0758) Pulse Rate:  [30-96] 69 (02/28 0805) Resp:  [10-16] 11 (02/28 0805) BP: (77-142)/(50-125) 89/73 (02/28 0805) SpO2:  [88 %-100 %] 93 % (02/28 0805) Weight:  [79.9 kg] 79.9 kg (02/28 0500) Last BM Date : 06/20/21 Mean arterial Pressure  80-90s Intake/Output:   Intake/Output Summary (Last 24 hours) at 06/22/2021 0827 Last data filed at 06/22/2021 0600 Gross per 24 hour  Intake 150 ml  Output 565 ml  Net -415 ml     Physical Exam   Physical Exam: GENERAL: No acute distress.Sitting in the chair.  HEENT: sutures scalp on right.  NECK: Supple, JVP flat .  2+ bilaterally, no bruits.  No lymphadenopathy or thyromegaly appreciated.   CARDIAC:  Mechanical heart sounds with LVAD hum present.  LUNGS:  Clear to auscultation bilaterally.  ABDOMEN:  Soft, round, nontender, positive bowel sounds x4.     LVAD exit site: well-healed and incorporated.  Dressing dry and intact.  No erythema or drainage.  Stabilization device present and accurately applied.  Driveline dressing is being changed daily per sterile technique. EXTREMITIES:  Warm and dry, no  cyanosis, clubbing, rash or edema  NEUROLOGIC:  Alert and oriented x 3.    No aphasia.  No dysarthria.  Affect flat     Telemetry   SR 60-70s   Labs   Basic Metabolic Panel: Recent Labs  Lab 06/18/21 0126 06/19/21 0138 06/20/21 0336 06/21/21 0440 06/22/21 0013  NA 131* 130* 132* 135 134*  K 4.0 4.0 4.6 4.5 4.6  CL 97* 96* 100 102 101  CO2 28 25 25 27 26   GLUCOSE 86 113* 106* 93 89  BUN 18 11 12 11 11   CREATININE 1.40* 1.35* 1.24 1.40* 1.36*  CALCIUM 8.7* 8.7* 8.8* 8.8* 8.5*  MG  --  1.7  --   --   --     Liver Function Tests: No results for input(s): AST, ALT, ALKPHOS, BILITOT, PROT, ALBUMIN in the last 168 hours.   No results for input(s): LIPASE, AMYLASE in the last 168 hours. No results for input(s): AMMONIA in the last 168 hours.   CBC: Recent Labs  Lab 06/18/21 0126 06/19/21 0138 06/20/21 0336 06/21/21 0440 06/22/21 0013  WBC 10.3 12.2* 14.8* 11.5* 11.3*  HGB 12.4* 12.6* 13.1 13.2 12.0*  HCT 38.2* 39.9 41.3 41.2 38.8*  MCV 83.4 83.1 82.9 83.6 85.1  PLT 217 224 217 200 202    INR: Recent Labs  Lab 06/18/21 0126 06/19/21 0138 06/20/21 0336 06/21/21 0440 06/22/21 0013  INR 1.1 1.0 1.1 1.0 1.1    Other results:    Imaging   CT HEAD WO CONTRAST (5MM)  Result  Date: 06/21/2021 CLINICAL DATA:  Subarachnoid hemorrhage. EXAM: CT HEAD WITHOUT CONTRAST TECHNIQUE: Contiguous axial images were obtained from the base of the skull through the vertex without intravenous contrast. RADIATION DOSE REDUCTION: This exam was performed according to the departmental dose-optimization program which includes automated exposure control, adjustment of the mA and/or kV according to patient size and/or use of iterative reconstruction technique. COMPARISON:  06/19/2021. FINDINGS: Brain: There is redemonstration of acute hemorrhage in the frontal lobe and basal ganglia on the right along the tract of a ventriculostomy catheter which is not significantly changed from the  prior exam. The ventriculoperitoneal shunt is no longer seen and is likely been removed. There is surrounding vasogenic edema with mass effect and midline shift to the left of approximally 8 mm at the level of the septum pellucidum. Minimal residual blood products are noted in the posterior horn of the right lateral ventricle. The ventricles are stable in size and morphology. Periventricular white matter hypodensities are noted bilaterally. There is mild encephalomalacia in the frontal lobe on the left possibly related to old infarct. An old lacunar infarct is noted in the basal ganglia on the right. Vascular: No hyperdense vessel or unexpected calcification. Skull: No acute fracture. A burr hole is present in the frontal bone on the right. Sinuses/Orbits: No acute finding. Other: Surgical clips and a small scalp hematoma are present over the frontal bone on the right. IMPRESSION: 1. Interval removal of right ventriculostomy catheter. 2. Stable intraparenchymal hemorrhage in the frontal lobe and basal ganglia on the right. Midline shift is slightly reduced from the prior exam. 3. Small scalp hematoma over the frontal bone on the right. 4. Remaining findings are unchanged. Electronically Signed   By: Brett Fairy M.D.   On: 06/21/2021 20:54     Medications:     Scheduled Medications:  amiodarone  200 mg Oral Daily   atorvastatin  40 mg Oral Daily   Chlorhexidine Gluconate Cloth  6 each Topical Q0600   docusate sodium  200 mg Oral Daily   gabapentin  300 mg Oral BID   losartan  25 mg Oral Daily   magnesium oxide  400 mg Oral BID   mouth rinse  15 mL Mouth Rinse BID   pantoprazole  40 mg Oral Daily   polyethylene glycol  17 g Oral Daily   senna  2 tablet Oral QHS   tamsulosin  0.4 mg Oral Daily   traZODone  50 mg Oral QHS   warfarin  1 mg Oral ONCE-1600   Warfarin - Pharmacist Dosing Inpatient   Does not apply q1600    Infusions:  sodium chloride Stopped (06/19/21 1210)    ceFAZolin (ANCEF)  IV Stopped (06/22/21 0534)    PRN Medications: sodium chloride, acetaminophen, butalbital-acetaminophen-caffeine, hydrALAZINE, ipratropium-albuterol, ondansetron (ZOFRAN) IV, oxyCODONE, polyethylene glycol, traMADol    Assessment/Plan:    1. Intracerebral hemorrhage - CT brain with intracerebral and intraventricular hemorrhage with obstructive hydrocephalus. INR 2.4 - AC reversed with Kcentra/vit k in ER - Underwent bedside ventriculostomy on 2/11 - Initial ventriculostomy drain occluded. CT head 2/17 with increased hemorrhage in right lateral ventricle, slightly increased ventricular dilation.  On 2/18, ventriculostomy drain replaced.  - CT 2/25 improved ventriculomegaly. Drain pulled 2/25 - CT 2/28 . stable intraparenchymal hemorrhage in the frontal lobe and basal ganglia on the right. Midline shift is slightly reduced from the prior exam. - Neuro Surgery appreciated. Continues to improve.    2. Chronic systolic CHF with HM III LVAD implant 04/20/17:  -  Has done well with VAD support. - He has been seen at [facility] for transplant referral but has decided not to pursue transplant. No change - New oxygen requirement with pulmonary edema on 2/17 CXR => good diuresis on 2/18.  - Volume status stable.  - 2/27 Coumadin ordered not but given. Starting today.  -  INR 1. LDH stable.   3. Paroxysmal AFL with RVR - had DC-CV on 10/17/18 with subsequent short episodes of AF after but has been mostly in NSR on low-dose amio (100 daily) - Had recurrent AF in 10/22.  - maintaining SR.  - Continue amio 200 mg daily - Start coumadin today.  (Ordered 2/27)   4. h/o UGI AVM bleed - s/p APC x 4 lesions in 2/19 - No recurrent bleeding  - Continue PPI - Off ASA due to GIB.  - Hgb stable 12    5. AKI on CKD Stage IIIa:  - Creatinine baseline 1.3 - 1.7  - Creatinine stabilized 1.36 today  6. VAD management - VAD interrogated personally. Parameters stable. - LDH has been stable 216 today - INR  goal 2.0-2.5 Off ASA due to GIB 2/19.  - INR 2.4 reversed due to [place]. INR 1 today  - Starting Coumadin today.   - MAPs 70 -90s  - DL site looks good   6. HTN - goal MAP 70-90 with ICH  - losartan increased 2/14 with resultant hypotension and AKI.Hydralazine stopped.  - Losartan decreased to 25 mg daily on 02/18 d/t bump in Scr.  -Stable today.   7. CAD:   - s/p PCI RCA/PLOM with DES x2 and DES to mild LAD 1/18 - No chest pain.  - Continue statin. Off ASA due to GIB   8. Hypomagnesemia. -  Check mag    9. Hyponatremia - Received tolvaptan 2/18, Na 134 today -Follow daily.   PT/OT following. CIR consulted.   Remove foley.   Length of Stay: [place], NP 06/22/2021, 8:27 AM  VAD Team --- VAD ISSUES ONLY--- Pager [phone] (7am - 7am)  Advanced Heart Failure Team  Pager [phone] (M-F; 7a - 5p)  Please contact [facility] for night-coverage after hours (5p -7a ) and weekends on [email or web address]   Patient seen and examined with the above-signed Advanced Practice Provider and/or Housestaff. I personally reviewed laboratory data, imaging studies and relevant notes. I independently examined the patient and formulated the important aspects of the plan. I have edited the note to reflect any of my changes or salient points. I have personally discussed the plan with the patient and/or family.  Still with HA. CT from yesterday reviewed. No change. Have started back on warfarin.   MAPs and volume status ok. Denies SOB, orthopnea or PND.  General:  NAD.  HEENT: normal  Neck: supple. JVP not elevated.  Carotids 2+ bilat; no bruits. No lymphadenopathy or thryomegaly appreciated. Cor: LVAD hum.  Lungs: Clear. Abdomen:  soft, nontender, non-distended. No hepatosplenomegaly. No bruits or masses. Good bowel sounds. Driveline site clean. Anchor in place.  Extremities: no cyanosis, clubbing, rash. Warm no edema  Neuro: alert & oriented x 3. No focal deficits. Moves all 4 without problem    Still with HA. CT stable. Starting back warfarin slowly. MAPs and volume status stable.   VAD interrogated personally. Parameters stable.  Can move to [place] when bed available.   Glori Bickers, MD  9:21 AM

## 2021-06-22 NOTE — Progress Notes (Addendum)
Occupational Therapy Treatment Patient Details Name: David [name] MRN: [id number] DOB: 1966/04/19 Today's Date: 06/22/2021   History of present illness 56 y.o. male who presented 06/05/21 with worsening HA. CT showed right basal ganglia hemorrhage, intraventricular hemorrhage and obstructive hydrocephalus. S/p placement of right frontal ventriculostomy via bur hole 2/11. 2/12 Repeat head CT with resolution of hydrocephalus. 2/25 drain pulled. PMH: ICD, HfrEF, HLD, HTN, s/p LVAD HM-III 04/20/18, CAD, CKD, MI, seizures   OT comments  Pt progressing towards established OT goals and demonstrating increased balance compared to prior sessions. Pt continues to present with decreased activity tolerance and cognition. Upon arrival, pt's wife just finished practicing switching pt to batteries with RN. Pt donning his underwear with overall Max A for sequencing, error recognition, and problem solving and Min physical A for sitting balance. Pt participating in therapeutic activity targeting balance, strength, standing tolerance, and cognition.Pt requiring Min Guard A for balance, Min-Mod cues for cognition, and several seated rest breaks. Demonstrating increased awareness to state when he needed a rest break. Pt continues to be very motivated to participate in therapy. Continue to recommend dc to home with HHOT and will continue to follow acutely as admitted.    Recommendations for follow up therapy are one component of a multi-disciplinary discharge planning process, led by the attending physician.  Recommendations may be updated based on patient status, additional functional criteria and insurance authorization.    Follow Up Recommendations  Home health OT    Assistance Recommended at Discharge Frequent or constant Supervision/Assistance  Patient can return home with the following  A little help with walking and/or transfers;A little help with bathing/dressing/bathroom;Direct supervision/assist for  medications management;Direct supervision/assist for financial management;Other (comment)   Equipment Recommendations  None recommended by OT    Recommendations for Other Services PT consult;Speech consult    Precautions / Restrictions Precautions Precautions: Fall;Other (comment) Precaution Comments: LVAD (x4 year hx)       Mobility Bed Mobility Overal bed mobility: Needs Assistance Bed Mobility: Supine to Sit     Supine to sit: Min guard     General bed mobility comments: min guard A for safety    Transfers Overall transfer level: Needs assistance Equipment used: None Transfers: Sit to/from Stand Sit to Stand: Min guard           General transfer comment: Min guard A for safety     Balance Overall balance assessment: Needs assistance Sitting-balance support: No upper extremity supported, Feet supported Sitting balance-Leahy Scale: Good     Standing balance support: No upper extremity supported, During functional activity Standing balance-Leahy Scale: Good                             ADL either performed or assessed with clinical judgement   ADL Overall ADL's : Needs assistance/impaired                     Lower Body Dressing: Minimal assistance;Sit to/from stand;Maximal assistance;Cueing for sequencing Lower Body Dressing Details (indicate cue type and reason): Pt doning underwear while seated at EOB requiring Max A over all due to cognition. Requiring Max cues to recognition error in his attempt and then problem solve a solution to compensatory for his decreased ROM. Also requiring cues for taking several rest breaks. Min A for sitting balance while bending forward. Min A to bring underwear over L heal as pt continued to attempt to pull underwear forward and  not back behind heel despite cues.         Tub/ Shower Transfer: Min guard;Ambulation   Functional mobility during ADLs: Min guard;Minimal assistance General ADL Comments: Pt  donning underwear and then engaging in 2-3 part theraputic activity. demonstrating improved balance and slight improvement of cognition.    Extremity/Trunk Assessment Upper Extremity Assessment Upper Extremity Assessment: Overall WFL for tasks assessed   Lower Extremity Assessment Lower Extremity Assessment: Defer to PT evaluation        Vision       Perception     Praxis      Cognition Arousal/Alertness: Awake/alert Behavior During Therapy: WFL for tasks assessed/performed Overall Cognitive Status: Impaired/Different from baseline                                 General Comments: Pt continues to be highly motivated. Continues to present wiht tangient and socially inappropiate thoughts/comments (such as, "I would blow you away if I farted right now). At begining of session, pt donning pants and requiring max cues for recognize error and then problem solving a solution. Pt participating in 2-3 part task and demonstrating increased problem solving. Continues to present with decreased error recognition but needing Min verbal cues instead of Mod. Pt also able to problem solve that his legs needs a rest break and solution finding to sit with Min-Mod questioning cues. Pt also engaging in simple math demonstrating following of two part task but Min cues for error recognition.        Exercises Exercises: Other exercises Other Exercises Other Exercises: Participating in 2-3 part therapeutic activity to challenge balance, activity tolerance, and cognition. 1) placing x in circles and then squares on large white board on wall. 2) placing x in circles, squares, and then triangle. 3) circling numbers that add up to 10 and then circling numbers that add up to 16. Pt requiring Min-Mod cues for error recognition. Min guard A for balance. Requiring seated rest breaks between each activity.    Shoulder Instructions       General Comments Wife present. VSS on 3L O2     Pertinent Vitals/ Pain       Pain Assessment Pain Assessment: No/denies pain  Home Living                                          Prior Functioning/Environment              Frequency  Min 3X/week        Progress Toward Goals  OT Goals(current goals can now be found in the care plan section)  Progress towards OT goals: Progressing toward goals  Acute Rehab OT Goals OT Goal Formulation: With patient/family Time For Goal Achievement: 07/06/21 Potential to Achieve Goals: Good ADL Goals Pt Will Perform Grooming: with modified independence;standing Pt Will Transfer to Toilet: with modified independence;ambulating;regular height toilet Additional ADL Goal #1: Pt will perform three part path finding task with Min cues Additional ADL Goal #2: Pt will manage LVAD batteries with Min cues  Plan Discharge plan remains appropriate    Co-evaluation                 AM-PAC OT "6 Clicks" Daily Activity     Outcome Measure   Help from another person eating meals?: A Little  Help from another person taking care of personal grooming?: A Little Help from another person toileting, which includes using toliet, bedpan, or urinal?: A Little Help from another person bathing (including washing, rinsing, drying)?: A Lot Help from another person to put on and taking off regular upper body clothing?: A Lot Help from another person to put on and taking off regular lower body clothing?: A Lot 6 Click Score: 15    End of Session Equipment Utilized During Treatment: Oxygen  OT Visit Diagnosis: Cognitive communication deficit (R41.841)   Activity Tolerance Patient tolerated treatment well   Patient Left in chair;with nursing/sitter in room;with family/visitor present   Nurse Communication Mobility status        Time: 1355-1430 OT Time Calculation (min): 35 min  Charges: OT General Charges $OT Visit: 1 Visit OT Treatments $Self Care/Home Management :  8-22 mins $Therapeutic Activity: 8-22 mins  Annaleese Guier MSOT, OTR/L Acute Rehab Pager: [phone] Office: [place] 06/22/2021, 4:21 PM

## 2021-06-23 DIAGNOSIS — I629 Nontraumatic intracranial hemorrhage, unspecified: Secondary | ICD-10-CM | POA: Diagnosis not present

## 2021-06-23 LAB — CBC
HCT: 39 % (ref 39.0–52.0)
Hemoglobin: 12.5 g/dL — ABNORMAL LOW (ref 13.0–17.0)
MCH: 26.9 pg (ref 26.0–34.0)
MCHC: 32.1 g/dL (ref 30.0–36.0)
MCV: 84.1 fL (ref 80.0–100.0)
Platelets: 199 10*3/uL (ref 150–400)
RBC: 4.64 MIL/uL (ref 4.22–5.81)
RDW: 14.2 % (ref 11.5–15.5)
WBC: 8.5 10*3/uL (ref 4.0–10.5)
nRBC: 0 % (ref 0.0–0.2)

## 2021-06-23 LAB — BASIC METABOLIC PANEL
Anion gap: 8 (ref 5–15)
BUN: 11 mg/dL (ref 6–20)
CO2: 26 mmol/L (ref 22–32)
Calcium: 8.7 mg/dL — ABNORMAL LOW (ref 8.9–10.3)
Chloride: 101 mmol/L (ref 98–111)
Creatinine, Ser: 1.37 mg/dL — ABNORMAL HIGH (ref 0.61–1.24)
GFR, Estimated: >60 mL/min (ref >60)
Glucose, Bld: 90 mg/dL (ref 70–99)
Potassium: 4.1 mmol/L (ref 3.5–5.1)
Sodium: 135 mmol/L (ref 135–145)

## 2021-06-23 LAB — PROTIME-INR
INR: 1.2 (ref 0.8–1.2)
Prothrombin Time: 14.7 seconds (ref 11.4–15.2)

## 2021-06-23 LAB — LACTATE DEHYDROGENASE: LDH: 158 U/L (ref 98–192)

## 2021-06-23 LAB — MAGNESIUM: Magnesium: 1.8 mg/dL (ref 1.7–2.4)

## 2021-06-23 MED ORDER — MAGNESIUM SULFATE 2 GM/50ML IV SOLN
2.0000 g | Freq: Once | INTRAVENOUS | Status: AC
  Administered 2021-06-23: 2 g via INTRAVENOUS
  Filled 2021-06-23: qty 50

## 2021-06-23 MED ORDER — CHLORHEXIDINE GLUCONATE CLOTH 2 % EX PADS
6.0000 | MEDICATED_PAD | Freq: Every day | CUTANEOUS | Status: DC
  Administered 2021-06-23 – 2021-06-28 (×6): 6 via TOPICAL

## 2021-06-23 MED ORDER — WARFARIN SODIUM 1 MG PO TABS
1.0000 mg | ORAL_TABLET | Freq: Once | ORAL | Status: AC
  Administered 2021-06-23: 1 mg via ORAL
  Filled 2021-06-23: qty 1

## 2021-06-23 NOTE — Care Management Important Message (Signed)
Important Message ? ?Patient Details  ?Name: David [name] ?MRN: [id number] ?Date of Birth: 04-17-1966 ? ? ?Medicare Important Message Given:  Yes ? ? ? ? ?Nikeisha Klutz ?06/23/2021, 12:58 PM ?

## 2021-06-23 NOTE — Progress Notes (Addendum)
ANTICOAGULATION CONSULT NOTE ? ?Pharmacy Consult for warfarin ?Indication:  LVAD ? ?Allergies  ?Allergen Reactions  ? Plavix [Clopidogrel Bisulfate] Hives  ? ? ?Patient Measurements: ?Height: 5\' 5"  (165.1 cm) ?Weight: 78.3 kg (172 lb 9.9 oz) ?IBW/kg (Calculated) : 61.5 ? ?Vital Signs: ?Temp: 98.3 ?F (36.8 ?C) (03/01 0309) ?Temp Source: Axillary (03/01 0309) ?BP: 97/83 (03/01 0309) ?Pulse Rate: 98 (03/01 0309) ? ?Labs: ?Recent Labs  ?  06/21/21 ?0440 06/22/21 ?0013 06/23/21 ?7106  ?HGB 13.2 12.0* 12.5*  ?HCT 41.2 38.8* 39.0  ?PLT 200 202 199  ?LABPROT 13.3 13.7 14.7  ?INR 1.0 1.1 1.2  ?CREATININE 1.40* 1.36* 1.37*  ? ? ? ?Estimated Creatinine Clearance: 57.4 mL/min (A) (by C-G formula based on SCr of 1.37 mg/dL (H)). ? ? ?Medical History: ?Past Medical History:  ?Diagnosis Date  ? AICD (automatic cardioverter/defibrillator) present   ? Anemia   ? Anxiety   ? CAD (coronary artery disease) 2009  ? AMI in 12/2007 with PCI to LAD, staged PCI to  mid/distal RCA, NSTEMI in 02/2009 with BMS to LCx  ? CHF (congestive heart failure) ([place])   ? Chronic kidney disease 11/03/2016  ? stage 3 kidney disease  ? Dysrhythmia   ? "arrythmia problems at some point", "fatal rhythms"  ? History of blood transfusion   ? "I was bleeding from was rectum" (04/19/2017)  ? HLD (hyperlipidemia)   ? HTN (hypertension)   ? Hyperlipidemia 03/15/2011  ? Ischemic cardiomyopathy   ? Admitted in 07/2010 with CHF exacerbation  ? Myocardial infarction [facility])   ? "I've had 4" (04/19/2017)  ? Pneumonia 2018 X 1  ? Seizures ([place])   ? one seizure in 04/2016 during cardiac event (04/19/2017)  ? STEMI 2009 (anterior), 2010 (lateral), and 2012 (inferior) 03/14/2011  ? ? ?Assessment: ?24 yoM on warfarin PTA for hx HM3 LVAD. Pt admitted 2/11 with ICH requiring ventriculostomy. INR 2.4 on admit reversed with vitamin K and 4FPCC. Ventriculostomy drain removed 2/25. ? ?Warfarin ordered to resume 2/27 but not given, head CT stable. Also ordered for 2/28, but not given  will personally discuss with RN to ensure given tonight. INR 1.2 as expected. CBC and LDH stable. ? ?*Home warfarin dose 4mg  Fri, 2mg  AODs ? ?Goal of Therapy:  ?INR 2.0-2.5 ?Monitor platelets by anticoagulation protocol: Yes ?  ?Plan:  ?Warfarin 1mg  PO x1 tonight ?Daily INR ? ?Cathrine Muster, PharmD ?PGY2 Cardiology Pharmacy Resident ?Phone: [phone] ?06/23/2021  7:52 AM ? ?Please check [email or web address] for unit-specific pharmacy phone numbers.  ? ? ? ?

## 2021-06-23 NOTE — Progress Notes (Addendum)
Patient ID: David [name], male   DOB: Mar 30, 1966, 56 y.o.   MRN: [id number]   Advanced Heart Failure VAD Team Note  PCP-Cardiologist: Glori Bickers, MD   Subjective:    2/11 Presented with [place] 2/11 Underwent bedside ventriculostomy wit NSU 2/12 Repeat head CT with resolution of hydrocephalus. Persistent hematoma 2/17 Repeat CT head with increased hemorrhage in right lateral ventricle, slightly increased ventricular dilation.  The ventriculostomy drain is occluded.  2/18 Ventriculostomy drain replaced 2/25 CT improved ventriculomegaly. Drain pulled 2/27 F/u CT w/ stable intraparenchymal hemorrhage. Unchanged from prior.     Coumadin was to be resumed last PM. Per MAR, dose not appear to be given. INR 1.2 Hgb stable 12.5   Still w/ HA, 5/5, and stable. MAPs ok, 80s. Wt stable. Denies dyspnea.   Still w/ trouble voiding, requiring frequent I/O caths. On Flomax.   Mg 1.8   LVAD INTERROGATION:  HeartMate 3 LVAD:   Flow 4.5  liters/min, speed 5450, power 4.1 PI 4.3. no PI events. VAD interrogated personally. Parameters stable.   Objective:    Vital Signs:   Temp:  [97.8 F (36.6 C)-98.8 F (37.1 C)] 98.3 F (36.8 C) (03/01 0309) Pulse Rate:  [63-98] 98 (03/01 0309) Resp:  [11-18] 13 (03/01 0309) BP: (71-117)/(50-88) 97/83 (03/01 0309) SpO2:  [92 %-100 %] 100 % (03/01 0309) Weight:  [78.3 kg] 78.3 kg (03/01 0620) Last BM Date : 06/20/21 Mean arterial Pressure  80-90s Intake/Output:   Intake/Output Summary (Last 24 hours) at 06/23/2021 0710 Last data filed at 06/23/2021 0017 Gross per 24 hour  Intake 169.96 ml  Output 810 ml  Net -640.04 ml     Physical Exam   GENERAL: fatigued appearing, laying in bed. No distress.  HEENT: sutures scalp on right.  NECK: Supple, JVP not elevated. 2+ bilaterally, no bruits.  No lymphadenopathy or thyromegaly appreciated.   CARDIAC:  Mechanical heart sounds with LVAD hum present.  LUNGS:  CTAB, no wheezing  ABDOMEN:  Soft, round,  nontender, positive bowel sounds x4.     LVAD exit site: well-healed and incorporated.  Dressing dry and intact.  No erythema or drainage.  Stabilization device present and accurately applied.  Driveline dressing is being changed daily per sterile technique. EXTREMITIES:  Warm and dry, no cyanosis, clubbing, rash or edema  NEUROLOGIC:  Alert and oriented x 3.    No aphasia.  No dysarthria.  Affect flat     Telemetry   NSR 70s, personally reviewed   Labs   Basic Metabolic Panel: Recent Labs  Lab 06/19/21 0138 06/20/21 0336 06/21/21 0440 06/22/21 0013 06/23/21 0046  NA 130* 132* 135 134* 135  K 4.0 4.6 4.5 4.6 4.1  CL 96* 100 102 101 101  CO2 25 25 27 26 26   GLUCOSE 113* 106* 93 89 90  BUN 11 12 11 11 11   CREATININE 1.35* 1.24 1.40* 1.36* 1.37*  CALCIUM 8.7* 8.8* 8.8* 8.5* 8.7*  MG 1.7  --   --  1.7 1.8    Liver Function Tests: No results for input(s): AST, ALT, ALKPHOS, BILITOT, PROT, ALBUMIN in the last 168 hours.   No results for input(s): LIPASE, AMYLASE in the last 168 hours. No results for input(s): AMMONIA in the last 168 hours.   CBC: Recent Labs  Lab 06/19/21 0138 06/20/21 0336 06/21/21 0440 06/22/21 0013 06/23/21 0046  WBC 12.2* 14.8* 11.5* 11.3* 8.5  HGB 12.6* 13.1 13.2 12.0* 12.5*  HCT 39.9 41.3 41.2 38.8* 39.0  MCV  83.1 82.9 83.6 85.1 84.1  PLT 224 217 200 202 199    INR: Recent Labs  Lab 06/19/21 0138 06/20/21 0336 06/21/21 0440 06/22/21 0013 06/23/21 0046  INR 1.0 1.1 1.0 1.1 1.2    Other results:    Imaging   CT HEAD WO CONTRAST (5MM)  Result Date: 06/21/2021 CLINICAL DATA:  Subarachnoid hemorrhage. EXAM: CT HEAD WITHOUT CONTRAST TECHNIQUE: Contiguous axial images were obtained from the base of the skull through the vertex without intravenous contrast. RADIATION DOSE REDUCTION: This exam was performed according to the departmental dose-optimization program which includes automated exposure control, adjustment of the mA and/or kV  according to patient size and/or use of iterative reconstruction technique. COMPARISON:  06/19/2021. FINDINGS: Brain: There is redemonstration of acute hemorrhage in the frontal lobe and basal ganglia on the right along the tract of a ventriculostomy catheter which is not significantly changed from the prior exam. The ventriculoperitoneal shunt is no longer seen and is likely been removed. There is surrounding vasogenic edema with mass effect and midline shift to the left of approximally 8 mm at the level of the septum pellucidum. Minimal residual blood products are noted in the posterior horn of the right lateral ventricle. The ventricles are stable in size and morphology. Periventricular white matter hypodensities are noted bilaterally. There is mild encephalomalacia in the frontal lobe on the left possibly related to old infarct. An old lacunar infarct is noted in the basal ganglia on the right. Vascular: No hyperdense vessel or unexpected calcification. Skull: No acute fracture. A burr hole is present in the frontal bone on the right. Sinuses/Orbits: No acute finding. Other: Surgical clips and a small scalp hematoma are present over the frontal bone on the right. IMPRESSION: 1. Interval removal of right ventriculostomy catheter. 2. Stable intraparenchymal hemorrhage in the frontal lobe and basal ganglia on the right. Midline shift is slightly reduced from the prior exam. 3. Small scalp hematoma over the frontal bone on the right. 4. Remaining findings are unchanged. Electronically Signed   By: Brett Fairy M.D.   On: 06/21/2021 20:54     Medications:     Scheduled Medications:  amiodarone  200 mg Oral Daily   atorvastatin  40 mg Oral Daily   docusate sodium  200 mg Oral Daily   gabapentin  300 mg Oral BID   losartan  25 mg Oral Daily   magnesium oxide  400 mg Oral BID   mouth rinse  15 mL Mouth Rinse BID   pantoprazole  40 mg Oral Daily   polyethylene glycol  17 g Oral Daily   senna  2 tablet  Oral QHS   tamsulosin  0.4 mg Oral Daily   traZODone  50 mg Oral QHS   warfarin  1 mg Oral ONCE-1600   Warfarin - Pharmacist Dosing Inpatient   Does not apply q1600    Infusions:  sodium chloride Stopped (06/19/21 1210)   magnesium sulfate bolus IVPB      PRN Medications: sodium chloride, acetaminophen, butalbital-acetaminophen-caffeine, hydrALAZINE, ipratropium-albuterol, ondansetron (ZOFRAN) IV, oxyCODONE, polyethylene glycol, traMADol    Assessment/Plan:    1. Intracerebral hemorrhage - CT brain with intracerebral and intraventricular hemorrhage with obstructive hydrocephalus. INR 2.4 - AC reversed with Kcentra/vit k in ER - Underwent bedside ventriculostomy on 2/11 - Initial ventriculostomy drain occluded. CT head 2/17 with increased hemorrhage in right lateral ventricle, slightly increased ventricular dilation.  On 2/18, ventriculostomy drain replaced.  - CT 2/25 improved ventriculomegaly. Drain pulled 2/25 - CT  2/27 . stable intraparenchymal hemorrhage in the frontal lobe and basal ganglia on the right. Midline shift is slightly reduced from the prior exam. - Neuro Surgery appreciated. Continues to improve.  - monitor w/ restart of coumadin    2. Chronic systolic CHF with HM III LVAD implant 04/20/17:  - Has done well with VAD support. - He has been [facility] at [facility] for transplant referral but has decided not to pursue transplant. No change - New oxygen requirement with pulmonary edema on 2/17 CXR => good diuresis on 2/18.  - Volume status stable.  - 2/27 and 2/28 Coumadin ordered not but given. Starting today. D/w Nursing staff and pharmacy -  INR 1.2 LDH stable.   3. Paroxysmal AFL with RVR - had DC-CV on 10/17/18 with subsequent short episodes of AF after but has been mostly in NSR on low-dose amio (100 daily) - Had recurrent AF in 10/22.  - maintaining SR.  - Continue amio 200 mg daily - Start coumadin today.  (Ordered 2/27 and 2/28)   4. h/o UGI AVM bleed - s/p  APC x 4 lesions in 2/19 - No recurrent bleeding  - Continue PPI - Off ASA due to GIB.  - Hgb stable 12    5. AKI on CKD Stage IIIa:  - Creatinine baseline 1.3 - 1.7  - Creatinine stabilized 1.37 today  6. VAD management - VAD interrogated personally. Parameters stable. - LDH has been stable 158 today - INR goal 2.0-2.5 Off ASA due to GIB 2/19.  - INR 2.4 reversed due to [place]. INR 1.2 today  - Starting Coumadin today.   - MAPs 70 -90s  - DL site looks good   6. HTN - goal MAP 70-90 with ICH  - losartan increased 2/14 with resultant hypotension and AKI. Hydralazine stopped.  - Losartan decreased to 25 mg daily on 02/18 d/t bump in Scr.  -Stable today.   7. CAD:   - s/p PCI RCA/PLOM with DES x2 and DES to mild LAD 1/18 - No chest pain.  - Continue statin. Off ASA due to GIB   8. Hypomagnesemia. - Mg 1.8  - supp w/ 2g   9. Hyponatremia - Received tolvaptan 2/18, Na 135 today - Follow daily.   10. Urinary retention - continue Flomax  - Put Foley back in  PT/OT following. Not candidate for CIR. Plan HH PT.    Remove foley.   Length of Stay: [address] PA-C 06/23/2021, 7:10 AM  VAD Team --- VAD ISSUES ONLY--- Pager [phone] (7am - 7am)  Advanced Heart Failure Team  Pager [phone] (M-F; 7a - 5p)  Please contact [facility] for night-coverage after hours (5p -7a ) and weekends on [email or web address]  Patient seen and examined with the above-signed Advanced Practice Provider and/or Housestaff. I personally reviewed laboratory data, imaging studies and relevant notes. I independently examined the patient and formulated the important aspects of the plan. I have edited the note to reflect any of my changes or salient points. I have personally discussed the plan with the patient and/or family.  Stil with HA but no other neuro symptoms. MAPs, volume status and VAD parameters stable.   Still with urinary retention requiring frequent I/O cath. Did not qualify for  CIR  General:  NAD.  HEENT: normal  Neck: supple. JVP not elevated.  Carotids 2+ bilat; no bruits. No lymphadenopathy or thryomegaly appreciated. Cor: LVAD hum.  Lungs: Clear. Abdomen: soft, nontender, non-distended. No hepatosplenomegaly. No bruits or  masses. Good bowel sounds. Driveline site clean. Anchor in place.  Extremities: no cyanosis, clubbing, rash. Warm no edema  + tremor Neuro: alert & oriented x 3. No focal deficits. Moves all 4 without problem   Warfarin ordered past two nights but not given by nursing staff. D/w Nursing and Pharmacy. Will get today. Would place Foley back in. Continue Flomax  VAD interrogated personally. Parameters stable.  Watch MAPs. Needs aggressive PT/OT.   Glori Bickers, MD  8:54 AM

## 2021-06-23 NOTE — Progress Notes (Signed)
Physical Therapy Treatment Patient Details Name: David [name] MRN: [id number] DOB: [birth year] Today's Date: 06/23/2021   History of Present Illness 56 y.o. male who presented 06/05/21 with worsening HA. CT showed right basal ganglia hemorrhage, intraventricular hemorrhage and obstructive hydrocephalus. S/p placement of right frontal ventriculostomy via bur hole 2/11. 2/12 Repeat head CT with resolution of hydrocephalus. 2/25 drain pulled. PMH: ICD, HfrEF, HLD, HTN, s/p LVAD HM-III 04/20/18, CAD, CKD, MI, seizures    PT Comments    Checked on pt earlier this AM but pt with headache. Upon return in afternoon, pt still with headache and appeared fatigued. Pt with noted STM deficits, forgetting a conversation conducted <60 sec prior, along with noted L leg and arm twitching intermittently (pt conscious throughout and conversing). Pt also now leaning posteriorly (more so in sitting than standing) rather than laterally. Due to a change in his symptoms noted above, decided to limit session to bedroom distance mobility for safety. However, pt did well once standing and was able to even tolerate dynamic gait challenges of stepping laterally and posteriorly and changing directions without UE support. Pt with x1 LOB and intermittent need for minA to maintain balance with gait challenges. Suspect pt's initial decline in function at start of session may have been secondary to fatigue with it being later in the day. Notified RN though to keep updated on signs. Will continue to follow acutely. Recommending HHPT at this time provided pt progresses as anticipated.   Recommendations for follow up therapy are one component of a multi-disciplinary discharge planning process, led by the attending physician.  Recommendations may be updated based on patient status, additional functional criteria and insurance authorization.  Follow Up Recommendations  Home health PT     Assistance Recommended at Discharge Frequent or  constant Supervision/Assistance  Patient can return home with the following A little help with bathing/dressing/bathroom;Assistance with cooking/housework;Direct supervision/assist for financial management;Direct supervision/assist for medications management;Assist for transportation;A little help with walking and/or transfers;Help with stairs or ramp for entrance   Equipment Recommendations  Rollator (4 wheels);BSC/3in1    Recommendations for Other Services       Precautions / Restrictions Precautions Precautions: Fall;Other (comment) Precaution Comments: LVAD (x4 year hx) Restrictions Weight Bearing Restrictions: No     Mobility  Bed Mobility Overal bed mobility: Needs Assistance Bed Mobility: Supine to Sit, Sit to Supine     Supine to sit: Min guard, HOB elevated Sit to supine: Min guard, HOB elevated   General bed mobility comments: Extra time and cues to use bed rail to pull self up to sit EOB and to laterally scoot upper half of body in bed to obtain midline alignment. Difficulty raising legs onto bed but able to do so with extra time and effort.    Transfers Overall transfer level: Needs assistance Equipment used: 1 person hand held assist Transfers: Sit to/from Stand Sit to Stand: Min assist           General transfer comment: MinA to power up to stand and transition weight anteriorly initially.    Ambulation/Gait Ambulation/Gait assistance: Min guard, Min assist Gait Distance (Feet): 80 Feet Assistive device: None Gait Pattern/deviations: Step-through pattern, Decreased stride length Gait velocity: reduced Gait velocity interpretation: <1.31 ft/sec, indicative of household ambulator   General Gait Details: Pt did not appear to have lateral lean this session, but rather a slight posterior lean that appeared to improve the longer he was standing. Pt with x1 LOB walking around EOB, needing minA to recover,  otherwise varied from min guard-minA for stability  taking steps anterior, posterior, and lateral in room. Limited pt to room mobility due to shaking on L side and difficulty maintaining stability sitting EOB prior to standing.   Stairs             Wheelchair Mobility    Modified Rankin (Stroke Patients Only) Modified Rankin (Stroke Patients Only) Pre-Morbid Rankin Score: No symptoms Modified Rankin: Moderately severe disability     Balance Overall balance assessment: Needs assistance Sitting-balance support: Feet supported, Bilateral upper extremity supported, No upper extremity supported Sitting balance-Leahy Scale: Poor Sitting balance - Comments: Pt with posterior lean today, needing up to modA intermittently to prevent LOB. Periods of min guard assist when pt would lean anteriorly. Postural control: Posterior lean Standing balance support: During functional activity, No upper extremity supported Standing balance-Leahy Scale: Fair Standing balance comment: Takes steps and able to stand without UE support, but with slight posterior lean needing up to minA to stand today.             High level balance activites: Side stepping, Backward walking              Cognition Arousal/Alertness: Awake/alert Behavior During Therapy: Flat affect Overall Cognitive Status: Impaired/Different from baseline Area of Impairment: Attention, Memory, Following commands, Safety/judgement, Awareness, Problem solving                   Current Attention Level: Sustained Memory: Decreased recall of precautions, Decreased short-term memory Following Commands: Follows one step commands with increased time, Follows one step commands consistently Safety/Judgement: Decreased awareness of safety, Decreased awareness of deficits Awareness: Intellectual Problem Solving: Slow processing, Decreased initiation, Difficulty sequencing, Requires verbal cues, Requires tactile cues General Comments: Pt unable to recall conversation just  conducted < 60 sec prior at one point. Poor awareness into deficits and safety, reporting "I'm not going anywhere" when asked if he was leaning at all, needing repeated max cues to correct posterior lean.        Exercises Other Exercises Other Exercises: Dynamic balance during gait cuing pt to step anterior, posterior and lateral    General Comments        Pertinent Vitals/Pain Pain Assessment Pain Assessment: Faces Faces Pain Scale: Hurts little more Pain Location: head Pain Descriptors / Indicators: Headache Pain Intervention(s): Limited activity within patient's tolerance, Monitored during session, Repositioned    Home Living                          Prior Function            PT Goals (current goals can now be found in the care plan section) Acute Rehab PT Goals Patient Stated Goal: to not have a headache PT Goal Formulation: With patient Time For Goal Achievement: 07/02/21 Potential to Achieve Goals: Good Progress towards PT goals: Progressing toward goals    Frequency    Min 3X/week      PT Plan Current plan remains appropriate    Co-evaluation              AM-PAC PT "6 Clicks" Mobility   Outcome Measure  Help needed turning from your back to your side while in a flat bed without using bedrails?: None Help needed moving from lying on your back to sitting on the side of a flat bed without using bedrails?: A Little Help needed moving to and from a bed to a chair (including a  wheelchair)?: A Little Help needed standing up from a chair using your arms (e.g., wheelchair or bedside chair)?: A Little Help needed to walk in hospital room?: A Little Help needed climbing 3-5 steps with a railing? : A Little 6 Click Score: 19    End of Session   Activity Tolerance: Patient limited by fatigue Patient left: with call bell/phone within reach;in bed;with bed alarm set Nurse Communication: Mobility status;Other (comment) (shaking in L side but while  talking to PT, STM issues, posterior lean) PT Visit Diagnosis: Unsteadiness on feet (R26.81);Other abnormalities of gait and mobility (R26.89);Difficulty in walking, not elsewhere classified (R26.2)     Time: 4562-5638 PT Time Calculation (min) (ACUTE ONLY): 36 min  Charges:  $Gait Training: 8-22 mins $Neuromuscular Re-education: 8-22 mins                     Moishe Spice, PT, DPT Acute Rehabilitation Services  Pager: [phone] Office: [place] 06/23/2021, 5:44 PM

## 2021-06-23 NOTE — Progress Notes (Signed)
LVAD Coordinator Rounding Note: ? ?Admitted 06/05/21 due to [place] with hydrocephalus to Dr Bensimhon's service. Ventriculostomy placed at bedside by Dr Arnoldo Morale.  ? ?HM III LVAD implanted on 04/20/17 by Dr Cyndia Bent under destination therapy criteria. ? ?EVD (external ventricular drain) was replaced x 2 on 06/12/21 by Dr. Zada Finders (Neuro). 2/25 CT improved ventriculomegaly. Drain pulled. Repeat head CT 2/27:  ?1. Interval removal of right ventriculostomy catheter. ?2. Stable intraparenchymal hemorrhage in the frontal lobe and basal ganglia on the right. Midline shift is slightly reduced from the prior exam. ?3. Small scalp hematoma over the frontal bone on the right. ?4. Remaining findings are unchanged. ? ?Per Dr Arnoldo Morale (neurosurgery) he will sign off for now. Pt will need follow up with him in his office in a few weeks. Will scheduled appt at discharge.  ? ?Pt laying in bed this morning. Reports he does not feel good as he has a 5/10 constant headache. He states he does not feel up to working with PT/OT today, or getting out of bed due to headache. He is receiving PRN Oxycodone, Tramadol, and Fioricet. Encouraged him to try to participate in PT/OT as able. He verbalized understanding.  ? ?Pt having difficulty voiding despite Flomax. He was I&O cathed a few times overnight. Foley replaced this morning.  ? ?Per MAR pt did not receive Warfarin the last two nights. Order in to receive this evening. Will closely monitor for neuro changes with anticoagulation restart.  ? ?Vital signs: ?Temp: 98.3 ?HR: 70 ?Doppler Pressure: 84 ?Automatic BP: 86/71 (78) ?O2 Sat: 94% on RA ?Wt:182.3>186>186.5>189.6>190.2>197.5>187.6>172>189.6>187.6>185.6>191>176.1>172.6 lbs   ? ?LVAD interrogation reveals:  ?Speed: 5400 ?Flow: 4.2 ?Power:  4.0w ?PI: 4.8 ?Hematocrit: 39 ? ?Alarms: none ?Events: none ? ?Fixed speed: 5400 ?Low speed limit: 5100 ? ?Drive Line: Dressing C/D/I with anchor intact and accurately applied.  Weekly dressing changes per  bedside RN or pt's wife. Next dressing change due: 06/28/21. ? ?Labs:  ?LDH trend: [phone] ? ?INR trend: 1.0>1.2>1.1>1.2>1.1>1.2>1.1>1.0>1.1>1.0>1.1>1.2 ? ? ?Anticoagulation Plan: ?-INR Goal: 2 - 2.5  (anticoagulation on hold currently due to [place]) ?-ASA Dose: none due to hx of GI bleed ? ?Device: ?- Medtronic BiV ?-Therapies: on- VF 200, VT 167 ? AT 171 monitored ?- Last check: 04/29/21 ? ? ?Adverse Events on VAD: ?- Readmitted 2/19 hgb 3.8. INR 2.9.transfused 4u pRBCs. EGD - normal esophagus, 4 nonbleeding gastric AVMs treated with APC, normal duodenum and jejunum ?- Underwent DC-CV for AFL on 10/17/18  ?- Admitted 06/05/21 for [place] with ventriculostomy placement.  ? ?Plan/Recommendations:  ?1. Call VAD coordinator for any VAD equipment or drive line issues ?2. Weekly drive line dressing change per bedside RN or pt's wife. Next dressing change due 06/28/21. ? ? ?Emerson Monte RN ?VAD Coordinator  ?Office: [phone]  ?24/7 Pager: [phone]  ? ? ?  ?

## 2021-06-23 NOTE — Plan of Care (Signed)

## 2021-06-24 LAB — CBC
HCT: 39.8 % (ref 39.0–52.0)
Hemoglobin: 12.6 g/dL — ABNORMAL LOW (ref 13.0–17.0)
MCH: 26.8 pg (ref 26.0–34.0)
MCHC: 31.7 g/dL (ref 30.0–36.0)
MCV: 84.7 fL (ref 80.0–100.0)
Platelets: 200 10*3/uL (ref 150–400)
RBC: 4.7 MIL/uL (ref 4.22–5.81)
RDW: 14.2 % (ref 11.5–15.5)
WBC: 10.2 10*3/uL (ref 4.0–10.5)
nRBC: 0 % (ref 0.0–0.2)

## 2021-06-24 LAB — BASIC METABOLIC PANEL
Anion gap: 10 (ref 5–15)
BUN: 11 mg/dL (ref 6–20)
CO2: 23 mmol/L (ref 22–32)
Calcium: 8.5 mg/dL — ABNORMAL LOW (ref 8.9–10.3)
Chloride: 100 mmol/L (ref 98–111)
Creatinine, Ser: 1.35 mg/dL — ABNORMAL HIGH (ref 0.61–1.24)
GFR, Estimated: >60 mL/min (ref >60)
Glucose, Bld: 84 mg/dL (ref 70–99)
Potassium: 4.3 mmol/L (ref 3.5–5.1)
Sodium: 133 mmol/L — ABNORMAL LOW (ref 135–145)

## 2021-06-24 LAB — LACTATE DEHYDROGENASE: LDH: 192 U/L (ref 98–192)

## 2021-06-24 LAB — PROTIME-INR
INR: 1.2 (ref 0.8–1.2)
Prothrombin Time: 14.9 seconds (ref 11.4–15.2)

## 2021-06-24 MED ORDER — TRAMADOL HCL 50 MG PO TABS
50.0000 mg | ORAL_TABLET | Freq: Two times a day (BID) | ORAL | Status: DC | PRN
  Administered 2021-06-24 – 2021-06-28 (×7): 50 mg via ORAL
  Filled 2021-06-24 (×7): qty 1

## 2021-06-24 MED ORDER — WARFARIN SODIUM 1 MG PO TABS
1.0000 mg | ORAL_TABLET | Freq: Once | ORAL | Status: AC
  Administered 2021-06-24: 1 mg via ORAL
  Filled 2021-06-24: qty 1

## 2021-06-24 MED ORDER — OXYCODONE HCL 5 MG PO TABS
5.0000 mg | ORAL_TABLET | Freq: Two times a day (BID) | ORAL | Status: DC | PRN
  Administered 2021-06-26: 10 mg via ORAL
  Administered 2021-06-27 (×2): 5 mg via ORAL
  Filled 2021-06-24 (×2): qty 1
  Filled 2021-06-24: qty 2

## 2021-06-24 NOTE — Progress Notes (Signed)
LVAD Coordinator Rounding Note: ? ?Admitted 06/05/21 due to [place] with hydrocephalus to Dr Bensimhon's service. Ventriculostomy placed at bedside by Dr Arnoldo Morale.  ? ?HM III LVAD implanted on 04/20/17 by Dr Cyndia Bent under destination therapy criteria. ? ?EVD (external ventricular drain) was replaced x 2 on 06/12/21 by Dr. Zada Finders (Neuro). 2/25 CT improved ventriculomegaly. Drain pulled. Repeat head CT 2/27:  ?1. Interval removal of right ventriculostomy catheter. ?2. Stable intraparenchymal hemorrhage in the frontal lobe and basal ganglia on the right. Midline shift is slightly reduced from the prior exam. ?3. Small scalp hematoma over the frontal bone on the right. ?4. Remaining findings are unchanged. ? ?Per Dr Arnoldo Morale (neurosurgery) he will sign off for now. Pt will need follow up with him in his office in a few weeks. Will scheduled appt at discharge.  ? ?Pt sitting up in recliner this morning. Reports he does not feel good as he has a 5/10 constant headache. Recently received PRN Oxycodone. Pt knew my name, and is alert to person and place this morning. At times responses are delayed. Encouraged continued participation with PT/OT despite headache. He verbalized understanding.  ? ?Foley replaced yesterday due to difficulty voiding.  ? ?Received Coumadin last night. Will closely monitor for neuro changes with anticoagulation restart.  ? ?Vital signs: ?Temp: 98.7 ?HR: 70 ?Doppler Pressure: 92 ?Automatic BP: 97/58 (85) ?O2 Sat: 94% on RA ?Wt:182.3>186>186.5>189.6>190.2>197.5>187.6>172>189.6>187.6>185.6>191>176.1>172.6>171.3 lbs   ? ?LVAD interrogation reveals:  ?Speed: 5400 ?Flow: 4.9 ?Power:  4.1w ?PI: 3.3 ?Hematocrit: 39 ? ?Alarms: none ?Events: none ? ?Fixed speed: 5400 ?Low speed limit: 5100 ? ?Drive Line: Dressing C/D/I with anchor intact and accurately applied.  Weekly dressing changes per bedside RN or pt's wife. Next dressing change due: 06/28/21. ? ?Labs:  ?LDH trend:  166>176>157>162>165>190>205>176>178>174>173>171>174>191>158>192 ? ?INR trend: 1.0>1.2>1.1>1.2>1.1>1.2>1.1>1.0>1.1>1.0>1.1>1.2>1.2 ? ? ?Anticoagulation Plan: ?-INR Goal: 2 - 2.5  (anticoagulation on hold currently due to [place]) ?-ASA Dose: none due to hx of GI bleed ? ?Device: ?- Medtronic BiV ?-Therapies: on- VF 200, VT 167 ? AT 171 monitored ?- Last check: 04/29/21 ? ? ?Adverse Events on VAD: ?- Readmitted 2/19 hgb 3.8. INR 2.9.transfused 4u pRBCs. 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  ? ?Plan/Recommendations:  ?1. Call VAD coordinator for any VAD equipment or drive line issues ?2. Weekly drive line dressing change per bedside RN or pt's wife. Next dressing change due 06/28/21. ? ? ?Emerson Monte RN ?VAD Coordinator  ?Office: [phone]  ?24/7 Pager: [phone]  ? ? ?  ?

## 2021-06-24 NOTE — Progress Notes (Signed)
Physical Therapy Treatment Patient Details Name: David [name] MRN: [id number] DOB: 06/24/1965 Today's Date: 06/24/2021   History of Present Illness 56 y.o. male who presented 06/05/21 with worsening HA. CT showed right basal ganglia hemorrhage, intraventricular hemorrhage and obstructive hydrocephalus. S/p placement of right frontal ventriculostomy via bur hole 2/11. 2/12 Repeat head CT with resolution of hydrocephalus. 2/25 drain pulled. PMH: ICD, HfrEF, HLD, HTN, s/p LVAD HM-III 04/20/18, CAD, CKD, MI, seizures    PT Comments    Pt continues to display a functional decline, now displaying a strong L lateral lean and even possible pushing syndrome. Pt benefiting from visual cues to identify his proximity to objects to correct his upright orientation, however only briefly. This L lateral lean along with his deficits in cognition, particularly awareness, resulted in him needing up to modA to sit statically EOB. These deficits along with his deficits in L upper and lower extremity proprioception/sensation and strength resulted in him needing up to modA to ambulate ~50 ft with a rollator today, needing cues for L hand placement as it would fall off the walker. Pt also with L knee instability and difficulty clearing his L foot today. Pt requiring minA to exit L side of bed due to L UE weakness and coordination deficits also, displaying difficulty pushing up off L UE to ascend trunk. Pt may be showing a functional decline these past 2 sessions secondary to fatigue from being treated later in the afternoon each time. Thus, plan to try to treat pt in the morning next session to assess any differences in functional performance between times of day. At this time, secondary to his functional decline, high motivation to improve, strong family support, and need to improve his endurance so as to reduce caregiver burden as the day progresses, pt would greatly benefit from AIR. Requesting AIR to re-evaluate pt  considering these changes. Will continue to follow acutely. May benefit from using a mirror to improve midline alignment next session.    Recommendations for follow up therapy are one component of a multi-disciplinary discharge planning process, led by the attending physician.  Recommendations may be updated based on patient status, additional functional criteria and insurance authorization.  Follow Up Recommendations  Acute inpatient rehab (3hours/day)     Assistance Recommended at Discharge Frequent or constant Supervision/Assistance  Patient can return home with the following Assistance with cooking/housework;Direct supervision/assist for financial management;Direct supervision/assist for medications management;Assist for transportation;Help with stairs or ramp for entrance;A lot of help with walking and/or transfers;A lot of help with bathing/dressing/bathroom;Assistance with feeding   Equipment Recommendations  Rollator (4 wheels);BSC/3in1 (rollator vs RW pending progression)    Recommendations for Other Services Rehab consult     Precautions / Restrictions Precautions Precautions: Fall;Other (comment) Precaution Comments: LVAD (x4 year hx) Restrictions Weight Bearing Restrictions: No     Mobility  Bed Mobility Overal bed mobility: Needs Assistance Bed Mobility: Supine to Sit, Sit to Supine     Supine to sit: HOB elevated, Min assist Sit to supine: HOB elevated, Min assist   General bed mobility comments: Exiting L side of bed today with HOB elevated. Increased time to bring legs off EOB, needing cues to stay on task. Pt able to get ~50% off EOB with legs but unable to sequence how to scoot hips to EOB even with cues. Pt repeatedly trying to push up on L elbow but unable to ascend. MinA to bring hips to edge and ascend trunk. MinA to lift legs onto bed and direct  trunk midline to return to supine. Mod-maxA to scoot laterally or superiorly in bed.    Transfers Overall  transfer level: Needs assistance Equipment used: Rollator (4 wheels) Transfers: Sit to/from Stand Sit to Stand: Min assist           General transfer comment: Cues to push up from bed, minA to transition weight anteriorly and power up to stand. Pt not placing L hand on walker grip as he did on R automatically, needing cues to look and correct L hand placement.    Ambulation/Gait Ambulation/Gait assistance: Min assist, Mod assist Gait Distance (Feet): 50 Feet Assistive device: Rollator (4 wheels) Gait Pattern/deviations: Step-through pattern, Decreased stride length, Staggering left, Trunk flexed, Knee flexed in stance - left, Decreased step length - left, Narrow base of support Gait velocity: reduced Gait velocity interpretation: <1.31 ft/sec, indicative of household ambulator   General Gait Details: Pt with L lateral lean that increased along with his L knee instability and L drift placing L foot outside/lateral of rollator as distance progressed. Attempted to cue pt to align body with rollator seat, initially appeared successful, but as pt fatigued he was unable to correct even though he identified that his L foot was closer to a wheel than the R was. Pt leaning hard to the L needing modA to prevent LOB by the final half of the gait bout. LOB needing modA to recover at start of gait bout also.   Stairs             Wheelchair Mobility    Modified Rankin (Stroke Patients Only) Modified Rankin (Stroke Patients Only) Pre-Morbid Rankin Score: No symptoms Modified Rankin: Moderately severe disability     Balance Overall balance assessment: Needs assistance Sitting-balance support: Feet supported, Bilateral upper extremity supported, No upper extremity supported, Single extremity supported Sitting balance-Leahy Scale: Poor Sitting balance - Comments: Pt pushing self with R UE to his L. Max cues provided to align self with PT anterior to him but pt unable to identify he was not  aligned initially. Improved midline orientation and alignment with PT once pt was placed leaning onto R elbow for >60 sec. Pt resisting initially with propping on R elbow. Pt also able to momentarily correct L lean when cued to visually see how proximal he was to an object on his L. Postural control: Posterior lean, Left lateral lean Standing balance support: During functional activity, Bilateral upper extremity supported, Reliant on assistive device for balance Standing balance-Leahy Scale: Poor Standing balance comment: Pt with L lateral lean relying on rollator and up to modA to prevent LOB today                            Cognition Arousal/Alertness: Awake/alert Behavior During Therapy: Flat affect Overall Cognitive Status: Impaired/Different from baseline Area of Impairment: Attention, Memory, Following commands, Safety/judgement, Awareness, Problem solving                   Current Attention Level: Sustained Memory: Decreased recall of precautions, Decreased short-term memory Following Commands: Follows one step commands with increased time, Follows one step commands inconsistently Safety/Judgement: Decreased awareness of safety, Decreased awareness of deficits Awareness: Intellectual Problem Solving: Slow processing, Decreased initiation, Difficulty sequencing, Requires verbal cues, Requires tactile cues General Comments: Pt does not remember PT from prior day. Poor awareness into deficits, leaning laterally and even pushing self to L despite max encouragement and multi-modal cues to acknowledge and fix and  gain midline. Pt able to identify he was closer to L wheel of walker when cued to look but unable to correct it. Appears to benefit from visual cues to identify proximity to objects.        Exercises Other Exercises Other Exercises: pt attempting to find midline in sitting and standing, benefiting from visual cues    General Comments        Pertinent  Vitals/Pain Pain Assessment Pain Assessment: Faces Faces Pain Scale: Hurts little more Pain Location: head Pain Descriptors / Indicators: Headache Pain Intervention(s): Limited activity within patient's tolerance, Monitored during session, Repositioned    Home Living                          Prior Function            PT Goals (current goals can now be found in the care plan section) Acute Rehab PT Goals Patient Stated Goal: to get better PT Goal Formulation: With patient Time For Goal Achievement: 07/02/21 Potential to Achieve Goals: Good Progress towards PT goals: Not progressing toward goals - comment (decline in functional status)    Frequency    Min 4X/week      PT Plan Discharge plan needs to be updated;Frequency needs to be updated    Co-evaluation              AM-PAC PT "6 Clicks" Mobility   Outcome Measure  Help needed turning from your back to your side while in a flat bed without using bedrails?: None Help needed moving from lying on your back to sitting on the side of a flat bed without using bedrails?: A Little Help needed moving to and from a bed to a chair (including a wheelchair)?: A Lot Help needed standing up from a chair using your arms (e.g., wheelchair or bedside chair)?: A Little Help needed to walk in hospital room?: A Lot Help needed climbing 3-5 steps with a railing? : A Lot 6 Click Score: 16    End of Session   Activity Tolerance: Patient tolerated treatment well Patient left: with call bell/phone within reach;in bed;with bed alarm set Nurse Communication: Mobility status PT Visit Diagnosis: Unsteadiness on feet (R26.81);Other abnormalities of gait and mobility (R26.89);Difficulty in walking, not elsewhere classified (R26.2);Muscle weakness (generalized) (M62.81);Other symptoms and signs involving the nervous system (R29.898)     Time: 2023-3435 PT Time Calculation (min) (ACUTE ONLY): 44 min  Charges:  $Gait Training:  8-22 mins $Therapeutic Activity: 8-22 mins $Neuromuscular Re-education: 8-22 mins                     Moishe Spice, PT, DPT Acute Rehabilitation Services  Pager: [phone] Office: [place] 06/24/2021, 6:54 PM

## 2021-06-24 NOTE — Progress Notes (Addendum)
ANTICOAGULATION CONSULT NOTE ? ?Pharmacy Consult for warfarin ?Indication:  LVAD ? ?Allergies  ?Allergen Reactions  ? Plavix [Clopidogrel Bisulfate] Hives  ? ? ?Patient Measurements: ?Height: 5\' 5"  (165.1 cm) ?Weight: 77.7 kg (171 lb 4.8 oz) ?IBW/kg (Calculated) : 61.5 ? ?Vital Signs: ?Temp: 98.8 ?F (37.1 ?C) (03/02 1113) ?Temp Source: Oral (03/02 0416) ?BP: 78/61 (03/02 1113) ?Pulse Rate: 81 (03/02 1113) ? ?Labs: ?Recent Labs  ?  06/22/21 ?0013 06/23/21 ?9741 06/24/21 ?0100  ?HGB 12.0* 12.5* 12.6*  ?HCT 38.8* 39.0 39.8  ?PLT 202 199 200  ?LABPROT 13.7 14.7 14.9  ?INR 1.1 1.2 1.2  ?CREATININE 1.36* 1.37* 1.35*  ? ? ? ?Estimated Creatinine Clearance: 58.1 mL/min (A) (by C-G formula based on SCr of 1.35 mg/dL (H)). ? ? ?Medical History: ?Past Medical History:  ?Diagnosis Date  ? AICD (automatic cardioverter/defibrillator) present   ? Anemia   ? Anxiety   ? CAD (coronary artery disease) 2009  ? AMI in 12/2007 with PCI to LAD, staged PCI to  mid/distal RCA, NSTEMI in 02/2009 with BMS to LCx  ? CHF (congestive heart failure) ([place])   ? Chronic kidney disease 11/03/2016  ? stage 3 kidney disease  ? Dysrhythmia   ? "arrythmia problems at some point", "fatal rhythms"  ? History of blood transfusion   ? "I was bleeding from was rectum" (04/19/2017)  ? HLD (hyperlipidemia)   ? HTN (hypertension)   ? Hyperlipidemia 03/15/2011  ? Ischemic cardiomyopathy   ? Admitted in 07/2010 with CHF exacerbation  ? Myocardial infarction [facility])   ? "I've had 4" (04/19/2017)  ? Pneumonia 2018 X 1  ? Seizures ([place])   ? one seizure in 04/2016 during cardiac event (04/19/2017)  ? STEMI 2009 (anterior), 2010 (lateral), and 2012 (inferior) 03/14/2011  ? ? ?Assessment: ?14 yoM on warfarin PTA for hx HM3 LVAD. Pt admitted 2/11 with ICH requiring ventriculostomy. INR 2.4 on admit reversed with vitamin K and 4FPCC. Ventriculostomy drain removed 2/25. ? ?Warfarin ordered to resume 2/27 but not given until 3/1, head CT stable. INR 1.2 as expected after first  dose of 1 mg last night. CBC and LDH stable. ? ?*Home warfarin dose 4mg  Fri, 2mg  AODs ? ?Goal of Therapy:  ?INR 1.5-2.0 (given ICH on warfarin within therapeutic range) ?Monitor platelets by anticoagulation protocol: Yes ?  ?Plan:  ?Warfarin 1mg  PO x1 tonight ?Daily INR and CBC ? ?Cathrine Muster, PharmD ?PGY2 Cardiology Pharmacy Resident ?Phone: [phone] ?06/24/2021  12:31 PM ? ?Please check [email or web address] for unit-specific pharmacy phone numbers.  ? ? ? ?

## 2021-06-24 NOTE — Progress Notes (Addendum)
Patient ID: STONEWALL DOSS, male   DOB: 1966-01-14, 56 y.o.   MRN: [id number]   Advanced Heart Failure VAD Team Note  PCP-Cardiologist: Glori Bickers, MD   Subjective:    2/11 Presented with [place] 2/11 Underwent bedside ventriculostomy wit NSU 2/12 Repeat head CT with resolution of hydrocephalus. Persistent hematoma 2/17 Repeat CT head with increased hemorrhage in right lateral ventricle, slightly increased ventricular dilation.  The ventriculostomy drain is occluded.  2/18 Ventriculostomy drain replaced 2/25 CT improved ventriculomegaly. Drain pulled 2/27 F/u CT w/ stable intraparenchymal hemorrhage. Unchanged from prior.     Coumadin restarted 03/01 per Pharmacy. INR 1.2 today  Foley replaced yesterday d/t urinary retention.   Working with PT. Ambulated 80 feet yesterday. Some balance trouble and forgetfulness noted during session.  MAPs 80s.  No complaints this am. Sleeping but easily aroused. Answering questions appropriately.   LVAD INTERROGATION:  HeartMate 3 LVAD:   Flow 4.7  liters/min, speed 5400, power 4.0 PI 3.6. No PI events. VAD interrogated personally. Parameters stable.   Objective:    Vital Signs:   Temp:  [98 F (36.7 C)-99.4 F (37.4 C)] 98.9 F (37.2 C) (03/02 0416) Pulse Rate:  [61-134] 134 (03/02 0416) Resp:  [12-14] 14 (03/02 0416) BP: (86-103)/(67-91) 94/76 (03/02 0416) SpO2:  [92 %-98 %] 98 % (03/02 0416) Weight:  [77.7 kg] 77.7 kg (03/02 0430) Last BM Date : 06/20/21 Mean arterial Pressure  80s Intake/Output:   Intake/Output Summary (Last 24 hours) at 06/24/2021 0707 Last data filed at 06/24/2021 0430 Gross per 24 hour  Intake 240 ml  Output 425 ml  Net -185 ml     Physical Exam   Physical Exam: GENERAL: No distress, lying comfortably in bed. HEENT: normal  NECK: Supple, No JVD.  2+ bilaterally, no bruits.  CARDIAC:  Mechanical heart sounds with LVAD hum present.  LUNGS:  Clear to auscultation bilaterally.  ABDOMEN:  Soft, round,  nontender, positive bowel sounds x4.     LVAD exit site: well-healed and incorporated.  Dressing dry and intact.  No erythema or drainage.  Stabilization device present and accurately applied.  Driveline dressing is being changed daily per sterile technique. EXTREMITIES:  Warm and dry, no cyanosis, clubbing, rash or edema  NEUROLOGIC:  Alert and oriented x 4.  Gait steady.  No aphasia.  No dysarthria.     Telemetry   SR 60s  Labs   Basic Metabolic Panel: Recent Labs  Lab 06/19/21 0138 06/20/21 0336 06/21/21 0440 06/22/21 0013 06/23/21 0046 06/24/21 0100  NA 130* 132* 135 134* 135 133*  K 4.0 4.6 4.5 4.6 4.1 4.3  CL 96* 100 102 101 101 100  CO2 25 25 27 26 26 23   GLUCOSE 113* 106* 93 89 90 84  BUN 11 12 11 11 11 11   CREATININE 1.35* 1.24 1.40* 1.36* 1.37* 1.35*  CALCIUM 8.7* 8.8* 8.8* 8.5* 8.7* 8.5*  MG 1.7  --   --  1.7 1.8  --     Liver Function Tests: No results for input(s): AST, ALT, ALKPHOS, BILITOT, PROT, ALBUMIN in the last 168 hours.   No results for input(s): LIPASE, AMYLASE in the last 168 hours. No results for input(s): AMMONIA in the last 168 hours.   CBC: Recent Labs  Lab 06/20/21 0336 06/21/21 0440 06/22/21 0013 06/23/21 0046 06/24/21 0100  WBC 14.8* 11.5* 11.3* 8.5 10.2  HGB 13.1 13.2 12.0* 12.5* 12.6*  HCT 41.3 41.2 38.8* 39.0 39.8  MCV 82.9 83.6 85.1 84.1 84.7  PLT 217 200 202 199 200    INR: Recent Labs  Lab 06/20/21 0336 06/21/21 0440 06/22/21 0013 06/23/21 0046 06/24/21 0100  INR 1.1 1.0 1.1 1.2 1.2    Other results:    Imaging   No results found.   Medications:     Scheduled Medications:  amiodarone  200 mg Oral Daily   atorvastatin  40 mg Oral Daily   Chlorhexidine Gluconate Cloth  6 each Topical Daily   docusate sodium  200 mg Oral Daily   gabapentin  300 mg Oral BID   losartan  25 mg Oral Daily   magnesium oxide  400 mg Oral BID   mouth rinse  15 mL Mouth Rinse BID   pantoprazole  40 mg Oral Daily    polyethylene glycol  17 g Oral Daily   senna  2 tablet Oral QHS   tamsulosin  0.4 mg Oral Daily   traZODone  50 mg Oral QHS   Warfarin - Pharmacist Dosing Inpatient   Does not apply q1600    Infusions:  sodium chloride Stopped (06/19/21 1210)    PRN Medications: sodium chloride, acetaminophen, butalbital-acetaminophen-caffeine, hydrALAZINE, ipratropium-albuterol, ondansetron (ZOFRAN) IV, oxyCODONE, polyethylene glycol, traMADol    Assessment/Plan:    1. Intracerebral hemorrhage - CT brain with intracerebral and intraventricular hemorrhage with obstructive hydrocephalus. INR 2.4 - AC reversed with Kcentra/vit k in ER - Underwent bedside ventriculostomy on 2/11 - Initial ventriculostomy drain occluded. CT head 2/17 with increased hemorrhage in right lateral ventricle, slightly increased ventricular dilation.  On 2/18, ventriculostomy drain replaced.  - CT 2/25 improved ventriculomegaly. Drain pulled 2/25 - CT 2/27 stable intraparenchymal hemorrhage in the frontal lobe and basal ganglia on the right. Midline shift is slightly reduced from the prior exam. - Neuro Surgery appreciated. Continues to improve gradually.  - monitor w/ restart of coumadin    2. Chronic systolic CHF with HM III LVAD implant 04/20/17:  - Has done well with VAD support. - He has been seen at [facility] for transplant referral but has decided not to pursue transplant. No change - New oxygen requirement with pulmonary edema on 2/17 CXR => good diuresis on 2/18.  - Volume status stable.  - 2/27 and 2/28 Coumadin ordered not but given. Started 03/01. INR 1.2 this am. Dosing per Pharmacy. - LDH stable.   3. Paroxysmal AFL with RVR - had DC-CV on 10/17/18 with subsequent short episodes of AF after but has been mostly in NSR on low-dose amio (100 daily) - Had recurrent AF in 10/22.  - maintaining SR.  - Continue amio 200 mg daily - Continue coumadin.    4. h/o UGI AVM bleed - s/p APC x 4 lesions in 2/19 - No  recurrent bleeding  - Continue PPI - Off ASA due to GIB.  - Hgb stable at 12.6   5. AKI on CKD Stage IIIa:  - Creatinine baseline 1.3 - 1.7  - Creatinine stable at 1.35  6. VAD management - VAD interrogated personally. Parameters stable. - LDH has been stable 158 today - INR goal 2.0-2.5 Off ASA due to GIB 2/19.  - INR 2.4 reversed due to [place]. INR 1.2.  - Continue coumadin. - MAPs 80s - DL site looks good   6. HTN - goal MAP 70-90 with ICH  - losartan increased 2/14 with resultant hypotension and AKI. Hydralazine stopped.  - Losartan decreased to 25 mg daily on 02/18 d/t bump in Scr.  -Stable today.   7. CAD:   -  s/p PCI RCA/PLOM with DES x2 and DES to mild LAD 1/18 - No chest pain.  - Continue statin. Off ASA due to GIB   8. Hypomagnesemia. - Mg 1.8 on 03/01. Supped with 2 g Mag IV. - Recheck today.  9. Hyponatremia - Received tolvaptan 2/18, Na 133 today - Follow daily.   10. Urinary retention - continue Flomax  - Foley replaced 03/01  Needs aggressive PT/OT. Ambulate today. Discussed with RN.  Not candidate for CIR. Plan HH PT.     Length of Stay: [address] LINDSAY N, PA-C 06/24/2021, 7:07 AM  VAD Team --- VAD ISSUES ONLY--- Pager [phone] (7am - 7am)  Advanced Heart Failure Team  Pager [phone] (M-F; 7a - 5p)  Please contact [facility] for night-coverage after hours (5p -7a ) and weekends on [email or web address]   Patient seen and examined with the above-signed Advanced Practice Provider and/or Housestaff. I personally reviewed laboratory data, imaging studies and relevant notes. I independently examined the patient and formulated the important aspects of the plan. I have edited the note to reflect any of my changes or salient points. I have personally discussed the plan with the patient and/or family.  Lethargic this am but will arouse and have a conversation then fall back to sleep. Continues with 5/10 HA. No other neuro symptoms. Started warfarin last  night. Foley replaced due to urinary retention.   General:  NAD.  HEENT: normal  Neck: supple. JVP not elevated.  Carotids 2+ bilat; no bruits. No lymphadenopathy or thryomegaly appreciated. Cor: LVAD hum.  Lungs: Clear. Abdomen: obese soft, nontender, non-distended. No hepatosplenomegaly. No bruits or masses. Good bowel sounds. Driveline site clean. Anchor in place.  Extremities: no cyanosis, clubbing, rash. Warm no edema  Neuro: lethargic but arousable oriented x 3. No focal deficits. Moves all 4 without problem   Remains tenuous. Will try to cut back narcotics to see if this improves alertness (and urinary retention). Watch closely for infection. If po intake remains poor may need some IVF. Continue warfarin. VAD interrogated personally. Parameters stable.  Continue PT/OT.   Glori Bickers, MD  3:54 PM

## 2021-06-25 LAB — CBC
HCT: 37.9 % — ABNORMAL LOW (ref 39.0–52.0)
Hemoglobin: 12.3 g/dL — ABNORMAL LOW (ref 13.0–17.0)
MCH: 27.2 pg (ref 26.0–34.0)
MCHC: 32.5 g/dL (ref 30.0–36.0)
MCV: 83.7 fL (ref 80.0–100.0)
Platelets: 197 10*3/uL (ref 150–400)
RBC: 4.53 MIL/uL (ref 4.22–5.81)
RDW: 14.4 % (ref 11.5–15.5)
WBC: 8.6 10*3/uL (ref 4.0–10.5)
nRBC: 0 % (ref 0.0–0.2)

## 2021-06-25 LAB — BASIC METABOLIC PANEL
Anion gap: 7 (ref 5–15)
BUN: 14 mg/dL (ref 6–20)
CO2: 25 mmol/L (ref 22–32)
Calcium: 8.5 mg/dL — ABNORMAL LOW (ref 8.9–10.3)
Chloride: 101 mmol/L (ref 98–111)
Creatinine, Ser: 1.57 mg/dL — ABNORMAL HIGH (ref 0.61–1.24)
GFR, Estimated: 51 mL/min — ABNORMAL LOW (ref >60)
Glucose, Bld: 85 mg/dL (ref 70–99)
Potassium: 4.2 mmol/L (ref 3.5–5.1)
Sodium: 133 mmol/L — ABNORMAL LOW (ref 135–145)

## 2021-06-25 LAB — MAGNESIUM: Magnesium: 1.7 mg/dL (ref 1.7–2.4)

## 2021-06-25 LAB — PROTIME-INR
INR: 1 (ref 0.8–1.2)
Prothrombin Time: 13.6 seconds (ref 11.4–15.2)

## 2021-06-25 LAB — LACTATE DEHYDROGENASE: LDH: 165 U/L (ref 98–192)

## 2021-06-25 MED ORDER — EYE WASH OPHTH SOLN: 1.0000 [drp] | OPHTHALMIC | Status: DC | PRN

## 2021-06-25 MED ORDER — POLYVINYL ALCOHOL 1.4 % OP SOLN
2.0000 [drp] | OPHTHALMIC | Status: DC | PRN
  Filled 2021-06-25: qty 15

## 2021-06-25 MED ORDER — MAGNESIUM SULFATE 2 GM/50ML IV SOLN
2.0000 g | Freq: Once | INTRAVENOUS | Status: AC
  Administered 2021-06-25: 2 g via INTRAVENOUS
  Filled 2021-06-25: qty 50

## 2021-06-25 MED ORDER — WARFARIN SODIUM 2 MG PO TABS
2.0000 mg | ORAL_TABLET | Freq: Once | ORAL | Status: AC
  Administered 2021-06-25: 2 mg via ORAL
  Filled 2021-06-25: qty 1

## 2021-06-25 NOTE — Progress Notes (Signed)
ANTICOAGULATION CONSULT NOTE ? ?Pharmacy Consult for warfarin ?Indication:  LVAD ? ?Allergies  ?Allergen Reactions  ? Plavix [Clopidogrel Bisulfate] Hives  ? ? ?Patient Measurements: ?Height: 5\' 5"  (165.1 cm) ?Weight: 78 kg (171 lb 15.3 oz) ?IBW/kg (Calculated) : 61.5 ? ?Vital Signs: ?Temp: 99.2 ?F (37.3 ?C) (03/03 0425) ?Temp Source: Oral (03/03 0425) ?BP: 87/75 (03/03 0425) ?Pulse Rate: 75 (03/03 0425) ? ?Labs: ?Recent Labs  ?  06/23/21 ?9518 06/24/21 ?0100 06/25/21 ?0107  ?HGB 12.5* 12.6* 12.3*  ?HCT 39.0 39.8 37.9*  ?PLT 199 200 197  ?LABPROT 14.7 14.9 13.6  ?INR 1.2 1.2 1.0  ?CREATININE 1.37* 1.35* 1.57*  ? ? ? ?Estimated Creatinine Clearance: 50 mL/min (A) (by C-G formula based on SCr of 1.57 mg/dL (H)). ? ? ?Medical History: ?Past Medical History:  ?Diagnosis Date  ? AICD (automatic cardioverter/defibrillator) present   ? Anemia   ? Anxiety   ? CAD (coronary artery disease) 2009  ? AMI in 12/2007 with PCI to LAD, staged PCI to  mid/distal RCA, NSTEMI in 02/2009 with BMS to LCx  ? CHF (congestive heart failure) ([place])   ? Chronic kidney disease 11/03/2016  ? stage 3 kidney disease  ? Dysrhythmia   ? "arrythmia problems at some point", "fatal rhythms"  ? History of blood transfusion   ? "I was bleeding from was rectum" (04/19/2017)  ? HLD (hyperlipidemia)   ? HTN (hypertension)   ? Hyperlipidemia 03/15/2011  ? Ischemic cardiomyopathy   ? Admitted in 07/2010 with CHF exacerbation  ? Myocardial infarction [facility])   ? "I've had 4" (04/19/2017)  ? Pneumonia 2018 X 1  ? Seizures ([place])   ? one seizure in 04/2016 during cardiac event (04/19/2017)  ? STEMI 2009 (anterior), 2010 (lateral), and 2012 (inferior) 03/14/2011  ? ? ?Assessment: ?12 yoM on warfarin PTA for hx HM3 LVAD. Pt admitted 2/11 with ICH requiring ventriculostomy. INR 2.4 on admit reversed with vitamin K and 4FPCC. Ventriculostomy drain removed 2/25. ? ?Warfarin ordered to resume 2/27 but not given until 3/1, head CT stable. INR 1.0 as expected after 2 doses  dose of 1 mg. CBC and LDH stable. Will give slightly higher dose tonight.  ? ?*Home warfarin dose 4mg  Fri, 2mg  AODs ? ?Goal of Therapy:  ?INR 1.5-2.0 (given ICH on warfarin within therapeutic range) ?Monitor platelets by anticoagulation protocol: Yes ?  ?Plan:  ?Warfarin 2 mg PO x1 tonight ?Daily INR and CBC ? ?Cathrine Muster, PharmD ?PGY2 Cardiology Pharmacy Resident ?Phone: [phone] ?06/25/2021  11:19 AM ? ?Please check [email or web address] for unit-specific pharmacy phone numbers.  ? ? ? ?

## 2021-06-25 NOTE — Progress Notes (Addendum)
Patient ID: David [name], male   DOB: 07/11/1965, 56 y.o.   MRN: [id number]   Advanced Heart Failure VAD Team Note  PCP-Cardiologist: Glori Bickers, MD   Subjective:    2/11 Presented with [place] 2/11 Underwent bedside ventriculostomy wit NSU 2/12 Repeat head CT with resolution of hydrocephalus. Persistent hematoma 2/17 Repeat CT head with increased hemorrhage in right lateral ventricle, slightly increased ventricular dilation.  The ventriculostomy drain is occluded.  2/18 Ventriculostomy drain replaced 2/25 CT improved ventriculomegaly. Drain pulled 2/27 F/u CT w/ stable intraparenchymal hemorrhage. Unchanged from prior.     Coumadin restarted 03/01 per Pharmacy. INR 1.0 today.   Foley remains for urinary retention  MAPs 80s-low 90s  Weight stable. Not on diuretics. Scr 1.36>1.35>1.57  Showing more functional decline in PT sessions. PT now recommending AIR. Narcotics have been cut back.  Lying in bed watching TV. Still with headache but improves with pain medication. Appetite not great. No dyspnea.   LVAD INTERROGATION:  HeartMate 3 LVAD:   Flow 4.9  liters/min, speed 5400, power 4 PI 3.4. 1 PI event. VAD interrogated personally. Parameters stable.   Objective:    Vital Signs:   Temp:  [98.7 F (37.1 C)-99.5 F (37.5 C)] 99.2 F (37.3 C) (03/03 0425) Pulse Rate:  [70-95] 75 (03/03 0425) Resp:  [14-18] 17 (03/03 0425) BP: (78-120)/(53-94) 87/75 (03/03 0425) SpO2:  [90 %-95 %] 95 % (03/03 0425) Weight:  [78 kg] 78 kg (03/03 0437) Last BM Date : 06/20/21 Mean arterial Pressure  80s-low90s Intake/Output:   Intake/Output Summary (Last 24 hours) at 06/25/2021 0701 Last data filed at 06/25/2021 0427 Gross per 24 hour  Intake 0 ml  Output 525 ml  Net -525 ml     Physical Exam   Physical Exam: GENERAL: Lying in bed. No distress. HEENT: normal  NECK: Supple, No JVD.  2+ bilaterally, no bruits.   CARDIAC:  Mechanical heart sounds with LVAD hum present.  LUNGS:   Clear to auscultation bilaterally.  ABDOMEN:  Soft, round, nontender, positive bowel sounds x4.     LVAD exit site: well-healed and incorporated.  Dressing dry and intact.  No erythema or drainage.  Stabilization device present and accurately applied.  Driveline dressing is being changed daily per sterile technique. EXTREMITIES:  Warm and dry, no cyanosis, clubbing, rash or edema  NEUROLOGIC:  Alert and oriented x 4.  Gait steady.  No aphasia.  No dysarthria.  Affect flat.    Telemetry   SR 60s  Labs   Basic Metabolic Panel: Recent Labs  Lab 06/19/21 0138 06/20/21 0336 06/21/21 0440 06/22/21 0013 06/23/21 0046 06/24/21 0100 06/25/21 0107  NA 130*   < > 135 134* 135 133* 133*  K 4.0   < > 4.5 4.6 4.1 4.3 4.2  CL 96*   < > 102 101 101 100 101  CO2 25   < > 27 26 26 23 25   GLUCOSE 113*   < > 93 89 90 84 85  BUN 11   < > 11 11 11 11 14   CREATININE 1.35*   < > 1.40* 1.36* 1.37* 1.35* 1.57*  CALCIUM 8.7*   < > 8.8* 8.5* 8.7* 8.5* 8.5*  MG 1.7  --   --  1.7 1.8  --  1.7   < > = values in this interval not displayed.    Liver Function Tests: No results for input(s): AST, ALT, ALKPHOS, BILITOT, PROT, ALBUMIN in the last 168 hours.   No results  for input(s): LIPASE, AMYLASE in the last 168 hours. No results for input(s): AMMONIA in the last 168 hours.   CBC: Recent Labs  Lab 06/21/21 0440 06/22/21 0013 06/23/21 0046 06/24/21 0100 06/25/21 0107  WBC 11.5* 11.3* 8.5 10.2 8.6  HGB 13.2 12.0* 12.5* 12.6* 12.3*  HCT 41.2 38.8* 39.0 39.8 37.9*  MCV 83.6 85.1 84.1 84.7 83.7  PLT 200 202 199 200 197    INR: Recent Labs  Lab 06/21/21 0440 06/22/21 0013 06/23/21 0046 06/24/21 0100 06/25/21 0107  INR 1.0 1.1 1.2 1.2 1.0    Other results:    Imaging   No results found.   Medications:     Scheduled Medications:  amiodarone  200 mg Oral Daily   atorvastatin  40 mg Oral Daily   Chlorhexidine Gluconate Cloth  6 each Topical Daily   docusate sodium  200 mg  Oral Daily   gabapentin  300 mg Oral BID   losartan  25 mg Oral Daily   magnesium oxide  400 mg Oral BID   mouth rinse  15 mL Mouth Rinse BID   pantoprazole  40 mg Oral Daily   polyethylene glycol  17 g Oral Daily   senna  2 tablet Oral QHS   tamsulosin  0.4 mg Oral Daily   traZODone  50 mg Oral QHS   Warfarin - Pharmacist Dosing Inpatient   Does not apply q1600    Infusions:  sodium chloride Stopped (06/19/21 1210)    PRN Medications: sodium chloride, acetaminophen, hydrALAZINE, ipratropium-albuterol, ondansetron (ZOFRAN) IV, oxyCODONE, polyethylene glycol, traMADol    Assessment/Plan:    1. Intracerebral hemorrhage - CT brain with intracerebral and intraventricular hemorrhage with obstructive hydrocephalus. INR 2.4 - AC reversed with Kcentra/vit k in ER - Underwent bedside ventriculostomy on 2/11 - Initial ventriculostomy drain occluded. CT head 2/17 with increased hemorrhage in right lateral ventricle, slightly increased ventricular dilation.  On 2/18, ventriculostomy drain replaced.  - CT 2/25 improved ventriculomegaly. Drain pulled 2/25 - CT 2/27 stable intraparenchymal hemorrhage in the frontal lobe and basal ganglia on the right. Midline shift is slightly reduced from the prior exam. - Neuro Surgery appreciated. Continues to improve gradually.  - monitor w/ resumption of coumadin   2. Chronic systolic CHF with HM III LVAD implant 04/20/17:  - Has done well with VAD support. - He has been [facility] for [facility] but has decided not to pursue transplant. No change - New oxygen requirement with pulmonary edema on 2/17 CXR => good diuresis on 2/18.  - Volume status stable. May need IV fluids if PO intake not improving. - 2/27 and 2/28 Coumadin ordered not but given. Started 03/01. INR 1.0 this am. Dosing per Pharmacy. - LDH stable.   3. Paroxysmal AFL with RVR - had DC-CV on 10/17/18 with subsequent short episodes of AF after but has been mostly in NSR on  low-dose amio (100 daily) - Had recurrent AF in 10/22.  - maintaining SR.  - Continue amio 200 mg daily - Continue coumadin.    4. h/o UGI AVM bleed - s/p APC x 4 lesions in 2/19 - No recurrent bleeding  - Continue PPI - Off ASA due to GIB.  - Hgb stable at 12.3   5. AKI on CKD Stage IIIa:  - Creatinine baseline 1.3 - 1.7  - Creatinine up slightly 1.35>1.57 - May need IV fluids if PO intake not improving.   6. VAD management - VAD interrogated personally. Parameters stable. -  LDH has been stable.  - INR goal 2.0-2.5 Off ASA due to GIB 2/19.  - INR 2.4 reversed due to [place]. INR 1.0.  - Continue coumadin. - MAPs 80s-low090s - DL site looks good   6. HTN - goal MAP 70-90 with ICH  - losartan increased 2/14 with resultant hypotension and AKI. Hydralazine stopped.  - Losartan decreased to 25 mg daily on 02/18 d/t bump in Scr.  - MAPs primarily 80s-low 90s. Continue to follow.  7. CAD:   - s/p PCI RCA/PLOM with DES x2 and DES to mild LAD 1/18 - No chest pain.  - Continue statin. Off ASA due to GIB   8. Hypomagnesemia. - Mg 1.7. Supp today.  9. Hyponatremia - Received tolvaptan 2/18, Na 133 today - Follow daily.   10. Urinary retention - continue Flomax  - Foley replaced 03/01   Initially not a candidate for CIR. Functional decline last 2 days. PT now recommending acute inpatient rehab. Will place consult for CIR to reassess.  Encouraged him to increase po intake.    Length of Stay: [address] Lynder Parents, PA-C 06/25/2021, 7:01 AM  VAD Team --- VAD ISSUES ONLY--- Pager [phone] (7am - 7am)  Advanced Heart Failure Team  Pager [phone] (M-F; 7a - 5p)  Please contact [facility] for night-coverage after hours (5p -7a ) and weekends on [email or web address]  Patient seen and examined with the above-signed Advanced Practice Provider and/or Housestaff. I personally reviewed laboratory data, imaging studies and relevant notes. I independently examined the patient and  formulated the important aspects of the plan. I have edited the note to reflect any of my changes or salient points. I have personally discussed the plan with the patient and/or family.  Very lethargic yesterday. Oxycodone cut back. Much more alert today. Denies CP or SOB. Still weak in LUE. Foley remains in place. Warfarin restarted. INR 1.0. Mg 1.7   General:  NAD.  HEENT: normal  Neck: supple. JVP not elevated.  Carotids 2+ bilat; no bruits. No lymphadenopathy or thryomegaly appreciated. Cor: LVAD hum.  Lungs: Clear. Abdomen: obese soft, nontender, non-distended. No hepatosplenomegaly. No bruits or masses. Good bowel sounds. Driveline site clean. Anchor in place.  Extremities: no cyanosis, clubbing, rash. Warm no edema  Neuro: alert & oriented x 3. No focal deficits. Weak in LUE  Much more alert today after cutting back narcotics. Would keep dosing to a minimum. Should also help with urinary voiding trials.   Continue to load warfarin. Supp mag  VAD interrogated personally. Parameters stable.  PT/OT working with him. Now qualifies for CIR. Potential transfer on Monday.   Glori Bickers, MD  4:24 PM

## 2021-06-25 NOTE — Progress Notes (Signed)
LVAD Coordinator Rounding Note: ? ?Admitted 06/05/21 due to [place] with hydrocephalus to Dr Bensimhon's service. Ventriculostomy placed at bedside by Dr Arnoldo Morale.  ? ?HM III LVAD implanted on 04/20/17 by Dr Cyndia Bent under destination therapy criteria. ? ?EVD (external ventricular drain) was replaced x 2 on 06/12/21 by Dr. Zada Finders (Neuro). 2/25 CT improved ventriculomegaly. Drain pulled. Repeat head CT 2/27:  ?1. Interval removal of right ventriculostomy catheter. ?2. Stable intraparenchymal hemorrhage in the frontal lobe and basal ganglia on the right. Midline shift is slightly reduced from the prior exam. ?3. Small scalp hematoma over the frontal bone on the right. ?4. Remaining findings are unchanged. ? ?Per Dr Arnoldo Morale (neurosurgery) he will sign off for now. Pt will need follow up with him in his office in a few weeks. Will scheduled appt at discharge.  ? ?Pt sitting up in recliner this morning. Reports he has a 5/10 constant headache. Recently received PRN Tramadol. Pt knew my name, and is alert to person and place this morning. At times responses are delayed.  ? ?Initially not a candidate for CIR. Functional decline last 2 days. PT now recommending acute inpatient rehab. Consult placed for CIR. Possible admit on Monday pending insurance authorization.  ? ?Encouraged continued participation with PT/OT despite headache. He verbalized understanding. Encouraged increased PO intake. He verbalized understanding.  ? ?Foley replaced due to difficulty voiding.  ? ?Received Coumadin last night. Will closely monitor for neuro changes with anticoagulation restart.  ? ?Vital signs: ?Temp: 98.7 ?HR: 70 ?Doppler Pressure: 92 ?Automatic BP: 97/58 (85) ?O2 Sat: 94% on RA ?Wt:182.3>186>186.5>189.6>190.2>197.5>187.6>172>189.6>187.6>185.6>191>176.1>172.6>171.3>171.9 lbs   ? ?LVAD interrogation reveals:  ?Speed: 5400 ?Flow: 4.7 ?Power:  4.1w ?PI: 3.8 ?Hematocrit: 39 ? ?Alarms: none ?Events: none ? ?Fixed speed: 5400 ?Low speed limit:  5100 ? ?Drive Line: Dressing C/D/I with anchor intact and accurately applied.  Weekly dressing changes per bedside RN or pt's wife. Next dressing change due: 06/28/21. ? ?Labs:  ?LDH trend: 166>176>157>162>165>190>205>176>178>174>173>171>174>191>158>192>165 ? ?INR trend: 1.0>1.2>1.1>1.2>1.1>1.2>1.1>1.0>1.1>1.0>1.1>1.2>1.2>1.0 ? ? ?Anticoagulation Plan: ?-INR Goal: 2 - 2.5  (anticoagulation on hold currently due to [place]) ?-ASA Dose: none due to hx of GI bleed ? ?Device: ?- [employer] BiV ?-Therapies: on- VF 200, VT 167 ? AT 171 monitored ?- Last check: 04/29/21 ? ?Adverse Events on VAD: ?- Readmitted 2/19 hgb 3.8. INR 2.9.transfused 4u pRBCs. EGD - normal esophagus, 4 nonbleeding gastric AVMs treated with APC, normal duodenum and jejunum ?- Underwent DC-CV for AFL on 10/17/18  ?- Admitted 06/05/21 for [place] with ventriculostomy placement.  ? ?Plan/Recommendations:  ?1. Call VAD coordinator for any VAD equipment or drive line issues ?2. Weekly drive line dressing change per bedside RN or pt's wife. Next dressing change due 06/28/21. ? ? ?Emerson Monte RN ?VAD Coordinator  ?Office: [phone]  ?24/7 Pager: [phone]  ? ? ?  ?

## 2021-06-25 NOTE — PMR Pre-admission (Signed)
PMR Admission Coordinator Pre-Admission Assessment  Patient: David [name] is an 56 y.o., male MRN: [id number] DOB: 12-18-1965 Height: 5\' 5"  (165.1 cm) Weight: 76.5 kg  Insurance Information HMO: yes    PPO:      PCP:      IPA:      80/20:      OTHER:  PRIMARY: [employer]      Policy#: [id number]      Subscriber: pt CM Name: Rubin Payor      Phone#: [phone] ext [phone]     Fax#: [phone] Pre-Cert#: [id number]   approved for 7 days with f/u with Fraser Din at ext [phone]   Employer:  Benefits:  Phone #: [phone]     Name: 3/3 Eff. Date: 04/25/2021     Deduct: none      Out of Pocket Max: $3400      Life Max: none CIR: $295 co pay per day days 1 until 6      SNF: no copay days 1 until 20; $196 co apy per day days 21 until 100 Outpatient: $10 to $20 per visit     Co-Pay: visits per medical neccesity Home Health: 100%      Co-Pay: visits per medical neccesity DME: 80%     Co-Pay: 20% Providers: in network  SECONDARY: none        Financial Counselor:       Phone#:   The [employer] for patients in Inpatient Rehabilitation Facilities with attached Privacy Act [place] Records was provided and verbally reviewed with: Patient and Family  Emergency Contact Information Contact Information     Name Relation Home Work Mobile   [facility] Spouse [phone]  [phone]   Romona Curls Daughter   [phone]      Current Medical History  Patient Admitting Diagnosis: ICH, LVAD  History of Present Illness: 56 year old male with history of ICD, CAD< EF <20% for > 5 years, HLD and HTN. 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 Presented on 06/16/21 with worsening headache. CT showed intracerebral and intraventricular hemorrhage with obstructive hydrocephalus. INR 2.4.  Neurosurgery consulted and underwent bedside ventriculostomy. Repeat CT with some increased hemorrhage when ventric occluded therefore replaced. Eventually on 06/19/21 CT showed improved  ventriculomegaly and drain discontinued. Coumadin restarted on 06/23/21 with pharmacy assisting with Coumadin management.   Chronic systolic CHF with HM II LVAD implant previously. New oxygen requirement with pulmonary edema and with gentle diuresis. Paroxysmal AFL with RVR. On low dose amio. Baseline creat 1.3- 1.7. will need to monitor if po not maintained and replace with IV fluids as needed. Losartan decreased due to bump in creat. Maps primarily 80s - low 90s.Continue statin .Off ASA due to history of GIB. Urinary retention and placed on Flomax. Foley placed on 06/23/21.  Head CT on 3/4 no change from prior. Patient continues to complain of headache. Coumadin began 3/1. INR goal 1.5 - 2.   Complete NIHSS TOTAL: 2  Patient's medical record from [facility]  has been reviewed by the rehabilitation admission coordinator and physician.  Past Medical History  Past Medical History:  Diagnosis Date   AICD (automatic cardioverter/defibrillator) present    Anemia    Anxiety    CAD (coronary artery disease) 2009   AMI in 12/2007 with PCI to LAD, staged PCI to  mid/distal RCA, NSTEMI in 02/2009 with BMS to LCx   CHF (congestive heart failure) ([place])    Chronic kidney disease 11/03/2016   stage 3 kidney disease  Dysrhythmia    "arrythmia problems at some point", "fatal rhythms"   History of blood transfusion    "I was bleeding from was rectum" (04/19/2017)   HLD (hyperlipidemia)    HTN (hypertension)    Hyperlipidemia 03/15/2011   Ischemic cardiomyopathy    Admitted in 07/2010 with CHF exacerbation   Myocardial infarction ([place])    "I've had 4" (04/19/2017)   Pneumonia 2018 X 1   Seizures ([place])    one seizure in 04/2016 during cardiac event (04/19/2017)   STEMI 2009 (anterior), 2010 (lateral), and 2012 (inferior) 03/14/2011   Has the patient had major surgery during 100 days prior to admission? No  Family History   family history includes Coronary artery disease in his brother,  father, mother, and another family member; Diabetes in an other family member; Emphysema in his brother and mother; Hypertension in his daughter, father, mother, sister, and another family member; Obesity in his daughter.  Current Medications  Current Facility-Administered Medications:    0.9 %  sodium chloride infusion, , Intravenous, PRN, Clegg, Amy D, NP, Stopped at 06/19/21 1210   acetaminophen (TYLENOL) tablet 650 mg, 650 mg, Oral, Q4H PRN, Clegg, Amy D, NP, 650 mg at 06/27/21 2234   amiodarone (PACERONE) tablet 200 mg, 200 mg, Oral, Daily, Clegg, Amy D, NP, 200 mg at 06/28/21 0934   atorvastatin (LIPITOR) tablet 40 mg, 40 mg, Oral, Daily, Clegg, Amy D, NP, 40 mg at 06/28/21 0934   Chlorhexidine Gluconate Cloth 2 % PADS 6 each, 6 each, Topical, Daily, Bensimhon, Shaune Pascal, MD, 6 each at 06/27/21 1000   docusate sodium (COLACE) capsule 200 mg, 200 mg, Oral, Daily, Clegg, Amy D, NP, 200 mg at 06/28/21 0934   gabapentin (NEURONTIN) capsule 300 mg, 300 mg, Oral, BID, Clegg, Amy D, NP, 300 mg at 06/28/21 0934   hydrALAZINE (APRESOLINE) injection 10 mg, 10 mg, Intravenous, Q2H PRN, Clegg, Amy D, NP, 10 mg at 06/27/21 2359   ipratropium-albuterol (DUONEB) 0.5-2.5 (3) MG/3ML nebulizer solution 3 mL, 3 mL, Nebulization, Q4H PRN, Clegg, Amy D, NP   losartan (COZAAR) tablet 25 mg, 25 mg, Oral, Daily, Clegg, Amy D, NP, 25 mg at 06/28/21 0933   magnesium oxide (MAG-OX) tablet 400 mg, 400 mg, Oral, BID, Clegg, Amy D, NP, 400 mg at 06/28/21 0934   MEDLINE mouth rinse, 15 mL, Mouth Rinse, BID, Clegg, Amy D, NP, 15 mL at 06/28/21 0934   ondansetron (ZOFRAN) injection 4 mg, 4 mg, Intravenous, Q6H PRN, Clegg, Amy D, NP, 4 mg at 06/19/21 0534   oxyCODONE (Oxy IR/ROXICODONE) immediate release tablet 5-10 mg, 5-10 mg, Oral, Q12H PRN, Bensimhon, Shaune Pascal, MD, 5 mg at 06/27/21 2026   pantoprazole (PROTONIX) EC tablet 40 mg, 40 mg, Oral, Daily, Clegg, Amy D, NP, 40 mg at 06/28/21 0934   polyethylene glycol (MIRALAX /  GLYCOLAX) packet 17 g, 17 g, Oral, Daily PRN, Clegg, Amy D, NP, 17 g at 06/08/21 1522   polyethylene glycol (MIRALAX / GLYCOLAX) packet 17 g, 17 g, Oral, Daily, Clegg, Amy D, NP, 17 g at 06/28/21 0933   polyvinyl alcohol (LIQUIFILM TEARS) 1.4 % ophthalmic solution 2 drop, 2 drop, Both Eyes, PRN, Bensimhon, Shaune Pascal, MD   senna (SENOKOT) tablet 17.2 mg, 2 tablet, Oral, QHS, Clegg, Amy D, NP, 17.2 mg at 06/27/21 2234   tamsulosin (FLOMAX) capsule 0.4 mg, 0.4 mg, Oral, Daily, Clegg, Amy D, NP, 0.4 mg at 06/28/21 0934   traMADol (ULTRAM) tablet 50 mg, 50 mg, Oral, Q12H  PRN, Bensimhon, Shaune Pascal, MD, 50 mg at 06/28/21 0934   traZODone (DESYREL) tablet 50 mg, 50 mg, Oral, QHS, Clegg, Amy D, NP, 50 mg at 06/27/21 2234   warfarin (COUMADIN) tablet 2 mg, 2 mg, Oral, ONCE-1600, Einar Grad, [facility]   Warfarin - Pharmacist Dosing Inpatient, , Does not apply, q1600, Clegg, Amy D, NP, Given at 06/27/21 2236  Patients Current Diet:  Diet Order             Diet Heart Room service appropriate? Yes; Fluid consistency: Thin; Fluid restriction: 1800 mL Fluid  Diet effective now                  Precautions / Restrictions Precautions Precautions: Fall, Other (comment) Precaution Comments: LVAD (x4 year hx Restrictions Weight Bearing Restrictions: No   Has the patient had 2 or more falls or a fall with injury in the past year? No  Prior Activity Level Limited Community (1-2x/wk): I without AD  Prior Functional Level Self Care: Did the patient need help bathing, dressing, using the toilet or eating? Independent  Indoor Mobility: Did the patient need assistance with walking from room to room (with or without device)? Independent  Stairs: Did the patient need assistance with internal or external stairs (with or without device)? Independent  Functional Cognition: Did the patient need help planning regular tasks such as shopping or remembering to take medications? Independent  Patient  Information Are you of Hispanic, Latino/a,or Spanish origin?: A. No, not of Hispanic, Latino/a, or Spanish origin What is your race?: B. Black or African American Do you need or want an interpreter to communicate with a doctor or health care staff?: 0. No  Patient's Response To:  Health Literacy and Transportation Is the patient able to respond to health literacy and transportation needs?: Yes Health Literacy - How often do you need to have someone help you when you read instructions, pamphlets, or other written material from your doctor or pharmacy?: Never In the past 12 months, has lack of transportation kept you from medical appointments or from getting medications?: No In the past 12 months, has lack of transportation kept you from meetings, work, or from getting things needed for daily living?: No  Development worker, international aid / [place] Devices/Equipment: None Home Equipment: None  Prior Device Use: Indicate devices/aids used by the patient prior to current illness, exacerbation or injury? None of the above  Current Functional Level Cognition  Overall Cognitive Status: Impaired/Different from baseline Current Attention Level: Sustained, Selective Orientation Level: Oriented X4 Following Commands: Follows one step commands with increased time, Follows multi-step commands inconsistently Safety/Judgement: Decreased awareness of safety, Decreased awareness of deficits General Comments: Pt oriented to month/year, place, but not day of week. Poor short term memory recall    Extremity Assessment (includes Sensation/Coordination)  Upper Extremity Assessment: Overall WFL for tasks assessed  Lower Extremity Assessment: Defer to PT evaluation    ADLs  Overall ADL's : Needs assistance/impaired Eating/Feeding: Set up, Sitting Grooming: Min guard, Moderate assistance, Oral care, Standing Grooming Details (indicate cue type and reason): Pt requiring increased time to note items  in L visual field. Min Guard A for safety at first. Pt then with lateral lean with fatigue - Mod A for support as pt leaning to left in standing Upper Body Bathing: Minimal assistance, Sitting Lower Body Bathing: Moderate assistance, Sit to/from stand Lower Body Bathing Details (indicate cue type and reason): While sitting at EOB, requesting pt perform peri care. pt  instantly reaches wash clothe towards his wife to clean him. Cueing pt to perform and requiring increased encouragment. Pt with difficulty terminating task and requiring cues to stop Upper Body Dressing : Moderate assistance, Sitting Upper Body Dressing Details (indicate cue type and reason): When switching from wall power to LVAD batteries, pt with poor problem solving, error recognition, sequencing requiring Mod-Max cues cues Lower Body Dressing: Minimal assistance, Sit to/from stand, Maximal assistance, Cueing for sequencing Lower Body Dressing Details (indicate cue type and reason): Pt doning underwear while seated at EOB requiring Max A over all due to cognition. Requiring Max cues to recognition error in his attempt and then problem solve a solution to compensatory for his decreased ROM. Also requiring cues for taking several rest breaks. Min A for sitting balance while bending forward. Min A to bring underwear over L heal as pt continued to attempt to pull underwear forward and not back behind heel despite cues. Toilet Transfer: Minimal assistance, Horticulturist, commercial Details (indicate cue type and reason): Min A for balance Tub/ Shower Transfer: Min guard, Ambulation Functional mobility during ADLs: Minimal assistance, Min guard, Moderate assistance General ADL Comments: Pt requiring Min Guard A for initial mobility but then as he fatigued, he required atleast Min A and at times Mod A. Leaning to L and shuffing gait. Pt switching from A/C power to batteries with Mod cues and Min hand over hand. Participating in two part  instruction task and LUE exercises at sink.    Mobility  Overal bed mobility: Needs Assistance Bed Mobility: Supine to Sit Rolling: Supervision Supine to sit: Supervision Sit to supine: HOB elevated, Min assist General bed mobility comments: Sitting in recliner upon arrival    Transfers  Overall transfer level: Needs assistance Equipment used: Rollator (4 wheels) Transfers: Sit to/from Stand Sit to Stand: Min guard General transfer comment: Min Guard A for safety    Ambulation / Gait / Stairs / Emergency planning/management officer  Ambulation/Gait Ambulation/Gait assistance: Counsellor (Feet): 300 Feet Assistive device: Rollator (4 wheels) Gait Pattern/deviations: Step-through pattern, Decreased stride length General Gait Details: Pt requiring min guard assist for safety, overall fairly slow and steady pace over level surfaces, min cues for environmental and obstacle navigation Gait velocity: decreased Gait velocity interpretation: <1.31 ft/sec, indicative of household ambulator    Posture / Balance Dynamic Sitting Balance Sitting balance - Comments: Pt with improved midline alignment statically sitting EOB today, min guard for safety. Static Standing Balance Rhomberg - Eyes Opened: 5 (seconds, LOB modA to recover) Balance Overall balance assessment: Needs assistance Sitting-balance support: No upper extremity supported, Feet supported Sitting balance-Leahy Scale: Good Sitting balance - Comments: Pt with improved midline alignment statically sitting EOB today, min guard for safety. Postural control: Left lateral lean Standing balance support: No upper extremity supported, During functional activity Standing balance-Leahy Scale: Poor Standing balance comment: Initially fair balance and progressing to poor with fatigue. L lateral lean Rhomberg - Eyes Opened: 5 (seconds, LOB modA to recover) High level balance activites: Side stepping, Backward walking Standardized Balance  Assessment Standardized Balance Assessment : Dynamic Gait Index Dynamic Gait Index Level Surface: Severe Impairment (without UE support, 2 with rollator) Change in Gait Speed: Moderate Impairment (without UE support, 1 with rollator) Gait with Horizontal Head Turns: Moderate Impairment (assumed without UE support, 2 with rollator) Gait with Vertical Head Turns: Moderate Impairment (assumed without UE support, 2 with rollator) Gait and Pivot Turn: Mild Impairment (without UE support, 2 with rollator) Step Over Obstacle:  Severe Impairment (without UE support, 1 with rollator) Step Around Obstacles: Moderate Impairment (without UE support, 0 with rollator) Steps: Moderate Impairment (assumed) Total Score: 7    Special needs/care consideration LVAD HM-III for 5 years. Patient and wife trained   Previous Home Environment  Living Arrangements: Spouse/significant other Available Help at Discharge: Family, Available 24 hours/day Type of Home: Apartment Home Layout: One level Home Access: Stairs to enter [employer] of Steps: 2 Bathroom Shower/Tub: Sponge bathes at baseline [employer]: Standard Bathroom Accessibility: Yes How Accessible: Accessible via walker [place]: No  Discharge Living Setting Plans for Discharge Living Setting: Patient's home, Lives with (comment), Apartment (wife) Type of Home at Discharge: Apartment Discharge Home Layout: One level Discharge Home Access: Stairs to enter Entrance Stairs-Number of Steps: 2 Discharge Bathroom Shower/Tub: Tub/shower unit (sponge bathes at baseline) Discharge Bathroom Toilet: Standard Discharge Bathroom Accessibility: Yes How Accessible: Accessible via walker Does the patient have any problems obtaining your medications?: No  Social/Family/Support Systems Patient Roles: Spouse Contact Information: wife, [name] Anticipated Caregiver: wife Anticipated Ambulance person Information: see  contacts Ability/Limitations of Caregiver: none Caregiver Availability: 24/7 Discharge Plan Discussed with Primary Caregiver: Yes Is Caregiver In Agreement with Plan?: Yes Does Caregiver/Family have Issues with Lodging/Transportation while Pt is in Rehab?: Yes  Goals Patient/Family Goal for Rehab: Mod I to supervision with PT, OT and SLP Expected length of stay: ELOS 10 to 12 days Additional Information: LVAD for 5 years; wife is his trained partner Pt/Family Agrees to Admission and willing to participate: Yes Program Orientation Provided & Reviewed with Pt/Caregiver Including Roles  & Responsibilities: Yes  Decrease burden of Care through IP rehab admission: n/a  Possible need for SNF placement upon discharge: not anticipated  Patient Condition: I have reviewed medical records from [facility], spoken with CM, and patient, spouse, and family member. I met with patient at the bedside for inpatient rehabilitation assessment.  Patient will benefit from ongoing PT, OT, and SLP, can actively participate in 3 hours of therapy a day 5 days of the week, and can make measurable gains during the admission.  Patient will also benefit from the coordinated team approach during an Inpatient Acute Rehabilitation admission.  The patient will receive intensive therapy as well as Rehabilitation physician, nursing, social worker, and care management interventions.  Due to bladder management, bowel management, safety, skin/wound care, disease management, medication administration, pain management, and patient education the patient requires 24 hour a day rehabilitation nursing.  The patient is currently min assist overall with mod cues with mobility and basic ADLs.  Discharge setting and therapy post discharge at home with home health is anticipated.  Patient has agreed to participate in the Acute Inpatient Rehabilitation Program and will admit today.  Preadmission Screen Completed By:  Cleatrice Burke, 06/28/2021 1:49 PM ______________________________________________________________________   Discussed status with Dr. Ranell Patrick on 06/28/2021 at 1349 and received approval for admission today.  Admission Coordinator:  Cleatrice Burke, RN, time  [id number] Date 06/28/2021   Assessment/Plan: Diagnosis: Cardiac debility Does the need for close, 24 hr/day Medical supervision in concert with the patient's rehab needs make it unreasonable for this patient to be served in a less intensive setting? Yes Co-Morbidities requiring supervision/potential complications: overweight, chronic systolic heart failure, ischemic cardiomyopathy, LVAD, ICH Due to bladder management, bowel management, safety, skin/wound care, disease management, medication administration, pain management, and patient education, does the patient require 24 hr/day rehab nursing? Yes Does the patient require coordinated care of  a physician, rehab nurse, PT, OT, and SLP to address physical and functional deficits in the context of the above medical diagnosis(es)? Yes Addressing deficits in the following areas: balance, endurance, locomotion, strength, transferring, bowel/bladder control, bathing, dressing, feeding, grooming, toileting, cognition, and psychosocial support Can the patient actively participate in an intensive therapy program of at least 3 hrs of therapy 5 days a week? Yes The potential for patient to make measurable gains while on inpatient rehab is excellent Anticipated functional outcomes upon discharge from inpatient rehab: supervision PT, supervision OT, supervision SLP Estimated rehab length of stay to reach the above functional goals is: 10-14 days Anticipated discharge destination: Home 10. Overall Rehab/Functional Prognosis: excellent   MD Signature: Leeroy Cha, MD

## 2021-06-25 NOTE — Progress Notes (Signed)
Physical Therapy Treatment Patient Details Name: David [name] MRN: [id number] DOB: Dec 23, 1965 Today's Date: 06/25/2021   History of Present Illness 56 y.o. male who presented 06/05/21 with worsening HA. CT showed right basal ganglia hemorrhage, intraventricular hemorrhage and obstructive hydrocephalus. S/p placement of right frontal ventriculostomy via bur hole 2/11. 2/12 Repeat head CT with resolution of hydrocephalus. 2/25 drain pulled. 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 However, pt still with significant cognitive deficits, still not recalling this PT after seeing him the last 2 days even last evening. Pt with improved ability to detect the direction of his lean, but still unable to problem-solve how to fix it. Pt with L inattention, bumping into >5 obstacles on his L and with his L hand slipping of the rollator as he fatigues. Pt is able to ambulate household distances, but is requiring up to minA to recover with LOB bouts when using an AD and minA continuously when ambulating without UE support. This is a significant decline from his PLOF, as he was IND without DME PTA. Pt is at high risk for falls, supported by his score of 7 on the DGI without UE support and his score of 11 on the DGI using the rollator. His deficits in L limb strength, coordination, and proprioception along with his deficits in balance, awareness, and problem-solving impact his safety greatly with all mobility. His functional capabilities also decline significantly more as the day progresses, increasing his caregiver burden. Pt would greatly benefit from AIR to intensely address these deficits to improve his independence and safety prior to returning home. Pt is very motivated to participate and improve and can tolerate the level of intensity in AIR.  Will continue to follow acutely.    Recommendations for follow up therapy are one component of a multi-disciplinary discharge planning process, led by the attending physician.  Recommendations may be updated based on patient status, additional functional criteria and insurance authorization.  Follow Up Recommendations  Acute inpatient rehab (3hours/day)     Assistance Recommended at Discharge Frequent or constant Supervision/Assistance  Patient can return home with the following Assistance with cooking/housework;Direct supervision/assist for financial management;Direct supervision/assist for medications management;Assist for transportation;Help with stairs or ramp for entrance;A lot of help with bathing/dressing/bathroom;Assistance with feeding;A little help with walking and/or transfers   Equipment Recommendations  Rollator (4 wheels);BSC/3in1    Recommendations for Other Services       Precautions / Restrictions Precautions Precautions: Fall;Other (comment) Precaution Comments: LVAD (x4 year hx); soft but stable BP Restrictions Weight Bearing Restrictions: No     Mobility  Bed Mobility Overal bed mobility: Needs Assistance Bed Mobility: Supine to Sit     Supine to sit: Min guard, HOB elevated     General bed mobility comments: Exiting L side of bed today with HOB elevated. Improved fluidity with bringing legs off EOB but still struggled to scoot hips to EOB and ascend trunk with L UE, but able to do so today with increased time and min guard assist.    Transfers Overall transfer level: Needs assistance Equipment used: Rollator (4 wheels) Transfers: Sit to/from Stand Sit to Stand: Min guard, Min assist           General transfer comment: PT placed rollator brakes prior to standing, pt pulling on rollator at times. Extra time  and effort coming to stand, but min guard for safety. MinA to direct buttocks to middle of chair, initially descending sideways towards his L.     Ambulation/Gait Ambulation/Gait assistance: Min assist (mod cues) Gait Distance (Feet): 300 Feet Assistive device: Rollator (4 wheels) Gait Pattern/deviations: Step-through pattern, Decreased stride length, Staggering left, Trunk flexed, Decreased step length - left Gait velocity: reduced Gait velocity interpretation: <1.31 ft/sec, indicative of household ambulator   General Gait Details: Pt with improved midline alignment in rollator initially, but as pt exceeded ~100 ft he began to drift more to his L and began to lose grip of the walker with his L UE. Cues provided to acknowledge his lean, with success, but pt unable to correct. As pt fatigued as distance progressed with rollator he began to push it distally and take rapid short steps with poor control to slow down, needing minA to recover with LOB bouts. When attempting to ambulate without UE support, he displaying continual instability and difficulty controlling his step speed and fluidity, needing minA physically to prevent LOB. Pt drifting off path and slowing gait with changes in head position, direction, and with negotiating obstacles. Pt bumped into >5 obstacles on L, likely L inattention.   Stairs             Wheelchair Mobility    Modified Rankin (Stroke Patients Only) Modified Rankin (Stroke Patients Only) Pre-Morbid Rankin Score: No symptoms Modified Rankin: Moderately severe disability     Balance Overall balance assessment: Needs assistance Sitting-balance support: Feet supported, Bilateral upper extremity supported, No upper extremity supported Sitting balance-Leahy Scale: Fair Sitting balance - Comments: Pt with improved midline alignment statically sitting EOB today, min guard for safety. Postural control: Left lateral lean Standing balance support: During functional activity, Bilateral upper extremity supported, No upper extremity supported Standing balance-Leahy Scale: Poor Standing balance comment: Pt  with L lateral lean relying on rollator or physical assistance to not lose balance.                 Standardized Balance Assessment Standardized Balance Assessment : Dynamic Gait Index   Dynamic Gait Index Level Surface: Severe Impairment (without UE support, 2 with rollator) Change in Gait Speed: Moderate Impairment (without UE support, 1 with rollator) Gait with Horizontal Head Turns: Moderate Impairment (assumed without UE support, 2 with rollator) Gait with Vertical Head Turns: Moderate Impairment (assumed without UE support, 2 with rollator) Gait and Pivot Turn: Mild Impairment (without UE support, 2 with rollator) Step Over Obstacle: Severe Impairment (without UE support, 1 with rollator) Step Around Obstacles: Moderate Impairment (without UE support, 0 with rollator) Steps: Moderate Impairment (assumed) Total Score: 7      Cognition Arousal/Alertness: Awake/alert Behavior During Therapy: Flat affect Overall Cognitive Status: Impaired/Different from baseline Area of Impairment: Attention, Memory, Following commands, Safety/judgement, Awareness, Problem solving, Orientation                   Current Attention Level: Sustained Memory: Decreased recall of precautions, Decreased short-term memory Following Commands: Follows one step commands with increased time, Follows one step commands consistently Safety/Judgement: Decreased awareness of safety, Decreased awareness of deficits Awareness: Intellectual Problem Solving: Slow processing, Decreased initiation, Difficulty sequencing, Requires verbal cues, Requires tactile cues General Comments: Pt reports he recognizes this PT's face but does not seem to recall having seen this PT the past 2 days or even last evening. Pt looking at calendar to tell PT the date, but able to recall the date later in  session. Increased time to process and sequence movements and cues. Able to detect he was leaning to L but did not correct when  cued to do so. Needs cues to maintain safety as he has poor awareness into his deficits, continually bumping into obstacles on the L and dropping things or releasing the walker with his L hand.        Exercises      General Comments General comments (skin integrity, edema, etc.): DGI without UE support = 7; DGI with rollator = 11      Pertinent Vitals/Pain Pain Assessment Pain Assessment: 0-10 Pain Score: 5  Pain Location: head Pain Descriptors / Indicators: Headache Pain Intervention(s): Limited activity within patient's tolerance, Monitored during session, Repositioned    Home Living                          Prior Function            PT Goals (current goals can now be found in the care plan section) Acute Rehab PT Goals Patient Stated Goal: to get better PT Goal Formulation: With patient Time For Goal Achievement: 07/02/21 Potential to Achieve Goals: Good Progress towards PT goals: Progressing toward goals    Frequency    Min 4X/week      PT Plan Current plan remains appropriate    Co-evaluation              AM-PAC PT "6 Clicks" Mobility   Outcome Measure  Help needed turning from your back to your side while in a flat bed without using bedrails?: None Help needed moving from lying on your back to sitting on the side of a flat bed without using bedrails?: A Little Help needed moving to and from a bed to a chair (including a wheelchair)?: A Lot (mod cues) Help needed standing up from a chair using your arms (e.g., wheelchair or bedside chair)?: A Little Help needed to walk in hospital room?: A Lot (mod cues) Help needed climbing 3-5 steps with a railing? : A Lot 6 Click Score: 16    End of Session Equipment Utilized During Treatment: Gait belt Activity Tolerance: Patient tolerated treatment well Patient left: with call bell/phone within reach;in chair Nurse Communication: Mobility status;Other (comment) (no chair alarm present) PT Visit  Diagnosis: Unsteadiness on feet (R26.81);Other abnormalities of gait and mobility (R26.89);Difficulty in walking, not elsewhere classified (R26.2);Muscle weakness (generalized) (M62.81);Other symptoms and signs involving the nervous system (R29.898)     Time: 0174-9449 PT Time Calculation (min) (ACUTE ONLY): 35 min  Charges:  $Gait Training: 8-22 mins $Therapeutic Activity: 8-22 mins                     Moishe Spice, PT, DPT Acute Rehabilitation Services  Pager: [phone] Office: [place] 06/25/2021, 9:07 AM

## 2021-06-25 NOTE — Progress Notes (Addendum)
Inpatient Rehabilitation Admissions Coordinator  ? ?Rehab consult order received due to therapy change in recommendations.. I spoke with pt's wife  by phone and she is aware and requests I pursue [facility] Medicare approval for possible admit Monday. We will meet between 12 and 1 pm today to review possible cost of care with her [employer] if approved. I contacted Dr Haroldine Laws by phone to discuss pt's noted functional decline. ? ?Danne Baxter, RN, MSN ?Rehab Admissions Coordinator ?[phone] ?06/25/2021 8:26 AM ? ?

## 2021-06-25 NOTE — Progress Notes (Addendum)
Occupational Therapy Treatment Patient Details Name: David [name] MRN: [id number] DOB: 07/16/1965 Today's Date: 06/25/2021   History of present illness 56 y.o. male who presented 06/05/21 with worsening HA. CT showed right basal ganglia hemorrhage, intraventricular hemorrhage and obstructive hydrocephalus. S/p placement of right frontal ventriculostomy via bur hole 2/11. 2/12 Repeat head CT with resolution of hydrocephalus. 2/25 drain pulled. 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 Pt participating in three part instruction following tasks - recalling first two instructions. Pt participating in LUE exercises to challenge grasp, in hand manipulation, and coordination; requiring significant time and successful 1/5 attempts. Pt also with inattention to L and knocking his LUE into door frame, wall, and objects throughout session; requiring Max cues for safety. With fatigue, pt progressing from requiring Min guard A for safety in standing to Min-Mod for left lateral lean and shuffling gait pattern demonstrating high fall risk. Update dc recommendation to CIR for intensive therapy as pt is highly motivated, has good family support, and has a change from his baseline function. Will continue to follow acutely as admitted.   Recommendations for follow up therapy are one component of a multi-disciplinary discharge planning process, led by the attending physician.  Recommendations may be updated based on patient status, additional functional criteria and insurance authorization.    Follow Up Recommendations  Acute inpatient rehab (3hours/day)    Assistance Recommended at Discharge Frequent or constant Supervision/Assistance  Patient can return home with the following  A little help with walking and/or transfers;A little  help with bathing/dressing/bathroom;Direct supervision/assist for medications management;Direct supervision/assist for financial management;Other (comment)   Equipment Recommendations  None recommended by OT    Recommendations for Other Services PT consult;Speech consult    Precautions / Restrictions Precautions Precautions: Fall;Other (comment) Precaution Comments: LVAD (x4 year hx); soft but stable BP Restrictions Weight Bearing Restrictions: No       Mobility Bed Mobility Overal bed mobility: Needs Assistance             General bed mobility comments: Sitting in recliner upon arrival    Transfers Overall transfer level: Needs assistance Equipment used: None Transfers: Sit to/from Stand Sit to Stand: Min guard           General transfer comment: Min Guard A for safety     Balance Overall balance assessment: Needs assistance Sitting-balance support: No upper extremity supported, Feet supported Sitting balance-Leahy Scale: Fair     Standing balance support: No upper extremity supported, During functional activity Standing balance-Leahy Scale: Poor Standing balance comment: Initially fair balance and progressing to poor with fatigue. L lateral lean                           ADL either performed or assessed with clinical judgement   ADL Overall ADL's : Needs assistance/impaired                         Toilet Transfer: Minimal assistance;Ambulation Toilet Transfer Details (indicate cue type and reason): Min A for balance         Functional mobility during ADLs: Minimal assistance;Min guard;Moderate assistance General ADL Comments: Pt requiring Min Guard A for initial mobility but then as he fatigued, he required atleast Min A and at times Mod A.  Leaning to L and shuffing gait. Pt switching from A/C power to batteries with Mod cues and Min hand over hand. Participating in two part instruction task and LUE exercises at sink.     Extremity/Trunk Assessment Upper Extremity Assessment Upper Extremity Assessment: Overall WFL for tasks assessed   Lower Extremity Assessment Lower Extremity Assessment: Defer to PT evaluation        Vision       Perception     Praxis      Cognition Arousal/Alertness: Awake/alert Behavior During Therapy: Flat affect Overall Cognitive Status: Impaired/Different from baseline Area of Impairment: Attention, Memory, Following commands, Safety/judgement, Awareness, Problem solving                   Current Attention Level: Sustained, Selective Memory: Decreased short-term memory, Decreased recall of precautions Following Commands: Follows one step commands with increased time, Follows multi-step commands inconsistently Safety/Judgement: Decreased awareness of safety, Decreased awareness of deficits Awareness: Intellectual, Emergent Problem Solving: Slow processing, Requires verbal cues General Comments: Pt demonstrating increased ST memory compared to prior sessions. Able to complete 2 part task including walking to nurses station and requesting a coke with ice. Pt unable to problem solve when error occur such as legs becoming tired and buckling or when bumping into objects on L. Very motived        Exercises Other Exercises Other Exercises: LUE exercises. 1) sitting at sink and creating pyramid with small cups using LUE only. Requiring increased time, cues for error recognition, and cues for attending to L side (knocking cups on L side down throughout). Successful 1/5 attempts. 2) standing at sink. stacking cups two on top of each other for 3 sets. Again requiring cues to attend to L Other Exercises: Three part instruction following task. 1) go to nurses station, 2) ask for a coke with ice, and 3) collect teddy bear. Pt able to recall 2/3 instructions and forgetting to collect teddy bear.    Shoulder Instructions       General Comments VSS on RA    Pertinent  Vitals/ Pain       Pain Assessment Pain Assessment: Faces Faces Pain Scale: Hurts a little bit Pain Location: Generalized Pain Descriptors / Indicators: Headache Pain Intervention(s): Monitored during session, Limited activity within patient's tolerance, Repositioned  Home Living                                          Prior Functioning/Environment              Frequency  Min 3X/week        Progress Toward Goals  OT Goals(current goals can now be found in the care plan section)  Progress towards OT goals: Progressing toward goals  Acute Rehab OT Goals OT Goal Formulation: With patient/family Time For Goal Achievement: 07/06/21 Potential to Achieve Goals: Good ADL Goals Pt Will Perform Grooming: with modified independence;standing Pt Will Transfer to Toilet: with modified independence;ambulating;regular height toilet Additional ADL Goal #1: Pt will perform three part path finding task with Min cues Additional ADL Goal #2: Pt will manage LVAD batteries with Min cues  Plan Discharge plan needs to be updated    Co-evaluation                 AM-PAC OT "6 Clicks" Daily Activity     Outcome Measure   Help from another  person eating meals?: A Little Help from another person taking care of personal grooming?: A Little Help from another person toileting, which includes using toliet, bedpan, or urinal?: A Little Help from another person bathing (including washing, rinsing, drying)?: A Lot Help from another person to put on and taking off regular upper body clothing?: A Lot Help from another person to put on and taking off regular lower body clothing?: A Lot 6 Click Score: 15    End of Session Equipment Utilized During Treatment: Oxygen  OT Visit Diagnosis: Cognitive communication deficit (R41.841)   Activity Tolerance Patient tolerated treatment well   Patient Left in chair;with nursing/sitter in room;with family/visitor present   Nurse  Communication Mobility status        Time: 9604-5409 OT Time Calculation (min): 26 min  Charges: OT General Charges $OT Visit: 1 Visit OT Treatments $Therapeutic Activity: 23-37 mins  Tymere Depuy MSOT, OTR/L Acute Rehab Pager: [phone] Office: [phone]  Theodoro Grist Sherif Millspaugh 06/25/2021, 11:51 AM

## 2021-06-25 NOTE — TOC CM/SW Note (Signed)
HF TOC CM made [facility] rep, Stacie that plan is for IP rehab. They will continue to follow for need at dc from IP rehab. [facility] insurance auth pending for CIR, possible dc Monday. Jonnie Finner RN3 CCM, Heart Failure TOC CM [phone]  ?

## 2021-06-25 NOTE — Progress Notes (Signed)
Inpatient Rehabilitation Admissions Coordinator  ? ?I met with wife, stepdaughter and mother in law. We dicussed goals and expectations of Cir as well as cost of care. I wait [employer] Medicare determination for possible admit Monday . ? ?Danne Baxter, RN, MSN ?Rehab Admissions Coordinator ?[phone] ?06/25/2021 1:47 PM ? ?

## 2021-06-26 ENCOUNTER — Inpatient Hospital Stay (HOSPITAL_COMMUNITY): Payer: Medicare HMO

## 2021-06-26 LAB — BASIC METABOLIC PANEL
Anion gap: 6 (ref 5–15)
BUN: 13 mg/dL (ref 6–20)
CO2: 24 mmol/L (ref 22–32)
Calcium: 8.5 mg/dL — ABNORMAL LOW (ref 8.9–10.3)
Chloride: 104 mmol/L (ref 98–111)
Creatinine, Ser: 1.38 mg/dL — ABNORMAL HIGH (ref 0.61–1.24)
GFR, Estimated: 60 mL/min — ABNORMAL LOW (ref >60)
Glucose, Bld: 87 mg/dL (ref 70–99)
Potassium: 4.1 mmol/L (ref 3.5–5.1)
Sodium: 134 mmol/L — ABNORMAL LOW (ref 135–145)

## 2021-06-26 LAB — PROTIME-INR
INR: 1.2 (ref 0.8–1.2)
Prothrombin Time: 15 seconds (ref 11.4–15.2)

## 2021-06-26 LAB — CBC
HCT: 36.8 % — ABNORMAL LOW (ref 39.0–52.0)
Hemoglobin: 11.9 g/dL — ABNORMAL LOW (ref 13.0–17.0)
MCH: 26.9 pg (ref 26.0–34.0)
MCHC: 32.3 g/dL (ref 30.0–36.0)
MCV: 83.3 fL (ref 80.0–100.0)
Platelets: 185 10*3/uL (ref 150–400)
RBC: 4.42 MIL/uL (ref 4.22–5.81)
RDW: 14.3 % (ref 11.5–15.5)
WBC: 8 10*3/uL (ref 4.0–10.5)
nRBC: 0 % (ref 0.0–0.2)

## 2021-06-26 LAB — MAGNESIUM: Magnesium: 1.7 mg/dL (ref 1.7–2.4)

## 2021-06-26 LAB — LACTATE DEHYDROGENASE: LDH: 168 U/L (ref 98–192)

## 2021-06-26 MED ORDER — WARFARIN SODIUM 2 MG PO TABS
2.0000 mg | ORAL_TABLET | Freq: Once | ORAL | Status: AC
  Administered 2021-06-26: 2 mg via ORAL
  Filled 2021-06-26: qty 1

## 2021-06-26 NOTE — Progress Notes (Signed)
Pt and family have questioned visitation policy stating numerous visitors have been allowed into the waiting area at a time despite fact that only 2 were inside the room at a time. Family has been informed of current policy by front desk, Retail banker, and Therapist, sports.  ? ?

## 2021-06-26 NOTE — Progress Notes (Signed)
Received page from bedside RN around 3 pm stating that patient has been increasingly confused, less oriented to location, and is very restless. Pt's wife and family members voicing concern that something is wrong, and they feel his mental status is worsening. BP and VAD numbers stable.  ? ?Discussed the above with Dr Aundra Dubin. Order received for STAT non-contrast CT head. Spoke with bedside nurse regarding need for head CT. She verbalized understanding.  ? ?CT head results:  ?1. No significant change in large intraparenchymal hemorrhage centered about the right caudate, with hemorrhage tracking along a prior right frontal approach drain catheter tract. ?2. Unchanged left right midline shift, approximately 0.9 cm. ?3. Underlying small-vessel white matter disease with a focus of non-acute encephalomalacia of the left frontal lobe. ? ?Emerson Monte RN ?VAD Coordinator  ?Office: [phone]  ?24/7 Pager: [phone]  ? ?

## 2021-06-26 NOTE — Progress Notes (Signed)
ANTICOAGULATION CONSULT NOTE ? ?Pharmacy Consult for warfarin ?Indication:  LVAD ? ?Allergies  ?Allergen Reactions  ? Plavix [Clopidogrel Bisulfate] Hives  ? ? ?Patient Measurements: ?Height: 5\' 5"  (165.1 cm) ?Weight: 78 kg (171 lb 15.3 oz) ?IBW/kg (Calculated) : 61.5 ? ?Vital Signs: ?Temp: 98.3 ?F (36.8 ?C) (03/04 1523) ?Temp Source: Oral (03/04 1523) ?BP: 105/91 (03/04 1523) ?Pulse Rate: 91 (03/04 1523) ? ?Labs: ?Recent Labs  ?  06/24/21 ?0100 06/25/21 ?0107 06/26/21 ?8115  ?HGB 12.6* 12.3* 11.9*  ?HCT 39.8 37.9* 36.8*  ?PLT 200 197 185  ?LABPROT 14.9 13.6 15.0  ?INR 1.2 1.0 1.2  ?CREATININE 1.35* 1.57* 1.38*  ? ? ? ?Estimated Creatinine Clearance: 56.9 mL/min (A) (by C-G formula based on SCr of 1.38 mg/dL (H)). ? ? ?Medical History: ?Past Medical History:  ?Diagnosis Date  ? AICD (automatic cardioverter/defibrillator) present   ? Anemia   ? Anxiety   ? CAD (coronary artery disease) 2009  ? AMI in 12/2007 with PCI to LAD, staged PCI to  mid/distal RCA, NSTEMI in 02/2009 with BMS to LCx  ? CHF (congestive heart failure) ([place])   ? Chronic kidney disease 11/03/2016  ? stage 3 kidney disease  ? Dysrhythmia   ? "arrythmia problems at some point", "fatal rhythms"  ? History of blood transfusion   ? "I was bleeding from was rectum" (04/19/2017)  ? HLD (hyperlipidemia)   ? HTN (hypertension)   ? Hyperlipidemia 03/15/2011  ? Ischemic cardiomyopathy   ? Admitted in 07/2010 with CHF exacerbation  ? Myocardial infarction [facility])   ? "I've had 4" (04/19/2017)  ? Pneumonia 2018 X 1  ? Seizures ([place])   ? one seizure in 04/2016 during cardiac event (04/19/2017)  ? STEMI 2009 (anterior), 2010 (lateral), and 2012 (inferior) 03/14/2011  ? ? ?Assessment: ?55 yoM on warfarin PTA for hx HM3 LVAD. Pt admitted 2/11 with ICH requiring ventriculostomy. INR 2.4 on admit reversed with vitamin K and 4FPCC. Ventriculostomy drain removed 2/25. ? ?Warfarin ordered to resume 2/27 but not given until 3/1, head CT stable. INR 1.2 slowly re-introduce  warfarin and increased INR ?CBC and LDH stable. Will give slightly higher dose tonight.  ? ?*Home warfarin dose 4mg  Fri, 2mg  AODs ? ?Goal of Therapy:  ?INR 1.5-2.0 (given ICH on warfarin within therapeutic range) ?Monitor platelets by anticoagulation protocol: Yes ?  ?Plan:  ?Warfarin 2 mg PO x1 tonight repeat ?Daily INR and CBC ? ? ? ?Bonnita Nasuti Pharm.D. CPP, BCPS ?Clinical Pharmacist ?[phone] ?06/26/2021 3:37 PM  ? ?Please check [email or web address] for unit-specific pharmacy phone numbers.  ? ? ? ?

## 2021-06-26 NOTE — Progress Notes (Signed)
Pt continues to move side ways in the bed stating he's "comfortable". PT has been assessed and encouraged by nurse and hospital staff numerous times to lay flat in the bed. When successful in efforts staff has found pt laying horizontal in the bed numerous despite change in positioning.  ? ?

## 2021-06-26 NOTE — Plan of Care (Signed)
  Problem: Nutrition: Goal: Adequate nutrition will be maintained Outcome: Progressing   Problem: Pain Managment: Goal: General experience of comfort will improve Outcome: Progressing   Problem: Safety: Goal: Ability to remain free from injury will improve Outcome: Progressing   

## 2021-06-26 NOTE — Progress Notes (Signed)
Patient ID: David [name], male   DOB: [birth year], 56 y.o.   MRN: [id number] ?  ?Advanced Heart Failure VAD Team Note ? ?PCP-Cardiologist: Glori Bickers, MD  ? ?Subjective:   ? ?2/11 Presented with ICH ?2/11 Underwent bedside ventriculostomy wit NSU ?2/12 Repeat head CT with resolution of hydrocephalus. Persistent hematoma ?2/17 Repeat CT head with increased hemorrhage in right lateral ventricle, slightly increased ventricular dilation.  The ventriculostomy drain is occluded.  ?2/18 Ventriculostomy drain replaced ?2/25 CT improved ventriculomegaly. Drain pulled ?2/27 F/u CT w/ stable intraparenchymal hemorrhage. Unchanged from prior.    ? ?Coumadin restarted 03/01 per Pharmacy. INR 1.2 today. No heparin.  ? ?Foley remains for urinary retention ? ?MAP 70s-90s ? ?Weight stable. Not on diuretics. Scr 1.36>1.35>1.57>1.38 ? ?Showing more functional decline in PT sessions. PT now recommending CIR. Narcotics have been cut back. ? ?Still with headache, unchanged.  No dyspnea.  ? ?LVAD INTERROGATION:  ?HeartMate 3 LVAD:   ?Flow 4.6  liters/min, speed 5400, power 4.2 PI 4.7. No PI events. VAD interrogated personally. Parameters stable. ? ? ?Objective:   ? ?Vital Signs:   ?Temp:  [97.9 ?F (36.6 ?C)-99.3 ?F (37.4 ?C)] 98.5 ?F (36.9 ?C) (03/04 0533) ?Pulse Rate:  [69-96] 69 (03/04 0533) ?Resp:  [16-20] 16 (03/04 0533) ?BP: (83-126)/(69-96) 100/87 (03/04 0854) ?SpO2:  [91 %-95 %] 95 % (03/04 0533) ?Weight:  [78 kg] 78 kg (03/04 0533) ?Last BM Date : 06/20/21 ?Mean arterial Pressure  80s-low90s ?Intake/Output:  ? ?Intake/Output Summary (Last 24 hours) at 06/26/2021 1116 ?Last data filed at 06/26/2021 0542 ?Gross per 24 hour  ?Intake 480 ml  ?Output 925 ml  ?Net -445 ml  ?  ? ?Physical Exam  ? ?General: Well appearing this am. NAD.  ?HEENT: Normal. ?Neck: Supple, JVP 7-8 cm. Carotids OK.  ?Cardiac:  Mechanical heart sounds with LVAD hum present.  ?Lungs:  CTAB, normal effort.  ?Abdomen:  NT, ND, no HSM. No bruits or masses. +BS   ?LVAD exit site: Well-healed and incorporated. Dressing dry and intact. No erythema or drainage. Stabilization device present and accurately applied. Driveline dressing changed daily per sterile technique. ?Extremities:  Warm and dry. No cyanosis, clubbing, rash, or edema.  ?Neuro:  Alert & oriented x 3. Cranial nerves grossly intact. Moves all 4 extremities w/o difficulty. Affect pleasant   ? ? ?Telemetry  ? ?SR 60s ? ?Labs  ? ?Basic Metabolic Panel: ?Recent Labs  ?Lab 06/22/21 ?0013 06/23/21 ?7915 06/24/21 ?0100 06/25/21 ?0107 06/26/21 ?0569  ?NA 134* 135 133* 133* 134*  ?K 4.6 4.1 4.3 4.2 4.1  ?CL 101 101 100 101 104  ?CO2 26 26 23 25 24   ?GLUCOSE 89 90 84 85 87  ?BUN 11 11 11 14 13   ?CREATININE 1.36* 1.37* 1.35* 1.57* 1.38*  ?CALCIUM 8.5* 8.7* 8.5* 8.5* 8.5*  ?MG 1.7 1.8  --  1.7 1.7  ? ? ?Liver Function Tests: ?No results for input(s): AST, ALT, ALKPHOS, BILITOT, PROT, ALBUMIN in the last 168 hours. ? ? ?No results for input(s): LIPASE, AMYLASE in the last 168 hours. ?No results for input(s): AMMONIA in the last 168 hours. ? ? ?CBC: ?Recent Labs  ?Lab 06/22/21 ?0013 06/23/21 ?7948 06/24/21 ?0100 06/25/21 ?0107 06/26/21 ?0165  ?WBC 11.3* 8.5 10.2 8.6 8.0  ?HGB 12.0* 12.5* 12.6* 12.3* 11.9*  ?HCT 38.8* 39.0 39.8 37.9* 36.8*  ?MCV 85.1 84.1 84.7 83.7 83.3  ?PLT 202 199 200 197 185  ? ? ?INR: ?Recent Labs  ?Lab 06/22/21 ?0013 06/23/21 ?5374  06/24/21 ?0100 06/25/21 ?0107 06/26/21 ?0177  ?INR 1.1 1.2 1.2 1.0 1.2  ? ? ?Other results: ? ? ? ?Imaging  ? ?No results found. ? ? ?Medications:   ? ? ?Scheduled Medications: ? amiodarone  200 mg Oral Daily  ? atorvastatin  40 mg Oral Daily  ? Chlorhexidine Gluconate Cloth  6 each Topical Daily  ? docusate sodium  200 mg Oral Daily  ? gabapentin  300 mg Oral BID  ? losartan  25 mg Oral Daily  ? magnesium oxide  400 mg Oral BID  ? mouth rinse  15 mL Mouth Rinse BID  ? pantoprazole  40 mg Oral Daily  ? polyethylene glycol  17 g Oral Daily  ? senna  2 tablet Oral QHS  ?  tamsulosin  0.4 mg Oral Daily  ? traZODone  50 mg Oral QHS  ? Warfarin - Pharmacist Dosing Inpatient   Does not apply L3903  ? ? ?Infusions: ? sodium chloride Stopped (06/19/21 1210)  ? ? ?PRN Medications: ?sodium chloride, acetaminophen, hydrALAZINE, ipratropium-albuterol, ondansetron (ZOFRAN) IV, oxyCODONE, polyethylene glycol, polyvinyl alcohol, traMADol ? ? ? ?Assessment/Plan:   ? ?1. Intracerebral hemorrhage ?- CT brain with intracerebral and intraventricular hemorrhage with obstructive hydrocephalus. INR 2.4 ?- AC reversed with Kcentra/vit k in ER ?- Underwent bedside ventriculostomy on 2/11 ?- Initial ventriculostomy drain occluded. CT head 2/17 with increased hemorrhage in right lateral ventricle, slightly increased ventricular dilation.  On 2/18, ventriculostomy drain replaced.  ?- CT 2/25 improved ventriculomegaly. Drain pulled 2/25 ?- CT 2/27 stable intraparenchymal hemorrhage in the frontal lobe and basal ?ganglia on the right. Midline shift is slightly reduced from the prior exam. ?- Neurosurgery appreciated. Continues to improve gradually.  ?- monitor w/ resumption of coumadin, no heparin.  ?  ?2. Chronic systolic CHF with HM III LVAD implant 04/20/17:  ?- Has done well with VAD support. ?- He has been seen at [facility] for transplant referral but has decided not to pursue transplant. No change ?- New oxygen requirement with pulmonary edema on 2/17 CXR => good diuresis on 2/18.  ?- Volume status stable. ?- 2/27 and 2/28 Coumadin ordered not but given. Started 03/01. INR 1.2 this am. Dosing per Pharmacy. ?- LDH stable.  ? ?3. Paroxysmal AFL with RVR ?- had DC-CV on 10/17/18 with subsequent short episodes of AF after but has been mostly in NSR on low-dose amio (100 daily) ?- Had recurrent AF in 10/22.  ?- maintaining SR.  ?- Continue amio 200 mg daily ?- Continue coumadin.  ?  ?4. h/o UGI AVM bleed ?- s/p APC x 4 lesions in 2/19 ?- No recurrent bleeding  ?- Continue PPI ?- Off ASA due to GIB.  ?- Hgb stable at  11.9 ?  ?5. AKI on CKD Stage IIIa:  ?- Creatinine baseline 1.3 - 1.7, 1.38 today.  ? ?6. VAD management ?- VAD interrogated personally. Parameters stable. ?- LDH has been stable.  ?- INR goal 2.0-2.5 Off ASA due to GIB 2/19.  ?- INR 2.4 reversed due to [place]. INR 1.2.  ?- Continue coumadin, no heparin ?- MAP stable.  ?- DL site looks good ?  ?6. HTN ?- goal MAP 70-90 with ICH  ?- losartan increased 2/14 with resultant hypotension and AKI. Hydralazine stopped.  ?- Losartan decreased to 25 mg daily on 02/18 d/t bump in Scr.  ?- MAPs 70s-90s. Continue to follow. ? ?7. CAD:   ?- s/p PCI RCA/PLOM with DES x2 and DES to mild LAD 1/18 ?-  No chest pain.  ?- Continue statin. Off ASA due to GIB ?  ?8. Urinary retention ?- continue Flomax  ?- Foley replaced 03/01 ? ?Tentative plan for CIR Monday. Continue to mobilize.  ? ? ?Length of Stay: 21 ? ?Loralie Champagne, MD ?06/26/2021, 11:16 AM ? ?VAD Team --- VAD ISSUES ONLY--- ?Pager [phone] (7am - 7am) ? ?Advanced Heart Failure Team  ?Pager [phone] (M-F; 7a - 5p)  ?Please contact [facility] for night-coverage after hours (5p -7a ) and weekends on [email or web address] ? ? ?

## 2021-06-27 LAB — BASIC METABOLIC PANEL
Anion gap: 7 (ref 5–15)
BUN: 11 mg/dL (ref 6–20)
CO2: 26 mmol/L (ref 22–32)
Calcium: 8.7 mg/dL — ABNORMAL LOW (ref 8.9–10.3)
Chloride: 102 mmol/L (ref 98–111)
Creatinine, Ser: 1.24 mg/dL (ref 0.61–1.24)
GFR, Estimated: >60 mL/min (ref >60)
Glucose, Bld: 79 mg/dL (ref 70–99)
Potassium: 3.8 mmol/L (ref 3.5–5.1)
Sodium: 135 mmol/L (ref 135–145)

## 2021-06-27 LAB — PROTIME-INR
INR: 1.3 — ABNORMAL HIGH (ref 0.8–1.2)
Prothrombin Time: 16.3 seconds — ABNORMAL HIGH (ref 11.4–15.2)

## 2021-06-27 LAB — LACTATE DEHYDROGENASE: LDH: 152 U/L (ref 98–192)

## 2021-06-27 LAB — CBC
HCT: 37.9 % — ABNORMAL LOW (ref 39.0–52.0)
Hemoglobin: 12.1 g/dL — ABNORMAL LOW (ref 13.0–17.0)
MCH: 26.9 pg (ref 26.0–34.0)
MCHC: 31.9 g/dL (ref 30.0–36.0)
MCV: 84.2 fL (ref 80.0–100.0)
Platelets: 193 10*3/uL (ref 150–400)
RBC: 4.5 MIL/uL (ref 4.22–5.81)
RDW: 14.5 % (ref 11.5–15.5)
WBC: 7.3 10*3/uL (ref 4.0–10.5)
nRBC: 0 % (ref 0.0–0.2)

## 2021-06-27 MED ORDER — WARFARIN SODIUM 2 MG PO TABS
2.0000 mg | ORAL_TABLET | Freq: Once | ORAL | Status: AC
  Administered 2021-06-27: 2 mg via ORAL
  Filled 2021-06-27: qty 1

## 2021-06-27 MED ORDER — MAGNESIUM SULFATE 2 GM/50ML IV SOLN
2.0000 g | Freq: Once | INTRAVENOUS | Status: AC
  Administered 2021-06-27: 2 g via INTRAVENOUS
  Filled 2021-06-27: qty 50

## 2021-06-27 NOTE — Progress Notes (Signed)
Patient ID: David [name], male   DOB: 07-05-1965, 56 y.o.   MRN: [id number]   Advanced Heart Failure VAD Team Note  PCP-Cardiologist: Glori Bickers, MD   Subjective:    2/11 Presented with [place] 2/11 Underwent bedside ventriculostomy wit NSU 2/12 Repeat head CT with resolution of hydrocephalus. Persistent hematoma 2/17 Repeat CT head with increased hemorrhage in right lateral ventricle, slightly increased ventricular dilation.  The ventriculostomy drain is occluded.  2/18 Ventriculostomy drain replaced 2/25 CT improved ventriculomegaly. Drain pulled 2/27 F/u CT w/ stable intraparenchymal hemorrhage. Unchanged from prior.    3/4 Head CT no change from prior  Coumadin restarted 03/01 per Pharmacy. INR 1.3 today. No heparin.   Foley remains for urinary retention  MAP 80s today though some hypertension overnight.   Weight stable. Not on diuretics. Scr 1.36>1.35>1.57>1.38>1.24  Had some confusion yesterday afternoon per wife, repeat head CT with no change from prior.  He is alert and oriented this morning.   Still with headache, unchanged.  No dyspnea.   LVAD INTERROGATION:  HeartMate 3 LVAD:   Flow 4.6  liters/min, speed 5400, power 4.0 PI 4.6. VAD interrogated personally. Parameters stable.   Objective:    Vital Signs:   Temp:  [97.6 F (36.4 C)-98.5 F (36.9 C)] 98.3 F (36.8 C) (03/05 0716) Pulse Rate:  [30-91] 30 (03/05 0716) Resp:  [13-20] 13 (03/05 0716) BP: (85-119)/(70-98) 85/75 (03/05 0817) SpO2:  [93 %-97 %] 93 % (03/05 0716) Weight:  [77.3 kg] 77.3 kg (03/05 0435) Last BM Date : 06/20/21 Mean arterial Pressure  80s-low90s Intake/Output:   Intake/Output Summary (Last 24 hours) at 06/27/2021 1100 Last data filed at 06/27/2021 0445 Gross per 24 hour  Intake 240 ml  Output 750 ml  Net -510 ml     Physical Exam   General: Well appearing this am. NAD.  HEENT: Normal. Neck: Supple, JVP 7-8 cm. Carotids OK.  Cardiac:  Mechanical heart sounds with LVAD hum  present.  Lungs:  CTAB, normal effort.  Abdomen:  NT, ND, no HSM. No bruits or masses. +BS  LVAD exit site: Well-healed and incorporated. Dressing dry and intact. No erythema or drainage. Stabilization device present and accurately applied. Driveline dressing changed daily per sterile technique. Extremities:  Warm and dry. No cyanosis, clubbing, rash, or edema.  Neuro:  Alert & oriented x 3. Cranial nerves grossly intact. Moves all 4 extremities w/o difficulty. Affect pleasant    Telemetry   SR 60s  Labs   Basic Metabolic Panel: Recent Labs  Lab 06/22/21 0013 06/23/21 0046 06/24/21 0100 06/25/21 0107 06/26/21 0044 06/27/21 0047  NA 134* 135 133* 133* 134* 135  K 4.6 4.1 4.3 4.2 4.1 3.8  CL 101 101 100 101 104 102  CO2 26 26 23 25 24 26   GLUCOSE 89 90 84 85 87 79  BUN 11 11 11 14 13 11   CREATININE 1.36* 1.37* 1.35* 1.57* 1.38* 1.24  CALCIUM 8.5* 8.7* 8.5* 8.5* 8.5* 8.7*  MG 1.7 1.8  --  1.7 1.7  --     Liver Function Tests: No results for input(s): AST, ALT, ALKPHOS, BILITOT, PROT, ALBUMIN in the last 168 hours.   No results for input(s): LIPASE, AMYLASE in the last 168 hours. No results for input(s): AMMONIA in the last 168 hours.   CBC: Recent Labs  Lab 06/23/21 0046 06/24/21 0100 06/25/21 0107 06/26/21 0044 06/27/21 0047  WBC 8.5 10.2 8.6 8.0 7.3  HGB 12.5* 12.6* 12.3* 11.9* 12.1*  HCT 39.0  39.8 37.9* 36.8* 37.9*  MCV 84.1 84.7 83.7 83.3 84.2  PLT 199 200 197 185 193    INR: Recent Labs  Lab 06/23/21 0046 06/24/21 0100 06/25/21 0107 06/26/21 0044 06/27/21 0047  INR 1.2 1.2 1.0 1.2 1.3*    Other results:    Imaging   CT HEAD WO CONTRAST (5MM)  Result Date: 06/26/2021 CLINICAL DATA:  Altered mental status, follow-up intracerebral hemorrhage, previous right frontal approach EVD EXAM: CT HEAD WITHOUT CONTRAST TECHNIQUE: Contiguous axial images were obtained from the base of the skull through the vertex without intravenous contrast. RADIATION  DOSE REDUCTION: This exam was performed according to the departmental dose-optimization program which includes automated exposure control, adjustment of the mA and/or kV according to patient size and/or use of iterative reconstruction technique. COMPARISON:  06/21/2021 FINDINGS: Brain: No significant change in large intraparenchymal hemorrhage centered about the right caudate, with hemorrhage tracking along a prior right frontal approach drain catheter tract (series 3, image 16). Unchanged left right midline shift, approximately 0.9 cm. Underlying periventricular and deep white matter hypodensity with a focus of encephalomalacia of the left frontal lobe (series 3, image 19). Vascular: No hyperdense vessel or unexpected calcification. Skull: Normal. Negative for fracture or focal lesion. Sinuses/Orbits: No acute finding. Other: None. IMPRESSION: 1. No significant change in large intraparenchymal hemorrhage centered about the right caudate, with hemorrhage tracking along a prior right frontal approach drain catheter tract. 2. Unchanged left right midline shift, approximately 0.9 cm. 3. Underlying small-vessel white matter disease with a focus of nonacute encephalomalacia of the left frontal lobe. Electronically Signed   By: Delanna Ahmadi M.D.   On: 06/26/2021 16:36     Medications:     Scheduled Medications:  amiodarone  200 mg Oral Daily   atorvastatin  40 mg Oral Daily   Chlorhexidine Gluconate Cloth  6 each Topical Daily   docusate sodium  200 mg Oral Daily   gabapentin  300 mg Oral BID   losartan  25 mg Oral Daily   magnesium oxide  400 mg Oral BID   mouth rinse  15 mL Mouth Rinse BID   pantoprazole  40 mg Oral Daily   polyethylene glycol  17 g Oral Daily   senna  2 tablet Oral QHS   tamsulosin  0.4 mg Oral Daily   traZODone  50 mg Oral QHS   warfarin  2 mg Oral ONCE-1600   Warfarin - Pharmacist Dosing Inpatient   Does not apply q1600    Infusions:  sodium chloride Stopped (06/19/21 1210)    magnesium sulfate bolus IVPB 2 g (06/27/21 1057)    PRN Medications: sodium chloride, acetaminophen, hydrALAZINE, ipratropium-albuterol, ondansetron (ZOFRAN) IV, oxyCODONE, polyethylene glycol, polyvinyl alcohol, traMADol    Assessment/Plan:    1. Intracerebral hemorrhage - CT brain with intracerebral and intraventricular hemorrhage with obstructive hydrocephalus. INR 2.4 - AC reversed with Kcentra/vit k in ER - Underwent bedside ventriculostomy on 2/11 - Initial ventriculostomy drain occluded. CT head 2/17 with increased hemorrhage in right lateral ventricle, slightly increased ventricular dilation.  On 2/18, ventriculostomy drain replaced.  - CT 2/25 improved ventriculomegaly. Drain pulled 2/25 - CT 2/27 stable intraparenchymal hemorrhage in the frontal lobe and basal ganglia on the right. Midline shift is slightly reduced from the prior exam. - CT 3/5 stable intraparenchymal hemorrhage.  - Confusion yesterday afternoon with the above CT head (no change).  Today, he is alert and fully oriented. Headache unchanged.  - monitor w/ resumption of coumadin, no heparin.  2. Chronic systolic CHF with HM III LVAD implant 04/20/17:  - Has done well with VAD support. - He has been seen at [facility] for transplant referral but has decided not to pursue transplant. No change - New oxygen requirement with pulmonary edema on 2/17 CXR => good diuresis on 2/18.  - Volume status stable. - 2/27 and 2/28 Coumadin ordered not but given. Started 03/01. INR 1.3 this am. Dosing per Pharmacy. - LDH stable.   3. Paroxysmal AFL with RVR - had DC-CV on 10/17/18 with subsequent short episodes of AF after but has been mostly in NSR on low-dose amio (100 daily) - Had recurrent AF in 10/22.  - maintaining SR.  - Continue amio 200 mg daily - Continue coumadin.    4. h/o UGI AVM bleed - s/p APC x 4 lesions in 2/19 - No recurrent bleeding  - Continue PPI - Off ASA due to GIB.  - Hgb stable at 12.1   5. AKI  on CKD Stage IIIa:  - Creatinine baseline 1.3 - 1.7, 1.24 today.   6. VAD management - VAD interrogated personally. Parameters stable. - LDH has been stable.  - INR goal 2.0-2.5 Off ASA due to GIB 2/19.  - INR 2.4 reversed due to [place]. INR 1.3.  - Continue coumadin, no heparin - MAP stable.  - DL site looks good   6. HTN - goal MAP 70-90 with ICH  - losartan increased 2/14 with resultant hypotension and AKI. Hydralazine stopped.  - Losartan decreased to 25 mg daily on 02/18 d/t bump in Scr.  - MAP 80s this morning. Continue to follow.  7. CAD:   - s/p PCI RCA/PLOM with DES x2 and DES to mild LAD 1/18 - No chest pain.  - Continue statin. Off ASA due to GIB   8. Urinary retention - continue Flomax  - Foley replaced 03/01  Tentative plan for CIR Monday. Continue to mobilize.    Length of Stay: [place], MD 06/27/2021, 11:00 AM  VAD Team --- VAD ISSUES ONLY--- Pager [phone] (7am - 7am)  Advanced Heart Failure Team  Pager [phone] (M-F; 7a - 5p)  Please contact [facility] for night-coverage after hours (5p -7a ) and weekends on [email or web address]

## 2021-06-27 NOTE — Progress Notes (Signed)
ANTICOAGULATION CONSULT NOTE ? ?Pharmacy Consult for warfarin ?Indication:  LVAD ? ?Allergies  ?Allergen Reactions  ? Plavix [Clopidogrel Bisulfate] Hives  ? ? ?Patient Measurements: ?Height: 5\' 5"  (165.1 cm) ?Weight: 77.3 kg (170 lb 6.7 oz) ?IBW/kg (Calculated) : 61.5 ? ?Vital Signs: ?Temp: 98.3 ?F (36.8 ?C) (03/05 9381) ?Temp Source: Oral (03/05 0175) ?BP: 85/75 (03/05 0817) ?Pulse Rate: 30 (03/05 0716) ? ?Labs: ?Recent Labs  ?  06/25/21 ?0107 06/26/21 ?1025 06/27/21 ?8527  ?HGB 12.3* 11.9* 12.1*  ?HCT 37.9* 36.8* 37.9*  ?PLT 197 185 193  ?LABPROT 13.6 15.0 16.3*  ?INR 1.0 1.2 1.3*  ?CREATININE 1.57* 1.38* 1.24  ? ? ? ?Estimated Creatinine Clearance: 63 mL/min (by C-G formula based on SCr of 1.24 mg/dL). ? ? ?Medical History: ?Past Medical History:  ?Diagnosis Date  ? AICD (automatic cardioverter/defibrillator) present   ? Anemia   ? Anxiety   ? CAD (coronary artery disease) 2009  ? AMI in 12/2007 with PCI to LAD, staged PCI to  mid/distal RCA, NSTEMI in 02/2009 with BMS to LCx  ? CHF (congestive heart failure) ([place])   ? Chronic kidney disease 11/03/2016  ? stage 3 kidney disease  ? Dysrhythmia   ? "arrythmia problems at some point", "fatal rhythms"  ? History of blood transfusion   ? "I was bleeding from was rectum" (04/19/2017)  ? HLD (hyperlipidemia)   ? HTN (hypertension)   ? Hyperlipidemia 03/15/2011  ? Ischemic cardiomyopathy   ? Admitted in 07/2010 with CHF exacerbation  ? Myocardial infarction [facility])   ? "I've had 4" (04/19/2017)  ? Pneumonia 2018 X 1  ? Seizures ([place])   ? one seizure in 04/2016 during cardiac event (04/19/2017)  ? STEMI 2009 (anterior), 2010 (lateral), and 2012 (inferior) 03/14/2011  ? ? ?Assessment: ?38 yoM on warfarin PTA for hx HM3 LVAD. Pt admitted 2/11 with ICH requiring ventriculostomy. INR 2.4 on admit reversed with vitamin K and 4FPCC. Ventriculostomy drain removed 2/25. ? ?Warfarin ordered to resume 2/27 but not given until 3/1, head CT stable. INR 1.3 slowly rising with   re-introduce warfarin at low dose and monitor symptoms / bleeding.  ?CBC and LDH stable.  ?*Home warfarin dose 4mg  Fri, 2mg  AODs ? ?Goal of Therapy:  ?INR 1.5-2.0 (given ICH on warfarin within therapeutic range) ?Monitor platelets by anticoagulation protocol: Yes ?  ?Plan:  ?Warfarin 2 mg PO x1 tonight repeat ?Daily INR and CBC ?-replace magnesium ? ? ?Bonnita Nasuti Pharm.D. CPP, BCPS ?Clinical Pharmacist ?[phone] ?06/27/2021 9:03 AM  ? ?Please check [email or web address] for unit-specific pharmacy phone numbers.  ? ? ? ?

## 2021-06-27 NOTE — Plan of Care (Signed)
?  Problem: Education: ?Goal: Knowledge of General Education information will improve ?Description: Including pain rating scale, medication(s)/side effects and non-pharmacologic comfort measures ?Outcome: Progressing ?  ?Problem: Clinical Measurements: ?Goal: Ability to maintain clinical measurements within normal limits will improve ?Outcome: Progressing ?  ?Problem: Health Behavior/Discharge Planning: ?Goal: Ability to manage health-related needs will improve ?Outcome: Progressing ?  ?Problem: Self-Care: ?Goal: Verbalization of feelings and concerns over difficulty with self-care will improve ?Outcome: Progressing ?  ?

## 2021-06-27 NOTE — Plan of Care (Signed)
?  Problem: Education: ?Goal: Knowledge of General Education information will improve ?Description: Including pain rating scale, medication(s)/side effects and non-pharmacologic comfort measures ?Outcome: Progressing ?  ?Problem: Clinical Measurements: ?Goal: Ability to maintain clinical measurements within normal limits will improve ?Outcome: Progressing ?Goal: Diagnostic test results will improve ?Outcome: Progressing ?  ?Problem: Nutrition: ?Goal: Adequate nutrition will be maintained ?Outcome: Progressing ?  ?Problem: Coping: ?Goal: Level of anxiety will decrease ?Outcome: Progressing ?  ?

## 2021-06-28 ENCOUNTER — Inpatient Hospital Stay (HOSPITAL_COMMUNITY)
Admission: RE | Admit: 2021-06-28 | Discharge: 2021-07-06 | DRG: 945 | Disposition: A | Discharge status: Home / Self Care | Payer: Medicare HMO | Source: Intra-hospital | Attending: Physical Medicine & Rehabilitation | Admitting: Physical Medicine & Rehabilitation

## 2021-06-28 ENCOUNTER — Encounter (HOSPITAL_COMMUNITY): Payer: Self-pay | Admitting: Physical Medicine & Rehabilitation

## 2021-06-28 DIAGNOSIS — D62 Acute posthemorrhagic anemia: Secondary | ICD-10-CM | POA: Diagnosis present

## 2021-06-28 DIAGNOSIS — F419 Anxiety disorder, unspecified: Secondary | ICD-10-CM | POA: Diagnosis present

## 2021-06-28 DIAGNOSIS — I5022 Chronic systolic (congestive) heart failure: Secondary | ICD-10-CM | POA: Diagnosis present

## 2021-06-28 DIAGNOSIS — Z79899 Other long term (current) drug therapy: Secondary | ICD-10-CM | POA: Diagnosis not present

## 2021-06-28 DIAGNOSIS — Z7901 Long term (current) use of anticoagulants: Secondary | ICD-10-CM

## 2021-06-28 DIAGNOSIS — I610 Nontraumatic intracerebral hemorrhage in hemisphere, subcortical: Secondary | ICD-10-CM | POA: Diagnosis not present

## 2021-06-28 DIAGNOSIS — I255 Ischemic cardiomyopathy: Secondary | ICD-10-CM | POA: Diagnosis present

## 2021-06-28 DIAGNOSIS — Z955 Presence of coronary angioplasty implant and graft: Secondary | ICD-10-CM

## 2021-06-28 DIAGNOSIS — E785 Hyperlipidemia, unspecified: Secondary | ICD-10-CM | POA: Diagnosis present

## 2021-06-28 DIAGNOSIS — N183 Chronic kidney disease, stage 3 unspecified: Secondary | ICD-10-CM | POA: Diagnosis present

## 2021-06-28 DIAGNOSIS — R0902 Hypoxemia: Secondary | ICD-10-CM | POA: Diagnosis present

## 2021-06-28 DIAGNOSIS — Z95810 Presence of automatic (implantable) cardiac defibrillator: Secondary | ICD-10-CM

## 2021-06-28 DIAGNOSIS — Z87891 Personal history of nicotine dependence: Secondary | ICD-10-CM | POA: Diagnosis not present

## 2021-06-28 DIAGNOSIS — K31819 Angiodysplasia of stomach and duodenum without bleeding: Secondary | ICD-10-CM | POA: Diagnosis present

## 2021-06-28 DIAGNOSIS — K5900 Constipation, unspecified: Secondary | ICD-10-CM | POA: Diagnosis present

## 2021-06-28 DIAGNOSIS — R5381 Other malaise: Principal | ICD-10-CM | POA: Diagnosis present

## 2021-06-28 DIAGNOSIS — I5023 Acute on chronic systolic (congestive) heart failure: Secondary | ICD-10-CM | POA: Diagnosis present

## 2021-06-28 DIAGNOSIS — I618 Other nontraumatic intracerebral hemorrhage: Secondary | ICD-10-CM | POA: Diagnosis not present

## 2021-06-28 DIAGNOSIS — Z888 Allergy status to other drugs, medicaments and biological substances status: Secondary | ICD-10-CM

## 2021-06-28 DIAGNOSIS — I609 Nontraumatic subarachnoid hemorrhage, unspecified: Secondary | ICD-10-CM

## 2021-06-28 DIAGNOSIS — I69119 Unspecified symptoms and signs involving cognitive functions following nontraumatic intracerebral hemorrhage: Secondary | ICD-10-CM | POA: Diagnosis not present

## 2021-06-28 DIAGNOSIS — I2510 Atherosclerotic heart disease of native coronary artery without angina pectoris: Secondary | ICD-10-CM | POA: Diagnosis present

## 2021-06-28 DIAGNOSIS — I69112 Visuospatial deficit and spatial neglect following nontraumatic intracerebral hemorrhage: Secondary | ICD-10-CM

## 2021-06-28 DIAGNOSIS — I252 Old myocardial infarction: Secondary | ICD-10-CM | POA: Diagnosis not present

## 2021-06-28 DIAGNOSIS — I612 Nontraumatic intracerebral hemorrhage in hemisphere, unspecified: Secondary | ICD-10-CM | POA: Diagnosis not present

## 2021-06-28 DIAGNOSIS — I480 Paroxysmal atrial fibrillation: Secondary | ICD-10-CM | POA: Diagnosis present

## 2021-06-28 DIAGNOSIS — R339 Retention of urine, unspecified: Secondary | ICD-10-CM | POA: Diagnosis present

## 2021-06-28 DIAGNOSIS — I130 Hypertensive heart and chronic kidney disease with heart failure and stage 1 through stage 4 chronic kidney disease, or unspecified chronic kidney disease: Secondary | ICD-10-CM | POA: Diagnosis present

## 2021-06-28 DIAGNOSIS — I619 Nontraumatic intracerebral hemorrhage, unspecified: Secondary | ICD-10-CM | POA: Diagnosis present

## 2021-06-28 DIAGNOSIS — N1831 Chronic kidney disease, stage 3a: Secondary | ICD-10-CM | POA: Diagnosis present

## 2021-06-28 DIAGNOSIS — Z79891 Long term (current) use of opiate analgesic: Secondary | ICD-10-CM

## 2021-06-28 DIAGNOSIS — Z95811 Presence of heart assist device: Secondary | ICD-10-CM

## 2021-06-28 DIAGNOSIS — Z8249 Family history of ischemic heart disease and other diseases of the circulatory system: Secondary | ICD-10-CM

## 2021-06-28 DIAGNOSIS — I611 Nontraumatic intracerebral hemorrhage in hemisphere, cortical: Secondary | ICD-10-CM | POA: Diagnosis present

## 2021-06-28 LAB — CBC
HCT: 38 % — ABNORMAL LOW (ref 39.0–52.0)
Hemoglobin: 12.3 g/dL — ABNORMAL LOW (ref 13.0–17.0)
MCH: 27 pg (ref 26.0–34.0)
MCHC: 32.4 g/dL (ref 30.0–36.0)
MCV: 83.5 fL (ref 80.0–100.0)
Platelets: 183 10*3/uL (ref 150–400)
RBC: 4.55 MIL/uL (ref 4.22–5.81)
RDW: 14.6 % (ref 11.5–15.5)
WBC: 6.8 10*3/uL (ref 4.0–10.5)
nRBC: 0 % (ref 0.0–0.2)

## 2021-06-28 LAB — PROTIME-INR
INR: 1.2 (ref 0.8–1.2)
Prothrombin Time: 15.6 seconds — ABNORMAL HIGH (ref 11.4–15.2)

## 2021-06-28 LAB — BASIC METABOLIC PANEL
Anion gap: 7 (ref 5–15)
BUN: 11 mg/dL (ref 6–20)
CO2: 24 mmol/L (ref 22–32)
Calcium: 8.4 mg/dL — ABNORMAL LOW (ref 8.9–10.3)
Chloride: 103 mmol/L (ref 98–111)
Creatinine, Ser: 1.32 mg/dL — ABNORMAL HIGH (ref 0.61–1.24)
GFR, Estimated: >60 mL/min (ref >60)
Glucose, Bld: 91 mg/dL (ref 70–99)
Potassium: 3.7 mmol/L (ref 3.5–5.1)
Sodium: 134 mmol/L — ABNORMAL LOW (ref 135–145)

## 2021-06-28 LAB — LACTATE DEHYDROGENASE: LDH: 172 U/L (ref 98–192)

## 2021-06-28 LAB — MRSA NEXT GEN BY PCR, NASAL: MRSA by PCR Next Gen: NOT DETECTED

## 2021-06-28 LAB — MAGNESIUM: Magnesium: 1.8 mg/dL (ref 1.7–2.4)

## 2021-06-28 MED ORDER — TAMSULOSIN HCL 0.4 MG PO CAPS: 0.4000 mg | ORAL_CAPSULE | Freq: Every day | ORAL | Status: DC

## 2021-06-28 MED ORDER — LOSARTAN POTASSIUM 25 MG PO TABS: 25.0000 mg | ORAL_TABLET | Freq: Every day | ORAL | Status: DC

## 2021-06-28 MED ORDER — TOPIRAMATE 25 MG PO TABS
25.0000 mg | ORAL_TABLET | Freq: Every day | ORAL | Status: DC
  Administered 2021-06-28 – 2021-06-29 (×2): 25 mg via ORAL
  Filled 2021-06-28 (×2): qty 1

## 2021-06-28 MED ORDER — GUAIFENESIN-DM 100-10 MG/5ML PO SYRP: 5.0000 mL | ORAL_SOLUTION | Freq: Four times a day (QID) | ORAL | Status: DC | PRN

## 2021-06-28 MED ORDER — PANTOPRAZOLE SODIUM 40 MG PO TBEC
40.0000 mg | DELAYED_RELEASE_TABLET | Freq: Every day | ORAL | Status: DC
  Administered 2021-06-29 – 2021-07-06 (×8): 40 mg via ORAL
  Filled 2021-06-28 (×8): qty 1

## 2021-06-28 MED ORDER — AMIODARONE HCL 200 MG PO TABS
200.0000 mg | ORAL_TABLET | Freq: Every day | ORAL | Status: DC
  Administered 2021-06-29 – 2021-07-06 (×8): 200 mg via ORAL
  Filled 2021-06-28 (×8): qty 1

## 2021-06-28 MED ORDER — TRAMADOL HCL 50 MG PO TABS
50.0000 mg | ORAL_TABLET | Freq: Two times a day (BID) | ORAL | Status: DC | PRN
  Administered 2021-06-29 – 2021-07-01 (×3): 50 mg via ORAL
  Filled 2021-06-28 (×4): qty 1

## 2021-06-28 MED ORDER — WARFARIN SODIUM 2 MG PO TABS
2.0000 mg | ORAL_TABLET | Freq: Once | ORAL | Status: DC
Start: 2021-06-28 — End: 2021-07-06

## 2021-06-28 MED ORDER — POTASSIUM CHLORIDE CRYS ER 20 MEQ PO TBCR
40.0000 meq | EXTENDED_RELEASE_TABLET | Freq: Once | ORAL | Status: AC
  Administered 2021-06-28: 40 meq via ORAL
  Filled 2021-06-28: qty 2

## 2021-06-28 MED ORDER — ATORVASTATIN CALCIUM 40 MG PO TABS
40.0000 mg | ORAL_TABLET | Freq: Every day | ORAL | Status: DC
  Administered 2021-06-29 – 2021-07-06 (×8): 40 mg via ORAL
  Filled 2021-06-28 (×8): qty 1

## 2021-06-28 MED ORDER — CHLORHEXIDINE GLUCONATE CLOTH 2 % EX PADS
6.0000 | MEDICATED_PAD | Freq: Every day | CUTANEOUS | Status: DC
Start: 2021-06-29 — End: 2021-07-06
  Administered 2021-07-03 – 2021-07-06 (×4): 6 via TOPICAL

## 2021-06-28 MED ORDER — POLYETHYLENE GLYCOL 3350 17 G PO PACK
17.0000 g | PACK | Freq: Every day | ORAL | Status: DC
  Administered 2021-06-30 – 2021-07-06 (×6): 17 g via ORAL
  Filled 2021-06-28 (×8): qty 1

## 2021-06-28 MED ORDER — ACETAMINOPHEN 325 MG PO TABS
650.0000 mg | ORAL_TABLET | ORAL | Status: DC | PRN
  Administered 2021-06-29 – 2021-07-01 (×3): 650 mg via ORAL
  Filled 2021-06-28 (×4): qty 2

## 2021-06-28 MED ORDER — CHLORHEXIDINE GLUCONATE CLOTH 2 % EX PADS
6.0000 | MEDICATED_PAD | Freq: Every day | CUTANEOUS | Status: DC
Start: 2021-06-28 — End: 2021-07-06

## 2021-06-28 MED ORDER — IPRATROPIUM-ALBUTEROL 0.5-2.5 (3) MG/3ML IN SOLN: 3.0000 mL | RESPIRATORY_TRACT | Status: DC | PRN

## 2021-06-28 MED ORDER — PROCHLORPERAZINE MALEATE 5 MG PO TABS: 5.0000 mg | ORAL_TABLET | Freq: Four times a day (QID) | ORAL | Status: DC | PRN

## 2021-06-28 MED ORDER — BISACODYL 10 MG RE SUPP
10.0000 mg | Freq: Every day | RECTAL | Status: DC | PRN
Start: 2021-06-28 — End: 2021-07-06

## 2021-06-28 MED ORDER — TAMSULOSIN HCL 0.4 MG PO CAPS
0.4000 mg | ORAL_CAPSULE | Freq: Every day | ORAL | Status: DC
  Administered 2021-06-29 – 2021-07-06 (×8): 0.4 mg via ORAL
  Filled 2021-06-28 (×8): qty 1

## 2021-06-28 MED ORDER — POLYETHYLENE GLYCOL 3350 17 G PO PACK: 17.0000 g | PACK | Freq: Every day | ORAL | 0 refills | Status: DC

## 2021-06-28 MED ORDER — IPRATROPIUM-ALBUTEROL 0.5-2.5 (3) MG/3ML IN SOLN
3.0000 mL | RESPIRATORY_TRACT | Status: DC | PRN
Start: 2021-06-28 — End: 2021-07-06

## 2021-06-28 MED ORDER — TRAMADOL HCL 50 MG PO TABS: 50.0000 mg | ORAL_TABLET | Freq: Two times a day (BID) | ORAL | Status: DC | PRN

## 2021-06-28 MED ORDER — OXYCODONE HCL 5 MG PO TABS: 5.0000 mg | ORAL_TABLET | Freq: Two times a day (BID) | ORAL | 0 refills | Status: DC | PRN

## 2021-06-28 MED ORDER — WARFARIN SODIUM 2 MG PO TABS: 2.0000 mg | ORAL_TABLET | Freq: Once | ORAL | Status: DC

## 2021-06-28 MED ORDER — DOCUSATE SODIUM 100 MG PO CAPS: 200.0000 mg | ORAL_CAPSULE | Freq: Every day | ORAL | 0 refills | Status: DC

## 2021-06-28 MED ORDER — WARFARIN SODIUM 2 MG PO TABS
2.0000 mg | ORAL_TABLET | Freq: Once | ORAL | Status: DC
  Administered 2021-06-28: 2 mg via ORAL
  Filled 2021-06-28: qty 1

## 2021-06-28 MED ORDER — MAGNESIUM OXIDE -MG SUPPLEMENT 400 (240 MG) MG PO TABS
400.0000 mg | ORAL_TABLET | Freq: Two times a day (BID) | ORAL | Status: DC
  Administered 2021-06-28 – 2021-07-06 (×16): 400 mg via ORAL
  Filled 2021-06-28 (×16): qty 1

## 2021-06-28 MED ORDER — ALUM & MAG HYDROXIDE-SIMETH 200-200-20 MG/5ML PO SUSP: 30.0000 mL | ORAL | Status: DC | PRN

## 2021-06-28 MED ORDER — FLEET ENEMA 7-19 GM/118ML RE ENEM: 1.0000 | ENEMA | Freq: Once | RECTAL | Status: DC | PRN

## 2021-06-28 MED ORDER — ORAL CARE MOUTH RINSE: 15.0000 mL | Freq: Two times a day (BID) | OROMUCOSAL | 0 refills | Status: DC

## 2021-06-28 MED ORDER — PROCHLORPERAZINE 25 MG RE SUPP: 12.5000 mg | Freq: Four times a day (QID) | RECTAL | Status: DC | PRN

## 2021-06-28 MED ORDER — MAGNESIUM SULFATE 2 GM/50ML IV SOLN
2.0000 g | Freq: Once | INTRAVENOUS | Status: AC
  Administered 2021-06-28: 2 g via INTRAVENOUS
  Filled 2021-06-28: qty 50

## 2021-06-28 MED ORDER — HYDRALAZINE HCL 20 MG/ML IJ SOLN: 10.0000 mg | INTRAMUSCULAR | Status: DC | PRN

## 2021-06-28 MED ORDER — WARFARIN - PHARMACIST DOSING INPATIENT
Freq: Every day | Status: DC
Start: 2021-06-29 — End: 2021-07-06

## 2021-06-28 MED ORDER — ACETAMINOPHEN 325 MG PO TABS: 650.0000 mg | ORAL_TABLET | ORAL | Status: DC | PRN

## 2021-06-28 MED ORDER — HYDRALAZINE HCL 20 MG/ML IJ SOLN
10.0000 mg | INTRAMUSCULAR | Status: DC | PRN
  Filled 2021-06-28: qty 0.5

## 2021-06-28 MED ORDER — TRAZODONE HCL 50 MG PO TABS
25.0000 mg | ORAL_TABLET | Freq: Every evening | ORAL | Status: DC | PRN
  Filled 2021-06-28: qty 1

## 2021-06-28 MED ORDER — GABAPENTIN 300 MG PO CAPS
300.0000 mg | ORAL_CAPSULE | Freq: Two times a day (BID) | ORAL | Status: DC
Start: 2021-06-28 — End: 2021-06-30
  Administered 2021-06-28 – 2021-06-30 (×4): 300 mg via ORAL
  Filled 2021-06-28 (×4): qty 1

## 2021-06-28 MED ORDER — PROCHLORPERAZINE EDISYLATE 10 MG/2ML IJ SOLN: 5.0000 mg | Freq: Four times a day (QID) | INTRAMUSCULAR | Status: DC | PRN

## 2021-06-28 MED ORDER — LIDOCAINE HCL URETHRAL/MUCOSAL 2 % EX GEL
CUTANEOUS | Status: DC | PRN
Start: 2021-06-28 — End: 2021-07-06

## 2021-06-28 MED ORDER — SENNA 8.6 MG PO TABS: 2.0000 | ORAL_TABLET | Freq: Every day | ORAL | 0 refills | Status: DC

## 2021-06-28 MED ORDER — SENNOSIDES-DOCUSATE SODIUM 8.6-50 MG PO TABS
2.0000 | ORAL_TABLET | Freq: Every day | ORAL | Status: DC
  Administered 2021-06-28 – 2021-07-05 (×7): 2 via ORAL
  Filled 2021-06-28 (×8): qty 2

## 2021-06-28 MED ORDER — DIPHENHYDRAMINE HCL 12.5 MG/5ML PO ELIX: 12.5000 mg | ORAL_SOLUTION | Freq: Four times a day (QID) | ORAL | Status: DC | PRN

## 2021-06-28 MED ORDER — POLYVINYL ALCOHOL 1.4 % OP SOLN
2.0000 [drp] | OPHTHALMIC | Status: DC | PRN
  Filled 2021-06-28: qty 15

## 2021-06-28 MED ORDER — TRAZODONE HCL 50 MG PO TABS
50.0000 mg | ORAL_TABLET | Freq: Every day | ORAL | Status: DC
  Administered 2021-06-28 – 2021-07-05 (×8): 50 mg via ORAL
  Filled 2021-06-28 (×7): qty 1

## 2021-06-28 MED ORDER — LOSARTAN POTASSIUM 25 MG PO TABS
25.0000 mg | ORAL_TABLET | Freq: Every day | ORAL | Status: DC
  Administered 2021-06-29 – 2021-07-06 (×8): 25 mg via ORAL
  Filled 2021-06-28 (×8): qty 1

## 2021-06-28 MED ORDER — MAGNESIUM OXIDE -MG SUPPLEMENT 400 (240 MG) MG PO TABS: 400.0000 mg | ORAL_TABLET | Freq: Two times a day (BID) | ORAL | Status: DC

## 2021-06-28 MED ORDER — OXYCODONE HCL 5 MG PO TABS
5.0000 mg | ORAL_TABLET | Freq: Two times a day (BID) | ORAL | Status: DC | PRN
  Administered 2021-06-28: 10 mg via ORAL
  Administered 2021-06-29 (×2): 5 mg via ORAL
  Administered 2021-06-30 – 2021-07-04 (×5): 10 mg via ORAL
  Filled 2021-06-28: qty 2
  Filled 2021-06-28: qty 1
  Filled 2021-06-28 (×5): qty 2
  Filled 2021-06-28: qty 1
  Filled 2021-06-28 (×2): qty 2

## 2021-06-28 MED ORDER — POLYVINYL ALCOHOL 1.4 % OP SOLN: 2.0000 [drp] | OPHTHALMIC | 0 refills | Status: DC | PRN

## 2021-06-28 MED ORDER — ORAL CARE MOUTH RINSE
15.0000 mL | Freq: Two times a day (BID) | OROMUCOSAL | Status: DC
  Administered 2021-06-29 – 2021-07-06 (×11): 15 mL via OROMUCOSAL

## 2021-06-28 MED ORDER — POLYETHYLENE GLYCOL 3350 17 G PO PACK: 17.0000 g | PACK | Freq: Every day | ORAL | Status: DC | PRN

## 2021-06-28 NOTE — Plan of Care (Signed)
?  Problem: RH BLADDER ELIMINATION ?Goal: RH STG MANAGE BLADDER WITH ASSISTANCE ?Description: STG Manage Bladder With mod I  Assistance ?Outcome: Not Progressing; foley cath ?  ?

## 2021-06-28 NOTE — Progress Notes (Signed)
INPATIENT REHABILITATION ADMISSION NOTE ? ? ?Arrival Method: wheelchair ? ?   ?Mental Orientation:alert ? ? ?Assessment:done ? ? ?Skin:done  ? ? ?IV'S:left arm ? ? ?Pain:head 5/10 ? ? ?Tubes and Drains:LVAD; Foleycath ? ? ?Safety Measures:done ? ? ?Vital Signs:done ? ? ?Height and Weight:done ? ? ?Rehab Orientation:done ? ? ?Family:no ? ? ? ?Hakim Minniefield RNC,BSN, WTA  ?

## 2021-06-28 NOTE — Progress Notes (Signed)
Physical Therapy Treatment ?Patient Details ?Name: David [name] ?MRN: [id number] ?DOB: January 22, 1966 ?Today's Date: 06/28/2021 ? ? ?History of Present Illness 56 y.o. male who presented 06/05/21 with worsening HA. CT showed right basal ganglia hemorrhage, intraventricular hemorrhage and obstructive hydrocephalus. S/p placement of right frontal ventriculostomy via bur hole 2/11. 2/12 Repeat head CT with resolution of hydrocephalus. 2/25 drain pulled. PMH: ICD, HfrEF, HLD, HTN, s/p LVAD HM-III 04/20/18, CAD, CKD, MI, seizures ? ?  ?PT Comments  ? ? Pt progressing well towards his physical therapy goals. Reports continued headache ("5 and a half out of ten.") Pt A&Ox4, but demonstrates decreased short term memory recall and awareness. Encouraged pt to practice changing from wall power to battery on LVAD, but pt stating, "I don't need to, I've done it every day for 4 years." Pt able to participate in seated warm up exercise and ambulating limited hallway distances with a Rollator and hands on assist. Presents as a high fall risk based on decreased gait speed and safety awareness. Continue to recommend AIR to address deficits, maximize functional mobility and decrease caregiver burden. ?  ?Recommendations for follow up therapy are one component of a multi-disciplinary discharge planning process, led by the attending physician.  Recommendations may be updated based on patient status, additional functional criteria and insurance authorization. ? ?Follow Up Recommendations ? Acute inpatient rehab (3hours/day) ?  ?  ?Assistance Recommended at Discharge Frequent or constant Supervision/Assistance  ?Patient can return home with the following Assistance with cooking/housework;Direct supervision/assist for financial management;Direct supervision/assist for medications management;Assist for transportation;Help with stairs or ramp for entrance;A lot of help with bathing/dressing/bathroom;Assistance with feeding;A little help with  walking and/or transfers ?  ?Equipment Recommendations ? Rollator (4 wheels);BSC/3in1  ?  ?Recommendations for Other Services   ? ? ?  ?Precautions / Restrictions Precautions ?Precautions: Fall;Other (comment) ?Precaution Comments: LVAD (x4 year hx ?Restrictions ?Weight Bearing Restrictions: No  ?  ? ?Mobility ? Bed Mobility ?Overal bed mobility: Needs Assistance ?Bed Mobility: Supine to Sit ?  ?  ?Supine to sit: Supervision ?  ?  ?  ?  ? ?Transfers ?Overall transfer level: Needs assistance ?Equipment used: Rollator (4 wheels) ?Transfers: Sit to/from Stand ?Sit to Stand: Min guard ?  ?  ?  ?  ?  ?General transfer comment: Min Guard A for safety ?  ? ?Ambulation/Gait ?Ambulation/Gait assistance: Min guard ?Gait Distance (Feet): 300 Feet ?Assistive device: Rollator (4 wheels) ?Gait Pattern/deviations: Step-through pattern, Decreased stride length ?Gait velocity: decreased ?  ?  ?General Gait Details: Pt requiring min guard assist for safety, overall fairly slow and steady pace over level surfaces, min cues for environmental and obstacle navigation ? ? ?Stairs ?  ?  ?  ?  ?  ? ? ?Wheelchair Mobility ?  ? ?Modified Rankin (Stroke Patients Only) ?  ? ? ?  ?Balance Overall balance assessment: Needs assistance ?Sitting-balance support: No upper extremity supported, Feet supported ?Sitting balance-Leahy Scale: Good ?  ?  ?Standing balance support: No upper extremity supported, During functional activity ?Standing balance-Leahy Scale: Poor ?  ?  ?  ?  ?  ?  ?  ?  ?  ?  ?  ?  ?  ? ?  ?Cognition Arousal/Alertness: Awake/alert ?Behavior During Therapy: Flat affect ?Overall Cognitive Status: Impaired/Different from baseline ?Area of Impairment: Attention, Memory, Following commands, Safety/judgement, Awareness, Problem solving ?  ?  ?  ?  ?  ?  ?  ?  ?  ?Current Attention Level: Sustained,  Selective ?Memory: Decreased short-term memory, Decreased recall of precautions ?Following Commands: Follows one step commands with increased  time, Follows multi-step commands inconsistently ?Safety/Judgement: Decreased awareness of safety, Decreased awareness of deficits ?Awareness: Intellectual, Emergent ?Problem Solving: Slow processing, Requires verbal cues ?General Comments: Pt oriented to month/year, place, but not day of week. Poor short term memory recall ?  ?  ? ?  ?Exercises General Exercises - Lower Extremity ?Long Arc Quad: Both, 10 reps, Seated ?Hip Flexion/Marching: Both, 10 reps, Seated ? ?  ?General Comments General comments (skin integrity, edema, etc.): HR 75, BP 102/86 (93), 90-92% SpO2 on RA ?  ?  ? ?Pertinent Vitals/Pain Pain Assessment ?Pain Assessment: 0-10 ?Pain Score: 5  ?Pain Location: headache ?Pain Descriptors / Indicators: Headache ?Pain Intervention(s): Monitored during session  ? ? ?Home Living   ?  ?  ?  ?  ?  ?  ?  ?  ?  ?   ?  ?Prior Function    ?  ?  ?   ? ?PT Goals (current goals can now be found in the care plan section) Acute Rehab PT Goals ?Patient Stated Goal: to get better ?Potential to Achieve Goals: Good ?Progress towards PT goals: Progressing toward goals ? ?  ?Frequency ? ? ? Min 4X/week ? ? ? ?  ?PT Plan Current plan remains appropriate  ? ? ?Co-evaluation   ?  ?  ?  ?  ? ?  ?AM-PAC PT "6 Clicks" Mobility   ?Outcome Measure ? Help needed turning from your back to your side while in a flat bed without using bedrails?: None ?Help needed moving from lying on your back to sitting on the side of a flat bed without using bedrails?: A Little ?Help needed moving to and from a bed to a chair (including a wheelchair)?: A Little ?Help needed standing up from a chair using your arms (e.g., wheelchair or bedside chair)?: A Little ?Help needed to walk in hospital room?: A Little ?Help needed climbing 3-5 steps with a railing? : A Lot ?6 Click Score: 18 ? ?  ?End of Session Equipment Utilized During Treatment: Gait belt ?Activity Tolerance: Patient tolerated treatment well ?Patient left: with call bell/phone within reach;in  chair ?Nurse Communication: Mobility status ?PT Visit Diagnosis: Unsteadiness on feet (R26.81);Other abnormalities of gait and mobility (R26.89);Difficulty in walking, not elsewhere classified (R26.2);Muscle weakness (generalized) (M62.81);Other symptoms and signs involving the nervous system (R29.898) ?  ? ? ?Time: 6837-2902 ?PT Time Calculation (min) (ACUTE ONLY): 46 min ? ?Charges:  $Therapeutic Activity: 38-52 mins          ?          ? ?Wyona Almas, PT, DPT ?Acute Rehabilitation Services ?Pager [phone] ?Office [phone] ? ? ? ?Deno Etienne ?06/28/2021, 1:19 PM ? ?

## 2021-06-28 NOTE — Progress Notes (Signed)
Inpatient Rehabilitation Admissions Coordinator  ? ?I await insurance determination for a possible [facility] admit . ? ?Danne Baxter, RN, MSN ?Rehab Admissions Coordinator ?([phone] ?06/28/2021 11:05 AM ? ?

## 2021-06-28 NOTE — Progress Notes (Signed)
LVAD Coordinator Rounding Note: ? ?Admitted 06/05/21 due to [place] with hydrocephalus to Dr Bensimhon's service. Ventriculostomy placed at bedside by Dr Arnoldo Morale.  ? ?HM III LVAD implanted on 04/20/17 by Dr Cyndia Bent under destination therapy criteria. ? ?2/11 Presented with ICH ?2/11 Underwent bedside ventriculostomy wit NSU ?2/12 Repeat head CT with resolution of hydrocephalus. Persistent hematoma ?2/17 Repeat CT head with increased hemorrhage in right lateral ventricle, slightly increased ventricular dilation.  The ventriculostomy drain is occluded.  ?2/18 Ventriculostomy drain replaced ?2/25 CT improved ventriculomegaly. Drain pulled ?2/27 F/u CT w/ stable intraparenchymal hemorrhage. Unchanged from prior.    ?3/4 Head CT no change from prior ? ?Per Dr Arnoldo Morale (neurosurgery) he will sign off for now. Pt will need follow up with him in his office in a few weeks. Will need appt at discharge.  ? ?Pt lying in bed this morning. Reports he has a 5/10 constant headache. Recently received PRN Tramadol. Pt knew my name, and is alert to person and place this morning. At times responses are delayed.  ? ?Pt waiting on insurance authorization for CIR.  ? ?Encouraged continued participation with PT/OT despite headache. He verbalized understanding. Encouraged increased PO intake. He verbalized understanding.  ? ?Foley in place due to urinary retention.  ? ?Restarted Coumadin 3/1 last night. Will closely monitor for neuro changes with anticoagulation restart. Pt had repeat head CT Saturday evening due to changes in mentation...CT head negative. ? ?Vital signs: ?Temp: 98.6 ?HR: 73 ?Doppler Pressure: 88 ?Automatic BP: 92/78 (84) ?O2 Sat: 93% on RA ?Wt:182.3>186>186.5>...>189.6>187.6>185.6>191>176.1>172.6>171.3>171.9>168.7 lbs   ? ?LVAD interrogation reveals:  ?Speed: 5400 ?Flow: 4.8 ?Power:  4w ?PI: 3.2 ?Hematocrit: 38 ? ?Alarms: none ?Events: none ? ?Fixed speed: 5400 ?Low speed limit: 5100 ? ?Drive Line: Existing VAD dressing removed  and site care performed using sterile technique. Drive line exit site cleaned with Chlora prep applicators x 2, allowed to dry, and Sorbaview dressing with Silverlon patch applied. Exit site healed and incorporated, the velour is fully implanted at exit site. No redness, tenderness, drainage, foul odor or rash noted. Drive line anchor re-applied. Pt denies fever or chills. Weekly dressing changes per bedside RN or pt's wife. Next dressing change due: 07/05/21. ? ?Labs:  ?LDH trend: 166>176>157>162>165>190>205>176>178>174>173>171>174>191>158>192>165>172 ? ?INR trend: 1.0>1.2>1.1>1.2>1.1>1.2>1.1>1.0>1.1>1.0>1.1>1.2>1.2>1.0>1.2 ? ? ?Anticoagulation Plan: ?-INR Goal: 1.5-2   ?-ASA Dose: none due to hx of GI bleed ? ?Device: ?- Medtronic BiV ?-Therapies: on- VF 200, VT 167 ? AT 171 monitored ?- Last check: 04/29/21 ? ?Adverse Events on VAD: ?- Readmitted 2/19 hgb 3.8. INR 2.9.transfused 4u pRBCs. 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  ? ?Plan/Recommendations:  ?1. Call VAD coordinator for any VAD equipment or drive line issues ?2. Weekly drive line dressing change per bedside RN or pt's wife. Next dressing change due 07/05/21. ? ? ?Tanda Rockers RN ?VAD Coordinator  ?Office: [phone]  ?24/7 Pager: [phone]  ? ? ?  ?

## 2021-06-28 NOTE — Progress Notes (Addendum)
Patient ID: David [name], male   DOB: 1965/08/08, 56 y.o.   MRN: [id number]   Advanced Heart Failure VAD Team Note  PCP-Cardiologist: Glori Bickers, MD   Subjective:    2/11 Presented with [place] 2/11 Underwent bedside ventriculostomy wit NSU 2/12 Repeat head CT with resolution of hydrocephalus. Persistent hematoma 2/17 Repeat CT head with increased hemorrhage in right lateral ventricle, slightly increased ventricular dilation.  The ventriculostomy drain is occluded.  2/18 Ventriculostomy drain replaced 2/25 CT improved ventriculomegaly. Drain pulled 2/27 F/u CT w/ stable intraparenchymal hemorrhage. Unchanged from prior.    3/4 Confusion. Head CT no change from prior.  Coumadin restarted 03/01 per Pharmacy. INR 1.2.   Foley remains for urinary retention  MAPs predominately 70s-80s  Weight stable. Not on diuretics. Scr 1.36>1.35>1.57>1.38>1.24>1.32  Still with headache. No nausea. Denies dyspnea.     LVAD INTERROGATION:  HeartMate 3 LVAD:   Flow 4.7 liters/min, speed 5400, power 4 PI 4.1. 4 PI events. VAD interrogated personally. Parameters stable.   Objective:    Vital Signs:   Temp:  [98.1 F (36.7 C)-98.6 F (37 C)] 98.6 F (37 C) (03/06 0351) Pulse Rate:  [30-106] 106 (03/06 0351) Resp:  [13-20] 15 (03/06 0351) BP: (83-114)/(59-102) 83/65 (03/06 0351) SpO2:  [93 %-98 %] 94 % (03/06 0351) Weight:  [76.5 kg] 76.5 kg (03/06 0351) Last BM Date : 06/27/21 Mean arterial Pressure  80s-low90s Intake/Output:   Intake/Output Summary (Last 24 hours) at 06/28/2021 0700 Last data filed at 06/27/2021 2330 Gross per 24 hour  Intake 240 ml  Output 500 ml  Net -260 ml     Physical Exam   Physical Exam: GENERAL: Lying comfortably in bed.  HEENT: normal  NECK: Supple, no JVD.  2+ bilaterally, no bruits.   CARDIAC:  Mechanical heart sounds with LVAD hum present.  LUNGS:  Clear to auscultation bilaterally.  ABDOMEN:  Soft, round, nontender, positive bowel sounds x4.      LVAD exit site: well-healed and incorporated.  Dressing dry and intact.  No erythema or drainage.  Stabilization device present and accurately applied.  Driveline dressing is being changed daily per sterile technique. EXTREMITIES:  Warm and dry, no cyanosis, clubbing, rash or edema  NEUROLOGIC:  Alert and oriented x 4.  Gait steady.  No aphasia.  No dysarthria.  Affect pleasant.      Telemetry   SR 60s, up to 5 PVCs/min  Labs   Basic Metabolic Panel: Recent Labs  Lab 06/22/21 0013 06/23/21 0046 06/24/21 0100 06/25/21 0107 06/26/21 0044 06/27/21 0047 06/28/21 0046  NA 134* 135 133* 133* 134* 135 134*  K 4.6 4.1 4.3 4.2 4.1 3.8 3.7  CL 101 101 100 101 104 102 103  CO2 26 26 23 25 24 26 24   GLUCOSE 89 90 84 85 87 79 91  BUN 11 11 11 14 13 11 11   CREATININE 1.36* 1.37* 1.35* 1.57* 1.38* 1.24 1.32*  CALCIUM 8.5* 8.7* 8.5* 8.5* 8.5* 8.7* 8.4*  MG 1.7 1.8  --  1.7 1.7  --  1.8    Liver Function Tests: No results for input(s): AST, ALT, ALKPHOS, BILITOT, PROT, ALBUMIN in the last 168 hours.   No results for input(s): LIPASE, AMYLASE in the last 168 hours. No results for input(s): AMMONIA in the last 168 hours.   CBC: Recent Labs  Lab 06/24/21 0100 06/25/21 0107 06/26/21 0044 06/27/21 0047 06/28/21 0046  WBC 10.2 8.6 8.0 7.3 6.8  HGB 12.6* 12.3* 11.9* 12.1* 12.3*  HCT 39.8 37.9* 36.8* 37.9* 38.0*  MCV 84.7 83.7 83.3 84.2 83.5  PLT 200 197 185 193 183    INR: Recent Labs  Lab 06/24/21 0100 06/25/21 0107 06/26/21 0044 06/27/21 0047 06/28/21 0046  INR 1.2 1.0 1.2 1.3* 1.2    Other results:    Imaging   CT HEAD WO CONTRAST (5MM)  Result Date: 06/26/2021 CLINICAL DATA:  Altered mental status, follow-up intracerebral hemorrhage, previous right frontal approach EVD EXAM: CT HEAD WITHOUT CONTRAST TECHNIQUE: Contiguous axial images were obtained from the base of the skull through the vertex without intravenous contrast. RADIATION DOSE REDUCTION: This exam was  performed according to the departmental dose-optimization program which includes automated exposure control, adjustment of the mA and/or kV according to patient size and/or use of iterative reconstruction technique. COMPARISON:  06/21/2021 FINDINGS: Brain: No significant change in large intraparenchymal hemorrhage centered about the right caudate, with hemorrhage tracking along a prior right frontal approach drain catheter tract (series 3, image 16). Unchanged left right midline shift, approximately 0.9 cm. Underlying periventricular and deep white matter hypodensity with a focus of encephalomalacia of the left frontal lobe (series 3, image 19). Vascular: No hyperdense vessel or unexpected calcification. Skull: Normal. Negative for fracture or focal lesion. Sinuses/Orbits: No acute finding. Other: None. IMPRESSION: 1. No significant change in large intraparenchymal hemorrhage centered about the right caudate, with hemorrhage tracking along a prior right frontal approach drain catheter tract. 2. Unchanged left right midline shift, approximately 0.9 cm. 3. Underlying small-vessel white matter disease with a focus of nonacute encephalomalacia of the left frontal lobe. Electronically Signed   By: Delanna Ahmadi M.D.   On: 06/26/2021 16:36     Medications:     Scheduled Medications:  amiodarone  200 mg Oral Daily   atorvastatin  40 mg Oral Daily   Chlorhexidine Gluconate Cloth  6 each Topical Daily   docusate sodium  200 mg Oral Daily   gabapentin  300 mg Oral BID   losartan  25 mg Oral Daily   magnesium oxide  400 mg Oral BID   mouth rinse  15 mL Mouth Rinse BID   pantoprazole  40 mg Oral Daily   polyethylene glycol  17 g Oral Daily   senna  2 tablet Oral QHS   tamsulosin  0.4 mg Oral Daily   traZODone  50 mg Oral QHS   Warfarin - Pharmacist Dosing Inpatient   Does not apply q1600    Infusions:  sodium chloride Stopped (06/19/21 1210)    PRN Medications: sodium chloride, acetaminophen,  hydrALAZINE, ipratropium-albuterol, ondansetron (ZOFRAN) IV, oxyCODONE, polyethylene glycol, polyvinyl alcohol, traMADol    Assessment/Plan:    1. Intracerebral hemorrhage - CT brain with intracerebral and intraventricular hemorrhage with obstructive hydrocephalus. INR 2.4 - AC reversed with Kcentra/vit k in ER - Underwent bedside ventriculostomy on 2/11 - Initial ventriculostomy drain occluded. CT head 2/17 with increased hemorrhage in right lateral ventricle, slightly increased ventricular dilation.  On 2/18, ventriculostomy drain replaced.  - CT 2/25 improved ventriculomegaly. Drain pulled 2/25 - CT 2/27 stable intraparenchymal hemorrhage in the frontal lobe and basal ganglia on the right. Midline shift is slightly reduced from the prior exam. - Increased confusion on 03/04. CT with stable intraparenchymal hemorrhage.  - Continues with headache. No recurrent confusion - monitor w/ resumption of coumadin, no heparin.    2. Chronic systolic CHF with HM III LVAD implant 04/20/17:  - Has done well with VAD support. - He has been seen at [facility]  for transplant referral but has decided not to pursue transplant. No change - New oxygen requirement with pulmonary edema on 2/17 CXR => good diuresis on 2/18.  - Volume status stable. - 2/27 and 2/28 Coumadin ordered not but given. Started 03/01. INR 1.2 this am. Dosing per Pharmacy. - LDH stable.   3. Paroxysmal AFL with RVR - had DC-CV on 10/17/18 with subsequent short episodes of AF after but has been mostly in NSR on amio - Had recurrent AF in 10/22.  - maintaining SR.  - Continue amio 200 mg daily - Continue coumadin.    4. h/o UGI AVM bleed - s/p APC x 4 lesions in 2/19 - No recurrent bleeding  - Continue PPI - Off ASA due to GIB.  - Hgb stable at 12.3   5. AKI on CKD Stage IIIa:  - Creatinine baseline 1.3 - 1.7, 1.32 today.   6. VAD management - VAD interrogated personally. Parameters stable. - LDH has been stable.  - INR goal  2.0-2.5 Off ASA due to GIB 2/19.  - INR 2.4 reversed due to [place]. INR 1.2 today  - Continue coumadin, no heparin - MAP stable.  - DL site looks good   6. HTN - goal MAP 70-90 with ICH  - losartan increased 2/14 with resultant hypotension and AKI. Hydralazine stopped.  - Losartan decreased to 25 mg daily on 02/18 d/t bump in Scr.  - MAP 70s-80s. Continue to monitor.  7. CAD:   - s/p PCI RCA/PLOM with DES x2 and DES to mild LAD 1/18 - No chest pain.  - Continue statin. Off ASA due to GIB   8. Urinary retention - continue Flomax  - Foley replaced 03/01  9. Hypomagnesemia - Mag 1.8. Supp today.  Tentative plan for CIR today pending insurance approval. Continue to mobilize.   Length of Stay: [address], [place], PA-C 06/28/2021, 7:00 AM  VAD Team --- VAD ISSUES ONLY--- Pager [phone] (7am - 7am)  Advanced Heart Failure Team  Pager [phone] (M-F; 7a - 5p)  Please contact [facility] for night-coverage after hours (5p -7a ) and weekends on [email or web address]  Agree with PA note.   Still with headache, no change.  INR 1.2 today.  LVAD parameters stable .  We are making no changes, continue warfarin to gradually increase INR.    He can go to CIR today if bed available.   Loralie Champagne 06/28/2021

## 2021-06-28 NOTE — Progress Notes (Incomplete Revision)
Pt transferred to CIR room 4MW07. Patient belonging bag sent with patient/ LVAD equipment, pt black bag also sent with patient.  ?

## 2021-06-28 NOTE — Progress Notes (Signed)
Patient provided with verbal discharge instructions. Paper copy of discharge provided to patient. RN answered all questions. VSS at discharge. IV removed. Patient belongings sent with patient. Patient dc'd via  ?

## 2021-06-28 NOTE — H&P (Addendum)
Physical Medicine and Rehabilitation Admission H&P    Chief Complaint  Patient presents with   Functional decline         HPI: David [name] is a 56 year old male with history of CAD s/p ICD,  LVAD HM III12/19, gastric AVMs, LBP who  was admitted on 06/05/21 with reports of neck pain with worsening HA progressing to somnolence. He was found to have acute hemorrhage in right caudothalamic with intraventricular clot in right lateral ventricle to 4th ventricle with obstructive changes and asymmetric ballooning of right sided horns. INR 2.4. Coumadin reversed with Kcentra and EVD placed by Dr. Arnoldo Morale with improvement in HA and mentation.  Serial CT head showed resolution of hydrocephalus but increase in bleed as well as occlusion of ventriculostomy drain which was replaced on 02/18. He has had issues with intermittent confusion, hypoxia due to fluid overload requiring supplemental oxygen as well as hypotension with acute on chronic renal failure.   EVD removed on 02/25 and neurologically stable therefore NS signed off with recs to follow up on outpatient basis. Coumadin resumed on 02/28 and he did have worsening of HA with fatigue on as well as urinary retention requiring foley placement.  He had worsening of mental status with confusion and restlessness on 03/04. CT head repeated showing no significant change in size of large IVH with 0.9 cm left to right midline shift. He was found to have limitations due to HA, cognitive deficits as well as debility. CIR was recommended due to functional decline. His wife is already well trained with LVAD.   Review of Systems  Constitutional:  Negative for chills and fever.  HENT:  Negative for hearing loss and tinnitus.   Respiratory:  Positive for shortness of breath.   Cardiovascular:  Negative for chest pain and palpitations.  Gastrointestinal:  Positive for constipation. Negative for heartburn.  Genitourinary:  Negative for dysuria.   Musculoskeletal:  Positive for back pain (since pulling his back months ago).  Skin:  Negative for rash.  Neurological:  Positive for weakness and headaches.  Psychiatric/Behavioral:  Negative for memory loss.     Past Medical History:  Diagnosis Date   AICD (automatic cardioverter/defibrillator) present    Anemia    Anxiety    CAD (coronary artery disease) 2009   AMI in 12/2007 with PCI to LAD, staged PCI to  mid/distal RCA, NSTEMI in 02/2009 with BMS to LCx   CHF (congestive heart failure) ([place])    Chronic kidney disease 11/03/2016   stage 3 kidney disease   Dysrhythmia    "arrythmia problems at some point", "fatal rhythms"   History of blood transfusion    "I was bleeding from was rectum" (04/19/2017)   HLD (hyperlipidemia)    HTN (hypertension)    Hyperlipidemia 03/15/2011   Ischemic cardiomyopathy    Admitted in 07/2010 with CHF exacerbation   Myocardial infarction ([place])    "I've had 4" (04/19/2017)   Pneumonia 2018 X 1   Seizures ([place])    one seizure in 04/2016 during cardiac event (04/19/2017)   STEMI 2009 (anterior), 2010 (lateral), and 2012 (inferior) 03/14/2011    Past Surgical History:  Procedure Laterality Date   AORTIC VALVE REPAIR N/A 04/20/2017   Procedure: AORTIC VALVE REPAIR;  Surgeon: Gaye Pollack, MD;  Location: [facility];  Service: Open Heart Surgery;  Laterality: N/A;   BIV ICD INSERTION CRT-D N/A 08/01/2016   Procedure: BiV ICD Insertion CRT-D;  Surgeon: Will Meredith Leeds, MD;  Location: [place] [facility];  Service: Cardiovascular;  Laterality: N/A;   CARDIAC CATHETERIZATION N/A 05/05/2016   Procedure: Right/Left Heart Cath and Coronary Angiography;  Surgeon: Jolaine Artist, MD;  Location: [place] [facility];  Service: Cardiovascular;  Laterality: N/A;   CARDIAC CATHETERIZATION N/A 05/09/2016   Procedure: Coronary Stent Intervention;  Surgeon: Peter M Martinique, MD;  Location: [facility];  Service: Cardiovascular;  Laterality: N/A;   CARDIAC  CATHETERIZATION N/A 05/09/2016   Procedure: Intravascular Pressure Wire/FFR Study;  Surgeon: Peter M Martinique, MD;  Location: [place] [facility];  Service: Cardiovascular;  Laterality: N/A;   CARDIOVERSION N/A 10/17/2018   Procedure: CARDIOVERSION;  Surgeon: Jolaine Artist, MD;  Location: [facility];  Service: Cardiovascular;  Laterality: N/A;   CORONARY ANGIOPLASTY WITH STENT PLACEMENT     "i've got a total of 12 stents" (04/19/2017)   ELECTROPHYSIOLOGY STUDY N/A 06/24/2016   Procedure: Electrophysiology Study;  Surgeon: Deboraha Sprang, MD;  Location: [place] [facility];  Service: Cardiovascular;  Laterality: N/A;   ENTEROSCOPY N/A 06/03/2017   Procedure: ENTEROSCOPY;  Surgeon: Yetta Flock, MD;  Location[facility];  Service: Gastroenterology;  Laterality: N/A;   EPICARDIAL PACING LEAD PLACEMENT Left 11/07/2016   Procedure: LV EPICARDIAL PACING LEAD PLACEMENT VIA LEFT MINI THORACOTOMY;  Surgeon: Gaye Pollack, MD;  Location: [facility];  Service: Thoracic;  Laterality: Left;   ICD IMPLANT N/A 06/24/2016   Procedure: POSSIBLE  ICD Implant;  Surgeon: Deboraha Sprang, MD;  Location: [place] [facility];  Service: Cardiovascular;  Laterality: N/A;   INSERTION OF IMPLANTABLE LEFT VENTRICULAR ASSIST DEVICE N/A 04/20/2017   Procedure: INSERTION OF IMPLANTABLE LEFT VENTRICULAR ASSIST DEVICE, Aortic Valve repair;  Surgeon: Gaye Pollack, MD;  Location: [facility];  Service: Open Heart Surgery;  Laterality: N/A;  Heartmate 3   IR FLUORO GUIDE CV LINE RIGHT  05/08/2017   IR FLUORO GUIDE CV MIDLINE PICC RIGHT  03/14/2017   IR REMOVAL TUN CV CATH W/O FL  05/25/2017   IR US GUIDE VASC ACCESS RIGHT  03/14/2017   IR US GUIDE VASC ACCESS RIGHT  05/08/2017   LEFT HEART CATHETERIZATION WITH CORONARY ANGIOGRAM N/A 03/13/2011   Procedure: LEFT HEART CATHETERIZATION WITH CORONARY ANGIOGRAM;  Surgeon: Lorretta Harp, MD;  Location: [facility] CATH LAB;  Service: Cardiovascular;  Laterality: N/A;   MULTIPLE EXTRACTIONS WITH  ALVEOLOPLASTY N/A 04/12/2017   Procedure: Extraction of tooth #'s 2, 5-12, 17, 20-29 with alveoloplasty amd maxillary right buccal exostoses reductions.;  Surgeon: Lenn Cal, DDS;  Location: [place];  Service: Oral Surgery;  Laterality: N/A;   RIGHT HEART CATH N/A 04/19/2017   Procedure: RIGHT HEART CATH;  Surgeon: Jolaine Artist, MD;  Location: [place] [facility];  Service: Cardiovascular;  Laterality: N/A;   RIGHT/LEFT HEART CATH AND CORONARY ANGIOGRAPHY N/A 03/10/2017   Procedure: RIGHT/LEFT HEART CATH AND CORONARY ANGIOGRAPHY;  Surgeon: Jolaine Artist, MD;  Location: [place] [facility];  Service: Cardiovascular;  Laterality: N/A;   TEE WITHOUT CARDIOVERSION N/A 04/20/2017   Procedure: TRANSESOPHAGEAL ECHOCARDIOGRAM (TEE);  Surgeon: Gaye Pollack, MD;  Location: [facility];  Service: Open Heart Surgery;  Laterality: N/A;   TEE WITHOUT CARDIOVERSION N/A 10/17/2018   Procedure: TRANSESOPHAGEAL ECHOCARDIOGRAM (TEE);  Surgeon: Jolaine Artist, MD;  Location: [facility] ENDOSCOPY;  Service: Cardiovascular;  Laterality: N/A;    Family History  Problem Relation Age of Onset   Coronary artery disease Father    Hypertension Father  Hypertension Sister    Coronary artery disease Brother    Emphysema Brother    Coronary artery disease Mother    Hypertension Mother    Emphysema Mother    Hypertension Daughter    Obesity Daughter    Diabetes Other    Hypertension Other    Coronary artery disease Other     Social History:  reports that he quit smoking about 5 years ago. His smoking use included cigarettes and cigars. He has a 21.50 pack-year smoking history. He has never used smokeless tobacco. He reports that he does not drink alcohol and does not use drugs.   Allergies  Allergen Reactions   Plavix [Clopidogrel Bisulfate] Hives   Medications Prior to Admission  Medication Sig Dispense Refill   acetaminophen (TYLENOL) 325 MG tablet Take 2 tablets (650 mg total) by mouth every 4  (four) hours as needed for headache or mild pain.     amiodarone (PACERONE) 200 MG tablet Take 1 tablet (200 mg total) by mouth daily. 30 tablet 5   atorvastatin (LIPITOR) 40 MG tablet Take 1 tablet by mouth once daily 90 tablet 0   Chlorhexidine Gluconate Cloth 2 % PADS Apply 6 each topically daily.     [START ON 06/29/2021] docusate sodium (COLACE) 100 MG capsule Take 2 capsules (200 mg total) by mouth daily. 10 capsule 0   gabapentin (NEURONTIN) 300 MG capsule Take 1 capsule (300 mg total) by mouth 2 (two) times daily. 60 capsule 6   hydrALAZINE (APRESOLINE) 20 MG/ML injection Inject 0.5 mLs (10 mg total) into the vein every 2 (two) hours as needed (Keep MAP 70-90). 1 mL    ipratropium-albuterol (DUONEB) 0.5-2.5 (3) MG/3ML SOLN Take 3 mLs by nebulization every 4 (four) hours as needed. 360 mL    [START ON 06/29/2021] losartan (COZAAR) 25 MG tablet Take 1 tablet (25 mg total) by mouth daily.     magnesium oxide (MAG-OX) 400 (240 Mg) MG tablet Take 1 tablet (400 mg total) by mouth 2 (two) times daily.     Mouthwashes (MOUTH RINSE) LIQD solution 15 mLs by Mouth Rinse route 2 (two) times daily.  0   oxyCODONE (OXY IR/ROXICODONE) 5 MG immediate release tablet Take 1-2 tablets (5-10 mg total) by mouth every 12 (twelve) hours as needed for severe pain or breakthrough pain. 30 tablet 0   pantoprazole (PROTONIX) 40 MG tablet Take 1 tablet (40 mg total) by mouth daily. 90 tablet 3   [START ON 06/29/2021] polyethylene glycol (MIRALAX / GLYCOLAX) 17 g packet Take 17 g by mouth daily. 14 each 0   polyvinyl alcohol (LIQUIFILM TEARS) 1.4 % ophthalmic solution Place 2 drops into both eyes as needed for dry eyes. 15 mL 0   senna (SENOKOT) 8.6 MG TABS tablet Take 2 tablets (17.2 mg total) by mouth at bedtime. 120 tablet 0   [START ON 06/29/2021] tamsulosin (FLOMAX) 0.4 MG CAPS capsule Take 1 capsule (0.4 mg total) by mouth daily. 30 capsule    traMADol (ULTRAM) 50 MG tablet Take 1 tablet (50 mg total) by mouth every 12  (twelve) hours as needed for moderate pain. 30 tablet    traZODone (DESYREL) 50 MG tablet TAKE 1 TABLET BY MOUTH ONCE DAILY AT BEDTIME (Patient taking differently: Take 50 mg by mouth at bedtime.) 30 tablet 6   warfarin (COUMADIN) 2 MG tablet Take 1 tablet (2 mg total) by mouth one time only at 4 PM.     Home: Home Living Family/patient expects to be  discharged to:: Private residence Living Arrangements: Spouse/significant other Available Help at Discharge: Family, Available 24 hours/day Type of Home: Apartment Home Access: Stairs to enter [employer] of Steps: 2 Home Layout: One level Bathroom Shower/Tub: Sponge bathes at [place]: Standard Bathroom Accessibility: Yes Home Equipment: None   Functional History: Prior Function Prior Level of Function : Independent/Modified Independent Mobility Comments: Does not use an AD. Denies any falls. ADLs Comments: Wife changes LVAD dressing. Pt sponges at baseline. Pt manages his LVAD batteries. Pt management medication at baseline.   Functional Status:  Mobility: Bed Mobility Overal bed mobility: Needs Assistance Bed Mobility: Supine to Sit Rolling: Supervision Supine to sit: Supervision Sit to supine: HOB elevated, Min assist General bed mobility comments: Sitting in recliner upon arrival Transfers Overall transfer level: Needs assistance Equipment used: Rollator (4 wheels) Transfers: Sit to/from Stand Sit to Stand: Min guard General transfer comment: Min Guard A for safety Ambulation/Gait Ambulation/Gait assistance: Counsellor (Feet): 300 Feet Assistive device: Rollator (4 wheels) Gait Pattern/deviations: Step-through pattern, Decreased stride length General Gait Details: Pt requiring min guard assist for safety, overall fairly slow and steady pace over level surfaces, min cues for environmental and obstacle navigation Gait velocity: decreased Gait velocity interpretation: <1.31 ft/sec,  indicative of household ambulator   ADL: ADL Overall ADL's : Needs assistance/impaired Eating/Feeding: Set up, Sitting Grooming: Min guard, Moderate assistance, Oral care, Standing Grooming Details (indicate cue type and reason): Pt requiring increased time to note items in L visual field. Min Guard A for safety at first. Pt then with lateral lean with fatigue - Mod A for support as pt leaning to left in standing Upper Body Bathing: Minimal assistance, Sitting Lower Body Bathing: Moderate assistance, Sit to/from stand Lower Body Bathing Details (indicate cue type and reason): While sitting at EOB, requesting pt perform peri care. pt instantly reaches wash clothe towards his wife to clean him. Cueing pt to perform and requiring increased encouragment. Pt with difficulty terminating task and requiring cues to stop Upper Body Dressing : Moderate assistance, Sitting Upper Body Dressing Details (indicate cue type and reason): When switching from wall power to LVAD batteries, pt with poor problem solving, error recognition, sequencing requiring Mod-Max cues cues Lower Body Dressing: Minimal assistance, Sit to/from stand, Maximal assistance, Cueing for sequencing Lower Body Dressing Details (indicate cue type and reason): Pt doning underwear while seated at EOB requiring Max A over all due to cognition. Requiring Max cues to recognition error in his attempt and then problem solve a solution to compensatory for his decreased ROM. Also requiring cues for taking several rest breaks. Min A for sitting balance while bending forward. Min A to bring underwear over L heal as pt continued to attempt to pull underwear forward and not back behind heel despite cues. Toilet Transfer: Minimal assistance, Horticulturist, commercial Details (indicate cue type and reason): Min A for balance Tub/ Shower Transfer: Min guard, Ambulation Functional mobility during ADLs: Minimal assistance, Min guard, Moderate  assistance General ADL Comments: Pt requiring Min Guard A for initial mobility but then as he fatigued, he required atleast Min A and at times Mod A. Leaning to L and shuffing gait. Pt switching from A/C power to batteries with Mod cues and Min hand over hand. Participating in two part instruction task and LUE exercises at sink.   Cognition: Cognition Overall Cognitive Status: Impaired/Different from baseline Orientation Level: Oriented X4 Cognition Arousal/Alertness: Awake/alert Behavior During Therapy: Flat affect Overall Cognitive Status: Impaired/Different from  baseline Area of Impairment: Attention, Memory, Following commands, Safety/judgement, Awareness, Problem solving Orientation Level: Disoriented to, Place, Time Current Attention Level: Sustained, Selective Memory: Decreased short-term memory, Decreased recall of precautions Following Commands: Follows one step commands with increased time, Follows multi-step commands inconsistently Safety/Judgement: Decreased awareness of safety, Decreased awareness of deficits Awareness: Intellectual, Emergent Problem Solving: Slow processing, Requires verbal cues General Comments: Pt oriented to month/year, place, but not day of week. Poor short term memory recall  Physical Exam: Blood pressure 100/78, pulse 93, resp. rate 14, SpO2 100 %. Physical Exam Vitals and nursing note reviewed.  Constitutional:      Appearance: Normal appearance.     Interventions: Nasal cannula in place.     Comments: On 1 liter oxygen per [place].  HEENT: [place], AT Cardiac: LVAD hum Pulmonary: Breathing comfortably  Abdomen: NT, ND Skin:    General: Skin is warm and dry.  Neurological:     Mental Status: He is alert and oriented to person, place, and time. Strength intact. Normal speech.   Results for orders placed or performed during the hospital encounter of 06/05/21 (from the past 48 hour(s))  Lactate dehydrogenase     Status: None   Collection Time: 06/27/21  12:47 AM  Result Value Ref Range   LDH 152 98 - 192 U/L    Comment: Performed at [facility], [place] [address], [place], [place] [address]  Protime-INR     Status: Abnormal   Collection Time: 06/27/21 12:47 AM  Result Value Ref Range   Prothrombin Time 16.3 (H) 11.4 - 15.2 seconds   INR 1.3 (H) 0.8 - 1.2    Comment: (NOTE) INR goal varies based on device and disease states. Performed at [facility], [place] [address] [place], [place] [address]   CBC     Status: Abnormal   Collection Time: 06/27/21 12:47 AM  Result Value Ref Range   WBC 7.3 4.0 - 10.5 K/uL   RBC 4.50 4.22 - 5.81 MIL/uL   Hemoglobin 12.1 (L) 13.0 - 17.0 g/dL   HCT 37.9 (L) 39.0 - 52.0 %   MCV 84.2 80.0 - 100.0 fL   MCH 26.9 26.0 - 34.0 pg   MCHC 31.9 30.0 - 36.0 g/dL   RDW 14.5 11.5 - 15.5 %   Platelets 193 150 - 400 K/uL   nRBC 0.0 0.0 - 0.2 %    Comment: Performed at [facility], [place] [address], [place], [place] [address]  Basic metabolic panel     Status: Abnormal   Collection Time: 06/27/21 12:47 AM  Result Value Ref Range   Sodium 135 135 - 145 mmol/L   Potassium 3.8 3.5 - 5.1 mmol/L   Chloride 102 98 - 111 mmol/L   CO2 26 22 - 32 mmol/L   Glucose, Bld 79 70 - 99 mg/dL    Comment: Glucose reference range applies only to samples taken after fasting for at least 8 hours.   BUN 11 6 - 20 mg/dL   Creatinine, Ser 1.24 0.61 - 1.24 mg/dL   Calcium 8.7 (L) 8.9 - 10.3 mg/dL   GFR, Estimated >60 >60 mL/min    Comment: (NOTE) Calculated using the CKD-EPI Creatinine Equation (2021)    Anion gap 7 5 - 15    Comment: Performed at [facility] [address] [place], [place] [address]  Lactate dehydrogenase     Status: None   Collection Time: 06/28/21 12:46 AM  Result Value  Ref Range   LDH 172 98 - 192 U/L    Comment: Performed at [facility], [place] [address] [place], [place] [address]  Protime-INR     Status: Abnormal   Collection Time: 06/28/21 12:46 AM  Result Value Ref  Range   Prothrombin Time 15.6 (H) 11.4 - 15.2 seconds   INR 1.2 0.8 - 1.2    Comment: (NOTE) INR goal varies based on device and disease states. Performed at [facility], [place] [address], [place], [place] [address]   CBC     Status: Abnormal   Collection Time: 06/28/21 12:46 AM  Result Value Ref Range   WBC 6.8 4.0 - 10.5 K/uL   RBC 4.55 4.22 - 5.81 MIL/uL   Hemoglobin 12.3 (L) 13.0 - 17.0 g/dL   HCT 38.0 (L) 39.0 - 52.0 %   MCV 83.5 80.0 - 100.0 fL   MCH 27.0 26.0 - 34.0 pg   MCHC 32.4 30.0 - 36.0 g/dL   RDW 14.6 11.5 - 15.5 %   Platelets 183 150 - 400 K/uL   nRBC 0.0 0.0 - 0.2 %    Comment: Performed at [facility], [place] [address], [place], [place] [address]  Basic metabolic panel     Status: Abnormal   Collection Time: 06/28/21 12:46 AM  Result Value Ref Range   Sodium 134 (L) 135 - 145 mmol/L   Potassium 3.7 3.5 - 5.1 mmol/L   Chloride 103 98 - 111 mmol/L   CO2 24 22 - 32 mmol/L   Glucose, Bld 91 70 - 99 mg/dL    Comment: Glucose reference range applies only to samples taken after fasting for at least 8 hours.   BUN 11 6 - 20 mg/dL   Creatinine, Ser 1.32 (H) 0.61 - 1.24 mg/dL   Calcium 8.4 (L) 8.9 - 10.3 mg/dL   GFR, Estimated >60 >60 mL/min    Comment: (NOTE) Calculated using the CKD-EPI Creatinine Equation (2021)    Anion gap 7 5 - 15    Comment: Performed at [facility] [address] [place], [place] [address]  Magnesium     Status: None   Collection Time: 06/28/21 12:46 AM  Result Value Ref Range   Magnesium 1.8 1.7 - 2.4 mg/dL    Comment: Performed at [facility] [address], [place], [place] [address]  MRSA Next Gen by PCR, Nasal     Status: None   Collection Time: 06/28/21 12:57 AM   Specimen: Nasal Mucosa; Nasal Swab  Result Value Ref Range   MRSA by PCR Next Gen NOT DETECTED NOT DETECTED    Comment: (NOTE) The GeneXpert MRSA Assay (FDA approved for NASAL specimens only), is one component of a comprehensive MRSA  colonization surveillance program. It is not intended to diagnose MRSA infection nor to guide or monitor treatment for MRSA infections. Test performance is not FDA approved in patients less than 79 years old. Performed at [facility], [place] [address] [place], [place] [address]    No results found.    Blood pressure 100/78, pulse 93, resp. rate 14, SpO2 100 %.  Medical Problem List and Plan: 1. Functional deficits secondary to Right caudo-thalamic non traumatic ICH  -patient may shower  -ELOS/Goals: S 10-14 days  Admit to CIR 2.  Antithrombotics: -DVT/anticoagulation:  Pharmaceutical: Coumadin resumed 03/01.   -antiplatelet therapy: N/A--off ASA due to AVM/GIB 3. Headaches/Pain Management:  HA since bleed  --oxycodone  effective and being tapered to bid with ultram additionally prn. Add topamax 25mg  HS  --gabapentin for neuropathic chest pain  --back pain since injury a few months ago  4. Mood: LCSW to follow for evaluation and support.   -antipsychotic agents: N/A 5. Neuropsych: This patient is capable of making decisions on her own behalf. 6. Skin/Wound Care: Routine pressure relief measures.  7. Fluids/Electrolytes/Nutrition: Strict I/O. -daily wts. Monitor for signs of overload.  8. CAD/ICM s/p LVAD/ICD: LVAD checks daily per protocol  --continue Cozaar, Lipitor and amiodarone.  9. H/o A fib: Monitor HR TID--continue amiodarone daily.  10. Urinary retention: Continue Flomax  --+BM 03/05-->d/c foley and monitor voiding function  11. Constipation: On Senna and miralax--augment as needed 12. ABLA: Stable. Will monitor H/H with serial checks.   I have personally performed a face to face diagnostic evaluation, including, but not limited to relevant history and physical exam findings, of this patient and developed relevant assessment and plan.  Additionally, I have reviewed and concur with the physician assistant's documentation above.  Bary Leriche, PA-C   Izora Ribas, MD 06/28/2021

## 2021-06-28 NOTE — Plan of Care (Signed)
  Problem: Education: Goal: Knowledge of General Education information will improve Description: Including pain rating scale, medication(s)/side effects and non-pharmacologic comfort measures Outcome: Progressing   Problem: Health Behavior/Discharge Planning: Goal: Ability to manage health-related needs will improve Outcome: Progressing   Problem: Nutrition: Goal: Adequate nutrition will be maintained Outcome: Progressing   Problem: Coping: Goal: Level of anxiety will decrease Outcome: Progressing   Problem: Elimination: Goal: Will not experience complications related to bowel motility Outcome: Progressing   Problem: Pain Managment: Goal: General experience of comfort will improve Outcome: Progressing   Problem: Safety: Goal: Ability to remain free from injury will improve Outcome: Progressing   

## 2021-06-28 NOTE — Discharge Summary (Signed)
Advanced Heart Failure Team  Discharge Summary   Patient ID: David [name] MRN: [id number], DOB/AGE: 58-Feb-1967 58 y.o. Admit date: 06/05/2021 D/C date:     06/28/2021   Primary Discharge Diagnoses:  Intracerebral hemorrhage  Chronic systolic CHF HM III LVAD Paroxysmal AFL CKD IIIa CAD Urinary retention Hypomagnesemia   Hospital Course:   David [name] is a 56 y.o. male with history of ICD (09 PCI to LAD, mRCA, '10 PCI to Lcx and '12 mRCA), HFrEF (EF ~20% for > 5 years), HLD and HTN. S/P LVAD HM-III 04/20/18.    Readmitted 2/19 hgb 3.8. INR 2.9.transfused 4u pRBCs. 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  CT showed intracerebral and intraventricular hemorrhage with obstructive hydrocephalus. INR 2.4.  Given Kcentra and vit K. Underwent bedside ventriculostomy with NSU.   Events: 2/12 Repeat head CT with resolution of hydrocephalus. Persistent hematoma 2/17 Repeat CT head with increased hemorrhage in right lateral ventricle, slightly increased ventricular dilation.  The ventriculostomy drain occluded.  2/18 Ventriculostomy drain replaced 2/25 CT improved ventriculomegaly. Drain pulled 2/27 F/u CT w/ stable intraparenchymal hemorrhage. Unchanged from prior.    3/4 Confusion. Head CT no change from prior.  Anticoagulation initially held. Coumadin restarted on 03/01.   Coarse complicated by urinary retention requiring foley placement and volume overloaded requiring IV diuretics and later po furosemide. Last dose of furosemide on 02/21.   Seen by PT/OT. D/t decline in functional status, CIR recommended at discharge.  Hospital Course by Problem: 1. Intracerebral hemorrhage - CT brain with intracerebral and intraventricular hemorrhage with obstructive hydrocephalus. INR 2.4 - AC reversed with Kcentra/vit k in ER - Underwent bedside  ventriculostomy on 2/11 - Initial ventriculostomy drain occluded. CT head 2/17 with increased hemorrhage in right lateral ventricle, slightly increased ventricular dilation.  On 2/18, ventriculostomy drain replaced.  - CT 2/25 improved ventriculomegaly. Drain pulled 2/25 - CT 2/27 stable intraparenchymal hemorrhage in the frontal lobe and basal ganglia on the right. Midline shift is slightly reduced from the prior exam. - Increased confusion on 03/04. CT with stable intraparenchymal hemorrhage.  - Continues with headache. No recurrent confusion - monitor w/ resumption of coumadin, no heparin.    2. Chronic systolic CHF with HM III LVAD implant 04/20/17:  - Has done well with VAD support. - He has been seen at [facility] for transplant referral but has decided not to pursue transplant. No change - New oxygen requirement with pulmonary edema on 2/17 CXR => good diuresis on 2/18. Last dose of furosemide 02/21. - Volume status stable. - 2/27 and 2/28 Coumadin ordered not but given. Started 03/01. INR 1.2 this am. Dosing per Pharmacy. - LDH stable.    3. Paroxysmal AFL with RVR - had DC-CV on 10/17/18 with subsequent short episodes of AF after but has been mostly in NSR on amio - Had recurrent AF in 10/22.  - maintaining SR.  - Continue amio 200 mg daily - Continue coumadin.    4. h/o UGI AVM bleed - s/p APC x 4 lesions in 2/19 - No recurrent bleeding  - Continue PPI - Off ASA due to GIB.  - Hgb stable at 12.3   5. AKI on CKD Stage IIIa:  - Creatinine baseline 1.3 - 1.7, 1.32 today.    6. VAD management - VAD interrogated personally. Parameters stable. - LDH has been stable.  -  INR goal 2.0-2.5 Off ASA due to GIB 2/19.  - INR 2.4 reversed due to [place]. INR 1.2 today  - Continue coumadin, no heparin - MAP stable.  - DL site looks good   6. HTN - goal MAP 70-90 with ICH  - losartan increased 2/14 with resultant hypotension and AKI. Hydralazine stopped.  - Losartan decreased to 25 mg  daily on 02/18 d/t bump in Scr.  - MAP 70s-80s. Continue to monitor.   7. CAD:   - s/p PCI RCA/PLOM with DES x2 and DES to mild LAD 1/18 - No chest pain.  - Continue statin. Off ASA due to GIB   8. Urinary retention - continue Flomax  - Foley replaced 03/01   9. Hypomagnesemia - Mag 1.8. Supp today.    LVAD Interrogation HM III:   Flow 4.7 liters/min, speed 5400, power 4 PI 4.1. 4 PI events. VAD interrogated personally. Parameters stable.  Discharge Weight Range: 190>>168 lb  Discharge Vitals: Blood pressure (!) 89/68, pulse 80, temperature 98.2 F (36.8 C), temperature source Oral, resp. rate 15, height 5\' 5"  (1.651 m), weight 76.5 kg, SpO2 96 %.  Labs: Lab Results  Component Value Date   WBC 6.8 06/28/2021   HGB 12.3 (L) 06/28/2021   HCT 38.0 (L) 06/28/2021   MCV 83.5 06/28/2021   PLT 183 06/28/2021    Recent Labs  Lab 06/28/21 0046  NA 134*  K 3.7  CL 103  CO2 24  BUN 11  CREATININE 1.32*  CALCIUM 8.4*  GLUCOSE 91   Lab Results  Component Value Date   CHOL 178 04/02/2018   HDL 42 04/02/2018   LDLCALC 111 (H) 04/02/2018   TRIG 124 04/02/2018   BNP (last 3 results) No results for input(s): BNP in the last 8760 hours.  ProBNP (last 3 results) No results for input(s): PROBNP in the last 8760 hours.   Diagnostic Studies/Procedures   CT HEAD WO CONTRAST (5MM)  Result Date: 06/26/2021 CLINICAL DATA:  Altered mental status, follow-up intracerebral hemorrhage, previous right frontal approach EVD EXAM: CT HEAD WITHOUT CONTRAST TECHNIQUE: Contiguous axial images were obtained from the base of the skull through the vertex without intravenous contrast. RADIATION DOSE REDUCTION: This exam was performed according to the departmental dose-optimization program which includes automated exposure control, adjustment of the mA and/or kV according to patient size and/or use of iterative reconstruction technique. COMPARISON:  06/21/2021 FINDINGS: Brain: No significant change  in large intraparenchymal hemorrhage centered about the right caudate, with hemorrhage tracking along a prior right frontal approach drain catheter tract (series 3, image 16). Unchanged left right midline shift, approximately 0.9 cm. Underlying periventricular and deep white matter hypodensity with a focus of encephalomalacia of the left frontal lobe (series 3, image 19). Vascular: No hyperdense vessel or unexpected calcification. Skull: Normal. Negative for fracture or focal lesion. Sinuses/Orbits: No acute finding. Other: None. IMPRESSION: 1. No significant change in large intraparenchymal hemorrhage centered about the right caudate, with hemorrhage tracking along a prior right frontal approach drain catheter tract. 2. Unchanged left right midline shift, approximately 0.9 cm. 3. Underlying small-vessel white matter disease with a focus of nonacute encephalomalacia of the left frontal lobe. Electronically Signed   By: Delanna Ahmadi M.D.   On: 06/26/2021 16:36    Discharge Medications   Allergies as of 06/28/2021       Reactions   Plavix [clopidogrel Bisulfate] Hives        Medication List     STOP taking these  medications    cetirizine 10 MG tablet Commonly known as: ZyrTEC Allergy   Elderberry Immune Complex Chew   fluticasone 50 MCG/ACT nasal spray Commonly known as: FLONASE   furosemide 40 MG tablet Commonly known as: LASIX   magnesium oxide 400 MG tablet Commonly known as: MAG-OX   potassium chloride SA 20 MEQ tablet Commonly known as: Klor-Con M20       TAKE these medications    acetaminophen 325 MG tablet Commonly known as: TYLENOL Take 2 tablets (650 mg total) by mouth every 4 (four) hours as needed for headache or mild pain.   amiodarone 200 MG tablet Commonly known as: Pacerone Take 1 tablet (200 mg total) by mouth daily.   atorvastatin 40 MG tablet Commonly known as: LIPITOR Take 1 tablet by mouth once daily   Chlorhexidine Gluconate Cloth 2 % Pads Apply 6  each topically daily.   docusate sodium 100 MG capsule Commonly known as: COLACE Take 2 capsules (200 mg total) by mouth daily. Start taking on: June 29, 2021   gabapentin 300 MG capsule Commonly known as: NEURONTIN Take 1 capsule (300 mg total) by mouth 2 (two) times daily.   hydrALAZINE 20 MG/ML injection Commonly known as: APRESOLINE Inject 0.5 mLs (10 mg total) into the vein every 2 (two) hours as needed (Keep MAP 70-90).   ipratropium-albuterol 0.5-2.5 (3) MG/3ML Soln Commonly known as: DUONEB Take 3 mLs by nebulization every 4 (four) hours as needed.   losartan 25 MG tablet Commonly known as: COZAAR Take 1 tablet (25 mg total) by mouth daily. Start taking on: June 29, 2021 What changed:  medication strength how much to take   magnesium oxide 400 (240 Mg) MG tablet Commonly known as: MAG-OX Take 1 tablet (400 mg total) by mouth 2 (two) times daily.   mouth rinse Liqd solution 15 mLs by Mouth Rinse route 2 (two) times daily.   oxyCODONE 5 MG immediate release tablet Commonly known as: Oxy IR/ROXICODONE Take 1-2 tablets (5-10 mg total) by mouth every 12 (twelve) hours as needed for severe pain or breakthrough pain. What changed:  how much to take when to take this reasons to take this   pantoprazole 40 MG tablet Commonly known as: PROTONIX Take 1 tablet (40 mg total) by mouth daily.   polyethylene glycol 17 g packet Commonly known as: MIRALAX / GLYCOLAX Take 17 g by mouth daily. Start taking on: June 29, 2021   polyvinyl alcohol 1.4 % ophthalmic solution Commonly known as: LIQUIFILM TEARS Place 2 drops into both eyes as needed for dry eyes.   senna 8.6 MG Tabs tablet Commonly known as: SENOKOT Take 2 tablets (17.2 mg total) by mouth at bedtime.   tamsulosin 0.4 MG Caps capsule Commonly known as: FLOMAX Take 1 capsule (0.4 mg total) by mouth daily. Start taking on: June 29, 2021   traMADol 50 MG tablet Commonly known as: ULTRAM Take 1 tablet (50 mg  total) by mouth every 12 (twelve) hours as needed for moderate pain.   traZODone 50 MG tablet Commonly known as: DESYREL TAKE 1 TABLET BY MOUTH ONCE DAILY AT BEDTIME   warfarin 2 MG tablet Commonly known as: COUMADIN Take as directed. If you are unsure how to take this medication, talk to your nurse or doctor. Original instructions: Take 1 tablet (2 mg total) by mouth one time only at 4 PM. What changed:  how much to take how to take this when to take this additional instructions  Durable Medical Equipment  (From admission, onward)           Start     Ordered   06/22/21 1144  Heart failure home health orders  (Heart failure home health orders / Face to face)  Once       Comments: Heart Failure Follow-up Care:  Verify follow-up appointments per Patient Discharge Instructions. Confirm transportation arranged. Reconcile home medications with discharge medication list. Remove discontinued medications from use. Assist patient/caregiver to manage medications using pill box. Reinforce low sodium food selection Assessments: Vital signs and oxygen saturation at each visit. Assess home environment for safety concerns, caregiver support and availability of low-sodium foods. Consult Education officer, museum, PT/OT, Dietitian, and CNA based on assessments. Perform comprehensive cardiopulmonary assessment. Notify MD for any change in condition or weight gain of 3 pounds in one day or 5 pounds in one week with symptoms. Daily Weights and Symptom Monitoring: Ensure patient has access to scales. Teach patient/caregiver to weigh daily before breakfast and after voiding using same scale and record.    Teach patient/caregiver to track weight and symptoms and when to notify Provider. Activity: Develop individualized activity plan with patient/caregiver.   HHPT  Question Answer Comment  Heart Failure Follow-up Care Advanced Heart Failure (AHF) Clinic at [phone]   Obtain the  following labs Basic Metabolic Panel   Lab frequency Other see comments   Fax lab results to Other see comments   Diet Low Sodium Heart Healthy   Fluid restrictions: 1800 mL Fluid   Initiate Heart Failure Clinic Diuretic Protocol to be used by [place] only ( to be ordered by Heart Failure Team Providers Only) No      06/22/21 1144            Disposition   The patient will be discharged in stable condition to home. Discharge Instructions     Diet - low sodium heart healthy   Complete by: As directed    Increase activity slowly   Complete by: As directed    Page VAD Coordinator at [phone]  Notify for: any VAD alarms, sustained elevations of power >10 watts, sustained drop in Pulse Index <3   Complete by: As directed    Notify for:  any VAD alarms sustained elevations of power >10 watts sustained drop in Pulse Index <3         Follow-up Information     Newman Pies, MD Follow up in 3 week(s).   Specialty: Neurosurgery Contact information: [address] [place] [address] [phone]         [facility], [place] Follow up.   Specialty: [place] Why: [place] will call to arrange appointments Contact information: [address] [place] [address] [phone]                   Duration of Discharge Encounter: Greater than 35 minutes   Signed, [facility], Mesiah Manzo N  06/28/2021, 2:29 PM

## 2021-06-28 NOTE — Progress Notes (Signed)
ANTICOAGULATION CONSULT NOTE ? ?Pharmacy Consult for warfarin ?Indication:  LVAD ? ?Allergies  ?Allergen Reactions  ? Plavix [Clopidogrel Bisulfate] Hives  ? ? ?Patient Measurements: ?Height: 5\' 5"  (165.1 cm) ?Weight: 76.5 kg (168 lb 11.2 oz) ?IBW/kg (Calculated) : 61.5 ? ?Vital Signs: ?Temp: 98.6 ?F (37 ?C) (03/06 0351) ?Temp Source: Oral (03/06 0351) ?BP: 83/65 (03/06 0351) ?Pulse Rate: 106 (03/06 0351) ? ?Labs: ?Recent Labs  ?  06/26/21 ?7340 06/27/21 ?3709 06/28/21 ?6438  ?HGB 11.9* 12.1* 12.3*  ?HCT 36.8* 37.9* 38.0*  ?PLT 185 193 183  ?LABPROT 15.0 16.3* 15.6*  ?INR 1.2 1.3* 1.2  ?CREATININE 1.38* 1.24 1.32*  ? ? ? ?Estimated Creatinine Clearance: 58.9 mL/min (A) (by C-G formula based on SCr of 1.32 mg/dL (H)). ? ? ?Medical History: ?Past Medical History:  ?Diagnosis Date  ? AICD (automatic cardioverter/defibrillator) present   ? Anemia   ? Anxiety   ? CAD (coronary artery disease) 2009  ? AMI in 12/2007 with PCI to LAD, staged PCI to  mid/distal RCA, NSTEMI in 02/2009 with BMS to LCx  ? CHF (congestive heart failure) ([place])   ? Chronic kidney disease 11/03/2016  ? stage 3 kidney disease  ? Dysrhythmia   ? "arrythmia problems at some point", "fatal rhythms"  ? History of blood transfusion   ? "I was bleeding from was rectum" (04/19/2017)  ? HLD (hyperlipidemia)   ? HTN (hypertension)   ? Hyperlipidemia 03/15/2011  ? Ischemic cardiomyopathy   ? Admitted in 07/2010 with CHF exacerbation  ? Myocardial infarction [facility])   ? "I've had 4" (04/19/2017)  ? Pneumonia 2018 X 1  ? Seizures ([place])   ? one seizure in 04/2016 during cardiac event (04/19/2017)  ? STEMI 2009 (anterior), 2010 (lateral), and 2012 (inferior) 03/14/2011  ? ? ?Assessment: ?65 yoM on warfarin PTA for hx HM3 LVAD. Pt admitted 2/11 with ICH requiring ventriculostomy. INR 2.4 on admit reversed with vitamin K and 4FPCC. Ventriculostomy drain removed 2/25. ? ?Warfarin ordered to resume 2/27 but not given until 3/1, head CT stable. INR remains low at 1.2  today, CBC and LDH stable. ? ?*Home warfarin dose 4mg  Fri, 2mg  AODs ? ?Goal of Therapy:  ?INR 1.5-2.0 (given ICH on warfarin within therapeutic range) ?Monitor platelets by anticoagulation protocol: Yes ?  ?Plan:  ?-Warfarin 2mg  PO x1 ?-Daily INR ? ?Arrie Senate, PharmD, BCPS, BCCP ?Clinical Pharmacist ?[phone] ?Please check AMION for all [place] numbers ?06/28/2021 ? ? ? ? ?

## 2021-06-28 NOTE — Progress Notes (Signed)
?  Inpatient Rehabilitation Admissions Coordinator  ? ?I have received insurance approval for [facility] admit and bed is available today. I met with patient and his wife at bedside and they are in agreement. I will alert acute team and TOC to make the arrangements to admit today. I contacted Dr Aundra Dubin and he is aware. ? ?Danne Baxter, RN, MSN ?Rehab Admissions Coordinator ?[phone] ?06/28/2021 1:47 PM ? ? ? ?

## 2021-06-28 NOTE — Progress Notes (Signed)
Izora Ribas, MD  Physician Physical Medicine and Rehabilitation PMR Pre-admission    Signed Date of Service:  06/25/2021  1:51 PM  Related encounter: ED to Hosp-Admission (Current) from 06/05/2021 in [facility]      Show:Clear all $RemoveBefore'[x]'rWbNQKlbTjuIe$ Written$R'[x]'IG$ Templat'[]'$ Copied  Added by: $RemoveB'[x]'HLZuUyuk$ Cristina Gong, RN$RemoveBeforeDE'[x]'cAXoZekHoIqqETT$ Raulkar, Clide Deutscher, MD  $R'[]'Oz$ [place] for details                                                                                                                                                                                                                                                                                                                                                                                                                                           PMR Admission Coordinator Pre-Admission Assessment   Patient: David [name] is an 56 y.o., male MRN: [id number] DOB: May 29, 1965 Height: $RemoveBefo'5\' 5"'ScQSdlNGgZi$  (165.1 cm) Weight: 76.5 kg   Insurance Information HMO: yes    PPO:      PCP:      IPA:      80/20:      OTHER:  PRIMARY: [employer]      Policy#: [id number]      Subscriber: pt CM Name: Cassie      Phone#: [phone]     Fax#: [phone] Pre-Cert#: [id number]   approved for 7 days with f/u with  Pat at ext [phone]   Employer:  Benefits:  Phone #: [phone]     Name: 3/3 Eff. Date: 04/25/2021     Deduct: none      Out of Pocket Max: $3400      Life Max: none CIR: $295 co pay per day days 1 until 6      SNF: no copay days 1 until 20; $196 co apy per day days 21 until 100 Outpatient: $10 to $20 per visit     Co-Pay: visits per medical neccesity Home Health: 100%      Co-Pay: visits per medical neccesity DME: 80%     Co-Pay: 20% Providers: in network  SECONDARY: none         Financial  Counselor:       Phone#:    The Actuary for patients in Inpatient Rehabilitation Facilities with attached Privacy Act [place] Records was provided and verbally reviewed with: Patient and Family   Emergency Contact Information Contact Information       Name Relation Home Work Mobile    [facility] Spouse [phone]   [phone]    Romona Curls Daughter     [phone]         Current Medical History  Patient Admitting Diagnosis: ICH, LVAD   History of Present Illness: 56 year old male with history of ICD, CAD< EF <20% for > 5 years, HLD and HTN. 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 Presented on 06/16/21 with worsening headache. CT showed intracerebral and intraventricular hemorrhage with obstructive hydrocephalus. INR 2.4.   Neurosurgery consulted and underwent bedside ventriculostomy. Repeat CT with some increased hemorrhage when ventric occluded therefore replaced. Eventually on 06/19/21 CT showed improved ventriculomegaly and drain discontinued. Coumadin restarted on 06/23/21 with pharmacy assisting with Coumadin management.    Chronic systolic CHF with HM II LVAD implant previously. New oxygen requirement with pulmonary edema and with gentle diuresis. Paroxysmal AFL with RVR. On low dose amio. Baseline creat 1.3- 1.7. will need to monitor if po not maintained and replace with IV fluids as needed. Losartan decreased due to bump in creat. Maps primarily 80s - low 90s.Continue statin .Off ASA due to history of GIB. Urinary retention and placed on Flomax. Foley placed on 06/23/21.   Head CT on 3/4 no change from prior. Patient continues to complain of headache. Coumadin began 3/1. INR goal 1.5 - 2.    Complete NIHSS TOTAL: 2   Patient's medical [facility]  has been reviewed by the rehabilitation admission coordinator and physician.   Past Medical History      Past Medical History:  Diagnosis Date   AICD (automatic  cardioverter/defibrillator) present     Anemia     Anxiety     CAD (coronary artery disease) 2009    AMI in 12/2007 with PCI to LAD, staged PCI to  mid/distal RCA, NSTEMI in 02/2009 with BMS to LCx   CHF (congestive heart failure) ([place])     Chronic kidney disease 11/03/2016    stage 3 kidney disease   Dysrhythmia      "arrythmia problems at some point", "fatal rhythms"   History of blood transfusion      "I was bleeding from was rectum" (04/19/2017)   HLD (hyperlipidemia)     HTN (hypertension)     Hyperlipidemia 03/15/2011   Ischemic cardiomyopathy      Admitted in 07/2010 with CHF exacerbation   Myocardial infarction ([place])      "I've had 4" (  04/19/2017)   Pneumonia 2018 X 1   Seizures ([place])      one seizure in 04/2016 during cardiac event (04/19/2017)   STEMI 2009 (anterior), 2010 (lateral), and 2012 (inferior) 03/14/2011    Has the patient had major surgery during 100 days prior to admission? No   Family History   family history includes Coronary artery disease in his brother, father, mother, and another family member; Diabetes in an other family member; Emphysema in his brother and mother; Hypertension in his daughter, father, mother, sister, and another family member; Obesity in his daughter.   Current Medications   Current Facility-Administered Medications:    0.9 %  sodium chloride infusion, , Intravenous, PRN, Clegg, Amy D, NP, Stopped at 06/19/21 1210   acetaminophen (TYLENOL) tablet 650 mg, 650 mg, Oral, Q4H PRN, Clegg, Amy D, NP, 650 mg at 06/27/21 2234   amiodarone (PACERONE) tablet 200 mg, 200 mg, Oral, Daily, Clegg, Amy D, NP, 200 mg at 06/28/21 0934   atorvastatin (LIPITOR) tablet 40 mg, 40 mg, Oral, Daily, Clegg, Amy D, NP, 40 mg at 06/28/21 0934   Chlorhexidine Gluconate Cloth 2 % PADS 6 each, 6 each, Topical, Daily, Bensimhon, Shaune Pascal, MD, 6 each at 06/27/21 1000   docusate sodium (COLACE) capsule 200 mg, 200 mg, Oral, Daily, Clegg, Amy D, NP, 200 mg at 06/28/21  0934   gabapentin (NEURONTIN) capsule 300 mg, 300 mg, Oral, BID, Clegg, Amy D, NP, 300 mg at 06/28/21 0934   hydrALAZINE (APRESOLINE) injection 10 mg, 10 mg, Intravenous, Q2H PRN, Clegg, Amy D, NP, 10 mg at 06/27/21 2359   ipratropium-albuterol (DUONEB) 0.5-2.5 (3) MG/3ML nebulizer solution 3 mL, 3 mL, Nebulization, Q4H PRN, Clegg, Amy D, NP   losartan (COZAAR) tablet 25 mg, 25 mg, Oral, Daily, Clegg, Amy D, NP, 25 mg at 06/28/21 0933   magnesium oxide (MAG-OX) tablet 400 mg, 400 mg, Oral, BID, Clegg, Amy D, NP, 400 mg at 06/28/21 0934   MEDLINE mouth rinse, 15 mL, Mouth Rinse, BID, Clegg, Amy D, NP, 15 mL at 06/28/21 0934   ondansetron (ZOFRAN) injection 4 mg, 4 mg, Intravenous, Q6H PRN, Clegg, Amy D, NP, 4 mg at 06/19/21 0534   oxyCODONE (Oxy IR/ROXICODONE) immediate release tablet 5-10 mg, 5-10 mg, Oral, Q12H PRN, Bensimhon, Shaune Pascal, MD, 5 mg at 06/27/21 2026   pantoprazole (PROTONIX) EC tablet 40 mg, 40 mg, Oral, Daily, Clegg, Amy D, NP, 40 mg at 06/28/21 0934   polyethylene glycol (MIRALAX / GLYCOLAX) packet 17 g, 17 g, Oral, Daily PRN, Clegg, Amy D, NP, 17 g at 06/08/21 1522   polyethylene glycol (MIRALAX / GLYCOLAX) packet 17 g, 17 g, Oral, Daily, Clegg, Amy D, NP, 17 g at 06/28/21 9702   polyvinyl alcohol (LIQUIFILM TEARS) 1.4 % ophthalmic solution 2 drop, 2 drop, Both Eyes, PRN, Bensimhon, Shaune Pascal, MD   senna (SENOKOT) tablet 17.2 mg, 2 tablet, Oral, QHS, Clegg, Amy D, NP, 17.2 mg at 06/27/21 2234   tamsulosin (FLOMAX) capsule 0.4 mg, 0.4 mg, Oral, Daily, Clegg, Amy D, NP, 0.4 mg at 06/28/21 0934   traMADol (ULTRAM) tablet 50 mg, 50 mg, Oral, Q12H PRN, Bensimhon, Shaune Pascal, MD, 50 mg at 06/28/21 0934   traZODone (DESYREL) tablet 50 mg, 50 mg, Oral, QHS, Clegg, Amy D, NP, 50 mg at 06/27/21 2234   warfarin (COUMADIN) tablet 2 mg, 2 mg, Oral, ONCE-1600, Einar Grad, [facility]   Warfarin - Pharmacist Dosing Inpatient, , Does not apply, q1600, Clegg, Amy D,  NP, Given at 06/27/21 2236    Patients Current Diet:  Diet Order                  Diet Heart Room service appropriate? Yes; Fluid consistency: Thin; Fluid restriction: 1800 mL Fluid  Diet effective now                       Precautions / Restrictions Precautions Precautions: Fall, Other (comment) Precaution Comments: LVAD (x4 year hx Restrictions Weight Bearing Restrictions: No    Has the patient had 2 or more falls or a fall with injury in the past year? No   Prior Activity Level Limited Community (1-2x/wk): I without AD   Prior Functional Level Self Care: Did the patient need help bathing, dressing, using the toilet or eating? Independent   Indoor Mobility: Did the patient need assistance with walking from room to room (with or without device)? Independent   Stairs: Did the patient need assistance with internal or external stairs (with or without device)? Independent   Functional Cognition: Did the patient need help planning regular tasks such as shopping or remembering to take medications? Independent   Patient Information Are you of Hispanic, Latino/a,or Spanish origin?: A. No, not of Hispanic, Latino/a, or Spanish origin What is your race?: B. Black or African American Do you need or want an interpreter to communicate with a doctor or health care staff?: 0. No   Patient's Response To:  Health Literacy and Transportation Is the patient able to respond to health literacy and transportation needs?: Yes Health Literacy - How often do you need to have someone help you when you read instructions, pamphlets, or other written material from your doctor or pharmacy?: Never In the past 12 months, has lack of transportation kept you from medical appointments or from getting medications?: No In the past 12 months, has lack of transportation kept you from meetings, work, or from getting things needed for daily living?: No   Development worker, international aid / [place] Devices/Equipment: None Home  Equipment: None   Prior Device Use: Indicate devices/aids used by the patient prior to current illness, exacerbation or injury? None of the above   Current Functional Level Cognition   Overall Cognitive Status: Impaired/Different from baseline Current Attention Level: Sustained, Selective Orientation Level: Oriented X4 Following Commands: Follows one step commands with increased time, Follows multi-step commands inconsistently Safety/Judgement: Decreased awareness of safety, Decreased awareness of deficits General Comments: Pt oriented to month/year, place, but not day of week. Poor short term memory recall    Extremity Assessment (includes Sensation/Coordination)   Upper Extremity Assessment: Overall WFL for tasks assessed  Lower Extremity Assessment: Defer to PT evaluation     ADLs   Overall ADL's : Needs assistance/impaired Eating/Feeding: Set up, Sitting Grooming: Min guard, Moderate assistance, Oral care, Standing Grooming Details (indicate cue type and reason): Pt requiring increased time to note items in L visual field. Min Guard A for safety at first. Pt then with lateral lean with fatigue - Mod A for support as pt leaning to left in standing Upper Body Bathing: Minimal assistance, Sitting Lower Body Bathing: Moderate assistance, Sit to/from stand Lower Body Bathing Details (indicate cue type and reason): While sitting at EOB, requesting pt perform peri care. pt instantly reaches wash clothe towards his wife to clean him. Cueing pt to perform and requiring increased encouragment. Pt with difficulty terminating task and requiring cues to stop Upper Body Dressing : Moderate assistance,  Sitting Upper Body Dressing Details (indicate cue type and reason): When switching from wall power to LVAD batteries, pt with poor problem solving, error recognition, sequencing requiring Mod-Max cues cues Lower Body Dressing: Minimal assistance, Sit to/from stand, Maximal assistance, Cueing for  sequencing Lower Body Dressing Details (indicate cue type and reason): Pt doning underwear while seated at EOB requiring Max A over all due to cognition. Requiring Max cues to recognition error in his attempt and then problem solve a solution to compensatory for his decreased ROM. Also requiring cues for taking several rest breaks. Min A for sitting balance while bending forward. Min A to bring underwear over L heal as pt continued to attempt to pull underwear forward and not back behind heel despite cues. Toilet Transfer: Minimal assistance, Event organiser Details (indicate cue type and reason): Min A for balance Tub/ Shower Transfer: Min guard, Ambulation Functional mobility during ADLs: Minimal assistance, Min guard, Moderate assistance General ADL Comments: Pt requiring Min Guard A for initial mobility but then as he fatigued, he required atleast Min A and at times Mod A. Leaning to L and shuffing gait. Pt switching from A/C power to batteries with Mod cues and Min hand over hand. Participating in two part instruction task and LUE exercises at sink.     Mobility   Overal bed mobility: Needs Assistance Bed Mobility: Supine to Sit Rolling: Supervision Supine to sit: Supervision Sit to supine: HOB elevated, Min assist General bed mobility comments: Sitting in recliner upon arrival     Transfers   Overall transfer level: Needs assistance Equipment used: Rollator (4 wheels) Transfers: Sit to/from Stand Sit to Stand: Min guard General transfer comment: Min Guard A for safety     Ambulation / Gait / Stairs / Psychologist, prison and probation services   Ambulation/Gait Ambulation/Gait assistance: Land (Feet): 300 Feet Assistive device: Rollator (4 wheels) Gait Pattern/deviations: Step-through pattern, Decreased stride length General Gait Details: Pt requiring min guard assist for safety, overall fairly slow and steady pace over level surfaces, min cues for environmental and obstacle  navigation Gait velocity: decreased Gait velocity interpretation: <1.31 ft/sec, indicative of household ambulator     Posture / Balance Dynamic Sitting Balance Sitting balance - Comments: Pt with improved midline alignment statically sitting EOB today, min guard for safety. Static Standing Balance Rhomberg - Eyes Opened: 5 (seconds, LOB modA to recover) Balance Overall balance assessment: Needs assistance Sitting-balance support: No upper extremity supported, Feet supported Sitting balance-Leahy Scale: Good Sitting balance - Comments: Pt with improved midline alignment statically sitting EOB today, min guard for safety. Postural control: Left lateral lean Standing balance support: No upper extremity supported, During functional activity Standing balance-Leahy Scale: Poor Standing balance comment: Initially fair balance and progressing to poor with fatigue. L lateral lean Rhomberg - Eyes Opened: 5 (seconds, LOB modA to recover) High level balance activites: Side stepping, Backward walking Standardized Balance Assessment Standardized Balance Assessment : Dynamic Gait Index Dynamic Gait Index Level Surface: Severe Impairment (without UE support, 2 with rollator) Change in Gait Speed: Moderate Impairment (without UE support, 1 with rollator) Gait with Horizontal Head Turns: Moderate Impairment (assumed without UE support, 2 with rollator) Gait with Vertical Head Turns: Moderate Impairment (assumed without UE support, 2 with rollator) Gait and Pivot Turn: Mild Impairment (without UE support, 2 with rollator) Step Over Obstacle: Severe Impairment (without UE support, 1 with rollator) Step Around Obstacles: Moderate Impairment (without UE support, 0 with rollator) Steps: Moderate Impairment (assumed) Total Score: 7  Special needs/care consideration LVAD HM-III for 5 years. Patient and wife trained    Previous Home Environment  Living Arrangements: Spouse/significant other Available  Help at Discharge: Family, Available 24 hours/day Type of Home: Apartment Home Layout: One level Home Access: Stairs to enter [employer] of Steps: 2 Bathroom Shower/Tub: Sponge bathes at baseline [employer]: Standard Bathroom Accessibility: Yes How Accessible: Accessible via walker [place]: No   Discharge Living Setting Plans for Discharge Living Setting: Patient's home, Lives with (comment), Apartment (wife) Type of Home at Discharge: Apartment Discharge Home Layout: One level Discharge Home Access: Stairs to enter Entrance Stairs-Number of Steps: 2 Discharge Bathroom Shower/Tub: Tub/shower unit (sponge bathes at baseline) Discharge Bathroom Toilet: Standard Discharge Bathroom Accessibility: Yes How Accessible: Accessible via walker Does the patient have any problems obtaining your medications?: No   Social/Family/Support Systems Patient Roles: Spouse Contact Information: wife, [name] Anticipated Caregiver: wife Anticipated Ambulance person Information: see contacts Ability/Limitations of Caregiver: none Caregiver Availability: 24/7 Discharge Plan Discussed with Primary Caregiver: Yes Is Caregiver In Agreement with Plan?: Yes Does Caregiver/Family have Issues with Lodging/Transportation while Pt is in Rehab?: Yes   Goals Patient/Family Goal for Rehab: Mod I to supervision with PT, OT and SLP Expected length of stay: ELOS 10 to 12 days Additional Information: LVAD for 5 years; wife is his trained partner Pt/Family Agrees to Admission and willing to participate: Yes Program Orientation Provided & Reviewed with Pt/Caregiver Including Roles  & Responsibilities: Yes   Decrease burden of Care through IP rehab admission: n/a   Possible need for SNF placement upon discharge: not anticipated   Patient Condition: I have reviewed medical records from [facility], spoken with CM, and patient, spouse, and family member. I met with patient at  the bedside for inpatient rehabilitation assessment.  Patient will benefit from ongoing PT, OT, and SLP, can actively participate in 3 hours of therapy a day 5 days of the week, and can make measurable gains during the admission.  Patient will also benefit from the coordinated team approach during an Inpatient Acute Rehabilitation admission.  The patient will receive intensive therapy as well as Rehabilitation physician, nursing, social worker, and care management interventions.  Due to bladder management, bowel management, safety, skin/wound care, disease management, medication administration, pain management, and patient education the patient requires 24 hour a day rehabilitation nursing.  The patient is currently min assist overall with mod cues with mobility and basic ADLs.  Discharge setting and therapy post discharge at home with home health is anticipated.  Patient has agreed to participate in the Acute Inpatient Rehabilitation Program and will admit today.   Preadmission Screen Completed By:  Cleatrice Burke, 06/28/2021 1:49 PM ______________________________________________________________________   Discussed status with Dr. Ranell Patrick on 06/28/2021 at 1349 and received approval for admission today.   Admission Coordinator:  Cleatrice Burke, RN, time  4709 Date 06/28/2021    Assessment/Plan: Diagnosis: Cardiac debility Does the need for close, 24 hr/day Medical supervision in concert with the patient's rehab needs make it unreasonable for this patient to be served in a less intensive setting? Yes Co-Morbidities requiring supervision/potential complications: overweight, chronic systolic heart failure, ischemic cardiomyopathy, LVAD, ICH Due to bladder management, bowel management, safety, skin/wound care, disease management, medication administration, pain management, and patient education, does the patient require 24 hr/day rehab nursing? Yes Does the patient require coordinated care of a  physician, rehab nurse, PT, OT, and SLP to address physical and functional deficits in the context of  the above medical diagnosis(es)? Yes Addressing deficits in the following areas: balance, endurance, locomotion, strength, transferring, bowel/bladder control, bathing, dressing, feeding, grooming, toileting, cognition, and psychosocial support Can the patient actively participate in an intensive therapy program of at least 3 hrs of therapy 5 days a week? Yes The potential for patient to make measurable gains while on inpatient rehab is excellent Anticipated functional outcomes upon discharge from inpatient rehab: supervision PT, supervision OT, supervision SLP Estimated rehab length of stay to reach the above functional goals is: 10-14 days Anticipated discharge destination: Home 10. Overall Rehab/Functional Prognosis: excellent     MD Signature: Leeroy Cha, MD         Revision History                                              Note Details  Author Izora Ribas, MD File Time 06/28/2021  2:06 PM  Author Type Physician Status Signed  Last Editor Izora Ribas, MD Service Physical Medicine and Rehabilitation

## 2021-06-28 NOTE — TOC Transition Note (Signed)
Transition of Care (TOC) - CM/SW Discharge Note ? ? ?Patient Details  ?Name: David [name] ?MRN: [id number] ?Date of Birth: 09-30-1965 ? ?Transition of Care (TOC) CM/SW Contact:  ?Mahealani Sulak, LCSW ?Phone Number: ?06/28/2021, 2:39 PM ? ? ?Clinical Narrative:    ?[name] to discharge to CIR today. ? ? ?Final next level of care: [facility] ?Barriers to Discharge: No SNF bed ? ? ?Patient Goals and CMS Choice ?Patient states their goals for this hospitalization and ongoing recovery are:: wife states she takes care of pt at home ?CMS Medicare.gov Compare Post Acute Care list provided to:: Patient Represenative (must comment) ?Choice offered to / list presented to : Spouse ? ?Discharge Placement ?  ?           ?  ?  ?  ?  ? ?Discharge Plan and Services ?  ?Discharge Planning Services: CM Consult ?Post Acute Care Choice: Home Health          ?DME Arranged: 3-N-1, Walker rolling with seat ?DME Agency: [employer] ?Date DME Agency Contacted: 06/22/21 ?Time DME Agency Contacted: 0962 ?Representative spoke with at DME Agency: Melina Modena ?HH Arranged: RN, PT, OT ?[place] Agency: [place] ?Date HH Agency Contacted: 06/22/21 ?Time [place]: 8366 ?Representative spoke with at [place]: Freddi Starr ? ?Social Determinants of Health (SDOH) Interventions ?  ? ? ?Readmission Risk Interventions ?No flowsheet data found. ? ? ?[place], MSW, LCSW ?[phone] ?Heart Failure Social Worker  ? ?

## 2021-06-28 NOTE — Progress Notes (Signed)
Reported given to IP Rehab RN for [employer] - belongings packed up and patient to be transported by RN to new room 4MW07.  ?

## 2021-06-29 ENCOUNTER — Other Ambulatory Visit: Payer: Self-pay

## 2021-06-29 DIAGNOSIS — Z95811 Presence of heart assist device: Secondary | ICD-10-CM

## 2021-06-29 DIAGNOSIS — I5022 Chronic systolic (congestive) heart failure: Secondary | ICD-10-CM

## 2021-06-29 DIAGNOSIS — I612 Nontraumatic intracerebral hemorrhage in hemisphere, unspecified: Secondary | ICD-10-CM

## 2021-06-29 DIAGNOSIS — I618 Other nontraumatic intracerebral hemorrhage: Secondary | ICD-10-CM

## 2021-06-29 LAB — LACTATE DEHYDROGENASE: LDH: 172 U/L (ref 98–192)

## 2021-06-29 LAB — PROTIME-INR
INR: 1.5 — ABNORMAL HIGH (ref 0.8–1.2)
Prothrombin Time: 18 seconds — ABNORMAL HIGH (ref 11.4–15.2)

## 2021-06-29 LAB — MAGNESIUM: Magnesium: 1.6 mg/dL — ABNORMAL LOW (ref 1.7–2.4)

## 2021-06-29 MED ORDER — ADULT MULTIVITAMIN W/MINERALS CH
1.0000 | ORAL_TABLET | Freq: Every day | ORAL | Status: DC
  Administered 2021-06-29 – 2021-07-06 (×8): 1 via ORAL
  Filled 2021-06-29 (×8): qty 1

## 2021-06-29 MED ORDER — MAGNESIUM SULFATE 4 GM/100ML IV SOLN
4.0000 g | Freq: Once | INTRAVENOUS | Status: AC
  Administered 2021-06-29: 4 g via INTRAVENOUS
  Filled 2021-06-29: qty 100

## 2021-06-29 MED ORDER — BOOST / RESOURCE BREEZE PO LIQD CUSTOM
1.0000 | Freq: Three times a day (TID) | ORAL | Status: DC
  Administered 2021-06-29 – 2021-07-05 (×16): 1 via ORAL

## 2021-06-29 MED ORDER — WARFARIN SODIUM 1 MG PO TABS
1.5000 mg | ORAL_TABLET | Freq: Once | ORAL | Status: AC
  Administered 2021-06-29: 1.5 mg via ORAL
  Filled 2021-06-29: qty 1

## 2021-06-29 NOTE — Evaluation (Addendum)
Speech Language Pathology Assessment and Plan  Patient Details  Name: David [name] MRN: [id number] Date of Birth: 01-19-1966  SLP Diagnosis: Cognitive Impairments  Rehab Potential: Good ELOS: 7-10 days   Today's Date: 06/29/2021 SLP Individual Time: 35-1445 SLP Individual Time Calculation (min): 73 min  Hospital Problem: Principal Problem:   ICH (intracerebral hemorrhage) ([place])  Past Medical History:  Past Medical History:  Diagnosis Date   AICD (automatic cardioverter/defibrillator) present    Anemia    Anxiety    CAD (coronary artery disease) 2009   AMI in 12/2007 with PCI to LAD, staged PCI to  mid/distal RCA, NSTEMI in 02/2009 with BMS to LCx   CHF (congestive heart failure) ([place])    Chronic kidney disease 11/03/2016   stage 3 kidney disease   Dysrhythmia    "arrythmia problems at some point", "fatal rhythms"   History of blood transfusion    "I was bleeding from was rectum" (04/19/2017)   HLD (hyperlipidemia)    HTN (hypertension)    Hyperlipidemia 03/15/2011   Ischemic cardiomyopathy    Admitted in 07/2010 with CHF exacerbation   Myocardial infarction ([place])    "I've had 4" (04/19/2017)   Pneumonia 2018 X 1   Seizures ([place])    one seizure in 04/2016 during cardiac event (04/19/2017)   STEMI 2009 (anterior), 2010 (lateral), and 2012 (inferior) 03/14/2011   Past Surgical History:  Past Surgical History:  Procedure Laterality Date   AORTIC VALVE REPAIR N/A 04/20/2017   Procedure: AORTIC VALVE REPAIR;  Surgeon: Gaye Pollack, MD;  Location: [facility];  Service: Open Heart Surgery;  Laterality: N/A;   BIV ICD INSERTION CRT-D N/A 08/01/2016   Procedure: BiV ICD Insertion CRT-D;  Surgeon: Will Meredith Leeds, MD;  Location: [facility];  Service: Cardiovascular;  Laterality: N/A;   CARDIAC CATHETERIZATION N/A 05/05/2016   Procedure: Right/Left Heart Cath and Coronary Angiography;  Surgeon: Jolaine Artist, MD;  Location: [facility];  Service:  Cardiovascular;  Laterality: N/A;   CARDIAC CATHETERIZATION N/A 05/09/2016   Procedure: Coronary Stent Intervention;  Surgeon: Peter M Martinique, MD;  Location: [facility];  Service: Cardiovascular;  Laterality: N/A;   CARDIAC CATHETERIZATION N/A 05/09/2016   Procedure: Intravascular Pressure Wire/FFR Study;  Surgeon: Peter M Martinique, MD;  Location: [facility];  Service: Cardiovascular;  Laterality: N/A;   CARDIOVERSION N/A 10/17/2018   Procedure: CARDIOVERSION;  Surgeon: Jolaine Artist, MD;  Location: [facility];  Service: Cardiovascular;  Laterality: N/A;   CORONARY ANGIOPLASTY WITH STENT PLACEMENT     "i've got a total of 12 stents" (04/19/2017)   ELECTROPHYSIOLOGY STUDY N/A 06/24/2016   Procedure: Electrophysiology Study;  Surgeon: Deboraha Sprang, MD;  Location: [facility];  Service: Cardiovascular;  Laterality: N/A;   ENTEROSCOPY N/A 06/03/2017   Procedure: ENTEROSCOPY;  Surgeon: Yetta Flock, MD;  Location: [facility] ENDOSCOPY;  Service: Gastroenterology;  Laterality: N/A;   EPICARDIAL PACING LEAD PLACEMENT Left 11/07/2016   Procedure: LV EPICARDIAL PACING LEAD PLACEMENT VIA LEFT MINI THORACOTOMY;  Surgeon: Gaye Pollack, MD;  Location: [facility];  Service: Thoracic;  Laterality: Left;   ICD IMPLANT N/A 06/24/2016   Procedure: POSSIBLE  ICD Implant;  Surgeon: Deboraha Sprang, MD;  Location: [facility] [facility];  Service: Cardiovascular;  Laterality: N/A;   INSERTION OF IMPLANTABLE LEFT VENTRICULAR ASSIST DEVICE N/A 04/20/2017   Procedure: INSERTION OF IMPLANTABLE LEFT VENTRICULAR ASSIST DEVICE, Aortic Valve repair;  Surgeon: Gaye Pollack,  MD;  Location: [facility];  Service: Open Heart Surgery;  Laterality: N/A;  Heartmate 3   IR FLUORO GUIDE CV LINE RIGHT  05/08/2017   IR FLUORO GUIDE CV MIDLINE PICC RIGHT  03/14/2017   IR REMOVAL TUN CV CATH W/O FL  05/25/2017   IR US GUIDE VASC ACCESS RIGHT  03/14/2017   IR US GUIDE VASC ACCESS RIGHT  05/08/2017   LEFT HEART CATHETERIZATION  WITH CORONARY ANGIOGRAM N/A 03/13/2011   Procedure: LEFT HEART CATHETERIZATION WITH CORONARY ANGIOGRAM;  Surgeon: Lorretta Harp, MD;  Location: [facility] C[facility] LAB;  Service: Cardiovascular;  Laterality: N/A;   MULTIPLE EXTRACTIONS WITH ALVEOLOPLASTY N/A 04/12/2017   Procedure: Extraction of tooth #'s 2, 5-12, 17, 20-29 with alveoloplasty amd maxillary right buccal exostoses reductions.;  Surgeon: Lenn Cal, DDS;  Location: [facility];  Service: Oral Surgery;  Laterality: N/A;   RIGHT HEART CATH N/A 04/19/2017   Procedure: RIGHT HEART CATH;  Surgeon: Jolaine Artist, MD;  Location: [facility];  Service: Cardiovascular;  Laterality: N/A;   RIGHT/LEFT HEART CATH AND CORONARY ANGIOGRAPHY N/A 03/10/2017   Procedure: RIGHT/LEFT HEART CATH AND CORONARY ANGIOGRAPHY;  Surgeon: Jolaine Artist, MD;  Location: [facility];  Service: Cardiovascular;  Laterality: N/A;   TEE WITHOUT CARDIOVERSION N/A 04/20/2017   Procedure: TRANSESOPHAGEAL ECHOCARDIOGRAM (TEE);  Surgeon: Gaye Pollack, MD;  Location: [facility];  Service: Open Heart Surgery;  Laterality: N/A;   TEE WITHOUT CARDIOVERSION N/A 10/17/2018   Procedure: TRANSESOPHAGEAL ECHOCARDIOGRAM (TEE);  Surgeon: Jolaine Artist, MD;  Location: [facility] ENDOSCOPY;  Service: Cardiovascular;  Laterality: N/A;    Assessment / Plan / Recommendation Clinical Impression David [name] is a 56 year old male with history of CAD s/p ICD,  LVAD HM III12/19, gastric AVMs, LBP who  was admitted on 06/05/21 with reports of neck pain with worsening HA progressing to somnolence. He was found to have acute hemorrhage in right caudothalamic with intraventricular clot in right lateral ventricle to 4th ventricle with obstructive changes and asymmetric ballooning of right sided horns. INR 2.4. Coumadin reversed with Kcentra and EVD placed by Dr. Arnoldo Morale with improvement in HA and mentation.  Serial CT head showed resolution of hydrocephalus but increase in bleed as  well as occlusion of ventriculostomy drain which was replaced on 02/18. He has had issues with intermittent confusion, hypoxia due to fluid overload requiring supplemental oxygen as well as hypotension with acute on chronic renal failure. He had worsening of mental status with confusion and restlessness on 03/04. CT head repeated showing no significant change in size of large IVH with 0.9 cm left to right midline shift. He was found to have limitations due to HA, cognitive deficits as well as debility. CIR was recommended due to functional decline. His wife is already well trained with LVAD.  Patient demonstrates cognitive impairments characterized by decreased functional problem solving, recall of novel information, planning/organizing, intellectual awareness which impacts his safety with functional and familiar tasks. Patient denied new physical or cognitive changes s/p CVA, however acknowledged possible changes as identified by PT/OT and spouse, including LLE weakness, occasional confusion, and decreased memory. SLP administered the [facility] Mental Status Examination (SLUMS) and patient scored 16/30 points with a score of 27 or above considered normal. Clock drawing was suggestive of left visual field deficits. Patient's overall auditory comprehension and verbal expression appeared [facility] for all tasks assessed and patient was 100% intelligible at the conversation level. Patient would benefit from skilled  SLP intervention to maximize cognitive functioning and overall functional independence prior to discharge.    Skilled Therapeutic Interventions          Pt participated in [place] Status Examination (SLUMS) as well as further non-standardized assessments of cognitive-linguistic, speech, and language function. Please see above.     SLP Assessment  Patient will need skilled [facility] during CIR admission    Recommendations  Patient destination:  Home Follow up Recommendations: Other (comment) (TBD) Equipment Recommended: None recommended by SLP    SLP Frequency 3 to 5 out of 7 days   SLP Duration  SLP Intensity  SLP Treatment/Interventions 7-10 days  Minumum of 1-2 x/day, 30 to 90 minutes  Cognitive remediation/compensation;Internal/external aids;Cueing hierarchy;Functional tasks;Patient/family education;Therapeutic Activities    Pain Pain Assessment Pain Scale: 0-10 Pain Score: 6  Faces Pain Scale: Hurts even more Pain Type: Acute pain Pain Location: Head Pain Orientation: Right;Left Pain Descriptors / Indicators: Aching Pain Frequency: Constant Pain Onset: On-going Pain Intervention(s): Medication (See eMAR)  Prior Functioning Cognitive/Linguistic Baseline: Information not available Type of Home: Apartment  Lives With: Spouse Available Help at Discharge: Family;Available 24 hours/day Education: Highschool Vocation: On disability  SLP Evaluation Cognition Overall Cognitive Status: Impaired/Different from baseline Arousal/Alertness: Awake/alert Orientation Level: Oriented X4 Year: 2023 Month: March Day of Week: Incorrect Memory: Impaired Memory Impairment: Decreased recall of new information;Retrieval deficit Awareness: Impaired Awareness Impairment: Intellectual impairment Problem Solving: Impaired Problem Solving Impairment: Verbal complex Executive Function: Organizing;Self Correcting Organizing: Impaired Organizing Impairment: Functional complex Self Correcting: Impaired Self Correcting Impairment: Functional complex Safety/Judgment: Impaired  Comprehension Auditory Comprehension Overall Auditory Comprehension: Appears within functional limits for tasks assessed Expression Expression Primary Mode of Expression: Verbal Verbal Expression Overall Verbal Expression: Appears within functional limits for tasks assessed Written Expression Dominant Hand: Right Oral Motor Oral Motor/Sensory  Function Overall Oral Motor/Sensory Function: Within functional limits Motor Speech Overall Motor Speech: Appears within functional limits for tasks assessed Intelligibility: Intelligible  Care Tool Care Tool Cognition Ability to hear (with hearing aid or hearing appliances if normally used Ability to hear (with hearing aid or hearing appliances if normally used): 0. Adequate - no difficulty in normal conservation, social interaction, listening to TV   Expression of Ideas and Wants Expression of Ideas and Wants: 4. Without difficulty (complex and basic) - expresses complex messages without difficulty and with speech that is clear and easy to understand   Understanding Verbal and Non-Verbal Content Understanding Verbal and Non-Verbal Content: 3. Usually understands - understands most conversations, but misses some part/intent of message. Requires cues at times to understand  Memory/Recall Ability Memory/Recall Ability : Current season;That he or she is in a hospital/hospital unit   Intelligibility: Intelligible  Short Term Goals: Week 1: SLP Short Term Goal 1 (Week 1): STG=LTG due to ELOS  Refer to Care Plan for Long Term Goals  Recommendations for other services: None   Discharge Criteria: Patient will be discharged from SLP if patient refuses treatment 3 consecutive times without medical reason, if treatment goals not met, if there is a change in medical status, if patient makes no progress towards goals or if patient is discharged from hospital.  The above assessment, treatment plan, treatment alternatives and goals were discussed and mutually agreed upon: by patient  Patty Sermons 06/29/2021, 2:51 PM

## 2021-06-29 NOTE — Progress Notes (Signed)
Patient ID: David [name], male   DOB: 1966/03/03, 56 y.o.   MRN: [id number] ?Met with patient to review rehab process, team conference, care coordinator role and plan of care. Reviewed situation, secondary risks, dietary modifications and medications. Patient noted headache unrelieved with tylenol/tramadol. Foley out this morning; has not voided since but feels he could urinate soon. Continue to follow along to discharge to address educational needs to facilitate preparation for discharge. David [name] ? ?

## 2021-06-29 NOTE — Progress Notes (Signed)
?                                                       PROGRESS NOTE ? ? ?Subjective/Complaints: ? ?No issues overnite  ? ?ROS- neg CP, SOB, N/V/D ? ?Objective: ?  ?No results found. ?Recent Labs  ?  06/27/21 ?0093 06/28/21 ?8182  ?WBC 7.3 6.8  ?HGB 12.1* 12.3*  ?HCT 37.9* 38.0*  ?PLT 193 183  ? ?Recent Labs  ?  06/27/21 ?9937 06/28/21 ?1696  ?NA 135 134*  ?K 3.8 3.7  ?CL 102 103  ?CO2 26 24  ?GLUCOSE 79 91  ?BUN 11 11  ?CREATININE 1.24 1.32*  ?CALCIUM 8.7* 8.4*  ? ? ?Intake/Output Summary (Last 24 hours) at 06/29/2021 0749 ?Last data filed at 06/29/2021 0600 ?Gross per 24 hour  ?Intake --  ?Output 825 ml  ?Net -825 ml  ?  ? ?  ? ?Physical Exam: ?Vital Signs ?Blood pressure 101/89, pulse 75, temperature 98.7 ?F (37.1 ?C), temperature source Oral, resp. rate 16, height 5\' 5"  (1.651 m), weight 80.5 kg, SpO2 96 %. ? ? ? ?Assessment/Plan: ?1. Functional deficits which require 3+ hours per day of interdisciplinary therapy in a comprehensive inpatient rehab setting. ?Physiatrist is providing close team supervision and 24 hour management of active medical problems listed below. ?Physiatrist and rehab team continue to assess barriers to discharge/monitor patient progress toward functional and medical goals ? ?Care Tool: ? ?Bathing ?   ?   ?   ?  ?  ?Bathing assist   ?  ?  ?Upper Body Dressing/Undressing ?Upper body dressing   ?  ?   ?Upper body assist   ?   ?Lower Body Dressing/Undressing ?Lower body dressing ? ? ?   ?  ? ?  ? ?Lower body assist   ?   ? ?Toileting ?Toileting    ?Toileting assist   ?  ?  ?Transfers ?Chair/bed transfer ? ?Transfers assist ?   ? ?  ?  ?  ?Locomotion ?Ambulation ? ? ?Ambulation assist ? ?   ? ?  ?  ?   ? ?Walk 10 feet activity ? ? ?Assist ?   ? ?  ?   ? ?Walk 50 feet activity ? ? ?Assist   ? ?  ?   ? ? ?Walk 150 feet activity ? ? ?Assist   ? ?  ?  ?  ? ?Walk 10 feet on uneven surface  ?activity ? ? ?Assist   ? ? ?  ?   ? ?Wheelchair ? ? ? ? ?Assist   ?  ?  ? ?  ?   ? ? ?Wheelchair 50 feet with 2  turns activity ? ? ? ?Assist ? ?  ?  ? ? ?   ? ?Wheelchair 150 feet activity  ? ? ? ?Assist ?   ? ? ?   ? ?Blood pressure 101/89, pulse 75, temperature 98.7 ?F (37.1 ?C), temperature source Oral, resp. rate 16, height 5\' 5"  (1.651 m), weight 80.5 kg, SpO2 96 %. ? ?Medical Problem List and Plan: ?1. 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            -patient may shower ?            -ELOS/Goals: S 10-14 days ?  CIR evals  ?2.  Antithrombotics: ?-DVT/anticoagulation:  Pharmaceutical: Coumadin resumed 03/01.  ?            -antiplatelet therapy: N/A--off ASA due to AVM/GIB ?3. Headaches/Pain Management:  HA since bleed ?            --oxycodone effective and being tapered to bid with ultram additionally prn. Increase  topamax 25mg  BID ?            --gabapentin for neuropathic chest pain will d/c given trial of topirimate ?            --back pain since injury a few months ago  ?4. Mood: LCSW to follow for evaluation and support.  ?            -antipsychotic agents: N/A ?5. Neuropsych: This patient is capable of making decisions on her own behalf. ?6. Skin/Wound Care: scalp incision CDI healing well ?7. Fluids/Electrolytes/Nutrition: Strict I/O. ?-daily wts. Monitor for signs of overload.  ?8. CAD/ICM s/p LVAD/ICD: LVAD checks daily per protocol ?            --continue Cozaar, Lipitor and amiodarone. - as per CHF team  ?Vitals:  ? 06/28/21 1944 06/29/21 0439  ?BP: (!) 106/93 101/89  ?Pulse: 85 75  ?Resp: 16 16  ?Temp: 98.3 ?F (36.8 ?C) 98.7 ?F (37.1 ?C)  ?SpO2: 96% 96%  ? ? ?9. H/o A fib: Monitor HR TID--continue amiodarone daily. Rate controlled  ?10. Urinary retention: Continue Flomax  ?--+BM 03/05-->d/c foley and monitor voiding function  ?11. Constipation: On Senna and miralax--augment as needed ?  ? ?LOS: ?1 days ?A FACE TO FACE EVALUATION WAS PERFORMED ? ?David [name] ?06/29/2021, 7:49 AM  ? ? ? ?

## 2021-06-29 NOTE — Plan of Care (Signed)
?  Problem: RH Problem Solving ?Goal: LTG Patient will demonstrate problem solving for (SLP) ?Description: LTG:  Patient will demonstrate problem solving for basic/complex daily situations with cues  (SLP) ?Flowsheets (Taken 06/29/2021 1451) ?LTG: Patient will demonstrate problem solving for (SLP): Complex daily situations ?LTG Patient will demonstrate problem solving for: Supervision ?  ?Problem: RH Memory ?Goal: LTG Patient will use memory compensatory aids to (SLP) ?Description: LTG:  Patient will use memory compensatory aids to recall biographical/new, daily complex information with cues (SLP) ?Flowsheets (Taken 06/29/2021 1451) ?LTG: Patient will use memory compensatory aids to (SLP): Supervision ?  ?Problem: RH Awareness ?Goal: LTG: Patient will demonstrate awareness during functional activites type of (SLP) ?Description: LTG: Patient will demonstrate awareness during functional activites type of (SLP) ?Flowsheets (Taken 06/29/2021 1452) ?Patient will demonstrate during cognitive/linguistic activities awareness type of: Emergent ?LTG: Patient will demonstrate awareness during cognitive/linguistic activities with assistance of (SLP): Supervision ?  ?Problem: RH Memory ?Goal: LTG Patient will demonstrate ability for day to day (SLP) ?Description: LTG:   Patient will demonstrate ability for day to day recall/carryover during cognitive/linguistic activities with assist  (SLP) ?Flowsheets (Taken 06/29/2021 1452) ?LTG: Patient will demonstrate ability for day to day recall: New information ?LTG: Patient will demonstrate ability for day to day recall/carryover during cognitive/linguistic activities with assist (SLP): Supervision ?  ?

## 2021-06-29 NOTE — Progress Notes (Signed)
Inpatient Rehabilitation  Patient information reviewed and entered into eRehab system by Yexalen Deike M. Breaker Springer, M.A., CCC/SLP, PPS Coordinator.  Information including medical coding, functional ability and quality indicators will be reviewed and updated through discharge.    

## 2021-06-29 NOTE — Plan of Care (Signed)
?  Problem: RH PAIN MANAGEMENT ?Goal: RH STG PAIN MANAGED AT OR BELOW PT'S PAIN GOAL ?Description: At or below level 4 with prns ?Outcome: Not Progressing; constant headache; due prn meds given ; Pam PA made aware. ?  ?

## 2021-06-29 NOTE — Progress Notes (Signed)
Initial Nutrition Assessment ? ?DOCUMENTATION CODES:  ? ?Not applicable ? ?INTERVENTION:  ?- Encourage adequate PO intake ?- Liberalize diet to a 2g Na diet to provide more menu options to enhance nutritional adequacy ?- Boost Breeze po TID, each supplement provides 250 kcal and 9 grams of protein ?- MVI with minerals daily ? ?NUTRITION DIAGNOSIS:  ? ?Increased nutrient needs related to acute illness as evidenced by estimated needs. ? ?GOAL:  ? ?Patient will meet greater than or equal to 90% of their needs ? ?MONITOR:  ? ?PO intake, Supplement acceptance, Diet advancement, Labs, Weight trends ? ?REASON FOR ASSESSMENT:  ? ?Consult ?Assessment of nutrition requirement/status ? ?ASSESSMENT:  ? ?Pt admitted to CIR with functional decline d/t ICH. PMH includes CAD s/p ICD, LVAD HM III, gastric AVMs, LBP. ? ?Pt resting in bed during visit. Reports a fair appetite. He states that he is eating >/=75% of each meal provided but states that he does not enjoy the taste of hospital foods. He did not provide history of home PO intake. Is familiar with Boost and would like to receive Boost Breeze during admission. ? ?Meal completions: ?3/7: 50-100% x3 meals ? ?Pt reports a usual weight of 171 lbs and denies recent weight loss. Pt's current went noted to be 177 lbs. He has had a 3% weight loss since January. Will continue to monitor throughout admission. ? ?Medications: MAG-OX, MVI, protonix, miralax, senna, warfarin ? ?Labs: sodium 134, Cr 1.32, Mg 1.6 (replacing) ? ?Medications: MAG-OX, protonix, miralax, senna, warfarin ? ?Labs: sodium 134, Cr 1.32, Mg 1.6 (replacing) ? ?NUTRITION - FOCUSED PHYSICAL EXAM: ? ?Flowsheet Row Most Recent Value  ?Orbital Region No depletion  ?Upper Arm Region No depletion  ?Thoracic and Lumbar Region No depletion  ?Buccal Region Mild depletion  ?Temple Region No depletion  ?Clavicle Bone Region No depletion  ?Clavicle and Acromion Bone Region Mild depletion  ?Scapular Bone Region No depletion   ?Dorsal Hand No depletion  ?Patellar Region No depletion  ?Anterior Thigh Region No depletion  ?Posterior Calf Region Mild depletion  ?Edema (RD Assessment) None  ?Hair Reviewed  ?Eyes Other (Comment)  [R upper lid red-pt reports itching]  ?Mouth Reviewed  ?Skin Reviewed  ?Nails Reviewed  ? ?  ? ? ?Diet Order:   ?Diet Order   ? ?       ?  Diet Heart Room service appropriate? Yes; Fluid consistency: Thin; Fluid restriction: 1800 mL Fluid  Diet effective now       ?  ? ?  ?  ? ?  ? ? ?EDUCATION NEEDS:  ? ?Education needs have been addressed ? ?Skin:  Skin Assessment: Reviewed RN Assessment ? ?Last BM:  3/7 ? ?Height:  ? ?Ht Readings from Last 1 Encounters:  ?06/28/21 5\' 5"  (1.651 m)  ? ? ?Weight:  ? ?Wt Readings from Last 1 Encounters:  ?06/29/21 80.5 kg  ? ?BMI:  Body mass index is 29.53 kg/m?. ? ?Estimated Nutritional Needs:  ? ?Kcal:  2000-2200 ? ?Protein:  100-115g ? ?Fluid:  >/=2L ? ?David [name], RDN, LDN ?Clinical Nutrition ?

## 2021-06-29 NOTE — Progress Notes (Signed)
ANTICOAGULATION CONSULT NOTE ? ?Pharmacy Consult for warfarin ?Indication:  LVAD ? ?Allergies  ?Allergen Reactions  ? Plavix [Clopidogrel Bisulfate] Hives  ? ? ?Patient Measurements: ?Height: 5\' 5"  (165.1 cm) ?Weight: 80.5 kg (177 lb 7.5 oz) ?IBW/kg (Calculated) : 61.5 ? ?Vital Signs: ?Temp: 97.8 ?F (36.6 ?C) (03/07 1610) ?Temp Source: Oral (03/07 9604) ?BP: 108/83 (03/07 5409) ?Pulse Rate: 95 (03/07 0903) ? ?Labs: ?Recent Labs  ?  06/27/21 ?8119 06/28/21 ?1478 06/29/21 ?2956  ?HGB 12.1* 12.3*  --   ?HCT 37.9* 38.0*  --   ?PLT 193 183  --   ?LABPROT 16.3* 15.6* 18.0*  ?INR 1.3* 1.2 1.5*  ?CREATININE 1.24 1.32*  --   ? ? ? ?Estimated Creatinine Clearance: 60.3 mL/min (A) (by C-G formula based on SCr of 1.32 mg/dL (H)). ? ? ?Medical History: ?Past Medical History:  ?Diagnosis Date  ? AICD (automatic cardioverter/defibrillator) present   ? Anemia   ? Anxiety   ? CAD (coronary artery disease) 2009  ? AMI in 12/2007 with PCI to LAD, staged PCI to  mid/distal RCA, NSTEMI in 02/2009 with BMS to LCx  ? CHF (congestive heart failure) ([place])   ? Chronic kidney disease 11/03/2016  ? stage 3 kidney disease  ? Dysrhythmia   ? "arrythmia problems at some point", "fatal rhythms"  ? History of blood transfusion   ? "I was bleeding from was rectum" (04/19/2017)  ? HLD (hyperlipidemia)   ? HTN (hypertension)   ? Hyperlipidemia 03/15/2011  ? Ischemic cardiomyopathy   ? Admitted in 07/2010 with CHF exacerbation  ? Myocardial infarction [facility])   ? "I've had 4" (04/19/2017)  ? Pneumonia 2018 X 1  ? Seizures ([place])   ? one seizure in 04/2016 during cardiac event (04/19/2017)  ? STEMI 2009 (anterior), 2010 (lateral), and 2012 (inferior) 03/14/2011  ? ? ?Assessment: ?20 yoM on warfarin PTA for hx HM3 LVAD. Pt admitted 2/11 with ICH requiring ventriculostomy. INR 2.4 on admit reversed with vitamin K and 4FPCC. Ventriculostomy drain removed 2/25. ? ?Warfarin ordered to resume 2/27 but not given until 3/1, head CT stable. INR now therapeutic at 1.5  today. ? ?*Home warfarin dose 4mg  Fri, 2mg  AODs ? ?Goal of Therapy:  ?INR 1.5-2.0 (given ICH on warfarin within therapeutic range) ?Monitor platelets by anticoagulation protocol: Yes ?  ?Plan:  ?-Warfarin 1.5mg  PO x1 ?-Daily INR ? ?Arrie Senate, PharmD, BCPS, BCCP ?Clinical Pharmacist ?[phone] ?Please check AMION for all [place] numbers ?06/29/2021 ? ? ? ? ?

## 2021-06-29 NOTE — Progress Notes (Incomplete)
Physical Therapy Session Note  Patient Details  Name: David [name] MRN: [id number] Date of Birth: [birth year]  Today's Date: 06/29/2021 PT Individual Time: 0905-1010 PT Individual Time Calculation (min): 65 min   Short Term Goals: {[id number]}  Skilled Therapeutic Interventions/Progress Updates:      Therapy Documentation Precautions:  Precautions Precaution Comments: LVAD (x4 year hx Restrictions Weight Bearing Restrictions: No General:   Vital Signs:  Pain: Pain Assessment Pain Scale: 0-10 Pain Score: 5  Faces Pain Scale: Hurts even more Pain Type: Acute pain Pain Location: Head Pain Orientation: Right;Left Mobility:   Locomotion :    Trunk/Postural Assessment :    Balance:   Exercises:   Other Treatments:      Therapy/Group: {Therapy/Group:[id number]}  Alger Simons 06/29/2021, 5:49 PM

## 2021-06-29 NOTE — Progress Notes (Signed)
Inpatient Rehabilitation Admission Medication Review by a Pharmacist ? ?A complete drug regimen review was completed for this patient to identify any potential clinically significant medication issues. ? ?High Risk Drug Classes Is patient taking? Indication by Medication  ?Antipsychotic Yes Compazine prn N/V  ?Anticoagulant Yes Warfarin for LVAD  ?Antibiotic No   ?Opioid Yes Prn tramadol for pain  ?Antiplatelet No   ?Hypoglycemics/insulin No   ?Vasoactive Medication Yes Amiodarone for Afib, Losartan for BP/CHF  ?Chemotherapy No   ?Other Yes Atorvastatin for HLD ?Gabapentin for pain ?Topamax for HAs/seizures ?Trazodone for sleep ?Tamulosin for BPH ?Protonix for Gerd/bleed  ? ? ? ?Type of Medication Issue Identified Description of Issue Recommendation(s)  ?Drug Interaction(s) (clinically significant) ?    ?Duplicate Therapy ?    ?Allergy ?    ?No Medication Administration End Date ?    ?Incorrect Dose ?    ?Additional Drug Therapy Needed ?    ?Significant med changes from prior encounter (inform family/care partners about these prior to discharge).    ?Other ?    ? ? ?Clinically significant medication issues were identified that warrant physician communication and completion of prescribed/recommended actions by midnight of the next day:  No ? ?Pharmacist comments: None ? ?Time spent performing this drug regimen review (minutes):  20 minutes ? ? ?Tad Moore ?06/29/2021 8:13 AM ?

## 2021-06-29 NOTE — Progress Notes (Addendum)
Advanced Heart Failure VAD Team Note  PCP-Cardiologist: Glori Bickers, MD   Subjective:    2/11 Presented with [employer] 2/11 Underwent bedside ventriculostomy wit NSU 2/12 Repeat head CT with resolution of hydrocephalus. Persistent hematoma 2/17 Repeat CT head with increased hemorrhage in right lateral ventricle, slightly increased ventricular dilation.  The ventriculostomy drain is occluded.  2/18 Ventriculostomy drain replaced 2/25 CT improved ventriculomegaly. Drain pulled 2/27 F/u CT w/ stable intraparenchymal hemorrhage. Unchanged from prior.    3/1 Coumadin restarted  3/4 Confusion. Head CT no change from prior 3/6 Discharged to CIR    INR 1.5 today. Continues w/ mild chronic HA.   Wt stable. MAPs upper 70s.   Mg low 1.6. Received 4 gm MgSO4 supp this morning.   Tolerating therapy ok. Denies exertional dyspnea. No dizziness. Appetite is good.   LVAD INTERROGATION:  HeartMate III LVAD:   Flow 5.1 liters/min, speed 5450, power 4.1, PI 2.6.  No PI events   Objective:    Vital Signs:   Temp:  [97.6 F (36.4 C)-98.7 F (37.1 C)] 97.8 F (36.6 C) (03/07 1324) Pulse Rate:  [75-95] 84 (03/07 1324) Resp:  [14-16] 16 (03/07 1324) BP: (68-108)/(54-93) 81/54 (03/07 1339) SpO2:  [94 %-100 %] 94 % (03/07 1324) Weight:  [80.5 kg-81.4 kg] 80.5 kg (03/07 0500) Last BM Date : 06/29/21 Mean arterial Pressure 79   Intake/Output:   Intake/Output Summary (Last 24 hours) at 06/29/2021 1424 Last data filed at 06/29/2021 1300 Gross per 24 hour  Intake 480 ml  Output 825 ml  Net -345 ml     Physical Exam    General:  Well appearing. No resp difficulty HEENT: normal Neck: supple. JVP . Carotids 2+ bilat; no bruits. No lymphadenopathy or thyromegaly appreciated. Cor: Mechanical heart sounds with LVAD hum present. Lungs: clear Abdomen: soft, nontender, nondistended. No hepatosplenomegaly. No bruits or masses. Good bowel sounds. Driveline: C/D/I; securement device intact and  driveline incorporated Extremities: no cyanosis, clubbing, rash, edema Neuro: alert & orientedx3, cranial nerves grossly intact. moves all 4 extremities w/o difficulty. Affect pleasant   Telemetry   N/A  EKG    No new EKG to review   Labs   Basic Metabolic Panel: Recent Labs  Lab 06/23/21 0046 06/24/21 0100 06/25/21 0107 06/26/21 0044 06/27/21 0047 06/28/21 0046 06/29/21 0537  NA 135 133* 133* 134* 135 134*  --   K 4.1 4.3 4.2 4.1 3.8 3.7  --   CL 101 100 101 104 102 103  --   CO2 26 23 25 24 26 24   --   GLUCOSE 90 84 85 87 79 91  --   BUN 11 11 14 13 11 11   --   CREATININE 1.37* 1.35* 1.57* 1.38* 1.24 1.32*  --   CALCIUM 8.7* 8.5* 8.5* 8.5* 8.7* 8.4*  --   MG 1.8  --  1.7 1.7  --  1.8 1.6*    Liver Function Tests: No results for input(s): AST, ALT, ALKPHOS, BILITOT, PROT, ALBUMIN in the last 168 hours. No results for input(s): LIPASE, AMYLASE in the last 168 hours. No results for input(s): AMMONIA in the last 168 hours.  CBC: Recent Labs  Lab 06/24/21 0100 06/25/21 0107 06/26/21 0044 06/27/21 0047 06/28/21 0046  WBC 10.2 8.6 8.0 7.3 6.8  HGB 12.6* 12.3* 11.9* 12.1* 12.3*  HCT 39.8 37.9* 36.8* 37.9* 38.0*  MCV 84.7 83.7 83.3 84.2 83.5  PLT 200 197 185 193 183    INR: Recent Labs  Lab 06/25/21  0107 06/26/21 0044 06/27/21 0047 06/28/21 0046 06/29/21 0537  INR 1.0 1.2 1.3* 1.2 1.5*    Other results: EKG:    Imaging   No results found.   Medications:     Scheduled Medications:  amiodarone  200 mg Oral Daily   atorvastatin  40 mg Oral Daily   Chlorhexidine Gluconate Cloth  6 each Topical Daily   gabapentin  300 mg Oral BID   losartan  25 mg Oral Daily   magnesium oxide  400 mg Oral BID   mouth rinse  15 mL Mouth Rinse BID   pantoprazole  40 mg Oral Daily   polyethylene glycol  17 g Oral Daily   senna-docusate  2 tablet Oral QPC supper   tamsulosin  0.4 mg Oral Daily   topiramate  25 mg Oral QHS   traZODone  50 mg Oral QHS    warfarin  1.5 mg Oral ONCE-1600   Warfarin - Pharmacist Dosing Inpatient   Does not apply q1600    Infusions:   PRN Medications: acetaminophen, alum & mag hydroxide-simeth, bisacodyl, diphenhydrAMINE, guaiFENesin-dextromethorphan, hydrALAZINE, ipratropium-albuterol, lidocaine, oxyCODONE, polyethylene glycol, polyvinyl alcohol, prochlorperazine **OR** prochlorperazine **OR** prochlorperazine, sodium phosphate, traMADol, traZODone    Assessment/Plan:    1. Intracerebral hemorrhage - CT brain with intracerebral and intraventricular hemorrhage with obstructive hydrocephalus. INR 2.4 - AC reversed with Kcentra/vit k in ER - Underwent bedside ventriculostomy on 2/11 - Initial ventriculostomy drain occluded. CT head 2/17 with increased hemorrhage in right lateral ventricle, slightly increased ventricular dilation.  On 2/18, ventriculostomy drain replaced.  - CT 2/25 improved ventriculomegaly. Drain pulled 2/25 - CT 2/27 stable intraparenchymal hemorrhage in the frontal lobe and basal ganglia on the right. Midline shift is slightly reduced from the prior exam. - Increased confusion on 03/04. CT with stable intraparenchymal hemorrhage.  - Continues with headache. No recurrent confusion - monitor w/ resumption of coumadin, no heparin.    2. Chronic systolic CHF with HM III LVAD implant 04/20/17:  - Has done well with VAD support. - He has been seen at [facility] for transplant referral but has decided not to pursue transplant. No change - New oxygen requirement with pulmonary edema on 2/17 CXR => good diuresis on 2/18.  - Volume status stable. - 2/27 and 2/28 Coumadin ordered not but given. Started 03/01. INR 1.5 this am. Dosing per Pharmacy. - LDH stable.    3. Paroxysmal AFL with RVR - had DC-CV on 10/17/18 with subsequent short episodes of AF after but has been mostly in NSR on amio - Had recurrent AF in 10/22.  - maintaining SR.  - Continue amio 200 mg daily - Continue coumadin.    4. h/o  UGI AVM bleed - s/p APC x 4 lesions in 2/19 - No recurrent bleeding  - Continue PPI - Off ASA due to GIB.  - Hgb stable at 12.3   5. AKI on CKD Stage IIIa:  - Creatinine baseline 1.3 - 1.7 - SCr 1.32 yesterday - BMP ordered for AM    6. VAD management - VAD interrogated personally. Parameters stable. - LDH has been stable.  - INR goal 1.5-2.0 Off ASA due to GIB 2/19.  - INR 2.4 reversed due to [place]. INR 1.5 today  - Continue coumadin, no heparin - MAP stable.  - DL site looks good   6. HTN - goal MAP 70-90 with ICH  - losartan increased 2/14 with resultant hypotension and AKI. Hydralazine stopped.  - Losartan decreased to 25 mg  daily on 02/18 d/t bump in Scr.  - MAP 70s-80s. Continue to monitor.   7. CAD:   - s/p PCI RCA/PLOM with DES x2 and DES to mild LAD 1/18 - No chest pain.  - Continue statin. Off ASA due to GIB   8. Urinary retention - continue Flomax  - Foley replaced 03/01   9. Hypomagnesemia - Mag 1.6. Supp given  - repeat level in AM     I reviewed the LVAD parameters from today, and compared the results to the patient's prior recorded data.  No programming changes were made.  The LVAD is functioning within specified parameters.  The patient performs LVAD self-test daily.  LVAD interrogation was negative for any significant power changes, alarms or PI events/speed drops.  LVAD equipment check completed and is in good working order.  Back-up equipment present.   LVAD education done on emergency procedures and precautions and reviewed exit site care.  Length of Stay: 1  Nelida Gores 06/29/2021, 2:24 PM  VAD Team --- VAD ISSUES ONLY--- Pager [phone] (7am - 7am)  Advanced Heart Failure Team  Pager [phone] (M-F; 7a - 5p)  Please contact [facility] for night-coverage after hours (5p -7a ) and weekends on [email or web address]  Patient seen and examined with the above-signed Advanced Practice Provider and/or Housestaff. I personally reviewed laboratory  data, imaging studies and relevant notes. I independently examined the patient and formulated the important aspects of the plan. I have edited the note to reflect any of my changes or salient points. I have personally discussed the plan with the patient and/or family.  Feeling better today. Still with mild HA but feels it is improving. Able to ambulate today. Feels stronger. Denies SOB. MAPS ok. INR 1.5  VAD interrogated personally. Parameters stable.  General:  NAD.  HEENT: normal  Neck: supple. JVP not elevated.  Carotids 2+ bilat; no bruits. No lymphadenopathy or thryomegaly appreciated. Cor: LVAD hum.  Lungs: Clear. Abdomen: soft, nontender, non-distended. No hepatosplenomegaly. No bruits or masses. Good bowel sounds. Driveline site clean. Anchor in place.  Extremities: no cyanosis, clubbing, rash. Warm no edema  Neuro: alert & oriented x 3. No focal deficits. Moves all 4 without problem   Improving with rehab. INR 1.5 (goal 1.5-2.0). Neurologically stable. MAPs and volume status look good.   VAD interrogated personally. Parameters stable. Mag has been supped.   Appreciate CIR's care.   Glori Bickers, MD  8:08 PM

## 2021-06-29 NOTE — Progress Notes (Signed)
LVAD Coordinator Rounding Note: ? ?Admitted 06/05/21 due to [place] with hydrocephalus to Dr Bensimhon's service. Ventriculostomy placed at bedside by Dr Arnoldo Morale.  ? ?HM III LVAD implanted on 04/20/17 by Dr Cyndia Bent under destination therapy criteria. ? ?2/11 Presented with I[facility] ?2/11 Underwent bedside ventriculostomy wit NSU ?2/12 Repeat head CT with resolution of hydrocephalus. Persistent hematoma ?2/17 Repeat CT head with increased hemorrhage in right lateral ventricle, slightly increased ventricular dilation.  The ventriculostomy drain is occluded.  ?2/18 Ventriculostomy drain replaced ?2/25 CT improved ventriculomegaly. Drain pulled ?2/27 F/u CT w/ stable intraparenchymal hemorrhage. Unchanged from prior.    ?3/4 Head CT no change from prior ?3/6 d/c to CIR ? ?Per Dr Arnoldo Morale (neurosurgery) he will sign off for now. Pt will need follow up with him in his office in a few weeks. Will need appt at discharge.  ? ?Pt sitting on the side of the bed working with PT. Reports he has a 5/10 constant headache. Recently received PRN Tramadol. Pt knew my name, and is alert to person and place this morning.  ? ?Foley removed yesterday. ? ?Restarted Coumadin 3/1. Will closely monitor for neuro changes with anticoagulation restart. INR today 1.5 ? ?Pt accidentally stood this morning and did not secure controller around his neck. Anchor pulled off of dressing. Anchor removed and "pants" on sorbaview removed and replaced. ? ?Vital signs: ?Temp: 97.8 ?HR: 95 ?Doppler Pressure: 92 ?Automatic BP: 108/83 (92) ?O2 Sat: 98% on RA ?Wt:177.4 lbs   ? ?LVAD interrogation reveals:  ?Speed: 5400 ?Flow: 4.4 ?Power:  4.3w ?PI: 5.9 ?Hematocrit: 38 ? ?Alarms: none ?Events: none ? ?Fixed speed: 5400 ?Low speed limit: 5100 ? ?Drive Line: CDI Weekly dressing changes per VAD coordinator or pt's wife. Next dressing change due: 07/05/21. ? ?Labs:  ?LDH trend: 166>176>157>162>165>190>205>176>178>174>173>171>174>191>158>192>165>172>172 ? ?INR trend:  1.0>1.2>1.1>1.2>1.1>1.2>1.1>1.0>1.1>1.0>1.1>1.2>1.2>1.0>1.2>1.5 ? ? ?Anticoagulation Plan: ?-INR Goal: 1.5-2   ?-ASA Dose: none due to hx of GI bleed ? ?Device: ?- [employer]tr[employer] BiV ?-Therapies: on- VF 200, VT 167 ? AT 171 monitored ?- Last check: 04/29/21 ? ?Adverse Events on VAD: ?- Readmitted 2/19 hgb 3.8. INR 2.9.transfused 4u pRBCs. EGD - normal esophagus, 4 nonbleeding gastric AVMs treated with APC, normal duodenum and jejunum ?- Underwent DC-CV for AFL on 10/17/18  ?- Admitted 06/05/21 for [place] with ventriculostomy placement.  ? ?Plan/Recommendations:  ?1. Call VAD coordinator for any VAD equipment or drive line issues ?2. Weekly drive line dressing change per VAD coordinator or pt's wife. Next dressing change due 07/05/21. ? ? ?Tanda Rockers RN ?VAD Coordinator  ?Office: [phone]  ?24/7 Pager: [phone]  ? ? ?  ?

## 2021-06-29 NOTE — Evaluation (Signed)
Occupational Therapy Assessment and Plan  Patient Details  Name: David [name] MRN: [id number] Date of Birth: 05-Dec-1965  OT Diagnosis: acute pain, cognitive deficits, hemiplegia affecting non-dominant side, and muscle weakness (generalized) Rehab Potential: Rehab Potential (ACUTE ONLY): Good ELOS: 7-10   Today's Date: 06/29/2021 OT Individual Time: 0800-0900 OT Individual Time Calculation (min): 60 min     Hospital Problem: Principal Problem:   ICH (intracerebral hemorrhage) ([place])   Past Medical History:  Past Medical History:  Diagnosis Date   AICD (automatic cardioverter/defibrillator) present    Anemia    Anxiety    CAD (coronary artery disease) 2009   AMI in 12/2007 with PCI to LAD, staged PCI to  mid/distal RCA, NSTEMI in 02/2009 with BMS to LCx   CHF (congestive heart failure) ([place])    Chronic kidney disease 11/03/2016   stage 3 kidney disease   Dysrhythmia    "arrythmia problems at some point", "fatal rhythms"   History of blood transfusion    "I was bleeding from was rectum" (04/19/2017)   HLD (hyperlipidemia)    HTN (hypertension)    Hyperlipidemia 03/15/2011   Ischemic cardiomyopathy    Admitted in 07/2010 with CHF exacerbation   Myocardial infarction ([place])    "I've had 4" (04/19/2017)   Pneumonia 2018 X 1   Seizures ([employer])    one seizure in 04/2016 during cardiac event (04/19/2017)   STEMI 2009 (anterior), 2010 (lateral), and 2012 (inferior) 03/14/2011   Past Surgical History:  Past Surgical History:  Procedure Laterality Date   AORTIC VALVE REPAIR N/A 04/20/2017   Procedure: AORTIC VALVE REPAIR;  Surgeon: Gaye Pollack, MD;  Location: [facility];  Service: Open Heart Surgery;  Laterality: N/A;   BIV ICD INSERTION CRT-D N/A 08/01/2016   Procedure: BiV ICD Insertion CRT-D;  Surgeon: Will Meredith Leeds, MD;  Location: [place] CV LAB;  Service: Cardiovascular;  Laterality: N/A;   CARDIAC CATHETERIZATION N/A 05/05/2016   Procedure: Right/Left Heart Cath and  Coronary Angiography;  Surgeon: Jolaine Artist, MD;  Location: [place] [facility];  Service: Cardiovascular;  Laterality: N/A;   CARDIAC CATHETERIZATION N/A 05/09/2016   Procedure: Coronary Stent Intervention;  Surgeon: Peter M Martinique, MD;  Location: [facility] [facility];  Service: Cardiovascular;  Laterality: N/A;   CARDIAC CATHETERIZATION N/A 05/09/2016   Procedure: Intravascular Pressure Wire/FFR Study;  Surgeon: Peter M Martinique, MD;  Location: [place] [facility];  Service: Cardiovascular;  Laterality: N/A;   CARDIOVERSION N/A 10/17/2018   Procedure: CARDIOVERSION;  Surgeon: Jolaine Artist, MD;  Location: [facility];  Service: Cardiovascular;  Laterality: N/A;   CORONARY ANGIOPLASTY WITH STENT PLACEMENT     "i've got a total of 12 stents" (04/19/2017)   ELECTROPHYSIOLOGY STUDY N/A 06/24/2016   Procedure: Electrophysiology Study;  Surgeon: Deboraha Sprang, MD;  Location: [facility] [facility];  Service: Cardiovascular;  Laterality: N/A;   ENTEROSCOPY N/A 06/03/2017   Procedure: ENTEROSCOPY;  Surgeon: Yetta Flock, MD;  Location: [facility] ENDOSCOPY;  Service: Gastroenterology;  Laterality: N/A;   EPICARDIAL PACING LEAD PLACEMENT Left 11/07/2016   Procedure: LV EPICARDIAL PACING LEAD PLACEMENT VIA LEFT MINI THORACOTOMY;  Surgeon: Gaye Pollack, MD;  Location: [facility];  Service: Thoracic;  Laterality: Left;   ICD IMPLANT N/A 06/24/2016   Procedure: POSSIBLE  ICD Implant;  Surgeon: Deboraha Sprang, MD;  Location: [place] [facility];  Service: Cardiovascular;  Laterality: N/A;   INSERTION OF IMPLANTABLE LEFT VENTRICULAR ASSIST DEVICE N/A 04/20/2017   Procedure:  INSERTION OF IMPLANTABLE LEFT VENTRICULAR ASSIST DEVICE, Aortic Valve repair;  Surgeon: Alleen Borne, MD;  [facility];  Service: Open Heart Surgery;  Laterality: N/A;  Heartmate 3   IR FLUORO GUIDE CV LINE RIGHT  05/08/2017   IR FLUORO GUIDE CV MIDLINE PICC RIGHT  03/14/2017   IR REMOVAL TUN CV CATH W/O FL  05/25/2017   IR US GUIDE VASC  ACCESS RIGHT  03/14/2017   IR US GUIDE VASC ACCESS RIGHT  05/08/2017   LEFT HEART CATHETERIZATION WITH CORONARY ANGIOGRAM N/A 03/13/2011   Procedure: LEFT HEART CATHETERIZATION WITH CORONARY ANGIOGRAM;  Surgeon: Runell Gess, MD;  Location: [facility] CATH LAB;  Service: Cardiovascular;  Laterality: N/A;   MULTIPLE EXTRACTIONS WITH ALVEOLOPLASTY N/A 04/12/2017   Procedure: Extraction of tooth #'s 2, 5-12, 17, 20-29 with alveoloplasty amd maxillary right buccal exostoses reductions.;  Surgeon: Charlynne Pander, DDS;  Location: [facility];  Service: Oral Surgery;  Laterality: N/A;   RIGHT HEART CATH N/A 04/19/2017   Procedure: RIGHT HEART CATH;  Surgeon: Dolores Patty, MD;  [facility];  Service: Cardiovascular;  Laterality: N/A;   RIGHT/LEFT HEART CATH AND CORONARY ANGIOGRAPHY N/A 03/10/2017   Procedure: RIGHT/LEFT HEART CATH AND CORONARY ANGIOGRAPHY;  Surgeon: Dolores Patty, MD;  [facility];  Service: Cardiovascular;  Laterality: N/A;   TEE WITHOUT CARDIOVERSION N/A 04/20/2017   Procedure: TRANSESOPHAGEAL ECHOCARDIOGRAM (TEE);  Surgeon: Alleen Borne, MD;  [facility];  Service: Open Heart Surgery;  Laterality: N/A;   TEE WITHOUT CARDIOVERSION N/A 10/17/2018   Procedure: TRANSESOPHAGEAL ECHOCARDIOGRAM (TEE);  Surgeon: Dolores Patty, MD;  Location: [facility] ENDOSCOPY;  Service: Cardiovascular;  Laterality: N/A;    Assessment & Plan Clinical Impression: 56 y.o. male who presented 06/05/21 with worsening HA. CT showed right basal ganglia hemorrhage, intraventricular hemorrhage and obstructive hydrocephalus. S/p placement of right frontal ventriculostomy via bur hole 2/11. 2/12 Repeat head CT with resolution of hydrocephalus. 2/25 drain pulled. 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  Prior to hospitalization, patient could complete BADL/IADL with independent .  Patient will benefit from skilled intervention to decrease level of assist with basic self-care skills prior to discharge home with care partner.  Anticipate patient will require 24 hour supervision and follow up home health.  OT - End of Session Activity Tolerance: Tolerates 30+ min activity with multiple rests Endurance Deficit: Yes OT Assessment Rehab Potential (ACUTE ONLY): Good OT Patient demonstrates impairments in the following area(s): Balance;Behavior;Cognition;Endurance;Motor;Nutrition;Pain;Perception;Safety OT Basic ADL's Functional Problem(s): Grooming;Bathing;Dressing;Toileting OT Transfers Functional Problem(s): Toilet;Tub/Shower OT Additional Impairment(s): Fuctional Use of Upper Extremity OT Plan OT Intensity: Minimum of 1-2 x/day, 45 to 90 minutes OT Frequency: 5 out of 7 days OT Duration/Estimated Length of Stay: 7-10 OT Treatment/Interventions: Balance/vestibular training;Discharge planning;Pain management;Self Care/advanced ADL retraining;Therapeutic Activities;UE/LE Coordination activities;Visual/perceptual remediation/compensation;Therapeutic Exercise;Skin care/wound managment;Patient/family education;Functional mobility training;Disease mangement/prevention;Cognitive remediation/compensation;Community reintegration;DME/adaptive equipment instruction;Neuromuscular re-education;Psychosocial support;Splinting/orthotics;UE/LE Strength taining/ROM;Wheelchair propulsion/positioning OT Self Feeding Anticipated Outcome(s): S OT Basic Self-Care Anticipated Outcome(s): S OT Toileting Anticipated Outcome(s): S OT Bathroom Transfers Anticipated  Outcome(s): S OT Recommendation Patient destination: Home Follow Up Recommendations: Home health OT Equipment Recommended: None recommended by OT  OT Evaluation Precautions/Restrictions  Restrictions Weight Bearing Restrictions: No General   Vital Signs Therapy Vitals Temp: 97.8 F (36.6 C) Temp Source: Oral Pulse Rate: 95 Resp: 14 BP: 108/83 Patient Position (if appropriate): Sitting Oxygen Therapy SpO2: 98 % O2 Device: Room Air Pain Pain Assessment Pain Scale: 0-10 Pain Score: 5  Pain Type: Acute pain Pain Location: Head Pain Descriptors / Indicators: Aching Pain Frequency: Constant Pain Onset: On-going Pain Intervention(s): Medication (See eMAR) Home Living/Prior Functioning Home Living Family/patient expects to be discharged to:: Private residence Living Arrangements: Spouse/significant other Available Help at Discharge: Family, Available 24 hours/day Home Access: Stairs to enter Technical brewer of Steps: 2 Home Layout: One level Bathroom Shower/Tub: Sponge bathes at baseline [employer]: Standard Bathroom Accessibility: Yes IADL History Leisure and Hobbies: fishing Prior Function Level of Independence: Independent with basic ADLs, Independent with homemaking with ambulation Vision Baseline Vision/History: 0 No visual deficits Ability to See in Adequate Light: 1 Impaired Patient Visual Report: No change from baseline Vision Assessment?: No apparent visual deficits Perception  Perception: Impaired Inattention/Neglect: Does not attend to left visual field;Does not attend to left side of body Praxis Praxis: Intact Cognition Orientation Level: Person;Place;Situation Person: Oriented Place: Oriented Situation: Oriented Year: 2023 Month: March Day of Week: Incorrect Memory: Appears intact Immediate Memory Recall: Sock;Blue;Bed Memory Recall Sock: With Cue Memory Recall Blue: Without Cue Memory Recall Bed: Without Cue Awareness:  Impaired Safety/Judgment: Impaired Sensation Sensation Light Touch: Appears Intact Coordination Gross Motor Movements are Fluid and Coordinated: Yes Fine Motor Movements are Fluid and Coordinated: No Finger Nose Finger Test: minimally slower LUE Motor  Motor Motor: Hemiplegia Motor - Skilled Clinical Observations: very mild L hemi  Trunk/Postural Assessment    Head forward, rounded shoudlers, posterior pelvic tilt Balance Balance Balance Assessed: Yes Dynamic Sitting Balance Dynamic Sitting - Level of Assistance: 5: Stand by assistance Static Standing Balance Static Standing - Level of Assistance: 5: Stand by assistance Dynamic Standing Balance Dynamic Standing - Level of Assistance: 4: Min assist;5: Stand by assistance Dynamic Standing - Comments: increased assist with fatigue/L lean Extremity/Trunk Assessment RUE Assessment RUE Assessment: Within Functional Limits LUE Assessment LUE Assessment: Exceptions to [facility] General Strength Comments: slightly decreased strength and coordination with LUE LUE Body System: Neuro Brunstrum levels for arm and hand: Arm;Hand Brunstrum level for arm: Stage V Relative Independence from Synergy Brunstrum level for hand: Stage V Independence from basic synergies  Care Tool Care Tool Self Care Eating        Oral Care       MIN STANDING   Bathing      MIN STANDING BALANCE FOR WASHING BLE        Upper Body Dressing(including orthotics)    Min STANDING        Lower Body Dressing (excluding footwear)  MIN STANDING  Putting on/Taking off footwear SUPERVISION SEATED     Care Tool Toileting Toileting activity    MIN a SIT TO STAND        Care Tool Bed Mobility Roll left and right activity        Sit to lying activity        Lying to sitting on side of bed activity         Care Tool Transfers Sit to stand transfer    MIN A    Chair/bed transfer    MIN A     Toilet transfer  MIN A       Care Tool  Cognition  Expression  of Ideas and Wants    Understanding Verbal and Non-Verbal Content     Memory/Recall Ability     Refer to Care Plan for Long Term Goals  SHORT TERM GOAL WEEK 1 OT Short Term Goal 1 (Week 1): STG=LTG d/t ELOS  Recommendations for other services: Therapeutic Recreation  Outing/community reintegration   Skilled Therapeutic Intervention 1:1. Pt received in bed agreeable to OT after edu re role/purpose, CIR, ELOS, and POC. Pt completes ADL in standing as stated below. Pt demo poor awareness of driveline and LVAD initially standing with remote on bed while OT gathering linens requring cuing to sit and OT instpects driveline. Anchor partially coming off and alerted RN. Pt ambulates into bathroom for bowel and bladder (small) movement on toilet and insists on standing for LB dressing with MIN A to don underwear. Edu re seated LB dressing for improved safety with no evidence of leanring. Pt completes standing bathing at sink with MIN A for standing to wash feet- S for UB bathing. Groomign in standing with MIN tactile cuing with increased time and L lean +knee flexion. Mirror used for midline orientation. Exited session with pt seated in bed, exit alarm on and call light in reach. RN in room.    ADL ADL Grooming: Contact guard Where Assessed-Grooming: Standing at sink Upper Body Bathing: Minimal assistance Where Assessed-Upper Body Bathing: Standing at sink Lower Body Bathing: Minimal assistance Where Assessed-Lower Body Bathing: Standing at sink Upper Body Dressing: Minimal assistance Where Assessed-Upper Body Dressing: Standing at sink Lower Body Dressing: Minimal assistance Where Assessed-Lower Body Dressing: Sitting at sink;Standing at sink Toileting: Minimal assistance Where Assessed-Toileting: Glass blower/designer: Psychiatric nurse Method: Counselling psychologist: Energy manager Method: Unable to assess (LVAD- sponge  bathing only) Mobility  Transfers Sit to Stand: Minimal Assistance - Patient > 75% Stand to Sit: Minimal Assistance - Patient > 75%   Discharge Criteria: Patient will be discharged from OT if patient refuses treatment 3 consecutive times without medical reason, if treatment goals not met, if there is a change in medical status, if patient makes no progress towards goals or if patient is discharged from hospital.  The above assessment, treatment plan, treatment alternatives and goals were discussed and mutually agreed upon: by patient  Tonny Branch 06/29/2021, 12:15 PM

## 2021-06-29 NOTE — Progress Notes (Signed)
Inpatient Rehabilitation Center ?Individual Statement of Services ? ?Patient Name:  David [name]  ?Date:  06/29/2021 ? ?Welcome to the [facility].  Our goal is to provide you with an individualized program based on your diagnosis and situation, designed to meet your specific needs.  With this comprehensive rehabilitation program, you will be expected to participate in at least 3 hours of rehabilitation therapies Monday-Friday, with modified therapy programming on the weekends. ? ?Your rehabilitation program will include the following services:  Physical Therapy (PT), Occupational Therapy (OT), Speech Therapy (ST), 24 hour per day rehabilitation nursing, Therapeutic Recreaction (TR), Neuropsychology, Care Coordinator, Rehabilitation Medicine, Nutrition Services, Pharmacy Services, and Other ? ?Weekly team conferences will be held on Wednesdays to discuss your progress.  Your Inpatient Rehabilitation Care Coordinator will talk with you frequently to get your input and to update you on team discussions.  Team conferences with you and your family in attendance may also be held. ? ?Expected length of stay:  10-12 Days  Overall anticipated outcome:  MOD I to Supervision ? ?Depending on your progress and recovery, your program may change. Your Inpatient Rehabilitation Care Coordinator will coordinate services and will keep you informed of any changes. Your Inpatient Rehabilitation Care Coordinator's name and contact numbers are listed  below. ? ?The following services may also be recommended but are not provided by the [facility]:  ? ?Home Health Rehabiltiation Services ?Outpatient Rehabilitation Services ? ?  ?Arrangements will be made to provide these services after discharge if needed.  Arrangements include referral to agencies that provide these services. ? ?Your insurance has been verified to be:  [employer] ?Your primary doctor is:  Gilford Rile, MD  ? ?Pertinent  information will be shared with your doctor and your insurance company. ? ?Inpatient Rehabilitation Care Coordinator:  Erlene Quan, [place] or ([phone] ? ?Information discussed with and copy given to patient by: Dyanne Iha, 06/29/2021, 11:20 AM    ?

## 2021-06-30 DIAGNOSIS — I611 Nontraumatic intracerebral hemorrhage in hemisphere, cortical: Secondary | ICD-10-CM

## 2021-06-30 LAB — PROTIME-INR
INR: 1.7 — ABNORMAL HIGH (ref 0.8–1.2)
Prothrombin Time: 19.5 seconds — ABNORMAL HIGH (ref 11.4–15.2)

## 2021-06-30 LAB — BASIC METABOLIC PANEL
Anion gap: 8 (ref 5–15)
BUN: 7 mg/dL (ref 6–20)
CO2: 23 mmol/L (ref 22–32)
Calcium: 8.6 mg/dL — ABNORMAL LOW (ref 8.9–10.3)
Chloride: 106 mmol/L (ref 98–111)
Creatinine, Ser: 1.45 mg/dL — ABNORMAL HIGH (ref 0.61–1.24)
GFR, Estimated: 56 mL/min — ABNORMAL LOW (ref >60)
Glucose, Bld: 83 mg/dL (ref 70–99)
Potassium: 4.4 mmol/L (ref 3.5–5.1)
Sodium: 137 mmol/L (ref 135–145)

## 2021-06-30 LAB — LACTATE DEHYDROGENASE: LDH: 194 U/L — ABNORMAL HIGH (ref 98–192)

## 2021-06-30 LAB — MAGNESIUM: Magnesium: 1.9 mg/dL (ref 1.7–2.4)

## 2021-06-30 MED ORDER — TOPIRAMATE 25 MG PO TABS
50.0000 mg | ORAL_TABLET | Freq: Every day | ORAL | Status: DC
  Administered 2021-06-30: 50 mg via ORAL
  Filled 2021-06-30: qty 2

## 2021-06-30 MED ORDER — WARFARIN SODIUM 1 MG PO TABS
1.5000 mg | ORAL_TABLET | Freq: Once | ORAL | Status: AC
  Administered 2021-06-30: 1.5 mg via ORAL
  Filled 2021-06-30: qty 1

## 2021-06-30 MED ORDER — MAGNESIUM SULFATE 2 GM/50ML IV SOLN
2.0000 g | Freq: Once | INTRAVENOUS | Status: AC
  Administered 2021-06-30: 2 g via INTRAVENOUS
  Filled 2021-06-30: qty 50

## 2021-06-30 NOTE — Progress Notes (Signed)
Occupational Therapy Session Note ? ?Patient Details  ?Name: David [name] ?MRN: [id number] ?Date of Birth: [birth year] ? ?Today's Date: 06/30/2021 ?OT Individual Time: 1300-1400 ?OT Individual Time Calculation (min): 60 min  ? ? ?Short Term Goals: ?Week 1:  OT Short Term Goal 1 (Week 1): STG=LTG d/t ELOS ? ?Skilled Therapeutic Interventions/Progress Updates:  ?   ?Pt received in bed with HA reported and RN delivers pain medicaiton at end of session. Rest provided PRN ? ?Therapeutic activity ?Focus of session on visual scanning and L attention. Pt completes many BITS activities in standing working on memory, visual memory, bells canceltation test, and trailmaking B test ? ?Memory- words up to 5 in order ?Visual memory with errors on level 3-4 ?Bells cancelation test 2 min 1 sec with 9 misses (pt reporting very surprised by how many he missed-building awareness) ?Trailmaking B test 2 min 11 seconds and 2 errors ? ?Pt completes functional mobility in hallway with no AD and Superviison with edu re lighthouse method for visual scanning for post it notes 1-10. Pt requires nearly 20 min to complete activity d/t L inattention.  ? ?Pt left at end of session in EOB with exit alarm on, RN in room, call light in reach and all needs met ? ? ?Therapy Documentation ?Precautions:  ?Precautions ?Precautions: Fall, Other (comment) ?Precaution Comments: LVAD (x4 year hx), L inattention ?Restrictions ?Weight Bearing Restrictions: No ?General: ?  ? ? ?Therapy/Group: Individual Therapy ? ?Lowella Dell Aryiana Klinkner ?06/30/2021, 6:50 AM ?

## 2021-06-30 NOTE — Progress Notes (Signed)
Physical Therapy Session Note ? ?Patient Details  ?Name: David [name] ?MRN: [id number] ?Date of Birth: 15-Oct-1965 ? ?Today's Date: 06/30/2021 ?PT Individual Time: 7622-6333 ?PT Individual Time Calculation (min): 23 min  ? ?Short Term Goals: ?Week 1:  PT Short Term Goal 1 (Week 1): Pt will demonstrate standing functional transfers at supervision. ?PT Short Term Goal 2 (Week 1): Pt will ambulate with improved L foot clearance and improved attention to L with close supervision. ?PT Short Term Goal 3 (Week 1): Pt will ascend/ descend at least 4 steps using no HR with supervision. ?PT Short Term Goal 4 (Week 1): Pt will complete Functional Gait Assessment. ?PT Short Term Goal 5 (Week 1): Pt will complete Berg Balance Test. ?Week 2:    ? ?Skilled Therapeutic Interventions/Progress Updates:  ? ?Pt received supine in bed and agreeable to PT. PT assisted pt to don batteries and clips for LVAD with max assist for time management. Supine>sit transfer with supervision assist and cues for management of LVAD lines.  ? ?Gait training with hall distant supervision assist with cues for awareness of obstacles on the L intermittently.  ? ?Dynamci balance training to perform forward ball toss off rebounder from level surface and small wedge x 1 min each. Lateral ball toss of rebounder from level surface x 1 min each bil. Min assist from PT for safety and due cues attention to the LUE to improve catch coordination and success.  ? ?Pt returned to room and performed ambulatory transfer to bed with no AD and supervision assist. Sit>supine completed without assist. PT doffed batteries and assisted with access to wall power unit.  and left supine in bed with call bell in reach and all needs met.  ? ? ?   ? ?Therapy Documentation ?Precautions:  ?Precautions ?Precautions: Fall, Other (comment) ?Precaution Comments: LVAD (x4 year hx), L inattention ?Restrictions ?Weight Bearing Restrictions: No ? ?Vital Signs: ?Therapy Vitals ?Temp: 97.7 ?F  (36.5 ?C) ?Temp Source: Oral ?Pulse Rate: 78 ?Resp: 17 ?BP: (!) 88/75 ?Patient Position (if appropriate): Lying ?Oxygen Therapy ?SpO2: 96 % ?O2 Device: Room Air ?Pain: ?Pain Assessment ?Pain Scale: 0-10 ?Pain Score: 5  ?Pain Location: Head ?Pain Orientation: Left;Right ?Pain Descriptors / Indicators: Headache ?Pain Onset: On-going ?Multiple Pain Sites: No ? ? ?Therapy/Group: Individual Therapy ? ?Lorie Phenix ?06/30/2021, 3:56 PM  ?

## 2021-06-30 NOTE — Progress Notes (Signed)
ANTICOAGULATION CONSULT NOTE ? ?Pharmacy Consult for warfarin ?Indication:  LVAD ? ?Allergies  ?Allergen Reactions  ? Plavix [Clopidogrel Bisulfate] Hives  ? ? ?Patient Measurements: ?Height: 5\' 5"  (165.1 cm) ?Weight: 80 kg (176 lb 5.9 oz) ?IBW/kg (Calculated) : 61.5 ? ?Vital Signs: ?Temp: 98.5 ?F (36.9 ?C) (03/08 0441) ?Temp Source: Oral (03/08 0441) ?BP: 99/76 (03/08 0441) ?Pulse Rate: 90 (03/08 0441) ? ?Labs: ?Recent Labs  ?  06/28/21 ?5956 06/29/21 ?3875 06/30/21 ?6433  ?HGB 12.3*  --   --   ?HCT 38.0*  --   --   ?PLT 183  --   --   ?LABPROT 15.6* 18.0* 19.5*  ?INR 1.2 1.5* 1.7*  ?CREATININE 1.32*  --  1.45*  ? ? ? ?Estimated Creatinine Clearance: 54.8 mL/min (A) (by C-G formula based on SCr of 1.45 mg/dL (H)). ? ? ?Medical History: ?Past Medical History:  ?Diagnosis Date  ? AICD (automatic cardioverter/defibrillator) present   ? Anemia   ? Anxiety   ? CAD (coronary artery disease) 2009  ? AMI in 12/2007 with PCI to LAD, staged PCI to  mid/distal RCA, NSTEMI in 02/2009 with BMS to LCx  ? CHF (congestive heart failure) ([place])   ? Chronic kidney disease 11/03/2016  ? stage 3 kidney disease  ? Dysrhythmia   ? "arrythmia problems at some point", "fatal rhythms"  ? History of blood transfusion   ? "I was bleeding from was rectum" (04/19/2017)  ? HLD (hyperlipidemia)   ? HTN (hypertension)   ? Hyperlipidemia 03/15/2011  ? Ischemic cardiomyopathy   ? Admitted in 07/2010 with CHF exacerbation  ? Myocardial infarction [facility])   ? "I've had 4" (04/19/2017)  ? Pneumonia 2018 X 1  ? Seizures ([place])   ? one seizure in 04/2016 during cardiac event (04/19/2017)  ? STEMI 2009 (anterior), 2010 (lateral), and 2012 (inferior) 03/14/2011  ? ? ?Assessment: ?84 yoM on warfarin PTA for hx HM3 LVAD. Pt admitted 2/11 with ICH requiring ventriculostomy. INR 2.4 on admit reversed with vitamin K and 4FPCC. Ventriculostomy drain removed 2/25. ? ?Warfarin ordered to resume 2/27 but not given until 3/1, head CT stable. INR therapeutic at 1.7  today. ? ?*Home warfarin dose 4mg  Fri, 2mg  AODs ? ?Goal of Therapy:  ?INR 1.5-2.0 (given ICH on warfarin within therapeutic range) ?Monitor platelets by anticoagulation protocol: Yes ?  ?Plan:  ?-Warfarin 1.5mg  PO x1 ?-Daily INR ? ?Cathrine Muster, PharmD ?[id number] Cardiology Pharmacy Resident ?Phone: [phone] ?06/30/2021  12:37 PM ? ?Please check [email or web address] for unit-specific pharmacy phone numbers. ? ? ? ? ?

## 2021-06-30 NOTE — Progress Notes (Signed)
LVAD Coordinator Rounding Note: ? ?Admitted 06/05/21 due to [place] with hydrocephalus to Dr Bensimhon's service. Ventriculostomy placed at bedside by Dr Arnoldo Morale.  ? ?HM III LVAD implanted on 04/20/17 by Dr Cyndia Bent under destination therapy criteria. ? ?2/11 Presented with ICH ?2/11 Underwent bedside ventriculostomy wit NSU ?2/12 Repeat head CT with resolution of hydrocephalus. Persistent hematoma ?2/17 Repeat CT head with increased hemorrhage in right lateral ventricle, slightly increased ventricular dilation.  The ventriculostomy drain is occluded.  ?2/18 Ventriculostomy drain replaced ?2/25 CT improved ventriculomegaly. Drain pulled ?2/27 F/u CT w/ stable intraparenchymal hemorrhage. Unchanged from prior.    ?3/4 Head CT no change from prior ?3/6 d/c to CIR ? ? ?Pt standing at sink working with PT; says he is feeling good today. Recognizes VAD Coordinator this am.  ? ? ?Vital signs: ?Temp: 98.5 ?HR: 90 ?Doppler Pressure: not documented ?Automatic BP: 99/76 (85) ?O2 Sat: 97% on RA ?Wt:177.4>176.3 lbs   ? ?LVAD interrogation reveals:  ?Speed: 5400 ?Flow: 4.1 ?Power:  4.0w ?PI: 5.2 ?Hematocrit: 38 ? ?Alarms: on batteries ?Events: on batteries ? ?Fixed speed: 5400 ?Low speed limit: 5100 ? ?Drive Line: CDI Weekly dressing changes per VAD coordinator or pt's wife. Next dressing change due: 07/05/21. ? ?Labs:  ?LDH trend: 172>194 ? ?INR trend: 1.5>1.7 ? ? ?Anticoagulation Plan: ?-INR Goal: 1.5-2   ?-ASA Dose: none due to hx of GI bleed ? ?Device: ?- Medtronic BiV ?-Therapies: on- VF 200, VT 167 ? AT 171 monitored ?- Last check: 04/29/21 ? ?Adverse Events on VAD: ?- Readmitted 2/19 hgb 3.8. INR 2.9.transfused 4u pRBCs. 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  ? ?Plan/Recommendations:  ?1. Call VAD coordinator for any VAD equipment or drive line issues ?2. Weekly drive line  dressing change per VAD coordinator or pt's wife. Next dressing change due 07/05/21. ? ? ?Zada Girt RN ?VAD Coordinator  ?Office: [phone]  ?24/7 Pager: [phone]  ? ? ?  ?

## 2021-06-30 NOTE — Progress Notes (Signed)
Patient ID: David [name], male   DOB: 14-Jan-1966, 56 y.o.   MRN: [id number] ? ?Team Conference Report to Patient/Family ? ?Team Conference discussion was reviewed with the patient and caregiver, including goals, any changes in plan of care and target discharge date.  Patient and caregiver express understanding and are in agreement.  The patient has a target discharge date of 07/10/21. ? ?Pt met with patient and spoke to David [name] by phone. Patient discharging home with spouse to provide supervision. Family education scheduled on 3-16 9-12. SW informed pt spouse that patient will require assistance with cognitive tasks. No additional questions or concerns. ? ?Dyanne Iha ?06/30/2021, 1:53 PM  ?

## 2021-06-30 NOTE — Plan of Care (Signed)
?  Problem: RH Balance ?Goal: LTG Patient will maintain dynamic standing balance (PT) ?Description: LTG:  Patient will maintain dynamic standing balance with assistance during mobility activities (PT) ?Flowsheets (Taken 06/30/2021 0534) ?LTG: Pt will maintain dynamic standing balance during mobility activities with:: Supervision/Verbal cueing ?  ?Problem: Sit to Stand ?Goal: LTG:  Patient will perform sit to stand with assistance level (PT) ?Description: LTG:  Patient will perform sit to stand with assistance level (PT) ?Flowsheets (Taken 06/30/2021 0534) ?LTG: PT will perform sit to stand in preparation for functional mobility with assistance level: Independent ?  ?Problem: RH Bed Mobility ?Goal: LTG Patient will perform bed mobility with assist (PT) ?Description: LTG: Patient will perform bed mobility with assistance, with/without cues (PT). ?Flowsheets (Taken 06/30/2021 0534) ?LTG: Pt will perform bed mobility with assistance level of: Independent with assistive device  ?  ?Problem: RH Bed to Chair Transfers ?Goal: LTG Patient will perform bed/chair transfers w/assist (PT) ?Description: LTG: Patient will perform bed to chair transfers with assistance (PT). ?Flowsheets (Taken 06/30/2021 0534) ?LTG: Pt will perform Bed to Chair Transfers with assistance level: Supervision/Verbal cueing ?  ?Problem: RH Car Transfers ?Goal: LTG Patient will perform car transfers with assist (PT) ?Description: LTG: Patient will perform car transfers with assistance (PT). ?Flowsheets (Taken 06/30/2021 0534) ?LTG: Pt will perform car transfers with assist:: Supervision/Verbal cueing ?  ?Problem: RH Furniture Transfers ?Goal: LTG Patient will perform furniture transfers w/assist (OT/PT) ?Description: LTG: Patient will perform furniture transfers  with assistance (OT/PT). ?Flowsheets (Taken 06/30/2021 0534) ?LTG: Pt will perform furniture transfers with assist:: Supervision/Verbal cueing ?  ?Problem: RH Ambulation ?Goal: LTG Patient will ambulate in  controlled environment (PT) ?Description: LTG: Patient will ambulate in a controlled environment, # of feet with assistance (PT). ?Flowsheets (Taken 06/30/2021 0534) ?LTG: Pt will ambulate in controlled environ  assist needed:: Supervision/Verbal cueing ?LTG: Ambulation distance in controlled environment: >200 ft with no AD and improved L awareness ?Goal: LTG Patient will ambulate in home environment (PT) ?Description: LTG: Patient will ambulate in home environment, # of feet with assistance (PT). ?Flowsheets (Taken 06/30/2021 0534) ?LTG: Pt will ambulate in home environ  assist needed:: Supervision/Verbal cueing ?LTG: Ambulation distance in home environment: 50 feet with no AD and improved L attention ?  ?Problem: RH Stairs ?Goal: LTG Patient will ambulate up and down stairs w/assist (PT) ?Description: LTG: Patient will ambulate up and down # of stairs with assistance (PT) ?Flowsheets (Taken 06/30/2021 0534) ?LTG: Pt will ambulate up/down stairs assist needed:: Supervision/Verbal cueing ?LTG: Pt will  ambulate up and down number of stairs: at least 2 steps with no HR/ AD ?  ?

## 2021-06-30 NOTE — Progress Notes (Addendum)
Advanced Heart Failure VAD Team Note  PCP-Cardiologist: Glori Bickers, MD   Subjective:    2/11 Presented with [place] 2/11 Underwent bedside ventriculostomy wit NSU 2/12 Repeat head CT with resolution of hydrocephalus. Persistent hematoma 2/17 Repeat CT head with increased hemorrhage in right lateral ventricle, slightly increased ventricular dilation.  The ventriculostomy drain is occluded.  2/18 Ventriculostomy drain replaced 2/25 CT improved ventriculomegaly. Drain pulled 2/27 F/u CT w/ stable intraparenchymal hemorrhage. Unchanged from prior.    3/1 Coumadin restarted  3/4 Confusion. Head CT no change from prior 3/6 Discharged to CIR    INR 1.7 today.   Complaining of mild headache.   LVAD INTERROGATION:  HeartMate III LVAD:   Flow 4.1 liters/min, speed 5400, power 4, PI 5.2. On batteries.   Objective:    Vital Signs:   Temp:  [97.8 F (36.6 C)-98.5 F (36.9 C)] 98.5 F (36.9 C) (03/08 0441) Pulse Rate:  [68-95] 90 (03/08 0441) Resp:  [14-16] 16 (03/08 0441) BP: (68-110)/(54-83) 99/76 (03/08 0441) SpO2:  [94 %-98 %] 97 % (03/08 0441) Weight:  [80 kg] 80 kg (03/08 0441) Last BM Date : 06/29/21 Mean arterial Pressure 79   Intake/Output:   Intake/Output Summary (Last 24 hours) at 06/30/2021 0853 Last data filed at 06/30/2021 0630 Gross per 24 hour  Intake 480 ml  Output 1000 ml  Net -520 ml     Physical Exam   Physical Exam: GENERAL: No acute distress.  HEENT: normal  NECK: Supple, JVP 5-6  .  2+ bilaterally, no bruits.  No lymphadenopathy or thyromegaly appreciated.   CARDIAC:  Mechanical heart sounds with LVAD hum present.  LUNGS:  Clear to auscultation bilaterally.  ABDOMEN:  Soft, round, nontender, positive bowel sounds x4.     LVAD exit site: well-healed and incorporated.  Dressing dry and intact.  No erythema or drainage.  Stabilization device present and accurately applied.  Driveline dressing is being changed daily per sterile technique. EXTREMITIES:   Warm and dry, no cyanosis, clubbing, rash or edema  NEUROLOGIC:  Alert and oriented x 3.    No aphasia.  No dysarthria.  Affect pleasant.      Labs   Basic Metabolic Panel: Recent Labs  Lab 06/25/21 0107 06/26/21 0044 06/27/21 0047 06/28/21 0046 06/29/21 0537 06/30/21 0623  NA 133* 134* 135 134*  --  137  K 4.2 4.1 3.8 3.7  --  4.4  CL 101 104 102 103  --  106  CO2 25 24 26 24   --  23  GLUCOSE 85 87 79 91  --  83  BUN 14 13 11 11   --  7  CREATININE 1.57* 1.38* 1.24 1.32*  --  1.45*  CALCIUM 8.5* 8.5* 8.7* 8.4*  --  8.6*  MG 1.7 1.7  --  1.8 1.6* 1.9    Liver Function Tests: No results for input(s): AST, ALT, ALKPHOS, BILITOT, PROT, ALBUMIN in the last 168 hours. No results for input(s): LIPASE, AMYLASE in the last 168 hours. No results for input(s): AMMONIA in the last 168 hours.  CBC: Recent Labs  Lab 06/24/21 0100 06/25/21 0107 06/26/21 0044 06/27/21 0047 06/28/21 0046  WBC 10.2 8.6 8.0 7.3 6.8  HGB 12.6* 12.3* 11.9* 12.1* 12.3*  HCT 39.8 37.9* 36.8* 37.9* 38.0*  MCV 84.7 83.7 83.3 84.2 83.5  PLT 200 197 185 193 183    INR: Recent Labs  Lab 06/26/21 0044 06/27/21 0047 06/28/21 0046 06/29/21 0537 06/30/21 0623  INR 1.2 1.3* 1.2 1.5*  1.7*    Other results: EKG:    Imaging   No results found.   Medications:     Scheduled Medications:  amiodarone  200 mg Oral Daily   atorvastatin  40 mg Oral Daily   Chlorhexidine Gluconate Cloth  6 each Topical Daily   feeding supplement  1 Container Oral TID BM   gabapentin  300 mg Oral BID   losartan  25 mg Oral Daily   magnesium oxide  400 mg Oral BID   mouth rinse  15 mL Mouth Rinse BID   multivitamin with minerals  1 tablet Oral Daily   pantoprazole  40 mg Oral Daily   polyethylene glycol  17 g Oral Daily   senna-docusate  2 tablet Oral QPC supper   tamsulosin  0.4 mg Oral Daily   topiramate  25 mg Oral QHS   traZODone  50 mg Oral QHS   Warfarin - Pharmacist Dosing Inpatient   Does not apply  q1600    Infusions:   PRN Medications: acetaminophen, alum & mag hydroxide-simeth, bisacodyl, diphenhydrAMINE, guaiFENesin-dextromethorphan, hydrALAZINE, ipratropium-albuterol, lidocaine, oxyCODONE, polyethylene glycol, polyvinyl alcohol, prochlorperazine **OR** prochlorperazine **OR** prochlorperazine, sodium phosphate, traMADol, traZODone    Assessment/Plan:    1. Intracerebral hemorrhage - CT brain with intracerebral and intraventricular hemorrhage with obstructive hydrocephalus. INR 2.4 - AC reversed with Kcentra/vit k in ER - Underwent bedside ventriculostomy on 2/11 - Initial ventriculostomy drain occluded. CT head 2/17 with increased hemorrhage in right lateral ventricle, slightly increased ventricular dilation.  On 2/18, ventriculostomy drain replaced.  - CT 2/25 improved ventriculomegaly. Drain pulled 2/25 - CT 2/27 stable intraparenchymal hemorrhage in the frontal lobe and basal ganglia on the right. Midline shift is slightly reduced from the prior exam. - Increased confusion on 03/04. CT with stable intraparenchymal hemorrhage.  - Continues with headache. - On coumadin.     2. Chronic systolic CHF with HM III LVAD implant 04/20/17:  - Has done well with VAD support. - He has been seen at [facility] for transplant referral but has decided not to pursue transplant. No change - New oxygen requirement with pulmonary edema on 2/17 CXR => good diuresis on 2/18.  - Volume status stable. - 2/27 and 2/28 Coumadin ordered not but given. Started 03/01. INR 1.7 today. Dosing per Pharmacy. - LDH stable.    3. Paroxysmal AFL with RVR - had DC-CV on 10/17/18 with subsequent short episodes of AF after but has been mostly in NSR on amio - Had recurrent AF in 10/22.  -not on the monitor. Rate controlled.  - Continue amio 200 mg daily - Continue coumadin.    4. h/o UGI AVM bleed - s/p APC x 4 lesions in 2/19 - No recurrent bleeding  - Continue PPI - Off ASA due to GIB    5. AKI on CKD  Stage IIIa:  - Creatinine baseline 1.3 - 1.7 - SCr 1.45 today. Stable.    6. VAD management - VAD interrogated personally. Parameters stable. - LDH has been stable.  - INR goal 1.5-2.0 Off ASA due to GIB 2/19.  - INR 2.4 reversed due to [place]. INR 1.7 today  - Continue coumadin, no heparin - Maps/LDH stable.  - DL site looks good   6. HTN - goal MAP 70-90 with ICH  - losartan increased 2/14 with resultant hypotension and AKI. Hydralazine stopped.  - Losartan decreased to 25 mg daily on 02/18 d/t bump in Scr.  - MAP stable.    7. CAD:   -  s/p PCI RCA/PLOM with DES x2 and DES to mild LAD 1/18 - No chest pain.  - Continue statin. Off ASA due to GIB   8. Urinary retention - continue Flomax  - Foley replaced 03/01   9. Hypomagnesemia - Mag 1.9. Continue Mag supp     I reviewed the LVAD parameters from today, and compared the results to the patient's prior recorded data.  No programming changes were made.  The LVAD is functioning within specified parameters.  The patient performs LVAD self-test daily.  LVAD interrogation was negative for any significant power changes, alarms or PI events/speed drops.  LVAD equipment check completed and is in good working order.  Back-up equipment present.   LVAD education done on emergency procedures and precautions and reviewed exit site care.  Length of Stay: 2  Darrick Grinder, NP 06/30/2021, 8:53 AM  VAD Team --- VAD ISSUES ONLY--- Pager [phone] (7am - 7am)  Advanced Heart Failure Team  Pager [phone] (M-F; 7a - 5p)  Please contact [facility] for night-coverage after hours (5p -7a ) and weekends on [email or web address]  Patient seen and examined with the above-signed Advanced Practice Provider and/or Housestaff. I personally reviewed laboratory data, imaging studies and relevant notes. I independently examined the patient and formulated the important aspects of the plan. I have edited the note to reflect any of my changes or salient points. I have  personally discussed the plan with the patient and/or family.  Still with mild HA. Otherwise doing well. Denies SOB, orthopnea or PND. No other neuro complaints   INR 1.7  General:  NAD.  HEENT: normal  Neck: supple. JVP not elevated.  Carotids 2+ bilat; no bruits. No lymphadenopathy or thryomegaly appreciated. Cor: LVAD hum.  Lungs: Clear. Abdomen: obese soft, nontender, non-distended. No hepatosplenomegaly. No bruits or masses. Good bowel sounds. Driveline site clean. Anchor in place.  Extremities: no cyanosis, clubbing, rash. Warm no edema  Neuro: alert & oriented x 3. No focal deficits. Moves all 4 without problem   Stable from cardiac standpoint. Getting stronger. INR at goal. Continue to supp mag.   VAD interrogated personally. Parameters stable.  Glori Bickers, MD  5:20 PM

## 2021-06-30 NOTE — Progress Notes (Signed)
Physical Therapy Session Note ? ?Patient Details  ?Name: David [name] ?MRN: [id number] ?Date of Birth: 04-02-1966 ? ?Today's Date: 06/30/2021 ?PT Individual Time: 4827-0786 ?PT Individual Time Calculation (min): 84 min  ? ?Short Term Goals: ?Week 1:  PT Short Term Goal 1 (Week 1): Pt will demonstrate standing functional transfers at supervision. ?PT Short Term Goal 2 (Week 1): Pt will ambulate with improved L foot clearance and improved attention to L with close supervision. ?PT Short Term Goal 3 (Week 1): Pt will ascend/ descend at least 4 steps using no HR with supervision. ?PT Short Term Goal 4 (Week 1): Pt will complete Functional Gait Assessment. ?PT Short Term Goal 5 (Week 1): Pt will complete Berg Balance Test. ? ? ?Skilled Therapeutic Interventions/Progress Updates:  ? ?Pt received supine in bed and agreeable to PT. PT assisted pt to disconnect wall power from LVAD to battery power.  Supine>sit transfer with supervision assist and cues for management of LVAD lines. Pt perform hygiene at sink with set up from PT and moderate cues for improved awareness of safe Lvad line management.  Pt able to maintain balance standing at sink while completing lower body and facial hygiene with appropriate sequencing. Lower body dressing for pants in standing and shoes in sitting with min assist in standing to prevent LOB to the L while donning pants. Increased cues for safety of LVAD parts through dressing and bathing.  ? ?Gait training through hall with supervision assist from PT with cues for improved awareness of the L side 220f, 1515f and 9061f ? ?Patient demonstrates increased fall risk as noted by score of   /56 on Berg Balance Scale.  (<36= high risk for falls, close to 100%; 37-45 significant >80%; 46-51 moderate >50%; 52-55 lower >25%) ?Dynamic balance training with Agility ladder 1 foot in each spaces x 4, SLS 3 sec hold in each space x 4, step in/out x 4,  side stepping  x 2 bil. CGA-min assist intermittently  with mild LOB to the L.  ? ?PT instructed pt in modified [place] A, HS curl, sit<>stand, hip abduction, mini squat. Performed x 10 BLE with cues for full ROM and improved posture. Patient returned to room and left sitting in arm chair with call bell in reach and all needs met.   ? ? ?   ? ?Therapy Documentation ?Precautions:  ?Precautions ?Precautions: Fall, Other (comment) ?Precaution Comments: LVAD (x4 year hx), L inattention ?Restrictions ?Weight Bearing Restrictions: No ? ?Pain: ?Pain Assessment ?Pain Scale: 0-10 ?Pain Score: 5  ?Pain Type: Acute pain ?Pain Location: Head ?Pain Orientation: Right;Left ?Pain Descriptors / Indicators: Headache ?Pain Intervention(s): Medication (See eMAR) ? ?   ?Balance: ?Standardized Balance Assessment ?Standardized Balance Assessment: Berg Balance Test ?BerMerrilee Janskylance Test ?Sit to Stand: Able to stand without using hands and stabilize independently ?Standing Unsupported: Able to stand safely 2 minutes ?Sitting with Back Unsupported but Feet Supported on Floor or Stool: Able to sit safely and securely 2 minutes ?Stand to Sit: Sits safely with minimal use of hands ?Transfers: Able to transfer safely, minor use of hands ?Standing Unsupported with Eyes Closed: Able to stand 10 seconds with supervision ?Standing Ubsupported with Feet Together: Able to place feet together independently and stand 1 minute safely ?From Standing, Reach Forward with Outstretched Arm: Can reach forward >12 cm safely (5") ?From Standing Position, Pick up Object from Floor: Able to pick up shoe safely and easily ?From Standing Position, Turn to Look Behind Over each Shoulder: Looks  behind one side only/other side shows less weight shift ?Turn 360 Degrees: Able to turn 360 degrees safely one side only in 4 seconds or less ?Standing Unsupported, Alternately Place Feet on Step/Stool: Able to stand independently and complete 8 steps >20 seconds ?Standing Unsupported, One Foot in Front: Able to place foot tandem  independently and hold 30 seconds ?Standing on One Leg: Able to lift leg independently and hold equal to or more than 3 seconds ?Total Score: 49 ? ?Therapy/Group: Individual Therapy ? ?Lorie Phenix ?06/30/2021, 10:18 AM  ?

## 2021-06-30 NOTE — Progress Notes (Signed)
Speech Language Pathology Daily Session Note ? ?Patient Details  ?Name: BRAYDAN MARRIOTT ?MRN: [id number] ?Date of Birth: 01/11/1966 ? ?Today's Date: 06/30/2021 ?SLP Individual Time: 8366-2947 ?SLP Individual Time Calculation (min): 45 min ? ?Short Term Goals: ?Week 1: SLP Short Term Goal 1 (Week 1): STG=LTG due to ELOS ? ?Skilled Therapeutic Interventions: Skilled ST treatment focused on cognitive goals. SLP facilitated session by providing mod A progressing to max A as session progressed for mildly complex calendar organization task. Cueing was required for working memory, alternating attention between to-do list and calendar, error awareness/correction, self monitoring, and basic problem solving (example scenario - "you have a chest x-ray on the 12th. Determine what date your follow up appointment will be if they want to see you back in 3 days). Pt required increased processing time as task progressed likely secondary to decreased sustained attention and possible cognitive fatigue. Patient was left in with alarm activated and immediate needs within reach at end of session. Continue per current plan of care.   ?   ?Pain ?Pain Assessment ?Pain Scale: 0-10 ?Pain Score: 5  ?Pain Location: Head ?Pain Orientation: Left;Right ?Pain Descriptors / Indicators: Headache ?Pain Onset: On-going ?Multiple Pain Sites: No ? ?Therapy/Group: Individual Therapy ? ?Wylan Gentzler T Zeferino Mounts ?06/30/2021, 2:09 PM ?

## 2021-06-30 NOTE — Progress Notes (Signed)
?                                                       PROGRESS NOTE ? ? ?Subjective/Complaints: ? ?Doing well with PT, mild /mod balance issues, Left neglect noted  ? ?ROS- neg CP, SOB, N/V/D ? ?Objective: ?  ?No results found. ?Recent Labs  ?  06/28/21 ?7588  ?WBC 6.8  ?HGB 12.3*  ?HCT 38.0*  ?PLT 183  ? ? ?Recent Labs  ?  06/28/21 ?3254 06/30/21 ?9826  ?NA 134* 137  ?K 3.7 4.4  ?CL 103 106  ?CO2 24 23  ?GLUCOSE 91 83  ?BUN 11 7  ?CREATININE 1.32* 1.45*  ?CALCIUM 8.4* 8.6*  ? ? ? ?Intake/Output Summary (Last 24 hours) at 06/30/2021 0902 ?Last data filed at 06/30/2021 0630 ?Gross per 24 hour  ?Intake 480 ml  ?Output 1000 ml  ?Net -520 ml  ? ?  ? ?  ? ?Physical Exam: ?Vital Signs ?Blood pressure 99/76, pulse 90, temperature 98.5 ?F (36.9 ?C), temperature source Oral, resp. rate 16, height $RemoveBe'5\' 5"'[id number]$  (1.651 m), weight 80 kg, SpO2 97 %. ? ? ?General: No acute distress ?Mood and affect are appropriate ?Heart: + LVAD hum ?Lungs: Clear to auscultation, breathing unlabored, no rales or wheezes ?Abdomen: Positive bowel sounds, soft nontender to palpation, nondistended ?Extremities: No clubbing, cyanosis, or edema ?Skin: No evidence of breakdown, no evidence of rash, RIght frontal EVD scalp wound healing well  ?Neurologic: Cranial nerves II through XII intact, motor strength is 5/5 in bilateral deltoid, bicep, tricep, grip, hip flexor, knee extensors, ankle dorsiflexor and plantar flexor ? ?Musculoskeletal: Full range of motion in all 4 extremities. No joint swelling ? ? ?Assessment/Plan: ?1. Functional deficits which require 3+ hours per day of interdisciplinary therapy in a comprehensive inpatient rehab setting. ?Physiatrist is providing close team supervision and 24 hour management of active medical problems listed below. ?Physiatrist and rehab team continue to assess barriers to discharge/monitor patient progress toward functional and medical goals ? ?Care Tool: ? ?Bathing ?   ?Body parts bathed by patient: Right arm, Left  arm, Chest, Abdomen, Front perineal area, Buttocks, Right upper leg, Left upper leg, Right lower leg, Left lower leg, Face  ?   ?  ?  ?Bathing assist Assist Level: Minimal Assistance - Patient > 75% (standing- per pt does this at home) ?  ?  ?Upper Body Dressing/Undressing ?Upper body dressing   ?What is the patient wearing?: Pull over shirt ?   ?Upper body assist Assist Level: Contact Guard/Touching assist ?   ?Lower Body Dressing/Undressing ?Lower body dressing ? ? ?   ?What is the patient wearing?: Pants, Underwear/pull up ? ?  ? ?Lower body assist Assist for lower body dressing: Minimal Assistance - Patient > 75% ?   ? ?Toileting ?Toileting    ?Toileting assist Assist for toileting: Minimal Assistance - Patient > 75% ?  ?  ?Transfers ?Chair/bed transfer ? ?Transfers assist ?   ? ?Chair/bed transfer assist level: Minimal Assistance - Patient > 75% ?  ?  ?Locomotion ?Ambulation ? ? ?Ambulation assist ? ?   ? ?Assist level: Contact Guard/Touching assist ?Assistive device: No Device ?Max distance: ~ 750 ft  ? ?Walk 10 feet activity ? ? ?Assist ?   ? ?Assist level: Contact Guard/Touching assist ?Assistive device: No Device  ? ?  Walk 50 feet activity ? ? ?Assist   ? ?Assist level: Minimal Assistance - Patient > 75% ?Assistive device: No Device  ? ? ?Walk 150 feet activity ? ? ?Assist   ? ?Assist level: Contact Guard/Touching assist ?Assistive device: No Device ?  ? ?Walk 10 feet on uneven surface  ?activity ? ? ?Assist   ? ? ?Assist level: Contact Guard/Touching assist ?Assistive device: Other (comment) (no device)  ? ?Wheelchair ? ? ? ? ?Assist Is the patient using a wheelchair?: No ?  ?Wheelchair activity did not occur: N/A ? ?  ?   ? ? ?Wheelchair 50 feet with 2 turns activity ? ? ? ?Assist ? ?  ?Wheelchair 50 feet with 2 turns activity did not occur: N/A ? ? ?   ? ?Wheelchair 150 feet activity  ? ? ? ?Assist ? Wheelchair 150 feet activity did not occur: N/A ? ? ?   ? ?Blood pressure 99/76, pulse 90, temperature  98.5 ?F (36.9 ?C), temperature source Oral, resp. rate 16, height $RemoveBe'5\' 5"'CuaqRPGHc$  (1.651 m), weight 80 kg, SpO2 97 %. ? ?Medical Problem List and Plan: ?1. 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            -patient may shower ?            -ELOS/Goals: S 10-14 days ?             Team conference today please see physician documentation under team conference tab, met with team  to discuss problems,progress, and goals. Formulized individual treatment plan based on medical history, underlying problem and comorbidities. ? ?2.  Antithrombotics: ?-DVT/anticoagulation:  Pharmaceutical: Coumadin resumed 03/01.  ?            -antiplatelet therapy: N/A--off ASA due to AVM/GIB ?3. Headaches/Pain Management:  HA since bleed ?            --oxycodone effective and being tapered to bid with ultram additionally prn. Increase  topamax $Remove'25mg'MlmmHCD$  BID ?            --gabapentin for neuropathic chest pain will d/c given trial of topirimate ?            --back pain since injury a few months ago  ?4. Mood: LCSW to follow for evaluation and support.  ?            -antipsychotic agents: N/A ?5. Neuropsych: This patient is capable of making decisions on her own behalf. ?6. Skin/Wound Care: scalp incision CDI healing well ?7. Fluids/Electrolytes/Nutrition: Strict I/O. ?-daily wts. Monitor for signs of overload.  ?8. CAD/ICM s/p LVAD/ICD: LVAD checks daily per protocol ?            --continue Cozaar, Lipitor and amiodarone. - as per CHF team  ?Vitals:  ? 06/30/21 0400 06/30/21 0441  ?BP: 100/60 99/76  ?Pulse:  90  ?Resp:  16  ?Temp:  98.5 ?F (36.9 ?C)  ?SpO2:  97%  ? ? ?9. H/o A fib: Monitor HR TID--continue amiodarone daily. Rate controlled  ?10. Urinary retention: Continue Flomax  ?--+BM 03/05-->d/c foley and monitor voiding function  ?11. Constipation: On Senna and miralax--augment as needed ?  ? ?LOS: ?2 days ?A FACE TO FACE EVALUATION WAS PERFORMED ? ?David [name] ?06/30/2021, 9:02 AM  ? ? ? ?

## 2021-06-30 NOTE — Evaluation (Addendum)
Physical Therapy Assessment and Plan  Patient Details  Name: David [name] MRN: [id number] Date of Birth: [birth year]  PT Diagnosis: Difficulty walking, Hemiparesis non-dominant, Impaired cognition, Muscle weakness, and Pain in Head Rehab Potential: Good ELOS: 10-14 days   Today's Date: 06/29/2021 PT Individual Time: 0905-1010 PT Individual Time Calculation (min): 65 min       Hospital Problem: Principal Problem:   ICH (intracerebral hemorrhage) ([place]) Active Problems:   LVAD (left ventricular assist device) present [facility])   Past Medical History:  Past Medical History:  Diagnosis Date   AICD (automatic cardioverter/defibrillator) present    Anemia    Anxiety    CAD (coronary artery disease) 2009   AMI in 12/2007 with PCI to LAD, staged PCI to  mid/distal RCA, NSTEMI in 02/2009 with BMS to LCx   CHF (congestive heart failure) ([place])    Chronic kidney disease 11/03/2016   stage 3 kidney disease   Dysrhythmia    "arrythmia problems at some point", "fatal rhythms"   History of blood transfusion    "I was bleeding from was rectum" (04/19/2017)   HLD (hyperlipidemia)    HTN (hypertension)    Hyperlipidemia 03/15/2011   Ischemic cardiomyopathy    Admitted in 07/2010 with CHF exacerbation   Myocardial infarction ([place])    "I've had 4" (04/19/2017)   Pneumonia 2018 X 1   Seizures ([place])    one seizure in 04/2016 during cardiac event (04/19/2017)   STEMI 2009 (anterior), 2010 (lateral), and 2012 (inferior) 03/14/2011   Past Surgical History:  Past Surgical History:  Procedure Laterality Date   AORTIC VALVE REPAIR N/A 04/20/2017   Procedure: AORTIC VALVE REPAIR;  Surgeon: Gaye Pollack, MD;  Location: [place];  Service: Open Heart Surgery;  Laterality: N/A;   BIV ICD INSERTION CRT-D N/A 08/01/2016   Procedure: BiV ICD Insertion CRT-D;  Surgeon: Will Meredith Leeds, MD;  Location: [place] CV LAB;  Service: Cardiovascular;  Laterality: N/A;   CARDIAC CATHETERIZATION N/A 05/05/2016    Procedure: Right/Left Heart Cath and Coronary Angiography;  Surgeon: Jolaine Artist, MD;  Location: [place] CV LAB;  Service: Cardiovascular;  Laterality: N/A;   CARDIAC CATHETERIZATION N/A 05/09/2016   Procedure: Coronary Stent Intervention;  Surgeon: Peter M Martinique, MD;  Location: [place] CV LAB;  Service: Cardiovascular;  Laterality: N/A;   CARDIAC CATHETERIZATION N/A 05/09/2016   Procedure: Intravascular Pressure Wire/FFR Study;  Surgeon: Peter M Martinique, MD;  Location: [place] CV LAB;  Service: Cardiovascular;  Laterality: N/A;   CARDIOVERSION N/A 10/17/2018   Procedure: CARDIOVERSION;  Surgeon: Jolaine Artist, MD;  Location: [place];  Service: Cardiovascular;  Laterality: N/A;   CORONARY ANGIOPLASTY WITH STENT PLACEMENT     "i've got a total of 12 stents" (04/19/2017)   ELECTROPHYSIOLOGY STUDY N/A 06/24/2016   Procedure: Electrophysiology Study;  Surgeon: Deboraha Sprang, MD;  Location: [place] CV LAB;  Service: Cardiovascular;  Laterality: N/A;   ENTEROSCOPY N/A 06/03/2017   Procedure: ENTEROSCOPY;  Surgeon: Yetta Flock, MD;  Location: [facility] ENDOSCOPY;  Service: Gastroenterology;  Laterality: N/A;   EPICARDIAL PACING LEAD PLACEMENT Left 11/07/2016   Procedure: LV EPICARDIAL PACING LEAD PLACEMENT VIA LEFT MINI THORACOTOMY;  Surgeon: Gaye Pollack, MD;  Location: [place];  Service: Thoracic;  Laterality: Left;   ICD IMPLANT N/A 06/24/2016   Procedure: POSSIBLE  ICD Implant;  Surgeon: Deboraha Sprang, MD;  Location: [place] CV LAB;  Service: Cardiovascular;  Laterality: N/A;   INSERTION OF  IMPLANTABLE LEFT VENTRICULAR ASSIST DEVICE N/A 04/20/2017   Procedure: INSERTION OF IMPLANTABLE LEFT VENTRICULAR ASSIST DEVICE, Aortic Valve repair;  Surgeon: Gaye Pollack, MD;  Location: [facility] OR;  Service: Open Heart Surgery;  Laterality: N/A;  Heartmate 3   IR FLUORO GUIDE CV LINE RIGHT  05/08/2017   IR FLUORO GUIDE CV MIDLINE PICC RIGHT  03/14/2017   IR REMOVAL TUN CV CATH  W/O FL  05/25/2017   IR US GUIDE VASC ACCESS RIGHT  03/14/2017   IR US GUIDE VASC ACCESS RIGHT  05/08/2017   LEFT HEART CATHETERIZATION WITH CORONARY ANGIOGRAM N/A 03/13/2011   Procedure: LEFT HEART CATHETERIZATION WITH CORONARY ANGIOGRAM;  Surgeon: Lorretta Harp, MD;  Location: [facility] CATH LAB;  Service: Cardiovascular;  Laterality: N/A;   MULTIPLE EXTRACTIONS WITH ALVEOLOPLASTY N/A 04/12/2017   Procedure: Extraction of tooth #'s 2, 5-12, 17, 20-29 with alveoloplasty amd maxillary right buccal exostoses reductions.;  Surgeon: Lenn Cal, DDS;  Location: [place];  Service: Oral Surgery;  Laterality: N/A;   RIGHT HEART CATH N/A 04/19/2017   Procedure: RIGHT HEART CATH;  Surgeon: Jolaine Artist, MD;  Location: [place] CV LAB;  Service: Cardiovascular;  Laterality: N/A;   RIGHT/LEFT HEART CATH AND CORONARY ANGIOGRAPHY N/A 03/10/2017   Procedure: RIGHT/LEFT HEART CATH AND CORONARY ANGIOGRAPHY;  Surgeon: Jolaine Artist, MD;  Location: [place] CV LAB;  Service: Cardiovascular;  Laterality: N/A;   TEE WITHOUT CARDIOVERSION N/A 04/20/2017   Procedure: TRANSESOPHAGEAL ECHOCARDIOGRAM (TEE);  Surgeon: Gaye Pollack, MD;  Location: [place];  Service: Open Heart Surgery;  Laterality: N/A;   TEE WITHOUT CARDIOVERSION N/A 10/17/2018   Procedure: TRANSESOPHAGEAL ECHOCARDIOGRAM (TEE);  Surgeon: Jolaine Artist, MD;  Location: [facility] ENDOSCOPY;  Service: Cardiovascular;  Laterality: N/A;    Assessment & Plan Clinical Impression: Patient is a 56 y.o.  male with history of CAD s/p ICD,  LVAD HM III12/19, gastric AVMs, LBP who  was admitted on 06/05/21 with reports of neck pain with worsening HA progressing to somnolence. He was found to have acute hemorrhage in right caudothalamic with intraventricular clot in right lateral ventricle to 4th ventricle with obstructive changes and asymmetric ballooning of right sided horns. INR 2.4. Coumadin reversed with Kcentra and EVD placed by Dr. Arnoldo Morale with  improvement in HA and mentation.  Serial CT head showed resolution of hydrocephalus but increase in bleed as well as occlusion of ventriculostomy drain which was replaced on 02/18. He has had issues with intermittent confusion, hypoxia due to fluid overload requiring supplemental oxygen as well as hypotension with acute on chronic renal failure.    EVD removed on 02/25 and neurologically stable therefore NS signed off with recs to follow up on outpatient basis. Coumadin resumed on 02/28 and he did have worsening of HA with fatigue on as well as urinary retention requiring foley placement.  He had worsening of mental status with confusion and restlessness on 03/04. CT head repeated showing no significant change in size of large IVH with 0.9 cm left to right midline shift. He was found to have limitations due to HA, cognitive deficits as well as debility. CIR was recommended due to functional decline. His wife is already well trained with LVAD.  Patient transferred to CIR on 06/28/2021 .   Patient currently requires CGA/min assist with mobility secondary to muscle weakness, decreased cardiorespiratoy endurance, unbalanced muscle activation and decreased coordination, decreased visual perceptual skills and field cut, decreased attention to left, decreased attention, decreased awareness, decreased safety awareness,  decreased memory, and delayed processing, and decreased balance strategies and hemipareisis .  Prior to hospitalization, patient was independent  with mobility and lived with Spouse in a [facility].  Home access is 2Stairs to enter.  Patient will benefit from skilled PT intervention to maximize safe functional mobility, minimize fall risk, and decrease caregiver burden for planned discharge home with 24 hour supervision.  Anticipate patient will benefit from follow up OP vs HH dependent on progress at discharge.  PT - End of Session Activity Tolerance: Tolerates 30+ min activity with multiple  rests Endurance Deficit: Yes PT Assessment Rehab Potential (ACUTE/IP ONLY): Good PT Barriers to Discharge: Decreased caregiver support;Lack of/limited family support;Insurance for SNF coverage;Other (comments) (cognition/ memory) PT Plan PT Intensity: Minimum of 1-2 x/day ,45 to 90 minutes PT Frequency: 5 out of 7 days PT Duration Estimated Length of Stay: 10-14 days PT Treatment/Interventions: Discharge planning;Ambulation/gait training;Functional mobility training;Psychosocial support;Therapeutic Activities;Visual/perceptual remediation/compensation;Balance/vestibular training;Disease management/prevention;Neuromuscular re-education;Skin care/wound management;Therapeutic Exercise;Wheelchair propulsion/positioning;UE/LE Strength taining/ROM;Splinting/orthotics;Pain management;DME/adaptive equipment instruction;Cognitive remediation/compensation;Community reintegration;Patient/family education;Stair training;UE/LE Coordination activities PT Recommendation Recommendations for Other Services: Therapeutic Recreation consult Therapeutic Recreation Interventions: Stress management;Outing/community reintergration Follow Up Recommendations: Home health PT;Outpatient PT;24 hour supervision/assistance Patient destination: Home Equipment Recommended: To be determined   PT Evaluation Precautions/Restrictions Precautions Precautions: Fall;Other (comment) Precaution Comments: LVAD (x4 year hx), L inattention Restrictions Weight Bearing Restrictions: No General   Vital SignsTherapy Vitals BP: (!) 101/54 Pain Pain Assessment Pain Scale: 0-10 Pain Score: 5  Pain Type: Acute pain Pain Location: Head Pain Descriptors / Indicators: Aching;Headache Pain Onset: On-going Patients Stated Pain Goal: 0 Pain Intervention(s): Medication (See eMAR);Repositioned Pain Interference Pain Interference Pain Effect on Sleep: 3. Frequently Pain Interference with Therapy Activities: 2. Occasionally Pain  Interference with Day-to-Day Activities: 3. Frequently Home Living/Prior Functioning Home Living Available Help at Discharge: Family;Available 24 hours/day Home Access: Stairs to enter Entrance Stairs-Number of Steps: 2 Home Layout: One level Bathroom Shower/Tub: Sponge bathes at baseline Bathroom Toilet: Standard Bathroom Accessibility: Yes  Lives With: Spouse Prior Function Level of Independence: Independent with basic ADLs;Independent with homemaking with ambulation  Able to Take Stairs?: Reciprically Vocation: On disability Vision/Perception  Vision - History Ability to See in Adequate Light: 1 Impaired Perception Perception: Impaired Inattention/Neglect: Does not attend to left visual field;Does not attend to left side of body Praxis Praxis: Impaired Praxis-Other Comments: is able to demonstrate change of LVAD to/ from battery power, but demos difficulty donning battery vest  Cognition Overall Cognitive Status: Impaired/Different from baseline Arousal/Alertness: Awake/alert Orientation Level: Oriented X4 Memory: Impaired Memory Impairment: Decreased recall of new information;Retrieval deficit Awareness: Impaired Problem Solving: Impaired Behaviors: Impulsive (mild) Safety/Judgment: Impaired Sensation Sensation Light Touch: Appears Intact Coordination Gross Motor Movements are Fluid and Coordinated: Yes Fine Motor Movements are Fluid and Coordinated: No Finger Nose Finger Test: minimally slower LUE Heel Shin Test: WFL RLE, WFL but slower on LLE Motor  Motor Motor: Other (comment) (hemipareisis) Motor - Skilled Clinical Observations: very mild L hemi   Trunk/Postural Assessment  Cervical Assessment Cervical Assessment: Within Functional Limits Thoracic Assessment Thoracic Assessment: Exceptions to [facility] (rounded shoulders) Lumbar Assessment Lumbar Assessment: Within Functional Limits Postural Control Postural Control: Within Functional Limits   Balance Balance Balance Assessed: Yes Dynamic Sitting Balance Dynamic Sitting - Balance Support: No upper extremity supported;Feet supported Dynamic Sitting - Level of Assistance: 5: Stand by assistance Static Standing Balance Static Standing - Balance Support: No upper extremity supported;During functional activity Static Standing - Level of Assistance: 5: Stand by assistance Dynamic Standing Balance  Dynamic Standing - Balance Support: No upper extremity supported;During functional activity Dynamic Standing - Level of Assistance: 4: Min assist;5: Stand by assistance (CGA) Extremity Assessment      RLE Assessment RLE Assessment: Within Functional Limits General Strength Comments: 4 to 4+/ 5 prox to distal LLE Assessment LLE Assessment: Exceptions to [facility] General Strength Comments: 4 to 4+/ 5 prox to distal except for 4-/ 5 for knee flexion  Care Tool Care Tool Bed Mobility Roll left and right activity   Roll left and right assist level: Independent with assistive device    Sit to lying activity   Sit to lying assist level: Supervision/Verbal cueing    Lying to sitting on side of bed activity   Lying to sitting on side of bed assist level: the ability to move from lying on the back to sitting on the side of the bed with no back support.: Supervision/Verbal cueing     Care Tool Transfers Sit to stand transfer   Sit to stand assist level: Minimal Assistance - Patient > 75%    Chair/bed transfer   Chair/bed transfer assist level: Minimal Assistance - Patient > 75%     Toilet transfer   Assist Level: Minimal Assistance - Patient > 75%    Car transfer   Car transfer assist level: Contact Guard/Touching assist      Care Tool Locomotion Ambulation   Assist level: Contact Guard/Touching assist Assistive device: No Device Max distance: ~ 750 ft  Walk 10 feet activity   Assist level: Contact Guard/Touching assist Assistive device: No Device   Walk 50 feet with 2 turns  activity   Assist level: Minimal Assistance - Patient > 75% Assistive device: No Device  Walk 150 feet activity   Assist level: Contact Guard/Touching assist Assistive device: No Device  Walk 10 feet on uneven surfaces activity   Assist level: Contact Guard/Touching assist Assistive device: Other (comment) (no device)  Stairs   Assist level: Contact Guard/Touching assist Stairs assistive device: 2 hand rails Max number of stairs: 4  Walk up/down 1 step activity     Walk up/down 1 step or curb assistive device: 2 hand rails  Walk up/down 4 steps activity   Walk up/down 4 steps assist level: Supervision/Verbal cueing Walk up/down 4 steps assistive device: 2 hand rails  Walk up/down 12 steps activity   Walk up/down 12 steps assist level: Contact Guard/Touching assist    Pick up small objects from floor Pick up small object from the floor (from standing position) activity did not occur: Safety/medical concerns Pick up small object from the floor assist level: Minimal Assistance - Patient > 75%    Wheelchair Is the patient using a wheelchair?: No   Wheelchair activity did not occur: N/A      Wheel 50 feet with 2 turns activity Wheelchair 50 feet with 2 turns activity did not occur: N/A    Wheel 150 feet activity Wheelchair 150 feet activity did not occur: N/A      Refer to Care Plan for Long Term Goals  SHORT TERM GOAL WEEK 1 PT Short Term Goal 1 (Week 1): Pt will demonstrate standing functional transfers at supervision. PT Short Term Goal 2 (Week 1): Pt will ambulate with improved L foot clearance and improved attention to L with close supervision. PT Short Term Goal 3 (Week 1): Pt will ascend/ descend at least 4 steps using no HR with supervision. PT Short Term Goal 4 (Week 1): Pt will complete Functional Gait Assessment.  PT Short Term Goal 5 (Week 1): Pt will complete Berg Balance Test.  Recommendations for other services: Therapeutic Recreation  Stress management and  Outing/community reintegration  Skilled Therapeutic Intervention Mobility Bed Mobility Bed Mobility: Sit to Supine;Supine to Sit Supine to Sit: Supervision/Verbal cueing Sit to Supine: Supervision/Verbal cueing Transfers Transfers: Sit to Stand;Stand to Sit;Stand Pivot Transfers Sit to Stand: Minimal Assistance - Patient > 75%;Contact Guard/Touching assist Stand to Sit: Minimal Assistance - Patient > 75%;Contact Guard/Touching assist Stand Pivot Transfers: Minimal Assistance - Patient > 75%;Contact Guard/Touching assist Stand Pivot Transfer Details: Verbal cues for precautions/safety Locomotion  Gait Ambulation: Yes Gait Assistance: Contact Guard/Touching assist;Supervision/Verbal cueing Assistive device: None Gait Assistance Details: Verbal cues for precautions/safety;Visual cues/gestures for precautions/safety Gait Gait: Yes Gait Pattern: Impaired Gait Pattern: Poor foot clearance - left Gait velocity: decreased Stairs / Additional Locomotion Stairs: Yes Stairs Assistance: Contact Guard/Touching assist;Supervision/Verbal cueing Stair Management Technique: Two rails Height of Stairs: 6 Ramp: Supervision/Verbal cueing Curb: Supervision/Verbal cueing Wheelchair Mobility Wheelchair Mobility: No  Skilled Therapeutic Interventions: PT Evaluation completed; see above for results. PT educated patient in roles of PT vs OT vs ST, PT noted impairments and POC, rehab potential, rehab goals, and discharge recommendations along with recommendation for follow-up rehabilitation services. Individual treatment initiated:  Patient seated on EOB upon PT arrival with RN present and providing morning medications. Patient alert and agreeable to PT session. No O2 donned with pt relating having worn it overnight. No orders seen in chart for supplemental O2.   Only pain complaint during session is for headache that is unrelenting and rated at 5/ 10 on pain scale.  Therapeutic Activity: Bed  Mobility: Patient performed sit --> supine with supervision.   Pt is able to teach back/ demonstrate correct steps to adjust LVAD to battery power and back to main power at end of session.  Transfers: Patient performed standing transfers with overall CGA and min instances for light minA with no AD.  Provided vc/tc for reaching back to seat for controlled descent. Pt performs car transfer with hold onto frame and stepping L foot into footwell first, then sliding into seat. Provided with CGA/ light MinA.   Gait Training:  Patient ambulated 748  feet during 6MWT using no AD with overall close supervision and CGA with consistent vc/tc for attending to obstacles and people to L side of hallway. Crowds L side and requires tactile redirecting to avoid people. Allowed to fail and brush doorframes and other obstacles in order to improve pt's awareness and acceptance of visual field deficits/ inattention.   Neuromuscular Re-ed: NMR facilitated during session with focus on standing balance and L attention. Pt guided in search for items to L in hallway for dual tasking and attempt to increase pt's awareness of L inattention. Pt with difficulty finding called items without cueing to turn head. NMR performed for improvements in motor control and coordination, balance, sequencing, judgement, and self confidence/ efficacy in performing all aspects of mobility at highest level of independence.   Patient supine  in bed at end of session with brakes locked, bed alarm set, and all needs within reach.   Pt complains of headache throughout session but also relates that no mobility performance makes pain worse - remains consistent in pain level.   Discharge Criteria: Patient will be discharged from PT if patient refuses treatment 3 consecutive times without medical reason, if treatment goals not met, if there is a change in medical status, if patient makes no progress towards goals or  if patient is discharged from  hospital.  The above assessment, treatment plan, treatment alternatives and goals were discussed and mutually agreed upon: by patient  Alger Simons PT, DPT 06/29/2021, 5:49 PM

## 2021-07-01 DIAGNOSIS — I609 Nontraumatic subarachnoid hemorrhage, unspecified: Secondary | ICD-10-CM

## 2021-07-01 LAB — PROTIME-INR
INR: 1.7 — ABNORMAL HIGH (ref 0.8–1.2)
Prothrombin Time: 20.3 seconds — ABNORMAL HIGH (ref 11.4–15.2)

## 2021-07-01 LAB — BASIC METABOLIC PANEL
Anion gap: 8 (ref 5–15)
BUN: 9 mg/dL (ref 6–20)
CO2: 24 mmol/L (ref 22–32)
Calcium: 8.6 mg/dL — ABNORMAL LOW (ref 8.9–10.3)
Chloride: 104 mmol/L (ref 98–111)
Creatinine, Ser: 1.44 mg/dL — ABNORMAL HIGH (ref 0.61–1.24)
GFR, Estimated: 57 mL/min — ABNORMAL LOW (ref >60)
Glucose, Bld: 82 mg/dL (ref 70–99)
Potassium: 3.8 mmol/L (ref 3.5–5.1)
Sodium: 136 mmol/L (ref 135–145)

## 2021-07-01 LAB — LACTATE DEHYDROGENASE: LDH: 166 U/L (ref 98–192)

## 2021-07-01 MED ORDER — POTASSIUM CHLORIDE CRYS ER 20 MEQ PO TBCR
20.0000 meq | EXTENDED_RELEASE_TABLET | Freq: Once | ORAL | Status: AC
  Administered 2021-07-01: 15:00:00 20 meq via ORAL
  Filled 2021-07-01: qty 1

## 2021-07-01 MED ORDER — TOPIRAMATE 25 MG PO TABS
50.0000 mg | ORAL_TABLET | Freq: Two times a day (BID) | ORAL | Status: DC
  Administered 2021-07-01 – 2021-07-06 (×11): 50 mg via ORAL
  Filled 2021-07-01 (×11): qty 2

## 2021-07-01 MED ORDER — SORBITOL 70 % SOLN
30.0000 mL | Freq: Every day | Status: DC | PRN
  Filled 2021-07-01: qty 30

## 2021-07-01 MED ORDER — WARFARIN SODIUM 1 MG PO TABS
1.5000 mg | ORAL_TABLET | Freq: Once | ORAL | Status: AC
  Administered 2021-07-01: 17:00:00 1.5 mg via ORAL
  Filled 2021-07-01: qty 1

## 2021-07-01 NOTE — Progress Notes (Signed)
?                                                       PROGRESS NOTE ? ? ?Subjective/Complaints: ? ?No issues overnite , awoke with HA ~2am, discussed gradual escalation of topirimate dose  ?Discussed D/C date  ? ?ROS- neg CP, SOB, N/V/D ? ?Objective: ?  ?No results found. ?No results for input(s): WBC, HGB, HCT, PLT in the last 72 hours. ? ?Recent Labs  ?  06/30/21 ?0623 07/01/21 ?0559  ?NA 137 136  ?K 4.4 3.8  ?CL 106 104  ?CO2 23 24  ?GLUCOSE 83 82  ?BUN 7 9  ?CREATININE 1.45* 1.44*  ?CALCIUM 8.6* 8.6*  ? ? ? ?Intake/Output Summary (Last 24 hours) at 07/01/2021 0738 ?Last data filed at 07/01/2021 0737 ?Gross per 24 hour  ?Intake 480 ml  ?Output --  ?Net 480 ml  ? ?  ? ?  ? ?Physical Exam: ?Vital Signs ?Blood pressure 97/71, pulse (!) 59, temperature 97.9 ?F (36.6 ?C), temperature source Oral, resp. rate 16, height 5\' 5"  (1.651 m), weight 79.8 kg, SpO2 96 %. ? ? ?General: No acute distress ?Mood and affect are appropriate ?Heart: + LVAD hum ?Lungs: Clear to auscultation, breathing unlabored, no rales or wheezes ?Abdomen: Positive bowel sounds, soft nontender to palpation, nondistended ?Extremities: No clubbing, cyanosis, or edema ?Skin: No evidence of breakdown, no evidence of rash, RIght frontal EVD scalp wound healing well  ?Neurologic: Cranial nerves II through XII intact, motor strength is 5/5 in bilateral deltoid, bicep, tricep, grip, hip flexor, knee extensors, ankle dorsiflexor and plantar flexor ? ?Musculoskeletal: Full range of motion in all 4 extremities. No joint swelling ? ? ?Assessment/Plan: ?1. Functional deficits which require 3+ hours per day of interdisciplinary therapy in a comprehensive inpatient rehab setting. ?Physiatrist is providing close team supervision and 24 hour management of active medical problems listed below. ?Physiatrist and rehab team continue to assess barriers to discharge/monitor patient progress toward functional and medical goals ? ?Care Tool: ? ?Bathing ?   ?Body parts bathed  by patient: Right arm, Left arm, Chest, Abdomen, Front perineal area, Buttocks, Right upper leg, Left upper leg, Right lower leg, Left lower leg, Face  ?   ?  ?  ?Bathing assist Assist Level: Minimal Assistance - Patient > 75% (standing- per pt does this at home) ?  ?  ?Upper Body Dressing/Undressing ?Upper body dressing   ?What is the patient wearing?: Pull over shirt ?   ?Upper body assist Assist Level: Contact Guard/Touching assist ?   ?Lower Body Dressing/Undressing ?Lower body dressing ? ? ?   ?What is the patient wearing?: Pants, Underwear/pull up ? ?  ? ?Lower body assist Assist for lower body dressing: Minimal Assistance - Patient > 75% ?   ? ?Toileting ?Toileting    ?Toileting assist Assist for toileting: Contact Guard/Touching assist ?  ?  ?Transfers ?Chair/bed transfer ? ?Transfers assist ?   ? ?Chair/bed transfer assist level: Contact Guard/Touching assist ?  ?  ?Locomotion ?Ambulation ? ? ?Ambulation assist ? ?   ? ?Assist level: Contact Guard/Touching assist ?Assistive device: No Device ?Max distance: ~ 750 ft  ? ?Walk 10 feet activity ? ? ?Assist ?   ? ?Assist level: Contact Guard/Touching assist ?Assistive device: No Device  ? ?Walk 50 feet activity ? ? ?  Assist   ? ?Assist level: Minimal Assistance - Patient > 75% ?Assistive device: No Device  ? ? ?Walk 150 feet activity ? ? ?Assist   ? ?Assist level: Contact Guard/Touching assist ?Assistive device: No Device ?  ? ?Walk 10 feet on uneven surface  ?activity ? ? ?Assist   ? ? ?Assist level: Contact Guard/Touching assist ?Assistive device: Other (comment) (no device)  ? ?Wheelchair ? ? ? ? ?Assist Is the patient using a wheelchair?: No ?  ?  ? ?  ?   ? ? ?Wheelchair 50 feet with 2 turns activity ? ? ? ?Assist ? ?  ?  ? ? ?   ? ?Wheelchair 150 feet activity  ? ? ? ?Assist ?   ? ? ?   ? ?Blood pressure 97/71, pulse (!) 59, temperature 97.9 ?F (36.6 ?C), temperature source Oral, resp. rate 16, height 5\' 5"  (1.651 m), weight 79.8 kg, SpO2 96 %. ? ?Medical  Problem List and Plan: ?1. 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            -patient may shower ?            -ELOS/Goals: S 07/10/21 ?             ?2.  Antithrombotics: ?-DVT/anticoagulation:  Pharmaceutical: Coumadin resumed 03/01.  ?            -antiplatelet therapy: N/A--off ASA due to AVM/GIB ?3. Headaches/Pain Management:  HA since bleed ?            --oxycodone effective and being tapered to bid with ultram additionally prn. Increase  topamax 50mg  BID ?            --gabapentin for neuropathic chest pain will d/c given trial of topirimate ?            --back pain since injury a few months ago  ?4. Mood: LCSW to follow for evaluation and support.  ?            -antipsychotic agents: N/A ?5. Neuropsych: This patient is capable of making decisions on her own behalf. ?6. Skin/Wound Care: scalp incision CDI healing well ?7. Fluids/Electrolytes/Nutrition: Strict I/O. ?-daily wts. Monitor for signs of overload.  ?8. CAD/ICM s/p LVAD/ICD: LVAD checks daily per protocol ?            --continue Cozaar, Lipitor and amiodarone. - as per CHF team  ?Vitals:  ? 07/01/21 0400 07/01/21 0454  ?BP: 101/80 97/71  ?Pulse:  (!) 59  ?Resp:  16  ?Temp:  97.9 ?F (36.6 ?C)  ?SpO2:  96%  ? ? ?9. H/o A fib: Monitor HR TID--continue amiodarone daily. Rate controlled  ?10. Urinary retention: Continue Flomax  ?--+BM 03/05-->d/c foley and monitor voiding function  ?11. Constipation: On Senna and miralax--augment as needed ?  ? ?LOS: ?3 days ?A FACE TO FACE EVALUATION WAS PERFORMED ? ?Luanna Salk Gery Sabedra ?07/01/2021, 7:38 AM  ? ? ? ?

## 2021-07-01 NOTE — Progress Notes (Addendum)
Advanced Heart Failure VAD Team Note  PCP-Cardiologist: Glori Bickers, MD   Subjective:    2/11 Presented with [place] 2/11 Underwent bedside ventriculostomy wit NSU 2/12 Repeat head CT with resolution of hydrocephalus. Persistent hematoma 2/17 Repeat CT head with increased hemorrhage in right lateral ventricle, slightly increased ventricular dilation.  The ventriculostomy drain is occluded.  2/18 Ventriculostomy drain replaced 2/25 CT improved ventriculomegaly. Drain pulled 2/27 F/u CT w/ stable intraparenchymal hemorrhage. Unchanged from prior.    3/1 Coumadin restarted  3/4 Confusion. Head CT no change from prior 3/6 Discharged to CIR    INR 1.7 today.   LDH stable, 166  MAP 81   Continues w/ HA. Feels he is making progress w/ therapy. Getting stronger. No dyspnea. Denies CP.   Family at bedside.    LVAD INTERROGATION:  HeartMate III LVAD:   Flow 4.5 liters/min, speed 5400, power 4.1 PI 4.7. On batteries.   Objective:    Vital Signs:   Temp:  [97.7 F (36.5 C)-98 F (36.7 C)] 97.9 F (36.6 C) (03/09 0454) Pulse Rate:  [59-81] 59 (03/09 0454) Resp:  [16-17] 16 (03/09 0454) BP: (88-102)/(71-91) 97/71 (03/09 0454) SpO2:  [96 %-97 %] 96 % (03/09 0454) Weight:  [79.8 kg] 79.8 kg (03/09 0454) Last BM Date : 06/27/21 Mean arterial Pressure 80s   Intake/Output:   Intake/Output Summary (Last 24 hours) at 07/01/2021 1117 Last data filed at 07/01/2021 0737 Gross per 24 hour  Intake 360 ml  Output --  Net 360 ml     Physical Exam   GENERAL: well appearing, laying in bed. No acute distress.  HEENT: normal  NECK: Supple, JVP 5 cm  .  2+ bilaterally, no bruits.  No lymphadenopathy or thyromegaly appreciated.   CARDIAC:  Mechanical heart sounds with LVAD hum present.  LUNGS:  CTAB  ABDOMEN:  Soft, round, nontender, positive bowel sounds x4.     LVAD exit site: well-healed and incorporated.  Dressing dry and intact.  No erythema or drainage.  Stabilization device  present and accurately applied.  Driveline dressing is being changed daily per sterile technique. EXTREMITIES:  Warm and dry, no cyanosis, clubbing, rash or edema  NEUROLOGIC:  Alert and oriented x 3.    No aphasia.  No dysarthria.  Affect pleasant.     Labs   Basic Metabolic Panel: Recent Labs  Lab 06/25/21 0107 06/26/21 0044 06/27/21 0047 06/28/21 0046 06/29/21 0537 06/30/21 0623 07/01/21 0559  NA 133* 134* 135 134*  --  137 136  K 4.2 4.1 3.8 3.7  --  4.4 3.8  CL 101 104 102 103  --  106 104  CO2 25 24 26 24   --  23 24  GLUCOSE 85 87 79 91  --  83 82  BUN 14 13 11 11   --  7 9  CREATININE 1.57* 1.38* 1.24 1.32*  --  1.45* 1.44*  CALCIUM 8.5* 8.5* 8.7* 8.4*  --  8.6* 8.6*  MG 1.7 1.7  --  1.8 1.6* 1.9  --     Liver Function Tests: No results for input(s): AST, ALT, ALKPHOS, BILITOT, PROT, ALBUMIN in the last 168 hours. No results for input(s): LIPASE, AMYLASE in the last 168 hours. No results for input(s): AMMONIA in the last 168 hours.  CBC: Recent Labs  Lab 06/25/21 0107 06/26/21 0044 06/27/21 0047 06/28/21 0046  WBC 8.6 8.0 7.3 6.8  HGB 12.3* 11.9* 12.1* 12.3*  HCT 37.9* 36.8* 37.9* 38.0*  MCV 83.7 83.3 84.2  83.5  PLT 197 185 193 183    INR: Recent Labs  Lab 06/27/21 0047 06/28/21 0046 06/29/21 0537 06/30/21 0623 07/01/21 0559  INR 1.3* 1.2 1.5* 1.7* 1.7*    Other results: EKG:    Imaging   No results found.   Medications:     Scheduled Medications:  amiodarone  200 mg Oral Daily   atorvastatin  40 mg Oral Daily   Chlorhexidine Gluconate Cloth  6 each Topical Daily   feeding supplement  1 Container Oral TID BM   losartan  25 mg Oral Daily   magnesium oxide  400 mg Oral BID   mouth rinse  15 mL Mouth Rinse BID   multivitamin with minerals  1 tablet Oral Daily   pantoprazole  40 mg Oral Daily   polyethylene glycol  17 g Oral Daily   senna-docusate  2 tablet Oral QPC supper   tamsulosin  0.4 mg Oral Daily   topiramate  50 mg Oral BID    traZODone  50 mg Oral QHS   Warfarin - Pharmacist Dosing Inpatient   Does not apply q1600    Infusions:   PRN Medications: acetaminophen, alum & mag hydroxide-simeth, bisacodyl, diphenhydrAMINE, guaiFENesin-dextromethorphan, hydrALAZINE, ipratropium-albuterol, lidocaine, oxyCODONE, polyethylene glycol, polyvinyl alcohol, prochlorperazine **OR** prochlorperazine **OR** prochlorperazine, sodium phosphate, traMADol, traZODone    Assessment/Plan:    1. Intracerebral hemorrhage - CT brain with intracerebral and intraventricular hemorrhage with obstructive hydrocephalus. INR 2.4 - AC reversed with Kcentra/vit k in ER - Underwent bedside ventriculostomy on 2/11 - Initial ventriculostomy drain occluded. CT head 2/17 with increased hemorrhage in right lateral ventricle, slightly increased ventricular dilation.  On 2/18, ventriculostomy drain replaced.  - CT 2/25 improved ventriculomegaly. Drain pulled 2/25 - CT 2/27 stable intraparenchymal hemorrhage in the frontal lobe and basal ganglia on the right. Midline shift is slightly reduced from the prior exam. - Increased confusion on 03/04. CT with stable intraparenchymal hemorrhage.  - Continues with headache. - On coumadin.     2. Chronic systolic CHF with HM III LVAD implant 04/20/17:  - Has done well with VAD support. - He has been seen at [facility] for transplant referral but has decided not to pursue transplant. No change - New oxygen requirement with pulmonary edema on 2/17 CXR => good diuresis on 2/18.  - Volume status stable. - 2/27 and 2/28 Coumadin ordered not but given. Started 03/01. INR 1.7 today. Dosing per Pharmacy. - LDH stable.    3. Paroxysmal AFL with RVR - had DC-CV on 10/17/18 with subsequent short episodes of AF after but has been mostly in NSR on amio - Had recurrent AF in 10/22.  - not on the monitor. Rate controlled.  - Continue amio 200 mg daily - Continue coumadin.    4. h/o UGI AVM bleed - s/p APC x 4 lesions in  2/19 - No recurrent bleeding  - Continue PPI - Off ASA due to GIB    5. AKI on CKD Stage IIIa:  - Creatinine baseline 1.3 - 1.7 - SCr 1.44 today. Stable.    6. VAD management - VAD interrogated personally. Parameters stable. - LDH has been stable.  - INR goal 1.5-2.0 Off ASA due to GIB 2/19.  - INR 2.4 reversed due to [place]. INR 1.7 today  - Continue coumadin, no heparin - Maps/LDH stable.  - DL site looks good   6. HTN - goal MAP 70-90 with ICH  - losartan increased 2/14 with resultant hypotension and AKI. Hydralazine stopped.  -  Losartan decreased to 25 mg daily on 02/18 d/t bump in Scr.  - MAP stable.    7. CAD:   - s/p PCI RCA/PLOM with DES x2 and DES to mild LAD 1/18 - No chest pain.  - Continue statin. Off ASA due to GIB   8. Urinary retention - continue Flomax  - Foley replaced 03/01   9. Hypomagnesemia - Mag 1.9 yesterday - Continue PO Mag supp    I reviewed the LVAD parameters from today, and compared the results to the patient's prior recorded data.  No programming changes were made.  The LVAD is functioning within specified parameters.  The patient performs LVAD self-test daily.  LVAD interrogation was negative for any significant power changes, alarms or PI events/speed drops.  LVAD equipment check completed and is in good working order.  Back-up equipment present.   LVAD education done on emergency procedures and precautions and reviewed exit site care.  Length of Stay: [address] Ladoris Gene 07/01/2021, 11:17 AM  VAD Team --- VAD ISSUES ONLY--- Pager [phone] (7am - 7am)  Advanced Heart Failure Team  Pager [phone] (M-F; 7a - 5p)  Please contact [facility] for night-coverage after hours (5p -7a ) and weekends on [email or web address]   Patient seen and examined with the above-signed Advanced Practice Provider and/or Housestaff. I personally reviewed laboratory data, imaging studies and relevant notes. I independently examined the patient and formulated the  important aspects of the plan. I have edited the note to reflect any of my changes or salient points. I have personally discussed the plan with the patient and/or family.  Doing well with rehab, Still with mild HA. MAPs and volume status ok. INR 1.7  General:  NAD.  HEENT: normal  Neck: supple. JVP not elevated.  Carotids 2+ bilat; no bruits. No lymphadenopathy or thryomegaly appreciated. Cor: LVAD hum.  Lungs: Clear. Abdomen: soft, nontender, non-distended. No hepatosplenomegaly. No bruits or masses. Good bowel sounds. Driveline site clean. Anchor in place.  Extremities: no cyanosis, clubbing, rash. Warm no edema  Neuro: alert & oriented x 3. No focal deficits. Moves all 4 without problem   Stable from HF/VAD standpoint. INR 1.7 Discussed dosing with PharmD personally.  VAD interrogated personally. Parameters stable.  Glori Bickers, MD  1:00 PM

## 2021-07-01 NOTE — Progress Notes (Signed)
LVAD Coordinator Rounding Note: ? ?Admitted 06/05/21 due to [place] with hydrocephalus to Dr Bensimhon's service. Ventriculostomy placed at bedside by Dr Arnoldo Morale.  ? ?HM III LVAD implanted on 04/20/17 by Dr Cyndia Bent under destination therapy criteria. ? ?2/11 Presented with ICH ?2/11 Underwent bedside ventriculostomy wit NSU ?2/12 Repeat head CT with resolution of hydrocephalus. Persistent hematoma ?2/17 Repeat CT head with increased hemorrhage in right lateral ventricle, slightly increased ventricular dilation.  The ventriculostomy drain is occluded.  ?2/18 Ventriculostomy drain replaced ?2/25 CT improved ventriculomegaly. Drain pulled ?2/27 F/u CT w/ stable intraparenchymal hemorrhage. Unchanged from prior.    ?3/4 Head CT no change from prior ?3/6 d/c to CIR ? ? ?Pt sitting on the side of bed working with PT. Recognizes VAD Coordinator this am.  ? ? ?Vital signs: ?Temp: 97.9 ?HR: 90 ?Doppler Pressure: 80 ?Automatic BP: 97/71 (81) ?O2 Sat: 96% on RA ?Wt:177.4>176.3>175.9 lbs   ? ?LVAD interrogation reveals:  ?Speed: 5400 ?Flow: 4.1 ?Power:  4.0w ?PI: 5.2 ?Hematocrit: 38 ? ?Alarms: on batteries ?Events: on batteries ? ?Fixed speed: 5400 ?Low speed limit: 5100 ? ?Drive Line: CDI Weekly dressing changes per VAD coordinator or pt's wife. Next dressing change due: 07/05/21. ? ?Labs:  ?LDH trend: 716>967>893 ? ?INR trend: 1.5>1.7 ? ? ?Anticoagulation Plan: ?-INR Goal: 1.5-2   ?-ASA Dose: none due to hx of GI bleed ? ?Device: ?- Medtronic BiV ?-Therapies: on- VF 200, VT 167 ? AT 171 monitored ?- Last check: 04/29/21 ? ?Adverse Events on VAD: ?- Readmitted 2/19 hgb 3.8. INR 2.9.transfused 4u pRBCs. EGD - normal esophagus, 4 nonbleeding gastric AVMs treated with APC, normal duodenum and jejunum ?- Underwent DC-CV for AFL on 10/17/18  ?- Admitted 06/05/21 for [place] with ventriculostomy placement.  ? ?Plan/Recommendations:  ?1. Call VAD coordinator for any VAD equipment or drive line issues ?2. Weekly drive line dressing change per VAD  coordinator or pt's wife. Next dressing change due 07/05/21. ? ? ?Tanda Rockers RN ?VAD Coordinator  ?Office: [phone]  ?24/7 Pager: [phone]  ? ? ?  ?

## 2021-07-01 NOTE — IPOC Note (Signed)
Overall Plan of Care (IPOC) ?Patient Details ?Name: David [name] ?MRN: [id number] ?DOB: 1965-09-19 ? ?Admitting Diagnosis: ICH (intracerebral hemorrhage) ([place]) ? ?Hospital Problems: Principal Problem: ?  ICH (intracerebral hemorrhage) ([place]) ?Active Problems: ?  LVAD (left ventricular assist device) present ([place]) ?  SAH (subarachnoid hemorrhage) ([place]) ? ? ? ? Functional Problem List: ?Nursing Bladder, Bowel, Medication Management, Safety, Endurance, Pain  ?PT Balance, Behavior, Endurance, Motor, Pain, Perception, Safety  ?OT Balance, Behavior, Cognition, Endurance, Motor, Nutrition, Pain, Perception, Safety  ?SLP Cognition  ?TR    ?    ? Basic ADL?s: ?OT Grooming, Bathing, Dressing, Toileting  ? ?  Advanced  ADL?s: ?OT    ?   ?Transfers: ?PT Bed to Chair, Car, Furniture  ?OT Toilet, Tub/Shower  ? ?  Locomotion: ?PT Ambulation, Stairs  ? ?  Additional Impairments: ?OT Fuctional Use of Upper Extremity  ?SLP Social Cognition ?  ?Memory, Problem Solving, Awareness  ?TR    ? ? ?Anticipated Outcomes ?Item Anticipated Outcome  ?Self Feeding S  ?Swallowing ?   ?  ?Basic self-care ? S  ?Toileting ? S ?  ?Bathroom Transfers S  ?Bowel/Bladder ? manage bowel w mod I and bladder w mod I assist  ?Transfers ? supervision/ Mod I  ?Locomotion ? supervision/ Mod I  ?Communication ?    ?Cognition ? sup A  ?Pain ? pain at or below level 4 with prns  ?Safety/Judgment ? maintain safety w cues  ? ?Therapy Plan: ?PT Intensity: Minimum of 1-2 x/day ,45 to 90 minutes ?PT Frequency: 5 out of 7 days ?PT Duration Estimated Length of Stay: 10-14 days ?OT Intensity: Minimum of 1-2 x/day, 45 to 90 minutes ?OT Frequency: 5 out of 7 days ?OT Duration/Estimated Length of Stay: 7-10 ?SLP Intensity: Minumum of 1-2 x/day, 30 to 90 minutes ?SLP Frequency: 3 to 5 out of 7 days ?SLP Duration/Estimated Length of Stay: 7-10 days  ? ?Due to the current state of emergency, patients may not be receiving their 3-hours of Medicare-mandated therapy. ? ? Team  Interventions: ?Nursing Interventions Disease Management/Prevention, Bladder Management, Medication Management, Discharge Planning, Pain Management, Bowel Management, Patient/Family Education  ?PT interventions Discharge planning, Ambulation/gait training, Functional mobility training, Psychosocial support, Therapeutic Activities, Visual/perceptual remediation/compensation, Balance/vestibular training, Disease management/prevention, Neuromuscular re-education, Skin care/wound management, Therapeutic Exercise, Wheelchair propulsion/positioning, UE/LE Strength taining/ROM, Splinting/orthotics, Pain management, DME/adaptive equipment instruction, Cognitive remediation/compensation, Community reintegration, Patient/family education, Stair training, UE/LE Coordination activities  ?OT Interventions Balance/vestibular training, Discharge planning, Pain management, Self Care/advanced ADL retraining, Therapeutic Activities, UE/LE Coordination activities, Visual/perceptual remediation/compensation, Therapeutic Exercise, Skin care/wound managment, Patient/family education, Functional mobility training, Disease mangement/prevention, Cognitive remediation/compensation, Community reintegration, DME/adaptive equipment instruction, Neuromuscular re-education, Psychosocial support, Splinting/orthotics, UE/LE Strength taining/ROM, Wheelchair propulsion/positioning  ?SLP Interventions Cognitive remediation/compensation, Internal/external aids, Cueing hierarchy, Functional tasks, Patient/family education, Therapeutic Activities  ?TR Interventions    ?SW/CM Interventions Discharge Planning, Psychosocial Support, Patient/Family Education, Disease Management/Prevention  ? ?Barriers to Discharge ?MD  Medical stability and Need for LVAD management   ?Nursing New oxygen ?1 level 2 ste apt w wofe; has DME; rollator w seat, 3n! and signed up for [place] services  ?PT Decreased caregiver support, Lack of/limited family support, Insurance  for SNF coverage, Other (comments) (cognition/ memory) ?   ?OT   ?   ?SLP   ?   ?SW Insurance for SNF coverage ?   ? ?Team Discharge Planning: ?Destination: PT-Home ,OT- Home , SLP-Home ?Projected Follow-up: PT-Home health PT, Outpatient PT, 24 hour supervision/assistance, OT-  Home  health OT, SLP-Other (comment) (TBD) ?Projected Equipment Needs: PT-To be determined, OT- None recommended by OT, SLP-None recommended by SLP ?Equipment Details: PT- , OT-  ?Patient/family involved in discharge planning: PT- Patient,  OT-Patient, SLP-Patient ? ?MD ELOS: 7 to 10 days ?Medical Rehab Prognosis:  Good ?Assessment: The patient has been admitted for CIR therapies with the diagnosis of intracranial hemorrhage. The team will be addressing functional mobility, strength, stamina, balance, safety, adaptive techniques and equipment, self-care, bowel and bladder mgt, patient and caregiver education, chronic cardiac debility status post LVAD. Goals have been set at supervision. Anticipated discharge destination is home. ? ?Due to the current state of emergency, patients may not be receiving their 3 hours per day of Medicare-mandated therapy.   ? ? ?See Team Conference Notes for weekly updates to the plan of care  ?

## 2021-07-01 NOTE — Progress Notes (Signed)
Occupational Therapy Session Note ? ?Patient Details  ?Name: David [name] ?MRN: [id number] ?Date of Birth: 1965/09/02 ? ?Today's Date: 07/01/2021 ?OT Individual Time: 0900-1000 ?OT Individual Time Calculation (min): 60 min  ? ? ?Short Term Goals: ?Week 1:  OT Short Term Goal 1 (Week 1): STG=LTG d/t ELOS ? ?Skilled Therapeutic Interventions/Progress Updates:  ?   ?Pt received in bed with HA 5/10 which RN delivered medication. ?ADL: ?Pt completes ADL at overall CGA Level. Skilled interventions include: cuing for self organization for management of LVAD prior to mobilizing, increased time to swtich to battery- pt attempting to hook up wall power to battery, increased time for toilet void for bowel +bladder void per pt report as pt flushed before OT could visually verify, CGA-Sup standing at sink for bathing, and all clothing/ADL items placed in far L for visual scanning and L attention during session. With standing prolonged periods of time increased L knee flexion but no buckling, tactile cuing provided for posture.  ? ?Pt left at end of session in standing with PT  ? ?Therapy Documentation ?Precautions:  ?Precautions ?Precautions: Fall, Other (comment) ?Precaution Comments: LVAD (x4 year hx), L inattention ?Restrictions ?Weight Bearing Restrictions: No ?General: ?  ? ? ?Therapy/Group: Individual Therapy ? ?Lowella Dell Bhavya Grand ?07/01/2021, 6:47 AM ?

## 2021-07-01 NOTE — Progress Notes (Signed)
ANTICOAGULATION CONSULT NOTE ? ?Pharmacy Consult for warfarin ?Indication:  LVAD ? ?Allergies  ?Allergen Reactions  ? Plavix [Clopidogrel Bisulfate] Hives  ? ? ?Patient Measurements: ?Height: 5\' 5"  (165.1 cm) ?Weight: 79.8 kg (175 lb 14.8 oz) ?IBW/kg (Calculated) : 61.5 ? ?Vital Signs: ?Temp: 97.9 ?F (36.6 ?C) (03/09 0454) ?Temp Source: Oral (03/09 0454) ?BP: 97/71 (03/09 0454) ?Pulse Rate: 59 (03/09 0454) ? ?Labs: ?Recent Labs  ?  06/29/21 ?1194 06/30/21 ?1740 07/01/21 ?0559  ?LABPROT 18.0* 19.5* 20.3*  ?INR 1.5* 1.7* 1.7*  ?CREATININE  --  1.45* 1.44*  ? ? ? ?Estimated Creatinine Clearance: 55.1 mL/min (A) (by C-G formula based on SCr of 1.44 mg/dL (H)). ? ? ?Medical History: ?Past Medical History:  ?Diagnosis Date  ? AICD (automatic cardioverter/defibrillator) present   ? Anemia   ? Anxiety   ? CAD (coronary artery disease) 2009  ? AMI in 12/2007 with PCI to LAD, staged PCI to  mid/distal RCA, NSTEMI in 02/2009 with BMS to LCx  ? CHF (congestive heart failure) ([place])   ? Chronic kidney disease 11/03/2016  ? stage 3 kidney disease  ? Dysrhythmia   ? "arrythmia problems at some point", "fatal rhythms"  ? History of blood transfusion   ? "I was bleeding from was rectum" (04/19/2017)  ? HLD (hyperlipidemia)   ? HTN (hypertension)   ? Hyperlipidemia 03/15/2011  ? Ischemic cardiomyopathy   ? Admitted in 07/2010 with CHF exacerbation  ? Myocardial infarction [facility])   ? "I've had 4" (04/19/2017)  ? Pneumonia 2018 X 1  ? Seizures ([place])   ? one seizure in 04/2016 during cardiac event (04/19/2017)  ? STEMI 2009 (anterior), 2010 (lateral), and 2012 (inferior) 03/14/2011  ? ? ?Assessment: ?49 yoM on warfarin PTA for hx HM3 LVAD. Pt admitted 2/11 with ICH requiring ventriculostomy. INR 2.4 on admit reversed with vitamin K and 4FPCC. Ventriculostomy drain removed 2/25. ? ?Warfarin ordered to resume 2/27 but not given until 3/1, head CT stable. INR therapeutic at 1.7 today. ? ?*Home warfarin dose 4mg  Fri, 2mg  AODs ? ?Goal of  Therapy:  ?INR 1.5-2.0 (given ICH on warfarin within therapeutic range) ?Monitor platelets by anticoagulation protocol: Yes ?  ?Plan:  ?-Warfarin 1.5mg  PO x1 ?-Daily INR ? ?Cathrine Muster, PharmD ?PGY2 Cardiology Pharmacy Resident ?Phone: [phone] ?07/01/2021  12:36 PM ? ?Please check [email or web address] for unit-specific pharmacy phone numbers. ? ? ? ? ?

## 2021-07-01 NOTE — Progress Notes (Signed)
Physical Therapy Session Note ? ?Patient Details  ?Name: David [name] ?MRN: [id number] ?Date of Birth: 08-Sep-1965 ? ?Today's Date: 07/01/2021 ?PT Individual Time: 1791-5056 ?PT Individual Time Calculation (min): 45 min  ? ?Short Term Goals: ?Week 1:  PT Short Term Goal 1 (Week 1): Pt will demonstrate standing functional transfers at supervision. ?PT Short Term Goal 2 (Week 1): Pt will ambulate with improved L foot clearance and improved attention to L with close supervision. ?PT Short Term Goal 3 (Week 1): Pt will ascend/ descend at least 4 steps using no HR with supervision. ?PT Short Term Goal 4 (Week 1): Pt will complete Functional Gait Assessment. ?PT Short Term Goal 5 (Week 1): Pt will complete Berg Balance Test. ? ?Skilled Therapeutic Interventions/Progress Updates:  ?   ?Pt received supine in bed and agrees to therapy. Reports 5/10 headache. PT provides rest breaks as needed to manage pain. Pt reports need to urinate. Bed mobility independent. Pt requires modA to transfer LVAD over to battery packs and don harness. Sit to stand and ambulatory transfer to toilet with cues for positioning. Pt stands at sink to wash hands independently. Pt ambulates x150' to gym with cues to increase stride length and trunk rotation to decrease risk for falls. Pt performs NMR for L inattention and dynamic balance, tasked with ambulating backward in hallway and locating post-it notes placed on L and R hallway. Pt is able to find 100% of notes with minimal cueing and no assistance required for balance. Pt reaches outside BOS and over head level to retrieve targets. Pt takes seated rest break prior to ambulating to dayroom. Pt stand on airex mat at high low table to provide unreliable somatosensory input, while tasked with pattern recreation with peg board. Pt is able to sequence colors correctly but has significant difficulty with spacing of pegs, unable to perform correctly without max to total cueing. Pt reports increasing  headache and requests to return to room. Pt ambulates x300' back to room. Left seated at EOB and RN present providing pain meds. Pt misses 15 minutes of skilled PT due to headache. ? ?Therapy Documentation ?Precautions:  ?Precautions ?Precautions: Fall, Other (comment) ?Precaution Comments: LVAD (x4 year hx), L inattention ?Restrictions ?Weight Bearing Restrictions: No ? ? ?Therapy/Group: Individual Therapy ? ?Breck Coons, PT, DPT ?07/01/2021, 3:38 PM  ?

## 2021-07-01 NOTE — Patient Care Conference (Signed)
Inpatient RehabilitationTeam Conference and Plan of Care Update ?Date: 06/30/2021   Time: 10:10 AM  ? ? ?Patient Name: David [name]      ?Medical Record Number: [id number]  ?Date of Birth: [birth year] ?Sex: Male         ?Room/Bed: 4M07C/4M07C-01 ?Payor Info: Payor: [employer] MEDICARE / Plan: [place] HMO / Product Type: *No Product type* /   ? ?Admit Date/Time:  06/28/2021  5:33 PM ? ?Primary Diagnosis:  ICH (intracerebral hemorrhage) ([place]) ? ?Hospital Problems: Principal Problem: ?  ICH (intracerebral hemorrhage) ([place]) ?Active Problems: ?  LVAD (left ventricular assist device) present ([place]) ? ? ? ?Expected Discharge Date: Expected Discharge Date: 07/10/21 ? ?Team Members Present: ?Physician leading conference: Dr. Alysia Penna ?Social Worker Present: Erlene Quan, BSW ?Nurse Present: Dorien Chihuahua, RN ?PT Present: Barrie Folk, PT ?OT Present: Lillia Corporal, OT ?SLP Present: Sherren Kerns, SLP ?PPS Coordinator present : Gunnar Fusi, SLP ? ?   Current Status/Progress Goal Weekly Team Focus  ?Bowel/Bladder ? ?   Foley out; voiding on his own. Continent of bowel   Continent   Toileting, monitor need for laxative  ?Swallow/Nutrition/ Hydration ? ?           ?ADL's ? ? Min LB dress, bathe, and toilet, MIN stand pivot transfers, L innattention  Supervision overall  ADL retraining, AE PRN, standing balance and tolerance, ADL transfers   ?Mobility ? ? L inattention and L lean/ decreased foot clearance with fatigue, unaware of physical and cognitive deficits; Bed mobility = supervision, Transfers = CGA/ MinA for balance, Ambulation = close supervision/ CGA, stairs = CGA  overall supervision/ modI  balance!, L attention and NMR  (L lean increases with fatigue and pt crowding L side of hallway), ambulation quality/ awareness, transfers, safety awareness, family education   ?Communication ? ?           ?Safety/Cognition/ Behavioral Observations ? min-to-mod A  sup A  problem solving, memory, awareness,  executive functions   ?Pain ? ?   Headaches   Pain at or below level 4 with prns   Tylenol, tramadol and oxy prn headaches; MD added topamax  ?Skin ? ?   N/A        ? ? ?Discharge Planning:  ?Discharging home with spouse able to provide 24/7 supervision   ?Team Discussion: ?Patient with mild left inattention and cognitive deficits appear worse than physical deficits post ICH. Patient\'s progress limited by high level balance issues and moderate cognitive impairments. Problems with problem solving, error awareness and executive functions with minimal awareness of deficits post event cause functional struggling.  ? ?Patient on target to meet rehab goals: ?yes, currently need min assist for lower body bathing and dressing. Requires intermittent supervision for lateral transfers. Goals for discharge set for supervision overall. ? ?*See Care Plan and progress notes for long and short-term goals.  ? ?Revisions to Treatment Plan:  ?N/A ?  ?Teaching Needs: ?Safety, medication management, transfers, toileting, etc  ?Current Barriers to Discharge: ?Decreased caregiver support ? ?Possible Resolutions to Barriers: ?Family education ?  ? ? Medical Summary ?Current Status: post bleed HA,left neglect ? Barriers to Discharge: Medical stability;Other (comments);Wound care ? Barriers to Discharge Comments: has LVAD ?Possible Resolutions to [employer] Focus: heart failure team following for LVAD, manage meds for HA ? ? ?Continued Need for Acute Rehabilitation Level of Care: The patient requires daily medical management by a physician with specialized training in physical medicine and rehabilitation for the following reasons: ?Direction of  a multidisciplinary physical rehabilitation program to maximize functional independence : Yes ?Medical management of patient stability for increased activity during participation in an intensive rehabilitation regime.: Yes ?Analysis of laboratory values and/or radiology reports with any  subsequent need for medication adjustment and/or medical intervention. : Yes ? ? ?I attest that I was present, lead the team conference, and concur with the assessment and plan of the team. ? ? ?[name] ?07/01/2021, 8:30 AM  ? ? ? ? ? ? ?

## 2021-07-01 NOTE — Progress Notes (Signed)
Physical Therapy Session Note ? ?Patient Details  ?Name: David [name] ?MRN: [id number] ?Date of Birth: 07-02-1965 ? ?Today's Date: 07/01/2021 ?PT Individual Time: 1000-1102 ?PT Individual Time Calculation (min): 62 min  ? ?Short Term Goals: ?Week 1:  PT Short Term Goal 1 (Week 1): Pt will demonstrate standing functional transfers at supervision. ?PT Short Term Goal 2 (Week 1): Pt will ambulate with improved L foot clearance and improved attention to L with close supervision. ?PT Short Term Goal 3 (Week 1): Pt will ascend/ descend at least 4 steps using no HR with supervision. ?PT Short Term Goal 4 (Week 1): Pt will complete Functional Gait Assessment. ?PT Short Term Goal 5 (Week 1): Pt will complete Berg Balance Test. ? ?Skilled Therapeutic Interventions/Progress Updates:  ?  Handoff from OT in room already connected to battery power already for LVAD. Session focused on overall functional mobility retraining, standardized balance assessment for fall risk assessment, d/c planning, and NMR for higher level balance retraining and addressing L inattention.  ? ?Pt performed functional mobility without AD throughout session including transfers from various surfaces, reaching down to collect his LVAD bag from floor or low cart, and gait on unit with overall supervision.  ? ?Pt noted to demonstrate significant L inattention and decreased body awareness in space while moving through hallways, crowded environments, etc despite verbal cues.  ? ?NMR retraining to address functional dynamic balance in standing while completing cognitive task with forced focus to attend to the L. Pt on Kinetron with both stable initially and then dynamic surface to increase challenge while performing letter and number organization task with items located on the L and mixed up to promote visual scanning. Pt took extra time to complete task. Had increase challenge on second trial to sort numbers and letters in reverse order. Pt completed numbers  without cues reverse but unable to recall instructions to complete letters in reverse. Mixed up 2 letters out 8 letters but able to self correct when asked at the end to check his work.  ? ?Returned to room at end of session and pt reconnected LVAD from battery power back to wall power. Pt requires extra time for processing and completing of tasks. Started and stopped taking shoes off several times to prepare to get back in bed.  ? ?Pt requested to stay EOB to wait until wife arrived. Cleared with RN and bed alarm on.  ? ? ?Therapy Documentation ?Precautions:  ?Precautions ?Precautions: Fall, Other (comment) ?Precaution Comments: LVAD (x4 year hx), L inattention ?Restrictions ?Weight Bearing Restrictions: No ? ?Pain: ?Reports 5.5/10 headache - RN aware. Pt reports nothing helps ?   ?Balance: ?Standardized Balance Assessment ?Standardized Balance Assessment: Functional Gait Assessment ?Functional Gait  Assessment ?Gait assessed : Yes ?Gait Level Surface: Walks 20 ft in less than 7 sec but greater than 5.5 sec, uses assistive device, slower speed, mild gait deviations, or deviates 6-10 in outside of the 12 in walkway width. ?Change in Gait Speed: Makes only minor adjustments to walking speed, or accomplishes a change in speed with significant gait deviations, deviates 10-15 in outside the 12 in walkway width, or changes speed but loses balance but is able to recover and continue walking. ?Gait with Horizontal Head Turns: Performs head turns smoothly with slight change in gait velocity (eg, minor disruption to smooth gait path), deviates 6-10 in outside 12 in walkway width, or uses an assistive device. ?Gait with Vertical Head Turns: Performs task with slight change in gait velocity (eg, minor disruption  to smooth gait path), deviates 6 - 10 in outside 12 in walkway width or uses assistive device ?Gait and Pivot Turn: Pivot turns safely in greater than 3 sec and stops with no loss of balance, or pivot turns safely  within 3 sec and stops with mild imbalance, requires small steps to catch balance. ?Step Over Obstacle: Is able to step over one shoe box (4.5 in total height) without changing gait speed. No evidence of imbalance. ?Gait with Narrow Base of Support: Is able to ambulate for 10 steps heel to toe with no staggering. ?Gait with Eyes Closed: Walks 20 ft, slow speed, abnormal gait pattern, evidence for imbalance, deviates 10-15 in outside 12 in walkway width. Requires more than 9 sec to ambulate 20 ft. ?Ambulating Backwards: Walks 20 ft, slow speed, abnormal gait pattern, evidence for imbalance, deviates 10-15 in outside 12 in walkway width. ?Steps: Alternating feet, no rail. ?Total Score: 19 ? ?Patient demonstrates increased fall risk as noted by score of 19/30 on  Functional Gait Assessment.   ?<22/30 = predictive of falls, <20/30 = fall in 6 months, <18/30 = predictive of falls in PD ?MCID: 5 points stroke population, 4 points geriatric population ?(ANPTA Core Set of Outcome Measures for Adults with Neurologic Conditions, 2018) ? ?Therapy/Group: Individual Therapy ? ?Allayne Gitelman ?Lars Masson, PT, DPT, CBIS ? ?07/01/2021, 11:13 AM  ?

## 2021-07-02 LAB — BASIC METABOLIC PANEL
Anion gap: 7 (ref 5–15)
BUN: 9 mg/dL (ref 6–20)
CO2: 23 mmol/L (ref 22–32)
Calcium: 8.9 mg/dL (ref 8.9–10.3)
Chloride: 107 mmol/L (ref 98–111)
Creatinine, Ser: 1.63 mg/dL — ABNORMAL HIGH (ref 0.61–1.24)
GFR, Estimated: 49 mL/min — ABNORMAL LOW (ref >60)
Glucose, Bld: 87 mg/dL (ref 70–99)
Potassium: 4.6 mmol/L (ref 3.5–5.1)
Sodium: 137 mmol/L (ref 135–145)

## 2021-07-02 LAB — LACTATE DEHYDROGENASE: LDH: 172 U/L (ref 98–192)

## 2021-07-02 LAB — PROTIME-INR
INR: 1.8 — ABNORMAL HIGH (ref 0.8–1.2)
Prothrombin Time: 20.6 seconds — ABNORMAL HIGH (ref 11.4–15.2)

## 2021-07-02 MED ORDER — WARFARIN 0.5 MG HALF TABLET
1.5000 mg | ORAL_TABLET | Freq: Once | ORAL | Status: AC
  Administered 2021-07-02: 1.5 mg via ORAL
  Filled 2021-07-02: qty 1

## 2021-07-02 NOTE — Progress Notes (Signed)
Physical Therapy Session Note ? ?Patient Details  ?Name: David [name] ?MRN: [id number] ?Date of Birth: 05/02/1965 ? ?Today's Date: 07/02/2021 ?PT Individual Time: 5427-0623 ?PT Individual Time Calculation (min): 58 min  ? ?Short Term Goals: ?Week 1:  PT Short Term Goal 1 (Week 1): Pt will demonstrate standing functional transfers at supervision. ?PT Short Term Goal 2 (Week 1): Pt will ambulate with improved L foot clearance and improved attention to L with close supervision. ?PT Short Term Goal 3 (Week 1): Pt will ascend/ descend at least 4 steps using no HR with supervision. ?PT Short Term Goal 4 (Week 1): Pt will complete Functional Gait Assessment. ?PT Short Term Goal 5 (Week 1): Pt will complete Berg Balance Test. ? ? ?Skilled Therapeutic Interventions/Progress Updates:  ? ?Pt received supine in bed and agreeable to PT. Supine>sit transfer without assist  or cues. Per pt request, PT managing LVAD clips and batteries. Pt donning shoes with set up from PT.  ? ?Gait training through hall 2 x 560f. To and from day room. Distant supervision assist from PT for safety with min cues for direction and visual scanning to the L.  ? ?Dynamic balance/cognitive task to retrieve and toss horse shoes in 5 field sequence. Mod-max cues for retention of sequecne. Pt able to pick bean bags up from floor with supervision assist.  Wii bowling and wii tennis set of 3games x 2rounds from airex pad. Supervision assist from PT and  cues for attention to task intermittently throughout.  ?  ?Pt reporting increasing HA and need to Lie down. Pt returned to room and performed ambulatory transfer to bed with no Ad and supervision assist. Sit>supine completed without and left supine in bed with call bell in reach and all needs met. PT reconnected LVAD to wall power source with RN present in room  ? ?   ? ?Therapy Documentation ?Precautions:  ?Precautions ?Precautions: Fall, Other (comment) ?Precaution Comments: LVAD (x4 year hx), L  inattention ?Restrictions ?Weight Bearing Restrictions: No ?General: ?PT Amount of Missed Time (min): 17 Minutes ?PT Missed Treatment Reason: Pain ?Vital Signs: ?Therapy Vitals ?Temp: 97.8 ?F (36.6 ?C) ?Temp Source: Oral ?Pulse Rate: 76 ?Resp: 14 ?BP: (!) 113/98 ?Patient Position (if appropriate): Lying ?Oxygen Therapy ?SpO2: 98 % ?O2 Device: Room Air ?Pain: ?Pain Assessment ?Pain Scale: 0-10 ?Pain Score: 6  ?Pain Type: Acute pain ?Pain Location: Head ?Pain Descriptors / Indicators: Aching ?Pain Onset: On-going ?Pain Intervention(s): Medication (See eMAR) ? ? ? ?Therapy/Group: Individual Therapy ? ?ALorie Phenix?07/02/2021, 11:22 AM  ?

## 2021-07-02 NOTE — Progress Notes (Signed)
Speech Language Pathology Daily Session Note ? ?Patient Details  ?Name: David [name] ?MRN: [id number] ?Date of Birth: 06/29/1965 ? ?Today's Date: 07/02/2021 ?SLP Individual Time: 1400-1500 ?SLP Individual Time Calculation (min): 60 min ? ?Short Term Goals: ?Week 1: SLP Short Term Goal 1 (Week 1): STG=LTG due to ELOS ? ?Skilled Therapeutic Interventions: Skilled ST treatment focused on cognitive goals. SLP facilitated session by providing max A for completing mild-moderately complex error awareness task by identifying medication organization errors in TID pillbox with 3 prescription medications. With mod A verbal/visual cues, patient identified 1/7 organization errors successfully. Patient identified 3/7 errors with max A verbal and visual cues, verbal and written repetition, significantly extended processing time (2-3 minutes), and min-to-mod A verbal redirection for attention to task. Pt exhibited decreased mental flexibility as evidenced by stating that it's essentially pointless for him to double check his pillbox to ensure accuracy because "I don't make mistakes." Comments and performance are suggestive of decreased insight into deficits. Pt was agreeable however to having spouse assist him with medication management at discharge. Patient was left in bed with alarm activated and immediate needs within reach at end of session. Continue per current plan of care.   ?   ?Pain ?Pain Assessment ?Pain Scale: 0-10 ?Pain Score: 3  ?Pain Type: Acute pain ?Pain Location: Head ?Pain Orientation: Left;Right ?Pain Descriptors / Indicators: Aching ?Pain Onset: On-going ?Pain Intervention(s): Medication (See eMAR) ? ?Therapy/Group: Individual Therapy ? ?Anniyah Mood T Jamarco Zaldivar ?07/02/2021, 2:15 PM ?

## 2021-07-02 NOTE — Progress Notes (Addendum)
Advanced Heart Failure VAD Team Note  PCP-Cardiologist: Glori Bickers, MD   Subjective:    2/11 Presented with [place] 2/11 Underwent bedside ventriculostomy wit NSU 2/12 Repeat head CT with resolution of hydrocephalus. Persistent hematoma 2/17 Repeat CT head with increased hemorrhage in right lateral ventricle, slightly increased ventricular dilation.  The ventriculostomy drain is occluded.  2/18 Ventriculostomy drain replaced 2/25 CT improved ventriculomegaly. Drain pulled 2/27 F/u CT w/ stable intraparenchymal hemorrhage. Unchanged from prior.    3/1 Coumadin restarted  3/4 Confusion. Head CT no change from prior 3/6 Discharged to CIR    INR 1.8 today.  Creatinine 1.4>1.6  LDH stable, 166  Complaining of headache 5/10.    LVAD INTERROGATION:  HeartMate III LVAD:   Flow 4.8 liters/min, speed 5400, power 4.2  PI 4.7. On batteries.   Objective:    Vital Signs:   Temp:  [97.8 F (36.6 C)-98.6 F (37 C)] 97.8 F (36.6 C) (03/10 0759) Pulse Rate:  [69-77] 76 (03/10 0759) Resp:  [14-20] 14 (03/10 0759) BP: (90-113)/(74-98) 113/98 (03/10 0759) SpO2:  [95 %-98 %] 98 % (03/10 0759) Weight:  [77.4 kg] 77.4 kg (03/10 0518) Last BM Date : 07/01/21 Mean arterial Pressure 80s   Intake/Output:   Intake/Output Summary (Last 24 hours) at 07/02/2021 1210 Last data filed at 07/01/2021 2200 Gross per 24 hour  Intake 240 ml  Output 275 ml  Net -35 ml     Physical Exam   Physical Exam: GENERAL: No acute distress. In bed  HEENT: normal  NECK: Supple, JVP flat   .  2+ bilaterally, no bruits.  No lymphadenopathy or thyromegaly appreciated.   CARDIAC:  Mechanical heart sounds with LVAD hum present.  LUNGS:  Clear to auscultation bilaterally.  ABDOMEN:  Soft, round, nontender, positive bowel sounds x4.     LVAD exit site: well-healed and incorporated.  Dressing dry and intact.  No erythema or drainage.  Stabilization device present and accurately applied.  Driveline dressing is  being changed daily per sterile technique. EXTREMITIES:  Warm and dry, no cyanosis, clubbing, rash or edema  NEUROLOGIC:  Alert and oriented x 3.    No aphasia.  No dysarthria.  Affect pleasant.     Labs   Basic Metabolic Panel: Recent Labs  Lab 06/26/21 0044 06/27/21 0047 06/28/21 0046 06/29/21 0537 06/30/21 0623 07/01/21 0559 07/02/21 0712  NA 134* 135 134*  --  137 136 137  K 4.1 3.8 3.7  --  4.4 3.8 4.6  CL 104 102 103  --  106 104 107  CO2 24 26 24   --  23 24 23   GLUCOSE 87 79 91  --  83 82 87  BUN 13 11 11   --  7 9 9   CREATININE 1.38* 1.24 1.32*  --  1.45* 1.44* 1.63*  CALCIUM 8.5* 8.7* 8.4*  --  8.6* 8.6* 8.9  MG 1.7  --  1.8 1.6* 1.9  --   --     Liver Function Tests: No results for input(s): AST, ALT, ALKPHOS, BILITOT, PROT, ALBUMIN in the last 168 hours. No results for input(s): LIPASE, AMYLASE in the last 168 hours. No results for input(s): AMMONIA in the last 168 hours.  CBC: Recent Labs  Lab 06/26/21 0044 06/27/21 0047 06/28/21 0046  WBC 8.0 7.3 6.8  HGB 11.9* 12.1* 12.3*  HCT 36.8* 37.9* 38.0*  MCV 83.3 84.2 83.5  PLT 185 193 183    INR: Recent Labs  Lab 06/28/21 0046 06/29/21 0537 06/30/21  2536 07/01/21 0559 07/02/21 0712  INR 1.2 1.5* 1.7* 1.7* 1.8*    Other results: EKG:    Imaging   No results found.   Medications:     Scheduled Medications:  amiodarone  200 mg Oral Daily   atorvastatin  40 mg Oral Daily   Chlorhexidine Gluconate Cloth  6 each Topical Daily   feeding supplement  1 Container Oral TID BM   losartan  25 mg Oral Daily   magnesium oxide  400 mg Oral BID   mouth rinse  15 mL Mouth Rinse BID   multivitamin with minerals  1 tablet Oral Daily   pantoprazole  40 mg Oral Daily   polyethylene glycol  17 g Oral Daily   senna-docusate  2 tablet Oral QPC supper   tamsulosin  0.4 mg Oral Daily   topiramate  50 mg Oral BID   traZODone  50 mg Oral QHS   warfarin  1.5 mg Oral ONCE-1600   Warfarin - Pharmacist Dosing  Inpatient   Does not apply q1600    Infusions:   PRN Medications: acetaminophen, alum & mag hydroxide-simeth, bisacodyl, diphenhydrAMINE, guaiFENesin-dextromethorphan, hydrALAZINE, ipratropium-albuterol, lidocaine, oxyCODONE, polyethylene glycol, polyvinyl alcohol, prochlorperazine **OR** prochlorperazine **OR** prochlorperazine, sodium phosphate, sorbitol, traMADol, traZODone    Assessment/Plan:    1. Intracerebral hemorrhage - CT brain with intracerebral and intraventricular hemorrhage with obstructive hydrocephalus. INR 2.4 - AC reversed with Kcentra/vit k in ER - Underwent bedside ventriculostomy on 2/11 - Initial ventriculostomy drain occluded. CT head 2/17 with increased hemorrhage in right lateral ventricle, slightly increased ventricular dilation.  On 2/18, ventriculostomy drain replaced.  - CT 2/25 improved ventriculomegaly. Drain pulled 2/25 - CT 2/27 stable intraparenchymal hemorrhage in the frontal lobe and basal ganglia on the right. Midline shift is slightly reduced from the prior exam. - Increased confusion on 03/04. CT with stable intraparenchymal hemorrhage.  - Continues with headache. - On coumadin.     2. Chronic systolic CHF with HM III LVAD implant 04/20/17:  - Has done well with VAD support. - He has been seen at [facility] for transplant referral but has decided not to pursue transplant. No change - New oxygen requirement with pulmonary edema on 2/17 CXR => good diuresis on 2/18.  -  2/27 and 2/28 Coumadin ordered not but given. Started 03/01. INR 1.8  today. Dosing per Pharmacy. -  Volume status stable. Does not need diuretics.  - Continue current dose of losartan.  - LDH stable.    3. Paroxysmal AFL with RVR - had DC-CV on 10/17/18 with subsequent short episodes of AF after but has been mostly in NSR on amio - Had recurrent AF in 10/22.  - not on the monitor. Rate controlled.  - Continue amio 200 mg daily - Continue coumadin.    4. h/o UGI AVM bleed - s/p  APC x 4 lesions in 2/19 - No recurrent bleeding  - Continue PPI - Off ASA due to GIB    5. AKI on CKD Stage IIIa:  - Creatinine baseline 1.3 - 1.7 - SCr 1.4>1.6 today.     6. VAD management - VAD interrogated personally. Parameters stable. - LDH has been stable.  - INR goal 1.5-2.0 Off ASA due to GIB 2/19.  - INR 2.4 reversed due to [place]. INR 1.8 today  - Continue coumadin, no heparin - Maps/LDH stable. Continue losartan.  - DL site looks good   6. HTN - goal MAP 70-90 with ICH  - losartan increased 2/14 with  resultant hypotension and AKI. Hydralazine stopped.  - Losartan decreased to 25 mg daily on 02/18 d/t bump in Scr.  - MAP stable.    7. CAD:   - s/p PCI RCA/PLOM with DES x2 and DES to mild LAD 1/18 - No chest pain.  - Continue statin. Off ASA due to GIB   8. Urinary retention - continue Flomax  - Foley replaced 03/01   9. Hypomagnesemia - Continue PO Mag supp   Rehab staff appreciated.   I reviewed the LVAD parameters from today, and compared the results to the patient's prior recorded data.  No programming changes were made.  The LVAD is functioning within specified parameters.  The patient performs LVAD self-test daily.  LVAD interrogation was negative for any significant power changes, alarms or PI events/speed drops.  LVAD equipment check completed and is in good working order.  Back-up equipment present.   LVAD education done on emergency procedures and precautions and reviewed exit site care.  Length of Stay: 4  Darrick Grinder, NP 07/02/2021, 12:10 PM  VAD Team --- VAD ISSUES ONLY--- Pager [phone] (7am - 7am)  Advanced Heart Failure Team  Pager [phone] (M-F; 7a - 5p)  Please contact [facility] for night-coverage after hours (5p -7a ) and weekends on [email or web address]  Patient seen and examined with the above-signed Advanced Practice Provider and/or Housestaff. I personally reviewed laboratory data, imaging studies and relevant notes. I independently examined  the patient and formulated the important aspects of the plan. I have edited the note to reflect any of my changes or salient points. I have personally discussed the plan with the patient and/or family.  Feeling stronger. Still with mild HA. No other neuro sx.   General:  NAD.  HEENT: normal  Neck: supple. JVP not elevated.  Carotids 2+ bilat; no bruits. No lymphadenopathy or thryomegaly appreciated. Cor: LVAD hum.  Lungs: Clear. Abdomen:  soft, nontender, non-distended. No hepatosplenomegaly. No bruits or masses. Good bowel sounds. Driveline site clean. Anchor in place.  Extremities: no cyanosis, clubbing, rash. Warm no edema  Neuro: alert & oriented x 3. No focal deficits. Moves all 4 without problem   Stable from Neuro and HF perspectives. MAPS ok. Volume status ok.   INR 1.8   VAD interrogated personally. Parameters stable.  Continue current management. Appreciate Rehab's care.  Glori Bickers, MD  7:09 PM

## 2021-07-02 NOTE — Progress Notes (Signed)
ANTICOAGULATION CONSULT NOTE ? ?Pharmacy Consult for warfarin ?Indication:  LVAD ? ?Allergies  ?Allergen Reactions  ? Plavix [Clopidogrel Bisulfate] Hives  ? ? ?Patient Measurements: ?Height: 5\' 5"  (165.1 cm) ?Weight: 77.4 kg (170 lb 10.2 oz) ?IBW/kg (Calculated) : 61.5 ? ?Vital Signs: ?Temp: 97.8 ?F (36.6 ?C) (03/10 0759) ?Temp Source: Oral (03/10 0759) ?BP: 113/98 (03/10 0759) ?Pulse Rate: 76 (03/10 0759) ? ?Labs: ?Recent Labs  ?  06/30/21 ?0623 07/01/21 ?0559 07/02/21 ?8757  ?LABPROT 19.5* 20.3* 20.6*  ?INR 1.7* 1.7* 1.8*  ?CREATININE 1.45* 1.44* 1.63*  ? ? ? ?Estimated Creatinine Clearance: 48 mL/min (A) (by C-G formula based on SCr of 1.63 mg/dL (H)). ? ? ?Medical History: ?Past Medical History:  ?Diagnosis Date  ? AICD (automatic cardioverter/defibrillator) present   ? Anemia   ? Anxiety   ? CAD (coronary artery disease) 2009  ? AMI in 12/2007 with PCI to LAD, staged PCI to  mid/distal RCA, NSTEMI in 02/2009 with BMS to LCx  ? CHF (congestive heart failure) ([place])   ? Chronic kidney disease 11/03/2016  ? stage 3 kidney disease  ? Dysrhythmia   ? "arrythmia problems at some point", "fatal rhythms"  ? History of blood transfusion   ? "I was bleeding from was rectum" (04/19/2017)  ? HLD (hyperlipidemia)   ? HTN (hypertension)   ? Hyperlipidemia 03/15/2011  ? Ischemic cardiomyopathy   ? Admitted in 07/2010 with CHF exacerbation  ? Myocardial infarction [facility])   ? "I've had 4" (04/19/2017)  ? Pneumonia 2018 X 1  ? Seizures ([place])   ? one seizure in 04/2016 during cardiac event (04/19/2017)  ? STEMI 2009 (anterior), 2010 (lateral), and 2012 (inferior) 03/14/2011  ? ? ?Assessment: ?38 yoM on warfarin PTA for hx HM3 LVAD. Pt admitted 2/11 with ICH requiring ventriculostomy. INR 2.4 on admit reversed with vitamin K and 4FPCC. Ventriculostomy drain removed 2/25. ? ?Warfarin ordered to resume 2/27 but not given until 3/1, head CT stable. INR therapeutic at 1.8 today. ? ?*Home warfarin dose 4mg  Fri, 2mg  AODs ? ?Goal of  Therapy:  ?INR 1.5-2.0 (given ICH on warfarin within therapeutic range) ?Monitor platelets by anticoagulation protocol: Yes ?  ?Plan:  ?-Warfarin 1.5mg  PO x1 ?-Daily INR ?-CBC every 72h ordered ? ?Cathrine Muster, PharmD ?PGY2 Cardiology Pharmacy Resident ?Phone: [phone] ?07/02/2021  8:31 AM ? ?Please check [email or web address] for unit-specific pharmacy phone numbers. ? ? ? ? ?

## 2021-07-02 NOTE — Progress Notes (Signed)
LVAD Coordinator Rounding Note: ? ?Admitted 06/05/21 due to [place] with hydrocephalus to Dr Bensimhon's service. Ventriculostomy placed at bedside by Dr Arnoldo Morale.  ? ?HM III LVAD implanted on 04/20/17 by Dr Cyndia Bent under destination therapy criteria. ? ?2/11 Presented with ICH ?2/11 Underwent bedside ventriculostomy wit NSU ?2/12 Repeat head CT with resolution of hydrocephalus. Persistent hematoma ?2/17 Repeat CT head with increased hemorrhage in right lateral ventricle, slightly increased ventricular dilation.  The ventriculostomy drain is occluded.  ?2/18 Ventriculostomy drain replaced ?2/25 CT improved ventriculomegaly. Drain pulled ?2/27 F/u CT w/ stable intraparenchymal hemorrhage. Unchanged from prior.    ?3/4 Head CT no change from prior ?3/6 d/c to CIR ? ? ?I received page this morning from bedside nurse that the pts controller was unable to perform a self test. When I arrived pt was bathing with PT. Controller passed self test with no issues. Pt states he was pushing the wrong button. Pt will need some reinforcement and cues for self test. Pt was pressing the display button instead of the battery button. ? ?Vital signs: ?Temp: 97.8 ?HR: 76 ?Doppler Pressure: 84 ?Automatic BP: 113/98 (105) ?O2 Sat: 98% on RA ?Wt:177.4>176.3>175.9>170.6 lbs   ? ?LVAD interrogation reveals:  ?Speed: 5400 ?Flow: 4.8 ?Power:  4.2w ?PI: 4.7 ?Hematocrit: 38 ? ?Alarms: none ?Events: none ? ?Fixed speed: 5400 ?Low speed limit: 5100 ? ?Drive Line: CDI Weekly dressing changes per VAD coordinator or pt's wife. Next dressing change due: 07/05/21. ? ?Labs:  ?LDH trend: 172>194>166>172 ? ?INR trend: 1.5>1.7>1.8 ? ? ?Anticoagulation Plan: ?-INR Goal: 1.5-2   ?-ASA Dose: none due to hx of GI bleed ? ?Device: ?- Medtronic BiV ?-Therapies: on- VF 200, VT 167 ? AT 171 monitored ?- Last check: 04/29/21 ? ?Adverse Events on VAD: ?- Readmitted 2/19 hgb 3.8. INR 2.9.transfused 4u pRBCs. EGD - normal esophagus, 4 nonbleeding gastric AVMs treated with APC,  normal duodenum and jejunum ?- Underwent DC-CV for AFL on 10/17/18  ?- Admitted 06/05/21 for [place] with ventriculostomy placement.  ? ?Plan/Recommendations:  ?1. Call VAD coordinator for any VAD equipment or drive line issues ?2. Weekly drive line dressing change per VAD coordinator or pt's wife. Next dressing change due 07/05/21. ? ? ?Tanda Rockers RN ?VAD Coordinator  ?Office: [phone]  ?24/7 Pager: [phone]  ? ? ?  ?

## 2021-07-02 NOTE — Progress Notes (Signed)
?                                                       PROGRESS NOTE ? ? ?Subjective/Complaints: ? ?HA improved , discussed d/c date  ? ?ROS- neg CP, SOB, N/V/D ? ?Objective: ?  ?No results found. ?No results for input(s): WBC, HGB, HCT, PLT in the last 72 hours. ? ?Recent Labs  ?  06/30/21 ?0623 07/01/21 ?0559  ?NA 137 136  ?K 4.4 3.8  ?CL 106 104  ?CO2 23 24  ?GLUCOSE 83 82  ?BUN 7 9  ?CREATININE 1.45* 1.44*  ?CALCIUM 8.6* 8.6*  ? ? ? ?Intake/Output Summary (Last 24 hours) at 07/02/2021 0752 ?Last data filed at 07/01/2021 2200 ?Gross per 24 hour  ?Intake 240 ml  ?Output 275 ml  ?Net -35 ml  ? ?  ? ?  ? ?Physical Exam: ?Vital Signs ?Blood pressure 91/74, pulse 69, temperature 98.1 ?F (36.7 ?C), temperature source Oral, resp. rate 18, height 5\' 5"  (1.651 m), weight 77.4 kg, SpO2 98 %. ? ? ?General: No acute distress ?Mood and affect are appropriate ?Heart: + LVAD hum ?Lungs: Clear to auscultation, breathing unlabored, no rales or wheezes ?Abdomen: Positive bowel sounds, soft nontender to palpation, nondistended ?Extremities: No clubbing, cyanosis, or edema ?Skin: No evidence of breakdown, no evidence of rash, RIght frontal EVD scalp wound healing well  ?Neurologic: Cranial nerves II through XII intact, motor strength is 5/5 in bilateral deltoid, bicep, tricep, grip, hip flexor, knee extensors, ankle dorsiflexor and plantar flexor ? ?Musculoskeletal: Full range of motion in all 4 extremities. No joint swelling ? ? ?Assessment/Plan: ?1. Functional deficits which require 3+ hours per day of interdisciplinary therapy in a comprehensive inpatient rehab setting. ?Physiatrist is providing close team supervision and 24 hour management of active medical problems listed below. ?Physiatrist and rehab team continue to assess barriers to discharge/monitor patient progress toward functional and medical goals ? ?Care Tool: ? ?Bathing ?   ?Body parts bathed by patient: Right arm, Left arm, Chest, Abdomen, Front perineal area,  Buttocks, Right upper leg, Left upper leg, Right lower leg, Left lower leg, Face  ?   ?  ?  ?Bathing assist Assist Level: Minimal Assistance - Patient > 75% (standing- per pt does this at home) ?  ?  ?Upper Body Dressing/Undressing ?Upper body dressing   ?What is the patient wearing?: Pull over shirt ?   ?Upper body assist Assist Level: Independent ?   ?Lower Body Dressing/Undressing ?Lower body dressing ? ? ?   ?What is the patient wearing?: Pants, Underwear/pull up ? ?  ? ?Lower body assist Assist for lower body dressing: Supervision/Verbal cueing ?   ? ?Toileting ?Toileting    ?Toileting assist Assist for toileting: Independent ?  ?  ?Transfers ?Chair/bed transfer ? ?Transfers assist ?   ? ?Chair/bed transfer assist level: Supervision/Verbal cueing ?  ?  ?Locomotion ?Ambulation ? ? ?Ambulation assist ? ?   ? ?Assist level: Supervision/Verbal cueing ?Assistive device: No Device ?Max distance: >300'  ? ?Walk 10 feet activity ? ? ?Assist ?   ? ?Assist level: Supervision/Verbal cueing ?Assistive device: No Device  ? ?Walk 50 feet activity ? ? ?Assist   ? ?Assist level: Supervision/Verbal cueing ?Assistive device: No Device  ? ? ?Walk 150 feet activity ? ? ?Assist   ? ?  Assist level: Supervision/Verbal cueing ?Assistive device: No Device ?  ? ?Walk 10 feet on uneven surface  ?activity ? ? ?Assist   ? ? ?Assist level: Contact Guard/Touching assist ?Assistive device: Other (comment) (no device)  ? ?Wheelchair ? ? ? ? ?Assist Is the patient using a wheelchair?: No ?  ?  ? ?  ?   ? ? ?Wheelchair 50 feet with 2 turns activity ? ? ? ?Assist ? ?  ?  ? ? ?   ? ?Wheelchair 150 feet activity  ? ? ? ?Assist ?   ? ? ?   ? ?Blood pressure 91/74, pulse 69, temperature 98.1 ?F (36.7 ?C), temperature source Oral, resp. rate 18, height 5\' 5"  (1.651 m), weight 77.4 kg, SpO2 98 %. ? ?Medical Problem List and Plan: ?1. 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            -patient may shower ?            -ELOS/Goals: S  07/10/21 ?             ?2.  Antithrombotics: ?-DVT/anticoagulation:  Pharmaceutical: Coumadin resumed 03/01.  ?            -antiplatelet therapy: N/A--off ASA due to AVM/GIB ?3. Headaches/Pain Management:  HA since bleed ?            --oxycodone effective and being tapered to bid with ultram additionally prn. Increase  topamax 50mg  BID ?            --gabapentin for neuropathic chest pain will d/c given trial of topirimate ?            --back pain since injury a few months ago  ?4. Mood: LCSW to follow for evaluation and support.  ?            -antipsychotic agents: N/A ?5. Neuropsych: This patient is capable of making decisions on her own behalf. ?6. Skin/Wound Care: scalp incision CDI healing well ?7. Fluids/Electrolytes/Nutrition: Strict I/O. ?-daily wts. Monitor for signs of overload.  ?8. CAD/ICM s/p LVAD/ICD: LVAD checks daily per protocol ?            --continue Cozaar, Lipitor and amiodarone. - as per CHF team  ?Vitals:  ? 07/02/21 0400 07/02/21 0518  ?BP: 96/76 91/74  ?Pulse:  69  ?Resp:  18  ?Temp:  98.1 ?F (36.7 ?C)  ?SpO2:  98%  ? ? ?9. H/o A fib: Monitor HR TID--continue amiodarone daily. Rate controlled  ?10. Urinary retention: Continue Flomax  ?--+BM 03/05-->d/c foley and monitor voiding function  ?11. Constipation: On Senna and miralax--augment as needed ?  ? ?LOS: ?4 days ?A FACE TO FACE EVALUATION WAS PERFORMED ? ?Luanna Salk Dimitra [place] ?07/02/2021, 7:52 AM  ? ? ? ?

## 2021-07-02 NOTE — Progress Notes (Signed)
Occupational Therapy Session Note ? ?Patient Details  ?Name: David [name] ?MRN: [id number] ?Date of Birth: Feb 28, 1966 ? ?Today's Date: 07/02/2021 ?OT Individual Time: 0800-0900 ?OT Individual Time Calculation (min): 60 min  ? ? ?Short Term Goals: ?Week 1:  OT Short Term Goal 1 (Week 1): STG=LTG d/t ELOS ? ?Skilled Therapeutic Interventions/Progress Updates:  ?   ?Pt received in bed with no report of HA this morning. ? ?ADL: ?Pt completes ADL at overall supervision-CGA Level. Skilled interventions include: VC for improved self organization/awareness when gathering ADL items, placement of ADL items on L for L attention, MIN A to manage LVAD wires, question cuing to build awareness of where driveline/remote is during ADLS. Pt gathers items at ambulatory level with increased time and cuing to locate bath cloths on L. Pt requires increased time for bowel and bladder void on toilet. Pt bathes sit to stand on toilet with supervision and dresses with cuing to sit to thread BLE into pants. Pt grooms in standing with set up at sink no L lean noted with fatigue this date. Pt  ? ?Pt left at end of session in bed with exit alarm on, call light in reach and all needs met ? ? ?Therapy Documentation ?Precautions:  ?Precautions ?Precautions: Fall, Other (comment) ?Precaution Comments: LVAD (x4 year hx), L inattention ?Restrictions ?Weight Bearing Restrictions: No ?Other Treatments:   ? ? ?Therapy/Group: Individual Therapy ? ?Lowella Dell Jadan Rouillard ?07/02/2021, 6:49 AM ?

## 2021-07-02 NOTE — Plan of Care (Signed)
?  Problem: RH PAIN MANAGEMENT ?Goal: RH STG PAIN MANAGED AT OR BELOW PT'S PAIN GOAL ?Description: At or below level 4 with prns ?Outcome: Not Progressing; c/o headache ?  ?

## 2021-07-03 LAB — BASIC METABOLIC PANEL
Anion gap: 8 (ref 5–15)
BUN: 10 mg/dL (ref 6–20)
CO2: 20 mmol/L — ABNORMAL LOW (ref 22–32)
Calcium: 8.8 mg/dL — ABNORMAL LOW (ref 8.9–10.3)
Chloride: 107 mmol/L (ref 98–111)
Creatinine, Ser: 1.54 mg/dL — ABNORMAL HIGH (ref 0.61–1.24)
GFR, Estimated: 52 mL/min — ABNORMAL LOW (ref >60)
Glucose, Bld: 98 mg/dL (ref 70–99)
Potassium: 4.2 mmol/L (ref 3.5–5.1)
Sodium: 135 mmol/L (ref 135–145)

## 2021-07-03 LAB — CBC
HCT: 39.7 % (ref 39.0–52.0)
Hemoglobin: 12.6 g/dL — ABNORMAL LOW (ref 13.0–17.0)
MCH: 26.7 pg (ref 26.0–34.0)
MCHC: 31.7 g/dL (ref 30.0–36.0)
MCV: 84.1 fL (ref 80.0–100.0)
Platelets: 184 10*3/uL (ref 150–400)
RBC: 4.72 MIL/uL (ref 4.22–5.81)
RDW: 14.6 % (ref 11.5–15.5)
WBC: 7.3 10*3/uL (ref 4.0–10.5)
nRBC: 0 % (ref 0.0–0.2)

## 2021-07-03 LAB — PROTIME-INR
INR: 1.8 — ABNORMAL HIGH (ref 0.8–1.2)
Prothrombin Time: 20.7 seconds — ABNORMAL HIGH (ref 11.4–15.2)

## 2021-07-03 LAB — LACTATE DEHYDROGENASE: LDH: 179 U/L (ref 98–192)

## 2021-07-03 MED ORDER — WARFARIN 0.5 MG HALF TABLET
1.5000 mg | ORAL_TABLET | Freq: Once | ORAL | Status: AC
  Administered 2021-07-03: 1.5 mg via ORAL
  Filled 2021-07-03: qty 1

## 2021-07-03 NOTE — Plan of Care (Signed)
?  Problem: RH Problem Solving ?Goal: LTG Patient will demonstrate problem solving for (SLP) ?Description: LTG:  Patient will demonstrate problem solving for basic/complex daily situations with cues  (SLP) ?Flowsheets (Taken 07/03/2021 1610) ?LTG Patient will demonstrate problem solving for: ? Minimal Assistance - Patient > 75% ? Moderate Assistance - Patient 50 - 74% ?Note: Goal downgraded due to slower than anticipated progress ?  ?Problem: RH Memory ?Goal: LTG Patient will use memory compensatory aids to (SLP) ?Description: LTG:  Patient will use memory compensatory aids to recall biographical/new, daily complex information with cues (SLP) ?Flowsheets (Taken 07/03/2021 1611) ?LTG: Patient will use memory compensatory aids to (SLP): Minimal Assistance - Patient > 75% ?Note: Goal downgraded due to slower than anticipated progress ?  ?Problem: RH Awareness ?Goal: LTG: Patient will demonstrate awareness during functional activites type of (SLP) ?Description: LTG: Patient will demonstrate awareness during functional activites type of (SLP) ?Flowsheets (Taken 07/03/2021 1611) ?Patient will demonstrate during cognitive/linguistic activities awareness type of: Intellectual ?LTG: Patient will demonstrate awareness during cognitive/linguistic activities with assistance of (SLP): Minimal Assistance - Patient > 75% ?Note: Goal downgraded due to slower than anticipated progress ?  ?Problem: RH Memory ?Goal: LTG Patient will demonstrate ability for day to day (SLP) ?Description: LTG:   Patient will demonstrate ability for day to day recall/carryover during cognitive/linguistic activities with assist  (SLP) ?Flowsheets (Taken 07/03/2021 1611) ?LTG: Patient will demonstrate ability for day to day recall/carryover during cognitive/linguistic activities with assist (SLP): Minimal Assistance - Patient > 75% ?Note: Goal downgraded due to slower than anticipated progress ?  ?

## 2021-07-03 NOTE — Progress Notes (Signed)
Physical Therapy Session Note ? ?Patient Details  ?Name: David [name] ?MRN: [id number] ?Date of Birth: [birth year] ? ?Today's Date: 07/03/2021 ?PT Individual Time: 2081-3887 ?PT Individual Time Calculation (min): 68 min  ? ?Short Term Goals: ?Week 1:  PT Short Term Goal 1 (Week 1): Pt will demonstrate standing functional transfers at supervision. ?PT Short Term Goal 2 (Week 1): Pt will ambulate with improved L foot clearance and improved attention to L with close supervision. ?PT Short Term Goal 3 (Week 1): Pt will ascend/ descend at least 4 steps using no HR with supervision. ?PT Short Term Goal 4 (Week 1): Pt will complete Functional Gait Assessment. ?PT Short Term Goal 5 (Week 1): Pt will complete Berg Balance Test. ? ?Skilled Therapeutic Interventions/Progress Updates:  ? ?Pt received supine in bed and agreeable to PT. Supine>sit transfer with supervision assist and cues for awareness of LVAD line position to improve safety. PT assisted pt to connect batteries per pt request and don battery sling.  ? ?PT instructed in path finding task through hospital to locate gift shop, entr[facility], main entrance of hospital and return to room. Mod-max directional instruction as questioning cues for use of signs in hospital were insufficient to locate all desired areas.  ? ?When returned to room. Uration with distant supervision assist, Hand hygiene at sink without assist. Callling wife from room phone with assist from PT to due to poor management of phone controls.Pt performed packing personal belongings  reporting that he was discharging once wife showed up, Pt reports d/c date of 3/11, but all information in epic and communication with [facility] lists d/c date at 3/14. Pt educated on d/c plan, with no evidence of retention. Left sitting EOB with all needs met.   ? ?   ? ?Therapy Documentation ?Precautions:  ?Precautions ?Precautions: Fall, Other (comment) ?Precaution Comments: LVAD (x4 year hx), L  inattention ?Restrictions ?Weight Bearing Restrictions: No ? ?  ?Vital Signs: ?Therapy Vitals ?Temp: 98.2 ?F (36.8 ?C) ?Temp Source: Oral ?Pulse Rate: 81 ?Resp: 16 ?Patient Position (if appropriate): Sitting ?Pain: ?Pain Assessment ?Pain Scale: 0-10 ?Pain Score: 0-No pain ? ? ? ? ?Therapy/Group: Individual Therapy ? ?Lorie Phenix ?07/03/2021, 4:11 PM  ?

## 2021-07-03 NOTE — Progress Notes (Signed)
ANTICOAGULATION CONSULT NOTE ? ?Pharmacy Consult for warfarin ?Indication:  LVAD ? ?Allergies  ?Allergen Reactions  ? Plavix [Clopidogrel Bisulfate] Hives  ? ? ?Patient Measurements: ?Height: 5\' 5"  (165.1 cm) ?Weight: 76.8 kg (169 lb 5 oz) ?IBW/kg (Calculated) : 61.5 ? ?Vital Signs: ?Temp: 97.7 ?F (36.5 ?C) (03/11 0747) ?Temp Source: Oral (03/11 0747) ?BP: 113/102 (03/11 0747) ?Pulse Rate: 84 (03/11 0747) ? ?Labs: ?Recent Labs  ?  07/01/21 ?0559 07/02/21 ?8469 07/03/21 ?6295  ?HGB  --   --  12.6*  ?HCT  --   --  39.7  ?PLT  --   --  184  ?LABPROT 20.3* 20.6* 20.7*  ?INR 1.7* 1.8* 1.8*  ?CREATININE 1.44* 1.63* 1.54*  ? ? ? ?Estimated Creatinine Clearance: 50.6 mL/min (A) (by C-G formula based on SCr of 1.54 mg/dL (H)). ? ? ?Medical History: ?Past Medical History:  ?Diagnosis Date  ? AICD (automatic cardioverter/defibrillator) present   ? Anemia   ? Anxiety   ? CAD (coronary artery disease) 2009  ? AMI in 12/2007 with PCI to LAD, staged PCI to  mid/distal RCA, NSTEMI in 02/2009 with BMS to LCx  ? CHF (congestive heart failure) ([place])   ? Chronic kidney disease 11/03/2016  ? stage 3 kidney disease  ? Dysrhythmia   ? "arrythmia problems at some point", "fatal rhythms"  ? History of blood transfusion   ? "I was bleeding from was rectum" (04/19/2017)  ? HLD (hyperlipidemia)   ? HTN (hypertension)   ? Hyperlipidemia 03/15/2011  ? Ischemic cardiomyopathy   ? Admitted in 07/2010 with CHF exacerbation  ? Myocardial infarction [facility])   ? "I've had 4" (04/19/2017)  ? Pneumonia 2018 X 1  ? Seizures ([place])   ? one seizure in 04/2016 during cardiac event (04/19/2017)  ? STEMI 2009 (anterior), 2010 (lateral), and 2012 (inferior) 03/14/2011  ? ? ?Assessment: ?18 yoM on warfarin PTA for hx HM3 LVAD. Pt admitted 2/11 with ICH requiring ventriculostomy. INR 2.4 on admit reversed with vitamin K and 4FPCC. Ventriculostomy drain removed 2/25. ? ?Warfarin ordered to resume 2/27 but not given until 3/1, head CT stable.  ?INR 1.8 therapeutic  today.  ? ?*Home warfarin dose 4mg  Fri, 2mg  AODs ? ?Goal of Therapy:  ?INR 1.5-2.0 (given ICH on warfarin within therapeutic range) ?Monitor platelets by anticoagulation protocol: Yes ?  ?Plan:  ?-Warfarin 1.5mg  PO x1 ?-Daily INR ?-CBC every 72h ordered ? ?Laurey Arrow, PharmD ?PGY1 Pharmacy Resident ?07/03/2021  1:52 PM ? ?Please check [email or web address] for unit-specific pharmacy phone numbers. ? ? ? ? ? ?

## 2021-07-03 NOTE — Progress Notes (Signed)
?Advanced Heart Failure VAD Team Note ? ?PCP-Cardiologist: Glori Bickers, MD  ? ?Subjective:   ? ?2/11 Presented with ICH ?2/11 Underwent bedside ventriculostomy wit NSU ?2/12 Repeat head CT with resolution of hydrocephalus. Persistent hematoma ?2/17 Repeat CT head with increased hemorrhage in right lateral ventricle, slightly increased ventricular dilation.  The ventriculostomy drain is occluded.  ?2/18 Ventriculostomy drain replaced ?2/25 CT improved ventriculomegaly. Drain pulled ?2/27 F/u CT w/ stable intraparenchymal hemorrhage. Unchanged from prior.    ?3/1 Coumadin restarted  ?3/4 Confusion. Head CT no change from prior ?3/6 Discharged to CIR  ? ? ?Doing well. Mild HA. No change. INR 1.8. MAPs mostly 80s. Today 104 ? ?No SOB.  ? ?LVAD INTERROGATION:  ?HeartMate III LVAD:   ?Flow 4.3 liters/min, speed 5400, power 4.0  PI 5.9 VAD interrogated personally. Parameters stable..  ? ?Objective:   ? ?Vital Signs:   ?Temp:  [97.7 ?F (36.5 ?C)-98.6 ?F (37 ?C)] 97.7 ?F (36.5 ?C) (03/11 0747) ?Pulse Rate:  [66-84] 84 (03/11 0747) ?Resp:  [14-16] 14 (03/11 0410) ?BP: (83-113)/(73-102) 113/102 (03/11 0747) ?SpO2:  [97 %-100 %] 100 % (03/11 0747) ?Weight:  [76.8 kg] 76.8 kg (03/11 0415) ?Last BM Date : 07/02/21 ?Mean arterial Pressure 80s  ? ?Intake/Output:  ? ?Intake/Output Summary (Last 24 hours) at 07/03/2021 1251 ?Last data filed at 07/03/2021 0800 ?Gross per 24 hour  ?Intake 118 ml  ?Output --  ?Net 118 ml  ? ?  ? ?Physical Exam  ? ?General:  NAD.  ?HEENT: normal  ?Neck: supple. JVP not elevated.  Carotids 2+ bilat; no bruits. No lymphadenopathy or thryomegaly appreciated. ?Cor: LVAD hum.  ?Lungs: Clear. ?Abdomen: soft, nontender, non-distended. No hepatosplenomegaly. No bruits or masses. Good bowel sounds. Driveline site clean. Anchor in place.  ?Extremities: no cyanosis, clubbing, rash. Warm no edema  ?Neuro: alert & oriented x 3. No focal deficits. Moves all 4 without problem  ?   ? ?Labs  ? ?Basic Metabolic  Panel: ?Recent Labs  ?Lab 06/28/21 ?1791 06/29/21 ?5056 06/30/21 ?9794 07/01/21 ?0559 07/02/21 ?8016 07/03/21 ?5537  ?NA 134*  --  137 136 137 135  ?K 3.7  --  4.4 3.8 4.6 4.2  ?CL 103  --  106 104 107 107  ?CO2 24  --  23 24 23  20*  ?GLUCOSE 91  --  83 82 87 98  ?BUN 11  --  7 9 9 10   ?CREATININE 1.32*  --  1.45* 1.44* 1.63* 1.54*  ?CALCIUM 8.4*  --  8.6* 8.6* 8.9 8.8*  ?MG 1.8 1.6* 1.9  --   --   --   ? ? ? ?Liver Function Tests: ?No results for input(s): AST, ALT, ALKPHOS, BILITOT, PROT, ALBUMIN in the last 168 hours. ?No results for input(s): LIPASE, AMYLASE in the last 168 hours. ?No results for input(s): AMMONIA in the last 168 hours. ? ?CBC: ?Recent Labs  ?Lab 06/27/21 ?4827 06/28/21 ?0786 07/03/21 ?7544  ?WBC 7.3 6.8 7.3  ?HGB 12.1* 12.3* 12.6*  ?HCT 37.9* 38.0* 39.7  ?MCV 84.2 83.5 84.1  ?PLT 193 183 184  ? ? ? ?INR: ?Recent Labs  ?Lab 06/29/21 ?9201 06/30/21 ?0071 07/01/21 ?0559 07/02/21 ?2197 07/03/21 ?5883  ?INR 1.5* 1.7* 1.7* 1.8* 1.8*  ? ? ? ?Other results: ?EKG:  ? ? ?Imaging  ? ?No results found. ? ? ?Medications:   ? ? ?Scheduled Medications: ? amiodarone  200 mg Oral Daily  ? atorvastatin  40 mg Oral Daily  ? Chlorhexidine Gluconate Cloth  6 each Topical Daily  ? feeding supplement  1 Container Oral TID BM  ? losartan  25 mg Oral Daily  ? magnesium oxide  400 mg Oral BID  ? mouth rinse  15 mL Mouth Rinse BID  ? multivitamin with minerals  1 tablet Oral Daily  ? pantoprazole  40 mg Oral Daily  ? polyethylene glycol  17 g Oral Daily  ? senna-docusate  2 tablet Oral QPC supper  ? tamsulosin  0.4 mg Oral Daily  ? topiramate  50 mg Oral BID  ? traZODone  50 mg Oral QHS  ? Warfarin - Pharmacist Dosing Inpatient   Does not apply Z0017  ? ? ?Infusions: ? ? ?PRN Medications: ?acetaminophen, alum & mag hydroxide-simeth, bisacodyl, diphenhydrAMINE, guaiFENesin-dextromethorphan, hydrALAZINE, ipratropium-albuterol, lidocaine, oxyCODONE, polyethylene glycol, polyvinyl alcohol, prochlorperazine **OR**  prochlorperazine **OR** prochlorperazine, sodium phosphate, sorbitol, traMADol, traZODone ? ? ? ?Assessment/Plan:   ? ?1. Intracerebral hemorrhage ?- CT brain with intracerebral and intraventricular hemorrhage with obstructive hydrocephalus. INR 2.4 ?- AC reversed with Kcentra/vit k in ER ?- Underwent bedside ventriculostomy on 2/11 ?- Initial ventriculostomy drain occluded. CT head 2/17 with increased hemorrhage in right lateral ventricle, slightly increased ventricular dilation.  On 2/18, ventriculostomy drain replaced.  ?- CT 2/25 improved ventriculomegaly. Drain pulled 2/25 ?- CT 2/27 stable intraparenchymal hemorrhage in the frontal lobe and basal ganglia on the right. Midline shift is slightly reduced from the prior exam. ?- Increased confusion on 03/04. CT with stable intraparenchymal hemorrhage.  ?- Stable. Continue with mild HA.  ?- On coumadin.   ?  ?2. Chronic systolic CHF with HM III LVAD implant 04/20/17:  ?- Has done well with VAD support. ?- He has been seen at [facility] for transplant referral but has decided not to pursue transplant. No change ?- New oxygen requirement with pulmonary edema on 2/17 CXR => good diuresis on 2/18.  ?-  2/27 and 2/28 Coumadin ordered not but given. Started 03/01. INR 1.8 ?today. Discussed dosing with PharmD personally. ?-  Volume status stable. Does not need diuretics.  ?- Continue current dose of losartan. MAPS mostly 80s. One reading of 104 today. Will follow. Goal 70-90.  ?- LDH stable.  ?  ?3. Paroxysmal AFL with RVR ?- had DC-CV on 10/17/18 with subsequent short episodes of AF after but has been mostly in NSR on amio ?- Had recurrent AF in 10/22.  ?- not on the monitor. Rate controlled.  ?- Continue amio 200 mg daily ?- Continue coumadin.  ?  ?4. h/o UGI AVM bleed ?- s/p APC x 4 lesions in 2/19 ?- No recurrent bleeding  ?- Continue PPI ?- Off ASA due to GIB  ?  ?5. AKI on CKD Stage IIIa:  ?- Creatinine baseline 1.3 - 1.7 ?- SCr 1.5 today ?  ?6. VAD management ?-  VAD  interrogated personally. Parameters stable. ?- LDH has been stable. 179 today ?- INR goal 1.5-2.0 Off ASA due to GIB 2/19.  ?- INR 2.4 reversed due to [place]. INR 11.8 today ?- Continue coumadin, no heparin ?- Maps/LDH stable. Continue losartan. MAPS mostly 80s. One reading of 104 today. Will follow. Goal 70-90.  ?- DL site looks good ?  ?6. HTN ?- goal MAP 70-90 with ICH  ?- losartan increased 2/14 with resultant hypotension and AKI. Hydralazine stopped.  ?- Losartan decreased to 25 mg daily on 02/18 d/t bump in Scr.  ?- MAP stable. See above ?  ?7. CAD:   ?- s/p PCI RCA/PLOM with DES  x2 and DES to mild LAD 1/18 ?- No s/s angina  ?- Continue statin. Off ASA due to GIB ?  ?8. Urinary retention ?- continue Flomax  ?- Foley replaced 03/01 ?  ?9. Hypomagnesemia ?- Continue PO Mag supp  ? ?Rehab staff appreciated.  ? ?I reviewed the LVAD parameters from today, and compared the results to the patient's prior recorded data.  No programming changes were made.  The LVAD is functioning within specified parameters.  The patient performs LVAD self-test daily.  LVAD interrogation was negative for any significant power changes, alarms or PI events/speed drops.  LVAD equipment check completed and is in good working order.  Back-up equipment present.   LVAD education done on emergency procedures and precautions and reviewed exit site care. ? ?Length of Stay: 5 ? ?Glori Bickers, MD ?07/03/2021, 12:51 PM ? ?VAD Team --- VAD ISSUES ONLY--- ?Pager [phone] (7am - 7am) ? ?Advanced Heart Failure Team  ?Pager [phone] (M-F; 7a - 5p)  ?Please contact [facility] for night-coverage after hours (5p -7a ) and weekends on [email or web address] ? ? ? ? ? ? ? ?  ?

## 2021-07-03 NOTE — Progress Notes (Signed)
Speech Language Pathology Daily Session Note ? ?Patient Details  ?Name: David [name] ?MRN: [id number] ?Date of Birth: 04/13/1966 ? ?Today's Date: 07/03/2021 ?SLP Individual Time: 1300-1400 ?SLP Individual Time Calculation (min): 60 min ? ?Short Term Goals: ?Week 1: SLP Short Term Goal 1 (Week 1): STG=LTG due to ELOS ? ?Skilled Therapeutic Interventions: Skilled ST treatment focused on cognitive goals. Patient was sleeping supine on arrival and roused to min verbal stimuli. Pt required overall increased processing time, verbal redirection, and cues for recall this date as compared to previus encounters.  Pt did not recall participating in either physical and occupational therapy sessions this morning. Pt exhibited confusion regarding discharge date and stated he packed up his belongings because he was supposed to discharge yesterday morning (not accurate). SLP reinforced anticipated discharge date. Approximately 10 minutes later pt stated he would be discharging tomorrow with minimal-to-no recall of discussion earlier. Pt was oriented to person/place/situation but required min A cues to orient to time. MD from LVAD team arrived during session to review parameters on monitor. Following MD's exit, this sparked conversation regarding pt's LVAD management. Pt required more than reasonable amount of processing time and mod A cues for recalling management of device and sequencing for battery change. Pt's spouse and mother-in-law arrived mid session. SLP educated re: cognitive-communication deficits, strategies, and recommendations for follow-up speech therapy. Pt with significantly reduced insight into deficits, especially from a cognitive perspective. This raises concern for safety and it is highly recommended he receive supervision with ADLs/iADLs at discharge. Educated spouse regarding the importance of close supervision due to limited insight. Further family education is recommended, as suspect family may not  recognize extent of cognitive deficits and impact on patient safety. Patient was left in bed with alarm activated and immediate needs within reach at end of session. Continue per current plan of care.   ?   ?Pain ?Pain Assessment ?Pain Scale: 0-10 ?Pain Score: 0-No pain ? ?Therapy/Group: Individual Therapy ? ?Shirin Echeverry T Cherice Glennie ?07/03/2021, 1:14 PM ?

## 2021-07-03 NOTE — Progress Notes (Incomplete)
Speech Language Pathology Discharge Summary ° °Patient Details  °Name: Tevin D Smartt °MRN: [id number] °Date of Birth: 04/15/1966 ° °{chl ip rehab slp time calculations:[id number]} ° ° °Skilled Therapeutic Interventions:  ***  ° °Patient has met   of 4 long term goals.  Patient to discharge at overall   level.  °Reasons goals not met:    ° °Clinical Impression/Discharge Summary: Patient has made *** gains and has met _ of 4 long-term goals this admission. Patient is currently an overall *** for cognitive tasks in regards to problem solving, functional recall, and intellectual awareness. Pt continues to exhibit decreased awareness of deficits, especially as they pertain to cognitive-linguistic skills. Patient and family education is complete and patient to discharge at overall *** level. Patient's care partner is independent to provide the necessary physical and cognitive assistance at discharge. It has been discussed that patient will require close supervision for completion of iADLs. Patient would benefit from continued SLP services in an outpatient setting to maximize cognitive function, safety, and functional independence.   ° °Care Partner:  °Caregiver Able to Provide Assistance: Yes  °Type of Caregiver Assistance: Cognitive;Physical ° °Recommendation:  °Outpatient SLP;24 hour supervision/assistance  °Rationale for SLP Follow Up: Maximize cognitive function and independence;Reduce caregiver burden  ° °Equipment: None  ° °Reasons for discharge: Discharged from hospital  ° °Patient/Family Agrees with Progress Made and Goals Achieved: Yes  ° ° ° T  °07/03/2021, 4:13 PM ° °

## 2021-07-03 NOTE — Progress Notes (Signed)
Occupational Therapy Session Note ? ?Patient Details  ?Name: David [name] ?MRN: [id number] ?Date of Birth: [birth year] ? ?Today's Date: 07/03/2021 ?OT Individual Time: 0845-1000 ?OT Individual Time Calculation (min): 75 min  ? ? ?Short Term Goals: ?Week 1:  OT Short Term Goal 1 (Week 1): STG=LTG d/t ELOS ? ?Skilled Therapeutic Interventions/Progress Updates:  ?   ?Pt received in bed with premedicated HA. Rest provided. ? ?ADL: ?Pt completes ADL at overall supervision Level. Skilled interventions include: decreased assistance with cord management in room and increased question cuing to notice safety issues throughout ADL. Overall increased processing time today and demo distractions externally (phone ringing) impacting continuation of task (managing remote into carrier after self test). Pt demo poor dual task today having to pause searching for ADL items when on phone with wife. Pt toilets with supervision and increased time, Vc for not licking fingers to separate toilet paper with hand wipes buttocks with for hygiene. Pt grooms at sink with set up and dresses EOB. Pt with improves safety during LB dressing sitting without cuing this date to thread BLE. ? ?Pt with decreased processing speed this date and decreased problem solving. Pt reporting not sleeping as well this evening, and OT edu re cognitive fatigue. ? ?Pt left at end of session in bed with exit alarm on, call light in reach and all needs met ? ? ?Therapy Documentation ?Precautions:  ?Precautions ?Precautions: Fall, Other (comment) ?Precaution Comments: LVAD (x4 year hx), L inattention ?Restrictions ?Weight Bearing Restrictions: No ?General: ?  ?   ? ? ?Therapy/Group: Individual Therapy ? ?Lowella Dell Neaveh Belanger ?07/03/2021, 6:51 AM ?

## 2021-07-04 LAB — LACTATE DEHYDROGENASE: LDH: 180 U/L (ref 98–192)

## 2021-07-04 LAB — BASIC METABOLIC PANEL
Anion gap: 8 (ref 5–15)
BUN: 9 mg/dL (ref 6–20)
CO2: 18 mmol/L — ABNORMAL LOW (ref 22–32)
Calcium: 8.8 mg/dL — ABNORMAL LOW (ref 8.9–10.3)
Chloride: 110 mmol/L (ref 98–111)
Creatinine, Ser: 1.45 mg/dL — ABNORMAL HIGH (ref 0.61–1.24)
GFR, Estimated: 56 mL/min — ABNORMAL LOW (ref >60)
Glucose, Bld: 87 mg/dL (ref 70–99)
Potassium: 4.1 mmol/L (ref 3.5–5.1)
Sodium: 136 mmol/L (ref 135–145)

## 2021-07-04 LAB — PROTIME-INR
INR: 1.8 — ABNORMAL HIGH (ref 0.8–1.2)
Prothrombin Time: 21.3 seconds — ABNORMAL HIGH (ref 11.4–15.2)

## 2021-07-04 MED ORDER — WARFARIN SODIUM 1 MG PO TABS
1.5000 mg | ORAL_TABLET | Freq: Once | ORAL | Status: AC
  Administered 2021-07-04: 1.5 mg via ORAL
  Filled 2021-07-04: qty 1

## 2021-07-04 NOTE — Progress Notes (Signed)
Physical Therapy Session Note ? ?Patient Details  ?Name: David [name] ?MRN: [id number] ?Date of Birth: 1965/10/30 ? ?Today's Date: 07/04/2021 ?PT Individual Time: 1000-1100 ?PT Individual Time Calculation (min): 60 min  ? ?Short Term Goals: ?Week 1:  PT Short Term Goal 1 (Week 1): Pt will demonstrate standing functional transfers at supervision. ?PT Short Term Goal 2 (Week 1): Pt will ambulate with improved L foot clearance and improved attention to L with close supervision. ?PT Short Term Goal 3 (Week 1): Pt will ascend/ descend at least 4 steps using no HR with supervision. ?PT Short Term Goal 4 (Week 1): Pt will complete Functional Gait Assessment. ?PT Short Term Goal 5 (Week 1): Pt will complete Berg Balance Test. ? ?Skilled Therapeutic Interventions/Progress Updates:  ?  Pt agreeable to session. Supervision to come to EOB for safety with attention to cords for LVAD. Donned shoes EOB without assistance including tying laces and fixing a knot. Pt impulsive urge to go to bathroom and got up as we were getting prepared to transition to battery power. PT assisted with management of cords for safety. Pt took extended amount of time for managing clothing (stated his underwear was stuck but that we could do it) and was able to urinate and complete with supervision and extra time. Pt performed hand hygiene at sink and got his own washcloth to wash his face indepenently. PT assisted with transition to battery pack for time management. Pt carried travel bag in hallway > 300' down to therapy gym at supervision level with intermittent cues for attention to L as sometimes veered close to doorways or obstacles. Engaged in [employer] for functional dynamic standing balance activity while on compliant foam surface. Pt did require cues for identifying whose turn it was. When given time, pt did not initiate turn change. Pt able to manage controls and recall from last time he performed this game. Returned to room in same fashion  as above and reconnected to wall unit.  ? ?Therapy Documentation ?Precautions:  ?Precautions ?Precautions: Fall, Other (comment) ?Precaution Comments: LVAD (x4 year hx), L inattention ?Restrictions ?Weight Bearing Restrictions: No ? ?  ?Pain: ? Denies pain. ? ? ? ?Therapy/Group: Individual Therapy ? ?Allayne Gitelman ?Lars Masson, PT, DPT, CBIS ? ?07/04/2021, 12:24 PM  ?

## 2021-07-04 NOTE — Progress Notes (Addendum)
Notified wife David [name]) related to educational session  prior to husband discharging on Tuesday, her request is to have the therapist call in the morning to training to be done on Tuesday morning if possible, will inform oncoming shift this information ? ?2030 ?Patient agitated and informed ny assigned NT that he is trying to pull om cords on IVAD machine , stating he is "wanting to go home, waiting on his wife David [name].. Patient is disoriented to time and appears unaware of prior conversation with Probation officer regarding discharge pending plans for Tuesday. Writer was able to redirect with conversation and coaching patient back to bed and relaxing techniques in place.Encourage po  and consumed liquids and request medication to help him sleep. Monitor closely,bed alarm setting readjusted and call bell within reach. ? ? ? ?

## 2021-07-04 NOTE — Progress Notes (Addendum)
?Advanced Heart Failure VAD Team Note ? ?PCP-Cardiologist: Glori Bickers, MD  ? ?Subjective:   ? ?2/11 Presented with ICH ?2/11 Underwent bedside ventriculostomy wit NSU ?2/12 Repeat head CT with resolution of hydrocephalus. Persistent hematoma ?2/17 Repeat CT head with increased hemorrhage in right lateral ventricle, slightly increased ventricular dilation.  The ventriculostomy drain is occluded.  ?2/18 Ventriculostomy drain replaced ?2/25 CT improved ventriculomegaly. Drain pulled ?2/27 F/u CT w/ stable intraparenchymal hemorrhage. Unchanged from prior.    ?3/1 Coumadin restarted  ?3/4 Confusion. Head CT no change from prior ?3/6 Discharged to CIR  ? ?Doing well. No SOB, orthopnea or PND. Working with PT.  ? ?LVAD INTERROGATION:  ?HeartMate III LVAD:   ?Flow 4.2 liters/min, speed 5400, power 4.1  PI 6.9 VAD interrogated personally. Parameters stable. ? ?Objective:   ? ?Vital Signs:   ?Temp:  [97.9 ?F (36.6 ?C)-99 ?F (37.2 ?C)] 98.6 ?F (37 ?C) (03/12 1245) ?Pulse Rate:  [79-83] 83 (03/12 1245) ?Resp:  [14-18] 18 (03/12 1245) ?BP: (96-124)/(74-89) 102/74 (03/12 0802) ?SpO2:  [97 %-98 %] 98 % (03/12 1245) ?Weight:  [77.4 kg] 77.4 kg (03/12 0439) ?Last BM Date : 07/02/21 ?Mean arterial Pressure 80s ? ?Intake/Output:  ? ?Intake/Output Summary (Last 24 hours) at 07/04/2021 1459 ?Last data filed at 07/04/2021 1325 ?Gross per 24 hour  ?Intake 320 ml  ?Output --  ?Net 320 ml  ? ?  ? ?Physical Exam  ? ?General:  NAD.  ?HEENT: normal  ?Neck: supple. JVP not elevated.  Carotids 2+ bilat; no bruits. No lymphadenopathy or thryomegaly appreciated. ?Cor: LVAD hum.  ?Lungs: Clear. ?Abdomen: soft, nontender, non-distended. No hepatosplenomegaly. No bruits or masses. Good bowel sounds. Driveline site clean. Anchor in place.  ?Extremities: no cyanosis, clubbing, rash. Warm no edema  ?Neuro: alert & oriented x 3. No focal deficits. Moves all 4 without problem  ? ? ?Labs  ? ?Basic Metabolic Panel: ?Recent Labs  ?Lab 06/28/21 ?7741  06/29/21 ?2878 06/30/21 ?6767 07/01/21 ?0559 07/02/21 ?2094 07/03/21 ?7096 07/04/21 ?0734  ?NA 134*  --  137 136 137 135 136  ?K 3.7  --  4.4 3.8 4.6 4.2 4.1  ?CL 103  --  106 104 107 107 110  ?CO2 24  --  23 24 23  20* 18*  ?GLUCOSE 91  --  83 82 87 98 87  ?BUN 11  --  7 9 9 10 9   ?CREATININE 1.32*  --  1.45* 1.44* 1.63* 1.54* 1.45*  ?CALCIUM 8.4*  --  8.6* 8.6* 8.9 8.8* 8.8*  ?MG 1.8 1.6* 1.9  --   --   --   --   ? ? ? ?Liver Function Tests: ?No results for input(s): AST, ALT, ALKPHOS, BILITOT, PROT, ALBUMIN in the last 168 hours. ?No results for input(s): LIPASE, AMYLASE in the last 168 hours. ?No results for input(s): AMMONIA in the last 168 hours. ? ?CBC: ?Recent Labs  ?Lab 06/28/21 ?2836 07/03/21 ?6294  ?WBC 6.8 7.3  ?HGB 12.3* 12.6*  ?HCT 38.0* 39.7  ?MCV 83.5 84.1  ?PLT 183 184  ? ? ? ?INR: ?Recent Labs  ?Lab 06/30/21 ?0623 07/01/21 ?0559 07/02/21 ?7654 07/03/21 ?6503 07/04/21 ?0734  ?INR 1.7* 1.7* 1.8* 1.8* 1.8*  ? ? ? ?Other results: ? ? ?Imaging  ? ?No results found. ? ? ?Medications:   ? ? ?Scheduled Medications: ? amiodarone  200 mg Oral Daily  ? atorvastatin  40 mg Oral Daily  ? Chlorhexidine Gluconate Cloth  6 each Topical Daily  ? feeding  supplement  1 Container Oral TID BM  ? losartan  25 mg Oral Daily  ? magnesium oxide  400 mg Oral BID  ? mouth rinse  15 mL Mouth Rinse BID  ? multivitamin with minerals  1 tablet Oral Daily  ? pantoprazole  40 mg Oral Daily  ? polyethylene glycol  17 g Oral Daily  ? senna-docusate  2 tablet Oral QPC supper  ? tamsulosin  0.4 mg Oral Daily  ? topiramate  50 mg Oral BID  ? traZODone  50 mg Oral QHS  ? warfarin  1.5 mg Oral ONCE-1600  ? Warfarin - Pharmacist Dosing Inpatient   Does not apply U2025  ? ? ?Infusions: ? ? ?PRN Medications: ?acetaminophen, alum & mag hydroxide-simeth, bisacodyl, diphenhydrAMINE, guaiFENesin-dextromethorphan, hydrALAZINE, ipratropium-albuterol, lidocaine, oxyCODONE, polyethylene glycol, polyvinyl alcohol, prochlorperazine **OR**  prochlorperazine **OR** prochlorperazine, sodium phosphate, sorbitol, traMADol, traZODone ? ? ? ?Assessment/Plan:   ? ?1. Intracerebral hemorrhage ?- CT brain with intracerebral and intraventricular hemorrhage with obstructive hydrocephalus. INR 2.4 ?- AC reversed with Kcentra/vit k in ER ?- Underwent bedside ventriculostomy on 2/11 ?- Initial ventriculostomy drain occluded. CT head 2/17 with increased hemorrhage in right lateral ventricle, slightly increased ventricular dilation.  On 2/18, ventriculostomy drain replaced.  ?- CT 2/25 improved ventriculomegaly. Drain pulled 2/25 ?- CT 2/27 stable intraparenchymal hemorrhage in the frontal lobe and basal ganglia on the right. Midline shift is slightly reduced from the prior exam. ?- Increased confusion on 03/04. CT with stable intraparenchymal hemorrhage.  ?- Stable. Continue with mild HA.  ?- On coumadin. INR 1.8 ?  ?2. Chronic systolic CHF with HM III LVAD implant 04/20/17:  ?- Has done well with VAD support. ?- He has been seen at [facility] for transplant referral but has decided not to pursue transplant. No change ?- New oxygen requirement with pulmonary edema on 2/17 CXR => good diuresis on 2/18.  ?-  2/27 and 2/28 Coumadin ordered not but given. Started 03/01. INR 1.8 ?today. Discussed dosing with PharmD personally. ?-  Volume status stable. Does not need diuretics.  ?- Continue current dose of losartan. MAPs 80s. LDH ok ?  ?3. Paroxysmal AFL with RVR ?- had DC-CV on 10/17/18 with subsequent short episodes of AF after but has been mostly in NSR on amio ?- Had recurrent AF in 10/22.  ?- not on the monitor. Rate controlled.  ?- Continue amio 200 mg daily ?- Continue coumadin.  ?  ?4. h/o UGI AVM bleed ?- s/p APC x 4 lesions in 2/19 ?- No recurrent bleeding  ?- Continue PPI ?- Off ASA due to GIB  ?  ?5. AKI on CKD Stage IIIa:  ?- Creatinine baseline 1.3 - 1.7 ?- SCr 1.45 today ?  ?6. VAD management ?-  VAD interrogated personally. Parameters stable. ?- LDH has been  stable. 179 today ?- INR goal 1.5-2.0 Off ASA due to GIB 2/19.  ?- INR 2.4 reversed due to [place]. INR 1.8 today ?- Continue coumadin ?- Maps/LDH stable. MAPs 80s ?- DL site looks good ?  ?6. HTN ?- goal MAP 70-90 with ICH  ?- losartan increased 2/14 with resultant hypotension and AKI. Hydralazine stopped.  ?- Losartan decreased to 25 mg daily on 02/18 d/t bump in Scr.  ?- MAP stable. See above ?  ?7. CAD:   ?- s/p PCI RCA/PLOM with DES x2 and DES to mild LAD 1/18 ?- No s/s angina ?- Continue statin. Off ASA due to GIB ?  ?8. Urinary retention ?- continue Flomax  ?-  Foley out ?  ?9. Hypomagnesemia ?- Continue PO Mag supp  ? ?Rehab staff appreciated.  ? ?I reviewed the LVAD parameters from today, and compared the results to the patient's prior recorded data.  No programming changes were made.  The LVAD is functioning within specified parameters.  The patient performs LVAD self-test daily.  LVAD interrogation was negative for any significant power changes, alarms or PI events/speed drops.  LVAD equipment check completed and is in good working order.  Back-up equipment present.   LVAD education done on emergency procedures and precautions and reviewed exit site care. ? ?Length of Stay: 6 ? ?Glori Bickers, MD ?07/04/2021, 2:59 PM ? ?VAD Team --- VAD ISSUES ONLY--- ?Pager [phone] (7am - 7am) ? ?Advanced Heart Failure Team  ?Pager [phone] (M-F; 7a - 5p)  ?Please contact [facility] for night-coverage after hours (5p -7a ) and weekends on [email or web address] ? ? ? ? ? ? ? ?  ?

## 2021-07-04 NOTE — Progress Notes (Signed)
ANTICOAGULATION CONSULT NOTE ? ?Pharmacy Consult for warfarin ?Indication:  LVAD ? ?Allergies  ?Allergen Reactions  ? Plavix [Clopidogrel Bisulfate] Hives  ? ? ?Patient Measurements: ?Height: 5\' 5"  (165.1 cm) ?Weight: 77.4 kg (170 lb 10.2 oz) ?IBW/kg (Calculated) : 61.5 ? ?Vital Signs: ?Temp: 98.5 ?F (36.9 ?C) (03/12 0802) ?Temp Source: Oral (03/12 0802) ?BP: 102/74 (03/12 0802) ?Pulse Rate: 83 (03/12 0802) ? ?Labs: ?Recent Labs  ?  07/02/21 ?0712 07/03/21 ?5809 07/04/21 ?0734  ?HGB  --  12.6*  --   ?HCT  --  39.7  --   ?PLT  --  184  --   ?LABPROT 20.6* 20.7* 21.3*  ?INR 1.8* 1.8* 1.8*  ?CREATININE 1.63* 1.54* 1.45*  ? ? ? ?Estimated Creatinine Clearance: 54 mL/min (A) (by C-G formula based on SCr of 1.45 mg/dL (H)). ? ? ?Medical History: ?Past Medical History:  ?Diagnosis Date  ? AICD (automatic cardioverter/defibrillator) present   ? Anemia   ? Anxiety   ? CAD (coronary artery disease) 2009  ? AMI in 12/2007 with PCI to LAD, staged PCI to  mid/distal RCA, NSTEMI in 02/2009 with BMS to LCx  ? CHF (congestive heart failure) ([place])   ? Chronic kidney disease 11/03/2016  ? stage 3 kidney disease  ? Dysrhythmia   ? "arrythmia problems at some point", "fatal rhythms"  ? History of blood transfusion   ? "I was bleeding from was rectum" (04/19/2017)  ? HLD (hyperlipidemia)   ? HTN (hypertension)   ? Hyperlipidemia 03/15/2011  ? Ischemic cardiomyopathy   ? Admitted in 07/2010 with CHF exacerbation  ? Myocardial infarction [facility])   ? "I've had 4" (04/19/2017)  ? Pneumonia 2018 X 1  ? Seizures ([place])   ? one seizure in 04/2016 during cardiac event (04/19/2017)  ? STEMI 2009 (anterior), 2010 (lateral), and 2012 (inferior) 03/14/2011  ? ? ?Assessment: ?80 yoM on warfarin PTA for hx HM3 LVAD. Pt admitted 2/11 with ICH requiring ventriculostomy. INR 2.4 on admit reversed with vitamin K and 4FPCC. Ventriculostomy drain removed 2/25. ? ?Warfarin ordered to resume 2/27 but not given until 3/1, head CT stable.  ?INR 1.8 today -  therapeutic. ? ?*Home warfarin dose 4mg  Fri, 2mg  AODs ? ?Goal of Therapy:  ?INR 1.5-2.0 (given ICH on warfarin within therapeutic range) ?Monitor platelets by anticoagulation protocol: Yes ?  ?Plan:  ?-Warfarin 1.5mg  PO x1 ?-Daily INR ?-CBC every 72h ordered ? ?Laurey Arrow, PharmD ?PGY1 Pharmacy Resident ?07/04/2021  12:40 PM ? ?Please check [email or web address] for unit-specific pharmacy phone numbers. ? ?

## 2021-07-04 NOTE — Progress Notes (Signed)
?                                                       PROGRESS NOTE ? ? ?Subjective/Complaints: ? ?No issues overnight , appreciate cardiology notes , No HA ? ?ROS- neg CP, SOB, N/V/D ? ?Objective: ?  ?No results found. ?Recent Labs  ?  07/03/21 ?6010  ?WBC 7.3  ?HGB 12.6*  ?HCT 39.7  ?PLT 184  ? ? ?Recent Labs  ?  07/02/21 ?0712 07/03/21 ?9323  ?NA 137 135  ?K 4.6 4.2  ?CL 107 107  ?CO2 23 20*  ?GLUCOSE 87 98  ?BUN 9 10  ?CREATININE 1.63* 1.54*  ?CALCIUM 8.9 8.8*  ? ? ? ?Intake/Output Summary (Last 24 hours) at 07/04/2021 0811 ?Last data filed at 07/03/2021 1900 ?Gross per 24 hour  ?Intake 200 ml  ?Output --  ?Net 200 ml  ? ?  ? ?  ? ?Physical Exam: ?Vital Signs ?Blood pressure 102/74, pulse 80, temperature 97.9 ?F (36.6 ?C), temperature source Oral, resp. rate 14, height 5\' 5"  (1.651 m), weight 77.4 kg, SpO2 98 %. ? ? ?General: No acute distress ?Mood and affect are appropriate ?Heart: + LVAD hum ?Lungs: Clear to auscultation, breathing unlabored, no rales or wheezes ?Abdomen: Positive bowel sounds, soft nontender to palpation, nondistended ?Extremities: No clubbing, cyanosis, or edema ?Skin: No evidence of breakdown, no evidence of rash, RIght frontal EVD scalp wound healing well  ?Neurologic: Cranial nerves II through XII intact, motor strength is 5/5 in bilateral deltoid, bicep, tricep, grip, hip flexor, knee extensors, ankle dorsiflexor and plantar flexor ? ?Musculoskeletal: Full range of motion in all 4 extremities. No joint swelling ? ? ?Assessment/Plan: ?1. Functional deficits which require 3+ hours per day of interdisciplinary therapy in a comprehensive inpatient rehab setting. ?Physiatrist is providing close team supervision and 24 hour management of active medical problems listed below. ?Physiatrist and rehab team continue to assess barriers to discharge/monitor patient progress toward functional and medical goals ? ?Care Tool: ? ?Bathing ?   ?Body parts bathed by patient: Right arm, Left arm, Chest,  Abdomen, Front perineal area, Buttocks, Right upper leg, Left upper leg, Right lower leg, Left lower leg, Face  ?   ?  ?  ?Bathing assist Assist Level: Minimal Assistance - Patient > 75% (standing- per pt does this at home) ?  ?  ?Upper Body Dressing/Undressing ?Upper body dressing   ?What is the patient wearing?: Pull over shirt ?   ?Upper body assist Assist Level: Independent ?   ?Lower Body Dressing/Undressing ?Lower body dressing ? ? ?   ?What is the patient wearing?: Pants, Underwear/pull up ? ?  ? ?Lower body assist Assist for lower body dressing: Supervision/Verbal cueing ?   ? ?Toileting ?Toileting    ?Toileting assist Assist for toileting: Independent ?  ?  ?Transfers ?Chair/bed transfer ? ?Transfers assist ?   ? ?Chair/bed transfer assist level: Supervision/Verbal cueing ?  ?  ?Locomotion ?Ambulation ? ? ?Ambulation assist ? ?   ? ?Assist level: Supervision/Verbal cueing ?Assistive device: No Device ?Max distance: >300'  ? ?Walk 10 feet activity ? ? ?Assist ?   ? ?Assist level: Supervision/Verbal cueing ?Assistive device: No Device  ? ?Walk 50 feet activity ? ? ?Assist   ? ?Assist level: Supervision/Verbal cueing ?Assistive device: No Device  ? ? ?  Walk 150 feet activity ? ? ?Assist   ? ?Assist level: Supervision/Verbal cueing ?Assistive device: No Device ?  ? ?Walk 10 feet on uneven surface  ?activity ? ? ?Assist   ? ? ?Assist level: Contact Guard/Touching assist ?Assistive device: Other (comment) (no device)  ? ?Wheelchair ? ? ? ? ?Assist Is the patient using a wheelchair?: No ?  ?  ? ?  ?   ? ? ?Wheelchair 50 feet with 2 turns activity ? ? ? ?Assist ? ?  ?  ? ? ?   ? ?Wheelchair 150 feet activity  ? ? ? ?Assist ?   ? ? ?   ? ?Blood pressure 102/74, pulse 80, temperature 97.9 ?F (36.6 ?C), temperature source Oral, resp. rate 14, height 5\' 5"  (1.651 m), weight 77.4 kg, SpO2 98 %. ? ?Medical Problem List and Plan: ?1. 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            -patient may  shower ?            -ELOS/Goals: S 07/06/21 ?             ?2.  Antithrombotics: ?-DVT/anticoagulation:  Pharmaceutical: Coumadin resumed 03/01.  ?            -antiplatelet therapy: N/A--off ASA due to AVM/GIB ?3. Headaches/Pain Management:  HA since bleed ?            --oxycodone effective and being tapered to bid with ultram additionally prn. Increase  topamax 50mg  BID ?            --gabapentin for neuropathic chest pain will d/c given trial of topirimate ?            --back pain since injury a few months ago  ?4. Mood: LCSW to follow for evaluation and support.  ?            -antipsychotic agents: N/A ?5. Neuropsych: This patient is capable of making decisions on her own behalf. ?6. Skin/Wound Care: scalp incision CDI healing well ?7. Fluids/Electrolytes/Nutrition: Strict I/O. ?-daily wts. Monitor for signs of overload.  ?8. CAD/ICM s/p LVAD/ICD: LVAD checks daily per protocol ?            --continue Cozaar, Lipitor and amiodarone. - as per CHF team  ?Vitals:  ? 07/04/21 0432 07/04/21 0802  ?BP: 124/89 102/74  ?Pulse: 80   ?Resp: 14   ?Temp: 97.9 ?F (36.6 ?C)   ?SpO2: 98%   ? ? ?9. H/o A fib: Monitor HR TID--continue amiodarone daily. Rate controlled  ?10. Urinary retention: Continue Flomax  ?--+BM 03/05-->d/c foley and monitor voiding function  ?11. Constipation: On Senna and miralax--augment as needed ?  ? ?LOS: ?6 days ?A FACE TO FACE EVALUATION WAS PERFORMED ? ?Luanna Salk Fard [place] ?07/04/2021, 8:11 AM  ? ? ? ?

## 2021-07-05 ENCOUNTER — Encounter (HOSPITAL_COMMUNITY): Payer: Medicare HMO

## 2021-07-05 LAB — BASIC METABOLIC PANEL
Anion gap: 7 (ref 5–15)
BUN: 10 mg/dL (ref 6–20)
CO2: 24 mmol/L (ref 22–32)
Calcium: 9.1 mg/dL (ref 8.9–10.3)
Chloride: 109 mmol/L (ref 98–111)
Creatinine, Ser: 1.71 mg/dL — ABNORMAL HIGH (ref 0.61–1.24)
GFR, Estimated: 46 mL/min — ABNORMAL LOW (ref >60)
Glucose, Bld: 90 mg/dL (ref 70–99)
Potassium: 4.3 mmol/L (ref 3.5–5.1)
Sodium: 140 mmol/L (ref 135–145)

## 2021-07-05 LAB — LACTATE DEHYDROGENASE: LDH: 152 U/L (ref 98–192)

## 2021-07-05 LAB — PROTIME-INR
INR: 2 — ABNORMAL HIGH (ref 0.8–1.2)
Prothrombin Time: 22.3 seconds — ABNORMAL HIGH (ref 11.4–15.2)

## 2021-07-05 MED ORDER — WARFARIN 0.5 MG HALF TABLET
0.5000 mg | ORAL_TABLET | Freq: Once | ORAL | Status: AC
  Administered 2021-07-05: 0.5 mg via ORAL
  Filled 2021-07-05: qty 1

## 2021-07-05 MED ORDER — SODIUM CHLORIDE 0.9 % IV SOLN
INTRAVENOUS | Status: AC
Start: 2021-07-05 — End: 2021-07-05

## 2021-07-05 NOTE — Progress Notes (Signed)
ANTICOAGULATION CONSULT NOTE ? ?Pharmacy Consult for warfarin ?Indication:  LVAD ? ?Allergies  ?Allergen Reactions  ? Plavix [Clopidogrel Bisulfate] Hives  ? ? ?Patient Measurements: ?Height: 5\' 5"  (165.1 cm) ?Weight: 77.4 kg (170 lb 10.2 oz) ?IBW/kg (Calculated) : 61.5 ? ?Vital Signs: ?Temp: 98 ?F (36.7 ?C) (03/13 7035) ?Temp Source: Oral (03/12 2218) ?BP: 97/81 (03/13 0093) ?Pulse Rate: 80 (03/13 0926) ? ?Labs: ?Recent Labs  ?  07/03/21 ?8182 07/04/21 ?0734 07/05/21 ?0552  ?HGB 12.6*  --   --   ?HCT 39.7  --   --   ?PLT 184  --   --   ?LABPROT 20.7* 21.3* 22.3*  ?INR 1.8* 1.8* 2.0*  ?CREATININE 1.54* 1.45* 1.71*  ? ? ? ?Estimated Creatinine Clearance: 45.8 mL/min (A) (by C-G formula based on SCr of 1.71 mg/dL (H)). ? ? ?Medical History: ?Past Medical History:  ?Diagnosis Date  ? AICD (automatic cardioverter/defibrillator) present   ? Anemia   ? Anxiety   ? CAD (coronary artery disease) 2009  ? AMI in 12/2007 with PCI to LAD, staged PCI to  mid/distal RCA, NSTEMI in 02/2009 with BMS to LCx  ? CHF (congestive heart failure) ([place])   ? Chronic kidney disease 11/03/2016  ? stage 3 kidney disease  ? Dysrhythmia   ? "arrythmia problems at some point", "fatal rhythms"  ? History of blood transfusion   ? "I was bleeding from was rectum" (04/19/2017)  ? HLD (hyperlipidemia)   ? HTN (hypertension)   ? Hyperlipidemia 03/15/2011  ? Ischemic cardiomyopathy   ? Admitted in 07/2010 with CHF exacerbation  ? Myocardial infarction [facility])   ? "I've had 4" (04/19/2017)  ? Pneumonia 2018 X 1  ? Seizures ([place])   ? one seizure in 04/2016 during cardiac event (04/19/2017)  ? STEMI 2009 (anterior), 2010 (lateral), and 2012 (inferior) 03/14/2011  ? ? ?Assessment: ?39 yoM on warfarin PTA for hx HM3 LVAD. Pt admitted 2/11 with ICH requiring ventriculostomy. INR 2.4 on admit reversed with vitamin K and 4FPCC. Ventriculostomy drain removed 2/25. ? ?Warfarin ordered to resume 2/27 but not given until 3/1, head CT stable. INR today is therapeutic at  2 (on upper end of goal range). No s/sx of bleeding. LDH stable at 152. Only 25% of oral intake documented.  ? ?*Home warfarin dose 4mg  Fri, 2mg  AODs ? ?Goal of Therapy:  ?INR 1.5-2.0 (given ICH on warfarin within therapeutic range) ?Monitor platelets by anticoagulation protocol: Yes ?  ?Plan:  ?-Warfarin 0.5mg  PO x1 ?-Daily INR ?-CBC every 72h ordered ? ?Antonietta Jewel, PharmD, BCCCP ?Clinical Pharmacist  ?Phone: [phone] ?07/05/2021 9:35 AM ? ?Please check AMION for all [place] phone numbers ?After 10:00 PM, call [place] [phone] ? ? ?

## 2021-07-05 NOTE — Progress Notes (Incomplete)
Physical Therapy Discharge Summary  Patient Details  Name: TZVI ECONOMOU MRN: [id number] Date of Birth: [birth year]  Today's Date: 07/05/2021 PT Individual Time: 0805-0902 PT Individual Time Calculation (min): 57 min    Patient has met {NUMBERS 0-12:18577} of {NUMBERS 0-12:18577} long term goals due to {due QW:0379444}.  Patient to discharge at [facility] level {LOA:3049010}.   Patient's care partner {care partner:3041650} to provide the necessary {assistance:3041652} assistance at discharge.  Reasons goals not met: ***  Recommendation:  Patient will benefit from ongoing skilled PT services in {setting:3041680} to continue to advance safe functional mobility, address ongoing impairments in ***, and minimize fall risk.  Equipment: {equipment:3041657}  Reasons for discharge: {Reason for discharge:3049018}  Patient/family agrees with progress made and goals achieved: {Pt/Family agree with progress/goals:3049020}  PT Discharge Precautions/Restrictions   Vital Signs Therapy Vitals Temp: 98 F (36.7 C) Pulse Rate: 64 Resp: 17 BP: 97/81 Patient Position (if appropriate): Lying Oxygen Therapy SpO2: 96 % O2 Device: Room Air Pain Pain Assessment Pain Scale: 0-10 Pain Score: 4  Pain Descriptors / Indicators: Headache Pain Intervention(s): Refused Pain Interference   Vision/Perception     Cognition Orientation Level: Oriented X4 Sensation   Motor     Mobility   Locomotion     Trunk/Postural Assessment     Balance   Extremity Assessment             Alger Simons 07/05/2021, 11:33 AM

## 2021-07-05 NOTE — Progress Notes (Signed)
0200 ?Patient monitor closely and w/o distress or additional agitation episodes, during shift rounding and prn ? ?0600 ?Continue to monitor, no c/o positive LVAD humming,monitoring continue, Resting quietly and arousable ?

## 2021-07-05 NOTE — Progress Notes (Signed)
LVAD Coordinator Rounding Note: ? ?Admitted 06/05/21 due to [place] with hydrocephalus to Dr Bensimhon's service. Ventriculostomy placed at bedside by Dr Arnoldo Morale.  ? ?HM III LVAD implanted on 04/20/17 by Dr Cyndia Bent under destination therapy criteria. ? ?2/11 Presented with ICH ?2/11 Underwent bedside ventriculostomy wit NSU ?2/12 Repeat head CT with resolution of hydrocephalus. Persistent hematoma ?2/17 Repeat CT head with increased hemorrhage in right lateral ventricle, slightly increased ventricular dilation.  The ventriculostomy drain is occluded.  ?2/18 Ventriculostomy drain replaced ?2/25 CT improved ventriculomegaly. Drain pulled ?2/27 F/u CT w/ stable intraparenchymal hemorrhage. Unchanged from prior.    ?3/4 Head CT no change from prior ?3/6 d/c to CIR ? ?Pt laying in bed upon my arrival. States he [place] feeling fine. Continues with 4/10 headache.  ? ?1 LOW FLOW noted on interrogation from this morning, with increased number of PI events noted. Pt denies hearing any VAD alarms. Alarm possibly due to inadequate PO intake as pt requires verbal cues to remind him to drink enough throughout the day. Nothing off his breakfast tray has been eaten or drank. Placed bedside tray table beside pt with full cup of water and encouraged to drink. Discussed with Marlyce Huge PA- will give 250 cc NS. VAD numbers stable.  ? ?Spoke with pt's wife [name] on the phone while I was in pt's room, and updated on plan for today.  ? ?Vital signs: ?Temp: 98.0 ?HR: 80 ?Doppler Pressure: 82 ?Automatic BP: 97/81 (88) ?O2 Sat: 96% on RA ?Wt:177.4>176.3>175.9>170.6 lbs   ? ?LVAD interrogation reveals:  ?Speed: 5400 ?Flow: 4.4 ?Power:  4.1w ?PI: 5.2 ?Hematocrit: 38 ? ?Alarms: 1 asymptomatic LOW FLOW at 0439 this morning ?Events: 15 PI events so far today ? ?Fixed speed: 5400 ?Low speed limit: 5100 ? ?Drive Line: Existing VAD dressing removed and site care performed using sterile technique. Drive line exit site cleaned with Chlora prep applicators  x 2, allowed to dry, and rinsed with saline. Sorbaview dressing with Silverlon patch applied. Exit site healed and incorporated, the velour is fully implanted at exit site. Large scab noted at exit site- fell off with cleansing. No redness, tenderness, drainage, foul odor or rash noted. Drive line anchor re-applied. Pt denies fever or chills. Weekly dressing changes per VAD coordinator or pt's wife. Next dressing change due: 07/12/21. ? ?Labs:  ?LDH trend: 172>194>166>172>152 ? ?INR trend: 1.5>1.7>1.8>2.0 ? ? ?Anticoagulation Plan: ?-INR Goal: 1.5-2   ?-ASA Dose: none due to hx of GI bleed ? ?Device: ?- [employer]tronic BiV ?-Therapies: on- VF 200, VT 167 ? AT 171 monitored ?- Last check: 04/29/21 ? ?Adverse Events on VAD: ?- Readmitted 2/19 hgb 3.8. INR 2.9.transfused 4u pRBCs. EGD - normal esophagus, 4 nonbleeding gastric AVMs treated with APC, normal duodenum and jejunum ?- Underwent DC-CV for AFL on 10/17/18  ?- Admitted 06/05/21 for [place] with ventriculostomy placement.  ? ?Plan/Recommendations:  ?1. Call VAD coordinator for any VAD equipment or drive line issues ?2. Weekly drive line dressing change per VAD coordinator or pt's wife. Next dressing change due 07/12/21. ? ? ?Emerson Monte RN ?VAD Coordinator  ?Office: [phone]  ?24/7 Pager: [phone]  ? ? ? ?  ?

## 2021-07-05 NOTE — Progress Notes (Signed)
Occupational Therapy Session Note ? ?Patient Details  ?Name: David [name] ?MRN: [id number] ?Date of Birth: May 08, 1965 ? ?Today's Date: 07/05/2021 ?OT Individual Time: 0301-3143 ?OT Individual Time Calculation (min): 60 min  ? ? ?Short Term Goals: ?Week 1:  OT Short Term Goal 1 (Week 1): STG=LTG d/t ELOS ? ?Skilled Therapeutic Interventions/Progress Updates:  ?Pt greeted supine in bed   agreeable to OT intervention. Session focus on BADL reeducation, functional mobility, dynamic standing balance, [place], LUE coordination and decreasing overall caregiver burden. Pt completed supine>sit with supervision. Pt donned shoes from EOB with set- up assist, other dressing needs had been met. Pt needed MIN A to switch LVAD wires from in room machine to portable as pt screwing connector the opposite direction. Pt reports his wife assists with task at home. Pt sit>stand from EOB with supervision no AD. Pt ambulated to gym with no AD and supervision while carrying extra battery bags. Pt completed 9 hole pegs test with results as listed below:     ?LUE: 57 secs ?RUE: 40 secs ? ?Pt ambulated to other gym with no AD and supervision. Pt completed trail making task to work on LUE coordination, L inattention, dynamic balance and attention to task. Pt completed task in 3 mins with 5 interruptions and 7 errors, pt with difficulty keep index finger isolated and have difficult precisely touching each number on the screen. Pt ambulated back to room with supervision, where pt completed ambulatory toilet transfer with supervision, with pt completing 3/3 toileting tasks with supervision. Pt ambulated to sink for standing ADLs with supervision. Pt left seated EOB with RN present assisting pt with switching over from portable back to in room monitor.                               ?Therapy Documentation ?Precautions:  ?Precautions ?Precautions: Fall, Other (comment) ?Precaution Comments: LVAD (x4 year hx), L inattention ?Restrictions ?Weight Bearing  Restrictions: No ? ? Pain: no pain reported during session  ? ? ? ?Therapy/Group: Individual Therapy ? ?[name] ?07/05/2021, 3:58 PM ?

## 2021-07-05 NOTE — Progress Notes (Signed)
Speech Language Pathology Daily Session Note ? ?Patient Details  ?Name: David [name] ?MRN: [id number] ?Date of Birth: [birth year] ? ?Today's Date: 07/05/2021 ?SLP Individual Time: 1050-1130 ?SLP Individual Time Calculation (min): 40 min ? ?Short Term Goals: ?Week 1: SLP Short Term Goal 1 (Week 1): STG=LTG due to ELOS ? ?Skilled Therapeutic Interventions: Skilled treatment session focused on cognitive goals. Upon arrival, patient was awake in bed and agreeable to treatment session. Patient's breakfast tray was in the room and patient reported he was unable to eat due to morning OT session. Patient requested to eat his breakfast and required extra time for tray set-up. Patient attempted to divide his attention between self-feeding and participating in a cognitive assessment, but was unable to. Therefore, patient had to alternate attention between the two with overall Mod A verbal cues. SLP re-administered the [facility] Mental Status Examination (SLUMS). Patient scored 16/30 points with a score of 27 or above considered normal. Patient continues to demonstrate deficits in attention, memory, and problem solving. Patient left upright in bed with alarm on and all needs within reach. Continue with current plan of care.  ? ?   ? ?Pain ?No/Denies Pain  ? ?Therapy/Group: Individual Therapy ? ?Delara Shepheard ?07/05/2021, 12:35 PM ?

## 2021-07-05 NOTE — Discharge Instructions (Signed)
Inpatient Rehab Discharge Instructions  David [name] Discharge date and time: 07/06/21   Activities/Precautions/ Functional Status: Activity: activity as tolerated and no driving for today Diet: cardiac diet Wound Care: keep wound clean and dry   Functional status:  ___ No restrictions     ___ Walk up steps independently _X__ 24/7 supervision/assistance   ___ Walk up steps with assistance ___ Intermittent supervision/assistance  ___ Bathe/dress independently ___ Walk with walker     _X__ Bathe/dress with assistance ___ Walk Independently    ___ Shower independently ___ Walk with assistance    ___ Shower with assistance _X__ No alcohol     ___ Return to work/school ________   Special Instructions: Family to assist with medication management.   Limit oxycodone use to as needed for severe pain.    My questions have been answered and I understand these instructions. I will adhere to these goals and the provided educational materials after my discharge from the hospital.  Patient/Caregiver Signature _______________________________ Date __________  Clinician Signature _______________________________________ Date __________  Please bring this form and your medication list with you to all your follow-up doctor's appointments.

## 2021-07-05 NOTE — Progress Notes (Shared)
Occupational Therapy Discharge Summary  Patient Details  Name: David [name] MRN: [id number] Date of Birth: [birth year]  Today's Date: 07/05/2021 OT Individual Time: 7591-6384 OT Individual Time Calculation (min): 60 min    Patient has met {NUMBERS 0-12:18577} of {NUMBERS 0-12:18577} long term goals due to {due to:3041651}.  Patient to discharge at overall {LOA:3049010} level.  Patient's care partner {care partner:3041650} to provide the necessary {assistance:3041652} assistance at discharge.    Reasons goals not met: ***  Recommendation:  Patient will benefit from ongoing skilled OT services in {setting:3041680} to continue to advance functional skills in the area of {ADL/iADL:3041649}.  Equipment: {equipment:3041657}  Reasons for discharge: {Reason for discharge:3049018}  Patient/family agrees with progress made and goals achieved: {Pt/Family agree with progress/goals:3049020}  OT Discharge Precautions/Restrictions  Precautions Precaution Comments: LVAD (x4 year hx), L inattention Restrictions Weight Bearing Restrictions: No General  Vital Signs Therapy Vitals Temp: 97.7 F (36.5 C) Temp Source: Oral Pulse Rate: 81 Resp: 15 BP: 122/77 Patient Position (if appropriate): Lying Oxygen Therapy SpO2: 97 % O2 Device: Room Air Pain Pain Assessment Pain Scale: 0-10 Pain Score: 8  (At IV site, pain is 0 everywhere else) ADL ADL Grooming: Contact guard Where Assessed-Grooming: Standing at sink Upper Body Bathing: Minimal assistance Where Assessed-Upper Body Bathing: Standing at sink Lower Body Bathing: Minimal assistance Where Assessed-Lower Body Bathing: Standing at sink Upper Body Dressing: Minimal assistance Where Assessed-Upper Body Dressing: Standing at sink Lower Body Dressing: Minimal assistance Where Assessed-Lower Body Dressing: Sitting at sink, Standing at sink Toileting: Minimal assistance Where Assessed-Toileting: Glass blower/designer: Microbiologist Method: Counselling psychologist: Energy manager Method: Unable to assess (LVAD- sponge bathing only) Vision Baseline Vision/History: 0 No visual deficits Patient Visual Report: No change from baseline Perception  Perception: Not tested Inattention/Neglect: Does not attend to left visual field;Does not attend to left side of body Praxis Praxis: Impaired Cognition Overall Cognitive Status: Impaired/Different from baseline Arousal/Alertness: Awake/alert Orientation Level: Oriented X4 (slow to respond) Year: 2023 Month: March Day of Week: Incorrect Memory: Impaired Memory Impairment: Decreased recall of new information;Retrieval deficit Immediate Memory Recall: Sock;Blue;Bed Memory Recall Sock: Without Cue Memory Recall Blue: With Cue Memory Recall Bed: Without Cue Awareness: Impaired Awareness Impairment: Intellectual impairment Sensation Sensation Light Touch: Appears Intact Hot/Cold: Appears Intact Proprioception: Appears Intact Stereognosis: Not tested Coordination Gross Motor Movements are Fluid and Coordinated: Yes 9 Hole Peg Test: LUE 57 secs RUE 40 secs Motor    Mobility     Trunk/Postural Assessment     Balance   Extremity/Trunk Assessment RUE Assessment RUE Assessment: Within Functional Limits General Strength Comments: 4/5 grossly LUE Assessment LUE Assessment: Exceptions to [facility] General Strength Comments: slightly decreased strength and coordination with LUE; 3+/5   [name] 07/05/2021, 4:12 PM

## 2021-07-05 NOTE — Progress Notes (Signed)
?                                                       PROGRESS NOTE ? ? ?Subjective/Complaints: ? ? ?No issues overnite , HA generally better discussed topamax dose increase, adequate pain relief at present ? ?ROS- neg CP, SOB, N/V/D ? ?Objective: ?  ?No results found. ?Recent Labs  ?  07/03/21 ?8099  ?WBC 7.3  ?HGB 12.6*  ?HCT 39.7  ?PLT 184  ? ? ?Recent Labs  ?  07/04/21 ?0734 07/05/21 ?0552  ?NA 136 140  ?K 4.1 4.3  ?CL 110 109  ?CO2 18* 24  ?GLUCOSE 87 90  ?BUN 9 10  ?CREATININE 1.45* 1.71*  ?CALCIUM 8.8* 9.1  ? ? ? ?Intake/Output Summary (Last 24 hours) at 07/05/2021 0804 ?Last data filed at 07/04/2021 8338 ?Gross per 24 hour  ?Intake 360 ml  ?Output --  ?Net 360 ml  ? ?  ? ?  ? ?Physical Exam: ?Vital Signs ?Blood pressure 100/70, pulse 78, temperature 98.4 ?F (36.9 ?C), temperature source Oral, resp. rate 16, height 5\' 5"  (1.651 m), weight 77.4 kg, SpO2 96 %. ? ? ?General: No acute distress ?Mood and affect are appropriate ?Heart: Regular rate and rhythm no rubs murmurs or extra sounds ?Lungs: Clear to auscultation, breathing unlabored, no rales or wheezes ?Abdomen: Positive bowel sounds, soft nontender to palpation, nondistended ?Extremities: No clubbing, cyanosis, or edema ? ?Skin: No evidence of breakdown, no evidence of rash, RIght frontal EVD scalp wound healing well  ?Neurologic: Cranial nerves II through XII intact, motor strength is 5/5 in bilateral deltoid, bicep, tricep, grip, hip flexor, knee extensors, ankle dorsiflexor and plantar flexor ? ?Musculoskeletal: Full range of motion in all 4 extremities. No joint swelling ? ? ?Assessment/Plan: ?1. Functional deficits which require 3+ hours per day of interdisciplinary therapy in a comprehensive inpatient rehab setting. ?Physiatrist is providing close team supervision and 24 hour management of active medical problems listed below. ?Physiatrist and rehab team continue to assess barriers to discharge/monitor patient progress toward functional and medical  goals ? ?Care Tool: ? ?Bathing ?   ?Body parts bathed by patient: Right arm, Left arm, Chest, Abdomen, Front perineal area, Buttocks, Right upper leg, Left upper leg, Right lower leg, Left lower leg, Face  ?   ?  ?  ?Bathing assist Assist Level: Minimal Assistance - Patient > 75% (standing- per pt does this at home) ?  ?  ?Upper Body Dressing/Undressing ?Upper body dressing   ?What is the patient wearing?: Pull over shirt ?   ?Upper body assist Assist Level: Independent ?   ?Lower Body Dressing/Undressing ?Lower body dressing ? ? ?   ?What is the patient wearing?: Pants, Underwear/pull up ? ?  ? ?Lower body assist Assist for lower body dressing: Supervision/Verbal cueing ?   ? ?Toileting ?Toileting    ?Toileting assist Assist for toileting: Supervision/Verbal cueing ?  ?  ?Transfers ?Chair/bed transfer ? ?Transfers assist ?   ? ?Chair/bed transfer assist level: Supervision/Verbal cueing ?  ?  ?Locomotion ?Ambulation ? ? ?Ambulation assist ? ?   ? ?Assist level: Supervision/Verbal cueing ?Assistive device: No Device ?Max distance: >300'  ? ?Walk 10 feet activity ? ? ?Assist ?   ? ?Assist level: Supervision/Verbal cueing ?Assistive device: No Device  ? ?Walk 50 feet activity ? ? ?  Assist   ? ?Assist level: Supervision/Verbal cueing ?Assistive device: No Device  ? ? ?Walk 150 feet activity ? ? ?Assist   ? ?Assist level: Supervision/Verbal cueing ?Assistive device: No Device ?  ? ?Walk 10 feet on uneven surface  ?activity ? ? ?Assist   ? ? ?Assist level: Contact Guard/Touching assist ?Assistive device: Other (comment) (no device)  ? ?Wheelchair ? ? ? ? ?Assist Is the patient using a wheelchair?: No ?  ?  ? ?  ?   ? ? ?Wheelchair 50 feet with 2 turns activity ? ? ? ?Assist ? ?  ?  ? ? ?   ? ?Wheelchair 150 feet activity  ? ? ? ?Assist ?   ? ? ?   ? ?Blood pressure 100/70, pulse 78, temperature 98.4 ?F (36.9 ?C), temperature source Oral, resp. rate 16, height 5\' 5"  (1.651 m), weight 77.4 kg, SpO2 96 %. ? ?Medical Problem  List and Plan: ?1. 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            -patient may shower ?            -ELOS/Goals: S 07/06/21 ?             ?2.  Antithrombotics: ?-DVT/anticoagulation:  Pharmaceutical: Coumadin resumed 03/01.  ?            -antiplatelet therapy: N/A--off ASA due to AVM/GIB ?3. Headaches/Pain Management:  HA since bleed ?            --oxycodone effective and being tapered to bid with ultram additionally prn. Improved , cont  topamax 50mg  BID ?            --gabapentin for neuropathic chest pain will d/c given trial of topirimate ?            --back pain since injury a few months ago  ?4. Mood: LCSW to follow for evaluation and support.  ?            -antipsychotic agents: N/A ?5. Neuropsych: This patient is capable of making decisions on her own behalf. ?6. Skin/Wound Care: scalp incision CDI healing well ?7. Fluids/Electrolytes/Nutrition: Strict I/O. ?-daily wts. Monitor for signs of overload.  ?8. CAD/ICM s/p LVAD/ICD: LVAD checks daily per protocol ?            --continue Cozaar, Lipitor and amiodarone. - as per CHF team  ?Vitals:  ? 07/04/21 2218 07/05/21 0500  ?BP: 100/70   ?Pulse: 78   ?Resp:    ?Temp: 98.4 ?F (36.9 ?C)   ?SpO2: 96% 96%  ? ? ?9. H/o A fib: Monitor HR TID--continue amiodarone daily. Rate controlled  ?10. Urinary retention: Continue Flomax  ?--+BM 03/05-->d/c foley and monitor voiding function  ?11. Constipation: On Senna and miralax--augment as needed ?  ? ?LOS: ?7 days ?A FACE TO FACE EVALUATION WAS PERFORMED ? ?Luanna Salk Aydrien Froman ?07/05/2021, 8:04 AM  ? ? ? ?

## 2021-07-05 NOTE — Progress Notes (Addendum)
Advanced Heart Failure VAD Team Note  PCP-Cardiologist: Glori Bickers, MD   Subjective:    2/11 Presented with [facility] 2/11 Underwent bedside ventriculostomy wit NSU 2/12 Repeat head CT with resolution of hydrocephalus. Persistent hematoma 2/17 Repeat CT head with increased hemorrhage in right lateral ventricle, slightly increased ventricular dilation.  The ventriculostomy drain is occluded.  2/18 Ventriculostomy drain replaced 2/25 CT improved ventriculomegaly. Drain pulled 2/27 F/u CT w/ stable intraparenchymal hemorrhage. Unchanged from prior.    3/1 Coumadin restarted  3/4 Confusion. Head CT no change from prior 3/6 Discharged to CIR   Feeling well. Continues with headache but improving. Denies dyspnea. Working with PT/OT.  ? Adequate PO intake.  LVAD INTERROGATION:  HeartMate III LVAD:   Flow 4.5 liters/min, speed 5400, power 4.1  PI 5. 13 PI events. 1 low flow alarm this am. VAD interrogated personally. Parameters stable.  Objective:    Vital Signs:   Temp:  [98 F (36.7 C)-98.6 F (37 C)] 98 F (36.7 C) (03/13 0926) Pulse Rate:  [78-88] 80 (03/13 0926) Resp:  [16-18] 16 (03/13 0926) BP: (97-104)/(70-81) 97/81 (03/13 0926) SpO2:  [96 %-98 %] 96 % (03/13 0926) Last BM Date : 07/02/21 (per patient) Mean arterial Pressure 80s  Intake/Output:   Intake/Output Summary (Last 24 hours) at 07/05/2021 1023 Last data filed at 07/04/2021 1822 Gross per 24 hour  Intake 360 ml  Output --  Net 360 ml     Physical Exam   General:  NAD.  HEENT: normal  Neck: supple. JVP not elevated.  Carotids 2+ bilat; no bruits. No lymphadenopathy or thryomegaly appreciated. Cor: LVAD hum.  Lungs: Clear. Abdomen: soft, nontender, non-distended. No hepatosplenomegaly. No bruits or masses. Good bowel sounds. Driveline site clean. Anchor in place.  Extremities: no cyanosis, clubbing, rash. Warm no edema  Neuro: alert & oriented x 3. No focal deficits. Moves all 4 without problem     Labs   Basic Metabolic Panel: Recent Labs  Lab 06/29/21 0537 06/30/21 3762 06/30/21 8315 07/01/21 0559 07/02/21 0712 07/03/21 0046 07/04/21 0734 07/05/21 0552  NA  --  137   < > 136 137 135 136 140  K  --  4.4   < > 3.8 4.6 4.2 4.1 4.3  CL  --  106   < > 104 107 107 110 109  CO2  --  23   < > 24 23 20* 18* 24  GLUCOSE  --  83   < > 82 87 98 87 90  BUN  --  7   < > 9 9 10 9 10   CREATININE  --  1.45*   < > 1.44* 1.63* 1.54* 1.45* 1.71*  CALCIUM  --  8.6*   < > 8.6* 8.9 8.8* 8.8* 9.1  MG 1.6* 1.9  --   --   --   --   --   --    < > = values in this interval not displayed.    Liver Function Tests: No results for input(s): AST, ALT, ALKPHOS, BILITOT, PROT, ALBUMIN in the last 168 hours. No results for input(s): LIPASE, AMYLASE in the last 168 hours. No results for input(s): AMMONIA in the last 168 hours.  CBC: Recent Labs  Lab 07/03/21 0046  WBC 7.3  HGB 12.6*  HCT 39.7  MCV 84.1  PLT 184    INR: Recent Labs  Lab 07/01/21 0559 07/02/21 0712 07/03/21 0046 07/04/21 0734 07/05/21 0552  INR 1.7* 1.8* 1.8* 1.8* 2.0*  Other results:   Imaging   No results found.   Medications:     Scheduled Medications:  amiodarone  200 mg Oral Daily   atorvastatin  40 mg Oral Daily   Chlorhexidine Gluconate Cloth  6 each Topical Daily   feeding supplement  1 Container Oral TID BM   losartan  25 mg Oral Daily   magnesium oxide  400 mg Oral BID   mouth rinse  15 mL Mouth Rinse BID   multivitamin with minerals  1 tablet Oral Daily   pantoprazole  40 mg Oral Daily   polyethylene glycol  17 g Oral Daily   senna-docusate  2 tablet Oral QPC supper   tamsulosin  0.4 mg Oral Daily   topiramate  50 mg Oral BID   traZODone  50 mg Oral QHS   Warfarin - Pharmacist Dosing Inpatient   Does not apply q1600    Infusions:   PRN Medications: acetaminophen, alum & mag hydroxide-simeth, bisacodyl, diphenhydrAMINE, guaiFENesin-dextromethorphan, hydrALAZINE,  ipratropium-albuterol, lidocaine, oxyCODONE, polyethylene glycol, polyvinyl alcohol, prochlorperazine **OR** prochlorperazine **OR** prochlorperazine, sodium phosphate, sorbitol, traMADol, traZODone    Assessment/Plan:    1. Intracerebral hemorrhage - CT brain with intracerebral and intraventricular hemorrhage with obstructive hydrocephalus. INR 2.4 - AC reversed with Kcentra/vit k in ER - Underwent bedside ventriculostomy on 2/11 - Initial ventriculostomy drain occluded. CT head 2/17 with increased hemorrhage in right lateral ventricle, slightly increased ventricular dilation.  On 2/18, ventriculostomy drain replaced.  - CT 2/25 improved ventriculomegaly. Drain pulled 2/25 - CT 2/27 stable intraparenchymal hemorrhage in the frontal lobe and basal ganglia on the right. Midline shift is slightly reduced from the prior exam. - Increased confusion on 03/04. CT with stable intraparenchymal hemorrhage.  - Stable. Continue with mild HA.  - On coumadin. INR 2.0   2. Chronic systolic CHF with HM III LVAD implant 04/20/17:  - Has done well with VAD support. - He has been seen at [facility] for transplant referral but has decided not to pursue transplant. No change - New oxygen requirement with pulmonary edema on 2/17 CXR => good diuresis on 2/18.  -  2/27 and 2/28 Coumadin ordered not but given. Started 03/01. INR 1.8 today. Discussed dosing with PharmD personally. -  Worry about low volume. Scr up to 1.7 today. Had 1 low flow alarm and several PI events. Recommended he increase fluid intake. Give 250 cc fluids. - Continue current dose of losartan. MAPs 70s-80s. LDH ok   3. Paroxysmal AFL with RVR - had DC-CV on 10/17/18 with subsequent short episodes of AF after but has been mostly in NSR on amio - Had recurrent AF in 10/22.  - not on the monitor. Rate controlled.  - Continue amio 200 mg daily - Continue coumadin.    4. h/o UGI AVM bleed - s/p APC x 4 lesions in 2/19 - No recurrent bleeding  -  Continue PPI - Off ASA due to GIB    5. AKI on CKD Stage IIIa:  - Creatinine baseline 1.3 - 1.7 - SCr 1.45>1.71 - ? Volume depletion   6. VAD management -  VAD interrogated personally. Parameters stable. - LDH has been stable. 179 today - INR goal 1.5-2.0 Off ASA due to GIB 2/19.  - INR 2.4 reversed due to [place]. INR 2.0 today - Continue coumadin - Maps/LDH stable. MAPs 70s-80s - DL site looks good   6. HTN - goal MAP 70-90 with ICH  - losartan increased 2/14 with resultant hypotension and AKI.  Hydralazine stopped.  - Losartan decreased to 25 mg daily on 02/18 d/t bump in Scr.  - MAP stable. See above   7. CAD:   - s/p PCI RCA/PLOM with DES x2 and DES to mild LAD 1/18 - No s/s angina - Continue statin. Off ASA due to GIB   8. Urinary retention - continue Flomax  - Foley out   9. Hypomagnesemia - Continue PO Mag supp   Rehab staff appreciated.   I reviewed the LVAD parameters from today, and compared the results to the patient's prior recorded data.  No programming changes were made.  The LVAD is functioning within specified parameters.  The patient performs LVAD self-test daily.  LVAD interrogation was negative for any significant power changes, alarms or PI events/speed drops.  LVAD equipment check completed and is in good working order.  Back-up equipment present.   LVAD education done on emergency procedures and precautions and reviewed exit site care.  Length of Stay: 7  FINCH, LINDSAY N, PA-C 07/05/2021, 10:23 AM  VAD Team --- VAD ISSUES ONLY--- Pager [phone] (7am - 7am)  Advanced Heart Failure Team  Pager [phone] (M-F; 7a - 5p)  Please contact [facility] for night-coverage after hours (5p -7a ) and weekends on [email or web address]  Patient seen and examined with the above-signed Advanced Practice Provider and/or Housestaff. I personally reviewed laboratory data, imaging studies and relevant notes. I independently examined the patient and formulated the important  aspects of the plan. I have edited the note to reflect any of my changes or salient points. I have personally discussed the plan with the patient and/or family.   Feels ok. But SCr up and having low flows on VAD. MAPs stable. INR 2.0 Hgb 12.6  No fevers or leukocytosis. No neuro changes  General:  NAD.  HEENT: normal  Neck: supple. JVP not elevated.  Carotids 2+ bilat; no bruits. No lymphadenopathy or thryomegaly appreciated. Cor: LVAD hum.  Lungs: Clear. Abdomen: soft, nontender, non-distended. No hepatosplenomegaly. No bruits or masses. Good bowel sounds. Driveline site clean. Anchor in place.  Extremities: no cyanosis, clubbing, rash. Warm no edema  Neuro: alert & oriented x 3. No focal deficits. Moves all 4 without problem   He appears dry. Agree with IVF. Recheck labs in am.  Warfarin dosing discussed  with PharmD personally.  VAD interrogated personally. Parameters stable.  Glori Bickers, MD  6:16 PM

## 2021-07-06 ENCOUNTER — Telehealth (HOSPITAL_COMMUNITY): Payer: Self-pay | Admitting: *Deleted

## 2021-07-06 DIAGNOSIS — I610 Nontraumatic intracerebral hemorrhage in hemisphere, subcortical: Secondary | ICD-10-CM

## 2021-07-06 LAB — CBC
HCT: 40.1 % (ref 39.0–52.0)
Hemoglobin: 12.8 g/dL — ABNORMAL LOW (ref 13.0–17.0)
MCH: 26.6 pg (ref 26.0–34.0)
MCHC: 31.9 g/dL (ref 30.0–36.0)
MCV: 83.4 fL (ref 80.0–100.0)
Platelets: 173 10*3/uL (ref 150–400)
RBC: 4.81 MIL/uL (ref 4.22–5.81)
RDW: 15 % (ref 11.5–15.5)
WBC: 6.5 10*3/uL (ref 4.0–10.5)
nRBC: 0 % (ref 0.0–0.2)

## 2021-07-06 LAB — BASIC METABOLIC PANEL
Anion gap: 5 (ref 5–15)
BUN: 12 mg/dL (ref 6–20)
CO2: 20 mmol/L — ABNORMAL LOW (ref 22–32)
Calcium: 8.7 mg/dL — ABNORMAL LOW (ref 8.9–10.3)
Chloride: 112 mmol/L — ABNORMAL HIGH (ref 98–111)
Creatinine, Ser: 1.47 mg/dL — ABNORMAL HIGH (ref 0.61–1.24)
GFR, Estimated: 55 mL/min — ABNORMAL LOW (ref >60)
Glucose, Bld: 85 mg/dL (ref 70–99)
Potassium: 3.9 mmol/L (ref 3.5–5.1)
Sodium: 137 mmol/L (ref 135–145)

## 2021-07-06 LAB — PROTIME-INR
INR: 2.1 — ABNORMAL HIGH (ref 0.8–1.2)
Prothrombin Time: 23.2 seconds — ABNORMAL HIGH (ref 11.4–15.2)

## 2021-07-06 LAB — LACTATE DEHYDROGENASE: LDH: 193 U/L — ABNORMAL HIGH (ref 98–192)

## 2021-07-06 MED ORDER — POLYETHYLENE GLYCOL 3350 17 G PO PACK: 17.0000 g | PACK | Freq: Every day | ORAL | 0 refills | Status: DC

## 2021-07-06 MED ORDER — ADULT MULTIVITAMIN W/MINERALS CH
1.0000 | ORAL_TABLET | Freq: Every day | ORAL | 0 refills | Status: DC
Start: 2021-07-06 — End: 2021-10-11

## 2021-07-06 MED ORDER — ATORVASTATIN CALCIUM 40 MG PO TABS: 40.0000 mg | ORAL_TABLET | Freq: Every day | ORAL | 0 refills | Status: DC

## 2021-07-06 MED ORDER — LOSARTAN POTASSIUM 25 MG PO TABS: 25.0000 mg | ORAL_TABLET | Freq: Every day | ORAL | 0 refills | Status: DC

## 2021-07-06 MED ORDER — SENNA 8.6 MG PO TABS: 2.0000 | ORAL_TABLET | Freq: Every day | ORAL | 0 refills | Status: DC

## 2021-07-06 MED ORDER — TOPIRAMATE 50 MG PO TABS: 50.0000 mg | ORAL_TABLET | Freq: Two times a day (BID) | ORAL | 0 refills | Status: DC

## 2021-07-06 MED ORDER — POTASSIUM CHLORIDE CRYS ER 20 MEQ PO TBCR
20.0000 meq | EXTENDED_RELEASE_TABLET | Freq: Once | ORAL | Status: AC
  Administered 2021-07-06: 20 meq via ORAL
  Filled 2021-07-06: qty 1

## 2021-07-06 MED ORDER — MAGNESIUM OXIDE -MG SUPPLEMENT 400 (240 MG) MG PO TABS: 400.0000 mg | ORAL_TABLET | Freq: Two times a day (BID) | ORAL | 0 refills | Status: DC

## 2021-07-06 MED ORDER — WARFARIN SODIUM 2 MG PO TABS: 1.0000 mg | ORAL_TABLET | Freq: Every day | ORAL | 0 refills | Status: DC

## 2021-07-06 MED ORDER — OXYCODONE HCL 5 MG PO TABS: 5.0000 mg | ORAL_TABLET | Freq: Two times a day (BID) | ORAL | 0 refills | Status: DC | PRN

## 2021-07-06 MED ORDER — TAMSULOSIN HCL 0.4 MG PO CAPS
0.4000 mg | ORAL_CAPSULE | Freq: Every day | ORAL | 0 refills | Status: DC
Start: 2021-07-06 — End: 2021-08-02

## 2021-07-06 NOTE — Telephone Encounter (Signed)
Spoke with [name] regarding follow up appt at Dr Adline Mango (neurosurgery) office: Appt with Arlyss Repress NP 07/27/21 at 1 pm. [address] [place] [address]. [phone]. She verbalized understanding of all the above. ? ?Emerson Monte RN ?VAD Coordinator  ?Office: [phone]  ?24/7 Pager: [phone]  ? ?

## 2021-07-06 NOTE — Discharge Summary (Signed)
Physician Discharge Summary  ?Patient ID: ?David [name] ?MRN: [id number] ?DOB/AGE: 09/30/1965 56 y.o. ? ?Admit date: 06/28/2021 ?Discharge date: 07/06/2021 ? ?Discharge Diagnoses:  ?Principal Problem: ?  ICH (intracerebral hemorrhage) ([address]) ?Active Problems: ?  Chronic systolic heart failure ([place]) ?  PAF (paroxysmal atrial fibrillation) ([place]) ?  CKD (chronic kidney disease), stage III ([place]) ?  Gastric AVM ?  LVAD (left ventricular assist device) present ([place]) ?  SAH (subarachnoid hemorrhage) ([place]) ? ? ?Discharged Condition: stable ? ?Significant Diagnostic Studies: N/A ? ? ?Labs:  ?Basic Metabolic Panel: ?Recent Labs  ?Lab 06/30/21 ?0623 07/01/21 ?0559 07/02/21 ?5883 07/03/21 ?2549 07/04/21 ?0734 07/05/21 ?8264 07/06/21 ?1583  ?NA 137 136 137 135 136 140 137  ?K 4.4 3.8 4.6 4.2 4.1 4.3 3.9  ?CL 106 104 107 107 110 109 112*  ?CO2 23 24 23  20* 18* 24 20*  ?GLUCOSE 83 82 87 98 87 90 85  ?BUN 7 9 9 10 9 10 12   ?CREATININE 1.45* 1.44* 1.63* 1.54* 1.45* 1.71* 1.47*  ?CALCIUM 8.6* 8.6* 8.9 8.8* 8.8* 9.1 8.7*  ?MG 1.9  --   --   --   --   --   --   ? ? ?CBC: ?CBC Latest Ref Rng & Units 07/06/2021 07/03/2021 06/28/2021  ?WBC 4.0 - 10.5 K/uL 6.5 7.3 6.8  ?Hemoglobin 13.0 - 17.0 g/dL 12.8(L) 12.6(L) 12.3(L)  ?Hematocrit 39.0 - 52.0 % 40.1 39.7 38.0(L)  ?Platelets 150 - 400 K/uL 173 184 183  ?  ? ?INR: ?Lab Results  ?Component Value Date  ? INR 2.1 (H) 07/06/2021  ? INR 2.0 (H) 07/05/2021  ? INR 1.8 (H) 07/04/2021  ?  ?CBG: ?No results for input(s): GLUCAP in the last 168 hours. ? ?Brief HPI:   David [name] is a 56 y.o. male with history of CAD s/p ICD, LVAD HM III, gastric AVMs, LBP who was admitted on 06/05/21 with neck pain, progressive HA and somnolence. He was found to have acute hemorrhage in right caudothalamic groove with intraventricular clot in right lateral to 4th ventricle and obstructive changes. Coumadin reversed and EVD placed by Dr. Arnoldo Morale.  Mentation improved with serial CT showing resolution of hydrocephalus  but increase in bleed as well as occlusion of ventriculostomy requiring drain replacement on 02/18.  Hospital course was significant for issues with intermittent confusion, hypoxia due to fluid overload, acute on chronic renal failure, urinary retention requiring Foley placement as well as ongoing headaches.  Coumadin was resumed on 02/28 and he did have worsening of mental status however CT head showed no significant change change in size of large IVH.  Therapy was initiated and patient was limited by HD, cognitive deficits and debility. CIR was recommended due to functional decline.  ? ? ?Hospital Course: David [name] was admitted to rehab 06/28/2021 for inpatient therapies to consist of PT, ST and OT at least three hours five days a week. Past admission physiatrist, therapy team and rehab RN have worked together to provide customized collaborative inpatient rehab.  Foley was removed after discharge and patient is voiding without difficulty.  Heart failure team has been following for input on BP as well as cardiac medication management.  He was bolused with 250 cc IVF on 03/13 due to low flow alarms and weight is 76 Kg at discharge. Serial check of lytes showed downward trend in K-3.9 which was treated with dose of Kdur 20 meq prior to d/c.  Follow up CBC showed H/H to be stable. His  po intake continued to be variable and Nutritional supplements were added to help promote healing.   ? ?He continued to be limited by HA therefore gabapentin was discontinued and Topamax was added to help manage symptoms. This as  titrated to 50 mg twice daily with decrease in use of prn oxycodone.  Wife decline Rx for narcotics at d/c.  Pharmacy has been assisting in management and dosing of Coumadin to keep INR 1.5-2.0 range.  INR is 2.1 at discharge and wife was advised to hold Coumadin today and resume it at 1 mg in AM.  She is to recheck INR on Friday and call heart failure clinic with results. Supervision is recommended for  safety due to left field cut, delay in processing and poor awareness of deficits.  He will continue to receive outpatient PT, OT and ST at [facility] after discharge ? ? ?Rehab course: During patient's stay in rehab weekly team conferences were held to monitor patient's progress, set goals and discuss barriers to discharge. At admission, patient required contact-guard to min assist with mobility and min assist with ADL tasks.  He exhibited cognitive impairments with SLUMS score of 16/30. He  has had improvement in activity tolerance, balance, postural control as well as ability to compensate for deficits.  He is able to complete ADL tasks with supervision.  He requires supervision with verbal cues for transfers and to ambulate 750 feet without assistive device.  He continues to be limited by left field cut.  His cognition continues to improve slowly and he requires min to mod assist for basic cognitive tasks, problem-solving and intellectual awareness of deficits.  Family education was completed with wife. ? ?Disposition: Home ? ?Diet: Heart healthy.  ? ?Special Instructions: ?Recheck INR on Friday and contact HF clinic with results. ?Family to assist with medication management and cognitive tasks.  ? ?Discharge Instructions   ? ? Ambulatory referral to Physical Medicine Rehab   Complete by: As directed ?  ? Hospital follow up in 2 weeks  ? ?  ? ?Allergies as of 07/06/2021   ? ?   Reactions  ? Plavix [clopidogrel Bisulfate] Hives  ? ?  ? ?  ?Medication List  ?  ? ?STOP taking these medications   ? ?Chlorhexidine Gluconate Cloth 2 % Pads ?  ?docusate sodium 100 MG capsule ?Commonly known as: COLACE ?  ?gabapentin 300 MG capsule ?Commonly known as: NEURONTIN ?  ?hydrALAZINE 20 MG/ML injection ?Commonly known as: APRESOLINE ?  ?ipratropium-albuterol 0.5-2.5 (3) MG/3ML Soln ?Commonly known as: DUONEB ?  ?oxyCODONE 5 MG immediate release tablet ?Commonly known as: Oxy IR/ROXICODONE ?  ?traMADol 50 MG  tablet ?Commonly known as: ULTRAM ?  ? ?  ? ?TAKE these medications   ? ?acetaminophen 325 MG tablet ?Commonly known as: TYLENOL ?Take 2 tablets (650 mg total) by mouth every 4 (four) hours as needed for headache or mild pain. ?  ?amiodarone 200 MG tablet ?Commonly known as: Pacerone ?Take 1 tablet (200 mg total) by mouth daily. ?  ?atorvastatin 40 MG tablet ?Commonly known as: LIPITOR ?Take 1 tablet (40 mg total) by mouth daily. ?  ?losartan 25 MG tablet ?Commonly known as: COZAAR ?Take 1 tablet (25 mg total) by mouth daily. ?  ?magnesium oxide 400 (240 Mg) MG tablet ?Commonly known as: MAG-OX ?Take 1 tablet (400 mg total) by mouth 2 (two) times daily. ?  ?mouth rinse Liqd solution ?15 mLs by Mouth Rinse route 2 (two) times daily. ?  ?  multivitamin with minerals Tabs tablet ?Take 1 tablet by mouth daily. ?  ?pantoprazole 40 MG tablet ?Commonly known as: PROTONIX ?Take 1 tablet (40 mg total) by mouth daily. ?  ?polyethylene glycol 17 g packet ?Commonly known as: MIRALAX / GLYCOLAX ?Take 17 g by mouth daily. ?  ?polyvinyl alcohol 1.4 % ophthalmic solution ?Commonly known as: LIQUIFILM TEARS ?Place 2 drops into both eyes as needed for dry eyes. ?  ?senna 8.6 MG Tabs tablet ?Commonly known as: SENOKOT ?Take 2 tablets (17.2 mg total) by mouth at bedtime. ?  ?tamsulosin 0.4 MG Caps capsule ?Commonly known as: FLOMAX ?Take 1 capsule (0.4 mg total) by mouth daily. ?  ?topiramate 50 MG tablet ?Commonly known as: TOPAMAX ?Take 1 tablet (50 mg total) by mouth 2 (two) times daily. ?  ?traZODone 50 MG tablet ?Commonly known as: DESYREL ?TAKE 1 TABLET BY MOUTH ONCE DAILY AT BEDTIME ?  ?warfarin 2 MG tablet ?Commonly known as: COUMADIN ?Take as directed. If you are unsure how to take this medication, talk to your nurse or doctor. ?Original instructions: Take 0.5 tablets (1 mg total) by mouth daily at 4 PM. Start this tomorrow evening ?Start taking on: July 07, 2021 ?What changed:  ?how much to take ?when to take this ?additional  instructions ?  ? ?  ? ? Follow-up Information   ? ? Kirsteins, Luanna Salk, MD Follow up.   ?Specialty: Physical Medicine and Rehabilitation ?Why: office will call you with follow up appointment ?Contact infor

## 2021-07-06 NOTE — Progress Notes (Addendum)
ANTICOAGULATION CONSULT NOTE ? ?Pharmacy Consult for warfarin ?Indication:  LVAD ? ?Allergies  ?Allergen Reactions  ? Plavix [Clopidogrel Bisulfate] Hives  ? ? ?Patient Measurements: ?Height: 5\' 5"  (165.1 cm) ?Weight: 76 kg (167 lb 8.8 oz) ?IBW/kg (Calculated) : 61.5 ? ?Vital Signs: ?Temp: 98.1 ?F (36.7 ?C) (03/14 2111) ?Temp Source: Oral (03/14 5520) ?BP: 106/85 (03/14 8022) ?Pulse Rate: 66 (03/14 0833) ? ?Labs: ?Recent Labs  ?  07/04/21 ?0734 07/05/21 ?3361 07/06/21 ?2244  ?HGB  --   --  12.8*  ?HCT  --   --  40.1  ?PLT  --   --  173  ?LABPROT 21.3* 22.3* 23.2*  ?INR 1.8* 2.0* 2.1*  ?CREATININE 1.45* 1.71* 1.47*  ? ? ? ?Estimated Creatinine Clearance: 52.8 mL/min (A) (by C-G formula based on SCr of 1.47 mg/dL (H)). ? ? ?Medical History: ?Past Medical History:  ?Diagnosis Date  ? AICD (automatic cardioverter/defibrillator) present   ? Anemia   ? Anxiety   ? CAD (coronary artery disease) 2009  ? AMI in 12/2007 with PCI to LAD, staged PCI to  mid/distal RCA, NSTEMI in 02/2009 with BMS to LCx  ? CHF (congestive heart failure) ([place])   ? Chronic kidney disease 11/03/2016  ? stage 3 kidney disease  ? Dysrhythmia   ? "arrythmia problems at some point", "fatal rhythms"  ? History of blood transfusion   ? "I was bleeding from was rectum" (04/19/2017)  ? HLD (hyperlipidemia)   ? HTN (hypertension)   ? Hyperlipidemia 03/15/2011  ? Ischemic cardiomyopathy   ? Admitted in 07/2010 with CHF exacerbation  ? Myocardial infarction [facility])   ? "I've had 4" (04/19/2017)  ? Pneumonia 2018 X 1  ? Seizures ([place])   ? one seizure in 04/2016 during cardiac event (04/19/2017)  ? STEMI 2009 (anterior), 2010 (lateral), and 2012 (inferior) 03/14/2011  ? ? ?Assessment: ?79 yoM on warfarin PTA for hx HM3 LVAD. Pt admitted 2/11 with ICH requiring ventriculostomy. INR 2.4 on admit reversed with vitamin K and 4FPCC. Ventriculostomy drain removed 2/25. ? ?Warfarin ordered to resume 2/27 but not given until 3/1, head CT stable. INR today is slightly  supratherapeutic at 2.1. No s/sx of bleeding. LDH stable at 193. Only 25-75% of oral intake documented.  ? ?*Home warfarin dose 4mg  Fri, 2mg  AODs ? ?Goal of Therapy:  ?INR 1.5-2.0 (given ICH on warfarin within therapeutic range) ?Monitor platelets by anticoagulation protocol: Yes ?  ?Plan:  ?-Hold warfarin tonight to let INR trend down ?-Plan to discharge on warfarin 1 mg daily starting 3/15 - plan for INR check with HF Clinic on Friday morning (Pharmacy spoke with wife who was alerted to plan and will assist with home check that morning) ? ?Antonietta Jewel, PharmD, BCCCP ?Clinical Pharmacist  ?Phone: [phone] ?07/06/2021 8:46 AM ? ?Please check AMION for all [facility] phone numbers ?After 10:00 PM, call [facility] [phone] ? ? ?

## 2021-07-06 NOTE — Progress Notes (Signed)
Inpatient Rehabilitation Care Coordinator ?Discharge Note  ? ?Patient Details  ?Name: David [name] ?MRN: [id number] ?Date of Birth: 02/18/1966 ? ? ?Discharge location: Home ? ?Length of Stay: 8 Days ? ?Discharge activity level: Supervision ? ?Home/community participation: Spouse ? ?Patient response HW:TUUEKC Literacy - How often do you need to have someone help you when you read instructions, pamphlets, or other written material from your doctor or pharmacy?: Never ? ?Patient response MK:LKJZPH Isolation - How often do you feel lonely or isolated from those around you?: Never ? ?Services provided included: SW, Pharmacy, TR, CM, RN, SLP, OT, PT, RD, MD ? ?Financial Services:  ?Charity fundraiser Utilized: Private Insurance ?[employer] Medicare ? ?Choices offered to/list presented to: spouse ? ?Follow-up services arranged:  ?Outpatient ?   ?Outpatient Servicies:  health op ?  ?  ? ?Patient response to transportation need: ?Is the patient able to respond to transportation needs?: Yes ?In the past 12 months, has lack of transportation kept you from medical appointments or from getting medications?: No ?In the past 12 months, has lack of transportation kept you from meetings, work, or from getting things needed for daily living?: No ? ? ? ?Comments (or additional information): ? ?Patient/Family verbalized understanding of follow-up arrangements:  Yes ? ?Individual responsible for coordination of the follow-up plan: spouse ? ?Confirmed correct DME delivered: Dyanne Iha 07/06/2021   ? ?Dyanne Iha ?

## 2021-07-06 NOTE — Plan of Care (Signed)
?  Problem: RH Problem Solving ?Goal: LTG Patient will demonstrate problem solving for (SLP) ?Description: LTG:  Patient will demonstrate problem solving for basic/complex daily situations with cues  (SLP) ?Outcome: Adequate for Discharge ?  ?Problem: RH Memory ?Goal: LTG Patient will demonstrate ability for day to day (SLP) ?Description: LTG:   Patient will demonstrate ability for day to day recall/carryover during cognitive/linguistic activities with assist  (SLP) ?Outcome: Adequate for Discharge ?  ?Problem: RH Memory ?Goal: LTG Patient will use memory compensatory aids to (SLP) ?Description: LTG:  Patient will use memory compensatory aids to recall biographical/new, daily complex information with cues (SLP) ?Outcome: Completed/Met ?  ?Problem: RH Awareness ?Goal: LTG: Patient will demonstrate awareness during functional activites type of (SLP) ?Description: LTG: Patient will demonstrate awareness during functional activites type of (SLP) ?Outcome: Completed/Met ?  ?

## 2021-07-06 NOTE — Progress Notes (Signed)
Inpatient Rehabilitation Discharge Medication Review by a Pharmacist ? ?A complete drug regimen review was completed for this patient to identify any potential clinically significant medication issues. ? ?High Risk Drug Classes Is patient taking? Indication by Medication  ?Antipsychotic No   ?Anticoagulant Yes Warfarin- PAF  ?Antibiotic No   ?Opioid Yes OxyIR, tramadol- acute pain  ?Antiplatelet No   ?Hypoglycemics/insulin No   ?Vasoactive Medication Yes Amiodarone, cozaar, tamsulosin- rate control, hypertension, BPH  ?Chemotherapy No   ?Other Yes Lipitor- HLD ?Protonix- GERD ?Trazodone- sleep  ? ? ? ?Type of Medication Issue Identified Description of Issue Recommendation(s)  ?Drug Interaction(s) (clinically significant) ?    ?Duplicate Therapy ?    ?Allergy ?    ?No Medication Administration End Date ?    ?Incorrect Dose ?    ?Additional Drug Therapy Needed ?    ?Significant med changes from prior encounter (inform family/care partners about these prior to discharge).    ?Other ? PTA med: ?zyrtec Continue at discharge for seasonal allergies  ? ? ?Clinically significant medication issues were identified that warrant physician communication and completion of prescribed/recommended actions by midnight of the next day:  No ? ?Time spent performing this drug regimen review (minutes):  30 ? ? ?Kurt Hoffmeier BS, PharmD, BCPS ?Clinical Pharmacist ?07/06/2021 10:59 AM ?

## 2021-07-06 NOTE — Plan of Care (Signed)
?  Problem: RH Balance ?Goal: LTG Patient will maintain dynamic standing with ADLs (OT) ?Description: LTG:  Patient will maintain dynamic standing balance with assist during activities of daily living (OT)  ?Outcome: Completed/Met ?  ?Problem: RH Grooming ?Goal: LTG Patient will perform grooming w/assist,cues/equip (OT) ?Description: LTG: Patient will perform grooming with assist, with/without cues using equipment (OT) ?Outcome: Completed/Met ?  ?Problem: RH Bathing ?Goal: LTG Patient will bathe all body parts with assist levels (OT) ?Description: LTG: Patient will bathe all body parts with assist levels (OT) ?Outcome: Completed/Met ?  ?Problem: RH Dressing ?Goal: LTG Patient will perform upper body dressing (OT) ?Description: LTG Patient will perform upper body dressing with assist, with/without cues (OT). ?Outcome: Completed/Met ?Goal: LTG Patient will perform lower body dressing w/assist (OT) ?Description: LTG: Patient will perform lower body dressing with assist, with/without cues in positioning using equipment (OT) ?Outcome: Completed/Met ?  ?Problem: RH Toileting ?Goal: LTG Patient will perform toileting task (3/3 steps) with assistance level (OT) ?Description: LTG: Patient will perform toileting task (3/3 steps) with assistance level (OT)  ?Outcome: Completed/Met ?  ?Problem: RH Functional Use of Upper Extremity ?Goal: LTG Patient will use RT/LT upper extremity as a (OT) ?Description: LTG: Patient will use right/left upper extremity as a stabilizer/gross assist/diminished/nondominant/dominant level with assist, with/without cues during functional activity (OT) ?Outcome: Completed/Met ?  ?Problem: RH Toilet Transfers ?Goal: LTG Patient will perform toilet transfers w/assist (OT) ?Description: LTG: Patient will perform toilet transfers with assist, with/without cues using equipment (OT) ?Outcome: Completed/Met ?  ?Problem: RH Tub/Shower Transfers ?Goal: LTG Patient will perform tub/shower transfers w/assist  (OT) ?Description: LTG: Patient will perform tub/shower transfers with assist, with/without cues using equipment (OT) ?Outcome: Completed/Met ?  ?Problem: RH Memory ?Goal: LTG Patient will demonstrate ability for day to day recall/carry over during activities of daily living with assistance level (OT) ?Description: LTG:  Patient will demonstrate ability for day to day recall/carry over during activities of daily living with assistance level (OT). ?Outcome: Completed/Met ?  ?

## 2021-07-06 NOTE — Progress Notes (Signed)
?                                                       PROGRESS NOTE ? ? ?Subjective/Complaints: ?Appreciate cardiology note , labs reviewed , pt remembers discharge day but cannot recall day of week , No HA ? ?ROS- neg CP, SOB, N/V/D ? ?Objective: ?  ?No results found. ?Recent Labs  ?  07/06/21 ?3762  ?WBC 6.5  ?HGB 12.8*  ?HCT 40.1  ?PLT 173  ? ? ?Recent Labs  ?  07/05/21 ?8315 07/06/21 ?1761  ?NA 140 137  ?K 4.3 3.9  ?CL 109 112*  ?CO2 24 20*  ?GLUCOSE 90 85  ?BUN 10 12  ?CREATININE 1.71* 1.47*  ?CALCIUM 9.1 8.7*  ? ? ? ?Intake/Output Summary (Last 24 hours) at 07/06/2021 0735 ?Last data filed at 07/05/2021 2044 ?Gross per 24 hour  ?Intake 1011.78 ml  ?Output --  ?Net 1011.78 ml  ? ?  ? ?  ? ?Physical Exam: ?Vital Signs ?Blood pressure (!) 152/134, pulse 74, temperature 98.1 ?F (36.7 ?C), temperature source Oral, resp. rate 18, height 5\' 5"  (1.651 m), weight 76 kg, SpO2 100 %. ? ? ?General: No acute distress ?Mood and affect are appropriate ?Heart: Regular rate and rhythm no rubs murmurs or extra sounds ?Lungs: Clear to auscultation, breathing unlabored, no rales or wheezes ?Abdomen: Positive bowel sounds, soft nontender to palpation, nondistended ?Extremities: No clubbing, cyanosis, or edema ? ?Skin: No evidence of breakdown, no evidence of rash, RIght frontal EVD scalp wound healing well  ?Neurologic: Cranial nerves II through XII intact, motor strength is 5/5 in bilateral deltoid, bicep, tricep, grip, hip flexor, knee extensors, ankle dorsiflexor and plantar flexor ? ?Musculoskeletal: Full range of motion in all 4 extremities. No joint swelling ? ? ?Assessment/Plan: ?1. Functional deficits Right thalamocapsular infarct  ?Stable for D/C today ?F/u PCP in 3-4 weeks ?F/u cardiology 1-2 wk ?F/u PM&R 2 weeks ?See D/C summary ?See D/C instructions ? ?Care Tool: ? ?Bathing ?   ?Body parts bathed by patient: Right arm, Left arm, Chest, Abdomen, Front perineal area, Buttocks, Right upper leg, Left upper leg, Right  lower leg, Left lower leg, Face  ?   ?  ?  ?Bathing assist Assist Level: Minimal Assistance - Patient > 75% ?  ?  ?Upper Body Dressing/Undressing ?Upper body dressing   ?What is the patient wearing?: Pull over shirt ?   ?Upper body assist Assist Level: Supervision/Verbal cueing ?   ?Lower Body Dressing/Undressing ?Lower body dressing ? ? ?   ?What is the patient wearing?: Pants, Underwear/pull up ? ?  ? ?Lower body assist Assist for lower body dressing: Supervision/Verbal cueing ?   ? ?Toileting ?Toileting    ?Toileting assist Assist for toileting: Supervision/Verbal cueing ?  ?  ?Transfers ?Chair/bed transfer ? ?Transfers assist ?   ? ?Chair/bed transfer assist level: Supervision/Verbal cueing ?  ?  ?Locomotion ?Ambulation ? ? ?Ambulation assist ? ?   ? ?Assist level: Supervision/Verbal cueing ?Assistive device: No Device ?Max distance: >300'  ? ?Walk 10 feet activity ? ? ?Assist ?   ? ?Assist level: Supervision/Verbal cueing ?Assistive device: No Device  ? ?Walk 50 feet activity ? ? ?Assist   ? ?Assist level: Supervision/Verbal cueing ?Assistive device: No Device  ? ? ?Walk 150 feet activity ? ? ?Assist   ? ?  Assist level: Supervision/Verbal cueing ?Assistive device: No Device ?  ? ?Walk 10 feet on uneven surface  ?activity ? ? ?Assist   ? ? ?Assist level: Contact Guard/Touching assist ?Assistive device: Other (comment) (no device)  ? ?Wheelchair ? ? ? ? ?Assist Is the patient using a wheelchair?: No ?  ?  ? ?  ?   ? ? ?Wheelchair 50 feet with 2 turns activity ? ? ? ?Assist ? ?  ?  ? ? ?   ? ?Wheelchair 150 feet activity  ? ? ? ?Assist ?   ? ? ?   ? ?Blood pressure (!) 152/134, pulse 74, temperature 98.1 ?F (36.7 ?C), temperature source Oral, resp. rate 18, height 5\' 5"  (1.651 m), weight 76 kg, SpO2 100 %. ? ?Medical Problem List and Plan: ?1. 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            -patient may shower ?            -ELOS/Goals: S 07/06/21 ?             ?2.   Antithrombotics: ?-DVT/anticoagulation:  Pharmaceutical: Coumadin resumed 03/01.  ?            -antiplatelet therapy: N/A--off ASA due to AVM/GIB ?3. Headaches/Pain Management:  HA since bleed ?            --oxycodone effective and being tapered to bid with ultram additionally prn. Improved , cont  topamax 50mg  BID ?            --gabapentin for neuropathic chest pain will d/c given trial of topirimate ?            --back pain since injury a few months ago  ?4. Mood: LCSW to follow for evaluation and support.  ?            -antipsychotic agents: N/A ?5. Neuropsych: This patient is capable of making decisions on her own behalf. ?6. Skin/Wound Care: scalp incision CDI healing well ?7. Fluids/Electrolytes/Nutrition: Strict I/O. ?-daily wts. Monitor for signs of overload.  ?8. CAD/ICM s/p LVAD/ICD: LVAD checks daily per protocol ?            --continue Cozaar, Lipitor and amiodarone. - as per CHF team  ?Vitals:  ? 07/05/21 2200 07/06/21 0605  ?BP:  (!) 152/134  ?Pulse:  74  ?Resp:  18  ?Temp:  98.1 ?F (36.7 ?C)  ?SpO2: 96% 100%  ?Elevated BP this am but has been well controlled would recheck prior to discharge  ? ?9. H/o A fib: Monitor HR TID--continue amiodarone daily. Rate controlled  ?10. Urinary retention: Continue Flomax  ?--+BM 03/05-->d/c foley and monitor voiding function  ?11. Constipation: On Senna and miralax--augment as needed ?  ? ?LOS: ?8 days ?A FACE TO FACE EVALUATION WAS PERFORMED ? ?Luanna Salk Anselm Aumiller ?07/06/2021, 7:35 AM  ? ? ? ?

## 2021-07-06 NOTE — Progress Notes (Addendum)
?Advanced Heart Failure VAD Team Note ? ?PCP-Cardiologist: Glori Bickers, MD  ? ?Subjective:   ? ?2/11 Presented with ICH ?2/11 Underwent bedside ventriculostomy wit NSU ?2/12 Repeat head CT with resolution of hydrocephalus. Persistent hematoma ?2/17 Repeat CT head with increased hemorrhage in right lateral ventricle, slightly increased ventricular dilation.  The ventriculostomy drain is occluded.  ?2/18 Ventriculostomy drain replaced ?2/25 CT improved ventriculomegaly. Drain pulled ?2/27 F/u CT w/ stable intraparenchymal hemorrhage. Unchanged from prior.    ?3/1 Coumadin restarted  ?3/4 Confusion. Head CT no change from prior ?3/6 Discharged to CIR  ?3/13 Low flow alarms, given 250 cc IV fluids ? ?Scr improved, 1.71>1.47. Received IV fluids yesterday.  ? ?INR 2.1 ? ?Fewer PI events overnight. He is trying to do a better job with hydration. ? ?MAPs 80s-90s, 1 MAP 141 (? Erroneous) ? ?No dyspnea or CP. ? ?Planning for discharge home today. ? ?LVAD INTERROGATION:  ?HeartMate III LVAD:   ?Flow 4.1 liters/min, speed 5400, power 4.1 PI 6.8. 1 PI event. VAD interrogated personally. Parameters stable. ? ?Objective:   ? ?Vital Signs:   ?Temp:  [97.7 ?F (36.5 ?C)-98.1 ?F (36.7 ?C)] 98.1 ?F (36.7 ?C) (03/14 3154) ?Pulse Rate:  [64-81] 66 (03/14 0833) ?Resp:  [15-18] 18 (03/14 0086) ?BP: (97-152)/(77-134) 106/85 (03/14 7619) ?SpO2:  [96 %-100 %] 100 % (03/14 0605) ?Weight:  [76 kg] 76 kg (03/14 0500) ?Last BM Date : 07/04/21 ?Mean arterial Pressure 80s-90s ? ?Intake/Output:  ? ?Intake/Output Summary (Last 24 hours) at 07/06/2021 0910 ?Last data filed at 07/05/2021 2044 ?Gross per 24 hour  ?Intake 771.78 ml  ?Output --  ?Net 771.78 ml  ?  ? ?Physical Exam  ? ?Physical Exam: ?GENERAL: No distress. Lying in bed. ?HEENT: normal  ?NECK: Supple, No JVD.  2+ bilaterally, no bruits.   ?CARDIAC:  Mechanical heart sounds with LVAD hum present.  ?LUNGS:  Clear to auscultation bilaterally.  ?ABDOMEN:  Soft, round, nontender, positive  bowel sounds x4.     ?LVAD exit site: well-healed and incorporated.  Dressing dry and intact.  No erythema or drainage.  Stabilization device present and accurately applied.  Driveline dressing is being changed daily per sterile technique. ?EXTREMITIES:  Warm and dry, no cyanosis, clubbing, rash or edema  ?NEUROLOGIC:  Alert and oriented x 4.  Gait steady.  No aphasia.  No dysarthria.  Affect pleasant.    ? ? ? ?Labs  ? ?Basic Metabolic Panel: ?Recent Labs  ?Lab 06/30/21 ?0623 07/01/21 ?0559 07/02/21 ?5093 07/03/21 ?2671 07/04/21 ?0734 07/05/21 ?2458 07/06/21 ?0998  ?NA 137   < > 137 135 136 140 137  ?K 4.4   < > 4.6 4.2 4.1 4.3 3.9  ?CL 106   < > 107 107 110 109 112*  ?CO2 23   < > 23 20* 18* 24 20*  ?GLUCOSE 83   < > 87 98 87 90 85  ?BUN 7   < > 9 10 9 10 12   ?CREATININE 1.45*   < > 1.63* 1.54* 1.45* 1.71* 1.47*  ?CALCIUM 8.6*   < > 8.9 8.8* 8.8* 9.1 8.7*  ?MG 1.9  --   --   --   --   --   --   ? < > = values in this interval not displayed.  ? ? ?Liver Function Tests: ?No results for input(s): AST, ALT, ALKPHOS, BILITOT, PROT, ALBUMIN in the last 168 hours. ?No results for input(s): LIPASE, AMYLASE in the last 168 hours. ?No results for input(s):  AMMONIA in the last 168 hours. ? ?CBC: ?Recent Labs  ?Lab 07/03/21 ?9449 07/06/21 ?6759  ?WBC 7.3 6.5  ?HGB 12.6* 12.8*  ?HCT 39.7 40.1  ?MCV 84.1 83.4  ?PLT 184 173  ? ? ?INR: ?Recent Labs  ?Lab 07/02/21 ?0712 07/03/21 ?1638 07/04/21 ?0734 07/05/21 ?4665 07/06/21 ?9935  ?INR 1.8* 1.8* 1.8* 2.0* 2.1*  ? ? ?Other results: ? ? ?Imaging  ? ?No results found. ? ? ?Medications:   ? ? ?Scheduled Medications: ? amiodarone  200 mg Oral Daily  ? atorvastatin  40 mg Oral Daily  ? Chlorhexidine Gluconate Cloth  6 each Topical Daily  ? feeding supplement  1 Container Oral TID BM  ? losartan  25 mg Oral Daily  ? magnesium oxide  400 mg Oral BID  ? mouth rinse  15 mL Mouth Rinse BID  ? multivitamin with minerals  1 tablet Oral Daily  ? pantoprazole  40 mg Oral Daily  ? polyethylene  glycol  17 g Oral Daily  ? senna-docusate  2 tablet Oral QPC supper  ? tamsulosin  0.4 mg Oral Daily  ? topiramate  50 mg Oral BID  ? traZODone  50 mg Oral QHS  ? Warfarin - Pharmacist Dosing Inpatient   Does not apply T0177  ? ? ?Infusions: ? ? ?PRN Medications: ?acetaminophen, alum & mag hydroxide-simeth, bisacodyl, diphenhydrAMINE, guaiFENesin-dextromethorphan, hydrALAZINE, ipratropium-albuterol, lidocaine, oxyCODONE, polyethylene glycol, polyvinyl alcohol, prochlorperazine **OR** prochlorperazine **OR** prochlorperazine, sodium phosphate, sorbitol, traMADol, traZODone ? ? ? ?Assessment/Plan:   ? ?1. Intracerebral hemorrhage ?- CT brain with intracerebral and intraventricular hemorrhage with obstructive hydrocephalus. INR 2.4 ?- AC reversed with Kcentra/vit k in ER ?- Underwent bedside ventriculostomy on 2/11 ?- Initial ventriculostomy drain occluded. CT head 2/17 with increased hemorrhage in right lateral ventricle, slightly increased ventricular dilation.  On 2/18, ventriculostomy drain replaced.  ?- CT 2/25 improved ventriculomegaly. Drain pulled 2/25 ?- CT 2/27 stable intraparenchymal hemorrhage in the frontal lobe and basal ganglia on the right. Midline shift is slightly reduced from the prior exam. ?- Increased confusion on 03/04. CT with stable intraparenchymal hemorrhage.  ?- Stable. Continues with mild HA.  ?- On coumadin. INR 2.1 Dosing per PharmD. Goal 1.5-2. Holding warfarin tonight then restarting tomorrow. ?  ?2. Chronic systolic CHF with HM III LVAD implant 04/20/17:  ?- Has done well with VAD support. ?- He has been seen at [facility] for transplant referral but has decided not to pursue transplant. No change ?- New oxygen requirement with pulmonary edema on 2/17 CXR => good diuresis on 2/18.  ?-  2/27 and 2/28 Coumadin ordered not but given. Started 03/01. INR 2.1 today. Discussed dosing with PharmD personally. ?-  Received 250 cc NS yesterday. Fewer PI events overnight. Scr 1.7>1.45. Continue to  encourage him to increase po intake.  ?- MAPs 80s-90s. On losartan 12.5 mg daily. Follow, may need to adjust medications at f/u. ?- LDH ok ?  ?3. Paroxysmal AFL with RVR ?- had DC-CV on 10/17/18 with subsequent short episodes of AF after but has been mostly in NSR on amio ?- Had recurrent AF in 10/22.  ?- not on the monitor. Rate controlled.  ?- Continue amio 200 mg daily ?- Continue coumadin.  ?  ?4. h/o UGI AVM bleed ?- s/p APC x 4 lesions in 2/19 ?- No recurrent bleeding  ?- Continue PPI ?- Off ASA due to GIB  ?  ?5. AKI on CKD Stage IIIa:  ?- Creatinine baseline 1.3 - 1.7 ?- SCr 1.45>1.71>>1.45  today after IV fluids  ?  ?6. VAD management ?-  VAD interrogated personally. Parameters stable. ?- LDH has been stable. 179 today ?- INR goal 1.5-2.0 Off ASA due to GIB 2/19.  ?- INR 2.4 reversed due to [place]. INR 2.1 today.  ?- Continue coumadin. Dosing as above. ?- Maps/LDH stable. MAPs 80s-90s today. Monitor. Will not aggressively manage BP d/t concern for developing hypotension with poor po intake ?- DL site looks good ?  ?6. HTN ?- goal MAP 70-90 with ICH  ?- losartan increased 2/14 with resultant hypotension and AKI. Hydralazine stopped.  ?- Losartan decreased to 25 mg daily on 02/18 d/t bump in Scr.  ?- MAPs more elevated today. Will watch and adjust meds as needed at f/u. See above ? ?7. CAD:   ?- s/p PCI RCA/PLOM with DES x2 and DES to mild LAD 1/18 ?- No s/s angina ?- Continue statin. Off ASA due to GIB ?  ?8. Urinary retention ?- continue Flomax  ?- Foley out ?  ?9. Hypomagnesemia ?- Continue PO Mag supp  ? ? ? ?Rehab staff appreciated. Potentially going home today.  ? ?Has f/u in VAD clinic arranged. ? ?Hold Warfarin tonight. Restart at 1 mg daily tomorrow. He will check INR at home on Friday, 03/17. HF clinic PharmD will subsequently dose Warfarin. ? ?Arrange f/u with Neurosurgery. ? ?I reviewed the LVAD parameters from today, and compared the results to the patient's prior recorded data.  No programming  changes were made.  The LVAD is functioning within specified parameters.  The patient performs LVAD self-test daily.  LVAD interrogation was negative for any significant power changes, alarms or PI events/speed drop

## 2021-07-06 NOTE — Plan of Care (Signed)
?  Problem: RH Balance ?Goal: LTG Patient will maintain dynamic standing balance (PT) ?Description: LTG:  Patient will maintain dynamic standing balance with assistance during mobility activities (PT) ?Outcome: Completed/Met ?Flowsheets (Taken 07/05/2021 1700) ?LTG: Pt will maintain dynamic standing balance during mobility activities with:: Independent with assistive device  ?  ?Problem: Sit to Stand ?Goal: LTG:  Patient will perform sit to stand with assistance level (PT) ?Description: LTG:  Patient will perform sit to stand with assistance level (PT) ?Outcome: Completed/Met ?Flowsheets (Taken 07/05/2021 1700) ?LTG: PT will perform sit to stand in preparation for functional mobility with assistance level: Independent ?  ?Problem: RH Bed Mobility ?Goal: LTG Patient will perform bed mobility with assist (PT) ?Description: LTG: Patient will perform bed mobility with assistance, with/without cues (PT). ?Outcome: Completed/Met ?Flowsheets (Taken 07/05/2021 1700) ?LTG: Pt will perform bed mobility with assistance level of: Independent ?  ?Problem: RH Bed to Chair Transfers ?Goal: LTG Patient will perform bed/chair transfers w/assist (PT) ?Description: LTG: Patient will perform bed to chair transfers with assistance (PT). ?Outcome: Completed/Met ?Flowsheets (Taken 06/30/2021 0534) ?LTG: Pt will perform Bed to Chair Transfers with assistance level: Supervision/Verbal cueing ?  ?Problem: RH Car Transfers ?Goal: LTG Patient will perform car transfers with assist (PT) ?Description: LTG: Patient will perform car transfers with assistance (PT). ?Outcome: Completed/Met ?Flowsheets (Taken 06/30/2021 0534) ?LTG: Pt will perform car transfers with assist:: Supervision/Verbal cueing ?  ?Problem: RH Furniture Transfers ?Goal: LTG Patient will perform furniture transfers w/assist (OT/PT) ?Description: LTG: Patient will perform furniture transfers  with assistance (OT/PT). ?Outcome: Completed/Met ?Flowsheets (Taken 07/05/2021 1700) ?LTG: Pt  will perform furniture transfers with assist:: Supervision/Verbal cueing ?  ?Problem: RH Ambulation ?Goal: LTG Patient will ambulate in controlled environment (PT) ?Description: LTG: Patient will ambulate in a controlled environment, # of feet with assistance (PT). ?Outcome: Completed/Met ?Flowsheets (Taken 06/30/2021 0534) ?LTG: Pt will ambulate in controlled environ  assist needed:: Supervision/Verbal cueing ?LTG: Ambulation distance in controlled environment: >200 ft with no AD and improved L awareness ?Goal: LTG Patient will ambulate in home environment (PT) ?Description: LTG: Patient will ambulate in home environment, # of feet with assistance (PT). ?Outcome: Completed/Met ?Flowsheets (Taken 06/30/2021 0534) ?LTG: Pt will ambulate in home environ  assist needed:: Supervision/Verbal cueing ?LTG: Ambulation distance in home environment: 50 feet with no AD and improved L attention ?  ?Problem: RH Stairs ?Goal: LTG Patient will ambulate up and down stairs w/assist (PT) ?Description: LTG: Patient will ambulate up and down # of stairs with assistance (PT) ?Outcome: Completed/Met ?Flowsheets (Taken 06/30/2021 0534) ?LTG: Pt will ambulate up/down stairs assist needed:: Supervision/Verbal cueing ?LTG: Pt will  ambulate up and down number of stairs: at least 2 steps with no HR/ AD ?  ?

## 2021-07-08 ENCOUNTER — Ambulatory Visit (HOSPITAL_COMMUNITY): Payer: Self-pay | Admitting: Pharmacist

## 2021-07-08 ENCOUNTER — Telehealth: Payer: Self-pay

## 2021-07-08 LAB — POCT INR: INR: 2.1 (ref 2.0–3.0)

## 2021-07-08 NOTE — Telephone Encounter (Signed)
Transitional Care call--who you talked with [name]- wife ? ? ? ?Are you/is patient experiencing any problems since coming home? NO Are there any questions regarding any aspect of care? ?Are there any questions regarding medications administration/dosing? NO Are meds being taken as prescribed? YES, has stopped taking Senna due to taking Miralax Patient should review meds with caller to confirm ?Have there been any falls? NO ?Has Home Health been to the house and/or have they contacted you? Patient is going to [facility] for Outpatient therapy If not, have you tried to contact them? Can we help you contact them? ?Are bowels and bladder emptying properly? Are there any unexpected incontinence issues? If applicable, is patient following bowel/bladder programs? ?Any fevers, problems with breathing, unexpected pain? NO ?Are there any skin problems or new areas of breakdown? NO ?Has the patient/family member arranged specialty MD follow up (ie cardiology/neurology/renal/surgical/etc)? YES  Can we help arrange? ?Does the patient need any other services or support that we can help arrange? NO ?Are caregivers following through as expected in assisting the patient? YES ?Has the patient quit smoking, drinking alcohol, or using drugs as recommended? YES ? ?Appointment time, arrive time and who it is with here 07/19/21 at 1:20 arrival at 1 pm with Danella Sensing, NP ?[address]   ?

## 2021-07-08 NOTE — Progress Notes (Signed)
LVAD INR 

## 2021-07-09 ENCOUNTER — Ambulatory Visit (HOSPITAL_COMMUNITY): Payer: Self-pay | Admitting: Pharmacist

## 2021-07-09 LAB — POCT INR: INR: 1.6 — AB (ref 2.0–3.0)

## 2021-07-09 NOTE — Progress Notes (Signed)
LVAD INR 

## 2021-07-12 ENCOUNTER — Other Ambulatory Visit (HOSPITAL_COMMUNITY): Payer: Self-pay | Admitting: *Deleted

## 2021-07-12 ENCOUNTER — Ambulatory Visit (HOSPITAL_COMMUNITY): Payer: Self-pay | Admitting: Pharmacist

## 2021-07-12 DIAGNOSIS — G8929 Other chronic pain: Secondary | ICD-10-CM

## 2021-07-12 DIAGNOSIS — Z95811 Presence of heart assist device: Secondary | ICD-10-CM

## 2021-07-12 LAB — POCT INR: INR: 1.4 — AB (ref 2.0–3.0)

## 2021-07-12 MED ORDER — TRAMADOL HCL 100 MG PO TABS: 100.0000 mg | ORAL_TABLET | Freq: Two times a day (BID) | ORAL | 0 refills | Status: DC | PRN

## 2021-07-12 NOTE — Progress Notes (Addendum)
Received call from patient's wife stating patient having break through headache pain in between Topamax BID doses. Reports he was crying this afternoon because his head is hurting so much. Rates pain 5.5/10 currently (which is unchanged from headaches reported in the hospital.) Discussed this with Dr Haroldine Laws. Order received for Tramadol 100 mg BID PRN with 0 refills.  ? ?Discussed the above with [name]. Tramadol called in to [place] per her request.  ? ?Addendum: Received call from patient's wife that [facility] called her and said they do not carry Tramadol 100 mg tablets, and need authorization to fill prescription with 50 mg tablets. I spoke with Nicholos Johns at [facility] and provided authorization for 1 month supply to be filled with 50 mg tablets.  ? ?Emerson Monte RN ?VAD Coordinator  ?Office: [phone]  ?24/7 Pager: [phone]  ? ?

## 2021-07-12 NOTE — Progress Notes (Signed)
LVAD INR 

## 2021-07-19 ENCOUNTER — Other Ambulatory Visit: Payer: Self-pay

## 2021-07-19 ENCOUNTER — Encounter: Payer: Medicare HMO | Admitting: Registered Nurse

## 2021-07-19 ENCOUNTER — Ambulatory Visit (HOSPITAL_COMMUNITY): Payer: Self-pay | Admitting: Pharmacist

## 2021-07-19 ENCOUNTER — Encounter: Payer: Medicare HMO | Attending: Registered Nurse | Admitting: Registered Nurse

## 2021-07-19 VITALS — BP 93/63 | HR 80 | Ht 65.0 in | Wt 170.0 lb

## 2021-07-19 DIAGNOSIS — I5022 Chronic systolic (congestive) heart failure: Secondary | ICD-10-CM | POA: Diagnosis not present

## 2021-07-19 DIAGNOSIS — I612 Nontraumatic intracerebral hemorrhage in hemisphere, unspecified: Secondary | ICD-10-CM

## 2021-07-19 DIAGNOSIS — Z95811 Presence of heart assist device: Secondary | ICD-10-CM

## 2021-07-19 DIAGNOSIS — I480 Paroxysmal atrial fibrillation: Secondary | ICD-10-CM | POA: Diagnosis present

## 2021-07-19 LAB — POCT INR: INR: 1.4 — AB (ref 2.0–3.0)

## 2021-07-19 NOTE — Progress Notes (Signed)
LVAD INR 

## 2021-07-19 NOTE — Progress Notes (Signed)
? ?Subjective:  ? ? Patient ID: David [name], male    DOB: Oct 05, 1965, 56 y.o.   MRN: [id number] ? ?HPI: David [name] is a 56 y.o. male who is here for Transitional Care Visit for follow up of his ICH ( Intracerebal hemorrhage, LVAD ( left ventricular assist device) Chronic Systolic Heart Failure and PAF ( Paroxysmal atrial fibrillation. David [name] was brought to [facility] on 06/05/2021 for altered mental status.  ?Dr Regenia Skeeter H&P on 06/05/2021 ?HPI ?56 year old male presents with neck pain and headache.  History is primarily from the wife and somewhat from the daughter at the bedside.  2 nights ago he started developing some posterior neck pain that he states felt like he slept on it wrong.  It has progressively worsened and last night he was complaining of a going into his head and having an occipital headache.  Around 11 PM or midnight last night he was going to bed but seemed a little off balance but was able to go to bed and hookup his LVAD.  However this morning he has developed vomiting and seems to have worsening weakness and confusion.  Wife felt like he was disoriented.  Seem like both of his legs were weak and she has not noticed any unilateral weakness. No trauma. ?  ?Patient is sleepy and is able to awaken talk to me but seems confused and is unable to significantly participate in history. ? ?CT Head: WO Contrast ?IMPRESSION: ?Acute hemorrhage originating at the right caudothalamic groove with ?intraventricular clot in the right lateral ventricle to fourth ?ventricle. Obstructive changes at the right lateral ventricle which ?is asymmetrically ballooned. ? ?CT: Head WO Contrast: EVD: Placed by Dr Arnoldo Morale ?Other: EVD in place.  No acute orbit or scalp soft tissue finding. ?  ?IMPRESSION: ?1. Stable right deep gray matter hemorrhage (12 mL) since ?06/06/2011. Mild regional edema, no significant intracranial mass ?effect. ?  ?2. Stable right frontal approach EVD. Interval decreased IVH,  and ?satisfactory ventricle size. ?  ?3. No new intracranial abnormality. 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 ? ?David [name] was admitted to inpatient Rehabilitation on 06/28/2021 and discharged home on 07/06/2021 ?David [name] reports he has constant headache, Dr Haroldine Laws prescribed Tramadol. He rates his pain 5. He is receiving outpatient therapy at [facility] Also reports his appetite is fair.  ? ?Pain Inventory ?Average Pain 5 ?Pain Right Now 5 ?My pain is sharp and aching ? ?LOCATION OF PAIN  head, neck ? ?BOWEL ?Number of stools per week: 14 ?Oral laxative use No  ?Type of laxative na ?Enema or suppository use No  ?History of colostomy No  ?Incontinent No  ? ?BLADDER ?Normal ?In and out cath, frequency na ?Able to self cath  na ?Bladder incontinence No  ?Frequent urination No  ?Leakage with coughing No  ?Difficulty starting stream No  ?Incomplete bladder emptying No  ? ? ?Mobility ?walk without assistance ?how many minutes can you walk? 10 ?ability to climb steps?  yes ?do you drive?  no ? ?Function ?disabled: date disabled . ? ?Neuro/Psych ?weakness ?confusion ? ?Prior Studies ?TC appt ? ?Physicians involved in your care ?TC appt ? ? ?Family History  ?Problem Relation Age of Onset  ? Coronary artery disease Father   ? Hypertension Father   ? Hypertension Sister   ? Coronary artery disease Brother   ? Emphysema Brother   ? Coronary artery disease Mother   ? Hypertension Mother   ?  Emphysema Mother   ? Hypertension Daughter   ? Obesity Daughter   ? Diabetes Other   ? Hypertension Other   ? Coronary artery disease Other   ? ?Social History  ? ?Socioeconomic History  ? Marital status: Married  ?  Spouse name: [name]  ? Number of children: 3  ? Years of education: Not on file  ? Highest education level: Not on file  ?Occupational History  ? Not on file  ?Tobacco Use  ? Smoking status: Former  ?  Packs/day: 0.50  ?  Years: 43.00  ?   Pack years: 21.50  ?  Types: Cigarettes, Cigars  ?  Quit date: 04/29/2016  ?  Years since quitting: 5.2  ? Smokeless tobacco: Never  ?Vaping Use  ? Vaping Use: Former  ? Quit date: 04/29/2016  ?Substance and Sexual Activity  ? Alcohol use: No  ? Drug use: No  ? Sexual activity: Yes  ?Other Topics Concern  ? Not on file  ?Social History Narrative  ? Not on file  ? ?Social Determinants of Health  ? ?Financial Resource Strain: Not on file  ?Food Insecurity: Not on file  ?Transportation Needs: Not on file  ?Physical Activity: Not on file  ?Stress: Not on file  ?Social Connections: Not on file  ? ?Past Surgical History:  ?Procedure Laterality Date  ? AORTIC VALVE REPAIR N/A 04/20/2017  ? Procedure: AORTIC VALVE REPAIR;  Surgeon: Gaye Pollack, MD;  Location: [facility];  Service: Open Heart Surgery;  Laterality: N/A;  ? BIV ICD INSERTION CRT-D N/A 08/01/2016  ? Procedure: BiV ICD Insertion CRT-D;  Surgeon: Will Meredith Leeds, MD;  Location: [facility] CV LAB;  Service: Cardiovascular;  Laterality: N/A;  ? CARDIAC CATHETERIZATION N/A 05/05/2016  ? Procedure: Right/Left Heart Cath and Coronary Angiography;  Surgeon: Jolaine Artist, MD;  Location: [place] CV LAB;  Service: Cardiovascular;  Laterality: N/A;  ? CARDIAC CATHETERIZATION N/A 05/09/2016  ? Procedure: Coronary Stent Intervention;  Surgeon: Peter M Martinique, MD;  Location: [place] CV LAB;  Service: Cardiovascular;  Laterality: N/A;  ? CARDIAC CATHETERIZATION N/A 05/09/2016  ? Procedure: Intravascular Pressure Wire/FFR Study;  Surgeon: Peter M Martinique, MD;  Location: [place] CV LAB;  Service: Cardiovascular;  Laterality: N/A;  ? CARDIOVERSION N/A 10/17/2018  ? Procedure: CARDIOVERSION;  Surgeon: Jolaine Artist, MD;  Location: [facility] ENDOSCOPY;  Service: Cardiovascular;  Laterality: N/A;  ? CORONARY ANGIOPLASTY WITH STENT PLACEMENT    ? "i've got a total of 12 stents" (04/19/2017)  ? ELECTROPHYSIOLOGY STUDY N/A 06/24/2016  ? Procedure: Electrophysiology Study;   Surgeon: Deboraha Sprang, MD;  Location: [place] CV LAB;  Service: Cardiovascular;  Laterality: N/A;  ? ENTEROSCOPY N/A 06/03/2017  ? Procedure: ENTEROSCOPY;  Surgeon: Yetta Flock, MD;  Location: [facility] ENDOSCOPY;  Service: Gastroenterology;  Laterality: N/A;  ? EPICARDIAL PACING LEAD PLACEMENT Left 11/07/2016  ? Procedure: LV EPICARDIAL PACING LEAD PLACEMENT VIA LEFT MINI THORACOTOMY;  Surgeon: Gaye Pollack, MD;  Location: [facility];  Service: Thoracic;  Laterality: Left;  ? ICD IMPLANT N/A 06/24/2016  ? Procedure: POSSIBLE  ICD Implant;  Surgeon: Deboraha Sprang, MD;  Location: [place] CV LAB;  Service: Cardiovascular;  Laterality: N/A;  ? INSERTION OF IMPLANTABLE LEFT VENTRICULAR ASSIST DEVICE N/A 04/20/2017  ? Procedure: INSERTION OF IMPLANTABLE LEFT VENTRICULAR ASSIST DEVICE, Aortic Valve repair;  Surgeon: Gaye Pollack, MD;  Location: [facility] OR;  Service: Open Heart Surgery;  Laterality: N/A;  Heartmate 3  ?  IR FLUORO GUIDE CV LINE RIGHT  05/08/2017  ? IR FLUORO GUIDE CV MIDLINE PICC RIGHT  03/14/2017  ? IR REMOVAL TUN CV CATH W/O FL  05/25/2017  ? IR US GUIDE VASC ACCESS RIGHT  03/14/2017  ? IR US GUIDE VASC ACCESS RIGHT  05/08/2017  ? LEFT HEART CATHETERIZATION WITH CORONARY ANGIOGRAM N/A 03/13/2011  ? Procedure: LEFT HEART CATHETERIZATION WITH CORONARY ANGIOGRAM;  Surgeon: Lorretta Harp, MD;  Location: [facility] CATH LAB;  Service: Cardiovascular;  Laterality: N/A;  ? MULTIPLE EXTRACTIONS WITH ALVEOLOPLASTY N/A 04/12/2017  ? Procedure: Extraction of tooth #'s 2, 5-12, 17, 20-29 with alveoloplasty amd maxillary right buccal exostoses reductions.;  Surgeon: Lenn Cal, DDS;  Location: [place] [facility];  Service: Oral Surgery;  Laterality: N/A;  ? RIGHT HEART CATH N/A 04/19/2017  ? Procedure: RIGHT HEART CATH;  Surgeon: Jolaine Artist, MD;  Location: [place] CV LAB;  Service: Cardiovascular;  Laterality: N/A;  ? RIGHT/LEFT HEART CATH AND CORONARY ANGIOGRAPHY N/A 03/10/2017  ? Procedure: RIGHT/LEFT HEART CATH  AND CORONARY ANGIOGRAPHY;  Surgeon: Jolaine Artist, MD;  Location: [place] CV LAB;  Service: Cardiovascular;  Laterality: N/A;  ? TEE WITHOUT CARDIOVERSION N/A 04/20/2017  ? Procedure: TRANSESOPHAGEAL [facility]

## 2021-07-20 ENCOUNTER — Encounter (HOSPITAL_COMMUNITY): Payer: Medicare HMO

## 2021-07-20 ENCOUNTER — Other Ambulatory Visit (HOSPITAL_COMMUNITY): Payer: Self-pay | Admitting: *Deleted

## 2021-07-20 DIAGNOSIS — Z95811 Presence of heart assist device: Secondary | ICD-10-CM

## 2021-07-20 DIAGNOSIS — Z7901 Long term (current) use of anticoagulants: Secondary | ICD-10-CM

## 2021-07-23 ENCOUNTER — Encounter (HOSPITAL_COMMUNITY): Payer: Medicare HMO

## 2021-07-26 ENCOUNTER — Ambulatory Visit (HOSPITAL_COMMUNITY): Payer: Self-pay | Admitting: Pharmacist

## 2021-07-26 LAB — POCT INR: INR: 1.2 — AB (ref 2.0–3.0)

## 2021-07-26 NOTE — Progress Notes (Signed)
LVAD INR 

## 2021-07-27 ENCOUNTER — Encounter: Payer: Self-pay | Admitting: Registered Nurse

## 2021-07-28 ENCOUNTER — Ambulatory Visit (INDEPENDENT_AMBULATORY_CARE_PROVIDER_SITE_OTHER): Payer: Medicare HMO

## 2021-07-28 DIAGNOSIS — I255 Ischemic cardiomyopathy: Secondary | ICD-10-CM

## 2021-07-28 LAB — CUP PACEART REMOTE DEVICE CHECK
Battery Remaining Longevity: 42 mo
Battery Voltage: 2.96 V
Brady Statistic AP VP Percent: 0 %
Brady Statistic AP VS Percent: 0 %
Brady Statistic AS VP Percent: 0.03 %
Brady Statistic AS VS Percent: 99.97 %
Brady Statistic RA Percent Paced: 0 %
Brady Statistic RV Percent Paced: 0.03 %
Date Time Interrogation Session: 20230405063427
HighPow Impedance: 68 Ohm
Implantable Lead Implant Date: 20180409
Implantable Lead Implant Date: 20180409
Implantable Lead Implant Date: 20180716
Implantable Lead Implant Date: 20180716
Implantable Lead Location: 753858
Implantable Lead Location: 753858
Implantable Lead Location: 753859
Implantable Lead Location: 753860
Implantable Lead Model: 5076
Implantable Lead Model: 511212
Implantable Lead Model: 511212
Implantable Lead Serial Number: 263773
Implantable Lead Serial Number: 263775
Implantable Pulse Generator Implant Date: 20180409
Lead Channel Impedance Value: 247 Ohm
Lead Channel Impedance Value: 323 Ohm
Lead Channel Impedance Value: 342 Ohm
Lead Channel Impedance Value: 3534 Ohm
Lead Channel Impedance Value: 4047 Ohm
Lead Channel Impedance Value: 893 Ohm
Lead Channel Pacing Threshold Amplitude: 0.625 V
Lead Channel Pacing Threshold Amplitude: 1.25 V
Lead Channel Pacing Threshold Amplitude: 1.75 V
Lead Channel Pacing Threshold Pulse Width: 0.4 ms
Lead Channel Pacing Threshold Pulse Width: 0.4 ms
Lead Channel Pacing Threshold Pulse Width: 1 ms
Lead Channel Sensing Intrinsic Amplitude: 12.5 mV
Lead Channel Sensing Intrinsic Amplitude: 12.5 mV
Lead Channel Sensing Intrinsic Amplitude: 2.375 mV
Lead Channel Sensing Intrinsic Amplitude: 2.375 mV
Lead Channel Setting Pacing Amplitude: 2.5 V
Lead Channel Setting Pacing Pulse Width: 0.4 ms
Lead Channel Setting Sensing Sensitivity: 0.6 mV

## 2021-07-28 NOTE — Addendum Note (Signed)
Addended by: Tanda Rockers B on: 07/28/2021 03:51 PM ? ? Modules accepted: Orders ? ?

## 2021-07-30 ENCOUNTER — Telehealth (HOSPITAL_COMMUNITY): Payer: Self-pay | Admitting: *Deleted

## 2021-07-30 ENCOUNTER — Encounter (HOSPITAL_COMMUNITY): Payer: Medicare HMO

## 2021-07-30 NOTE — Telephone Encounter (Signed)
Received call from patient's wife [name] this morning stating the weather is terrible by them, and she does not feel safe driving [place] to clinic this morning. Rescheduled pt for follow up next week. Advised to call if pt needs to be seen sooner. She verbalized understanding.  ? ?Emerson Monte RN ?VAD Coordinator  ?Office: [phone]  ?24/7 Pager: [phone]  ? ?

## 2021-08-02 ENCOUNTER — Ambulatory Visit (HOSPITAL_COMMUNITY): Payer: Self-pay | Admitting: Pharmacist

## 2021-08-02 ENCOUNTER — Telehealth (HOSPITAL_COMMUNITY): Payer: Self-pay | Admitting: *Deleted

## 2021-08-02 ENCOUNTER — Other Ambulatory Visit (HOSPITAL_COMMUNITY): Payer: Self-pay | Admitting: *Deleted

## 2021-08-02 DIAGNOSIS — G8929 Other chronic pain: Secondary | ICD-10-CM

## 2021-08-02 DIAGNOSIS — I10 Essential (primary) hypertension: Secondary | ICD-10-CM

## 2021-08-02 DIAGNOSIS — Z95811 Presence of heart assist device: Secondary | ICD-10-CM

## 2021-08-02 LAB — POCT INR: INR: 1.3 — AB (ref 2.0–3.0)

## 2021-08-02 MED ORDER — TOPIRAMATE 50 MG PO TABS: 50.0000 mg | ORAL_TABLET | Freq: Two times a day (BID) | ORAL | 6 refills | Status: DC

## 2021-08-02 MED ORDER — TAMSULOSIN HCL 0.4 MG PO CAPS: 0.4000 mg | ORAL_CAPSULE | Freq: Every day | ORAL | 3 refills | Status: DC

## 2021-08-02 MED ORDER — LOSARTAN POTASSIUM 25 MG PO TABS: 25.0000 mg | ORAL_TABLET | Freq: Every day | ORAL | 3 refills | Status: DC

## 2021-08-02 NOTE — Telephone Encounter (Signed)
Called patient's wife and asked her to bring VAD equipment to clinic visit tomorrow for annual maintenance per [place]. She verbalized agreement to same. ? ?Zada Girt RN, VAD Coordinator ?[phone] ?

## 2021-08-02 NOTE — Progress Notes (Signed)
LVAD INR 

## 2021-08-03 ENCOUNTER — Encounter (HOSPITAL_COMMUNITY): Payer: Self-pay

## 2021-08-03 ENCOUNTER — Other Ambulatory Visit (HOSPITAL_COMMUNITY): Payer: Self-pay | Admitting: Internal Medicine

## 2021-08-03 ENCOUNTER — Ambulatory Visit (HOSPITAL_COMMUNITY)
Admission: RE | Admit: 2021-08-03 | Discharge: 2021-08-03 | Disposition: A | Discharge status: Home / Self Care | Payer: Medicare HMO | Source: Ambulatory Visit | Attending: Internal Medicine | Admitting: Internal Medicine

## 2021-08-03 VITALS — BP 137/77 | HR 81 | Temp 98.3°F | Ht 65.0 in | Wt 168.8 lb

## 2021-08-03 DIAGNOSIS — N1831 Chronic kidney disease, stage 3a: Secondary | ICD-10-CM | POA: Diagnosis not present

## 2021-08-03 DIAGNOSIS — Z79899 Other long term (current) drug therapy: Secondary | ICD-10-CM | POA: Insufficient documentation

## 2021-08-03 DIAGNOSIS — I130 Hypertensive heart and chronic kidney disease with heart failure and stage 1 through stage 4 chronic kidney disease, or unspecified chronic kidney disease: Secondary | ICD-10-CM | POA: Insufficient documentation

## 2021-08-03 DIAGNOSIS — Z95810 Presence of automatic (implantable) cardiac defibrillator: Secondary | ICD-10-CM | POA: Diagnosis not present

## 2021-08-03 DIAGNOSIS — Z95811 Presence of heart assist device: Secondary | ICD-10-CM | POA: Insufficient documentation

## 2021-08-03 DIAGNOSIS — Z955 Presence of coronary angioplasty implant and graft: Secondary | ICD-10-CM | POA: Diagnosis not present

## 2021-08-03 DIAGNOSIS — Z7901 Long term (current) use of anticoagulants: Secondary | ICD-10-CM | POA: Insufficient documentation

## 2021-08-03 DIAGNOSIS — I10 Essential (primary) hypertension: Secondary | ICD-10-CM | POA: Diagnosis not present

## 2021-08-03 DIAGNOSIS — Z8679 Personal history of other diseases of the circulatory system: Secondary | ICD-10-CM | POA: Diagnosis present

## 2021-08-03 DIAGNOSIS — I5022 Chronic systolic (congestive) heart failure: Secondary | ICD-10-CM | POA: Diagnosis not present

## 2021-08-03 DIAGNOSIS — I2510 Atherosclerotic heart disease of native coronary artery without angina pectoris: Secondary | ICD-10-CM | POA: Diagnosis not present

## 2021-08-03 DIAGNOSIS — I4892 Unspecified atrial flutter: Secondary | ICD-10-CM

## 2021-08-03 DIAGNOSIS — I609 Nontraumatic subarachnoid hemorrhage, unspecified: Secondary | ICD-10-CM

## 2021-08-03 DIAGNOSIS — E785 Hyperlipidemia, unspecified: Secondary | ICD-10-CM | POA: Diagnosis not present

## 2021-08-03 LAB — LACTATE DEHYDROGENASE: LDH: 169 U/L (ref 98–192)

## 2021-08-03 LAB — COMPREHENSIVE METABOLIC PANEL
ALT: 21 U/L (ref 0–44)
AST: 24 U/L (ref 15–41)
Albumin: 3.2 g/dL — ABNORMAL LOW (ref 3.5–5.0)
Alkaline Phosphatase: 107 U/L (ref 38–126)
Anion gap: 5 (ref 5–15)
BUN: 9 mg/dL (ref 6–20)
CO2: 22 mmol/L (ref 22–32)
Calcium: 8.9 mg/dL (ref 8.9–10.3)
Chloride: 112 mmol/L — ABNORMAL HIGH (ref 98–111)
Creatinine, Ser: 1.58 mg/dL — ABNORMAL HIGH (ref 0.61–1.24)
GFR, Estimated: 51 mL/min — ABNORMAL LOW (ref >60)
Glucose, Bld: 88 mg/dL (ref 70–99)
Potassium: 4 mmol/L (ref 3.5–5.1)
Sodium: 139 mmol/L (ref 135–145)
Total Bilirubin: 0.6 mg/dL (ref 0.3–1.2)
Total Protein: 7 g/dL (ref 6.5–8.1)

## 2021-08-03 LAB — CBC
HCT: 45.3 % (ref 39.0–52.0)
Hemoglobin: 14.3 g/dL (ref 13.0–17.0)
MCH: 26.7 pg (ref 26.0–34.0)
MCHC: 31.6 g/dL (ref 30.0–36.0)
MCV: 84.5 fL (ref 80.0–100.0)
Platelets: 199 10*3/uL (ref 150–400)
RBC: 5.36 MIL/uL (ref 4.22–5.81)
RDW: 15.5 % (ref 11.5–15.5)
WBC: 7.2 10*3/uL (ref 4.0–10.5)
nRBC: 0 % (ref 0.0–0.2)

## 2021-08-03 LAB — TSH: TSH: 2.076 u[IU]/mL (ref 0.350–4.500)

## 2021-08-03 LAB — T4, FREE: Free T4: 1.22 ng/dL — ABNORMAL HIGH (ref 0.61–1.12)

## 2021-08-03 LAB — MAGNESIUM: Magnesium: 2 mg/dL (ref 1.7–2.4)

## 2021-08-03 MED ORDER — LOSARTAN POTASSIUM 50 MG PO TABS: 50.0000 mg | ORAL_TABLET | Freq: Every day | ORAL | 3 refills | Status: DC

## 2021-08-03 NOTE — Patient Instructions (Signed)
Increase Losartan to 50 mg daily. ?Return in two weeks for BP check. ?Return in 2 months for full visit.  ?

## 2021-08-03 NOTE — Progress Notes (Signed)
CSW met with patient and wife in [facility]. Patient recently in the hospital for an extended time due to a stroke.  Patient's wife filled up with tears as patient spoke about how he is feeling good and shared stories from the past. CSW took wife for a walk while VAD Coordinator finished visit with patient. Wife shared that it has been very difficult to watch patient who is still struggling with memory issues and often gets his days and nights mixed up. She shared that she will often have to repeat herself multiple times and is unable to leave him home alone. She states that she has little support as most of her family work and have their own lives. CSW provided supportive intervention and discussed some options for her with coping strategies to manage the stress of caregiving. Wife appears overwhelmed with caregiving and the stress of the recovery from patient's stroke. She reports that the neurologist mentioned it may take up to a year for the full recovery. Patient will need to return to the clinic for a BP check in 2 weeks and wife stated that she has the financial burden as well as the emotional burden. CSW assisted with a gas card to lighten the load of the financial burden. Patient's wife was grateful for the support. CSW continues to follow for support and assistance as needed. Raquel Sarna, [place], [place] ? ?

## 2021-08-03 NOTE — Progress Notes (Signed)
Patient presents for hospital d/c follow up in [facility] today with David [name]) wife. Reports no problems or concerns with drive line. ? ?Patient says he has been feeling good since discharge. He denies any lightheadedness,  dizziness, falls, shortness of breath, and signs of bleeding.  ? ?BP elevated today, Dr. Haroldine Laws increased Losartan to 50 mg daily with BP check in two weeks. Attempted to use home BP check provided by David [name], did not work. Scheduled 2 week f/u for BP check here.  ? ?He does report continued headache 5/10 "all the time". Wife reports the topamax "works" which he is taking twice daily and the Tramadol works for "break through pain". She says he can have Tramadol twice daily, but only asks for it once daily most of the time.  ? ?Both report f/u with Neurology and they were told patient may have H/As for up to one year as head bleed resolves. ? ?Wife tearful and spoke at length with David Sarna, LCSW.  ? ?Vital Signs:  ?Temp: 98.3 ?Doppler Pressure: 94 ?Automatc BP: 137/77 (105) ?HR: 81 ?SPO2: UTO  ?  ?Weight: 168.8 lb w/o eqt ?Last weight: 170.6 lb ? ?VAD Indication: ?Destination therapy- CKD ?  ?VAD interrogation & Equipment Management: ?Speed: 5400 ?[name]: 4.4 ?Power: 4.2w    ?PI: 6.1 ?  ?Alarms: none ?Events: 5 - 10 PI events daily ? ?Fixed speed 5400 ?Low speed limit: 5100 ?  ?Primary Controller:  Replace back up battery in 31 months ?Back up controller:  Did not bring ?  ?Annual Equipment Maintenance on UBC/PM was performed on 08/03/21. ?  ?I reviewed the LVAD parameters from today and compared the results to the patient's prior recorded data. LVAD interrogation was NEGATIVE for significant power changes, negative for clinical alarms and STABLE for PI events/speed drops. No programming changes were made and pump is functioning within specified parameters. Pt is performing daily controller and system monitor self tests along with completing weekly and monthly maintenance for LVAD  equipment. ?  ?LVAD equipment check completed and is in good working order. Back up equipment not present.   ?  ?Exit Site Care: ?Drive line is being maintained weekly by David [name], his wife. Exit site healed and incorporated, the velour is fully implanted at exit site. No redness, drainage or foul odor noted. Pt denies fever or chills. Pt given 8 weekly dressings for home use and 5 anchors. ? ?Device: [employer] Bi-V ?Therapies: on 167 ?Pacing:  VVI 50 ?Last check:  07/28/21 ? ? ?BP & Labs:  ?Doppler BP 94 - Doppler is reflecting MAP ?  ?Hgb 14.3 - No S/S of bleeding. Specifically denies melena/BRBPR or nosebleeds. ? ?LDH stable at 169 with established baseline of 165- 250. Denies tea-colored urine. No power elevations noted on interrogation.   ? ?Annual maintenance completed per [employer] on patient?s home power module; patient did not bring Charity fundraiser.    ? ? ?Patient Instructions:  ?Increase Losartan to 50 mg daily. ?Return in two weeks for BP check. ?Return in 2 months for full visit.  ? ? ?Zada Girt RN ?VAD Coordinator  ?Office: [phone]  ?24/7 Pager: [phone]  ? ? ? ? ? ? ? ?

## 2021-08-04 NOTE — Progress Notes (Signed)
? ?LVAD Clinic Follow Up Note ?HF MD: Dr Haroldine Laws ? ?HPI: ?David [name] is a 56 y.o. male with history of ICD (09 PCI to LAD, mRCA, '10 PCI to Lcx and '12 mRCA), HFrEF (EF ~20% for > 5 years), HLD and HTN. S/P LVAD HM-III 04/20/18.  ?  ?Admitted 04/19/17 - 05/27/2017 for scheduled LVAD implant. On 12/27 he underwent HMIII LVAD implant. Hospital course c/b PNA shock liver and AKI. Required short term HD. ? ?Readmitted 05/30/17 hgb 3.8. INR 2.9.transfused 4u pRBCs. 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 Underwent ventriculostomy. Admission c/b by periods of confusion and urinary retention. Discharged to CIR ? ?Returns for post-hospital with his wife. Feels great. Still with mild smoldering HA. Has seen Neurology who told him it may take several months to resolve. Denies any other focal Neuro symptoms.Denies orthopnea or PND. No fevers, chills or problems with driveline. No bleeding or melena. No VAD alarms. Taking all meds as prescribed.  ?  ?VAD Indication: ?Destination therapy- CKD ?  ?VAD interrogation & Equipment Management: ?Speed: 5400 ?Flow: 4.4 ?Power: 4.2w    ?PI: 6.1 ?  ?Alarms: none ?Events: 5 - 10 PI events daily ? ?Fixed speed 5400 ?Low speed limit: 5100 ?  ?Primary Controller:  Replace back up battery in 31 months ?Back up controller:  Did not bring ?  ?Annual Equipment Maintenance on UBC/PM was performed on 08/03/21. ?  ?I reviewed the LVAD parameters from today and compared the results to the patient's prior recorded data. LVAD interrogation was NEGATIVE for significant power changes, negative for clinical alarms and STABLE for PI events/speed drops. No programming changes were made and pump is functioning within specified parameters. Pt is performing daily controller and system monitor self tests along with completing weekly and monthly maintenance for LVAD  equipment. ?  ?LVAD equipment check completed and is in good working order. Back up equipment not present ?  ?Exit Site Care: ?Drive line is being maintained weekly by [name], his wife. Exit site healed and incorporated, the velour is fully implanted at exit site. No redness, drainage or foul odor noted. Pt denies fever or chills. Pt given 8 weekly dressings, 10 anchors and adhesive remover wipes for home use.  ? ? ?Past Medical History:  ?Diagnosis Date  ? AICD (automatic cardioverter/defibrillator) present   ? Anemia   ? Anxiety   ? CAD (coronary artery disease) 2009  ? AMI in 12/2007 with PCI to LAD, staged PCI to  mid/distal RCA, NSTEMI in 02/2009 with BMS to LCx  ? CHF (congestive heart failure) ([place])   ? Chronic kidney disease 11/03/2016  ? stage 3 kidney disease  ? Dysrhythmia   ? "arrythmia problems at some point", "fatal rhythms"  ? History of blood transfusion   ? "I was bleeding from was rectum" (04/19/2017)  ? HLD (hyperlipidemia)   ? HTN (hypertension)   ? Hyperlipidemia 03/15/2011  ? Ischemic cardiomyopathy   ? Admitted in 07/2010 with CHF exacerbation  ? Myocardial infarction [facility])   ? "I've had 4" (04/19/2017)  ? Pneumonia 2018 X 1  ? Seizures ([place])   ? one seizure in 04/2016 during cardiac event (04/19/2017)  ? STEMI 2009 (anterior), 2010 (lateral), and 2012 (inferior) 03/14/2011  ? ? ?Current Outpatient Medications  ?Medication Sig Dispense Refill  ? acetaminophen (TYLENOL) 325 MG tablet Take 2  tablets (650 mg total) by mouth every 4 (four) hours as needed for headache or mild pain.    ? amiodarone (PACERONE) 200 MG tablet Take 1 tablet (200 mg total) by mouth daily. 30 tablet 5  ? atorvastatin (LIPITOR) 40 MG tablet Take 1 tablet (40 mg total) by mouth daily. 90 tablet 0  ? magnesium oxide (MAG-OX) 400 (240 Mg) MG tablet Take 1 tablet (400 mg total) by mouth 2 (two) times daily. 60 tablet 0  ? Mouthwashes (MOUTH RINSE) LIQD solution 15 mLs by Mouth Rinse route 2 (two) times daily.  0  ? Multiple  Vitamin (MULTIVITAMIN WITH MINERALS) TABS tablet Take 1 tablet by mouth daily. 30 tablet 0  ? pantoprazole (PROTONIX) 40 MG tablet Take 1 tablet (40 mg total) by mouth daily. 90 tablet 3  ? polyethylene glycol (MIRALAX / GLYCOLAX) 17 g packet Take 17 g by mouth daily. 30 each 0  ? polyvinyl alcohol (LIQUIFILM TEARS) 1.4 % ophthalmic solution Place 2 drops into both eyes as needed for dry eyes. 15 mL 0  ? senna (SENOKOT) 8.6 MG TABS tablet Take 2 tablets (17.2 mg total) by mouth at bedtime. 120 tablet 0  ? tamsulosin (FLOMAX) 0.4 MG CAPS capsule Take 1 capsule (0.4 mg total) by mouth daily. 90 capsule 3  ? topiramate (TOPAMAX) 50 MG tablet Take 1 tablet (50 mg total) by mouth 2 (two) times daily. 60 tablet 6  ? traMADol HCl 100 MG TABS Take 100 mg by mouth every 12 (twelve) hours as needed. 60 tablet 0  ? traZODone (DESYREL) 50 MG tablet TAKE 1 TABLET BY MOUTH ONCE DAILY AT BEDTIME (Patient taking differently: Take 50 mg by mouth at bedtime.) 30 tablet 6  ? warfarin (COUMADIN) 2 MG tablet Take 0.5 tablets (1 mg total) by mouth daily at 4 PM. Start this tomorrow evening 30 tablet 0  ? losartan (COZAAR) 50 MG tablet Take 1 tablet (50 mg total) by mouth daily. 90 tablet 3  ? ?No current facility-administered medications for this encounter.  ? ? ?Plavix [clopidogrel bisulfate] ? ?Review of systems complete and found to be negative unless listed in HPI.   ?  ? ?Vitals:  ? 08/03/21 1110 08/03/21 1111  ?BP: (!) 94/0 137/77  ?Pulse:  81  ?Temp:  98.3 ?F (36.8 ?C)  ? ? ?Wt Readings from Last 3 Encounters:  ?08/03/21 76.6 kg (168 lb 12.8 oz)  ?07/19/21 77.1 kg (170 lb)  ?07/06/21 76 kg (167 lb 8.8 oz)  ? ? ?Vital Signs:  ?Temp: 98.3 ?Doppler Pressure: 94 ?Automatc BP: 137/77 (105) ?HR: 81 ?SPO2: UTO  ?  ?Weight: 168.8 lb w/o eqt ?Last weight: 170.6 lb ?  ?VAD Indication: ?Destination therapy- CKD ?  ?VAD interrogation & Equipment Management: ?Speed: 5400 ?Flow: 4.4 ?Power: 4.2w    ?PI: 6.1 ?  ?Alarms: none ?Events: 5 - 10 PI  events daily ? ?Fixed speed 5400 ?Low speed limit: 5100 ?  ?Primary Controller:  Replace back up battery in 31 months ?Back up controller:  Did not bring ?  ?Annual Equipment Maintenance on UBC/PM was performed on 08/03/21. ?  ?I reviewed the LVAD parameters from today and compared the results to the patient's prior recorded data. LVAD interrogation was NEGATIVE for significant power changes, negative for clinical alarms and STABLE for PI events/speed drops. No programming changes were made and pump is functioning within specified parameters. Pt is performing daily controller and system monitor self tests along with completing weekly and monthly maintenance  for LVAD equipment. ?  ?LVAD equipment check completed and is in good working order. Back up equipment not present ?  ? ?Physical Exam: ?General:  NAD.  ?HEENT: normal  ?Neck: supple. JVP not elevated.  Carotids 2+ bilat; no bruits. No lymphadenopathy or thryomegaly appreciated. ?Cor: LVAD hum.  ?Lungs: Clear. ?Abdomen: soft, nontender, non-distended. No hepatosplenomegaly. No bruits or masses. Good bowel sounds. Driveline site clean. Anchor in place.  ?Extremities: no cyanosis, clubbing, rash. Warm no edema  ?Neuro: alert & oriented x 3. No focal deficits. Moves all 4 without problem  ? ?ASSESSMENT AND PLAN: ? ?1. SAH ?- admit in 2/23 with [facility] requiring ventriculostomy ?- now improved ?- has residual HA. Following with Neuro. Continue tramadol ?- Needs tighter BP control. MAP 70-80s.  ?- Increase losartan to 50 daily. BP check in 2 weeks ? ?2. Chronic systolic CHF with HM III LVAD implant 04/20/17:  ?- Overall doing well with VAD support. ?- NYHA I  ?- Volume status ok ?- He has been seen at [facility] for transplant referral but has decided not to pursue VAD. No change ? ?3. AFL with [facility] ?- had DC-CV on 10/17/18 with subsequent short episodes of AF after but has been mostly in NSR on low-dose amio (100 daily) ?- Had recurrent AF in 10/22. Amio increased to 200 bid and  converted.  ?- Now in NSR. Continue amio 200 daily ?  ?4. h/o UGI AVM bleed ?- s/p APC x 4 lesions in 2/19 ?- No recurrent bleeding  ?- Continue PPI ?- Off ASA due to GIB.  ?- Hgb stable at 14.3 ? ?5. CK

## 2021-08-06 ENCOUNTER — Encounter (HOSPITAL_COMMUNITY): Payer: Medicare HMO

## 2021-08-09 ENCOUNTER — Ambulatory Visit (HOSPITAL_COMMUNITY): Payer: Self-pay | Admitting: Pharmacist

## 2021-08-09 LAB — POCT INR: INR: 1.8 — AB (ref 2.0–3.0)

## 2021-08-09 MED ORDER — WARFARIN SODIUM 2 MG PO TABS: ORAL_TABLET | ORAL | 11 refills | Status: DC

## 2021-08-09 NOTE — Progress Notes (Signed)
LVAD INR 

## 2021-08-12 NOTE — Progress Notes (Signed)
Remote ICD transmission.   

## 2021-08-16 ENCOUNTER — Ambulatory Visit (HOSPITAL_COMMUNITY)
Admission: RE | Admit: 2021-08-16 | Discharge: 2021-08-16 | Disposition: A | Discharge status: Home / Self Care | Payer: Medicare HMO | Source: Ambulatory Visit | Attending: Cardiology | Admitting: Cardiology

## 2021-08-16 ENCOUNTER — Encounter (HOSPITAL_COMMUNITY): Payer: Self-pay

## 2021-08-16 ENCOUNTER — Ambulatory Visit (HOSPITAL_COMMUNITY): Payer: Self-pay | Admitting: Pharmacist

## 2021-08-16 VITALS — BP 86/0 | Wt 169.8 lb

## 2021-08-16 DIAGNOSIS — Z95811 Presence of heart assist device: Secondary | ICD-10-CM | POA: Insufficient documentation

## 2021-08-16 LAB — BASIC METABOLIC PANEL
Anion gap: 6 (ref 5–15)
BUN: 6 mg/dL (ref 6–20)
CO2: 23 mmol/L (ref 22–32)
Calcium: 8.7 mg/dL — ABNORMAL LOW (ref 8.9–10.3)
Chloride: 110 mmol/L (ref 98–111)
Creatinine, Ser: 1.52 mg/dL — ABNORMAL HIGH (ref 0.61–1.24)
GFR, Estimated: 53 mL/min — ABNORMAL LOW (ref >60)
Glucose, Bld: 85 mg/dL (ref 70–99)
Potassium: 4.1 mmol/L (ref 3.5–5.1)
Sodium: 139 mmol/L (ref 135–145)

## 2021-08-16 LAB — POCT INR: INR: 1.3 — AB (ref 2.0–3.0)

## 2021-08-16 NOTE — Progress Notes (Signed)
LVAD INR 

## 2021-08-16 NOTE — Patient Instructions (Signed)
No medication changes today ?Coumadin dosing per Ander Purpura PharmD ?Return to [facility] for your previously scheduled appointment ?

## 2021-08-16 NOTE — Progress Notes (Signed)
Patient presents for BP check in [facility] today with David [name]) wife. Reports no problems or concerns with drive line. ? ?Patient says he has been feeling good since last appt. He denies any lightheadedness,  dizziness, falls, shortness of breath, and signs of bleeding. Reports 5/10 headache that is managed with Topamax and PRN Tramadol.  ? ?Reports he is taking Losartan 50 mg daily as ordered last clinic visit. BP stable today. Orthostatic vitals as below. Discussed BP with Dr Haroldine Laws. No medication changes at this time. Will check BMET today with medication increase last visit.  ? ?Vital Signs:  ?Temp:  ?Doppler Pressure: 86 ?Automatc BP: ? Laying: 94/61 (72) HR 81 ? Laying (repeat): 105/86 (94) HR 81 ? Sitting: 104/89 (95) HR 80 ? Standing: 114/94 (101) HR 80  ? ?HR: 81 ?SPO2: 97% on RA ?  ?Weight: 169.8 lb w/o eqt ?Last weight: 168.8 lb ? ?Patient Instructions:  ?No medication changes today ?Coumadin dosing per Ander Purpura PharmD ?Return to [facility] for your previously scheduled appointment ? ?Emerson Monte RN ?VAD Coordinator  ?Office: [phone]  ?24/7 Pager: [phone]  ? ? ? ? ? ? ? ? ?

## 2021-08-20 ENCOUNTER — Encounter (HOSPITAL_COMMUNITY): Payer: Self-pay | Admitting: *Deleted

## 2021-08-23 ENCOUNTER — Ambulatory Visit (HOSPITAL_COMMUNITY): Payer: Self-pay | Admitting: Pharmacist

## 2021-08-23 LAB — POCT INR: INR: 1.6 — AB (ref 2.0–3.0)

## 2021-08-23 NOTE — Progress Notes (Signed)
LVAD INR 

## 2021-08-30 ENCOUNTER — Ambulatory Visit (HOSPITAL_COMMUNITY): Payer: Self-pay | Admitting: Pharmacist

## 2021-08-30 ENCOUNTER — Telehealth (HOSPITAL_COMMUNITY): Payer: Self-pay | Admitting: Licensed Clinical Social Worker

## 2021-08-30 LAB — POCT INR: INR: 1.5 — AB (ref 2.0–3.0)

## 2021-08-30 NOTE — Telephone Encounter (Signed)
CSW received call form patient's wife stating they are struggling with payment of utility bill. Wife shared that their food stamps have been reduced to $23 a month since the pandemic and their rent is climbing every few months. Patient's wife was very tearful on the phone and shared she has attempted to get some assistance with the county social services but there is paperwork that needs to be completed and she is already behind on the [employer]. CSW discussed the guidelines of the Patient Care Fund and some assistance that could be provided. CSW assisted with payment of the [employer] bill and will explore some assistance with rent to take some of the current financial burden off of the wife. CSW will mail documents needed for financial assistance with the [employer] [place] and have wife return asap. Wife grateful for the support and assistance. CSW will continue to follow for further assistance as needed. Raquel Sarna, [place], [place] ? ?

## 2021-08-30 NOTE — Progress Notes (Signed)
LVAD INR 

## 2021-09-06 ENCOUNTER — Ambulatory Visit (HOSPITAL_COMMUNITY): Payer: Self-pay | Admitting: Pharmacist

## 2021-09-06 ENCOUNTER — Telehealth (HOSPITAL_COMMUNITY): Payer: Self-pay | Admitting: Licensed Clinical Social Worker

## 2021-09-06 LAB — POCT INR: INR: 2.3 (ref 2.0–3.0)

## 2021-09-06 NOTE — Progress Notes (Signed)
LVAD INR 

## 2021-09-06 NOTE — Progress Notes (Signed)
CSW received call from patient's wife stating she was faxing over the documents for assistance for their rent through the [employer] [place]. Wife also shared that they are short on food this month and asked for some assistance with gift cards. CSW submitted check request and will mail gift cards for food assistance. Patient's wife grateful for the support and assistance. CSW available as needed. Raquel Sarna, [place], [place] ? ?

## 2021-09-09 ENCOUNTER — Encounter: Payer: Medicare HMO | Admitting: Physical Medicine & Rehabilitation

## 2021-09-10 ENCOUNTER — Telehealth (HOSPITAL_COMMUNITY): Payer: Self-pay | Admitting: *Deleted

## 2021-09-10 DIAGNOSIS — R059 Cough, unspecified: Secondary | ICD-10-CM

## 2021-09-10 DIAGNOSIS — J3489 Other specified disorders of nose and nasal sinuses: Secondary | ICD-10-CM

## 2021-09-10 MED ORDER — AMOXICILLIN-POT CLAVULANATE 875-125 MG PO TABS: 1.0000 | ORAL_TABLET | Freq: Two times a day (BID) | ORAL | 0 refills | Status: DC

## 2021-09-10 NOTE — Telephone Encounter (Signed)
David [name] (pt wife) called to report patient has been having head congestion with drainage and cough. She says this has happened in the past and Dr. Haroldine Laws gave an antibiotic which cleared his symptoms. She does not remember name of antibiotic.  Updated Dr. Haroldine Laws with Rx sent to local pharmacy of Augmentin 875 mg twice daily for 7 days. Aubery Lapping he may need to monitor his INR more closely. She asked to speak with Audry Riles PharmD prior to starting medication. I will ask Lauren to contact pt/wife today.  Zada Girt RN, [place] Coordinator [phone]

## 2021-09-13 ENCOUNTER — Ambulatory Visit (HOSPITAL_COMMUNITY): Payer: Self-pay | Admitting: Pharmacist

## 2021-09-13 LAB — POCT INR: INR: 1.2 — AB (ref 2.0–3.0)

## 2021-09-13 NOTE — Progress Notes (Signed)
LVAD INR 

## 2021-09-18 ENCOUNTER — Other Ambulatory Visit (HOSPITAL_COMMUNITY): Payer: Self-pay | Admitting: Internal Medicine

## 2021-09-18 DIAGNOSIS — Z95811 Presence of heart assist device: Secondary | ICD-10-CM

## 2021-09-21 ENCOUNTER — Ambulatory Visit (HOSPITAL_COMMUNITY): Payer: Self-pay | Admitting: Pharmacist

## 2021-09-21 LAB — POCT INR: INR: 2 (ref 2.0–3.0)

## 2021-09-21 NOTE — Progress Notes (Signed)
LVAD INR 

## 2021-09-22 ENCOUNTER — Other Ambulatory Visit (HOSPITAL_COMMUNITY): Payer: Self-pay | Admitting: Unknown Physician Specialty

## 2021-09-22 DIAGNOSIS — Z95811 Presence of heart assist device: Secondary | ICD-10-CM

## 2021-09-22 DIAGNOSIS — G8929 Other chronic pain: Secondary | ICD-10-CM

## 2021-09-22 MED ORDER — TRAMADOL HCL 100 MG PO TABS: 100.0000 mg | ORAL_TABLET | Freq: Two times a day (BID) | ORAL | 0 refills | Status: DC | PRN

## 2021-09-22 NOTE — Telephone Encounter (Signed)
Received call from pts wife stating that he is still having severe head pain on and off and she is requesting a refill on his tramadol. D/w Dr Haroldine Laws. Tramadol refill called into local pharmacy.  Tanda Rockers RN, BSN VAD Coordinator 24/7 Pager [phone]

## 2021-09-27 ENCOUNTER — Ambulatory Visit (HOSPITAL_COMMUNITY): Payer: Self-pay | Admitting: Pharmacist

## 2021-09-27 LAB — POCT INR: INR: 1.9 — AB (ref 2.0–3.0)

## 2021-09-27 NOTE — Progress Notes (Signed)
LVAD INR 

## 2021-10-04 ENCOUNTER — Ambulatory Visit (HOSPITAL_COMMUNITY): Payer: Self-pay | Admitting: Pharmacist

## 2021-10-04 ENCOUNTER — Encounter (HOSPITAL_COMMUNITY): Payer: Self-pay | Admitting: Unknown Physician Specialty

## 2021-10-04 LAB — POCT INR: INR: 1.8 — AB (ref 2.0–3.0)

## 2021-10-04 NOTE — Progress Notes (Signed)
LVAD INR 

## 2021-10-07 ENCOUNTER — Telehealth (HOSPITAL_COMMUNITY): Payer: Self-pay | Admitting: Licensed Clinical Social Worker

## 2021-10-07 ENCOUNTER — Other Ambulatory Visit (HOSPITAL_COMMUNITY): Payer: Self-pay | Admitting: *Deleted

## 2021-10-07 DIAGNOSIS — R059 Cough, unspecified: Secondary | ICD-10-CM

## 2021-10-07 DIAGNOSIS — Z95811 Presence of heart assist device: Secondary | ICD-10-CM

## 2021-10-07 DIAGNOSIS — Z7901 Long term (current) use of anticoagulants: Secondary | ICD-10-CM

## 2021-10-07 DIAGNOSIS — Z79899 Other long term (current) drug therapy: Secondary | ICD-10-CM

## 2021-10-07 NOTE — Telephone Encounter (Signed)
Patient's wife called to "talk because I am on the edge". Wife shared ongoing frustrations with her current life and "my family have a life of their own". She stated that she feels alone and very tearful. She shared that she is trying to do it all for patient and sometimes it is overwhelming. She mentioned that she spoke with her pastor who plans to stop by this week for a visit. She denies any suicidal ideation and is current with her therapist. Wife appears to be depressed and overwhelmed with financial and caregiving responsibilities. CSW provided supportive listening and encouraged her to contact her therapist to move up her appointment. She verbalizes understanding and will return call to CSW if needed. Wife has CSW number for any needs. CSW will continue to follow and provide support services as needed. Raquel Sarna, [place], [place]

## 2021-10-11 ENCOUNTER — Ambulatory Visit (HOSPITAL_COMMUNITY)
Admission: RE | Admit: 2021-10-11 | Discharge: 2021-10-11 | Disposition: A | Discharge status: Home / Self Care | Payer: Medicare HMO | Source: Ambulatory Visit | Attending: Internal Medicine | Admitting: Internal Medicine

## 2021-10-11 ENCOUNTER — Ambulatory Visit (HOSPITAL_COMMUNITY): Payer: Self-pay | Admitting: Pharmacist

## 2021-10-11 VITALS — BP 110/0 | HR 77 | Ht 65.0 in | Wt 164.0 lb

## 2021-10-11 DIAGNOSIS — Z79899 Other long term (current) drug therapy: Secondary | ICD-10-CM | POA: Diagnosis not present

## 2021-10-11 DIAGNOSIS — E785 Hyperlipidemia, unspecified: Secondary | ICD-10-CM | POA: Diagnosis not present

## 2021-10-11 DIAGNOSIS — N1831 Chronic kidney disease, stage 3a: Secondary | ICD-10-CM | POA: Insufficient documentation

## 2021-10-11 DIAGNOSIS — R059 Cough, unspecified: Secondary | ICD-10-CM

## 2021-10-11 DIAGNOSIS — I4892 Unspecified atrial flutter: Secondary | ICD-10-CM | POA: Diagnosis not present

## 2021-10-11 DIAGNOSIS — I5022 Chronic systolic (congestive) heart failure: Secondary | ICD-10-CM | POA: Diagnosis not present

## 2021-10-11 DIAGNOSIS — Z955 Presence of coronary angioplasty implant and graft: Secondary | ICD-10-CM | POA: Insufficient documentation

## 2021-10-11 DIAGNOSIS — I2510 Atherosclerotic heart disease of native coronary artery without angina pectoris: Secondary | ICD-10-CM | POA: Diagnosis not present

## 2021-10-11 DIAGNOSIS — Z7901 Long term (current) use of anticoagulants: Secondary | ICD-10-CM | POA: Diagnosis not present

## 2021-10-11 DIAGNOSIS — Z95810 Presence of automatic (implantable) cardiac defibrillator: Secondary | ICD-10-CM | POA: Diagnosis not present

## 2021-10-11 DIAGNOSIS — I130 Hypertensive heart and chronic kidney disease with heart failure and stage 1 through stage 4 chronic kidney disease, or unspecified chronic kidney disease: Secondary | ICD-10-CM | POA: Insufficient documentation

## 2021-10-11 DIAGNOSIS — I609 Nontraumatic subarachnoid hemorrhage, unspecified: Secondary | ICD-10-CM

## 2021-10-11 DIAGNOSIS — Z8679 Personal history of other diseases of the circulatory system: Secondary | ICD-10-CM | POA: Insufficient documentation

## 2021-10-11 DIAGNOSIS — I10 Essential (primary) hypertension: Secondary | ICD-10-CM

## 2021-10-11 DIAGNOSIS — J3489 Other specified disorders of nose and nasal sinuses: Secondary | ICD-10-CM

## 2021-10-11 DIAGNOSIS — Z452 Encounter for adjustment and management of vascular access device: Secondary | ICD-10-CM | POA: Diagnosis not present

## 2021-10-11 DIAGNOSIS — Z95811 Presence of heart assist device: Secondary | ICD-10-CM

## 2021-10-11 DIAGNOSIS — R0982 Postnasal drip: Secondary | ICD-10-CM | POA: Diagnosis not present

## 2021-10-11 LAB — COMPREHENSIVE METABOLIC PANEL
ALT: 23 U/L (ref 0–44)
AST: 27 U/L (ref 15–41)
Albumin: 3.1 g/dL — ABNORMAL LOW (ref 3.5–5.0)
Alkaline Phosphatase: 96 U/L (ref 38–126)
Anion gap: 6 (ref 5–15)
BUN: 8 mg/dL (ref 6–20)
CO2: 21 mmol/L — ABNORMAL LOW (ref 22–32)
Calcium: 8.8 mg/dL — ABNORMAL LOW (ref 8.9–10.3)
Chloride: 114 mmol/L — ABNORMAL HIGH (ref 98–111)
Creatinine, Ser: 1.43 mg/dL — ABNORMAL HIGH (ref 0.61–1.24)
GFR, Estimated: 57 mL/min — ABNORMAL LOW (ref >60)
Glucose, Bld: 95 mg/dL (ref 70–99)
Potassium: 4 mmol/L (ref 3.5–5.1)
Sodium: 141 mmol/L (ref 135–145)
Total Bilirubin: 0.6 mg/dL (ref 0.3–1.2)
Total Protein: 6.8 g/dL (ref 6.5–8.1)

## 2021-10-11 LAB — CBC
HCT: 43.9 % (ref 39.0–52.0)
Hemoglobin: 14.1 g/dL (ref 13.0–17.0)
MCH: 26.8 pg (ref 26.0–34.0)
MCHC: 32.1 g/dL (ref 30.0–36.0)
MCV: 83.5 fL (ref 80.0–100.0)
Platelets: 183 10*3/uL (ref 150–400)
RBC: 5.26 MIL/uL (ref 4.22–5.81)
RDW: 15.1 % (ref 11.5–15.5)
WBC: 7 10*3/uL (ref 4.0–10.5)
nRBC: 0 % (ref 0.0–0.2)

## 2021-10-11 LAB — PROTIME-INR
INR: 1.3 — ABNORMAL HIGH (ref 0.8–1.2)
Prothrombin Time: 16.5 seconds — ABNORMAL HIGH (ref 11.4–15.2)

## 2021-10-11 LAB — LACTATE DEHYDROGENASE: LDH: 196 U/L — ABNORMAL HIGH (ref 98–192)

## 2021-10-11 LAB — TSH: TSH: 1.387 u[IU]/mL (ref 0.350–4.500)

## 2021-10-11 LAB — PREALBUMIN: Prealbumin: 30.1 mg/dL (ref 18–38)

## 2021-10-11 LAB — MAGNESIUM: Magnesium: 1.9 mg/dL (ref 1.7–2.4)

## 2021-10-11 MED ORDER — ATORVASTATIN CALCIUM 40 MG PO TABS: 40.0000 mg | ORAL_TABLET | Freq: Every day | ORAL | 6 refills | Status: DC

## 2021-10-11 MED ORDER — FLUTICASONE PROPIONATE 50 MCG/ACT NA SUSP: 2.0000 | Freq: Every day | NASAL | 3 refills | Status: DC

## 2021-10-11 MED ORDER — ADULT MULTIVITAMIN W/MINERALS CH: 1.0000 | ORAL_TABLET | Freq: Every day | ORAL | 6 refills | Status: DC

## 2021-10-11 MED ORDER — PANTOPRAZOLE SODIUM 40 MG PO TBEC: 40.0000 mg | DELAYED_RELEASE_TABLET | Freq: Every day | ORAL | 6 refills | Status: DC

## 2021-10-11 NOTE — Progress Notes (Signed)
LVAD Clinic Follow Up Note HF MD: Dr Haroldine Laws  HPI: David [name] is a 56 y.o. male with history of ICD (09 PCI to LAD, mRCA, '10 PCI to Lcx and '12 mRCA), HFrEF (EF ~20% for > 5 years), HLD and HTN. S/P LVAD HM-III 04/20/18.    Admitted 04/19/17 - 05/27/2017 for scheduled LVAD implant. On 12/27 he underwent HMIII LVAD implant. Hospital course c/b PNA shock liver and AKI. Required short term HD.  Readmitted 05/30/17 hgb 3.8. INR 2.9.transfused 4u pRBCs. 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 Underwent ventriculostomy. Admission c/b by periods of confusion and urinary retention. Discharged to [place] for routine f/u with his wife. FEels good. Headaches have resolved. Denies neuro symptoms. Denies orthopnea or PND. No fevers, chills or problems with driveline. No bleeding, melena or neuro symptoms. No VAD alarms. Taking all meds as prescribed. Having some post-nasal drip and congestions.     VAD Indication: Destination therapy- CKD   VAD interrogation & Equipment Management: Speed: 5400 Flow: 4.8 Power: 4.2w    PI: 3.9   Alarms: none Events: 10 PI events daily  Fixed speed 5400 Low speed limit: 5100   Primary Controller:  Replace back up battery in 26 months Back up controller:  Expired. Replaced with [id number] in 29 mths   Annual Equipment Maintenance on UBC/PM was performed on 08/03/21.   I reviewed the LVAD parameters from today and compared the results to the patient's prior recorded data. LVAD interrogation was NEGATIVE for significant power changes, negative for clinical alarms and STABLE for PI events/speed drops. No programming changes were made and pump is functioning within specified parameters. Pt is performing daily controller and system monitor self tests along with completing weekly and monthly maintenance for LVAD  equipment.   LVAD equipment check completed and is in good working order. Back up equipment not present.     Exit Site Care: Drive line is being maintained weekly by [name], his wife. Exit site healed and incorporated, the velour is fully implanted at exit site. No redness, drainage or foul odor noted.  Past Medical History:  Diagnosis Date   AICD (automatic cardioverter/defibrillator) present    Anemia    Anxiety    CAD (coronary artery disease) 2009   AMI in 12/2007 with PCI to LAD, staged PCI to  mid/distal RCA, NSTEMI in 02/2009 with BMS to LCx   CHF (congestive heart failure) ([place])    Chronic kidney disease 11/03/2016   stage 3 kidney disease   Dysrhythmia    "arrythmia problems at some point", "fatal rhythms"   History of blood transfusion    "I was bleeding from was rectum" (04/19/2017)   HLD (hyperlipidemia)    HTN (hypertension)    Hyperlipidemia 03/15/2011   Ischemic cardiomyopathy    Admitted in 07/2010 with CHF exacerbation   Myocardial infarction ([place])    "I've had 4" (04/19/2017)   Pneumonia 2018 X 1   Seizures ([place])    one seizure in 04/2016 during cardiac event (04/19/2017)   STEMI 2009 (anterior), 2010 (lateral), and 2012 (inferior) 03/14/2011    Current Outpatient Medications  Medication Sig Dispense Refill   amiodarone (PACERONE) 200 MG tablet Take 1 tablet (200 mg total) by mouth daily. 30 tablet 5   fluticasone (FLONASE) 50 MCG/ACT nasal spray Place 2 sprays into both nostrils  daily. 9.9 mL 3   losartan (COZAAR) 50 MG tablet TAKE 1 TABLET BY MOUTH ONCE DAILY 90 tablet 3   magnesium oxide (MAG-OX) 400 (240 Mg) MG tablet Take 1 tablet (400 mg total) by mouth 2 (two) times daily. 60 tablet 0   Mouthwashes (MOUTH RINSE) LIQD solution 15 mLs by Mouth Rinse route 2 (two) times daily.  0   polyethylene glycol (MIRALAX / GLYCOLAX) 17 g packet Take 17 g by mouth daily. 30 each 0   tamsulosin (FLOMAX) 0.4 MG CAPS capsule Take 1 capsule (0.4 mg total) by mouth  daily. 90 capsule 3   topiramate (TOPAMAX) 50 MG tablet Take 1 tablet (50 mg total) by mouth 2 (two) times daily. 60 tablet 6   traZODone (DESYREL) 50 MG tablet TAKE 1 TABLET BY MOUTH ONCE DAILY AT BEDTIME (Patient taking differently: Take 50 mg by mouth at bedtime.) 30 tablet 6   warfarin (COUMADIN) 2 MG tablet Take 2 mg (1 tablet) every MWF and 1 mg (0.5 tab) all other days or as directed by HF Clinic. 30 tablet 11   acetaminophen (TYLENOL) 325 MG tablet Take 2 tablets (650 mg total) by mouth every 4 (four) hours as needed for headache or mild pain. (Patient not taking: Reported on 10/11/2021)     amoxicillin-clavulanate (AUGMENTIN) 875-125 MG tablet Take 1 tablet by mouth 2 (two) times daily. (Patient not taking: Reported on 10/11/2021) 14 tablet 0   atorvastatin (LIPITOR) 40 MG tablet Take 1 tablet (40 mg total) by mouth daily. 90 tablet 6   Multiple Vitamin (MULTIVITAMIN WITH MINERALS) TABS tablet Take 1 tablet by mouth daily. 30 tablet 6   pantoprazole (PROTONIX) 40 MG tablet Take 1 tablet (40 mg total) by mouth daily. 90 tablet 6   polyvinyl alcohol (LIQUIFILM TEARS) 1.4 % ophthalmic solution Place 2 drops into both eyes as needed for dry eyes. (Patient not taking: Reported on 10/11/2021) 15 mL 0   senna (SENOKOT) 8.6 MG TABS tablet Take 2 tablets (17.2 mg total) by mouth at bedtime. (Patient not taking: Reported on 10/11/2021) 120 tablet 0   traMADol HCl 100 MG TABS Take 100 mg by mouth every 12 (twelve) hours as needed. (Patient not taking: Reported on 10/11/2021) 60 tablet 0   No current facility-administered medications for this encounter.    Plavix [clopidogrel bisulfate]  Review of systems complete and found to be negative unless listed in HPI.      Vitals:   10/11/21 1129 10/11/21 1130  BP: 112/62 (!) 110/0  Pulse: 77   SpO2: 97%     Wt Readings from Last 3 Encounters:  10/11/21 74.4 kg (164 lb)  08/16/21 77 kg (169 lb 12.8 oz)  08/03/21 76.6 kg (168 lb 12.8 oz)     Vital  Signs:  Doppler Pressure: 110 Automatc BP: 112/62 (85) HR: 77 SPO2: 97    Weight: 164 lb w/ eqt Last weight: 168.8 lb    Physical Exam: General:  NAD.  HEENT: normal  Neck: supple. JVP not elevated.  Carotids 2+ bilat; no bruits. No lymphadenopathy or thryomegaly appreciated. Cor: LVAD hum.  Lungs: Clear. Abdomen: obese soft, nontender, non-distended. No hepatosplenomegaly. No bruits or masses. Good bowel sounds. Driveline site clean. Anchor in place.  Extremities: no cyanosis, clubbing, rash. Warm no edema  Neuro: alert & oriented x 3. No focal deficits. Moves all 4 without problem    ASSESSMENT AND PLAN:  1. SAH - admit in 2/23 with [facility] requiring ventriculostomy - much improved.  HAs resolved - BP control now at target   2. Chronic systolic CHF with HM III LVAD implant 04/20/17:  - Overall doing well with VAD support. - NYHA I - Volume status ok  - He has been seen at [facility] for transplant referral but has decided not to pursue VAD. No change  3. AFL with RVR - had DC-CV on 10/17/18 with subsequent short episodes of AF after but has been mostly in NSR on low-dose amio (100 daily) - Had recurrent AF in 10/22. Amio increased to 200 bid and converted.  - Remains in NSR. Continue amio 200 daily   4. h/o UGI AVM bleed - s/p APC x 4 lesions in 2/19 - No recurrent bleeding  - Continue PPI - Off ASA due to GIB.  - Hgb stable at 14.1  5. CKD Stage IIIa:  - Creatinine baseline 1.3 - 1.7  - Creatinine 1.43 today - Continue to follow - Consider SGLT2i  6. VAD management - VAD interrogated personally. Parameters stable. - LDH 196 - INR goal 1.5-2.0 with h/o SAH Off ASA   - INR 1.3 Discussed dosing with PharmD personally. - BP now at goal. Continue losartan - DL site ok  7. CAD:  s/p PCI RCA/PLOM with DES x2 and DES to mild LAD 1/18 - No s/s angina  - Continue statin. Off ASA due to GIB  8. Hypomagnesemia. -  Mag 1.9 Continue mag supplements  9. HTN - needs tight  BP control with h/o SAH.  - BP improved. Continue losartan 50 daily  10. Post-nasal drip - try flonase  10. [employer] fan - unfortunately this has been incurable even with VAD implant - Judson Roch still working on getting him a [employer] hat and a t-shirt  Total time spent 35 minutes. Over half that time spent discussing above.    Glori Bickers, MD  6:04 PM

## 2021-10-11 NOTE — Progress Notes (Addendum)
Patient presents for 2 mo follow up in [facility] today with [name]) wife. Reports no problems or concerns with drive line.  Patient says he has been feeling better. He states that he has not taken any tramadol for headache in over a week. He denies any lightheadedness, dizziness, falls, shortness of breath, and signs of bleeding.   BP good today. Pt/[name] confirm that pt increased Losartan as instructed at his last visit.  Pt is having PT/OT outpatient [facility] a [facility] at [facility].  Pt states that his cough/congestion cleared up after taking the Augmentin but [name] states that she feels the cough is starting to come back.  Vital Signs:  Doppler Pressure: 110 Automatc BP: 112/62 (85) HR: 77 SPO2: 97    Weight: 164 lb w/ eqt Last weight: 168.8 lb  VAD Indication: Destination therapy- CKD   VAD interrogation & Equipment Management: Speed: 5400 Flow: 4.8 Power: 4.2w    PI: 3.9   Alarms: none Events: 10 PI events daily  Fixed speed 5400 Low speed limit: 5100   Primary Controller:  Replace back up battery in 26 months Back up controller:  Expired. Replaced with [id number] in 29 mths   Annual Equipment Maintenance on UBC/PM was performed on 08/03/21.   I reviewed the LVAD parameters from today and compared the results to the patient's prior recorded data. LVAD interrogation was NEGATIVE for significant power changes, negative for clinical alarms and STABLE for PI events/speed drops. No programming changes were made and pump is functioning within specified parameters. Pt is performing daily controller and system monitor self tests along with completing weekly and monthly maintenance for LVAD equipment.   LVAD equipment check completed and is in good working order. Back up equipment not present.     Exit Site Care: Drive line is being maintained weekly by [name], his wife. Exit site healed and incorporated, the velour is fully implanted at exit site. No redness,  drainage or foul odor noted. Pt denies fever or chills. Pt given 8 weekly dressings for home use and 8 anchors.  Device: Medtronic Bi-V Therapies: on 167 Pacing:  VVI 50 Last check:  07/28/21   BP & Labs:  Doppler BP 110 - Doppler is reflecting Modified systolic   Hgb 53.6 - No S/S of bleeding. Specifically denies melena/BRBPR or nosebleeds.  LDH stable at 196 with established baseline of 165- 250. Denies tea-colored urine. No power elevations noted on interrogation.        4.5 year Intermacs follow up completed including:  Quality of Life, KCCQ-12, and Neurocognitive trail making.   Pt completed 1320 feet during 6 minute walk.  Back up controller:  11V backup battery charged during this visit.  Patient Goals: Keep getting stronger, keep the headaches away  [place]     10/11/2021   11:31 AM 05/03/2021   12:52 PM 10/05/2020    1:15 PM  KCCQ-12  1 a. Ability to shower/bathe Not at all limited Not at all limited Not at all limited  1 b. Ability to walk 1 block Not at all limited Not at all limited Not at all limited  1 c. Ability to hurry/jog Slightly limited Slightly limited Not at all limited  2. Edema feet/ankles/legs Never over the past 2 weeks Never over the past 2 weeks Never over the past 2 weeks  3. Limited by fatigue Never over the past 2 weeks Never over the past 2 weeks Never over the past 2 weeks  4. Limited  by dyspnea Never over the past 2 weeks Never over the past 2 weeks Never over the past 2 weeks  5. Sitting up / on 3+ pillows Never over the past 2 weeks Never over the past 2 weeks Never over the past 2 weeks  6. Limited enjoyment of life Slightly limited Moderately limited Slightly limited  7. Rest of life w/ symptoms Mostly satisfied Mostly satisfied Completely satisfied  8 a. Participation in hobbies Limited quite a bit Limited quite a bit Slightly limited  8 b. Participation in chores Severely limited Moderately limited Did not  limit at all  8 c. Visiting family/friends Did not limit at all Did not limit at all Did not limit at all     Patient Instructions:  Start Flonase 2 sprays once daily Return to clinic in 2 months  Tanda Rockers RN [place] Coordinator  Office: [phone]  24/7 Pager: [phone]

## 2021-10-11 NOTE — Progress Notes (Signed)
CSW met with patient and wife in the clinic. Patient's wife was emotional and expressed how thankful she is to the team for all the support to both patient and self. She stated that she is improved but still struggles with financial needs due to reduced food stamps. CSW provided food bag to help bridge the gap this month. Wife grateful for the support. Jackie , LCSW, CCSW-MCS [phone]  

## 2021-10-11 NOTE — Patient Instructions (Signed)
Start Flonase 2 sprays once daily Return to clinic in 2 months

## 2021-10-11 NOTE — Progress Notes (Signed)
LVAD INR 

## 2021-10-12 LAB — T4: T4, Total: 11.5 ug/dL (ref 4.5–12.0)

## 2021-10-18 ENCOUNTER — Ambulatory Visit (HOSPITAL_COMMUNITY): Payer: Self-pay | Admitting: Pharmacist

## 2021-10-18 LAB — POCT INR: INR: 1.3 — AB (ref 2.0–3.0)

## 2021-10-20 ENCOUNTER — Telehealth (HOSPITAL_COMMUNITY): Payer: Self-pay | Admitting: *Deleted

## 2021-10-20 NOTE — Telephone Encounter (Signed)
Wife called this am to report change in patient status. She reports he usually gets up in morning and stays awake until 12 - 2 pm at which time he takes a nap  He awoke this am and has been "falling aleep" every few minutes while up in chair. He arouses easily to verbal stimuli, does not have one sided weakness in extremities, no slurred speech, has symmetrical smile, follows commands, and is alert and oriented. Wife is worried that this is such "a change". Asked her to monitor him closely, I will update Dr. Haroldine Laws and call her with his instructions. VAD parameters are wnl with speed 5400, flow 4.2, power 4.1, PI 5.7.  Called [place] @ 2 hours after initial call this am to inform her if patient remains sleepy, he will need to come to ED today for CT of head. [name] reports he is "more awake" and back to normal status. She forgot that he went to bed at [facility] last night (he normally retires at 8:00 pm) and contributes this mornings sleepiness to later than usual bedtime last night.  Asked her to call VAD pager if any further changes in neuro status and that patient will need to come to ED for head CT. She verbalized understanding of same.  Zada Girt RN, [place] Coordinator [phone]

## 2021-10-25 ENCOUNTER — Ambulatory Visit (HOSPITAL_COMMUNITY): Payer: Self-pay

## 2021-10-25 LAB — POCT INR: INR: 1.6 — AB (ref 2.0–3.0)

## 2021-10-25 NOTE — Progress Notes (Signed)
LVAD INR 

## 2021-10-27 ENCOUNTER — Ambulatory Visit (INDEPENDENT_AMBULATORY_CARE_PROVIDER_SITE_OTHER): Payer: Medicare HMO

## 2021-10-27 DIAGNOSIS — I255 Ischemic cardiomyopathy: Secondary | ICD-10-CM | POA: Diagnosis not present

## 2021-10-30 LAB — CUP PACEART REMOTE DEVICE CHECK
Battery Remaining Longevity: 40 mo
Battery Voltage: 2.96 V
Brady Statistic AP VP Percent: 0 %
Brady Statistic AP VS Percent: 0 %
Brady Statistic AS VP Percent: 0.05 %
Brady Statistic AS VS Percent: 99.95 %
Brady Statistic RA Percent Paced: 0 %
Brady Statistic RV Percent Paced: 0.06 %
Date Time Interrogation Session: 20230706222306
HighPow Impedance: 65 Ohm
Implantable Lead Implant Date: 20180409
Implantable Lead Implant Date: 20180409
Implantable Lead Implant Date: 20180716
Implantable Lead Implant Date: 20180716
Implantable Lead Location: 753858
Implantable Lead Location: 753858
Implantable Lead Location: 753859
Implantable Lead Location: 753860
Implantable Lead Model: 5076
Implantable Lead Model: 511212
Implantable Lead Model: 511212
Implantable Lead Serial Number: 263773
Implantable Lead Serial Number: 263775
Implantable Pulse Generator Implant Date: 20180409
Lead Channel Impedance Value: 247 Ohm
Lead Channel Impedance Value: 323 Ohm
Lead Channel Impedance Value: 3458 Ohm
Lead Channel Impedance Value: 399 Ohm
Lead Channel Impedance Value: 4047 Ohm
Lead Channel Impedance Value: 779 Ohm
Lead Channel Pacing Threshold Amplitude: 0.625 V
Lead Channel Pacing Threshold Amplitude: 1 V
Lead Channel Pacing Threshold Amplitude: 1.75 V
Lead Channel Pacing Threshold Pulse Width: 0.4 ms
Lead Channel Pacing Threshold Pulse Width: 0.4 ms
Lead Channel Pacing Threshold Pulse Width: 1 ms
Lead Channel Sensing Intrinsic Amplitude: 13.375 mV
Lead Channel Sensing Intrinsic Amplitude: 13.375 mV
Lead Channel Sensing Intrinsic Amplitude: 3 mV
Lead Channel Sensing Intrinsic Amplitude: 3 mV
Lead Channel Setting Pacing Amplitude: 2.5 V
Lead Channel Setting Pacing Pulse Width: 0.4 ms
Lead Channel Setting Sensing Sensitivity: 0.6 mV

## 2021-11-01 ENCOUNTER — Ambulatory Visit (HOSPITAL_COMMUNITY): Payer: Self-pay | Admitting: Pharmacist

## 2021-11-01 LAB — POCT INR: INR: 1.3 — AB (ref 2.0–3.0)

## 2021-11-01 NOTE — Progress Notes (Signed)
LVAD INR 

## 2021-11-03 ENCOUNTER — Other Ambulatory Visit (HOSPITAL_COMMUNITY): Payer: Self-pay | Admitting: Internal Medicine

## 2021-11-03 DIAGNOSIS — I4892 Unspecified atrial flutter: Secondary | ICD-10-CM

## 2021-11-03 DIAGNOSIS — Z95811 Presence of heart assist device: Secondary | ICD-10-CM

## 2021-11-08 ENCOUNTER — Ambulatory Visit (HOSPITAL_COMMUNITY): Payer: Self-pay | Admitting: Pharmacist

## 2021-11-08 LAB — POCT INR: INR: 1.3 — AB (ref 2.0–3.0)

## 2021-11-10 ENCOUNTER — Other Ambulatory Visit (HOSPITAL_COMMUNITY): Payer: Self-pay | Admitting: *Deleted

## 2021-11-10 DIAGNOSIS — Z95811 Presence of heart assist device: Secondary | ICD-10-CM

## 2021-11-10 DIAGNOSIS — I4892 Unspecified atrial flutter: Secondary | ICD-10-CM

## 2021-11-10 MED ORDER — AMIODARONE HCL 200 MG PO TABS: 200.0000 mg | ORAL_TABLET | Freq: Every day | ORAL | 3 refills | Status: DC

## 2021-11-15 ENCOUNTER — Ambulatory Visit (HOSPITAL_COMMUNITY): Payer: Self-pay | Admitting: Pharmacist

## 2021-11-15 LAB — POCT INR: INR: 1.6 — AB (ref 2.0–3.0)

## 2021-11-17 NOTE — Progress Notes (Signed)
Remote ICD transmission.   

## 2021-11-18 ENCOUNTER — Other Ambulatory Visit (HOSPITAL_COMMUNITY): Payer: Self-pay | Admitting: Unknown Physician Specialty

## 2021-11-18 MED ORDER — WARFARIN SODIUM 2 MG PO TABS: ORAL_TABLET | ORAL | 11 refills | Status: DC

## 2021-11-22 ENCOUNTER — Ambulatory Visit (HOSPITAL_COMMUNITY): Payer: Self-pay | Admitting: Pharmacist

## 2021-11-22 LAB — POCT INR: INR: 1.6 — AB (ref 2.0–3.0)

## 2021-11-22 NOTE — Progress Notes (Signed)
LVAD INR 

## 2021-11-29 ENCOUNTER — Ambulatory Visit (HOSPITAL_COMMUNITY): Payer: Self-pay | Admitting: Pharmacist

## 2021-11-29 LAB — POCT INR: INR: 1.6 — AB (ref 2.0–3.0)

## 2021-12-06 ENCOUNTER — Ambulatory Visit (HOSPITAL_COMMUNITY): Payer: Self-pay | Admitting: Pharmacist

## 2021-12-06 LAB — POCT INR: INR: 1.6 — AB (ref 2.0–3.0)

## 2021-12-09 ENCOUNTER — Other Ambulatory Visit (HOSPITAL_COMMUNITY): Payer: Self-pay

## 2021-12-09 DIAGNOSIS — Z95811 Presence of heart assist device: Secondary | ICD-10-CM

## 2021-12-09 DIAGNOSIS — Z7901 Long term (current) use of anticoagulants: Secondary | ICD-10-CM

## 2021-12-13 ENCOUNTER — Ambulatory Visit (HOSPITAL_COMMUNITY)
Admission: RE | Admit: 2021-12-13 | Discharge: 2021-12-13 | Disposition: A | Discharge status: Home / Self Care | Payer: Medicare HMO | Source: Ambulatory Visit | Attending: Internal Medicine | Admitting: Internal Medicine

## 2021-12-13 ENCOUNTER — Ambulatory Visit (HOSPITAL_COMMUNITY): Payer: Self-pay | Admitting: Pharmacist

## 2021-12-13 ENCOUNTER — Encounter (HOSPITAL_COMMUNITY): Payer: Self-pay

## 2021-12-13 VITALS — BP 130/0 | HR 68 | Wt 162.6 lb

## 2021-12-13 DIAGNOSIS — Z79899 Other long term (current) drug therapy: Secondary | ICD-10-CM | POA: Diagnosis not present

## 2021-12-13 DIAGNOSIS — G4700 Insomnia, unspecified: Secondary | ICD-10-CM

## 2021-12-13 DIAGNOSIS — Z5941 Food insecurity: Secondary | ICD-10-CM | POA: Diagnosis not present

## 2021-12-13 DIAGNOSIS — I609 Nontraumatic subarachnoid hemorrhage, unspecified: Secondary | ICD-10-CM | POA: Diagnosis not present

## 2021-12-13 DIAGNOSIS — I10 Essential (primary) hypertension: Secondary | ICD-10-CM | POA: Diagnosis not present

## 2021-12-13 DIAGNOSIS — Z7901 Long term (current) use of anticoagulants: Secondary | ICD-10-CM

## 2021-12-13 DIAGNOSIS — Z95811 Presence of heart assist device: Secondary | ICD-10-CM | POA: Diagnosis not present

## 2021-12-13 LAB — CBC
HCT: 43.2 % (ref 39.0–52.0)
Hemoglobin: 13.6 g/dL (ref 13.0–17.0)
MCH: 26.7 pg (ref 26.0–34.0)
MCHC: 31.5 g/dL (ref 30.0–36.0)
MCV: 84.7 fL (ref 80.0–100.0)
Platelets: 196 10*3/uL (ref 150–400)
RBC: 5.1 MIL/uL (ref 4.22–5.81)
RDW: 15.3 % (ref 11.5–15.5)
WBC: 6.4 10*3/uL (ref 4.0–10.5)
nRBC: 0 % (ref 0.0–0.2)

## 2021-12-13 LAB — PROTIME-INR
INR: 1.8 — ABNORMAL HIGH (ref 0.8–1.2)
Prothrombin Time: 20.3 seconds — ABNORMAL HIGH (ref 11.4–15.2)

## 2021-12-13 LAB — BASIC METABOLIC PANEL
Anion gap: 5 (ref 5–15)
BUN: 16 mg/dL (ref 6–20)
CO2: 22 mmol/L (ref 22–32)
Calcium: 9.1 mg/dL (ref 8.9–10.3)
Chloride: 111 mmol/L (ref 98–111)
Creatinine, Ser: 1.82 mg/dL — ABNORMAL HIGH (ref 0.61–1.24)
GFR, Estimated: 43 mL/min — ABNORMAL LOW (ref >60)
Glucose, Bld: 84 mg/dL (ref 70–99)
Potassium: 4.7 mmol/L (ref 3.5–5.1)
Sodium: 138 mmol/L (ref 135–145)

## 2021-12-13 LAB — LACTATE DEHYDROGENASE: LDH: 183 U/L (ref 98–192)

## 2021-12-13 LAB — MAGNESIUM: Magnesium: 1.9 mg/dL (ref 1.7–2.4)

## 2021-12-13 MED ORDER — TRAZODONE HCL 50 MG PO TABS: 50.0000 mg | ORAL_TABLET | Freq: Every day | ORAL | 3 refills | Status: DC

## 2021-12-13 MED ORDER — LOSARTAN POTASSIUM 50 MG PO TABS: 100.0000 mg | ORAL_TABLET | Freq: Every day | ORAL | 3 refills | Status: DC

## 2021-12-13 MED ORDER — BLOOD PRESSURE CUFF MISC: 1.0000 | Freq: Every day | 0 refills | Status: DC

## 2021-12-13 NOTE — Progress Notes (Signed)
CSW met with patient and wife in the clinic. Patient's wife shared continued financial struggles and recently had some medical issues herself requiring multiple follow ups. Wife asked for gas cards to assist with their transportation to clinic. CSW also discussed food insecurity and provided a food bag during the visit today. Wife verbalizes understanding of limited assistance in the future as they have maxed support from the Patient [place]. CSW will continue to provide support and assistance as needed. Raquel Sarna, [place], [place]

## 2021-12-13 NOTE — Progress Notes (Signed)
Patient presents for 2 mo follow up in [facility] today with David [name]) wife. No complaints from patient. David [name] voices concerns about modular cable twisted. LVAD Coordinator replaced cable today and instructed patient to be mindful about untangling line to prevent damage.   David [name] voices concerns about leaving patient home alone. Patient feels safe being at home alone. Dr Haroldine Laws dicussed situation with patient and David [name] at bedside stating that they can gradually increase the time that patient left alone and continue to assess readiness as they feel is best.   Modular cable changed at the bedside to Lot# [id number]    Patient's blood pressure up slightly today. Will increase Losartan per Dr Haroldine Laws.   Vital Signs:  Doppler Pressure: 130 Automatc BP: 121/74 (101) HR: 68 NSR SPO2: 96    Weight: 162.6 lb w/ eqt Last weight: 164 lb  VAD Indication: Destination therapy- CKD   VAD interrogation & Equipment Management: Speed: 5400 [name]: 4 Power: 4.1w    PI: 7   Alarms: none Events: 2-5 PI events daily  Fixed speed 5400 Low speed limit: 5100   Primary Controller:  Replace back up battery in 26 months Back up controller:  Replace back up battery in 29 mths   Annual Equipment Maintenance on UBC/PM was performed on 08/03/21.   I reviewed the LVAD parameters from today and compared the results to the patient's prior recorded data. LVAD interrogation was NEGATIVE for significant power changes, negative for clinical alarms and STABLE for PI events/speed drops. No programming changes were made and pump is functioning within specified parameters. Pt is performing daily controller and system monitor self tests along with completing weekly and monthly maintenance for LVAD equipment.   LVAD equipment check completed and is in good working order. Back up equipment not present.     Exit Site Care: Drive line is being maintained weekly by David [name], his wife. Exit site healed and  incorporated, the velour is fully implanted at exit site. No redness, drainage or foul odor noted. Pt denies fever or chills. Pt given 8 weekly dressings for home use and 8 anchors.  Device: Medtronic Bi-V Therapies: on 167 Pacing:  VVI 50 Last check:  07/28/21   BP & Labs:  Doppler BP 130 - Doppler is reflecting Modified systolic; Losartan increased to 100mg  daily per Dr Haroldine Laws   Hgb 13.6 - No S/S of bleeding. Specifically denies melena/BRBPR or nosebleeds.  LDH stable at 183 with established baseline of 165- 250. Denies tea-colored urine. No power elevations noted on interrogation.        Patient Instructions:  Increase Losartan to 100mg  once daily (2 tablets) Return to clinic in 2 months  [place] Coordinator  Office: [phone]  24/7 Pager: [phone]

## 2021-12-13 NOTE — Patient Instructions (Signed)
Increase Losartan to 100mg  once daily (2 tablets) Return to clinic in 2 months

## 2021-12-13 NOTE — Progress Notes (Signed)
LVAD Clinic Follow Up Note HF MD: Dr Haroldine Laws  HPI: David [name] is a 56 y.o. male with history of ICD (09 PCI to LAD, mRCA, '10 PCI to Lcx and '12 mRCA), HFrEF (EF ~20% for > 5 years), HLD and HTN. S/P LVAD HM-III 04/20/18.    Admitted 04/19/17 - 05/27/2017 for scheduled LVAD implant. On 12/27 he underwent HMIII LVAD implant. Hospital course c/b PNA shock liver and AKI. Required short term HD.  Readmitted 05/30/17 hgb 3.8. INR 2.9.transfused 4u pRBCs. 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 Underwent ventriculostomy. Admission c/b by periods of confusion and urinary retention. Discharged to [place] for routine f/u with his wife. Feels good. HAs resolved. Wife has concerns about leaving him alone at home. Denies orthopnea or PND. No fevers, chills or problems with driveline. No bleeding, melena or neuro symptoms. No VAD alarms. Taking all meds as prescribed.    VAD Indication: Destination therapy- CKD   VAD interrogation & Equipment Management: Speed: 5400 Flow: 4 Power: 4.1w    PI: 7   Alarms: none Events: 2-5 PI events daily  Fixed speed 5400 Low speed limit: 5100   Primary Controller:  Replace back up battery in 26 months Back up controller:  Replace back up battery in 29 mths   Annual Equipment Maintenance on UBC/PM was performed on 08/03/21.   I reviewed the LVAD parameters from today and compared the results to the patient's prior recorded data. LVAD interrogation was NEGATIVE for significant power changes, negative for clinical alarms and STABLE for PI events/speed drops. No programming changes were made and pump is functioning within specified parameters. Pt is performing daily controller and system monitor self tests along with completing weekly and monthly maintenance for LVAD equipment.   LVAD equipment check completed  and is in good working order. Back up equipment not present.     Exit Site Care: Drive line is being maintained weekly by [name], his wife. Exit site healed and incorporated, the velour is fully implanted at exit site. No redness, drainage or foul odor noted.  Past Medical History:  Diagnosis Date   AICD (automatic cardioverter/defibrillator) present    Anemia    Anxiety    CAD (coronary artery disease) 2009   AMI in 12/2007 with PCI to LAD, staged PCI to  mid/distal RCA, NSTEMI in 02/2009 with BMS to LCx   CHF (congestive heart failure) ([place])    Chronic kidney disease 11/03/2016   stage 3 kidney disease   Dysrhythmia    "arrythmia problems at some point", "fatal rhythms"   History of blood transfusion    "I was bleeding from was rectum" (04/19/2017)   HLD (hyperlipidemia)    HTN (hypertension)    Hyperlipidemia 03/15/2011   Ischemic cardiomyopathy    Admitted in 07/2010 with CHF exacerbation   Myocardial infarction ([place])    "I've had 4" (04/19/2017)   Pneumonia 2018 X 1   Seizures ([place])    one seizure in 04/2016 during cardiac event (04/19/2017)   STEMI 2009 (anterior), 2010 (lateral), and 2012 (inferior) 03/14/2011    Current Outpatient Medications  Medication Sig Dispense Refill   amiodarone (PACERONE) 200 MG tablet Take 1 tablet (200 mg total) by mouth daily. 90 tablet 3   atorvastatin (LIPITOR) 40 MG tablet Take 1 tablet (40 mg total) by mouth daily.  90 tablet 6   fluticasone (FLONASE) 50 MCG/ACT nasal spray Place 2 sprays into both nostrils daily. 9.9 mL 3   losartan (COZAAR) 50 MG tablet TAKE 1 TABLET BY MOUTH ONCE DAILY 90 tablet 3   magnesium oxide (MAG-OX) 400 (240 Mg) MG tablet Take 1 tablet (400 mg total) by mouth 2 (two) times daily. 60 tablet 0   Mouthwashes (MOUTH RINSE) LIQD solution 15 mLs by Mouth Rinse route 2 (two) times daily.  0   Multiple Vitamin (MULTIVITAMIN WITH MINERALS) TABS tablet Take 1 tablet by mouth daily. 30 tablet 6   pantoprazole (PROTONIX)  40 MG tablet Take 1 tablet (40 mg total) by mouth daily. 90 tablet 6   polyethylene glycol (MIRALAX / GLYCOLAX) 17 g packet Take 17 g by mouth daily. 30 each 0   tamsulosin (FLOMAX) 0.4 MG CAPS capsule Take 1 capsule (0.4 mg total) by mouth daily. 90 capsule 3   topiramate (TOPAMAX) 50 MG tablet Take 1 tablet (50 mg total) by mouth 2 (two) times daily. 60 tablet 6   traZODone (DESYREL) 50 MG tablet TAKE 1 TABLET BY MOUTH ONCE DAILY AT BEDTIME (Patient taking differently: Take 50 mg by mouth at bedtime.) 30 tablet 6   warfarin (COUMADIN) 2 MG tablet Take 2 mg (1 tablet) every Sun, MWF and 1 mg (0.5 tab) all other days or as directed by HF Clinic. 30 tablet 11   acetaminophen (TYLENOL) 325 MG tablet Take 2 tablets (650 mg total) by mouth every 4 (four) hours as needed for headache or mild pain. (Patient not taking: Reported on 10/11/2021)     polyvinyl alcohol (LIQUIFILM TEARS) 1.4 % ophthalmic solution Place 2 drops into both eyes as needed for dry eyes. (Patient not taking: Reported on 10/11/2021) 15 mL 0   senna (SENOKOT) 8.6 MG TABS tablet Take 2 tablets (17.2 mg total) by mouth at bedtime. (Patient not taking: Reported on 10/11/2021) 120 tablet 0   No current facility-administered medications for this encounter.    Plavix [clopidogrel bisulfate]  Review of systems complete and found to be negative unless listed in HPI.      There were no vitals filed for this visit.   Wt Readings from Last 3 Encounters:  10/11/21 74.4 kg (164 lb)  08/16/21 77 kg (169 lb 12.8 oz)  08/03/21 76.6 kg (168 lb 12.8 oz)     Vital Signs:  Doppler Pressure: 130 Automatc BP: 121/74 (101) HR: 68 NSR SPO2: 96    Weight: 162.6 lb w/ eqt Last weight: 164 lb  Physical Exam: General:  NAD.  HEENT: normal  Neck: supple. JVP not elevated.  Carotids 2+ bilat; no bruits. No lymphadenopathy or thryomegaly appreciated. Cor: LVAD hum.  Lungs: Clear. Abdomen: soft, nontender, non-distended. No hepatosplenomegaly.  No bruits or masses. Good bowel sounds. Driveline site clean. Anchor in place.  Extremities: no cyanosis, clubbing, rash. Warm no edema  Neuro: alert & oriented x 3. No focal deficits. Moves all 4 without problem     ASSESSMENT AND PLAN:  1. SAH - admit in 2/23 with [facility] requiring ventriculostomy - much improved. HAs resolved.  - BP still up. Increase losartan  2. Chronic systolic CHF with HM III LVAD implant 04/20/17:  - Overall doing well with VAD support. - NYHA I-II - Volume status ok - He has been seen at [facility] for transplant referral but has decided not to pursue VAD.  - No change  3. AFL with RVR - had DC-CV on 10/17/18 with  subsequent short episodes of AF after but has been mostly in NSR on low-dose amio (100 daily) - Had recurrent AF in 10/22. Amio increased to 200 bid and converted.  - Remains in NSR. Continue amio 200 daily   4. h/o UGI AVM bleed - s/p APC x 4 lesions in 2/19 - No evidence of recurrent bleeding - Continue PPI - Off ASA due to GIB.  - Hgb stable at 13.6  5. CKD Stage IIIa:  - Creatinine baseline 1.3 - 1.7  - Creatinine 1.8 today - Continue to follow - Consider SGLT2i  6. VAD management - VAD interrogated personally. Parameters stable. - LDH 183 - INR goal 1.5-2.0 with h/o SAH Off ASA   - INR 1.8 Discussed dosing with PharmD personally. - BP remains high. Increase losartan to 100 daily - DL site ok  7. CAD:  s/p PCI RCA/PLOM with DES x2 and DES to mild LAD 1/18 -No s/s angina - Continue statin. Off ASA due to GIB  8. Hypomagnesemia. -  Mag 1.9 Continue mag supplements  9. HTN - needs tight BP control with h/o SAH.  - BP still elevated. Increase losartan to 100 daily  10. [employer] fan - unfortunately this has been incurable even with VAD implant - Judson Roch still working on getting him a [employer] hat and a t-shirt  Total time spent 35 minutes. Over half that time spent discussing above.    Glori Bickers, MD  11:30 AM

## 2021-12-20 ENCOUNTER — Ambulatory Visit (HOSPITAL_COMMUNITY): Payer: Self-pay | Admitting: Pharmacist

## 2021-12-20 LAB — POCT INR: INR: 1.5 — AB (ref 2.0–3.0)

## 2021-12-28 ENCOUNTER — Ambulatory Visit (HOSPITAL_COMMUNITY): Payer: Self-pay | Admitting: Pharmacist

## 2021-12-28 LAB — POCT INR: INR: 1.6 — AB (ref 2.0–3.0)

## 2022-01-03 ENCOUNTER — Ambulatory Visit (HOSPITAL_COMMUNITY): Payer: Self-pay | Admitting: Pharmacist

## 2022-01-03 LAB — POCT INR: INR: 1.3 — AB (ref 2.0–3.0)

## 2022-01-10 ENCOUNTER — Ambulatory Visit (HOSPITAL_COMMUNITY): Payer: Self-pay | Admitting: Pharmacist

## 2022-01-10 LAB — POCT INR: INR: 1.5 — AB (ref 2.0–3.0)

## 2022-01-17 ENCOUNTER — Ambulatory Visit (HOSPITAL_COMMUNITY): Payer: Self-pay | Admitting: Pharmacist

## 2022-01-17 LAB — POCT INR: INR: 1.3 — AB (ref 2.0–3.0)

## 2022-01-24 ENCOUNTER — Ambulatory Visit (HOSPITAL_COMMUNITY): Payer: Self-pay | Admitting: Pharmacist

## 2022-01-24 LAB — POCT INR: INR: 1.4 — AB (ref 2.0–3.0)

## 2022-01-24 NOTE — Progress Notes (Signed)
LVAD INR 

## 2022-01-26 ENCOUNTER — Ambulatory Visit (INDEPENDENT_AMBULATORY_CARE_PROVIDER_SITE_OTHER): Payer: Medicare HMO

## 2022-01-26 DIAGNOSIS — I255 Ischemic cardiomyopathy: Secondary | ICD-10-CM

## 2022-01-26 LAB — CUP PACEART REMOTE DEVICE CHECK
Battery Remaining Longevity: 35 mo
Battery Voltage: 2.96 V
Brady Statistic AP VP Percent: 0 %
Brady Statistic AP VS Percent: 0 %
Brady Statistic AS VP Percent: 0.19 %
Brady Statistic AS VS Percent: 99.81 %
Brady Statistic RA Percent Paced: 0 %
Brady Statistic RV Percent Paced: 0.2 %
Date Time Interrogation Session: 20231004033424
HighPow Impedance: 61 Ohm
Implantable Lead Implant Date: 20180409
Implantable Lead Implant Date: 20180409
Implantable Lead Implant Date: 20180716
Implantable Lead Implant Date: 20180716
Implantable Lead Location: 753858
Implantable Lead Location: 753858
Implantable Lead Location: 753859
Implantable Lead Location: 753860
Implantable Lead Model: 5076
Implantable Lead Model: 511212
Implantable Lead Model: 511212
Implantable Lead Serial Number: 263773
Implantable Lead Serial Number: 263775
Implantable Pulse Generator Implant Date: 20180409
Lead Channel Impedance Value: 247 Ohm
Lead Channel Impedance Value: 323 Ohm
Lead Channel Impedance Value: 3325 Ohm
Lead Channel Impedance Value: 399 Ohm
Lead Channel Impedance Value: 4047 Ohm
Lead Channel Impedance Value: 855 Ohm
Lead Channel Pacing Threshold Amplitude: 0.625 V
Lead Channel Pacing Threshold Amplitude: 1 V
Lead Channel Pacing Threshold Amplitude: 1.75 V
Lead Channel Pacing Threshold Pulse Width: 0.4 ms
Lead Channel Pacing Threshold Pulse Width: 0.4 ms
Lead Channel Pacing Threshold Pulse Width: 1 ms
Lead Channel Sensing Intrinsic Amplitude: 11.125 mV
Lead Channel Sensing Intrinsic Amplitude: 11.125 mV
Lead Channel Sensing Intrinsic Amplitude: 2.5 mV
Lead Channel Sensing Intrinsic Amplitude: 2.5 mV
Lead Channel Setting Pacing Amplitude: 2.5 V
Lead Channel Setting Pacing Pulse Width: 0.4 ms
Lead Channel Setting Sensing Sensitivity: 0.6 mV

## 2022-01-31 ENCOUNTER — Ambulatory Visit (HOSPITAL_COMMUNITY): Payer: Self-pay | Admitting: Pharmacist

## 2022-01-31 LAB — POCT INR: INR: 1.8 — AB (ref 2.0–3.0)

## 2022-02-04 NOTE — Progress Notes (Signed)
Remote ICD transmission.   

## 2022-02-07 ENCOUNTER — Ambulatory Visit (HOSPITAL_COMMUNITY): Payer: Self-pay | Admitting: Pharmacist

## 2022-02-07 LAB — POCT INR: INR: 1.4 — AB (ref 2.0–3.0)

## 2022-02-14 ENCOUNTER — Other Ambulatory Visit (HOSPITAL_COMMUNITY): Payer: Self-pay | Admitting: Internal Medicine

## 2022-02-14 ENCOUNTER — Ambulatory Visit (HOSPITAL_COMMUNITY): Payer: Self-pay | Admitting: Pharmacist

## 2022-02-14 DIAGNOSIS — R519 Headache, unspecified: Secondary | ICD-10-CM

## 2022-02-14 DIAGNOSIS — Z95811 Presence of heart assist device: Secondary | ICD-10-CM

## 2022-02-14 LAB — POCT INR: INR: 2.4 (ref 2.0–3.0)

## 2022-02-15 ENCOUNTER — Encounter (HOSPITAL_COMMUNITY): Payer: Medicare HMO

## 2022-02-17 ENCOUNTER — Ambulatory Visit (HOSPITAL_COMMUNITY)
Admission: RE | Admit: 2022-02-17 | Discharge: 2022-02-17 | Disposition: A | Discharge status: Home / Self Care | Payer: Medicare HMO | Source: Ambulatory Visit | Attending: Cardiology | Admitting: Cardiology

## 2022-02-17 ENCOUNTER — Ambulatory Visit (HOSPITAL_COMMUNITY): Payer: Self-pay | Admitting: Pharmacist

## 2022-02-17 VITALS — BP 115/0 | HR 73 | Ht 65.0 in | Wt 161.8 lb

## 2022-02-17 DIAGNOSIS — I609 Nontraumatic subarachnoid hemorrhage, unspecified: Secondary | ICD-10-CM

## 2022-02-17 DIAGNOSIS — Z955 Presence of coronary angioplasty implant and graft: Secondary | ICD-10-CM | POA: Diagnosis not present

## 2022-02-17 DIAGNOSIS — I5022 Chronic systolic (congestive) heart failure: Secondary | ICD-10-CM | POA: Insufficient documentation

## 2022-02-17 DIAGNOSIS — I480 Paroxysmal atrial fibrillation: Secondary | ICD-10-CM | POA: Diagnosis not present

## 2022-02-17 DIAGNOSIS — K7200 Acute and subacute hepatic failure without coma: Secondary | ICD-10-CM | POA: Insufficient documentation

## 2022-02-17 DIAGNOSIS — Z95810 Presence of automatic (implantable) cardiac defibrillator: Secondary | ICD-10-CM | POA: Insufficient documentation

## 2022-02-17 DIAGNOSIS — N1831 Chronic kidney disease, stage 3a: Secondary | ICD-10-CM | POA: Diagnosis not present

## 2022-02-17 DIAGNOSIS — Z8679 Personal history of other diseases of the circulatory system: Secondary | ICD-10-CM | POA: Diagnosis not present

## 2022-02-17 DIAGNOSIS — I130 Hypertensive heart and chronic kidney disease with heart failure and stage 1 through stage 4 chronic kidney disease, or unspecified chronic kidney disease: Secondary | ICD-10-CM | POA: Insufficient documentation

## 2022-02-17 DIAGNOSIS — Z95811 Presence of heart assist device: Secondary | ICD-10-CM | POA: Diagnosis not present

## 2022-02-17 DIAGNOSIS — E785 Hyperlipidemia, unspecified: Secondary | ICD-10-CM | POA: Insufficient documentation

## 2022-02-17 DIAGNOSIS — I10 Essential (primary) hypertension: Secondary | ICD-10-CM

## 2022-02-17 DIAGNOSIS — Z79899 Other long term (current) drug therapy: Secondary | ICD-10-CM | POA: Insufficient documentation

## 2022-02-17 LAB — BASIC METABOLIC PANEL
Anion gap: 10 (ref 5–15)
BUN: 13 mg/dL (ref 6–20)
CO2: 21 mmol/L — ABNORMAL LOW (ref 22–32)
Calcium: 9 mg/dL (ref 8.9–10.3)
Chloride: 110 mmol/L (ref 98–111)
Creatinine, Ser: 1.73 mg/dL — ABNORMAL HIGH (ref 0.61–1.24)
GFR, Estimated: 45 mL/min — ABNORMAL LOW (ref >60)
Glucose, Bld: 93 mg/dL (ref 70–99)
Potassium: 3.5 mmol/L (ref 3.5–5.1)
Sodium: 141 mmol/L (ref 135–145)

## 2022-02-17 LAB — CBC
HCT: 46.4 % (ref 39.0–52.0)
Hemoglobin: 14.4 g/dL (ref 13.0–17.0)
MCH: 26.5 pg (ref 26.0–34.0)
MCHC: 31 g/dL (ref 30.0–36.0)
MCV: 85.3 fL (ref 80.0–100.0)
Platelets: 185 10*3/uL (ref 150–400)
RBC: 5.44 MIL/uL (ref 4.22–5.81)
RDW: 14.3 % (ref 11.5–15.5)
WBC: 6.4 10*3/uL (ref 4.0–10.5)
nRBC: 0 % (ref 0.0–0.2)

## 2022-02-17 LAB — LACTATE DEHYDROGENASE: LDH: 189 U/L (ref 98–192)

## 2022-02-17 LAB — PROTIME-INR
INR: 1.9 — ABNORMAL HIGH (ref 0.8–1.2)
Prothrombin Time: 21.5 seconds — ABNORMAL HIGH (ref 11.4–15.2)

## 2022-02-17 MED ORDER — AMLODIPINE BESYLATE 5 MG PO TABS: 5.0000 mg | ORAL_TABLET | Freq: Every day | ORAL | 3 refills | Status: DC

## 2022-02-17 NOTE — Progress Notes (Signed)
Patient presents for 2 mo follow up in [facility] today with David [name]) wife. No complaints from patient.   Patient's confirms that he increased Losartan to 100 mg per Dr David [name] at his last visit. Pt purchased a BP cuff for home use. Pt brought home cuff to clinic in order to compare clinic BP with home BP.  Pt states that he is doing well and denies any headaches.  BP slightly better but MAPs still 100. Will add Amlodipine 5 mg daily per Dr David [name].  Vital Signs:  Doppler Pressure: 115 Automatc BP: 122/82 (100) home cuff: 108/80 HR: 73 NSR SPO2: 96    Weight: 161.8 lb w/ eqt Last weight: 162.6 lb  VAD Indication: Destination therapy- CKD   VAD interrogation & Equipment Management: Speed: 5400 [name]: 4.6 Power: 4.2w    PI: 5.1   Alarms: none Events: 5-10 PI events daily  Fixed speed 5400 Low speed limit: 5100   Primary Controller:  Replace back up battery in 25 months Back up controller:  Replace back up battery in 28 mths   Annual Equipment Maintenance on UBC/PM was performed on 08/03/21.   I reviewed the LVAD parameters from today and compared the results to the patient's prior recorded data. LVAD interrogation was NEGATIVE for significant power changes, negative for clinical alarms and STABLE for PI events/speed drops. No programming changes were made and pump is functioning within specified parameters. Pt is performing daily controller and system monitor self tests along with completing weekly and monthly maintenance for LVAD equipment.   LVAD equipment check completed and is in good working order. Back up equipment not present.     Exit Site Care: Drive line is being maintained weekly by David [name], his wife. Exit site healed and incorporated, the velour is fully implanted at exit site. No redness, drainage or foul odor noted. Pt denies fever or chills. Pt given 8 weekly dressings for home use and 8 anchors and some adhesive remover.  Device: Medtronic Bi-V Therapies:  on 167 Pacing:  VVI 50 Last check:  07/28/21   BP & Labs:  Doppler BP 115 - Doppler is reflecting Modified systolic   Hgb 27.7 - No S/S of bleeding. Specifically denies melena/BRBPR or nosebleeds.  LDH stable at 189 with established baseline of 165- 250. Denies tea-colored urine. No power elevations noted on interrogation.        Patient Instructions:  Start Amlodipine 5 mg daily Coumadin dosing per David [name]-pharm D Return to clinic in 2 months  David Rockers RN [place] Coordinator  Office: [phone]  24/7 Pager: [phone]

## 2022-02-17 NOTE — Patient Instructions (Signed)
Start Amlodipine 5 mg daily Coumadin dosing per Lauren-pharm D Return to clinic in 2 months

## 2022-02-17 NOTE — Progress Notes (Signed)
LVAD Clinic Follow Up Note HF MD: Dr Haroldine Laws  HPI: David [name] is a 56 y.o. male with history of ICD (09 PCI to LAD, mRCA, '10 PCI to Lcx and '12 mRCA), HFrEF (EF ~20% for > 5 years), HLD and HTN. S/P LVAD HM-III 04/20/18.    Admitted 04/19/17 - 05/27/2017 for scheduled LVAD implant. On 12/27 he underwent HMIII LVAD implant. Hospital course c/b PNA shock liver and AKI. Required short term HD.  Readmitted 05/30/17 hgb 3.8. INR 2.9.transfused 4u pRBCs. 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 Underwent ventriculostomy. Admission c/b by periods of confusion and urinary retention. Discharged to [place] for routine f/u with his wife. Feels good. No further HAs. Active with no CP or SOB. Following BP at home. Denies orthopnea or PND. No fevers, chills or problems with driveline. No bleeding, melena or neuro symptoms. No VAD alarms. Taking all meds as prescribed.      VAD Indication: Destination therapy- CKD   VAD interrogation & Equipment Management: Speed: 5400 Flow: 4.6 Power: 4.2w    PI: 5.1   Alarms: none Events: 5-10 PI events daily  Fixed speed 5400 Low speed limit: 5100   Primary Controller:  Replace back up battery in 25 months Back up controller:  Replace back up battery in 28 mths   Annual Equipment Maintenance on UBC/PM was performed on 08/03/21.   I reviewed the LVAD parameters from today and compared the results to the patient's prior recorded data. LVAD interrogation was NEGATIVE for significant power changes, negative for clinical alarms and STABLE for PI events/speed drops. No programming changes were made and pump is functioning within specified parameters. Pt is performing daily controller and system monitor self tests along with completing weekly and monthly maintenance for LVAD equipment.   LVAD equipment check  completed and is in good working order. Back up equipment not present.     Exit Site Care: Drive line is being maintained weekly by [name], his wife. Exit site healed and incorporated, the velour is fully implanted at exit site. No redness, drainage or foul odor noted.  Past Medical History:  Diagnosis Date   AICD (automatic cardioverter/defibrillator) present    Anemia    Anxiety    CAD (coronary artery disease) 2009   AMI in 12/2007 with PCI to LAD, staged PCI to  mid/distal RCA, NSTEMI in 02/2009 with BMS to LCx   CHF (congestive heart failure) ([place])    Chronic kidney disease 11/03/2016   stage 3 kidney disease   Dysrhythmia    "arrythmia problems at some point", "fatal rhythms"   History of blood transfusion    "I was bleeding from was rectum" (04/19/2017)   HLD (hyperlipidemia)    HTN (hypertension)    Hyperlipidemia 03/15/2011   Ischemic cardiomyopathy    Admitted in 07/2010 with CHF exacerbation   Myocardial infarction ([place])    "I've had 4" (04/19/2017)   Pneumonia 2018 X 1   Seizures ([place])    one seizure in 04/2016 during cardiac event (04/19/2017)   STEMI 2009 (anterior), 2010 (lateral), and 2012 (inferior) 03/14/2011    Current Outpatient Medications  Medication Sig Dispense Refill   amiodarone (PACERONE) 200 MG tablet Take 1 tablet (200 mg total) by mouth daily. 90 tablet 3   amLODipine (NORVASC) 5 MG tablet Take 1 tablet (5 mg  total) by mouth daily. 90 tablet 3   atorvastatin (LIPITOR) 40 MG tablet Take 1 tablet (40 mg total) by mouth daily. 90 tablet 6   Blood Pressure Monitoring (BLOOD PRESSURE CUFF) MISC 1 Device by Does not apply route daily. 1 each 0   fluticasone (FLONASE) 50 MCG/ACT nasal spray Place 2 sprays into both nostrils daily. 9.9 mL 3   losartan (COZAAR) 50 MG tablet Take 2 tablets (100 mg total) by mouth daily. 180 tablet 3   magnesium oxide (MAG-OX) 400 (240 Mg) MG tablet Take 1 tablet (400 mg total) by mouth 2 (two) times daily. 60 tablet 0    Mouthwashes (MOUTH RINSE) LIQD solution 15 mLs by Mouth Rinse route 2 (two) times daily.  0   Multiple Vitamin (MULTIVITAMIN WITH MINERALS) TABS tablet Take 1 tablet by mouth daily. 30 tablet 6   pantoprazole (PROTONIX) 40 MG tablet Take 1 tablet (40 mg total) by mouth daily. 90 tablet 6   polyethylene glycol (MIRALAX / GLYCOLAX) 17 g packet Take 17 g by mouth daily. 30 each 0   tamsulosin (FLOMAX) 0.4 MG CAPS capsule Take 1 capsule (0.4 mg total) by mouth daily. 90 capsule 3   topiramate (TOPAMAX) 50 MG tablet Take 1 tablet by mouth twice daily 60 tablet 0   traZODone (DESYREL) 50 MG tablet Take 1 tablet (50 mg total) by mouth at bedtime. 90 tablet 3   warfarin (COUMADIN) 2 MG tablet Take 2 mg (1 tablet) every Sun, MWF and 1 mg (0.5 tab) all other days or as directed by HF Clinic. 30 tablet 11   acetaminophen (TYLENOL) 325 MG tablet Take 2 tablets (650 mg total) by mouth every 4 (four) hours as needed for headache or mild pain. (Patient not taking: Reported on 10/11/2021)     polyvinyl alcohol (LIQUIFILM TEARS) 1.4 % ophthalmic solution Place 2 drops into both eyes as needed for dry eyes. (Patient not taking: Reported on 10/11/2021) 15 mL 0   senna (SENOKOT) 8.6 MG TABS tablet Take 2 tablets (17.2 mg total) by mouth at bedtime. (Patient not taking: Reported on 10/11/2021) 120 tablet 0   No current facility-administered medications for this encounter.    Plavix [clopidogrel bisulfate]  Review of systems complete and found to be negative unless listed in HPI.      Vitals:   02/17/22 1257 02/17/22 1303  BP: 122/82 (!) 115/0  Pulse: 73   SpO2: 96%      Wt Readings from Last 3 Encounters:  02/17/22 73.4 kg (161 lb 12.8 oz)  12/13/21 73.8 kg (162 lb 9.6 oz)  10/11/21 74.4 kg (164 lb)    Vital Signs:  Doppler Pressure: 115 Automatc BP: 122/82 (100) home cuff: 108/80 HR: 73 NSR SPO2: 96    Weight: 161.8 lb w/ eqt Last weight: 162.6 lb    Physical Exam: General:  NAD.  HEENT:  normal  Neck: supple. JVP not elevated.  Carotids 2+ bilat; no bruits. No lymphadenopathy or thryomegaly appreciated. Cor: LVAD hum.  Lungs: Clear. Abdomen: soft, nontender, non-distended. No hepatosplenomegaly. No bruits or masses. Good bowel sounds. Driveline site clean. Anchor in place.  Extremities: no cyanosis, clubbing, rash. Warm no edema  Neuro: alert & oriented x 3. No focal deficits. Moves all 4 without problem    ASSESSMENT AND PLAN:  1. SAH - admit in 2/23 with [facility] requiring ventriculostomy - much improved. HAs resolved.  - BP still up. Add amlodipine 5  2. Chronic systolic CHF with HM III LVAD  implant 04/20/17:  - Overall doing well with VAD support. - NYHA I-II - Volume status looks good - He has been [facility] at [facility] for transplant referral but has decided not to pursue VAD.   3. AFL with RVR - had DC-CV on 10/17/18 with subsequent short episodes of AF after but has been mostly in NSR on low-dose amio (100 daily) - Had recurrent AF in 10/22. Amio increased to 200 bid and converted.  - Remains in NSR. Continue amio 200 daily. Consider dropping to 100 daily at next visit   4. h/o UGI AVM bleed - s/p APC x 4 lesions in 2/19 - No evidence of recurrent bleeding - Continue PPI - Off ASA due to GIB.  - Hgb stable at 14.4  5. CKD Stage IIIa:  - Creatinine baseline 1.3 - 1.7  - Creatinine 1.7 today  - Continue to follow - Consider SGLT2i  6. VAD management - VAD interrogated personally. Parameters stable. - LDH 189 - INR goal 1.5-2.0 with h/o SAH Off ASA   - INR 1.9 Discussed dosing with PharmD personally. - BP remains high. Add amlodipine 5 - DL site ok  7. CAD:  s/p PCI RCA/PLOM with DES x2 and DES to mild LAD 1/18 - No s/s angina - Continue statin. Off ASA due to GIB  8. Hypomagnesemia. -  Mag 1.9 Continue mag supplements  9. HTN - needs tight BP control with h/o SAH.  - BP still elevated. Adding amlodipine 5  10. [employer] fan - unfortunately this  has been incurable even with VAD implant - Judson Roch still working on getting him a Immunologist and a Statistician - He is now rooting for [employer]!!!   Total time spent 35 minutes. Over half that time spent discussing above.    Glori Bickers, MD  3:00 PM

## 2022-02-21 ENCOUNTER — Encounter (HOSPITAL_COMMUNITY): Payer: Self-pay

## 2022-02-21 ENCOUNTER — Ambulatory Visit (HOSPITAL_COMMUNITY): Payer: Self-pay | Admitting: Pharmacist

## 2022-02-21 LAB — POCT INR: INR: 1.8 — AB (ref 2.0–3.0)

## 2022-02-21 NOTE — Progress Notes (Signed)
[name] paged the [place] pager in regards to home BP cuff having variability. She stated blood pressure is reading low with systolic's in the 62'B-63'S. Pt denies lightheadedness, dizziness, falls, shortness of breath or any alarms on VAD. Amlodipine 5mg  once daily added at last weeks visit.   VAD Coordinator noted variation of 20 points in home cuff versus automatic cuff in clinic. Will schedule pt for BP check 03/02/22 at 10:30. Asked to bring home BP machine again to compare results.   Pt and wife educated on signs and symptoms of hypotension and when to call back if concerned they verbalized understanding. Dr. Haroldine Laws made aware.  Bobbye Morton RN, BSN VAD Coordinator 24/7 Pager [phone]

## 2022-02-28 ENCOUNTER — Ambulatory Visit (HOSPITAL_COMMUNITY): Payer: Self-pay | Admitting: Pharmacist

## 2022-02-28 LAB — POCT INR: INR: 1.4 — AB (ref 2.0–3.0)

## 2022-03-02 ENCOUNTER — Other Ambulatory Visit (HOSPITAL_COMMUNITY): Payer: Medicare HMO

## 2022-03-02 ENCOUNTER — Telehealth (HOSPITAL_COMMUNITY): Payer: Self-pay | Admitting: *Deleted

## 2022-03-02 NOTE — Telephone Encounter (Signed)
Pt's wife called and canceled BP check for today. States pt is doing great and she does not feel he needs to be seen. She will call if any issues arise with pt.   Emerson Monte RN [place] Coordinator  Office: [phone]  24/7 Pager: [phone]

## 2022-03-07 ENCOUNTER — Ambulatory Visit (HOSPITAL_COMMUNITY): Payer: Self-pay | Admitting: Pharmacist

## 2022-03-07 LAB — POCT INR: INR: 1.8 — AB (ref 2.0–3.0)

## 2022-03-14 ENCOUNTER — Ambulatory Visit (HOSPITAL_COMMUNITY): Payer: Self-pay | Admitting: Pharmacist

## 2022-03-14 LAB — POCT INR: INR: 1.7 — AB (ref 2.0–3.0)

## 2022-03-21 ENCOUNTER — Ambulatory Visit (HOSPITAL_COMMUNITY): Payer: Self-pay | Admitting: Pharmacist

## 2022-03-21 LAB — POCT INR: INR: 1.9 — AB (ref 2.0–3.0)

## 2022-03-28 ENCOUNTER — Ambulatory Visit (HOSPITAL_COMMUNITY): Payer: Self-pay | Admitting: Pharmacist

## 2022-03-28 LAB — POCT INR: INR: 1.5 — AB (ref 2.0–3.0)

## 2022-04-04 ENCOUNTER — Ambulatory Visit (HOSPITAL_COMMUNITY): Payer: Self-pay | Admitting: Pharmacist

## 2022-04-04 LAB — POCT INR: INR: 1.7 — AB (ref 2.0–3.0)

## 2022-04-11 ENCOUNTER — Ambulatory Visit (HOSPITAL_COMMUNITY): Payer: Self-pay | Admitting: Pharmacist

## 2022-04-11 LAB — POCT INR: INR: 1.5 — AB (ref 2.0–3.0)

## 2022-04-19 ENCOUNTER — Other Ambulatory Visit (HOSPITAL_COMMUNITY): Payer: Self-pay | Admitting: Unknown Physician Specialty

## 2022-04-19 DIAGNOSIS — Z7901 Long term (current) use of anticoagulants: Secondary | ICD-10-CM

## 2022-04-19 DIAGNOSIS — Z79899 Other long term (current) drug therapy: Secondary | ICD-10-CM

## 2022-04-19 DIAGNOSIS — Z95811 Presence of heart assist device: Secondary | ICD-10-CM

## 2022-04-20 ENCOUNTER — Encounter (HOSPITAL_COMMUNITY): Payer: Medicare HMO

## 2022-04-20 ENCOUNTER — Ambulatory Visit (HOSPITAL_COMMUNITY): Payer: Self-pay | Admitting: Pharmacist

## 2022-04-20 LAB — POCT INR: INR: 1.6 — AB (ref 2.0–3.0)

## 2022-04-26 ENCOUNTER — Ambulatory Visit (HOSPITAL_COMMUNITY): Payer: Self-pay | Admitting: Pharmacist

## 2022-04-26 LAB — POCT INR: INR: 1.7 — AB (ref 2.0–3.0)

## 2022-04-27 ENCOUNTER — Ambulatory Visit (INDEPENDENT_AMBULATORY_CARE_PROVIDER_SITE_OTHER): Payer: Medicare HMO

## 2022-04-27 DIAGNOSIS — I255 Ischemic cardiomyopathy: Secondary | ICD-10-CM | POA: Diagnosis not present

## 2022-04-27 LAB — CUP PACEART REMOTE DEVICE CHECK
Battery Remaining Longevity: 33 mo
Battery Voltage: 2.95 V
Brady Statistic AP VP Percent: 0 %
Brady Statistic AP VS Percent: 0 %
Brady Statistic AS VP Percent: 0.53 %
Brady Statistic AS VS Percent: 99.47 %
Brady Statistic RA Percent Paced: 0 %
Brady Statistic RV Percent Paced: 1.43 %
Date Time Interrogation Session: 20240103082608
HighPow Impedance: 64 Ohm
Implantable Lead Connection Status: 753985
Implantable Lead Connection Status: 753985
Implantable Lead Connection Status: 753985
Implantable Lead Connection Status: 753985
Implantable Lead Implant Date: 20180409
Implantable Lead Implant Date: 20180409
Implantable Lead Implant Date: 20180716
Implantable Lead Implant Date: 20180716
Implantable Lead Location: 753858
Implantable Lead Location: 753858
Implantable Lead Location: 753859
Implantable Lead Location: 753860
Implantable Lead Model: 5076
Implantable Lead Model: 511212
Implantable Lead Model: 511212
Implantable Lead Serial Number: 263773
Implantable Lead Serial Number: 263775
Implantable Pulse Generator Implant Date: 20180409
Lead Channel Impedance Value: 266 Ohm
Lead Channel Impedance Value: 323 Ohm
Lead Channel Impedance Value: 3439 Ohm
Lead Channel Impedance Value: 380 Ohm
Lead Channel Impedance Value: 4047 Ohm
Lead Channel Impedance Value: 969 Ohm
Lead Channel Pacing Threshold Amplitude: 0.625 V
Lead Channel Pacing Threshold Amplitude: 1.125 V
Lead Channel Pacing Threshold Amplitude: 1.75 V
Lead Channel Pacing Threshold Pulse Width: 0.4 ms
Lead Channel Pacing Threshold Pulse Width: 0.4 ms
Lead Channel Pacing Threshold Pulse Width: 1 ms
Lead Channel Sensing Intrinsic Amplitude: 10.5 mV
Lead Channel Sensing Intrinsic Amplitude: 10.5 mV
Lead Channel Sensing Intrinsic Amplitude: 3.125 mV
Lead Channel Sensing Intrinsic Amplitude: 3.125 mV
Lead Channel Setting Pacing Amplitude: 2.5 V
Lead Channel Setting Pacing Pulse Width: 0.4 ms
Lead Channel Setting Sensing Sensitivity: 0.6 mV
Zone Setting Status: 755011

## 2022-05-02 ENCOUNTER — Ambulatory Visit (HOSPITAL_COMMUNITY): Payer: Self-pay | Admitting: Pharmacist

## 2022-05-02 LAB — POCT INR: INR: 1.8 — AB (ref 2.0–3.0)

## 2022-05-09 ENCOUNTER — Ambulatory Visit (HOSPITAL_COMMUNITY): Payer: Self-pay | Admitting: Pharmacist

## 2022-05-09 LAB — POCT INR: INR: 2.3 (ref 2.0–3.0)

## 2022-05-10 ENCOUNTER — Ambulatory Visit (HOSPITAL_COMMUNITY)
Admission: RE | Admit: 2022-05-10 | Discharge: 2022-05-10 | Disposition: A | Discharge status: Home / Self Care | Payer: Medicare HMO | Source: Ambulatory Visit | Attending: Internal Medicine | Admitting: Internal Medicine

## 2022-05-10 ENCOUNTER — Encounter (HOSPITAL_COMMUNITY): Payer: Self-pay | Admitting: Internal Medicine

## 2022-05-10 VITALS — BP 98/0 | HR 69 | Wt 162.6 lb

## 2022-05-10 DIAGNOSIS — I609 Nontraumatic subarachnoid hemorrhage, unspecified: Secondary | ICD-10-CM

## 2022-05-10 DIAGNOSIS — I5022 Chronic systolic (congestive) heart failure: Secondary | ICD-10-CM

## 2022-05-10 DIAGNOSIS — Z95811 Presence of heart assist device: Secondary | ICD-10-CM | POA: Insufficient documentation

## 2022-05-10 DIAGNOSIS — Z7901 Long term (current) use of anticoagulants: Secondary | ICD-10-CM | POA: Diagnosis not present

## 2022-05-10 DIAGNOSIS — Z79899 Other long term (current) drug therapy: Secondary | ICD-10-CM | POA: Insufficient documentation

## 2022-05-10 DIAGNOSIS — I2510 Atherosclerotic heart disease of native coronary artery without angina pectoris: Secondary | ICD-10-CM

## 2022-05-10 DIAGNOSIS — I10 Essential (primary) hypertension: Secondary | ICD-10-CM

## 2022-05-10 LAB — COMPREHENSIVE METABOLIC PANEL
ALT: 22 U/L (ref 0–44)
AST: 25 U/L (ref 15–41)
Albumin: 3.3 g/dL — ABNORMAL LOW (ref 3.5–5.0)
Alkaline Phosphatase: 100 U/L (ref 38–126)
Anion gap: 9 (ref 5–15)
BUN: 15 mg/dL (ref 6–20)
CO2: 24 mmol/L (ref 22–32)
Calcium: 8.8 mg/dL — ABNORMAL LOW (ref 8.9–10.3)
Chloride: 104 mmol/L (ref 98–111)
Creatinine, Ser: 1.5 mg/dL — ABNORMAL HIGH (ref 0.61–1.24)
GFR, Estimated: 54 mL/min — ABNORMAL LOW (ref >60)
Glucose, Bld: 91 mg/dL (ref 70–99)
Potassium: 3.9 mmol/L (ref 3.5–5.1)
Sodium: 137 mmol/L (ref 135–145)
Total Bilirubin: 0.7 mg/dL (ref 0.3–1.2)
Total Protein: 7.1 g/dL (ref 6.5–8.1)

## 2022-05-10 LAB — PREALBUMIN: Prealbumin: 33 mg/dL (ref 18–38)

## 2022-05-10 LAB — PROTIME-INR
INR: 2.1 — ABNORMAL HIGH (ref 0.8–1.2)
Prothrombin Time: 23.3 seconds — ABNORMAL HIGH (ref 11.4–15.2)

## 2022-05-10 LAB — CBC
HCT: 42.7 % (ref 39.0–52.0)
Hemoglobin: 13.6 g/dL (ref 13.0–17.0)
MCH: 26.8 pg (ref 26.0–34.0)
MCHC: 31.9 g/dL (ref 30.0–36.0)
MCV: 84.2 fL (ref 80.0–100.0)
Platelets: 184 10*3/uL (ref 150–400)
RBC: 5.07 MIL/uL (ref 4.22–5.81)
RDW: 13.5 % (ref 11.5–15.5)
WBC: 7.1 10*3/uL (ref 4.0–10.5)
nRBC: 0 % (ref 0.0–0.2)

## 2022-05-10 LAB — LACTATE DEHYDROGENASE: LDH: 187 U/L (ref 98–192)

## 2022-05-10 LAB — MAGNESIUM: Magnesium: 1.8 mg/dL (ref 1.7–2.4)

## 2022-05-10 LAB — TSH: TSH: 1.916 u[IU]/mL (ref 0.350–4.500)

## 2022-05-10 LAB — T4, FREE: Free T4: 1.15 ng/dL — ABNORMAL HIGH (ref 0.61–1.12)

## 2022-05-10 NOTE — Progress Notes (Signed)
Patient presents for 2 mo follow up with 5.5 year Intermacs and Annual Maintenance in [facility] today with [name]) wife. No complaints from patient.   Patient confirms that he is taking his Amlodipine 5mg  daily that was added at his last visit. Currently not using home BP cuff due to inconsistency. Pt denies lightheadedness, dizziness, falls, shortness of breath, and signs of bleeding.   Patient states he continues to try to stay active and is hoping to increase his activity this year.  Vital Signs:  Doppler Pressure: 98 Automatc BP: 99/66 (81) HR: 69 NSR SPO2: 96    Weight: 162.6 lb w/ eqt Last weight: 161.8 lb w/ eqt  VAD Indication: Destination therapy- CKD   VAD interrogation & Equipment Management: Speed: 5400 Flow: 4.6 Power: 4.1w    PI: 4.2   Alarms: none Events: 5-10 PI events daily  Fixed speed 5400 Low speed limit: 5100   Primary Controller:  Replace back up battery in 22 months Back up controller:  Replace back up battery in 19 mths   Annual Equipment Maintenance on UBC/PM was performed on today 05/10/2022.   I reviewed the LVAD parameters from today and compared the results to the patient's prior recorded data. LVAD interrogation was NEGATIVE for significant power changes, negative for clinical alarms and STABLE for PI events/speed drops. No programming changes were made and pump is functioning within specified parameters. Pt is performing daily controller and system monitor self tests along with completing weekly and monthly maintenance for LVAD equipment.   LVAD equipment check completed and is in good working order. Back up equipment charged at this appointment.  Patient completed 1400 feet during 6 minute walk test. Tolerated well.   Neurocognitive trail making completed correctly in 2 m 23 s.   [address] Cardiomyopathy, EQ-5D-3L and post-VAD QOL completed by the patient independently.  [place] Cardiomyopathy Questionnaire     05/10/2022    2:30  PM 10/11/2021   11:31 AM 05/03/2021   12:52 PM  KCCQ-12  1 a. Ability to shower/bathe Not at all limited Not at all limited Not at all limited  1 b. Ability to walk 1 block Not at all limited Not at all limited Not at all limited  1 c. Ability to hurry/jog Not at all limited Slightly limited Slightly limited  2. Edema feet/ankles/legs Never over the past 2 weeks Never over the past 2 weeks Never over the past 2 weeks  3. Limited by fatigue Never over the past 2 weeks Never over the past 2 weeks Never over the past 2 weeks  4. Limited by dyspnea Never over the past 2 weeks Never over the past 2 weeks Never over the past 2 weeks  5. Sitting up / on 3+ pillows Never over the past 2 weeks Never over the past 2 weeks Never over the past 2 weeks  6. Limited enjoyment of life Not limited at all Slightly limited Moderately limited  7. Rest of life w/ symptoms Completely satisfied Mostly satisfied Mostly satisfied  8 a. Participation in hobbies Did not limit at all Limited quite a bit Limited quite a bit  8 b. Participation in chores Did not limit at all Severely limited Moderately limited  8 c. Visiting family/friends Did not limit at all Did not limit at all Did not limit at all      Exit Site Care: Drive line is being maintained weekly by [name], his wife. Exit site healed and incorporated, the velour is fully implanted  at exit site. No redness, drainage or foul odor noted. Pt denies fever or chills. Pt given 8 weekly dressings for home use and 8 anchors and some adhesive remover.  Device: Medtronic Bi-V Therapies: on 167 Pacing:  VVI 50 Last check: 01/26/22  BP & Labs:  Doppler BP 98 - Doppler is reflecting Modified systolic   Hgb 23.7 - No S/S of bleeding. Specifically denies melena/BRBPR or nosebleeds.  LDH stable at 187 with established baseline of 165- 250. Denies tea-colored urine. No power elevations noted on interrogation.    Patient Instructions:  No medication changes  today Coumadin dosing per Lauren-pharm D Return to clinic in 2 months  Bobbye Morton RN,BSN [place] Coordinator  Office: [phone]  24/7 Pager: [phone]

## 2022-05-10 NOTE — Patient Instructions (Signed)
No medication changes today Follow up in [place] [facility] in 2 months  Coumadin dosing per An[facility]

## 2022-05-11 ENCOUNTER — Other Ambulatory Visit (HOSPITAL_COMMUNITY): Payer: Self-pay

## 2022-05-11 DIAGNOSIS — I5022 Chronic systolic (congestive) heart failure: Secondary | ICD-10-CM

## 2022-05-11 DIAGNOSIS — Z95811 Presence of heart assist device: Secondary | ICD-10-CM

## 2022-05-11 LAB — T3, FREE: T3, Free: 2.6 pg/mL (ref 2.0–4.4)

## 2022-05-14 NOTE — Progress Notes (Signed)
LVAD Clinic Follow Up Note HF MD: Dr Haroldine Laws  HPI: David [name] is a 57 y.o. male with history of ICD (09 PCI to LAD, mRCA, '10 PCI to Lcx and '12 mRCA), HFrEF (EF ~20% for > 5 years), HLD and HTN. S/P LVAD HM-III 04/20/18.    Admitted 04/19/17 - 05/27/2017 for scheduled LVAD implant. On 12/27 he underwent HMIII LVAD implant. Hospital course c/b PNA shock liver and AKI. Required short term HD.  Readmitted 05/30/17 hgb 3.8. INR 2.9.transfused 4u pRBCs. 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 Underwent ventriculostomy. Admission c/b by periods of confusion and urinary retention. Discharged to [place] for routine f/u with his wife. Feels great. Active. Denies SOB, edema, orthopnea or PND. Denies orthopnea or PND. No fevers, chills or problems with driveline. No bleeding, melena or neuro symptoms. No VAD alarms. Taking all meds as prescribed.    VAD Indication: Destination therapy- CKD   VAD interrogation & Equipment Management: Speed: 5400 Flow: 4.6 Power: 4.1w    PI: 4.2   Alarms: none Events: 5-10 PI events daily  Fixed speed 5400 Low speed limit: 5100   Primary Controller:  Replace back up battery in 22 months Back up controller:  Replace back up battery in 19 mths   Annual Equipment Maintenance on UBC/PM was performed on today 05/10/2022.   I reviewed the LVAD parameters from today and compared the results to the patient's prior recorded data. LVAD interrogation was NEGATIVE for significant power changes, negative for clinical alarms and STABLE for PI events/speed drops. No programming changes were made and pump is functioning within specified parameters. Pt is performing daily controller and system monitor self tests along with completing weekly and monthly maintenance for LVAD equipment.   LVAD equipment check completed and is  in good working order. Back up equipment not present.     Exit Site Care: Drive line is being maintained weekly by [name], his wife. Exit site healed and incorporated, the velour is fully implanted at exit site. No redness, drainage or foul odor noted.  Past Medical History:  Diagnosis Date   AICD (automatic cardioverter/defibrillator) present    Anemia    Anxiety    CAD (coronary artery disease) 2009   AMI in 12/2007 with PCI to LAD, staged PCI to  mid/distal RCA, NSTEMI in 02/2009 with BMS to LCx   CHF (congestive heart failure) ([place])    Chronic kidney disease 11/03/2016   stage 3 kidney disease   Dysrhythmia    "arrythmia problems at some point", "fatal rhythms"   History of blood transfusion    "I was bleeding from was rectum" (04/19/2017)   HLD (hyperlipidemia)    HTN (hypertension)    Hyperlipidemia 03/15/2011   Ischemic cardiomyopathy    Admitted in 07/2010 with CHF exacerbation   Myocardial infarction ([place])    "I've had 4" (04/19/2017)   Pneumonia 2018 X 1   Seizures ([place])    one seizure in 04/2016 during cardiac event (04/19/2017)   STEMI 2009 (anterior), 2010 (lateral), and 2012 (inferior) 03/14/2011    Current Outpatient Medications  Medication Sig Dispense Refill   amiodarone (PACERONE) 200 MG tablet Take 1 tablet (200 mg total) by mouth daily. 90 tablet 3   amLODipine (NORVASC) 5 MG tablet Take 1 tablet (5 mg total) by mouth daily. 90 tablet 3  atorvastatin (LIPITOR) 40 MG tablet Take 1 tablet (40 mg total) by mouth daily. 90 tablet 6   fluticasone (FLONASE) 50 MCG/ACT nasal spray Place 2 sprays into both nostrils daily. 9.9 mL 3   losartan (COZAAR) 50 MG tablet Take 2 tablets (100 mg total) by mouth daily. 180 tablet 3   magnesium oxide (MAG-OX) 400 (240 Mg) MG tablet Take 1 tablet (400 mg total) by mouth 2 (two) times daily. 60 tablet 0   Mouthwashes (MOUTH RINSE) LIQD solution 15 mLs by Mouth Rinse route 2 (two) times daily.  0   Multiple Vitamin (MULTIVITAMIN  WITH MINERALS) TABS tablet Take 1 tablet by mouth daily. 30 tablet 6   pantoprazole (PROTONIX) 40 MG tablet Take 1 tablet (40 mg total) by mouth daily. 90 tablet 6   polyethylene glycol (MIRALAX / GLYCOLAX) 17 g packet Take 17 g by mouth daily. 30 each 0   senna (SENOKOT) 8.6 MG TABS tablet Take 2 tablets (17.2 mg total) by mouth at bedtime. 120 tablet 0   tamsulosin (FLOMAX) 0.4 MG CAPS capsule Take 1 capsule (0.4 mg total) by mouth daily. 90 capsule 3   traZODone (DESYREL) 50 MG tablet Take 1 tablet (50 mg total) by mouth at bedtime. 90 tablet 3   warfarin (COUMADIN) 2 MG tablet Take 2 mg (1 tablet) every Sun, MWF and 1 mg (0.5 tab) all other days or as directed by HF Clinic. 30 tablet 11   acetaminophen (TYLENOL) 325 MG tablet Take 2 tablets (650 mg total) by mouth every 4 (four) hours as needed for headache or mild pain. (Patient not taking: Reported on 10/11/2021)     Blood Pressure Monitoring (BLOOD PRESSURE CUFF) MISC 1 Device by Does not apply route daily. (Patient not taking: Reported on 05/10/2022) 1 each 0   polyvinyl alcohol (LIQUIFILM TEARS) 1.4 % ophthalmic solution Place 2 drops into both eyes as needed for dry eyes. (Patient not taking: Reported on 05/10/2022) 15 mL 0   topiramate (TOPAMAX) 50 MG tablet Take 1 tablet by mouth twice daily (Patient not taking: Reported on 05/10/2022) 60 tablet 0   No current facility-administered medications for this encounter.    Plavix [clopidogrel bisulfate]  Review of systems complete and found to be negative unless listed in HPI.      Vitals:   05/10/22 1433 05/10/22 1434  BP: 99/66 (!) 98/0  Pulse: 69   SpO2: 96%      Wt Readings from Last 3 Encounters:  05/10/22 73.8 kg (162 lb 9.6 oz)  02/17/22 73.4 kg (161 lb 12.8 oz)  12/13/21 73.8 kg (162 lb 9.6 oz)     Vital Signs:  Doppler Pressure: 98 Automatc BP: 99/66 (81) HR: 69 NSR SPO2: 96    Weight: 162.6 lb w/ eqt Last weight: 161.8 lb w/ eqt    Physical Exam: General:   NAD.  HEENT: normal  Neck: supple. JVP not elevated.  Carotids 2+ bilat; no bruits. No lymphadenopathy or thryomegaly appreciated. Cor: LVAD hum.  Lungs: Clear. Abdomen:  soft, nontender, non-distended. No hepatosplenomegaly. No bruits or masses. Good bowel sounds. Driveline site clean. Anchor in place.  Extremities: no cyanosis, clubbing, rash. Warm no edema  Neuro: alert & oriented x 3. No focal deficits. Moves all 4 without problem    ASSESSMENT AND PLAN:  1. SAH - admit in 2/23 with [facility] requiring ventriculostomy - much improved. HAs resolved.  - BP well controlled  2. Chronic systolic CHF with HM III LVAD implant 04/20/17:  -  Doing great - NYHA I - Volume status ok - He has been seen at [facility] for transplant referral but has decided not to pursue VAD.   3. AFL with RVR - had DC-CV on 10/17/18 with subsequent short episodes of AF after but has been mostly in NSR on low-dose amio (100 daily) - Had recurrent AF in 10/22. Amio increased to 200 bid and converted.  - Remains in NSR. Continue amio 200 daily. Consider dropping to 100 daily at next visit   4. h/o UGI AVM bleed - s/p APC x 4 lesions in 2/19 - No evidence of recurrent bleeding - Continue PPI - Off ASA due to GIB.  - Hgb 13.6  5. CKD Stage IIIa:  - Creatinine baseline 1.3 - 1.7  - Creatinine 1.5 today  - Continue to follow - Consider SGLT2i  6. VAD management - VAD interrogated personally. Parameters stable. - LDH 187 - INR goal 1.5-2.0 with h/o SAH Off ASA   - INR  2.1 Discussed dosing with PharmD personally. - MAPs improved control  - DL looks good  7. CAD:  s/p PCI RCA/PLOM with DES x2 and DES to mild LAD 1/18 - No s/s angina - Continue statin. Off ASA due to GIB  8. Hypomagnesemia. -  Mag 1.9 Continue mag supplements  9. HTN - needs tight BP control with h/o SAH.  - Much improved with addition of amlodipine  10. [employer] fan - unfortunately this has been incurable even with VAD implant -  Judson Roch still working on getting him a [employer] hat and a t-shirt - March Madness is coming up!!  Total time spent 35 minutes. Over half that time spent discussing above.    Glori Bickers, MD  9:09 PM

## 2022-05-16 ENCOUNTER — Ambulatory Visit (HOSPITAL_COMMUNITY): Payer: Self-pay | Admitting: Pharmacist

## 2022-05-16 LAB — POCT INR: INR: 1.9 — AB (ref 2.0–3.0)

## 2022-05-23 ENCOUNTER — Ambulatory Visit (HOSPITAL_COMMUNITY): Payer: Self-pay | Admitting: Pharmacist

## 2022-05-23 LAB — POCT INR: INR: 2.1 (ref 2.0–3.0)

## 2022-05-23 NOTE — Progress Notes (Signed)
Remote ICD transmission.   

## 2022-05-30 ENCOUNTER — Ambulatory Visit (HOSPITAL_COMMUNITY): Payer: Self-pay | Admitting: Pharmacist

## 2022-05-30 LAB — POCT INR: INR: 1.5 — AB (ref 2.0–3.0)

## 2022-06-06 ENCOUNTER — Ambulatory Visit (HOSPITAL_COMMUNITY): Payer: Self-pay | Admitting: Pharmacist

## 2022-06-06 LAB — POCT INR: INR: 1.6 — AB (ref 2.0–3.0)

## 2022-06-13 ENCOUNTER — Ambulatory Visit (HOSPITAL_COMMUNITY): Payer: Self-pay | Admitting: Pharmacist

## 2022-06-13 LAB — POCT INR: INR: 1.4 — AB (ref 2.0–3.0)

## 2022-06-20 ENCOUNTER — Ambulatory Visit (HOSPITAL_COMMUNITY): Payer: Self-pay | Admitting: Pharmacist

## 2022-06-20 LAB — POCT INR: INR: 1.7 — AB (ref 2.0–3.0)

## 2022-06-27 ENCOUNTER — Ambulatory Visit (HOSPITAL_COMMUNITY): Payer: Self-pay | Admitting: Pharmacist

## 2022-06-27 LAB — POCT INR: INR: 1.6 — AB (ref 2.0–3.0)

## 2022-07-01 NOTE — Progress Notes (Signed)
Patient's wife called clinic reporting that patient has swelling on the top of his left foot. She notes a small amount of swelling in his right foot. Denies lightheadedness, dizziness, shortness of breath or trauma to the area. Spoke with Dr. Haroldine Laws in regards to complain. MD advises to watch the area closely and apply TED hose. Message relayed to patient and wife. They currently do not have TED hose but are aware they can be purchased at a medical supply store in the mean time. Patient verbalizes understanding of instructions and will call if symptoms worsen.  Bobbye Morton RN, BSN VAD Coordinator 24/7 Pager [phone]

## 2022-07-04 ENCOUNTER — Telehealth (HOSPITAL_COMMUNITY): Payer: Self-pay | Admitting: Licensed Clinical Social Worker

## 2022-07-04 ENCOUNTER — Ambulatory Visit (HOSPITAL_COMMUNITY): Payer: Self-pay | Admitting: Pharmacist

## 2022-07-04 ENCOUNTER — Encounter (HOSPITAL_COMMUNITY): Payer: Self-pay | Admitting: *Deleted

## 2022-07-04 LAB — POCT INR: INR: 1.8 — AB (ref 2.0–3.0)

## 2022-07-04 NOTE — Telephone Encounter (Signed)
H&V Care Navigation CSW Progress Note  Clinical Social Worker  received call from patient's wife [name]  to assist with utilities.  Patient is participating in a Managed Medicaid Plan:  No  Patient's wife called to share they are struggling financially with the utility bill. Wife states they have a very tight budget and have fallen behind with the power bill and asking for some assistance. CSW discussed some options for future and will assist immediately with support from the Patient Care Fund. Wife very tearful and states she is very stressed with caregiving and all the financial needs in the household.   CSW provided supportive intervention and will continue to follow for support and financial resources. Raquel Sarna, [place], [place]   SDOH Screenings   Food Insecurity: Food Insecurity Present (12/13/2021)  Housing: Low Risk  (08/30/2021)  Transportation Needs: No Transportation Needs (08/30/2021)  Utilities: At Risk (07/04/2022)  Depression (PHQ2-9): High Risk (07/19/2021)  Financial Resource Strain: High Risk (07/04/2022)  Physical Activity: Inactive (03/10/2017)  Stress: No Stress Concern Present (03/10/2017)  Tobacco Use: Medium Risk (05/10/2022)

## 2022-07-10 ENCOUNTER — Other Ambulatory Visit (HOSPITAL_COMMUNITY): Payer: Self-pay | Admitting: Internal Medicine

## 2022-07-10 DIAGNOSIS — Z95811 Presence of heart assist device: Secondary | ICD-10-CM

## 2022-07-11 ENCOUNTER — Telehealth (HOSPITAL_COMMUNITY): Payer: Self-pay | Admitting: Licensed Clinical Social Worker

## 2022-07-11 ENCOUNTER — Ambulatory Visit (HOSPITAL_COMMUNITY): Payer: Self-pay | Admitting: Pharmacist

## 2022-07-11 LAB — POCT INR: INR: 1.4 — AB (ref 2.0–3.0)

## 2022-07-11 NOTE — Telephone Encounter (Signed)
CSW received call form patient's wife inquiring about Patient David [name] assistance for [employer]. CSW informed by [employer] that check will be ready tomorrow. CSW informed wife and will follow up with David [name] Pay tomorrow once check is obtained. David [name], [place], [place]

## 2022-07-12 ENCOUNTER — Telehealth (HOSPITAL_COMMUNITY): Payer: Self-pay | Admitting: Licensed Clinical Social Worker

## 2022-07-12 NOTE — Telephone Encounter (Signed)
H&V Care Navigation CSW Progress Note  Clinical Social Worker contacted caregiver by phone to discuss utility bill.  Patient is participating in a Managed Medicaid Plan:  No  CSW contacted patient's wife to inform that [employer] bill was paid in order to avoid disconnection. Wife grateful for the assistance and support. CSW will follow up on next clinic visit. Raquel Sarna, [place], [place]   SDOH Screenings   Food Insecurity: Food Insecurity Present (12/13/2021)  Housing: Low Risk  (08/30/2021)  Transportation Needs: No Transportation Needs (08/30/2021)  Utilities: At Risk (07/12/2022)  Depression (PHQ2-9): High Risk (07/19/2021)  Financial Resource Strain: High Risk (07/04/2022)  Physical Activity: Inactive (03/10/2017)  Stress: No Stress Concern Present (03/10/2017)  Tobacco Use: Medium Risk (05/10/2022)

## 2022-07-15 ENCOUNTER — Other Ambulatory Visit (HOSPITAL_COMMUNITY): Payer: Self-pay | Admitting: *Deleted

## 2022-07-15 DIAGNOSIS — Z7901 Long term (current) use of anticoagulants: Secondary | ICD-10-CM

## 2022-07-15 DIAGNOSIS — Z95811 Presence of heart assist device: Secondary | ICD-10-CM

## 2022-07-17 NOTE — Progress Notes (Signed)
LVAD Clinic Follow Up Note HF MD: Dr Haroldine Laws  HPI: David [name] is a 57 y.o. male with history of ICD (09 PCI to LAD, mRCA, '10 PCI to Lcx and '12 mRCA), HFrEF (EF ~20% for > 5 years), HLD and HTN. S/P LVAD HM-III 04/20/18.    Admitted 04/19/17 - 05/27/2017 for scheduled LVAD implant. On 12/27 he underwent HMIII LVAD implant. Hospital course c/b PNA shock liver and AKI. Required short term HD.  Readmitted 05/30/17 hgb 3.8. INR 2.9.transfused 4u pRBCs. 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 Underwent ventriculostomy. Admission c/b by periods of confusion and urinary retention. Discharged to [place] for routine f/u with his wife. Feels great. Denies CP, SOB, edema. BP well controlled. No palpitations. Denies orthopnea or PND. No fevers, chills or problems with driveline. No bleeding, melena or neuro symptoms. No VAD alarms. Taking all meds as prescribed.   ICD device interrogation: AF burden 12.7%. Longest episode 18 hours    VAD Indication: Destination therapy- CKD   VAD interrogation & Equipment Management: Speed: 5400 Flow: 4.6 Power: 4.1w    PI: 4.4   Alarms: none Events: 5-10 PI events daily  Fixed speed 5400 Low speed limit: 5100   Primary Controller:  Replace back up battery in 20 months Back up controller:  Replace back up battery in 19 months   Annual Equipment Maintenance on UBC/PM was performed on 05/10/2022.   I reviewed the LVAD parameters from today and compared the results to the patient's prior recorded data. LVAD interrogation was NEGATIVE for significant power changes, negative for clinical alarms and STABLE for PI events/speed drops. No programming changes were made and pump is functioning within specified parameters. Pt is performing daily controller and system monitor self tests along with completing weekly and  monthly maintenance for LVAD equipment.   LVAD equipment check completed and is in good working order. Back up equipment not present.     Exit Site Care: Drive line is being maintained weekly by [name], his wife. Exit site healed and incorporated, the velour is fully implanted at exit site. No redness, drainage or foul odor noted.  Past Medical History:  Diagnosis Date   AICD (automatic cardioverter/defibrillator) present    Anemia    Anxiety    CAD (coronary artery disease) 2009   AMI in 12/2007 with PCI to LAD, staged PCI to  mid/distal RCA, NSTEMI in 02/2009 with BMS to LCx   CHF (congestive heart failure) ([place])    Chronic kidney disease 11/03/2016   stage 3 kidney disease   Dysrhythmia    "arrythmia problems at some point", "fatal rhythms"   History of blood transfusion    "I was bleeding from was rectum" (04/19/2017)   HLD (hyperlipidemia)    HTN (hypertension)    Hyperlipidemia 03/15/2011   Ischemic cardiomyopathy    Admitted in 07/2010 with CHF exacerbation   Myocardial infarction ([place])    "I've had 4" (04/19/2017)   Pneumonia 2018 X 1   Seizures ([place])    one seizure in 04/2016 during cardiac event (04/19/2017)   STEMI 2009 (anterior), 2010 (lateral), and 2012 (inferior) 03/14/2011    Current Outpatient Medications  Medication Sig Dispense Refill   acetaminophen (TYLENOL) 325 MG tablet Take 2 tablets (650 mg total) by mouth every 4 (four) hours as needed for headache or mild  pain.     amiodarone (PACERONE) 200 MG tablet Take 1 tablet (200 mg total) by mouth daily. 90 tablet 3   amLODipine (NORVASC) 5 MG tablet Take 1 tablet (5 mg total) by mouth daily. 90 tablet 3   atorvastatin (LIPITOR) 40 MG tablet Take 1 tablet (40 mg total) by mouth daily. 90 tablet 6   fluticasone (FLONASE) 50 MCG/ACT nasal spray Place 2 sprays into both nostrils daily. 9.9 mL 3   losartan (COZAAR) 50 MG tablet Take 2 tablets (100 mg total) by mouth daily. 180 tablet 3   magnesium oxide (MAG-OX)  400 (240 Mg) MG tablet Take 1 tablet (400 mg total) by mouth 2 (two) times daily. 60 tablet 0   Mouthwashes (MOUTH RINSE) LIQD solution 15 mLs by Mouth Rinse route 2 (two) times daily.  0   Multiple Vitamin (MULTIVITAMIN WITH MINERALS) TABS tablet Take 1 tablet by mouth daily. 30 tablet 6   pantoprazole (PROTONIX) 40 MG tablet Take 1 tablet (40 mg total) by mouth daily. 90 tablet 6   polyethylene glycol (MIRALAX / GLYCOLAX) 17 g packet Take 17 g by mouth daily. 30 each 0   polyvinyl alcohol (LIQUIFILM TEARS) 1.4 % ophthalmic solution Place 2 drops into both eyes as needed for dry eyes. 15 mL 0   tamsulosin (FLOMAX) 0.4 MG CAPS capsule Take 1 capsule by mouth once daily 90 capsule 3   traZODone (DESYREL) 50 MG tablet Take 1 tablet (50 mg total) by mouth at bedtime. 90 tablet 3   warfarin (COUMADIN) 2 MG tablet Take 2 mg (1 tablet) every Sun, MWF and 1 mg (0.5 tab) all other days or as directed by HF Clinic. 30 tablet 11   Blood Pressure Monitoring (BLOOD PRESSURE CUFF) MISC 1 Device by Does not apply route daily. (Patient not taking: Reported on 05/10/2022) 1 each 0   senna (SENOKOT) 8.6 MG TABS tablet Take 2 tablets (17.2 mg total) by mouth at bedtime. (Patient not taking: Reported on 07/18/2022) 120 tablet 0   topiramate (TOPAMAX) 50 MG tablet Take 1 tablet by mouth twice daily (Patient not taking: Reported on 05/10/2022) 60 tablet 0   No current facility-administered medications for this encounter.    Plavix [clopidogrel bisulfate]  Review of systems complete and found to be negative unless listed in HPI.      Vitals:   07/18/22 1127 07/18/22 1130  BP: (!) 90/0 99/75  Pulse: 71       Wt Readings from Last 3 Encounters:  07/18/22 79.1 kg (174 lb 6.4 oz)  05/10/22 73.8 kg (162 lb 9.6 oz)  02/17/22 73.4 kg (161 lb 12.8 oz)     Vital Signs:  Doppler Pressure: 90 Automatc BP: 99/75 (82) HR: 71 NSR SPO2: UTO    Weight: 174.4 lb w/ eqt Last weight: 162.6 lb w/ eqt    Physical  Exam: General:  NAD.  HEENT: normal  Neck: supple. JVP not elevated.  Carotids 2+ bilat; no bruits. No lymphadenopathy or thryomegaly appreciated. Cor: LVAD hum.  Lungs: Clear. Abdomen: soft, nontender, non-distended. No hepatosplenomegaly. No bruits or masses. Good bowel sounds. Driveline site clean. Anchor in place.  Extremities: no cyanosis, clubbing, rash. Warm no edema  Neuro: alert & oriented x 3. No focal deficits. Moves all 4 without problem    ASSESSMENT AND PLAN:  1. Chronic systolic CHF with HM III LVAD implant 04/20/17:  - Doing very well - NYHA I - Volume status ok - He has been seen at [facility]  for transplant referral but has decided not to pursue VAD.    2.. PAF/AFL - had DC-CV on 10/17/18 with subsequent short episodes of AF on amio 100 daily - Had recurrent AF in 10/22. Amio increased to 200 bid and converted.  - Remains in NSR but 12.7% AF on ICD today,.  - I had planned to cut amio down to 100 daily today but will keep at 200 daily. Will arrange f/u with Dr. Caryl Comes - Recent amio labs ok .  3. SAH - admit in 2/23 with [facility] requiring ventriculostomy - much improved. HAs resolved.  - BP well controlled - No change. No residual on exam    4. h/o UGI AVM bleed - s/p APC x 4 lesions in 2/19 - No evidence of recurrent bleeding - Continue PPI - Off ASA due to GIB.  - Hgb 14.3  5. CKD Stage IIIa:  - Creatinine baseline 1.3 - 1.7  - Creatinine 1.49 today  - Continue to follow - Consider SGLT2i at next visit  6. VAD management - VAD interrogated personally. Parameters stable.. - LDH 195 - INR goal 1.5-2.0 with h/o SAH Off ASA   - INR  1.5 Discussed dosing with PharmD personally. - MAPs good - DL looks ok  7. CAD:  s/p PCI RCA/PLOM with DES x2 and DES to mild LAD 1/18 - No s/s angina - Continue statin. Off ASA due to GIB  8. Hypomagnesemia. -  Mag 1.8 Continue mag supplements  9. HTN - needs tight BP control with h/o SAH.  - Much improved with addition  of amlodipine - MAPs lookd good  10. [employer] fan - unfortunately this has been incurable even with VAD implant - [employer] & [employer] both in [place]. Sarah working on getting him a Immunologist and a t-shirt to show his [employer] spirit!  Total time spent 35 minutes. Over half that time spent discussing above.    Glori Bickers, MD  10:13 PM

## 2022-07-18 ENCOUNTER — Ambulatory Visit (HOSPITAL_COMMUNITY): Payer: Self-pay | Admitting: Pharmacist

## 2022-07-18 ENCOUNTER — Ambulatory Visit (HOSPITAL_COMMUNITY)
Admission: RE | Admit: 2022-07-18 | Discharge: 2022-07-18 | Disposition: A | Discharge status: Home / Self Care | Payer: Medicare HMO | Source: Ambulatory Visit | Attending: Internal Medicine | Admitting: Internal Medicine

## 2022-07-18 ENCOUNTER — Encounter (HOSPITAL_COMMUNITY): Payer: Self-pay | Admitting: Internal Medicine

## 2022-07-18 VITALS — BP 99/75 | HR 71 | Wt 174.4 lb

## 2022-07-18 DIAGNOSIS — I609 Nontraumatic subarachnoid hemorrhage, unspecified: Secondary | ICD-10-CM

## 2022-07-18 DIAGNOSIS — Z95810 Presence of automatic (implantable) cardiac defibrillator: Secondary | ICD-10-CM | POA: Diagnosis not present

## 2022-07-18 DIAGNOSIS — Z8679 Personal history of other diseases of the circulatory system: Secondary | ICD-10-CM | POA: Insufficient documentation

## 2022-07-18 DIAGNOSIS — Z7901 Long term (current) use of anticoagulants: Secondary | ICD-10-CM | POA: Diagnosis not present

## 2022-07-18 DIAGNOSIS — I5022 Chronic systolic (congestive) heart failure: Secondary | ICD-10-CM | POA: Diagnosis not present

## 2022-07-18 DIAGNOSIS — E785 Hyperlipidemia, unspecified: Secondary | ICD-10-CM | POA: Diagnosis not present

## 2022-07-18 DIAGNOSIS — N1831 Chronic kidney disease, stage 3a: Secondary | ICD-10-CM | POA: Insufficient documentation

## 2022-07-18 DIAGNOSIS — Z79899 Other long term (current) drug therapy: Secondary | ICD-10-CM | POA: Diagnosis not present

## 2022-07-18 DIAGNOSIS — I480 Paroxysmal atrial fibrillation: Secondary | ICD-10-CM | POA: Insufficient documentation

## 2022-07-18 DIAGNOSIS — Z955 Presence of coronary angioplasty implant and graft: Secondary | ICD-10-CM | POA: Diagnosis not present

## 2022-07-18 DIAGNOSIS — I252 Old myocardial infarction: Secondary | ICD-10-CM | POA: Diagnosis not present

## 2022-07-18 DIAGNOSIS — I2510 Atherosclerotic heart disease of native coronary artery without angina pectoris: Secondary | ICD-10-CM | POA: Diagnosis not present

## 2022-07-18 DIAGNOSIS — I10 Essential (primary) hypertension: Secondary | ICD-10-CM

## 2022-07-18 DIAGNOSIS — Z95811 Presence of heart assist device: Secondary | ICD-10-CM | POA: Diagnosis not present

## 2022-07-18 DIAGNOSIS — I454 Nonspecific intraventricular block: Secondary | ICD-10-CM | POA: Diagnosis not present

## 2022-07-18 DIAGNOSIS — I130 Hypertensive heart and chronic kidney disease with heart failure and stage 1 through stage 4 chronic kidney disease, or unspecified chronic kidney disease: Secondary | ICD-10-CM | POA: Insufficient documentation

## 2022-07-18 LAB — CBC
HCT: 45.2 % (ref 39.0–52.0)
Hemoglobin: 14.3 g/dL (ref 13.0–17.0)
MCH: 27 pg (ref 26.0–34.0)
MCHC: 31.6 g/dL (ref 30.0–36.0)
MCV: 85.3 fL (ref 80.0–100.0)
Platelets: 203 10*3/uL (ref 150–400)
RBC: 5.3 MIL/uL (ref 4.22–5.81)
RDW: 12.8 % (ref 11.5–15.5)
WBC: 7.5 10*3/uL (ref 4.0–10.5)
nRBC: 0 % (ref 0.0–0.2)

## 2022-07-18 LAB — BASIC METABOLIC PANEL
Anion gap: 10 (ref 5–15)
BUN: 11 mg/dL (ref 6–20)
CO2: 26 mmol/L (ref 22–32)
Calcium: 9 mg/dL (ref 8.9–10.3)
Chloride: 102 mmol/L (ref 98–111)
Creatinine, Ser: 1.49 mg/dL — ABNORMAL HIGH (ref 0.61–1.24)
GFR, Estimated: 54 mL/min — ABNORMAL LOW (ref >60)
Glucose, Bld: 80 mg/dL (ref 70–99)
Potassium: 4.2 mmol/L (ref 3.5–5.1)
Sodium: 138 mmol/L (ref 135–145)

## 2022-07-18 LAB — PROTIME-INR
INR: 1.5 — ABNORMAL HIGH (ref 0.8–1.2)
Prothrombin Time: 18 seconds — ABNORMAL HIGH (ref 11.4–15.2)

## 2022-07-18 LAB — LACTATE DEHYDROGENASE: LDH: 195 U/L — ABNORMAL HIGH (ref 98–192)

## 2022-07-18 LAB — MAGNESIUM: Magnesium: 1.8 mg/dL (ref 1.7–2.4)

## 2022-07-18 NOTE — Patient Instructions (Signed)
No medication changes today Coumadin dosing per Lauren PharmD Return to [facility] clinic in 2 months We will schedule you an appt to see Dr Caryl Comes in the device clinic.  [place] at [facility] [place], [place] [address] [phone]

## 2022-07-18 NOTE — Progress Notes (Signed)
H&V Care Navigation CSW Progress Note  Clinical Social Worker met with patient to follow up on financial needs and increased caregiver stress.  Patient is participating in a Managed Medicaid Plan:  NO  CSW followed up with patient and wife regarding financial assistance with [employer]. Wife states she is very grateful for the support and feels her stress has been reduced at this time. Patient in great spirits and states he is feeling good. CSW provided supportive intervention and encouraged wife to contact CSW if needed for further caregiver stress. Raquel Sarna, [place], [place]   SDOH Screenings   Food Insecurity: Food Insecurity Present (12/13/2021)  Housing: Low Risk  (08/30/2021)  Transportation Needs: No Transportation Needs (08/30/2021)  Utilities: At Risk (07/12/2022)  Depression (PHQ2-9): High Risk (07/19/2021)  Financial Resource Strain: High Risk (07/04/2022)  Physical Activity: Inactive (03/10/2017)  Stress: No Stress Concern Present (03/10/2017)  Tobacco Use: Medium Risk (07/18/2022)

## 2022-07-18 NOTE — Progress Notes (Addendum)
Patient presents for 2 mo follow up in [facility] today with [name]) wife. No complaints from patient.   Denies lightheadedness, dizziness, falls, shortness of breath, heart failure symptoms, headaches, and signs of bleeding. He is enjoying watching the basketball tournament.   Per [name], they are having difficulties with pt's home remote transmission machine. Provided her with Device Clinic contact information. Per last device remote transmission 93 rate controlled AF episodes noted, longest lasted 18 hrs. Hx PAF- burden 12.7%. EKG obtained today (SR 1st degree HB rate 71)- reviewed with Dr Haroldine Laws. Will continue Amiodarone 200 mg daily. Per Dr Haroldine Laws send to see Dr Caryl Comes. Appt scheduled 08/05/22 at 11:00. Left voicemail for pt's wife following clinic appt with appt information.   Pt's weight is up. Reports his appetite has increased, and he is snacking a lot. Fluid status stable today.   Vital Signs:  Doppler Pressure: 90 Automatc BP: 99/75 (82) HR: 71 NSR SPO2: UTO    Weight: 174.4 lb w/ eqt Last weight: 162.6 lb w/ eqt  VAD Indication: Destination therapy- CKD   VAD interrogation & Equipment Management: Speed: 5400 Flow: 4.6 Power: 4.1w    PI: 4.4   Alarms: none Events: 5-10 PI events daily  Fixed speed 5400 Low speed limit: 5100   Primary Controller:  Replace back up battery in 20 months Back up controller:  Replace back up battery in 19 months   Annual Equipment Maintenance on UBC/PM was performed on 05/10/2022.   I reviewed the LVAD parameters from today and compared the results to the patient's prior recorded data. LVAD interrogation was NEGATIVE for significant power changes, negative for clinical alarms and STABLE for PI events/speed drops. No programming changes were made and pump is functioning within specified parameters. Pt is performing daily controller and system monitor self tests along with completing weekly and monthly maintenance for LVAD equipment.    LVAD equipment check completed and is in good working order. Back up equipment present at this appointment.  Exit Site Care: Drive line is being maintained weekly by [name], his wife. Exit site healed and incorporated, the velour is fully implanted at exit site. No redness, drainage, rash, or foul odor noted. Pt denies fever or chills. Pt given 8 weekly dressings and adhesive remover for home use.  Device: Med[employer]onic Bi-V Therapies: VF 200 VT 167 Pacing:  VVI 50 Last check: 04/27/22  BP & Labs:  Doppler BP 90 - Doppler is reflecting Modified systolic   Hgb 123456 - No S/S of bleeding. Specifically denies melena/BRBPR or nosebleeds.  LDH stable at 195 with established baseline of 165- 250. Denies tea-colored urine. No power elevations noted on interrogation.    Patient Instructions:  No medication changes today Coumadin dosing per Lauren PharmD Return to [facility] clinic in 2 months We will schedule you an appt to see Dr Caryl Comes in the device clinic.  [place] at [facility] [place], [place] [address] [phone]  Emerson Monte RN [place] Coordinator  Office: [phone]  24/7 Pager: [phone]

## 2022-07-25 ENCOUNTER — Ambulatory Visit (HOSPITAL_COMMUNITY): Payer: Self-pay | Admitting: Pharmacist

## 2022-07-25 LAB — POCT INR: INR: 1.7 — AB (ref 2.0–3.0)

## 2022-07-27 ENCOUNTER — Ambulatory Visit (INDEPENDENT_AMBULATORY_CARE_PROVIDER_SITE_OTHER): Payer: Medicare HMO

## 2022-07-27 DIAGNOSIS — I255 Ischemic cardiomyopathy: Secondary | ICD-10-CM

## 2022-07-28 LAB — CUP PACEART REMOTE DEVICE CHECK
Battery Remaining Longevity: 32 mo
Battery Voltage: 2.94 V
Brady Statistic AP VP Percent: 0 %
Brady Statistic AP VS Percent: 0 %
Brady Statistic AS VP Percent: 0.22 %
Brady Statistic AS VS Percent: 99.78 %
Brady Statistic RA Percent Paced: 0 %
Brady Statistic RV Percent Paced: 0.26 %
Date Time Interrogation Session: 20240404003627
HighPow Impedance: 67 Ohm
Implantable Lead Connection Status: 753985
Implantable Lead Connection Status: 753985
Implantable Lead Connection Status: 753985
Implantable Lead Connection Status: 753985
Implantable Lead Implant Date: 20180409
Implantable Lead Implant Date: 20180409
Implantable Lead Implant Date: 20180716
Implantable Lead Implant Date: 20180716
Implantable Lead Location: 753858
Implantable Lead Location: 753858
Implantable Lead Location: 753859
Implantable Lead Location: 753860
Implantable Lead Model: 5076
Implantable Lead Model: 511212
Implantable Lead Model: 511212
Implantable Lead Serial Number: 263773
Implantable Lead Serial Number: 263775
Implantable Pulse Generator Implant Date: 20180409
Lead Channel Impedance Value: 266 Ohm
Lead Channel Impedance Value: 3401 Ohm
Lead Channel Impedance Value: 342 Ohm
Lead Channel Impedance Value: 380 Ohm
Lead Channel Impedance Value: 4047 Ohm
Lead Channel Impedance Value: 760 Ohm
Lead Channel Pacing Threshold Amplitude: 0.625 V
Lead Channel Pacing Threshold Amplitude: 1 V
Lead Channel Pacing Threshold Amplitude: 1.75 V
Lead Channel Pacing Threshold Pulse Width: 0.4 ms
Lead Channel Pacing Threshold Pulse Width: 0.4 ms
Lead Channel Pacing Threshold Pulse Width: 1 ms
Lead Channel Sensing Intrinsic Amplitude: 11.125 mV
Lead Channel Sensing Intrinsic Amplitude: 11.125 mV
Lead Channel Sensing Intrinsic Amplitude: 2.875 mV
Lead Channel Sensing Intrinsic Amplitude: 2.875 mV
Lead Channel Setting Pacing Amplitude: 2.5 V
Lead Channel Setting Pacing Pulse Width: 0.4 ms
Lead Channel Setting Sensing Sensitivity: 0.6 mV
Zone Setting Status: 755011

## 2022-08-01 ENCOUNTER — Ambulatory Visit (HOSPITAL_COMMUNITY): Payer: Self-pay

## 2022-08-01 LAB — POCT INR: INR: 1.9 — AB (ref 2.0–3.0)

## 2022-08-05 ENCOUNTER — Ambulatory Visit: Payer: Medicare HMO | Admitting: Internal Medicine

## 2022-08-08 ENCOUNTER — Telehealth (HOSPITAL_COMMUNITY): Payer: Self-pay | Admitting: Licensed Clinical Social Worker

## 2022-08-08 NOTE — Telephone Encounter (Signed)
CSW received call from patient's wife stating that the Patient Care Fund check payment to [employer] did not go through and they are at risk for disconnection. CSW contacted [employer] and confirmed that due to corporate check it was returned. CSW assisted with payment via credit card from patient Care Fund. Wife appreciative and denies any other concerns. Lasandra Beech, LCSW, CCSW-MCS [phone]

## 2022-08-09 ENCOUNTER — Ambulatory Visit (HOSPITAL_COMMUNITY): Payer: Self-pay

## 2022-08-09 LAB — POCT INR: INR: 1.8 — AB (ref 2.0–3.0)

## 2022-08-15 ENCOUNTER — Ambulatory Visit (HOSPITAL_COMMUNITY): Payer: Self-pay | Admitting: Pharmacist

## 2022-08-15 LAB — POCT INR: INR: 2.1 (ref 2.0–3.0)

## 2022-08-22 ENCOUNTER — Ambulatory Visit (HOSPITAL_COMMUNITY): Payer: Self-pay | Admitting: Pharmacist

## 2022-08-22 LAB — POCT INR: INR: 1.9 — AB (ref 2.0–3.0)

## 2022-08-29 ENCOUNTER — Ambulatory Visit (HOSPITAL_COMMUNITY): Payer: Self-pay | Admitting: Pharmacist

## 2022-08-29 LAB — POCT INR: INR: 2.1 (ref 2.0–3.0)

## 2022-08-30 DIAGNOSIS — Z95810 Presence of automatic (implantable) cardiac defibrillator: Secondary | ICD-10-CM | POA: Insufficient documentation

## 2022-09-02 ENCOUNTER — Ambulatory Visit: Payer: Medicare HMO | Admitting: Internal Medicine

## 2022-09-02 DIAGNOSIS — I480 Paroxysmal atrial fibrillation: Secondary | ICD-10-CM

## 2022-09-02 DIAGNOSIS — I5022 Chronic systolic (congestive) heart failure: Secondary | ICD-10-CM

## 2022-09-02 DIAGNOSIS — Z95810 Presence of automatic (implantable) cardiac defibrillator: Secondary | ICD-10-CM

## 2022-09-02 DIAGNOSIS — I255 Ischemic cardiomyopathy: Secondary | ICD-10-CM

## 2022-09-02 NOTE — Progress Notes (Signed)
Remote ICD transmission.   

## 2022-09-05 ENCOUNTER — Ambulatory Visit (HOSPITAL_COMMUNITY): Payer: Self-pay | Admitting: Pharmacist

## 2022-09-05 LAB — POCT INR: INR: 2.1 (ref 2.0–3.0)

## 2022-09-12 ENCOUNTER — Ambulatory Visit (HOSPITAL_COMMUNITY): Payer: Self-pay | Admitting: Pharmacist

## 2022-09-12 LAB — POCT INR: INR: 2.1 (ref 2.0–3.0)

## 2022-09-20 ENCOUNTER — Ambulatory Visit (HOSPITAL_COMMUNITY): Payer: Self-pay | Admitting: Pharmacist

## 2022-09-20 LAB — POCT INR: INR: 2.2 (ref 2.0–3.0)

## 2022-09-21 ENCOUNTER — Other Ambulatory Visit (HOSPITAL_COMMUNITY): Payer: Self-pay

## 2022-09-21 DIAGNOSIS — Z95811 Presence of heart assist device: Secondary | ICD-10-CM

## 2022-09-21 DIAGNOSIS — Z7901 Long term (current) use of anticoagulants: Secondary | ICD-10-CM

## 2022-09-23 ENCOUNTER — Encounter (HOSPITAL_COMMUNITY): Payer: Medicare HMO | Admitting: Internal Medicine

## 2022-09-26 ENCOUNTER — Ambulatory Visit (HOSPITAL_COMMUNITY): Payer: Self-pay | Admitting: Pharmacist

## 2022-09-26 LAB — POCT INR: INR: 1.8 — AB (ref 2.0–3.0)

## 2022-10-03 ENCOUNTER — Ambulatory Visit (HOSPITAL_COMMUNITY): Payer: Self-pay | Admitting: Pharmacist

## 2022-10-03 LAB — POCT INR: INR: 2 (ref 2.0–3.0)

## 2022-10-10 ENCOUNTER — Ambulatory Visit (HOSPITAL_COMMUNITY): Payer: Self-pay | Admitting: Pharmacist

## 2022-10-10 LAB — POCT INR: INR: 1.9 — AB (ref 2.0–3.0)

## 2022-10-14 ENCOUNTER — Encounter (HOSPITAL_COMMUNITY): Payer: Medicare HMO | Admitting: Internal Medicine

## 2022-10-17 ENCOUNTER — Ambulatory Visit (HOSPITAL_COMMUNITY): Payer: Self-pay | Admitting: Pharmacist

## 2022-10-17 LAB — POCT INR: INR: 2 (ref 2.0–3.0)

## 2022-10-19 ENCOUNTER — Ambulatory Visit (HOSPITAL_COMMUNITY)
Admission: RE | Admit: 2022-10-19 | Discharge: 2022-10-19 | Disposition: A | Discharge status: Home / Self Care | Payer: Medicare HMO | Source: Ambulatory Visit | Attending: Internal Medicine | Admitting: Internal Medicine

## 2022-10-19 ENCOUNTER — Encounter (HOSPITAL_COMMUNITY): Payer: Self-pay | Admitting: Internal Medicine

## 2022-10-19 VITALS — BP 92/64 | HR 69 | Wt 178.8 lb

## 2022-10-19 DIAGNOSIS — I480 Paroxysmal atrial fibrillation: Secondary | ICD-10-CM

## 2022-10-19 DIAGNOSIS — Z955 Presence of coronary angioplasty implant and graft: Secondary | ICD-10-CM | POA: Insufficient documentation

## 2022-10-19 DIAGNOSIS — Z7901 Long term (current) use of anticoagulants: Secondary | ICD-10-CM

## 2022-10-19 DIAGNOSIS — Z95810 Presence of automatic (implantable) cardiac defibrillator: Secondary | ICD-10-CM | POA: Insufficient documentation

## 2022-10-19 DIAGNOSIS — Z79899 Other long term (current) drug therapy: Secondary | ICD-10-CM | POA: Diagnosis not present

## 2022-10-19 DIAGNOSIS — I2510 Atherosclerotic heart disease of native coronary artery without angina pectoris: Secondary | ICD-10-CM | POA: Diagnosis not present

## 2022-10-19 DIAGNOSIS — N1831 Chronic kidney disease, stage 3a: Secondary | ICD-10-CM | POA: Diagnosis not present

## 2022-10-19 DIAGNOSIS — Z8679 Personal history of other diseases of the circulatory system: Secondary | ICD-10-CM | POA: Diagnosis not present

## 2022-10-19 DIAGNOSIS — I10 Essential (primary) hypertension: Secondary | ICD-10-CM

## 2022-10-19 DIAGNOSIS — E785 Hyperlipidemia, unspecified: Secondary | ICD-10-CM | POA: Diagnosis not present

## 2022-10-19 DIAGNOSIS — I5022 Chronic systolic (congestive) heart failure: Secondary | ICD-10-CM

## 2022-10-19 DIAGNOSIS — Z95811 Presence of heart assist device: Secondary | ICD-10-CM

## 2022-10-19 DIAGNOSIS — I609 Nontraumatic subarachnoid hemorrhage, unspecified: Secondary | ICD-10-CM

## 2022-10-19 DIAGNOSIS — I130 Hypertensive heart and chronic kidney disease with heart failure and stage 1 through stage 4 chronic kidney disease, or unspecified chronic kidney disease: Secondary | ICD-10-CM | POA: Insufficient documentation

## 2022-10-19 LAB — BASIC METABOLIC PANEL
Anion gap: 6 (ref 5–15)
BUN: 18 mg/dL (ref 6–20)
CO2: 25 mmol/L (ref 22–32)
Calcium: 8.6 mg/dL — ABNORMAL LOW (ref 8.9–10.3)
Chloride: 108 mmol/L (ref 98–111)
Creatinine, Ser: 1.8 mg/dL — ABNORMAL HIGH (ref 0.61–1.24)
GFR, Estimated: 43 mL/min — ABNORMAL LOW (ref >60)
Glucose, Bld: 80 mg/dL (ref 70–99)
Potassium: 3.9 mmol/L (ref 3.5–5.1)
Sodium: 139 mmol/L (ref 135–145)

## 2022-10-19 LAB — CBC
HCT: 43.1 % (ref 39.0–52.0)
Hemoglobin: 13.6 g/dL (ref 13.0–17.0)
MCH: 26.1 pg (ref 26.0–34.0)
MCHC: 31.6 g/dL (ref 30.0–36.0)
MCV: 82.7 fL (ref 80.0–100.0)
Platelets: 208 10*3/uL (ref 150–400)
RBC: 5.21 MIL/uL (ref 4.22–5.81)
RDW: 14 % (ref 11.5–15.5)
WBC: 8.5 10*3/uL (ref 4.0–10.5)
nRBC: 0 % (ref 0.0–0.2)

## 2022-10-19 LAB — PROTIME-INR
INR: 2.1 — ABNORMAL HIGH (ref 0.8–1.2)
Prothrombin Time: 23.8 seconds — ABNORMAL HIGH (ref 11.4–15.2)

## 2022-10-19 LAB — LACTATE DEHYDROGENASE: LDH: 194 U/L — ABNORMAL HIGH (ref 98–192)

## 2022-10-19 NOTE — Patient Instructions (Signed)
No medication changes ?Coumadin dosing per Lauren PharmD ?Return to VAD clinic in 2 months  ?

## 2022-10-19 NOTE — Progress Notes (Signed)
LVAD Clinic Follow Up Note HF MD: Dr [name]  HPI: David [name] is a 57 y.o. male with history of ICD (09 PCI to LAD, mRCA, '10 PCI to Lcx and '12 mRCA), HFrEF (EF ~20% for > 5 years), HLD and HTN. S/P LVAD HM-III 04/20/18.    Admitted 04/19/17 - 05/27/2017 for scheduled LVAD implant. On 12/27 he underwent HMIII LVAD implant. Hospital course c/b PNA shock liver and AKI. Required short term HD.  Readmitted 05/30/17 hgb 3.8. INR 2.9.transfused 4u pRBCs. 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 Underwent ventriculostomy. Admission c/b by periods of confusion and urinary retention. Discharged to [facility]  Returns for routine f/u with his wife. Feels great. Able to do all activities without limitation. Has occasional ankle edema and will take lasix as needed. Denies orthopnea or PND. No fevers, chills or problems with driveline. No bleeding, melena or neuro symptoms. No VAD alarms. Taking all meds as prescribed.    VAD Indication: Destination therapy- CKD   VAD interrogation & Equipment Management: Speed: 5400 Flow: 4.9 Power: 4.1w    PI: 3.1   Alarms: few low voltage advisories  Events: 10 - 20 PI events daily  Fixed speed 5400 Low speed limit: 5100   Primary Controller:  Replace back up battery in 17 months Back up controller:  Replace back up battery in 13 months   Annual Equipment Maintenance on UBC/PM was performed on 05/10/2022.   I reviewed the LVAD parameters from today and compared the results to the patient's prior recorded data. LVAD interrogation was NEGATIVE for significant power changes, negative for clinical alarms and STABLE for PI events/speed drops. No programming changes were made and pump is functioning within specified parameters. Pt is performing daily controller and system monitor self tests along with completing weekly and  monthly maintenance for LVAD equipment.   LVAD equipment check completed and is in good working order. Back up equipment not present.     Exit Site Care: Drive line is being maintained weekly by Vikki Ports, his wife. Exit site healed and incorporated, the velour is fully implanted at exit site. No redness, drainage or foul odor noted.  Past Medical History:  Diagnosis Date   AICD (automatic cardioverter/defibrillator) present    Anemia    Anxiety    CAD (coronary artery disease) 2009   AMI in 12/2007 with PCI to LAD, staged PCI to  mid/distal RCA, NSTEMI in 02/2009 with BMS to LCx   CHF (congestive heart failure) (HCC)    Chronic kidney disease 11/03/2016   stage 3 kidney disease   Dysrhythmia    "arrythmia problems at some point", "fatal rhythms"   History of blood transfusion    "I was bleeding from was rectum" (04/19/2017)   HLD (hyperlipidemia)    HTN (hypertension)    Hyperlipidemia 03/15/2011   Ischemic cardiomyopathy    Admitted in 07/2010 with CHF exacerbation   Myocardial infarction (HCC)    "I've had 4" (04/19/2017)   Pneumonia 2018 X 1   Seizures (HCC)    one seizure in 04/2016 during cardiac event (04/19/2017)   STEMI 2009 (anterior), 2010 (lateral), and 2012 (inferior) 03/14/2011    Current Outpatient Medications  Medication Sig Dispense Refill   acetaminophen (TYLENOL) 325 MG tablet Take 2 tablets (650 mg total) by mouth every 4 (four) hours as needed for headache  or mild pain.     amiodarone (PACERONE) 200 MG tablet Take 1 tablet (200 mg total) by mouth daily. 90 tablet 3   amLODipine (NORVASC) 5 MG tablet Take 1 tablet (5 mg total) by mouth daily. 90 tablet 3   atorvastatin (LIPITOR) 40 MG tablet Take 1 tablet (40 mg total) by mouth daily. 90 tablet 6   fluticasone (FLONASE) 50 MCG/ACT nasal spray Place 2 sprays into both nostrils daily. 9.9 mL 3   losartan (COZAAR) 50 MG tablet Take 2 tablets (100 mg total) by mouth daily. 180 tablet 3   magnesium oxide (MAG-OX)  400 (240 Mg) MG tablet Take 1 tablet (400 mg total) by mouth 2 (two) times daily. 60 tablet 0   Mouthwashes (MOUTH RINSE) LIQD solution 15 mLs by Mouth Rinse route 2 (two) times daily.  0   Multiple Vitamin (MULTIVITAMIN WITH MINERALS) TABS tablet Take 1 tablet by mouth daily. 30 tablet 6   pantoprazole (PROTONIX) 40 MG tablet Take 1 tablet (40 mg total) by mouth daily. 90 tablet 6   polyethylene glycol (MIRALAX / GLYCOLAX) 17 g packet Take 17 g by mouth daily. 30 each 0   polyvinyl alcohol (LIQUIFILM TEARS) 1.4 % ophthalmic solution Place 2 drops into both eyes as needed for dry eyes. 15 mL 0   tamsulosin (FLOMAX) 0.4 MG CAPS capsule Take 1 capsule by mouth once daily 90 capsule 3   traZODone (DESYREL) 50 MG tablet Take 1 tablet (50 mg total) by mouth at bedtime. 90 tablet 3   warfarin (COUMADIN) 2 MG tablet Take 2 mg (1 tablet) every Sun, MWF and 1 mg (0.5 tab) all other days or as directed by HF Clinic. 30 tablet 11   Blood Pressure Monitoring (BLOOD PRESSURE CUFF) MISC 1 Device by Does not apply route daily. (Patient not taking: Reported on 05/10/2022) 1 each 0   senna (SENOKOT) 8.6 MG TABS tablet Take 2 tablets (17.2 mg total) by mouth at bedtime. (Patient not taking: Reported on 07/18/2022) 120 tablet 0   topiramate (TOPAMAX) 50 MG tablet Take 1 tablet by mouth twice daily (Patient not taking: Reported on 05/10/2022) 60 tablet 0   No current facility-administered medications for this encounter.    Plavix [clopidogrel bisulfate]  Review of systems complete and found to be negative unless listed in HPI.      Vitals:   07/18/22 1127 07/18/22 1130  BP: (!) 90/0 99/75  Pulse: 71       Wt Readings from Last 3 Encounters:  07/18/22 79.1 kg (174 lb 6.4 oz)  05/10/22 73.8 kg (162 lb 9.6 oz)  02/17/22 73.4 kg (161 lb 12.8 oz)    Vital Signs:  Doppler Pressure: 86 Automatc BP: 92/64 (76) HR: 69 NSR SPO2: UTO    Weight: 178.8 lb w/ eqt Last weight: 174.4 lb w/ eqt    Physical  Exam: General:  NAD.  HEENT: normal  Neck: supple. JVP not elevated.  Carotids 2+ bilat; no bruits. No lymphadenopathy or thryomegaly appreciated. Cor: LVAD hum.  Lungs: Clear. Abdomen:soft, nontender, non-distended. No hepatosplenomegaly. No bruits or masses. Good bowel sounds. Driveline site clean. Anchor in place.  Extremities: no cyanosis, clubbing, rash. Warm no edema  Neuro: alert & oriented x 3. No focal deficits. Moves all 4 without problem   ASSESSMENT AND PLAN:  1. Chronic systolic CHF with HM III LVAD implant 04/20/17:  - Doing very well - NYHA I - Volume status ok - He has been seen at [facility] for  transplant referral but has decided not to pursue VAD.   2.. PAF/AFL - had DC-CV on 10/17/18 with subsequent short episodes of AF on amio 100 daily - Had recurrent AF in 10/22. Amio increased to 200 bid and converted.  - Remains in NSR  - I had planned to cut amio down to 100 daily previously but had ~13% AF on device interrogation so we left amio at 200 daily. Has f/u with Dr. Graciela Husbands scheduled to re-evaluate. - Recent amio labs ok .  3. SAH - admit in 2/23 with [facility] requiring ventriculostomy - much improved. HAs resolved.  - BP well controlled - No change. No residual on exam    4. h/o UGI AVM bleed - s/p APC x 4 lesions in 2/19 - N orecurrent bleeding - Continue PPI - Off ASA due to GIB.  - Hgb stable at 13.6   5. CKD Stage IIIa:  - Creatinine baseline 1.3 - 1.7  - Creatinine 1.80 today  - Continue to follow - Consider SGLT2i at next visit  6. VAD management - VAD interrogated personally. Parameters stable.. - LDH 195 - INR goal 1.5-2.0 with h/o SAH Off ASA   - INR  2.1 Discussed dosing with PharmD personally. - MAPs good - DL looks ok  7. CAD:  s/p PCI RCA/PLOM with DES x2 and DES to mild LAD 1/18 - No s/s angina - Continue statin. Off ASA due to GIB  8. Hypomagnesemia. -  Mag 1.8 Continue mag supplements  9. HTN - needs tight BP control with h/o SAH.  -  Much improved with addition of amlodipine - MAPs look good   10. [employer] fan - unfortunately this has been incurable even with VAD implant - I congratulated him on [employer] baseball playing so well in the [employer]   Total time spent 40 minutes. Over half that time spent discussing above.    Arvilla Meres, MD  10:13 PM

## 2022-10-19 NOTE — Progress Notes (Addendum)
Patient presents for 2 mo follow up with 5.5 year Intermacs in VAD Clinic today with Vikki Ports) wife. Denies issues with VAD equipment or drive line.   Denies lightheadedness, dizziness, falls, heart failure symptoms, headaches, and signs of bleeding. Reports mild shortness of breath with exertion. He admits he is mostly sedentary. Encouraged him to ambulate around the house/outside for at least 30 minutes daily to improve exercise tolerance. He verbalized understanding.   EKG obtained today (SR 1st degree HB rate 75)- reviewed with Dr [name]. Will continue Amiodarone 200 mg daily. Pt has follow up with Dr Graciela Husbands 01/23/23.  Vikki Ports reports pt was experiencing intermittent beeping while on batteries. She changed out clips, and issue has resolved. Requesting a replacement set of clips. Will order a new set of clips today. Offered to provide loaner clips today, but she declined.   Provided with 4 belt controller holders today.   Vital Signs:  Doppler Pressure: 86 Automatc BP: 92/64 (76) HR: 69 NSR SPO2: UTO    Weight: 178.8 lb w/ eqt Last weight: 174.4 lb w/ eqt  VAD Indication: Destination therapy- CKD   VAD interrogation & Equipment Management: Speed: 5400 Flow: 4.9 Power: 4.1w    PI: 3.1   Alarms: few low voltage advisories  Events: 10 - 20 PI events daily  Fixed speed 5400 Low speed limit: 5100   Primary Controller:  Replace back up battery in 17 months Back up controller:  Replace back up battery in 13 months   Annual Equipment Maintenance on UBC/PM was performed on 05/10/2022.   I reviewed the LVAD parameters from today and compared the results to the patient's prior recorded data. LVAD interrogation was NEGATIVE for significant power changes, negative for clinical alarms and STABLE for PI events/speed drops. No programming changes were made and pump is functioning within specified parameters. Pt is performing daily controller and system monitor self tests along with  completing weekly and monthly maintenance for LVAD equipment.   LVAD equipment check completed and is in good working order. Back up equipment present at this appointment.  Exit Site Care: Drive line is being maintained weekly by Vikki Ports, his wife. Exit site healed and incorporated, the velour is fully implanted at exit site. No redness, drainage, rash, or foul odor noted. Pt denies fever or chills. Pt given 8 weekly dressings, 8 anchors, and adhesive remover for home use.  Device: Medtronic Bi-V Therapies: VF 200 VT 167 Pacing:  VVI 50 Last check: 07/28/22  BP & Labs:  Doppler BP 86  - Doppler is reflecting MAP  Hgb 13.6 - No S/S of bleeding. Specifically denies melena/BRBPR or nosebleeds.  LDH stable at 194 with established baseline of 165- 250. Denies tea-colored urine. No power elevations noted on interrogation.    5.5 year Intermacs follow up completed including:  Quality of Life, KCCQ-12, and Neurocognitive trail making.   Pt refused 6 minute walk  Back up controller: 11V backup battery charged during this visit.  Patient Goals: To increase physical activity   Patient Instructions:  No medication changes  Coumadin dosing per Lauren PharmD Return to VAD clinic in 2 months  Alyce Pagan RN VAD Coordinator  Office: [phone]  24/7 Pager: [phone]

## 2022-10-24 ENCOUNTER — Ambulatory Visit (HOSPITAL_COMMUNITY): Payer: Self-pay | Admitting: Pharmacist

## 2022-10-24 LAB — POCT INR: INR: 2.4 (ref 2.0–3.0)

## 2022-10-26 ENCOUNTER — Ambulatory Visit (INDEPENDENT_AMBULATORY_CARE_PROVIDER_SITE_OTHER): Payer: Medicare HMO

## 2022-10-26 DIAGNOSIS — I255 Ischemic cardiomyopathy: Secondary | ICD-10-CM

## 2022-10-28 LAB — CUP PACEART REMOTE DEVICE CHECK
Battery Remaining Longevity: 32 mo
Battery Voltage: 2.94 V
Brady Statistic AP VP Percent: 0 %
Brady Statistic AP VS Percent: 0 %
Brady Statistic AS VP Percent: 0.09 %
Brady Statistic AS VS Percent: 99.91 %
Brady Statistic RA Percent Paced: 0 %
Brady Statistic RV Percent Paced: 0.11 %
Date Time Interrogation Session: 20240703202728
HighPow Impedance: 65 Ohm
Implantable Lead Connection Status: 753985
Implantable Lead Connection Status: 753985
Implantable Lead Connection Status: 753985
Implantable Lead Connection Status: 753985
Implantable Lead Implant Date: 20180409
Implantable Lead Implant Date: 20180409
Implantable Lead Implant Date: 20180716
Implantable Lead Implant Date: 20180716
Implantable Lead Location: 753858
Implantable Lead Location: 753858
Implantable Lead Location: 753859
Implantable Lead Location: 753860
Implantable Lead Model: 5076
Implantable Lead Model: 511212
Implantable Lead Model: 511212
Implantable Lead Serial Number: 263773
Implantable Lead Serial Number: 263775
Implantable Pulse Generator Implant Date: 20180409
Lead Channel Impedance Value: 247 Ohm
Lead Channel Impedance Value: 3249 Ohm
Lead Channel Impedance Value: 342 Ohm
Lead Channel Impedance Value: 342 Ohm
Lead Channel Impedance Value: 4047 Ohm
Lead Channel Impedance Value: 836 Ohm
Lead Channel Pacing Threshold Amplitude: 0.625 V
Lead Channel Pacing Threshold Amplitude: 1.125 V
Lead Channel Pacing Threshold Amplitude: 1.75 V
Lead Channel Pacing Threshold Pulse Width: 0.4 ms
Lead Channel Pacing Threshold Pulse Width: 0.4 ms
Lead Channel Pacing Threshold Pulse Width: 1 ms
Lead Channel Sensing Intrinsic Amplitude: 11.375 mV
Lead Channel Sensing Intrinsic Amplitude: 11.375 mV
Lead Channel Sensing Intrinsic Amplitude: 2.75 mV
Lead Channel Sensing Intrinsic Amplitude: 2.75 mV
Lead Channel Setting Pacing Amplitude: 2.5 V
Lead Channel Setting Pacing Pulse Width: 0.4 ms
Lead Channel Setting Sensing Sensitivity: 0.6 mV
Zone Setting Status: 755011

## 2022-10-31 ENCOUNTER — Ambulatory Visit (HOSPITAL_COMMUNITY): Payer: Self-pay | Admitting: Pharmacist

## 2022-10-31 LAB — POCT INR: INR: 2.3 (ref 2.0–3.0)

## 2022-11-08 ENCOUNTER — Other Ambulatory Visit (HOSPITAL_COMMUNITY): Payer: Self-pay | Admitting: Internal Medicine

## 2022-11-08 ENCOUNTER — Ambulatory Visit (HOSPITAL_COMMUNITY): Payer: Self-pay | Admitting: Pharmacist

## 2022-11-08 DIAGNOSIS — Z95811 Presence of heart assist device: Secondary | ICD-10-CM

## 2022-11-08 LAB — POCT INR: INR: 1.4 — AB (ref 2.0–3.0)

## 2022-11-14 ENCOUNTER — Ambulatory Visit (HOSPITAL_COMMUNITY): Payer: Self-pay | Admitting: Pharmacist

## 2022-11-14 LAB — POCT INR: INR: 2 (ref 2.0–3.0)

## 2022-11-17 NOTE — Progress Notes (Signed)
Remote ICD transmission.   

## 2022-11-21 ENCOUNTER — Ambulatory Visit (HOSPITAL_COMMUNITY): Payer: Self-pay | Admitting: Student-PharmD

## 2022-11-21 LAB — POCT INR: INR: 1.8 — AB (ref 2.0–3.0)

## 2022-11-25 ENCOUNTER — Other Ambulatory Visit (HOSPITAL_COMMUNITY): Payer: Self-pay | Admitting: Internal Medicine

## 2022-11-25 DIAGNOSIS — Z95811 Presence of heart assist device: Secondary | ICD-10-CM

## 2022-11-28 ENCOUNTER — Ambulatory Visit (HOSPITAL_COMMUNITY): Payer: Self-pay | Admitting: Pharmacist

## 2022-11-28 LAB — POCT INR: INR: 2.7 (ref 2.0–3.0)

## 2022-12-05 ENCOUNTER — Ambulatory Visit (HOSPITAL_COMMUNITY): Payer: Self-pay | Admitting: Pharmacist

## 2022-12-05 LAB — POCT INR: INR: 2.3 (ref 2.0–3.0)

## 2022-12-07 ENCOUNTER — Other Ambulatory Visit (HOSPITAL_COMMUNITY): Payer: Self-pay | Admitting: Internal Medicine

## 2022-12-10 ENCOUNTER — Other Ambulatory Visit (HOSPITAL_COMMUNITY): Payer: Self-pay | Admitting: Internal Medicine

## 2022-12-10 DIAGNOSIS — I10 Essential (primary) hypertension: Secondary | ICD-10-CM

## 2022-12-10 DIAGNOSIS — G4700 Insomnia, unspecified: Secondary | ICD-10-CM

## 2022-12-10 DIAGNOSIS — Z95811 Presence of heart assist device: Secondary | ICD-10-CM

## 2022-12-12 ENCOUNTER — Ambulatory Visit (HOSPITAL_COMMUNITY): Payer: Self-pay | Admitting: Pharmacist

## 2022-12-12 LAB — POCT INR: INR: 2.6 (ref 2.0–3.0)

## 2022-12-12 NOTE — Progress Notes (Signed)
LVAD INR 

## 2022-12-19 ENCOUNTER — Ambulatory Visit (HOSPITAL_COMMUNITY): Payer: Self-pay | Admitting: Pharmacist

## 2022-12-19 LAB — POCT INR: INR: 2 (ref 2.0–3.0)

## 2022-12-27 ENCOUNTER — Ambulatory Visit (HOSPITAL_COMMUNITY): Payer: Self-pay | Admitting: Pharmacist

## 2022-12-27 LAB — POCT INR: INR: 2.9 (ref 2.0–3.0)

## 2023-01-02 ENCOUNTER — Ambulatory Visit (HOSPITAL_COMMUNITY): Payer: Self-pay | Admitting: Pharmacist

## 2023-01-02 LAB — POCT INR: INR: 2.3 (ref 2.0–3.0)

## 2023-01-02 NOTE — Progress Notes (Signed)
LVAD INR 

## 2023-01-04 ENCOUNTER — Other Ambulatory Visit (HOSPITAL_COMMUNITY): Payer: Self-pay | Admitting: *Deleted

## 2023-01-04 DIAGNOSIS — Z7901 Long term (current) use of anticoagulants: Secondary | ICD-10-CM

## 2023-01-04 DIAGNOSIS — Z79899 Other long term (current) drug therapy: Secondary | ICD-10-CM

## 2023-01-04 DIAGNOSIS — Z95811 Presence of heart assist device: Secondary | ICD-10-CM

## 2023-01-04 DIAGNOSIS — I255 Ischemic cardiomyopathy: Secondary | ICD-10-CM

## 2023-01-06 ENCOUNTER — Ambulatory Visit (HOSPITAL_COMMUNITY): Payer: Self-pay | Admitting: Pharmacist

## 2023-01-06 ENCOUNTER — Encounter (HOSPITAL_COMMUNITY): Payer: Self-pay | Admitting: Internal Medicine

## 2023-01-06 ENCOUNTER — Ambulatory Visit (HOSPITAL_COMMUNITY)
Admission: RE | Admit: 2023-01-06 | Discharge: 2023-01-06 | Disposition: A | Discharge status: Home / Self Care | Payer: Medicare HMO | Source: Ambulatory Visit | Attending: Internal Medicine | Admitting: Internal Medicine

## 2023-01-06 VITALS — BP 100/75 | HR 77 | Wt 175.6 lb

## 2023-01-06 DIAGNOSIS — I5022 Chronic systolic (congestive) heart failure: Secondary | ICD-10-CM | POA: Insufficient documentation

## 2023-01-06 DIAGNOSIS — Z955 Presence of coronary angioplasty implant and graft: Secondary | ICD-10-CM | POA: Insufficient documentation

## 2023-01-06 DIAGNOSIS — Z95810 Presence of automatic (implantable) cardiac defibrillator: Secondary | ICD-10-CM | POA: Insufficient documentation

## 2023-01-06 DIAGNOSIS — I10 Essential (primary) hypertension: Secondary | ICD-10-CM

## 2023-01-06 DIAGNOSIS — I2510 Atherosclerotic heart disease of native coronary artery without angina pectoris: Secondary | ICD-10-CM | POA: Diagnosis not present

## 2023-01-06 DIAGNOSIS — I480 Paroxysmal atrial fibrillation: Secondary | ICD-10-CM | POA: Insufficient documentation

## 2023-01-06 DIAGNOSIS — I484 Atypical atrial flutter: Secondary | ICD-10-CM | POA: Diagnosis not present

## 2023-01-06 DIAGNOSIS — Z4509 Encounter for adjustment and management of other cardiac device: Secondary | ICD-10-CM | POA: Insufficient documentation

## 2023-01-06 DIAGNOSIS — I255 Ischemic cardiomyopathy: Secondary | ICD-10-CM | POA: Diagnosis not present

## 2023-01-06 DIAGNOSIS — I130 Hypertensive heart and chronic kidney disease with heart failure and stage 1 through stage 4 chronic kidney disease, or unspecified chronic kidney disease: Secondary | ICD-10-CM | POA: Diagnosis not present

## 2023-01-06 DIAGNOSIS — Z7901 Long term (current) use of anticoagulants: Secondary | ICD-10-CM

## 2023-01-06 DIAGNOSIS — Z79899 Other long term (current) drug therapy: Secondary | ICD-10-CM | POA: Insufficient documentation

## 2023-01-06 DIAGNOSIS — N1831 Chronic kidney disease, stage 3a: Secondary | ICD-10-CM | POA: Insufficient documentation

## 2023-01-06 DIAGNOSIS — Z95811 Presence of heart assist device: Secondary | ICD-10-CM | POA: Diagnosis not present

## 2023-01-06 DIAGNOSIS — I110 Hypertensive heart disease with heart failure: Secondary | ICD-10-CM | POA: Diagnosis present

## 2023-01-06 DIAGNOSIS — I609 Nontraumatic subarachnoid hemorrhage, unspecified: Secondary | ICD-10-CM

## 2023-01-06 LAB — CBC
HCT: 43.9 % (ref 39.0–52.0)
Hemoglobin: 13.6 g/dL (ref 13.0–17.0)
MCH: 25 pg — ABNORMAL LOW (ref 26.0–34.0)
MCHC: 31 g/dL (ref 30.0–36.0)
MCV: 80.7 fL (ref 80.0–100.0)
Platelets: 261 10*3/uL (ref 150–400)
RBC: 5.44 MIL/uL (ref 4.22–5.81)
RDW: 14.8 % (ref 11.5–15.5)
WBC: 9.9 10*3/uL (ref 4.0–10.5)
nRBC: 0 % (ref 0.0–0.2)

## 2023-01-06 LAB — COMPREHENSIVE METABOLIC PANEL
ALT: 16 U/L (ref 0–44)
AST: 21 U/L (ref 15–41)
Albumin: 2.9 g/dL — ABNORMAL LOW (ref 3.5–5.0)
Alkaline Phosphatase: 117 U/L (ref 38–126)
Anion gap: 13 (ref 5–15)
BUN: 19 mg/dL (ref 6–20)
CO2: 24 mmol/L (ref 22–32)
Calcium: 9 mg/dL (ref 8.9–10.3)
Chloride: 102 mmol/L (ref 98–111)
Creatinine, Ser: 1.92 mg/dL — ABNORMAL HIGH (ref 0.61–1.24)
GFR, Estimated: 40 mL/min — ABNORMAL LOW (ref >60)
Glucose, Bld: 63 mg/dL — ABNORMAL LOW (ref 70–99)
Potassium: 4.5 mmol/L (ref 3.5–5.1)
Sodium: 139 mmol/L (ref 135–145)
Total Bilirubin: 0.6 mg/dL (ref 0.3–1.2)
Total Protein: 7.9 g/dL (ref 6.5–8.1)

## 2023-01-06 LAB — TSH: TSH: 3.052 u[IU]/mL (ref 0.350–4.500)

## 2023-01-06 LAB — PROTIME-INR
INR: 1.9 — ABNORMAL HIGH (ref 0.8–1.2)
Prothrombin Time: 22.2 s — ABNORMAL HIGH (ref 11.4–15.2)

## 2023-01-06 LAB — PREALBUMIN: Prealbumin: 25 mg/dL (ref 18–38)

## 2023-01-06 LAB — T4, FREE: Free T4: 1.28 ng/dL — ABNORMAL HIGH (ref 0.61–1.12)

## 2023-01-06 LAB — LACTATE DEHYDROGENASE: LDH: 226 U/L — ABNORMAL HIGH (ref 98–192)

## 2023-01-06 NOTE — Progress Notes (Signed)
LVAD Clinic Follow Up Note HF MD: Dr [name]  HPI: David [name] is a 57 y.o. male with history of ICD (09 PCI to LAD, mRCA, '10 PCI to Lcx and '12 mRCA), HFrEF (EF ~20% for > 5 years), HLD and HTN. S/P LVAD HM-III 04/20/18.    Admitted 04/19/17 - 05/27/2017 for scheduled LVAD implant. On 12/27 he underwent HMIII LVAD implant. Hospital course c/b PNA shock liver and AKI. Required short term HD.  Readmitted 05/30/17 hgb 3.8. INR 2.9.transfused 4u pRBCs. 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 Underwent ventriculostomy. Admission c/b by periods of confusion and urinary retention. Discharged to [facility]  Returns for routine f/u with his wife. Feels good. Doing well. No CP or SOB bt not very active. Denies orthopnea or PND. No fevers, chills or problems with driveline. No bleeding, melena or neuro symptoms. No VAD alarms. Taking all meds as prescribed.    VAD Indication: Destination therapy- CKD   VAD interrogation & Equipment Management: Speed: 5400 Flow: 4.7 Power: 4.0 w    PI: 4.3   Alarms: none Events: 10 - 20 PI events daily  Fixed speed 5400 Low speed limit: 5100   Primary Controller:  Replace back up battery in 14 months Back up controller:  Replace back up battery in 13 months   Annual Equipment Maintenance on UBC/PM was performed on 05/10/2022.   I reviewed the LVAD parameters from today and compared the results to the patient's prior recorded data. LVAD interrogation was NEGATIVE for significant power changes, negative for clinical alarms and STABLE for PI events/speed drops. No programming changes were made and pump is functioning within specified parameters. Pt is performing daily controller and system monitor self tests along with completing weekly and monthly maintenance for LVAD equipment.   LVAD equipment check completed and is in  good working order. Back up equipment present at this appointment.  Past Medical History:  Diagnosis Date   AICD (automatic cardioverter/defibrillator) present    Anemia    Anxiety    CAD (coronary artery disease) 2009   AMI in 12/2007 with PCI to LAD, staged PCI to  mid/distal RCA, NSTEMI in 02/2009 with BMS to LCx   CHF (congestive heart failure) (HCC)    Chronic kidney disease 11/03/2016   stage 3 kidney disease   Dysrhythmia    "arrythmia problems at some point", "fatal rhythms"   History of blood transfusion    "I was bleeding from was rectum" (04/19/2017)   HLD (hyperlipidemia)    HTN (hypertension)    Hyperlipidemia 03/15/2011   Ischemic cardiomyopathy    Admitted in 07/2010 with CHF exacerbation   Myocardial infarction (HCC)    "I've had 4" (04/19/2017)   Pneumonia 2018 X 1   Seizures (HCC)    one seizure in 04/2016 during cardiac event (04/19/2017)   STEMI 2009 (anterior), 2010 (lateral), and 2012 (inferior) 03/14/2011    Current Outpatient Medications  Medication Sig Dispense Refill   acetaminophen (TYLENOL) 325 MG tablet Take 2 tablets (650 mg total) by mouth every 4 (four) hours as needed for headache or mild pain.     amiodarone (PACERONE) 200 MG tablet Take 1 tablet (200 mg total) by mouth daily. 90 tablet 3   amLODipine (NORVASC) 5 MG tablet Take 1 tablet (5 mg total) by mouth daily. 90 tablet 3  atorvastatin (LIPITOR) 40 MG tablet Take 1 tablet by mouth once daily 90 tablet 3   fluticasone (FLONASE) 50 MCG/ACT nasal spray Place 2 sprays into both nostrils daily. 9.9 mL 3   losartan (COZAAR) 50 MG tablet Take 2 tablets by mouth once daily 180 tablet 0   magnesium oxide (MAG-OX) 400 (240 Mg) MG tablet Take 1 tablet (400 mg total) by mouth 2 (two) times daily. 60 tablet 0   Mouthwashes (MOUTH RINSE) LIQD solution 15 mLs by Mouth Rinse route 2 (two) times daily.  0   Multiple Vitamin (MULTIVITAMIN WITH MINERALS) TABS tablet Take 1 tablet by mouth daily. 30 tablet 6    pantoprazole (PROTONIX) 40 MG tablet Take 1 tablet by mouth once daily 90 tablet 3   polyethylene glycol (MIRALAX / GLYCOLAX) 17 g packet Take 17 g by mouth daily. 30 each 0   senna (SENOKOT) 8.6 MG TABS tablet Take 2 tablets (17.2 mg total) by mouth at bedtime. 120 tablet 0   tamsulosin (FLOMAX) 0.4 MG CAPS capsule Take 1 capsule by mouth once daily 90 capsule 3   traZODone (DESYREL) 50 MG tablet TAKE 1 TABLET BY MOUTH AT BEDTIME 90 tablet 0   warfarin (COUMADIN) 2 MG tablet TAKE 2 MG (1 TABLET) EVERY SUN, MON, WED, AND FRIDAY, AND TAKE 1 MG (1/2 TABLET) ALL OTHER DAYS OR AS DIRECTED BY HF CLINIC 30 tablet 6   Blood Pressure Monitoring (BLOOD PRESSURE CUFF) MISC 1 Device by Does not apply route daily. (Patient not taking: Reported on 05/10/2022) 1 each 0   polyvinyl alcohol (LIQUIFILM TEARS) 1.4 % ophthalmic solution Place 2 drops into both eyes as needed for dry eyes. (Patient not taking: Reported on 10/19/2022) 15 mL 0   topiramate (TOPAMAX) 50 MG tablet Take 1 tablet by mouth twice daily (Patient not taking: Reported on 05/10/2022) 60 tablet 0   No current facility-administered medications for this encounter.    Plavix [clopidogrel bisulfate]  Review of systems complete and found to be negative unless listed in HPI.      Vitals:   01/06/23 1118 01/06/23 1130  BP: (!) 78/0 100/75  Pulse: 77     Wt Readings from Last 3 Encounters:  01/06/23 79.7 kg (175 lb 9.6 oz)  10/19/22 81.1 kg (178 lb 12.8 oz)  07/18/22 79.1 kg (174 lb 6.4 oz)    Vital Signs:  Doppler Pressure: 78 Automatc BP: 100/75 (85) HR: 77 NSR SPO2: UTO    Weight: 175.6 lb w/ eqt Last weight: 178.8 lb w/ eqt    Physical Exam: General:  NAD.  HEENT: normal  Neck: supple. JVP not elevated.  Carotids 2+ bilat; no bruits. No lymphadenopathy or thryomegaly appreciated. Cor: LVAD hum.  Lungs: Clear. Abdomen: soft, nontender, non-distended. No hepatosplenomegaly. No bruits or masses. Good bowel sounds. Driveline  site clean. Anchor in place.  Extremities: no cyanosis, clubbing, rash. Warm no edema  Neuro: alert & oriented x 3. No focal deficits. Moves all 4 without problem   ASSESSMENT AND PLAN:  1. Chronic systolic CHF with HM III LVAD implant 04/20/17:  - Doing well. - NYHA I-II but not very active - Volume ok - Encouraged him to be more active with riding exercise bike 64mins/day - He has been seen at [facility] for transplant referral but has decided not to pursue VAD.   2.. PAF/AFL - had DC-CV on 10/17/18 with subsequent short episodes of AF on amio 100 daily - Had recurrent AF in 10/22. Amio increased to  200 bid and converted.  - Remains in NSR - I had planned to cut amio down to 100 daily previously but had ~13% AF on device interrogation so we left amio at 200 daily. Has f/u with Dr. Graciela Husbands scheduled to re-evaluate. - Continue amio for now  3. SAH - admit in 2/23 with [facility] requiring ventriculostomy - much improved. HAs resolved.  - BP well controlled - No change. No residual on exam   4. h/o UGI AVM bleed - s/p APC x 4 lesions in 2/19 - N orecurrent bleeding - Continue PPI - Off ASA due to GIB.  - Hgb stable at 13.6  5. CKD Stage IIIa:  - Creatinine baseline 1.3 - 1.7  - Creatinine 1.92 today  - Continue to follow - Consider SGLT2i at next visit  6. VAD management - VAD interrogated personally. Parameters stable. - LDH 226 - INR goal 1.5-2.0 with h/o SAH Off ASA   - INR  1.9 Discussed dosing with PharmD personally. - MAPs ok - DL looks good  7. CAD:  s/p PCI RCA/PLOM with DES x2 and DES to mild LAD 1/18 - No s/s angina - Continue statin. Off ASA due to GIB  8. Hypomagnesemia. - Continue mag supplements  9. HTN - needs tight BP control with h/o SAH.  - Much improved with addition of amlodipine - MAPs look good  10. [employer] fan - unfortunately this has been incurable even with VAD implant  Total time spent 40 minutes. Over half that time spent discussing  above.    Arvilla Meres, MD  4:47 PM

## 2023-01-06 NOTE — Patient Instructions (Addendum)
No medication changes Coumadin dosing per Lauren PharmD Return to VAD clinic in 2 month follow up with Dr [name]. Parking code 2003 Increase your exercise to 20 minutes 5 days a week

## 2023-01-06 NOTE — Progress Notes (Signed)
Patient presents for 2 mo follow up in VAD Clinic today with Vikki Ports) wife. Denies issues with VAD equipment or drive line.   Denies lightheadedness, dizziness, falls, heart failure symptoms, headaches, and signs of bleeding. Reports mild shortness of breath with exertion. He admits he is mostly sedentary. Encouraged him to ambulate around the house/outside for at least 20 minutes daily to improve exercise tolerance. He verbalized understanding.   Last visit Vikki Ports reported pt was experiencing intermittent beeping while on batteries. She changed out clips, and issue has resolved. Provided with new set of clips today- Lot # [id number].  Vital Signs:  Doppler Pressure: 78 Automatc BP: 100/75 (85) HR: 77 NSR SPO2: UTO    Weight: 175.6 lb w/ eqt Last weight: 178.8 lb w/ eqt  VAD Indication: Destination therapy- CKD   VAD interrogation & Equipment Management: Speed: 5400 Flow: 4.7 Power: 4.0 w    PI: 4.3   Alarms: none Events: 10 - 20 PI events daily  Fixed speed 5400 Low speed limit: 5100   Primary Controller:  Replace back up battery in 14 months Back up controller:  Replace back up battery in 13 months   Annual Equipment Maintenance on UBC/PM was performed on 05/10/2022.   I reviewed the LVAD parameters from today and compared the results to the patient's prior recorded data. LVAD interrogation was NEGATIVE for significant power changes, negative for clinical alarms and STABLE for PI events/speed drops. No programming changes were made and pump is functioning within specified parameters. Pt is performing daily controller and system monitor self tests along with completing weekly and monthly maintenance for LVAD equipment.   LVAD equipment check completed and is in good working order. Back up equipment present at this appointment.  Exit Site Care: Drive line is being maintained weekly by Vikki Ports, his wife. Exit site healed and incorporated, the velour is fully implanted at exit  site. No redness, drainage, rash, or foul odor noted. Pt denies fever or chills. Pt given 8 weekly dressings for home use.   Device: Medtronic Bi-V Therapies: VF 200 VT 167 Pacing:  VVI 50 Last check: 10/26/22  BP & Labs:  Doppler BP 78  - Doppler is reflecting MAP  Hgb pending - No S/S of bleeding. Specifically denies melena/BRBPR or nosebleeds.  LDH stable at pending with established baseline of 165- 250. Denies tea-colored urine. No power elevations noted on interrogation.     Patient Instructions:  No medication changes Coumadin dosing per Lauren PharmD Return to VAD clinic in 2 month follow up with Dr [name]. Parking code 2003 Increase your exercise to 20 minutes 5 days a week  Alyce Pagan RN VAD Coordinator  Office: [phone]  24/7 Pager: [phone]

## 2023-01-07 LAB — T3, FREE: T3, Free: 2.5 pg/mL (ref 2.0–4.4)

## 2023-01-09 ENCOUNTER — Ambulatory Visit (HOSPITAL_COMMUNITY): Payer: Self-pay | Admitting: Pharmacist

## 2023-01-09 LAB — POCT INR: INR: 2.2 (ref 2.0–3.0)

## 2023-01-16 ENCOUNTER — Ambulatory Visit (HOSPITAL_COMMUNITY): Payer: Self-pay | Admitting: Pharmacist

## 2023-01-16 LAB — POCT INR: INR: 1.9 — AB (ref 2.0–3.0)

## 2023-01-23 ENCOUNTER — Ambulatory Visit: Payer: Medicare HMO | Admitting: Internal Medicine

## 2023-01-23 ENCOUNTER — Ambulatory Visit (HOSPITAL_COMMUNITY): Payer: Self-pay | Admitting: Pharmacist

## 2023-01-23 LAB — POCT INR: INR: 1.9 — AB (ref 2.0–3.0)

## 2023-01-26 ENCOUNTER — Ambulatory Visit (INDEPENDENT_AMBULATORY_CARE_PROVIDER_SITE_OTHER): Payer: Medicare HMO

## 2023-01-26 ENCOUNTER — Telehealth: Payer: Self-pay

## 2023-01-26 DIAGNOSIS — I255 Ischemic cardiomyopathy: Secondary | ICD-10-CM | POA: Diagnosis not present

## 2023-01-26 LAB — CUP PACEART REMOTE DEVICE CHECK
Battery Remaining Longevity: 27 mo
Battery Voltage: 2.94 V
Brady Statistic AP VP Percent: 0 %
Brady Statistic AP VS Percent: 0 %
Brady Statistic AS VP Percent: 0.08 %
Brady Statistic AS VS Percent: 99.92 %
Brady Statistic RA Percent Paced: 0 %
Brady Statistic RV Percent Paced: 0.1 %
Date Time Interrogation Session: 20241003033429
HighPow Impedance: 64 Ohm
Implantable Lead Connection Status: 753985
Implantable Lead Connection Status: 753985
Implantable Lead Connection Status: 753985
Implantable Lead Connection Status: 753985
Implantable Lead Implant Date: 20180409
Implantable Lead Implant Date: 20180409
Implantable Lead Implant Date: 20180716
Implantable Lead Implant Date: 20180716
Implantable Lead Location: 753858
Implantable Lead Location: 753858
Implantable Lead Location: 753859
Implantable Lead Location: 753860
Implantable Lead Model: 5076
Implantable Lead Model: 511212
Implantable Lead Model: 511212
Implantable Lead Serial Number: 263773
Implantable Lead Serial Number: 263775
Implantable Pulse Generator Implant Date: 20180409
Lead Channel Impedance Value: 247 Ohm
Lead Channel Impedance Value: 323 Ohm
Lead Channel Impedance Value: 3287 Ohm
Lead Channel Impedance Value: 342 Ohm
Lead Channel Impedance Value: 4047 Ohm
Lead Channel Impedance Value: 722 Ohm
Lead Channel Pacing Threshold Amplitude: 0.625 V
Lead Channel Pacing Threshold Amplitude: 0.75 V
Lead Channel Pacing Threshold Amplitude: 1.75 V
Lead Channel Pacing Threshold Pulse Width: 0.4 ms
Lead Channel Pacing Threshold Pulse Width: 0.4 ms
Lead Channel Pacing Threshold Pulse Width: 1 ms
Lead Channel Sensing Intrinsic Amplitude: 10.75 mV
Lead Channel Sensing Intrinsic Amplitude: 10.75 mV
Lead Channel Sensing Intrinsic Amplitude: 2.125 mV
Lead Channel Sensing Intrinsic Amplitude: 2.125 mV
Lead Channel Setting Pacing Amplitude: 2.5 V
Lead Channel Setting Pacing Pulse Width: 0.4 ms
Lead Channel Setting Sensing Sensitivity: 0.6 mV
Zone Setting Status: 755011

## 2023-01-26 NOTE — Telephone Encounter (Addendum)
Alert received from [employer]:  Scheduled remote reviewed. Normal device function.   There was one NSVT arrhythmia detected.  There was one VT episode that was successfully converted with one burst of ATP, sent to triage  Outreach made to Pt.  Spoke with Pt wife.  Episode was from 12/30/2022.  Pt overdue for follow up with EP.  Per wife, she can only bring Pt for visits on Friday's.  She had attempted to follow up before, but due to long wait times they had left.  Pt scheduled to see AT next available on a Friday 03/03/2023. Pt does not drive.

## 2023-01-30 ENCOUNTER — Ambulatory Visit (HOSPITAL_COMMUNITY): Payer: Self-pay | Admitting: Pharmacist

## 2023-01-30 LAB — POCT INR: INR: 2 (ref 2.0–3.0)

## 2023-02-04 ENCOUNTER — Other Ambulatory Visit (HOSPITAL_COMMUNITY): Payer: Self-pay | Admitting: Internal Medicine

## 2023-02-04 DIAGNOSIS — Z95811 Presence of heart assist device: Secondary | ICD-10-CM

## 2023-02-04 DIAGNOSIS — I4892 Unspecified atrial flutter: Secondary | ICD-10-CM

## 2023-02-06 ENCOUNTER — Ambulatory Visit (HOSPITAL_COMMUNITY): Payer: Self-pay | Admitting: Pharmacist

## 2023-02-06 LAB — POCT INR: INR: 2.6 (ref 2.0–3.0)

## 2023-02-10 ENCOUNTER — Ambulatory Visit: Payer: Medicare HMO | Admitting: Student

## 2023-02-10 NOTE — Progress Notes (Deleted)
Electrophysiology Office Note:   ID:  David [name], DOB [birth year], MRN [id number]  Primary Cardiologist: Arvilla Meres, MD Electrophysiologist: Sherryl Manges, MD  {Click to update primary MD,subspecialty MD or APP then REFRESH:1}    History of Present Illness:   David [name] is a 57 y.o. male with h/o ICM, HFrEF, HLD, HTN, AFL, and h/o VT and s/p LVAD [facility] 03/2018 seen today for routine electrophysiology followup.   Since last being seen in our clinic the patient reports doing ***.  he denies chest pain, palpitations, dyspnea, PND, orthopnea, nausea, vomiting, dizziness, syncope, edema, weight gain, or early satiety.   Review of systems complete and found to be negative unless listed in HPI.   EP Information / Studies Reviewed:    EKG is not ordered today. EKG from 10/19/2022 reviewed which showed NSR at 75 bpm and QRS of 140 ms       ICD Interrogation-  reviewed in detail today,  See PACEART report.  Device History: Medtronic BiV ICD implanted 2018 for CHF History of appropriate therapy: Yes   Physical Exam:   VS:  There were no vitals taken for this visit.   Wt Readings from Last 3 Encounters:  01/06/23 175 lb 9.6 oz (79.7 kg)  10/19/22 178 lb 12.8 oz (81.1 kg)  07/18/22 174 lb 6.4 oz (79.1 kg)     GEN: Well nourished, well developed in no acute distress NECK: No JVD; No carotid bruits CARDIAC: {EPRHYTHM:28826}, no murmurs, rubs, gallops RESPIRATORY:  Clear to auscultation without rales, wheezing or rhonchi  ABDOMEN: Soft, non-tender, non-distended EXTREMITIES:  No edema; No deformity   ASSESSMENT AND PLAN:    Chronic systolic dysfunction s/p Medtronic CRT-D  euvolemic today Stable on an appropriate medical regimen Normal ICD function See Pace Art report No changes today  PAF/AFL Burden *** by device Continue amiodarone 200 mg daily. Could consider going down to 200 mg M-F (1000 mg total weekly) instead of 100 mg daily (700 mg weekly) Not candidate for  other AADs.  On coumadin for VAD  CAD s/p PCI RAC/PLOM with DES x 2 and DES to mid LAD Denies s/s ischemia  H/o SAH S/p admission 05/2021 requiring vetriculostomy No residual neurological changes  CKD stage IIIa Follow. GDMT per HF/VAD team.  Disposition:   Follow up with {EPPROVIDERS:28135} {EPFOLLOW UP:28173}   Signed, Graciella Freer, PA-C

## 2023-02-10 NOTE — Progress Notes (Signed)
Remote ICD transmission.   

## 2023-02-13 ENCOUNTER — Ambulatory Visit (HOSPITAL_COMMUNITY): Payer: Self-pay | Admitting: Pharmacist

## 2023-02-13 LAB — POCT INR: INR: 1.8 — AB (ref 2.0–3.0)

## 2023-02-20 ENCOUNTER — Ambulatory Visit (HOSPITAL_COMMUNITY): Payer: Self-pay | Admitting: Pharmacist

## 2023-02-20 LAB — POCT INR: INR: 2.2 (ref 2.0–3.0)

## 2023-02-27 ENCOUNTER — Ambulatory Visit (HOSPITAL_COMMUNITY): Payer: Self-pay | Admitting: Pharmacist

## 2023-02-27 ENCOUNTER — Encounter: Payer: Medicare HMO | Admitting: Student

## 2023-02-27 LAB — POCT INR: INR: 2.2 (ref 2.0–3.0)

## 2023-03-03 ENCOUNTER — Encounter: Payer: Medicare HMO | Admitting: Student

## 2023-03-06 ENCOUNTER — Ambulatory Visit (HOSPITAL_COMMUNITY): Payer: Self-pay | Admitting: Pharmacist

## 2023-03-06 LAB — POCT INR: INR: 2.1 (ref 2.0–3.0)

## 2023-03-09 ENCOUNTER — Other Ambulatory Visit (HOSPITAL_COMMUNITY): Payer: Self-pay | Admitting: Internal Medicine

## 2023-03-09 DIAGNOSIS — Z95811 Presence of heart assist device: Secondary | ICD-10-CM

## 2023-03-09 DIAGNOSIS — G4700 Insomnia, unspecified: Secondary | ICD-10-CM

## 2023-03-09 DIAGNOSIS — I10 Essential (primary) hypertension: Secondary | ICD-10-CM

## 2023-03-13 ENCOUNTER — Ambulatory Visit (HOSPITAL_COMMUNITY): Payer: Self-pay | Admitting: Pharmacist

## 2023-03-13 LAB — POCT INR: INR: 1.7 — AB (ref 2.0–3.0)

## 2023-03-17 ENCOUNTER — Encounter: Payer: Self-pay | Admitting: Student

## 2023-03-17 ENCOUNTER — Ambulatory Visit: Payer: Medicare HMO | Attending: Student | Admitting: Student

## 2023-03-17 VITALS — Ht 65.0 in | Wt 178.0 lb

## 2023-03-17 DIAGNOSIS — Z79899 Other long term (current) drug therapy: Secondary | ICD-10-CM

## 2023-03-17 DIAGNOSIS — I5022 Chronic systolic (congestive) heart failure: Secondary | ICD-10-CM | POA: Diagnosis not present

## 2023-03-17 DIAGNOSIS — Z95811 Presence of heart assist device: Secondary | ICD-10-CM

## 2023-03-17 DIAGNOSIS — Z7901 Long term (current) use of anticoagulants: Secondary | ICD-10-CM

## 2023-03-17 DIAGNOSIS — I255 Ischemic cardiomyopathy: Secondary | ICD-10-CM

## 2023-03-17 DIAGNOSIS — I609 Nontraumatic subarachnoid hemorrhage, unspecified: Secondary | ICD-10-CM

## 2023-03-17 LAB — CUP PACEART INCLINIC DEVICE CHECK
Battery Remaining Longevity: 27 mo
Battery Voltage: 2.9 V
Brady Statistic AP VP Percent: 0 %
Brady Statistic AP VS Percent: 0 %
Brady Statistic AS VP Percent: 0.14 %
Brady Statistic AS VS Percent: 99.86 %
Brady Statistic RA Percent Paced: 0 %
Brady Statistic RV Percent Paced: 0.27 %
Date Time Interrogation Session: 20241122125604
HighPow Impedance: 64 Ohm
Implantable Lead Connection Status: 753985
Implantable Lead Connection Status: 753985
Implantable Lead Connection Status: 753985
Implantable Lead Connection Status: 753985
Implantable Lead Implant Date: 20180409
Implantable Lead Implant Date: 20180409
Implantable Lead Implant Date: 20180716
Implantable Lead Implant Date: 20180716
Implantable Lead Location: 753858
Implantable Lead Location: 753858
Implantable Lead Location: 753859
Implantable Lead Location: 753860
Implantable Lead Model: 5076
Implantable Lead Model: 511212
Implantable Lead Model: 511212
Implantable Lead Serial Number: 263773
Implantable Lead Serial Number: 263775
Implantable Pulse Generator Implant Date: 20180409
Lead Channel Impedance Value: 266 Ohm
Lead Channel Impedance Value: 323 Ohm
Lead Channel Impedance Value: 3325 Ohm
Lead Channel Impedance Value: 380 Ohm
Lead Channel Impedance Value: 4047 Ohm
Lead Channel Impedance Value: 779 Ohm
Lead Channel Pacing Threshold Amplitude: 0.625 V
Lead Channel Pacing Threshold Amplitude: 0.875 V
Lead Channel Pacing Threshold Amplitude: 1.75 V
Lead Channel Pacing Threshold Pulse Width: 0.4 ms
Lead Channel Pacing Threshold Pulse Width: 0.4 ms
Lead Channel Pacing Threshold Pulse Width: 1 ms
Lead Channel Sensing Intrinsic Amplitude: 10.75 mV
Lead Channel Sensing Intrinsic Amplitude: 12.5 mV
Lead Channel Sensing Intrinsic Amplitude: 2.25 mV
Lead Channel Sensing Intrinsic Amplitude: 2.5 mV
Lead Channel Setting Pacing Amplitude: 2.5 V
Lead Channel Setting Pacing Pulse Width: 0.4 ms
Lead Channel Setting Sensing Sensitivity: 0.6 mV
Zone Setting Status: 755011

## 2023-03-17 NOTE — Progress Notes (Signed)
  Electrophysiology Office Note:   ID:  David [name], DOB 21-Feb-1966, MRN [id number]  Primary Cardiologist: Arvilla Meres, MD Electrophysiologist: Sherryl Manges, MD      History of Present Illness:   David [name] is a 58 y.o. male with h/o ICM (09 PCI to LAD, mRCA, '10 PCI to Lcx and '12 mRCA), HFrEF (EF ~20% for > 5 years), HLD and HTN. S/P LVAD HM-III 04/20/18  seen today for routine electrophysiology followup.   Since last being seen in our clinic the patient reports doing well from a cardiac perspective. Followed closely by HF team. Not very active per wife. Denies CP and SOB. No orthopnea or PND. No edema or driveline issues.   Review of systems complete and found to be negative unless listed in HPI.   EP Information / Studies Reviewed:    EKG is not ordered today. EKG from 10/19/2022 reviewed which showed NSR at 75 bpm       ICD Interrogation-  reviewed in detail today,  See PACEART report.  Device History: Medtronic BiV ICD implanted 2018 for NICM, CHF History of appropriate therapy: Yes History of AAD therapy: Yes; currently on amiodarone    Physical Exam:   VS:  Ht 5\' 5"  (1.651 m)   Wt 178 lb (80.7 kg)   BMI 29.62 kg/m    Wt Readings from Last 3 Encounters:  03/17/23 178 lb (80.7 kg)  01/06/23 175 lb 9.6 oz (79.7 kg)  10/19/22 178 lb 12.8 oz (81.1 kg)     GEN: Well nourished, well developed in no acute distress NECK: No JVD; No carotid bruits CARDIAC: Regular rate and rhythm, no murmurs, rubs, gallops RESPIRATORY:  Clear to auscultation without rales, wheezing or rhonchi  ABDOMEN: Soft, non-tender, non-distended EXTREMITIES:  No edema; No deformity   ASSESSMENT AND PLAN:    Chronic systolic CHF  s/p Medtronic CRT-D  LVAD in situ euvolemic today Stable on an appropriate medical regimen followed closely by HF team Normal ICD function See Pace Art report No changes today  Paroxysmal AF Atrial flutter 1.7% by device Continue amiodarone 200 mg  daily; If wish to decrease could do down to 200 mg Mon-Friday (1g weekly) rather than 100 mg daily (700mg  weekly) On coumadin for LVAD as well  H/o SAH Admit in 05/2021 with [facility] requiring ventriculostomy Improved, HAs resolved  CAD No s/s of ischemia.    Continue statin Off ASA with GIB  H/o UGI AVM bleed S/p APC x 4 lesions in 2019 Off ASA and on PPI.   Disposition:   Follow up with EP APP in 6 months   Signed, Graciella Freer, PA-C

## 2023-03-17 NOTE — Patient Instructions (Signed)
Medication Instructions:  Your physician recommends that you continue on your current medications as directed. Please refer to the Current Medication list given to you today.  *If you need a refill on your cardiac medications before your next appointment, please call your pharmacy*  Lab Work: None ordered If you have labs (blood work) drawn today and your tests are completely normal, you will receive your results only by: MyChart Message (if you have MyChart) OR A paper copy in the mail If you have any lab test that is abnormal or we need to change your treatment, we will call you to review the results.  Follow-Up: At [facility], you and your health needs are our priority.  As part of our continuing mission to provide you with exceptional heart care, we have created designated Provider Care Teams.  These Care Teams include your primary Cardiologist (physician) and Advanced Practice Providers (APPs -  Physician Assistants and Nurse Practitioners) who all work together to provide you with the care you need, when you need it.  Your next appointment:   6 month(s)  Provider:   Casimiro Needle "Otilio Saber, PA-C

## 2023-03-20 ENCOUNTER — Ambulatory Visit (HOSPITAL_COMMUNITY): Payer: Self-pay | Admitting: Pharmacist

## 2023-03-20 LAB — POCT INR: INR: 2.2 (ref 2.0–3.0)

## 2023-03-23 ENCOUNTER — Telehealth (HOSPITAL_COMMUNITY): Payer: Self-pay | Admitting: *Deleted

## 2023-03-23 NOTE — Telephone Encounter (Signed)
Received page from pt's wife reporting pt was shocked by his ICD x 2. Pt had just finished showering and getting dressed. Pt was asymptomatic prior to and after shocks. VAD numbers stable. No alarms from VAD noted. Attempted to send remote transmission to device clinic but unable to send. Pt taking Amiodarone 200 mg daily. Has not missed any doses of his medications.   Discussed the above with Dr Gasper Lloyd. Will have pt take Amiodarone 400 mg BID x 2 days and then resume 200 mg daily. Will plan to see pt in VAD clinic on Monday at 10:00 to interrogate device.   Discussed the above with Vikki Ports. Instructed to page VAD coordinator if he is shocked again. If he sustains any further shocks will have pt present to ER for device interrogation and labs. She verbalized understanding off all the above.   Alyce Pagan RN VAD Coordinator  Office: [phone]  24/7 Pager: [phone]

## 2023-03-24 ENCOUNTER — Other Ambulatory Visit (HOSPITAL_COMMUNITY): Payer: Self-pay | Admitting: *Deleted

## 2023-03-24 DIAGNOSIS — Z95811 Presence of heart assist device: Secondary | ICD-10-CM

## 2023-03-24 DIAGNOSIS — Z79899 Other long term (current) drug therapy: Secondary | ICD-10-CM

## 2023-03-24 DIAGNOSIS — I5022 Chronic systolic (congestive) heart failure: Secondary | ICD-10-CM

## 2023-03-24 DIAGNOSIS — Z7901 Long term (current) use of anticoagulants: Secondary | ICD-10-CM

## 2023-03-27 ENCOUNTER — Other Ambulatory Visit: Payer: Self-pay

## 2023-03-27 ENCOUNTER — Encounter (HOSPITAL_COMMUNITY): Payer: Self-pay | Admitting: Cardiology

## 2023-03-27 ENCOUNTER — Telehealth: Payer: Self-pay

## 2023-03-27 ENCOUNTER — Ambulatory Visit (HOSPITAL_COMMUNITY): Payer: Self-pay | Admitting: Pharmacist

## 2023-03-27 ENCOUNTER — Ambulatory Visit (HOSPITAL_COMMUNITY)
Admission: RE | Admit: 2023-03-27 | Discharge: 2023-03-27 | Disposition: A | Discharge status: Home / Self Care | Payer: Medicare HMO | Source: Ambulatory Visit | Attending: Cardiology | Admitting: Cardiology

## 2023-03-27 VITALS — BP 80/0 | HR 77 | Wt 177.4 lb

## 2023-03-27 DIAGNOSIS — Z7901 Long term (current) use of anticoagulants: Secondary | ICD-10-CM | POA: Diagnosis not present

## 2023-03-27 DIAGNOSIS — I130 Hypertensive heart and chronic kidney disease with heart failure and stage 1 through stage 4 chronic kidney disease, or unspecified chronic kidney disease: Secondary | ICD-10-CM | POA: Insufficient documentation

## 2023-03-27 DIAGNOSIS — R0602 Shortness of breath: Secondary | ICD-10-CM | POA: Diagnosis present

## 2023-03-27 DIAGNOSIS — I609 Nontraumatic subarachnoid hemorrhage, unspecified: Secondary | ICD-10-CM

## 2023-03-27 DIAGNOSIS — Z95811 Presence of heart assist device: Secondary | ICD-10-CM | POA: Diagnosis not present

## 2023-03-27 DIAGNOSIS — E785 Hyperlipidemia, unspecified: Secondary | ICD-10-CM | POA: Diagnosis not present

## 2023-03-27 DIAGNOSIS — I5022 Chronic systolic (congestive) heart failure: Secondary | ICD-10-CM | POA: Diagnosis not present

## 2023-03-27 DIAGNOSIS — Z955 Presence of coronary angioplasty implant and graft: Secondary | ICD-10-CM | POA: Insufficient documentation

## 2023-03-27 DIAGNOSIS — I480 Paroxysmal atrial fibrillation: Secondary | ICD-10-CM | POA: Diagnosis not present

## 2023-03-27 DIAGNOSIS — Z8673 Personal history of transient ischemic attack (TIA), and cerebral infarction without residual deficits: Secondary | ICD-10-CM | POA: Insufficient documentation

## 2023-03-27 DIAGNOSIS — Z4509 Encounter for adjustment and management of other cardiac device: Secondary | ICD-10-CM | POA: Diagnosis present

## 2023-03-27 DIAGNOSIS — I472 Ventricular tachycardia, unspecified: Secondary | ICD-10-CM | POA: Diagnosis not present

## 2023-03-27 DIAGNOSIS — Z5941 Food insecurity: Secondary | ICD-10-CM | POA: Insufficient documentation

## 2023-03-27 DIAGNOSIS — Z79899 Other long term (current) drug therapy: Secondary | ICD-10-CM | POA: Diagnosis not present

## 2023-03-27 DIAGNOSIS — I2510 Atherosclerotic heart disease of native coronary artery without angina pectoris: Secondary | ICD-10-CM | POA: Diagnosis not present

## 2023-03-27 DIAGNOSIS — I4892 Unspecified atrial flutter: Secondary | ICD-10-CM | POA: Insufficient documentation

## 2023-03-27 DIAGNOSIS — N1831 Chronic kidney disease, stage 3a: Secondary | ICD-10-CM | POA: Insufficient documentation

## 2023-03-27 DIAGNOSIS — I10 Essential (primary) hypertension: Secondary | ICD-10-CM

## 2023-03-27 LAB — COMPREHENSIVE METABOLIC PANEL
ALT: 17 U/L (ref 0–44)
AST: 22 U/L (ref 15–41)
Albumin: 2.9 g/dL — ABNORMAL LOW (ref 3.5–5.0)
Alkaline Phosphatase: 107 U/L (ref 38–126)
Anion gap: 6 (ref 5–15)
BUN: 18 mg/dL (ref 6–20)
CO2: 23 mmol/L (ref 22–32)
Calcium: 8.9 mg/dL (ref 8.9–10.3)
Chloride: 109 mmol/L (ref 98–111)
Creatinine, Ser: 1.83 mg/dL — ABNORMAL HIGH (ref 0.61–1.24)
GFR, Estimated: 42 mL/min — ABNORMAL LOW (ref >60)
Glucose, Bld: 86 mg/dL (ref 70–99)
Potassium: 4.1 mmol/L (ref 3.5–5.1)
Sodium: 138 mmol/L (ref 135–145)
Total Bilirubin: 0.9 mg/dL (ref <1.2)
Total Protein: 7.8 g/dL (ref 6.5–8.1)

## 2023-03-27 LAB — CBC
HCT: 43.1 % (ref 39.0–52.0)
Hemoglobin: 13.3 g/dL (ref 13.0–17.0)
MCH: 24.4 pg — ABNORMAL LOW (ref 26.0–34.0)
MCHC: 30.9 g/dL (ref 30.0–36.0)
MCV: 78.9 fL — ABNORMAL LOW (ref 80.0–100.0)
Platelets: 223 10*3/uL (ref 150–400)
RBC: 5.46 MIL/uL (ref 4.22–5.81)
RDW: 16.1 % — ABNORMAL HIGH (ref 11.5–15.5)
WBC: 9.8 10*3/uL (ref 4.0–10.5)
nRBC: 0 % (ref 0.0–0.2)

## 2023-03-27 LAB — MAGNESIUM: Magnesium: 1.8 mg/dL (ref 1.7–2.4)

## 2023-03-27 LAB — PREALBUMIN: Prealbumin: 26 mg/dL (ref 18–38)

## 2023-03-27 LAB — LACTATE DEHYDROGENASE: LDH: 210 U/L — ABNORMAL HIGH (ref 98–192)

## 2023-03-27 LAB — T4, FREE: Free T4: 1.45 ng/dL — ABNORMAL HIGH (ref 0.61–1.12)

## 2023-03-27 LAB — TSH: TSH: 3.382 u[IU]/mL (ref 0.350–4.500)

## 2023-03-27 LAB — PROTIME-INR
INR: 1.9 — ABNORMAL HIGH (ref 0.8–1.2)
Prothrombin Time: 21.6 s — ABNORMAL HIGH (ref 11.4–15.2)

## 2023-03-27 NOTE — Patient Instructions (Signed)
Continue Amiodarone 200 mg daily Coumadin dosing per Lauren PharmD Return to clinic for previously scheduled appt

## 2023-03-27 NOTE — Telephone Encounter (Signed)
Alert remote transmission: ICD shocks There was one fast VT arrhythmia detected that failed ATP therapy and was successfully converted after 2 ICD shocks (episode #387)  Sent to triage high priority.

## 2023-03-27 NOTE — Progress Notes (Addendum)
Patient presents for sick visit and 6 year Intermacs with annual maintenance in VAD Clinic today with Vikki Ports) wife. Denies issues with VAD equipment or drive line.   Denies lightheadedness, dizziness, falls, heart failure symptoms, headaches, and signs of bleeding. Reports mild shortness of breath with exertion. He admits he is mostly sedentary, but has been trying to be more active. He rode his stationary bike yesterday.   Received page from pt's wife on Thanksgiving day reporting pt was shocked by his ICD x 2. Pt had just finished showering and getting dressed. Pt was asymptomatic prior to and after shocks. VAD numbers stable. No alarms from VAD noted. Pt taking Amiodarone 200 mg daily. Had not missed any doses of his medications. Discussed the above with Dr Gasper Lloyd and we had pt take Amiodarone 400 mg BID x 2 days and then resume 200 mg daily. Pt increased Amiodarone as instructed. No further shocks sustained.   EKG obtained today and reviewed with Dr Gasper Lloyd. Dr Graciela Husbands in to see pt to discuss above event. Device programming changes made. (Print out placed in pt's shadow chart) Pt and wife want to leave defibrillator therapy settings active at this time. Will continue Amiodarone 200 mg daily. Pt & Vikki Ports instructed to page VAD coordinators if he received additional shocks. They verbalized understanding.   Vital Signs: Doppler Pressure: 80 Automatc BP: 83/67 (74) HR: 77 NSR w/ occasional PVCs SPO2: UTO    Weight: 177.4 lb w/ eqt Last weight: 178.8 lb w/ eqt  VAD Indication: Destination therapy- CKD   VAD interrogation & Equipment Management: Speed: 5400 Flow: 4.9 Power: 4.1 w    PI: 3.4   Alarms: none Events: rare  Fixed speed 5400 Low speed limit: 5100   Primary Controller:  Replace back up battery in 11 months Back up controller:  Replace back up battery in 8 months   Annual Equipment Maintenance on UBC/PM was performed on 03/27/2023.   I reviewed the LVAD parameters  from today and compared the results to the patient's prior recorded data. LVAD interrogation was NEGATIVE for significant power changes, negative for clinical alarms and STABLE for PI events/speed drops. No programming changes were made and pump is functioning within specified parameters. Pt is performing daily controller and system monitor self tests along with completing weekly and monthly maintenance for LVAD equipment.   LVAD equipment check completed and is in good working order. Back up equipment present at this appointment.  Exit Site Care: Drive line is being maintained weekly by Vikki Ports, his wife. Exit site healed and incorporated, the velour is fully implanted at exit site. No redness, drainage, rash, or foul odor noted. Pt denies fever or chills. Pt given 8 weekly dressings for home use.   Device: Medtronic Bi-V Therapies: VF 200 VT 167 Pacing:  VVI 50 Last check: 03/17/23  BP & Labs:  Doppler BP 80  - Doppler is reflecting modified systolic  Hgb 13.3 - No S/S of bleeding. Specifically denies melena/BRBPR or nosebleeds.  LDH stable at 210 with established baseline of 165- 250. Denies tea-colored urine. No power elevations noted on interrogation.     Batteries Manufacture Date: Number of uses: Re-calibration  01/03/20 136, 137 Performed by patient   Annual maintenance completed per Biomed on patient's home MPU and universal Magazine features editor.    Backup system controller 11 volt battery charged during visit.   6 year Intermacs follow up completed including:  Quality of Life, KCCQ-12, and Neurocognitive trail making.   Pt refused 6  minute walk today.   Patient Goals: To survive, and to walk more.   [place] Cardiomyopathy Questionnaire     03/27/2023   11:22 AM 10/19/2022    2:52 PM 05/10/2022    2:30 PM  KCCQ-12  1 a. Ability to shower/bathe Not at all limited Not at all limited Not at all limited  1 b. Ability to walk 1 block Not at all limited Not at all limited Not  at all limited  1 c. Ability to hurry/jog Slightly limited Moderately limited Not at all limited  2. Edema feet/ankles/legs Never over the past 2 weeks 1-2 times a week Never over the past 2 weeks  3. Limited by fatigue Never over the past 2 weeks Never over the past 2 weeks Never over the past 2 weeks  4. Limited by dyspnea Never over the past 2 weeks Less than once a week Never over the past 2 weeks  5. Sitting up / on 3+ pillows Never over the past 2 weeks Never over the past 2 weeks Never over the past 2 weeks  6. Limited enjoyment of life Slightly limited Not limited at all Not limited at all  7. Rest of life w/ symptoms Completely satisfied Mostly satisfied Completely satisfied  8 a. Participation in hobbies Slightly limited Did not limit at all Did not limit at all  8 b. Participation in chores Slightly limited Moderately limited Did not limit at all  8 c. Visiting family/friends Did not limit at all Did not limit at all Did not limit at all      Patient Instructions:  Continue Amiodarone 200 mg daily Coumadin dosing per Lauren PharmD Return to clinic for previously scheduled appt  Alyce Pagan RN VAD Coordinator  Office: [phone]  24/7 Pager: [phone]

## 2023-03-27 NOTE — Progress Notes (Signed)
H&V Care Navigation CSW Progress Note  Clinical Social Worker met with patientand caregiver to assess food insecurity.  Patient is participating in a Managed Medicaid Plan:  NO Patient's wife shared challenges with food insecurity and completed request form for H&V Food pantry. She denies any other concerns at this time. CSW provided bag to help bridge the gap and no other needs at this time. Lasandra Beech, LCSW, CCSW-MCS [phone]   SDOH Screenings   Food Insecurity: Food Insecurity Present (03/27/2023)  Housing: Low Risk  (03/27/2023)  Transportation Needs: No Transportation Needs (03/27/2023)  Utilities: At Risk (07/12/2022)  Depression (PHQ2-9): High Risk (07/19/2021)  Financial Resource Strain: High Risk (07/04/2022)  Physical Activity: Inactive (03/10/2017)  Stress: No Stress Concern Present (03/10/2017)  Tobacco Use: Medium Risk (03/27/2023)

## 2023-03-28 ENCOUNTER — Encounter: Payer: Self-pay | Admitting: Internal Medicine

## 2023-03-28 LAB — T3, FREE: T3, Free: 2.7 pg/mL (ref 2.0–4.4)

## 2023-03-28 NOTE — Progress Notes (Unsigned)
Seen following shock from his ICD without antecedent symptoms  VT at 250-260 msec, failed ATP, failed shock 1 ( actually converted to slower VT) successful with shock 2  Device reprogrammed to VF at 240 msec 2burst and 2 ramp for FVT up to 240 msec

## 2023-03-28 NOTE — Progress Notes (Signed)
LVAD Clinic Follow Up Note HF MD: Dr [name]  HPI: David [name] is a 57 y.o. male with history of ICD (09 PCI to LAD, mRCA, '10 PCI to Lcx and '12 mRCA), HFrEF (EF ~20% for > 5 years), HLD and HTN. S/P LVAD HM-III 04/20/18.    Admitted 04/19/17 - 05/27/2017 for scheduled LVAD implant. On 12/27 he underwent HMIII LVAD implant. Hospital course c/b PNA shock liver and AKI. Required short term HD.  Readmitted 05/30/17 hgb 3.8. INR 2.9.transfused 4u pRBCs. 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 Underwent ventriculostomy. Admission c/b by periods of confusion and urinary retention. Discharged to CIR  Interval hx:  - Mr. Hoopingarner presents today for an acute care visit due to x2 ICD discharges last week. According to him, he was in his usual state of health when he felt x2 ICD shocks in fairly rapid succession. He had mild lightheadedness preceding shocks but otherwise no HFrEF symptoms, volume overload, dizziness or falls. His LVAD flows were normal; no alarms, increase in PI events or low flows. We increased his amiodarone to 400mg  BID.    ital Signs: Doppler Pressure: 80 Automatc BP: 83/67 (74) HR: 77 NSR w/ occasional PVCs SPO2: UTO    Weight: 177.4 lb w/ eqt Last weight: 178.8 lb w/ eqt   VAD Indication: Destination therapy- CKD   VAD interrogation & Equipment Management: Speed: 5400 Flow: 4.9 Power: 4.1 w    PI: 3.4   Alarms: none Events: rare  Fixed speed 5400 Low speed limit: 5100   Primary Controller:  Replace back up battery in 11 months Back up controller:  Replace back up battery in 8 months   Annual Equipment Maintenance on UBC/PM was performed on 03/27/2023.   I reviewed the LVAD parameters from today and compared the results to the patient's prior recorded data. LVAD interrogation was NEGATIVE for significant power  changes, negative for clinical alarms and STABLE for PI events/speed drops. No programming changes were made and pump is functioning within specified parameters. Pt is performing daily controller and system monitor self tests along with completing weekly and monthly maintenance for LVAD equipment.   LVAD equipment check completed and is in good working order. Back up equipment present at this appointment.  Past Medical History:  Diagnosis Date   AICD (automatic cardioverter/defibrillator) present    Anemia    Anxiety    CAD (coronary artery disease) 2009   AMI in 12/2007 with PCI to LAD, staged PCI to  mid/distal RCA, NSTEMI in 02/2009 with BMS to LCx   CHF (congestive heart failure) (HCC)    Chronic kidney disease 11/03/2016   stage 3 kidney disease   Dysrhythmia    "arrythmia problems at some point", "fatal rhythms"   History of blood transfusion    "I was bleeding from was rectum" (04/19/2017)   HLD (hyperlipidemia)    HTN (hypertension)    Hyperlipidemia 03/15/2011   Ischemic cardiomyopathy    Admitted in 07/2010 with CHF exacerbation   Myocardial infarction (HCC)    "I've had 4" (04/19/2017)   Pneumonia 2018 X 1   Seizures (HCC)    one seizure in 04/2016 during cardiac event (04/19/2017)   STEMI 2009 (anterior), 2010 (lateral), and 2012 (inferior) 03/14/2011    Current Outpatient Medications  Medication Sig Dispense Refill   acetaminophen (TYLENOL) 325 MG  tablet Take 2 tablets (650 mg total) by mouth every 4 (four) hours as needed for headache or mild pain.     amiodarone (PACERONE) 200 MG tablet Take 1 tablet by mouth once daily 90 tablet 0   amLODipine (NORVASC) 5 MG tablet Take 1 tablet by mouth once daily 90 tablet 0   atorvastatin (LIPITOR) 40 MG tablet Take 1 tablet by mouth once daily 90 tablet 3   fluticasone (FLONASE) 50 MCG/ACT nasal spray Place 2 sprays into both nostrils daily. 9.9 mL 3   losartan (COZAAR) 50 MG tablet Take 2 tablets by mouth once daily 180 tablet  0   magnesium oxide (MAG-OX) 400 (240 Mg) MG tablet Take 1 tablet (400 mg total) by mouth 2 (two) times daily. 60 tablet 0   Mouthwashes (MOUTH RINSE) LIQD solution 15 mLs by Mouth Rinse route 2 (two) times daily.  0   Multiple Vitamin (MULTIVITAMIN WITH MINERALS) TABS tablet Take 1 tablet by mouth daily. 30 tablet 6   pantoprazole (PROTONIX) 40 MG tablet Take 1 tablet by mouth once daily 90 tablet 3   tamsulosin (FLOMAX) 0.4 MG CAPS capsule Take 1 capsule by mouth once daily 90 capsule 3   warfarin (COUMADIN) 2 MG tablet TAKE 2 MG (1 TABLET) EVERY SUN, MON, WED, AND FRIDAY, AND TAKE 1 MG (1/2 TABLET) ALL OTHER DAYS OR AS DIRECTED BY HF CLINIC 30 tablet 6   Blood Pressure Monitoring (BLOOD PRESSURE CUFF) MISC 1 Device by Does not apply route daily. (Patient not taking: Reported on 05/10/2022) 1 each 0   polyethylene glycol (MIRALAX / GLYCOLAX) 17 g packet Take 17 g by mouth daily. (Patient not taking: Reported on 03/27/2023) 30 each 0   polyvinyl alcohol (LIQUIFILM TEARS) 1.4 % ophthalmic solution Place 2 drops into both eyes as needed for dry eyes. (Patient not taking: Reported on 10/19/2022) 15 mL 0   senna (SENOKOT) 8.6 MG TABS tablet Take 2 tablets (17.2 mg total) by mouth at bedtime. (Patient not taking: Reported on 03/27/2023) 120 tablet 0   topiramate (TOPAMAX) 50 MG tablet Take 1 tablet by mouth twice daily (Patient not taking: Reported on 05/10/2022) 60 tablet 0   traZODone (DESYREL) 50 MG tablet TAKE 1 TABLET BY MOUTH AT BEDTIME (Patient not taking: Reported on 03/27/2023) 90 tablet 0   No current facility-administered medications for this encounter.    Plavix [clopidogrel bisulfate]  Review of systems complete and found to be negative unless listed in HPI.      Vitals:   03/27/23 1036 03/27/23 1037  BP: (!) 83/67 (!) 80/0  Pulse: 77     Wt Readings from Last 3 Encounters:  03/27/23 80.5 kg (177 lb 6.4 oz)  03/17/23 80.7 kg (178 lb)  01/06/23 79.7 kg (175 lb 9.6 oz)    Vital  Signs:  Doppler Pressure: 78 Automatc BP: 100/75 (85) HR: 77 NSR SPO2: UTO    Weight: 175.6 lb w/ eqt Last weight: 178.8 lb w/ eqt    Physical Exam: General:  NAD HEENT: normal  Neck: supple. JVP appears to be low; less than 6cm Cor: normal LVAD hum Lungs: CTA B/L Abdomen: soft, nontender, non-distended. No hepatosplenomegaly. No bruits or masses. Good bowel sounds. Driveline site clean. Anchor in place.  Extremities: no cyanosis, clubbing, rash. Warm with no edema.  Neuro: alert & oriented x 3. No focal deficits.   ASSESSMENT AND PLAN:  1. Chronic systolic CHF with HM III LVAD implant 04/20/17:  - Euvolemic on exam; encouraged  to continue daily exercise.  - Labs WNL; sCr stable/improved to 1.8.  - No alarms on LVAD.   2. Ventricular tachycardia - Device interrogation today with episode of systained VT on Thanksgiving day that failed ATP. After initial shock the VT converted to a slower VT requiring 2nd shock with conversion to sinus rhythm.  - Dr. Graciela Husbands was present during device interrogation in clinic today. After discussion with him we reprogrammed his VF zone to with the hope to avoid unnecessary shocks.  - Continue amiodarone 200mg  daily.  - lab work otherwise unremarkable;   3. PAF/AFL - had DC-CV on 10/17/18 with subsequent short episodes of AF on amio 100 daily - Had recurrent AF in 10/22. Amio increased to 200 bid and converted.  - Remains in NSR - NSR today; EKG reviewed. Continue amiodarone 200mg  daily. Discussed with Dr. Graciela Husbands.   4. SAH - admit in 2/23 with [facility] requiring ventriculostomy - much improved. HAs resolved.  - MAPs stable and at goal.  - No residual deficits on my exam   5. h/o UGI AVM bleed - s/p APC x 4 lesions in 2/19 - N orecurrent bleeding - Continue PPI - Off ASA due to GIB.  - Hgb stable at 13.3 today.   6. CKD Stage IIIa:  - Creatinine baseline 1.3 - 1.7  - Creatinine 1.83 today - Continue to follow - Can consider SLGT2i at  follow up; will hold off in setting of recent VT; appears hypovolemic on exam also.   7. VAD management - VAD interrogated personally. Parameters stable. - LDH 226 - INR goal 1.5-2.0 with h/o SAH Off ASA   - INR  1.9; discussed with pharmD.  - MAPs ok - DL looks good  8. CAD:  s/p PCI RCA/PLOM with DES x2 and DES to mild LAD 1/18 - No s/s angina - Continue statin. Off ASA due to GIB  9. Hypomagnesemia. - Mg 1.8 - Continue Mg supplementation  10. HTN - needs tight BP control with h/o SAH.  - Much improved with addition of amlodipine - MAPs stable today.   11. [employer] fan - unfortunately this has been incurable even with VAD implant  I spent 45 minutes caring for this patient today including face to face time, ordering and reviewing labs, interrogating his device and LVAD, discussing case with Dr. Graciela Husbands, reprogramming his ICD, lengthy discussion with wife regarding her concerns, seeing the patient, documenting in the record, and arranging follow ups.    Mckale Haffey, DO  9:50 AM

## 2023-03-28 NOTE — Addendum Note (Signed)
Encounter addended by: Dorthula Nettles, DO on: 03/28/2023 10:01 AM  Actions taken: Clinical Note Signed, Level of Service modified, Visit diagnoses modified, Charge Capture section accepted

## 2023-03-30 NOTE — Telephone Encounter (Signed)
Seen in CHF clinic and device reprogrammed as outlined in documentation noted

## 2023-04-03 ENCOUNTER — Ambulatory Visit (HOSPITAL_COMMUNITY): Payer: Self-pay | Admitting: Pharmacist

## 2023-04-03 LAB — POCT INR: INR: 2.2 (ref 2.0–3.0)

## 2023-04-10 ENCOUNTER — Ambulatory Visit (HOSPITAL_COMMUNITY): Payer: Self-pay | Admitting: Pharmacist

## 2023-04-10 LAB — POCT INR: INR: 1.9 — AB (ref 2.0–3.0)

## 2023-04-13 ENCOUNTER — Other Ambulatory Visit (HOSPITAL_COMMUNITY): Payer: Self-pay | Admitting: Unknown Physician Specialty

## 2023-04-13 DIAGNOSIS — Z7901 Long term (current) use of anticoagulants: Secondary | ICD-10-CM

## 2023-04-13 DIAGNOSIS — Z95811 Presence of heart assist device: Secondary | ICD-10-CM

## 2023-04-14 ENCOUNTER — Ambulatory Visit (HOSPITAL_COMMUNITY)
Admission: RE | Admit: 2023-04-14 | Discharge: 2023-04-14 | Disposition: A | Discharge status: Home / Self Care | Payer: Medicare HMO | Source: Ambulatory Visit | Attending: Internal Medicine | Admitting: Internal Medicine

## 2023-04-14 VITALS — BP 104/78 | HR 80 | Wt 176.8 lb

## 2023-04-14 DIAGNOSIS — Z8679 Personal history of other diseases of the circulatory system: Secondary | ICD-10-CM | POA: Diagnosis not present

## 2023-04-14 DIAGNOSIS — Z79899 Other long term (current) drug therapy: Secondary | ICD-10-CM | POA: Insufficient documentation

## 2023-04-14 DIAGNOSIS — I472 Ventricular tachycardia, unspecified: Secondary | ICD-10-CM | POA: Diagnosis not present

## 2023-04-14 DIAGNOSIS — I5022 Chronic systolic (congestive) heart failure: Secondary | ICD-10-CM | POA: Insufficient documentation

## 2023-04-14 DIAGNOSIS — F5101 Primary insomnia: Secondary | ICD-10-CM

## 2023-04-14 DIAGNOSIS — G4700 Insomnia, unspecified: Secondary | ICD-10-CM

## 2023-04-14 DIAGNOSIS — Z95811 Presence of heart assist device: Secondary | ICD-10-CM | POA: Diagnosis not present

## 2023-04-14 DIAGNOSIS — N1831 Chronic kidney disease, stage 3a: Secondary | ICD-10-CM | POA: Insufficient documentation

## 2023-04-14 DIAGNOSIS — I10 Essential (primary) hypertension: Secondary | ICD-10-CM | POA: Diagnosis not present

## 2023-04-14 DIAGNOSIS — I480 Paroxysmal atrial fibrillation: Secondary | ICD-10-CM | POA: Diagnosis not present

## 2023-04-14 DIAGNOSIS — I2510 Atherosclerotic heart disease of native coronary artery without angina pectoris: Secondary | ICD-10-CM | POA: Insufficient documentation

## 2023-04-14 DIAGNOSIS — I130 Hypertensive heart and chronic kidney disease with heart failure and stage 1 through stage 4 chronic kidney disease, or unspecified chronic kidney disease: Secondary | ICD-10-CM | POA: Insufficient documentation

## 2023-04-14 DIAGNOSIS — Z95810 Presence of automatic (implantable) cardiac defibrillator: Secondary | ICD-10-CM | POA: Insufficient documentation

## 2023-04-14 DIAGNOSIS — Z955 Presence of coronary angioplasty implant and graft: Secondary | ICD-10-CM | POA: Diagnosis not present

## 2023-04-14 DIAGNOSIS — I4892 Unspecified atrial flutter: Secondary | ICD-10-CM | POA: Diagnosis not present

## 2023-04-14 DIAGNOSIS — E785 Hyperlipidemia, unspecified: Secondary | ICD-10-CM | POA: Insufficient documentation

## 2023-04-14 MED ORDER — AMLODIPINE BESYLATE 5 MG PO TABS: 5.0000 mg | ORAL_TABLET | Freq: Every day | ORAL | 3 refills | Status: DC

## 2023-04-14 MED ORDER — MAGNESIUM OXIDE -MG SUPPLEMENT 400 (240 MG) MG PO TABS: 400.0000 mg | ORAL_TABLET | Freq: Two times a day (BID) | ORAL | 3 refills | Status: DC

## 2023-04-14 MED ORDER — ATORVASTATIN CALCIUM 40 MG PO TABS: 40.0000 mg | ORAL_TABLET | Freq: Every day | ORAL | 3 refills | Status: DC

## 2023-04-14 MED ORDER — WARFARIN SODIUM 2 MG PO TABS: ORAL_TABLET | ORAL | 6 refills | Status: DC

## 2023-04-14 MED ORDER — PANTOPRAZOLE SODIUM 40 MG PO TBEC: 40.0000 mg | DELAYED_RELEASE_TABLET | Freq: Every day | ORAL | 3 refills | Status: AC

## 2023-04-14 MED ORDER — FLUTICASONE PROPIONATE 50 MCG/ACT NA SUSP: 2.0000 | Freq: Every day | NASAL | 3 refills | Status: AC

## 2023-04-14 MED ORDER — AMIODARONE HCL 200 MG PO TABS: 200.0000 mg | ORAL_TABLET | Freq: Every day | ORAL | 3 refills | Status: DC

## 2023-04-14 MED ORDER — LOSARTAN POTASSIUM 50 MG PO TABS
100.0000 mg | ORAL_TABLET | Freq: Every day | ORAL | 3 refills | Status: DC
Start: 2023-04-14 — End: 2023-07-14

## 2023-04-14 MED ORDER — TRAZODONE HCL 50 MG PO TABS: 50.0000 mg | ORAL_TABLET | Freq: Every day | ORAL | 3 refills | Status: DC

## 2023-04-14 MED ORDER — ADULT MULTIVITAMIN W/MINERALS CH: 1.0000 | ORAL_TABLET | Freq: Every day | ORAL | 3 refills | Status: DC

## 2023-04-14 MED ORDER — TAMSULOSIN HCL 0.4 MG PO CAPS: 0.4000 mg | ORAL_CAPSULE | Freq: Every day | ORAL | 3 refills | Status: DC

## 2023-04-14 NOTE — Progress Notes (Addendum)
Patient presents for 3 week f/u in VAD Clinic today with Vikki Ports) wife. Denies issues with VAD equipment or drive line.   Denies lightheadedness, dizziness, falls, heart failure symptoms, headaches, and signs of bleeding. Reports mild shortness of breath with exertion. He admits he is mostly sedentary, but has been trying to be more active. He rode his stationary bike yesterday.   No further shocks since he was shocked x 2 on Thanksgiving. Taking Amiodarone 200 mg daily. Had not missed any doses of his medications. SR on the monitor today.   Dr [name] discussed transplant evaluation with pt and Vikki Ports today. Pt shared with VAD coordinator that he is not ready for referral packet to be sent to [facility]. He would like time to think about this and discuss with his family.   No labs drawn today per Dr [name] as pt has recent labwork on file.   Vital Signs: Doppler Pressure: 86 Automatc BP: 104/78 (87) HR: 80 NSR  SPO2: UTO    Weight: 176.8 lb w/ eqt Last weight: 178.4 lb w/ eqt  VAD Indication: Destination therapy- CKD   VAD interrogation & Equipment Management: Speed: 5400 Flow: 5.2 Power: 4.2 w    PI: 2.7   Alarms: none Events: 0 - 10 daily  Fixed speed 5400 Low speed limit: 5100   Primary Controller:  Replace back up battery in 11 months Back up controller:  Replace back up battery in 8 months   Annual Equipment Maintenance on UBC/PM was performed on 03/27/2023.   I reviewed the LVAD parameters from today and compared the results to the patient's prior recorded data. LVAD interrogation was NEGATIVE for significant power changes, negative for clinical alarms and STABLE for PI events/speed drops. No programming changes were made and pump is functioning within specified parameters. Pt is performing daily controller and system monitor self tests along with completing weekly and monthly maintenance for LVAD equipment.   LVAD equipment check completed and is in good working  order. Back up equipment present at this appointment.  Exit Site Care: Drive line is being maintained weekly by Vikki Ports, his wife. Exit site healed and incorporated, the velour is fully implanted at exit site. No redness, drainage, rash, or foul odor noted. Pt denies fever or chills. Pt given 8 weekly dressings for home use.   Device: Med[employer]onic Bi-V Therapies: VF 200 VT 167 Pacing:  VVI 50 Last check: 03/17/23  BP & Labs:  Doppler BP 86  - Doppler is reflecting MAP  Hgb not drawn today - No S/S of bleeding. Specifically denies melena/BRBPR or nosebleeds.  LDH stable at not drawn today with established baseline of 165- 250. Denies tea-colored urine. No power elevations noted on interrogation.     Patient Instructions:  No medication changes today Coumadin dosing per Lauren PharmD Return to VAD clinic in 2 months. Parking code [id number]  Alyce Pagan RN VAD Coordinator  Office: [phone]  24/7 Pager: [phone]

## 2023-04-14 NOTE — Progress Notes (Signed)
LVAD Clinic Follow Up Note HF MD: Dr [name]  HPI: David [name] is a 57 y.o. male with history of ICD (09 PCI to LAD, mRCA, '10 PCI to Lcx and '12 mRCA), HFrEF (EF ~20% for > 5 years), HLD and HTN. S/P LVAD HM-III 04/20/18.    Admitted 04/19/17 - 05/27/2017 for scheduled LVAD implant. On 12/27 he underwent HMIII LVAD implant. Hospital course c/b PNA shock liver and AKI. Required short term HD.  Readmitted 05/30/17 hgb 3.8. INR 2.9.transfused 4u pRBCs. 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 Underwent ventriculostomy. Admission c/b by periods of confusion and urinary retention. Discharged to CIR  Was seen for acute visit on 03/27/23 for ICD firing x 2 on Thanksgiving.. Electrolytes ok. Seen by Dr. Graciela Husbands and ICD reprogrammed    Vital Signs: Doppler Pressure: 86 Automatc BP: 104/78 (87) HR: 80 NSR  SPO2: UTO    Weight: 176.8 lb w/ eqt Last weight: 178.4 lb w/ eqt   VAD Indication: Destination therapy- CKD   VAD interrogation & Equipment Management: Speed: 5400 Flow: 5.2 Power: 4.2 w    PI: 2.7   Alarms: none Events: 0 - 10 daily  Fixed speed 5400 Low speed limit: 5100   Primary Controller:  Replace back up battery in 11 months Back up controller:  Replace back up battery in 8 months   Annual Equipment Maintenance on UBC/PM was performed on 03/27/2023.   I reviewed the LVAD parameters from today and compared the results to the patient's prior recorded data. LVAD interrogation was NEGATIVE for significant power changes, negative for clinical alarms and STABLE for PI events/speed drops. No programming changes were made and pump is functioning within specified parameters. Pt is performing daily controller and system monitor self tests along with completing weekly and monthly maintenance for LVAD equipment.   LVAD equipment check  completed and is in good working order. Back up equipment present at this appointment.    Past Medical History:  Diagnosis Date   AICD (automatic cardioverter/defibrillator) present    Anemia    Anxiety    CAD (coronary artery disease) 2009   AMI in 12/2007 with PCI to LAD, staged PCI to  mid/distal RCA, NSTEMI in 02/2009 with BMS to LCx   CHF (congestive heart failure) (HCC)    Chronic kidney disease 11/03/2016   stage 3 kidney disease   Dysrhythmia    "arrythmia problems at some point", "fatal rhythms"   History of blood transfusion    "I was bleeding from was rectum" (04/19/2017)   HLD (hyperlipidemia)    HTN (hypertension)    Hyperlipidemia 03/15/2011   Ischemic cardiomyopathy    Admitted in 07/2010 with CHF exacerbation   Myocardial infarction (HCC)    "I've had 4" (04/19/2017)   Pneumonia 2018 X 1   Seizures (HCC)    one seizure in 04/2016 during cardiac event (04/19/2017)   STEMI 2009 (anterior), 2010 (lateral), and 2012 (inferior) 03/14/2011    Current Outpatient Medications  Medication Sig Dispense Refill   acetaminophen (TYLENOL) 325 MG tablet Take 2 tablets (650 mg total) by mouth every 4 (four) hours as needed for headache or mild pain.     amiodarone (PACERONE) 200 MG tablet Take 1 tablet (200 mg total) by mouth daily. 90 tablet 3   amLODipine (NORVASC) 5 MG tablet Take 1 tablet (5  mg total) by mouth daily. 90 tablet 3   atorvastatin (LIPITOR) 40 MG tablet Take 1 tablet (40 mg total) by mouth daily. 90 tablet 3   Blood Pressure Monitoring (BLOOD PRESSURE CUFF) MISC 1 Device by Does not apply route daily. (Patient not taking: Reported on 05/10/2022) 1 each 0   fluticasone (FLONASE) 50 MCG/ACT nasal spray Place 2 sprays into both nostrils daily. 9.9 mL 3   losartan (COZAAR) 50 MG tablet Take 2 tablets (100 mg total) by mouth daily. 180 tablet 3   magnesium oxide (MAG-OX) 400 (240 Mg) MG tablet Take 1 tablet (400 mg total) by mouth 2 (two) times daily. 180 tablet 3    Mouthwashes (MOUTH RINSE) LIQD solution 15 mLs by Mouth Rinse route 2 (two) times daily.  0   Multiple Vitamin (MULTIVITAMIN WITH MINERALS) TABS tablet Take 1 tablet by mouth daily. 90 tablet 3   pantoprazole (PROTONIX) 40 MG tablet Take 1 tablet (40 mg total) by mouth daily. 90 tablet 3   polyethylene glycol (MIRALAX / GLYCOLAX) 17 g packet Take 17 g by mouth daily. (Patient not taking: Reported on 03/27/2023) 30 each 0   polyvinyl alcohol (LIQUIFILM TEARS) 1.4 % ophthalmic solution Place 2 drops into both eyes as needed for dry eyes. (Patient not taking: Reported on 10/19/2022) 15 mL 0   senna (SENOKOT) 8.6 MG TABS tablet Take 2 tablets (17.2 mg total) by mouth at bedtime. (Patient not taking: Reported on 03/27/2023) 120 tablet 0   tamsulosin (FLOMAX) 0.4 MG CAPS capsule Take 1 capsule (0.4 mg total) by mouth daily. 90 capsule 3   topiramate (TOPAMAX) 50 MG tablet Take 1 tablet by mouth twice daily (Patient not taking: Reported on 05/10/2022) 60 tablet 0   traZODone (DESYREL) 50 MG tablet Take 1 tablet (50 mg total) by mouth at bedtime. 90 tablet 3   warfarin (COUMADIN) 2 MG tablet TAKE 2 MG (1 TABLET) EVERY MON, WED, AND FRIDAY, AND TAKE 1 MG (1/2 TABLET) ALL OTHER DAYS OR AS DIRECTED BY HF CLINIC 30 tablet 6   No current facility-administered medications for this encounter.    Plavix [clopidogrel bisulfate]  Review of systems complete and found to be negative unless listed in HPI.      There were no vitals filed for this visit.   Wt Readings from Last 3 Encounters:  03/27/23 80.5 kg (177 lb 6.4 oz)  03/17/23 80.7 kg (178 lb)  01/06/23 79.7 kg (175 lb 9.6 oz)    Vital Signs:  Doppler Pressure: 78 Automatc BP: 100/75 (85) HR: 77 NSR SPO2: UTO    Weight: 175.6 lb w/ eqt Last weight: 178.8 lb w/ eqt    Physical Exam: General:  NAD HEENT: normal  Neck: supple. JVP appears to be low; less than 6cm Cor: normal LVAD hum Lungs: CTA B/L Abdomen: soft, nontender, non-distended. No  hepatosplenomegaly. No bruits or masses. Good bowel sounds. Driveline site clean. Anchor in place.  Extremities: no cyanosis, clubbing, rash. Warm with no edema.  Neuro: alert & oriented x 3. No focal deficits.   ASSESSMENT AND PLAN:  1. Chronic systolic CHF with HM III LVAD implant 04/20/17:  - Euvolemic on exam; encouraged to continue daily exercise.  - Labs WNL; sCr stable/improved to 1.8.  - No alarms on LVAD.   2. Ventricular tachycardia - Device interrogation today with episode of systained VT on Thanksgiving day that failed ATP. After initial shock the VT converted to a slower VT requiring 2nd shock with conversion to  sinus rhythm.  - Dr. Graciela Husbands was present during device interrogation in clinic today. After discussion with him we reprogrammed his VF zone to with the hope to avoid unnecessary shocks.  - Continue amiodarone 200mg  daily.  - lab work otherwise unremarkable;   3. PAF/AFL - had DC-CV on 10/17/18 with subsequent short episodes of AF on amio 100 daily - Had recurrent AF in 10/22. Amio increased to 200 bid and converted.  - Remains in NSR - NSR today; EKG reviewed. Continue amiodarone 200mg  daily. Discussed with Dr. Graciela Husbands.   4. SAH - admit in 2/23 with [facility] requiring ventriculostomy - much improved. HAs resolved.  - MAPs stable and at goal.  - No residual deficits on my exam   5. h/o UGI AVM bleed - s/p APC x 4 lesions in 2/19 - N orecurrent bleeding - Continue PPI - Off ASA due to GIB.  - Hgb stable at 13.3 today.   6. CKD Stage IIIa:  - Creatinine baseline 1.3 - 1.7  - Creatinine 1.83 today - Continue to follow - Can consider SLGT2i at follow up; will hold off in setting of recent VT; appears hypovolemic on exam also.   7. VAD management - VAD interrogated personally. Parameters stable. - LDH 226 - INR goal 1.5-2.0 with h/o SAH Off ASA   - INR  1.9; discussed with pharmD.  - MAPs ok - DL looks good  8. CAD:  s/p PCI RCA/PLOM with DES x2 and DES to  mild LAD 1/18 - No s/s angina - Continue statin. Off ASA due to GIB  9. Hypomagnesemia. - Mg 1.8 - Continue Mg supplementation  10. HTN - needs tight BP control with h/o SAH.  - Much improved with addition of amlodipine - MAPs stable today.   11. [employer] fan - unfortunately this has been incurable even with VAD implant  I spent 45 minutes caring for this patient today including face to face time, ordering and reviewing labs, interrogating his device and LVAD, discussing case with Dr. Graciela Husbands, reprogramming his ICD, lengthy discussion with wife regarding her concerns, seeing the patient, documenting in the record, and arranging follow ups.    Arvilla Meres, MD  11:47 AM

## 2023-04-14 NOTE — Patient Instructions (Addendum)
No medication changes today Coumadin dosing per Lauren PharmD Return to VAD clinic in 2 months. Parking code [phone]

## 2023-04-17 ENCOUNTER — Ambulatory Visit (HOSPITAL_COMMUNITY): Payer: Self-pay | Admitting: Pharmacist

## 2023-04-17 LAB — POCT INR: INR: 1.6 — AB (ref 2.0–3.0)

## 2023-04-24 ENCOUNTER — Ambulatory Visit (HOSPITAL_COMMUNITY): Payer: Self-pay | Admitting: Pharmacist

## 2023-04-24 LAB — POCT INR: INR: 2 (ref 2.0–3.0)

## 2023-04-28 ENCOUNTER — Ambulatory Visit (INDEPENDENT_AMBULATORY_CARE_PROVIDER_SITE_OTHER): Payer: Medicare HMO

## 2023-04-28 DIAGNOSIS — I255 Ischemic cardiomyopathy: Secondary | ICD-10-CM | POA: Diagnosis not present

## 2023-04-28 LAB — CUP PACEART REMOTE DEVICE CHECK
Battery Remaining Longevity: 24 mo
Battery Voltage: 2.92 V
Brady Statistic AP VP Percent: 0 %
Brady Statistic AP VS Percent: 0 %
Brady Statistic AS VP Percent: 0.03 %
Brady Statistic AS VS Percent: 99.97 %
Brady Statistic RA Percent Paced: 0 %
Brady Statistic RV Percent Paced: 0.03 %
Date Time Interrogation Session: 20250103042306
HighPow Impedance: 65 Ohm
Implantable Lead Connection Status: 753985
Implantable Lead Connection Status: 753985
Implantable Lead Connection Status: 753985
Implantable Lead Connection Status: 753985
Implantable Lead Implant Date: 20180409
Implantable Lead Implant Date: 20180409
Implantable Lead Implant Date: 20180716
Implantable Lead Implant Date: 20180716
Implantable Lead Location: 753858
Implantable Lead Location: 753858
Implantable Lead Location: 753859
Implantable Lead Location: 753860
Implantable Lead Model: 5076
Implantable Lead Model: 511212
Implantable Lead Model: 511212
Implantable Lead Serial Number: 263773
Implantable Lead Serial Number: 263775
Implantable Pulse Generator Implant Date: 20180409
Lead Channel Impedance Value: 266 Ohm
Lead Channel Impedance Value: 3325 Ohm
Lead Channel Impedance Value: 342 Ohm
Lead Channel Impedance Value: 342 Ohm
Lead Channel Impedance Value: 4047 Ohm
Lead Channel Impedance Value: 722 Ohm
Lead Channel Pacing Threshold Amplitude: 0.625 V
Lead Channel Pacing Threshold Amplitude: 0.75 V
Lead Channel Pacing Threshold Amplitude: 1.75 V
Lead Channel Pacing Threshold Pulse Width: 0.4 ms
Lead Channel Pacing Threshold Pulse Width: 0.4 ms
Lead Channel Pacing Threshold Pulse Width: 1 ms
Lead Channel Sensing Intrinsic Amplitude: 11.25 mV
Lead Channel Sensing Intrinsic Amplitude: 11.25 mV
Lead Channel Sensing Intrinsic Amplitude: 2.25 mV
Lead Channel Sensing Intrinsic Amplitude: 2.25 mV
Lead Channel Setting Pacing Amplitude: 2.5 V
Lead Channel Setting Pacing Pulse Width: 0.4 ms
Lead Channel Setting Sensing Sensitivity: 0.6 mV
Zone Setting Status: 755011

## 2023-05-01 ENCOUNTER — Ambulatory Visit (HOSPITAL_COMMUNITY): Payer: Self-pay | Admitting: Pharmacist

## 2023-05-01 LAB — POCT INR: INR: 1.8 — AB (ref 2.0–3.0)

## 2023-05-05 ENCOUNTER — Ambulatory Visit: Payer: Medicare HMO | Attending: Cardiology | Admitting: Internal Medicine

## 2023-05-05 ENCOUNTER — Encounter: Payer: Medicare HMO | Admitting: Internal Medicine

## 2023-05-05 ENCOUNTER — Telehealth: Payer: Self-pay | Admitting: Cardiology

## 2023-05-05 ENCOUNTER — Telehealth (HOSPITAL_COMMUNITY): Payer: Self-pay | Admitting: *Deleted

## 2023-05-05 DIAGNOSIS — T82110A Breakdown (mechanical) of cardiac electrode, initial encounter: Secondary | ICD-10-CM | POA: Diagnosis not present

## 2023-05-05 NOTE — Telephone Encounter (Signed)
 Patient's wife, [name], called the answering service this AM with concerns that the patient's device was humming. Patient not having any symptoms, and is not getting shocked by his device. He overall feels well. Overnight, the patient and his wife noticed a humming sound from his device about 4 times. Reported that they already called the LVAD nurse, and they determined that the sound was not related to the LVAD.   I asked patient to send a device transmission this morning, which patient did while we were on the phone. I also asked them to call the device clinic to discuss further. Provided office number. I will also forward this message to the device clinic as an RICK Rollo FABIENE Vicci, PA-C 05/05/2023 8:06 AM

## 2023-05-05 NOTE — Telephone Encounter (Signed)
 Device toning. Oversensing R waves on RV. DC apt made for 930 for programming. Pt agreeable.

## 2023-05-05 NOTE — Progress Notes (Signed)
 Seen as an add on today because of alerts for SIC>> high frequency counters.  Impedance measurements have been normal Pt with 2 prevention ICD and recent therapy 11/24 with VT and shock Rx for fast VT>> without symptoms x shock given presence of LVAD  Recent discussions with [employer] >> can tell where with 6935 lead where the fracture is most likely to occur.  Have reviewed with Dr DB  1) will plan revision of the lead, notwithstanding the presence of the LVAD 2) however the presence of the LVAD offers significant hemodynamic support in the event of recurrent VT, as seen 11/24 Discussed with patient, wife and MIL about the concomitant use of LIFEVEST.  Given the presence of the LVAD we have elected not to use it.  Have reveiweed with Dr RAYNALD And have reached out to Drs CL and GT regarding scheduling revision

## 2023-05-05 NOTE — Telephone Encounter (Signed)
 Received page from pt's wife last night reporting she and pt heard a humming noise and were concerned if it was his VAD or ICD. Denies VAD alarms or feeling poorly. Reports VAD equipment has been functioning as expected. VAD numbers stable. Instructed to check last 6 alarms on controller- no alarms noted. ICD not currently alarming or humming. Advised to page VAD coordinator if humming reoccurred/they have concerns. They verbalized understanding.   Isaiah Knoll RN VAD Coordinator  Office: [phone]  24/7 Pager: [phone]

## 2023-05-08 ENCOUNTER — Ambulatory Visit (HOSPITAL_COMMUNITY): Payer: Self-pay | Admitting: Pharmacist

## 2023-05-08 ENCOUNTER — Telehealth: Payer: Self-pay | Admitting: Internal Medicine

## 2023-05-08 LAB — POCT INR: INR: 2.3 (ref 2.0–3.0)

## 2023-05-08 NOTE — Telephone Encounter (Signed)
 Patient called back to talk with [facility]

## 2023-05-08 NOTE — Telephone Encounter (Signed)
 Spoke with the patient's wife and scheduled him for an appointment with Dr. Lalla Brothers on Wednesday to discuss lead extraction

## 2023-05-09 NOTE — H&P (View-Only) (Signed)
Electrophysiology Office Follow up Visit Note:    Date:  05/10/2023   ID:  David [name], DOB 16-Oct-1965, MRN [id number]  PCP:  Gordan Payment., MD  [facility] Cardiologist:  Arvilla Meres, MD  [facility] Electrophysiologist:  Sherryl Manges, MD    Interval History:     David [name] is a 58 y.o. male who presents for a follow up visit.  The patient last saw Dr. Graciela Husbands May 05, 2023.  That appointment was an add-on because of a device alert for elevated SIC counter indicating impending lead failure.  The patient has a history of ICD therapies.  His medical history is complex and includes an LVAD [facility]) for end-stage cardiomyopathy. The patient follows with Dr. [name].  He has a history of ischemic cardiomyopathy, significant coronary artery disease, hyperlipidemia and hypertension.  He underwent LVAD implant April 20, 2018.  His LVAD course has been complicated by GI bleeding requiring transfusion.  His VAD is destination therapy. The patient is on amiodarone for his history of VT requiring ICD shock.  His ICD lead was implanted August 01, 2016.          Past medical, surgical, social and family history were reviewed.  ROS:   Please see the history of present illness.    All other systems reviewed and are negative.  EKGs/Labs/Other Studies Reviewed:    The following studies were reviewed today:  May 10, 2023 in clinic device interrogation personally reviewed Battery longevity 21 months Leads implanted August 01, 2016 Since May 05, 2023 there were 380 short V-V intervals Most recent VT episode reviewed from March 23, 2023.  Ventricular tachycardia was appropriately detected by the device.  ATP failed.  First shock failed at 36.2 J.  Second shock was successful in converting him back to an a sensed, V sensed rhythm at 36.2 J   June 11, 2021 chest x-ray personally reviewed Dual-chamber, single coil ICD in situ.  LVAD in situ         Physical Exam:    VS:  BP (!) 118/0 (BP Location: Right Arm, Patient Position: Sitting, Cuff Size: Normal)   Pulse 77   Ht 5\' 5"  (1.651 m)   Wt 178 lb (80.7 kg)   SpO2 97%   BMI 29.62 kg/m     Wt Readings from Last 3 Encounters:  05/10/23 178 lb (80.7 kg)  04/14/23 176 lb 12.8 oz (80.2 kg)  03/27/23 177 lb 6.4 oz (80.5 kg)     GEN: no distress CARD: Normal LVAD hum throughout the precordium, ICD pocket well-healed RESP: No IWOB. CTAB.      ASSESSMENT:    1. ICD (implantable cardioverter-defibrillator) lead failure, initial encounter   2. Left ventricular assist device present (HCC)   3. Chronic systolic heart failure (HCC)   4. Ventricular tachycardia (HCC)   5. On amiodarone therapy   6. Malfunction of implantable defibrillator ventricular (ICD) lead    PLAN:    In order of problems listed above:  #Chronic systolic heart failure #LVAD in situ #Ventricular tachycardia #ICD in situ #ICD lead malfunction The patient has end-stage cardiomyopathy with an LVAD in place.  His cardiomyopathy has been complicated by ventricular tachycardia with an ICD in situ with recent appropriate shocks.  Unfortunately his ICD lead is now showing signs of lead failure and will require replacement.  I have discussed the pros and cons of ICD lead extraction and replacement.  We discussed lead abandonment as well during today's clinic appointment.  We discussed the risks of the extraction procedure in detail.  We discussed how his chronic anticoagulation will also increase the risk associated with the procedure.  After our discussion the patient has elected to proceed with lead extraction and replacement.  We will plan to do this on Coumadin with an INR between 1.8 and 2.3.  This will allow [place] to avoid heparin post implant which will reduce the overall hematoma risk and infection risk.  I did discuss this case with Dr. [name] who is his primary heart failure cardiologist.  Risks, benefits,  alternatives to ICD lead extraction and reimplantation were discussed in detail with the patient today. The patient understands that the risks include but are not limited to bleeding, infection, pneumothorax, perforation, tamponade, vascular damage, renal failure, MI, stroke, death, inappropriate shocks, severe/life-threatening bleeding requiring emergent open heart surgery and lead dislodgement and wishes to proceed.  We will therefore schedule device implantation at the next available time.    Signed, Steffanie Dunn, MD, [facility], [facility] 05/10/2023 3:23 PM    Electrophysiology [facility]

## 2023-05-09 NOTE — Progress Notes (Signed)
 Electrophysiology Office Follow up Visit Note:    Date:  05/10/2023   ID:  David [name], DOB 16-Oct-1965, MRN [id number]  PCP:  Gordan Payment., MD  [facility] Cardiologist:  Arvilla Meres, MD  [facility] Electrophysiologist:  Sherryl Manges, MD    Interval History:     David [name] is a 58 y.o. male who presents for a follow up visit.  The patient last saw Dr. Graciela Husbands May 05, 2023.  That appointment was an add-on because of a device alert for elevated SIC counter indicating impending lead failure.  The patient has a history of ICD therapies.  His medical history is complex and includes an LVAD [facility]) for end-stage cardiomyopathy. The patient follows with Dr. [name].  He has a history of ischemic cardiomyopathy, significant coronary artery disease, hyperlipidemia and hypertension.  He underwent LVAD implant April 20, 2018.  His LVAD course has been complicated by GI bleeding requiring transfusion.  His VAD is destination therapy. The patient is on amiodarone for his history of VT requiring ICD shock.  His ICD lead was implanted August 01, 2016.          Past medical, surgical, social and family history were reviewed.  ROS:   Please see the history of present illness.    All other systems reviewed and are negative.  EKGs/Labs/Other Studies Reviewed:    The following studies were reviewed today:  May 10, 2023 in clinic device interrogation personally reviewed Battery longevity 21 months Leads implanted August 01, 2016 Since May 05, 2023 there were 380 short V-V intervals Most recent VT episode reviewed from March 23, 2023.  Ventricular tachycardia was appropriately detected by the device.  ATP failed.  First shock failed at 36.2 J.  Second shock was successful in converting him back to an a sensed, V sensed rhythm at 36.2 J   June 11, 2021 chest x-ray personally reviewed Dual-chamber, single coil ICD in situ.  LVAD in situ         Physical Exam:    VS:  BP (!) 118/0 (BP Location: Right Arm, Patient Position: Sitting, Cuff Size: Normal)   Pulse 77   Ht 5\' 5"  (1.651 m)   Wt 178 lb (80.7 kg)   SpO2 97%   BMI 29.62 kg/m     Wt Readings from Last 3 Encounters:  05/10/23 178 lb (80.7 kg)  04/14/23 176 lb 12.8 oz (80.2 kg)  03/27/23 177 lb 6.4 oz (80.5 kg)     GEN: no distress CARD: Normal LVAD hum throughout the precordium, ICD pocket well-healed RESP: No IWOB. CTAB.      ASSESSMENT:    1. ICD (implantable cardioverter-defibrillator) lead failure, initial encounter   2. Left ventricular assist device present (HCC)   3. Chronic systolic heart failure (HCC)   4. Ventricular tachycardia (HCC)   5. On amiodarone therapy   6. Malfunction of implantable defibrillator ventricular (ICD) lead    PLAN:    In order of problems listed above:  #Chronic systolic heart failure #LVAD in situ #Ventricular tachycardia #ICD in situ #ICD lead malfunction The patient has end-stage cardiomyopathy with an LVAD in place.  His cardiomyopathy has been complicated by ventricular tachycardia with an ICD in situ with recent appropriate shocks.  Unfortunately his ICD lead is now showing signs of lead failure and will require replacement.  I have discussed the pros and cons of ICD lead extraction and replacement.  We discussed lead abandonment as well during today's clinic appointment.  We discussed the risks of the extraction procedure in detail.  We discussed how his chronic anticoagulation will also increase the risk associated with the procedure.  After our discussion the patient has elected to proceed with lead extraction and replacement.  We will plan to do this on Coumadin with an INR between 1.8 and 2.3.  This will allow [place] to avoid heparin post implant which will reduce the overall hematoma risk and infection risk.  I did discuss this case with Dr. [name] who is his primary heart failure cardiologist.  Risks, benefits,  alternatives to ICD lead extraction and reimplantation were discussed in detail with the patient today. The patient understands that the risks include but are not limited to bleeding, infection, pneumothorax, perforation, tamponade, vascular damage, renal failure, MI, stroke, death, inappropriate shocks, severe/life-threatening bleeding requiring emergent open heart surgery and lead dislodgement and wishes to proceed.  We will therefore schedule device implantation at the next available time.    Signed, Steffanie Dunn, MD, [facility], [facility] [facility] 05/10/2023 3:23 PM    Electrophysiology [facility]

## 2023-05-10 ENCOUNTER — Other Ambulatory Visit: Payer: Self-pay

## 2023-05-10 ENCOUNTER — Ambulatory Visit: Payer: Medicare HMO | Attending: Cardiology | Admitting: Cardiology

## 2023-05-10 ENCOUNTER — Encounter: Payer: Self-pay | Admitting: Cardiology

## 2023-05-10 VITALS — BP 118/0 | HR 77 | Ht 65.0 in | Wt 178.0 lb

## 2023-05-10 DIAGNOSIS — I472 Ventricular tachycardia, unspecified: Secondary | ICD-10-CM

## 2023-05-10 DIAGNOSIS — Z95811 Presence of heart assist device: Secondary | ICD-10-CM

## 2023-05-10 DIAGNOSIS — Z79899 Other long term (current) drug therapy: Secondary | ICD-10-CM

## 2023-05-10 DIAGNOSIS — I5022 Chronic systolic (congestive) heart failure: Secondary | ICD-10-CM

## 2023-05-10 DIAGNOSIS — T82110A Breakdown (mechanical) of cardiac electrode, initial encounter: Secondary | ICD-10-CM | POA: Diagnosis not present

## 2023-05-10 DIAGNOSIS — T82198A Other mechanical complication of other cardiac electronic device, initial encounter: Secondary | ICD-10-CM

## 2023-05-10 NOTE — Patient Instructions (Signed)
 Medication Instructions:  Your physician recommends that you continue on your current medications as directed. Please refer to the Current Medication list given to you today.  *If you need a refill on your cardiac medications before your next appointment, please call your pharmacy*   Lab Work: TODAY: BMET and CBC   If you have labs (blood work) drawn today and your tests are completely normal, you will receive your results only by: MyChart Message (if you have MyChart) OR A paper copy in the mail If you have any lab test that is abnormal or we need to change your treatment, we will call you to review the results.   Testing/Procedures: Lead Extraction - we will call you to schedule a date for your procedure  Follow-Up: At [facility], you and your health needs are our priority.  As part of our continuing mission to provide you with exceptional heart care, we have created designated Provider Care Teams.  These Care Teams include your primary Cardiologist (physician) and Advanced Practice Providers (APPs -  Physician Assistants and Nurse Practitioners) who all work together to provide you with the care you need, when you need it.

## 2023-05-11 LAB — CBC WITH DIFFERENTIAL/PLATELET
Basophils Absolute: 0.1 x10E3/uL (ref 0.0–0.2)
Basos: 1 %
EOS (ABSOLUTE): 0.9 x10E3/uL — ABNORMAL HIGH (ref 0.0–0.4)
Eos: 13 %
Hematocrit: 43 % (ref 37.5–51.0)
Hemoglobin: 13.6 g/dL (ref 13.0–17.7)
Immature Grans (Abs): 0 x10E3/uL (ref 0.0–0.1)
Immature Granulocytes: 0 %
Lymphocytes Absolute: 1.3 x10E3/uL (ref 0.7–3.1)
Lymphs: 17 %
MCH: 25.5 pg — ABNORMAL LOW (ref 26.6–33.0)
MCHC: 31.6 g/dL (ref 31.5–35.7)
MCV: 81 fL (ref 79–97)
Monocytes Absolute: 0.8 x10E3/uL (ref 0.1–0.9)
Monocytes: 10 %
Neutrophils Absolute: 4.3 x10E3/uL (ref 1.4–7.0)
Neutrophils: 59 %
Platelets: 226 x10E3/uL (ref 150–450)
RBC: 5.34 x10E6/uL (ref 4.14–5.80)
RDW: 15.2 % (ref 11.6–15.4)
WBC: 7.4 x10E3/uL (ref 3.4–10.8)

## 2023-05-11 LAB — BASIC METABOLIC PANEL
BUN/Creatinine Ratio: 9 (ref 9–20)
BUN: 15 mg/dL (ref 6–24)
CO2: 22 mmol/L (ref 20–29)
Calcium: 8.8 mg/dL (ref 8.7–10.2)
Chloride: 106 mmol/L (ref 96–106)
Creatinine, Ser: 1.73 mg/dL — ABNORMAL HIGH (ref 0.76–1.27)
Glucose: 80 mg/dL (ref 70–99)
Potassium: 4.3 mmol/L (ref 3.5–5.2)
Sodium: 140 mmol/L (ref 134–144)
eGFR: 45 mL/min/{1.73_m2} — ABNORMAL LOW (ref >59)

## 2023-05-12 ENCOUNTER — Telehealth: Payer: Self-pay | Admitting: Cardiology

## 2023-05-12 NOTE — Telephone Encounter (Signed)
Spoke with the patient's wife and advised that we did not have a date for his extraction at this time but we will let them know as soon as possible.

## 2023-05-12 NOTE — Telephone Encounter (Signed)
Patient's wife is requesting to speak with RN Kinnie Feil in regards to when the patient will schedule their lead extraction and replacement. Please advise.

## 2023-05-15 ENCOUNTER — Ambulatory Visit (HOSPITAL_COMMUNITY): Payer: Self-pay | Admitting: Pharmacist

## 2023-05-15 LAB — POCT INR: INR: 1.9 — AB (ref 2.0–3.0)

## 2023-05-15 NOTE — Telephone Encounter (Signed)
Follow Up:     Wife is calling back to see what was decided.

## 2023-05-16 ENCOUNTER — Other Ambulatory Visit: Payer: Self-pay

## 2023-05-16 ENCOUNTER — Telehealth (HOSPITAL_COMMUNITY): Payer: Self-pay | Admitting: *Deleted

## 2023-05-16 NOTE — Progress Notes (Signed)
Patient was called to be informed that the surgery time for tomorrow was changed to 15:00 o'clock. This Clinical research associate spoke with the patient's wife and she was informed that the patient must be at the hospital tomorrow at 12:30 o'clock. Wife verbalized understanding.

## 2023-05-16 NOTE — Telephone Encounter (Signed)
Spoke with pt's wife regarding upcoming lead extraction tomorrow. Per EP team pt to take Coumadin 1 mg as scheduled this evening, and HOLD ALL medications tomorrow. Discussed plan for VAD coordinator to cover case scheduled at 4 PM. All questions answered, and they verbalized understanding to all the above.   Alyce Pagan RN VAD Coordinator  Office: [phone]  24/7 Pager: [phone]

## 2023-05-16 NOTE — Pre-Procedure Instructions (Addendum)
Attempted to call patient regarding procedure instructions for tomorrow. Left voicemail on the following items: Arrival time 1:00 Nothing to eat or drink after midnight No meds AM of procedure Plan to stay overnight. Wash with special soap night before and morning of procedure

## 2023-05-16 NOTE — Progress Notes (Signed)
SDW CALL  Patient was given pre-op instructions over the phone. The opportunity was given for the patient to ask questions. No further questions asked. Patient verbalized understanding of instructions given.   PCP - Dr. Feliciana Rossetti Cardiologist - Dr. [name]  PPM/ICD - ICD - medtronic Device Orders - n/a Rep Notified - [employer] rep aware   LVAD coordinator aware to cover case at 4pm per note by Alyce Pagan, RN  Chest x-ray - denies EKG - 03/27/23 Stress Test -  ECHO - 10/13/18 Cardiac Cath - 2018  Sleep Study - denies  No DM  Last dose of GLP1 agonist-  n/a GLP1 instructions: n/a  Blood Thinner Instructions: patient instructed to take Coumadin on 1/21 but no meds on DOS   ERAS Protcol - NPO    COVID TEST- n/a   Anesthesia review: no  Patient denies shortness of breath, fever, cough and chest pain over the phone call   All instructions explained to the patient, with a verbal understanding of the material. Patient agrees to go over the instructions while at home for a better understanding.    Patient's wife aware of hospital visitor police.  Per Nichola Sizer, 3 visitors will be allowed in the waiting area during the procedure.

## 2023-05-17 ENCOUNTER — Encounter (HOSPITAL_COMMUNITY): Payer: Self-pay | Admitting: Certified Registered"

## 2023-05-17 ENCOUNTER — Inpatient Hospital Stay (HOSPITAL_COMMUNITY): Admission: RE | Admit: 2023-05-17 | Payer: Medicare HMO | Source: Home / Self Care | Admitting: Cardiology

## 2023-05-17 ENCOUNTER — Ambulatory Visit (HOSPITAL_COMMUNITY): Payer: Medicare HMO

## 2023-05-17 DIAGNOSIS — T82110A Breakdown (mechanical) of cardiac electrode, initial encounter: Secondary | ICD-10-CM

## 2023-05-17 DIAGNOSIS — I255 Ischemic cardiomyopathy: Secondary | ICD-10-CM

## 2023-05-17 DIAGNOSIS — I5022 Chronic systolic (congestive) heart failure: Secondary | ICD-10-CM

## 2023-05-17 DIAGNOSIS — Z95810 Presence of automatic (implantable) cardiac defibrillator: Secondary | ICD-10-CM

## 2023-05-17 SURGERY — LEAD EXTRACTION
Anesthesia: General

## 2023-05-17 NOTE — H&P (Incomplete)
Advanced Heart Failure VAD History and Physical Note   PCP-Cardiologist: Arvilla Meres, MD   Reason for Admission: Failed lead exchange  HPI:   David [name] is a 58 y.o. male with history of ICD (09 PCI to LAD, mRCA, '10 PCI to Lcx and '12 mRCA), HFrEF (EF ~20% for > 5 years), HLD and HTN. S/P LVAD HM-III 04/20/18.    Admitted 04/19/17 - 05/27/2017 for scheduled LVAD implant. On 12/27 he underwent HMIII LVAD implant. Hospital course c/b PNA shock liver and AKI. Required short term HD.   Readmitted 05/30/17 hgb 3.8. INR 2.9.transfused 4u pRBCs. EGD - normal esophagus, 4 nonbleeding gastric AVMs treated with APC, normal duodenum and jejunum   Underwent DC-CV for AFL on 10/17/18    Admitted in 2/23 with SAH in setting of HTN. Underwent ventriculostomy. Admission c/b by periods of confusion and urinary retention. Discharged to CIR   Was seen for acute visit on 03/27/23 for ICD firing x 2 on Thanksgiving. Electrolytes ok. Seen by Dr. Graciela Husbands and ICD reprogrammed   Saw Dr. Lalla Brothers in clinic 05/10/23. His ICD lead was showing signs of failure at that time so they planned on lead extraction and replacement. Presented today for procedure. Last coumadin dose yesterday, holding today per EP.    LVAD INTERROGATION:  HeartMate III LVAD:  Flow *** liters/min, speed ***, power ***, PI ***.     Home Medications Prior to Admission medications   Medication Sig Start Date End Date Taking? Authorizing Provider  acetaminophen (TYLENOL) 325 MG tablet Take 2 tablets (650 mg total) by mouth every 4 (four) hours as needed for headache or mild pain. 06/28/21  Yes Andrey Farmer, PA-C  amiodarone (PACERONE) 200 MG tablet Take 1 tablet (200 mg total) by mouth daily. 04/14/23  Yes Bensimhon, Bevelyn Buckles, MD  amLODipine (NORVASC) 5 MG tablet Take 1 tablet (5 mg total) by mouth daily. 04/14/23  Yes Bensimhon, Bevelyn Buckles, MD  atorvastatin (LIPITOR) 40 MG tablet Take 1 tablet (40 mg total) by mouth daily. 04/14/23   Yes Bensimhon, Bevelyn Buckles, MD  fluticasone (FLONASE) 50 MCG/ACT nasal spray Place 2 sprays into both nostrils daily. Patient taking differently: Place 2 sprays into both nostrils daily as needed for allergies or rhinitis. 04/14/23  Yes Bensimhon, Bevelyn Buckles, MD  losartan (COZAAR) 50 MG tablet Take 2 tablets (100 mg total) by mouth daily. Patient taking differently: Take 50 mg by mouth 2 (two) times daily. 04/14/23  Yes Bensimhon, Bevelyn Buckles, MD  magnesium oxide (MAG-OX) 400 (240 Mg) MG tablet Take 1 tablet (400 mg total) by mouth 2 (two) times daily. 04/14/23  Yes Bensimhon, Bevelyn Buckles, MD  Multiple Vitamin (MULTIVITAMIN WITH MINERALS) TABS tablet Take 1 tablet by mouth daily. 04/14/23  Yes Bensimhon, Bevelyn Buckles, MD  pantoprazole (PROTONIX) 40 MG tablet Take 1 tablet (40 mg total) by mouth daily. 04/14/23  Yes Bensimhon, Bevelyn Buckles, MD  polyethylene glycol (MIRALAX / GLYCOLAX) 17 g packet Take 17 g by mouth daily. Patient taking differently: Take 17 g by mouth daily as needed for mild constipation or moderate constipation. 07/06/21  Yes Love, Evlyn Kanner, PA-C  senna (SENOKOT) 8.6 MG TABS tablet Take 2 tablets (17.2 mg total) by mouth at bedtime. Patient taking differently: Take 2 tablets by mouth daily as needed for mild constipation or moderate constipation. 07/06/21  Yes Love, Evlyn Kanner, PA-C  tamsulosin (FLOMAX) 0.4 MG CAPS capsule Take 1 capsule (0.4 mg total) by mouth daily. 04/14/23  Yes Bensimhon, Bevelyn Buckles,  MD  traZODone (DESYREL) 50 MG tablet Take 1 tablet (50 mg total) by mouth at bedtime. 04/14/23  Yes Bensimhon, Bevelyn Buckles, MD  warfarin (COUMADIN) 2 MG tablet TAKE 2 MG (1 TABLET) EVERY MON, WED, AND FRIDAY, AND TAKE 1 MG (1/2 TABLET) ALL OTHER DAYS OR AS DIRECTED BY HF CLINIC 04/14/23  Yes Bensimhon, Bevelyn Buckles, MD  Blood Pressure Monitoring (BLOOD PRESSURE CUFF) MISC 1 Device by Does not apply route daily. Patient not taking: Reported on 05/10/2022 12/13/21   Bensimhon, Bevelyn Buckles, MD    Past Medical  History: Past Medical History:  Diagnosis Date   AICD (automatic cardioverter/defibrillator) present    Anemia    Anxiety    CAD (coronary artery disease) 2009   AMI in 12/2007 with PCI to LAD, staged PCI to  mid/distal RCA, NSTEMI in 02/2009 with BMS to LCx   CHF (congestive heart failure) (HCC)    Chronic kidney disease 11/03/2016   stage 3 kidney disease   Dysrhythmia    "arrythmia problems at some point", "fatal rhythms"   History of blood transfusion    "I was bleeding from was rectum" (04/19/2017)   HLD (hyperlipidemia)    HTN (hypertension)    Hyperlipidemia 03/15/2011   Ischemic cardiomyopathy    Admitted in 07/2010 with CHF exacerbation   Myocardial infarction (HCC)    "I've had 4" (04/19/2017)   Pneumonia 2018 X 1   Seizures (HCC)    one seizure in 04/2016 during cardiac event (04/19/2017)   STEMI 2009 (anterior), 2010 (lateral), and 2012 (inferior) 03/14/2011    Past Surgical History: Past Surgical History:  Procedure Laterality Date   AORTIC VALVE REPAIR N/A 04/20/2017   Procedure: AORTIC VALVE REPAIR;  Surgeon: Alleen Borne, MD;  Location: [facility] OR;  Service: Open Heart Surgery;  Laterality: N/A;   BIV ICD INSERTION CRT-D N/A 08/01/2016   Procedure: BiV ICD Insertion CRT-D;  Surgeon: Will Jorja Loa, MD;  Location: [facility] INVASIVE CV LAB;  Service: Cardiovascular;  Laterality: N/A;   CARDIAC CATHETERIZATION N/A 05/05/2016   Procedure: Right/Left Heart Cath and Coronary Angiography;  Surgeon: Dolores Patty, MD;  Location: [facility] INVASIVE CV LAB;  Service: Cardiovascular;  Laterality: N/A;   CARDIAC CATHETERIZATION N/A 05/09/2016   Procedure: Coronary Stent Intervention;  Surgeon: Peter M Swaziland, MD;  Location: [facility] INVASIVE CV LAB;  Service: Cardiovascular;  Laterality: N/A;   CARDIAC CATHETERIZATION N/A 05/09/2016   Procedure: Intravascular Pressure Wire/FFR Study;  Surgeon: Peter M Swaziland, MD;  Location: [facility] INVASIVE CV LAB;  Service: Cardiovascular;  Laterality: N/A;    CARDIOVERSION N/A 10/17/2018   Procedure: CARDIOVERSION;  Surgeon: Dolores Patty, MD;  Location: [facility] ENDOSCOPY;  Service: Cardiovascular;  Laterality: N/A;   CORONARY ANGIOPLASTY WITH STENT PLACEMENT     "i've got a total of 12 stents" (04/19/2017)   ELECTROPHYSIOLOGY STUDY N/A 06/24/2016   Procedure: Electrophysiology Study;  Surgeon: Duke Salvia, MD;  Location: [facility] INVASIVE CV LAB;  Service: Cardiovascular;  Laterality: N/A;   ENTEROSCOPY N/A 06/03/2017   Procedure: ENTEROSCOPY;  Surgeon: Benancio Deeds, MD;  Location: [facility] ENDOSCOPY;  Service: Gastroenterology;  Laterality: N/A;   EPICARDIAL PACING LEAD PLACEMENT Left 11/07/2016   Procedure: LV EPICARDIAL PACING LEAD PLACEMENT VIA LEFT MINI THORACOTOMY;  Surgeon: Alleen Borne, MD;  Location: [facility] OR;  Service: Thoracic;  Laterality: Left;   ICD IMPLANT N/A 06/24/2016   Procedure: POSSIBLE  ICD Implant;  Surgeon: [facility] Salvia, MD;  Location: [facility] INVASIVE CV LAB;  Service: Cardiovascular;  Laterality: N/A;   INSERTION OF IMPLANTABLE LEFT VENTRICULAR ASSIST DEVICE N/A 04/20/2017   Procedure: INSERTION OF IMPLANTABLE LEFT VENTRICULAR ASSIST DEVICE, Aortic Valve repair;  Surgeon: Alleen Borne, MD;  Location: [facility] OR;  Service: Open Heart Surgery;  Laterality: N/A;  Heartmate 3   IR FLUORO GUIDE CV LINE RIGHT  05/08/2017   IR FLUORO GUIDE CV MIDLINE PICC RIGHT  03/14/2017   IR REMOVAL TUN CV CATH W/O FL  05/25/2017   IR US GUIDE VASC ACCESS RIGHT  03/14/2017   IR US GUIDE VASC ACCESS RIGHT  05/08/2017   LEFT HEART CATHETERIZATION WITH CORONARY ANGIOGRAM N/A 03/13/2011   Procedure: LEFT HEART CATHETERIZATION WITH CORONARY ANGIOGRAM;  Surgeon: Runell Gess, MD;  Location: [facility] CATH LAB;  Service: Cardiovascular;  Laterality: N/A;   MULTIPLE EXTRACTIONS WITH ALVEOLOPLASTY N/A 04/12/2017   Procedure: Extraction of tooth #'s 2, 5-12, 17, 20-29 with alveoloplasty amd maxillary right buccal exostoses reductions.;  Surgeon: Charlynne Pander, DDS;   Location: [facility] OR;  Service: Oral Surgery;  Laterality: N/A;   RIGHT HEART CATH N/A 04/19/2017   Procedure: RIGHT HEART CATH;  Surgeon: Dolores Patty, MD;  Location: [facility] INVASIVE CV LAB;  Service: Cardiovascular;  Laterality: N/A;   RIGHT/LEFT HEART CATH AND CORONARY ANGIOGRAPHY N/A 03/10/2017   Procedure: RIGHT/LEFT HEART CATH AND CORONARY ANGIOGRAPHY;  Surgeon: Dolores Patty, MD;  Location: [facility] INVASIVE CV LAB;  Service: Cardiovascular;  Laterality: N/A;   TEE WITHOUT CARDIOVERSION N/A 04/20/2017   Procedure: TRANSESOPHAGEAL ECHOCARDIOGRAM (TEE);  Surgeon: Alleen Borne, MD;  Location: [facility] OR;  Service: Open Heart Surgery;  Laterality: N/A;   TEE WITHOUT CARDIOVERSION N/A 10/17/2018   Procedure: TRANSESOPHAGEAL ECHOCARDIOGRAM (TEE);  Surgeon: Dolores Patty, MD;  Location: [facility] ENDOSCOPY;  Service: Cardiovascular;  Laterality: N/A;    Family History: Family History  Problem Relation Age of Onset   Coronary artery disease Father    Hypertension Father    Hypertension Sister    Coronary artery disease Brother    Emphysema Brother    Coronary artery disease Mother    Hypertension Mother    Emphysema Mother    Hypertension Daughter    Obesity Daughter    Diabetes Other    Hypertension Other    Coronary artery disease Other     Social History: Social History   Socioeconomic History   Marital status: Married    Spouse name: Vikki Ports   Number of children: 3   Years of education: Not on file   Highest education level: Not on file  Occupational History   Not on file  Tobacco Use   Smoking status: Former    Current packs/day: 0.00    Average packs/day: 0.5 packs/day for 43.0 years (21.5 ttl pk-yrs)    Types: Cigarettes, Cigars    Start date: 04/29/1973    Quit date: 04/29/2016    Years since quitting: 7.0   Smokeless tobacco: Never  Vaping Use   Vaping status: Former   Quit date: 04/29/2016  Substance and Sexual Activity   Alcohol use: No   Drug use: No   Sexual  activity: Yes  Other Topics Concern   Not on file  Social History Narrative   Not on file   Social Drivers of Health   Financial Resource Strain: High Risk (07/04/2022)   Overall Financial Resource Strain (CARDIA)    Difficulty of Paying Living Expenses: Very hard  Food Insecurity: Food Insecurity Present (03/27/2023)   Hunger Vital  Sign    Worried About Programme researcher, broadcasting/film/video in the Last Year: Sometimes true    Ran Out of Food in the Last Year: Sometimes true  Transportation Needs: No Transportation Needs (03/27/2023)   PRAPARE - Administrator, Civil Service (Medical): No    Lack of Transportation (Non-Medical): No  Physical Activity: Inactive (03/10/2017)   Exercise Vital Sign    Days of Exercise per Week: 0 days    Minutes of Exercise per Session: 0 min  Stress: No Stress Concern Present (03/10/2017)   [employer] of Occupational Health - Occupational Stress Questionnaire    Feeling of Stress : Not at all  Social Connections: Not on file    Allergies:  Allergies  Allergen Reactions   Plavix [Clopidogrel Bisulfate] Hives    Objective:    Vital Signs:       There were no vitals filed for this visit.  Mean arterial Pressure ***  Physical Exam    General:  *** appearing.  No respiratory difficulty HEENT: normal Neck: supple. JVD *** cm. Carotids 2+ bilat; no bruits. No lymphadenopathy or thyromegaly appreciated. Cor: PMI nondisplaced. Regular rate & rhythm. No rubs, gallops or murmurs. Lungs: clear Abdomen: soft, nontender, nondistended. No hepatosplenomegaly. No bruits or masses. Good bowel sounds. Extremities: no cyanosis, clubbing, rash, edema  Neuro: alert & oriented x 3, cranial nerves grossly intact. moves all 4 extremities w/o difficulty. Affect pleasant.   Telemetry   ***  EKG   ***  Labs    Basic Metabolic Panel: Recent Labs  Lab 05/10/23 1645  NA 140  K 4.3  CL 106  CO2 22  GLUCOSE 80  BUN 15  CREATININE 1.73*  CALCIUM  8.8    Liver Function Tests: No results for input(s): "AST", "ALT", "ALKPHOS", "BILITOT", "PROT", "ALBUMIN" in the last 168 hours. No results for input(s): "LIPASE", "AMYLASE" in the last 168 hours. No results for input(s): "AMMONIA" in the last 168 hours.  CBC: Recent Labs  Lab 05/10/23 1645  WBC 7.4  NEUTROABS 4.3  HGB 13.6  HCT 43.0  MCV 81  PLT 226    Cardiac Enzymes: No results for input(s): "CKTOTAL", "CKMB", "CKMBINDEX", "TROPONINI" in the last 168 hours.  BNP: BNP (last 3 results) No results for input(s): "BNP" in the last 8760 hours.  ProBNP (last 3 results) No results for input(s): "PROBNP" in the last 8760 hours.   CBG: No results for input(s): "GLUCAP" in the last 168 hours.  Coagulation Studies: Recent Labs    05/15/23 0000  INR 1.9*    Other results: EKG: {ekg findings:315101}.   Imaging    No results found.   Patient Profile:  David [name] is a 58 y.o. male with history of ICD (09 PCI to LAD, mRCA, '10 PCI to Lcx and '12 mRCA), HFrEF (EF ~20% for > 5 years), HLD and HTN. S/P LVAD HM-III 04/20/18.  Assessment/Plan:   Failed lead exchange - presented today for lead exchange - last has coumadin yesterday, holding today per EP - management per EP  Chronic systolic heart failure with HM III LVAD implanted 04/21/27 - Doing well NYHA I with VAD support - volume status ok  - Continued conversations with him and his wife regarding whether or not he will reconsider transplant evaluation after recent VT (he has deferred in past). He and his wife will continue to discuss  3.  Ventricular tachycardia - Had ICD shock x 2 for VT on 03/23/23 (device reprogrammed  at the time) - No recurrence. - Continue amiodarone 200mg  daily.  - Lead exchange as in #1   3. PAF/AFL - had DC-CV on 10/17/18 with subsequent short episodes of AF on amio 100 daily - Had recurrent AF in 10/22. Amio increased to 200 bid and converted.  - In NSR today. *** - Continue  amio. Holding warfarin as seen in #1   4. SAH - admit in 2/23 with [facility] requiring ventriculostomy - much improved. HAs resolved.  - Continue to follow MAPs closely - No significant residual deficits   5. h/o UGI AVM bleed - s/p APC x 4 lesions in 2/19 - No recurrent bleeding - Continue PPI - Off ASA due to GIB.    6. CKD Stage IIIa:  - Creatinine baseline 1.3 - 1.7  - Most recently 1.73 - Continue to follow - Consider SGLT2i if volume status can handle   7. VAD management - VAD interrogated personally. Parameters stable. - Check LDH - INR goal 1.5-2.0 with h/o SAH Off ASA . Goal for procedure 1.8 - INR 1.9 05/15/23. Discussed warfarin dosing with PharmD personally. - MAPs ok, DL ok   8. CAD:  s/p PCI RCA/PLOM with DES x2 and DES to mild LAD 1/18 - No s/s angina - Continue statin. Off ASA due to GIB   9. HTN - needs tight BP control with h/o SAH.  - Much improved with addition of amlodipine - MAPs ok today   10. [employer] fan - unfortunately this has been incurable even with VAD implant - if goes to [facility] for transplant evaluation he is willing to route for [facility]  I reviewed the LVAD parameters from today, and compared the results to the patient's prior recorded data.  No programming changes were made.  The LVAD is functioning within specified parameters.  The patient performs LVAD self-test daily.  LVAD interrogation was negative for any significant power changes, alarms or PI events/speed drops.  LVAD equipment check completed and is in good working order.  Back-up equipment present.   LVAD education done on emergency procedures and precautions and reviewed exit site care.  Length of Stay: 0  Alen Bleacher, NP 05/17/2023, 12:14 PM  VAD Team Pager [phone] (7am - 7am) +++VAD ISSUES ONLY+++   Advanced Heart Failure Team Pager [phone] (M-F; 7a - 5p)  Please contact [facility] for night-coverage after hours (5p -7a ) and weekends on [email or web address] for all non- LVAD  Issues

## 2023-05-17 NOTE — Anesthesia Preprocedure Evaluation (Signed)
Anesthesia Evaluation    Reviewed: Allergy & Precautions, Patient's Chart, lab work & pertinent test results, Unable to perform ROS - Chart review only  Airway        Dental  (+) Edentulous Upper, Edentulous Lower   Pulmonary former smoker          Cardiovascular hypertension, + CAD, + Past MI and +CHF  + dysrhythmias + Cardiac Defibrillator   Echo 09/2018  1. LVAD apical cannula well positioned.   2. The right ventricle has severely reduced systolic function.   3. Left atrial size was moderately dilated.   4. No evidence of a thrombus present in the left atrial appendage.   5. 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   6. The left ventricle had a visually estimated ejection fraction of of  10%. Left ventricular diffuse hypokinesis.      Neuro/Psych Seizures -,   Anxiety        GI/Hepatic   Endo/Other    Renal/GU Renal disease     Musculoskeletal   Abdominal   Peds  Hematology  (+) Blood dyscrasia, anemia   Anesthesia Other Findings   Reproductive/Obstetrics                              Anesthesia Physical Anesthesia Plan  ASA: 4  Anesthesia Plan: General   Post-op Pain Management:    Induction:   PONV Risk Score and Plan: Ondansetron, Propofol infusion, TIVA and Treatment may vary due to age or medical condition  Airway Management Planned: Oral ETT  Additional Equipment: Arterial line and TEE  Intra-op Plan:   Post-operative Plan: Extubation in OR  Informed Consent:   Plan Discussed with:   Anesthesia Plan Comments:          Anesthesia Quick Evaluation

## 2023-05-22 ENCOUNTER — Other Ambulatory Visit: Payer: Self-pay

## 2023-05-22 ENCOUNTER — Encounter (HOSPITAL_COMMUNITY): Payer: Self-pay | Admitting: Cardiology

## 2023-05-22 ENCOUNTER — Ambulatory Visit (HOSPITAL_COMMUNITY): Payer: Self-pay | Admitting: Pharmacist

## 2023-05-22 LAB — POCT INR: INR: 1.8 — AB (ref 2.0–3.0)

## 2023-05-22 NOTE — Progress Notes (Signed)
Patient's wife aware of updated surgical date and time.  Per MD's instructions, patient is not to take any medications the day of surgery and wife is aware.    Per instructions from Dr. Lalla Brothers, patient is to take Warfarin 1 mg on 1/27, Warfarin 1 mg on 1/28, and no medications on the day of surgery.  Patient's wife verbalizes understanding.    Patient's wife has also been made aware of the time change to surgery at 1330 and to arrive at 1100.

## 2023-05-23 NOTE — Anesthesia Preprocedure Evaluation (Signed)
Anesthesia Evaluation  Patient identified by MRN, date of birth, ID band Patient awake    Reviewed: Allergy & Precautions, NPO status , Patient's Chart, lab work & pertinent test results  History of Anesthesia Complications Negative for: history of anesthetic complications  Airway Mallampati: II       Dental  (+) Edentulous Upper, Edentulous Lower   Pulmonary neg shortness of breath, pneumonia, neg recent URI, former smoker Quit smoking 2018, 22 pack year history   breath sounds clear to auscultation       Cardiovascular hypertension, Pt. on medications + CAD, + Past MI, + Cardiac Stents and +CHF  + dysrhythmias + Cardiac Defibrillator  Rhythm:Regular Rate:Normal  Echo 2020 1. LVAD apical cannula well positioned.   2. The right ventricle has severely reduced systolic function.   3. Left atrial size was moderately dilated.   4. No evidence of a thrombus present in the left atrial appendage.   5. 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   6. The left ventricle had a visually estimated ejection fraction of of  10%. Left ventricular diffuse hypokinesis.     Neuro/Psych Seizures -, Well Controlled,  PSYCHIATRIC DISORDERS Anxiety        GI/Hepatic negative GI ROS, Neg liver ROS,neg GERD  ,,(+) neg Cirrhosis        Endo/Other  negative endocrine ROS    Renal/GU CRFRenal diseaseCr 1.73  negative genitourinary   Musculoskeletal negative musculoskeletal ROS (+)    Abdominal   Peds  Hematology  (+) Blood dyscrasia, anemia   Anesthesia Other Findings   Reproductive/Obstetrics negative OB ROS                             Anesthesia Physical Anesthesia Plan  ASA: 4  Anesthesia Plan: General   Post-op Pain Management:    Induction: Intravenous  PONV Risk Score and Plan: 2 and Ondansetron and Dexamethasone  Airway Management Planned: Oral ETT  Additional  Equipment: Arterial line and TEE  Intra-op Plan:   Post-operative Plan: Extubation in OR  Informed Consent: I have reviewed the patients History and Physical, chart, labs and discussed the procedure including the risks, benefits and alternatives for the proposed anesthesia with the patient or authorized representative who has indicated his/her understanding and acceptance.     Dental advisory given  Plan Discussed with: CRNA  Anesthesia Plan Comments: (PAT note written 05/23/2023 by Shonna Chock, PA-C. Has Medtronic ICD. HeartMate 3 LVAD.   )        Anesthesia Quick Evaluation

## 2023-05-23 NOTE — Progress Notes (Signed)
Anesthesia Chart Review:  Case: [id number] Date/Time: 05/24/23 1330   Procedure: LEAD EXTRACTION   Anesthesia type: General   Pre-op diagnosis: lead malfunction   [facility]   Providers: Lanier Prude, MD       DISCUSSION: Patient is a 58 year old male scheduled for the above procedure. He has ischemic cardiomyopathy history if class IV HF, s/p Medtronic ICD 08/01/16 and HeartMate 3 LVAD implantation (for destination therapy) on 04/20/17. Recently with ICD lead malfunction.  History includes former smoker (quit 04/29/16), HTN, HLD, CAD/MI (anterior STEMI s/p DES to mid and proximal LAD 01/20/08, s/p DES mid and distal RCA 01/22/08; BMS CX in the setting of cardiogenic shock 03/06/09; inferior MI s/p BMS mid RCA 03/13/11; PEA arrest with seizure 04/29/16 s/p DES mid RCA and PLOM and DES mid LAD 05/09/16), ischemic cardiomyopathy, chronic systolic CHF (s/p HeartMate 3 LVAD implantation 04/20/17, LVAD course complicated by GI bleeding requiring PRBC 05/2017), dysrhythmia (PAF/flutter with DCCV 10/17/18, frequent PVC's; on amiodarone for VT requiring ICD shock), Medtronic ICD (implanted 08/01/16, unable to get LV lead placed, s/p LV epicardial lead placement vis left mini thoracotomy 11/07/16 for CRT-D), CKD (stage III), anemia (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   On 05/05/23, he was seen by EP Dr. Graciela Husbands as an add-on due to device alert for elevated SIC counter indicating impending lead failure. He had 2 previously shocks, last 02/2023 for VT. He discussed with Medtronic representation and referred patient to Dr. Lalla Brothers. See their notes for additional details.  Above procedure planned. Dr. Lalla Brothers also discussed with Dr. [name]. Procedure was initially planned for 05/17/23, but was rescheduled to 05/24/23 due to weather.   Per instructions from Dr. Lalla Brothers, patient is to take Warfarin 1 mg on 05/22/23 & 05/23/23, and no medications on the day of surgery.  INR was 1.8 on 05/22/23.      Per posting, Dr. Eugenio Hoes is CT surgeon back-up for case. Post-op unit TBA to 2H or 2C. RN to notify LVAD coordinator. ICD is Medtronic. Anesthesia team to evaluate on the day of surgery. Labs on arrival.    VS:  Wt Readings from Last 3 Encounters:  05/10/23 80.7 kg  04/14/23 80.2 kg  03/27/23 80.5 kg   BP Readings from Last 3 Encounters:  05/10/23 (!) 118/0  04/14/23 104/78  03/27/23 (!) 80/0   Pulse Readings from Last 3 Encounters:  05/10/23 77  04/14/23 80  03/27/23 77    PROVIDERS: Gordan Payment., MD is PCP  Arvilla Meres, MD is HF cardiologist Sherryl Manges, MD is EP cardiologist    LABS: For day of surgery. Most recent results in [facility] include: Lab Results  Component Value Date   WBC 7.4 05/10/2023   HGB 13.6 05/10/2023   HCT 43.0 05/10/2023   PLT 226 05/10/2023   GLUCOSE 80 05/10/2023   ALT 17 03/27/2023   AST 22 03/27/2023   NA 140 05/10/2023   K 4.3 05/10/2023   CL 106 05/10/2023   CREATININE 1.73 (H) 05/10/2023   BUN 15 05/10/2023   CO2 22 05/10/2023   TSH 3.382 03/27/2023   INR 1.8 (A) 05/22/2023     EKG: 03/27/23: Sinus rhythm with 1st degree A-V block Right superior axis deviation Non-specific intra-ventricular conduction block significant lead artifact When compared with ECG of 27-Mar-2023 10:47, No significant change since last tracing Confirmed by Zoila Shutter [phone]) on 03/27/2023 7:11:34 PM   CV: TEE 10/17/18: IMPRESSIONS   1. LVAD  apical cannula well positioned.   2. The right ventricle has severely reduced systolic function.   3. Left atrial size was moderately dilated.   4. No evidence of a thrombus present in the left atrial appendage.   5. 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   6. The left ventricle had a visually estimated ejection fraction of of  10%. Left ventricular diffuse hypokinesis.    RHC 04/19/17 (pre-LVAD): Findings: On dobutamine 15mcg/kg/min  RA = 7  RV = 68/9 PA =  64/26 (39) PCW = 19 Fick cardiac output/index = 4.4/2.5 PVR = 4.5 WU Ao sat = 91% PA sat = 57%, 54% PAPi = 5,4 RA/PCWP = 0.37   Assessment: 1. Severe systolic HF with EF 20% 2. Well compensated filling pressures with significant pulmonary HTN   Plan/Discussion: 1. Continue dobutamine 2. Add milrinone 3. Admit for VAD placement tomorrow   RHC/LHC 03/10/17: Dist LAD lesion is 80% stenosed. Ost Cx to Mid Cx lesion is 40% stenosed. Dist RCA lesion is 40% stenosed. Previously placed Prox RCA to Mid RCA stent (unknown type) is widely patent. Mid RCA lesion is 40% stenosed. Previously placed Post Atrio stent (unknown type) is widely patent. Acute Mrg lesion is 99% stenosed. Previously placed Prox LAD to Dist LAD stent (unknown type) is widely patent. Prox LAD lesion is 40% stenosed.  Assessment: 1. Patent LAD and RCA stents. Non obstructive CAD elsehwere 2. Volume overload with low-output physiology Plan/Discussion:  Admit for IV diuresis with milrinone support and start work-up for advanced therapies     Carotid U/S 03/14/17: Final Interpretation: - Right Carotid: There is evidence in the right ICA of a 1-39% stenosis. - Left Carotid: There is evidence in the left ICA of a 1-39% stenosis. The ECA appears >50% stenosed. - Vertebrals:  Both vertebral arteries were patent with antegrade flow.   Past Medical History:  Diagnosis Date   AICD (automatic cardioverter/defibrillator) present    Anemia    Anxiety    CAD (coronary artery disease) 2009   AMI in 12/2007 with PCI to LAD, staged PCI to  mid/distal RCA, NSTEMI in 02/2009 with BMS to LCx   CHF (congestive heart failure) (HCC)    Chronic kidney disease 11/03/2016   stage 3 kidney disease   Dysrhythmia    "arrythmia problems at some point", "fatal rhythms"   History of blood transfusion    "I was bleeding from was rectum" (04/19/2017)   HLD (hyperlipidemia)    HTN (hypertension)    Hyperlipidemia 03/15/2011   Ischemic  cardiomyopathy    Admitted in 07/2010 with CHF exacerbation   Myocardial infarction (HCC)    "I've had 4" (04/19/2017)   Pneumonia 2018 X 1   Seizures (HCC)    one seizure in 04/2016 during cardiac event (04/19/2017)   STEMI 2009 (anterior), 2010 (lateral), and 2012 (inferior) 03/14/2011    Past Surgical History:  Procedure Laterality Date   AORTIC VALVE REPAIR N/A 04/20/2017   Procedure: AORTIC VALVE REPAIR;  Surgeon: Alleen Borne, MD;  [facility];  Service: Open Heart Surgery;  Laterality: N/A;   BIV ICD INSERTION CRT-D N/A 08/01/2016   Procedure: BiV ICD Insertion CRT-D;  Surgeon: Will Jorja Loa, MD;  [facility];  Service: Cardiovascular;  Laterality: N/A;   CARDIAC CATHETERIZATION N/A 05/05/2016   Procedure: Right/Left Heart Cath and Coronary Angiography;  Surgeon: Dolores Patty, MD;  Location: [facility] INVASIVE CV LAB;  Service: Cardiovascular;  Laterality: N/A;   CARDIAC CATHETERIZATION N/A 05/09/2016   Procedure: Coronary Stent Intervention;  Surgeon: Peter M Swaziland, MD;  Location: [facility] INVASIVE CV LAB;  Service: Cardiovascular;  Laterality: N/A;   CARDIAC CATHETERIZATION N/A 05/09/2016   Procedure: Intravascular Pressure Wire/FFR Study;  Surgeon: Peter M Swaziland, MD;  Location: [facility] INVASIVE CV LAB;  Service: Cardiovascular;  Laterality: N/A;   CARDIOVERSION N/A 10/17/2018   Procedure: CARDIOVERSION;  Surgeon: Dolores Patty, MD;  Location: [facility] ENDOSCOPY;  Service: Cardiovascular;  Laterality: N/A;   CORONARY ANGIOPLASTY WITH STENT PLACEMENT     "i've got a total of 12 stents" (04/19/2017)   ELECTROPHYSIOLOGY STUDY N/A 06/24/2016   Procedure: Electrophysiology Study;  Surgeon: Duke Salvia, MD;  Location: [facility] INVASIVE CV LAB;  Service: Cardiovascular;  Laterality: N/A;   ENTEROSCOPY N/A 06/03/2017   Procedure: ENTEROSCOPY;  Surgeon: Benancio Deeds, MD;  Location: [facility] ENDOSCOPY;  Service: Gastroenterology;  Laterality: N/A;   EPICARDIAL PACING LEAD  PLACEMENT Left 11/07/2016   Procedure: LV EPICARDIAL PACING LEAD PLACEMENT VIA LEFT MINI THORACOTOMY;  Surgeon: Alleen Borne, MD;  [facility];  Service: Thoracic;  Laterality: Left;   ICD IMPLANT N/A 06/24/2016   Procedure: POSSIBLE  ICD Implant;  Surgeon: Duke Salvia, MD;  Location: [facility] INVASIVE CV LAB;  Service: Cardiovascular;  Laterality: N/A;   INSERTION OF IMPLANTABLE LEFT VENTRICULAR ASSIST DEVICE N/A 04/20/2017   Procedure: INSERTION OF IMPLANTABLE LEFT VENTRICULAR ASSIST DEVICE, Aortic Valve repair;  Surgeon: Alleen Borne, MD;  [facility];  Service: Open Heart Surgery;  Laterality: N/A;  Heartmate 3   IR FLUORO GUIDE CV LINE RIGHT  05/08/2017   IR FLUORO GUIDE CV MIDLINE PICC RIGHT  03/14/2017   IR REMOVAL TUN CV CATH W/O FL  05/25/2017   IR US GUIDE VASC ACCESS RIGHT  03/14/2017   IR US GUIDE VASC ACCESS RIGHT  05/08/2017   LEFT HEART CATHETERIZATION WITH CORONARY ANGIOGRAM N/A 03/13/2011   Procedure: LEFT HEART CATHETERIZATION WITH CORONARY ANGIOGRAM;  Surgeon: Runell Gess, MD;  Location: [facility] CATH LAB;  Service: Cardiovascular;  Laterality: N/A;   MULTIPLE EXTRACTIONS WITH ALVEOLOPLASTY N/A 04/12/2017   Procedure: Extraction of tooth #'s 2, 5-12, 17, 20-29 with alveoloplasty amd maxillary right buccal exostoses reductions.;  Surgeon: Charlynne Pander, DDS;  [facility];  Service: Oral Surgery;  Laterality: N/A;   RIGHT HEART CATH N/A 04/19/2017   Procedure: RIGHT HEART CATH;  Surgeon: Dolores Patty, MD;  [facility] INV[facility] CV LAB;  Service: Cardiovascular;  Laterality: N/A;   RIGHT/LEFT HEART CATH AND CORONARY ANGIOGRAPHY N/A 03/10/2017   Procedure: RIGHT/LEFT HEART CATH AND CORONARY ANGIOGRAPHY;  Surgeon: Dolores Patty, MD;  [facility] INVAS[facility] CV LAB;  Service: Cardiovascular;  Laterality: N/A;   TEE WITHOUT CARDIOVERSION N/A 04/20/2017   Procedure: TRANSESOPHAGEAL ECHOCARDIOGRAM (TEE);  Surgeon: Alleen Borne, MD;  Location: [facility] OR;  Service:  Open Heart Surgery;  Laterality: N/A;   TEE WITHOUT CARDIOVERSION N/A 10/17/2018   Procedure: TRANSESOPHAGEAL ECHOCARDIOGRAM (TEE);  Surgeon: Dolores Patty, MD;  Location: [facility] ENDOSCOPY;  Service: Cardiovascular;  Laterality: N/A;    MEDICATIONS: No current facility-administered medications for this encounter.    acetaminophen (TYLENOL) 325 MG tablet   amiodarone (PACERONE) 200 MG tablet   amLODipine (NORVASC) 5 MG tablet   atorvastatin (LIPITOR) 40 MG tablet   Blood Pressure Monitoring (BLOOD PRESSURE CUFF) MISC   fluticasone (FLONASE) 50 MCG/ACT nasal spray   losartan (COZAAR) 50 MG tablet  magnesium oxide (MAG-OX) 400 (240 Mg) MG tablet   Multiple Vitamin (MULTIVITAMIN WITH MINERALS) TABS tablet   pantoprazole (PROTONIX) 40 MG tablet   polyethylene glycol (MIRALAX / GLYCOLAX) 17 g packet   senna (SENOKOT) 8.6 MG TABS tablet   tamsulosin (FLOMAX) 0.4 MG CAPS capsule   traZODone (DESYREL) 50 MG tablet   warfarin (COUMADIN) 2 MG tablet    Shonna Chock, PA-C Surgical Short Stay/Anesthesiology [facility] Phone [phone] [facility] Phone [phone] 05/23/2023 11:36 AM

## 2023-05-24 ENCOUNTER — Ambulatory Visit (HOSPITAL_COMMUNITY): Payer: Self-pay | Admitting: Certified Registered Nurse Anesthetist

## 2023-05-24 ENCOUNTER — Other Ambulatory Visit: Payer: Self-pay

## 2023-05-24 ENCOUNTER — Ambulatory Visit (HOSPITAL_BASED_OUTPATIENT_CLINIC_OR_DEPARTMENT_OTHER): Payer: Medicare HMO | Admitting: Certified Registered Nurse Anesthetist

## 2023-05-24 ENCOUNTER — Ambulatory Visit (HOSPITAL_COMMUNITY): Payer: Medicare HMO

## 2023-05-24 ENCOUNTER — Observation Stay (HOSPITAL_COMMUNITY)
Admission: RE | Admit: 2023-05-24 | Discharge: 2023-05-26 | Disposition: A | Discharge status: Home / Self Care | Payer: Medicare HMO | Attending: Internal Medicine | Admitting: Internal Medicine

## 2023-05-24 ENCOUNTER — Ambulatory Visit (HOSPITAL_COMMUNITY)
Admission: RE | Disposition: A | Discharge status: Home / Self Care | Payer: Self-pay | Source: Home / Self Care | Attending: Internal Medicine

## 2023-05-24 ENCOUNTER — Encounter: Payer: Self-pay | Admitting: Internal Medicine

## 2023-05-24 DIAGNOSIS — I509 Heart failure, unspecified: Secondary | ICD-10-CM | POA: Diagnosis not present

## 2023-05-24 DIAGNOSIS — I110 Hypertensive heart disease with heart failure: Secondary | ICD-10-CM | POA: Diagnosis not present

## 2023-05-24 DIAGNOSIS — Z87891 Personal history of nicotine dependence: Secondary | ICD-10-CM | POA: Diagnosis not present

## 2023-05-24 DIAGNOSIS — D6869 Other thrombophilia: Secondary | ICD-10-CM | POA: Diagnosis not present

## 2023-05-24 DIAGNOSIS — I2510 Atherosclerotic heart disease of native coronary artery without angina pectoris: Secondary | ICD-10-CM | POA: Insufficient documentation

## 2023-05-24 DIAGNOSIS — T82118A Breakdown (mechanical) of other cardiac electronic device, initial encounter: Secondary | ICD-10-CM | POA: Insufficient documentation

## 2023-05-24 DIAGNOSIS — E785 Hyperlipidemia, unspecified: Secondary | ICD-10-CM | POA: Diagnosis not present

## 2023-05-24 DIAGNOSIS — T82110A Breakdown (mechanical) of cardiac electrode, initial encounter: Principal | ICD-10-CM | POA: Diagnosis present

## 2023-05-24 DIAGNOSIS — I480 Paroxysmal atrial fibrillation: Secondary | ICD-10-CM | POA: Diagnosis not present

## 2023-05-24 DIAGNOSIS — I472 Ventricular tachycardia, unspecified: Secondary | ICD-10-CM | POA: Insufficient documentation

## 2023-05-24 DIAGNOSIS — Z7901 Long term (current) use of anticoagulants: Secondary | ICD-10-CM | POA: Insufficient documentation

## 2023-05-24 DIAGNOSIS — N1831 Chronic kidney disease, stage 3a: Secondary | ICD-10-CM | POA: Diagnosis not present

## 2023-05-24 DIAGNOSIS — I5022 Chronic systolic (congestive) heart failure: Secondary | ICD-10-CM | POA: Diagnosis not present

## 2023-05-24 DIAGNOSIS — Y831 Surgical operation with implant of artificial internal device as the cause of abnormal reaction of the patient, or of later complication, without mention of misadventure at the time of the procedure: Secondary | ICD-10-CM | POA: Diagnosis not present

## 2023-05-24 DIAGNOSIS — T82198A Other mechanical complication of other cardiac electronic device, initial encounter: Secondary | ICD-10-CM | POA: Diagnosis not present

## 2023-05-24 DIAGNOSIS — Z95811 Presence of heart assist device: Secondary | ICD-10-CM | POA: Insufficient documentation

## 2023-05-24 DIAGNOSIS — I130 Hypertensive heart and chronic kidney disease with heart failure and stage 1 through stage 4 chronic kidney disease, or unspecified chronic kidney disease: Secondary | ICD-10-CM | POA: Insufficient documentation

## 2023-05-24 DIAGNOSIS — Z79899 Other long term (current) drug therapy: Secondary | ICD-10-CM | POA: Diagnosis not present

## 2023-05-24 DIAGNOSIS — Z95810 Presence of automatic (implantable) cardiac defibrillator: Secondary | ICD-10-CM | POA: Diagnosis not present

## 2023-05-24 DIAGNOSIS — Y828 Other medical devices associated with adverse incidents: Secondary | ICD-10-CM | POA: Insufficient documentation

## 2023-05-24 DIAGNOSIS — Z955 Presence of coronary angioplasty implant and graft: Secondary | ICD-10-CM | POA: Insufficient documentation

## 2023-05-24 DIAGNOSIS — Z8679 Personal history of other diseases of the circulatory system: Secondary | ICD-10-CM | POA: Diagnosis not present

## 2023-05-24 DIAGNOSIS — I255 Ischemic cardiomyopathy: Secondary | ICD-10-CM

## 2023-05-24 HISTORY — PX: LEAD EXTRACTION: EP1211

## 2023-05-24 HISTORY — PX: TRANSESOPHAGEAL ECHOCARDIOGRAM (CATH LAB): EP1270

## 2023-05-24 LAB — CBC
HCT: 47.3 % (ref 39.0–52.0)
Hemoglobin: 14.9 g/dL (ref 13.0–17.0)
MCH: 25.1 pg — ABNORMAL LOW (ref 26.0–34.0)
MCHC: 31.5 g/dL (ref 30.0–36.0)
MCV: 79.8 fL — ABNORMAL LOW (ref 80.0–100.0)
Platelets: 242 10*3/uL (ref 150–400)
RBC: 5.93 MIL/uL — ABNORMAL HIGH (ref 4.22–5.81)
RDW: 15.9 % — ABNORMAL HIGH (ref 11.5–15.5)
WBC: 9.2 10*3/uL (ref 4.0–10.5)
nRBC: 0 % (ref 0.0–0.2)

## 2023-05-24 LAB — CBC WITH DIFFERENTIAL/PLATELET
Abs Immature Granulocytes: 0.05 10*3/uL (ref 0.00–0.07)
Basophils Absolute: 0.1 10*3/uL (ref 0.0–0.1)
Basophils Relative: 0 %
Eosinophils Absolute: 0 10*3/uL (ref 0.0–0.5)
Eosinophils Relative: 0 %
HCT: 42.8 % (ref 39.0–52.0)
Hemoglobin: 13.6 g/dL (ref 13.0–17.0)
Immature Granulocytes: 0 %
Lymphocytes Relative: 3 %
Lymphs Abs: 0.3 10*3/uL — ABNORMAL LOW (ref 0.7–4.0)
MCH: 25 pg — ABNORMAL LOW (ref 26.0–34.0)
MCHC: 31.8 g/dL (ref 30.0–36.0)
MCV: 78.8 fL — ABNORMAL LOW (ref 80.0–100.0)
Monocytes Absolute: 0.1 10*3/uL (ref 0.1–1.0)
Monocytes Relative: 1 %
Neutro Abs: 11.8 10*3/uL — ABNORMAL HIGH (ref 1.7–7.7)
Neutrophils Relative %: 96 %
Platelets: 196 10*3/uL (ref 150–400)
RBC: 5.43 MIL/uL (ref 4.22–5.81)
RDW: 15.8 % — ABNORMAL HIGH (ref 11.5–15.5)
WBC: 12.4 10*3/uL — ABNORMAL HIGH (ref 4.0–10.5)
nRBC: 0 % (ref 0.0–0.2)

## 2023-05-24 LAB — COMPREHENSIVE METABOLIC PANEL
ALT: 18 U/L (ref 0–44)
AST: 24 U/L (ref 15–41)
Albumin: 2.9 g/dL — ABNORMAL LOW (ref 3.5–5.0)
Alkaline Phosphatase: 100 U/L (ref 38–126)
Anion gap: 10 (ref 5–15)
BUN: 16 mg/dL (ref 6–20)
CO2: 20 mmol/L — ABNORMAL LOW (ref 22–32)
Calcium: 8.5 mg/dL — ABNORMAL LOW (ref 8.9–10.3)
Chloride: 106 mmol/L (ref 98–111)
Creatinine, Ser: 1.66 mg/dL — ABNORMAL HIGH (ref 0.61–1.24)
GFR, Estimated: 47 mL/min — ABNORMAL LOW (ref >60)
Glucose, Bld: 137 mg/dL — ABNORMAL HIGH (ref 70–99)
Potassium: 4.1 mmol/L (ref 3.5–5.1)
Sodium: 136 mmol/L (ref 135–145)
Total Bilirubin: 0.7 mg/dL (ref 0.0–1.2)
Total Protein: 7.9 g/dL (ref 6.5–8.1)

## 2023-05-24 LAB — SURGICAL PCR SCREEN
MRSA, PCR: NEGATIVE
Staphylococcus aureus: NEGATIVE

## 2023-05-24 LAB — PREPARE RBC (CROSSMATCH)

## 2023-05-24 LAB — BASIC METABOLIC PANEL
Anion gap: 11 (ref 5–15)
BUN: 15 mg/dL (ref 6–20)
CO2: 18 mmol/L — ABNORMAL LOW (ref 22–32)
Calcium: 8.9 mg/dL (ref 8.9–10.3)
Chloride: 107 mmol/L (ref 98–111)
Creatinine, Ser: 1.71 mg/dL — ABNORMAL HIGH (ref 0.61–1.24)
GFR, Estimated: 46 mL/min — ABNORMAL LOW (ref >60)
Glucose, Bld: 90 mg/dL (ref 70–99)
Potassium: 3.8 mmol/L (ref 3.5–5.1)
Sodium: 136 mmol/L (ref 135–145)

## 2023-05-24 LAB — PROTIME-INR
INR: 1.4 — ABNORMAL HIGH (ref 0.8–1.2)
INR: 1.4 — ABNORMAL HIGH (ref 0.8–1.2)
Prothrombin Time: 17.1 s — ABNORMAL HIGH (ref 11.4–15.2)
Prothrombin Time: 17.1 s — ABNORMAL HIGH (ref 11.4–15.2)

## 2023-05-24 LAB — ECHO INTRAOPERATIVE TEE
Height: 65 in
Weight: 2800 [oz_av]

## 2023-05-24 LAB — LACTATE DEHYDROGENASE: LDH: 196 U/L — ABNORMAL HIGH (ref 98–192)

## 2023-05-24 LAB — HIV ANTIBODY (ROUTINE TESTING W REFLEX): HIV Screen 4th Generation wRfx: NONREACTIVE

## 2023-05-24 LAB — MAGNESIUM: Magnesium: 1.7 mg/dL (ref 1.7–2.4)

## 2023-05-24 SURGERY — LEAD EXTRACTION
Anesthesia: General

## 2023-05-24 MED ORDER — AMLODIPINE BESYLATE 5 MG PO TABS
5.0000 mg | ORAL_TABLET | Freq: Every day | ORAL | Status: DC
  Administered 2023-05-25 – 2023-05-26 (×2): 5 mg via ORAL
  Filled 2023-05-24 (×2): qty 1

## 2023-05-24 MED ORDER — LOSARTAN POTASSIUM 50 MG PO TABS
50.0000 mg | ORAL_TABLET | Freq: Two times a day (BID) | ORAL | Status: DC
Start: 2023-05-25 — End: 2023-05-26
  Administered 2023-05-25 – 2023-05-26 (×3): 50 mg via ORAL
  Filled 2023-05-24 (×3): qty 1

## 2023-05-24 MED ORDER — TRAMADOL HCL 50 MG PO TABS
50.0000 mg | ORAL_TABLET | Freq: Four times a day (QID) | ORAL | Status: DC | PRN
  Administered 2023-05-24 – 2023-05-25 (×4): 50 mg via ORAL
  Filled 2023-05-24 (×5): qty 1

## 2023-05-24 MED ORDER — SODIUM CHLORIDE 0.9 % IV SOLN: INTRAVENOUS | Status: DC

## 2023-05-24 MED ORDER — CHLORHEXIDINE GLUCONATE 0.12 % MT SOLN
OROMUCOSAL | Status: AC
  Administered 2023-05-24: 15 mL
  Filled 2023-05-24: qty 15

## 2023-05-24 MED ORDER — CHLORHEXIDINE GLUCONATE 4 % EX SOLN: 4.0000 | Freq: Once | CUTANEOUS | Status: DC

## 2023-05-24 MED ORDER — AMIODARONE HCL 200 MG PO TABS
200.0000 mg | ORAL_TABLET | Freq: Every day | ORAL | Status: DC
  Administered 2023-05-25 – 2023-05-26 (×2): 200 mg via ORAL
  Filled 2023-05-24 (×2): qty 1

## 2023-05-24 MED ORDER — FENTANYL CITRATE (PF) 250 MCG/5ML IJ SOLN
INTRAMUSCULAR | Status: DC | PRN
  Administered 2023-05-24 (×2): 25 ug via INTRAVENOUS
  Administered 2023-05-24: 100 ug via INTRAVENOUS

## 2023-05-24 MED ORDER — FENTANYL CITRATE (PF) 100 MCG/2ML IJ SOLN
INTRAMUSCULAR | Status: AC
  Filled 2023-05-24: qty 2

## 2023-05-24 MED ORDER — ONDANSETRON HCL 4 MG/2ML IJ SOLN: 4.0000 mg | Freq: Four times a day (QID) | INTRAMUSCULAR | Status: DC | PRN

## 2023-05-24 MED ORDER — ONDANSETRON HCL 4 MG/2ML IJ SOLN
INTRAMUSCULAR | Status: DC | PRN
  Administered 2023-05-24: 4 mg via INTRAVENOUS

## 2023-05-24 MED ORDER — MAGNESIUM OXIDE -MG SUPPLEMENT 400 (240 MG) MG PO TABS
400.0000 mg | ORAL_TABLET | Freq: Two times a day (BID) | ORAL | Status: DC
Start: 2023-05-25 — End: 2023-05-26
  Administered 2023-05-25 – 2023-05-26 (×3): 400 mg via ORAL
  Filled 2023-05-24 (×3): qty 1

## 2023-05-24 MED ORDER — LACTATED RINGERS IV SOLN: INTRAVENOUS | Status: DC

## 2023-05-24 MED ORDER — CEFAZOLIN SODIUM-DEXTROSE 2-4 GM/100ML-% IV SOLN
2.0000 g | INTRAVENOUS | Status: AC
  Administered 2023-05-24: 2 g via INTRAVENOUS
  Filled 2023-05-24: qty 100

## 2023-05-24 MED ORDER — ATORVASTATIN CALCIUM 40 MG PO TABS
40.0000 mg | ORAL_TABLET | Freq: Every day | ORAL | Status: DC
  Administered 2023-05-25 – 2023-05-26 (×2): 40 mg via ORAL
  Filled 2023-05-24 (×2): qty 1

## 2023-05-24 MED ORDER — LIDOCAINE 2% (20 MG/ML) 5 ML SYRINGE
INTRAMUSCULAR | Status: DC | PRN
  Administered 2023-05-24: 100 mg via INTRAVENOUS

## 2023-05-24 MED ORDER — PROPOFOL 10 MG/ML IV BOLUS
INTRAVENOUS | Status: DC | PRN
  Administered 2023-05-24: 50 mg via INTRAVENOUS
  Administered 2023-05-24: 30 mg via INTRAVENOUS

## 2023-05-24 MED ORDER — WARFARIN - PHARMACIST DOSING INPATIENT: Freq: Every day | Status: DC

## 2023-05-24 MED ORDER — SODIUM CHLORIDE 0.9 % IV SOLN: 80.0000 mg | INTRAVENOUS | Status: AC

## 2023-05-24 MED ORDER — SODIUM CHLORIDE 0.9% IV SOLUTION: Freq: Once | INTRAVENOUS | Status: DC

## 2023-05-24 MED ORDER — ROCURONIUM BROMIDE 10 MG/ML (PF) SYRINGE
PREFILLED_SYRINGE | INTRAVENOUS | Status: DC | PRN
  Administered 2023-05-24: 100 mg via INTRAVENOUS

## 2023-05-24 MED ORDER — TRAZODONE HCL 50 MG PO TABS
50.0000 mg | ORAL_TABLET | Freq: Every day | ORAL | Status: DC
  Administered 2023-05-24 – 2023-05-25 (×2): 50 mg via ORAL
  Filled 2023-05-24 (×2): qty 1

## 2023-05-24 MED ORDER — POVIDONE-IODINE 10 % EX SWAB
2.0000 | Freq: Once | CUTANEOUS | Status: AC
  Administered 2023-05-24: 2 via TOPICAL

## 2023-05-24 MED ORDER — CEFAZOLIN SODIUM-DEXTROSE 1-4 GM/50ML-% IV SOLN
1.0000 g | Freq: Four times a day (QID) | INTRAVENOUS | Status: AC
  Administered 2023-05-24 – 2023-05-25 (×3): 1 g via INTRAVENOUS
  Filled 2023-05-24 (×3): qty 50

## 2023-05-24 MED ORDER — TAMSULOSIN HCL 0.4 MG PO CAPS
0.4000 mg | ORAL_CAPSULE | Freq: Every day | ORAL | Status: DC
  Administered 2023-05-25 – 2023-05-26 (×2): 0.4 mg via ORAL
  Filled 2023-05-24 (×2): qty 1

## 2023-05-24 MED ORDER — PHENYLEPHRINE HCL-NACL 20-0.9 MG/250ML-% IV SOLN
INTRAVENOUS | Status: DC | PRN
  Administered 2023-05-24: 25 ug/min via INTRAVENOUS

## 2023-05-24 MED ORDER — CEFAZOLIN SODIUM-DEXTROSE 2-4 GM/100ML-% IV SOLN
INTRAVENOUS | Status: AC
  Filled 2023-05-24: qty 100

## 2023-05-24 MED ORDER — OXYCODONE-ACETAMINOPHEN 5-325 MG PO TABS
1.0000 | ORAL_TABLET | Freq: Four times a day (QID) | ORAL | Status: DC | PRN
  Administered 2023-05-24 – 2023-05-26 (×4): 1 via ORAL
  Filled 2023-05-24 (×4): qty 1

## 2023-05-24 MED ORDER — EPHEDRINE SULFATE-NACL 50-0.9 MG/10ML-% IV SOSY
PREFILLED_SYRINGE | INTRAVENOUS | Status: DC | PRN
  Administered 2023-05-24 (×2): 5 mg via INTRAVENOUS

## 2023-05-24 MED ORDER — NOREPINEPHRINE 4 MG/250ML-% IV SOLN
INTRAVENOUS | Status: DC | PRN
  Administered 2023-05-24: 3 ug/min via INTRAVENOUS

## 2023-05-24 MED ORDER — SODIUM CHLORIDE 0.9 % IV SOLN
INTRAVENOUS | Status: AC
  Administered 2023-05-24: 80 mg
  Filled 2023-05-24: qty 2

## 2023-05-24 MED ORDER — WARFARIN SODIUM 2 MG PO TABS
2.0000 mg | ORAL_TABLET | Freq: Once | ORAL | Status: AC
  Administered 2023-05-24: 2 mg via ORAL
  Filled 2023-05-24: qty 1

## 2023-05-24 MED ORDER — DEXAMETHASONE SODIUM PHOSPHATE 10 MG/ML IJ SOLN
INTRAMUSCULAR | Status: DC | PRN
  Administered 2023-05-24: 5 mg via INTRAVENOUS

## 2023-05-24 MED ORDER — PANTOPRAZOLE SODIUM 40 MG PO TBEC
40.0000 mg | DELAYED_RELEASE_TABLET | Freq: Every day | ORAL | Status: DC
  Administered 2023-05-25 – 2023-05-26 (×2): 40 mg via ORAL
  Filled 2023-05-24 (×2): qty 1

## 2023-05-24 MED ORDER — ACETAMINOPHEN 325 MG PO TABS
325.0000 mg | ORAL_TABLET | ORAL | Status: DC | PRN
  Administered 2023-05-25: 650 mg via ORAL

## 2023-05-24 MED ORDER — ACETAMINOPHEN 325 MG PO TABS
650.0000 mg | ORAL_TABLET | ORAL | Status: DC | PRN
Start: 2023-05-24 — End: 2023-05-25
  Filled 2023-05-24: qty 2

## 2023-05-24 MED ORDER — HEPARIN (PORCINE) IN NACL 1000-0.9 UT/500ML-% IV SOLN
INTRAVENOUS | Status: DC | PRN
  Administered 2023-05-24: 500 mL

## 2023-05-24 MED ORDER — SUGAMMADEX SODIUM 200 MG/2ML IV SOLN
INTRAVENOUS | Status: DC | PRN
  Administered 2023-05-24: 100 mg via INTRAVENOUS
  Administered 2023-05-24 (×2): 50 mg via INTRAVENOUS

## 2023-05-24 SURGICAL SUPPLY — 20 items
BALLN OCL BRIDGE 80X90X.9 60 (BALLOONS) ×1 IMPLANT
BALLOON OCL BRIDGE 80X90X.9 60 (BALLOONS) IMPLANT
BRIDGE PREP KIT (KITS) ×1 IMPLANT
CLOSURE PERCLOSE PROSTYLE (VASCULAR PRODUCTS) IMPLANT
DEVICE LCKNG LEAD CARDIAC (CATHETERS) IMPLANT
ICD COBALT XT DR DDPA2D4 (ICD Generator) IMPLANT
KIT BRIDGE PREP (KITS) IMPLANT
KIT LEAD ACCESSORY 6056 PINCH (KITS) IMPLANT
KIT WRENCH (KITS) IMPLANT
LEAD SPRINT QUAT SEC 6935M-62 (Lead) IMPLANT
PAD DEFIB RADIO PHYSIO CONN (PAD) ×1 IMPLANT
POUCH AIGIS-R ANTIBACT ICD (Mesh General) ×1 IMPLANT
POUCH AIGIS-R ANTIBACT ICD LRG (Mesh General) IMPLANT
REMOVAL LLD CARDIAC LEAD EZ (CATHETERS) ×1 IMPLANT
SHEATH 9FR PRELUDE SNAP 25 (SHEATH) IMPLANT
SHEATH LASER 14 FR GLIDELIGHT (SHEATH) IMPLANT
SHEATH PROBE COVER 6X72 (BAG) IMPLANT
SHEATH TIGHTRAIL MECH 13F (SHEATH) IMPLANT
TRAY PACEMAKER INSERTION (PACKS) ×1 IMPLANT
WIRE HI TORQ VERSACORE-J 145CM (WIRE) IMPLANT

## 2023-05-24 NOTE — Anesthesia Procedure Notes (Signed)
Procedure Name: Intubation Date/Time: 05/24/2023 1:32 PM  Performed by: Aundria Rud, CRNAPre-anesthesia Checklist: Patient identified, Emergency Drugs available, Suction available and Patient being monitored Patient Re-evaluated:Patient Re-evaluated prior to induction Oxygen Delivery Method: Circle System Utilized Preoxygenation: Pre-oxygenation with 100% oxygen Induction Type: IV induction Ventilation: Mask ventilation without difficulty Laryngoscope Size: Miller and 2 Grade View: Grade I Tube type: Oral Tube size: 7.5 mm Number of attempts: 1 Airway Equipment and Method: Stylet Placement Confirmation: ETT inserted through vocal cords under direct vision, positive ETCO2 and breath sounds checked- equal and bilateral Secured at: 21 cm Tube secured with: Tape Dental Injury: Teeth and Oropharynx as per pre-operative assessment

## 2023-05-24 NOTE — Interval H&P Note (Signed)
History and Physical Interval Note:  05/24/2023 11:35 AM  David [name]  has presented today for surgery, with the diagnosis of lead malfunction.  The various methods of treatment have been discussed with the patient and family. After consideration of risks, benefits and other options for treatment, the patient has consented to  Procedure(s): LEAD EXTRACTION (N/A) TRANSESOPHAGEAL ECHOCARDIOGRAM (N/A) as a surgical intervention.  The patient's history has been reviewed, patient examined, no change in status, stable for surgery.  I have reviewed the patient's chart and labs.  Questions were answered to the patient's satisfaction.    Goal INR 1.3-1.8  David [name] T Emiliya Chretien

## 2023-05-24 NOTE — Progress Notes (Signed)
VAD Coordinator Procedure Note:   VAD Coordinator met patient in cath lab. Pt undergoing lead extraction per Dr. Lalla Brothers. Hemodynamics and VAD parameters monitored by myself and anesthesia throughout the procedure. Blood pressures were obtained with right radial arterial line.    Time: Doppler Arterial line BP Flow PI Power Speed  Pre-procedure:  1325  65 MAP 4.8 4.4 4.2 5400                    Sedation Induction: 1328  110/93(100) 4.8 4.4 4.1 5400   1345  97/84(90) 5.2 3.1 4.0 5400   1400  67/51(62) 4.1 2.4 3.9 5400   1415  99/87(83) 5.0 3.3 4.1 5400   1430  83/64(75) 5.2 2.4 4.1 5400   1445  89/71(83) 5.1 2.6 4.1 5400   1500  90/72(83) 5.1 2.5 4.1 5400   1515  93/84(88) 5.0 2.7 4.0 5400   1530  97/86(91) 5.0 2.9 4.0 5400  Recovery Area: 1547  99/72(84) 5.0 2.3 4.1 5400   1615  77/65(70) 5.0 2.7 4.0 5400   1630  74/62(68) 5.0 2.7 4.1 5400   Therapies remained as previously set pacing adjusted to DDD 60-130.  Hand off given to Alyce Pagan VAD Coordinator who remained with pt through remainder of recovery.  [name] RN, BSN VAD Coordinator 24/7 Pager [phone]

## 2023-05-24 NOTE — Anesthesia Procedure Notes (Signed)
Arterial Line Insertion Start/End1/29/2025 12:10 PM, 05/24/2023 12:15 PM Performed by: Aundria Rud, CRNA  Patient location: Pre-op. Preanesthetic checklist: patient identified, IV checked, site marked, risks and benefits discussed, surgical consent, monitors and equipment checked, pre-op evaluation, timeout performed and anesthesia consent Lidocaine 1% used for infiltration Right, radial was placed Catheter size: 20 G Hand hygiene performed  and maximum sterile barriers used   Attempts: 1 Procedure performed using ultrasound guided technique. Following insertion, dressing applied and Biopatch. Post procedure assessment: normal and unchanged  Patient tolerated the procedure well with no immediate complications.

## 2023-05-24 NOTE — Plan of Care (Signed)
  Problem: Education: Goal: Patient will understand all VAD equipment and how it functions Outcome: Progressing Goal: Patient will be able to verbalize current INR target range and antiplatelet therapy for discharge home Outcome: Progressing   Problem: Cardiac: Goal: LVAD will function as expected and patient will experience no clinical alarms Outcome: Progressing   Problem: Education: Goal: Knowledge of cardiac device and self-care will improve Outcome: Progressing Goal: Ability to safely manage health related needs after discharge will improve Outcome: Progressing Goal: Individualized Educational Video(s) Outcome: Progressing   Problem: Cardiac: Goal: Ability to achieve and maintain adequate cardiopulmonary perfusion will improve Outcome: Progressing   Problem: Education: Goal: Knowledge of General Education information will improve Description: Including pain rating scale, medication(s)/side effects and non-pharmacologic comfort measures Outcome: Progressing   Problem: Health Behavior/Discharge Planning: Goal: Ability to manage health-related needs will improve Outcome: Progressing   Problem: Clinical Measurements: Goal: Ability to maintain clinical measurements within normal limits will improve Outcome: Progressing Goal: Will remain free from infection Outcome: Progressing Goal: Diagnostic test results will improve Outcome: Progressing Goal: Respiratory complications will improve Outcome: Progressing Goal: Cardiovascular complication will be avoided Outcome: Progressing   Problem: Activity: Goal: Risk for activity intolerance will decrease Outcome: Progressing   Problem: Nutrition: Goal: Adequate nutrition will be maintained Outcome: Progressing   Problem: Coping: Goal: Level of anxiety will decrease Outcome: Progressing   Problem: Elimination: Goal: Will not experience complications related to bowel motility Outcome: Progressing Goal: Will not experience  complications related to urinary retention Outcome: Progressing   Problem: Pain Managment: Goal: General experience of comfort will improve and/or be controlled Outcome: Progressing   Problem: Safety: Goal: Ability to remain free from injury will improve Outcome: Progressing   Problem: Skin Integrity: Goal: Risk for impaired skin integrity will decrease Outcome: Progressing

## 2023-05-24 NOTE — H&P (Signed)
Advanced Heart Failure VAD History and Physical Note   PCP-Cardiologist: Arvilla Meres, MD   Reason for Admission: ICD lead extraction and reimplantation  HPI:   David [name] is a 58 y.o. male with history of ICD (09 PCI to LAD, mRCA, '10 PCI to Lcx and '12 mRCA), HFrEF (EF ~20% for > 5 years), HLD and HTN. S/P LVAD HM-III 04/20/18.    Admitted 04/19/17 - 05/27/2017 for scheduled LVAD implant. On 12/27 he underwent HMIII LVAD implant. Hospital course c/b PNA shock liver and AKI. Required short term HD.   Readmitted 05/30/17 hgb 3.8. INR 2.9.transfused 4u pRBCs. EGD - normal esophagus, 4 nonbleeding gastric AVMs treated with APC, normal duodenum and jejunum   Underwent DC-CV for AFL on 10/17/18    Admitted in 2/23 with SAH in setting of HTN. Underwent ventriculostomy. Admission c/b by periods of confusion and urinary retention. Discharged to CIR   Was seen for acute visit on 03/27/23 for ICD firing x 2 on Thanksgiving. Electrolytes ok. Seen by Dr. Graciela Husbands and ICD reprogrammed    Saw Dr. Lalla Brothers in clinic 05/10/23. His ICD lead was showing signs of failure at that time so planned on lead extraction and replacement. Presented today for procedure. Last dose of Warfarin was on 01/28. Stable post-procedure with MAP of 80.  LVAD INTERROGATION:  HeartMate III LVAD:  Flow 5.0 liters/min, speed 5400, power 4.0, PI 2.8.     Home Medications Prior to Admission medications   Medication Sig Start Date End Date Taking? Authorizing Provider  amiodarone (PACERONE) 200 MG tablet Take 1 tablet (200 mg total) by mouth daily. 04/14/23  Yes Bensimhon, Bevelyn Buckles, MD  amLODipine (NORVASC) 5 MG tablet Take 1 tablet (5 mg total) by mouth daily. 04/14/23  Yes Bensimhon, Bevelyn Buckles, MD  atorvastatin (LIPITOR) 40 MG tablet Take 1 tablet (40 mg total) by mouth daily. 04/14/23  Yes Bensimhon, Bevelyn Buckles, MD  Blood Pressure Monitoring (BLOOD PRESSURE CUFF) MISC 1 Device by Does not apply route daily. 12/13/21  Yes  Bensimhon, Bevelyn Buckles, MD  fluticasone (FLONASE) 50 MCG/ACT nasal spray Place 2 sprays into both nostrils daily. Patient taking differently: Place 2 sprays into both nostrils daily as needed for allergies or rhinitis. 04/14/23  Yes Bensimhon, Bevelyn Buckles, MD  losartan (COZAAR) 50 MG tablet Take 2 tablets (100 mg total) by mouth daily. Patient taking differently: Take 50 mg by mouth 2 (two) times daily. 04/14/23  Yes Bensimhon, Bevelyn Buckles, MD  magnesium oxide (MAG-OX) 400 (240 Mg) MG tablet Take 1 tablet (400 mg total) by mouth 2 (two) times daily. 04/14/23  Yes Bensimhon, Bevelyn Buckles, MD  Multiple Vitamin (MULTIVITAMIN WITH MINERALS) TABS tablet Take 1 tablet by mouth daily. 04/14/23  Yes Bensimhon, Bevelyn Buckles, MD  pantoprazole (PROTONIX) 40 MG tablet Take 1 tablet (40 mg total) by mouth daily. 04/14/23  Yes Bensimhon, Bevelyn Buckles, MD  polyethylene glycol (MIRALAX / GLYCOLAX) 17 g packet Take 17 g by mouth daily. Patient taking differently: Take 17 g by mouth daily as needed for mild constipation or moderate constipation. 07/06/21  Yes Love, Evlyn Kanner, PA-C  senna (SENOKOT) 8.6 MG TABS tablet Take 2 tablets (17.2 mg total) by mouth at bedtime. Patient taking differently: Take 2 tablets by mouth daily as needed for mild constipation or moderate constipation. 07/06/21  Yes Love, Evlyn Kanner, PA-C  tamsulosin (FLOMAX) 0.4 MG CAPS capsule Take 1 capsule (0.4 mg total) by mouth daily. 04/14/23  Yes Bensimhon, Bevelyn Buckles, MD  traZODone (DESYREL)  50 MG tablet Take 1 tablet (50 mg total) by mouth at bedtime. 04/14/23  Yes Bensimhon, Bevelyn Buckles, MD  warfarin (COUMADIN) 2 MG tablet TAKE 2 MG (1 TABLET) EVERY MON, WED, AND FRIDAY, AND TAKE 1 MG (1/2 TABLET) ALL OTHER DAYS OR AS DIRECTED BY HF CLINIC 04/14/23  Yes Bensimhon, Bevelyn Buckles, MD  acetaminophen (TYLENOL) 325 MG tablet Take 2 tablets (650 mg total) by mouth every 4 (four) hours as needed for headache or mild pain. 06/28/21   Andrey Farmer, PA-C    Past Medical  History: Past Medical History:  Diagnosis Date   AICD (automatic cardioverter/defibrillator) present    Anemia    Anxiety    CAD (coronary artery disease) 2009   AMI in 12/2007 with PCI to LAD, staged PCI to  mid/distal RCA, NSTEMI in 02/2009 with BMS to LCx   CHF (congestive heart failure) (HCC)    Chronic kidney disease 11/03/2016   stage 3 kidney disease   Dysrhythmia    "arrythmia problems at some point", "fatal rhythms"   History of blood transfusion    "I was bleeding from was rectum" (04/19/2017)   HLD (hyperlipidemia)    HTN (hypertension)    Hyperlipidemia 03/15/2011   Ischemic cardiomyopathy    Admitted in 07/2010 with CHF exacerbation   Myocardial infarction (HCC)    "I've had 4" (04/19/2017)   Pneumonia 2018 X 1   Seizures (HCC)    one seizure in 04/2016 during cardiac event (04/19/2017)   STEMI 2009 (anterior), 2010 (lateral), and 2012 (inferior) 03/14/2011    Past Surgical History: Past Surgical History:  Procedure Laterality Date   AORTIC VALVE REPAIR N/A 04/20/2017   Procedure: AORTIC VALVE REPAIR;  Surgeon: Alleen Borne, MD;  Location: [facility] OR;  Service: Open Heart Surgery;  Laterality: N/A;   BIV ICD INSERTION CRT-D N/A 08/01/2016   Procedure: BiV ICD Insertion CRT-D;  Surgeon: Will Jorja Loa, MD;  Location: [facility] INVASIVE CV LAB;  Service: Cardiovascular;  Laterality: N/A;   CARDIAC CATHETERIZATION N/A 05/05/2016   Procedure: Right/Left Heart Cath and Coronary Angiography;  Surgeon: Dolores Patty, MD;  Location: [facility] INVASIVE CV LAB;  Service: Cardiovascular;  Laterality: N/A;   CARDIAC CATHETERIZATION N/A 05/09/2016   Procedure: Coronary Stent Intervention;  Surgeon: Peter M Swaziland, MD;  Location: [facility] INVASIVE CV LAB;  Service: Cardiovascular;  Laterality: N/A;   CARDIAC CATHETERIZATION N/A 05/09/2016   Procedure: Intravascular Pressure Wire/FFR Study;  Surgeon: Peter M Swaziland, MD;  Location: [facility] INVASIVE CV LAB;  Service: Cardiovascular;  Laterality: N/A;    CARDIOVERSION N/A 10/17/2018   Procedure: CARDIOVERSION;  Surgeon: Dolores Patty, MD;  Location: [facility] ENDOSCOPY;  Service: Cardiovascular;  Laterality: N/A;   CORONARY ANGIOPLASTY WITH STENT PLACEMENT     "i've got a total of 12 stents" (04/19/2017)   ELECTROPHYSIOLOGY STUDY N/A 06/24/2016   Procedure: Electrophysiology Study;  Surgeon: Duke Salvia, MD;  Location: [facility] INVASIVE CV LAB;  Service: Cardiovascular;  Laterality: N/A;   ENTEROSCOPY N/A 06/03/2017   Procedure: ENTEROSCOPY;  Surgeon: Benancio Deeds, MD;  Location: [facility] ENDOSCOPY;  Service: Gastroenterology;  Laterality: N/A;   EPICARDIAL PACING LEAD PLACEMENT Left 11/07/2016   Procedure: LV EPICARDIAL PACING LEAD PLACEMENT VIA LEFT MINI THORACOTOMY;  Surgeon: Alleen Borne, MD;  Location: [facility] OR;  Service: Thoracic;  Laterality: Left;   ICD IMPLANT N/A 06/24/2016   Procedure: POSSIBLE  ICD Implant;  Surgeon: Duke Salvia, MD;  Location: [facility] INVASIVE CV LAB;  Service:  Cardiovascular;  Laterality: N/A;   INSERTION OF IMPLANTABLE LEFT VENTRICULAR ASSIST DEVICE N/A 04/20/2017   Procedure: INSERTION OF IMPLANTABLE LEFT VENTRICULAR ASSIST DEVICE, Aortic Valve repair;  Surgeon: Alleen Borne, MD;  Location: [facility] OR;  Service: Open Heart Surgery;  Laterality: N/A;  Heartmate 3   IR FLUORO GUIDE CV LINE RIGHT  05/08/2017   IR FLUORO GUIDE CV MIDLINE PICC RIGHT  03/14/2017   IR REMOVAL TUN CV CATH W/O FL  05/25/2017   IR US GUIDE VASC ACCESS RIGHT  03/14/2017   IR US GUIDE VASC ACCESS RIGHT  05/08/2017   LEFT HEART CATHETERIZATION WITH CORONARY ANGIOGRAM N/A 03/13/2011   Procedure: LEFT HEART CATHETERIZATION WITH CORONARY ANGIOGRAM;  Surgeon: Runell Gess, MD;  Location: [facility] CATH LAB;  Service: Cardiovascular;  Laterality: N/A;   MULTIPLE EXTRACTIONS WITH ALVEOLOPLASTY N/A 04/12/2017   Procedure: Extraction of tooth #'s 2, 5-12, 17, 20-29 with alveoloplasty amd maxillary right buccal exostoses reductions.;  Surgeon: Charlynne Pander, DDS;   Location: [facility] OR;  Service: Oral Surgery;  Laterality: N/A;   RIGHT HEART CATH N/A 04/19/2017   Procedure: RIGHT HEART CATH;  Surgeon: Dolores Patty, MD;  Location: [facility] INVASIVE CV LAB;  Service: Cardiovascular;  Laterality: N/A;   RIGHT/LEFT HEART CATH AND CORONARY ANGIOGRAPHY N/A 03/10/2017   Procedure: RIGHT/LEFT HEART CATH AND CORONARY ANGIOGRAPHY;  Surgeon: Dolores Patty, MD;  Location: [facility] INVASIVE CV LAB;  Service: Cardiovascular;  Laterality: N/A;   TEE WITHOUT CARDIOVERSION N/A 04/20/2017   Procedure: TRANSESOPHAGEAL ECHOCARDIOGRAM (TEE);  Surgeon: Alleen Borne, MD;  Location: [facility] OR;  Service: Open Heart Surgery;  Laterality: N/A;   TEE WITHOUT CARDIOVERSION N/A 10/17/2018   Procedure: TRANSESOPHAGEAL ECHOCARDIOGRAM (TEE);  Surgeon: Dolores Patty, MD;  Location: [facility] ENDOSCOPY;  Service: Cardiovascular;  Laterality: N/A;    Family History: Family History  Problem Relation Age of Onset   Coronary artery disease Father    Hypertension Father    Hypertension Sister    Coronary artery disease Brother    Emphysema Brother    Coronary artery disease Mother    Hypertension Mother    Emphysema Mother    Hypertension Daughter    Obesity Daughter    Diabetes Other    Hypertension Other    Coronary artery disease Other     Social History: Social History   Socioeconomic History   Marital status: Married    Spouse name: Vikki Ports   Number of children: 3   Years of education: Not on file   Highest education level: Not on file  Occupational History   Not on file  Tobacco Use   Smoking status: Former    Current packs/day: 0.00    Average packs/day: 0.5 packs/day for 43.0 years (21.5 ttl pk-yrs)    Types: Cigarettes, Cigars    Start date: 04/29/1973    Quit date: 04/29/2016    Years since quitting: 7.0   Smokeless tobacco: Never  Vaping Use   Vaping status: Former   Quit date: 04/29/2016  Substance and Sexual Activity   Alcohol use: No   Drug use: No   Sexual  activity: Yes  Other Topics Concern   Not on file  Social History Narrative   Not on file   Social Drivers of Health   Financial Resource Strain: High Risk (07/04/2022)   Overall Financial Resource Strain (CARDIA)    Difficulty of Paying Living Expenses: Very hard  Food Insecurity: Food Insecurity Present (03/27/2023)   Hunger Vital Sign  Worried About Programme researcher, broadcasting/film/video in the Last Year: Sometimes true    [employer] of Food in the Last Year: Sometimes true  Transportation Needs: No Transportation Needs (03/27/2023)   PRAPARE - Administrator, Civil Service (Medical): No    Lack of Transportation (Non-Medical): No  Physical Activity: Inactive (03/10/2017)   Exercise Vital Sign    Days of Exercise per Week: 0 days    Minutes of Exercise per Session: 0 min  Stress: No Stress Concern Present (03/10/2017)   [employer] of Occupational Health - Occupational Stress Questionnaire    Feeling of Stress : Not at all  Social Connections: Not on file    Allergies:  Allergies  Allergen Reactions   Plavix [Clopidogrel Bisulfate] Hives    Objective:    Vital Signs:   Temp:  [97.8 F (36.6 C)-98.9 F (37.2 C)] 98.9 F (37.2 C) (01/29 1612) Pulse Rate:  [81-93] 84 (01/29 1621) Resp:  [16-18] 18 (01/29 1621) BP: (99-101)/(81-85) 99/81 (01/29 1612) SpO2:  [89 %-93 %] 93 % (01/29 1621) Arterial Line BP: (76)/(65-67) 76/65 (01/29 1621) Weight:  [79.4 kg] 79.4 kg (01/29 1133)   Filed Weights   05/24/23 1133  Weight: 79.4 kg    Mean arterial Pressure 80  Physical Exam    General:  Fatigued appearing, sedation wearing off. HEENT: Normal Neck: supple. JVP not elevated Cor: Mechanical heart sounds with LVAD hum present. Compression dressing L upper chest Lungs: Clear Abdomen: soft, nontender, nondistended.  Driveline: C/D/I; securement device intact and driveline incorporated Extremities: no cyanosis, clubbing, rash, edema, R radial Aline, dressing at R femoral  groin site Neuro: alert & orientedx3. Affect pleasant   Labs    Basic Metabolic Panel: Recent Labs  Lab 05/24/23 1121  NA 136  K 3.8  CL 107  CO2 18*  GLUCOSE 90  BUN 15  CREATININE 1.71*  CALCIUM 8.9    Liver Function Tests: No results for input(s): "AST", "ALT", "ALKPHOS", "BILITOT", "PROT", "ALBUMIN" in the last 168 hours. No results for input(s): "LIPASE", "AMYLASE" in the last 168 hours. No results for input(s): "AMMONIA" in the last 168 hours.  CBC: Recent Labs  Lab 05/24/23 1121  WBC 9.2  HGB 14.9  HCT 47.3  MCV 79.8*  PLT 242    Cardiac Enzymes: No results for input(s): "CKTOTAL", "CKMB", "CKMBINDEX", "TROPONINI" in the last 168 hours.  BNP: BNP (last 3 results) No results for input(s): "BNP" in the last 8760 hours.  ProBNP (last 3 results) No results for input(s): "PROBNP" in the last 8760 hours.   CBG: No results for input(s): "GLUCAP" in the last 168 hours.  Coagulation Studies: Recent Labs    05/22/23 0000 05/24/23 1121  LABPROT  --  17.1*  INR 1.8* 1.4*     Imaging    No results found.    Patient Profile:   David [name] is a 58 y.o. male with history of CAD (09 PCI to LAD, mRCA, '10 PCI to Lcx and '12 mRCA), HFrEF (EF ~20% for > 5 years)/ICM s/p HM III LVAD 12/19, VT with prior ICD shocks, HLD and HTN.   Admitted for ICD lead extraction and reimplantation d/t lead failure.  Assessment/Plan:    Failed lead exchange - presented today for lead exchange. Procedure without complication.  - last had coumadin yesterday, holding tonight per EP   Chronic systolic heart failure with HM III LVAD implanted 04/21/27 - Doing well NYHA I with VAD support - volume  status ok - Restart home medications tomorrow. - Continued conversations with him and his wife regarding whether or not he will reconsider transplant evaluation after recent VT (he has deferred in past).   3.  Ventricular tachycardia - Had ICD shock x 2 for VT on 03/23/23  (device reprogrammed at the time) - No recurrence. - Continue amiodarone 200mg  daily.  - Lead exchange as in #1   3. PAF/AFL - had DC-CV on 10/17/18 with subsequent short episodes of AF on amio 100 daily - Had recurrent AF in 10/22. Amio increased to 200 bid and converted.  - Continue amio. Holding warfarin as seen in #1   4. SAH - admit in 2/23 with [facility] requiring ventriculostomy - much improved. HAs resolved.  - Continue to follow MAPs closely - No significant residual deficits   5. h/o UGI AVM bleed - s/p APC x 4 lesions in 2/19 - No recurrent bleeding - Continue PPI - Off ASA due to GIB.    6. CKD Stage IIIa:  - Creatinine baseline 1.3 - 1.7, stable 1.71 today - Continue to follow   7. VAD management - VAD interrogated personally. Parameters stable. - Check LDH - INR goal 1.5-2.0 with h/o SAH. Off ASA  - INR 1.4 today. Timing of restarting anticoagulation per EP. - MAPs ok, DL ok   8. CAD:  s/p PCI RCA/PLOM with DES x2 and DES to mild LAD 1/18 - No s/s angina - Continue statin. Off ASA due to GIB.   9. HTN - needs tight BP control with h/o SAH.  - MAPs ok today   10. [employer] fan - unfortunately this has been incurable even with VAD implant - if goes to [facility] for transplant evaluation he is willing to route for [facility]   I reviewed the LVAD parameters from today, and compared the results to the patient's prior recorded data.  No programming changes were made.  The LVAD is functioning within specified parameters.  The patient performs LVAD self-test daily.  LVAD interrogation was negative for any significant power changes, alarms or PI events/speed drops.  LVAD equipment check completed and is in good working order.  Back-up equipment present.   LVAD education done on emergency procedures and precautions and reviewed exit site care.  Length of Stay: 0  Jacquelin Krajewski, Dalbert Garnet, PA-C 05/24/2023, 4:35 PM  VAD Team Pager [phone] (7am - 7am) +++VAD ISSUES  ONLY+++   Advanced Heart Failure Team Pager [phone] (M-F; 7a - 5p)  Please contact [facility] for night-coverage after hours (5p -7a ) and weekends on [email or web address] for all non- LVAD Issues

## 2023-05-24 NOTE — Progress Notes (Signed)
PHARMACY - ANTICOAGULATION CONSULT NOTE  Pharmacy Consult for Warfarin Indication:  LVAD  Allergies  Allergen Reactions   Plavix [Clopidogrel Bisulfate] Hives    Patient Measurements: Height: 5\' 5"  (165.1 cm) Weight: 79.4 kg (175 lb) IBW/kg (Calculated) : 61.5  Vital Signs: Temp: 98.8 F (37.1 C) (01/29 1645) Temp Source: Temporal (01/29 1645) BP: 109/88 (01/29 1645) Pulse Rate: 84 (01/29 1748)  Labs: Recent Labs    05/22/23 0000 05/24/23 1121  HGB  --  14.9  HCT  --  47.3  PLT  --  242  LABPROT  --  17.1*  INR 1.8* 1.4*  CREATININE  --  1.71*    Estimated Creatinine Clearance: 45.2 mL/min (A) (by C-G formula based on SCr of 1.71 mg/dL (H)).   Medical History: Past Medical History:  Diagnosis Date   AICD (automatic cardioverter/defibrillator) present    Anemia    Anxiety    CAD (coronary artery disease) 2009   AMI in 12/2007 with PCI to LAD, staged PCI to  mid/distal RCA, NSTEMI in 02/2009 with BMS to LCx   CHF (congestive heart failure) (HCC)    Chronic kidney disease 11/03/2016   stage 3 kidney disease   Dysrhythmia    "arrythmia problems at some point", "fatal rhythms"   History of blood transfusion    "I was bleeding from was rectum" (04/19/2017)   HLD (hyperlipidemia)    HTN (hypertension)    Hyperlipidemia 03/15/2011   Ischemic cardiomyopathy    Admitted in 07/2010 with CHF exacerbation   Myocardial infarction (HCC)    "I've had 4" (04/19/2017)   Pneumonia 2018 X 1   Seizures (HCC)    one seizure in 04/2016 during cardiac event (04/19/2017)   STEMI 2009 (anterior), 2010 (lateral), and 2012 (inferior) 03/14/2011    Medications:  Medications Prior to Admission  Medication Sig Dispense Refill Last Dose/Taking   amiodarone (PACERONE) 200 MG tablet Take 1 tablet (200 mg total) by mouth daily. 90 tablet 3 05/23/2023   amLODipine (NORVASC) 5 MG tablet Take 1 tablet (5 mg total) by mouth daily. 90 tablet 3 05/23/2023   atorvastatin (LIPITOR) 40 MG  tablet Take 1 tablet (40 mg total) by mouth daily. 90 tablet 3 05/23/2023   Blood Pressure Monitoring (BLOOD PRESSURE CUFF) MISC 1 Device by Does not apply route daily. 1 each 0 Taking   fluticasone (FLONASE) 50 MCG/ACT nasal spray Place 2 sprays into both nostrils daily. (Patient taking differently: Place 2 sprays into both nostrils daily as needed for allergies or rhinitis.) 9.9 mL 3 Past Week   losartan (COZAAR) 50 MG tablet Take 2 tablets (100 mg total) by mouth daily. (Patient taking differently: Take 50 mg by mouth 2 (two) times daily.) 180 tablet 3 05/23/2023   magnesium oxide (MAG-OX) 400 (240 Mg) MG tablet Take 1 tablet (400 mg total) by mouth 2 (two) times daily. 180 tablet 3 05/23/2023   Multiple Vitamin (MULTIVITAMIN WITH MINERALS) TABS tablet Take 1 tablet by mouth daily. 90 tablet 3 05/23/2023   pantoprazole (PROTONIX) 40 MG tablet Take 1 tablet (40 mg total) by mouth daily. 90 tablet 3 05/23/2023   polyethylene glycol (MIRALAX / GLYCOLAX) 17 g packet Take 17 g by mouth daily. (Patient taking differently: Take 17 g by mouth daily as needed for mild constipation or moderate constipation.) 30 each 0 Past Week   senna (SENOKOT) 8.6 MG TABS tablet Take 2 tablets (17.2 mg total) by mouth at bedtime. (Patient taking differently: Take 2 tablets by mouth  daily as needed for mild constipation or moderate constipation.) 120 tablet 0 Past Month   tamsulosin (FLOMAX) 0.4 MG CAPS capsule Take 1 capsule (0.4 mg total) by mouth daily. 90 capsule 3 05/23/2023   traZODone (DESYREL) 50 MG tablet Take 1 tablet (50 mg total) by mouth at bedtime. 90 tablet 3 05/23/2023   warfarin (COUMADIN) 2 MG tablet TAKE 2 MG (1 TABLET) EVERY MON, WED, AND FRIDAY, AND TAKE 1 MG (1/2 TABLET) ALL OTHER DAYS OR AS DIRECTED BY HF CLINIC 30 tablet 6 05/23/2023   acetaminophen (TYLENOL) 325 MG tablet Take 2 tablets (650 mg total) by mouth every 4 (four) hours as needed for headache or mild pain.   More than a month    Assessment: 58  y.o. presented for ICD lead extraction and replacement - done 1/29. Pt on warfarin PTA for LVAD. Admission INR 1.4. CBC ok on admission.  Home dose: 2mg  M/W/F and 1mg  all other days - last dose 1/28 pta.  INR goal 1.5-2 with h/o SAH  Goal of Therapy:  INR 1.5-2 Monitor platelets by anticoagulation protocol: Yes   Plan:  Dr. Lalla Brothers ordered 2mg  for tonight Daily INR  Christoper Fabian, PharmD, BCPS Please see amion for complete clinical pharmacist phone list 05/24/2023,6:21 PM

## 2023-05-24 NOTE — Progress Notes (Signed)
VAD Coordinator Procedure Note:    Time: Doppler Art BP Flow PI Power Speed                                                                 Recovery Area: 1640  76/64 (69) 4.9 2.7 4.1 5400   1700  82/68 (74) 5.0 2.7 4.1 5400    Patient Disposition: Patient tolerated the procedure well. VAD Coordinator accompanied and remained with patient in recovery area.  Transported to 2C11.   Alyce Pagan RN VAD Coordinator  Office: [phone]  24/7 Pager: [phone]

## 2023-05-24 NOTE — Transfer of Care (Signed)
Immediate Anesthesia Transfer of Care Note  Patient: David [name]  Procedure(s) Performed: LEAD EXTRACTION TRANSESOPHAGEAL ECHOCARDIOGRAM  Patient Location: Cath Lab  Anesthesia Type:General  Level of Consciousness: awake, drowsy, and patient cooperative  Airway & Oxygen Therapy: Patient Spontanous Breathing and Patient connected to face mask oxygen  Post-op Assessment: Report given to RN, Post -op Vital signs reviewed and stable, and Patient moving all extremities X 4  Post vital signs: Reviewed and stable  Last Vitals:  Vitals Value Taken Time  BP 99/81 05/24/23 1612  Temp    Pulse 82 05/24/23 1614  Resp 19 05/24/23 1614  SpO2 89 % 05/24/23 1614  Vitals shown include unfiled device data.  Last Pain:  Vitals:   05/24/23 1141  TempSrc:   PainSc: 0-No pain         Complications: No notable events documented.

## 2023-05-25 ENCOUNTER — Encounter (HOSPITAL_COMMUNITY): Payer: Self-pay | Admitting: Cardiology

## 2023-05-25 ENCOUNTER — Inpatient Hospital Stay (HOSPITAL_COMMUNITY): Payer: Medicare HMO

## 2023-05-25 DIAGNOSIS — N1831 Chronic kidney disease, stage 3a: Secondary | ICD-10-CM | POA: Diagnosis not present

## 2023-05-25 DIAGNOSIS — I480 Paroxysmal atrial fibrillation: Secondary | ICD-10-CM | POA: Diagnosis not present

## 2023-05-25 DIAGNOSIS — I5022 Chronic systolic (congestive) heart failure: Secondary | ICD-10-CM

## 2023-05-25 DIAGNOSIS — I472 Ventricular tachycardia, unspecified: Secondary | ICD-10-CM | POA: Diagnosis not present

## 2023-05-25 DIAGNOSIS — T82110A Breakdown (mechanical) of cardiac electrode, initial encounter: Secondary | ICD-10-CM | POA: Diagnosis not present

## 2023-05-25 LAB — CBC
HCT: 41.4 % (ref 39.0–52.0)
Hemoglobin: 13 g/dL (ref 13.0–17.0)
MCH: 25 pg — ABNORMAL LOW (ref 26.0–34.0)
MCHC: 31.4 g/dL (ref 30.0–36.0)
MCV: 79.8 fL — ABNORMAL LOW (ref 80.0–100.0)
Platelets: 209 10*3/uL (ref 150–400)
RBC: 5.19 MIL/uL (ref 4.22–5.81)
RDW: 15.7 % — ABNORMAL HIGH (ref 11.5–15.5)
WBC: 10 10*3/uL (ref 4.0–10.5)
nRBC: 0 % (ref 0.0–0.2)

## 2023-05-25 LAB — BASIC METABOLIC PANEL
Anion gap: 12 (ref 5–15)
BUN: 18 mg/dL (ref 6–20)
CO2: 18 mmol/L — ABNORMAL LOW (ref 22–32)
Calcium: 8.2 mg/dL — ABNORMAL LOW (ref 8.9–10.3)
Chloride: 104 mmol/L (ref 98–111)
Creatinine, Ser: 1.75 mg/dL — ABNORMAL HIGH (ref 0.61–1.24)
GFR, Estimated: 44 mL/min — ABNORMAL LOW (ref >60)
Glucose, Bld: 180 mg/dL — ABNORMAL HIGH (ref 70–99)
Potassium: 4.5 mmol/L (ref 3.5–5.1)
Sodium: 134 mmol/L — ABNORMAL LOW (ref 135–145)

## 2023-05-25 LAB — PROTIME-INR
INR: 1.5 — ABNORMAL HIGH (ref 0.8–1.2)
Prothrombin Time: 18.3 s — ABNORMAL HIGH (ref 11.4–15.2)

## 2023-05-25 LAB — LACTATE DEHYDROGENASE: LDH: 191 U/L (ref 98–192)

## 2023-05-25 MED ORDER — WARFARIN SODIUM 2 MG PO TABS: 2.0000 mg | ORAL_TABLET | ORAL | Status: DC

## 2023-05-25 MED ORDER — WARFARIN SODIUM 1 MG PO TABS
1.0000 mg | ORAL_TABLET | ORAL | Status: AC
  Administered 2023-05-25: 1 mg via ORAL
  Filled 2023-05-25: qty 1

## 2023-05-25 MED ORDER — NAPHAZOLINE-GLYCERIN 0.012-0.25 % OP SOLN: 1.0000 [drp] | Freq: Four times a day (QID) | OPHTHALMIC | Status: DC | PRN

## 2023-05-25 MED FILL — Fentanyl Citrate Preservative Free (PF) Inj 100 MCG/2ML: INTRAMUSCULAR | Qty: 3 | Status: AC

## 2023-05-25 NOTE — Progress Notes (Signed)
LVAD Coordinator Rounding Note:  Admitted 05/24/23 due to lead extraction/exchange with Dr Lalla Brothers.   HM3 LVAD implanted on 04/20/17 by Dr Laneta Simmers under destination therapy criteria.  Pt laying in bed on my arrival. Denies complaints other than minor chest soreness.   Large pressure dressing on left chest- small hematoma noted per Dr Lalla Brothers. Will plan to keep pt today to closely monitor. Right groin dressing with minor drainage on dressing. No hematoma noted. Called and updated pt's wife Vikki Ports on plan.   Vital signs: HR: 70 Doppler Pressure: 80 Arterial BP: 80/67 (73) O2 Sat: 92% on RA Wt: 170.8 lbs    LVAD interrogation reveals:  Speed: 5400 Flow: 5.0 Power: 4.0 w PI: 2.7   Alarms: none Events:  rare Hematocrit: 41  Fixed speed: 5400 Low speed limit: 5100  Drive Line: Existing dressing clean, dry, and intact. Anchor correctly applied. Weekly dressing maintained by pt's wife at home. Weekly dressing changes using weekly kit by bedside RN. Next dressing change due: 05/30/23.  Labs:  LDH trend: 191  INR trend: 1.5  Anticoagulation Plan: -INR Goal: 1.5-2.0 -ASA Dose: none  Device: -Medtronic BiV -Therapies: ON  Arrythmias: Hx Afib/AF  Plan/Recommendations:  1. Page VAD coordinator with any drive line or equipment concerns 2. Weekly drive line dressing changes by bedside RN  Alyce Pagan RN VAD Coordinator  Office: [phone]  24/7 Pager: [phone]

## 2023-05-25 NOTE — Progress Notes (Signed)
Advanced Heart Failure Rounding Note  Cardiologist: Arvilla Meres, MD  Chief Complaint: s/p ICD lead exchange Subjective:    Feeling well this morning. Lying in bed. Ambulated in hall overnight. No SOB or CP.  Objective:    Weight Range: 77.5 kg Body mass index is 28.43 kg/m.   Vital Signs:   Temp:  [97.8 F (36.6 C)-98.9 F (37.2 C)] 98.3 F (36.8 C) (01/30 0306) Pulse Rate:  [77-130] 98 (01/30 0538) Resp:  [12-19] 19 (01/30 0538) BP: (83-124)/(58-98) 109/88 (01/30 0200) SpO2:  [87 %-97 %] 96 % (01/30 0306) Arterial Line BP: (74-92)/(62-79) 91/77 (01/30 0538) Weight:  [77.5 kg-79.4 kg] 77.5 kg (01/30 0404) Last BM Date : 05/24/23  Weight change: Filed Weights   05/24/23 1133 05/25/23 0404  Weight: 79.4 kg 77.5 kg    Intake/Output:   Intake/Output Summary (Last 24 hours) at 05/25/2023 0703 Last data filed at 05/25/2023 0350 Gross per 24 hour  Intake 560 ml  Output 600 ml  Net -40 ml    Physical Exam    General: Well appearing. No distress on RA Cardiac: JVP nto visible. Mechanical heart sounds with LVAD hum present.  Driveline: Dressing C/D/I. No drainage or redness. Anchor in place. Extremities: Warm and dry. No rash, cyanosis, or edema.  Neuro: Alert and oriented x3. Affect pleasant. Moves all extremities without difficulty.  LVAD Interrogation HM III: Speed: 5400 Flow: 4.7 PI: 3.9 Power: 4.0. No PI events  Telemetry   SR in 70s (personally reviewed)  EKG    SR 77 bpm with 1 deg AVB (personally reviewed)  Labs    CBC Recent Labs    05/24/23 1834 05/25/23 0405  WBC 12.4* 10.0  NEUTROABS 11.8*  --   HGB 13.6 13.0  HCT 42.8 41.4  MCV 78.8* 79.8*  PLT 196 209   Basic Metabolic Panel Recent Labs    16/10/96 1834 05/25/23 0405  NA 136 134*  K 4.1 4.5  CL 106 104  CO2 20* 18*  GLUCOSE 137* 180*  BUN 16 18  CREATININE 1.66* 1.75*  CALCIUM 8.5* 8.2*  MG 1.7  --    Liver Function Tests Recent Labs    05/24/23 1834  AST 24   ALT 18  ALKPHOS 100  BILITOT 0.7  PROT 7.9  ALBUMIN 2.9*   No results for input(s): "LIPASE", "AMYLASE" in the last 72 hours. Cardiac Enzymes No results for input(s): "CKTOTAL", "CKMB", "CKMBINDEX", "TROPONINI" in the last 72 hours.  BNP: BNP (last 3 results) No results for input(s): "BNP" in the last 8760 hours.  ProBNP (last 3 results) No results for input(s): "PROBNP" in the last 8760 hours.   D-Dimer No results for input(s): "DDIMER" in the last 72 hours. Hemoglobin A1C No results for input(s): "HGBA1C" in the last 72 hours. Fasting Lipid Panel No results for input(s): "CHOL", "HDL", "LDLCALC", "TRIG", "CHOLHDL", "LDLDIRECT" in the last 72 hours. Thyroid Function Tests No results for input(s): "TSH", "T4TOTAL", "T3FREE", "THYROIDAB" in the last 72 hours.  Invalid input(s): "FREET3"  Other results:   Imaging    EP PPM/ICD IMPLANT Result Date: 05/24/2023  CONCLUSIONS:  1. Chronic systolic heart failure with ICD in situ.  2. ICD lead malfunction  3. Successful ICD lead extraction, epicardial lead removal x 2 and generator replacement  4. Successful pocket capsulectomy  5. Restart coumadin this evening  6. Maintain pressure bandage for 48 hours  7. No early apparent complications.   EP STUDY Result Date: 05/24/2023  CONCLUSIONS:  1. Chronic systolic heart failure with ICD in situ.  2. ICD lead malfunction  3. Successful ICD lead extraction, epicardial lead removal x 2 and generator replacement  4. Successful pocket capsulectomy  5. Restart coumadin this evening  6. Maintain pressure bandage for 48 hours  7. No early apparent complications.   Medications:    Scheduled Medications:  sodium chloride   Intravenous Once   sodium chloride   Intravenous Once   amiodarone  200 mg Oral Daily   amLODipine  5 mg Oral Daily   atorvastatin  40 mg Oral Daily   losartan  50 mg Oral BID   magnesium oxide  400 mg Oral BID   pantoprazole  40 mg Oral Daily   tamsulosin  0.4 mg Oral  Daily   traZODone  50 mg Oral QHS   Warfarin - Pharmacist Dosing Inpatient   Does not apply q1600    Infusions:   PRN Medications: acetaminophen, acetaminophen, ondansetron (ZOFRAN) IV, oxyCODONE-acetaminophen, traMADol  Patient Profile   David [name] is a 58 y.o. male with history of CAD (09 PCI to LAD, mRCA, '10 PCI to Lcx and '12 mRCA), HFrEF (EF ~20% for > 5 years)/ICM s/p HM III LVAD 12/19, VT with prior ICD shocks, HLD and HTN.    Admitted for ICD lead extraction and reimplantation d/t lead failure.  Assessment/Plan   ICD lead exchange - presented today for lead exchange. Procedure without complication.  - warfarin resumed overnight. Dosing d/w PharmD   Chronic systolic heart failure s/p HM III LVAD implanted 04/21/27 - Doing well NYHA I with VAD support - post-op CXR personally reviewed VAD stable, no pulm edema - volume status ok - Restart Losartan 50 mg bid - Continued conversations with him and his wife regarding whether or not he will reconsider transplant evaluation after recent VT (he has deferred in past).   3.  Ventricular tachycardia - Had ICD shock x 2 for VT on 03/23/23 (device reprogrammed at the time) - No recurrence. - Continue amiodarone 200mg  daily.  - Lead exchange as in #1   3. PAF/AFL - had DC-CV on 10/17/18 with subsequent short episodes of AF on amio 100 daily - Had recurrent AF in 10/22. Amio increased to 200 bid and converted.  - Continue amio. Warfarin as in #1   4. SAH - admit in 2/23 with [facility] requiring ventriculostomy - much improved. HAs resolved.  - Continue to follow MAPs closely - No significant residual deficits   5. h/o UGI AVM bleed - s/p APC x 4 lesions in 2/19 - No recurrent bleeding - Continue PPI - Off ASA due to GIB.    6. CKD Stage IIIa:  - Creatinine baseline 1.3 - 1.7, stable 1.75 today - Continue to follow   7. VAD management - LDH stable 190s - INR goal 1.5-2.0 with h/o SAH. Off ASA  - INR 1.5 today.  Warfarin resumed overnight. Dosing d/w PharmD - Dopplers ok, DL ok   8. CAD:  s/p PCI RCA/PLOM with DES x2 and DES to mild LAD 1/18 - No s/s angina - Continue lipitor 40 mg daily. Off ASA due to GIB.   9. HTN - needs tight BP control with h/o SAH - Resume Norvasc 5 mg daily today - Dopplers ok   10. [employer] fan - unfortunately this has been incurable even with VAD implant - if goes to [facility] for transplant evaluation he is willing to route for [facility]  Stable from a HF  standpoint.  I reviewed the LVAD parameters from today, and compared the results to the patient's prior recorded data.  No programming changes were made.  The LVAD is functioning within specified parameters.  The patient performs LVAD self-test daily.  LVAD interrogation was negative for any significant power changes, alarms or PI events/speed drops.  LVAD equipment check completed and is in good working order.  Back-up equipment present.   LVAD education done on emergency procedures and precautions and reviewed exit site care.  Length of Stay: 1  [place] Tanicia Wolaver, NP  05/25/2023, 7:03 AM  Advanced Heart Failure Team Pager [phone] (M-F; 7a - 5p)  Please contact [facility] for night-coverage after hours (5p -7a ) and weekends on [email or web address]

## 2023-05-25 NOTE — Care Management CC44 (Signed)
Condition Code 44 Documentation Completed  Patient Details  Name: AMIL BOUWMAN MRN: [id number] Date of Birth: 15-May-1965   Condition Code 44 given:  Yes Patient signature on Condition Code 44 notice:  Yes Documentation of 2 MD's agreement:  Yes Code 44 added to claim:  Yes    Elliot Cousin, RN 05/25/2023, 5:08 PM

## 2023-05-25 NOTE — Discharge Summary (Cosign Needed)
Advanced Heart Failure Team  Discharge Summary   Patient ID: David [name] MRN: [id number], DOB/AGE: [birth year] 58 y.o. Admit date: 05/24/2023 D/C date:     05/26/2023   Primary Discharge Diagnoses:  ICD Lead Exchange Chronic Systolic Heart Failure  S/P HM3 LVAD  Secondary Discharge Diagnoses:  VT PAF/AFL H/o SAH H/o UGI AVM bleed CKD Stage 3a CAD  [name] Course:   David [name] is a 58 y.o. male with history of CAD (09 PCI to LAD, mRCA, '10 PCI to Lcx and '12 mRCA), HFrEF (EF ~20% for > 5 years), ICM s/p HM III LVAD 12/19, VT with prior ICD shocks, HLD and HTN.    Patient presented for scheduled ICD extraction and replacement with Dr. Lalla Brothers on 05/24/23. Post-op course complicated by small L chest hematoma. He was held overnight for observation, while resuming his home Warfarin. Hgb remained stable. Hematoma unchanged. INR jumped overnight 1/30 so will hold Warfarin dose tonight and resume home dose tomorrow, per EP.   Has been seen by Dr. Lalla Brothers and Dr. [name] and determined stable for discharge today with EP follow up.   [name] Course by Problem List:   ICD lead exchange - s/p 05/24/23 with Dr. Lalla Brothers - small L chest hematoma. Hgb stable.    Chronic systolic heart failure s/p HM III LVAD implanted 04/21/27 - NYHA I with VAD support - post-op CXR personally reviewed VAD stable, no pulm edema - volume status ok - continue Losartan 50 mg bid - continued conversations with him and his wife regarding whether or not he will reconsider transplant evaluation after recent VT (he has deferred in past)   3.  Ventricular tachycardia - had ICD shock x 2 for VT on 03/23/23 (device reprogrammed at the time) - no recurrence - continue amiodarone 200mg  daily - lead exchange as in #1   3. PAF/AFL - had DC-CV on 6/20 with subsequent short episodes of AF on amio 100 daily - had recurrent AF in 10/22. Amio increased to 200 bid and converted.  - continue amio 200 mg daily.  Warfarin as in #7   4. h/o SAH - admit in 2/23 with [name] requiring ventriculostomy - no significant residual deficits   5. h/o UGI AVM bleed - s/p APC x 4 lesions in 2/19 - no recurrent bleeding - continue PPI - off ASA due to GIB.    6. CKD Stage IIIa - creatinine baseline 1.3 - 1.7 - stable.   7. VAD management - LDH stable - INR goal 1.5-2.0 with h/o SAH. Off ASA  - Hold warfarin dose tonight and resume home dose tomorrow, d/w EP - Dopplers ok, DL ok   8. CAD:  s/p PCI RCA/PLOM with DES x2 and DES to mild LAD 1/18 - No s/s angina - Continue lipitor 40 mg daily. Off ASA due to GIB.   9. HTN - continue Norvasc 5 mg daily  - Dopplers ok  Discharge Weight Range: 80.1 kg Discharge Vitals: Blood pressure 98/64, pulse (!) 30, temperature 98 F (36.7 C), temperature source Oral, resp. rate 16, height 5\' 5"  (1.651 m), weight 80.1 kg, SpO2 97%.  Labs: Lab Results  Component Value Date   WBC 15.6 (H) 05/26/2023   HGB 11.9 (L) 05/26/2023   HCT 38.0 (L) 05/26/2023   MCV 79.7 (L) 05/26/2023   PLT 203 05/26/2023    Recent Labs  Lab 05/24/23 1834 05/25/23 0405 05/26/23 0222  NA 136   < > 134*  K 4.1   < >  4.8  CL 106   < > 102  CO2 20*   < > 23  BUN 16   < > 19  CREATININE 1.66*   < > 1.73*  CALCIUM 8.5*   < > 8.5*  PROT 7.9  --   --   BILITOT 0.7  --   --   ALKPHOS 100  --   --   ALT 18  --   --   AST 24  --   --   GLUCOSE 137*   < > 116*   < > = values in this interval not displayed.   Lab Results  Component Value Date   CHOL 178 04/02/2018   HDL 42 04/02/2018   LDLCALC 111 (H) 04/02/2018   TRIG 124 04/02/2018   BNP (last 3 results) No results for input(s): "BNP" in the last 8760 hours.  ProBNP (last 3 results) No results for input(s): "PROBNP" in the last 8760 hours.   Diagnostic Studies/Procedures   DG Chest 2 View Result Date: 05/25/2023 CLINICAL DATA:  ICD in place. EXAM: CHEST - 2 VIEW COMPARISON:  06/11/2021 FINDINGS: There is a left chest  wall ICD with leads in the right atrial appendage and right ventricle. LVAD is noted overlying the apex. Previous median sternotomy. Stable mild cardiac enlargement. Small pleural effusions. Interstitial edema appears decreased from the previous exam. No new findings. IMPRESSION: 1. Interval decrease in interstitial edema. 2. Small pleural effusions. Electronically Signed   By: [name] M.D.   On: 05/25/2023 09:42    Discharge Medications   Allergies as of 05/26/2023       Reactions   Plavix [clopidogrel Bisulfate] Hives        Medication List     TAKE these medications    acetaminophen 325 MG tablet Commonly known as: TYLENOL Take 2 tablets (650 mg total) by mouth every 4 (four) hours as needed for headache or mild pain.   amiodarone 200 MG tablet Commonly known as: PACERONE Take 1 tablet (200 mg total) by mouth daily.   amLODipine 5 MG tablet Commonly known as: NORVASC Take 1 tablet (5 mg total) by mouth daily.   atorvastatin 40 MG tablet Commonly known as: LIPITOR Take 1 tablet (40 mg total) by mouth daily.   Blood Pressure Cuff Misc 1 Device by Does not apply route daily.   fluticasone 50 MCG/ACT nasal spray Commonly known as: FLONASE Place 2 sprays into both nostrils daily. What changed:  when to take this reasons to take this   losartan 50 MG tablet Commonly known as: COZAAR Take 2 tablets (100 mg total) by mouth daily. What changed:  how much to take when to take this   magnesium oxide 400 (240 Mg) MG tablet Commonly known as: MAG-OX Take 1 tablet (400 mg total) by mouth 2 (two) times daily.   multivitamin with minerals Tabs tablet Take 1 tablet by mouth daily.   oxyCODONE-acetaminophen 5-325 MG tablet Commonly known as: PERCOCET/ROXICET Take 1 tablet by mouth every 6 (six) hours as needed for up to 3 days for severe pain (pain score 7-10).   pantoprazole 40 MG tablet Commonly known as: PROTONIX Take 1 tablet (40 mg total) by mouth daily.    polyethylene glycol 17 g packet Commonly known as: MIRALAX / GLYCOLAX Take 17 g by mouth daily. What changed:  when to take this reasons to take this   senna 8.6 MG Tabs tablet Commonly known as: SENOKOT Take 2 tablets (17.2 mg total)  by mouth at bedtime. What changed:  when to take this reasons to take this   tamsulosin 0.4 MG Caps capsule Commonly known as: FLOMAX Take 1 capsule (0.4 mg total) by mouth daily.   traZODone 50 MG tablet Commonly known as: DESYREL Take 1 tablet (50 mg total) by mouth at bedtime.   warfarin 2 MG tablet Commonly known as: COUMADIN Take as directed. If you are unsure how to take this medication, talk to your nurse or doctor. Original instructions: TAKE 2 MG (1 TABLET) EVERY MON, WED, AND FRIDAY, AND TAKE 1 MG (1/2 TABLET) ALL OTHER DAYS OR AS DIRECTED BY HF CLINIC Start taking on: May 27, 2023        Disposition   The patient will be discharged in stable condition to home. Discharge Instructions     (HEART FAILURE PATIENTS) Call MD:  Anytime you have any of the following symptoms: 1) 3 pound weight gain in 24 hours or 5 pounds in 1 week 2) shortness of breath, with or without a dry hacking cough 3) swelling in the hands, feet or stomach 4) if you have to sleep on extra pillows at night in order to breathe.   Complete by: As directed    Diet - low sodium heart healthy   Complete by: As directed    Heart Failure patients record your daily weight using the same scale at the same time of day   Complete by: As directed    Increase activity slowly   Complete by: As directed          APP Duration of Discharge Encounter: 18 minutes  Signed, [place] Lee, NP  05/26/2023, 2:13 PM

## 2023-05-25 NOTE — Progress Notes (Signed)
  Patient Name: David [name] Date of Encounter: 05/25/2023  Primary Cardiologist: Arvilla Meres, MD Electrophysiologist: Sherryl Manges, MD  Interval Summary   The patient is doing well today.  At this time, the patient denies chest pain, shortness of breath, or any new concerns.  Vital Signs    Vitals:   05/25/23 0404 05/25/23 0538 05/25/23 0738 05/25/23 1057  BP:   (!) 108/91 101/81  Pulse:  98 75 77  Resp:  19 19 16   Temp:   98 F (36.7 C) 98.2 F (36.8 C)  TempSrc:   Oral Oral  SpO2:   91% 91%  Weight: 77.5 kg     Height:        Intake/Output Summary (Last 24 hours) at 05/25/2023 1249 Last data filed at 05/25/2023 1214 Gross per 24 hour  Intake 796 ml  Output 800 ml  Net -4 ml   Filed Weights   05/24/23 1133 05/25/23 0404  Weight: 79.4 kg 77.5 kg    Physical Exam    GEN- The patient is well appearing, alert and oriented x 3 today.   Lungs- Clear to ausculation bilaterally, normal work of breathing Cardiac- Regular rate and rhythm, no murmurs, rubs or gallops.  Left chest site with mild edema/hematoma that tracks down into axillary area but is soft, no significant bruising.  Pressure dressing reapplied. GI- soft, NT, ND, + BS Extremities- no clubbing or cyanosis. No edema  Telemetry    SR 60's with 1AVB, multifocal PVC's (personally reviewed)  Hospital Course    David [name] is a 58 y.o. male admitted for ICD lead failure with planned revision. Hx of ICD and LVAD for end stage cardiomyopathy, ventricular tachycardia with recent appropriate shocks and evidence of recent ICD lead failure.  Patient underwent ICD lead revision on 05/24/2023 per Dr. Lalla Brothers.  In addition to extraction of ICD lead he had removal of epicardial leads x 2, full capsulectomy, and generator replacement.  Assessment & Plan    Chronic systolic heart failure with ICD in situ ICD lead malfunction status post extraction, epicardial lead removal and generator replacement  (1/29) Ventricular tachycardia LVAD in situ Secondary hypercoagulable state, on Coumadin -Continue pressure dressing that is currently in place until Monday, 05/29/2023 and it can be removed in the device clinic -interrogation on 1/30 shows normal device function  -CXR with leads in good position and no PTX  -PT efforts as able  -anticoagulation per primary   For questions or updates, please contact [facility] Please consult [email or web address] for contact info under Cardiology/STEMI.  Signed, Canary Brim, NP-C, AGACNP-BC [facility] - Electrophysiology  05/25/2023, 12:49 PM

## 2023-05-25 NOTE — Anesthesia Postprocedure Evaluation (Signed)
Anesthesia Post Note  Patient: David [name]  Procedure(s) Performed: LEAD EXTRACTION TRANSESOPHAGEAL ECHOCARDIOGRAM     Patient location during evaluation: PACU Anesthesia Type: General Level of consciousness: awake and alert Pain management: pain level controlled Vital Signs Assessment: post-procedure vital signs reviewed and stable Respiratory status: spontaneous breathing, nonlabored ventilation, respiratory function stable and patient connected to nasal cannula oxygen Cardiovascular status: blood pressure returned to baseline and stable Postop Assessment: no apparent nausea or vomiting Anesthetic complications: no   No notable events documented.       Mariann Barter

## 2023-05-25 NOTE — Progress Notes (Signed)
PHARMACY - ANTICOAGULATION CONSULT NOTE  Pharmacy Consult for Warfarin Indication:  LVAD  Allergies  Allergen Reactions   Plavix [Clopidogrel Bisulfate] Hives    Patient Measurements: Height: 5\' 5"  (165.1 cm) Weight: 77.5 kg (170 lb 13.7 oz) IBW/kg (Calculated) : 61.5  Vital Signs: Temp: 98 F (36.7 C) (01/30 0738) Temp Source: Oral (01/30 0738) BP: 108/91 (01/30 0738) Pulse Rate: 75 (01/30 0738)  Labs: Recent Labs    05/24/23 1121 05/24/23 1834 05/25/23 0405  HGB 14.9 13.6 13.0  HCT 47.3 42.8 41.4  PLT 242 196 209  LABPROT 17.1* 17.1* 18.3*  INR 1.4* 1.4* 1.5*  CREATININE 1.71* 1.66* 1.75*    Estimated Creatinine Clearance: 43.7 mL/min (A) (by C-G formula based on SCr of 1.75 mg/dL (H)).   Medical History: Past Medical History:  Diagnosis Date   AICD (automatic cardioverter/defibrillator) present    Anemia    Anxiety    CAD (coronary artery disease) 2009   AMI in 12/2007 with PCI to LAD, staged PCI to  mid/distal RCA, NSTEMI in 02/2009 with BMS to LCx   CHF (congestive heart failure) (HCC)    Chronic kidney disease 11/03/2016   stage 3 kidney disease   Dysrhythmia    "arrythmia problems at some point", "fatal rhythms"   History of blood transfusion    "I was bleeding from was rectum" (04/19/2017)   HLD (hyperlipidemia)    HTN (hypertension)    Hyperlipidemia 03/15/2011   Ischemic cardiomyopathy    Admitted in 07/2010 with CHF exacerbation   Myocardial infarction (HCC)    "I've had 4" (04/19/2017)   Pneumonia 2018 X 1   Seizures (HCC)    one seizure in 04/2016 during cardiac event (04/19/2017)   STEMI 2009 (anterior), 2010 (lateral), and 2012 (inferior) 03/14/2011    Medications:  Medications Prior to Admission  Medication Sig Dispense Refill Last Dose/Taking   amiodarone (PACERONE) 200 MG tablet Take 1 tablet (200 mg total) by mouth daily. 90 tablet 3 05/23/2023   amLODipine (NORVASC) 5 MG tablet Take 1 tablet (5 mg total) by mouth daily. 90 tablet  3 05/23/2023   atorvastatin (LIPITOR) 40 MG tablet Take 1 tablet (40 mg total) by mouth daily. 90 tablet 3 05/23/2023   Blood Pressure Monitoring (BLOOD PRESSURE CUFF) MISC 1 Device by Does not apply route daily. 1 each 0 Taking   fluticasone (FLONASE) 50 MCG/ACT nasal spray Place 2 sprays into both nostrils daily. (Patient taking differently: Place 2 sprays into both nostrils daily as needed for allergies or rhinitis.) 9.9 mL 3 Past Week   losartan (COZAAR) 50 MG tablet Take 2 tablets (100 mg total) by mouth daily. (Patient taking differently: Take 50 mg by mouth 2 (two) times daily.) 180 tablet 3 05/23/2023   magnesium oxide (MAG-OX) 400 (240 Mg) MG tablet Take 1 tablet (400 mg total) by mouth 2 (two) times daily. 180 tablet 3 05/23/2023   Multiple Vitamin (MULTIVITAMIN WITH MINERALS) TABS tablet Take 1 tablet by mouth daily. 90 tablet 3 05/23/2023   pantoprazole (PROTONIX) 40 MG tablet Take 1 tablet (40 mg total) by mouth daily. 90 tablet 3 05/23/2023   polyethylene glycol (MIRALAX / GLYCOLAX) 17 g packet Take 17 g by mouth daily. (Patient taking differently: Take 17 g by mouth daily as needed for mild constipation or moderate constipation.) 30 each 0 Past Week   senna (SENOKOT) 8.6 MG TABS tablet Take 2 tablets (17.2 mg total) by mouth at bedtime. (Patient taking differently: Take 2 tablets by mouth  daily as needed for mild constipation or moderate constipation.) 120 tablet 0 Past Month   tamsulosin (FLOMAX) 0.4 MG CAPS capsule Take 1 capsule (0.4 mg total) by mouth daily. 90 capsule 3 05/23/2023   traZODone (DESYREL) 50 MG tablet Take 1 tablet (50 mg total) by mouth at bedtime. 90 tablet 3 05/23/2023   warfarin (COUMADIN) 2 MG tablet TAKE 2 MG (1 TABLET) EVERY MON, WED, AND FRIDAY, AND TAKE 1 MG (1/2 TABLET) ALL OTHER DAYS OR AS DIRECTED BY HF CLINIC 30 tablet 6 05/23/2023   acetaminophen (TYLENOL) 325 MG tablet Take 2 tablets (650 mg total) by mouth every 4 (four) hours as needed for headache or mild pain.    More than a month    Assessment: 58 y.o. presented for ICD lead extraction and replacement - done 1/29. Pt on warfarin PTA for LVAD. Admission INR 1.4. CBC ok on admission.  Home dose: 2mg  M/W/F and 1mg  all other days - last dose 1/28 pta.  INR goal 1.5-2 with h/o SAH  Goal of Therapy:  INR 1.5-2 Monitor platelets by anticoagulation protocol: Yes   Plan:  Resume Coumadin at PTA dose: warfarin 1 mg daily except 2 mg on MWF. Daily INR  Reece Leader, Colon Flattery, [facility] Clinical Pharmacist  05/25/2023 8:05 AM   [facility] pharmacy phone numbers are listed on [email or web address]

## 2023-05-25 NOTE — Plan of Care (Signed)
Discussed with patient plan of care for evening, pain management and eye irritation with some teach displayed.   Problem: Education: Goal: Patient will understand all VAD equipment and how it functions Outcome: Progressing   Problem: Cardiac: Goal: Ability to achieve and maintain adequate cardiopulmonary perfusion will improve Outcome: Progressing   Problem: Pain Managment: Goal: General experience of comfort will improve and/or be controlled Outcome: Progressing

## 2023-05-25 NOTE — TOC Initial Note (Addendum)
Transition of Care [facility]) - Initial/Assessment Note    Patient Details  Name: David [name] MRN: [id number] Date of Birth: 19-Nov-1965  Transition of Care [facility]) CM/SW Contact:    Elliot Cousin, RN Phone Number: [phone] 05/25/2023, 12:42 PM  Clinical Narrative:                 HF TOC CM spoke to pt at bedside. Gave permission to speak to wife. Pt states he cannot sign form at at this time, hand is in a splint. Pt reports having scale and RW at home. Wife takes him to appts.  Spoke to wife and reviewed Medicare Code 44. Wife agreeable, states she will be coming tomorrow and will review form.   Expected Discharge Plan: Home/Self Care Barriers to Discharge: Continued Medical Work up   Patient Goals and CMS Choice Patient states their goals for this hospitalization and ongoing recovery are:: wants to go home          Expected Discharge Plan and Services   Discharge Planning Services: CM Consult   Living arrangements for the past 2 months: Single Family Home                                      Prior Living Arrangements/Services Living arrangements for the past 2 months: Single Family Home Lives with:: Spouse Patient language and need for interpreter reviewed:: Yes Do you feel safe going back to the place where you live?: Yes      Need for Family Participation in Patient Care: Yes (Comment) Care giver support system in place?: Yes (comment) Current home services: DME (rolling walker, scale) Criminal Activity/Legal Involvement Pertinent to Current Situation/Hospitalization: No - Comment as needed  Activities of Daily Living   ADL Screening (condition at time of admission) Independently performs ADLs?: Yes (appropriate for developmental age) Is the patient deaf or have difficulty hearing?: No Does the patient have difficulty seeing, even when wearing glasses/contacts?: No Does the patient have difficulty concentrating, remembering, or making decisions?:  No  Permission Sought/Granted Permission sought to share information with : Case Manager, Family Supports, PCP Permission granted to share information with : Yes, Verbal Permission Granted  Share Information with NAME: David [name]     Permission granted to share info w Relationship: wife  Permission granted to share info w Contact Information: [phone]  Emotional Assessment Appearance:: Appears stated age Attitude/Demeanor/Rapport: Engaged Affect (typically observed): Accepting Orientation: : Oriented to Self, Oriented to Place, Oriented to  Time, Oriented to Situation   Psych Involvement: No (comment)  Admission diagnosis:  Malfunction of implantable defibrillator ventricular (ICD) lead [T82.198A] ICD (implantable cardioverter-defibrillator) lead failure [T82.110A] Failure of implantable cardioverter-defibrillator (ICD) lead [T82.110A] Patient Active Problem List   Diagnosis Date Noted   Malfunction of implantable defibrillator ventricular (ICD) lead 05/24/2023   ICD (implantable cardioverter-defibrillator) lead failure 05/24/2023   Failure of implantable cardioverter-defibrillator (ICD) lead 05/24/2023   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 08/30/2022   SAH (subarachnoid hemorrhage) (HCC)    LVAD (left ventricular assist device) present (HCC)    ICH (intracerebral hemorrhage) (HCC) 06/05/2021   Left ventricular assist device present (HCC) 06/08/2017   Gastric AVM    Presence of left ventricular assist device (LVAD) (HCC) 04/20/2017   Palliative care encounter    Ischemic cardiomyopathy 11/07/2016   CKD (chronic kidney disease), stage III (HCC) 05/13/2016   PAF (paroxysmal atrial  fibrillation) (HCC)    Chronic systolic heart failure (HCC) 03/15/2011   PCP:  Gordan Payment., MD Pharmacy:   [facility] [address], [place] - [address] [facility] [facility] [address] [place] [address] Phone: [phone] Fax: [phone]     Social  Drivers of Health (SDOH) Social History: SDOH Screenings   Food Insecurity: Food Insecurity Present (05/24/2023)  Housing: Low Risk  (05/24/2023)  Transportation Needs: No Transportation Needs (05/24/2023)  Utilities: At Risk (05/24/2023)  Depression (PHQ2-9): High Risk (07/19/2021)  Financial Resource Strain: High Risk (07/04/2022)  Physical Activity: Inactive (03/10/2017)  Stress: No Stress Concern Present (03/10/2017)  Tobacco Use: Medium Risk (05/22/2023)   SDOH Interventions:     Readmission Risk Interventions     No data to display

## 2023-05-25 NOTE — Plan of Care (Signed)
  Problem: Education: Goal: Patient will understand all VAD equipment and how it functions Outcome: Progressing   Problem: Cardiac: Goal: LVAD will function as expected and patient will experience no clinical alarms Outcome: Progressing   Problem: Education: Goal: Knowledge of cardiac device and self-care will improve Outcome: Progressing   Problem: Education: Goal: Knowledge of General Education information will improve Description: Including pain rating scale, medication(s)/side effects and non-pharmacologic comfort measures Outcome: Progressing   Problem: Health Behavior/Discharge Planning: Goal: Ability to manage health-related needs will improve Outcome: Progressing   Problem: Clinical Measurements: Goal: Will remain free from infection Outcome: Progressing

## 2023-05-25 NOTE — Care Management Obs Status (Signed)
MEDICARE OBSERVATION STATUS NOTIFICATION   Patient Details  Name: David [name] MRN: [id number] Date of Birth: 07-05-1965   Medicare Observation Status Notification Given:  Yes    Elliot Cousin, RN 05/25/2023, 5:08 PM

## 2023-05-26 ENCOUNTER — Other Ambulatory Visit (HOSPITAL_COMMUNITY): Payer: Self-pay

## 2023-05-26 DIAGNOSIS — T82110A Breakdown (mechanical) of cardiac electrode, initial encounter: Secondary | ICD-10-CM | POA: Diagnosis not present

## 2023-05-26 DIAGNOSIS — I5022 Chronic systolic (congestive) heart failure: Secondary | ICD-10-CM | POA: Diagnosis not present

## 2023-05-26 LAB — LACTATE DEHYDROGENASE: LDH: 178 U/L (ref 98–192)

## 2023-05-26 LAB — BASIC METABOLIC PANEL
Anion gap: 9 (ref 5–15)
BUN: 19 mg/dL (ref 6–20)
CO2: 23 mmol/L (ref 22–32)
Calcium: 8.5 mg/dL — ABNORMAL LOW (ref 8.9–10.3)
Chloride: 102 mmol/L (ref 98–111)
Creatinine, Ser: 1.73 mg/dL — ABNORMAL HIGH (ref 0.61–1.24)
GFR, Estimated: 45 mL/min — ABNORMAL LOW (ref >60)
Glucose, Bld: 116 mg/dL — ABNORMAL HIGH (ref 70–99)
Potassium: 4.8 mmol/L (ref 3.5–5.1)
Sodium: 134 mmol/L — ABNORMAL LOW (ref 135–145)

## 2023-05-26 LAB — PROTIME-INR
INR: 1.9 — ABNORMAL HIGH (ref 0.8–1.2)
Prothrombin Time: 22.3 s — ABNORMAL HIGH (ref 11.4–15.2)

## 2023-05-26 LAB — CBC
HCT: 38 % — ABNORMAL LOW (ref 39.0–52.0)
Hemoglobin: 11.9 g/dL — ABNORMAL LOW (ref 13.0–17.0)
MCH: 24.9 pg — ABNORMAL LOW (ref 26.0–34.0)
MCHC: 31.3 g/dL (ref 30.0–36.0)
MCV: 79.7 fL — ABNORMAL LOW (ref 80.0–100.0)
Platelets: 203 10*3/uL (ref 150–400)
RBC: 4.77 MIL/uL (ref 4.22–5.81)
RDW: 15.8 % — ABNORMAL HIGH (ref 11.5–15.5)
WBC: 15.6 10*3/uL — ABNORMAL HIGH (ref 4.0–10.5)
nRBC: 0 % (ref 0.0–0.2)

## 2023-05-26 MED ORDER — OXYCODONE-ACETAMINOPHEN 5-325 MG PO TABS
1.0000 | ORAL_TABLET | Freq: Four times a day (QID) | ORAL | 0 refills | Status: AC | PRN
  Filled 2023-05-26: qty 12, 3d supply, fill #0

## 2023-05-26 MED ORDER — WARFARIN SODIUM 2 MG PO TABS
ORAL_TABLET | ORAL | 6 refills | Status: DC
  Filled 2023-05-26: qty 30, 30d supply, fill #0

## 2023-05-26 MED ORDER — WARFARIN SODIUM 1 MG PO TABS
1.0000 mg | ORAL_TABLET | ORAL | Status: DC
Start: 2023-05-27 — End: 2023-05-26

## 2023-05-26 MED ORDER — WARFARIN SODIUM 2 MG PO TABS: 2.0000 mg | ORAL_TABLET | ORAL | Status: DC

## 2023-05-26 NOTE — Progress Notes (Addendum)
Advanced Heart Failure Rounding Note  Cardiologist: Arvilla Meres, MD  Chief Complaint: s/p ICD lead exchange Subjective:    Doppler 71  Feeling well this morning. Lying in bed. No SOB, CP or swelling.  Objective:    Weight Range: 80.1 kg Body mass index is 29.39 kg/m.   Vital Signs:   Temp:  [97.7 F (36.5 C)-98.2 F (36.8 C)] 98 F (36.7 C) (01/31 0448) Pulse Rate:  [30-83] 30 (01/31 0448) Resp:  [12-20] 14 (01/31 0500) BP: (98-119)/(43-91) 104/43 (01/31 0448) SpO2:  [86 %-97 %] 95 % (01/31 0448) Weight:  [80.1 kg] 80.1 kg (01/31 0448) Last BM Date : 05/24/23  Weight change: Filed Weights   05/24/23 1133 05/25/23 0404 05/26/23 0448  Weight: 79.4 kg 77.5 kg 80.1 kg   Intake/Output:   Intake/Output Summary (Last 24 hours) at 05/26/2023 0714 Last data filed at 05/26/2023 0451 Gross per 24 hour  Intake 716 ml  Output 550 ml  Net 166 ml    Physical Exam    General: Well appearing. No distress on RA Cardiac: Mechanical heart sounds with LVAD hum present.  Driveline: Dressing C/D/I. No drainage or redness. Anchor in place. Extremities: Warm and dry. No rash, cyanosis, or edema. R shoulder dressing and sling in place Neuro: Alert and oriented x3. Affect pleasant. Moves all extremities without difficulty.  LVAD Interrogation HM III: Speed: 5400 Flow: 4.5 PI: 3.6 Power: 4.0. No PI events   Telemetry   SR in 60s (personally reviewed)  EKG    No new EKG to review  Labs    CBC Recent Labs    05/24/23 1834 05/25/23 0405 05/26/23 0222  WBC 12.4* 10.0 15.6*  NEUTROABS 11.8*  --   --   HGB 13.6 13.0 11.9*  HCT 42.8 41.4 38.0*  MCV 78.8* 79.8* 79.7*  PLT 196 209 203   Basic Metabolic Panel Recent Labs    16/10/96 1834 05/25/23 0405 05/26/23 0222  NA 136 134* 134*  K 4.1 4.5 4.8  CL 106 104 102  CO2 20* 18* 23  GLUCOSE 137* 180* 116*  BUN 16 18 19   CREATININE 1.66* 1.75* 1.73*  CALCIUM 8.5* 8.2* 8.5*  MG 1.7  --   --    Liver Function  Tests Recent Labs    05/24/23 1834  AST 24  ALT 18  ALKPHOS 100  BILITOT 0.7  PROT 7.9  ALBUMIN 2.9*   No results for input(s): "LIPASE", "AMYLASE" in the last 72 hours. Cardiac Enzymes No results for input(s): "CKTOTAL", "CKMB", "CKMBINDEX", "TROPONINI" in the last 72 hours.  BNP: BNP (last 3 results) No results for input(s): "BNP" in the last 8760 hours.  ProBNP (last 3 results) No results for input(s): "PROBNP" in the last 8760 hours.   D-Dimer No results for input(s): "DDIMER" in the last 72 hours. Hemoglobin A1C No results for input(s): "HGBA1C" in the last 72 hours. Fasting Lipid Panel No results for input(s): "CHOL", "HDL", "LDLCALC", "TRIG", "CHOLHDL", "LDLDIRECT" in the last 72 hours. Thyroid Function Tests No results for input(s): "TSH", "T4TOTAL", "T3FREE", "THYROIDAB" in the last 72 hours.  Invalid input(s): "FREET3"  Other results:   Imaging    No results found.  Medications:    Scheduled Medications:  sodium chloride   Intravenous Once   sodium chloride   Intravenous Once   amiodarone  200 mg Oral Daily   amLODipine  5 mg Oral Daily   atorvastatin  40 mg Oral Daily   losartan  50  mg Oral BID   magnesium oxide  400 mg Oral BID   pantoprazole  40 mg Oral Daily   tamsulosin  0.4 mg Oral Daily   traZODone  50 mg Oral QHS   warfarin  2 mg Oral Once per day on Monday Wednesday Friday   Warfarin - Pharmacist Dosing Inpatient   Does not apply q1600    Infusions:   PRN Medications: acetaminophen, naphazoline-glycerin, ondansetron (ZOFRAN) IV, oxyCODONE-acetaminophen, traMADol  Patient Profile   David [name] is a 58 y.o. male with history of CAD (09 PCI to LAD, mRCA, '10 PCI to Lcx and '12 mRCA), HFrEF (EF ~20% for > 5 years)/ICM s/p HM III LVAD 12/19, VT with prior ICD shocks, HLD and HTN.    Admitted for ICD lead extraction and reimplantation d/t lead failure.  Assessment/Plan   ICD lead exchange - presented today for lead  exchange - small chest hematoma, remained overnight for observation on warfarin - warfarin resumed overnight. Dosing d/w PharmD   Chronic systolic heart failure s/p HM III LVAD implanted 04/21/27 - Doing well NYHA I with VAD support - post-op CXR 1/30 VAD stable, no pulm edema - volume status ok - continue losartan 50 mg bid - continue norvasc 5 mg daily - will continue to have conversations with him and his wife regarding whether or not he will reconsider transplant evaluation after recent VT in clinic (he has deferred in past)   3.  Ventricular tachycardia - Had ICD shock x 2 for VT on 03/23/23 (device reprogrammed at the time) - No recurrence. - Continue amiodarone 200mg  daily.  - Lead exchange as in #1   3. PAF/AFL - had DC-CV on 10/17/18 with subsequent short episodes of AF on amio 100 daily - Had recurrent AF in 10/22. Converted with increasing amio - continue amio 200 mg daily. Warfarin as in #1   4. SAH - admit in 2/23 with [facility] requiring ventriculostomy - much improved. HAs resolved.  - Continue to follow MAPs closely - No significant residual deficits   5. h/o UGI AVM bleed - s/p APC x 4 lesions in 2/19 - No recurrent bleeding - Continue PPI - Off ASA due to GIB.    6. CKD Stage IIIa:  - Creatinine baseline 1.3 - 1.7, stable 1.73 today - Continue to follow   7. VAD management - LDH stable - INR goal 1.5-2.0 with h/o SAH. Off ASA  - INR 1.9 today. Warfarin resumed overnight. Dosing d/w PharmD - Dopplers ok, DL ok   8. CAD:  s/p PCI RCA/PLOM with DES x2 and DES to mild LAD 1/18 - No s/s angina - Continue lipitor 40 mg daily. Off ASA due to GIB.   9. HTN - needs tight BP control with h/o SAH - continue losartan + norvasc as above - Dopplers ok   10. [employer] fan - unfortunately this has been incurable even with VAD implant - if goes to [facility] for transplant evaluation he is willing to route for [facility]  I reviewed the LVAD parameters from today, and  compared the results to the patient's prior recorded data.  No programming changes were made.  The LVAD is functioning within specified parameters.  The patient performs LVAD self-test daily.  LVAD interrogation was negative for any significant power changes, alarms or PI events/speed drops.  LVAD equipment check completed and is in good working order.  Back-up equipment present.   LVAD education done on emergency procedures and precautions and reviewed exit  site care.  Length of Stay: 1  [place] Lee, NP  05/26/2023, 7:14 AM  Advanced Heart Failure Team Pager [phone] (M-F; 7a - 5p)  Please contact [facility] for night-coverage after hours (5p -7a ) and weekends on [email or web address]  VAD Team Pager [phone] (7am - 7am)   Patient seen and examined with the above-signed Advanced Practice Provider and/or Housestaff. I personally reviewed laboratory data, imaging studies and relevant notes. I independently examined the patient and formulated the important aspects of the plan. I have edited the note to reflect any of my changes or salient points. I have personally discussed the plan with the patient and/or family.  Developed pacer pocket hematoma. Pressure dressing in place. Mildly sore but pain now controlled. INR 1.9 today. Hgb 13.0 -> 11.9   Denies SOB.   VAD interrogated personally. Parameters stable.  General:  NAD.  HEENT: normal  Neck: supple. JVP not elevated.  Carotids 2+ bilat; no bruits. No lymphadenopathy or thryomegaly appreciated. Cor: LVAD hum.  Pacer site with pressure dressing in place Lungs: Clear. Abdomen: soft, nontender, non-distended. No hepatosplenomegaly. No bruits or masses. Good bowel sounds. Driveline site clean. Anchor in place.  Extremities: no cyanosis, clubbing, rash. Warm no edema  Neuro: alert & oriented x 3. No focal deficits. Moves all 4 without problem   Case d/w EP and HFPharmD. Hematoma is stable. Will hold warfarin today and run INR closer to 1.5 for a few  days. Has wound check on Monday.   VAD interrogated personally. Parameters stable.  HF stable.  OK for d/c today. With close f/u as above.   My time for d/c 40 mintues.   Arvilla Meres, MD  8:56 AM

## 2023-05-26 NOTE — Progress Notes (Signed)
PHARMACY - ANTICOAGULATION CONSULT NOTE  Pharmacy Consult for Warfarin Indication:  LVAD  Allergies  Allergen Reactions   Plavix [Clopidogrel Bisulfate] Hives    Patient Measurements: Height: 5\' 5"  (165.1 cm) Weight: 80.1 kg (176 lb 9.4 oz) IBW/kg (Calculated) : 61.5  Vital Signs: Temp: 98.2 F (36.8 C) (01/31 0803) Temp Source: Oral (01/31 0803) BP: 101/66 (01/31 0843) Pulse Rate: 30 (01/31 0448)  Labs: Recent Labs    05/24/23 1834 05/25/23 0405 05/26/23 0222  HGB 13.6 13.0 11.9*  HCT 42.8 41.4 38.0*  PLT 196 209 203  LABPROT 17.1* 18.3* 22.3*  INR 1.4* 1.5* 1.9*  CREATININE 1.66* 1.75* 1.73*    Estimated Creatinine Clearance: 44.8 mL/min (A) (by C-G formula based on SCr of 1.73 mg/dL (H)).   Medical History: Past Medical History:  Diagnosis Date   AICD (automatic cardioverter/defibrillator) present    Anemia    Anxiety    CAD (coronary artery disease) 2009   AMI in 12/2007 with PCI to LAD, staged PCI to  mid/distal RCA, NSTEMI in 02/2009 with BMS to LCx   CHF (congestive heart failure) (HCC)    Chronic kidney disease 11/03/2016   stage 3 kidney disease   Dysrhythmia    "arrythmia problems at some point", "fatal rhythms"   History of blood transfusion    "I was bleeding from was rectum" (04/19/2017)   HLD (hyperlipidemia)    HTN (hypertension)    Hyperlipidemia 03/15/2011   Ischemic cardiomyopathy    Admitted in 07/2010 with CHF exacerbation   Myocardial infarction (HCC)    "I've had 4" (04/19/2017)   Pneumonia 2018 X 1   Seizures (HCC)    one seizure in 04/2016 during cardiac event (04/19/2017)   STEMI 2009 (anterior), 2010 (lateral), and 2012 (inferior) 03/14/2011    Medications:  Medications Prior to Admission  Medication Sig Dispense Refill Last Dose/Taking   amiodarone (PACERONE) 200 MG tablet Take 1 tablet (200 mg total) by mouth daily. 90 tablet 3 05/23/2023   amLODipine (NORVASC) 5 MG tablet Take 1 tablet (5 mg total) by mouth daily. 90  tablet 3 05/23/2023   atorvastatin (LIPITOR) 40 MG tablet Take 1 tablet (40 mg total) by mouth daily. 90 tablet 3 05/23/2023   Blood Pressure Monitoring (BLOOD PRESSURE CUFF) MISC 1 Device by Does not apply route daily. 1 each 0 Taking   fluticasone (FLONASE) 50 MCG/ACT nasal spray Place 2 sprays into both nostrils daily. (Patient taking differently: Place 2 sprays into both nostrils daily as needed for allergies or rhinitis.) 9.9 mL 3 Past Week   losartan (COZAAR) 50 MG tablet Take 2 tablets (100 mg total) by mouth daily. (Patient taking differently: Take 50 mg by mouth 2 (two) times daily.) 180 tablet 3 05/23/2023   magnesium oxide (MAG-OX) 400 (240 Mg) MG tablet Take 1 tablet (400 mg total) by mouth 2 (two) times daily. 180 tablet 3 05/23/2023   Multiple Vitamin (MULTIVITAMIN WITH MINERALS) TABS tablet Take 1 tablet by mouth daily. 90 tablet 3 05/23/2023   pantoprazole (PROTONIX) 40 MG tablet Take 1 tablet (40 mg total) by mouth daily. 90 tablet 3 05/23/2023   polyethylene glycol (MIRALAX / GLYCOLAX) 17 g packet Take 17 g by mouth daily. (Patient taking differently: Take 17 g by mouth daily as needed for mild constipation or moderate constipation.) 30 each 0 Past Week   senna (SENOKOT) 8.6 MG TABS tablet Take 2 tablets (17.2 mg total) by mouth at bedtime. (Patient taking differently: Take 2 tablets by mouth  daily as needed for mild constipation or moderate constipation.) 120 tablet 0 Past Month   tamsulosin (FLOMAX) 0.4 MG CAPS capsule Take 1 capsule (0.4 mg total) by mouth daily. 90 capsule 3 05/23/2023   traZODone (DESYREL) 50 MG tablet Take 1 tablet (50 mg total) by mouth at bedtime. 90 tablet 3 05/23/2023   warfarin (COUMADIN) 2 MG tablet TAKE 2 MG (1 TABLET) EVERY MON, WED, AND FRIDAY, AND TAKE 1 MG (1/2 TABLET) ALL OTHER DAYS OR AS DIRECTED BY HF CLINIC 30 tablet 6 05/23/2023   acetaminophen (TYLENOL) 325 MG tablet Take 2 tablets (650 mg total) by mouth every 4 (four) hours as needed for headache or  mild pain.   More than a month    Assessment: 58 y.o. presented for ICD lead extraction and replacement - done 1/29. Pt on warfarin PTA for LVAD. Admission INR 1.4. CBC ok on admission.  Home dose: 2mg  M/W/F and 1mg  all other days - last dose 1/28 pta.  INR goal 1.5-2 with h/o SAH  INR up to 1.9 today  Goal of Therapy:  INR 1.5-2 Monitor platelets by anticoagulation protocol: Yes   Plan:  Per discussion with EP - would like INR to rise more slowly - pharmacy asked to hold dose tonight.    Then can resume Coumadin at PTA dose: warfarin 1 mg daily except 2 mg on MWF starting 2/1. Daily INR  Reece Leader, Colon Flattery, [facility] Clinical Pharmacist  05/26/2023 8:47 AM   [facility] pharmacy phone numbers are listed on [email or web address]

## 2023-05-26 NOTE — Progress Notes (Signed)
LVAD Coordinator Rounding Note:  Admitted 05/24/23 due to lead extraction/exchange with Dr Lalla Brothers.   HM3 LVAD implanted on 04/20/17 by Dr Laneta Simmers under destination therapy criteria.  Pt laying in bed on my arrival. Denies complaints other than minor chest soreness. Looking forward to going home today. Pt's wife aware of discharge plan.    Large pressure dressing on left chest- small hematoma noted per Dr Lalla Brothers. Stable for discharge today. Has wound checks/follow up appts scheduled 05/29/23 and 06/09/23 at the device clinic.   Vital signs: HR: 70 Doppler Pressure: 80 Arterial BP: 98/64 (76) O2 Sat: 97% on RA Wt: 170.8>176.5 lbs    LVAD interrogation reveals:  Speed: 5400 Flow: 4.8 Power: 4.0 w PI: 3.7   Alarms: none Events:  rare Hematocrit: 41  Fixed speed: 5400 Low speed limit: 5100  Drive Line: Existing dressing clean, dry, and intact. Anchor correctly applied. Weekly dressing maintained by pt's wife at home. Weekly dressing changes using weekly kit by bedside RN. Next dressing change due: 05/30/23.  Labs:  LDH trend: 191>178  INR trend: 1.5>1.9  Anticoagulation Plan: -INR Goal: 1.5-2.0 -ASA Dose: none  Device: -Medtronic BiV -Therapies: ON  Arrythmias: Hx Afib/AF  Plan/Recommendations:  1. Page VAD coordinator with any drive line or equipment concerns 2. Weekly drive line dressing changes by bedside RN  Alyce Pagan RN VAD Coordinator  Office: [phone]  24/7 Pager: [phone]

## 2023-05-26 NOTE — TOC Transition Note (Signed)
Transition of Care [facility]) - Discharge Note   Patient Details  Name: David [name] MRN: [id number] Date of Birth: [birth year]  Transition of Care [facility]) CM/SW Contact:  Elliot Cousin, RN Phone Number: [phone] 05/26/2023, 12:54 PM   Clinical Narrative:    HF TOC CM spoke to pt and wife at bedside. Pt and wife reviewed Medicare Code 44 together, signed and CM placed on chart. Wife will provide transportation to home.    Final next level of care: Home/Self Care Barriers to Discharge: No Barriers Identified   Patient Goals and CMS Choice Patient states their goals for this hospitalization and ongoing recovery are:: wants to go home          Discharge Placement                       Discharge Plan and Services Additional resources added to the After Visit Summary for     Discharge Planning Services: CM Consult                                 Social Drivers of Health (SDOH) Interventions SDOH Screenings   Food Insecurity: Food Insecurity Present (05/24/2023)  Housing: Low Risk  (05/24/2023)  Transportation Needs: No Transportation Needs (05/24/2023)  Utilities: At Risk (05/24/2023)  Depression (PHQ2-9): High Risk (07/19/2021)  Financial Resource Strain: High Risk (07/04/2022)  Physical Activity: Inactive (03/10/2017)  Stress: No Stress Concern Present (03/10/2017)  Tobacco Use: Medium Risk (05/22/2023)     Readmission Risk Interventions     No data to display

## 2023-05-26 NOTE — Progress Notes (Signed)
AVS gone over with patient/wife, all questions answered, patient belongings gathered, ivs removed, TOC meds given and patient was wheeled out to family vehicle.

## 2023-05-26 NOTE — Discharge Instructions (Addendum)
After Your ICD (Implantable Cardiac Defibrillator)   You have a Medtronic ICD  ACTIVITY Do not lift your arm above shoulder height for 1 week after your procedure. After 7 days, you may progress as below.  You should remove your sling 24 hours after your procedure, unless otherwise instructed by your provider.     Friday June 02, 2023  Saturday June 03, 2023 Sunday June 04, 2023 Monday June 05, 2023   Do not lift, push, pull, or carry anything over 10 pounds with the affected arm until 6 weeks (Friday July 07, 2023 ) after your procedure.   You may drive AFTER your wound check, unless you have been told otherwise by your provider.   Ask your healthcare provider when you can go back to work   INCISION/Dressing Coumadin instructions will be given by Dr. [name] for discharge.   Leave your pressure dressing in place until seen in Device Clinic on 05/29/23.  Do not remove steri-strips or glue as below.   Monitor your defibrillator site for redness, swelling, and drainage. Call the device clinic at [phone] if you experience these symptoms or fever/chills.  If your incision is sealed with Steri-strips or staples, you may shower 7 days after your procedure or when told by your provider. Do not remove the steri-strips or let the shower hit directly on your site. You may wash around your site with soap and water.    If you were discharged in a sling, please do not wear this during the day more than 48 hours after your surgery unless otherwise instructed. This may increase the risk of stiffness and soreness in your shoulder.   Avoid lotions, ointments, or perfumes over your incision until it is well-healed.  You may use a hot tub or a pool AFTER your wound check appointment if the incision is completely closed.  Your ICD is designed to protect you from life threatening heart rhythms. Because of this, you may receive a shock.   1 shock with no symptoms:  Call the office  during business hours. 1 shock with symptoms (chest pain, chest pressure, dizziness, lightheadedness, shortness of breath, overall feeling unwell):  Call 911. If you experience 2 or more shocks in 24 hours:  Call 911. If you receive a shock, you should not drive for 6 months per the [place] DMV IF you receive appropriate therapy from your ICD.   ICD Alerts:  Some alerts are vibratory and others beep. These are NOT emergencies. Please call our office to let us know. If this occurs at night or on weekends, it can wait until the next business day. Send a remote transmission.  If your device is capable of reading fluid status (for heart failure), you will be offered monthly monitoring to review this with you.   DEVICE MANAGEMENT Remote monitoring is used to monitor your ICD from home. This monitoring is scheduled every 91 days by our office. It allows [place] to keep an eye on the functioning of your device to ensure it is working properly. You will routinely see your Electrophysiologist annually (more often if necessary).   You should receive your ID card for your new device in 4-8 weeks. Keep this card with you at all times once received. Consider wearing a medical alert bracelet or necklace.  Your ICD  may be MRI compatible. This will be discussed at your next office visit/wound check.  You should avoid contact with strong electric or magnetic fields.   Do not use amateur (  ham) radio equipment or electric (arc) welding torches. MP3 player headphones with magnets should not be used. Some devices are safe to use if held at least 12 inches (30 cm) from your defibrillator. These include power tools, lawn mowers, and speakers. If you are unsure if something is safe to use, ask your health care provider.  When using your cell phone, hold it to the ear that is on the opposite side from the defibrillator. Do not leave your cell phone in a pocket over the defibrillator.  You may safely use electric blankets, heating  pads, computers, and microwave ovens.  Call the office right away if: You have chest pain. You feel more than one shock. You feel more short of breath than you have felt before. You feel more light-headed than you have felt before. Your incision starts to open up.  This information is not intended to replace advice given to you by your health care provider. Make sure you discuss any questions you have with your health care provider.

## 2023-05-26 NOTE — TOC Progression Note (Signed)
Transition of Care [facility]) - Progression Note    Patient Details  Name: David [name] MRN: [id number] Date of Birth: [birth year]  Transition of [facility] CM/SW Contact  Marliss Coots, LCSW Phone Number: 05/26/2023, 11:32 AM  Clinical Narrative:     11:32 AM CSW introduced herself and role to patient at bedside. Patient's spouse, Vikki Ports, was also present at bedside. Patient consented CSW to speak in front of Vikki Ports. CSW followed up with patient and Vikki Ports on SDOH needs (food and utilities). Patient and Vikki Ports expressed interest in food and utility assistance resources in [place]. Patient and Vikki Ports informed CSW that they receive $23 a month in food stamps. CSW provided patient and Vikki Ports with food resources ([employer]), and utility assistance resources from [place] [employer].  Expected Discharge Plan: Home/Self Care Barriers to Discharge: Continued Medical Work up  Expected Discharge Plan and Services   Discharge Planning Services: CM Consult   Living arrangements for the past 2 months: Single Family Home                                       Social Determinants of Health (SDOH) Interventions SDOH Screenings   Food Insecurity: Food Insecurity Present (05/24/2023)  Housing: Low Risk  (05/24/2023)  Transportation Needs: No Transportation Needs (05/24/2023)  Utilities: At Risk (05/24/2023)  Depression (PHQ2-9): High Risk (07/19/2021)  Financial Resource Strain: High Risk (07/04/2022)  Physical Activity: Inactive (03/10/2017)  Stress: No Stress Concern Present (03/10/2017)  Tobacco Use: Medium Risk (05/22/2023)    Readmission Risk Interventions     No data to display

## 2023-05-26 NOTE — Progress Notes (Signed)
  Patient Name: David [name] Date of Encounter: 05/26/2023  Primary Cardiologist: Arvilla Meres, MD Electrophysiologist: Sherryl Manges, MD  Interval Summary   The patient is doing well today. He is hopeful to go home. At this time, the patient denies chest pain, shortness of breath, or any new concerns.  Vital Signs    Vitals:   05/26/23 0448 05/26/23 0500 05/26/23 0803 05/26/23 0843  BP: (!) 104/43  101/66 101/66  Pulse: (!) 30     Resp: 12 14 16    Temp: 98 F (36.7 C)  98.2 F (36.8 C)   TempSrc: Oral  Oral   SpO2: 95%  97%   Weight: 80.1 kg     Height:        Intake/Output Summary (Last 24 hours) at 05/26/2023 1120 Last data filed at 05/26/2023 0849 Gross per 24 hour  Intake 598 ml  Output 950 ml  Net -352 ml   Filed Weights   05/24/23 1133 05/25/23 0404 05/26/23 0448  Weight: 79.4 kg 77.5 kg 80.1 kg    Physical Exam    GEN- The patient is well appearing, alert and oriented x 3 today.   Lungs- Clear to ausculation bilaterally, normal work of breathing Cardiac- Regular rate and rhythm, no murmurs, rubs or gallops GI- soft, NT, ND, + BS Extremities- no clubbing or cyanosis. No edema  Telemetry    BiV pacing 70-80's with PVC's(personally reviewed)  Hospital Course    David [name] is a 58 y.o. male  admitted for ICD lead failure with planned revision. Hx of ICD and LVAD for end stage cardiomyopathy, ventricular tachycardia with recent appropriate shocks and evidence of recent ICD lead failure.  Patient underwent ICD lead revision on 05/24/2023 per Dr. Lalla Brothers.  In addition to extraction of ICD lead he had removal of epicardial leads x 2, full capsulectomy, and generator replacement.   Assessment & Plan    Chronic systolic heart failure with ICD in situ ICD lead malfunction status post extraction, epicardial lead removal and generator replacement (1/29) Ventricular tachycardia LVAD in situ Secondary hypercoagulable state, on Coumadin -leave pressure  dressing in place for discharge > he is planned for follow up on Monday 2/3 in the Device Clinic for removal of dressing and review of small-mod hematoma  -discussed coumadin with Dr. [name] > planning to run slightly sub-therapeutic, INR ~ 1.5, while his hematoma / pocket stabilizes -arm restrictions / device instructions given to patient and wife -device follow up appts in place in addition to 2/3 visit  For questions or updates, please contact [facility] Please consult [email or web address] for contact info under Cardiology/STEMI.  Signed, Canary Brim, NP-C, AGACNP-BC [facility] - Electrophysiology  05/26/2023, 11:26 AM

## 2023-05-27 LAB — TYPE AND SCREEN
ABO/RH(D): O POS
Antibody Screen: NEGATIVE
Donor AG Type: NEGATIVE
Donor AG Type: NEGATIVE
Donor AG Type: NEGATIVE
Donor AG Type: NEGATIVE
Unit division: 0
Unit division: 0
Unit division: 0
Unit division: 0

## 2023-05-27 LAB — BPAM RBC
Blood Product Expiration Date: 202502242359
Blood Product Expiration Date: 202502242359
Blood Product Expiration Date: 202502242359
Blood Product Expiration Date: 202502242359
ISSUE DATE / TIME: 202501291254
ISSUE DATE / TIME: 202501291254
Unit Type and Rh: 5100
Unit Type and Rh: 5100
Unit Type and Rh: 5100
Unit Type and Rh: 5100

## 2023-05-28 LAB — CUP PACEART INCLINIC DEVICE CHECK
Date Time Interrogation Session: 20250115163808
Implantable Lead Connection Status: 753985
Implantable Lead Connection Status: 753985
Implantable Lead Connection Status: 753985
Implantable Lead Connection Status: 753985
Implantable Lead Implant Date: 20180409
Implantable Lead Implant Date: 20180409
Implantable Lead Implant Date: 20180716
Implantable Lead Implant Date: 20180716
Implantable Lead Location: 753858
Implantable Lead Location: 753858
Implantable Lead Location: 753859
Implantable Lead Location: 753860
Implantable Lead Model: 5076
Implantable Lead Model: 511212
Implantable Lead Model: 511212
Implantable Lead Serial Number: 263773
Implantable Lead Serial Number: 263775
Implantable Pulse Generator Implant Date: 20180409

## 2023-05-29 ENCOUNTER — Ambulatory Visit (HOSPITAL_COMMUNITY): Payer: Self-pay | Admitting: Pharmacist

## 2023-05-29 ENCOUNTER — Ambulatory Visit: Payer: Medicare HMO | Attending: Cardiology

## 2023-05-29 DIAGNOSIS — I255 Ischemic cardiomyopathy: Secondary | ICD-10-CM

## 2023-05-29 LAB — POCT INR: INR: 1.4 — AB (ref 2.0–3.0)

## 2023-05-29 NOTE — Patient Instructions (Signed)
   After Your ICD (Implantable Cardiac Defibrillator)    Monitor your defibrillator site for redness, swelling, and drainage. Call the device clinic at [phone] if you experience these symptoms or fever/chills.   

## 2023-05-29 NOTE — Progress Notes (Signed)
Pressure dressing removed. Dr. Jimmey Ralph evaluated patient and advised to reapply another pressure dressing and recheck Wednesday 05/31/23 @ 10:00 AM for Dr. Lalla Brothers to review.   Most of the site was firm to palpitate although small area was soft to palpitate.   Wound care education provided to patient/family with verbal understanding.

## 2023-05-31 ENCOUNTER — Ambulatory Visit (HOSPITAL_COMMUNITY): Payer: Self-pay | Admitting: Pharmacist

## 2023-05-31 ENCOUNTER — Ambulatory Visit: Payer: Medicare HMO | Attending: Internal Medicine

## 2023-05-31 DIAGNOSIS — I255 Ischemic cardiomyopathy: Secondary | ICD-10-CM

## 2023-05-31 LAB — POCT INR: INR: 1.3 — AB (ref 2.0–3.0)

## 2023-05-31 NOTE — Patient Instructions (Signed)
 Follow up as scheduled.

## 2023-05-31 NOTE — Progress Notes (Signed)
 Pt seen in device clinic for wound recheck post pressure dressing placement.  Pressure dressing removed.  Site evaluated by Dr. Cindie.  Per Dr. Louellen is much improved.  Area around device is now firm to touch.  No active bleeding noted.   Pt and wife advised to continue to monitor site and contact device clinic if any swelling, new bruising, or s/s of infection noted.  Gave Pt direct number to device clinic.  Additionally, Pt may continue to gradually increase his INR to his goal of 1.5-2.  Message was sent to Tinnie Redman [facility] per request of Pt's wife.  Pt's wife to send INR reading today and Dr. Cindie in agreement.    Pt will follow up as scheduled and call sooner if any changes to device site.

## 2023-06-01 ENCOUNTER — Ambulatory Visit: Payer: Medicare HMO

## 2023-06-05 ENCOUNTER — Ambulatory Visit (HOSPITAL_COMMUNITY): Payer: Self-pay | Admitting: Pharmacist

## 2023-06-05 LAB — POCT INR: INR: 1.7 — AB (ref 2.0–3.0)

## 2023-06-06 NOTE — Progress Notes (Signed)
Remote ICD transmission.

## 2023-06-08 ENCOUNTER — Encounter (HOSPITAL_COMMUNITY): Payer: Self-pay

## 2023-06-08 ENCOUNTER — Telehealth (HOSPITAL_COMMUNITY): Payer: Self-pay | Admitting: Licensed Clinical Social Worker

## 2023-06-08 NOTE — Telephone Encounter (Signed)
Patient's wife called to request letter for Social Services for financial assistance for recent power bill. CSW discussed applying for [employer] program which will also assist with power bill. She is aware of the program and has assistance in the past but did not renew this year. Wife plans to go to Social Services on Monday and will stop by the clinic to pick up letter tomorrow morning. CSW continues to be available as needed. Lasandra Beech, LCSW, CCSW-MCS [phone]

## 2023-06-08 NOTE — Progress Notes (Unsigned)
Wound / Device Check Visit   Patient Profile:  58 y/o M with hx of HFrEF / ICM s/p ICD, paroxysmal AF, LVAD, chronic coumadin therapy who underwent ICD lead extraction, epicardial lead removal x2, generator replacement and pocket capsulectomy on 05/24/23 per Dr. Due to ICD lead failure.   Device Hx:  Medtronic dual chamber ICD implanted 08/01/2016, RV lead revision & generator change 05/24/23    Exam:  Awake / alert, NAD Device site cleaned, steri strips removed, no edema, hematoma, erythema. Incision edges well  / healing well  INR 06/05/23 > 1.7  Device Check > within normal limits, see PaceArt for details   Ongoing arm restrictions reviewed with patient / caregiver ***.   Canary Brim, NP-C, AGACNP-BC [facility] - Electrophysiology  06/08/2023, 9:11 PM

## 2023-06-09 ENCOUNTER — Encounter: Payer: Self-pay | Admitting: Pulmonary Disease

## 2023-06-09 ENCOUNTER — Ambulatory Visit: Payer: Medicare HMO | Attending: Pulmonary Disease | Admitting: Pulmonary Disease

## 2023-06-09 VITALS — BP 110/70 | HR 83 | Ht 65.0 in | Wt 175.6 lb

## 2023-06-09 DIAGNOSIS — I255 Ischemic cardiomyopathy: Secondary | ICD-10-CM | POA: Diagnosis not present

## 2023-06-09 DIAGNOSIS — Z95810 Presence of automatic (implantable) cardiac defibrillator: Secondary | ICD-10-CM

## 2023-06-09 LAB — CUP PACEART INCLINIC DEVICE CHECK
Date Time Interrogation Session: 20250214130233
Implantable Lead Connection Status: 753985
Implantable Lead Connection Status: 753985
Implantable Lead Connection Status: 753985
Implantable Lead Connection Status: 753985
Implantable Lead Implant Date: 20180409
Implantable Lead Implant Date: 20180409
Implantable Lead Implant Date: 20180716
Implantable Lead Implant Date: 20180716
Implantable Lead Location: 753858
Implantable Lead Location: 753858
Implantable Lead Location: 753859
Implantable Lead Location: 753860
Implantable Lead Model: 5076
Implantable Lead Model: 511212
Implantable Lead Model: 511212
Implantable Lead Serial Number: 263773
Implantable Lead Serial Number: 263775
Implantable Pulse Generator Implant Date: 20180409

## 2023-06-09 NOTE — Patient Instructions (Signed)
Medication Instructions:   Your physician recommends that you continue on your current medications as directed. Please refer to the Current Medication list given to you today.  *If you need a refill on your cardiac medications before your next appointment, please call your pharmacy*   Lab Work:  NONE ORDERED  TODAY    If you have labs (blood work) drawn today and your tests are completely normal, you will receive your results only by: MyChart Message (if you have MyChart) OR A paper copy in the mail If you have any lab test that is abnormal or we need to change your treatment, we will call you to review the results.   Testing/Procedures:  NONE ORDERED  TODAY    Follow-Up: At [facility], you and your health needs are our priority.  As part of our continuing mission to provide you with exceptional heart care, we have created designated Provider Care Teams.  These Care Teams include your primary Cardiologist (physician) and Advanced Practice Providers (APPs -  Physician Assistants and Nurse Practitioners) who all work together to provide you with the care you need, when you need it.  We recommend signing up for the patient portal called "MyChart".  Sign up information is provided on this After Visit Summary.  MyChart is used to connect with patients for Virtual Visits (Telemedicine).  Patients are able to view lab/test results, encounter notes, upcoming appointments, etc.  Non-urgent messages can be sent to your provider as well.   To learn more about what you can do with MyChart, go to [email or web address].    Your next appointment:  AS SCHEDULED    Other Instructions       1st Floor: - Lobby - Registration  - Pharmacy  - Lab - Cafe  2nd Floor: - PV Lab - Diagnostic Testing (echo, CT, nuclear med)  3rd Floor: - Vacant  4th Floor: - TCTS (cardiothoracic surgery) - AFib Clinic - Structural Heart Clinic - Vascular Surgery  - Vascular Ultrasound  5th  Floor: - HeartCare Cardiology (general and EP) - Clinical Pharmacy for coumadin, hypertension, lipid, weight-loss medications, and med management appointments    Valet parking services will be available as well.

## 2023-06-12 ENCOUNTER — Ambulatory Visit (HOSPITAL_COMMUNITY): Payer: Self-pay | Admitting: Pharmacist

## 2023-06-12 LAB — POCT INR: INR: 2 (ref 2.0–3.0)

## 2023-06-19 ENCOUNTER — Ambulatory Visit (HOSPITAL_COMMUNITY): Payer: Self-pay | Admitting: Pharmacist

## 2023-06-19 LAB — POCT INR: INR: 2 (ref 2.0–3.0)

## 2023-06-26 ENCOUNTER — Ambulatory Visit (HOSPITAL_COMMUNITY): Payer: Self-pay | Admitting: Pharmacist

## 2023-06-26 LAB — POCT INR: INR: 2.5 (ref 2.0–3.0)

## 2023-06-29 ENCOUNTER — Ambulatory Visit
Admission: RE | Admit: 2023-06-29 | Discharge: 2023-06-29 | Disposition: A | Discharge status: Home / Self Care | Payer: Medicare HMO | Source: Ambulatory Visit | Attending: Internal Medicine | Admitting: Internal Medicine

## 2023-06-29 ENCOUNTER — Ambulatory Visit (INDEPENDENT_AMBULATORY_CARE_PROVIDER_SITE_OTHER): Payer: Medicare HMO

## 2023-06-29 ENCOUNTER — Encounter (HOSPITAL_COMMUNITY): Payer: Self-pay | Admitting: Internal Medicine

## 2023-06-29 VITALS — BP 99/65 | Ht 65.0 in | Wt 176.4 lb

## 2023-06-29 DIAGNOSIS — I2510 Atherosclerotic heart disease of native coronary artery without angina pectoris: Secondary | ICD-10-CM | POA: Insufficient documentation

## 2023-06-29 DIAGNOSIS — Z95811 Presence of heart assist device: Secondary | ICD-10-CM

## 2023-06-29 DIAGNOSIS — I472 Ventricular tachycardia, unspecified: Secondary | ICD-10-CM | POA: Diagnosis not present

## 2023-06-29 DIAGNOSIS — Z8673 Personal history of transient ischemic attack (TIA), and cerebral infarction without residual deficits: Secondary | ICD-10-CM | POA: Diagnosis not present

## 2023-06-29 DIAGNOSIS — Z4509 Encounter for adjustment and management of other cardiac device: Secondary | ICD-10-CM | POA: Insufficient documentation

## 2023-06-29 DIAGNOSIS — I10 Essential (primary) hypertension: Secondary | ICD-10-CM

## 2023-06-29 DIAGNOSIS — N1831 Chronic kidney disease, stage 3a: Secondary | ICD-10-CM | POA: Diagnosis not present

## 2023-06-29 DIAGNOSIS — I4892 Unspecified atrial flutter: Secondary | ICD-10-CM | POA: Diagnosis not present

## 2023-06-29 DIAGNOSIS — E785 Hyperlipidemia, unspecified: Secondary | ICD-10-CM | POA: Insufficient documentation

## 2023-06-29 DIAGNOSIS — Z7901 Long term (current) use of anticoagulants: Secondary | ICD-10-CM | POA: Diagnosis not present

## 2023-06-29 DIAGNOSIS — Z95810 Presence of automatic (implantable) cardiac defibrillator: Secondary | ICD-10-CM | POA: Insufficient documentation

## 2023-06-29 DIAGNOSIS — Z79899 Other long term (current) drug therapy: Secondary | ICD-10-CM

## 2023-06-29 DIAGNOSIS — I480 Paroxysmal atrial fibrillation: Secondary | ICD-10-CM | POA: Diagnosis not present

## 2023-06-29 DIAGNOSIS — I5022 Chronic systolic (congestive) heart failure: Secondary | ICD-10-CM | POA: Diagnosis not present

## 2023-06-29 DIAGNOSIS — I130 Hypertensive heart and chronic kidney disease with heart failure and stage 1 through stage 4 chronic kidney disease, or unspecified chronic kidney disease: Secondary | ICD-10-CM | POA: Diagnosis not present

## 2023-06-29 DIAGNOSIS — I255 Ischemic cardiomyopathy: Secondary | ICD-10-CM

## 2023-06-29 LAB — CBC
HCT: 41.7 % (ref 39.0–52.0)
Hemoglobin: 13.3 g/dL (ref 13.0–17.0)
MCH: 25.1 pg — ABNORMAL LOW (ref 26.0–34.0)
MCHC: 31.9 g/dL (ref 30.0–36.0)
MCV: 78.8 fL — ABNORMAL LOW (ref 80.0–100.0)
Platelets: 245 10*3/uL (ref 150–400)
RBC: 5.29 MIL/uL (ref 4.22–5.81)
RDW: 15.9 % — ABNORMAL HIGH (ref 11.5–15.5)
WBC: 9.7 10*3/uL (ref 4.0–10.5)
nRBC: 0 % (ref 0.0–0.2)

## 2023-06-29 LAB — LACTATE DEHYDROGENASE: LDH: 245 U/L — ABNORMAL HIGH (ref 98–192)

## 2023-06-29 LAB — BASIC METABOLIC PANEL
Anion gap: 6 (ref 5–15)
BUN: 13 mg/dL (ref 6–20)
CO2: 23 mmol/L (ref 22–32)
Calcium: 8.8 mg/dL — ABNORMAL LOW (ref 8.9–10.3)
Chloride: 110 mmol/L (ref 98–111)
Creatinine, Ser: 1.86 mg/dL — ABNORMAL HIGH (ref 0.61–1.24)
GFR, Estimated: 41 mL/min — ABNORMAL LOW (ref >60)
Glucose, Bld: 85 mg/dL (ref 70–99)
Potassium: 3.8 mmol/L (ref 3.5–5.1)
Sodium: 139 mmol/L (ref 135–145)

## 2023-06-29 LAB — PROTIME-INR
INR: 1.5 — ABNORMAL HIGH (ref 0.8–1.2)
Prothrombin Time: 18.6 s — ABNORMAL HIGH (ref 11.4–15.2)

## 2023-06-29 NOTE — Patient Instructions (Signed)
   After Your ICD (Implantable Cardiac Defibrillator)    Monitor your defibrillator site for redness, swelling, and drainage. Call the device clinic at [phone] if you experience these symptoms or fever/chills.   

## 2023-06-29 NOTE — Progress Notes (Signed)
 Patient presents for 2 month f/u in VAD Clinic today with David [name]) wife. Denies issues with VAD equipment or drive line.   Denies lightheadedness, dizziness, falls, heart failure symptoms, headaches, and signs of bleeding. Reports mild shortness of breath with exertion. He admits he is mostly sedentary, but has been trying to be more active.   Pt had recent admission for a lead extraction with Dr.Lambert 05/17/23. Last visit with EP today. Wife states wound is healing well.  Dr [name] discussed transplant evaluation with pt and David [name] today. Pt shared with VAD coordinator that he has thought about it and has decided not to move forward with transplant evaluation at this time.   Vital Signs: Doppler Pressure: 94 Automatic BP: 99/65 (76) HR: 54 NSR  SPO2: 95% RA   Weight: 176.4 lb w/ eqt Last weight: 176.8 lb w/ eqt  VAD Indication: Destination therapy- CKD   VAD interrogation & Equipment Management: Speed: 5400 Flow: 4.8 Power: 4.0 w    PI: 3.6   Alarms: none Events: 0 - 10 daily  Fixed speed 5400 Low speed limit: 5100   Primary Controller:  Replace back up battery in 8 months Back up controller:  Replace back up battery in 6 months   Annual Equipment Maintenance on UBC/PM was performed on 03/27/2023.   I reviewed the LVAD parameters from today and compared the results to the patient's prior recorded data. LVAD interrogation was NEGATIVE for significant power changes, negative for clinical alarms and STABLE for PI events/speed drops. No programming changes were made and pump is functioning within specified parameters. Pt is performing daily controller and system monitor self tests along with completing weekly and monthly maintenance for LVAD equipment.   LVAD equipment check completed and is in good working order. Back up equipment present at this appointment.  Exit Site Care: Drive line is being maintained weekly by David [name], his wife. Exit site healed and incorporated,  the velour is fully implanted at exit site. No redness, drainage, rash, or foul odor noted. Pt denies fever or chills. Pt given 8 weekly dressings and 8 anchors for home use.   Device: Medtronic Bi-V Therapies: VF 200 VT 167 Pacing:  VVI 50 Last check: 03/17/23  BP & Labs:  Doppler BP 94  - Doppler is reflecting MAP  Hgb 13.3 - No S/S of bleeding. Specifically denies melena/BRBPR or nosebleeds.  LDH 245 with established baseline of 165- 250. Denies tea-colored urine. No power elevations noted on interrogation.    Patient Instructions:  No medication changes today Coumadin dosing per Lauren PharmD Return to VAD clinic in 2 months.   David Davies RN,BSN VAD Coordinator  Office: [phone]  24/7 Pager: [phone]

## 2023-07-01 NOTE — Progress Notes (Signed)
 Patient seen in device clinic for hematoma recheck.   No swelling, redness or concerns noted to ICD implant. Patient reports doing well, swelling has decreased and no concerns. Advised to call if s/s of redness, swelling, drainage or any other concerns arise. Pt voiced understanding and agreeable to plan.

## 2023-07-02 NOTE — Progress Notes (Signed)
 LVAD Clinic Follow Up Note HF MD: Dr [name]  HPI: David [name] is a 58 y.o. male with history of ICD (09 PCI to LAD, mRCA, '10 PCI to Lcx and '12 mRCA), HFrEF (EF ~20% for > 5 years), HLD and HTN. S/P LVAD HM-III 04/20/18.    Admitted 04/19/17 - 05/27/2017 for scheduled LVAD implant. On 12/27 he underwent HMIII LVAD implant. Hospital course c/b PNA shock liver and AKI. Required short term HD.  Readmitted 05/30/17 hgb 3.8. INR 2.9.transfused 4u pRBCs. 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 Underwent ventriculostomy. Admission c/b by periods of confusion and urinary retention. Discharged to CIR  Was seen for acute visit on 03/27/23 for ICD firing x 2 on Thanksgiving.. Electrolytes ok. Seen by Dr. Graciela Husbands and ICD reprogrammed. Found to have lead fracture  Underwent lead extraction and placement of new ICD system in 1/25. C/p small chest hematoma. Now resolved  Here for f/u with his wife. Says he is doing great. After multiple discussion with his family has decided not to proceed with transplant evaluation. Denies orthopnea or PND. No fevers, chills or problems with driveline. No bleeding, melena or neuro symptoms. No VAD alarms. Taking all meds as prescribed.     VAD Indication: Destination therapy- CKD   VAD interrogation & Equipment Management: Speed: 5400 Flow: 4.8 Power: 4.0 w    PI: 3.6   Alarms: none Events: 0 - 10 daily  Fixed speed 5400 Low speed limit: 5100   Primary Controller:  Replace back up battery in 8 months Back up controller:  Replace back up battery in 6 months   Annual Equipment Maintenance on UBC/PM was performed on 03/27/2023.   I reviewed the LVAD parameters from today and compared the results to the patient's prior recorded data. LVAD interrogation was NEGATIVE for significant power changes, negative for clinical  alarms and STABLE for PI events/speed drops. No programming changes were made and pump is functioning within specified parameters. Pt is performing daily controller and system monitor self tests along with completing weekly and monthly maintenance for LVAD equipment.    Past Medical History:  Diagnosis Date   AICD (automatic cardioverter/defibrillator) present    Anemia    Anxiety    CAD (coronary artery disease) 2009   AMI in 12/2007 with PCI to LAD, staged PCI to  mid/distal RCA, NSTEMI in 02/2009 with BMS to LCx   CHF (congestive heart failure) (HCC)    Chronic kidney disease 11/03/2016   stage 3 kidney disease   Dysrhythmia    "arrythmia problems at some point", "fatal rhythms"   History of blood transfusion    "I was bleeding from was rectum" (04/19/2017)   HLD (hyperlipidemia)    HTN (hypertension)    Hyperlipidemia 03/15/2011   Ischemic cardiomyopathy    Admitted in 07/2010 with CHF exacerbation   Myocardial infarction (HCC)    "I've had 4" (04/19/2017)   Pneumonia 2018 X 1   Seizures (HCC)    one seizure in 04/2016 during cardiac event (04/19/2017)   STEMI 2009 (anterior), 2010 (lateral), and 2012 (inferior) 03/14/2011    Current Outpatient Medications  Medication Sig Dispense Refill   acetaminophen (TYLENOL) 325 MG tablet Take 2 tablets (650 mg total) by mouth every 4 (four) hours as needed for headache or mild pain.     amiodarone (PACERONE) 200  MG tablet Take 1 tablet (200 mg total) by mouth daily. 90 tablet 3   amLODipine (NORVASC) 5 MG tablet Take 1 tablet (5 mg total) by mouth daily. 90 tablet 3   atorvastatin (LIPITOR) 40 MG tablet Take 1 tablet (40 mg total) by mouth daily. 90 tablet 3   Blood Pressure Monitoring (BLOOD PRESSURE CUFF) MISC 1 Device by Does not apply route daily. 1 each 0   fluticasone (FLONASE) 50 MCG/ACT nasal spray Place 2 sprays into both nostrils daily. (Patient taking differently: Place 2 sprays into both nostrils daily as needed for allergies  or rhinitis.) 9.9 mL 3   losartan (COZAAR) 50 MG tablet Take 2 tablets (100 mg total) by mouth daily. (Patient taking differently: Take 50 mg by mouth 2 (two) times daily. Per patient taking 1 tablet daily) 180 tablet 3   magnesium oxide (MAG-OX) 400 (240 Mg) MG tablet Take 1 tablet (400 mg total) by mouth 2 (two) times daily. 180 tablet 3   Multiple Vitamin (MULTIVITAMIN WITH MINERALS) TABS tablet Take 1 tablet by mouth daily. 90 tablet 3   pantoprazole (PROTONIX) 40 MG tablet Take 1 tablet (40 mg total) by mouth daily. 90 tablet 3   polyethylene glycol (MIRALAX / GLYCOLAX) 17 g packet Take 17 g by mouth daily. (Patient taking differently: Take 17 g by mouth daily as needed for mild constipation or moderate constipation.) 30 each 0   senna (SENOKOT) 8.6 MG TABS tablet Take 2 tablets (17.2 mg total) by mouth at bedtime. (Patient taking differently: Take 2 tablets by mouth daily as needed for mild constipation or moderate constipation.) 120 tablet 0   tamsulosin (FLOMAX) 0.4 MG CAPS capsule Take 1 capsule (0.4 mg total) by mouth daily. 90 capsule 3   traZODone (DESYREL) 50 MG tablet Take 1 tablet (50 mg total) by mouth at bedtime. 90 tablet 3   warfarin (COUMADIN) 2 MG tablet TAKE 2 MG (1 TABLET) EVERY MON, WED, AND FRIDAY, AND TAKE 1 MG (1/2 TABLET) ALL OTHER DAYS OR AS DIRECTED BY HF CLINIC 30 tablet 6   No current facility-administered medications for this encounter.    Plavix [clopidogrel bisulfate]  Review of systems complete and found to be negative unless listed in HPI.       Wt Readings from Last 3 Encounters:  06/29/23 80 kg (176 lb 6.4 oz)  06/09/23 79.7 kg (175 lb 9.6 oz)  05/26/23 80.1 kg (176 lb 9.4 oz)   Vital Signs: Doppler Pressure: 94 Automatic BP: 99/65 (76) HR: 54 NSR  SPO2: 95% RA   Weight: 176.4 lb w/ eqt Last weight: 176.8 lb w/ eqt    Physical Exam: General:  NAD.  HEENT: normal  Neck: supple. JVP not elevated.  Carotids 2+ bilat; no bruits. No  lymphadenopathy or thryomegaly appreciated. Cor: LVAD hum. ICD site ok  Lungs: Clear. Abdomen: soft, nontender, non-distended. No hepatosplenomegaly. No bruits or masses. Good bowel sounds. Driveline site clean. Anchor in place.  Extremities: no cyanosis, clubbing, rash. Warm no edema  Neuro: alert & oriented x 3. No focal deficits. Moves all 4 without problem    ASSESSMENT AND PLAN:  1. Chronic systolic CHF with HM III LVAD implant 04/20/17:  - Doing well with VAD support. NYHA I - Volume status looks good.  - Again discussed possible transplant with him and his wife and he has decided not to proceed  2. Ventricular tachycardia - Had ICD shock x 2 for VT on 03/23/23 (device reprogrammed at the time) -  Had lead fracture. Device replaced 1/25 - No recent VT - Continue amio - Follows with EP  3. PAF/AFL - had DC-CV on 10/17/18 with subsequent short episodes of AF on amio 100 daily - Had recurrent AF in 10/22. Amio increased to 200 bid and converted.  - In NSR - Continue warfarin/amio  4. SAH - admit in 2/23 with [facility] requiring ventriculostomy - much improved. HAs resolved.  - no recurrence.  - MAPs under much better control  5. h/o UGI AVM bleed - s/p APC x 4 lesions in 2/19 - No recurrent bleeding - Continue PPI - Off ASA due to GIB.   6. CKD Stage IIIa:  - Creatinine baseline 1.3 - 1.8 - Stable at 1.8 today - Continue to follow - Consider SGLT2i if volume status can handle  7. VAD management - VAD interrogated personally. Parameters stable. - LDH 245 - INR goal 1.5-2.0 with h/o SAH Off ASA   - INR 2.0 today  Discussed warfarin dosing with PharmD personally. - MAPs ok  - DLsite looks good  8. CAD:  s/p PCI RCA/PLOM with DES x2 and DES to mild LAD 1/18 - No s/s angina - Continue statin. Off ASA due to GIB  9. Hypomagnesemia. - Continue Mg supplementation  10. HTN - MAPs with much improved control  11. [employer] fan - unfortunately this has been  incurable even with VAD implant - will likely need to route for [employer] in the Tournament as [employer] likely ill be in NIT  I spent a total of 48 minutes today: 1) reviewing the patient's medical records including previous charts, labs and recent notes from other providers; 2) examining the patient and counseling them on their medical issues/explaining the plan of care; 3) adjusting meds as needed and 4) ordering lab work or other needed tests.    Arvilla Meres, MD  3:31 PM

## 2023-07-03 ENCOUNTER — Ambulatory Visit (HOSPITAL_COMMUNITY): Payer: Self-pay | Admitting: Pharmacist

## 2023-07-03 LAB — POCT INR: INR: 1.8 — AB (ref 2.0–3.0)

## 2023-07-10 ENCOUNTER — Other Ambulatory Visit (HOSPITAL_COMMUNITY): Payer: Self-pay | Admitting: Unknown Physician Specialty

## 2023-07-10 ENCOUNTER — Encounter (HOSPITAL_COMMUNITY): Payer: Self-pay | Admitting: Internal Medicine

## 2023-07-10 ENCOUNTER — Inpatient Hospital Stay (HOSPITAL_COMMUNITY)
Admission: AD | Admit: 2023-07-10 | Discharge: 2023-07-14 | DRG: 193 | Disposition: A | Discharge status: Home / Self Care | Source: Ambulatory Visit | Attending: Internal Medicine | Admitting: Internal Medicine

## 2023-07-10 ENCOUNTER — Ambulatory Visit (HOSPITAL_BASED_OUTPATIENT_CLINIC_OR_DEPARTMENT_OTHER)
Admission: RE | Admit: 2023-07-10 | Discharge: 2023-07-10 | Disposition: A | Discharge status: Home / Self Care | Source: Ambulatory Visit | Attending: Internal Medicine | Admitting: Internal Medicine

## 2023-07-10 ENCOUNTER — Encounter (HOSPITAL_COMMUNITY): Payer: Self-pay

## 2023-07-10 DIAGNOSIS — R339 Retention of urine, unspecified: Secondary | ICD-10-CM | POA: Diagnosis not present

## 2023-07-10 DIAGNOSIS — I2510 Atherosclerotic heart disease of native coronary artery without angina pectoris: Secondary | ICD-10-CM | POA: Diagnosis present

## 2023-07-10 DIAGNOSIS — J101 Influenza due to other identified influenza virus with other respiratory manifestations: Principal | ICD-10-CM | POA: Diagnosis present

## 2023-07-10 DIAGNOSIS — E785 Hyperlipidemia, unspecified: Secondary | ICD-10-CM | POA: Diagnosis present

## 2023-07-10 DIAGNOSIS — I5022 Chronic systolic (congestive) heart failure: Secondary | ICD-10-CM | POA: Diagnosis present

## 2023-07-10 DIAGNOSIS — I130 Hypertensive heart and chronic kidney disease with heart failure and stage 1 through stage 4 chronic kidney disease, or unspecified chronic kidney disease: Secondary | ICD-10-CM | POA: Diagnosis present

## 2023-07-10 DIAGNOSIS — I255 Ischemic cardiomyopathy: Secondary | ICD-10-CM | POA: Diagnosis present

## 2023-07-10 DIAGNOSIS — R197 Diarrhea, unspecified: Secondary | ICD-10-CM | POA: Diagnosis present

## 2023-07-10 DIAGNOSIS — E861 Hypovolemia: Secondary | ICD-10-CM | POA: Diagnosis present

## 2023-07-10 DIAGNOSIS — Z8719 Personal history of other diseases of the digestive system: Secondary | ICD-10-CM

## 2023-07-10 DIAGNOSIS — Z833 Family history of diabetes mellitus: Secondary | ICD-10-CM | POA: Diagnosis not present

## 2023-07-10 DIAGNOSIS — Z1152 Encounter for screening for COVID-19: Secondary | ICD-10-CM | POA: Diagnosis not present

## 2023-07-10 DIAGNOSIS — Z7901 Long term (current) use of anticoagulants: Secondary | ICD-10-CM | POA: Insufficient documentation

## 2023-07-10 DIAGNOSIS — N170 Acute kidney failure with tubular necrosis: Secondary | ICD-10-CM | POA: Diagnosis present

## 2023-07-10 DIAGNOSIS — I480 Paroxysmal atrial fibrillation: Secondary | ICD-10-CM | POA: Diagnosis present

## 2023-07-10 DIAGNOSIS — Z825 Family history of asthma and other chronic lower respiratory diseases: Secondary | ICD-10-CM

## 2023-07-10 DIAGNOSIS — I959 Hypotension, unspecified: Secondary | ICD-10-CM | POA: Diagnosis present

## 2023-07-10 DIAGNOSIS — Z955 Presence of coronary angioplasty implant and graft: Secondary | ICD-10-CM

## 2023-07-10 DIAGNOSIS — Z95811 Presence of heart assist device: Secondary | ICD-10-CM

## 2023-07-10 DIAGNOSIS — Z8249 Family history of ischemic heart disease and other diseases of the circulatory system: Secondary | ICD-10-CM

## 2023-07-10 DIAGNOSIS — Z23 Encounter for immunization: Secondary | ICD-10-CM

## 2023-07-10 DIAGNOSIS — Z452 Encounter for adjustment and management of vascular access device: Secondary | ICD-10-CM | POA: Insufficient documentation

## 2023-07-10 DIAGNOSIS — Z5986 Financial insecurity: Secondary | ICD-10-CM

## 2023-07-10 DIAGNOSIS — E875 Hyperkalemia: Secondary | ICD-10-CM | POA: Diagnosis present

## 2023-07-10 DIAGNOSIS — Z8679 Personal history of other diseases of the circulatory system: Secondary | ICD-10-CM

## 2023-07-10 DIAGNOSIS — Z87891 Personal history of nicotine dependence: Secondary | ICD-10-CM

## 2023-07-10 DIAGNOSIS — Z95810 Presence of automatic (implantable) cardiac defibrillator: Secondary | ICD-10-CM

## 2023-07-10 DIAGNOSIS — R0602 Shortness of breath: Principal | ICD-10-CM

## 2023-07-10 DIAGNOSIS — Z79899 Other long term (current) drug therapy: Secondary | ICD-10-CM

## 2023-07-10 DIAGNOSIS — I252 Old myocardial infarction: Secondary | ICD-10-CM

## 2023-07-10 DIAGNOSIS — N1831 Chronic kidney disease, stage 3a: Secondary | ICD-10-CM | POA: Diagnosis present

## 2023-07-10 DIAGNOSIS — Z888 Allergy status to other drugs, medicaments and biological substances status: Secondary | ICD-10-CM

## 2023-07-10 DIAGNOSIS — D631 Anemia in chronic kidney disease: Secondary | ICD-10-CM | POA: Diagnosis present

## 2023-07-10 LAB — COMPREHENSIVE METABOLIC PANEL
ALT: 26 U/L (ref 0–44)
AST: 55 U/L — ABNORMAL HIGH (ref 15–41)
Albumin: 2.5 g/dL — ABNORMAL LOW (ref 3.5–5.0)
Alkaline Phosphatase: 108 U/L (ref 38–126)
Anion gap: 12 (ref 5–15)
BUN: 37 mg/dL — ABNORMAL HIGH (ref 6–20)
CO2: 19 mmol/L — ABNORMAL LOW (ref 22–32)
Calcium: 8.3 mg/dL — ABNORMAL LOW (ref 8.9–10.3)
Chloride: 100 mmol/L (ref 98–111)
Creatinine, Ser: 3.5 mg/dL — ABNORMAL HIGH (ref 0.61–1.24)
GFR, Estimated: 19 mL/min — ABNORMAL LOW (ref >60)
Glucose, Bld: 113 mg/dL — ABNORMAL HIGH (ref 70–99)
Potassium: 4.3 mmol/L (ref 3.5–5.1)
Sodium: 131 mmol/L — ABNORMAL LOW (ref 135–145)
Total Bilirubin: 1.3 mg/dL — ABNORMAL HIGH (ref 0.0–1.2)
Total Protein: 7.5 g/dL (ref 6.5–8.1)

## 2023-07-10 LAB — CBC
HCT: 38.3 % — ABNORMAL LOW (ref 39.0–52.0)
Hemoglobin: 12.3 g/dL — ABNORMAL LOW (ref 13.0–17.0)
MCH: 25.1 pg — ABNORMAL LOW (ref 26.0–34.0)
MCHC: 32.1 g/dL (ref 30.0–36.0)
MCV: 78.2 fL — ABNORMAL LOW (ref 80.0–100.0)
Platelets: 132 10*3/uL — ABNORMAL LOW (ref 150–400)
RBC: 4.9 MIL/uL (ref 4.22–5.81)
RDW: 15.8 % — ABNORMAL HIGH (ref 11.5–15.5)
WBC: 18.9 10*3/uL — ABNORMAL HIGH (ref 4.0–10.5)
nRBC: 0 % (ref 0.0–0.2)

## 2023-07-10 LAB — PROTIME-INR
INR: 2 — ABNORMAL HIGH (ref 0.8–1.2)
Prothrombin Time: 23.3 s — ABNORMAL HIGH (ref 11.4–15.2)

## 2023-07-10 LAB — LACTATE DEHYDROGENASE: LDH: 264 U/L — ABNORMAL HIGH (ref 98–192)

## 2023-07-10 LAB — RESP PANEL BY RT-PCR (RSV, FLU A&B, COVID)  RVPGX2
Influenza A by PCR: POSITIVE — AB
Influenza B by PCR: NEGATIVE
Resp Syncytial Virus by PCR: NEGATIVE
SARS Coronavirus 2 by RT PCR: NEGATIVE

## 2023-07-10 LAB — MAGNESIUM: Magnesium: 1.8 mg/dL (ref 1.7–2.4)

## 2023-07-10 MED ORDER — VASOPRESSIN 20 UNITS/100 ML INFUSION FOR SHOCK: 0.0000 [IU]/min | INTRAVENOUS | Status: DC

## 2023-07-10 MED ORDER — POLYETHYLENE GLYCOL 3350 17 G PO PACK: 17.0000 g | PACK | Freq: Every day | ORAL | Status: DC | PRN

## 2023-07-10 MED ORDER — AMIODARONE HCL 200 MG PO TABS
200.0000 mg | ORAL_TABLET | Freq: Every day | ORAL | Status: DC
  Administered 2023-07-10 – 2023-07-14 (×5): 200 mg via ORAL
  Filled 2023-07-10 (×5): qty 1

## 2023-07-10 MED ORDER — LACTATED RINGERS IV BOLUS
1500.0000 mL | Freq: Once | INTRAVENOUS | Status: AC
  Administered 2023-07-10: 1500 mL via INTRAVENOUS

## 2023-07-10 MED ORDER — LACTATED RINGERS IV BOLUS
1000.0000 mL | Freq: Once | INTRAVENOUS | Status: AC
  Administered 2023-07-10: 1000 mL via INTRAVENOUS

## 2023-07-10 MED ORDER — ACETAMINOPHEN 325 MG PO TABS
650.0000 mg | ORAL_TABLET | ORAL | Status: DC | PRN
  Administered 2023-07-10 – 2023-07-11 (×2): 650 mg via ORAL
  Filled 2023-07-10 (×2): qty 2

## 2023-07-10 MED ORDER — PANTOPRAZOLE SODIUM 40 MG PO TBEC
40.0000 mg | DELAYED_RELEASE_TABLET | Freq: Every day | ORAL | Status: DC
  Administered 2023-07-10 – 2023-07-14 (×5): 40 mg via ORAL
  Filled 2023-07-10 (×5): qty 1

## 2023-07-10 MED ORDER — TRAZODONE HCL 50 MG PO TABS
50.0000 mg | ORAL_TABLET | Freq: Every day | ORAL | Status: DC
  Administered 2023-07-10 – 2023-07-13 (×4): 50 mg via ORAL
  Filled 2023-07-10 (×4): qty 1

## 2023-07-10 MED ORDER — OSELTAMIVIR PHOSPHATE 30 MG PO CAPS
30.0000 mg | ORAL_CAPSULE | Freq: Two times a day (BID) | ORAL | Status: DC
Start: 2023-07-10 — End: 2023-07-15
  Administered 2023-07-10 – 2023-07-14 (×8): 30 mg via ORAL
  Filled 2023-07-10 (×9): qty 1

## 2023-07-10 MED ORDER — SENNA 8.6 MG PO TABS: 2.0000 | ORAL_TABLET | Freq: Every evening | ORAL | Status: DC | PRN

## 2023-07-10 MED ORDER — TAMSULOSIN HCL 0.4 MG PO CAPS
0.4000 mg | ORAL_CAPSULE | Freq: Every day | ORAL | Status: DC
  Administered 2023-07-10 – 2023-07-14 (×5): 0.4 mg via ORAL
  Filled 2023-07-10 (×5): qty 1

## 2023-07-10 MED ORDER — LACTATED RINGERS IV BOLUS
500.0000 mL | Freq: Once | INTRAVENOUS | Status: AC
Start: 2023-07-10 — End: 2023-07-10
  Administered 2023-07-10: 500 mL via INTRAVENOUS

## 2023-07-10 MED ORDER — WARFARIN SODIUM 1 MG PO TABS
1.0000 mg | ORAL_TABLET | Freq: Once | ORAL | Status: AC
  Administered 2023-07-10: 1 mg via ORAL
  Filled 2023-07-10: qty 1

## 2023-07-10 MED ORDER — ALBUMIN HUMAN 5 % IV SOLN: 12.5000 g | Freq: Once | INTRAVENOUS | Status: AC

## 2023-07-10 MED ORDER — WARFARIN - PHARMACIST DOSING INPATIENT: Freq: Every day | Status: DC

## 2023-07-10 MED ORDER — ADULT MULTIVITAMIN W/MINERALS CH
1.0000 | ORAL_TABLET | Freq: Every day | ORAL | Status: DC
  Administered 2023-07-11 – 2023-07-14 (×4): 1 via ORAL
  Filled 2023-07-10 (×4): qty 1

## 2023-07-10 MED ORDER — ATORVASTATIN CALCIUM 40 MG PO TABS
40.0000 mg | ORAL_TABLET | Freq: Every day | ORAL | Status: DC
  Administered 2023-07-10 – 2023-07-14 (×5): 40 mg via ORAL
  Filled 2023-07-10 (×5): qty 1

## 2023-07-10 MED ORDER — ALBUMIN HUMAN 5 % IV SOLN
INTRAVENOUS | Status: AC
Start: 2023-07-10 — End: 2023-07-10
  Administered 2023-07-10: 12.5 g via INTRAVENOUS
  Filled 2023-07-10: qty 250

## 2023-07-10 MED ORDER — OSELTAMIVIR PHOSPHATE 75 MG PO CAPS
75.0000 mg | ORAL_CAPSULE | Freq: Once | ORAL | Status: AC
  Administered 2023-07-10: 75 mg via ORAL
  Filled 2023-07-10: qty 1

## 2023-07-10 MED ORDER — CHLORHEXIDINE GLUCONATE CLOTH 2 % EX PADS
6.0000 | MEDICATED_PAD | Freq: Every day | CUTANEOUS | Status: DC
  Administered 2023-07-11 – 2023-07-13 (×3): 6 via TOPICAL

## 2023-07-10 NOTE — H&P (Addendum)
 Advanced Heart Failure VAD History and Physical Note   PCP-Cardiologist: Arvilla Meres, MD   Reason for Admission: SOB, hypotension  HPI:   David [name] is a 58 y.o. male with history of ICD (09 PCI to LAD, mRCA, '10 PCI to Lcx and '12 mRCA), HFrEF (EF ~20% for > 5 years), HLD and HTN. S/P LVAD HM-III 04/20/18.    Admitted 04/19/17 - 05/27/2017 for scheduled LVAD implant. On 12/27 he underwent HMIII LVAD implant. Hospital course c/b PNA shock liver and AKI. Required short term HD.   Readmitted 05/30/17 hgb 3.8. INR 2.9.transfused 4u pRBCs. EGD - normal esophagus, 4 nonbleeding gastric AVMs treated with APC, normal duodenum and jejunum   Underwent DC-CV for AFL on 10/17/18    Admitted in 2/23 with SAH in setting of HTN. Underwent ventriculostomy. Admission c/b by periods of confusion and urinary retention. Discharged to CIR   Was seen for acute visit on 03/27/23 for ICD firing x 2 on Thanksgiving.. Electrolytes ok. Seen by Dr. Graciela Husbands and ICD reprogrammed. Found to have lead fracture   Underwent lead extraction and placement of new ICD system in 1/25. C/p small chest hematoma. Now resolved   He was admitted post LVAD clinic visit where he was found to be diaphoretic, SOB, tachypneic, pale and hypotensive. He was recently around his grandchildren who had Norovirus. This morning he woke up and was unable to void and had diarrhea. Found to have a productive cough. O2 sats in clinic were initially 87%, came up to 97% on 3L [place]. Reported chills over the weekend. Had dizziness once or twice over the weekend that quickly resolved. Denies CP.   LVAD INTERROGATION:  HeartMate III LVAD:  Flow 5.2 liters/min, speed 5400, power 4, PI 2.  No PI events today.    Home Medications Prior to Admission medications   Medication Sig Start Date End Date Taking? Authorizing Provider  acetaminophen (TYLENOL) 325 MG tablet Take 2 tablets (650 mg total) by mouth every 4 (four) hours as needed for headache or  mild pain. 06/28/21   Andrey Farmer, PA-C  amiodarone (PACERONE) 200 MG tablet Take 1 tablet (200 mg total) by mouth daily. 04/14/23   Carlee Tesfaye, Bevelyn Buckles, MD  amLODipine (NORVASC) 5 MG tablet Take 1 tablet (5 mg total) by mouth daily. 04/14/23   Eriverto Byrnes, Bevelyn Buckles, MD  atorvastatin (LIPITOR) 40 MG tablet Take 1 tablet (40 mg total) by mouth daily. 04/14/23   Rosser Collington, Bevelyn Buckles, MD  Blood Pressure Monitoring (BLOOD PRESSURE CUFF) MISC 1 Device by Does not apply route daily. Patient not taking: Reported on 07/10/2023 12/13/21   Yeison Sippel, Bevelyn Buckles, MD  fluticasone [facility]) 50 MCG/ACT nasal spray Place 2 sprays into both nostrils daily. Patient taking differently: Place 2 sprays into both nostrils daily as needed for allergies or rhinitis. 04/14/23   Seamus Warehime, Bevelyn Buckles, MD  losartan (COZAAR) 50 MG tablet Take 2 tablets (100 mg total) by mouth daily. Patient taking differently: Take 50 mg by mouth 2 (two) times daily. Per patient taking 1 tablet daily 04/14/23   Chuong Casebeer, Bevelyn Buckles, MD  magnesium oxide (MAG-OX) 400 (240 Mg) MG tablet Take 1 tablet (400 mg total) by mouth 2 (two) times daily. 04/14/23   Zhana Jeangilles, Bevelyn Buckles, MD  Multiple Vitamin (MULTIVITAMIN WITH MINERALS) TABS tablet Take 1 tablet by mouth daily. 04/14/23   Timberlynn Kizziah, Bevelyn Buckles, MD  pantoprazole (PROTONIX) 40 MG tablet Take 1 tablet (40 mg total) by mouth daily. 04/14/23   Anwen Cannedy, Bevelyn Buckles,  MD  polyethylene glycol (MIRALAX / GLYCOLAX) 17 g packet Take 17 g by mouth daily. 07/06/21   Love, Evlyn Kanner, PA-C  senna (SENOKOT) 8.6 MG TABS tablet Take 2 tablets (17.2 mg total) by mouth at bedtime. 07/06/21   Love, Evlyn Kanner, PA-C  tamsulosin (FLOMAX) 0.4 MG CAPS capsule Take 1 capsule (0.4 mg total) by mouth daily. 04/14/23   Kay Ricciuti, Bevelyn Buckles, MD  traZODone (DESYREL) 50 MG tablet Take 1 tablet (50 mg total) by mouth at bedtime. 04/14/23   Areya Lemmerman, Bevelyn Buckles, MD  warfarin (COUMADIN) 2 MG tablet TAKE 2 MG (1 TABLET) EVERY MON, WED,  AND FRIDAY, AND TAKE 1 MG (1/2 TABLET) ALL OTHER DAYS OR AS DIRECTED BY HF CLINIC 05/27/23   Lee, [place], NP   Past Medical History: Past Medical History:  Diagnosis Date   AICD (automatic cardioverter/defibrillator) present    Anemia    Anxiety    CAD (coronary artery disease) 2009   AMI in 12/2007 with PCI to LAD, staged PCI to  mid/distal RCA, NSTEMI in 02/2009 with BMS to LCx   CHF (congestive heart failure) (HCC)    Chronic kidney disease 11/03/2016   stage 3 kidney disease   Dysrhythmia    "arrythmia problems at some point", "fatal rhythms"   History of blood transfusion    "I was bleeding from was rectum" (04/19/2017)   HLD (hyperlipidemia)    HTN (hypertension)    Hyperlipidemia 03/15/2011   Ischemic cardiomyopathy    Admitted in 07/2010 with CHF exacerbation   Myocardial infarction (HCC)    "I've had 4" (04/19/2017)   Pneumonia 2018 X 1   Seizures (HCC)    one seizure in 04/2016 during cardiac event (04/19/2017)   STEMI 2009 (anterior), 2010 (lateral), and 2012 (inferior) 03/14/2011    Past Surgical History: Past Surgical History:  Procedure Laterality Date   AORTIC VALVE REPAIR N/A 04/20/2017   Procedure: AORTIC VALVE REPAIR;  Surgeon: Alleen Borne, MD;  Location: [facility] OR;  Service: Open Heart Surgery;  Laterality: N/A;   BIV ICD INSERTION CRT-D N/A 08/01/2016   Procedure: BiV ICD Insertion CRT-D;  Surgeon: Will Jorja Loa, MD;  Location: [facility] INVASIVE CV LAB;  Service: Cardiovascular;  Laterality: N/A;   CARDIAC CATHETERIZATION N/A 05/05/2016   Procedure: Right/Left Heart Cath and Coronary Angiography;  Surgeon: Dolores Patty, MD;  Location: [facility] INVASIVE CV LAB;  Service: Cardiovascular;  Laterality: N/A;   CARDIAC CATHETERIZATION N/A 05/09/2016   Procedure: Coronary Stent Intervention;  Surgeon: Peter M [place], MD;  Location: [facility] INVASIVE CV LAB;  Service: Cardiovascular;  Laterality: N/A;   CARDIAC CATHETERIZATION N/A 05/09/2016   Procedure: Intravascular Pressure  Wire/FFR Study;  Surgeon: Peter M [place], MD;  Location: [facility] INVASIVE CV LAB;  Service: Cardiovascular;  Laterality: N/A;   CARDIOVERSION N/A 10/17/2018   Procedure: CARDIOVERSION;  Surgeon: Dolores Patty, MD;  Location: [facility] ENDOSCOPY;  Service: Cardiovascular;  Laterality: N/A;   CORONARY ANGIOPLASTY WITH STENT PLACEMENT     "i've got a total of 12 stents" (04/19/2017)   ELECTROPHYSIOLOGY STUDY N/A 06/24/2016   Procedure: Electrophysiology Study;  Surgeon: Duke Salvia, MD;  Location: [facility] INVASIVE CV LAB;  Service: Cardiovascular;  Laterality: N/A;   ENTEROSCOPY N/A 06/03/2017   Procedure: ENTEROSCOPY;  Surgeon: Benancio Deeds, MD;  Location: [facility] ENDOSCOPY;  Service: Gastroenterology;  Laterality: N/A;   EPICARDIAL PACING LEAD PLACEMENT Left 11/07/2016   Procedure: LV EPICARDIAL PACING LEAD PLACEMENT VIA LEFT MINI THORACOTOMY;  Surgeon: Evelene Croon  K, MD;  Location: [facility] OR;  Service: Thoracic;  Laterality: Left;   ICD IMPLANT N/A 06/24/2016   Procedure: POSSIBLE  ICD Implant;  Surgeon: Duke Salvia, MD;  Location: [facility] INVASIVE CV LAB;  Service: Cardiovascular;  Laterality: N/A;   INSERTION OF IMPLANTABLE LEFT VENTRICULAR ASSIST DEVICE N/A 04/20/2017   Procedure: INSERTION OF IMPLANTABLE LEFT VENTRICULAR ASSIST DEVICE, Aortic Valve repair;  Surgeon: Alleen Borne, MD;  Location: [facility] OR;  Service: Open Heart Surgery;  Laterality: N/A;  Heartmate 3   IR FLUORO GUIDE CV LINE RIGHT  05/08/2017   IR FLUORO GUIDE CV MIDLINE PICC RIGHT  03/14/2017   IR REMOVAL TUN CV CATH W/O FL  05/25/2017   IR US GUIDE VASC ACCESS RIGHT  03/14/2017   IR US GUIDE VASC ACCESS RIGHT  05/08/2017   LEAD EXTRACTION N/A 05/24/2023   Procedure: LEAD EXTRACTION;  Surgeon: Lanier Prude, MD;  Location: [facility] INVASIVE CV LAB;  Service: Cardiovascular;  Laterality: N/A;   LEFT HEART CATHETERIZATION WITH CORONARY ANGIOGRAM N/A 03/13/2011   Procedure: LEFT HEART CATHETERIZATION WITH CORONARY ANGIOGRAM;  Surgeon: Runell Gess, MD;  Location[facility] CATH LAB;  Service: Cardiovascular;  Laterality: N/A;   MULTIPLE EXTRACTIONS WITH ALVEOLOPLASTY N/A 04/12/2017   Procedure: Extraction of tooth #'s 2, 5-12, 17, 20-29 with alveoloplasty amd maxillary right buccal exostoses reductions.;  Surgeon: Charlynne Pander, DDS;  Location: [facility] OR;  Service: Oral Surgery;  Laterality: N/A;   RIGHT HEART CATH N/A 04/19/2017   Procedure: RIGHT HEART CATH;  Surgeon: Dolores Patty, MD;  Location: [facility] INVASIVE CV LAB;  Service: Cardiovascular;  Laterality: N/A;   RIGHT/LEFT HEART CATH AND CORONARY ANGIOGRAPHY N/A 03/10/2017   Procedure: RIGHT/LEFT HEART CATH AND CORONARY ANGIOGRAPHY;  Surgeon: Dolores Patty, MD;  Location: [facility] INVASIVE CV LAB;  Service: Cardiovascular;  Laterality: N/A;   TEE WITHOUT CARDIOVERSION N/A 04/20/2017   Procedure: TRANSESOPHAGEAL ECHOCARDIOGRAM (TEE);  Surgeon: Alleen Borne, MD;  Location: [facility] OR;  Service: Open Heart Surgery;  Laterality: N/A;   TEE WITHOUT CARDIOVERSION N/A 10/17/2018   Procedure: TRANSESOPHAGEAL ECHOCARDIOGRAM (TEE);  Surgeon: Dolores Patty, MD;  Location: [facility] ENDOSCOPY;  Service: Cardiovascular;  Laterality: N/A;   TRANSESOPHAGEAL ECHOCARDIOGRAM (CATH LAB) N/A 05/24/2023   Procedure: TRANSESOPHAGEAL ECHOCARDIOGRAM;  Surgeon: Lanier Prude, MD;  Location: [facility] INVASIVE CV LAB;  Service: Cardiovascular;  Laterality: N/A;    Family History: Family History  Problem Relation Age of Onset   Coronary artery disease Father    Hypertension Father    Hypertension Sister    Coronary artery disease Brother    Emphysema Brother    Coronary artery disease Mother    Hypertension Mother    Emphysema Mother    Hypertension Daughter    Obesity Daughter    Diabetes Other    Hypertension Other    Coronary artery disease Other     Social History: Social History   Socioeconomic History   Marital status: Married    Spouse name: Vikki Ports   Number of children: 3   Years of  education: Not on file   Highest education level: Not on file  Occupational History   Not on file  Tobacco Use   Smoking status: Former    Current packs/day: 0.00    Average packs/day: 0.5 packs/day for 43.0 years (21.5 ttl pk-yrs)    Types: Cigarettes, Cigars    Start date: 04/29/1973    Quit date: 04/29/2016    Years since quitting: 7.2  Smokeless tobacco: Never  Vaping Use   Vaping status: Former   Quit date: 04/29/2016  Substance and Sexual Activity   Alcohol use: No   Drug use: No   Sexual activity: Yes  Other Topics Concern   Not on file  Social History Narrative   Not on file   Social Drivers of Health   Financial Resource Strain: High Risk (07/04/2022)   Overall Financial Resource Strain (CARDIA)    Difficulty of Paying Living Expenses: Very hard  Food Insecurity: No Food Insecurity (07/10/2023)   Hunger Vital Sign    Worried About Running Out of Food in the Last Year: Never true    Ran Out of Food in the Last Year: Never true  Recent Concern: Food Insecurity - Food Insecurity Present (05/24/2023)   Hunger Vital Sign    Worried About Running Out of Food in the Last Year: Never true    Ran Out of Food in the Last Year: Sometimes true  Transportation Needs: No Transportation Needs (07/10/2023)   PRAPARE - Administrator, Civil Service (Medical): No    Lack of Transportation (Non-Medical): No  Physical Activity: Inactive (03/10/2017)   Exercise Vital Sign    Days of Exercise per Week: 0 days    Minutes of Exercise per Session: 0 min  Stress: No Stress Concern Present (03/10/2017)   [employer] of Occupational Health - Occupational Stress Questionnaire    Feeling of Stress : Not at all  Social Connections: Not on file    Allergies:  Allergies  Allergen Reactions   Plavix [Clopidogrel Bisulfate] Hives    Objective:    Vital Signs:   Resp:  [16-31] 29 (03/17 1300) BP: (74-79)/(54-68) 77/54 (03/17 1300) Last BM Date :  (pta) There were no  vitals filed for this visit.  Mean arterial Pressure 70s (Personally reviewed)    Physical Exam  General:  Pale appearing.  HEENT: +[place] Neck: supple. JVP flat.  Cor: Mechanical heart sounds with LVAD hum present. Lungs: Clear,diminished bases Driveline: C/D/I; securement device intact and driveline incorporated Extremities: no cyanosis, clubbing, rash, edema Neuro: alert & oriented x3. Moves all 4 extremities w/o difficulty. Affect pleasant   Telemetry   NSR 90s (Personally reviewed)    EKG   NSR with 1AVB 05/25/23  Labs    Basic Metabolic Panel: Recent Labs  Lab 07/10/23 1113  NA 131*  K 4.3  CL 100  CO2 19*  GLUCOSE 113*  BUN 37*  CREATININE 3.50*  CALCIUM 8.3*  MG 1.8    Liver Function Tests: Recent Labs  Lab 07/10/23 1113  AST 55*  ALT 26  ALKPHOS 108  BILITOT 1.3*  PROT 7.5  ALBUMIN 2.5*   No results for input(s): "LIPASE", "AMYLASE" in the last 168 hours. No results for input(s): "AMMONIA" in the last 168 hours.  CBC: Recent Labs  Lab 07/10/23 1113  WBC 18.9*  HGB 12.3*  HCT 38.3*  MCV 78.2*  PLT 132*    Cardiac Enzymes: No results for input(s): "CKTOTAL", "CKMB", "CKMBINDEX", "TROPONINI" in the last 168 hours.  BNP: BNP (last 3 results) No results for input(s): "BNP" in the last 8760 hours.  ProBNP (last 3 results) No results for input(s): "PROBNP" in the last 8760 hours.   CBG: No results for input(s): "GLUCAP" in the last 168 hours.  Coagulation Studies: Recent Labs    07/10/23 1113  LABPROT 23.3*  INR 2.0*    Other results:  Imaging    No  results found.  Patient Profile:   David [name] is a 58 y.o. male with history of ICD (09 PCI to LAD, mRCA, '10 PCI to Lcx and '12 mRCA), HFrEF (EF ~20% for > 5 years), HLD and HTN. S/P LVAD HM-III 04/20/18. Admitted post LVAD clinic visit. He was diaphoretic, SOB and hypotensive.   Assessment/Plan:   SOB  hypotension  diarrhea - VAD MAPs 60s today in LVAD clinic -  Recently around grand kids with Norovirus - Will give 1L IVF - suspect hypovolemia 2/2 GI losses - Avoid hypotension - check resp panel  2 . Chronic systolic CHF with HM III LVAD implant 04/20/17:  - Doing well with VAD support.  - NYHA IIIa-b today - Appears hypovolemic today. See #1 - Holding BP meds with soft BP - Have discussed possible transplant with him and his wife multiple times and he has decided not to proceed   3. VAD management - VAD interrogated personally. Parameters stable. - LDH pending - INR goal 1.5-2.0 with h/o SAH Off ASA   - INR pending today. Will discuss dosing / restarting of Warfarin with PharmD pending H&H - MAPs low - DL site looks good  4. Hx of Ventricular tachycardia - Had ICD shock x 2 for VT on 03/23/23 (device reprogrammed at the time) - Had lead fracture. Device replaced 1/25 - No recent VT - Continue amio - Follows with EP   5. PAF/AFL - had DC-CV on 10/17/18 with subsequent short episodes of AF on amio 100 daily - Had recurrent AF in 10/22. Amio increased to 200 bid and converted.  - In NSR - Continue warfarin/amio   6. SAH - admit in 2/23 with [facility] requiring ventriculostomy - much improved. Has resolved. No recurrence.  - MAPs under much better control   7. h/o UGI AVM bleed - s/p APC x 4 lesions in 2/19 - No recurrent bleeding - Continue PPI - Off ASA due to GIB.    8. AKI on CKD Stage IIIa:  - Creatinine baseline 1.3 - 1.8 - SCr 3.5 - Suspect 2/2 hypovolemia - Avoid hypotension   9. CAD:  s/p PCI RCA/PLOM with DES x2 and DES to mild LAD 1/18 - No s/s angina - Continue statin. Off ASA due to GIB   10. Hypomagnesemia. - Continue Mg supplementation   11. HTN - Hypotensive today - Follow      CRITICAL CARE Performed by: Alen Bleacher  Total critical care time: 18 minutes  Critical care time was exclusive of separately billable procedures and treating other patients.  Critical care was necessary to treat or prevent  imminent or life-threatening deterioration.  Critical care was time spent personally by me on the following activities: development of treatment plan with patient and/or surrogate as well as nursing, discussions with consultants, evaluation of patient's response to treatment, examination of patient, obtaining history from patient or surrogate, ordering and performing treatments and interventions, ordering and review of laboratory studies, ordering and review of radiographic studies, pulse oximetry and re-evaluation of patient's condition.   I reviewed the LVAD parameters from today, and compared the results to the patient's prior recorded data.  No programming changes were made.  The LVAD is functioning within specified parameters.  The patient performs LVAD self-test daily.  LVAD interrogation was negative for any significant power changes, alarms or PI events/speed drops.  LVAD equipment check completed and is in good working order.  Back-up equipment present.   LVAD education done on emergency  procedures and precautions and reviewed exit site care.  Length of Stay: 0  Alen Bleacher, NP 07/10/2023, 1:46 PM  VAD Team Pager [phone] (7am - 7am) +++VAD ISSUES ONLY+++  Advanced Heart Failure Team Pager [phone] (M-F; 7a - 5p)  Please contact [facility] for night-coverage after hours (5p -7a ) and weekends on [email or web address] for all non- LVAD Issues  Patient seen and examined with the above-signed Advanced Practice Provider and/or Housestaff. I personally reviewed laboratory data, imaging studies and relevant notes. I independently examined the patient and formulated the important aspects of the plan. I have edited the note to reflect any of my changes or salient points. I have personally discussed the plan with the patient and/or family.  58 y/o VAD patient admitted with several days of profound weakness, diarrhea and decreased urine output. Denies fevers. No bleeding  Admit labs with WBC 18.7, SCr  1.8 -> 3.5  Resp panel + Flu A MAPs 60s   General:  Weak appearing.  HEENT: normal  Neck: supple. JVP not elevated.  Carotids 2+ bilat; no bruits. No lymphadenopathy or thryomegaly appreciated. Cor: LVAD hum.  Lungs: Clear. Abdomen: soft, nontender, non-distended. No hepatosplenomegaly. No bruits or masses. Good bowel sounds. Driveline site clean. Anchor in place.  Extremities: no cyanosis, clubbing, rash. Warm no edema  Neuro: alert & oriented x 3. No focal deficits. Moves all 4 without problem   He is volume depleated and has AKI in setting of influenza A.   Will aggressively fluid replete. Treat with Tamiflu.   Can support MAPs with vasopressin as needed.   VAD interrogated personally. Parameters stable.  INR 2.0. Discussed warfarin dosing with PharmD personally.  Arvilla Meres, MD  6:23 PM

## 2023-07-10 NOTE — Progress Notes (Signed)
 Patient presents for sick visit in VAD Clinic today with David [name]) wife. Denies issues with VAD equipment or drive line.   Received page over the weekend reporting pt had diarrhea on Friday and interrmittent difficulty urinating Friday & Saturday which seemed to resolve. States they visited with their grandchildren on Friday, who ended up sick with Norovirus. Advised to notify VAD team if he continued to have difficulty with urination or felt poorly. Received call from [facility] this morning stating pt feeling unwell, unable to void, and diarrhea has restarted. Advised to come to clinic for evaluation.  Pt walked into clinic independently but appears wobbly. He is pale and diaphoretic. He is short of breath and tachypneic. O2 sat 87% on RA. Place on 3L [place]- O2 sat 97% Reports sinus pressure and drainage since last week. Pt with rattling cough. Reports has been coughing up thick green drainage over the weekend. Reports diarrhea reoccurred this morning. States he took his bowel regimen yesterday. Denies black of bloody stools. Denies blood in urine. Took 2 doses of Lasix over the weekend for bloating, with no change noted. Reports chills over the weekend, denies fever. Afebrile in clinic. Denies lightheadedness/dizziness other than on Saturday. Weight is down 4 lbs.   Right forearm 20 g PIV started by [name] RN.   Plan to admit to 2H per Dr [name].   Vital Signs: Temp: 98.2 oral Doppler Pressure: 70 Automatic BP: 68/57 (64)  repeat: 83/60 (69) HR: 100 NSR  SPO2: 87% RA -- placed on 3 L [place] -- 97%   Weight: 176.4 lb w/ eqt Last weight: 176.8 lb w/ eqt  VAD Indication: Destination therapy- CKD   VAD interrogation & Equipment Management: Speed: 5400 Flow: 5.4 Power: 4.1 w    PI: 2.1   Alarms: few LV Events: 0 - 10 daily  Fixed speed 5400 Low speed limit: 5100   Primary Controller:  Replace back up battery in 8 months Back up controller:  Replace back up battery in  months    Annual Equipment Maintenance on UBC/PM was performed on 03/27/2023.   I reviewed the LVAD parameters from today and compared the results to the patient's prior recorded data. LVAD interrogation was NEGATIVE for significant power changes, negative for clinical alarms and STABLE for PI events/speed drops. No programming changes were made and pump is functioning within specified parameters. Pt is performing daily controller and system monitor self tests along with completing weekly and monthly maintenance for LVAD equipment.   LVAD equipment check completed and is in good working order. Back up equipment present at this appointment.  Exit Site Care: Drive line is being maintained weekly by David [name], his wife. Exit site healed and incorporated, the velour is fully implanted at exit site. No redness, drainage, rash, or foul odor noted. Pt denies fever or chills. Pt has enough dressing supplies at home.   Device: [employer] Bi-V Therapies: VF 200 VT 167 Pacing:  VVI 50 Last check: 03/17/23  BP & Labs:  Doppler BP 70  - Doppler is reflecting modified systolic  Hgb 12.3 - No S/S of bleeding. Specifically denies melena/BRBPR or nosebleeds.  LDH pending with established baseline of 165- 250. Denies tea-colored urine. No power elevations noted on interrogation.    Patient Instructions:  Admit to 2H  Alyce Pagan RN VAD Coordinator  Office: [phone]  24/7 Pager: [phone]

## 2023-07-10 NOTE — Progress Notes (Signed)
 PHARMACY - ANTICOAGULATION CONSULT NOTE  Pharmacy Consult for Warfarin Indication:  LVAD HM3  Allergies  Allergen Reactions   Plavix [Clopidogrel Bisulfate] Hives    Patient Measurements:     Vital Signs: Temp: 98.2 F (36.8 C) (03/17 1139) BP: 77/54 (03/17 1300)  Labs: Recent Labs    07/10/23 1113  HGB 12.3*  HCT 38.3*  PLT 132*  LABPROT 23.3*  INR 2.0*  CREATININE 3.50*    Estimated Creatinine Clearance: 21.9 mL/min (A) (by C-G formula based on SCr of 3.5 mg/dL (H)).   Medical History: Past Medical History:  Diagnosis Date   AICD (automatic cardioverter/defibrillator) present    Anemia    Anxiety    CAD (coronary artery disease) 2009   AMI in 12/2007 with PCI to LAD, staged PCI to  mid/distal RCA, NSTEMI in 02/2009 with BMS to LCx   CHF (congestive heart failure) (HCC)    Chronic kidney disease 11/03/2016   stage 3 kidney disease   Dysrhythmia    "arrythmia problems at some point", "fatal rhythms"   History of blood transfusion    "I was bleeding from was rectum" (04/19/2017)   HLD (hyperlipidemia)    HTN (hypertension)    Hyperlipidemia 03/15/2011   Ischemic cardiomyopathy    Admitted in 07/2010 with CHF exacerbation   Myocardial infarction (HCC)    "I've had 4" (04/19/2017)   Pneumonia 2018 X 1   Seizures (HCC)    one seizure in 04/2016 during cardiac event (04/19/2017)   STEMI 2009 (anterior), 2010 (lateral), and 2012 (inferior) 03/14/2011     Assessment: 57yom with HF  s/p LVAD HM3 2019 - admitted with SOB weakness.  HGB 12 INR 2 at goal  Will give lower dose today with INR at top of range and not feeling well - re-evaluate in am  Warfarin PTA 2mg  MWF / 1mg  TTSS   Goal of Therapy:  INR 1.5-2 Monitor platelets by anticoagulation protocol: Yes   Plan:  Warfarin 1mg  x1 today Daily protime  Monitor s/s bleeding    Leota Sauers Pharm.D. CPP, BCPS Clinical Pharmacist [phone] 07/10/2023 2:48 PM

## 2023-07-11 ENCOUNTER — Inpatient Hospital Stay (HOSPITAL_COMMUNITY)

## 2023-07-11 DIAGNOSIS — R0602 Shortness of breath: Secondary | ICD-10-CM | POA: Diagnosis not present

## 2023-07-11 LAB — PROTIME-INR
INR: 2 — ABNORMAL HIGH (ref 0.8–1.2)
Prothrombin Time: 22.5 s — ABNORMAL HIGH (ref 11.4–15.2)

## 2023-07-11 LAB — CBC
HCT: 36.2 % — ABNORMAL LOW (ref 39.0–52.0)
Hemoglobin: 11.5 g/dL — ABNORMAL LOW (ref 13.0–17.0)
MCH: 25.1 pg — ABNORMAL LOW (ref 26.0–34.0)
MCHC: 31.8 g/dL (ref 30.0–36.0)
MCV: 78.9 fL — ABNORMAL LOW (ref 80.0–100.0)
Platelets: 126 10*3/uL — ABNORMAL LOW (ref 150–400)
RBC: 4.59 MIL/uL (ref 4.22–5.81)
RDW: 16 % — ABNORMAL HIGH (ref 11.5–15.5)
WBC: 12.3 10*3/uL — ABNORMAL HIGH (ref 4.0–10.5)
nRBC: 0 % (ref 0.0–0.2)

## 2023-07-11 LAB — BASIC METABOLIC PANEL
Anion gap: 14 (ref 5–15)
BUN: 33 mg/dL — ABNORMAL HIGH (ref 6–20)
CO2: 21 mmol/L — ABNORMAL LOW (ref 22–32)
Calcium: 8.4 mg/dL — ABNORMAL LOW (ref 8.9–10.3)
Chloride: 101 mmol/L (ref 98–111)
Creatinine, Ser: 2.24 mg/dL — ABNORMAL HIGH (ref 0.61–1.24)
GFR, Estimated: 33 mL/min — ABNORMAL LOW (ref >60)
Glucose, Bld: 106 mg/dL — ABNORMAL HIGH (ref 70–99)
Potassium: 3.9 mmol/L (ref 3.5–5.1)
Sodium: 136 mmol/L (ref 135–145)

## 2023-07-11 LAB — MRSA NEXT GEN BY PCR, NASAL: MRSA by PCR Next Gen: NOT DETECTED

## 2023-07-11 LAB — LACTATE DEHYDROGENASE: LDH: 238 U/L — ABNORMAL HIGH (ref 98–192)

## 2023-07-11 MED ORDER — WARFARIN SODIUM 1 MG PO TABS
1.0000 mg | ORAL_TABLET | Freq: Once | ORAL | Status: AC
  Administered 2023-07-11: 1 mg via ORAL
  Filled 2023-07-11: qty 1

## 2023-07-11 NOTE — Plan of Care (Signed)
 Problem: Education: Goal: Patient will understand all VAD equipment and how it functions 07/11/2023 0725 by Peyton Bottoms, RN Outcome: Progressing 07/11/2023 0725 by Peyton Bottoms, RN Outcome: Progressing 07/11/2023 0725 by Peyton Bottoms, RN Outcome: Progressing Goal: Patient will be able to verbalize current INR target range and antiplatelet therapy for discharge home 07/11/2023 0725 by Peyton Bottoms, RN Outcome: Progressing 07/11/2023 0725 by Peyton Bottoms, RN Outcome: Progressing 07/11/2023 0725 by Peyton Bottoms, RN Outcome: Progressing   Problem: Cardiac: Goal: LVAD will function as expected and patient will experience no clinical alarms 07/11/2023 0725 by Peyton Bottoms, RN Outcome: Progressing 07/11/2023 0725 by Peyton Bottoms, RN Outcome: Progressing 07/11/2023 0725 by Peyton Bottoms, RN Outcome: Progressing   Problem: Education: Goal: Knowledge of General Education information will improve Description: Including pain rating scale, medication(s)/side effects and non-pharmacologic comfort measures 07/11/2023 0725 by Peyton Bottoms, RN Outcome: Progressing 07/11/2023 0725 by Peyton Bottoms, RN Outcome: Progressing 07/11/2023 0725 by Peyton Bottoms, RN Outcome: Progressing   Problem: Health Behavior/Discharge Planning: Goal: Ability to manage health-related needs will improve 07/11/2023 0725 by Peyton Bottoms, RN Outcome: Progressing 07/11/2023 0725 by Peyton Bottoms, RN Outcome: Progressing 07/11/2023 0725 by Peyton Bottoms, RN Outcome: Progressing   Problem: Clinical Measurements: Goal: Ability to maintain clinical measurements within normal limits will improve 07/11/2023 0725 by Peyton Bottoms, RN Outcome: Progressing 07/11/2023 0725 by Peyton Bottoms, RN Outcome: Progressing 07/11/2023 0725 by Peyton Bottoms, RN Outcome: Progressing Goal: Will remain free from infection 07/11/2023 0725 by  Peyton Bottoms, RN Outcome: Progressing 07/11/2023 0725 by Peyton Bottoms, RN Outcome: Progressing 07/11/2023 0725 by Peyton Bottoms, RN Outcome: Progressing Goal: Diagnostic test results will improve 07/11/2023 0725 by Peyton Bottoms, RN Outcome: Progressing 07/11/2023 0725 by Peyton Bottoms, RN Outcome: Progressing 07/11/2023 0725 by Peyton Bottoms, RN Outcome: Progressing Goal: Respiratory complications will improve 07/11/2023 0725 by Peyton Bottoms, RN Outcome: Progressing 07/11/2023 0725 by Peyton Bottoms, RN Outcome: Progressing 07/11/2023 0725 by Peyton Bottoms, RN Outcome: Progressing Goal: Cardiovascular complication will be avoided 07/11/2023 0725 by Peyton Bottoms, RN Outcome: Progressing 07/11/2023 0725 by Peyton Bottoms, RN Outcome: Progressing 07/11/2023 0725 by Peyton Bottoms, RN Outcome: Progressing   Problem: Activity: Goal: Risk for activity intolerance will decrease 07/11/2023 0725 by Peyton Bottoms, RN Outcome: Progressing 07/11/2023 0725 by Peyton Bottoms, RN Outcome: Progressing 07/11/2023 0725 by Peyton Bottoms, RN Outcome: Progressing   Problem: Nutrition: Goal: Adequate nutrition will be maintained 07/11/2023 0725 by Peyton Bottoms, RN Outcome: Progressing 07/11/2023 0725 by Peyton Bottoms, RN Outcome: Progressing 07/11/2023 0725 by Peyton Bottoms, RN Outcome: Progressing   Problem: Coping: Goal: Level of anxiety will decrease 07/11/2023 0725 by Peyton Bottoms, RN Outcome: Progressing 07/11/2023 0725 by Peyton Bottoms, RN Outcome: Progressing 07/11/2023 0725 by Peyton Bottoms, RN Outcome: Progressing   Problem: Elimination: Goal: Will not experience complications related to bowel motility 07/11/2023 0725 by Peyton Bottoms, RN Outcome: Progressing 07/11/2023 0725 by Peyton Bottoms, RN Outcome: Progressing 07/11/2023 0725 by Peyton Bottoms, RN Outcome:  Progressing Goal: Will not experience complications related to urinary retention 07/11/2023 0725 by Peyton Bottoms, RN Outcome: Progressing 07/11/2023 0725 by Peyton Bottoms, RN Outcome: Progressing 07/11/2023 0725 by Peyton Bottoms, RN Outcome: Progressing   Problem: Pain Managment: Goal: General experience of comfort will improve and/or be controlled 07/11/2023 0725  by Peyton Bottoms, RN Outcome: Progressing 07/11/2023 0725 by Peyton Bottoms, RN Outcome: Progressing 07/11/2023 0725 by Peyton Bottoms, RN Outcome: Progressing   Problem: Safety: Goal: Ability to remain free from injury will improve 07/11/2023 0725 by Peyton Bottoms, RN Outcome: Progressing 07/11/2023 0725 by Peyton Bottoms, RN Outcome: Progressing 07/11/2023 0725 by Peyton Bottoms, RN Outcome: Progressing   Problem: Skin Integrity: Goal: Risk for impaired skin integrity will decrease 07/11/2023 0725 by Peyton Bottoms, RN Outcome: Progressing 07/11/2023 0725 by Peyton Bottoms, RN Outcome: Progressing 07/11/2023 0725 by Peyton Bottoms, RN Outcome: Progressing

## 2023-07-11 NOTE — Progress Notes (Addendum)
 Advanced Heart Failure VAD Team Note  PCP-Cardiologist: Arvilla Meres, MD   Subjective:    Received over 3L yesterday.  Also on Tamiflu  Scr 3.5 ->  2.2   INR 2.0   Had urinary retention overnight. Required straight cath with 800cc out. Now retaining again  Belly sore   LVAD INTERROGATION:  HeartMate 3 LVAD:   Flow 4.9 liters/min, speed 5400, power 4.0, PI 3.1.    Objective:    Vital Signs:   Temp:  [98.1 F (36.7 C)-99.7 F (37.6 C)] 98.2 F (36.8 C) (03/18 0900) Pulse Rate:  [29-208] 58 (03/18 0715) Resp:  [16-34] 22 (03/18 0715) BP: (37-140)/(11-120) 85/59 (03/18 0715) SpO2:  [84 %-100 %] 96 % (03/18 0715) Last BM Date : 07/10/23 Mean arterial Pressure 70s  Intake/Output:   Intake/Output Summary (Last 24 hours) at 07/11/2023 0917 Last data filed at 07/11/2023 0500 Gross per 24 hour  Intake 3398.63 ml  Output 1414 ml  Net 1984.63 ml     Physical Exam    General:  NAD.  HEENT: normal  Neck: supple. JVP not elevated.  Carotids 2+ bilat; no bruits. No lymphadenopathy or thryomegaly appreciated. Cor: LVAD hum.  Lungs: Clear. Abdomen: soft, mildly tender, + distended. No hepatosplenomegaly. No bruits or masses. Good bowel sounds. Driveline site clean. Anchor in place.  Extremities: no cyanosis, clubbing, rash. Warm no edema  Neuro: alert & oriented x 3. No focal deficits. Moves all 4 without problem   Telemetry   Sinus 50-60s Personally reviewed  Labs   Basic Metabolic Panel: Recent Labs  Lab 07/10/23 1113 07/11/23 0415  NA 131* 136  K 4.3 3.9  CL 100 101  CO2 19* 21*  GLUCOSE 113* 106*  BUN 37* 33*  CREATININE 3.50* 2.24*  CALCIUM 8.3* 8.4*  MG 1.8  --     Liver Function Tests: Recent Labs  Lab 07/10/23 1113  AST 55*  ALT 26  ALKPHOS 108  BILITOT 1.3*  PROT 7.5  ALBUMIN 2.5*   No results for input(s): "LIPASE", "AMYLASE" in the last 168 hours. No results for input(s): "AMMONIA" in the last 168 hours.  CBC: Recent Labs  Lab  07/10/23 1113 07/11/23 0415  WBC 18.9* 12.3*  HGB 12.3* 11.5*  HCT 38.3* 36.2*  MCV 78.2* 78.9*  PLT 132* 126*    INR: Recent Labs  Lab 07/10/23 1113 07/11/23 0415  INR 2.0* 2.0*    Other results:    Imaging   No results found.   Medications:     Scheduled Medications:  amiodarone  200 mg Oral Daily   atorvastatin  40 mg Oral Daily   Chlorhexidine Gluconate Cloth  6 each Topical Daily   multivitamin with minerals  1 tablet Oral Daily   oseltamivir  30 mg Oral BID   pantoprazole  40 mg Oral Daily   tamsulosin  0.4 mg Oral Daily   traZODone  50 mg Oral QHS   Warfarin - Pharmacist Dosing Inpatient   Does not apply q1600    Infusions:  vasopressin Stopped (07/10/23 2000)    PRN Medications: acetaminophen, polyethylene glycol, senna     Assessment/Plan:    1. Viral syndrome due to Influenza A infection - improving with IVF and Tamiflu  2. AKI due to volume depletion ATN in setting of #1 - improving with IVF - SCr 3.5 -> 2.2 (Baseline 1.5-1.8)  3. Hypotension - improved with volume resuscitation  4 . Chronic systolic CHF with HM III LVAD implant 04/20/17:  -  Stable. Volume status a above  4. VAD management - VAD interrogated personally. Parameters stable. - INR 2.0 Discussed warfarin dosing with PharmD personally. - LDH and DL ok   5. Hx of Ventricular tachycardia - Had ICD shock x 2 for VT on 03/23/23 (device reprogrammed at the time) - Had lead fracture. Device replaced 1/25 - No recent VT - Continue amio - Follows with EP  6. CAD:  s/p PCI RCA/PLOM with DES x2 and DES to mild LAD 1/18 - No s/s angina - Continue statin. Off ASA due to GIB  7. Microcytic anemia - check iron stores  8. Urinary retention - place Foley - start Flomax  I reviewed the LVAD parameters from today, and compared the results to the patient's prior recorded data.  No programming changes were made.  The LVAD is functioning within specified parameters.  The  patient performs LVAD self-test daily.  LVAD interrogation was negative for any significant power changes, alarms or PI events/speed drops.  LVAD equipment check completed and is in good working order.  Back-up equipment present.   LVAD education done on emergency procedures and precautions and reviewed exit site care.  Length of Stay: 1  Arvilla Meres, MD 07/11/2023, 9:17 AM  VAD Team --- VAD ISSUES ONLY--- Pager [phone] (7am - 7am)  Advanced Heart Failure Team  Pager [phone] (M-F; 7a - 5p)  Please contact [facility] for night-coverage after hours (5p -7a ) and weekends on [email or web address]

## 2023-07-11 NOTE — Plan of Care (Deleted)
  Problem: Education: Goal: Patient will understand all VAD equipment and how it functions 07/11/2023 0725 by Peyton Bottoms, RN Outcome: Progressing 07/11/2023 0725 by Peyton Bottoms, RN Outcome: Progressing Goal: Patient will be able to verbalize current INR target range and antiplatelet therapy for discharge home 07/11/2023 0725 by Peyton Bottoms, RN Outcome: Progressing 07/11/2023 0725 by Peyton Bottoms, RN Outcome: Progressing   Problem: Cardiac: Goal: LVAD will function as expected and patient will experience no clinical alarms 07/11/2023 0725 by Peyton Bottoms, RN Outcome: Progressing 07/11/2023 0725 by Peyton Bottoms, RN Outcome: Progressing   Problem: Education: Goal: Knowledge of General Education information will improve Description: Including pain rating scale, medication(s)/side effects and non-pharmacologic comfort measures 07/11/2023 0725 by Peyton Bottoms, RN Outcome: Progressing 07/11/2023 0725 by Peyton Bottoms, RN Outcome: Progressing   Problem: Health Behavior/Discharge Planning: Goal: Ability to manage health-related needs will improve 07/11/2023 0725 by Peyton Bottoms, RN Outcome: Progressing 07/11/2023 0725 by Peyton Bottoms, RN Outcome: Progressing   Problem: Clinical Measurements: Goal: Ability to maintain clinical measurements within normal limits will improve 07/11/2023 0725 by Peyton Bottoms, RN Outcome: Progressing 07/11/2023 0725 by Peyton Bottoms, RN Outcome: Progressing Goal: Will remain free from infection 07/11/2023 0725 by Peyton Bottoms, RN Outcome: Progressing 07/11/2023 0725 by Peyton Bottoms, RN Outcome: Progressing Goal: Diagnostic test results will improve 07/11/2023 0725 by Peyton Bottoms, RN Outcome: Progressing 07/11/2023 0725 by Peyton Bottoms, RN Outcome: Progressing Goal: Respiratory complications will improve 07/11/2023 0725 by Peyton Bottoms, RN Outcome:  Progressing 07/11/2023 0725 by Peyton Bottoms, RN Outcome: Progressing Goal: Cardiovascular complication will be avoided 07/11/2023 0725 by Peyton Bottoms, RN Outcome: Progressing 07/11/2023 0725 by Peyton Bottoms, RN Outcome: Progressing   Problem: Activity: Goal: Risk for activity intolerance will decrease 07/11/2023 0725 by Peyton Bottoms, RN Outcome: Progressing 07/11/2023 0725 by Peyton Bottoms, RN Outcome: Progressing   Problem: Nutrition: Goal: Adequate nutrition will be maintained 07/11/2023 0725 by Peyton Bottoms, RN Outcome: Progressing 07/11/2023 0725 by Peyton Bottoms, RN Outcome: Progressing   Problem: Coping: Goal: Level of anxiety will decrease 07/11/2023 0725 by Peyton Bottoms, RN Outcome: Progressing 07/11/2023 0725 by Peyton Bottoms, RN Outcome: Progressing   Problem: Elimination: Goal: Will not experience complications related to bowel motility 07/11/2023 0725 by Peyton Bottoms, RN Outcome: Progressing 07/11/2023 0725 by Peyton Bottoms, RN Outcome: Progressing Goal: Will not experience complications related to urinary retention 07/11/2023 0725 by Peyton Bottoms, RN Outcome: Progressing 07/11/2023 0725 by Peyton Bottoms, RN Outcome: Progressing   Problem: Pain Managment: Goal: General experience of comfort will improve and/or be controlled 07/11/2023 0725 by Peyton Bottoms, RN Outcome: Progressing 07/11/2023 0725 by Peyton Bottoms, RN Outcome: Progressing   Problem: Safety: Goal: Ability to remain free from injury will improve 07/11/2023 0725 by Peyton Bottoms, RN Outcome: Progressing 07/11/2023 0725 by Peyton Bottoms, RN Outcome: Progressing   Problem: Skin Integrity: Goal: Risk for impaired skin integrity will decrease 07/11/2023 0725 by Peyton Bottoms, RN Outcome: Progressing 07/11/2023 0725 by Peyton Bottoms, RN Outcome: Progressing

## 2023-07-11 NOTE — Progress Notes (Signed)
 LVAD Coordinator Rounding Note:  Admitted 07/11/23 following a sick visit in VAD Clinic. Pt paged on call VAD Coordinator this past Friday reporting feeling poorly after seeing his grandkids who ended up being sick with Norovirus.   HM3 LVAD implanted on 04/20/17 by Dr Laneta Simmers under destination therapy criteria.  Respiratory panel positive for Influenza A. Tamiflu started yesterday and pt received 3L LR for fluid resuscitation with increase in BP. Cr improved today 2.24. Increases urinary retention. Foley catheter placed today.  Vital signs: HR:80 Doppler Pressure:91/66 (76) O2 Sat: % on RA Wt:  lbs    LVAD interrogation reveals:  Speed: 5400 Flow: 5.0 Power: 4.0 w PI: 3.0   Alarms: none Events:  rare Hematocrit: 43  Fixed speed: 5400 Low speed limit: 5100  Drive Line: Existing dressing clean, dry, and intact. Anchor correctly applied. Weekly dressing maintained by pt's wife at home. Weekly dressing changes using weekly kit by bedside RN. Next dressing change due: 07/16/23.  Labs:  LDH trend: 264>238  INR trend: 2.0>2.0  Cr trend: 3.5>2.24  WBC trend: 18.9>12.3  Anticoagulation Plan: -INR Goal: 1.5-2.0 -ASA Dose: none  Device: -Medtronic BiV -Therapies: ON  Arrythmias: Hx Afib/AF  Plan/Recommendations:  1. Page VAD coordinator with any drive line or equipment concerns 2. Weekly drive line dressing changes by bedside RN  [name] RN,BSN VAD Coordinator  Office: [phone]  24/7 Pager: [phone]

## 2023-07-11 NOTE — Plan of Care (Deleted)
  Problem: Education: Goal: Patient will understand all VAD equipment and how it functions Outcome: Progressing Goal: Patient will be able to verbalize current INR target range and antiplatelet therapy for discharge home Outcome: Progressing   Problem: Cardiac: Goal: LVAD will function as expected and patient will experience no clinical alarms Outcome: Progressing   Problem: Education: Goal: Knowledge of General Education information will improve Description: Including pain rating scale, medication(s)/side effects and non-pharmacologic comfort measures Outcome: Progressing   Problem: Health Behavior/Discharge Planning: Goal: Ability to manage health-related needs will improve Outcome: Progressing   Problem: Clinical Measurements: Goal: Ability to maintain clinical measurements within normal limits will improve Outcome: Progressing Goal: Will remain free from infection Outcome: Progressing Goal: Diagnostic test results will improve Outcome: Progressing Goal: Respiratory complications will improve Outcome: Progressing Goal: Cardiovascular complication will be avoided Outcome: Progressing   Problem: Activity: Goal: Risk for activity intolerance will decrease Outcome: Progressing   Problem: Nutrition: Goal: Adequate nutrition will be maintained Outcome: Progressing   Problem: Coping: Goal: Level of anxiety will decrease Outcome: Progressing   Problem: Elimination: Goal: Will not experience complications related to bowel motility Outcome: Progressing Goal: Will not experience complications related to urinary retention Outcome: Progressing   Problem: Pain Managment: Goal: General experience of comfort will improve and/or be controlled Outcome: Progressing   Problem: Safety: Goal: Ability to remain free from injury will improve Outcome: Progressing   Problem: Skin Integrity: Goal: Risk for impaired skin integrity will decrease Outcome: Progressing

## 2023-07-11 NOTE — Progress Notes (Signed)
 PHARMACY - ANTICOAGULATION CONSULT NOTE  Pharmacy Consult for Warfarin Indication:  LVAD HM3  Allergies  Allergen Reactions   Plavix [Clopidogrel Bisulfate] Hives    Patient Measurements:     Vital Signs: Temp: 98.1 F (36.7 C) (03/18 0303) Temp Source: Oral (03/18 0303) BP: 85/59 (03/18 0715) Pulse Rate: 58 (03/18 0715)  Labs: Recent Labs    07/10/23 1113 07/11/23 0415  HGB 12.3* 11.5*  HCT 38.3* 36.2*  PLT 132* 126*  LABPROT 23.3* 22.5*  INR 2.0* 2.0*  CREATININE 3.50* 2.24*    Estimated Creatinine Clearance: 34.2 mL/min (A) (by C-G formula based on SCr of 2.24 mg/dL (H)).   Medical History: Past Medical History:  Diagnosis Date   AICD (automatic cardioverter/defibrillator) present    Anemia    Anxiety    CAD (coronary artery disease) 2009   AMI in 12/2007 with PCI to LAD, staged PCI to  mid/distal RCA, NSTEMI in 02/2009 with BMS to LCx   CHF (congestive heart failure) (HCC)    Chronic kidney disease 11/03/2016   stage 3 kidney disease   Dysrhythmia    "arrythmia problems at some point", "fatal rhythms"   History of blood transfusion    "I was bleeding from was rectum" (04/19/2017)   HLD (hyperlipidemia)    HTN (hypertension)    Hyperlipidemia 03/15/2011   Ischemic cardiomyopathy    Admitted in 07/2010 with CHF exacerbation   Myocardial infarction (HCC)    "I've had 4" (04/19/2017)   Pneumonia 2018 X 1   Seizures (HCC)    one seizure in 04/2016 during cardiac event (04/19/2017)   STEMI 2009 (anterior), 2010 (lateral), and 2012 (inferior) 03/14/2011     Assessment: 57yom with HF  s/p LVAD HM3 2019 - admitted with SOB weakness. On warfarin PTA 2mg  MWF / 1mg  TTSS   Hgb 11.5, INR 2 at goal. No signs of bleeding per RN. Patient still sleepy and hasn't eaten yet this morning.  Goal of Therapy:  INR 1.5-2 Monitor platelets by anticoagulation protocol: Yes   Plan:  Warfarin 1mg  x1 today Daily INR Monitor s/s bleeding   Thank you for involving  pharmacy in the patient's care.   Theotis Burrow, PharmD PGY1 Acute Care Pharmacy Resident  07/11/2023 10:54 AM

## 2023-07-12 ENCOUNTER — Other Ambulatory Visit: Payer: Self-pay

## 2023-07-12 ENCOUNTER — Encounter (HOSPITAL_COMMUNITY): Payer: Self-pay | Admitting: Internal Medicine

## 2023-07-12 DIAGNOSIS — R0602 Shortness of breath: Secondary | ICD-10-CM | POA: Diagnosis not present

## 2023-07-12 LAB — CBC
HCT: 36.9 % — ABNORMAL LOW (ref 39.0–52.0)
Hemoglobin: 11.7 g/dL — ABNORMAL LOW (ref 13.0–17.0)
MCH: 25.2 pg — ABNORMAL LOW (ref 26.0–34.0)
MCHC: 31.7 g/dL (ref 30.0–36.0)
MCV: 79.5 fL — ABNORMAL LOW (ref 80.0–100.0)
Platelets: 144 10*3/uL — ABNORMAL LOW (ref 150–400)
RBC: 4.64 MIL/uL (ref 4.22–5.81)
RDW: 16.3 % — ABNORMAL HIGH (ref 11.5–15.5)
WBC: 9.5 10*3/uL (ref 4.0–10.5)
nRBC: 0 % (ref 0.0–0.2)

## 2023-07-12 LAB — BASIC METABOLIC PANEL
Anion gap: 7 (ref 5–15)
BUN: 21 mg/dL — ABNORMAL HIGH (ref 6–20)
CO2: 26 mmol/L (ref 22–32)
Calcium: 8.8 mg/dL — ABNORMAL LOW (ref 8.9–10.3)
Chloride: 106 mmol/L (ref 98–111)
Creatinine, Ser: 1.64 mg/dL — ABNORMAL HIGH (ref 0.61–1.24)
GFR, Estimated: 48 mL/min — ABNORMAL LOW (ref >60)
Glucose, Bld: 105 mg/dL — ABNORMAL HIGH (ref 70–99)
Potassium: 5.2 mmol/L — ABNORMAL HIGH (ref 3.5–5.1)
Sodium: 139 mmol/L (ref 135–145)

## 2023-07-12 LAB — IRON AND TIBC
Iron: 11 ug/dL — ABNORMAL LOW (ref 45–182)
Saturation Ratios: 6 % — ABNORMAL LOW (ref 17.9–39.5)
TIBC: 190 ug/dL — ABNORMAL LOW (ref 250–450)
UIBC: 179 ug/dL

## 2023-07-12 LAB — PROTIME-INR
INR: 2.4 — ABNORMAL HIGH (ref 0.8–1.2)
Prothrombin Time: 26.6 s — ABNORMAL HIGH (ref 11.4–15.2)

## 2023-07-12 LAB — FERRITIN: Ferritin: 141 ng/mL (ref 24–336)

## 2023-07-12 LAB — LACTATE DEHYDROGENASE: LDH: 223 U/L — ABNORMAL HIGH (ref 98–192)

## 2023-07-12 MED ORDER — INFLUENZA VIRUS VACC SPLIT PF (FLUZONE) 0.5 ML IM SUSY
0.5000 mL | PREFILLED_SYRINGE | INTRAMUSCULAR | Status: AC | PRN
  Administered 2023-07-13: 0.5 mL via INTRAMUSCULAR
  Filled 2023-07-12: qty 0.5

## 2023-07-12 MED ORDER — ENSURE ENLIVE PO LIQD
237.0000 mL | Freq: Two times a day (BID) | ORAL | Status: DC
  Administered 2023-07-13: 237 mL via ORAL

## 2023-07-12 MED ORDER — SODIUM ZIRCONIUM CYCLOSILICATE 5 G PO PACK
5.0000 g | PACK | Freq: Once | ORAL | Status: AC
  Administered 2023-07-12: 5 g via ORAL
  Filled 2023-07-12: qty 1

## 2023-07-12 MED ORDER — IRON SUCROSE 200 MG IVPB - SIMPLE MED
200.0000 mg | Freq: Once | Status: AC
  Administered 2023-07-12: 200 mg via INTRAVENOUS
  Filled 2023-07-12: qty 200

## 2023-07-12 NOTE — Progress Notes (Addendum)
 Advanced Heart Failure VAD Team Note  PCP-Cardiologist: Arvilla Meres, MD   Subjective:    Volume repleted and now on tamiflu.   Scr 3.5 ->  2.2> 1.64  INR 2.4  Foley placed 3/18 for urinary retention.   Feels much better today. No longer having diarrhea. Has been getting up to the chair.    LVAD INTERROGATION:  HeartMate 3 LVAD:   Flow 4.9 liters/min, speed 5450, power 4.0, PI 3.1.    Objective:    Vital Signs:   Temp:  [98.1 F (36.7 C)-99 F (37.2 C)] 99 F (37.2 C) (03/18 2116) Pulse Rate:  [30-154] 32 (03/19 0630) Resp:  [12-31] 21 (03/19 0700) BP: (73-159)/(42-107) 86/75 (03/19 0700) SpO2:  [88 %-98 %] 95 % (03/19 0630) Weight:  [77 kg] 77 kg (03/19 0500) Last BM Date : 07/11/23 Mean arterial Pressure 70s-80s  Intake/Output:   Intake/Output Summary (Last 24 hours) at 07/12/2023 0854 Last data filed at 07/12/2023 0700 Gross per 24 hour  Intake 727 ml  Output 1950 ml  Net -1223 ml    Physical Exam  General:  Well appearing. No resp difficulty HEENT: Normal Neck: supple. JVP ~6.  Cor: Mechanical heart sounds with LVAD hum present. Lungs: Clear Abdomen: soft, nontender, nondistended. Good bowel sounds. Driveline: C/D/I; securement device intact and driveline incorporated Extremities: no cyanosis, clubbing, rash, edema Neuro: alert & oriented x3. Moves all 4 extremities w/o difficulty. Affect pleasant   Telemetry   NSR 80s (Personally reviewed)    Labs   Basic Metabolic Panel: Recent Labs  Lab 07/10/23 1113 07/11/23 0415 07/12/23 0350  NA 131* 136 139  K 4.3 3.9 5.2*  CL 100 101 106  CO2 19* 21* 26  GLUCOSE 113* 106* 105*  BUN 37* 33* 21*  CREATININE 3.50* 2.24* 1.64*  CALCIUM 8.3* 8.4* 8.8*  MG 1.8  --   --     Liver Function Tests: Recent Labs  Lab 07/10/23 1113  AST 55*  ALT 26  ALKPHOS 108  BILITOT 1.3*  PROT 7.5  ALBUMIN 2.5*   No results for input(s): "LIPASE", "AMYLASE" in the last 168 hours. No results for  input(s): "AMMONIA" in the last 168 hours.  CBC: Recent Labs  Lab 07/10/23 1113 07/11/23 0415 07/12/23 0350  WBC 18.9* 12.3* 9.5  HGB 12.3* 11.5* 11.7*  HCT 38.3* 36.2* 36.9*  MCV 78.2* 78.9* 79.5*  PLT 132* 126* 144*   INR: Recent Labs  Lab 07/10/23 1113 07/11/23 0415 07/12/23 0350  INR 2.0* 2.0* 2.4*   Other results:  Imaging   DG Abd Portable 1V Result Date: 07/11/2023 CLINICAL DATA:  Distended urinary bladder. EXAM: PORTABLE ABDOMEN - 1 VIEW COMPARISON:  None Available. FINDINGS: No bowel dilatation or evidence of obstruction. No free air. A left ventricular assist device noted. No acute osseous pathology. IMPRESSION: Nonobstructive bowel gas pattern. Electronically Signed   By: Elgie Collard M.D.   On: 07/11/2023 15:22   Medications:   Scheduled Medications:  amiodarone  200 mg Oral Daily   atorvastatin  40 mg Oral Daily   Chlorhexidine Gluconate Cloth  6 each Topical Daily   multivitamin with minerals  1 tablet Oral Daily   oseltamivir  30 mg Oral BID   pantoprazole  40 mg Oral Daily   tamsulosin  0.4 mg Oral Daily   traZODone  50 mg Oral QHS   Warfarin - Pharmacist Dosing Inpatient   Does not apply q1600    Infusions:    PRN Medications: acetaminophen,  polyethylene glycol, senna  Assessment/Plan:   1. Viral syndrome due to Influenza A infection - improving with IVF and Tamiflu  2. AKI due to volume depletion ATN in setting of #1 - improved with IVF - SCr 3.5 -> 2.2-> 1.64 (Baseline 1.5-1.8)  3. Hypotension - improved with volume resuscitation  4 . Chronic systolic CHF with HM III LVAD implant 04/20/17:  - Stable. Volume status a above  4. VAD management - VAD interrogated personally. Parameters stable. - INR 2.4 Discussed warfarin dosing with PharmD personally. - LDH and DL ok   5. Hx of Ventricular tachycardia - Had ICD shock x 2 for VT on 03/23/23 (device reprogrammed at the time) - Had lead fracture. Device replaced 1/25 - No recent  VT - Continue amio - Follows with EP  6. CAD:  s/p PCI RCA/PLOM with DES x2 and DES to mild LAD 1/18 - No s/s angina - Continue statin. Off ASA due to GIB  7. Microcytic anemia - tSat 6, ferritin 141 - Give Venofer today  8. Urinary retention - Continue Foley - Continue Flomax  9. Hyperkalemia - K 5.2 today - Give 5g Lokelma today  Orders placed yesterday for transfer to 2C. Plan to remove foley early tomorrow. If he voids without issue can d/c after.   I reviewed the LVAD parameters from today, and compared the results to the patient's prior recorded data.  No programming changes were made.  The LVAD is functioning within specified parameters.  The patient performs LVAD self-test daily.  LVAD interrogation was negative for any significant power changes, alarms or PI events/speed drops.  LVAD equipment check completed and is in good working order.  Back-up equipment present.   LVAD education done on emergency procedures and precautions and reviewed exit site care.  Length of Stay: 2  Alen Bleacher, NP 07/12/2023, 8:54 AM  VAD Team --- VAD ISSUES ONLY--- Pager [phone] (7am - 7am)  Advanced Heart Failure Team  Pager [phone] (M-F; 7a - 5p)  Please contact [facility] for night-coverage after hours (5p -7a ) and weekends on [email or web address]  Patient seen and examined with the above-signed Advanced Practice Provider and/or Housestaff. I personally reviewed laboratory data, imaging studies and relevant notes. I independently examined the patient and formulated the important aspects of the plan. I have edited the note to reflect any of my changes or salient points. I have personally discussed the plan with the patient and/or family.  Foley now in place. Feels much better. No further ab pain. Flu symptoms improved on Tamiflu. Getting IV iron for low Tsat   Scr back to baseline with IVF  General:  NAD.  HEENT: normal  Neck: supple. JVP not elevated.  Carotids 2+ bilat; no bruits. No  lymphadenopathy or thryomegaly appreciated. Cor: LVAD hum.  Lungs: Clear. Abdomen: obese soft, nontender, non-distended. No hepatosplenomegaly. No bruits or masses. Good bowel sounds. Driveline site clean. Anchor in place.  Extremities: no cyanosis, clubbing, rash. Warm no edema  Neuro: alert & oriented x 3. No focal deficits. Moves all 4 without problem   Improving steadily. Continue Tamiflu.   Keep Foley in. Continue Flomax. Voiding trial tomorrow.   Ambulate.  VAD interrogated personally. Parameters stable.  INR 2.4. Discussed warfarin dosing with PharmD personally.  Lokelma for Hyperkalemia  Arvilla Meres, MD  4:07 PM

## 2023-07-12 NOTE — TOC Initial Note (Signed)
 Transition of Care [facility]) - Initial/Assessment Note    Patient Details  Name: David [name] MRN: [id number] Date of Birth: 1965/05/27  Transition [facility]) CM/SW Contact:    Nicanor Bake Phone Number: [phone] 07/12/2023, 11:12 AM  Clinical Narrative:   HF CSW met with pt at bedside. Pt stated that he lives at home with wife. Pt stated that he has a history of HH services back in 2019, but could not remember the name of the agency. Pt stated that he does not use any equipment at home. Pt stated that he has a scale at home but it is outdated. Pt stated that he has a PCP. CSW explained that we typically schedule hospital follow up appts. Pt stated that he would like for his wife to reschedule his appt. Pt stated that he is not working. Pt stated that his wife will provide transportation at dc.   TOC will continue following.                  Expected Discharge Plan: Home/Self Care Barriers to Discharge: Continued Medical Work up   Patient Goals and CMS Choice            Expected Discharge Plan and Services       Living arrangements for the past 2 months: Apartment                                      Prior Living Arrangements/Services Living arrangements for the past 2 months: Apartment Lives with:: Spouse Patient language and need for interpreter reviewed:: Yes Do you feel safe going back to the place where you live?: Yes      Need for Family Participation in Patient Care: No (Comment) Care giver support system in place?: Yes (comment)   Criminal Activity/Legal Involvement Pertinent to Current Situation/Hospitalization: No - Comment as needed  Activities of Daily Living      Permission Sought/Granted                  Emotional Assessment Appearance:: Appears stated age Attitude/Demeanor/Rapport: Engaged Affect (typically observed): Appropriate Orientation: : Oriented to Self, Oriented to Place, Oriented to  Time, Oriented to  Situation Alcohol / Substance Use: Not Applicable Psych Involvement: No (comment)  Admission diagnosis:  SOB (shortness of breath) [R06.02] Patient Active Problem List   Diagnosis Date Noted   SOB (shortness of breath) 07/10/2023   Malfunction of implantable defibrillator ventricular (ICD) lead 05/24/2023   ICD (implantable cardioverter-defibrillator) lead failure 05/24/2023   Failure of implantable cardioverter-defibrillator (ICD) lead 05/24/2023   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 08/30/2022   SAH (subarachnoid hemorrhage) (HCC)    LVAD (left ventricular assist device) present (HCC)    ICH (intracerebral hemorrhage) (HCC) 06/05/2021   Left ventricular assist device present (HCC) 06/08/2017   Gastric AVM    Presence of left ventricular assist device (LVAD) (HCC) 04/20/2017   Palliative care encounter    Ischemic cardiomyopathy 11/07/2016   CKD (chronic kidney disease), stage III (HCC) 05/13/2016   PAF (paroxysmal atrial fibrillation) (HCC)    Chronic systolic heart failure (HCC) 03/15/2011   PCP:  Gordan Payment., MD Pharmacy:   [facility] [address] [place] - [address] [place] [address] Phone: [phone] Fax: [phone]  [facility] [address] [place] [address] Phone:  [phone] Fax: [phone]     Social Drivers of Health (SDOH) Social History: SDOH Screenings   Food Insecurity: No Food Insecurity (07/10/2023)  Recent Concern: Food Insecurity - Food Insecurity Present (05/24/2023)  Housing: Low Risk  (07/10/2023)  Transportation Needs: No Transportation Needs (07/10/2023)  Utilities: Not At Risk (07/10/2023)  Recent Concern: Utilities - At Risk (05/24/2023)  Depression (PHQ2-9): High Risk (07/19/2021)  Financial Resource Strain: High Risk (07/04/2022)  Physical Activity: Inactive (03/10/2017)  Stress: No Stress Concern Present (03/10/2017)  Tobacco Use:  Medium Risk (07/10/2023)   SDOH Interventions:     Readmission Risk Interventions     No data to display

## 2023-07-12 NOTE — Plan of Care (Signed)
  Problem: Education: Goal: Patient will understand all VAD equipment and how it functions Outcome: Progressing   Problem: Education: Goal: Knowledge of General Education information will improve Description: Including pain rating scale, medication(s)/side effects and non-pharmacologic comfort measures Outcome: Progressing   Problem: Clinical Measurements: Goal: Will remain free from infection Outcome: Progressing

## 2023-07-12 NOTE — Progress Notes (Signed)
 PHARMACY - ANTICOAGULATION CONSULT NOTE  Pharmacy Consult for Warfarin Indication:  LVAD HM3  Allergies  Allergen Reactions   Plavix [Clopidogrel Bisulfate] Hives    Patient Measurements: Height: 5\' 5"  (165.1 cm) Weight: 77 kg (169 lb 12.1 oz) IBW/kg (Calculated) : 61.5   Vital Signs: Temp: 99 F (37.2 C) (03/18 2116) Temp Source: Axillary (03/18 2116) BP: 86/75 (03/19 0700) Pulse Rate: 32 (03/19 0630)  Labs: Recent Labs    07/10/23 1113 07/11/23 0415 07/12/23 0350  HGB 12.3* 11.5* 11.7*  HCT 38.3* 36.2* 36.9*  PLT 132* 126* 144*  LABPROT 23.3* 22.5* 26.6*  INR 2.0* 2.0* 2.4*  CREATININE 3.50* 2.24* 1.64*    Estimated Creatinine Clearance: 46.4 mL/min (A) (by C-G formula based on SCr of 1.64 mg/dL (H)).   Medical History: Past Medical History:  Diagnosis Date   AICD (automatic cardioverter/defibrillator) present    Anemia    Anxiety    CAD (coronary artery disease) 2009   AMI in 12/2007 with PCI to LAD, staged PCI to  mid/distal RCA, NSTEMI in 02/2009 with BMS to LCx   CHF (congestive heart failure) (HCC)    Chronic kidney disease 11/03/2016   stage 3 kidney disease   Dysrhythmia    "arrythmia problems at some point", "fatal rhythms"   History of blood transfusion    "I was bleeding from was rectum" (04/19/2017)   HLD (hyperlipidemia)    HTN (hypertension)    Hyperlipidemia 03/15/2011   Ischemic cardiomyopathy    Admitted in 07/2010 with CHF exacerbation   Myocardial infarction (HCC)    "I've had 4" (04/19/2017)   Pneumonia 2018 X 1   Seizures (HCC)    one seizure in 04/2016 during cardiac event (04/19/2017)   STEMI 2009 (anterior), 2010 (lateral), and 2012 (inferior) 03/14/2011     Assessment: 57yom with HF  s/p LVAD HM3 2019 - admitted with SOB weakness. On warfarin PTA 2mg  MWF / 1mg  TTSS   Hgb 11s, plt 144. INR 2.4, above goal. No signs of bleeding noted. Last meal intake ~75%.   May discharge today: Notably is on tamiflu for another 2 days  which can interact with warfarin and increase bleeding risk. If discharges would recommend holding warfarin today and restarting home dose starting tomorrow with repeat INR on Monday per schedule.   Goal of Therapy:  INR 1.5-2 Monitor platelets by anticoagulation protocol: Yes   Plan:  Hold warfarin today in setting of elevated INR Daily INR Monitor s/s bleeding   Thank you for involving pharmacy in the patient's care.   Theotis Burrow, PharmD PGY1 Acute Care Pharmacy Resident  07/12/2023 8:44 AM

## 2023-07-12 NOTE — Progress Notes (Signed)
 LVAD Coordinator Rounding Note:  Admitted 07/11/23 following a sick visit in VAD Clinic. Pt paged on call VAD Coordinator this past Friday reporting feeling poorly after seeing his grandkids who ended up being sick with Norovirus.   HM3 LVAD implanted on 04/20/17 by Dr Laneta Simmers under destination therapy criteria.  Respiratory panel positive for Influenza A. Tamiflu started and pt received 3L LR for fluid resuscitation with increase in BP on admission. Cr improved today 1.64.   Pt lying in bed this morning states he is feeling better today. Foley remains in place. Pt states his appetite is improving.   Vital signs: HR:84 Doppler Pressure:103/83 (91) O2 Sat: 96% on RA Wt:  169.7 lbs    LVAD interrogation reveals:  Speed: 5450 Flow: 5.0 Power: 4.1 w PI: 3.5   Alarms: none Events:  rare Hematocrit: 43  Fixed speed: 5400 Low speed limit: 5100  Drive Line: Existing dressing clean, dry, and intact. Anchor correctly applied. Weekly dressing maintained by pt's wife at home. Weekly dressing changes using weekly kit by bedside RN. Next dressing change due: 07/16/23.  Labs:  LDH trend: 264>238>223  INR trend: 2.0>2.0>2.4  Cr trend: 3.5>2.24>1.64  WBC trend: 18.9>12.3>9.5  Anticoagulation Plan: -INR Goal: 1.5-2.0 -ASA Dose: none  Device: -Medtronic BiV -Therapies: ON  Arrythmias: Hx Afib/AF  Plan/Recommendations:  1. Page VAD coordinator with any drive line or equipment concerns 2. Weekly drive line dressing changes by bedside RN  [name] RN,BSN VAD Coordinator  Office: [phone]  24/7 Pager: [phone]

## 2023-07-12 NOTE — Plan of Care (Signed)
  Problem: Education: Goal: Patient will understand all VAD equipment and how it functions Outcome: Progressing Goal: Patient will be able to verbalize current INR target range and antiplatelet therapy for discharge home Outcome: Progressing   Problem: Cardiac: Goal: LVAD will function as expected and patient will experience no clinical alarms Outcome: Progressing   Problem: Education: Goal: Knowledge of General Education information will improve Description: Including pain rating scale, medication(s)/side effects and non-pharmacologic comfort measures Outcome: Progressing   Problem: Health Behavior/Discharge Planning: Goal: Ability to manage health-related needs will improve Outcome: Progressing   Problem: Clinical Measurements: Goal: Ability to maintain clinical measurements within normal limits will improve Outcome: Progressing Goal: Will remain free from infection Outcome: Progressing Goal: Diagnostic test results will improve Outcome: Progressing Goal: Respiratory complications will improve Outcome: Progressing Goal: Cardiovascular complication will be avoided Outcome: Progressing   Problem: Activity: Goal: Risk for activity intolerance will decrease Outcome: Progressing   Problem: Nutrition: Goal: Adequate nutrition will be maintained Outcome: Progressing   Problem: Coping: Goal: Level of anxiety will decrease Outcome: Progressing   Problem: Elimination: Goal: Will not experience complications related to bowel motility Outcome: Progressing Goal: Will not experience complications related to urinary retention Outcome: Progressing   Problem: Pain Managment: Goal: General experience of comfort will improve and/or be controlled Outcome: Progressing   Problem: Safety: Goal: Ability to remain free from injury will improve Outcome: Progressing   Problem: Skin Integrity: Goal: Risk for impaired skin integrity will decrease Outcome: Progressing

## 2023-07-13 DIAGNOSIS — R0602 Shortness of breath: Secondary | ICD-10-CM | POA: Diagnosis not present

## 2023-07-13 LAB — CBC
HCT: 37.4 % — ABNORMAL LOW (ref 39.0–52.0)
Hemoglobin: 11.7 g/dL — ABNORMAL LOW (ref 13.0–17.0)
MCH: 24.5 pg — ABNORMAL LOW (ref 26.0–34.0)
MCHC: 31.3 g/dL (ref 30.0–36.0)
MCV: 78.2 fL — ABNORMAL LOW (ref 80.0–100.0)
Platelets: 168 10*3/uL (ref 150–400)
RBC: 4.78 MIL/uL (ref 4.22–5.81)
RDW: 16.3 % — ABNORMAL HIGH (ref 11.5–15.5)
WBC: 6.8 10*3/uL (ref 4.0–10.5)
nRBC: 0 % (ref 0.0–0.2)

## 2023-07-13 LAB — BASIC METABOLIC PANEL
Anion gap: 8 (ref 5–15)
BUN: 16 mg/dL (ref 6–20)
CO2: 25 mmol/L (ref 22–32)
Calcium: 8.5 mg/dL — ABNORMAL LOW (ref 8.9–10.3)
Chloride: 104 mmol/L (ref 98–111)
Creatinine, Ser: 1.48 mg/dL — ABNORMAL HIGH (ref 0.61–1.24)
GFR, Estimated: 54 mL/min — ABNORMAL LOW (ref >60)
Glucose, Bld: 97 mg/dL (ref 70–99)
Potassium: 4.3 mmol/L (ref 3.5–5.1)
Sodium: 137 mmol/L (ref 135–145)

## 2023-07-13 LAB — LACTATE DEHYDROGENASE: LDH: 201 U/L — ABNORMAL HIGH (ref 98–192)

## 2023-07-13 LAB — PROTIME-INR
INR: 2.9 — ABNORMAL HIGH (ref 0.8–1.2)
Prothrombin Time: 30.5 s — ABNORMAL HIGH (ref 11.4–15.2)

## 2023-07-13 NOTE — TOC Transition Note (Addendum)
 Transition of Care [facility]) - Discharge Note   Patient Details  Name: VARICK KEYS MRN: [id number] Date of Birth: 04-04-1966  Transition [facility]) CM/SW Contact:  Elliot Cousin, RN Phone Number: [phone] 07/13/2023, 2:51 PM   Clinical Narrative:     PCP hospital follow up appt scheduled for 3/28 at 11 am. Wife will provide transportation home.   Final next level of care: Home/Self Care Barriers to Discharge: No Barriers Identified   Patient Goals and CMS Choice            Discharge Placement                       Discharge Plan and Services Additional resources added to the After Visit Summary for                                       Social Drivers of Health (SDOH) Interventions SDOH Screenings   Food Insecurity: No Food Insecurity (07/10/2023)  Recent Concern: Food Insecurity - Food Insecurity Present (05/24/2023)  Housing: Low Risk  (07/10/2023)  Transportation Needs: No Transportation Needs (07/10/2023)  Utilities: Not At Risk (07/10/2023)  Recent Concern: Utilities - At Risk (05/24/2023)  Depression (PHQ2-9): High Risk (07/19/2021)  Financial Resource Strain: High Risk (07/04/2022)  Physical Activity: Inactive (03/10/2017)  Stress: No Stress Concern Present (03/10/2017)  Tobacco Use: Medium Risk (07/12/2023)     Readmission Risk Interventions     No data to display

## 2023-07-13 NOTE — Progress Notes (Signed)
 LVAD Coordinator Rounding Note:  Admitted 07/11/23 following a sick visit in VAD Clinic. Pt paged on call VAD Coordinator this past Friday reporting feeling poorly after seeing his grandkids who were sick.  HM3 LVAD implanted on 04/20/17 by Dr Laneta Simmers under destination therapy criteria.  Respiratory panel positive for Influenza A. Tamiflu started and pt received 3L LR for fluid resuscitation with increase in BP on admission. Cr improved today 1.64.   Pt transferred to [facility]. Foley discontinued this morning. Pt has voided this morning. States he is continuing to feel better.   Vital signs: Temp: 98.6 HR:79 Doppler Pressure:86 Automatic BP: 102/83 (90) O2 Sat: 91% on 2L [place] Wt:  169.7>165 lbs    LVAD interrogation reveals:  Speed: 5400 Flow: 5.1 Power: 4.1 w PI: 2.7   Alarms: none Events:  rare Hematocrit: 43  Fixed speed: 5400 Low speed limit: 5100  Drive Line: Existing dressing clean, dry, and intact. Anchor correctly applied. Weekly dressing maintained by pt's wife at home. Weekly dressing changes using weekly kit by bedside RN. Next dressing change due: 07/16/23.  Labs:  LDH trend: 264>238>223>201  INR trend: 2.0>2.0>2.4>2.9  Cr trend: 3.5>2.24>1.64>1.48  WBC trend: 18.9>12.3>9.5>6.8  Anticoagulation Plan: -INR Goal: 1.5-2.0 -ASA Dose: none  Device: -Medtronic BiV -Therapies: ON  Arrythmias: Hx Afib/AF  Plan/Recommendations:  1. Page VAD coordinator with any drive line or equipment concerns 2. Weekly drive line dressing changes by bedside RN  [name] RN,BSN VAD Coordinator  Office: [phone]  24/7 Pager: [phone]

## 2023-07-13 NOTE — Progress Notes (Signed)
 Patient stated he didn't feel right but couldn't pin point the feeling. Put patient back on 2L of 02 and patient states that he does feel a little bit better after 30 min reassessment.

## 2023-07-13 NOTE — Progress Notes (Addendum)
 Advanced Heart Failure VAD Team Note  PCP-Cardiologist: Arvilla Meres, MD   Subjective:   Renal function back to baseline.   Denies SOB. Feels better.  LVAD INTERROGATION:  HeartMate 3 LVAD:   Flow 4.8 liters/min, speed 5400, power 3, PI 3.7 . No PI events.   Objective:    Vital Signs:   Temp:  [97.9 F (36.6 C)-98.4 F (36.9 C)] 98.2 F (36.8 C) (03/20 0550) Pulse Rate:  [30-118] 75 (03/20 0550) Resp:  [12-26] 17 (03/20 0550) BP: (70-109)/(54-91) 89/77 (03/20 0550) SpO2:  [77 %-96 %] 91 % (03/20 0550) Weight:  [74.8 kg] 74.8 kg (03/20 0500) Last BM Date : 07/12/23 Mean arterial Pressure 70-80s  Intake/Output:   Intake/Output Summary (Last 24 hours) at 07/13/2023 0719 Last data filed at 07/12/2023 1600 Gross per 24 hour  Intake --  Output 400 ml  Net -400 ml    Physical Exam  Physical Exam: GENERAL: No acute distress. HEENT: normal  NECK: Supple, JVP flat .  2+ bilaterally, no bruits.  No lymphadenopathy or thyromegaly appreciated.   CARDIAC:  Mechanical heart sounds with LVAD hum present.  LUNGS:  Clear to auscultation bilaterally.  ABDOMEN:  Soft, round, nontender, positive bowel sounds x4.     LVAD exit site:  Dressing dry and intact.  No erythema or drainage.  Stabilization device present and accurately applied.  EXTREMITIES:  Warm and dry, no cyanosis, clubbing, rash or edema  NEUROLOGIC:  Alert and oriented x 3.    No aphasia.  No dysarthria.  Affect pleasant.    GU: foley   Telemetry  SR 80s   Labs   Basic Metabolic Panel: Recent Labs  Lab 07/10/23 1113 07/11/23 0415 07/12/23 0350 07/13/23 0320  NA 131* 136 139 137  K 4.3 3.9 5.2* 4.3  CL 100 101 106 104  CO2 19* 21* 26 25  GLUCOSE 113* 106* 105* 97  BUN 37* 33* 21* 16  CREATININE 3.50* 2.24* 1.64* 1.48*  CALCIUM 8.3* 8.4* 8.8* 8.5*  MG 1.8  --   --   --     Liver Function Tests: Recent Labs  Lab 07/10/23 1113  AST 55*  ALT 26  ALKPHOS 108  BILITOT 1.3*  PROT 7.5  ALBUMIN  2.5*   No results for input(s): "LIPASE", "AMYLASE" in the last 168 hours. No results for input(s): "AMMONIA" in the last 168 hours.  CBC: Recent Labs  Lab 07/10/23 1113 07/11/23 0415 07/12/23 0350 07/13/23 0320  WBC 18.9* 12.3* 9.5 6.8  HGB 12.3* 11.5* 11.7* 11.7*  HCT 38.3* 36.2* 36.9* 37.4*  MCV 78.2* 78.9* 79.5* 78.2*  PLT 132* 126* 144* 168   INR: Recent Labs  Lab 07/10/23 1113 07/11/23 0415 07/12/23 0350 07/13/23 0320  INR 2.0* 2.0* 2.4* 2.9*   Other results:  Imaging   DG Abd Portable 1V Result Date: 07/11/2023 CLINICAL DATA:  Distended urinary bladder. EXAM: PORTABLE ABDOMEN - 1 VIEW COMPARISON:  None Available. FINDINGS: No bowel dilatation or evidence of obstruction. No free air. A left ventricular assist device noted. No acute osseous pathology. IMPRESSION: Nonobstructive bowel gas pattern. Electronically Signed   By: Elgie Collard M.D.   On: 07/11/2023 15:22   Medications:   Scheduled Medications:  amiodarone  200 mg Oral Daily   atorvastatin  40 mg Oral Daily   Chlorhexidine Gluconate Cloth  6 each Topical Daily   feeding supplement  237 mL Oral BID BM   multivitamin with minerals  1 tablet Oral Daily  oseltamivir  30 mg Oral BID   pantoprazole  40 mg Oral Daily   tamsulosin  0.4 mg Oral Daily   traZODone  50 mg Oral QHS   Warfarin - Pharmacist Dosing Inpatient   Does not apply q1600    Infusions:    PRN Medications: acetaminophen, influenza vac split trivalent PF, polyethylene glycol, senna  Assessment/Plan:   1. Viral syndrome due to Influenza A infection - improving with IVF and Tamiflu  2. AKI due to volume depletion ATN in setting of #1 - improved with IVF - SCr 3.5 on admit, back to his baseline today. (Baseline 1.5-1.8)  3. Hypotension - improved with volume resuscitation  4 . Chronic systolic CHF with HM III LVAD implant 04/20/17:  - Stable. Volume status a above -Continue to hold losartan and amlodipine.   4. VAD  management - VAD interrogated personally. Parameters stable. - INR 2.9. INR Goal 2-2.5. Will discuss with PharmD personally. - LDH stable.   5. Hx of Ventricular tachycardia - Had ICD shock x 2 for VT on 03/23/23 (device reprogrammed at the time) - Had lead fracture. Device replaced 1/25 - No recent VT - Continue amio - Follows with EP  6. CAD:  s/p PCI RCA/PLOM with DES x2 and DES to mild LAD 1/18 - No chest pain  - Continue statin. Off ASA due to GIB  7. Microcytic anemia - tSat 6, ferritin 141 - Got dose of Venofer   8. Urinary retention - Remove foley and see if he can void.  - Continue Flomax  9. Hyperkalemia - Now resolved after Lokelma.   Will follow up later today. If able to void should be able to go home.   I reviewed the LVAD parameters from today, and compared the results to the patient's prior recorded data.  No programming changes were made.  The LVAD is functioning within specified parameters.  The patient performs LVAD self-test daily.  LVAD interrogation was negative for any significant power changes, alarms or PI events/speed drops.  LVAD equipment check completed and is in good working order.  Back-up equipment present.   LVAD education done on emergency procedures and precautions and reviewed exit site care.  Length of Stay: 3  Tonye Becket, NP 07/13/2023, 7:19 AM  VAD Team --- VAD ISSUES ONLY--- Pager [phone] (7am - 7am)  Advanced Heart Failure Team  Pager [phone] (M-F; 7a - 5p)  Please contact [facility] for night-coverage after hours (5p -7a ) and weekends on [email or web address]  Patient seen and examined with the above-signed Advanced Practice Provider and/or Housestaff. I personally reviewed laboratory data, imaging studies and relevant notes. I independently examined the patient and formulated the important aspects of the plan. I have edited the note to reflect any of my changes or salient points. I have personally discussed the plan with the patient  and/or family.  Foley pulled out this am at 8am. Minimal urine output. Belly feels ok so far. No pain or distension   Flu symptoms resolved. Scr back to baseline.   INR 2.9. No bleeding  General:  NAD.  HEENT: normal  Neck: supple. JVP not elevated.  Carotids 2+ bilat; no bruits. No lymphadenopathy or thryomegaly appreciated. Cor: LVAD hum.  Lungs: Clear. Abdomen: soft, nontender, non-distended. No hepatosplenomegaly. No bruits or masses. Good bowel sounds. Driveline site clean. Anchor in place.  Extremities: no cyanosis, clubbing, rash. Warm no edema  Neuro: alert & oriented x 3. No focal deficits. Moves all 4 without problem  Continue to follow urine output and symptoms. If remains in retention will need to go home with Foley withoutpatient voiding trials.   Continue Tamiflu.   VAD interrogated personally. Parameters stable.  INR 2.9 Discussed warfarin dosing with PharmD personally.  Arvilla Meres, MD  12:00 PM

## 2023-07-13 NOTE — Discharge Summary (Signed)
 Advanced Heart Failure Team  Discharge Summary   Patient ID: David [name] MRN: [id number], DOB/AGE: [birth year] 58 y.o. Admit date: 07/10/2023 D/C date:     07/14/2023   Primary Discharge Diagnoses:  1. Viral syndrome due to Influenza A infection 2. AKI due to volume depletion ATN in setting of #1 3. Hypotension 4. Chronic systolic CHF with HM III LVAD implant 04/20/17:  5. VAD management 6. Microcytic anemia 7. Urinary retention 8. Hyperkalemia   Secondary Discharge Diagnoses:  H/O VT  H/O CAD   Hospital Course:   David [name] is a 58 y.o. male with history of ICD (09 PCI to LAD, mRCA, '10 PCI to Lcx and '12 mRCA), HFrEF (EF ~20% for > 5 years), HLD, HTN, and LVAD HM-III.   Admitted to ICU from LVAD clinic with increased shortness of breath, diarrhea, and hypotension. Volume depleted. WBC 18 and creatinine 3.5. Respiratory panel + Flu A. Given IV fluids with improvement and resolution of AKI.   Feels great today. Urinary retention resolved. MAPs ok. INR 2.7 No bleeding    LVAD Interrogation HM III:  Speed: 5400    Flow: 4.7      PI:   4.2   Power: 4.0      See below for detailed problem list.   1. Viral syndrome due to Influenza A infection - improving with IVF and Tamiflu - symptoms resolved   2. AKI due to volume depletion ATN in setting of #1 - improved with IVF - SCr 3.5 on admit - Resolved. (Back to Baseline 1.5-1.8)]   3. Hypotension - improved with volume resuscitation   4 . Chronic systolic CHF with HM III LVAD implant 04/20/17:  - Stable. Volume status a above - resume home meds   5. VAD management - VAD interrogated personally. Parameters stable. - INR 2.7 Goal 2-2.5. Discussed warfarin dosing with PharmD personally. - LDH stable   5. Hx of Ventricular tachycardia - Had ICD shock x 2 for VT on 03/23/23 (device reprogrammed at the time) - Had lead fracture. Device replaced 1/25 - No recent VT - Continue po amio - Follows with EP   6. CAD:   s/p PCI RCA/PLOM with DES x2 and DES to mild LAD 1/18 - No s/s angina - Continue statin. Off ASA due to GIB   7. Microcytic anemia - tSat 6, ferritin 141 - Got dose of Venofer    8. Urinary retention -  Now voiding well - Continue Flomax - Consider urology f/u   9. Hyperkalemia - Now resolved after Lokelma.  - K 4.0   Discharge Weight:  Discharge Vitals: Blood pressure 97/84, pulse 80, temperature 98.6 F (37 C), temperature source Oral, resp. rate (!) 23, height 5\' 5"  (1.651 m), weight 74.7 kg, SpO2 91%. General:  NAD.  HEENT: normal  Neck: supple. JVP not elevated.  Carotids 2+ bilat; no bruits. No lymphadenopathy or thryomegaly appreciated. Cor: LVAD hum.  Lungs: Clear. Abdomen: o soft, nontender, non-distended. No hepatosplenomegaly. No bruits or masses. Good bowel sounds. Driveline site clean. Anchor in place.  Extremities: no cyanosis, clubbing, rash. Warm no edema  Neuro: alert & oriented x 3. No focal deficits. Moves all 4 without problem    Labs: Lab Results  Component Value Date   WBC 7.7 07/14/2023   HGB 11.9 (L) 07/14/2023   HCT 37.3 (L) 07/14/2023   MCV 77.7 (L) 07/14/2023   PLT 228 07/14/2023    Recent Labs  Lab 07/10/23 1113 07/11/23 0415  07/14/23 0241  NA 131*   < > 138  K 4.3   < > 4.0  CL 100   < > 105  CO2 19*   < > 26  BUN 37*   < > 15  CREATININE 3.50*   < > 1.40*  CALCIUM 8.3*   < > 8.7*  PROT 7.5  --   --   BILITOT 1.3*  --   --   ALKPHOS 108  --   --   ALT 26  --   --   AST 55*  --   --   GLUCOSE 113*   < > 96   < > = values in this interval not displayed.   Lab Results  Component Value Date   CHOL 178 04/02/2018   HDL 42 04/02/2018   LDLCALC 111 (H) 04/02/2018   TRIG 124 04/02/2018   BNP (last 3 results) No results for input(s): "BNP" in the last 8760 hours.  ProBNP (last 3 results) No results for input(s): "PROBNP" in the last 8760 hours.   Diagnostic Studies/Procedures   No results found.  Discharge Medications    Allergies as of 07/14/2023       Reactions   Plavix [clopidogrel Bisulfate] Hives        Medication List     STOP taking these medications    losartan 50 MG tablet Commonly known as: COZAAR       TAKE these medications    acetaminophen 325 MG tablet Commonly known as: TYLENOL Take 2 tablets (650 mg total) by mouth every 4 (four) hours as needed for headache or mild pain.   amiodarone 200 MG tablet Commonly known as: PACERONE Take 1 tablet (200 mg total) by mouth daily.   amLODipine 5 MG tablet Commonly known as: NORVASC Take 1 tablet (5 mg total) by mouth daily.   atorvastatin 40 MG tablet Commonly known as: LIPITOR Take 1 tablet (40 mg total) by mouth daily.   diphenhydrAMINE 25 MG tablet Commonly known as: BENADRYL Take 25-50 mg by mouth every 6 (six) hours as needed for allergies.   fluticasone 50 MCG/ACT nasal spray Commonly known as: FLONASE Place 2 sprays into both nostrils daily. What changed:  when to take this reasons to take this   magnesium oxide 400 (240 Mg) MG tablet Commonly known as: MAG-OX Take 1 tablet (400 mg total) by mouth 2 (two) times daily.   multivitamin with minerals Tabs tablet Take 1 tablet by mouth daily.   pantoprazole 40 MG tablet Commonly known as: PROTONIX Take 1 tablet (40 mg total) by mouth daily.   polyethylene glycol 17 g packet Commonly known as: MIRALAX / GLYCOLAX Take 17 g by mouth daily. What changed:  when to take this reasons to take this   senna 8.6 MG Tabs tablet Commonly known as: SENOKOT Take 2 tablets (17.2 mg total) by mouth at bedtime. What changed:  when to take this reasons to take this   tamsulosin 0.4 MG Caps capsule Commonly known as: FLOMAX Take 1 capsule (0.4 mg total) by mouth daily.   traZODone 50 MG tablet Commonly known as: DESYREL Take 1 tablet (50 mg total) by mouth at bedtime.   warfarin 2 MG tablet Commonly known as: COUMADIN Take as directed. If you are unsure how to  take this medication, talk to your nurse or doctor. Original instructions: TAKE 1 MG 03/21 THEN 2 MG (1 TABLET) EVERY MON, WED, AND FRIDAY, AND 1 MG (1/2 TABLET) ALL  OTHER DAYS OR AS DIRECTED BY HF CLINIC What changed: additional instructions        Disposition   The patient will be discharged in stable condition to home. Discharge Instructions     Diet - low sodium heart healthy   Complete by: As directed    Heart Failure patients record your daily weight using the same scale at the same time of day   Complete by: As directed    Increase activity slowly   Complete by: As directed    Page VAD Coordinator at [phone]  Notify for: any VAD alarms, sustained elevations of power >10 watts, sustained drop in Pulse Index <3   Complete by: As directed    Notify for:  any VAD alarms sustained elevations of power >10 watts sustained drop in Pulse Index <3     STOP any activity that causes chest pain, shortness of breath, dizziness, sweating, or exessive weakness   Complete by: As directed        Follow-up Information     Gordan Payment., MD Follow up.   Specialty: Internal Medicine Why: 07/21/2023 11 am, you will see -Koren Bound NP (your provider is out of office currently) Contact information: [address] [place] [address] [phone]                   MD Duration of Discharge Encounter: 42 mins  Signed, Arvilla Meres  07/14/2023, 1:44 PM

## 2023-07-13 NOTE — Plan of Care (Signed)
  Problem: Education: Goal: Patient will understand all VAD equipment and how it functions Outcome: Progressing   Problem: Cardiac: Goal: LVAD will function as expected and patient will experience no clinical alarms Outcome: Progressing   Problem: Education: Goal: Knowledge of General Education information will improve Description: Including pain rating scale, medication(s)/side effects and non-pharmacologic comfort measures Outcome: Progressing   Problem: Clinical Measurements: Goal: Will remain free from infection Outcome: Progressing   Problem: Activity: Goal: Risk for activity intolerance will decrease Outcome: Progressing   Problem: Coping: Goal: Level of anxiety will decrease Outcome: Progressing

## 2023-07-13 NOTE — Progress Notes (Signed)
 Patient requested an Ensure as he couldn't stand the taste of the soup that was brought to his room for lunch.

## 2023-07-13 NOTE — Progress Notes (Signed)
 PHARMACY - ANTICOAGULATION CONSULT NOTE  Pharmacy Consult for Warfarin Indication:  LVAD HM3  Allergies  Allergen Reactions   Plavix [Clopidogrel Bisulfate] Hives    Patient Measurements: Height: 5\' 5"  (165.1 cm) Weight: 74.8 kg (165 lb) IBW/kg (Calculated) : 61.5   Vital Signs: Temp: 98.2 F (36.8 C) (03/20 0550) Temp Source: Oral (03/20 0550) BP: 89/77 (03/20 0550) Pulse Rate: 75 (03/20 0550)  Labs: Recent Labs    07/11/23 0415 07/12/23 0350 07/13/23 0320  HGB 11.5* 11.7* 11.7*  HCT 36.2* 36.9* 37.4*  PLT 126* 144* 168  LABPROT 22.5* 26.6* 30.5*  INR 2.0* 2.4* 2.9*  CREATININE 2.24* 1.64* 1.48*    Estimated Creatinine Clearance: 50.8 mL/min (A) (by C-G formula based on SCr of 1.48 mg/dL (H)).   Medical History: Past Medical History:  Diagnosis Date   AICD (automatic cardioverter/defibrillator) present    Anemia    Anxiety    CAD (coronary artery disease) 2009   AMI in 12/2007 with PCI to LAD, staged PCI to  mid/distal RCA, NSTEMI in 02/2009 with BMS to LCx   CHF (congestive heart failure) (HCC)    Chronic kidney disease 11/03/2016   stage 3 kidney disease   Dysrhythmia    "arrythmia problems at some point", "fatal rhythms"   History of blood transfusion    "I was bleeding from was rectum" (04/19/2017)   HLD (hyperlipidemia)    HTN (hypertension)    Hyperlipidemia 03/15/2011   Ischemic cardiomyopathy    Admitted in 07/2010 with CHF exacerbation   Myocardial infarction (HCC)    "I've had 4" (04/19/2017)   Pneumonia 2018 X 1   Seizures (HCC)    one seizure in 04/2016 during cardiac event (04/19/2017)   STEMI 2009 (anterior), 2010 (lateral), and 2012 (inferior) 03/14/2011     Assessment: 57yom with HF  s/p LVAD HM3 2019 - admitted with SOB weakness. On warfarin PTA 2mg  MWF / 1mg  TTSS with INR goal of 1.5-2.  INR on upper end of range on admit. Tamiflu scheduled for 4 more doses which can potentially increase INR. INR today is elevated at 2.9 despite  holding dose on 3/19. If discharged - hold warfarin tonight, resume 1mg  Sat/Sun (per home regimen) and check INR Monday with home machine.  Goal of Therapy:  INR 1.5-2 Monitor platelets by anticoagulation protocol: Yes   Plan:  Hold warfarin today  Daily INR See plan above if discharged  Fredonia Highland, PharmD, BCPS, [facility] Clinical Pharmacist [phone] Please check AMION for all [facility] numbers 07/13/2023

## 2023-07-14 LAB — CBC
HCT: 37.3 % — ABNORMAL LOW (ref 39.0–52.0)
Hemoglobin: 11.9 g/dL — ABNORMAL LOW (ref 13.0–17.0)
MCH: 24.8 pg — ABNORMAL LOW (ref 26.0–34.0)
MCHC: 31.9 g/dL (ref 30.0–36.0)
MCV: 77.7 fL — ABNORMAL LOW (ref 80.0–100.0)
Platelets: 228 10*3/uL (ref 150–400)
RBC: 4.8 MIL/uL (ref 4.22–5.81)
RDW: 16 % — ABNORMAL HIGH (ref 11.5–15.5)
WBC: 7.7 10*3/uL (ref 4.0–10.5)
nRBC: 0 % (ref 0.0–0.2)

## 2023-07-14 LAB — BASIC METABOLIC PANEL
Anion gap: 7 (ref 5–15)
BUN: 15 mg/dL (ref 6–20)
CO2: 26 mmol/L (ref 22–32)
Calcium: 8.7 mg/dL — ABNORMAL LOW (ref 8.9–10.3)
Chloride: 105 mmol/L (ref 98–111)
Creatinine, Ser: 1.4 mg/dL — ABNORMAL HIGH (ref 0.61–1.24)
GFR, Estimated: 58 mL/min — ABNORMAL LOW (ref >60)
Glucose, Bld: 96 mg/dL (ref 70–99)
Potassium: 4 mmol/L (ref 3.5–5.1)
Sodium: 138 mmol/L (ref 135–145)

## 2023-07-14 LAB — PROTIME-INR
INR: 2.7 — ABNORMAL HIGH (ref 0.8–1.2)
Prothrombin Time: 28.8 s — ABNORMAL HIGH (ref 11.4–15.2)

## 2023-07-14 LAB — LACTATE DEHYDROGENASE: LDH: 206 U/L — ABNORMAL HIGH (ref 98–192)

## 2023-07-14 MED ORDER — WARFARIN SODIUM 2 MG PO TABS: ORAL_TABLET | ORAL | Status: DC

## 2023-07-14 MED ORDER — OSELTAMIVIR PHOSPHATE 30 MG PO CAPS
30.0000 mg | ORAL_CAPSULE | Freq: Once | ORAL | Status: DC
  Filled 2023-07-14: qty 1

## 2023-07-14 MED ORDER — WARFARIN SODIUM 1 MG PO TABS
1.0000 mg | ORAL_TABLET | Freq: Once | ORAL | Status: DC
  Filled 2023-07-14: qty 1

## 2023-07-14 MED ORDER — ONDANSETRON HCL 4 MG PO TABS
4.0000 mg | ORAL_TABLET | Freq: Three times a day (TID) | ORAL | Status: DC | PRN
  Administered 2023-07-14: 4 mg via ORAL
  Filled 2023-07-14: qty 1

## 2023-07-14 NOTE — Progress Notes (Signed)
 LVAD Coordinator Rounding Note:  Admitted 07/11/23 following a sick visit in VAD Clinic. Pt paged on call VAD Coordinator this past Friday reporting feeling poorly after seeing his grandkids who were sick.  HM3 LVAD implanted on 04/20/17 by Dr Laneta Simmers under destination therapy criteria.  Respiratory panel positive for Influenza A. Tamiflu started and pt received 3L LR for fluid resuscitation with increase in BP on admission. Cr improved today 1.4.   Pt feels much better today. Will d/c home. Has f/u with DR Bensimhon 3/31.  Vital signs: Temp: 98.6 HR:82 Doppler Pressure:80 Automatic BP: 97/84 (90) O2 Sat: 91% on RA Wt:  169.7>165 >164.8lbs    LVAD interrogation reveals:  Speed: 5400 Flow: 4.8 Power: 4 w PI: 4   Alarms: none Events:  rare Hematocrit: 37  Fixed speed: 5400 Low speed limit: 5100  Drive Line: Existing dressing clean, dry, and intact. Anchor correctly applied. Weekly dressing maintained by pt's wife at home. Weekly dressing changes using weekly kit by bedside RN. Next dressing change due: 07/16/23.  Labs:  LDH trend: 264>238>223>201>206  INR trend: 2.0>2.0>2.4>2.9>2.7  Cr trend: 3.5>2.24>1.64>1.48>1.4  WBC trend: 18.9>12.3>9.5>6.8>7.7  Anticoagulation Plan: -INR Goal: 1.5-2.0 -ASA Dose: none  Device: -Medtronic BiV -Therapies: ON  Arrythmias: Hx Afib/AF  Plan/Recommendations:  1. Page VAD coordinator with any drive line or equipment concerns 2. Weekly drive line dressing changes by bedside RN  [name] RN,BSN VAD Coordinator  Office: [phone]  24/7 Pager: [phone]

## 2023-07-14 NOTE — Progress Notes (Signed)
 SATURATION QUALIFICATIONS: (This note is used to comply with regulatory documentation for home oxygen)  Patient Saturations on Room Air at Rest = 93%  Patient Saturations on Room Air while Ambulating = 92-93%  Patient Saturations on 0 Liters of oxygen while Ambulating 92-93%  Please briefly explain why patient needs home oxygen:

## 2023-07-14 NOTE — Progress Notes (Signed)
 Went over discharge paperwork with patient and wife. All questions answered. PIV/telemetry removed. All belongings at bedside.

## 2023-07-14 NOTE — Progress Notes (Signed)
 PHARMACY - ANTICOAGULATION CONSULT NOTE  Pharmacy Consult for Warfarin Indication:  LVAD HM3  Allergies  Allergen Reactions   Plavix [Clopidogrel Bisulfate] Hives    Patient Measurements: Height: 5\' 5"  (165.1 cm) Weight: 74.7 kg (164 lb 10.9 oz) IBW/kg (Calculated) : 61.5   Vital Signs: Temp: 98.6 F (37 C) (03/21 0721) Temp Source: Oral (03/21 0721) BP: 97/84 (03/21 0721) Pulse Rate: 80 (03/21 0721)  Labs: Recent Labs    07/12/23 0350 07/13/23 0320 07/14/23 0241  HGB 11.7* 11.7* 11.9*  HCT 36.9* 37.4* 37.3*  PLT 144* 168 228  LABPROT 26.6* 30.5* 28.8*  INR 2.4* 2.9* 2.7*  CREATININE 1.64* 1.48* 1.40*    Estimated Creatinine Clearance: 53.7 mL/min (A) (by C-G formula based on SCr of 1.4 mg/dL (H)).   Medical History: Past Medical History:  Diagnosis Date   AICD (automatic cardioverter/defibrillator) present    Anemia    Anxiety    CAD (coronary artery disease) 2009   AMI in 12/2007 with PCI to LAD, staged PCI to  mid/distal RCA, NSTEMI in 02/2009 with BMS to LCx   CHF (congestive heart failure) (HCC)    Chronic kidney disease 11/03/2016   stage 3 kidney disease   Dysrhythmia    "arrythmia problems at some point", "fatal rhythms"   History of blood transfusion    "I was bleeding from was rectum" (04/19/2017)   HLD (hyperlipidemia)    HTN (hypertension)    Hyperlipidemia 03/15/2011   Ischemic cardiomyopathy    Admitted in 07/2010 with CHF exacerbation   Myocardial infarction (HCC)    "I've had 4" (04/19/2017)   Pneumonia 2018 X 1   Seizures (HCC)    one seizure in 04/2016 during cardiac event (04/19/2017)   STEMI 2009 (anterior), 2010 (lateral), and 2012 (inferior) 03/14/2011     Assessment: 57yom with HF  s/p LVAD HM3 2019 - admitted with SOB weakness. On warfarin PTA 2mg  MWF / 1mg  TTSS with INR goal of 1.5-2.  INR above goal at 2.7 but trending down after holding doses x2. Pt likely to discharge today - will give 50% reduced dose tonight (1mg ) and  continue 1mg  over the weekend with INR check on Monday.  Goal of Therapy:  INR 1.5-2 Monitor platelets by anticoagulation protocol: Yes   Plan:  Warfarin 1mg   until INR check Monday Daily INR See plan above if discharged  Fredonia Highland, PharmD, BCPS, [facility] Clinical Pharmacist [phone] Please check AMION for all [facility] Pharmacy numbers 07/14/2023

## 2023-07-17 ENCOUNTER — Ambulatory Visit (HOSPITAL_COMMUNITY): Payer: Self-pay | Admitting: Pharmacist

## 2023-07-17 LAB — POCT INR: INR: 3.6 — AB (ref 2.0–3.0)

## 2023-07-19 ENCOUNTER — Other Ambulatory Visit (HOSPITAL_COMMUNITY): Payer: Self-pay

## 2023-07-19 DIAGNOSIS — Z7901 Long term (current) use of anticoagulants: Secondary | ICD-10-CM

## 2023-07-19 DIAGNOSIS — Z95811 Presence of heart assist device: Secondary | ICD-10-CM

## 2023-07-24 ENCOUNTER — Ambulatory Visit (HOSPITAL_COMMUNITY): Payer: Self-pay | Admitting: Pharmacist

## 2023-07-24 ENCOUNTER — Encounter (HOSPITAL_COMMUNITY): Admitting: Internal Medicine

## 2023-07-24 LAB — POCT INR: INR: 2.1 (ref 2.0–3.0)

## 2023-07-31 ENCOUNTER — Encounter (HOSPITAL_COMMUNITY): Admitting: Cardiology

## 2023-07-31 ENCOUNTER — Ambulatory Visit (HOSPITAL_COMMUNITY): Payer: Self-pay | Admitting: Pharmacist

## 2023-07-31 LAB — POCT INR: INR: 2.7 (ref 2.0–3.0)

## 2023-08-03 ENCOUNTER — Other Ambulatory Visit: Payer: Self-pay

## 2023-08-03 ENCOUNTER — Ambulatory Visit (HOSPITAL_BASED_OUTPATIENT_CLINIC_OR_DEPARTMENT_OTHER)
Admission: RE | Admit: 2023-08-03 | Discharge: 2023-08-03 | Disposition: A | Discharge status: Home / Self Care | Source: Ambulatory Visit | Attending: Cardiology | Admitting: Cardiology

## 2023-08-03 ENCOUNTER — Encounter (HOSPITAL_COMMUNITY): Payer: Self-pay | Admitting: Cardiology

## 2023-08-03 ENCOUNTER — Inpatient Hospital Stay (HOSPITAL_COMMUNITY)
Admission: AD | Admit: 2023-08-03 | Discharge: 2023-08-11 | DRG: 286 | Disposition: A | Discharge status: Home / Self Care | Source: Ambulatory Visit | Attending: Internal Medicine | Admitting: Internal Medicine

## 2023-08-03 ENCOUNTER — Ambulatory Visit (HOSPITAL_COMMUNITY)
Admission: RE | Admit: 2023-08-03 | Discharge: 2023-08-03 | Disposition: A | Discharge status: Home / Self Care | Source: Ambulatory Visit | Attending: Internal Medicine | Admitting: Internal Medicine

## 2023-08-03 ENCOUNTER — Encounter (HOSPITAL_COMMUNITY): Payer: Self-pay

## 2023-08-03 VITALS — BP 90/74 | HR 107 | Ht 65.0 in | Wt 164.6 lb

## 2023-08-03 DIAGNOSIS — Z8679 Personal history of other diseases of the circulatory system: Secondary | ICD-10-CM

## 2023-08-03 DIAGNOSIS — I609 Nontraumatic subarachnoid hemorrhage, unspecified: Secondary | ICD-10-CM | POA: Diagnosis present

## 2023-08-03 DIAGNOSIS — I5023 Acute on chronic systolic (congestive) heart failure: Secondary | ICD-10-CM | POA: Insufficient documentation

## 2023-08-03 DIAGNOSIS — I480 Paroxysmal atrial fibrillation: Secondary | ICD-10-CM | POA: Insufficient documentation

## 2023-08-03 DIAGNOSIS — N1831 Chronic kidney disease, stage 3a: Secondary | ICD-10-CM | POA: Insufficient documentation

## 2023-08-03 DIAGNOSIS — E861 Hypovolemia: Secondary | ICD-10-CM | POA: Diagnosis present

## 2023-08-03 DIAGNOSIS — R0602 Shortness of breath: Secondary | ICD-10-CM

## 2023-08-03 DIAGNOSIS — K31811 Angiodysplasia of stomach and duodenum with bleeding: Secondary | ICD-10-CM | POA: Diagnosis present

## 2023-08-03 DIAGNOSIS — D72829 Elevated white blood cell count, unspecified: Secondary | ICD-10-CM

## 2023-08-03 DIAGNOSIS — R140 Abdominal distension (gaseous): Secondary | ICD-10-CM | POA: Diagnosis present

## 2023-08-03 DIAGNOSIS — Z888 Allergy status to other drugs, medicaments and biological substances status: Secondary | ICD-10-CM

## 2023-08-03 DIAGNOSIS — Z7902 Long term (current) use of antithrombotics/antiplatelets: Secondary | ICD-10-CM | POA: Insufficient documentation

## 2023-08-03 DIAGNOSIS — I10 Essential (primary) hypertension: Secondary | ICD-10-CM | POA: Diagnosis not present

## 2023-08-03 DIAGNOSIS — Z87891 Personal history of nicotine dependence: Secondary | ICD-10-CM

## 2023-08-03 DIAGNOSIS — F419 Anxiety disorder, unspecified: Secondary | ICD-10-CM | POA: Diagnosis present

## 2023-08-03 DIAGNOSIS — J439 Emphysema, unspecified: Secondary | ICD-10-CM | POA: Diagnosis present

## 2023-08-03 DIAGNOSIS — J189 Pneumonia, unspecified organism: Secondary | ICD-10-CM | POA: Diagnosis present

## 2023-08-03 DIAGNOSIS — I2510 Atherosclerotic heart disease of native coronary artery without angina pectoris: Secondary | ICD-10-CM | POA: Insufficient documentation

## 2023-08-03 DIAGNOSIS — J9811 Atelectasis: Secondary | ICD-10-CM | POA: Diagnosis present

## 2023-08-03 DIAGNOSIS — Z8701 Personal history of pneumonia (recurrent): Secondary | ICD-10-CM

## 2023-08-03 DIAGNOSIS — Z7901 Long term (current) use of anticoagulants: Secondary | ICD-10-CM

## 2023-08-03 DIAGNOSIS — D509 Iron deficiency anemia, unspecified: Secondary | ICD-10-CM | POA: Diagnosis present

## 2023-08-03 DIAGNOSIS — I5022 Chronic systolic (congestive) heart failure: Secondary | ICD-10-CM | POA: Diagnosis not present

## 2023-08-03 DIAGNOSIS — Z4509 Encounter for adjustment and management of other cardiac device: Secondary | ICD-10-CM | POA: Insufficient documentation

## 2023-08-03 DIAGNOSIS — N179 Acute kidney failure, unspecified: Secondary | ICD-10-CM | POA: Diagnosis present

## 2023-08-03 DIAGNOSIS — Z95810 Presence of automatic (implantable) cardiac defibrillator: Secondary | ICD-10-CM | POA: Insufficient documentation

## 2023-08-03 DIAGNOSIS — I255 Ischemic cardiomyopathy: Secondary | ICD-10-CM | POA: Diagnosis present

## 2023-08-03 DIAGNOSIS — Z825 Family history of asthma and other chronic lower respiratory diseases: Secondary | ICD-10-CM

## 2023-08-03 DIAGNOSIS — Z955 Presence of coronary angioplasty implant and graft: Secondary | ICD-10-CM | POA: Insufficient documentation

## 2023-08-03 DIAGNOSIS — D62 Acute posthemorrhagic anemia: Secondary | ICD-10-CM | POA: Diagnosis present

## 2023-08-03 DIAGNOSIS — I493 Ventricular premature depolarization: Secondary | ICD-10-CM | POA: Diagnosis present

## 2023-08-03 DIAGNOSIS — R339 Retention of urine, unspecified: Secondary | ICD-10-CM | POA: Diagnosis present

## 2023-08-03 DIAGNOSIS — E785 Hyperlipidemia, unspecified: Secondary | ICD-10-CM | POA: Diagnosis present

## 2023-08-03 DIAGNOSIS — I4892 Unspecified atrial flutter: Secondary | ICD-10-CM | POA: Insufficient documentation

## 2023-08-03 DIAGNOSIS — I5043 Acute on chronic combined systolic (congestive) and diastolic (congestive) heart failure: Secondary | ICD-10-CM | POA: Diagnosis not present

## 2023-08-03 DIAGNOSIS — R300 Dysuria: Secondary | ICD-10-CM | POA: Diagnosis present

## 2023-08-03 DIAGNOSIS — Z95811 Presence of heart assist device: Secondary | ICD-10-CM | POA: Insufficient documentation

## 2023-08-03 DIAGNOSIS — I252 Old myocardial infarction: Secondary | ICD-10-CM

## 2023-08-03 DIAGNOSIS — I130 Hypertensive heart and chronic kidney disease with heart failure and stage 1 through stage 4 chronic kidney disease, or unspecified chronic kidney disease: Secondary | ICD-10-CM | POA: Insufficient documentation

## 2023-08-03 DIAGNOSIS — N183 Chronic kidney disease, stage 3 unspecified: Secondary | ICD-10-CM

## 2023-08-03 DIAGNOSIS — R918 Other nonspecific abnormal finding of lung field: Secondary | ICD-10-CM | POA: Diagnosis not present

## 2023-08-03 DIAGNOSIS — I472 Ventricular tachycardia, unspecified: Secondary | ICD-10-CM | POA: Insufficient documentation

## 2023-08-03 DIAGNOSIS — Z79899 Other long term (current) drug therapy: Secondary | ICD-10-CM

## 2023-08-03 DIAGNOSIS — Z833 Family history of diabetes mellitus: Secondary | ICD-10-CM

## 2023-08-03 DIAGNOSIS — J9601 Acute respiratory failure with hypoxia: Principal | ICD-10-CM | POA: Diagnosis present

## 2023-08-03 DIAGNOSIS — Z8249 Family history of ischemic heart disease and other diseases of the circulatory system: Secondary | ICD-10-CM

## 2023-08-03 DIAGNOSIS — I5041 Acute combined systolic (congestive) and diastolic (congestive) heart failure: Secondary | ICD-10-CM | POA: Diagnosis not present

## 2023-08-03 LAB — HEPATIC FUNCTION PANEL
ALT: 16 U/L (ref 0–44)
AST: 22 U/L (ref 15–41)
Albumin: 2.8 g/dL — ABNORMAL LOW (ref 3.5–5.0)
Alkaline Phosphatase: 102 U/L (ref 38–126)
Bilirubin, Direct: 0.4 mg/dL — ABNORMAL HIGH (ref 0.0–0.2)
Indirect Bilirubin: 1.2 mg/dL — ABNORMAL HIGH (ref 0.3–0.9)
Total Bilirubin: 1.6 mg/dL — ABNORMAL HIGH (ref 0.0–1.2)
Total Protein: 8.3 g/dL — ABNORMAL HIGH (ref 6.5–8.1)

## 2023-08-03 LAB — CBC
HCT: 41.6 % (ref 39.0–52.0)
Hemoglobin: 13.1 g/dL (ref 13.0–17.0)
MCH: 24.8 pg — ABNORMAL LOW (ref 26.0–34.0)
MCHC: 31.5 g/dL (ref 30.0–36.0)
MCV: 78.8 fL — ABNORMAL LOW (ref 80.0–100.0)
Platelets: 193 10*3/uL (ref 150–400)
RBC: 5.28 MIL/uL (ref 4.22–5.81)
RDW: 17 % — ABNORMAL HIGH (ref 11.5–15.5)
WBC: 11.5 10*3/uL — ABNORMAL HIGH (ref 4.0–10.5)
nRBC: 0 % (ref 0.0–0.2)

## 2023-08-03 LAB — BRAIN NATRIURETIC PEPTIDE: B Natriuretic Peptide: 470.3 pg/mL — ABNORMAL HIGH (ref 0.0–100.0)

## 2023-08-03 LAB — BASIC METABOLIC PANEL WITH GFR
Anion gap: 11 (ref 5–15)
BUN: 16 mg/dL (ref 6–20)
CO2: 22 mmol/L (ref 22–32)
Calcium: 9.2 mg/dL (ref 8.9–10.3)
Chloride: 102 mmol/L (ref 98–111)
Creatinine, Ser: 1.85 mg/dL — ABNORMAL HIGH (ref 0.61–1.24)
GFR, Estimated: 41 mL/min — ABNORMAL LOW (ref >60)
Glucose, Bld: 114 mg/dL — ABNORMAL HIGH (ref 70–99)
Potassium: 3.7 mmol/L (ref 3.5–5.1)
Sodium: 135 mmol/L (ref 135–145)

## 2023-08-03 LAB — PROTIME-INR
INR: 2.8 — ABNORMAL HIGH (ref 0.8–1.2)
Prothrombin Time: 30 s — ABNORMAL HIGH (ref 11.4–15.2)

## 2023-08-03 LAB — LACTATE DEHYDROGENASE: LDH: 197 U/L — ABNORMAL HIGH (ref 98–192)

## 2023-08-03 MED ORDER — AMIODARONE HCL 200 MG PO TABS
200.0000 mg | ORAL_TABLET | Freq: Every day | ORAL | Status: DC
  Administered 2023-08-04 – 2023-08-07 (×4): 200 mg via ORAL
  Filled 2023-08-03 (×4): qty 1

## 2023-08-03 MED ORDER — MAGNESIUM OXIDE -MG SUPPLEMENT 400 (240 MG) MG PO TABS
400.0000 mg | ORAL_TABLET | Freq: Two times a day (BID) | ORAL | Status: DC
  Administered 2023-08-03 – 2023-08-11 (×16): 400 mg via ORAL
  Filled 2023-08-03 (×16): qty 1

## 2023-08-03 MED ORDER — PANTOPRAZOLE SODIUM 40 MG PO TBEC
40.0000 mg | DELAYED_RELEASE_TABLET | Freq: Every day | ORAL | Status: DC
  Administered 2023-08-04 – 2023-08-11 (×8): 40 mg via ORAL
  Filled 2023-08-03 (×8): qty 1

## 2023-08-03 MED ORDER — FUROSEMIDE 10 MG/ML IJ SOLN
120.0000 mg | Freq: Two times a day (BID) | INTRAVENOUS | Status: DC
  Administered 2023-08-03 – 2023-08-05 (×4): 120 mg via INTRAVENOUS
  Filled 2023-08-03: qty 12
  Filled 2023-08-03 (×4): qty 10

## 2023-08-03 MED ORDER — AMLODIPINE BESYLATE 5 MG PO TABS
5.0000 mg | ORAL_TABLET | Freq: Every day | ORAL | Status: DC
  Administered 2023-08-04 – 2023-08-11 (×8): 5 mg via ORAL
  Filled 2023-08-03 (×8): qty 1

## 2023-08-03 MED ORDER — TRAZODONE HCL 50 MG PO TABS
50.0000 mg | ORAL_TABLET | Freq: Every day | ORAL | Status: DC
  Administered 2023-08-03 – 2023-08-10 (×8): 50 mg via ORAL
  Filled 2023-08-03 (×8): qty 1

## 2023-08-03 MED ORDER — ATORVASTATIN CALCIUM 40 MG PO TABS
40.0000 mg | ORAL_TABLET | Freq: Every day | ORAL | Status: DC
  Administered 2023-08-04 – 2023-08-11 (×8): 40 mg via ORAL
  Filled 2023-08-03 (×8): qty 1

## 2023-08-03 MED ORDER — SODIUM CHLORIDE 0.9 % IV SOLN
500.0000 mg | Freq: Once | INTRAVENOUS | Status: AC
  Administered 2023-08-03: 500 mg via INTRAVENOUS
  Filled 2023-08-03 (×2): qty 5

## 2023-08-03 MED ORDER — ACETAMINOPHEN 325 MG PO TABS: 650.0000 mg | ORAL_TABLET | ORAL | Status: DC | PRN

## 2023-08-03 MED ORDER — ONDANSETRON HCL 4 MG/2ML IJ SOLN: 4.0000 mg | Freq: Four times a day (QID) | INTRAMUSCULAR | Status: DC | PRN

## 2023-08-03 MED ORDER — SODIUM CHLORIDE 0.9 % IV SOLN
2.0000 g | INTRAVENOUS | Status: DC
  Administered 2023-08-03: 2 g via INTRAVENOUS
  Filled 2023-08-03 (×2): qty 20

## 2023-08-03 MED ORDER — FUROSEMIDE 10 MG/ML IJ SOLN
40.0000 mg | Freq: Once | INTRAMUSCULAR | Status: AC
  Administered 2023-08-03: 40 mg via INTRAVENOUS

## 2023-08-03 MED ORDER — WARFARIN - PHARMACIST DOSING INPATIENT: Freq: Every day | Status: DC

## 2023-08-03 MED ORDER — POTASSIUM CHLORIDE CRYS ER 20 MEQ PO TBCR
20.0000 meq | EXTENDED_RELEASE_TABLET | Freq: Once | ORAL | Status: AC
  Administered 2023-08-03: 20 meq via ORAL
  Filled 2023-08-03: qty 1

## 2023-08-03 MED ORDER — SODIUM CHLORIDE 0.9 % IV SOLN
2.0000 g | INTRAVENOUS | Status: DC
  Administered 2023-08-04 – 2023-08-06 (×3): 2 g via INTRAVENOUS
  Filled 2023-08-03 (×3): qty 20

## 2023-08-03 MED ORDER — TAMSULOSIN HCL 0.4 MG PO CAPS
0.4000 mg | ORAL_CAPSULE | Freq: Every day | ORAL | Status: DC
  Administered 2023-08-04 – 2023-08-11 (×8): 0.4 mg via ORAL
  Filled 2023-08-03 (×8): qty 1

## 2023-08-03 MED ORDER — SODIUM CHLORIDE 0.9 % IV SOLN
500.0000 mg | INTRAVENOUS | Status: DC
  Administered 2023-08-04 – 2023-08-06 (×3): 500 mg via INTRAVENOUS
  Filled 2023-08-03 (×4): qty 5

## 2023-08-03 NOTE — Progress Notes (Signed)
 PHARMACY - ANTICOAGULATION CONSULT NOTE  Pharmacy Consult for warfarin Indication:  LVAD  Allergies  Allergen Reactions   Plavix [Clopidogrel Bisulfate] Hives    Patient Measurements:    Vital Signs: Temp: 98.5 F (36.9 C) (04/10 1531) Temp Source: Oral (04/10 1531) BP: 89/56 (04/10 1531) Pulse Rate: 91 (04/10 1531)  Labs: Recent Labs    08/03/23 0904  HGB 13.1  HCT 41.6  PLT 193  LABPROT 30.0*  INR 2.8*  CREATININE 1.85*    Estimated Creatinine Clearance: 40.6 mL/min (A) (by C-G formula based on SCr of 1.85 mg/dL (H)).   Medical History: Past Medical History:  Diagnosis Date   AICD (automatic cardioverter/defibrillator) present    Anemia    Anxiety    CAD (coronary artery disease) 2009   AMI in 12/2007 with PCI to LAD, staged PCI to  mid/distal RCA, NSTEMI in 02/2009 with BMS to LCx   CHF (congestive heart failure) (HCC)    Chronic kidney disease 11/03/2016   stage 3 kidney disease   Dysrhythmia    "arrythmia problems at some point", "fatal rhythms"   History of blood transfusion    "I was bleeding from was rectum" (04/19/2017)   HLD (hyperlipidemia)    HTN (hypertension)    Hyperlipidemia 03/15/2011   Ischemic cardiomyopathy    Admitted in 07/2010 with CHF exacerbation   Myocardial infarction (HCC)    "I've had 4" (04/19/2017)   Pneumonia 2018 X 1   Seizures (HCC)    one seizure in 04/2016 during cardiac event (04/19/2017)   STEMI 2009 (anterior), 2010 (lateral), and 2012 (inferior) 03/14/2011    Medications:  Scheduled:   [START ON 08/04/2023] amiodarone  200 mg Oral Daily   [START ON 08/04/2023] amLODipine  5 mg Oral Daily   [START ON 08/04/2023] atorvastatin  40 mg Oral Daily   magnesium oxide  400 mg Oral BID   [START ON 08/04/2023] pantoprazole  40 mg Oral Daily   [START ON 08/04/2023] tamsulosin  0.4 mg Oral Daily   traZODone  50 mg Oral QHS    Assessment: 57 yom presenting with hypoxia. Hx of LVAD HM3 in 2019 - on warfarin PTA. Last dose on  4/8 - PTA regimen is 1 mg daily except 2 mg MWF.   INR is supratherapeutic at 2.8. Hgb 13.1, plt 193. LDH 197. No s/sx of bleeding.   Goal of Therapy:  INR 1.5-2 Monitor platelets by anticoagulation protocol: Yes   Plan:  Hold warfarin  Monitor daily INR, CBC, and for s/sx of bleeding   Thank you for allowing pharmacy to participate in this patient's care,  Sherron Monday, PharmD, BCCCP Clinical Pharmacist  Phone: [phone] 08/03/2023 4:52 PM  Please check AMION for all [facility] phone numbers After 10:00 PM, call Main Pharmacy [phone]

## 2023-08-03 NOTE — Progress Notes (Signed)
 Patient presents for hosp f/u in VAD Clinic today with Vikki Ports) wife. Denies issues with VAD equipment or drive line.   Pt walked into clinic independently. He is pale and diaphoretic. He is short of breath and tachypneic. O2 sat 78% on RA. Place on 2L [place]- O2 sat 87% Increased O2 to 3L/[place] sat 90%. Reports dry cough. Wife states he has had difficulty breathing at times and feels "bloated." Took Lasix 20 mg Tues, Wed and Today.  Afebrile in clinic.   Chest xray obtained.  Right forearm 20 g PIV started by [name] RN. 40 mg IV lasix given. IV antibiotics administered in clinic - see MAR.   Vital Signs: Temp: 98.7 oral Doppler Pressure: 90 Automatic BP: 90/74 (81) HR: 107 NSR  SPO2: 90% on 3L/[place]   Weight: 164.6 lb w/ eqt Last weight: 172 lb w/ eqt  VAD Indication: Destination therapy- CKD   VAD interrogation & Equipment Management: Speed: 5400 Flow: 5.3 Power: 4.2 w    PI: 2.6   Alarms: few LV Events: rare  Fixed speed 5400 Low speed limit: 5100   Primary Controller:  Replace back up battery in 7 months Back up controller:  Replace back up battery in 4 months; Expired. Replaced with [id number]. Replace in 30 mths   Annual Equipment Maintenance on UBC/PM was performed on 03/27/2023.   I reviewed the LVAD parameters from today and compared the results to the patient's prior recorded data. LVAD interrogation was NEGATIVE for significant power changes, negative for clinical alarms and STABLE for PI events/speed drops. No programming changes were made and pump is functioning within specified parameters. Pt is performing daily controller and system monitor self tests along with completing weekly and monthly maintenance for LVAD equipment.   LVAD equipment check completed and is in good working order. Back up equipment present at this appointment.  Exit Site Care: Drive line is being maintained weekly by Vikki Ports, his wife. Exit site healed and incorporated, the velour is fully  implanted at exit site. No redness, drainage, rash, or foul odor noted. Pt denies fever or chills. Pt given 8 weekly dressing kits for home use.  Device: [employer] Bi-V Therapies: VF 200 VT 167 Pacing:  VVI 50 Last check: 03/17/23  BP & Labs:  Doppler BP 90  - Doppler is reflecting modified systolic  Hgb 13.1 - No S/S of bleeding. Specifically denies melena/BRBPR or nosebleeds.  LDH 197 with established baseline of 165- 250. Denies tea-colored urine. No power elevations noted on interrogation.    Patient Instructions:  Admit to 2C   [name] RN VAD Coordinator  Office: [phone]  24/7 Pager: [phone]

## 2023-08-03 NOTE — Progress Notes (Signed)
 LVAD Clinic Follow Up Note HF MD: Dr [name]  HPI: David [name] is a 58 y.o. male with history of ICD (09 PCI to LAD, mRCA, '10 PCI to Lcx and '12 mRCA), HFrEF (EF ~20% for > 5 years), HLD and HTN. S/P LVAD HM-III 04/20/18.    Admitted 04/19/17 - 05/27/2017 for scheduled LVAD implant. On 12/27 he underwent HMIII LVAD implant. Hospital course c/b PNA shock liver and AKI. Required short term HD.  Readmitted 05/30/17 hgb 3.8. INR 2.9.transfused 4u pRBCs. 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 Underwent ventriculostomy. Admission c/b by periods of confusion and urinary retention. Discharged to CIR  Was seen for acute visit on 03/27/23 for ICD firing x 2 on Thanksgiving.. Electrolytes ok. Seen by Dr. Graciela Husbands and ICD reprogrammed. Found to have lead fracture  Underwent lead extraction and placement of new ICD system in 1/25. C/p small chest hematoma. Now resolved  Recently admitted to the hospital 06/2023 for worsening shortness of breath, diarrhea, and influenza A positive.  He received multiple liters of fluid with improvement in his symptoms.  Course complicated by some urinary retention.  He was discharged in stable condition.  Patient is here for follow-up with his wife.  A few days ago she noticed that he was starting to have some abdominal bloating.  He also was becoming more short of breath and she noticed on multiple times sitting on the side of the bed, tripoding, struggling to breathe.  Given the bloating she has had him take some of her 20 mg of Lasix with mild improvement in his symptoms.  On arrival to clinic he was noted to be satting in the 70s and had to be placed on 3 L of oxygen with improvement into the low 90s.  Reports being compliant with the remainder of his medications.  He notably denies any fever, chills, worsening productive  cough.    VAD Indication: Destination therapy- CKD   VAD interrogation & Equipment Management: Speed: 5400 Flow: 5.3 Power: 4.2 w    PI: 2.6   Alarms: few Events:rare  Fixed speed 5400 Low speed limit: 5100   Primary Controller:  Replace back up battery in 7 months Back up controller:  Replace back up battery in 4 months   Annual Equipment Maintenance on UBC/PM was performed on 03/27/2023.   I reviewed the LVAD parameters from today and compared the results to the patient's prior recorded data. LVAD interrogation was NEGATIVE for significant power changes, negative for clinical alarms and STABLE for PI events/speed drops. No programming changes were made and pump is functioning within specified parameters. Pt is performing daily controller and system monitor self tests along with completing weekly and monthly maintenance for LVAD equipment.    Past Medical History:  Diagnosis Date   AICD (automatic cardioverter/defibrillator) present    Anemia    Anxiety    CAD (coronary artery disease) 2009   AMI in 12/2007 with PCI to LAD, staged PCI to  mid/distal RCA, NSTEMI in 02/2009 with BMS to LCx   CHF (congestive heart failure) (HCC)    Chronic kidney disease 11/03/2016   stage 3 kidney disease   Dysrhythmia    "arrythmia problems at some point", "fatal rhythms"   History of blood transfusion    "I was bleeding from was rectum" (04/19/2017)   HLD (hyperlipidemia)  HTN (hypertension)    Hyperlipidemia 03/15/2011   Ischemic cardiomyopathy    Admitted in 07/2010 with CHF exacerbation   Myocardial infarction [facility])    "I've had 4" (04/19/2017)   Pneumonia 2018 X 1   Seizures (HCC)    one seizure in 04/2016 during cardiac event (04/19/2017)   STEMI 2009 (anterior), 2010 (lateral), and 2012 (inferior) 03/14/2011    Current Outpatient Medications  Medication Sig Dispense Refill   amiodarone (PACERONE) 200 MG tablet Take 1 tablet (200 mg total) by mouth daily. 90 tablet 3    amLODipine (NORVASC) 5 MG tablet Take 1 tablet (5 mg total) by mouth daily. 90 tablet 3   atorvastatin (LIPITOR) 40 MG tablet Take 1 tablet (40 mg total) by mouth daily. 90 tablet 3   fluticasone (FLONASE) 50 MCG/ACT nasal spray Place 2 sprays into both nostrils daily. (Patient taking differently: Place 2 sprays into both nostrils daily as needed for allergies or rhinitis.) 9.9 mL 3   furosemide (LASIX) 20 MG tablet Take 20 mg by mouth as needed.     magnesium oxide (MAG-OX) 400 (240 Mg) MG tablet Take 1 tablet (400 mg total) by mouth 2 (two) times daily. 180 tablet 3   Multiple Vitamin (MULTIVITAMIN WITH MINERALS) TABS tablet Take 1 tablet by mouth daily. 90 tablet 3   pantoprazole (PROTONIX) 40 MG tablet Take 1 tablet (40 mg total) by mouth daily. 90 tablet 3   tamsulosin (FLOMAX) 0.4 MG CAPS capsule Take 1 capsule (0.4 mg total) by mouth daily. 90 capsule 3   traZODone (DESYREL) 50 MG tablet Take 1 tablet (50 mg total) by mouth at bedtime. 90 tablet 3   warfarin (COUMADIN) 2 MG tablet TAKE 1 MG 03/21 THEN 2 MG (1 TABLET) EVERY MON, WED, AND FRIDAY, AND 1 MG (1/2 TABLET) ALL OTHER DAYS OR AS DIRECTED BY HF CLINIC     acetaminophen (TYLENOL) 325 MG tablet Take 2 tablets (650 mg total) by mouth every 4 (four) hours as needed for headache or mild pain. (Patient not taking: Reported on 08/03/2023)     diphenhydrAMINE (BENADRYL) 25 MG tablet Take 25-50 mg by mouth every 6 (six) hours as needed for allergies. (Patient not taking: Reported on 08/03/2023)     polyethylene glycol (MIRALAX / GLYCOLAX) 17 g packet Take 17 g by mouth daily. (Patient not taking: Reported on 08/03/2023) 30 each 0   senna (SENOKOT) 8.6 MG TABS tablet Take 2 tablets (17.2 mg total) by mouth at bedtime. (Patient not taking: Reported on 08/03/2023) 120 tablet 0   Current Facility-Administered Medications  Medication Dose Route Frequency Provider Last Rate Last Admin   cefTRIAXone (ROCEPHIN) 2 g in sodium chloride 0.9 % 100 mL IVPB  2 g  Intravenous Q24H Sabharwal, Aditya, DO 200 mL/hr at 08/03/23 1153 2 g at 08/03/23 1153   Facility-Administered Medications Ordered in Other Encounters  Medication Dose Route Frequency Provider Last Rate Last Admin   acetaminophen (TYLENOL) tablet 650 mg  650 mg Oral Q4H PRN Lee, [place], NP       Melene Muller ON 08/04/2023] amiodarone (PACERONE) tablet 200 mg  200 mg Oral Daily Lee, [place], NP       Melene Muller ON 08/04/2023] amLODipine (NORVASC) tablet 5 mg  5 mg Oral Daily Lee, [place], NP       Melene Muller ON 08/04/2023] atorvastatin (LIPITOR) tablet 40 mg  40 mg Oral Daily Lee, [place], NP       furosemide (LASIX) 120 mg in dextrose 5 % 50 mL  IVPB  120 mg Intravenous BID Lee, [place], NP       magnesium oxide (MAG-OX) tablet 400 mg  400 mg Oral BID Lee, [place], NP       ondansetron [facility]) injection 4 mg  4 mg Intravenous Q6H PRN Lee, [place], NP       Melene Muller ON 08/04/2023] pantoprazole (PROTONIX) EC tablet 40 mg  40 mg Oral Daily Lee, [place], NP       [START ON 08/04/2023] tamsulosin (FLOMAX) capsule 0.4 mg  0.4 mg Oral Daily Lee, [place], NP       traZODone (DESYREL) tablet 50 mg  50 mg Oral QHS Lee, [place], NP        Plavix [clopidogrel bisulfate]      Wt Readings from Last 3 Encounters:  08/03/23 74.7 kg (164 lb 9.6 oz)  07/14/23 74.7 kg (164 lb 10.9 oz)  07/10/23 78 kg (172 lb)   Vital Signs: Temp 98.7 Doppler Pressure: 90 Automatic BP: 90/74 (81) HR: 107 NSR  SPO2: 90% on 3 L   Weight: 164.6 lb w/ eqt Last weight: 172 lb w/ eqt    Physical Exam: General:  NAD.  Cor: LVAD hum. ICD site ok .  Tachycardic with JVP elevated to the jaw with positive HJR Lungs: Increased work of breathing, clear. Abdomen: soft, nontender, mildly distended. Driveline site clean. Anchor in place.  Extremities: no cyanosis, clubbing, rash. Warm no edema  Neuro: alert & oriented x 3. No focal deficits. Moves all 4 without problem    ASSESSMENT AND PLAN:  1. Acute on Chronic systolic CHF with HM III LVAD implant  04/20/17:  - Worsening hypoxia noted in clinic today, while weight is down he appears significantly volume up on exam. - Chest x-ray with worsening vascular congestion and significantly elevated JVP - Has not had an echocardiogram since implant but suspect he has some underlying RV dysfunction based on review of TEE at that time - Planned admission for IV diuresis, workup for potential pneumonia, and repeat echocardiogram - Recent influenza A admission, but does not appear acutely infected at this time.  WBC count is mildly elevated.  2. Ventricular tachycardia - Had ICD shock x 2 for VT on 03/23/23 (device reprogrammed at the time) - Had lead fracture. Device replaced 1/25 - No recent VT - Continue amio - Follows with EP  3. PAF/AFL - had DC-CV on 10/17/18 with subsequent short episodes of AF on amio 100 daily - Had recurrent AF in 10/22. Amio increased to 200 bid and converted.  - In NSR - Continue warfarin/amio  4. SAH - admit in 2/23 with [facility] requiring ventriculostomy - Has resolved.  - no recurrence.  - MAPs controlled today  5. h/o UGI AVM bleed - s/p APC x 4 lesions in 2/19 - No recurrent bleeding - Continue PPI - Off ASA due to GIB.   6. CKD Stage IIIa:  - Creatinine baseline 1.3 - 1.8 - Stable at 1.8 today - Recently elevated during prior hospital stay   7. VAD management - VAD interrogated personally. Parameters stable. - LDH 197 - INR goal 1.5-2.0 with h/o SAH Off ASA   - INR 2.8 today  Discussed warfarin dosing with PharmD personally. - Wonder if element of venous congestion - DLsite looks good  8. CAD:  s/p PCI RCA/PLOM with DES x2 and DES to mild LAD 1/18 - No s/s angina - Continue statin. Off ASA due to GIB  9. Hypomagnesemia. - Continue Mg supplementation  10.  HTN - MAPs with much improved control  11. [employer] fan - Enjoyed the recent [employer] loss, as did we all  I spent a total of 50 minutes today: 1) reviewing the patient's  medical records including previous charts, labs and recent notes from other providers; 2) examining the patient and counseling them on their medical issues/explaining the plan of care; 3) adjusting meds as needed and 4) reviewing recent CXR, arranging for inpatient admission, communication with treating provider   Romie Minus, MD  1:45 PM

## 2023-08-03 NOTE — H&P (Addendum)
 Advanced Heart Failure History and Physical Note   PCP:  Gordan Payment., MD  PCP-Cardiology: Arvilla Meres, MD    Reason for Admission: Acute hypoxic respiratory failure HPI:    David [name] is a 58 y.o. male with history of ICD (09 PCI to LAD, mRCA, '10 PCI to Lcx and '12 mRCA), HFrEF (EF ~20% for > 5 years), HLD and HTN. S/P LVAD HM-III 04/20/18.    S/P HM3 LVAD implant on 04/20/18, course c/b PNA shock liver and AKI requiring brief HD.   Readmitted 2/19 with hgb 3.8. INR 2.9. 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   Admitted in 2/23 with SAH in setting of HTN. Underwent ventriculostomy. Admission c/b by periods of confusion and urinary retention. Discharged to CIR   Was seen for acute visit on 12/24 for ICD firing x 2 on Thanksgiving. Seen by Dr. Graciela Husbands and ICD reprogrammed. Found to have lead fracture. Now s/p lead extraction and new ICD 1/25 c/b small chest hematoma.    He was admitted from VAD clinic earlier in the month for hypovolemia and AKI in the setting of Flu A.   Directly admitted from VAD clinic today after presenting with hypoxia (78% on RA) requiring 3L [place]. On exam, was diaphoretic and tachypneic. Vitals notable for doppler 90, HR 107, and 90% on 3L [place]. Labs showed K 3.7, BUN/Cr 16/1.85, LDH, 197, INR 2.8, WBC 11.5. CXR done, which showed pulmonary edema. He was given IV lasix and started on IV azithromycin and rochephin for suspected pneumonia. Driveline site C/D/I.    Wife at the bedside. Patient reports he is feeling better after the antibiotics. Volume significantly up on exam. HR has come down to 90s. Remains on [place]. Given 40 mg IV Lasix with only 300cc UOP.  LVAD Interrogation HM3: Speed: 5400  Flow: 5.3 PI: 2.6 Power: 4.2.   Home Medications Prior to Admission medications   Medication Sig Start Date End Date Taking? Authorizing Provider  acetaminophen  (TYLENOL) 325 MG tablet Take 2 tablets (650 mg total) by mouth every 4 (four) hours as needed for headache or mild pain. Patient not taking: Reported on 08/03/2023 06/28/21   Andrey Farmer, PA-C  amiodarone (PACERONE) 200 MG tablet Take 1 tablet (200 mg total) by mouth daily. 04/14/23   Bensimhon, Bevelyn Buckles, MD  amLODipine (NORVASC) 5 MG tablet Take 1 tablet (5 mg total) by mouth daily. 04/14/23   Bensimhon, Bevelyn Buckles, MD  atorvastatin (LIPITOR) 40 MG tablet Take 1 tablet (40 mg total) by mouth daily. 04/14/23   Bensimhon, Bevelyn Buckles, MD  diphenhydrAMINE (BENADRYL) 25 MG tablet Take 25-50 mg by mouth every 6 (six) hours as needed for allergies. Patient not taking: Reported on 08/03/2023    [provider]  fluticasone (FLONASE) 50 MCG/ACT nasal spray Place 2 sprays into both nostrils daily. Patient taking differently: Place 2 sprays into both nostrils daily as needed for allergies or rhinitis. 04/14/23   Bensimhon, Bevelyn Buckles, MD  furosemide (LASIX) 20 MG tablet Take 20 mg by mouth as needed.    [provider]  magnesium oxide (MAG-OX) 400 (240 Mg) MG tablet Take 1 tablet (400 mg total) by mouth 2 (two) times daily. 04/14/23   Bensimhon, Bevelyn Buckles, MD  Multiple Vitamin (MULTIVITAMIN WITH MINERALS) TABS tablet Take 1 tablet by mouth daily. 04/14/23   Bensimhon, Bevelyn Buckles, MD  pantoprazole (PROTONIX) 40  MG tablet Take 1 tablet (40 mg total) by mouth daily. 04/14/23   Bensimhon, Bevelyn Buckles, MD  polyethylene glycol (MIRALAX / GLYCOLAX) 17 g packet Take 17 g by mouth daily. Patient not taking: Reported on 08/03/2023 07/06/21   Love, Evlyn Kanner, PA-C  senna (SENOKOT) 8.6 MG TABS tablet Take 2 tablets (17.2 mg total) by mouth at bedtime. Patient not taking: Reported on 08/03/2023 07/06/21   Love, Evlyn Kanner, PA-C  tamsulosin (FLOMAX) 0.4 MG CAPS capsule Take 1 capsule (0.4 mg total) by mouth daily. 04/14/23   Bensimhon, Bevelyn Buckles, MD  traZODone (DESYREL) 50 MG tablet Take 1 tablet (50 mg total) by  mouth at bedtime. 04/14/23   Bensimhon, Bevelyn Buckles, MD  warfarin (COUMADIN) 2 MG tablet TAKE 1 MG 03/21 THEN 2 MG (1 TABLET) EVERY MON, WED, AND FRIDAY, AND 1 MG (1/2 TABLET) ALL OTHER DAYS OR AS DIRECTED BY HF CLINIC 07/14/23   Andrey Farmer, PA-C   Past Medical History: Past Medical History:  Diagnosis Date   AICD (automatic cardioverter/defibrillator) present    Anemia    Anxiety    CAD (coronary artery disease) 2009   AMI in 12/2007 with PCI to LAD, staged PCI to  mid/distal RCA, NSTEMI in 02/2009 with BMS to LCx   CHF (congestive heart failure) (HCC)    Chronic kidney disease 11/03/2016   stage 3 kidney disease   Dysrhythmia    "arrythmia problems at some point", "fatal rhythms"   History of blood transfusion    "I was bleeding from was rectum" (04/19/2017)   HLD (hyperlipidemia)    HTN (hypertension)    Hyperlipidemia 03/15/2011   Ischemic cardiomyopathy    Admitted in 07/2010 with CHF exacerbation   Myocardial infarction (HCC)    "I've had 4" (04/19/2017)   Pneumonia 2018 X 1   Seizures (HCC)    one seizure in 04/2016 during cardiac event (04/19/2017)   STEMI 2009 (anterior), 2010 (lateral), and 2012 (inferior) 03/14/2011   Past Surgical History: Past Surgical History:  Procedure Laterality Date   AORTIC VALVE REPAIR N/A 04/20/2017   Procedure: AORTIC VALVE REPAIR;  Surgeon: Alleen Borne, MD;  [facility]: [facility] OR;  Service: Open Heart Surgery;  Laterality: N/A;   BIV ICD INSERTION CRT-D N/A 08/01/2016   Procedure: BiV ICD Insertion CRT-D;  Surgeon: Will Jorja Loa, MD;  Location: [facility] INVASIVE CV LAB;  Service: Cardiovascular;  Laterality: N/A;   CARDIAC CATHETERIZATION N/A 05/05/2016   Procedure: Right/Left Heart Cath and Coronary Angiography;  Surgeon: Dolores Patty, MD;  Location[facility] INVASIVE CV LAB;  Service: Cardiovascular;  Laterality: N/A;   CARDIAC CATHETERIZATION N/A 05/09/2016   Procedure: Coronary Stent Intervention;  Surgeon: Peter M Swaziland, MD;   Location: [facility] INVASIVE CV LAB;  Service: Cardiovascular;  Laterality: N/A;   CARDIAC CATHETERIZATION N/A 05/09/2016   Procedure: Intravascular Pressure Wire/FFR Study;  Surgeon: Peter M Swazil[place], MD;  Location: [facility] INVASIVE CV LAB;  Service: Cardiovascular;  Laterality: N/A;   CARDIOVERSION N/A 10/17/2018   Procedure: CARDIOVERSION;  Surgeon: Dolores Patty, MD;  Location: [facility] ENDOSCOPY;  Service: Cardiovascular;  Laterality: N/A;   CORONARY ANGIOPLASTY WITH STENT PLACEMENT     "i've got a total of 12 stents" (04/19/2017)   ELECTROPHYSIOLOGY STUDY N/A 06/24/2016   Procedure: Electrophysiology Study;  Surgeon: Duke Salvia, MD;  Location: [facility] INVASIVE CV LAB;  Service: Cardiovascular;  Laterality: N/A;   ENTEROSCOPY N/A 06/03/2017   Procedure: ENTEROSCOPY;  Surgeon: Benancio Deeds, MD;  Location:  [facility] ENDOSCOPY;  Service: Gastroenterology;  Laterality: N/A;   EPICARDIAL PACING LEAD PLACEMENT Left 11/07/2016   Procedure: LV EPICARDIAL PACING LEAD PLACEMENT VIA LEFT MINI THORACOTOMY;  Surgeon: Alleen Borne, MD;  Location: [facility] OR;  Service: Thoracic;  Laterality: Left;   ICD IMPLANT N/A 06/24/2016   Procedure: POSSIBLE  ICD Implant;  Surgeon: Duke Salvia, MD;  Location: [facility] INVASIVE CV LAB;  Service: Cardiovascular;  Laterality: N/A;   INSERTION OF IMPLANTABLE LEFT VENTRICULAR ASSIST DEVICE N/A 04/20/2017   Procedure: INSERTION OF IMPLANTABLE LEFT VENTRICULAR ASSIST DEVICE, Aortic Valve repair;  Surgeon: Alleen Borne, MD;  Location: [facility] OR;  Service: Open Heart Surgery;  Laterality: N/A;  Heartmate 3   IR FLUORO GUIDE CV LINE RIGHT  05/08/2017   IR FLUORO GUIDE CV MIDLINE PICC RIGHT  03/14/2017   IR REMOVAL TUN CV CATH W/O FL  05/25/2017   IR US GUIDE VASC ACCESS RIGHT  03/14/2017   IR US GUIDE VASC ACCESS RIGHT  05/08/2017   LEAD EXTRACTION N/A 05/24/2023   Procedure: LEAD EXTRACTION;  Surgeon: Lanier Prude, MD;  [facility]: [facility] INVASIVE CV LAB;  Service: Cardiovascular;   Laterality: N/A;   LEFT HEART CATHETERIZATION WITH CORONARY ANGIOGRAM N/A 03/13/2011   Procedure: LEFT HEART CATHETERIZATION WITH CORONARY ANGIOGRAM;  Surgeon: Runell Gess, MD;  Location: [facility] CATH LAB;  Service: Cardiovascular;  Laterality: N/A;   MULTIPLE EXTRACTIONS WITH ALVEOLOPLASTY N/A 04/12/2017   Procedure: Extraction of tooth #'s 2, 5-12, 17, 20-29 with alveoloplasty amd maxillary right buccal exostoses reductions.;  Surgeon: Charlynne Pander, DDS;  Location: [facility] OR;  Service: Oral Surgery;  Laterality: N/A;   RIGHT HEART CATH N/A 04/19/2017   Procedure: RIGHT HEART CATH;  Surgeon: Dolores Patty, MD;  [facility]: [facility] INVASIVE CV LAB;  Service: Cardiovascular;  Laterality: N/A;   RIGHT/LEFT HEART CATH AND CORONARY ANGIOGRAPHY N/A 03/10/2017   Procedure: RIGHT/LEFT HEART CATH AND CORONARY ANGIOGRAPHY;  Surgeon: Dolores Patty, MD;  [facility] INVASIVE CV LAB;  Service: Cardiovascular;  Laterality: N/A;   TEE WITHOUT CARDIOVERSION N/A 04/20/2017   Procedure: TRANSESOPHAGEAL ECHOCARDIOGRAM (TEE);  Surgeon: Alleen Borne, MD;  Location: [facility] OR;  Service: Open Heart Surgery;  Laterality: N/A;   TEE WITHOUT CARDIOVERSION N/A 10/17/2018   Procedure: TRANSESOPHAGEAL ECHOCARDIOGRAM (TEE);  Surgeon: Dolores Patty, MD;  Location: [facility] ENDOSCOPY;  Service: Cardiovascular;  Laterality: N/A;   TRANSESOPHAGEAL ECHOCARDIOGRAM (CATH LAB) N/A 05/24/2023   Procedure: TRANSESOPHAGEAL ECHOCARDIOGRAM;  Surgeon: Lanier Prude, MD;  Location: [facility] INVASIVE CV LAB;  Service: Cardiovascular;  Laterality: N/A;   Family History:  Family History  Problem Relation Age of Onset   Coronary artery disease Father    Hypertension Father    Hypertension Sister    Coronary artery disease Brother    Emphysema Brother    Coronary artery disease Mother    Hypertension Mother    Emphysema Mother    Hypertension Daughter    Obesity Daughter    Diabetes Other    Hypertension Other    Coronary  artery disease Other    Social History: Social History   Socioeconomic History   Marital status: Married    Spouse name: Vikki Ports   Number of children: 3   Years of education: Not on file   Highest education level: Not on file  Occupational History   Not on file  Tobacco Use   Smoking status: Former    Current packs/day: 0.00    Average packs/day: 0.5 packs/day  for 43.0 years (21.5 ttl pk-yrs)    Types: Cigarettes, Cigars    Start date: 04/29/1973    Quit date: 04/29/2016    Years since quitting: 7.2   Smokeless tobacco: Never  Vaping Use   Vaping status: Former   Quit date: 04/29/2016  Substance and Sexual Activity   Alcohol use: No   Drug use: No   Sexual activity: Yes  Other Topics Concern   Not on file  Social History Narrative   Not on file   Social Drivers of Health   Financial Resource Strain: High Risk (07/04/2022)   Overall Financial Resource Strain (CARDIA)    Difficulty of Paying Living Expenses: Very hard  Food Insecurity: No Food Insecurity (07/10/2023)   Hunger Vital Sign    Worried About Running Out of Food in the Last Year: Never true    Ran Out of Food in the Last Year: Never true  Recent Concern: Food Insecurity - Food Insecurity Present (05/24/2023)   Hunger Vital Sign    Worried About Running Out of Food in the Last Year: Never true    Ran Out of Food in the Last Year: Sometimes true  Transportation Needs: No Transportation Needs (07/10/2023)   PRAPARE - Administrator, Civil Service (Medical): No    Lack of Transportation (Non-Medical): No  Physical Activity: Inactive (03/10/2017)   Exercise Vital Sign    Days of Exercise per Week: 0 days    Minutes of Exercise per Session: 0 min  Stress: No Stress Concern Present (03/10/2017)   [employer] of Occupational Health - Occupational Stress Questionnaire    Feeling of Stress : Not at all  Social Connections: Not on file   Allergies:  Allergies  Allergen Reactions   Plavix  [Clopidogrel Bisulfate] Hives   Objective:    There were no vitals filed for this visit.  Physical Exam     General: Pale appearing. No distress on [place] Cardiac: JVP ~12cm. Mechanical heart sounds with LVAD hum present.  Abdomen: Soft, non-tender, non-distended.  Driveline: Dressing C/D/I. No drainage or redness. Anchor in place. Extremities: Warm and dry. No peripheral edema. Neuro: Alert and oriented x3. Affect pleasant.   Telemetry   SR in 90s (personally reviewed)  EKG   N/A  Labs    Basic Metabolic Panel: Recent Labs  Lab 08/03/23 0904  NA 135  K 3.7  CL 102  CO2 22  GLUCOSE 114*  BUN 16  CREATININE 1.85*  CALCIUM 9.2   CBC: Recent Labs  Lab 08/03/23 0904  WBC 11.5*  HGB 13.1  HCT 41.6  MCV 78.8*  PLT 193   Coagulation Studies: Recent Labs    08/03/23 0904  LABPROT 30.0*  INR 2.8*   Imaging: DG Chest 2 View Result Date: 08/03/2023 CLINICAL DATA:  Shortness of breath EXAM: CHEST - 2 VIEW COMPARISON:  05/25/2023 and older FINDINGS: Sternal wires. LVAD. Left upper chest defibrillator. Enlarged cardiopericardial silhouette some vascular congestion. Mild component of edema. Persistent left retrocardiac opacity. No pneumothorax. Degenerative changes of the spine. IMPRESSION: Increasing vascular congestion.  Possible component of edema. Enlarged heart.  Postop chest.  Defibrillator.  LVAD. Electronically Signed   By: Karen Kays M.D.   On: 08/03/2023 10:39   Patient Profile   ALANO BLASCO is a 58 y.o. male with history of ICD (09 PCI to LAD, mRCA, '10 PCI to Lcx and '12 mRCA), HFrEF (EF ~20% for > 5 years), HLD and HTN. S/P LVAD HM-III  04/20/18.   Assessment/Plan   1. Acute Hypoxic Respiratory Failure: admitted earlier in the month with Flu A. ?PNA post viral infection. Also volume overloaded on exam. CXR with pulm congestion. On [place].  - started IV azithro/rochepin - diurese as below   2 . Chronic systolic CHF with HM III LVAD implant 04/20/17:  -  Volume up on exam. JVP to jaw. - Lasix 40 mg IV given with minimal UOP.  - Give Lasix 120 mg this afternoon, then bid dosing.    3. VAD management - VAD interrogated personally. Parameters stable. - INR 2.8. Goal 2-2.5. Warfarin dosing discussed with PharmD - LDH stable  4. CKD3: [place]leine 1.5-1.8 - sCr 1.85 on admit - diuresis as above   5. Hx of Ventricular tachycardia - Had ICD shock x 2 for VT on 11/24 (device reprogrammed at the time) - S/p fracture lead removal and ICD exchange 1/25 - Continue amio 200 mg daily   6. CAD:  s/p PCI RCA/PLOM with DES x2 and DES to mild LAD 1/18 - No s/s angina - Continue statin. Off ASA due to GIB   7. Urinary retention: reports intermittent dysuria in the past week - Continue Flomax 0.4 mg daily - Low threshold for bladder scan  8. HTN - continue norvasc 5 mg    [place] Lee, NP 08/03/2023, 11:25 AM  Advanced Heart Failure Team Pager [phone] (M-F; 7a - 5p)  Please contact [facility] for night-coverage after hours (4p -7a ) and weekends on [email or web address]  Patient seen with NP.  I formulated the plan and agree with the above note.   Patient was seen in LVAD clinic with congestion and shortness of breath.  Admitted about a month ago with influenza A. He was hypoxemic with CXR showing vascular congestion.  Creatinine 1.85, WBCs 11.5, hgb 13.1, INR 1.8, LDH 197.   LVAD parameters reviewed and stable.   General: Well appearing this am. NAD.  HEENT: Normal. Neck: Supple, JVP 14 cm. Carotids OK.  Cardiac:  Mechanical heart sounds with LVAD hum present.  Lungs:  Crackles at bases.   Abdomen:  NT, ND, no HSM. No bruits or masses. +BS  LVAD exit site: Well-healed and incorporated. Dressing dry and intact. No erythema or drainage. Stabilization device present and accurately applied. Driveline dressing changed daily per sterile technique. Extremities:  Warm and dry. No cyanosis, clubbing, rash, or edema.  Neuro:  Alert & oriented x 3. Cranial  nerves grossly intact. Moves all 4 extremities w/o difficulty. Affect pleasant    Patient with HM3 LVAD.  Patient is volume overloaded on exam.  Will need diuresis.  - Lasix 80 mg IV bid.  - He will need an echo to assess RV - Continue warfarin for goal INR 2-2.5.   Recent influenza A, ?secondary bacterial PNA.  WBCs mildly elevated.  - Send procalcitonin.  - Cover with ceftriaxone azithromycin.   Marca Ancona 08/03/2023 2:41 PM

## 2023-08-04 ENCOUNTER — Inpatient Hospital Stay (HOSPITAL_COMMUNITY)

## 2023-08-04 DIAGNOSIS — N183 Chronic kidney disease, stage 3 unspecified: Secondary | ICD-10-CM | POA: Diagnosis not present

## 2023-08-04 DIAGNOSIS — I5022 Chronic systolic (congestive) heart failure: Secondary | ICD-10-CM | POA: Diagnosis not present

## 2023-08-04 DIAGNOSIS — I5043 Acute on chronic combined systolic (congestive) and diastolic (congestive) heart failure: Secondary | ICD-10-CM

## 2023-08-04 DIAGNOSIS — J9601 Acute respiratory failure with hypoxia: Secondary | ICD-10-CM | POA: Diagnosis not present

## 2023-08-04 LAB — ECHOCARDIOGRAM COMPLETE
Area-P 1/2: 5.09 cm2
Est EF: <20
Height: 65 in
S' Lateral: 5.9 cm
Weight: 2462.1 [oz_av]

## 2023-08-04 LAB — BASIC METABOLIC PANEL WITH GFR
Anion gap: 11 (ref 5–15)
BUN: 12 mg/dL (ref 6–20)
CO2: 24 mmol/L (ref 22–32)
Calcium: 8.3 mg/dL — ABNORMAL LOW (ref 8.9–10.3)
Chloride: 102 mmol/L (ref 98–111)
Creatinine, Ser: 1.66 mg/dL — ABNORMAL HIGH (ref 0.61–1.24)
GFR, Estimated: 47 mL/min — ABNORMAL LOW (ref >60)
Glucose, Bld: 99 mg/dL (ref 70–99)
Potassium: 3.5 mmol/L (ref 3.5–5.1)
Sodium: 137 mmol/L (ref 135–145)

## 2023-08-04 LAB — CBC
HCT: 34.9 % — ABNORMAL LOW (ref 39.0–52.0)
Hemoglobin: 11 g/dL — ABNORMAL LOW (ref 13.0–17.0)
MCH: 24.8 pg — ABNORMAL LOW (ref 26.0–34.0)
MCHC: 31.5 g/dL (ref 30.0–36.0)
MCV: 78.8 fL — ABNORMAL LOW (ref 80.0–100.0)
Platelets: 165 10*3/uL (ref 150–400)
RBC: 4.43 MIL/uL (ref 4.22–5.81)
RDW: 16.4 % — ABNORMAL HIGH (ref 11.5–15.5)
WBC: 8.5 10*3/uL (ref 4.0–10.5)
nRBC: 0 % (ref 0.0–0.2)

## 2023-08-04 LAB — PROTIME-INR
INR: 3.5 — ABNORMAL HIGH (ref 0.8–1.2)
Prothrombin Time: 35.6 s — ABNORMAL HIGH (ref 11.4–15.2)

## 2023-08-04 LAB — LACTATE DEHYDROGENASE: LDH: 169 U/L (ref 98–192)

## 2023-08-04 LAB — MAGNESIUM: Magnesium: 1.8 mg/dL (ref 1.7–2.4)

## 2023-08-04 MED ORDER — POTASSIUM CHLORIDE CRYS ER 20 MEQ PO TBCR
40.0000 meq | EXTENDED_RELEASE_TABLET | Freq: Two times a day (BID) | ORAL | Status: DC
  Administered 2023-08-04 – 2023-08-05 (×3): 40 meq via ORAL
  Filled 2023-08-04 (×3): qty 2

## 2023-08-04 MED ORDER — MAGNESIUM SULFATE 2 GM/50ML IV SOLN
2.0000 g | Freq: Once | INTRAVENOUS | Status: AC
  Administered 2023-08-04: 2 g via INTRAVENOUS
  Filled 2023-08-04: qty 50

## 2023-08-04 NOTE — TOC Initial Note (Signed)
 Transition of Care [facility]) - Initial/Assessment Note    Patient Details  Name: David [name] MRN: [id number] Date of Birth: 06/28/1965  [facility]) CM/SW Contact:    Elliot Cousin, RN Phone Number: [phone] 08/04/2023, 4:12 PM  Clinical Narrative:                 TOC CM spoke to pt and wife at bedside. Pt is independent pta. Wife will provide transportation home.   Expected Discharge Plan: Home/Self Care Barriers to Discharge: Continued Medical Work up   Patient Goals and CMS Choice            Expected Discharge Plan and Services   Discharge Planning Services: CM Consult   Living arrangements for the past 2 months: Apartment                                      Prior Living Arrangements/Services Living arrangements for the past 2 months: Apartment Lives with:: Spouse Patient language and need for interpreter reviewed:: Yes Do you feel safe going back to the place where you live?: Yes      Need for Family Participation in Patient Care: No (Comment) Care giver support system in place?: No (comment) Current home services: DME (rolling walker) Criminal Activity/Legal Involvement Pertinent to Current Situation/Hospitalization: No - Comment as needed  Activities of Daily Living   ADL Screening (condition at time of admission) Independently performs ADLs?: Yes (appropriate for developmental age) Is the patient deaf or have difficulty hearing?: No Does the patient have difficulty seeing, even when wearing glasses/contacts?: No Does the patient have difficulty concentrating, remembering, or making decisions?: No  Permission Sought/Granted Permission sought to share information with : Case Manager Permission granted to share information with : Yes, Verbal Permission Granted  Share Information with NAME: Jhan Conery     Permission granted to share info w Relationship: wife  Permission granted to share info w Contact Information:  [phone]  Emotional Assessment Appearance:: Appears stated age Attitude/Demeanor/Rapport: Engaged Affect (typically observed): Accepting Orientation: : Oriented to Self, Oriented to Place, Oriented to  Time, Oriented to Situation   Psych Involvement: No (comment)  Admission diagnosis:  Pneumonia [J18.9] Patient Active Problem List   Diagnosis Date Noted   Pneumonia 08/03/2023   SOB (shortness of breath) 07/10/2023   Malfunction of implantable defibrillator ventricular (ICD) lead 05/24/2023   ICD (implantable cardioverter-defibrillator) lead failure 05/24/2023   Failure of implantable cardioverter-defibrillator (ICD) lead 05/24/2023   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 08/30/2022   SAH (subarachnoid hemorrhage) (HCC)    LVAD (left ventricular assist device) present (HCC)    ICH (intracerebral hemorrhage) (HCC) 06/05/2021   Left ventricular assist device present (HCC) 06/08/2017   Gastric AVM    Presence of left ventricular assist device (LVAD) (HCC) 04/20/2017   Palliative care encounter    Ischemic cardiomyopathy 11/07/2016   CKD (chronic kidney disease), stage III (HCC) 05/13/2016   PAF (paroxysmal atrial fibrillation) (HCC)    Chronic systolic heart failure (HCC) 03/15/2011   PCP:  Gordan Payment., MD Pharmacy:   [facility] [address], [place] - [address] [place] [address] Phone: [phone] Fax: [phone]  [facility] [address] [place] [address] Phone: [phone] Fax: [phone]     Social Drivers of Health (SDOH) Social History: SDOH  Screenings   Food Insecurity: No Food Insecurity (08/03/2023)  Recent Concern: Food Insecurity - Food Insecurity Present (05/24/2023)  Housing: Low Risk  (08/03/2023)  Transportation Needs: No Transportation Needs (08/03/2023)  Utilities: Not At Risk (08/03/2023)  Recent Concern: Utilities - At Risk (05/24/2023)   Depression (PHQ2-9): High Risk (07/19/2021)  Financial Resource Strain: High Risk (07/04/2022)  Physical Activity: Inactive (03/10/2017)  Stress: No Stress Concern Present (03/10/2017)  Tobacco Use: Medium Risk (08/03/2023)   SDOH Interventions:     Readmission Risk Interventions     No data to display

## 2023-08-04 NOTE — Progress Notes (Signed)
 PHARMACY - ANTICOAGULATION CONSULT NOTE  Pharmacy Consult for warfarin Indication:  LVAD  Allergies  Allergen Reactions   Plavix [Clopidogrel Bisulfate] Hives    Patient Measurements: Height: 5\' 5"  (165.1 cm) Weight: 69.8 kg (153 lb 14.1 oz) IBW/kg (Calculated) : 61.5 HEPARIN DW (KG): 74.4  Vital Signs: Temp: 98.1 F (36.7 C) (04/11 1200) Temp Source: Oral (04/11 1200) BP: 100/86 (04/11 1200) Pulse Rate: 94 (04/11 1000)  Labs: Recent Labs    08/03/23 0904 08/04/23 0220  HGB 13.1 11.0*  HCT 41.6 34.9*  PLT 193 165  LABPROT 30.0* 35.6*  INR 2.8* 3.5*  CREATININE 1.85* 1.66*    Estimated Creatinine Clearance: 41.7 mL/min (A) (by C-G formula based on SCr of 1.66 mg/dL (H)).   Medical History: Past Medical History:  Diagnosis Date   AICD (automatic cardioverter/defibrillator) present    Anemia    Anxiety    CAD (coronary artery disease) 2009   AMI in 12/2007 with PCI to LAD, staged PCI to  mid/distal RCA, NSTEMI in 02/2009 with BMS to LCx   CHF (congestive heart failure) (HCC)    Chronic kidney disease 11/03/2016   stage 3 kidney disease   Dysrhythmia    "arrythmia problems at some point", "fatal rhythms"   History of blood transfusion    "I was bleeding from was rectum" (04/19/2017)   HLD (hyperlipidemia)    HTN (hypertension)    Hyperlipidemia 03/15/2011   Ischemic cardiomyopathy    Admitted in 07/2010 with CHF exacerbation   Myocardial infarction (HCC)    "I've had 4" (04/19/2017)   Pneumonia 2018 X 1   Seizures (HCC)    one seizure in 04/2016 during cardiac event (04/19/2017)   STEMI 2009 (anterior), 2010 (lateral), and 2012 (inferior) 03/14/2011    Medications:  Scheduled:   amiodarone  200 mg Oral Daily   amLODipine  5 mg Oral Daily   atorvastatin  40 mg Oral Daily   magnesium oxide  400 mg Oral BID   pantoprazole  40 mg Oral Daily   potassium chloride  40 mEq Oral BID   tamsulosin  0.4 mg Oral Daily   traZODone  50 mg Oral QHS   Warfarin -  Pharmacist Dosing Inpatient   Does not apply q1600    Assessment: 57 yom presenting with hypoxia. Hx of LVAD HM3 in 2019 - on warfarin PTA. Last dose on 4/8 - PTA regimen is 1 mg daily except 2 mg MWF.   INR is supratherapeutic at 3.5. Hgb 11, plt 193>165. LDH 197. No s/sx of bleeding.   Goal of Therapy:  INR 1.5-2 Monitor platelets by anticoagulation protocol: Yes   Plan:  Continue to hold warfarin until INR closer to goal Monitor daily INR, CBC, and for s/sx of bleeding   Thank you for allowing pharmacy to participate in this patient's care,  Jenetta Downer, [facility] Clinical Pharmacist  08/04/2023 1:18 PM   [facility] pharmacy phone numbers are listed on [email or web address]

## 2023-08-04 NOTE — Plan of Care (Signed)
  Problem: Education: Goal: Patient will understand all VAD equipment and how it functions Outcome: Progressing Goal: Patient will be able to verbalize current INR target range and antiplatelet therapy for discharge home Outcome: Progressing   Problem: Cardiac: Goal: LVAD will function as expected and patient will experience no clinical alarms Outcome: Progressing   Problem: Education: Goal: Knowledge of General Education information will improve Description: Including pain rating scale, medication(s)/side effects and non-pharmacologic comfort measures Outcome: Progressing   Problem: Health Behavior/Discharge Planning: Goal: Ability to manage health-related needs will improve Outcome: Progressing   Problem: Clinical Measurements: Goal: Ability to maintain clinical measurements within normal limits will improve Outcome: Progressing Goal: Will remain free from infection Outcome: Progressing Goal: Diagnostic test results will improve Outcome: Progressing Goal: Respiratory complications will improve Outcome: Progressing Goal: Cardiovascular complication will be avoided Outcome: Progressing   Problem: Activity: Goal: Risk for activity intolerance will decrease Outcome: Progressing   Problem: Nutrition: Goal: Adequate nutrition will be maintained Outcome: Progressing   Problem: Coping: Goal: Level of anxiety will decrease Outcome: Progressing   Problem: Elimination: Goal: Will not experience complications related to bowel motility Outcome: Progressing Goal: Will not experience complications related to urinary retention Outcome: Progressing   Problem: Pain Managment: Goal: General experience of comfort will improve and/or be controlled Outcome: Progressing   Problem: Safety: Goal: Ability to remain free from injury will improve Outcome: Progressing   Problem: Skin Integrity: Goal: Risk for impaired skin integrity will decrease Outcome: Progressing

## 2023-08-04 NOTE — Plan of Care (Signed)
  Problem: Education: Goal: Patient will understand all VAD equipment and how it functions Outcome: Progressing   Problem: Cardiac: Goal: LVAD will function as expected and patient will experience no clinical alarms Outcome: Progressing   Problem: Education: Goal: Knowledge of General Education information will improve Description: Including pain rating scale, medication(s)/side effects and non-pharmacologic comfort measures Outcome: Progressing   Problem: Clinical Measurements: Goal: Ability to maintain clinical measurements within normal limits will improve Outcome: Progressing Goal: Will remain free from infection Outcome: Progressing Goal: Diagnostic test results will improve Outcome: Progressing

## 2023-08-04 NOTE — Progress Notes (Signed)
 LVAD Coordinator Rounding Note:  Admitted 08/04/23 following a hospital follow up in VAD Clinic. Pt reported increased bloating and SOB since Tuesday. SPO2 79% on RA. Pt placed on 3L [place] in VAD Clinic and admitted to [facility].   HM3 LVAD implanted on 04/20/17 by Dr Laneta Simmers under destination therapy criteria.  Pt lying in bed on my arrival. States he feels better this morning and was able to eat some food. He endorses good urine output from Lasix. Motivated to ambulate. PT/OT evaluation place by AHF team this morning to assess for home O2 needs.   Vital signs: Temp: 97.8 HR:84 Doppler Pressure:84 Automatic BP: 94/79 (85) O2 Sat: 94% on 3L [place] Wt:  169.7>165>153.9 lbs    LVAD interrogation reveals:  Speed: 5400 Flow: 5.1 Power: 4.1 w PI: 3.3   Alarms: none Events:  rare Hematocrit: 43  Fixed speed: 5400 Low speed limit: 5100  Drive Line: Existing dressing clean, dry, and intact. Anchor correctly applied. Weekly dressing maintained by pt's wife at home. Weekly dressing changes using weekly kit by wife Vikki Ports. Next dressing change due: 08/06/23.  Labs:  LDH trend:169  INR trend:3.5  Cr trend:1.66  WBC trend:8.5  Anticoagulation Plan: -INR Goal: 1.5-2.0 -ASA Dose: none  Device: -Medtronic BiV -Therapies: ON  Arrythmias: Hx Afib/AF  Plan/Recommendations:  1. Page VAD coordinator with any drive line or equipment concerns 2. Weekly drive line dressing changes by bedside RN  [name] RN,BSN VAD Coordinator  Office: [phone]  24/7 Pager: [phone]

## 2023-08-04 NOTE — Progress Notes (Addendum)
 Advanced Heart Failure VAD Team Note  PCP-Cardiologist: Arvilla Meres, MD  Chief Complaint: Heart Failure/PNA Subjective:   4/10:  Possible PNA. Started on Ceftriaxone/azithromycin. Diuresed with IV lasix.   Negative 1.5 liters.    Feeling better this morning. Denies SOB.     LVAD INTERROGATION:  HeartMate III LVAD:   Flow 5 liters/min, speed 5400, power 4 PI 3.5.  Rare PI events  Objective:    Vital Signs:   Temp:  [97.6 F (36.4 C)-98.6 F (37 C)] 97.7 F (36.5 C) (04/11 0618) Pulse Rate:  [84-91] 88 (04/11 0618) Resp:  [18-21] 20 (04/11 0618) BP: (89-103)/(56-85) 98/84 (04/11 0618) SpO2:  [91 %-100 %] 96 % (04/11 0618) Weight:  [69.8 kg-74.4 kg] 69.8 kg (04/11 0618) Last BM Date : 08/03/23 Mean arterial Pressure 90   Intake/Output:   Intake/Output Summary (Last 24 hours) at 08/04/2023 0721 Last data filed at 08/04/2023 1610 Gross per 24 hour  Intake 55.73 ml  Output 1575 ml  Net -1519.27 ml     Physical Exam    General:   No resp difficulty Neck: supple. JVP 8-9 . ts.  Cor: Mechanical heart sounds with LVAD hum present. Lungs: clear on 4 liters.  Abdomen: soft, nontender, nondistended.  Driveline: C/D/I; securement device intact and driveline incorporated Extremities: no cyanosis, clubbing, rash, edema Neuro: alert & orientedx3,  moves all 4 extremities w/o difficulty. Affect pleasant   Telemetry  SR 80s    EKG    N/A  Labs   Basic Metabolic Panel: Recent Labs  Lab 08/03/23 0904 08/04/23 0220  NA 135 137  K 3.7 3.5  CL 102 102  CO2 22 24  GLUCOSE 114* 99  BUN 16 12  CREATININE 1.85* 1.66*  CALCIUM 9.2 8.3*    Liver Function Tests: Recent Labs  Lab 08/03/23 0904  AST 22  ALT 16  ALKPHOS 102  BILITOT 1.6*  PROT 8.3*  ALBUMIN 2.8*   No results for input(s): "LIPASE", "AMYLASE" in the last 168 hours. No results for input(s): "AMMONIA" in the last 168 hours.  CBC: Recent Labs  Lab 08/03/23 0904 08/04/23 0220  WBC  11.5* 8.5  HGB 13.1 11.0*  HCT 41.6 34.9*  MCV 78.8* 78.8*  PLT 193 165    INR: Recent Labs  Lab 07/31/23 0000 08/03/23 0904 08/04/23 0220  INR 2.7 2.8* 3.5*    Other results: EKG:    Imaging   DG Chest 2 View Result Date: 08/03/2023 CLINICAL DATA:  Shortness of breath EXAM: CHEST - 2 VIEW COMPARISON:  05/25/2023 and older FINDINGS: Sternal wires. LVAD. Left upper chest defibrillator. Enlarged cardiopericardial silhouette some vascular congestion. Mild component of edema. Persistent left retrocardiac opacity. No pneumothorax. Degenerative changes of the spine. IMPRESSION: Increasing vascular congestion.  Possible component of edema. Enlarged heart.  Postop chest.  Defibrillator.  LVAD. Electronically Signed   By: Karen Kays M.D.   On: 08/03/2023 10:39     Medications:     Scheduled Medications:  amiodarone  200 mg Oral Daily   amLODipine  5 mg Oral Daily   atorvastatin  40 mg Oral Daily   magnesium oxide  400 mg Oral BID   pantoprazole  40 mg Oral Daily   tamsulosin  0.4 mg Oral Daily   traZODone  50 mg Oral QHS   Warfarin - Pharmacist Dosing Inpatient   Does not apply q1600    Infusions:  azithromycin     cefTRIAXone (ROCEPHIN)  IV     furosemide  Stopped (08/03/23 1645)    PRN Medications: acetaminophen, ondansetron (ZOFRAN) IV   Patient Profile  David [name] is a 58 y.o. male with history of ICD (09 PCI to LAD, mRCA, '10 PCI to Lcx and '12 mRCA), HFrEF (EF ~20% for > 5 years), HLD and HTN. S/P LVAD HM-III 04/20/18.   Admitted with acute hypoxic respiratory failure, possible PNA.    Assessment/Plan:   1. Acute Hypoxic Respiratory Failure: admitted earlier in the month with Flu A. ?PNA post viral infection. Also volume overloaded on exam. CXR with pulm congestion. On [place].  - Continue IV azithro/rochepin.  -WBC trending down. Afebrile.    2 . Chronic systolic CHF with HM III LVAD implant 04/20/17 - Volume status improving. Needs additional diuresis.  Continue IV lasix today.  -GDMT limited by CKD.    3. VAD management - VAD interrogated personally. Parameters stable. - INR supra therapeutic, 3.5 Goal 2-2.5. Warfarin dosing discussed with PharmD - LDH stable   4. CKD3: basleine 1.5-1.8 - sCr 1.85 on admit, stable 1.6 today.  - Daily BMET    5. Hx of Ventricular tachycardia - Had ICD shock x 2 for VT on 11/24 (device reprogrammed at the time) - S/p fracture lead removal and ICD exchange 1/25 - Continue amio 200 mg daily   6. CAD:  s/p PCI RCA/PLOM with DES x2 and DES to mild LAD 1/18 - No chest pain.  - Continue statin. Off ASA due to GIB   7. Urinary retention: reports intermittent dysuria in the past week - Continue Flomax 0.4 mg daily - Low threshold for bladder scan   8. HTN - Stable. - continue norvasc 5 mg   Consult PT.   I reviewed the LVAD parameters from today, and compared the results to the patient's prior recorded data.  No programming changes were made.  The LVAD is functioning within specified parameters.  The patient performs LVAD self-test daily.  LVAD interrogation was negative for any significant power changes, alarms or PI events/speed drops.  LVAD equipment check completed and is in good working order.  Back-up equipment present.   LVAD education done on emergency procedures and precautions and reviewed exit site care.  Length of Stay: 1  Amy Clegg, NP 08/04/2023, 7:21 AM  VAD Team --- VAD ISSUES ONLY--- Pager [phone] (7am - 7am)  Advanced Heart Failure Team  Pager [phone] (M-F; 7a - 5p)  Please contact [facility] for night-coverage after hours (5p -7a ) and weekends on [email or web address]  Patient seen with NP, I formulated the plan and agree with the above note.   Good diuresis yesterday, weight down.  Oxygen saturation 94% on 2L [place].   He remains on ceftriaxone/azithromycin for possible PNA.   Echo was done today and I reviewed, interventricular septum mildly shifted right, mild RVE with mild RV  dysfunction.   LVAD parameters stable with 1 PI event.   General: Well appearing this am. NAD.  HEENT: Normal. Neck: Supple, JVP 12-14 cm. Carotids OK.  Cardiac:  Mechanical heart sounds with LVAD hum present.  Lungs:  Occasional rhonchi Abdomen:  NT, ND, no HSM. No bruits or masses. +BS  LVAD exit site: Well-healed and incorporated. Dressing dry and intact. No erythema or drainage. Stabilization device present and accurately applied. Driveline dressing changed daily per sterile technique. Extremities:  Warm and dry. No cyanosis, clubbing, rash, or edema.  Neuro:  Alert & oriented x 3. Cranial nerves grossly intact. Moves all 4 extremities w/o difficulty. Affect pleasant  Patient with HM3 LVAD.  Presented with volume overload, diuresed well yesterday.  Still volume overloaded, creatinine trending down 1.85 => 1.66.  Echo reviewed as above.  - Continue Lasix 120 mg IV bid, may be ready for po tomorrow.  - LVAD speed increased 5400 => 5500 after echo today.  - On warfarin for goal INR 2-2.5, no dose today with INR 3.5.     Recent influenza A, ?secondary bacterial PNA.  WBCs mildly elevated at admission with diffuse rhonchi on lung exam.  Lung exam better with diuresis, WBCs now normal 8.5.   - Covering with ceftriaxone/azithromycin, would complete course of abx for PNA though suspect main problem is volume overload.   Marca Ancona 08/04/2023 12:11 PM

## 2023-08-04 NOTE — Progress Notes (Signed)
  Echocardiogram 2D Echocardiogram has been performed.  Rosemary Holms, RDCS 08/04/2023, 10:26 AM

## 2023-08-05 DIAGNOSIS — Z95811 Presence of heart assist device: Secondary | ICD-10-CM

## 2023-08-05 DIAGNOSIS — J9601 Acute respiratory failure with hypoxia: Secondary | ICD-10-CM | POA: Diagnosis not present

## 2023-08-05 LAB — CBC
HCT: 34.9 % — ABNORMAL LOW (ref 39.0–52.0)
Hemoglobin: 11.2 g/dL — ABNORMAL LOW (ref 13.0–17.0)
MCH: 25.1 pg — ABNORMAL LOW (ref 26.0–34.0)
MCHC: 32.1 g/dL (ref 30.0–36.0)
MCV: 78.1 fL — ABNORMAL LOW (ref 80.0–100.0)
Platelets: 197 10*3/uL (ref 150–400)
RBC: 4.47 MIL/uL (ref 4.22–5.81)
RDW: 16.3 % — ABNORMAL HIGH (ref 11.5–15.5)
WBC: 9 10*3/uL (ref 4.0–10.5)
nRBC: 0 % (ref 0.0–0.2)

## 2023-08-05 LAB — BASIC METABOLIC PANEL WITH GFR
Anion gap: 10 (ref 5–15)
BUN: 16 mg/dL (ref 6–20)
CO2: 25 mmol/L (ref 22–32)
Calcium: 8.4 mg/dL — ABNORMAL LOW (ref 8.9–10.3)
Chloride: 100 mmol/L (ref 98–111)
Creatinine, Ser: 1.68 mg/dL — ABNORMAL HIGH (ref 0.61–1.24)
GFR, Estimated: 47 mL/min — ABNORMAL LOW (ref >60)
Glucose, Bld: 109 mg/dL — ABNORMAL HIGH (ref 70–99)
Potassium: 3.8 mmol/L (ref 3.5–5.1)
Sodium: 135 mmol/L (ref 135–145)

## 2023-08-05 LAB — MAGNESIUM: Magnesium: 2 mg/dL (ref 1.7–2.4)

## 2023-08-05 LAB — LACTATE DEHYDROGENASE: LDH: 179 U/L (ref 98–192)

## 2023-08-05 LAB — PROTIME-INR
INR: 3.9 — ABNORMAL HIGH (ref 0.8–1.2)
Prothrombin Time: 38.3 s — ABNORMAL HIGH (ref 11.4–15.2)

## 2023-08-05 MED ORDER — POTASSIUM CHLORIDE CRYS ER 20 MEQ PO TBCR
40.0000 meq | EXTENDED_RELEASE_TABLET | Freq: Every day | ORAL | Status: DC
  Administered 2023-08-06 – 2023-08-11 (×6): 40 meq via ORAL
  Filled 2023-08-05 (×6): qty 2

## 2023-08-05 NOTE — Progress Notes (Signed)
 PHARMACY - ANTICOAGULATION CONSULT NOTE  Pharmacy Consult for warfarin Indication:  LVAD  Allergies  Allergen Reactions   Plavix [Clopidogrel Bisulfate] Hives    Patient Measurements: Height: 5\' 5"  (165.1 cm) Weight: 70.3 kg (154 lb 15.7 oz) IBW/kg (Calculated) : 61.5 HEPARIN DW (KG): 74.4  Vital Signs: Temp: 97.8 F (36.6 C) (04/12 1101) Temp Source: Oral (04/12 1101) BP: 96/59 (04/12 1101) Pulse Rate: 87 (04/12 1101)  Labs: Recent Labs    08/03/23 0904 08/04/23 0220 08/05/23 0203  HGB 13.1 11.0* 11.2*  HCT 41.6 34.9* 34.9*  PLT 193 165 197  LABPROT 30.0* 35.6* 38.3*  INR 2.8* 3.5* 3.9*  CREATININE 1.85* 1.66* 1.68*    Estimated Creatinine Clearance: 41.2 mL/min (A) (by C-G formula based on SCr of 1.68 mg/dL (H)).   Medical History: Past Medical History:  Diagnosis Date   AICD (automatic cardioverter/defibrillator) present    Anemia    Anxiety    CAD (coronary artery disease) 2009   AMI in 12/2007 with PCI to LAD, staged PCI to  mid/distal RCA, NSTEMI in 02/2009 with BMS to LCx   CHF (congestive heart failure) (HCC)    Chronic kidney disease 11/03/2016   stage 3 kidney disease   Dysrhythmia    "arrythmia problems at some point", "fatal rhythms"   History of blood transfusion    "I was bleeding from was rectum" (04/19/2017)   HLD (hyperlipidemia)    HTN (hypertension)    Hyperlipidemia 03/15/2011   Ischemic cardiomyopathy    Admitted in 07/2010 with CHF exacerbation   Myocardial infarction (HCC)    "I've had 4" (04/19/2017)   Pneumonia 2018 X 1   Seizures (HCC)    one seizure in 04/2016 during cardiac event (04/19/2017)   STEMI 2009 (anterior), 2010 (lateral), and 2012 (inferior) 03/14/2011    Medications:  Scheduled:   amiodarone  200 mg Oral Daily   amLODipine  5 mg Oral Daily   atorvastatin  40 mg Oral Daily   magnesium oxide  400 mg Oral BID   pantoprazole  40 mg Oral Daily   [START ON 08/06/2023] potassium chloride  40 mEq Oral Daily    tamsulosin  0.4 mg Oral Daily   traZODone  50 mg Oral QHS   Warfarin - Pharmacist Dosing Inpatient   Does not apply q1600    Assessment: 57 yom presenting with hypoxia. Hx of LVAD HM3 in 2019 - on warfarin PTA. Last dose on 4/8 - PTA regimen is 1 mg daily except 2 mg MWF.   INR remains supratherapeutic at 3.9 today despite holding warfarin.  Goal of Therapy:  INR 1.5-2 Monitor platelets by anticoagulation protocol: Yes   Plan:  Continue to hold warfarin until INR closer to goal Monitor daily INR, CBC, and for s/sx of bleeding    Levin Reamer, PharmD, BCPS, [facility] Clinical Pharmacist [phone] Please check AMION for all [facility] Pharmacy numbers 08/05/2023

## 2023-08-05 NOTE — Evaluation (Signed)
 Physical Therapy Evaluation and Discharge Patient Details Name: David [name] MRN: [id number] DOB: [birth year] Today's Date: 08/05/2023  History of Present Illness  58 y.o. male presented 4/10 with acute hypoxic respiratory failure. CXR done, which showed pulmonary edema. He was given IV lasix and started on IV azithromycin and rochephin for suspected pneumonia. PMH significant of ICD, HFrEF (EF ~20% for > 5 years), HLD and HTN. S/P LVAD HM-III 04/20/18.  Clinical Impression   Patient evaluated by Physical Therapy with no further acute PT needs identified. Patient is independent with his bed mobility, transfers and gait with no device. He did desaturate on room air and required 2L oxygen with sats 90% while walking. (See separate ambulatory saturation note).  PT is signing off. Thank you for this referral.         If plan is discharge home, recommend the following:     Can travel by private vehicle        Equipment Recommendations None recommended by PT  Recommendations for Other Services       Functional Status Assessment Patient has not had a recent decline in their functional status     Precautions / Restrictions Precautions Precautions: Other (comment) Precaution/Restrictions Comments: LVAD equipment Restrictions Weight Bearing Restrictions Per Provider Order: No      Mobility  Bed Mobility Overal bed mobility: Independent                  Transfers Overall transfer level: Independent Equipment used: None                    Ambulation/Gait Ambulation/Gait assistance: Independent Gait Distance (Feet): 240 Feet Assistive device: None Gait Pattern/deviations: WFL(Within Functional Limits)   Gait velocity interpretation: >2.62 ft/sec, indicative of community ambulatory   General Gait Details: required 2L O2 (see ambulatory O2 saturation note)  Stairs            Wheelchair Mobility     Tilt Bed    Modified Rankin (Stroke Patients  Only)       Balance Overall balance assessment: Independent                                           Pertinent Vitals/Pain Pain Assessment Pain Assessment: No/denies pain    Home Living Family/patient expects to be discharged to:: Private residence Living Arrangements: Spouse/significant other Available Help at Discharge: Family;Available 24 hours/day Type of Home: Apartment Home Access: Stairs to enter   Entrance Stairs-Number of Steps: 2   Home Layout: One level Home Equipment: None      Prior Function Prior Level of Function : Independent/Modified Independent             Mobility Comments: denies falls ADLs Comments: Wife changes LVAD dressing. Pt sponges at baseline. Pt manages his LVAD batteries. Pt management medication at baseline.     Extremity/Trunk Assessment   Upper Extremity Assessment Upper Extremity Assessment: Overall WFL for tasks assessed    Lower Extremity Assessment Lower Extremity Assessment: Overall WFL for tasks assessed    Cervical / Trunk Assessment Cervical / Trunk Assessment: Normal  Communication   Communication Communication: No apparent difficulties    Cognition Arousal: Alert Behavior During Therapy: WFL for tasks assessed/performed   PT - Cognitive impairments: No apparent impairments  Following commands: Intact       Cueing Cueing Techniques: Verbal cues     General Comments General comments (skin integrity, edema, etc.): pt manages LVAD equipment independently; carries batteries in back pockets of pants and controller on his belt.    Exercises     Assessment/Plan    PT Assessment Patient does not need any further PT services  PT Problem List         PT Treatment Interventions      PT Goals (Current goals can be found in the Care Plan section)  Acute Rehab PT Goals Patient Stated Goal: feel better PT Goal Formulation: All assessment and education  complete, DC therapy    Frequency       Co-evaluation               AM-PAC PT "6 Clicks" Mobility  Outcome Measure Help needed turning from your back to your side while in a flat bed without using bedrails?: None Help needed moving from lying on your back to sitting on the side of a flat bed without using bedrails?: None Help needed moving to and from a bed to a chair (including a wheelchair)?: None Help needed standing up from a chair using your arms (e.g., wheelchair or bedside chair)?: None Help needed to walk in hospital room?: None Help needed climbing 3-5 steps with a railing? : None 6 Click Score: 24    End of Session Equipment Utilized During Treatment: Oxygen Activity Tolerance: Patient tolerated treatment well Patient left: in bed;with call bell/phone within reach Nurse Communication: Mobility status;Other (comment) (required 2L O2) PT Visit Diagnosis: Difficulty in walking, not elsewhere classified (R26.2)    Time: 6578-4696 PT Time Calculation (min) (ACUTE ONLY): 29 min   Charges:   PT Evaluation $PT Eval Low Complexity: 1 Low PT Treatments $Gait Training: 8-22 mins PT General Charges $$ ACUTE PT VISIT: 1 Visit          Gayle Kava, PT Acute Rehabilitation Services  Office [phone] '  Gu[place]ford Leep 08/05/2023, 3:36 PM

## 2023-08-05 NOTE — Progress Notes (Signed)
 Advanced Heart Failure VAD Team Note  PCP-Cardiologist: Jules Oar, MD  Chief Complaint: Heart Failure/PNA Subjective:   4/10:  Possible PNA. Started on Ceftriaxone/azithromycin. Diuresed with IV lasix.   - 1.7L urine output  - Reports that he feels like a new man; much better today.    LVAD INTERROGATION:  HeartMate III LVAD:   Flow 5.2 liters/min, speed 5500, power 4.2 PI 3.5.  11 PI events  Objective:    Vital Signs:   Temp:  [97.8 F (36.6 C)-98.3 F (36.8 C)] 97.8 F (36.6 C) (04/12 1101) Pulse Rate:  [87-91] 87 (04/12 1101) Resp:  [17-20] 17 (04/12 1101) BP: (83-100)/(59-86) 96/59 (04/12 1101) SpO2:  [85 %-95 %] 94 % (04/12 1101) Weight:  [70.3 kg] 70.3 kg (04/12 0539) Last BM Date : 08/04/23 Mean arterial Pressure 90   Intake/Output:   Intake/Output Summary (Last 24 hours) at 08/05/2023 1144 Last data filed at 08/05/2023 1103 Gross per 24 hour  Intake 1354.58 ml  Output 1900 ml  Net -545.42 ml     Physical Exam    General:   comfortable; no distress Neck: supple. JVP 5-6 Cor: Mechanical heart sounds with LVAD hum present. Lungs: CTA  Abdomen: soft, nontender, nondistended.  Driveline: C/D/I; securement device intact and driveline incorporated Extremities: no cyanosis, clubbing, rash, no edema.  Neuro: alert & orientedx3,  moves all 4 extremities w/o difficulty. Affect pleasant   Telemetry  SR 80s    EKG    N/A  Labs   Basic Metabolic Panel: Recent Labs  Lab 08/03/23 0904 08/04/23 0220 08/05/23 0203  NA 135 137 135  K 3.7 3.5 3.8  CL 102 102 100  CO2 22 24 25   GLUCOSE 114* 99 109*  BUN 16 12 16   CREATININE 1.85* 1.66* 1.68*  CALCIUM 9.2 8.3* 8.4*  MG  --  1.8 2.0    Liver Function Tests: Recent Labs  Lab 08/03/23 0904  AST 22  ALT 16  ALKPHOS 102  BILITOT 1.6*  PROT 8.3*  ALBUMIN 2.8*   No results for input(s): "LIPASE", "AMYLASE" in the last 168 hours. No results for input(s): "AMMONIA" in the last 168  hours.  CBC: Recent Labs  Lab 08/03/23 0904 08/04/23 0220 08/05/23 0203  WBC 11.5* 8.5 9.0  HGB 13.1 11.0* 11.2*  HCT 41.6 34.9* 34.9*  MCV 78.8* 78.8* 78.1*  PLT 193 165 197    INR: Recent Labs  Lab 07/31/23 0000 08/03/23 0904 08/04/23 0220 08/05/23 0203  INR 2.7 2.8* 3.5* 3.9*    Other results: EKG:    Imaging   ECHOCARDIOGRAM COMPLETE Result Date: 08/04/2023    ECHOCARDIOGRAM REPORT   Patient Name:   David [name] Date of Exam: 08/04/2023 Medical Rec #:  [id number]        Height:       65.0 in Accession #:    [id number]       Weight:       153.9 lb Date of Birth:  1965/05/31        BSA:          1.770 m Patient Age:    57 years         BP:           94/79 mmHg Patient Gender: M                HR:           90 bpm. Exam Location:  Inpatient Procedure: 2D Echo, Color Doppler  and Cardiac Doppler (Both Spectral and Color            Flow Doppler were utilized during procedure). Indications:    I50.9* Heart failure (unspecified)  History:        Patient has prior history of Echocardiogram examinations, most                 recent 10/17/2018. CHF, Cardiomyopathy and LVAD;                 Arrythmias:Atrial Fibrillation.  Sonographer:    Andrena Bang Referring Phys: [id number] [place] LEE IMPRESSIONS  1. LVAD inflow cannula at apex. Left ventricular ejection fraction, by estimation, is <20%. The left ventricle has severely decreased function. The left ventricle demonstrates global hypokinesis. The left ventricular internal cavity size was moderately dilated. Left ventricular diastolic parameters are consistent with Grade II diastolic dysfunction (pseudonormalization). The interventricular septum is mid-line to mildly right shifted.  2. Right ventricular systolic function is mildly reduced. The right ventricular size is mildly enlarged. There is normal pulmonary artery systolic pressure. The estimated right ventricular systolic pressure is 33.6 mmHg.  3. Left atrial size was moderately dilated.   4. The mitral valve is normal in structure. Trivial mitral valve regurgitation. No evidence of mitral stenosis.  5. The aortic valve does not open. The aortic valve is abnormal. There is moderate calcification of the aortic valve. Aortic valve regurgitation is not visualized.  6. The inferior vena cava is dilated in size with >50% respiratory variability, suggesting right atrial pressure of 8 mmHg. FINDINGS  Left Ventricle: LVAD inflow cannula at apex. Left ventricular ejection fraction, by estimation, is <20%. The left ventricle has severely decreased function. The left ventricle demonstrates global hypokinesis. The left ventricular internal cavity size was moderately dilated. There is no left ventricular hypertrophy. Left ventricular diastolic parameters are consistent with Grade II diastolic dysfunction (pseudonormalization). Right Ventricle: The right ventricular size is mildly enlarged. No increase in right ventricular wall thickness. Right ventricular systolic function is mildly reduced. There is normal pulmonary artery systolic pressure. The tricuspid regurgitant velocity  is 2.53 m/s, and with an assumed right atrial pressure of 8 mmHg, the estimated right ventricular systolic pressure is 33.6 mmHg. Left Atrium: Left atrial size was moderately dilated. Right Atrium: Right atrial size was normal in size. Pericardium: There is no evidence of pericardial effusion. Mitral Valve: The mitral valve is normal in structure. Trivial mitral valve regurgitation. No evidence of mitral valve stenosis. Tricuspid Valve: The tricuspid valve is normal in structure. Tricuspid valve regurgitation is trivial. Aortic Valve: The aortic valve does not open. The aortic valve is abnormal. There is moderate calcification of the aortic valve. Aortic valve regurgitation is not visualized. Pulmonic Valve: The pulmonic valve was normal in structure. Pulmonic valve regurgitation is trivial. Aorta: The aortic root is normal in size and  structure. Venous: The inferior vena cava is dilated in size with greater than 50% respiratory variability, suggesting right atrial pressure of 8 mmHg. IAS/Shunts: No atrial level shunt detected by color flow Doppler. Additional Comments: A device lead is visualized in the right ventricle.  LEFT VENTRICLE PLAX 2D LVIDd:         6.00 cm   Diastology LVIDs:         5.90 cm   LV e' medial:   4.54 cm/s LV PW:         1.20 cm   LV E/e' medial: 18.5 LV IVS:  0.70 cm LVOT diam:     1.90 cm LVOT Area:     2.84 cm  RIGHT VENTRICLE RV S prime:     6.16 cm/s TAPSE (M-mode): 0.9 cm LEFT ATRIUM              Index LA diam:        5.10 cm  2.88 cm/m LA Vol (A2C):   117.0 ml 66.12 ml/m LA Vol (A4C):   59.3 ml  33.51 ml/m LA Biplane Vol: 87.4 ml  49.39 ml/m   AORTA Ao Asc diam: 3.40 cm MITRAL VALVE               TRICUSPID VALVE MV Area (PHT): 5.09 cm    TR Peak grad:   25.6 mmHg MV Decel Time: 149 msec    TR Vmax:        253.00 cm/s MV E velocity: 84.00 cm/s MV A velocity: 34.30 cm/s  SHUNTS MV E/A ratio:  2.45        Systemic Diam: 1.90 cm Dalton McleanMD Electronically signed by Archer Bear Signature Date/Time: 08/04/2023/10:55:59 AM    Final      Medications:     Scheduled Medications:  amiodarone  200 mg Oral Daily   amLODipine  5 mg Oral Daily   atorvastatin  40 mg Oral Daily   magnesium oxide  400 mg Oral BID   pantoprazole  40 mg Oral Daily   potassium chloride  40 mEq Oral BID   tamsulosin  0.4 mg Oral Daily   traZODone  50 mg Oral QHS   Warfarin - Pharmacist Dosing Inpatient   Does not apply q1600    Infusions:  azithromycin 500 mg (08/05/23 0936)   cefTRIAXone (ROCEPHIN)  IV Stopped (08/04/23 1352)   furosemide 120 mg (08/05/23 0829)    PRN Medications: acetaminophen, ondansetron (ZOFRAN) IV   Patient Profile  David [name] is a 58 y.o. male with history of ICD (09 PCI to LAD, mRCA, '10 PCI to Lcx and '12 mRCA), HFrEF (EF ~20% for > 5 years), HLD and HTN. S/P LVAD HM-III  04/20/18.   Admitted with acute hypoxic respiratory failure, possible PNA.    Assessment/Plan:   1. Acute Hypoxic Respiratory Failure: admitted earlier in the month with Flu A. ?PNA post viral infection. Also volume overloaded on exam. CXR with pulm congestion. On [place].  - Continue IV azithro/rochepin.  - WBC stable today at 9.5.  - Weaned O2 from 3L to 1L; O2 sats 92%-96%. Will monitor. May need to go back up.  - Euvolemic on exam now; received IV lasix 120mg  this AM. Will hold of on PM dose. Can transition to PO tomorrow. Increase in PI events today.    2 . Chronic systolic CHF with HM III LVAD implant 04/20/17 - Euvolemic on exam; hold PM lasix. Start PO tomorrow.    3. VAD management - VAD interrogated personally. Parameters stable. - INR supratherapeutic; discussed with pharmD.    4. CKD3: basleine 1.5-1.8 - sCr stable today.  - Daily BMET    5. Hx of Ventricular tachycardia - Had ICD shock x 2 for VT on 11/24 (device reprogrammed at the time) - S/p fracture lead removal and ICD exchange 1/25 - Continue amio 200 mg daily   6. CAD:  s/p PCI RCA/PLOM with DES x2 and DES to mild LAD 1/18 - No chest pain.  - Continue statin. Off ASA due to GIB   7. Urinary retention: reports intermittent dysuria in  the past week - Continue Flomax 0.4 mg daily - Low threshold for bladder scan   8. HTN - Stable. - continue norvasc 5 mg   Consult PT.   I reviewed the LVAD parameters from today, and compared the results to the patient's prior recorded data.  No programming changes were made.  The LVAD is functioning within specified parameters.  The patient performs LVAD self-test daily.  LVAD interrogation was negative for any significant power changes, alarms or PI events/speed drops.  LVAD equipment check completed and is in good working order.  Back-up equipment present.   LVAD education done on emergency procedures and precautions and reviewed exit site care.  Length of Stay: 2  Alwin Baars, DO 08/05/2023, 11:44 AM  VAD Team --- VAD ISSUES ONLY--- Pager [phone] (7am - 7am)  Advanced Heart Failure Team  Pager [phone] (M-F; 7a - 5p)  Please contact [facility] for night-coverage after hours (5p -7a ) and weekends on [email or web address]   Lutisha Knoche 08/05/2023 11:44 AM

## 2023-08-05 NOTE — Plan of Care (Signed)
  Problem: Education: Goal: Patient will understand all VAD equipment and how it functions Outcome: Progressing Goal: Patient will be able to verbalize current INR target range and antiplatelet therapy for discharge home Outcome: Progressing   Problem: Cardiac: Goal: LVAD will function as expected and patient will experience no clinical alarms Outcome: Progressing   Problem: Education: Goal: Knowledge of General Education information will improve Description: Including pain rating scale, medication(s)/side effects and non-pharmacologic comfort measures Outcome: Progressing   Problem: Health Behavior/Discharge Planning: Goal: Ability to manage health-related needs will improve Outcome: Progressing   Problem: Clinical Measurements: Goal: Ability to maintain clinical measurements within normal limits will improve Outcome: Progressing Goal: Will remain free from infection Outcome: Progressing Goal: Diagnostic test results will improve Outcome: Progressing Goal: Respiratory complications will improve Outcome: Progressing Goal: Cardiovascular complication will be avoided Outcome: Progressing   Problem: Activity: Goal: Risk for activity intolerance will decrease Outcome: Progressing   Problem: Nutrition: Goal: Adequate nutrition will be maintained Outcome: Progressing   Problem: Coping: Goal: Level of anxiety will decrease Outcome: Progressing   Problem: Elimination: Goal: Will not experience complications related to bowel motility Outcome: Progressing Goal: Will not experience complications related to urinary retention Outcome: Progressing   Problem: Pain Managment: Goal: General experience of comfort will improve and/or be controlled Outcome: Progressing   Problem: Safety: Goal: Ability to remain free from injury will improve Outcome: Progressing   Problem: Skin Integrity: Goal: Risk for impaired skin integrity will decrease Outcome: Progressing

## 2023-08-05 NOTE — Progress Notes (Signed)
 SATURATION QUALIFICATIONS: (This note is used to comply with regulatory documentation for home oxygen)  Patient Saturations on Room Air at Rest = 84%  Patient Saturations on Room Air while Ambulating = NT due to <88% at rest  Patient Saturations on 2 Liters of oxygen while Ambulating = 90%  Please briefly explain why patient needs home oxygen:  To maintain sats >87% during functional activity.     Gayle Kava, PT [facility]  Office [phone]

## 2023-08-06 DIAGNOSIS — Z95811 Presence of heart assist device: Secondary | ICD-10-CM | POA: Diagnosis not present

## 2023-08-06 DIAGNOSIS — J9601 Acute respiratory failure with hypoxia: Secondary | ICD-10-CM | POA: Diagnosis not present

## 2023-08-06 DIAGNOSIS — I5023 Acute on chronic systolic (congestive) heart failure: Secondary | ICD-10-CM

## 2023-08-06 LAB — BASIC METABOLIC PANEL WITH GFR
Anion gap: 11 (ref 5–15)
BUN: 15 mg/dL (ref 6–20)
CO2: 25 mmol/L (ref 22–32)
Calcium: 8.9 mg/dL (ref 8.9–10.3)
Chloride: 102 mmol/L (ref 98–111)
Creatinine, Ser: 1.67 mg/dL — ABNORMAL HIGH (ref 0.61–1.24)
GFR, Estimated: 47 mL/min — ABNORMAL LOW (ref >60)
Glucose, Bld: 147 mg/dL — ABNORMAL HIGH (ref 70–99)
Potassium: 3.7 mmol/L (ref 3.5–5.1)
Sodium: 138 mmol/L (ref 135–145)

## 2023-08-06 LAB — PROTIME-INR
INR: 3 — ABNORMAL HIGH (ref 0.8–1.2)
Prothrombin Time: 31.1 s — ABNORMAL HIGH (ref 11.4–15.2)

## 2023-08-06 LAB — CBC
HCT: 35.3 % — ABNORMAL LOW (ref 39.0–52.0)
Hemoglobin: 11.3 g/dL — ABNORMAL LOW (ref 13.0–17.0)
MCH: 25.3 pg — ABNORMAL LOW (ref 26.0–34.0)
MCHC: 32 g/dL (ref 30.0–36.0)
MCV: 79 fL — ABNORMAL LOW (ref 80.0–100.0)
Platelets: 202 10*3/uL (ref 150–400)
RBC: 4.47 MIL/uL (ref 4.22–5.81)
RDW: 16.1 % — ABNORMAL HIGH (ref 11.5–15.5)
WBC: 7.6 10*3/uL (ref 4.0–10.5)
nRBC: 0 % (ref 0.0–0.2)

## 2023-08-06 LAB — MAGNESIUM: Magnesium: 1.9 mg/dL (ref 1.7–2.4)

## 2023-08-06 LAB — LACTATE DEHYDROGENASE: LDH: 200 U/L — ABNORMAL HIGH (ref 98–192)

## 2023-08-06 MED ORDER — MAGNESIUM SULFATE IN D5W 1-5 GM/100ML-% IV SOLN
1.0000 g | Freq: Once | INTRAVENOUS | Status: AC
  Administered 2023-08-06: 1 g via INTRAVENOUS
  Filled 2023-08-06: qty 100

## 2023-08-06 NOTE — Progress Notes (Signed)
 PHARMACY - ANTICOAGULATION CONSULT NOTE  Pharmacy Consult for warfarin Indication:  LVAD  Allergies  Allergen Reactions   Plavix [Clopidogrel Bisulfate] Hives    Patient Measurements: Height: 5\' 5"  (165.1 cm) Weight: 70.2 kg (154 lb 12.8 oz) (Scale B) IBW/kg (Calculated) : 61.5 HEPARIN DW (KG): 74.4  Vital Signs: Temp: 98.4 F (36.9 C) (04/13 1116) Temp Source: Oral (04/13 1116) BP: 88/76 (04/13 1116) Pulse Rate: 85 (04/13 1116)  Labs: Recent Labs    08/04/23 0220 08/05/23 0203 08/06/23 0156  HGB 11.0* 11.2* 11.3*  HCT 34.9* 34.9* 35.3*  PLT 165 197 202  LABPROT 35.6* 38.3* 31.1*  INR 3.5* 3.9* 3.0*  CREATININE 1.66* 1.68* 1.67*    Estimated Creatinine Clearance: 41.4 mL/min (A) (by C-G formula based on SCr of 1.67 mg/dL (H)).   Medical History: Past Medical History:  Diagnosis Date   AICD (automatic cardioverter/defibrillator) present    Anemia    Anxiety    CAD (coronary artery disease) 2009   AMI in 12/2007 with PCI to LAD, staged PCI to  mid/distal RCA, NSTEMI in 02/2009 with BMS to LCx   CHF (congestive heart failure) (HCC)    Chronic kidney disease 11/03/2016   stage 3 kidney disease   Dysrhythmia    "arrythmia problems at some point", "fatal rhythms"   History of blood transfusion    "I was bleeding from was rectum" (04/19/2017)   HLD (hyperlipidemia)    HTN (hypertension)    Hyperlipidemia 03/15/2011   Ischemic cardiomyopathy    Admitted in 07/2010 with CHF exacerbation   Myocardial infarction (HCC)    "I've had 4" (04/19/2017)   Pneumonia 2018 X 1   Seizures (HCC)    one seizure in 04/2016 during cardiac event (04/19/2017)   STEMI 2009 (anterior), 2010 (lateral), and 2012 (inferior) 03/14/2011    Medications:  Scheduled:   amiodarone  200 mg Oral Daily   amLODipine  5 mg Oral Daily   atorvastatin  40 mg Oral Daily   magnesium oxide  400 mg Oral BID   pantoprazole  40 mg Oral Daily   potassium chloride  40 mEq Oral Daily   tamsulosin   0.4 mg Oral Daily   traZODone  50 mg Oral QHS   Warfarin - Pharmacist Dosing Inpatient   Does not apply q1600    Assessment: 57 yom presenting with hypoxia. Hx of LVAD HM3 in 2019 - on warfarin PTA. Last dose on 4/8 - PTA regimen is 1 mg daily except 2 mg MWF.   INR remains subtherapeutic today but is now trending down, should be able to resume tomorrow.  Goal of Therapy:  INR 1.5-2 Monitor platelets by anticoagulation protocol: Yes   Plan:  Continue to hold warfarin until INR closer to goal Monitor daily INR, CBC, and for s/sx of bleeding    Levin Reamer, PharmD, BCPS, [facility] Clinical Pharmacist [phone] Please check AMION for all [facility] Pharmacy numbers 08/06/2023

## 2023-08-06 NOTE — Progress Notes (Signed)
 Per Patient wife forgot to change LVAD dressing today and will change it tomorrow. Dressing is Clean dry and intact.

## 2023-08-06 NOTE — Progress Notes (Addendum)
 Advanced Heart Failure VAD Team Note  PCP-Cardiologist: Jules Oar, MD  Chief Complaint: Heart Failure/PNA Subjective:   4/10:  Possible PNA. Started on Ceftriaxone/azithromycin. Diuresed with IV lasix.   Feels great. No CP or SOB. IV lasix stopped yesterday.   LVAD INTERROGATION:  HeartMate III LVAD:   Flow 5.2 liters/min, speed 5500, power 4.0 PI 3.3.    Objective:    Vital Signs:   Temp:  [97.6 F (36.4 C)-98.7 F (37.1 C)] 98.4 F (36.9 C) (04/13 1116) Pulse Rate:  [82-97] 85 (04/13 1116) Resp:  [17-20] 18 (04/13 0633) BP: (85-107)/(65-81) 88/76 (04/13 1116) SpO2:  [91 %-94 %] 91 % (04/13 0633) Weight:  [70.2 kg] 70.2 kg (04/13 0633) Last BM Date : 08/04/23 Mean arterial Pressure 80-90s  Intake/Output:   Intake/Output Summary (Last 24 hours) at 08/06/2023 1219 Last data filed at 08/06/2023 0746 Gross per 24 hour  Intake 577.32 ml  Output 1100 ml  Net -522.68 ml     Physical Exam    General:  NAD.  HEENT: normal  Neck: supple. JVP not elevated.  Carotids 2+ bilat; no bruits. No lymphadenopathy or thryomegaly appreciated. Cor: LVAD hum.  Lungs: Clear. Abdomen: obese soft, nontender, non-distended. No hepatosplenomegaly. No bruits or masses. Good bowel sounds. Driveline site clean. Anchor in place.  Extremities: no cyanosis, clubbing, rash. Warm no edema  Neuro: alert & oriented x 3. No focal deficits. Moves all 4 without problem    Telemetry   Sinus 80s Personally reviewed   Labs   Basic Metabolic Panel: Recent Labs  Lab 08/03/23 0904 08/04/23 0220 08/05/23 0203 08/06/23 0156  NA 135 137 135 138  K 3.7 3.5 3.8 3.7  CL 102 102 100 102  CO2 22 24 25 25   GLUCOSE 114* 99 109* 147*  BUN 16 12 16 15   CREATININE 1.85* 1.66* 1.68* 1.67*  CALCIUM 9.2 8.3* 8.4* 8.9  MG  --  1.8 2.0 1.9    Liver Function Tests: Recent Labs  Lab 08/03/23 0904  AST 22  ALT 16  ALKPHOS 102  BILITOT 1.6*  PROT 8.3*  ALBUMIN 2.8*   No results for  input(s): "LIPASE", "AMYLASE" in the last 168 hours. No results for input(s): "AMMONIA" in the last 168 hours.  CBC: Recent Labs  Lab 08/03/23 0904 08/04/23 0220 08/05/23 0203 08/06/23 0156  WBC 11.5* 8.5 9.0 7.6  HGB 13.1 11.0* 11.2* 11.3*  HCT 41.6 34.9* 34.9* 35.3*  MCV 78.8* 78.8* 78.1* 79.0*  PLT 193 165 197 202    INR: Recent Labs  Lab 07/31/23 0000 08/03/23 0904 08/04/23 0220 08/05/23 0203 08/06/23 0156  INR 2.7 2.8* 3.5* 3.9* 3.0*    Other results:   Imaging   No results found.    Medications:     Scheduled Medications:  amiodarone  200 mg Oral Daily   amLODipine  5 mg Oral Daily   atorvastatin  40 mg Oral Daily   magnesium oxide  400 mg Oral BID   pantoprazole  40 mg Oral Daily   potassium chloride  40 mEq Oral Daily   tamsulosin  0.4 mg Oral Daily   traZODone  50 mg Oral QHS   Warfarin - Pharmacist Dosing Inpatient   Does not apply q1600    Infusions:  azithromycin 500 mg (08/06/23 1042)   cefTRIAXone (ROCEPHIN)  IV Stopped (08/05/23 1229)   magnesium sulfate bolus IVPB      PRN Medications: acetaminophen, ondansetron (ZOFRAN) IV   Patient Profile  David Houseman  [name] is a 58 y.o. male with history of ICD (09 PCI to LAD, mRCA, '10 PCI to Lcx and '12 mRCA), HFrEF (EF ~20% for > 5 years), HLD and HTN. S/P LVAD HM-III 04/20/18.   Admitted with acute hypoxic respiratory failure, possible PNA.    Assessment/Plan:    1. Acute Hypoxic Respiratory Failure: admitted earlier in the month with Flu A. ?PNA post viral infection. Also volume overloaded on exam on admit. CXR with pulm congestion. On [place].  - On IV azithro/rochepin (day 4/7) likely can switch to augmentin tomorrow - WBC down to 7.6 today - Volume status optimized. Urine dark. Will not start po lasix yet   2 . Chronic systolic CHF with HM III LVAD implant 04/20/17 - Volume status looks good - resume home regimen   3. VAD management - VAD interrogated personally. Parameters  stable. - INR 3.0 Goal 2.0-2.5 Discussed warfarin dosing with PharmD personally. - LDH 200 - DL site ok - Hgb 96.0 (stable)   4. CKD3: basleine 1.5-1.8 - sCr stable 1.67 today   5. Hx of Ventricular tachycardia - Had ICD shock x 2 for VT on 11/24 (device reprogrammed at the time) - S/p fracture lead removal and ICD exchange 1/25 - No VT  - Continue amio 200 mg daily   6. CAD:  s/p PCI RCA/PLOM with DES x2 and DES to mild LAD 1/18 - No s/s angina - Continue statin. Off ASA due to GIB   7. Urinary retention: reports intermittent dysuria in the past week - Continue Flomax 0.4 mg daily   8. HTN - Stable. - continue norvasc 5 mg    I reviewed the LVAD parameters from today, and compared the results to the patient's prior recorded data.  No programming changes were made.  The LVAD is functioning within specified parameters.  The patient performs LVAD self-test daily.  LVAD interrogation was negative for any significant power changes, alarms or PI events/speed drops.  LVAD equipment check completed and is in good working order.  Back-up equipment present.   LVAD education done on emergency procedures and precautions and reviewed exit site care.  Length of Stay: 3  Jules Oar, MD 08/06/2023, 12:19 PM  VAD Team --- VAD ISSUES ONLY--- Pager [phone] (7am - 7am)  Advanced Heart Failure Team  Pager [phone] (M-F; 7a - 5p)  Please contact [facility] for night-coverage after hours (5p -7a ) and weekends on amion.[name] 08/06/2023 12:19 PM

## 2023-08-06 NOTE — Plan of Care (Signed)
  Problem: Education: Goal: Patient will understand all VAD equipment and how it functions Outcome: Progressing Goal: Patient will be able to verbalize current INR target range and antiplatelet therapy for discharge home Outcome: Progressing   Problem: Cardiac: Goal: LVAD will function as expected and patient will experience no clinical alarms Outcome: Progressing   Problem: Education: Goal: Knowledge of General Education information will improve Description: Including pain rating scale, medication(s)/side effects and non-pharmacologic comfort measures Outcome: Progressing   Problem: Health Behavior/Discharge Planning: Goal: Ability to manage health-related needs will improve Outcome: Progressing   Problem: Clinical Measurements: Goal: Diagnostic test results will improve Outcome: Progressing Goal: Cardiovascular complication will be avoided Outcome: Progressing   Problem: Activity: Goal: Risk for activity intolerance will decrease Outcome: Progressing   Problem: Nutrition: Goal: Adequate nutrition will be maintained Outcome: Progressing   Problem: Coping: Goal: Level of anxiety will decrease Outcome: Progressing

## 2023-08-06 NOTE — Plan of Care (Signed)
  Problem: Education: Goal: Patient will understand all VAD equipment and how it functions Outcome: Progressing Goal: Patient will be able to verbalize current INR target range and antiplatelet therapy for discharge home Outcome: Progressing   Problem: Cardiac: Goal: LVAD will function as expected and patient will experience no clinical alarms Outcome: Progressing   Problem: Education: Goal: Knowledge of General Education information will improve Description: Including pain rating scale, medication(s)/side effects and non-pharmacologic comfort measures Outcome: Progressing   Problem: Health Behavior/Discharge Planning: Goal: Ability to manage health-related needs will improve Outcome: Progressing   Problem: Clinical Measurements: Goal: Ability to maintain clinical measurements within normal limits will improve Outcome: Progressing Goal: Will remain free from infection Outcome: Progressing Goal: Diagnostic test results will improve Outcome: Progressing Goal: Respiratory complications will improve Outcome: Progressing Goal: Cardiovascular complication will be avoided Outcome: Progressing   Problem: Activity: Goal: Risk for activity intolerance will decrease Outcome: Progressing   Problem: Nutrition: Goal: Adequate nutrition will be maintained Outcome: Progressing   Problem: Coping: Goal: Level of anxiety will decrease Outcome: Progressing   Problem: Elimination: Goal: Will not experience complications related to bowel motility Outcome: Progressing Goal: Will not experience complications related to urinary retention Outcome: Progressing   Problem: Pain Managment: Goal: General experience of comfort will improve and/or be controlled Outcome: Progressing   Problem: Safety: Goal: Ability to remain free from injury will improve Outcome: Progressing   Problem: Skin Integrity: Goal: Risk for impaired skin integrity will decrease Outcome: Progressing

## 2023-08-07 DIAGNOSIS — J9601 Acute respiratory failure with hypoxia: Secondary | ICD-10-CM | POA: Diagnosis not present

## 2023-08-07 DIAGNOSIS — I5023 Acute on chronic systolic (congestive) heart failure: Secondary | ICD-10-CM | POA: Diagnosis not present

## 2023-08-07 DIAGNOSIS — Z95811 Presence of heart assist device: Secondary | ICD-10-CM | POA: Diagnosis not present

## 2023-08-07 LAB — BASIC METABOLIC PANEL WITH GFR
Anion gap: 10 (ref 5–15)
BUN: 13 mg/dL (ref 6–20)
CO2: 22 mmol/L (ref 22–32)
Calcium: 8.8 mg/dL — ABNORMAL LOW (ref 8.9–10.3)
Chloride: 105 mmol/L (ref 98–111)
Creatinine, Ser: 1.41 mg/dL — ABNORMAL HIGH (ref 0.61–1.24)
GFR, Estimated: 57 mL/min — ABNORMAL LOW (ref >60)
Glucose, Bld: 94 mg/dL (ref 70–99)
Potassium: 4.2 mmol/L (ref 3.5–5.1)
Sodium: 137 mmol/L (ref 135–145)

## 2023-08-07 LAB — CBC
HCT: 34.4 % — ABNORMAL LOW (ref 39.0–52.0)
Hemoglobin: 10.8 g/dL — ABNORMAL LOW (ref 13.0–17.0)
MCH: 24.8 pg — ABNORMAL LOW (ref 26.0–34.0)
MCHC: 31.4 g/dL (ref 30.0–36.0)
MCV: 78.9 fL — ABNORMAL LOW (ref 80.0–100.0)
Platelets: 206 10*3/uL (ref 150–400)
RBC: 4.36 MIL/uL (ref 4.22–5.81)
RDW: 15.9 % — ABNORMAL HIGH (ref 11.5–15.5)
WBC: 6.9 10*3/uL (ref 4.0–10.5)
nRBC: 0 % (ref 0.0–0.2)

## 2023-08-07 LAB — MAGNESIUM: Magnesium: 2 mg/dL (ref 1.7–2.4)

## 2023-08-07 LAB — PROTIME-INR
INR: 3 — ABNORMAL HIGH (ref 0.8–1.2)
Prothrombin Time: 31.2 s — ABNORMAL HIGH (ref 11.4–15.2)

## 2023-08-07 LAB — LACTATE DEHYDROGENASE: LDH: 179 U/L (ref 98–192)

## 2023-08-07 MED ORDER — AMOXICILLIN-POT CLAVULANATE 875-125 MG PO TABS
1.0000 | ORAL_TABLET | Freq: Two times a day (BID) | ORAL | Status: AC
Start: 2023-08-07 — End: 2023-08-10
  Administered 2023-08-07 – 2023-08-09 (×6): 1 via ORAL
  Filled 2023-08-07 (×6): qty 1

## 2023-08-07 MED ORDER — FUROSEMIDE 10 MG/ML IJ SOLN
80.0000 mg | Freq: Once | INTRAMUSCULAR | Status: AC
  Administered 2023-08-07: 80 mg via INTRAVENOUS
  Filled 2023-08-07: qty 8

## 2023-08-07 MED ORDER — FUROSEMIDE 40 MG PO TABS
40.0000 mg | ORAL_TABLET | Freq: Every day | ORAL | Status: DC
  Administered 2023-08-07 – 2023-08-10 (×4): 40 mg via ORAL
  Filled 2023-08-07 (×4): qty 1

## 2023-08-07 MED ORDER — SODIUM CHLORIDE 0.9 % IV SOLN: INTRAVENOUS | Status: DC

## 2023-08-07 NOTE — Progress Notes (Signed)
 PHARMACY - ANTICOAGULATION CONSULT NOTE  Pharmacy Consult for warfarin Indication:  LVAD  Allergies  Allergen Reactions   Plavix [Clopidogrel Bisulfate] Hives    Patient Measurements: Height: 5\' 5"  (165.1 cm) Weight: 70.1 kg (154 lb 8.7 oz) IBW/kg (Calculated) : 61.5 HEPARIN DW (KG): 74.4  Vital Signs: Temp: 97.7 F (36.5 C) (04/14 0817) Temp Source: Oral (04/14 0817) BP: 96/71 (04/14 0824)  Labs: Recent Labs    08/05/23 0203 08/06/23 0156 08/07/23 0211  HGB 11.2* 11.3* 10.8*  HCT 34.9* 35.3* 34.4*  PLT 197 202 206  LABPROT 38.3* 31.1* 31.2*  INR 3.9* 3.0* 3.0*  CREATININE 1.68* 1.67* 1.41*    Estimated Creatinine Clearance: 49.1 mL/min (A) (by C-G formula based on SCr of 1.41 mg/dL (H)).   Medical History: Past Medical History:  Diagnosis Date   AICD (automatic cardioverter/defibrillator) present    Anemia    Anxiety    CAD (coronary artery disease) 2009   AMI in 12/2007 with PCI to LAD, staged PCI to  mid/distal RCA, NSTEMI in 02/2009 with BMS to LCx   CHF (congestive heart failure) (HCC)    Chronic kidney disease 11/03/2016   stage 3 kidney disease   Dysrhythmia    "arrythmia problems at some point", "fatal rhythms"   History of blood transfusion    "I was bleeding from was rectum" (04/19/2017)   HLD (hyperlipidemia)    HTN (hypertension)    Hyperlipidemia 03/15/2011   Ischemic cardiomyopathy    Admitted in 07/2010 with CHF exacerbation   Myocardial infarction (HCC)    "I've had 4" (04/19/2017)   Pneumonia 2018 X 1   Seizures (HCC)    one seizure in 04/2016 during cardiac event (04/19/2017)   STEMI 2009 (anterior), 2010 (lateral), and 2012 (inferior) 03/14/2011    Medications:  Scheduled:   amiodarone  200 mg Oral Daily   amLODipine  5 mg Oral Daily   atorvastatin  40 mg Oral Daily   magnesium oxide  400 mg Oral BID   pantoprazole  40 mg Oral Daily   potassium chloride  40 mEq Oral Daily   tamsulosin  0.4 mg Oral Daily   traZODone  50 mg  Oral QHS   Warfarin - Pharmacist Dosing Inpatient   Does not apply q1600    Assessment: 57 yom presenting with hypoxia. Hx of LVAD HM3 in 2019 - on warfarin PTA. Last dose on 4/8 - PTA regimen is 1 mg daily except 2 mg MWF.   INR remains subtherapeutic today at 3.  Goal of Therapy:  INR 1.5-2 Monitor platelets by anticoagulation protocol: Yes   Plan:  Continue to hold warfarin until INR closer to goal Monitor daily INR, CBC, and for s/sx of bleeding   Cheryll Corti, [facility] Clinical Pharmacist  08/07/2023 9:06 AM   [facility] pharmacy phone numbers are listed on [email or web address]

## 2023-08-07 NOTE — H&P (View-Only) (Signed)
 Advanced Heart Failure VAD Team Note  PCP-Cardiologist: Arvilla Meres, MD  Chief Complaint: Heart Failure/PNA Subjective:    MAP 80s. Weight stable. Sitting up in chair. Feeling okay. Occasional cough and SOB but overall improved.  LVAD INTERROGATION:  HeartMate III LVAD:   Flow 5.2 liters/min, speed 5500, power 4.2 PI 3.0    Objective:    Vital Signs:   Temp:  [97.7 F (36.5 C)-98.6 F (37 C)] 97.7 F (36.5 C) (04/14 0817) Pulse Rate:  [85-90] 90 (04/13 1608) Resp:  [17-23] 18 (04/14 0817) BP: (88-118)/(71-92) 96/71 (04/14 0824) SpO2:  [91 %-97 %] 93 % (04/14 0817) Weight:  [70.1 kg] 70.1 kg (04/14 0628) Last BM Date : 08/04/23  Mean arterial Pressure 80-90s  Intake/Output:  Intake/Output Summary (Last 24 hours) at 08/07/2023 0901 Last data filed at 08/07/2023 0653 Gross per 24 hour  Intake 590 ml  Output 550 ml  Net 40 ml    Physical Exam    General: Well appearing. No distress on [facility] Cardiac: JVP ~10cm. Mechanical heart sounds with LVAD hum present.  Driveline: Dressing C/D/I. No drainage or redness. Anchor in place. Extremities: Warm and dry. No peripheral edema. Neuro: Alert and oriented x3. Affect pleasant. Moves all extremities without difficulty.  Telemetry   SR in 90s, occasional PVCs (personally reviewed)  Labs   Basic Metabolic Panel: Recent Labs  Lab 08/03/23 0904 08/04/23 0220 08/05/23 0203 08/06/23 0156 08/07/23 0211  NA 135 137 135 138 137  K 3.7 3.5 3.8 3.7 4.2  CL 102 102 100 102 105  CO2 22 24 25 25 22   GLUCOSE 114* 99 109* 147* 94  BUN 16 12 16 15 13   CREATININE 1.85* 1.66* 1.68* 1.67* 1.41*  CALCIUM 9.2 8.3* 8.4* 8.9 8.8*  MG  --  1.8 2.0 1.9 2.0   Liver Function Tests: Recent Labs  Lab 08/03/23 0904  AST 22  ALT 16  ALKPHOS 102  BILITOT 1.6*  PROT 8.3*  ALBUMIN 2.8*   CBC: Recent Labs  Lab 08/03/23 0904 08/04/23 0220 08/05/23 0203 08/06/23 0156 08/07/23 0211  WBC 11.5* 8.5 9.0 7.6 6.9  HGB 13.1 11.0*  11.2* 11.3* 10.8*  HCT 41.6 34.9* 34.9* 35.3* 34.4*  MCV 78.8* 78.8* 78.1* 79.0* 78.9*  PLT 193 165 197 202 206   INR: Recent Labs  Lab 08/03/23 0904 08/04/23 0220 08/05/23 0203 08/06/23 0156 08/07/23 0211  INR 2.8* 3.5* 3.9* 3.0* 3.0*   Imaging   No results found.  Medications:    Scheduled Medications:  amiodarone  200 mg Oral Daily   amLODipine  5 mg Oral Daily   atorvastatin  40 mg Oral Daily   magnesium oxide  400 mg Oral BID   pantoprazole  40 mg Oral Daily   potassium chloride  40 mEq Oral Daily   tamsulosin  0.4 mg Oral Daily   traZODone  50 mg Oral QHS   Warfarin - Pharmacist Dosing Inpatient   Does not apply q1600   Infusions:  azithromycin 500 mg (08/06/23 1042)   cefTRIAXone (ROCEPHIN)  IV 2 g (08/06/23 1240)   PRN Medications: acetaminophen, ondansetron (ZOFRAN) IV  Patient Profile   CHRISHUN SCHEER is a 58 y.o. male with history of ICD (09 PCI to LAD, mRCA, '10 PCI to Lcx and '12 mRCA), HFrEF (EF ~20% for > 5 years), HLD and HTN. S/P LVAD HM-III 04/20/18.   Admitted with acute hypoxic respiratory failure, possible PNA.   Assessment/Plan:    1. Acute Hypoxic Respiratory  Failure: admitted earlier in the month with Flu A. ?PNA post viral infection. Also volume overloaded on exam on admit. CXR with pulm congestion. On [place].  - Stop azithro/rochepin. Switch to Augmentin PO. - WBC down to 6.9 - diuresis per #2   2 . Chronic systolic CHF with HM III LVAD implant 04/20/17 - Volume up on exam, JVP elevated.  - start Lasix 40 mg PO   3. VAD management - VAD interrogated personally. Parameters stable. - INR 3.0 Goal 2.0-2.5. Discussed warfarin dosing with PharmD - LDH and hgb stable - DL site ok   4. CKD3: basleine 1.5-1.8 - sCr stable 1.41   5. Hx of Ventricular tachycardia - Had ICD shock x 2 for VT on 11/24 (device reprogrammed at the time) - S/p fracture lead removal and ICD exchange 1/25 - Continue amio 200 mg daily   6. CAD:  s/p PCI  RCA/PLOM with DES x2 and DES to mild LAD 1/18 - No s/s angina - Continue statin. Off ASA due to GIB   7. Urinary retention: reports intermittent dysuria in the past week - Continue Flomax 0.4 mg daily   8. HTN: Stable - continue norvasc 5 mg   I reviewed the LVAD parameters from today, and compared the results to the patient's prior recorded data.  No programming changes were made.  The LVAD is functioning within specified parameters.  The patient performs LVAD self-test daily.  LVAD interrogation was negative for any significant power changes, alarms or PI events/speed drops.  LVAD equipment check completed and is in good working order.  Back-up equipment present.   LVAD education done on emergency procedures and precautions and reviewed exit site care.  Length of Stay: 4  [place] Lee, NP 08/07/2023, 9:01 AM  VAD Team --- VAD ISSUES ONLY--- Pager [phone] (7am - 7am)  Advanced Heart Failure Team  Pager [phone] (M-F; 7a - 5p)  Please contact [facility] for night-coverage after hours (5p -7a ) and weekends on [email or web address]  Patient seen and examined with the above-signed Advanced Practice Provider and/or Housestaff. I personally reviewed laboratory data, imaging studies and relevant notes. I independently examined the patient and formulated the important aspects of the plan. I have edited the note to reflect any of my changes or salient points. I have personally discussed the plan with the patient and/or family.  Says he is feeling better. Denies SOB, orthopnea or PND. INR 3.0. VAD interrogated personally. Parameters stable.  General:  NAD.  HEENT: normal  Neck: supple. JVP to jaw with prominent CV waves.  Carotids 2+ bilat; no bruits. No lymphadenopathy or thryomegaly appreciated. Cor: LVAD hum.  Lungs: Clear. Abdomen: obese soft, nontender, non-distended. No hepatosplenomegaly. No bruits or masses. Good bowel sounds. Driveline site clean. Anchor in place.  Extremities: no cyanosis,  clubbing, rash. Warm no edema  Neuro: alert & oriented x 3. No focal deficits. Moves all 4 without problem   Symptomatically much improved but JVP still elevated and ReDS up as well.  Will plan IV diuresis today then RHC in am to optimize VAD speed/parameters  Discussed warfarin dosing with PharmD personally.  Switch abx to po.   Jules Oar, MD  1:19 PM

## 2023-08-07 NOTE — Plan of Care (Signed)
  Problem: Education: Goal: Patient will understand all VAD equipment and how it functions Outcome: Progressing Goal: Patient will be able to verbalize current INR target range and antiplatelet therapy for discharge home Outcome: Progressing   Problem: Cardiac: Goal: LVAD will function as expected and patient will experience no clinical alarms Outcome: Progressing   Problem: Education: Goal: Knowledge of General Education information will improve Description: Including pain rating scale, medication(s)/side effects and non-pharmacologic comfort measures Outcome: Progressing   Problem: Health Behavior/Discharge Planning: Goal: Ability to manage health-related needs will improve Outcome: Progressing   Problem: Clinical Measurements: Goal: Ability to maintain clinical measurements within normal limits will improve Outcome: Progressing Goal: Will remain free from infection Outcome: Progressing Goal: Diagnostic test results will improve Outcome: Progressing Goal: Respiratory complications will improve Outcome: Progressing Goal: Cardiovascular complication will be avoided Outcome: Progressing   Problem: Activity: Goal: Risk for activity intolerance will decrease Outcome: Progressing   Problem: Nutrition: Goal: Adequate nutrition will be maintained Outcome: Progressing   Problem: Coping: Goal: Level of anxiety will decrease Outcome: Progressing   Problem: Elimination: Goal: Will not experience complications related to bowel motility Outcome: Progressing Goal: Will not experience complications related to urinary retention Outcome: Progressing   Problem: Pain Managment: Goal: General experience of comfort will improve and/or be controlled Outcome: Progressing   Problem: Safety: Goal: Ability to remain free from injury will improve Outcome: Progressing   Problem: Skin Integrity: Goal: Risk for impaired skin integrity will decrease Outcome: Progressing

## 2023-08-07 NOTE — Care Management Important Message (Signed)
 Important Message  Patient Details  Name: David [name] MRN: [id number] Date of Birth: 1965-07-09   Important Message Given:        Wynonia Hedges 08/07/2023, 3:17 PM

## 2023-08-07 NOTE — Progress Notes (Signed)
   08/07/23 1734  Mobility  Activity Stood at bedside (STS x20)  Level of Assistance Standby assist, set-up cues, supervision of patient - no hands on  Assistive Device None  Range of Motion/Exercises Right leg;Left leg  Activity Response Tolerated well  Mobility Referral Yes  Mobility visit 1 Mobility  Mobility Specialist Start Time (ACUTE ONLY) 1721  Mobility Specialist Stop Time (ACUTE ONLY) 1734  Mobility Specialist Time Calculation (min) (ACUTE ONLY) 13 min   Mobility Specialist: Progress Note  Pre-Mobility:      HR 89 During Mobility: HR 90-93, SpO2 92-93% RA Post-Mobility:    HR 94  Pt agreeable to mobility session - received in bed. Pt was asymptomatic throughout session with no complaints. Returned to EOB with all needs met - call bell within reach.   Exercises: STS 4 sets x 5 reps Heel Raises (x10) Marches (x10) Leg extensions (x10)   David [name], BS Mobility Specialist Please contact via SecureChat or  Rehab office at [phone].

## 2023-08-07 NOTE — Discharge Summary (Addendum)
 Advanced Heart Failure Team  Discharge Summary   Patient ID: David [name] MRN: [id number], DOB/AGE: 05-28-1965 58 y.o. Admit date: 08/03/2023 D/C date:     08/11/2023   Primary Discharge Diagnoses:  Acute hypoxic respiratory failure Acute on chronic systolic heart failure HM3 LVAD  Secondary Discharge Diagnoses:  CKD3a H/o VT CAD Urinary retention HTN  Hospital Course:  David [name] is a 58 y.o. male with history of ICD (09 PCI to LAD, mRCA, '10 PCI to Lcx and '12 mRCA), HFrEF (EF ~20% for > 5 years), HLD and HTN. S/p LVAD HM-III 04/20/18.    Recent admission for hypovolemia, AKI and flu A 03/25. Re-admitted from VAD clinic for hypoxia and volume overload on 08/03/23. CXR with evidence of PNA, started on IV azithro/rochepin and IV Lasix . He was then transistioned to Augmentin  for pneumonia. Still did not look optimized from a volume/VAD perspective so taken for RHC which showed well optimized hemodynamics.  VAD speed increased to 5600.   Had persistent hypoxia despite adequate diuresis. Pulmonary consulted. Hi-Res chest CT w/ GG airspace disease in right lung, pulmonary edema, ATX left base, emphysema. Some concern for aspiration. Passed swallow evaluation. No evidence of amiodarone  toxicity. Continued diuresis. Plan to recheck CT 4-6 weeks to better sort out acute vs chronic changes.   Home O2 set up at discharge. He is being discharge on 2 liters [place] at rest and 4 liters with exertion. Anticipate weaning over time.  DME oxygen delivered to the home.   Seen and deemed appropriate for discharge today by Dr. Julane Ny with close follow up in VAD Clinic.   Hospital Course by Problem: 1. Acute Hypoxic Respiratory Failure: admitted earlier in the month with Flu A. ?PNA post viral infection. Also volume overloaded on exam on admit. CXR with pulm congestion. Hi Res CT with ground glass airspace disease in right lung, pulmonary edema, ATX left base, emphysema.  Oxygen requirements higher  on admit but gradually improved.   - Completed antibiotic course.    2 . Acute on Chronic systolic CHF with HM III LVAD implant 12/18 -  RHC 4/15: RA 6, PA mean 35, PCWP 19, Fick CI 3.1, TD CI 3.0  - Diuresed with IV lasix  and transitioned to torsemide  20 mg daily. Overall diuresed 15 pounds.  - Renal function remained stable.  -GDMT limited by CKD.    3. VAD management - INR stable at discharge. Goal 2.0-2.5.  - LDH stable.  -VAD parameters stable.    4. CKD3a: baseline 1.5-1.8 - sCr stable 1.5 -Renal function followed daily.    5. Hx of Ventricular tachycardia - Had ICD shock x 2 for VT on 11/24 (device reprogrammed at the time) - S/p fracture lead removal and ICD exchange 1/25 - Off amio for now.    6. CAD:  s/p PCI RCA/PLOM with DES x2 and DES to mild LAD 1/18 - Continue statin. Off ASA due to GIB   7. Urinary retention: continue Flomax  0.4 mg daily   8. HTN: Stable: continue norvasc  5 mg   Discharge Physical Exam: Physical Exam: GENERAL: No acute distress. HEENT: normal  NECK: Supple, JVP  6-7.  2+ bilaterally, no bruits.  No lymphadenopathy or thyromegaly appreciated.   CARDIAC:  Mechanical heart sounds with LVAD hum present.  LUNGS:  Clear to auscultation bilaterally. On 2 liters [place]  ABDOMEN:  Soft, round, nontender, positive bowel sounds x4.     LVAD exit site:  Dressing dry and intact.  No erythema or  drainage.  Stabilization device present and accurately applied.  EXTREMITIES:  Warm and dry, no cyanosis, clubbing, rash or edema  NEUROLOGIC:  Alert and oriented x 3.    No aphasia.  No dysarthria.  Affect pleasant.    Discharge Weight:  149 pounds.  Discharge Vitals: Blood pressure 101/82, pulse 74, temperature 97.6 F (36.4 C), temperature source Oral, resp. rate 19, height 5\' 5"  (1.651 m), weight 67.6 kg, SpO2 97%.  Labs: Lab Results  Component Value Date   WBC 14.6 (H) 08/11/2023   HGB 12.1 (L) 08/11/2023   HCT 37.7 (L) 08/11/2023   MCV 79.0 (L)  08/11/2023   PLT 290 08/11/2023    Recent Labs  Lab 08/11/23 0639  NA 137  K 4.0  CL 101  CO2 27  BUN 24*  CREATININE 1.48*  CALCIUM  9.4  GLUCOSE 97   Lab Results  Component Value Date   CHOL 178 04/02/2018   HDL 42 04/02/2018   LDLCALC 111 (H) 04/02/2018   TRIG 124 04/02/2018   BNP (last 3 results) Recent Labs    08/03/23 0904  BNP 470.3*    ProBNP (last 3 results) No results for input(s): "PROBNP" in the last 8760 hours.   Diagnostic Studies/Procedures   DG Swallowing Func-Speech Pathology Result Date: 08/11/2023 Table formatting from the original result was not included. Modified Barium Swallow Study Patient Details Name: David [name] MRN: [id number] Date of Birth: [birth year] Today's Date: 08/11/2023 HPI/PMH: HPI: 58 y.o. male presented 4/10 with acute hypoxic respiratory failure. CXR done, which showed pulmonary edema. He was given IV lasix  and started on IV azithromycin  and rochephin for suspected pneumonia. Recent CT chest from 04/15 displayed "Heterogeneous and ground-glass airspace disease throughout the dependent right lung, concerning for infection or aspiration."   PMH significant of ICD, HFrEF (EF ~20% for > 5 years), HLD and HTN. S/P LVAD HM-III 04/20/18. Clinical Impression: Clinical Impression: Pt's swallow is grossly WFL. No oral phase deficits were noted. Pharyngeal swallow appears safe and efficient for swallowing across every consistency tested. Trace retention and retrograde flow was noted in mid-distal of esophagus during esophageal sweep. Recommend no changes to current diet and pt does not need further acute SLP services. Factors that may increase risk of adverse event in presence of aspiration Roderick Civatte & Jessy Morocco 2021): Factors that may increase risk of adverse event in presence of aspiration Roderick Civatte & Jessy Morocco 2021): Respiratory or GI disease Recommendations/Plan: Swallowing Evaluation Recommendations Swallowing Evaluation Recommendations Recommendations: PO  diet PO Diet Recommendation: Regular; Thin liquids (Level 0) Liquid Administration via: Cup; Straw Medication Administration: Whole meds with liquid Supervision: Patient able to self-feed Oral care recommendations: Oral care BID (2x/day) Treatment Plan Treatment Plan Treatment recommendations: No treatment recommended at this time Follow-up recommendations: No SLP follow up Recommendations Recommendations for follow up therapy are one component of a multi-disciplinary discharge planning process, led by the attending physician.  Recommendations may be updated based on patient status, additional functional criteria and insurance authorization. Assessment: Orofacial Exam: Orofacial Exam Oral Cavity - Dentition: Adequate natural dentition Anatomy: Anatomy: WFL Boluses Administered: Boluses Administered Boluses Administered: Thin liquids (Level 0); Puree; Solid  Oral Impairment Domain: Oral Impairment Domain Lip Closure: No labial escape Tongue control during bolus hold: Cohesive bolus between tongue to palatal seal Bolus preparation/mastication: Timely and efficient chewing and mashing Bolus transport/lingual motion: Brisk tongue motion Oral residue: Complete oral clearance Location of oral residue : N/A Initiation of pharyngeal swallow : Pyriform sinuses  Pharyngeal Impairment Domain: Pharyngeal Impairment  Domain Soft palate elevation: No bolus between soft palate (SP)/pharyngeal wall (PW) Laryngeal elevation: Complete superior movement of thyroid  cartilage with complete approximation of arytenoids to epiglottic petiole Anterior hyoid excursion: Complete anterior movement Epiglottic movement: Complete inversion Laryngeal vestibule closure: Complete, no air/contrast in laryngeal vestibule Pharyngeal stripping wave : Present - complete Pharyngeal contraction (A/P view only): N/A Pharyngoesophageal segment opening: Complete distension and complete duration, no obstruction of flow Tongue base retraction: No contrast  between tongue base and posterior pharyngeal wall (PPW) Pharyngeal residue: Complete pharyngeal clearance Location of pharyngeal residue: N/A  Esophageal Impairment Domain: Esophageal Impairment Domain Esophageal clearance upright position: Esophageal retention with retrograde flow below pharyngoesophageal segment (PES) Pill: Pill Consistency administered: Thin liquids (Level 0) Thin liquids (Level 0): [facility] Penetration/Aspiration Scale Score: Penetration/Aspiration Scale Score 1.  Material does not enter airway: Thin liquids (Level 0); Puree; Solid; Pill Compensatory Strategies: Compensatory Strategies Compensatory strategies: No   General Information: Caregiver present: No  Diet Prior to this Study: Regular; Thin liquids (Level 0)   Temperature : Normal   Respiratory Status: WFL   Supplemental O2: Nasal cannula   History of Recent Intubation: No  Behavior/Cognition: Alert; Cooperative; Pleasant mood Self-Feeding Abilities: Able to self-feed Baseline vocal quality/speech: Normal Volitional Cough: Able to elicit Volitional Swallow: Able to elicit Exam Limitations: No limitations Goal Planning: No data recorded No data recorded No data recorded No data recorded Consulted and agree with results and recommendations: Patient Pain: Pain Assessment Pain Assessment: No/denies pain End of Session: Start Time:SLP Start Time (ACUTE ONLY): 1300 Stop Time: SLP Stop Time (ACUTE ONLY): 1317 Time Calculation:SLP Time Calculation (min) (ACUTE ONLY): 17 min Charges: SLP Evaluations $ SLP Speech Visit: 1 Visit SLP Evaluations $MBS Swallow: 1 Procedure SLP visit diagnosis: No data recorded Past Medical History: Past Medical History: Diagnosis Date  AICD (automatic cardioverter/defibrillator) present   Anemia   Anxiety   CAD (coronary artery disease) 2009  AMI in 12/2007 with PCI to LAD, staged PCI to  mid/distal RCA, NSTEMI in 02/2009 with BMS to LCx  CHF (congestive heart failure) (HCC)   Chronic kidney disease 11/03/2016  stage 3  kidney disease  Dysrhythmia   "arrythmia problems at some point", "fatal rhythms"  History of blood transfusion   "I was bleeding from was rectum" (04/19/2017)  HLD (hyperlipidemia)   HTN (hypertension)   Hyperlipidemia 03/15/2011  Ischemic cardiomyopathy   Admitted in 07/2010 with CHF exacerbation  Myocardial infarction (HCC)   "I've had 4" (04/19/2017)  Pneumonia 2018 X 1  Seizures (HCC)   one seizure in 04/2016 during cardiac event (04/19/2017)  STEMI 2009 (anterior), 2010 (lateral), and 2012 (inferior) 03/14/2011 Past Surgical History: Past Surgical History: Procedure Laterality Date  AORTIC VALVE REPAIR N/A 04/20/2017  Procedure: AORTIC VALVE REPAIR;  Surgeon: Bartley Lightning, MD;  Location: [facility] OR;  Service: Open Heart Surgery;  Laterality: N/A;  BIV ICD INSERTION CRT-D N/A 08/01/2016  Procedure: BiV ICD Insertion CRT-D;  Surgeon: Will Cortland Ding, MD;  Location: [facility] INVASIVE CV LAB;  Service: Cardiovascular;  Laterality: N/A;  CARDIAC CATHETERIZATION N/A 05/05/2016  Procedure: Right/Left Heart Cath and Coronary Angiography;  Surgeon: Mardell Shade, MD;  Location: [facility] INVASIVE CV LAB;  Service: Cardiovascular;  Laterality: N/A;  CARDIAC CATHETERIZATION N/A 05/09/2016  Procedure: Coronary Stent Intervention;  Surgeon: Peter M Swaziland, MD;  Location: [facility] INVASIVE CV LAB;  Service: Cardiovascular;  Laterality: N/A;  CARDIAC CATHETERIZATION N/A 05/09/2016  Procedure: Intravascular Pressure Wire/FFR Study;  Surgeon: Peter M Swazil[place], MD;  Location:  [facility] INVASIVE CV LAB;  Service: Cardiovascular;  Laterality: N/A;  CARDIOVERSION N/A 10/17/2018  Procedure: CARDIOVERSION;  Surgeon: Mardell Shade, MD;  Location: [facility] ENDOSCOPY;  Service: Cardiovascular;  Laterality: N/A;  CORONARY ANGIOPLASTY WITH STENT PLACEMENT    "i've got a total of 12 stents" (04/19/2017)  ELECTROPHYSIOLOGY STUDY N/A 06/24/2016  Procedure: Electrophysiology Study;  Surgeon: Verona Goodwill, MD;  Location: [facility] INVASIVE CV LAB;  Service:  Cardiovascular;  Laterality: N/A;  ENTEROSCOPY N/A 06/03/2017  Procedure: ENTEROSCOPY;  Surgeon: Ace Holder, MD;  Location: [facility] ENDOSCOPY;  Service: Gastroenterology;  Laterality: N/A;  EPICARDIAL PACING LEAD PLACEMENT Left 11/07/2016  Procedure: LV EPICARDIAL PACING LEAD PLACEMENT VIA LEFT MINI THORACOTOMY;  Surgeon: Bartley Lightning, MD;  Location: [facility] OR;  Service: Thoracic;  Laterality: Left;  ICD IMPLANT N/A 06/24/2016  Procedure: POSSIBLE  ICD Implant;  Surgeon: Verona Goodwill, MD;  Location: [facility] INVASIVE CV LAB;  Service: Cardiovascular;  Laterality: N/A;  INSERTION OF IMPLANTABLE LEFT VENTRICULAR ASSIST DEVICE N/A 04/20/2017  Procedure: INSERTION OF IMPLANTABLE LEFT VENTRICULAR ASSIST DEVICE, Aortic Valve repair;  Surgeon: Bartley Lightning, MD;  Location: [facility] OR;  Service: Open Heart Surgery;  Laterality: N/A;  Heartmate 3  IR FLUORO GUIDE CV LINE RIGHT  05/08/2017  IR FLUORO GUIDE CV MIDLINE PICC RIGHT  03/14/2017  IR REMOVAL TUN CV CATH W/O FL  05/25/2017  IR US  GUIDE VASC ACCESS RIGHT  03/14/2017  IR US  GUIDE VASC ACCESS RIGHT  05/08/2017  LEAD EXTRACTION N/A 05/24/2023  Procedure: LEAD EXTRACTION;  Surgeon: Boyce Byes, MD;  Location: [facility] INVASIVE CV LAB;  Service: Cardiovascular;  Laterality: N/A;  LEFT HEART CATHETERIZATION WITH CORONARY ANGIOGRAM N/A 03/13/2011  Procedure: LEFT HEART CATHETERIZATION WITH CORONARY ANGIOGRAM;  Surgeon: Avanell Leigh, MD;  Location: [facility] CATH LAB;  Service: Cardiovascular;  Laterality: N/A;  MULTIPLE EXTRACTIONS WITH ALVEOLOPLASTY N/A 04/12/2017  Procedure: Extraction of tooth #'s 2, 5-12, 17, 20-29 with alveoloplasty amd maxillary right buccal exostoses reductions.;  Surgeon: Carol Chroman, DDS;  Location: [facility] OR;  Service: Oral Surgery;  Laterality: N/A;  RIGHT HEART CATH N/A 04/19/2017  Procedure: RIGHT HEART CATH;  Surgeon: Mardell Shade, MD;  Location: [facility] INVASIVE CV LAB;  Service: Cardiovascular;  Laterality: N/A;  RIGHT HEART CATH N/A  08/08/2023  Procedure: RIGHT HEART CATH;  Surgeon: Mardell Shade, MD;  Location: [facility] INVASIVE CV LAB;  Service: Cardiovascular;  Laterality: N/A;  RIGHT/LEFT HEART CATH AND CORONARY ANGIOGRAPHY N/A 03/10/2017  Procedure: RIGHT/LEFT HEART CATH AND CORONARY ANGIOGRAPHY;  Surgeon: Mardell Shade, MD;  Location: [facility] INVASIVE CV LAB;  Service: Cardiovascular;  Laterality: N/A;  TEE WITHOUT CARDIOVERSION N/A 04/20/2017  Procedure: TRANSESOPHAGEAL ECHOCARDIOGRAM (TEE);  Surgeon: Bartley Lightning, MD;  Location: [facility] OR;  Service: Open Heart Surgery;  Laterality: N/A;  TEE WITHOUT CARDIOVERSION N/A 10/17/2018  Procedure: TRANSESOPHAGEAL ECHOCARDIOGRAM (TEE);  Surgeon: Mardell Shade, MD;  Location: [facility] ENDOSCOPY;  Service: Cardiovascular;  Laterality: N/A;  TRANSESOPHAGEAL ECHOCARDIOGRAM (CATH LAB) N/A 05/24/2023  Procedure: TRANSESOPHAGEAL ECHOCARDIOGRAM;  Surgeon: Boyce Byes, MD;  Location: [facility] INVASIVE CV LAB;  Service: Cardiovascular;  Laterality: N/A; DeBlois, Hardin Leys 08/11/2023, 8:01 AM   Discharge Medications   Allergies as of 08/11/2023       Reactions   Plavix [clopidogrel Bisulfate] Hives        Medication List     STOP taking these medications    amiodarone  200 MG tablet Commonly known as: PACERONE    furosemide  20 MG tablet Commonly  known as: LASIX        TAKE these medications    acetaminophen  500 MG tablet Commonly known as: TYLENOL  Take 1,000 mg by mouth 2 (two) times daily as needed for moderate pain (pain score 4-6), fever or headache.   amLODipine  5 MG tablet Commonly known as: NORVASC  Take 1 tablet (5 mg total) by mouth daily.   atorvastatin  40 MG tablet Commonly known as: LIPITOR Take 1 tablet (40 mg total) by mouth daily.   fluticasone  50 MCG/ACT nasal spray Commonly known as: FLONASE  Place 2 sprays into both nostrils daily. What changed:  when to take this reasons to take this   magnesium  oxide 400 (240 Mg) MG tablet Commonly known  as: MAG-OX Take 1 tablet (400 mg total) by mouth 2 (two) times daily.   Mens 50+ Multivitamin Tabs Take 1 tablet by mouth daily.   pantoprazole  40 MG tablet Commonly known as: PROTONIX  Take 1 tablet (40 mg total) by mouth daily.   tamsulosin  0.4 MG Caps capsule Commonly known as: FLOMAX  Take 1 capsule (0.4 mg total) by mouth daily.   torsemide  20 MG tablet Commonly known as: DEMADEX  Take 1 tablet (20 mg total) by mouth daily. Start taking on: August 12, 2023   traZODone  50 MG tablet Commonly known as: DESYREL  Take 1 tablet (50 mg total) by mouth at bedtime.   warfarin 2 MG tablet Commonly known as: COUMADIN  Take as directed. If you are unsure how to take this medication, talk to your nurse or doctor. Original instructions: TAKE 1 MG daily per VAD Pharm D What changed: additional instructions               Durable Medical Equipment  (From admission, onward)           Start     Ordered   08/11/23 1149  For home use only DME oxygen  Once       Comments: please evaluate for POC Heart Failure, LVAD  Question Answer Comment  Length of Need 12 Months   Mode or (Route) Nasal cannula   Liters per Minute 4   Frequency Continuous (stationary and portable oxygen unit needed)   Oxygen conserving device Yes   Oxygen delivery system Gas      08/11/23 1148   08/10/23 1238  For home use only DME oxygen  Once       Comments: Heart Failure  Question Answer Comment  Length of Need 12 Months   Mode or (Route) Nasal cannula   Liters per Minute 6   Frequency Continuous (stationary and portable oxygen unit needed)   Oxygen delivery system Gas      08/10/23 1238            Disposition   The patient will be discharged in stable condition to home. Discharge Instructions     Diet - low sodium heart healthy   Complete by: As directed    Heart Failure patients record your daily weight using the same scale at the same time of day   Complete by: As directed    INR   Goal: 2 - 2.5   Complete by: As directed    Goal: 2 - 2.5   Increase activity slowly   Complete by: As directed    Page VAD Coordinator at [phone]  Notify for: any VAD alarms, sustained elevations of power >10 watts, sustained drop in Pulse Index <3   Complete by: As directed    Notify for:  any VAD  alarms sustained elevations of power >10 watts sustained drop in Pulse Index <3     Speed Settings:   Complete by: As directed    Fixed 5600 RPM Low 5300 RPM       Follow-up Information     Abbe Hoard., MD Follow up.   Specialty: Internal Medicine Why: please call to arrange hospital follow appt  in 7-14 days Contact information: [address] [place] [address] [phone]                   APP Duration of Time Discharge Encounter: 15 minutes   Signed, Destina Mantei NP-C  08/11/2023, 12:46 PM   Patient seen and examined with the above-signed Advanced Practice Provider and/or Housestaff. I personally reviewed laboratory data, imaging studies and relevant notes. I independently examined the patient and formulated the important aspects of the plan. I have edited the note to reflect any of my changes or salient points. I have personally discussed the plan with the patient and/or family.  See my progress note from earlier today.   He is improved. Stable to go home today. Home O2 arranged.   Will follow closely in VAD Clinic.    MD Duration of Time Discharge Encounter: 40 mins  Jules Oar, MD  2:36 PM

## 2023-08-07 NOTE — Progress Notes (Addendum)
 Advanced Heart Failure VAD Team Note  PCP-Cardiologist: Arvilla Meres, MD  Chief Complaint: Heart Failure/PNA Subjective:    MAP 80s. Weight stable. Sitting up in chair. Feeling okay. Occasional cough and SOB but overall improved.  LVAD INTERROGATION:  HeartMate III LVAD:   Flow 5.2 liters/min, speed 5500, power 4.2 PI 3.0    Objective:    Vital Signs:   Temp:  [97.7 F (36.5 C)-98.6 F (37 C)] 97.7 F (36.5 C) (04/14 0817) Pulse Rate:  [85-90] 90 (04/13 1608) Resp:  [17-23] 18 (04/14 0817) BP: (88-118)/(71-92) 96/71 (04/14 0824) SpO2:  [91 %-97 %] 93 % (04/14 0817) Weight:  [70.1 kg] 70.1 kg (04/14 0628) Last BM Date : 08/04/23  Mean arterial Pressure 80-90s  Intake/Output:  Intake/Output Summary (Last 24 hours) at 08/07/2023 0901 Last data filed at 08/07/2023 0653 Gross per 24 hour  Intake 590 ml  Output 550 ml  Net 40 ml    Physical Exam    General: Well appearing. No distress on [place] Cardiac: JVP ~10cm. Mechanical heart sounds with LVAD hum present.  Driveline: Dressing C/D/I. No drainage or redness. Anchor in place. Extremities: Warm and dry. No peripheral edema. Neuro: Alert and oriented x3. Affect pleasant. Moves all extremities without difficulty.  Telemetry   SR in 90s, occasional PVCs (personally reviewed)  Labs   Basic Metabolic Panel: Recent Labs  Lab 08/03/23 0904 08/04/23 0220 08/05/23 0203 08/06/23 0156 08/07/23 0211  NA 135 137 135 138 137  K 3.7 3.5 3.8 3.7 4.2  CL 102 102 100 102 105  CO2 22 24 25 25 22   GLUCOSE 114* 99 109* 147* 94  BUN 16 12 16 15 13   CREATININE 1.85* 1.66* 1.68* 1.67* 1.41*  CALCIUM 9.2 8.3* 8.4* 8.9 8.8*  MG  --  1.8 2.0 1.9 2.0   Liver Function Tests: Recent Labs  Lab 08/03/23 0904  AST 22  ALT 16  ALKPHOS 102  BILITOT 1.6*  PROT 8.3*  ALBUMIN 2.8*   CBC: Recent Labs  Lab 08/03/23 0904 08/04/23 0220 08/05/23 0203 08/06/23 0156 08/07/23 0211  WBC 11.5* 8.5 9.0 7.6 6.9  HGB 13.1 11.0*  11.2* 11.3* 10.8*  HCT 41.6 34.9* 34.9* 35.3* 34.4*  MCV 78.8* 78.8* 78.1* 79.0* 78.9*  PLT 193 165 197 202 206   INR: Recent Labs  Lab 08/03/23 0904 08/04/23 0220 08/05/23 0203 08/06/23 0156 08/07/23 0211  INR 2.8* 3.5* 3.9* 3.0* 3.0*   Imaging   No results found.  Medications:    Scheduled Medications:  amiodarone  200 mg Oral Daily   amLODipine  5 mg Oral Daily   atorvastatin  40 mg Oral Daily   magnesium oxide  400 mg Oral BID   pantoprazole  40 mg Oral Daily   potassium chloride  40 mEq Oral Daily   tamsulosin  0.4 mg Oral Daily   traZODone  50 mg Oral QHS   Warfarin - Pharmacist Dosing Inpatient   Does not apply q1600   Infusions:  azithromycin 500 mg (08/06/23 1042)   cefTRIAXone (ROCEPHIN)  IV 2 g (08/06/23 1240)   PRN Medications: acetaminophen, ondansetron (ZOFRAN) IV  Patient Profile   CHRISHUN SCHEER is a 58 y.o. male with history of ICD (09 PCI to LAD, mRCA, '10 PCI to Lcx and '12 mRCA), HFrEF (EF ~20% for > 5 years), HLD and HTN. S/P LVAD HM-III 04/20/18.   Admitted with acute hypoxic respiratory failure, possible PNA.   Assessment/Plan:    1. Acute Hypoxic Respiratory  Failure: admitted earlier in the month with Flu A. ?PNA post viral infection. Also volume overloaded on exam on admit. CXR with pulm congestion. On [place].  - Stop azithro/rochepin. Switch to Augmentin PO. - WBC down to 6.9 - diuresis per #2   2 . Chronic systolic CHF with HM III LVAD implant 04/20/17 - Volume up on exam, JVP elevated.  - start Lasix 40 mg PO   3. VAD management - VAD interrogated personally. Parameters stable. - INR 3.0 Goal 2.0-2.5. Discussed warfarin dosing with PharmD - LDH and hgb stable - DL site ok   4. CKD3: basleine 1.5-1.8 - sCr stable 1.41   5. Hx of Ventricular tachycardia - Had ICD shock x 2 for VT on 11/24 (device reprogrammed at the time) - S/p fracture lead removal and ICD exchange 1/25 - Continue amio 200 mg daily   6. CAD:  s/p PCI  RCA/PLOM with DES x2 and DES to mild LAD 1/18 - No s/s angina - Continue statin. Off ASA due to GIB   7. Urinary retention: reports intermittent dysuria in the past week - Continue Flomax 0.4 mg daily   8. HTN: Stable - continue norvasc 5 mg   I reviewed the LVAD parameters from today, and compared the results to the patient's prior recorded data.  No programming changes were made.  The LVAD is functioning within specified parameters.  The patient performs LVAD self-test daily.  LVAD interrogation was negative for any significant power changes, alarms or PI events/speed drops.  LVAD equipment check completed and is in good working order.  Back-up equipment present.   LVAD education done on emergency procedures and precautions and reviewed exit site care.  Length of Stay: 4  [place] Lee, NP 08/07/2023, 9:01 AM  VAD Team --- VAD ISSUES ONLY--- Pager [phone] (7am - 7am)  Advanced Heart Failure Team  Pager [phone] (M-F; 7a - 5p)  Please contact [facility] for night-coverage after hours (5p -7a ) and weekends on [email or web address]  Patient seen and examined with the above-signed Advanced Practice Provider and/or Housestaff. I personally reviewed laboratory data, imaging studies and relevant notes. I independently examined the patient and formulated the important aspects of the plan. I have edited the note to reflect any of my changes or salient points. I have personally discussed the plan with the patient and/or family.  Says he is feeling better. Denies SOB, orthopnea or PND. INR 3.0. VAD interrogated personally. Parameters stable.  General:  NAD.  HEENT: normal  Neck: supple. JVP to jaw with prominent CV waves.  Carotids 2+ bilat; no bruits. No lymphadenopathy or thryomegaly appreciated. Cor: LVAD hum.  Lungs: Clear. Abdomen: obese soft, nontender, non-distended. No hepatosplenomegaly. No bruits or masses. Good bowel sounds. Driveline site clean. Anchor in place.  Extremities: no cyanosis,  clubbing, rash. Warm no edema  Neuro: alert & oriented x 3. No focal deficits. Moves all 4 without problem   Symptomatically much improved but JVP still elevated and ReDS up as well.  Will plan IV diuresis today then RHC in am to optimize VAD speed/parameters  Discussed warfarin dosing with PharmD personally.  Switch abx to po.   Jules Oar, MD  1:19 PM

## 2023-08-07 NOTE — Progress Notes (Signed)
 LVAD Coordinator Rounding Note:  Admitted 08/04/23 following a hospital follow up in VAD Clinic. Pt reported increased bloating and SOB since Tuesday. SPO2 79% on RA. Pt placed on 3L [place] in VAD Clinic and admitted to [facility].   HM3 LVAD implanted on 04/20/17 by Dr Sherene Dilling under destination therapy criteria.  Pt sitting up in recliner. States he is feeling much better. Denies shortness of breath currently.   Azithromycin and Rocephin stopped today. Transitioned to Augmentin.   INR remains elevated- 3.0 today. Coumadin remains on hold until closer to goal (1.5 - 2.0).   Plan for RHC tomorrow.   Vital signs: Temp: 97.7 HR: 86 Doppler Pressure: 90 Automatic BP: 96/71 (81) O2 Sat: 93% on 2L [place] Wt:  169.7>165>153.9>154.5 lbs    LVAD interrogation reveals:  Speed: 5500 Flow: 5.3 Power: 4.2 w PI: 3.3   Alarms: none Events:  none Hematocrit: 43  Fixed speed: 5400 Low speed limit: 5100  Drive Line: Existing dressing clean, dry, and intact. Anchor correctly applied. Weekly dressing maintained by pt's wife at home. Weekly dressing changes using weekly kit by wife Tarri Farm. Next dressing change due: today. Pt's wife told bedside RN she preferred to perform dressing change and will do it today when she arrives today.    Labs:  LDH trend: 169>179  INR trend: 3.5>3.0  Cr trend: 1.66>1.41  WBC trend: 8.5>6.9  Anticoagulation Plan: -INR Goal: 1.5-2.0 -ASA Dose: none - Coumadin: on HOLD  Device: -Medtronic BiV -Therapies: ON  Arrythmias: Hx Afib/AF- on Amiodarone 200 mg daily  Plan/Recommendations:  1. Page VAD coordinator with any drive line or equipment concerns 2. Weekly drive line dressing changes by bedside RN  Paulo Bosworth RN VAD Coordinator  Office: [phone]  24/7 Pager: [phone]

## 2023-08-07 NOTE — Progress Notes (Signed)
 REDS Clip  READING (normal 20-35%) = 42 x 2  CHEST RULER (in) =29 Clip Station =  A  Did Reds clip reading x 2, both readings at 42. Dr. Bensimhon and [place] Lee, NP notified.   Randie Bustle, BSN, Scientist, clinical (histocompatibility and immunogenetics) Only

## 2023-08-08 ENCOUNTER — Encounter (HOSPITAL_COMMUNITY): Payer: Self-pay | Admitting: Internal Medicine

## 2023-08-08 ENCOUNTER — Inpatient Hospital Stay (HOSPITAL_COMMUNITY)

## 2023-08-08 ENCOUNTER — Encounter (HOSPITAL_COMMUNITY)
Admission: AD | Disposition: A | Discharge status: Home / Self Care | Payer: Self-pay | Source: Ambulatory Visit | Attending: Internal Medicine

## 2023-08-08 DIAGNOSIS — D509 Iron deficiency anemia, unspecified: Secondary | ICD-10-CM

## 2023-08-08 DIAGNOSIS — I5041 Acute combined systolic (congestive) and diastolic (congestive) heart failure: Secondary | ICD-10-CM | POA: Diagnosis not present

## 2023-08-08 DIAGNOSIS — J9601 Acute respiratory failure with hypoxia: Secondary | ICD-10-CM | POA: Diagnosis not present

## 2023-08-08 HISTORY — PX: RIGHT HEART CATH: CATH118263

## 2023-08-08 LAB — CBC
HCT: 36.2 % — ABNORMAL LOW (ref 39.0–52.0)
Hemoglobin: 11.4 g/dL — ABNORMAL LOW (ref 13.0–17.0)
MCH: 24.7 pg — ABNORMAL LOW (ref 26.0–34.0)
MCHC: 31.5 g/dL (ref 30.0–36.0)
MCV: 78.4 fL — ABNORMAL LOW (ref 80.0–100.0)
Platelets: 235 10*3/uL (ref 150–400)
RBC: 4.62 MIL/uL (ref 4.22–5.81)
RDW: 15.9 % — ABNORMAL HIGH (ref 11.5–15.5)
WBC: 7.6 10*3/uL (ref 4.0–10.5)
nRBC: 0 % (ref 0.0–0.2)

## 2023-08-08 LAB — SEDIMENTATION RATE: Sed Rate: 18 mm/h — ABNORMAL HIGH (ref 0–16)

## 2023-08-08 LAB — BASIC METABOLIC PANEL WITH GFR
Anion gap: 9 (ref 5–15)
BUN: 14 mg/dL (ref 6–20)
CO2: 26 mmol/L (ref 22–32)
Calcium: 9 mg/dL (ref 8.9–10.3)
Chloride: 103 mmol/L (ref 98–111)
Creatinine, Ser: 1.74 mg/dL — ABNORMAL HIGH (ref 0.61–1.24)
GFR, Estimated: 45 mL/min — ABNORMAL LOW (ref >60)
Glucose, Bld: 92 mg/dL (ref 70–99)
Potassium: 3.9 mmol/L (ref 3.5–5.1)
Sodium: 138 mmol/L (ref 135–145)

## 2023-08-08 LAB — POCT I-STAT EG7
Acid-Base Excess: 1 mmol/L (ref 0.0–2.0)
Acid-Base Excess: 3 mmol/L — ABNORMAL HIGH (ref 0.0–2.0)
Bicarbonate: 25.7 mmol/L (ref 20.0–28.0)
Bicarbonate: 27.6 mmol/L (ref 20.0–28.0)
Calcium, Ion: 1.2 mmol/L (ref 1.15–1.40)
Calcium, Ion: 1.24 mmol/L (ref 1.15–1.40)
HCT: 38 % — ABNORMAL LOW (ref 39.0–52.0)
HCT: 38 % — ABNORMAL LOW (ref 39.0–52.0)
Hemoglobin: 12.9 g/dL — ABNORMAL LOW (ref 13.0–17.0)
Hemoglobin: 12.9 g/dL — ABNORMAL LOW (ref 13.0–17.0)
O2 Saturation: 58 %
O2 Saturation: 62 %
Potassium: 4 mmol/L (ref 3.5–5.1)
Potassium: 4.2 mmol/L (ref 3.5–5.1)
Sodium: 140 mmol/L (ref 135–145)
Sodium: 141 mmol/L (ref 135–145)
TCO2: 27 mmol/L (ref 22–32)
TCO2: 29 mmol/L (ref 22–32)
pCO2, Ven: 39.2 mmHg — ABNORMAL LOW (ref 44–60)
pCO2, Ven: 41.1 mmHg — ABNORMAL LOW (ref 44–60)
pH, Ven: 7.424 (ref 7.25–7.43)
pH, Ven: 7.435 — ABNORMAL HIGH (ref 7.25–7.43)
pO2, Ven: 30 mmHg — CL (ref 32–45)
pO2, Ven: 31 mmHg — CL (ref 32–45)

## 2023-08-08 LAB — LACTATE DEHYDROGENASE: LDH: 169 U/L (ref 98–192)

## 2023-08-08 LAB — PROTIME-INR
INR: 2.4 — ABNORMAL HIGH (ref 0.8–1.2)
Prothrombin Time: 26.2 s — ABNORMAL HIGH (ref 11.4–15.2)

## 2023-08-08 LAB — MAGNESIUM: Magnesium: 1.8 mg/dL (ref 1.7–2.4)

## 2023-08-08 SURGERY — RIGHT HEART CATH
Anesthesia: LOCAL

## 2023-08-08 MED ORDER — PREDNISONE 20 MG PO TABS
60.0000 mg | ORAL_TABLET | Freq: Every day | ORAL | Status: AC
  Administered 2023-08-09 – 2023-08-10 (×2): 60 mg via ORAL
  Filled 2023-08-08 (×2): qty 3

## 2023-08-08 MED ORDER — ACETAMINOPHEN 325 MG PO TABS: 650.0000 mg | ORAL_TABLET | ORAL | Status: DC | PRN

## 2023-08-08 MED ORDER — SODIUM CHLORIDE 0.9 % IV SOLN: 250.0000 mL | INTRAVENOUS | Status: AC | PRN

## 2023-08-08 MED ORDER — MAGNESIUM SULFATE 2 GM/50ML IV SOLN
2.0000 g | Freq: Once | INTRAVENOUS | Status: AC
  Administered 2023-08-08: 2 g via INTRAVENOUS
  Filled 2023-08-08: qty 50

## 2023-08-08 MED ORDER — HYDRALAZINE HCL 20 MG/ML IJ SOLN: 10.0000 mg | INTRAMUSCULAR | Status: AC | PRN

## 2023-08-08 MED ORDER — SODIUM CHLORIDE 0.9% FLUSH: 3.0000 mL | INTRAVENOUS | Status: DC | PRN

## 2023-08-08 MED ORDER — HEPARIN (PORCINE) IN NACL 1000-0.9 UT/500ML-% IV SOLN
INTRAVENOUS | Status: DC | PRN
  Administered 2023-08-08: 500 mL

## 2023-08-08 MED ORDER — LABETALOL HCL 5 MG/ML IV SOLN: 10.0000 mg | INTRAVENOUS | Status: AC | PRN

## 2023-08-08 MED ORDER — LIDOCAINE HCL (PF) 1 % IJ SOLN
INTRAMUSCULAR | Status: DC | PRN
Start: 2023-08-08 — End: 2023-08-08
  Administered 2023-08-08: 2 mL

## 2023-08-08 MED ORDER — ONDANSETRON HCL 4 MG/2ML IJ SOLN: 4.0000 mg | Freq: Four times a day (QID) | INTRAMUSCULAR | Status: DC | PRN

## 2023-08-08 MED ORDER — SODIUM CHLORIDE 0.9% FLUSH
3.0000 mL | Freq: Two times a day (BID) | INTRAVENOUS | Status: DC
  Administered 2023-08-08 – 2023-08-11 (×7): 3 mL via INTRAVENOUS

## 2023-08-08 MED ORDER — LIDOCAINE HCL (PF) 1 % IJ SOLN
INTRAMUSCULAR | Status: AC
  Filled 2023-08-08: qty 30

## 2023-08-08 SURGICAL SUPPLY — 8 items
CATH SWAN GANZ 7F STRAIGHT (CATHETERS) IMPLANT
GLIDESHEATH SLENDER 7FR .021G (SHEATH) IMPLANT
PACK CARDIAC CATHETERIZATION (CUSTOM PROCEDURE TRAY) ×1 IMPLANT
SHEATH GLIDE SLENDER 4/5FR (SHEATH) IMPLANT
SHEATH PROBE COVER 6X72 (BAG) IMPLANT
TRANSDUCER W/STOPCOCK (MISCELLANEOUS) IMPLANT
TUBING ART PRESS 72 MALE/FEM (TUBING) IMPLANT
WIRE MICRO SET SILHO 5FR 7 (SHEATH) IMPLANT

## 2023-08-08 NOTE — TOC Initial Note (Signed)
 Transition of Care [facility]) - Initial/Assessment Note    Patient Details  Name: David [name] MRN: [id number] Date of Birth: 1966-02-08  [facility]) CM/SW Contact:    Ernst Heap Phone Number: [phone] 08/08/2023, 2:34 PM  Clinical Narrative:   HF CSW met with patient at bedside. Patient stated that he lives with his wife. Patient stated in the past he has used [facility] services, but nothing current. Patient stated that he does not use any equipment at home. Patient stated that he has a PCP. CSW explained that hospital follow up appointment closer towards dc. Patient agrees. Patient stated that he does not work or drive. Patient stated that his wife provides transportation to all of his appointments and will transport home at dc.    2:33 PM- HF CSW called and spoke with  patients wife  over the phone to address the SDOH needs. Patients wife stated that she has already applied for Food Stamps and sought out all of the resources in the [facility] area and does not need anything additional at this time.              Expected Discharge Plan: Home/Self Care Barriers to Discharge: Continued Medical Work up   Patient Goals and CMS Choice            Expected Discharge Plan and Services   Discharge Planning Services: CM Consult   Living arrangements for the past 2 months: Apartment                                      Prior Living Arrangements/Services Living arrangements for the past 2 months: Apartment Lives with:: Spouse Patient language and need for interpreter reviewed:: Yes Do you feel safe going back to the place where you live?: Yes      Need for Family Participation in Patient Care: No (Comment) Care giver support system in place?: No (comment) Current home services: DME (rolling walker) Criminal Activity/Legal Involvement Pertinent to Current Situation/Hospitalization: No - Comment as needed  Activities of Daily Living   ADL  Screening (condition at time of admission) Independently performs ADLs?: Yes (appropriate for developmental age) Is the patient deaf or have difficulty hearing?: No Does the patient have difficulty seeing, even when wearing glasses/contacts?: No Does the patient have difficulty concentrating, remembering, or making decisions?: No  Permission Sought/Granted Permission sought to share information with : Case Manager Permission granted to share information with : Yes, Verbal Permission Granted  Share Information with NAME: Aleksi Brummet     Permission granted to share info w Relationship: wife  Permission granted to share info w Contact Information: [phone]  Emotional Assessment Appearance:: Appears stated age Attitude/Demeanor/Rapport: Engaged Affect (typically observed): Accepting Orientation: : Oriented to Self, Oriented to Place, Oriented to  Time, Oriented to Situation   Psych Involvement: No (comment)  Admission diagnosis:  Pneumonia [J18.9] Patient Active Problem List   Diagnosis Date Noted   Pneumonia 08/03/2023   SOB (shortness of breath) 07/10/2023   Malfunction of implantable defibrillator ventricular (ICD) lead 05/24/2023   ICD (implantable cardioverter-defibrillator) lead failure 05/24/2023   Failure of implantable cardioverter-defibrillator (ICD) lead 05/24/2023   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 08/30/2022   SAH (subarachnoid hemorrhage) (HCC)    LVAD (left ventricular assist device) present (HCC)    ICH (intracerebral hemorrhage) (HCC) 06/05/2021   Left ventricular assist device present (HCC)  06/08/2017   Gastric AVM    Presence of left ventricular assist device (LVAD) (HCC) 04/20/2017   Palliative care encounter    Ischemic cardiomyopathy 11/07/2016   CKD (chronic kidney disease), stage III (HCC) 05/13/2016   Acute hypoxic respiratory failure (HCC) 05/13/2016   PAF (paroxysmal atrial fibrillation) (HCC)    Acute on chronic  systolic heart failure (HCC) 03/15/2011   PCP:  Abbe Hoard., MD Pharmacy:   [facility] [address], [place] - [address] [place] [address] Phone: [phone] Fax: [phone]  [facility] [address] [place] [address] Phone: [phone] Fax: [phone]     Social Drivers of Health (SDOH) Social History: SDOH Screenings   Food Insecurity: No Food Insecurity (08/03/2023)  Recent Concern: Food Insecurity - Food Insecurity Present (05/24/2023)  Housing: Low Risk  (08/03/2023)  Transportation Needs: No Transportation Needs (08/03/2023)  Utilities: Not At Risk (08/03/2023)  Recent Concern: Utilities - At Risk (05/24/2023)  Depression (PHQ2-9): High Risk (07/19/2021)  Financial Resource Strain: High Risk (07/04/2022)  Physical Activity: Inactive (03/10/2017)  Stress: No Stress Concern Present (03/10/2017)  Tobacco Use: Medium Risk (08/03/2023)   SDOH Interventions:     Readmission Risk Interventions     No data to display

## 2023-08-08 NOTE — Progress Notes (Signed)
 LVAD Coordinator Rounding Note:  Admitted 08/04/23 following a hospital follow up in VAD Clinic. Pt reported increased bloating and SOB since Tuesday. SPO2 79% on RA. Pt placed on 3L [place] in VAD Clinic and admitted to [facility].   HM3 LVAD implanted on 04/20/17 by Dr Sherene Dilling under destination therapy criteria.  Pt sitting up in recliner. States he is feeling much better. Denies shortness of breath currently.   Azithromycin and Rocephin stopped today. Transitioned to Augmentin.   INR 2.4 today. Coumadin remains on hold until closer to goal (1.5 - 2.0).   Speed increased to 5600 during RHC today. Pt will need CT high res to assess lungs today.  Vital signs: Temp: 97.7 HR: 89 Doppler Pressure: 86 Automatic BP: 97/86 (92) O2 Sat: 91% on RA Wt:  169.7>165>153.9>154.5>153.6 lbs    LVAD interrogation reveals Pre-cath:  Speed: 5500 Flow: 5.2 Power: 4.3 w PI: 3.2   Alarms: none Events:  1 PI on 4/13 Hematocrit: 36  Fixed speed: 5500 Low speed limit: 5200  LVAD interrogation reveals Post-cath: Speed: 5600 Flow: 5.4 Power: 4.4 w PI: 2.7  Drive Line: Existing dressing clean, dry, and intact. Anchor correctly applied. Weekly dressing maintained by pt's wife at home. Weekly dressing changes using weekly kit by wife Tarri Farm. Next dressing change due: 08/14/23.   Labs:  LDH trend: [id number]  INR trend: 3.5>3.0>2.4  Cr trend: 1.66>1.41>1.74  WBC trend: 8.5>6.9>7.6  Anticoagulation Plan: -INR Goal: 1.5-2.0 -ASA Dose: none - Coumadin: on HOLD  Device: -Medtronic BiV -Therapies: ON  Arrythmias: Hx Afib/AF- on Amiodarone 200 mg daily  Plan/Recommendations:  1. Page VAD coordinator with any drive line or equipment concerns 2. Weekly drive line dressing changes by bedside RN  Adams Adams RN VAD Coordinator  Office: [phone]  24/7 Pager: [phone]

## 2023-08-08 NOTE — Plan of Care (Signed)
  Problem: Cardiac: Goal: LVAD will function as expected and patient will experience no clinical alarms Outcome: Progressing   Problem: Education: Goal: Knowledge of General Education information will improve Description: Including pain rating scale, medication(s)/side effects and non-pharmacologic comfort measures Outcome: Progressing   Problem: Clinical Measurements: Goal: Ability to maintain clinical measurements within normal limits will improve Outcome: Progressing Goal: Cardiovascular complication will be avoided Outcome: Progressing

## 2023-08-08 NOTE — Progress Notes (Signed)
 PHARMACY - ANTICOAGULATION CONSULT NOTE  Pharmacy Consult for warfarin Indication:  LVAD  Allergies  Allergen Reactions   Plavix [Clopidogrel Bisulfate] Hives    Patient Measurements: Height: 5\' 5"  (165.1 cm) Weight: 69.7 kg (153 lb 10.6 oz) IBW/kg (Calculated) : 61.5 HEPARIN DW (KG): 74.4  Vital Signs: Temp: 98.4 F (36.9 C) (04/15 0714) Temp Source: Oral (04/15 0714) BP: 97/86 (04/15 0852) Pulse Rate: 90 (04/15 0824)  Labs: Recent Labs    08/06/23 0156 08/07/23 0211 08/08/23 0214  HGB 11.3* 10.8* 11.4*  HCT 35.3* 34.4* 36.2*  PLT 202 206 235  LABPROT 31.1* 31.2* 26.2*  INR 3.0* 3.0* 2.4*  CREATININE 1.67* 1.41* 1.74*    Estimated Creatinine Clearance: 39.8 mL/min (A) (by C-G formula based on SCr of 1.74 mg/dL (H)).   Medical History: Past Medical History:  Diagnosis Date   AICD (automatic cardioverter/defibrillator) present    Anemia    Anxiety    CAD (coronary artery disease) 2009   AMI in 12/2007 with PCI to LAD, staged PCI to  mid/distal RCA, NSTEMI in 02/2009 with BMS to LCx   CHF (congestive heart failure) (HCC)    Chronic kidney disease 11/03/2016   stage 3 kidney disease   Dysrhythmia    "arrythmia problems at some point", "fatal rhythms"   History of blood transfusion    "I was bleeding from was rectum" (04/19/2017)   HLD (hyperlipidemia)    HTN (hypertension)    Hyperlipidemia 03/15/2011   Ischemic cardiomyopathy    Admitted in 07/2010 with CHF exacerbation   Myocardial infarction (HCC)    "I've had 4" (04/19/2017)   Pneumonia 2018 X 1   Seizures (HCC)    one seizure in 04/2016 during cardiac event (04/19/2017)   STEMI 2009 (anterior), 2010 (lateral), and 2012 (inferior) 03/14/2011    Medications:  Scheduled:   amLODipine  5 mg Oral Daily   amoxicillin-clavulanate  1 tablet Oral Q12H   atorvastatin  40 mg Oral Daily   furosemide  40 mg Oral Daily   magnesium oxide  400 mg Oral BID   pantoprazole  40 mg Oral Daily   potassium  chloride  40 mEq Oral Daily   [START ON 08/09/2023] predniSONE  60 mg Oral Q breakfast   sodium chloride flush  3 mL Intravenous Q12H   tamsulosin  0.4 mg Oral Daily   traZODone  50 mg Oral QHS   Warfarin - Pharmacist Dosing Inpatient   Does not apply q1600    Assessment: 57 yom presenting with hypoxia. Hx of LVAD HM3 in 2019 - on warfarin PTA. Last dose on 4/8 - PTA regimen is 1 mg daily except 2 mg MWF.   INR remains supratherapeutic today at 2.4 (-0.6).  Goal of Therapy:  INR 1.5-2 Monitor platelets by anticoagulation protocol: Yes   Plan:  Continue to hold warfarin until INR closer to goal Monitor daily INR, CBC, and for s/sx of bleeding   Albino Alu, PharmD PGY2 Cardiology Pharmacy Resident  08/08/2023 11:30 AM   [facility] pharmacy phone numbers are listed on [email or web address]

## 2023-08-08 NOTE — Progress Notes (Signed)
 Advanced Heart Failure VAD Team Note  PCP-Cardiologist: Arvilla Meres, MD  Chief Complaint: Heart Failure/PNA Subjective:    Diuresed well with lasix but continues with O2 requirement.   RHC today with well compensated hemodynamics. RA 6 PCWP 19 CI 3.0  LVAD INTERROGATION:  HeartMate III LVAD:   Flow 5.2 liters/min, speed 5500, power 4.0 PI 3.0    Objective:    Vital Signs:   Temp:  [97.7 F (36.5 C)-98.6 F (37 C)] 98.4 F (36.9 C) (04/15 0714) Pulse Rate:  [88-93] 90 (04/15 0824) Resp:  [16-29] 25 (04/15 0824) BP: (92-114)/(60-90) 97/79 (04/15 0824) SpO2:  [81 %-98 %] 88 % (04/15 0824) Weight:  [69.7 kg] 69.7 kg (04/15 0604) Last BM Date : 08/04/23  Mean arterial Pressure 80s  Intake/Output:  Intake/Output Summary (Last 24 hours) at 08/08/2023 0841 Last data filed at 08/07/2023 2116 Gross per 24 hour  Intake 240 ml  Output 1500 ml  Net -1260 ml    Physical Exam    General:  NAD.  HEENT: normal  Neck: supple. JVP 6  Carotids 2+ bilat; no bruits. No lymphadenopathy or thryomegaly appreciated. Cor: LVAD hum.  Lungs: Coarse Abdomen:  soft, nontender, non-distended. No hepatosplenomegaly. No bruits or masses. Good bowel sounds. Driveline site clean. Anchor in place.  Extremities: no cyanosis, clubbing, rash. Warm no edema  Neuro: alert & oriented x 3. No focal deficits. Moves all 4 without problem   Telemetry   SR 90s, + PVCs Personally reviewed  Labs   Basic Metabolic Panel: Recent Labs  Lab 08/04/23 0220 08/05/23 0203 08/06/23 0156 08/07/23 0211 08/08/23 0214  NA 137 135 138 137 138  K 3.5 3.8 3.7 4.2 3.9  CL 102 100 102 105 103  CO2 24 25 25 22 26   GLUCOSE 99 109* 147* 94 92  BUN 12 16 15 13 14   CREATININE 1.66* 1.68* 1.67* 1.41* 1.74*  CALCIUM 8.3* 8.4* 8.9 8.8* 9.0  MG 1.8 2.0 1.9 2.0 1.8   Liver Function Tests: Recent Labs  Lab 08/03/23 0904  AST 22  ALT 16  ALKPHOS 102  BILITOT 1.6*  PROT 8.3*  ALBUMIN 2.8*   CBC: Recent  Labs  Lab 08/04/23 0220 08/05/23 0203 08/06/23 0156 08/07/23 0211 08/08/23 0214  WBC 8.5 9.0 7.6 6.9 7.6  HGB 11.0* 11.2* 11.3* 10.8* 11.4*  HCT 34.9* 34.9* 35.3* 34.4* 36.2*  MCV 78.8* 78.1* 79.0* 78.9* 78.4*  PLT 165 197 202 206 235   INR: Recent Labs  Lab 08/04/23 0220 08/05/23 0203 08/06/23 0156 08/07/23 0211 08/08/23 0214  INR 3.5* 3.9* 3.0* 3.0* 2.4*   Imaging   CARDIAC CATHETERIZATION Result Date: 08/08/2023 Findings: On 5500 VAD speed RA = 6 RV = 53/5 PA = 49/26 (35) PCW = 19 Fick cardiac output/index = 5.4/3.1 TD CO/CI =5.3/3.0 PVR = 3.0 Ao sat = 88% (on 2L) PA sat = 58%, 62% PAPi = 3.8 Speed increased to 5600 PCW = 19 Assessment: 1. Relatively optimized hemodynamics 2. VAD speed increased to 5600 Plan/Discussion: Suspect hypoxemia primarily respiratory in nature. Concern for amio toxicity. Stop amio. Check ESR and hi-res CT. Arvilla Meres, MD 8:38 AM   Medications:    Scheduled Medications:  amiodarone  200 mg Oral Daily   amLODipine  5 mg Oral Daily   amoxicillin-clavulanate  1 tablet Oral Q12H   atorvastatin  40 mg Oral Daily   furosemide  40 mg Oral Daily   magnesium oxide  400 mg Oral BID   pantoprazole  40 mg Oral Daily   potassium chloride  40 mEq Oral Daily   tamsulosin  0.4 mg Oral Daily   traZODone  50 mg Oral QHS   Warfarin - Pharmacist Dosing Inpatient   Does not apply q1600   Infusions:   PRN Medications: acetaminophen, ondansetron (ZOFRAN) IV  Patient Profile   David [name] is a 58 y.o. male with history of ICD (09 PCI to LAD, mRCA, '10 PCI to Lcx and '12 mRCA), HFrEF (EF ~20% for > 5 years), HLD and HTN. S/P LVAD HM-III 04/20/18.   Admitted with acute hypoxic respiratory failure, possible PNA.   Assessment/Plan:    1. Acute Hypoxic Respiratory Failure: admitted earlier in the month with Flu A. ?PNA post viral infection. Also volume overloaded on exam on admit. CXR with pulm congestion. On [place].  - Azithro/rochepin switched to  Augmentin on 4/14 - Remains hypoxic despite adequate diuresis (see RHC) - Concern for amio toxicity. - Stop amio. Check ESR/hi-res CT - Start steroids   2 . Chronic systolic CHF with HM III LVAD implant 04/20/17 - Volume improving - RHC as above - give po lasix today   3. VAD management - VAD interrogated personally. Parameters stable. (Ramp RHC done today) - INR 2.4 Goal 2.0-2.5. Discussed warfarin dosing with PharmD personally. - LDH & hgb stable - DL site ok   4. CKD3: basleine 1.5-1.8 - sCr 1.4 -> 1.7 today   5. Hx of Ventricular tachycardia - Had ICD shock x 2 for VT on 11/24 (device reprogrammed at the time) - S/p fracture lead removal and ICD exchange 1/25 -Amio stopped today   6. CAD:  s/p PCI RCA/PLOM with DES x2 and DES to mild LAD 1/18 - No s/s angina - Continue statin. Off ASA due to GIB   7. Urinary retention: reports intermittent dysuria in the past week - Continue Flomax 0.4 mg daily   8. HTN: Stable - continue norvasc 5 mg   9. Microcytic anemia - check iron stores  I reviewed the LVAD parameters from today, and compared the results to the patient's prior recorded data.  No programming changes were made.  The LVAD is functioning within specified parameters.  The patient performs LVAD self-test daily.  LVAD interrogation was negative for any significant power changes, alarms or PI events/speed drops.  LVAD equipment check completed and is in good working order.  Back-up equipment present.   LVAD education done on emergency procedures and precautions and reviewed exit site care.  Length of Stay: 5  Jules Oar, MD 08/08/2023, 8:41 AM  VAD Team --- VAD ISSUES ONLY--- Pager [phone] (7am - 7am)  Advanced Heart Failure Team  Pager [phone] (M-F; 7a - 5p)  Please contact [facility] for night-coverage after hours (5p -7a ) and weekends on [email or web address]

## 2023-08-08 NOTE — Interval H&P Note (Signed)
 History and Physical Interval Note:  08/08/2023 7:50 AM  [name]  has presented today for surgery, with the diagnosis of heart failure.  The various methods of treatment have been discussed with the patient and family. After consideration of risks, benefits and other options for treatment, the patient has consented to  Procedure(s): RIGHT HEART CATH (N/A) as a surgical intervention.  The patient's history has been reviewed, patient examined, no change in status, stable for surgery.  I have reviewed the patient's chart and labs.  Questions were answered to the patient's satisfaction.     David [name]

## 2023-08-09 DIAGNOSIS — R918 Other nonspecific abnormal finding of lung field: Secondary | ICD-10-CM | POA: Diagnosis not present

## 2023-08-09 DIAGNOSIS — I5023 Acute on chronic systolic (congestive) heart failure: Secondary | ICD-10-CM | POA: Diagnosis not present

## 2023-08-09 DIAGNOSIS — J9601 Acute respiratory failure with hypoxia: Secondary | ICD-10-CM | POA: Diagnosis not present

## 2023-08-09 DIAGNOSIS — Z95811 Presence of heart assist device: Secondary | ICD-10-CM | POA: Diagnosis not present

## 2023-08-09 LAB — LACTATE DEHYDROGENASE: LDH: 200 U/L — ABNORMAL HIGH (ref 98–192)

## 2023-08-09 LAB — RETICULOCYTES
Immature Retic Fract: 21.1 % — ABNORMAL HIGH (ref 2.3–15.9)
RBC.: 5.2 MIL/uL (ref 4.22–5.81)
Retic Count, Absolute: 80.1 10*3/uL (ref 19.0–186.0)
Retic Ct Pct: 1.5 % (ref 0.4–3.1)

## 2023-08-09 LAB — PROTIME-INR
INR: 1.9 — ABNORMAL HIGH (ref 0.8–1.2)
Prothrombin Time: 21.6 s — ABNORMAL HIGH (ref 11.4–15.2)

## 2023-08-09 LAB — MAGNESIUM: Magnesium: 1.8 mg/dL (ref 1.7–2.4)

## 2023-08-09 LAB — BASIC METABOLIC PANEL WITH GFR
Anion gap: 8 (ref 5–15)
BUN: 14 mg/dL (ref 6–20)
CO2: 25 mmol/L (ref 22–32)
Calcium: 8.8 mg/dL — ABNORMAL LOW (ref 8.9–10.3)
Chloride: 104 mmol/L (ref 98–111)
Creatinine, Ser: 1.62 mg/dL — ABNORMAL HIGH (ref 0.61–1.24)
GFR, Estimated: 49 mL/min — ABNORMAL LOW (ref >60)
Glucose, Bld: 89 mg/dL (ref 70–99)
Potassium: 4.6 mmol/L (ref 3.5–5.1)
Sodium: 137 mmol/L (ref 135–145)

## 2023-08-09 LAB — VITAMIN B12: Vitamin B-12: 517 pg/mL (ref 180–914)

## 2023-08-09 LAB — FOLATE: Folate: 16 ng/mL (ref >5.9)

## 2023-08-09 LAB — IRON AND TIBC
Iron: 45 ug/dL (ref 45–182)
Saturation Ratios: 18 % (ref 17.9–39.5)
TIBC: 249 ug/dL — ABNORMAL LOW (ref 250–450)
UIBC: 204 ug/dL

## 2023-08-09 LAB — FERRITIN: Ferritin: 145 ng/mL (ref 24–336)

## 2023-08-09 MED ORDER — MAGNESIUM SULFATE 2 GM/50ML IV SOLN
2.0000 g | Freq: Once | INTRAVENOUS | Status: AC
  Administered 2023-08-09: 2 g via INTRAVENOUS
  Filled 2023-08-09: qty 50

## 2023-08-09 MED ORDER — SENNOSIDES-DOCUSATE SODIUM 8.6-50 MG PO TABS
1.0000 | ORAL_TABLET | Freq: Every day | ORAL | Status: DC
  Administered 2023-08-09 – 2023-08-10 (×2): 1 via ORAL
  Filled 2023-08-09 (×2): qty 1

## 2023-08-09 MED ORDER — POLYETHYLENE GLYCOL 3350 17 G PO PACK
17.0000 g | PACK | Freq: Every day | ORAL | Status: DC
  Filled 2023-08-09 (×3): qty 1

## 2023-08-09 MED ORDER — WARFARIN SODIUM 1 MG PO TABS
1.0000 mg | ORAL_TABLET | Freq: Once | ORAL | Status: AC
  Administered 2023-08-09: 1 mg via ORAL
  Filled 2023-08-09: qty 1

## 2023-08-09 NOTE — Progress Notes (Signed)
 LVAD Coordinator Rounding Note:  Admitted 08/04/23 following a hospital follow up in [facility]. Pt reported increased bloating and SOB since Tuesday. SPO2 79% on RA. Pt placed on 3L [place] in [facility] and admitted to [facility].   HM3 LVAD implanted on 04/20/17 by Dr Sherene Dilling under destination therapy criteria.  Pt sitting up on the side of the bed eating breakfast. States he is feeling much better. Denies shortness of breath currently. But pt is requiring 3L/[place] of oxygen.   Transitioned to Augmentin.   INR 1.9 today.  Speed increased to 5600 during RHC yesterday. Pt had CT high res to assess lungs yesterday but it has not been read.  Vital signs: Temp: 97.7 HR: 90 Doppler Pressure: 88 Automatic BP: 93/82 (88) O2 Sat: 93% on 3L/[place] Wt: 169.7>165>153.9>154.5>153.6 lbs    LVAD interrogation :  Speed: 5600 Flow: 5.3 Power: 4.3 w PI: 3.1   Alarms: none Events:  none Hematocrit: 36  Fixed speed: 5600 Low speed limit: 5300  Drive Line: Existing dressing clean, dry, and intact. Anchor correctly applied. Weekly dressing maintained by pt's wife at home. Weekly dressing changes using weekly kit by wife Tarri Farm. Next dressing change due: 08/14/23.   Labs:  LDH trend: 169>179>169>200  INR trend: 3.5>3.0>2.4>1.9  Cr trend: 1.66>1.41>1.74>1.62  WBC trend: 8.5>6.9>7.6  Anticoagulation Plan: -INR Goal: 1.5-2.0 -ASA Dose: none - Coumadin: on HOLD  Device: -Medtronic BiV -Therapies: ON  Arrythmias: Hx Afib/AF- on Amiodarone 200 mg daily- stopped yesterday due to concern for Amio lung toxicity  Plan/Recommendations:  1. Page VAD coordinator with any drive line or equipment concerns 2. Weekly drive line dressing changes by bedside RN  Adams Adams RN VAD Coordinator  Office: [phone]  24/7 Pager: [phone]

## 2023-08-09 NOTE — Progress Notes (Signed)
 PHARMACY - ANTICOAGULATION CONSULT NOTE  Pharmacy Consult for warfarin Indication:  LVAD  Allergies  Allergen Reactions   Plavix [Clopidogrel Bisulfate] Hives    Patient Measurements: Height: 5\' 5"  (165.1 cm) Weight: 69.7 kg (153 lb 10.6 oz) IBW/kg (Calculated) : 61.5 HEPARIN DW (KG): 74.4  Vital Signs: Temp: 98 F (36.7 C) (04/16 0518) Temp Source: Oral (04/16 0518) BP: 93/74 (04/16 0518) Pulse Rate: 86 (04/15 2007)  Labs: Recent Labs    08/07/23 0211 08/08/23 0214 08/08/23 0818 08/09/23 0223  HGB 10.8* 11.4* 12.9*  12.9*  --   HCT 34.4* 36.2* 38.0*  38.0*  --   PLT 206 235  --   --   LABPROT 31.2* 26.2*  --  21.6*  INR 3.0* 2.4*  --  1.9*  CREATININE 1.41* 1.74*  --   --     Estimated Creatinine Clearance: 39.8 mL/min (A) (by C-G formula based on SCr of 1.74 mg/dL (H)).   Medical History: Past Medical History:  Diagnosis Date   AICD (automatic cardioverter/defibrillator) present    Anemia    Anxiety    CAD (coronary artery disease) 2009   AMI in 12/2007 with PCI to LAD, staged PCI to  mid/distal RCA, NSTEMI in 02/2009 with BMS to LCx   CHF (congestive heart failure) (HCC)    Chronic kidney disease 11/03/2016   stage 3 kidney disease   Dysrhythmia    "arrythmia problems at some point", "fatal rhythms"   History of blood transfusion    "I was bleeding from was rectum" (04/19/2017)   HLD (hyperlipidemia)    HTN (hypertension)    Hyperlipidemia 03/15/2011   Ischemic cardiomyopathy    Admitted in 07/2010 with CHF exacerbation   Myocardial infarction (HCC)    "I've had 4" (04/19/2017)   Pneumonia 2018 X 1   Seizures (HCC)    one seizure in 04/2016 during cardiac event (04/19/2017)   STEMI 2009 (anterior), 2010 (lateral), and 2012 (inferior) 03/14/2011    Medications:  Scheduled:   amLODipine  5 mg Oral Daily   amoxicillin-clavulanate  1 tablet Oral Q12H   atorvastatin  40 mg Oral Daily   furosemide  40 mg Oral Daily   magnesium oxide  400 mg Oral  BID   pantoprazole  40 mg Oral Daily   potassium chloride  40 mEq Oral Daily   predniSONE  60 mg Oral Q breakfast   sodium chloride flush  3 mL Intravenous Q12H   tamsulosin  0.4 mg Oral Daily   traZODone  50 mg Oral QHS   Warfarin - Pharmacist Dosing Inpatient   Does not apply q1600    Assessment: 57 yom presenting with hypoxia. Hx of LVAD HM3 in 2019 - on warfarin PTA. Last dose on 4/8 - PTA regimen is 1 mg daily except 2 mg MWF.   INR now in goal range today at 1.9 (-0.5). CBC is stable with no signs of bleeding. LDH is up but stable.  Goal of Therapy:  INR 1.5-2 Monitor platelets by anticoagulation protocol: Yes   Plan:  Give warfarin 1 mg today Monitor daily INR, CBC, and for s/sx of bleeding   Wilmer Floor, PharmD PGY2 Cardiology Pharmacy Resident  08/09/2023 6:03 AM   [facility] pharmacy phone numbers are listed on [email or web address]

## 2023-08-09 NOTE — Plan of Care (Signed)
  Problem: Cardiac: Goal: LVAD will function as expected and patient will experience no clinical alarms Outcome: Progressing   Problem: Education: Goal: Knowledge of General Education information will improve Description: Including pain rating scale, medication(s)/side effects and non-pharmacologic comfort measures Outcome: Progressing   Problem: Clinical Measurements: Goal: Diagnostic test results will improve Outcome: Progressing Goal: Respiratory complications will improve Outcome: Progressing

## 2023-08-09 NOTE — Consult Note (Addendum)
 NAME:  David [name], MRN:  [id number], DOB:  1966/01/22, LOS: 6 ADMISSION DATE:  08/03/2023, CONSULTATION DATE:  08/09/23 REFERRING MD:  AHF, CHIEF COMPLAINT:  Bloating   History of Present Illness:  58 year old man w/ LVAD (03/2017 HM3) presenting with increasing bloating and DOE.  Recent admit last month for influenza pneumonia.  Diuresed but persistent O2 need and abnormal chest imaging so PCCM is consulted.  He has about a 30ish pack year smoking history.  Has MMRC 1 dyspnea even before influenza admit.  No inhalers. Wife states he sometimes wheezes.  He a very mild dry cough improved over stay.  Mother had emphysema. He has had no prior lung problems other than a pneumonia 7 years ago.  Denies aspiration symptoms.  Pertinent  Medical History  Ischemic cardiomyopathy s/p HM3, AICD CKD HTN, HLD  PFTs 2018 very poor participation; at least mild obstruction and possible concurrent restriction, reduced DLCO with only partial [employer] compensation  Significant Hospital Events: Including procedures, antibiotic start and stop dates in addition to other pertinent events   4/10 admit  Interim History / Subjective:  Consult  Objective   Blood pressure 93/82, pulse 80, temperature 97.7 F (36.5 C), temperature source Oral, resp. rate 16, height 5\' 5"  (1.651 m), weight 69.7 kg, SpO2 93%.        Intake/Output Summary (Last 24 hours) at 08/09/2023 1053 Last data filed at 08/09/2023 4098 Gross per 24 hour  Intake 770 ml  Output 550 ml  Net 220 ml   Filed Weights   08/07/23 0628 08/08/23 0604 08/09/23 0518  Weight: 70.1 kg 69.7 kg 69.7 kg    Examination: General: no distress HENT: MMM, trachea midline Lungs: nonlabored breathing pattern, sats intermittent read 95% 4LPM poor waveform; R side dry crackles, L more clear Cardiovascular: LVAD hum, ext warm Abdomen: soft, hypoactive BS Extremities: no edema, +mild clubbing Neuro: moves to command, RASS 0 Skin: no rashes  CT, labs, PFTs  personally reviewed VBG normal acid-base status  08/08/23 RHC RA = 6 RV = 53/5 PA = 49/26 (35) PCW = 19 Fick cardiac output/index = 5.4/3.1 TD CO/CI =5.3/3.0 PVR = 3.0 Ao sat = 88% (on 2L) PA sat = 58%, 62% PAPi = 3.8  Resolved Hospital Problem list   N/A  Assessment & Plan:  Hypoxemia, abnormal chest CT:  Rounded atelectasis L base Moderate apical emphysematous changes R sided infiltrates, look different in RUL due to emphysema  Previous CXRs reviewed: I think he has had abnormal lung architecture for quite a while.  He has never really had a normal A-a gradient.  So essentially this is some volume overload plus R sided infiltrate/CAP/aspiration with improved symptoms but lingering abnormalities on imaging many of which are chronic.  Normal sed rate and imaging pattern not c/w amio lung toxicity.  Would not add steroids unless lingering GGO or symptoms.  Overall would give imaging about 4-6 weeks to regress then repeat CT scan of chest to see what is chronic and what was acute.  If fair bit of chronic changes we can set him up in pulmonary clinic to follow PFTs and consider cardiopulmonary rehab depending on symptoms.  May need home O2 at least short term at bedtime and with activity to improve QoL.  Again his lack of symptoms make me wonder about the chronicity of this.  Agree with long term PPI empirically.  Could consider trial of PRN LABA/LAMA to see if it improves exercise tolerance.   Best  Practice (right click and "Reselect all SmartList Selections" daily)   Per primary  Labs   CBC: Recent Labs  Lab 08/04/23 0220 08/05/23 0203 08/06/23 0156 08/07/23 0211 08/08/23 0214 08/08/23 0818  WBC 8.5 9.0 7.6 6.9 7.6  --   HGB 11.0* 11.2* 11.3* 10.8* 11.4* 12.9*  12.9*  HCT 34.9* 34.9* 35.3* 34.4* 36.2* 38.0*  38.0*  MCV 78.8* 78.1* 79.0* 78.9* 78.4*  --   PLT 165 197 202 206 235  --     Basic Metabolic Panel: Recent Labs  Lab 08/05/23 0203 08/06/23 0156  08/07/23 0211 08/08/23 0214 08/08/23 0818 08/09/23 0221 08/09/23 0223  NA 135 138 137 138 141  140 137  --   K 3.8 3.7 4.2 3.9 4.0  4.2 4.6  --   CL 100 102 105 103  --  104  --   CO2 25 25 22 26   --  25  --   GLUCOSE 109* 147* 94 92  --  89  --   BUN 16 15 13 14   --  14  --   CREATININE 1.68* 1.67* 1.41* 1.74*  --  1.62*  --   CALCIUM 8.4* 8.9 8.8* 9.0  --  8.8*  --   MG 2.0 1.9 2.0 1.8  --   --  1.8   GFR: Estimated Creatinine Clearance: 42.7 mL/min (A) (by C-G formula based on SCr of 1.62 mg/dL (H)). Recent Labs  Lab 08/05/23 0203 08/06/23 0156 08/07/23 0211 08/08/23 0214  WBC 9.0 7.6 6.9 7.6    Liver Function Tests: Recent Labs  Lab 08/03/23 0904  AST 22  ALT 16  ALKPHOS 102  BILITOT 1.6*  PROT 8.3*  ALBUMIN 2.8*   No results for input(s): "LIPASE", "AMYLASE" in the last 168 hours. No results for input(s): "AMMONIA" in the last 168 hours.  ABG    Component Value Date/Time   PHART 7.477 (H) 04/27/2017 0351   PCO2ART 31.7 (L) 04/27/2017 0351   PO2ART 57.0 (L) 04/27/2017 0351   HCO3 27.6 08/08/2023 0818   HCO3 25.7 08/08/2023 0818   TCO2 29 08/08/2023 0818   TCO2 27 08/08/2023 0818   ACIDBASEDEF 1.0 04/24/2017 0926   O2SAT 62 08/08/2023 0818   O2SAT 58 08/08/2023 0818     Coagulation Profile: Recent Labs  Lab 08/05/23 0203 08/06/23 0156 08/07/23 0211 08/08/23 0214 08/09/23 0223  INR 3.9* 3.0* 3.0* 2.4* 1.9*    Cardiac Enzymes: No results for input(s): "CKTOTAL", "CKMB", "CKMBINDEX", "TROPONINI" in the last 168 hours.  HbA1C: Hgb A1c MFr Bld  Date/Time Value Ref Range Status  04/19/2017 07:28 PM 6.1 (H) 4.8 - 5.6 % Final    Comment:    (NOTE) Pre diabetes:          5.7%-6.4% Diabetes:              >6.4% Glycemic control for   <7.0% adults with diabetes   03/13/2017 11:30 AM 6.1 (H) 4.8 - 5.6 % Final    Comment:    (NOTE) Pre diabetes:          5.7%-6.4% Diabetes:              >6.4% Glycemic control for   <7.0% adults with  diabetes     CBG: No results for input(s): "GLUCAP" in the last 168 hours.  Review of Systems:    Positive Symptoms in bold:  Constitutional fevers, chills, weight loss, fatigue, anorexia, malaise  Eyes decreased vision, double vision, eye irritation  Ears, Nose, Mouth, Throat sore throat, trouble swallowing, sinus congestion  Cardiovascular chest pain, paroxysmal nocturnal dyspnea, lower ext edema, palpitations   Respiratory SOB, cough, DOE, hemoptysis, wheezing  Gastrointestinal nausea, vomiting, diarrhea  Genitourinary burning with urination, trouble urinating  Musculoskeletal joint aches, joint swelling, back pain  Integumentary  rashes, skin lesions  Neurological focal weakness, focal numbness, trouble speaking, headaches  Psychiatric depression, anxiety, confusion  Endocrine polyuria, polydipsia, cold intolerance, heat intolerance  Hematologic abnormal bruising, abnormal bleeding, unexplained nose bleeds  Allergic/Immunologic recurrent infections, hives, swollen lymph nodes     Past Medical History:  He,  has a past medical history of AICD (automatic cardioverter/defibrillator) present, Anemia, Anxiety, CAD (coronary artery disease) (2009), CHF (congestive heart failure) (HCC), Chronic kidney disease (11/03/2016), Dysrhythmia, History of blood transfusion, HLD (hyperlipidemia), HTN (hypertension), Hyperlipidemia (03/15/2011), Ischemic cardiomyopathy, Myocardial infarction [facility]), Pneumonia (2018 X 1), Seizures (HCC), and STEMI 2009 (anterior), 2010 (lateral), and 2012 (inferior) (03/14/2011).   Surgical History:   Past Surgical History:  Procedure Laterality Date   AORTIC VALVE REPAIR N/A 04/20/2017   Procedure: AORTIC VALVE REPAIR;  Surgeon: Alleen Borne, MD;  Location: [facility] OR;  Service: Open Heart Surgery;  Laterality: N/A;   BIV ICD INSERTION CRT-D N/A 08/01/2016   Procedure: BiV ICD Insertion CRT-D;  Surgeon: Will Jorja Loa, MD;  Location: [facility] INVASIVE CV LAB;   Service: Cardiovascular;  Laterality: N/A;   CARDIAC CATHETERIZATION N/A 05/05/2016   Procedure: Right/Left Heart Cath and Coronary Angiography;  Surgeon: Dolores Patty, MD;  Location: [facility] INVASIVE CV LAB;  Service: Cardiovascular;  Laterality: N/A;   CARDIAC CATHETERIZATION N/A 05/09/2016   Procedure: Coronary Stent Intervention;  Surgeon: Peter M Swaziland, MD;  Location: [facility] INVASIVE CV LAB;  Service: Cardiovascular;  Laterality: N/A;   CARDIAC CATHETERIZATION N/A 05/09/2016   Procedure: Intravascular Pressure Wire/FFR Study;  Surgeon: Peter M Swaziland, MD;  Location: [facility] INVASIVE CV LAB;  Service: Cardiovascular;  Laterality: N/A;   CARDIOVERSION N/A 10/17/2018   Procedure: CARDIOVERSION;  Surgeon: Dolores Patty, MD;  Location: [facility] ENDOSCOPY;  Service: Cardiovascular;  Laterality: N/A;   CORONARY ANGIOPLASTY WITH STENT PLACEMENT     "i've got a total of 12 stents" (04/19/2017)   ELECTROPHYSIOLOGY STUDY N/A 06/24/2016   Procedure: Electrophysiology Study;  Surgeon: Duke Salvia, MD;  Location: [facility] INVASIVE CV LAB;  Service: Cardiovascular;  Laterality: N/A;   ENTEROSCOPY N/A 06/03/2017   Procedure: ENTEROSCOPY;  Surgeon: Benancio Deeds, MD;  Location: [facility] ENDOSCOPY;  Service: Gastroenterology;  Laterality: N/A;   EPICARDIAL PACING LEAD PLACEMENT Left 11/07/2016   Procedure: LV EPICARDIAL PACING LEAD PLACEMENT VIA LEFT MINI THORACOTOMY;  Surgeon: Alleen Borne, MD;  Location: [facility] OR;  Service: Thoracic;  Laterality: Left;   ICD IMPLANT N/A 06/24/2016   Procedure: POSSIBLE  ICD Implant;  Surgeon: Duke Salvia, MD;  Location: [facility] INVASIVE CV LAB;  Service: Cardiovascular;  Laterality: N/A;   INSERTION OF IMPLANTABLE LEFT VENTRICULAR ASSIST DEVICE N/A 04/20/2017   Procedure: INSERTION OF IMPLANTABLE LEFT VENTRICULAR ASSIST DEVICE, Aortic Valve repair;  Surgeon: Alleen Borne, MD;  Location: [facility] OR;  Service: Open Heart Surgery;  Laterality: N/A;  Heartmate 3   IR FLUORO GUIDE CV LINE  RIGHT  05/08/2017   IR FLUORO GUIDE CV MIDLINE PICC RIGHT  03/14/2017   IR REMOVAL TUN CV CATH W/O FL  05/25/2017   IR US GUIDE VASC ACCESS RIGHT  03/14/2017   IR US GUIDE VASC ACCESS RIGHT  05/08/2017   LEAD  EXTRACTION N/A 05/24/2023   Procedure: LEAD EXTRACTION;  Surgeon: Boyce Byes, MD;  Location: [facility] INVASIVE CV LAB;  Service: Cardiovascular;  Laterality: N/A;   LEFT HEART CATHETERIZATION WITH CORONARY ANGIOGRAM N/A 03/13/2011   Procedure: LEFT HEART CATHETERIZATION WITH CORONARY ANGIOGRAM;  Surgeon: Avanell Leigh, MD;  Location: [facility] CATH LAB;  Service: Cardiovascular;  Laterality: N/A;   MULTIPLE EXTRACTIONS WITH ALVEOLOPLASTY N/A 04/12/2017   Procedure: Extraction of tooth #'s 2, 5-12, 17, 20-29 with alveoloplasty amd maxillary right buccal exostoses reductions.;  Surgeon: Carol Chroman, DDS;  Location: [facility];  Service: Oral Surgery;  Laterality: N/A;   RIGHT HEART CATH N/A 04/19/2017   Procedure: RIGHT HEART CATH;  Surgeon: Mardell Shade, MD;  [facility] INVASIVE CV LAB;  Service: Cardiovascular;  Laterality: N/A;   RIGHT HEART CATH N/A 08/08/2023   Procedure: RIGHT HEART CATH;  Surgeon: Mardell Shade, MD;  [facility] INVASIVE CV LAB;  Service: Cardiovascular;  Laterality: N/A;   RIGHT/LEFT HEART CATH AND CORONARY ANGIOGRAPHY N/A 03/10/2017   Procedure: RIGHT/LEFT HEART CATH AND CORONARY ANGIOGRAPHY;  Surgeon: Mardell Shade, MD;  [facility]: [facility] INVASIVE CV LAB;  Service: Cardiovascular;  Laterality: N/A;   TEE WITHOUT CARDIOVERSION N/A 04/20/2017   Procedure: TRANSESOPHAGEAL ECHOCARDIOGRAM (TEE);  Surgeon: Bartley Lightning, MD;  Location: [facility] OR;  Service: Open Heart Surgery;  Laterality: N/A;   TEE WITHOUT CARDIOVERSION N/A 10/17/2018   Procedure: TRANSESOPHAGEAL ECHOCARDIOGRAM (TEE);  Surgeon: Mardell Shade, MD;  Location: [facility] ENDOSCOPY;  Service: Cardiovascular;  Laterality: N/A;   TRANSESOPHAGEAL ECHOCARDIOGRAM (CATH LAB) N/A 05/24/2023    Procedure: TRANSESOPHAGEAL ECHOCARDIOGRAM;  Surgeon: Boyce Byes, MD;  Location: [facility] INVASIVE CV LAB;  Service: Cardiovascular;  Laterality: N/A;     Social History:   reports that he quit smoking about 7 years ago. His smoking use included cigarettes and cigars. He started smoking about 50 years ago. He has a 21.5 pack-year smoking history. He has never used smokeless tobacco. He reports that he does not drink alcohol and does not use drugs.   Family History:  His family history includes Coronary artery disease in his brother, father, mother, and another family member; Diabetes in an other family member; Emphysema in his brother and mother; Hypertension in his daughter, father, mother, sister, and another family member; Obesity in his daughter.   Allergies Allergies  Allergen Reactions   Plavix [Clopidogrel Bisulfate] Hives     Home Medications  Prior to Admission medications   Medication Sig Start Date End Date Taking? Authorizing Provider  acetaminophen (TYLENOL) 500 MG tablet Take 1,000 mg by mouth 2 (two) times daily as needed for moderate pain (pain score 4-6), fever or headache.   Yes [provider]  amiodarone (PACERONE) 200 MG tablet Take 1 tablet (200 mg total) by mouth daily. 04/14/23  Yes Bensimhon, Jerrod Damiano R, MD  amLODipine (NORVASC) 5 MG tablet Take 1 tablet (5 mg total) by mouth daily. 04/14/23  Yes Bensimhon, Rheta Celestine, MD  atorvastatin (LIPITOR) 40 MG tablet Take 1 tablet (40 mg total) by mouth daily. 04/14/23  Yes Bensimhon, Renatha Rosen R, MD  fluticasone (FLONASE) 50 MCG/ACT nasal spray Place 2 sprays into both nostrils daily. Patient taking differently: Place 2 sprays into both nostrils daily as needed for allergies or rhinitis. 04/14/23  Yes Bensimhon, Rheta Celestine, MD  furosemide (LASIX) 20 MG tablet Take 20 mg by mouth daily as needed (swelling).   Yes [provider]  magnesium oxide (MAG-OX) 400 (240 Mg)  MG tablet Take 1 tablet (400 mg total) by mouth 2  (two) times daily. 04/14/23  Yes Bensimhon, Rheta Celestine, MD  Multiple Vitamins-Minerals (MENS 50+ MULTIVITAMIN) TABS Take 1 tablet by mouth daily.   Yes [provider]  pantoprazole (PROTONIX) 40 MG tablet Take 1 tablet (40 mg total) by mouth daily. 04/14/23  Yes Bensimhon, Rheta Celestine, MD  tamsulosin (FLOMAX) 0.4 MG CAPS capsule Take 1 capsule (0.4 mg total) by mouth daily. 04/14/23  Yes Bensimhon, Rheta Celestine, MD  traZODone (DESYREL) 50 MG tablet Take 1 tablet (50 mg total) by mouth at bedtime. 04/14/23  Yes Bensimhon, Antwon Rochin R, MD  warfarin (COUMADIN) 2 MG tablet TAKE 1 MG 03/21 THEN 2 MG (1 TABLET) EVERY MON, WED, AND FRIDAY, AND 1 MG (1/2 TABLET) ALL OTHER DAYS OR AS DIRECTED BY HF CLINIC Patient taking differently: Take 1-2 mg by mouth See admin instructions. Take 1 tablet (2mg ) by mouth every Monday, Wednesday, Friday around noon/midday. Take 1/2 tablet (1mg ) around noon/midday on all other days (Sunday, Tuesday, Thursday, Saturday). 07/14/23  Yes Arleene Belt, PA-C

## 2023-08-09 NOTE — Progress Notes (Addendum)
 Advanced Heart Failure VAD Team Note  PCP-Cardiologist: Arvilla Meres, MD   Chief Complaint: Heart Failure/PNA  Subjective:    O2 sats 97% on 3L [place].  Hi Res Chest CT pending.  Feeling well. No dyspnea. Has been up ambulating around the room.    RHC 04/15: well compensated hemodynamics. RA 6 PCWP 19 CI 3.0. Speed increased to 5600.   LVAD INTERROGATION:  HeartMate III LVAD:   Flow 5.4 liters/min, speed 5650, power 4.3 PI 3.0. 1 PI event.   Objective:    Vital Signs:   Temp:  [97.7 F (36.5 C)-98.7 F (37.1 C)] 97.7 F (36.5 C) (04/16 0822) Pulse Rate:  [80-93] 80 (04/16 0822) Resp:  [16-22] 16 (04/16 0822) BP: (92-104)/(74-88) 93/82 (04/16 0822) SpO2:  [93 %-97 %] 93 % (04/16 0822) Weight:  [69.7 kg] 69.7 kg (04/16 0518) Last BM Date : 08/07/23  Mean arterial Pressure 80s  Intake/Output:  Intake/Output Summary (Last 24 hours) at 08/09/2023 0931 Last data filed at 08/09/2023 1610 Gross per 24 hour  Intake 770 ml  Output 650 ml  Net 120 ml    Physical Exam    Physical Exam: GENERAL: Lying comfortably in bed. NECK: JVP ~ 7 cm CARDIAC:  Mechanical heart sounds with LVAD hum present.  LUNGS:  Clear to auscultation bilaterally. Comfortable on 3L [place] ABDOMEN:  Soft, round, nontender, positive bowel sounds x4.     LVAD exit site:   Dressing dry and intact.  Stabilization device present and accurately applied.   EXTREMITIES:  No edema  NEUROLOGIC:  Alert and oriented x 4. Affect pleasant.      Telemetry   SR 80s, occasional PVCs  Labs   Basic Metabolic Panel: Recent Labs  Lab 08/05/23 0203 08/06/23 0156 08/07/23 0211 08/08/23 0214 08/08/23 0818 08/09/23 0221 08/09/23 0223  NA 135 138 137 138 141  140 137  --   K 3.8 3.7 4.2 3.9 4.0  4.2 4.6  --   CL 100 102 105 103  --  104  --   CO2 25 25 22 26   --  25  --   GLUCOSE 109* 147* 94 92  --  89  --   BUN 16 15 13 14   --  14  --   CREATININE 1.68* 1.67* 1.41* 1.74*  --  1.62*  --   CALCIUM 8.4*  8.9 8.8* 9.0  --  8.8*  --   MG 2.0 1.9 2.0 1.8  --   --  1.8   Liver Function Tests: Recent Labs  Lab 08/03/23 0904  AST 22  ALT 16  ALKPHOS 102  BILITOT 1.6*  PROT 8.3*  ALBUMIN 2.8*   CBC: Recent Labs  Lab 08/04/23 0220 08/05/23 0203 08/06/23 0156 08/07/23 0211 08/08/23 0214 08/08/23 0818  WBC 8.5 9.0 7.6 6.9 7.6  --   HGB 11.0* 11.2* 11.3* 10.8* 11.4* 12.9*  12.9*  HCT 34.9* 34.9* 35.3* 34.4* 36.2* 38.0*  38.0*  MCV 78.8* 78.1* 79.0* 78.9* 78.4*  --   PLT 165 197 202 206 235  --    INR: Recent Labs  Lab 08/05/23 0203 08/06/23 0156 08/07/23 0211 08/08/23 0214 08/09/23 0223  INR 3.9* 3.0* 3.0* 2.4* 1.9*   Imaging   CARDIAC CATHETERIZATION Result Date: 08/08/2023 Findings: On 5500 VAD speed RA = 6 RV = 53/5 PA = 49/26 (35) PCW = 19 Fick cardiac output/index = 5.4/3.1 TD CO/CI =5.3/3.0 PVR = 3.0 Ao sat = 88% (on 2L) PA sat = 58%,  62% PAPi = 3.8 Speed increased to 5600 PCW = 19 Assessment: 1. Relatively optimized hemodynamics 2. VAD speed increased to 5600 Plan/Discussion: Suspect hypoxemia primarily respiratory in nature. Concern for amio toxicity. Stop amio. Check ESR and hi-res CT. Bing Duffey, MD 8:38 AM   Medications:    Scheduled Medications:  amLODipine  5 mg Oral Daily   amoxicillin-clavulanate  1 tablet Oral Q12H   atorvastatin  40 mg Oral Daily   furosemide  40 mg Oral Daily   magnesium oxide  400 mg Oral BID   pantoprazole  40 mg Oral Daily   polyethylene glycol  17 g Oral Daily   potassium chloride  40 mEq Oral Daily   predniSONE  60 mg Oral Q breakfast   senna-docusate  1 tablet Oral QHS   sodium chloride flush  3 mL Intravenous Q12H   tamsulosin  0.4 mg Oral Daily   traZODone  50 mg Oral QHS   warfarin  1 mg Oral ONCE-1600   Warfarin - Pharmacist Dosing Inpatient   Does not apply q1600   Infusions:   PRN Medications: acetaminophen, acetaminophen, ondansetron (ZOFRAN) IV, sodium chloride flush  Patient Profile   SHEEHAN STACEY is a 58 y.o. male with history of ICD (09 PCI to LAD, mRCA, '10 PCI to Lcx and '12 mRCA), HFrEF (EF ~20% for > 5 years), HLD and HTN. S/P LVAD HM-III 04/20/18.   Admitted with acute hypoxic respiratory failure, possible PNA.   Assessment/Plan:    1. Acute Hypoxic Respiratory Failure: admitted earlier in the month with Flu A. ?PNA post viral infection. Also volume overloaded on exam on admit. CXR with pulm congestion. On [place].  - Azithro/rochepin switched to Augmentin on 4/14 - Has been hypoxic despite adequate diuresis (see RHC). O2 sats upper 90s on 3L, wean O2 today. Needs to ambulate. - Concern for amio toxicity. Amiodarone stopped. ESR not markedly elevated (18). Hi-Res CT pending. Empirically started on prednisone.   2 . Chronic systolic CHF with HM III LVAD implant 04/20/17 - Volume improving - RHC as above - Continue po lasix   3. VAD management - VAD interrogated personally. Parameters stable. Ramp RHC on 04/15. Speed increased to 5600.  - INR 1.9 (INR goal 1.5-2). Warfarin dosing discussed with PharmD. - LDH & hgb stable - DL site ok   4. CKD3: basleine 1.5-1.8 - sCr 1.6 today   5. Hx of Ventricular tachycardia - Had ICD shock x 2 for VT on 11/24 (device reprogrammed at the time) - S/p fracture lead removal and ICD exchange 1/25 -Amio stopped   6. CAD:  s/p PCI RCA/PLOM with DES x2 and DES to mild LAD 1/18 - No s/s angina - Continue statin. Off ASA due to GIB   7. Urinary retention: reports intermittent dysuria in the past week - Continue Flomax 0.4 mg daily   8. HTN: Stable - continue norvasc 5 mg   9. Microcytic anemia - received 200 mg IV Venofer in 03/25.  - Discussed with PharmD. Will likely need additional supplementation. Recheck iron stores and decide on dose.  I reviewed the LVAD parameters from today, and compared the results to the patient's prior recorded data.  No programming changes were made.  The LVAD is functioning within specified  parameters.  The patient performs LVAD self-test daily.  LVAD interrogation was negative for any significant power changes, alarms or PI events/speed drops.  LVAD equipment check completed and is in good working order.  Back-up equipment present.  LVAD education done on emergency procedures and precautions and reviewed exit site care.  Length of Stay: 6  FINCH, LINDSAY N, PA-C 08/09/2023, 9:31 AM  VAD Team --- VAD ISSUES ONLY--- Pager [phone] (7am - 7am)  Advanced Heart Failure Team  Pager [phone] (M-F; 7a - 5p)  Please contact [facility] for night-coverage after hours (5p -7a ) and weekends on [email or web address]  Patient seen and examined with the above-signed Advanced Practice Provider and/or Housestaff. I personally reviewed laboratory data, imaging studies and relevant notes. I independently examined the patient and formulated the important aspects of the plan. I have edited the note to reflect any of my changes or salient points. I have personally discussed the plan with the patient and/or family.  RHC results. Reviewed with him.  Denies SOB but has cough. Sats in mids 80s on RA  Hi-rest CT not read yet but reviewed with Pulmonary. Has significant underlying lung disease + atx and fluid in fissure  ESR < 20.   INR 1.9  General:  NAD. + cough  HEENT: normal  Neck: supple. JVP 7-8 Carotids 2+ bilat; no bruits. No lymphadenopathy or thryomegaly appreciated. Cor: LVAD hum.  Lungs: Mild crackles Abdomen: soft, nontender, non-distended. No hepatosplenomegaly. No bruits or masses. Good bowel sounds. Driveline site clean. Anchor in place.  Extremities: no cyanosis, clubbing, rash. Warm no edema  Neuro: alert & oriented x 3. No focal deficits. Moves all 4 without problem   Suspect underlying lung disease is major issure here with some volume overload.   Continue diuresis as tolerated. Can stop steroids as no evidence amio toxicity. (Would hold amio for now)  Will ask speech to  evaluate for aspiration.   Continue abx and PPI. Will need home O2  Encourage IS.  Discussed warfarin dosing with PharmD personally.  VAD interrogated personally. Parameters stable.  Jules Oar, MD  4:54 PM      VAD interrogated personally. Parameters stable.

## 2023-08-09 NOTE — Plan of Care (Signed)
  Problem: Education: Goal: Patient will understand all VAD equipment and how it functions Outcome: Progressing Goal: Patient will be able to verbalize current INR target range and antiplatelet therapy for discharge home Outcome: Progressing   Problem: Cardiac: Goal: LVAD will function as expected and patient will experience no clinical alarms Outcome: Progressing   Problem: Education: Goal: Knowledge of General Education information will improve Description: Including pain rating scale, medication(s)/side effects and non-pharmacologic comfort measures Outcome: Progressing   Problem: Health Behavior/Discharge Planning: Goal: Ability to manage health-related needs will improve Outcome: Progressing   Problem: Clinical Measurements: Goal: Ability to maintain clinical measurements within normal limits will improve Outcome: Progressing Goal: Will remain free from infection Outcome: Progressing Goal: Diagnostic test results will improve Outcome: Progressing Goal: Cardiovascular complication will be avoided Outcome: Progressing   Problem: Activity: Goal: Risk for activity intolerance will decrease Outcome: Progressing   Problem: Nutrition: Goal: Adequate nutrition will be maintained Outcome: Progressing   Problem: Coping: Goal: Level of anxiety will decrease Outcome: Progressing   Problem: Elimination: Goal: Will not experience complications related to bowel motility Outcome: Progressing Goal: Will not experience complications related to urinary retention Outcome: Progressing   Problem: Pain Managment: Goal: General experience of comfort will improve and/or be controlled Outcome: Progressing   Problem: Safety: Goal: Ability to remain free from injury will improve Outcome: Progressing   Problem: Skin Integrity: Goal: Risk for impaired skin integrity will decrease Outcome: Progressing   Problem: Education: Goal: Understanding of CV disease, CV risk reduction, and  recovery process will improve Outcome: Progressing Goal: Individualized Educational Video(s) Outcome: Progressing   Problem: Activity: Goal: Ability to return to baseline activity level will improve Outcome: Progressing   Problem: Cardiovascular: Goal: Ability to achieve and maintain adequate cardiovascular perfusion will improve Outcome: Progressing Goal: Vascular access site(s) Level 0-1 will be maintained Outcome: Progressing   Problem: Health Behavior/Discharge Planning: Goal: Ability to safely manage health-related needs after discharge will improve Outcome: Progressing

## 2023-08-09 NOTE — Care Management Important Message (Signed)
 Important Message  Patient Details  Name: David [name] MRN: [id number] Date of Birth: Apr 08, 1966   Important Message Given:  Yes - Medicare IM     Wynonia Hedges 08/09/2023, 2:58 PM

## 2023-08-09 NOTE — Plan of Care (Signed)
  Problem: Education: Goal: Patient will understand all VAD equipment and how it functions Outcome: Progressing Goal: Patient will be able to verbalize current INR target range and antiplatelet therapy for discharge home Outcome: Progressing   Problem: Cardiac: Goal: LVAD will function as expected and patient will experience no clinical alarms Outcome: Progressing   Problem: Education: Goal: Knowledge of General Education information will improve Description: Including pain rating scale, medication(s)/side effects and non-pharmacologic comfort measures Outcome: Progressing   Problem: Health Behavior/Discharge Planning: Goal: Ability to manage health-related needs will improve Outcome: Progressing   Problem: Clinical Measurements: Goal: Ability to maintain clinical measurements within normal limits will improve Outcome: Progressing Goal: Will remain free from infection Outcome: Progressing Goal: Diagnostic test results will improve Outcome: Progressing Goal: Respiratory complications will improve Outcome: Progressing Goal: Cardiovascular complication will be avoided Outcome: Progressing   Problem: Activity: Goal: Risk for activity intolerance will decrease Outcome: Progressing   Problem: Nutrition: Goal: Adequate nutrition will be maintained Outcome: Progressing   Problem: Coping: Goal: Level of anxiety will decrease Outcome: Progressing   Problem: Elimination: Goal: Will not experience complications related to bowel motility Outcome: Progressing Goal: Will not experience complications related to urinary retention Outcome: Progressing   Problem: Pain Managment: Goal: General experience of comfort will improve and/or be controlled Outcome: Progressing   Problem: Safety: Goal: Ability to remain free from injury will improve Outcome: Progressing   Problem: Skin Integrity: Goal: Risk for impaired skin integrity will decrease Outcome: Progressing   Problem:  Education: Goal: Understanding of CV disease, CV risk reduction, and recovery process will improve Outcome: Progressing Goal: Individualized Educational Video(s) Outcome: Progressing   Problem: Activity: Goal: Ability to return to baseline activity level will improve Outcome: Progressing   Problem: Cardiovascular: Goal: Ability to achieve and maintain adequate cardiovascular perfusion will improve Outcome: Progressing Goal: Vascular access site(s) Level 0-1 will be maintained Outcome: Progressing   Problem: Health Behavior/Discharge Planning: Goal: Ability to safely manage health-related needs after discharge will improve Outcome: Progressing

## 2023-08-10 ENCOUNTER — Inpatient Hospital Stay (HOSPITAL_COMMUNITY)

## 2023-08-10 DIAGNOSIS — Z95811 Presence of heart assist device: Secondary | ICD-10-CM | POA: Diagnosis not present

## 2023-08-10 DIAGNOSIS — I5023 Acute on chronic systolic (congestive) heart failure: Secondary | ICD-10-CM | POA: Diagnosis not present

## 2023-08-10 LAB — CBC
HCT: 37 % — ABNORMAL LOW (ref 39.0–52.0)
Hemoglobin: 11.5 g/dL — ABNORMAL LOW (ref 13.0–17.0)
MCH: 24.4 pg — ABNORMAL LOW (ref 26.0–34.0)
MCHC: 31.1 g/dL (ref 30.0–36.0)
MCV: 78.6 fL — ABNORMAL LOW (ref 80.0–100.0)
Platelets: 243 10*3/uL (ref 150–400)
RBC: 4.71 MIL/uL (ref 4.22–5.81)
RDW: 15.8 % — ABNORMAL HIGH (ref 11.5–15.5)
WBC: 13.1 10*3/uL — ABNORMAL HIGH (ref 4.0–10.5)
nRBC: 0 % (ref 0.0–0.2)

## 2023-08-10 LAB — MAGNESIUM: Magnesium: 1.8 mg/dL (ref 1.7–2.4)

## 2023-08-10 LAB — BASIC METABOLIC PANEL WITH GFR
Anion gap: 7 (ref 5–15)
BUN: 15 mg/dL (ref 6–20)
CO2: 23 mmol/L (ref 22–32)
Calcium: 9.4 mg/dL (ref 8.9–10.3)
Chloride: 106 mmol/L (ref 98–111)
Creatinine, Ser: 1.41 mg/dL — ABNORMAL HIGH (ref 0.61–1.24)
GFR, Estimated: 57 mL/min — ABNORMAL LOW (ref >60)
Glucose, Bld: 101 mg/dL — ABNORMAL HIGH (ref 70–99)
Potassium: 4.5 mmol/L (ref 3.5–5.1)
Sodium: 136 mmol/L (ref 135–145)

## 2023-08-10 LAB — PROTIME-INR
INR: 1.5 — ABNORMAL HIGH (ref 0.8–1.2)
Prothrombin Time: 18.5 s — ABNORMAL HIGH (ref 11.4–15.2)

## 2023-08-10 LAB — LACTATE DEHYDROGENASE: LDH: 175 U/L (ref 98–192)

## 2023-08-10 MED ORDER — FUROSEMIDE 10 MG/ML IJ SOLN
80.0000 mg | Freq: Once | INTRAMUSCULAR | Status: AC
  Administered 2023-08-10: 80 mg via INTRAVENOUS
  Filled 2023-08-10: qty 8

## 2023-08-10 MED ORDER — MAGNESIUM SULFATE 2 GM/50ML IV SOLN
2.0000 g | Freq: Once | INTRAVENOUS | Status: AC
  Administered 2023-08-10: 2 g via INTRAVENOUS
  Filled 2023-08-10: qty 50

## 2023-08-10 MED ORDER — WARFARIN SODIUM 2 MG PO TABS
2.0000 mg | ORAL_TABLET | Freq: Once | ORAL | Status: AC
  Administered 2023-08-10: 2 mg via ORAL
  Filled 2023-08-10: qty 1

## 2023-08-10 NOTE — Progress Notes (Signed)
 LVAD Coordinator Rounding Note:  Admitted 08/04/23 following a hospital follow up in VAD Clinic. Pt reported increased bloating and SOB since Tuesday. SPO2 79% on RA. Pt placed on 3L [place] in VAD Clinic and admitted to [facility].   HM3 LVAD implanted on 04/20/17 by Dr Sherene Dilling under destination therapy criteria.  Pt lying in bed. States he is feeling much better. Denies shortness of breath currently. But pts oxygen requirement is up to 5L/[place] of oxygen.   Transitioned to Augmentin.   INR 1.9 today.  Speed increased to 5600 during RHC yesterday. Pt had CT high res to assess lungs.  Vital signs: Temp: 97.7 HR: 90 Doppler Pressure: 88 Automatic BP: 93/82 (88) O2 Sat: 93% on 3L/[place] Wt: 169.7>165>153.9>154.5>153.6 lbs    LVAD interrogation :  Speed: 5600 Flow: 5.3 Power: 4.3 w PI: 3.1   Alarms: none Events:  none Hematocrit: 36  Fixed speed: 5600 Low speed limit: 5300  Drive Line: Existing dressing clean, dry, and intact. Anchor correctly applied. Weekly dressing maintained by pt's wife at home. Weekly dressing changes using weekly kit by wife Tarri Farm. Next dressing change due: 08/14/23.   Labs:  LDH trend: 169>179>169>200>175  INR trend: 3.5>3.0>2.4>1.9>1.5  Cr trend: 1.66>1.41>1.74>1.62>1.41  WBC trend: 8.5>6.9>7.6>13.1  Anticoagulation Plan: -INR Goal: 1.5-2.0 -ASA Dose: none - Coumadin: ongoing  Device: -Medtronic BiV -Therapies: ON  Arrythmias: Hx Afib/AF- on Amiodarone 200 mg daily- stopped   Plan/Recommendations:  1. Page VAD coordinator with any drive line or equipment concerns 2. Weekly drive line dressing changes by bedside RN  Adams Adams RN VAD Coordinator  Office: [phone]  24/7 Pager: [phone]

## 2023-08-10 NOTE — Progress Notes (Addendum)
 Advanced Heart Failure VAD Team Note  PCP-Cardiologist: Arvilla Meres, MD   Chief Complaint: Heart Failure/PNA  Subjective:    Still requiring supplemental oxygen. Desats to 80% on RA.  Denies shortness of breath. Ambulated 800 feet with mobility, on O2.   LVAD INTERROGATION:  HeartMate III LVAD:   Flow 5.5 liters/min, speed 5600, power 4.4 PI 3.0. 2 PI events  Objective:    Vital Signs:   Temp:  [97.7 F (36.5 C)-99.6 F (37.6 C)] 98 F (36.7 C) (04/17 1132) Pulse Rate:  [82-98] 82 (04/17 0753) Resp:  [18-25] 18 (04/17 1132) BP: (91-112)/(70-86) 112/71 (04/17 1132) SpO2:  [90 %-99 %] 99 % (04/17 1132) Weight:  [68.4 kg] 68.4 kg (04/17 0646) Last BM Date : 08/09/23  Mean arterial Pressure 80s  Intake/Output:  Intake/Output Summary (Last 24 hours) at 08/10/2023 1211 Last data filed at 08/10/2023 0828 Gross per 24 hour  Intake 1320 ml  Output 1050 ml  Net 270 ml    Physical Exam    Physical Exam: GENERAL: Chronically ill appearing. Lying in bed. NECK: JVP to midneck CARDIAC:  Mechanical heart sounds with LVAD hum present.  LUNGS:  No respiratory distress on supplemental O2. ABDOMEN:  Soft, round, nontender, positive bowel sounds x4.     LVAD exit site:   Dressing dry and intact.  Stabilization device present and accurately applied.   EXTREMITIES:  No edema  NEUROLOGIC:  Alert and oriented x 4. Affect pleasant.      Telemetry   SR 80s  Labs   Basic Metabolic Panel: Recent Labs  Lab 08/06/23 0156 08/07/23 0211 08/08/23 0214 08/08/23 0818 08/09/23 0221 08/09/23 0223 08/10/23 0645  NA 138 137 138 141  140 137  --  136  K 3.7 4.2 3.9 4.0  4.2 4.6  --  4.5  CL 102 105 103  --  104  --  106  CO2 25 22 26   --  25  --  23  GLUCOSE 147* 94 92  --  89  --  101*  BUN 15 13 14   --  14  --  15  CREATININE 1.67* 1.41* 1.74*  --  1.62*  --  1.41*  CALCIUM 8.9 8.8* 9.0  --  8.8*  --  9.4  MG 1.9 2.0 1.8  --   --  1.8 1.8   Liver Function Tests: No  results for input(s): "AST", "ALT", "ALKPHOS", "BILITOT", "PROT", "ALBUMIN" in the last 168 hours.  CBC: Recent Labs  Lab 08/05/23 0203 08/06/23 0156 08/07/23 0211 08/08/23 0214 08/08/23 0818 08/10/23 0645  WBC 9.0 7.6 6.9 7.6  --  13.1*  HGB 11.2* 11.3* 10.8* 11.4* 12.9*  12.9* 11.5*  HCT 34.9* 35.3* 34.4* 36.2* 38.0*  38.0* 37.0*  MCV 78.1* 79.0* 78.9* 78.4*  --  78.6*  PLT 197 202 206 235  --  243   INR: Recent Labs  Lab 08/06/23 0156 08/07/23 0211 08/08/23 0214 08/09/23 0223 08/10/23 0645  INR 3.0* 3.0* 2.4* 1.9* 1.5*   Imaging   No results found.   Medications:    Scheduled Medications:  amLODipine  5 mg Oral Daily   atorvastatin  40 mg Oral Daily   furosemide  40 mg Oral Daily   magnesium oxide  400 mg Oral BID   pantoprazole  40 mg Oral Daily   polyethylene glycol  17 g Oral Daily   potassium chloride  40 mEq Oral Daily   senna-docusate  1 tablet Oral QHS  sodium chloride flush  3 mL Intravenous Q12H   tamsulosin  0.4 mg Oral Daily   traZODone  50 mg Oral QHS   warfarin  2 mg Oral ONCE-1600   Warfarin - Pharmacist Dosing Inpatient   Does not apply q1600   Infusions:  magnesium sulfate bolus IVPB      PRN Medications: acetaminophen, acetaminophen, ondansetron (ZOFRAN) IV, sodium chloride flush  Patient Profile   David [name] is a 58 y.o. male with history of ICD (09 PCI to LAD, mRCA, '10 PCI to Lcx and '12 mRCA), HFrEF (EF ~20% for > 5 years), HLD and HTN. S/P LVAD HM-III 04/20/18.   Admitted with acute hypoxic respiratory failure, possible PNA.   Assessment/Plan:    1. Acute Hypoxic Respiratory Failure: admitted earlier in the month with Flu A. ?PNA post viral infection. Also volume overloaded on exam on admit. CXR with pulm congestion. On [place].  - Azithro/rochepin switched to Augmentin on 4/14 - Has been hypoxic despite adequate diuresis (see RHC). Now on 6L [place] to maintain O2 sats. Will need home O2. - Hi-Res chest CT w/ GG airspace  disease in right lung, pulmonary edema, ATX left base, emphysema. ? Aspiration. On PPI. Passed swallow evaluation today. - ESR just mildly elevated and no evidence of lung toxicity on CT.  No steroids. Continue to hold amiodarone for now.    2 . Chronic systolic CHF with HM III LVAD implant 04/20/17 - RHC 04/15: RA 6, PA mean 35, PCWP 19, Fick CI 3.1, TD CI 3.0 - Has been on 40 mg po lasix daily. Will give 80 mg IV given persistent pulmonary edema on CT.    3. VAD management - VAD interrogated personally. Parameters stable. Ramp RHC on 04/15. Speed increased to 5600.  - INR 1.5 (INR goal 1.5-2). Warfarin dosing per PharmD. - LDH and Hgb stable - DL okay   4. CKD3: basleine 1.5-1.8 - sCr 1.4 today   5. Hx of Ventricular tachycardia - Had ICD shock x 2 for VT on 11/24 (device reprogrammed at the time) - S/p fracture lead removal and ICD exchange 1/25 -Amio stopped   6. CAD:  s/p PCI RCA/PLOM with DES x2 and DES to mild LAD 1/18 - No s/s angina - Continue statin. Off ASA due to GIB   7. Urinary retention: reports intermittent dysuria in the past week - Continue Flomax 0.4 mg daily   8. HTN: Stable - continue norvasc 5 mg   9. Microcytic anemia - received 200 mg IV Venofer in 03/25.  - Iron studies rechecked this admit, improving  I reviewed the LVAD parameters from today, and compared the results to the patient's prior recorded data.  No programming changes were made.  The LVAD is functioning within specified parameters.  The patient performs LVAD self-test daily.  LVAD interrogation was negative for any significant power changes, alarms or PI events/speed drops.  LVAD equipment check completed and is in good working order.  Back-up equipment present.   LVAD education done on emergency procedures and precautions and reviewed exit site care.  Length of Stay: 7  FINCH, Angelena Barber, PA-C 08/10/2023, 12:11 PM  VAD Team --- VAD ISSUES ONLY--- Pager [phone] (7am - 7am)  Advanced  Heart Failure Team  Pager [phone] (M-F; 7a - 5p)  Please contact [facility] for night-coverage after hours (5p -7a ) and weekends on [email or web address]  Patient seen and examined with the above-signed Advanced Practice Provider and/or Housestaff. I personally reviewed laboratory data,  imaging studies and relevant notes. I independently examined the patient and formulated the important aspects of the plan. I have edited the note to reflect any of my changes or salient points. I have personally discussed the plan with the patient and/or family.  Continues to desat and requires O2. Says he feels ok .  Underwent swallow study today with no aspiration.   General:  NAD.  HEENT: normal  Neck: supple. JVP 6-7.  Carotids 2+ bilat; no bruits. No lymphadenopathy or thryomegaly appreciated. Cor: LVAD hum.  Lungs: mild crackles Abdomen: obese soft, nontender, non-distended. No hepatosplenomegaly. No bruits or masses. Good bowel sounds. Driveline site clean. Anchor in place.  Extremities: no cyanosis, clubbing, rash. Warm no edema  Neuro: alert & oriented x 3. No focal deficits. Moves all 4 without problem   Continues with mild hypoxia. Suspect mostly pulmonary in nature from recent flu but may also have component of HF. Wil give IV lasix. Encouraged IS and we walked through technique together.   Will review hi-res CT with [facility] given mention of progressive lymphadenopathy.   VAD interrogated personally. Parameters stable.  INR 1.5 Discussed warfarin dosing with PharmD personally.  Will arrange home O2.   Jules Oar, MD  5:23 PM

## 2023-08-10 NOTE — Plan of Care (Signed)
  Problem: Education: Goal: Patient will understand all VAD equipment and how it functions Outcome: Progressing Goal: Patient will be able to verbalize current INR target range and antiplatelet therapy for discharge home Outcome: Progressing   Problem: Cardiac: Goal: LVAD will function as expected and patient will experience no clinical alarms Outcome: Progressing   Problem: Education: Goal: Knowledge of General Education information will improve Description: Including pain rating scale, medication(s)/side effects and non-pharmacologic comfort measures Outcome: Progressing   Problem: Health Behavior/Discharge Planning: Goal: Ability to manage health-related needs will improve Outcome: Progressing   Problem: Clinical Measurements: Goal: Ability to maintain clinical measurements within normal limits will improve Outcome: Progressing Goal: Will remain free from infection Outcome: Progressing Goal: Diagnostic test results will improve Outcome: Progressing Goal: Respiratory complications will improve Outcome: Progressing Goal: Cardiovascular complication will be avoided Outcome: Progressing   Problem: Activity: Goal: Risk for activity intolerance will decrease Outcome: Progressing   Problem: Nutrition: Goal: Adequate nutrition will be maintained Outcome: Progressing   Problem: Coping: Goal: Level of anxiety will decrease Outcome: Progressing   Problem: Elimination: Goal: Will not experience complications related to bowel motility Outcome: Progressing Goal: Will not experience complications related to urinary retention Outcome: Progressing   Problem: Pain Managment: Goal: General experience of comfort will improve and/or be controlled Outcome: Progressing   Problem: Safety: Goal: Ability to remain free from injury will improve Outcome: Progressing   Problem: Skin Integrity: Goal: Risk for impaired skin integrity will decrease Outcome: Progressing   Problem:  Education: Goal: Understanding of CV disease, CV risk reduction, and recovery process will improve Outcome: Progressing Goal: Individualized Educational Video(s) Outcome: Progressing   Problem: Activity: Goal: Ability to return to baseline activity level will improve Outcome: Progressing   Problem: Cardiovascular: Goal: Ability to achieve and maintain adequate cardiovascular perfusion will improve Outcome: Progressing Goal: Vascular access site(s) Level 0-1 will be maintained Outcome: Progressing   Problem: Health Behavior/Discharge Planning: Goal: Ability to safely manage health-related needs after discharge will improve Outcome: Progressing

## 2023-08-10 NOTE — Progress Notes (Addendum)
 SATURATION QUALIFICATIONS: (This note is used to comply with regulatory documentation for home oxygen)  Patient Saturations on Room Air at Rest = 85%  Patient Saturations on Room Air while Ambulating = 80%  Patient Saturations on 6 Liters of oxygen while Ambulating = 88%  Please briefly explain why patient needs home oxygen: hypoxia at rest and with exertion

## 2023-08-10 NOTE — Progress Notes (Signed)
 Nurse requested Mobility Specialist to perform oxygen saturation test with pt which includes removing pt from oxygen both at rest and while ambulating.  Below are the results from that testing.     Patient Saturations on Room Air at Rest = spO2 80%  Patient Saturations on 6 Liters of oxygen while Ambulating = sp02 88%> .  At end of testing pt left in room on 5  Liters of oxygen.  Reported results to nurse.   Janit Meline Mobility Specialist Please contact via SecureChat or [employer] [phone]

## 2023-08-10 NOTE — Progress Notes (Signed)
 PHARMACY - ANTICOAGULATION CONSULT NOTE  Pharmacy Consult for warfarin Indication:  LVAD  Allergies  Allergen Reactions   Plavix [Clopidogrel Bisulfate] Hives    Patient Measurements: Height: 5\' 5"  (165.1 cm) Weight: 68.4 kg (150 lb 12.8 oz) IBW/kg (Calculated) : 61.5 HEPARIN DW (KG): 74.4  Vital Signs: Temp: 98 F (36.7 C) (04/17 1132) Temp Source: Oral (04/17 1132) BP: 112/71 (04/17 1132) Pulse Rate: 82 (04/17 0753)  Labs: Recent Labs    08/08/23 0214 08/08/23 0818 08/09/23 0221 08/09/23 0223 08/10/23 0645  HGB 11.4* 12.9*  12.9*  --   --  11.5*  HCT 36.2* 38.0*  38.0*  --   --  37.0*  PLT 235  --   --   --  243  LABPROT 26.2*  --   --  21.6* 18.5*  INR 2.4*  --   --  1.9* 1.5*  CREATININE 1.74*  --  1.62*  --  1.41*    Estimated Creatinine Clearance: 49.1 mL/min (A) (by C-G formula based on SCr of 1.41 mg/dL (H)).   Medical History: Past Medical History:  Diagnosis Date   AICD (automatic cardioverter/defibrillator) present    Anemia    Anxiety    CAD (coronary artery disease) 2009   AMI in 12/2007 with PCI to LAD, staged PCI to  mid/distal RCA, NSTEMI in 02/2009 with BMS to LCx   CHF (congestive heart failure) (HCC)    Chronic kidney disease 11/03/2016   stage 3 kidney disease   Dysrhythmia    "arrythmia problems at some point", "fatal rhythms"   History of blood transfusion    "I was bleeding from was rectum" (04/19/2017)   HLD (hyperlipidemia)    HTN (hypertension)    Hyperlipidemia 03/15/2011   Ischemic cardiomyopathy    Admitted in 07/2010 with CHF exacerbation   Myocardial infarction (HCC)    "I've had 4" (04/19/2017)   Pneumonia 2018 X 1   Seizures (HCC)    one seizure in 04/2016 during cardiac event (04/19/2017)   STEMI 2009 (anterior), 2010 (lateral), and 2012 (inferior) 03/14/2011    Medications:  Scheduled:   amLODipine  5 mg Oral Daily   atorvastatin  40 mg Oral Daily   furosemide  40 mg Oral Daily   magnesium oxide  400 mg Oral  BID   pantoprazole  40 mg Oral Daily   polyethylene glycol  17 g Oral Daily   potassium chloride  40 mEq Oral Daily   senna-docusate  1 tablet Oral QHS   sodium chloride flush  3 mL Intravenous Q12H   tamsulosin  0.4 mg Oral Daily   traZODone  50 mg Oral QHS   warfarin  2 mg Oral ONCE-1600   Warfarin - Pharmacist Dosing Inpatient   Does not apply q1600    Assessment: 57 yom presenting with hypoxia. Hx of LVAD HM3 in 2019 - on warfarin PTA. Last dose on 4/8 - PTA regimen is 1 mg daily except 2 mg MWF.   INR 3.9 at admit > fell to 1.5 after holding warfarin.  CBC is stable with no signs of bleeding. LDH is up but stable. Will give a boost today   Goal of Therapy:  INR 1.5-2 Monitor platelets by anticoagulation protocol: Yes   Plan:  Give warfarin 2 mg today Monitor daily INR, CBC, and for s/sx of bleeding    Leota Sauers Pharm.D. CPP, BCPS Clinical Pharmacist [phone] 08/10/2023 11:53 AM   [facility] pharmacy phone numbers are listed on [email or web address]

## 2023-08-10 NOTE — Progress Notes (Signed)
 Mobility Specialist Progress Note;   08/10/23 0945  Mobility  Activity Ambulated with assistance in hallway  Level of Assistance Standby assist, set-up cues, supervision of patient - no hands on  Assistive Device None  Distance Ambulated (ft) 800 ft  Activity Response Tolerated well  Mobility Referral Yes  Mobility visit 1 Mobility  Mobility Specialist Start Time (ACUTE ONLY) 0945  Mobility Specialist Stop Time (ACUTE ONLY) 1008  Mobility Specialist Time Calculation (min) (ACUTE ONLY) 23 min   Pt agreeable to mobility and walking O2 test. On 5LO2 upon arrival. Pt independent with LVAD equipment. Required no physical assistance during ambulation. Upon standing and moving around room, SPO2 desat to 80%. Required up to 6LO2 to stay [facility] while ambulating, although no SOB displayed. Pt returned back to room with all needs met, back on 5LO2. RN notified.   Janit Meline Mobility Specialist Please contact via SecureChat or [employer] [phone]

## 2023-08-10 NOTE — Evaluation (Signed)
 Clinical/Bedside Swallow Evaluation Patient Details  Name: David [name] MRN: [id number] Date of Birth: [birth year]  Today's Date: 08/10/2023 Time:        Past Medical History:  Past Medical History:  Diagnosis Date   AICD (automatic cardioverter/defibrillator) present    Anemia    Anxiety    CAD (coronary artery disease) 2009   AMI in 12/2007 with PCI to LAD, staged PCI to  mid/distal RCA, NSTEMI in 02/2009 with BMS to LCx   CHF (congestive heart failure) (HCC)    Chronic kidney disease 11/03/2016   stage 3 kidney disease   Dysrhythmia    "arrythmia problems at some point", "fatal rhythms"   History of blood transfusion    "I was bleeding from was rectum" (04/19/2017)   HLD (hyperlipidemia)    HTN (hypertension)    Hyperlipidemia 03/15/2011   Ischemic cardiomyopathy    Admitted in 07/2010 with CHF exacerbation   Myocardial infarction (HCC)    "I've had 4" (04/19/2017)   Pneumonia 2018 X 1   Seizures (HCC)    one seizure in 04/2016 during cardiac event (04/19/2017)   STEMI 2009 (anterior), 2010 (lateral), and 2012 (inferior) 03/14/2011   Past Surgical History:  Past Surgical History:  Procedure Laterality Date   AORTIC VALVE REPAIR N/A 04/20/2017   Procedure: AORTIC VALVE REPAIR;  Surgeon: Bartley Lightning, MD;  [facility]: [facility] OR;  Service: Open Heart Surgery;  Laterality: N/A;   BIV ICD INSERTION CRT-D N/A 08/01/2016   Procedure: BiV ICD Insertion CRT-D;  Surgeon: Will Cortland Ding, MD;  Location: [facility] INVASIVE CV LAB;  Service: Cardiovascular;  Laterality: N/A;   CARDIAC CATHETERIZATION N/A 05/05/2016   Procedure: Right/Left Heart Cath and Coronary Angiography;  Surgeon: Mardell Shade, MD;  Location: [facility] INVASIVE CV LAB;  Service: Cardiovascular;  Laterality: N/A;   CARDIAC CATHETERIZATION N/A 05/09/2016   Procedure: Coronary Stent Intervention;  Surgeon: Peter M Swaziland, MD;  Location: [facility] INVASIVE CV LAB;  Service: Cardiovascular;  Laterality: N/A;   CARDIAC  CATHETERIZATION N/A 05/09/2016   Procedure: Intravascular Pressure Wire/FFR Study;  Surgeon: Peter M Swaziland, MD;  Location: [facility] INVASIVE CV LAB;  Service: Cardiovascular;  Laterality: N/A;   CARDIOVERSION N/A 10/17/2018   Procedure: CARDIOVERSION;  Surgeon: Mardell Shade, MD;  Location: [facility] ENDOSCOPY;  Service: Cardiovascular;  Laterality: N/A;   CORONARY ANGIOPLASTY WITH STENT PLACEMENT     "i've got a total of 12 stents" (04/19/2017)   ELECTROPHYSIOLOGY STUDY N/A 06/24/2016   Procedure: Electrophysiology Study;  Surgeon: Verona Goodwill, MD;  Location: [facility] INVASIVE CV LAB;  Service: Cardiovascular;  Laterality: N/A;   ENTEROSCOPY N/A 06/03/2017   Procedure: ENTEROSCOPY;  Surgeon: Ace Holder, MD;  Location: [facility] ENDOSCOPY;  Service: Gastroenterology;  Laterality: N/A;   EPICARDIAL PACING LEAD PLACEMENT Left 11/07/2016   Procedure: LV EPICARDIAL PACING LEAD PLACEMENT VIA LEFT MINI THORACOTOMY;  Surgeon: Bartley Lightning, MD;  Location: [facility] OR;  Service: Thoracic;  Laterality: Left;   ICD IMPLANT N/A 06/24/2016   Procedure: POSSIBLE  ICD Implant;  Surgeon: Verona Goodwill, MD;  Location: [facility] INVASIVE CV LAB;  Service: Cardiovascular;  Laterality: N/A;   INSERTION OF IMPLANTABLE LEFT VENTRICULAR ASSIST DEVICE N/A 04/20/2017   Procedure: INSERTION OF IMPLANTABLE LEFT VENTRICULAR ASSIST DEVICE, Aortic Valve repair;  Surgeon: Bartley Lightning, MD;  Location: [facility] OR;  Service: Open Heart Surgery;  Laterality: N/A;  Heartmate 3   IR FLUORO GUIDE CV LINE RIGHT  05/08/2017   IR FLUORO GUIDE CV  MIDLINE PICC RIGHT  03/14/2017   IR REMOVAL TUN CV CATH W/O FL  05/25/2017   IR US GUIDE VASC ACCESS RIGHT  03/14/2017   IR US GUIDE VASC ACCESS RIGHT  05/08/2017   LEAD EXTRACTION N/A 05/24/2023   Procedure: LEAD EXTRACTION;  Surgeon: Lanier Prude, MD;  [facility] INVASIVE CV LAB;  Service: Cardiovascular;  Laterality: N/A;   LEFT HEART CATHETERIZATION WITH CORONARY ANGIOGRAM N/A 03/13/2011    Procedure: LEFT HEART CATHETERIZATION WITH CORONARY ANGIOGRAM;  Surgeon: Runell Gess, MD;  Location: [facility] CATH LAB;  Service: Cardiovascular;  Laterality: N/A;   MULTIPLE EXTRACTIONS WITH ALVEOLOPLASTY N/A 04/12/2017   Procedure: Extraction of tooth #'s 2, 5-12, 17, 20-29 with alveoloplasty amd maxillary right buccal exostoses reductions.;  Surgeon: Charlynne Pander, DDS;  Location: [facility] OR;  Service: Oral Surgery;  Laterality: N/A;   RIGHT HEART CATH N/A 04/19/2017   Procedure: RIGHT HEART CATH;  Surgeon: Dolores Patty, MD;  [facility]: [facility] INVASIVE CV LAB;  Service: Cardiovascular;  Laterality: N/A;   RIGHT HEART CATH N/A 08/08/2023   Procedure: RIGHT HEART CATH;  Surgeon: Dolores Patty, MD;  [facility]: [facility] INVASIVE CV LAB;  Service: Cardiovascular;  Laterality: N/A;   RIGHT/LEFT HEART CATH AND CORONARY ANGIOGRAPHY N/A 03/10/2017   Procedure: RIGHT/LEFT HEART CATH AND CORONARY ANGIOGRAPHY;  Surgeon: Dolores Patty, MD;  Location: [facility] INVASIVE CV LAB;  Service: Cardiovascular;  Laterality: N/A;   TEE WITHOUT CARDIOVERSION N/A 04/20/2017   Procedure: TRANSESOPHAGEAL ECHOCARDIOGRAM (TEE);  Surgeon: Alleen Borne, MD;  Location: [facility] OR;  Service: Open Heart Surgery;  Laterality: N/A;   TEE WITHOUT CARDIOVERSION N/A 10/17/2018   Procedure: TRANSESOPHAGEAL ECHOCARDIOGRAM (TEE);  Surgeon: Dolores Patty, MD;  Location: [facility] ENDOSCOPY;  Service: Cardiovascular;  Laterality: N/A;   TRANSESOPHAGEAL ECHOCARDIOGRAM (CATH LAB) N/A 05/24/2023   Procedure: TRANSESOPHAGEAL ECHOCARDIOGRAM;  Surgeon: Lanier Prude, MD;  Location: [facility] INVASIVE CV LAB;  Service: Cardiovascular;  Laterality: N/A;   HPI:  58 y.o. male presented 4/10 with acute hypoxic respiratory failure. CXR done, which showed pulmonary edema. He was given IV lasix and started on IV azithromycin and rochephin for suspected pneumonia. Recent CT chest from 04/15 displayed "Heterogeneous and ground-glass airspace disease throughout  the dependent right lung, concerning for infection or aspiration."   PMH significant of ICD, HFrEF (EF ~20% for > 5 years), HLD and HTN. S/P LVAD HM-III 04/20/18.    Assessment / Plan / Recommendation  Clinical Impression  Pt was seen for bedside swallow eval to determine need for intervention. Pt notes no self-perceived changes to swallow function and reports coughing not specific to meal times. Pt's oral motor exam was [facility]. Pt demonstrated no overt signs of aspiration or dysphagia during PO trials. Due to recent chest imaging and concerns from care team regarding aspiration as a potential factor for pt's PNA and pt's LVAD status, MBS is recommended to objectively identify or rule out dysphagia/aspiration.      Aspiration Risk  Mild aspiration risk    Diet Recommendation           Other  Recommendations      Recommendations for follow up therapy are one component of a multi-disciplinary discharge planning process, led by the attending physician.  Recommendations may be updated based on patient status, additional functional criteria and insurance authorization.  Follow up Recommendations        Assistance Recommended at Discharge    Functional Status Assessment    Frequency and Duration  Prognosis        Swallow Study   General Date of Onset: 08/04/23 HPI: 58 y.o. male presented 4/10 with acute hypoxic respiratory failure. CXR done, which showed pulmonary edema. He was given IV lasix and started on IV azithromycin and rochephin for suspected pneumonia. Recent CT chest from 04/15 displayed "Heterogeneous and ground-glass airspace disease throughout the dependent right lung, concerning for infection or aspiration."   PMH significant of ICD, HFrEF (EF ~20% for > 5 years), HLD and HTN. S/P LVAD HM-III 04/20/18. Respiratory Status: Nasal cannula History of Recent Intubation: No Behavior/Cognition: Alert;Cooperative;Pleasant mood Oral Cavity Assessment: Within Functional  Limits Oral Care Completed by SLP: No Oral Cavity - Dentition: Adequate natural dentition Vision: Functional for self-feeding Self-Feeding Abilities: Able to feed self Patient Positioning: Upright in bed Baseline Vocal Quality: Normal Volitional Swallow: Able to elicit    Oral/Motor/Sensory Function Overall Oral Motor/Sensory Function: Within functional limits   Ice Chips Ice chips: Within functional limits Presentation: Self Fed;Spoon   Thin Liquid Thin Liquid: Within functional limits Presentation: Cup;Self Fed    Nectar Thick Nectar Thick Liquid: Not tested   Honey Thick Honey Thick Liquid: Not tested   Puree Puree: Within functional limits Presentation: Self Fed;Spoon   Solid     Solid: Within functional limits Presentation: Self Fed      Aurelia Leeks 08/10/2023,10:34 AM

## 2023-08-10 NOTE — Plan of Care (Signed)
  Problem: Education: Goal: Patient will understand all VAD equipment and how it functions Outcome: Progressing Goal: Patient will be able to verbalize current INR target range and antiplatelet therapy for discharge home Outcome: Progressing   Problem: Cardiac: Goal: LVAD will function as expected and patient will experience no clinical alarms Outcome: Progressing   Problem: Education: Goal: Knowledge of General Education information will improve Description: Including pain rating scale, medication(s)/side effects and non-pharmacologic comfort measures Outcome: Progressing   Problem: Health Behavior/Discharge Planning: Goal: Ability to manage health-related needs will improve Outcome: Progressing   Problem: Clinical Measurements: Goal: Ability to maintain clinical measurements within normal limits will improve Outcome: Progressing Goal: Will remain free from infection Outcome: Progressing Goal: Diagnostic test results will improve Outcome: Progressing Goal: Respiratory complications will improve Outcome: Progressing Goal: Cardiovascular complication will be avoided Outcome: Progressing   Problem: Activity: Goal: Risk for activity intolerance will decrease Outcome: Progressing   Problem: Nutrition: Goal: Adequate nutrition will be maintained Outcome: Progressing   Problem: Coping: Goal: Level of anxiety will decrease Outcome: Progressing   Problem: Elimination: Goal: Will not experience complications related to bowel motility Outcome: Progressing Goal: Will not experience complications related to urinary retention Outcome: Progressing   Problem: Pain Managment: Goal: General experience of comfort will improve and/or be controlled Outcome: Progressing   Problem: Safety: Goal: Ability to remain free from injury will improve Outcome: Progressing   Problem: Skin Integrity: Goal: Risk for impaired skin integrity will decrease Outcome: Progressing   Problem:  Education: Goal: Understanding of CV disease, CV risk reduction, and recovery process will improve Outcome: Progressing Goal: Individualized Educational Video(s) Outcome: Progressing   Problem: Activity: Goal: Ability to return to baseline activity level will improve Outcome: Progressing   Problem: Cardiovascular: Goal: Ability to achieve and maintain adequate cardiovascular perfusion will improve Outcome: Progressing Goal: Vascular access site(s) Level 0-1 will be maintained Outcome: Progressing

## 2023-08-10 NOTE — Evaluation (Signed)
 Modified Barium Swallow Study  Patient Details  Name: David David MRN: [id number] Date of Birth: June 07, 1965  Today's Date: 08/10/2023  Modified Barium Swallow completed.  Full report located under Chart Review in the Imaging Section.  History of Present Illness 58 y.o. male presented 4/10 with acute hypoxic respiratory failure. CXR done, which showed pulmonary edema. He was given IV lasix and started on IV azithromycin and rochephin for suspected pneumonia. Recent CT chest from 04/15 displayed "Heterogeneous and ground-glass airspace disease throughout the dependent right lung, concerning for infection or aspiration."   PMH significant of ICD, HFrEF (EF ~20% for > 5 years), HLD and HTN. S/P LVAD HM-III 04/20/18.   Clinical Impression Pt's swallow is grossly WFL. No oral phase deficits were noted. Pharyngeal swallow appears safe and efficient for swallowing across every consistency tested. Trace retention and retrograde flow was noted in mid-distal of esophagus during esophageal sweep. Recommend no changes to current diet and pt does not need further acute SLP services.  Factors that may increase risk of adverse event in presence of aspiration David David & David David 2021): Respiratory or GI disease  Swallow Evaluation Recommendations Recommendations: PO diet PO Diet Recommendation: Regular;Thin liquids (Level 0) Liquid Administration via: Cup;Straw Medication Administration: Whole meds with liquid Supervision: Patient able to self-feed Oral care recommendations: Oral care BID (2x/day)      David David 08/10/2023,2:22 PM

## 2023-08-10 NOTE — TOC Progression Note (Addendum)
 Transition of Care [facility]) - Progression Note    Patient Details  Name: David [name] MRN: [id number] Date of Birth: 10/07/1965  Transition of Care [facility]) CM/SW Contact  Benjiman Bras, RN Phone Number: [phone] 08/10/2023, 12:29 PM  Clinical Narrative:     Spoke to pt and wife at bedside. Offered choice for [facility] and DME, medicare.gov list with ratings given to wife. Wife agreeable to [facility] for [facility] PT. Will need HH PT orders with F2F. Message sent to attending.   TOC CM contacted [facility] for home oxygen.   Expected Discharge Plan: Home/Self Care Barriers to Discharge: Continued Medical Work up  Expected Discharge Plan and Services   Discharge Planning Services: CM Consult   Living arrangements for the past 2 months: Apartment                 DME Arranged: Oxygen DME Agency: [facility] Date DME Agency Contacted: 08/10/23 Time DME Agency Contacted: 1229 Representative spoke with at DME Agency: Marlou Sims             Social Determinants of Health (SDOH) Interventions SDOH Screenings   Food Insecurity: No Food Insecurity (08/03/2023)  Recent Concern: Food Insecurity - Food Insecurity Present (05/24/2023)  Housing: Low Risk  (08/03/2023)  Transportation Needs: No Transportation Needs (08/03/2023)  Utilities: Not At Risk (08/03/2023)  Recent Concern: Utilities - At Risk (05/24/2023)  Depression (PHQ2-9): High Risk (07/19/2021)  Financial Resource Strain: High Risk (07/04/2022)  Physical Activity: Inactive (03/10/2017)  Stress: No Stress Concern Present (03/10/2017)  Tobacco Use: Medium Risk (08/03/2023)    Readmission Risk Interventions     No data to display

## 2023-08-11 ENCOUNTER — Other Ambulatory Visit (HOSPITAL_COMMUNITY): Payer: Self-pay

## 2023-08-11 ENCOUNTER — Encounter (HOSPITAL_COMMUNITY): Payer: Self-pay | Admitting: Adult Health

## 2023-08-11 DIAGNOSIS — J9601 Acute respiratory failure with hypoxia: Secondary | ICD-10-CM | POA: Diagnosis not present

## 2023-08-11 LAB — BASIC METABOLIC PANEL WITH GFR
Anion gap: 9 (ref 5–15)
BUN: 24 mg/dL — ABNORMAL HIGH (ref 6–20)
CO2: 27 mmol/L (ref 22–32)
Calcium: 9.4 mg/dL (ref 8.9–10.3)
Chloride: 101 mmol/L (ref 98–111)
Creatinine, Ser: 1.48 mg/dL — ABNORMAL HIGH (ref 0.61–1.24)
GFR, Estimated: 54 mL/min — ABNORMAL LOW (ref >60)
Glucose, Bld: 97 mg/dL (ref 70–99)
Potassium: 4 mmol/L (ref 3.5–5.1)
Sodium: 137 mmol/L (ref 135–145)

## 2023-08-11 LAB — LACTATE DEHYDROGENASE: LDH: 184 U/L (ref 98–192)

## 2023-08-11 LAB — CBC
HCT: 37.7 % — ABNORMAL LOW (ref 39.0–52.0)
Hemoglobin: 12.1 g/dL — ABNORMAL LOW (ref 13.0–17.0)
MCH: 25.4 pg — ABNORMAL LOW (ref 26.0–34.0)
MCHC: 32.1 g/dL (ref 30.0–36.0)
MCV: 79 fL — ABNORMAL LOW (ref 80.0–100.0)
Platelets: 290 10*3/uL (ref 150–400)
RBC: 4.77 MIL/uL (ref 4.22–5.81)
RDW: 15.6 % — ABNORMAL HIGH (ref 11.5–15.5)
WBC: 14.6 10*3/uL — ABNORMAL HIGH (ref 4.0–10.5)
nRBC: 0 % (ref 0.0–0.2)

## 2023-08-11 LAB — PROTIME-INR
INR: 1.7 — ABNORMAL HIGH (ref 0.8–1.2)
Prothrombin Time: 19.8 s — ABNORMAL HIGH (ref 11.4–15.2)

## 2023-08-11 LAB — MAGNESIUM: Magnesium: 1.8 mg/dL (ref 1.7–2.4)

## 2023-08-11 MED ORDER — WARFARIN SODIUM 1 MG PO TABS
1.0000 mg | ORAL_TABLET | Freq: Every day | ORAL | Status: DC
  Filled 2023-08-11: qty 1

## 2023-08-11 MED ORDER — TORSEMIDE 20 MG PO TABS
20.0000 mg | ORAL_TABLET | Freq: Every day | ORAL | 6 refills | Status: DC
  Filled 2023-08-11: qty 30, 30d supply, fill #0

## 2023-08-11 MED ORDER — TORSEMIDE 20 MG PO TABS
20.0000 mg | ORAL_TABLET | Freq: Every day | ORAL | Status: DC
  Administered 2023-08-11: 20 mg via ORAL
  Filled 2023-08-11: qty 1

## 2023-08-11 MED ORDER — WARFARIN SODIUM 2 MG PO TABS: ORAL_TABLET | ORAL | 6 refills | Status: DC

## 2023-08-11 NOTE — Progress Notes (Signed)
 Mobility Specialist Progress Note;   08/11/23 0928  Mobility  Activity Ambulated with assistance in hallway  Level of Assistance Standby assist, set-up cues, supervision of patient - no hands on  Assistive Device None  Distance Ambulated (ft) 2000 ft  Activity Response Tolerated well  Mobility Referral Yes  Mobility visit 1 Mobility  Mobility Specialist Start Time (ACUTE ONLY) [id number]  Mobility Specialist Stop Time (ACUTE ONLY) 1004  Mobility Specialist Time Calculation (min) (ACUTE ONLY) 36 min   Pt eager for mobility. Required no physical assistance during ambulation, SV. Independent with LVAD equipment. Ambulated on 2LO2. SPO2 88%> when able to receive accurate pleth. Pt states he feels great and has no SOB. Pt requested to sit in chair at EOS. Pt left in chair with all needs met. RN notified.   Lauraine Erm Mobility Specialist Please contact via SecureChat or [employer] [phone]

## 2023-08-11 NOTE — Progress Notes (Signed)
 PHARMACY - ANTICOAGULATION CONSULT NOTE  Pharmacy Consult for warfarin Indication:  LVAD  Allergies  Allergen Reactions   Plavix [Clopidogrel Bisulfate] Hives    Patient Measurements: Height: 5\' 5"  (165.1 cm) Weight: 67.6 kg (149 lb 0.5 oz) IBW/kg (Calculated) : 61.5 HEPARIN  DW (KG): 74.4  Vital Signs: Temp: 97.6 F (36.4 C) (04/18 0815) Temp Source: Oral (04/18 0815) BP: 91/66 (04/18 0815) Pulse Rate: 74 (04/18 0815)  Labs: Recent Labs    08/09/23 0221 08/09/23 0223 08/10/23 0645 08/11/23 0639  HGB  --   --  11.5* 12.1*  HCT  --   --  37.0* 37.7*  PLT  --   --  243 290  LABPROT  --  21.6* 18.5* 19.8*  INR  --  1.9* 1.5* 1.7*  CREATININE 1.62*  --  1.41* 1.48*    Estimated Creatinine Clearance: 46.7 mL/min (A) (by C-G formula based on SCr of 1.48 mg/dL (H)).   Medical History: Past Medical History:  Diagnosis Date   AICD (automatic cardioverter/defibrillator) present    Anemia    Anxiety    CAD (coronary artery disease) 2009   AMI in 12/2007 with PCI to LAD, staged PCI to  mid/distal RCA, NSTEMI in 02/2009 with BMS to LCx   CHF (congestive heart failure) (HCC)    Chronic kidney disease 11/03/2016   stage 3 kidney disease   Dysrhythmia    "arrythmia problems at some point", "fatal rhythms"   History of blood transfusion    "I was bleeding from was rectum" (04/19/2017)   HLD (hyperlipidemia)    HTN (hypertension)    Hyperlipidemia 03/15/2011   Ischemic cardiomyopathy    Admitted in 07/2010 with CHF exacerbation   Myocardial infarction (HCC)    "I've had 4" (04/19/2017)   Pneumonia 2018 X 1   Seizures (HCC)    one seizure in 04/2016 during cardiac event (04/19/2017)   STEMI 2009 (anterior), 2010 (lateral), and 2012 (inferior) 03/14/2011    Medications:  Scheduled:   amLODipine   5 mg Oral Daily   atorvastatin   40 mg Oral Daily   magnesium  oxide  400 mg Oral BID   pantoprazole   40 mg Oral Daily   polyethylene glycol  17 g Oral Daily   potassium  chloride  40 mEq Oral Daily   senna-docusate  1 tablet Oral QHS   sodium chloride  flush  3 mL Intravenous Q12H   tamsulosin   0.4 mg Oral Daily   traZODone   50 mg Oral QHS   warfarin  1 mg Oral q1600   Warfarin - Pharmacist Dosing Inpatient   Does not apply q1600    Assessment: 57 yom presenting with hypoxia. Hx of LVAD HM3 in 2019 - on warfarin PTA. Last dose on 4/8 - PTA regimen is 1 mg daily except 2 mg MWF.   INR 3.9 at admit > fell to 1.5 after holding warfarin.  CBC is stable with no signs of bleeding. LDH is up but stable. INR 1.7 at goal after warfarin 2mg  boost  Dc home on warfarin 1mg  daily - slightly lower than PTA dose   Goal of Therapy:  INR 1.5-2 Monitor platelets by anticoagulation protocol: Yes   Plan:  Warfarin 1mg  daily  Check INR next week with clinic    Hortensia Ma Pharm.D. CPP, BCPS Clinical Pharmacist [phone] 08/11/2023 9:13 AM   [facility] pharmacy phone numbers are listed on [email or web address]

## 2023-08-11 NOTE — Progress Notes (Signed)
 Nurse requested Mobility Specialist to perform oxygen saturation test with pt which includes removing pt from oxygen both at rest and while ambulating.  Below are the results from that testing.     Patient Saturations on Room Air at Rest = spO2 82%  Patient Saturations on 2 Liters of oxygen while Ambulating = sp02 88%> .  At end of testing pt left in room on 2  Liters of oxygen.  Reported results to nurse.   Janit Meline Mobility Specialist Please contact via SecureChat or [employer] [phone]

## 2023-08-11 NOTE — Progress Notes (Signed)
 SATURATION QUALIFICATIONS: (This note is used to comply with regulatory documentation for home oxygen)  Patient Saturations on Room Air at Rest = 82%  Patient Saturations on 2 Liters of oxygen while Ambulating = 88%  Please briefly explain why patient needs home oxygen: Patient unable to maintain adequate oxygen saturation without 2L

## 2023-08-11 NOTE — Progress Notes (Addendum)
 Advanced Heart Failure VAD Team Note  PCP-Cardiologist: Jules Oar, MD   Chief Complaint: Heart Failure/PNA  Subjective:   Yesterday diuresed with IV lasix . Negative 1.1 liters.   Feels ok. Denies SOB.   LVAD INTERROGATION:  HeartMate III LVAD:   Flow 5.2  liters/min, speed 5600, power 4 PI 3.1. No PI events.  Objective:    Vital Signs:   Temp:  [97.5 F (36.4 C)-98 F (36.7 C)] 98 F (36.7 C) (04/18 0500) Pulse Rate:  [62-90] 62 (04/18 0500) Resp:  [17-20] 19 (04/18 0500) BP: (100-112)/(67-91) 101/67 (04/18 0500) SpO2:  [95 %-99 %] 97 % (04/18 0500) Weight:  [67.6 kg] 67.6 kg (04/18 0531) Last BM Date : 08/10/23  Mean arterial Pressure 80s  Intake/Output:  Intake/Output Summary (Last 24 hours) at 08/11/2023 0707 Last data filed at 08/11/2023 0530 Gross per 24 hour  Intake 1560 ml  Output 2725 ml  Net -1165 ml    Physical Exam    Physical Exam: GENERAL: No acute distress. HEENT: normal  NECK: Supple, JVP  6-7.  2+ bilaterally, no bruits.  No lymphadenopathy or thyromegaly appreciated.   CARDIAC:  Mechanical heart sounds with LVAD hum present.  LUNGS:  Clear to auscultation bilaterally. On 2 liters at rest.  ABDOMEN:  Soft, round, nontender, positive bowel sounds x4.     LVAD exit site:  Dressing dry and intact.  No erythema or drainage.  Stabilization device present and accurately applied.  EXTREMITIES:  Warm and dry, no cyanosis, clubbing, rash or edema  NEUROLOGIC:  Alert and oriented x 3.    No aphasia.  No dysarthria.  Affect pleasant.      Telemetry   SR 80s  Labs   Basic Metabolic Panel: Recent Labs  Lab 08/06/23 0156 08/07/23 0211 08/08/23 0214 08/08/23 0818 08/09/23 0221 08/09/23 0223 08/10/23 0645  NA 138 137 138 141  140 137  --  136  K 3.7 4.2 3.9 4.0  4.2 4.6  --  4.5  CL 102 105 103  --  104  --  106  CO2 25 22 26   --  25  --  23  GLUCOSE 147* 94 92  --  89  --  101*  BUN 15 13 14   --  14  --  15  CREATININE 1.67* 1.41*  1.74*  --  1.62*  --  1.41*  CALCIUM  8.9 8.8* 9.0  --  8.8*  --  9.4  MG 1.9 2.0 1.8  --   --  1.8 1.8   Liver Function Tests: No results for input(s): "AST", "ALT", "ALKPHOS", "BILITOT", "PROT", "ALBUMIN " in the last 168 hours.  CBC: Recent Labs  Lab 08/05/23 0203 08/06/23 0156 08/07/23 0211 08/08/23 0214 08/08/23 0818 08/10/23 0645  WBC 9.0 7.6 6.9 7.6  --  13.1*  HGB 11.2* 11.3* 10.8* 11.4* 12.9*  12.9* 11.5*  HCT 34.9* 35.3* 34.4* 36.2* 38.0*  38.0* 37.0*  MCV 78.1* 79.0* 78.9* 78.4*  --  78.6*  PLT 197 202 206 235  --  243   INR: Recent Labs  Lab 08/06/23 0156 08/07/23 0211 08/08/23 0214 08/09/23 0223 08/10/23 0645  INR 3.0* 3.0* 2.4* 1.9* 1.5*   Imaging   No results found.   Medications:    Scheduled Medications:  amLODipine   5 mg Oral Daily   atorvastatin   40 mg Oral Daily   magnesium  oxide  400 mg Oral BID   pantoprazole   40 mg Oral Daily   polyethylene glycol  17  g Oral Daily   potassium chloride   40 mEq Oral Daily   senna-docusate  1 tablet Oral QHS   sodium chloride  flush  3 mL Intravenous Q12H   tamsulosin   0.4 mg Oral Daily   traZODone   50 mg Oral QHS   Warfarin - Pharmacist Dosing Inpatient   Does not apply q1600   Infusions:    PRN Medications: acetaminophen , acetaminophen , ondansetron  (ZOFRAN ) IV, sodium chloride  flush  Patient Profile   David [name] is a 58 y.o. male with history of ICD (09 PCI to LAD, mRCA, '10 PCI to Lcx and '12 mRCA), HFrEF (EF ~20% for > 5 years), HLD and HTN. S/P LVAD HM-III 04/20/18.   Admitted with acute hypoxic respiratory failure, possible PNA.   Assessment/Plan:    1. Acute Hypoxic Respiratory Failure: admitted earlier in the month with Flu A. ?PNA post viral infection. Also volume overloaded on exam on admit. CXR with pulm congestion. On [place].  - Azithro/rochepin switched to Augmentin  on 4/14 - Has been hypoxic despite adequate diuresis (see RHC). Now on 6L [place] to maintain O2 sats. Will need home  O2. - Hi-Res chest CT w/ GG airspace disease in right lung, pulmonary edema, ATX left base, emphysema.  -Swallow Study- negative. Continue PPI - ESR just mildly elevated and no evidence of lung toxicity on CT.  No steroids. Continue to hold amiodarone  for now.  Unable to obtain small oxygen tank if heeds 6 liters. Will ambulate this morning to verify what he will need.    2 . Chronic systolic CHF with HM III LVAD implant 04/20/17 - RHC 04/15: RA 6, PA mean 35, PCWP 19, Fick CI 3.1, TD CI 3.0 - Volume status improved with IV lasix . Switch to torsemide  20 mg po daily .  - Renal function stable.   3. VAD management - VAD interrogated personally. Parameters stable. Ramp RHC on 04/15. Speed increased to 5600.  - INR 1.7 (INR goal 1.5-2). Warfarin dosing per PharmD.] LDH stable.     4. CKD3: basleine 1.5-1.8 - sCr 1.5 today, stable.    5. Hx of Ventricular tachycardia - Had ICD shock x 2 for VT on 11/24 (device reprogrammed at the time) - S/p fracture lead removal and ICD exchange 1/25 -Amio stopped   6. CAD:  s/p PCI RCA/PLOM with DES x2 and DES to mild LAD 1/18 -No chest pain.  - Continue statin. Off ASA due to GIB   7. Urinary retention: reports intermittent dysuria in the past week - Continue Flomax  0.4 mg daily   8. HTN: - continue norvasc  5 mg  -Stable.   9. Microcytic anemia - received 200 mg IV Venofer  in 03/25.  - Iron  studies rechecked this admit, improving  I reviewed the LVAD parameters from today, and compared the results to the patient's prior recorded data.  No programming changes were made.  The LVAD is functioning within specified parameters.  The patient performs LVAD self-test daily.  LVAD interrogation was negative for any significant power changes, alarms or PI events/speed drops.  LVAD equipment check completed and is in good working order.  Back-up equipment present.   LVAD education done on emergency procedures and precautions and reviewed exit site  care.  Length of Stay: 8  Amy Clegg, NP 08/11/2023, 7:07 AM  VAD Team --- VAD ISSUES ONLY--- Pager [phone] (7am - 7am)  Advanced Heart Failure Team  Pager [phone] (M-F; 7a - 5p)  Please contact [facility] for night-coverage after hours (5p -7a )  and weekends on [email or web address]  Patient seen and examined with the above-signed Advanced Practice Provider and/or Housestaff. I personally reviewed laboratory data, imaging studies and relevant notes. I independently examined the patient and formulated the important aspects of the plan. I have edited the note to reflect any of my changes or salient points. I have personally discussed the plan with the patient and/or family.  Diuresed well overnight. Breathing better. Still requiring oxygen  General:  NAD.  HEENT: normal  Neck: supple. JVP not elevated.  Carotids 2+ bilat; no bruits. No lymphadenopathy or thryomegaly appreciated. Cor: LVAD hum.  Lungs: coarse Abdomen: obese soft, nontender, non-distended. No hepatosplenomegaly. No bruits or masses. Good bowel sounds. Driveline site clean. Anchor in place.  Extremities: no cyanosis, clubbing, rash. Warm no edema  Neuro: alert & oriented x 3. No focal deficits. Moves all 4 without problem   Overall improved. Likely stable for d/c home today with home O2.(Wants small portable tanke). Place on torsemide  20 daily. Continue to hold amio.   VAD interrogated personally. Parameters stable.  INR 1.7 Discussed warfarin dosing with PharmD personally.  Jules Oar, MD  9:30 AM

## 2023-08-11 NOTE — Plan of Care (Signed)
  Problem: Education: Goal: Patient will understand all VAD equipment and how it functions Outcome: Adequate for Discharge Goal: Patient will be able to verbalize current INR target range and antiplatelet therapy for discharge home Outcome: Adequate for Discharge   Problem: Cardiac: Goal: LVAD will function as expected and patient will experience no clinical alarms Outcome: Adequate for Discharge   Problem: Education: Goal: Knowledge of General Education information will improve Description: Including pain rating scale, medication(s)/side effects and non-pharmacologic comfort measures Outcome: Adequate for Discharge   Problem: Health Behavior/Discharge Planning: Goal: Ability to manage health-related needs will improve Outcome: Adequate for Discharge   Problem: Clinical Measurements: Goal: Ability to maintain clinical measurements within normal limits will improve Outcome: Adequate for Discharge Goal: Will remain free from infection Outcome: Adequate for Discharge Goal: Diagnostic test results will improve Outcome: Adequate for Discharge Goal: Respiratory complications will improve Outcome: Adequate for Discharge Goal: Cardiovascular complication will be avoided Outcome: Adequate for Discharge   Problem: Activity: Goal: Risk for activity intolerance will decrease Outcome: Adequate for Discharge   Problem: Nutrition: Goal: Adequate nutrition will be maintained Outcome: Adequate for Discharge   Problem: Coping: Goal: Level of anxiety will decrease Outcome: Adequate for Discharge   Problem: Elimination: Goal: Will not experience complications related to bowel motility Outcome: Adequate for Discharge Goal: Will not experience complications related to urinary retention Outcome: Adequate for Discharge   Problem: Pain Managment: Goal: General experience of comfort will improve and/or be controlled Outcome: Adequate for Discharge   Problem: Safety: Goal: Ability to  remain free from injury will improve Outcome: Adequate for Discharge   Problem: Skin Integrity: Goal: Risk for impaired skin integrity will decrease Outcome: Adequate for Discharge   Problem: Education: Goal: Understanding of CV disease, CV risk reduction, and recovery process will improve Outcome: Adequate for Discharge Goal: Individualized Educational Video(s) Outcome: Adequate for Discharge   Problem: Activity: Goal: Ability to return to baseline activity level will improve Outcome: Adequate for Discharge   Problem: Cardiovascular: Goal: Ability to achieve and maintain adequate cardiovascular perfusion will improve Outcome: Adequate for Discharge Goal: Vascular access site(s) Level 0-1 will be maintained Outcome: Adequate for Discharge   Problem: Health Behavior/Discharge Planning: Goal: Ability to safely manage health-related needs after discharge will improve Outcome: Adequate for Discharge

## 2023-08-11 NOTE — Progress Notes (Signed)
 LVAD Coordinator Rounding Note:  Admitted 08/04/23 following a hospital follow up in VAD Clinic. Pt reported increased bloating and SOB since Tuesday. SPO2 79% on RA. Pt placed on 3L [place] in VAD Clinic and admitted to [facility].   HM3 LVAD implanted on 04/20/17 by Dr Sherene Dilling under destination therapy criteria.  Pt sitting up in the chair. States he is feeling much better. Denies shortness of breath currently. But pts oxygen requirement is down to 2L/[place] of oxygen.   Transitioned to Augmentin .   INR 1.7 today.  Speed increased to 5600 during RHC. Pt had CT high res to assess lungs.  Vital signs: Temp: 97.6 HR: 78 Doppler Pressure: 92 Automatic BP: 91/66 (76) O2 Sat: 96% on 2L/[place] Wt: 169.7>165>153.9>154.5>153.6>149 lbs    LVAD interrogation :  Speed: 5600 Flow: 5 Power: 4.3 w PI: 3.4   Alarms: none Events:  none Hematocrit: 37  Fixed speed: 5600 Low speed limit: 5300  Drive Line: Existing dressing clean, dry, and intact. Anchor correctly applied. Weekly dressing maintained by pt's wife at home. Weekly dressing changes using weekly kit by wife Tarri Farm. Next dressing change due: 08/14/23.   Labs:  LDH trend: 169>179>169>200>175>184  INR trend: 3.5>3.0>2.4>1.9>1.5>1.7  Cr trend: 1.66>1.41>1.74>1.62>1.41>1.48  WBC trend: 8.5>6.9>7.6>13.1>14.6  Anticoagulation Plan: -INR Goal: 1.5-2.0 -ASA Dose: none - Coumadin : ongoing  Device: -Medtronic BiV -Therapies: ON  Arrythmias: Hx Afib/AF- on Amiodarone  200 mg daily- stopped   Plan/Recommendations:  1. Page VAD coordinator with any drive line or equipment concerns 2. Weekly drive line dressing changes by bedside RN 3. Pt is discharging home with O2. Has f/u with Dr Julane Ny Wednesday 4/23 @ 1000am.  Adams Adams RN VAD Coordinator  Office: [phone]  24/7 Pager: [phone]

## 2023-08-14 ENCOUNTER — Ambulatory Visit (HOSPITAL_COMMUNITY): Payer: Self-pay | Admitting: Pharmacist

## 2023-08-14 LAB — POCT INR: INR: 1.8 — AB (ref 2.0–3.0)

## 2023-08-15 ENCOUNTER — Other Ambulatory Visit (HOSPITAL_COMMUNITY): Payer: Self-pay | Admitting: *Deleted

## 2023-08-15 DIAGNOSIS — I5022 Chronic systolic (congestive) heart failure: Secondary | ICD-10-CM

## 2023-08-15 DIAGNOSIS — Z7901 Long term (current) use of anticoagulants: Secondary | ICD-10-CM

## 2023-08-15 DIAGNOSIS — Z95811 Presence of heart assist device: Secondary | ICD-10-CM

## 2023-08-16 ENCOUNTER — Ambulatory Visit (HOSPITAL_COMMUNITY)
Admit: 2023-08-16 | Discharge: 2023-08-16 | Disposition: A | Discharge status: Home / Self Care | Attending: Internal Medicine | Admitting: Internal Medicine

## 2023-08-16 VITALS — BP 94/76 | HR 71 | Wt 163.2 lb

## 2023-08-16 DIAGNOSIS — E785 Hyperlipidemia, unspecified: Secondary | ICD-10-CM | POA: Insufficient documentation

## 2023-08-16 DIAGNOSIS — Z95810 Presence of automatic (implantable) cardiac defibrillator: Secondary | ICD-10-CM | POA: Insufficient documentation

## 2023-08-16 DIAGNOSIS — Z7901 Long term (current) use of anticoagulants: Secondary | ICD-10-CM | POA: Diagnosis not present

## 2023-08-16 DIAGNOSIS — I2510 Atherosclerotic heart disease of native coronary artery without angina pectoris: Secondary | ICD-10-CM

## 2023-08-16 DIAGNOSIS — I4892 Unspecified atrial flutter: Secondary | ICD-10-CM | POA: Insufficient documentation

## 2023-08-16 DIAGNOSIS — I609 Nontraumatic subarachnoid hemorrhage, unspecified: Secondary | ICD-10-CM | POA: Diagnosis not present

## 2023-08-16 DIAGNOSIS — I480 Paroxysmal atrial fibrillation: Secondary | ICD-10-CM | POA: Diagnosis not present

## 2023-08-16 DIAGNOSIS — Z4509 Encounter for adjustment and management of other cardiac device: Secondary | ICD-10-CM | POA: Diagnosis not present

## 2023-08-16 DIAGNOSIS — I130 Hypertensive heart and chronic kidney disease with heart failure and stage 1 through stage 4 chronic kidney disease, or unspecified chronic kidney disease: Secondary | ICD-10-CM | POA: Insufficient documentation

## 2023-08-16 DIAGNOSIS — Z95811 Presence of heart assist device: Secondary | ICD-10-CM | POA: Insufficient documentation

## 2023-08-16 DIAGNOSIS — N1831 Chronic kidney disease, stage 3a: Secondary | ICD-10-CM | POA: Diagnosis not present

## 2023-08-16 DIAGNOSIS — I472 Ventricular tachycardia, unspecified: Secondary | ICD-10-CM | POA: Insufficient documentation

## 2023-08-16 DIAGNOSIS — J9601 Acute respiratory failure with hypoxia: Secondary | ICD-10-CM | POA: Insufficient documentation

## 2023-08-16 DIAGNOSIS — I5022 Chronic systolic (congestive) heart failure: Secondary | ICD-10-CM | POA: Diagnosis not present

## 2023-08-16 DIAGNOSIS — Z955 Presence of coronary angioplasty implant and graft: Secondary | ICD-10-CM | POA: Insufficient documentation

## 2023-08-16 LAB — BASIC METABOLIC PANEL WITH GFR
Anion gap: 7 (ref 5–15)
BUN: 13 mg/dL (ref 6–20)
CO2: 31 mmol/L (ref 22–32)
Calcium: 9.1 mg/dL (ref 8.9–10.3)
Chloride: 102 mmol/L (ref 98–111)
Creatinine, Ser: 1.53 mg/dL — ABNORMAL HIGH (ref 0.61–1.24)
GFR, Estimated: 52 mL/min — ABNORMAL LOW (ref >60)
Glucose, Bld: 94 mg/dL (ref 70–99)
Potassium: 4.1 mmol/L (ref 3.5–5.1)
Sodium: 140 mmol/L (ref 135–145)

## 2023-08-16 LAB — LACTATE DEHYDROGENASE: LDH: 201 U/L — ABNORMAL HIGH (ref 98–192)

## 2023-08-16 LAB — PROTIME-INR
INR: 1.5 — ABNORMAL HIGH (ref 0.8–1.2)
Prothrombin Time: 18.5 s — ABNORMAL HIGH (ref 11.4–15.2)

## 2023-08-16 LAB — CBC
HCT: 41.5 % (ref 39.0–52.0)
Hemoglobin: 12.6 g/dL — ABNORMAL LOW (ref 13.0–17.0)
MCH: 24.4 pg — ABNORMAL LOW (ref 26.0–34.0)
MCHC: 30.4 g/dL (ref 30.0–36.0)
MCV: 80.4 fL (ref 80.0–100.0)
Platelets: 259 10*3/uL (ref 150–400)
RBC: 5.16 MIL/uL (ref 4.22–5.81)
RDW: 15.7 % — ABNORMAL HIGH (ref 11.5–15.5)
WBC: 11.7 10*3/uL — ABNORMAL HIGH (ref 4.0–10.5)
nRBC: 0 % (ref 0.0–0.2)

## 2023-08-16 LAB — MAGNESIUM: Magnesium: 1.8 mg/dL (ref 1.7–2.4)

## 2023-08-16 NOTE — Progress Notes (Addendum)
 Patient presents for hosp f/u in VAD Clinic today with Tarri Farm) wife. Denies issues with VAD equipment or drive line.   Pt walked into clinic independently. He is wearing 4 L O2. States he is feeling much better. Denies lightheadedness dizziness, shortness of breath, heart failure symptoms, and signs of bleeding.   Pt asking when he can be done with O2. He is using his IS several times a dayat home- pulling 1500 cc. Weaned O2 today as documented below. Walked pt on RA- sats 88-89%. Discussed with Dr Julane Ny. May trial off O2 if maintains O2 sat 90% or greater. Instructed pt to wear O2 to maintain sat 90% or greater. Instructed to notify VAD coordinator if O2 requirement increases. They verbalized understanding.   [facility] is providing O2 supplies. Office: [phone] Fax: [phone]. Spoke with Tonya at [place] re: updated O2 order. Faxed updated O2 order to the above fax at her request.   Dr Julane Ny discussed recent CT scan results. Will plan to see pt in 1 month. At that appt will work on scheduling repeat CT scan per Dr Julane Ny.   Vital Signs: Temp:  Doppler Pressure: 90 Automatic BP: 94/76 (84) HR: 71 NSR  SPO2: 99% on 4L/[place] >> decreased O2 to 2L- 95% >> trialed off O2- 92-93%.    Weight: 163.2 lb w/ eqt Last weight: 149 lb w/o eqt-- hospital discharge  VAD Indication: Destination therapy- CKD   VAD interrogation & Equipment Management: Speed: 5600 Flow: 5.3 Power: 4.4 w    PI: 2.6   Alarms: none Events: rare  Fixed speed 5600 Low speed limit: 5300   Primary Controller:  Expired. Replaced with [id number] Manufacture: 05/29/23 Expiration: 04/27/25. Replace back up battery in 34 months Back up controller: Replace back up battery in 30 months   Annual Equipment Maintenance on UBC/PM was performed on 03/27/2023.   I reviewed the LVAD parameters from today and compared the results to the patient's prior recorded data. LVAD interrogation was NEGATIVE for significant  power changes, negative for clinical alarms and STABLE for PI events/speed drops. No programming changes were made and pump is functioning within specified parameters. Pt is performing daily controller and system monitor self tests along with completing weekly and monthly maintenance for LVAD equipment.   LVAD equipment check completed and is in good working order. Back up equipment present at this appointment.  Exit Site Care: Drive line is being maintained weekly by Tarri Farm, his wife. Exit site healed and incorporated, the velour is fully implanted at exit site. No redness, drainage, rash, or foul odor noted. Pt denies fever or chills. Pt given 8 weekly dressing kits and a box of adhesive remover for home use.  Device: Medtronic Bi-V Therapies: off Pacing: DDD 60 Last check: 06/09/23  BP & Labs:  Doppler BP 90 - Doppler is reflecting modified systolic  Hgb 12.6 - No S/S of bleeding. Specifically denies melena/BRBPR or nosebleeds.  LDH 201 with established baseline of 165- 250. Denies tea-colored urine. No power elevations noted on interrogation.    Patient Instructions:  No medication changes May decrease O2 to 2L. Use portable O2 to maintain O2 sat 90% or greater Coumadin  dosing per Lauren PharmD Return to clinic in 1 month  Paulo Bosworth RN VAD Coordinator  Office: [phone]  24/7 Pager: [phone]

## 2023-08-16 NOTE — Patient Instructions (Signed)
 No medication changes May decrease O2 to 2L. Use portable O2 to maintain O2 sat 90% or greater Coumadin  dosing per Lauren PharmD Return to clinic in 1 month

## 2023-08-20 NOTE — Progress Notes (Signed)
 LVAD Clinic Follow Up Note HF MD: Dr Julane Ny  HPI: David [name] is a 58 y.o. male with history of ICD (09 PCI to LAD, mRCA, '10 PCI to Lcx and '12 mRCA), HFrEF (EF ~20% for > 5 years), HLD and HTN. S/P LVAD HM-III 04/20/18.    Admitted 04/19/17 - 05/27/2017 for scheduled LVAD implant. On 12/27 he underwent HMIII LVAD implant. Hospital course c/b PNA shock liver and AKI. Required short term HD.  Readmitted 05/30/17 hgb 3.8. INR 2.9.transfused 4u pRBCs. 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 Underwent ventriculostomy. Admission c/b by periods of confusion and urinary retention. Discharged to CIR  Was seen for acute visit on 03/27/23 for ICD firing x 2 on Thanksgiving.. Electrolytes ok. Seen by Dr. Rodolfo Clan and ICD reprogrammed   Admitted on 07/10/23 for respiratory distress in setting of Influenza A infection.  Readmitted with 08/03/23 with recurrent respiratory distress and hypoxia. Felt to be combination of HF and possible residual infection. Diuresed. Underwent RHC which showed well optimized hemodynamics. VAD speed increased to 5600. Had persistent hypoxia despite abx and diuresis. Concern for amio toxicity but ESR was low. Hi-res CT had airspace disease and atx. Also increased mediastinal lymphadenopathy, Seen by Dr. Felipe Horton in Pulmonary. Recommended pulmonary toilet and home O2  Here for f/u with his wife. Feeling much better. Breathing much improved and O2 requirement decreasing. No edema, orthopnea or PND. No fevers, chills or problems with driveline. No bleeding, melena or neuro symptoms. No VAD alarms. Taking all meds as prescribed.    VAD Indication: Destination therapy- CKD   VAD interrogation & Equipment Management: Speed: 5600 Flow: 5.3 Power: 4.4 w    PI: 2.6   Alarms: none Events: rare  Fixed speed 5600 Low speed limit: 5300    Primary Controller:  Expired. Replaced with [id number] Manufacture: 05/29/23 Expiration: 04/27/25. Replace back up battery in 34 months Back up controller: Replace back up battery in 30 months   Annual Equipment Maintenance on UBC/PM was performed on 03/27/2023   I reviewed the LVAD parameters from today and compared the results to the patient's prior recorded data. LVAD interrogation was NEGATIVE for significant power changes, negative for clinical alarms and STABLE for PI events/speed drops. No programming changes were made and pump is functioning within specified parameters. Pt is performing daily controller and system monitor self tests along with completing weekly and monthly maintenance for LVAD equipment.   LVAD equipment check completed and is in good working order. Back up equipment present at this appointment.    Past Medical History:  Diagnosis Date   AICD (automatic cardioverter/defibrillator) present    Anemia    Anxiety    CAD (coronary artery disease) 2009   AMI in 12/2007 with PCI to LAD, staged PCI to  mid/distal RCA, NSTEMI in 02/2009 with BMS to LCx   CHF (congestive heart failure) (HCC)    Chronic kidney disease 11/03/2016   stage 3 kidney disease   Dysrhythmia    "arrythmia problems at some point", "fatal rhythms"   History of blood transfusion    "I was bleeding from was rectum" (04/19/2017)   HLD (hyperlipidemia)    HTN (hypertension)    Hyperlipidemia 03/15/2011   Ischemic cardiomyopathy    Admitted in 07/2010 with CHF exacerbation   Myocardial infarction (HCC)    "I've had 4" (04/19/2017)  Pneumonia 2018 X 1   Seizures (HCC)    one seizure in 04/2016 during cardiac event (04/19/2017)   STEMI 2009 (anterior), 2010 (lateral), and 2012 (inferior) 03/14/2011    Current Outpatient Medications  Medication Sig Dispense Refill   acetaminophen  (TYLENOL ) 500 MG tablet Take 1,000 mg by mouth 2 (two) times daily as needed for moderate pain (pain score 4-6), fever or  headache.     amLODipine  (NORVASC ) 5 MG tablet Take 1 tablet (5 mg total) by mouth daily. 90 tablet 3   atorvastatin  (LIPITOR) 40 MG tablet Take 1 tablet (40 mg total) by mouth daily. 90 tablet 3   fluticasone  (FLONASE ) 50 MCG/ACT nasal spray Place 2 sprays into both nostrils daily. (Patient taking differently: Place 2 sprays into both nostrils daily as needed for allergies or rhinitis.) 9.9 mL 3   magnesium  oxide (MAG-OX) 400 (240 Mg) MG tablet Take 1 tablet (400 mg total) by mouth 2 (two) times daily. 180 tablet 3   Multiple Vitamins-Minerals (MENS 50+ MULTIVITAMIN) TABS Take 1 tablet by mouth daily.     pantoprazole  (PROTONIX ) 40 MG tablet Take 1 tablet (40 mg total) by mouth daily. 90 tablet 3   tamsulosin  (FLOMAX ) 0.4 MG CAPS capsule Take 1 capsule (0.4 mg total) by mouth daily. 90 capsule 3   torsemide  (DEMADEX ) 20 MG tablet Take 1 tablet (20 mg total) by mouth daily. 30 tablet 6   traZODone  (DESYREL ) 50 MG tablet Take 1 tablet (50 mg total) by mouth at bedtime. 90 tablet 3   warfarin (COUMADIN ) 2 MG tablet TAKE 1 MG daily per VAD Pharm D 30 tablet 6   No current facility-administered medications for this encounter.    Plavix [clopidogrel bisulfate]  Review of systems complete and found to be negative unless listed in HPI.       Wt Readings from Last 3 Encounters:  08/16/23 74 kg (163 lb 3.2 oz)  08/11/23 67.6 kg (149 lb 0.5 oz)  08/03/23 74.7 kg (164 lb 9.6 oz)    Vital Signs: Temp: AF Doppler Pressure: 90 Automatic BP: 84/76 (84) HR: 71 NSR  SPO2: 99% on 4L/[place] >> decreased O2 to 2L- 95% >> trialed off O2- 92-93%.    Weight: 163.2 lb w/ eqt Last weight: 149 lb w/o eqt-- hospital discharge    Physical Exam: General:  NAD.  HEENT: normal  Neck: supple. JVP not elevated.  Carotids 2+ bilat; no bruits. No lymphadenopathy or thryomegaly appreciated. Cor: LVAD hum.  Lungs: Clear. Abdomen:  soft, nontender, non-distended. No hepatosplenomegaly. No bruits or masses. Good  bowel sounds. Driveline site clean. Anchor in place.  Extremities: no cyanosis, clubbing, rash. Warm no edema  Neuro: alert & oriented x 3. No focal deficits. Moves all 4 without problem     ASSESSMENT AND PLAN:  1. Acute hypoxic respiratory failure - suspect post-infectious syndrome (flu A in 3/25) - Continue to wean O2 as able - Continue pulmonary toilet.  - Repeat CT 4-6 weeks  2. Chronic systolic CHF with HM III LVAD implant 04/20/17:  - NYHA II-III in setting of #1 - Volume status ok - Recent RHC reviewed  3. Ventricular tachycardia - Had ICD shock x 2 for VT on 03/23/23 (device reprogrammed at the time) - No recurrence. - Amio stopped 4/25 due to hypoxic resp failure (ESR was low). Can restart amio as needed   4. PAF/AFL - had DC-CV on 10/17/18 with subsequent short episodes of AF on amio 100 daily - Had recurrent AF  in 10/22. Amio increased to 200 bid and converted.  - Remains in NSR.  - Amio stopped 4/25 due to hypoxic resp failure (ESR was low). Can restart amio as needed   5. SAH - admit in 2/23 with [facility] requiring ventriculostomy - No significant residual deficits - Keep MAPs 70-80   6. h/o UGI AVM bleed - s/p APC x 4 lesions in 2/19 - No recurrence - Continue PPI - Off ASA due to GIB.   7. CKD Stage IIIa:  - Creatinine baseline 1.3 - 1.7  - SCr 1.55 today - Consider SGLT2i if volume status can handle  8. VAD management - VAD interrogated personally. Parameters stable. - LDH 201 - INR goal 1.5-2.0 with h/o SAH Off ASA   - INR  1.5 Discussed warfarin dosing with PharmD personally. - MAPs ok - DL ok  9. CAD:  s/p PCI RCA/PLOM with DES x2 and DES to mild LAD 1/18 - No s/s angina - Continue statin. Off ASA due to GIB  10. Hypomagnesemia. - Continue Mg supplementation  11. HTN - needs tight BP control with h/o SAH.  - MAPs ok  11. [employer] fan - unfortunately this has been incurable even with VAD implant  I spent 53 minutes caring for this  patient today including face to face time, ordering and reviewing labs, interrogating his device and LVAD, discussing case with Dr. Rodolfo Clan, reprogramming his ICD, lengthy discussion with wife regarding her concerns, seeing the patient, documenting in the record, and arranging follow ups.   Jules Oar, MD  9:13 PM

## 2023-08-21 ENCOUNTER — Ambulatory Visit (HOSPITAL_COMMUNITY): Payer: Self-pay | Admitting: Pharmacist

## 2023-08-21 LAB — POCT INR: INR: 1.6 — AB (ref 2.0–3.0)

## 2023-08-22 LAB — CUP PACEART REMOTE DEVICE CHECK
Battery Remaining Longevity: 147 mo
Battery Voltage: 3.13 V
Brady Statistic RV Percent Paced: 37.44 %
Date Time Interrogation Session: 20250429113428
HighPow Impedance: 54 Ohm
Implantable Lead Connection Status: 753985
Implantable Lead Connection Status: 753985
Implantable Lead Connection Status: 753985
Implantable Lead Connection Status: 753985
Implantable Lead Implant Date: 20180409
Implantable Lead Implant Date: 20180716
Implantable Lead Implant Date: 20180716
Implantable Lead Implant Date: 20250129
Implantable Lead Location: 753858
Implantable Lead Location: 753858
Implantable Lead Location: 753859
Implantable Lead Location: 753860
Implantable Lead Model: 5076
Implantable Lead Model: 511212
Implantable Lead Model: 511212
Implantable Lead Serial Number: 263773
Implantable Lead Serial Number: 263775
Implantable Pulse Generator Implant Date: 20250129
Lead Channel Impedance Value: 266 Ohm
Lead Channel Impedance Value: 304 Ohm
Lead Channel Impedance Value: 361 Ohm
Lead Channel Pacing Threshold Amplitude: 0.625 V
Lead Channel Pacing Threshold Amplitude: 0.75 V
Lead Channel Pacing Threshold Pulse Width: 0.4 ms
Lead Channel Pacing Threshold Pulse Width: 0.4 ms
Lead Channel Sensing Intrinsic Amplitude: 2.3 mV
Lead Channel Sensing Intrinsic Amplitude: 7.9 mV
Lead Channel Setting Pacing Amplitude: 1.5 V
Lead Channel Setting Pacing Amplitude: 3.5 V
Lead Channel Setting Pacing Pulse Width: 0.4 ms
Lead Channel Setting Sensing Sensitivity: 0.3 mV
Zone Setting Status: 755011
Zone Setting Status: 755011

## 2023-08-23 ENCOUNTER — Ambulatory Visit

## 2023-08-23 ENCOUNTER — Telehealth: Payer: Self-pay

## 2023-08-23 DIAGNOSIS — I255 Ischemic cardiomyopathy: Secondary | ICD-10-CM | POA: Diagnosis not present

## 2023-08-23 NOTE — Telephone Encounter (Signed)
 Alert received from [employer]:  Scheduled remote reviewed. Normal device function.  Presenting rhythm:  AF-VS, occasional VP.  Recent AF burden 100%, ongoing episode since 08/12/23.  Known history of AF, on warfarin per Epic.  3 VHR episodes consistent with NSVT, longest 9 beats.  HF diagnostics have been normal in this monitoring period. Routing to triage for increase in AF burden, currently persistent since 08/12/23.

## 2023-08-23 NOTE — Telephone Encounter (Signed)
 Per Dr Julane Ny, VAD coordinator spoke with pt who states he feels great. Pt is asymptomatic with afib. We have f/u with pt may 23. Pt was instructed to call the VAD office if he becomes SOB, has increased fatigue or begins to feel dizzy/lightheaded. Pt and wife verbalized understanding.  Adams Adams RN, BSN VAD Coordinator 24/7 Pager [phone]

## 2023-08-24 ENCOUNTER — Telehealth (HOSPITAL_COMMUNITY): Payer: Self-pay

## 2023-08-24 NOTE — Telephone Encounter (Signed)
 Patient's wife paged VAD Coordinator this evening stating he felt a small shock from his device. Patient states he feels great. Denies lightheadedness, dizziness, falls or shortness of breath. Patient state he would not of know his device shocked him other than "the lights were flashing". Patient had ICD shock 11/24 and device was reprogrammed at this time. Discussed previous symptoms during that time compared to tonight and patient states it was vastly different.   Dr. Alease Amend made aware. Will plan for nurse visit tomorrow with device interrogation. Patient advised to page VAD Coordinator if he has any recurrent shocks.  Laurice Pope RN, BSN VAD Coordinator 24/7 Pager [phone]

## 2023-08-25 ENCOUNTER — Ambulatory Visit: Payer: Medicare HMO | Admitting: Pulmonary Disease

## 2023-08-25 ENCOUNTER — Ambulatory Visit (HOSPITAL_COMMUNITY)
Admission: RE | Admit: 2023-08-25 | Discharge: 2023-08-25 | Disposition: A | Discharge status: Home / Self Care | Source: Ambulatory Visit | Attending: Internal Medicine

## 2023-08-25 VITALS — BP 112/84 | HR 87 | Wt 165.0 lb

## 2023-08-25 DIAGNOSIS — I480 Paroxysmal atrial fibrillation: Secondary | ICD-10-CM

## 2023-08-25 DIAGNOSIS — Z95811 Presence of heart assist device: Secondary | ICD-10-CM | POA: Diagnosis present

## 2023-08-25 DIAGNOSIS — I5022 Chronic systolic (congestive) heart failure: Secondary | ICD-10-CM

## 2023-08-25 LAB — BASIC METABOLIC PANEL WITH GFR
Anion gap: 9 (ref 5–15)
BUN: 12 mg/dL (ref 6–20)
CO2: 28 mmol/L (ref 22–32)
Calcium: 9.3 mg/dL (ref 8.9–10.3)
Chloride: 102 mmol/L (ref 98–111)
Creatinine, Ser: 1.6 mg/dL — ABNORMAL HIGH (ref 0.61–1.24)
GFR, Estimated: 49 mL/min — ABNORMAL LOW (ref >60)
Glucose, Bld: 84 mg/dL (ref 70–99)
Potassium: 3.4 mmol/L — ABNORMAL LOW (ref 3.5–5.1)
Sodium: 139 mmol/L (ref 135–145)

## 2023-08-25 LAB — PROTIME-INR
INR: 1.5 — ABNORMAL HIGH (ref 0.8–1.2)
Prothrombin Time: 18.6 s — ABNORMAL HIGH (ref 11.4–15.2)

## 2023-08-25 LAB — CBC
HCT: 38.4 % — ABNORMAL LOW (ref 39.0–52.0)
Hemoglobin: 12.1 g/dL — ABNORMAL LOW (ref 13.0–17.0)
MCH: 25.1 pg — ABNORMAL LOW (ref 26.0–34.0)
MCHC: 31.5 g/dL (ref 30.0–36.0)
MCV: 79.7 fL — ABNORMAL LOW (ref 80.0–100.0)
Platelets: 209 10*3/uL (ref 150–400)
RBC: 4.82 MIL/uL (ref 4.22–5.81)
RDW: 16.1 % — ABNORMAL HIGH (ref 11.5–15.5)
WBC: 12.4 10*3/uL — ABNORMAL HIGH (ref 4.0–10.5)
nRBC: 0 % (ref 0.0–0.2)

## 2023-08-25 LAB — LACTATE DEHYDROGENASE: LDH: 203 U/L — ABNORMAL HIGH (ref 98–192)

## 2023-08-25 LAB — MAGNESIUM: Magnesium: 1.7 mg/dL (ref 1.7–2.4)

## 2023-08-25 MED ORDER — AMIODARONE HCL 200 MG PO TABS: 200.0000 mg | ORAL_TABLET | Freq: Every day | ORAL | 3 refills | Status: DC

## 2023-08-25 MED ORDER — MAGNESIUM OXIDE -MG SUPPLEMENT 400 (240 MG) MG PO TABS: 400.0000 mg | ORAL_TABLET | Freq: Two times a day (BID) | ORAL | 3 refills | Status: AC

## 2023-08-25 MED ORDER — POTASSIUM CHLORIDE CRYS ER 20 MEQ PO TBCR: 20.0000 meq | EXTENDED_RELEASE_TABLET | Freq: Every day | ORAL | 3 refills | Status: AC

## 2023-08-25 NOTE — Telephone Encounter (Signed)
 Alert remote transmission:  Number of shocks delivered in an episode, AT/AF daily burden>threshold Event occurred 08/24/23 @ 17:13, 24sec in duration per deivce, HR 300.  EGM c/w AFL sustained VT, 1 burst of ATP followed by 40J of HV therapy converting back to AFL/VS.  Carealert reset Presenting AFL/VS, Warfarin Pt.'s wife contacted VAD coordinator, pt asymptomatic, nurse visit planned 5/2 Route to triage high alert per protocol Follow up as scheduled. LA, CVRS  See Phone note 5/1. Seeing HF clinic today. Looks like dueling tachycardia AFL VT that ramps up with ATP and successfully converts VT with defib. Reviewed w/ APP. Defer to HF today and sees AT next week.

## 2023-08-25 NOTE — Patient Instructions (Addendum)
 Start Amio 200 mg daily Start K 20 meq daily. Take 3 tablets today Coumadin  dosing per Lauren PharmD Return to clinic in 1 week

## 2023-08-25 NOTE — Progress Notes (Signed)
 Patient presents for nurse visit in VAD Clinic today with Tarri Farm) wife. Denies issues with VAD equipment or drive line.   Pt walked into clinic independently. He is wearing 2 L O2. States he is feeling good. Denies lightheadedness dizziness, shortness of breath, heart failure symptoms, and signs of bleeding.   Pt paged VAD coordinator last night reporting he felt like he may have been shocked by his device. States defibrillator "lights flashed" and he felt a small "thump" on his chest. Otherwise asymptomatic. EKG and Medtronic device interrogation completed in clinic today. Reviewed remote device report with Dr Julane Ny. "Event occurred 08/24/23 @ 17:13, 24sec in duration per deivce, HR 300. EGM c/w AFL sustained VT, 1 burst of ATP followed by 40J of HV therapy converting back to AFL/VS." See EP RN note for further details.   K 3.4 & Mag 1.7 today- Discussed with Dr Julane Ny. Order received for K 20 meq daily; to take 60 meq today. Order received to restart Amiodarone  200 mg daily. Prescriptions sent to pt's pharmacy. Called Tarri Farm and made aware of medication changes. She verbalized understanding.   Vital Signs: Doppler Pressure: not obtained today Automatic BP: 112/84 (90) HR: 87 aflutter SPO2: % on 2L    Weight: 165.0 lb w/ eqt Last weight: 163.2 lb w/o eqt-- hospital discharge  VAD Indication: Destination therapy- CKD   VAD interrogation & Equipment Management: Speed: 5600 Flow: 5.3 Power: 4.4 w    PI: 2.6   Alarms: none Events: rare  Fixed speed 5600 Low speed limit: 5300   Primary Controller: Replace back up battery in 34 months Back up controller: Replace back up battery in 30 months   Annual Equipment Maintenance on UBC/PM was performed on 03/27/2023.   I reviewed the LVAD parameters from today and compared the results to the patient's prior recorded data. LVAD interrogation was NEGATIVE for significant power changes, negative for clinical alarms and STABLE for PI  events/speed drops. No programming changes were made and pump is functioning within specified parameters. Pt is performing daily controller and system monitor self tests along with completing weekly and monthly maintenance for LVAD equipment.   LVAD equipment check completed and is in good working order. Back up equipment present at this appointment.  Exit Site Care: Drive line is being maintained weekly by Tarri Farm, his wife. Exit site healed and incorporated, the velour is fully implanted at exit site. No redness, drainage, rash, or foul odor noted. Pt denies fever or chills. Pt has adequate dressing supplies at home.   Device: Medtronic Bi-V Therapies: VF 250 VT 167-300 Pacing: DDD 60 Last check: 08/22/23  BP & Labs:  Doppler BP not obtained today  - Doppler is reflecting modified systolic  Hgb 12.1 - No S/S of bleeding. Specifically denies melena/BRBPR or nosebleeds.  LDH 203 with established baseline of 165- 250. Denies tea-colored urine. No power elevations noted on interrogation.    Patient Instructions:  Start Amio 200 mg daily Start K 20 meq daily. Take 3 tablets today Coumadin  dosing per Lauren PharmD Return to clinic in 1 week  Paulo Bosworth RN VAD Coordinator  Office: [phone]  24/7 Pager: [phone]

## 2023-08-27 ENCOUNTER — Encounter: Payer: Self-pay | Admitting: Cardiology

## 2023-08-28 ENCOUNTER — Ambulatory Visit (HOSPITAL_COMMUNITY): Payer: Self-pay | Admitting: Pharmacist

## 2023-08-28 LAB — POCT INR: INR: 1.8 — AB (ref 2.0–3.0)

## 2023-08-31 ENCOUNTER — Ambulatory Visit: Admitting: Student

## 2023-08-31 ENCOUNTER — Encounter (HOSPITAL_COMMUNITY): Admitting: Internal Medicine

## 2023-09-01 ENCOUNTER — Encounter (HOSPITAL_COMMUNITY): Admitting: Internal Medicine

## 2023-09-04 ENCOUNTER — Ambulatory Visit (HOSPITAL_COMMUNITY): Payer: Self-pay | Admitting: Pharmacist

## 2023-09-04 LAB — POCT INR: INR: 2 (ref 2.0–3.0)

## 2023-09-08 ENCOUNTER — Other Ambulatory Visit (HOSPITAL_COMMUNITY): Payer: Self-pay

## 2023-09-08 MED ORDER — TORSEMIDE 20 MG PO TABS: 20.0000 mg | ORAL_TABLET | Freq: Every day | ORAL | 6 refills | Status: DC

## 2023-09-11 ENCOUNTER — Ambulatory Visit (HOSPITAL_COMMUNITY): Payer: Self-pay | Admitting: Pharmacist

## 2023-09-11 LAB — POCT INR: INR: 2.2 (ref 2.0–3.0)

## 2023-09-12 ENCOUNTER — Telehealth (HOSPITAL_COMMUNITY): Payer: Self-pay | Admitting: *Deleted

## 2023-09-12 NOTE — Telephone Encounter (Signed)
 Received call from pt's wife reporting pt is intermittently experiencing shortness of breath with exertion. Ex: carrying bags of groceries into the house. He is wearing 2L [place] currently. Denies shortness of breath at rest. Advised they may increase O2 to 4 L if needed with exertion. Pt denies feeling poorly. Pt scheduled for follow up in VAD clinic with Dr Julane Ny on Friday.   Paulo Bosworth RN VAD Coordinator  Office: [phone]  24/7 Pager: [phone]

## 2023-09-14 ENCOUNTER — Inpatient Hospital Stay (HOSPITAL_COMMUNITY)
Admission: EM | Admit: 2023-09-14 | Discharge: 2023-09-23 | DRG: 286 | Disposition: A | Discharge status: Home Health | Attending: Cardiology | Admitting: Cardiology

## 2023-09-14 ENCOUNTER — Emergency Department (HOSPITAL_COMMUNITY)

## 2023-09-14 ENCOUNTER — Inpatient Hospital Stay (HOSPITAL_COMMUNITY)

## 2023-09-14 ENCOUNTER — Encounter (HOSPITAL_COMMUNITY): Payer: Self-pay

## 2023-09-14 ENCOUNTER — Other Ambulatory Visit (HOSPITAL_COMMUNITY): Payer: Self-pay | Admitting: Unknown Physician Specialty

## 2023-09-14 DIAGNOSIS — D72829 Elevated white blood cell count, unspecified: Secondary | ICD-10-CM | POA: Diagnosis not present

## 2023-09-14 DIAGNOSIS — Z95811 Presence of heart assist device: Secondary | ICD-10-CM | POA: Diagnosis not present

## 2023-09-14 DIAGNOSIS — R739 Hyperglycemia, unspecified: Secondary | ICD-10-CM | POA: Diagnosis present

## 2023-09-14 DIAGNOSIS — N179 Acute kidney failure, unspecified: Secondary | ICD-10-CM | POA: Diagnosis present

## 2023-09-14 DIAGNOSIS — Z955 Presence of coronary angioplasty implant and graft: Secondary | ICD-10-CM

## 2023-09-14 DIAGNOSIS — J9621 Acute and chronic respiratory failure with hypoxia: Secondary | ICD-10-CM | POA: Diagnosis present

## 2023-09-14 DIAGNOSIS — Z87891 Personal history of nicotine dependence: Secondary | ICD-10-CM

## 2023-09-14 DIAGNOSIS — Z7901 Long term (current) use of anticoagulants: Secondary | ICD-10-CM

## 2023-09-14 DIAGNOSIS — Z1152 Encounter for screening for COVID-19: Secondary | ICD-10-CM

## 2023-09-14 DIAGNOSIS — I4819 Other persistent atrial fibrillation: Secondary | ICD-10-CM | POA: Diagnosis present

## 2023-09-14 DIAGNOSIS — J189 Pneumonia, unspecified organism: Secondary | ICD-10-CM | POA: Diagnosis present

## 2023-09-14 DIAGNOSIS — Z825 Family history of asthma and other chronic lower respiratory diseases: Secondary | ICD-10-CM

## 2023-09-14 DIAGNOSIS — Z95810 Presence of automatic (implantable) cardiac defibrillator: Secondary | ICD-10-CM

## 2023-09-14 DIAGNOSIS — N1831 Chronic kidney disease, stage 3a: Secondary | ICD-10-CM | POA: Diagnosis present

## 2023-09-14 DIAGNOSIS — R339 Retention of urine, unspecified: Secondary | ICD-10-CM | POA: Diagnosis present

## 2023-09-14 DIAGNOSIS — J440 Chronic obstructive pulmonary disease with acute lower respiratory infection: Secondary | ICD-10-CM | POA: Diagnosis present

## 2023-09-14 DIAGNOSIS — I5023 Acute on chronic systolic (congestive) heart failure: Secondary | ICD-10-CM | POA: Diagnosis present

## 2023-09-14 DIAGNOSIS — I255 Ischemic cardiomyopathy: Secondary | ICD-10-CM | POA: Diagnosis present

## 2023-09-14 DIAGNOSIS — E785 Hyperlipidemia, unspecified: Secondary | ICD-10-CM | POA: Diagnosis present

## 2023-09-14 DIAGNOSIS — E875 Hyperkalemia: Secondary | ICD-10-CM | POA: Diagnosis present

## 2023-09-14 DIAGNOSIS — E872 Acidosis, unspecified: Secondary | ICD-10-CM | POA: Diagnosis present

## 2023-09-14 DIAGNOSIS — E876 Hypokalemia: Secondary | ICD-10-CM | POA: Diagnosis present

## 2023-09-14 DIAGNOSIS — J810 Acute pulmonary edema: Secondary | ICD-10-CM | POA: Diagnosis not present

## 2023-09-14 DIAGNOSIS — J432 Centrilobular emphysema: Secondary | ICD-10-CM | POA: Diagnosis present

## 2023-09-14 DIAGNOSIS — I509 Heart failure, unspecified: Secondary | ICD-10-CM | POA: Diagnosis not present

## 2023-09-14 DIAGNOSIS — I2510 Atherosclerotic heart disease of native coronary artery without angina pectoris: Secondary | ICD-10-CM | POA: Diagnosis present

## 2023-09-14 DIAGNOSIS — I5022 Chronic systolic (congestive) heart failure: Secondary | ICD-10-CM | POA: Diagnosis not present

## 2023-09-14 DIAGNOSIS — J9601 Acute respiratory failure with hypoxia: Principal | ICD-10-CM

## 2023-09-14 DIAGNOSIS — I130 Hypertensive heart and chronic kidney disease with heart failure and stage 1 through stage 4 chronic kidney disease, or unspecified chronic kidney disease: Secondary | ICD-10-CM | POA: Diagnosis present

## 2023-09-14 DIAGNOSIS — Z79899 Other long term (current) drug therapy: Secondary | ICD-10-CM

## 2023-09-14 DIAGNOSIS — Z8249 Family history of ischemic heart disease and other diseases of the circulatory system: Secondary | ICD-10-CM

## 2023-09-14 DIAGNOSIS — E861 Hypovolemia: Secondary | ICD-10-CM | POA: Diagnosis present

## 2023-09-14 DIAGNOSIS — Z8679 Personal history of other diseases of the circulatory system: Secondary | ICD-10-CM

## 2023-09-14 DIAGNOSIS — I252 Old myocardial infarction: Secondary | ICD-10-CM

## 2023-09-14 DIAGNOSIS — K31819 Angiodysplasia of stomach and duodenum without bleeding: Secondary | ICD-10-CM | POA: Diagnosis present

## 2023-09-14 DIAGNOSIS — I5021 Acute systolic (congestive) heart failure: Secondary | ICD-10-CM | POA: Diagnosis not present

## 2023-09-14 DIAGNOSIS — E440 Moderate protein-calorie malnutrition: Secondary | ICD-10-CM | POA: Insufficient documentation

## 2023-09-14 DIAGNOSIS — Z5986 Financial insecurity: Secondary | ICD-10-CM

## 2023-09-14 DIAGNOSIS — I5082 Biventricular heart failure: Secondary | ICD-10-CM | POA: Diagnosis present

## 2023-09-14 DIAGNOSIS — Z888 Allergy status to other drugs, medicaments and biological substances status: Secondary | ICD-10-CM

## 2023-09-14 DIAGNOSIS — I5043 Acute on chronic combined systolic (congestive) and diastolic (congestive) heart failure: Secondary | ICD-10-CM | POA: Diagnosis present

## 2023-09-14 DIAGNOSIS — Z833 Family history of diabetes mellitus: Secondary | ICD-10-CM

## 2023-09-14 LAB — STREP PNEUMONIAE URINARY ANTIGEN: Strep Pneumo Urinary Antigen: NEGATIVE

## 2023-09-14 LAB — RESP PANEL BY RT-PCR (RSV, FLU A&B, COVID)  RVPGX2
Influenza A by PCR: NEGATIVE
Influenza B by PCR: NEGATIVE
Resp Syncytial Virus by PCR: NEGATIVE
SARS Coronavirus 2 by RT PCR: NEGATIVE

## 2023-09-14 LAB — CBC WITH DIFFERENTIAL/PLATELET
Abs Immature Granulocytes: 0.06 10*3/uL (ref 0.00–0.07)
Basophils Absolute: 0.1 10*3/uL (ref 0.0–0.1)
Basophils Relative: 1 %
Eosinophils Absolute: 0.2 10*3/uL (ref 0.0–0.5)
Eosinophils Relative: 2 %
HCT: 41.7 % (ref 39.0–52.0)
Hemoglobin: 12.9 g/dL — ABNORMAL LOW (ref 13.0–17.0)
Immature Granulocytes: 0 %
Lymphocytes Relative: 5 %
Lymphs Abs: 0.7 10*3/uL (ref 0.7–4.0)
MCH: 25.3 pg — ABNORMAL LOW (ref 26.0–34.0)
MCHC: 30.9 g/dL (ref 30.0–36.0)
MCV: 81.9 fL (ref 80.0–100.0)
Monocytes Absolute: 1.2 10*3/uL — ABNORMAL HIGH (ref 0.1–1.0)
Monocytes Relative: 9 %
Neutro Abs: 11.6 10*3/uL — ABNORMAL HIGH (ref 1.7–7.7)
Neutrophils Relative %: 83 %
Platelets: 285 10*3/uL (ref 150–400)
RBC: 5.09 MIL/uL (ref 4.22–5.81)
RDW: 16.7 % — ABNORMAL HIGH (ref 11.5–15.5)
WBC: 13.9 10*3/uL — ABNORMAL HIGH (ref 4.0–10.5)
nRBC: 0 % (ref 0.0–0.2)

## 2023-09-14 LAB — COMPREHENSIVE METABOLIC PANEL WITH GFR
ALT: 21 U/L (ref 0–44)
AST: 36 U/L (ref 15–41)
Albumin: 2.9 g/dL — ABNORMAL LOW (ref 3.5–5.0)
Alkaline Phosphatase: 120 U/L (ref 38–126)
Anion gap: 14 (ref 5–15)
BUN: 15 mg/dL (ref 6–20)
CO2: 21 mmol/L — ABNORMAL LOW (ref 22–32)
Calcium: 9.4 mg/dL (ref 8.9–10.3)
Chloride: 104 mmol/L (ref 98–111)
Creatinine, Ser: 2.19 mg/dL — ABNORMAL HIGH (ref 0.61–1.24)
GFR, Estimated: 34 mL/min — ABNORMAL LOW (ref >60)
Glucose, Bld: 121 mg/dL — ABNORMAL HIGH (ref 70–99)
Potassium: 4.4 mmol/L (ref 3.5–5.1)
Sodium: 139 mmol/L (ref 135–145)
Total Bilirubin: 1.9 mg/dL — ABNORMAL HIGH (ref 0.0–1.2)
Total Protein: 8.2 g/dL — ABNORMAL HIGH (ref 6.5–8.1)

## 2023-09-14 LAB — SEDIMENTATION RATE: Sed Rate: 31 mm/h — ABNORMAL HIGH (ref 0–16)

## 2023-09-14 LAB — COOXEMETRY PANEL
Carboxyhemoglobin: 2.2 % — ABNORMAL HIGH (ref 0.5–1.5)
Methemoglobin: <0.7 % (ref 0.0–1.5)
O2 Saturation: 89.1 %
Total hemoglobin: 11.9 g/dL — ABNORMAL LOW (ref 12.0–16.0)

## 2023-09-14 LAB — POCT I-STAT 7, (LYTES, BLD GAS, ICA,H+H)
Acid-base deficit: 1 mmol/L (ref 0.0–2.0)
Bicarbonate: 24.3 mmol/L (ref 20.0–28.0)
Calcium, Ion: 1.22 mmol/L (ref 1.15–1.40)
HCT: 39 % (ref 39.0–52.0)
Hemoglobin: 13.3 g/dL (ref 13.0–17.0)
O2 Saturation: 100 %
Potassium: 4.2 mmol/L (ref 3.5–5.1)
Sodium: 141 mmol/L (ref 135–145)
TCO2: 26 mmol/L (ref 22–32)
pCO2 arterial: 43.2 mmHg (ref 32–48)
pH, Arterial: 7.358 (ref 7.35–7.45)
pO2, Arterial: 211 mmHg — ABNORMAL HIGH (ref 83–108)

## 2023-09-14 LAB — LIPASE, BLOOD: Lipase: 33 U/L (ref 11–51)

## 2023-09-14 LAB — TROPONIN I (HIGH SENSITIVITY)
Troponin I (High Sensitivity): 23 ng/L — ABNORMAL HIGH (ref <18)
Troponin I (High Sensitivity): 21 ng/L — ABNORMAL HIGH (ref <18)

## 2023-09-14 LAB — LACTATE DEHYDROGENASE: LDH: 373 U/L — ABNORMAL HIGH (ref 98–192)

## 2023-09-14 LAB — MAGNESIUM: Magnesium: 1.6 mg/dL — ABNORMAL LOW (ref 1.7–2.4)

## 2023-09-14 LAB — C-REACTIVE PROTEIN: CRP: 3.9 mg/dL — ABNORMAL HIGH (ref <1.0)

## 2023-09-14 LAB — BRAIN NATRIURETIC PEPTIDE: B Natriuretic Peptide: 408.6 pg/mL — ABNORMAL HIGH (ref 0.0–100.0)

## 2023-09-14 LAB — PROTIME-INR
INR: 1.7 — ABNORMAL HIGH (ref 0.8–1.2)
Prothrombin Time: 20.1 s — ABNORMAL HIGH (ref 11.4–15.2)

## 2023-09-14 LAB — LACTIC ACID, PLASMA: Lactic Acid, Venous: 2.5 mmol/L (ref 0.5–1.9)

## 2023-09-14 LAB — MRSA NEXT GEN BY PCR, NASAL: MRSA by PCR Next Gen: NOT DETECTED

## 2023-09-14 MED ORDER — SODIUM CHLORIDE 0.9 % IV SOLN: 250.0000 mL | INTRAVENOUS | Status: AC

## 2023-09-14 MED ORDER — PANTOPRAZOLE SODIUM 40 MG IV SOLR
40.0000 mg | Freq: Every day | INTRAVENOUS | Status: DC
  Administered 2023-09-14 – 2023-09-18 (×5): 40 mg via INTRAVENOUS
  Filled 2023-09-14 (×5): qty 10

## 2023-09-14 MED ORDER — FUROSEMIDE 10 MG/ML IJ SOLN
80.0000 mg | Freq: Two times a day (BID) | INTRAMUSCULAR | Status: DC
  Administered 2023-09-14: 80 mg via INTRAVENOUS
  Filled 2023-09-14: qty 8

## 2023-09-14 MED ORDER — IPRATROPIUM-ALBUTEROL 0.5-2.5 (3) MG/3ML IN SOLN: 3.0000 mL | Freq: Four times a day (QID) | RESPIRATORY_TRACT | Status: DC | PRN

## 2023-09-14 MED ORDER — FENTANYL 2500MCG IN NS 250ML (10MCG/ML) PREMIX INFUSION
0.0000 ug/h | INTRAVENOUS | Status: DC
  Administered 2023-09-14: 50 ug/h via INTRAVENOUS
  Administered 2023-09-15: 100 ug/h via INTRAVENOUS
  Filled 2023-09-14 (×2): qty 250

## 2023-09-14 MED ORDER — REVEFENACIN 175 MCG/3ML IN SOLN
175.0000 ug | Freq: Every day | RESPIRATORY_TRACT | Status: DC
  Administered 2023-09-15 – 2023-09-23 (×9): 175 ug via RESPIRATORY_TRACT
  Filled 2023-09-14 (×9): qty 3

## 2023-09-14 MED ORDER — NOREPINEPHRINE 4 MG/250ML-% IV SOLN
0.0000 ug/min | INTRAVENOUS | Status: DC
  Administered 2023-09-15: 2 ug/min via INTRAVENOUS

## 2023-09-14 MED ORDER — MIDAZOLAM HCL 2 MG/2ML IJ SOLN
4.0000 mg | Freq: Once | INTRAMUSCULAR | Status: AC
  Filled 2023-09-14: qty 4

## 2023-09-14 MED ORDER — KETAMINE HCL 50 MG/5ML IJ SOSY: 70.0000 mg | PREFILLED_SYRINGE | Freq: Once | INTRAMUSCULAR | Status: AC

## 2023-09-14 MED ORDER — ORAL CARE MOUTH RINSE
15.0000 mL | OROMUCOSAL | Status: DC
  Administered 2023-09-14 – 2023-09-16 (×21): 15 mL via OROMUCOSAL

## 2023-09-14 MED ORDER — FAMOTIDINE 20 MG PO TABS
20.0000 mg | ORAL_TABLET | Freq: Two times a day (BID) | ORAL | Status: DC
  Administered 2023-09-14 – 2023-09-15 (×2): 20 mg
  Filled 2023-09-14 (×2): qty 1

## 2023-09-14 MED ORDER — CHLORHEXIDINE GLUCONATE CLOTH 2 % EX PADS
6.0000 | MEDICATED_PAD | Freq: Every day | CUTANEOUS | Status: DC
  Administered 2023-09-14 – 2023-09-23 (×10): 6 via TOPICAL

## 2023-09-14 MED ORDER — FENTANYL BOLUS VIA INFUSION
25.0000 ug | INTRAVENOUS | Status: DC | PRN
  Administered 2023-09-14: 75 ug via INTRAVENOUS
  Administered 2023-09-14: 50 ug via INTRAVENOUS

## 2023-09-14 MED ORDER — LORAZEPAM 2 MG/ML IJ SOLN
1.0000 mg | Freq: Once | INTRAMUSCULAR | Status: AC
  Administered 2023-09-14: 1 mg via INTRAVENOUS
  Filled 2023-09-14: qty 1

## 2023-09-14 MED ORDER — MIDAZOLAM HCL 2 MG/2ML IJ SOLN
INTRAMUSCULAR | Status: AC
  Administered 2023-09-14: 4 mg via INTRAVENOUS
  Filled 2023-09-14: qty 2

## 2023-09-14 MED ORDER — ARFORMOTEROL TARTRATE 15 MCG/2ML IN NEBU: 15.0000 ug | INHALATION_SOLUTION | Freq: Two times a day (BID) | RESPIRATORY_TRACT | Status: DC

## 2023-09-14 MED ORDER — ORAL CARE MOUTH RINSE: 15.0000 mL | OROMUCOSAL | Status: DC | PRN

## 2023-09-14 MED ORDER — METHYLPREDNISOLONE SODIUM SUCC 125 MG IJ SOLR
60.0000 mg | Freq: Two times a day (BID) | INTRAMUSCULAR | Status: DC
  Administered 2023-09-14 – 2023-09-17 (×6): 60 mg via INTRAVENOUS
  Filled 2023-09-14 (×6): qty 2

## 2023-09-14 MED ORDER — AZITHROMYCIN 500 MG IV SOLR
500.0000 mg | INTRAVENOUS | Status: DC
  Filled 2023-09-14: qty 5

## 2023-09-14 MED ORDER — IPRATROPIUM-ALBUTEROL 0.5-2.5 (3) MG/3ML IN SOLN
3.0000 mL | Freq: Once | RESPIRATORY_TRACT | Status: AC
  Administered 2023-09-14: 3 mL via RESPIRATORY_TRACT
  Filled 2023-09-14: qty 3

## 2023-09-14 MED ORDER — KETAMINE HCL 50 MG/5ML IJ SOSY
PREFILLED_SYRINGE | INTRAMUSCULAR | Status: AC
  Administered 2023-09-14: 70 mg via INTRAVENOUS
  Filled 2023-09-14: qty 10

## 2023-09-14 MED ORDER — ROCURONIUM BROMIDE 10 MG/ML (PF) SYRINGE
PREFILLED_SYRINGE | INTRAVENOUS | Status: AC
  Administered 2023-09-14: 70 mg
  Filled 2023-09-14: qty 10

## 2023-09-14 MED ORDER — NOREPINEPHRINE 4 MG/250ML-% IV SOLN
INTRAVENOUS | Status: AC
  Filled 2023-09-14: qty 250

## 2023-09-14 MED ORDER — ONDANSETRON HCL 4 MG/2ML IJ SOLN: 4.0000 mg | Freq: Four times a day (QID) | INTRAMUSCULAR | Status: DC | PRN

## 2023-09-14 MED ORDER — FUROSEMIDE 10 MG/ML IJ SOLN
80.0000 mg | Freq: Once | INTRAMUSCULAR | Status: AC
  Administered 2023-09-14: 80 mg via INTRAVENOUS
  Filled 2023-09-14: qty 8

## 2023-09-14 MED ORDER — SODIUM CHLORIDE 0.9 % IV SOLN
100.0000 mg | Freq: Two times a day (BID) | INTRAVENOUS | Status: AC
  Administered 2023-09-14 – 2023-09-19 (×10): 100 mg via INTRAVENOUS
  Filled 2023-09-14 (×10): qty 100

## 2023-09-14 MED ORDER — SODIUM CHLORIDE 0.9 % IV SOLN
2.0000 g | INTRAVENOUS | Status: AC
  Administered 2023-09-14 – 2023-09-17 (×4): 2 g via INTRAVENOUS
  Filled 2023-09-14 (×5): qty 20

## 2023-09-14 MED ORDER — ACETAMINOPHEN 325 MG PO TABS: 650.0000 mg | ORAL_TABLET | ORAL | Status: DC | PRN

## 2023-09-14 NOTE — Consult Note (Addendum)
 NAME:  David [name], MRN:  [id number], DOB:  09-Oct-1965, LOS: 0 ADMISSION DATE:  09/14/2023, CONSULTATION DATE:  5/22 REFERRING MD:  Bensimhon, CHIEF COMPLAINT:  resp failure   History of Present Illness:  This is a 58 year old male w/ advanced systolic HF, CAD, HTn, CKD stage IIIA. He has been on HM III LVAD (which is his destination d/t CKD) since 2019. Last hospital stay April 2025 for acute HF, prob sepsis, also complicated by persistent hypoxia felt possibly mixed picture of PNA  (felt influenza A PNA w/ post viral PNA) vs amidarone toxicity. During this stay diuresed 15lbs fluid off. Speed of VAD increased and sent home on oxygen.  Followed at [facility] clinic for coumadin  dosing and LVAD clinic post discharge in  April at HF clinic: O2 sats 4 lpm 95% and 92-93% on room air.  5/2 VAD clinic eval as felt had shock by ICD. Event eval showed AFL sustained VT, 1 burst of ATP followed by 40J of HV therapy converting back to AFL/VS." See EP RN note for further details. Amiodarone  resumed.   5/20 nursing calling HF clinic reporting increased exertional shortness of breath, advised to inc Oxygen from 2 to 4 lpm. Clinic f/u scheduled for 5/23 Presents to ER 5/24  5/22 presented to ER w/ worsening SOB, NP dry cough, wt up 2 lbs. + orthopnea. Pulse ox 86% on 2 lpm RR 40s. Marked distress SOB worse in ER. W/ progressive hypoxia. PCXR w/ diffuse pulmonary edema.  Placed on NIPPV, w/ no improvement so PCCM called by HF team for intubation and Critical care support.  Pertinent  Medical History  HFrEF s/p HMIII LVAD implant 2019, cardiogenic shock,recent hospital stay for acute HF and presumed infection, during this stay VAD speed increased to 5600, (last op visit VAD speed 5600, flow 5.3) chronic AC w/ warfarin, gastric AVMs, afib, prior DC/CV, ICD,SAH, HTN, urinary retention, concern for amiodarone  toxicity also during this stay, CKD stage 3a. Prior GIB off ASA  Significant Hospital Events: Including  procedures, antibiotic start and stop dates in addition to other pertinent events   5/22 admitted w/ acute on chronic HF   Interim History / Subjective:  Now s/p intubation   Objective    Blood pressure (!) 131/119, pulse (!) 45, resp. rate (!) 23, SpO2 99%.    FiO2 (%):  [40 %] 40 %  No intake or output data in the 24 hours ending 09/14/23 1549 There were no vitals filed for this visit.  Examination: General: sedated on vent  HENT: NCAT + JVD MMM orally intubated  Lungs: course bilateral breath sounds. PCXR w/ new bilateral airspace disease Cardiovascular: tachycardic has HMIII  Abdomen: soft Extremities: + edema LE, brisk CR. Warm  Neuro: sedated GU: due to void   Resolved problem list   Assessment and Plan  Acute on chronic systolic HF in LVAD dep pt since 2019 w/ underlying h/o CAD and HTN.  GDMT has been limited in past by CKD Plan Tele  LVAD per ADV HF IV lasix  Holding norvasc  for now Ck lactic acid  Cont statin  Acute hypoxic resp failure 2/2 pulmonary edema +/- PNA  Plan Full vent support F/u post intubation CXR and abg PAD protocol RASS goal -2 VAP bundle  Resp culture  Trend BNP Resp viral culture  Urinary antigens Start azith and ceftriaxone   F/u sed rate and CRP  If no response to lasix  and diuretics would need to reconsider amiodarone  as dx of exclusion  Currently being held.  Scheduled BDs  H/O AF and VT has ICD and is on chronic warfarin  Plan Cont amiodarone  for now) Tele K > 4, Mg > 2 Warfarin per pharmacy   Acute on chronic renal failure w/ CKD stage IIIa (baseline cr 1.3 to 1.4)  Plan Maximize CO MAP goal > 65 Renal dose meds  Strict I&O Serial chems  Leukocytosis Plan Trend   Best Practice (right click and "Reselect all SmartList Selections" daily)   Diet/type: NPO DVT prophylaxis Coumadin  Pressure ulcer(s): N/A GI prophylaxis: H2B Lines: N/A Foley:  Yes, and it is still needed Code Status:  limited no compressions has  LVAD Last date of multidisciplinary goals of care discussion [per primary ]  Labs   CBC: No results for input(s): "WBC", "NEUTROABS", "HGB", "HCT", "MCV", "PLT" in the last 168 hours.  Basic Metabolic Panel: No results for input(s): "NA", "K", "CL", "CO2", "GLUCOSE", "BUN", "CREATININE", "CALCIUM ", "MG", "PHOS" in the last 168 hours. GFR: CrCl cannot be calculated (Unknown ideal weight.). No results for input(s): "PROCALCITON", "WBC", "LATICACIDVEN" in the last 168 hours.  Liver Function Tests: No results for input(s): "AST", "ALT", "ALKPHOS", "BILITOT", "PROT", "ALBUMIN " in the last 168 hours. No results for input(s): "LIPASE", "AMYLASE" in the last 168 hours. No results for input(s): "AMMONIA" in the last 168 hours.  ABG    Component Value Date/Time   PHART 7.477 (H) 04/27/2017 0351   PCO2ART 31.7 (L) 04/27/2017 0351   PO2ART 57.0 (L) 04/27/2017 0351   HCO3 27.6 08/08/2023 0818   HCO3 25.7 08/08/2023 0818   TCO2 29 08/08/2023 0818   TCO2 27 08/08/2023 0818   ACIDBASEDEF 1.0 04/24/2017 0926   O2SAT 62 08/08/2023 0818   O2SAT 58 08/08/2023 0818     Coagulation Profile: Recent Labs  Lab 09/11/23 0000  INR 2.2    Cardiac Enzymes: No results for input(s): "CKTOTAL", "CKMB", "CKMBINDEX", "TROPONINI" in the last 168 hours.  HbA1C: Hgb A1c MFr Bld  Date/Time Value Ref Range Status  04/19/2017 07:28 PM 6.1 (H) 4.8 - 5.6 % Final    Comment:    (NOTE) Pre diabetes:          5.7%-6.4% Diabetes:              >6.4% Glycemic control for   <7.0% adults with diabetes   03/13/2017 11:30 AM 6.1 (H) 4.8 - 5.6 % Final    Comment:    (NOTE) Pre diabetes:          5.7%-6.4% Diabetes:              >6.4% Glycemic control for   <7.0% adults with diabetes     CBG: No results for input(s): "GLUCAP" in the last 168 hours.  Review of Systems:   Not able   Past Medical History:  He,  has a past medical history of AICD (automatic cardioverter/defibrillator) present, Anemia,  Anxiety, CAD (coronary artery disease) (2009), CHF (congestive heart failure) (HCC), Chronic kidney disease (11/03/2016), Dysrhythmia, History of blood transfusion, HLD (hyperlipidemia), HTN (hypertension), Hyperlipidemia (03/15/2011), Ischemic cardiomyopathy, Myocardial infarction [facility]), Pneumonia (2018 X 1), Seizures (HCC), and STEMI 2009 (anterior), 2010 (lateral), and 2012 (inferior) (03/14/2011).   Surgical History:   Past Surgical History:  Procedure Laterality Date   AORTIC VALVE REPAIR N/A 04/20/2017   Procedure: AORTIC VALVE REPAIR;  Surgeon: Bartley Lightning, MD;  Location: [facility] OR;  Service: Open Heart Surgery;  Laterality: N/A;   BIV ICD INSERTION CRT-D N/A 08/01/2016   Procedure:  BiV ICD Insertion CRT-D;  Surgeon: Will Cortland Ding, MD;  Location: [facility] INVASIVE CV LAB;  Service: Cardiovascular;  Laterality: N/A;   CARDIAC CATHETERIZATION N/A 05/05/2016   Procedure: Right/Left Heart Cath and Coronary Angiography;  Surgeon: Mardell Shade, MD;  Location: [facility] INVASIVE CV LAB;  Service: Cardiovascular;  Laterality: N/A;   CARDIAC CATHETERIZATION N/A 05/09/2016   Procedure: Coronary Stent Intervention;  Surgeon: Lamerle Jabs M [place], MD;  Location: [facility] INVASIVE CV LAB;  Service: Cardiovascular;  Laterality: N/A;   CARDIAC CATHETERIZATION N/A 05/09/2016   Procedure: Intravascular Pressure Wire/FFR Study;  Surgeon: Vashti Bolanos M [place], MD;  Location: [facility] INVASIVE CV LAB;  Service: Cardiovascular;  Laterality: N/A;   CARDIOVERSION N/A 10/17/2018   Procedure: CARDIOVERSION;  Surgeon: Mardell Shade, MD;  Location: [facility] ENDOSCOPY;  Service: Cardiovascular;  Laterality: N/A;   CORONARY ANGIOPLASTY WITH STENT PLACEMENT     "i've got a total of 12 stents" (04/19/2017)   ELECTROPHYSIOLOGY STUDY N/A 06/24/2016   Procedure: Electrophysiology Study;  Surgeon: Verona Goodwill, MD;  Location: [facility] INVASIVE CV LAB;  Service: Cardiovascular;  Laterality: N/A;   ENTEROSCOPY N/A 06/03/2017   Procedure: ENTEROSCOPY;   Surgeon: Ace Holder, MD;  Location: [facility] ENDOSCOPY;  Service: Gastroenterology;  Laterality: N/A;   EPICARDIAL PACING LEAD PLACEMENT Left 11/07/2016   Procedure: LV EPICARDIAL PACING LEAD PLACEMENT VIA LEFT MINI THORACOTOMY;  Surgeon: Bartley Lightning, MD;  Location: [facility] OR;  Service: Thoracic;  Laterality: Left;   ICD IMPLANT N/A 06/24/2016   Procedure: POSSIBLE  ICD Implant;  Surgeon: Verona Goodwill, MD;  Location: [facility] INVASIVE CV LAB;  Service: Cardiovascular;  Laterality: N/A;   INSERTION OF IMPLANTABLE LEFT VENTRICULAR ASSIST DEVICE N/A 04/20/2017   Procedure: INSERTION OF IMPLANTABLE LEFT VENTRICULAR ASSIST DEVICE, Aortic Valve repair;  Surgeon: Bartley Lightning, MD;  Location: [facility] OR;  Service: Open Heart Surgery;  Laterality: N/A;  Heartmate 3   IR FLUORO GUIDE CV LINE RIGHT  05/08/2017   IR FLUORO GUIDE CV MIDLINE PICC RIGHT  03/14/2017   IR REMOVAL TUN CV CATH W/O FL  05/25/2017   IR US  GUIDE VASC ACCESS RIGHT  03/14/2017   IR US  GUIDE VASC ACCESS RIGHT  05/08/2017   LEAD EXTRACTION N/A 05/24/2023   Procedure: LEAD EXTRACTION;  Surgeon: Boyce Byes, MD;  Location: [facility] INVASIVE CV LAB;  Service: Cardiovascular;  Laterality: N/A;   LEFT HEART CATHETERIZATION WITH CORONARY ANGIOGRAM N/A 03/13/2011   Procedure: LEFT HEART CATHETERIZATION WITH CORONARY ANGIOGRAM;  Surgeon: Avanell Leigh, MD;  Location: [facility] CATH LAB;  Service: Cardiovascular;  Laterality: N/A;   MULTIPLE EXTRACTIONS WITH ALVEOLOPLASTY N/A 04/12/2017   Procedure: Extraction of tooth #'s 2, 5-12, 17, 20-29 with alveoloplasty amd maxillary right buccal exostoses reductions.;  Surgeon: Carol Chroman, DDS;  Location: [facility] OR;  Service: Oral Surgery;  Laterality: N/A;   RIGHT HEART CATH N/A 04/19/2017   Procedure: RIGHT HEART CATH;  Surgeon: Mardell Shade, MD;  Location: [facility] INVASIVE CV LAB;  Service: Cardiovascular;  Laterality: N/A;   RIGHT HEART CATH N/A 08/08/2023   Procedure: RIGHT HEART CATH;  Surgeon:  Mardell Shade, MD;  Location: [facility] INVASIVE CV LAB;  Service: Cardiovascular;  Laterality: N/A;   RIGHT/LEFT HEART CATH AND CORONARY ANGIOGRAPHY N/A 03/10/2017   Procedure: RIGHT/LEFT HEART CATH AND CORONARY ANGIOGRAPHY;  Surgeon: Mardell Shade, MD;  Location: [facility] INVASIVE CV LAB;  Service: Cardiovascular;  Laterality: N/A;   TEE WITHOUT CARDIOVERSION N/A 04/20/2017   Procedure: TRANSESOPHAGEAL ECHOCARDIOGRAM (  TEE);  Surgeon: Bartley Lightning, MD;  Location: [facility] OR;  Service: Open Heart Surgery;  Laterality: N/A;   TEE WITHOUT CARDIOVERSION N/A 10/17/2018   Procedure: TRANSESOPHAGEAL ECHOCARDIOGRAM (TEE);  Surgeon: Mardell Shade, MD;  Location: [facility] ENDOSCOPY;  Service: Cardiovascular;  Laterality: N/A;   TRANSESOPHAGEAL ECHOCARDIOGRAM (CATH LAB) N/A 05/24/2023   Procedure: TRANSESOPHAGEAL ECHOCARDIOGRAM;  Surgeon: Boyce Byes, MD;  Location: [facility] INVASIVE CV LAB;  Service: Cardiovascular;  Laterality: N/A;     Social History:   reports that he quit smoking about 7 years ago. His smoking use included cigarettes and cigars. He started smoking about 50 years ago. He has a 21.5 pack-year smoking history. He has never used smokeless tobacco. He reports that he does not drink alcohol  and does not use drugs.   Family History:  His family history includes Coronary artery disease in his brother, father, mother, and another family member; Diabetes in an other family member; Emphysema in his brother and mother; Hypertension in his daughter, father, mother, sister, and another family member; Obesity in his daughter.   Allergies Allergies  Allergen Reactions   Plavix [Clopidogrel Bisulfate] Hives     Home Medications  Prior to Admission medications   Medication Sig Start Date End Date Taking? Authorizing Provider  acetaminophen  (TYLENOL ) 500 MG tablet Take 1,000 mg by mouth 2 (two) times daily as needed for moderate pain (pain score 4-6), fever or headache.    [provider]   amiodarone  (PACERONE ) 200 MG tablet Take 1 tablet (200 mg total) by mouth daily. 08/25/23   Bensimhon, Rheta Celestine, MD  amLODipine  (NORVASC ) 5 MG tablet Take 1 tablet (5 mg total) by mouth daily. 04/14/23   Bensimhon, Rheta Celestine, MD  atorvastatin  (LIPITOR) 40 MG tablet Take 1 tablet (40 mg total) by mouth daily. 04/14/23   Bensimhon, Rheta Celestine, MD  fluticasone  (FLONASE ) 50 MCG/ACT nasal spray Place 2 sprays into both nostrils daily. Patient taking differently: Place 2 sprays into both nostrils daily as needed for allergies or rhinitis. 04/14/23   Bensimhon, Rheta Celestine, MD  magnesium  oxide (MAG-OX) 400 (240 Mg) MG tablet Take 1 tablet (400 mg total) by mouth 2 (two) times daily. 08/25/23   Bensimhon, Rheta Celestine, MD  Multiple Vitamins-Minerals (MENS 50+ MULTIVITAMIN) TABS Take 1 tablet by mouth daily.    [provider]  pantoprazole  (PROTONIX ) 40 MG tablet Take 1 tablet (40 mg total) by mouth daily. 04/14/23   Bensimhon, Rheta Celestine, MD  potassium chloride  SA (KLOR-CON  M) 20 MEQ tablet Take 1 tablet (20 mEq total) by mouth daily. 08/25/23   Bensimhon, Rheta Celestine, MD  tamsulosin  (FLOMAX ) 0.4 MG CAPS capsule Take 1 capsule (0.4 mg total) by mouth daily. 04/14/23   Bensimhon, Rheta Celestine, MD  torsemide  (DEMADEX ) 20 MG tablet Take 1 tablet (20 mg total) by mouth daily. 09/08/23   Bensimhon, Rheta Celestine, MD  traZODone  (DESYREL ) 50 MG tablet Take 1 tablet (50 mg total) by mouth at bedtime. 04/14/23   Bensimhon, Rheta Celestine, MD  warfarin (COUMADIN ) 2 MG tablet TAKE 1 MG daily per VAD Pharm D 08/11/23   Clegg, Amy D, NP     Critical care time: 34 min

## 2023-09-14 NOTE — ED Notes (Signed)
 [facility] md, rt and pharm and rn at bedside, intubated pt

## 2023-09-14 NOTE — Progress Notes (Signed)
   09/14/23 1500  BiPAP/CPAP/SIPAP  $ Non-Invasive Ventilator  Non-Invasive Vent Set Up;Non-Invasive Vent Initial  $ Face Mask Medium Yes  BiPAP/CPAP/SIPAP Pt Type Adult  BiPAP/CPAP/SIPAP SERVO  Mask Type Full face mask  Set Rate 20 breaths/min  Respiratory Rate 34 breaths/min  IPAP 5 cmH20  EPAP 5 cmH2O  FiO2 (%) 40 %  Minute Ventilation 23.3  Leak 9  Peak Inspiratory Pressure (PIP) 11  Tidal Volume (Vt) 690  Patient Home Machine No  Patient Home Mask No  Patient Home Tubing No  Auto Titrate No  Press High Alarm 25 cmH2O  Press Low Alarm 5 cmH2O  BiPAP/CPAP /SiPAP Vitals  Pulse Rate (!) 45  Resp (!) 23  SpO2 99 %  MEWS Score/Color  MEWS Score 2  MEWS Score Color Yellow     RT placed pt on BIPAP 5/5 on 40% per MD order. Pt is tolerating well. RT will monitor.

## 2023-09-14 NOTE — Procedures (Signed)
 Intubation Procedure Note  BREANDAN PEOPLE  [id number]  Mar 09, 1966  Date:09/14/23  Time:4:10 PM   Provider Performing:Denys Salinger Evie Hoff    Procedure: Intubation (31500)  Indication(s) Respiratory Failure  Consent Risks of the procedure as well as the alternatives and risks of each were explained to the patient and/or caregiver.  Consent for the procedure was obtained and is signed in the bedside chart   Anesthesia Versed , Rocuronium , and ketamine   Time Out Verified patient identification, verified procedure, site/side was marked, verified correct patient position, special equipment/implants available, medications/allergies/relevant history reviewed, required imaging and test results available.   Sterile Technique Usual hand hygeine, masks, and gloves were used   Procedure Description Patient positioned in bed supine.  Sedation given as noted above.  Patient was intubated with endotracheal tube using Glidescope.  View was Grade 1 full glottis .  Number of attempts was 1.  Colorimetric CO2 detector was consistent with tracheal placement.   Complications/Tolerance None; patient tolerated the procedure well. Chest X-ray is ordered to verify placement.   EBL Minimal   Specimen(s) None  Joesph Mussel, DO 09/14/23 4:10 PM [facility]  For contact information, see Amion. If no response to pager, please call PCCM consult pager. After hours, 7PM- 7AM, please call Elink.

## 2023-09-14 NOTE — ED Provider Notes (Signed)
 Emergency Department Provider Note   I have reviewed the triage vital signs and the nursing notes.   HISTORY  Chief Complaint Cough and Shortness of Breath   HPI David [name] is a 58 y.o. male with history of CHF with LVAD presents to the emergency department with shortness of breath.  Patient tells me he had a hospitalization in April with pneumonia following the flu.  He seemed to get better from that but recently has developed return of shortness of breath with dry cough.  No vomiting or diarrhea.  No fevers.  He notes that his weight was up 2 pounds this morning.  No issues with his LVAD.  Denies any chest pain.  Feels much better sitting upright.    Past Medical History:  Diagnosis Date   AICD (automatic cardioverter/defibrillator) present    Anemia    Anxiety    CAD (coronary artery disease) 2009   AMI in 12/2007 with PCI to LAD, staged PCI to  mid/distal RCA, NSTEMI in 02/2009 with BMS to LCx   CHF (congestive heart failure) (HCC)    Chronic kidney disease 11/03/2016   stage 3 kidney disease   Dysrhythmia    "arrythmia problems at some point", "fatal rhythms"   History of blood transfusion    "I was bleeding from was rectum" (04/19/2017)   HLD (hyperlipidemia)    HTN (hypertension)    Hyperlipidemia 03/15/2011   Ischemic cardiomyopathy    Admitted in 07/2010 with CHF exacerbation   Myocardial infarction (HCC)    "I've had 4" (04/19/2017)   Pneumonia 2018 X 1   Seizures (HCC)    one seizure in 04/2016 during cardiac event (04/19/2017)   STEMI 2009 (anterior), 2010 (lateral), and 2012 (inferior) 03/14/2011    Review of Systems  Constitutional: No fever/chills Cardiovascular: Denies chest pain. Respiratory: Positive shortness of breath. Gastrointestinal: No abdominal pain.  No nausea, no vomiting.  No diarrhea.   Musculoskeletal: Negative for back pain. Skin: Negative for rash. Neurological: Negative for  headaches.  ____________________________________________   PHYSICAL EXAM:  VITAL SIGNS:    Pulse Rate 09/14/23 1429 90     Resp 09/14/23 1429 (!) 38     SpO2 09/14/23 1429 97 %   Constitutional: Alert and oriented. Sitting upright at the bedside. Able to speak in 3-4 word sentences. No apparent confusion.  Eyes: Conjunctivae are normal.  Head: Atraumatic. Nose: No congestion/rhinnorhea. Mouth/Throat: Mucous membranes are moist.   Neck: No stridor.   Cardiovascular: Mechanical LVAD noise throughout. Good peripheral circulation.  Respiratory: Increased respiratory effort.  No retractions. Lungs with course wheeze bilaterally and crackles at the bases.  Gastrointestinal: Soft and nontender. No distention.  Musculoskeletal: No lower extremity tenderness nor edema. No gross deformities of extremities. Neurologic:  Normal speech and language. Skin:  Skin is warm, dry and intact. No rash noted.  ____________________________________________   LABS (all labs ordered are listed, but only abnormal results are displayed)  Labs Reviewed  BRAIN NATRIURETIC PEPTIDE - Abnormal; Notable for the following components:      Result Value   B Natriuretic Peptide 408.6 (*)    All other components within normal limits  COMPREHENSIVE METABOLIC PANEL WITH GFR - Abnormal; Notable for the following components:   CO2 21 (*)    Glucose, Bld 121 (*)    Creatinine, Ser 2.19 (*)    Total Protein 8.2 (*)    Albumin  2.9 (*)    Total Bilirubin 1.9 (*)    GFR, Estimated 34 (*)  All other components within normal limits  CBC WITH DIFFERENTIAL/PLATELET - Abnormal; Notable for the following components:   WBC 13.9 (*)    Hemoglobin 12.9 (*)    MCH 25.3 (*)    RDW 16.7 (*)    Neutro Abs 11.6 (*)    Monocytes Absolute 1.2 (*)    All other components within normal limits  PROTIME-INR - Abnormal; Notable for the following components:   Prothrombin  Time 20.1 (*)    INR 1.7 (*)    All other components  within normal limits  LACTATE DEHYDROGENASE - Abnormal; Notable for the following components:   LDH 373 (*)    All other components within normal limits  SEDIMENTATION RATE - Abnormal; Notable for the following components:   Sed Rate 31 (*)    All other components within normal limits  C-REACTIVE PROTEIN - Abnormal; Notable for the following components:   CRP 3.9 (*)    All other components within normal limits  LACTIC ACID, PLASMA - Abnormal; Notable for the following components:   Lactic Acid, Venous 2.5 (*)    All other components within normal limits  LACTIC ACID, PLASMA - Abnormal; Notable for the following components:   Lactic Acid, Venous 2.3 (*)    All other components within normal limits  LACTATE DEHYDROGENASE - Abnormal; Notable for the following components:   LDH 211 (*)    All other components within normal limits  PROTIME-INR - Abnormal; Notable for the following components:   Prothrombin  Time 20.5 (*)    INR 1.7 (*)    All other components within normal limits  CBC - Abnormal; Notable for the following components:   Hemoglobin 11.6 (*)    HCT 37.9 (*)    MCH 24.8 (*)    RDW 16.6 (*)    All other components within normal limits  BASIC METABOLIC PANEL WITH GFR - Abnormal; Notable for the following components:   Potassium 5.8 (*)    Glucose, Bld 154 (*)    BUN 22 (*)    Creatinine, Ser 2.75 (*)    GFR, Estimated 26 (*)    All other components within normal limits  SEDIMENTATION RATE - Abnormal; Notable for the following components:   Sed Rate 37 (*)    All other components within normal limits  MAGNESIUM  - Abnormal; Notable for the following components:   Magnesium  1.6 (*)    All other components within normal limits  COOXEMETRY PANEL - Abnormal; Notable for the following components:   Total hemoglobin 11.9 (*)    Carboxyhemoglobin 2.2 (*)    All other components within normal limits  GLUCOSE, CAPILLARY - Abnormal; Notable for the following components:    Glucose-Capillary 142 (*)    All other components within normal limits  POCT I-STAT 7, (LYTES, BLD GAS, ICA,H+H) - Abnormal; Notable for the following components:   pO2, Arterial 211 (*)    All other components within normal limits  TROPONIN I (HIGH SENSITIVITY) - Abnormal; Notable for the following components:   Troponin I (High Sensitivity) 23 (*)    All other components within normal limits  TROPONIN I (HIGH SENSITIVITY) - Abnormal; Notable for the following components:   Troponin I (High Sensitivity) 21 (*)    All other components within normal limits  RESP PANEL BY RT-PCR (RSV, FLU A&B, COVID)  RVPGX2  RESPIRATORY PANEL BY PCR  MRSA NEXT GEN BY PCR, NASAL  CULTURE, RESPIRATORY W GRAM STAIN  MRSA NEXT GEN BY PCR, NASAL  LIPASE,  BLOOD  STREP PNEUMONIAE URINARY ANTIGEN  PROCALCITONIN  BLOOD GAS, ARTERIAL  LEGIONELLA PNEUMOPHILA SEROGP 1 UR AG   ____________________________________________  EKG   EKG Interpretation Date/Time:  Thursday Sep 14 2023 14:36:35 EDT Ventricular Rate:  95 PR Interval:    QRS Duration:  143 QT Interval:  462 QTC Calculation: 578 R Axis:   198  Text Interpretation: LVAD Ventricular premature complex Nonspecific intraventricular conduction delay Artifact in lead(s) I II III aVR aVL aVF V1 V2 V4 V5 V6 Unchanged from prior Confirmed by Abby Hocking [phone]) on 09/14/2023 2:46:13 PM        ____________________________________________   PROCEDURES  Procedure(s) performed:   Procedures  CRITICAL CARE Performed by: Roberts Ching Total critical care time: 35 minutes Critical care time was exclusive of separately billable procedures and treating other patients. Critical care was necessary to treat or prevent imminent or life-threatening deterioration. Critical care was time spent personally by me on the following activities: development of treatment plan with patient and/or surrogate as well as nursing, discussions with consultants, evaluation of  patient's response to treatment, examination of patient, obtaining history from patient or surrogate, ordering and performing treatments and interventions, ordering and review of laboratory studies, ordering and review of radiographic studies, pulse oximetry and re-evaluation of patient's condition.  Abby Hocking, MD Emergency Medicine  ____________________________________________   INITIAL IMPRESSION / ASSESSMENT AND PLAN / ED COURSE  Pertinent labs & imaging results that were available during my care of the patient were reviewed by me and considered in my medical decision making (see chart for details).   This patient is Presenting for Evaluation of SOB, which does require a range of treatment options, and is a complaint that involves a high risk of morbidity and mortality.  The Differential Diagnoses includes but is not exclusive to acute coronary syndrome, aortic dissection, pulmonary embolism, cardiac tamponade, community-acquired pneumonia, pericarditis, musculoskeletal chest wall pain, etc.  I did obtain Additional Historical Information from EMS.   I decided to review pertinent External Data, and in summary patient with LVAD followed at [facility].   Clinical Laboratory Tests Ordered, included BNP mildly elevated to 408.  Minimally elevated troponin.  Creatinine 2.19.  Radiologic Tests Ordered, included CXR. I independently interpreted the images and agree with radiology interpretation.   Consult complete with LVAD coordinator upon arrival.   Medical Decision Making: Summary:  Patient arrives to the emergency department with shortness of breath.  Some JVP noted on exam but coarse wheezing on my initial evaluation.  No asthma or COPD history.  Will try DuoNeb to see if this helps fairly quickly but patient may ultimately require BiPAP.   Reevaluation with update and discussion with patient. Transitioned to BiPAP. Cardiology team at bedside with plan to admit. Patient tolerating BiPAP  well.   Patient's presentation is most consistent with acute presentation with potential threat to life or bodily function.   Disposition: admit  ____________________________________________  FINAL CLINICAL IMPRESSION(S) / ED DIAGNOSES  Final diagnoses:  Acute respiratory failure with hypoxia (HCC)    Note:  This document was prepared using Dragon voice recognition software and may include unintentional dictation errors.  Abby Hocking, MD, [facility] Emergency Medicine    Leara Rawl, Shereen Dike, MD 09/15/23 [phone]

## 2023-09-14 NOTE — ED Triage Notes (Signed)
 Pt BIBEMS from home, pt was admitted in April- flu then got pneumonia after discharge, SHOB, difficulty breathing, and dry nonproductive cough. No n/v/d, no fever. Pt is unable to get comfortable while he is awake but able to sleep comfortable.  Cap 11-27 86-92% on 2L  109 CBG 84 HR

## 2023-09-14 NOTE — Procedures (Signed)
 Central Venous Catheter Insertion Procedure Note  MATTHEU BRODERSEN  [id number]  25-Oct-1965  Date:09/14/23  Time:6:59 PM   Provider Performing:Pete Zachery Hermes Burna Carrier   Procedure: Insertion of Non-tunneled Central Venous Catheter(36556) with US  guidance (40981)   Indication(s) Difficult access  Consent Unable to obtain consent due to emergent nature of procedure.  Anesthesia Topical only with 1% lidocaine    Timeout Verified patient identification, verified procedure, site/side was marked, verified correct patient position, special equipment/implants available, medications/allergies/relevant history reviewed, required imaging and test results available.  Sterile Technique Maximal sterile technique including full sterile barrier drape, hand hygiene, sterile gown, sterile gloves, mask, hair covering, sterile ultrasound probe cover (if used).  Procedure Description Area of catheter insertion was cleaned with chlorhexidine  and draped in sterile fashion.  With real-time ultrasound guidance a central venous catheter was placed into the right internal jugular vein. Nonpulsatile blood flow and easy flushing noted in all ports.  The catheter was sutured in place and sterile dressing applied.  Complications/Tolerance None; patient tolerated the procedure well. Chest X-ray is ordered to verify placement for internal jugular or subclavian cannulation.   Chest x-ray is not ordered for femoral cannulation.  EBL Minimal  Specimen(s) None

## 2023-09-14 NOTE — H&P (Addendum)
 Advanced Heart Failure Team History and Physical Note   PCP:  Abbe Hoard., MD  PCP-Cardiology: Jules Oar, MD    Reason for Admission: Acute respiratory distress HPI:    [name] is a 58 y.o. male with history of ICD (09 PCI to LAD, mRCA, '10 PCI to Lcx and '12 mRCA), HFrEF (EF ~20% for > 5 years), CKD3, gastric AVMs, chronic AF, SAH, HLD, and HTN. S/P LVAD HM-III 04/20/18.    S/P HM3 LVAD implant on 04/20/18, course c/b PNA shock liver and AKI requiring brief HD.   Readmitted 2/19 with hgb 3.8. INR 2.9. 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   Admitted in 2/23 with SAH in setting of HTN. Underwent ventriculostomy. Admission c/b by periods of confusion and urinary retention. Discharged to CIR   Was seen for acute visit on 12/24 for ICD firing x 2 on Thanksgiving. Seen by Dr. Rodolfo Clan and ICD reprogrammed. Found to have lead fracture. Now s/p lead extraction and new ICD 1/25 c/b small chest hematoma.    He was admitted from VAD clinic twice in April. Once for hypovolemia and AKI in the setting of Flu A. The second for volume overload and PNA, treated with abx. RHC during most recent admission (after diuresis) showed optimized hemodynamics, however had persistent hypoxia. CT chest showed GG airspace disease in right lung, pulmonary edema, ATX left base, emphysema. There was some concern for possible amio toxicity, therefore amio stopped. Was discharge with home O2.   Of note, had ICD shock on 08/24/23. Interrogation showed VT terminated by ATP with 40J HV therapy. Amio resumed 5/2.  Presented to [facility] with respiratory distress and hypoxia. Had been feeling progressively short of breath for the past week. Wife stated that he has not been sick recently, has been using his home O2, has not had an appetite recently. Vitals on admission notable for RR 40-50 and O2 sats of 94% on Bipap at 40%,  MAP 80-90, he was given IV ativan and lasix  without improvement. No VAD alarms or events noted on history. Labs notable for K 4.4, BUN/Cr 15/2.19, Tbili 1.9, hs-trop 23, hgb 12.9, wbc 14, INR INR 1.7. CXR showed significant pulmonary congestion. RVP pending. On AHF arrival, labored breathing in tripod position. Discussed intubation with patient and wife, who were both agreeable. Critical care MD called to bedside to eval and subsequently intubated and transported to the ICU.   LVAD Interrogation HM III: Speed: 5600 Flow: 5.2 PI: 2.3 Power: 3.7. No PI events  Home Medications Prior to Admission medications   Medication Sig Start Date End Date Taking? Authorizing Provider  acetaminophen  (TYLENOL ) 500 MG tablet Take 1,000 mg by mouth 2 (two) times daily as needed for moderate pain (pain score 4-6), fever or headache.    [provider]  amiodarone  (PACERONE ) 200 MG tablet Take 1 tablet (200 mg total) by mouth daily. 08/25/23   Alsha Meland, Rheta Celestine, MD  amLODipine  (NORVASC ) 5 MG tablet Take 1 tablet (5 mg total) by mouth daily. 04/14/23   Kidus Delman, Rheta Celestine, MD  atorvastatin  (LIPITOR) 40 MG tablet Take 1 tablet (40 mg total) by mouth daily. 04/14/23   Jacquilyn Seldon, Rheta Celestine, MD  fluticasone  (FLONASE ) 50 MCG/ACT nasal spray Place 2 sprays into both nostrils daily. Patient taking differently: Place 2 sprays into both nostrils daily as needed for allergies or rhinitis. 04/14/23   Coy Rochford, Rheta Celestine,  MD  magnesium  oxide (MAG-OX) 400 (240 Mg) MG tablet Take 1 tablet (400 mg total) by mouth 2 (two) times daily. 08/25/23   Darleen Moffitt, Rheta Celestine, MD  Multiple Vitamins-Minerals (MENS 50+ MULTIVITAMIN) TABS Take 1 tablet by mouth daily.    [provider]  pantoprazole  (PROTONIX ) 40 MG tablet Take 1 tablet (40 mg total) by mouth daily. 04/14/23   Sohan Potvin, Rheta Celestine, MD  potassium chloride  SA (KLOR-CON  M) 20 MEQ tablet Take 1 tablet (20 mEq total) by mouth daily. 08/25/23   Miya Luviano, Rheta Celestine, MD   tamsulosin  (FLOMAX ) 0.4 MG CAPS capsule Take 1 capsule (0.4 mg total) by mouth daily. 04/14/23   Teshia Mahone, Rheta Celestine, MD  torsemide  (DEMADEX ) 20 MG tablet Take 1 tablet (20 mg total) by mouth daily. 09/08/23   Dorinne Graeff, Rheta Celestine, MD  traZODone  (DESYREL ) 50 MG tablet Take 1 tablet (50 mg total) by mouth at bedtime. 04/14/23   Nile Dorning, Rheta Celestine, MD  warfarin (COUMADIN ) 2 MG tablet TAKE 1 MG daily per VAD Pharm D 08/11/23   Nieves Bars D, NP    Past Medical History: Past Medical History:  Diagnosis Date   AICD (automatic cardioverter/defibrillator) present    Anemia    Anxiety    CAD (coronary artery disease) 2009   AMI in 12/2007 with PCI to LAD, staged PCI to  mid/distal RCA, NSTEMI in 02/2009 with BMS to LCx   CHF (congestive heart failure) (HCC)    Chronic kidney disease 11/03/2016   stage 3 kidney disease   Dysrhythmia    "arrythmia problems at some point", "fatal rhythms"   History of blood transfusion    "I was bleeding from was rectum" (04/19/2017)   HLD (hyperlipidemia)    HTN (hypertension)    Hyperlipidemia 03/15/2011   Ischemic cardiomyopathy    Admitted in 07/2010 with CHF exacerbation   Myocardial infarction (HCC)    "I've had 4" (04/19/2017)   Pneumonia 2018 X 1   Seizures (HCC)    one seizure in 04/2016 during cardiac event (04/19/2017)   STEMI 2009 (anterior), 2010 (lateral), and 2012 (inferior) 03/14/2011    Past Surgical History: Past Surgical History:  Procedure Laterality Date   AORTIC VALVE REPAIR N/A 04/20/2017   Procedure: AORTIC VALVE REPAIR;  Surgeon: Bartley Lightning, MD;  Location: [facility] OR;  Service: Open Heart Surgery;  Laterality: N/A;   BIV ICD INSERTION CRT-D N/A 08/01/2016   Procedure: BiV ICD Insertion CRT-D;  Surgeon: Will Cortland Ding, MD;  Location: [facility] INVASIVE CV LAB;  Service: Cardiovascular;  Laterality: N/A;   CARDIAC CATHETERIZATION N/A 05/05/2016   Procedure: Right/Left Heart Cath and Coronary Angiography;  Surgeon: Mardell Shade,  MD;  Location: [facility] INVASIVE CV LAB;  Service: Cardiovascular;  Laterality: N/A;   CARDIAC CATHETERIZATION N/A 05/09/2016   Procedure: Coronary Stent Intervention;  Surgeon: Peter M Swaziland, MD;  Location: [facility] INVASIVE CV LAB;  Service: Cardiovascular;  Laterality: N/A;   CARDIAC CATHETERIZATION N/A 05/09/2016   Procedure: Intravascular Pressure Wire/FFR Study;  Surgeon: Peter M Swaziland, MD;  Location: [facility] INVASIVE CV LAB;  Service: Cardiovascular;  Laterality: N/A;   CARDIOVERSION N/A 10/17/2018   Procedure: CARDIOVERSION;  Surgeon: Mardell Shade, MD;  Location: [facility] ENDOSCOPY;  Service: Cardiovascular;  Laterality: N/A;   CORONARY ANGIOPLASTY WITH STENT PLACEMENT     "i've got a total of 12 stents" (04/19/2017)   ELECTROPHYSIOLOGY STUDY N/A 06/24/2016   Procedure: Electrophysiology Study;  Surgeon: Verona Goodwill, MD;  Location: [facility] INVASIVE CV LAB;  Service: Cardiovascular;  Laterality: N/A;   ENTEROSCOPY N/A 06/03/2017   Procedure: ENTEROSCOPY;  Surgeon: Ace Holder, MD;  Location: [facility] ENDOSCOPY;  Service: Gastroenterology;  Laterality: N/A;   EPICARDIAL PACING LEAD PLACEMENT Left 11/07/2016   Procedure: LV EPICARDIAL PACING LEAD PLACEMENT VIA LEFT MINI THORACOTOMY;  Surgeon: Bartley Lightning, MD;  Location: [facility] OR;  Service: Thoracic;  Laterality: Left;   ICD IMPLANT N/A 06/24/2016   Procedure: POSSIBLE  ICD Implant;  Surgeon: Verona Goodwill, MD;  Location: [facility] INVASIVE CV LAB;  Service: Cardiovascular;  Laterality: N/A;   INSERTION OF IMPLANTABLE LEFT VENTRICULAR ASSIST DEVICE N/A 04/20/2017   Procedure: INSERTION OF IMPLANTABLE LEFT VENTRICULAR ASSIST DEVICE, Aortic Valve repair;  Surgeon: Bartley Lightning, MD;  Location: [facility] OR;  Service: Open Heart Surgery;  Laterality: N/A;  Heartmate 3   IR FLUORO GUIDE CV LINE RIGHT  05/08/2017   IR FLUORO GUIDE CV MIDLINE PICC RIGHT  03/14/2017   IR REMOVAL TUN CV CATH W/O FL  05/25/2017   IR US  GUIDE VASC ACCESS RIGHT  03/14/2017   IR US  GUIDE  VASC ACCESS RIGHT  05/08/2017   LEAD EXTRACTION N/A 05/24/2023   Procedure: LEAD EXTRACTION;  Surgeon: Boyce Byes, MD;  Location: [facility] INVASIVE CV LAB;  Service: Cardiovascular;  Laterality: N/A;   LEFT HEART CATHETERIZATION WITH CORONARY ANGIOGRAM N/A 03/13/2011   Procedure: LEFT HEART CATHETERIZATION WITH CORONARY ANGIOGRAM;  Surgeon: Avanell Leigh, MD;  Location: [facility] CATH LAB;  Service: Cardiovascular;  Laterality: N/A;   MULTIPLE EXTRACTIONS WITH ALVEOLOPLASTY N/A 04/12/2017   Procedure: Extraction of tooth #'s 2, 5-12, 17, 20-29 with alveoloplasty amd maxillary right buccal exostoses reductions.;  Surgeon: Carol Chroman, DDS;  Location: [facility] OR;  Service: Oral Surgery;  Laterality: N/A;   RIGHT HEART CATH N/A 04/19/2017   Procedure: RIGHT HEART CATH;  Surgeon: Mardell Shade, MD;  Location: [facility] INVASIVE CV LAB;  Service: Cardiovascular;  Laterality: N/A;   RIGHT HEART CATH N/A 08/08/2023   Procedure: RIGHT HEART CATH;  Surgeon: Mardell Shade, MD;  Location: [facility] INVASIVE CV LAB;  Service: Cardiovascular;  Laterality: N/A;   RIGHT/LEFT HEART CATH AND CORONARY ANGIOGRAPHY N/A 03/10/2017   Procedure: RIGHT/LEFT HEART CATH AND CORONARY ANGIOGRAPHY;  Surgeon: Mardell Shade, MD;  Location: [facility] INVASIVE CV LAB;  Service: Cardiovascular;  Laterality: N/A;   TEE WITHOUT CARDIOVERSION N/A 04/20/2017   Procedure: TRANSESOPHAGEAL ECHOCARDIOGRAM (TEE);  Surgeon: Bartley Lightning, MD;  Location: [facility] OR;  Service: Open Heart Surgery;  Laterality: N/A;   TEE WITHOUT CARDIOVERSION N/A 10/17/2018   Procedure: TRANSESOPHAGEAL ECHOCARDIOGRAM (TEE);  Surgeon: Mardell Shade, MD;  Location: [facility] ENDOSCOPY;  Service: Cardiovascular;  Laterality: N/A;   TRANSESOPHAGEAL ECHOCARDIOGRAM (CATH LAB) N/A 05/24/2023   Procedure: TRANSESOPHAGEAL ECHOCARDIOGRAM;  Surgeon: Boyce Byes, MD;  Location: [facility] INVASIVE CV LAB;  Service: Cardiovascular;  Laterality: N/A;    Family History:  Family  History  Problem Relation Age of Onset   Coronary artery disease Father    Hypertension Father    Hypertension Sister    Coronary artery disease Brother    Emphysema Brother    Coronary artery disease Mother    Hypertension Mother    Emphysema Mother    Hypertension Daughter    Obesity Daughter    Diabetes Other    Hypertension Other    Coronary artery disease Other     Social History: Social History   Socioeconomic History   Marital status: Married  Spouse name: Tarri Farm   Number of children: 3   Years of education: Not on file   Highest education level: Not on file  Occupational History   Not on file  Tobacco Use   Smoking status: Former    Current packs/day: 0.00    Average packs/day: 0.5 packs/day for 43.0 years (21.5 ttl pk-yrs)    Types: Cigarettes, Cigars    Start date: 04/29/1973    Quit date: 04/29/2016    Years since quitting: 7.3   Smokeless tobacco: Never  Vaping Use   Vaping status: Former   Quit date: 04/29/2016  Substance and Sexual Activity   Alcohol  use: No   Drug use: No   Sexual activity: Yes  Other Topics Concern   Not on file  Social History Narrative   Not on file   Social Drivers of Health   Financial Resource Strain: High Risk (07/04/2022)   Overall Financial Resource Strain (CARDIA)    Difficulty of Paying Living Expenses: Very hard  Food Insecurity: No Food Insecurity (08/03/2023)   Hunger Vital Sign    Worried About Running Out of Food in the Last Year: Never true    Ran Out of Food in the Last Year: Never true  Recent Concern: Food Insecurity - Food Insecurity Present (05/24/2023)   Hunger Vital Sign    Worried About Running Out of Food in the Last Year: Never true    Ran Out of Food in the Last Year: Sometimes true  Transportation Needs: No Transportation Needs (08/03/2023)   PRAPARE - Administrator, Civil Service (Medical): No    Lack of Transportation (Non-Medical): No  Physical Activity: Inactive (03/10/2017)    Exercise Vital Sign    Days of Exercise per Week: 0 days    Minutes of Exercise per Session: 0 min  Stress: No Stress Concern Present (03/10/2017)   [employer] of Occupational Health - Occupational Stress Questionnaire    Feeling of Stress : Not at all  Social Connections: Not on file    Allergies:  Allergies  Allergen Reactions   Plavix [Clopidogrel Bisulfate] Hives    Objective:    Vital Signs:   Pulse Rate:  [37-95] 59 (05/22 1608) Resp:  [23-42] 26 (05/22 1608) BP: (94-131)/(68-119) 94/69 (05/22 1608) SpO2:  [97 %-100 %] 100 % (05/22 1608) FiO2 (%):  [40 %] 40 % (05/22 1500)   There were no vitals filed for this visit.  Physical Exam     General: Respiratory distress on bipap Cardiac: JVP to jaw. Mechanical heart sounds with LVAD hum present.  Resp: Rales, rhonchi, and wheezing throughout Extremities: Warm and dry.  No peripheral edema.  Driveline: Dressing C/D/I. No drainage or redness. Anchor in place. Neuro: Alert and oriented x3. Affect pleasant. Moves all extremities without difficulty.  Telemetry   AF in 80s (personally reviewed)  EKG   AF 95 bpm with PVCs (personally reviewed)  Labs     Basic Metabolic Panel: Recent Labs  Lab 09/14/23 1434  NA 139  K 4.4  CL 104  CO2 21*  GLUCOSE 121*  BUN 15  CREATININE 2.19*  CALCIUM  9.4    Liver Function Tests: Recent Labs  Lab 09/14/23 1434  AST 36  ALT 21  ALKPHOS 120  BILITOT 1.9*  PROT 8.2*  ALBUMIN  2.9*   Recent Labs  Lab 09/14/23 1434  LIPASE 33   CBC: Recent Labs  Lab 09/14/23 1434  WBC 13.9*  NEUTROABS 11.6*  HGB 12.9*  HCT 41.7  MCV 81.9  PLT 285   BNP (last 3 results) Recent Labs    08/03/23 0904  BNP 470.3*   Coagulation Studies: Recent Labs    09/14/23 1530  LABPROT 20.1*  INR 1.7*   Imaging: DG Chest Portable 1 View Result Date: 09/14/2023 CLINICAL DATA:  Shortness of breath, cough. EXAM: PORTABLE CHEST 1 VIEW COMPARISON:  August 03, 2023. FINDINGS:  Stable cardiomegaly. Left-sided defibrillator is unchanged. Left ventricular assist device is unchanged. Increased diffuse reticulonodular densities are noted concerning for edema or possibly pneumonia. Bony thorax is unremarkable. IMPRESSION: Stable support apparatus as noted above. Increased bilateral diffuse lung opacities are noted concerning for edema or possibly pneumonia. Electronically Signed   By: Rosalene Colon M.D.   On: 09/14/2023 16:05   Patient Profile   David [name] is a 58 y.o. male with history of ICD (09 PCI to LAD, mRCA, '10 PCI to Lcx and '12 mRCA), HFrEF (EF ~20% for > 5 years), HLD and HTN. S/P LVAD HM-III 04/20/18.   Assessment/Plan   1. Acute Hypoxic Respiratory Failure: admitted earlier in the month with AHRF. ?PNA post viral infection. Newly requiring home O2. Presented today with respiratory distress despite bipap requiring intubation.  - Hi-Res chest CT 4/25: GG airspace disease in right lung, pulmonary edema, ATX left base, emphysema.  - Concern for amio toxicity last admission, recently placed back on amio for ICD shock. Hold amio. Check ESR - CXR with significant pulmonary congestion - diuresis with IV lasix  - vent management per CCM - start abx/stress dose steroids per CCM - repeat Hi-res CT once stable to eval for changes.    2 . Chronic systolic CHF with HM III LVAD implant 04/20/17 - RHC 4/15: RA 6, PA mean 35, PCWP 19, Fick CI 3.1, TD CI 3.0. Speed 5600 - Concern for RV dysfunction given HF presentation - Given Lasix  80 mg IV, start 80 mg IV bid tonight. Place foley - CVP monitoring once has central access - CTM renal function  - repeat echo   3. VAD management - VAD interrogated personally. Parameters stable. No events - INR 1.7 (goal 1.5-2). PharmD dosing warfarin.  - LDH 373, baseline 200    4. AKI on CKD stage 3: basleine 1.5-1.8 - sCr 2.19 on admission - suspect renal congestion - diuresis as above - avoid hypotension   5. Hx of  Ventricular tachycardia - Had ICD shock x 2 for VT on 11/24 (device reprogrammed at the time) - S/p fracture lead removal and ICD exchange 1/25 - ICD shock for VT 5/1, amio resumed - Hold amio with concern for lung toxicity   6. Persistent AF - most recent ICD interrogation with 100% AF burden - in AF 80s on tele - on warfarin; hold amio  7. CAD:  s/p PCI RCA/PLOM with DES x2 and DES to mild LAD 1/18 - No chest pain.  - Continue statin. Off ASA due to GIB/AVMs   8. Urinary retention:  - foley catheter - resume Flomax  prior to removal  I reviewed the LVAD parameters from today, and compared the results to the patient's prior recorded data.  No programming changes were made.  The LVAD is functioning within specified parameters.  The patient performs LVAD self-test daily.  LVAD interrogation was negative for any significant power changes, alarms or PI events/speed drops.  LVAD equipment check completed and is in good working order.  Back-up equipment present.   LVAD education done on emergency procedures  and precautions and reviewed exit site care.  CRITICAL CARE Performed by: [place] Lee  Total critical care time: 17 minutes  Critical care time was exclusive of separately billable procedures and treating other patients.  Critical care was necessary to treat or prevent imminent or life-threatening deterioration.  Critical care was time spent personally by me on the following activities: development of treatment plan with patient and/or surrogate as well as nursing, discussions with consultants, evaluation of patient's response to treatment, examination of patient, obtaining history from patient or surrogate, ordering and performing treatments and interventions, ordering and review of laboratory studies, ordering and review of radiographic studies, pulse oximetry and re-evaluation of patient's condition.  [place] Lee, NP 09/14/2023, 4:29 PM  Advanced Heart Failure Team Pager [phone]  (M-F; 7a - 5p)  Please contact [facility] for night-coverage after hours (4p -7a ) and weekends on [email or web address]  Agree with above.   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 Amio stopped but restarted 08/25/23 after ICD shock for VT  Admitted with several days progressive/severe SOB.   In ER in extremis. CXR with diffuse bilateral infiltrates. Intubated by CCM.  General:  intubated/sedated HEENT: +ETT Neck: supple. JVP to jaw   Cor: LVAD hum.  Lungs: + wheeze Abdomen:  soft, nontender, non-distended. No hepatosplenomegaly. No bruits or masses. Good bowel sounds. Driveline site clean. Anchor in place.  Extremities: no cyanosis, clubbing, rash. Warm no edema  Neuro: intubated/sedated  Etiology of respiratory distress remains unclear. Pulmonary edema, infectious vs amio toxicity.   Diurese. Start abx. Check ESR and add steroids.   D/w his wife  VAD interrogated personally. Parameters stable.   CRITICAL CARE Performed by: Jules Oar  Total critical care time: 45 minutes  Critical care time was exclusive of separately billable procedures and treating other patients.  Critical care was necessary to treat or prevent imminent or life-threatening deterioration.  Critical care was time spent personally by me (independent of midlevel providers or residents) on the following activities: development of treatment plan with patient and/or surrogate as well as nursing, discussions with consultants, evaluation of patient's response to treatment, examination of patient, obtaining history from patient or surrogate, ordering and performing treatments and interventions, ordering and review of laboratory studies, ordering and review of radiographic studies, pulse oximetry and re-evaluation of patient's  condition.  Jules Oar, MD  5:46 PM

## 2023-09-14 NOTE — Procedures (Addendum)
 Arterial Catheter Insertion Procedure Note  DONTRELLE MAZON  [id number]  02-27-1966  Date:09/14/23  Time:6:58 PM    Provider Performing: Armstead Bertrand    Procedure: Insertion of Arterial Line (40981) with US  guidance (19147)   Indication(s) Blood pressure monitoring and/or need for frequent ABGs  Consent Risks of the procedure as well as the alternatives and risks of each were explained to the patient and/or caregiver.  Consent for the procedure was obtained and is signed in the bedside chart Discussed w/ wife placed emergently, verbal consent obtained   Anesthesia Local lidocaine    Time Out Verified patient identification, verified procedure, site/side was marked, verified correct patient position, special equipment/implants available, medications/allergies/relevant history reviewed, required imaging and test results available.   Sterile Technique Maximal sterile technique including full sterile barrier drape, hand hygiene, sterile gown, sterile gloves, mask, hair covering, sterile ultrasound probe cover (if used).   Procedure Description Area of catheter insertion was cleaned with chlorhexidine  and draped in sterile fashion. With real-time ultrasound guidance an arterial catheter was placed into the right femoral artery.  Appropriate arterial tracings confirmed on monitor.     Complications/Tolerance None; patient tolerated the procedure well. Attempted right radial approach initially. Was not able to thread GW so aborted and femoral access utilized,.  EBL Minimal   Specimen(s) None

## 2023-09-14 NOTE — Progress Notes (Signed)
 LVAD Coordinator ED Encounter  David [name] a 57 y.o. male that presented to [name] ER today due to respiratory distress. He has a past medical history  has a past medical history of AICD (automatic cardioverter/defibrillator) present, Anemia, Anxiety, CAD (coronary artery disease) (2009), CHF (congestive heart failure) (HCC), Chronic kidney disease (11/03/2016), Dysrhythmia, History of blood transfusion, HLD (hyperlipidemia), HTN (hypertension), Hyperlipidemia (03/15/2011), Ischemic cardiomyopathy, Myocardial infarction [name]), Pneumonia (2018 X 1), Seizures (HCC), and STEMI 2009 (anterior), 2010 (lateral), and 2012 (inferior) (03/14/2011).[name]   LVAD is a HMIII and was implanted on 04/20/17 by David [name] for destination therapy.   [name] paged VAD Coordinator at 1304 stating pt was having difficulty catching his breath. [name] called EMS and pt was brought to [name]. Upon arrival pt was tachypneic with a RR in the 40's. ED physician ordered Duoneb and pt was then shortly placed on Bipap. CXR obtained and reviewed with Dr. Julane [name]. Orders received for 80mg  IV Lasix  due to diffuse pulmonary edema on imaging per AHF MD.   Pt remained tachypneic on Bipap. AHF team consulted CCM and decision was made for intubation. Pt transferred to 2H09. Emotional support given to [name].  Vital signs: HR: 98 Doppler MAP:86  Automated BP: 105/70(79) O2 Sat: 100%   LVAD interrogation reveals:  Speed: 5600  Flow: 5.2 Power:  4.4w PI: 2.7  Alarms: none Events: rare; 2 PI events today  Drive Line: Existing drive line dressing CDI. Anchor secure. Dressing change due 09/15/23 by bedside nurse.  Significant Events with LVAD: 06/2023: Admission for Flu A 07/2023: Admission for respiratory distress thought to be secondary to Flu A; pt discharged home on 4L [place] and Amiodarone  stopped at discharge. 08/24/2023: ICD shock: Device interrogation showed HR 300. EGM c/w AFL sustained VT, 1 burst of ATP followed by 40J of HV  therapy converting back to AFL/VS. Amiodarone  restarted at 200mg  daily  Updated VAD Providers (Dr.Bensimhon) about the above. No LVAD issues and pump is functioning as expected.   Laurice Pope RN, BSN VAD Coordinator 24/7 Pager [phone]

## 2023-09-15 ENCOUNTER — Inpatient Hospital Stay (HOSPITAL_COMMUNITY)

## 2023-09-15 ENCOUNTER — Encounter (HOSPITAL_COMMUNITY): Admitting: Internal Medicine

## 2023-09-15 DIAGNOSIS — J9601 Acute respiratory failure with hypoxia: Secondary | ICD-10-CM | POA: Diagnosis not present

## 2023-09-15 DIAGNOSIS — J810 Acute pulmonary edema: Secondary | ICD-10-CM | POA: Diagnosis not present

## 2023-09-15 DIAGNOSIS — I5022 Chronic systolic (congestive) heart failure: Secondary | ICD-10-CM

## 2023-09-15 DIAGNOSIS — N179 Acute kidney failure, unspecified: Secondary | ICD-10-CM | POA: Diagnosis not present

## 2023-09-15 DIAGNOSIS — E875 Hyperkalemia: Secondary | ICD-10-CM | POA: Diagnosis not present

## 2023-09-15 DIAGNOSIS — E440 Moderate protein-calorie malnutrition: Secondary | ICD-10-CM | POA: Insufficient documentation

## 2023-09-15 LAB — PHOSPHORUS
Phosphorus: 4.9 mg/dL — ABNORMAL HIGH (ref 2.5–4.6)
Phosphorus: 3.6 mg/dL (ref 2.5–4.6)

## 2023-09-15 LAB — BASIC METABOLIC PANEL WITH GFR
Anion gap: 11 (ref 5–15)
Anion gap: 10 (ref 5–15)
Anion gap: 12 (ref 5–15)
BUN: 22 mg/dL — ABNORMAL HIGH (ref 6–20)
BUN: 28 mg/dL — ABNORMAL HIGH (ref 6–20)
BUN: 34 mg/dL — ABNORMAL HIGH (ref 6–20)
CO2: 22 mmol/L (ref 22–32)
CO2: 23 mmol/L (ref 22–32)
CO2: 23 mmol/L (ref 22–32)
Calcium: 9 mg/dL (ref 8.9–10.3)
Calcium: 8.8 mg/dL — ABNORMAL LOW (ref 8.9–10.3)
Calcium: 8.9 mg/dL (ref 8.9–10.3)
Chloride: 105 mmol/L (ref 98–111)
Chloride: 105 mmol/L (ref 98–111)
Chloride: 103 mmol/L (ref 98–111)
Creatinine, Ser: 2.75 mg/dL — ABNORMAL HIGH (ref 0.61–1.24)
Creatinine, Ser: 3.23 mg/dL — ABNORMAL HIGH (ref 0.61–1.24)
Creatinine, Ser: 2.94 mg/dL — ABNORMAL HIGH (ref 0.61–1.24)
GFR, Estimated: 26 mL/min — ABNORMAL LOW (ref >60)
GFR, Estimated: 21 mL/min — ABNORMAL LOW (ref >60)
GFR, Estimated: 24 mL/min — ABNORMAL LOW (ref >60)
Glucose, Bld: 154 mg/dL — ABNORMAL HIGH (ref 70–99)
Glucose, Bld: 183 mg/dL — ABNORMAL HIGH (ref 70–99)
Glucose, Bld: 153 mg/dL — ABNORMAL HIGH (ref 70–99)
Potassium: 5.8 mmol/L — ABNORMAL HIGH (ref 3.5–5.1)
Potassium: 5.1 mmol/L (ref 3.5–5.1)
Potassium: 4.1 mmol/L (ref 3.5–5.1)
Sodium: 138 mmol/L (ref 135–145)
Sodium: 138 mmol/L (ref 135–145)
Sodium: 138 mmol/L (ref 135–145)

## 2023-09-15 LAB — COOXEMETRY PANEL
Carboxyhemoglobin: 2.3 % — ABNORMAL HIGH (ref 0.5–1.5)
Carboxyhemoglobin: 2 % — ABNORMAL HIGH (ref 0.5–1.5)
Methemoglobin: <0.7 % (ref 0.0–1.5)
Methemoglobin: <0.7 % (ref 0.0–1.5)
O2 Saturation: 83 %
O2 Saturation: 81.9 %
Total hemoglobin: 11.8 g/dL — ABNORMAL LOW (ref 12.0–16.0)
Total hemoglobin: 11.1 g/dL — ABNORMAL LOW (ref 12.0–16.0)

## 2023-09-15 LAB — GLUCOSE, CAPILLARY
Glucose-Capillary: 136 mg/dL — ABNORMAL HIGH (ref 70–99)
Glucose-Capillary: 142 mg/dL — ABNORMAL HIGH (ref 70–99)
Glucose-Capillary: 144 mg/dL — ABNORMAL HIGH (ref 70–99)
Glucose-Capillary: 158 mg/dL — ABNORMAL HIGH (ref 70–99)

## 2023-09-15 LAB — PROCALCITONIN: Procalcitonin: 0.92 ng/mL

## 2023-09-15 LAB — RESPIRATORY PANEL BY PCR

## 2023-09-15 LAB — CBC
HCT: 37.9 % — ABNORMAL LOW (ref 39.0–52.0)
Hemoglobin: 11.6 g/dL — ABNORMAL LOW (ref 13.0–17.0)
MCH: 24.8 pg — ABNORMAL LOW (ref 26.0–34.0)
MCHC: 30.6 g/dL (ref 30.0–36.0)
MCV: 81 fL (ref 80.0–100.0)
Platelets: 216 10*3/uL (ref 150–400)
RBC: 4.68 MIL/uL (ref 4.22–5.81)
RDW: 16.6 % — ABNORMAL HIGH (ref 11.5–15.5)
WBC: 6.8 10*3/uL (ref 4.0–10.5)
nRBC: 0 % (ref 0.0–0.2)

## 2023-09-15 LAB — PROTIME-INR
INR: 1.7 — ABNORMAL HIGH (ref 0.8–1.2)
Prothrombin Time: 20.5 s — ABNORMAL HIGH (ref 11.4–15.2)

## 2023-09-15 LAB — MAGNESIUM
Magnesium: 1.8 mg/dL (ref 1.7–2.4)
Magnesium: 2.7 mg/dL — ABNORMAL HIGH (ref 1.7–2.4)

## 2023-09-15 LAB — ECHOCARDIOGRAM COMPLETE
Est EF: <20
Height: 65 in
S' Lateral: 5.9 cm
Weight: 1950.63 [oz_av]

## 2023-09-15 LAB — LACTATE DEHYDROGENASE: LDH: 211 U/L — ABNORMAL HIGH (ref 98–192)

## 2023-09-15 LAB — SEDIMENTATION RATE: Sed Rate: 37 mm/h — ABNORMAL HIGH (ref 0–16)

## 2023-09-15 LAB — LACTIC ACID, PLASMA
Lactic Acid, Venous: 2.3 mmol/L (ref 0.5–1.9)
Lactic Acid, Venous: 1.5 mmol/L (ref 0.5–1.9)

## 2023-09-15 MED ORDER — SODIUM ZIRCONIUM CYCLOSILICATE 5 G PO PACK
5.0000 g | PACK | Freq: Once | ORAL | Status: AC
  Administered 2023-09-15: 5 g
  Filled 2023-09-15: qty 1

## 2023-09-15 MED ORDER — MIDAZOLAM HCL 2 MG/2ML IJ SOLN
INTRAMUSCULAR | Status: AC
  Administered 2023-09-15: 2 mg via INTRAVENOUS
  Filled 2023-09-15: qty 2

## 2023-09-15 MED ORDER — INSULIN ASPART 100 UNIT/ML IJ SOLN
2.0000 [IU] | INTRAMUSCULAR | Status: DC
  Administered 2023-09-15: 4 [IU] via SUBCUTANEOUS
  Administered 2023-09-15 – 2023-09-16 (×3): 2 [IU] via SUBCUTANEOUS
  Administered 2023-09-16 (×3): 4 [IU] via SUBCUTANEOUS
  Administered 2023-09-16: 2 [IU] via SUBCUTANEOUS
  Administered 2023-09-16: 4 [IU] via SUBCUTANEOUS
  Administered 2023-09-17 (×3): 2 [IU] via SUBCUTANEOUS

## 2023-09-15 MED ORDER — FUROSEMIDE 10 MG/ML IJ SOLN
160.0000 mg | Freq: Once | INTRAVENOUS | Status: AC
  Administered 2023-09-15: 160 mg via INTRAVENOUS
  Filled 2023-09-15: qty 10

## 2023-09-15 MED ORDER — VITAL 1.5 CAL PO LIQD
1000.0000 mL | ORAL | Status: DC
  Administered 2023-09-15: 1000 mL

## 2023-09-15 MED ORDER — WARFARIN SODIUM 1 MG PO TABS: 1.0000 mg | ORAL_TABLET | Freq: Once | ORAL | Status: DC

## 2023-09-15 MED ORDER — PROSOURCE TF20 ENFIT COMPATIBL EN LIQD
60.0000 mL | Freq: Every day | ENTERAL | Status: DC
  Administered 2023-09-15 – 2023-09-16 (×2): 60 mL
  Filled 2023-09-15 (×2): qty 60

## 2023-09-15 MED ORDER — FENTANYL CITRATE PF 50 MCG/ML IJ SOSY: 50.0000 ug | PREFILLED_SYRINGE | Freq: Once | INTRAMUSCULAR | Status: AC

## 2023-09-15 MED ORDER — WARFARIN SODIUM 1 MG PO TABS
1.0000 mg | ORAL_TABLET | Freq: Once | ORAL | Status: AC
  Administered 2023-09-15: 1 mg
  Filled 2023-09-15: qty 1

## 2023-09-15 MED ORDER — ATORVASTATIN CALCIUM 40 MG PO TABS
40.0000 mg | ORAL_TABLET | Freq: Every day | ORAL | Status: DC
  Administered 2023-09-15 – 2023-09-16 (×2): 40 mg
  Filled 2023-09-15 (×2): qty 1

## 2023-09-15 MED ORDER — MIDAZOLAM HCL 2 MG/2ML IJ SOLN: 2.0000 mg | Freq: Once | INTRAMUSCULAR | Status: AC

## 2023-09-15 MED ORDER — SODIUM CHLORIDE 3 % IN NEBU
4.0000 mL | INHALATION_SOLUTION | Freq: Two times a day (BID) | RESPIRATORY_TRACT | Status: AC
  Administered 2023-09-15 – 2023-09-17 (×6): 4 mL via RESPIRATORY_TRACT
  Filled 2023-09-15 (×6): qty 4

## 2023-09-15 MED ORDER — FUROSEMIDE 10 MG/ML IJ SOLN
160.0000 mg | Freq: Once | INTRAVENOUS | Status: AC
  Administered 2023-09-15: 160 mg via INTRAVENOUS
  Filled 2023-09-15: qty 16

## 2023-09-15 MED ORDER — VITAL HIGH PROTEIN PO LIQD
1000.0000 mL | ORAL | Status: DC
Start: 2023-09-15 — End: 2023-09-15

## 2023-09-15 MED ORDER — FUROSEMIDE 10 MG/ML IJ SOLN
40.0000 mg/h | INTRAVENOUS | Status: DC
Start: 2023-09-15 — End: 2023-09-19
  Administered 2023-09-15: 20 mg/h via INTRAVENOUS
  Administered 2023-09-15 – 2023-09-18 (×12): 40 mg/h via INTRAVENOUS
  Administered 2023-09-18: 30 mg/h via INTRAVENOUS
  Administered 2023-09-18: 40 mg/h via INTRAVENOUS
  Administered 2023-09-18 – 2023-09-19 (×2): 30 mg/h via INTRAVENOUS
  Filled 2023-09-15 (×18): qty 20

## 2023-09-15 MED ORDER — WARFARIN - PHARMACIST DOSING INPATIENT: Freq: Every day | Status: DC

## 2023-09-15 MED ORDER — THIAMINE MONONITRATE 100 MG PO TABS
100.0000 mg | ORAL_TABLET | Freq: Every day | ORAL | Status: DC
  Administered 2023-09-15 – 2023-09-16 (×2): 100 mg
  Filled 2023-09-15 (×2): qty 1

## 2023-09-15 MED ORDER — FENTANYL CITRATE PF 50 MCG/ML IJ SOSY
PREFILLED_SYRINGE | INTRAMUSCULAR | Status: AC
  Administered 2023-09-15: 50 ug via INTRAVENOUS
  Filled 2023-09-15: qty 2

## 2023-09-15 MED ORDER — MAGNESIUM SULFATE 2 GM/50ML IV SOLN
2.0000 g | Freq: Once | INTRAVENOUS | Status: AC
Start: 2023-09-15 — End: 2023-09-15
  Administered 2023-09-15: 2 g via INTRAVENOUS
  Filled 2023-09-15: qty 50

## 2023-09-15 MED ORDER — FENTANYL BOLUS VIA INFUSION: 50.0000 ug | INTRAVENOUS | Status: DC | PRN

## 2023-09-15 MED ORDER — FENTANYL BOLUS VIA INFUSION
50.0000 ug | Freq: Once | INTRAVENOUS | Status: AC
  Administered 2023-09-15: 50 ug via INTRAVENOUS
  Filled 2023-09-15: qty 50

## 2023-09-15 MED ORDER — METOLAZONE 5 MG PO TABS
5.0000 mg | ORAL_TABLET | Freq: Once | ORAL | Status: AC
Start: 2023-09-15 — End: 2023-09-15
  Administered 2023-09-15: 5 mg
  Filled 2023-09-15: qty 1

## 2023-09-15 NOTE — Progress Notes (Signed)
 NAME:  David [name], MRN:  [id number], DOB:  07-21-1965, LOS: 1 ADMISSION DATE:  09/14/2023, CONSULTATION DATE:  5/22 REFERRING MD:  Bensimhon, CHIEF COMPLAINT:  resp failure   History of Present Illness:  This is a 58 year old male w/ advanced systolic HF, CAD, HTn, CKD stage IIIA. He has been on HM III LVAD (which is his destination d/t CKD) since 2019. Last hospital stay April 2025 for acute HF, prob sepsis, also complicated by persistent hypoxia felt possibly mixed picture of PNA  (felt influenza A PNA w/ post viral PNA) vs amidarone toxicity. During this stay diuresed 15lbs fluid off. Speed of VAD increased and sent home on oxygen.  Followed at [facility] clinic for coumadin  dosing and LVAD clinic post discharge in  April at HF clinic: O2 sats 4 lpm 95% and 92-93% on room air.  5/2 VAD clinic eval as felt had shock by ICD. Event eval showed AFL sustained VT, 1 burst of ATP followed by 40J of HV therapy converting back to AFL/VS." See EP RN note for further details. Amiodarone  resumed.   5/20 nursing calling HF clinic reporting increased exertional shortness of breath, advised to inc Oxygen from 2 to 4 lpm. Clinic f/u scheduled for 5/23 Presents to ER 5/24  5/22 presented to ER w/ worsening SOB, NP dry cough, wt up 2 lbs. + orthopnea. Pulse ox 86% on 2 lpm RR 40s. Marked distress SOB worse in ER. W/ progressive hypoxia. PCXR w/ diffuse pulmonary edema.  Placed on NIPPV, w/ no improvement so PCCM called by HF team for intubation and Critical care support.  Pertinent  Medical History  HFrEF s/p HMIII LVAD implant 2019, cardiogenic shock,recent hospital stay for acute HF and presumed infection, during this stay VAD speed increased to 5600, (last op visit VAD speed 5600, flow 5.3) chronic AC w/ warfarin, gastric AVMs, afib, prior DC/CV, ICD,SAH, HTN, urinary retention, concern for amiodarone  toxicity also during this stay, CKD stage 3a. Prior GIB off ASA  Significant Hospital Events: Including  procedures, antibiotic start and stop dates in addition to other pertinent events   5/22 admitted w/ acute on chronic HF   Interim History / Subjective:    Cr 2.75, K 5.8 PCT 0.9 Modest UOP after multipl doses of 80 lasix  -- <350 cc   Objective    Blood pressure 95/78, pulse 88, temperature 98.3 F (36.8 C), temperature source Oral, resp. rate (!) 22, height 5\' 5"  (1.651 m), weight 55.3 kg, SpO2 98%. CVP:  [12 mmHg-20 mmHg] 19 mmHg  Vent Mode: PRVC FiO2 (%):  [40 %-100 %] 40 % Set Rate:  [20 bmp] 20 bmp Vt Set:  [490 mL] 490 mL PEEP:  [5 cmH20-8 cmH20] 8 cmH20 Plateau Pressure:  [26 cmH20] 26 cmH20   Intake/Output Summary (Last 24 hours) at 09/15/2023 1121 Last data filed at 09/15/2023 0700 Gross per 24 hour  Intake 698.21 ml  Output 351 ml  Net 347.21 ml   Filed Weights   09/15/23 0458  Weight: 55.3 kg    Examination: General: critically and chronically ill middle aged M NAD  Neuro: lightly sedated, awakens and follows commands  HENT: NCAT ETT secure Lungs: Lung sounds obscured by LVAD hum  Cardiovascular: LVAD hum  Abdomen: soft ndnt  Extremities: no obvious acute joint deformity no cyanosis or clubbing +edema  GU: foley with concentrated appearing urine   Resolved problem list   Assessment and Plan    AoC systolic HF HM III LVAD in situ  Persistent Afib  Hx VT ICD in situ Hx CAD Hx HTN  -coox 5/23 is 83 not on vasoactive meds Plan -Adv HF is primary -LVAD per HF  -ECHO is being performed 5/23 -diuretic challenge 5/23 w lasix  gtt + 5 metolazone   -follow cvp, coox -pharmD dosing warfarin, goal INR 1.7 -will start his home statin  Possible sepsis 2/2 PNA  Acute resp failure w hypoxia  Pulm edema vs PNA vs amio lung tox  -RVP neg. possible bacterial PNA and is on abx for such. PCT is 0.9 which is not hugely impressive in context of AKI on CKD. UTI possible and may not bump a robust PCT, UA not obtained and now w foley so will defer. On rocephin  as  part of cap coverage which would provide good coverage for uti    -amio lung tox is possible but a slam dunk-- was recently restarted on it 5/1 after VT/ICD shock (was stopped in April 2025 after RHC w c/f amio tox at which time ESR was ok), ESR CRP modestly elevated now after steroids started so possibly confounded a bit   P -cont MV support -cont CAP coverage -trach asp pending -lasix  gtt + metolazone  5/23  -holding amio -is on solumedrol for possible amio lung tox -after volume status optimized, consider CT chest. Not sure it would be fruitful while still wet.   AKI on CKD 3a Hyperkalemia  Hx urinary retention  Plan -lasix  gtt + 5 metolazone  5/23 -5mg  lokelma  -afternoon and AM BMP  -cont foley, strict I/O -home flowmax is on hold / can't be given per tube  Hx SAH -supportive care   Best Practice (right click and "Reselect all SmartList Selections" daily)   Diet/type: NPO DVT prophylaxis Coumadin  Pressure ulcer(s): N/A GI prophylaxis: PPI Lines: Central line, Arterial Line, and yes and it is still needed Foley:  Yes, and it is still needed Code Status:  limited no compressions has LVAD Last date of multidisciplinary goals of care discussion [per primary ]  Labs   CBC: Recent Labs  Lab 09/14/23 1434 09/14/23 1852 09/15/23 0450  WBC 13.9*  --  6.8  NEUTROABS 11.6*  --   --   HGB 12.9* 13.3 11.6*  HCT 41.7 39.0 37.9*  MCV 81.9  --  81.0  PLT 285  --  216    Basic Metabolic Panel: Recent Labs  Lab 09/14/23 1434 09/14/23 1647 09/14/23 1852 09/15/23 0450  NA 139  --  141 138  K 4.4  --  4.2 5.8*  CL 104  --   --  105  CO2 21*  --   --  22  GLUCOSE 121*  --   --  154*  BUN 15  --   --  22*  CREATININE 2.19*  --   --  2.75*  CALCIUM  9.4  --   --  9.0  MG  --  1.6*  --   --    GFR: Estimated Creatinine Clearance: 22.6 mL/min (A) (by C-G formula based on SCr of 2.75 mg/dL (H)). Recent Labs  Lab 09/14/23 1434 09/14/23 1731 09/14/23 1947 09/15/23 0450   PROCALCITON  --   --   --  0.92  WBC 13.9*  --   --  6.8  LATICACIDVEN  --  2.5* 2.3*  --     Liver Function Tests: Recent Labs  Lab 09/14/23 1434  AST 36  ALT 21  ALKPHOS 120  BILITOT 1.9*  PROT 8.2*  ALBUMIN  2.9*  Recent Labs  Lab 09/14/23 1434  LIPASE 33   No results for input(s): "AMMONIA" in the last 168 hours.  ABG    Component Value Date/Time   PHART 7.358 09/14/2023 1852   PCO2ART 43.2 09/14/2023 1852   PO2ART 211 (H) 09/14/2023 1852   HCO3 24.3 09/14/2023 1852   TCO2 26 09/14/2023 1852   ACIDBASEDEF 1.0 09/14/2023 1852   O2SAT 83 09/15/2023 0756     Coagulation Profile: Recent Labs  Lab 09/11/23 0000 09/14/23 1530 09/15/23 0450  INR 2.2 1.7* 1.7*    Cardiac Enzymes: No results for input(s): "CKTOTAL", "CKMB", "CKMBINDEX", "TROPONINI" in the last 168 hours.  HbA1C: Hgb A1c MFr Bld  Date/Time Value Ref Range Status  04/19/2017 07:28 PM 6.1 (H) 4.8 - 5.6 % Final    Comment:    (NOTE) Pre diabetes:          5.7%-6.4% Diabetes:              >6.4% Glycemic control for   <7.0% adults with diabetes   03/13/2017 11:30 AM 6.1 (H) 4.8 - 5.6 % Final    Comment:    (NOTE) Pre diabetes:          5.7%-6.4% Diabetes:              >6.4% Glycemic control for   <7.0% adults with diabetes     CBG: Recent Labs  Lab 09/14/23 2358  GLUCAP 142*   CRITICAL CARE Performed by: Delories Fetter   Total critical care time: 44 minutes  Critical care time was exclusive of separately billable procedures and treating other patients.  Critical care was necessary to treat or prevent imminent or life-threatening deterioration.  Critical care was time spent personally by me on the following activities: development of treatment plan with patient and/or surrogate as well as nursing, discussions with consultants, evaluation of patient's response to treatment, examination of patient, obtaining history from patient or surrogate, ordering and performing treatments  and interventions, ordering and review of laboratory studies, ordering and review of radiographic studies, pulse oximetry and re-evaluation of patient's condition.   Eston Hence MSN, AGACNP-BC  Pulmonary/Critical Care Medicine Amion for pager 09/15/2023, 11:21 AM

## 2023-09-15 NOTE — Procedures (Signed)
 Bronchoscopy Procedure Note  David [name]  [id number]  1965/07/01  Date:09/15/23  Time:11:47 AM   Provider Performing:Pete E Burna Carrier   Procedure(s):  Initial Therapeutic Aspiration of Tracheobronchial Tree [phone])  Indication(s) Called emergently to bedside. Pt PIPs elevated Pulse ox low 80s. Diaphoretic and in distress.   Consent Unable to obtain consent due to emergent nature of procedure.  Anesthesia Premedicated w/ versed  and fent   Time Out Verified patient identification, verified procedure, site/side was marked, verified correct patient position, special equipment/implants available, medications/allergies/relevant history reviewed, required imaging and test results available.   Sterile Technique Usual hand hygiene, masks, gowns, and gloves were used   Procedure Description Bronchoscope advanced through endotracheal tube and into airway.  Airways were examined down to subsegmental level with findings noted below.   Following diagnostic evaluation, Therapeutic aspiration performed in the endodtracheal tube which was obstructed w/ thick secretions requiring lavage to clear.   Findings: distal endotracheal tube almost completely obstructed w/ thick dry mucous which I was able to remove w/ lavage and sxn. Advancing tube further noted that the distal tube was abutting the carina so we pulled ETT back from 26 to 24 cm. I then inspected RUL/RML/RLL there were clear secretions but no purulence of blood. The Left upper and lower were similar.  At completion of procedure pt able to get complete volumes. ETT in satisfactory position and appeared much more comfortable    Complications/Tolerance None; patient tolerated the procedure well. Chest X-ray is not needed post procedure.   EBL Minimal   Specimen(s) Not sent

## 2023-09-15 NOTE — Progress Notes (Signed)
 PHARMACY - ANTICOAGULATION CONSULT NOTE  Pharmacy Consult for warfarin Indication: LVAD  Allergies  Allergen Reactions   Plavix [Clopidogrel Bisulfate] Hives    Patient Measurements: Height: 5\' 5"  (165.1 cm) Weight: 55.3 kg (121 lb 14.6 oz) (Reweighed with bed several times, does not appear to be accurate) IBW/kg (Calculated) : 61.5  Vital Signs: Temp: 98.3 F (36.8 C) (05/23 0800) Temp Source: Oral (05/23 0800) BP: 95/78 (05/23 0400) Pulse Rate: 88 (05/23 0700)  Labs: Recent Labs    09/14/23 1434 09/14/23 1530 09/14/23 1647 09/14/23 1852 09/15/23 0450  HGB 12.9*  --   --  13.3 11.6*  HCT 41.7  --   --  39.0 37.9*  PLT 285  --   --   --  216  LABPROT  --  20.1*  --   --  20.5*  INR  --  1.7*  --   --  1.7*  CREATININE 2.19*  --   --   --  2.75*  TROPONINIHS 23*  --  21*  --   --     Estimated Creatinine Clearance: 22.6 mL/min (A) (by C-G formula based on SCr of 2.75 mg/dL (H)).   Medical History: Past Medical History:  Diagnosis Date   AICD (automatic cardioverter/defibrillator) present    Anemia    Anxiety    CAD (coronary artery disease) 2009   AMI in 12/2007 with PCI to LAD, staged PCI to  mid/distal RCA, NSTEMI in 02/2009 with BMS to LCx   CHF (congestive heart failure) (HCC)    Chronic kidney disease 11/03/2016   stage 3 kidney disease   Dysrhythmia    "arrythmia problems at some point", "fatal rhythms"   History of blood transfusion    "I was bleeding from was rectum" (04/19/2017)   HLD (hyperlipidemia)    HTN (hypertension)    Hyperlipidemia 03/15/2011   Ischemic cardiomyopathy    Admitted in 07/2010 with CHF exacerbation   Myocardial infarction (HCC)    "I've had 4" (04/19/2017)   Pneumonia 2018 X 1   Seizures (HCC)    one seizure in 04/2016 during cardiac event (04/19/2017)   STEMI 2009 (anterior), 2010 (lateral), and 2012 (inferior) 03/14/2011     Assessment: 57 yoM with HM3 LVAD on warfarin PTA admitted with hypoxia requiring  intubation. Pharmacy to manage warfarin. INR goal 1.5-2, today is 1.7. CBC ok, LDH stable. Home dose is 1mg  daily.  Goal of Therapy:  INR 1.5-2 Monitor platelets by anticoagulation protocol: Yes   Plan:  Warfarin 1mg  per tube x1 tonight Daily INR  Levin Reamer, PharmD, BCPS, [facility] Clinical Pharmacist [phone] Please check AMION for all [facility] Pharmacy numbers 09/15/2023

## 2023-09-15 NOTE — Progress Notes (Signed)
 LVAD Coordinator Rounding Note:  Admitted 09/13/23 from [facility] for acute respiratory distress. Pt reported increased bloating and SOB since Tuesday. CXR showed diffuse pulmonary edema on admission. Pt placed on Bipap and given IV Lasix  without success. Pt intubated in ED by CCM.  HM3 LVAD implanted on 04/20/17 by Dr Sherene Dilling under destination therapy criteria.  Pt intubated and sedated this morning. Able to follow commands. CVP 20 this morning with low UOP from IV Lasix  last night. CR up to 2.75. Diuretic regimen increased per AHF team.  Vital signs: Temp: 98.3 HR: 86 Doppler Pressure: none documented Automatic BP: none documented Arterial BP: 95/84 (89) O2 Sat: 98% on 40% FiO2 Wt: bed weight not accurate today pt typically ranges between 155-163lbs  LVAD interrogation :  Speed: 5600 Flow: 5.2 Power: 4.4 w PI: 2.7   Alarms: none Events:  none Hematocrit: 37  Fixed speed: 5600 Low speed limit: 5300  Drive Line: Existing VAD dressing removed and site care performed using sterile technique. Drive line exit site cleaned with Chlora prep applicators x 2, allowed to dry, and Sorbaview dressing with Silverlon patch applied. Exit site healed and incorporated, the velour is fully implanted at exit site. No redness, tenderness, drainage, foul odor or rash noted. Drive line anchor re-applied. Next dressing change 09/22/23 by bedside nurse.  Labs:  LDH trend: 373>211  INR trend: 1.7>1.7  Cr trend: 2.19>2.75  WBC trend: 13.9>6.8  Anticoagulation Plan: -INR Goal: 1.5-2.0 -ASA Dose: none - Coumadin : ongoing  Device: -Medtronic BiV -Therapies: ON  Arrythmias: Hx Afib/AF- on Amiodarone  200 mg daily- restarted 08/25/23 following ICD shock  Plan/Recommendations:  1. Page VAD coordinator with any drive line or equipment concerns 2. Weekly drive line dressing changes by bedside RN  Laurice Pope RN,BSN VAD Coordinator  Office: [phone]  24/7 Pager: [phone]

## 2023-09-15 NOTE — Progress Notes (Signed)
 Nutrition Follow-up  DOCUMENTATION CODES:   Non-severe (moderate) malnutrition in context of chronic illness  INTERVENTION:   Tube Feeding via OG:  Vital 1.5 at 50 ml/hr Begin TF at rate of 20 ml/hr, titrate by 10 mL q 8 hours until goal rate of 50 ml/hr Pro-Source TF20 60 mL daily TF at goal provides 1880 kcals, 101 g of protein and 912 mL of free water  Add Thiamine 100 mg daily per tube x 7 days  Requesting repeat weight; given significant difference between hospital weight and reported outpt weight and previous wt encounters  NUTRITION DIAGNOSIS:   Moderate Malnutrition related to chronic illness as evidenced by mild fat depletion, mild muscle depletion.  GOAL:   Patient will meet greater than or equal to 90% of their needs  MONITOR:   Vent status, TF tolerance, Weight trends, Labs, I & O's  REASON FOR ASSESSMENT:   Consult Enteral/tube feeding initiation and management  ASSESSMENT:   58 yo admitted with severe systolic HF, AKI on CKD 3, respiratory failure requiring intubation. PMH includes chronic systolic HF with HM3 LVAD implant 2019, gastric AVMs, hx GI bleed, ICD, SAH, HTN, CKD 3  5/22 Admitted,Intubated 5/23 Bronch this AM, ETT obstructed with thick dry mucous  Pt remains on vent support, fentanyl  gtt.  LVAD in place, INR 1.7 (goal 1.5-2.0)  OG tube with tip entering stomach per chest xray report this AM Last BM PTA, abd soft, BS present  Wife, David [name], at bedside and reports pt eating minimally since Monday. Pt "pushing food away," reported food tasted good but just could not get anything down. No N/V per wife. Prior to this, pt eating normally for him with good appetite. Pt typically eats 2 meals per day and snacks/grazes all day in between. Pt eats Breakfast and Dinner, which usually vary.   Wife reports pt's weight has been very stable, he weighs himself regularly and even writes on the comments that the weight includes his "hardware" from LVAD (battery  pack etc)  UBW between 160s-170 per wife. Current wt documented as 122 pounds (55.3 kg); plan to re-weight pt to ensure accuracy. Noted wt of 74.9 kg the beginning of this month. If weights are accurate, pt has experienced weight loss  Noted pt with multiple recent hospital admissions this year. 2 day admission in January, admission in March followed by 8 day admission in April  At baseline, wife reports pt is up and moving around the house often but loves to sit and watch TV. Over the last week, Pt has been less active with progressive SOB, weakness.  Noted minimal UOP, <100 mL so far today, 220 mL overnight. Noted lasix  gtt. Creatinine worsening, up 50 3.23. Hyperkalemia this AM, treated with lokelma  Baseline Creatinine 1.5-1.8  Labs: Lactic Acid 1.5 (wdl) Sodium 138 (wdl) Potassium 5.1 (wdl) BUN 28 Creatinine 3.23 (H) Phosphorus 4.9 (H) Magnesium  1.8 (wdl)  Meds:  Solumedrol Lokelma  x 1 Coumadin   Lasix  gtt   NUTRITION - FOCUSED PHYSICAL EXAM:  Flowsheet Row Most Recent Value  Orbital Region Mild depletion  Upper Arm Region Mild depletion  Thoracic and Lumbar Region Mild depletion  Buccal Region Unable to assess  Temple Region Mild depletion  Clavicle Bone Region Mild depletion  Clavicle and Acromion Bone Region Mild depletion  Scapular Bone Region Mild depletion  Dorsal Hand Mild depletion  Patellar Region Mild depletion  Anterior Thigh Region Mild depletion  Posterior Calf Region Mild depletion  Edema (RD Assessment) None  Diet Order:   Diet Order             Diet NPO time specified  Diet effective now                   EDUCATION NEEDS:   Not appropriate for education at this time  Skin:  Skin Assessment: Skin Integrity Issues: Skin Integrity Issues:: Incisions Incisions: driveline for LVAD (present on admission)  Last BM:  PTA  Height:   Ht Readings from Last 1 Encounters:  09/14/23 5\' 5"  (1.651 m)    Weight:   Wt Readings from  Last 1 Encounters:  09/15/23 55.3 kg    BMI:  Body mass index is 20.29 kg/m.  Estimated Nutritional Needs:   Kcal:  1800-2000 kcals  Protein:  85-100 g  Fluid:  1.8 L   David Beer MS, RDN, LDN, CNSC Registered Dietitian 3 Clinical Nutrition RD Inpatient Contact Info in Amion

## 2023-09-15 NOTE — TOC Initial Note (Signed)
 Transition of Care [facility]) - Initial/Assessment Note    Patient Details  Name: David [name] MRN: [id number] Date of Birth: Apr 01, 1966  [facility] CM/SW Contact:    Ernst Heap Phone Number: [phone] 09/15/2023, 2:33 PM  Clinical Narrative:  2:32 PM- HF CSW left a VM with the patients spouse. Will continue to make attempts to check-in.   TOC will continue following.                        Patient Goals and CMS Choice            Expected Discharge Plan and Services                                              Prior Living Arrangements/Services                       Activities of Daily Living      Permission Sought/Granted                  Emotional Assessment   Attitude/Demeanor/Rapport: Intubated (Following Commands or Not Following Commands)          Admission diagnosis:  Acute respiratory failure with hypoxia (HCC) [J96.01] Heart failure (HCC) [I50.9] Patient Active Problem List   Diagnosis Date Noted   Heart failure (HCC) 09/14/2023   Acute respiratory failure with hypoxia (HCC) 09/14/2023   Pneumonia 08/03/2023   SOB (shortness of breath) 07/10/2023   Malfunction of implantable defibrillator ventricular (ICD) lead 05/24/2023   ICD (implantable cardioverter-defibrillator) lead failure 05/24/2023   Failure of implantable cardioverter-defibrillator (ICD) lead 05/24/2023   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 08/30/2022   SAH (subarachnoid hemorrhage) (HCC)    LVAD (left ventricular assist device) present (HCC)    ICH (intracerebral hemorrhage) (HCC) 06/05/2021   Left ventricular assist device present (HCC) 06/08/2017   Gastric AVM    Presence of left ventricular assist device (LVAD) (HCC) 04/20/2017   Palliative care encounter    Ischemic cardiomyopathy 11/07/2016   CKD (chronic kidney disease), stage III (HCC) 05/13/2016   Acute hypoxic respiratory failure (HCC)  05/13/2016   PAF (paroxysmal atrial fibrillation) (HCC)    Acute on chronic systolic heart failure (HCC) 03/15/2011   PCP:  Mina Alter, FNP Pharmacy:   [facility] [address] [place] - [address] [place] [address] Phone: [phone] Fax: [phone]  [facility] [address] [place] [address] Phone: [phone] Fax: [phone]     Social Drivers of Health (SDOH) Social History: SDOH Screenings   Food Insecurity: No Food Insecurity (08/03/2023)  Recent Concern: Food Insecurity - Food Insecurity Present (05/24/2023)  Housing: Low Risk  (08/03/2023)  Transportation Needs: No Transportation Needs (08/03/2023)  Utilities: Not At Risk (08/03/2023)  Recent Concern: Utilities - At Risk (05/24/2023)  Depression (PHQ2-9): High Risk (07/19/2021)  Financial Resource Strain: High Risk (07/04/2022)  Physical Activity: Inactive (03/10/2017)  Stress: No Stress Concern Present (03/10/2017)  Tobacco Use: Medium Risk (09/14/2023)   SDOH Interventions:     Readmission Risk Interventions    08/11/2023    1:58 PM  Readmission Risk Prevention Plan  Transportation Screening Complete  PCP or Specialist Appt within 5-7 Days Complete  Home Care Screening Complete  Medication Review (RN CM) Complete

## 2023-09-15 NOTE — Progress Notes (Signed)
 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   Several recent admits for respiratory distress, initially with flu A then with HF/post-viral syndrome.   At last admit concern for amio toxicity but ESR ok. Amio stopped but restarted 08/25/23 after ICD shock for VT.   Now readmitted w/ several days progressive/severe SOB. In ER in extremis. CXR with diffuse bilateral infiltrates. Intubated by CCM.  Subjective/Interval Hx:    Intubated awake on vent and following commands, FiO2 40%    ESR 31, CRP 3.9 (checked prior to initiation of steroids)  Respiratory Panel negative PCT 0.92 Strep Pneumo, Legionella both negative   LA 2.5>>2.3>>pending BNP 408   On IV solumedrol +ceftriaxone   Poor response to 80 IV Lasix , only 350 cc in UOP. SCr 2.19>>2.75. K 5.8.   CVP 20    Co-ox 83%. No current inotropic/pressor support    LVAD INTERROGATION:  HeartMate III LVAD:   Flow 5.0 liters/min, speed 5600, power 4.0, PI 2.9.    Objective:    Vital Signs:   Temp:  [98.1 F (36.7 C)-99.7 F (37.6 C)] 98.3 F (36.8 C) (05/23 0800) Pulse Rate:  [37-113] 88 (05/23 0700) Resp:  [11-42] 22 (05/23 0822) BP: (91-131)/(68-119) 95/78 (05/23 0400) SpO2:  [97 %-100 %] 98 % (05/23 0700) Arterial Line BP: (79-104)/(68-92) 91/83 (05/23 0700) FiO2 (%):  [40 %-100 %] 40 % (05/23 0822) Weight:  [55.3 kg] 55.3 kg (05/23 0458) Last BM Date :  (PTA) Mean arterial Pressure 80s  Intake/Output:   Intake/Output Summary (Last 24 hours) at 09/15/2023 0936 Last data filed at 09/15/2023 0700 Gross per 24 hour  Intake 698.21 ml  Output 351 ml  Net 347.21 ml     Physical Exam   CVP 20  General:  intubated, awake on vent and following commands  HEENT: + ETT  Neck: + RIJ CVC, JVD elevated to jaw  Cor: Mechanical  heart sounds with LVAD hum present. Lungs: intubated, anterior ausculation decreased BS at the bases. No wheezing  Abdomen: soft, nontender, nondistended.  Driveline: C/D/I; securement device intact and driveline incorporated Extremities: no cyanosis, clubbing, rash, no edema Neuro: intubated. Awake on vent. Follows commands  GU: + foley    Telemetry   Atrial fibrillation, 70s. Personally reviewed   EKG    N/A   Labs   Basic Metabolic Panel: Recent Labs  Lab 09/14/23 1434 09/14/23 1647 09/14/23 1852 09/15/23 0450  NA 139  --  141 138  K 4.4  --  4.2 5.8*  CL 104  --   --  105  CO2 21*  --   --  22  GLUCOSE 121*  --   --  154*  BUN 15  --   --  22*  CREATININE 2.19*  --   --  2.75*  CALCIUM  9.4  --   --  9.0  MG  --  1.6*  --   --     Liver Function Tests: Recent Labs  Lab 09/14/23 1434  AST 36  ALT 21  ALKPHOS 120  BILITOT 1.9*  PROT 8.2*  ALBUMIN  2.9*   Recent Labs  Lab 09/14/23 1434  LIPASE 33   No results for input(s): "AMMONIA" in the last 168 hours.  CBC: Recent Labs  Lab 09/14/23 1434 09/14/23 1852  09/15/23 0450  WBC 13.9*  --  6.8  NEUTROABS 11.6*  --   --   HGB 12.9* 13.3 11.6*  HCT 41.7 39.0 37.9*  MCV 81.9  --  81.0  PLT 285  --  216    INR: Recent Labs  Lab 09/11/23 0000 09/14/23 1530 09/15/23 0450  INR 2.2 1.7* 1.7*    Other results: EKG:    Imaging   DG Chest Port 1 View Result Date: 09/14/2023 CLINICAL DATA:  Check central line placement EXAM: PORTABLE CHEST 1 VIEW COMPARISON:  Film from earlier in the same day. FINDINGS: Cardiac shadow is enlarged. LVAD is again seen in place. Defibrillator is noted. Endotracheal tube is noted 1 cm above the carina. The gastric catheter extends into the stomach. Right jugular central line is seen extending to the cavoatrial junction. No pneumothorax is noted. Lungs are well aerated diffuse airspace opacity stable from the prior exam likely representing edema. No bony abnormality is  seen. IMPRESSION: Tubes and lines as described above. No evidence of pneumothorax following central line placement. Electronically Signed   By: Violeta Grey M.D.   On: 09/14/2023 20:13   DG Chest Port 1 View Result Date: 09/14/2023 CLINICAL DATA:  Respiratory failure EXAM: PORTABLE CHEST 1 VIEW COMPARISON:  Radiograph earlier today FINDINGS: Endotracheal tube tip 17 mm from the carina. Tip and side port of the enteric tube below the diaphragm in the stomach. Left-sided pacemaker in place. Left ventricular assist device in place. Previous median sternotomy. Cardiomegaly is grossly stable. Diffuse interstitial and heterogeneous bilateral lung opacities without significant interval change from earlier today. There may be small pleural effusions. No pneumothorax. IMPRESSION: 1. Endotracheal tube tip 17 mm from the carina. Enteric tube in the stomach. 2. Diffuse interstitial and heterogeneous bilateral lung opacities without significant interval change from earlier today, edema versus infection. Stable cardiomegaly. Electronically Signed   By: Chadwick Colonel M.D.   On: 09/14/2023 18:45   DG Chest Portable 1 View Result Date: 09/14/2023 CLINICAL DATA:  Shortness of breath, cough. EXAM: PORTABLE CHEST 1 VIEW COMPARISON:  August 03, 2023. FINDINGS: Stable cardiomegaly. Left-sided defibrillator is unchanged. Left ventricular assist device is unchanged. Increased diffuse reticulonodular densities are noted concerning for edema or possibly pneumonia. Bony thorax is unremarkable. IMPRESSION: Stable support apparatus as noted above. Increased bilateral diffuse lung opacities are noted concerning for edema or possibly pneumonia. Electronically Signed   By: Rosalene Colon M.D.   On: 09/14/2023 16:05     Medications:     Scheduled Medications:  Chlorhexidine  Gluconate Cloth  6 each Topical Daily   famotidine   20 mg Per Tube BID   methylPREDNISolone (SOLU-MEDROL) injection  60 mg Intravenous Q12H   metolazone   5  mg Per Tube Once   mouth rinse  15 mL Mouth Rinse Q2H   pantoprazole  (PROTONIX ) IV  40 mg Intravenous Daily   revefenacin  175 mcg Nebulization Daily   sodium zirconium cyclosilicate   5 g Per Tube Once    Infusions:  sodium chloride  Stopped (09/15/23 0541)   cefTRIAXone  (ROCEPHIN )  IV Stopped (09/14/23 2057)   doxycycline  (VIBRAMYCIN ) IV 125 mL/hr at 09/15/23 0700   fentaNYL  infusion INTRAVENOUS 75 mcg/hr (09/15/23 0700)   furosemide      furosemide  (LASIX ) 200 mg in dextrose  5 % 100 mL (2 mg/mL) infusion     norepinephrine  (LEVOPHED ) Adult infusion Stopped (09/14/23 1732)    PRN Medications: acetaminophen , fentaNYL , ipratropium-albuterol , ondansetron  (ZOFRAN ) IV, mouth rinse    Assessment/Plan:  1. Acute Hypoxic Respiratory Failure: admitted earlier in the month with AHRF. ?PNA post viral infection. Newly requiring home O2. Presented this admission with respiratory distress despite bipap requiring intubation.  - Hi-Res chest CT 4/25: GG airspace disease in right lung, pulmonary edema, ATX left base, emphysema.  - Respiratory pane negative  - Concern for amio toxicity last admission, recently placed back on amio for ICD shock. Continued high concern for likely APT, though ESR not significantly high (31), CRP 3.9. - Plan to keep off amio for now - Continue to treat w/ steroids + empiric abx for possible PNA (PCT 0.94). On IV solumedrol +ceftriaxone . Dosing per PCCM, appreciate assistance - A/c CHF w/ volume overload also contributing. CVP 20. Poor initial response to 80 IV Lasix . Co-ox ok at  83% - Push diuretics. Give 160 mg IV Lasix  bolus followed by continuous gtt at 20/hr + add metolazone  5 mg x 1 - Vent Management per PCCM     2. Chronic systolic CHF with HM III LVAD implant 04/20/17 - RHC 4/15: RA 6, PA mean 35, PCWP 19, Fick CI 3.1, TD CI 3.0. Speed 5600 - Concern for RV dysfunction given HF presentation - Poor initial response to 80 IV Lasix . CVP 20. Co-ox ok at  83%.  MAPs 80s - Give 160 mg IV Lasix  bolus followed by continuous gtt ato 20/hr + add metolazone  5 mg x 1 - Follow CVPs and Co-oxes - Obtain 2D Echo today  - GDMT limited by renal fx/hyperkalemia   - Can use hydralazine  if MAPs increase    3. VAD management - VAD interrogated personally. Parameters stable.  - INR 1.7 (goal 1.5-2). PharmD dosing warfarin.  - LDH ok, 211  4. AKI on CKD stage 3: baseline SCr 1.5-1.8 - sCr 2.19 on admission>>2.75 today  - suspect renal congestion, CVP 20. MAP and Co-ox adequate  - increase diuretics per above - avoid hypotension - no indication for inotropes at present but may need if difficulties diuresing  - follow BMP    5. Hx of Ventricular tachycardia - Had ICD shock x 2 for VT on 11/24 (device reprogrammed at the time) - S/p fracture lead removal and ICD exchange 1/25 - ICD shock for VT 5/1, amio resumed - Hold amio with concern for lung toxicity - Monitor lytes w/ diuresis.  - continue telemetry monitoring    6. Persistent AF - most recent ICD interrogation with 100% AF burden - in AF 70s on tele - on warfarin; hold amio   7. CAD:  s/p PCI RCA/PLOM with DES x2 and DES to mild LAD 1/18 - denied CP on admit - Continue statin. Off ASA due to GIB/AVMs   8. Urinary retention:  - foley catheter - resume Flomax  prior to removal  9. Hyperkalemia - K 5.8, in setting of AKI on CKD - Lokelma  given. Increasing diuretics   - Obtain f/u afternoon BMP     I reviewed the LVAD parameters from today, and compared the results to the patient's prior recorded data.  No programming changes were made.  The LVAD is functioning within specified parameters.  The patient performs LVAD self-test daily.  LVAD interrogation was negative for any significant power changes, alarms or PI events/speed drops.  LVAD equipment check completed and is in good working order.  Back-up equipment present.   LVAD education done on emergency procedures and precautions and reviewed  exit site care.  CRITICAL CARE Performed by: Ruddy Corral   Total critical care time:  15 minutes  Critical care time was exclusive of separately billable procedures and treating other patients.  Critical care was necessary to treat or prevent imminent or life-threatening deterioration.  Critical care was time spent personally by me on the following activities: development of treatment plan with patient and/or surrogate as well as nursing, discussions with consultants, evaluation of patient's response to treatment, examination of patient, obtaining history from patient or surrogate, ordering and performing treatments and interventions, ordering and review of laboratory studies, ordering and review of radiographic studies, pulse oximetry and re-evaluation of patient's condition.   Length of Stay: 1  Ernestene Headings 09/15/2023, 9:36 AM  VAD Team --- VAD ISSUES ONLY--- Pager [phone] (7am - 7am)  Advanced Heart Failure Team  Pager [phone] (M-F; 7a - 5p)  Please contact [facility] for night-coverage after hours (5p -7a ) and weekends on [email or web address]

## 2023-09-16 DIAGNOSIS — I4819 Other persistent atrial fibrillation: Secondary | ICD-10-CM

## 2023-09-16 DIAGNOSIS — I5043 Acute on chronic combined systolic (congestive) and diastolic (congestive) heart failure: Secondary | ICD-10-CM | POA: Diagnosis not present

## 2023-09-16 DIAGNOSIS — Z95810 Presence of automatic (implantable) cardiac defibrillator: Secondary | ICD-10-CM

## 2023-09-16 DIAGNOSIS — J9601 Acute respiratory failure with hypoxia: Secondary | ICD-10-CM | POA: Diagnosis not present

## 2023-09-16 DIAGNOSIS — I5023 Acute on chronic systolic (congestive) heart failure: Secondary | ICD-10-CM | POA: Diagnosis not present

## 2023-09-16 LAB — POCT I-STAT 7, (LYTES, BLD GAS, ICA,H+H)
Acid-Base Excess: 2 mmol/L (ref 0.0–2.0)
Bicarbonate: 27.9 mmol/L (ref 20.0–28.0)
Calcium, Ion: 1.18 mmol/L (ref 1.15–1.40)
HCT: 38 % — ABNORMAL LOW (ref 39.0–52.0)
Hemoglobin: 12.9 g/dL — ABNORMAL LOW (ref 13.0–17.0)
O2 Saturation: 98 %
Patient temperature: 98.2
Potassium: 4 mmol/L (ref 3.5–5.1)
Sodium: 144 mmol/L (ref 135–145)
TCO2: 29 mmol/L (ref 22–32)
pCO2 arterial: 44.9 mmHg (ref 32–48)
pH, Arterial: 7.4 (ref 7.35–7.45)
pO2, Arterial: 115 mmHg — ABNORMAL HIGH (ref 83–108)

## 2023-09-16 LAB — BASIC METABOLIC PANEL WITH GFR
Anion gap: 12 (ref 5–15)
Anion gap: 13 (ref 5–15)
BUN: 41 mg/dL — ABNORMAL HIGH (ref 6–20)
BUN: 51 mg/dL — ABNORMAL HIGH (ref 6–20)
CO2: 27 mmol/L (ref 22–32)
CO2: 26 mmol/L (ref 22–32)
Calcium: 9.1 mg/dL (ref 8.9–10.3)
Calcium: 8.8 mg/dL — ABNORMAL LOW (ref 8.9–10.3)
Chloride: 103 mmol/L (ref 98–111)
Chloride: 100 mmol/L (ref 98–111)
Creatinine, Ser: 2.63 mg/dL — ABNORMAL HIGH (ref 0.61–1.24)
Creatinine, Ser: 2.55 mg/dL — ABNORMAL HIGH (ref 0.61–1.24)
GFR, Estimated: 27 mL/min — ABNORMAL LOW (ref >60)
GFR, Estimated: 28 mL/min — ABNORMAL LOW (ref >60)
Glucose, Bld: 144 mg/dL — ABNORMAL HIGH (ref 70–99)
Glucose, Bld: 132 mg/dL — ABNORMAL HIGH (ref 70–99)
Potassium: 3.6 mmol/L (ref 3.5–5.1)
Potassium: 3.8 mmol/L (ref 3.5–5.1)
Sodium: 142 mmol/L (ref 135–145)
Sodium: 139 mmol/L (ref 135–145)

## 2023-09-16 LAB — GLUCOSE, CAPILLARY
Glucose-Capillary: 153 mg/dL — ABNORMAL HIGH (ref 70–99)
Glucose-Capillary: 174 mg/dL — ABNORMAL HIGH (ref 70–99)
Glucose-Capillary: 151 mg/dL — ABNORMAL HIGH (ref 70–99)
Glucose-Capillary: 137 mg/dL — ABNORMAL HIGH (ref 70–99)
Glucose-Capillary: 147 mg/dL — ABNORMAL HIGH (ref 70–99)
Glucose-Capillary: 122 mg/dL — ABNORMAL HIGH (ref 70–99)

## 2023-09-16 LAB — MAGNESIUM
Magnesium: 2.3 mg/dL (ref 1.7–2.4)
Magnesium: 2.1 mg/dL (ref 1.7–2.4)

## 2023-09-16 LAB — CBC
HCT: 35.8 % — ABNORMAL LOW (ref 39.0–52.0)
Hemoglobin: 11.2 g/dL — ABNORMAL LOW (ref 13.0–17.0)
MCH: 24.9 pg — ABNORMAL LOW (ref 26.0–34.0)
MCHC: 31.3 g/dL (ref 30.0–36.0)
MCV: 79.6 fL — ABNORMAL LOW (ref 80.0–100.0)
Platelets: 227 10*3/uL (ref 150–400)
RBC: 4.5 MIL/uL (ref 4.22–5.81)
RDW: 16.6 % — ABNORMAL HIGH (ref 11.5–15.5)
WBC: 9 10*3/uL (ref 4.0–10.5)
nRBC: 0 % (ref 0.0–0.2)

## 2023-09-16 LAB — COOXEMETRY PANEL
Carboxyhemoglobin: 1.9 % — ABNORMAL HIGH (ref 0.5–1.5)
Methemoglobin: 0.8 % (ref 0.0–1.5)
O2 Saturation: 72.8 %
Total hemoglobin: 14.9 g/dL (ref 12.0–16.0)

## 2023-09-16 LAB — PHOSPHORUS
Phosphorus: 3.6 mg/dL (ref 2.5–4.6)
Phosphorus: 5 mg/dL — ABNORMAL HIGH (ref 2.5–4.6)

## 2023-09-16 LAB — PROTIME-INR
INR: 2.8 — ABNORMAL HIGH (ref 0.8–1.2)
Prothrombin Time: 29.9 s — ABNORMAL HIGH (ref 11.4–15.2)

## 2023-09-16 LAB — LACTATE DEHYDROGENASE: LDH: 186 U/L (ref 98–192)

## 2023-09-16 MED ORDER — POTASSIUM CHLORIDE CRYS ER 20 MEQ PO TBCR
60.0000 meq | EXTENDED_RELEASE_TABLET | Freq: Once | ORAL | Status: AC
  Administered 2023-09-16: 60 meq via ORAL
  Filled 2023-09-16: qty 3

## 2023-09-16 MED ORDER — METOLAZONE 5 MG PO TABS: 5.0000 mg | ORAL_TABLET | Freq: Once | ORAL | Status: DC

## 2023-09-16 MED ORDER — POTASSIUM CHLORIDE 20 MEQ PO PACK: 40.0000 meq | PACK | Freq: Once | ORAL | Status: DC

## 2023-09-16 MED ORDER — POTASSIUM CHLORIDE 20 MEQ PO PACK
40.0000 meq | PACK | Freq: Once | ORAL | Status: AC
  Administered 2023-09-16: 40 meq
  Filled 2023-09-16: qty 2

## 2023-09-16 MED ORDER — POTASSIUM CHLORIDE 20 MEQ PO PACK: 20.0000 meq | PACK | Freq: Once | ORAL | Status: DC

## 2023-09-16 MED ORDER — ORAL CARE MOUTH RINSE
15.0000 mL | OROMUCOSAL | Status: DC
  Administered 2023-09-16 – 2023-09-17 (×8): 15 mL via OROMUCOSAL

## 2023-09-16 MED ORDER — METOLAZONE 5 MG PO TABS
5.0000 mg | ORAL_TABLET | Freq: Once | ORAL | Status: AC
  Administered 2023-09-16: 5 mg
  Filled 2023-09-16: qty 1

## 2023-09-16 NOTE — Progress Notes (Signed)
 PHARMACY - ANTICOAGULATION CONSULT NOTE  Pharmacy Consult for warfarin Indication: LVAD  Allergies  Allergen Reactions   Plavix [Clopidogrel Bisulfate] Hives    Patient Measurements: Height: 5\' 5"  (165.1 cm) Weight: 49.3 kg (108 lb 11 oz) IBW/kg (Calculated) : 61.5  Vital Signs: Temp: 97.9 F (36.6 C) (05/24 1200) Temp Source: Oral (05/24 1200) BP: 101/87 (05/24 1200) Pulse Rate: 89 (05/24 1430)  Labs: Recent Labs    09/14/23 1434 09/14/23 1530 09/14/23 1647 09/14/23 1852 09/15/23 0450 09/15/23 1212 09/15/23 1750 09/16/23 0435 09/16/23 1138  HGB 12.9*  --   --    < > 11.6*  --   --  11.2* 12.9*  HCT 41.7  --   --    < > 37.9*  --   --  35.8* 38.0*  PLT 285  --   --   --  216  --   --  227  --   LABPROT  --  20.1*  --   --  20.5*  --   --  29.9*  --   INR  --  1.7*  --   --  1.7*  --   --  2.8*  --   CREATININE 2.19*  --   --   --  2.75* 3.23* 2.94* 2.63*  --   TROPONINIHS 23*  --  21*  --   --   --   --   --   --    < > = values in this interval not displayed.    Estimated Creatinine Clearance: 21.1 mL/min (A) (by C-G formula based on SCr of 2.63 mg/dL (H)).   Medical History: Past Medical History:  Diagnosis Date   AICD (automatic cardioverter/defibrillator) present    Anemia    Anxiety    CAD (coronary artery disease) 2009   AMI in 12/2007 with PCI to LAD, staged PCI to  mid/distal RCA, NSTEMI in 02/2009 with BMS to LCx   CHF (congestive heart failure) (HCC)    Chronic kidney disease 11/03/2016   stage 3 kidney disease   Dysrhythmia    "arrythmia problems at some point", "fatal rhythms"   History of blood transfusion    "I was bleeding from was rectum" (04/19/2017)   HLD (hyperlipidemia)    HTN (hypertension)    Hyperlipidemia 03/15/2011   Ischemic cardiomyopathy    Admitted in 07/2010 with CHF exacerbation   Myocardial infarction (HCC)    "I've had 4" (04/19/2017)   Pneumonia 2018 X 1   Seizures (HCC)    one seizure in 04/2016 during cardiac  event (04/19/2017)   STEMI 2009 (anterior), 2010 (lateral), and 2012 (inferior) 03/14/2011     Assessment: 57 yoM with HM3 LVAD on warfarin PTA admitted with hypoxia requiring intubation. Pharmacy to manage warfarin. INR goal 1.5-2, today is 1.7. CBC ok, LDH stable. Home dose is 1mg  daily.  INR with quick jump to 2.8 today ?cause Could be interaction with doxycycline  but seems to fast.  Goal of Therapy:  INR 1.5-2 Monitor platelets by anticoagulation protocol: Yes   Plan:  No Coumadin  tonight. Daily INR  [name], Davey Erp, [facility] Clinical Pharmacist  09/16/2023 2:37 PM   [facility] pharmacy phone numbers are listed on [email or web address]

## 2023-09-16 NOTE — Progress Notes (Signed)
 Patient ID: David [name], male   DOB: Apr 14, 1966, 58 y.o.   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   Several recent admits for respiratory distress, initially with flu A then with HF/post-viral syndrome.   At last admit concern for amio toxicity but ESR ok. Amio stopped but restarted 08/25/23 after ICD shock for VT.   Now readmitted w/ several days progressive/severe SOB. In ER in extremis. CXR with diffuse bilateral infiltrates. Intubated by CCM.  Subjective/Interval Hx:    Intubated awake on vent and following commands, FiO2 40%    ESR 31, CRP 3.9 (checked prior to initiation of steroids)  Respiratory Panel negative PCT 0.92 initially.  Strep Pneumo, Legionella both negative  Covering for PNA with ceftriaxone /doxycyline IV Solumedrol for ?of amiodarone  lung toxicity  Lasix  gtt 40 mg/hr + metolazone  yesterday => I/Os net negative 3007, weight down.  CVP lower at 14 today.   Co-ox 73%  I reviewed echo from yesterday, EF < 20% with interventricular septum slightly right-ward, moderate RV dysfunction/moderate RV enlargement. IVC dilated.    LVAD INTERROGATION:  HeartMate III LVAD:   Flow 5.8 liters/min, speed 5600, power 4.2, PI 3.4.    Objective:    Vital Signs:   Temp:  [97 F (36.1 C)-98.5 F (36.9 C)] 98.5 F (36.9 C) (05/24 0800) Pulse Rate:  [30-101] 101 (05/24 0800) Resp:  [18-26] 26 (05/24 0800) BP: (87-92)/(72-77) 92/77 (05/24 0400) SpO2:  [92 %-100 %] 98 % (05/24 0800) Arterial Line BP: (74-113)/(64-101) 92/78 (05/24 0800) FiO2 (%):  [40 %] 40 % (05/24 0756) Weight:  [49.3 kg] 49.3 kg (05/24 0500) Last BM Date :  (PTA) Mean arterial Pressure 80s  Intake/Output:   Intake/Output Summary (Last 24 hours) at 09/16/2023 0952 Last data filed at  09/16/2023 0800 Gross per 24 hour  Intake 2033.37 ml  Output 5465 ml  Net -3431.63 ml     Physical Exam   CVP 14 General: Well appearing this am. NAD.  HEENT: Normal. Neck: Supple, JVP 14 cm. Carotids OK.  Cardiac:  Mechanical heart sounds with LVAD hum present.  Lungs:  CTAB, normal effort.  Abdomen:  NT, ND, no HSM. No bruits or masses. +BS  LVAD exit site: Well-healed and incorporated. Dressing dry and intact. No erythema or drainage. Stabilization device present and accurately applied. Driveline dressing changed daily per sterile technique. Extremities:  Warm and dry. No cyanosis, clubbing, rash, or edema.  Neuro:  Alert & oriented x 3. Cranial nerves grossly intact. Moves all 4 extremities w/o difficulty. Affect pleasant    Telemetry   Atrial flutter, 70s. Personally reviewed   EKG    N/A   Labs   Basic Metabolic Panel: Recent Labs  Lab 09/14/23 1434 09/14/23 1647 09/14/23 1852 09/15/23 0450 09/15/23 1212 09/15/23 1750 09/16/23 0435  NA 139  --  141 138 138 138 142  K 4.4  --  4.2 5.8* 5.1 4.1 3.6  CL 104  --   --  105 105 103 103  CO2 21*  --   --  22 23 23 27   GLUCOSE 121*  --   --  154* 183* 153* 144*  BUN 15  --   --  22* 28* 34* 41*  CREATININE 2.19*  --   --  2.75* 3.23* 2.94* 2.63*  CALCIUM  9.4  --   --  9.0 8.8* 8.9 9.1  MG  --  1.6*  --   --  1.8 2.7* 2.3  PHOS  --   --   --   --  4.9* 3.6 3.6    Liver Function Tests: Recent Labs  Lab 09/14/23 1434  AST 36  ALT 21  ALKPHOS 120  BILITOT 1.9*  PROT 8.2*  ALBUMIN  2.9*   Recent Labs  Lab 09/14/23 1434  LIPASE 33   No results for input(s): "AMMONIA" in the last 168 hours.  CBC: Recent Labs  Lab 09/14/23 1434 09/14/23 1852 09/15/23 0450 09/16/23 0435  WBC 13.9*  --  6.8 9.0  NEUTROABS 11.6*  --   --   --   HGB 12.9* 13.3 11.6* 11.2*  HCT 41.7 39.0 37.9* 35.8*  MCV 81.9  --  81.0 79.6*  PLT 285  --  216 227    INR: Recent Labs  Lab 09/11/23 0000 09/14/23 1530  09/15/23 0450 09/16/23 0435  INR 2.2 1.7* 1.7* 2.8*    Other results: EKG:    Imaging   ECHOCARDIOGRAM COMPLETE Result Date: 09/15/2023    ECHOCARDIOGRAM REPORT   Patient Name:   David [name] Date of Exam: 09/15/2023 Medical Rec #:  [id number]        Height:       65.0 in Accession #:    [id number]       Weight:       121.9 lb Date of Birth:  [birth year]        BSA:          1.603 m Patient Age:    57 years         BP:           103/86 mmHg Patient Gender: M                HR:           88 bpm. Exam Location:  Inpatient Procedure: 2D Echo, Cardiac Doppler and Color Doppler (Both Spectral and Color            Flow Doppler were utilized during procedure). Indications:    Congestive heart failure  History:        Patient has prior history of Echocardiogram examinations. LVAD,                 Cardiomyopathy and CHF, CAD; Risk Factors:Former Smoker and                 Hypertension.  Sonographer:    Willey Harrier Referring Phys: [id number] [place] LEE IMPRESSIONS  1. LVAD LV apex.  2. Left ventricular ejection fraction, by estimation, is <20%. The left ventricle has severely decreased function. The left ventricle demonstrates global hypokinesis. The left ventricular internal cavity size was mildly dilated. Left ventricular diastolic parameters are indeterminate.  3. Right ventricular systolic function is normal. The right ventricular size is normal. There is severely elevated pulmonary artery systolic pressure.  4. Left atrial size was severely dilated.  5. The mitral valve is normal in structure. Mild mitral valve regurgitation. No evidence of mitral stenosis.  6. The aortic valve has an indeterminant number of cusps. Aortic valve regurgitation is trivial. No aortic stenosis is present.  7. The inferior vena cava is dilated in size with <50% respiratory variability, suggesting right atrial pressure of 15 mmHg. FINDINGS  Left Ventricle: Left ventricular ejection fraction, by  estimation, is <20%. The left  ventricle has severely decreased function. The left ventricle demonstrates global hypokinesis. The left ventricular internal cavity size was mildly dilated. There is no  left ventricular hypertrophy. Left ventricular diastolic parameters are indeterminate. Right Ventricle: The right ventricular size is normal. Right ventricular systolic function is normal. There is severely elevated pulmonary artery systolic pressure. The tricuspid regurgitant velocity is 3.50 m/s, and with an assumed right atrial pressure  of 15 mmHg, the estimated right ventricular systolic pressure is 64.0 mmHg. Left Atrium: Left atrial size was severely dilated. Right Atrium: Right atrial size was normal in size. Pericardium: There is no evidence of pericardial effusion. Mitral Valve: The mitral valve is normal in structure. Mild mitral valve regurgitation. No evidence of mitral valve stenosis. Tricuspid Valve: The tricuspid valve is normal in structure. Tricuspid valve regurgitation is mild . No evidence of tricuspid stenosis. Aortic Valve: The aortic valve has an indeterminant number of cusps. Aortic valve regurgitation is trivial. No aortic stenosis is present. Pulmonic Valve: The pulmonic valve was normal in structure. Pulmonic valve regurgitation is mild. No evidence of pulmonic stenosis. Aorta: The aortic root is normal in size and structure. Venous: The inferior vena cava is dilated in size with less than 50% respiratory variability, suggesting right atrial pressure of 15 mmHg. IAS/Shunts: No atrial level shunt detected by color flow Doppler. Additional Comments: LVAD LV apex. A device lead is visualized.  LEFT VENTRICLE PLAX 2D LVIDd:         5.90 cm LVIDs:         5.90 cm LV PW:         0.70 cm LV IVS:        0.90 cm LVOT diam:     1.90 cm LVOT Area:     2.84 cm  IVC IVC diam: 2.30 cm LEFT ATRIUM           Index        RIGHT ATRIUM           Index LA Vol (A4C): 75.7 ml 47.23 ml/m  RA Area:     16.60 cm                                     RA Volume:   51.40 ml  32.07 ml/m   AORTA Ao Asc diam: 3.20 cm TRICUSPID VALVE TR Peak grad:   49.0 mmHg TR Vmax:        350.00 cm/s  SHUNTS Systemic Diam: 1.90 cm Alexandria Angel MD Electronically signed by Alexandria Angel MD Signature Date/Time: 09/15/2023/12:29:27 PM    Final    DG Chest Port 1 View Result Date: 09/15/2023 CLINICAL DATA:  Heart failure EXAM: PORTABLE CHEST 1 VIEW COMPARISON:  Yesterday FINDINGS: Endotracheal tube with tip just above the carina by 6 mm. Enteric tube with tip at least in the stomach. Right IJ line with tip at the upper cavoatrial junction. Interstitial and airspace opacity on both sides with small left pleural effusion. No pneumothorax. IMPRESSION: Unchanged hardware positioning and pulmonary edema. Electronically Signed   By: Ronnette Coke M.D.   On: 09/15/2023 10:40   DG Chest Port 1 View Result Date: 09/14/2023 CLINICAL DATA:  Check central line placement EXAM: PORTABLE CHEST 1 VIEW COMPARISON:  Film from earlier in the same day. FINDINGS: Cardiac shadow is enlarged. LVAD is again seen in place. Defibrillator is noted. Endotracheal tube is noted  1 cm above the carina. The gastric catheter extends into the stomach. Right jugular central line is seen extending to the cavoatrial junction. No pneumothorax is noted. Lungs are well aerated diffuse airspace opacity stable from the prior exam likely representing edema. No bony abnormality is seen. IMPRESSION: Tubes and lines as described above. No evidence of pneumothorax following central line placement. Electronically Signed   By: Violeta Grey M.D.   On: 09/14/2023 20:13   DG Chest Port 1 View Result Date: 09/14/2023 CLINICAL DATA:  Respiratory failure EXAM: PORTABLE CHEST 1 VIEW COMPARISON:  Radiograph earlier today FINDINGS: Endotracheal tube tip 17 mm from the carina. Tip and side port of the enteric tube below the diaphragm in the stomach. Left-sided pacemaker in place. Left ventricular assist device in place. Previous  median sternotomy. Cardiomegaly is grossly stable. Diffuse interstitial and heterogeneous bilateral lung opacities without significant interval change from earlier today. There may be small pleural effusions. No pneumothorax. IMPRESSION: 1. Endotracheal tube tip 17 mm from the carina. Enteric tube in the stomach. 2. Diffuse interstitial and heterogeneous bilateral lung opacities without significant interval change from earlier today, edema versus infection. Stable cardiomegaly. Electronically Signed   By: Chadwick Colonel M.D.   On: 09/14/2023 18:45   DG Chest Portable 1 View Result Date: 09/14/2023 CLINICAL DATA:  Shortness of breath, cough. EXAM: PORTABLE CHEST 1 VIEW COMPARISON:  August 03, 2023. FINDINGS: Stable cardiomegaly. Left-sided defibrillator is unchanged. Left ventricular assist device is unchanged. Increased diffuse reticulonodular densities are noted concerning for edema or possibly pneumonia. Bony thorax is unremarkable. IMPRESSION: Stable support apparatus as noted above. Increased bilateral diffuse lung opacities are noted concerning for edema or possibly pneumonia. Electronically Signed   By: Rosalene Colon M.D.   On: 09/14/2023 16:05     Medications:     Scheduled Medications:  atorvastatin   40 mg Per Tube Daily   Chlorhexidine  Gluconate Cloth  6 each Topical Daily   feeding supplement (PROSource TF20)  60 mL Per Tube Daily   insulin  aspart  2-6 Units Subcutaneous Q4H   methylPREDNISolone (SOLU-MEDROL) injection  60 mg Intravenous Q12H   metolazone   5 mg Oral Once   mouth rinse  15 mL Mouth Rinse Q2H   pantoprazole  (PROTONIX ) IV  40 mg Intravenous Daily   revefenacin  175 mcg Nebulization Daily   sodium chloride  HYPERTONIC  4 mL Nebulization BID   thiamine  100 mg Per Tube Daily   Warfarin - Pharmacist Dosing Inpatient   Does not apply q1600    Infusions:  cefTRIAXone  (ROCEPHIN )  IV Stopped (09/15/23 1719)   doxycycline  (VIBRAMYCIN ) IV 125 mL/hr at 09/16/23 0630    feeding supplement (VITAL 1.5 CAL) 40 mL/hr at 09/16/23 0630   fentaNYL  infusion INTRAVENOUS 75 mcg/hr (09/16/23 0630)   furosemide  (LASIX ) 200 mg in dextrose  5 % 100 mL (2 mg/mL) infusion 40 mg/hr (09/16/23 0853)    PRN Medications: acetaminophen , fentaNYL , fentaNYL , ipratropium-albuterol , ondansetron  (ZOFRAN ) IV, mouth rinse    Assessment/Plan:    1. Acute Hypoxic Respiratory Failure: admitted earlier in the month with AHRF. ?PNA post viral infection. Newly requiring home O2. Presented this admission with respiratory distress despite bipap requiring intubation.  - Hi-Res chest CT 4/25: GG airspace disease in right lung, pulmonary edema, ATX left base, emphysema. Concern for amiodarone  lung toxicity but would also consider aspiration PNA.  - Respiratory panel negative  - Concern for amio toxicity last admission, recently placed back on amiodarone  for ICD shock. Continued concern for amiodarone   lung toxicity, though ESR not significantly high (31), CRP 3.9. - Continue off amiodarone . Has been treated with Solumedrol, will continue for now.  - On empiric abx for possible PNA, ceftriaxone /doxy (PCT 0.94).  - I wonder if volume overload, however, is the main driver currently.  CVP yesterday was 20.  He was aggressively diuresed, now on Lasix  gtt 40 mg/hr and CVP down to 14.  Continue Lasix  gtt 40 mg/hr and will give another dose of metolazone  5 mg x 1 today.  Creatinine trending down 2.94 => 2.63. Discussed K replacement with pharmacy.  - Vent Management per PCCM     2. Chronic systolic CHF with HM III LVAD implant 04/20/17 - RHC 4/15: RA 6, PA mean 35, PCWP 19, Fick CI 3.1, TD CI 3.0. Speed 5600 - Concern for RV dysfunction given HF presentation - I reviewed echo, EF < 20% with interventricular septum slightly right-ward, moderate RV dysfunction/moderate RV enlargement. IVC dilated.  With rightward septum, I increased speed to 5700 rpm.  - Co-ox 73%, no indication for inotrope.  - GDMT  limited by renal fx/hyperkalemia   - Can use hydralazine  if MAPs increase    3. VAD management - VAD interrogated personally. Parameters stable.  - INR 2.8 (goal 1.5-2). PharmD dosing warfarin.  - LDH 186  4. AKI on CKD stage 3: baseline SCr 1.5-1.8 - sCr 2.19 on admission>>2.75>>2.63 today  - suspect renal venous congestion, creatinine trending down with diuresis. - avoid hypotension - follow BMP    5. Hx of Ventricular tachycardia - Had ICD shock x 2 for VT on 11/24 (device reprogrammed at the time) - S/p fracture lead removal and ICD exchange 1/25 - ICD shock for VT 5/1, amio resumed - Hold amio with concern for lung toxicity - Monitor lytes w/ diuresis.  - continue telemetry monitoring  - If further VT, could use mexiletine.    6. Persistent AFL - most recent ICD interrogation with 100% AF burden - in AFL 70s on tele - on warfarin; now off amiodarone    7. CAD:  s/p PCI RCA/PLOM with DES x2 and DES to mild LAD 1/18 - denied CP on admit - Continue statin. Off ASA due to GIB/AVMs   8. Urinary retention:  - foley catheter - resume Flomax  prior to removal  9. Hyperkalemia - Resolved.      I reviewed the LVAD parameters from today, and compared the results to the patient's prior recorded data.  No programming changes were made.  The LVAD is functioning within specified parameters.  The patient performs LVAD self-test daily.  LVAD interrogation was negative for any significant power changes, alarms or PI events/speed drops.  LVAD equipment check completed and is in good working order.  Back-up equipment present.   LVAD education done on emergency procedures and precautions and reviewed exit site care.  CRITICAL CARE Performed by: Peder Bourdon   Total critical care time: 45 minutes  Critical care time was exclusive of separately billable procedures and treating other patients.  Critical care was necessary to treat or prevent imminent or life-threatening  deterioration.  Critical care was time spent personally by me on the following activities: development of treatment plan with patient and/or surrogate as well as nursing, discussions with consultants, evaluation of patient's response to treatment, examination of patient, obtaining history from patient or surrogate, ordering and performing treatments and interventions, ordering and review of laboratory studies, ordering and review of radiographic studies, pulse oximetry and re-evaluation of patient's condition.  Length of Stay: 2  Peder Bourdon, MD 09/16/2023, 9:52 AM  VAD Team --- VAD ISSUES ONLY--- Pager [phone] (7am - 7am)  Advanced Heart Failure Team  Pager [phone] (M-F; 7a - 5p)  Please contact [facility] for night-coverage after hours (5p -7a ) and weekends on [email or web address]

## 2023-09-16 NOTE — Plan of Care (Signed)
  Problem: Education: Goal: Patient will understand all VAD equipment and how it functions Outcome: Progressing   Problem: Cardiac: Goal: LVAD will function as expected and patient will experience no clinical alarms Outcome: Progressing   Problem: Activity: Goal: Ability to tolerate increased activity will improve Outcome: Progressing   Problem: Respiratory: Goal: Ability to maintain a clear airway and adequate ventilation will improve Outcome: Progressing   Problem: Role Relationship: Goal: Method of communication will improve Outcome: Progressing   Problem: Education: Goal: Knowledge of General Education information will improve Description: Including pain rating scale, medication(s)/side effects and non-pharmacologic comfort measures Outcome: Progressing

## 2023-09-16 NOTE — Procedures (Signed)
 Extubation Procedure Note  Patient Details:   Name: David [name] DOB: Nov 09, 1965 MRN: [id number]   Airway Documentation:    Vent end date: 09/16/23 Vent end time: 1145   Evaluation  O2 sats: stable throughout Complications: No apparent complications Patient did tolerate procedure well. Bilateral Breath Sounds: Clear, Diminished   Yes  [name] 09/16/2023, 11:46 AM

## 2023-09-16 NOTE — Progress Notes (Signed)
 K 3.8 on furosemide  drip.  KCL 60meq x1  Ivery Marking, PharmD, [place], AAHIVP, CPP Infectious Disease Pharmacist 09/16/2023 5:32 PM

## 2023-09-16 NOTE — Progress Notes (Signed)
 NAME:  David [name], MRN:  [id number], DOB:  [birth year], LOS: 2 ADMISSION DATE:  09/14/2023, CONSULTATION DATE:  5/22 REFERRING MD:  Bensimhon, CHIEF COMPLAINT:  resp failure   History of Present Illness:  This is a 58 year old male w/ advanced systolic HF, CAD, HTn, CKD stage IIIA. He has been on HM III LVAD (which is his destination d/t CKD) since 2019. Last hospital stay April 2025 for acute HF, prob sepsis, also complicated by persistent hypoxia felt possibly mixed picture of PNA  (felt influenza A PNA w/ post viral PNA) vs amidarone toxicity. During this stay diuresed 15lbs fluid off. Speed of VAD increased and sent home on oxygen.  Followed at [facility] clinic for coumadin  dosing and LVAD clinic post discharge in  April at HF clinic: O2 sats 4 lpm 95% and 92-93% on room air.  5/2 VAD clinic eval as felt had shock by ICD. Event eval showed AFL sustained VT, 1 burst of ATP followed by 40J of HV therapy converting back to AFL/VS." See EP RN note for further details. Amiodarone  resumed.   5/20 nursing calling HF clinic reporting increased exertional shortness of breath, advised to inc Oxygen from 2 to 4 lpm. Clinic f/u scheduled for 5/23 Presents to ER 5/24  5/22 presented to ER w/ worsening SOB, NP dry cough, wt up 2 lbs. + orthopnea. Pulse ox 86% on 2 lpm RR 40s. Marked distress SOB worse in ER. W/ progressive hypoxia. PCXR w/ diffuse pulmonary edema.  Placed on NIPPV, w/ no improvement so PCCM called by HF team for intubation and Critical care support.  Pertinent  Medical History  HFrEF s/p HMIII LVAD implant 2019, cardiogenic shock,recent hospital stay for acute HF and presumed infection, during this stay VAD speed increased to 5600, (last op visit VAD speed 5600, flow 5.3) chronic AC w/ warfarin, gastric AVMs, afib, prior DC/CV, ICD,SAH, HTN, urinary retention, concern for amiodarone  toxicity also during this stay, CKD stage 3a. Prior GIB off ASA  Significant Hospital Events: Including  procedures, antibiotic start and stop dates in addition to other pertinent events   5/22 admitted w/ acute on chronic HF   Interim History / Subjective:  Wide awake on vent. Diuresed 4 liters  Objective    Blood pressure 92/77, pulse (!) 101, temperature 98.5 F (36.9 C), temperature source Axillary, resp. rate (!) 26, height 5\' 5"  (1.651 m), weight 49.3 kg, SpO2 98%. CVP:  [11 mmHg-33 mmHg] 15 mmHg  Vent Mode: PRVC FiO2 (%):  [40 %] 40 % Set Rate:  [20 bmp] 20 bmp Vt Set:  [490 mL] 490 mL PEEP:  [5 cmH20] 5 cmH20 Plateau Pressure:  [20 cmH20-21 cmH20] 21 cmH20   Intake/Output Summary (Last 24 hours) at 09/16/2023 1100 Last data filed at 09/16/2023 1044 Gross per 24 hour  Intake 1697.83 ml  Output 5795 ml  Net -4097.17 ml   Filed Weights   09/15/23 0458 09/16/23 0500  Weight: 55.3 kg 49.3 kg    Examination: No distress LVAD hum Moves to command Secretion burden improved per RN Ext warm  Coox 72 Cr improving  Resolved problem list   Assessment and Plan    AoC systolic HF HM III LVAD in situ  Persistent Afib  Hx VT ICD in situ Hx CAD Hx HTN  Possible sepsis 2/2 PNA  Acute resp failure w hypoxia  Pulm edema vs PNA vs amio lung tox  AKI on CKD 3a: c/w cardiorenal Hx urinary retention  Hx SAH  ESR, imaging not really c/w amio lung toxicity Agree with aggressive diuresis Will work to try to extubate today 5 days or so of abx is reasonable; will cehck AM pct to make sure going in right direction; given rapid improvement likely just edema I do think he needs an MBSS and esophagram to make sure this isn't chronic occult aspiration Remainder of care per primary, discussed with AHF, will follow until stable off vent Will discuss long-term steroids with AHF at some point, not really sure he needs them  33 min cc time Ardelle Kos MD PCCM

## 2023-09-17 DIAGNOSIS — I5023 Acute on chronic systolic (congestive) heart failure: Secondary | ICD-10-CM | POA: Diagnosis not present

## 2023-09-17 DIAGNOSIS — I5043 Acute on chronic combined systolic (congestive) and diastolic (congestive) heart failure: Secondary | ICD-10-CM | POA: Diagnosis not present

## 2023-09-17 DIAGNOSIS — J9601 Acute respiratory failure with hypoxia: Secondary | ICD-10-CM | POA: Diagnosis not present

## 2023-09-17 DIAGNOSIS — Z95810 Presence of automatic (implantable) cardiac defibrillator: Secondary | ICD-10-CM | POA: Diagnosis not present

## 2023-09-17 DIAGNOSIS — I4819 Other persistent atrial fibrillation: Secondary | ICD-10-CM | POA: Diagnosis not present

## 2023-09-17 LAB — CBC
HCT: 36.2 % — ABNORMAL LOW (ref 39.0–52.0)
Hemoglobin: 11.6 g/dL — ABNORMAL LOW (ref 13.0–17.0)
MCH: 25.1 pg — ABNORMAL LOW (ref 26.0–34.0)
MCHC: 32 g/dL (ref 30.0–36.0)
MCV: 78.2 fL — ABNORMAL LOW (ref 80.0–100.0)
Platelets: 255 10*3/uL (ref 150–400)
RBC: 4.63 MIL/uL (ref 4.22–5.81)
RDW: 16.2 % — ABNORMAL HIGH (ref 11.5–15.5)
WBC: 13.8 10*3/uL — ABNORMAL HIGH (ref 4.0–10.5)
nRBC: 0 % (ref 0.0–0.2)

## 2023-09-17 LAB — COOXEMETRY PANEL
Carboxyhemoglobin: 1.5 % (ref 0.5–1.5)
Methemoglobin: <0.7 % (ref 0.0–1.5)
O2 Saturation: 80.6 %
Total hemoglobin: 12.1 g/dL (ref 12.0–16.0)

## 2023-09-17 LAB — BASIC METABOLIC PANEL WITH GFR
Anion gap: 15 (ref 5–15)
Anion gap: 15 (ref 5–15)
BUN: 57 mg/dL — ABNORMAL HIGH (ref 6–20)
BUN: 61 mg/dL — ABNORMAL HIGH (ref 6–20)
CO2: 30 mmol/L (ref 22–32)
CO2: 32 mmol/L (ref 22–32)
Calcium: 9.1 mg/dL (ref 8.9–10.3)
Calcium: 8.9 mg/dL (ref 8.9–10.3)
Chloride: 91 mmol/L — ABNORMAL LOW (ref 98–111)
Chloride: 86 mmol/L — ABNORMAL LOW (ref 98–111)
Creatinine, Ser: 2.41 mg/dL — ABNORMAL HIGH (ref 0.61–1.24)
Creatinine, Ser: 2.5 mg/dL — ABNORMAL HIGH (ref 0.61–1.24)
GFR, Estimated: 30 mL/min — ABNORMAL LOW (ref >60)
GFR, Estimated: 29 mL/min — ABNORMAL LOW (ref >60)
Glucose, Bld: 136 mg/dL — ABNORMAL HIGH (ref 70–99)
Glucose, Bld: 125 mg/dL — ABNORMAL HIGH (ref 70–99)
Potassium: 3.7 mmol/L (ref 3.5–5.1)
Potassium: 3.9 mmol/L (ref 3.5–5.1)
Sodium: 136 mmol/L (ref 135–145)
Sodium: 133 mmol/L — ABNORMAL LOW (ref 135–145)

## 2023-09-17 LAB — GLUCOSE, CAPILLARY
Glucose-Capillary: 100 mg/dL — ABNORMAL HIGH (ref 70–99)
Glucose-Capillary: 118 mg/dL — ABNORMAL HIGH (ref 70–99)
Glucose-Capillary: 125 mg/dL — ABNORMAL HIGH (ref 70–99)
Glucose-Capillary: 126 mg/dL — ABNORMAL HIGH (ref 70–99)
Glucose-Capillary: 134 mg/dL — ABNORMAL HIGH (ref 70–99)
Glucose-Capillary: 149 mg/dL — ABNORMAL HIGH (ref 70–99)

## 2023-09-17 LAB — LACTATE DEHYDROGENASE: LDH: 243 U/L — ABNORMAL HIGH (ref 98–192)

## 2023-09-17 LAB — PROTIME-INR
INR: 2.2 — ABNORMAL HIGH (ref 0.8–1.2)
Prothrombin Time: 24.5 s — ABNORMAL HIGH (ref 11.4–15.2)

## 2023-09-17 LAB — MAGNESIUM: Magnesium: 1.8 mg/dL (ref 1.7–2.4)

## 2023-09-17 MED ORDER — TRAZODONE HCL 50 MG PO TABS
50.0000 mg | ORAL_TABLET | Freq: Every day | ORAL | Status: DC
  Administered 2023-09-17 – 2023-09-22 (×6): 50 mg via ORAL
  Filled 2023-09-17 (×6): qty 1

## 2023-09-17 MED ORDER — ATORVASTATIN CALCIUM 40 MG PO TABS
40.0000 mg | ORAL_TABLET | Freq: Every day | ORAL | Status: DC
  Administered 2023-09-17 – 2023-09-23 (×7): 40 mg via ORAL
  Filled 2023-09-17 (×7): qty 1

## 2023-09-17 MED ORDER — POTASSIUM CHLORIDE CRYS ER 20 MEQ PO TBCR
40.0000 meq | EXTENDED_RELEASE_TABLET | ORAL | Status: AC
Start: 2023-09-17 — End: 2023-09-17
  Administered 2023-09-17 (×3): 40 meq via ORAL
  Filled 2023-09-17 (×3): qty 2

## 2023-09-17 MED ORDER — METOLAZONE 5 MG PO TABS
5.0000 mg | ORAL_TABLET | Freq: Once | ORAL | Status: AC
  Administered 2023-09-17: 5 mg via ORAL
  Filled 2023-09-17: qty 1

## 2023-09-17 MED ORDER — POTASSIUM CHLORIDE CRYS ER 20 MEQ PO TBCR
40.0000 meq | EXTENDED_RELEASE_TABLET | ORAL | Status: AC
  Administered 2023-09-17 – 2023-09-18 (×2): 40 meq via ORAL
  Filled 2023-09-17 (×2): qty 2

## 2023-09-17 MED ORDER — THIAMINE MONONITRATE 100 MG PO TABS
100.0000 mg | ORAL_TABLET | Freq: Every day | ORAL | Status: AC
  Administered 2023-09-17 – 2023-09-21 (×5): 100 mg via ORAL
  Filled 2023-09-17 (×5): qty 1

## 2023-09-17 MED ORDER — WARFARIN 0.5 MG HALF TABLET
0.5000 mg | ORAL_TABLET | Freq: Once | ORAL | Status: DC
  Filled 2023-09-17: qty 1

## 2023-09-17 MED ORDER — INSULIN ASPART 100 UNIT/ML IJ SOLN
2.0000 [IU] | INTRAMUSCULAR | Status: DC
  Administered 2023-09-17 – 2023-09-18 (×2): 2 [IU] via SUBCUTANEOUS
  Administered 2023-09-18 (×2): 4 [IU] via SUBCUTANEOUS
  Administered 2023-09-19: 2 [IU] via SUBCUTANEOUS
  Administered 2023-09-19: 6 [IU] via SUBCUTANEOUS
  Administered 2023-09-20: 2 [IU] via SUBCUTANEOUS
  Administered 2023-09-20: 6 [IU] via SUBCUTANEOUS
  Administered 2023-09-21: 2 [IU] via SUBCUTANEOUS

## 2023-09-17 MED ORDER — MAGNESIUM SULFATE 2 GM/50ML IV SOLN
2.0000 g | Freq: Once | INTRAVENOUS | Status: AC
  Administered 2023-09-17: 2 g via INTRAVENOUS
  Filled 2023-09-17: qty 50

## 2023-09-17 NOTE — Progress Notes (Signed)
 Patient ID: David [name], male   DOB: [birth year], 58 y.o.   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   Several recent admits for respiratory distress, initially with flu A then with HF/post-viral syndrome.   At last admit concern for amio toxicity but ESR ok. Amio stopped but restarted 08/25/23 after ICD shock for VT.   Now readmitted w/ several days progressive/severe SOB. In ER in extremis. CXR with diffuse bilateral infiltrates. Intubated by CCM.  Subjective/Interval Hx:    Extubated on 5/24, now on 4L [place].   ESR 31, CRP 3.9 (checked prior to initiation of steroids)  Respiratory Panel negative PCT 0.92 initially.  Strep Pneumo, Legionella both negative  Covering for PNA with ceftriaxone /doxycyline IV Solumedrol for ?of amiodarone  lung toxicity  Lasix  gtt 40 mg/hr + metolazone  yesterday => I/Os net negative 2895, weight down.  CVP 15 today.   Co-ox 81%  Echo this admission with EF < 20% with interventricular septum slightly right-ward, moderate RV dysfunction/moderate RV enlargement. IVC dilated.   LVAD INTERROGATION:  HeartMate III LVAD:   Flow 5.4 liters/min, speed 5700, power 4.5, PI 3.2.   LDH 243  Objective:    Vital Signs:   Temp:  [97.7 F (36.5 C)-98 F (36.7 C)] 97.7 F (36.5 C) (05/25 0800) Pulse Rate:  [73-133] 100 (05/25 0630) Resp:  [13-31] 14 (05/25 0630) BP: (81-101)/(73-87) 93/77 (05/25 0400) SpO2:  [81 %-100 %] 93 % (05/25 0630) Arterial Line BP: (81-122)/(73-101) 104/89 (05/25 0630) FiO2 (%):  [40 %] 40 % (05/24 1108) Weight:  [49 kg] 49 kg (05/25 0500) Last BM Date :  (PTA) Mean arterial Pressure 80s  Intake/Output:   Intake/Output Summary (Last 24 hours) at 09/17/2023 0920 Last data filed at 09/17/2023 0645 Gross per 24 hour   Intake 3202.72 ml  Output 6065 ml  Net -2862.28 ml     Physical Exam   CVP 15 General: Well appearing this am. NAD.  HEENT: Normal. Neck: Supple, JVP 15-16 cm. Carotids OK.  Cardiac:  Mechanical heart sounds with LVAD hum present.  Lungs:  CTAB, normal effort.  Abdomen:  NT, ND, no HSM. No bruits or masses. +BS  LVAD exit site: Well-healed and incorporated. Dressing dry and intact. No erythema or drainage. Stabilization device present and accurately applied. Driveline dressing changed daily per sterile technique. Extremities:  Warm and dry. No cyanosis, clubbing, rash, or edema.  Neuro:  Alert & oriented x 3. Cranial nerves grossly intact. Moves all 4 extremities w/o difficulty. Affect pleasant     Telemetry   Atrial flutter, 70s. Personally reviewed   EKG    N/A   Labs   Basic Metabolic Panel: Recent Labs  Lab 09/14/23 1647 09/14/23 1852 09/15/23 1212 09/15/23 1750 09/16/23 0435 09/16/23 1138 09/16/23 1637 09/17/23 0456  NA  --    < > 138 138 142 144 139 136  K  --    < > 5.1 4.1 3.6 4.0 3.8 3.7  CL  --    < > 105 103 103  --  100 91*  CO2  --    < > 23 23 27   --  26 30  GLUCOSE  --    < > 183* 153* 144*  --  132* 136*  BUN  --    < > 28* 34* 41*  --  51* 57*  CREATININE  --    < > 3.23* 2.94* 2.63*  --  2.55* 2.41*  CALCIUM   --    < > 8.8* 8.9 9.1  --  8.8* 9.1  MG 1.6*  --  1.8 2.7* 2.3  --  2.1  --   PHOS  --   --  4.9* 3.6 3.6  --  5.0*  --    < > = values in this interval not displayed.    Liver Function Tests: Recent Labs  Lab 09/14/23 1434  AST 36  ALT 21  ALKPHOS 120  BILITOT 1.9*  PROT 8.2*  ALBUMIN  2.9*   Recent Labs  Lab 09/14/23 1434  LIPASE 33   No results for input(s): "AMMONIA" in the last 168 hours.  CBC: Recent Labs  Lab 09/14/23 1434 09/14/23 1852 09/15/23 0450 09/16/23 0435 09/16/23 1138 09/17/23 0456  WBC 13.9*  --  6.8 9.0  --  13.8*  NEUTROABS 11.6*  --   --   --   --   --   HGB 12.9* 13.3 11.6* 11.2* 12.9*  11.6*  HCT 41.7 39.0 37.9* 35.8* 38.0* 36.2*  MCV 81.9  --  81.0 79.6*  --  78.2*  PLT 285  --  216 227  --  255    INR: Recent Labs  Lab 09/11/23 0000 09/14/23 1530 09/15/23 0450 09/16/23 0435 09/17/23 0456  INR 2.2 1.7* 1.7* 2.8* 2.2*    Other results: EKG:    Imaging   No results found.    Medications:     Scheduled Medications:  atorvastatin   40 mg Per Tube Daily   Chlorhexidine  Gluconate Cloth  6 each Topical Daily   feeding supplement (PROSource TF20)  60 mL Per Tube Daily   insulin  aspart  2-6 Units Subcutaneous Q4H   metolazone   5 mg Oral Once   mouth rinse  15 mL Mouth Rinse Q4H   pantoprazole  (PROTONIX ) IV  40 mg Intravenous Daily   potassium chloride   40 mEq Oral Q4H   revefenacin  175 mcg Nebulization Daily   sodium chloride  HYPERTONIC  4 mL Nebulization BID   thiamine  100 mg Per Tube Daily   traZODone   50 mg Oral QHS   Warfarin - Pharmacist Dosing Inpatient   Does not apply q1600    Infusions:  cefTRIAXone  (ROCEPHIN )  IV Stopped (09/16/23 1659)   doxycycline  (VIBRAMYCIN ) IV 125 mL/hr at 09/17/23 0645   furosemide  (LASIX ) 200 mg in dextrose  5 % 100 mL (2 mg/mL) infusion 40 mg/hr (09/17/23 0645)    PRN Medications: acetaminophen , ipratropium-albuterol , ondansetron  (ZOFRAN ) IV, mouth rinse    Assessment/Plan:    1. Acute Hypoxic Respiratory Failure: admitted earlier in the month with AHRF. ?PNA post viral infection. Newly requiring home O2. Presented this admission with respiratory distress despite bipap requiring intubation. Now extubated.  - Hi-Res chest CT 4/25: GG airspace disease in right lung, pulmonary edema, ATX left base, emphysema. Concern for amiodarone  lung toxicity but would also consider aspiration PNA.  - Respiratory panel negative  - Concern for amio toxicity last admission, recently placed back on amiodarone  for ICD shock. Continued concern for amiodarone  lung toxicity this admission, though ESR not significantly high (31), CRP  3.9. - Continue off amiodarone . Reviewed CT with Dr. Felipe Horton, think problem is more likely volume overload than amiodarone  lung toxicity.  Solumedrol stopped.  - On empiric abx for possible  PNA, ceftriaxone /doxy (PCT 0.94).  - I suspect volume overload is the main driver currently. He has been aggressively diuresed with I/Os negative and weight down, now on Lasix  gtt 40 mg/hr and CVP 15 today.  Continue Lasix  gtt 40 mg/hr and will give another dose of metolazone  5 mg x 1 today.  Creatinine trending down 2.94 => 2.63 => 2.41. Discussed K replacement with pharmacy.   2. Chronic systolic CHF with HM III LVAD implant 04/20/17 - RHC 4/15: RA 6, PA mean 35, PCWP 19, Fick CI 3.1, TD CI 3.0. Speed 5600 - Concern for RV dysfunction given HF presentation - I reviewed echo, EF < 20% with interventricular septum slightly right-ward, moderate RV dysfunction/moderate RV enlargement. IVC dilated.  With rightward septum, I increased speed to 5700 rpm.  - Co-ox 81%, no indication for inotrope.  - GDMT limited by renal fx/hyperkalemia   - MAP 80s-90s.    3. VAD management - VAD interrogated personally. Parameters stable.  - INR 2.2 (goal 1.5-2). PharmD dosing warfarin.  - LDH 186 => 243  4. AKI on CKD stage 3: baseline SCr 1.5-1.8 - sCr 2.19 on admission>>2.75>>2.63>>2.41 today  - suspect renal venous congestion, creatinine trending down with diuresis. - avoid hypotension - follow BMET    5. Hx of Ventricular tachycardia - Had ICD shock x 2 for VT on 11/24 (device reprogrammed at the time) - S/p fracture lead removal and ICD exchange 1/25 - ICD shock for VT 5/1, amio resumed - Stopped amiodarone  again with concern for lung toxicity - Monitor lytes w/ diuresis.  - continue telemetry monitoring  - If further VT, could use mexiletine.    6. Persistent AFL - most recent ICD interrogation with 100% AF burden - in AFL 70s on tele - on warfarin; now off amiodarone    7. CAD:  s/p PCI RCA/PLOM with DES x2  and DES to mild LAD 1/18 - denied CP on admit - Continue statin. Off ASA due to GIB/AVMs   8. Urinary retention:  - foley catheter - resume Flomax  prior to removal  9. Hyperkalemia - Resolved.    I reviewed the LVAD parameters from today, and compared the results to the patient's prior recorded data.  No programming changes were made.  The LVAD is functioning within specified parameters.  The patient performs LVAD self-test daily.  LVAD interrogation was negative for any significant power changes, alarms or PI events/speed drops.  LVAD equipment check completed and is in good working order.  Back-up equipment present.   LVAD education done on emergency procedures and precautions and reviewed exit site care.  CRITICAL CARE Performed by: Peder Bourdon  Total critical care time: 40 minutes  Critical care time was exclusive of separately billable procedures and treating other patients.  Critical care was necessary to treat or prevent imminent or life-threatening deterioration.  Critical care was time spent personally by me on the following activities: development of treatment plan with patient and/or surrogate as well as nursing, discussions with consultants, evaluation of patient's response to treatment, examination of patient, obtaining history from patient or surrogate, ordering and performing treatments and interventions, ordering and review of laboratory studies, ordering and review of radiographic studies, pulse oximetry and re-evaluation of patient's condition.   Length of Stay: 3  Peder Bourdon, MD 09/17/2023, 9:20 AM  VAD Team --- VAD ISSUES ONLY--- Pager [phone] (7am - 7am)  Advanced Heart Failure Team  Pager [phone] (M-F; 7a - 5p)  Please contact [facility] for night-coverage after  hours (5p -7a ) and weekends on [email or web address]

## 2023-09-17 NOTE — Progress Notes (Signed)
 NAME:  David [name], MRN:  [id number], DOB:  07/30/1965, LOS: 3 ADMISSION DATE:  09/14/2023, CONSULTATION DATE:  5/22 REFERRING MD:  Bensimhon, CHIEF COMPLAINT:  resp failure   History of Present Illness:  This is a 58 year old male w/ advanced systolic HF, CAD, HTn, CKD stage IIIA. He has been on HM III LVAD (which is his destination d/t CKD) since 2019. Last hospital stay April 2025 for acute HF, prob sepsis, also complicated by persistent hypoxia felt possibly mixed picture of PNA  (felt influenza A PNA w/ post viral PNA) vs amidarone toxicity. During this stay diuresed 15lbs fluid off. Speed of VAD increased and sent home on oxygen.  Followed at [facility] clinic for coumadin  dosing and LVAD clinic post discharge in  April at HF clinic: O2 sats 4 lpm 95% and 92-93% on room air.  5/2 VAD clinic eval as felt had shock by ICD. Event eval showed AFL sustained VT, 1 burst of ATP followed by 40J of HV therapy converting back to AFL/VS." See EP RN note for further details. Amiodarone  resumed.   5/20 nursing calling HF clinic reporting increased exertional shortness of breath, advised to inc Oxygen from 2 to 4 lpm. Clinic f/u scheduled for 5/23 Presents to ER 5/24  5/22 presented to ER w/ worsening SOB, NP dry cough, wt up 2 lbs. + orthopnea. Pulse ox 86% on 2 lpm RR 40s. Marked distress SOB worse in ER. W/ progressive hypoxia. PCXR w/ diffuse pulmonary edema.  Placed on NIPPV, w/ no improvement so PCCM called by HF team for intubation and Critical care support.  Pertinent  Medical History  HFrEF s/p HMIII LVAD implant 2019, cardiogenic shock,recent hospital stay for acute HF and presumed infection, during this stay VAD speed increased to 5600, (last op visit VAD speed 5600, flow 5.3) chronic AC w/ warfarin, gastric AVMs, afib, prior DC/CV, ICD,SAH, HTN, urinary retention, concern for amiodarone  toxicity also during this stay, CKD stage 3a. Prior GIB off ASA  Significant Hospital Events: Including  procedures, antibiotic start and stop dates in addition to other pertinent events   5/22 admitted w/ acute on chronic HF  5/24 extubated  Interim History / Subjective:  Looks great! Denies pain + cough, dry No aspiration symptoms No GERD or sinus symptoms No wheezing  Objective    Blood pressure 93/77, pulse 100, temperature 98 F (36.7 C), temperature source Oral, resp. rate 14, height 5\' 5"  (1.651 m), weight 49 kg, SpO2 93%. CVP:  [10 mmHg-28 mmHg] 17 mmHg  Vent Mode: Stand-by FiO2 (%):  [40 %] 40 % PEEP:  [5 cmH20] 5 cmH20 Pressure Support:  [7 cmH20] 7 cmH20   Intake/Output Summary (Last 24 hours) at 09/17/2023 0813 Last data filed at 09/17/2023 0645 Gross per 24 hour  Intake 3269.95 ml  Output 6065 ml  Net -2795.05 ml   Filed Weights   09/15/23 0458 09/16/23 0500 09/17/23 0500  Weight: 55.3 kg 49.3 kg 49 kg    Examination: No distress LVAD hum Moves to command Secretion burden improved per RN Ext warm  Coox 72 Cr improving  Resolved problem list   Assessment and Plan    AoC systolic HF HM III LVAD in situ  Persistent Afib  Hx VT ICD in situ Hx CAD Hx HTN  Possible sepsis 2/2 PNA  Acute resp failure w hypoxia  Pulm edema vs PNA vs amio lung tox  AKI on CKD 3a: c/w cardiorenal Hx urinary retention  Hx SAH  DC  A line DC steroids 5 days abx for CAP (low suspcion) Diuresis per AHF Speaking with him this all seems related to fluid: insidious onset, rapid improvement with fluid removal Voiding trial, continue tamsulosin  Assymetric nature of lung issues and low ESR is atypical for amio lung toxicity: will check MBSS and esophagram to r/o aspiration; if continued lung issues despite assuring OP euvolemia then can do a deeper dive; please reach out to me if still in question; actual clinical need for amio is in question so if in doubt I guess just hold  Will be available PRN, discussed with AHF  Ardelle Kos MD PCCM

## 2023-09-17 NOTE — Plan of Care (Signed)
  Problem: Education: Goal: Patient will understand all VAD equipment and how it functions Outcome: Progressing Goal: Patient will be able to verbalize current INR target range and antiplatelet therapy for discharge home Outcome: Progressing   Problem: Cardiac: Goal: LVAD will function as expected and patient will experience no clinical alarms Outcome: Progressing   Problem: Activity: Goal: Ability to tolerate increased activity will improve Outcome: Progressing   Problem: Respiratory: Goal: Ability to maintain a clear airway and adequate ventilation will improve Outcome: Progressing   Problem: Role Relationship: Goal: Method of communication will improve Outcome: Progressing   Problem: Education: Goal: Knowledge of General Education information will improve Description: Including pain rating scale, medication(s)/side effects and non-pharmacologic comfort measures Outcome: Progressing   Problem: Health Behavior/Discharge Planning: Goal: Ability to manage health-related needs will improve Outcome: Progressing   Problem: Clinical Measurements: Goal: Ability to maintain clinical measurements within normal limits will improve Outcome: Progressing Goal: Will remain free from infection Outcome: Progressing Goal: Diagnostic test results will improve Outcome: Progressing Goal: Respiratory complications will improve Outcome: Progressing Goal: Cardiovascular complication will be avoided Outcome: Progressing   Problem: Activity: Goal: Risk for activity intolerance will decrease Outcome: Progressing   Problem: Nutrition: Goal: Adequate nutrition will be maintained Outcome: Progressing   Problem: Coping: Goal: Level of anxiety will decrease Outcome: Progressing   Problem: Elimination: Goal: Will not experience complications related to bowel motility Outcome: Progressing Goal: Will not experience complications related to urinary retention Outcome: Progressing   Problem:  Pain Managment: Goal: General experience of comfort will improve and/or be controlled Outcome: Progressing   Problem: Safety: Goal: Ability to remain free from injury will improve Outcome: Progressing   Problem: Skin Integrity: Goal: Risk for impaired skin integrity will decrease Outcome: Progressing

## 2023-09-17 NOTE — Progress Notes (Signed)
 SLP  Note  Patient Details Name: TIMOTEO CARREIRO MRN: [id number] DOB: 1966/02/29  Orders received for MBS.  Will proceed as radiology schedule permits.  D/W RN.  Tyrisha Benninger L. Beatris Lincoln, MA CCC/SLP Clinical Specialist - Acute Care SLP Acute Rehabilitation Services Office number [phone]           Myna Asal Laurice 09/17/2023, 1:59 PM

## 2023-09-17 NOTE — Progress Notes (Addendum)
 Pharmacy Electrolyte Replacement  Recent Labs:  Recent Labs    09/16/23 1637 09/17/23 0456 09/17/23 2015  K 3.8   < > 3.9  MG 2.1  --   --   PHOS 5.0*  --   --   CREATININE 2.55*   < > 2.50*   < > = values in this interval not displayed.    Low Critical Values (K </= 2.5, Phos </= 1, Mg </= 1) Present: K = 3.9 on furo gtt Mg 1.8 MD Contacted: [place]  Plan: Kdur 40meq x2 Mg 2g IV x1  Ivery Marking, PharmD, BCIDP, AAHIVP, CPP Infectious Disease Pharmacist 09/17/2023 9:08 PM

## 2023-09-17 NOTE — Progress Notes (Signed)
 PHARMACY - ANTICOAGULATION CONSULT NOTE  Pharmacy Consult for warfarin Indication: LVAD  Allergies  Allergen Reactions   Plavix [Clopidogrel Bisulfate] Hives    Patient Measurements: Height: 5\' 5"  (165.1 cm) Weight: 49 kg (108 lb 0.4 oz) IBW/kg (Calculated) : 61.5  Vital Signs: Temp: 97.8 F (36.6 C) (05/25 1115) Temp Source: Oral (05/25 1115) BP: 94/82 (05/25 1200) Pulse Rate: 78 (05/25 1201)  Labs: Recent Labs    09/14/23 1434 09/14/23 1530 09/14/23 1647 09/14/23 1852 09/15/23 0450 09/15/23 1212 09/16/23 0435 09/16/23 1138 09/16/23 1637 09/17/23 0456  HGB 12.9*  --   --    < > 11.6*  --  11.2* 12.9*  --  11.6*  HCT 41.7  --   --    < > 37.9*  --  35.8* 38.0*  --  36.2*  PLT 285  --   --   --  216  --  227  --   --  255  LABPROT  --    < >  --   --  20.5*  --  29.9*  --   --  24.5*  INR  --    < >  --   --  1.7*  --  2.8*  --   --  2.2*  CREATININE 2.19*  --   --   --  2.75*   < > 2.63*  --  2.55* 2.41*  TROPONINIHS 23*  --  21*  --   --   --   --   --   --   --    < > = values in this interval not displayed.    Estimated Creatinine Clearance: 22.9 mL/min (A) (by C-G formula based on SCr of 2.41 mg/dL (H)).   Medical History: Past Medical History:  Diagnosis Date   AICD (automatic cardioverter/defibrillator) present    Anemia    Anxiety    CAD (coronary artery disease) 2009   AMI in 12/2007 with PCI to LAD, staged PCI to  mid/distal RCA, NSTEMI in 02/2009 with BMS to LCx   CHF (congestive heart failure) (HCC)    Chronic kidney disease 11/03/2016   stage 3 kidney disease   Dysrhythmia    "arrythmia problems at some point", "fatal rhythms"   History of blood transfusion    "I was bleeding from was rectum" (04/19/2017)   HLD (hyperlipidemia)    HTN (hypertension)    Hyperlipidemia 03/15/2011   Ischemic cardiomyopathy    Admitted in 07/2010 with CHF exacerbation   Myocardial infarction (HCC)    "I've had 4" (04/19/2017)   Pneumonia 2018 X 1   Seizures  (HCC)    one seizure in 04/2016 during cardiac event (04/19/2017)   STEMI 2009 (anterior), 2010 (lateral), and 2012 (inferior) 03/14/2011     Assessment: 57 yoM with HM3 LVAD on warfarin PTA admitted with hypoxia requiring intubation. Pharmacy to manage warfarin. INR goal 1.5-2, today is 1.7. CBC ok, LDH stable. Home dose is 1mg  daily.  INR with quick jump to 2.8 today ?cause Could be interaction with doxycycline  but seems to fast.  Back down to 2.2 today after holding Coumadin  dose last night.  Goal of Therapy:  INR 1.5-2 Monitor platelets by anticoagulation protocol: Yes   Plan:  No Coumadin  tonight.  Will likely resume tomorrow once INR back in reduced goal range. Daily INR  [name], Davey Erp, [facility] Clinical Pharmacist  09/17/2023 1:46 PM   [facility] pharmacy phone numbers are listed on [email or web address]

## 2023-09-18 ENCOUNTER — Inpatient Hospital Stay (HOSPITAL_COMMUNITY)

## 2023-09-18 DIAGNOSIS — Z95811 Presence of heart assist device: Secondary | ICD-10-CM

## 2023-09-18 DIAGNOSIS — I5023 Acute on chronic systolic (congestive) heart failure: Secondary | ICD-10-CM | POA: Diagnosis not present

## 2023-09-18 LAB — BASIC METABOLIC PANEL WITH GFR
Anion gap: 16 — ABNORMAL HIGH (ref 5–15)
BUN: 65 mg/dL — ABNORMAL HIGH (ref 6–20)
CO2: 35 mmol/L — ABNORMAL HIGH (ref 22–32)
Calcium: 9.6 mg/dL (ref 8.9–10.3)
Chloride: 85 mmol/L — ABNORMAL LOW (ref 98–111)
Creatinine, Ser: 2.46 mg/dL — ABNORMAL HIGH (ref 0.61–1.24)
GFR, Estimated: 29 mL/min — ABNORMAL LOW (ref >60)
Glucose, Bld: 129 mg/dL — ABNORMAL HIGH (ref 70–99)
Potassium: 4.4 mmol/L (ref 3.5–5.1)
Sodium: 136 mmol/L (ref 135–145)

## 2023-09-18 LAB — GLUCOSE, CAPILLARY
Glucose-Capillary: 130 mg/dL — ABNORMAL HIGH (ref 70–99)
Glucose-Capillary: 186 mg/dL — ABNORMAL HIGH (ref 70–99)
Glucose-Capillary: 104 mg/dL — ABNORMAL HIGH (ref 70–99)
Glucose-Capillary: 110 mg/dL — ABNORMAL HIGH (ref 70–99)
Glucose-Capillary: 188 mg/dL — ABNORMAL HIGH (ref 70–99)
Glucose-Capillary: 100 mg/dL — ABNORMAL HIGH (ref 70–99)

## 2023-09-18 LAB — CBC
HCT: 41.3 % (ref 39.0–52.0)
Hemoglobin: 13.1 g/dL (ref 13.0–17.0)
MCH: 24.3 pg — ABNORMAL LOW (ref 26.0–34.0)
MCHC: 31.7 g/dL (ref 30.0–36.0)
MCV: 76.8 fL — ABNORMAL LOW (ref 80.0–100.0)
Platelets: 302 10*3/uL (ref 150–400)
RBC: 5.38 MIL/uL (ref 4.22–5.81)
RDW: 15.8 % — ABNORMAL HIGH (ref 11.5–15.5)
WBC: 14.2 10*3/uL — ABNORMAL HIGH (ref 4.0–10.5)
nRBC: 0 % (ref 0.0–0.2)

## 2023-09-18 LAB — COOXEMETRY PANEL
Carboxyhemoglobin: 2.1 % — ABNORMAL HIGH (ref 0.5–1.5)
Methemoglobin: 0.8 % (ref 0.0–1.5)
O2 Saturation: 80.7 %
Total hemoglobin: 13.4 g/dL (ref 12.0–16.0)

## 2023-09-18 LAB — LACTATE DEHYDROGENASE: LDH: 290 U/L — ABNORMAL HIGH (ref 98–192)

## 2023-09-18 LAB — PROTIME-INR
INR: 2 — ABNORMAL HIGH (ref 0.8–1.2)
Prothrombin Time: 23.3 s — ABNORMAL HIGH (ref 11.4–15.2)

## 2023-09-18 MED ORDER — MAGNESIUM SULFATE 2 GM/50ML IV SOLN
2.0000 g | Freq: Once | INTRAVENOUS | Status: AC
  Administered 2023-09-18: 2 g via INTRAVENOUS
  Filled 2023-09-18: qty 50

## 2023-09-18 MED ORDER — ORAL CARE MOUTH RINSE: 15.0000 mL | OROMUCOSAL | Status: DC | PRN

## 2023-09-18 MED ORDER — ACETAZOLAMIDE SODIUM 500 MG IJ SOLR
500.0000 mg | Freq: Once | INTRAMUSCULAR | Status: AC
  Administered 2023-09-18: 500 mg via INTRAVENOUS
  Filled 2023-09-18: qty 500

## 2023-09-18 MED ORDER — WARFARIN 0.5 MG HALF TABLET
0.5000 mg | ORAL_TABLET | Freq: Once | ORAL | Status: DC
  Filled 2023-09-18: qty 1

## 2023-09-18 MED ORDER — SODIUM CHLORIDE 0.9 % IV SOLN
2.0000 g | Freq: Once | INTRAVENOUS | Status: AC
  Administered 2023-09-18: 2 g via INTRAVENOUS
  Filled 2023-09-18: qty 20

## 2023-09-18 MED ORDER — TAMSULOSIN HCL 0.4 MG PO CAPS
0.4000 mg | ORAL_CAPSULE | Freq: Every day | ORAL | Status: DC
  Administered 2023-09-18 – 2023-09-23 (×6): 0.4 mg via ORAL
  Filled 2023-09-18 (×6): qty 1

## 2023-09-18 NOTE — Progress Notes (Signed)
 Patient ID: David [name], male   DOB: 1965/08/15, 58 y.o.   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   Several recent admits for respiratory distress, initially with flu A then with HF/post-viral syndrome.   At last admit concern for amio toxicity but ESR ok. Amio stopped but restarted 08/25/23 after ICD shock for VT.   Now readmitted w/ several days progressive/severe SOB. In ER in extremis. CXR with diffuse bilateral infiltrates. Intubated by CCM.  Subjective/Interval Hx:    Extubated on 5/24, now on 4L [place].  5/25 LVAD speed increased to 5700   Echo this admission with EF < 20% with interventricular septum slightly right-ward, moderate RV dysfunction/moderate RV enlargement. IVC dilated.   Diuresing with lasix  drip 40 mg per hour.   Denies SOB. Complaining hiccups.   LVAD INTERROGATION:  HeartMate III LVAD:   Flow 5 liters/min, speed 5700, power 5 , PI 4.5  LDH 243>290   Objective:    Vital Signs:   Temp:  [97.5 F (36.4 C)-98.7 F (37.1 C)] 97.5 F (36.4 C) (05/26 0800) Pulse Rate:  [54-148] 71 (05/26 0400) Resp:  [12-30] 20 (05/26 0800) BP: (71-135)/(60-92) (P) 88/77 (05/26 1000) SpO2:  [89 %-98 %] 95 % (05/26 0807) Weight:  [64.3 kg] 64.3 kg (05/26 0525) Last BM Date :  (PTA) Mean arterial Pressure 80s  Intake/Output:   Intake/Output Summary (Last 24 hours) at 09/18/2023 1029 Last data filed at 09/18/2023 1024 Gross per 24 hour  Intake 2841.86 ml  Output 4965 ml  Net -2123.14 ml   CVP 8   Physical Exam   Physical Exam: GENERAL: No acute distress. NECK: Supple, JVP  7-8  .  2+ bilaterally, no bruits.  No lymphadenopathy or thyromegaly appreciated.   CARDIAC:  Mechanical heart sounds with LVAD hum present.  LUNGS:  Clear to auscultation  bilaterally. On 2 liters [place]  ABDOMEN:  Soft, round, nontender, positive bowel sounds x4.     LVAD exit site:  Dressing dry and intact.  No erythema or drainage.  Stabilization device present and accurately applied.  EXTREMITIES:  Warm and dry, no cyanosis, clubbing, rash or edema  NEUROLOGIC:  Alert and oriented x 3.    Telemetry   Atrial flutter, 70s. Personally reviewed   EKG    N/A   Labs   Basic Metabolic Panel: Recent Labs  Lab 09/15/23 1212 09/15/23 1750 09/16/23 0435 09/16/23 1138 09/16/23 1637 09/17/23 0456 09/17/23 2015 09/18/23 0335  NA 138 138 142 144 139 136 133* 136  K 5.1 4.1 3.6 4.0 3.8 3.7 3.9 4.4  CL 105 103 103  --  100 91* 86* 85*  CO2 23 23 27   --  26 30 32 35*  GLUCOSE 183* 153* 144*  --  132* 136* 125* 129*  BUN 28* 34* 41*  --  51* 57* 61* 65*  CREATININE 3.23* 2.94* 2.63*  --  2.55* 2.41* 2.50* 2.46*  CALCIUM  8.8* 8.9 9.1  --  8.8* 9.1 8.9 9.6  MG 1.8 2.7* 2.3  --  2.1  --  1.8  --   PHOS 4.9* 3.6 3.6  --  5.0*  --   --   --     Liver Function Tests: Recent Labs  Lab 09/14/23  1434  AST 36  ALT 21  ALKPHOS 120  BILITOT 1.9*  PROT 8.2*  ALBUMIN  2.9*   Recent Labs  Lab 09/14/23 1434  LIPASE 33   No results for input(s): "AMMONIA" in the last 168 hours.  CBC: Recent Labs  Lab 09/14/23 1434 09/14/23 1852 09/15/23 0450 09/16/23 0435 09/16/23 1138 09/17/23 0456 09/18/23 0335  WBC 13.9*  --  6.8 9.0  --  13.8* 14.2*  NEUTROABS 11.6*  --   --   --   --   --   --   HGB 12.9*   < > 11.6* 11.2* 12.9* 11.6* 13.1  HCT 41.7   < > 37.9* 35.8* 38.0* 36.2* 41.3  MCV 81.9  --  81.0 79.6*  --  78.2* 76.8*  PLT 285  --  216 227  --  255 302   < > = values in this interval not displayed.    INR: Recent Labs  Lab 09/14/23 1530 09/15/23 0450 09/16/23 0435 09/17/23 0456 09/18/23 0335  INR 1.7* 1.7* 2.8* 2.2* 2.0*    Other results: EKG:    Imaging   No results found.    Medications:     Scheduled Medications:   atorvastatin   40 mg Oral Daily   Chlorhexidine  Gluconate Cloth  6 each Topical Daily   insulin  aspart  2-6 Units Subcutaneous Q4H   pantoprazole  (PROTONIX ) IV  40 mg Intravenous Daily   revefenacin  175 mcg Nebulization Daily   thiamine  100 mg Oral Daily   traZODone   50 mg Oral QHS   Warfarin - Pharmacist Dosing Inpatient   Does not apply q1600    Infusions:  cefTRIAXone  (ROCEPHIN )  IV Stopped (09/17/23 1714)   doxycycline  (VIBRAMYCIN ) IV 125 mL/hr at 09/18/23 0655   furosemide  (LASIX ) 200 mg in dextrose  5 % 100 mL (2 mg/mL) infusion 40 mg/hr (09/18/23 0841)    PRN Medications: acetaminophen , ipratropium-albuterol , ondansetron  (ZOFRAN ) IV, mouth rinse    Assessment/Plan:    1. Acute Hypoxic Respiratory Failure: admitted earlier in the month with AHRF. ?PNA post viral infection. Newly requiring home O2. Presented this admission with respiratory distress despite bipap requiring intubation. Now extubated.  - Hi-Res chest CT 4/25: GG airspace disease in right lung, pulmonary edema, ATX left base, emphysema. Concern for amiodarone  lung toxicity but would also consider aspiration PNA.  - Respiratory panel negative  - Concern for amio toxicity last admission, recently placed back on amiodarone  for ICD shock. Continued concern for amiodarone  lung toxicity this admission, though ESR not significantly high (31), CRP 3.9. - Continue off amiodarone . Reviewed CT with Dr. Felipe Horton, think problem is more likely volume overload than amiodarone  lung toxicity.  Solumedrol stopped.  - On empiric abx for possible PNA, ceftriaxone /doxy (PCT 0.94).  - Continues to diurese. Oxygen requirements.  - Swallow Evaluation pending.   2. Chronic systolic CHF with HM III LVAD implant 04/20/17 - RHC 4/15: RA 6, PA mean 35, PCWP 19, Fick CI 3.1, TD CI 3.0. Speed 5600 - Concern for RV dysfunction given HF presentation - I reviewed echo, EF < 20% with interventricular septum slightly right-ward, moderate RV  dysfunction/moderate RV enlargement. IVC dilated.  With rightward septum,  speed to 5700 rpm.  - Co-ox 81%, no indication for inotrope.  -CVP 8. Cut back lasix  drip 30 mg per hour. Given diamox 500 mg IV x1.  - GDMT limited by renal fx/hyperkalemia   - MAP 80s stable.    3. VAD management - VAD interrogated personally.  Parameters stable.  - INR 2.2 (goal 1.5-2). PharmD dosing warfarin.  - LDH 186 => 243=>290  4. AKI on CKD stage 3: baseline SCr 1.5-1.8 - sCr 2.19 on admission>>2.75>>2.63>>2.41 >>2.46 today  - suspect renal venous congestion, creatinine trending down with diuresis. - avoid hypotension - follow BMET    5. Hx of Ventricular tachycardia - Had ICD shock x 2 for VT on 11/24 (device reprogrammed at the time) - S/p fracture lead removal and ICD exchange 1/25 - ICD shock for VT 5/1, amio resumed - Stopped amiodarone  again with concern for lung toxicity - No VT    6. Persistent AFL - most recent ICD interrogation with 100% AF burden - Rate 80s  - on warfarin; now off amiodarone    7. CAD:  s/p PCI RCA/PLOM with DES x2 and DES to mild LAD 1/18 - denied CP on admit - Continue statin. Off ASA due to GIB/AVMs   8. Urinary retention:  - Add flomax .  - Bladder scan as needed.   9. Hyperkalemia - Resolved.   OOB  I reviewed the LVAD parameters from today, and compared the results to the patient's prior recorded data.  No programming changes were made.  The LVAD is functioning within specified parameters.  The patient performs LVAD self-test daily.  LVAD interrogation was negative for any significant power changes, alarms or PI events/speed drops.  LVAD equipment check completed and is in good working order.  Back-up equipment present.   LVAD education done on emergency procedures and precautions and reviewed exit site care.  CRITICAL CARE Performed by: Nieves Bars  Total critical care time: 40 minutes  Critical care time was exclusive of separately billable procedures and  treating other patients.  Critical care was necessary to treat or prevent imminent or life-threatening deterioration.  Critical care was time spent personally by me on the following activities: development of treatment plan with patient and/or surrogate as well as nursing, discussions with consultants, evaluation of patient's response to treatment, examination of patient, obtaining history from patient or surrogate, ordering and performing treatments and interventions, ordering and review of laboratory studies, ordering and review of radiographic studies, pulse oximetry and re-evaluation of patient's condition.   Length of Stay: 4  Nieves Bars, NP 09/18/2023, 10:29 AM  VAD Team --- VAD ISSUES ONLY--- Pager [phone] (7am - 7am)  Advanced Heart Failure Team  Pager [phone] (M-F; 7a - 5p)  Please contact [facility] for night-coverage after hours (5p -7a ) and weekends on [email or web address]

## 2023-09-18 NOTE — Plan of Care (Signed)
  Problem: Education: Goal: Patient will understand all VAD equipment and how it functions Outcome: Progressing   Problem: Activity: Goal: Ability to tolerate increased activity will improve Outcome: Progressing   Problem: Respiratory: Goal: Ability to maintain a clear airway and adequate ventilation will improve Outcome: Progressing   Problem: Role Relationship: Goal: Method of communication will improve Outcome: Progressing   Problem: Education: Goal: Knowledge of General Education information will improve Description: Including pain rating scale, medication(s)/side effects and non-pharmacologic comfort measures Outcome: Progressing   Problem: Clinical Measurements: Goal: Ability to maintain clinical measurements within normal limits will improve Outcome: Progressing Goal: Will remain free from infection Outcome: Progressing   Problem: Activity: Goal: Risk for activity intolerance will decrease Outcome: Progressing   Problem: Coping: Goal: Level of anxiety will decrease Outcome: Progressing   Problem: Elimination: Goal: Will not experience complications related to urinary retention Outcome: Progressing   Problem: Pain Managment: Goal: General experience of comfort will improve and/or be controlled Outcome: Progressing   Problem: Safety: Goal: Ability to remain free from injury will improve Outcome: Progressing   Problem: Skin Integrity: Goal: Risk for impaired skin integrity will decrease Outcome: Progressing

## 2023-09-18 NOTE — Progress Notes (Addendum)
 PHARMACY - ANTICOAGULATION CONSULT NOTE  Pharmacy Consult for warfarin Indication: LVAD  Allergies  Allergen Reactions   Plavix [Clopidogrel Bisulfate] Hives    Patient Measurements: Height: 5\' 5"  (165.1 cm) Weight: 64.3 kg (141 lb 12.1 oz) (weighed without controller) IBW/kg (Calculated) : 61.5  Vital Signs: Temp: 98.7 F (37.1 C) (05/26 0326) Temp Source: Oral (05/26 0326) BP: 95/83 (05/26 0600) Pulse Rate: 71 (05/26 0400)  Labs: Recent Labs    09/16/23 0435 09/16/23 1138 09/16/23 1637 09/17/23 0456 09/17/23 2015 09/18/23 0335  HGB 11.2* 12.9*  --  11.6*  --  13.1  HCT 35.8* 38.0*  --  36.2*  --  41.3  PLT 227  --   --  255  --  302  LABPROT 29.9*  --   --  24.5*  --  23.3*  INR 2.8*  --   --  2.2*  --  2.0*  CREATININE 2.63*  --    < > 2.41* 2.50* 2.46*   < > = values in this interval not displayed.    Estimated Creatinine Clearance: 28.1 mL/min (A) (by C-G formula based on SCr of 2.46 mg/dL (H)).   Medical History: Past Medical History:  Diagnosis Date   AICD (automatic cardioverter/defibrillator) present    Anemia    Anxiety    CAD (coronary artery disease) 2009   AMI in 12/2007 with PCI to LAD, staged PCI to  mid/distal RCA, NSTEMI in 02/2009 with BMS to LCx   CHF (congestive heart failure) (HCC)    Chronic kidney disease 11/03/2016   stage 3 kidney disease   Dysrhythmia    "arrythmia problems at some point", "fatal rhythms"   History of blood transfusion    "I was bleeding from was rectum" (04/19/2017)   HLD (hyperlipidemia)    HTN (hypertension)    Hyperlipidemia 03/15/2011   Ischemic cardiomyopathy    Admitted in 07/2010 with CHF exacerbation   Myocardial infarction (HCC)    "I've had 4" (04/19/2017)   Pneumonia 2018 X 1   Seizures (HCC)    one seizure in 04/2016 during cardiac event (04/19/2017)   STEMI 2009 (anterior), 2010 (lateral), and 2012 (inferior) 03/14/2011     Assessment: 57 yoM with HM3 LVAD on warfarin PTA admitted with  hypoxia requiring intubation. Pharmacy to manage warfarin. INR goal 1.5-2. Home dose is 1mg  daily.  INR is at upper end of therapeutic goal at 2.0 after holding warfarin x2 days. Last dose of warfarin 1 mg x1 was given 09/15/23. CBC and LDH stable with no signs of bleeding.  Goal of Therapy:  INR 1.5-2 Monitor platelets by anticoagulation protocol: Yes   Plan:  Hold warfarin tonight. Low threshold to re-dose once INR <2.0 Monitor INR, CBC, LHD, and s/sx of bleeding.  Albino Alu, PharmD PGY2 Cardiology Pharmacy Resident  09/18/2023 6:32 AM   [facility] pharmacy phone numbers are listed on [email or web address]

## 2023-09-18 NOTE — TOC Progression Note (Signed)
 Transition of Care [facility]) - Progression Note    Patient Details  Name: David [name] MRN: [id number] Date of Birth: 1965/07/30  Transition of [facility]) CM/SW Contact  Ernst Heap Phone Number: [phone] 09/18/2023, 3:23 PM  Clinical Narrative:   3:02 PM- HF CSW attempted to meet with patient at bedside. Patient was being seen by nursing staff. CSW will follow up with patient at a more appropriate time.   TOC will continue following.          Expected Discharge Plan and Services                                               Social Determinants of Health (SDOH) Interventions SDOH Screenings   Food Insecurity: No Food Insecurity (09/16/2023)  Housing: Low Risk  (09/16/2023)  Transportation Needs: No Transportation Needs (09/16/2023)  Utilities: Not At Risk (09/16/2023)  Depression (PHQ2-9): High Risk (07/19/2021)  Financial Resource Strain: High Risk (07/04/2022)  Physical Activity: Inactive (03/10/2017)  Stress: No Stress Concern Present (03/10/2017)  Tobacco Use: Medium Risk (09/14/2023)    Readmission Risk Interventions    08/11/2023    1:58 PM  Readmission Risk Prevention Plan  Transportation Screening Complete  PCP or Specialist Appt within 5-7 Days Complete  Home Care Screening Complete  Medication Review (RN CM) Complete

## 2023-09-18 NOTE — Progress Notes (Signed)
 Modified Barium Swallow Study  Patient Details  Name: David [name] MRN: [id number] Date of Birth: 01-15-1966  Today's Date: 09/18/2023  Modified Barium Swallow completed.  Full report located under Chart Review in the Imaging Section.  History of Present Illness This is a 58 year old male arriving for SOB. Last hospital stay April 2025 for acute HF, prob sepsis, also complicated by persistent hypoxia felt possibly mixed picture of PNA  (felt influenza A PNA w/ post viral PNA) vs amidarone toxicity. During this stay diuresed 15lbs fluid off. Speed of VAD increased and sent home on oxygen. 5/22 presented to ER w/ worsening SOB, NP dry cough, wt up 2 lbs. + orthopnea. Pulse ox 86% on 2 lpm RR 40s. Marked distress  SOB worse in ER. W/ progressive hypoxia. PCXR w/ diffuse pulmonary edema.   Placed on NIPPV, w/ no improvement so intubated 5/22-5/24. Pt had MBS last hospitalization on 4/19 - MBS WNL. PMH: advanced systolic HF, CAD, HTn, CKD stage IIIA. He has been on HM III LVAD (which is his destination d/t CKD) since 2019.   Clinical Impression Pt demonstrates swallow function within normal limits. No timing or weakness appreciated. No penetration or aspiration. No dysphagia diagnosed and risk for prandial aspiration is low. Recommend pt continue current diet. Also note that there was an MBS on 08/12/23 that also showed normal swallowing. SLP will sign off. Factors that may increase risk of adverse event in presence of aspiration David [name] & David [name] 2021):    Swallow Evaluation Recommendations Recommendations: PO diet PO Diet Recommendation: Regular;Thin liquids (Level 0) Liquid Administration via: Cup;Straw Medication Administration: Whole meds with liquid Supervision: Patient able to self-feed Postural changes: Position pt fully upright for meals Oral care recommendations: Pt independent with oral care  David Bateman, MA CCC-SLP  Acute Rehabilitation Services Secure Chat Preferred Office  [phone]    David [name] 09/18/2023,9:21 AM

## 2023-09-19 ENCOUNTER — Inpatient Hospital Stay (HOSPITAL_COMMUNITY)

## 2023-09-19 DIAGNOSIS — Z95811 Presence of heart assist device: Secondary | ICD-10-CM | POA: Diagnosis not present

## 2023-09-19 DIAGNOSIS — I5023 Acute on chronic systolic (congestive) heart failure: Secondary | ICD-10-CM | POA: Diagnosis not present

## 2023-09-19 LAB — CBC
HCT: 46.6 % (ref 39.0–52.0)
Hemoglobin: 15.1 g/dL (ref 13.0–17.0)
MCH: 24.7 pg — ABNORMAL LOW (ref 26.0–34.0)
MCHC: 32.4 g/dL (ref 30.0–36.0)
MCV: 76.1 fL — ABNORMAL LOW (ref 80.0–100.0)
Platelets: 358 10*3/uL (ref 150–400)
RBC: 6.12 MIL/uL — ABNORMAL HIGH (ref 4.22–5.81)
RDW: 15.3 % (ref 11.5–15.5)
WBC: 16.1 10*3/uL — ABNORMAL HIGH (ref 4.0–10.5)
nRBC: 0 % (ref 0.0–0.2)

## 2023-09-19 LAB — BASIC METABOLIC PANEL WITH GFR
Anion gap: 18 — ABNORMAL HIGH (ref 5–15)
Anion gap: 17 — ABNORMAL HIGH (ref 5–15)
BUN: 79 mg/dL — ABNORMAL HIGH (ref 6–20)
BUN: 85 mg/dL — ABNORMAL HIGH (ref 6–20)
CO2: 36 mmol/L — ABNORMAL HIGH (ref 22–32)
CO2: 35 mmol/L — ABNORMAL HIGH (ref 22–32)
Calcium: 9.3 mg/dL (ref 8.9–10.3)
Calcium: 9 mg/dL (ref 8.9–10.3)
Chloride: 78 mmol/L — ABNORMAL LOW (ref 98–111)
Chloride: 81 mmol/L — ABNORMAL LOW (ref 98–111)
Creatinine, Ser: 2.53 mg/dL — ABNORMAL HIGH (ref 0.61–1.24)
Creatinine, Ser: 2.92 mg/dL — ABNORMAL HIGH (ref 0.61–1.24)
GFR, Estimated: 28 mL/min — ABNORMAL LOW (ref >60)
GFR, Estimated: 24 mL/min — ABNORMAL LOW (ref >60)
Glucose, Bld: 103 mg/dL — ABNORMAL HIGH (ref 70–99)
Glucose, Bld: 120 mg/dL — ABNORMAL HIGH (ref 70–99)
Potassium: 2.9 mmol/L — ABNORMAL LOW (ref 3.5–5.1)
Potassium: 3.3 mmol/L — ABNORMAL LOW (ref 3.5–5.1)
Sodium: 132 mmol/L — ABNORMAL LOW (ref 135–145)
Sodium: 133 mmol/L — ABNORMAL LOW (ref 135–145)

## 2023-09-19 LAB — COOXEMETRY PANEL
Carboxyhemoglobin: 1.9 % — ABNORMAL HIGH (ref 0.5–1.5)
Methemoglobin: 0.9 % (ref 0.0–1.5)
O2 Saturation: 71.7 %
Total hemoglobin: 15.6 g/dL (ref 12.0–16.0)

## 2023-09-19 LAB — GLUCOSE, CAPILLARY
Glucose-Capillary: 106 mg/dL — ABNORMAL HIGH (ref 70–99)
Glucose-Capillary: 108 mg/dL — ABNORMAL HIGH (ref 70–99)
Glucose-Capillary: 116 mg/dL — ABNORMAL HIGH (ref 70–99)
Glucose-Capillary: 117 mg/dL — ABNORMAL HIGH (ref 70–99)
Glucose-Capillary: 131 mg/dL — ABNORMAL HIGH (ref 70–99)
Glucose-Capillary: 223 mg/dL — ABNORMAL HIGH (ref 70–99)

## 2023-09-19 LAB — LACTATE DEHYDROGENASE: LDH: 338 U/L — ABNORMAL HIGH (ref 98–192)

## 2023-09-19 LAB — PROTIME-INR
INR: 2.2 — ABNORMAL HIGH (ref 0.8–1.2)
Prothrombin Time: 24.9 s — ABNORMAL HIGH (ref 11.4–15.2)

## 2023-09-19 LAB — MAGNESIUM: Magnesium: 2.8 mg/dL — ABNORMAL HIGH (ref 1.7–2.4)

## 2023-09-19 MED ORDER — FLUTICASONE PROPIONATE 50 MCG/ACT NA SUSP: 2.0000 | Freq: Every day | NASAL | Status: DC | PRN

## 2023-09-19 MED ORDER — ACETAMINOPHEN 500 MG PO TABS: 1000.0000 mg | ORAL_TABLET | Freq: Two times a day (BID) | ORAL | Status: DC | PRN

## 2023-09-19 MED ORDER — POTASSIUM CHLORIDE CRYS ER 20 MEQ PO TBCR
20.0000 meq | EXTENDED_RELEASE_TABLET | Freq: Once | ORAL | Status: DC
  Filled 2023-09-19: qty 1

## 2023-09-19 MED ORDER — AMIODARONE HCL 200 MG PO TABS
200.0000 mg | ORAL_TABLET | Freq: Every day | ORAL | Status: DC
  Administered 2023-09-19 – 2023-09-23 (×5): 200 mg via ORAL
  Filled 2023-09-19 (×5): qty 1

## 2023-09-19 MED ORDER — PANTOPRAZOLE SODIUM 40 MG PO TBEC
40.0000 mg | DELAYED_RELEASE_TABLET | Freq: Every day | ORAL | Status: DC
  Administered 2023-09-19 – 2023-09-23 (×5): 40 mg via ORAL
  Filled 2023-09-19 (×5): qty 1

## 2023-09-19 MED ORDER — TORSEMIDE 20 MG PO TABS: 20.0000 mg | ORAL_TABLET | Freq: Every day | ORAL | Status: DC

## 2023-09-19 MED ORDER — POTASSIUM CHLORIDE CRYS ER 20 MEQ PO TBCR
40.0000 meq | EXTENDED_RELEASE_TABLET | Freq: Once | ORAL | Status: AC
  Administered 2023-09-19: 40 meq via ORAL
  Filled 2023-09-19: qty 2

## 2023-09-19 MED ORDER — ATORVASTATIN CALCIUM 40 MG PO TABS: 40.0000 mg | ORAL_TABLET | Freq: Every day | ORAL | Status: DC

## 2023-09-19 MED ORDER — MAGNESIUM OXIDE -MG SUPPLEMENT 400 (240 MG) MG PO TABS
400.0000 mg | ORAL_TABLET | Freq: Two times a day (BID) | ORAL | Status: DC
  Administered 2023-09-19 – 2023-09-23 (×10): 400 mg via ORAL
  Filled 2023-09-19 (×10): qty 1

## 2023-09-19 MED ORDER — AMLODIPINE BESYLATE 5 MG PO TABS
5.0000 mg | ORAL_TABLET | Freq: Every day | ORAL | Status: DC
  Administered 2023-09-19: 5 mg via ORAL
  Filled 2023-09-19: qty 1

## 2023-09-19 MED ORDER — ADULT MULTIVITAMIN W/MINERALS CH
1.0000 | ORAL_TABLET | Freq: Every day | ORAL | Status: DC
  Administered 2023-09-19 – 2023-09-23 (×5): 1 via ORAL
  Filled 2023-09-19 (×5): qty 1

## 2023-09-19 MED ORDER — TAMSULOSIN HCL 0.4 MG PO CAPS: 0.4000 mg | ORAL_CAPSULE | Freq: Every day | ORAL | Status: DC

## 2023-09-19 MED ORDER — POTASSIUM CHLORIDE CRYS ER 20 MEQ PO TBCR: 20.0000 meq | EXTENDED_RELEASE_TABLET | Freq: Every day | ORAL | Status: DC

## 2023-09-19 MED ORDER — POTASSIUM CHLORIDE CRYS ER 20 MEQ PO TBCR
30.0000 meq | EXTENDED_RELEASE_TABLET | Freq: Once | ORAL | Status: AC
  Administered 2023-09-19: 30 meq via ORAL
  Filled 2023-09-19: qty 1

## 2023-09-19 MED ORDER — WARFARIN SODIUM 1 MG PO TABS: 1.0000 mg | ORAL_TABLET | Freq: Every day | ORAL | Status: DC

## 2023-09-19 NOTE — Plan of Care (Signed)
  Problem: Education: Goal: Patient will understand all VAD equipment and how it functions Outcome: Progressing Goal: Patient will be able to verbalize current INR target range and antiplatelet therapy for discharge home Outcome: Progressing   Problem: Cardiac: Goal: LVAD will function as expected and patient will experience no clinical alarms Outcome: Progressing   Problem: Activity: Goal: Ability to tolerate increased activity will improve Outcome: Progressing   Problem: Respiratory: Goal: Ability to maintain a clear airway and adequate ventilation will improve Outcome: Progressing

## 2023-09-19 NOTE — Discharge Summary (Signed)
 Advanced Heart Failure Team  Discharge Summary   Patient ID: David [name] MRN: [id number], DOB/AGE: 60/04/1965 59 y.o. Admit date: 09/14/2023 D/C date:     09/23/23   Primary Discharge Diagnoses:  Acute hypoxic respiratory failure  Secondary Discharge Diagnoses:  Acute on Chronic Systolic CHF s/p HM III LVAD AKI on CKD stage 3 Hx of VT Persistent AFL CAD Urinary retention Hypokalemia  Hospital Course:  David [name] is a 58 y.o. male with history of CAD ('09 PCI to LAD and mRCA, '10 PCI to Lcx and '12 mRCA), HFrEF s/p HM III VAD in 12/19, CKD3, hx GI bleed d/t gastric AVMs, chronic AF/AFL, SAH, HLD, and HTN.    Admitted in 2/23 with SAH in setting of HTN. Underwent ventriculostomy. Admission c/b by periods of confusion and urinary retention.    Was seen for acute visit on 12/24 for ICD firing x 2 on Thanksgiving. Seen by Dr. Rodolfo Clan and ICD reprogrammed. Found to have lead fracture. Now s/p lead extraction and new ICD 1/25 c/b small chest hematoma.    He was admitted from VAD clinic twice in [place] 25. One admission for hypovolemia and AKI in the setting of Flu A. The second for volume overload and PNA, treated with abx. RHC during most recent admission (after diuresis) showed optimized hemodynamics, however had persistent hypoxia. CT chest showed GG airspace disease in right lung, pulmonary edema, ATX left base, emphysema. There was some concern for possible amio toxicity, therefore amio stopped. Was discharged with home O2.    Of note, had ICD shock on 08/24/23. Interrogation showed VT terminated by ATP with 40J HV therapy. Amio resumed 5/2.   Readmitted on 09/14/23 with acute respiratory failure requiring intubation. He was treated with antibiotics for possible PNA and aggressively diuresed. He was also treated with IV steroids for possible amiodarone  toxicity (CCM later felt this was not likely after reviewing HiRes CT).  Steroids were discontinued. It was suspected respiratory  failure was due to a/c CHF w/ volume overload. Echo with EF < 20% with interventricular septum slightly right-ward, moderate RV dysfunction/moderate RV enlargement. Speed subsequently increased to 5700 on VAD. Patient continued to have intt speed drops so he was taken for RHC after diuresis. RHC 09/22/23 showed normal left and right heart filling pressures, no significant change in hemodynamics but mild improvement in filling pressures with bolus, normal cardiac output/index on 5600 RPM. No further adjustments/speed changes given compensated hemodynamics. Recommendations were to consider cardiomems implantation in the future given multiple admissions for volume overload.   On 5/31, he was last seen and examined by Dr. Bruce Caper and felt stable for d/c back home. Home health RN and PT arranged for patient.   Pt will continue to be followed closely in the LVAD clinic. Follow up arranged.    Discharge Weight Range: charted as 138 lb (?accuracy of hospital wts)  Discharge Vitals: Blood pressure 93/75, pulse 76, temperature 98.3 F (36.8 C), temperature source Oral, resp. rate 14, height 5\' 5"  (1.651 m), weight 63 kg, SpO2 98%.  Labs: Lab Results  Component Value Date   WBC 12.5 (H) 09/23/2023   HGB 13.4 09/23/2023   HCT 41.6 09/23/2023   MCV 76.8 (L) 09/23/2023   PLT 285 09/23/2023    Recent Labs  Lab 09/22/23 0539 09/22/23 1321  NA 132* 133*  133*  K 4.1 3.8  3.8  CL 95*  --   CO2 28  --   BUN 55*  --   CREATININE  2.13*  --   CALCIUM  9.1  --   GLUCOSE 81  --    Lab Results  Component Value Date   CHOL 178 04/02/2018   HDL 42 04/02/2018   LDLCALC 111 (H) 04/02/2018   TRIG 124 04/02/2018   BNP (last 3 results) Recent Labs    08/03/23 0904 09/14/23 1647  BNP 470.3* 408.6*    ProBNP (last 3 results) No results for input(s): "PROBNP" in the last 8760 hours.   Diagnostic Studies/Procedures   RHC 09/22/23 IMPRESSION: Right heart catheterization for volume  assessment in LVAD patient with recent admission for volume overload Normal left and right heart filling pressures No significant change in hemodynamics but mild improvement in filling pressures with 250mL bolus Normal cardiac output/index on 5600 RPM No change to speed given compensated hemodynamics   RECOMMENDATIONS: Consider cardiomems implantation given multiple admissions for volume overload   2D Echo 09/20/23 1. LVAD in the left ventricular apex. LVAD speed initially 5700 RPM.  Reduced to 5600 RPM at the end of the study with improvement in RV  function. Left ventricular ejection fraction, by estimation, is <20%. The  left ventricle has severely decreased  function. There is the interventricular septum is flattened in systole and  diastole, consistent with right ventricular pressure and volume overload.   2. Right ventricular systolic function is severely reduced. The right  ventricular size is severely enlarged. There is normal pulmonary artery  systolic pressure.   3. The mitral valve is normal in structure. No evidence of mitral valve  regurgitation. No evidence of mitral stenosis.   4. The aortic valve is tricuspid.      Discharge Medications   Allergies as of 09/23/2023       Reactions   Plavix [clopidogrel Bisulfate] Hives        Medication List     STOP taking these medications    amLODipine  5 MG tablet Commonly known as: NORVASC        TAKE these medications    acetaminophen  500 MG tablet Commonly known as: TYLENOL  Take 1,000 mg by mouth 2 (two) times daily as needed for moderate pain (pain score 4-6), fever or headache.   amiodarone  200 MG tablet Commonly known as: PACERONE  Take 1 tablet (200 mg total) by mouth daily.   atorvastatin  40 MG tablet Commonly known as: LIPITOR Take 1 tablet (40 mg total) by mouth daily.   fluticasone  50 MCG/ACT nasal spray Commonly known as: FLONASE  Place 2 sprays into both nostrils daily. What changed:  when to  take this reasons to take this   magnesium  oxide 400 (240 Mg) MG tablet Commonly known as: MAG-OX Take 1 tablet (400 mg total) by mouth 2 (two) times daily.   Mens 50+ Multivitamin Tabs Take 1 tablet by mouth daily.   pantoprazole  40 MG tablet Commonly known as: PROTONIX  Take 1 tablet (40 mg total) by mouth daily.   potassium chloride  SA 20 MEQ tablet Commonly known as: KLOR-CON  M Take 1 tablet (20 mEq total) by mouth daily.   tamsulosin  0.4 MG Caps capsule Commonly known as: FLOMAX  Take 1 capsule (0.4 mg total) by mouth daily.   torsemide  20 MG tablet Commonly known as: DEMADEX  Take 1 tablet (20 mg total) by mouth daily.   traZODone  50 MG tablet Commonly known as: DESYREL  Take 1 tablet (50 mg total) by mouth at bedtime.   warfarin 2 MG tablet Commonly known as: COUMADIN  Take as directed. If you are unsure how to take this medication,  talk to your nurse or doctor. Original instructions: TAKE 1 MG daily per VAD Pharm D What changed:  how much to take how to take this when to take this additional instructions        Disposition   The patient will be discharged in stable condition to home. Discharge Instructions     Diet - low sodium heart healthy   Complete by: As directed    Increase activity slowly   Complete by: As directed        Follow-up Information     Goins, Jonetta Nest, FNP. Go to.   Specialty: Family Medicine Why: please call to schedule follow hospital follow up for 7-14 days Contact information: [address] [place] [address] [phone]         [facility] [facility] Follow up.   Why: request has been sent for lightweight portable oxygen concentrator. office will call to arrange appt with Respiratory Therapist for evaluation Contact information: [address] [place] [address] [phone]         Edward Graff, [facility] Follow up.   Specialty: Home Health Services Why: local contact # [phone]  [phone] Home Health Physical Therapy and Home Health RN- agency will call to arrange visit Contact information: [address] [place] [address] [phone]                   Duration of Discharge Encounter: Greater than 35 minutes   Signed, Ruddy Corral, PA-C  09/25/2023, 9:57 AM

## 2023-09-19 NOTE — Progress Notes (Signed)
 LVAD Coordinator Rounding Note:  Admitted 09/13/23 from [facility] for acute respiratory distress. Pt reported increased bloating and SOB since Tuesday. CXR showed diffuse pulmonary edema on admission. Pt placed on Bipap and given IV Lasix  without success. Pt intubated in ED by CCM.  HM3 LVAD implanted on 04/20/17 by Dr Sherene Dilling under destination therapy criteria.  Pt lying in bed asleep. Speed increased to 5700 over the weekend. Diuresing with Lasix  drip 30 mg/hr.  Vital signs: Temp: 97.7 HR: 80-aflutter Doppler Pressure: 82 Automatic BP: 94/82 (88) O2 Sat: 96% on 2L/[place] Wt: 141.7>139.9 lbs  LVAD interrogation :  Speed: 5700 Flow: 4.8 Power: 4.4 w PI: 2.6   Alarms: none Events:  100+ for today Hematocrit: 46  Fixed speed: 5700 Low speed limit: 5400  Drive Line: CDI. Drive line anchor re-applied. Next dressing change 09/22/23 by bedside nurse.  Labs:  LDH trend: 373>211>338  INR trend: 1.7>1.7>2.2  Cr trend: 2.19>2.75>2.53  WBC trend: 13.9>6.8>16.1  Anticoagulation Plan: -INR Goal: 1.5-2.0 -ASA Dose: none - Coumadin : ongoing  Device: -Medtronic BiV -Therapies: ON  Arrythmias: Hx Afib/AF- on Amiodarone  200 mg daily- restarted 08/25/23 following ICD shock  Plan/Recommendations:  1. Page VAD coordinator with any drive line or equipment concerns 2. Weekly drive line dressing changes by bedside RN  Adams Adams RN,BSN VAD Coordinator  Office: [phone]  24/7 Pager: [phone]

## 2023-09-19 NOTE — Progress Notes (Signed)
 Patient ID: David [name], male   DOB: 1965/10/20, 58 y.o.   MRN: [id number]   Advanced Heart Failure VAD Team Note  PCP-Cardiologist: Jules Oar, MD   Chief Complaint: Acute Hypoxic Respiratory Failure   Patient Profile   58 y/o male with severe systolic HF due to mixed CM, VT s/p ICD, PAF, CKD 3 and COPD.   Several recent admits for respiratory distress, initially with flu A then with HF/post-viral syndrome.   At last admit concern for amio toxicity but ESR ok. Amio stopped but restarted 08/25/23 after ICD shock for VT.   Now readmitted w/ several days progressive/severe SOB. In ER in extremis. CXR with diffuse bilateral infiltrates. Intubated by CCM.  Subjective/Interval Hx:    Extubated on 5/24, now on 4L [place].  5/25 LVAD speed increased to 5700   Echo this admission with EF < 20% with interventricular septum slightly right-ward, moderate RV dysfunction/moderate RV enlargement. IVC dilated.   Diuresing with lasix  drip 30 mg per hour. I/O not accurate.   Denies SOB.   LVAD INTERROGATION:  HeartMate III LVAD:   Flow 4.6 liters/min, speed 5700, power 4 , PI 2.9  LDH 243>290 >338   Objective:    Vital Signs:   Temp:  [96.6 F (35.9 C)-97.8 F (36.6 C)] 96.6 F (35.9 C) (05/27 0450) Pulse Rate:  [78-107] 78 (05/26 2200) Resp:  [12-27] 14 (05/27 0600) BP: (84-105)/(59-85) 101/83 (05/27 0600) SpO2:  [88 %-97 %] 95 % (05/27 0803) Weight:  [63.5 kg] 63.5 kg (05/27 0618) Last BM Date :  (PTA) Mean arterial Pressure 80s  Intake/Output:   Intake/Output Summary (Last 24 hours) at 09/19/2023 0805 Last data filed at 09/19/2023 0717 Gross per 24 hour  Intake 1479.24 ml  Output 1525 ml  Net -45.76 ml   CVP 2  Physical Exam    Physical Exam: GENERAL: No acute distress. NECK: Supple, JVP flat  . CARDIAC:  Mechanical heart sounds with LVAD hum present.  LUNGS:  Clear to auscultation bilaterally. On 2 liters [place]  ABDOMEN:  Soft, round, nontender, positive bowel  sounds x4.     LVAD exit site:  Dressing dry and intact.  Stabilization device present and accurately applied.  EXTREMITIES:  Warm and dry, no cyanosis, clubbing, rash or edema  NEUROLOGIC:  Alert and oriented x 3.       Telemetry   A flutter 80s   EKG    N/A   Labs   Basic Metabolic Panel: Recent Labs  Lab 09/15/23 1212 09/15/23 1750 09/16/23 0435 09/16/23 1138 09/16/23 1637 09/17/23 0456 09/17/23 2015 09/18/23 0335 09/19/23 0455  NA 138 138 142   < > 139 136 133* 136 132*  K 5.1 4.1 3.6   < > 3.8 3.7 3.9 4.4 2.9*  CL 105 103 103  --  100 91* 86* 85* 78*  CO2 23 23 27   --  26 30 32 35* 36*  GLUCOSE 183* 153* 144*  --  132* 136* 125* 129* 103*  BUN 28* 34* 41*  --  51* 57* 61* 65* 79*  CREATININE 3.23* 2.94* 2.63*  --  2.55* 2.41* 2.50* 2.46* 2.53*  CALCIUM  8.8* 8.9 9.1  --  8.8* 9.1 8.9 9.6 9.3  MG 1.8 2.7* 2.3  --  2.1  --  1.8  --   --   PHOS 4.9* 3.6 3.6  --  5.0*  --   --   --   --    < > = values  in this interval not displayed.    Liver Function Tests: Recent Labs  Lab 09/14/23 1434  AST 36  ALT 21  ALKPHOS 120  BILITOT 1.9*  PROT 8.2*  ALBUMIN  2.9*   Recent Labs  Lab 09/14/23 1434  LIPASE 33   No results for input(s): "AMMONIA" in the last 168 hours.  CBC: Recent Labs  Lab 09/14/23 1434 09/14/23 1852 09/15/23 0450 09/16/23 0435 09/16/23 1138 09/17/23 0456 09/18/23 0335 09/19/23 0455  WBC 13.9*  --  6.8 9.0  --  13.8* 14.2* 16.1*  NEUTROABS 11.6*  --   --   --   --   --   --   --   HGB 12.9*   < > 11.6* 11.2* 12.9* 11.6* 13.1 15.1  HCT 41.7   < > 37.9* 35.8* 38.0* 36.2* 41.3 46.6  MCV 81.9  --  81.0 79.6*  --  78.2* 76.8* 76.1*  PLT 285  --  216 227  --  255 302 358   < > = values in this interval not displayed.    INR: Recent Labs  Lab 09/15/23 0450 09/16/23 0435 09/17/23 0456 09/18/23 0335 09/19/23 0455  INR 1.7* 2.8* 2.2* 2.0* 2.2*    Other results: EKG:    Imaging   DG Swallowing Func-Speech Pathology Result  Date: 09/18/2023 Table formatting from the original result was not included. Modified Barium Swallow Study Patient Details Name: RED MANDT MRN: [id number] Date of Birth: [birth year] Today's Date: 09/18/2023 HPI/PMH: HPI: This is a 58 year old male arriving for SOB. Last hospital stay April 2025 for acute HF, prob sepsis, also complicated by persistent hypoxia felt possibly mixed picture of PNA  (felt influenza A PNA w/ post viral PNA) vs amidarone toxicity. During this stay diuresed 15lbs fluid off. Speed of VAD increased and sent home on oxygen. 5/22 presented to ER w/ worsening SOB, NP dry cough, wt up 2 lbs. + orthopnea. Pulse ox 86% on 2 lpm RR 40s. Marked distress  SOB worse in ER. W/ progressive hypoxia. PCXR w/ diffuse pulmonary edema.   Placed on NIPPV, w/ no improvement so intubated 5/22-5/24. Pt had MBS last hospitalization on 4/19 - MBS WNL. w/ advanced systolic HF, CAD, HTn, CKD stage IIIA. He has been on HM III LVAD (which is his destination d/t CKD) since 2019. Clinical Impression: Clinical Impression: Pt demonstrates swallow function within normal limits. No timing or weakness appreciated. No penetration or aspiration. No dysphagia diagnosed and risk for prandial aspiration is low. Recommend pt continue current diet. Also note that there was an MBS on 08/12/23 that also showed normal swallowing. SLP will sign off. Factors that may increase risk of adverse event in presence of aspiration Roderick Civatte & Jessy Morocco 2021): No data recorded Recommendations/Plan: Swallowing Evaluation Recommendations Swallowing Evaluation Recommendations Recommendations: PO diet PO Diet Recommendation: Regular; Thin liquids (Level 0) Liquid Administration via: Cup; Straw Medication Administration: Whole meds with liquid Supervision: Patient able to self-feed Postural changes: Position pt fully upright for meals Oral care recommendations: Pt independent with oral care Treatment Plan Treatment Plan Treatment recommendations: No  treatment recommended at this time Follow-up recommendations: No SLP follow up Recommendations Recommendations for follow up therapy are one component of a multi-disciplinary discharge planning process, led by the attending physician.  Recommendations may be updated based on patient status, additional functional criteria and insurance authorization. Assessment: Orofacial Exam: Orofacial Exam Oral Cavity - Dentition: Edentulous; Dentures, not available Anatomy: Anatomy: WFL Boluses Administered: No data recorded Oral Impairment  Domain: Oral Impairment Domain Lip Closure: No labial escape Tongue control during bolus hold: Cohesive bolus between tongue to palatal seal Bolus preparation/mastication: Timely and efficient chewing and mashing Bolus transport/lingual motion: Brisk tongue motion Oral residue: Complete oral clearance Initiation of pharyngeal swallow : Posterior angle of the ramus  Pharyngeal Impairment Domain: Pharyngeal Impairment Domain Soft palate elevation: No bolus between soft palate (SP)/pharyngeal wall (PW) Laryngeal elevation: Complete superior movement of thyroid  cartilage with complete approximation of arytenoids to epiglottic petiole Anterior hyoid excursion: Complete anterior movement Epiglottic movement: Complete inversion Laryngeal vestibule closure: Complete, no air/contrast in laryngeal vestibule Pharyngeal stripping wave : Present - complete Pharyngoesophageal segment opening: Complete distension and complete duration, no obstruction of flow Tongue base retraction: No contrast between tongue base and posterior pharyngeal wall (PPW) Pharyngeal residue: Complete pharyngeal clearance  Esophageal Impairment Domain: No data recorded Pill: No data recorded Penetration/Aspiration Scale Score: Penetration/Aspiration Scale Score 1.  Material does not enter airway: Thin liquids (Level 0); Mildly thick liquids (Level 2, nectar thick); Moderately thick liquids (Level 3, honey thick); Puree; Solid; Pill  Compensatory Strategies: Compensatory Strategies Compensatory strategies: No   General Information: Caregiver present: No  Diet Prior to this Study: Regular; Thin liquids (Level 0)   Temperature : Normal   Respiratory Status: WFL   Supplemental O2: None (Room air)   History of Recent Intubation: Yes  Behavior/Cognition: Cooperative; Alert; Pleasant mood Self-Feeding Abilities: Able to self-feed Baseline vocal quality/speech: Normal Volitional Cough: Able to elicit Volitional Swallow: Able to elicit Exam Limitations: No limitations Goal Planning: No data recorded No data recorded No data recorded No data recorded No data recorded Pain: No data recorded End of Session: Start Time:SLP Start Time (ACUTE ONLY): 0858 Stop Time: SLP Stop Time (ACUTE ONLY): 0915 Time Calculation:SLP Time Calculation (min) (ACUTE ONLY): 17 min Charges: SLP Evaluations $ SLP Speech Visit: 1 Visit SLP Evaluations $MBS Swallow: 1 Procedure SLP visit diagnosis: SLP Visit Diagnosis: Dysphagia, unspecified (R13.10) Past Medical History: Past Medical History: Diagnosis Date  AICD (automatic cardioverter/defibrillator) present   Anemia   Anxiety   CAD (coronary artery disease) 2009  AMI in 12/2007 with PCI to LAD, staged PCI to  mid/distal RCA, NSTEMI in 02/2009 with BMS to LCx  CHF (congestive heart failure) (HCC)   Chronic kidney disease 11/03/2016  stage 3 kidney disease  Dysrhythmia   "arrythmia problems at some point", "fatal rhythms"  History of blood transfusion   "I was bleeding from was rectum" (04/19/2017)  HLD (hyperlipidemia)   HTN (hypertension)   Hyperlipidemia 03/15/2011  Ischemic cardiomyopathy   Admitted in 07/2010 with CHF exacerbation  Myocardial infarction (HCC)   "I've had 4" (04/19/2017)  Pneumonia 2018 X 1  Seizures (HCC)   one seizure in 04/2016 during cardiac event (04/19/2017)  STEMI 2009 (anterior), 2010 (lateral), and 2012 (inferior) 03/14/2011 Past Surgical History: Past Surgical History: Procedure Laterality Date  AORTIC  VALVE REPAIR N/A 04/20/2017  Procedure: AORTIC VALVE REPAIR;  Surgeon: Bartley Lightning, MD;  Location: [facility] OR;  Service: Open Heart Surgery;  Laterality: N/A;  BIV ICD INSERTION CRT-D N/A 08/01/2016  Procedure: BiV ICD Insertion CRT-D;  Surgeon: Will Cortland Ding, MD;  Location: [facility] INVASIVE CV LAB;  Service: Cardiovascular;  Laterality: N/A;  CARDIAC CATHETERIZATION N/A 05/05/2016  Procedure: Right/Left Heart Cath and Coronary Angiography;  Surgeon: Mardell Shade, MD;  Location: [facility] INVASIVE CV LAB;  Service: Cardiovascular;  Laterality: N/A;  CARDIAC CATHETERIZATION N/A 05/09/2016  Procedure: Coronary Stent Intervention;  Surgeon: Hubert Madden  [place], MD;  Location: [facility] INVASIVE CV LAB;  Service: Cardiovascular;  Laterality: N/A;  CARDIAC CATHETERIZATION N/A 05/09/2016  Procedure: Intravascular Pressure Wire/FFR Study;  Surgeon: Peter M [place], MD;  Location: [facility] INVASIVE CV LAB;  Service: Cardiovascular;  Laterality: N/A;  CARDIOVERSION N/A 10/17/2018  Procedure: CARDIOVERSION;  Surgeon: Mardell Shade, MD;  Location: [facility] ENDOSCOPY;  Service: Cardiovascular;  Laterality: N/A;  CORONARY ANGIOPLASTY WITH STENT PLACEMENT    "i've got a total of 12 stents" (04/19/2017)  ELECTROPHYSIOLOGY STUDY N/A 06/24/2016  Procedure: Electrophysiology Study;  Surgeon: Verona Goodwill, MD;  Location: [facility] INVASIVE CV LAB;  Service: Cardiovascular;  Laterality: N/A;  ENTEROSCOPY N/A 06/03/2017  Procedure: ENTEROSCOPY;  Surgeon: Ace Holder, MD;  Location: [facility] ENDOSCOPY;  Service: Gastroenterology;  Laterality: N/A;  EPICARDIAL PACING LEAD PLACEMENT Left 11/07/2016  Procedure: LV EPICARDIAL PACING LEAD PLACEMENT VIA LEFT MINI THORACOTOMY;  Surgeon: Bartley Lightning, MD;  Location: [facility] OR;  Service: Thoracic;  Laterality: Left;  ICD IMPLANT N/A 06/24/2016  Procedure: POSSIBLE  ICD Implant;  Surgeon: Verona Goodwill, MD;  Location: [facility] INVASIVE CV LAB;  Service: Cardiovascular;  Laterality: N/A;  INSERTION OF IMPLANTABLE LEFT  VENTRICULAR ASSIST DEVICE N/A 04/20/2017  Procedure: INSERTION OF IMPLANTABLE LEFT VENTRICULAR ASSIST DEVICE, Aortic Valve repair;  Surgeon: Bartley Lightning, MD;  Location: [facility] OR;  Service: Open Heart Surgery;  Laterality: N/A;  Heartmate 3  IR FLUORO GUIDE CV LINE RIGHT  05/08/2017  IR FLUORO GUIDE CV MIDLINE PICC RIGHT  03/14/2017  IR REMOVAL TUN CV CATH W/O FL  05/25/2017  IR US  GUIDE VASC ACCESS RIGHT  03/14/2017  IR US  GUIDE VASC ACCESS RIGHT  05/08/2017  LEAD EXTRACTION N/A 05/24/2023  Procedure: LEAD EXTRACTION;  Surgeon: Boyce Byes, MD;  Location: [facility] INVASIVE CV LAB;  Service: Cardiovascular;  Laterality: N/A;  LEFT HEART CATHETERIZATION WITH CORONARY ANGIOGRAM N/A 03/13/2011  Procedure: LEFT HEART CATHETERIZATION WITH CORONARY ANGIOGRAM;  Surgeon: Avanell Leigh, MD;  Location: [facility] CATH LAB;  Service: Cardiovascular;  Laterality: N/A;  MULTIPLE EXTRACTIONS WITH ALVEOLOPLASTY N/A 04/12/2017  Procedure: Extraction of tooth #'s 2, 5-12, 17, 20-29 with alveoloplasty amd maxillary right buccal exostoses reductions.;  Surgeon: Carol Chroman, DDS;  Location: [facility] OR;  Service: Oral Surgery;  Laterality: N/A;  RIGHT HEART CATH N/A 04/19/2017  Procedure: RIGHT HEART CATH;  Surgeon: Mardell Shade, MD;  Location: [facility] INVASIVE CV LAB;  Service: Cardiovascular;  Laterality: N/A;  RIGHT HEART CATH N/A 08/08/2023  Procedure: RIGHT HEART CATH;  Surgeon: Mardell Shade, MD;  Location: [facility] INVASIVE CV LAB;  Service: Cardiovascular;  Laterality: N/A;  RIGHT/LEFT HEART CATH AND CORONARY ANGIOGRAPHY N/A 03/10/2017  Procedure: RIGHT/LEFT HEART CATH AND CORONARY ANGIOGRAPHY;  Surgeon: Mardell Shade, MD;  Location: [facility] INVASIVE CV LAB;  Service: Cardiovascular;  Laterality: N/A;  TEE WITHOUT CARDIOVERSION N/A 04/20/2017  Procedure: TRANSESOPHAGEAL ECHOCARDIOGRAM (TEE);  Surgeon: Bartley Lightning, MD;  Location: [facility] OR;  Service: Open Heart Surgery;  Laterality: N/A;  TEE WITHOUT CARDIOVERSION N/A 10/17/2018   Procedure: TRANSESOPHAGEAL ECHOCARDIOGRAM (TEE);  Surgeon: Mardell Shade, MD;  Location: [facility] ENDOSCOPY;  Service: Cardiovascular;  Laterality: N/A;  TRANSESOPHAGEAL ECHOCARDIOGRAM (CATH LAB) N/A 05/24/2023  Procedure: TRANSESOPHAGEAL ECHOCARDIOGRAM;  Surgeon: Boyce Byes, MD;  Location: [facility] INVASIVE CV LAB;  Service: Cardiovascular;  Laterality: N/A; DeBlois, Hardin Leys 09/18/2023, 10:53 AM     Medications:     Scheduled Medications:  amiodarone   200 mg Oral Daily   amLODipine   5 mg Oral  Daily   atorvastatin   40 mg Oral Daily   Chlorhexidine  Gluconate Cloth  6 each Topical Daily   insulin  aspart  2-6 Units Subcutaneous Q4H   magnesium  oxide  400 mg Oral BID   multivitamin with minerals  1 tablet Oral Daily   pantoprazole   40 mg Oral Daily   potassium chloride  SA  20 mEq Oral Daily   revefenacin  175 mcg Nebulization Daily   tamsulosin   0.4 mg Oral Daily   tamsulosin   0.4 mg Oral Daily   thiamine  100 mg Oral Daily   torsemide   20 mg Oral Daily   traZODone   50 mg Oral QHS   Warfarin - Pharmacist Dosing Inpatient   Does not apply q1600    Infusions:  cefTRIAXone  (ROCEPHIN )  IV Stopped (09/17/23 1714)   doxycycline  (VIBRAMYCIN ) IV 125 mL/hr at 09/19/23 0717   furosemide  (LASIX ) 200 mg in dextrose  5 % 100 mL (2 mg/mL) infusion 30 mg/hr (09/19/23 0717)    PRN Medications: acetaminophen , fluticasone , ipratropium-albuterol , ondansetron  (ZOFRAN ) IV, mouth rinse    Assessment/Plan:    1. Acute Hypoxic Respiratory Failure: admitted earlier in the month with AHRF. ?PNA post viral infection. Newly requiring home O2. Presented this admission with respiratory distress despite bipap requiring intubation. Now extubated.  - Hi-Res chest CT 4/25: GG airspace disease in right lung, pulmonary edema, ATX left base, emphysema. Concern for amiodarone  lung toxicity but would also consider aspiration PNA.  - Respiratory panel negative  - Concern for amio toxicity last admission,  recently placed back on amiodarone  for ICD shock. Continued concern for amiodarone  lung toxicity this admission, though ESR not significantly high (31), CRP 3.9. - Continue off amiodarone . Reviewed CT with Dr. Felipe Horton, think problem is more likely volume overload than amiodarone  lung toxicity.  Solumedrol stopped.  - On empiric abx for possible PNA, ceftriaxone /doxy (PCT 0.94).  - CVP down 2-3. Stop lasix  drip. - Oxygen requirements down to 2 liters.    2. Chronic systolic CHF with HM III LVAD implant 04/20/17 - RHC 4/15: RA 6, PA mean 35, PCWP 19, Fick CI 3.1, TD CI 3.0. Speed 5600 - Concern for RV dysfunction given HF presentation - Echo, EF < 20% with interventricular septum slightly right-ward, moderate RV dysfunction/moderate RV enlargement. IVC dilated.  With rightward septum,  speed to 5700 rpm.  - CVP 2-3 . Stop lasix  drip.  Anticipate starting torsemide  40 mg daily tomorrow. - GDMT limited by renal fx/hyperkalemia   - MAP 80s stable.    3. VAD management - VAD interrogated personally. Parameters stable.  - INR 2.2 (goal 1.5-2). PharmD dosing warfarin.  - LDH 186 => 243=>290=>338  4. AKI on CKD stage 3: baseline SCr 1.5-1.8 - sCr 2.2 admission>> - suspect renal venous congestion, creatinine trending down with diuresis. - avoid hypotension - follow BMET    5. Hx of Ventricular tachycardia - Had ICD shock x 2 for VT on 11/24 (device reprogrammed at the time) - S/p fracture lead removal and ICD exchange 1/25 - ICD shock for VT 5/1, amio resumed - Stopped amiodarone  again with concern for lung toxicity - No VT    6. Persistent AFL - most recent ICD interrogation with 100% AF burden - Rate controlled. - on warfarin; now off amiodarone    7. CAD:  s/p PCI RCA/PLOM with DES x2 and DES to mild LAD 1/18 - No chest pain.  - Continue statin. Off ASA due to GIB/AVMs   8. Urinary retention:  - Continue flomax .  -  Bladder scan as needed.   9. Hyperkalemia - Resolved.   10.  Hypokalemia Replace K. BMET 1200   Remove central line.  Transfer to 2 C . Ambulate.    I reviewed the LVAD parameters from today, and compared the results to the patient's prior recorded data.  No programming changes were made.  The LVAD is functioning within specified parameters.  The patient performs LVAD self-test daily.  LVAD interrogation was negative for any significant power changes, alarms or PI events/speed drops.  LVAD equipment check completed and is in good working order.  Back-up equipment present.   LVAD education done on emergency procedures and precautions and reviewed exit site care.  CRITICAL CARE Performed by: Nieves Bars  Total critical care time: \15 minutes  Critical care time was exclusive of separately billable procedures and treating other patients.  Critical care was necessary to treat or prevent imminent or life-threatening deterioration.  Critical care was time spent personally by me on the following activities: development of treatment plan with patient and/or surrogate as well as nursing, discussions with consultants, evaluation of patient's response to treatment, examination of patient, obtaining history from patient or surrogate, ordering and performing treatments and interventions, ordering and review of laboratory studies, ordering and review of radiographic studies, pulse oximetry and re-evaluation of patient's condition.   Length of Stay: 5  Kortlyn Koltz, NP 09/19/2023, 8:05 AM  VAD Team --- VAD ISSUES ONLY--- Pager [phone] (7am - 7am)  Advanced Heart Failure Team  Pager [phone] (M-F; 7a - 5p)  Please contact [facility] for night-coverage after hours (5p -7a ) and weekends on [email or web address]

## 2023-09-19 NOTE — TOC Initial Note (Signed)
 Transition of Care [facility]) - Initial/Assessment Note    Patient Details  Name: David [name] MRN: [id number] Date of Birth: March 03, 1966  [facility]) CM/SW Contact:    Benjiman Bras, RN Phone Number: [phone] 09/19/2023, 6:09 PM  Clinical Narrative:                 TOC CM spoke to pt at bedside. Pt had [facility] in past. Was set up with oxygen at last admission from [facility]. Pt lives at home with wife and gave permission to speak to wife. Has scale at home for daily weights. Has Living Better with HF at home.   Will continue to follow for dc needs.   Expected Discharge Plan: Home/Self Care Barriers to Discharge: Continued Medical Work up   Patient Goals and CMS Choice Patient states their goals for this hospitalization and ongoing recovery are:: wants to recover CMS Medicare.gov Compare Post Acute Care list provided to:: Patient Choice offered to / list presented to : Patient      Expected Discharge Plan and Services   Discharge Planning Services: CM Consult Post Acute Care Choice: Home Health                                        Prior Living Arrangements/Services   Lives with:: Spouse Patient language and need for interpreter reviewed:: Yes Do you feel safe going back to the place where you live?: Yes      Need for Family Participation in Patient Care: No (Comment) Care giver support system in place?: Yes (comment) Current home services: DME (rolling walker) Criminal Activity/Legal Involvement Pertinent to Current Situation/Hospitalization: No - Comment as needed  Activities of Daily Living   ADL Screening (condition at time of admission) Independently performs ADLs?: Yes (appropriate for developmental age) Is the patient deaf or have difficulty hearing?: No Does the patient have difficulty seeing, even when wearing glasses/contacts?: No Does the patient have difficulty concentrating, remembering, or making decisions?:  No  Permission Sought/Granted Permission sought to share information with : Case Manager, Family Supports, PCP Permission granted to share information with : Yes, Verbal Permission Granted  Share Information with NAME: Candon Caras  Permission granted to share info w AGENCY: Home Health, DME  Permission granted to share info w Relationship: wife  Permission granted to share info w Contact Information: [phone]  Emotional Assessment Appearance:: Appears stated age Attitude/Demeanor/Rapport: Engaged Affect (typically observed): Accepting Orientation: : Oriented to Self, Oriented to Place, Oriented to Situation, Oriented to  Time   Psych Involvement: No (comment)  Admission diagnosis:  Acute respiratory failure with hypoxia (HCC) [J96.01] Heart failure (HCC) [I50.9] Patient Active Problem List   Diagnosis Date Noted   Malnutrition of moderate degree 09/15/2023   Heart failure (HCC) 09/14/2023   Acute respiratory failure with hypoxia (HCC) 09/14/2023   Pneumonia 08/03/2023   SOB (shortness of breath) 07/10/2023   Malfunction of implantable defibrillator ventricular (ICD) lead 05/24/2023   ICD (implantable cardioverter-defibrillator) lead failure 05/24/2023   Failure of implantable cardioverter-defibrillator (ICD) lead 05/24/2023   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 08/30/2022   SAH (subarachnoid hemorrhage) (HCC)    LVAD (left ventricular assist device) present (HCC)    ICH (intracerebral hemorrhage) (HCC) 06/05/2021   Left ventricular assist device present (HCC) 06/08/2017   Gastric AVM    Presence of left ventricular assist device (LVAD) (  HCC) 04/20/2017   Palliative care encounter    Ischemic cardiomyopathy 11/07/2016   CKD (chronic kidney disease), stage III (HCC) 05/13/2016   Acute hypoxic respiratory failure (HCC) 05/13/2016   PAF (paroxysmal atrial fibrillation) (HCC)    Acute on chronic combined systolic and diastolic CHF (congestive heart  failure) (HCC) 04/30/2016   Acute on chronic systolic heart failure (HCC) 03/15/2011   PCP:  Mina Alter, FNP Pharmacy:   [facility] [address] [place] - [address] [place] [address] Phone: [phone] Fax: [phone]  [facility] [address] [place] [address] Phone: [phone] Fax: [phone]     Social Drivers of Health (SDOH) Social History: SDOH Screenings   Food Insecurity: No Food Insecurity (09/16/2023)  Housing: Low Risk  (09/16/2023)  Transportation Needs: No Transportation Needs (09/16/2023)  Utilities: Not At Risk (09/16/2023)  Depression (PHQ2-9): High Risk (07/19/2021)  Financial Resource Strain: High Risk (07/04/2022)  Physical Activity: Inactive (03/10/2017)  Stress: No Stress Concern Present (03/10/2017)  Tobacco Use: Medium Risk (09/14/2023)   SDOH Interventions:     Readmission Risk Interventions    08/11/2023    1:58 PM  Readmission Risk Prevention Plan  Transportation Screening Complete  PCP or Specialist Appt within 5-7 Days Complete  Home Care Screening Complete  Medication Review (RN CM) Complete

## 2023-09-19 NOTE — Progress Notes (Signed)
 PHARMACY - ANTICOAGULATION CONSULT NOTE  Pharmacy Consult for warfarin Indication: LVAD  Allergies  Allergen Reactions   Plavix [Clopidogrel Bisulfate] Hives    Patient Measurements: Height: 5\' 5"  (165.1 cm) Weight: 63.5 kg (139 lb 15.9 oz) IBW/kg (Calculated) : 61.5  Vital Signs: Temp: 96.6 F (35.9 C) (05/27 0450) Temp Source: Axillary (05/27 0450) BP: 101/83 (05/27 0600) Pulse Rate: 78 (05/26 2200)  Labs: Recent Labs    09/17/23 0456 09/17/23 2015 09/18/23 0335 09/19/23 0455  HGB 11.6*  --  13.1 15.1  HCT 36.2*  --  41.3 46.6  PLT 255  --  302 358  LABPROT 24.5*  --  23.3* 24.9*  INR 2.2*  --  2.0* 2.2*  CREATININE 2.41* 2.50* 2.46* 2.53*    Estimated Creatinine Clearance: 27.3 mL/min (A) (by C-G formula based on SCr of 2.53 mg/dL (H)).   Medical History: Past Medical History:  Diagnosis Date   AICD (automatic cardioverter/defibrillator) present    Anemia    Anxiety    CAD (coronary artery disease) 2009   AMI in 12/2007 with PCI to LAD, staged PCI to  mid/distal RCA, NSTEMI in 02/2009 with BMS to LCx   CHF (congestive heart failure) (HCC)    Chronic kidney disease 11/03/2016   stage 3 kidney disease   Dysrhythmia    "arrythmia problems at some point", "fatal rhythms"   History of blood transfusion    "I was bleeding from was rectum" (04/19/2017)   HLD (hyperlipidemia)    HTN (hypertension)    Hyperlipidemia 03/15/2011   Ischemic cardiomyopathy    Admitted in 07/2010 with CHF exacerbation   Myocardial infarction (HCC)    "I've had 4" (04/19/2017)   Pneumonia 2018 X 1   Seizures (HCC)    one seizure in 04/2016 during cardiac event (04/19/2017)   STEMI 2009 (anterior), 2010 (lateral), and 2012 (inferior) 03/14/2011     Assessment: 57 yoM with HM3 LVAD on warfarin PTA admitted with hypoxia requiring intubation. Pharmacy to manage warfarin. INR goal 1.5-2. Home dose is 1mg  daily.  INR is slight elevated at 2.2 - last dose of warfarin on 5/23. Hgb  15.1, plt 358, LDH 338. No s/sx of bleeding.   Goal of Therapy:  INR 1.5-2 Monitor platelets by anticoagulation protocol: Yes   Plan:  Hold warfarin again tonight. Low threshold to re-dose once INR <2.0 Monitor INR, CBC, LHD, and s/sx of bleeding.  Thank you for allowing pharmacy to participate in this patient's care,  Nieves Bars, PharmD, BCCCP Clinical Pharmacist  Phone: [phone] 09/19/2023 8:13 AM  Please check AMION for all [facility] Pharmacy phone numbers After 10:00 PM, call Main Pharmacy [phone]

## 2023-09-20 ENCOUNTER — Inpatient Hospital Stay (HOSPITAL_COMMUNITY)

## 2023-09-20 ENCOUNTER — Other Ambulatory Visit: Payer: Self-pay

## 2023-09-20 DIAGNOSIS — I5022 Chronic systolic (congestive) heart failure: Secondary | ICD-10-CM

## 2023-09-20 DIAGNOSIS — I5023 Acute on chronic systolic (congestive) heart failure: Secondary | ICD-10-CM | POA: Diagnosis not present

## 2023-09-20 LAB — PROTIME-INR
INR: 2.1 — ABNORMAL HIGH (ref 0.8–1.2)
Prothrombin Time: 23.3 s — ABNORMAL HIGH (ref 11.4–15.2)

## 2023-09-20 LAB — GLUCOSE, CAPILLARY
Glucose-Capillary: 113 mg/dL — ABNORMAL HIGH (ref 70–99)
Glucose-Capillary: 207 mg/dL — ABNORMAL HIGH (ref 70–99)
Glucose-Capillary: 72 mg/dL (ref 70–99)
Glucose-Capillary: 128 mg/dL — ABNORMAL HIGH (ref 70–99)
Glucose-Capillary: 120 mg/dL — ABNORMAL HIGH (ref 70–99)

## 2023-09-20 LAB — BASIC METABOLIC PANEL WITH GFR
Anion gap: 17 — ABNORMAL HIGH (ref 5–15)
BUN: 82 mg/dL — ABNORMAL HIGH (ref 6–20)
CO2: 32 mmol/L (ref 22–32)
Calcium: 9 mg/dL (ref 8.9–10.3)
Chloride: 82 mmol/L — ABNORMAL LOW (ref 98–111)
Creatinine, Ser: 2.8 mg/dL — ABNORMAL HIGH (ref 0.61–1.24)
GFR, Estimated: 25 mL/min — ABNORMAL LOW (ref >60)
Glucose, Bld: 100 mg/dL — ABNORMAL HIGH (ref 70–99)
Potassium: 3.5 mmol/L (ref 3.5–5.1)
Sodium: 131 mmol/L — ABNORMAL LOW (ref 135–145)

## 2023-09-20 LAB — ECHOCARDIOGRAM LIMITED
Est EF: <20
Height: 65 in
Weight: 1922.41 [oz_av]

## 2023-09-20 LAB — LACTATE DEHYDROGENASE: LDH: 270 U/L — ABNORMAL HIGH (ref 98–192)

## 2023-09-20 MED ORDER — POTASSIUM CHLORIDE CRYS ER 20 MEQ PO TBCR
20.0000 meq | EXTENDED_RELEASE_TABLET | Freq: Once | ORAL | Status: AC
  Administered 2023-09-20: 20 meq via ORAL
  Filled 2023-09-20: qty 1

## 2023-09-20 MED ORDER — POLYETHYLENE GLYCOL 3350 17 G PO PACK
17.0000 g | PACK | Freq: Every day | ORAL | Status: DC
  Administered 2023-09-20: 17 g via ORAL
  Filled 2023-09-20 (×4): qty 1

## 2023-09-20 MED ORDER — LACTATED RINGERS IV BOLUS
250.0000 mL | Freq: Once | INTRAVENOUS | Status: AC
  Administered 2023-09-20: 250 mL via INTRAVENOUS

## 2023-09-20 MED ORDER — SENNOSIDES-DOCUSATE SODIUM 8.6-50 MG PO TABS
1.0000 | ORAL_TABLET | Freq: Two times a day (BID) | ORAL | Status: DC
  Administered 2023-09-20: 1 via ORAL
  Filled 2023-09-20 (×7): qty 1

## 2023-09-20 NOTE — Progress Notes (Signed)
*  PRELIMINARY RESULTS* Echocardiogram 2D Echocardiogram has been performed.  David [name] 09/20/2023, 12:40 PM

## 2023-09-20 NOTE — Plan of Care (Signed)
  Problem: Education: Goal: Patient will understand all VAD equipment and how it functions Outcome: Progressing   Problem: Cardiac: Goal: LVAD will function as expected and patient will experience no clinical alarms Outcome: Progressing   Problem: Activity: Goal: Ability to tolerate increased activity will improve Outcome: Progressing   Problem: Respiratory: Goal: Ability to maintain a clear airway and adequate ventilation will improve Outcome: Progressing   Problem: Role Relationship: Goal: Method of communication will improve Outcome: Progressing   Problem: Education: Goal: Knowledge of General Education information will improve Description: Including pain rating scale, medication(s)/side effects and non-pharmacologic comfort measures Outcome: Progressing   Problem: Health Behavior/Discharge Planning: Goal: Ability to manage health-related needs will improve Outcome: Progressing   Problem: Clinical Measurements: Goal: Ability to maintain clinical measurements within normal limits will improve Outcome: Progressing Goal: Will remain free from infection Outcome: Progressing Goal: Diagnostic test results will improve Outcome: Progressing Goal: Respiratory complications will improve Outcome: Progressing   Problem: Activity: Goal: Risk for activity intolerance will decrease Outcome: Progressing   Problem: Nutrition: Goal: Adequate nutrition will be maintained Outcome: Progressing   Problem: Coping: Goal: Level of anxiety will decrease Outcome: Progressing   Problem: Elimination: Goal: Will not experience complications related to urinary retention Outcome: Progressing   Problem: Pain Managment: Goal: General experience of comfort will improve and/or be controlled Outcome: Progressing   Problem: Safety: Goal: Ability to remain free from injury will improve Outcome: Progressing   Problem: Skin Integrity: Goal: Risk for impaired skin integrity will  decrease Outcome: Progressing

## 2023-09-20 NOTE — Plan of Care (Signed)
  Problem: Activity: Goal: Ability to tolerate increased activity will improve Outcome: Progressing   Problem: Activity: Goal: Risk for activity intolerance will decrease Outcome: Progressing   Problem: Nutrition: Goal: Adequate nutrition will be maintained Outcome: Progressing   Problem: Coping: Goal: Level of anxiety will decrease Outcome: Progressing   Problem: Elimination: Goal: Will not experience complications related to bowel motility Outcome: Progressing Goal: Will not experience complications related to urinary retention Outcome: Progressing   Problem: Pain Managment: Goal: General experience of comfort will improve and/or be controlled Outcome: Progressing

## 2023-09-20 NOTE — Progress Notes (Signed)
 Attempted to call report for patient to 2C at 1420 and 1445. [name]

## 2023-09-20 NOTE — Progress Notes (Signed)
 PHARMACY - ANTICOAGULATION CONSULT NOTE  Pharmacy Consult for warfarin Indication: LVAD  Allergies  Allergen Reactions   Plavix [Clopidogrel Bisulfate] Hives    Patient Measurements: Height: 5\' 5"  (165.1 cm) Weight: 54.5 kg (120 lb 2.4 oz) IBW/kg (Calculated) : 61.5  Vital Signs: Temp: 98.7 F (37.1 C) (05/28 0400) Temp Source: Oral (05/28 0400) BP: 81/54 (05/28 0600) Pulse Rate: 97 (05/28 0835)  Labs: Recent Labs    09/18/23 0335 09/19/23 0455 09/19/23 1433 09/20/23 0331  HGB 13.1 15.1  --   --   HCT 41.3 46.6  --   --   PLT 302 358  --   --   LABPROT 23.3* 24.9*  --  23.3*  INR 2.0* 2.2*  --  2.1*  CREATININE 2.46* 2.53* 2.92* 2.80*    Estimated Creatinine Clearance: 21.9 mL/min (A) (by C-G formula based on SCr of 2.8 mg/dL (H)).   Medical History: Past Medical History:  Diagnosis Date   AICD (automatic cardioverter/defibrillator) present    Anemia    Anxiety    CAD (coronary artery disease) 2009   AMI in 12/2007 with PCI to LAD, staged PCI to  mid/distal RCA, NSTEMI in 02/2009 with BMS to LCx   CHF (congestive heart failure) (HCC)    Chronic kidney disease 11/03/2016   stage 3 kidney disease   Dysrhythmia    "arrythmia problems at some point", "fatal rhythms"   History of blood transfusion    "I was bleeding from was rectum" (04/19/2017)   HLD (hyperlipidemia)    HTN (hypertension)    Hyperlipidemia 03/15/2011   Ischemic cardiomyopathy    Admitted in 07/2010 with CHF exacerbation   Myocardial infarction (HCC)    "I've had 4" (04/19/2017)   Pneumonia 2018 X 1   Seizures (HCC)    one seizure in 04/2016 during cardiac event (04/19/2017)   STEMI 2009 (anterior), 2010 (lateral), and 2012 (inferior) 03/14/2011     Assessment: 57 yoM with HM3 LVAD on warfarin PTA admitted with hypoxia requiring intubation. Pharmacy to manage warfarin. INR goal 1.5-2. Home dose is 1mg  daily.  INR is still slightly elevated at 2.1 - last dose of warfarin on 5/23. Hgb  15.1, plt 358 (last check 5/27). LDH trending down to 270. No s/sx of bleeding. Oral intake documented at 75% with two meals yesterday.   Goal of Therapy:  INR 1.5-2 Monitor platelets by anticoagulation protocol: Yes   Plan:  Hold warfarin again tonight. Low threshold to re-dose once INR <2.0 Monitor INR, CBC, LHD, and s/sx of bleeding.  Thank you for allowing pharmacy to participate in this patient's care,  Nieves Bars, PharmD, BCCCP Clinical Pharmacist  Phone: [phone] 09/20/2023 9:10 AM  Please check AMION for all [facility] Pharmacy phone numbers After 10:00 PM, call Main Pharmacy [phone]

## 2023-09-20 NOTE — Progress Notes (Addendum)
 Patient ID: David [name], male   DOB: [birth year], 58 y.o.   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   Several recent admits for respiratory distress, initially with flu A then with HF/post-viral syndrome.   At last admit concern for amio toxicity but ESR ok. Amio stopped but restarted 08/25/23 after ICD shock for VT.   Now readmitted w/ several days progressive/severe SOB. In ER in extremis. CXR with diffuse bilateral infiltrates. Intubated by CCM.  Subjective/Interval Hx:    Extubated on 5/24, now on 4L [place].  5/25 LVAD speed increased to 5700  5/27 Diuretics held  Denies SOB.    LVAD INTERROGATION:  HeartMate III LVAD:   Flow 4.1 liters/min, speed 5700, power 4 , PI 1.6 . Multiple PI events with speed drop.   LDH 243>290 >338   Objective:    Vital Signs:   Temp:  [97.8 F (36.6 C)-98.7 F (37.1 C)] 98.7 F (37.1 C) (05/28 0400) Pulse Rate:  [29-111] 97 (05/28 0835) Resp:  [13-35] 15 (05/28 0835) BP: (81-97)/(54-81) 81/54 (05/28 0600) SpO2:  [90 %-96 %] 94 % (05/28 0835) Weight:  [54.5 kg] 54.5 kg (05/28 0500) Last BM Date :  (PTA) Mean arterial Pressure  Map 60s  Doppler 80.   Intake/Output:   Intake/Output Summary (Last 24 hours) at 09/20/2023 0902 Last data filed at 09/20/2023 0600 Gross per 24 hour  Intake 723.67 ml  Output 1600 ml  Net -876.33 ml    Physical Exam  Physical Exam: GENERAL: No acute distress.In the chair NECK: Supple, JVP does not appear elevated.  CARDIAC:  Mechanical heart sounds with LVAD hum present.  LUNGS:  Decreased in the bases. On 2 liters.  ABDOMEN:  Soft, round, nontender, positive bowel sounds x4.     LVAD exit site:  Dressing dry and intact.  No erythema or drainage.  Stabilization device present and accurately  applied.  EXTREMITIES:  Warm and dry, MAE x4  NEUROLOGIC:  Alert and oriented x 3.         Telemetry   A flutter 80s   EKG    N/A   Labs   Basic Metabolic Panel: Recent Labs  Lab 09/15/23 1212 09/15/23 1750 09/16/23 0435 09/16/23 1138 09/16/23 1637 09/17/23 0456 09/17/23 2015 09/18/23 0335 09/19/23 0455 09/19/23 1433 09/20/23 0331  NA 138 138 142   < > 139   < > 133* 136 132* 133* 131*  K 5.1 4.1 3.6   < > 3.8   < > 3.9 4.4 2.9* 3.3* 3.5  CL 105 103 103  --  100   < > 86* 85* 78* 81* 82*  CO2 23 23 27   --  26   < > 32 35* 36* 35* 32  GLUCOSE 183* 153* 144*  --  132*   < > 125* 129* 103* 120* 100*  BUN 28* 34* 41*  --  51*   < > 61* 65* 79* 85* 82*  CREATININE 3.23* 2.94* 2.63*  --  2.55*   < > 2.50* 2.46* 2.53* 2.92* 2.80*  CALCIUM  8.8* 8.9 9.1  --  8.8*   < > 8.9 9.6 9.3 9.0 9.0  MG 1.8 2.7* 2.3  --  2.1  --  1.8  --   --  2.8*  --   PHOS 4.9* 3.6 3.6  --  5.0*  --   --   --   --   --   --    < > = values in this interval not displayed.    Liver Function Tests: Recent Labs  Lab 09/14/23 1434  AST 36  ALT 21  ALKPHOS 120  BILITOT 1.9*  PROT 8.2*  ALBUMIN  2.9*   Recent Labs  Lab 09/14/23 1434  LIPASE 33   No results for input(s): "AMMONIA" in the last 168 hours.  CBC: Recent Labs  Lab 09/14/23 1434 09/14/23 1852 09/15/23 0450 09/16/23 0435 09/16/23 1138 09/17/23 0456 09/18/23 0335 09/19/23 0455  WBC 13.9*  --  6.8 9.0  --  13.8* 14.2* 16.1*  NEUTROABS 11.6*  --   --   --   --   --   --   --   HGB 12.9*   < > 11.6* 11.2* 12.9* 11.6* 13.1 15.1  HCT 41.7   < > 37.9* 35.8* 38.0* 36.2* 41.3 46.6  MCV 81.9  --  81.0 79.6*  --  78.2* 76.8* 76.1*  PLT 285  --  216 227  --  255 302 358   < > = values in this interval not displayed.    INR: Recent Labs  Lab 09/16/23 0435 09/17/23 0456 09/18/23 0335 09/19/23 0455 09/20/23 0331  INR 2.8* 2.2* 2.0* 2.2* 2.1*    Other results: EKG:    Imaging   DG Swallowing Func-Speech  Pathology Result Date: 09/18/2023 Table formatting from the original result was not included. Modified Barium Swallow Study Patient Details Name: CHARES [name] MRN: [id number] Date of Birth: Jun 22, 1965 Today's Date: 09/18/2023 HPI/PMH: HPI: This is a 58 year old male arriving for SOB. Last hospital stay April 2025 for acute HF, prob sepsis, also complicated by persistent hypoxia felt possibly mixed picture of PNA  (felt influenza A PNA w/ post viral PNA) vs amidarone toxicity. During this stay diuresed 15lbs fluid off. Speed of VAD increased and sent home on oxygen. 5/22 presented to ER w/ worsening SOB, NP dry cough, wt up 2 lbs. + orthopnea. Pulse ox 86% on 2 lpm RR 40s. Marked distress  SOB worse in ER. W/ progressive hypoxia. PCXR w/ diffuse pulmonary edema.   Placed on NIPPV, w/ no improvement so intubated 5/22-5/24. Pt had MBS last hospitalization on 4/19 - MBS WNL. w/ advanced systolic HF, CAD, HTn, CKD stage IIIA. He has been on HM III LVAD (which is his destination d/t CKD) since 2019. Clinical Impression: Clinical Impression: Pt demonstrates swallow function within normal limits. No timing or weakness appreciated. No penetration or aspiration. No dysphagia diagnosed and risk for prandial aspiration is low. Recommend pt continue current diet. Also note that there was an MBS on 08/12/23 that also showed normal swallowing. SLP will sign off. Factors that may increase risk of adverse event in presence of aspiration Roderick Civatte & Jessy Morocco 2021): No data recorded Recommendations/Plan: Swallowing Evaluation Recommendations Swallowing Evaluation Recommendations Recommendations: PO diet PO Diet Recommendation: Regular; Thin liquids (Level 0) Liquid Administration via: Cup; Straw Medication Administration: Whole meds with liquid Supervision: Patient able to self-feed Postural changes: Position pt fully upright for meals Oral care recommendations: Pt independent with oral care Treatment Plan Treatment Plan Treatment  recommendations: No treatment recommended at this time Follow-up recommendations: No SLP follow up Recommendations Recommendations for follow up therapy are one component of a multi-disciplinary discharge planning process, led by the attending physician.  Recommendations may be updated based on patient status, additional functional criteria and insurance authorization. Assessment: Orofacial Exam: Orofacial Exam Oral Cavity - Dentition: Edentulous; Dentures, not available Anatomy: Anatomy: WFL Boluses Administered: No data recorded Oral Impairment Domain: Oral Impairment Domain Lip Closure: No labial escape Tongue control during bolus hold: Cohesive bolus between tongue to palatal seal Bolus preparation/mastication: Timely and efficient chewing and mashing Bolus transport/lingual motion: Brisk tongue motion Oral residue: Complete oral clearance Initiation of pharyngeal swallow : Posterior angle of the ramus  Pharyngeal Impairment Domain: Pharyngeal Impairment Domain Soft palate elevation: No bolus between soft palate (SP)/pharyngeal wall (PW) Laryngeal elevation: Complete superior movement of thyroid  cartilage with complete approximation of arytenoids to epiglottic petiole Anterior hyoid excursion: Complete anterior movement Epiglottic movement: Complete inversion Laryngeal vestibule closure: Complete, no air/contrast in laryngeal vestibule Pharyngeal stripping wave : Present - complete Pharyngoesophageal segment opening: Complete distension and complete duration, no obstruction of flow Tongue base retraction: No contrast between tongue base and posterior pharyngeal wall (PPW) Pharyngeal residue: Complete pharyngeal clearance  Esophageal Impairment Domain: No data recorded Pill: No data recorded Penetration/Aspiration Scale Score: Penetration/Aspiration Scale Score 1.  Material does not enter airway: Thin liquids (Level 0); Mildly thick liquids (Level 2, nectar thick); Moderately thick liquids (Level 3, honey thick);  Puree; Solid; Pill Compensatory Strategies: Compensatory Strategies Compensatory strategies: No   General Information: Caregiver present: No  Diet Prior to this Study: Regular; Thin liquids (Level 0)   Temperature : Normal   Respiratory Status: WFL   Supplemental O2: None (Room air)   History of Recent Intubation: Yes  Behavior/Cognition: Cooperative; Alert; Pleasant mood Self-Feeding Abilities: Able to self-feed Baseline vocal quality/speech: Normal Volitional Cough: Able to elicit Volitional Swallow: Able to elicit Exam Limitations: No limitations Goal Planning: No data recorded No data recorded No data recorded No data recorded No data recorded Pain: No data recorded End of Session: Start Time:SLP Start Time (ACUTE ONLY): 0858 Stop Time: SLP Stop Time (ACUTE ONLY): 0915 Time Calculation:SLP Time Calculation (min) (ACUTE ONLY): 17 min Charges: SLP Evaluations $ SLP Speech Visit: 1 Visit SLP Evaluations $MBS Swallow: 1 Procedure SLP visit diagnosis: SLP Visit Diagnosis: Dysphagia, unspecified (R13.10) Past Medical History: Past Medical History: Diagnosis Date  AICD (automatic cardioverter/defibrillator) present   Anemia   Anxiety   CAD (coronary artery disease) 2009  AMI in 12/2007 with PCI to LAD, staged PCI to  mid/distal RCA, NSTEMI in 02/2009 with BMS to LCx  CHF (congestive heart failure) (HCC)   Chronic kidney disease 11/03/2016  stage 3 kidney disease  Dysrhythmia   "arrythmia problems at some point", "fatal rhythms"  History of blood transfusion   "I was bleeding from was rectum" (04/19/2017)  HLD (hyperlipidemia)   HTN (hypertension)   Hyperlipidemia 03/15/2011  Ischemic cardiomyopathy   Admitted in 07/2010 with CHF exacerbation  Myocardial infarction (HCC)   "I've had 4" (04/19/2017)  Pneumonia 2018 X 1  Seizures (HCC)   one seizure in 04/2016 during cardiac event (04/19/2017)  STEMI 2009 (anterior), 2010 (lateral), and 2012 (inferior) 03/14/2011 Past Surgical History: Past Surgical History: Procedure  Laterality Date  AORTIC VALVE REPAIR N/A 04/20/2017  Procedure: AORTIC VALVE REPAIR;  Surgeon: Bartley Lightning, MD;  Location: [facility] OR;  Service: Open Heart Surgery;  Laterality: N/A;  BIV ICD INSERTION CRT-D N/A 08/01/2016  Procedure: BiV ICD Insertion CRT-D;  Surgeon: Will Cortland Ding, MD;  Location: [facility] INVASIVE CV LAB;  Service: Cardiovascular;  Laterality: N/A;  CARDIAC CATHETERIZATION N/A 05/05/2016  Procedure: Right/Left  Heart Cath and Coronary Angiography;  Surgeon: Mardell Shade, MD;  Location: [facility] INVASIVE CV LAB;  Service: Cardiovascular;  Laterality: N/A;  CARDIAC CATHETERIZATION N/A 05/09/2016  Procedure: Coronary Stent Intervention;  Surgeon: Peter M Swaziland, MD;  Location: [facility] INVASIVE CV LAB;  Service: Cardiovascular;  Laterality: N/A;  CARDIAC CATHETERIZATION N/A 05/09/2016  Procedure: Intravascular Pressure Wire/FFR Study;  Surgeon: Peter M Swaziland, MD;  Location: [facility] INVASIVE CV LAB;  Service: Cardiovascular;  Laterality: N/A;  CARDIOVERSION N/A 10/17/2018  Procedure: CARDIOVERSION;  Surgeon: Mardell Shade, MD;  Location: [facility] ENDOSCOPY;  Service: Cardiovascular;  Laterality: N/A;  CORONARY ANGIOPLASTY WITH STENT PLACEMENT    "i've got a total of 12 stents" (04/19/2017)  ELECTROPHYSIOLOGY STUDY N/A 06/24/2016  Procedure: Electrophysiology Study;  Surgeon: Verona Goodwill, MD;  Location: [facility] INVASIVE CV LAB;  Service: Cardiovascular;  Laterality: N/A;  ENTEROSCOPY N/A 06/03/2017  Procedure: ENTEROSCOPY;  Surgeon: Ace Holder, MD;  Location: [facility] ENDOSCOPY;  Service: Gastroenterology;  Laterality: N/A;  EPICARDIAL PACING LEAD PLACEMENT Left 11/07/2016  Procedure: LV EPICARDIAL PACING LEAD PLACEMENT VIA LEFT MINI THORACOTOMY;  Surgeon: Bartley Lightning, MD;  Location: [facility] OR;  Service: Thoracic;  Laterality: Left;  ICD IMPLANT N/A 06/24/2016  Procedure: POSSIBLE  ICD Implant;  Surgeon: Verona Goodwill, MD;  Location: [facility] INVASIVE CV LAB;  Service: Cardiovascular;  Laterality: N/A;  INSERTION OF  IMPLANTABLE LEFT VENTRICULAR ASSIST DEVICE N/A 04/20/2017  Procedure: INSERTION OF IMPLANTABLE LEFT VENTRICULAR ASSIST DEVICE, Aortic Valve repair;  Surgeon: Bartley Lightning, MD;  Location: [facility] OR;  Service: Open Heart Surgery;  Laterality: N/A;  Heartmate 3  IR FLUORO GUIDE CV LINE RIGHT  05/08/2017  IR FLUORO GUIDE CV MIDLINE PICC RIGHT  03/14/2017  IR REMOVAL TUN CV CATH W/O FL  05/25/2017  IR US  GUIDE VASC ACCESS RIGHT  03/14/2017  IR US  GUIDE VASC ACCESS RIGHT  05/08/2017  LEAD EXTRACTION N/A 05/24/2023  Procedure: LEAD EXTRACTION;  Surgeon: Boyce Byes, MD;  Location: [facility] INVASIVE CV LAB;  Service: Cardiovascular;  Laterality: N/A;  LEFT HEART CATHETERIZATION WITH CORONARY ANGIOGRAM N/A 03/13/2011  Procedure: LEFT HEART CATHETERIZATION WITH CORONARY ANGIOGRAM;  Surgeon: Avanell Leigh, MD;  Location: [facility] CATH LAB;  Service: Cardiovascular;  Laterality: N/A;  MULTIPLE EXTRACTIONS WITH ALVEOLOPLASTY N/A 04/12/2017  Procedure: Extraction of tooth #'s 2, 5-12, 17, 20-29 with alveoloplasty amd maxillary right buccal exostoses reductions.;  Surgeon: Carol Chroman, DDS;  Location: [facility] OR;  Service: Oral Surgery;  Laterality: N/A;  RIGHT HEART CATH N/A 04/19/2017  Procedure: RIGHT HEART CATH;  Surgeon: Mardell Shade, MD;  Location: [facility] INVASIVE CV LAB;  Service: Cardiovascular;  Laterality: N/A;  RIGHT HEART CATH N/A 08/08/2023  Procedure: RIGHT HEART CATH;  Surgeon: Mardell Shade, MD;  Location: [facility] INVASIVE CV LAB;  Service: Cardiovascular;  Laterality: N/A;  RIGHT/LEFT HEART CATH AND CORONARY ANGIOGRAPHY N/A 03/10/2017  Procedure: RIGHT/LEFT HEART CATH AND CORONARY ANGIOGRAPHY;  Surgeon: Mardell Shade, MD;  Location: [facility] INVASIVE CV LAB;  Service: Cardiovascular;  Laterality: N/A;  TEE WITHOUT CARDIOVERSION N/A 04/20/2017  Procedure: TRANSESOPHAGEAL ECHOCARDIOGRAM (TEE);  Surgeon: Bartley Lightning, MD;  Location: [facility] OR;  Service: Open Heart Surgery;  Laterality: N/A;  TEE WITHOUT  CARDIOVERSION N/A 10/17/2018  Procedure: TRANSESOPHAGEAL ECHOCARDIOGRAM (TEE);  Surgeon: Mardell Shade, MD;  Location: [facility] ENDOSCOPY;  Service: Cardiovascular;  Laterality: N/A;  TRANSESOPHAGEAL ECHOCARDIOGRAM (CATH LAB) N/A 05/24/2023  Procedure: TRANSESOPHAGEAL ECHOCARDIOGRAM;  Surgeon: Boyce Byes, MD;  Location: [facility] INVASIVE CV LAB;  Service: Cardiovascular;  Laterality: N/A; DeBlois, Hardin Leys 09/18/2023, 10:53 AM     Medications:     Scheduled Medications:  amiodarone   200 mg Oral Daily   amLODipine   5 mg Oral Daily   atorvastatin   40 mg Oral Daily   Chlorhexidine  Gluconate Cloth  6 each Topical Daily   insulin  aspart  2-6 Units Subcutaneous Q4H   magnesium  oxide  400 mg Oral BID   multivitamin with minerals  1 tablet Oral Daily   pantoprazole   40 mg Oral Daily   revefenacin  175 mcg Nebulization Daily   tamsulosin   0.4 mg Oral Daily   thiamine  100 mg Oral Daily   traZODone   50 mg Oral QHS   Warfarin - Pharmacist Dosing Inpatient   Does not apply q1600    Infusions:    PRN Medications: acetaminophen , fluticasone , ipratropium-albuterol , ondansetron  (ZOFRAN ) IV, mouth rinse    Assessment/Plan:    1. Acute Hypoxic Respiratory Failure: admitted earlier in the month with AHRF. ?PNA post viral infection. Newly requiring home O2. Presented this admission with respiratory distress despite bipap requiring intubation. Now extubated.  - Hi-Res chest CT 4/25: GG airspace disease in right lung, pulmonary edema, ATX left base, emphysema. Concern for amiodarone  lung toxicity but would also consider aspiration PNA.  - Respiratory panel negative  - Concern for amio toxicity last admission, recently placed back on amiodarone  for ICD shock. Continued concern for amiodarone  lung toxicity this admission, though ESR not significantly high (31), CRP 3.9. - Continue off amiodarone . Reviewed CT with Dr. Felipe Horton, think problem is more likely volume overload than amiodarone  lung  toxicity.  Solumedrol stopped.  - Completed  ceftriaxone /doxy (PCT 0.94).  - Oxygen requirements down to 2 liters. - Continue incentive spirometer.   2. Chronic systolic CHF with HM III LVAD implant 04/20/17 - RHC 4/15: RA 6, PA mean 35, PCWP 19, Fick CI 3.1, TD CI 3.0. Speed 5600 - Concern for RV dysfunction given HF presentation - Echo, EF < 20% with interventricular septum slightly right-ward, moderate RV dysfunction/moderate RV enlargement. IVC dilated.  With rightward septum,  speed to 5700 rpm.  Mulitple PI events today with speed drops. Will get Echo. May need to cut speed back.  -.Maps low. Hold amlodipine .  - GDMT limited by renal fx/hyperkalemia   -Appears euvolemic. Hold diuretics.    3. VAD management - VAD interrogated personally. Parameters stable.  - INR 2.1 (goal 1.5-2). PharmD dosing warfarin.  - LDH  trending back down 338>270   4. AKI on CKD stage 3: baseline SCr 1.5-1.8 - sCr 2.2 admission>>2.8 today. Hold diuretics.  - suspect renal venous congestion, creatinine trending down with diuresis. - avoid hypotension - follow BMET    5. Hx of Ventricular tachycardia - Had ICD shock x 2 for VT on 11/24 (device reprogrammed at the time) - S/p fracture lead removal and ICD exchange 1/25 - ICD shock for VT 5/1, amio resumed - Stopped amiodarone  again with concern for lung toxicity - No VT    6. Persistent AFL - most recent ICD interrogation with 100% AF burden - Rate controlled.  - on warfarin; now off amiodarone    7. CAD:  s/p PCI RCA/PLOM with DES x2 and DES to mild LAD 1/18 - No chest pain.  - Continue statin. Off ASA due to GIB/AVMs   8. Urinary retention:  - Continue flomax .  - Bladder scan as needed.   9. Hyperkalemia - Resolved.   10. Hypokalemia Supp K  Ambulate. Check ECHO today. May need to drop speed.  Repeat CXR today.    I reviewed the LVAD parameters from today, and compared the results to the patient's prior recorded data.  No  programming changes were made.  The LVAD is functioning within specified parameters.  The patient performs LVAD self-test daily.  LVAD interrogation was negative for any significant power changes, alarms or PI events/speed drops.  LVAD equipment check completed and is in good working order.  Back-up equipment present.   LVAD education done on emergency procedures and precautions and reviewed exit site care.  CRITICAL CARE Performed by: Nieves Bars  Total critical care time: \15 minutes  Critical care time was exclusive of separately billable procedures and treating other patients.  Critical care was necessary to treat or prevent imminent or life-threatening deterioration.  Critical care was time spent personally by me on the following activities: development of treatment plan with patient and/or surrogate as well as nursing, discussions with consultants, evaluation of patient's response to treatment, examination of patient, obtaining history from patient or surrogate, ordering and performing treatments and interventions, ordering and review of laboratory studies, ordering and review of radiographic studies, pulse oximetry and re-evaluation of patient's condition.   Length of Stay: 6  Nieves Bars, NP 09/20/2023, 9:02 AM  VAD Team --- VAD ISSUES ONLY--- Pager [phone] (7am - 7am)  Advanced Heart Failure Team  Pager [phone] (M-F; 7a - 5p)  Please contact [facility] for night-coverage after hours (5p -7a ) and weekends on [email or web address]

## 2023-09-20 NOTE — Progress Notes (Signed)
 LVAD Coordinator Rounding Note:  Admitted 09/13/23 from [facility] for acute respiratory distress. Pt reported increased bloating and SOB since Tuesday. CXR showed diffuse pulmonary edema on admission. Pt placed on Bipap and given IV Lasix  without success. Pt intubated in ED by CCM.  HM3 LVAD implanted on 04/20/17 by Dr Sherene Dilling under destination therapy criteria.  Pt lying in bed asleep. Speed increased to 5700 over the weekend. Echo done this morning and speed dropped back to 5600. Pt has only had 2 PIs since speed drop.   Vital signs: Temp: 98.2 HR: 99-aflutter Doppler Pressure: 80 Automatic BP: 89/80 (85) O2 Sat: 94% on 2L/[place] Wt: 141.7>139.9>120.1 lbs  LVAD interrogation :  Speed: 5600 Flow: 4.3 Power: 4.2 w PI: 1.4   Alarms: none Events:  2 since speed drop this morning, 100+ yesterday Hematocrit: 46  Fixed speed: 5600 Low speed limit: 5300  Drive Line: CDI. Drive line anchor re-applied. Next dressing change 09/22/23 by bedside nurse.  Labs:  LDH trend: 373>211>338>270  INR trend: 1.7>1.7>2.2>2.1  Cr trend: 2.19>2.75>2.53>2.8  WBC trend: 13.9>6.8>16.1  Anticoagulation Plan: -INR Goal: 1.5-2.0 -ASA Dose: none - Coumadin : ongoing  Device: -Medtronic BiV -Therapies: ON  Arrythmias: Hx Afib/AF- on Amiodarone  200 mg daily- restarted 08/25/23 following ICD shock  Plan/Recommendations:  1. Page VAD coordinator with any drive line or equipment concerns 2. Weekly drive line dressing changes by bedside RN  Adams Adams RN,BSN VAD Coordinator  Office: [phone]  24/7 Pager: [phone]

## 2023-09-21 ENCOUNTER — Inpatient Hospital Stay (HOSPITAL_COMMUNITY)

## 2023-09-21 DIAGNOSIS — Z95811 Presence of heart assist device: Secondary | ICD-10-CM | POA: Diagnosis not present

## 2023-09-21 DIAGNOSIS — I5043 Acute on chronic combined systolic (congestive) and diastolic (congestive) heart failure: Secondary | ICD-10-CM | POA: Diagnosis not present

## 2023-09-21 LAB — PROTIME-INR
INR: 1.7 — ABNORMAL HIGH (ref 0.8–1.2)
Prothrombin Time: 19.9 s — ABNORMAL HIGH (ref 11.4–15.2)

## 2023-09-21 LAB — GLUCOSE, CAPILLARY
Glucose-Capillary: 103 mg/dL — ABNORMAL HIGH (ref 70–99)
Glucose-Capillary: 126 mg/dL — ABNORMAL HIGH (ref 70–99)
Glucose-Capillary: 127 mg/dL — ABNORMAL HIGH (ref 70–99)
Glucose-Capillary: 150 mg/dL — ABNORMAL HIGH (ref 70–99)
Glucose-Capillary: 160 mg/dL — ABNORMAL HIGH (ref 70–99)
Glucose-Capillary: 223 mg/dL — ABNORMAL HIGH (ref 70–99)
Glucose-Capillary: 318 mg/dL — ABNORMAL HIGH (ref 70–99)

## 2023-09-21 LAB — BASIC METABOLIC PANEL WITH GFR
Anion gap: 13 (ref 5–15)
BUN: 74 mg/dL — ABNORMAL HIGH (ref 6–20)
CO2: 31 mmol/L (ref 22–32)
Calcium: 9.2 mg/dL (ref 8.9–10.3)
Chloride: 89 mmol/L — ABNORMAL LOW (ref 98–111)
Creatinine, Ser: 2.48 mg/dL — ABNORMAL HIGH (ref 0.61–1.24)
GFR, Estimated: 29 mL/min — ABNORMAL LOW (ref >60)
Glucose, Bld: 78 mg/dL (ref 70–99)
Potassium: 3.2 mmol/L — ABNORMAL LOW (ref 3.5–5.1)
Sodium: 133 mmol/L — ABNORMAL LOW (ref 135–145)

## 2023-09-21 LAB — LACTATE DEHYDROGENASE: LDH: 237 U/L — ABNORMAL HIGH (ref 98–192)

## 2023-09-21 MED ORDER — INSULIN ASPART 100 UNIT/ML IJ SOLN
5.0000 [IU] | Freq: Once | INTRAMUSCULAR | Status: AC
  Administered 2023-09-21: 5 [IU] via SUBCUTANEOUS

## 2023-09-21 MED ORDER — WARFARIN SODIUM 1 MG PO TABS
1.0000 mg | ORAL_TABLET | Freq: Once | ORAL | Status: AC
  Administered 2023-09-21: 1 mg via ORAL
  Filled 2023-09-21: qty 1

## 2023-09-21 MED ORDER — POTASSIUM CHLORIDE CRYS ER 20 MEQ PO TBCR: 40.0000 meq | EXTENDED_RELEASE_TABLET | Freq: Once | ORAL | Status: DC

## 2023-09-21 MED ORDER — LACTATED RINGERS IV BOLUS
250.0000 mL | Freq: Once | INTRAVENOUS | Status: AC
  Administered 2023-09-21: 250 mL via INTRAVENOUS

## 2023-09-21 MED ORDER — INSULIN ASPART 100 UNIT/ML IJ SOLN: 0.0000 [IU] | Freq: Every day | INTRAMUSCULAR | Status: DC

## 2023-09-21 MED ORDER — INSULIN ASPART 100 UNIT/ML IJ SOLN
0.0000 [IU] | Freq: Three times a day (TID) | INTRAMUSCULAR | Status: DC
  Administered 2023-09-21 (×2): 2 [IU] via SUBCUTANEOUS
  Administered 2023-09-22: 3 [IU] via SUBCUTANEOUS
  Administered 2023-09-22: 2 [IU] via SUBCUTANEOUS

## 2023-09-21 MED ORDER — POTASSIUM CHLORIDE CRYS ER 20 MEQ PO TBCR
40.0000 meq | EXTENDED_RELEASE_TABLET | ORAL | Status: AC
  Administered 2023-09-21 (×2): 40 meq via ORAL
  Filled 2023-09-21 (×2): qty 2

## 2023-09-21 NOTE — Progress Notes (Signed)
 Patient ID: David [name], male   DOB: 10/30/1965, 58 y.o.   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   Several recent admits for respiratory distress, initially with flu A then with HF/post-viral syndrome.   At last admit concern for amio toxicity but ESR ok. Amio stopped but restarted 08/25/23 after ICD shock for VT.   Readmitted w/ several days progressive/severe SOB. In ER in extremis. CXR with diffuse bilateral infiltrates. Intubated by CCM.  Subjective/Interval Hx:    Extubated on 5/24, now on [place] 5/25 LVAD speed increased to 5700  5/28 LVAD speed decreased to 5600 d/t worsening RV failure   Sleeping but easily aroused. No complaints this am.   INR 1.7.   LVAD INTERROGATION:  HeartMate III LVAD:   Flow 4.4 liters/min, speed 5600, power 4.3 , PI 4.0 . 84 PI events so far this am with multiple speed drops.    Objective:    Vital Signs:   Temp:  [97.6 F (36.4 C)-98.4 F (36.9 C)] 97.6 F (36.4 C) (05/29 0302) Pulse Rate:  [30-111] 62 (05/29 0004) Resp:  [13-26] 17 (05/29 0302) BP: (67-97)/(55-84) 89/77 (05/29 0302) SpO2:  [92 %-99 %] 96 % (05/29 0302) Weight:  [64.4 kg] 64.4 kg (05/29 0430) Last BM Date : 09/20/23 Mean arterial Pressure  Map 60s  Doppler 80.   Intake/Output:   Intake/Output Summary (Last 24 hours) at 09/21/2023 0651 Last data filed at 09/21/2023 0300 Gross per 24 hour  Intake 970.14 ml  Output 950 ml  Net 20.14 ml    Physical Exam  Physical Exam: GENERAL: Lying in bed. No distress. NECK: No JVD CARDIAC:  Mechanical heart sounds with LVAD hum present.  LUNGS:  Clear to auscultation bilaterally.  ABDOMEN:  Soft, round, nontender, positive bowel sounds x4.     LVAD exit site:   Dressing dry and intact.  Stabilization device  present and accurately applied.  EXTREMITIES:  No edema NEUROLOGIC:  Alert and oriented x 4.  Affect pleasant.      Telemetry   AFL 70s  EKG    N/A   Labs   Basic Metabolic Panel: Recent Labs  Lab 09/15/23 1212 09/15/23 1750 09/16/23 0435 09/16/23 1138 09/16/23 1637 09/17/23 0456 09/17/23 2015 09/18/23 0335 09/19/23 0455 09/19/23 1433 09/20/23 0331 09/21/23 0258  NA 138 138 142   < > 139   < > 133* 136 132* 133* 131* 133*  K 5.1 4.1 3.6   < > 3.8   < > 3.9 4.4 2.9* 3.3* 3.5 3.2*  CL 105 103 103  --  100   < > 86* 85* 78* 81* 82* 89*  CO2 23 23 27   --  26   < > 32 35* 36* 35* 32 31  GLUCOSE 183* 153* 144*  --  132*   < > 125* 129* 103* 120* 100* 78  BUN 28* 34* 41*  --  51*   < > 61* 65* 79* 85* 82* 74*  CREATININE 3.23* 2.94* 2.63*  --  2.55*   < > 2.50* 2.46* 2.53* 2.92* 2.80* 2.48*  CALCIUM  8.8* 8.9 9.1  --  8.8*   < > 8.9 9.6 9.3 9.0 9.0 9.2  MG 1.8 2.7* 2.3  --  2.1  --  1.8  --   --  2.8*  --   --   PHOS 4.9* 3.6 3.6  --  5.0*  --   --   --   --   --   --   --    < > = values in this interval not displayed.    Liver Function Tests: Recent Labs  Lab 09/14/23 1434  AST 36  ALT 21  ALKPHOS 120  BILITOT 1.9*  PROT 8.2*  ALBUMIN  2.9*   Recent Labs  Lab 09/14/23 1434  LIPASE 33   No results for input(s): "AMMONIA" in the last 168 hours.  CBC: Recent Labs  Lab 09/14/23 1434 09/14/23 1852 09/15/23 0450 09/16/23 0435 09/16/23 1138 09/17/23 0456 09/18/23 0335 09/19/23 0455  WBC 13.9*  --  6.8 9.0  --  13.8* 14.2* 16.1*  NEUTROABS 11.6*  --   --   --   --   --   --   --   HGB 12.9*   < > 11.6* 11.2* 12.9* 11.6* 13.1 15.1  HCT 41.7   < > 37.9* 35.8* 38.0* 36.2* 41.3 46.6  MCV 81.9  --  81.0 79.6*  --  78.2* 76.8* 76.1*  PLT 285  --  216 227  --  255 302 358   < > = values in this interval not displayed.    INR: Recent Labs  Lab 09/17/23 0456 09/18/23 0335 09/19/23 0455 09/20/23 0331 09/21/23 0258  INR 2.2* 2.0* 2.2* 2.1* 1.7*     Other results: EKG:    Imaging   DG CHEST PORT 1 VIEW Result Date: 09/20/2023 CLINICAL DATA:  Acute on chronic failure EXAM: PORTABLE CHEST 1 VIEW COMPARISON:  09/15/2023, 09/14/2023 FINDINGS: Removal of endotracheal and enteric tubes. Removal of right-sided central venous catheter. Sternotomy and left-sided pacing device. LVAD similar in position. Cardiomegaly with aortic atherosclerosis. Decreased vascular congestion compared to prior. Mild reticular and ground-glass opacity at the bases. Suspected small left effusion with mild left basilar opacity, favor atelectasis. IMPRESSION: 1. Removal of endotracheal and enteric tubes. Removal of right-sided central venous catheter. 2. Cardiomegaly with decreased vascular congestion. Mild reticular and ground-glass opacity at the bases, this may be due to minimal residual edema. Suspected small left effusion with mild left basilar opacity, favor atelectasis. Electronically Signed   By: Esmeralda Hedge M.D.   On: 09/20/2023 17:40   ECHOCARDIOGRAM LIMITED Result Date: 09/20/2023    ECHOCARDIOGRAM LIMITED REPORT   Patient Name:   David [name] Date of Exam: 09/20/2023 Medical Rec #:  [id number]        Height:       65.0 in Accession #:    [id number]       Weight:       120.1 lb Date of Birth:  [birth year]        BSA:          1.593 m Patient Age:    58 years         BP:           97/84 mmHg Patient Gender: M                HR:           75 bpm. Exam Location:  Inpatient Procedure: 2D Echo and Limited Color Doppler (Both Spectral and Color Flow            Doppler were utilized during procedure). Indications:    Limited for heart failure  History:  Patient has prior history of Echocardiogram examinations, most                 recent 09/15/2023.  Sonographer:    Andrena Bang Referring Phys: [phone] AMY D CLEGG  Sonographer Comments: Ramp study IMPRESSIONS  1. LVAD in the left ventricular apex. LVAD speed initially 5700 RPM. Reduced to 5600 RPM at the end of the  study with improvement in RV function. Left ventricular ejection fraction, by estimation, is <20%. The left ventricle has severely decreased function. There is the interventricular septum is flattened in systole and diastole, consistent with right ventricular pressure and volume overload.  2. Right ventricular systolic function is severely reduced. The right ventricular size is severely enlarged. There is normal pulmonary artery systolic pressure.  3. The mitral valve is normal in structure. No evidence of mitral valve regurgitation. No evidence of mitral stenosis.  4. The aortic valve is tricuspid. FINDINGS  Left Ventricle: LVAD in the left ventricular apex. LVAD speed initially 5700 RPM. Reduced to 5600 RPM at the end of the study with improvement in RV function. Left ventricular ejection fraction, by estimation, is <20%. The left ventricle has severely decreased function. The interventricular septum is flattened in systole and diastole, consistent with right ventricular pressure and volume overload. Right Ventricle: The right ventricular size is severely enlarged. Right ventricular systolic function is severely reduced. There is normal pulmonary artery systolic pressure. The tricuspid regurgitant velocity is 2.47 m/s, and with an assumed right atrial pressure of 3 mmHg, the estimated right ventricular systolic pressure is 27.4 mmHg. Mitral Valve: The mitral valve is normal in structure. No evidence of mitral valve stenosis. Tricuspid Valve: Tricuspid valve regurgitation is trivial. Aortic Valve: The aortic valve is tricuspid. Additional Comments: A device lead is visualized.  TRICUSPID VALVE TR Peak grad:   24.4 mmHg TR Vmax:        247.00 cm/s Maudine Sos MD Electronically signed by Maudine Sos MD Signature Date/Time: 09/20/2023/3:29:33 PM    Final       Medications:     Scheduled Medications:  amiodarone   200 mg Oral Daily   atorvastatin   40 mg Oral Daily   Chlorhexidine  Gluconate Cloth  6  each Topical Daily   insulin  aspart  2-6 Units Subcutaneous Q4H   magnesium  oxide  400 mg Oral BID   multivitamin with minerals  1 tablet Oral Daily   pantoprazole   40 mg Oral Daily   polyethylene glycol  17 g Oral Daily   revefenacin  175 mcg Nebulization Daily   senna-docusate  1 tablet Oral BID   tamsulosin   0.4 mg Oral Daily   thiamine  100 mg Oral Daily   traZODone   50 mg Oral QHS   Warfarin - Pharmacist Dosing Inpatient   Does not apply q1600    Infusions:    PRN Medications: acetaminophen , fluticasone , ipratropium-albuterol , ondansetron  (ZOFRAN ) IV, mouth rinse    Assessment/Plan:    1. Acute Hypoxic Respiratory Failure: admitted earlier in the month with AHRF. ?PNA post viral infection. Newly requiring home O2. Presented this admission with respiratory distress despite bipap requiring intubation. Now extubated and stable on supplemental O2 [place].  - Hi-Res chest CT 4/25: GG airspace disease in right lung, pulmonary edema, ATX left base, emphysema. Initial concern for amiodarone  lung toxicity but would also consider aspiration PNA.  - Concern for amio toxicity last admission, recently placed back on amiodarone  for ICD shock. Continued concern for amiodarone  lung toxicity this admission, though ESR not significantly  high (31), CRP 3.9. Continue off amiodarone . Reviewed CT with Dr. Felipe Horton, think problem is more likely volume overload than amiodarone  lung toxicity.  Solumedrol stopped.  - Respiratory panel negative Completed ceftriaxone /doxy (PCT 0.94).  - Continue to wean O2 as able. - Continue incentive spirometer.   2. Chronic systolic CHF with HM III LVAD implant 04/20/17 - RHC 4/15: RA 6, PA mean 35, PCWP 19, Fick CI 3.1, TD CI 3.0. Speed 5600 - Concern for RV dysfunction given HF presentation - Echo, EF < 20% with interventricular septum slightly right-ward, moderate RV dysfunction/moderate RV enlargement. IVC dilated.  With rightward septum,  speed to 5700 rpm.  - Limited  echo 05/28 with worsening RV failure and interventricular septum bowing into LV. RV appeared slightly better after decreasing speed to 5600 rpm - MAPs better with holding amlodipine  but still with frequent PI events and speed drops. Will review with Dr. Bruce Caper to see if needs repeat echo/possible adjustment in LVAD speed again today - Continue holding diuretics. - GDMT limited by renal fx/hyperkalemia     3. VAD management - VAD interrogated personally. Parameters stable.  - INR 1.7 (goal 1.5-2). PharmD dosing warfarin.  - LDH  trending back down 338>270>237  4. AKI on CKD stage 3: baseline SCr 1.5-1.8 - sCr slightly better today, 2.8>2.48 after holding diuretics.  - follow BMET    5. Hx of Ventricular tachycardia - Had ICD shock x 2 for VT on 11/24 (device reprogrammed at the time) - S/p fracture lead removal and ICD exchange 1/25 - ICD shock for VT 5/1, amio resumed - Stopped amiodarone  again this admit with concern for lung toxicity - No VT on tele   6. Persistent AFL - most recent ICD interrogation with 100% AF burden - Rate controlled.  - on warfarin; now off amiodarone    7. CAD:  s/p PCI RCA/PLOM with DES x2 and DES to mild LAD 1/18 - No chest pain.  - Continue statin. Off ASA due to GIB/AVMs   8. Urinary retention:  - Continue flomax .  - Bladder scan as needed.   9. Hypokalemia - K 3.2 - Supp today    I reviewed the LVAD parameters from today, and compared the results to the patient's prior recorded data.  No programming changes were made.  The LVAD is functioning within specified parameters.  The patient performs LVAD self-test daily.  LVAD interrogation was negative for any significant power changes, alarms or PI events/speed drops.  LVAD equipment check completed and is in good working order.  Back-up equipment present.   LVAD education done on emergency procedures and precautions and reviewed exit site care.   Length of Stay: 7  Clarkson Rosselli, Angelena Barber,  PA-C 09/21/2023, 6:51 AM  VAD Team --- VAD ISSUES ONLY--- Pager [phone] (7am - 7am)  Advanced Heart Failure Team  Pager [phone] (M-F; 7a - 5p)  Please contact [facility] for night-coverage after hours (5p -7a ) and weekends on [email or web address]

## 2023-09-21 NOTE — Plan of Care (Signed)
  Problem: Education: Goal: Patient will understand all VAD equipment and how it functions Outcome: Progressing   Problem: Cardiac: Goal: LVAD will function as expected and patient will experience no clinical alarms Outcome: Progressing   Problem: Activity: Goal: Ability to tolerate increased activity will improve Outcome: Progressing   Problem: Education: Goal: Knowledge of General Education information will improve Description: Including pain rating scale, medication(s)/side effects and non-pharmacologic comfort measures Outcome: Progressing   Problem: Clinical Measurements: Goal: Cardiovascular complication will be avoided Outcome: Progressing   Problem: Activity: Goal: Risk for activity intolerance will decrease Outcome: Progressing

## 2023-09-21 NOTE — Plan of Care (Signed)
  Problem: Education: Goal: Patient will understand all VAD equipment and how it functions Outcome: Progressing Goal: Patient will be able to verbalize current INR target range and antiplatelet therapy for discharge home Outcome: Progressing   Problem: Cardiac: Goal: LVAD will function as expected and patient will experience no clinical alarms Outcome: Progressing   Problem: Activity: Goal: Ability to tolerate increased activity will improve Outcome: Progressing   Problem: Respiratory: Goal: Ability to maintain a clear airway and adequate ventilation will improve Outcome: Progressing   Problem: Role Relationship: Goal: Method of communication will improve Outcome: Progressing   Problem: Education: Goal: Knowledge of General Education information will improve Description: Including pain rating scale, medication(s)/side effects and non-pharmacologic comfort measures Outcome: Progressing   Problem: Health Behavior/Discharge Planning: Goal: Ability to manage health-related needs will improve Outcome: Progressing   Problem: Clinical Measurements: Goal: Ability to maintain clinical measurements within normal limits will improve Outcome: Progressing Goal: Will remain free from infection Outcome: Progressing Goal: Diagnostic test results will improve Outcome: Progressing Goal: Respiratory complications will improve Outcome: Progressing Goal: Cardiovascular complication will be avoided Outcome: Progressing   Problem: Activity: Goal: Risk for activity intolerance will decrease Outcome: Progressing   Problem: Nutrition: Goal: Adequate nutrition will be maintained Outcome: Progressing   Problem: Coping: Goal: Level of anxiety will decrease Outcome: Progressing   Problem: Elimination: Goal: Will not experience complications related to bowel motility Outcome: Progressing Goal: Will not experience complications related to urinary retention Outcome: Progressing   Problem:  Pain Managment: Goal: General experience of comfort will improve and/or be controlled Outcome: Progressing   Problem: Safety: Goal: Ability to remain free from injury will improve Outcome: Progressing   Problem: Skin Integrity: Goal: Risk for impaired skin integrity will decrease Outcome: Progressing   Problem: Education: Goal: Ability to describe self-care measures that may prevent or decrease complications (Diabetes Survival Skills Education) will improve Outcome: Progressing Goal: Individualized Educational Video(s) Outcome: Progressing   Problem: Coping: Goal: Ability to adjust to condition or change in health will improve Outcome: Progressing   Problem: Fluid Volume: Goal: Ability to maintain a balanced intake and output will improve Outcome: Progressing   Problem: Health Behavior/Discharge Planning: Goal: Ability to identify and utilize available resources and services will improve Outcome: Progressing Goal: Ability to manage health-related needs will improve Outcome: Progressing   Problem: Metabolic: Goal: Ability to maintain appropriate glucose levels will improve Outcome: Progressing   Problem: Nutritional: Goal: Maintenance of adequate nutrition will improve Outcome: Progressing Goal: Progress toward achieving an optimal weight will improve Outcome: Progressing   Problem: Skin Integrity: Goal: Risk for impaired skin integrity will decrease Outcome: Progressing   Problem: Tissue Perfusion: Goal: Adequacy of tissue perfusion will improve Outcome: Progressing

## 2023-09-21 NOTE — Progress Notes (Signed)
 PHARMACY - ANTICOAGULATION CONSULT NOTE  Pharmacy Consult for warfarin Indication: LVAD  Allergies  Allergen Reactions   Plavix [Clopidogrel Bisulfate] Hives    Patient Measurements: Height: 5\' 5"  (165.1 cm) Weight: 64.4 kg (141 lb 15.6 oz) IBW/kg (Calculated) : 61.5  Vital Signs: Temp: 97.7 F (36.5 C) (05/29 1109) Temp Source: Oral (05/29 1109) BP: 97/85 (05/29 1109) Pulse Rate: 66 (05/29 0755)  Labs: Recent Labs    09/19/23 0455 09/19/23 1433 09/20/23 0331 09/21/23 0258  HGB 15.1  --   --   --   HCT 46.6  --   --   --   PLT 358  --   --   --   LABPROT 24.9*  --  23.3* 19.9*  INR 2.2*  --  2.1* 1.7*  CREATININE 2.53* 2.92* 2.80* 2.48*    Estimated Creatinine Clearance: 27.9 mL/min (A) (by C-G formula based on SCr of 2.48 mg/dL (H)).   Medical History: Past Medical History:  Diagnosis Date   AICD (automatic cardioverter/defibrillator) present    Anemia    Anxiety    CAD (coronary artery disease) 2009   AMI in 12/2007 with PCI to LAD, staged PCI to  mid/distal RCA, NSTEMI in 02/2009 with BMS to LCx   CHF (congestive heart failure) (HCC)    Chronic kidney disease 11/03/2016   stage 3 kidney disease   Dysrhythmia    "arrythmia problems at some point", "fatal rhythms"   History of blood transfusion    "I was bleeding from was rectum" (04/19/2017)   HLD (hyperlipidemia)    HTN (hypertension)    Hyperlipidemia 03/15/2011   Ischemic cardiomyopathy    Admitted in 07/2010 with CHF exacerbation   Myocardial infarction (HCC)    "I've had 4" (04/19/2017)   Pneumonia 2018 X 1   Seizures (HCC)    one seizure in 04/2016 during cardiac event (04/19/2017)   STEMI 2009 (anterior), 2010 (lateral), and 2012 (inferior) 03/14/2011     Assessment: 57 yoM with HM3 LVAD on warfarin PTA admitted with hypoxia requiring intubation. Pharmacy to manage warfarin. INR goal 1.5-2. Home dose is 1mg  daily.  5/29 - INR down to 1.7 today.  Last dose of warfarin on 5/23. Hgb 15.1,  plt 358 (last check 5/27).  LDH continues to trend down to 237.  No s/sx of bleeding.  Good appetite, ate 80% of breakfast and wife brought in [employer] today for lunch.  Goal of Therapy:  INR 1.5-2 Monitor platelets by anticoagulation protocol: Yes   Plan:  Warfarin 1 mg PO x 1  Monitor INR, CBC, LHD, and s/sx of bleeding.  Thank you for allowing pharmacy to participate in this patient's care,  Cecillia Cogan, PharmD Clinical Pharmacist 09/21/2023  12:15 PM

## 2023-09-21 NOTE — H&P (View-Only) (Signed)
 RV looked better on bedside echo this afternoon. IVC not dilated.   Frequent PI events, although improved from this am.  Will give 250 cc LR.

## 2023-09-21 NOTE — Progress Notes (Signed)
 LVAD Coordinator Rounding Note:  Admitted 09/13/23 from [facility] for acute respiratory distress. Pt reported increased bloating and SOB since Tuesday. CXR showed diffuse pulmonary edema on admission. Pt placed on Bipap and given IV Lasix  without success. Pt intubated in ED by CCM.  HM3 LVAD implanted on 04/20/17 by Dr Sherene Dilling under destination therapy criteria.  Pt sitting up in bed watching TV and eating breakfast. Speed increased to 5700 over the weekend. Echo done yesterday and speed dropped back to 5600.   Vital signs: Temp: 97.7 HR: 65-aflutter Doppler Pressure: 80 Automatic BP: 89/76 (82) O2 Sat: 95% on 2L/[place] Wt: 141.7>139.9>141.9 lbs  LVAD interrogation :  Speed: 5600 Flow: 4.2 Power: 4.2 w PI: 1.9   Alarms: none Events:  80+ today Hematocrit: 46  Fixed speed: 5600 Low speed limit: 5300  Drive Line: CDI. Drive line anchor re-applied. Next dressing change 09/22/23 by bedside nurse.  Labs:  LDH trend: 373>211>338>270>237  INR trend: 1.7>1.7>2.2>2.1>1.7  Cr trend: 2.19>2.75>2.53>2.8>2.48  WBC trend: 13.9>6.8>16.1  Anticoagulation Plan: -INR Goal: 1.5-2.0 -ASA Dose: none - Coumadin : ongoing  Device: -Medtronic BiV -Therapies: ON  Arrythmias: Hx Afib/AF- on Amiodarone  200 mg daily- restarted 08/25/23 following ICD shock  Plan/Recommendations:  1. Page VAD coordinator with any drive line or equipment concerns 2. Weekly drive line dressing changes by bedside RN  Adams Adams RN,BSN VAD Coordinator  Office: [phone]  24/7 Pager: [phone]

## 2023-09-21 NOTE — Progress Notes (Signed)
 RV looked better on bedside echo this afternoon. IVC not dilated.   Frequent PI events, although improved from this am.  Will give 250 cc LR.

## 2023-09-22 ENCOUNTER — Other Ambulatory Visit (HOSPITAL_COMMUNITY): Payer: Self-pay

## 2023-09-22 ENCOUNTER — Encounter (HOSPITAL_COMMUNITY)
Admission: EM | Disposition: A | Discharge status: Home Health | Payer: Self-pay | Source: Home / Self Care | Attending: Cardiology

## 2023-09-22 DIAGNOSIS — I509 Heart failure, unspecified: Secondary | ICD-10-CM | POA: Diagnosis not present

## 2023-09-22 DIAGNOSIS — I5043 Acute on chronic combined systolic (congestive) and diastolic (congestive) heart failure: Secondary | ICD-10-CM | POA: Diagnosis not present

## 2023-09-22 DIAGNOSIS — Z95811 Presence of heart assist device: Secondary | ICD-10-CM | POA: Diagnosis not present

## 2023-09-22 HISTORY — PX: RIGHT HEART CATH: CATH118263

## 2023-09-22 LAB — POCT I-STAT EG7
Acid-Base Excess: 3 mmol/L — ABNORMAL HIGH (ref 0.0–2.0)
Acid-Base Excess: 3 mmol/L — ABNORMAL HIGH (ref 0.0–2.0)
Bicarbonate: 28.6 mmol/L — ABNORMAL HIGH (ref 20.0–28.0)
Bicarbonate: 28.8 mmol/L — ABNORMAL HIGH (ref 20.0–28.0)
Calcium, Ion: 1.22 mmol/L (ref 1.15–1.40)
Calcium, Ion: 1.23 mmol/L (ref 1.15–1.40)
HCT: 46 % (ref 39.0–52.0)
HCT: 46 % (ref 39.0–52.0)
Hemoglobin: 15.6 g/dL (ref 13.0–17.0)
Hemoglobin: 15.6 g/dL (ref 13.0–17.0)
O2 Saturation: 69 %
O2 Saturation: 72 %
Potassium: 3.8 mmol/L (ref 3.5–5.1)
Potassium: 3.8 mmol/L (ref 3.5–5.1)
Sodium: 133 mmol/L — ABNORMAL LOW (ref 135–145)
Sodium: 133 mmol/L — ABNORMAL LOW (ref 135–145)
TCO2: 30 mmol/L (ref 22–32)
TCO2: 30 mmol/L (ref 22–32)
pCO2, Ven: 47.2 mmHg (ref 44–60)
pCO2, Ven: 47.2 mmHg (ref 44–60)
pH, Ven: 7.391 (ref 7.25–7.43)
pH, Ven: 7.393 (ref 7.25–7.43)
pO2, Ven: 37 mmHg (ref 32–45)
pO2, Ven: 39 mmHg (ref 32–45)

## 2023-09-22 LAB — BASIC METABOLIC PANEL WITH GFR
Anion gap: 9 (ref 5–15)
BUN: 55 mg/dL — ABNORMAL HIGH (ref 6–20)
CO2: 28 mmol/L (ref 22–32)
Calcium: 9.1 mg/dL (ref 8.9–10.3)
Chloride: 95 mmol/L — ABNORMAL LOW (ref 98–111)
Creatinine, Ser: 2.13 mg/dL — ABNORMAL HIGH (ref 0.61–1.24)
GFR, Estimated: 35 mL/min — ABNORMAL LOW (ref >60)
Glucose, Bld: 81 mg/dL (ref 70–99)
Potassium: 4.1 mmol/L (ref 3.5–5.1)
Sodium: 132 mmol/L — ABNORMAL LOW (ref 135–145)

## 2023-09-22 LAB — GLUCOSE, CAPILLARY
Glucose-Capillary: 92 mg/dL (ref 70–99)
Glucose-Capillary: 130 mg/dL — ABNORMAL HIGH (ref 70–99)
Glucose-Capillary: 191 mg/dL — ABNORMAL HIGH (ref 70–99)
Glucose-Capillary: 94 mg/dL (ref 70–99)

## 2023-09-22 LAB — PROTIME-INR
INR: 1.4 — ABNORMAL HIGH (ref 0.8–1.2)
Prothrombin Time: 17.5 s — ABNORMAL HIGH (ref 11.4–15.2)

## 2023-09-22 LAB — LACTATE DEHYDROGENASE: LDH: 236 U/L — ABNORMAL HIGH (ref 98–192)

## 2023-09-22 SURGERY — RIGHT HEART CATH
Anesthesia: LOCAL

## 2023-09-22 MED ORDER — TORSEMIDE 20 MG PO TABS
20.0000 mg | ORAL_TABLET | Freq: Every day | ORAL | 6 refills | Status: DC
  Filled 2023-09-22: qty 30, 30d supply, fill #0

## 2023-09-22 MED ORDER — WARFARIN SODIUM 1 MG PO TABS: 1.0000 mg | ORAL_TABLET | Freq: Once | ORAL | Status: DC

## 2023-09-22 MED ORDER — LIDOCAINE HCL (PF) 1 % IJ SOLN
INTRAMUSCULAR | Status: AC
  Filled 2023-09-22: qty 30

## 2023-09-22 MED ORDER — HEPARIN (PORCINE) IN NACL 1000-0.9 UT/500ML-% IV SOLN
INTRAVENOUS | Status: DC | PRN
  Administered 2023-09-22 (×2): 500 mL

## 2023-09-22 MED ORDER — SODIUM CHLORIDE 0.9 % IV SOLN: INTRAVENOUS | Status: DC

## 2023-09-22 MED ORDER — WARFARIN SODIUM 1 MG PO TABS
1.0000 mg | ORAL_TABLET | Freq: Once | ORAL | Status: AC
  Administered 2023-09-22: 1 mg via ORAL
  Filled 2023-09-22: qty 1

## 2023-09-22 MED ORDER — SODIUM CHLORIDE 0.9 % IV BOLUS
INTRAVENOUS | Status: AC | PRN
  Administered 2023-09-22: 250 mL via INTRAVENOUS

## 2023-09-22 MED ORDER — WARFARIN SODIUM 2 MG PO TABS: 2.0000 mg | ORAL_TABLET | Freq: Once | ORAL | Status: DC

## 2023-09-22 SURGICAL SUPPLY — 6 items
CATH SWAN GANZ 7F STRAIGHT (CATHETERS) IMPLANT
GLIDESHEATH SLENDER 7FR .021G (SHEATH) IMPLANT
PACK CARDIAC CATHETERIZATION (CUSTOM PROCEDURE TRAY) ×1 IMPLANT
SHEATH PROBE COVER 6X72 (BAG) IMPLANT
TRANSDUCER W/STOPCOCK (MISCELLANEOUS) IMPLANT
TUBING ART PRESS 72 MALE/FEM (TUBING) IMPLANT

## 2023-09-22 NOTE — Plan of Care (Signed)
  Problem: Cardiac: Goal: LVAD will function as expected and patient will experience no clinical alarms Outcome: Progressing   Problem: Activity: Goal: Ability to tolerate increased activity will improve Outcome: Progressing   Problem: Education: Goal: Knowledge of General Education information will improve Description: Including pain rating scale, medication(s)/side effects and non-pharmacologic comfort measures Outcome: Progressing   Problem: Clinical Measurements: Goal: Diagnostic test results will improve Outcome: Progressing Goal: Respiratory complications will improve Outcome: Progressing Goal: Cardiovascular complication will be avoided Outcome: Progressing

## 2023-09-22 NOTE — Interval H&P Note (Signed)
 History and Physical Interval Note:  09/22/2023 1:10 PM  David [name]  has presented today for surgery, with the diagnosis of chf.  The various methods of treatment have been discussed with the patient and family. After consideration of risks, benefits and other options for treatment, the patient has consented to  Procedure(s): RIGHT HEART CATH (N/A) as a surgical intervention.  The patient's history has been reviewed, patient examined, no change in status, stable for surgery.  I have reviewed the patient's chart and labs.  Questions were answered to the patient's satisfaction.     Lauralee Poll

## 2023-09-22 NOTE — Plan of Care (Signed)
  Problem: Education: Goal: Patient will understand all VAD equipment and how it functions Outcome: Progressing Goal: Patient will be able to verbalize current INR target range and antiplatelet therapy for discharge home Outcome: Progressing   Problem: Cardiac: Goal: LVAD will function as expected and patient will experience no clinical alarms Outcome: Progressing   Problem: Activity: Goal: Ability to tolerate increased activity will improve Outcome: Progressing   Problem: Respiratory: Goal: Ability to maintain a clear airway and adequate ventilation will improve Outcome: Progressing   Problem: Role Relationship: Goal: Method of communication will improve Outcome: Progressing   Problem: Education: Goal: Knowledge of General Education information will improve Description: Including pain rating scale, medication(s)/side effects and non-pharmacologic comfort measures Outcome: Progressing   Problem: Health Behavior/Discharge Planning: Goal: Ability to manage health-related needs will improve Outcome: Progressing   Problem: Clinical Measurements: Goal: Ability to maintain clinical measurements within normal limits will improve Outcome: Progressing Goal: Will remain free from infection Outcome: Progressing Goal: Diagnostic test results will improve Outcome: Progressing Goal: Respiratory complications will improve Outcome: Progressing Goal: Cardiovascular complication will be avoided Outcome: Progressing   Problem: Activity: Goal: Risk for activity intolerance will decrease Outcome: Progressing   Problem: Nutrition: Goal: Adequate nutrition will be maintained Outcome: Progressing   Problem: Coping: Goal: Level of anxiety will decrease Outcome: Progressing   Problem: Elimination: Goal: Will not experience complications related to bowel motility Outcome: Progressing Goal: Will not experience complications related to urinary retention Outcome: Progressing   Problem:  Pain Managment: Goal: General experience of comfort will improve and/or be controlled Outcome: Progressing   Problem: Safety: Goal: Ability to remain free from injury will improve Outcome: Progressing   Problem: Skin Integrity: Goal: Risk for impaired skin integrity will decrease Outcome: Progressing   Problem: Education: Goal: Ability to describe self-care measures that may prevent or decrease complications (Diabetes Survival Skills Education) will improve Outcome: Progressing Goal: Individualized Educational Video(s) Outcome: Progressing   Problem: Coping: Goal: Ability to adjust to condition or change in health will improve Outcome: Progressing   Problem: Fluid Volume: Goal: Ability to maintain a balanced intake and output will improve Outcome: Progressing   Problem: Health Behavior/Discharge Planning: Goal: Ability to identify and utilize available resources and services will improve Outcome: Progressing Goal: Ability to manage health-related needs will improve Outcome: Progressing   Problem: Metabolic: Goal: Ability to maintain appropriate glucose levels will improve Outcome: Progressing   Problem: Nutritional: Goal: Maintenance of adequate nutrition will improve Outcome: Progressing Goal: Progress toward achieving an optimal weight will improve Outcome: Progressing   Problem: Skin Integrity: Goal: Risk for impaired skin integrity will decrease Outcome: Progressing   Problem: Tissue Perfusion: Goal: Adequacy of tissue perfusion will improve Outcome: Progressing   Problem: Education: Goal: Understanding of CV disease, CV risk reduction, and recovery process will improve Outcome: Progressing Goal: Individualized Educational Video(s) Outcome: Progressing   Problem: Activity: Goal: Ability to return to baseline activity level will improve Outcome: Progressing   Problem: Cardiovascular: Goal: Ability to achieve and maintain adequate cardiovascular  perfusion will improve Outcome: Progressing Goal: Vascular access site(s) Level 0-1 will be maintained Outcome: Progressing   Problem: Health Behavior/Discharge Planning: Goal: Ability to safely manage health-related needs after discharge will improve Outcome: Progressing

## 2023-09-22 NOTE — Progress Notes (Addendum)
 PHARMACY - ANTICOAGULATION CONSULT NOTE  Pharmacy Consult for warfarin Indication: LVAD  Allergies  Allergen Reactions   Plavix [Clopidogrel Bisulfate] Hives    Patient Measurements: Height: 5\' 5"  (165.1 cm) Weight: 66.6 kg (146 lb 12.8 oz) IBW/kg (Calculated) : 61.5  Vital Signs: Temp: 97.8 F (36.6 C) (05/30 1053) Temp Source: Oral (05/30 1053) BP: 85/76 (05/30 1053) Pulse Rate: 60 (05/29 2331)  Labs: Recent Labs    09/20/23 0331 09/21/23 0258 09/22/23 0539  LABPROT 23.3* 19.9* 17.5*  INR 2.1* 1.7* 1.4*  CREATININE 2.80* 2.48* 2.13*    Estimated Creatinine Clearance: 32.5 mL/min (A) (by C-G formula based on SCr of 2.13 mg/dL (H)).   Medical History: Past Medical History:  Diagnosis Date   AICD (automatic cardioverter/defibrillator) present    Anemia    Anxiety    CAD (coronary artery disease) 2009   AMI in 12/2007 with PCI to LAD, staged PCI to  mid/distal RCA, NSTEMI in 02/2009 with BMS to LCx   CHF (congestive heart failure) (HCC)    Chronic kidney disease 11/03/2016   stage 3 kidney disease   Dysrhythmia    "arrythmia problems at some point", "fatal rhythms"   History of blood transfusion    "I was bleeding from was rectum" (04/19/2017)   HLD (hyperlipidemia)    HTN (hypertension)    Hyperlipidemia 03/15/2011   Ischemic cardiomyopathy    Admitted in 07/2010 with CHF exacerbation   Myocardial infarction (HCC)    "I've had 4" (04/19/2017)   Pneumonia 2018 X 1   Seizures (HCC)    one seizure in 04/2016 during cardiac event (04/19/2017)   STEMI 2009 (anterior), 2010 (lateral), and 2012 (inferior) 03/14/2011     Assessment: 57 yoM with HM3 LVAD on warfarin PTA admitted with hypoxia requiring intubation. Pharmacy to manage warfarin. INR goal 1.5-2. Home dose is 1mg  daily.  5/29 - -Warfarin restarted 5/29 after holding for 5 days  -INR down to 1.4 today, subtherapeutic.  Hgb 15.1, plt 358 (last check 5/27).  LDH 236, stable -Ate majority of meals  yesterday, continues to have good appetite  Goal of Therapy:  INR 1.5-2 Monitor platelets by anticoagulation protocol: Yes   Plan:  Discharge home on warfarin 1 mg PO daily -- allow INR to slowly trend up given hx bleeding Monitor INR, CBC, LHD, and s/sx of bleeding.  Thank you for allowing pharmacy to participate in this patient's care,  Cecillia Cogan, PharmD Clinical Pharmacist 09/22/2023  10:55 AM

## 2023-09-22 NOTE — Evaluation (Signed)
 Occupational Therapy Evaluation Patient Details Name: David [name] MRN: [id number] DOB: [birth year] Today's Date: 09/22/2023   History of Present Illness   Pt is a 58 y.o. male who presented 09/14/23 with acute hypoxic respiratory failure. ETT 5/22 - 5/24. PMH: AICD, anemia, CAD, CHF, CKD, HLD, HTN, MI, seizures, STEMI, LVAD     Clinical Impressions Pt was ambulating, performing self care (sponge bathes) and managing his LVAD equipment independently prior to admission. His wife assists with caring for his driveline, managing his medications and IADLs. Pt is on 2L O2 at home. Pt pleasant and up in chair. Presents with impaired cognition. Pt with no awareness of controlled dangling from the chair or being wound in O2 sensor line. Pt needs cues for managing power source change. He indicated to PT earlier this morning that he was having weakness in grip strength, denied when OT asked about it. Pt did not recall being O2 dependent at home. Pt is overall mobilizing and completing ADLs at up to a supervision level due to cognition and safety. He will have 24 hour supervision of his wife when he returns home. Will follow acutely.     If plan is discharge home, recommend the following:   Supervision due to cognitive status;Assistance with cooking/housework;Direct supervision/assist for medications management;Direct supervision/assist for financial management;Assist for transportation     Functional Status Assessment   Patient has had a recent decline in their functional status and demonstrates the ability to make significant improvements in function in a reasonable and predictable amount of time.     Equipment Recommendations   None recommended by OT     Recommendations for Other Services         Precautions/Restrictions   Precautions Precautions: Fall (low fall risk) Precaution/Restrictions Comments: 2L O2 baseline; LVAD Restrictions Weight Bearing Restrictions Per Provider  Order: No     Mobility Bed Mobility               General bed mobility comments: in chair    Transfers Overall transfer level: Needs assistance Equipment used: None Transfers: Sit to/from Stand Sit to Stand: Supervision           General transfer comment: no physical assist, pt without attention to his controller or O2 sensor      Balance Overall balance assessment: Mild deficits observed, not formally tested                                         ADL either performed or assessed with clinical judgement   ADL Overall ADL's : Needs assistance/impaired Eating/Feeding: Independent;Sitting   Grooming: Supervision/safety;Standing   Upper Body Bathing: Supervision/ safety;Sitting   Lower Body Bathing: Supervison/ safety;Sit to/from stand   Upper Body Dressing : Set up;Sitting   Lower Body Dressing: Supervision/safety;Sit to/from stand   Toilet Transfer: Supervision/safety   Toileting- Architect and Hygiene: Supervision/safety       Functional mobility during ADLs: Supervision/safety       Vision Ability to See in Adequate Light: 0 Adequate Patient Visual Report: No change from baseline       Perception         Praxis         Pertinent Vitals/Pain Pain Assessment Pain Assessment: No/denies pain     Extremity/Trunk Assessment Upper Extremity Assessment Upper Extremity Assessment: Right hand dominant   Lower Extremity Assessment Lower Extremity Assessment: Defer to  PT evaluation   Cervical / Trunk Assessment Cervical / Trunk Assessment: Normal   Communication Communication Communication: No apparent difficulties   Cognition Arousal: Alert Behavior During Therapy: WFL for tasks assessed/performed Cognition: Cognition impaired     Awareness: Intellectual awareness impaired, Online awareness impaired Memory impairment (select all impairments): Working [employer] fast streamer, Copywriter, advertising, Comptroller functioning impairment (select all impairments): Initiation, Reasoning, Problem solving, Sequencing                   Following commands: Intact       Cueing  General Comments   Cueing Techniques: Verbal cues;Visual cues  pt needed a couple moments of assistance/cuing to manage his LVAD lines to switch battery power <> wall power; VSS on 3L O2; LVAD settings 4.4-4.5 pump flow, 5600 pump speed, PI 2.5, pump power 4.3   Exercises     Shoulder Instructions      Home Living Family/patient expects to be discharged to:: Private residence Living Arrangements: Spouse/significant other Available Help at Discharge: Family;Available 24 hours/day Type of Home: Apartment Home Access: Stairs to enter [employer] of Steps: 2 Entrance Stairs-Rails: None Home Layout: One level     Bathroom Shower/Tub: Sponge bathes at baseline   [employer]: Standard Bathroom Accessibility: Yes   Home Equipment: None   Additional Comments: on 2L O2      Prior Functioning/Environment Prior Level of Function : Needs assist             Mobility Comments: no AD, no falls in past 6 months ADLs Comments: Wife changes LVAD dressing. Pt sponges at baseline. Pt manages his LVAD power sources. Wife manages meds and does driving and IADLs.    OT Problem List: Decreased cognition   OT Treatment/Interventions: Cognitive remediation/compensation;Self-care/ADL training      OT Goals(Current goals can be found in the care plan section)   Acute Rehab OT Goals OT Goal Formulation: With patient Time For Goal Achievement: 10/06/23 Potential to Achieve Goals: Good ADL Goals Pt Will Perform Grooming: standing;Independently Pt Will Transfer to Toilet: ambulating;regular height toilet;Independently Pt Will Perform Toileting - Clothing Manipulation and hygiene: Independently;sit to/from stand Additional ADL Goal #1: Pt will manage LVAD equipment, including changing  power sources independently.   OT Frequency:  Min 2X/week    Co-evaluation              AM-PAC OT "6 Clicks" Daily Activity     Outcome Measure Help from another person eating meals?: None Help from another person taking care of personal grooming?: A Little Help from another person toileting, which includes using toliet, bedpan, or urinal?: A Little Help from another person bathing (including washing, rinsing, drying)?: A Little Help from another person to put on and taking off regular upper body clothing?: None Help from another person to put on and taking off regular lower body clothing?: A Little 6 Click Score: 20   End of Session Nurse Communication: Other (comment) (made aware pt is not attending to lines or controller)  Activity Tolerance: Patient tolerated treatment well Patient left: in chair;with call bell/phone within reach  OT Visit Diagnosis: Other symptoms and signs involving cognitive function                Time: 1005-1030 OT Time Calculation (min): 25 min Charges:  OT General Charges $OT Visit: 1 Visit OT Evaluation $OT Eval Moderate Complexity: 1 Mod OT Treatments $Self Care/Home Management : 8-22 mins  Avanell Leigh, OTR/L Acute  Rehabilitation Services Office: [phone]   Jonette Nestle 09/22/2023, 10:34 AM

## 2023-09-22 NOTE — Care Management Important Message (Signed)
 Important Message  Patient Details  Name: PRITHVI KOOI MRN: [id number] Date of Birth: [birth year]   Important Message Given:  Yes - Medicare IM     Wynonia Hedges 09/22/2023, 3:23 PM

## 2023-09-22 NOTE — TOC Transition Note (Signed)
 Transition of Care [facility]) - Discharge Note   Patient Details  Name: David [name] MRN: [id number] Date of Birth: 1965/06/24  Transition of Care [facility]) CM/SW Contact:  Benjiman Bras, RN Phone Number: 09/22/2023, 2:28 PM   Clinical Narrative:     TOC CM spoke to pt's wife and states [facility] RN came out. Requested [facility] HH, pt had agency in the past.   Contacted [employer] rep, Royston Cornea and they will have complete POC paperwork and have his PCP sign. States he will need to come to office and have RT evaluate for device so insurance will cover. Wife states they cannot afford an additional copay. He is currently paying $24 for oxygen each month.     Final next level of care: Home w Home Health Services Barriers to Discharge: No Barriers Identified   Patient Goals and CMS Choice Patient states their goals for this hospitalization and ongoing recovery are:: wants to recover CMS Medicare.gov Compare Post Acute Care list provided to:: Patient Represenative (must comment) Choice offered to / list presented to : Spouse      Discharge Placement                       Discharge Plan and Services Additional resources added to the After Visit Summary for     Discharge Planning Services: CM Consult Post Acute Care Choice: Home Health                    HH Arranged: RN, PT [facility] Agency: [facility] Date [facility] Agency Contacted: 09/22/23 Time HH Agency Contacted: [phone] Representative spoke with at [facility] Agency: Imelda Man  Social Drivers of Health (SDOH) Interventions SDOH Screenings   Food Insecurity: No Food Insecurity (09/16/2023)  Housing: Low Risk  (09/16/2023)  Transportation Needs: No Transportation Needs (09/16/2023)  Utilities: Not At Risk (09/16/2023)  Depression (PHQ2-9): High Risk (07/19/2021)  Financial Resource Strain: High Risk (07/04/2022)  Physical Activity: Inactive (03/10/2017)  Stress: No Stress Concern Present (03/10/2017)  Tobacco Use: Medium Risk (09/14/2023)      Readmission Risk Interventions    08/11/2023    1:58 PM  Readmission Risk Prevention Plan  Transportation Screening Complete  PCP or Specialist Appt within 5-7 Days Complete  Home Care Screening Complete  Medication Review (RN CM) Complete

## 2023-09-22 NOTE — Evaluation (Signed)
 Physical Therapy Evaluation Patient Details Name: David [name] MRN: [id number] DOB: [birth year] Today's Date: 09/22/2023  History of Present Illness  Pt is a 58 y.o. male who presented 09/14/23 with acute hypoxic respiratory failure. ETT 5/22 - 5/24. PMH: AICD, anemia, CAD, CHF, CKD, HLD, HTN, MI, seizures, STEMI, LVAD  Clinical Impression  Pt presents with condition above and deficits mentioned below, see PT Problem List. PTA, he was independent without AD, living with his wife in a 1-level apartment with x2 STE. Currently, the pt is needing intermittent assistance/cuing to correctly manage his LVAD lines when switching between wall power and battery power. He also reporting decreased grip strength, impacting his ease with unscrewing and managing his lines independently. He does display some very mild balance and endurance deficits, but overall was able to perform all functional mobility safely without LOB, AD, or assistance. He will likely progress quickly back to his baseline and not need any post acute PT. Will continue to follow acutely to maximize his return to baseline and continue to practice managing his LVAD to promote independence.     If plan is discharge home, recommend the following: Assistance with cooking/housework;Assist for transportation;Help with stairs or ramp for entrance;Other (comment) (assistance for managing LVAD)   Can travel by private vehicle        Equipment Recommendations None recommended by PT  Recommendations for Other Services       Functional Status Assessment Patient has had a recent decline in their functional status and demonstrates the ability to make significant improvements in function in a reasonable and predictable amount of time.     Precautions / Restrictions Precautions Precautions: Fall;Other (comment) (low fall risk) Precaution/Restrictions Comments: 2L O2 baseline; LVAD Restrictions Weight Bearing Restrictions Per Provider Order: No       Mobility  Bed Mobility Overal bed mobility: Modified Independent             General bed mobility comments: HOB elevated, no assistance needed to transition supine to sit L EOB.    Transfers Overall transfer level: Independent Equipment used: None               General transfer comment: No LOB noted when standing from EOB    Ambulation/Gait Ambulation/Gait assistance: Supervision Gait Distance (Feet): 300 Feet Assistive device: None Gait Pattern/deviations: Step-through pattern, Decreased stride length Gait velocity: reduced Gait velocity interpretation: 1.31 - 2.62 ft/sec, indicative of limited community ambulator   General Gait Details: Pt ambulates with slightly slowed, step-through gait pattern. No LOB noted, supervision for safety  Stairs            Wheelchair Mobility     Tilt Bed    Modified Rankin (Stroke Patients Only)       Balance Overall balance assessment: Mild deficits observed, not formally tested                                           Pertinent Vitals/Pain Pain Assessment Pain Assessment: No/denies pain    Home Living Family/patient expects to be discharged to:: Private residence Living Arrangements: Spouse/significant other Available Help at Discharge: Family;Available 24 hours/day Type of Home: Apartment Home Access: Stairs to enter Entrance Stairs-Rails: None Entrance Stairs-Number of Steps: 2   Home Layout: One level Home Equipment: None Additional Comments: on 2L O2    Prior Function Prior Level of Function : Independent/Modified Independent  Mobility Comments: no AD, no falls in past 6 months ADLs Comments: Wife changes LVAD dressing. Pt sponges at baseline. Pt manages his LVAD batteries. Wife manages meds and does driving.     Extremity/Trunk Assessment   Upper Extremity Assessment Upper Extremity Assessment: Defer to OT evaluation (reports weakness in hand grip strength  impacting ease with managing LVAD lines)    Lower Extremity Assessment Lower Extremity Assessment: Overall WFL for tasks assessed    Cervical / Trunk Assessment Cervical / Trunk Assessment: Normal  Communication   Communication Communication: No apparent difficulties    Cognition Arousal: Alert Behavior During Therapy: WFL for tasks assessed/performed   PT - Cognitive impairments: Memory                       PT - Cognition Comments: A&O. Follows commands appropriately. Did have x1 issue with correctly matching LVAD line to wall power line and needed cues to correct. Did have x2 instances where he tried to unscrew the lines from too far up the line and needed correcting. Following commands: Intact       Cueing Cueing Techniques: Verbal cues, Visual cues     General Comments General comments (skin integrity, edema, etc.): pt needed a couple moments of assistance/cuing to manage his LVAD lines to switch battery power <> wall power; VSS on 3L O2; LVAD settings 4.4-4.5 pump flow, 5600 pump speed, PI 2.5, pump power 4.3    Exercises     Assessment/Plan    PT Assessment Patient needs continued PT services  PT Problem List Decreased strength;Decreased activity tolerance;Decreased balance;Cardiopulmonary status limiting activity       PT Treatment Interventions DME instruction;Gait training;Stair training;Functional mobility training;Therapeutic activities;Therapeutic exercise;Balance training;Neuromuscular re-education;Patient/family education;Cognitive remediation    PT Goals (Current goals can be found in the Care Plan section)  Acute Rehab PT Goals Patient Stated Goal: to go home PT Goal Formulation: With patient Time For Goal Achievement: 10/06/23 Potential to Achieve Goals: Good    Frequency Min 1X/week     Co-evaluation               AM-PAC PT "6 Clicks" Mobility  Outcome Measure Help needed turning from your back to your side while in a flat bed  without using bedrails?: None Help needed moving from lying on your back to sitting on the side of a flat bed without using bedrails?: None Help needed moving to and from a bed to a chair (including a wheelchair)?: None Help needed standing up from a chair using your arms (e.g., wheelchair or bedside chair)?: None Help needed to walk in hospital room?: A Little Help needed climbing 3-5 steps with a railing? : A Little 6 Click Score: 22    End of Session Equipment Utilized During Treatment: Oxygen Activity Tolerance: Patient tolerated treatment well Patient left: in chair;with call bell/phone within reach (RN reported no need for chair alarm) Nurse Communication: Mobility status;Other (comment) (pulse ox not reading; RN reporting no need for chair alarm) PT Visit Diagnosis: Unsteadiness on feet (R26.81);Other abnormalities of gait and mobility (R26.89)    Time: 0828-0902 PT Time Calculation (min) (ACUTE ONLY): 34 min   Charges:   PT Evaluation $PT Eval Moderate Complexity: 1 Mod PT Treatments $Therapeutic Activity: 8-22 mins PT General Charges $$ ACUTE PT VISIT: 1 Visit         Vernida Goodie, PT, DPT Acute Rehabilitation Services  Office: [phone]   Ellyn Hack 09/22/2023, 9:16 AM

## 2023-09-22 NOTE — Progress Notes (Addendum)
 Patient ID: David [name], male   DOB: 06-14-1965, 58 y.o.   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   Several recent admits for respiratory distress, initially with flu A then with HF/post-viral syndrome. At last admit concern for amio toxicity but ESR ok. Amio stopped but restarted 08/25/23 after ICD shock for VT.   Readmitted w/ several days progressive/severe SOB. In ER in extremis. CXR with diffuse bilateral infiltrates. Intubated by CCM.  Subjective/Interval Hx:    - Extubated on 5/24, now on [place] - 5/25 LVAD speed increased to 5700  - 5/28 LVAD speed decreased to 5600 d/t worsening RV failure   Sitting in chair this morning.   INR 1.4.   LVAD INTERROGATION:  HeartMate III LVAD:   Flow 4.3 liters/min, speed 5600, power 4.2 , PI 3.6 . Multiple PI events today, 5 speed drops for today.   Objective:    Vital Signs:   Temp:  [97.8 F (36.6 C)-98.1 F (36.7 C)] 97.8 F (36.6 C) (05/30 1053) Pulse Rate:  [60-69] 60 (05/29 2331) Resp:  [15-17] 15 (05/30 1053) BP: (85-95)/(72-85) 85/76 (05/30 1053) SpO2:  [90 %-98 %] 98 % (05/30 1053) Weight:  [66.6 kg] 66.6 kg (05/30 0351) Last BM Date : 09/20/23 Mean arterial Pressure  80s   Intake/Output:   Intake/Output Summary (Last 24 hours) at 09/22/2023 1306 Last data filed at 09/22/2023 1000 Gross per 24 hour  Intake 357 ml  Output 900 ml  Net -543 ml    Physical Exam  General:  Well appearing. No resp difficulty Neck: supple. JVP flat.  Cor: Mechanical heart sounds with LVAD hum present. Lungs: Clear Driveline: C/D/I; securement device intact and driveline incorporated Extremities: no cyanosis, clubbing, rash, edema Neuro: alert & oriented x3. Moves all 4 extremities w/o difficulty. Affect pleasant    Telemetry   AFL 60s (Personally reviewed)    EKG    N/A   Labs   Basic Metabolic Panel: Recent Labs  Lab 09/15/23 1750 09/16/23 0435 09/16/23 1138 09/16/23 1637 09/17/23 0456 09/17/23 2015 09/18/23 0335 09/19/23 0455 09/19/23 1433 09/20/23 0331 09/21/23 0258 09/22/23 0539  NA 138 142   < > 139   < > 133*   < > 132* 133* 131* 133* 132*  K 4.1 3.6   < > 3.8   < > 3.9   < > 2.9* 3.3* 3.5 3.2* 4.1  CL 103 103  --  100   < > 86*   < > 78* 81* 82* 89* 95*  CO2 23 27  --  26   < > 32   < > 36* 35* 32 31 28  GLUCOSE 153* 144*  --  132*   < > 125*   < > 103* 120* 100* 78 81  BUN 34* 41*  --  51*   < > 61*   < > 79* 85* 82* 74* 55*  CREATININE 2.94* 2.63*  --  2.55*   < > 2.50*   < > 2.53* 2.92* 2.80* 2.48* 2.13*  CALCIUM  8.9 9.1  --  8.8*   < > 8.9   < > 9.3 9.0 9.0 9.2 9.1  MG 2.7* 2.3  --  2.1  --  1.8  --   --  2.8*  --   --   --   PHOS 3.6 3.6  --  5.0*  --   --   --   --   --   --   --   --    < > = values in this interval not displayed.    Liver Function Tests: No results for input(s): "AST", "ALT", "ALKPHOS", "BILITOT", "PROT", "ALBUMIN " in the last 168 hours.  No results for input(s): "LIPASE", "AMYLASE" in the last 168 hours.  No results for input(s): "AMMONIA" in the last 168 hours.  CBC: Recent Labs  Lab 09/16/23 0435 09/16/23 1138 09/17/23 0456 09/18/23 0335 09/19/23 0455  WBC 9.0  --  13.8* 14.2* 16.1*  HGB 11.2* 12.9* 11.6* 13.1 15.1  HCT 35.8* 38.0* 36.2* 41.3 46.6  MCV 79.6*  --  78.2* 76.8* 76.1*  PLT 227  --  255 302 358    INR: Recent Labs  Lab 09/18/23 0335 09/19/23 0455 09/20/23 0331 09/21/23 0258 09/22/23 0539  INR 2.0* 2.2* 2.1* 1.7* 1.4*    Other results: EKG:    Imaging   DG CHEST PORT 1 VIEW Result Date: 09/20/2023 CLINICAL DATA:  Acute on chronic failure EXAM: PORTABLE CHEST 1 VIEW COMPARISON:  09/15/2023, 09/14/2023 FINDINGS: Removal of endotracheal and enteric tubes. Removal of right-sided central venous catheter.  Sternotomy and left-sided pacing device. LVAD similar in position. Cardiomegaly with aortic atherosclerosis. Decreased vascular congestion compared to prior. Mild reticular and ground-glass opacity at the bases. Suspected small left effusion with mild left basilar opacity, favor atelectasis. IMPRESSION: 1. Removal of endotracheal and enteric tubes. Removal of right-sided central venous catheter. 2. Cardiomegaly with decreased vascular congestion. Mild reticular and ground-glass opacity at the bases, this may be due to minimal residual edema. Suspected small left effusion with mild left basilar opacity, favor atelectasis. Electronically Signed   By: Esmeralda Hedge M.D.   On: 09/20/2023 17:40      Medications:     Scheduled Medications:  [MAR Hold] amiodarone   200 mg Oral Daily   [MAR Hold] atorvastatin   40 mg Oral Daily   [MAR Hold] Chlorhexidine  Gluconate Cloth  6 each Topical Daily   [MAR Hold] insulin  aspart  0-15 Units Subcutaneous TID WC   [MAR Hold] insulin  aspart  0-5 Units Subcutaneous QHS   [MAR Hold] magnesium  oxide  400 mg Oral BID   [MAR Hold] multivitamin with minerals  1 tablet Oral Daily   [MAR Hold] pantoprazole   40 mg Oral Daily   [MAR Hold] polyethylene glycol  17 g Oral Daily   [MAR Hold] revefenacin   175 mcg Nebulization Daily   [MAR Hold] senna-docusate  1 tablet Oral BID   [MAR Hold] tamsulosin   0.4 mg Oral Daily   [MAR Hold] traZODone   50 mg Oral QHS   [MAR Hold] warfarin  1 mg Oral ONCE-1600   [MAR Hold] Warfarin - Pharmacist Dosing Inpatient   Does not apply q1600    Infusions:  [START ON 09/23/2023] sodium chloride        PRN Medications: [MAR Hold] acetaminophen , [MAR Hold] fluticasone , [MAR Hold] ipratropium-albuterol , [MAR Hold] ondansetron  (ZOFRAN ) IV, [MAR Hold] mouth rinse    Assessment/Plan:    1. Acute Hypoxic Respiratory Failure: admitted earlier in the month with AHRF. ?PNA post viral infection. Newly requiring home O2. Presented this admission  with respiratory distress despite bipap requiring intubation. Now extubated and stable on supplemental O2 [place].  - Hi-Res chest CT 4/25: GG airspace disease in right lung, pulmonary edema, ATX  left base, emphysema. Initial concern for amiodarone  lung toxicity but would also consider aspiration PNA.  - Concern for amio toxicity last admission, recently placed back on amiodarone  for ICD shock. Continued concern for amiodarone  lung toxicity this admission, though ESR not significantly high (31), CRP 3.9. Continue off amiodarone . Reviewed CT with Dr. Felipe Horton, think problem is more likely volume overload than amiodarone  lung toxicity.  Solumedrol stopped.  - Respiratory panel negative Completed ceftriaxone /doxy (PCT 0.94).  - Continue to wean O2 as able. - Continue incentive spirometer.   2. Chronic systolic CHF with HM III LVAD implant 04/20/17 - RHC 4/15: RA 6, PA mean 35, PCWP 19, Fick CI 3.1, TD CI 3.0. Speed 5600 - Concern for RV dysfunction given HF presentation - Echo, EF < 20% with interventricular septum slightly right-ward, moderate RV dysfunction/moderate RV enlargement. IVC dilated.  With rightward septum,  speed to 5700 rpm.  - Limited echo 05/28 with worsening RV failure and interventricular septum bowing into LV. RV appeared slightly better after decreasing speed to 5600 rpm - MAPs better with holding amlodipine  but still with frequent PI events and speed drops. Reviewed with Dr. Bruce Caper, plan for RHC later today.  - Continue holding diuretics. - GDMT limited by renal fx/hyperkalemia     3. VAD management - VAD interrogated personally. Parameters stable.  - INR 1.4 (goal 1.5-2). PharmD dosing warfarin.  - LDH trending back down 338>270>237>236  4. AKI on CKD stage 3: baseline SCr 1.5-1.8 - sCr slightly better today, 2.8>2.48>2.13 after holding diuretics.  - follow BMET    5. Hx of Ventricular tachycardia - Had ICD shock x 2 for VT on 11/24 (device reprogrammed at the time) - S/p  fracture lead removal and ICD exchange 1/25 - ICD shock for VT 5/1, amio resumed - Stopped amiodarone  again this admit with concern for lung toxicity - No VT on tele   6. Persistent AFL - most recent ICD interrogation with 100% AF burden - Rate controlled.  - on warfarin; now off amiodarone    7. CAD:  s/p PCI RCA/PLOM with DES x2 and DES to mild LAD 1/18 - No chest pain.  - Continue statin. Off ASA due to GIB/AVMs   8. Urinary retention:  - Continue flomax .  - Bladder scan as needed.   9. Hypokalemia - K 4.1   I reviewed the LVAD parameters from today, and compared the results to the patient's prior recorded data.  No programming changes were made.  The LVAD is functioning within specified parameters.  The patient performs LVAD self-test daily.  LVAD interrogation was negative for any significant power changes, alarms or PI events/speed drops.  LVAD equipment check completed and is in good working order.  Back-up equipment present.   LVAD education done on emergency procedures and precautions and reviewed exit site care.  Length of Stay: 8  David Donna, David [name] 09/22/2023, 1:06 PM  VAD Team --- VAD ISSUES ONLY--- Pager [phone] (7am - 7am)  Advanced Heart Failure Team  Pager [phone] (M-F; 7a - 5p)  Please contact [facility] for night-coverage after hours (5p -7a ) and weekends on [email or web address]  Patient seen with APP, plan extensively discussed.   Subjective: - feels well   Exam: Blood pressure 93/75, pulse 76, temperature 98.3 F (36.8 C), temperature source Oral, resp. rate 14, height 5\' 5"  (1.651 m), weight 63 kg, SpO2 98%.  GENERAL: NAD Lungs- CTA CARDIAC:  JVP: 5 cm          Normal  LVAD HUM ABDOMEN: Soft, non-tender, non-distended.  EXTREMITIES: Warm and well perfused.  NEUROLOGIC: No obvious FND  A/P - improvement after decreasing speeds - plan for RHC today.    David [name] Advanced Heart Failure

## 2023-09-22 NOTE — TOC Progression Note (Signed)
 Transition of Care [facility]) - Progression Note    Patient Details  Name: David [name] MRN: [id number] Date of Birth: 12-Sep-1965  Transition of Care [facility] CM/SW Contact  Ernst Heap Phone Number: [phone] 09/22/2023, 11:34 AM  Clinical Narrative:   HF CSW called and scheduled patients hospital follow up appointment for Monday, October 02, 2023 at 10:00 AM.  TOC will continue following.      Expected Discharge Plan: Home/Self Care Barriers to Discharge: Continued Medical Work up  Expected Discharge Plan and Services   Discharge Planning Services: CM Consult Post Acute Care Choice: Home Health                                         Social Determinants of Health (SDOH) Interventions SDOH Screenings   Food Insecurity: No Food Insecurity (09/16/2023)  Housing: Low Risk  (09/16/2023)  Transportation Needs: No Transportation Needs (09/16/2023)  Utilities: Not At Risk (09/16/2023)  Depression (PHQ2-9): High Risk (07/19/2021)  Financial Resource Strain: High Risk (07/04/2022)  Physical Activity: Inactive (03/10/2017)  Stress: No Stress Concern Present (03/10/2017)  Tobacco Use: Medium Risk (09/14/2023)    Readmission Risk Interventions    08/11/2023    1:58 PM  Readmission Risk Prevention Plan  Transportation Screening Complete  PCP or Specialist Appt within 5-7 Days Complete  Home Care Screening Complete  Medication Review (RN CM) Complete

## 2023-09-22 NOTE — Progress Notes (Signed)
 LVAD Coordinator Rounding Note:  Admitted 09/13/23 from [facility] for acute respiratory distress. Pt reported increased bloating and SOB since Tuesday. CXR showed diffuse pulmonary edema on admission. Pt placed on Bipap and given IV Lasix  without success. Pt intubated in ED by CCM.  HM3 LVAD implanted on 04/20/17 by Dr Sherene Dilling under destination therapy criteria.  Pt sitting up in the chair having a breathing treatment. Tells me that he is feeling stronger and denies any difficulty breathing is currently on 3L/[place].  Pt accompanied by VAD coordinator to cath lab. No sedation given and no changes made to pts speed.   Vital signs: Temp: 97.7 HR: 70-aflutter Doppler Pressure: 82 Automatic BP: 89/75 (81) O2 Sat: 97% on 3L/[place] Wt: 141.7>139.9>141.9>146.8 lbs  LVAD interrogation :  Speed: 5600 Flow: 4.3 Power: 4.2 w PI: 2.1   Alarms: none Events:  30 today; 70+ yesterday Hematocrit: 46  Fixed speed: 5600 Low speed limit: 5300  Drive Line: Existing VAD dressing removed and site care performed using sterile technique. Drive line exit site cleaned with Chlora prep applicators x 2, allowed to dry, and [place] dressing with Silverlon patch applied. Exit site healed and incorporated, the velour is fully implanted at exit site. No redness, tenderness, drainage, foul odor or rash noted. Drive line anchor re-applied. Next dressing change 09/29/23 by bedside nurse.  Labs:  LDH trend: 373>211>338>270>237>236  INR trend: 1.7>1.7>2.2>2.1>1.7>1.4  Cr trend: 2.19>2.75>2.53>2.8>2.48>2.13  WBC trend: 13.9>6.8>16.1  Anticoagulation Plan: -INR Goal: 1.5-2.0 -ASA Dose: none - Coumadin : ongoing  Device: -Medtronic BiV -Therapies: ON  Arrythmias: Hx Afib/AF- on Amiodarone  200 mg daily- restarted 08/25/23 following ICD shock  Plan/Recommendations:  1. Page VAD coordinator with any drive line or equipment concerns 2. Weekly drive line dressing changes by bedside RN 3. Pt will be able to d/c home  tomorrow if ok with provider team on rounds. Hosp f/u 10/03/23 @ 1100 in VAD clinic  Adams Adams RN,BSN VAD Coordinator  Office: [phone]  24/7 Pager: [phone]

## 2023-09-23 ENCOUNTER — Encounter (HOSPITAL_COMMUNITY): Payer: Self-pay | Admitting: Cardiology

## 2023-09-23 ENCOUNTER — Other Ambulatory Visit (HOSPITAL_COMMUNITY): Payer: Self-pay

## 2023-09-23 DIAGNOSIS — I5021 Acute systolic (congestive) heart failure: Secondary | ICD-10-CM | POA: Diagnosis not present

## 2023-09-23 LAB — GLUCOSE, CAPILLARY
Glucose-Capillary: 97 mg/dL (ref 70–99)
Glucose-Capillary: 105 mg/dL — ABNORMAL HIGH (ref 70–99)

## 2023-09-23 LAB — CBC
HCT: 41.6 % (ref 39.0–52.0)
Hemoglobin: 13.4 g/dL (ref 13.0–17.0)
MCH: 24.7 pg — ABNORMAL LOW (ref 26.0–34.0)
MCHC: 32.2 g/dL (ref 30.0–36.0)
MCV: 76.8 fL — ABNORMAL LOW (ref 80.0–100.0)
Platelets: 285 10*3/uL (ref 150–400)
RBC: 5.42 MIL/uL (ref 4.22–5.81)
RDW: 15.7 % — ABNORMAL HIGH (ref 11.5–15.5)
WBC: 12.5 10*3/uL — ABNORMAL HIGH (ref 4.0–10.5)
nRBC: 0 % (ref 0.0–0.2)

## 2023-09-23 LAB — LACTATE DEHYDROGENASE: LDH: 232 U/L — ABNORMAL HIGH (ref 98–192)

## 2023-09-23 LAB — PROTIME-INR
INR: 1.4 — ABNORMAL HIGH (ref 0.8–1.2)
Prothrombin Time: 17.4 s — ABNORMAL HIGH (ref 11.4–15.2)

## 2023-09-23 MED ORDER — WARFARIN SODIUM 1 MG PO TABS
1.0000 mg | ORAL_TABLET | Freq: Every day | ORAL | Status: DC
  Filled 2023-09-23: qty 1

## 2023-09-23 NOTE — Plan of Care (Signed)
 Problem: Education: Goal: Patient will understand all VAD equipment and how it functions Outcome: Adequate for Discharge Goal: Patient will be able to verbalize current INR target range and antiplatelet therapy for discharge home Outcome: Adequate for Discharge   Problem: Cardiac: Goal: LVAD will function as expected and patient will experience no clinical alarms Outcome: Adequate for Discharge   Problem: Activity: Goal: Ability to tolerate increased activity will improve Outcome: Adequate for Discharge   Problem: Respiratory: Goal: Ability to maintain a clear airway and adequate ventilation will improve Outcome: Adequate for Discharge   Problem: Role Relationship: Goal: Method of communication will improve Outcome: Adequate for Discharge   Problem: Education: Goal: Knowledge of General Education information will improve Description: Including pain rating scale, medication(s)/side effects and non-pharmacologic comfort measures Outcome: Adequate for Discharge   Problem: Health Behavior/Discharge Planning: Goal: Ability to manage health-related needs will improve Outcome: Adequate for Discharge   Problem: Clinical Measurements: Goal: Ability to maintain clinical measurements within normal limits will improve Outcome: Adequate for Discharge Goal: Will remain free from infection Outcome: Adequate for Discharge Goal: Diagnostic test results will improve Outcome: Adequate for Discharge Goal: Respiratory complications will improve Outcome: Adequate for Discharge Goal: Cardiovascular complication will be avoided Outcome: Adequate for Discharge   Problem: Activity: Goal: Risk for activity intolerance will decrease Outcome: Adequate for Discharge   Problem: Nutrition: Goal: Adequate nutrition will be maintained Outcome: Adequate for Discharge   Problem: Coping: Goal: Level of anxiety will decrease Outcome: Adequate for Discharge   Problem: Elimination: Goal: Will not  experience complications related to bowel motility Outcome: Adequate for Discharge Goal: Will not experience complications related to urinary retention Outcome: Adequate for Discharge   Problem: Pain Managment: Goal: General experience of comfort will improve and/or be controlled Outcome: Adequate for Discharge   Problem: Safety: Goal: Ability to remain free from injury will improve Outcome: Adequate for Discharge   Problem: Skin Integrity: Goal: Risk for impaired skin integrity will decrease Outcome: Adequate for Discharge   Problem: Education: Goal: Ability to describe self-care measures that may prevent or decrease complications (Diabetes Survival Skills Education) will improve Outcome: Adequate for Discharge Goal: Individualized Educational Video(s) Outcome: Adequate for Discharge   Problem: Coping: Goal: Ability to adjust to condition or change in health will improve Outcome: Adequate for Discharge   Problem: Fluid Volume: Goal: Ability to maintain a balanced intake and output will improve Outcome: Adequate for Discharge   Problem: Health Behavior/Discharge Planning: Goal: Ability to identify and utilize available resources and services will improve Outcome: Adequate for Discharge Goal: Ability to manage health-related needs will improve Outcome: Adequate for Discharge   Problem: Metabolic: Goal: Ability to maintain appropriate glucose levels will improve Outcome: Adequate for Discharge   Problem: Nutritional: Goal: Maintenance of adequate nutrition will improve Outcome: Adequate for Discharge Goal: Progress toward achieving an optimal weight will improve Outcome: Adequate for Discharge   Problem: Skin Integrity: Goal: Risk for impaired skin integrity will decrease Outcome: Adequate for Discharge   Problem: Tissue Perfusion: Goal: Adequacy of tissue perfusion will improve Outcome: Adequate for Discharge   Problem: Education: Goal: Understanding of CV  disease, CV risk reduction, and recovery process will improve Outcome: Adequate for Discharge Goal: Individualized Educational Video(s) Outcome: Adequate for Discharge   Problem: Activity: Goal: Ability to return to baseline activity level will improve Outcome: Adequate for Discharge   Problem: Cardiovascular: Goal: Ability to achieve and maintain adequate cardiovascular perfusion will improve Outcome: Adequate for Discharge Goal: Vascular access site(s) Level 0-1  will be maintained Outcome: Adequate for Discharge   Problem: Health Behavior/Discharge Planning: Goal: Ability to safely manage health-related needs after discharge will improve Outcome: Adequate for Discharge

## 2023-09-25 ENCOUNTER — Ambulatory Visit (HOSPITAL_COMMUNITY): Payer: Self-pay | Admitting: Pharmacist

## 2023-09-25 LAB — POCT INR: INR: 1.3 — AB (ref 2.0–3.0)

## 2023-09-28 ENCOUNTER — Ambulatory Visit: Admitting: Student

## 2023-10-01 ENCOUNTER — Encounter (HOSPITAL_COMMUNITY): Payer: Self-pay | Admitting: *Deleted

## 2023-10-01 NOTE — Progress Notes (Signed)
 1610- Recieved page from pt's wife stating Said was bending over putting his shoes on for church and was shocked by his defibrillator. Denies feeling poorly prior to shock. States he "staggered" after shock but denies being symptomatic. Currently denies lightheadedness, dizziness, and shortness of breath. States his chest is sore following shock. VAD numbers stable- Flow 4.6 and PI 3.9. Remote report sent from ICD per [facility].  Offered options- go to ER for eval, or come to clinic tomorrow morning. Pt states he is ok, and prefers to come to clinic tomorrow AM. Advised if he feels unwell, or is shocked again, he will need to come to ER. Pt amd wife both verbalized understanding.   Dr Alease Amend updated on the above.   Paulo Bosworth RN VAD Coordinator  Office: [phone]  Pager: [phone]

## 2023-10-02 ENCOUNTER — Ambulatory Visit (HOSPITAL_BASED_OUTPATIENT_CLINIC_OR_DEPARTMENT_OTHER)
Admission: RE | Admit: 2023-10-02 | Discharge: 2023-10-02 | Disposition: A | Discharge status: Home / Self Care | Source: Ambulatory Visit | Attending: Cardiology | Admitting: Cardiology

## 2023-10-02 ENCOUNTER — Telehealth: Payer: Self-pay

## 2023-10-02 ENCOUNTER — Ambulatory Visit (HOSPITAL_COMMUNITY)
Admission: RE | Admit: 2023-10-02 | Discharge: 2023-10-02 | Disposition: A | Discharge status: Home / Self Care | Source: Ambulatory Visit | Attending: Cardiology | Admitting: Cardiology

## 2023-10-02 ENCOUNTER — Telehealth (HOSPITAL_COMMUNITY): Payer: Self-pay | Admitting: *Deleted

## 2023-10-02 ENCOUNTER — Ambulatory Visit (HOSPITAL_COMMUNITY): Payer: Self-pay | Admitting: Pharmacist

## 2023-10-02 VITALS — BP 94/82 | HR 76 | Ht 65.0 in | Wt 154.2 lb

## 2023-10-02 DIAGNOSIS — Z79899 Other long term (current) drug therapy: Secondary | ICD-10-CM | POA: Diagnosis not present

## 2023-10-02 DIAGNOSIS — Z7901 Long term (current) use of anticoagulants: Secondary | ICD-10-CM | POA: Insufficient documentation

## 2023-10-02 DIAGNOSIS — J9601 Acute respiratory failure with hypoxia: Secondary | ICD-10-CM | POA: Diagnosis not present

## 2023-10-02 DIAGNOSIS — I472 Ventricular tachycardia, unspecified: Secondary | ICD-10-CM | POA: Diagnosis not present

## 2023-10-02 DIAGNOSIS — Z955 Presence of coronary angioplasty implant and graft: Secondary | ICD-10-CM | POA: Diagnosis not present

## 2023-10-02 DIAGNOSIS — Z95811 Presence of heart assist device: Secondary | ICD-10-CM | POA: Insufficient documentation

## 2023-10-02 DIAGNOSIS — I4892 Unspecified atrial flutter: Secondary | ICD-10-CM | POA: Insufficient documentation

## 2023-10-02 DIAGNOSIS — N1831 Chronic kidney disease, stage 3a: Secondary | ICD-10-CM | POA: Diagnosis not present

## 2023-10-02 DIAGNOSIS — I5023 Acute on chronic systolic (congestive) heart failure: Secondary | ICD-10-CM | POA: Diagnosis not present

## 2023-10-02 DIAGNOSIS — I130 Hypertensive heart and chronic kidney disease with heart failure and stage 1 through stage 4 chronic kidney disease, or unspecified chronic kidney disease: Secondary | ICD-10-CM | POA: Diagnosis not present

## 2023-10-02 DIAGNOSIS — I5022 Chronic systolic (congestive) heart failure: Secondary | ICD-10-CM | POA: Diagnosis present

## 2023-10-02 DIAGNOSIS — I480 Paroxysmal atrial fibrillation: Secondary | ICD-10-CM | POA: Insufficient documentation

## 2023-10-02 DIAGNOSIS — Z4509 Encounter for adjustment and management of other cardiac device: Secondary | ICD-10-CM | POA: Insufficient documentation

## 2023-10-02 DIAGNOSIS — I2510 Atherosclerotic heart disease of native coronary artery without angina pectoris: Secondary | ICD-10-CM | POA: Insufficient documentation

## 2023-10-02 LAB — LACTATE DEHYDROGENASE: LDH: 212 U/L — ABNORMAL HIGH (ref 98–192)

## 2023-10-02 LAB — BASIC METABOLIC PANEL WITH GFR
Anion gap: 6 (ref 5–15)
BUN: 10 mg/dL (ref 6–20)
CO2: 26 mmol/L (ref 22–32)
Calcium: 8.9 mg/dL (ref 8.9–10.3)
Chloride: 105 mmol/L (ref 98–111)
Creatinine, Ser: 1.47 mg/dL — ABNORMAL HIGH (ref 0.61–1.24)
GFR, Estimated: 55 mL/min — ABNORMAL LOW (ref >60)
Glucose, Bld: 95 mg/dL (ref 70–99)
Potassium: 4.1 mmol/L (ref 3.5–5.1)
Sodium: 137 mmol/L (ref 135–145)

## 2023-10-02 LAB — MAGNESIUM: Magnesium: 1.7 mg/dL (ref 1.7–2.4)

## 2023-10-02 LAB — CBC
HCT: 41.8 % (ref 39.0–52.0)
Hemoglobin: 13.2 g/dL (ref 13.0–17.0)
MCH: 24.8 pg — ABNORMAL LOW (ref 26.0–34.0)
MCHC: 31.6 g/dL (ref 30.0–36.0)
MCV: 78.6 fL — ABNORMAL LOW (ref 80.0–100.0)
Platelets: 262 10*3/uL (ref 150–400)
RBC: 5.32 MIL/uL (ref 4.22–5.81)
RDW: 16.9 % — ABNORMAL HIGH (ref 11.5–15.5)
WBC: 13.1 10*3/uL — ABNORMAL HIGH (ref 4.0–10.5)
nRBC: 0 % (ref 0.0–0.2)

## 2023-10-02 LAB — ECHOCARDIOGRAM LIMITED
Est EF: <20
Height: 65 in
Weight: 2467.2 [oz_av]

## 2023-10-02 LAB — PROTIME-INR
INR: 1.3 — ABNORMAL HIGH (ref 0.8–1.2)
Prothrombin Time: 16.1 s — ABNORMAL HIGH (ref 11.4–15.2)

## 2023-10-02 MED ORDER — TORSEMIDE 20 MG PO TABS
20.0000 mg | ORAL_TABLET | ORAL | 3 refills | Status: AC
Start: 2023-10-02 — End: ?

## 2023-10-02 NOTE — Telephone Encounter (Signed)
 Alert remote transmission:  AT/AF daily burden>threshold, number of shocks delivered in an Episode Shock alert was reset on [facility]. Event occurred 6/8 @ 05:17, duration 53sec, EGM c/w sustained VT, V>A, HR 241, ATP delivered x6, followed by 40J of HV therapy, coverting to AS/VP - route to triage high alert per protocol Second VT classified event 5/5 @ 20:06, HR 176, EGM AFL with RVR vs VT,  pace terminated with 1 burst of ATP to AFL/VS, Warfarin 2 SVT-AF, both <25sec, HR's 182-207 Presenting regular AS/VS Follow up as scheduled. LA, CVRS  VAD team aware and seeing patient this morning at 0900. Sending to EP MD as Arlie Lain.

## 2023-10-02 NOTE — Progress Notes (Signed)
 Patient presents for nurse visit in VAD Clinic today with David [name]) wife. Denies issues with VAD equipment or drive line.   Pt walked into clinic independently. He is wearing 2 L O2. States he is feeling good. Denies lightheadedness dizziness, shortness of breath, heart failure symptoms, and signs of bleeding.   Pt received shock from ICD yesterday while bending over. Device clinic interrogated, Dr. Bruce Caper reviewed note. Pt will take extra 800 mg today for loading dose and resume 200 mg daily tomorrow. David [name], PharmD updated.   Bedside limited echo performed with Dr. Bruce Caper. Torsemide  decreased to 20 mg every other day.   Vital Signs: Doppler Pressure: 88 Automatic BP: 94/82 (88) HR: 85  SPO2: 97% on 2L    Weight: 154.2 lb w/ eqt Last weight: 138.6 lb w/o eqt-- hospital discharge  VAD Indication: Destination therapy- CKD   VAD interrogation & Equipment Management: Speed: 5600 Flow: 4.9 Power: 4.4 w    PI: 3.8   Alarms: low voltage advisories Events: rare  Fixed speed 5600 Low speed limit: 5300   Primary Controller: Replace back up battery in 32 months Back up controller: Replace back up battery in 28 months   Annual Equipment Maintenance on UBC/PM was performed on 03/27/2023.   I reviewed the LVAD parameters from today and compared the results to the patient's prior recorded data. LVAD interrogation was NEGATIVE for significant power changes, negative for clinical alarms and STABLE for PI events/speed drops. No programming changes were made and pump is functioning within specified parameters. Pt is performing daily controller and system monitor self tests along with completing weekly and monthly maintenance for LVAD equipment.   LVAD equipment check completed and is in good working order. Back up equipment present at this appointment.  Exit Site Care: Drive line is being maintained weekly by David [name], his wife. Exit site healed and incorporated, the velour is fully  implanted at exit site. No redness, drainage, rash, or foul odor noted. Pt denies fever or chills. Provided patient with 8 weekly dressing kits, 4 anchors, and 3 boxes of large Tegaderm.   Device: Medtronic Bi-V Therapies: VF 250 VT 167-300 Pacing: DDD 60 Last check: 10/01/23  BP & Labs:  Doppler BP 88 - Doppler is reflecting MAP  Hgb 13.2 - No S/S of bleeding. Specifically denies melena/BRBPR or nosebleeds.  LDH pending with established baseline of 165- 250. Denies tea-colored urine. No power elevations noted on interrogation.    Patient Instructions:  Take Torsemide  20 mg every other day. Take an extra 400 mg Amiodarone  two times today (in addition to the 200 mg he took this morning). Then go back to taking 200 mg daily. Labs today - will call you if abnormal.  Return to VAD Clinic in 3 weeks - see below.  Please call VAD clinic or pager if any questions, concerns, or issues prior to your next appointment.   Wynetta Heckle RN VAD Coordinator  Office: [phone]  24/7 Pager: [phone]

## 2023-10-02 NOTE — Telephone Encounter (Signed)
 Called patient wife per Dr. Mitzie Anda with lab results. Magnesium  low today, instructed to take one extra 400 mg magnesium  today. Wife verbalized understanding of same.

## 2023-10-02 NOTE — Patient Instructions (Addendum)
 Take Torsemide  20 mg every other day. Take an extra 400 mg Amiodarone  two times today (in addition to the 200 mg he took this morning). Then go back to taking 200 mg daily. Labs today - will call you if abnormal.  Return to VAD Clinic in 3 weeks - see below.  Please call VAD clinic or pager if any questions, concerns, or issues prior to your next appointment.

## 2023-10-03 ENCOUNTER — Encounter (HOSPITAL_COMMUNITY): Admitting: Cardiology

## 2023-10-03 NOTE — Progress Notes (Signed)
 LVAD Clinic Follow Up Note HF MD: Dr Julane Ny  HPI: David [name] is a 58 y.o. male with history of ICD (09 PCI to LAD, mRCA, '10 PCI to Lcx and '12 mRCA), HFrEF (EF ~20% for > 5 years), HLD and HTN. S/P LVAD HM-III 04/20/18.    Admitted 04/19/17 - 05/27/2017 for scheduled LVAD implant. On 12/27 he underwent HMIII LVAD implant. Hospital course c/b PNA shock liver and AKI. Required short term HD.  Readmitted 05/30/17 hgb 3.8. INR 2.9.transfused 4u pRBCs. 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 Underwent ventriculostomy. Admission c/b by periods of confusion and urinary retention. Discharged to CIR  Was seen for acute visit on 03/27/23 for ICD firing x 2 on Thanksgiving.. Electrolytes ok. Seen by Dr. Rodolfo Clan and ICD reprogrammed   Admitted on 07/10/23 for respiratory distress in setting of Influenza A infection.  Readmitted with 08/03/23 with recurrent respiratory distress and hypoxia. Felt to be combination of HF and possible residual infection. Diuresed. Underwent RHC which showed well optimized hemodynamics. VAD speed increased to 5600. Had persistent hypoxia despite abx and diuresis. Concern for amio toxicity but ESR was low. Hi-res CT had airspace disease and atx. Also increased mediastinal lymphadenopathy, Seen by Dr. Felipe Horton in Pulmonary. Recommended pulmonary toilet and home O2  Admitted from 5/22-5/31/25 with hypoxic respiratory failure requiring intubation. Treated with abx for possible PNA; aggressively diuresed and treated with steroids for amio toxicity (although this was later felt to be unlikely). He was diuresed and speeds were adjusted with RHC prior to discharge demonstrating adequate hemodynamics.   Today he presents due to an episode of VT at home. Device interrogation demonstrates multiple attempts at ATP followed by shock x 1 appropriately  delivered for VT. From a symptomatic standpoint, [name] reports doing very well before the shock and today. He has been taking torsemide  20mg  daily since discharge. His wife reports compliance with all meds including amiodarone  200mg  daily. Today he feels once more with no dyspnea, LE edema, chest pain.    VAD Indication: Destination therapy- CKD   VAD interrogation & Equipment Management: Speed: 5600 Flow: 4.9 Power: 4.4 w    PI: 3.8  Alarms: none Events: rare  Fixed speed 5600 Low speed limit: 5300   Primary Controller: Replace back up battery in 32 months Back up controller: Replace back up battery in 28 months   Annual Equipment Maintenance on UBC/PM was performed on 03/27/2023.   I reviewed the LVAD parameters from today and compared the results to the patient's prior recorded data. LVAD interrogation was NEGATIVE for significant power changes, negative for clinical alarms and STABLE for PI events/speed drops. No programming changes were made and pump is functioning within specified parameters. Pt is performing daily controller and system monitor self tests along with completing weekly and monthly maintenance for LVAD equipment.   Past Medical History:  Diagnosis Date   AICD (automatic cardioverter/defibrillator) present    Anemia    Anxiety    CAD (coronary artery disease) 2009   AMI in 12/2007 with PCI to LAD, staged PCI to  mid/distal RCA, NSTEMI in 02/2009 with BMS to LCx   CHF (congestive heart failure) (HCC)    Chronic kidney disease 11/03/2016   stage 3 kidney disease   Dysrhythmia    "arrythmia problems at some point", "fatal rhythms"   History of  blood transfusion    "I was bleeding from was rectum" (04/19/2017)   HLD (hyperlipidemia)    HTN (hypertension)    Hyperlipidemia 03/15/2011   Ischemic cardiomyopathy    Admitted in 07/2010 with CHF exacerbation   Myocardial infarction [facility])    "I've had 4" (04/19/2017)   Pneumonia 2018 X 1   Seizures (HCC)     one seizure in 04/2016 during cardiac event (04/19/2017)   STEMI 2009 (anterior), 2010 (lateral), and 2012 (inferior) 03/14/2011    Current Outpatient Medications  Medication Sig Dispense Refill   acetaminophen  (TYLENOL ) 500 MG tablet Take 1,000 mg by mouth 2 (two) times daily as needed for moderate pain (pain score 4-6), fever or headache.     amiodarone  (PACERONE ) 200 MG tablet Take 1 tablet (200 mg total) by mouth daily. 90 tablet 3   atorvastatin  (LIPITOR) 40 MG tablet Take 1 tablet (40 mg total) by mouth daily. 90 tablet 3   fluticasone  (FLONASE ) 50 MCG/ACT nasal spray Place 2 sprays into both nostrils daily. (Patient taking differently: Place 2 sprays into both nostrils daily as needed for allergies or rhinitis.) 9.9 mL 3   magnesium  oxide (MAG-OX) 400 (240 Mg) MG tablet Take 1 tablet (400 mg total) by mouth 2 (two) times daily. 180 tablet 3   Multiple Vitamins-Minerals (MENS 50+ MULTIVITAMIN) TABS Take 1 tablet by mouth daily.     pantoprazole  (PROTONIX ) 40 MG tablet Take 1 tablet (40 mg total) by mouth daily. 90 tablet 3   potassium chloride  SA (KLOR-CON  M) 20 MEQ tablet Take 1 tablet (20 mEq total) by mouth daily. 90 tablet 3   tamsulosin  (FLOMAX ) 0.4 MG CAPS capsule Take 1 capsule (0.4 mg total) by mouth daily. 90 capsule 3   traZODone  (DESYREL ) 50 MG tablet Take 1 tablet (50 mg total) by mouth at bedtime. 90 tablet 3   warfarin (COUMADIN ) 2 MG tablet TAKE 1 MG daily per VAD Pharm D (Patient taking differently: Take 1 mg by mouth daily at 4 PM.) 30 tablet 6   torsemide  (DEMADEX ) 20 MG tablet Take 1 tablet (20 mg total) by mouth every other day. 45 tablet 3   No current facility-administered medications for this encounter.    Plavix [clopidogrel bisulfate]  Review of systems complete and found to be negative unless listed in HPI.       Wt Readings from Last 3 Encounters:  10/02/23 69.9 kg (154 lb 3.2 oz)  09/23/23 63 kg (138 lb 14.2 oz)  08/25/23 74.8 kg (165 lb)    Vital  Signs: Temp: AF Doppler Pressure: 88 HR: 60  SPO2: 97%   Weight: 154lb w/ eqt Last weight: 138 lb w/o eqt-- hospital discharge    Physical Exam: Vitals:   10/02/23 0945 10/02/23 0946  BP: (!) 88/0 94/82  Pulse:  76  SpO2:  98%   GENERAL: NAD Lungs- CTA CARDIAC:  JVP: 5 cm          Normal LVAD hum; no peripheral edema.  ABDOMEN: Soft, non-tender, non-distended. Driveline site looks good.  EXTREMITIES: Warm and well perfused.  NEUROLOGIC: No obvious FND     ASSESSMENT AND PLAN:  1. Acute hypoxic respiratory failure - Believed to be secondary to volume overload & possible PNA - Significant improvement now functionally; reports minimal edema.   2. Chronic systolic CHF with HM III LVAD implant 04/20/17:  - NYHA II-III in setting of #1 - Euvolemic to hypovolemic on exam.  - TTE today with IVS midline; mild-mod reduced  RV function (stable), IVC 1.5cm.  - Decrease torsemide  to 20mg  every other day.   3. Ventricular tachycardia - Had ICD shock x 2 for VT on 03/23/23 (device reprogrammed at the time) - Recurrent VT with appropriate shock after failed ATP yesterday.  - TTE with midline septum; no signs of cannula positioning being the etiology of VT.  - Mg low; replete PO today.  - Hypovolemic on exam despite reported weight gain at home; decrease torsemide  to 20mg  every other day.  - May need to consider restart mexiletine if he has another episode.  - Increase amiodarone  to 400mg  TID; will transition back to 200mg  daily in 1 day. Hesitant to continue amiodarone  long term due to possible lung toxicity although this has been deemed to be unlikely by pulmonology.   4. PAF/AFL - had DC-CV on 10/17/18 with subsequent short episodes of AF on amio 100 daily - Had recurrent AF in 10/22.= - see above.   5. SAH - admit in 2/23 with [facility] requiring ventriculostomy - No significant residual deficits - Keep MAPs 70-80   6. h/o UGI AVM bleed - s/p APC x 4 lesions in 2/19 - No  recurrence - Continue PPI - Off ASA due to GIB.   7. CKD Stage IIIa:  - Creatinine baseline 1.3 - 1.7  - repeat BMP today.   8. VAD management - VAD interrogated personally. Parameters stable. - LDH 201 - INR goal 1.5-2.0 with h/o SAH Off ASA   - INR  1.3; discussed with pharmD.  - MAPs ok - DL ok  9. CAD:  s/p PCI RCA/PLOM with DES x2 and DES to mild LAD 1/18 - No angina.  - Continue statin. Off ASA due to GIB  10. Hypomagnesemia. - PO Mg 400mg  today.   11. HTN - needs tight BP control with h/o SAH.  - MAPs at goal.   11. [employer] fan - unfortunately this has been incurable even with VAD implant  I spent 60 minutes caring for this patient today including face to face time, ordering and reviewing labs, reviewing records from hospitalization, reviewing/obtaining bedside echocardiogram today, discussing long term prognosis with wife, seeing the patient, documenting in the record, and arranging follow ups.   Elvan Ebron, DO  8:52 AM

## 2023-10-05 ENCOUNTER — Telehealth: Payer: Self-pay | Admitting: *Deleted

## 2023-10-05 NOTE — Progress Notes (Signed)
 Remote ICD transmission.

## 2023-10-09 ENCOUNTER — Ambulatory Visit (HOSPITAL_COMMUNITY): Payer: Self-pay | Admitting: Pharmacist

## 2023-10-09 LAB — POCT INR: INR: 1.4 — AB (ref 2.0–3.0)

## 2023-10-16 ENCOUNTER — Ambulatory Visit (HOSPITAL_COMMUNITY): Payer: Self-pay | Admitting: Pharmacist

## 2023-10-16 DIAGNOSIS — Z95811 Presence of heart assist device: Secondary | ICD-10-CM

## 2023-10-16 LAB — POCT INR: INR: 1.3 — AB (ref 2.0–3.0)

## 2023-10-20 ENCOUNTER — Other Ambulatory Visit (HOSPITAL_COMMUNITY): Payer: Self-pay | Admitting: Unknown Physician Specialty

## 2023-10-20 DIAGNOSIS — Z95811 Presence of heart assist device: Secondary | ICD-10-CM

## 2023-10-20 DIAGNOSIS — Z7901 Long term (current) use of anticoagulants: Secondary | ICD-10-CM

## 2023-10-23 ENCOUNTER — Ambulatory Visit (HOSPITAL_COMMUNITY): Payer: Self-pay | Admitting: Pharmacist

## 2023-10-23 ENCOUNTER — Encounter (HOSPITAL_COMMUNITY): Admitting: Cardiology

## 2023-10-23 LAB — POCT INR: INR: 1.4 — AB (ref 2.0–3.0)

## 2023-10-30 ENCOUNTER — Ambulatory Visit (HOSPITAL_COMMUNITY): Payer: Self-pay | Admitting: Pharmacist

## 2023-10-30 LAB — POCT INR: INR: 1.4 — AB (ref 2.0–3.0)

## 2023-11-02 ENCOUNTER — Encounter (HOSPITAL_COMMUNITY): Admitting: Cardiology

## 2023-11-03 ENCOUNTER — Ambulatory Visit: Admitting: Cardiology

## 2023-11-03 ENCOUNTER — Encounter (HOSPITAL_COMMUNITY): Admitting: Cardiology

## 2023-11-06 ENCOUNTER — Ambulatory Visit (HOSPITAL_COMMUNITY): Payer: Self-pay | Admitting: Pharmacist

## 2023-11-06 LAB — POCT INR: INR: 1.5 — AB (ref 2.0–3.0)

## 2023-11-09 ENCOUNTER — Ambulatory Visit (HOSPITAL_COMMUNITY)
Admission: RE | Admit: 2023-11-09 | Discharge: 2023-11-09 | Disposition: A | Discharge status: Home / Self Care | Source: Ambulatory Visit | Attending: Internal Medicine | Admitting: Internal Medicine

## 2023-11-09 VITALS — BP 84/0 | HR 75 | Wt 157.8 lb

## 2023-11-09 DIAGNOSIS — J9601 Acute respiratory failure with hypoxia: Secondary | ICD-10-CM | POA: Diagnosis present

## 2023-11-09 DIAGNOSIS — Z79899 Other long term (current) drug therapy: Secondary | ICD-10-CM

## 2023-11-09 DIAGNOSIS — I472 Ventricular tachycardia, unspecified: Secondary | ICD-10-CM

## 2023-11-09 DIAGNOSIS — Z95811 Presence of heart assist device: Secondary | ICD-10-CM

## 2023-11-09 DIAGNOSIS — I5043 Acute on chronic combined systolic (congestive) and diastolic (congestive) heart failure: Secondary | ICD-10-CM

## 2023-11-09 DIAGNOSIS — I480 Paroxysmal atrial fibrillation: Secondary | ICD-10-CM

## 2023-11-09 DIAGNOSIS — Z7901 Long term (current) use of anticoagulants: Secondary | ICD-10-CM | POA: Diagnosis not present

## 2023-11-09 DIAGNOSIS — I609 Nontraumatic subarachnoid hemorrhage, unspecified: Secondary | ICD-10-CM

## 2023-11-09 LAB — TSH: TSH: <0.01 u[IU]/mL — ABNORMAL LOW (ref 0.350–4.500)

## 2023-11-09 LAB — COMPREHENSIVE METABOLIC PANEL WITH GFR
ALT: 22 U/L (ref 0–44)
AST: 29 U/L (ref 15–41)
Albumin: 2.9 g/dL — ABNORMAL LOW (ref 3.5–5.0)
Alkaline Phosphatase: 135 U/L — ABNORMAL HIGH (ref 38–126)
Anion gap: 10 (ref 5–15)
BUN: 14 mg/dL (ref 6–20)
CO2: 25 mmol/L (ref 22–32)
Calcium: 9.6 mg/dL (ref 8.9–10.3)
Chloride: 105 mmol/L (ref 98–111)
Creatinine, Ser: 1.65 mg/dL — ABNORMAL HIGH (ref 0.61–1.24)
GFR, Estimated: 48 mL/min — ABNORMAL LOW (ref >60)
Glucose, Bld: 121 mg/dL — ABNORMAL HIGH (ref 70–99)
Potassium: 4.3 mmol/L (ref 3.5–5.1)
Sodium: 140 mmol/L (ref 135–145)
Total Bilirubin: 1 mg/dL (ref 0.0–1.2)
Total Protein: 8.3 g/dL — ABNORMAL HIGH (ref 6.5–8.1)

## 2023-11-09 LAB — LACTATE DEHYDROGENASE: LDH: 211 U/L — ABNORMAL HIGH (ref 98–192)

## 2023-11-09 LAB — CBC
HCT: 45.5 % (ref 39.0–52.0)
Hemoglobin: 13.9 g/dL (ref 13.0–17.0)
MCH: 24.6 pg — ABNORMAL LOW (ref 26.0–34.0)
MCHC: 30.5 g/dL (ref 30.0–36.0)
MCV: 80.4 fL (ref 80.0–100.0)
Platelets: 240 K/uL (ref 150–400)
RBC: 5.66 MIL/uL (ref 4.22–5.81)
RDW: 15.9 % — ABNORMAL HIGH (ref 11.5–15.5)
WBC: 8.8 K/uL (ref 4.0–10.5)
nRBC: 0 % (ref 0.0–0.2)

## 2023-11-09 LAB — PREALBUMIN: Prealbumin: 29 mg/dL (ref 18–38)

## 2023-11-09 LAB — PROTIME-INR
INR: 1.3 — ABNORMAL HIGH (ref 0.8–1.2)
Prothrombin Time: 16.8 s — ABNORMAL HIGH (ref 11.4–15.2)

## 2023-11-09 LAB — T4, FREE: Free T4: 2.01 ng/dL — ABNORMAL HIGH (ref 0.61–1.12)

## 2023-11-09 NOTE — Patient Instructions (Addendum)
 No medication changes Follow up in 2 months  Will call for CT scan scheduling Coumadin  dosing per [facility]

## 2023-11-09 NOTE — Progress Notes (Signed)
 Patient presents for 2 month f/u and 6.5 yr Intermacs in VAD Clinic today with Marceil) wife . Denies issues with VAD equipment or drive line.   Pt walked into clinic independently. Pt states he has been off oxygen for about a month now. He periodically checks his spo2 and it ranges from 92-100. States he is feeling good. Denies lightheadedness dizziness, shortness of breath, heart failure symptoms, and signs of bleeding.   Pt states he is continuing to build his strength back up since discharge by he is walking everyday.  Repeat CT chest ordered per Dr. Cherrie and scheduled at [facility] 7/23 at 1000.  Vital Signs: Doppler Pressure: 84 Automatic BP: 89/64 (73) HR: 75 SPO2: 94% RA    Weight: 157.8 lb w/ eqt Last weight: 154.2 lb w/ eqt  VAD Indication: Destination therapy- CKD   VAD interrogation & Equipment Management: Speed: 5600 Flow: 5.2 Power: 4.4 w    PI: 2.8   Alarms: none Events: rare  Fixed speed 5600 Low speed limit: 5300   Primary Controller: Replace back up battery in 31 months Back up controller: Replace back up battery in 26 months   Annual Equipment Maintenance on UBC/PM was performed on 03/27/2023.   I reviewed the LVAD parameters from today and compared the results to the patient's prior recorded data. LVAD interrogation was NEGATIVE for significant power changes, negative for clinical alarms and STABLE for PI events/speed drops. No programming changes were made and pump is functioning within specified parameters. Pt is performing daily controller and system monitor self tests along with completing weekly and monthly maintenance for LVAD equipment.   LVAD equipment check completed and is in good working order. Back up equipment present at this appointment.  Patient refused today as pt states he has already exercised today and doesn't want to over exert himself.  Neurocognitive trail making completed correctly in 2 m 12 s.   [place]  Cardiomyopathy, EQ-5D-3L and post-VAD QOL completed by the patient independently by the patient with assistance from the Coordinator/caregiver.   [place] Cardiomyopathy Questionnaire     11/09/2023    3:00 PM 03/27/2023   11:22 AM 10/19/2022    2:52 PM  KCCQ-12  1 a. Ability to shower/bathe Not at all limited Not at all limited Not at all limited  1 b. Ability to walk 1 block Not at all limited Not at all limited Not at all limited  1 c. Ability to hurry/jog Moderately limited Slightly limited Moderately limited  2. Edema feet/ankles/legs Never over the past 2 weeks Never over the past 2 weeks 1-2 times a week  3. Limited by fatigue Never over the past 2 weeks Never over the past 2 weeks Never over the past 2 weeks  4. Limited by dyspnea Never over the past 2 weeks Never over the past 2 weeks Less than once a week  5. Sitting up / on 3+ pillows Never over the past 2 weeks Never over the past 2 weeks Never over the past 2 weeks  6. Limited enjoyment of life Not limited at all Slightly limited Not limited at all  7. Rest of life w/ symptoms Completely satisfied Completely satisfied Mostly satisfied  8 a. Participation in hobbies Slightly limited Slightly limited Did not limit at all  8 b. Participation in chores Did not limit at all Slightly limited Moderately limited  8 c. Visiting family/friends Did not limit at all Did not limit at all Did not limit at all  Exit Site Care: Drive line is being maintained weekly by [name], his wife. Exit site healed and incorporated, the velour is fully implanted at exit site. No redness, drainage, rash, or foul odor noted. Pt denies fever or chills. Provided patient with 8 weekly dressing kits and 8 anchors.  Device: Medtronic Bi-V Therapies: VF 250 VT 167-300 Pacing: DDD 60 Last check: 10/01/23  BP & Labs:  Doppler BP 84 - Doppler is reflecting MAP  Hgb 13.9 - No S/S of bleeding. Specifically denies melena/BRBPR or nosebleeds.  LDH 211 with  established baseline of 165- 250. Denies tea-colored urine. No power elevations noted on interrogation.    Patient Instructions:  No medication changes Follow up in 2 months  Will call for CT scan scheduling Coumadin  dosing per [facility]  Schuyler Lunger RN, BSN VAD Coordinator 24/7 Pager [phone]

## 2023-11-10 LAB — T3, FREE: T3, Free: 3.6 pg/mL (ref 2.0–4.4)

## 2023-11-13 ENCOUNTER — Telehealth (HOSPITAL_COMMUNITY): Payer: Self-pay

## 2023-11-13 ENCOUNTER — Ambulatory Visit (HOSPITAL_COMMUNITY): Payer: Self-pay | Admitting: Pharmacist

## 2023-11-13 LAB — POCT INR: INR: 1.2 — AB (ref 2.0–3.0)

## 2023-11-15 ENCOUNTER — Other Ambulatory Visit (HOSPITAL_BASED_OUTPATIENT_CLINIC_OR_DEPARTMENT_OTHER): Admitting: Radiology

## 2023-11-17 ENCOUNTER — Ambulatory Visit (HOSPITAL_BASED_OUTPATIENT_CLINIC_OR_DEPARTMENT_OTHER)
Admission: RE | Admit: 2023-11-17 | Discharge: 2023-11-17 | Disposition: A | Discharge status: Home / Self Care | Source: Ambulatory Visit | Attending: Internal Medicine | Admitting: Internal Medicine

## 2023-11-17 DIAGNOSIS — I5043 Acute on chronic combined systolic (congestive) and diastolic (congestive) heart failure: Secondary | ICD-10-CM | POA: Diagnosis not present

## 2023-11-17 DIAGNOSIS — J9601 Acute respiratory failure with hypoxia: Secondary | ICD-10-CM

## 2023-11-19 NOTE — Progress Notes (Signed)
 LVAD Clinic Follow Up Note HF MD: Dr Cherrie  HPI: David [name] is a 58 y.o. male with history of ICD (09 PCI to LAD, mRCA, '10 PCI to Lcx and '12 mRCA), HFrEF (EF ~20% for > 5 years), HLD and HTN. S/P LVAD HM-III 04/20/18.    Admitted 04/19/17 - 05/27/2017 for scheduled LVAD implant. On 12/27 he underwent HMIII LVAD implant. Hospital course c/b PNA shock liver and AKI. Required short term HD.  Readmitted 05/30/17 hgb 3.8. INR 2.9.transfused 4u pRBCs. 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 Underwent ventriculostomy. Admission c/b by periods of confusion and urinary retention. Discharged to CIR  Was seen for acute visit on 03/27/23 for ICD firing x 2 on Thanksgiving.. Electrolytes ok. Seen by Dr. Fernande and ICD reprogrammed   Admitted on 07/10/23 for respiratory distress in setting of Influenza A infection.  Readmitted with 08/03/23 with recurrent respiratory distress and hypoxia. Felt to be combination of HF and possible residual infection. Diuresed. Underwent RHC which showed well optimized hemodynamics. VAD speed increased to 5600. Had persistent hypoxia despite abx and diuresis. Concern for amio toxicity but ESR was low. Hi-res CT had airspace disease and atx. Also increased mediastinal lymphadenopathy, Seen by Dr. Claudene in Pulmonary. Recommended pulmonary toilet and home O2  Admitted from 5/22-5/31/25 with hypoxic respiratory failure requiring intubation. Treated with abx for possible PNA; aggressively diuresed and treated with steroids for amio toxicity (although this was later felt to be unlikely). He was diuresed and speeds were adjusted with RHC prior to discharge demonstrating adequate hemodynamics.   Seen in clinic 10/02/23 with VT at home. Device interrogation demonstrated multiple attempts at ATP followed by shock x 1 appropriately delivered  for VT. (Was taking amio 200 daily at time). Mg was low and supped  Here for f/u with his wife. Says he feels great. Better than he has in a long time. Now off oxygen. Follows sats with pulse ox and > 92%. Working to get his strength back. Doing all ADLs. No recurrent VT. Denies orthopnea or PND. No fevers, chills or problems with driveline. No bleeding, melena or neuro symptoms. No VAD alarms. Taking all meds as prescribed.   VAD Indication: Destination therapy- CKD   VAD interrogation & Equipment Management: Speed: 5600 Flow: 5.2 Power: 4.4 w    PI: 2.8   Alarms: none Events: rare  Fixed speed 5600 Low speed limit: 5300   Primary Controller: Replace back up battery in 31 months Back up controller: Replace back up battery in 26 months   Annual Equipment Maintenance on UBC/PM was performed on 03/27/2023.   Past Medical History:  Diagnosis Date   AICD (automatic cardioverter/defibrillator) present    Anemia    Anxiety    CAD (coronary artery disease) 2009   AMI in 12/2007 with PCI to LAD, staged PCI to  mid/distal RCA, NSTEMI in 02/2009 with BMS to LCx   CHF (congestive heart failure) (HCC)    Chronic kidney disease 11/03/2016   stage 3 kidney disease   Dysrhythmia    arrythmia problems at some point, fatal rhythms   History of blood transfusion    I was bleeding from was rectum (04/19/2017)   HLD (hyperlipidemia)    HTN (hypertension)    Hyperlipidemia 03/15/2011   Ischemic cardiomyopathy    Admitted in 07/2010 with CHF exacerbation  Myocardial infarction (HCC)    I've had 4 (04/19/2017)   Pneumonia 2018 X 1   Seizures (HCC)    one seizure in 04/2016 during cardiac event (04/19/2017)   STEMI 2009 (anterior), 2010 (lateral), and 2012 (inferior) 03/14/2011    Current Outpatient Medications  Medication Sig Dispense Refill   acetaminophen  (TYLENOL ) 500 MG tablet Take 1,000 mg by mouth 2 (two) times daily as needed for moderate pain (pain score 4-6), fever or  headache.     amiodarone  (PACERONE ) 200 MG tablet Take 1 tablet (200 mg total) by mouth daily. 90 tablet 3   atorvastatin  (LIPITOR) 40 MG tablet Take 1 tablet (40 mg total) by mouth daily. 90 tablet 3   fluticasone  (FLONASE ) 50 MCG/ACT nasal spray Place 2 sprays into both nostrils daily. 9.9 mL 3   magnesium  oxide (MAG-OX) 400 (240 Mg) MG tablet Take 1 tablet (400 mg total) by mouth 2 (two) times daily. 180 tablet 3   Multiple Vitamins-Minerals (MENS 50+ MULTIVITAMIN) TABS Take 1 tablet by mouth daily.     pantoprazole  (PROTONIX ) 40 MG tablet Take 1 tablet (40 mg total) by mouth daily. 90 tablet 3   potassium chloride  SA (KLOR-CON  M) 20 MEQ tablet Take 1 tablet (20 mEq total) by mouth daily. 90 tablet 3   tamsulosin  (FLOMAX ) 0.4 MG CAPS capsule Take 1 capsule (0.4 mg total) by mouth daily. 90 capsule 3   torsemide  (DEMADEX ) 20 MG tablet Take 1 tablet (20 mg total) by mouth every other day. 45 tablet 3   traZODone  (DESYREL ) 50 MG tablet Take 1 tablet (50 mg total) by mouth at bedtime. 90 tablet 3   warfarin (COUMADIN ) 2 MG tablet TAKE 1 MG daily per VAD Pharm D 30 tablet 6   No current facility-administered medications for this encounter.    Plavix [clopidogrel bisulfate]  Review of systems complete and found to be negative unless listed in HPI.       Wt Readings from Last 3 Encounters:  11/09/23 71.6 kg (157 lb 12.8 oz)  10/02/23 69.9 kg (154 lb 3.2 oz)  09/23/23 63 kg (138 lb 14.2 oz)      Vital Signs: Doppler Pressure: 84 Automatic BP: 89/64 (73) HR:75 SPO2: 94% RA    Weight: 157.8 lb w/ eqt Last weight: 154.2 lb w/ eqt      Physical Exam: Vitals:   11/09/23 1309 11/09/23 1420  BP: (!) 89/64 (!) 84/0  Pulse: 75   SpO2: 94%    General:  NAD.  HEENT: normal  Neck: supple. JVP not elevated.  Carotids 2+ bilat; no bruits. No lymphadenopathy or thryomegaly appreciated. Cor: LVAD hum.  Lungs: Clear. Abdomen: obese soft, nontender, non-distended. No hepatosplenomegaly.  No bruits or masses. Good bowel sounds. Driveline site clean. Anchor in place.  Extremities: no cyanosis, clubbing, rash. Warm no edema  Neuro: alert & oriented x 3. No focal deficits. Moves all 4 without problem   ASSESSMENT AND PLAN:  1. Acute hypoxic respiratory failure - Combination of volume overload & possible PNA and post-viral syndrome. Not felt to have amio toxicity - Much improved  - Will repeat chest CT to re-evaluate chest lymphadenopathy  2. Chronic systolic CHF with HM III LVAD implant 04/20/17:  - Improved NYHA II - Volume ok .  - Echo in 6/25 TTE  IVS midline; mild-mod reduced RV function (stable), IVC 1.5cm.  - Remains on torsemide  20 every other day  Can increase back to daily as needed  3. Ventricular tachycardia -  Had ICD shock x 2 for VT on 03/23/23 (device reprogrammed at the time) - Recurrent VT with appropriate shock after failed ATP in 6/25.   - Mg low and was repleted  Possible suction alarm. Torsemide  decreased.  - No recurrent VT  - Continue amio carefully - May need to consider restart mexiletine if he has another episode.   4. PAF/AFL - had DC-CV on 10/17/18 with subsequent short episodes of AF on amio 100 daily - Had recurrent AF in 10/22.= - remains in SR. Continue amio for VT  5. SAH - admit in 2/23 with [facility] requiring ventriculostomy - No significant residual deficits - Keep MAPs 70-80 - no change   6. h/o UGI AVM bleed - s/p APC x 4 lesions in 2/19 - stable - Continue PPI - Off ASA due to GIB.   7. CKD Stage IIIa:  - Creatinine baseline 1.3 - 1.7  - Scr 1.6 today  8. VAD management -VAD interrogated personally. Parameters stable. - LDH 211 - INR goal 1.5-2.0 with h/o SAH Off ASA   - INR  1.3; Discussed warfarin dosing with PharmD personally. - MAPs ok - DLsite ok  9. CAD:  s/p PCI RCA/PLOM with DES x2 and DES to mild LAD 1/18 - No s/s angina - Continue statin. Off ASA due to GIB  10. Hypomagnesemia. - Continue po mag 400  daily  11. HTN - needs tight BP control with h/o SAH.  - MAPs ok  11. [employer] fan - unfortunately this has been incurable even with VAD implant  I spent 45 minutes caring for this patient today including face to face time, ordering and reviewing labs, reviewing records from hospitalization, reviewing/obtaining bedside echocardiogram today, discussing long term prognosis with wife, seeing the patient, documenting in the record, and arranging follow ups.   Toribio Fuel, MD  9:26 PM

## 2023-11-20 ENCOUNTER — Ambulatory Visit (HOSPITAL_COMMUNITY): Payer: Self-pay | Admitting: Pharmacist

## 2023-11-20 ENCOUNTER — Ambulatory Visit (HOSPITAL_COMMUNITY): Payer: Self-pay | Admitting: Cardiology

## 2023-11-20 LAB — POCT INR: INR: 1.3 — AB (ref 2.0–3.0)

## 2023-11-22 ENCOUNTER — Encounter: Admitting: Cardiology

## 2023-11-22 ENCOUNTER — Other Ambulatory Visit (HOSPITAL_COMMUNITY): Payer: Self-pay

## 2023-11-22 ENCOUNTER — Ambulatory Visit

## 2023-11-22 DIAGNOSIS — E0590 Thyrotoxicosis, unspecified without thyrotoxic crisis or storm: Secondary | ICD-10-CM

## 2023-11-22 DIAGNOSIS — I255 Ischemic cardiomyopathy: Secondary | ICD-10-CM

## 2023-11-22 DIAGNOSIS — Z7901 Long term (current) use of anticoagulants: Secondary | ICD-10-CM

## 2023-11-22 DIAGNOSIS — Z95811 Presence of heart assist device: Secondary | ICD-10-CM

## 2023-11-22 DIAGNOSIS — T462X5A Adverse effect of other antidysrhythmic drugs, initial encounter: Secondary | ICD-10-CM

## 2023-11-22 LAB — CUP PACEART REMOTE DEVICE CHECK
Battery Remaining Longevity: 140 mo
Battery Voltage: 3.03 V
Brady Statistic RV Percent Paced: 39.6 %
Date Time Interrogation Session: 20250729214052
HighPow Impedance: 63 Ohm
Implantable Lead Connection Status: 753985
Implantable Lead Connection Status: 753985
Implantable Lead Connection Status: 753985
Implantable Lead Connection Status: 753985
Implantable Lead Implant Date: 20180409
Implantable Lead Implant Date: 20180716
Implantable Lead Implant Date: 20180716
Implantable Lead Implant Date: 20250129
Implantable Lead Location: 753858
Implantable Lead Location: 753858
Implantable Lead Location: 753859
Implantable Lead Location: 753860
Implantable Lead Model: 5076
Implantable Lead Model: 511212
Implantable Lead Model: 511212
Implantable Lead Serial Number: 263773
Implantable Lead Serial Number: 263775
Implantable Pulse Generator Implant Date: 20250129
Lead Channel Impedance Value: 323 Ohm
Lead Channel Impedance Value: 342 Ohm
Lead Channel Impedance Value: 380 Ohm
Lead Channel Pacing Threshold Amplitude: 0.625 V
Lead Channel Pacing Threshold Amplitude: 0.75 V
Lead Channel Pacing Threshold Pulse Width: 0.4 ms
Lead Channel Pacing Threshold Pulse Width: 0.4 ms
Lead Channel Sensing Intrinsic Amplitude: 2.3 mV
Lead Channel Sensing Intrinsic Amplitude: 9.5 mV
Lead Channel Setting Pacing Amplitude: 1.5 V
Lead Channel Setting Pacing Amplitude: 2 V
Lead Channel Setting Pacing Pulse Width: 0.4 ms
Lead Channel Setting Sensing Sensitivity: 0.3 mV
Zone Setting Status: 755011
Zone Setting Status: 755011

## 2023-11-22 NOTE — Telephone Encounter (Signed)
 Pt informed referral placed to Endocrinology per Dr. Cherrie. VAD Coordinator awaiting returned phone call from [facility] for scheduling.  Schuyler Lunger RN, BSN VAD Coordinator 24/7 Pager [phone]

## 2023-11-24 ENCOUNTER — Encounter (HOSPITAL_COMMUNITY): Payer: Self-pay | Admitting: Unknown Physician Specialty

## 2023-11-24 NOTE — Progress Notes (Signed)
 Pts wife paged VAD pager stating the pts ICD fired. Pt was in the passenger seat of their car and reached back into the back seat to get his wife's purse when the ICD fired. He denies any VAD alarms. Pt states that he felt normal before it fired and after. He tells me that he has felt really good lately and has been walking every morning. I ask him to send a remote transmission when he gets home. D/w Dr Rolan, pt/wife informed that if his ICD fires again to please go to the ER and notify VAD coordinator. If pt has no other issues the VAD team will follow up with him on Monday. Pt/wife verbalized understanding of all instructions.  Lauraine Ip RN, BSN VAD Coordinator  24/7 pager [phone]

## 2023-11-26 ENCOUNTER — Ambulatory Visit (HOSPITAL_COMMUNITY): Payer: Self-pay | Admitting: Internal Medicine

## 2023-11-27 ENCOUNTER — Ambulatory Visit (HOSPITAL_COMMUNITY): Payer: Self-pay | Admitting: Pharmacist

## 2023-11-27 ENCOUNTER — Telehealth: Payer: Self-pay

## 2023-11-27 ENCOUNTER — Other Ambulatory Visit (HOSPITAL_COMMUNITY): Payer: Self-pay | Admitting: *Deleted

## 2023-11-27 ENCOUNTER — Telehealth (HOSPITAL_BASED_OUTPATIENT_CLINIC_OR_DEPARTMENT_OTHER): Payer: Self-pay

## 2023-11-27 ENCOUNTER — Telehealth (HOSPITAL_COMMUNITY): Payer: Self-pay | Admitting: *Deleted

## 2023-11-27 ENCOUNTER — Ambulatory Visit (HOSPITAL_COMMUNITY)
Admission: RE | Admit: 2023-11-27 | Discharge: 2023-11-27 | Disposition: A | Discharge status: Home / Self Care | Source: Ambulatory Visit | Attending: Cardiology | Admitting: Cardiology

## 2023-11-27 DIAGNOSIS — Z95811 Presence of heart assist device: Secondary | ICD-10-CM

## 2023-11-27 DIAGNOSIS — I472 Ventricular tachycardia, unspecified: Secondary | ICD-10-CM | POA: Insufficient documentation

## 2023-11-27 DIAGNOSIS — I480 Paroxysmal atrial fibrillation: Secondary | ICD-10-CM

## 2023-11-27 DIAGNOSIS — Z7901 Long term (current) use of anticoagulants: Secondary | ICD-10-CM

## 2023-11-27 DIAGNOSIS — I5022 Chronic systolic (congestive) heart failure: Secondary | ICD-10-CM | POA: Diagnosis not present

## 2023-11-27 LAB — CBC
HCT: 46.4 % (ref 39.0–52.0)
Hemoglobin: 14.3 g/dL (ref 13.0–17.0)
MCH: 24.4 pg — ABNORMAL LOW (ref 26.0–34.0)
MCHC: 30.8 g/dL (ref 30.0–36.0)
MCV: 79.3 fL — ABNORMAL LOW (ref 80.0–100.0)
Platelets: 234 K/uL (ref 150–400)
RBC: 5.85 MIL/uL — ABNORMAL HIGH (ref 4.22–5.81)
RDW: 15.5 % (ref 11.5–15.5)
WBC: 12.5 K/uL — ABNORMAL HIGH (ref 4.0–10.5)
nRBC: 0 % (ref 0.0–0.2)

## 2023-11-27 LAB — PROTIME-INR
INR: 1.3 — ABNORMAL HIGH (ref 0.8–1.2)
Prothrombin Time: 16.4 s — ABNORMAL HIGH (ref 11.4–15.2)

## 2023-11-27 LAB — COMPREHENSIVE METABOLIC PANEL WITH GFR
ALT: 24 U/L (ref 0–44)
AST: 31 U/L (ref 15–41)
Albumin: 3 g/dL — ABNORMAL LOW (ref 3.5–5.0)
Alkaline Phosphatase: 134 U/L — ABNORMAL HIGH (ref 38–126)
Anion gap: 11 (ref 5–15)
BUN: 17 mg/dL (ref 6–20)
CO2: 24 mmol/L (ref 22–32)
Calcium: 9.4 mg/dL (ref 8.9–10.3)
Chloride: 104 mmol/L (ref 98–111)
Creatinine, Ser: 1.78 mg/dL — ABNORMAL HIGH (ref 0.61–1.24)
GFR, Estimated: 43 mL/min — ABNORMAL LOW (ref >60)
Glucose, Bld: 99 mg/dL (ref 70–99)
Potassium: 4.5 mmol/L (ref 3.5–5.1)
Sodium: 139 mmol/L (ref 135–145)
Total Bilirubin: 0.9 mg/dL (ref 0.0–1.2)
Total Protein: 8.4 g/dL — ABNORMAL HIGH (ref 6.5–8.1)

## 2023-11-27 LAB — MAGNESIUM: Magnesium: 1.8 mg/dL (ref 1.7–2.4)

## 2023-11-27 LAB — LACTATE DEHYDROGENASE: LDH: 230 U/L — ABNORMAL HIGH (ref 98–192)

## 2023-11-27 MED ORDER — AMIODARONE HCL 200 MG PO TABS: 200.0000 mg | ORAL_TABLET | Freq: Two times a day (BID) | ORAL | 3 refills | Status: DC

## 2023-11-27 NOTE — Progress Notes (Signed)
 Received call from pt's wife this morning reporting pt had 6 lb weight gain overnight, with associated shortness of breath and bloating. States he is using his O2 for the first time in a few months. He ate tacos last night for dinner. Last took Torsemide  20 mg on Saturday (scheduled every other day).   Pt paged VAD coordinator on Friday night stating he had been shocked by his defibrillator x 1. See separate VAD coordinator note 8/1. Remote transmission sent at that time. This VAD coordinator spoke with the device clinic this morning. Report shows pt sustained 2 shocks (1215 pm & 1953 PM). 1953 shock occurred following 6 failed ATP attempts. Shock successfully converted to a-sense v-paced rhythm. Pt denies feeling shock at 1215 but did feel shock at 1953.   Discussed the above with Dr Cherrie. Order received for pt to increase Amiodarone  to 200 mg BID, take Torsemide  40 mg today, and to obtain full labs today.   Discussed the above with [name] who verbalized understanding to all instructions. Plan for lab appt today at 11:15.   Pt scheduled with Dr Trixie 11/28/23 at 1 PM. Provided with office address and phone number. [name] verbalized understanding of endocrinology appt information.

## 2023-11-27 NOTE — Telephone Encounter (Signed)
 Reviewed lab results from today with Dr Cherrie. Per Dr Cherrie- pt to take 1 extra MagOxide tablet (for a total of 3 tablets today)- Mag 1.8.   Spoke with [name] regarding lab results and the above medication order. She verbalized understanding. Confirmed she is aware of appt with Dr Cindie on Thursday.   Isaiah Knoll RN VAD Coordinator  Office: [phone]  24/7 Pager: [phone]

## 2023-11-27 NOTE — Telephone Encounter (Signed)
 Patient experienced 2 ATP/ shock events on 11/24/23 at 1217 and 1953.    1217 event:  , duration 40sec, HR 231, EGM c/w sudden onset of sustained VT, ATP delivered x4, followed by 40J of HV therapy, converting arrhythmia, AFL/VS  1953 event:   duration 5sec, HR 214, EGM c/w AFL with sustained VT, V>A, ATP delivered x6 followed by 40J HV therapy, converting arrhythmia to AS/VP.  Spoke with Isaiah in the LVAD clinic.  Patient's wife reached out to on call over weekend for VAD team and reported the shocks.  Also, reports of SOB and possible fluid retention.  HF logistics on device report appear normal and stable.  Isaiah is following up with patient this am and Dr. Bensimhon.  Will follow their lead and notify Dr. Cindie to consider next steps and follow up for patient.

## 2023-11-27 NOTE — Telephone Encounter (Signed)
 Left David [name] a message advising we only draw labs for patients of our office.

## 2023-11-28 ENCOUNTER — Ambulatory Visit: Admitting: Internal Medicine

## 2023-11-28 ENCOUNTER — Encounter: Payer: Self-pay | Admitting: Internal Medicine

## 2023-11-28 ENCOUNTER — Other Ambulatory Visit (HOSPITAL_COMMUNITY)

## 2023-11-28 VITALS — HR 70 | Ht 65.0 in | Wt 158.8 lb

## 2023-11-28 DIAGNOSIS — E0590 Thyrotoxicosis, unspecified without thyrotoxic crisis or storm: Secondary | ICD-10-CM | POA: Diagnosis not present

## 2023-11-28 NOTE — Progress Notes (Signed)
 Patient ID: David [name], male   DOB: May 06, 1965, 58 y.o.   MRN: [id number]  HPI  David [name] is a 58 y.o.-year-old male, referred by his PCP, Dr. 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  He is here with his wife who offers part of the history, particularly related to his past medical history, medications, and symptoms.  Patient has a history of ischemic cardiomyopathy complicated by CHF and atrial fibrillation.  He has an LVAD (since 12/20218) and is on amiodarone , which was recently increased to 200 mg twice a day.  2 weeks ago, he was found to have a low TSH, while previous TSH levels were normal.  His free T3 levels were normal but free T4 was elevated starting in 2018.  Within the last 3 months, wife mentions that he had influenza A which was quite severe so he had to be hospitalized for 7 days.  He then developed pneumonia and had to be hospitalized again.  After discharge, he developed acute respiratory distress and was started on oxygen.  He has been feeling better lately, not using oxygen until yesterday.  I reviewed pt's thyroid  tests: Lab Results  Component Value Date   TSH <0.010 (L) 11/09/2023   TSH 3.382 03/27/2023   TSH 3.052 01/06/2023   TSH 1.916 05/10/2022   TSH 1.387 10/11/2021   TSH 2.076 08/03/2021   TSH 1.302 10/05/2020   TSH 2.464 03/24/2020   TSH 2.873 11/13/2019   TSH 2.496 06/24/2019   FREET4 2.01 (H) 11/09/2023   FREET4 1.45 (H) 03/27/2023   FREET4 1.28 (H) 01/06/2023   FREET4 1.15 (H) 05/10/2022   FREET4 1.22 (H) 08/03/2021   FREET4 1.43 (H) 03/13/2017   FREET4 1.18 03/06/2009   T3FREE 3.6 11/09/2023   T3FREE 2.7 03/27/2023   T3FREE 2.5 01/06/2023   T3FREE 2.6 05/10/2022   Antithyroid antibodies: No results found for: TSI  White blood count level was not low - 11/27/2023.SABRA  Pt denies: - feeling nodules in neck - hoarseness - dysphagia - choking - SOB with lying down  He denies: -  fatigue - excessive sweating/heat intolerance - tremors - anxiety - palpitations - hyperdefecation - weight loss - hair loss  He has cough - seasonal allergies.  Pt does not have a FH of thyroid  ds. No FH of thyroid  cancer. No h/o radiation tx to head or neck. No seaweed or kelp, no recent contrast studies. No steroid use other than Flonase . No herbal supplements. No Biotin use.  No lithium use.  Pt. also has a history of HTN, CAD with history of AMI, HL, CKD stage III, history of subarachnoid hemorrhage.  ROS: Constitutional: + see HPI Eyes: no blurry vision, no xerophthalmia ENT: no sore throat, + see HPI Cardiovascular: no CP/SOB/palpitations/leg swelling Respiratory: no cough/SOB Gastrointestinal: no N/V/D/C Musculoskeletal: no muscle/joint aches Skin: no rashes Neurological: no tremors/numbness/tingling/dizziness Psychiatric: no depression/anxiety  Past Medical History:  Diagnosis Date   AICD (automatic cardioverter/defibrillator) present    Anemia    Anxiety    CAD (coronary artery disease) 2009   AMI in 12/2007 with PCI to LAD, staged PCI to  mid/distal RCA, NSTEMI in 02/2009 with BMS to LCx   CHF (congestive heart failure) (HCC)    Chronic kidney disease 11/03/2016   stage 3 kidney disease   Dysrhythmia    arrythmia problems at some point, fatal rhythms   History of blood transfusion    I was bleeding from was rectum (04/19/2017)  HLD (hyperlipidemia)    HTN (hypertension)    Hyperlipidemia 03/15/2011   Ischemic cardiomyopathy    Admitted in 07/2010 with CHF exacerbation   Myocardial infarction [facility])    I've had 4 (04/19/2017)   Pneumonia 2018 X 1   Seizures (HCC)    one seizure in 04/2016 during cardiac event (04/19/2017)   STEMI 2009 (anterior), 2010 (lateral), and 2012 (inferior) 03/14/2011   Past Surgical History:  Procedure Laterality Date   AORTIC VALVE REPAIR N/A 04/20/2017   Procedure: AORTIC VALVE REPAIR;  Surgeon: Lucas Dorise POUR, MD;   Location: [facility] OR;  Service: Open Heart Surgery;  Laterality: N/A;   BIV ICD INSERTION CRT-D N/A 08/01/2016   Procedure: BiV ICD Insertion CRT-D;  Surgeon: Will Gladis Norton, MD;  Location: [facility] INVASIVE CV LAB;  Service: Cardiovascular;  Laterality: N/A;   CARDIAC CATHETERIZATION N/A 05/05/2016   Procedure: Right/Left Heart Cath and Coronary Angiography;  Surgeon: Toribio JONELLE Fuel, MD;  Location: [facility] INVASIVE CV LAB;  Service: Cardiovascular;  Laterality: N/A;   CARDIAC CATHETERIZATION N/A 05/09/2016   Procedure: Coronary Stent Intervention;  Surgeon: Peter M Swaziland, MD;  Location: [facility] INVASIVE CV LAB;  Service: Cardiovascular;  Laterality: N/A;   CARDIAC CATHETERIZATION N/A 05/09/2016   Procedure: Intravascular Pressure Wire/FFR Study;  Surgeon: Peter M Swaziland, MD;  Location: [facility] INVASIVE CV LAB;  Service: Cardiovascular;  Laterality: N/A;   CARDIOVERSION N/A 10/17/2018   Procedure: CARDIOVERSION;  Surgeon: Fuel Toribio JONELLE, MD;  Location: [facility] ENDOSCOPY;  Service: Cardiovascular;  Laterality: N/A;   CORONARY ANGIOPLASTY WITH STENT PLACEMENT     i've got a total of 12 stents (04/19/2017)   ELECTROPHYSIOLOGY STUDY N/A 06/24/2016   Procedure: Electrophysiology Study;  Surgeon: Elspeth JAYSON Sage, MD;  Location: [facility] INVASIVE CV LAB;  Service: Cardiovascular;  Laterality: N/A;   ENTEROSCOPY N/A 06/03/2017   Procedure: ENTEROSCOPY;  Surgeon: Leigh Elspeth SQUIBB, MD;  Location: [facility] ENDOSCOPY;  Service: Gastroenterology;  Laterality: N/A;   EPICARDIAL PACING LEAD PLACEMENT Left 11/07/2016   Procedure: LV EPICARDIAL PACING LEAD PLACEMENT VIA LEFT MINI THORACOTOMY;  Surgeon: Lucas Dorise POUR, MD;  Location: [facility] OR;  Service: Thoracic;  Laterality: Left;   ICD IMPLANT N/A 06/24/2016   Procedure: POSSIBLE  ICD Implant;  Surgeon: Elspeth JAYSON Sage, MD;  Location: [facility] INVASIVE CV LAB;  Service: Cardiovascular;  Laterality: N/A;   INSERTION OF IMPLANTABLE LEFT VENTRICULAR ASSIST DEVICE N/A 04/20/2017   Procedure:  INSERTION OF IMPLANTABLE LEFT VENTRICULAR ASSIST DEVICE, Aortic Valve repair;  Surgeon: Lucas Dorise POUR, MD;  Location: [facility] OR;  Service: Open Heart Surgery;  Laterality: N/A;  Heartmate 3   IR FLUORO GUIDE CV LINE RIGHT  05/08/2017   IR FLUORO GUIDE CV MIDLINE PICC RIGHT  03/14/2017   IR REMOVAL TUN CV CATH W/O FL  05/25/2017   IR US  GUIDE VASC ACCESS RIGHT  03/14/2017   IR US  GUIDE VASC ACCESS RIGHT  05/08/2017   LEAD EXTRACTION N/A 05/24/2023   Procedure: LEAD EXTRACTION;  Surgeon: Cindie Ole DASEN, MD;  Location: [facility] INVASIVE CV LAB;  Service: Cardiovascular;  Laterality: N/A;   LEFT HEART CATHETERIZATION WITH CORONARY ANGIOGRAM N/A 03/13/2011   Procedure: LEFT HEART CATHETERIZATION WITH CORONARY ANGIOGRAM;  Surgeon: Dorn JINNY Lesches, MD;  Location: [facility] CATH LAB;  Service: Cardiovascular;  Laterality: N/A;   MULTIPLE EXTRACTIONS WITH ALVEOLOPLASTY N/A 04/12/2017   Procedure: Extraction of tooth #'s 2, 5-12, 17, 20-29 with alveoloplasty amd maxillary right buccal exostoses reductions.;  Surgeon: Cyndee Tanda FALCON, DDS;  Location:  [facility] OR;  Service: Oral Surgery;  Laterality: N/A;   RIGHT HEART CATH N/A 04/19/2017   Procedure: RIGHT HEART CATH;  Surgeon: Cherrie Toribio SAUNDERS, MD;  Location: [facility] INVASIVE CV LAB;  Service: Cardiovascular;  Laterality: N/A;   RIGHT HEART CATH N/A 08/08/2023   Procedure: RIGHT HEART CATH;  Surgeon: Cherrie Toribio SAUNDERS, MD;  Location: [facility] INVASIVE CV LAB;  Service: Cardiovascular;  Laterality: N/A;   RIGHT HEART CATH N/A 09/22/2023   Procedure: RIGHT HEART CATH;  Surgeon: Zenaida Morene PARAS, MD;  Location: [facility] INVASIVE CV LAB;  Service: Cardiovascular;  Laterality: N/A;   RIGHT/LEFT HEART CATH AND CORONARY ANGIOGRAPHY N/A 03/10/2017   Procedure: RIGHT/LEFT HEART CATH AND CORONARY ANGIOGRAPHY;  Surgeon: Cherrie Toribio SAUNDERS, MD;  Location: [facility] INVASIVE CV LAB;  Service: Cardiovascular;  Laterality: N/A;   TEE WITHOUT CARDIOVERSION N/A 04/20/2017   Procedure: TRANSESOPHAGEAL  ECHOCARDIOGRAM (TEE);  Surgeon: Lucas Dorise POUR, MD;  Location: [facility] OR;  Service: Open Heart Surgery;  Laterality: N/A;   TEE WITHOUT CARDIOVERSION N/A 10/17/2018   Procedure: TRANSESOPHAGEAL ECHOCARDIOGRAM (TEE);  Surgeon: Cherrie Toribio SAUNDERS, MD;  Location: [facility] ENDOSCOPY;  Service: Cardiovascular;  Laterality: N/A;   TRANSESOPHAGEAL ECHOCARDIOGRAM (CATH LAB) N/A 05/24/2023   Procedure: TRANSESOPHAGEAL ECHOCARDIOGRAM;  Surgeon: Cindie Ole DASEN, MD;  Location: [facility] INVASIVE CV LAB;  Service: Cardiovascular;  Laterality: N/A;   Social History   Socioeconomic History   Marital status: Married    Spouse name: [name]   Number of children: 3   Years of education: Not on file   Highest education level: Not on file  Occupational History   Not on file  Tobacco Use   Smoking status: Former    Current packs/day: 0.00    Average packs/day: 0.5 packs/day for 43.0 years (21.5 ttl pk-yrs)    Types: Cigarettes, Cigars    Start date: 04/29/1973    Quit date: 04/29/2016    Years since quitting: 7.5   Smokeless tobacco: Never  Vaping Use   Vaping status: Former   Quit date: 04/29/2016  Substance and Sexual Activity   Alcohol  use: No   Drug use: No   Sexual activity: Yes  Other Topics Concern   Not on file  Social History Narrative   Not on file   Social Drivers of Health   Financial Resource Strain: High Risk (07/04/2022)   Overall Financial Resource Strain (CARDIA)    Difficulty of Paying Living Expenses: Very hard  Food Insecurity: No Food Insecurity (09/16/2023)   Hunger Vital Sign    Worried About Running Out of Food in the Last Year: Never true    Ran Out of Food in the Last Year: Never true  Transportation Needs: No Transportation Needs (09/16/2023)   PRAPARE - Administrator, Civil Service (Medical): No    Lack of Transportation (Non-Medical): No  Physical Activity: Inactive (03/10/2017)   Exercise Vital Sign    Days of Exercise per Week: 0 days    Minutes of Exercise per  Session: 0 min  Stress: No Stress Concern Present (03/10/2017)   [employer] of Occupational Health - Occupational Stress Questionnaire    Feeling of Stress : Not at all  Social Connections: Not on file  Intimate Partner Violence: Not At Risk (09/16/2023)   Humiliation, Afraid, Rape, and Kick questionnaire    Fear of Current or Ex-Partner: No    Emotionally Abused: No    Physically Abused: No    Sexually Abused: No   Current Outpatient Medications  on File Prior to Visit  Medication Sig Dispense Refill   acetaminophen  (TYLENOL ) 500 MG tablet Take 1,000 mg by mouth 2 (two) times daily as needed for moderate pain (pain score 4-6), fever or headache.     amiodarone  (PACERONE ) 200 MG tablet Take 1 tablet (200 mg total) by mouth 2 (two) times daily. 180 tablet 3   atorvastatin  (LIPITOR) 40 MG tablet Take 1 tablet (40 mg total) by mouth daily. 90 tablet 3   fluticasone  (FLONASE ) 50 MCG/ACT nasal spray Place 2 sprays into both nostrils daily. 9.9 mL 3   magnesium  oxide (MAG-OX) 400 (240 Mg) MG tablet Take 1 tablet (400 mg total) by mouth 2 (two) times daily. 180 tablet 3   Multiple Vitamins-Minerals (MENS 50+ MULTIVITAMIN) TABS Take 1 tablet by mouth daily.     pantoprazole  (PROTONIX ) 40 MG tablet Take 1 tablet (40 mg total) by mouth daily. 90 tablet 3   potassium chloride  SA (KLOR-CON  M) 20 MEQ tablet Take 1 tablet (20 mEq total) by mouth daily. 90 tablet 3   tamsulosin  (FLOMAX ) 0.4 MG CAPS capsule Take 1 capsule (0.4 mg total) by mouth daily. 90 capsule 3   torsemide  (DEMADEX ) 20 MG tablet Take 1 tablet (20 mg total) by mouth every other day. 45 tablet 3   traZODone  (DESYREL ) 50 MG tablet Take 1 tablet (50 mg total) by mouth at bedtime. 90 tablet 3   warfarin (COUMADIN ) 2 MG tablet TAKE 1 MG daily per VAD Pharm D 30 tablet 6   No current facility-administered medications on file prior to visit.   Allergies  Allergen Reactions   Plavix [Clopidogrel Bisulfate] Hives   Family History   Problem Relation Age of Onset   Coronary artery disease Father    Hypertension Father    Hypertension Sister    Coronary artery disease Brother    Emphysema Brother    Coronary artery disease Mother    Hypertension Mother    Emphysema Mother    Hypertension Daughter    Obesity Daughter    Diabetes Other    Hypertension Other    Coronary artery disease Other    PE: Pulse 70   Ht 5' 5 (1.651 m)   Wt 158 lb 12.8 oz (72 kg)   SpO2 91%   BMI 26.43 kg/m  Wt Readings from Last 3 Encounters:  11/28/23 158 lb 12.8 oz (72 kg)  11/09/23 157 lb 12.8 oz (71.6 kg)  10/02/23 154 lb 3.2 oz (69.9 kg)   Constitutional: normal weight, in NAD Eyes:  EOMI, no exophthalmos ENT: no neck masses, no cervical lymphadenopathy Cardiovascular: Heart sounds intermittently audible, No MRG Respiratory: CTA B Musculoskeletal: no deformities Skin:no rashes Neurological: no tremor with outstretched hands  ASSESSMENT: 1. Thyrotoxicosis  PLAN:  Patient with a recently found undetectable TSH and high free T4 and normal free T3 in the setting of amiodarone  treatment.  He denies thyrotoxic sxs: weight loss, heat intolerance, hyperdefecation, palpitations, anxiety, tremors, loose stools.  -Of note, he has a cardiac history significant for ischemic cardiomyopathy, atrial fibrillation, CHF and has an ICD and an LVAD.  He  is on 200 mg amiodarone  2x daily, dose increased yesterday. -Reviewed previous TFTs and TSH has been normal up to 10/2023.  Interestingly, his free T3 levels have been normal throughout but his free T4 was elevated starting in 2018. - he does not appear to have exogenous causes for the low TSH other than amiodarone .  He did have a severe episode of influenza  A complicated with pneumonia and respiratory distress, which could have caused a transient thyrotoxicosis, but the tests were checked after the episode resolved - We discussed that in the setting of amiodarone  treatment it is possible that  he has: 1. Type 1 AIT: iodine  induced hyperthyroidism >> increase thyroid  hormone production 2. Type 2 AIT: iodine -induced thyroiditis >> increase thyroid  hormone leakage from the inflamed gland However, it is also possible that he may have: Graves ds  (will check TSI antibodies) toxic multinodular goiter/ toxic adenoma (I cannot feel nodules at palpation of his thyroid , in fact, his thyroid  is not palpable at all). Subacute thyroiditis - will re-check the TSH, fT3 and fT4 and also add thyroid  stimulating antibodies to screen for Graves' disease and Hashimoto's thyroiditis.  I will also check an ESR to check for thyroiditis. - We discussed with patient and his wife about other tests that we can check to better delineate etiology.  I explained that when patients are on amiodarone , a thyroid  uptake and scan is usually not helpful, since the thyroid  is already saturated with iodine  - Thyroid  ultrasound to check blood flow could be useful but is not always very helpful - Because it is usually difficult to differentiate between type 1 AIT (treated with MMI) and type 2 AIT (treated with steroids), we usually treat for both at the same time and tapered the medicines as indicated while we monitor TFTs. - After the results are back, if we cannot clearly differentiate between the above conditions, I suggested to add prednisone  (I usually high doses used, but for him I would like to try 10 mg daily) with 5 or 10 mg of methimazole .  We discussed about possible side effects of methimazole : Decreased white blood cell count and increased LFTs.  I explained that these are quite rare, though.  We also discussed that if his condition does not respond to these measures, we may need thyroidectomy. - After we started treatment, we will repeat TFTs in 1 month - wife is wondering whether his condition needs to be treated, since he is asymptomatic.  We discussed about the deleterious side effects of uncontrolled hypertyroidism  on the cardiovascular system and also bone health. - RTC in 3 months, but sooner for repeat labs   Component     Latest Ref Rng 11/28/2023  TSH     0.40 - 4.50 mIU/L 0.01 (L)   Sed Rate     0 - 20 mm/h 19   T4,Free(Direct)     0.8 - 1.8 ng/dL 2.4 (H)   Triiodothyronine,Free,Serum     2.3 - 4.2 pg/mL 3.9   TSI     <140 % baseline 102   Thyroperoxidase Ab SerPl-aCnc     <9 IU/mL 1   Thyroglobulin Ab     < or = 1 IU/mL 1   His TSH improved to be detectable but still very low, while free T3 is still normal.  Free T4 is mildly elevated.  Antithyroid antibodies are not elevated.  However, ESR is also not elevated.  Therefore, this is a mixed picture and the etiology of his thyrotoxicosis is not completely clear. I would suggest to start methimazole  5 mg daily, along with prednisone  10 mg daily and recheck his test in 3 weeks.  Lela Fendt, MD PhD [facility] Endocrinology

## 2023-11-28 NOTE — Patient Instructions (Addendum)
 Please stop at the lab.  Please return in 3 months for another visit, but likely sooner for labs.  Hyperthyroidism Hyperthyroidism refers to a thyroid  gland that is too active or overactive. The thyroid  gland is a small gland located in the lower front part of the neck, just in front of the windpipe (trachea). This gland makes hormones that: Help control how the body uses food for energy (metabolism). Help the heart and brain work well. Keep your bones strong. When the thyroid  is overactive, it produces too much of a hormone called thyroxine. What are the causes? This condition may be caused by: Graves' disease. This is a disorder in which the body's disease-fighting system (immune system) attacks the thyroid  gland. This is the most common cause. Inflammation of the thyroid  gland. A tumor in the thyroid  gland. Use of certain medicines, including: Prescription thyroid  hormone replacement. Herbal supplements that mimic thyroid  hormones. Amiodarone  therapy. Solid or fluid-filled lumps within your thyroid  gland (thyroid  nodules). Taking in a large amount of iodine  from foods or medicines. What increases the risk? You are more likely to develop this condition if: You are male. You have a family history of thyroid  conditions. You smoke tobacco. You use a medicine called lithium. You take medicines that affect the immune system (immunosuppressants). What are the signs or symptoms? Symptoms of this condition include: Nervousness. Inability to tolerate heat. Diarrhea. Rapid heart rate. Shaky hands. Restlessness. Sleep problems. Other symptoms may include: Heart skipping beats or making extra beats. Unexplained weight loss. Change in the texture of hair or skin. Loss of menstruation. Fatigue. Enlarged thyroid  gland or a lump in the thyroid  (nodule). You may also have symptoms of Graves' disease, which may include: Protruding eyes. Dry eyes. Red or swollen eyes. Problems with  vision. How is this diagnosed? This condition may be diagnosed based on: Your symptoms and medical history. A physical exam. Blood tests. Thyroid  ultrasound. This test involves using sound waves to produce images of the thyroid  gland. A thyroid  scan. A radioactive substance is injected into a vein, and images show how much iodine  is present in the thyroid . Radioactive iodine  uptake test (RAIU). A small amount of radioactive iodine  is given by mouth to see how much iodine  the thyroid  absorbs after a certain amount of time. How is this treated? Treatment depends on the cause and severity of the condition. Treatment may include: Medicines to reduce the amount of thyroid  hormone your body makes. Medicines to help manage your symptoms. Radioactive iodine  treatment (radioiodine therapy). This involves swallowing a small dose of radioactive iodine , in capsule or liquid form, to kill thyroid  cells. Surgery to remove part or all of your thyroid  gland. You may need to take thyroid  hormone replacement medicine for the rest of your life after thyroid  surgery. Follow these instructions at home:  Take over-the-counter and prescription medicines only as told by your health care provider. Do not use any products that contain nicotine or tobacco. These products include cigarettes, chewing tobacco, and vaping devices, such as e-cigarettes. If you need help quitting, ask your health care provider. Follow any instructions from your health care provider about diet. You may be instructed to limit foods that contain iodine . Keep all follow-up visits. You will need to have blood tests regularly so that your health care provider can monitor your condition. Where to find more information [employer] of Diabetes and Digestive and Kidney Diseases: [email or web address] Contact a health care provider if: Your symptoms do not get better with treatment. You  have a fever. You have abdominal pain. You feel dizzy. You are  taking thyroid  hormone replacement medicine and: You have symptoms of depression. You feel like you are tired all the time. You gain weight. Get help right away if: You have sudden, unexplained confusion or other mental changes. You have chest pain. You have fast or irregular heartbeats (palpitations). You have difficulty breathing. These symptoms may be an emergency. Get help right away. Call 911. Do not wait to see if the symptoms will go away. Do not drive yourself to the hospital. Summary The thyroid  gland is a small gland located in the lower front part of the neck, just in front of the windpipe. Hyperthyroidism is when the thyroid  gland is too active and produces too much of a hormone called thyroxine. The most common cause is Graves' disease, a disorder in which your immune system attacks the thyroid  gland. Hyperthyroidism can cause various symptoms, such as unexplained weight loss, nervousness, inability to tolerate heat, or changes in your heartbeat. Treatment may include medicine to reduce the amount of thyroid  hormone your body makes, radioiodine therapy, surgery, or medicines to manage symptoms. This information is not intended to replace advice given to you by your health care provider. Make sure you discuss any questions you have with your health care provider. Document Revised: 06/04/2021 Document Reviewed: 06/04/2021 Elsevier Patient Education  2024 [employer].

## 2023-11-29 ENCOUNTER — Ambulatory Visit: Payer: Self-pay | Admitting: Cardiology

## 2023-11-29 NOTE — Progress Notes (Deleted)
  Electrophysiology Office Follow up Visit Note:    Date:  11/29/2023   ID:  David [name], DOB 1966-02-08, MRN [id number]  PCP:  Jackolyn Darice BROCKS, FNP  [facility] Cardiologist:  Toribio Fuel, MD  [facility] Electrophysiologist:  Elspeth Sage, MD    Interval History:     David [name] is a 58 y.o. male who presents for a follow up visit.   I last saw him in January of this year for his lead extraction because of an ICD lead malfunction.  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  He was hospitalized in the spring of this year with severe influenza.  He was admitted in May with respiratory failure thought to be secondary to pneumonia.  He received recent shocks for VT.  He was on amiodarone  at the time.  When he saw Dr. Fuel recently on November 09, 2023 he was off of oxygen.  We received a notice August 4 for a ventricular tachycardia episode that did not respond to ATP and ultimately converted with shock.  Dr. Bensimhon increase the amiodarone  to 200 mg by mouth twice daily and encouraged him to take his torsemide .      Past medical, surgical, social and family history were reviewed.  ROS:   Please see the history of present illness.    All other systems reviewed and are negative.  EKGs/Labs/Other Studies Reviewed:    The following studies were reviewed today:  November 30, 2023 in clinic device interrogation personally reviewed ***        Physical Exam:    VS:  There were no vitals taken for this visit.    Wt Readings from Last 3 Encounters:  11/28/23 158 lb 12.8 oz (72 kg)  11/09/23 157 lb 12.8 oz (71.6 kg)  10/02/23 154 lb 3.2 oz (69.9 kg)     GEN: no distress CARD: ICD pocket well-healed.  Hum of LVAD. RESP: No IWOB. CTAB.      ASSESSMENT:    No diagnosis found. PLAN:    In order of problems listed above:  #Ventricular tachycardia Recent high-voltage  therapy. Continue amiodarone  400 mg by mouth once daily ***Start mexiletine 150 mg by mouth twice daily  #Chronic systolic heart failure #LVAD NYHA class III Follows with Dr. Fuel   Signed, Ole Holts, MD, [facility], [facility] 11/29/2023 8:12 PM    Electrophysiology [facility]

## 2023-11-29 NOTE — Progress Notes (Addendum)
 Population Health CCM Navigation Note Is the patient seen by an affiliated provider?:  Yes Patient identified for Case Management Services by?:  Care Management Why was this patient assigned to this Care Protocol?:  4 Admissions  Does patient qualify for chronic support?:  Yes What type of support?:  Monthly touchbase What is the reason for the referral?:  Utilization patterns  11/29/23 Initial 1 month chronic follow up due to high utilization. HN called patient wife, [name], who is listed on hipaa form, # [phone] and left message to return my phone call.   12/20/23 Initial 1 month chronic follow up due to high utilization x 2nd attempt. HN called patient wife, [name], who is listed on hipaa form, [phone] and spoke with [name].   Situation: HN called patient for high utilization  Background:Patient had 4 admissions in the last year.  Patient following with cardiologist on 11/27/23 and seen Endocrinologist on 11/28/23  Assessment: HN spoke with patient's [name], who is listed on HIPAA form.  [name] reports that they had to shock patient heart again on Monday 8/25.  She reports that he was tired that day and the next day but seems to be doing pretty good. They have made adjustments on his amiodarone  and also had to put him on thyroid  medicine due to the amiodarone  affecting his thyroid .  She denies patient is having any chest pain or shortness of breath.  He is eating and drinking good and has follow up with cardiologist next month. No other issues or concerns at this time.  HN will follow up with patient over the next few months due to high utilization.  HN notified [name] to call PCP office if any new issues or concerns.  HN will follow up in the next 4 to 6 weeks.   Recommendation: HN notified [name] to call PCP office if any new issues or concerns.  General Assessment     Type of Visit Telephone   Assessment Completed With Spouse or significant other * spoke to wife, [name],  who is listed on hipaa form   Interpreter Used Yes   Preferred Language English   Living Arrangement Spouse   Support System Spouse      Jon Sharps, RN [place] Navigator [phone]   Electronically signed by: Jon Earnie Sharps, RN 12/20/2023 1:45 PM  *Some images could not be shown.

## 2023-11-30 ENCOUNTER — Ambulatory Visit: Payer: Self-pay | Admitting: Internal Medicine

## 2023-11-30 ENCOUNTER — Ambulatory Visit: Admitting: Cardiology

## 2023-11-30 DIAGNOSIS — Z95811 Presence of heart assist device: Secondary | ICD-10-CM

## 2023-11-30 DIAGNOSIS — Z79899 Other long term (current) drug therapy: Secondary | ICD-10-CM

## 2023-11-30 DIAGNOSIS — I5022 Chronic systolic (congestive) heart failure: Secondary | ICD-10-CM

## 2023-11-30 DIAGNOSIS — I472 Ventricular tachycardia, unspecified: Secondary | ICD-10-CM

## 2023-11-30 LAB — SEDIMENTATION RATE: Sed Rate: 19 mm/h (ref 0–20)

## 2023-11-30 LAB — T3, FREE: T3, Free: 3.9 pg/mL (ref 2.3–4.2)

## 2023-11-30 LAB — TSH: TSH: 0.01 m[IU]/L — ABNORMAL LOW (ref 0.40–4.50)

## 2023-11-30 LAB — THYROID PEROXIDASE ANTIBODY: Thyroperoxidase Ab SerPl-aCnc: 1 [IU]/mL (ref <9)

## 2023-11-30 LAB — THYROID STIMULATING IMMUNOGLOBULIN: TSI: 102 %{baseline} (ref <140)

## 2023-11-30 LAB — T4, FREE: Free T4: 2.4 ng/dL — ABNORMAL HIGH (ref 0.8–1.8)

## 2023-11-30 LAB — THYROGLOBULIN ANTIBODY: Thyroglobulin Ab: 1 [IU]/mL (ref <=1)

## 2023-11-30 MED ORDER — METHIMAZOLE 5 MG PO TABS: 5.0000 mg | ORAL_TABLET | Freq: Every day | ORAL | 2 refills | Status: DC

## 2023-11-30 MED ORDER — PREDNISONE 10 MG PO TABS: 10.0000 mg | ORAL_TABLET | Freq: Every day | ORAL | 2 refills | Status: DC

## 2023-11-30 NOTE — Addendum Note (Signed)
 Addended by: TRIXIE FILE on: 11/30/2023 04:29 PM   Modules accepted: Orders

## 2023-12-04 ENCOUNTER — Ambulatory Visit (HOSPITAL_COMMUNITY): Payer: Self-pay | Admitting: Pharmacist

## 2023-12-04 LAB — POCT INR: INR: 1.9 — AB (ref 2.0–3.0)

## 2023-12-06 NOTE — Progress Notes (Signed)
  Electrophysiology Office Follow up Visit Note:    Date:  12/07/2023   ID:  David [name], DOB [birth year], MRN [id number]  PCP:  Jackolyn Darice BROCKS, FNP  [facility] Cardiologist:  Toribio Fuel, MD  [facility] Electrophysiologist:  Elspeth Sage, MD    Interval History:     David [name] is a 58 y.o. male who presents for a follow up visit.   I last saw him in January of this year for his lead extraction because of an ICD lead malfunction.  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  He was hospitalized in the spring of this year with severe influenza.  He was admitted in May with respiratory failure thought to be secondary to pneumonia.  He received recent shocks for VT.  He was on amiodarone  at the time.  When he saw Dr. Fuel recently on November 09, 2023 he was off of oxygen.  We received a notice August 4 for a ventricular tachycardia episode that did not respond to ATP and ultimately converted with shock.  Dr. Bensimhon increase the amiodarone  to 200 mg by mouth twice daily and encouraged him to take his torsemide .  He is with his wife today.  She has many questions about the severity of his illness.      Past medical, surgical, social and family history were reviewed.  ROS:   Please see the history of present illness.    All other systems reviewed and are negative.  EKGs/Labs/Other Studies Reviewed:    The following studies were reviewed today:  November 30, 2023 in clinic device interrogation personally reviewed Battery longevity 11 years Lead parameter stable 4 VT episodes since Aug 25, 2023.  Appropriate therapy delivered including high-voltage shock.  ATP was not successful. Increased number of pulses to 12 from 8 during ATP schemes        Physical Exam:    VS:  Pulse 79   Ht 5' 5 (1.651 m)   Wt 157 lb (71.2 kg)   SpO2 93%   BMI 26.13 kg/m     Wt Readings from Last 3  Encounters:  12/07/23 157 lb (71.2 kg)  11/28/23 158 lb 12.8 oz (72 kg)  11/09/23 157 lb 12.8 oz (71.6 kg)     GEN: no distress CARD: ICD pocket well-healed.  Hum of LVAD. RESP: No IWOB. CTAB.      ASSESSMENT:    1. LVAD (left ventricular assist device) present (HCC)   2. VT (ventricular tachycardia) (HCC)   3. Chronic systolic heart failure (HCC)   4. 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 Continue amiodarone  400 mg by mouth once daily Start mexiletine 150 mg by mouth twice daily  #Chronic systolic heart failure #LVAD NYHA class III Follows with Dr. Fuel  Follow-up 6 months with APP  Signed, Ole Holts, MD, [facility], [facility] 12/07/2023 10:23 AM    Electrophysiology [facility]

## 2023-12-07 ENCOUNTER — Telehealth: Payer: Self-pay | Admitting: Pharmacy Technician

## 2023-12-07 ENCOUNTER — Ambulatory Visit: Attending: Cardiology | Admitting: Cardiology

## 2023-12-07 ENCOUNTER — Other Ambulatory Visit (HOSPITAL_COMMUNITY): Payer: Self-pay

## 2023-12-07 ENCOUNTER — Encounter: Payer: Self-pay | Admitting: Cardiology

## 2023-12-07 VITALS — HR 79 | Ht 65.0 in | Wt 157.0 lb

## 2023-12-07 DIAGNOSIS — I472 Ventricular tachycardia, unspecified: Secondary | ICD-10-CM

## 2023-12-07 DIAGNOSIS — Z95811 Presence of heart assist device: Secondary | ICD-10-CM | POA: Diagnosis not present

## 2023-12-07 DIAGNOSIS — Z95810 Presence of automatic (implantable) cardiac defibrillator: Secondary | ICD-10-CM | POA: Diagnosis not present

## 2023-12-07 DIAGNOSIS — I5022 Chronic systolic (congestive) heart failure: Secondary | ICD-10-CM | POA: Diagnosis not present

## 2023-12-07 MED ORDER — MEXILETINE HCL 150 MG PO CAPS
150.0000 mg | ORAL_CAPSULE | Freq: Two times a day (BID) | ORAL | 3 refills | Status: AC
Start: 2023-12-07 — End: ?

## 2023-12-07 NOTE — Telephone Encounter (Signed)
 Pharmacy Patient Advocate Encounter  Received notification from [employer] that Prior Authorization for Mexiletine has been APPROVED from 04/26/23 to 04/24/24. Unable to obtain price due to refill too soon rejection, last fill date 11/27/23 next available fill date09/04/25   PA #/Case ID/Reference #: [id number]

## 2023-12-07 NOTE — Telephone Encounter (Signed)
 Pharmacy Patient Advocate Encounter   Received notification from Fax that prior authorization for Mexiletine 150 MG is required/requested.   Insurance verification completed.   The patient is insured through [employer] .   Per test claim: PA required; PA submitted to above mentioned insurance via Latent Key/confirmation #/EOC [id number] Status is pending

## 2023-12-07 NOTE — Patient Instructions (Signed)
 Medication Instructions:  Start Mexiletine 150 mg take one tablet twice daily *If you need a refill on your cardiac medications before your next appointment, please call your pharmacy*   Follow-Up: At [facility], you and your health needs are our priority.  As part of our continuing mission to provide you with exceptional heart care, our providers are all part of one team.  This team includes your primary Cardiologist (physician) and Advanced Practice Providers or APPs (Physician Assistants and Nurse Practitioners) who all work together to provide you with the care you need, when you need it.  Your next appointment:   6 month(s)  Provider:   You will see one of the following Advanced Practice Providers on your designated Care Team:   Charlies Arthur, PA-C Michael Andy Tillery, PA-C Suzann Riddle, NP Daphne Barrack, NP      We recommend signing up for the patient portal called MyChart.  Sign up information is provided on this After Visit Summary.  MyChart is used to connect with patients for Virtual Visits (Telemedicine).  Patients are able to view lab/test results, encounter notes, upcoming appointments, etc.  Non-urgent messages can be sent to your provider as well.   To learn more about what you can do with MyChart, go to [email or web address].

## 2023-12-08 ENCOUNTER — Telehealth: Payer: Self-pay | Admitting: Cardiology

## 2023-12-08 NOTE — Telephone Encounter (Signed)
 Pt c/o medication issue:  1. Name of Medication:   mexiletine (MEXITIL) 150 MG capsule   2. How are you currently taking this medication (dosage and times per day)?   3. Are you having a reaction (difficulty breathing--STAT)?   4. What is your medication issue?   Caller Elray) stated patient has been prescribed amiodarone  (PACERONE ) 200 MG tablet by another provider and wants to confirm patient's medication.

## 2023-12-08 NOTE — Telephone Encounter (Signed)
 PLAN:     In order of problems listed above:   #Ventricular tachycardia Recent high-voltage therapy. Continue amiodarone  400 mg by mouth once daily Start mexiletine 150 mg by mouth twice daily  (From office visit 8/14)   Spoke with Darice- given the information above. Confirmed medications.

## 2023-12-11 ENCOUNTER — Ambulatory Visit (HOSPITAL_COMMUNITY): Payer: Self-pay | Admitting: Pharmacist

## 2023-12-11 LAB — POCT INR: INR: 1.9 — AB (ref 2.0–3.0)

## 2023-12-18 ENCOUNTER — Telehealth: Payer: Self-pay | Admitting: *Deleted

## 2023-12-18 ENCOUNTER — Telehealth: Payer: Self-pay

## 2023-12-18 ENCOUNTER — Other Ambulatory Visit (HOSPITAL_COMMUNITY): Payer: Self-pay | Admitting: *Deleted

## 2023-12-18 ENCOUNTER — Ambulatory Visit (HOSPITAL_COMMUNITY): Payer: Self-pay | Admitting: Pharmacist

## 2023-12-18 DIAGNOSIS — Z95811 Presence of heart assist device: Secondary | ICD-10-CM

## 2023-12-18 DIAGNOSIS — I480 Paroxysmal atrial fibrillation: Secondary | ICD-10-CM

## 2023-12-18 LAB — POCT INR: INR: 1.9 — AB (ref 2.0–3.0)

## 2023-12-18 MED ORDER — AMIODARONE HCL 200 MG PO TABS
400.0000 mg | ORAL_TABLET | Freq: Two times a day (BID) | ORAL | 3 refills | Status: DC
Start: 2023-12-18 — End: 2023-12-18

## 2023-12-18 MED ORDER — AMIODARONE HCL 200 MG PO TABS: 400.0000 mg | ORAL_TABLET | Freq: Two times a day (BID) | ORAL | 11 refills | Status: DC

## 2023-12-18 NOTE — Telephone Encounter (Signed)
 Remote transmission received for 6 failed ATP & 1 successful shock. Presenting rhythm appears AT 170's. Does appear morphology change which is suggestive of VT. Optivol appears WNL. Routing to staff above as requested.   Please send  back to device so we can see what we need to follow up with.

## 2023-12-18 NOTE — Telephone Encounter (Signed)
 Received page from pt's wife [name] at 0930 this morning reporting pt was shocked by his defibrillator x 1. At time of the shock pt was standing in line at the pharmacy picking up medications. Asymptomatic prior to shock. [name] reports pt had an episode of heart burn and dizziness yesterday, but none today.   Currently taking K 20 meq daily, Amiodarone  200 mg BID, MagOxide 400 mg BID, and Mexiletine 150 mg BID.   Remote report shows pt was shocked x 1 for vfib. Discussed the above with Dr Cherrie. Order received to increase Amiodarone  to 400 mg BID. Updated prescription sent to pt's pharmacy.   Spoke with [name] about the above medication change. She verbalized understanding.   Isaiah Knoll RN VAD Coordinator  Office: [phone]  24/7 Pager: [phone]

## 2023-12-27 ENCOUNTER — Ambulatory Visit (HOSPITAL_COMMUNITY): Payer: Self-pay | Admitting: Pharmacist

## 2023-12-27 LAB — POCT INR: INR: 2.5 (ref 2.0–3.0)

## 2024-01-01 ENCOUNTER — Ambulatory Visit (HOSPITAL_COMMUNITY): Payer: Self-pay | Admitting: Pharmacist

## 2024-01-01 LAB — POCT INR: INR: 1.6 — AB (ref 2.0–3.0)

## 2024-01-02 ENCOUNTER — Telehealth (HOSPITAL_COMMUNITY): Payer: Self-pay | Admitting: *Deleted

## 2024-01-02 NOTE — Telephone Encounter (Signed)
 Received call from [facility] reporting pt c/o increased fatigue, and occasional flinching uncontrolled spastic hand movements. States the hand movements started after his stroke, but seem to happen more frequently since starting Mexiletine. On Prednisone  and Methimazole  for thyroid  per Dr Trixie. Taking all medications as prescribed. Advised to notify VAD coordinator if symptoms worsen.   Updated Dr Bensimhon on the above.   Isaiah Knoll RN VAD Coordinator  Office: [phone]  24/7 Pager: [phone]

## 2024-01-08 ENCOUNTER — Ambulatory Visit (HOSPITAL_COMMUNITY): Payer: Self-pay | Admitting: Pharmacist

## 2024-01-08 LAB — POCT INR: INR: 1.8 — AB (ref 2.0–3.0)

## 2024-01-09 ENCOUNTER — Other Ambulatory Visit (HOSPITAL_COMMUNITY): Payer: Self-pay

## 2024-01-09 DIAGNOSIS — Z7901 Long term (current) use of anticoagulants: Secondary | ICD-10-CM

## 2024-01-09 DIAGNOSIS — Z95811 Presence of heart assist device: Secondary | ICD-10-CM

## 2024-01-10 ENCOUNTER — Encounter (HOSPITAL_COMMUNITY): Admitting: Internal Medicine

## 2024-01-11 ENCOUNTER — Encounter (HOSPITAL_COMMUNITY): Admitting: Internal Medicine

## 2024-01-12 ENCOUNTER — Ambulatory Visit (HOSPITAL_COMMUNITY)
Admission: RE | Admit: 2024-01-12 | Discharge: 2024-01-12 | Disposition: A | Discharge status: Home / Self Care | Source: Ambulatory Visit | Attending: Internal Medicine | Admitting: Internal Medicine

## 2024-01-12 VITALS — BP 99/86 | HR 78 | Wt 163.2 lb

## 2024-01-12 DIAGNOSIS — Z7901 Long term (current) use of anticoagulants: Secondary | ICD-10-CM | POA: Insufficient documentation

## 2024-01-12 DIAGNOSIS — Z95811 Presence of heart assist device: Secondary | ICD-10-CM | POA: Diagnosis present

## 2024-01-12 DIAGNOSIS — I472 Ventricular tachycardia, unspecified: Secondary | ICD-10-CM

## 2024-01-12 DIAGNOSIS — J9601 Acute respiratory failure with hypoxia: Secondary | ICD-10-CM

## 2024-01-12 DIAGNOSIS — I480 Paroxysmal atrial fibrillation: Secondary | ICD-10-CM | POA: Diagnosis not present

## 2024-01-12 DIAGNOSIS — I5022 Chronic systolic (congestive) heart failure: Secondary | ICD-10-CM

## 2024-01-12 DIAGNOSIS — I2510 Atherosclerotic heart disease of native coronary artery without angina pectoris: Secondary | ICD-10-CM

## 2024-01-12 LAB — CBC
HCT: 47.4 % (ref 39.0–52.0)
Hemoglobin: 14.4 g/dL (ref 13.0–17.0)
MCH: 24 pg — ABNORMAL LOW (ref 26.0–34.0)
MCHC: 30.4 g/dL (ref 30.0–36.0)
MCV: 79.1 fL — ABNORMAL LOW (ref 80.0–100.0)
Platelets: 209 K/uL (ref 150–400)
RBC: 5.99 MIL/uL — ABNORMAL HIGH (ref 4.22–5.81)
RDW: 16.2 % — ABNORMAL HIGH (ref 11.5–15.5)
WBC: 15.6 K/uL — ABNORMAL HIGH (ref 4.0–10.5)
nRBC: 0 % (ref 0.0–0.2)

## 2024-01-12 LAB — COMPREHENSIVE METABOLIC PANEL WITH GFR
ALT: 35 U/L (ref 0–44)
AST: 32 U/L (ref 15–41)
Albumin: 2.8 g/dL — ABNORMAL LOW (ref 3.5–5.0)
Alkaline Phosphatase: 99 U/L (ref 38–126)
Anion gap: 7 (ref 5–15)
BUN: 17 mg/dL (ref 6–20)
CO2: 25 mmol/L (ref 22–32)
Calcium: 9.1 mg/dL (ref 8.9–10.3)
Chloride: 108 mmol/L (ref 98–111)
Creatinine, Ser: 1.86 mg/dL — ABNORMAL HIGH (ref 0.61–1.24)
GFR, Estimated: 41 mL/min — ABNORMAL LOW (ref >60)
Glucose, Bld: 100 mg/dL — ABNORMAL HIGH (ref 70–99)
Potassium: 4.5 mmol/L (ref 3.5–5.1)
Sodium: 140 mmol/L (ref 135–145)
Total Bilirubin: 0.5 mg/dL (ref 0.0–1.2)
Total Protein: 7.1 g/dL (ref 6.5–8.1)

## 2024-01-12 LAB — MAGNESIUM: Magnesium: 2 mg/dL (ref 1.7–2.4)

## 2024-01-12 LAB — PROTIME-INR
INR: 1.7 — ABNORMAL HIGH (ref 0.8–1.2)
Prothrombin Time: 20.6 s — ABNORMAL HIGH (ref 11.4–15.2)

## 2024-01-12 LAB — LACTATE DEHYDROGENASE: LDH: 194 U/L — ABNORMAL HIGH (ref 98–192)

## 2024-01-12 MED ORDER — AMIODARONE HCL 200 MG PO TABS: 200.0000 mg | ORAL_TABLET | Freq: Two times a day (BID) | ORAL | 11 refills | Status: DC

## 2024-01-12 NOTE — Patient Instructions (Addendum)
 Decrease Amiodarone  to 200 mg twice daily Coumadin  dosing per Lauren PharmD Return to VAD clinic in 2 months for follow up with Dr Bensimhon

## 2024-01-12 NOTE — Progress Notes (Addendum)
 Patient presents for 2 month f/u in VAD Clinic today with Marceil) wife . Denies issues with VAD equipment or drive line.   Pt walked into clinic independently. Pt states he has been off oxygen for about a month now. States he is feeling good. Denies lightheadedness dizziness, shortness of breath, heart failure symptoms, and signs of bleeding.   Last shock 8/25. Amiodarone  increased to 400 mg BID at that time. Per Dr Cherrie will decrease Amiodarone  to 200 mg BID today. Updated prescription sent to pt's pharmacy. Pt & wife verbalized understanding of medication change. Will obtain EKG today per Dr Cherrie- SR 1st degree AV block.  Repeat CT chest completed 11/17/23. Dr Cherrie discussed results with pt & wife.   WBC 15.6 today (up from 12.5). Denies fever, chills, burning/discomfort with urination, GI symptoms, and productive cough. Drive line is pristine.   Vital Signs: Doppler Pressure: 84 Automatic BP: 99/86 (93) HR: 78 SPO2: 94% RA    Weight: 163.2 lb w/ eqt Last weight: 157.8 lb w/ eqt  VAD Indication: Destination therapy- CKD   VAD interrogation & Equipment Management: Speed: 5600 Flow: 4.8 Power: 4.5 w    PI: 4.7   Alarms: none Events: 20 - 40 (on lasix  days), otherwise rare  Fixed speed 5600 Low speed limit: 5300   Primary Controller: Replace back up battery in 29 months Back up controller: Replace back up battery in 26 months   Annual Equipment Maintenance on UBC/PM was performed on 03/27/2023.   I reviewed the LVAD parameters from today and compared the results to the patient's prior recorded data. LVAD interrogation was NEGATIVE for significant power changes, negative for clinical alarms and STABLE for PI events/speed drops. No programming changes were made and pump is functioning within specified parameters. Pt is performing daily controller and system monitor self tests along with completing weekly and monthly maintenance for LVAD equipment.   LVAD equipment  check completed and is in good working order. Back up equipment present at this appointment.   Exit Site Care: Drive line is being maintained weekly by [name], his wife. Exit site healed and incorporated, the velour is fully implanted at exit site. No redness, drainage, rash, or foul odor noted. Pt denies fever or chills. Provided patient with 8 weekly dressing kits, 8 anchors, and a box of adhesive remover for home use.  Device: [employer] Bi-V Therapies: VF 250 VT 167-300 Pacing: DDD 60 Last check: 11/21/23  BP & Labs:  Doppler BP 84 - Doppler is reflecting MAP  Hgb 14.4 - No S/S of bleeding. Specifically denies melena/BRBPR or nosebleeds.  LDH 194 with established baseline of 165- 250. Denies tea-colored urine. No power elevations noted on interrogation.    Patient Instructions:  Decrease Amiodarone  to 200 mg twice daily Coumadin  dosing per Lauren PharmD Return to VAD clinic in 2 months for follow up with Dr Cherrie  Isaiah Knoll RN VAD Coordinator  Office: [phone]  24/7 Pager: [phone]

## 2024-01-15 ENCOUNTER — Ambulatory Visit (HOSPITAL_COMMUNITY): Payer: Self-pay | Admitting: Pharmacist

## 2024-01-15 LAB — POCT INR: INR: 1.8 — AB (ref 2.0–3.0)

## 2024-01-22 ENCOUNTER — Ambulatory Visit (HOSPITAL_COMMUNITY): Payer: Self-pay | Admitting: Pharmacist

## 2024-01-22 LAB — POCT INR: INR: 1.6 — AB (ref 2.0–3.0)

## 2024-01-24 NOTE — Progress Notes (Signed)
 Remote ICD Transmission

## 2024-01-29 ENCOUNTER — Ambulatory Visit (HOSPITAL_COMMUNITY): Payer: Self-pay | Admitting: Pharmacist

## 2024-01-29 LAB — POCT INR: INR: 1.5 — AB (ref 2.0–3.0)

## 2024-02-05 ENCOUNTER — Ambulatory Visit (HOSPITAL_COMMUNITY): Payer: Self-pay | Admitting: Pharmacist

## 2024-02-05 DIAGNOSIS — Z95811 Presence of heart assist device: Secondary | ICD-10-CM

## 2024-02-05 LAB — POCT INR: INR: 1.7 — AB (ref 2.0–3.0)

## 2024-02-12 ENCOUNTER — Ambulatory Visit (HOSPITAL_COMMUNITY): Payer: Self-pay | Admitting: Pharmacist

## 2024-02-12 DIAGNOSIS — Z95811 Presence of heart assist device: Secondary | ICD-10-CM

## 2024-02-12 LAB — POCT INR: INR: 1.6 — AB (ref 2.0–3.0)

## 2024-02-19 ENCOUNTER — Other Ambulatory Visit (HOSPITAL_COMMUNITY): Payer: Self-pay | Admitting: *Deleted

## 2024-02-19 ENCOUNTER — Telehealth (HOSPITAL_COMMUNITY): Payer: Self-pay | Admitting: *Deleted

## 2024-02-19 LAB — POCT INR: INR: 2 (ref 2.0–3.0)

## 2024-02-19 NOTE — Telephone Encounter (Signed)
 David [name] called reporting pt having issues with cough/drainage related to his allergies. Denies fever/chills/or feeling poorly. Denies sick contacts. He is taking Benadryl  at bedtime. Provided list of cough/cold medications pt may take. Advised they notify VAD coordinators if symptoms worsen or he feels poorly. She verbalized understanding.   Isaiah Knoll RN VAD Coordinator  Office: [phone]  24/7 Pager: [phone]

## 2024-02-20 ENCOUNTER — Ambulatory Visit (HOSPITAL_COMMUNITY): Payer: Self-pay | Admitting: Pharmacist

## 2024-02-20 DIAGNOSIS — Z95811 Presence of heart assist device: Secondary | ICD-10-CM

## 2024-02-21 ENCOUNTER — Ambulatory Visit (INDEPENDENT_AMBULATORY_CARE_PROVIDER_SITE_OTHER)

## 2024-02-21 DIAGNOSIS — I255 Ischemic cardiomyopathy: Secondary | ICD-10-CM | POA: Diagnosis not present

## 2024-02-22 ENCOUNTER — Ambulatory Visit: Payer: Self-pay | Admitting: Cardiology

## 2024-02-22 LAB — CUP PACEART REMOTE DEVICE CHECK
Battery Remaining Longevity: 133 mo
Battery Voltage: 3.02 V
Brady Statistic RV Percent Paced: 14.91 %
Date Time Interrogation Session: 20251029055726
HighPow Impedance: 66 Ohm
Implantable Lead Connection Status: 753985
Implantable Lead Connection Status: 753985
Implantable Lead Connection Status: 753985
Implantable Lead Connection Status: 753985
Implantable Lead Implant Date: 20180409
Implantable Lead Implant Date: 20180716
Implantable Lead Implant Date: 20180716
Implantable Lead Implant Date: 20250129
Implantable Lead Location: 753858
Implantable Lead Location: 753858
Implantable Lead Location: 753859
Implantable Lead Location: 753860
Implantable Lead Model: 5076
Implantable Lead Model: 511212
Implantable Lead Model: 511212
Implantable Lead Serial Number: 263773
Implantable Lead Serial Number: 263775
Implantable Pulse Generator Implant Date: 20250129
Lead Channel Impedance Value: 304 Ohm
Lead Channel Impedance Value: 342 Ohm
Lead Channel Impedance Value: 380 Ohm
Lead Channel Pacing Threshold Amplitude: 0.625 V
Lead Channel Pacing Threshold Amplitude: 0.625 V
Lead Channel Pacing Threshold Pulse Width: 0.4 ms
Lead Channel Pacing Threshold Pulse Width: 0.4 ms
Lead Channel Sensing Intrinsic Amplitude: 3 mV
Lead Channel Sensing Intrinsic Amplitude: 9.5 mV
Lead Channel Setting Pacing Amplitude: 1.5 V
Lead Channel Setting Pacing Amplitude: 2 V
Lead Channel Setting Pacing Pulse Width: 0.4 ms
Lead Channel Setting Sensing Sensitivity: 0.3 mV
Zone Setting Status: 755011
Zone Setting Status: 755011

## 2024-02-28 ENCOUNTER — Other Ambulatory Visit (HOSPITAL_COMMUNITY): Payer: Self-pay | Admitting: Unknown Physician Specialty

## 2024-02-28 ENCOUNTER — Ambulatory Visit (HOSPITAL_COMMUNITY): Payer: Self-pay | Admitting: Pharmacist

## 2024-02-28 DIAGNOSIS — Z95811 Presence of heart assist device: Secondary | ICD-10-CM

## 2024-02-28 DIAGNOSIS — Z7901 Long term (current) use of anticoagulants: Secondary | ICD-10-CM

## 2024-02-28 LAB — POCT INR: INR: 1.9 — AB (ref 2.0–3.0)

## 2024-02-28 NOTE — Addendum Note (Signed)
 Encounter addended by: Silva Aamodt R, MD on: 02/28/2024 7:54 PM  Actions taken: Clinical Note Signed, Level of Service modified, Visit diagnoses modified, Charge Capture section accepted

## 2024-02-28 NOTE — Progress Notes (Signed)
 LVAD Clinic Follow Up Note HF MD: Dr Cherrie  HPI: David [name] is a 58 y.o. male with history of ICD (09 PCI to LAD, mRCA, '10 PCI to Lcx and '12 mRCA), HFrEF (EF ~20% for > 5 years), HLD and HTN. S/P LVAD HM-III 04/20/18.    Admitted 04/19/17 - 05/27/2017 for scheduled LVAD implant. On 12/27 he underwent HMIII LVAD implant. Hospital course c/b PNA shock liver and AKI. Required short term HD.  Readmitted 05/30/17 hgb 3.8. INR 2.9.transfused 4u pRBCs. 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 Underwent ventriculostomy. Admission c/b by periods of confusion and urinary retention. Discharged to CIR  Was seen for acute visit on 03/27/23 for ICD firing x 2 on Thanksgiving.. Electrolytes ok. Seen by Dr. Fernande and ICD reprogrammed   Admitted on 07/10/23 for respiratory distress in setting of Influenza A infection.  Readmitted with 08/03/23 with recurrent respiratory distress and hypoxia. Felt to be combination of HF and possible residual infection. Diuresed. Underwent RHC which showed well optimized hemodynamics. VAD speed increased to 5600. Had persistent hypoxia despite abx and diuresis. Concern for amio toxicity but ESR was low. Hi-res CT had airspace disease and atx. Also increased mediastinal lymphadenopathy, Seen by Dr. Claudene in Pulmonary. Recommended pulmonary toilet and home O2  Admitted from 5/22-5/31/25 with hypoxic respiratory failure requiring intubation. Treated with abx for possible PNA; aggressively diuresed and treated with steroids for amio toxicity (although this was later felt to be unlikely). He was diuresed and speeds were adjusted with RHC prior to discharge demonstrating adequate hemodynamics.   Seen in clinic 10/02/23 with VT at home. Device interrogation demonstrated multiple attempts at ATP followed by shock x 1 appropriately delivered  for VT. (Was taking amio 200 daily at time). Mg was low and supped  Here for f/u with his wife. Says he feels good. Off O2 for 1 month now. Does all activities without limitation. No edema. Denies orthopnea or PND. No fevers, chills or problems with driveline. No bleeding, melena or neuro symptoms. No VAD alarms. Taking all meds as prescribed.     VAD Indication: Destination therapy- CKD   VAD interrogation & Equipment Management: Speed: 5600 Flow: 4.8 Power: 4.5 w    PI: 4.7   Alarms: none Events: 20 - 40 (on lasix  days), otherwise rare  Fixed speed 5600 Low speed limit: 5300   Primary Controller: Replace back up battery in 29 months Back up controller: Replace back up battery in 26 months   Annual Equipment Maintenance on UBC/PM was performed on 03/27/2023.   Past Medical History:  Diagnosis Date   AICD (automatic cardioverter/defibrillator) present    Anemia    Anxiety    CAD (coronary artery disease) 2009   AMI in 12/2007 with PCI to LAD, staged PCI to  mid/distal RCA, NSTEMI in 02/2009 with BMS to LCx   CHF (congestive heart failure) (HCC)    Chronic kidney disease 11/03/2016   stage 3 kidney disease   Dysrhythmia    arrythmia problems at some point, fatal rhythms   History of blood transfusion    I was bleeding from was rectum (04/19/2017)   HLD (hyperlipidemia)    HTN (hypertension)    Hyperlipidemia 03/15/2011   Ischemic cardiomyopathy    Admitted in 07/2010 with CHF exacerbation   Myocardial infarction (HCC)    I've had  4 (04/19/2017)   Pneumonia 2018 X 1   Seizures (HCC)    one seizure in 04/2016 during cardiac event (04/19/2017)   STEMI 2009 (anterior), 2010 (lateral), and 2012 (inferior) 03/14/2011    Current Outpatient Medications  Medication Sig Dispense Refill   acetaminophen  (TYLENOL ) 500 MG tablet Take 1,000 mg by mouth 2 (two) times daily as needed for moderate pain (pain score 4-6), fever or headache.     atorvastatin  (LIPITOR) 40 MG  tablet Take 1 tablet (40 mg total) by mouth daily. 90 tablet 3   fluticasone  (FLONASE ) 50 MCG/ACT nasal spray Place 2 sprays into both nostrils daily. 9.9 mL 3   magnesium  oxide (MAG-OX) 400 (240 Mg) MG tablet Take 1 tablet (400 mg total) by mouth 2 (two) times daily. 180 tablet 3   methimazole  (TAPAZOLE ) 5 MG tablet Take 1 tablet (5 mg total) by mouth daily. With a meal. 30 tablet 2   mexiletine (MEXITIL) 150 MG capsule Take 1 capsule (150 mg total) by mouth 2 (two) times daily. 180 capsule 3   Multiple Vitamins-Minerals (MENS 50+ MULTIVITAMIN) TABS Take 1 tablet by mouth daily.     pantoprazole  (PROTONIX ) 40 MG tablet Take 1 tablet (40 mg total) by mouth daily. 90 tablet 3   potassium chloride  SA (KLOR-CON  M) 20 MEQ tablet Take 1 tablet (20 mEq total) by mouth daily. 90 tablet 3   predniSONE  (DELTASONE ) 10 MG tablet Take 1 tablet (10 mg total) by mouth daily with breakfast. 30 tablet 2   tamsulosin  (FLOMAX ) 0.4 MG CAPS capsule Take 1 capsule (0.4 mg total) by mouth daily. 90 capsule 3   torsemide  (DEMADEX ) 20 MG tablet Take 1 tablet (20 mg total) by mouth every other day. 45 tablet 3   traZODone  (DESYREL ) 50 MG tablet Take 1 tablet (50 mg total) by mouth at bedtime. 90 tablet 3   warfarin (COUMADIN ) 2 MG tablet TAKE 1 MG daily per VAD Pharm D 30 tablet 6   amiodarone  (PACERONE ) 200 MG tablet Take 1 tablet (200 mg total) by mouth 2 (two) times daily. 120 tablet 11   No current facility-administered medications for this encounter.    Plavix [clopidogrel bisulfate]  Review of systems complete and found to be negative unless listed in HPI.       Wt Readings from Last 3 Encounters:  01/12/24 74 kg (163 lb 3.2 oz)  12/07/23 71.2 kg (157 lb)  11/28/23 72 kg (158 lb 12.8 oz)     Vital Signs: Doppler Pressure: 84 Automatic BP: 99/86 (93) HR: 78 SPO2: 94% RA    Weight: 163.2 lb w/ eqt Last weight: 157.8 lb w/ eqt      Physical Exam: Vitals:   01/12/24 0912 01/12/24 0913  BP: (!)  84/0 99/86  Pulse: 78   SpO2: 94%    General:  NAD.  HEENT: normal  Neck: supple. JVP not elevated.  Carotids 2+ bilat; no bruits. No lymphadenopathy or thryomegaly appreciated. Cor: LVAD hum.  Lungs: Clear. Abdomen: soft, nontender, non-distended. No hepatosplenomegaly. No bruits or masses. Good bowel sounds. Driveline site clean. Anchor in place.  Extremities: no cyanosis, clubbing, rash. Warm no edema  Neuro: alert & oriented x 3. No focal deficits. Moves all 4 without problem    ASSESSMENT AND PLAN:  1. Acute hypoxic respiratory failure - resolved - Admitted 5/25 with acute resp failure. Combination of volume overload & possible PNA and post-viral syndrome. Not felt to have amio toxicity - Much improved  - Chest  CT improved. Reviewed with him and his wife. Does have underlying COPD  2. Chronic systolic CHF with HM III LVAD implant 04/20/17:  - Doing well NYHA II - Volume ok. Continue torsemide  20 daily. Take extra as needed - Echo in 6/25 TTE  IVS midline; mild-mod reduced RV function (stable), IVC 1.5cm.   3. Ventricular tachycardia - Had ICD shock x 2 for VT on 03/23/23 (device reprogrammed at the time) - Recurrent VT with appropriate shock after failed ATP in 6/25.   - Mg low and was repleted  Possible suction alarm. Torsemide  decreased.  - No recurrent VT  - Continue amio carefully. Decrease to 200 bid today. Check amio labs - May need to consider restart mexiletine if he has another episode.   4. PAF/AFL - had DC-CV on 10/17/18 with subsequent short episodes of AF on amio 100 daily - Had recurrent AF in 10/22.= - Remains in NSR. On amio for recurrent VT  5. SAH - admit in 2/23 with [facility] requiring ventriculostomy - No significant residual deficits - Keep MAPs 70-80 - No change   6. h/o UGI AVM bleed - s/p APC x 4 lesions in 2/19 - hgb stabe - Continue PPI - Off ASA due to GIB.   7. CKD Stage IIIa:  - Creatinine baseline 1.3 - 1.7  - Scr 1.8 today  8. VAD  management - VAD interrogated personally. Parameters stable. - LDH 194 - INR goal 1.5-2.0 with h/o SAH Off ASA   - INR  1.7 Discussed warfarin dosing with PharmD personally. - MAPs ok - DL site ok  9. CAD:  s/p PCI RCA/PLOM with DES x2 and DES to mild LAD 1/18 - No s/s angina  - Continue statin. Off ASA due to GIB  10. Hypomagnesemia. - Mag 2.0 today. Continue po mag 400 daily  11. HTN - needs tight BP control with h/o SAH.  - MAPs ok today  12. [employer] fan - unfortunately this has been incurable even with VAD implant  I spent 39 minutes caring for this patient today including face to face time, ordering and reviewing labs, reviewing records from hospitalization, reviewing/obtaining bedside echocardiogram today, discussing long term prognosis with wife, seeing the patient, documenting in the record, and arranging follow ups.   Toribio Fuel, MD  7:48 PM

## 2024-02-28 NOTE — Progress Notes (Signed)
 Remote ICD Transmission

## 2024-02-29 ENCOUNTER — Ambulatory Visit (HOSPITAL_COMMUNITY)
Admission: RE | Admit: 2024-02-29 | Discharge: 2024-02-29 | Disposition: A | Discharge status: Home / Self Care | Source: Ambulatory Visit | Attending: Internal Medicine | Admitting: Internal Medicine

## 2024-02-29 ENCOUNTER — Ambulatory Visit (HOSPITAL_COMMUNITY): Payer: Self-pay | Admitting: Pharmacist

## 2024-02-29 ENCOUNTER — Ambulatory Visit: Admitting: Internal Medicine

## 2024-02-29 VITALS — BP 88/0 | HR 85 | Ht 65.0 in | Wt 168.2 lb

## 2024-02-29 DIAGNOSIS — Z7952 Long term (current) use of systemic steroids: Secondary | ICD-10-CM | POA: Diagnosis not present

## 2024-02-29 DIAGNOSIS — Z09 Encounter for follow-up examination after completed treatment for conditions other than malignant neoplasm: Secondary | ICD-10-CM | POA: Diagnosis not present

## 2024-02-29 DIAGNOSIS — I609 Nontraumatic subarachnoid hemorrhage, unspecified: Secondary | ICD-10-CM | POA: Diagnosis not present

## 2024-02-29 DIAGNOSIS — E0590 Thyrotoxicosis, unspecified without thyrotoxic crisis or storm: Secondary | ICD-10-CM | POA: Insufficient documentation

## 2024-02-29 DIAGNOSIS — Z95810 Presence of automatic (implantable) cardiac defibrillator: Secondary | ICD-10-CM | POA: Insufficient documentation

## 2024-02-29 DIAGNOSIS — I130 Hypertensive heart and chronic kidney disease with heart failure and stage 1 through stage 4 chronic kidney disease, or unspecified chronic kidney disease: Secondary | ICD-10-CM | POA: Insufficient documentation

## 2024-02-29 DIAGNOSIS — I2510 Atherosclerotic heart disease of native coronary artery without angina pectoris: Secondary | ICD-10-CM | POA: Diagnosis not present

## 2024-02-29 DIAGNOSIS — I480 Paroxysmal atrial fibrillation: Secondary | ICD-10-CM | POA: Insufficient documentation

## 2024-02-29 DIAGNOSIS — I472 Ventricular tachycardia, unspecified: Secondary | ICD-10-CM

## 2024-02-29 DIAGNOSIS — I5022 Chronic systolic (congestive) heart failure: Secondary | ICD-10-CM | POA: Insufficient documentation

## 2024-02-29 DIAGNOSIS — J9601 Acute respiratory failure with hypoxia: Secondary | ICD-10-CM

## 2024-02-29 DIAGNOSIS — Z95811 Presence of heart assist device: Secondary | ICD-10-CM | POA: Diagnosis not present

## 2024-02-29 DIAGNOSIS — Z79899 Other long term (current) drug therapy: Secondary | ICD-10-CM | POA: Insufficient documentation

## 2024-02-29 DIAGNOSIS — J449 Chronic obstructive pulmonary disease, unspecified: Secondary | ICD-10-CM | POA: Insufficient documentation

## 2024-02-29 DIAGNOSIS — Z955 Presence of coronary angioplasty implant and graft: Secondary | ICD-10-CM | POA: Insufficient documentation

## 2024-02-29 DIAGNOSIS — E785 Hyperlipidemia, unspecified: Secondary | ICD-10-CM | POA: Diagnosis not present

## 2024-02-29 DIAGNOSIS — Z7901 Long term (current) use of anticoagulants: Secondary | ICD-10-CM | POA: Diagnosis not present

## 2024-02-29 DIAGNOSIS — E0580 Other thyrotoxicosis without thyrotoxic crisis or storm: Secondary | ICD-10-CM

## 2024-02-29 DIAGNOSIS — N1831 Chronic kidney disease, stage 3a: Secondary | ICD-10-CM | POA: Insufficient documentation

## 2024-02-29 LAB — CBC
HCT: 47.2 % (ref 39.0–52.0)
Hemoglobin: 14.6 g/dL (ref 13.0–17.0)
MCH: 25 pg — ABNORMAL LOW (ref 26.0–34.0)
MCHC: 30.9 g/dL (ref 30.0–36.0)
MCV: 81 fL (ref 80.0–100.0)
Platelets: 225 K/uL (ref 150–400)
RBC: 5.83 MIL/uL — ABNORMAL HIGH (ref 4.22–5.81)
RDW: 18.6 % — ABNORMAL HIGH (ref 11.5–15.5)
WBC: 11.7 K/uL — ABNORMAL HIGH (ref 4.0–10.5)
nRBC: 0 % (ref 0.0–0.2)

## 2024-02-29 LAB — BASIC METABOLIC PANEL WITH GFR
Anion gap: 10 (ref 5–15)
BUN: 16 mg/dL (ref 6–20)
CO2: 26 mmol/L (ref 22–32)
Calcium: 8.6 mg/dL — ABNORMAL LOW (ref 8.9–10.3)
Chloride: 102 mmol/L (ref 98–111)
Creatinine, Ser: 1.85 mg/dL — ABNORMAL HIGH (ref 0.61–1.24)
GFR, Estimated: 41 mL/min — ABNORMAL LOW (ref >60)
Glucose, Bld: 120 mg/dL — ABNORMAL HIGH (ref 70–99)
Potassium: 4.3 mmol/L (ref 3.5–5.1)
Sodium: 138 mmol/L (ref 135–145)

## 2024-02-29 LAB — PROTIME-INR
INR: 1.4 — ABNORMAL HIGH (ref 0.8–1.2)
Prothrombin Time: 18.1 s — ABNORMAL HIGH (ref 11.4–15.2)

## 2024-02-29 LAB — MAGNESIUM: Magnesium: 1.7 mg/dL (ref 1.7–2.4)

## 2024-02-29 LAB — LACTATE DEHYDROGENASE: LDH: 221 U/L — ABNORMAL HIGH (ref 98–192)

## 2024-02-29 MED ORDER — AMIODARONE HCL 200 MG PO TABS: 200.0000 mg | ORAL_TABLET | Freq: Every day | ORAL | 11 refills | Status: AC

## 2024-02-29 NOTE — Patient Instructions (Signed)
 Decrease Amiodarone  to 200 mg once daily Return to clinic in 2 months

## 2024-02-29 NOTE — Progress Notes (Signed)
 Patient presents for 2 month f/u in VAD Clinic today with Marceil) wife . Denies issues with VAD equipment or drive line.   Pt walked into clinic independently. Pt states he is no longer using oxygen. States he is feeling good. Denies lightheadedness dizziness, shortness of breath, heart failure symptoms, and signs of bleeding.   Last shock 8/25. Amiodarone  increased to 400 mg BID at that time. Per Dr Cherrie will decrease Amiodarone  to 200 mg today. Updated prescription sent to pt's pharmacy. Pt & wife verbalized understanding of medication change.   Pt is currently taking Prednisone  10 mg daily for thyroid  management. Pt has f/u with DR Vianne in a couple of weeks.   Vital Signs: Doppler Pressure: 88 Automatic BP: 99/78 (87) HR: 85 SPO2: 94% RA    Weight: 168.2 lb w/ eqt Last weight: 163.2 lb w/ eqt  VAD Indication: Destination therapy- CKD   VAD interrogation & Equipment Management: Speed: 5600 Flow: 4.9 Power: 4.6 w    PI: 4.6   Alarms: none Events: 10-30  Fixed speed 5600 Low speed limit: 5300   Primary Controller: Replace back up battery in 27 months Back up controller: Replace back up battery in 22 months   Annual Equipment Maintenance on UBC/PM was performed on 03/27/2023.   I reviewed the LVAD parameters from today and compared the results to the patient's prior recorded data. LVAD interrogation was NEGATIVE for significant power changes, negative for clinical alarms and STABLE for PI events/speed drops. No programming changes were made and pump is functioning within specified parameters. Pt is performing daily controller and system monitor self tests along with completing weekly and monthly maintenance for LVAD equipment.   LVAD equipment check completed and is in good working order. Back up equipment present at this appointment.   Exit Site Care: Drive line is being maintained weekly by [name], his wife. Exit site healed and incorporated, the velour is fully  implanted at exit site. No redness, drainage, rash, or foul odor noted. Pt denies fever or chills. Provided patient with 8 weekly dressing kits,  and a box of adhesive remover for home use.  Device: [employer] Bi-V Therapies: VF 250 VT 167-300 Pacing: DDD 60 Last check: 02/21/24  BP & Labs:  Doppler BP 88 - Doppler is reflecting MAP  Hgb 14.6 - No S/S of bleeding. Specifically denies melena/BRBPR or nosebleeds.  LDH 221 with established baseline of 165- 250. Denies tea-colored urine. No power elevations noted on interrogation.    Patient Instructions:  Decrease Amiodarone  to 200 mg daily Coumadin  dosing per Nason PharmD Return to VAD clinic in 2 months for follow up with Dr Cherrie  Lauraine Ip RN VAD Coordinator  Office: [phone]  24/7 Pager: [phone]

## 2024-02-29 NOTE — Progress Notes (Signed)
 LVAD Clinic Follow Up Note HF MD: Dr Cherrie  HPI: David [name] is a 58 y.o. male with history of ICD (09 PCI to LAD, mRCA, '10 PCI to Lcx and '12 mRCA), HFrEF (EF ~20% for > 5 years), HLD and HTN. S/P LVAD HM-III 04/20/18.    Admitted 04/19/17 - 05/27/2017 for scheduled LVAD implant. On 12/27 he underwent HMIII LVAD implant. Hospital course c/b PNA shock liver and AKI. Required short term HD.  Readmitted 05/30/17 hgb 3.8. INR 2.9.transfused 4u pRBCs. 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 Underwent ventriculostomy. Admission c/b by periods of confusion and urinary retention. Discharged to CIR  Was seen for acute visit on 03/27/23 for ICD firing x 2 on Thanksgiving.. Electrolytes ok. Seen by Dr. Fernande and ICD reprogrammed   Admitted on 07/10/23 for respiratory distress in setting of Influenza A infection.  Readmitted with 08/03/23 with recurrent respiratory distress and hypoxia. Felt to be combination of HF and possible residual infection. Diuresed. Underwent RHC which showed well optimized hemodynamics. VAD speed increased to 5600. Had persistent hypoxia despite abx and diuresis. Concern for amio toxicity but ESR was low. Hi-res CT had airspace disease and atx. Also increased mediastinal lymphadenopathy, Seen by Dr. Claudene in Pulmonary. Recommended pulmonary toilet and home O2  Admitted from 5/22-5/31/25 with hypoxic respiratory failure requiring intubation. Treated with abx for possible PNA; aggressively diuresed and treated with steroids for amio toxicity (although this was later felt to be unlikely). He was diuresed and speeds were adjusted with RHC prior to discharge demonstrating adequate hemodynamics.   Seen in clinic 10/02/23 with VT at home. Device interrogation demonstrated multiple attempts at ATP followed by shock x 1 appropriately delivered  for VT. (Was taking amio 200 daily at time). Mg was low and supped  Here for f/u with his wife. Doing well. Off O2. Remains active. Denies SOB. Taking torsemide  every other day. No edema. Denies orthopnea or PND. No fevers, chills or problems with driveline. No bleeding, melena or neuro symptoms. No VAD alarms. Taking all meds as prescribed.  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 On prednisone  10 daily and methimazole       LVAD Documentation    02/29/2024  Device Info  LVAD Type: Heartmate III  Date of Implant: 04/20/2017  Therapy Type: Destination Therapy      02/29/2024  Vitals  Heart Rate: 85 BPM  Automatic BP: 99/78  Doppler MAP: 87 mmHg  SpO2: 94 %    Last 3 Weights Weight Weight  02/29/2024 76.295 kg 168 lb 3.2 oz  01/12/2024 74.027 kg 163 lb 3.2 oz  12/07/2023 71.215 kg 157 lb       02/29/2024  LVAD Paramaters  Speed: 5600 RPM  Flow: 5 LPM  PI: 5  Power: 5 Watts  Hematocrit: 47 %  Alarms: none  Events: 10-30  Last Speed Change Date: 10/02/2023  Last Ramp Echo Date: 05/05/2017  Last Right Heart Cath Date: 09/22/2023  Bleeding History: Yes  Type of Bleeding Hx: GI Bleed,Other  Type of Bleeding Hx: SAH  Type of Dressing: Weekly  Annual Maintenance Date: 03/29/2023    Labs    Units 02/28/24 0000 02/19/24 0000 02/12/24 0000 01/15/24 0000 01/12/24 0859 12/04/23 0000 11/27/23 1135 11/13/23 0000 11/09/23 1339  INR  1.9* 2.0 1.6*   < > 1.7*   < > 1.3*   < >  1.3*  LDH U/L  --   --   --   --  194*  --  230*  --  211*  HGB g/dL  --   --   --   --  85.5  --  14.3  --  13.9  CREATININE mg/dL  --   --   --   --  8.13*  --  1.78*  --  1.65*   < > = values in this interval not displayed.         Past Medical History:  Diagnosis Date   AICD (automatic cardioverter/defibrillator) present    Anemia    Anxiety    CAD (coronary artery disease) 2009   AMI in 12/2007 with PCI to LAD, staged PCI to  mid/distal RCA, NSTEMI in 02/2009 with BMS to LCx   CHF  (congestive heart failure) (HCC)    Chronic kidney disease 11/03/2016   stage 3 kidney disease   Dysrhythmia    arrythmia problems at some point, fatal rhythms   History of blood transfusion    I was bleeding from was rectum (04/19/2017)   HLD (hyperlipidemia)    HTN (hypertension)    Hyperlipidemia 03/15/2011   Ischemic cardiomyopathy    Admitted in 07/2010 with CHF exacerbation   Myocardial infarction (HCC)    I've had 4 (04/19/2017)   Pneumonia 2018 X 1   Seizures (HCC)    one seizure in 04/2016 during cardiac event (04/19/2017)   STEMI 2009 (anterior), 2010 (lateral), and 2012 (inferior) 03/14/2011    Current Outpatient Medications  Medication Sig Dispense Refill   acetaminophen  (TYLENOL ) 500 MG tablet Take 1,000 mg by mouth 2 (two) times daily as needed for moderate pain (pain score 4-6), fever or headache.     atorvastatin  (LIPITOR) 40 MG tablet Take 1 tablet (40 mg total) by mouth daily. 90 tablet 3   diphenhydrAMINE  (BENADRYL ) 25 mg capsule Take 25 mg by mouth every 6 (six) hours as needed for allergies.     fluticasone  (FLONASE ) 50 MCG/ACT nasal spray Place 2 sprays into both nostrils daily. 9.9 mL 3   magnesium  oxide (MAG-OX) 400 (240 Mg) MG tablet Take 1 tablet (400 mg total) by mouth 2 (two) times daily. 180 tablet 3   methimazole  (TAPAZOLE ) 5 MG tablet Take 1 tablet (5 mg total) by mouth daily. With a meal. 30 tablet 2   mexiletine (MEXITIL) 150 MG capsule Take 1 capsule (150 mg total) by mouth 2 (two) times daily. 180 capsule 3   Multiple Vitamins-Minerals (MENS 50+ MULTIVITAMIN) TABS Take 1 tablet by mouth daily.     pantoprazole  (PROTONIX ) 40 MG tablet Take 1 tablet (40 mg total) by mouth daily. 90 tablet 3   potassium chloride  SA (KLOR-CON  M) 20 MEQ tablet Take 1 tablet (20 mEq total) by mouth daily. 90 tablet 3   predniSONE  (DELTASONE ) 10 MG tablet Take 1 tablet (10 mg total) by mouth daily with breakfast. 30 tablet 2   tamsulosin  (FLOMAX ) 0.4 MG CAPS capsule  Take 1 capsule (0.4 mg total) by mouth daily. 90 capsule 3   torsemide  (DEMADEX ) 20 MG tablet Take 1 tablet (20 mg total) by mouth every other day. 45 tablet 3   traZODone  (DESYREL ) 50 MG tablet Take 1 tablet (50 mg total) by mouth at bedtime. 90 tablet 3   warfarin (COUMADIN ) 2 MG tablet TAKE 1 MG daily per VAD Pharm D 30 tablet 6   amiodarone  (PACERONE ) 200 MG tablet Take 1 tablet (200  mg total) by mouth daily. 120 tablet 11   No current facility-administered medications for this encounter.    Plavix [clopidogrel bisulfate]  Review of systems complete and found to be negative unless listed in HPI.       Wt Readings from Last 3 Encounters:  02/29/24 76.3 kg (168 lb 3.2 oz)  01/12/24 74 kg (163 lb 3.2 oz)  12/07/23 71.2 kg (157 lb)     Physical Exam: Vitals:   02/29/24 1427 02/29/24 1428  BP: 99/78 (!) 88/0  Pulse: 85   SpO2: 94%    General:  NAD.  HEENT: normal  Neck: supple. JVP not elevated.  Carotids 2+ bilat; no bruits. No lymphadenopathy or thryomegaly appreciated. Cor: LVAD hum.  Lungs: Clear but decreased Abdomen:  soft, nontender, non-distended. No hepatosplenomegaly. No bruits or masses. Good bowel sounds. Driveline site clean. Anchor in place.  Extremities: no cyanosis, clubbing, rash. Warm no edema  Neuro: alert & oriented x 3. No focal deficits. Moves all 4 without problem     ASSESSMENT AND PLAN:  1. Acute hypoxic respiratory failure - resolved - Admitted 5/25 with acute resp failure. Combination of volume overload & possible PNA and post-viral syndrome. Not felt to have amio toxicity - Chest CT improved. Reviewed with him and his wife. Does have underlying COPD - Much improved. Off O2  - He is currently on prednisone  for amio-thyrotoxicity. Will need to follow lung status closely when off prednisone .  2. Chronic systolic CHF with HM III LVAD implant 04/20/17:  - Doing well. NYHA II  - Volume ok. Continue torsemide  20 daily. Take extra as needed - Echo  in 6/25 TTE  IVS midline; mild-mod reduced RV function (stable), IVC 1.5cm.   3. Ventricular tachycardia - Had ICD shock x 2 for VT on 03/23/23 (device reprogrammed at the time) - Recurrent VT with appropriate shock after failed ATP in 6/25.   - ICD interrogated in clinic. No recent VT - Continue amio carefully. Decrease amio to 200 daily . Check amio labs - May need to consider restart mexiletine if he has another episode.   4. PAF/AFL - had DC-CV on 10/17/18 with subsequent short episodes of AF on amio 100 daily - Had recurrent AF in 10/22.= - Remains in NSR. On amio for VT.   5. SAH - admit in 2/23 with [facility] requiring ventriculostomy - No significant residual deficits - Keep MAPs 70-80 - No change   6. h/o UGI AVM bleed - s/p APC x 4 lesions in 2/19 - hgb stable 14.6 - Continue PPI - Off ASA due to GIB.   7. CKD Stage IIIa:  - Creatinine baseline 1.3 - 1.7  - Scr stable at 1.85 today  8. VAD management - VAD interrogated personally. Parameters stable. - LDH 221 - INR goal 1.5-2.0 with h/o SAH Off ASA   - INR  1.4 Discussed warfarin dosing with PharmD personally. - MAPs ok - DL site ok  9. CAD:  s/p PCI RCA/PLOM with DES x2 and DES to mild LAD 1/18 - No s/s angina  - Continue statin. Off ASA due to GIB  10. Hypomagnesemia. - Mag 1.7 today. Continue po mag 400 daily  11. HTN - needs tight BP control with h/o SAH.  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 Reduce amio dose today  13. [employer] fan - unfortunately this has been incurable even with VAD implant  I spent a total of 40  minutes today: 1) reviewing the patient's medical records including previous charts, labs and recent notes from other providers; 2) examining the patient and counseling them on their medical issues/explaining the plan of care; 3) adjusting meds as needed and 4) ordering lab work or other needed tests.     Toribio Fuel, MD  2:53 PM

## 2024-03-01 ENCOUNTER — Other Ambulatory Visit: Payer: Self-pay | Admitting: Internal Medicine

## 2024-03-02 ENCOUNTER — Other Ambulatory Visit: Payer: Self-pay | Admitting: Internal Medicine

## 2024-03-07 ENCOUNTER — Ambulatory Visit (HOSPITAL_COMMUNITY): Payer: Self-pay | Admitting: Pharmacist

## 2024-03-07 DIAGNOSIS — Z95811 Presence of heart assist device: Secondary | ICD-10-CM

## 2024-03-07 LAB — POCT INR: INR: 1.9 — AB (ref 2.0–3.0)

## 2024-03-08 ENCOUNTER — Encounter (HOSPITAL_COMMUNITY): Admitting: Internal Medicine

## 2024-03-11 ENCOUNTER — Ambulatory Visit (HOSPITAL_COMMUNITY): Payer: Self-pay | Admitting: Pharmacist

## 2024-03-11 DIAGNOSIS — Z95811 Presence of heart assist device: Secondary | ICD-10-CM

## 2024-03-11 LAB — POCT INR: INR: 2.1 (ref 2.0–3.0)

## 2024-03-15 ENCOUNTER — Ambulatory Visit (INDEPENDENT_AMBULATORY_CARE_PROVIDER_SITE_OTHER): Admitting: Internal Medicine

## 2024-03-15 ENCOUNTER — Encounter: Payer: Self-pay | Admitting: Internal Medicine

## 2024-03-15 ENCOUNTER — Other Ambulatory Visit

## 2024-03-15 VITALS — HR 83 | Ht 65.0 in | Wt 168.8 lb

## 2024-03-15 DIAGNOSIS — E0590 Thyrotoxicosis, unspecified without thyrotoxic crisis or storm: Secondary | ICD-10-CM

## 2024-03-15 LAB — TSH: TSH: 14.86 m[IU]/L — ABNORMAL HIGH (ref 0.40–4.50)

## 2024-03-15 LAB — T4, FREE: Free T4: 1.2 ng/dL (ref 0.8–1.8)

## 2024-03-15 LAB — T3, FREE: T3, Free: 2.4 pg/mL (ref 2.3–4.2)

## 2024-03-15 NOTE — Progress Notes (Signed)
 Patient ID: David [name], male   DOB: 1965/05/10, 58 y.o.   MRN: [id number]  HPI  David [name] is a 58 y.o.-year-old male, initially referred by his PCP, Dr. 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  Last visit 3 months ago. He is here with his wife who offers part of the history, particularly related to his past medical history, medications, and symptoms.  Interim history: He has jerky movements in R hand - intermittently. Otherwise, he does not have tremors, palpitations, weight loss, heat intolerance.  Reviewed history: Patient has a history of ischemic cardiomyopathy complicated by CHF and atrial fibrillation.  He has an LVAD (since 12/20218) and is on amiodarone , increased to 200 mg twice a day before our first visit in 11/2023.  Afterwards, he was found to have a low TSH, while previous TSH levels were normal.  His free T3 levels were normal but free T4 was elevated starting in 2018.  Of note, in 08/2023, he had severe influenza A, for which he had to be hospitalized for 7 days.  He then developed pneumonia and had to be hospitalized again.  After discharge, he developed acute respiratory distress and was started on oxygen.    I first saw him in 11/2023, he was essentially asymptomatic, only with cough due to seasonal allergies.  In 11/2023 we started: - Prednisone  10 mg daily in a.m. - Methimazole  5 mg daily in a.m.  I reviewed pt's thyroid  tests: Lab Results  Component Value Date   TSH 0.01 (L) 11/28/2023   TSH <0.010 (L) 11/09/2023   TSH 3.382 03/27/2023   TSH 3.052 01/06/2023   TSH 1.916 05/10/2022   TSH 1.387 10/11/2021   TSH 2.076 08/03/2021   TSH 1.302 10/05/2020   TSH 2.464 03/24/2020   TSH 2.873 11/13/2019   FREET4 2.4 (H) 11/28/2023   FREET4 2.01 (H) 11/09/2023   FREET4 1.45 (H) 03/27/2023   FREET4 1.28 (H) 01/06/2023   FREET4 1.15 (H) 05/10/2022   FREET4 1.22 (H) 08/03/2021   FREET4 1.43 (H) 03/13/2017   FREET4 1.18  03/06/2009   T3FREE 3.9 11/28/2023   T3FREE 3.6 11/09/2023   T3FREE 2.7 03/27/2023   T3FREE 2.5 01/06/2023   T3FREE 2.6 05/10/2022   Antithyroid antibodies: Lab Results  Component Value Date   TSI 102 11/28/2023   Component     Latest Ref Rng 11/28/2023  Thyroperoxidase Ab SerPl-aCnc     <9 IU/mL 1   Thyroglobulin Ab     < or = 1 IU/mL 1    White blood count level was not low - 11/27/2023.SABRA  Pt denies: - feeling nodules in neck - hoarseness - dysphagia - choking  Pt does not have a FH of thyroid  ds. No FH of thyroid  cancer. No h/o radiation tx to head or neck. No seaweed or kelp, no recent contrast studies. No steroid use other than Flonase . No herbal supplements. No Biotin use.  No lithium use.  Pt. also has a history of HTN, CAD with history of AMI, HL, CKD stage III, history of subarachnoid hemorrhage.  ROS:  + see HPI  Past Medical History:  Diagnosis Date   AICD (automatic cardioverter/defibrillator) present    Anemia    Anxiety    CAD (coronary artery disease) 2009   AMI in 12/2007 with PCI to LAD, staged PCI to  mid/distal RCA, NSTEMI in 02/2009 with BMS to LCx   CHF (congestive heart failure) (HCC)    Chronic kidney disease 11/03/2016  stage 3 kidney disease   Dysrhythmia    arrythmia problems at some point, fatal rhythms   History of blood transfusion    I was bleeding from was rectum (04/19/2017)   HLD (hyperlipidemia)    HTN (hypertension)    Hyperlipidemia 03/15/2011   Ischemic cardiomyopathy    Admitted in 07/2010 with CHF exacerbation   Myocardial infarction (HCC)    I've had 4 (04/19/2017)   Pneumonia 2018 X 1   Seizures (HCC)    one seizure in 04/2016 during cardiac event (04/19/2017)   STEMI 2009 (anterior), 2010 (lateral), and 2012 (inferior) 03/14/2011   Past Surgical History:  Procedure Laterality Date   AORTIC VALVE REPAIR N/A 04/20/2017   Procedure: AORTIC VALVE REPAIR;  Surgeon: Lucas Dorise POUR, MD;  Location: [facility] OR;   Service: Open Heart Surgery;  Laterality: N/A;   BIV ICD INSERTION CRT-D N/A 08/01/2016   Procedure: BiV ICD Insertion CRT-D;  Surgeon: Will Gladis Norton, MD;  Location: [facility] INVASIVE CV LAB;  Service: Cardiovascular;  Laterality: N/A;   CARDIAC CATHETERIZATION N/A 05/05/2016   Procedure: Right/Left Heart Cath and Coronary Angiography;  Surgeon: Toribio JONELLE Fuel, MD;  Location: [facility] INVASIVE CV LAB;  Service: Cardiovascular;  Laterality: N/A;   CARDIAC CATHETERIZATION N/A 05/09/2016   Procedure: Coronary Stent Intervention;  Surgeon: Peter M Jordan, MD;  Location: [facility] INVASIVE CV LAB;  Service: Cardiovascular;  Laterality: N/A;   CARDIAC CATHETERIZATION N/A 05/09/2016   Procedure: Intravascular Pressure Wire/FFR Study;  Surgeon: Peter M Jordan, MD;  Location: [facility] INVASIVE CV LAB;  Service: Cardiovascular;  Laterality: N/A;   CARDIOVERSION N/A 10/17/2018   Procedure: CARDIOVERSION;  Surgeon: Fuel Toribio JONELLE, MD;  Location: [facility] ENDOSCOPY;  Service: Cardiovascular;  Laterality: N/A;   CORONARY ANGIOPLASTY WITH STENT PLACEMENT     i've got a total of 12 stents (04/19/2017)   ELECTROPHYSIOLOGY STUDY N/A 06/24/2016   Procedure: Electrophysiology Study;  Surgeon: Elspeth JAYSON Sage, MD;  Location: [facility] INVASIVE CV LAB;  Service: Cardiovascular;  Laterality: N/A;   ENTEROSCOPY N/A 06/03/2017   Procedure: ENTEROSCOPY;  Surgeon: Leigh Elspeth SQUIBB, MD;  Location: [facility] ENDOSCOPY;  Service: Gastroenterology;  Laterality: N/A;   EPICARDIAL PACING LEAD PLACEMENT Left 11/07/2016   Procedure: LV EPICARDIAL PACING LEAD PLACEMENT VIA LEFT MINI THORACOTOMY;  Surgeon: Lucas Dorise POUR, MD;  Location: [facility] OR;  Service: Thoracic;  Laterality: Left;   ICD IMPLANT N/A 06/24/2016   Procedure: POSSIBLE  ICD Implant;  Surgeon: Elspeth JAYSON Sage, MD;  Location: [facility] INVASIVE CV LAB;  Service: Cardiovascular;  Laterality: N/A;   INSERTION OF IMPLANTABLE LEFT VENTRICULAR ASSIST DEVICE N/A 04/20/2017   Procedure: INSERTION OF  IMPLANTABLE LEFT VENTRICULAR ASSIST DEVICE, Aortic Valve repair;  Surgeon: Lucas Dorise POUR, MD;  Location: [facility] OR;  Service: Open Heart Surgery;  Laterality: N/A;  Heartmate 3   IR FLUORO GUIDE CV LINE RIGHT  05/08/2017   IR FLUORO GUIDE CV MIDLINE PICC RIGHT  03/14/2017   IR REMOVAL TUN CV CATH W/O FL  05/25/2017   IR US  GUIDE VASC ACCESS RIGHT  03/14/2017   IR US  GUIDE VASC ACCESS RIGHT  05/08/2017   LEAD EXTRACTION N/A 05/24/2023   Procedure: LEAD EXTRACTION;  Surgeon: Cindie Ole DASEN, MD;  Location: [facility] INVASIVE CV LAB;  Service: Cardiovascular;  Laterality: N/A;   LEFT HEART CATHETERIZATION WITH CORONARY ANGIOGRAM N/A 03/13/2011   Procedure: LEFT HEART CATHETERIZATION WITH CORONARY ANGIOGRAM;  Surgeon: Dorn JINNY Lesches, MD;  Location: [facility] CATH LAB;  Service: Cardiovascular;  Laterality: N/A;  MULTIPLE EXTRACTIONS WITH ALVEOLOPLASTY N/A 04/12/2017   Procedure: Extraction of tooth #'s 2, 5-12, 17, 20-29 with alveoloplasty amd maxillary right buccal exostoses reductions.;  Surgeon: Cyndee Tanda FALCON, DDS;  Location: [facility] OR;  Service: Oral Surgery;  Laterality: N/A;   RIGHT HEART CATH N/A 04/19/2017   Procedure: RIGHT HEART CATH;  Surgeon: Cherrie Toribio SAUNDERS, MD;  Location: [facility] INVASIVE CV LAB;  Service: Cardiovascular;  Laterality: N/A;   RIGHT HEART CATH N/A 08/08/2023   Procedure: RIGHT HEART CATH;  Surgeon: Cherrie Toribio SAUNDERS, MD;  Location: [facility] INVASIVE CV LAB;  Service: Cardiovascular;  Laterality: N/A;   RIGHT HEART CATH N/A 09/22/2023   Procedure: RIGHT HEART CATH;  Surgeon: Zenaida Morene PARAS, MD;  Location: [facility] INVASIVE CV LAB;  Service: Cardiovascular;  Laterality: N/A;   RIGHT/LEFT HEART CATH AND CORONARY ANGIOGRAPHY N/A 03/10/2017   Procedure: RIGHT/LEFT HEART CATH AND CORONARY ANGIOGRAPHY;  Surgeon: Cherrie Toribio SAUNDERS, MD;  Location: [facility] INVASIVE CV LAB;  Service: Cardiovascular;  Laterality: N/A;   TEE WITHOUT CARDIOVERSION N/A 04/20/2017   Procedure: TRANSESOPHAGEAL ECHOCARDIOGRAM  (TEE);  Surgeon: Lucas Dorise POUR, MD;  Location: [facility] OR;  Service: Open Heart Surgery;  Laterality: N/A;   TEE WITHOUT CARDIOVERSION N/A 10/17/2018   Procedure: TRANSESOPHAGEAL ECHOCARDIOGRAM (TEE);  Surgeon: Cherrie Toribio SAUNDERS, MD;  Location: [facility] ENDOSCOPY;  Service: Cardiovascular;  Laterality: N/A;   TRANSESOPHAGEAL ECHOCARDIOGRAM (CATH LAB) N/A 05/24/2023   Procedure: TRANSESOPHAGEAL ECHOCARDIOGRAM;  Surgeon: Cindie Ole DASEN, MD;  Location: [facility] INVASIVE CV LAB;  Service: Cardiovascular;  Laterality: N/A;   Social History   Socioeconomic History   Marital status: Married    Spouse name: [name]   Number of children: 3   Years of education: Not on file   Highest education level: Not on file  Occupational History   Not on file  Tobacco Use   Smoking status: Former    Current packs/day: 0.00    Average packs/day: 0.5 packs/day for 43.0 years (21.5 ttl pk-yrs)    Types: Cigarettes, Cigars    Start date: 04/29/1973    Quit date: 04/29/2016    Years since quitting: 7.8   Smokeless tobacco: Never  Vaping Use   Vaping status: Former   Quit date: 04/29/2016  Substance and Sexual Activity   Alcohol  use: No   Drug use: No   Sexual activity: Yes  Other Topics Concern   Not on file  Social History Narrative   Not on file   Social Drivers of Health   Financial Resource Strain: High Risk (07/04/2022)   Overall Financial Resource Strain (CARDIA)    Difficulty of Paying Living Expenses: Very hard  Food Insecurity: No Food Insecurity (09/16/2023)   Hunger Vital Sign    Worried About Running Out of Food in the Last Year: Never true    Ran Out of Food in the Last Year: Never true  Transportation Needs: No Transportation Needs (09/16/2023)   PRAPARE - Administrator, Civil Service (Medical): No    Lack of Transportation (Non-Medical): No  Physical Activity: Inactive (03/10/2017)   Exercise Vital Sign    Days of Exercise per Week: 0 days    Minutes of Exercise per Session: 0 min   Stress: No Stress Concern Present (03/10/2017)   [employer] of Occupational [employer] - Occupational Stress Questionnaire    Feeling of Stress : Not at all  Social Connections: Not on file  Intimate Partner Violence: Not At Risk (09/16/2023)   Humiliation, Afraid, Rape, and Kick  questionnaire    Fear of Current or Ex-Partner: No    Emotionally Abused: No    Physically Abused: No    Sexually Abused: No   Current Outpatient Medications on File Prior to Visit  Medication Sig Dispense Refill   acetaminophen  (TYLENOL ) 500 MG tablet Take 1,000 mg by mouth 2 (two) times daily as needed for moderate pain (pain score 4-6), fever or headache.     amiodarone  (PACERONE ) 200 MG tablet Take 1 tablet (200 mg total) by mouth daily. 120 tablet 11   atorvastatin  (LIPITOR) 40 MG tablet Take 1 tablet (40 mg total) by mouth daily. 90 tablet 3   diphenhydrAMINE  (BENADRYL ) 25 mg capsule Take 25 mg by mouth every 6 (six) hours as needed for allergies.     fluticasone  (FLONASE ) 50 MCG/ACT nasal spray Place 2 sprays into both nostrils daily. 9.9 mL 3   magnesium  oxide (MAG-OX) 400 (240 Mg) MG tablet Take 1 tablet (400 mg total) by mouth 2 (two) times daily. 180 tablet 3   methimazole  (TAPAZOLE ) 5 MG tablet TAKE 1 TABLET BY MOUTH ONCE DAILY WITH A MEAL 30 tablet 0   mexiletine (MEXITIL) 150 MG capsule Take 1 capsule (150 mg total) by mouth 2 (two) times daily. 180 capsule 3   Multiple Vitamins-Minerals (MENS 50+ MULTIVITAMIN) TABS Take 1 tablet by mouth daily.     pantoprazole  (PROTONIX ) 40 MG tablet Take 1 tablet (40 mg total) by mouth daily. 90 tablet 3   potassium chloride  SA (KLOR-CON  M) 20 MEQ tablet Take 1 tablet (20 mEq total) by mouth daily. 90 tablet 3   predniSONE  (DELTASONE ) 10 MG tablet Take 1 tablet (10 mg total) by mouth daily with breakfast. 30 tablet 2   tamsulosin  (FLOMAX ) 0.4 MG CAPS capsule Take 1 capsule (0.4 mg total) by mouth daily. 90 capsule 3   torsemide  (DEMADEX ) 20 MG tablet Take 1  tablet (20 mg total) by mouth every other day. 45 tablet 3   traZODone  (DESYREL ) 50 MG tablet Take 1 tablet (50 mg total) by mouth at bedtime. 90 tablet 3   warfarin (COUMADIN ) 2 MG tablet TAKE 1 MG daily per [facility] Pharm D 30 tablet 6   No current facility-administered medications on file prior to visit.   Allergies  Allergen Reactions   Plavix [Clopidogrel Bisulfate] Hives   Family History  Problem Relation Age of Onset   Coronary artery disease Father    Hypertension Father    Hypertension Sister    Coronary artery disease Brother    Emphysema Brother    Coronary artery disease Mother    Hypertension Mother    Emphysema Mother    Hypertension Daughter    Obesity Daughter    Diabetes Other    Hypertension Other    Coronary artery disease Other    PE: Pulse 83   Ht 5' 5 (1.651 m)   Wt 168 lb 12.8 oz (76.6 kg)   SpO2 94%   BMI 28.09 kg/m  Wt Readings from Last 3 Encounters:  03/15/24 168 lb 12.8 oz (76.6 kg)  02/29/24 168 lb 3.2 oz (76.3 kg)  01/12/24 163 lb 3.2 oz (74 kg)   Constitutional: normal weight, in NAD Eyes:  EOMI, no exophthalmos ENT: no neck masses, no cervical lymphadenopathy Cardiovascular: Heart sounds intermittently audible, No MRG Respiratory: CTA B Musculoskeletal: no deformities Skin:no rashes Neurological: no tremor with outstretched hands  ASSESSMENT: 1.  Amiodarone -induced thyrotoxicosis  PLAN:  Patient with significant cardiac history including ischemic cardiomyopathy, atrial  fibrillation, CHF + ICD and an LVAD, who developed thyrotoxicosis on amiodarone , after an increase in dose to 200 mg twice a day.  Of note, this was subsequently reduced to only once a day. -I first saw the patient in 11/2023 and at that time he denied thyrotoxic symptoms including weight loss, heat intolerance, hyperdefecation, palpitations, anxiety, tremors, loose stools.  We reviewed together his TSH levels and they have been normal up until 10/2023.  Interestingly, his  free T3 levels were normal throughout but free T4 started to be elevated in 2018.  A TSH was found to be undetectable on 11/09/2023 and he was referred to endocrinology.  I saw the patient on 11/28/2023 and by that time, TSH was detectable, at 0.01 and free T4 was still elevated but free T3 was normal.  His TSI's, TPO, and ATA antibodies were not elevated. -He did not appear to have exogenous causes for the low TSH other than amiodarone .  He did have a severe episode of influenza A complicated by pneumonia and respiratory distress which could have caused a transient thyrotoxicosis, but the tests were checked after the episode resolved.  We discussed that he may likely had amiodarone  induced thyrotoxicosis (AIT), but it was unclear which type:  1. Type 1 AIT: iodine  induced hyperthyroidism >> increase thyroid  hormone production 2. Type 2 AIT: iodine -induced thyroiditis >> increase thyroid  hormone leakage from the inflamed gland Graves' disease, toxic multinodular goiter or toxic adenoma or even subacute thyroiditis diagnoses were less likely due to negative TSI antibodies (likely excluding Graves' disease), and normal ESR (likely excluding subacute thyroiditis).  I did not feel that he was only needed a thyroid  ultrasound to check the thyroid  blood flow, but this may not be useful in the setting of amiodarone  use.  Same for a thyroid  uptake and scan. -At last visit, we started treatment for both type I and type II AIT with 10 mg of prednisone  and 5 mg of methimazole  and discussed about having him come back for labs in 3 to 4 weeks.  However, he did not return for these... I read my previous message to the wife, and she does remember being called by the nurse but they did not schedule a lab appointment unfortunately.  We did discuss at today's visit that every time we change the thyroid  medication, he needs to return for labs in 3 to 6 weeks, depending on the results.  They agree to do this going forward. - At  today's visit, we discussed that if the treatment is not helping, he may need thyroidectomy.  Wife was wondering whether his condition had to be treated, since he was asymptomatic.  We discussed about the deleterious side effects of uncontrolled hyperthyroidism on the cardiovascular system and also bone health. - At today's visit, we will recheck his TFTs and change the methimazole  and prednisone  dose accordingly. - RTC in 3 months, but possibly sooner for repeat labs   Orders Placed This Encounter  Procedures   TSH   T4, free   T3, free  He needs a refill if we continue on the above medications.  Lela Fendt, MD PhD [facility]

## 2024-03-15 NOTE — Patient Instructions (Addendum)
 Please continue: - Prednisone  10 mg daily in a.m. - Methimazole  5 mg daily in a.m.  Please stop at the lab.  Please return in 3 months for another visit, but likely sooner for labs.

## 2024-03-18 ENCOUNTER — Ambulatory Visit (HOSPITAL_COMMUNITY): Payer: Self-pay | Admitting: Pharmacist

## 2024-03-18 ENCOUNTER — Ambulatory Visit: Payer: Self-pay | Admitting: Internal Medicine

## 2024-03-18 DIAGNOSIS — Z95811 Presence of heart assist device: Secondary | ICD-10-CM

## 2024-03-18 LAB — POCT INR: INR: 1.8 — AB (ref 2.0–3.0)

## 2024-03-18 NOTE — Addendum Note (Signed)
 Addended by: TRIXIE FILE on: 03/18/2024 04:46 PM   Modules accepted: Orders

## 2024-03-25 ENCOUNTER — Ambulatory Visit (HOSPITAL_COMMUNITY): Payer: Self-pay | Admitting: Pharmacist

## 2024-03-25 DIAGNOSIS — Z95811 Presence of heart assist device: Secondary | ICD-10-CM

## 2024-03-25 LAB — POCT INR: INR: 1.6 — AB (ref 2.0–3.0)

## 2024-04-01 ENCOUNTER — Ambulatory Visit (HOSPITAL_COMMUNITY): Payer: Self-pay | Admitting: Pharmacist

## 2024-04-01 ENCOUNTER — Other Ambulatory Visit: Payer: Self-pay | Admitting: Internal Medicine

## 2024-04-01 DIAGNOSIS — Z95811 Presence of heart assist device: Secondary | ICD-10-CM

## 2024-04-01 LAB — POCT INR: INR: 1.6 — AB (ref 2.0–3.0)

## 2024-04-08 ENCOUNTER — Ambulatory Visit (HOSPITAL_COMMUNITY): Payer: Self-pay | Admitting: Pharmacist

## 2024-04-08 DIAGNOSIS — Z95811 Presence of heart assist device: Secondary | ICD-10-CM

## 2024-04-08 LAB — POCT INR: INR: 1.9 — AB (ref 2.0–3.0)

## 2024-04-10 ENCOUNTER — Other Ambulatory Visit

## 2024-04-10 ENCOUNTER — Other Ambulatory Visit: Payer: Self-pay

## 2024-04-10 DIAGNOSIS — E0590 Thyrotoxicosis, unspecified without thyrotoxic crisis or storm: Secondary | ICD-10-CM

## 2024-04-10 NOTE — Telephone Encounter (Signed)
Orders have been printed and mailed.

## 2024-04-15 ENCOUNTER — Ambulatory Visit (HOSPITAL_COMMUNITY): Payer: Self-pay | Admitting: Pharmacist

## 2024-04-15 DIAGNOSIS — Z95811 Presence of heart assist device: Secondary | ICD-10-CM

## 2024-04-15 LAB — POCT INR: INR: 1.8 — AB (ref 2.0–3.0)

## 2024-04-21 ENCOUNTER — Other Ambulatory Visit (HOSPITAL_COMMUNITY): Payer: Self-pay | Admitting: Internal Medicine

## 2024-04-21 DIAGNOSIS — I5022 Chronic systolic (congestive) heart failure: Secondary | ICD-10-CM

## 2024-04-21 DIAGNOSIS — Z95811 Presence of heart assist device: Secondary | ICD-10-CM

## 2024-04-22 ENCOUNTER — Other Ambulatory Visit

## 2024-04-22 ENCOUNTER — Ambulatory Visit (HOSPITAL_COMMUNITY): Payer: Self-pay | Admitting: Pharmacist

## 2024-04-22 DIAGNOSIS — Z95811 Presence of heart assist device: Secondary | ICD-10-CM

## 2024-04-22 LAB — POCT INR: INR: 2.1 (ref 2.0–3.0)

## 2024-04-23 ENCOUNTER — Ambulatory Visit: Payer: Self-pay | Admitting: Internal Medicine

## 2024-04-23 LAB — T3, FREE: T3, Free: 2.9 pg/mL (ref 2.0–4.4)

## 2024-04-23 LAB — T4, FREE: Free T4: 1.17 ng/dL (ref 0.82–1.77)

## 2024-04-23 LAB — TSH: TSH: 11.7 u[IU]/mL — ABNORMAL HIGH (ref 0.450–4.500)

## 2024-04-24 ENCOUNTER — Other Ambulatory Visit: Payer: Self-pay | Admitting: Internal Medicine

## 2024-04-24 DIAGNOSIS — E0590 Thyrotoxicosis, unspecified without thyrotoxic crisis or storm: Secondary | ICD-10-CM

## 2024-04-29 ENCOUNTER — Ambulatory Visit (HOSPITAL_COMMUNITY): Payer: Self-pay | Admitting: Pharmacist

## 2024-04-29 LAB — POCT INR: INR: 1.7 — AB (ref 2.0–3.0)

## 2024-04-30 ENCOUNTER — Other Ambulatory Visit (HOSPITAL_COMMUNITY): Payer: Self-pay | Admitting: Internal Medicine

## 2024-04-30 DIAGNOSIS — I10 Essential (primary) hypertension: Secondary | ICD-10-CM

## 2024-04-30 DIAGNOSIS — I5022 Chronic systolic (congestive) heart failure: Secondary | ICD-10-CM

## 2024-04-30 DIAGNOSIS — Z95811 Presence of heart assist device: Secondary | ICD-10-CM

## 2024-05-06 ENCOUNTER — Ambulatory Visit (HOSPITAL_COMMUNITY): Payer: Self-pay | Admitting: Pharmacist

## 2024-05-06 LAB — POCT INR: INR: 1.9 — AB (ref 2.0–3.0)

## 2024-05-07 ENCOUNTER — Other Ambulatory Visit (HOSPITAL_COMMUNITY): Payer: Self-pay | Admitting: Internal Medicine

## 2024-05-07 DIAGNOSIS — Z95811 Presence of heart assist device: Secondary | ICD-10-CM

## 2024-05-07 DIAGNOSIS — I5022 Chronic systolic (congestive) heart failure: Secondary | ICD-10-CM

## 2024-05-09 ENCOUNTER — Other Ambulatory Visit (HOSPITAL_COMMUNITY): Payer: Self-pay | Admitting: Internal Medicine

## 2024-05-09 DIAGNOSIS — I5022 Chronic systolic (congestive) heart failure: Secondary | ICD-10-CM

## 2024-05-09 DIAGNOSIS — Z95811 Presence of heart assist device: Secondary | ICD-10-CM

## 2024-05-13 ENCOUNTER — Ambulatory Visit (HOSPITAL_COMMUNITY): Payer: Self-pay | Admitting: Pharmacist

## 2024-05-13 LAB — POCT INR: INR: 2.1 (ref 2.0–3.0)

## 2024-05-14 ENCOUNTER — Ambulatory Visit (HOSPITAL_COMMUNITY)

## 2024-05-16 ENCOUNTER — Telehealth (HOSPITAL_COMMUNITY): Payer: Self-pay | Admitting: Unknown Physician Specialty

## 2024-05-16 NOTE — Telephone Encounter (Signed)

## 2024-05-17 ENCOUNTER — Other Ambulatory Visit (HOSPITAL_COMMUNITY): Payer: Self-pay | Admitting: *Deleted

## 2024-05-17 DIAGNOSIS — Z95811 Presence of heart assist device: Secondary | ICD-10-CM

## 2024-05-17 DIAGNOSIS — Z79899 Other long term (current) drug therapy: Secondary | ICD-10-CM

## 2024-05-17 DIAGNOSIS — Z7901 Long term (current) use of anticoagulants: Secondary | ICD-10-CM

## 2024-05-17 DIAGNOSIS — I5022 Chronic systolic (congestive) heart failure: Secondary | ICD-10-CM

## 2024-05-20 ENCOUNTER — Ambulatory Visit (HOSPITAL_COMMUNITY): Payer: Self-pay | Admitting: Pharmacist

## 2024-05-20 ENCOUNTER — Ambulatory Visit (HOSPITAL_COMMUNITY): Admitting: Internal Medicine

## 2024-05-20 LAB — POCT INR: INR: 2 (ref 2.0–3.0)

## 2024-05-21 ENCOUNTER — Ambulatory Visit (HOSPITAL_COMMUNITY): Admitting: Internal Medicine

## 2024-05-22 ENCOUNTER — Telehealth: Payer: Self-pay

## 2024-05-22 ENCOUNTER — Ambulatory Visit

## 2024-05-22 DIAGNOSIS — I255 Ischemic cardiomyopathy: Secondary | ICD-10-CM

## 2024-05-22 LAB — CUP PACEART REMOTE DEVICE CHECK
Battery Remaining Longevity: 130 mo
Battery Voltage: 3.02 V
Brady Statistic RV Percent Paced: 31.7 %
Date Time Interrogation Session: 20260127213735
HighPow Impedance: 69 Ohm
Implantable Lead Connection Status: 753985
Implantable Lead Connection Status: 753985
Implantable Lead Connection Status: 753985
Implantable Lead Connection Status: 753985
Implantable Lead Implant Date: 20180409
Implantable Lead Implant Date: 20180716
Implantable Lead Implant Date: 20180716
Implantable Lead Implant Date: 20250129
Implantable Lead Location: 753858
Implantable Lead Location: 753858
Implantable Lead Location: 753859
Implantable Lead Location: 753860
Implantable Lead Model: 5076
Implantable Lead Model: 511212
Implantable Lead Model: 511212
Implantable Lead Serial Number: 263773
Implantable Lead Serial Number: 263775
Implantable Pulse Generator Implant Date: 20250129
Lead Channel Impedance Value: 323 Ohm
Lead Channel Impedance Value: 380 Ohm
Lead Channel Impedance Value: 380 Ohm
Lead Channel Pacing Threshold Amplitude: 0.625 V
Lead Channel Pacing Threshold Amplitude: 0.625 V
Lead Channel Pacing Threshold Pulse Width: 0.4 ms
Lead Channel Pacing Threshold Pulse Width: 0.4 ms
Lead Channel Sensing Intrinsic Amplitude: 3 mV
Lead Channel Sensing Intrinsic Amplitude: 9.4 mV
Lead Channel Setting Pacing Amplitude: 1.5 V
Lead Channel Setting Pacing Amplitude: 2 V
Lead Channel Setting Pacing Pulse Width: 0.4 ms
Lead Channel Setting Sensing Sensitivity: 0.3 mV
Zone Setting Status: 755011
Zone Setting Status: 755011

## 2024-05-22 NOTE — Telephone Encounter (Signed)
 ICD Scheduled remote reviewed. Normal device function.  Presenting rhythm: AS/VS Hx of PAF, overall controlled rates, burden 39.7%, Warfarin per PA report  1 VT event 1/11 @ 07:57, duration 8sec HR 176, V>A, pace terminated with 1 burst of ATP   Spoke with patient (through wife).  They do not remember anything abnormal for patient that day.  Deny any acute illness, change in medication or diet and no missed doses of meds (in particular mexiletine/amiodarone ).  Patient does not drive.   Patient to see Dr. Cherrie on 06/03/24 with labs at that time.  Dr. Inocencio to review transmission.  EP scheduler to reach out to set up EP follow up for later in February.    Forwarding to providers as FYI and if anything further in interim.

## 2024-05-23 ENCOUNTER — Ambulatory Visit: Payer: Self-pay | Admitting: Cardiology

## 2024-05-23 ENCOUNTER — Other Ambulatory Visit (HOSPITAL_COMMUNITY): Payer: Self-pay | Admitting: Internal Medicine

## 2024-05-23 DIAGNOSIS — I5022 Chronic systolic (congestive) heart failure: Secondary | ICD-10-CM

## 2024-05-23 DIAGNOSIS — Z95811 Presence of heart assist device: Secondary | ICD-10-CM

## 2024-05-24 ENCOUNTER — Ambulatory Visit (HOSPITAL_COMMUNITY): Admitting: Internal Medicine

## 2024-05-26 ENCOUNTER — Encounter (HOSPITAL_COMMUNITY): Payer: Self-pay

## 2024-05-26 NOTE — Progress Notes (Signed)
 Received page from pts wife reporting pt alarming when connecting to MPU. Troubleshooted different scenarios with pt and wife [name] to investigate issue. Pt describes MPU power cord being fully engaged and green power icon on when plugged in. No other icons illuminated. Pt continues to get power disconnect alarms or no external power alarms when changing over to wall power. MPU assessed by this [facility] Coordinator in [facility] last week with no reported issues. Pt advised to stay on battery power until loaner MPU can be provided. Given inclement weather VAD Coordinator will assess options for pick up/delivery to ensure pt safety. Pt and wife verbalize understanding. Advised to be mindful of battery life using battery gauge and rotate batteries through charger frequently to ensure adequate supply.

## 2024-05-27 ENCOUNTER — Telehealth (HOSPITAL_COMMUNITY): Payer: Self-pay

## 2024-05-27 ENCOUNTER — Other Ambulatory Visit (HOSPITAL_COMMUNITY): Payer: Self-pay

## 2024-05-27 ENCOUNTER — Ambulatory Visit (HOSPITAL_COMMUNITY): Payer: Self-pay | Admitting: Pharmacist

## 2024-05-27 LAB — POCT INR: INR: 2.1 (ref 2.0–3.0)

## 2024-05-30 NOTE — Progress Notes (Signed)
 Remote ICD Transmission

## 2024-06-03 ENCOUNTER — Ambulatory Visit (HOSPITAL_COMMUNITY): Admitting: Internal Medicine

## 2024-06-18 ENCOUNTER — Ambulatory Visit: Admitting: Internal Medicine

## 2024-06-20 ENCOUNTER — Ambulatory Visit: Admitting: Student

## 2024-08-21 ENCOUNTER — Encounter

## 2024-11-20 ENCOUNTER — Encounter

## 2025-02-19 ENCOUNTER — Encounter

## 2025-05-21 ENCOUNTER — Encounter
